# Patient Record
Sex: Female | Born: 1940 | Race: White | Hispanic: No | Smoking: Former smoker
Health system: Southern US, Community
[De-identification: ages and names within clinical notes are randomized; demographics above are authoritative.]

## PROBLEM LIST (undated history)

## (undated) DIAGNOSIS — J189 Pneumonia, unspecified organism: Secondary | ICD-10-CM

## (undated) DIAGNOSIS — Z9889 Other specified postprocedural states: Secondary | ICD-10-CM

## (undated) DIAGNOSIS — E669 Obesity, unspecified: Secondary | ICD-10-CM

## (undated) DIAGNOSIS — T4145XA Adverse effect of unspecified anesthetic, initial encounter: Secondary | ICD-10-CM

## (undated) DIAGNOSIS — I5030 Unspecified diastolic (congestive) heart failure: Secondary | ICD-10-CM

## (undated) DIAGNOSIS — R300 Dysuria: Principal | ICD-10-CM

## (undated) DIAGNOSIS — R112 Nausea with vomiting, unspecified: Secondary | ICD-10-CM

## (undated) DIAGNOSIS — R0602 Shortness of breath: Secondary | ICD-10-CM

## (undated) DIAGNOSIS — M7500 Adhesive capsulitis of unspecified shoulder: Secondary | ICD-10-CM

## (undated) DIAGNOSIS — E875 Hyperkalemia: Principal | ICD-10-CM

## (undated) DIAGNOSIS — D631 Anemia in chronic kidney disease: Secondary | ICD-10-CM

## (undated) DIAGNOSIS — R519 Headache, unspecified: Secondary | ICD-10-CM

## (undated) DIAGNOSIS — M353 Polymyalgia rheumatica: Secondary | ICD-10-CM

## (undated) DIAGNOSIS — N189 Chronic kidney disease, unspecified: Secondary | ICD-10-CM

## (undated) DIAGNOSIS — I639 Cerebral infarction, unspecified: Secondary | ICD-10-CM

## (undated) DIAGNOSIS — I2510 Atherosclerotic heart disease of native coronary artery without angina pectoris: Secondary | ICD-10-CM

## (undated) DIAGNOSIS — N184 Chronic kidney disease, stage 4 (severe): Secondary | ICD-10-CM

## (undated) DIAGNOSIS — E785 Hyperlipidemia, unspecified: Secondary | ICD-10-CM

## (undated) DIAGNOSIS — N179 Acute kidney failure, unspecified: Secondary | ICD-10-CM

## (undated) DIAGNOSIS — N289 Disorder of kidney and ureter, unspecified: Secondary | ICD-10-CM

## (undated) DIAGNOSIS — A419 Sepsis, unspecified organism: Secondary | ICD-10-CM

## (undated) DIAGNOSIS — R0603 Acute respiratory distress: Secondary | ICD-10-CM

## (undated) DIAGNOSIS — K219 Gastro-esophageal reflux disease without esophagitis: Secondary | ICD-10-CM

## (undated) DIAGNOSIS — F259 Schizoaffective disorder, unspecified: Secondary | ICD-10-CM

## (undated) DIAGNOSIS — R51 Headache: Secondary | ICD-10-CM

## (undated) DIAGNOSIS — I700 Atherosclerosis of aorta: Secondary | ICD-10-CM

## (undated) DIAGNOSIS — J449 Chronic obstructive pulmonary disease, unspecified: Secondary | ICD-10-CM

## (undated) DIAGNOSIS — E039 Hypothyroidism, unspecified: Secondary | ICD-10-CM

## (undated) DIAGNOSIS — K8689 Other specified diseases of pancreas: Secondary | ICD-10-CM

## (undated) DIAGNOSIS — D472 Monoclonal gammopathy: Secondary | ICD-10-CM

## (undated) DIAGNOSIS — T8859XA Other complications of anesthesia, initial encounter: Secondary | ICD-10-CM

## (undated) DIAGNOSIS — Y95 Nosocomial condition: Secondary | ICD-10-CM

## (undated) HISTORY — PX: CHOLECYSTECTOMY: SHX55

## (undated) HISTORY — DX: Gastro-esophageal reflux disease without esophagitis: K21.9

## (undated) HISTORY — DX: Nosocomial condition: Y95

## (undated) HISTORY — DX: Dysuria: R30.0

## (undated) HISTORY — DX: Chronic kidney disease, unspecified: N18.9

## (undated) HISTORY — DX: Acute respiratory distress: R06.03

## (undated) HISTORY — PX: CATARACT EXTRACTION: SUR2

## (undated) HISTORY — DX: Schizoaffective disorder, unspecified: F25.9

## (undated) HISTORY — DX: Atherosclerotic heart disease of native coronary artery without angina pectoris: I25.10

## (undated) HISTORY — DX: Anemia in chronic kidney disease: D63.1

## (undated) HISTORY — DX: Monoclonal gammopathy: D47.2

## (undated) HISTORY — DX: Pneumonia, unspecified organism: J18.9

## (undated) HISTORY — DX: Unspecified diastolic (congestive) heart failure: I50.30

## (undated) HISTORY — PX: ABDOMINAL HYSTERECTOMY: SHX81

## (undated) HISTORY — DX: Sepsis, unspecified organism: A41.9

## (undated) HISTORY — DX: Hyperlipidemia, unspecified: E78.5

## (undated) HISTORY — DX: Hyperkalemia: E87.5

## (undated) HISTORY — DX: Hypothyroidism, unspecified: E03.9

## (undated) HISTORY — DX: Acute kidney failure, unspecified: N17.9

## (undated) HISTORY — DX: Adhesive capsulitis of unspecified shoulder: M75.00

---

## 1993-06-14 HISTORY — PX: PARTIAL HYSTERECTOMY: SHX80

## 1998-02-28 ENCOUNTER — Encounter: Admission: RE | Admit: 1998-02-28 | Discharge: 1998-02-28 | Payer: Self-pay | Admitting: Family Medicine

## 1998-03-26 ENCOUNTER — Encounter: Admission: RE | Admit: 1998-03-26 | Discharge: 1998-03-26 | Payer: Self-pay | Admitting: Family Medicine

## 1998-06-03 ENCOUNTER — Encounter: Admission: RE | Admit: 1998-06-03 | Discharge: 1998-06-03 | Payer: Self-pay | Admitting: Sports Medicine

## 1998-07-03 ENCOUNTER — Encounter: Admission: RE | Admit: 1998-07-03 | Discharge: 1998-07-03 | Payer: Self-pay | Admitting: Family Medicine

## 1998-09-09 ENCOUNTER — Encounter: Admission: RE | Admit: 1998-09-09 | Discharge: 1998-09-09 | Payer: Self-pay | Admitting: Family Medicine

## 1998-12-26 ENCOUNTER — Encounter: Admission: RE | Admit: 1998-12-26 | Discharge: 1998-12-26 | Payer: Self-pay | Admitting: Family Medicine

## 1999-01-06 ENCOUNTER — Encounter: Admission: RE | Admit: 1999-01-06 | Discharge: 1999-01-06 | Payer: Self-pay | Admitting: Sports Medicine

## 1999-02-18 ENCOUNTER — Encounter: Admission: RE | Admit: 1999-02-18 | Discharge: 1999-02-18 | Payer: Self-pay | Admitting: Family Medicine

## 1999-04-22 ENCOUNTER — Encounter: Admission: RE | Admit: 1999-04-22 | Discharge: 1999-04-22 | Payer: Self-pay | Admitting: Family Medicine

## 1999-05-27 ENCOUNTER — Encounter: Admission: RE | Admit: 1999-05-27 | Discharge: 1999-05-27 | Payer: Self-pay | Admitting: Family Medicine

## 1999-09-22 ENCOUNTER — Encounter: Admission: RE | Admit: 1999-09-22 | Discharge: 1999-09-22 | Payer: Self-pay | Admitting: *Deleted

## 1999-09-22 ENCOUNTER — Encounter: Payer: Self-pay | Admitting: *Deleted

## 2000-03-14 ENCOUNTER — Encounter: Admission: RE | Admit: 2000-03-14 | Discharge: 2000-03-14 | Payer: Self-pay | Admitting: Family Medicine

## 2000-03-23 ENCOUNTER — Encounter: Admission: RE | Admit: 2000-03-23 | Discharge: 2000-03-23 | Payer: Self-pay | Admitting: Pediatrics

## 2000-07-20 ENCOUNTER — Encounter: Admission: RE | Admit: 2000-07-20 | Discharge: 2000-07-20 | Payer: Self-pay | Admitting: Family Medicine

## 2000-09-20 ENCOUNTER — Encounter: Admission: RE | Admit: 2000-09-20 | Discharge: 2000-09-20 | Payer: Self-pay | Admitting: Family Medicine

## 2000-10-19 ENCOUNTER — Encounter: Admission: RE | Admit: 2000-10-19 | Discharge: 2000-10-19 | Payer: Self-pay | Admitting: *Deleted

## 2000-10-19 ENCOUNTER — Encounter: Payer: Self-pay | Admitting: *Deleted

## 2000-11-25 ENCOUNTER — Encounter: Admission: RE | Admit: 2000-11-25 | Discharge: 2000-11-25 | Payer: Self-pay | Admitting: Family Medicine

## 2000-12-29 ENCOUNTER — Encounter: Admission: RE | Admit: 2000-12-29 | Discharge: 2000-12-29 | Payer: Self-pay | Admitting: Family Medicine

## 2001-01-17 ENCOUNTER — Encounter: Admission: RE | Admit: 2001-01-17 | Discharge: 2001-01-17 | Payer: Self-pay | Admitting: Family Medicine

## 2001-02-08 ENCOUNTER — Encounter: Admission: RE | Admit: 2001-02-08 | Discharge: 2001-02-08 | Payer: Self-pay | Admitting: Family Medicine

## 2001-03-02 ENCOUNTER — Encounter: Admission: RE | Admit: 2001-03-02 | Discharge: 2001-03-02 | Payer: Self-pay | Admitting: Family Medicine

## 2001-05-30 ENCOUNTER — Encounter: Admission: RE | Admit: 2001-05-30 | Discharge: 2001-05-30 | Payer: Self-pay | Admitting: Sports Medicine

## 2001-06-27 ENCOUNTER — Encounter: Admission: RE | Admit: 2001-06-27 | Discharge: 2001-06-27 | Payer: Self-pay | Admitting: Family Medicine

## 2001-08-23 ENCOUNTER — Inpatient Hospital Stay (HOSPITAL_COMMUNITY): Admission: EM | Admit: 2001-08-23 | Discharge: 2001-08-28 | Payer: Self-pay | Admitting: *Deleted

## 2001-08-29 ENCOUNTER — Encounter: Admission: RE | Admit: 2001-08-29 | Discharge: 2001-08-29 | Payer: Self-pay | Admitting: Sports Medicine

## 2001-09-27 ENCOUNTER — Encounter: Admission: RE | Admit: 2001-09-27 | Discharge: 2001-09-27 | Payer: Self-pay | Admitting: Family Medicine

## 2001-11-02 ENCOUNTER — Encounter: Admission: RE | Admit: 2001-11-02 | Discharge: 2001-11-02 | Payer: Self-pay | Admitting: Family Medicine

## 2002-02-05 ENCOUNTER — Encounter: Admission: RE | Admit: 2002-02-05 | Discharge: 2002-02-05 | Payer: Self-pay | Admitting: Family Medicine

## 2002-02-19 ENCOUNTER — Encounter: Payer: Self-pay | Admitting: Sports Medicine

## 2002-02-19 ENCOUNTER — Encounter: Admission: RE | Admit: 2002-02-19 | Discharge: 2002-02-19 | Payer: Self-pay | Admitting: Sports Medicine

## 2002-02-20 ENCOUNTER — Encounter: Admission: RE | Admit: 2002-02-20 | Discharge: 2002-02-20 | Payer: Self-pay | Admitting: Family Medicine

## 2002-02-26 ENCOUNTER — Encounter: Admission: RE | Admit: 2002-02-26 | Discharge: 2002-02-26 | Payer: Self-pay | Admitting: Family Medicine

## 2002-03-01 ENCOUNTER — Encounter: Admission: RE | Admit: 2002-03-01 | Discharge: 2002-03-01 | Payer: Self-pay | Admitting: Family Medicine

## 2002-03-02 ENCOUNTER — Ambulatory Visit (HOSPITAL_COMMUNITY): Admission: RE | Admit: 2002-03-02 | Discharge: 2002-03-02 | Payer: Self-pay | Admitting: *Deleted

## 2002-03-02 ENCOUNTER — Encounter: Payer: Self-pay | Admitting: *Deleted

## 2002-03-23 ENCOUNTER — Encounter: Admission: RE | Admit: 2002-03-23 | Discharge: 2002-03-23 | Payer: Self-pay | Admitting: Family Medicine

## 2002-04-11 ENCOUNTER — Encounter: Admission: RE | Admit: 2002-04-11 | Discharge: 2002-04-11 | Payer: Self-pay | Admitting: Sports Medicine

## 2002-04-19 ENCOUNTER — Encounter: Admission: RE | Admit: 2002-04-19 | Discharge: 2002-04-19 | Payer: Self-pay | Admitting: Sports Medicine

## 2002-04-24 ENCOUNTER — Encounter: Admission: RE | Admit: 2002-04-24 | Discharge: 2002-04-24 | Payer: Self-pay | Admitting: Family Medicine

## 2002-05-02 ENCOUNTER — Encounter: Admission: RE | Admit: 2002-05-02 | Discharge: 2002-05-02 | Payer: Self-pay | Admitting: Family Medicine

## 2002-05-15 ENCOUNTER — Encounter: Admission: RE | Admit: 2002-05-15 | Discharge: 2002-05-15 | Payer: Self-pay | Admitting: Family Medicine

## 2002-05-18 ENCOUNTER — Encounter: Admission: RE | Admit: 2002-05-18 | Discharge: 2002-05-18 | Payer: Self-pay | Admitting: Family Medicine

## 2002-05-21 ENCOUNTER — Encounter: Admission: RE | Admit: 2002-05-21 | Discharge: 2002-05-21 | Payer: Self-pay | Admitting: Sports Medicine

## 2002-05-21 ENCOUNTER — Encounter: Payer: Self-pay | Admitting: Sports Medicine

## 2002-05-21 ENCOUNTER — Inpatient Hospital Stay (HOSPITAL_COMMUNITY): Admission: AD | Admit: 2002-05-21 | Discharge: 2002-05-25 | Payer: Self-pay | Admitting: Surgery

## 2002-05-21 ENCOUNTER — Encounter: Payer: Self-pay | Admitting: Surgery

## 2002-05-22 ENCOUNTER — Encounter (INDEPENDENT_AMBULATORY_CARE_PROVIDER_SITE_OTHER): Payer: Self-pay | Admitting: Specialist

## 2002-05-30 ENCOUNTER — Encounter: Admission: RE | Admit: 2002-05-30 | Discharge: 2002-05-30 | Payer: Self-pay | Admitting: Family Medicine

## 2002-06-16 ENCOUNTER — Encounter: Payer: Self-pay | Admitting: General Surgery

## 2002-06-16 ENCOUNTER — Inpatient Hospital Stay (HOSPITAL_COMMUNITY): Admission: EM | Admit: 2002-06-16 | Discharge: 2002-06-22 | Payer: Self-pay | Admitting: *Deleted

## 2002-06-16 ENCOUNTER — Encounter: Payer: Self-pay | Admitting: *Deleted

## 2002-06-19 ENCOUNTER — Encounter: Payer: Self-pay | Admitting: Surgery

## 2002-06-27 ENCOUNTER — Encounter: Admission: RE | Admit: 2002-06-27 | Discharge: 2002-06-27 | Payer: Self-pay | Admitting: Family Medicine

## 2002-06-28 ENCOUNTER — Encounter: Admission: RE | Admit: 2002-06-28 | Discharge: 2002-06-28 | Payer: Self-pay | Admitting: Family Medicine

## 2002-07-02 ENCOUNTER — Encounter: Admission: RE | Admit: 2002-07-02 | Discharge: 2002-07-02 | Payer: Self-pay | Admitting: Family Medicine

## 2002-08-15 ENCOUNTER — Encounter: Admission: RE | Admit: 2002-08-15 | Discharge: 2002-08-15 | Payer: Self-pay | Admitting: Family Medicine

## 2002-09-12 ENCOUNTER — Encounter: Admission: RE | Admit: 2002-09-12 | Discharge: 2002-09-12 | Payer: Self-pay | Admitting: Family Medicine

## 2002-10-03 ENCOUNTER — Encounter: Admission: RE | Admit: 2002-10-03 | Discharge: 2002-10-03 | Payer: Self-pay | Admitting: Family Medicine

## 2003-01-17 ENCOUNTER — Encounter: Admission: RE | Admit: 2003-01-17 | Discharge: 2003-01-17 | Payer: Self-pay | Admitting: Family Medicine

## 2003-02-26 ENCOUNTER — Encounter: Admission: RE | Admit: 2003-02-26 | Discharge: 2003-02-26 | Payer: Self-pay | Admitting: Sports Medicine

## 2003-04-03 ENCOUNTER — Encounter: Admission: RE | Admit: 2003-04-03 | Discharge: 2003-04-03 | Payer: Self-pay | Admitting: Sports Medicine

## 2003-04-03 ENCOUNTER — Encounter: Payer: Self-pay | Admitting: Sports Medicine

## 2003-04-04 ENCOUNTER — Encounter: Admission: RE | Admit: 2003-04-04 | Discharge: 2003-04-04 | Payer: Self-pay | Admitting: Family Medicine

## 2003-04-24 ENCOUNTER — Encounter: Admission: RE | Admit: 2003-04-24 | Discharge: 2003-04-24 | Payer: Self-pay | Admitting: Family Medicine

## 2003-04-26 ENCOUNTER — Encounter: Admission: RE | Admit: 2003-04-26 | Discharge: 2003-04-26 | Payer: Self-pay | Admitting: Family Medicine

## 2003-05-03 ENCOUNTER — Encounter: Admission: RE | Admit: 2003-05-03 | Discharge: 2003-05-03 | Payer: Self-pay | Admitting: Sports Medicine

## 2003-05-07 ENCOUNTER — Encounter: Admission: RE | Admit: 2003-05-07 | Discharge: 2003-05-07 | Payer: Self-pay | Admitting: Family Medicine

## 2003-06-12 ENCOUNTER — Encounter: Admission: RE | Admit: 2003-06-12 | Discharge: 2003-06-12 | Payer: Self-pay | Admitting: Family Medicine

## 2003-07-12 ENCOUNTER — Encounter: Admission: RE | Admit: 2003-07-12 | Discharge: 2003-07-12 | Payer: Self-pay | Admitting: Sports Medicine

## 2003-07-12 ENCOUNTER — Ambulatory Visit (HOSPITAL_COMMUNITY): Admission: RE | Admit: 2003-07-12 | Discharge: 2003-07-12 | Payer: Self-pay | Admitting: Sports Medicine

## 2003-07-23 ENCOUNTER — Encounter: Admission: RE | Admit: 2003-07-23 | Discharge: 2003-07-23 | Payer: Self-pay | Admitting: Family Medicine

## 2003-09-20 ENCOUNTER — Encounter: Admission: RE | Admit: 2003-09-20 | Discharge: 2003-09-20 | Payer: Self-pay | Admitting: Family Medicine

## 2003-10-05 ENCOUNTER — Emergency Department (HOSPITAL_COMMUNITY): Admission: EM | Admit: 2003-10-05 | Discharge: 2003-10-05 | Payer: Self-pay | Admitting: Emergency Medicine

## 2003-10-08 ENCOUNTER — Encounter: Admission: RE | Admit: 2003-10-08 | Discharge: 2003-10-08 | Payer: Self-pay | Admitting: Sports Medicine

## 2003-10-09 ENCOUNTER — Encounter: Admission: RE | Admit: 2003-10-09 | Discharge: 2003-10-09 | Payer: Self-pay | Admitting: Family Medicine

## 2003-10-11 ENCOUNTER — Encounter: Admission: RE | Admit: 2003-10-11 | Discharge: 2003-10-11 | Payer: Self-pay | Admitting: Family Medicine

## 2003-10-11 ENCOUNTER — Encounter: Admission: RE | Admit: 2003-10-11 | Discharge: 2003-10-11 | Payer: Self-pay | Admitting: Sports Medicine

## 2003-11-07 ENCOUNTER — Encounter: Admission: RE | Admit: 2003-11-07 | Discharge: 2003-11-07 | Payer: Self-pay | Admitting: Family Medicine

## 2004-02-27 ENCOUNTER — Ambulatory Visit: Payer: Self-pay | Admitting: Family Medicine

## 2004-04-06 ENCOUNTER — Encounter: Admission: RE | Admit: 2004-04-06 | Discharge: 2004-04-06 | Payer: Self-pay | Admitting: Sports Medicine

## 2004-04-28 ENCOUNTER — Ambulatory Visit: Payer: Self-pay | Admitting: Family Medicine

## 2004-05-11 ENCOUNTER — Encounter: Admission: RE | Admit: 2004-05-11 | Discharge: 2004-05-19 | Payer: Self-pay | Admitting: Family Medicine

## 2004-05-16 IMAGING — CT CT ABDOMEN W/O CM
1 series · 15 of 32 positions shown, 19 images · non-contrast
Comparison: none

FINDINGS
CLINICAL DATA: VOMITING, ABDOMINAL PAIN.  STATUS POST EMERGENT CHOLECYSTECTOMY.
CT SCAN OF THE ABDOMEN, WITHOUT CONTRAST
SPIRAL SCANNING IS PERFORMED AFTER DILUTE GASTROINTESTINAL CONTRAST WAS ADMINISTERED  PER THE NG
TUBE.  NO IV CONTRAST WAS UTILIZED.
LUNG BASES SHOW MINIMAL DEPENDENT ATELECTASIS. NO PLEURAL OR PERICARDIAL  FLUID. LIVER PARENCHYMA
APPEARS NORMAL. THE PATIENT HAS HAD CHOLECYSTECTOMY IN THE RECENT PAST.  THERE IS SOME MATERIAL IN
THE GALLBLADDER BED CONSISTENT WITH RESOLVING POST-OPERATIVE CHANGE.  NO FREE FLUID OR AIR IN THE
PERONEAL SPACE.  THE SPLEEN, PANCREAS, AND ADRENAL GLANDS ARE NORMAL. THE KIDNEYS SHOW NO FOCAL
LESION IN THE UNCONTRASTED STATE.  THE SMALL BOWEL IS DILATED AND FLUID FILLED. THERE IS ALSO A
GOOD BIT OF FLUID AND AIR WITHIN THE COLON.  THE DISTAL SMALL BOWEL DOES IN FACT HAVE A MUCH
SMALLER CALIBER.  THIS FINDING CAN BE SEEN IN ILEUS BUT DOES RAISE CONCERN ABOUT THE POSSIBILITY OF
A PARTIAL SMALL BOWEL OBSTRUCTION.
IMPRESSION
1.  DILATED PROXIMAL SMALL BOWEL WITH A NORMAL CALIBER DISTAL SMALL BOWEL.  WHEREAS THIS FINDING
CAN OCCASIONALLY BE SEEN IN ILEUS, IT RAISES CONCERN ABOUT PARTIAL SMALL BOWEL OBSTRUCTION.
CT PELVIS,  WITHOUT CONTRAST
5 MM SCANS ARE MADE AFTER GASTROINTESTINAL CONTRAST BUT WITHOUT INTRAVENOUS CONTRAST.
THERE IS NO FREE FLUID.  THERE IS A FOLEY CATHETER IN THE BLADDER.  ONE CAN APPRECIATE DECOMPRESSED
DISTAL COLON AND SMALL CALIBER DISTAL SMALL BOWEL.
1.  NEGATIVE CT SCAN OF THE PELVIS.

[Series 2: abd pelvis · axial · 0.70mm/px · z∈[-475,-70]mm · 15 of 90 slices shown, 19 images]
[im 6/90  soft-tissue]
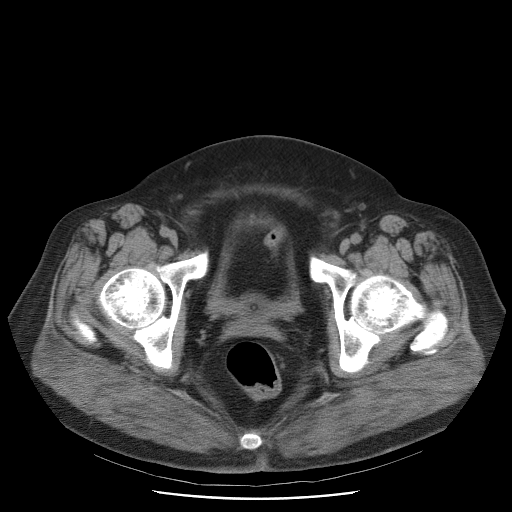
[im 6/90  bone]
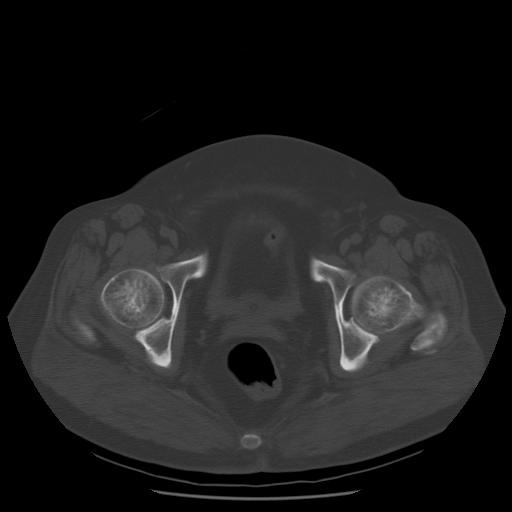
[im 12/90  soft-tissue]
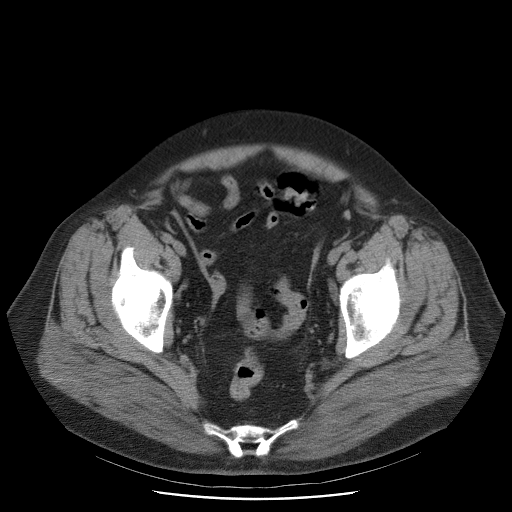
[im 18/90  soft-tissue]
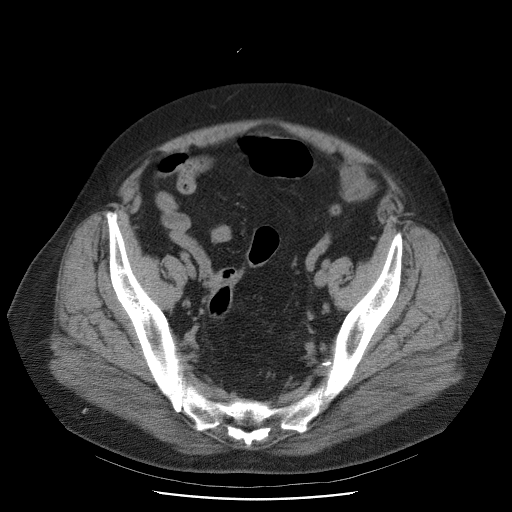
[im 26/90  soft-tissue]
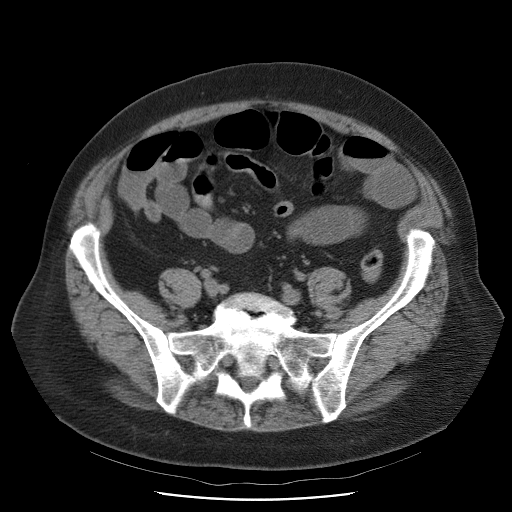
[im 32/90  soft-tissue]
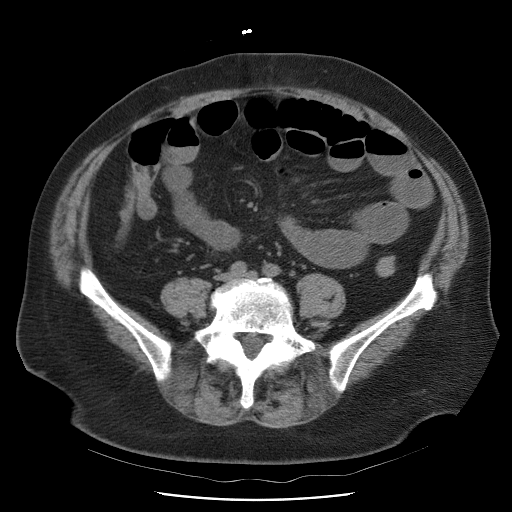
[im 38/90  soft-tissue]
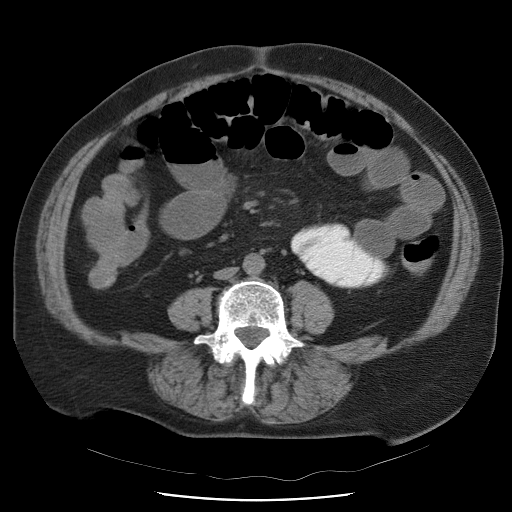
[im 46/90  soft-tissue]
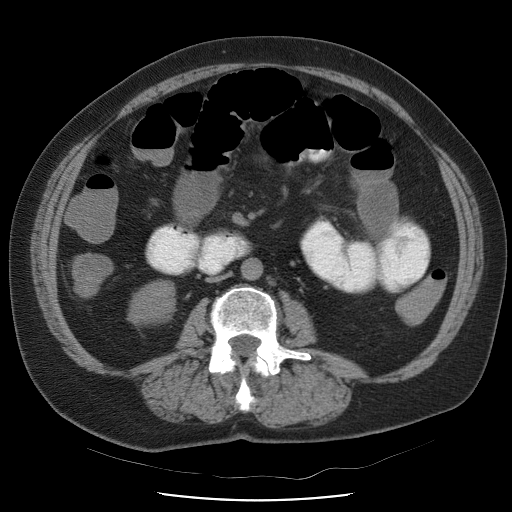
[im 52/90  soft-tissue]
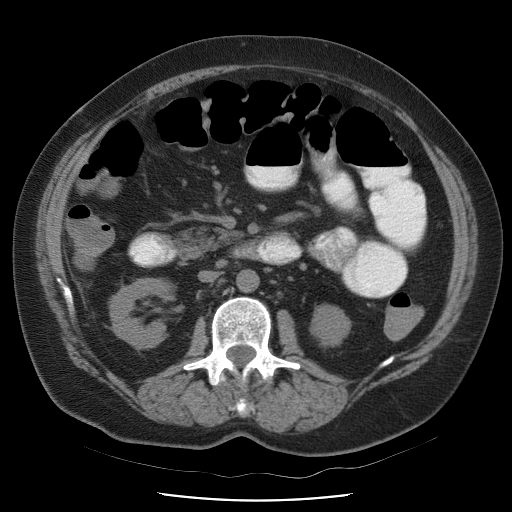
[im 58/90  soft-tissue]
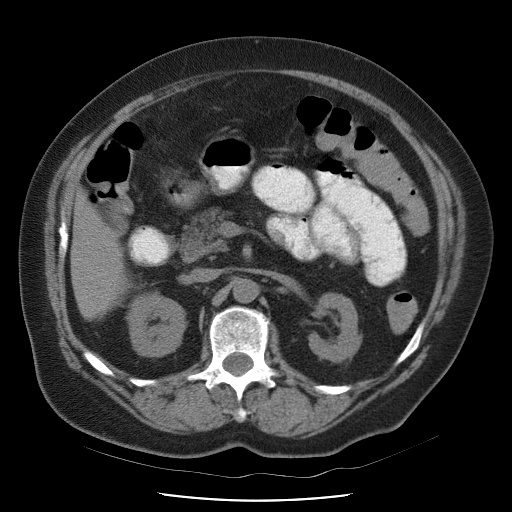
[im 58/90  bone]
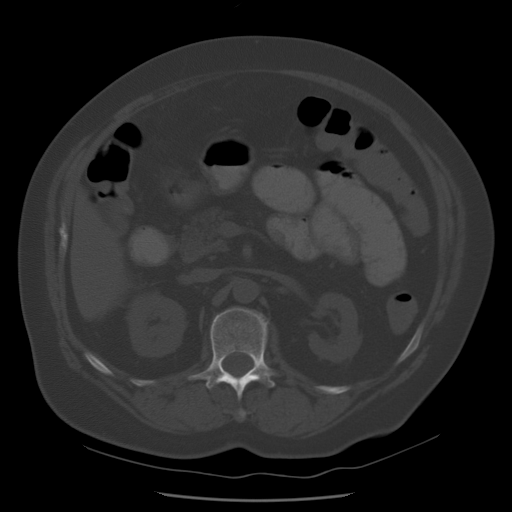
[im 64/90  soft-tissue]
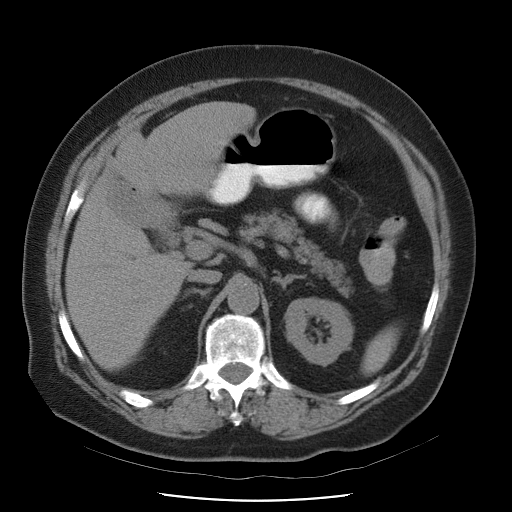
[im 72/90  soft-tissue]
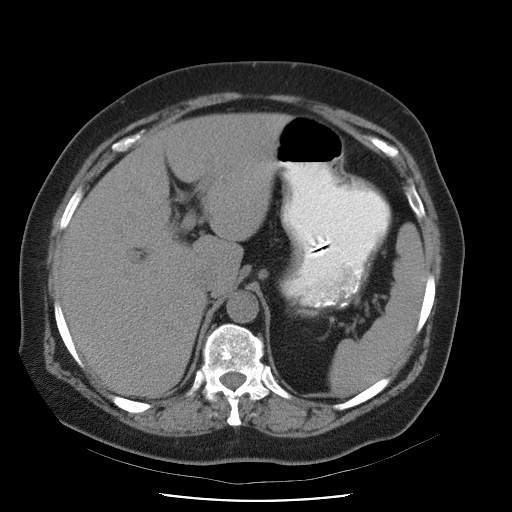
[im 78/90  soft-tissue]
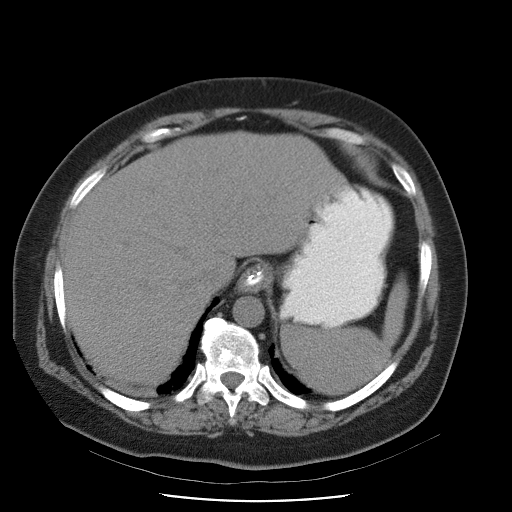
[im 78/90  lung]
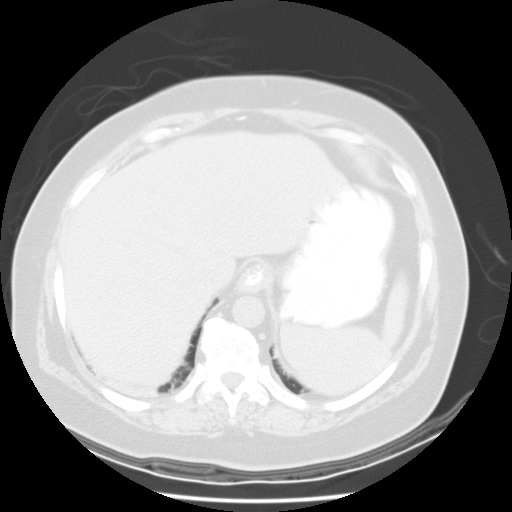
[im 81/90  lung]
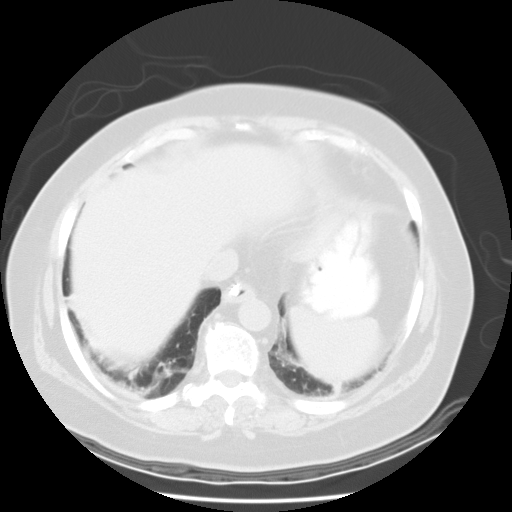
[im 84/90  soft-tissue]
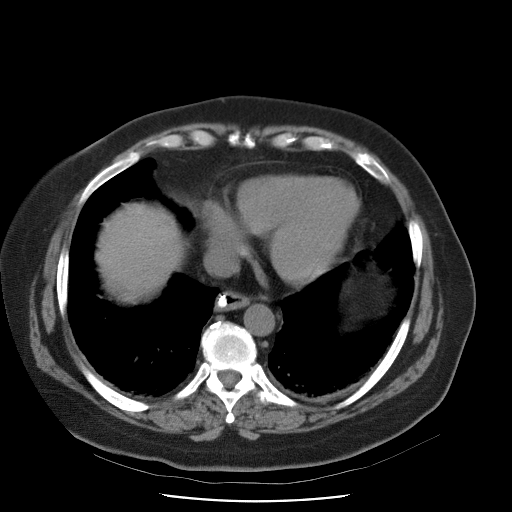
[im 84/90  lung]
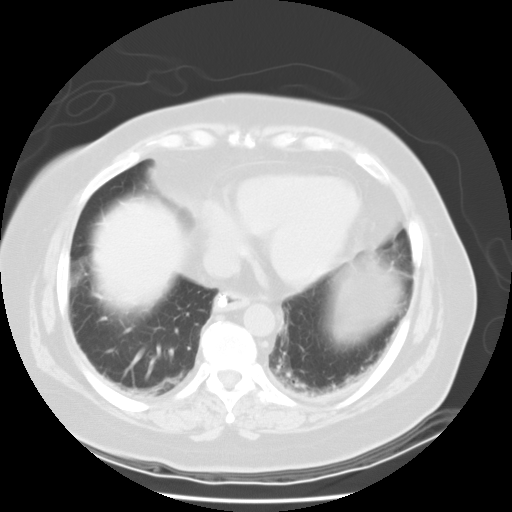
[im 87/90  lung]
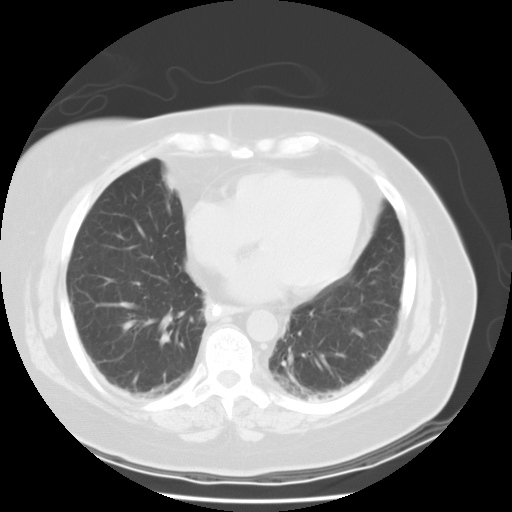

[15 of 32 positions shown; findings below may reference images not displayed]

## 2004-06-01 ENCOUNTER — Ambulatory Visit: Payer: Self-pay | Admitting: Sports Medicine

## 2004-06-14 DIAGNOSIS — I2510 Atherosclerotic heart disease of native coronary artery without angina pectoris: Secondary | ICD-10-CM

## 2004-06-14 HISTORY — DX: Atherosclerotic heart disease of native coronary artery without angina pectoris: I25.10

## 2004-06-14 HISTORY — PX: CORONARY ANGIOPLASTY WITH STENT PLACEMENT: SHX49

## 2004-08-18 ENCOUNTER — Ambulatory Visit: Payer: Self-pay | Admitting: Family Medicine

## 2004-09-11 ENCOUNTER — Ambulatory Visit: Payer: Self-pay | Admitting: Family Medicine

## 2004-10-22 ENCOUNTER — Ambulatory Visit (HOSPITAL_COMMUNITY): Admission: RE | Admit: 2004-10-22 | Discharge: 2004-10-22 | Payer: Self-pay | Admitting: Family Medicine

## 2004-10-22 ENCOUNTER — Encounter (INDEPENDENT_AMBULATORY_CARE_PROVIDER_SITE_OTHER): Payer: Self-pay | Admitting: Specialist

## 2004-12-18 ENCOUNTER — Ambulatory Visit: Payer: Self-pay | Admitting: Family Medicine

## 2004-12-28 ENCOUNTER — Ambulatory Visit: Payer: Self-pay | Admitting: Sports Medicine

## 2004-12-28 ENCOUNTER — Ambulatory Visit (HOSPITAL_COMMUNITY): Admission: RE | Admit: 2004-12-28 | Discharge: 2004-12-28 | Payer: Self-pay | Admitting: Sports Medicine

## 2004-12-30 ENCOUNTER — Ambulatory Visit: Payer: Self-pay | Admitting: Family Medicine

## 2005-01-06 ENCOUNTER — Ambulatory Visit: Payer: Self-pay | Admitting: Sports Medicine

## 2005-02-08 ENCOUNTER — Inpatient Hospital Stay (HOSPITAL_COMMUNITY): Admission: AD | Admit: 2005-02-08 | Discharge: 2005-02-10 | Payer: Self-pay | Admitting: *Deleted

## 2005-02-13 ENCOUNTER — Emergency Department (HOSPITAL_COMMUNITY): Admission: EM | Admit: 2005-02-13 | Discharge: 2005-02-13 | Payer: Self-pay | Admitting: Emergency Medicine

## 2005-03-16 ENCOUNTER — Ambulatory Visit: Payer: Self-pay | Admitting: Family Medicine

## 2005-03-24 ENCOUNTER — Ambulatory Visit: Payer: Self-pay | Admitting: Family Medicine

## 2005-03-25 ENCOUNTER — Encounter: Admission: RE | Admit: 2005-03-25 | Discharge: 2005-03-25 | Payer: Self-pay | Admitting: Family Medicine

## 2005-03-25 ENCOUNTER — Ambulatory Visit: Payer: Self-pay | Admitting: Sports Medicine

## 2005-04-28 ENCOUNTER — Ambulatory Visit: Payer: Self-pay | Admitting: Family Medicine

## 2005-08-05 ENCOUNTER — Encounter: Admission: RE | Admit: 2005-08-05 | Discharge: 2005-08-05 | Payer: Self-pay | Admitting: Family Medicine

## 2005-09-10 IMAGING — US US ABDOMEN COMPLETE
1 series · 14 of 25 positions shown · non-contrast
Comparison: none

CLINICAL DATA: Right upper quadrant pain.  Urinary retention. 
 COMPLETE ABDOMINAL ULTRASOUND: 
 The gallbladder has been removed.  Common bile duct measures 6.3mm in maximum diameter, within normal limits for a post cholecystectomy patient.  There is slight increased echogenicity of the liver parenchyma diffusely suggesting mild fatty infiltration.  The inferior vena cava, pancreas, spleen, kidneys, and abdominal aorta all appear normal.  Right kidney is 10.9cm in length and the left kidney is 10.9cm in length.  
 The bladder measured 7.9 x 4.1 x 8.7cm consistent with 600cc.  On the post voiding study the bladder was completely empty.

[Series 1: unknown · 0.27mm/px · 14 of 66 slices shown]
[im 1/66]
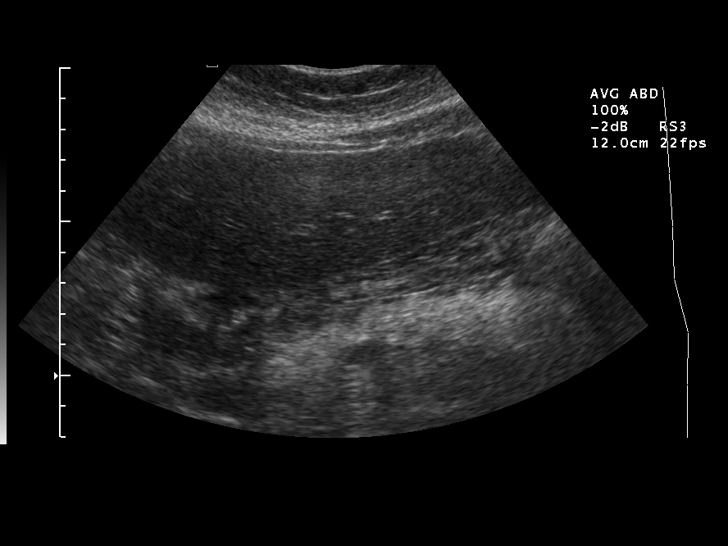
[im 6/66]
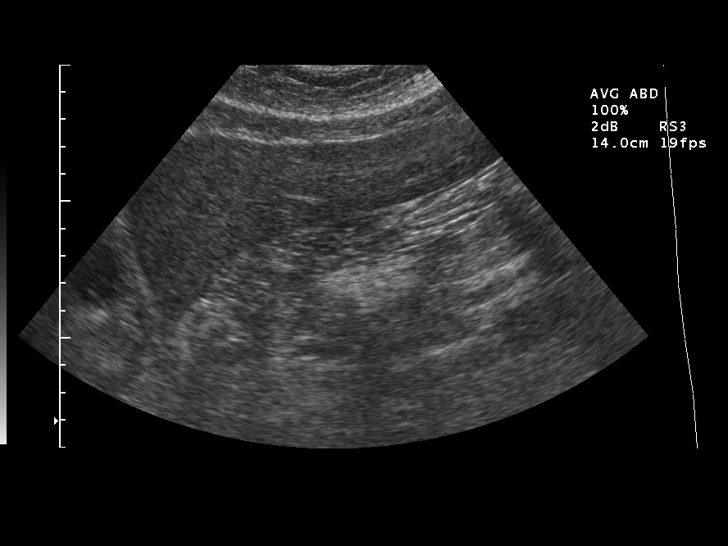
[im 11/66]
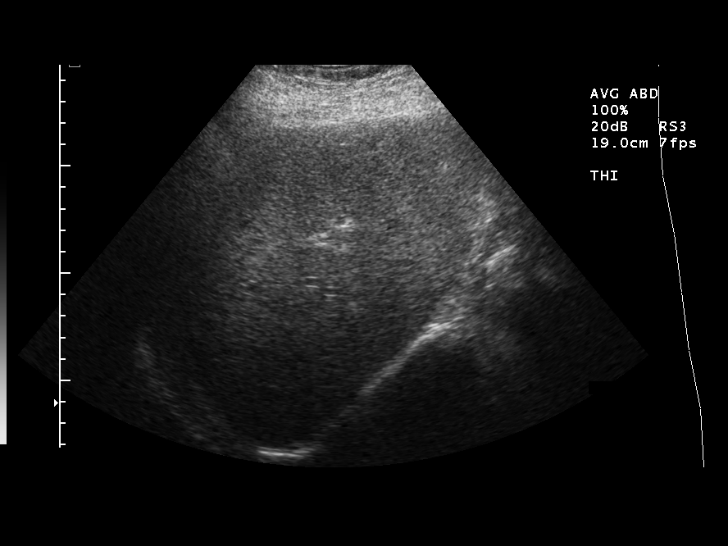
[im 17/66]
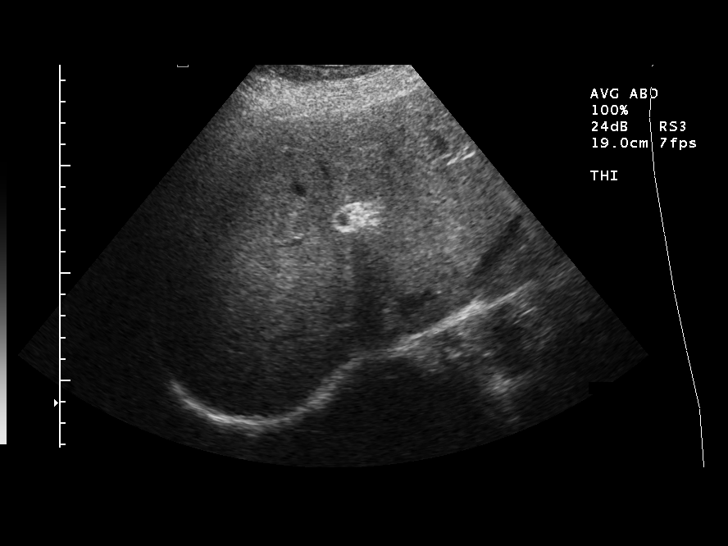
[im 22/66]
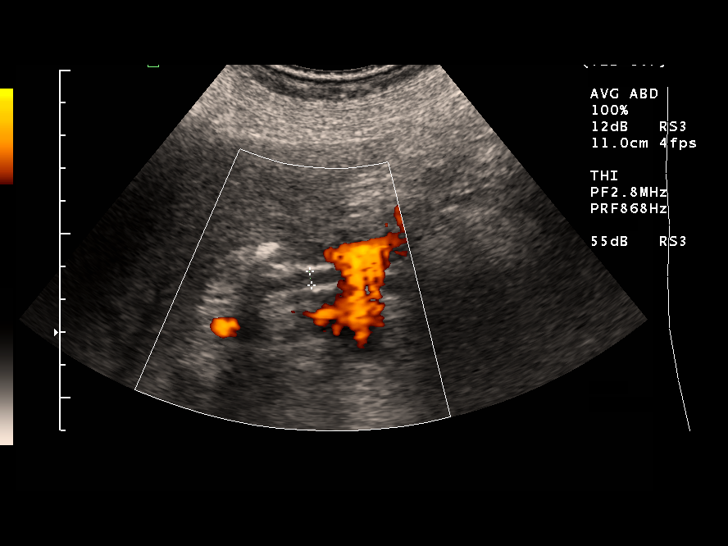
[im 25/66]
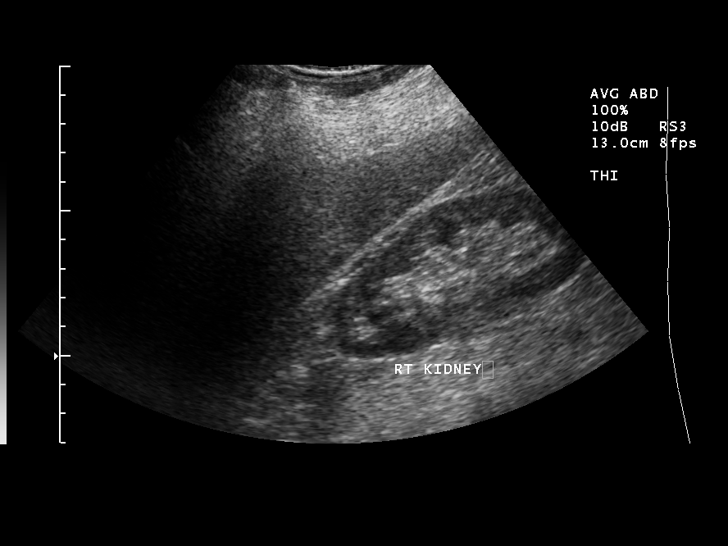
[im 30/66]
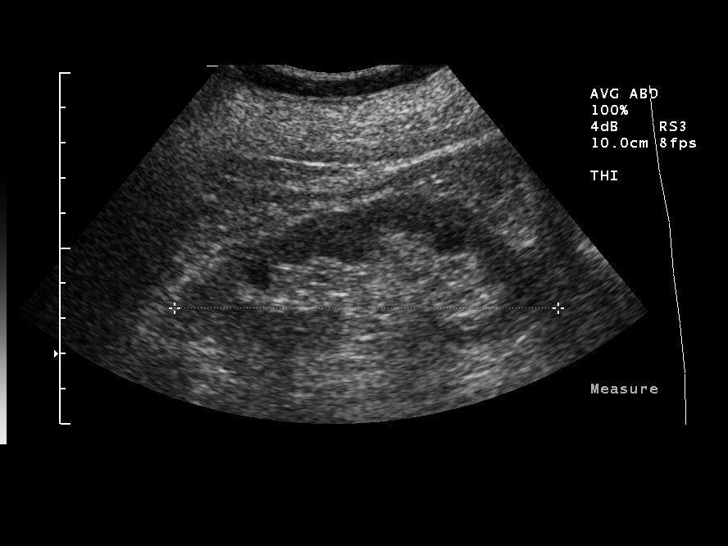
[im 36/66]
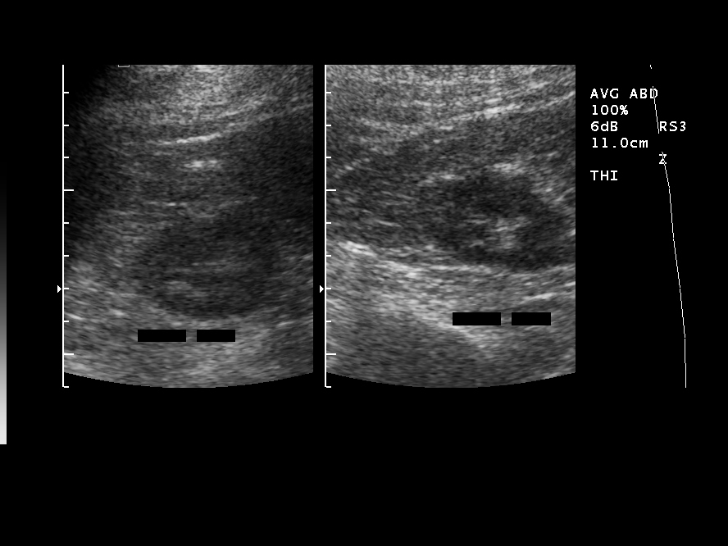
[im 41/66]
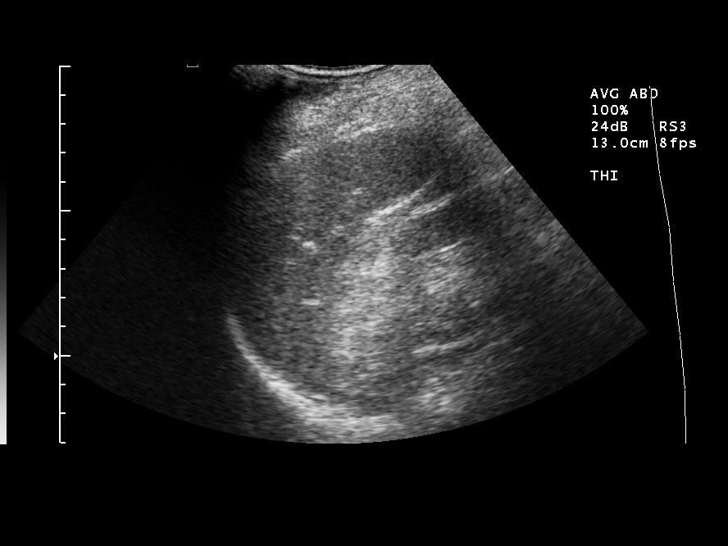
[im 44/66]
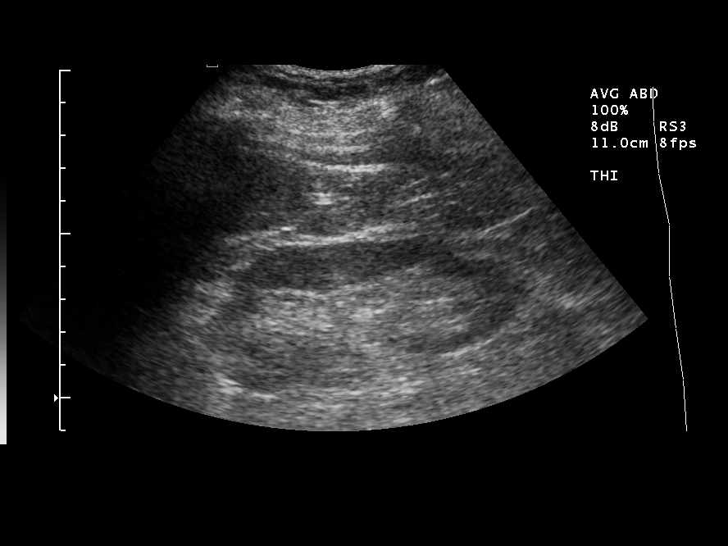
[im 49/66]
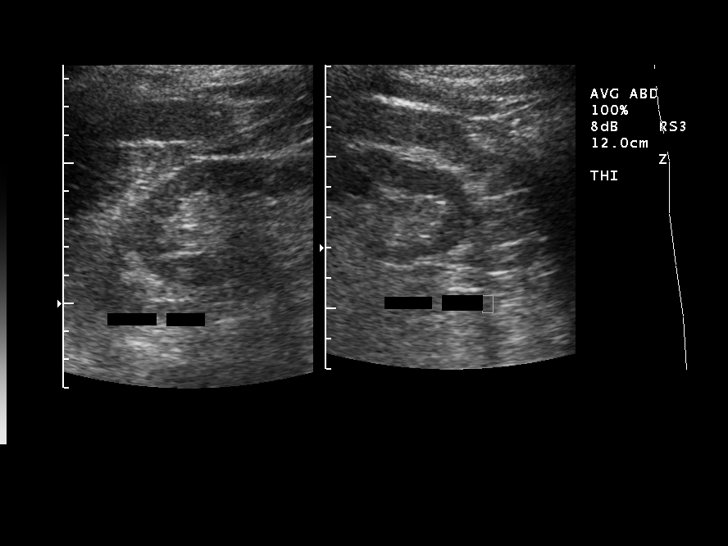
[im 55/66]
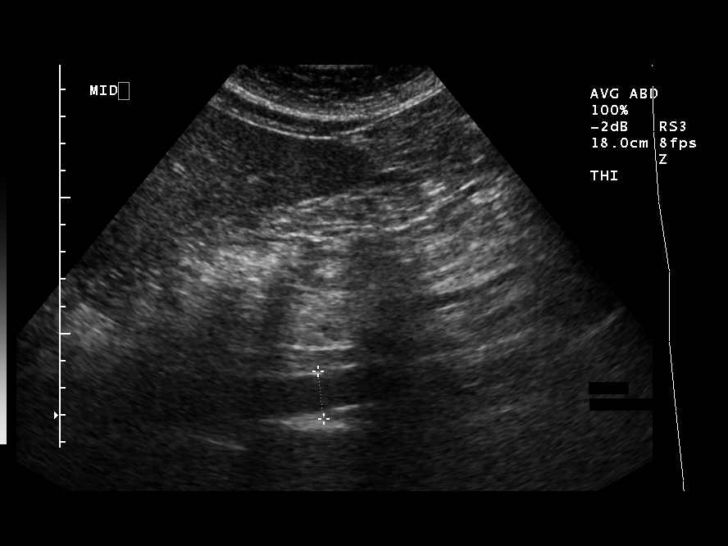
[im 60/66]
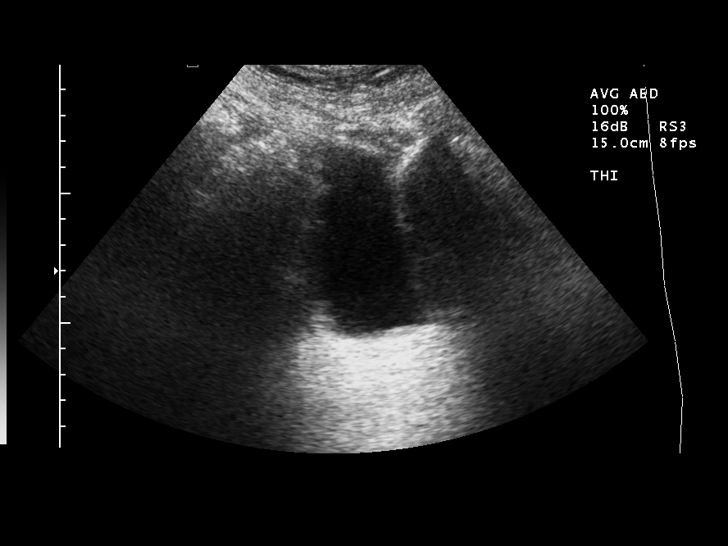
[im 66/66]
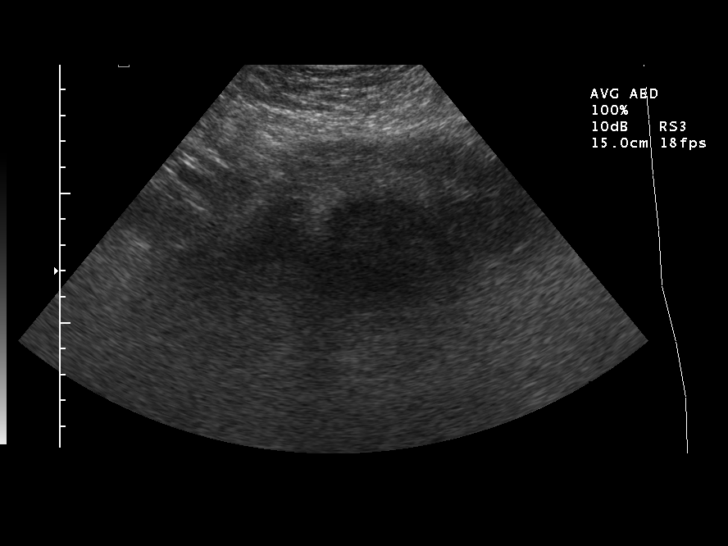

[14 of 25 positions shown; findings below may reference images not displayed]

IMPRESSION: 1.  Slightly fatty liver.  Previous cholecystectomy. 
 2.  No appreciable residual urine in the bladder after voiding.

## 2005-09-17 ENCOUNTER — Encounter: Admission: RE | Admit: 2005-09-17 | Discharge: 2005-09-17 | Payer: Self-pay | Admitting: Sports Medicine

## 2005-09-24 ENCOUNTER — Ambulatory Visit: Payer: Self-pay | Admitting: Family Medicine

## 2005-10-22 ENCOUNTER — Ambulatory Visit: Payer: Self-pay | Admitting: Family Medicine

## 2006-01-05 ENCOUNTER — Ambulatory Visit: Payer: Self-pay | Admitting: Sports Medicine

## 2006-01-05 ENCOUNTER — Ambulatory Visit (HOSPITAL_COMMUNITY): Admission: RE | Admit: 2006-01-05 | Discharge: 2006-01-05 | Payer: Self-pay | Admitting: Family Medicine

## 2006-01-26 ENCOUNTER — Ambulatory Visit: Payer: Self-pay

## 2006-03-10 ENCOUNTER — Ambulatory Visit: Payer: Self-pay | Admitting: Sports Medicine

## 2006-03-15 ENCOUNTER — Ambulatory Visit: Payer: Self-pay | Admitting: Family Medicine

## 2006-05-24 ENCOUNTER — Ambulatory Visit: Payer: Self-pay | Admitting: Sports Medicine

## 2006-07-20 ENCOUNTER — Ambulatory Visit: Payer: Self-pay | Admitting: Internal Medicine

## 2006-07-20 ENCOUNTER — Inpatient Hospital Stay (HOSPITAL_COMMUNITY): Admission: EM | Admit: 2006-07-20 | Discharge: 2006-08-03 | Payer: Self-pay | Admitting: Emergency Medicine

## 2006-07-29 ENCOUNTER — Ambulatory Visit: Payer: Self-pay | Admitting: Physical Medicine & Rehabilitation

## 2006-08-09 ENCOUNTER — Ambulatory Visit: Payer: Self-pay | Admitting: Family Medicine

## 2006-08-09 ENCOUNTER — Encounter (INDEPENDENT_AMBULATORY_CARE_PROVIDER_SITE_OTHER): Payer: Self-pay | Admitting: Family Medicine

## 2006-08-09 LAB — CONVERTED CEMR LAB
AST: 33 units/L (ref 0–37)
Alkaline Phosphatase: 90 units/L (ref 39–117)
CO2: 19 meq/L (ref 19–32)
Calcium: 9.5 mg/dL (ref 8.4–10.5)
Creatinine, Ser: 2.2 mg/dL — ABNORMAL HIGH (ref 0.40–1.20)
Glucose, Bld: 108 mg/dL — ABNORMAL HIGH (ref 70–99)
Potassium: 5.2 meq/L (ref 3.5–5.3)
Total CK: 293 units/L — ABNORMAL HIGH (ref 7–177)

## 2006-08-11 DIAGNOSIS — N189 Chronic kidney disease, unspecified: Secondary | ICD-10-CM

## 2006-08-11 DIAGNOSIS — E039 Hypothyroidism, unspecified: Secondary | ICD-10-CM | POA: Insufficient documentation

## 2006-08-11 DIAGNOSIS — E1165 Type 2 diabetes mellitus with hyperglycemia: Secondary | ICD-10-CM

## 2006-08-11 DIAGNOSIS — E118 Type 2 diabetes mellitus with unspecified complications: Secondary | ICD-10-CM

## 2006-08-11 DIAGNOSIS — K219 Gastro-esophageal reflux disease without esophagitis: Secondary | ICD-10-CM

## 2006-08-11 DIAGNOSIS — D631 Anemia in chronic kidney disease: Secondary | ICD-10-CM | POA: Insufficient documentation

## 2006-08-16 ENCOUNTER — Telehealth (INDEPENDENT_AMBULATORY_CARE_PROVIDER_SITE_OTHER): Payer: Self-pay | Admitting: *Deleted

## 2006-08-17 ENCOUNTER — Encounter (HOSPITAL_COMMUNITY): Admission: RE | Admit: 2006-08-17 | Discharge: 2006-11-15 | Payer: Self-pay | Admitting: Family Medicine

## 2006-08-17 ENCOUNTER — Telehealth: Payer: Self-pay | Admitting: *Deleted

## 2006-08-18 ENCOUNTER — Ambulatory Visit: Payer: Self-pay | Admitting: Sports Medicine

## 2006-08-18 ENCOUNTER — Encounter: Payer: Self-pay | Admitting: Family Medicine

## 2006-08-18 LAB — CONVERTED CEMR LAB
Albumin: 4.2 g/dL (ref 3.5–5.2)
Blood in Urine, dipstick: NEGATIVE
CO2: 16 meq/L — ABNORMAL LOW (ref 19–32)
Calcium: 9.5 mg/dL (ref 8.4–10.5)
Potassium: 4.9 meq/L (ref 3.5–5.3)
Total CK: 238 units/L — ABNORMAL HIGH (ref 7–177)
Urobilinogen, UA: 0.2
pH: 5.5

## 2006-09-23 ENCOUNTER — Encounter (INDEPENDENT_AMBULATORY_CARE_PROVIDER_SITE_OTHER): Payer: Self-pay | Admitting: Family Medicine

## 2006-09-23 ENCOUNTER — Ambulatory Visit: Payer: Self-pay | Admitting: Family Medicine

## 2006-09-23 LAB — CONVERTED CEMR LAB: TSH: 2.272 microintl units/mL (ref 0.350–5.50)

## 2006-09-29 ENCOUNTER — Encounter: Admission: RE | Admit: 2006-09-29 | Discharge: 2006-09-29 | Payer: Self-pay | Admitting: Sports Medicine

## 2006-09-29 ENCOUNTER — Encounter (INDEPENDENT_AMBULATORY_CARE_PROVIDER_SITE_OTHER): Payer: Self-pay | Admitting: Family Medicine

## 2006-10-04 ENCOUNTER — Ambulatory Visit: Payer: Self-pay | Admitting: Family Medicine

## 2006-10-04 ENCOUNTER — Telehealth: Payer: Self-pay | Admitting: *Deleted

## 2006-10-06 ENCOUNTER — Telehealth (INDEPENDENT_AMBULATORY_CARE_PROVIDER_SITE_OTHER): Payer: Self-pay | Admitting: *Deleted

## 2006-10-10 ENCOUNTER — Ambulatory Visit: Payer: Self-pay | Admitting: Family Medicine

## 2006-10-10 ENCOUNTER — Telehealth: Payer: Self-pay | Admitting: *Deleted

## 2006-10-10 ENCOUNTER — Encounter (INDEPENDENT_AMBULATORY_CARE_PROVIDER_SITE_OTHER): Payer: Self-pay | Admitting: Family Medicine

## 2006-10-10 LAB — CONVERTED CEMR LAB
ALT: 19 units/L (ref 0–35)
Albumin: 4 g/dL (ref 3.5–5.2)
Alkaline Phosphatase: 84 units/L (ref 39–117)
BUN: 29 mg/dL — ABNORMAL HIGH (ref 6–23)
CO2: 21 meq/L (ref 19–32)
Creatinine, Ser: 1.36 mg/dL — ABNORMAL HIGH (ref 0.40–1.20)
Glucose, Bld: 98 mg/dL (ref 70–99)
Magnesium: 1.5 mg/dL (ref 1.5–2.5)
Phosphorus: 3.7 mg/dL (ref 2.3–4.6)
Total Protein: 6.8 g/dL (ref 6.0–8.3)

## 2006-10-11 ENCOUNTER — Telehealth (INDEPENDENT_AMBULATORY_CARE_PROVIDER_SITE_OTHER): Payer: Self-pay | Admitting: Family Medicine

## 2006-10-12 ENCOUNTER — Encounter (INDEPENDENT_AMBULATORY_CARE_PROVIDER_SITE_OTHER): Payer: Self-pay | Admitting: Family Medicine

## 2006-12-01 ENCOUNTER — Ambulatory Visit: Payer: Self-pay | Admitting: Family Medicine

## 2007-01-10 ENCOUNTER — Telehealth (INDEPENDENT_AMBULATORY_CARE_PROVIDER_SITE_OTHER): Payer: Self-pay | Admitting: *Deleted

## 2007-01-25 ENCOUNTER — Ambulatory Visit: Payer: Self-pay | Admitting: Family Medicine

## 2007-01-25 DIAGNOSIS — E785 Hyperlipidemia, unspecified: Secondary | ICD-10-CM | POA: Insufficient documentation

## 2007-01-25 LAB — CONVERTED CEMR LAB: Hgb A1c MFr Bld: 5.7 %

## 2007-02-23 IMAGING — CR DG CHEST 2V
2 series · 2 of 2 positions shown · non-contrast
Comparison: none

CLINICAL DATA: Cough.  Short of breath.
 CHEST ? TWO VIEWS:
 Two views of the chest show no pneumonia or effusion.  The heart is within the upper limits of normal in size.  There are degenerative changes in both shoulders.

[w chest pa]
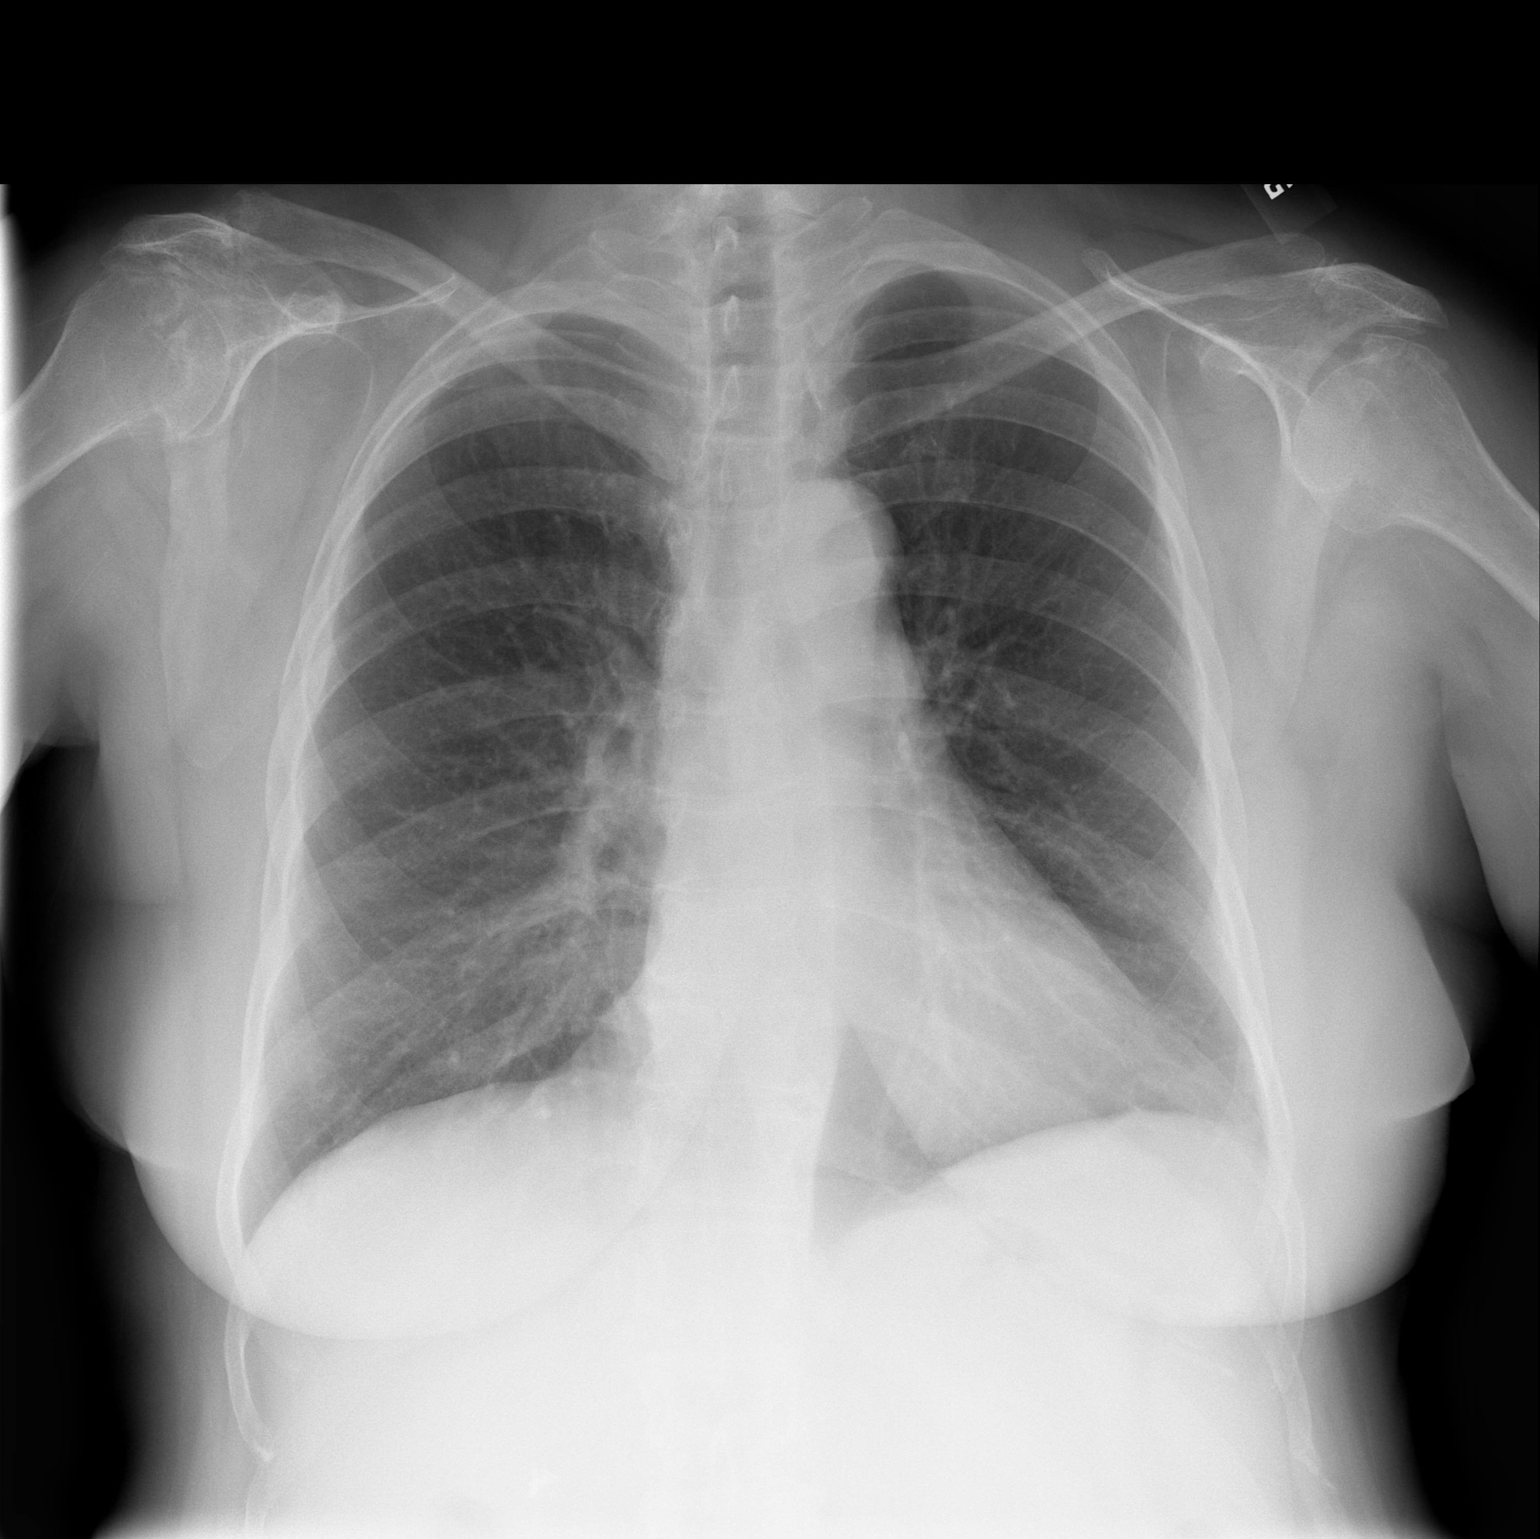

[w chest lat]
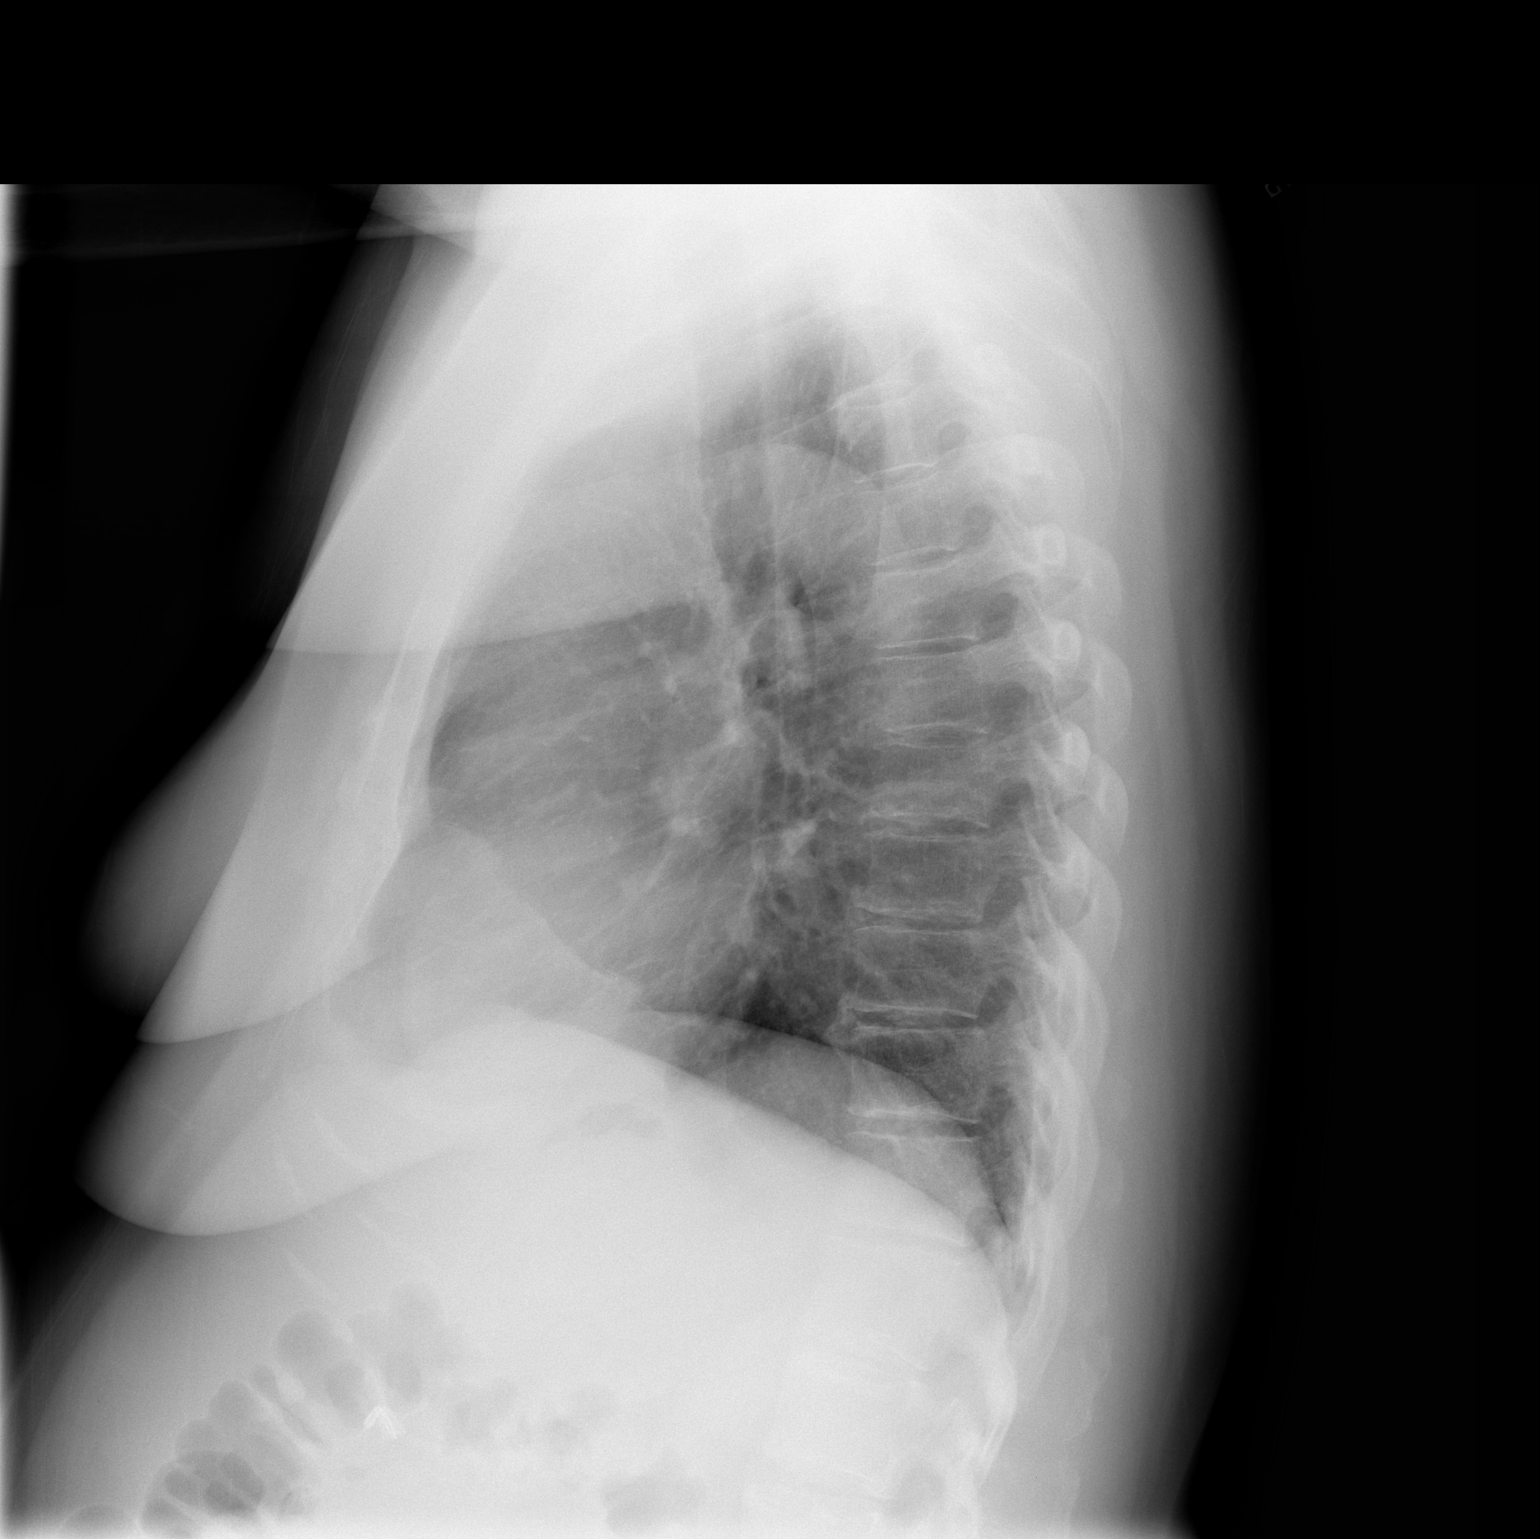

[2 of 2 positions shown; findings below may reference images not displayed]

IMPRESSION: No active lung disease.

## 2007-03-06 ENCOUNTER — Encounter (INDEPENDENT_AMBULATORY_CARE_PROVIDER_SITE_OTHER): Payer: Self-pay | Admitting: Family Medicine

## 2007-04-17 ENCOUNTER — Encounter (INDEPENDENT_AMBULATORY_CARE_PROVIDER_SITE_OTHER): Payer: Self-pay | Admitting: *Deleted

## 2007-04-17 ENCOUNTER — Ambulatory Visit: Payer: Self-pay | Admitting: Family Medicine

## 2007-07-05 ENCOUNTER — Ambulatory Visit: Payer: Self-pay | Admitting: Family Medicine

## 2007-07-05 ENCOUNTER — Ambulatory Visit (HOSPITAL_COMMUNITY): Admission: RE | Admit: 2007-07-05 | Discharge: 2007-07-05 | Payer: Self-pay | Admitting: Family Medicine

## 2007-07-05 LAB — CONVERTED CEMR LAB: LDL Goal: 70 mg/dL

## 2007-07-06 IMAGING — CR DG LUMBAR SPINE COMPLETE 4+V
5 series · 5 of 5 positions shown · non-contrast
Comparison: none

CLINICAL DATA: Low back and bilateral hip pain.  
 LUMBAR SPINE:
 Four views of the lumbar spine show degenerative disk disease at L4-5 and L5-S1.  No compression deformity is seen, and alignment is normal.  Degenerative changes also noted involving the face joints, particularly at L5-S1.  The SI joints appear normal.

[view not recorded (1 of 5)]
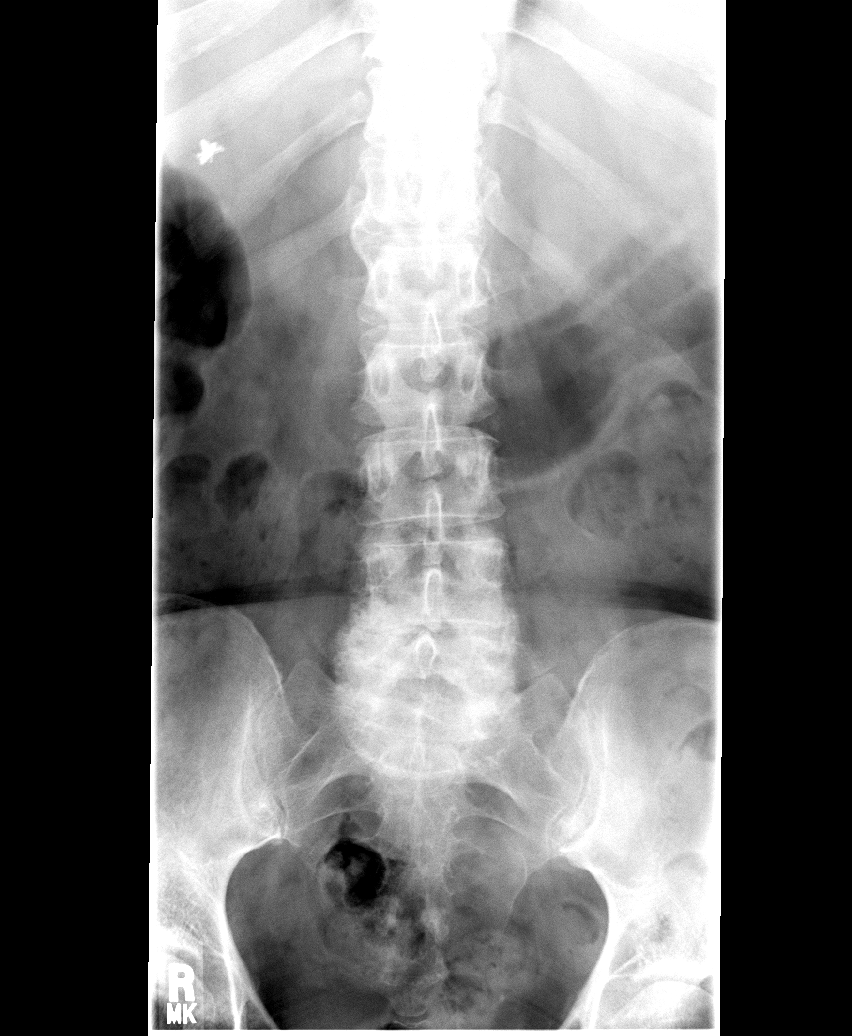

[view not recorded (2 of 5)]
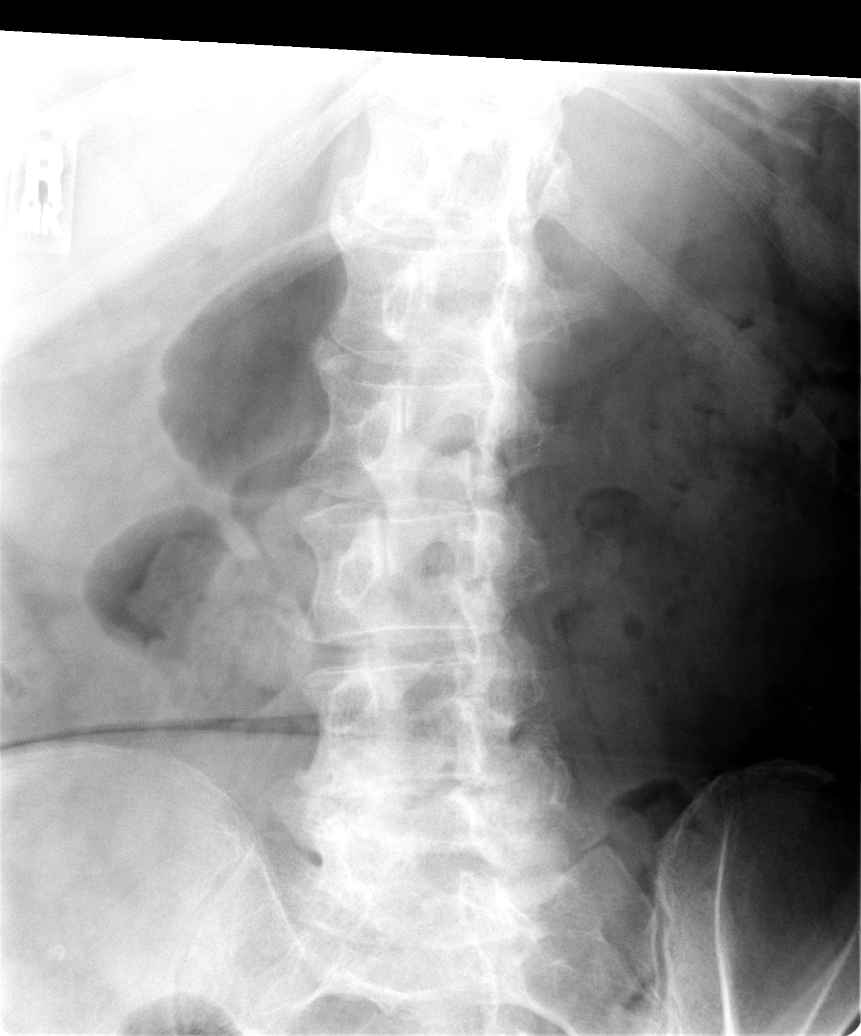

[view not recorded (3 of 5)]
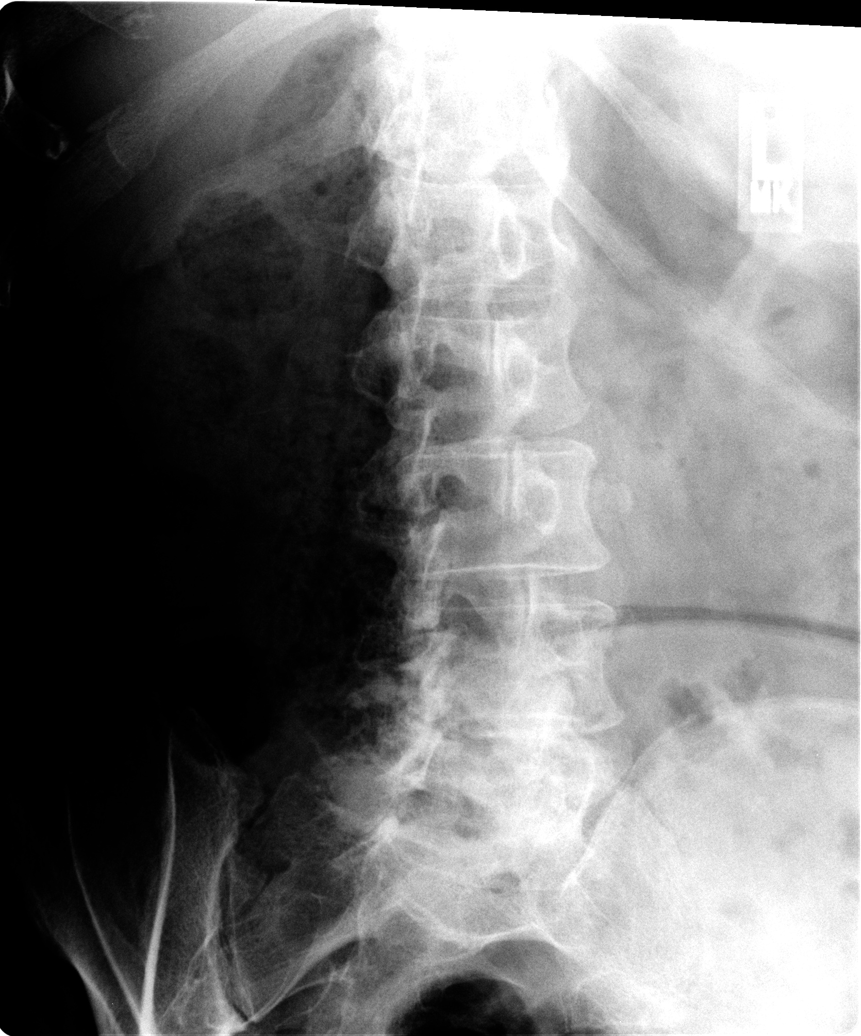

[view not recorded (4 of 5)]
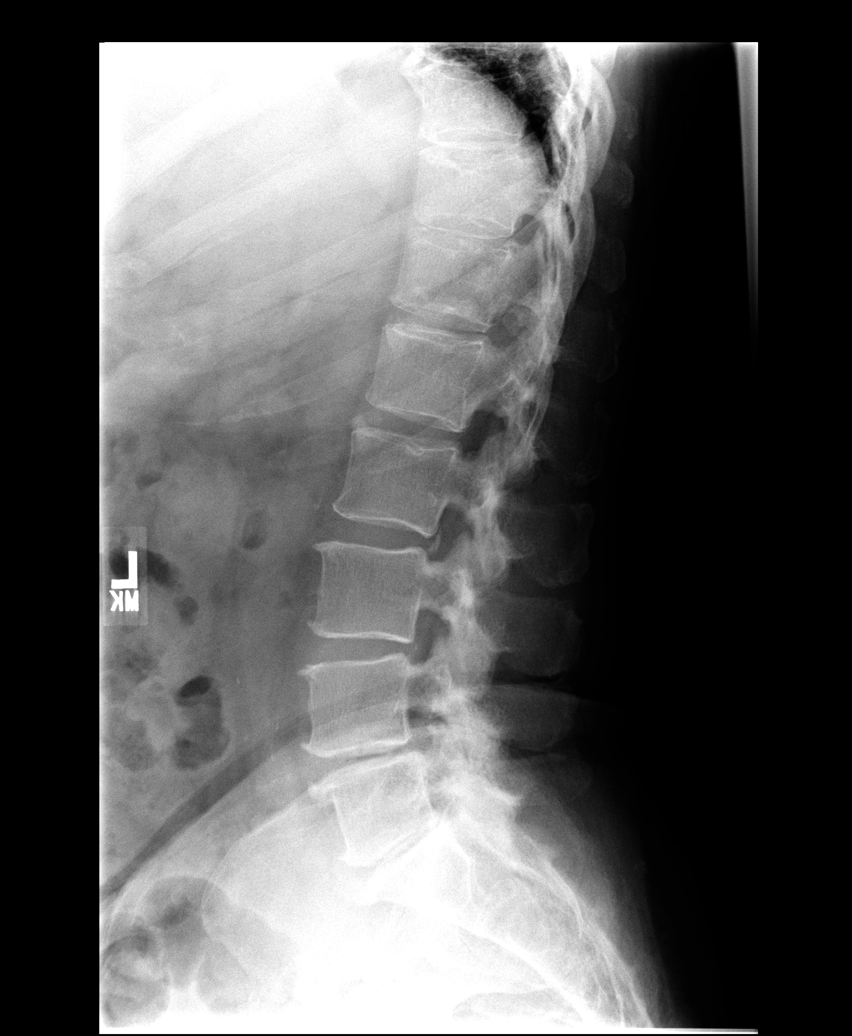

[view not recorded (5 of 5)]
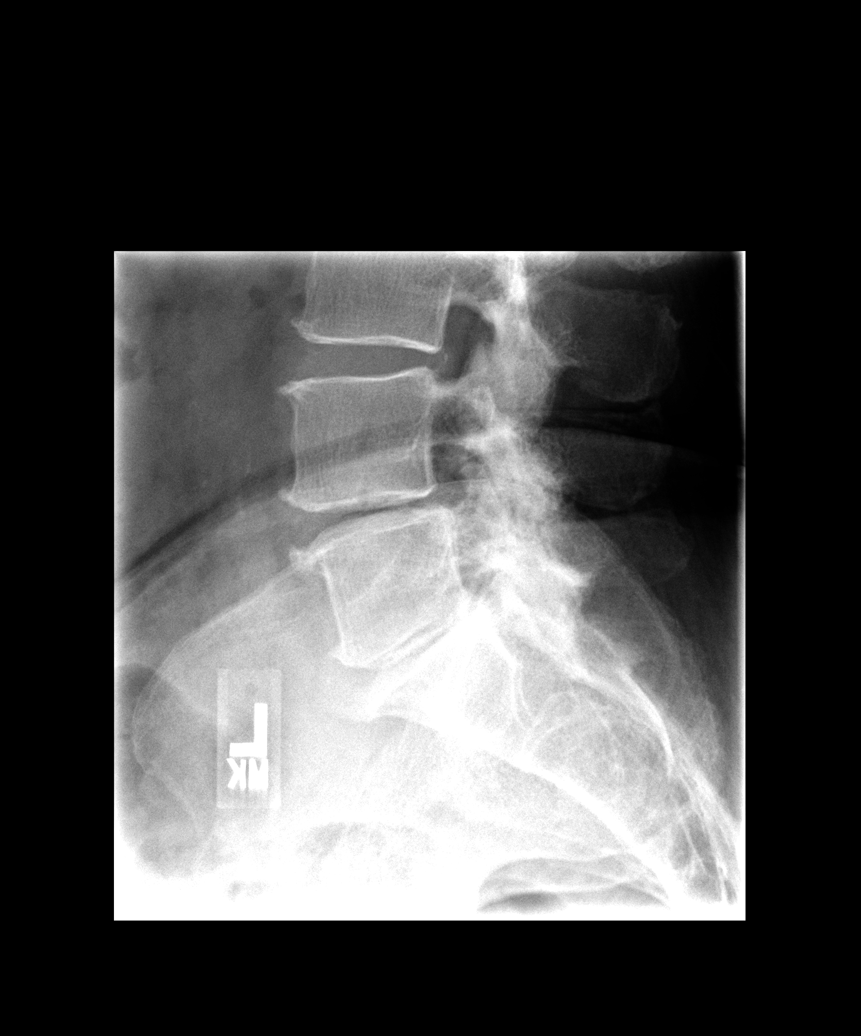

[5 of 5 positions shown; findings below may reference images not displayed]

IMPRESSION: Degenerative disk disease at L4-5 and L5-S1.  Normal alignment.

## 2007-07-06 IMAGING — CR DG HIP COMPLETE 2+V*R*
2 series · 2 of 2 positions shown · non-contrast
Comparison: none

DUPLICATE COPY for exam association in [facility] - no change from original report, 08/06/05.
CLINICAL DATA: Low back and right [REDACTED] pain.
 RIGHT HIP:
 Two views of the right hip show no acute abnormality. The right hip joint space appears normal.  The ramus is intact.

[view not recorded (1 of 2)]
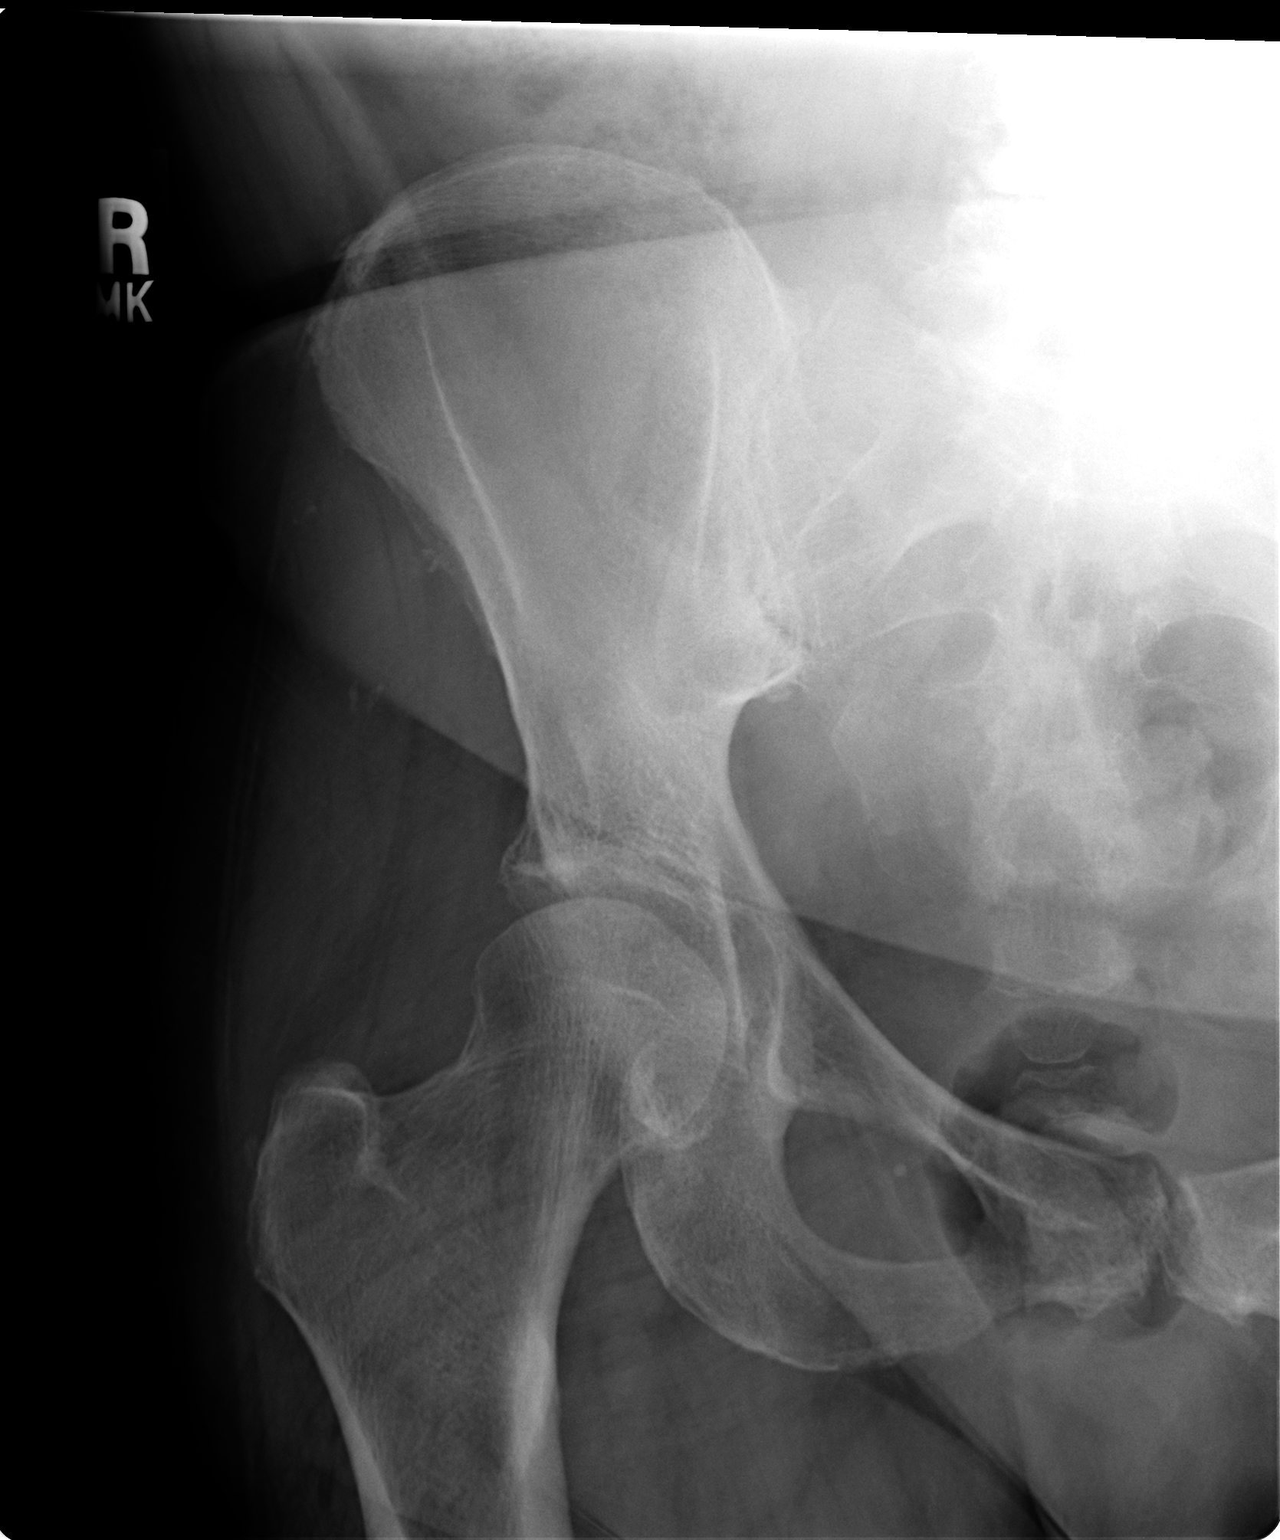

[view not recorded (2 of 2)]
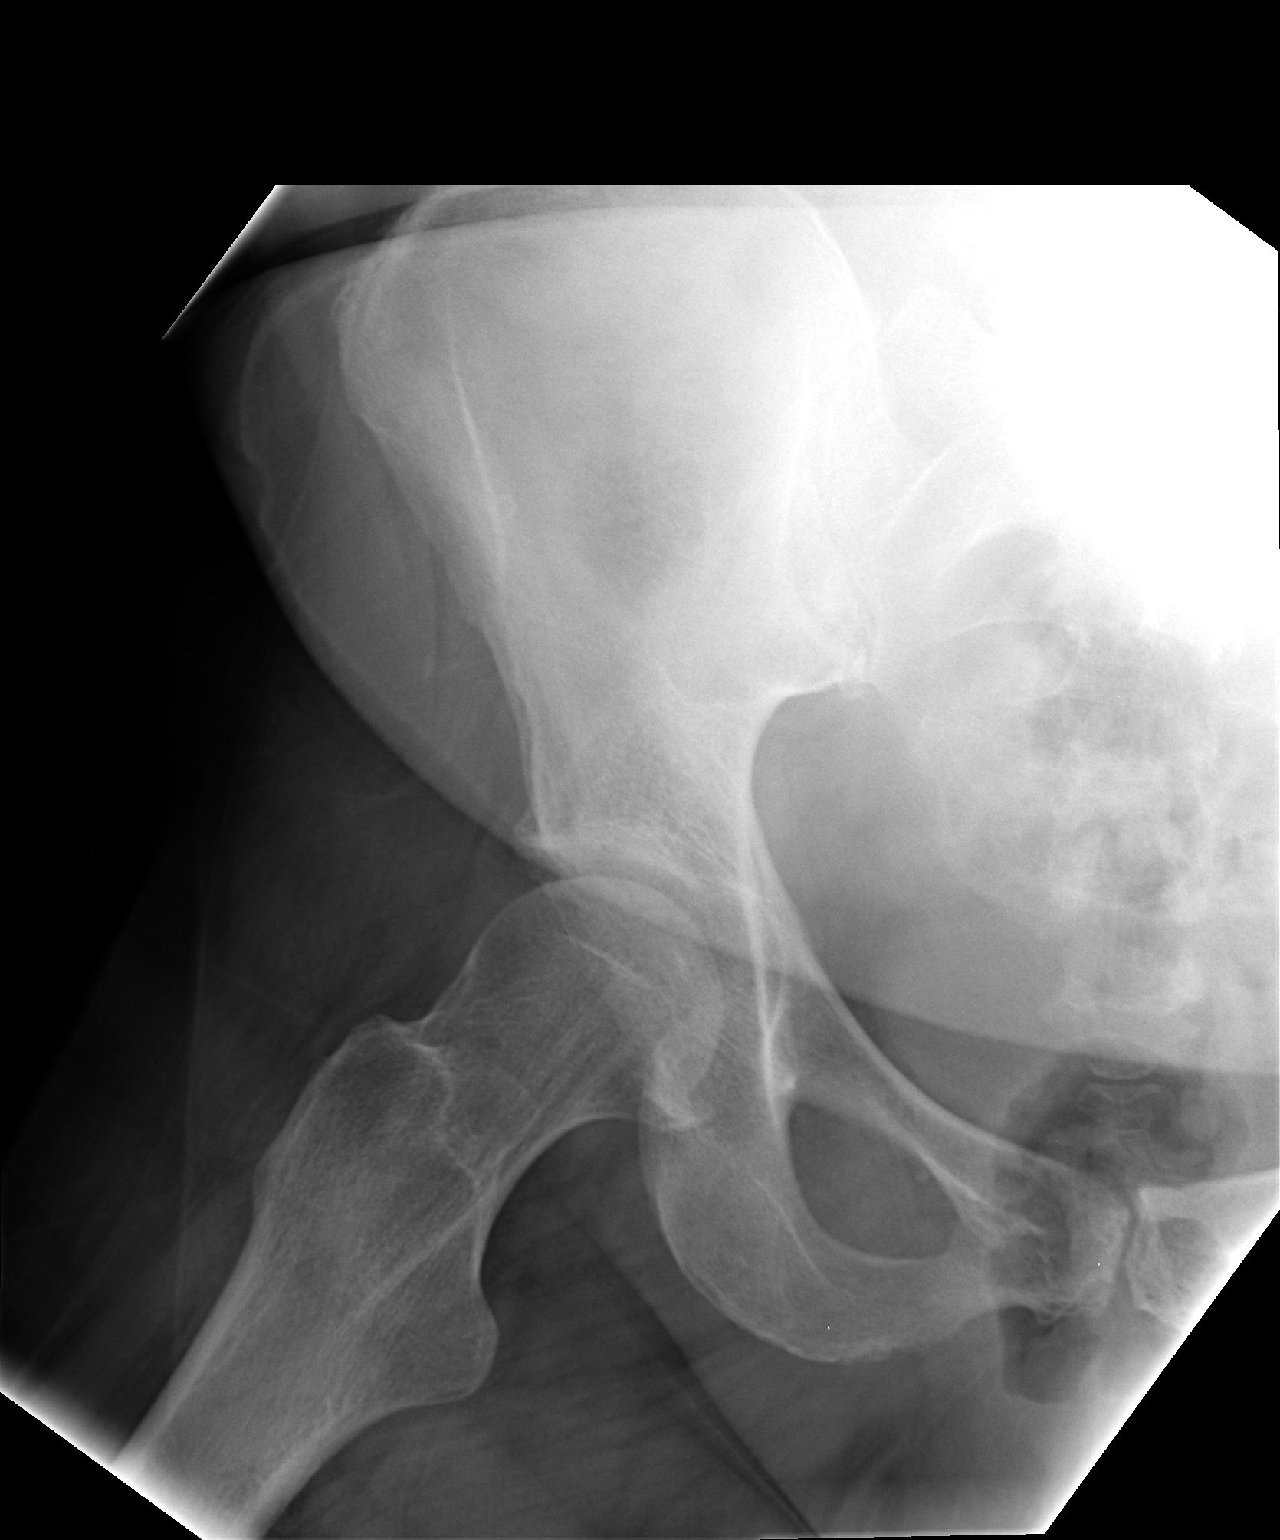

[2 of 2 positions shown; findings below may reference images not displayed]

IMPRESSION: Negative right hip.
 LEFT HIP:
 Two views of the left hip show no acute abnormality.  Only minimal degenerative change is noted.  The ramus is intact, and the SI joints appear normal.
IMPRESSION: Negative left hip.  Minimal degenerative change.

## 2007-07-06 IMAGING — CR DG HIP (WITH OR WITHOUT PELVIS) 2-3V*L*
2 series · 2 of 2 positions shown · non-contrast
Comparison: none

DUPLICATE COPY for exam association in [facility] - no change from original report, 08/06/05.
CLINICAL DATA: Low back and right [REDACTED] pain.
 RIGHT HIP:
 Two views of the right hip show no acute abnormality. The right hip joint space appears normal.  The ramus is intact.

[view not recorded (1 of 2)]
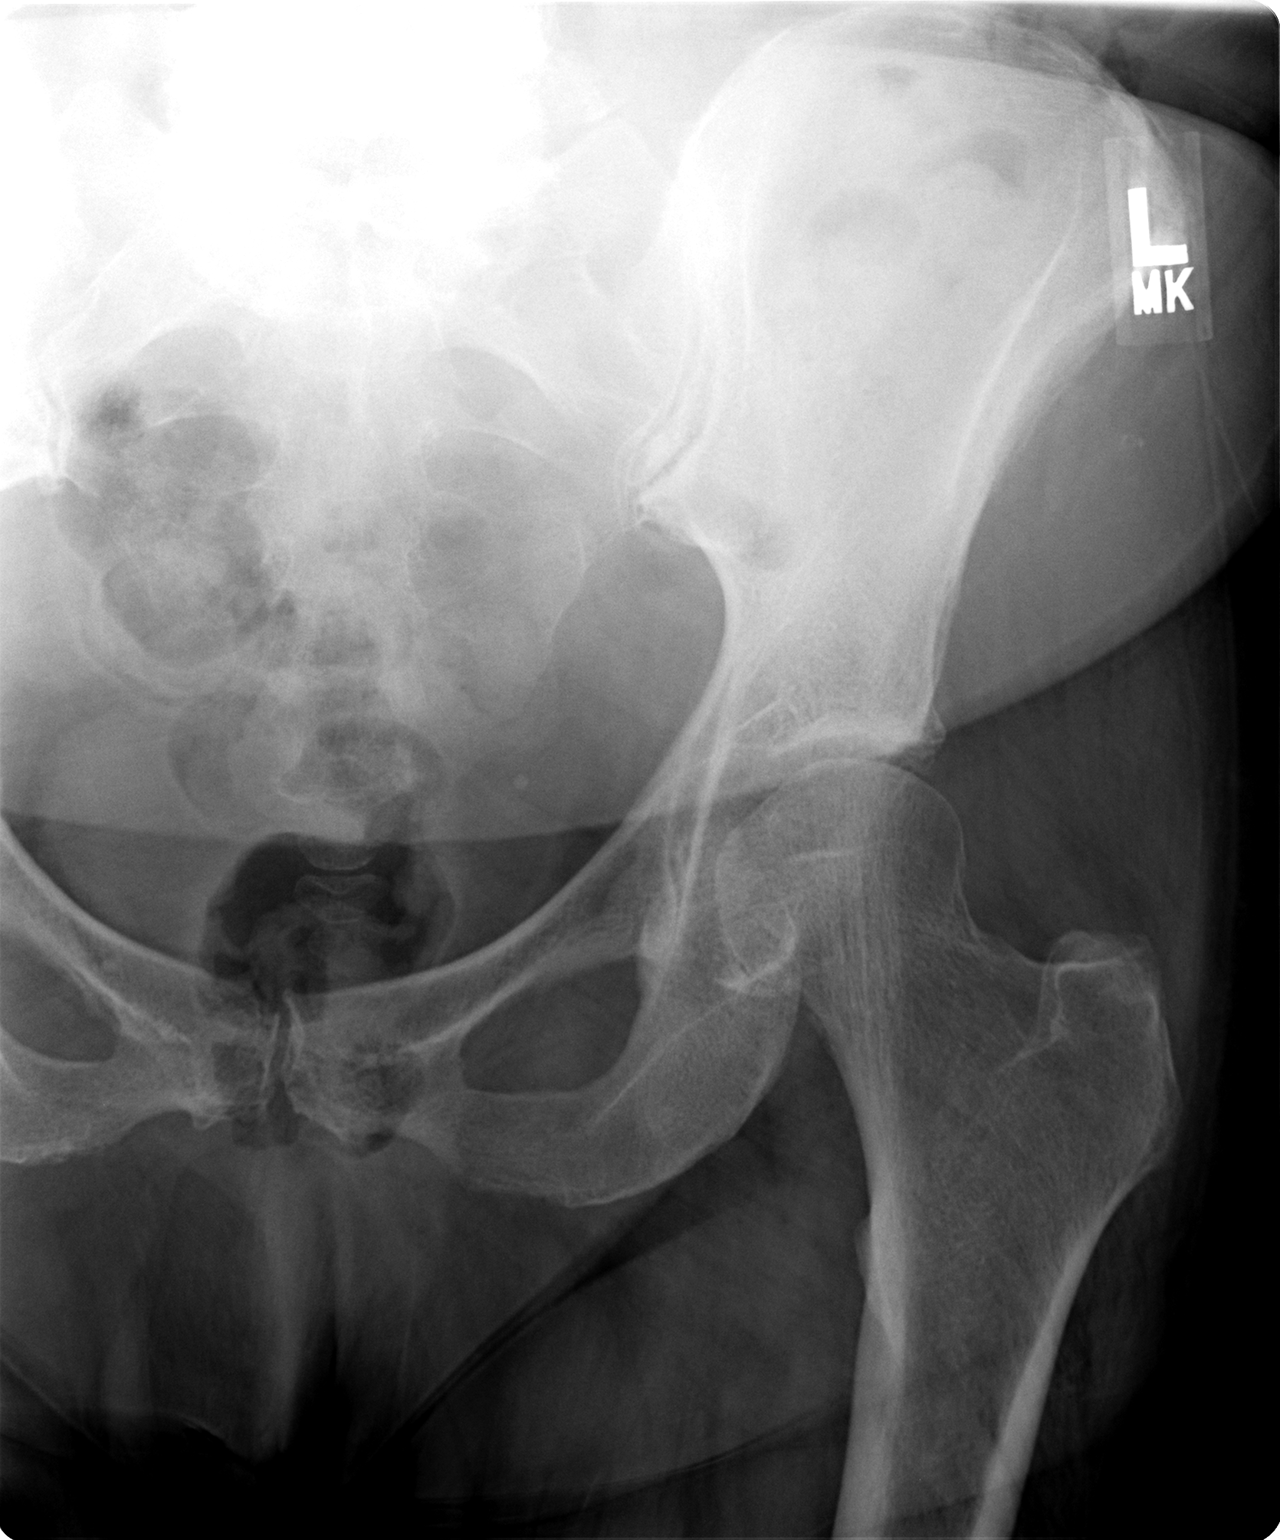

[view not recorded (2 of 2)]
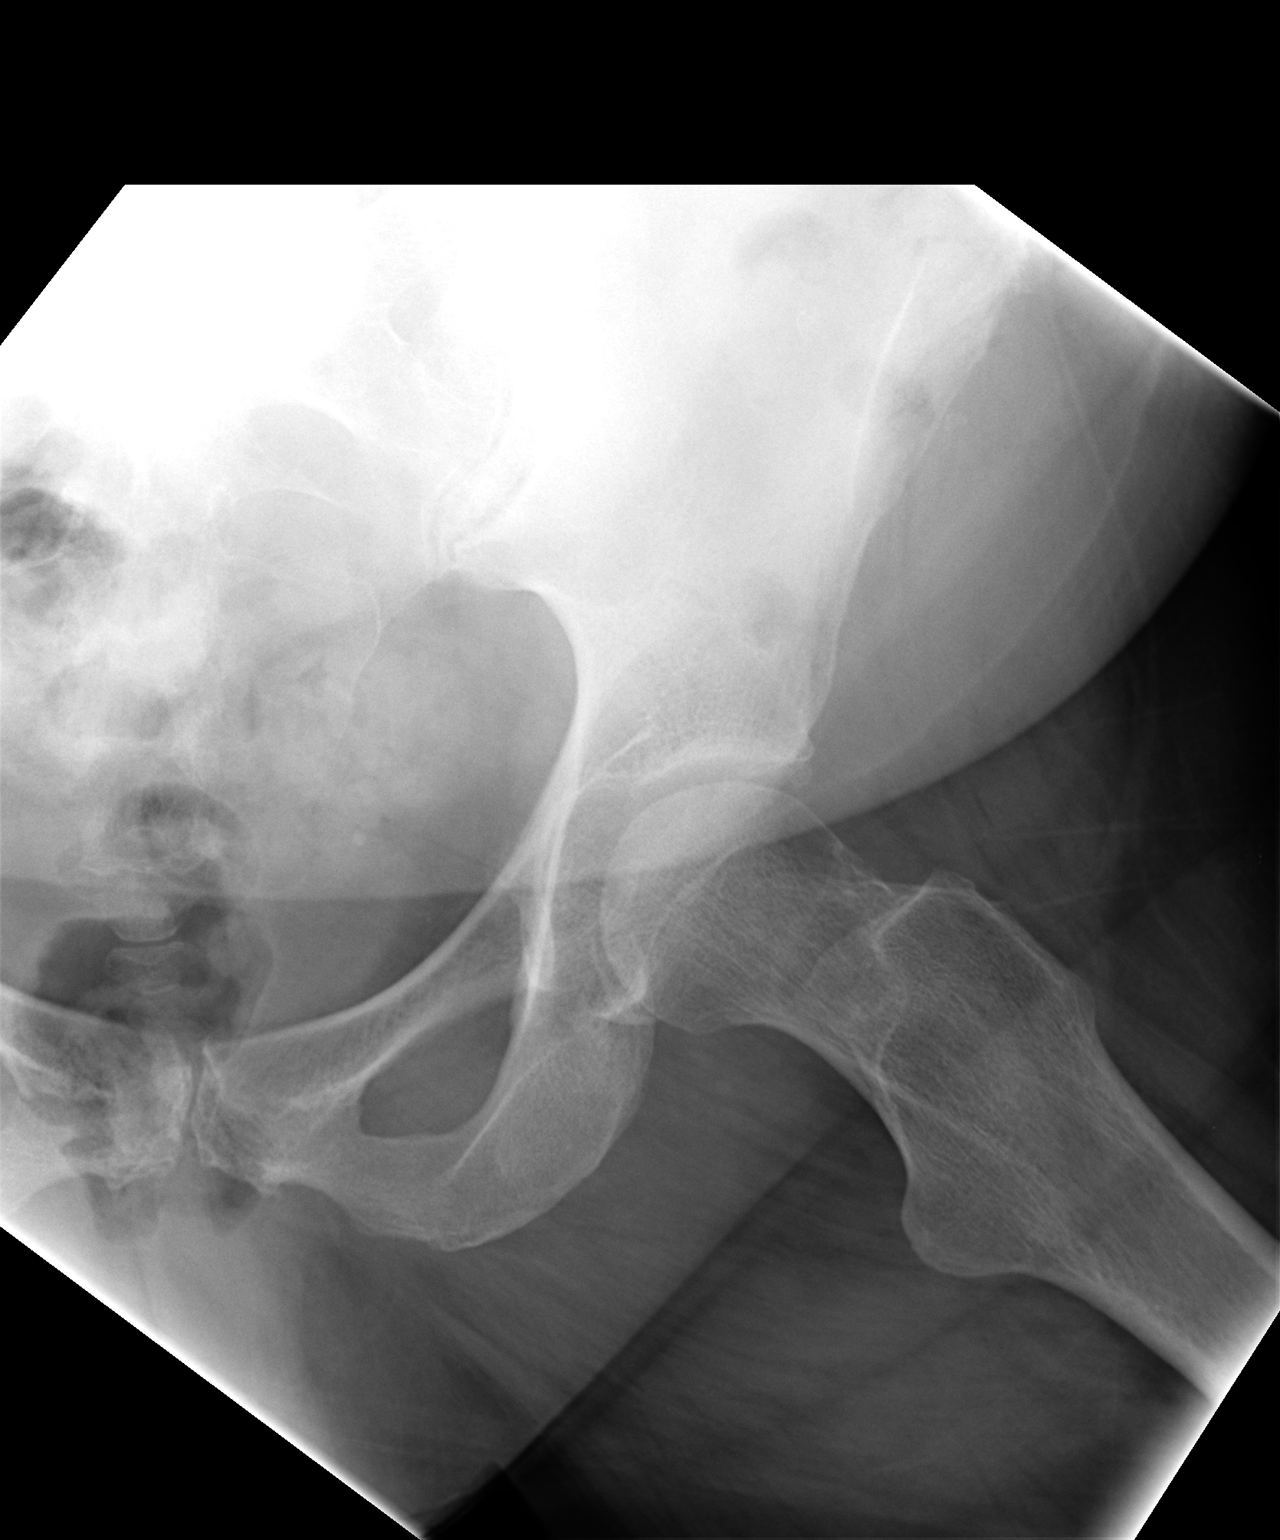

[2 of 2 positions shown; findings below may reference images not displayed]

IMPRESSION: Negative right hip.
 LEFT HIP:
 Two views of the left hip show no acute abnormality.  Only minimal degenerative change is noted.  The ramus is intact, and the SI joints appear normal.
IMPRESSION: Negative left hip.  Minimal degenerative change.

## 2007-07-12 ENCOUNTER — Encounter (INDEPENDENT_AMBULATORY_CARE_PROVIDER_SITE_OTHER): Payer: Self-pay | Admitting: Family Medicine

## 2007-07-12 ENCOUNTER — Ambulatory Visit: Payer: Self-pay | Admitting: Family Medicine

## 2007-07-12 LAB — CONVERTED CEMR LAB
ALT: 15 units/L (ref 0–35)
Albumin: 4.7 g/dL (ref 3.5–5.2)
BUN: 24 mg/dL — ABNORMAL HIGH (ref 6–23)
CO2: 21 meq/L (ref 19–32)
Calcium: 9.8 mg/dL (ref 8.4–10.5)
Cholesterol: 132 mg/dL (ref 0–200)
Glucose, Bld: 149 mg/dL — ABNORMAL HIGH (ref 70–99)
Microalbumin U total vol: >=2 mg/L
Potassium: 4.7 meq/L (ref 3.5–5.3)
Protein, U semiquant: 30
Sodium: 142 meq/L (ref 135–145)
TSH: 3.117 microintl units/mL (ref 0.350–5.50)
Total Bilirubin: 0.3 mg/dL (ref 0.3–1.2)

## 2007-08-08 ENCOUNTER — Ambulatory Visit: Payer: Self-pay | Admitting: Family Medicine

## 2007-08-15 ENCOUNTER — Encounter: Payer: Self-pay | Admitting: Family Medicine

## 2007-08-15 ENCOUNTER — Telehealth: Payer: Self-pay | Admitting: Family Medicine

## 2007-08-15 ENCOUNTER — Ambulatory Visit (HOSPITAL_COMMUNITY): Admission: RE | Admit: 2007-08-15 | Discharge: 2007-08-15 | Payer: Self-pay | Admitting: Family Medicine

## 2007-08-15 ENCOUNTER — Ambulatory Visit: Payer: Self-pay | Admitting: Vascular Surgery

## 2007-08-15 ENCOUNTER — Ambulatory Visit: Payer: Self-pay | Admitting: Family Medicine

## 2007-08-22 ENCOUNTER — Encounter (INDEPENDENT_AMBULATORY_CARE_PROVIDER_SITE_OTHER): Payer: Self-pay | Admitting: Family Medicine

## 2007-09-05 ENCOUNTER — Encounter (INDEPENDENT_AMBULATORY_CARE_PROVIDER_SITE_OTHER): Payer: Self-pay | Admitting: Family Medicine

## 2007-09-05 ENCOUNTER — Ambulatory Visit: Payer: Self-pay | Admitting: Family Medicine

## 2007-09-05 LAB — CONVERTED CEMR LAB
AST: 14 units/L (ref 0–37)
Alkaline Phosphatase: 65 units/L (ref 39–117)
Anti Nuclear Antibody(ANA): NEGATIVE
BUN: 32 mg/dL — ABNORMAL HIGH (ref 6–23)
Calcium: 9.6 mg/dL (ref 8.4–10.5)
Chloride: 98 meq/L (ref 96–112)
Creatinine, Ser: 1.53 mg/dL — ABNORMAL HIGH (ref 0.40–1.20)
MCHC: 33.7 g/dL (ref 30.0–36.0)
Monocytes Relative: 9 % (ref 3–12)
Neutro Abs: 6.1 10*3/uL (ref 1.7–7.7)
Platelets: 234 10*3/uL (ref 150–400)
RBC: 3.56 M/uL — ABNORMAL LOW (ref 3.87–5.11)
Total Bilirubin: 0.3 mg/dL (ref 0.3–1.2)

## 2007-09-06 ENCOUNTER — Telehealth: Payer: Self-pay | Admitting: *Deleted

## 2007-09-08 ENCOUNTER — Telehealth (INDEPENDENT_AMBULATORY_CARE_PROVIDER_SITE_OTHER): Payer: Self-pay | Admitting: Family Medicine

## 2007-09-11 ENCOUNTER — Ambulatory Visit: Payer: Self-pay | Admitting: Family Medicine

## 2007-09-11 ENCOUNTER — Telehealth: Payer: Self-pay | Admitting: *Deleted

## 2007-09-11 ENCOUNTER — Encounter: Admission: RE | Admit: 2007-09-11 | Discharge: 2007-09-11 | Payer: Self-pay | Admitting: Family Medicine

## 2007-09-11 ENCOUNTER — Encounter (INDEPENDENT_AMBULATORY_CARE_PROVIDER_SITE_OTHER): Payer: Self-pay | Admitting: Family Medicine

## 2007-09-14 ENCOUNTER — Encounter: Admission: RE | Admit: 2007-09-14 | Discharge: 2007-10-12 | Payer: Self-pay | Admitting: Family Medicine

## 2007-09-21 ENCOUNTER — Telehealth: Payer: Self-pay | Admitting: *Deleted

## 2007-10-12 ENCOUNTER — Encounter (INDEPENDENT_AMBULATORY_CARE_PROVIDER_SITE_OTHER): Payer: Self-pay | Admitting: Family Medicine

## 2007-11-02 ENCOUNTER — Encounter (INDEPENDENT_AMBULATORY_CARE_PROVIDER_SITE_OTHER): Payer: Self-pay | Admitting: *Deleted

## 2007-12-05 ENCOUNTER — Telehealth (INDEPENDENT_AMBULATORY_CARE_PROVIDER_SITE_OTHER): Payer: Self-pay | Admitting: Family Medicine

## 2007-12-11 ENCOUNTER — Ambulatory Visit: Payer: Self-pay | Admitting: Family Medicine

## 2008-01-05 ENCOUNTER — Telehealth (INDEPENDENT_AMBULATORY_CARE_PROVIDER_SITE_OTHER): Payer: Self-pay | Admitting: Family Medicine

## 2008-01-10 ENCOUNTER — Encounter (INDEPENDENT_AMBULATORY_CARE_PROVIDER_SITE_OTHER): Payer: Self-pay | Admitting: Family Medicine

## 2008-01-10 ENCOUNTER — Ambulatory Visit: Payer: Self-pay | Admitting: Family Medicine

## 2008-01-10 DIAGNOSIS — F259 Schizoaffective disorder, unspecified: Secondary | ICD-10-CM

## 2008-01-10 LAB — CONVERTED CEMR LAB
Albumin: 4.6 g/dL (ref 3.5–5.2)
Calcium: 9.7 mg/dL (ref 8.4–10.5)
Glucose, Bld: 118 mg/dL — ABNORMAL HIGH (ref 70–99)
Hgb A1c MFr Bld: 6.4 %
Potassium: 4.8 meq/L (ref 3.5–5.3)
Sodium: 136 meq/L (ref 135–145)
TSH: 2.445 microintl units/mL (ref 0.350–4.50)

## 2008-02-07 ENCOUNTER — Encounter: Admission: RE | Admit: 2008-02-07 | Discharge: 2008-02-07 | Payer: Self-pay | Admitting: Family Medicine

## 2008-02-09 ENCOUNTER — Encounter (INDEPENDENT_AMBULATORY_CARE_PROVIDER_SITE_OTHER): Payer: Self-pay | Admitting: Family Medicine

## 2008-02-28 ENCOUNTER — Encounter (INDEPENDENT_AMBULATORY_CARE_PROVIDER_SITE_OTHER): Payer: Self-pay | Admitting: Family Medicine

## 2008-03-06 ENCOUNTER — Telehealth: Payer: Self-pay | Admitting: *Deleted

## 2008-03-06 ENCOUNTER — Ambulatory Visit: Payer: Self-pay | Admitting: Family Medicine

## 2008-03-06 ENCOUNTER — Ambulatory Visit (HOSPITAL_COMMUNITY): Admission: RE | Admit: 2008-03-06 | Discharge: 2008-03-06 | Payer: Self-pay | Admitting: Family Medicine

## 2008-03-07 ENCOUNTER — Encounter (INDEPENDENT_AMBULATORY_CARE_PROVIDER_SITE_OTHER): Payer: Self-pay | Admitting: Family Medicine

## 2008-03-08 ENCOUNTER — Encounter (INDEPENDENT_AMBULATORY_CARE_PROVIDER_SITE_OTHER): Payer: Self-pay | Admitting: Family Medicine

## 2008-03-13 ENCOUNTER — Ambulatory Visit: Payer: Self-pay | Admitting: Family Medicine

## 2008-03-13 ENCOUNTER — Encounter (INDEPENDENT_AMBULATORY_CARE_PROVIDER_SITE_OTHER): Payer: Self-pay | Admitting: Family Medicine

## 2008-03-13 LAB — CONVERTED CEMR LAB
Bilirubin Urine: NEGATIVE
Blood in Urine, dipstick: NEGATIVE
Glucose, Urine, Semiquant: NEGATIVE
Nitrite: NEGATIVE
Urobilinogen, UA: 0.2
WBC Urine, dipstick: NEGATIVE
pH: 6

## 2008-03-18 ENCOUNTER — Encounter (INDEPENDENT_AMBULATORY_CARE_PROVIDER_SITE_OTHER): Payer: Self-pay | Admitting: Family Medicine

## 2008-03-18 LAB — CONVERTED CEMR LAB
ALT: 20 units/L (ref 0–35)
AST: 16 units/L (ref 0–37)
Alkaline Phosphatase: 58 units/L (ref 39–117)
CO2: 21 meq/L (ref 19–32)
Calcium: 9.6 mg/dL (ref 8.4–10.5)
Chloride: 98 meq/L (ref 96–112)
Creatinine, Ser: 1.98 mg/dL — ABNORMAL HIGH (ref 0.40–1.20)
Potassium: 4.7 meq/L (ref 3.5–5.3)
Sodium: 135 meq/L (ref 135–145)

## 2008-03-21 ENCOUNTER — Ambulatory Visit: Payer: Self-pay | Admitting: Family Medicine

## 2008-03-21 ENCOUNTER — Encounter (INDEPENDENT_AMBULATORY_CARE_PROVIDER_SITE_OTHER): Payer: Self-pay | Admitting: Family Medicine

## 2008-03-21 DIAGNOSIS — M898X9 Other specified disorders of bone, unspecified site: Secondary | ICD-10-CM | POA: Insufficient documentation

## 2008-03-21 DIAGNOSIS — R269 Unspecified abnormalities of gait and mobility: Secondary | ICD-10-CM | POA: Insufficient documentation

## 2008-03-21 LAB — CONVERTED CEMR LAB
BUN: 40 mg/dL — ABNORMAL HIGH (ref 6–23)
Calcium: 10 mg/dL (ref 8.4–10.5)
PTH: 51.2 pg/mL (ref 14.0–72.0)
Phosphorus: 3.8 mg/dL (ref 2.3–4.6)
Potassium: 4.9 meq/L (ref 3.5–5.3)
Sodium: 137 meq/L (ref 135–145)

## 2008-03-26 ENCOUNTER — Encounter (INDEPENDENT_AMBULATORY_CARE_PROVIDER_SITE_OTHER): Payer: Self-pay | Admitting: Family Medicine

## 2008-03-26 LAB — CONVERTED CEMR LAB: MCV: 85 fL

## 2008-04-09 ENCOUNTER — Telehealth: Payer: Self-pay | Admitting: *Deleted

## 2008-04-23 ENCOUNTER — Encounter (INDEPENDENT_AMBULATORY_CARE_PROVIDER_SITE_OTHER): Payer: Self-pay | Admitting: Family Medicine

## 2008-04-23 LAB — CONVERTED CEMR LAB
HCT: 29.3 %
MCV: 85 fL
platelet count: 202 10*3/uL

## 2008-05-02 ENCOUNTER — Encounter (INDEPENDENT_AMBULATORY_CARE_PROVIDER_SITE_OTHER): Payer: Self-pay | Admitting: Family Medicine

## 2008-06-19 IMAGING — CT CT HEAD W/O CM
1 series · 16 of 30 positions shown, 20 images · IV contrast (agent unspecified)
Comparison: None.

CLINICAL DATA: Fall. 
 HEAD CT WITHOUT CONTRAST:
TECHNIQUE: Contiguous axial images were obtained from the base of the skull through the vertex according to standard protocol without contrast.

[Series 2: trauma head · axial · 0.47mm/px · z∈[+150,+301]mm · 16 of 32 slices shown, 20 images]
[im 2/32  brain]
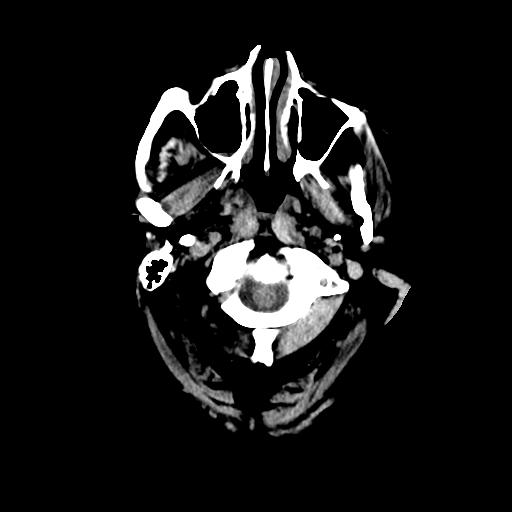
[im 2/32  bone]
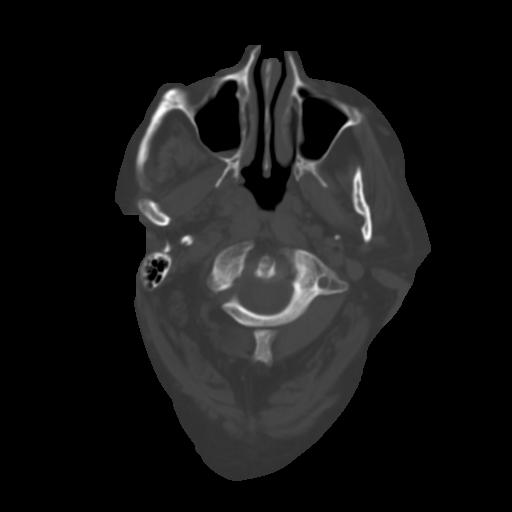
[im 4/32  brain]
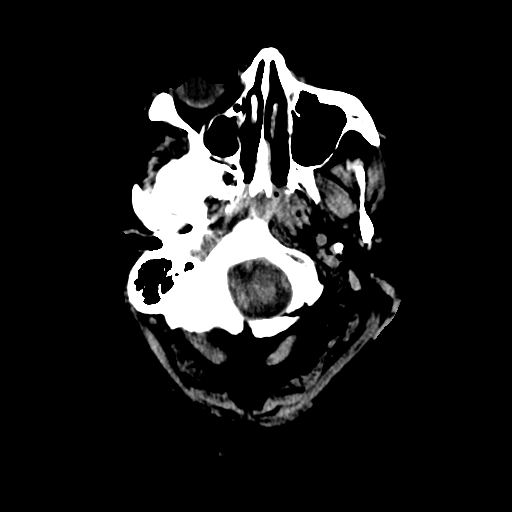
[im 6/32  brain]
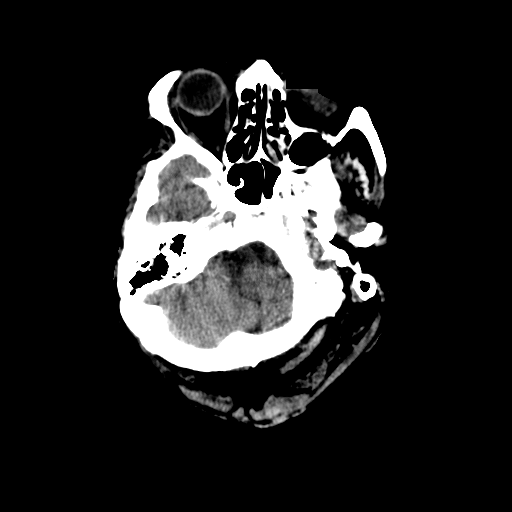
[im 8/32  brain]
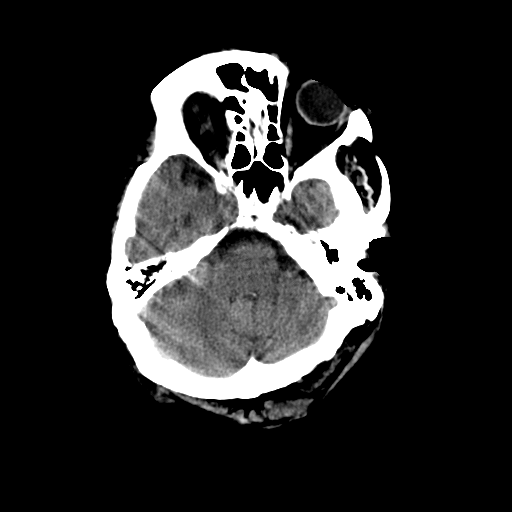
[im 9/32  brain]
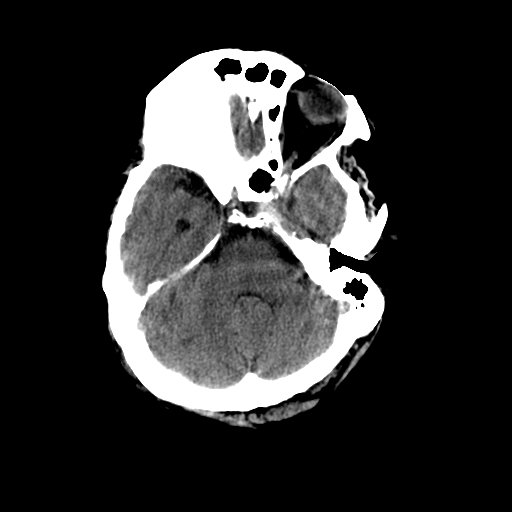
[im 9/32  bone]
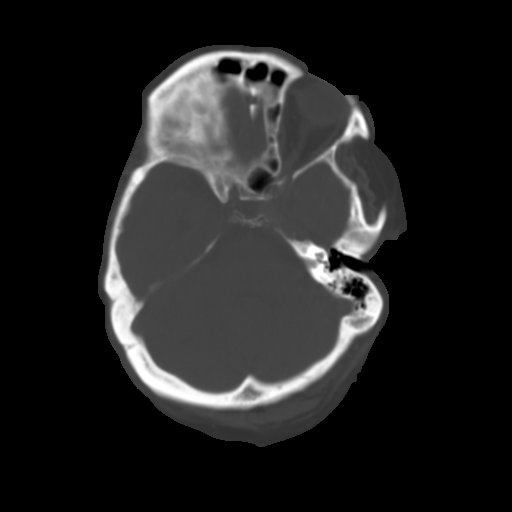
[im 11/32  brain]
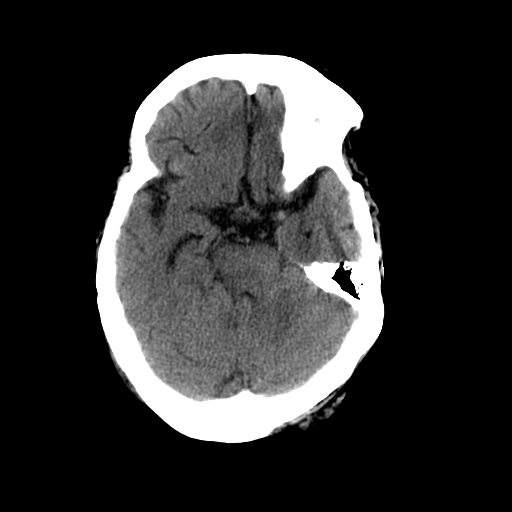
[im 13/32  brain]
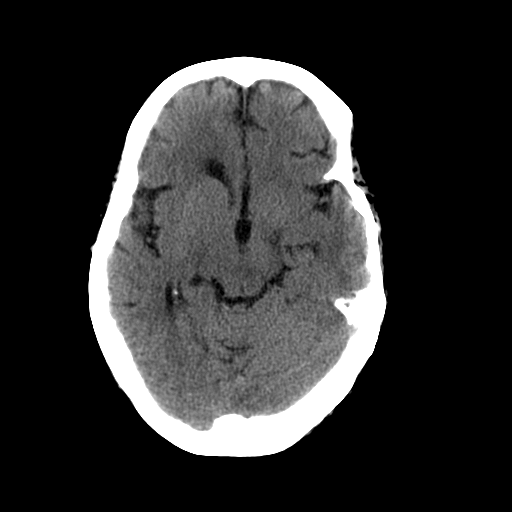
[im 15/32  brain]
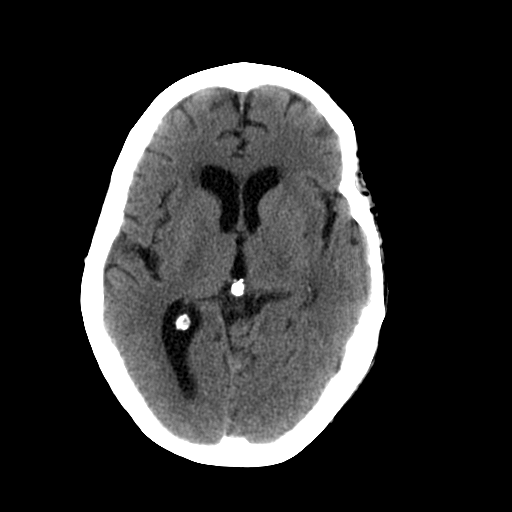
[im 17/32  brain]
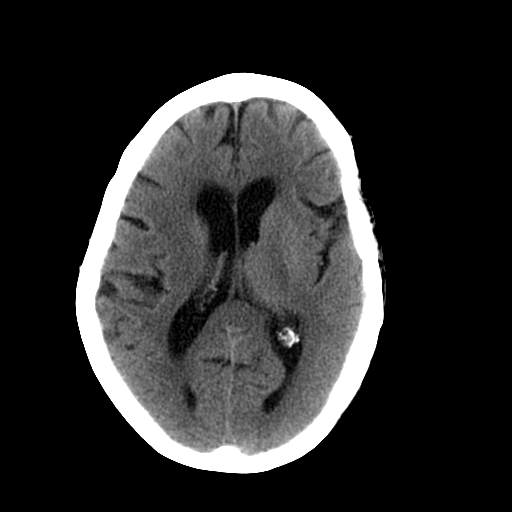
[im 17/32  bone]
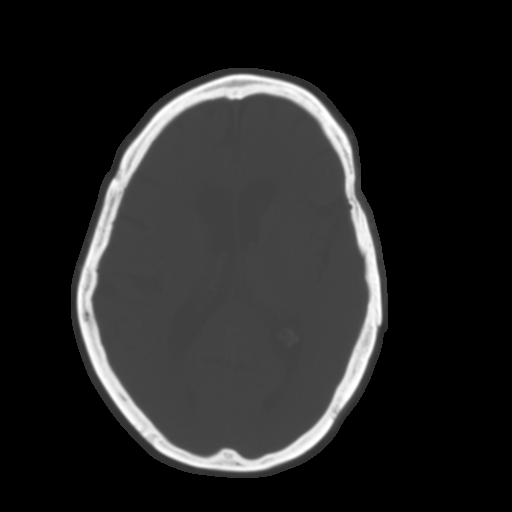
[im 19/32  brain]
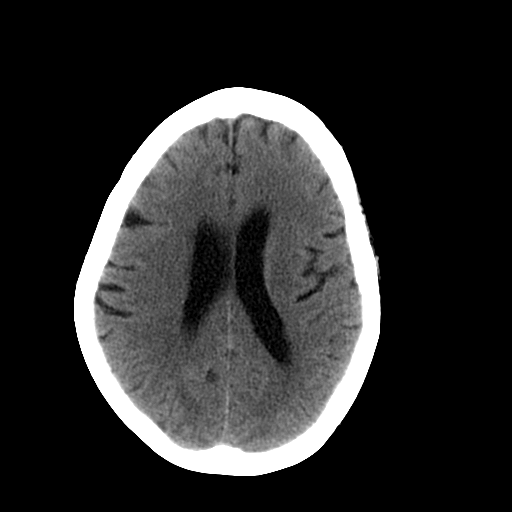
[im 21/32  brain]
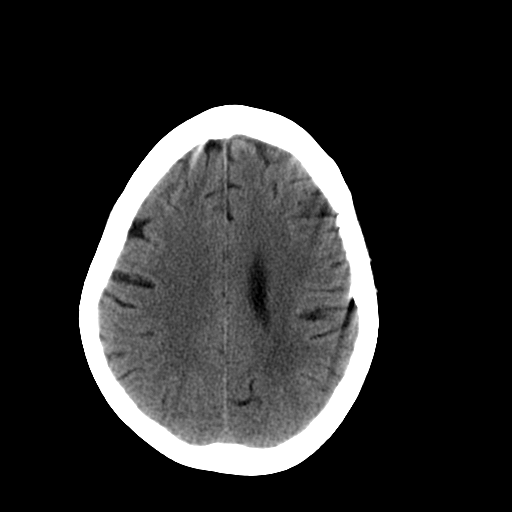
[im 23/32  brain]
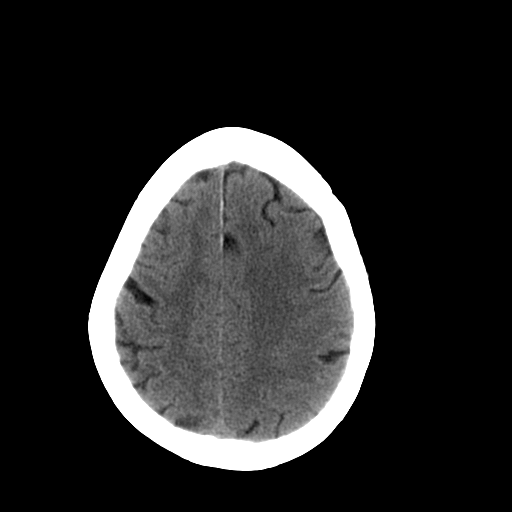
[im 24/32  brain]
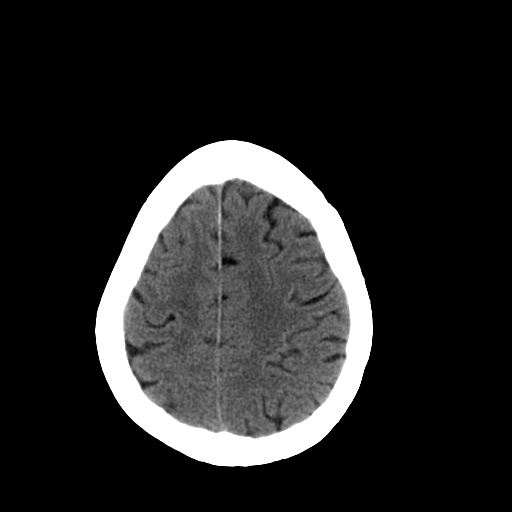
[im 24/32  bone]
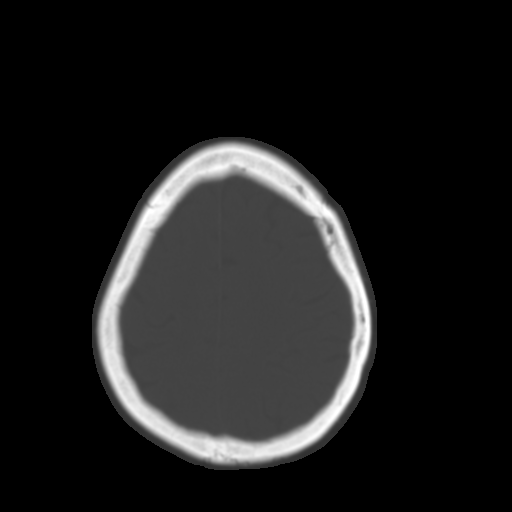
[im 26/32  brain]
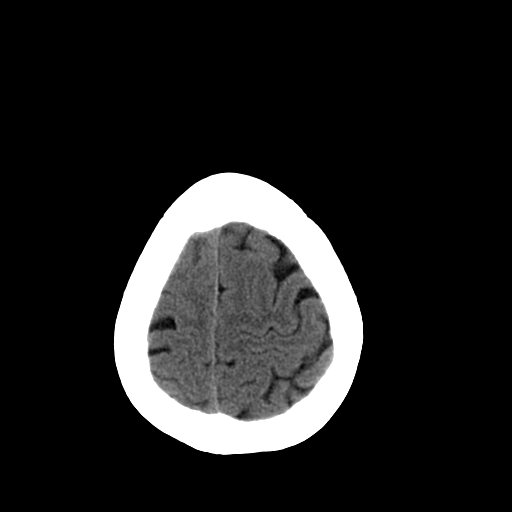
[im 28/32  brain]
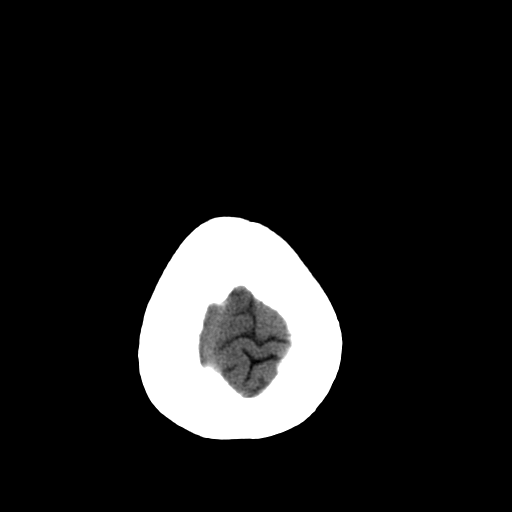
[im 30/32  brain]
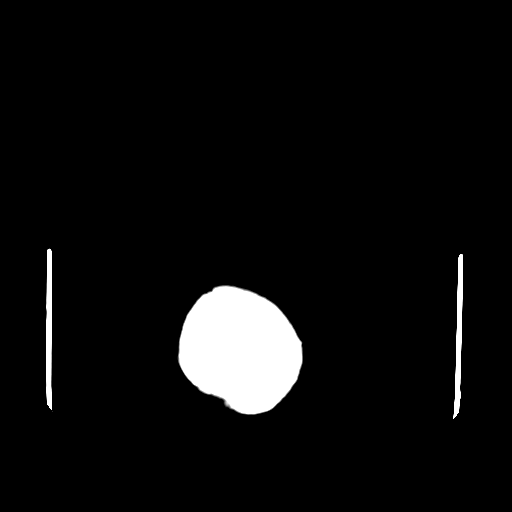

[16 of 30 positions shown; findings below may reference images not displayed]

FINDINGS: There is generalized atrophy.   Small chronic lacunar infarctions are noted in the basal ganglia bilaterally. There is no hemorrhage, mass or acute infarct.  Mild chronic ischemic change in the frontal white matter bilaterally.
IMPRESSION: Chronic and small vessel ischemic change.  No acute abnormality.

## 2008-06-19 IMAGING — CR DG CHEST 2V
1 series · 1 of 1 positions shown · non-contrast
Comparison: None.

CLINICAL DATA: Fall.  Weakness.  Hypertension. Former smoker.    
 CHEST - 2 VIEW:

[view not recorded]
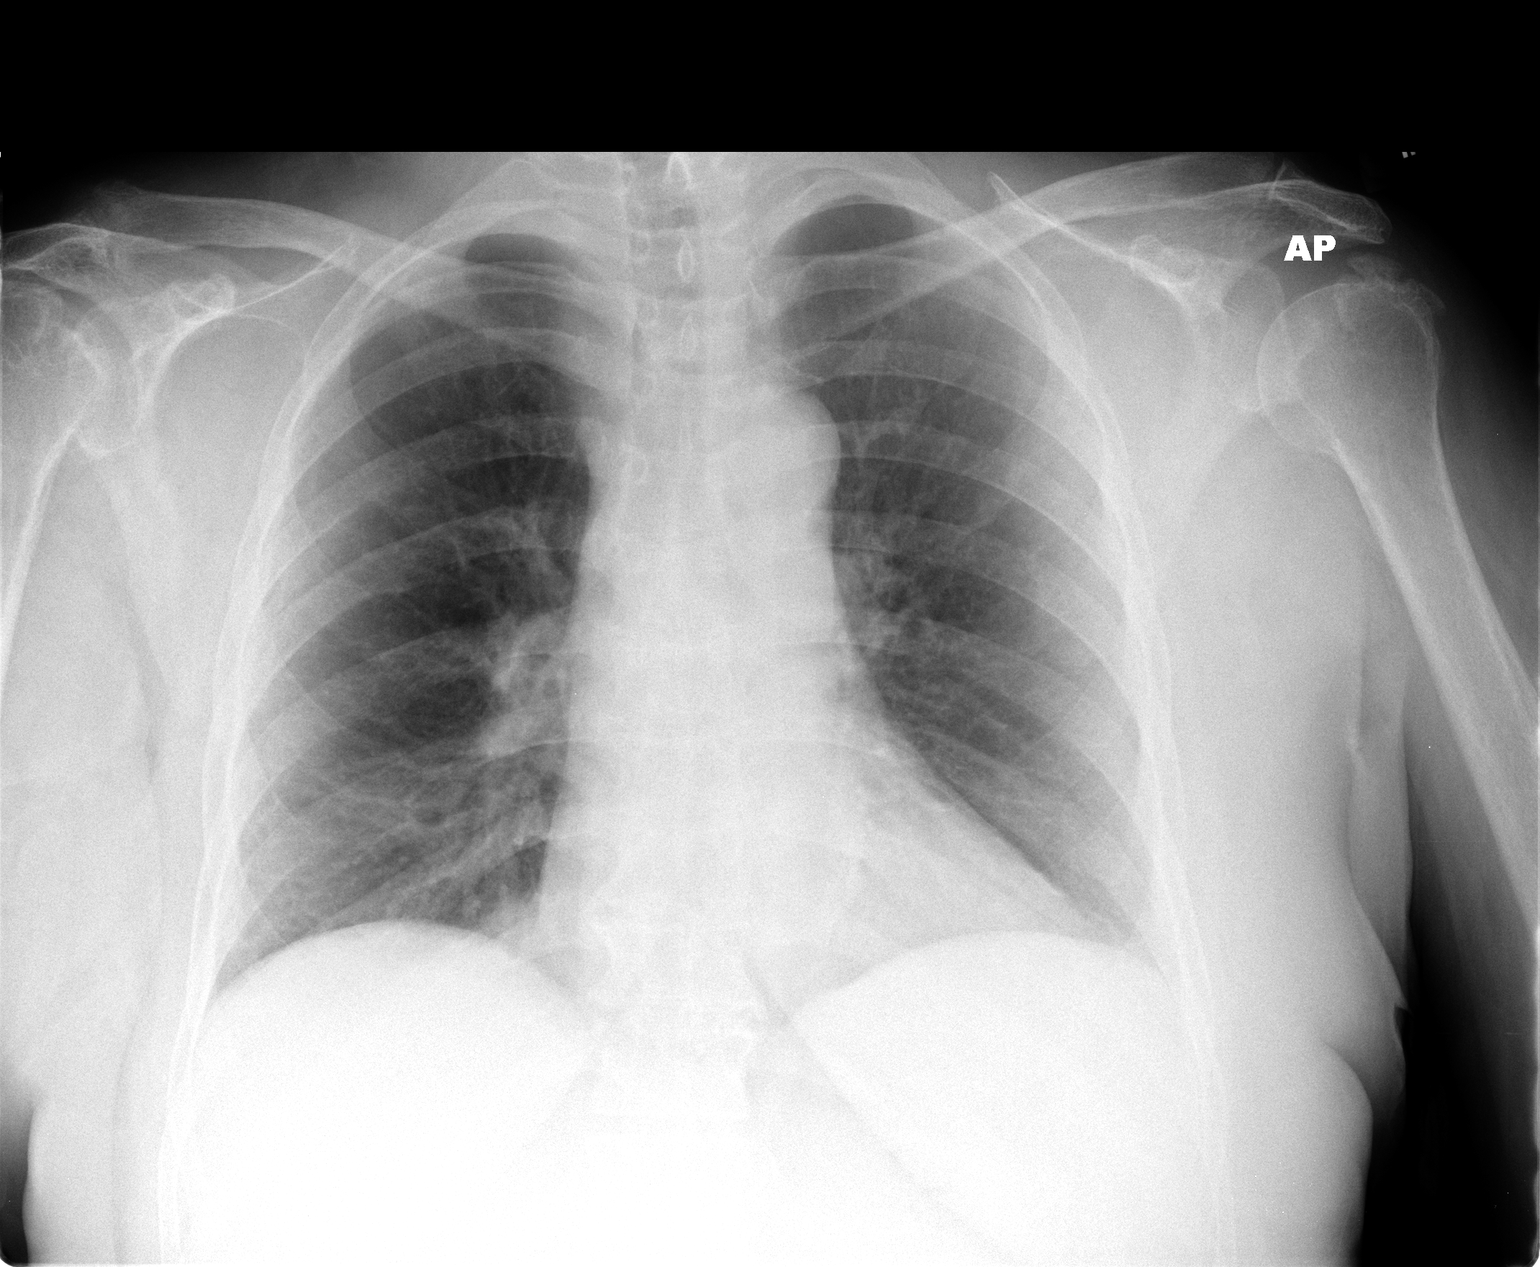

[1 of 1 positions shown; findings below may reference images not displayed]

FINDINGS: Heart size is normal.  There is no effusion or edema.  No airspace opacities are identified.  There are degenerative-type changes affecting both shoulder joints.
IMPRESSION: No active cardiopulmonary disease.

## 2008-06-19 IMAGING — CR DG HUMERUS 2V *L*
2 series · 2 of 2 positions shown · non-contrast
Comparison: none

CLINICAL DATA: 65-year-old female, rhabdomyolysis. 
 LEFT HUMERUS ? 2 VIEW:

[view not recorded (1 of 2)]
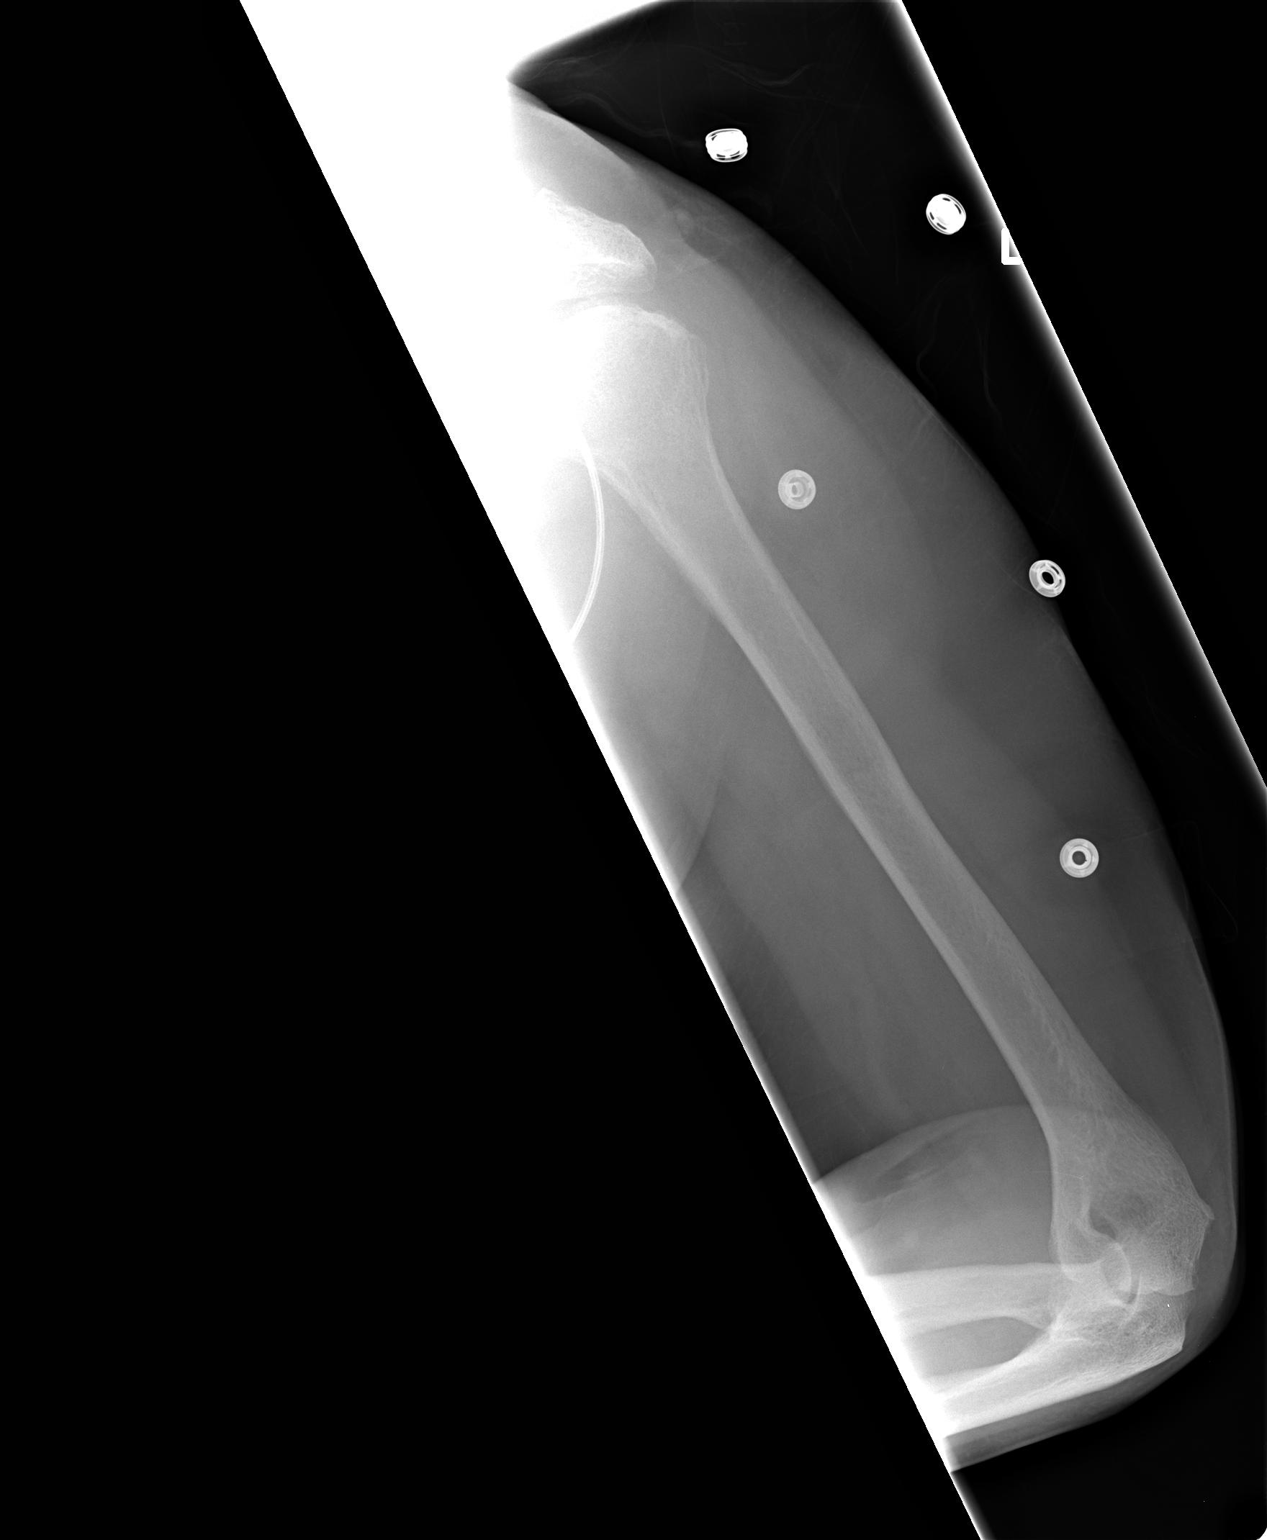

[view not recorded (2 of 2)]
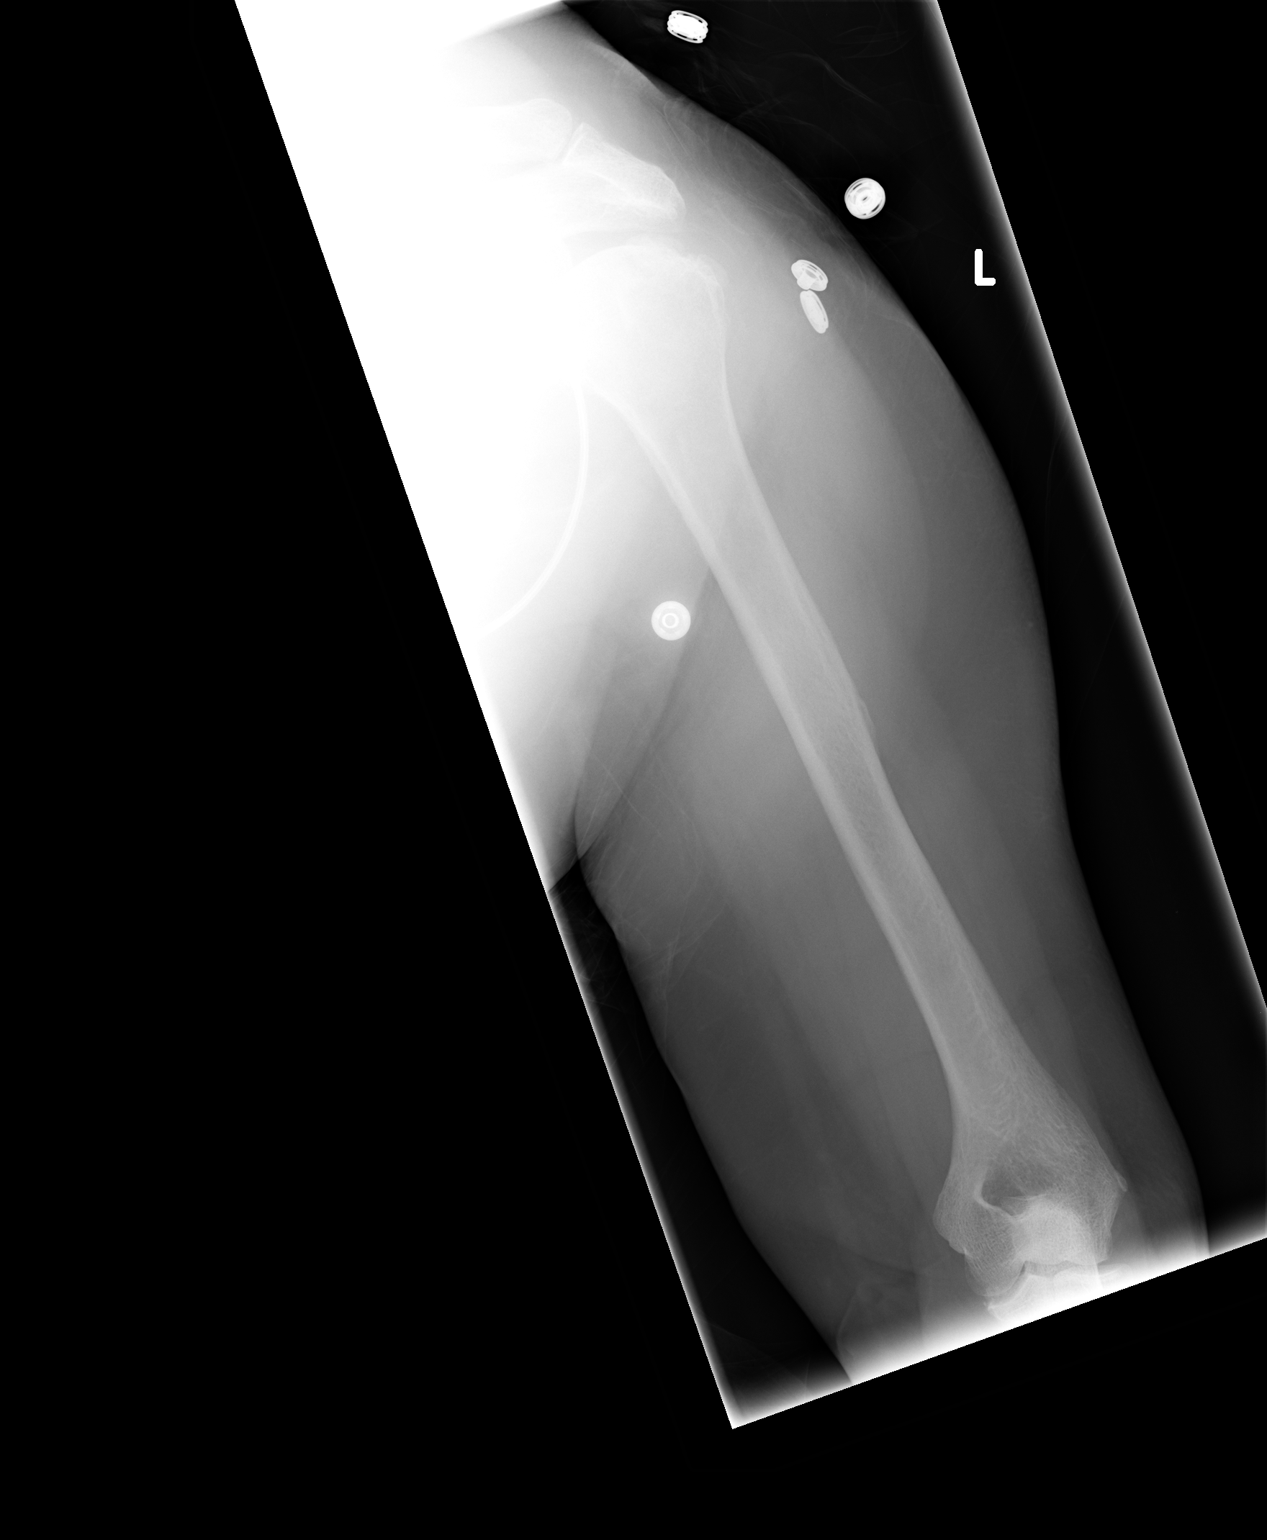

[2 of 2 positions shown; findings below may reference images not displayed]

FINDINGS: Calcific tendinopathy is seen at the rotator cuff.  The humerus is otherwise unremarkable.  There is some lucency within the proximal soft tissues of the forearm.  This may be due to overlap.
IMPRESSION: 1.  Degenerative changes rotator cuff. 
 2.  Lucency within the soft tissues of the forearm of undetermined significance.

## 2008-06-20 IMAGING — CR DG CHEST 1V PORT
1 series · 1 of 1 positions shown · non-contrast
Comparison: 07/21/06.

CLINICAL DATA: Rhabdomyolysis. 
 PORTABLE CHEST - 1 VIEW ? 07/21/06 AT 7720 HOURS:

[view not recorded]
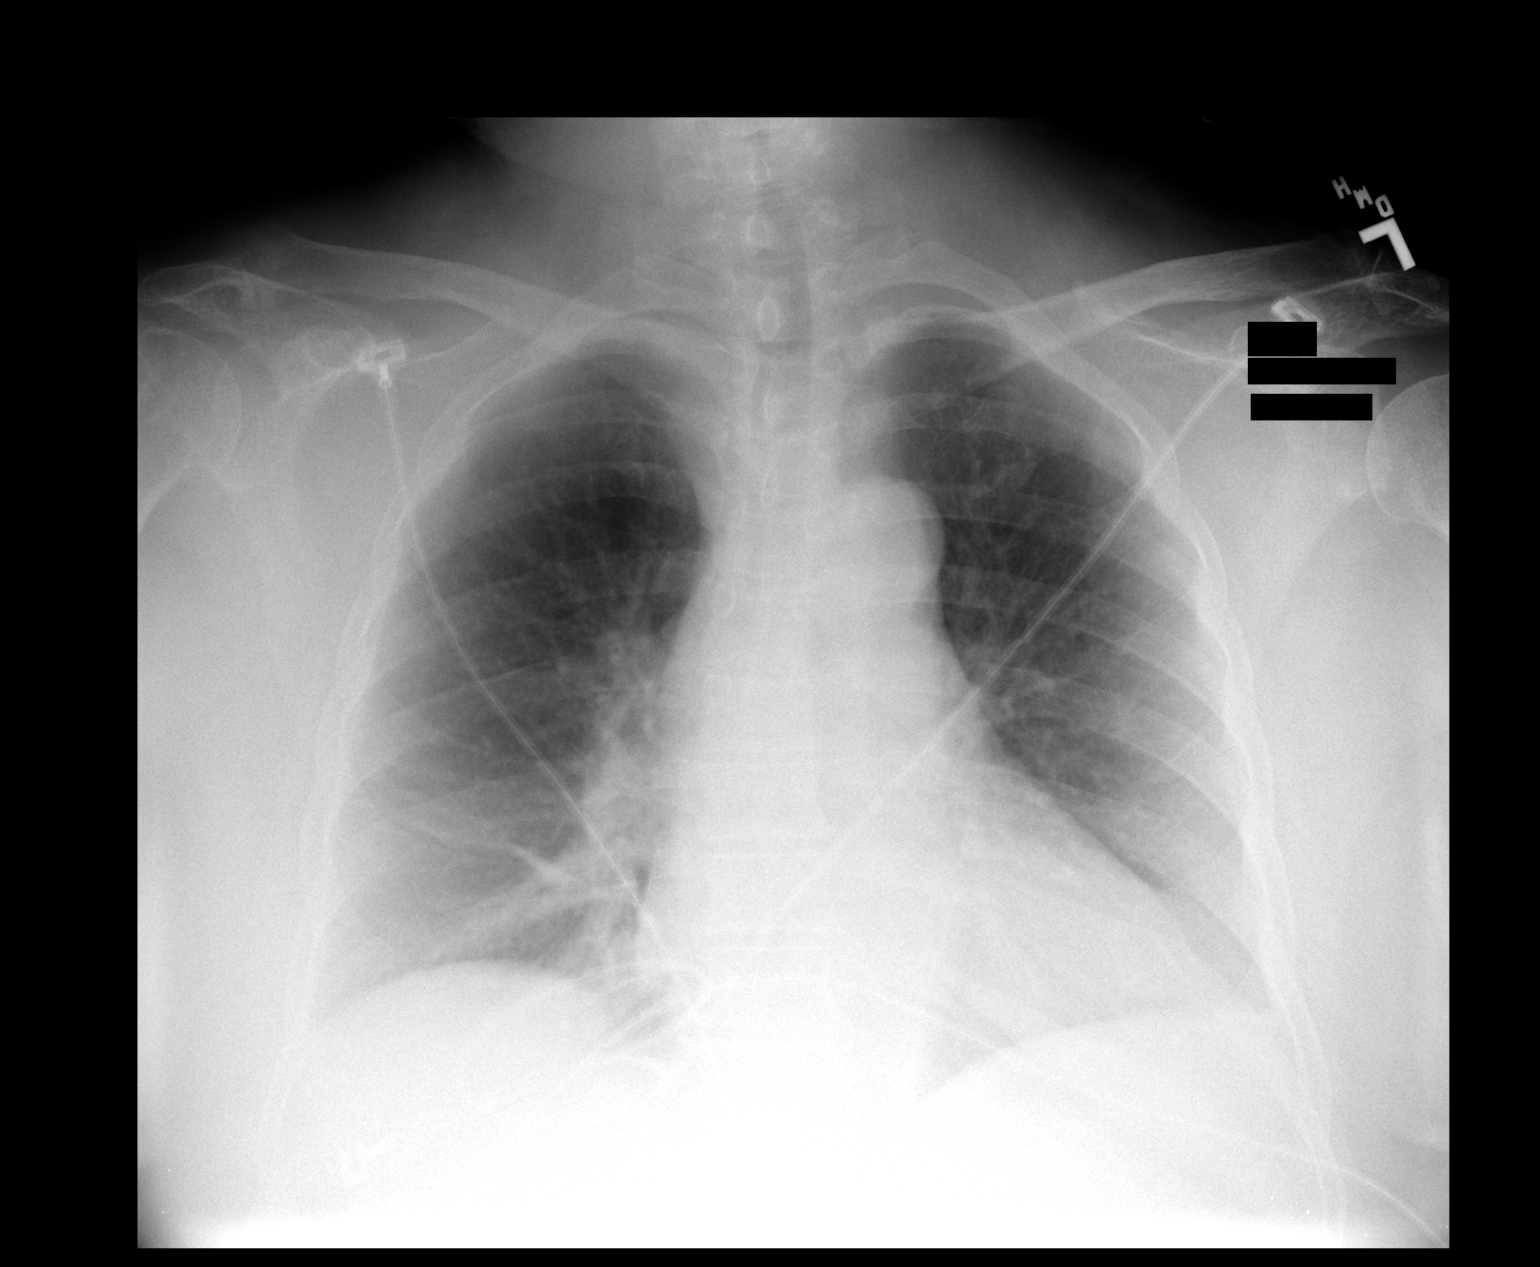

[1 of 1 positions shown; findings below may reference images not displayed]

FINDINGS: There are lower lung volumes with mild increase in bibasilar atelectasis.  No consolidation, edema, or pleural effusion is seen.  The heart size remains at the upper limits of normal for portable AP technique.
IMPRESSION: Bibasilar atelectasis. No consolidation.

## 2008-06-21 IMAGING — CR DG CHEST 2V
1 series · 1 of 1 positions shown · non-contrast
Comparison: none

HISTORY: Rhabdomyolysis, cough, congestion, nausea, fever, hypertension,
diabetes

[view not recorded]
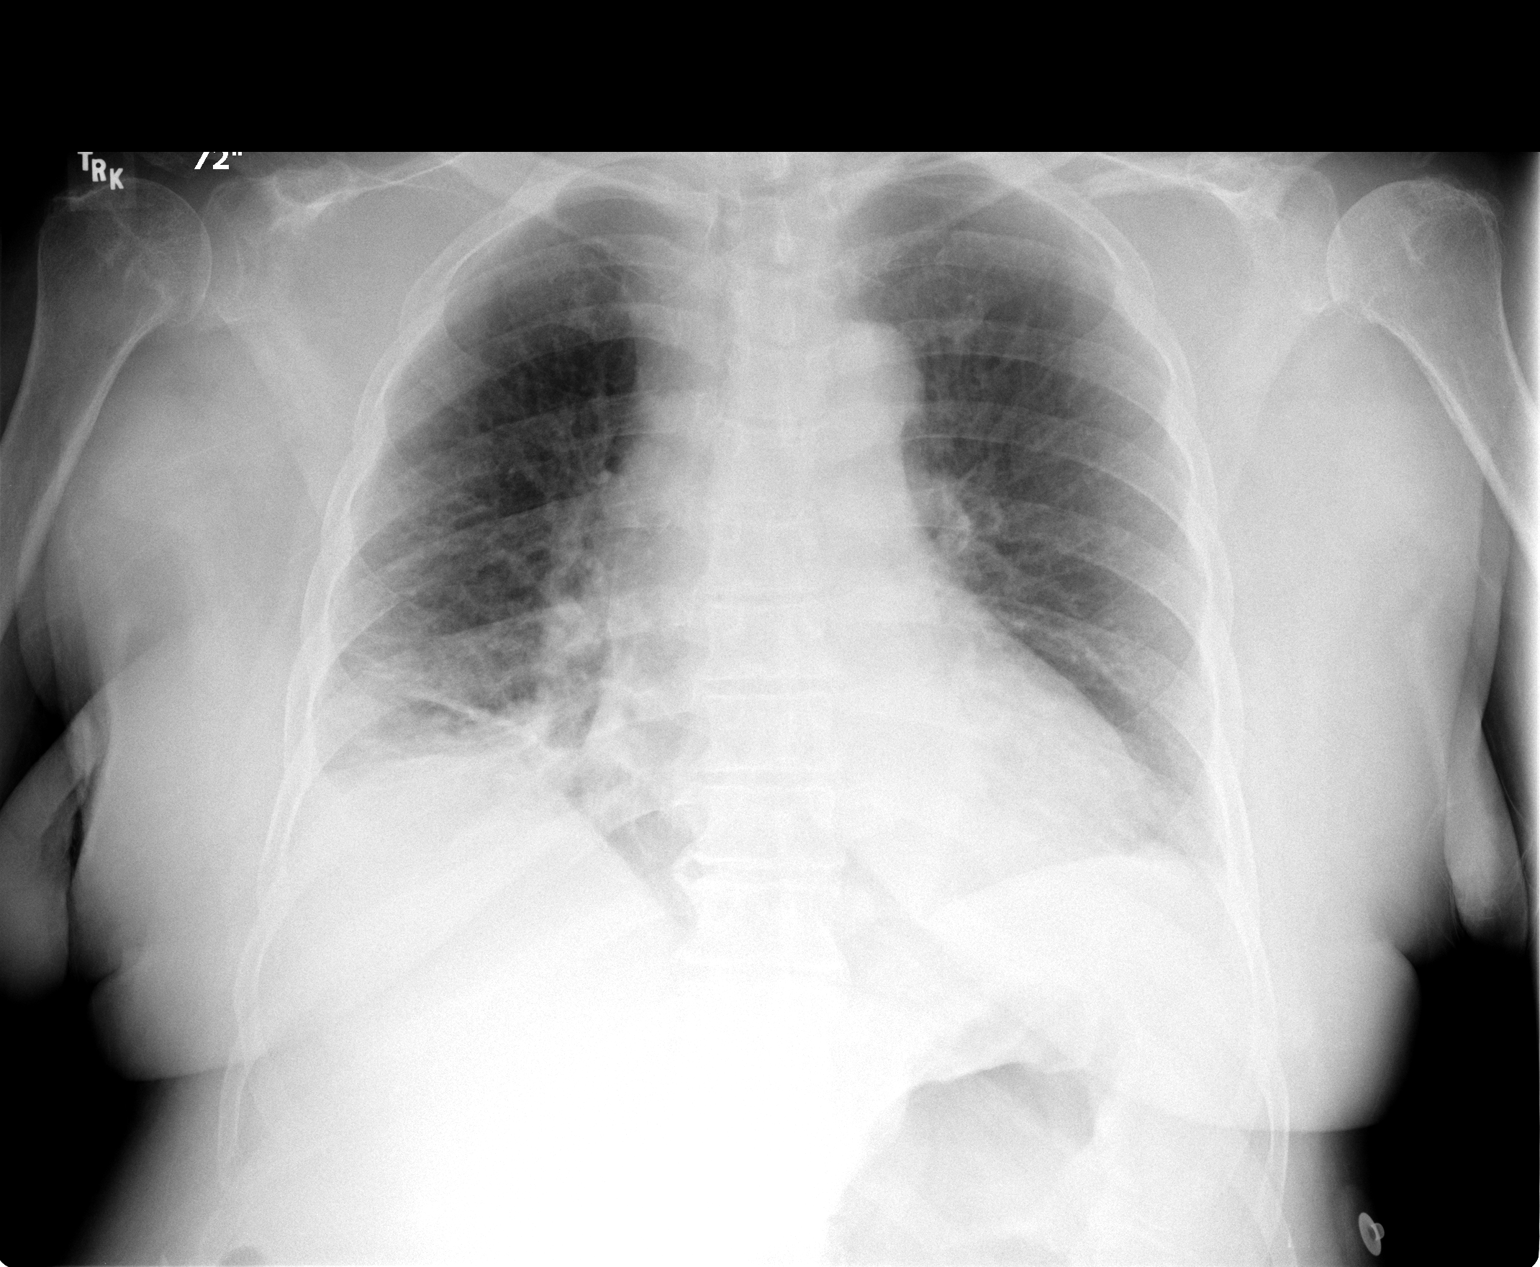

[1 of 1 positions shown; findings below may reference images not displayed]

CHEST 2 VIEWS:

Comparison to [DATE]

Cardiac enlargement.
Stable mediastinum with mild elongation of aorta.
Vascular markings normal.
Mildly decreased lung volumes with crowding of perihilar markings and bibasilar
atelectasis, increased on right since previous study. 
Small bibasilar pleural effusions. 
Respiratory motion artifacts degrade lateral views despite repeating.
IMPRESSION: Cardiomegaly with bronchitic changes and increased bibasilar atelectasis on
right.
Small bibasilar effusions.

## 2008-06-22 IMAGING — CR DG CHEST 1V PORT
1 series · 1 of 1 positions shown · non-contrast
Comparison: none

CLINICAL DATA: Rhabdomyolysis.  Reason for Exam:   Rule out pulmonary edema.
PORTABLE CHEST - 1 VIEW - 07/23/06 AT 8222 HOURS:
Comparison is made to yesterday?s exam.

[view not recorded]
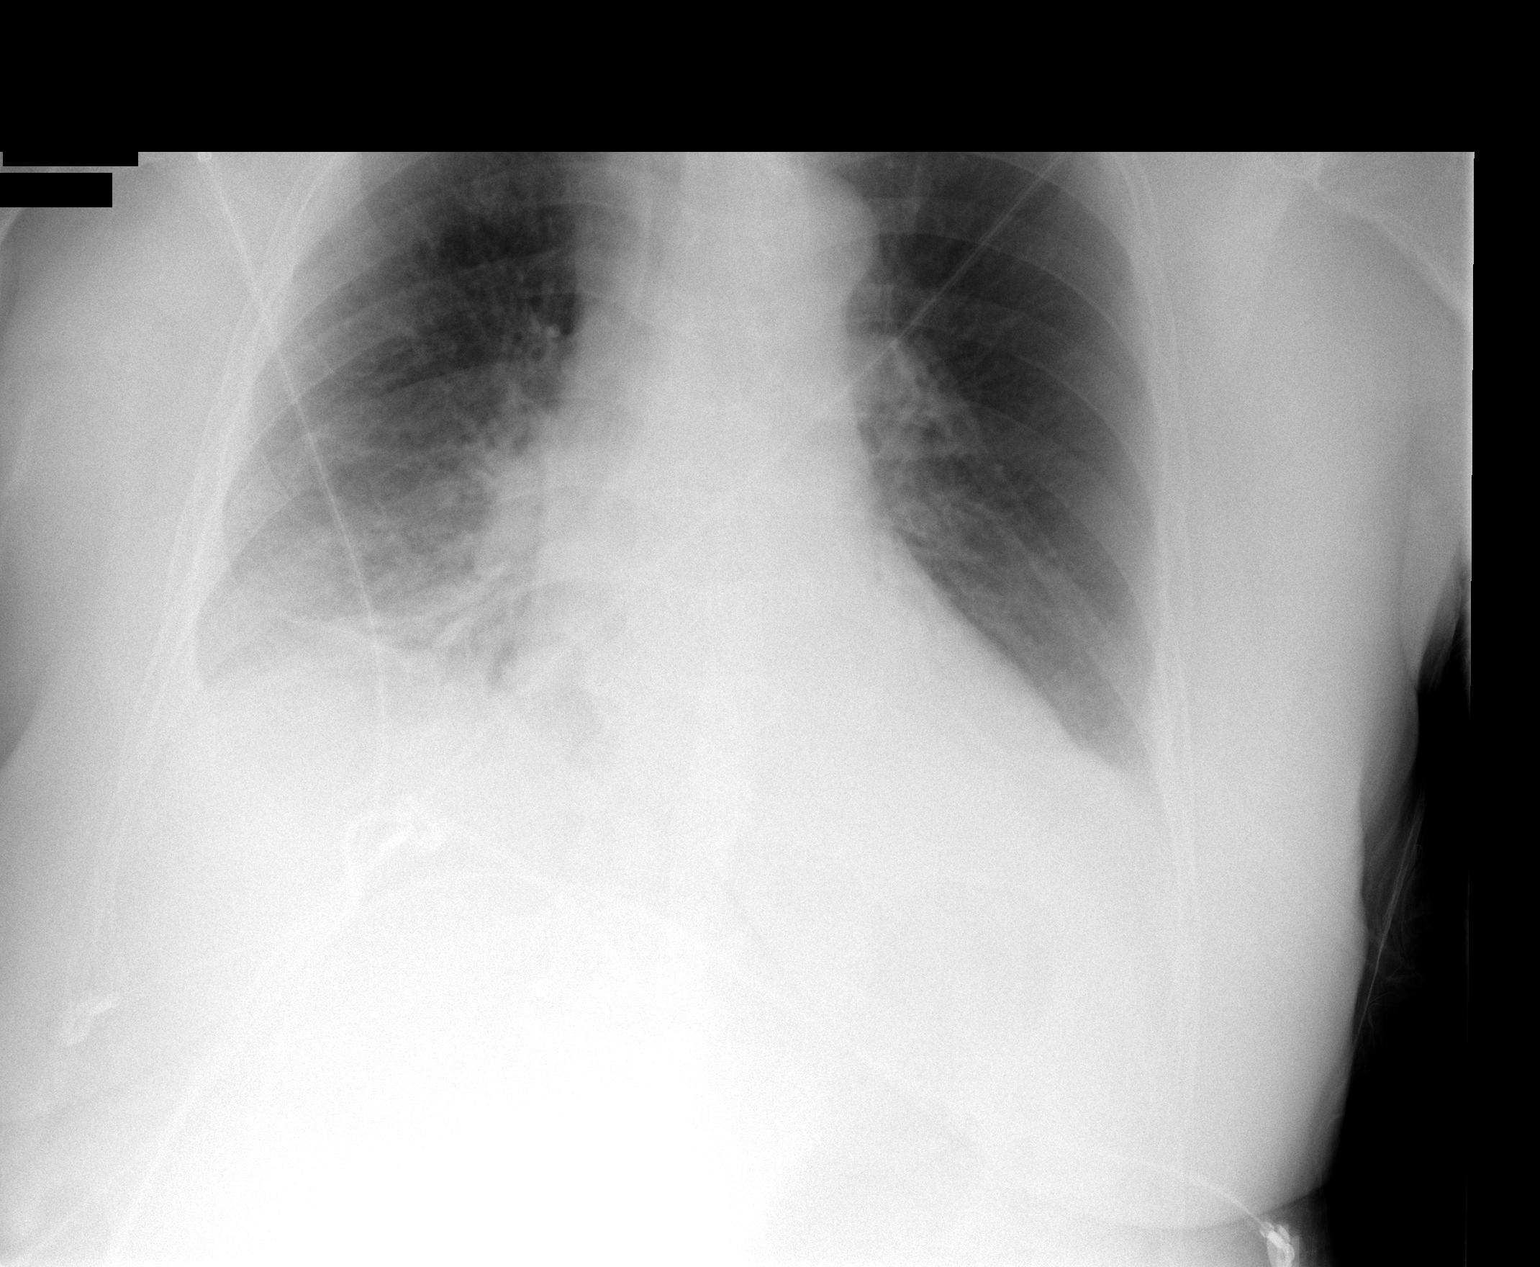

[1 of 1 positions shown; findings below may reference images not displayed]

FINDINGS: There appears to be increased retrocardiac density on the left compatible with at least partial left lower lobar atelectasis/consolidation.  ight base density is again noted in part representing subsegmental atelectasis.  It is possible that there is pneumonia at the right base as well.  There is a small right pleural effusion.  Cardiomegaly.  There is no pulmonary edema.
IMPRESSION: Specifically, negative for pulmonary edema.
No significant change in right base atelectasis/infiltrate and small right pleural effusion.
Suspicion for left retrocardiac density, possibly left lower lobar atelectasis/consolidation.

## 2008-06-24 IMAGING — US US RENAL
1 series · 14 of 24 positions shown · non-contrast
Comparison: None.

CLINICAL DATA: Renal insufficiency. Diabetes. Hypertension.

RENAL/URINARY TRACT ULTRASOUND:
TECHNIQUE: Complete ultrasound examination of the urinary tract was performed
including evaluation of the kidneys, renal collecting systems, and urinary
bladder.

[Series 1: unknown · 0.30mm/px · 14 of 24 slices shown]
[im 1/24]
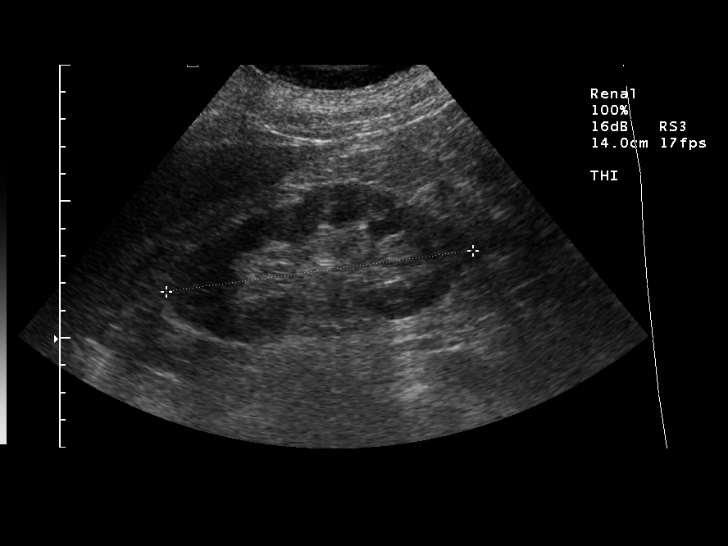
[im 3/24]
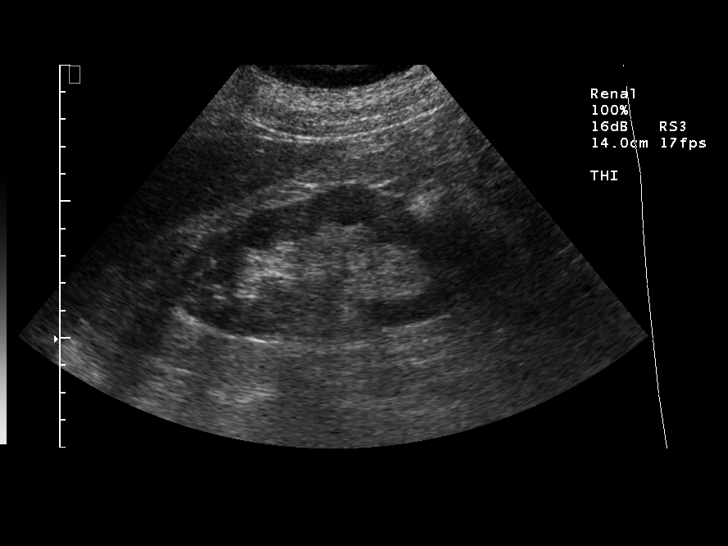
[im 5/24]
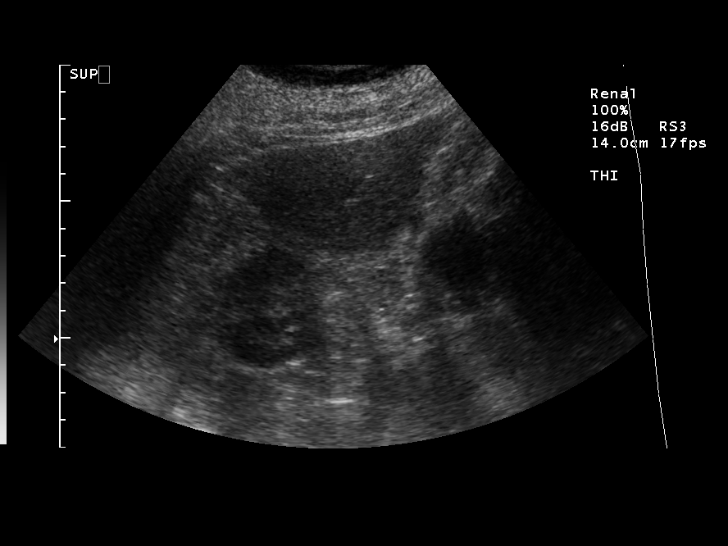
[im 7/24]
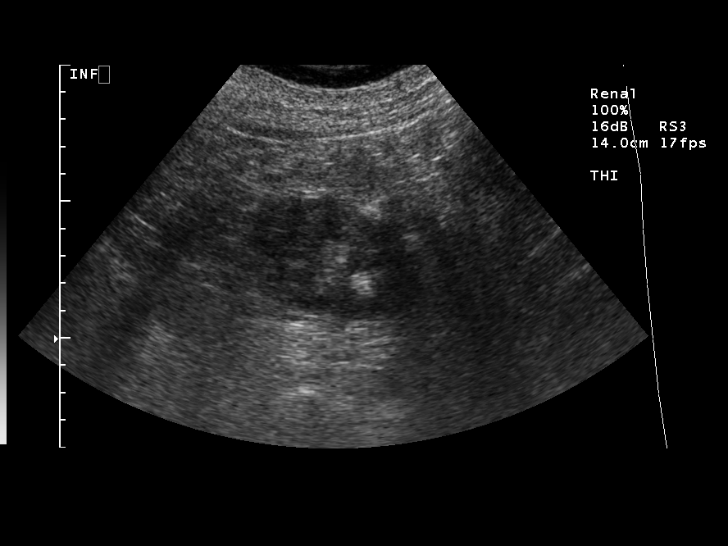
[im 8/24]
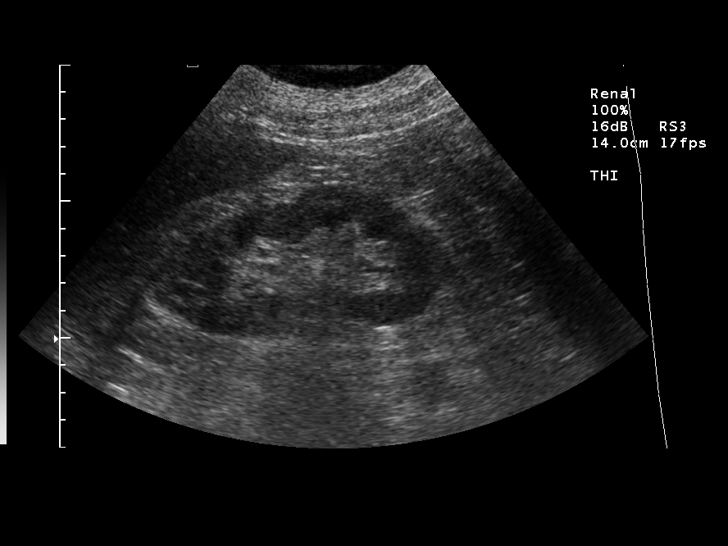
[im 10/24]
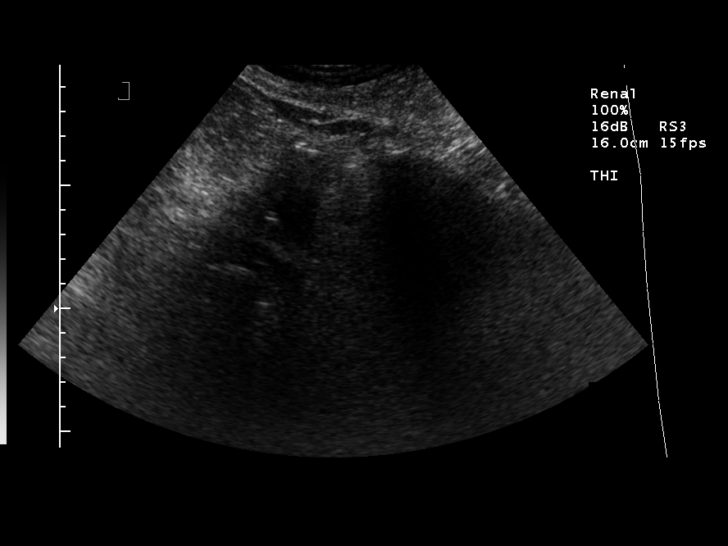
[im 12/24]
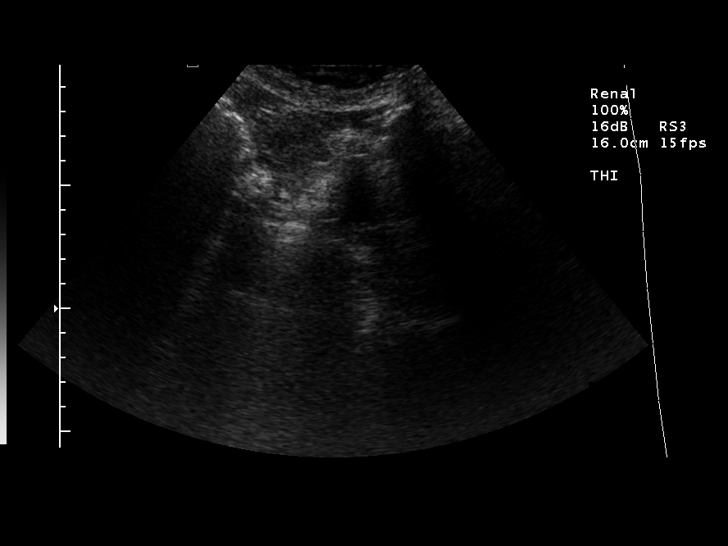
[im 13/24]
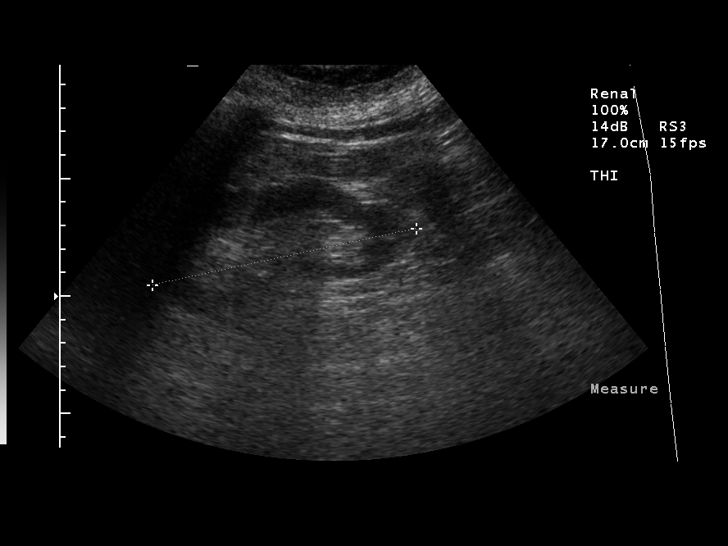
[im 15/24]
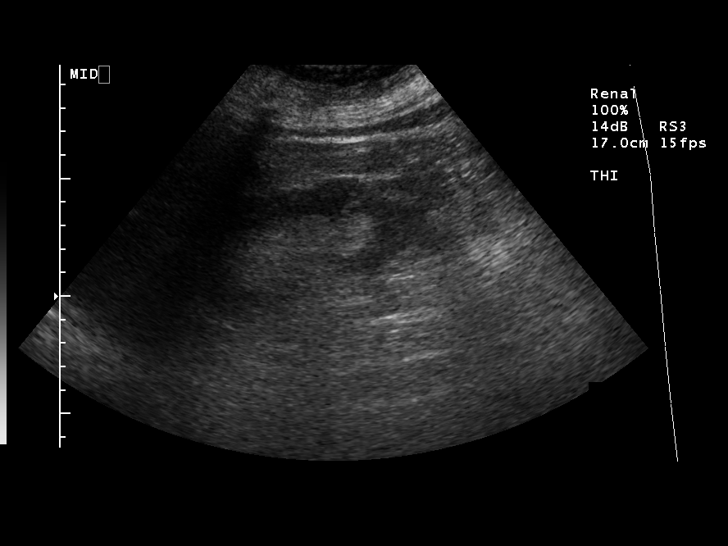
[im 17/24]
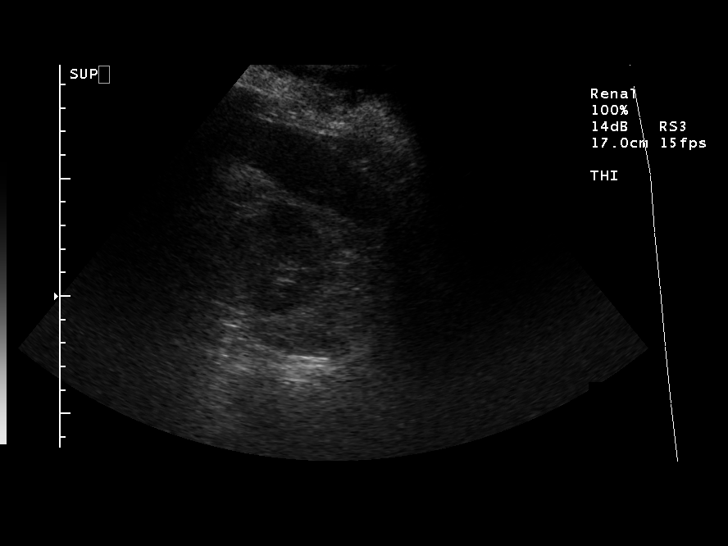
[im 19/24]
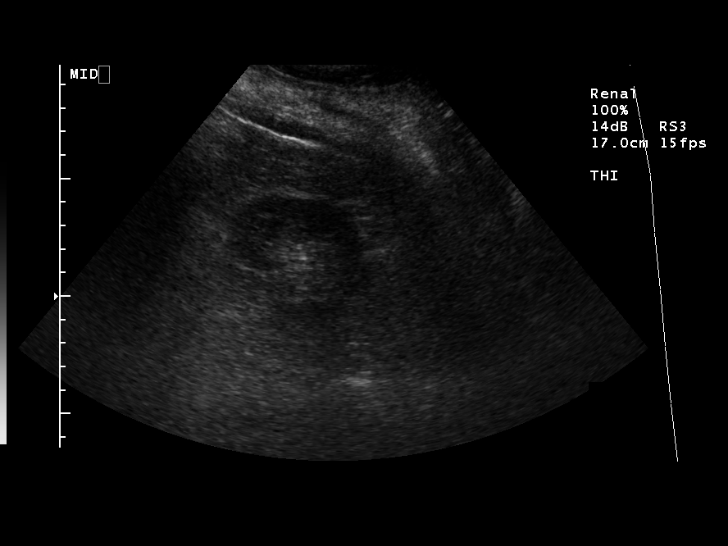
[im 20/24]
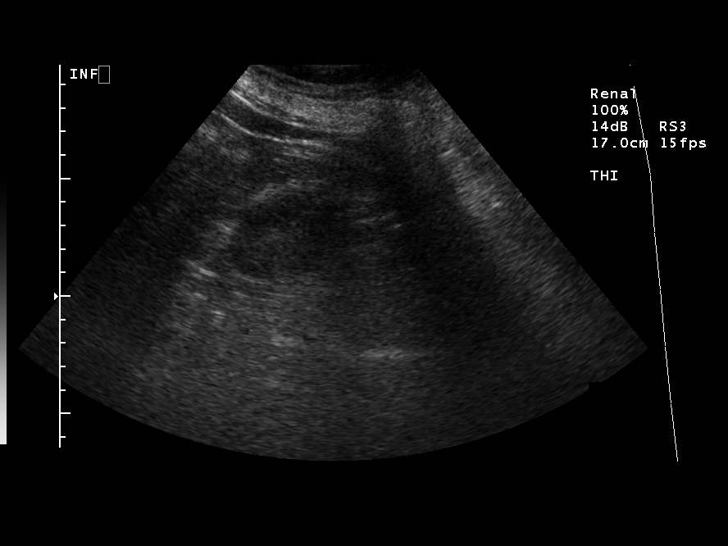
[im 22/24]
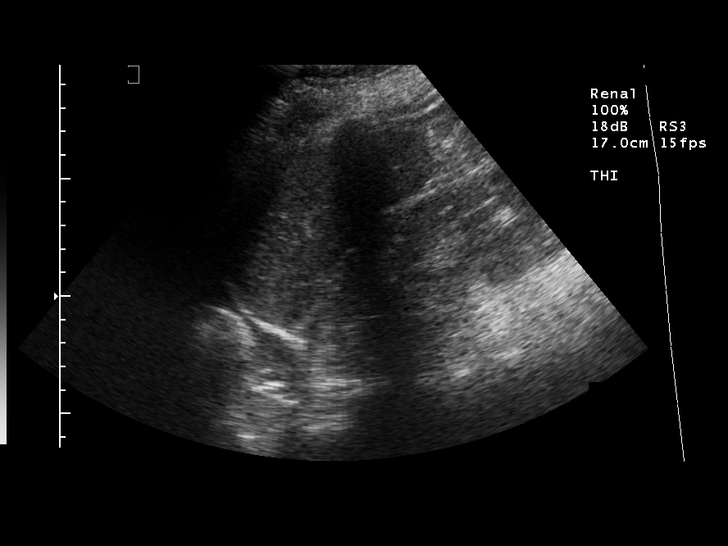
[im 24/24]
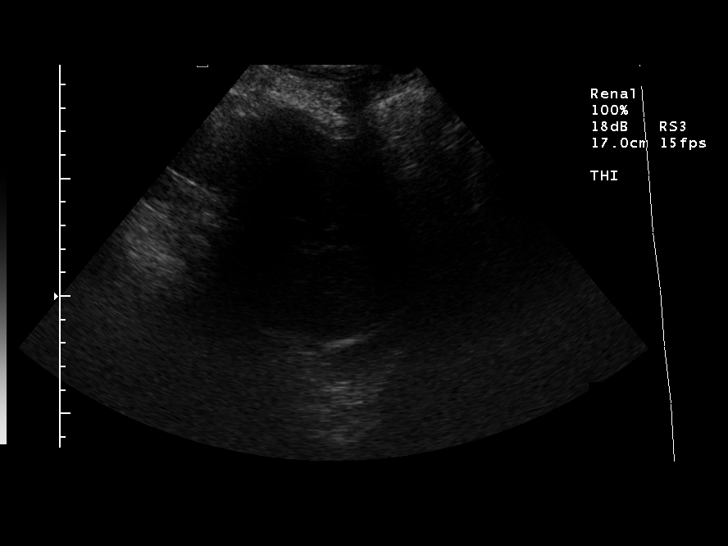

[14 of 24 positions shown; findings below may reference images not displayed]

FINDINGS: The kidneys are normal in size, shape and echotexture. The right
kidney measures 11.3 cm in length and the left kidney measures 11.5 cm in
length. Non-visualized urinary bladder, with a Foley catheter in place. No
masses, calculi or hydronephrosis. Small left pleural effusion.
IMPRESSION: 1. Non-visualized urinary bladder, due to a Foley catheter in place.
2. Normal appearing kidneys without hydronephrosis.
3. Small left pleural effusion.

## 2008-06-25 IMAGING — CR DG CHEST 2V
1 series · 1 of 1 positions shown · non-contrast
Comparison: 07/23/06.

CLINICAL DATA: Rhabdomyolysis.   Evaluate infiltrate. 
 CHEST - 2 VIEW:

[view not recorded]
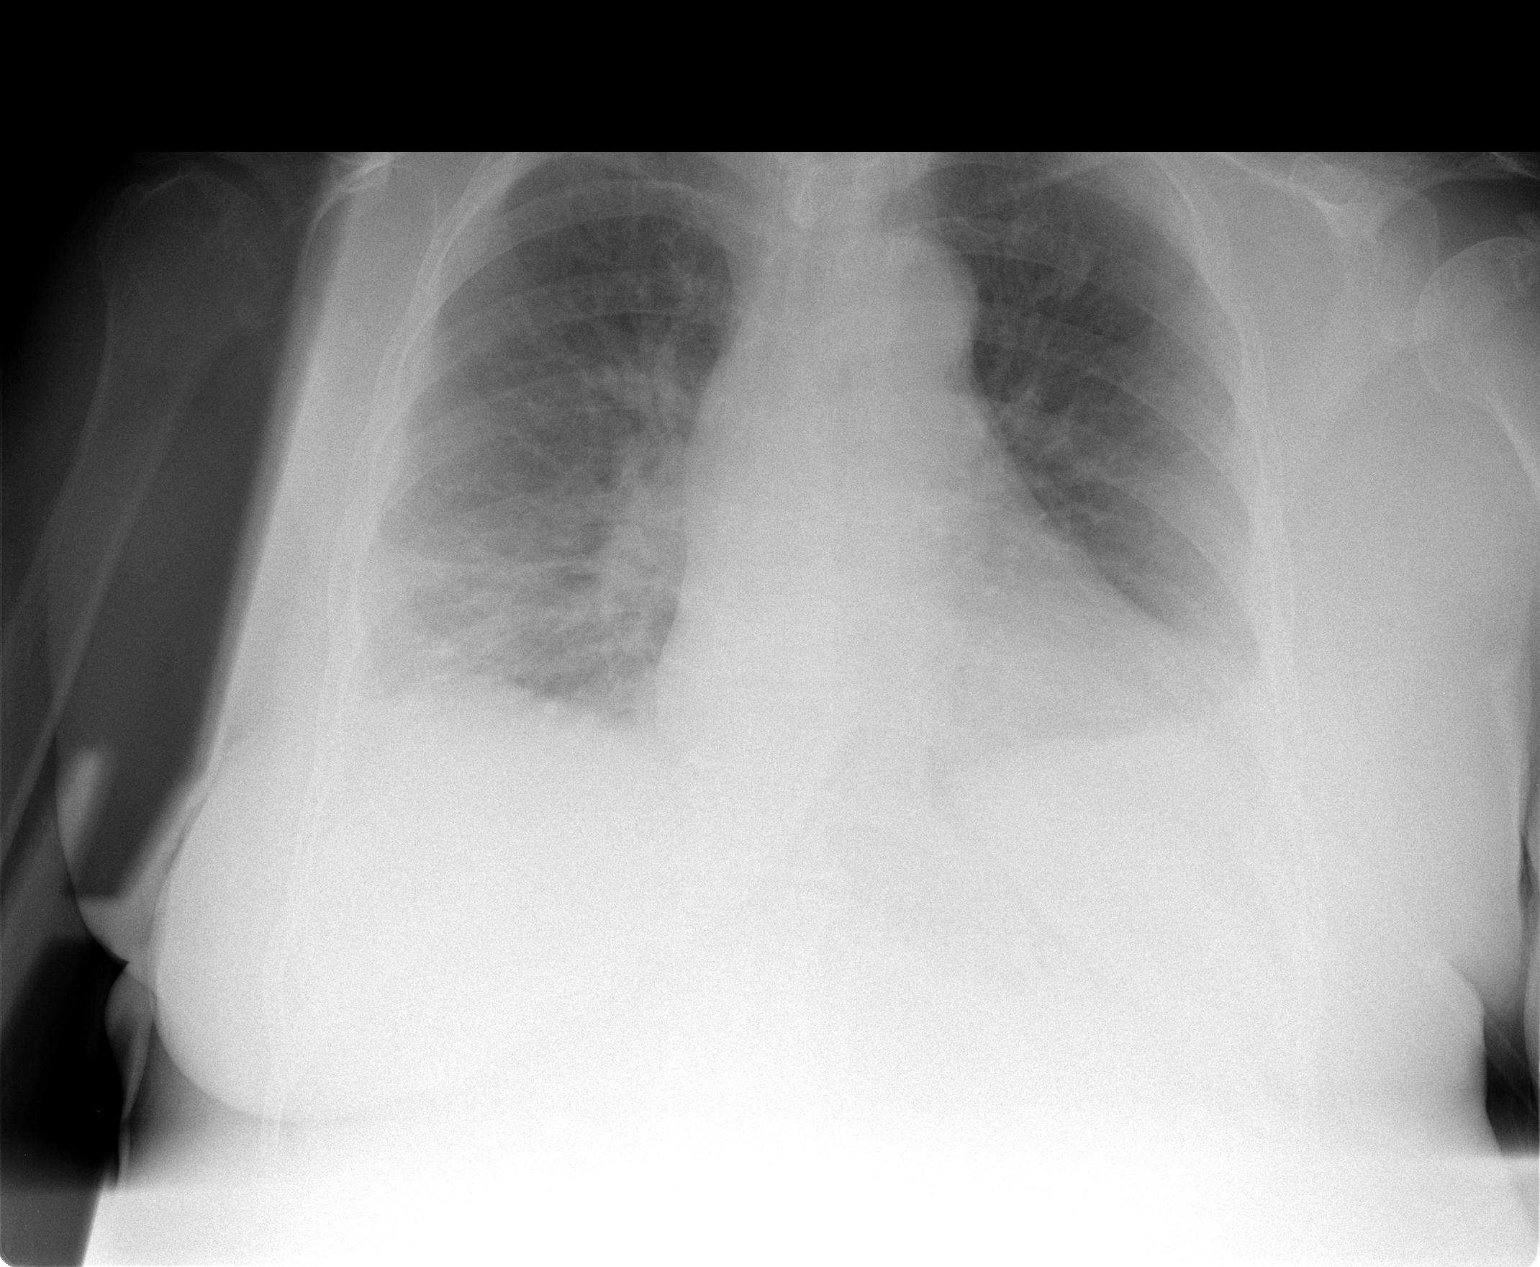

[1 of 1 positions shown; findings below may reference images not displayed]

FINDINGS: The heart size is mildly enlarged.  There are bilateral pleural effusions and pulmonary edema.  
 Atelectasis is seen at both lung bases.
IMPRESSION: 1.  Increased effusions and mild edema.
 2.  Bibasilar atelectasis.

## 2008-06-25 IMAGING — CR DG NECK SOFT TISSUE
1 series · 1 of 1 positions shown · non-contrast
Comparison: none

CLINICAL DATA: Rhabdomyolysis.
 SINGLE LATERAL VIEW SOFT TISSUE NECK:

[w soft tissue neck]
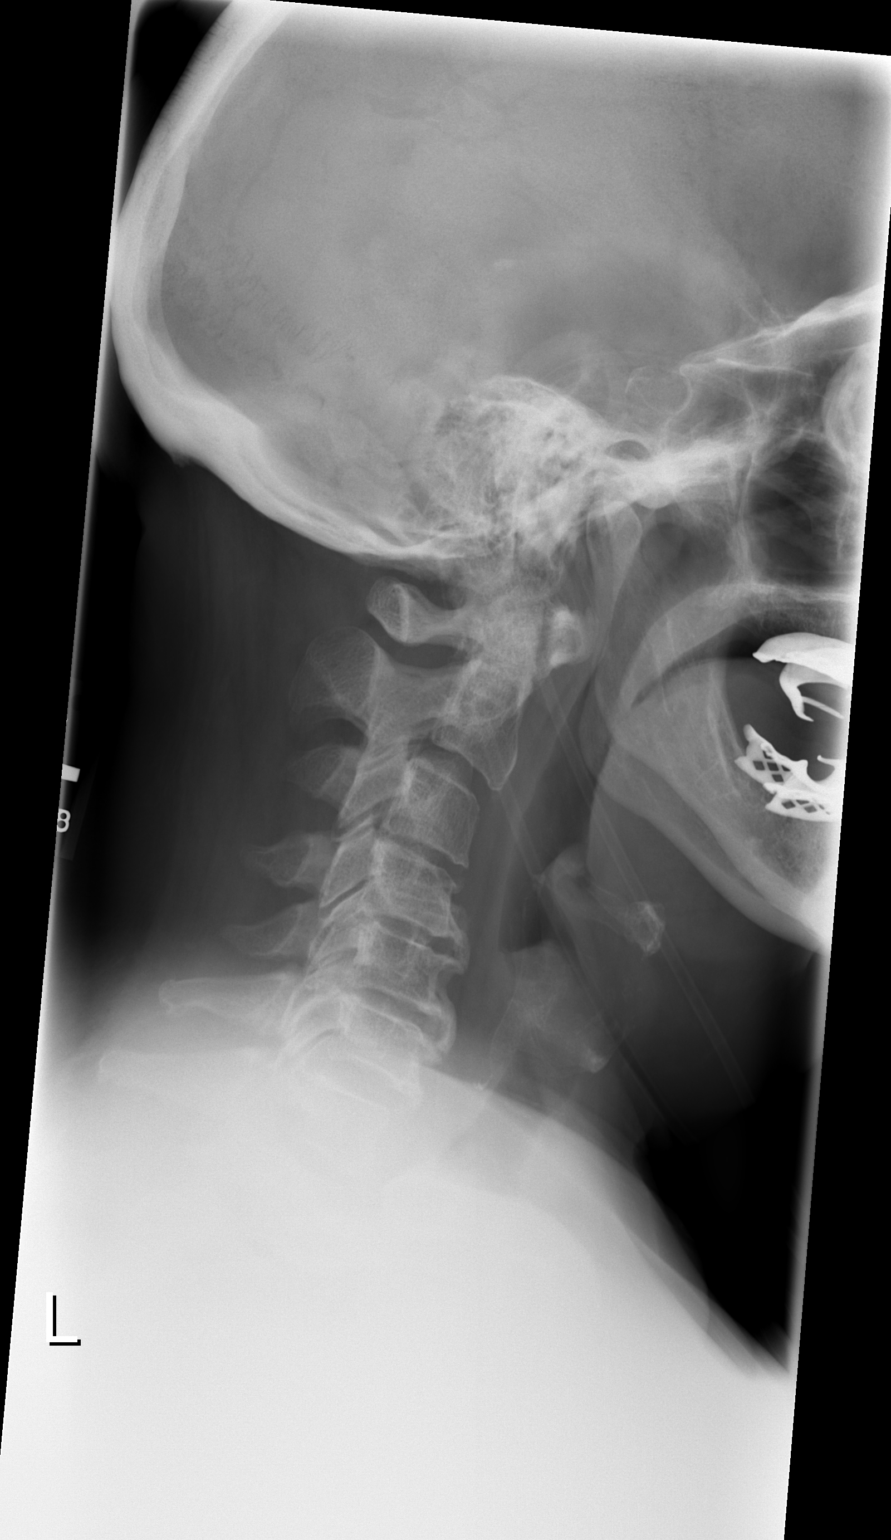

[1 of 1 positions shown; findings below may reference images not displayed]

FINDINGS: There is straightening of the normal cervical lordosis.  Cervical spondylosis from C4 through C6 with anterior osteophytosis and loss of vertebral body height.  The disk spaces are maintained.  The prevertebral soft tissue stripe is within normal limits.  
 If there is concern regarding a fracture or dislocation, a full cervical spine series is recommended.
IMPRESSION: Cervical spondylosis from C4 through C6.

## 2008-06-26 ENCOUNTER — Telehealth: Payer: Self-pay | Admitting: *Deleted

## 2008-06-27 ENCOUNTER — Ambulatory Visit: Payer: Self-pay | Admitting: Family Medicine

## 2008-06-27 ENCOUNTER — Encounter (INDEPENDENT_AMBULATORY_CARE_PROVIDER_SITE_OTHER): Payer: Self-pay | Admitting: Family Medicine

## 2008-06-27 LAB — CONVERTED CEMR LAB
Bilirubin Urine: NEGATIVE
Blood in Urine, dipstick: NEGATIVE
Microalbumin U total vol: NEGATIVE mg/L
Protein, U semiquant: NEGATIVE
Specific Gravity, Urine: 1.01
pH: 5.5

## 2008-06-27 IMAGING — CR DG CHEST 2V
2 series · 2 of 2 positions shown · non-contrast
Comparison: 07/26/06.

CLINICAL DATA: Rhabdomyolysis.  Shortness of breath.  
 CHEST - 2 VIEW - 07/28/06:

[view not recorded (1 of 2)]
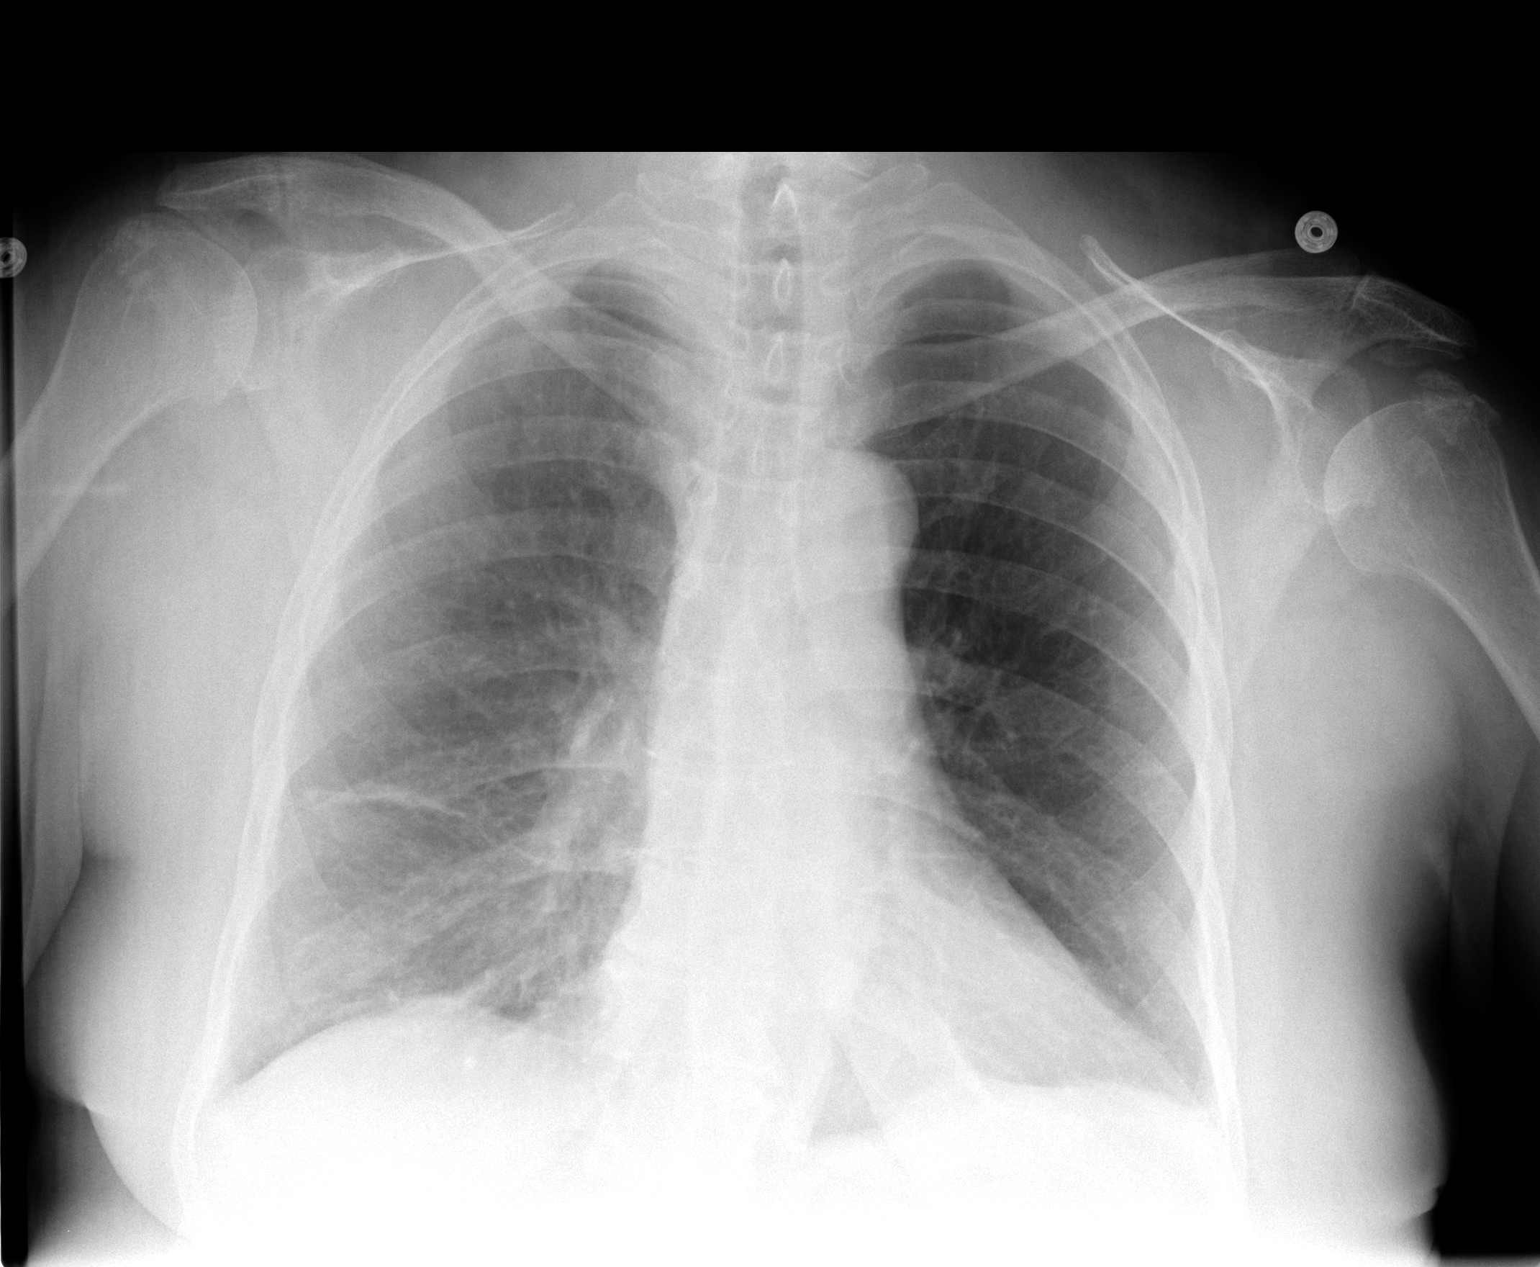

[view not recorded (2 of 2)]
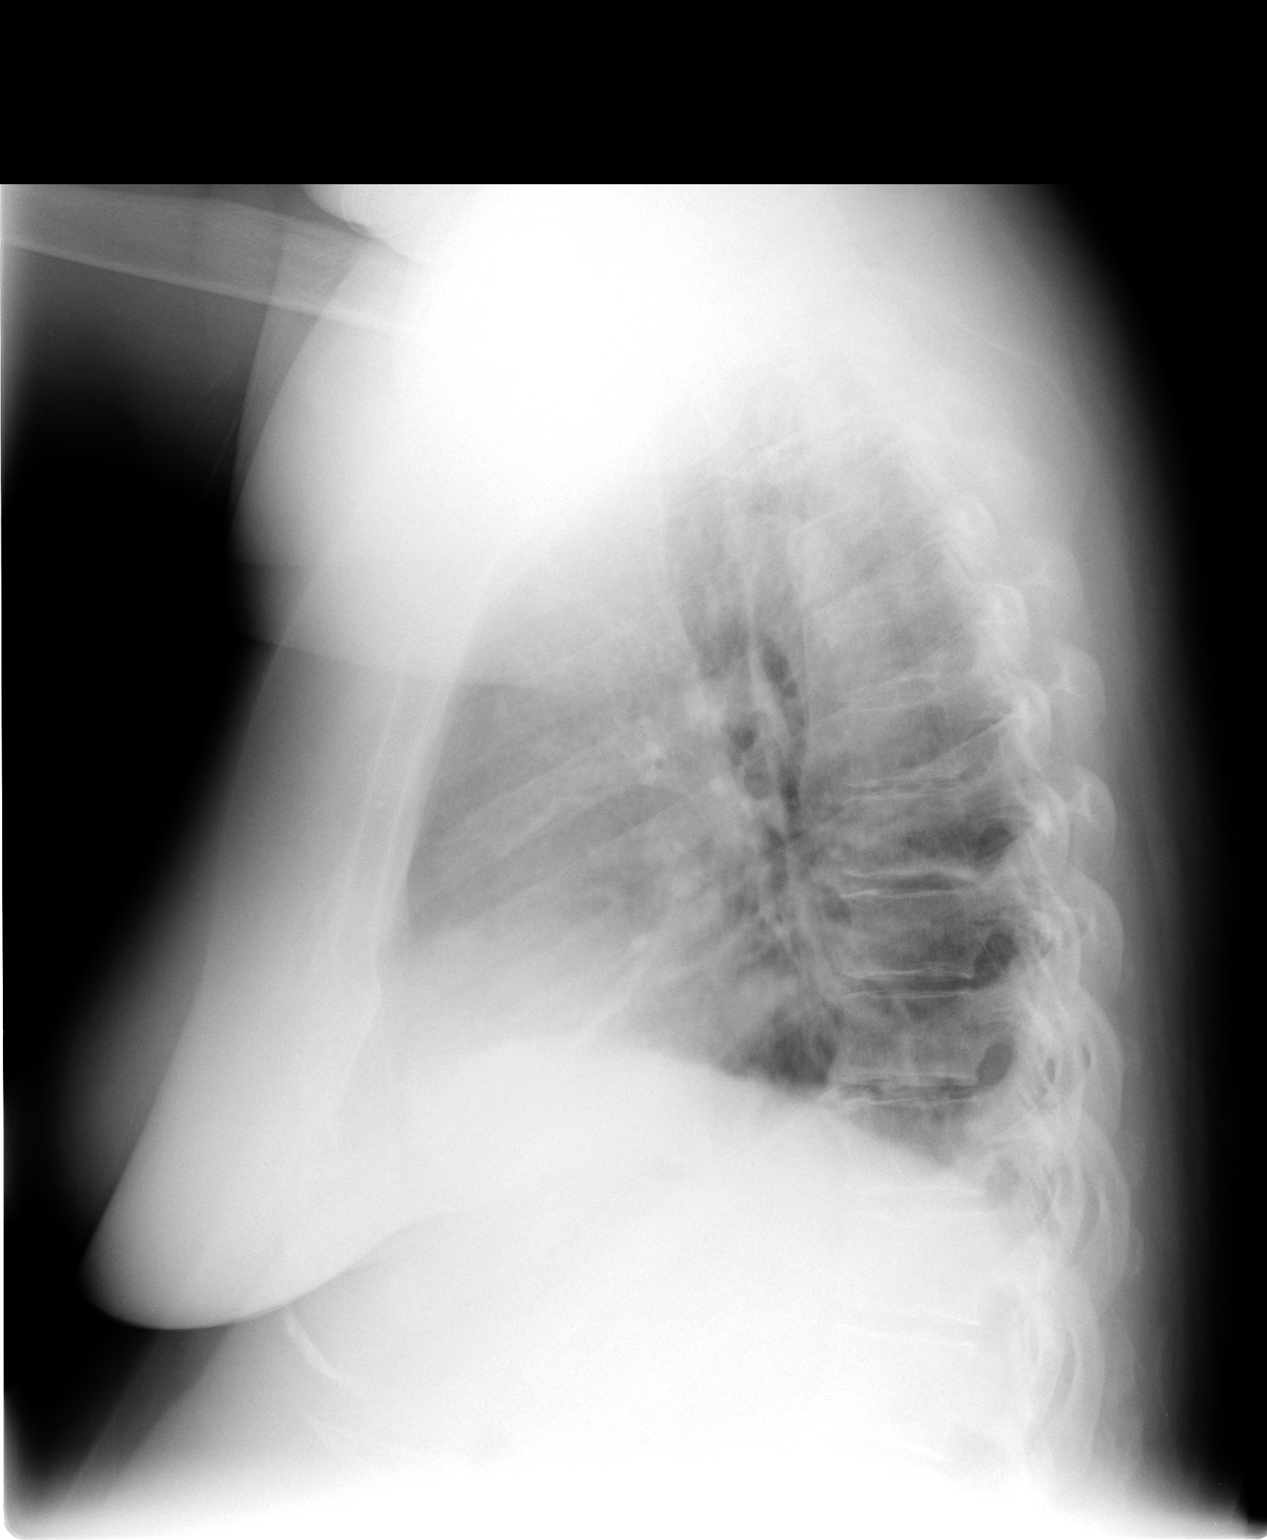

[2 of 2 positions shown; findings below may reference images not displayed]

FINDINGS: The heart size is normal.   There has been interval improvement in pulmonary edema and effusions.  Atelectasis is identified within the right midlung. 
 There is platelike atelectasis at the left base.
IMPRESSION: 1.   Interval decrease in effusions and edema pattern.
 2.  Left base and right midlung atelectasis.

## 2008-06-28 LAB — CONVERTED CEMR LAB
RDW: 13.8 % (ref 11.5–15.5)
Retic Ct Pct: 1.9 % (ref 0.4–3.1)
Saturation Ratios: 21 % (ref 20–55)
WBC: 8.5 10*3/uL (ref 4.0–10.5)

## 2008-06-30 IMAGING — CR DG CHEST 2V
2 series · 2 of 2 positions shown · non-contrast
Comparison: 07/28/2006

CLINICAL DATA: Rhabdomyolysis.  Cough.
 CHEST - 2 VIEW:

[w chest ap]
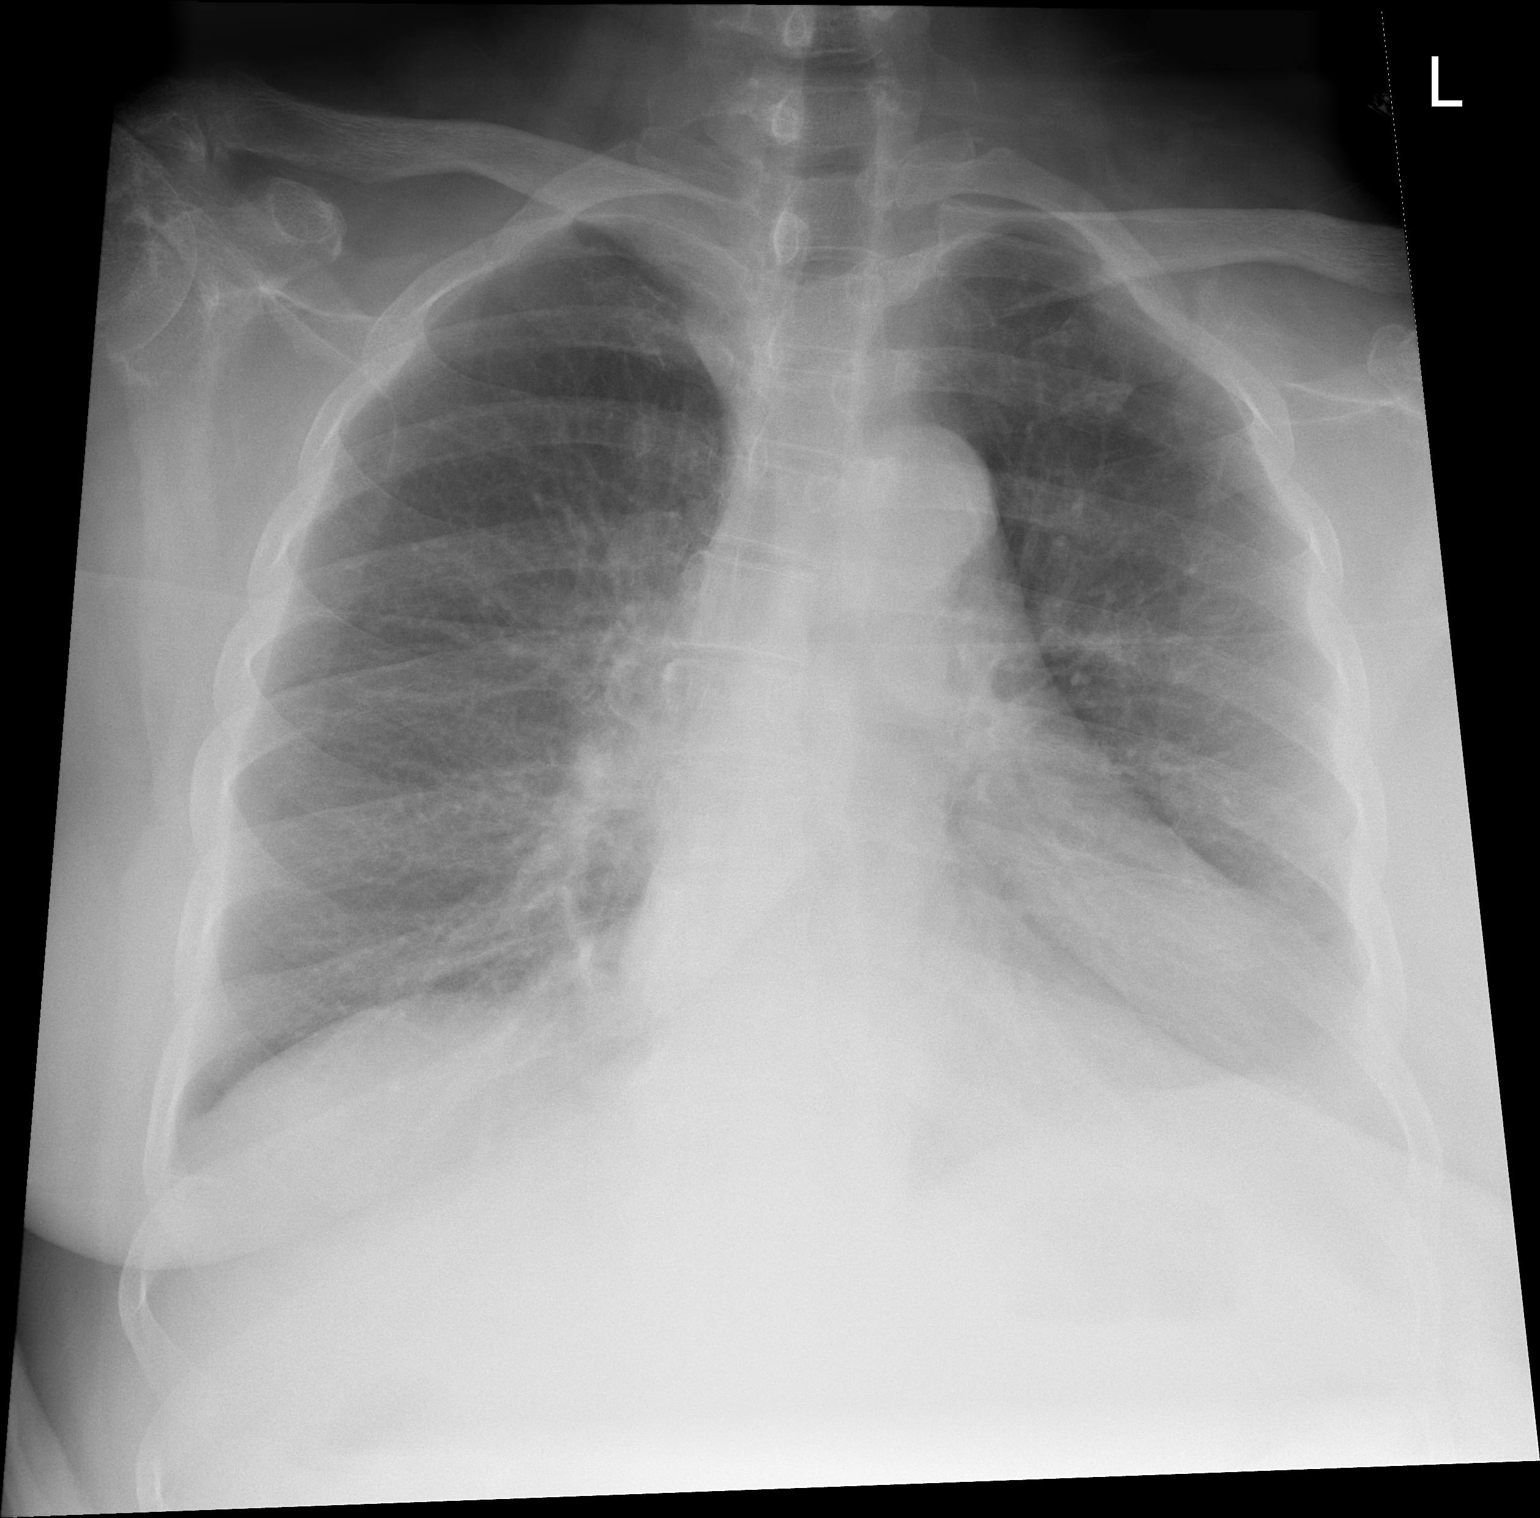

[w chest lat]
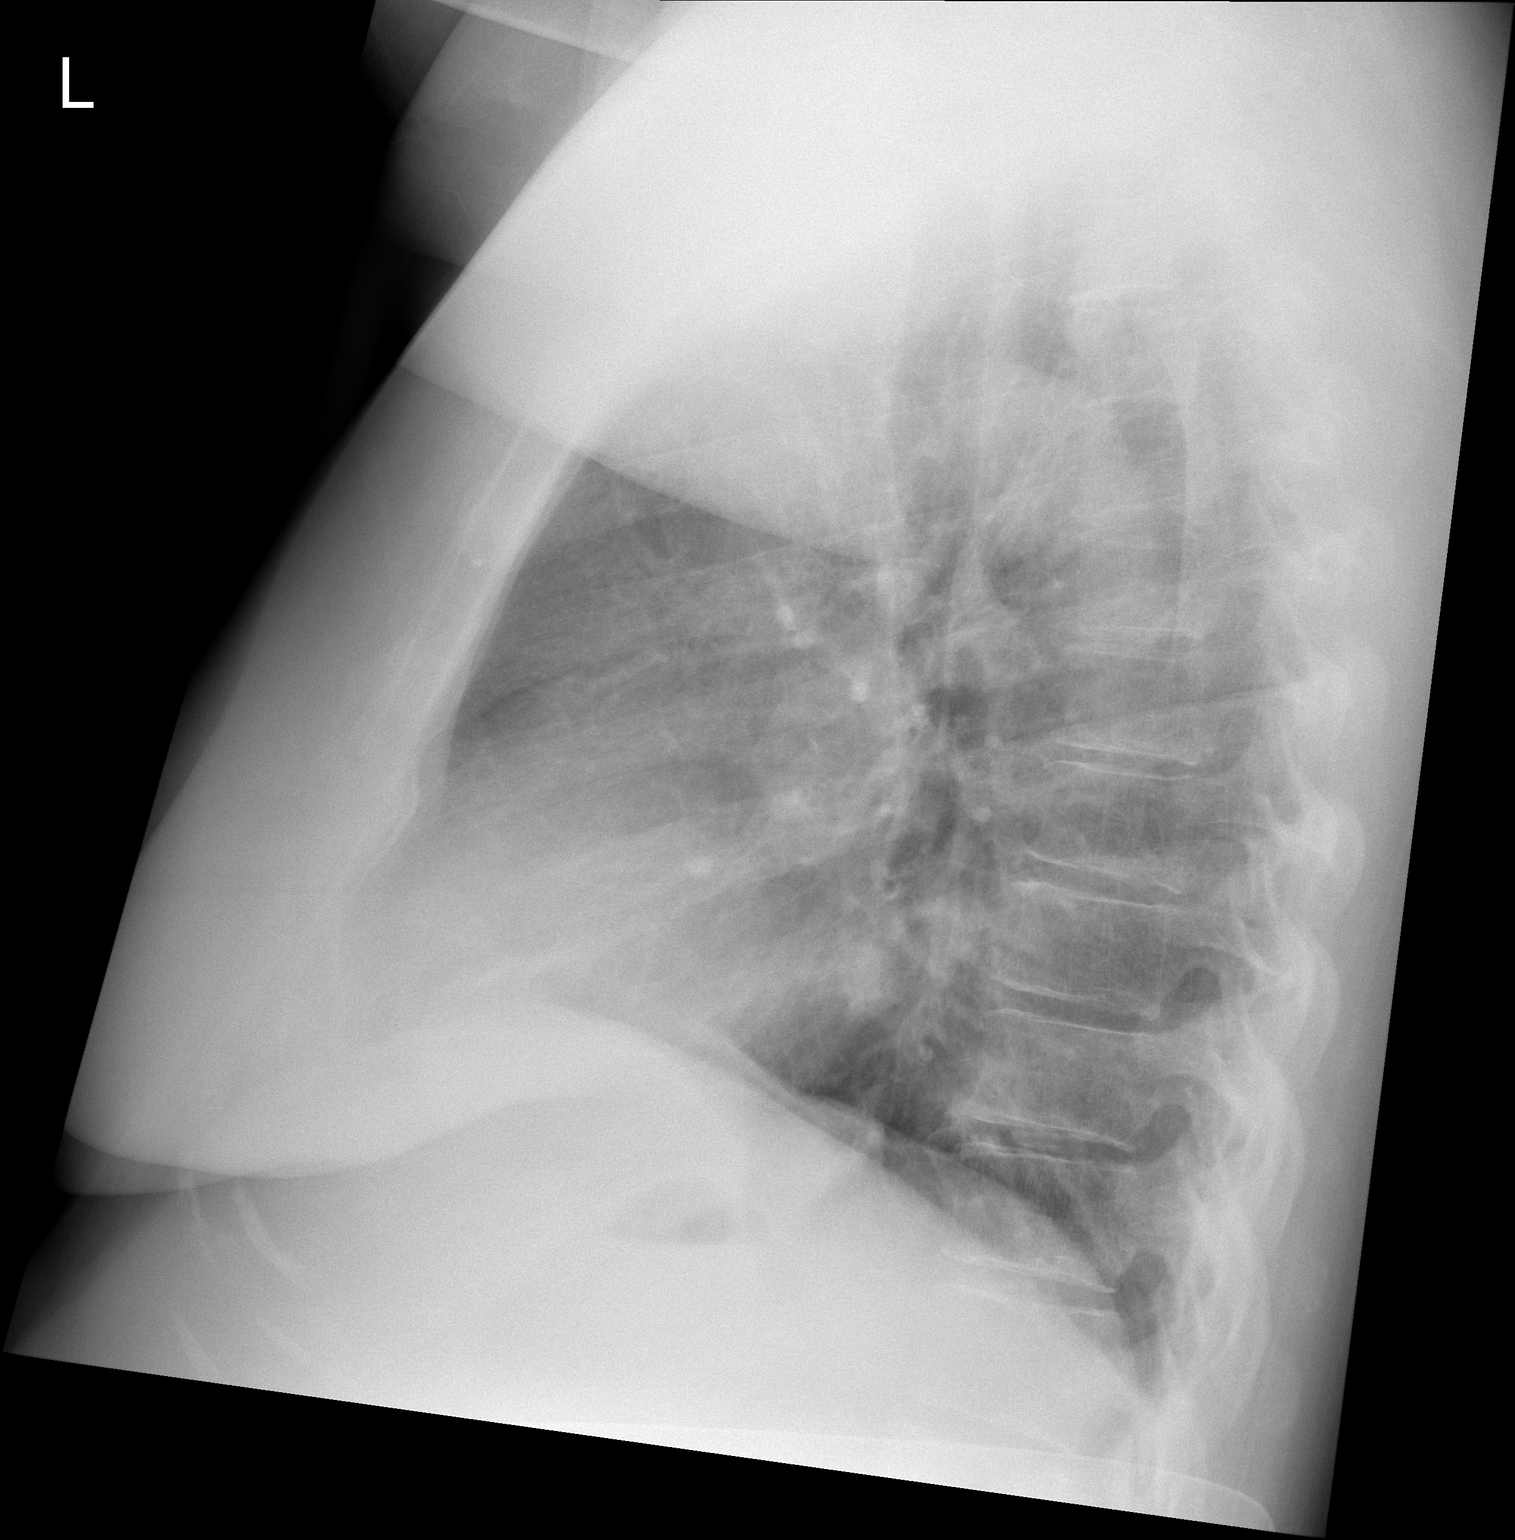

[2 of 2 positions shown; findings below may reference images not displayed]

FINDINGS: Heart is mildly enlarged and there is vascular congestion which is more prominent than on the prior study.  There is some mild interstitial edema.  There is no effusion.
IMPRESSION: Progression of mild interstitial edema since the prior study.

## 2008-07-09 ENCOUNTER — Ambulatory Visit (HOSPITAL_COMMUNITY): Admission: RE | Admit: 2008-07-09 | Discharge: 2008-07-09 | Payer: Self-pay | Admitting: Family Medicine

## 2008-07-09 ENCOUNTER — Ambulatory Visit: Payer: Self-pay | Admitting: Family Medicine

## 2008-07-12 ENCOUNTER — Encounter (INDEPENDENT_AMBULATORY_CARE_PROVIDER_SITE_OTHER): Payer: Self-pay | Admitting: Family Medicine

## 2008-08-07 ENCOUNTER — Telehealth (INDEPENDENT_AMBULATORY_CARE_PROVIDER_SITE_OTHER): Payer: Self-pay | Admitting: Family Medicine

## 2008-08-09 ENCOUNTER — Telehealth (INDEPENDENT_AMBULATORY_CARE_PROVIDER_SITE_OTHER): Payer: Self-pay | Admitting: Family Medicine

## 2008-08-09 ENCOUNTER — Ambulatory Visit: Payer: Self-pay | Admitting: Family Medicine

## 2008-08-12 ENCOUNTER — Encounter: Admission: RE | Admit: 2008-08-12 | Discharge: 2008-08-12 | Payer: Self-pay | Admitting: Family Medicine

## 2008-08-13 ENCOUNTER — Encounter (INDEPENDENT_AMBULATORY_CARE_PROVIDER_SITE_OTHER): Payer: Self-pay | Admitting: Family Medicine

## 2008-08-15 ENCOUNTER — Encounter: Payer: Self-pay | Admitting: *Deleted

## 2008-10-10 ENCOUNTER — Telehealth: Payer: Self-pay | Admitting: *Deleted

## 2008-11-08 ENCOUNTER — Encounter (INDEPENDENT_AMBULATORY_CARE_PROVIDER_SITE_OTHER): Payer: Self-pay | Admitting: Family Medicine

## 2008-12-02 ENCOUNTER — Telehealth: Payer: Self-pay | Admitting: Family Medicine

## 2008-12-03 ENCOUNTER — Ambulatory Visit: Payer: Self-pay | Admitting: Family Medicine

## 2008-12-03 DIAGNOSIS — M542 Cervicalgia: Secondary | ICD-10-CM | POA: Insufficient documentation

## 2008-12-03 LAB — CONVERTED CEMR LAB
Glucose, Urine, Semiquant: NEGATIVE
Nitrite: NEGATIVE
Specific Gravity, Urine: <1.005
WBC Urine, dipstick: NEGATIVE
pH: 6

## 2008-12-04 ENCOUNTER — Encounter: Payer: Self-pay | Admitting: Family Medicine

## 2008-12-10 ENCOUNTER — Encounter (INDEPENDENT_AMBULATORY_CARE_PROVIDER_SITE_OTHER): Payer: Self-pay | Admitting: Family Medicine

## 2008-12-12 ENCOUNTER — Ambulatory Visit: Payer: Self-pay | Admitting: Family Medicine

## 2008-12-19 ENCOUNTER — Encounter: Payer: Self-pay | Admitting: Family Medicine

## 2008-12-23 ENCOUNTER — Telehealth: Payer: Self-pay | Admitting: Family Medicine

## 2009-01-16 ENCOUNTER — Ambulatory Visit: Payer: Self-pay | Admitting: Family Medicine

## 2009-01-16 LAB — CONVERTED CEMR LAB
Hgb A1c MFr Bld: 6.8 %
Rapid Strep: NEGATIVE

## 2009-01-27 ENCOUNTER — Ambulatory Visit: Payer: Self-pay | Admitting: Family Medicine

## 2009-02-03 ENCOUNTER — Ambulatory Visit: Payer: Self-pay | Admitting: Family Medicine

## 2009-02-03 ENCOUNTER — Encounter: Payer: Self-pay | Admitting: Family Medicine

## 2009-02-04 ENCOUNTER — Ambulatory Visit: Payer: Self-pay | Admitting: Family Medicine

## 2009-02-04 ENCOUNTER — Encounter: Payer: Self-pay | Admitting: Family Medicine

## 2009-02-04 LAB — CONVERTED CEMR LAB
Alkaline Phosphatase: 66 units/L (ref 39–117)
Bilirubin, Direct: 0.1 mg/dL (ref 0.0–0.3)
CO2: 21 meq/L (ref 19–32)
Cholesterol: 134 mg/dL (ref 0–200)
Eosinophils Relative: 0 % (ref 0–5)
HDL: 46 mg/dL (ref >39)
Indirect Bilirubin: 0.2 mg/dL (ref 0.0–0.9)
Lymphocytes Relative: 16 % (ref 12–46)
Lymphs Abs: 1.6 10*3/uL (ref 0.7–4.0)
Neutro Abs: 7.8 10*3/uL — ABNORMAL HIGH (ref 1.7–7.7)
Neutrophils Relative %: 76 % (ref 43–77)
Phosphorus: 3.4 mg/dL (ref 2.3–4.6)
Potassium: 4.6 meq/L (ref 3.5–5.3)
RBC: 3.57 M/uL — ABNORMAL LOW (ref 3.87–5.11)
Sodium: 141 meq/L (ref 135–145)
Total Protein: 6.8 g/dL (ref 6.0–8.3)
WBC: 10.2 10*3/uL (ref 4.0–10.5)

## 2009-02-06 ENCOUNTER — Encounter: Payer: Self-pay | Admitting: Family Medicine

## 2009-02-06 LAB — CONVERTED CEMR LAB
Basophils Absolute: 0 10*3/uL
Basophils Relative: 0.1 %
HCT: 29.7 %
Hemoglobin: 10.2 g/dL
Lymphs Abs: 1.8 10*3/uL
Monocytes Absolute: 0.7 10*3/uL
Platelets: 230 10*3/uL
WBC: 9.1 10*3/uL

## 2009-02-14 ENCOUNTER — Telehealth: Payer: Self-pay | Admitting: Family Medicine

## 2009-02-14 ENCOUNTER — Ambulatory Visit: Payer: Self-pay | Admitting: Family Medicine

## 2009-02-18 ENCOUNTER — Encounter: Payer: Self-pay | Admitting: Family Medicine

## 2009-02-20 ENCOUNTER — Encounter: Payer: Self-pay | Admitting: Family Medicine

## 2009-03-05 ENCOUNTER — Encounter: Admission: RE | Admit: 2009-03-05 | Discharge: 2009-03-05 | Payer: Self-pay | Admitting: Family Medicine

## 2009-03-10 ENCOUNTER — Encounter: Payer: Self-pay | Admitting: Family Medicine

## 2009-03-11 ENCOUNTER — Encounter: Payer: Self-pay | Admitting: Family Medicine

## 2009-05-28 ENCOUNTER — Encounter: Payer: Self-pay | Admitting: Family Medicine

## 2009-05-28 LAB — CONVERTED CEMR LAB
Eosinophils Relative: 0.9 %
HCT: 30.2 %
Lymphocytes Relative: 15.4 %
Monocytes Relative: 5.4 %
Neutrophils Relative %: 78.1 %
Platelets: 231 10*3/uL
WBC: 14.5 10*3/uL

## 2009-05-29 ENCOUNTER — Encounter: Admission: RE | Admit: 2009-05-29 | Discharge: 2009-05-29 | Payer: Self-pay | Admitting: Family Medicine

## 2009-05-29 ENCOUNTER — Ambulatory Visit: Payer: Self-pay | Admitting: Family Medicine

## 2009-05-29 ENCOUNTER — Encounter: Payer: Self-pay | Admitting: Family Medicine

## 2009-06-02 ENCOUNTER — Encounter: Payer: Self-pay | Admitting: Family Medicine

## 2009-07-22 ENCOUNTER — Encounter: Payer: Self-pay | Admitting: Family Medicine

## 2009-08-11 IMAGING — CR DG HAND COMPLETE 3+V*R*
3 series · 3 of 3 positions shown · non-contrast
Comparison: none

CLINICAL DATA: Pain and swelling.
 RIGHT HAND ? 3 VIEW:

[view not recorded (1 of 3)]
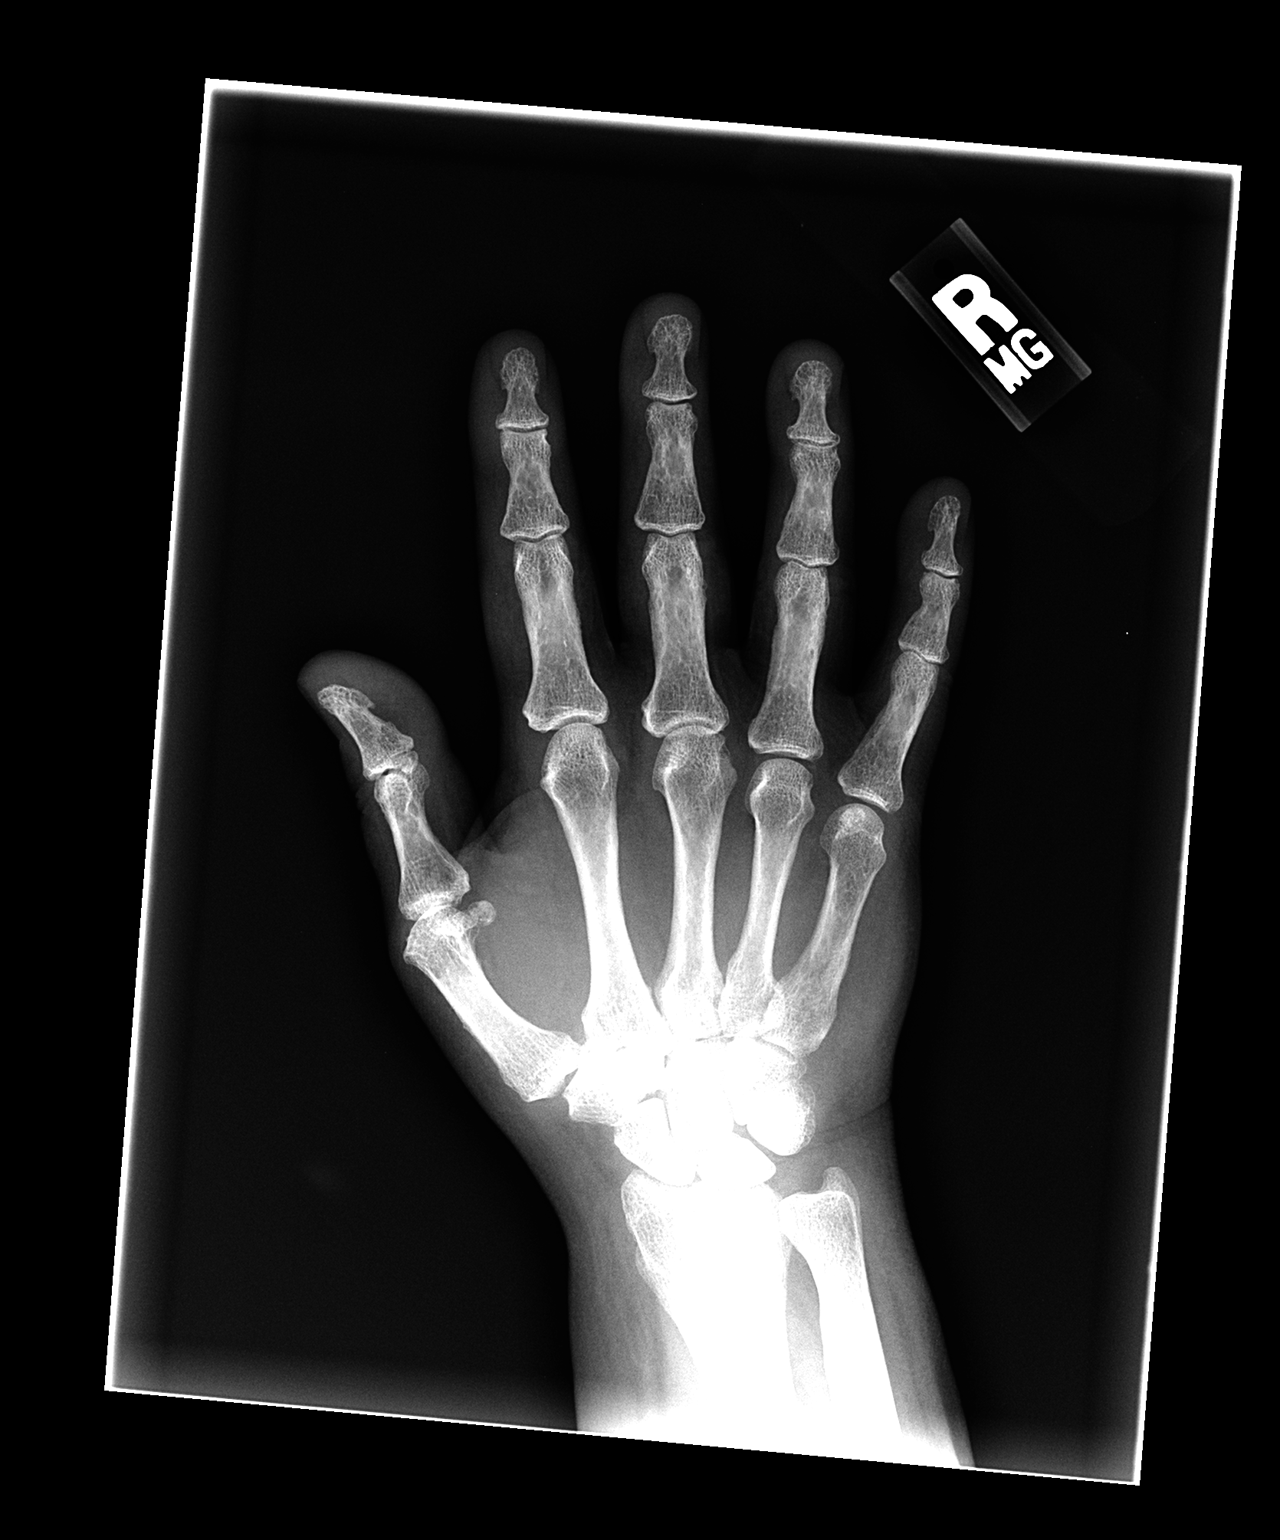

[view not recorded (2 of 3)]
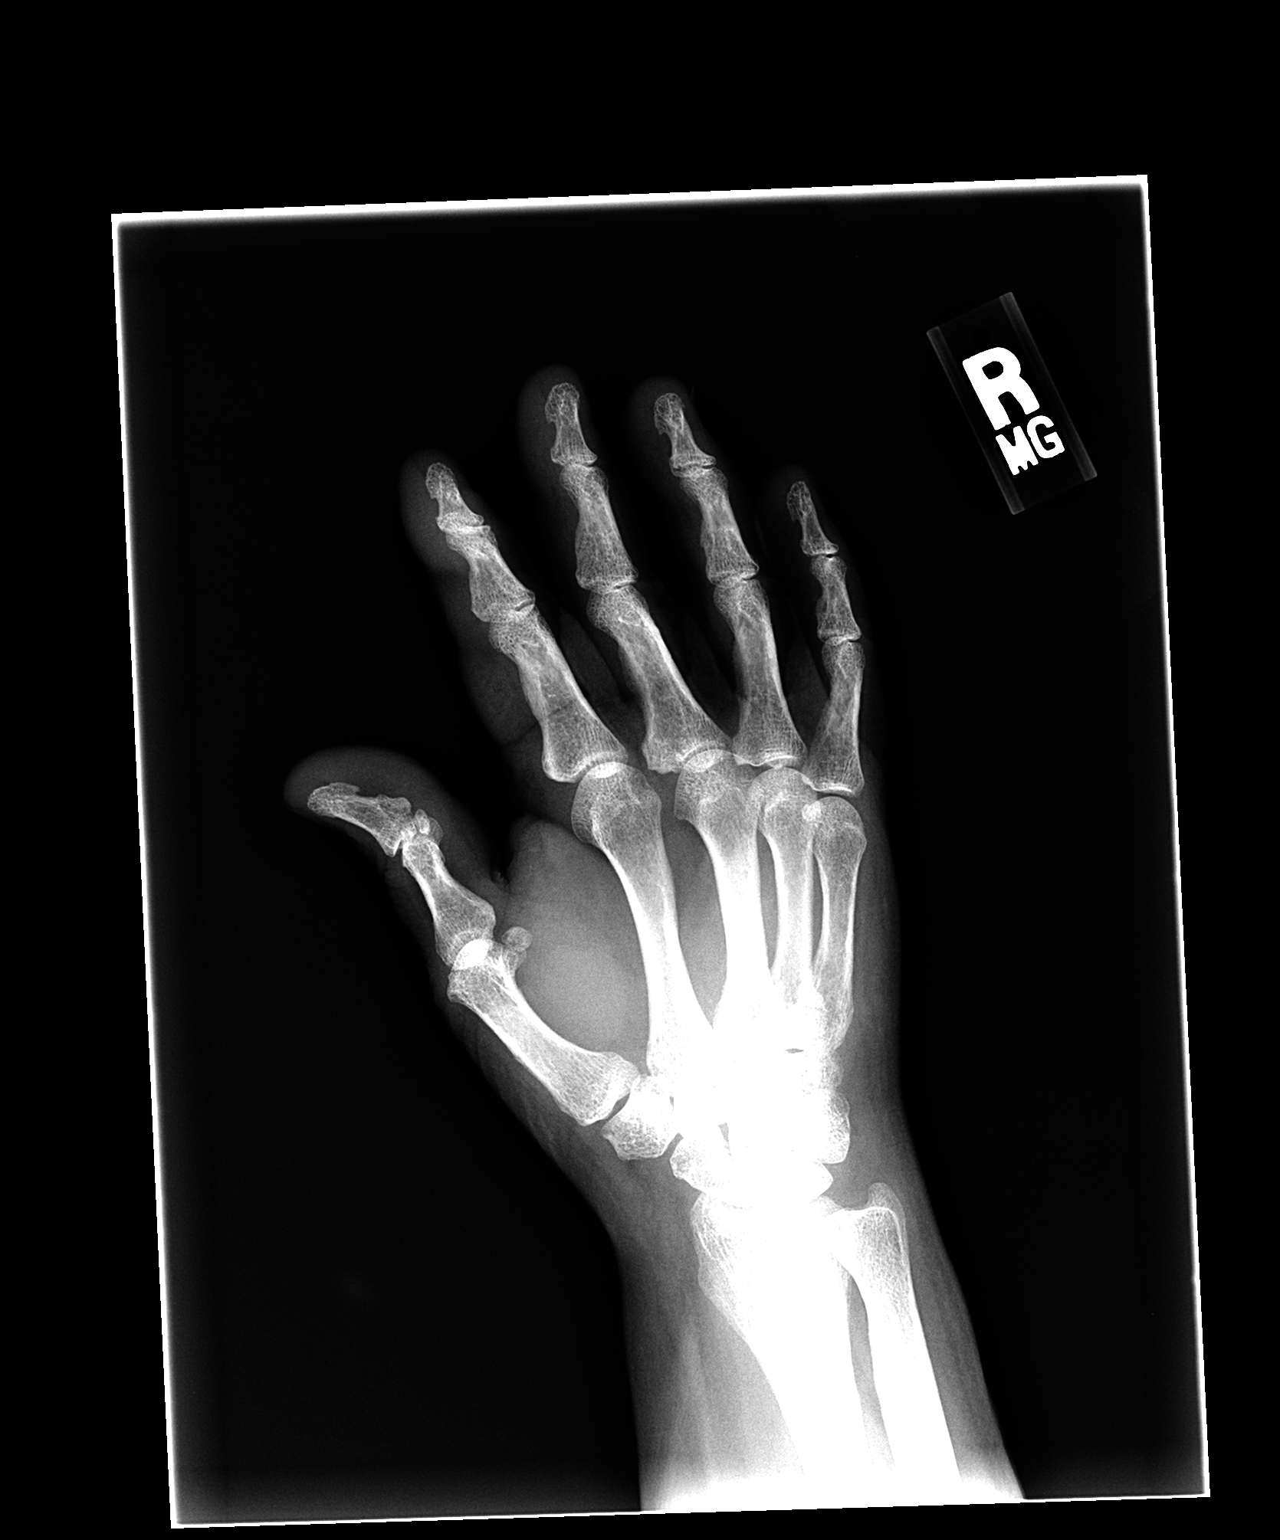

[view not recorded (3 of 3)]
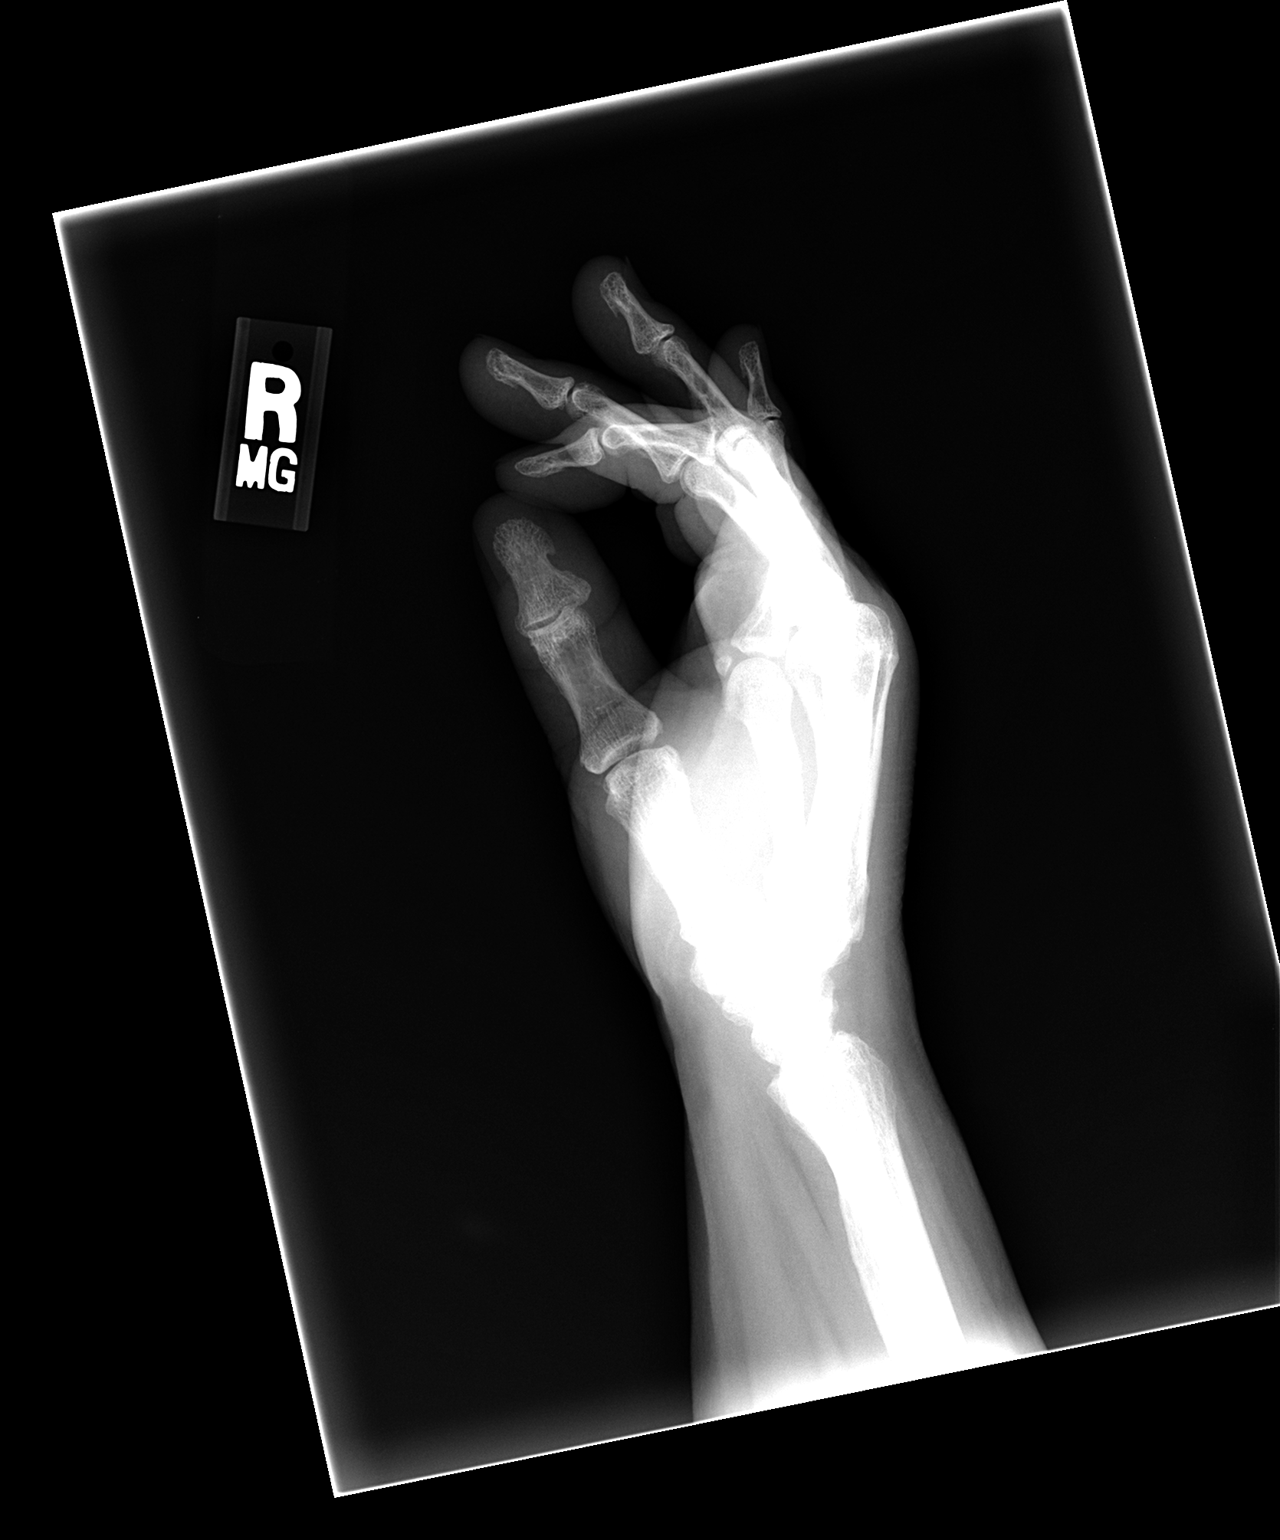

[3 of 3 positions shown; findings below may reference images not displayed]

FINDINGS: The bones are mildly osteopenic.  There is no fracture or dislocation identified.  There is diffuse edema involving the soft tissues surrounding the metacarpal region.  No suspicious osseous erosions or abnormal soft tissue calcifications identified.
IMPRESSION: 1.  Soft tissue swelling.
 2.  No acute fractures or dislocations.

## 2009-08-31 ENCOUNTER — Emergency Department (HOSPITAL_COMMUNITY): Admission: EM | Admit: 2009-08-31 | Discharge: 2009-08-31 | Payer: Self-pay | Admitting: Family Medicine

## 2009-09-01 ENCOUNTER — Ambulatory Visit: Payer: Self-pay | Admitting: Family Medicine

## 2009-09-01 ENCOUNTER — Inpatient Hospital Stay (HOSPITAL_COMMUNITY): Admission: EM | Admit: 2009-09-01 | Discharge: 2009-09-08 | Payer: Self-pay | Admitting: Emergency Medicine

## 2009-09-01 ENCOUNTER — Encounter: Payer: Self-pay | Admitting: Family Medicine

## 2009-09-07 ENCOUNTER — Ambulatory Visit: Payer: Self-pay | Admitting: Psychiatry

## 2009-09-08 LAB — CONVERTED CEMR LAB
CO2: 24 meq/L
Calcium: 9.5 mg/dL
HCT: 27.1 %
Hemoglobin: 9.4 g/dL
Hgb A1c MFr Bld: 6.9 %
Sodium: 138 meq/L
WBC: 8.7 10*3/uL

## 2009-09-15 ENCOUNTER — Ambulatory Visit: Payer: Self-pay | Admitting: Family Medicine

## 2009-09-16 ENCOUNTER — Telehealth: Payer: Self-pay | Admitting: Family Medicine

## 2009-09-18 ENCOUNTER — Telehealth: Payer: Self-pay | Admitting: *Deleted

## 2009-09-30 ENCOUNTER — Encounter: Payer: Self-pay | Admitting: Family Medicine

## 2009-10-02 ENCOUNTER — Ambulatory Visit: Payer: Self-pay | Admitting: Family Medicine

## 2009-10-24 ENCOUNTER — Encounter: Payer: Self-pay | Admitting: Family Medicine

## 2009-10-29 ENCOUNTER — Encounter: Payer: Self-pay | Admitting: Family Medicine

## 2009-11-04 ENCOUNTER — Ambulatory Visit: Payer: Self-pay | Admitting: Family Medicine

## 2009-11-04 LAB — CONVERTED CEMR LAB: Hemoglobin: 11.1 g/dL

## 2009-11-06 ENCOUNTER — Encounter: Payer: Self-pay | Admitting: *Deleted

## 2009-11-21 ENCOUNTER — Encounter: Payer: Self-pay | Admitting: Family Medicine

## 2009-11-21 LAB — CONVERTED CEMR LAB
Ferritin: 198 ng/mL
HCT: 28.5 %
Iron: 50 ug/dL
Saturation Ratios: 14.1 %
TIBC: 354 ug/dL
UIBC: 304 ug/dL
Vitamin B-12: 658 pg/mL

## 2009-12-16 ENCOUNTER — Encounter: Payer: Self-pay | Admitting: Family Medicine

## 2010-01-27 ENCOUNTER — Ambulatory Visit: Payer: Self-pay | Admitting: Family Medicine

## 2010-01-27 DIAGNOSIS — T07XXXA Unspecified multiple injuries, initial encounter: Secondary | ICD-10-CM

## 2010-02-04 ENCOUNTER — Encounter: Payer: Self-pay | Admitting: Family Medicine

## 2010-02-05 ENCOUNTER — Ambulatory Visit: Payer: Self-pay | Admitting: Family Medicine

## 2010-02-12 ENCOUNTER — Encounter: Payer: Self-pay | Admitting: Family Medicine

## 2010-02-12 ENCOUNTER — Inpatient Hospital Stay (HOSPITAL_COMMUNITY): Admission: EM | Admit: 2010-02-12 | Discharge: 2010-02-16 | Payer: Self-pay | Admitting: Emergency Medicine

## 2010-02-12 ENCOUNTER — Ambulatory Visit: Payer: Self-pay | Admitting: Family Medicine

## 2010-02-20 ENCOUNTER — Ambulatory Visit: Payer: Self-pay | Admitting: Family Medicine

## 2010-03-31 ENCOUNTER — Encounter: Payer: Self-pay | Admitting: Family Medicine

## 2010-03-31 ENCOUNTER — Ambulatory Visit: Payer: Self-pay | Admitting: Family Medicine

## 2010-04-01 ENCOUNTER — Telehealth: Payer: Self-pay | Admitting: *Deleted

## 2010-04-01 ENCOUNTER — Encounter: Payer: Self-pay | Admitting: Family Medicine

## 2010-04-01 LAB — CONVERTED CEMR LAB
AST: 14 units/L (ref 0–37)
Albumin: 4.5 g/dL (ref 3.5–5.2)
Alkaline Phosphatase: 58 units/L (ref 39–117)
BUN: 43 mg/dL — ABNORMAL HIGH (ref 6–23)
Creatinine, Ser: 2.08 mg/dL — ABNORMAL HIGH (ref 0.40–1.20)
Glucose, Bld: 163 mg/dL — ABNORMAL HIGH (ref 70–99)
HCT: 30.6 % — ABNORMAL LOW (ref 36.0–46.0)
HDL: 38 mg/dL — ABNORMAL LOW (ref >39)
Hemoglobin: 10.1 g/dL — ABNORMAL LOW (ref 12.0–15.0)
INR: 0.97 (ref <1.50)
LDL Cholesterol: 58 mg/dL (ref 0–99)
MCHC: 33 g/dL (ref 30.0–36.0)
Potassium: 5.4 meq/L — ABNORMAL HIGH (ref 3.5–5.3)
RBC: 3.52 M/uL — ABNORMAL LOW (ref 3.87–5.11)
Total CHOL/HDL Ratio: 3.4
Triglycerides: 177 mg/dL — ABNORMAL HIGH (ref <150)
aPTT: 44 s — ABNORMAL HIGH (ref 24–37)

## 2010-04-09 ENCOUNTER — Ambulatory Visit: Payer: Self-pay | Admitting: Family Medicine

## 2010-04-09 ENCOUNTER — Encounter: Payer: Self-pay | Admitting: Family Medicine

## 2010-04-09 DIAGNOSIS — R252 Cramp and spasm: Secondary | ICD-10-CM | POA: Insufficient documentation

## 2010-04-09 LAB — CONVERTED CEMR LAB
BUN: 31 mg/dL — ABNORMAL HIGH (ref 6–23)
Chloride: 104 meq/L (ref 96–112)
Creatinine, Ser: 1.9 mg/dL — ABNORMAL HIGH (ref 0.40–1.20)
Glucose, Bld: 126 mg/dL — ABNORMAL HIGH (ref 70–99)
Potassium: 5 meq/L (ref 3.5–5.3)

## 2010-04-12 ENCOUNTER — Encounter: Payer: Self-pay | Admitting: Family Medicine

## 2010-04-25 ENCOUNTER — Encounter: Payer: Self-pay | Admitting: Family Medicine

## 2010-04-28 ENCOUNTER — Encounter: Payer: Self-pay | Admitting: Family Medicine

## 2010-05-05 ENCOUNTER — Encounter: Admission: RE | Admit: 2010-05-05 | Discharge: 2010-05-05 | Payer: Self-pay | Admitting: Family Medicine

## 2010-05-18 ENCOUNTER — Encounter: Payer: Self-pay | Admitting: Family Medicine

## 2010-05-25 ENCOUNTER — Encounter: Payer: Self-pay | Admitting: Family Medicine

## 2010-05-25 LAB — CONVERTED CEMR LAB
Albumin: 4.2 g/dL
CO2: 26 meq/L
Calcium: 9.9 mg/dL
Chloride: 105 meq/L
Hemoglobin: 10.2 g/dL
MCV: 85.9 fL
Sodium: 140 meq/L
WBC: 6.5 10*3/uL

## 2010-06-16 ENCOUNTER — Encounter: Payer: Self-pay | Admitting: Family Medicine

## 2010-06-24 ENCOUNTER — Ambulatory Visit: Admit: 2010-06-24 | Payer: Self-pay

## 2010-06-30 ENCOUNTER — Emergency Department (HOSPITAL_COMMUNITY)
Admission: EM | Admit: 2010-06-30 | Discharge: 2010-06-30 | Payer: Self-pay | Source: Home / Self Care | Admitting: Family Medicine

## 2010-07-01 ENCOUNTER — Ambulatory Visit: Admit: 2010-07-01 | Payer: Self-pay

## 2010-07-05 ENCOUNTER — Encounter: Payer: Self-pay | Admitting: Family Medicine

## 2010-07-09 ENCOUNTER — Ambulatory Visit: Admission: RE | Admit: 2010-07-09 | Discharge: 2010-07-09 | Payer: Self-pay | Source: Home / Self Care

## 2010-07-09 ENCOUNTER — Encounter: Payer: Self-pay | Admitting: Family Medicine

## 2010-07-09 LAB — GLUCOSE, CAPILLARY: Glucose-Capillary: 155 mg/dL — ABNORMAL HIGH (ref 70–99)

## 2010-07-13 IMAGING — US US RENAL
1 series · 14 of 25 positions shown · non-contrast
Comparison: 07/25/2006

CLINICAL DATA: Chronic renal insufficiency.

RENAL/URINARY TRACT ULTRASOUND
TECHNIQUE: Complete ultrasound examination of the urinary tract
was performed including evaluation of the kidneys, renal collecting
systems, and urinary bladder.

[Series 1: us renal · 0.28mm/px · 14 of 42 slices shown]
[im 1/42]
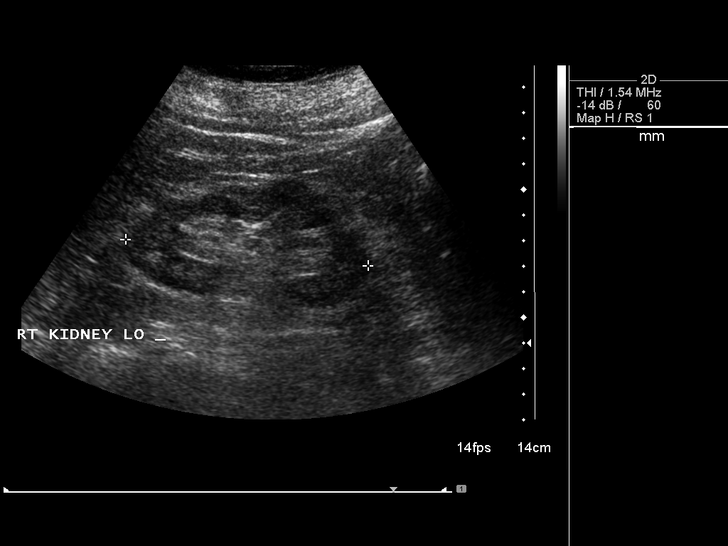
[im 4/42]
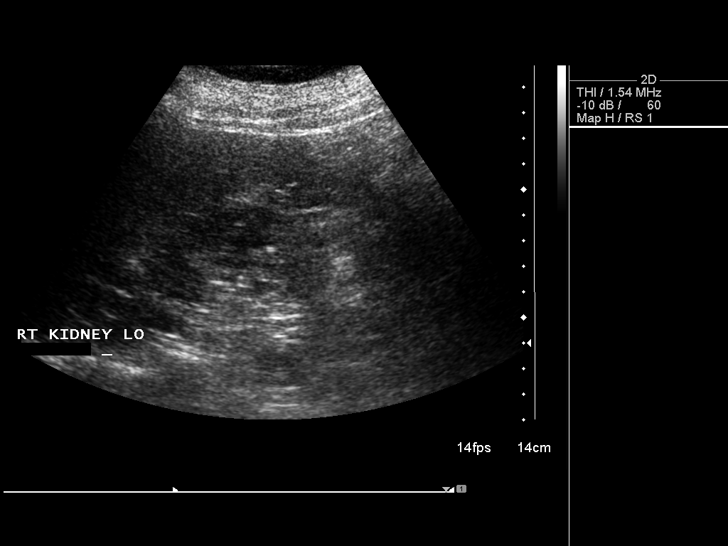
[im 7/42]
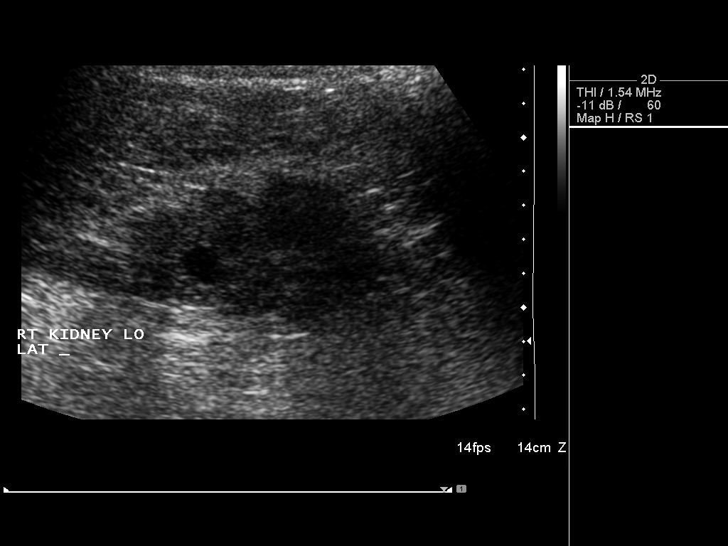
[im 11/42]
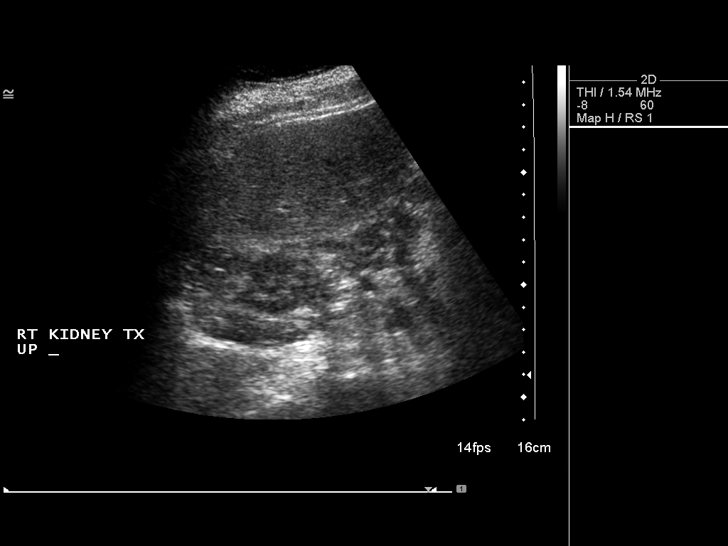
[im 14/42]
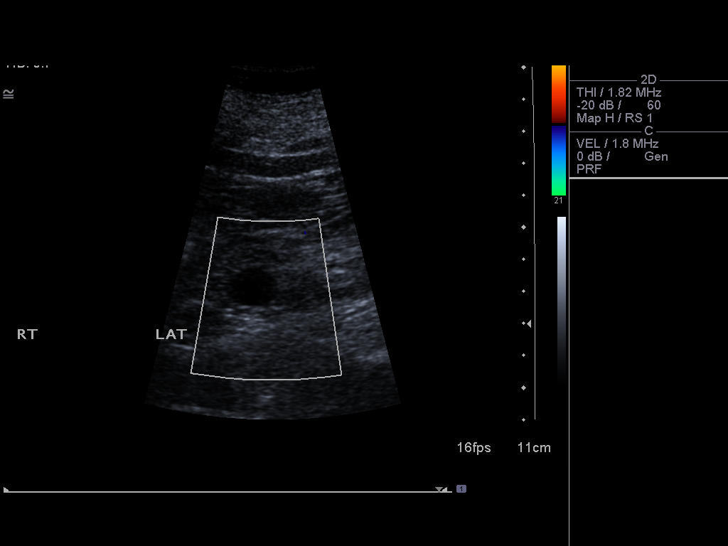
[im 16/42]
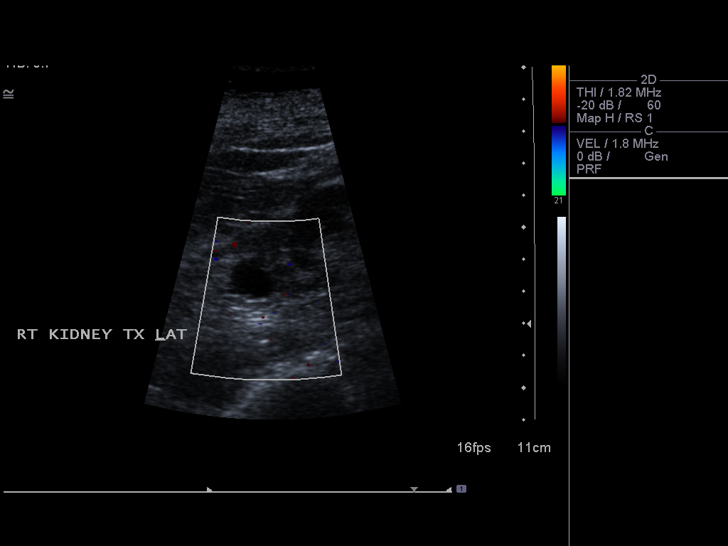
[im 19/42]
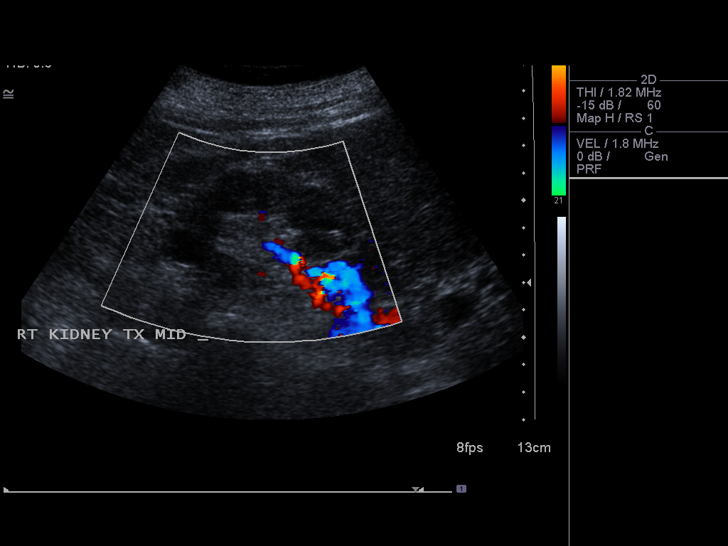
[im 23/42]
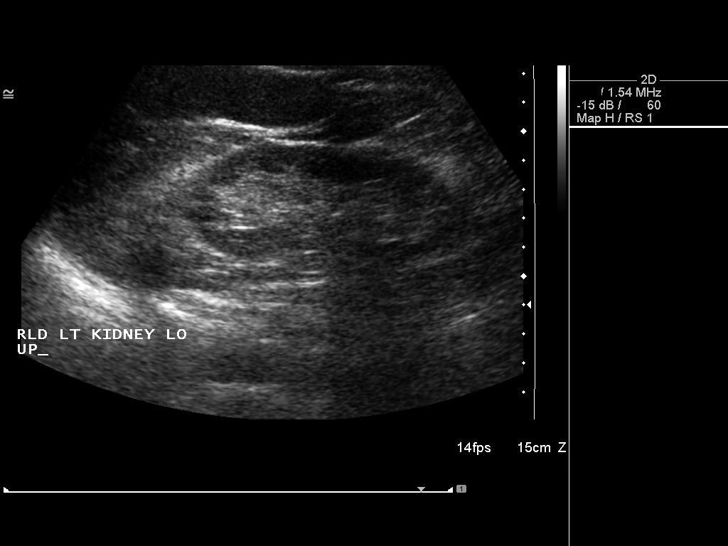
[im 26/42]
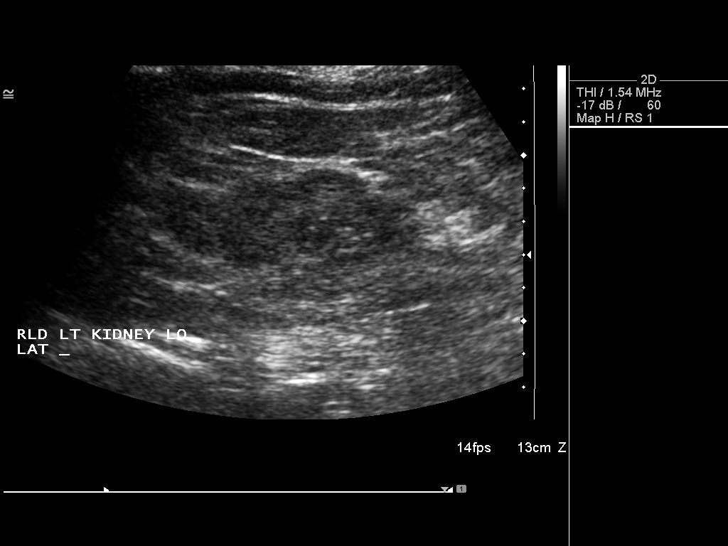
[im 28/42]
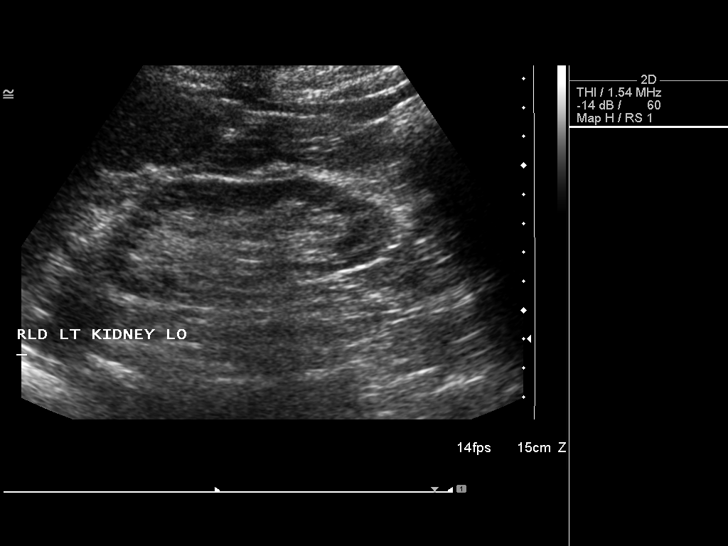
[im 31/42]
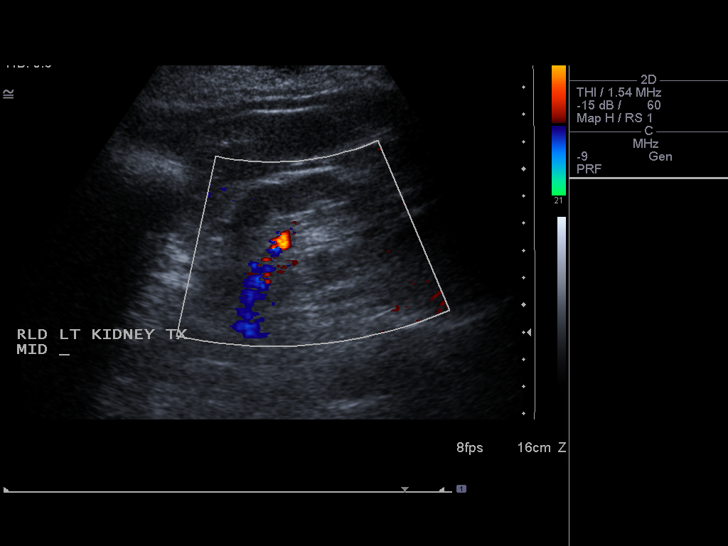
[im 35/42]
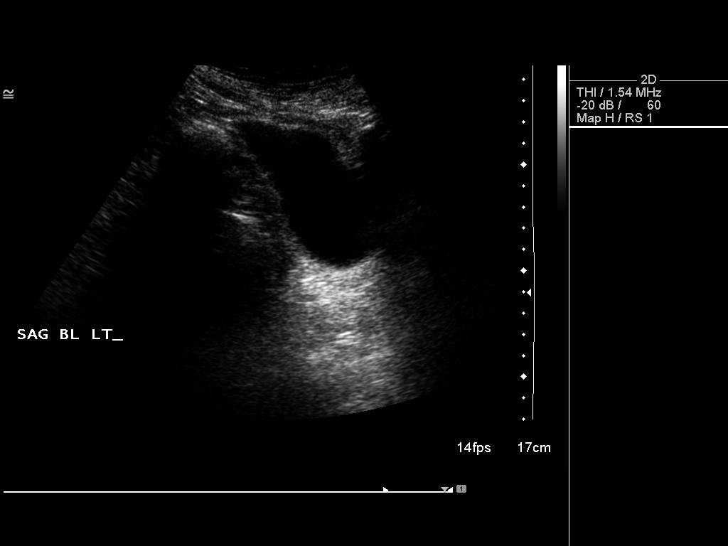
[im 38/42]
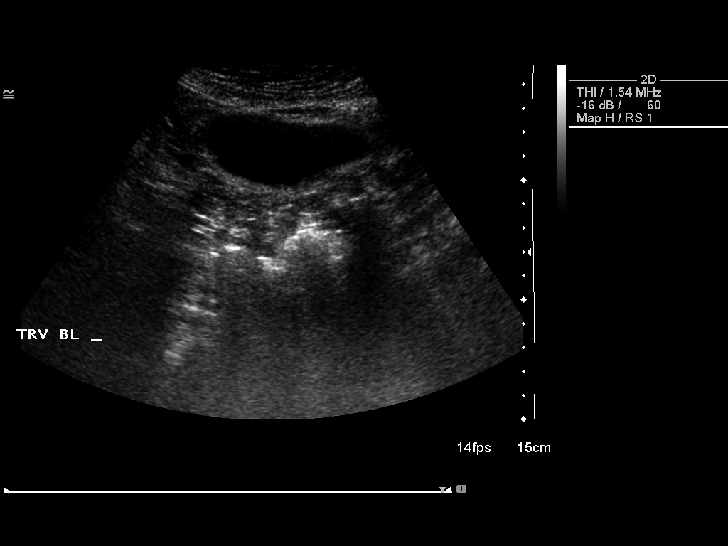
[im 42/42]
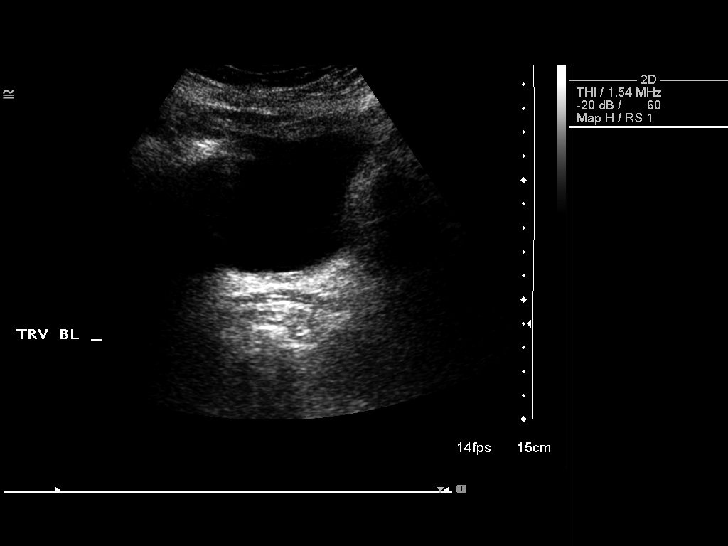

[14 of 25 positions shown; findings below may reference images not displayed]

FINDINGS: Right kidney measures 9.6 cm and left kidney, 10.2 cm.  A
1.7 x 1.0 x 1.4 cm anechoic lesion with increased through
transmission is seen in the right kidney.  Renal parenchymal echo
texture is otherwise uniform bilaterally, although there is slight
cortical thinning.  Bladder is unremarkable.
IMPRESSION: Bilateral renal cortical thinning and a right renal cyst.

## 2010-07-14 ENCOUNTER — Encounter: Payer: Self-pay | Admitting: Family Medicine

## 2010-07-14 LAB — CONVERTED CEMR LAB
HCT: 31.4 %
Hemoglobin: 10.2 g/dL
MCV: 86.3 fL
RDW: 13.3 %
WBC: 20 10*3/uL

## 2010-07-14 NOTE — Assessment & Plan Note (Signed)
Summary: nut,df   Vital Signs:  Patient profile:   70 year old female Height:      64 inches Weight:      185.0 pounds BMI:     31.87  Vitals Entered By: Wyona Almas PHD (February 05, 2010 11:34 AM)  Primary Care Provider:  Delbert Harness MD   History of Present Illness: Assessment:  Spent 30 mintes with pt.  No estimated kcal intake from 24-hr recall b/c patient could not remember what she hadl: B (AM)- none (usually 1/2 c oatmeal w/ Splenda & 2 oz sk milk, coffee w/ 2-3 Splenda & 2 oz milk); L (PM)- 1/2  c noodles, 1 c lima beans w/ kethcup, bun, tea; Snk- ?fruit?; D (PM)- parmesan chicken, sweet potato w/ 1 tsp margarine.  Rebecca [name]'s daughter, Rebecca [name], said she thinks her mom's weight gain is mostly related to excessive portion sizes and to late-night snacking.  Rebecca [name] said she did not remember the dietary recommendations provided at her last appt, although her daughter said she actually had made some good choices initially, and had lost weight that she's now regained.  She also had started walking some, but has quit with the hot weather.  She said she can walk the halls at the retirement home on hot days, but just has not done so.  FBG was 158 today.  She did not bring her monitor with her today, and does not remember usual numbers.      Nutrition Diagnosis:  Difficult to evaluate progress on excessive mineral intake (sodium) (NI-55.2) related to delivered mid-day meal at retirement community due to poor memory of usual or 24-hr intake.  No progress on physical inactivity (NB-2.1) related to lack of motivation as evidenced by no reported activity.    Intervention: See Patient Instructions.    Monitoring/Eval:  Dietary intake, body weight, and exercise at 2-wk F/U.     Allergies: No Known Drug Allergies   Complete Medication List: 1)  Aspirin Adult Low Strength 81 Mg Tbec (Aspirin) 2)  Plavix 75 Mg Tabs (Clopidogrel bisulfate) 3)  Nitroglycerin 0.4 Mg Subl (Nitroglycerin)  .... As needed 4)  Isosorbide Mononitrate Cr 60 Mg Tb24 (Isosorbide mononitrate) .... One tablet daily 5)  Toprol Xl 100 Mg Tb24 (Metoprolol succinate) .... Take one tablet daily 6)  Cardizem Cd 240 Mg Cp24 (Diltiazem hcl coated beads) 7)  Furosemide 20 Mg Tabs (Furosemide) .... Take 1 tablet by mouth once a day 8)  Pravastatin Sodium 40 Mg Tabs (Pravastatin sodium) .Marland Kitchen.. 1 tablet by mouth at bedtime for cholesterol - to replace simvastatin 9)  Clozapine 100 Mg Tabs (Clozapine) .... 3 tablets by mouth daily 10)  Synthroid 50 Mcg Tabs (Levothyroxine sodium) .... Take 1 tablet by mouth once a day 11)  Fish Oil 1000 Mg Caps (Omega-3 fatty acids) .... 2 capsules daily (otc) 12)  Omeprazole 20 Mg Cpdr (Omeprazole) .... Take one tablet daily for reflux 13)  Ventolin Hfa 108 (90 Base) Mcg/act Aers (Albuterol sulfate) .Marland Kitchen.. 1-2 puffs inh q4 hrs as needed for shortness of breath 14)  Tylenol 8 Hour 650 Mg Cr-tabs (Acetaminophen) .Marland Kitchen.. 1 tab by mouth q 8 hours as needed pain; use least effective amount 15)  Calcium 600/vitamin D 600-400 Mg-unit Tabs (Calcium carbonate-vitamin d) 16)  Hydroxyzine Hcl 25 Mg Tabs (Hydroxyzine hcl) .Marland Kitchen.. 1 tablet by mouth two times a day as needed for itching 17)  Diabetic Shoes  .... Dx:  diabetes wtih exostosis s/p great toe fracture 18)  Triamcinolone Acetonide 0.5 % Oint (Triamcinolone acetonide) .... Apply to affected areas two times a day for no more than 2 weeks - disp 60 grams 19)  Lidoderm 5 % Ptch (Lidocaine) .... Apply up to 3 patches at a time.  use for 12 hours; then remove for 12 hours. 20)  Miralax Powd (Polyethylene glycol 3350) .Marland KitchenMarland KitchenMarland Kitchen 17 g dissolved in 8 ozs fluid daily as needed constipation.  Other Orders: Reassessment Each 15 min unit- [facility] ([id number])  Patient Instructions: 1)  Increase vegetables!  Choose some vegetables at both lunch and dinner.  Emphasize the non-starchy vegetables such as green beans, broccoli, leafy greens, cauliflower, tomatoes & salad  foods.  2)  Limit your intake of starchy foods to no more than 2 servings per meal.  Examples include bread, potatoes, corn, cereal, noodles, and rice.  One serving = 1/2 cup or 1 slice of bread.    3)  Limit evening snacking.  Try to choose fruit for snacks.   4)  Walk more!!  Goal:  Walk at least 6 days a week, at least 10 minutes.  5)  Limit sodium intake to 1500 mg/day.  Remember that most of the sodium you eat is already in the food!  Read labels, and choose carefully.   6)  Please schedule a nutrition appointment for 2 weeks.

## 2010-07-14 NOTE — Miscellaneous (Signed)
  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

## 2010-07-14 NOTE — Consult Note (Signed)
Summary: [facility]   Imported By: Clydell Hakim 10/09/2009 15:34:43  _____________________________________________________________________  External Attachment:    Type:   Image     Comment:   External Document

## 2010-07-14 NOTE — Assessment & Plan Note (Signed)
Summary: f/u,df   Vital Signs:  Patient profile:   70 year old female Weight:      187.2 pounds Temp:     98.8 degrees F oral Pulse rate:   82 / minute Pulse rhythm:   regular BP sitting:   121 / 65  (right arm) Cuff size:   large  Vitals Entered By: Loralee Pacas CMA (January 27, 2010 1:33 PM)  Primary Care Provider:  Delbert Harness MD   History of Present Illness: 70 yo here for follow-up  here with her daughter  DIABETES Meds: diet controlled Visual problems: no Monitoring feet: no Numbness/Tingling no: Last A1c: <6  HYPERTENSION Meds: Taking and tolerating? cardizem and toprol, yes Home BP's: no Chest Pain: no Dyspnea: no  Obesity:  feels like her eating was under better control after visit with Dr. Gerilyn Pilgrim.  Has instituted bran ceral +/- flax.    Constipation:  continues to be constipated despite adding bran and taking colace daily.     Current Medications (verified): 1)  Aspirin Adult Low Strength 81 Mg Tbec (Aspirin) 2)  Plavix 75 Mg Tabs (Clopidogrel Bisulfate) 3)  Nitroglycerin 0.4 Mg Subl (Nitroglycerin) .... As Needed 4)  Isosorbide Mononitrate Cr 60 Mg  Tb24 (Isosorbide Mononitrate) .... One Tablet Daily 5)  Toprol Xl 100 Mg Tb24 (Metoprolol Succinate) .... Take One Tablet Daily 6)  Cardizem Cd 240 Mg Cp24 (Diltiazem Hcl Coated Beads) 7)  Furosemide 20 Mg Tabs (Furosemide) .... Take 1 Tablet By Mouth Once A Day 8)  Pravastatin Sodium 40 Mg Tabs (Pravastatin Sodium) .Marland Kitchen.. 1 Tablet By Mouth At Bedtime For Cholesterol - To Replace Simvastatin 9)  Clozapine 100 Mg Tabs (Clozapine) .... 3 Tablets By Mouth Daily 10)  Synthroid 50 Mcg Tabs (Levothyroxine Sodium) .... Take 1 Tablet By Mouth Once A Day 11)  Fish Oil 1000 Mg  Caps (Omega-3 Fatty Acids) .... 2 Capsules Daily (Otc) 12)  Omeprazole 20 Mg  Cpdr (Omeprazole) .... Take One Tablet Daily For Reflux 13)  Ventolin Hfa 108 (90 Base) Mcg/act  Aers (Albuterol Sulfate) .Marland Kitchen.. 1-2 Puffs Inh Q4 Hrs As Needed For  Shortness of Breath 14)  Tylenol 8 Hour 650 Mg Cr-Tabs (Acetaminophen) .Marland Kitchen.. 1 Tab By Mouth Q 8 Hours As Needed Pain; Use Least Effective Amount 15)  Calcium 600/vitamin D 600-400 Mg-Unit  Tabs (Calcium Carbonate-Vitamin D) 16)  Hydroxyzine Hcl 25 Mg Tabs (Hydroxyzine Hcl) .Marland Kitchen.. 1 Tablet By Mouth Two Times A Day As Needed For Itching 17)  Diabetic Shoes .... Dx:  Diabetes Wtih Exostosis S/p Great Toe Fracture 18)  H. Zoster Vaccine .... Please Administer X 1 19)  Triamcinolone Acetonide 0.5 % Oint (Triamcinolone Acetonide) .... Apply To Affected Areas Two Times A Day For No More Than 2 Weeks - Disp 60 Grams 20)  Triderm 0.1 % Crea (Triamcinolone Acetonide) .... 60 Grams Compounded in 240 Ml of Cetaphil Lotion - Apply Once or Twice Daily and After Bathing 21)  Lidoderm 5 % Ptch (Lidocaine) .... Apply Up To 3 Patches At A Time.  Use For 12 Hours; Then Remove For 12 Hours. 22)  Miralax  Powd (Polyethylene Glycol 3350) .Marland KitchenMarland KitchenMarland Kitchen 17 G Dissolved in 8 Ozs Fluid Daily As Needed Constipation. 23)  Zostavax 16109 Unt/0.64ml Solr (Zoster Vaccine Live) .... Administer As Single Dose  Allergies (verified): No Known Drug Allergies PMH-FH-SH reviewed-no changes except otherwise noted  Review of Systems      See HPI  Physical Exam  General:  AxO.  present today with daughter.  In good spirits. Lungs:  normal respiratory effort, normal breath sounds, no crackles, and no wheezes.   Heart:  Normal rate and regular rhythm. S1 and S2 normal without gallop, murmur, click, rub or other extra sounds. Abdomen:  soft, non-tender, and normal bowel sounds.   Msk:  R arm TTP over proximal, anterior humerus.  No swelling, redness, or warmth.  No bruising.  Decreased ROM in her right shoulder but that is unchanged from baseline. Skin:  green/yellow hue to bilateral cheeks and a one 1 cm  old bruise on each of forearms.   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  At goal.  Her updated medication list for this problem includes:    Aspirin Adult Low Strength 81 Mg Tbec (Aspirin)  Orders: A1C-[facility] (30160) [facility]- Est  Level 4 (99214)Future Orders: Comp Met-[facility] ([id number]) ... 01/15/2011  Labs Reviewed: Creat: 1.82 (09/08/2009)   Microalbumin: 1+ (02/03/2009)  Last Eye Exam: normal (02/09/2008) Reviewed HgBA1c results: 6.5 (01/27/2010)  6.9 (09/08/2009)  Problem # 2:  HYPERTENSION, BENIGN SYSTEMIC (ICD-401.1)  At goal   Her updated medication list for this problem includes:    Toprol Xl 100 Mg Tb24 (Metoprolol succinate) .Marland Kitchen... Take one tablet daily    Cardizem Cd 240 Mg Cp24 (Diltiazem hcl coated beads)    Furosemide 20 Mg Tabs (Furosemide) .Marland Kitchen... Take 1 tablet by mouth once a day  BP today: 121/65 Prior BP: 116/69 (11/04/2009)  Prior 10 Yr Risk Heart Disease: N/A (07/05/2007)  Labs Reviewed: K+: 4.8 (09/08/2009) Creat: : 1.82 (09/08/2009)   Chol: 134 (02/03/2009)   [name]: 46 (02/03/2009)   LDL: 59 (02/03/2009)   TG: 145 (02/03/2009)  Orders: [facility]- Est  Level 4 (99214)  Problem # 3:  HYPERCHOLESTEROLEMIA (ICD-272.0) will recheck- due for yearly lipids.  Her updated medication list for this problem includes:    Pravastatin Sodium 40 Mg Tabs (Pravastatin sodium) .Marland Kitchen... 1 tablet by mouth at bedtime for cholesterol - to replace simvastatin  Future Orders: Lipid-[facility] ([id number]) ... 01/22/2011  Labs Reviewed: SGOT: 14 (02/03/2009)   SGPT: 17 (02/03/2009)  Lipid Goals: Chol Goal: 200 (07/05/2007)   [name] Goal: 40 (07/05/2007)   LDL Goal: 70 (07/05/2007)   TG Goal: 150 (07/05/2007)  Prior 10 Yr Risk Heart Disease: N/A (07/05/2007)   [name]:46 (02/03/2009), 41 (03/18/2008)  LDL:59 (02/03/2009), 58 (03/18/2008)  Chol:134 (02/03/2009), 132 (07/12/2007)  [name]:145 (02/03/2009), 194 (03/18/2008)  Problem # 4:  CONTUSION OF MULTIPLE SITES NEC (ICD-924.8) inadvertently seen on exam.  No history of trauma.  No bleeding some nose, elsewhere.   Not enough to warrant workup.  Will check CBC- hx of chronic anemia and PT/PTT.  Patient on both plavix and aspirin.  may be able to discontinue ASA- has hx of drug-eluting stent.  Orders: [facility]- Est  Level 4 (99214)Future Orders: PTT-[facility] ([id number]) ... 01/16/2011 INR/PT-[facility] ([id number]) ... 01/14/2011  Problem # 5:  RENAL INSUFFICIENCY, CHRONIC (ICD-586)  Will check labs and forward to nephrology- patient has appointment next week.  Future Orders: Phosphorus-[facility] [id number]) ... 01/15/2011  Labs Reviewed: BUN: 32 (09/08/2009)   Cr: 1.82 (09/08/2009)    Hgb: 9.9 (11/21/2009)   Hct: 28.5 (11/21/2009)   Ca++: 9.5 (09/08/2009)   Phos: 3.4 (02/03/2009) TP: 6.8 (02/03/2009)   Alb: 4.5 (02/03/2009)  Problem # 6:  CONSTIPATION, CHRONIC (ICD-564.09)  advised adding fiber, miralax.  Chornic problem.  If continues to be problematic, will consider further evaluation. Her updated medication list for this problem  includes:    Miralax Powd (Polyethylene glycol 3350) .Marland KitchenMarland KitchenMarland KitchenMarland Kitchen 17 g dissolved in 8 ozs fluid daily as needed constipation.  Orders: [facility]- Est  Level 4 (46962)  Complete Medication List: 1)  Aspirin Adult Low Strength 81 Mg Tbec (Aspirin) 2)  Plavix 75 Mg Tabs (Clopidogrel bisulfate) 3)  Nitroglycerin 0.4 Mg Subl (Nitroglycerin) .... As needed 4)  Isosorbide Mononitrate Cr 60 Mg Tb24 (Isosorbide mononitrate) .... One tablet daily 5)  Toprol Xl 100 Mg Tb24 (Metoprolol succinate) .... Take one tablet daily 6)  Cardizem Cd 240 Mg Cp24 (Diltiazem hcl coated beads) 7)  Furosemide 20 Mg Tabs (Furosemide) .... Take 1 tablet by mouth once a day 8)  Pravastatin Sodium 40 Mg Tabs (Pravastatin sodium) .Marland Kitchen.. 1 tablet by mouth at bedtime for cholesterol - to replace simvastatin 9)  Clozapine 100 Mg Tabs (Clozapine) .... 3 tablets by mouth daily 10)  Synthroid 50 Mcg Tabs (Levothyroxine sodium) .... Take 1 tablet by mouth once a day 11)  Fish Oil 1000 Mg Caps (Omega-3 fatty acids) .... 2 capsules daily  (otc) 12)  Omeprazole 20 Mg Cpdr (Omeprazole) .... Take one tablet daily for reflux 13)  Ventolin Hfa 108 (90 Base) Mcg/act Aers (Albuterol sulfate) .Marland Kitchen.. 1-2 puffs inh q4 hrs as needed for shortness of breath 14)  Tylenol 8 Hour 650 Mg Cr-tabs (Acetaminophen) .Marland Kitchen.. 1 tab by mouth q 8 hours as needed pain; use least effective amount 15)  Calcium 600/vitamin D 600-400 Mg-unit Tabs (Calcium carbonate-vitamin d) 16)  Hydroxyzine Hcl 25 Mg Tabs (Hydroxyzine hcl) .Marland Kitchen.. 1 tablet by mouth two times a day as needed for itching 17)  Diabetic Shoes  .... Dx:  diabetes wtih exostosis s/p great toe fracture 18)  Triamcinolone Acetonide 0.5 % Oint (Triamcinolone acetonide) .... Apply to affected areas two times a day for no more than 2 weeks - disp 60 grams 19)  Lidoderm 5 % Ptch (Lidocaine) .... Apply up to 3 patches at a time.  use for 12 hours; then remove for 12 hours. 20)  Miralax Powd (Polyethylene glycol 3350) .Marland KitchenMarland KitchenMarland Kitchen 17 g dissolved in 8 ozs fluid daily as needed constipation.  Other Orders: Future Orders: CBC-[facility] ([id number]) ... 01/21/2011 TSH-[facility] ([id number]) ... 01/22/2011  Patient Instructions: 1)  Make appt for fastign bloodwork- will fwd to Dr. Kathrene Bongo 2)  Follow-up in 3 months or sooner 3)  Use miralax, fiber supplement (metamucil or benefiber) for constipation.  Prevention & Chronic Care Immunizations   Influenza vaccine: Fluvax MCR  (03/06/2008)   Influenza vaccine due: 03/06/2009    Tetanus booster: 12/13/1998: Done.   Tetanus booster due: 12/12/2008    Pneumococcal vaccine: Done.  (03/14/2000)   Pneumococcal vaccine due: None    H. zoster vaccine: 03/21/2008: Prescription given  Colorectal Screening   Hemoccult: Done.  (12/17/2004)   Hemoccult due: Not Indicated    Colonoscopy: Done.  (11/17/2004)   Colonoscopy due: 11/18/2014  Other Screening   Pap smear: Not documented   Pap smear due: Not Indicated    Mammogram: normal  (03/05/2009)   Mammogram action/deferral:  Screening mammogram in 1 year.     (09/29/2006)   Mammogram due: 03/05/2010    DXA bone density scan: Done.  (05/14/2006)   DXA scan due: None    Smoking status: never  (11/04/2009)  Diabetes Mellitus   HgbA1C: 6.5  (01/27/2010)   Hemoglobin A1C due: 09/25/2008    Eye exam: normal  (02/09/2008)   Eye exam due: 02/08/2009    Foot exam: yes  (01/27/2009)  High risk foot: Not documented   Foot care education: completed  (03/21/2008)   Foot exam due: 03/21/2009    Urine microalbumin/creatinine ratio: Not documented   Urine microalbumin/cr due: 06/27/2009    Diabetes flowsheet reviewed?: Yes   Progress toward A1C goal: At goal  Lipids   Total Cholesterol: 134  (02/03/2009)   LDL: 59  (02/03/2009)   LDL Direct: 88  (09/05/2007)   [name]: 46  (02/03/2009)   Triglycerides: 145  (02/03/2009)    SGOT (AST): 14  (02/03/2009)   SGPT (ALT): 17  (02/03/2009) CMP ordered    Alkaline phosphatase: 66  (02/03/2009)   Total bilirubin: 0.3  (02/03/2009)  Hypertension   Last Blood Pressure: 121 / 65  (01/27/2010)   Serum creatinine: 1.82  (09/08/2009)   Serum potassium 4.8  (09/08/2009) CMP ordered     Hypertension flowsheet reviewed?: Yes   Progress toward BP goal: At goal  Self-Management Support :   Personal Goals (by the next clinic visit) :     Personal A1C goal: 7  (01/27/2010)     Personal blood pressure goal: 130/80  (01/27/2010)     Personal LDL goal: 70  (01/27/2010)    Diabetes self-management support: Not documented    Hypertension self-management support: Not documented    Hypertension self-management support not done because: Good outcomes  (01/27/2010)    Lipid self-management support: Not documented   Laboratory Results   Blood Tests   Date/Time Received: January 27, 2010 1:37 PM  Date/Time Reported: January 27, 2010 1:46 PM   HGBA1C: 6.5%   (Normal Range: Non-Diabetic - 3-6%   Control Diabetic - 6-8%)  Comments: ...............test performed  by......Marland KitchenBonnie A. [place], MLS (ASCP)cm

## 2010-07-14 NOTE — Initial Assessments (Signed)
Summary: Oliguria  [facility] ADMISSION HISTORY AND PHYSICAL ** PLACE IN PROGRESS NOTES **  PCP:  Delbert Harness, MD, at [facility]  CC:  Oliguria  HPI:  70 yo female with PMH significant for CRI who complains of inability to urinate since the approximately noon on 03/19.  She originally presented to the [facility] where she was able to produce a 50 mL urine specimen with some difficulty.  A Foley has been placed, and she has passed  ~100 mL urine into the bag.  She also complains of abdominal distension with LLQ and RLQ abdominal discomfort.  Her bowel movements have been irregular the past few days, with some diarrhea on Friday, and no stooling on Saturday or Sunday.  She did have a sensation of weakness in her arms and legs yesterday, though she has been able to move them normally.  She has had some lower back pain the past week as well.  She denies dysuria, hematuria, chest pain, dyspnea, fevers.   ROS:  Per HPI.  ALL:  NKDA  MED: ASA 81 mg po daily Plavix 75 mg po daily NTG 0.4 mg subl prn Isosorbide Mononitrate Cr 60 mg po daily Toprol XL 100 mg po daily Cardizem CD 240 mg po daily Furosemide 20 mg po daily Pravastatin 40 mg po qhs Clozapine 200 mg po daily Synthroid 50 mcg po daily Fish Oil 2000 mg po daily Pantoprazole 40 mg po daily Tylenol CR 650 mg po q8h prn pain Ca 600 / Vit D 400 supplement Hydroxyzine 25 mg po bid prn itching Miralax 17 grams dissolved in 8 oz fluid po daily prn constipation Docusate 100 mg po daily prn constipation  PMH: Renal Insufficiency, Chronic (baseline Cr 1.4-1.8) -- followed by Dr. Goldsborough DM, Type 2 -- diet controlled CAD, s/p RCA stent HTN Hypercholesterolemia Hypothyroidism Anemia, Chronic GERD Gout Arthritis Schizoaffective Disorder Constipation, Chronic Obesity  PSH: Hysterectomy Cholecystectomy  FH:  Daughter with stomach problems, father died  age 90 heart disease, mother died 70s heart failure and diabetes.  SH:  Lives alone in apartment in [place].  Daughter is very involved and accompanies her today.  No tobacco, EtOH, illicit drugs.  PE:  97.0   79   20   99/61   96% (RA) GEN: Alert & oriented, no acute distress NECK: Midline trachea, no masses/thyromegaly, no cervical lymphadenopathy CARDIO: Regular rate and rhythm, no murmurs/rubs/gallops, 2+ bilateral radial pulses RESP: Clear to auscultation, normal work of breathing, no retractions/accessory muscle use ABD: Hyperactive bowel sounds, distended, nontender, no masses/hepatosplenomegaly EXT: Nontender, no edema Neuro:  5/5 strength x5 extremities  LABS/RADIOLOGY:  Sodium (NA)                       131        l      135-145          mEq/L  Potassium (K)                     3.8               3.5-5.1          mEq/L  Chloride                          96   96-112           mEq/L  CO2                               22                19-32            mEq/L  Glucose                           191        h      70-99            mg/dL  BUN                               71         h      6-23             mg/dL  Creatinine                        5.01       h      0.4-1.2          mg/dL  Calcium                           8.4               8.4-10.5         mg/dL   WBC                               13.3       h      4.0-10.5         K/uL  Hemoglobin (HGB)                  10.6       l      12.0-15.0        g/dL  Hematocrit (HCT)                  31.3       l      36.0-46.0        %  Platelet Count (PLT)              217               150-400          K/uL   Occult Blood, Fecal               NEGATIVE   Color, Urine                      AMBER      a      YELLOW    BIOCHEMICALS MAY BE AFFECTED BY COLOR  Appearance                        CLOUDY     a      CLEAR  Specific Gravity                  1.020  1.005-1.030  pH                                5.0                5.0-8.0  Urine Glucose                     NEGATIVE          NEG              mg/dL  Bilirubin                         SMALL      a      NEG  Ketones                           15         a      NEG              mg/dL  Blood                             NEGATIVE          NEG  Protein                           NEGATIVE          NEG              mg/dL  Urobilinogen                      0.2               0.0-1.0          mg/dL  Nitrite                           NEGATIVE          NEG  Leukocytes                        SMALL      a      NEG  Squamous Epithelial / LPF         MANY       a      RARE  Casts / HPF                       SEE NOTE.  a      NEG    HYALINE CASTS  WBC / HPF                         3-6               <3               WBC/hpf  RBC / HPF                         0-2               <3  RBC/hpf  Bacteria / HPF                    FEW        a      RARE  Urine-Other                       SEE NOTE.    MUCOUS PRESENT  Acute Abdomen:  Dilatation of small bowel in excess of colon with scattered air fluid levels.  Findings are nonspecific in setting of diarrhea; early or partial small-bowel obstruction is not excluded.  No pneumoperitoneum.  No acute cardiopulmonary process.  A/P:  70 yo female with RENAL.  Oliguria and AonCRF.  Possibly secondary to DM, though her she is well controlled on diet alone with A1c 6.8.  Urine output is <200 mL in the past 24 hours, and current Cr is 5.0 with last known 1.7 (01/2009).  Will check FENa and renal ultrasound.  Would avoid NSAIDs, ACE-I, contrast dye at this time.  Hold home Furosemide.  Consult nephrology in the morning. FEN/GI.  Abdominal distension with possible early or partial SBO on acute abdomen.  CT of abdomen with oral contrast ordered in the ED and is pending at this time.  Heplock IVF as does not appear dehydrated.  NPO until CT completed. PSYCH.  Stable.  Continue home medications. CAD.  Stable.  Continue home  medications. PROPHYLAXIS.  Heparin. DISPOSITION.  Pending renal w/u.

## 2010-07-14 NOTE — Letter (Signed)
Summary: Results Follow-up Letter  [facility]  [address]   [place], [place] [address]   Phone: [phone]  Fax: [phone]    04/12/2010  [address] [place], [place]  [address]  Dear Ms. Rebecca [name],   The following are the results of your recent test(s):  your labwork showed your kidney function was at baseline for you and your potassium was at a good level.  I have also forwarded these records to your nephrologist.  Sincerely,  Delbert Harness MD [facility]           Appended Document: Results Follow-up Letter mailed

## 2010-07-14 NOTE — Assessment & Plan Note (Signed)
Summary: hfu SBO/ARF,df   Vital Signs:  Patient profile:   70 year old female Weight:      180.5 pounds Temp:     98.4 degrees F oral Pulse rate:   109 / minute Pulse rhythm:   regular BP sitting:   140 / 80  (right arm) Cuff size:   large  Vitals Entered By: Loralee Pacas CMA (February 20, 2010 11:25 AM) CC: hospital follow up Is Patient Diabetic? Yes   Primary Care Provider:  Delbert Harness MD  CC:  hospital follow up.  History of Present Illness: 23 here for hospital f/u  was in the hospital for partial SBO with ARF due to dehydration.  Was treated with NPO x 24 hours and resolved.  Her creatinine improved to baseline or better prior to discharge.  Changed were made to her BP regimen- amlodpine was lowered and she was discontinued on metoprolol and lasix until seen for followup  Patient states she feels almost back to baseline.  No abd pain, n/v/diarrhea.  Continued chronic consiptation.  Habits & Providers  Alcohol-Tobacco-Diet     Tobacco Status: never     Year Quit: 2006  Current Medications (verified): 1)  Aspirin Adult Low Strength 81 Mg Tbec (Aspirin) 2)  Plavix 75 Mg Tabs (Clopidogrel Bisulfate) 3)  Nitroglycerin 0.4 Mg Subl (Nitroglycerin) .... As Needed 4)  Isosorbide Mononitrate Cr 60 Mg  Tb24 (Isosorbide Mononitrate) .... One Tablet Daily 5)  Toprol Xl 50 Mg Xr24h-Tab (Metoprolol Succinate) .... Take One Tablet Daily 6)  Diltiazem Hcl Cr 180 Mg Xr24h-Cap (Diltiazem Hcl) .... Take One Tablet Daily 7)  Furosemide 20 Mg Tabs (Furosemide) .... Take 1 Tablet By Mouth Once A Day 8)  Pravastatin Sodium 40 Mg Tabs (Pravastatin Sodium) .Marland Kitchen.. 1 Tablet By Mouth At Bedtime For Cholesterol - To Replace Simvastatin 9)  Clozapine 100 Mg Tabs (Clozapine) .... 3 Tablets By Mouth Daily 10)  Synthroid 50 Mcg Tabs (Levothyroxine Sodium) .... Take 1 Tablet By Mouth Once A Day 11)  Fish Oil 1000 Mg  Caps (Omega-3 Fatty Acids) .... 2 Capsules Daily (Otc) 12)  Pantoprazole Sodium 40  Mg Tbec (Pantoprazole Sodium) .... Take One Tablet Daily 13)  Ventolin Hfa 108 (90 Base) Mcg/act  Aers (Albuterol Sulfate) .Marland Kitchen.. 1-2 Puffs Inh Q4 Hrs As Needed For Shortness of Breath 14)  Tylenol 8 Hour 650 Mg Cr-Tabs (Acetaminophen) .Marland Kitchen.. 1 Tab By Mouth Q 8 Hours As Needed Pain; Use Least Effective Amount 15)  Calcium 600/vitamin D 600-400 Mg-Unit  Tabs (Calcium Carbonate-Vitamin D) 16)  Hydroxyzine Hcl 25 Mg Tabs (Hydroxyzine Hcl) .Marland Kitchen.. 1 Tablet By Mouth Two Times A Day As Needed For Itching 17)  Diabetic Shoes .... Dx:  Diabetes Wtih Exostosis S/p Great Toe Fracture 18)  Triamcinolone Acetonide 0.5 % Oint (Triamcinolone Acetonide) .... Apply To Affected Areas Two Times A Day For No More Than 2 Weeks - Disp 60 Grams 19)  Lidoderm 5 % Ptch (Lidocaine) .... Apply Up To 3 Patches At A Time.  Use For 12 Hours; Then Remove For 12 Hours. 20)  Miralax  Powd (Polyethylene Glycol 3350) .Marland KitchenMarland KitchenMarland Kitchen 17 G Dissolved in 8 Ozs Fluid Daily As Needed Constipation.  Allergies: No Known Drug Allergies PMH-FH-SH reviewed-no changes except otherwise noted  Review of Systems      See HPI  Physical Exam  General:  well appearing, NAD. Lungs:  Normal respiratory effort, chest expands symmetrically. Lungs are clear to auscultation, no crackles or wheezes. Heart:  Normal rate and  regular rhythm. S1 and S2 normal without gallop, murmur, click, rub or other extra sounds. Abdomen:  Bowel sounds positive,abdomen soft and non-tender without masses, organomegaly or hernias noted. Extremities:  no LE edema   Impression & Recommendations:  Problem # 1:  RENAL INSUFFICIENCY, CHRONIC (ICD-586)  Acute on chronic, now resolved.  Follow up as needed.  Just saw nephrology in past few weeks.  Orders: [facility]- Est  Level 4 (16109)  Problem # 2:  CONSTIPATION, CHRONIC (ICD-564.09)  Not likely etiology of small bowel obsturction.  patient has a long history of both constipation and intermittant hospitalizations for SBO.  Continue  current management.  No colonoscopy in > 10 years.  Patient had a bad experience with the previous one.  If constipation worsens, may benefit from colonoscopy to r/o maligancy.  Her updated medication list for this problem includes:    Miralax Powd (Polyethylene glycol 3350) .Marland KitchenMarland KitchenMarland KitchenMarland Kitchen 17 g dissolved in 8 ozs fluid daily as needed constipation.  Orders: [facility]- Est  Level 4 (60454)  Problem # 3:  HYPERTENSION, BENIGN SYSTEMIC (ICD-401.1)  Wll discontinue furosemide.  No LE edema but discussed if returns, to elevate legs, compression hose.  Will restart toprol at half dose as preious given she is above goal today and tachycardic.  Her updated medication list for this problem includes:    Toprol Xl 50 Mg Xr24h-tab (Metoprolol succinate) .Marland Kitchen... Take one tablet daily    Diltiazem Hcl Cr 180 Mg Xr24h-cap (Diltiazem hcl) .Marland Kitchen... Take one tablet daily    Furosemide 20 Mg Tabs (Furosemide) .Marland Kitchen... Take 1 tablet by mouth once a day  Orders: [facility]- Est  Level 4 (09811)  Complete Medication List: 1)  Aspirin Adult Low Strength 81 Mg Tbec (Aspirin) 2)  Plavix 75 Mg Tabs (Clopidogrel bisulfate) 3)  Nitroglycerin 0.4 Mg Subl (Nitroglycerin) .... As needed 4)  Isosorbide Mononitrate Cr 60 Mg Tb24 (Isosorbide mononitrate) .... One tablet daily 5)  Toprol Xl 50 Mg Xr24h-tab (Metoprolol succinate) .... Take one tablet daily 6)  Diltiazem Hcl Cr 180 Mg Xr24h-cap (Diltiazem hcl) .... Take one tablet daily 7)  Furosemide 20 Mg Tabs (Furosemide) .... Take 1 tablet by mouth once a day 8)  Pravastatin Sodium 40 Mg Tabs (Pravastatin sodium) .Marland Kitchen.. 1 tablet by mouth at bedtime for cholesterol - to replace simvastatin 9)  Clozapine 100 Mg Tabs (Clozapine) .... 3 tablets by mouth daily 10)  Synthroid 50 Mcg Tabs (Levothyroxine sodium) .... Take 1 tablet by mouth once a day 11)  Fish Oil 1000 Mg Caps (Omega-3 fatty acids) .... 2 capsules daily (otc) 12)  Pantoprazole Sodium 40 Mg Tbec (Pantoprazole sodium) .... Take one tablet  daily 13)  Ventolin Hfa 108 (90 Base) Mcg/act Aers (Albuterol sulfate) .Marland Kitchen.. 1-2 puffs inh q4 hrs as needed for shortness of breath 14)  Tylenol 8 Hour 650 Mg Cr-tabs (Acetaminophen) .Marland Kitchen.. 1 tab by mouth q 8 hours as needed pain; use least effective amount 15)  Calcium 600/vitamin D 600-400 Mg-unit Tabs (Calcium carbonate-vitamin d) 16)  Hydroxyzine Hcl 25 Mg Tabs (Hydroxyzine hcl) .Marland Kitchen.. 1 tablet by mouth two times a day as needed for itching 17)  Diabetic Shoes  .... Dx:  diabetes wtih exostosis s/p great toe fracture 18)  Triamcinolone Acetonide 0.5 % Oint (Triamcinolone acetonide) .... Apply to affected areas two times a day for no more than 2 weeks - disp 60 grams 19)  Lidoderm 5 % Ptch (Lidocaine) .... Apply up to 3 patches at a time.  use for 12 hours;  then remove for 12 hours. 20)  Miralax Powd (Polyethylene glycol 3350) .Marland KitchenMarland KitchenMarland Kitchen 17 g dissolved in 8 ozs fluid daily as needed constipation.  Patient Instructions: 1)  Stop taking fureosemide.  2)  If you notice increased leg swelling, try raising your legs up above your heart level, or light compression stockings 3)  New lower dose of metoprolol. 4)  Follow-up in 3-4 months or sooner if needed.  Will recheck your cholesterol at your next convenience- no hurry. We can do it at your next appointment if you would like. Prescriptions: TOPROL XL 50 MG XR24H-TAB (METOPROLOL SUCCINATE) take one tablet daily  #30 x 2   Entered and Authorized by:   Delbert Harness MD   Signed by:   Delbert Harness MD on 02/20/2010   Method used:   Historical   RxID:   [id number]    Prevention & Chronic Care Immunizations   Influenza vaccine: Fluvax MCR  (03/06/2008)   Influenza vaccine due: 03/06/2009    Tetanus booster: 12/13/1998: Done.   Tetanus booster due: 12/12/2008    Pneumococcal vaccine: Done.  (03/14/2000)   Pneumococcal vaccine due: None    H. zoster vaccine: 03/21/2008: Prescription given  Colorectal Screening   Hemoccult: Done.  (12/17/2004)    Hemoccult due: Not Indicated    Colonoscopy: Done.  (11/17/2004)   Colonoscopy due: 11/18/2014  Other Screening   Pap smear: Not documented   Pap smear due: Not Indicated    Mammogram: normal  (03/05/2009)   Mammogram action/deferral: Screening mammogram in 1 year.     (09/29/2006)   Mammogram due: 03/05/2010    DXA bone density scan: Done.  (05/14/2006)   DXA scan due: None    Smoking status: never  (02/20/2010)  Diabetes Mellitus   HgbA1C: 6.5  (01/27/2010)   Hemoglobin A1C due: 09/25/2008    Eye exam: normal  (02/09/2008)   Eye exam due: 02/08/2009    Foot exam: yes  (01/27/2009)   High risk foot: Not documented   Foot care education: completed  (03/21/2008)   Foot exam due: 03/21/2009    Urine microalbumin/creatinine ratio: Not documented   Urine microalbumin/cr due: 06/27/2009  Lipids   Total Cholesterol: 134  (02/03/2009)   LDL: 59  (02/03/2009)   LDL Direct: 88  (09/05/2007)   [name]: 46  (02/03/2009)   Triglycerides: 145  (02/03/2009)    SGOT (AST): 14  (02/03/2009)   SGPT (ALT): 17  (02/03/2009)   Alkaline phosphatase: 66  (02/03/2009)   Total bilirubin: 0.3  (02/03/2009)  Hypertension   Last Blood Pressure: 140 / 80  (02/20/2010)   Serum creatinine: 1.82  (09/08/2009)   Serum potassium 4.8  (09/08/2009)    Hypertension flowsheet reviewed?: Yes   Progress toward BP goal: Deteriorated  Self-Management Support :   Personal Goals (by the next clinic visit) :     Personal A1C goal: 7  (01/27/2010)     Personal blood pressure goal: 130/80  (01/27/2010)     Personal LDL goal: 70  (01/27/2010)    Patient will work on the following items until the next clinic visit to reach self-care goals:     Medications and monitoring: take my medicines every day  (02/20/2010)    Diabetes self-management support: Not documented    Hypertension self-management support: Not documented    Hypertension self-management support not done because: Not indicated   (02/20/2010)    Lipid self-management support: Not documented

## 2010-07-14 NOTE — Assessment & Plan Note (Signed)
Summary: F/U  KH   Vital Signs:  Patient profile:   70 year old female Height:      64 inches Weight:      181.9 pounds BMI:     31.34 Temp:     98.1 degrees F oral Pulse rate:   80 / minute BP sitting:   119 / 69  (left arm) Cuff size:   regular  Vitals Entered By: Garen Grams LPN (April 09, 2010 9:30 AM) CC: f/u  Is Patient Diabetic? Yes Did you bring your meter with you today? No Pain Assessment Patient in pain? yes     Location: right leg   Primary Care Provider:  Delbert Harness MD  CC:  f/u .  History of Present Illness: 70 yo with HTN, DM, and CKD here for follow-up  HYPERTENSION Meds: Taking and tolerating? yes- taking metoprolol 100mg  instead of 50mg  prescribed.  Also takes imdur Home BP's: has a weekly nursing student come by her living facility- will write down blood pressures and bring them to next appt Chest Pain: no, has not needed nitro Dyspnea:  no  DIABETES Meds: diet controlled Blood sugars: 135 14 day 143 month, this am 121 Hypoglycemic symptoms: no Visual problems: no Numbness/Tingling: no  HYPERLIPIDEMIA Meds: pravastatin- cardiology d/c it due to concerns for muscle cramps Diet pattern: seeing dr. Gerilyn Pilgrim Exercise: none regulalry  CKD:  last labwork showed cr above baseline, K elevated at 5.4.  In past had arf due to dehydration from abd secondary to bowerl obstruction.  No symptoms today.  etaing well, no constipation.  Habits & Providers  Alcohol-Tobacco-Diet     Tobacco Status: never     Year Quit: 2006  Current Medications (verified): 1)  Aspirin Adult Low Strength 81 Mg Tbec (Aspirin) 2)  Plavix 75 Mg Tabs (Clopidogrel Bisulfate) 3)  Nitroglycerin 0.4 Mg Subl (Nitroglycerin) .... As Needed 4)  Isosorbide Mononitrate Cr 60 Mg  Tb24 (Isosorbide Mononitrate) .... One Tablet Daily 5)  Toprol Xl 50 Mg Xr24h-Tab (Metoprolol Succinate) .... Take One Tablet Daily 6)  Diltiazem Hcl Cr 180 Mg Xr24h-Cap (Diltiazem Hcl) .... Take One Tablet  Daily 7)  Pravastatin Sodium 40 Mg Tabs (Pravastatin Sodium) .Marland Kitchen.. 1 Tablet By Mouth At Bedtime For Cholesterol - To Replace Simvastatin 8)  Clozapine 100 Mg Tabs (Clozapine) .... 3 Tablets By Mouth Daily 9)  Synthroid 50 Mcg Tabs (Levothyroxine Sodium) .... Take 1 Tablet By Mouth Once A Day 10)  Fish Oil 1000 Mg  Caps (Omega-3 Fatty Acids) .... 2 Capsules Daily (Otc) 11)  Pantoprazole Sodium 40 Mg Tbec (Pantoprazole Sodium) .... Take One Tablet Daily 12)  Ventolin Hfa 108 (90 Base) Mcg/act  Aers (Albuterol Sulfate) .Marland Kitchen.. 1-2 Puffs Inh Q4 Hrs As Needed For Shortness of Breath 13)  Tylenol 8 Hour 650 Mg Cr-Tabs (Acetaminophen) .Marland Kitchen.. 1 Tab By Mouth Q 8 Hours As Needed Pain; Use Least Effective Amount 14)  Calcium 600/vitamin D 600-400 Mg-Unit  Tabs (Calcium Carbonate-Vitamin D) 15)  Hydroxyzine Hcl 25 Mg Tabs (Hydroxyzine Hcl) .Marland Kitchen.. 1 Tablet By Mouth Two Times A Day As Needed For Itching 16)  Diabetic Shoes .... Dx:  Diabetes Wtih Exostosis S/p Great Toe Fracture 17)  Triamcinolone Acetonide 0.5 % Oint (Triamcinolone Acetonide) .... Apply To Affected Areas Two Times A Day For No More Than 2 Weeks - Disp 60 Grams 18)  Lidoderm 5 % Ptch (Lidocaine) .... Apply Up To 3 Patches At A Time.  Use For 12 Hours; Then Remove For 12 Hours.  19)  Miralax  Powd (Polyethylene Glycol 3350) .Marland KitchenMarland KitchenMarland Kitchen 17 G Dissolved in 8 Ozs Fluid Daily As Needed Constipation.  Allergies: No Known Drug Allergies PMH-FH-SH reviewed for relevance  Review of Systems      See HPI  Physical Exam  General:  well appearing, NAD. Lungs:  Normal respiratory effort, chest expands symmetrically. Lungs are clear to auscultation, no crackles or wheezes. Heart:  Normal rate and regular rhythm. S1 and S2 normal without gallop, murmur, click, rub or other extra sounds. Abdomen:  Bowel sounds positive,abdomen soft and non-tender without masses, organomegaly or hernias noted. Pulses:  R posterior tibial normal, R dorsalis pedis normal, L posterior  tibial normal, and L dorsalis pedis normal.   Extremities:  no LE edema, no tenderness to palpation of calves, no varicosities  Diabetes Management Exam:    Foot Exam (with socks and/or shoes not present):       Sensory-Pinprick/Light touch:          Left medial foot (L-4): normal          Left dorsal foot (L-5): normal          Left lateral foot (S-1): normal          Right medial foot (L-4): normal          Right dorsal foot (L-5): normal          Right lateral foot (S-1): normal       Sensory-Monofilament:          Left foot: normal          Right foot: normal       Inspection:          Left foot: normal          Right foot: normal       Nails:          Left foot: normal          Right foot: normal   Impression & Recommendations:  Problem # 1:  HYPERTENSION, BENIGN SYSTEMIC (ICD-401.1)  BP at goal.  She had been of antihypertensives after last hospitalization due to low BP's in the hospital.  We restarted her metoprolol at 50 mg daily but instead she took 100 mg daily.  BP at goal but concerned she runs low at times when reveiwing trend.  Advised resuming 50 mg daily and will bring BP lg to next visi.  Advised if consistanetly runs > 130/80 will increase back to 100mg .  The following medications were removed from the medication list:    Furosemide 20 Mg Tabs (Furosemide) .Marland Kitchen... Take 1 tablet by mouth once a day Her updated medication list for this problem includes:    Toprol Xl 50 Mg Xr24h-tab (Metoprolol succinate) .Marland Kitchen... Take one tablet daily    Diltiazem Hcl Cr 180 Mg Xr24h-cap (Diltiazem hcl) .Marland Kitchen... 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  Log reviewed today, doing well.  No changes today  Her updated medication list for this problem includes:    Aspirin Adult Low Strength 81 Mg Tbec (Aspirin)  Labs Reviewed: Creat: 2.08 (03/31/2010)   Microalbumin:  1+ (02/03/2009)  Last Eye Exam: normal (02/09/2008) Reviewed HgBA1c results: 6.5 (01/27/2010)  6.9 (09/08/2009)  Orders: [facility]- Est  Level 4 (47829)  Problem # 3:  HYPERCHOLESTEROLEMIA (ICD-272.0)  Off statin per cards due to leg cramps.  I fwd last results to cardiology.  LDL at goal, [name] low, advised increasing exercise.  Will recheck at next visit when she's been off statin.  may co nsider trial of lower dosing or every other day as I'm not sure her cramps are statin related. Her updated medication list for this problem includes:    Pravastatin Sodium 40 Mg Tabs (Pravastatin sodium) .Marland Kitchen... 1 tablet by mouth at bedtime for cholesterol - to replace simvastatin  Labs Reviewed: SGOT: 14 (03/31/2010)   SGPT: 18 (03/31/2010)  Lipid Goals: Chol Goal: 200 (07/05/2007)   [name] Goal: 40 (07/05/2007)   LDL Goal: 70 (07/05/2007)   TG Goal: 150 (07/05/2007)  Prior 10 Yr Risk Heart Disease: N/A (07/05/2007)   [name]:38 (03/31/2010), 46 (02/03/2009)  LDL:58 (03/31/2010), 59 (02/03/2009)  Chol:131 (03/31/2010), 134 (02/03/2009)  [name]:177 (03/31/2010), 145 (02/03/2009)  Orders: [facility]- Est  Level 4 (56213)  Problem # 4:  LEG CRAMPS (ICD-729.82)  off statin per cardiology.  She tells me they have ordered an ABI.  Symptoms do nto sound classicly like claudication, good pulses today.  WIll follow along.  Orders: [facility]- Est  Level 4 (08657)  Problem # 5:  Preventive Health Care (ICD-V70.0) already received flu shot, will give tdap today.  she has beenr eminded she is due for mamogram  Complete Medication List: 1)  Aspirin Adult Low Strength 81 Mg Tbec (Aspirin) 2)  Plavix 75 Mg Tabs (Clopidogrel bisulfate) 3)  Nitroglycerin 0.4 Mg Subl (Nitroglycerin) ....  As needed 4)  Isosorbide Mononitrate Cr 60 Mg Tb24 (Isosorbide mononitrate) .... One tablet daily 5)  Toprol Xl 50 Mg Xr24h-tab (Metoprolol succinate) .... Take one tablet daily 6)  Diltiazem Hcl Cr 180 Mg Xr24h-cap (Diltiazem hcl) .... Take one tablet daily 7)  Pravastatin Sodium 40 Mg Tabs (Pravastatin sodium) .Marland Kitchen.. 1 tablet by mouth at bedtime for cholesterol - to replace simvastatin 8)  Clozapine 100 Mg Tabs (Clozapine) .... 3 tablets by mouth daily 9)  Synthroid 50 Mcg Tabs (Levothyroxine sodium) .... Take 1 tablet by mouth once a day 10)  Fish Oil 1000 Mg Caps (Omega-3 fatty acids) .... 2 capsules daily (otc) 11)  Pantoprazole Sodium 40 Mg Tbec (Pantoprazole sodium) .... Take one tablet daily 12)  Ventolin Hfa 108 (90 Base) Mcg/act Aers (Albuterol sulfate) .Marland Kitchen.. 1-2 puffs inh q4 hrs as needed for shortness of breath 13)  Tylenol 8 Hour 650 Mg Cr-tabs (Acetaminophen) .Marland Kitchen.. 1 tab by mouth q 8 hours as needed pain; use least effective amount 14)  Calcium 600/vitamin D 600-400 Mg-unit Tabs (Calcium carbonate-vitamin d) 15)  Hydroxyzine Hcl 25 Mg Tabs (Hydroxyzine hcl) .Marland Kitchen.. 1 tablet by mouth two times a day as needed for itching 16)  Diabetic Shoes  .... Dx:  diabetes wtih exostosis s/p great toe fracture 17)  Triamcinolone Acetonide 0.5 % Oint (Triamcinolone acetonide) .... Apply to affected areas two times a day for no more than 2 weeks - disp 60 grams 18)  Lidoderm 5 % Ptch (Lidocaine) .... Apply up to 3 patches at a time.  use for 12 hours; then remove for 12 hours. 19)  Miralax Powd (Polyethylene glycol 3350) .Marland KitchenMarland KitchenMarland Kitchen 17 g dissolved in 8 ozs fluid daily as needed constipation.  Other Orders: Basic [facility] [phone]) Tdap => 56yrs IM (  13086) Admin 1st Vaccine (57846)  Patient Instructions: 1)  Will check your labs today and monitor your kidney function and potassium.  If they are the same, we do not need to make any changes- I will send you al etter 2)  I would recommend taking metoprolol at  50 mg a day andm onitoring your blood pressure- bring yoru log to next visit.  Your goal is less than 130/80.  If you notice it consistaently runs higher than this, we can go back to 100 mg a day. 3)  follow-up in 4 months or sooner if needed.   Orders Added: 1)  [facility] [[id number]] 2)  Tdap => 62yrs IM [[id number]] 3)  Admin 1st Vaccine [[id number]] 4)  [facility]- Est  Level 4 [24401]   Immunizations Administered:  Tetanus Vaccine:    Vaccine Type: Tdap    Site: left deltoid    Mfr: GlaxoSmithKline    Dose: 0.5 ml    Route: IM    Given by: Garen Grams LPN    Exp. Date: 04/02/2012    Lot #: [id number]    VIS given: 05/01/08 version given April 09, 2010.   Immunizations Administered:  Tetanus Vaccine:    Vaccine Type: Tdap    Site: left deltoid    Mfr: GlaxoSmithKline    Dose: 0.5 ml    Route: IM    Given by: Garen Grams LPN    Exp. Date: 04/02/2012    Lot #: [id number]    VIS given: 05/01/08 version given April 09, 2010.   Prevention & Chronic Care Immunizations   Influenza vaccine: Fluvax MCR  (03/06/2008)   Influenza vaccine due: 02/12/2010    Tetanus booster: 04/09/2010: Tdap   Tetanus booster due: 12/12/2008    Pneumococcal vaccine: Done.  (03/14/2000)   Pneumococcal vaccine due: None    H. zoster vaccine: 03/21/2008: Prescription given  Colorectal Screening   Hemoccult: Done.  (12/17/2004)   Hemoccult due: Not Indicated    Colonoscopy: Done.  (11/17/2004)   Colonoscopy due: 11/18/2014  Other Screening   Pap smear: Not documented   Pap smear due: Not Indicated    Mammogram: normal  (03/05/2009)   Mammogram action/deferral: Screening mammogram in 1 year.     (09/29/2006)   Mammogram due: 03/05/2010    DXA bone density scan: Done.  (05/14/2006)   DXA scan due: None    Smoking status: never  (04/09/2010)  Diabetes Mellitus   HgbA1C: 6.5  (01/27/2010)   Hemoglobin A1C due: 09/25/2008    Eye exam: normal  (02/09/2008)   Eye exam due:  02/08/2009    Foot exam: yes  (04/09/2010)   High risk foot: Not documented   Foot care education: completed  (03/21/2008)   Foot exam due: 03/21/2009    Urine microalbumin/creatinine ratio: Not documented   Urine microalbumin/cr due: 06/27/2009    Diabetes flowsheet reviewed?: Yes   Progress toward A1C goal: At goal  Lipids   Total Cholesterol: 131  (03/31/2010)   LDL: 58  (03/31/2010)   LDL Direct: 88  (09/05/2007)   [name]: 38  (03/31/2010)   Triglycerides: 177  (03/31/2010)    SGOT (AST): 14  (03/31/2010)   SGPT (ALT): 18  (03/31/2010)   Alkaline phosphatase: 58  (03/31/2010)   Total bilirubin: 0.4  (03/31/2010)    Lipid flowsheet reviewed?: Yes   Progress toward LDL goal: At goal  Hypertension   Last Blood Pressure: 119 / 69  (04/09/2010)   Serum creatinine: 2.08  (03/31/2010)  Serum potassium 5.4  (03/31/2010)    Hypertension flowsheet reviewed?: Yes   Progress toward BP goal: At goal  Self-Management Support :   Personal Goals (by the next clinic visit) :     Personal A1C goal: 7  (01/27/2010)     Personal blood pressure goal: 130/80  (01/27/2010)     Personal LDL goal: 70  (01/27/2010)    Diabetes self-management support: Not documented    Hypertension self-management support: Not documented    Hypertension self-management support not done because: Not indicated  (02/20/2010)    Lipid self-management support: Not documented

## 2010-07-14 NOTE — Assessment & Plan Note (Signed)
Summary: hospital f/u eo   Vital Signs:  Patient profile:   70 year old female Weight:      184.4 pounds Temp:     98.2 degrees F oral Pulse rate:   87 / minute Pulse rhythm:   regular BP sitting:   94 / 60  (left arm) Cuff size:   large  Vitals Entered By: Loralee Pacas CMA (September 15, 2009 2:08 PM)  Primary Care Provider:  Delbert Harness MD   History of Present Illness: 70 yo here today for hospital follow-up.  Was admitted 3/20-3/28 for acute on chronic renal failure with oliguria due to diarrhea of questionable viral vs bacterial origin.  By hospital discharge patient's Cr was back at baseline, diarrhea hd resolved.  Psych consult was obtained during hospitalization due to flare of schizoaffective disorder.  Was seen by Dr. Jeanie Sewer who did not make any medication changes.  Patient was discharged on all home medications.  Patient continues to have some intermittant loose stools but no blood, fever, abd pain.  Also intermittantly takes miralax and stool softener for constipation.  DIABETES Meds:diet controlled Blood sugars: 14 day avg 172, 30 day average 193.  Hga1c in hospital 6.9 Hypoglycemic symptoms:no patient had an isolated reading in the 300's which was not associated with high carb meal and was concerned if her meter is working correctly.        Current Medications (verified): 1)  Aspirin Adult Low Strength 81 Mg Tbec (Aspirin) 2)  Plavix 75 Mg Tabs (Clopidogrel Bisulfate) 3)  Nitroglycerin 0.4 Mg Subl (Nitroglycerin) .... As Needed 4)  Isosorbide Mononitrate Cr 60 Mg  Tb24 (Isosorbide Mononitrate) .... One Tablet Daily 5)  Toprol Xl 100 Mg Tb24 (Metoprolol Succinate) .... Take One Tablet Daily 6)  Cardizem Cd 240 Mg Cp24 (Diltiazem Hcl Coated Beads) 7)  Furosemide 20 Mg Tabs (Furosemide) .... Take 1 Tablet By Mouth Once A Day 8)  Pravastatin Sodium 40 Mg Tabs (Pravastatin Sodium) .Marland Kitchen.. 1 Tablet By Mouth At Bedtime For Cholesterol - To Replace Simvastatin 9)   Clozapine 100 Mg Tabs (Clozapine) .... 2 Tablets By Mouth Daily 10)  Synthroid 50 Mcg Tabs (Levothyroxine Sodium) .... Take 1 Tablet By Mouth Once A Day 11)  Fish Oil 1000 Mg  Caps (Omega-3 Fatty Acids) .... 2 Capsules Daily (Otc) 12)  Omeprazole 20 Mg  Cpdr (Omeprazole) .... Take One Tablet Daily For Reflux 13)  Ventolin Hfa 108 (90 Base) Mcg/act  Aers (Albuterol Sulfate) .Marland Kitchen.. 1-2 Puffs Inh Q4 Hrs As Needed For Shortness of Breath 14)  Tylenol 8 Hour 650 Mg Cr-Tabs (Acetaminophen) .Marland Kitchen.. 1 Tab By Mouth Q 8 Hours As Needed Pain; Use Least Effective Amount 15)  Calcium 600/vitamin D 600-400 Mg-Unit  Tabs (Calcium Carbonate-Vitamin D) 16)  Hydroxyzine Hcl 25 Mg Tabs (Hydroxyzine Hcl) .Marland Kitchen.. 1 Tablet By Mouth Two Times A Day As Needed For Itching 17)  Diabetic Shoes .... Dx:  Diabetes Wtih Exostosis S/p Great Toe Fracture 18)  H. Zoster Vaccine .... Please Administer X 1 19)  Triamcinolone Acetonide 0.5 % Oint (Triamcinolone Acetonide) .... Apply To Affected Areas Two Times A Day For No More Than 2 Weeks - Disp 60 Grams 20)  Triderm 0.1 % Crea (Triamcinolone Acetonide) .... 60 Grams Compounded in 240 Ml of Cetaphil Lotion - Apply Once or Twice Daily and After Bathing 21)  Lidoderm 5 % Ptch (Lidocaine) .... Apply Up To 3 Patches At A Time.  Use For 12 Hours; Then Remove For 12 Hours. 22)  Miralax  Powd (Polyethylene Glycol 3350) .Marland KitchenMarland KitchenMarland Kitchen 17 G Dissolved in 8 Ozs Fluid Daily As Needed Constipation. 23)  Zostavax 16109 Unt/0.73ml Solr (Zoster Vaccine Live) .... Administer As Single Dose  Allergies (verified): No Known Drug Allergies  Review of Systems      See HPI General:  Denies fatigue and fever. GI:  Complains of change in bowel habits, constipation, diarrhea, and gas; denies abdominal pain, bloody stools, nausea, and vomiting.  Physical Exam  General:  AxO.  present today with daughter.  In good spirits. Lungs:  Normal respiratory effort, chest expands symmetrically. Lungs are clear to auscultation,  no crackles or wheezes. Heart:  Normal rate and regular rhythm. S1 and S2 normal without gallop, murmur, click, rub or other extra sounds. Abdomen:  + BS, soft, mildly distended, no tenderness to palpation.   Impression & Recommendations:  Problem # 1:  GASTROENTERITIS WITHOUT DEHYDRATION (ICD-558.9)  Resolved.  Some residual intermittant diarrhea, possibly due to stool softener use.  No red flags.  Asked patient to notice patterns of foods eaten when she has diarreha as well as watch her stool softener adn miralax use carefully.  If continues to have problems, or has fever, blood, come back sooner.   May also have component of dietary indescretion or autonomic dysfunction due to diabetes.  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  6.9 in hospital.  Daughter notes increased dietary indescretion as contributing factor.  Patient has been decreasingly able to manage intake due to ? compulsive eating but patient also aware and would like to further focus on diabetes.  Wil refer to Dr. Gerilyn Pilgrim for nutrition counseling for diabetes- patient given diabetes food log and diet history.  Plans to make appt and have her daughter accompany her.  f/u in 3 months or sooner if needed,.  Her updated medication list for this problem includes:    Aspirin Adult Low Strength 81 Mg Tbec (Aspirin)  Orders: Glucose-[facility] ([id number]) [facility]- Est  Level 4 (29562)  Labs Reviewed: Creat: 1.69 (02/04/2009)   Microalbumin: 1+ (02/03/2009)  Last Eye Exam: normal (02/09/2008) Reviewed HgBA1c results: 6.8 (01/16/2009)  6.5 (06/27/2008)  Problem # 3:  RENAL INSUFFICIENCY, CHRONIC (ICD-586)  back to baseline.  On admission Cr up to 5.  Sees renal q 6 months.  Orders: [facility]- Est  Level 4 (13086)  Problem # 4:  SCHIZOAFFECTIVE DISORDER - STABLE (ICD-295.70)  back at baseline.  Plans to see psychiatrist Dr.  Donell Beers this week.  Orders: [facility]- Est  Level 4 (57846)  Complete Medication List: 1)  Aspirin Adult Low Strength 81 Mg Tbec (Aspirin) 2)  Plavix 75 Mg Tabs (Clopidogrel bisulfate) 3)  Nitroglycerin 0.4 Mg Subl (Nitroglycerin) .... As needed 4)  Isosorbide Mononitrate Cr 60 Mg Tb24 (Isosorbide mononitrate) .... One tablet daily 5)  Toprol Xl 100 Mg Tb24 (Metoprolol succinate) .... Take one tablet daily 6)  Cardizem Cd 240 Mg Cp24 (Diltiazem hcl coated beads) 7)  Furosemide 20 Mg Tabs (Furosemide) .... Take 1 tablet by mouth once a day 8)  Pravastatin Sodium 40 Mg Tabs (Pravastatin sodium) .Marland Kitchen.. 1 tablet by mouth at bedtime for cholesterol - to replace simvastatin 9)  Clozapine 100 Mg Tabs (Clozapine) .... 2 tablets by mouth daily 10)  Synthroid 50 Mcg Tabs (Levothyroxine sodium) .... Take 1 tablet by mouth once a day 11)  Fish Oil 1000 Mg Caps (Omega-3 fatty acids) .... 2  capsules daily (otc) 12)  Omeprazole 20 Mg Cpdr (Omeprazole) .... Take one tablet daily for reflux 13)  Ventolin Hfa 108 (90 Base) Mcg/act Aers (Albuterol sulfate) .Marland Kitchen.. 1-2 puffs inh q4 hrs as needed for shortness of breath 14)  Tylenol 8 Hour 650 Mg Cr-tabs (Acetaminophen) .Marland Kitchen.. 1 tab by mouth q 8 hours as needed pain; use least effective amount 15)  Calcium 600/vitamin D 600-400 Mg-unit Tabs (Calcium carbonate-vitamin d) 16)  Hydroxyzine Hcl 25 Mg Tabs (Hydroxyzine hcl) .Marland Kitchen.. 1 tablet by mouth two times a day as needed for itching 17)  Diabetic Shoes  .... Dx:  diabetes wtih exostosis s/p great toe fracture 18)  H. Zoster Vaccine  .... Please administer x 1 19)  Triamcinolone Acetonide 0.5 % Oint (Triamcinolone acetonide) .... Apply to affected areas two times a day for no more than 2 weeks - disp 60 grams 20)  Triderm 0.1 % Crea (Triamcinolone acetonide) .... 60 grams compounded in 240 ml of cetaphil lotion - apply once or twice daily and after bathing 21)  Lidoderm 5 % Ptch (Lidocaine) .... Apply up to 3 patches at a time.   use for 12 hours; then remove for 12 hours. 22)  Miralax Powd (Polyethylene glycol 3350) .Marland KitchenMarland KitchenMarland Kitchen 17 g dissolved in 8 ozs fluid daily as needed constipation. 23)  Zostavax 98119 Unt/0.27ml Solr (Zoster vaccine live) .... Administer as single dose  Patient Instructions: 1)  Make appointment on your way out with Dr. Gerilyn Pilgrim to discuss diabetes and nutrition. 2)  Follow-up with me in 3 months or sooner if needed. 3)  If your stools are too loose, do not take miralax or stool softener.

## 2010-07-14 NOTE — Consult Note (Signed)
Summary: [facility]:Nuclear stress- normal  [facility]   Imported By: Clydell Hakim 07/28/2009 16:05:45  _____________________________________________________________________  External Attachment:    Type:   Image     Comment:   External Document

## 2010-07-14 NOTE — Miscellaneous (Signed)
Summary: CBC, Ferritin, B12, Iron  Clinical Lists Changes  Observations: Added new observation of B12: 658 pg/mL (11/21/2009 23:11) Added new observation of FERRITIN: 198 ng/mL (11/21/2009 23:11) Added new observation of IRON SATUR %: 14.1 % (11/21/2009 23:11) Added new observation of TIBC: 354 mcg/dL (16/03/9603 54:09) Added new observation of UIBC: 304 mcg/dL (81/19/1478 29:56) Added new observation of IRON: 50 mcg/dL (21/30/8657 84:69) Added new observation of PLATELETK/UL: 191 K/uL (11/21/2009 23:11) Added new observation of MCV: 86 fL (11/21/2009 23:11) Added new observation of HCT: 28.5 % (11/21/2009 23:11) Added new observation of HGB: 9.9 g/dL (62/95/2841 32:44) Added new observation of WBC COUNT: 9 10*3/microliter (11/21/2009 23:11)

## 2010-07-14 NOTE — Progress Notes (Signed)
Summary: home health physical therapy  Phone Note From Other Clinic Call back at [phone]   Caller: [facility] Summary of Call: wanted MD to know he has been seeing pt for physical therapy and will be seeing her 1 more time Initial call taken by: Knox Royalty,  September 16, 2009 4:07 PM  Follow-up for Phone Call        To PCP Follow-up by: Gladstone Pih,  September 16, 2009 4:20 PM  Additional Follow-up for Phone Call Additional follow up Details #1::        Thanks for update Additional Follow-up by: Delbert Harness MD,  September 17, 2009 11:22 AM

## 2010-07-14 NOTE — Assessment & Plan Note (Signed)
Summary: review downtrend of HGB/Winslow/briscoe   Vital Signs:  Patient profile:   70 year old female Height:      64 inches Weight:      179.6 pounds BMI:     30.94 Temp:     97.7 degrees F oral Pulse rate:   80 / minute BP sitting:   116 / 69  (left arm) Cuff size:   regular  Vitals Entered By: Garen Grams LPN (Nov 04, 2009 2:55 PM) CC: f/u hemoglobin Is Patient Diabetic? Yes Did you bring your meter with you today? No Pain Assessment Patient in pain? yes     Location: right arm   Primary Care Provider:  Delbert Harness MD  CC:  f/u hemoglobin.  History of Present Illness: 1. Anemia:  Pt here for an office visit to discuss her anemia.  She had labs drawn by Dr. Katha Hamming last week because they are monitoring her Clozapine levels.  They showed a hgb of 9.2 which had been trending down according to Dr. Katha Hamming.  She has a hx of chronic anemia and was taking iron pills before.  She hasn't taken them for months because it made her constipated.      PMHx: CKD, Schizoaffective disorder      ROS: denies rectal bleeding, vaginal bleeding, weakness, fatigue, chest pain, shortness of breath  2. Right arm pain:  has had right arm pain for a couple of months.  She did not remember injurying her arm.  It is located in the proximal left arm near the shoulder.  She always has decreased ROM in the right shoulder.        ROSl She denies any swelling, redness, warmth, or rashes  Habits & Providers  Alcohol-Tobacco-Diet     Tobacco Status: never  Current Medications (verified): 1)  Aspirin Adult Low Strength 81 Mg Tbec (Aspirin) 2)  Plavix 75 Mg Tabs (Clopidogrel Bisulfate) 3)  Nitroglycerin 0.4 Mg Subl (Nitroglycerin) .... As Needed 4)  Isosorbide Mononitrate Cr 60 Mg  Tb24 (Isosorbide Mononitrate) .... One Tablet Daily 5)  Toprol Xl 100 Mg Tb24 (Metoprolol Succinate) .... Take One Tablet Daily 6)  Cardizem Cd 240 Mg Cp24 (Diltiazem Hcl Coated Beads) 7)  Furosemide 20 Mg Tabs (Furosemide) ....  Take 1 Tablet By Mouth Once A Day 8)  Pravastatin Sodium 40 Mg Tabs (Pravastatin Sodium) .Marland Kitchen.. 1 Tablet By Mouth At Bedtime For Cholesterol - To Replace Simvastatin 9)  Clozapine 100 Mg Tabs (Clozapine) .... 2 Tablets By Mouth Daily 10)  Synthroid 50 Mcg Tabs (Levothyroxine Sodium) .... Take 1 Tablet By Mouth Once A Day 11)  Fish Oil 1000 Mg  Caps (Omega-3 Fatty Acids) .... 2 Capsules Daily (Otc) 12)  Omeprazole 20 Mg  Cpdr (Omeprazole) .... Take One Tablet Daily For Reflux 13)  Ventolin Hfa 108 (90 Base) Mcg/act  Aers (Albuterol Sulfate) .Marland Kitchen.. 1-2 Puffs Inh Q4 Hrs As Needed For Shortness of Breath 14)  Tylenol 8 Hour 650 Mg Cr-Tabs (Acetaminophen) .Marland Kitchen.. 1 Tab By Mouth Q 8 Hours As Needed Pain; Use Least Effective Amount 15)  Calcium 600/vitamin D 600-400 Mg-Unit  Tabs (Calcium Carbonate-Vitamin D) 16)  Hydroxyzine Hcl 25 Mg Tabs (Hydroxyzine Hcl) .Marland Kitchen.. 1 Tablet By Mouth Two Times A Day As Needed For Itching 17)  Diabetic Shoes .... Dx:  Diabetes Wtih Exostosis S/p Great Toe Fracture 18)  H. Zoster Vaccine .... Please Administer X 1 19)  Triamcinolone Acetonide 0.5 % Oint (Triamcinolone Acetonide) .... Apply To Affected Areas Two Times A  Day For No More Than 2 Weeks - Disp 60 Grams 20)  Triderm 0.1 % Crea (Triamcinolone Acetonide) .... 60 Grams Compounded in 240 Ml of Cetaphil Lotion - Apply Once or Twice Daily and After Bathing 21)  Lidoderm 5 % Ptch (Lidocaine) .... Apply Up To 3 Patches At A Time.  Use For 12 Hours; Then Remove For 12 Hours. 22)  Miralax  Powd (Polyethylene Glycol 3350) .Marland KitchenMarland KitchenMarland Kitchen 17 G Dissolved in 8 Ozs Fluid Daily As Needed Constipation. 23)  Zostavax 41324 Unt/0.61ml Solr (Zoster Vaccine Live) .... Administer As Single Dose  Allergies: No Known Drug Allergies  Past History:  Past Medical History: Reviewed history from 06/27/2008 and no changes required. CAD s/p  RCA stent 01/2005 (Taxus 3.5 mm x 20 mm) - on Plavix (Cardiologist - Dr. Jenne Campus --> Lynnea Ferrier)       - Nuclear stress  test 9/09 - Dr. Lynnea Ferrier - normal, EF 67% GFR 40 Stage 3 CKD likely 2/2 DM - baseline 1.4-1.6 Hx of Multiple psych hospitalizations:  schizoaffective do [facility]) - more recently has been stable Lt shoulder X ray 2/08 - Rotator cuff calcifications at insertion site. no acute findings. RUQ U/S-acute acalculas cholecystitis - 05/21/2002 Sleep study no OSA, no RLS - 10/22/2005 Hosp 2/08 Rhabdo, ARF hosp 1/04 part SBO vs ileus and prerenal ARF Chronic diarrhea BMD - calcaneal T = 0.04 - 01/17/2001  Social History: Reviewed history from 08/09/2008 and no changes required. One PPD X 35 years quit 11/05, also 3 glasses per day of caffeine; No ETOH or other drugs; Lives at [facility].  Daughter comes to most appointments - very involved in care:  [name] [phone], [phone].    Physical Exam  General:  AxO.  present today with daughter.  In good spirits. Head:  normocephalic and atraumatic.   Eyes:  normal conjuctiva Mouth:  moist mucous membranes Neck:  supple.   Lungs:  normal respiratory effort, normal breath sounds, no crackles, and no wheezes.   Heart:  Normal rate and regular rhythm. S1 and S2 normal without gallop, murmur, click, rub or other extra sounds. Abdomen:  soft, non-tender, and normal bowel sounds.   Msk:  R arm TTP over proximal, anterior humerus.  No swelling, redness, or warmth.  No bruising.  Decreased ROM in her right shoulder but that is unchanged from baseline. Skin:  turgor normal.  pale appearing Psych:  poor concentration.     Impression & Recommendations:  Problem # 1:  ANEMIA (ICD-285.9) Assessment Unchanged Stable at this office visit.  No signs of active bleeding.  Will order some anemia labs to get drawn with her next lab visit.  Asked them to send results over to this clinic.  Also advised her to start taking her Iron pills again. Orders: B12-[facility] ([id number]) CBC-[facility] [phone]) Ferritin-[facility] [id number]) Iron -[facility] [phone]) Iron Binding Cap  (TIBC)-[facility] ([id number]) [facility]- Est Level  3 (22025)  Problem # 2:  ARM PAIN, RIGHT (ICD-729.5) Assessment: New  Not concerning for significant injury.  Tylenol as needed   Orders: [facility]- Est Level  3 ([id number])  Complete Medication List: 1)  Aspirin Adult Low Strength 81 Mg Tbec (Aspirin) 2)  Plavix 75 Mg Tabs (Clopidogrel bisulfate) 3)  Nitroglycerin 0.4 Mg Subl (Nitroglycerin) .... As needed 4)  Isosorbide Mononitrate Cr 60 Mg Tb24 (Isosorbide mononitrate) .... One tablet daily 5)  Toprol Xl 100 Mg Tb24 (Metoprolol succinate) .... Take one tablet daily 6)  Cardizem Cd 240 Mg Cp24 (Diltiazem hcl coated beads) 7)  Furosemide 20 Mg Tabs (Furosemide) .... Take 1 tablet by mouth once a day 8)  Pravastatin Sodium 40 Mg Tabs (Pravastatin sodium) .Marland Kitchen.. 1 tablet by mouth at bedtime for cholesterol - to replace simvastatin 9)  Clozapine 100 Mg Tabs (Clozapine) .... 2 tablets by mouth daily 10)  Synthroid 50 Mcg Tabs (Levothyroxine sodium) .... Take 1 tablet by mouth once a day 11)  Fish Oil 1000 Mg Caps (Omega-3 fatty acids) .... 2 capsules daily (otc) 12)  Omeprazole 20 Mg Cpdr (Omeprazole) .... Take one tablet daily for reflux 13)  Ventolin Hfa 108 (90 Base) Mcg/act Aers (Albuterol sulfate) .Marland Kitchen.. 1-2 puffs inh q4 hrs as needed for shortness of breath 14)  Tylenol 8 Hour 650 Mg Cr-tabs (Acetaminophen) .Marland Kitchen.. 1 tab by mouth q 8 hours as needed pain; use least effective amount 15)  Calcium 600/vitamin D 600-400 Mg-unit Tabs (Calcium carbonate-vitamin d) 16)  Hydroxyzine Hcl 25 Mg Tabs (Hydroxyzine hcl) .Marland Kitchen.. 1 tablet by mouth two times a day as needed for itching 17)  Diabetic Shoes  .... Dx:  diabetes wtih exostosis s/p great toe fracture 18)  H. Zoster Vaccine  .... Please administer x 1 19)  Triamcinolone Acetonide 0.5 % Oint (Triamcinolone acetonide) .... Apply to affected areas two times a day for no more than 2 weeks - disp 60 grams 20)  Triderm 0.1 % Crea (Triamcinolone acetonide) .... 60 grams  compounded in 240 ml of cetaphil lotion - apply once or twice daily and after bathing 21)  Lidoderm 5 % Ptch (Lidocaine) .... Apply up to 3 patches at a time.  use for 12 hours; then remove for 12 hours. 22)  Miralax Powd (Polyethylene glycol 3350) .Marland KitchenMarland KitchenMarland Kitchen 17 g dissolved in 8 ozs fluid daily as needed constipation. 23)  Zostavax 81191 Unt/0.30ml Solr (Zoster vaccine live) .... Administer as single dose  Other Orders: Hemoglobin-[facility] ([id number])  Laboratory Results   Blood Tests   Date/Time Received: Nov 04, 2009 2:55 PM  Date/Time Reported: Nov 04, 2009 3:02 PM     CBC   HGB:  11.1 g/dL   (Normal Range: 56.2-13.0 in Males, 12.0-15.0 in Females) Comments: capillary sample ...............test performed by......Marland KitchenBonnie A. [place], MLS (ASCP)cm

## 2010-07-14 NOTE — Miscellaneous (Signed)
Summary: requests for DME  Clinical Lists Changes rec'd faxes from [employer] for seat lift & back belt. faxed back to the company that we do not accept faxed requests. must come from pt.Golden Circle RN  Nov 06, 2009 9:29 AM

## 2010-07-14 NOTE — Miscellaneous (Signed)
Summary: walk in  Clinical Lists Changes c/o knee pain bilaterally for "a long time" she is here for labs & wants to see someone for this. we have no appts at this time. offered appt tomorrow or go to UC today. states she will go to UC.Golden Circle RN  March 31, 2010 10:15 AM

## 2010-07-14 NOTE — Assessment & Plan Note (Signed)
Summary: np/nutr. appt per briscoe/eo   Vital Signs:  Patient profile:   70 year old female Height:      64 inches Weight:      179.8 pounds BMI:     30.97 BP sitting:   115 / 64  Vitals Entered By: Wyona Almas PHD (October 02, 2009 11:41 AM)  Primary Care Provider:  Delbert Harness MD   History of Present Illness: Assessment:  Spent 60 min w/ pt.  [name] was brought to today's appt by her daughter Rheana Casebolt.  [name] lives in a retirement complex, and her usual eating pattern includes oatmeal for bkfst, delivered [employer] for lunch, and home-prepared meal for dinner.  FBG today was 163, which is fairly typical, according to [name].  She is checking BG at other times during the day, with no consistency, i.e., not sure how long after lunch.  Last week's average was 192. [name] kept a food record for the past several days, but it was quite confusing and of questionable completeness.  Currently she does no exercise of any kind, but she was receptive to the idea of walking around her complex, and her daughter said perhaps she could walk with her on some days.    Nutrition Diagnosis:  Excessive mineral intake (sodium) (NI-55.2) related to delivered mid-day meal at retirement community as evidenced by [employer] for lunch daily.  Physical inactivity (NB-2.1) related to lack of motivation as evidenced by no reported activity.    Intervention: See Patient Instructions.    Monitoring/Eval:  Dietary intake, body weight, and exercise at 3-4-wk F/U.  NOTE:  [name] ASKED FOR A MENU PLAN; NEED FOR MUCH DM EDUCATION AND REINFORCEMENT AT F/U VISITS.     Allergies: No Known Drug Allergies   Complete Medication List: 1)  Aspirin Adult Low Strength 81 Mg Tbec (Aspirin) 2)  Plavix 75 Mg Tabs (Clopidogrel bisulfate) 3)  Nitroglycerin 0.4 Mg Subl (Nitroglycerin) .... As needed 4)  Isosorbide Mononitrate Cr 60 Mg Tb24 (Isosorbide mononitrate) .... One tablet daily 5)  Toprol Xl 100 Mg  Tb24 (Metoprolol succinate) .... Take one tablet daily 6)  Cardizem Cd 240 Mg Cp24 (Diltiazem hcl coated beads) 7)  Furosemide 20 Mg Tabs (Furosemide) .... Take 1 tablet by mouth once a day 8)  Pravastatin Sodium 40 Mg Tabs (Pravastatin sodium) .Marland Kitchen.. 1 tablet by mouth at bedtime for cholesterol - to replace simvastatin 9)  Clozapine 100 Mg Tabs (Clozapine) .... 2 tablets by mouth daily 10)  Synthroid 50 Mcg Tabs (Levothyroxine sodium) .... Take 1 tablet by mouth once a day 11)  Fish Oil 1000 Mg Caps (Omega-3 fatty acids) .... 2 capsules daily (otc) 12)  Omeprazole 20 Mg Cpdr (Omeprazole) .... Take one tablet daily for reflux 13)  Ventolin Hfa 108 (90 Base) Mcg/act Aers (Albuterol sulfate) .Marland Kitchen.. 1-2 puffs inh q4 hrs as needed for shortness of breath 14)  Tylenol 8 Hour 650 Mg Cr-tabs (Acetaminophen) .Marland Kitchen.. 1 tab by mouth q 8 hours as needed pain; use least effective amount 15)  Calcium 600/vitamin D 600-400 Mg-unit Tabs (Calcium carbonate-vitamin d) 16)  Hydroxyzine Hcl 25 Mg Tabs (Hydroxyzine hcl) .Marland Kitchen.. 1 tablet by mouth two times a day as needed for itching 17)  Diabetic Shoes  .... Dx:  diabetes wtih exostosis s/p great toe fracture 18)  H. Zoster Vaccine  .... Please administer x 1 19)  Triamcinolone Acetonide 0.5 % Oint (Triamcinolone acetonide) .... Apply to affected areas two times  a day for no more than 2 weeks - disp 60 grams 20)  Triderm 0.1 % Crea (Triamcinolone acetonide) .... 60 grams compounded in 240 ml of cetaphil lotion - apply once or twice daily and after bathing 21)  Lidoderm 5 % Ptch (Lidocaine) .... Apply up to 3 patches at a time.  use for 12 hours; then remove for 12 hours. 22)  Miralax Powd (Polyethylene glycol 3350) .Marland KitchenMarland KitchenMarland Kitchen 17 g dissolved in 8 ozs fluid daily as needed constipation. 23)  Zostavax 52841 Unt/0.66ml Solr (Zoster vaccine live) .... Administer as single dose  Other Orders: Inital Assessment Each - [facility] [phone])  Patient Instructions: 1)  The best time to check  your blood sugar is first thing in the morning before you eat or drink anything.   2)  Walk at least 10 minutes each day, to start.  After the first week, increase by 5 minutes, and increase by 5 minutes each additional week or two, until you get to 30-40 minutes.   3)  At each meal, limit your carbohydrate foods to 2 portions per meal. (See handout to know portion sizes.)  4)  Oatmeal:  Start with 1/3 c dry, and cook with extra water, and after it's cooked, add 2-4 tbsp All Bran cereal and 1/3 c powdered milk.   5)  Vegetables at both lunch and dinner: You can't overdo vegetables! 6)  Schedule a follow-up nutrition appt in 3-4 weeks.

## 2010-07-14 NOTE — Miscellaneous (Signed)
Summary: n&v, diarrhea  Clinical Lists Changes spoke with dtr. states her mom is lethargic. has had n&v, diarrhea, stomach is tight & distended. last void at 8am today.says the last time this happened she ended up in the ED. told her to take her there now due to these symptoms, other diagnosis & age. she agreed.Golden Circle RN  February 12, 2010 2:42 PM  Agreed.  Thanks.  Delbert Harness MD  February 12, 2010 2:57 PM

## 2010-07-14 NOTE — Letter (Signed)
Summary: Results Follow-up Letter  [facility]  [address]   [place], [place] [address]   Phone: [phone]  Fax: [phone]    04/01/2010  [address] [place], [place]  [address]  Dear Ms. Rebecca [name],  I have reviewed your labs.  Your kidney function has decreased and your potassium is elevated.   We will continue to monitor this when I see you for your next visit in 1-2 weeks.  I have forwarded a copy to your cardiologist and nephrologist.  We can discuss you labs in more detail at that time.  There is no need to change anything at this time.    Sincerely,  Delbert Harness MD [facility]

## 2010-07-14 NOTE — Progress Notes (Signed)
Summary: glucose meter  Phone Note Call from Patient Call back at Home Phone [phone]   Reason for Call: Talk to Nurse Summary of Call: pt cannot find her glucose meter, she thinks she may have left it here in our lab? Initial call taken by: Knox Royalty,  September 18, 2009 4:00 PM    called and told pt that we did not have a lost meter.Loralee Pacas CMA  September 19, 2009 10:36 AM

## 2010-07-14 NOTE — Progress Notes (Signed)
  Phone Note Other Incoming   Summary of Call: [facility] need recent labs on patient.  Results not signed off yet by provider.  Please fax to [phone] to the attention of:  JC.   Initial call taken by: Abundio Miu,  April 01, 2010 10:05 AM    forwarded to pcp.Loralee Pacas CMA  April 01, 2010 10:14 AM  Appended Document:  results faxed to [facility] [phone]

## 2010-07-14 NOTE — Miscellaneous (Signed)
Summary: call from Dr. Katha Hamming  Clinical Lists Changes he is her psychiatrist. states they have monitoring her labs due to the meds she is on. her hgb is trending down. the one today is 9.2 & is being faxed. told him I will make Dr. Leonie Green covering md aware & will call pt for an appt.Marland KitchenMarland KitchenGolden Circle RN  Oct 29, 2009 3:35 PM  pt will be here next Tues at 3 pm to see Dr. Lelon Perla.Golden Circle RN  Oct 29, 2009 3:41 PM

## 2010-07-14 NOTE — Consult Note (Signed)
Summary: [facility]  [facility]   Imported By: De Nurse 02/13/2010 11:54:12  _____________________________________________________________________  External Attachment:    Type:   Image     Comment:   External Document

## 2010-07-14 NOTE — Consult Note (Signed)
Summary: [facility]   Imported By: Knox Royalty 04/30/2010 16:25:50  _____________________________________________________________________  External Attachment:    Type:   Image     Comment:   External Document

## 2010-07-16 NOTE — Consult Note (Signed)
[facility]: [facility]   Imported By: De Nurse 06/03/2010 10:46:55  _____________________________________________________________________  External Attachment:    Type:   Image     Comment:   External Document  Appended Document: [facility]    Clinical Lists Changes  Medications: Changed medication from PRAVASTATIN SODIUM 40 MG TABS (PRAVASTATIN SODIUM) 1 tablet by mouth at bedtime for cholesterol - to replace Simvastatin to PRAVASTATIN SODIUM 20 MG TABS (PRAVASTATIN SODIUM) once daily- per cardiology

## 2010-07-16 NOTE — Miscellaneous (Signed)
Summary: A1C  Clinical Lists Changes  Orders: Added new Test order of A1C-[facility] [phone]) - Signed

## 2010-07-16 NOTE — Miscellaneous (Signed)
Summary: CBC, CMP, FLP from [facility]  Clinical Lists Changes  Observations: Added new observation of LDL: 74 mg/dL (16/03/9603 54:09) Added new observation of [name]: 42 mg/dL (81/19/1478 29:56) Added new observation of TRIGLYC TOT: 176 mg/dL (21/30/8657 84:69) Added new observation of CHOLESTEROL: 151 mg/dL (62/95/2841 32:44) Added new observation of PLATELETK/UL: 187 K/uL (05/25/2010 12:34) Added new observation of MCV: 85.9 fL (05/25/2010 12:34) Added new observation of HGB: 10.2 g/dL (06/16/7251 66:44) Added new observation of WBC COUNT: 6.5 10*3/microliter (05/25/2010 12:34) Added new observation of CALCIUM: 9.9 mg/dL (03/47/4259 56:38) Added new observation of ALBUMIN: 4.2 g/dL (75/64/3329 51:88) Added new observation of SGPT (ALT): 15 units/L (05/25/2010 12:34) Added new observation of SGOT (AST): 15 units/L (05/25/2010 12:34) Added new observation of ALK PHOS: 58 units/L (05/25/2010 12:34) Added new observation of CREATININE: 1.79 mg/dL (41/66/0630 16:01) Added new observation of BUN: 27 mg/dL (09/32/3557 32:20) Added new observation of BG RANDOM: 150 mg/dL (25/42/7062 37:62) Added new observation of CO2 PLSM/SER: 26 meq/L (05/25/2010 12:34) Added new observation of CL SERUM: 105 meq/L (05/25/2010 12:34) Added new observation of K SERUM: 4.6 meq/L (05/25/2010 12:34) Added new observation of NA: 140 meq/L (05/25/2010 12:34)

## 2010-07-16 NOTE — Assessment & Plan Note (Signed)
Summary: f/u,df   Vital Signs:  Patient profile:   70 year old female Height:      64 inches Weight:      178 pounds BMI:     30.66 Temp:     98.1 degrees F oral Pulse rate:   82 / minute BP sitting:   119 / 58  (right arm) Cuff size:   regular  Vitals Entered By: Tessie Fass CMA (July 09, 2010 10:13 AM) CC: F/U COPD, DM, kne epain Is Patient Diabetic? Yes Pain Assessment Patient in pain? yes     Location: bilateral leg Intensity: 2   Primary Care Provider:  Delbert Harness MD  CC:  F/U COPD, DM, and kne epain.  History of Present Illness:   Here for [facility]   Congestion- cough with sputum production/ yellow and green, improved, some wheezing still, uses albuterol rarely now (1 time a dy), seen by urgernt care, given Azithromycin x 4 days, Prednisone 60mg  x 3 days, Albuterol MDI no fever, occ chills, no SOB  Right knee pain- flared this weekend, +swelling, walking improved, uses a cane when her knee swells or she has any joint pain or back pain, uses Tylenool and Lidoderm patches as needed. 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) 3)  Anemia, Normocytic, Chronic  (ICD-285.9) 4)  Contusion of Multiple Sites Nec  (ICD-924.8) 5)  Constipation, Chronic  (ICD-564.09) 6)  Conductive Hearing Loss Unilateral  (ICD-389.05) 7)  Neck Pain, Left  (ICD-723.1) 8)  Pruritus  (ICD-698.9) 9)  Cad  (ICD-414.00) 10)  Renal Insufficiency, Chronic  (ICD-586) 11)  Hypertension, Benign Systemic  (ICD-401.1) 12)  Hypercholesterolemia  (ICD-272.0) 13)  Schizoaffective Disorder  (ICD-295.70) 14)  Hypothyroidism, Unspecified  (ICD-244.9) 15)   Obesity, Nos  (ICD-278.00) 16)  Gastroesophageal Reflux, No Esophagitis  (ICD-530.81) 17)  Gout  (ICD-274.9) 18)  Arthritis  (ICD-716.90) 19)  Low Back Pain, Chronic  (ICD-724.2) 20)  Hemorrhoids, Nos  (ICD-455.6) 21)  Abnormality of Gait  (ICD-781.2) 22)  Exostosis of Unspecified Site  (ICD-726.91) 23)  Postmenopausal Status  (ICD-V49.81)  Current Medications (verified): 1)  Aspirin Adult Low Strength 81 Mg Tbec (Aspirin) 2)  Plavix 75 Mg Tabs (Clopidogrel Bisulfate) 3)  Nitroglycerin 0.4 Mg Subl (Nitroglycerin) .... As Needed 4)  Isosorbide Mononitrate Cr 60 Mg  Tb24 (Isosorbide Mononitrate) .... One Tablet Daily 5)  Toprol Xl 100 Mg Xr24h-Tab (Metoprolol Succinate) .Marland Kitchen.. 1 By Mouth Daily For Blood Pressure 6)  Diltiazem Hcl Cr 180 Mg Xr24h-Cap (Diltiazem Hcl) .... Take One Tablet Daily 7)  Pravastatin Sodium 20 Mg Tabs (Pravastatin Sodium) .... Once Daily- Per Cardiology 8)  Clozapine 100 Mg Tabs (Clozapine) .... 3 Tablets By Mouth Daily 9)  Synthroid 50 Mcg Tabs (Levothyroxine Sodium) .... Take 1 Tablet By Mouth Once A Day 10)  Fish Oil 1000 Mg  Caps (Omega-3 Fatty Acids) .... 2 Capsules Daily (Otc) 11)  Pantoprazole Sodium 40 Mg Tbec (Pantoprazole Sodium) .... Take One Tablet Daily 12)  Ventolin Hfa 108 (90 Base) Mcg/act  Aers (Albuterol Sulfate) .Marland Kitchen.. 1-2 Puffs Inh Q4 Hrs As Needed For Shortness of Breath  13)  Tylenol 8 Hour 650 Mg Cr-Tabs (Acetaminophen) .Marland Kitchen.. 1 Tab By Mouth Q 8 Hours As Needed Pain; Use Least Effective Amount 14)  Calcium 600/vitamin D 600-400 Mg-Unit  Tabs (Calcium Carbonate-Vitamin D) 15)  Hydroxyzine Hcl 25 Mg Tabs (Hydroxyzine Hcl) .Marland Kitchen.. 1 Tablet By Mouth Two Times A Day As Needed For Itching 16)  Diabetic Shoes .... Dx:  Diabetes Wtih Exostosis S/p Great Toe Fracture 17)  Triamcinolone Acetonide 0.5 % Oint (Triamcinolone Acetonide) .... Apply To Affected Areas Two Times A Day For No More Than 2 Weeks - Disp 60 Grams 18)  Lidoderm 5 % Ptch (Lidocaine) .... Apply  Up To 3 Patches At A Time.  Use For 12 Hours; Then Remove For 12 Hours. 19)  Miralax  Powd (Polyethylene Glycol 3350) .Marland KitchenMarland KitchenMarland Kitchen 17 G Dissolved in 8 Ozs Fluid Daily As Needed Constipation. 20)  Prednisone 20 Mg Tabs (Prednisone) .... Take 2 Tablets By Mouth Daily X 5 Days  Allergies (verified): No Known Drug Allergies  Past History:  Past Medical History: CAD s/p  RCA stent 01/2005 (Taxus 3.5 mm x 20 mm) - on Plavix (Cardiologist - Dr. Jenne Campus --> Lynnea Ferrier)       - Nuclear stress test 9/09 - Dr. Lynnea Ferrier - normal, EF 67% GFR 40 Stage 3 CKD likely 2/2 DM - baseline 1.4-1.6 Hx of Multiple psych hospitalizations:  schizoaffective do [facility]) - more recently has been stable Lt shoulder X ray 2/08 - Rotator cuff calcifications at insertion site. no acute findings. RUQ U/S-acute acalculas cholecystitis - 05/21/2002 Sleep study no OSA, no RLS - 10/22/2005 Hosp 2/08 Rhabdo, ARF hosp 1/04 part SBO vs ileus and prerenal ARF Chronic diarrhea BMD - calcaneal T = 0.04 - 01/17/2001 COPD  Social History: Smoking Status:  quit  Review of Systems       per HPI   Physical Exam  General:  well appearing, NAD. Vital signs noted  Lungs:  course BS, with expiratory wheeze, normal WOB, no retractions, harsh cough oxygen sat 97%RA  Heart:  RRR, no murmur  Abdomen:  Bowel sounds positive,abdomen soft mild ttp in LLQ, no rebound, no gaurding, bloated appearing  Msk:  moving all 4 ext Right knee- no erythema, no warmth, no effusion ntoed, TTP along inferior joint line, no pain with varus/valgus stress  Neurologic:  external rotation of left leg  with waking, shuffling of feet, no wide based gait noted, left foot lags behind ambulates with cane    Impression & Recommendations:  Problem # 1:  COPD, MILD (ICD-496) Assessment Deteriorated  Per pt and family history of COPD but has not had any difficulty in years, given antibiotics in ER, with exam today, will give a slightly longer burst as she  responded well and also given refill on albuterol. As this was not an active issue for pt, would obtain PFT in 6 weeks, to document function for diagnsosis Her updated medication list for this problem includes:    Ventolin Hfa 108 (90 Base) Mcg/act Aers (Albuterol sulfate) .Marland Kitchen... 1-2 puffs inh q4 hrs as needed for shortness of breath  Orders: [facility]- Est  Level 4 ([address])  Problem # 2:  DIABETES MELLITUS, II, COMPLICATIONS (ICD-250.92) Assessment: Improved Given new script for meter, as her meter was 40points off from ours here in teh clinic, pt states it was 72 years old. Note elevated BS at home were prior to steroid use and did not correspond with A1C. Her updated medication list for this problem includes:  Aspirin Adult Low Strength 81 Mg Tbec (Aspirin)  Orders: Glucose-[facility] ([address]) [facility]- Est  Level 4 ([address])  Labs Reviewed: Creat: 1.79 (05/25/2010)   Microalbumin: 1+ (02/03/2009)  Last Eye Exam: normal (02/09/2008) Reviewed HgBA1c results: 6.2 (07/09/2010)  6.5 (01/27/2010)  Problem # 3:  HYPERTENSION, BENIGN SYSTEMIC (ICD-401.1) Assessment: Unchanged  Her updated medication list for this problem includes:    Toprol Xl 100 Mg Xr24h-tab (Metoprolol succinate) .Marland Kitchen... 1 by mouth daily for blood pressure    Diltiazem Hcl Cr 180 Mg Xr24h-cap (Diltiazem hcl) .Marland Kitchen... Take one tablet daily  Orders: [facility]- Est  Level 4 ([address])  Problem # 4:  ARTHRITIS (ICD-716.90) Assessment: Deteriorated multi site arthritis including knee, pain, currently improving, pt has proper meds at home for pain, injection not needed today  Problem # 5:  ABNORMALITY OF GAIT (ICD-781.2) Assessment: Unchanged  Stable with walker Impaired mobility- gait is slow, though initial up from seated position is adequate and stable Recommended- stretches for strengthening of back, pt is very catious and afraid she will have pain with any new activity  Orders: [facility]- Est  Level 4 ([address])  Complete Medication List: 1)   Aspirin Adult Low Strength 81 Mg Tbec (Aspirin) 2)  Plavix 75 Mg Tabs (Clopidogrel bisulfate) 3)  Nitroglycerin 0.4 Mg Subl (Nitroglycerin) .... As needed 4)  Isosorbide Mononitrate Cr 60 Mg Tb24 (Isosorbide mononitrate) .... One tablet daily 5)  Toprol Xl 100 Mg Xr24h-tab (Metoprolol succinate) .Marland Kitchen.. 1 by mouth daily for blood pressure 6)  Diltiazem Hcl Cr 180 Mg Xr24h-cap (Diltiazem hcl) .... Take one tablet daily 7)  Pravastatin Sodium 20 Mg Tabs (Pravastatin sodium) .... Once daily- per cardiology 8)  Clozapine 100 Mg Tabs (Clozapine) .... 3 tablets by mouth daily 9)  Synthroid 50 Mcg Tabs (Levothyroxine sodium) .... Take 1 tablet by mouth once a day 10)  Fish Oil 1000 Mg Caps (Omega-3 fatty acids) .... 2 capsules daily (otc) 11)  Pantoprazole Sodium 40 Mg Tbec (Pantoprazole sodium) .... Take one tablet daily 12)  Ventolin Hfa 108 (90 Base) Mcg/act Aers (Albuterol sulfate) .Marland Kitchen.. 1-2 puffs inh q4 hrs as needed for shortness of breath 13)  Tylenol 8 Hour 650 Mg Cr-tabs (Acetaminophen) .Marland Kitchen.. 1 tab by mouth q 8 hours as needed pain; use least effective amount 14)  Calcium 600/vitamin D 600-400 Mg-unit Tabs (Calcium carbonate-vitamin d) 15)  Hydroxyzine Hcl 25 Mg Tabs (Hydroxyzine hcl) .Marland Kitchen.. 1 tablet by mouth two times a day as needed for itching 16)  Diabetic Shoes  .... Dx:  diabetes wtih exostosis s/p great toe fracture 17)  Triamcinolone Acetonide 0.5 % Oint (Triamcinolone acetonide) .... Apply to affected areas two times a day for no more than 2 weeks - disp 60 grams 18)  Lidoderm 5 % Ptch (Lidocaine) .... Apply up to 3 patches at a time.  use for 12 hours; then remove for 12 hours. 19)  Miralax Powd (Polyethylene glycol 3350) .Marland KitchenMarland KitchenMarland Kitchen 17 g dissolved in 8 ozs fluid daily as needed constipation. 20)  Prednisone 20 Mg Tabs (Prednisone) .... Take 2 tablets by mouth daily x 5 days  Patient Instructions: 1)  Do your back stretches 2)  Do your squats- 10 a day  3)  Do your marches in your chair 4)   Take the prednisone 40mg  for 5 days 5)  Use the albuterol inhaler  6)  Follow up this Friday if you are not better  7)  Next visit with Dr. Earnest Bailey 1 month  Prescriptions: VENTOLIN HFA 108 (90 BASE)  MCG/ACT  AERS (ALBUTEROL SULFATE) 1-2 puffs inh q4 hrs as needed for shortness of breath  #1 x 6   Entered and Authorized by:   Milinda Antis MD   Signed by:   Milinda Antis MD on 07/09/2010   Method used:   Electronically to        [facility] (retail)       [address]       [place], [place]  [address]       Ph: [phone]       Fax: [phone]   RxID:   [id number] DIABETIC SHOES Dx:  Diabetes wtih Exostosis s/p Great toe fracture  #1 x 0   Entered and Authorized by:   Milinda Antis MD   Signed by:   Milinda Antis MD on 07/09/2010   Method used:   Print then Give to Patient   RxID:   [id number] PREDNISONE 20 MG TABS (PREDNISONE) Take 2 tablets by mouth daily x 5 days  #10 x 0   Entered and Authorized by:   Milinda Antis MD   Signed by:   Milinda Antis MD on 07/09/2010   Method used:   Electronically to        [facility] (retail)       [address]       [place], [place]  [address]       Ph: [phone]       Fax: [phone]   RxID:   [id number]    Orders Added: 1)  Glucose-[facility] [[address]] 2)  [facility]- Est  Level 4 [[address]]    Laboratory Results   Blood Tests   Date/Time Received: July 09, 2010 10:10 AM  Date/Time Reported: July 09, 2010 11:36 AM   HGBA1C: 6.2%   (Normal Range: Non-Diabetic - 3-6%   Control Diabetic - 6-8%)  Comments: ...............test performed by......Marland KitchenBonnie A. [place], MLS (ASCP)cm

## 2010-07-30 NOTE — Miscellaneous (Signed)
Summary: labs from [facility]  Clinical Lists Changes  Observations: Added new observation of LYMPHS %: 14.9 % (07/14/2010 11:40) Added new observation of PMN %: 75 % (07/14/2010 11:40) Added new observation of PLATELETK/UL: 272 K/uL (07/14/2010 11:40) Added new observation of RDW: 13.3 % (07/14/2010 11:40) Added new observation of MCV: 86.3 fL (07/14/2010 11:40) Added new observation of HCT: 31.4 % (07/14/2010 11:40) Added new observation of HGB: 10.2 g/dL (04/54/0981 19:14) Added new observation of WBC COUNT: 20 10*3/microliter (07/14/2010 11:40)  Anemia is chornic.  Elevated WBC.  Was seen in office several days prior with bronchitis. Delbert Harness MD  July 20, 2010 11:42 AM

## 2010-08-06 ENCOUNTER — Ambulatory Visit (INDEPENDENT_AMBULATORY_CARE_PROVIDER_SITE_OTHER): Payer: Medicare Other | Admitting: Family Medicine

## 2010-08-06 ENCOUNTER — Encounter: Payer: Self-pay | Admitting: Family Medicine

## 2010-08-06 VITALS — BP 154/82 | HR 82 | Temp 98.4°F | Wt 183.2 lb

## 2010-08-06 DIAGNOSIS — N19 Unspecified kidney failure: Secondary | ICD-10-CM

## 2010-08-06 DIAGNOSIS — I2510 Atherosclerotic heart disease of native coronary artery without angina pectoris: Secondary | ICD-10-CM

## 2010-08-06 DIAGNOSIS — E7800 Pure hypercholesterolemia, unspecified: Secondary | ICD-10-CM

## 2010-08-06 DIAGNOSIS — E1165 Type 2 diabetes mellitus with hyperglycemia: Secondary | ICD-10-CM

## 2010-08-06 DIAGNOSIS — F259 Schizoaffective disorder, unspecified: Secondary | ICD-10-CM

## 2010-08-06 DIAGNOSIS — I10 Essential (primary) hypertension: Secondary | ICD-10-CM

## 2010-08-06 DIAGNOSIS — J449 Chronic obstructive pulmonary disease, unspecified: Secondary | ICD-10-CM

## 2010-08-06 NOTE — Patient Instructions (Signed)
Follow-up in 3-4 months Feel free to make follow-up with Dr. Gerilyn Pilgrim at the front desk Check blood pressures regularly.  If you notice your blood pressure is consistantly above 130/80, then please make follow-up appt to discuss more intensive treatment. I think that lump in your throat is a lymph node. If it continues to be there or changes, let me know.

## 2010-08-06 NOTE — Assessment & Plan Note (Signed)
Dyspnea resolved after tx with azithro + prednisone.  Has not had any problems in many years, possible only URI.  If recurs, will obtain PFT testing.

## 2010-08-06 NOTE — Assessment & Plan Note (Signed)
Doing very well. At goal. Continue tx with diet, encourages increasing physical activity slowly.  Has been in consultation with nutritionist.

## 2010-08-06 NOTE — Progress Notes (Signed)
  Subjective:    Patient ID: Rebecca [name], female    DOB: 1940-07-21, 70 y.o.   MRN: [id number]  HPI HYPERTENSION  BP Readings from Last 3 Encounters:  08/06/10 154/82  07/09/10 119/58  04/09/10 119/69    Hypertension ROS: taking medications as instructed, no medication side effects noted, medication side effects include: no side effects noted, patient does not perform home BP monitoring, no chest pain on exertion, no dyspnea on exertion, no swelling of ankles and no intermittent claudication symptoms.   DIABETES  Taking and tolerating: yes Fasting blood sugars:7 da 152, 14d 153, 30d 150  Hypoglycemic symptoms: no Visual problems: no Monitoring feet: yes Numbness/Tingling: no Last eye exam: Nov 2011 Diabetic Labs:  Lab Results  Component Value Date   HGBA1C 6.2 07/09/2010   HGBA1C 6.5 01/27/2010   HGBA1C 6.9 09/08/2009   Lab Results  Component Value Date   CREATININE 1.79 05/25/2010   Last microalbumin: No results found for this basename: MICROALBUR, [id number]   Dyspnea:  Resolved after treatment with abx. Has not been suing albuterol.  No dyspnea.  No history of frequent dyspnea.  Right neck sore:  4 days of right sided neck bump.  No fevers, chills, night sweats.  Review of Systems See HPI    Objective:   Physical Exam  Constitutional: She appears well-developed and well-nourished.  HENT:  Mouth/Throat: Oropharynx is clear and moist. No oropharyngeal exudate.  Neck:       Tender right anterior cervical lymph node approx 1 cm diameter, soft, freely mobile.  No other cervical LAD noted.  Cardiovascular: Normal rate and regular rhythm.   No murmur heard. Pulmonary/Chest: Effort normal and breath sounds normal. She has no wheezes.  Abdominal: Soft.  Musculoskeletal: She exhibits no edema.  Lymphadenopathy:    She has cervical adenopathy.          Assessment & Plan:

## 2010-08-06 NOTE — Assessment & Plan Note (Signed)
Elevated today.  Past BP's reviewed and has history of very good control.  Advised to watch diet and salt intake.  She has being seeing nutritionist.  Advised to check home BP's and if continues to be elevated to return to office sooner.  I notice she is not on an ACE-I which would be beneficial to her.  I do not see in records why nephrology, cardiology, or primary care have avoided this.  Will review with patient at next visit.

## 2010-08-06 NOTE — Assessment & Plan Note (Signed)
Has follow-up with Dr. Lynnea Ferrier to assess improvement on restarting pravastatin.

## 2010-08-06 NOTE — Assessment & Plan Note (Signed)
Saw renal recently, no changes.  Cr. Stable.  Baseline 1.7-2.0.

## 2010-08-27 LAB — CBC
HCT: 27.2 % — ABNORMAL LOW (ref 36.0–46.0)
HCT: 27.5 % — ABNORMAL LOW (ref 36.0–46.0)
HCT: 34.3 % — ABNORMAL LOW (ref 36.0–46.0)
MCH: 28.4 pg (ref 26.0–34.0)
MCHC: 33.8 g/dL (ref 30.0–36.0)
MCHC: 34.4 g/dL (ref 30.0–36.0)
MCV: 84 fL (ref 78.0–100.0)
MCV: 84.8 fL (ref 78.0–100.0)
MCV: 84.9 fL (ref 78.0–100.0)
MCV: 85.1 fL (ref 78.0–100.0)
Platelets: 167 10*3/uL (ref 150–400)
Platelets: 184 10*3/uL (ref 150–400)
RBC: 3.24 MIL/uL — ABNORMAL LOW (ref 3.87–5.11)
RBC: 3.48 MIL/uL — ABNORMAL LOW (ref 3.87–5.11)
RDW: 13.1 % (ref 11.5–15.5)
RDW: 13.3 % (ref 11.5–15.5)
RDW: 13.5 % (ref 11.5–15.5)
WBC: 14.8 10*3/uL — ABNORMAL HIGH (ref 4.0–10.5)
WBC: 7.2 10*3/uL (ref 4.0–10.5)
WBC: 7.9 10*3/uL (ref 4.0–10.5)

## 2010-08-27 LAB — BASIC METABOLIC PANEL
Calcium: 8.8 mg/dL (ref 8.4–10.5)
Chloride: 112 mEq/L (ref 96–112)
Chloride: 115 mEq/L — ABNORMAL HIGH (ref 96–112)
Creatinine, Ser: 1.5 mg/dL — ABNORMAL HIGH (ref 0.4–1.2)
Creatinine, Ser: 2 mg/dL — ABNORMAL HIGH (ref 0.4–1.2)
GFR calc Af Amer: 30 mL/min — ABNORMAL LOW (ref >60)
GFR calc Af Amer: 41 mL/min — ABNORMAL LOW (ref >60)
GFR calc non Af Amer: 25 mL/min — ABNORMAL LOW (ref >60)
GFR calc non Af Amer: 34 mL/min — ABNORMAL LOW (ref >60)
Potassium: 4.2 mEq/L (ref 3.5–5.1)

## 2010-08-27 LAB — POCT CARDIAC MARKERS: Troponin i, poc: <0.05 ng/mL (ref 0.00–0.09)

## 2010-08-27 LAB — URINALYSIS, ROUTINE W REFLEX MICROSCOPIC
Ketones, ur: NEGATIVE mg/dL
Leukocytes, UA: NEGATIVE
Nitrite: NEGATIVE
Protein, ur: 30 mg/dL — AB
pH: 5 (ref 5.0–8.0)

## 2010-08-27 LAB — RENAL FUNCTION PANEL
CO2: 24 mEq/L (ref 19–32)
Calcium: 8.3 mg/dL — ABNORMAL LOW (ref 8.4–10.5)
Creatinine, Ser: 4.02 mg/dL — ABNORMAL HIGH (ref 0.4–1.2)
Glucose, Bld: 144 mg/dL — ABNORMAL HIGH (ref 70–99)

## 2010-08-27 LAB — DIFFERENTIAL
Basophils Absolute: 0 10*3/uL (ref 0.0–0.1)
Lymphocytes Relative: 16 % (ref 12–46)
Lymphs Abs: 2.4 10*3/uL (ref 0.7–4.0)
Monocytes Absolute: 1.2 10*3/uL — ABNORMAL HIGH (ref 0.1–1.0)
Monocytes Relative: 8 % (ref 3–12)
Neutro Abs: 11.2 10*3/uL — ABNORMAL HIGH (ref 1.7–7.7)

## 2010-08-27 LAB — GLUCOSE, CAPILLARY
Glucose-Capillary: 112 mg/dL — ABNORMAL HIGH (ref 70–99)
Glucose-Capillary: 130 mg/dL — ABNORMAL HIGH (ref 70–99)
Glucose-Capillary: 150 mg/dL — ABNORMAL HIGH (ref 70–99)
Glucose-Capillary: 169 mg/dL — ABNORMAL HIGH (ref 70–99)
Glucose-Capillary: 173 mg/dL — ABNORMAL HIGH (ref 70–99)
Glucose-Capillary: 92 mg/dL (ref 70–99)

## 2010-08-27 LAB — COMPREHENSIVE METABOLIC PANEL
Alkaline Phosphatase: 57 U/L (ref 39–117)
BUN: 64 mg/dL — ABNORMAL HIGH (ref 6–23)
Glucose, Bld: 157 mg/dL — ABNORMAL HIGH (ref 70–99)
Potassium: 4.7 mEq/L (ref 3.5–5.1)
Total Protein: 7.3 g/dL (ref 6.0–8.3)

## 2010-08-27 LAB — POCT I-STAT, CHEM 8
BUN: 74 mg/dL — ABNORMAL HIGH (ref 6–23)
Calcium, Ion: 0.99 mmol/L — ABNORMAL LOW (ref 1.12–1.32)
Chloride: 100 mEq/L (ref 96–112)
Creatinine, Ser: 5 mg/dL — ABNORMAL HIGH (ref 0.4–1.2)

## 2010-08-27 LAB — URINE CULTURE
Colony Count: NO GROWTH
Culture  Setup Time: 201109012149

## 2010-08-27 LAB — CREATININE, URINE, RANDOM: Creatinine, Urine: 205.6 mg/dL

## 2010-08-27 LAB — URINE MICROSCOPIC-ADD ON

## 2010-09-02 LAB — GLUCOSE, CAPILLARY: Glucose-Capillary: 168 mg/dL — ABNORMAL HIGH (ref 70–99)

## 2010-09-06 LAB — GLUCOSE, CAPILLARY
Glucose-Capillary: 105 mg/dL — ABNORMAL HIGH (ref 70–99)
Glucose-Capillary: 108 mg/dL — ABNORMAL HIGH (ref 70–99)
Glucose-Capillary: 114 mg/dL — ABNORMAL HIGH (ref 70–99)
Glucose-Capillary: 116 mg/dL — ABNORMAL HIGH (ref 70–99)
Glucose-Capillary: 117 mg/dL — ABNORMAL HIGH (ref 70–99)
Glucose-Capillary: 120 mg/dL — ABNORMAL HIGH (ref 70–99)
Glucose-Capillary: 122 mg/dL — ABNORMAL HIGH (ref 70–99)
Glucose-Capillary: 123 mg/dL — ABNORMAL HIGH (ref 70–99)
Glucose-Capillary: 124 mg/dL — ABNORMAL HIGH (ref 70–99)
Glucose-Capillary: 125 mg/dL — ABNORMAL HIGH (ref 70–99)
Glucose-Capillary: 126 mg/dL — ABNORMAL HIGH (ref 70–99)
Glucose-Capillary: 130 mg/dL — ABNORMAL HIGH (ref 70–99)
Glucose-Capillary: 131 mg/dL — ABNORMAL HIGH (ref 70–99)
Glucose-Capillary: 131 mg/dL — ABNORMAL HIGH (ref 70–99)
Glucose-Capillary: 132 mg/dL — ABNORMAL HIGH (ref 70–99)
Glucose-Capillary: 138 mg/dL — ABNORMAL HIGH (ref 70–99)
Glucose-Capillary: 143 mg/dL — ABNORMAL HIGH (ref 70–99)
Glucose-Capillary: 143 mg/dL — ABNORMAL HIGH (ref 70–99)
Glucose-Capillary: 145 mg/dL — ABNORMAL HIGH (ref 70–99)
Glucose-Capillary: 147 mg/dL — ABNORMAL HIGH (ref 70–99)
Glucose-Capillary: 165 mg/dL — ABNORMAL HIGH (ref 70–99)
Glucose-Capillary: 172 mg/dL — ABNORMAL HIGH (ref 70–99)
Glucose-Capillary: 178 mg/dL — ABNORMAL HIGH (ref 70–99)
Glucose-Capillary: 182 mg/dL — ABNORMAL HIGH (ref 70–99)
Glucose-Capillary: 186 mg/dL — ABNORMAL HIGH (ref 70–99)

## 2010-09-06 LAB — STOOL CULTURE

## 2010-09-06 LAB — BASIC METABOLIC PANEL WITH GFR
BUN: 71 mg/dL — ABNORMAL HIGH (ref 6–23)
CO2: 22 meq/L (ref 19–32)
Chloride: 96 meq/L (ref 96–112)
Creatinine, Ser: 5.01 mg/dL — ABNORMAL HIGH (ref 0.4–1.2)
Glucose, Bld: 191 mg/dL — ABNORMAL HIGH (ref 70–99)
Potassium: 3.8 meq/L (ref 3.5–5.1)

## 2010-09-06 LAB — URINE MICROSCOPIC-ADD ON

## 2010-09-06 LAB — COMPREHENSIVE METABOLIC PANEL
ALT: 16 U/L (ref 0–35)
Albumin: 3.2 g/dL — ABNORMAL LOW (ref 3.5–5.2)
Alkaline Phosphatase: 54 U/L (ref 39–117)
Calcium: 7.5 mg/dL — ABNORMAL LOW (ref 8.4–10.5)
GFR calc Af Amer: 18 mL/min — ABNORMAL LOW (ref >60)
Potassium: 3.3 mEq/L — ABNORMAL LOW (ref 3.5–5.1)
Sodium: 132 mEq/L — ABNORMAL LOW (ref 135–145)
Total Protein: 5.5 g/dL — ABNORMAL LOW (ref 6.0–8.3)

## 2010-09-06 LAB — CBC
HCT: 26 % — ABNORMAL LOW (ref 36.0–46.0)
HCT: 27.1 % — ABNORMAL LOW (ref 36.0–46.0)
HCT: 31.3 % — ABNORMAL LOW (ref 36.0–46.0)
Hemoglobin: 10.6 g/dL — ABNORMAL LOW (ref 12.0–15.0)
Hemoglobin: 9.1 g/dL — ABNORMAL LOW (ref 12.0–15.0)
Hemoglobin: 9.4 g/dL — ABNORMAL LOW (ref 12.0–15.0)
MCHC: 33.8 g/dL (ref 30.0–36.0)
MCV: 86.9 fL (ref 78.0–100.0)
MCV: 87.1 fL (ref 78.0–100.0)
MCV: 87.6 fL (ref 78.0–100.0)
Platelets: 168 10*3/uL (ref 150–400)
Platelets: 179 10*3/uL (ref 150–400)
Platelets: 217 K/uL (ref 150–400)
RBC: 2.97 MIL/uL — ABNORMAL LOW (ref 3.87–5.11)
RBC: 3.06 MIL/uL — ABNORMAL LOW (ref 3.87–5.11)
RBC: 3.09 MIL/uL — ABNORMAL LOW (ref 3.87–5.11)
RBC: 3.59 MIL/uL — ABNORMAL LOW (ref 3.87–5.11)
RDW: 13.8 % (ref 11.5–15.5)
RDW: 13.8 % (ref 11.5–15.5)
WBC: 13.3 K/uL — ABNORMAL HIGH (ref 4.0–10.5)
WBC: 6.1 10*3/uL (ref 4.0–10.5)
WBC: 6.3 10*3/uL (ref 4.0–10.5)
WBC: 8.7 10*3/uL (ref 4.0–10.5)
WBC: 8.8 10*3/uL (ref 4.0–10.5)

## 2010-09-06 LAB — CLOSTRIDIUM DIFFICILE EIA
C difficile Toxins A+B, EIA: NEGATIVE
C difficile Toxins A+B, EIA: NEGATIVE

## 2010-09-06 LAB — BASIC METABOLIC PANEL
BUN: 30 mg/dL — ABNORMAL HIGH (ref 6–23)
BUN: 35 mg/dL — ABNORMAL HIGH (ref 6–23)
Calcium: 8.4 mg/dL (ref 8.4–10.5)
Calcium: 8.7 mg/dL (ref 8.4–10.5)
Calcium: 9.5 mg/dL (ref 8.4–10.5)
Chloride: 111 mEq/L (ref 96–112)
Chloride: 116 mEq/L — ABNORMAL HIGH (ref 96–112)
Creatinine, Ser: 1.82 mg/dL — ABNORMAL HIGH (ref 0.4–1.2)
Creatinine, Ser: 1.9 mg/dL — ABNORMAL HIGH (ref 0.4–1.2)
GFR calc Af Amer: 10 mL/min — ABNORMAL LOW (ref >60)
GFR calc Af Amer: 32 mL/min — ABNORMAL LOW (ref >60)
GFR calc Af Amer: 33 mL/min — ABNORMAL LOW (ref >60)
GFR calc Af Amer: 36 mL/min — ABNORMAL LOW (ref >60)
GFR calc Af Amer: 39 mL/min — ABNORMAL LOW (ref >60)
GFR calc non Af Amer: 26 mL/min — ABNORMAL LOW (ref >60)
GFR calc non Af Amer: 27 mL/min — ABNORMAL LOW (ref >60)
GFR calc non Af Amer: 30 mL/min — ABNORMAL LOW (ref >60)
GFR calc non Af Amer: 33 mL/min — ABNORMAL LOW (ref >60)
GFR calc non Af Amer: 9 mL/min — ABNORMAL LOW (ref >60)
Glucose, Bld: 126 mg/dL — ABNORMAL HIGH (ref 70–99)
Potassium: 4.4 mEq/L (ref 3.5–5.1)
Potassium: 4.5 mEq/L (ref 3.5–5.1)
Potassium: 4.9 mEq/L (ref 3.5–5.1)
Sodium: 131 mEq/L — ABNORMAL LOW (ref 135–145)
Sodium: 138 mEq/L (ref 135–145)
Sodium: 141 mEq/L (ref 135–145)
Sodium: 143 mEq/L (ref 135–145)

## 2010-09-06 LAB — URINALYSIS, ROUTINE W REFLEX MICROSCOPIC
Glucose, UA: NEGATIVE mg/dL
Hgb urine dipstick: NEGATIVE
Ketones, ur: 15 mg/dL — AB
Nitrite: NEGATIVE
Protein, ur: NEGATIVE mg/dL
Specific Gravity, Urine: 1.02 (ref 1.005–1.030)
Urobilinogen, UA: 0.2 mg/dL (ref 0.0–1.0)
pH: 5 (ref 5.0–8.0)

## 2010-09-06 LAB — DIFFERENTIAL
Basophils Absolute: 0 10*3/uL (ref 0.0–0.1)
Basophils Relative: 0 % (ref 0–1)
Eosinophils Absolute: 0 K/uL (ref 0.0–0.7)
Eosinophils Relative: 0 % (ref 0–5)
Lymphocytes Relative: 18 % (ref 12–46)
Lymphs Abs: 2.4 K/uL (ref 0.7–4.0)
Monocytes Absolute: 0.9 10*3/uL (ref 0.1–1.0)
Monocytes Relative: 7 % (ref 3–12)
Neutro Abs: 9.9 10*3/uL — ABNORMAL HIGH (ref 1.7–7.7)
Neutrophils Relative %: 75 % (ref 43–77)

## 2010-09-06 LAB — RENAL FUNCTION PANEL
Albumin: 3.5 g/dL (ref 3.5–5.2)
CO2: 21 mEq/L (ref 19–32)
Calcium: 8 mg/dL — ABNORMAL LOW (ref 8.4–10.5)
Chloride: 94 mEq/L — ABNORMAL LOW (ref 96–112)
GFR calc Af Amer: 11 mL/min — ABNORMAL LOW (ref >60)
GFR calc non Af Amer: 9 mL/min — ABNORMAL LOW (ref >60)
Sodium: 130 mEq/L — ABNORMAL LOW (ref 135–145)

## 2010-09-06 LAB — SODIUM, URINE, RANDOM: Sodium, Ur: <10 mEq/L

## 2010-09-06 LAB — POCT URINALYSIS DIP (DEVICE)
Hgb urine dipstick: NEGATIVE
Ketones, ur: 15 mg/dL — AB
Protein, ur: 100 mg/dL — AB
Specific Gravity, Urine: >=1.03 (ref 1.005–1.030)

## 2010-09-06 LAB — URINE CULTURE
Colony Count: NO GROWTH
Culture: NO GROWTH

## 2010-09-06 LAB — FECAL LACTOFERRIN, QUANT

## 2010-09-06 LAB — HEMOCCULT GUIAC POC 1CARD (OFFICE): Fecal Occult Bld: NEGATIVE

## 2010-09-06 LAB — CK: Total CK: 391 U/L — ABNORMAL HIGH (ref 7–177)

## 2010-09-08 ENCOUNTER — Other Ambulatory Visit: Payer: Self-pay | Admitting: Family Medicine

## 2010-09-08 NOTE — Telephone Encounter (Signed)
Refill request

## 2010-09-09 ENCOUNTER — Other Ambulatory Visit: Payer: Self-pay | Admitting: Family Medicine

## 2010-09-09 MED ORDER — DILTIAZEM HCL 180 MG PO CP24: 180.0000 mg | ORAL_CAPSULE | Freq: Every day | ORAL | Status: DC

## 2010-09-10 ENCOUNTER — Inpatient Hospital Stay (HOSPITAL_COMMUNITY)
Admission: EM | Admit: 2010-09-10 | Discharge: 2010-09-17 | DRG: 389 | Disposition: A | Discharge status: Skilled Nursing Facility | Payer: Medicare Other | Attending: Family Medicine | Admitting: Family Medicine

## 2010-09-10 ENCOUNTER — Emergency Department (HOSPITAL_COMMUNITY): Payer: Medicare Other

## 2010-09-10 ENCOUNTER — Encounter (HOSPITAL_COMMUNITY): Payer: Self-pay | Admitting: Radiology

## 2010-09-10 DIAGNOSIS — I129 Hypertensive chronic kidney disease with stage 1 through stage 4 chronic kidney disease, or unspecified chronic kidney disease: Secondary | ICD-10-CM | POA: Diagnosis present

## 2010-09-10 DIAGNOSIS — M542 Cervicalgia: Secondary | ICD-10-CM | POA: Diagnosis present

## 2010-09-10 DIAGNOSIS — M109 Gout, unspecified: Secondary | ICD-10-CM | POA: Diagnosis present

## 2010-09-10 DIAGNOSIS — F259 Schizoaffective disorder, unspecified: Secondary | ICD-10-CM | POA: Diagnosis present

## 2010-09-10 DIAGNOSIS — K565 Intestinal adhesions [bands], unspecified as to partial versus complete obstruction: Principal | ICD-10-CM | POA: Diagnosis present

## 2010-09-10 DIAGNOSIS — N189 Chronic kidney disease, unspecified: Secondary | ICD-10-CM | POA: Diagnosis present

## 2010-09-10 DIAGNOSIS — E669 Obesity, unspecified: Secondary | ICD-10-CM | POA: Diagnosis present

## 2010-09-10 DIAGNOSIS — I509 Heart failure, unspecified: Secondary | ICD-10-CM | POA: Diagnosis present

## 2010-09-10 DIAGNOSIS — G8929 Other chronic pain: Secondary | ICD-10-CM | POA: Diagnosis present

## 2010-09-10 DIAGNOSIS — Z87891 Personal history of nicotine dependence: Secondary | ICD-10-CM

## 2010-09-10 DIAGNOSIS — E785 Hyperlipidemia, unspecified: Secondary | ICD-10-CM | POA: Diagnosis present

## 2010-09-10 DIAGNOSIS — K5900 Constipation, unspecified: Secondary | ICD-10-CM | POA: Diagnosis present

## 2010-09-10 DIAGNOSIS — E119 Type 2 diabetes mellitus without complications: Secondary | ICD-10-CM | POA: Diagnosis present

## 2010-09-10 DIAGNOSIS — D638 Anemia in other chronic diseases classified elsewhere: Secondary | ICD-10-CM | POA: Diagnosis present

## 2010-09-10 DIAGNOSIS — Z7982 Long term (current) use of aspirin: Secondary | ICD-10-CM

## 2010-09-10 DIAGNOSIS — E039 Hypothyroidism, unspecified: Secondary | ICD-10-CM | POA: Diagnosis present

## 2010-09-10 DIAGNOSIS — K219 Gastro-esophageal reflux disease without esophagitis: Secondary | ICD-10-CM | POA: Diagnosis present

## 2010-09-10 DIAGNOSIS — N179 Acute kidney failure, unspecified: Secondary | ICD-10-CM | POA: Diagnosis present

## 2010-09-10 DIAGNOSIS — I2510 Atherosclerotic heart disease of native coronary artery without angina pectoris: Secondary | ICD-10-CM | POA: Diagnosis present

## 2010-09-10 HISTORY — DX: Disorder of kidney and ureter, unspecified: N28.9

## 2010-09-10 LAB — DIFFERENTIAL
Basophils Absolute: 0 10*3/uL (ref 0.0–0.1)
Lymphocytes Relative: 16 % (ref 12–46)
Lymphs Abs: 1.5 10*3/uL (ref 0.7–4.0)
Monocytes Absolute: 0.3 10*3/uL (ref 0.1–1.0)
Neutro Abs: 7.7 10*3/uL (ref 1.7–7.7)

## 2010-09-10 LAB — BASIC METABOLIC PANEL
BUN: 43 mg/dL — ABNORMAL HIGH (ref 6–23)
CO2: 26 mEq/L (ref 19–32)
Glucose, Bld: 159 mg/dL — ABNORMAL HIGH (ref 70–99)
Potassium: 5 mEq/L (ref 3.5–5.1)
Sodium: 133 mEq/L — ABNORMAL LOW (ref 135–145)

## 2010-09-10 LAB — HEPATIC FUNCTION PANEL
Albumin: 4.3 g/dL (ref 3.5–5.2)
Total Bilirubin: 0.4 mg/dL (ref 0.3–1.2)
Total Protein: 7.3 g/dL (ref 6.0–8.3)

## 2010-09-10 LAB — URINALYSIS, ROUTINE W REFLEX MICROSCOPIC
Bilirubin Urine: NEGATIVE
Glucose, UA: NEGATIVE mg/dL
Hgb urine dipstick: NEGATIVE
Nitrite: NEGATIVE
Specific Gravity, Urine: 1.014 (ref 1.005–1.030)
pH: 5.5 (ref 5.0–8.0)

## 2010-09-10 LAB — URINE MICROSCOPIC-ADD ON

## 2010-09-10 LAB — CBC
HCT: 32.7 % — ABNORMAL LOW (ref 36.0–46.0)
Hemoglobin: 11 g/dL — ABNORMAL LOW (ref 12.0–15.0)
MCV: 83.6 fL (ref 78.0–100.0)
WBC: 9.6 10*3/uL (ref 4.0–10.5)

## 2010-09-11 ENCOUNTER — Inpatient Hospital Stay (HOSPITAL_COMMUNITY): Payer: Medicare Other

## 2010-09-11 ENCOUNTER — Encounter: Payer: Self-pay | Admitting: Family Medicine

## 2010-09-11 DIAGNOSIS — E118 Type 2 diabetes mellitus with unspecified complications: Secondary | ICD-10-CM

## 2010-09-11 DIAGNOSIS — N179 Acute kidney failure, unspecified: Secondary | ICD-10-CM

## 2010-09-11 DIAGNOSIS — F258 Other schizoaffective disorders: Secondary | ICD-10-CM

## 2010-09-11 DIAGNOSIS — K56609 Unspecified intestinal obstruction, unspecified as to partial versus complete obstruction: Secondary | ICD-10-CM

## 2010-09-11 LAB — COMPREHENSIVE METABOLIC PANEL
ALT: 35 U/L (ref 0–35)
Albumin: 3.9 g/dL (ref 3.5–5.2)
Alkaline Phosphatase: 53 U/L (ref 39–117)
Glucose, Bld: 155 mg/dL — ABNORMAL HIGH (ref 70–99)
Potassium: 5 mEq/L (ref 3.5–5.1)
Sodium: 135 mEq/L (ref 135–145)
Total Protein: 6.9 g/dL (ref 6.0–8.3)

## 2010-09-11 LAB — AMMONIA: Ammonia: 25 umol/L (ref 11–35)

## 2010-09-11 LAB — CBC
HCT: 31.5 % — ABNORMAL LOW (ref 36.0–46.0)
RBC: 3.8 MIL/uL — ABNORMAL LOW (ref 3.87–5.11)
RDW: 13.5 % (ref 11.5–15.5)
WBC: 12.2 10*3/uL — ABNORMAL HIGH (ref 4.0–10.5)

## 2010-09-11 LAB — BLOOD GAS, ARTERIAL
Drawn by: 22563
pCO2 arterial: 43 mmHg (ref 35.0–45.0)
pH, Arterial: 7.374 (ref 7.350–7.400)

## 2010-09-11 LAB — URINE CULTURE

## 2010-09-11 LAB — GLUCOSE, CAPILLARY: Glucose-Capillary: 159 mg/dL — ABNORMAL HIGH (ref 70–99)

## 2010-09-11 LAB — LIPASE, BLOOD: Lipase: 21 U/L (ref 11–59)

## 2010-09-11 LAB — LACTIC ACID, PLASMA: Lactic Acid, Venous: 2.2 mmol/L (ref 0.5–2.2)

## 2010-09-11 NOTE — H&P (Signed)
Family Medicine [facility] Admission History and Physical  Patient name: Rebecca [name] Medical record number: [id number] Date of birth: 01-Jul-1940 Age: 70 y.o. Gender: female  Primary Care Provider: Delbert Harness, MD  Chief Complaint: abdominal pain History of Present Illness: Rebecca [name] is a 70 y.o. year old female presenting with history SBO presenting with one day history of abdominal pain nausea and vomiting after a meal.  Pt states up to this meal she was feeling fine.  Pt states abdominal pain is generalized non focal constant.  Pt denies any diarrhea or constipation of late out of the norm, no fever, chills, shortness of breath, dyspnea on exertion dysuria, but does state she is not making quite as much urine as before.   Patient Active Problem List  Diagnoses  . HYPOTHYROIDISM, UNSPECIFIED  . DIABETES MELLITUS, II, COMPLICATIONS  . HYPERCHOLESTEROLEMIA  . GOUT  . OBESITY, NOS  . ANEMIA, NORMOCYTIC, CHRONIC  . SCHIZOAFFECTIVE DISORDER  . CONDUCTIVE HEARING LOSS UNILATERAL  . HYPERTENSION, BENIGN SYSTEMIC  . CAD  . HEMORRHOIDS, NOS  . GASTROESOPHAGEAL REFLUX, NO ESOPHAGITIS  . CONSTIPATION, CHRONIC  . RENAL INSUFFICIENCY, CHRONIC  . PRURITUS  . ARTHRITIS  . NECK PAIN, LEFT  . LOW BACK PAIN, CHRONIC  . EXOSTOSIS OF UNSPECIFIED SITE  . LEG CRAMPS  . ABNORMALITY OF GAIT  . CONTUSION OF MULTIPLE SITES NEC  . POSTMENOPAUSAL STATUS  . COPD, MILD   Past Medical History: Past Medical History  Diagnosis Date  . CHF (congestive heart failure)   . Diabetes mellitus   . Renal insufficiency   . S/P laparoscopic cholecystectomy     Past Surgical History: Past Surgical History  Procedure Date  . Partial hysterectomy 1995    Social History: History   Social History  . Marital Status: Divorced    Spouse Name: N/A    Number of Children: N/A  . Years of Education: N/A   Social History Main Topics  . Smoking status: Former Smoker -- 1.0 packs/day for 35  years    Quit date: 04/14/2004  . Smokeless tobacco: Not on file  . Alcohol Use: Not on file  . Drug Use: No  . Sexually Active: Not on file   Other Topics Concern  . Not on file   Social History Narrative   Daughter Rebecca [name] and son are very involved.  Rebecca [name] comes to most office visits.    Family History:   Daughter with stomach problems.  Father died at age 103   with heart disease.  Mom died in 35s of heart failure and diabetes.   Allergies: No Known Allergies  Current Outpatient Prescriptions  Medication Sig Dispense Refill  . acetaminophen (TYLENOL 8 HOUR) 650 MG CR tablet Take 650 mg by mouth every 8 (eight) hours as needed.        Marland Kitchen albuterol (VENTOLIN HFA) 108 (90 BASE) MCG/ACT inhaler Inhale 1-2 puffs into the lungs every 4 (four) hours as needed.        Marland Kitchen aspirin 81 MG EC tablet Take 81 mg by mouth daily.        . Calcium Carbonate-Vitamin D (CALCIUM 600+D) 600-400 MG-UNIT per tablet Take 1 tablet by mouth daily.        . clopidogrel (PLAVIX) 75 MG tablet Take 75 mg by mouth daily.        . cloZAPine (CLOZARIL) 100 MG tablet Take 3 tablets by mouth daily       . diltiazem (DILACOR XR) 180 MG  24 hr capsule Take 1 capsule (180 mg total) by mouth daily.  30 capsule  5  . hydrOXYzine (ATARAX) 25 MG tablet Take 25 mg by mouth 2 (two) times daily. As needed for itching       . isosorbide mononitrate (IMDUR) 60 MG 24 hr tablet TAKE ONE TABLET (60mg ) EVERY DAY  30 tablet  11  . levothyroxine (SYNTHROID, LEVOTHROID) 50 MCG tablet Take 50 mcg by mouth daily.        Marland Kitchen lidocaine (LIDODERM) 5 % Place 3 patches onto the skin. Remove & Discard patch within 12 hours or as directed by MD       . metoprolol (TOPROL-XL) 50 MG 24 hr tablet Take 100 mg by mouth daily.       . nitroGLYCERIN (NITROSTAT) 0.4 MG SL tablet Place 0.4 mg under the tongue as needed.        . Omega-3 Fatty Acids (FISH OIL) 1000 MG CAPS Take 2 capsules by mouth daily.        . pantoprazole (PROTONIX) 40 MG tablet Take  40 mg by mouth daily.        . polyethylene glycol (MIRALAX) powder Take 17 g by mouth daily. as needed for constipation       . pravastatin (PRAVACHOL) 20 MG tablet Take 20 mg by mouth daily. Per Cardiology       . triamcinolone (KENALOG) 0.5 % ointment Apply to affected areas two times a day for no more than 2 weeks       . TRUETEST TEST test strip TEST BLOOD GLUCOSE ONCE A DAY  35 each  11   Review Of Systems: Per HPI with the following additions: Otherwise 12 point review of systems was performed and was unremarkable.  Physical Exam: Pulse: 74  Blood Pressure: 109/55 RR: 20   O2: 95 on ra Temp: 98.2  General: alert, cooperative and appears stated age HEENT: PERRLA and extra ocular movement intact Heart: S1, S2 normal, no murmur, rub or gallop, regular rate and rhythm Lungs: clear to auscultation, no wheezes or rales and unlabored breathing Abdomen: abdominal guarding is present, no CVA tenderness, no hernias noted Extremities: extremities normal, atraumatic, no cyanosis or edema Skin:no rashes Neurology: normal without focal findings and PERLA  Labs and Imaging: Lab Results  Component Value Date/Time   NA 133* 09/10/2010  7:47 PM   K 5.0 09/10/2010  7:47 PM   CL 97 09/10/2010  7:47 PM   CO2 26 09/10/2010  7:47 PM   BUN 43* 09/10/2010  7:47 PM   CREATININE 2.28* 09/10/2010  7:47 PM   GLUCOSE 159* 09/10/2010  7:47 PM   Lab Results  Component Value Date   WBC 9.6 09/10/2010   HGB 11.0* 09/10/2010   HCT 32.7* 09/10/2010   MCV 83.6 09/10/2010   PLT 201 09/10/2010     Abd series: Dilated small bowel loops out of proportion to the amount of  colonic gas, suspicious for small bowel obstruction.   Mild enlargement of cardiac silhouette.  Assessment and Plan: Rebecca [name] is a 70 y.o. year old female presenting with abdominal pain and found SBO 1. SBO-  Pt has NG tube placed now will rest bowels, will get surgical evaluation in the AM. Will monitor with serial abdominal exam, KUB in AM,  will give D5 in fluids until able to eat. Will consider surgical consult in AM.  2.  Acute on CKD-  Likely due to dehydration and near baseline, will continue  IVF and see if improvement in Am.  3.  DM-  Will start ISS while here.  4.  HTN-  Metoprolol 5mg  IV Q 6 while NPO until taking PO 5.  Hypothyroid-  Start synthroid 25 IV then transition to PO when taking PO 6. HLD-  Hold statin until taking PO 7.  Pain control-  Will give morphine prn 8.  FEN/GI as above IVF at 150 with D5 9.  PPX-  Heparin and ppi 10.  Dispo-  Pending clinical improvement.

## 2010-09-12 ENCOUNTER — Inpatient Hospital Stay (HOSPITAL_COMMUNITY): Payer: Medicare Other

## 2010-09-12 LAB — CBC
HCT: 28.7 % — ABNORMAL LOW (ref 36.0–46.0)
MCHC: 33.4 g/dL (ref 30.0–36.0)
MCV: 85.7 fL (ref 78.0–100.0)
Platelets: 170 10*3/uL (ref 150–400)
RDW: 13.8 % (ref 11.5–15.5)

## 2010-09-12 LAB — GLUCOSE, CAPILLARY
Glucose-Capillary: 175 mg/dL — ABNORMAL HIGH (ref 70–99)
Glucose-Capillary: 145 mg/dL — ABNORMAL HIGH (ref 70–99)
Glucose-Capillary: 154 mg/dL — ABNORMAL HIGH (ref 70–99)
Glucose-Capillary: 143 mg/dL — ABNORMAL HIGH (ref 70–99)
Glucose-Capillary: 161 mg/dL — ABNORMAL HIGH (ref 70–99)

## 2010-09-12 LAB — COMPREHENSIVE METABOLIC PANEL
BUN: 42 mg/dL — ABNORMAL HIGH (ref 6–23)
Calcium: 8.2 mg/dL — ABNORMAL LOW (ref 8.4–10.5)
Creatinine, Ser: 2.03 mg/dL — ABNORMAL HIGH (ref 0.4–1.2)
Glucose, Bld: 169 mg/dL — ABNORMAL HIGH (ref 70–99)
Total Protein: 5.4 g/dL — ABNORMAL LOW (ref 6.0–8.3)

## 2010-09-13 LAB — CBC
MCH: 27.9 pg (ref 26.0–34.0)
MCHC: 32.7 g/dL (ref 30.0–36.0)
MCV: 85.3 fL (ref 78.0–100.0)
Platelets: 181 10*3/uL (ref 150–400)
RBC: 3.19 MIL/uL — ABNORMAL LOW (ref 3.87–5.11)
RDW: 13.4 % (ref 11.5–15.5)

## 2010-09-13 LAB — BASIC METABOLIC PANEL
BUN: 17 mg/dL (ref 6–23)
Calcium: 8.2 mg/dL — ABNORMAL LOW (ref 8.4–10.5)
Chloride: 110 mEq/L (ref 96–112)
Creatinine, Ser: 1.34 mg/dL — ABNORMAL HIGH (ref 0.4–1.2)

## 2010-09-13 LAB — GLUCOSE, CAPILLARY
Glucose-Capillary: 151 mg/dL — ABNORMAL HIGH (ref 70–99)
Glucose-Capillary: 161 mg/dL — ABNORMAL HIGH (ref 70–99)

## 2010-09-14 LAB — COMPREHENSIVE METABOLIC PANEL
ALT: 21 U/L (ref 0–35)
AST: 19 U/L (ref 0–37)
Calcium: 7.7 mg/dL — ABNORMAL LOW (ref 8.4–10.5)
GFR calc Af Amer: 52 mL/min — ABNORMAL LOW (ref >60)
Sodium: 137 mEq/L (ref 135–145)
Total Protein: 4.9 g/dL — ABNORMAL LOW (ref 6.0–8.3)

## 2010-09-14 LAB — CBC
Hemoglobin: 8.2 g/dL — ABNORMAL LOW (ref 12.0–15.0)
MCHC: 32.8 g/dL (ref 30.0–36.0)
RDW: 13.5 % (ref 11.5–15.5)

## 2010-09-14 LAB — GLUCOSE, CAPILLARY
Glucose-Capillary: 147 mg/dL — ABNORMAL HIGH (ref 70–99)
Glucose-Capillary: 151 mg/dL — ABNORMAL HIGH (ref 70–99)
Glucose-Capillary: 184 mg/dL — ABNORMAL HIGH (ref 70–99)

## 2010-09-15 ENCOUNTER — Encounter: Payer: Self-pay | Admitting: Home Health Services

## 2010-09-15 LAB — GLUCOSE, CAPILLARY
Glucose-Capillary: 149 mg/dL — ABNORMAL HIGH (ref 70–99)
Glucose-Capillary: 177 mg/dL — ABNORMAL HIGH (ref 70–99)
Glucose-Capillary: 145 mg/dL — ABNORMAL HIGH (ref 70–99)

## 2010-09-15 LAB — CBC
Hemoglobin: 7.9 g/dL — ABNORMAL LOW (ref 12.0–15.0)
RBC: 2.84 MIL/uL — ABNORMAL LOW (ref 3.87–5.11)
WBC: 8.8 10*3/uL (ref 4.0–10.5)

## 2010-09-15 LAB — IRON AND TIBC
Iron: 15 ug/dL — ABNORMAL LOW (ref 42–135)
UIBC: 176 ug/dL

## 2010-09-15 LAB — RETICULOCYTES
Retic Count, Absolute: 36.7 10*3/uL (ref 19.0–186.0)
Retic Ct Pct: 1.3 % (ref 0.4–3.1)

## 2010-09-15 LAB — FOLATE: Folate: 16.9 ng/mL

## 2010-09-15 LAB — BASIC METABOLIC PANEL
CO2: 24 mEq/L (ref 19–32)
Calcium: 8 mg/dL — ABNORMAL LOW (ref 8.4–10.5)
Chloride: 107 mEq/L (ref 96–112)
GFR calc Af Amer: 49 mL/min — ABNORMAL LOW (ref >60)
Potassium: 4.1 mEq/L (ref 3.5–5.1)
Sodium: 137 mEq/L (ref 135–145)

## 2010-09-16 LAB — CBC
Hemoglobin: 8.1 g/dL — ABNORMAL LOW (ref 12.0–15.0)
MCHC: 33.9 g/dL (ref 30.0–36.0)
RDW: 13.3 % (ref 11.5–15.5)

## 2010-09-16 LAB — BASIC METABOLIC PANEL
CO2: 26 mEq/L (ref 19–32)
Calcium: 8.6 mg/dL (ref 8.4–10.5)
GFR calc Af Amer: 41 mL/min — ABNORMAL LOW (ref >60)
GFR calc non Af Amer: 34 mL/min — ABNORMAL LOW (ref >60)
Sodium: 141 mEq/L (ref 135–145)

## 2010-09-16 LAB — GLUCOSE, CAPILLARY
Glucose-Capillary: 147 mg/dL — ABNORMAL HIGH (ref 70–99)
Glucose-Capillary: 161 mg/dL — ABNORMAL HIGH (ref 70–99)
Glucose-Capillary: 119 mg/dL — ABNORMAL HIGH (ref 70–99)

## 2010-09-28 NOTE — Discharge Summary (Signed)
NAME:  Rebecca [name], Rebecca [name]                  ACCOUNT NO.:  [id number]  MEDICAL RECORD NO.:  [id number]           PATIENT TYPE:  I  LOCATION:  3730                         FACILITY:  [facility]  PHYSICIAN:  Leighton Roach McDiarmid, M.D.DATE OF BIRTH:  [birth year]  DATE OF ADMISSION:  09/10/2010 DATE OF DISCHARGE:  09/17/2010                              DISCHARGE SUMMARY   PRIMARY CARE PROVIDER:  Delbert Harness, MD at [facility]  DISCHARGE DIAGNOSES: 1. Partial small bowel obstruction, resolved. 2. Diet-controlled diabetes. 3. Schizoaffective disorder. 4. Coronary artery disease. 5. Hyperlipidemia. 6. Hypothyroidism. 7. Hypertension. 8. Chronic pain. 9. Anemia of chronic disease. 10.Gout. 11.Gastroesophageal reflux disease. 12.Chronic constipation. 13.Chronic renal insufficiency.  DISCHARGE MEDICATIONS:  No new medications were added during this hospitalization. 1. Tylenol 650 mg p.o. four times a day. 2. Dulcolax 10 mg p.o. daily. 3. Lidoderm 5% patch three patches topically q.24 h. as needed. 4. Nitroglycerin 0.4 sublingual tabs q.5 minutes as needed up to three     doses. 5. MiraLax 1 tablet p.o. daily. 6. Aspirin 81 mg p.o. daily. 7. Calcium carbonate and vitamin D 1 tablet p.o. daily. 8. Clozapine 300 mg p.o. at bedtime. 9. Diltiazem CD/XT 180 mg p.o. daily. 10.Fish oil b.i.d. 11.Hydroxyzine 25 mg p.o. b.i.d. p.r.n. itching. 12.Imdur 60 mg p.o. daily. 13.Toprol 100 mg p.o. daily. 14.Multivitamin daily. 15.Protonix 40 mg p.o. daily. 16.Plavix 75 mg p.o. daily. 17.Pravachol 40 mg p.o. daily. 18.Synthroid 50 mcg p.o. daily before breakfast.  CONSULTS:  General Surgery.  PROCEDURES:  Abdominal x-ray done on September 10, 2010 showing dilated small-bowel loops out of proportions to the amount of colonic gas suspicious for small-bowel obstruction.  CT of abdomen and pelvis done on September 11, 2010 showing dilated fluid-filled small bowel loops measuring up to 4.5 cm in  diameter, although numerous small bowel adhesions can be identified a discrete transition zone is not visualized; however, distal ileal loop and terminal ileum are completely decompressed.  Imaging features are compatible with small bowel obstruction likely secondary to adhesions.  No evidence for associated free fluid or free air.  No small bowel thickening is identified. Repeat abdominal x-rays showed little change on September 12, 2010.  An NG tube was placed and verified by abdominal x-ray on the September 12, 2010.  LABORATORY DATA:  On admission, the patient's CBC was unremarkable with white count of 9.6 and hemoglobin of 11.0.  CMET was as follows 133/5.0/97/26/43/2.28/159.  T-bili of 0.4, alk phos is 58, AST/ALT 24/38, total protein 7.3, albumin 4.3, and calcium of 10.0.  Also on admission, urinalysis was negative with the exception of moderate leukocytes.  On microscopy, there were rare bacteria, 11-20 white cells, and 0-2 red blood cells.  Urine culture was negative.  Lipase was 21, lactic acid was 2.2, and ammonia level was 25.  The patient's electrolytes normalized on the day of discharge and her creatinine had decreased to 1.50, which appears to be at her baseline.  In addition, her CBC remained normal, the day prior to discharge 7.5/8.1/23.9/177.  BRIEF HOSPITAL COURSE:  This is a 70 year old female with extensive past medical  history presenting with recurrent small bowel obstruction which resolved with conservative management. 1. Small bowel obstruction.  The patient initially was made n.p.o. and     an NG tube was placed by Surgery.  The patient was able to start     tolerating clears and NG tube was put on April 1.  The patient had     a bowel movement on April 2 after a Fleet Enema.  The patient     continued having flatus and had second bowel movement prior to     discharge after having Fleet Enema.  Surgery quickly signed off and     felt that there was no surgical need for  this patient as she was     improving greatly with conservative management.  The patient was     hydrated with IV fluids.  She was n.p.o. and not tolerating p.o.     intake. 2. Diabetes mellitus.  The patient was placed on sliding-scale     insulin.  However, she is diet controlled at home.  Minimal NovoLog     was needed during this hospitalization.  The patient will continue     diet control at home. 3. Schizoaffective disorder.  The patient was continued on her home     Clozaril and hydroxyzine p.r.n. 4. Hypertension.  Initially the patient's home blood pressure     medications were held due to not taking p.o. and lower blood     pressures; however, these were quickly added back.  The patient was     taking p.o., she tolerated this well, one could consider increasing     the patient's Toprol from 100 mg to 150 mg daily at her blood     pressures were averaging in the 140s to 160s on the day of     discharge. 5. Hyperlipidemia.  The patient was continued on her home statins. 6. Hypothyroidism.  The patient was continued on her home Synthroid. 7. Chronic pain.  This is specifically in her neck.  The patient was given Lidoderm patches q.24 h. in addition.  Narcotics were back.     Initially the patient was requiring morphine; however, this was     changed to scheduled Tylenol as well as oxycodone p.r.n. as to not     complicate the patient's already chronic constipation.  FOLLOWUP APPOINTMENTS:  The patient is being discharged to SNF, followup will be done there accordingly.  DISCHARGE CONDITION:  The patient was discharged to SNF, [facility] in stable medical condition, able to tolerate p.o. and have bowel movements.    ______________________________ Demetria Pore, MD   ______________________________ Leighton Roach McDiarmid, M.D.    JM/MEDQ  D:  09/17/2010  T:  09/17/2010  Job:  [id number]  cc:   Delbert Harness, MD  Electronically Signed by Demetria Pore MD on 09/21/2010  10:25:28 PM Electronically Signed by Acquanetta Belling M.D. on 09/28/2010 09:34:44 AM

## 2010-09-28 NOTE — H&P (Signed)
NAME:  Rebecca [name], Rebecca [name]                  ACCOUNT NO.:  [id number]  MEDICAL RECORD NO.:  [id number]           PATIENT TYPE:  LOCATION:                                 FACILITY:  PHYSICIAN:  Leighton Roach McDiarmid, M.D.DATE OF BIRTH:  22-Nov-1940  DATE OF ADMISSION:  09/11/2010 DATE OF DISCHARGE:                             HISTORY & PHYSICAL   PRIMARY CARE PHYSICIAN:  Dr. Delbert Harness at [facility].  CHIEF COMPLAINT:  Abdominal pain.  HISTORY OF PRESENT ILLNESS:  Rebecca [name] is a 70 year old female, who is presenting with a history of small bowel obstruction approximately 1 year ago, who today has a 1-day history of abdominal pain, nausea, and vomiting after a meal.  The patient states that she was feeling apparently up to this meal, started having generalized pain, nonfocal, constant.  Denies any type of diarrhea, constipation, fevers, chills, shortness of breath, dyspnea on exertion, or dysuria.  The patient does state that she might have not been urinating as much as she usually does.  Otherwise, has been feeling remarkably normal up to this abdominal pain.  The patient at this point is still in somewhat of the pain, but states that it has been better since the patient has a nasogastric tube placed.  PAST MEDICAL HISTORY: 1. Hypothyroidism. 2. Diabetes type 2, diet controlled. 3. Hypercholesterolemia. 4. Gout. 5. Obesity. 6. Anemia. 7. Schizoaffective disorder. 8. Hypertension. 9. Coronary artery disease status post stent. 10.Hemorrhoids. 11.GERD. 12.Constipation, chronic. 13.Renal insufficiency, chronic.  PAST SURGICAL HISTORY:  The patient has had a partial hysterectomy back in 1985.  The patient also has laparoscopic cholecystectomy done.  SOCIAL HISTORY:  The patient is divorced.  Lives in a pseudo-assisted living facility.  She is retired.  Former smoker, 35-pack-year smoking history, quit in 2005.  No alcohol and no drug use.  The patient's daughter, Rebecca [name],  is very involved and is here at bedside as well.  FAMILY HISTORY:  Father died at the age of 58 with heart disease. Mother died in the 35s with heart failure and diabetes.  ALLERGIES:  No known drug allergies.  CURRENT MEDICATIONS: 1. Tylenol 650 mg 8 hours as needed. 2. Albuterol 1-2 puffs inhaled every 4 hours as needed. 3. Aspirin 81 mg daily. 4. Calcium carbonate and vitamin D 1 tablet daily. 5. Plavix 75 mg daily. 6. Clozaril taking 300 mg daily. 7. Diltiazem 180 mg daily. 8. Hydroxyzine 25 mg two times daily as needed for itching. 9. Imdur 60 mg taking 1 tablet daily. 10.Synthroid 50 mcg p.o. daily. 11.Lidoderm patch. 12.Metoprolol 15 mg taking 100 mg daily. 13.Nitroglycerin 0.4 mg sublingual every 5 minutes x3 for chest pain. 14.Fish oil taking 2 g p.o. daily. 15.Protonix 40 mg daily. 16.MiraLax 1 capsule as needed for constipation. 17.Pravastatin 20 mg daily. 18.Kenalog ointment.  PHYSICAL EXAM:  VITAL SIGNS:  The patient's pulse 74, blood pressure 109/55, respirations 20, O2 95% on room air, temperature 98.2. GENERAL:  The patient is alert, cooperative, and appears stated age. Pupils are equal, round, reactive to light, and accommodation. Extraocular movements intact. HEART:  S1-S2 normal.  No murmur,  rub, or gallop are appreciated. LUNGS:  Clear to auscultation.  No wheezes.  No crackles. ABDOMEN:  There is some mild guarding present.  No CVA tenderness.  The patient is significantly distended.  Bowel sounds are hypoactive. EXTREMITIES:  Normal.  Atraumatic.  No cyanosis.  No edema to the feet. Good pulses. SKIN:  No rashes. NEUROLOGIC:  No focal findings.  LABS:  The patient did have a BMET done, had a sodium of 133, potassium of 5.0, chloride of 97, bicarb of 26, BUN of 43, creatinine of 2.28, glucose of 159.  The patient had CBC done, had a white blood cell count of 9.6, hemoglobin of 11.0, hematocrit of 32.7, and MCV of 83.6, and platelets of 201.  The  patient had an abdominal series done which showed dilated small bowel loops, high proportion to the amount of colonic gas, suspicious for small bowel obstruction and mild enlargement of the cardiac silhouette.  ASSESSMENT AND PLAN:  Rebecca [name] is a 70 year old female presenting with abdominal pain and found small bowel obstruction. 1. Small bowel obstruction.  We do have an NG tube placed and the     patient has already had 1 liter out and is continuing to have a     good drainage.  We will consider getting a surgical consult in the     morning if the patient is still not doing better.  We will continue     to monitor with serial abdominal exams, a KUB in the morning.  We     will do keep the patient n.p.o. and do D5 and had fluids until the     patient is able to eat.  Morphine for any type of pain and we will     see how the patient does. 2. Acute on chronic kidney disease is likely due to dehydration, 20 to     1 BUN/creatinine ratio.  The patient's peak creatinine is very     close to her baseline which is somewhere between 1.7 and 2.  We     will continue IV fluids and see if any improvement in the morning. 3. Diabetes.  We will start insulin sliding scale while here.  The     patient is usually diet controlled.  The patient will have some D5     in her fluids. 4. Hypertension.  We will do metoprolol 5 mg IV q.6 h. as well as     n.p.o. until taking p.o. 5. Hypothyroidism.  We will start Synthroid 25 mg IV, then transition     to 50 mcg p.o. 6. Hyperlipidemia.  We will hold the statin until taking p.o. 7. FEN-GI, IV fluids at 150 with D5 n.p.o. for now. 8. Prophylaxis.  Heparin and Protonix. 9. Disposition.  This will be pending clinical improvement.     Antoine Primas, DO   ______________________________ Etta Grandchild, M.D.    ZS/MEDQ  D:  09/11/2010  T:  09/11/2010  Job:  [id number]  Electronically Signed by Antoine Primas  on 09/23/2010 07:49:18 AM Electronically  Signed by Acquanetta Belling M.D. on 09/28/2010 09:34:46 AM

## 2010-10-19 NOTE — Consult Note (Signed)
NAMESTEFANEE, [name]                  ACCOUNT NO.:  [id number]  MEDICAL RECORD NO.:  [id number]           PATIENT TYPE:  I  LOCATION:  3730                         FACILITY:  [facility]  PHYSICIAN:  Cherylynn Ridges, M.D.    DATE OF BIRTH:  Sep 10, 1940  DATE OF CONSULTATION:  09/11/2010 DATE OF DISCHARGE:                                CONSULTATION   REQUESTING PHYSICIAN:  Leighton Roach McDiarmid, MD  CONSULTING SURGEON:  Marta Lamas. Lindie Spruce, MD  PRIMARY CARE PHYSICIAN:  Delbert Harness, MD  REASON FOR CONSULTATION:  Small bowel obstruction.  HISTORY OF PRESENT ILLNESS:  Rebecca [name] is a 70 year old white female who has multiple medical problems who presented to the emergency department last night was accompanied by her daughter at that time after with a 1- day history of abdominal pain with associated nausea and vomiting.  This history is mostly obtained from e-chart, as the patient is currently quite somnolent for reasons unknown as she has not had any pain medicine or had any of her psychotic medicines.  Therefore, the patient is unable to provide significant history.  Apparently upon presentation to the emergency department, the patient complained of generalized abdominal pain.  She denied any diarrhea or constipation that was out of the ordinary.  I am unsure at this time when her last bowel movement was or whether she is able to pass flatus or not.  She denies any fevers or chills.  Upon arrival to the emergency department, the patient had a CT scan, which revealed dilated fluid-filled small bowel loops measuring up to 4.5 cm.  There was no discrete transition zone; however, the distal ileal loops and terminal ileum are decompressed consistent with small bowel obstruction likely secondary to adhesions.  Because of this reason, the patient was admitted.  NG tube was placed and apparently over a liter of bilious output has been returned so far.  Because of these findings, we have been asked to evaluate  the patient for further recommendations.  REVIEW OF SYSTEMS:  Please see HPI.  Otherwise, all other systems are currently unable to be reviewed at this time given the patient's mental status.  FAMILY HISTORY:  Noncontributory.  PAST MEDICAL HISTORY PER E-CHART: 1. Hypothyroidism. 2. Diabetes mellitus. 3. Hypercholesterolemia. 4. Obesity. 5. Gout. 6. Anemia. 7. Schizo-affective disorder. 8. Hypertension. 9. Coronary artery disease. 10.GERD. 11.Chronic constipation. 12.Chronic renal insufficiency. 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 14.History of congestive heart failure.  PAST SURGICAL HISTORY: 1. Laparoscopic cholecystectomy. 2. Partial hysterectomy.  Other surgeries are unknown at this time.  SOCIAL HISTORY:  The patient is divorced.  She is a former smoker.  She quit in 2005.  She denies any alcohol or illicit drug abuse.  The patient does apparently have a daughter and a son who are very involved.  ALLERGIES:  NKDA.  MEDICATIONS: 1. Tylenol. 2. Albuterol. 3. Aspirin 81 mg. 4. Calcium carbonate with vitamin D. 5. Plavix 75 mg a day. 6. Clozapine 7. Diltiazem. 8. Atarax. 9. Imdur. 10.Synthroid. 11.Lidoderm patch. 12.Toprol-XL. 13.Nitroglycerin 14.Fish oil. 15.Protonix. 16.MiraLax. 17.Pravachol. 18.Kenalog.  PHYSICAL EXAMINATION:  GENERAL:  [name] is a 70 year old somnolent,  obese white female who is currently lying in bed in no acute distress. VITAL SIGNS:  Temperature 98.2, pulse 86, respirations 18, blood pressure 118/68. HEENT:  Head is normocephalic, atraumatic.  Sclerae noninjected.  Pupils are equal, round, and reactive to light.  Ears and nose without any obvious masses or lesions.  No rhinorrhea.  However, she does have an NG tube in her right naris.  This does have some bilious output present. Otherwise, mouth is pink and dry. HEART:  Regular rate and rhythm.  Normal S1, S2.  No murmurs, gallops, rubs are noted. LUNGS:  Clear to auscultation bilaterally with no  wheezes, rhonchi, or rales noted.  Respiratory effort is nonlabored. ABDOMEN:  Soft but distended.  She is diffusely mildly tender.  She does have active bowel sounds.  She does not have any peritoneal signs.  No masses or hernias are noted. MUSCULOSKELETAL:  All four extremities are essentially symmetrical with no cyanosis, clubbing, or edema. SKIN:  Warm and dry with no masses, lesions, or rashes. PSYCH:  The patient is quite somnolent at this time.  She does answer some question, but tends to mutter these, and is not very clear in her statement.  LABORATORY DATA AND DIAGNOSTICS:  Lactic acid is 2.2, lipase is 21. Sodium 135, potassium 5.0, glucose 155, BUN 52, creatinine 3.03, total bilirubin 0.5, all other LFTs are normal.  Albumin is 3.9.  White blood cell count is up to 12,200 from 9000 yesterday.  Hemoglobin is 10.6, hematocrit 31.5, platelet count is 211,000.  Urinalysis is negative. Diagnostic CT scan of that of the pelvis reveals dilated fluid-filled small bowel loops, which measures up to 4.5 cm.  There is no discrete transition zone; however, the distal ileal loops and terminal ileum were decompressed consistent with a small bowel obstruction.  Repeat abdominal films today reveal little change in the degree of small bowel obstruction with significant gaseous distention of the small bowel.  IMPRESSION: 1. Small bowel obstruction. 2. Diabetes mellitus. 3. Acute-on-chronic renal failure. 4. Obesity. 5. Schizo-affective disorder. 6. Hypertension. 7. Coronary artery disease.  PLAN:  At this time, we agree with NG tube management.  We would continue this for now.  We would recommend a Fleets enema from below as the patient has a significant amount of stool in the colon as well as the history of chronic constipation.  There is no definite transition point pain on the CT scan, and we want to make sure that her chronic constipation is not contributing to at least partially  her obstruction. Most likely the source of her obstruction is due to adhesive disease from prior surgical intervention as she does have a prior history of small bowel obstructions.  In the meantime, we would continue the patient on conservative management.  However, if the patient does not begin to improve, she may require surgical intervention.  Currently, the patient's Plavix is being held and we agree with this as if she does need surgical intervention.  She will need to ideally be off of this for approximately 5 days.     Letha Cape, PA   ______________________________ Cherylynn Ridges, M.D.    KEO/MEDQ  D:  09/11/2010  T:  09/12/2010  Job:  [id number]  cc:   Delbert Harness, MD  Electronically Signed by Barnetta Chapel PA on 09/25/2010 01:06:03 PM Electronically Signed by Jimmye Norman M.D. on 10/19/2010 04:04:52 PM

## 2010-10-30 ENCOUNTER — Telehealth: Payer: Self-pay | Admitting: Family Medicine

## 2010-10-30 NOTE — Discharge Summary (Signed)
NAME:  Rebecca [name], Rebecca [name]                            ACCOUNT NO.:  [id number]   MEDICAL RECORD NO.:  [id number]                   PATIENT TYPE:  INP   LOCATION:  5707                                 FACILITY:  [facility]   PHYSICIAN:  Abigail Miyamoto, M.D.              DATE OF BIRTH:  1941/03/15   DATE OF ADMISSION:  06/16/2002  DATE OF DISCHARGE:  06/22/2002                                 DISCHARGE SUMMARY   HISTORY OF PRESENT ILLNESS:  The patient is a 70 year old female who had  recently undergone laparoscopic cholecystectomy for acute cholecystitis.  She presented back to the emergency department on June 16, 2002, with  abdominal pain and dehydration.  She was found on x-rays to have possible  small bowel obstruction.  She was found to have a creatinine elevated at 4.8  and a white count elevated at 13.6.  The plan at this point was to admit the  patient for IV rehydration, NG suctioning, and a CT scan to rule out  incisional hernia.   HOSPITAL COURSE:  The patient was admitted.  A family practice consult was  also obtained for the patient's other chronic medical problems.  By the next  morning on June 17, 2002, her white blood cell count decreased to 10.6,  she was passing flatus, and the nasogastric tube was draining minimal fluid.  Her creatinine was also improving and she continued to do well.  She did  have some mental status changes which were felt to be secondary to a change  in her antipsychotic medications.  By June 18, 2002, she was continuing to  improve and her creatinine had increased to 2.3.  It was felt that her  abdominal complaints could have been from an ileus as a CT scan was  unremarkable for any evidence of hernia or obstruction.  By June 19, 2002,  she was continuing to do well.  The nasogastric tube was removed and she was  started on a liquid diet, which she began to tolerate well.  Her mental  status greatly improved.  She was having small bowel movements  by June 20, 2002.  Family practice continued to follow the patient for her chronic  medical problems.  By June 21, 2002, she was awake, alert, and oriented,  again having normal bowel movements.  Her abdomen remained benign.  By  June 22, 2002, she was tolerating a regular diet, was ambulating well, and  her incisions were healing well.  Her creatinine had returned to normal  being 1.2.  The decision was made to discharge the patient home.   DISCHARGE DIAGNOSES:  1. Ileus and dehydration.  2. History of schizophrenia.   DISCHARGE DIET:  Regular.  Her diet has no restrictions.   DISCHARGE ACTIVITY:  As tolerated.  She is to do no heavy lifting.   DISCHARGE MEDICATIONS:  She will resume her home medications, which  include  atenolol, Altace, __________, Zyprexa, Synthroid, and Zyprexa for pain.   WOUND CARE:  She may shower.   FOLLOW-UP:  She will follow up with Dr. Chilton Si with the family practice  service in one week.  She will follow up at my office also in one week.                                               Abigail Miyamoto, M.D.   DB/MEDQ  D:  07/20/2002  T:  07/21/2002  Job:  [id number]

## 2010-10-30 NOTE — Discharge Summary (Signed)
Rebecca [name], Rebecca [name]                  ACCOUNT NO.:  [id number]   MEDICAL RECORD NO.:  [id number]          PATIENT TYPE:  INP   LOCATION:  6524                         FACILITY:  [facility]   PHYSICIAN:  Darlin Priestly, MD  DATE OF BIRTH:  [birth year]   DATE OF ADMISSION:  02/08/2005  DATE OF DISCHARGE:  02/10/2005                                 DISCHARGE SUMMARY   DISCHARGE DIAGNOSES:  1.  Coronary artery disease status post catheterization during this      admission with intervention.  2.  Hypertension.  3.  Hyperlipidemia.  4.  History of blood loss anemia.  5.  History of acute cholecystitis status post laparoscopic cholecystectomy.  6.  History of ileus and dehydration.  7.  History of schizophrenia.  8.  History of renal failure.  9.  Non-insulin-dependent diabetes mellitus.  10. Hypothyroidism.   This is a 70 year old female patient of Dr. Jenne Campus who underwent Cardiolite  stress test and this revealed evidence of inferolateral wall ischemia with  normal EF.  Dr. Jenne Campus saw her in the office on February 05, 2005.  Discussed  catheterization option with possible intervention.  Patient agreed and she  was brought to [facility] through short-stay unit for the  procedure.   HOSPITAL PROCEDURE:  Cardiac catheterization performed on February 09, 2005 by  Dr. Jenne Campus.  Revealed 50% stenosis of the mid circumflex, 95% stenosis of  the mid portion of the right coronary artery, and 30% stenosis mid to distal  portion.  Dr. Jenne Campus performed angioplasty with stenting implanting TAXUS  stent of the right coronary artery lesion reducing the lesion from 95 to  less than 10%.  Patient tolerated procedure well and next morning was  assessed by Dr. Allyson Sabal.  Was stable from cardiovascular standpoint and  discharged home.   DISCHARGE DIET:  Low salt, low cholesterol, low fat diet.   DISCHARGE INSTRUCTIONS:  No driving.  No lifting greater than 5 pounds for  three days.   HOSPITAL  LABORATORIES:  White blood cell count was 10.1, hemoglobin 10.1,  hematocrit 28.7, platelet count 232.  Sodium 136, potassium 4.9, chloride  101, CO2 26, glucose 133, BUN 21, creatinine 1.5.  Her creatinine prior to  catheterization was 1.4.   DISCHARGE MEDICATIONS:  1.  Aspirin 81 mg daily.  2.  Plavix 75 mg daily.  3.  Synthroid 50 mcg daily.  4.  Omeprazole 20 mg daily.  5.  Zyprexa 50 mg daily.  6.  Clozaril 200 mg daily.  7.  Caltrate 60 mg daily.  8.  Sular 40 mg daily.  9.  Toprol XL 100 mg daily.  10. Zocor 20 mg daily.  11. Veg caps and Citrucel as before.   DISCHARGE FOLLOW-UP:  Dr. Jenne Campus will see patient on September 15 at 8:45  in our office.  Phone number was provided if patient has any questions or  concerns.      Raymon Mutton, P.A.      Darlin Priestly, MD  Electronically Signed    MK/MEDQ  D:  02/10/2005  T:  02/10/2005  Job:  [id number]

## 2010-10-30 NOTE — Telephone Encounter (Signed)
Rebecca [name] with Rebecca [name] calling to relay that Rebecca [name] wants to delay having in home therapy until next week.  Nurse was able to see her but therapist wasn't If t here is any questions regarding this please call number left.

## 2010-10-30 NOTE — Discharge Summary (Signed)
   NAME:  Rebecca [name], Rebecca [name]                            ACCOUNT NO.:  [id number]   MEDICAL RECORD NO.:  [id number]                   PATIENT TYPE:  INP   LOCATION:  5029                                 FACILITY:  [facility]   PHYSICIAN:  Abigail Miyamoto, M.D.              DATE OF BIRTH:  Aug 26, 1940   DATE OF ADMISSION:  05/21/2002  DATE OF DISCHARGE:  05/25/2002                                 DISCHARGE SUMMARY   DISCHARGE DIAGNOSIS:  Acute cholecystitis status post laparoscopic  cholecystectomy.   SUMMARY OF HISTORY:  Ms. Rebecca [name] is a 70 year old female who presented to  our office with abdominal pain, increased white count, and an ultrasound  showing her to have cholelithiasis, possible cholecystitis.  She was  admitted straight to the hospital from the office secondary to the diagnosis  of acute cholecystitis.   HOSPITAL COURSE:  The patient was admitted the following morning and was  placed on IV antibiotics, and the following morning was taken to the  operating room where she underwent a laparoscopic cholecystectomy.  She  tolerated the procedure well and was taken in stable condition to the  _________ surgical floor for IV antibiotics.  On postoperative day #1, her  WBC was 17,000 with a hemoglobin of 9.1.  She was continued on IV  antibiotics.  By postoperative day #2, her hemoglobin had decreased  slightly, and this was felt secondary to blood loss anemia.  She  subsequently was transfused two units of packed red blood cells.  By  postoperative day #3, hemoglobin increased to 10, and she was tolerating a  regular diet.  At this point, since she had remained afebrile and was  ambulating well, decision was made to discharge the patient home.   DISCHARGE DIAGNOSES:  1. Acute cholecystitis status post laparoscopic cholecystectomy.  2. Blood loss anemia.   DISCHARGE DIET:  Regular.   DISCHARGE ACTIVITY:  She is to do no heavy lifting greater than 20 pounds  for the next 2-3  weeks.   DISCHARGE MEDICATIONS:  She will resume her home medications.  She will take  Percocet and Advil for the pain.  She was a prescription for Augmentin 875  mg twice daily for five days.   DISCHARGE INSTRUCTIONS:  She may shower.  Home health has been arranged for  personal care services.   FOLLOW UP:  She will follow up in my office in 2-3 weeks post-discharge.                                               Abigail Miyamoto, M.D.    DB/MEDQ  D:  07/20/2002  T:  07/21/2002  Job:  [id number]

## 2010-10-30 NOTE — Consult Note (Signed)
NAME:  Rebecca [name], Rebecca [name]                            ACCOUNT NO.:  [id number]   MEDICAL RECORD NO.:  [id number]                   PATIENT TYPE:  INP   LOCATION:  5707                                 FACILITY:  [facility]   PHYSICIAN:  Pearlean Brownie, M.D.            DATE OF BIRTH:  19-Oct-1940   DATE OF CONSULTATION:  06/17/2002  DATE OF DISCHARGE:                                   CONSULTATION   CHIEF COMPLAINT:  Abdominal pain.   HISTORY OF PRESENT ILLNESS:  This is a 70 year old white female, with a  history of hypertension and hypothyroidism, status post emergency  cholecystectomy on 05/22/2002, complaining of increased abdominal pain for  the last two days, nausea and vomiting x 2 days, and decreased urination x 2-  3 days.  She has been taking Tylenol and lying on the right side helping to  ease the pain.  She is complaining of abdominal distention after surgery.  She has had diarrhea since discharge from the hospital.  The patient states  she has some increased thirst and is able to drink fluids, but continues to  have vomiting despite Phenergan 25 mg. suppository x 1.  The pain is  localized to the right and left lower quadrants, with no radiation to the  groin.  She describes the pain as constant.   REVIEW OF SYSTEMS:  CONSTITUTIONAL:  Subjective fevers or chills that  started on 06/13/2002.  Did not take temperature at home.  CARDIOVASCULAR:  Dizzy, lightheaded.  GASTROINTESTINAL:  Nausea and vomiting x 2 days,  multiple emesis.  Abdominal pain and diarrhea.  RESPIRATORY:  No shortness  of breath, no cough.  SKIN:  Clammy and diaphoretic.  PSYCHIATRIC:  Mood  stable on medications.  GENITOURINARY:  Bladder distention, trouble  urinating, little urine output with each urination.  No complaints of  dysuria.   PAST MEDICAL HISTORY:  1. Hypothyroidism.  2. Obesity.  3. Psychosis.  4. Tobacco abuse.  5. Hypertension.  6. Reflux esophagitis.  7. Osteoarthritis.   Denies any  history of renal failure or kidney problems.   PAST SURGICAL HISTORY:  1. Emergency cholecystectomy on 05/22/2002, by laparoscopic cholecystectomy     by Dr. Johna Sheriff.  2. Hysterectomy in 1995.  3. Carpal tunnel surgery in 1989.   MEDICATIONS:  1. Altace 2.5 mg. q.d.  2. Atenolol 50 mg. q.d.  3. Clozapine 175 mg. q.d.  4. Prevacid 30 mg. q.d.  5. Synthroid  50 mcg. q.d.  6. Tylenol 500 mg. 2 p.o. b.i.d. to t.i.d.  7. Zyprexa 2.5 mg. q.a.m. and at 3:00 p.m. and 10 mg. q.h.s.  8. Celebrex  100 mg. b.i.d.   ALLERGIES:  No known drug allergies.   SOCIAL HISTORY:  Smokes 1 pack per day x 35 years.  No history of alcohol or  drug use.  Lives in [place].   FAMILY HISTORY:  Mother deceased in 14s with  heart failure and diabetes.  Father died at age 11 with heart disease.  Daughter has questionable stomach  problems.   PHYSICAL EXAMINATION:  VITAL SIGNS:  Temperature afebrile, pulse 95,  respirations 20, blood pressure 81/42 on repeat 104/44.  GENERAL:  Alert and oriented x 3, appearing in mild distress.  Answers  questions appropriately.  HEENT:  Head normocephalic/atraumatic.  Pupils equal, round and reactive to  light and accomodation.  Extraocular muscles intact bilaterally.  Nares  patent.  Oropharynx pink and moist.  Buccal membranes, tongue and lips  appear dry.  The oropharynx was slightly injected and there is a white  exudate present on the tongue.  NECK:  No masses or thyromegaly palpated.  LUNGS:  Clear to auscultation bilaterally without wheezing, rales or  rhonchi.  No increased work of breathing.  HEART:  Regular rate and rhythm without murmurs.  No rubs.  MUSCULOSKELETAL:  No edema or deformity.  Capillary refill 3 seconds.  Extremities warm and dry.  ABDOMEN:  Distended, skin taut, dull to percussion, tender in the left lower  quadrant and periumbilical.  No fluid wave present.  No hepatosplenomegaly  palpated.  SKIN:  Good skin turgor, no pallor or cyanosis.   NEUROLOGIC:  Sensation intact.   LABORATORY DATA:  CBC obtained white count 13,600, hemoglobin 12.6,  hematocrit 36.9, platelet count 341,000.  BMET reveals a sodium of 136,  potassium 4.7, chloride 106, bicarb. 21, BUN  43, creatinine 4.8.  Chest x-  ray revealed no apparent disease or infiltrate.  CT of abdomen pending.  Acute abdominal series consistent with small bowel obstruction.   ASSESSMENT AND PLAN:  This is a 70 year old white female, with acute  abdominal pain, nausea, vomiting and diarrhea, postop cholecystectomy on  05/22/2002, also found to be severely dehydrated with acute renal failure.  Ask for consult by Surgery for working up acute renal failure and  dehydration.   1. Acute abdominal pain.  Probable small bowel obstruction followed by Dr.     Johna Sheriff.  The plan is to keep the patient n.p.o.  Place nasogastric     tube.  Obtain CT of the abdomen and acute abdominal series.  2. Acute renal failure.  Found old records to show a creatinine at 1.4-1.9     from previous hospitalization on 07/24/2001.  This bumped BUN and     creatinine likely secondary to dehydration as it is acute.  Rehydrate     with normal saline bolus and needs replacement for 10-15% deficit plus     maintenance.  Closely follow I's and O's by Foley catheter.  Look for     urinary retention or obstruction.  Recheck BMET in the morning.  Obtain     urine sodium and creatinine which are pending.  3. Hypertension.  Atenolol is to be given if systolic blood pressure greater     than 100.  4. Psychosis.  Continue home medications including Clozapine and Zyprexa.  5. Hypothyroidism.  Continue with Synthroid per home dose.       Lorne Skeens, D.O.                         Pearlean Brownie, M.D.    Erick Alley  D:  06/17/2002  T:  06/17/2002  Job:  [id number]

## 2010-10-30 NOTE — Cardiovascular Report (Signed)
[name], [name]                  ACCOUNT NO.:  [id number]   MEDICAL RECORD NO.:  [id number]          PATIENT TYPE:  INP   LOCATION:  3703                         FACILITY:  [facility]   PHYSICIAN:  Darlin Priestly, MD  DATE OF BIRTH:  1941/03/05   DATE OF PROCEDURE:  02/09/2005  DATE OF DISCHARGE:                              CARDIAC CATHETERIZATION   PROCEDURES PERFORMED:  1.  Left heart catheterization.  2.  Coronary angiography.  3.  Left ventriculogram.  4.  Right coronary artery, mid.      1.  Percutaneous transluminal coronary balloon angioplasty.      2.  Placement of intracoronary stent.   ATTENDING:  Darlin Priestly, M.D.   COMPLICATIONS:  None.   INDICATIONS:  Ms. Rebecca [name] is a 70 year old female patient of Dr. Willia Craze  [facility], history of non-insulin-dependent diabetes mellitus,  hypertension, mild renal insufficiency, hypothyroidism.  Recently underwent  Cardiolite scan on January 19, 2005 revealing no evidence of inferolateral  ischemia with normal EF.  She is now brought for a cardiac catheterization  to rule out CAD.   DESCRIPTION OF OPERATION:  After giving informed written consent, patient  brought to the cardiac catheterization laboratory.  Right and left groin  shaved, prepped and draped in usual sterile fashion.  ECG monitor  established.  Using a modified Seldinger technique, a #6-French arterial  sheath inserted in right femoral artery.  A 6-French diagnostic catheter was  then used to perform diagnostic angiography.   Left main is a large vessel with no significant disease.   LAD is a large vessel coursed to the apex, gives rise to one diagonal  branch.  The LAD has no significant disease.   The first diagonal is a medium sized vessel, bifurcates distally with no  significant disease.   Left circumflex is a medium sized vessel coursing the AV groove.  Gave rise  to four obtuse marginal branches.  The AV groove is noted to have 50%  lesion  after the takeoff of the third obtuse marginal.   The first and second OMs are small vessels with no significant disease.   The third OM is a medium sized vessel which bifurcates distally with no  significant disease.   The fourth OM is a medium sized vessel which bifurcates with no significant  disease.   The right coronary artery is a large vessel which is dominant, gives rise to  both PDA/posterolateral branch.  There is 95% mid RCA stenosis with mild 30%  distal RCA stenosis.  PDA and posterolateral branch have no significant  disease.   Left ventriculogram reveals estimated EF approximately 50%.  This is a hand  injection given the history of renal insufficiency.   HEMODYNAMICS:  Systemic arterial pressure 160/80, LV systemic pressure  159/11, LVEDP of 15.   INTERVENTIONAL PROCEDURE:  RCA-mid:  Following diagnostic angiography a #6-  French JR4 guiding catheter with side holes was __________ engaged in the  right coronary ostium.  Next, a 0.014 Forte marker wire was advanced guiding  catheter and positioned in the distal  PDA without difficulty.  Following  this a Maverick 3 x 15 mm balloon was then used to cross the mid RCA  stenotic lesion.  One inflation to 6 atmospheres was performed for a total  of 24 seconds.  Follow-up angiogram revealed good luminal gain.  This  balloon was then removed and a TAXUS 3.5 x 20 mm stent was then positioned  across the mid RCA stenotic lesions.  Three inflations to a maximum of 14  atmospheres was performed for a total of approximately 1 minute and 10  seconds.  Follow-up angiogram revealed no evidence of dissection or thrombus  with excellent luminal gain.  The mid portion of the stent appeared to have  persistent mild wasting.  This stent balloon was then removed and a 4 x 12  mm Quantum Ranger was then positioned within the mid RCA stent.  Three  inflations to a maximum of 20 atmospheres was performed for a total of   approximately 1 minute.  Follow-up angiogram revealed no evidence of  dissection or thrombus with TIMI 3 flow to the distal vessel.  IV Angiomax  was used throughout the procedure.   Final orthogonal angiograms revealed less than 10% residual stenosis in the  mid RCA stenotic lesion.  At this point we elected to conclude the  procedure.  All balloons, wires, lines, and catheters were removed.  Hemostatic sheath was sewn in place and the patient returned back to the  ward in stable condition.   CONCLUSION:  1.  Successful percutaneous transluminal coronary balloon angioplasty and      placement of a TAXUS 3.5 x 20 mm stent in the mid right coronary artery      stenotic lesion ultimately post dilated to 4 mm.  2.  Adjunct use of Angiomax infusion.      Darlin Priestly, MD  Electronically Signed     RHM/MEDQ  D:  02/09/2005  T:  02/09/2005  Job:  [id number]   cc:   Kerby Nora, MD  Fax: [phone]

## 2010-10-30 NOTE — Op Note (Signed)
NAME:  Rebecca [name], Rebecca [name]                            ACCOUNT NO.:  [id number]   MEDICAL RECORD NO.:  [id number]                   PATIENT TYPE:  INP   LOCATION:  5029                                 FACILITY:  [facility]   PHYSICIAN:  Abigail Miyamoto, M.D.              DATE OF BIRTH:  July 26, 1940   DATE OF PROCEDURE:  05/22/2002  DATE OF DISCHARGE:                                 OPERATIVE REPORT   PREOPERATIVE DIAGNOSIS:  Acute cholecystitis.   POSTOPERATIVE DIAGNOSIS:  Acute cholecystitis.   PROCEDURE:  Laparoscopic cholecystectomy.   SURGEON:  Abigail Miyamoto, M.D.   ASSISTANT:  Velora Heckler, M.D.   ANESTHESIA:  General endotracheal anesthesia and 0.25% Marcaine with  epinephrine.   ESTIMATED BLOOD LOSS:  Minimal.   DESCRIPTION OF PROCEDURE:  The patient was brought to the operating room and  identified as Rebecca [name].  She was placed supine on the operating table and  general endotracheal anesthesia was induced.  Her abdomen was then prepped  and draped in the usual sterile fashion.  Using a #15 blade, a small  transverse incision was made below the umbilicus.  The incision was carried  down to the fascia which was then opened with the scalpel.  Hemostat was  then used to pass through the peritoneal cavity.  Next, a 0 Vicryl  pursestring suture was placed around the fascial opening.  The Hasson port  was placed through this opening and insufflation of the abdomen was begun.  A 12 mm port was then placed in the patient's epigastrium and two 5 mm ports  were placed in the patient's right flank under direct vision.  The  gallbladder was found to be acutely inflamed and distended and had omentum  stuck to it.  The omentum was able to be dissected off the gallbladder  bluntly.  The gallbladder was then needle aspirated and white bile was  suctioned out of the gallbladder consistent with cystic duct obstruction.  The gallbladder was then grasped and retracted above the liver bed.  Extensive dissection was then carried out in order to identify the cystic  duct. The cystic duct was foreshortened and therefore cholangiogram could  not be performed.  The cystic duct once identified was clipped three times  proximally, once distally, and transected with scissors.  The cystic artery  and branch were identified and clipped proximally and distally and  transected as well.  The gallbladder was then slowly dissected free from the  liver bed with the electrocautery.  Once the gallbladder was freed from the  liver bed, it was placed in an endosac.  The liver bed was then again  examined and hemostasis was found to be achieved.  Secondary to the necrotic  gallbladder bed, the decision was made to place a piece of Surgicel in this  area.  The distal end of the gallbladder was then removed through the  incision  in the umbilicus.  The 0 Vicryl in the umbilicus was then tied in  place closing the fascial defect.  A separate 0 Vicryl suture was also  placed at the incision in a figure-of-eight pattern.  The abdomen was then  copiously irrigated with normal saline.  Again hemostasis appeared to be  achieved.  All ports were then removed under direct vision and the abdomen  was deflated.  All incisions were anesthetized with  0.25% Marcaine and closed with 4-0 Monocryl subcuticular sutures.  Steri-  Strips, gauze, and tape were then applied.  The patient tolerated the  procedure well.  All needle, sponge, and instrument count correct at the end  of the procedure.  The patient was then extubated in the operating room and  taken in stable condition to the recovery room.                                               Abigail Miyamoto, M.D.    DB/MEDQ  D:  05/22/2002  T:  05/22/2002  Job:  [id number]

## 2010-10-30 NOTE — Discharge Summary (Signed)
NAME:  Rebecca [name], Rebecca [name]                  ACCOUNT NO.:  [id number]   MEDICAL RECORD NO.:  [id number]          PATIENT TYPE:  INP   LOCATION:  5712                         FACILITY:  [facility]   PHYSICIAN:  Santiago Bumpers. Hensel, M.D.DATE OF BIRTH:  01-Aug-1940   DATE OF ADMISSION:  07/20/2006  DATE OF DISCHARGE:  08/03/2006                               DISCHARGE SUMMARY   ADDENDUM TO DISCHARGE SUMMARY   Inpatient refused to accept the patient and she was subsequently  discharged to her daughter's home with home health PT and OT on August 03, 2006.   Changes to her medications were as follows:  1. Calcitriol was discontinued as her PTH was less than 2.5 on repeat.  2. The Tums were discontinued.  3. Renagel 800 mg p.o. t.i.d. for phosphate binder was started as her      calcium was elevated with an ionized of 1.34.   The patient continued to complain of left shoulder pain and an injection  of Kenalog and Marcaine was injected into the soft tissue point of  maximal tenderness of the left shoulder on August 02, 2006.  The  patient may need to have a followup MRI, at some point, if her left  shoulder pain continues not to improve.     ______________________________  Lupita Raider, M.D.    ______________________________  Santiago Bumpers. Leveda Anna, M.D.    KS/MEDQ  D:  08/03/2006  T:  08/04/2006  Job:  [id number]

## 2010-10-30 NOTE — Consult Note (Signed)
NAMEWANDRA, Rebecca [name]                  ACCOUNT NO.:  [id number]   MEDICAL RECORD NO.:  [id number]          PATIENT TYPE:  INP   LOCATION:  4702                         FACILITY:  [facility]   PHYSICIAN:  Benn Moulder, M.D.      DATE OF BIRTH:  14-May-1941   DATE OF CONSULTATION:  07/22/2006  DATE OF DISCHARGE:                                 CONSULTATION   CONSULTING PHYSICIAN:  Dr. Kathrene Bongo with [facility].   PHYSICIAN REQUESTING CONSULTATION:  Dr. Doralee Albino with [facility].   REASON FOR CONSULTATION:  Acute on chronic renal failure in the setting  of rhabdomyolysis.   HISTORY OF PRESENT ILLNESS:  The patient is a 70 year old female with  history of hypertension, diabetes, coronary artery disease, and chronic  kidney disease with baseline creatinine of 1.5 who is admitted on  July 20, 2006, after he had been down for a period of 17 hours.  She  had a 2-day history of congestion, sore throat, muscle aches, and fevers  prior to admission.  She denies losing consciousness or seizing, but was  simply too weak to get up.  On admission her serum CK was greater than  50,000.  This has trended down, but her renal function has worsened,  going from a creatinine of 1.9 to 3.5 despite aggressive IV fluids.  Urine output has been just under 100 mL an hour over the past couple of  days.  Currently she denies any shortness of breath, chest pain, nausea,  or vomiting, or mental status changes.  Her son-in-law is present and  confirms this.  Of note her nasal swab was influenza A-positive.  Currently she has IV fluids with normal saline at 200 mL an hour and  Lasix 20 mg IV was started this afternoon by the primary team.   PAST MEDICAL HISTORY:  1. Chronic kidney disease, stage 3.  2. Hyperthyroidism.  3. Schizoaffective disorder.  4. Hypertension.  5. GERD.  6. Arthritis.  7. Diabetes.  8. Anemia.  9. Coronary artery disease.   HOME  MEDICATIONS:  1. Aspirin 325 mg daily.  2. Clozapine 100 mg daily.  3. Imdur 600 mg daily.  4. Plavix 75 mg daily.  5. Prilosec 20 mg daily.  6. Simvastatin 20 mg daily.  7. Synthroid 50 mcg daily.  8. Toprol-XL 100 mg daily.  9. Zyprexa 15 mg p.o. q.h.s.  10.Diltiazem 240 mg 1 tab daily.  11.Lasix 20 mg daily.   ALLERGIES:  NO KNOWN DRUG ALLERGIES.   FAMILY HISTORY:  Mom died at 12 of CHF and diabetes.  Father died in 30s  of heart disease.  No family members with end-stage kidney disease.   SOCIAL HISTORY:  Quit smoking in November of 2005, no alcohol or other  drugs.  Lives by herself at the [facility].   PHYSICAL EXAMINATION:  VITAL SIGNS:  Temperature 98.4, pulse 84, blood  pressure 140/70, respiratory rate 24, 95% on 2 liters.  Intake 70-80.  Output 2000 over the last 24 hours with 3320 in today and 900 out today.  GENERAL:  Sleeping, no acute distress.  HEENT:  No scleral icterus.  Mucous membranes are moist.  NECK:  No JVD but still puffy.  CARDIOVASCULAR EXAM:  Regular rate and rhythm.  No rubs.  LUNGS:  Coarse breath sounds bilaterally with positive upper airway  noises.  ABDOMEN:  Positive bowel sounds.  Obese.  Nontender, nondistended.  EXTREMITIES:  SCDs in place.  No lower extremity edema.  Slight edema of  hands bilaterally, with multiple bruises in her bilateral upper  extremities.  NEURO:  Alert and oriented x3.  Cranial nerves II-XII grossly intact.   LABORATORY DATA:  Imaging studies shows x-ray on July 21, 2006,  showed bibasilar atelectasis.  CT head on June 19, 2006 showed chronic  small vessel ischemic changes but no acute abnormalities.  CK 50,000  down to 37,724, down to 31,766.  Influenza A-positive.  Sodium 131,  potassium 4.9, chloride 106, bicarb 17, BUN 41, creatinine 3.47, glucose  121, total bilirubin 0.4, alk-phos 47, AST 474, ALT 162, albumin 2.5,  calcium 7.1, phos 3.1, calcium 8.2, magnesium 1.7.  White blood cell  count 8,  hemoglobin 8.9, hematocrit 25.8, platelets 146,000.  TSH 1.832,  T3 55.4, T4 4.9, PT 15.1, INR 1.2, hemoglobin A1c 6.3.  Urinalysis:  Specific gravity 1.013, negative nitrite, trace leukocyte esterase, 300  protein, positive hemoglobin, 0-2 white cells, 0-2 red cells.   ASSESSMENT AND PLAN:  A 70 year old female with diabetes, hypertension,  coronary artery disease, now with influenza A, acute on chronic renal  failure, and rhabdomyolysis.  1. Rhabdomyolysis, likely secondary to fall.  Continue aggressive      rehydration with intravenous fluids.  __________  by adding      bicarbonate to intravenous fluids.  Continue fluids at 200 mL per      hour for now.  2. Acute-on-chronic renal failure secondary to rhabdomyolysis.  We      will aim for euvolemia, so we will increase Lasix to 80 mg      intravenous b.i.d. and follow ins and outs closely and adjust as      necessary.  3. The patient has stage 3 chronic kidney disease.  We will recommend      to continuing to manage risk factors aggressively as an outpatient.  4. Hypertension.  Blood pressure slightly above goal.  Continue      diltiazem per primary team.  5. Coronary artery disease.  Continue Plavix, aspirin, per primary      team.  6. Psychiatric, continue Clozaril and Zyprexa.  Vital signs stable.      Neuroleptic malignant syndrome is possible etiology for      rhabdomyolysis, although less likely than from fall __________      down.  7. Hypothyroidism, per primary team, continue Synthroid.  8. Influenza A.  Contact precautions, supportive care.  9. Diabetes, A1c 6.3.  Continue sliding scale insulin.  10.Hypernatremia, likely chronic.  Possibly related to the patient's      psychosis.  11.Anemia, chronic, likely worsened by hemodilution.  Recommend      checking iron studies, if not done already as an outpatient.      Benn Moulder, M.D.    MR/MEDQ  D:  07/22/2006  T:  07/24/2006  Job:  [id number]

## 2010-10-30 NOTE — H&P (Signed)
NAMEMELLA, Rebecca [name]                  ACCOUNT NO.:  [id number]   MEDICAL RECORD NO.:  [id number]          PATIENT TYPE:  INP   LOCATION:  4703                         FACILITY:  [facility]   PHYSICIAN:  Ruthe Mannan, M.D.       DATE OF BIRTH:  09-Jul-1940   DATE OF ADMISSION:  07/20/2006  DATE OF DISCHARGE:                              HISTORY & PHYSICAL   PRIMARY CARE Matisyn Cabeza:  Devra Dopp, MD   ATTENDING PHYSICIAN:  Dr. Tivis Ringer.   CHIEF COMPLAINT:  Congestion, sore throat, muscle aches and subjective  fevers.   HISTORY OF PRESENT ILLNESS:  Ms. Routson is a 70 year old, white female with  a complicated past medical history, including multiple medical problems,  who presented to the ED with a 2-day history of congestion, sore throat,  muscle aches, and subjective fevers.  Patient reports that she took her  meds last night at 8 p.m., sat on the couch and got trapped in her  blanket and fell on the floor where she remained until her daughter  found her at 1 p.m. today.  She did not think she lost consciousness or  seized, but she had no witnesses.  She did not lose bowel or bladder  functions and states she tried to get up, but was too weak.  No recent  changes in her meds.   REVIEW OF SYSTEMS:  GENERAL:  Positive fevers, positive malaise.  PULMONARY:  Positive cough, positive congestion and sputum.  GASTROINTESTINAL:  No nausea, vomiting or diarrhea.  GENITOURINARY:  No  dysuria.   PAST MEDICAL HISTORY:  1. Hyperthyroidism.  2. Psychosis.  3. Hypertension.  4. Gastroesophageal reflux disease.  5. Arthritis.  6. Diabetes.  7. Chronic renal failure.  8. Anemia.  9. Coronary artery disease, status post stent.   MEDICATIONS:  1. Aspirin EC 325 mg daily.  2. Clozapine 100 mg daily.  3. Imdur 600 mg daily.  4. Plavix 75 mg daily.  5. Prilosec 20 mg daily.  6. Simvastatin 20 mg daily.  7. Synthroid 50 mcg daily.  8. Toprol XL 100 mg daily.  9. Zyprexa 15 mg p.o. q.h.s. p.r.n.  10.Diltiazem 240 mg 1 tab daily.  11.Lasix 20 mg daily.   ALLERGIES:  NO KNOWN DRUG ALLERGIES.   FAMILY HISTORY:  Mom died in her 51s of heart failure and diabetes.  Her  father died in his 43s of heart disease.  Daughter has stomach  problems.   SOCIAL HISTORY:  Patient smokes 1 pack per day x35 years and then quit  in November of 2005.  She also drinks about 3 glasses per day of  caffeine.  No alcohol or other drugs.  She lives at the [facility].   PHYSICAL EXAM:  VITAL SIGNS:  Temperature is 101.3, pulse is 112,  respiratory rate is 22, blood pressure is 165/73.  GENERAL APPEARANCE:  She is well-nourished, well-hydrated, well-  developed, in no acute distress.  MENTAL STATUS:  Alert and oriented x3 with a flat affect.  MOUTH AND THROAT:  No bleeding.  GUMS:  No tonsillar exudates.  LUNGS:  Poor respiratory effort, no rhonchi.  Expiratory wheezes with no  work of breathing.  HEART:  Tachycardiac, regular rhythm.  ABDOMEN:  Obese, nontender, nondistended.  Positive bowel sounds.  EXTREMITIES:  No edema.  Pulses 2+.  GENITOURINARY:  Visibly pigmented urine in Foley bag.  NEUROLOGICAL:  Cranial nerves II-XII intact.  No clonus.  No lead-pipe  rigidity.  Reflexes 2+.  SKIN:  Diffuse ecchymoses and scrapes on her knees and elbows, and warm  to touch.  No palpable nodes.   LABS:  White count of 11.2, hemoglobin 11.4, hematocrit 33.8, platelets  of 2.8 with 81% neutrophils.  Sodium 137, potassium 4.5, chloride of  104, CO2 23, BUN of 31.  Creatinine of 1.9, carries a baseline of 1.4.  Glucose of 146.  AST is 706.  Total creatinine greater than 50,000.  CK  MB 129.7.  EKG shows normal sinus.  Chest x-ray showed no acute  findings.  Head CT showed no acute findings.   ASSESSMENT AND PLAN:  This 70 year old white female, with multiple  medical problems, admitted with 1-day history of fevers, congestion,  myalgia and history of fall.  1. Rhabdomyolysis.  Differential is wide.  May  be secondary to falling      on floor and staying on the ground for almost 16 hours due to ERI      viral/bacterial illness.  White blood cell count is elevated with a      left shift and patient is febrile.  Will also check flu swab, urine      culture, blood culture.  Chest x-ray showed no acute findings.      Will hydrate aggressively as CK is greater than 50,000 with a CK MB      of 129.7.  Cannot exclude neuroleptic malignant syndrome as patient      takes Zyprexa and clozapine regularly.  After speaking with      pharmacist, will hold Zyprexa and titrate clozapine to discontinue      until we can rule out neuroleptic malignancy syndrome (NMS).  Fall      was not witnessed, so cannot rule out seizure disorder, although      patient recalls no seizure activity, loss of consciousness, loss of      bladder or bowel function, or history of seizure disorder.  Cannot      exclude other drugs or toxins, so will check a UDS.  Will also hold      her statin and monitor her electrolytes closely.  2. Pigmented urine consistent with #1.  Likely secondary to      myoglobinuria.  Strict input and output to monitor her for      oliguria.  3. Leukocytosis.  Likely secondary to infection.  Patient also has a      left shift.  Chest x-ray within normal limits. Will check urine      blood cultures.  4. Tachycardia and hypertension.  May be secondary to infection and      not taking meds this morning.  Cannot exclude neuroleptic syndrome      __________ instability of his symptoms.  5. Acute and chronic renal failure secondary to rhabdo.  There may be      tubular obstruction due to heme pigmenting cast.  Aggressive      hydration.  Will also check a fractional excretion of sodium (FENa)      and urinalysis to look for pigmented casts.  6. Hypertension, not well controlled.  Patient unable to take meds     this morning as she was in the floor until 1 p.m.  Will restart      home meds of Imdur, Toprol  and Lasix.  7. Diabetes, diet controlled.  Will check A1c.  8. Gastroesophageal reflux disease, Protonix, currently stable.  9. Hyperlipidemia.  Will check a fasting lipid.  Will also hold statin      due to increased CK.  10.Hypothyroidism.  Will check a TSH and restart her Synthroid.  11.History of coronary artery disease, status post stents.  No      complaints of chest pain, nausea or vomiting.  EKG unchanged.  Will      continue her aspirin and Plavix as she did have a stent.  12.FEN/GE.  Eat heart healthy diet.  13.Prophylaxis Protonix and SCDs.  14.Psych:  History of schizoaffective disorder.  Suggest holding psych      meds due to concern for neuroleptic syndrome, but will discuss with      attending. If holding psych meds, need to wean off clozapine.           ______________________________  Ruthe Mannan, M.D.     TA/MEDQ  D:  07/20/2006  T:  07/21/2006  Job:  [id number]

## 2010-10-30 NOTE — Discharge Summary (Signed)
NAME:  Rebecca [name], Rebecca [name]                  ACCOUNT NO.:  [id number]   MEDICAL RECORD NO.:  [id number]          PATIENT TYPE:  INP   LOCATION:  5120                         FACILITY:  [facility]   PHYSICIAN:  Leighton Roach McDiarmid, M.D.DATE OF BIRTH:  September 12, 1940   DATE OF ADMISSION:  07/20/2006  DATE OF DISCHARGE:                               DISCHARGE SUMMARY   PRIMARY CARE PHYSICIAN:  Devra Dopp, M.D.   ANTICIPATED DISCHARGE DATE:  July 29, 2006.   CONSULTATIONS:  1. Renal, Cecille Aver, M.D. of [facility].  2. Physical therapy and occupational therapy.   PROCEDURES AND IMAGING:  1. Plain films of the neck that showed cervical spondylosis from C4-      C6.  2. Left shoulder and arm films that were negative for any fractures.  3. Serial chest x-rays showed resolving edema and effusions and no      evidence of infiltrates.  4. Renal ultrasound showed normal-appearing kidneys without      hydronephrosis.  5. Head CT showed chronic and small vessel ischemic change but no      acute abnormality.   DISCHARGE DIAGNOSES:  1. Rhabdomyolysis secondary to the flu causing weakness and a fall      with a 17-hour stint on the floor.  2. Acute on chronic renal failure secondary to number one (stage III      kidney disease).  3. Elevated LFTs likely secondary to number one, dehydration, and      Statin use.  4. Anemia of chronic disease.  5. Influenza A.  6. Type 2 diabetes, diet-controlled with an A1c of 6.3 on this      admission.  7. Schizoaffective disorder.  8. History of coronary artery disease with an ejection fraction of 75%      in August 2007, and status post stent placement.  9. Hyperlipidemia, though LDL this admission was much below goal.  10.Hypothyroidism.  11.Hypertension.  12.GERD.  13.Osteoarthritis.   DISCHARGE MEDICATIONS:  1. Aranesp 200 mcg IV every 3 weeks.  Note, the patient already has an      appointment set up with [facility] for her  next dose.  2. Tums, one with meals.  3. Calcitriol 0.5 mcg p.o. daily.  4. Aspirin 81 mg p.o. daily.  5. Plavix 75 mg p.o. daily.  6. Imdur 60 mg p.o. daily.  7. Prilosec OTC 20 mg p.o. daily.  8. Synthroid 50 mcg p.o. daily.  9. Toprol XL 100 mg daily.  10.Diltiazem 240 mg p.o. daily.  11.Lasix 20 mg p.o. daily.  12.Clozaril 200 mg p.o. q.h.s.  13.Zyprexa 2.5 mg p.o. daily p.r.n. agitation.  14.Tylenol 650 mg q.6 h. p.r.n. pain.  15.Albuterol inhaler q.4 h. p.r.n. wheezing.  16.The patient is instructed not to restart her Simvastatin or any      NSAIDs.   SIGNIFICANT LABORATORY:  On the anticipated discharge date, the white  blood cell count was 10.8, hemoglobin 8.9, platelet count 440.  The  patient's hemoglobin remained stable around 8-9 the entire admission.  Creatinine initially was 1.9 on admission that  peaked at 3.7 and had  normalized down to 3.11 by discharge which is likely around her new  baseline.  This gave her a calculated GFR of approximately 15.  Calcium  was within normal limits but the phosphorous was mildly elevated at 4.8  and PTH was elevated at 95.  CK on admission was 50,000.  It had  decreased to 612 by anticipated day of discharge.  Total cholesterol was  86, triglycerides 95, [name] 37, LDL 30.  Iron 10.  Ferritin 315.  Hemoccult negative.  Influenza A antigen positive.  Blood cultures were  negative x2.  Urine culture was positive for enterococcus.  On admission  T-Bili 0.6, alk phos 73, AST 706, ALT 175.  These had decreased to AST  of 160 and ALT of 99 throughout the discharge.  TSH 1.832, T4 4.9, T3  55.4.  A1c 6.3.  UDS was negative.  Alcohol was negative.  Acetaminophen  level was negative.   FOLLOWUP:  1. The patient has a followup appointment with [facility] on August 17, 2006, at 10 a.m. for her Aranesp injection.  2. The patient is to follow up with Dr. Providence Lanius on August 09, 2006 at      10 a.m. for just a general hospital followup.    HOSPITAL COURSE:  This is a 70 year old white female with multiple  medical problems admitted after being found on the floor for 17 hours  secondary to weakness and dehydration from influenza A with  rhabdomyolysis (a CK of approximately 50,000), acute on chronic renal  failure with a creatinine that peaked around 3.5 and elevated LFTs.  By  problem:  1. Rhabdomyolysis.  The etiology is likely multifactorial (flu,      struggling on the floor for so long, dehydration, Statin).  Initial      CK was approximately 50,000 and the patient started on aggressive      hydration to protect her kidneys.  A renal consult was obtained as      her creatinine was continuing to climb despite hydration and they      opted to add bicarb to her fluids running at 200 cc/hr and Lasix to      maintain the patient at a euvolemic status.  Her CK quickly      returned to more normal limits at 612 on anticipated day of      discharge.  The patient tolerated the fluid challenge well and      began to auto diurese quickly.  Lasix was decreased to a normal      dose of 20 daily and her IV fluids were stopped once her CK was      approximately 5,000.  2. Acute on chronic renal failure.  The patient's already diseased      kidneys with a baseline of 1.5, were damaged by the elevated CK.      Her creatinine reached a plateau at approximately 3.7 and settled      around 3.1 for 2-3 days prior to discharge.  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  They also recommended a renal panel to be      drawn in two weeks after her discharge at which point the patient      may be able to switch the  Tums to between meals if the phosphate      has normalize, so it would become a calcium supplement instead of      the phosphate binder.  They also recommended a UPEP, SPEP, 24-hour     urine protein  and creatinine, PTH, and a renal panel, again in 4-6      weeks after discharge at which point it may be possible to decrease      the calcitriol to 0.5 mcg every other day if the PTH has      normalized.  In the meantime, the patient is to continue to take      the calcitriol daily along with Tums with each meal as we await her      kidney function to settle at her new baseline.  Tight control of      her diabetes and hypertension will be key to preserve her remaining      kidney function.  Also would avoid all NSAIDs in this patient and      she has been informed of such.  3. Elevated LFTs.  The patient's LFTs were elevated on admission      likely secondary to number one, dehydration, and Statin use.  Her      Statin was discontinued and a fasting lipid panel was at goal, so      no other agent replaced the Statin.  Her LFTs quickly returned to      normal as number one and number two resolved.  The PCP will need to      check a repeat CMP along with a renal panel to ensure that the LFTs      have continued to trend down to normal.  4. Anemia of chronic disease.  The patient was given 1 gram IV InFeD      and then started on every 3-week Aranesp to be continued until her      hemoglobin is approximately 13 or greater.  Her fist appointment at      [facility] has been set up and the remaining appointments will be      deferred to her primary care physician for continuation.  5. Schizoaffective disorder.  The patient was maintained on her      Clozaril and Zyprexa per home dosing, except for a dosing decrease      while her hepatic and renal function were compromised.  The Zyprexa      was weaned off per the patient and her daughter's request and the      patient remained symptom free.  She did not require any p.r.n.      Zyprexa during this admission.  She will need to have a CBC      monitored as previously while on Clozaril.  6. The rest of her medical issues were stable this  admission.  The      only changes to her medication list was discontinuation of the      Statin, the daily Zyprexa, and all NSAIDs, along with the addition      of calcitriol, Tums, and Aranesp.     ______________________________  Lupita Raider, M.D.    ______________________________  Leighton Roach McDiarmid, M.D.    KS/MEDQ  D:  07/29/2006  T:  07/29/2006  Job:  [id number]   cc:   Devra Dopp, MD

## 2010-10-30 NOTE — Discharge Summary (Signed)
[facility]  Patient:    Rebecca [name], Rebecca [name] Visit Number: [id number] MRN: [id number]          Service Type: PSY Location: 400 [id number] [id number] Attending Physician:  Jeanice Lim Dictated by:   Jeanice Lim, M.D. Admit Date:  08/23/2001 Discharge Date: 08/28/2001                             Discharge Summary  IDENTIFYING DATA:  This is a 70 year old _________ female voluntarily admitted with a history of schizoaffective disorder, referred by [facility] via daughter for increased paranoid behavior.  MEDICATIONS:  Atenolol, Zyprexa, Zantac, Synthroid, Premarin, Clozaril.  ALLERGIES:  No known drug allergies.  PHYSICAL EXAMINATION:  Essentially within normal limits.  Neurologically nonfocal.  ROUTINE ADMISSION LABORATORY:  Showed a slightly elevated white count at 12, hemoglobin and hematocrit at 11.4 and 32.3 respectively.  MENTAL STATUS EXAMINATION:  Healthy-appearing Caucasian female with labile mood, anxious, agitated, able to be redirected.  Speech was pressured, disorganized, tangential.  Mood anxious and affect labile.  Thought process disorganized and tangential.  Thought content positive for paranoid ideations, feeling like someone is messing with her.  Cognitively the patient was oriented to person and place.  Judgment and insight poor.  ADMITTING DIAGNOSES: Axis I:    Schizoaffective disorder, bipolar type, acute exacerbation. Axis II:   None. Axis III:  1. Hypothyroidism.            2. Menopausal state. Axis IV:   Moderate, problems with primary support system. Axis V:    26/68.  HOSPITAL COURSE:  The patient was admitted and ordered routine p.r.n. medications, resumed on other medications.  Zyprexa and Clozaril were titrated, targeting psychotic symptoms and the patient reported positive tolerance and response to medication changes.  Her condition at discharge was markedly improved.  Mood is more euthymic, affect  brighter, thought processes more goal-directed.  Thought content negative for overt psychotic symptoms or dangerous ideation.  DISCHARGE MEDICATIONS:  The patient was discharged on medications: 1. Zyprexa 2.5 mg q.a.m. and q.3 p.m. 2. Zyprexa 15 mg q.h.s. 3. Clozaril 25 mg q.a.m. and three q.h.s. 4. Clozaril 100 mg q.h.s. 5. Premarin 1.25 mg q.a.m. 6. Tenormin 50 mg q.a.m. 7. Pepcid 20 mg b.i.d. 8. Levothyroxine 50 mcg q.a.m.  FOLLOW-UP:  The patient will follow up with [facility] on Tuesday, March 18, at 11:30.  DISCHARGE DIAGNOSES: Axis I:    Schizoaffective disorder, bipolar type, acute exacerbation. Axis II:   None. Axis III:  1. Hypothyroidism.            2. Menopausal state. Axis IV:   Moderate, problems with primary support system. Axis V:    Global assessment of functioning on discharge 50. Dictated by:   Jeanice Lim, M.D. Attending Physician:  Jeanice Lim DD:  09/26/01 TD:  09/26/01 Job: [id number]

## 2010-11-03 ENCOUNTER — Telehealth: Payer: Self-pay | Admitting: Family Medicine

## 2010-11-03 NOTE — Telephone Encounter (Signed)
Asking to speak with RN about doing PT for pt at home.

## 2010-11-03 NOTE — Telephone Encounter (Signed)
Spoke with [place] with [facility] she stated that pt is in need of having PT and OT to come in and assist the pt.  She will fax over all of the order information so that Dr. Earnest Bailey can complete.Loralee Pacas [place]

## 2010-11-03 NOTE — Telephone Encounter (Signed)
Noted. Thanks.

## 2010-11-04 ENCOUNTER — Other Ambulatory Visit: Payer: Self-pay | Admitting: Diagnostic Radiology

## 2010-11-04 ENCOUNTER — Ambulatory Visit (INDEPENDENT_AMBULATORY_CARE_PROVIDER_SITE_OTHER): Payer: Medicare Other | Admitting: Family Medicine

## 2010-11-04 ENCOUNTER — Ambulatory Visit
Admission: RE | Admit: 2010-11-04 | Discharge: 2010-11-04 | Disposition: A | Discharge status: Home / Self Care | Payer: Medicare Other | Source: Ambulatory Visit | Attending: Family Medicine | Admitting: Family Medicine

## 2010-11-04 ENCOUNTER — Encounter: Payer: Self-pay | Admitting: Family Medicine

## 2010-11-04 VITALS — BP 128/70 | HR 80 | Temp 98.5°F | Ht 64.0 in | Wt 181.0 lb

## 2010-11-04 DIAGNOSIS — Z8719 Personal history of other diseases of the digestive system: Secondary | ICD-10-CM

## 2010-11-04 DIAGNOSIS — R269 Unspecified abnormalities of gait and mobility: Secondary | ICD-10-CM

## 2010-11-04 DIAGNOSIS — M79673 Pain in unspecified foot: Secondary | ICD-10-CM

## 2010-11-04 DIAGNOSIS — M79609 Pain in unspecified limb: Secondary | ICD-10-CM

## 2010-11-04 MED ORDER — CLOPIDOGREL BISULFATE 75 MG PO TABS: 75.0000 mg | ORAL_TABLET | Freq: Every day | ORAL | Status: DC

## 2010-11-04 MED ORDER — PRAVASTATIN SODIUM 20 MG PO TABS: 20.0000 mg | ORAL_TABLET | Freq: Every day | ORAL | Status: DC

## 2010-11-04 MED ORDER — METOPROLOL SUCCINATE ER 100 MG PO TB24: 100.0000 mg | ORAL_TABLET | Freq: Every day | ORAL | Status: DC

## 2010-11-04 NOTE — Progress Notes (Signed)
  Subjective:    Patient ID: Rebecca [name], female    DOB: 06-04-1941, 70 y.o.   MRN: [id number]  HPI Here for follow-up since last hospitalization for small bowel obstruction  Patient has had multiple admissions for Small bowel obstruction, usually has Acute on chronic renal failure.  SBO and RF resolves with NPO, supportive care.  At the end of this hospitalization, was deconditioned, was discharged to [facility]  Stayed at SNF for 30 days, now with home health nursing and physical and OT.    Has been doing well, no falls, regular bowel regimen.  Reviewed her [facility] nursing med list and hospital discharge med list.  Notes right foot pain for several weeks since at nursing home, no trauma, always able to bear weight.  Sometimes sore in the morning, then improved.  Currently states no pain unless palpated.   No numbness, tingling, weakness. Review of Systems See HPI    Objective:   Physical Exam  Constitutional: She appears well-developed and well-nourished.  Cardiovascular: Normal rate and regular rhythm.   Pulmonary/Chest: Effort normal and breath sounds normal. No respiratory distress. She has no wheezes.  Abdominal: Soft. Bowel sounds are normal. She exhibits no distension. There is no tenderness. There is no rebound and no guarding.  Musculoskeletal:       Right foot with tenderness over base 4th metatarsal, ? mild swelling, no bruising.  Strong plantar flexion and extension with some pain.          Assessment & Plan:

## 2010-11-04 NOTE — Patient Instructions (Signed)
I refilled your medications- metoprolol, pravastatin, plavix Follow-up in 3 months

## 2010-11-04 NOTE — Assessment & Plan Note (Addendum)
Resolved, continue bowel regimen.

## 2010-11-04 NOTE — Assessment & Plan Note (Signed)
Xray does not suggest stress fracture.  Advised conservative tx as it seems to be improving, if does not, will refer  To sports medicine.  May benefit from additional foot analysis, metatarsal pad.

## 2010-11-23 ENCOUNTER — Inpatient Hospital Stay (HOSPITAL_COMMUNITY)
Admission: RE | Admit: 2010-11-23 | Discharge: 2010-11-23 | Disposition: A | Discharge status: Home / Self Care | Payer: Medicare Other | Source: Ambulatory Visit | Attending: Emergency Medicine | Admitting: Emergency Medicine

## 2010-11-23 ENCOUNTER — Telehealth: Payer: Self-pay | Admitting: Family Medicine

## 2010-11-23 ENCOUNTER — Encounter: Payer: Self-pay | Admitting: Family Medicine

## 2010-11-23 DIAGNOSIS — R071 Chest pain on breathing: Secondary | ICD-10-CM

## 2010-11-23 DIAGNOSIS — I10 Essential (primary) hypertension: Secondary | ICD-10-CM

## 2010-11-23 NOTE — Telephone Encounter (Signed)
Her BP is elevated to 160/100 and hands swelling slightly.  Has been taking Furosemide on a PRN basis and wants to know if she can take it now?

## 2010-11-23 NOTE — Telephone Encounter (Signed)
Spoke with Physical Therapist and she reports patient does have some crackles in lungs also. Paged Dr. Earnest Bailey and she states unless patient is having shortness of breath or chest pain she would advise her to come in to be seen tomorrow AM. Spoke with patient and she denies any shortness of breath or chest pain. Over the phone it sounds that patient is wheezing some  And she states she has a little wheezing. Advised her to go to Urgent Care today to be evaluated . She is not sure she will be able to but will try. Went ahead and scheduled office visit in the AM just in case she does not go to ED . She will call to cancel.

## 2010-11-24 ENCOUNTER — Ambulatory Visit (INDEPENDENT_AMBULATORY_CARE_PROVIDER_SITE_OTHER): Payer: Medicare Other | Admitting: Family Medicine

## 2010-11-30 ENCOUNTER — Encounter: Payer: Self-pay | Admitting: Family Medicine

## 2010-11-30 ENCOUNTER — Ambulatory Visit (INDEPENDENT_AMBULATORY_CARE_PROVIDER_SITE_OTHER): Payer: Medicare Other | Admitting: Family Medicine

## 2010-11-30 VITALS — BP 122/60 | HR 78 | Temp 97.1°F | Ht 64.0 in | Wt 180.0 lb

## 2010-11-30 DIAGNOSIS — I10 Essential (primary) hypertension: Secondary | ICD-10-CM

## 2010-11-30 DIAGNOSIS — M545 Low back pain: Secondary | ICD-10-CM

## 2010-11-30 DIAGNOSIS — R413 Other amnesia: Secondary | ICD-10-CM

## 2010-11-30 NOTE — Patient Instructions (Signed)
No changes to your blood pressure regimen- your BP is great today I will put in order to see if home health nursing is covered Your memory is doing well today.  Discuss as a family what options you would consider when it becomes harder to live on your own. Follow-up in 2-3 months for regular checkup, or earlier if you have concerns

## 2010-12-01 NOTE — Assessment & Plan Note (Signed)
Elevated systolic at home, today normal.  Would not want to lower BP further than this.  Advised continued watching sodium, no change in regimen today.

## 2010-12-01 NOTE — Assessment & Plan Note (Signed)
Some mild cognitive impairment noticed, but no dx of dementia.  Baseline MMSE done today.  Will see if patient qualifies for home health for assitance with pill boxes.

## 2010-12-01 NOTE — Progress Notes (Signed)
  Subjective:    Patient ID: Rebecca [name], female    DOB: [birth year], 70 y.o.   MRN: [id number]  HPI HYPERTENSION  BP Readings from Last 3 Encounters:  11/30/10 122/60  11/04/10 128/70  08/06/10 154/82    Hypertension ROS: taking medications as instructed, no medication side effects noted, no TIA's, no chest pain on exertion, no dyspnea on exertion, no swelling of ankles and no intermittent claudication symptoms.   Has has BP checked by her home PT, 160's/100's.  Was seen at urgent care, BP noted to be 162/73, and 158/77.  Her daughter noted recent distress over her son leaving on vacation, and recent increase in sodium diet.  Memory;  Feels memory is gradually declining over the years. No recent change.  Notes having difficulty with taking medicines regularly and has multiple medications.  Patient did not remember that she had recently seen me in the office with her son.  Currently living independently.  No dangerous behaviors noted.  Back pain;  Low back pain, no radiation or weakness.  No significant change.  Takes some tylenol and it relieves it.  Worse when more physically active during the day.     Review of Systems see HPI     Objective:   Physical Exam    GEN: Alert & Oriented, No acute distress CV:  Regular Rate & Rhythm, no murmur Respiratory:  Normal work of breathing, CTAB Abd:  + BS, soft, no tenderness to palpation Ext: no pre-tibial edema  mini-mental status exam 25/30, 9th grade education -1 for date, -2 for delayed recall, -1 for copy design, missed bottom line of both pentagons.    Assessment & Plan:

## 2010-12-02 ENCOUNTER — Telehealth: Payer: Self-pay | Admitting: Family Medicine

## 2010-12-02 NOTE — Telephone Encounter (Signed)
Daughter told [facility] nurse yesterday that Dr Earnest Bailey wanted her to continue with [facility] care.  Nurse is saying that this is covered under her plan and needs orders to continue and also for her PT.  Needs to know the parameters to continue PT as far as her BP goes (what BP readings would limit her PT)  Also needs a copy of meds list. Fax to [phone]

## 2010-12-02 NOTE — Telephone Encounter (Signed)
Joyce Gross with [facility] called to that she believe there was miscommunication regarding who the referral was suppose to go to.  Patient is requesting that [facility] be the agency to provide home health care.  Do not want [facility] due to past experience.  Please reissue referral to [place] and fax to [phone] and put to attn: Caroleen Hamman.  If you have any questions you can reach her at [phone]

## 2010-12-02 NOTE — Telephone Encounter (Signed)
Rebecca [name], Can you please please put a referral in for PT on this patient. HH referral was already faxed in by me to [facility] on 6/19 --[place] Dec

## 2010-12-03 NOTE — Telephone Encounter (Signed)
Faxed referral out to requested home health

## 2010-12-15 ENCOUNTER — Telehealth: Payer: Self-pay | Admitting: Family Medicine

## 2010-12-15 NOTE — Telephone Encounter (Signed)
States that they were to go out for BP/DM checks and found out that [place] has been going out also.  Cannot go b/c both cannot bill medicare. Will have to discontinue services.

## 2010-12-21 NOTE — Telephone Encounter (Signed)
Spoke with pt's daughter and she stated that they would like to continue receiving services thru [facility] (PT and regular nurse for BP and DM monitoring).Laureen Ochs, Viann Shove

## 2010-12-21 NOTE — Telephone Encounter (Signed)
Please call her daughter and let her know this information.  Thanks.

## 2011-01-28 ENCOUNTER — Inpatient Hospital Stay (INDEPENDENT_AMBULATORY_CARE_PROVIDER_SITE_OTHER)
Admission: RE | Admit: 2011-01-28 | Discharge: 2011-01-28 | Disposition: A | Discharge status: Home / Self Care | Payer: Medicaid Other | Source: Ambulatory Visit | Attending: Emergency Medicine | Admitting: Emergency Medicine

## 2011-01-28 DIAGNOSIS — N390 Urinary tract infection, site not specified: Secondary | ICD-10-CM

## 2011-01-28 LAB — POCT URINALYSIS DIP (DEVICE)
Glucose, UA: NEGATIVE mg/dL
Nitrite: NEGATIVE
Specific Gravity, Urine: 1.01 (ref 1.005–1.030)
Urobilinogen, UA: 0.2 mg/dL (ref 0.0–1.0)

## 2011-01-29 ENCOUNTER — Ambulatory Visit: Payer: Medicare Other

## 2011-01-29 ENCOUNTER — Telehealth: Payer: Self-pay | Admitting: Family Medicine

## 2011-01-29 NOTE — Telephone Encounter (Signed)
Please call Rebecca [name]'s daughter back regarding med given for bladder spasms in ED.  It's called Vesacare and she is concerned about any contradictions from it with other meds she is also taking.

## 2011-01-29 NOTE — Telephone Encounter (Signed)
Rebecca [name], Daughter is concerned about this medicine and the reaction it may have with other medicines her mother is on.  -----[place] Dec

## 2011-02-12 NOTE — Telephone Encounter (Signed)
Decided to stop vesicare due to constipation.  No more incontinence.  Daughetr feels it has resolved with treatment of uti.

## 2011-02-18 ENCOUNTER — Telehealth: Payer: Self-pay | Admitting: Family Medicine

## 2011-02-18 NOTE — Telephone Encounter (Signed)
States that [place] [facility] ended their orders 2 1/2 weeks ago and they just found out that the nurse that was supposed to be giving meds refills was no longer working at [facility].  Needs orders to go back there to teach meds refilling to family.

## 2011-02-19 NOTE — Telephone Encounter (Signed)
LVM on Kay's voicemail giving verbal orders, also informed that she can fax over and they will be signed if needed.

## 2011-02-19 NOTE — Telephone Encounter (Signed)
White team, please call and give verbal order, or have them fax order for me to sign.  Thanks.

## 2011-03-16 ENCOUNTER — Other Ambulatory Visit: Payer: Self-pay | Admitting: Family Medicine

## 2011-03-16 MED ORDER — LIDOCAINE 5 % EX PTCH: 3.0000 | MEDICATED_PATCH | CUTANEOUS | Status: DC

## 2011-03-23 ENCOUNTER — Ambulatory Visit (INDEPENDENT_AMBULATORY_CARE_PROVIDER_SITE_OTHER): Payer: Medicare Other | Admitting: *Deleted

## 2011-03-23 DIAGNOSIS — T148XXA Other injury of unspecified body region, initial encounter: Secondary | ICD-10-CM

## 2011-03-23 NOTE — Progress Notes (Signed)
Patient had blood drawn today in left anticubical space. She called and was concerned because she has two bruised areas on left forearm that also have a knot. She was scheduled appointment to come in.  There is only a slight raised  area at venipuncture site.  Dr. Jennette Kettle came in to view areas of concern . Patient thinks it is possible she could have hit arm and caused bruising. She has very fragile skin. Dr. Jennette Kettle advised no cause for worry.

## 2011-03-24 ENCOUNTER — Encounter: Payer: Self-pay | Admitting: Family Medicine

## 2011-03-24 ENCOUNTER — Ambulatory Visit (INDEPENDENT_AMBULATORY_CARE_PROVIDER_SITE_OTHER): Payer: Medicare Other | Admitting: Family Medicine

## 2011-03-24 VITALS — BP 99/61 | HR 76 | Temp 97.9°F | Wt 182.0 lb

## 2011-03-24 DIAGNOSIS — E118 Type 2 diabetes mellitus with unspecified complications: Secondary | ICD-10-CM

## 2011-03-24 DIAGNOSIS — J449 Chronic obstructive pulmonary disease, unspecified: Secondary | ICD-10-CM

## 2011-03-24 DIAGNOSIS — Z23 Encounter for immunization: Secondary | ICD-10-CM

## 2011-03-24 DIAGNOSIS — D649 Anemia, unspecified: Secondary | ICD-10-CM

## 2011-03-24 DIAGNOSIS — E1165 Type 2 diabetes mellitus with hyperglycemia: Secondary | ICD-10-CM

## 2011-03-24 DIAGNOSIS — M545 Low back pain: Secondary | ICD-10-CM

## 2011-03-24 NOTE — Patient Instructions (Signed)
You got your flu shot today.  Your a1c is 6.0: very good.  Your blood pressure is a little on the low side today- 99/61.  This is ok.  If it continues to be low, let me know so we can cut back on your medicines.  I will refill your albuterol.  If you feel like you need it more frequently even when you don't have a cold or are around something with a strong odor, then please let me know.  Try a 24 hour allergy medicine like Zyrtec (cetrizine) to help with congestion in place of your daytime hydroxyzine. Ok to use hydroxyzine at night.  Make a follow-up in 3 months (January).  We will get fasting bloodwork then.

## 2011-03-24 NOTE — Assessment & Plan Note (Signed)
in

## 2011-03-24 NOTE — Progress Notes (Signed)
  Subjective:    Patient ID: Rebecca [name], female    DOB: 10/17/1940, 70 y.o.   MRN: [id number]  HPI DIABETES  Taking and tolerating: diet controlled Fasting blood sugars:140  Hypoglycemic symptoms: nono Visual problems: no Monitoring feet: yes Numbness/Tingling: no  Diabetic Labs:  Lab Results  Component Value Date   HGBA1C 6.0 03/24/2011   HGBA1C 6.2 07/09/2010   HGBA1C 6.5 01/27/2010   Lab Results  Component Value Date   [name] 74 05/25/2010   CREATININE 1.50* 09/16/2010   Last microalbumin: No results found for this basename: MICROALBUR, [id number]   HYPERTENSION  BP Readings from Last 3 Encounters:  03/24/11 99/61  11/30/10 122/60  11/04/10 128/70    Hypertension ROS: taking medications as instructed, patient does not perform home BP monitoring, no TIA's, no chest pain on exertion, no dyspnea on exertion, no swelling of ankles and no intermittent claudication symptoms.    "congestion": Pat she denies any chest pain or chest tightness. She requests a refill for albuterol today. She states that she has used intermittently more in the past month. She denies any dyspnea at rest or during exertion. She states she notices what she describes as congestion when she is around strong odors or when she has a cold. She denies any chest pain or chest tightness.     Review of Systems See above    Objective:   Physical Exam GEN: Alert & Oriented, No acute distress CV:  Regular Rate & Rhythm, no murmur Respiratory:  Normal work of breathing, CTAB Abd:  + BS, soft, no tenderness to palpation Ext: no pre-tibial edema See DM foot exam       Assessment & Plan:

## 2011-03-24 NOTE — Assessment & Plan Note (Signed)
Will refill albuterol for intermittent use.  Advised to follow-up if needing to use it more regualry.

## 2011-03-24 NOTE — Assessment & Plan Note (Signed)
Doing well.  a1c today at goal.

## 2011-03-26 MED ORDER — ALBUTEROL SULFATE HFA 108 (90 BASE) MCG/ACT IN AERS: 1.0000 | INHALATION_SPRAY | RESPIRATORY_TRACT | Status: DC | PRN

## 2011-03-26 NOTE — Progress Notes (Signed)
Addended by: Macy Mis on: 03/26/2011 03:40 PM   Modules accepted: Orders

## 2011-03-30 ENCOUNTER — Telehealth: Payer: Self-pay | Admitting: *Deleted

## 2011-03-30 NOTE — Telephone Encounter (Signed)
Pt inquiring about inhaler that was to be sent to [facility] stated it was not there .Rebecca [name]

## 2011-03-31 NOTE — Telephone Encounter (Signed)
Pt stated that [facility] has Rx for inhaler and is awaiting for them to deliver it to her. She stated that she is doing all right.Loralee Pacas [place]

## 2011-04-09 ENCOUNTER — Other Ambulatory Visit: Payer: Self-pay | Admitting: Family Medicine

## 2011-04-09 MED ORDER — POLYETHYLENE GLYCOL 3350 17 GM/SCOOP PO POWD: 17.0000 g | Freq: Every day | ORAL | Status: DC

## 2011-04-23 ENCOUNTER — Other Ambulatory Visit: Payer: Self-pay | Admitting: Family Medicine

## 2011-04-23 MED ORDER — METOPROLOL SUCCINATE ER 100 MG PO TB24: 100.0000 mg | ORAL_TABLET | Freq: Every day | ORAL | Status: DC

## 2011-04-23 MED ORDER — PANTOPRAZOLE SODIUM 40 MG PO TBEC: 40.0000 mg | DELAYED_RELEASE_TABLET | Freq: Every day | ORAL | Status: DC

## 2011-04-23 MED ORDER — DILTIAZEM HCL 180 MG PO CP24: 180.0000 mg | ORAL_CAPSULE | Freq: Every day | ORAL | Status: DC

## 2011-04-23 MED ORDER — CLOPIDOGREL BISULFATE 75 MG PO TABS: 75.0000 mg | ORAL_TABLET | Freq: Every day | ORAL | Status: DC

## 2011-04-23 NOTE — Telephone Encounter (Signed)
Please let patient know that we did not receive anything from [facility].  Can you please call [facility] and make sure they have our correct info as we never received anything and clarify with them.  I have refilled the medications that I prescribe for her that seem to be due including the plavix.   Thanks.

## 2011-04-23 NOTE — Telephone Encounter (Signed)
Dr. Earnest Bailey, I spoke with this patient's daughter and she informed me that patient is completely out of her Plavix and that pharmacy has been trying to reach [place] for a week. I also left message with her daughter for her to call back so I can personally find out which others meds she is exactly talking about refilling. I told her daughter that I need to find out which ones to determine if patient is needing to come back. She was anting [place] just to authorize them so they can be refilled and I explained to her that we cannot just do that. ------Huntley Dec

## 2011-04-23 NOTE — Telephone Encounter (Signed)
Spoke with patient's daughter and informed her of rx's filled

## 2011-04-23 NOTE — Telephone Encounter (Signed)
Rebecca [name] is calling because she needs a refill on her Plavix.  She said her Pharmacy has sent refill requests days ago, but has not heard anything back.  She said that she could use refills on all of her medications.

## 2011-04-29 IMAGING — CR DG ANKLE COMPLETE 3+V*L*
3 series · 3 of 3 positions shown · non-contrast
Comparison: None

CLINICAL DATA: Pain, no trauma

LEFT ANKLE COMPLETE - 3+ VIEW

[t ankle joint ap left]
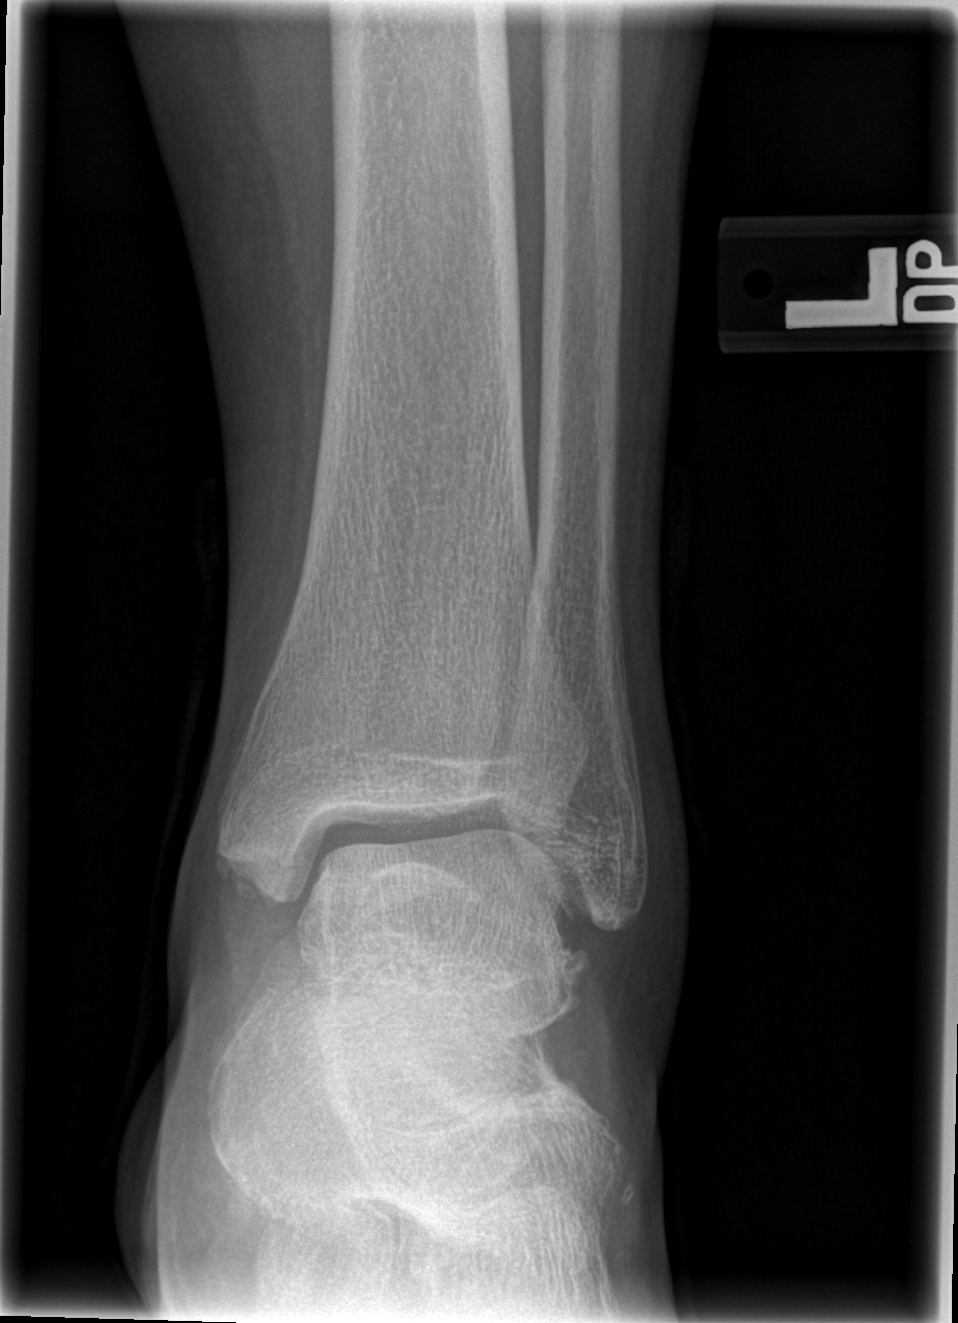

[t ankle joint oblique left]
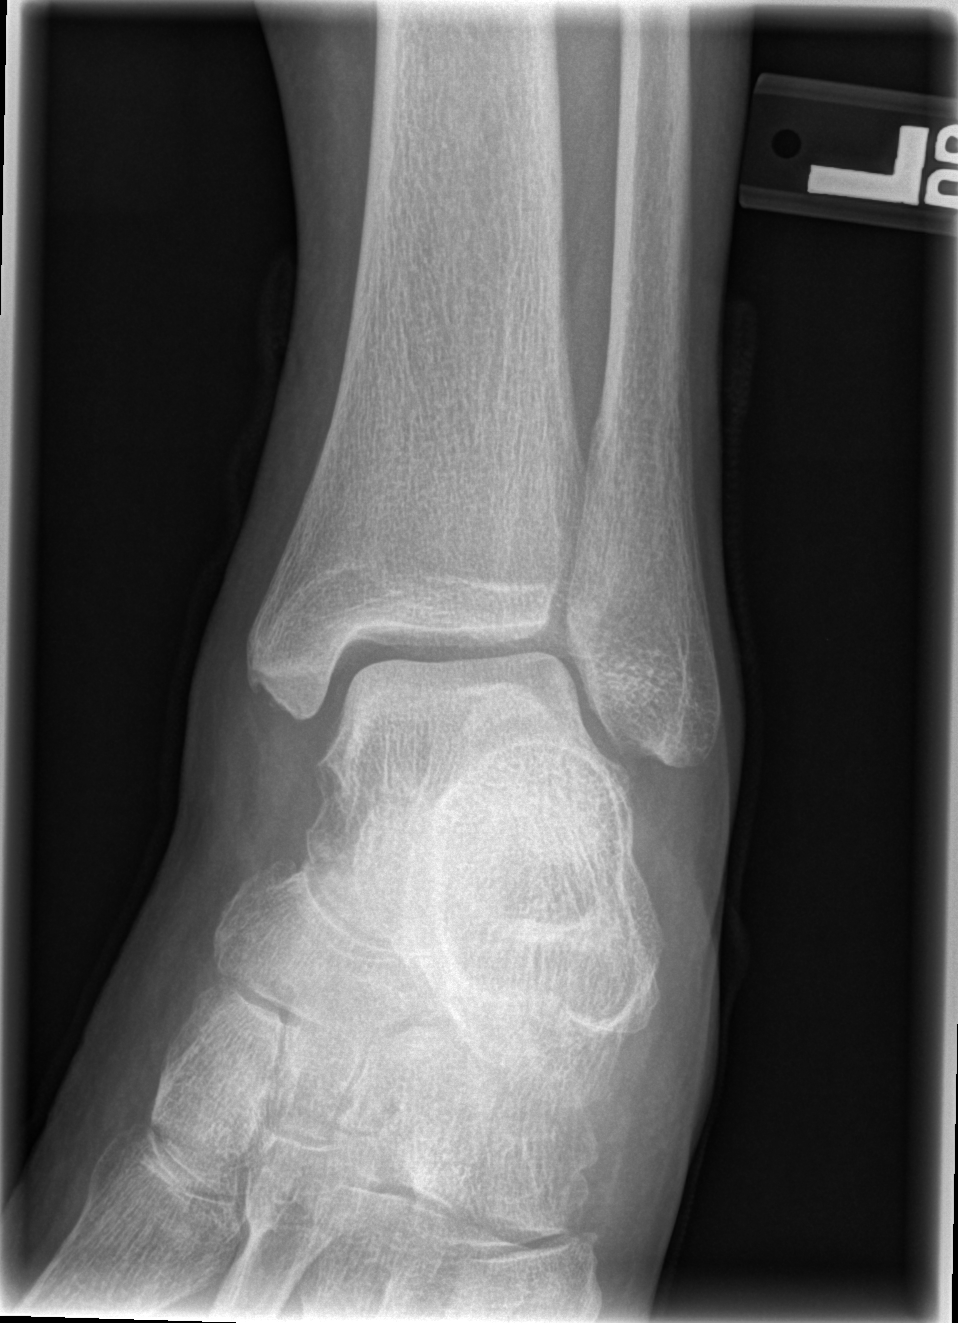

[t ankle joint lat left]
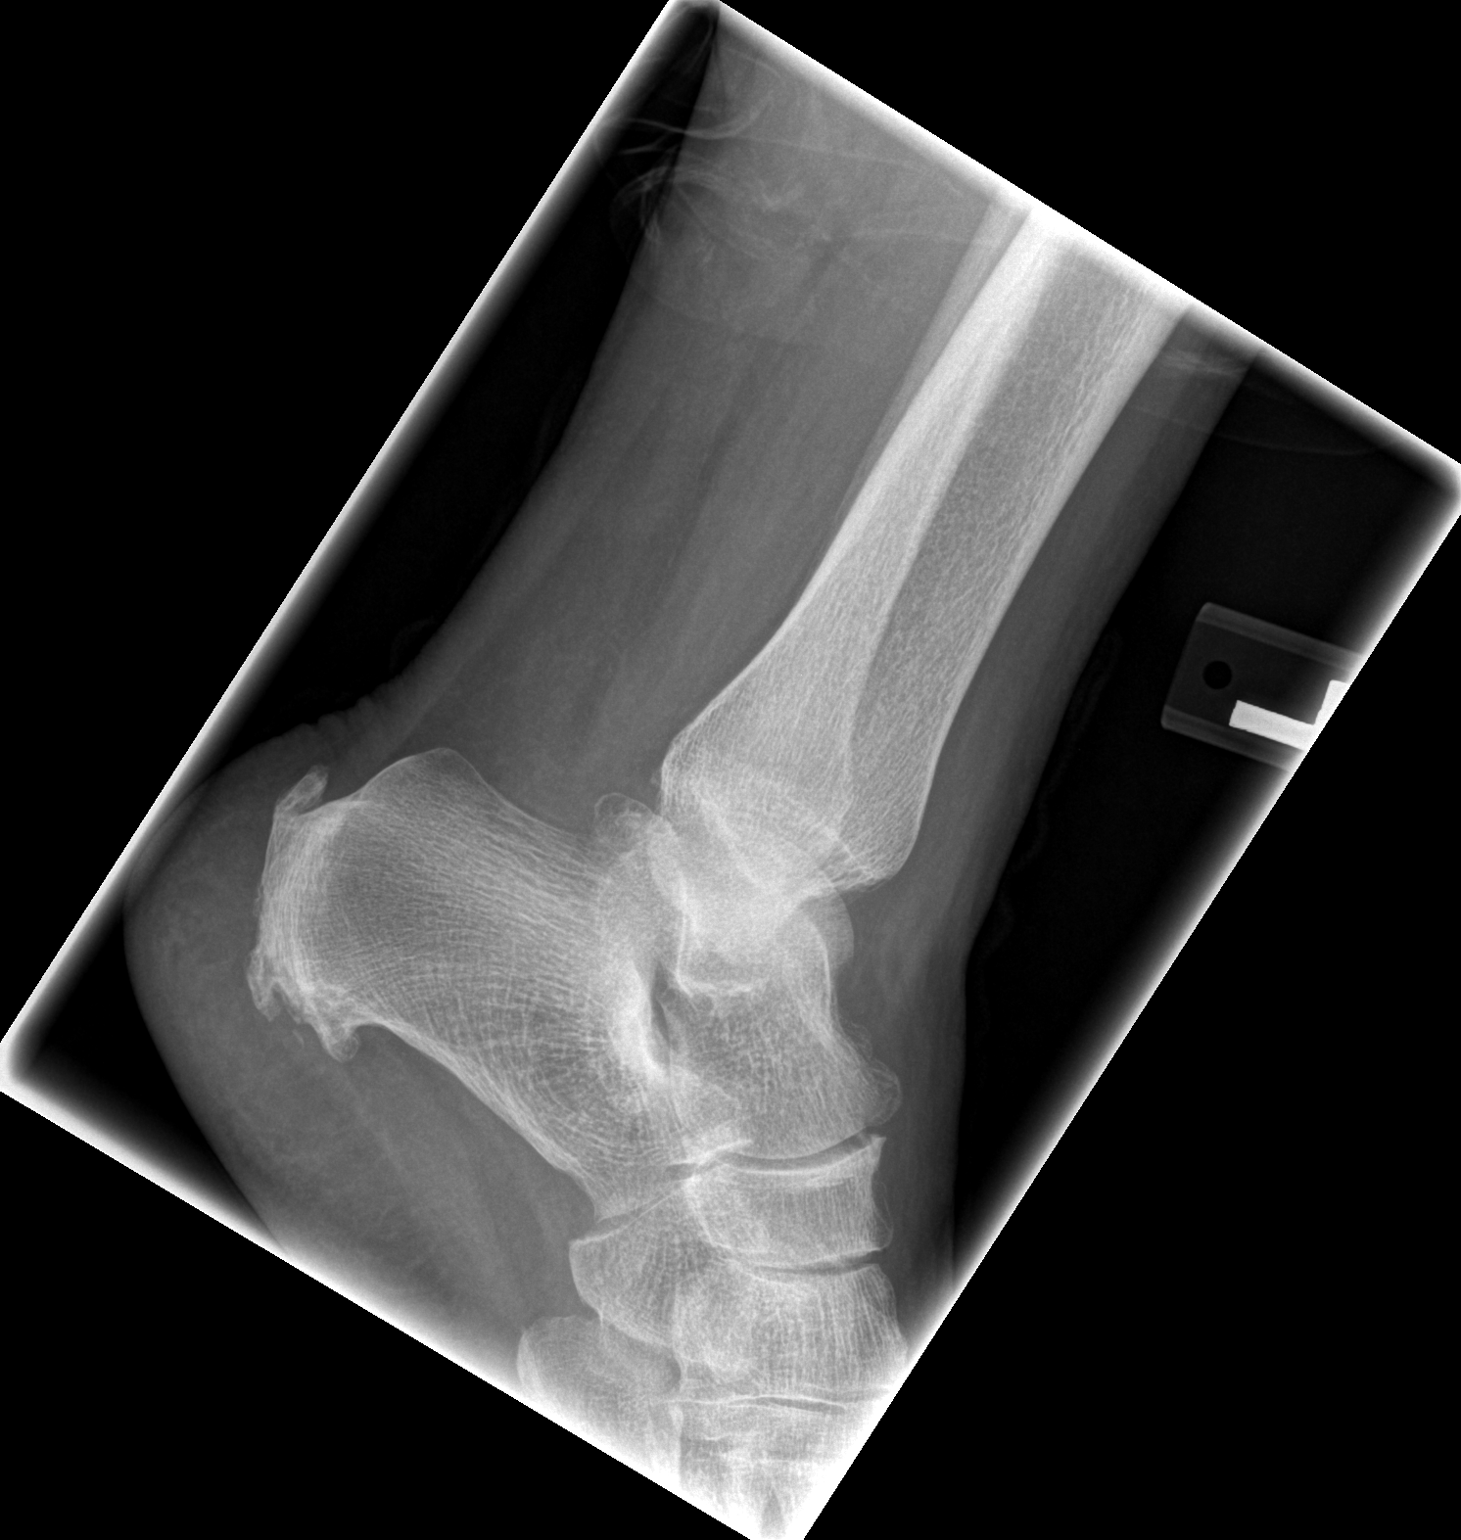

[3 of 3 positions shown; findings below may reference images not displayed]

FINDINGS: No acute bony abnormality is seen.  The ankle joint is
normal.  There are degenerative changes with calcaneal spurs
present with mild degenerative change in the midfoot as well.
IMPRESSION: Degenerative change.  No acute abnormality.

## 2011-04-29 IMAGING — CR DG FOOT COMPLETE 3+V*L*
3 series · 3 of 3 positions shown · non-contrast
Comparison: None

CLINICAL DATA: Left foot ankle pain, metatarsal pain, history
rheumatoid arthritis

LEFT FOOT - COMPLETE 3+ VIEW

[t foot ap left]
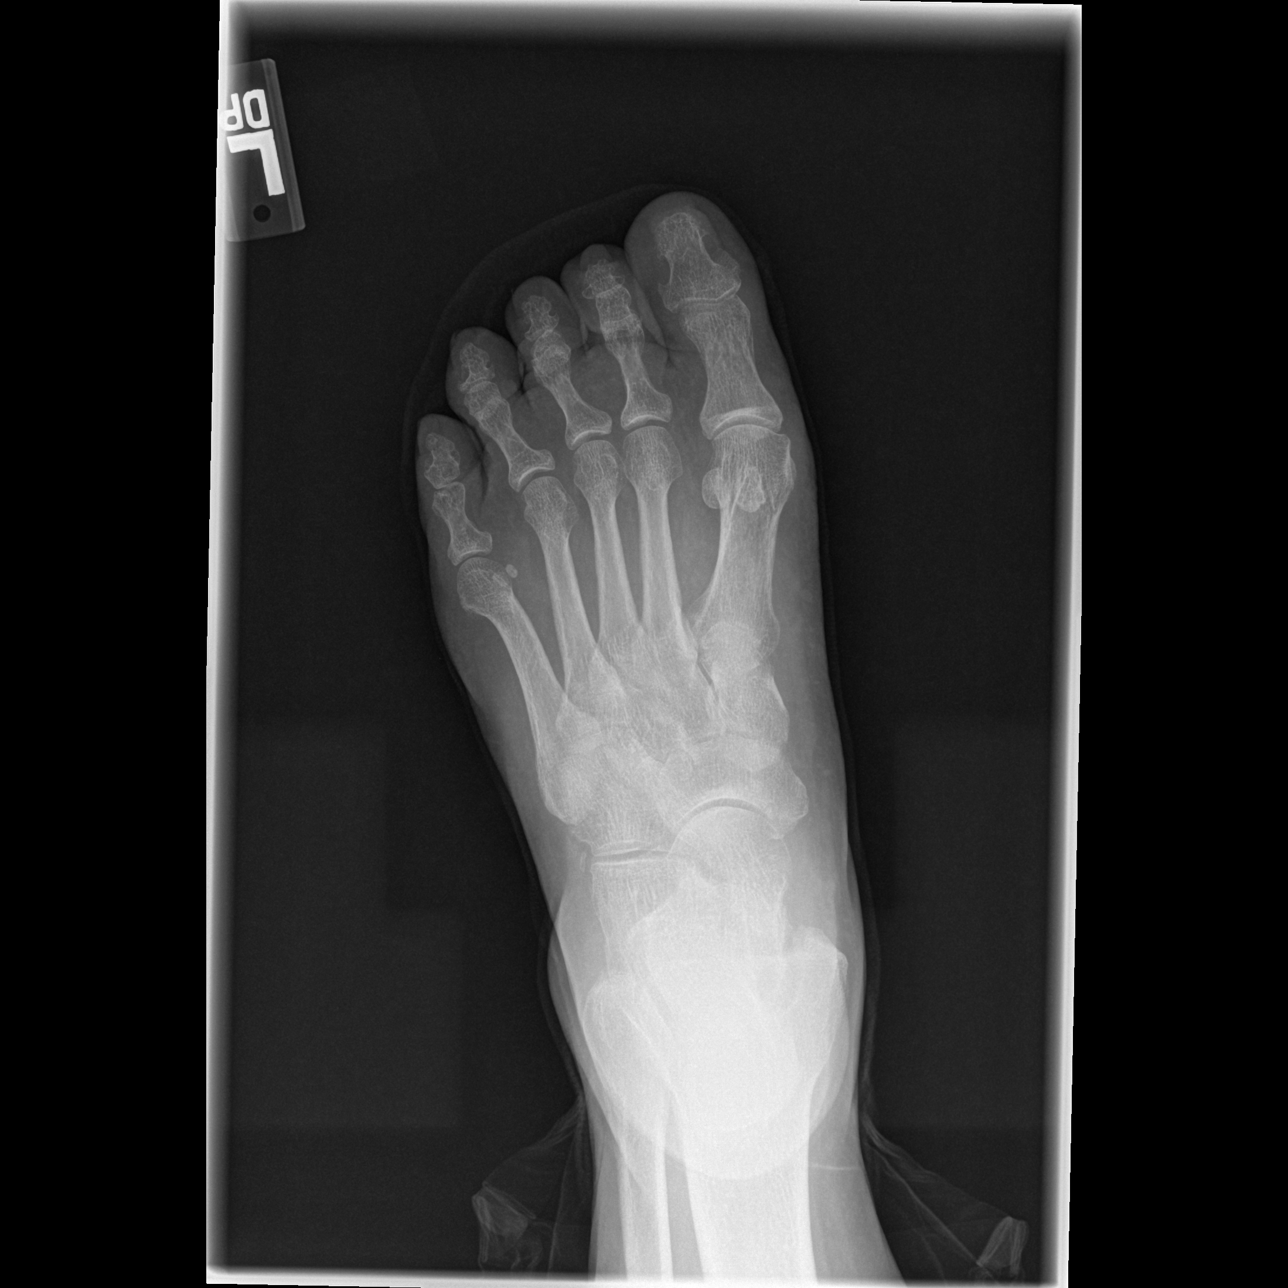

[t foot oblique left]
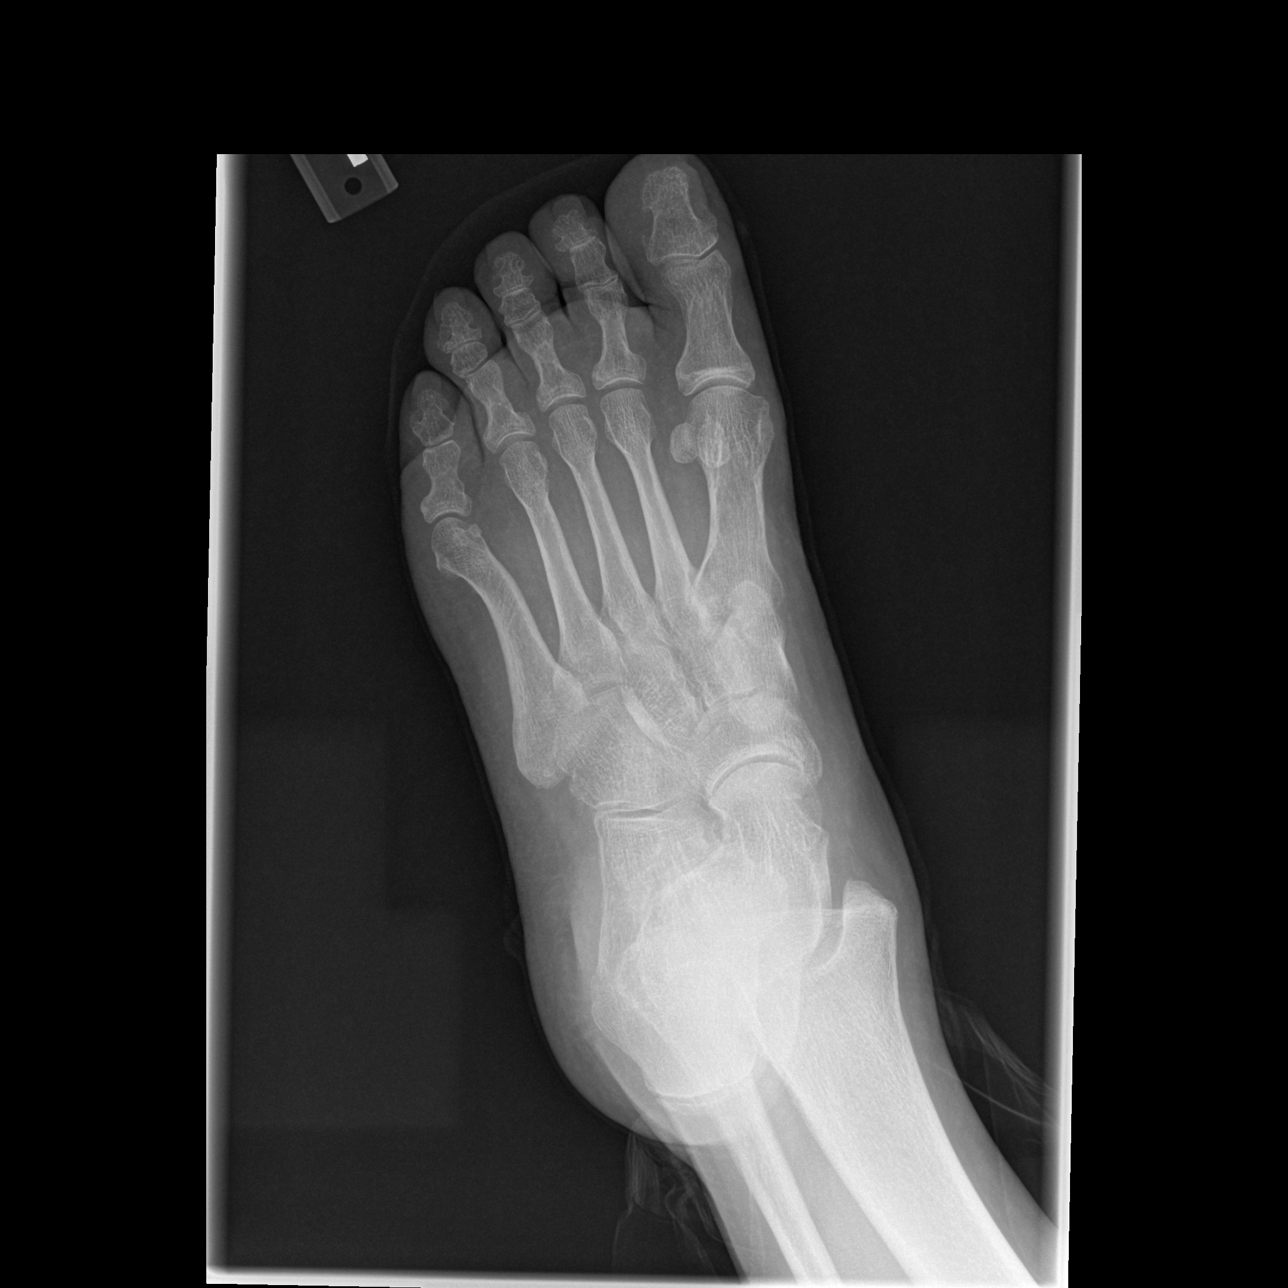

[t foot lat left]
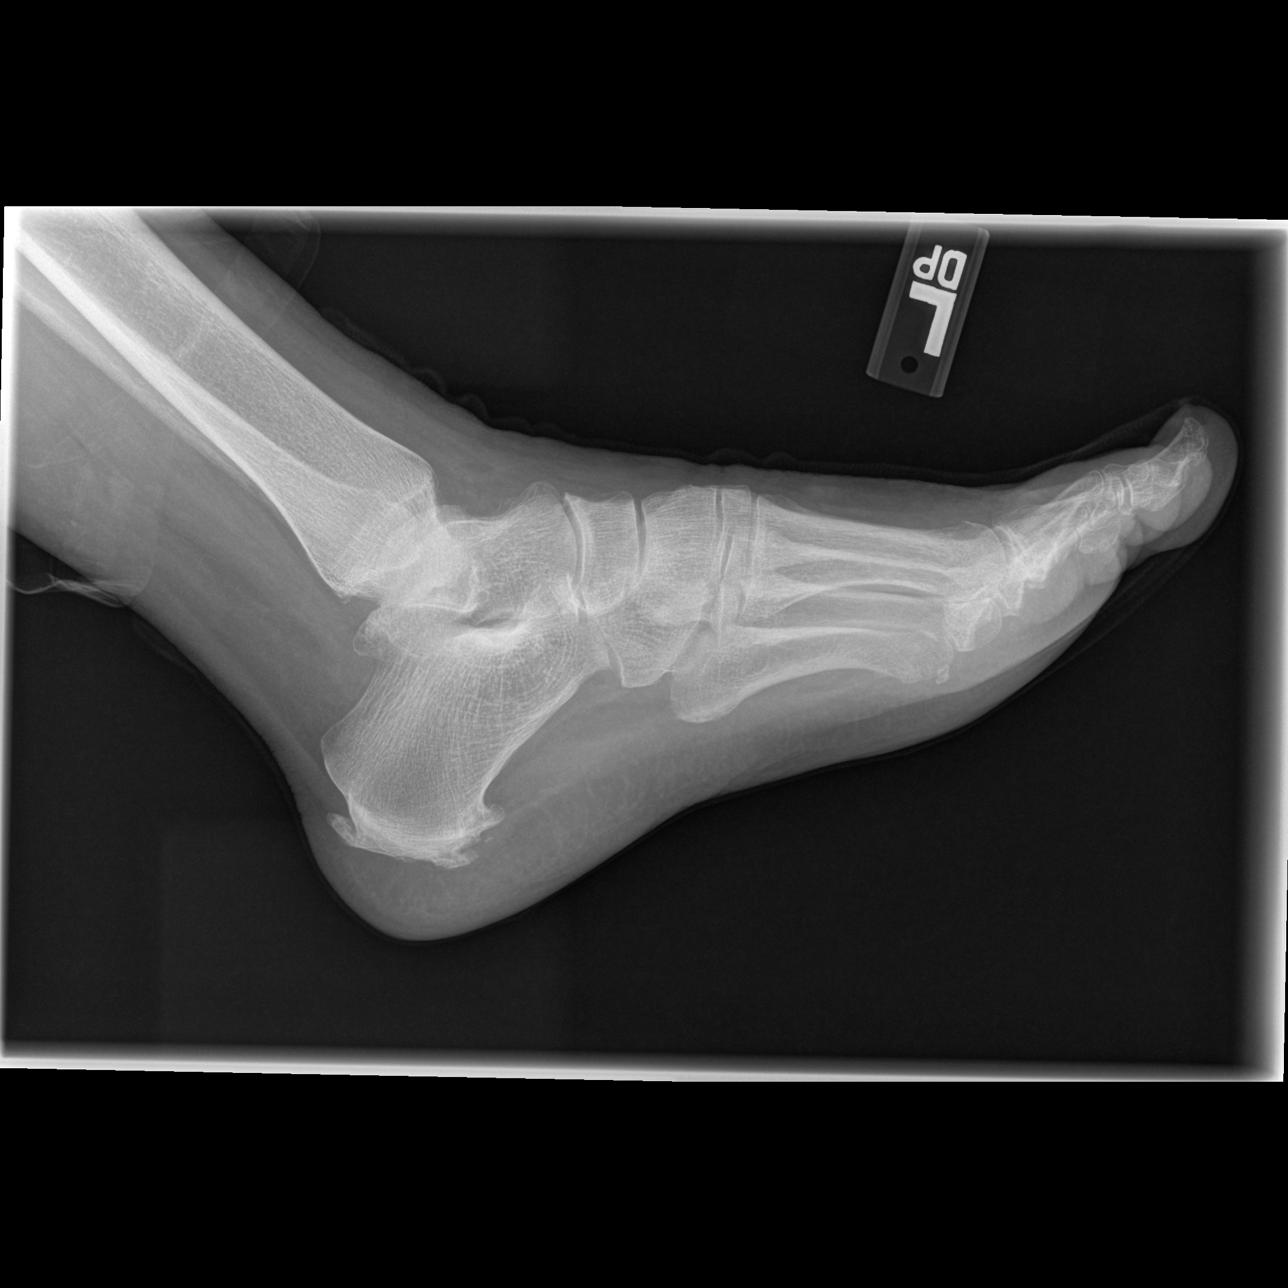

[3 of 3 positions shown; findings below may reference images not displayed]

FINDINGS: Bones appear diffusely demineralized.
Joint spaces preserved.
Plantar and Achilles insertion calcaneal spur formation.
No acute fracture, dislocation or bone destruction.
Specifically, no focal metatarsal abnormality identified.
IMPRESSION: No acute abnormalities.

## 2011-05-21 ENCOUNTER — Other Ambulatory Visit: Payer: Self-pay | Admitting: Family Medicine

## 2011-05-21 NOTE — Telephone Encounter (Signed)
Refill request

## 2011-06-04 ENCOUNTER — Ambulatory Visit (INDEPENDENT_AMBULATORY_CARE_PROVIDER_SITE_OTHER): Payer: Medicare Other | Admitting: Family Medicine

## 2011-06-04 ENCOUNTER — Encounter: Payer: Self-pay | Admitting: Family Medicine

## 2011-06-04 VITALS — BP 128/78 | HR 93 | Temp 98.1°F | Ht 64.0 in | Wt 183.5 lb

## 2011-06-04 DIAGNOSIS — R300 Dysuria: Secondary | ICD-10-CM

## 2011-06-04 DIAGNOSIS — N390 Urinary tract infection, site not specified: Secondary | ICD-10-CM | POA: Insufficient documentation

## 2011-06-04 LAB — POCT URINALYSIS DIPSTICK
Glucose, UA: NEGATIVE
Nitrite, UA: NEGATIVE
Urobilinogen, UA: 0.2
pH, UA: 6

## 2011-06-04 MED ORDER — CEPHALEXIN 500 MG PO CAPS: 500.0000 mg | ORAL_CAPSULE | Freq: Two times a day (BID) | ORAL | Status: AC

## 2011-06-04 NOTE — Progress Notes (Signed)
  Subjective:    Patient ID: Rebecca [name], female    DOB: 02-07-1941, 70 y.o.   MRN: [id number]  HPI Here for 1 week of dysuria  DYSURIA  Onset: 1 week   Worsening: got better with taking her daughters abx (unknown for 4-5 days) now worsening.    Symptoms Urgency: no  Frequency: no  Hesitancy: yes  Hematuria: no  Flank Pain: no  Fever: no   Highest Temp:n/a Nausea/Vomiting: no  Pregnant: no STD exposure/history: no Discharge: no Irritants: no Rash: no  Red Flags  : (Risk Factors for Complicated UTI) Recent Antibiotic Usage (last 30 days): yes, her daughter's unknown 5 day course  Symptoms lasting more than seven (7) days: yes  More than 3 UTI's last 12 months: no  PMH of  1. DM: yes 2. Renal Disease/Calculi: no 3. Urinary Tract Abnormality: no  4. Instrumentation/Trauma: no 5. Immunosuppression: no     Review of Systems     Objective:   Physical Exam        Assessment & Plan:

## 2011-06-04 NOTE — Patient Instructions (Signed)

## 2011-06-04 NOTE — Assessment & Plan Note (Signed)
Will culture- may have been partially treated with unknown abx.  Will treat with keflex and follow-up on sensitivities.

## 2011-06-07 LAB — URINE CULTURE: Colony Count: >=100000

## 2011-06-09 ENCOUNTER — Telehealth: Payer: Self-pay | Admitting: Family Medicine

## 2011-06-09 DIAGNOSIS — N390 Urinary tract infection, site not specified: Secondary | ICD-10-CM

## 2011-06-09 MED ORDER — SULFAMETHOXAZOLE-TRIMETHOPRIM 800-160 MG PO TABS: 1.0000 | ORAL_TABLET | Freq: Two times a day (BID) | ORAL | Status: AC

## 2011-06-09 NOTE — Assessment & Plan Note (Signed)
Update: resistant to 1st gen cephalosprorin- patient still symptomatic- will treat with bactrim.

## 2011-06-09 NOTE — Telephone Encounter (Signed)
Changed keflex to bactrim- discussed with patient

## 2011-06-16 DIAGNOSIS — F259 Schizoaffective disorder, unspecified: Secondary | ICD-10-CM | POA: Diagnosis not present

## 2011-06-21 DIAGNOSIS — Z79899 Other long term (current) drug therapy: Secondary | ICD-10-CM | POA: Diagnosis not present

## 2011-06-22 ENCOUNTER — Encounter: Payer: Self-pay | Admitting: Family Medicine

## 2011-07-16 DIAGNOSIS — Z79899 Other long term (current) drug therapy: Secondary | ICD-10-CM | POA: Diagnosis not present

## 2011-07-20 ENCOUNTER — Telehealth: Payer: Self-pay | Admitting: Family Medicine

## 2011-07-20 NOTE — Telephone Encounter (Signed)
Returned call to patient's daughter.  Daughter called Dr. Kathrene Bongo @ [facility] called in refill of Septra for UTI since patient was unable to come in for an office visit due to "extenuating circumstances."  Patient will call back if not better after taking abx.  Gaylene Brooks, RN

## 2011-07-20 NOTE — Telephone Encounter (Signed)
Pt had UTI back in December and then came down with Noro virus a couple of weeks ago.  She is now feeling like the UTI is back, wants to know if something can be called in.  I told her that she would need to be seen but her problem is that she is flat on her back and she can't bring her and she is afraid to put her in a cab to come here and also didn't want to expose her to any other viruses out there.  Would like to talk to nurse about what can be done.

## 2011-07-23 ENCOUNTER — Telehealth: Payer: Self-pay | Admitting: Family Medicine

## 2011-07-23 DIAGNOSIS — N390 Urinary tract infection, site not specified: Secondary | ICD-10-CM | POA: Diagnosis not present

## 2011-07-23 DIAGNOSIS — N184 Chronic kidney disease, stage 4 (severe): Secondary | ICD-10-CM | POA: Diagnosis not present

## 2011-07-23 NOTE — Telephone Encounter (Signed)
Returned call to patient's daughter.  Patient unable to urinate and abdomen is firm.  Was started on Septra 3 days ago for UTI by nephrologist.  Informed that patient needs office visit to be evaluated.  Can take patient to urgent care or ED to be evaluated.  Daughter called nephrologist for an office visit and is awaiting call back from their office.

## 2011-07-23 NOTE — Telephone Encounter (Signed)
Called patient's daughter and she spoke with nurse at nephrologist's office.  Dr. Kathrene Bongo ordered UA and labs and patient has follow-up appt on Monday depending on outcome of labwork.  Gaylene Brooks, RN

## 2011-07-23 NOTE — Telephone Encounter (Signed)
Daughter is calling because the patient is not urinating and her tummy is really hard and they need advice.  The patient is really feeling unwell.

## 2011-07-27 DIAGNOSIS — R300 Dysuria: Secondary | ICD-10-CM | POA: Diagnosis not present

## 2011-07-27 DIAGNOSIS — R339 Retention of urine, unspecified: Secondary | ICD-10-CM | POA: Diagnosis not present

## 2011-07-30 ENCOUNTER — Other Ambulatory Visit: Payer: Self-pay | Admitting: Family Medicine

## 2011-07-30 DIAGNOSIS — Z1231 Encounter for screening mammogram for malignant neoplasm of breast: Secondary | ICD-10-CM

## 2011-08-01 IMAGING — CR DG ABDOMEN ACUTE W/ 1V CHEST
3 series · 3 of 3 positions shown · non-contrast
Comparison: Chest radiographs 07/31/2006. Abdominal CT 06/16/2002.

CLINICAL DATA: Urinary retention.  Diarrhea.

ACUTE ABDOMEN SERIES (ABDOMEN 2 VIEW & CHEST 1 VIEW)

[w chest pa]
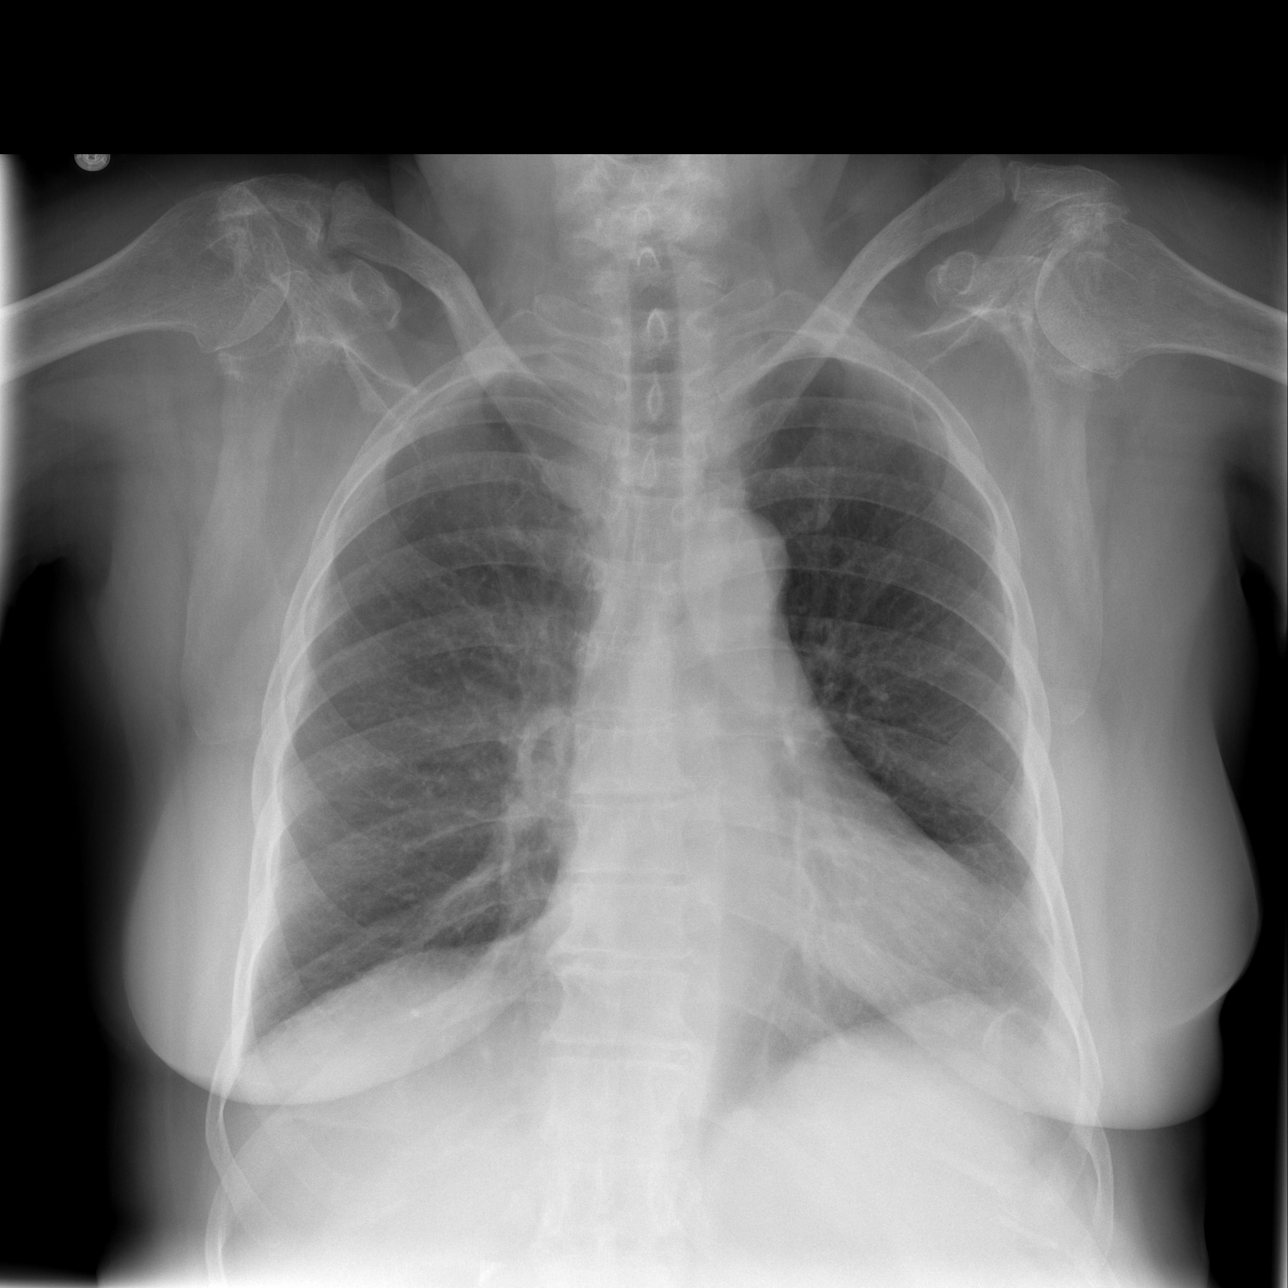

[w abdomen upright]
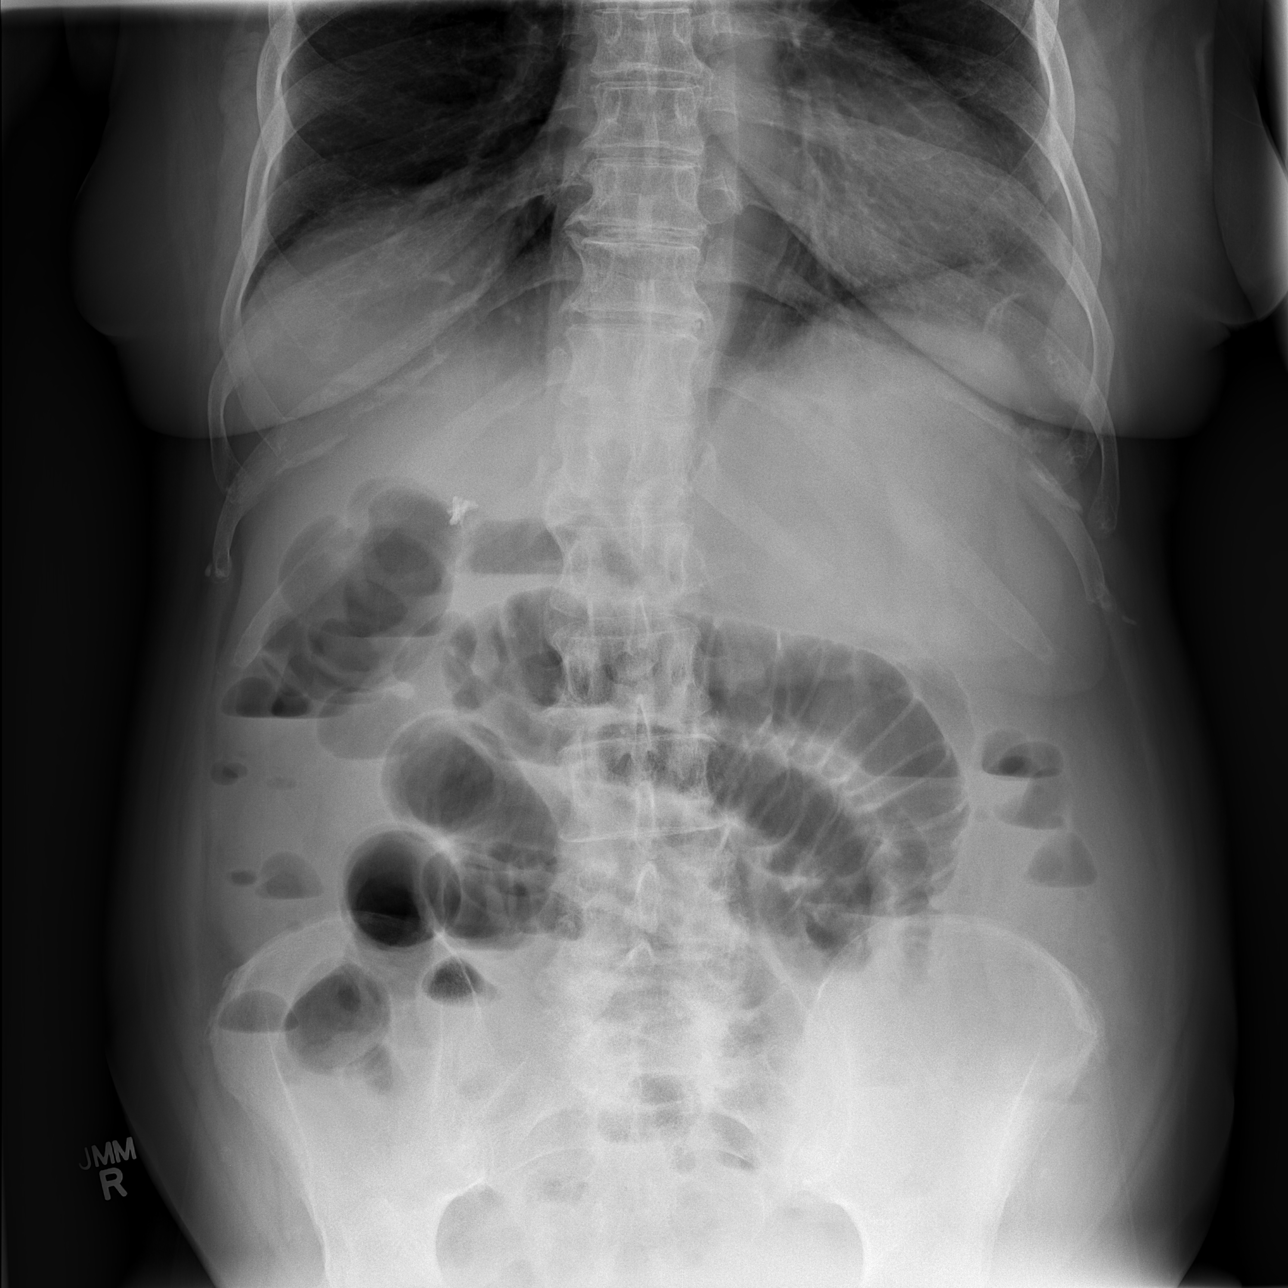

[t abdomen supine]
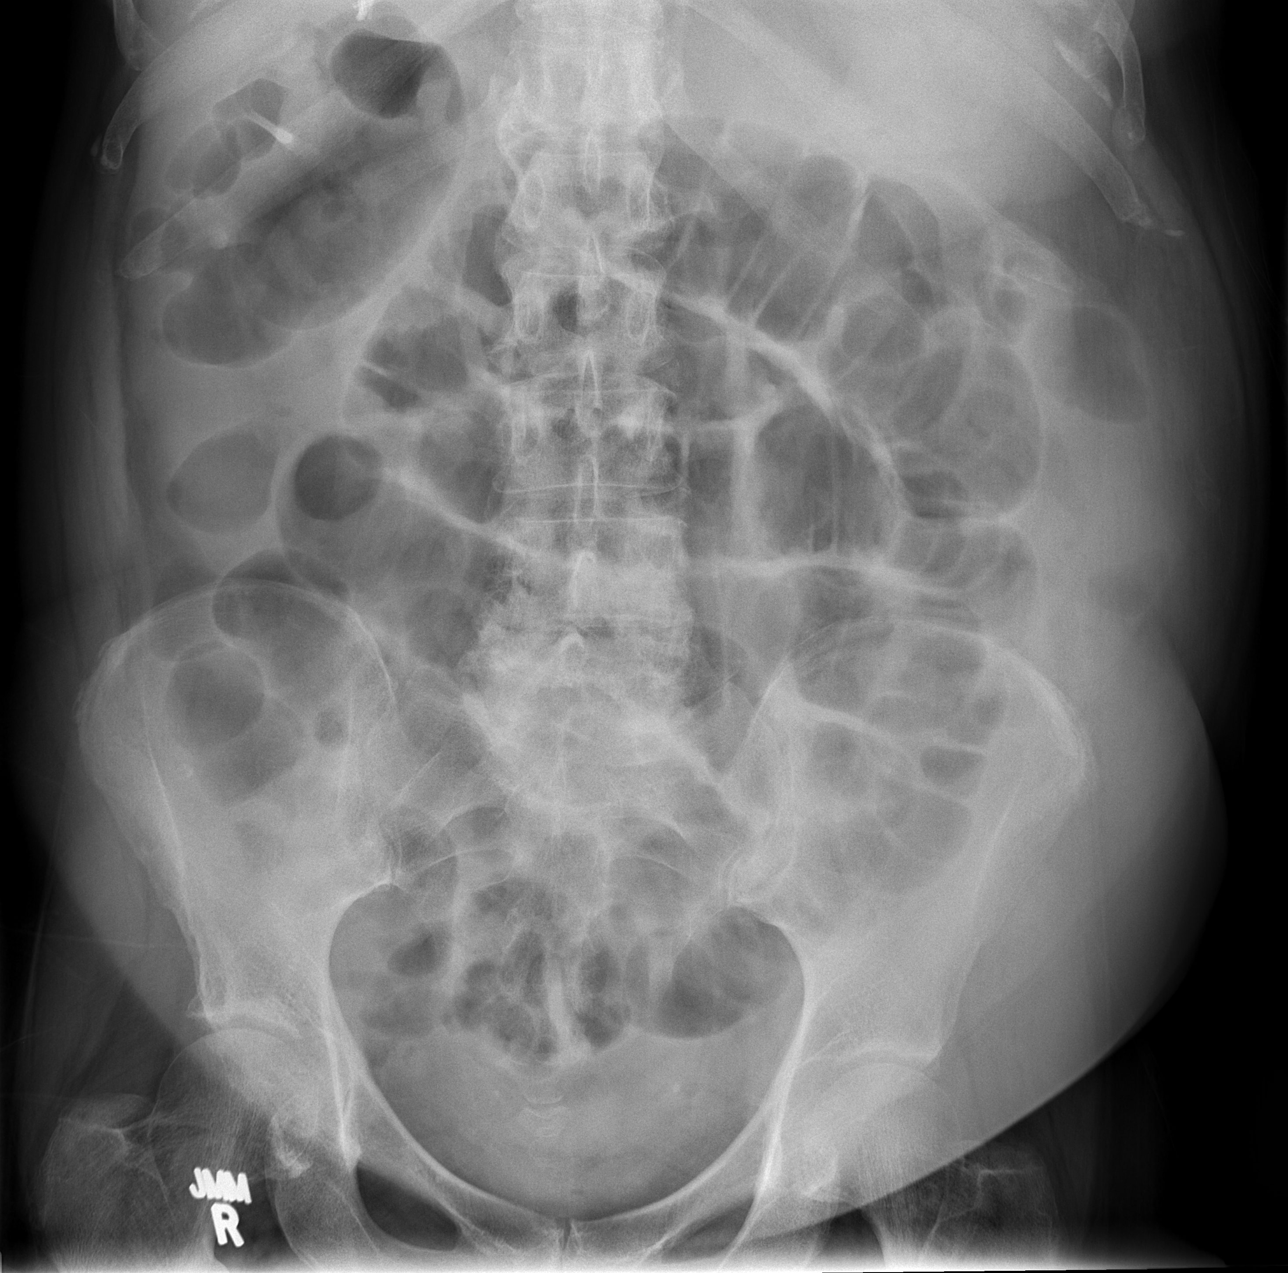

[3 of 3 positions shown; findings below may reference images not displayed]

FINDINGS: The heart size and mediastinal contours are stable.  The
lungs are clear.  There is no pleural effusion.  The subacromial
space of both shoulders is narrowed, suggesting bilateral rotator
cuff tears.

 There is moderate diffuse small bowel distension.  Some air is
present within a relatively decompressed colon.  There are
scattered small and large bowel air fluid levels.  There is no free
intraperitoneal air.  Cholecystectomy clips are noted.  There is no
suspicious abdominal calcification.
IMPRESSION: 1.  Dilatation of the small bowel in excess of the colon with
scattered air fluid levels.  Findings are nonspecific in the
setting of diarrhea; early or partial small-bowel obstruction is
not excluded.
2.  No pneumoperitoneum.
3.  No acute cardiopulmonary process.

## 2011-08-02 IMAGING — CR DG CHEST 2V
2 series · 2 of 2 positions shown · non-contrast
Comparison: 07/31/2006

CLINICAL DATA: Cough.

CHEST - 2 VIEW

[w chest pa]
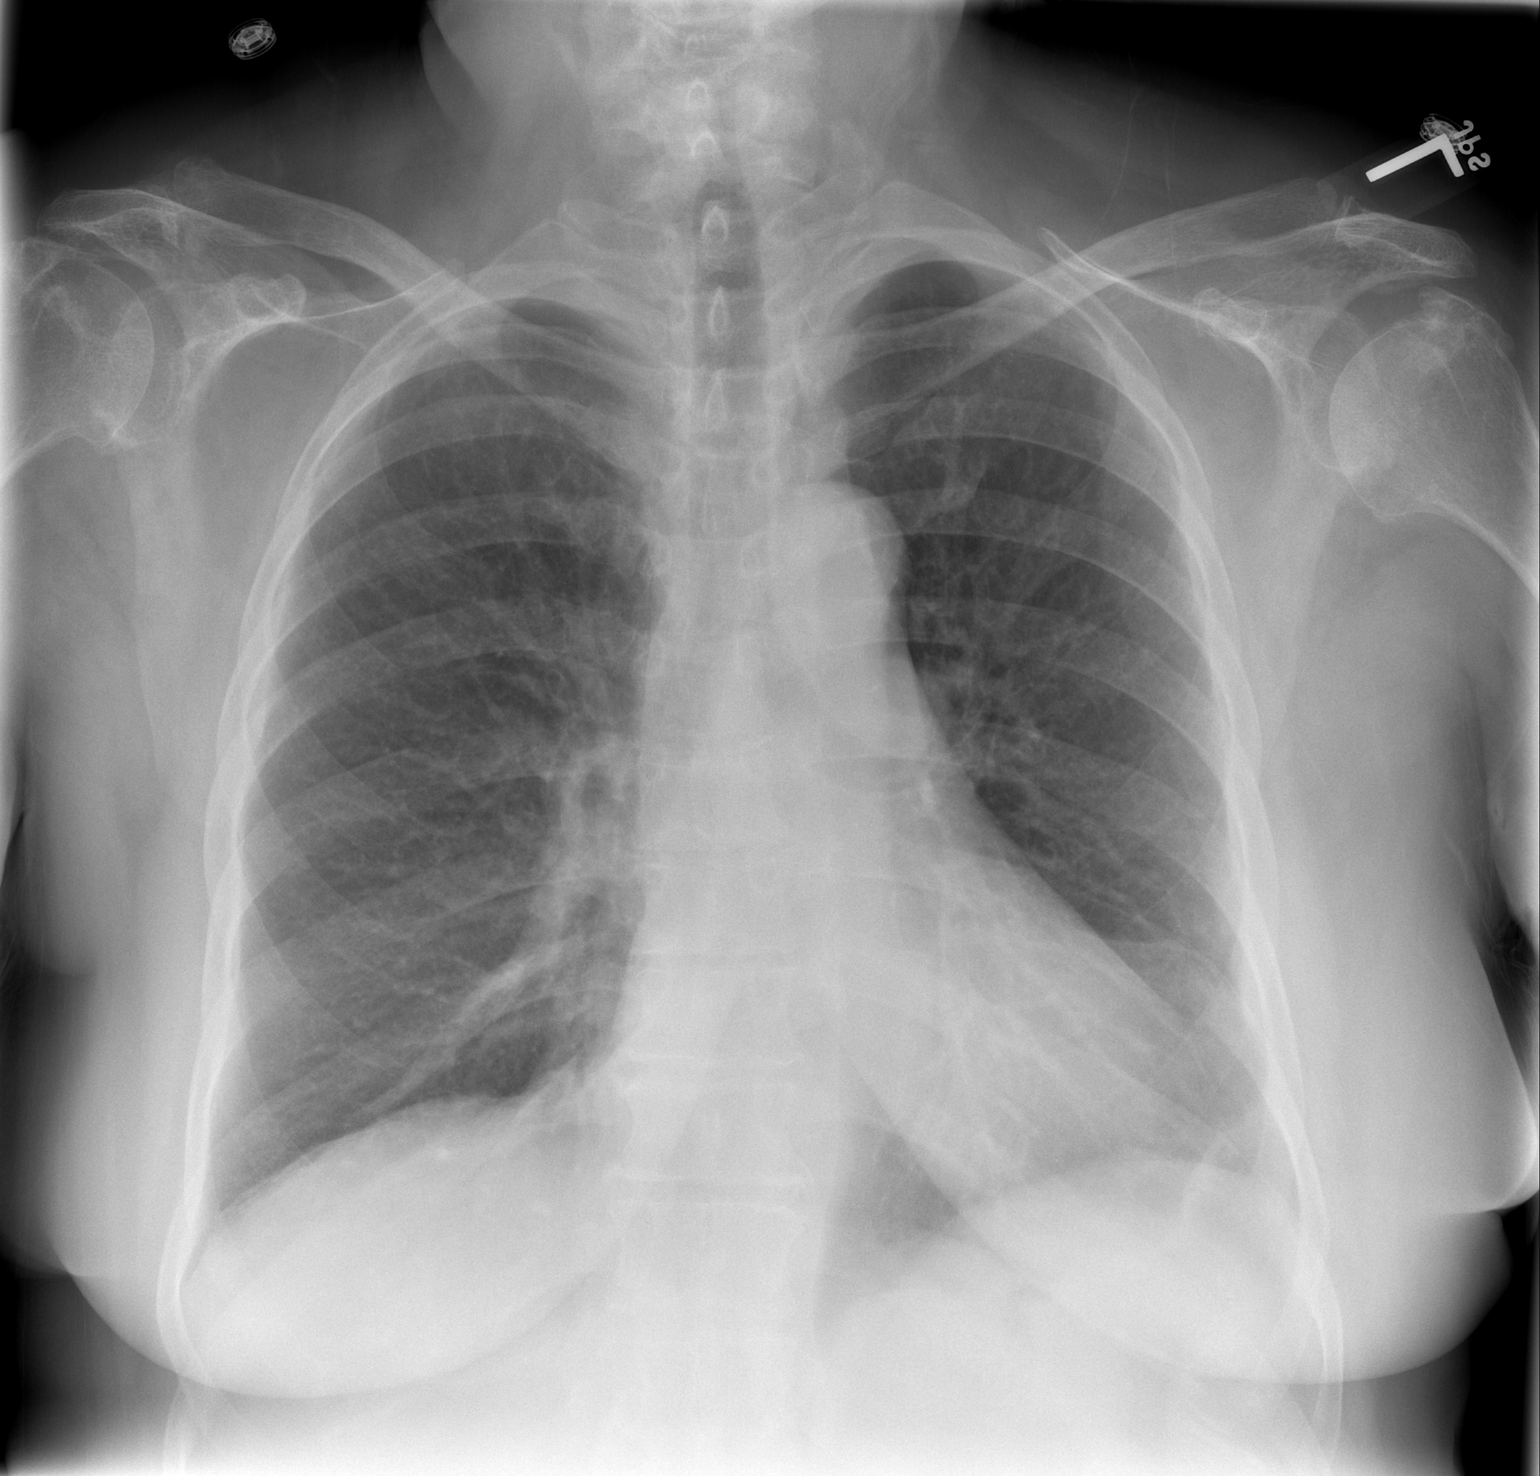

[w chest lat]
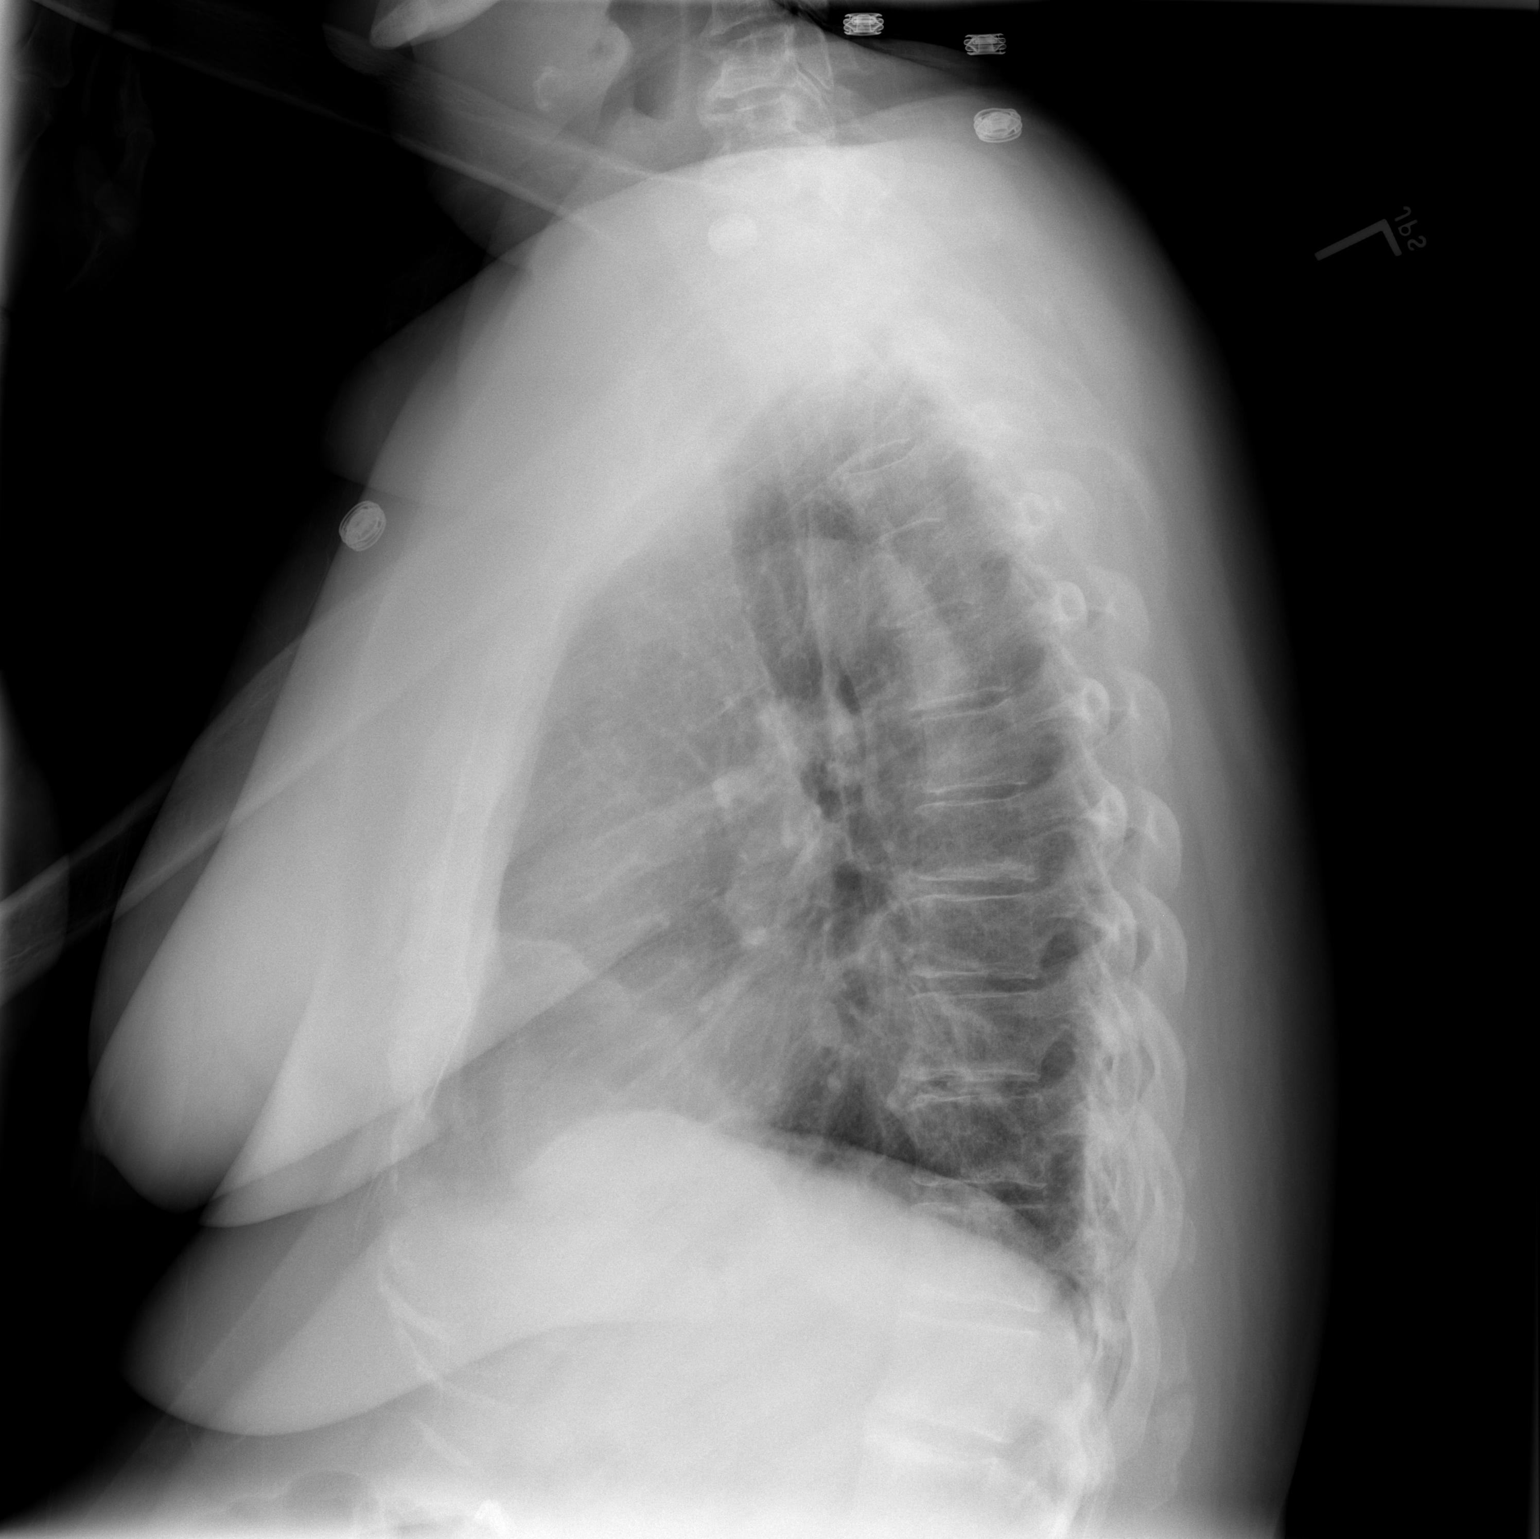

[2 of 2 positions shown; findings below may reference images not displayed]

FINDINGS: Trachea is midline.  Heart size within normal limits.
Mild pleural parenchymal scarring at the base of the left
hemithorax.  Lungs are otherwise clear.  No pleural fluid.
IMPRESSION: No acute findings.

## 2011-08-02 IMAGING — US US RENAL
1 series · 14 of 25 positions shown · non-contrast
Comparison: Renal ultrasound 08/12/2008.

CLINICAL DATA: Acute renal failure.  Urinary retention.

RENAL/URINARY TRACT ULTRASOUND COMPLETE

[Series 1: us renal · 0.25mm/px · 14 of 34 slices shown]
[im 1/34]
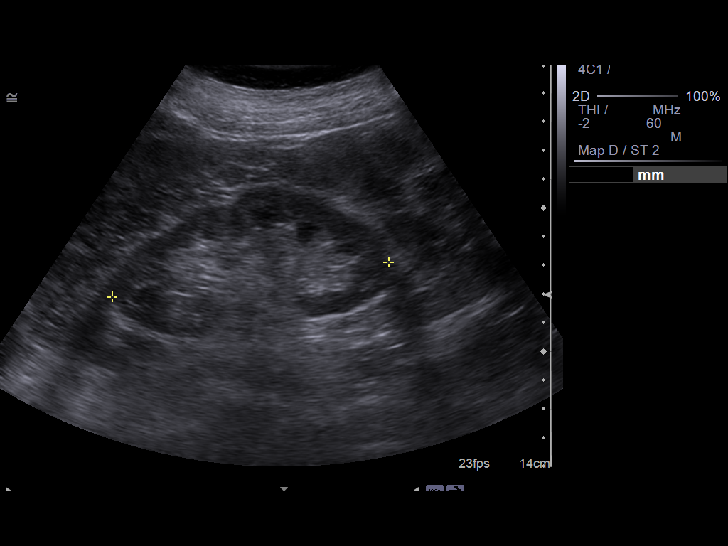
[im 3/34]
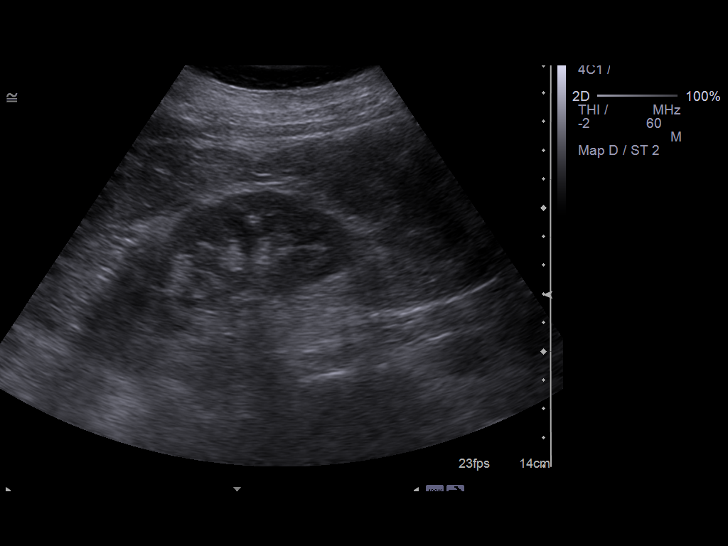
[im 6/34]
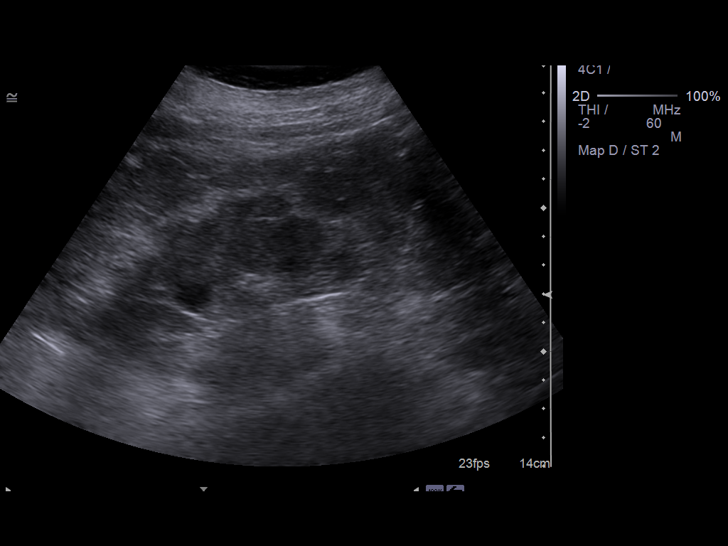
[im 9/34]
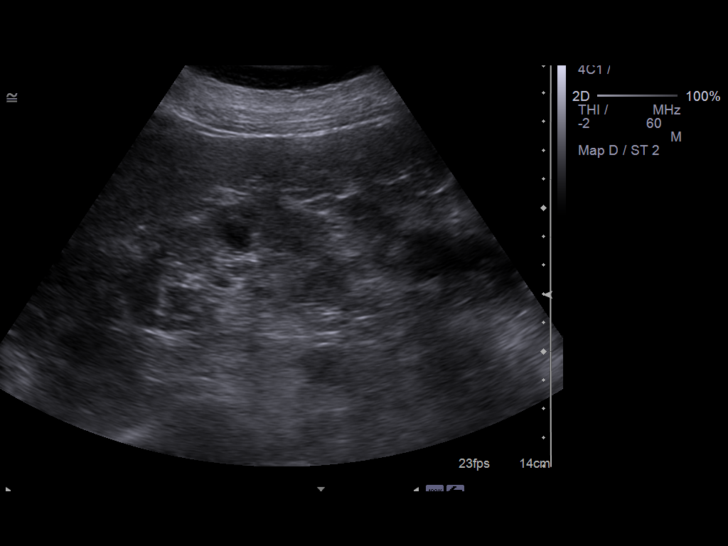
[im 12/34]
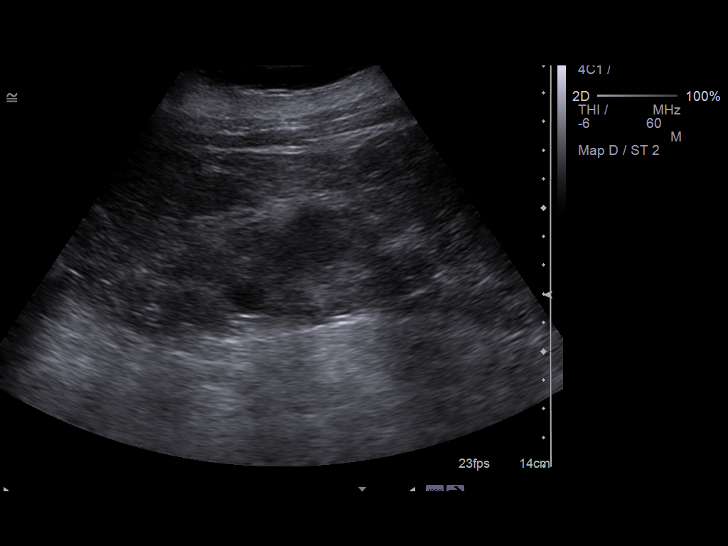
[im 13/34]
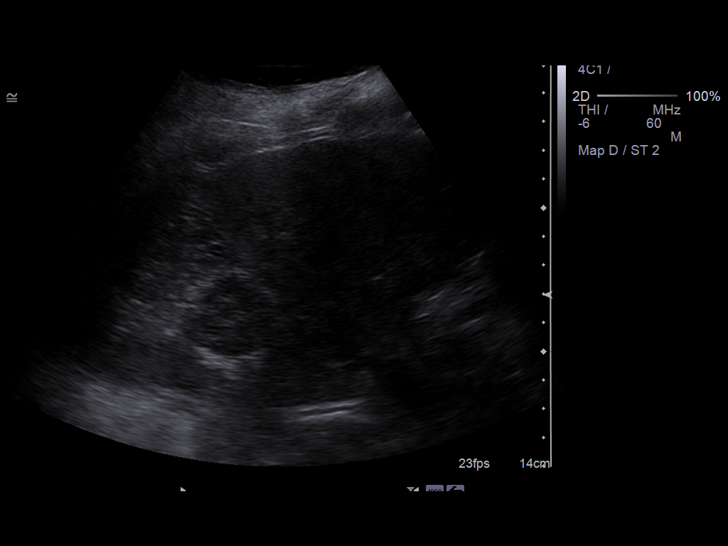
[im 16/34]
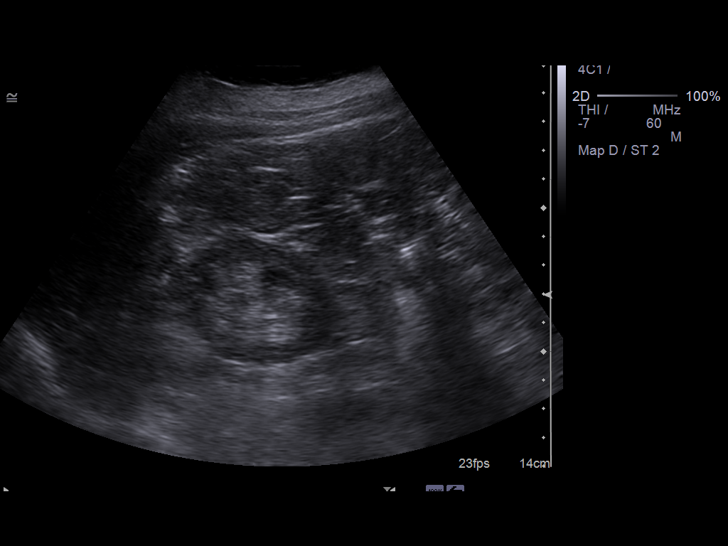
[im 18/34]
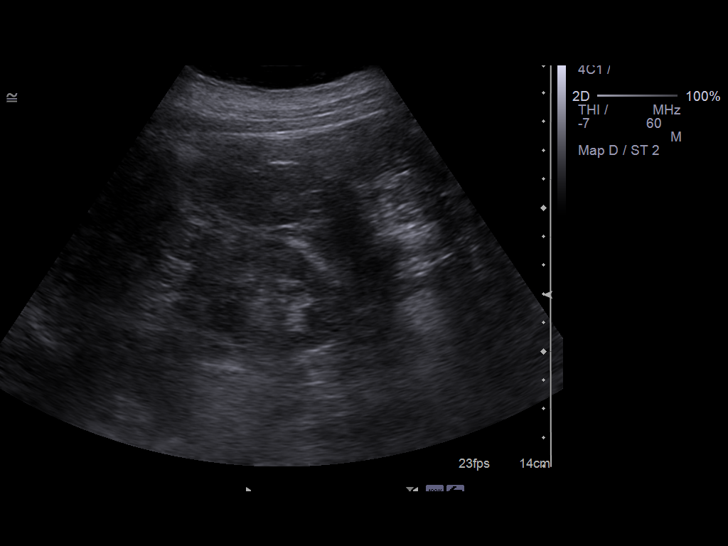
[im 21/34]
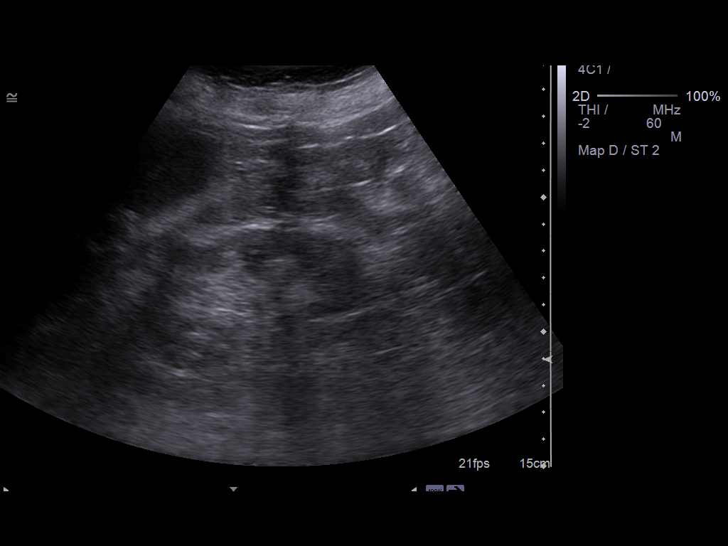
[im 23/34]
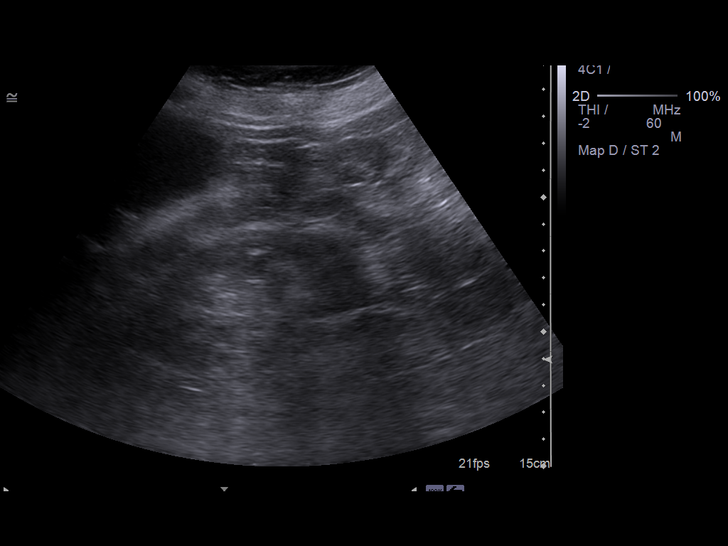
[im 25/34]
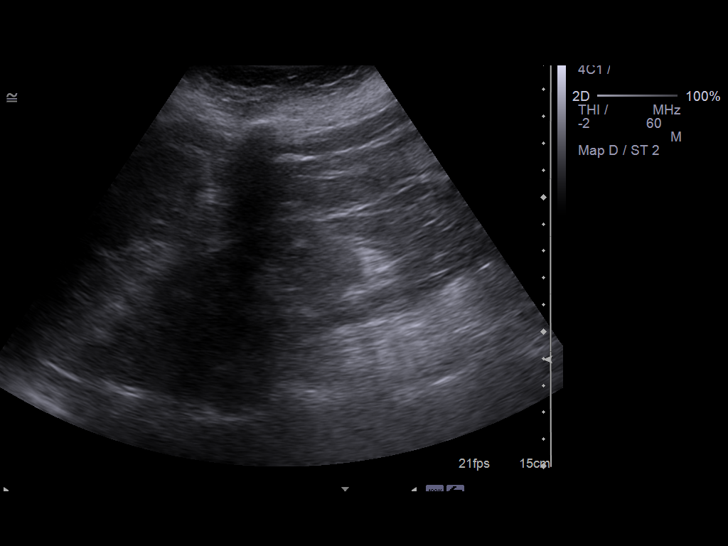
[im 28/34]
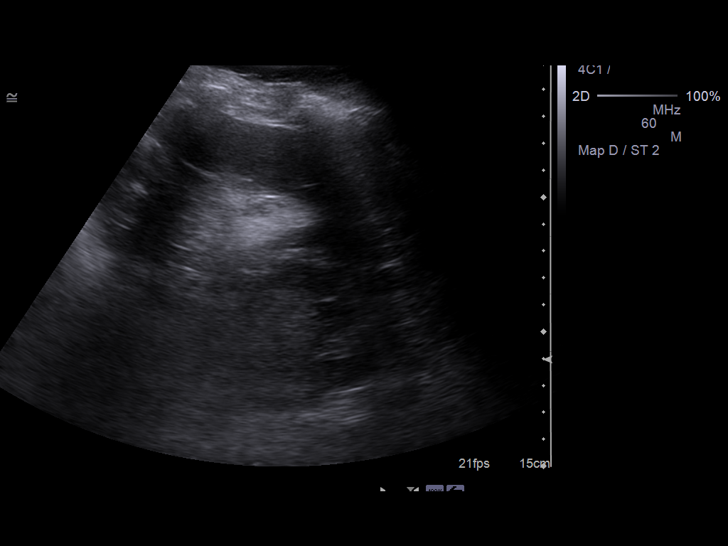
[im 31/34]
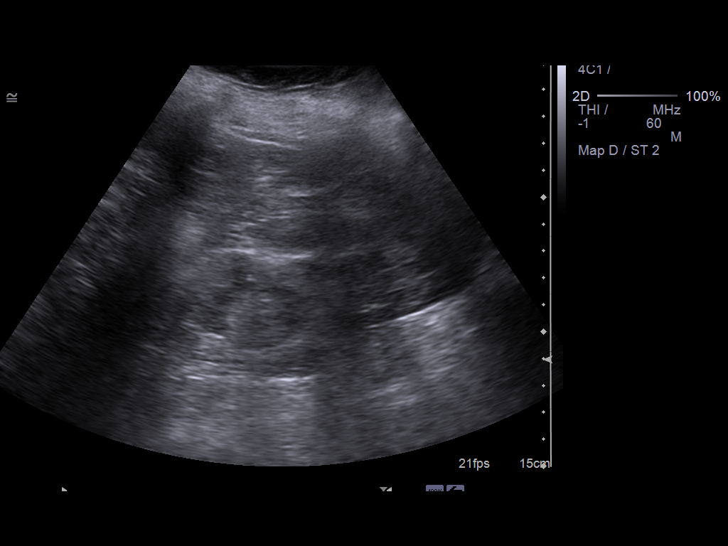
[im 34/34]
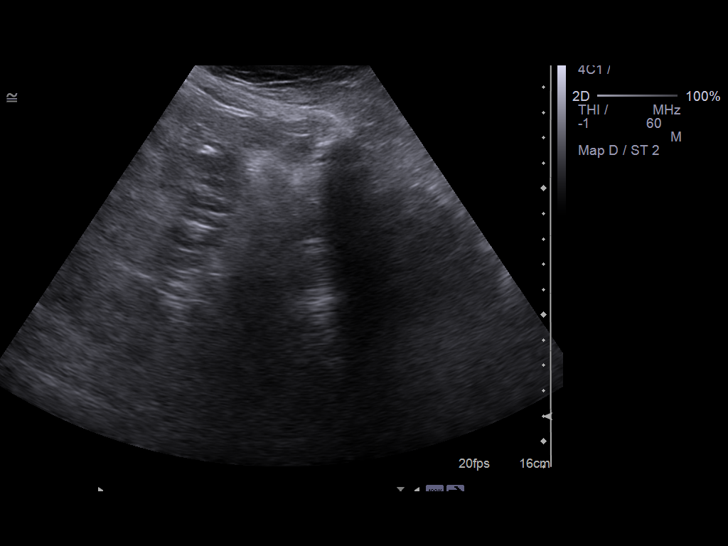

[14 of 25 positions shown; findings below may reference images not displayed]

FINDINGS: Right Kidney:  The right kidney measures 9.7 cm in length.  There
is a 13 mm cyst in the upper pole.  There is no hydronephrosis or
suspicious focal finding.

Left Kidney:  The left kidney measures 10.1 cm in length.  There is
no hydronephrosis or focal cortical abnormality.  Renal cortical
thickness and echogenicity appear preserved.

Bladder:  Decompressed by Foley catheter.
IMPRESSION: Stable examination with small right renal cyst.  No hydronephrosis.

## 2011-08-02 IMAGING — CT CT ABD-PELV W/O CM
3 of 4 series · 14 of 32 positions shown, 19 images · non-contrast
Comparison: Acute abdominal series 08/31/2009.  Abdominal pelvic CT
06/16/2002.  Renal ultrasound earlier today.

CLINICAL DATA: Back and abdominal pain.  Nausea vomiting.  Urinary
retention.

CT ABDOMEN AND PELVIS WITHOUT CONTRAST
TECHNIQUE: Multidetector CT imaging of the abdomen and pelvis was
performed following the standard protocol without intravenous
contrast.

[Series 2: routine abdomen · axial · 0.88mm/px · z∈[-387,-97]mm · 4 of 96 slices shown, 9 images]
[im 20/96  soft-tissue]
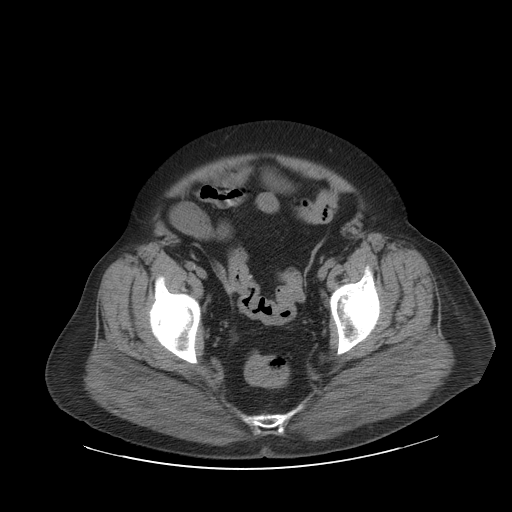
[im 20/96  lung]
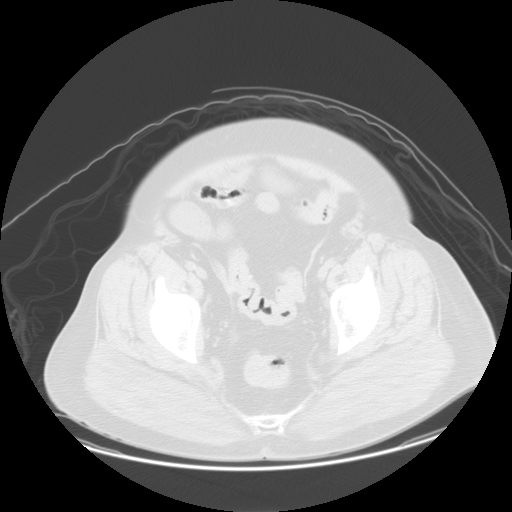
[im 20/96  bone]
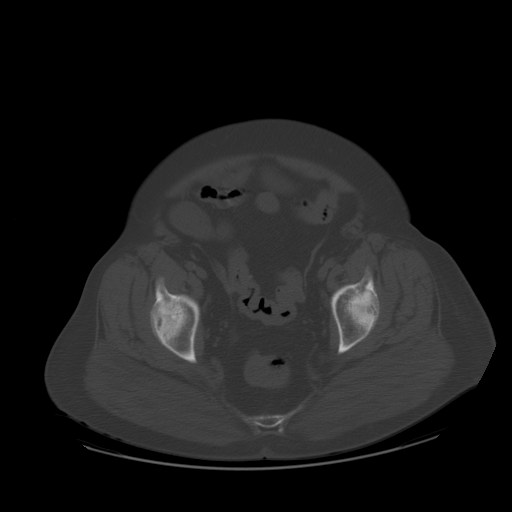
[im 39/96  soft-tissue]
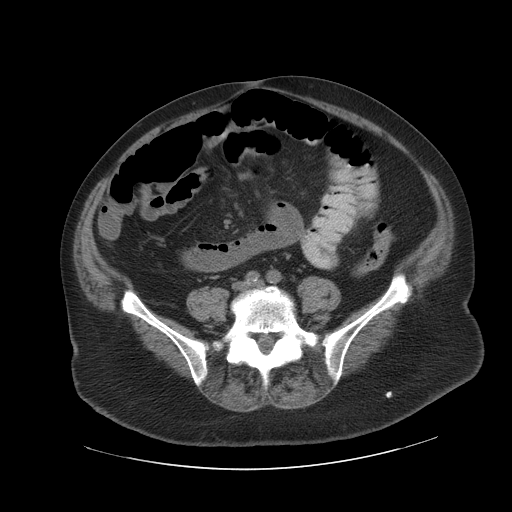
[im 39/96  lung]
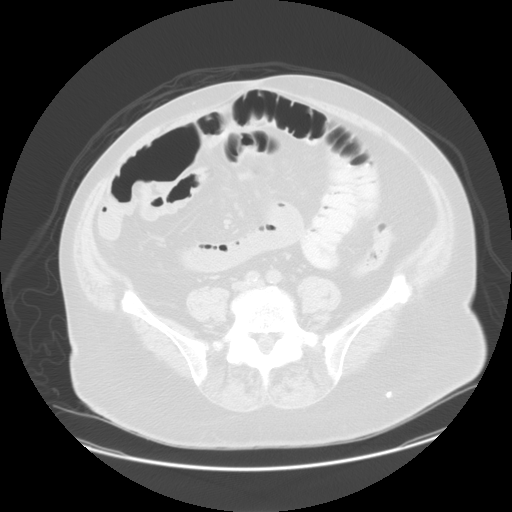
[im 58/96  soft-tissue]
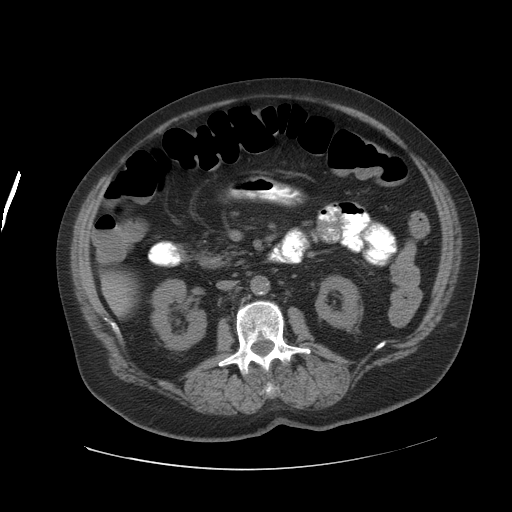
[im 58/96  lung]
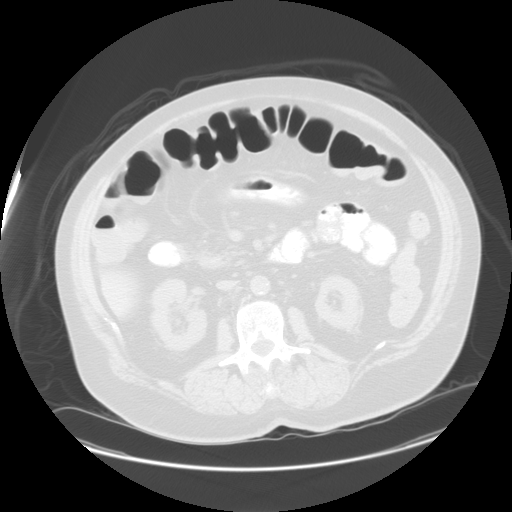
[im 77/96  soft-tissue]
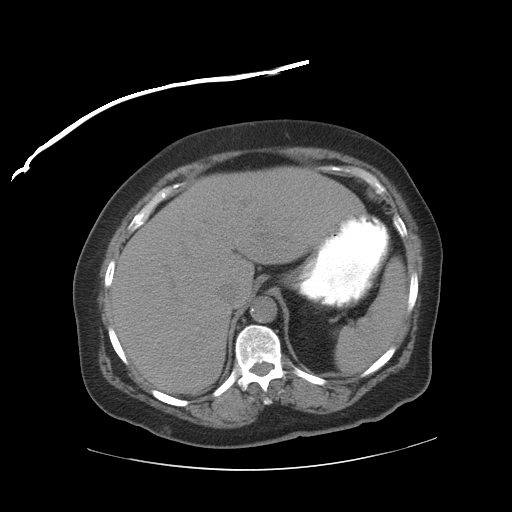
[im 77/96  lung]
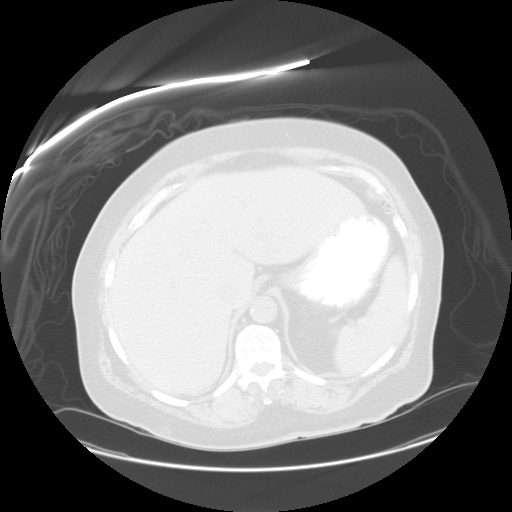

[Series 400: reformatted · sagittal · 0.96mm/px · 8 of 203 slices shown (1 of 2)]
[im 17/203  soft-tissue]
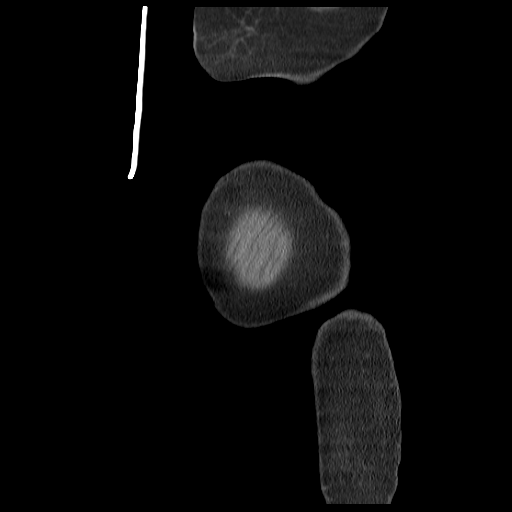
[im 51/203  soft-tissue]
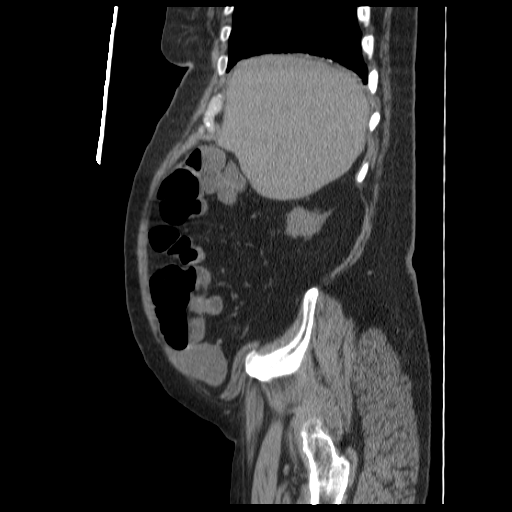
[im 68/203  soft-tissue]
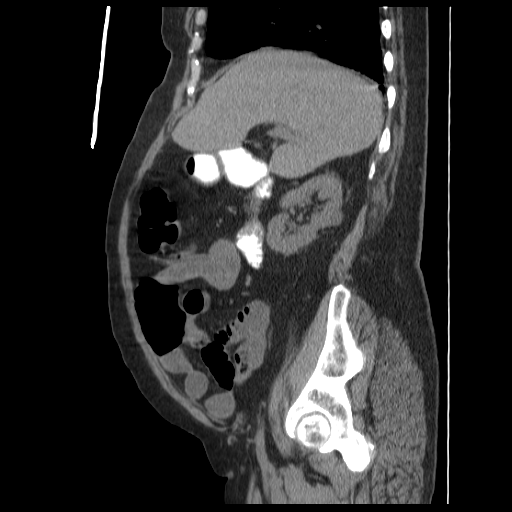
[im 85/203  soft-tissue]
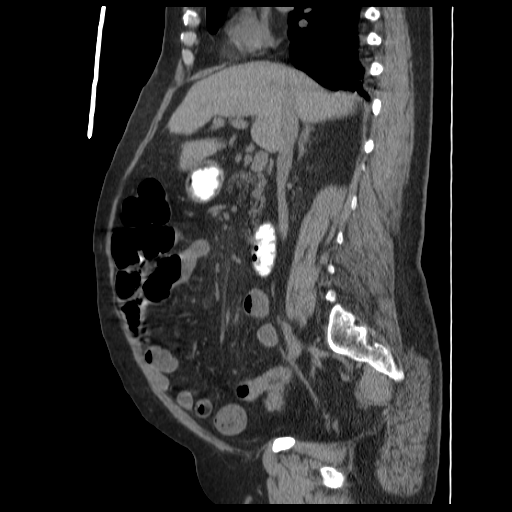
[im 118/203  soft-tissue]
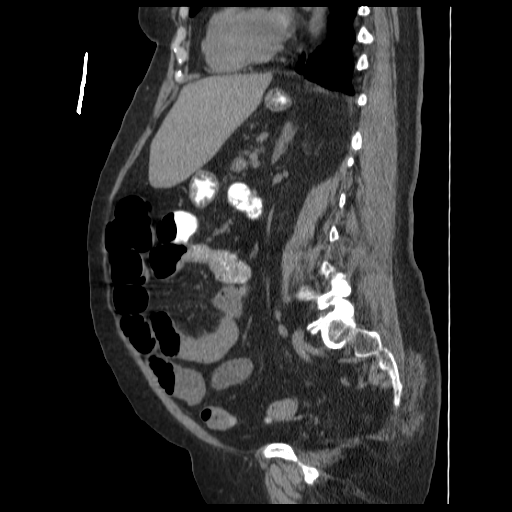
[im 135/203  soft-tissue]
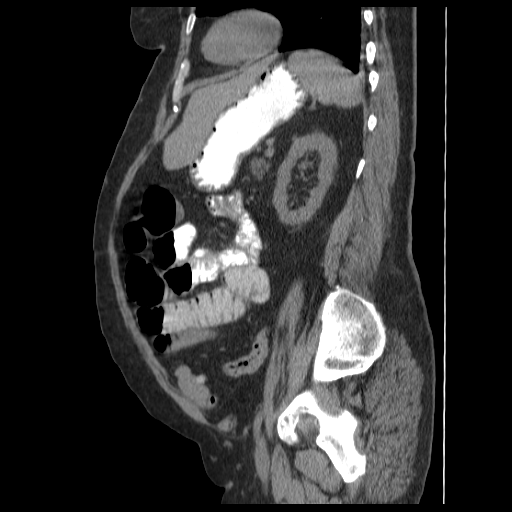
[im 152/203  soft-tissue]
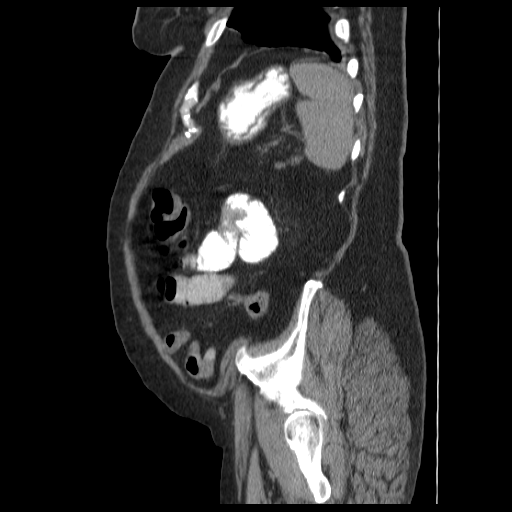
[im 186/203  soft-tissue]
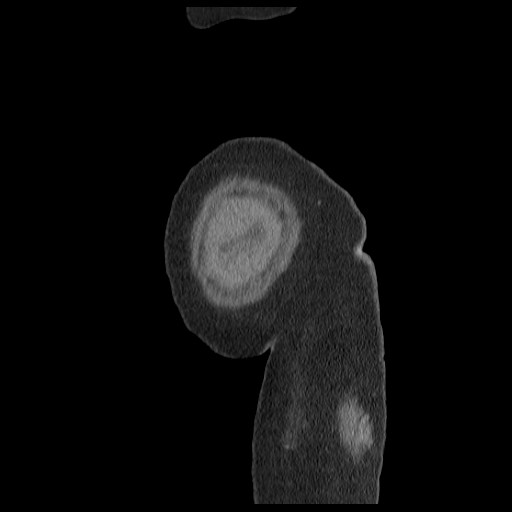

[Series 401: reformatted · coronal · 0.96mm/px · 2 of 167 slices shown (2 of 2)]
[im 17/167  soft-tissue]
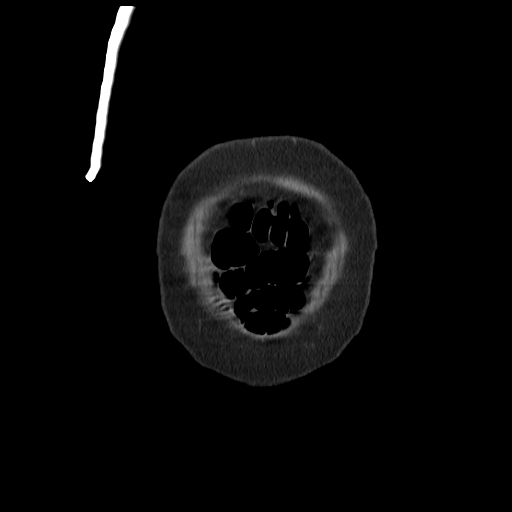
[im 34/167  soft-tissue]
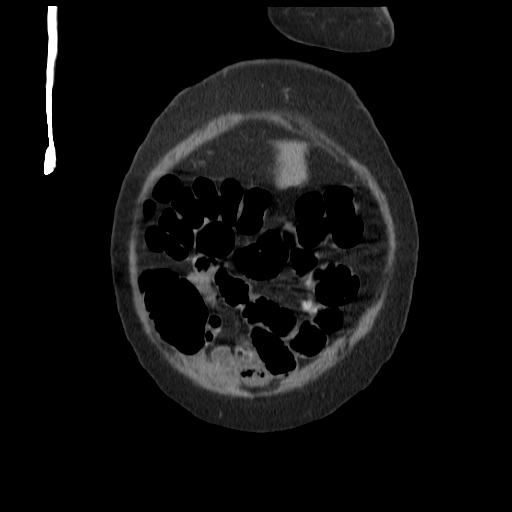

[14 of 32 positions shown; findings below may reference images not displayed]

FINDINGS: There is mild atelectasis at the lung bases.  The
gallbladder is surgically absent.  The liver, spleen, adrenal
glands and left kidney appear normal.  There is an enlarging low
density lesion projecting posteriorly from the mid right kidney.
This measures near water attenuation and is probably a cyst,
although is not optimally evaluated without contrast, and does not
clearly correspond with a cyst seen on the preceding ultrasound.
There is no hydronephrosis. The pancreas demonstrates fatty
replacement.

As demonstrated on earlier radiographs, there is mild small bowel
distension with scattered air fluid levels.  There is air and fluid
within the colon which is not significantly distended.  There is no
focal extraluminal fluid collection or focal transition point.
There are a few mildly prominent lymph nodes within the base of the
mesentery.

There are sigmoid colon diverticular changes.  The urinary bladder
is decompressed by a Foley catheter.  There has been previous
hysterectomy.
IMPRESSION: 1.  Nonspecific mild dilatation of the small bowel in excess of the
large bowel.  There is no focal transition point or high-grade
bowel obstruction.
2.  No evidence of intra-abdominal abscess.
3.  Mildly prominent lymph nodes within the base of the mesentery,
likely reactive.
4.  Enlarging low density right renal lesion most consistent with a
cyst.

## 2011-08-03 IMAGING — CR DG CHEST 2V
2 series · 2 of 2 positions shown · non-contrast
Comparison: 09/01/2009

CLINICAL DATA: Acute renal failure.

CHEST - 2 VIEW

[w chest pa]
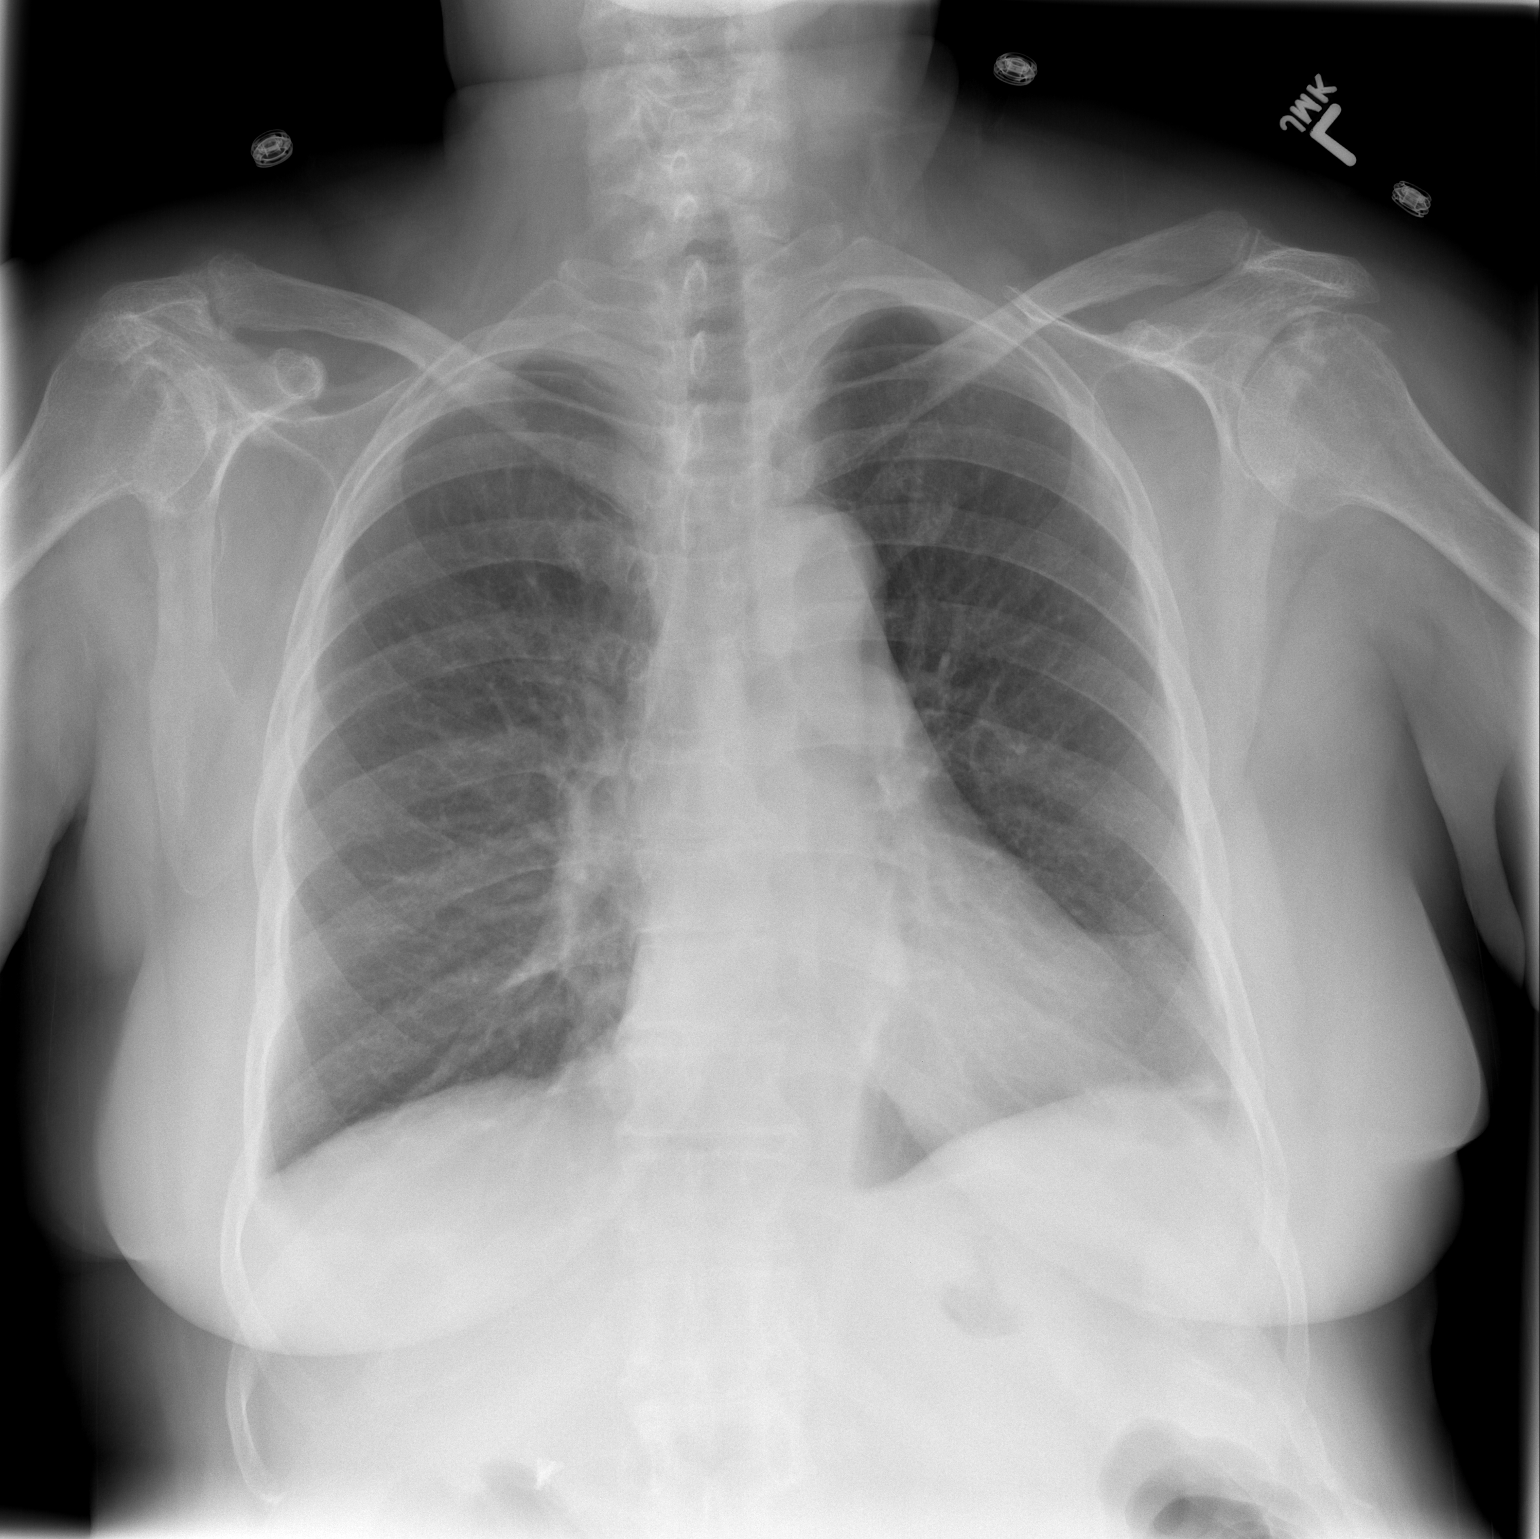

[w chest lat]
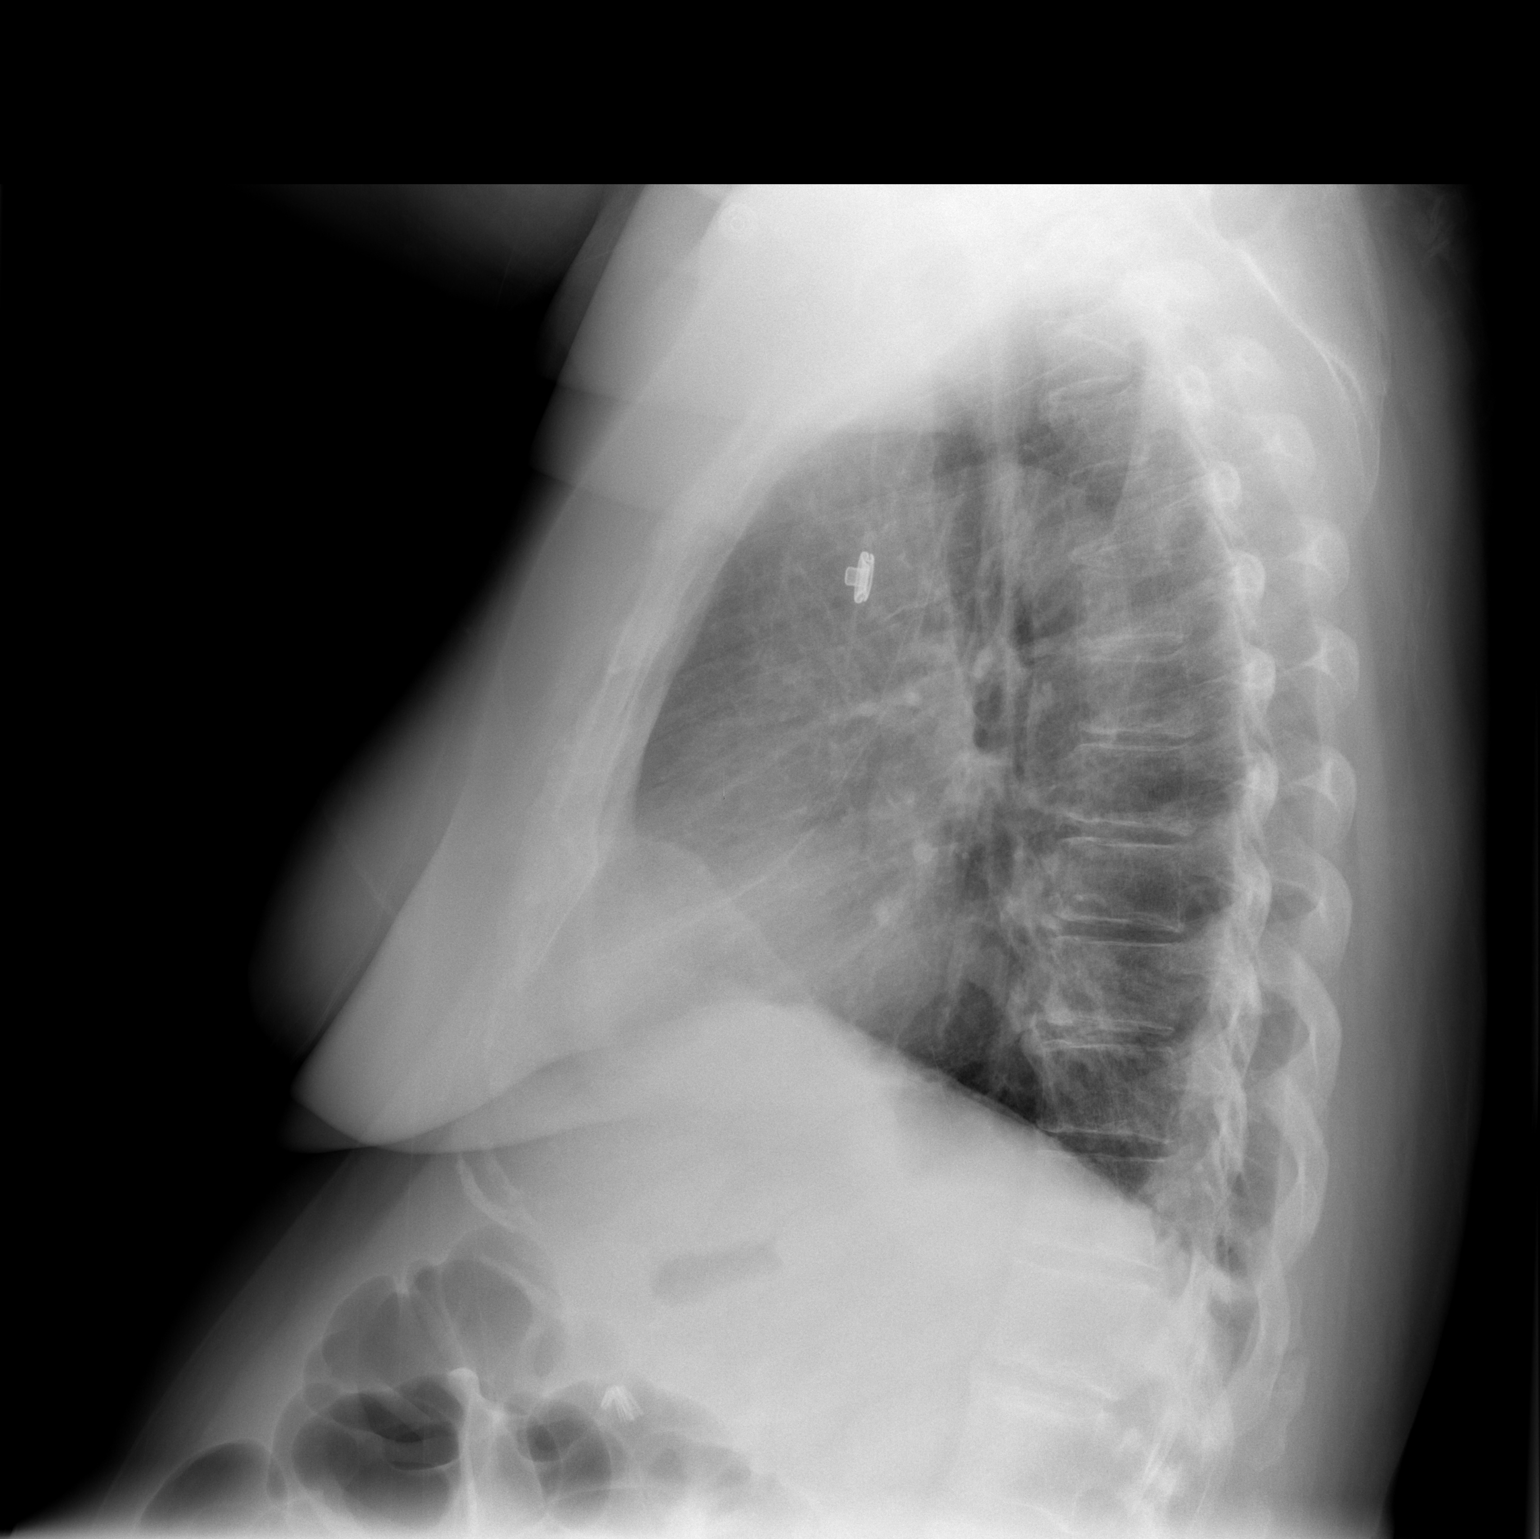

[2 of 2 positions shown; findings below may reference images not displayed]

FINDINGS: Heart size is within normal limits.  Mild scarring again
noted at left lung base.  No evidence of pulmonary infiltrate or
edema.  No evidence of pleural effusion.  No mass or adenopathy
identified.
IMPRESSION: Stable exam.  No active disease.

## 2011-08-03 IMAGING — CR DG ABDOMEN 2V
2 series · 2 of 2 positions shown · non-contrast
Comparison: 08/31/2009

CLINICAL DATA: Diarrhea.  Follow-up dilated bowel loops.  Acute
renal failure.

ABDOMEN - 2 VIEW

[w abdomen upright]
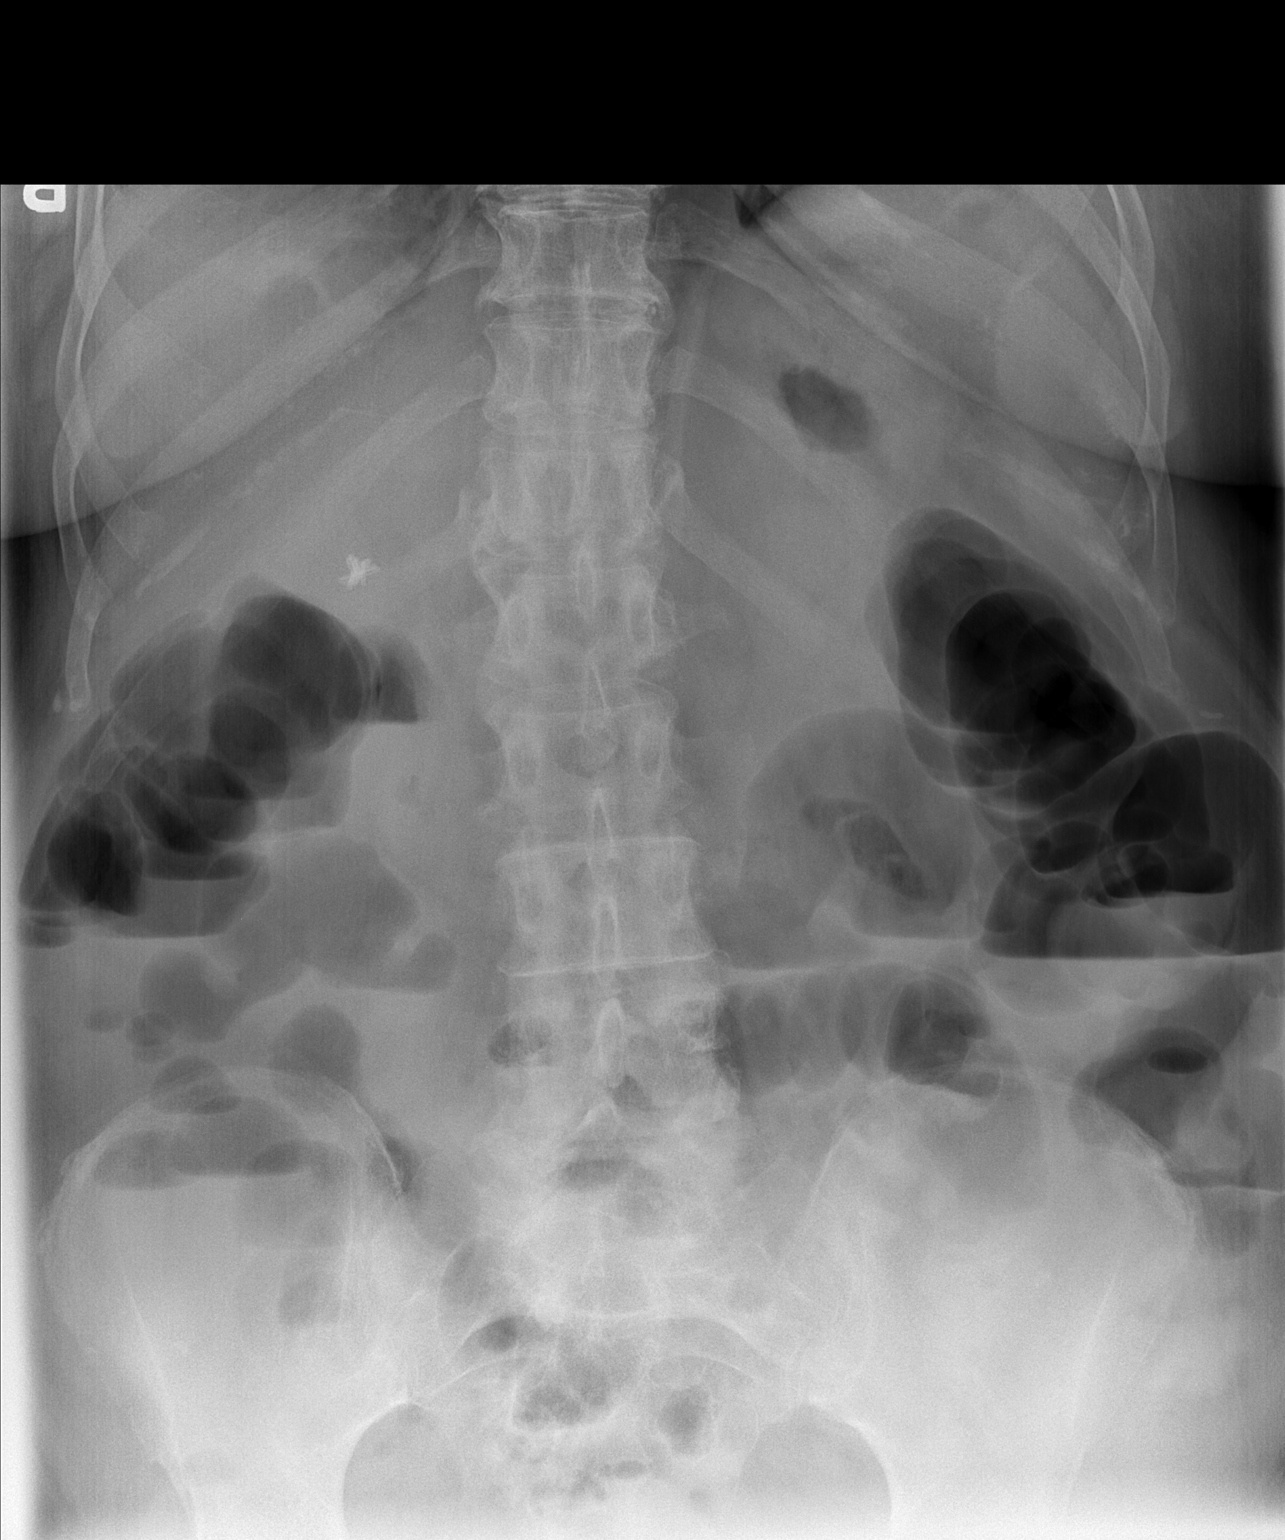

[t abdomen supine]
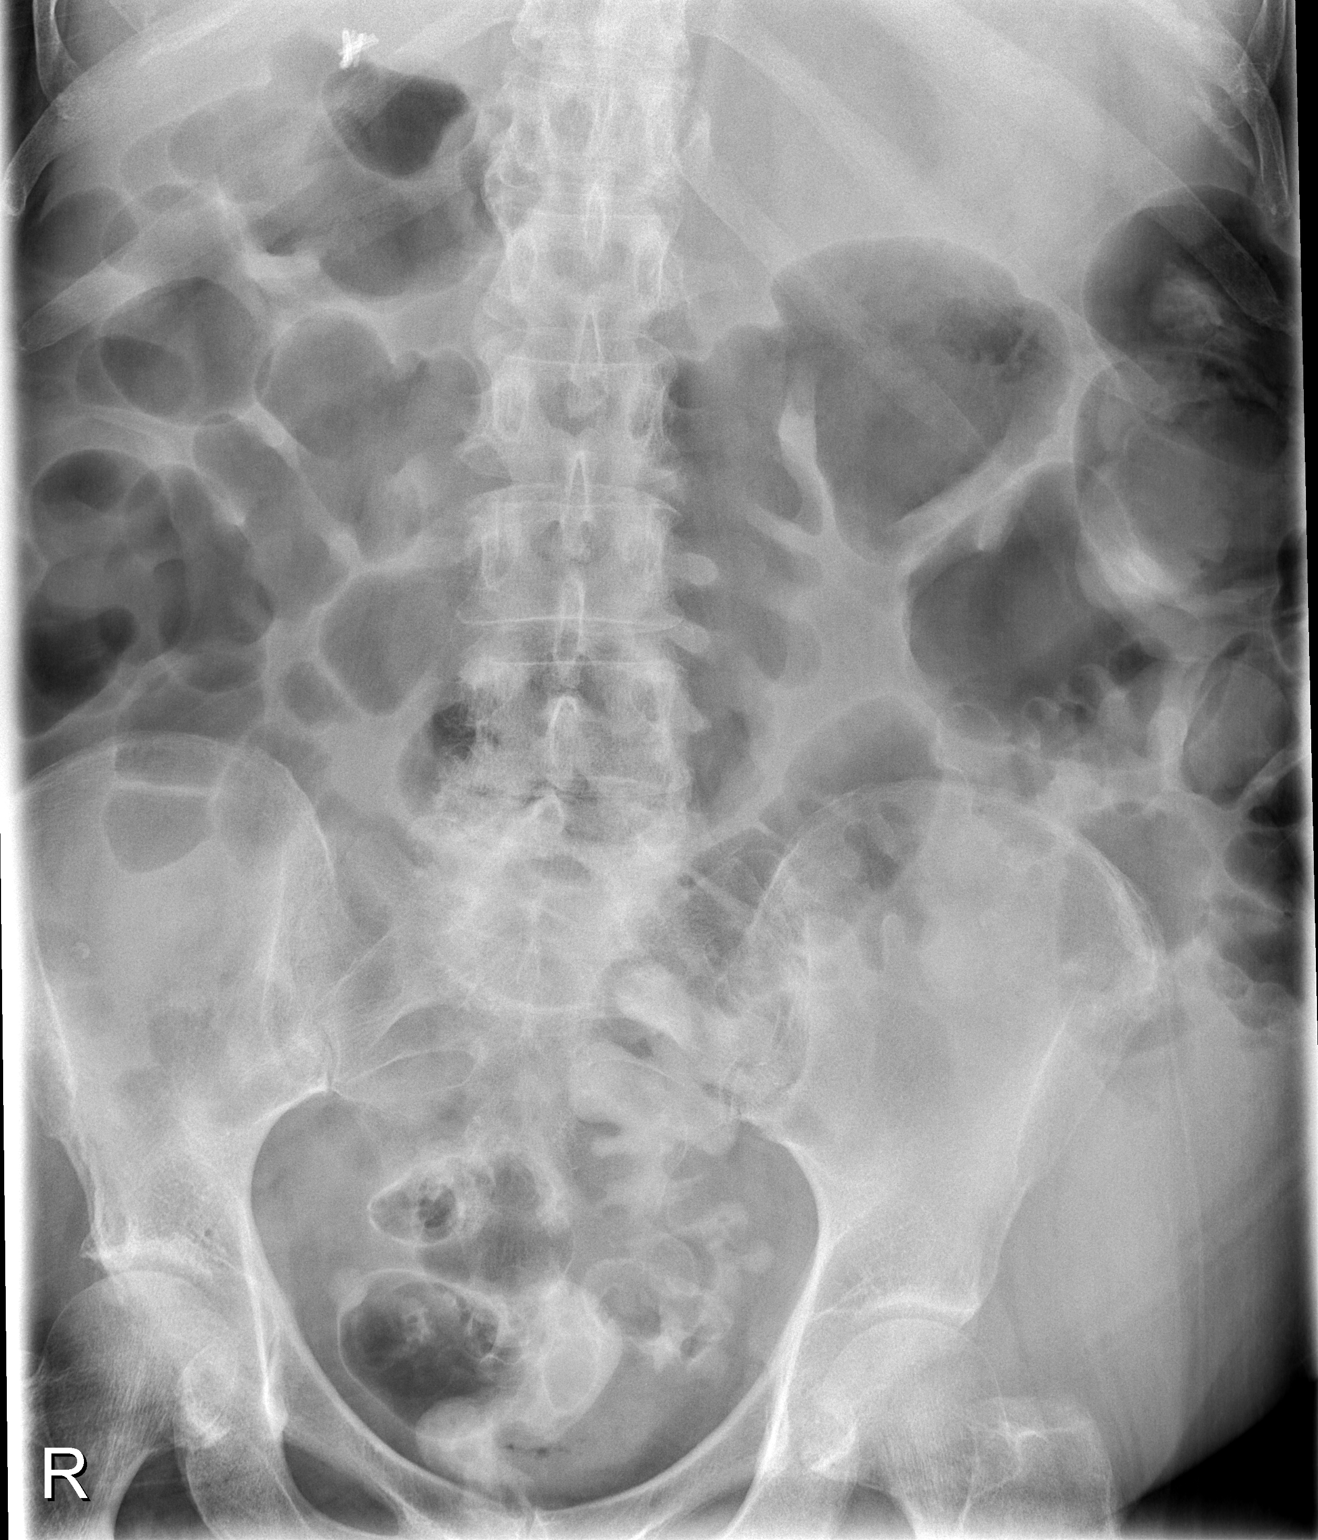

[2 of 2 positions shown; findings below may reference images not displayed]

FINDINGS: Decreased dilatation of small bowel is seen since
previous study.  Increased gas is seen within the colon which is
mildly distended.  Scattered air fluid levels are seen throughout
majority of the colon.  These findings are consistent with a mild
ileus.  A small amount of contrast from recent CT is now seen in
the rectum.  Mild sigmoid diverticulosis is noted.  No evidence of
free air.
IMPRESSION: Mild ileus pattern.  No evidence of bowel obstruction.
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
## 2011-08-04 IMAGING — CR DG CHEST 2V
1 series · 1 of 1 positions shown · non-contrast
Comparison: 09/02/2009

CLINICAL DATA: Cough

CHEST - 2 VIEW

[w chest lat]
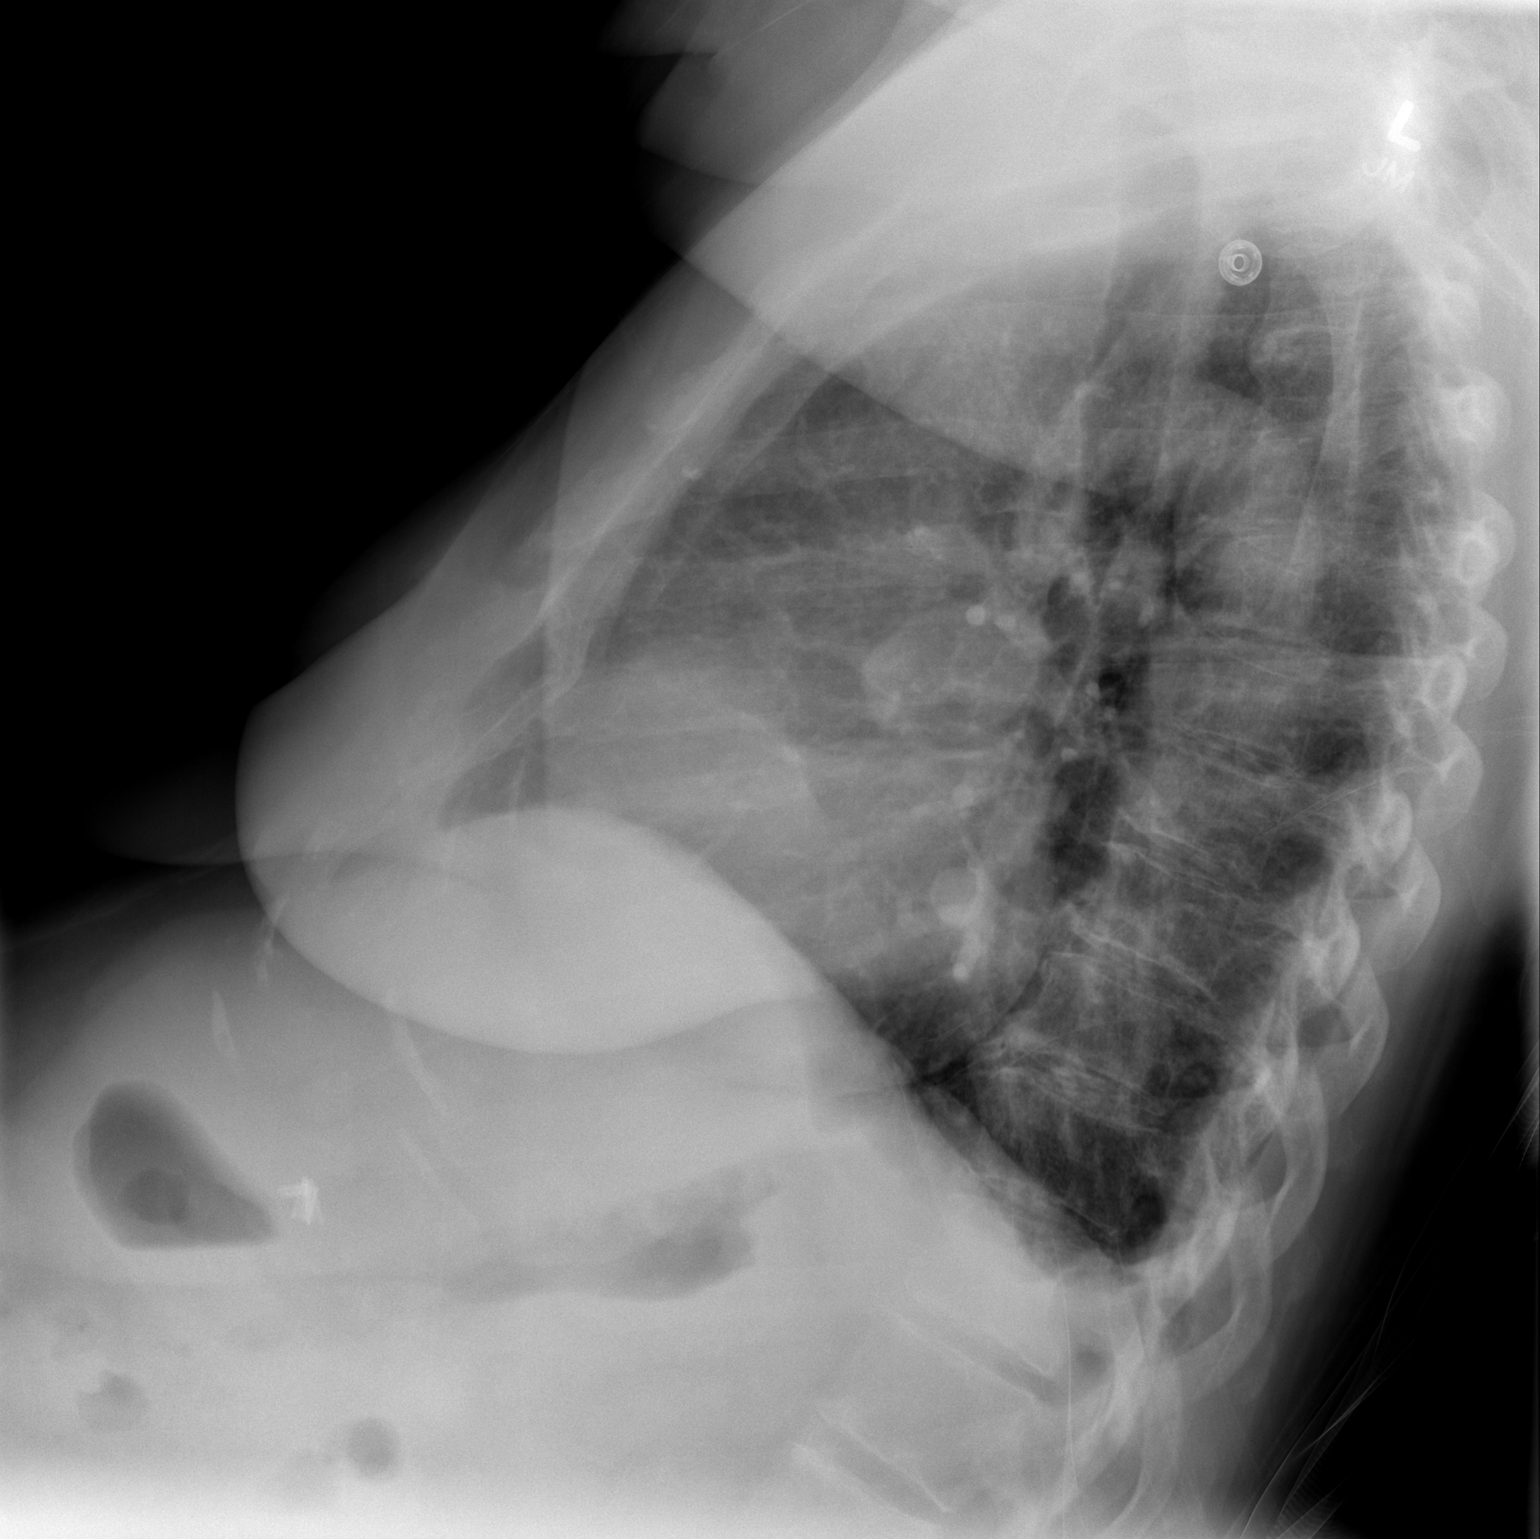

[1 of 1 positions shown; findings below may reference images not displayed]

FINDINGS: AP semi-erect and lateral views were obtained.  Heart
size upper normal considering AP projection.  No congestive heart
failure or active disease in one-view.  No pleural fluid or osseous
lesions.
IMPRESSION: No active disease.

## 2011-08-09 IMAGING — CR DG ABDOMEN 2V
2 series · 2 of 2 positions shown · non-contrast
Comparison: 09/02/2009

CLINICAL DATA: Abdominal discomfort and distention.

ABDOMEN - 2 VIEW

[w abdomen upright]
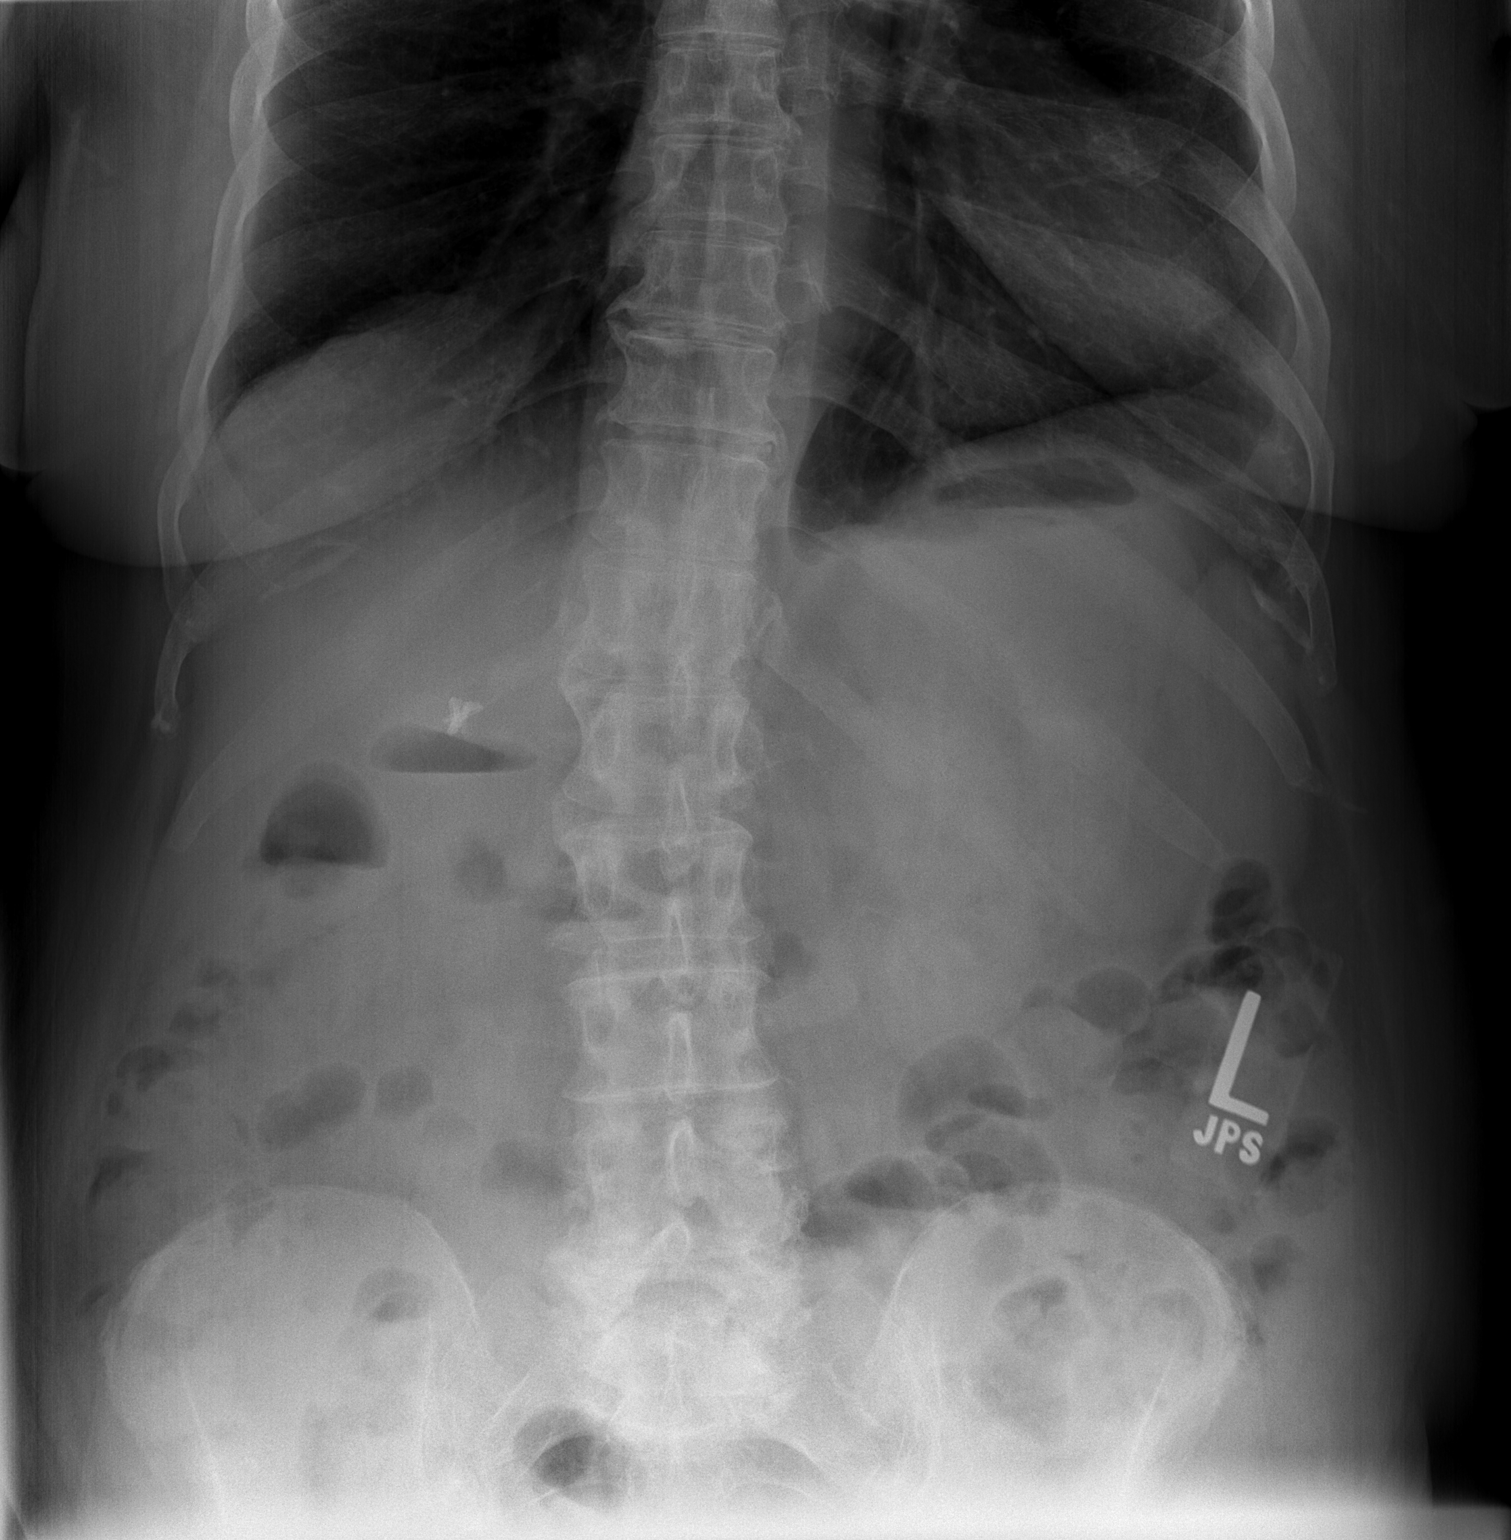

[t abdomen supine]
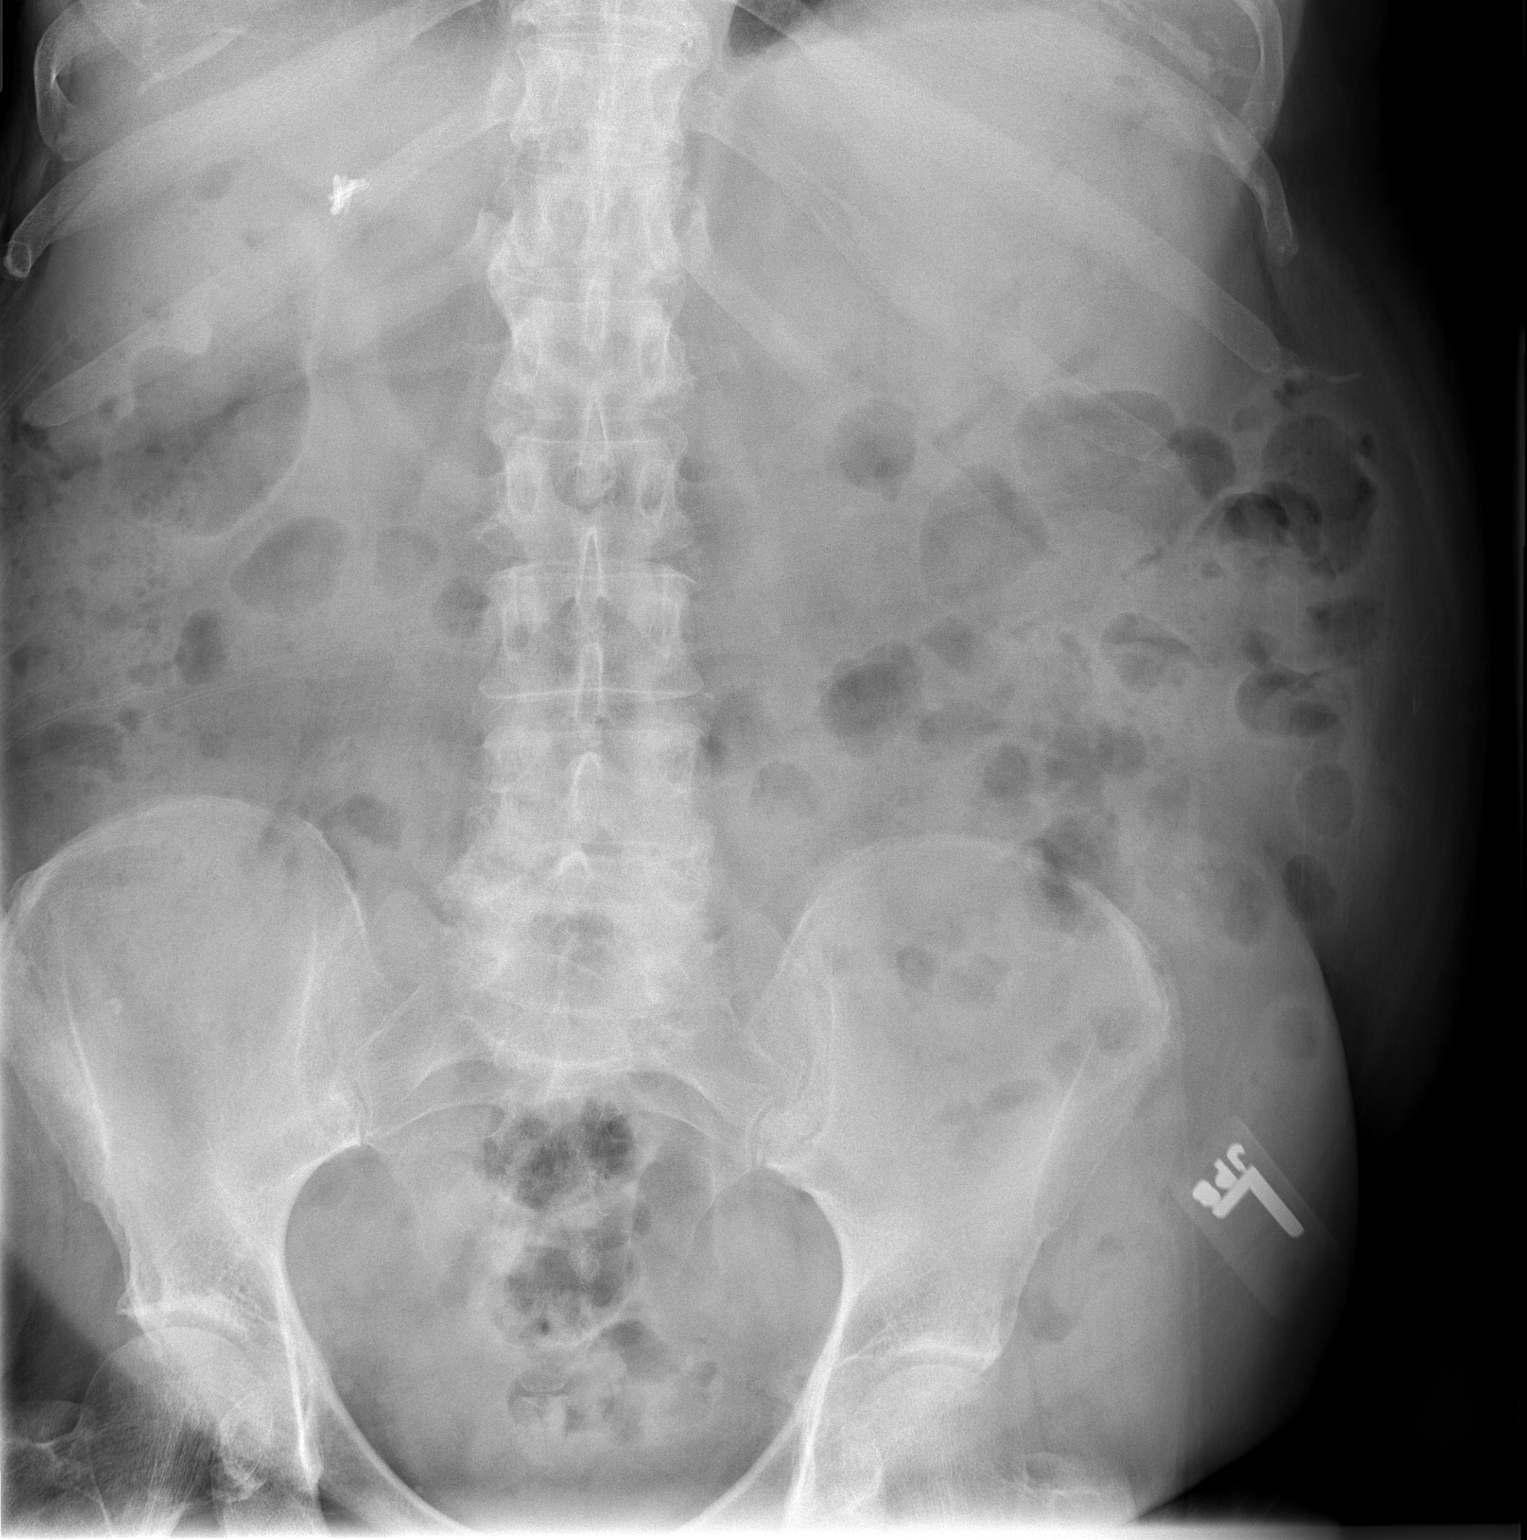

[2 of 2 positions shown; findings below may reference images not displayed]

FINDINGS: Stool and gas are scattered in nondilated colon.  Gas is
seen in nondilated small bowel.  There are scattered air fluid
levels.  No unexpected radiopaque calculi.
IMPRESSION: Improving ileus pattern.

## 2011-08-12 ENCOUNTER — Ambulatory Visit: Payer: Self-pay

## 2011-08-17 ENCOUNTER — Ambulatory Visit
Admission: RE | Admit: 2011-08-17 | Discharge: 2011-08-17 | Disposition: A | Discharge status: Home / Self Care | Payer: Medicare Other | Source: Ambulatory Visit | Attending: Family Medicine | Admitting: Family Medicine

## 2011-08-17 DIAGNOSIS — E119 Type 2 diabetes mellitus without complications: Secondary | ICD-10-CM | POA: Diagnosis not present

## 2011-08-17 DIAGNOSIS — J45902 Unspecified asthma with status asthmaticus: Secondary | ICD-10-CM | POA: Diagnosis not present

## 2011-08-17 DIAGNOSIS — Z1231 Encounter for screening mammogram for malignant neoplasm of breast: Secondary | ICD-10-CM | POA: Diagnosis not present

## 2011-08-23 ENCOUNTER — Other Ambulatory Visit: Payer: Self-pay | Admitting: Family Medicine

## 2011-08-23 MED ORDER — LEVOTHYROXINE SODIUM 50 MCG PO TABS: 50.0000 ug | ORAL_TABLET | Freq: Every day | ORAL | Status: DC

## 2011-08-27 ENCOUNTER — Ambulatory Visit (INDEPENDENT_AMBULATORY_CARE_PROVIDER_SITE_OTHER): Payer: Medicare Other | Admitting: Family Medicine

## 2011-08-27 ENCOUNTER — Encounter: Payer: Self-pay | Admitting: Family Medicine

## 2011-08-27 VITALS — BP 92/61 | HR 94 | Temp 97.9°F | Ht 64.0 in | Wt 176.0 lb

## 2011-08-27 DIAGNOSIS — K5289 Other specified noninfective gastroenteritis and colitis: Secondary | ICD-10-CM

## 2011-08-27 DIAGNOSIS — N390 Urinary tract infection, site not specified: Secondary | ICD-10-CM

## 2011-08-27 DIAGNOSIS — K529 Noninfective gastroenteritis and colitis, unspecified: Secondary | ICD-10-CM | POA: Insufficient documentation

## 2011-08-27 LAB — POCT URINALYSIS DIPSTICK
Blood, UA: NEGATIVE
Ketones, UA: NEGATIVE
Protein, UA: 100
Spec Grav, UA: >=1.03
Urobilinogen, UA: 0.2
pH, UA: 5.5

## 2011-08-27 MED ORDER — ONDANSETRON HCL 4 MG PO TABS: 4.0000 mg | ORAL_TABLET | Freq: Four times a day (QID) | ORAL | Status: AC | PRN

## 2011-08-27 NOTE — Patient Instructions (Signed)
Dear Rebecca [name],   It was great to see you today. Thank you for coming to clinic. Please read below regarding the issues that we discussed.   1. It appears you have a stomach virus likely norovirus. This illness can last up to 2-3 days. You will still be infectious up to 48 hours after your last symptoms. The most important thing is for you to stay well hydrated. I would like for you to get Gatorade G2 (low sugar). At least for next 24 hours, please use only a liquid diet then advance slowly.   2. For your nausea, we sent in a prescription to help you which you can take up to every 6 hours as needed.  3. You should return to medical care if you cannot keep down fluids, have bloody diarrhea or vomiting, or start having fevers.  4. I would like for you to follow up with Dr. Earnest Bailey within the next week.  5. If there is anything concerning on your labs before then, I will call you.   Please call earlier if you have any questions or concerns.   Sincerely,  Dr. Tana Conch

## 2011-08-28 LAB — BASIC METABOLIC PANEL
BUN: 47 mg/dL — ABNORMAL HIGH (ref 6–23)
CO2: 19 mEq/L (ref 19–32)
Chloride: 103 mEq/L (ref 96–112)
Creat: 2.12 mg/dL — ABNORMAL HIGH (ref 0.50–1.10)
Glucose, Bld: 162 mg/dL — ABNORMAL HIGH (ref 70–99)
Potassium: 5.7 mEq/L — ABNORMAL HIGH (ref 3.5–5.3)
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
## 2011-08-30 ENCOUNTER — Ambulatory Visit (INDEPENDENT_AMBULATORY_CARE_PROVIDER_SITE_OTHER): Payer: Medicare Other | Admitting: Family Medicine

## 2011-08-30 ENCOUNTER — Encounter: Payer: Self-pay | Admitting: Family Medicine

## 2011-08-30 VITALS — BP 135/68 | HR 82 | Temp 98.0°F | Ht 64.0 in | Wt 172.0 lb

## 2011-08-30 DIAGNOSIS — R300 Dysuria: Secondary | ICD-10-CM | POA: Diagnosis not present

## 2011-08-30 DIAGNOSIS — K5289 Other specified noninfective gastroenteritis and colitis: Secondary | ICD-10-CM | POA: Diagnosis not present

## 2011-08-30 DIAGNOSIS — K529 Noninfective gastroenteritis and colitis, unspecified: Secondary | ICD-10-CM

## 2011-08-30 LAB — POCT URINALYSIS DIPSTICK
Bilirubin, UA: NEGATIVE
Glucose, UA: NEGATIVE
Nitrite, UA: NEGATIVE
Urobilinogen, UA: 0.2

## 2011-08-30 LAB — POCT UA - MICROSCOPIC ONLY

## 2011-08-30 MED ORDER — CEPHALEXIN 500 MG PO CAPS: 500.0000 mg | ORAL_CAPSULE | Freq: Two times a day (BID) | ORAL | Status: AC

## 2011-08-30 NOTE — Progress Notes (Signed)
  Subjective:    Patient ID: Rebecca [name], female    DOB: [birth year], 71 y.o.   MRN: [id number]  HPI Seen 3 days ago for gastroenteritis.  Cr and K at that time was mildly elevated.  Patient denies any emesis since that day.  Continues to have some loose stools.  Abd mildly tender, improving.  Tolerating Soft PO diet (pureed solids due to dentures being in repair).  No fever, chills.  Notes this morning had some burning with urination, urinating more frequently than usual (hourly).   Review of Systemssee hpi     Objective:   Physical Exam  GEN: Alert & Oriented, No acute distress CV:  Regular Rate & Rhythm, no murmur Respiratory:  Normal work of breathing, CTAB Abd:  + BS, soft, mild tenderness to palpation throughout, mild distension        Assessment & Plan:

## 2011-08-30 NOTE — Assessment & Plan Note (Signed)
Dysuria with urinary freuqency.

## 2011-08-30 NOTE — Patient Instructions (Signed)
I am encouraged by her improving stomach bug  Will check urine for infection and kidney function today  Let me know if you have worsening pain, fever, or other concerning symptoms.  Follow-up in April for checkup

## 2011-08-30 NOTE — Assessment & Plan Note (Addendum)
Resolving.  Good PO intake.    Will check Cr and K today.  No evidence of bowel infection or obstruction.  Encouraged continued fluid intake and will seek care for worsening symptoms

## 2011-08-31 LAB — BASIC METABOLIC PANEL
BUN: 33 mg/dL — ABNORMAL HIGH (ref 6–23)
CO2: 24 mEq/L (ref 19–32)
Calcium: 9.8 mg/dL (ref 8.4–10.5)
Creat: 1.53 mg/dL — ABNORMAL HIGH (ref 0.50–1.10)
Glucose, Bld: 109 mg/dL — ABNORMAL HIGH (ref 70–99)
Sodium: 134 mEq/L — ABNORMAL LOW (ref 135–145)

## 2011-09-01 LAB — URINE CULTURE

## 2011-09-02 ENCOUNTER — Ambulatory Visit: Payer: Self-pay | Admitting: Family Medicine

## 2011-09-16 DIAGNOSIS — J45902 Unspecified asthma with status asthmaticus: Secondary | ICD-10-CM | POA: Diagnosis not present

## 2011-09-16 DIAGNOSIS — E119 Type 2 diabetes mellitus without complications: Secondary | ICD-10-CM | POA: Diagnosis not present

## 2011-09-20 ENCOUNTER — Encounter: Payer: Self-pay | Admitting: Family Medicine

## 2011-09-20 ENCOUNTER — Ambulatory Visit (INDEPENDENT_AMBULATORY_CARE_PROVIDER_SITE_OTHER): Payer: Medicare Other | Admitting: Family Medicine

## 2011-09-20 VITALS — BP 142/70 | HR 68 | Temp 98.3°F | Ht 64.0 in | Wt 174.0 lb

## 2011-09-20 DIAGNOSIS — M129 Arthropathy, unspecified: Secondary | ICD-10-CM | POA: Diagnosis not present

## 2011-09-20 DIAGNOSIS — N390 Urinary tract infection, site not specified: Secondary | ICD-10-CM | POA: Diagnosis not present

## 2011-09-20 DIAGNOSIS — I2510 Atherosclerotic heart disease of native coronary artery without angina pectoris: Secondary | ICD-10-CM | POA: Diagnosis not present

## 2011-09-20 DIAGNOSIS — E7800 Pure hypercholesterolemia, unspecified: Secondary | ICD-10-CM

## 2011-09-20 DIAGNOSIS — Z23 Encounter for immunization: Secondary | ICD-10-CM

## 2011-09-20 DIAGNOSIS — R300 Dysuria: Secondary | ICD-10-CM

## 2011-09-20 LAB — POCT UA - MICROSCOPIC ONLY

## 2011-09-20 LAB — POCT URINALYSIS DIPSTICK
Bilirubin, UA: NEGATIVE
Blood, UA: NEGATIVE
Glucose, UA: NEGATIVE
Ketones, UA: NEGATIVE
Nitrite, UA: NEGATIVE

## 2011-09-20 MED ORDER — HYDROXYZINE HCL 25 MG PO TABS: 25.0000 mg | ORAL_TABLET | Freq: Two times a day (BID) | ORAL | Status: DC

## 2011-09-20 MED ORDER — ZOSTER VACCINE LIVE 19400 UNT/0.65ML ~~LOC~~ SOLR: 0.6500 mL | Freq: Once | SUBCUTANEOUS | Status: AC

## 2011-09-20 MED ORDER — METOPROLOL SUCCINATE ER 100 MG PO TB24: 100.0000 mg | ORAL_TABLET | Freq: Every day | ORAL | Status: DC

## 2011-09-20 MED ORDER — PRAVASTATIN SODIUM 20 MG PO TABS: 20.0000 mg | ORAL_TABLET | Freq: Every day | ORAL | Status: DC

## 2011-09-20 MED ORDER — PANTOPRAZOLE SODIUM 40 MG PO TBEC: 40.0000 mg | DELAYED_RELEASE_TABLET | Freq: Every day | ORAL | Status: DC

## 2011-09-20 MED ORDER — CEPHALEXIN 500 MG PO CAPS: 500.0000 mg | ORAL_CAPSULE | Freq: Three times a day (TID) | ORAL | Status: AC

## 2011-09-20 MED ORDER — PNEUMOCOCCAL VAC POLYVALENT 25 MCG/0.5ML IJ INJ: 0.5000 mL | INJECTION | Freq: Once | INTRAMUSCULAR | Status: DC

## 2011-09-20 NOTE — Assessment & Plan Note (Signed)
More fragile bruising seen on forearms.  Asked patient and her daughter to discuss with their cardiologist risks vs benefits and possibly stopping plavix

## 2011-09-20 NOTE — Progress Notes (Signed)
  Subjective:    Patient ID: Rebecca [name], female    DOB: 01/01/1941, 71 y.o.   MRN: [id number]  HPI  Here for physical exam and back pain  Back pain:  Chronic, has tried lidoderm which helps.  Her daughter asks about the hydrocodone she got from a dental procedure.  Does not limit activities.  No radiation, weakness.  Dysuria: was treated for UTI, improved, but now again with dysuria x 1 week, urinary hesitancy whic is somewhat chronic.  Review of Systems    see HPI Objective:   Physical Exam  GEN: Alert & Oriented, No acute distress, here with her mother. CV:  Regular Rate & Rhythm, no murmur Respiratory:  Normal work of breathing, CTAB Psych:  tangential       Assessment & Plan:  Prevention:  Gave zostavax rx, gave pneumovax.  Will refer for colonoscopy given no screening in 15 years, history of repeated SBO, and brother with colon cancer.

## 2011-09-20 NOTE — Progress Notes (Signed)
Addended by: Deno Etienne on: 09/20/2011 05:53 PM   Modules accepted: Orders

## 2011-09-20 NOTE — Patient Instructions (Signed)
Will check urine today- I will call [name] with results  Take Tylenol CR 650 mg three times a day to help with pain- if it continues- we will work on finding another stronger medication  Will refer you to gastroenterologist to discuss colonoscopy needs  Make follow-up for follow-up and fasting labwork at the same time

## 2011-09-20 NOTE — Assessment & Plan Note (Signed)
Will increase tylenol to tid, has been on lidoderm patch in past, may use prn.

## 2011-09-20 NOTE — Assessment & Plan Note (Signed)
Will return for fasting blood work at follow-up appointment in 1-2 months

## 2011-09-20 NOTE — Assessment & Plan Note (Addendum)
Appears to have another urinary tract infection- recurrence, not treatment failure,  Will treat with keflex and send for culture.  If continues- may consider role or incomplete bladder emptying may have on this.  Encouraged her to titrate back on hydroxyzine which may be exacerbating this.  Update:  Culture resistant to 1st gen ceph.  will switch to bactrim.  Left message on Amanda's voicemail.

## 2011-09-23 DIAGNOSIS — N184 Chronic kidney disease, stage 4 (severe): Secondary | ICD-10-CM | POA: Diagnosis not present

## 2011-09-23 DIAGNOSIS — I129 Hypertensive chronic kidney disease with stage 1 through stage 4 chronic kidney disease, or unspecified chronic kidney disease: Secondary | ICD-10-CM | POA: Diagnosis not present

## 2011-09-23 LAB — URINE CULTURE: Colony Count: >=100000

## 2011-09-23 MED ORDER — SULFAMETHOXAZOLE-TMP DS 800-160 MG PO TABS: 1.0000 | ORAL_TABLET | Freq: Two times a day (BID) | ORAL | Status: AC

## 2011-09-23 NOTE — Progress Notes (Signed)
Addended by: Macy Mis on: 09/23/2011 11:26 AM   Modules accepted: Orders

## 2011-10-15 ENCOUNTER — Ambulatory Visit (INDEPENDENT_AMBULATORY_CARE_PROVIDER_SITE_OTHER): Payer: Medicare Other | Admitting: Family Medicine

## 2011-10-15 ENCOUNTER — Encounter: Payer: Self-pay | Admitting: Family Medicine

## 2011-10-15 VITALS — BP 137/68 | HR 82 | Temp 98.0°F | Ht 64.0 in | Wt 176.4 lb

## 2011-10-15 DIAGNOSIS — J039 Acute tonsillitis, unspecified: Secondary | ICD-10-CM

## 2011-10-15 DIAGNOSIS — J029 Acute pharyngitis, unspecified: Secondary | ICD-10-CM | POA: Diagnosis not present

## 2011-10-15 LAB — POCT RAPID STREP A (OFFICE): Rapid Strep A Screen: NEGATIVE

## 2011-10-15 NOTE — Patient Instructions (Signed)
I recommend using saline rinses QID. No antiinflammatories with her kidney disease. No need for antibiotics today. F/u if red flags.

## 2011-10-15 NOTE — Progress Notes (Signed)
  Subjective:   Patient ID: Rebecca [name], female DOB: 1941-02-08 71 y.o. MRN: [id number] HPI:  1. Sore throat, swollen gland Onset: has been acute  Time period of: 1 day(s).  Severity is described as mild-moderate.  Course of her symptoms over time is acute. Aggravating: chewing, dentures, swallowing Alleviating: taking dentures out, cold water Associated sx/sn: pain on palpation of right neck glands, dry cough, no fever, no emesis, no sputum. No contact with children or other sick individuals.  Has been taking old keflex for 1 day.   Tobacco use: Patient is a non-smoker.   Review of Systems: Pertinent items are noted in HPI.  Labs Reviewed: Strep Negative.     Objective:   Filed Vitals:   10/15/11 1428  BP: 137/68  Pulse: 82  Temp: 98 F (36.7 C)  TempSrc: Oral  Height: 5\' 4"  (1.626 m)  Weight: 176 lb 6.4 oz (80.015 kg)   Physical Exam: General: c/f, nad, demented Lungs:  Normal respiratory effort, chest expands symmetrically. Lungs are clear to auscultation, no crackles or wheezes. Throat: normal mucosa, no exudate, uvula midline, no redness Mouth - no lesions, mucous membranes are moist, dentures Neck:  No deformities. Tenderness noted in the right submandibular area with minimal swelling.   Supple with full range of motion without pain.  Assessment & Plan:

## 2011-10-15 NOTE — Assessment & Plan Note (Signed)
I recommend using saline rinses QID. No antiinflammatories with her kidney disease. No need for antibiotics today. F/u if red flags.  

## 2011-10-22 ENCOUNTER — Telehealth: Payer: Self-pay | Admitting: Family Medicine

## 2011-10-22 ENCOUNTER — Other Ambulatory Visit: Payer: Self-pay | Admitting: Family Medicine

## 2011-10-22 ENCOUNTER — Encounter: Payer: Self-pay | Admitting: Family Medicine

## 2011-10-22 DIAGNOSIS — E119 Type 2 diabetes mellitus without complications: Secondary | ICD-10-CM | POA: Diagnosis not present

## 2011-10-22 DIAGNOSIS — J45902 Unspecified asthma with status asthmaticus: Secondary | ICD-10-CM | POA: Diagnosis not present

## 2011-10-22 LAB — CBC AND DIFFERENTIAL
Platelets: 207 10*3/uL (ref 150–399)
WBC: 9.3 10^3/mL

## 2011-10-22 LAB — HEMOGLOBIN A1C: Hgb A1c MFr Bld: 5.4 % (ref 4.0–6.0)

## 2011-10-22 NOTE — Telephone Encounter (Signed)
Can continue to take tylenol for pain, can try chloraseptic spray, cepacollozenger.  No antibiotics without evaluation- likely viral.  If having trouble swallowing can go to ER over the weekend.  Otherwise ok to be seen on Tuesday.

## 2011-10-22 NOTE — Telephone Encounter (Signed)
Hurts to talk and wants to know what to do.

## 2011-10-22 NOTE — Telephone Encounter (Signed)
Returned call to patient.  Was seen last week for "tonsillitis."  C/o throat pain and it "hurts to talk."  Patient has been using salt H2O gargles twice a day x 1 week.   Tylenol helps "a little" with pain.   No afternoon appts available and patient refusing to go to urgent care.  Requesting abx until she can be seen at her appt on Tuesday.  Will route note to Dr. Earnest Bailey for advice and call patient back.  Gaylene Brooks, RN

## 2011-10-22 NOTE — Telephone Encounter (Addendum)
10/22/11  4:46pm Returned call to patient.  No answer and will call patient back in few minutes.  Gaylene Brooks, RN 10/22/11  4:52pm Returned call to patient.  No answer and will call patient back in few minutes.  Gaylene Brooks, RN

## 2011-10-22 NOTE — Telephone Encounter (Signed)
Pt was here last week for tonsillitis and she is still in pain and not sure what to do.  Has an appt on Tues, 5/14, but wants to know what to do in the mean time,

## 2011-10-26 ENCOUNTER — Other Ambulatory Visit: Payer: Medicare Other

## 2011-10-26 ENCOUNTER — Ambulatory Visit (INDEPENDENT_AMBULATORY_CARE_PROVIDER_SITE_OTHER): Payer: Medicare Other | Admitting: Family Medicine

## 2011-10-26 ENCOUNTER — Encounter: Payer: Self-pay | Admitting: Family Medicine

## 2011-10-26 VITALS — BP 122/78 | HR 80 | Temp 97.8°F | Ht 64.0 in | Wt 174.0 lb

## 2011-10-26 DIAGNOSIS — J449 Chronic obstructive pulmonary disease, unspecified: Secondary | ICD-10-CM | POA: Diagnosis not present

## 2011-10-26 DIAGNOSIS — M129 Arthropathy, unspecified: Secondary | ICD-10-CM | POA: Diagnosis not present

## 2011-10-26 DIAGNOSIS — L299 Pruritus, unspecified: Secondary | ICD-10-CM | POA: Diagnosis not present

## 2011-10-26 DIAGNOSIS — K5909 Other constipation: Secondary | ICD-10-CM

## 2011-10-26 DIAGNOSIS — E7800 Pure hypercholesterolemia, unspecified: Secondary | ICD-10-CM | POA: Diagnosis not present

## 2011-10-26 DIAGNOSIS — E039 Hypothyroidism, unspecified: Secondary | ICD-10-CM

## 2011-10-26 DIAGNOSIS — I2510 Atherosclerotic heart disease of native coronary artery without angina pectoris: Secondary | ICD-10-CM | POA: Diagnosis not present

## 2011-10-26 DIAGNOSIS — J029 Acute pharyngitis, unspecified: Secondary | ICD-10-CM | POA: Diagnosis not present

## 2011-10-26 LAB — LIPID PANEL
HDL: 45 mg/dL (ref >39)
LDL Cholesterol: 68 mg/dL (ref 0–99)
Total CHOL/HDL Ratio: 3.5 Ratio
Triglycerides: 227 mg/dL — ABNORMAL HIGH (ref <150)

## 2011-10-26 LAB — COMPREHENSIVE METABOLIC PANEL
AST: 22 U/L (ref 0–37)
Albumin: 4.4 g/dL (ref 3.5–5.2)
Alkaline Phosphatase: 59 U/L (ref 39–117)
BUN: 36 mg/dL — ABNORMAL HIGH (ref 6–23)
Creat: 1.68 mg/dL — ABNORMAL HIGH (ref 0.50–1.10)
Glucose, Bld: 128 mg/dL — ABNORMAL HIGH (ref 70–99)
Potassium: 5 mEq/L (ref 3.5–5.3)
Total Bilirubin: 0.3 mg/dL (ref 0.3–1.2)

## 2011-10-26 MED ORDER — ZOSTER VACCINE LIVE 19400 UNT/0.65ML ~~LOC~~ SOLR: 0.6500 mL | Freq: Once | SUBCUTANEOUS | Status: AC

## 2011-10-26 MED ORDER — POLYETHYLENE GLYCOL 3350 17 GM/SCOOP PO POWD: 17.0000 g | Freq: Every day | ORAL | Status: DC

## 2011-10-26 NOTE — Assessment & Plan Note (Addendum)
Will check fasting lipids today- Will fwd to cardiologist

## 2011-10-26 NOTE — Assessment & Plan Note (Signed)
Likley psychogenic.  Hydroxyzine not help[ing.  Advised to stop hydroxyzine, concerned about possible urinary retention leading to frequent UTI, currently asymptomatic.  OK to use zyrtec prn.

## 2011-10-26 NOTE — Assessment & Plan Note (Signed)
Right sided sore throat with no oropharyngeal edema, erythema.  No cervical lymph nodes.  ? Toni Arthurs on right submandibular area.  Possible viral vs mild salivary swelling.  Advised continued supportive care- I suspect will be self- limited.

## 2011-10-26 NOTE — Assessment & Plan Note (Signed)
using only 1-2 patched per month.  Well controlled.

## 2011-10-26 NOTE — Assessment & Plan Note (Signed)
Rarely uses albuterol. (several times per year)

## 2011-10-26 NOTE — Progress Notes (Signed)
  Subjective:    Patient ID: Rebecca [name], female    DOB: 1941/03/01, 71 y.o.   MRN: [id number]  HPI Here for follow-up dysuria and chronic medical conditions.  Here with her daughter, [name]  sore throat:  2 weeks,  No trouble swallowing. No fever or uri.   Right throat. Somewhat improving.  NO worse with foods.  DIABETES  Taking and tolerating: diet conrtrolled, has been making improvements Hypoglycemic symptoms: no Visual problems: no Monitoring feet: yes Numbness/Tingling: no Diabetic Labs:  Lab Results  Component Value Date   HGBA1C 5.4 10/22/2011   HGBA1C 6.0 03/24/2011   HGBA1C 6.2 07/09/2010   Lab Results  Component Value Date   [name] 74 05/25/2010   CREATININE 1.53* 08/30/2011   Last microalbumin: No results found for this basename: MICROALBUR, [id number]   HYPERLIPIDEMIA  Diet:  following low cholesterol diet- amanda helps Exercise: No regular exercise Wt Readings from Last 3 Encounters:  10/26/11 174 lb (78.926 kg)  10/15/11 176 lb 6.4 oz (80.015 kg)  09/20/11 174 lb (78.926 kg)   ROS:  Denies RUQ pain, myalgias, or symptoms or coronary ischemia Lab Results  Component Value Date   [name] 74 05/25/2010   Lab Results  Component Value Date   CHOL 151 05/25/2010   CHOL 131 03/31/2010   CHOL 134 02/03/2009   Lab Results  Component Value Date   [name] 42 05/25/2010   [name] 38* 03/31/2010   [name] 46 02/03/2009   Lab Results  Component Value Date   [name] 176 05/25/2010   [name] 177* 03/31/2010   [name] 145 02/03/2009   Lab Results  Component Value Date   ALT 21 09/14/2010   AST 19 09/14/2010   ALKPHOS 53 09/14/2010   BILITOT 0.2* 09/14/2010        Review of Systems See HPI    Objective:   Physical Exam  GEN: Alert & Oriented, No acute distress CV:  Regular Rate & Rhythm, no murmur Respiratory:  Normal work of breathing, CTAB Abd:  + BS, soft, no tenderness to palpation Ext: no pre-tibial edema      Assessment & Plan:

## 2011-10-26 NOTE — Assessment & Plan Note (Signed)
Will check TSH today 

## 2011-10-26 NOTE — Assessment & Plan Note (Signed)
Will refer to GI for opinion on need for colonoscopy screening- brother with colon cancer

## 2011-10-26 NOTE — Patient Instructions (Signed)
Discuss with your cardiologist risk  vs benefits of continued plavix  Will get an appointment with gastroenterology to discuss need for colonoscopy  See prescription for Shingles vaccine (zostavax)  Stop hydroxyzine  Follow-up in 3-4 months or sooner if needed.

## 2011-10-26 NOTE — Assessment & Plan Note (Signed)
Patient will discuss risks vs benefits of continued plavix at next visit with cardiologist

## 2011-10-28 ENCOUNTER — Encounter: Payer: Self-pay | Admitting: Family Medicine

## 2011-11-15 DIAGNOSIS — D500 Iron deficiency anemia secondary to blood loss (chronic): Secondary | ICD-10-CM | POA: Diagnosis not present

## 2011-11-15 DIAGNOSIS — I2510 Atherosclerotic heart disease of native coronary artery without angina pectoris: Secondary | ICD-10-CM | POA: Diagnosis not present

## 2011-11-15 DIAGNOSIS — E119 Type 2 diabetes mellitus without complications: Secondary | ICD-10-CM | POA: Diagnosis not present

## 2011-11-16 ENCOUNTER — Encounter: Payer: Self-pay | Admitting: Family Medicine

## 2011-11-16 ENCOUNTER — Ambulatory Visit (INDEPENDENT_AMBULATORY_CARE_PROVIDER_SITE_OTHER): Payer: Medicare Other | Admitting: Family Medicine

## 2011-11-16 ENCOUNTER — Other Ambulatory Visit: Payer: Self-pay | Admitting: Family Medicine

## 2011-11-16 VITALS — BP 121/73 | HR 74 | Temp 97.0°F | Ht 64.0 in | Wt 180.9 lb

## 2011-11-16 DIAGNOSIS — Z800 Family history of malignant neoplasm of digestive organs: Secondary | ICD-10-CM | POA: Diagnosis not present

## 2011-11-16 DIAGNOSIS — K5901 Slow transit constipation: Secondary | ICD-10-CM | POA: Diagnosis not present

## 2011-11-16 DIAGNOSIS — K219 Gastro-esophageal reflux disease without esophagitis: Secondary | ICD-10-CM | POA: Diagnosis not present

## 2011-11-16 DIAGNOSIS — D509 Iron deficiency anemia, unspecified: Secondary | ICD-10-CM | POA: Diagnosis not present

## 2011-11-16 DIAGNOSIS — Z79899 Other long term (current) drug therapy: Secondary | ICD-10-CM | POA: Diagnosis not present

## 2011-11-16 DIAGNOSIS — J449 Chronic obstructive pulmonary disease, unspecified: Secondary | ICD-10-CM | POA: Diagnosis not present

## 2011-11-16 MED ORDER — ALBUTEROL SULFATE HFA 108 (90 BASE) MCG/ACT IN AERS: 1.0000 | INHALATION_SPRAY | RESPIRATORY_TRACT | Status: DC | PRN

## 2011-11-16 NOTE — Patient Instructions (Signed)
Thank you for coming in today. Please take the albuterol 2 puffs every 6 hours for today and tomorrow.  Use it as much as you need in addition.  If you are still feeling bad tomorrow or the next day please come back.  Call or go to the emergency room if you get worse, have trouble breathing, have chest pains, or palpitations.

## 2011-11-17 ENCOUNTER — Other Ambulatory Visit: Payer: Self-pay | Admitting: Family Medicine

## 2011-11-17 ENCOUNTER — Encounter: Payer: Self-pay | Admitting: Family Medicine

## 2011-11-17 LAB — BASIC METABOLIC PANEL: Potassium: 5.6 mmol/L — AB (ref 3.4–5.3)

## 2011-11-17 LAB — CBC AND DIFFERENTIAL
Hemoglobin: 10.3 g/dL — AB (ref 12.0–16.0)
Platelets: 186 10*3/uL (ref 150–399)
WBC: 47.6 10^3/mL

## 2011-11-17 NOTE — Assessment & Plan Note (Signed)
Patient has mild wheezing and has run out of albuterol. She appears to be clinically well today in the exam room. Her lungs are clear, and her vital signs are normal.  I discussed the risks and benefits of prednisone. The patient and her family declined prednisone at this time as her schizoaffective disorder has worsened on steroids in the past.    Plan to refill albuterol and follow closely. If not improving return to clinic for steroids and albuterol nebulizer treatment

## 2011-11-17 NOTE — Progress Notes (Signed)
Rebecca [name] is a 71 y.o. female who presents to [facility] today for one day of wheezing and congestion. Patient denies any fevers or chills or dyspnea on exertion. She is using her albuterol inhaler several times which has helped well. She denies any nausea vomiting diarrhea chest pain palpitations edema.  It has been years since her last COPD exacerbation.  She uses albuterol to control her COPD.  She has run out of albuterol and would like a refill.   PMH: Reviewed significant for COPD and schizoaffective disorder History  Substance Use Topics  . Smoking status: Former Smoker -- 1.0 packs/day for 35 years    Quit date: 04/14/2004  . Smokeless tobacco: Not on file  . Alcohol Use: Not on file   ROS as above  Medications reviewed. Current Outpatient Prescriptions  Medication Sig Dispense Refill  . acetaminophen (TYLENOL 8 HOUR) 650 MG CR tablet Take 650 mg by mouth every 8 (eight) hours as needed.        Marland Kitchen albuterol (VENTOLIN HFA) 108 (90 BASE) MCG/ACT inhaler Inhale 1-2 puffs into the lungs every 4 (four) hours as needed.  1 Inhaler  5  . aspirin 81 MG EC tablet Take 81 mg by mouth daily.        . Calcium Carbonate-Vitamin D (CALCIUM 600+D) 600-400 MG-UNIT per tablet Take 1 tablet by mouth daily.        . cetirizine (ZYRTEC) 10 MG tablet Take 1 tablet (10 mg total) by mouth daily.  30 tablet  11  . clopidogrel (PLAVIX) 75 MG tablet TAKE ONE (1) TABLET EACH DAY  30 tablet  1  . cloZAPine (CLOZARIL) 100 MG tablet Take 200 mg by mouth at bedtime. Dr. Donell Beers      . diltiazem (DILACOR XR) 180 MG 24 hr capsule Take 1 capsule (180 mg total) by mouth daily.  30 capsule  5  . docusate sodium (COLACE) 250 MG capsule Take 1 capsule (250 mg total) by mouth daily.  10 capsule  0  . ferrous sulfate 325 (65 FE) MG EC tablet Take 325 mg by mouth 2 (two) times daily.        . isosorbide mononitrate (IMDUR) 60 MG 24 hr tablet TAKE ONE TABLET (60mg ) EVERY DAY  30 tablet  11  . levothyroxine (SYNTHROID,  LEVOTHROID) 50 MCG tablet Take 1 tablet (50 mcg total) by mouth daily.  30 tablet  11  . lidocaine (LIDODERM) 5 % Place 3 patches onto the skin daily. Remove & Discard patch within 12 hours or as directed by MD  90 patch  0  . metoprolol succinate (TOPROL-XL) 100 MG 24 hr tablet Take 1 tablet (100 mg total) by mouth daily.  30 tablet  5  . nitroGLYCERIN (NITROSTAT) 0.4 MG SL tablet Place 0.4 mg under the tongue as needed.        . Omega-3 Fatty Acids (FISH OIL) 1000 MG CAPS Take 2 capsules by mouth daily.        . pantoprazole (PROTONIX) 40 MG tablet Take 1 tablet (40 mg total) by mouth daily.  30 tablet  5  . polyethylene glycol powder (MIRALAX) powder Take 17 g by mouth daily. as needed for constipation  850 g  11  . pravastatin (PRAVACHOL) 20 MG tablet Take 1 tablet (20 mg total) by mouth daily.  30 tablet  5  . triamcinolone (KENALOG) 0.5 % ointment Apply to affected areas two times a day for no more than 2 weeks       .  TRUETEST TEST test strip TEST BLOOD GLUCOSE ONCE A DAY  35 each  11   Current Facility-Administered Medications  Medication Dose Route Frequency Provider Last Rate Last Dose  . pneumococcal 23 valent vaccine (PNU-IMMUNE) injection 0.5 mL  0.5 mL Intramuscular Once Sarah T [place], MD        Exam:  BP 121/73  Pulse 74  Temp(Src) 97 F (36.1 C) (Oral)  Ht 5\' 4"  (1.626 m)  Wt 180 lb 14.4 oz (82.056 kg)  BMI 31.05 kg/m2  SpO2 96% Gen: Well NAD HEENT: EOMI,  MMM Lungs: CTABL Nl WOB Heart: RRR no MRG Abd: NABS, NT, ND Exts: Non edematous BL  LE, warm and well perfused.   No results found for this or any previous visit (from the past 72 hour(s)).

## 2011-11-22 ENCOUNTER — Encounter: Payer: Self-pay | Admitting: Family Medicine

## 2011-11-22 ENCOUNTER — Other Ambulatory Visit: Payer: Self-pay

## 2011-11-22 ENCOUNTER — Telehealth: Payer: Self-pay | Admitting: Family Medicine

## 2011-11-22 ENCOUNTER — Ambulatory Visit (HOSPITAL_COMMUNITY)
Admission: RE | Admit: 2011-11-22 | Discharge: 2011-11-22 | Disposition: A | Discharge status: Home / Self Care | Payer: Medicare Other | Source: Ambulatory Visit | Attending: Family Medicine | Admitting: Family Medicine

## 2011-11-22 ENCOUNTER — Other Ambulatory Visit: Payer: Medicare Other

## 2011-11-22 ENCOUNTER — Ambulatory Visit (INDEPENDENT_AMBULATORY_CARE_PROVIDER_SITE_OTHER): Payer: Medicare Other | Admitting: Family Medicine

## 2011-11-22 VITALS — BP 150/84 | HR 80 | Temp 97.8°F

## 2011-11-22 DIAGNOSIS — N19 Unspecified kidney failure: Secondary | ICD-10-CM

## 2011-11-22 DIAGNOSIS — R0989 Other specified symptoms and signs involving the circulatory and respiratory systems: Secondary | ICD-10-CM | POA: Insufficient documentation

## 2011-11-22 DIAGNOSIS — E875 Hyperkalemia: Secondary | ICD-10-CM | POA: Diagnosis not present

## 2011-11-22 DIAGNOSIS — R0609 Other forms of dyspnea: Secondary | ICD-10-CM | POA: Insufficient documentation

## 2011-11-22 DIAGNOSIS — R300 Dysuria: Secondary | ICD-10-CM | POA: Diagnosis not present

## 2011-11-22 LAB — BASIC METABOLIC PANEL
BUN: 36 mg/dL — ABNORMAL HIGH (ref 6–23)
Chloride: 99 mEq/L (ref 96–112)
Creat: 1.58 mg/dL — ABNORMAL HIGH (ref 0.50–1.10)
Potassium: 4.8 mEq/L (ref 3.5–5.3)

## 2011-11-22 LAB — POCT UA - MICROSCOPIC ONLY

## 2011-11-22 LAB — POCT URINALYSIS DIPSTICK
Glucose, UA: NEGATIVE
Nitrite, UA: NEGATIVE
Spec Grav, UA: 1.01
Urobilinogen, UA: 0.2

## 2011-11-22 NOTE — Progress Notes (Signed)
  Subjective:    Patient ID: Rebecca [name], female    DOB: [birth year], 71 y.o.   MRN: [id number]  HPI Called patient to come and get bloodwork drawn due to receiving notice of acute increase in Cr and K from [place] [facility].  When patient arrived for labwork, she stated she had chest pain and dysuria, was scheduled for an appointment to be seen.  Patient is a poor historian.  Daughter is here with her and can help give more history.  Lab drawn 4 days ago at [employer] office.  I do not have office notes from this visit yet.  She has been at her baseline state of health which includes intermittent muscles aches, constipation (no BM in 2 days), urinary hesitancy.  No new dyspnea, chest pain, pain with exertion.  Denies, fever, chils, abdominal pain, dyspnea Review of Systems     Objective:   Physical Exam GEN: Alert & Oriented, No acute distress, well appearing. CV:  Regular Rate & Rhythm, no murmur Respiratory:  Normal work of breathing, CTAB Abd:  + BS, soft, no tenderness to palpation Ext: no pre-tibial edema  EKG in office: NSR, no st elevation or depression, nonspec t wave abn      Assessment & Plan:

## 2011-11-22 NOTE — Telephone Encounter (Signed)
Left message on daughters voicemail, was able to speak with patient.  She cannot recall why she had blood drawn on 6/6 or at what doctor's office.  Lab results showed elevated potassium and Cr.  Asked her to come in today for lab draw to recheck.

## 2011-11-22 NOTE — Progress Notes (Signed)
BMP DONE TODAY Illona Bulman 

## 2011-11-22 NOTE — Assessment & Plan Note (Signed)
Cr above baseline, K elevated, feeling well per phone call to patient.  Will have her in the office today to recheck bloodwork.

## 2011-11-22 NOTE — Patient Instructions (Signed)
Drink more water  Will check potassium and make sure it is not too high.  If it is too high, may need to get you to come to the hospital to lower it.  Otherwise, will need to follow-up with kidney Dr. In the next month

## 2011-11-23 DIAGNOSIS — E875 Hyperkalemia: Secondary | ICD-10-CM | POA: Insufficient documentation

## 2011-11-23 NOTE — Assessment & Plan Note (Signed)
Here today for lab draw, recheck is back to baseline.  No inciting factor such as dehydration, no new meds noted.

## 2011-11-23 NOTE — Assessment & Plan Note (Addendum)
Recheck to day back at baseline.  No inciting event.  Likely just variation in setting of gradually progressing CKD.  Will continue to monitor periodically, advised cutting back on high potassium foods.

## 2011-11-24 ENCOUNTER — Encounter: Payer: Self-pay | Admitting: Family Medicine

## 2011-11-24 NOTE — Telephone Encounter (Signed)
error 

## 2011-11-25 ENCOUNTER — Ambulatory Visit (INDEPENDENT_AMBULATORY_CARE_PROVIDER_SITE_OTHER): Payer: Medicare Other | Admitting: Family Medicine

## 2011-11-25 ENCOUNTER — Encounter: Payer: Self-pay | Admitting: Family Medicine

## 2011-11-25 ENCOUNTER — Ambulatory Visit: Payer: Self-pay | Admitting: Family Medicine

## 2011-11-25 VITALS — BP 137/81 | HR 96 | Temp 98.4°F | Ht 63.0 in | Wt 176.0 lb

## 2011-11-25 DIAGNOSIS — L0231 Cutaneous abscess of buttock: Secondary | ICD-10-CM

## 2011-11-25 NOTE — Assessment & Plan Note (Signed)
Incised today, only small amount of fluid drained, area was not large enough for packing.  Dressings in place, after care instructions given.

## 2011-11-25 NOTE — Telephone Encounter (Signed)
This encounter was created in error - please disregard.

## 2011-11-25 NOTE — Patient Instructions (Signed)
I have drained boil, there may be some bleeding and drainage for 24 hours, so keep the dressing on for 24 hours.  If your pain or the size of the boil increases, please call the office.  If you have fever or chills, call the office.   You can keep a dressing on the boil for few days if you need to.  It is OK to take tylenol as needed for pain.

## 2011-11-25 NOTE — Progress Notes (Signed)
  Subjective:    Patient ID: Rebecca [name], female    DOB: 1941/05/12, 71 y.o.   MRN: [id number]  HPI  Ms. Eaker comes in complaining of a cyst on her right buttock.  She says it started as a pimple sized bump a few days ago, and has gotten larger, and hurts worse.  She has had a simililar problem like this before and it had to be drained. She denies fevers/chills/drainage.  Denies any injury to the skin.   Review of Systems Pertinent items in HPI.     Objective:   Physical Exam  Vitals reviewed. Constitutional: She appears well-developed and well-nourished. No distress.  Skin:      Procedure Note:  Informed consent obtained.  Area of abscess was cleaned with Betadine swabs.  5 cc's of 1% lidocaine used for local anesthesia.  Area was again cleaned with Betadine and draped in usual sterile fashion 15 blade scalpel used to incise area of abscess, small amount of purulent drainage came out.  10 cc's blood loss, pressure was held over area until hemostasis achieved.  Area dressed with sterile gauze and Telfa.       Assessment & Plan:

## 2011-11-26 DIAGNOSIS — E119 Type 2 diabetes mellitus without complications: Secondary | ICD-10-CM | POA: Diagnosis not present

## 2011-11-26 DIAGNOSIS — J45902 Unspecified asthma with status asthmaticus: Secondary | ICD-10-CM | POA: Diagnosis not present

## 2011-12-08 DIAGNOSIS — I10 Essential (primary) hypertension: Secondary | ICD-10-CM | POA: Diagnosis not present

## 2011-12-08 DIAGNOSIS — N189 Chronic kidney disease, unspecified: Secondary | ICD-10-CM | POA: Diagnosis not present

## 2011-12-23 ENCOUNTER — Other Ambulatory Visit: Payer: Self-pay | Admitting: *Deleted

## 2011-12-23 MED ORDER — DILTIAZEM HCL ER 180 MG PO CP24: 180.0000 mg | ORAL_CAPSULE | Freq: Every day | ORAL | Status: DC

## 2011-12-23 MED ORDER — CLOPIDOGREL BISULFATE 75 MG PO TABS: 75.0000 mg | ORAL_TABLET | Freq: Every day | ORAL | Status: DC

## 2011-12-29 DIAGNOSIS — Z79899 Other long term (current) drug therapy: Secondary | ICD-10-CM | POA: Diagnosis not present

## 2012-01-05 ENCOUNTER — Other Ambulatory Visit: Payer: Self-pay | Admitting: Family Medicine

## 2012-01-05 MED ORDER — LANCETS MISC: Status: DC

## 2012-01-05 NOTE — Telephone Encounter (Signed)
Patient needs a refill on her Lancets, has only 10 left, sent to [facility] on [address].

## 2012-01-10 DIAGNOSIS — F259 Schizoaffective disorder, unspecified: Secondary | ICD-10-CM | POA: Diagnosis not present

## 2012-01-15 IMAGING — CR DG ABDOMEN 2V
2 series · 2 of 2 positions shown · non-contrast
Comparison: 02/12/2010

CLINICAL DATA: Abdominal pain.  Acute renal failure.  Follow-up
ileus versus partial bowel obstruction.

ABDOMEN - 2 VIEW

[w abdomen upright]
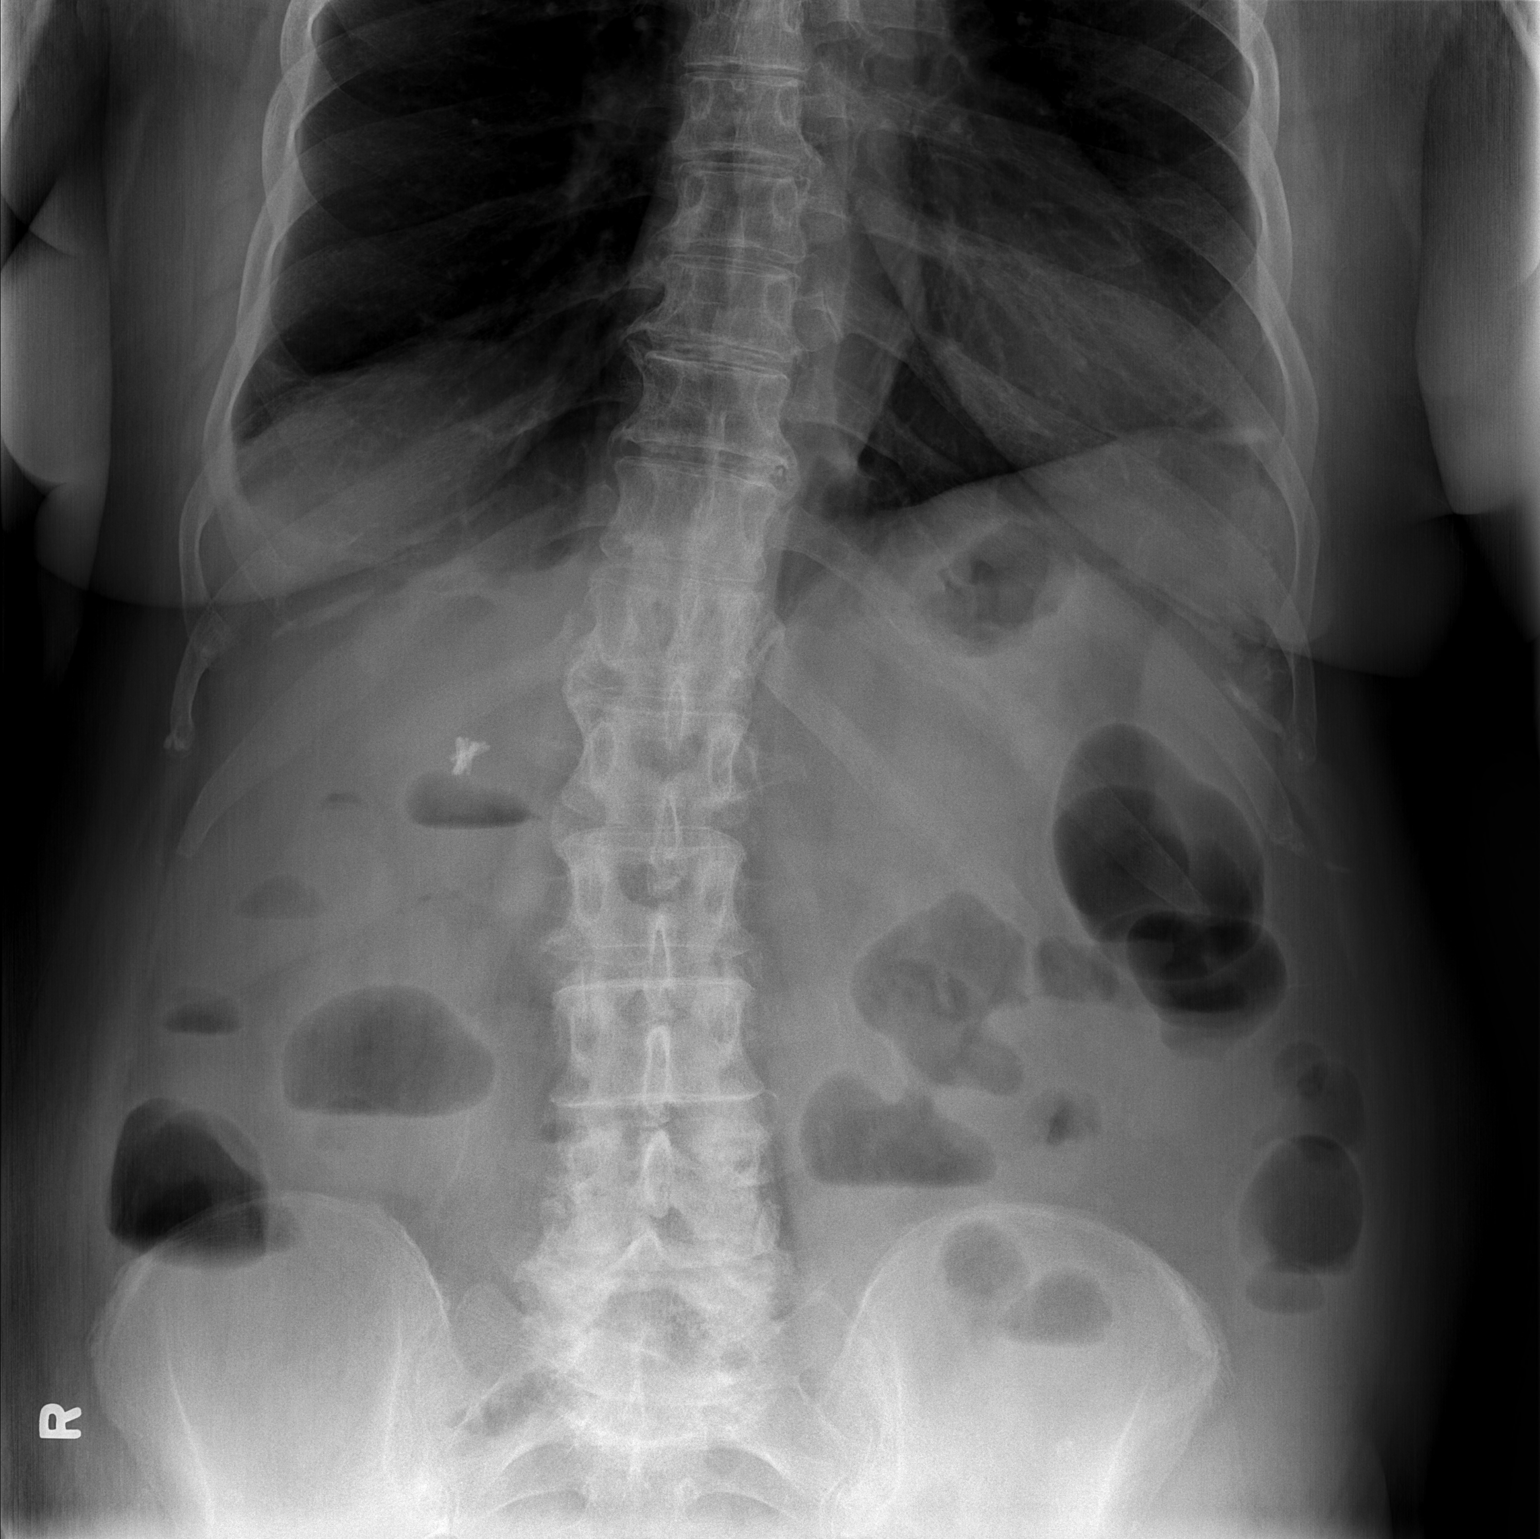

[t abdomen supine]
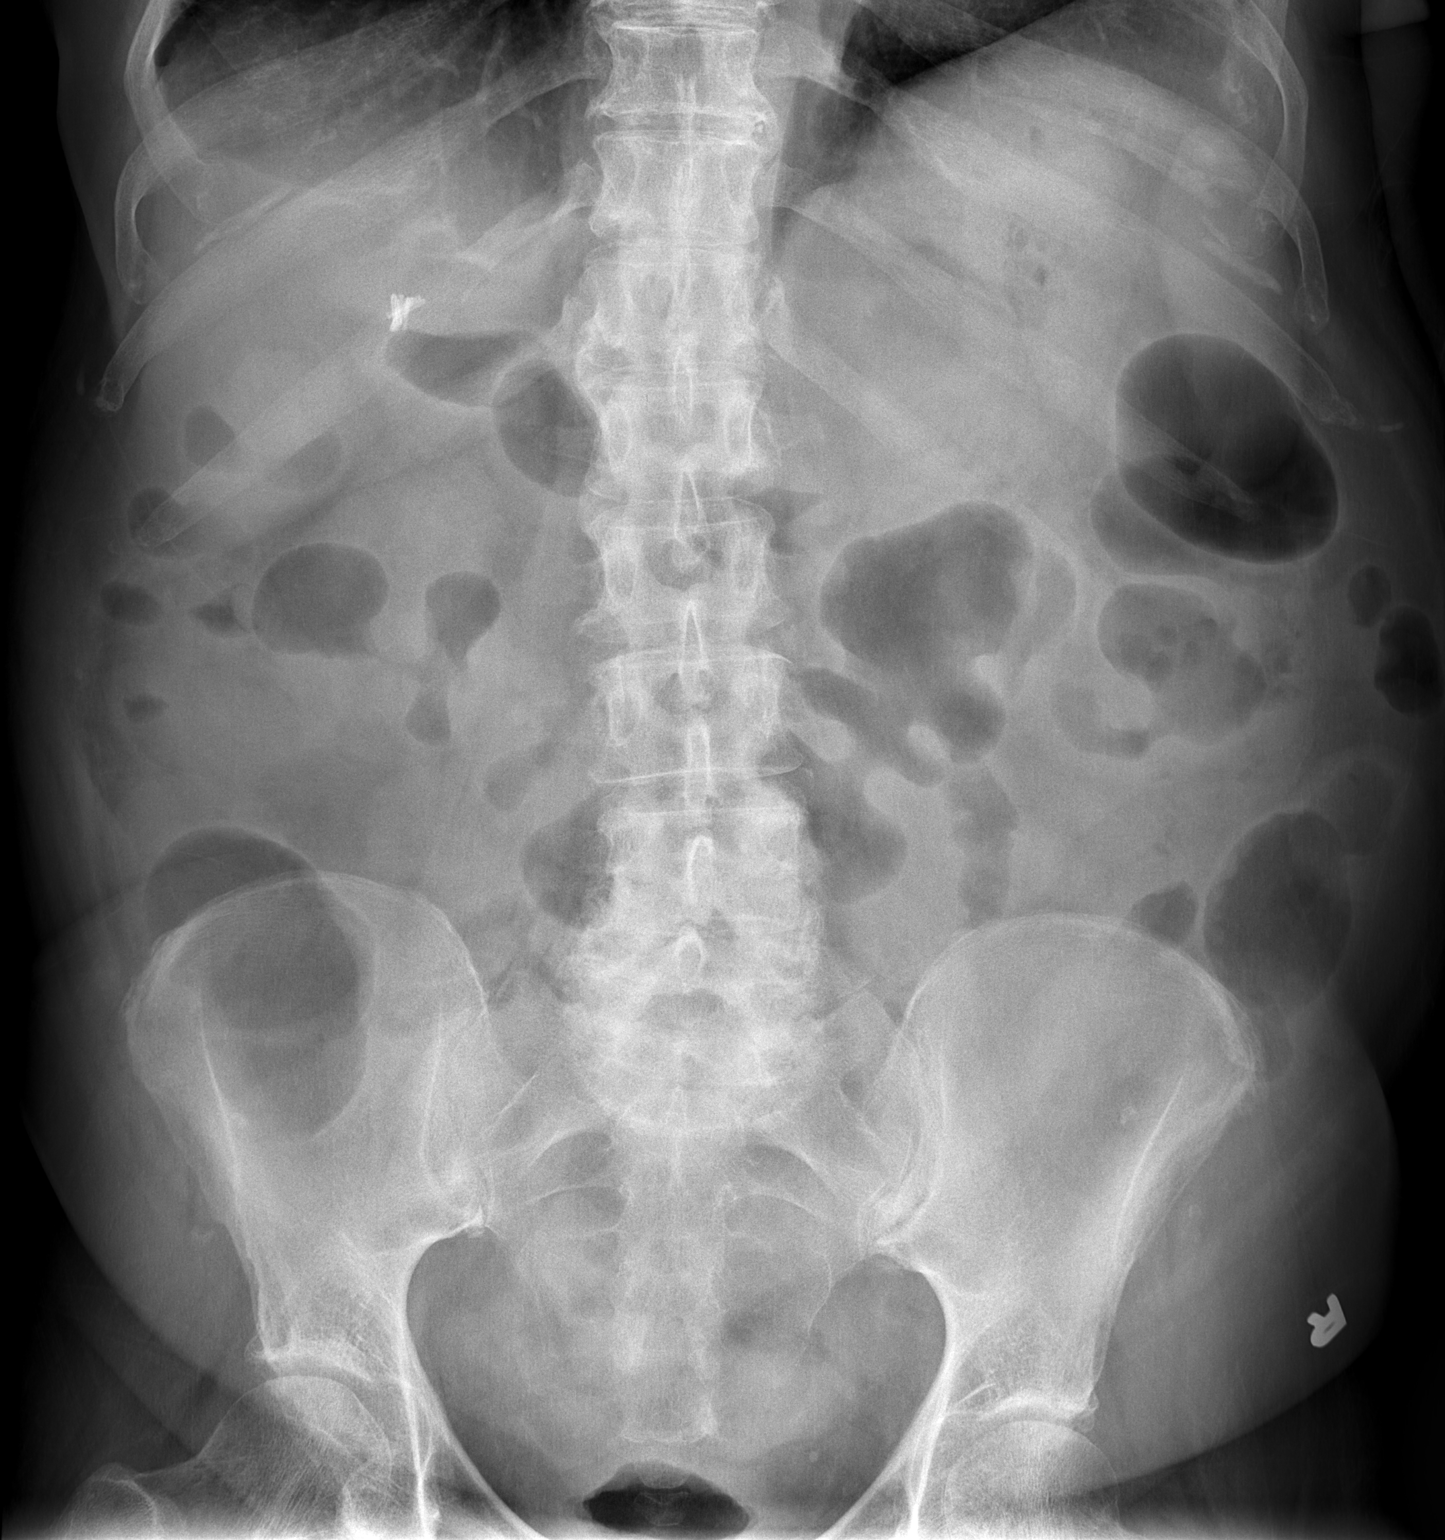

[2 of 2 positions shown; findings below may reference images not displayed]

FINDINGS: Scattered air fluid levels are seen through the colon
which is nondilated.  No evidence of dilated small bowel.
Decreased dilatation of small bowel and colon seen since prior
study.  No evidence of free air.  Small right pleural effusion
noted.
IMPRESSION: Improving ileus pattern.

## 2012-01-24 DIAGNOSIS — Z79899 Other long term (current) drug therapy: Secondary | ICD-10-CM | POA: Diagnosis not present

## 2012-01-26 ENCOUNTER — Other Ambulatory Visit: Payer: Self-pay | Admitting: Family Medicine

## 2012-01-26 ENCOUNTER — Encounter: Payer: Self-pay | Admitting: Family Medicine

## 2012-01-26 ENCOUNTER — Ambulatory Visit (INDEPENDENT_AMBULATORY_CARE_PROVIDER_SITE_OTHER): Payer: Medicare Other | Admitting: Family Medicine

## 2012-01-26 VITALS — BP 157/79 | HR 97 | Ht 63.0 in | Wt 182.0 lb

## 2012-01-26 DIAGNOSIS — F259 Schizoaffective disorder, unspecified: Secondary | ICD-10-CM | POA: Diagnosis not present

## 2012-01-26 DIAGNOSIS — I10 Essential (primary) hypertension: Secondary | ICD-10-CM

## 2012-01-26 DIAGNOSIS — R3989 Other symptoms and signs involving the genitourinary system: Secondary | ICD-10-CM | POA: Diagnosis not present

## 2012-01-26 DIAGNOSIS — M545 Low back pain, unspecified: Secondary | ICD-10-CM

## 2012-01-26 DIAGNOSIS — K5909 Other constipation: Secondary | ICD-10-CM

## 2012-01-26 DIAGNOSIS — R399 Unspecified symptoms and signs involving the genitourinary system: Secondary | ICD-10-CM

## 2012-01-26 DIAGNOSIS — J449 Chronic obstructive pulmonary disease, unspecified: Secondary | ICD-10-CM | POA: Diagnosis not present

## 2012-01-26 DIAGNOSIS — K5900 Constipation, unspecified: Secondary | ICD-10-CM

## 2012-01-26 DIAGNOSIS — E119 Type 2 diabetes mellitus without complications: Secondary | ICD-10-CM | POA: Diagnosis not present

## 2012-01-26 DIAGNOSIS — J4489 Other specified chronic obstructive pulmonary disease: Secondary | ICD-10-CM

## 2012-01-26 LAB — POCT GLYCOSYLATED HEMOGLOBIN (HGB A1C): Hemoglobin A1C: 5.9

## 2012-01-26 MED ORDER — TRAMADOL HCL 50 MG PO TABS: 50.0000 mg | ORAL_TABLET | Freq: Three times a day (TID) | ORAL | Status: AC | PRN

## 2012-01-26 NOTE — Patient Instructions (Addendum)
1. Start back taking Imdur  2.  Take tylenol 650 mg up to four times a day for back pain 3.  Go to [facility] imaging for back xray 4.  Use Tramadol for severe pain

## 2012-01-26 NOTE — Assessment & Plan Note (Addendum)
Difficult to obtain history as to how much it impacts her life.  Based on her daughter, unable to go to the store, limits her physical activity.  No signs of neurovascular compromise, no radiculopathy.  Will get lumbar and thoracic xray.    Encouraged to take tylenol more regularly, especially before going out.  Lidoderm not helpful.  WIll rx tramadol.  Cautioned on staying ahead of constipation.  Has history of SB obstructions.  Follow-up in 1 month

## 2012-01-26 NOTE — Progress Notes (Signed)
  Subjective:    Patient ID: Rebecca [name], female    DOB: 1941-03-24, 71 y.o.   MRN: [id number]  HPIHere for follow-up  Back pain: Chronic, not taking tylenol or lidoderm patches regularly.  Denies any pain now, does not give history of pain impacting life- poor historian.  Daughter states has to sit down while at the store due to pain, feels it is limiting her physical activities.  Lidoderm patches not effective.  Not regularly taking tylenol.  Headache:  Two weeks of headache, daily.  Sharp, leaves an ache.  "Happens and then goes away" No numbness, tingling, changes in vision.    Hypertension:  No chest pain, no dyspnea, has not been taking Imdur due to misunderstanding.     Review of Systems     Objective:   Physical Exam        Assessment & Plan:

## 2012-01-26 NOTE — Assessment & Plan Note (Signed)
May consider

## 2012-01-26 NOTE — Assessment & Plan Note (Signed)
Denies symptoms today but audible chest congestion, rhonchi on exam.  Past smoker.  Encouraged to use albuterol (has not used) and will get PFT's to objectively characterize.

## 2012-01-27 NOTE — Assessment & Plan Note (Signed)
Discussed diligence in miralax, especially now starting tramadol.  May consider amitiza in the future.

## 2012-01-27 NOTE — Assessment & Plan Note (Signed)
Elevated today.  Had previously been well controlled. Had not been taking Imdur.  Patient will restart.

## 2012-01-27 NOTE — Assessment & Plan Note (Signed)
Sensation of incomplete voiding for months. Last u/a normal.  Would like to quantify with post void residual.  Is now off hydroxyzine, but still taking double dose of zyrtec.   Advised to reduce to once daily.   Also on typical antipsychotic- no change.  Will follow-up in 1 month.  Discussed possibly getting cath PVR at that time to quantify sensation of incomplete voiding .  Advised to let me know if voiding pattern changes before follow-up

## 2012-01-28 ENCOUNTER — Ambulatory Visit
Admission: RE | Admit: 2012-01-28 | Discharge: 2012-01-28 | Disposition: A | Discharge status: Home / Self Care | Payer: Medicare Other | Source: Ambulatory Visit | Attending: Family Medicine | Admitting: Family Medicine

## 2012-01-28 ENCOUNTER — Other Ambulatory Visit: Payer: Self-pay | Admitting: Family Medicine

## 2012-01-28 DIAGNOSIS — M5137 Other intervertebral disc degeneration, lumbosacral region: Secondary | ICD-10-CM | POA: Diagnosis not present

## 2012-01-28 DIAGNOSIS — M545 Low back pain: Secondary | ICD-10-CM

## 2012-01-28 DIAGNOSIS — IMO0002 Reserved for concepts with insufficient information to code with codable children: Secondary | ICD-10-CM | POA: Diagnosis not present

## 2012-01-28 DIAGNOSIS — M47814 Spondylosis without myelopathy or radiculopathy, thoracic region: Secondary | ICD-10-CM | POA: Diagnosis not present

## 2012-01-28 DIAGNOSIS — M47817 Spondylosis without myelopathy or radiculopathy, lumbosacral region: Secondary | ICD-10-CM | POA: Diagnosis not present

## 2012-02-01 ENCOUNTER — Telehealth: Payer: Self-pay | Admitting: *Deleted

## 2012-02-01 NOTE — Telephone Encounter (Signed)
Message copied by Deno Etienne on Tue Feb 01, 2012 12:21 PM ------      Message from: Macy Mis      Created: Fri Jan 28, 2012  5:05 PM       Please let patient know back xray showed no serious abnormalities- only showed some "wear and tear".            ----- Message -----         From: Rad Results In Interface         Sent: 01/28/2012   2:59 PM           To: Macy Mis, MD

## 2012-02-01 NOTE — Telephone Encounter (Signed)
Called and gave pt's daughter the results of xray of her back. She voiced understanding.Loralee Pacas [place]

## 2012-02-08 ENCOUNTER — Encounter: Payer: Self-pay | Admitting: Pharmacist

## 2012-02-08 ENCOUNTER — Ambulatory Visit (INDEPENDENT_AMBULATORY_CARE_PROVIDER_SITE_OTHER): Payer: Medicare Other | Admitting: Pharmacist

## 2012-02-08 VITALS — BP 145/63 | HR 88 | Ht 64.0 in | Wt 183.0 lb

## 2012-02-08 DIAGNOSIS — J4489 Other specified chronic obstructive pulmonary disease: Secondary | ICD-10-CM

## 2012-02-08 DIAGNOSIS — J449 Chronic obstructive pulmonary disease, unspecified: Secondary | ICD-10-CM

## 2012-02-08 NOTE — Progress Notes (Signed)
  Subjective:    Patient ID: Rebecca [name], female    DOB: 10-23-1940, 71 y.o.   MRN: [id number]  HPI Pt presents for PFT, accompanied by daughter [name].  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  Complains of episodes of shortness of breath, but states she doesn't always use her inhaler for SOB.   Daughter states she is able to get outside and walk around her apt building most days of the week, and this is usually when SOB occurs. Pt appears well with no other physical complaints.   Review of Systems     Objective:   Physical Exam  See Documentation Flowsheet (discrete results - PFTs) for complete Spirometry results. Patient provided good effort while attempting spirometry.  Albuterol Neb  Lot# [id number]     Exp. 3/15        Assessment & Plan:  Spirometry evaluation reveals Severe restrictive lung disease.  Patient has been experiencing shortness of breath for about a year and taking PRN albuterol, albeit with poor adherence. continue PRN albuterol, with spacer (provided), taking 2 puffs before any exertion/walking.  Reviewed results of pulmonary function tests.  Pt verbalized understanding of results.  Written pt instructions provided; demonstrated and confirmed patient understanding of spacer technique.  F/U Clinic visit with Dr Earnest Bailey on 9/17.   Total time in face to face counseling 45 minutes.  Patient seen with Bernadene Person, PharmD Candidate and Drue Stager, PharmD, Pharmacy Resident.

## 2012-02-08 NOTE — Patient Instructions (Addendum)
It was good to see you today.  Your lung function tests show some improvement with albuterol; start using your albuterol inhaler with the new spacer before you go on walks, and if you start to feel short of breath.  Always use 2 puffs.  Dr. Earnest Bailey will follow up with you in 3 weeks.

## 2012-02-08 NOTE — Assessment & Plan Note (Signed)
Spirometry evaluation reveals Severe restrictive lung disease.  Patient has been experiencing shortness of breath for about a year and taking PRN albuterol, albeit with poor adherence. continue PRN albuterol, with spacer (provided), taking 2 puffs before any exertion/walking.  Reviewed results of pulmonary function tests.  Pt verbalized understanding of results.  Written pt instructions provided; demonstrated and confirmed patient understanding of spacer technique.  F/U Clinic visit with Dr Earnest Bailey on 9/17.   Total time in face to face counseling 45 minutes.  Patient seen with Bernadene Person, PharmD Candidate and Drue Stager, PharmD, Pharmacy Resident.

## 2012-02-09 NOTE — Progress Notes (Signed)
Patient ID: Rebecca [name], female   DOB: Aug 01, 1940, 71 y.o.   MRN: [id number] Reviewed and agree with Dr. Macky Lower documentation and management.

## 2012-02-21 DIAGNOSIS — Z79899 Other long term (current) drug therapy: Secondary | ICD-10-CM | POA: Diagnosis not present

## 2012-02-23 ENCOUNTER — Encounter: Payer: Self-pay | Admitting: Family Medicine

## 2012-02-23 ENCOUNTER — Ambulatory Visit (INDEPENDENT_AMBULATORY_CARE_PROVIDER_SITE_OTHER): Payer: Medicare Other | Admitting: Family Medicine

## 2012-02-23 ENCOUNTER — Telehealth: Payer: Self-pay | Admitting: Family Medicine

## 2012-02-23 VITALS — BP 120/77 | HR 84 | Temp 97.7°F | Ht 64.0 in | Wt 180.0 lb

## 2012-02-23 DIAGNOSIS — R300 Dysuria: Secondary | ICD-10-CM

## 2012-02-23 DIAGNOSIS — N390 Urinary tract infection, site not specified: Secondary | ICD-10-CM | POA: Diagnosis not present

## 2012-02-23 LAB — POCT URINALYSIS DIPSTICK
Bilirubin, UA: NEGATIVE
Blood, UA: NEGATIVE
Glucose, UA: NEGATIVE
Ketones, UA: NEGATIVE
Leukocytes, UA: NEGATIVE
Nitrite, UA: NEGATIVE
Protein, UA: 100
Spec Grav, UA: 1.01
Urobilinogen, UA: 0.2
pH, UA: 6

## 2012-02-23 LAB — POCT UA - MICROSCOPIC ONLY

## 2012-02-23 MED ORDER — SULFAMETHOXAZOLE-TRIMETHOPRIM 800-160 MG PO TABS: 1.0000 | ORAL_TABLET | Freq: Two times a day (BID) | ORAL | Status: DC

## 2012-02-23 NOTE — Assessment & Plan Note (Signed)
UTI with 3+ bacteria. Will treat with Bactrim at this time and send urine for culture. History of resistance to cefazolin so will avoid keflex at this time. No signs of pyelonephritis. Initially concerned for SBO but fortunately patient without nausea, vomiting, and having normal bowel movements with minimal abdominal distension. Other considerations for future are working up urinary retention as previously noted by Dr. Earnest Bailey.

## 2012-02-23 NOTE — Telephone Encounter (Signed)
Daughter called and updated of antibiotic in note dated today.

## 2012-02-23 NOTE — Patient Instructions (Signed)
Dear Rebecca [name],   It was great to see you today. Thank you for coming to clinic. I am concerned you may be having a urinary tract infection. We will call with results from your urine test. I know you are prone to small bowel obstructions so I want you to watch closely for nausea, vomiting, decreased bowel movements. Also report to clinic again if fever/chills/bloody stool develop.   Please follow up in clinic as scheduled with Dr. Earnest Bailey otherwise. Please call earlier if you have any questions or concerns.   Sincerely,  Dr. Tana Conch   Health Maintenance Due  Topic Date Due  . Colonoscopy  01/31/1989  Keep your scheduled appointment

## 2012-02-23 NOTE — Telephone Encounter (Signed)
Daughter is calling for the results of her labs and if she is going to need an antibiotic.

## 2012-02-23 NOTE — Progress Notes (Signed)
Subjective:   1. Abdominal Pain-patient with history of SBO recurrent so wanted to follow up early with symptoms. History obtained with daughter Rebecca [name] and patient. Patient has been having suprapubic tenderness/pain for 3 days which she states 2/10 aching associated with dysuria and polyuria. Continues to complain of feelings of incomplete voiding as previously noted by Dr. Earnest Bailey.  No vaginal discharge or itching. Patient thinks her stomach is slightly distended. She has been able to keep down fluids without difficulty but avoided a meal last night just in case she was developing a SBO. Denies nausea/vomiting/fever/chills/fatigue/overall sick feelings. Patient has been having regular bowel movements on her colace and miralax regimen.    ROS--See HPI  Past Medical History-smoking status noted: DM, GERD, CAD, hypothyroidism, chronic constipation.  Reviewed problem list.  Medications- reviewed and updated Chief complaint-noted  Objective: BP 120/77  Pulse 84  Temp 97.7 F (36.5 C) (Oral)  Ht 5\' 4"  (1.626 m)  Wt 180 lb (81.647 kg)  BMI 30.90 kg/m2 Gen: NAD, resting comfortably in chair and easily able to get on to table.  CV: RRR no mrg Lungs: CTAB Abd: soft. Very mild distension. Suprapubic tenderness noted with pain in area when pressing on other areas of abdomen as well. No rebounding or guarding.  MSK: No CVA tenderness.   Assessment/Plan: See problem oriented charted

## 2012-02-25 ENCOUNTER — Telehealth: Payer: Self-pay | Admitting: *Deleted

## 2012-02-25 ENCOUNTER — Ambulatory Visit: Payer: Medicare Other | Admitting: Family Medicine

## 2012-02-25 ENCOUNTER — Emergency Department (HOSPITAL_COMMUNITY): Payer: Medicare Other

## 2012-02-25 ENCOUNTER — Emergency Department (HOSPITAL_COMMUNITY)
Admission: EM | Admit: 2012-02-25 | Discharge: 2012-02-25 | Disposition: A | Discharge status: Home / Self Care | Payer: Medicare Other | Attending: Emergency Medicine | Admitting: Emergency Medicine

## 2012-02-25 ENCOUNTER — Encounter (HOSPITAL_COMMUNITY): Payer: Self-pay

## 2012-02-25 DIAGNOSIS — E119 Type 2 diabetes mellitus without complications: Secondary | ICD-10-CM | POA: Insufficient documentation

## 2012-02-25 DIAGNOSIS — M79609 Pain in unspecified limb: Secondary | ICD-10-CM | POA: Diagnosis not present

## 2012-02-25 DIAGNOSIS — Z9089 Acquired absence of other organs: Secondary | ICD-10-CM | POA: Diagnosis not present

## 2012-02-25 DIAGNOSIS — Z79899 Other long term (current) drug therapy: Secondary | ICD-10-CM | POA: Insufficient documentation

## 2012-02-25 DIAGNOSIS — M11239 Other chondrocalcinosis, unspecified wrist: Secondary | ICD-10-CM | POA: Insufficient documentation

## 2012-02-25 DIAGNOSIS — Z87891 Personal history of nicotine dependence: Secondary | ICD-10-CM | POA: Insufficient documentation

## 2012-02-25 DIAGNOSIS — R51 Headache: Secondary | ICD-10-CM | POA: Insufficient documentation

## 2012-02-25 DIAGNOSIS — R296 Repeated falls: Secondary | ICD-10-CM | POA: Diagnosis not present

## 2012-02-25 DIAGNOSIS — W19XXXA Unspecified fall, initial encounter: Secondary | ICD-10-CM

## 2012-02-25 DIAGNOSIS — I509 Heart failure, unspecified: Secondary | ICD-10-CM | POA: Insufficient documentation

## 2012-02-25 DIAGNOSIS — M25569 Pain in unspecified knee: Secondary | ICD-10-CM | POA: Diagnosis not present

## 2012-02-25 LAB — PROTIME-INR: INR: 0.96 (ref 0.00–1.49)

## 2012-02-25 LAB — APTT: aPTT: 44 seconds — ABNORMAL HIGH (ref 24–37)

## 2012-02-25 NOTE — ED Notes (Signed)
Pt pt is on blood thinners, pt fell when she got to walking to fast and fell forward, pt is on blood thinners.

## 2012-02-25 NOTE — Telephone Encounter (Signed)
Patient came in to office today at 4:25pm with daughter.  Patient states she "fell and hit head on concrete today."  Patient has bruising on left hand and left knee.  No bruising noted on head.  Patient denies losing consciousness.  Patient does take Plavix.  Discussed with Dr. Earnest Bailey and advised daughter to take patient to ED to be evaluated since she hit her head and takes Plavix.  Patient may need additional testing to rule out bleeding.  Daughter verbalized understanding and will take patient to ED now.   Vitals at 4:30pm---BP: 115/70, P: 81.   Gaylene Brooks, RN

## 2012-02-25 NOTE — Progress Notes (Signed)
Orthopedic Tech Progress Note Patient Details:  Rebecca [name] 12/06/1940 [id number]  Ortho Devices Type of Ortho Device: Velcro wrist splint Ortho Device/Splint Location: (R) UE Ortho Device/Splint Interventions: Application   Jennye Moccasin 02/25/2012, 7:06 PM

## 2012-02-25 NOTE — ED Notes (Signed)
Paged Ortho for wrist splint.

## 2012-02-25 NOTE — Telephone Encounter (Signed)
Informed daughter of negative culture. Daughter currently in ED with mother after a fall-had been told to go to ED by Dr. Earnest Bailey earlier today. Daughter reports belly pain improved with antibiotics and a better bowel movement. Told daughter likely not UTI so will stop antibiotics after 3 days instead of initial prolonged course.

## 2012-02-25 NOTE — ED Provider Notes (Signed)
History     CSN: [id number]  Arrival date & time 02/25/12  1638   First MD Initiated Contact with Patient 02/25/12 1818      Chief Complaint  Patient presents with  . Fall    (Consider location/radiation/quality/duration/timing/severity/associated sxs/prior treatment) Patient is a 71 y.o. female presenting with fall. The history is provided by the patient and a relative. No language interpreter was used.  Fall The accident occurred 3 to 5 hours ago. The fall occurred while walking. She landed on concrete. There was no blood loss. The point of impact was the head and right knee (R hand). The pain is at a severity of 6/10. The pain is moderate. She was ambulatory at the scene. There was no entrapment after the fall. There was no drug use involved in the accident. There was no alcohol use involved in the accident. Pertinent negatives include no visual change, no fever, no numbness, no bowel incontinence, no nausea, no vomiting, no headaches and no loss of consciousness. The symptoms are aggravated by pressure on the injury.   71 year old patient complaining of a fall prior to arrival. States that she was exercising and walking when she tripped and fell hitting her head. Patient's complaining of right head pain with bruising, right hand pain with bruising, right knee pain without bruising. Patient went to her PCP Dr. Earnest Bailey told her to calm to the ER to be evaluated because she was on Plavix and hit her head. Past medical history CHF, diabetes, renal insufficiency, cholecystectomy, and frozen shoulder.  Past Medical History  Diagnosis Date  . CHF (congestive heart failure)   . Diabetes mellitus   . Renal insufficiency   . S/P laparoscopic cholecystectomy   . Frozen shoulder     right    Past Surgical History  Procedure Date  . Partial hysterectomy 1995    No family history on file.  History  Substance Use Topics  . Smoking status: Former Smoker -- 1.0 packs/day for 35 years   Quit date: 04/14/2004  . Smokeless tobacco: Not on file  . Alcohol Use: Not on file    OB History    Grav Para Term Preterm Abortions TAB SAB Ect Mult Living                  Review of Systems  Constitutional: Negative.  Negative for fever.  HENT: Negative.   Eyes: Negative.   Respiratory: Negative.  Negative for shortness of breath.   Cardiovascular: Negative.  Negative for chest pain and leg swelling.  Gastrointestinal: Negative.  Negative for nausea, vomiting, abdominal distention and bowel incontinence.  Genitourinary: Negative for flank pain.  Musculoskeletal: Negative for back pain and gait problem.  Neurological: Negative.  Negative for dizziness, loss of consciousness, speech difficulty, weakness, light-headedness, numbness and headaches.  Psychiatric/Behavioral: Negative.   All other systems reviewed and are negative.    Allergies  Review of patient's allergies indicates no known allergies.  Home Medications   Current Outpatient Rx  Name Route Sig Dispense Refill  . ACETAMINOPHEN ER 650 MG PO TBCR Oral Take 650 mg by mouth every 8 (eight) hours as needed.      . ALBUTEROL SULFATE HFA 108 (90 BASE) MCG/ACT IN AERS Inhalation Inhale 1-2 puffs into the lungs every 4 (four) hours as needed. 1 Inhaler 5  . ASPIRIN 81 MG PO TBEC Oral Take 81 mg by mouth daily.      Marland Kitchen CALCIUM CARBONATE-VITAMIN D 600-400 MG-UNIT PO TABS Oral Take 1  tablet by mouth daily.      Marland Kitchen CETIRIZINE HCL 10 MG PO TABS Oral Take 1 tablet (10 mg total) by mouth daily. 30 tablet 11  . CLOPIDOGREL BISULFATE 75 MG PO TABS Oral Take 1 tablet (75 mg total) by mouth daily. 30 tablet 5  . CLOZAPINE 200 MG PO TABS Oral Take 300 mg by mouth at bedtime. Dr. Donell Beers    . DILTIAZEM HCL ER 180 MG PO CP24 Oral Take 1 capsule (180 mg total) by mouth daily. 30 capsule 5  . DOCUSATE SODIUM 250 MG PO CAPS Oral Take 1 capsule (250 mg total) by mouth daily. 10 capsule 0  . FERROUS SULFATE 325 (65 FE) MG PO TBEC Oral Take  325 mg by mouth 2 (two) times daily.      . ISOSORBIDE MONONITRATE ER 60 MG PO TB24  TAKE ONE TABLET (60MG )   EVERY DAY 30 tablet 11  . LANCETS MISC  For checking cbg once daily 100 each 11  . LEVOTHYROXINE SODIUM 50 MCG PO TABS Oral Take 1 tablet (50 mcg total) by mouth daily. 30 tablet 11  . METOPROLOL SUCCINATE ER 100 MG PO TB24 Oral Take 1 tablet (100 mg total) by mouth daily. 30 tablet 5  . NITROGLYCERIN 0.4 MG SL SUBL Sublingual Place 0.4 mg under the tongue as needed.      Marland Kitchen FISH OIL 1000 MG PO CAPS Oral Take 2 capsules by mouth daily.      Marland Kitchen PANTOPRAZOLE SODIUM 40 MG PO TBEC Oral Take 1 tablet (40 mg total) by mouth daily. 30 tablet 5  . POLYETHYLENE GLYCOL 3350 PO POWD Oral Take 17 g by mouth daily. as needed for constipation 850 g 11  . PRAVASTATIN SODIUM 20 MG PO TABS Oral Take 1 tablet (20 mg total) by mouth daily. 30 tablet 5  . SULFAMETHOXAZOLE-TRIMETHOPRIM 800-160 MG PO TABS Oral Take 1 tablet by mouth 2 (two) times daily. 20 tablet 0  . TRAMADOL HCL 50 MG PO TABS      . TRIAMCINOLONE ACETONIDE 0.5 % EX OINT  Apply to affected areas two times a day for no more than 2 weeks     . TRUETEST TEST VI STRP  TEST BLOOD GLUCOSE ONCE A DAY 35 each 11    BP 146/69  Pulse 78  Temp 98.7 F (37.1 C) (Oral)  Resp 18  SpO2 99%  Physical Exam  Nursing note and vitals reviewed. Constitutional: She is oriented to person, place, and time. She appears well-developed and well-nourished.  HENT:  Head: Normocephalic and atraumatic.  Eyes: Conjunctivae normal and EOM are normal. Pupils are equal, round, and reactive to light.  Neck: Normal range of motion. Neck supple.  Cardiovascular: Normal rate.   Pulmonary/Chest: Effort normal.  Abdominal: Soft.  Musculoskeletal: Normal range of motion. She exhibits tenderness. She exhibits no edema.       Right head, right knee, right hand tenderness. Good CMS to the areas. The bruising to the right knee and good range of motion 2+ pedal pulse.    Neurological: She is alert and oriented to person, place, and time. She has normal reflexes. No cranial nerve deficit. Coordination normal.  Skin: Skin is warm and dry.  Psychiatric: She has a normal mood and affect.    ED Course  Procedures (including critical care time)  Labs Reviewed  APTT - Abnormal; Notable for the following:    aPTT 44 (*)     All other components within normal limits  PROTIME-INR   Ct Head Wo Contrast  02/25/2012  *RADIOLOGY REPORT*  Clinical Data: 71 year old female with fall and right-sided headache.  CT HEAD WITHOUT CONTRAST  Technique:  Contiguous axial images were obtained from the base of the skull through the vertex without contrast.  Comparison: 07/20/2006  Findings: A remote right cerebellar infarct is noted.  No acute intracranial abnormalities are identified, including mass lesion or mass effect, hydrocephalus, extra-axial fluid collection, midline shift, hemorrhage, or acute infarction.  The visualized bony calvarium is unremarkable.  IMPRESSION: No evidence of acute intracranial abnormality.  Remote right cerebellar infarct.   Original Report Authenticated By: Rosendo Gros, M.D.    Dg Hand Complete Right  02/25/2012  *RADIOLOGY REPORT*  Clinical Data: Fall with right hand pain.  RIGHT HAND - COMPLETE 3+ VIEW  Comparison: 09/11/2007  Findings: There is no evidence of acute fracture, subluxation or dislocation. Chondrocalcinosis within the wrist identified. There is no evidence of radiopaque foreign body. No focal bony lesions are present.  IMPRESSION: No evidence of acute abnormality.  Wrist chondrocalcinosis.   Original Report Authenticated By: Rosendo Gros, M.D.      No diagnosis found.    MDM  71 year old female coming in post fall on Plavix. CT head negative right hand x-ray negative. Right hand splint provided. She will take her home pain medication tramadol at home with supervision of her daughter. She'll use her walker for the rest of the  weekend. She will followup with her PCP on Monday. She understands to return to the ER for any severe headache, pain, dizziness or any other concerns.         Remi Haggard, NP 02/25/12 1935

## 2012-02-25 NOTE — ED Notes (Signed)
Thurston Hole, NP at bedside to assess pt.

## 2012-02-26 NOTE — ED Provider Notes (Signed)
Medical screening examination/treatment/procedure(s) were performed by non-physician practitioner and as supervising physician I was immediately available for consultation/collaboration.  Zayneb Baucum, MD 02/26/12 0041 

## 2012-02-28 ENCOUNTER — Ambulatory Visit (INDEPENDENT_AMBULATORY_CARE_PROVIDER_SITE_OTHER): Payer: Medicare Other | Admitting: Family Medicine

## 2012-02-28 ENCOUNTER — Telehealth: Payer: Self-pay | Admitting: Family Medicine

## 2012-02-28 ENCOUNTER — Encounter: Payer: Self-pay | Admitting: Family Medicine

## 2012-02-28 VITALS — BP 130/68 | HR 73 | Ht 64.0 in | Wt 182.2 lb

## 2012-02-28 DIAGNOSIS — R269 Unspecified abnormalities of gait and mobility: Secondary | ICD-10-CM | POA: Diagnosis not present

## 2012-02-28 DIAGNOSIS — Z23 Encounter for immunization: Secondary | ICD-10-CM

## 2012-02-28 DIAGNOSIS — R2681 Unsteadiness on feet: Secondary | ICD-10-CM

## 2012-02-28 NOTE — Telephone Encounter (Signed)
States that she is having a difficult time getting her room paid at [facility] because she didn't have a 3 day stay at the hospital - wants to know if the form can be filled out for [place] [facility] or [facility] - Sheliah Hatch is her 1st choice

## 2012-02-28 NOTE — Telephone Encounter (Signed)
Discuss the referral requested.  The [facility] cannot take her mom.  Was given the suggestion to check with the [facility].  Want to discuss this with you as well as your suggestion for [facility].  Was informed that the coding would be different.

## 2012-02-28 NOTE — Patient Instructions (Addendum)
Ok to take tylenol with Tramadol for pain  Please have nursing facility of your choice fax me papers to fill out.  Our goal will be to have some short term rehab, with transition back to home.  If she is unable to get qualified, I will order physical therapy to come to your home.

## 2012-02-28 NOTE — Assessment & Plan Note (Signed)
Fall due to deconditioning, currently requiring 24 hour care.  Patient would benefit from SNF for physical rehabilitation due to high risk of falls and needing 24 hour care.  Goal to transition back to independent living.  Fwd to social worker to consult how to arrange this as an outpatient.

## 2012-02-28 NOTE — Progress Notes (Signed)
  Subjective:    Patient ID: Rebecca [name], female    DOB: Apr 26, 1941, 71 y.o.   MRN: [id number]  HPI 71 yo yo here for ER follow-up for fall and head trauma (on Plavix)  Fell on head and left arm and knee.  CT head and hand show no acute injuries.   Described incident as lost balance, knee gave out while walking down steep hill.  She did not remember to bring her rolling walker or walk the less steep path to the bus stop.  Overall gradual decline in strength and coordination.  Right hand continues to be sore and swollen but improving.  No headache, LOC, or change in cognition since accident.  Since then has been staying with her daughter for 24 hour care.  Patient is requiring help with ADL's including food prep, toileting, and medication administration    Review of Systems See HPI    Objective:   Physical Exam GEN: Alert & Oriented, No acute distress CV:  Regular Rate & Rhythm, no murmur Respiratory:  Normal work of breathing, CTAB Abd:  + BS, soft, no tenderness to palpation Ext: no pre-tibial edema Psych:  At baseline cognition Gait: unsteady, slow MSK:  Right hand swollen, bruised.  Right knee with small bruise.  Right forehead with small bruise.      Assessment & Plan:

## 2012-02-29 ENCOUNTER — Telehealth: Payer: Self-pay | Admitting: Clinical

## 2012-02-29 NOTE — Telephone Encounter (Signed)
Clinical Child psychotherapist (CSW) received a referral to assist pt and daughter in finding ST rehab for pt.   CSW spoke with pt daughter and informed her with the challenges she may face in finding SNF placement due to pt not having a 3 day qualifying stay in the hospital within the past 30days. CSW did inform pt daughter that both [facility] and the [facility] facilities stated they are willing to accept pt Medicaid and would bill pt Medicare part B for PT services. Daughter states she is considering placement at [facility]. CSW did inform daughter of the lower level of care an ALF provides however did inform her that an ALF can have PT/OT services provided for pt. Pt Daughter stated she and pt will go to [facility] today to meet with the admissions team to determine whether the ALF care will be appropriate for pt. CSW has faxed an FL2 to all 3 facilities.  CSW will await a call from pt daughter with her facility preference.  Theresia Bough, MSW, Theresia Majors [phone]

## 2012-02-29 NOTE — Telephone Encounter (Signed)
Dr. Earnest Bailey,  This is the patient we we talking about yesterday. Were we going to do a FL 2 on this?

## 2012-02-29 NOTE — Telephone Encounter (Signed)
Rebecca [name] is working on paperwork and will contact patient once she finds out some information about nursing home placement.

## 2012-03-01 ENCOUNTER — Telehealth: Payer: Self-pay | Admitting: Family Medicine

## 2012-03-01 NOTE — Telephone Encounter (Signed)
Called daughter to discuss medication reconciliation for admission to Assisted Living Facility.  Meds updated

## 2012-03-02 ENCOUNTER — Telehealth: Payer: Self-pay | Admitting: Family Medicine

## 2012-03-02 ENCOUNTER — Ambulatory Visit
Admission: RE | Admit: 2012-03-02 | Discharge: 2012-03-02 | Disposition: A | Discharge status: Home / Self Care | Payer: Medicare Other | Source: Ambulatory Visit | Attending: Family Medicine | Admitting: Family Medicine

## 2012-03-02 ENCOUNTER — Telehealth: Payer: Self-pay | Admitting: Clinical

## 2012-03-02 DIAGNOSIS — Z111 Encounter for screening for respiratory tuberculosis: Secondary | ICD-10-CM

## 2012-03-02 DIAGNOSIS — Z043 Encounter for examination and observation following other accident: Secondary | ICD-10-CM | POA: Diagnosis not present

## 2012-03-02 NOTE — Telephone Encounter (Signed)
Order for cxr placed. 

## 2012-03-02 NOTE — Telephone Encounter (Signed)
Ms. Zimmerle's daughter called to ask for an order to have a screening chest xray to rule out tb.  Need this in order for her to have admittance to Assisted living facility.  Please contact her asap to inform this has been scheduled.  Will take her today if order is placed.

## 2012-03-02 NOTE — Telephone Encounter (Signed)
Clinical Social Worker (CSW) contacted pt daughter [name] to inform her that Dr. Earnest Bailey has ordered a chest xray to rule out tb. Daughter appreciative and will take pt to [facility] now.  CSW still awaiting a pasarr so that pt can be admitted into [facility].  Theresia Bough, MSW, Theresia Majors [phone]

## 2012-03-03 ENCOUNTER — Ambulatory Visit: Payer: Self-pay

## 2012-03-06 ENCOUNTER — Telehealth: Payer: Self-pay | Admitting: *Deleted

## 2012-03-06 NOTE — Telephone Encounter (Signed)
Called and Spoke with Rebecca [name].  They received order for hospital bed and mattress.  The order for the mattress was not correct.  They need it do Tourist information centre manager.  Requesting new order be written and faxed to [facility] at [phone].  Will route to Dr. Earnest Bailey to write order.  Ileana Ladd

## 2012-03-06 NOTE — Telephone Encounter (Signed)
Sent back fax received with addendum made.

## 2012-03-06 NOTE — Telephone Encounter (Signed)
Message copied by Damita Lack on Mon Mar 06, 2012 12:30 PM ------      Message from: Theresia Bough      Created: Mon Mar 06, 2012 12:22 PM       Please forward to Dr. Earnest Bailey. I'm in the hospital & chances are he may need a new order.            Thanks, Nelva Bush [phone]      ----- Message -----         From: Ileana Ladd, CMA         Sent: 03/06/2012  11:56 AM           To: Theresia Bough, LCSW            Jim with [facility] is asking to speak with you about an order they received on Ylianna Osinski.  He may be reached at [phone].            Thanks,      Lupita Leash

## 2012-03-07 ENCOUNTER — Other Ambulatory Visit: Payer: Self-pay | Admitting: Family Medicine

## 2012-03-07 ENCOUNTER — Telehealth: Payer: Self-pay | Admitting: Clinical

## 2012-03-07 NOTE — Telephone Encounter (Signed)
Clinical Child psychotherapist (CSW) informed by pt daughter that she received a call from the Scientist, research (physical sciences) stating that pt would evaluated today at 10am in pt daughter home. Pt daughter very pleased and relieved. CSW left a message for [facility] to inform them of the possibility that pt pasrr could be received today.  Theresia Bough, MSW, Theresia Majors [phone]

## 2012-03-08 ENCOUNTER — Telehealth: Payer: Self-pay | Admitting: Family Medicine

## 2012-03-08 ENCOUNTER — Telehealth: Payer: Self-pay | Admitting: Clinical

## 2012-03-08 NOTE — Telephone Encounter (Signed)
Is asking for Diagnosis of her ulcer Doristine Mango [facility] [phone]  Is coming at 1:30 to pick up script - needs DX code on script

## 2012-03-08 NOTE — Telephone Encounter (Signed)
Dr. Earnest Bailey,  can I go and give verbal orders for this

## 2012-03-08 NOTE — Telephone Encounter (Signed)
Needs orders for the home health nurse to do wound care/assessement on her leg

## 2012-03-08 NOTE — Telephone Encounter (Signed)
Clinical Social Worker (CSW) contacted pt daughter and facility to inform them that CSW received pt pasrr number this morning, pasrr #[phone]. Daughter appreciative and will proceed with having pt admitted into [facility] today.  Theresia Bough, MSW, Theresia Majors [phone]

## 2012-03-08 NOTE — Telephone Encounter (Signed)
Called and spoke with [name].  Advised patient does not have a dx of ulcer.  He states patient must have an ulcer dx in order to receive an Oceanographer.  [name] to send a mattress that would be covered for dx code Dr. Earnest Bailey wrote on order which is Gait instability. Rebecca [name]

## 2012-03-09 ENCOUNTER — Telehealth: Payer: Self-pay | Admitting: Family Medicine

## 2012-03-09 DIAGNOSIS — M6281 Muscle weakness (generalized): Secondary | ICD-10-CM | POA: Diagnosis not present

## 2012-03-09 DIAGNOSIS — I129 Hypertensive chronic kidney disease with stage 1 through stage 4 chronic kidney disease, or unspecified chronic kidney disease: Secondary | ICD-10-CM | POA: Diagnosis not present

## 2012-03-09 DIAGNOSIS — M545 Low back pain: Secondary | ICD-10-CM | POA: Diagnosis not present

## 2012-03-09 DIAGNOSIS — R1311 Dysphagia, oral phase: Secondary | ICD-10-CM | POA: Diagnosis not present

## 2012-03-09 DIAGNOSIS — F259 Schizoaffective disorder, unspecified: Secondary | ICD-10-CM | POA: Diagnosis not present

## 2012-03-09 DIAGNOSIS — J449 Chronic obstructive pulmonary disease, unspecified: Secondary | ICD-10-CM | POA: Diagnosis not present

## 2012-03-09 DIAGNOSIS — F0391 Unspecified dementia with behavioral disturbance: Secondary | ICD-10-CM | POA: Diagnosis not present

## 2012-03-09 DIAGNOSIS — R269 Unspecified abnormalities of gait and mobility: Secondary | ICD-10-CM | POA: Diagnosis not present

## 2012-03-09 DIAGNOSIS — N189 Chronic kidney disease, unspecified: Secondary | ICD-10-CM | POA: Diagnosis not present

## 2012-03-09 DIAGNOSIS — K219 Gastro-esophageal reflux disease without esophagitis: Secondary | ICD-10-CM | POA: Diagnosis not present

## 2012-03-09 DIAGNOSIS — E119 Type 2 diabetes mellitus without complications: Secondary | ICD-10-CM | POA: Diagnosis not present

## 2012-03-09 NOTE — Telephone Encounter (Signed)
Spoke with Claris Gower and informed her of the verbal orders

## 2012-03-09 NOTE — Telephone Encounter (Signed)
Physician order/prescription for Overlay mattress to be completed by [facility]. [employer].  Placed in [facility]'s box for completion.

## 2012-03-09 NOTE — Telephone Encounter (Signed)
yes

## 2012-03-10 DIAGNOSIS — J449 Chronic obstructive pulmonary disease, unspecified: Secondary | ICD-10-CM | POA: Diagnosis not present

## 2012-03-10 DIAGNOSIS — M545 Low back pain: Secondary | ICD-10-CM | POA: Diagnosis not present

## 2012-03-10 DIAGNOSIS — R269 Unspecified abnormalities of gait and mobility: Secondary | ICD-10-CM | POA: Diagnosis not present

## 2012-03-10 DIAGNOSIS — F0391 Unspecified dementia with behavioral disturbance: Secondary | ICD-10-CM | POA: Diagnosis not present

## 2012-03-10 DIAGNOSIS — R1311 Dysphagia, oral phase: Secondary | ICD-10-CM | POA: Diagnosis not present

## 2012-03-10 DIAGNOSIS — E119 Type 2 diabetes mellitus without complications: Secondary | ICD-10-CM | POA: Diagnosis not present

## 2012-03-10 NOTE — Telephone Encounter (Signed)
completed

## 2012-03-13 DIAGNOSIS — M545 Low back pain: Secondary | ICD-10-CM | POA: Diagnosis not present

## 2012-03-13 DIAGNOSIS — J449 Chronic obstructive pulmonary disease, unspecified: Secondary | ICD-10-CM | POA: Diagnosis not present

## 2012-03-13 DIAGNOSIS — E119 Type 2 diabetes mellitus without complications: Secondary | ICD-10-CM | POA: Diagnosis not present

## 2012-03-13 DIAGNOSIS — R269 Unspecified abnormalities of gait and mobility: Secondary | ICD-10-CM | POA: Diagnosis not present

## 2012-03-13 DIAGNOSIS — R1311 Dysphagia, oral phase: Secondary | ICD-10-CM | POA: Diagnosis not present

## 2012-03-13 DIAGNOSIS — F0391 Unspecified dementia with behavioral disturbance: Secondary | ICD-10-CM | POA: Diagnosis not present

## 2012-03-14 ENCOUNTER — Ambulatory Visit: Payer: Self-pay

## 2012-03-14 ENCOUNTER — Telehealth: Payer: Self-pay | Admitting: Family Medicine

## 2012-03-14 DIAGNOSIS — E119 Type 2 diabetes mellitus without complications: Secondary | ICD-10-CM | POA: Diagnosis not present

## 2012-03-14 DIAGNOSIS — M545 Low back pain: Secondary | ICD-10-CM | POA: Diagnosis not present

## 2012-03-14 DIAGNOSIS — R269 Unspecified abnormalities of gait and mobility: Secondary | ICD-10-CM | POA: Diagnosis not present

## 2012-03-14 DIAGNOSIS — R1311 Dysphagia, oral phase: Secondary | ICD-10-CM | POA: Diagnosis not present

## 2012-03-14 DIAGNOSIS — J449 Chronic obstructive pulmonary disease, unspecified: Secondary | ICD-10-CM | POA: Diagnosis not present

## 2012-03-14 DIAGNOSIS — F0391 Unspecified dementia with behavioral disturbance: Secondary | ICD-10-CM | POA: Diagnosis not present

## 2012-03-14 NOTE — Telephone Encounter (Signed)
Pt is having urinary problems and spasms - needs to be seen today

## 2012-03-14 NOTE — Telephone Encounter (Signed)
Spoke with Claris Gower, she states that daughter wasn't coming in for appointment today because she didn't really didn't want to put her mother through all the trouble of going out when her nurse could get a clean cath sample at the house. Will forward back to PCP.

## 2012-03-14 NOTE — Telephone Encounter (Signed)
Would prefer patient to give sample here in our office as she is coming in this afternoon to be evaluated.  No need to check daily blood glucoses as blood sugar has been very well controlled off medicine.  Can discuss more with patient if feels strongly about checking blood sugars.

## 2012-03-14 NOTE — Telephone Encounter (Signed)
Will fax back signed order.

## 2012-03-14 NOTE — Telephone Encounter (Signed)
Returned call to patient's daughter.  Patient has been c/o poss. UTI and spasms.  Would like appt to be evaluated today.  Appt scheduled with [facility] for today at 1:30pm.  Gaylene Brooks, RN

## 2012-03-14 NOTE — Telephone Encounter (Signed)
Nurse would like an order for clean catch urine for UA C&S due to patient complaints of pain with urination.  It is ok to leave a message.  Also, the patients daughter is requesting an order to check BS every morning before breakfast.  This order would have to be faxed.  The fax # is [phone].

## 2012-03-15 ENCOUNTER — Telehealth: Payer: Self-pay | Admitting: *Deleted

## 2012-03-15 DIAGNOSIS — F0391 Unspecified dementia with behavioral disturbance: Secondary | ICD-10-CM | POA: Diagnosis not present

## 2012-03-15 DIAGNOSIS — E119 Type 2 diabetes mellitus without complications: Secondary | ICD-10-CM | POA: Diagnosis not present

## 2012-03-15 DIAGNOSIS — M545 Low back pain: Secondary | ICD-10-CM | POA: Diagnosis not present

## 2012-03-15 DIAGNOSIS — J449 Chronic obstructive pulmonary disease, unspecified: Secondary | ICD-10-CM | POA: Diagnosis not present

## 2012-03-15 DIAGNOSIS — R1311 Dysphagia, oral phase: Secondary | ICD-10-CM | POA: Diagnosis not present

## 2012-03-15 DIAGNOSIS — N390 Urinary tract infection, site not specified: Secondary | ICD-10-CM | POA: Diagnosis not present

## 2012-03-15 DIAGNOSIS — R269 Unspecified abnormalities of gait and mobility: Secondary | ICD-10-CM | POA: Diagnosis not present

## 2012-03-15 NOTE — Telephone Encounter (Signed)
Aram Beecham, OT with [facility] calling to inform Dr. Earnest Bailey that they received order for ST/ OT evaluation and they have completed the evaluation and will treat Rebecca [name] 2 x week for 3 weeks.  They will work with her for safety with her ADL.  Speech therapy will also treat Rebecca [name] 2 x week for 3 weeks.    Ileana Ladd

## 2012-03-16 ENCOUNTER — Telehealth: Payer: Self-pay | Admitting: Family Medicine

## 2012-03-16 DIAGNOSIS — R339 Retention of urine, unspecified: Secondary | ICD-10-CM

## 2012-03-16 MED ORDER — CEPHALEXIN 500 MG PO CAPS: 500.0000 mg | ORAL_CAPSULE | Freq: Three times a day (TID) | ORAL | Status: DC

## 2012-03-16 NOTE — Telephone Encounter (Signed)
Discussed u/a results with daughter-  Positive nitrite and trace LE.  Will send in Rx for keflex.  No signs of systemic illness.   Discussed recurrent UTi's with what sound s to be continued incomplete voiding despite removal of anticholinergic agents.  Will refer to urology.

## 2012-03-17 DIAGNOSIS — E119 Type 2 diabetes mellitus without complications: Secondary | ICD-10-CM | POA: Diagnosis not present

## 2012-03-17 DIAGNOSIS — M545 Low back pain: Secondary | ICD-10-CM | POA: Diagnosis not present

## 2012-03-17 DIAGNOSIS — R1311 Dysphagia, oral phase: Secondary | ICD-10-CM | POA: Diagnosis not present

## 2012-03-17 DIAGNOSIS — R269 Unspecified abnormalities of gait and mobility: Secondary | ICD-10-CM | POA: Diagnosis not present

## 2012-03-17 DIAGNOSIS — J449 Chronic obstructive pulmonary disease, unspecified: Secondary | ICD-10-CM | POA: Diagnosis not present

## 2012-03-17 DIAGNOSIS — F0391 Unspecified dementia with behavioral disturbance: Secondary | ICD-10-CM | POA: Diagnosis not present

## 2012-03-17 NOTE — Telephone Encounter (Signed)
Called [facility] to follow-up on results of urine culture.    Preliminary:  Re incubated for better growth.

## 2012-03-20 DIAGNOSIS — M545 Low back pain: Secondary | ICD-10-CM | POA: Diagnosis not present

## 2012-03-20 DIAGNOSIS — R269 Unspecified abnormalities of gait and mobility: Secondary | ICD-10-CM | POA: Diagnosis not present

## 2012-03-20 DIAGNOSIS — E119 Type 2 diabetes mellitus without complications: Secondary | ICD-10-CM | POA: Diagnosis not present

## 2012-03-20 DIAGNOSIS — J449 Chronic obstructive pulmonary disease, unspecified: Secondary | ICD-10-CM | POA: Diagnosis not present

## 2012-03-20 DIAGNOSIS — R1311 Dysphagia, oral phase: Secondary | ICD-10-CM | POA: Diagnosis not present

## 2012-03-20 DIAGNOSIS — F0391 Unspecified dementia with behavioral disturbance: Secondary | ICD-10-CM | POA: Diagnosis not present

## 2012-03-21 ENCOUNTER — Telehealth: Payer: Self-pay | Admitting: Family Medicine

## 2012-03-21 DIAGNOSIS — F0391 Unspecified dementia with behavioral disturbance: Secondary | ICD-10-CM | POA: Diagnosis not present

## 2012-03-21 DIAGNOSIS — M545 Low back pain: Secondary | ICD-10-CM | POA: Diagnosis not present

## 2012-03-21 DIAGNOSIS — J449 Chronic obstructive pulmonary disease, unspecified: Secondary | ICD-10-CM | POA: Diagnosis not present

## 2012-03-21 DIAGNOSIS — R269 Unspecified abnormalities of gait and mobility: Secondary | ICD-10-CM | POA: Diagnosis not present

## 2012-03-21 DIAGNOSIS — E119 Type 2 diabetes mellitus without complications: Secondary | ICD-10-CM | POA: Diagnosis not present

## 2012-03-21 DIAGNOSIS — R1311 Dysphagia, oral phase: Secondary | ICD-10-CM | POA: Diagnosis not present

## 2012-03-21 NOTE — Telephone Encounter (Signed)
LMOVM for callback.  Does she need a refill on the clozaril as well as the miralax.  And is she requesting the xray. Fleeger, [name]

## 2012-03-21 NOTE — Telephone Encounter (Signed)
Needs to speak to nurse or doctor about clozaril - fax # [phone]  Also wants orders for miralax daily so patient doesn't get constipated again  Yesterday pt c/o right pain - slightly swollen/puffy - wanted to know if she needs to get mobile xray

## 2012-03-22 NOTE — Telephone Encounter (Signed)
Nurse is calling back because she needs bloodwork done before the refill on Clozaril.  She would like a verbal order that needs to be drawn in order to get the refill on Clozaril.  At this point, she will run out this Friday so she needs to have this labwork as soon as possible.

## 2012-03-22 NOTE — Telephone Encounter (Signed)
Dr. Eustace Pen until 03-27-12.  Will forward to Dr. Deirdre Priest. Fleeger, [name]

## 2012-03-23 DIAGNOSIS — E119 Type 2 diabetes mellitus without complications: Secondary | ICD-10-CM | POA: Diagnosis not present

## 2012-03-23 DIAGNOSIS — R1311 Dysphagia, oral phase: Secondary | ICD-10-CM | POA: Diagnosis not present

## 2012-03-23 DIAGNOSIS — R269 Unspecified abnormalities of gait and mobility: Secondary | ICD-10-CM | POA: Diagnosis not present

## 2012-03-23 DIAGNOSIS — Z79899 Other long term (current) drug therapy: Secondary | ICD-10-CM | POA: Diagnosis not present

## 2012-03-23 DIAGNOSIS — F0391 Unspecified dementia with behavioral disturbance: Secondary | ICD-10-CM | POA: Diagnosis not present

## 2012-03-23 DIAGNOSIS — M545 Low back pain: Secondary | ICD-10-CM | POA: Diagnosis not present

## 2012-03-23 DIAGNOSIS — J449 Chronic obstructive pulmonary disease, unspecified: Secondary | ICD-10-CM | POA: Diagnosis not present

## 2012-03-23 NOTE — Telephone Encounter (Signed)
Completed and put in fax box.

## 2012-03-24 DIAGNOSIS — E119 Type 2 diabetes mellitus without complications: Secondary | ICD-10-CM | POA: Diagnosis not present

## 2012-03-24 DIAGNOSIS — F0391 Unspecified dementia with behavioral disturbance: Secondary | ICD-10-CM | POA: Diagnosis not present

## 2012-03-24 DIAGNOSIS — R269 Unspecified abnormalities of gait and mobility: Secondary | ICD-10-CM | POA: Diagnosis not present

## 2012-03-24 DIAGNOSIS — J449 Chronic obstructive pulmonary disease, unspecified: Secondary | ICD-10-CM | POA: Diagnosis not present

## 2012-03-24 DIAGNOSIS — M545 Low back pain: Secondary | ICD-10-CM | POA: Diagnosis not present

## 2012-03-24 DIAGNOSIS — R1311 Dysphagia, oral phase: Secondary | ICD-10-CM | POA: Diagnosis not present

## 2012-03-27 DIAGNOSIS — R269 Unspecified abnormalities of gait and mobility: Secondary | ICD-10-CM | POA: Diagnosis not present

## 2012-03-27 DIAGNOSIS — M545 Low back pain: Secondary | ICD-10-CM | POA: Diagnosis not present

## 2012-03-27 DIAGNOSIS — J449 Chronic obstructive pulmonary disease, unspecified: Secondary | ICD-10-CM | POA: Diagnosis not present

## 2012-03-27 DIAGNOSIS — R1311 Dysphagia, oral phase: Secondary | ICD-10-CM | POA: Diagnosis not present

## 2012-03-27 DIAGNOSIS — F0391 Unspecified dementia with behavioral disturbance: Secondary | ICD-10-CM | POA: Diagnosis not present

## 2012-03-27 DIAGNOSIS — E119 Type 2 diabetes mellitus without complications: Secondary | ICD-10-CM | POA: Diagnosis not present

## 2012-03-28 DIAGNOSIS — N390 Urinary tract infection, site not specified: Secondary | ICD-10-CM | POA: Diagnosis not present

## 2012-03-28 DIAGNOSIS — I129 Hypertensive chronic kidney disease with stage 1 through stage 4 chronic kidney disease, or unspecified chronic kidney disease: Secondary | ICD-10-CM | POA: Diagnosis not present

## 2012-03-28 DIAGNOSIS — J449 Chronic obstructive pulmonary disease, unspecified: Secondary | ICD-10-CM | POA: Diagnosis not present

## 2012-03-28 DIAGNOSIS — R1311 Dysphagia, oral phase: Secondary | ICD-10-CM | POA: Diagnosis not present

## 2012-03-28 DIAGNOSIS — R269 Unspecified abnormalities of gait and mobility: Secondary | ICD-10-CM | POA: Diagnosis not present

## 2012-03-28 DIAGNOSIS — E119 Type 2 diabetes mellitus without complications: Secondary | ICD-10-CM | POA: Diagnosis not present

## 2012-03-28 DIAGNOSIS — M545 Low back pain: Secondary | ICD-10-CM | POA: Diagnosis not present

## 2012-03-28 DIAGNOSIS — F0391 Unspecified dementia with behavioral disturbance: Secondary | ICD-10-CM | POA: Diagnosis not present

## 2012-03-29 ENCOUNTER — Other Ambulatory Visit: Payer: Self-pay | Admitting: Family Medicine

## 2012-03-29 DIAGNOSIS — M545 Low back pain: Secondary | ICD-10-CM | POA: Diagnosis not present

## 2012-03-29 DIAGNOSIS — F0391 Unspecified dementia with behavioral disturbance: Secondary | ICD-10-CM | POA: Diagnosis not present

## 2012-03-29 DIAGNOSIS — R1311 Dysphagia, oral phase: Secondary | ICD-10-CM | POA: Diagnosis not present

## 2012-03-29 DIAGNOSIS — E119 Type 2 diabetes mellitus without complications: Secondary | ICD-10-CM | POA: Diagnosis not present

## 2012-03-29 DIAGNOSIS — R269 Unspecified abnormalities of gait and mobility: Secondary | ICD-10-CM | POA: Diagnosis not present

## 2012-03-29 DIAGNOSIS — J449 Chronic obstructive pulmonary disease, unspecified: Secondary | ICD-10-CM | POA: Diagnosis not present

## 2012-03-29 MED ORDER — TRAMADOL HCL 50 MG PO TABS: 50.0000 mg | ORAL_TABLET | Freq: Two times a day (BID) | ORAL | Status: DC | PRN

## 2012-03-30 DIAGNOSIS — F0391 Unspecified dementia with behavioral disturbance: Secondary | ICD-10-CM | POA: Diagnosis not present

## 2012-03-30 DIAGNOSIS — R1311 Dysphagia, oral phase: Secondary | ICD-10-CM | POA: Diagnosis not present

## 2012-03-30 DIAGNOSIS — R269 Unspecified abnormalities of gait and mobility: Secondary | ICD-10-CM | POA: Diagnosis not present

## 2012-03-30 DIAGNOSIS — E119 Type 2 diabetes mellitus without complications: Secondary | ICD-10-CM | POA: Diagnosis not present

## 2012-03-30 DIAGNOSIS — J449 Chronic obstructive pulmonary disease, unspecified: Secondary | ICD-10-CM | POA: Diagnosis not present

## 2012-03-30 DIAGNOSIS — M545 Low back pain: Secondary | ICD-10-CM | POA: Diagnosis not present

## 2012-03-31 DIAGNOSIS — F0391 Unspecified dementia with behavioral disturbance: Secondary | ICD-10-CM | POA: Diagnosis not present

## 2012-03-31 DIAGNOSIS — E119 Type 2 diabetes mellitus without complications: Secondary | ICD-10-CM | POA: Diagnosis not present

## 2012-03-31 DIAGNOSIS — R269 Unspecified abnormalities of gait and mobility: Secondary | ICD-10-CM | POA: Diagnosis not present

## 2012-03-31 DIAGNOSIS — R1311 Dysphagia, oral phase: Secondary | ICD-10-CM | POA: Diagnosis not present

## 2012-03-31 DIAGNOSIS — J449 Chronic obstructive pulmonary disease, unspecified: Secondary | ICD-10-CM | POA: Diagnosis not present

## 2012-03-31 DIAGNOSIS — M545 Low back pain: Secondary | ICD-10-CM | POA: Diagnosis not present

## 2012-04-03 ENCOUNTER — Encounter: Payer: Self-pay | Admitting: Family Medicine

## 2012-04-03 ENCOUNTER — Telehealth: Payer: Self-pay | Admitting: Family Medicine

## 2012-04-03 NOTE — Telephone Encounter (Signed)
Fax request to Dr Earnest Bailey received saying daughter requests skilled nursing visits 2x weekly for 3 weeks until after her colonoscopy and order for 3 more weeks of OT/PT orders "to do that" "Also can we get prn HHN orders per daughter's request".   I reviewed Dr Leonie Green notes and don't see a justification for skilled nursing visits. I asked [facility] to schedule Rebecca [name] for a [facility] visit if there is a new medical issue that would justify skilled nursing visits. Requests for further therapy sessions should come from the therapists.

## 2012-04-04 ENCOUNTER — Telehealth: Payer: Self-pay | Admitting: Family Medicine

## 2012-04-04 DIAGNOSIS — J449 Chronic obstructive pulmonary disease, unspecified: Secondary | ICD-10-CM | POA: Diagnosis not present

## 2012-04-04 DIAGNOSIS — E119 Type 2 diabetes mellitus without complications: Secondary | ICD-10-CM | POA: Diagnosis not present

## 2012-04-04 DIAGNOSIS — M545 Low back pain: Secondary | ICD-10-CM | POA: Diagnosis not present

## 2012-04-04 DIAGNOSIS — R1311 Dysphagia, oral phase: Secondary | ICD-10-CM | POA: Diagnosis not present

## 2012-04-04 DIAGNOSIS — R269 Unspecified abnormalities of gait and mobility: Secondary | ICD-10-CM | POA: Diagnosis not present

## 2012-04-04 DIAGNOSIS — F0391 Unspecified dementia with behavioral disturbance: Secondary | ICD-10-CM | POA: Diagnosis not present

## 2012-04-04 NOTE — Telephone Encounter (Signed)
Left message on voicemail for Rebecca [name] to return call.

## 2012-04-04 NOTE — Telephone Encounter (Signed)
Nurse would like to speak to the nurse about some concerns that the patients daughter has.

## 2012-04-05 DIAGNOSIS — F0391 Unspecified dementia with behavioral disturbance: Secondary | ICD-10-CM | POA: Diagnosis not present

## 2012-04-05 DIAGNOSIS — M545 Low back pain: Secondary | ICD-10-CM | POA: Diagnosis not present

## 2012-04-05 DIAGNOSIS — E119 Type 2 diabetes mellitus without complications: Secondary | ICD-10-CM | POA: Diagnosis not present

## 2012-04-05 DIAGNOSIS — R269 Unspecified abnormalities of gait and mobility: Secondary | ICD-10-CM | POA: Diagnosis not present

## 2012-04-05 DIAGNOSIS — J449 Chronic obstructive pulmonary disease, unspecified: Secondary | ICD-10-CM | POA: Diagnosis not present

## 2012-04-05 DIAGNOSIS — R1311 Dysphagia, oral phase: Secondary | ICD-10-CM | POA: Diagnosis not present

## 2012-04-05 NOTE — Telephone Encounter (Signed)
Left message on Rebecca [name]'s voicemail informing of message from MD.

## 2012-04-05 NOTE — Telephone Encounter (Signed)
May give verbal order to contnue home health.   Please confirm with nurse- but patient already has albuterol for prn use- you can explain to daughter that this is the same medicine as a "breathing treatment"  If she is having worsening SOB and needs a change in treatment, please have her follow-up in the office.

## 2012-04-05 NOTE — Telephone Encounter (Signed)
Spoke with Claris Gower [facility] nurse). She states the patients daughter would like breathing treatments to be ordered as needed instead of having to bring patient to the office every time because with her weakened immune system she catches everything whenever she brings her to the office (this needs to be a signed order faxed to [phone]). She also would like a verbal order to continue home health until after patient has her colonoscopy on 11/8. Will forward to PCP

## 2012-04-06 DIAGNOSIS — F259 Schizoaffective disorder, unspecified: Secondary | ICD-10-CM | POA: Diagnosis not present

## 2012-04-10 ENCOUNTER — Ambulatory Visit (INDEPENDENT_AMBULATORY_CARE_PROVIDER_SITE_OTHER): Payer: Medicare Other | Admitting: Family Medicine

## 2012-04-10 ENCOUNTER — Encounter: Payer: Self-pay | Admitting: Family Medicine

## 2012-04-10 VITALS — BP 147/66 | HR 78 | Temp 99.3°F | Ht 64.0 in | Wt 184.5 lb

## 2012-04-10 DIAGNOSIS — R7309 Other abnormal glucose: Secondary | ICD-10-CM

## 2012-04-10 DIAGNOSIS — L299 Pruritus, unspecified: Secondary | ICD-10-CM | POA: Diagnosis not present

## 2012-04-10 DIAGNOSIS — R739 Hyperglycemia, unspecified: Secondary | ICD-10-CM

## 2012-04-10 DIAGNOSIS — M76899 Other specified enthesopathies of unspecified lower limb, excluding foot: Secondary | ICD-10-CM

## 2012-04-10 DIAGNOSIS — M79609 Pain in unspecified limb: Secondary | ICD-10-CM

## 2012-04-10 DIAGNOSIS — M79643 Pain in unspecified hand: Secondary | ICD-10-CM

## 2012-04-10 DIAGNOSIS — R300 Dysuria: Secondary | ICD-10-CM

## 2012-04-10 DIAGNOSIS — M7061 Trochanteric bursitis, right hip: Secondary | ICD-10-CM

## 2012-04-10 LAB — POCT URINALYSIS DIPSTICK
Glucose, UA: NEGATIVE
Ketones, UA: NEGATIVE
Spec Grav, UA: 1.01

## 2012-04-10 LAB — POCT UA - MICROSCOPIC ONLY

## 2012-04-10 LAB — POCT GLYCOSYLATED HEMOGLOBIN (HGB A1C): Hemoglobin A1C: 5.9

## 2012-04-10 NOTE — Progress Notes (Signed)
  Subjective:    Patient ID: Rebecca [name], female    DOB: 20-Aug-1940, 71 y.o.   MRN: [id number]  HPI 1.  Right hip pain:  Patient with chronic low back pain.  Now with pain on lateral aspect of Right hip.  Suffered fall several months ago and landed on buttocks.  Able to get up easily after fall.  Some residual soreness, but now pain has migrated to Right trochanter sight.  She was previously doing physical therapy multiple times a week but this has stopped 2-3 weeks ago b/c no longer has doctor's order.  Patient believes PT was helping to relieve much of there pain.  Tramadol provides relief.  Daughter desires further physical therapy for her.    2.  Right hand pain:  Present since she suffered a fall in September (NOT the same fall as mentioned above, fall on buttocks occurred several months prior).  Patient walking to bus stop and fell forward, landed on lateral aspect of Right hand and hit her head.  Had negative CT scan at ED.  Has had some residual soreness lateral aspect of Right 5th metacarpal but otherwise doing well.  Relieved with Tylenol.   3.  Itching:  Patient has scaly areas on arms and legs that cause itching.  Has been treated in past with Triamcinolone and desires more of this for relief.  No recent URI symptoms.  Has been out of Trimacinolone and these have recurred.   Patient's daughter requested UA from nursing staff before I saw patient due to single recent bout of diarrhea several days ago that has since resolved.  No dysuria, no polyuria, no polydipsia.  No fevers or chills.  Not treating as no symptoms, she is likely colonized from living in residential facility.     Review of Systems See HPI above for review of systems.       Objective:   Physical Exam Gen:  Alert, cooperative patient who appears stated age in no acute distress.  Vital signs reviewed. HEENT:  [place]/AT.  EOMI, PERRL.  MMM, tonsils non-erythematous, non-edematous.  Cardiac:  Regular rate and rhythm without  murmur auscultated.  Good S1/S2. Pulm:  Clear to auscultation bilaterally with good air movement.  No wheezes or rales noted.    Back - Normal skin, Spine with normal alignment and no deformity.  No tenderness to vertebral process palpation.  Paraspinous muscles are not tender and without spasm.   Range of motion is full at neck and lumbar sacral regions.  Hips:  Tenderness localized over Right greater trochanter area, no bruising noted here.  Good internal and external rotation without pain Hand:  Nontender over 5th metacarpal.  No tenderness throughout rest of hand or scaphoid bone.   Skin:  Scattered bruises noted over BL forearms.  Some scaly eczematous patches across shins and forearms.          Assessment & Plan:

## 2012-04-11 DIAGNOSIS — R269 Unspecified abnormalities of gait and mobility: Secondary | ICD-10-CM | POA: Diagnosis not present

## 2012-04-11 DIAGNOSIS — M545 Low back pain: Secondary | ICD-10-CM | POA: Diagnosis not present

## 2012-04-11 DIAGNOSIS — R1311 Dysphagia, oral phase: Secondary | ICD-10-CM | POA: Diagnosis not present

## 2012-04-11 DIAGNOSIS — M79643 Pain in unspecified hand: Secondary | ICD-10-CM | POA: Insufficient documentation

## 2012-04-11 DIAGNOSIS — M7061 Trochanteric bursitis, right hip: Secondary | ICD-10-CM | POA: Insufficient documentation

## 2012-04-11 DIAGNOSIS — F0391 Unspecified dementia with behavioral disturbance: Secondary | ICD-10-CM | POA: Diagnosis not present

## 2012-04-11 DIAGNOSIS — E119 Type 2 diabetes mellitus without complications: Secondary | ICD-10-CM | POA: Diagnosis not present

## 2012-04-11 DIAGNOSIS — J449 Chronic obstructive pulmonary disease, unspecified: Secondary | ICD-10-CM | POA: Diagnosis not present

## 2012-04-11 NOTE — Assessment & Plan Note (Signed)
Mild pain, she would likely benefit from strengthening to prevent future falls.  Order for physical therapy placed today (written on prescription pad as this had to be presented to Assisted Living where patient resides).   Also order for heating pad written on prescription pad.   Tylenol for pain relief prn - no evidence of hip fracture or trouble today on exam.

## 2012-04-11 NOTE — Assessment & Plan Note (Signed)
Has some scaly patches on arms and pre-tibial areas that look like eczema. Has used triamcinolone in past with resolution. Refilled today with warnings about thinning of skin with chronic usage.

## 2012-04-11 NOTE — Assessment & Plan Note (Signed)
No tenderness or hand pain today. Provided reassurance.

## 2012-04-12 DIAGNOSIS — F0391 Unspecified dementia with behavioral disturbance: Secondary | ICD-10-CM | POA: Diagnosis not present

## 2012-04-12 DIAGNOSIS — J449 Chronic obstructive pulmonary disease, unspecified: Secondary | ICD-10-CM | POA: Diagnosis not present

## 2012-04-12 DIAGNOSIS — R1311 Dysphagia, oral phase: Secondary | ICD-10-CM | POA: Diagnosis not present

## 2012-04-12 DIAGNOSIS — E119 Type 2 diabetes mellitus without complications: Secondary | ICD-10-CM | POA: Diagnosis not present

## 2012-04-12 DIAGNOSIS — R269 Unspecified abnormalities of gait and mobility: Secondary | ICD-10-CM | POA: Diagnosis not present

## 2012-04-12 DIAGNOSIS — M545 Low back pain: Secondary | ICD-10-CM | POA: Diagnosis not present

## 2012-04-14 DIAGNOSIS — R269 Unspecified abnormalities of gait and mobility: Secondary | ICD-10-CM | POA: Diagnosis not present

## 2012-04-14 DIAGNOSIS — E119 Type 2 diabetes mellitus without complications: Secondary | ICD-10-CM | POA: Diagnosis not present

## 2012-04-14 DIAGNOSIS — J449 Chronic obstructive pulmonary disease, unspecified: Secondary | ICD-10-CM | POA: Diagnosis not present

## 2012-04-14 DIAGNOSIS — M545 Low back pain: Secondary | ICD-10-CM | POA: Diagnosis not present

## 2012-04-14 DIAGNOSIS — F0391 Unspecified dementia with behavioral disturbance: Secondary | ICD-10-CM | POA: Diagnosis not present

## 2012-04-14 DIAGNOSIS — R1311 Dysphagia, oral phase: Secondary | ICD-10-CM | POA: Diagnosis not present

## 2012-04-16 ENCOUNTER — Encounter (HOSPITAL_COMMUNITY): Payer: Self-pay | Admitting: Physical Medicine and Rehabilitation

## 2012-04-16 ENCOUNTER — Emergency Department (HOSPITAL_COMMUNITY)
Admission: EM | Admit: 2012-04-16 | Discharge: 2012-04-16 | Disposition: A | Discharge status: Home / Self Care | Payer: Medicare Other | Attending: Emergency Medicine | Admitting: Emergency Medicine

## 2012-04-16 ENCOUNTER — Emergency Department (HOSPITAL_COMMUNITY): Payer: Medicare Other

## 2012-04-16 DIAGNOSIS — J45901 Unspecified asthma with (acute) exacerbation: Secondary | ICD-10-CM | POA: Insufficient documentation

## 2012-04-16 DIAGNOSIS — N289 Disorder of kidney and ureter, unspecified: Secondary | ICD-10-CM | POA: Diagnosis not present

## 2012-04-16 DIAGNOSIS — Z87891 Personal history of nicotine dependence: Secondary | ICD-10-CM | POA: Diagnosis not present

## 2012-04-16 DIAGNOSIS — E119 Type 2 diabetes mellitus without complications: Secondary | ICD-10-CM | POA: Insufficient documentation

## 2012-04-16 DIAGNOSIS — Z79899 Other long term (current) drug therapy: Secondary | ICD-10-CM | POA: Diagnosis not present

## 2012-04-16 DIAGNOSIS — R1031 Right lower quadrant pain: Secondary | ICD-10-CM | POA: Insufficient documentation

## 2012-04-16 DIAGNOSIS — J441 Chronic obstructive pulmonary disease with (acute) exacerbation: Secondary | ICD-10-CM | POA: Insufficient documentation

## 2012-04-16 DIAGNOSIS — R109 Unspecified abdominal pain: Secondary | ICD-10-CM

## 2012-04-16 DIAGNOSIS — I509 Heart failure, unspecified: Secondary | ICD-10-CM | POA: Diagnosis not present

## 2012-04-16 DIAGNOSIS — R339 Retention of urine, unspecified: Secondary | ICD-10-CM | POA: Diagnosis not present

## 2012-04-16 LAB — BASIC METABOLIC PANEL
CO2: 24 mEq/L (ref 19–32)
Calcium: 9.6 mg/dL (ref 8.4–10.5)
Chloride: 99 mEq/L (ref 96–112)
Sodium: 132 mEq/L — ABNORMAL LOW (ref 135–145)

## 2012-04-16 LAB — CBC WITH DIFFERENTIAL/PLATELET
Basophils Absolute: 0 10*3/uL (ref 0.0–0.1)
Eosinophils Relative: 0 % (ref 0–5)
Lymphocytes Relative: 27 % (ref 12–46)
Neutro Abs: 6.6 10*3/uL (ref 1.7–7.7)
Platelets: 173 10*3/uL (ref 150–400)
RDW: 13.1 % (ref 11.5–15.5)
WBC: 10.1 10*3/uL (ref 4.0–10.5)

## 2012-04-16 LAB — URINALYSIS, ROUTINE W REFLEX MICROSCOPIC
Glucose, UA: NEGATIVE mg/dL
Leukocytes, UA: NEGATIVE
Nitrite: NEGATIVE
Protein, ur: 100 mg/dL — AB

## 2012-04-16 LAB — URINE MICROSCOPIC-ADD ON

## 2012-04-16 MED ORDER — IOHEXOL 300 MG/ML  SOLN: 20.0000 mL | INTRAMUSCULAR | Status: AC

## 2012-04-16 MED ORDER — PREDNISONE 50 MG PO TABS: 50.0000 mg | ORAL_TABLET | Freq: Every day | ORAL | Status: DC

## 2012-04-16 MED ORDER — METHYLPREDNISOLONE SODIUM SUCC 125 MG IJ SOLR
125.0000 mg | Freq: Once | INTRAMUSCULAR | Status: AC
  Administered 2012-04-16: 125 mg via INTRAVENOUS
  Filled 2012-04-16: qty 2

## 2012-04-16 MED ORDER — ALBUTEROL SULFATE HFA 108 (90 BASE) MCG/ACT IN AERS: 6.0000 | INHALATION_SPRAY | RESPIRATORY_TRACT | Status: DC | PRN

## 2012-04-16 NOTE — ED Notes (Signed)
Pt states she lives in an assisted living for about 1 month, not able to urinate since yesterday. Pt A&Ox4, ambulatory. Pt has hx of bowel obstructions and urinary retention.

## 2012-04-16 NOTE — ED Notes (Signed)
Pt from [facility]

## 2012-04-16 NOTE — ED Notes (Signed)
Pt had one episode of liquid semisolid diarrhea stool moderate amount pt clean and EDP notified no new orders obtained

## 2012-04-16 NOTE — ED Provider Notes (Signed)
History     CSN: [id number]  Arrival date & time 04/16/12  1703   First MD Initiated Contact with Patient 04/16/12 1719      Chief Complaint  Patient presents with  . Urinary Retention  . Abdominal Pain    (Consider location/radiation/quality/duration/timing/severity/associated sxs/prior treatment) HPI Comments: This history obtained from daughter of patient, history Limited due to dementia patient. Chief complaint is urinary retention, the patient last urinated approximately 24 hours ago. This has happened before and is similar to prior episodes. She initially did not complain of any abdominal pain but exam she had suprapubic and right lower quadrant tenderness, she then endorsed that this is been going on since yesterday. She does have a history of a small bowel obstrcution about 2 years ago. Her surgical history is pertinent for a laparoscopic cholecystectomy, hysterectomy.  Patient is a 71 y.o. female presenting with abdominal pain. The history is provided by the patient and a relative. The history is limited by the condition of the patient (dementia). No language interpreter was used.  Abdominal Pain The primary symptoms of the illness include abdominal pain and shortness of breath. The primary symptoms of the illness do not include fever, fatigue, nausea, vomiting, diarrhea, hematemesis, hematochezia or dysuria. The current episode started yesterday. The onset of the illness was gradual. The problem has been gradually worsening.  The abdominal pain began yesterday. The pain came on gradually. The abdominal pain has been gradually worsening since its onset. The abdominal pain is located in the RLQ and suprapubic region. The abdominal pain does not radiate. The severity of the abdominal pain is 6/10. The abdominal pain is relieved by nothing.  The shortness of breath began more than 2 days ago. The shortness of breath developed gradually. The shortness of breath is moderate. The patient's  medical history is significant for COPD.  The patient has not had a change in bowel habit. Risk factors for an acute abdominal problem include a history of abdominal surgery. Additional symptoms associated with the illness include back pain. Symptoms associated with the illness do not include chills, anorexia or diaphoresis. Frequency: lumbar, chronic. Significant associated medical issues include diabetes and gallstones (hx cholecystectomy). Significant associated medical issues do not include PUD, GERD or inflammatory bowel disease.    Past Medical History  Diagnosis Date  . CHF (congestive heart failure)   . Diabetes mellitus   . Renal insufficiency   . S/P laparoscopic cholecystectomy   . Frozen shoulder     right    Past Surgical History  Procedure Date  . Partial hysterectomy 1995    No family history on file.  History  Substance Use Topics  . Smoking status: Former Smoker -- 1.0 packs/day for 35 years    Quit date: 04/14/2004  . Smokeless tobacco: Not on file  . Alcohol Use: No    OB History    Grav Para Term Preterm Abortions TAB SAB Ect Mult Living                  Review of Systems  Unable to perform ROS: Dementia  Constitutional: Negative for fever, chills, diaphoresis and fatigue.  Respiratory: Positive for shortness of breath.   Gastrointestinal: Positive for abdominal pain. Negative for nausea, vomiting, diarrhea, hematochezia, anorexia and hematemesis.  Genitourinary: Negative for dysuria. Frequency: lumbar, chronic.  Musculoskeletal: Positive for back pain.    Allergies  Review of patient's allergies indicates no known allergies.  Home Medications   Current Outpatient Rx  Name  Route  Sig  Dispense  Refill  . ACETAMINOPHEN ER 650 MG PO TBCR   Oral   Take 1 tablet (650 mg total) by mouth every 8 (eight) hours.         . ALBUTEROL SULFATE HFA 108 (90 BASE) MCG/ACT IN AERS   Inhalation   Inhale 1-2 puffs into the lungs every 4 (four) hours as  needed. For shortness of breath         . ASPIRIN 81 MG PO TBEC   Oral   Take 81 mg by mouth daily.          Marland Kitchen CALCIUM CARBONATE-VITAMIN D 600-400 MG-UNIT PO TABS   Oral   Take 1 tablet by mouth daily.          . CEPHALEXIN 500 MG PO CAPS   Oral   Take 1 capsule (500 mg total) by mouth 3 (three) times daily. For 7 days.  Fax to [facility] [phone]   21 capsule   0   . CETIRIZINE HCL 10 MG PO TABS   Oral   Take 1 tablet (10 mg total) by mouth every morning.   30 tablet   11   . CLOPIDOGREL BISULFATE 75 MG PO TABS   Oral   Take 1 tablet (75 mg total) by mouth daily.   30 tablet   5   . CLOZAPINE 100 MG PO TABS   Oral   Take 1.5 tablets (150 mg total) by mouth at bedtime. Prescribed by Mental Health Provider         . CRANBERRY EXTRACT 250 MG PO TABS   Oral   Take 2 tablets by mouth daily. May use 300 mg tabs if available      0   . DILTIAZEM HCL ER 180 MG PO CP24   Oral   Take 1 capsule (180 mg total) by mouth daily.   30 capsule   5   . DOCUSATE SODIUM 100 MG PO CAPS   Oral   Take 1 capsule (100 mg total) by mouth 2 (two) times daily.   10 capsule   0   . FERROUS SULFATE 325 (65 FE) MG PO TBEC   Oral   Take 325 mg by mouth 2 (two) times daily.           . ISOSORBIDE MONONITRATE ER 60 MG PO TB24   Oral   Take 60 mg by mouth daily.         Marland Kitchen LANCETS MISC      For checking cbg once daily   100 each   11   . LEVOTHYROXINE SODIUM 50 MCG PO TABS   Oral   Take 50 mcg by mouth daily.         Marland Kitchen METOPROLOL SUCCINATE ER 100 MG PO TB24      TAKE ONE (1) TABLET EACH DAY   30 tablet   3   . SENIOR MULTIVITAMIN PLUS PO TABS   Oral   Take 1 tablet by mouth daily.         Marland Kitchen NITROGLYCERIN 0.4 MG SL SUBL   Sublingual   Place 0.4 mg under the tongue as needed. For chest pain         . FISH OIL 1000 MG PO CAPS   Oral   Take 1 capsule (1,000 mg total) by mouth daily.         Marland Kitchen PANTOPRAZOLE SODIUM 40 MG PO TBEC   Oral  Take 1 tablet (40 mg total) by mouth daily.   30 tablet   5   . POLYETHYLENE GLYCOL 3350 PO POWD   Oral   Take 17 g by mouth at bedtime and may repeat dose one time if needed. for constipation   255 g      . PRAVASTATIN SODIUM 20 MG PO TABS   Oral   Take 1 tablet (20 mg total) by mouth daily.   30 tablet   5   . TRAMADOL HCL 50 MG PO TABS   Oral   Take 1 tablet (50 mg total) by mouth 2 (two) times daily as needed. For severe pain   60 tablet   11   . TRUETEST TEST VI STRP      TEST BLOOD GLUCOSE ONCE A DAY   35 each   11     BP 147/73  Pulse 70  Temp 98.2 F (36.8 C) (Oral)  Resp 20  SpO2 98%  Physical Exam  Nursing note and vitals reviewed. Constitutional: She is oriented to person, place, and time. She appears well-developed and well-nourished. No distress.  HENT:  Head: Normocephalic and atraumatic.  Mouth/Throat: No oropharyngeal exudate.  Eyes: EOM are normal. Pupils are equal, round, and reactive to light. Right eye exhibits no discharge. Left eye exhibits no discharge.  Neck: Normal range of motion. Neck supple. No JVD present.  Cardiovascular: Normal rate, regular rhythm and normal heart sounds.   Pulmonary/Chest: Effort normal. No stridor. No respiratory distress. She has wheezes (end exp, bilat). She has no rales. She exhibits no tenderness.  Abdominal: Soft. Bowel sounds are normal. She exhibits distension. There is tenderness (SP and RLQ). There is guarding (RLQ).  Musculoskeletal: Normal range of motion. She exhibits no edema and no tenderness.  Neurological: She is alert and oriented to person, place, and time. No cranial nerve deficit. She exhibits normal muscle tone.  Skin: Skin is warm and dry. No rash noted. She is not diaphoretic.  Psychiatric: She has a normal mood and affect. Her behavior is normal. Judgment and thought content normal.    ED Course  Procedures (including critical care time)  Labs Reviewed  CBC WITH DIFFERENTIAL -  Abnormal; Notable for the following:    RBC 3.33 (*)     Hemoglobin 9.7 (*)     HCT 28.9 (*)     All other components within normal limits  BASIC METABOLIC PANEL - Abnormal; Notable for the following:    Sodium 132 (*)     Glucose, Bld 118 (*)     BUN 40 (*)     Creatinine, Ser 1.62 (*)     GFR calc non Af Amer 30 (*)     GFR calc Af Amer 35 (*)     All other components within normal limits  URINALYSIS, ROUTINE W REFLEX MICROSCOPIC - Abnormal; Notable for the following:    Protein, ur 100 (*)     All other components within normal limits  URINE MICROSCOPIC-ADD ON  URINE CULTURE   Ct Abdomen Pelvis Wo Contrast  04/16/2012  *RADIOLOGY REPORT*  Clinical Data: Abdominal pain.  Urinary retention.  Renal insufficiency with creatinine of 1.6 and estimated GFR of 30. Surgical history includes cholecystectomy.  CT ABDOMEN AND PELVIS WITHOUT CONTRAST  Technique:  Multidetector CT imaging of the abdomen and pelvis was performed following the standard protocol without intravenous contrast.  Comparison: Unenhanced CT abdomen and pelvis 09/10/2010, 09/01/2009.  Findings: Normal unenhanced appearance of the liver,  spleen, adrenal glands, and left kidney.  Mild to moderate pancreatic atrophy without focal pancreatic parenchymal abnormality. Approximate 2.2 cm simple cyst arising from the lower pole of the right kidney, unchanged; no significant abnormalities involving the right kidney.  Gallbladder surgically absent.  No biliary ductal dilation.  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  No significant lymphadenopathy.  Normal appearing stomach and small bowel.  Moderate stool burden in the colon.  Scattered distal descending and sigmoid colon diverticula.  Lipoma involving the ileocecal valve.  Appendix not clearly visualized, but no pericecal inflammation.  No ascites.  Urinary bladder mildly distended and normal in appearance.  Uterus surgically absent.  No adnexal masses or free pelvic  fluid. Phleboliths low in the pelvis.  Bone window images demonstrate degenerative changes involving the lower thoracic and lumbar spine, the sacroiliac joints, and the hips.  Scarring in the left lower lobe and lingula; right lung base clear.  Heart mildly enlarged.  IMPRESSION:  1.  No acute abnormalities involving the abdomen or pelvis. 2.  Pancreatic atrophy. 3.  Moderate colonic stool burden.  Scattered distal descending sigmoid colon diverticula without evidence of acute diverticulitis.   Original Report Authenticated By: Hulan Saas, M.D.    Dg Chest 2 View  04/16/2012  *RADIOLOGY REPORT*  Clinical Data: CHF, dyspnea and diabetes.  Former smoker.  CHEST - 2 VIEW  Comparison: 03/02/2012  Findings:  There is mild cardiac enlargement.  No pleural effusion or edema. Scarring is noted in the left base.  No airspace consolidation. 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  There is mild degenerative disc disease within the lower thoracic spine.  IMPRESSION:  1.  No acute cardiopulmonary abnormalities.   Original Report Authenticated By: Signa Kell, M.D.      1. COPD exacerbation   2. Urinary retention   3. Abdominal  pain, other specified site       MDM  Bedside ultrasound of bladder revealed approximately 200 cc in the bladder. This would not completely explain the abdominal pain the patient is having, since she is also having right lower quadrant tenderness will obtain CT scan to evaluate for appendicitis versus abdominal mass that may be contributing to urinary retention. Stable at this time.  9:25 PM patient feels well, she urinated spontaneously on her own. She is now UTI, her labs are at baseline. Her CT scan of her abdomen was negative for any acute findings. She has followup with urology in several weeks but it I instructed her daughter to call tomorrow to try and bump up her appointment to further evaluate her urinary retention. I also gave them strict return  precautions if she develops worsening abdominal pain or is unable to urinate. I will give her 4 more days of prednisone for a COPD exacerbation. Her breathing status is improved and she is back to her baseline after a treatment of albuterol. Pt deemed stable for discharge. Return precautions were provided and pt expressed understanding to return to ED if any acute symptoms return. Follow up was instructed which pt also expressed understanding. All questions were answered and pt was in agreement w/ plan.         Warrick Parisian, MD 04/16/12 2127

## 2012-04-16 NOTE — ED Notes (Signed)
Pt presents to department from [facility] for evaluation of urinary retention. States unable to urinate since Saturday morning. Abdomen distended and hard to palpation. Last BM today was normal. 6/10 pain at the time. She is conscious alert and oriented x4. History of renal issues per daughter.

## 2012-04-17 DIAGNOSIS — M545 Low back pain: Secondary | ICD-10-CM | POA: Diagnosis not present

## 2012-04-17 DIAGNOSIS — F0391 Unspecified dementia with behavioral disturbance: Secondary | ICD-10-CM | POA: Diagnosis not present

## 2012-04-17 DIAGNOSIS — R1311 Dysphagia, oral phase: Secondary | ICD-10-CM | POA: Diagnosis not present

## 2012-04-17 DIAGNOSIS — R269 Unspecified abnormalities of gait and mobility: Secondary | ICD-10-CM | POA: Diagnosis not present

## 2012-04-17 DIAGNOSIS — E119 Type 2 diabetes mellitus without complications: Secondary | ICD-10-CM | POA: Diagnosis not present

## 2012-04-17 DIAGNOSIS — J449 Chronic obstructive pulmonary disease, unspecified: Secondary | ICD-10-CM | POA: Diagnosis not present

## 2012-04-18 ENCOUNTER — Ambulatory Visit: Payer: Self-pay | Admitting: Family Medicine

## 2012-04-18 DIAGNOSIS — R269 Unspecified abnormalities of gait and mobility: Secondary | ICD-10-CM | POA: Diagnosis not present

## 2012-04-18 DIAGNOSIS — E119 Type 2 diabetes mellitus without complications: Secondary | ICD-10-CM | POA: Diagnosis not present

## 2012-04-18 DIAGNOSIS — F0391 Unspecified dementia with behavioral disturbance: Secondary | ICD-10-CM | POA: Diagnosis not present

## 2012-04-18 DIAGNOSIS — R1311 Dysphagia, oral phase: Secondary | ICD-10-CM | POA: Diagnosis not present

## 2012-04-18 DIAGNOSIS — J449 Chronic obstructive pulmonary disease, unspecified: Secondary | ICD-10-CM | POA: Diagnosis not present

## 2012-04-18 DIAGNOSIS — M545 Low back pain: Secondary | ICD-10-CM | POA: Diagnosis not present

## 2012-04-18 LAB — URINE CULTURE: Colony Count: NO GROWTH

## 2012-04-19 DIAGNOSIS — M545 Low back pain: Secondary | ICD-10-CM | POA: Diagnosis not present

## 2012-04-19 DIAGNOSIS — J449 Chronic obstructive pulmonary disease, unspecified: Secondary | ICD-10-CM | POA: Diagnosis not present

## 2012-04-19 DIAGNOSIS — E119 Type 2 diabetes mellitus without complications: Secondary | ICD-10-CM | POA: Diagnosis not present

## 2012-04-19 DIAGNOSIS — R269 Unspecified abnormalities of gait and mobility: Secondary | ICD-10-CM | POA: Diagnosis not present

## 2012-04-19 DIAGNOSIS — F0391 Unspecified dementia with behavioral disturbance: Secondary | ICD-10-CM | POA: Diagnosis not present

## 2012-04-19 DIAGNOSIS — R1311 Dysphagia, oral phase: Secondary | ICD-10-CM | POA: Diagnosis not present

## 2012-04-19 DIAGNOSIS — N184 Chronic kidney disease, stage 4 (severe): Secondary | ICD-10-CM | POA: Diagnosis not present

## 2012-04-20 ENCOUNTER — Encounter: Payer: Self-pay | Admitting: Family Medicine

## 2012-04-20 DIAGNOSIS — J449 Chronic obstructive pulmonary disease, unspecified: Secondary | ICD-10-CM | POA: Diagnosis not present

## 2012-04-20 DIAGNOSIS — R1311 Dysphagia, oral phase: Secondary | ICD-10-CM | POA: Diagnosis not present

## 2012-04-20 DIAGNOSIS — R269 Unspecified abnormalities of gait and mobility: Secondary | ICD-10-CM | POA: Diagnosis not present

## 2012-04-20 DIAGNOSIS — M545 Low back pain: Secondary | ICD-10-CM | POA: Diagnosis not present

## 2012-04-20 DIAGNOSIS — F0391 Unspecified dementia with behavioral disturbance: Secondary | ICD-10-CM | POA: Diagnosis not present

## 2012-04-20 DIAGNOSIS — E119 Type 2 diabetes mellitus without complications: Secondary | ICD-10-CM | POA: Diagnosis not present

## 2012-04-20 LAB — CBC AND DIFFERENTIAL: Hemoglobin: 9.5 g/dL — AB (ref 12.0–16.0)

## 2012-04-20 LAB — BASIC METABOLIC PANEL: Creatinine: 1.6 mg/dL — AB (ref 0.5–1.1)

## 2012-04-21 ENCOUNTER — Other Ambulatory Visit: Payer: Self-pay | Admitting: Gastroenterology

## 2012-04-21 ENCOUNTER — Encounter: Payer: Self-pay | Admitting: Family Medicine

## 2012-04-21 ENCOUNTER — Ambulatory Visit (INDEPENDENT_AMBULATORY_CARE_PROVIDER_SITE_OTHER): Payer: Medicare Other | Admitting: Family Medicine

## 2012-04-21 VITALS — BP 157/64 | HR 81 | Temp 97.5°F | Wt 180.0 lb

## 2012-04-21 DIAGNOSIS — D649 Anemia, unspecified: Secondary | ICD-10-CM | POA: Diagnosis not present

## 2012-04-21 DIAGNOSIS — D126 Benign neoplasm of colon, unspecified: Secondary | ICD-10-CM | POA: Diagnosis not present

## 2012-04-21 DIAGNOSIS — R05 Cough: Secondary | ICD-10-CM

## 2012-04-21 DIAGNOSIS — K5730 Diverticulosis of large intestine without perforation or abscess without bleeding: Secondary | ICD-10-CM | POA: Diagnosis not present

## 2012-04-21 DIAGNOSIS — D1330 Benign neoplasm of unspecified part of small intestine: Secondary | ICD-10-CM | POA: Diagnosis not present

## 2012-04-21 MED ORDER — DOXYCYCLINE HYCLATE 100 MG PO TABS: 100.0000 mg | ORAL_TABLET | Freq: Two times a day (BID) | ORAL | Status: DC

## 2012-04-21 NOTE — Patient Instructions (Addendum)
Orders/Instructions for [facility] staff: - Administer Albuterol inhaler every 4 hours scheduled while awake for the next 2 days - Complete course of Prednisone today - Start taking Doxycycline 100 mg 1 tablet twice per day x 7 days - If you develop worsening shortness of breath, wheezing, worsening cough, or fever, please call doctor or return to clinic

## 2012-04-22 ENCOUNTER — Encounter: Payer: Self-pay | Admitting: Family Medicine

## 2012-04-22 DIAGNOSIS — R058 Other specified cough: Secondary | ICD-10-CM | POA: Insufficient documentation

## 2012-04-22 DIAGNOSIS — R05 Cough: Secondary | ICD-10-CM | POA: Insufficient documentation

## 2012-04-22 NOTE — Progress Notes (Signed)
  Subjective:    Patient ID: Rebecca [name], female    DOB: [birth year], 71 y.o.   MRN: [id number]  HPI  Patient presents to same day clinic for productive cough.  History provided by family member.  Patient currently lives in ALF.  Patient was recently seen in ED on 11/3 and dx with COPD exacerbation.  Per patient and family member, she was given Rx for Prednisone and is on her last day of medication.  Today, patient was at [facility] for an EGD and complained of productive cough with yellow-green sputum.  GI nursing staff was concerned about cough and recommended follow up with PCP after procedure.  Patient denies any SOB, CP at this time.  She feels much better today compared to when she was seen in ED.  Family concerned that patient is not getting better and wants to know if there is any medication we can give her in clinic.  I offered breathing tx in clinic and both patient and FM agreed, they say she does well after breathing tx given in clinic.  Denis any fevers at home, runny nose, nausea/vomiting, or decreased appetite.   Review of Systems Per HPI    Objective:   Physical Exam  Constitutional: No distress.       Very pleasant  HENT:  Mouth/Throat: Oropharynx is clear and moist.  Cardiovascular: Normal rate and normal heart sounds.   Pulmonary/Chest: Effort normal.       Poor air entry, scattered wheezes diffusely, but no rales, rhonchi, or diminished BS       Assessment & Plan:

## 2012-04-22 NOTE — Assessment & Plan Note (Addendum)
Likely secondary to recent dx COPD exacerbation on 11/3.  Now with productive cough, but no fever at this time.  Last day of Prednisone today. - Will treat with Doxycycline 100 BID - Duonebs administered in clinic today - Orders for nursing staff at ALF to give breathing treatments every 4 hours scheduled x 2 days while awake - Red flags reviewed with patient and family member and per AVS

## 2012-04-25 ENCOUNTER — Telehealth: Payer: Self-pay | Admitting: Family Medicine

## 2012-04-25 DIAGNOSIS — M545 Low back pain: Secondary | ICD-10-CM | POA: Diagnosis not present

## 2012-04-25 DIAGNOSIS — R1311 Dysphagia, oral phase: Secondary | ICD-10-CM | POA: Diagnosis not present

## 2012-04-25 DIAGNOSIS — J449 Chronic obstructive pulmonary disease, unspecified: Secondary | ICD-10-CM | POA: Diagnosis not present

## 2012-04-25 DIAGNOSIS — E119 Type 2 diabetes mellitus without complications: Secondary | ICD-10-CM | POA: Diagnosis not present

## 2012-04-25 DIAGNOSIS — R269 Unspecified abnormalities of gait and mobility: Secondary | ICD-10-CM | POA: Diagnosis not present

## 2012-04-25 DIAGNOSIS — F0391 Unspecified dementia with behavioral disturbance: Secondary | ICD-10-CM | POA: Diagnosis not present

## 2012-04-25 NOTE — Telephone Encounter (Signed)
Will forward to Dr. Briscoe. 

## 2012-04-25 NOTE — Telephone Encounter (Signed)
Charlotte at [facility] is given verbal OK to DC  [facility] and admit to OP PT per Dr Earnest Bailey.

## 2012-04-25 NOTE — Telephone Encounter (Signed)
Needs verbal orders to d/c her from home health and to admit to Out Patient PT Also needs verbal orders to pick her up under medicaid for her monthly lab draws

## 2012-04-25 NOTE — Telephone Encounter (Signed)
Ok to authorize.

## 2012-04-26 DIAGNOSIS — M545 Low back pain: Secondary | ICD-10-CM | POA: Diagnosis not present

## 2012-04-26 DIAGNOSIS — E119 Type 2 diabetes mellitus without complications: Secondary | ICD-10-CM | POA: Diagnosis not present

## 2012-04-26 DIAGNOSIS — R269 Unspecified abnormalities of gait and mobility: Secondary | ICD-10-CM | POA: Diagnosis not present

## 2012-04-26 DIAGNOSIS — F0391 Unspecified dementia with behavioral disturbance: Secondary | ICD-10-CM | POA: Diagnosis not present

## 2012-04-26 DIAGNOSIS — J449 Chronic obstructive pulmonary disease, unspecified: Secondary | ICD-10-CM | POA: Diagnosis not present

## 2012-04-26 DIAGNOSIS — R1311 Dysphagia, oral phase: Secondary | ICD-10-CM | POA: Diagnosis not present

## 2012-04-26 NOTE — ED Provider Notes (Signed)
I saw and evaluated the patient, reviewed the resident's note and I agree with the findings and plan.  Pt c/o urinary retention but able to spontaneously void. On my exam patient does have some mild suprapubic tenderness and also the right lower quadrant. No guarding or rebound. CT abdomen pelvis such an acute abnormality. Feel she is safe for discharge. Outpatient followup w/ urology.  Raeford Razor, MD 04/26/12 [phone]

## 2012-04-27 DIAGNOSIS — E119 Type 2 diabetes mellitus without complications: Secondary | ICD-10-CM | POA: Diagnosis not present

## 2012-04-28 ENCOUNTER — Encounter: Payer: Self-pay | Admitting: Family Medicine

## 2012-04-28 ENCOUNTER — Ambulatory Visit (INDEPENDENT_AMBULATORY_CARE_PROVIDER_SITE_OTHER): Payer: Medicare Other | Admitting: Family Medicine

## 2012-04-28 VITALS — BP 112/70 | HR 87 | Temp 98.6°F | Ht 64.0 in | Wt 182.6 lb

## 2012-04-28 DIAGNOSIS — K5900 Constipation, unspecified: Secondary | ICD-10-CM

## 2012-04-28 DIAGNOSIS — E1165 Type 2 diabetes mellitus with hyperglycemia: Secondary | ICD-10-CM

## 2012-04-28 DIAGNOSIS — R3989 Other symptoms and signs involving the genitourinary system: Secondary | ICD-10-CM | POA: Diagnosis not present

## 2012-04-28 DIAGNOSIS — K5909 Other constipation: Secondary | ICD-10-CM

## 2012-04-28 DIAGNOSIS — E118 Type 2 diabetes mellitus with unspecified complications: Secondary | ICD-10-CM | POA: Diagnosis not present

## 2012-04-28 DIAGNOSIS — R2681 Unsteadiness on feet: Secondary | ICD-10-CM

## 2012-04-28 DIAGNOSIS — R399 Unspecified symptoms and signs involving the genitourinary system: Secondary | ICD-10-CM

## 2012-04-28 DIAGNOSIS — R269 Unspecified abnormalities of gait and mobility: Secondary | ICD-10-CM | POA: Diagnosis not present

## 2012-04-28 NOTE — Assessment & Plan Note (Signed)
Recent colonoscopy ok per patient report.

## 2012-04-28 NOTE — Progress Notes (Signed)
  Subjective:    Patient ID: Rebecca [name], female    DOB: 08/22/1940, 71 y.o.   MRN: [id number]  HPI 71 yo here for routine follow-up  Has been doing well in assisted living facility.  No further falls since physical therapy  Daughter notes did have an inicident where she went to ER for 24 hours of urinary retention.  Able large bowel mvoement and catheter (was also treated with steroid for COPD exacerbation), resovled.  Has upcoming appt with urology for further evaluation.  Feels has been doing well with bowel regimen, has missed miralax prior to this incident.  Has had recent appt with nephrology.  No changes. Review of Systems See HPI    Objective:   Physical Exam GEN: Alert & Oriented, No acute distress CV:  Regular Rate & Rhythm, no murmur Respiratory:  Normal work of breathing, CTAB Abd:  + BS, soft, no tenderness to palpation Ext: no pre-tibial edema Knee: no effusion Neuro: patellar reflex 1+ bilaterally.  No clonus       Assessment & Plan:

## 2012-04-28 NOTE — Patient Instructions (Addendum)
I am glad you're doing well  If you feel like your knee flares or or gets more painful and want to try a knee injection, feel free to schedule appointment and we can see if a knee injection will help you stay more active

## 2012-04-28 NOTE — Assessment & Plan Note (Signed)
Some intermittent retention.  Have minimized offending drugs.  Constipation may be playing a role, has upcoming appt with urology for further evaluation.

## 2012-04-28 NOTE — Assessment & Plan Note (Signed)
Filled out form for diabetic supplies.  Discussed with daughter ok to test only once weekly.  Will do this for the next 3 months, if a1c is above goal, may increase checking as daughter feels it helps her mother stay honest with her diet.  a1c 6.0 currently- diet controlled.

## 2012-04-28 NOTE — Assessment & Plan Note (Signed)
Improved with physical therapy and now in assisted living.

## 2012-05-01 ENCOUNTER — Telehealth: Payer: Self-pay | Admitting: Family Medicine

## 2012-05-01 MED ORDER — CETIRIZINE HCL 10 MG PO TABS: 10.0000 mg | ORAL_TABLET | Freq: Every day | ORAL | Status: DC

## 2012-05-01 MED ORDER — DEXTROMETHORPHAN POLISTIREX 30 MG/5ML PO LQCR: 30.0000 mg | Freq: Four times a day (QID) | ORAL | Status: DC | PRN

## 2012-05-01 NOTE — Telephone Encounter (Signed)
Will forward to Dr. Briscoe. 

## 2012-05-01 NOTE — Telephone Encounter (Signed)
Will print out orders to fax.

## 2012-05-01 NOTE — Telephone Encounter (Signed)
Pt has been given Xyzal (from NH) for her allergies and it is drying her out too much - keeping her up at night with dry mouth and coughing - wants Dr Earnest Bailey to let NH know to take her off of this med and put her back on the allergy med that she was on previously.  Also wants to give her Delsym at night for her cough since it is so mild.  pls advise

## 2012-05-01 NOTE — Telephone Encounter (Signed)
Fax to [facility] [phone]

## 2012-05-02 ENCOUNTER — Encounter: Payer: Self-pay | Admitting: Family Medicine

## 2012-05-02 ENCOUNTER — Ambulatory Visit (INDEPENDENT_AMBULATORY_CARE_PROVIDER_SITE_OTHER): Payer: Medicare Other | Admitting: Family Medicine

## 2012-05-02 VITALS — BP 162/78 | HR 80 | Temp 97.8°F | Wt 183.9 lb

## 2012-05-02 DIAGNOSIS — R300 Dysuria: Secondary | ICD-10-CM | POA: Diagnosis not present

## 2012-05-02 DIAGNOSIS — N390 Urinary tract infection, site not specified: Secondary | ICD-10-CM | POA: Diagnosis not present

## 2012-05-02 DIAGNOSIS — R059 Cough, unspecified: Secondary | ICD-10-CM | POA: Diagnosis not present

## 2012-05-02 DIAGNOSIS — R05 Cough: Secondary | ICD-10-CM | POA: Diagnosis not present

## 2012-05-02 LAB — POCT URINALYSIS DIPSTICK
Ketones, UA: NEGATIVE
Protein, UA: 100
Spec Grav, UA: 1.02

## 2012-05-02 LAB — POCT UA - MICROSCOPIC ONLY

## 2012-05-02 MED ORDER — CIPROFLOXACIN HCL 500 MG PO TABS: 500.0000 mg | ORAL_TABLET | Freq: Two times a day (BID) | ORAL | Status: DC

## 2012-05-02 MED ORDER — IPRATROPIUM BROMIDE 0.02 % IN SOLN
0.5000 mg | Freq: Once | RESPIRATORY_TRACT | Status: AC
  Administered 2012-05-02: 0.5 mg via RESPIRATORY_TRACT

## 2012-05-02 MED ORDER — ALBUTEROL SULFATE (2.5 MG/3ML) 0.083% IN NEBU
2.5000 mg | INHALATION_SOLUTION | Freq: Once | RESPIRATORY_TRACT | Status: AC
Start: 2012-05-02 — End: 2012-05-02
  Administered 2012-05-02: 2.5 mg via RESPIRATORY_TRACT

## 2012-05-02 NOTE — Patient Instructions (Signed)
Albuterol taper as described on prescription paper for next several days.   It does appear that she has a UTI.  I would recommend following up with urology as you planned. Take the Cipro for the next several days.   I will call if the urine culture results show that we need to change the antibiotic.

## 2012-05-02 NOTE — Assessment & Plan Note (Addendum)
Nebulizer treatment here in clinic. Pulm exam much improved after this.   Will write order for scheduled Albuterol usage/taper for next several days - provided on separate prescription paper that she needs for her assisted living.   No need for any further steroids or antibiotics at this time -- return if no improvement.

## 2012-05-02 NOTE — Progress Notes (Signed)
  Subjective:    Patient ID: Rebecca [name], female    DOB: 05/21/1941, 71 y.o.   MRN: [id number]  HPI  1.  Concern for UTI: Endorses history of hesitancy, urgency, incomplete voiding, dysuria for 3 days.  She has had incomplete voiding symptoms as well as recent urinary retention resolved with in and out catheter in ED recently. Also with suprapubic pressure.  No abdominal pain, nausea, vomiting, back pain, fevers, or chills.  No vaginal discharge, burning, or itching.  2.  Cough:  Persistent now for several weeks.  Worse at night.  Patient denies receiving her inhaler to use before going to bed at night.  States she has to ask for it during day as well and that she forgets to do this.  Has been treated as COPD exacerbation several times in past month with both steroids, nebulizer treatment in clinic, and antibiotics.  No fevers or chills.  Cough that is inconsistently productive of brown/green sputum.     Review of Systems See HPI above for review of systems.       Objective:   Physical Exam Gen:  Alert, cooperative patient who appears stated age in no acute distress.  Vital signs reviewed. HEENT:  [place]/AT. MMM Cardiac:  Regular rate and rhythm without murmur auscultated.  Good S1/S2. Pulm:  Diffuse wheezing in all lung fields, worse at bases.  No increased work of breathing, speaking in complete sentences. Abdomen:  Soft/ND/NT.  No CVA tenderness BL Ext:  No LE edema noted        Review of Systems     Objective:   Physical Exam        Assessment & Plan:

## 2012-05-02 NOTE — Assessment & Plan Note (Addendum)
Patient has had recurrent negative UA's based on record review.   However today she is with some blood, leuk esterase, and loaded WBCs. Concern is for treatment of colonization in patient who seems to have recurrent lower urinary tract issues. Plan to treat with Cipro and send for urine culture.  She has had recent instrumentation (urinary cath) and therefore there is concern of course for Enterococcus.  Will make appropriate changes based on culture results.   She already has urology appointment scheduled for next Tuesday, encouraged to keep this.

## 2012-05-04 LAB — URINE CULTURE

## 2012-05-09 DIAGNOSIS — R339 Retention of urine, unspecified: Secondary | ICD-10-CM | POA: Diagnosis not present

## 2012-05-09 DIAGNOSIS — N390 Urinary tract infection, site not specified: Secondary | ICD-10-CM | POA: Diagnosis not present

## 2012-05-16 ENCOUNTER — Ambulatory Visit: Payer: Self-pay

## 2012-05-18 DIAGNOSIS — Z79899 Other long term (current) drug therapy: Secondary | ICD-10-CM | POA: Diagnosis not present

## 2012-05-29 ENCOUNTER — Other Ambulatory Visit: Payer: Self-pay | Admitting: *Deleted

## 2012-05-29 DIAGNOSIS — F200 Paranoid schizophrenia: Secondary | ICD-10-CM | POA: Diagnosis not present

## 2012-05-29 MED ORDER — POLYETHYLENE GLYCOL 3350 17 GM/SCOOP PO POWD: 17.0000 g | Freq: Every evening | ORAL | Status: DC | PRN

## 2012-05-30 ENCOUNTER — Encounter: Payer: Self-pay | Admitting: Family Medicine

## 2012-05-30 ENCOUNTER — Ambulatory Visit (INDEPENDENT_AMBULATORY_CARE_PROVIDER_SITE_OTHER): Payer: Medicare Other | Admitting: Family Medicine

## 2012-05-30 VITALS — BP 156/65 | HR 70 | Temp 98.3°F | Ht 63.0 in | Wt 193.0 lb

## 2012-05-30 DIAGNOSIS — R3989 Other symptoms and signs involving the genitourinary system: Secondary | ICD-10-CM | POA: Diagnosis not present

## 2012-05-30 DIAGNOSIS — R058 Other specified cough: Secondary | ICD-10-CM

## 2012-05-30 DIAGNOSIS — F259 Schizoaffective disorder, unspecified: Secondary | ICD-10-CM

## 2012-05-30 DIAGNOSIS — I10 Essential (primary) hypertension: Secondary | ICD-10-CM

## 2012-05-30 DIAGNOSIS — R399 Unspecified symptoms and signs involving the genitourinary system: Secondary | ICD-10-CM

## 2012-05-30 DIAGNOSIS — K5909 Other constipation: Secondary | ICD-10-CM

## 2012-05-30 DIAGNOSIS — R059 Cough, unspecified: Secondary | ICD-10-CM | POA: Diagnosis not present

## 2012-05-30 DIAGNOSIS — N390 Urinary tract infection, site not specified: Secondary | ICD-10-CM | POA: Diagnosis not present

## 2012-05-30 DIAGNOSIS — IMO0002 Reserved for concepts with insufficient information to code with codable children: Secondary | ICD-10-CM

## 2012-05-30 DIAGNOSIS — R300 Dysuria: Secondary | ICD-10-CM

## 2012-05-30 DIAGNOSIS — L299 Pruritus, unspecified: Secondary | ICD-10-CM

## 2012-05-30 DIAGNOSIS — R05 Cough: Secondary | ICD-10-CM

## 2012-05-30 DIAGNOSIS — E1165 Type 2 diabetes mellitus with hyperglycemia: Secondary | ICD-10-CM

## 2012-05-30 DIAGNOSIS — E118 Type 2 diabetes mellitus with unspecified complications: Secondary | ICD-10-CM

## 2012-05-30 LAB — POCT URINALYSIS DIPSTICK
Ketones, UA: NEGATIVE
Protein, UA: 100
Spec Grav, UA: 1.015
pH, UA: 6

## 2012-05-30 LAB — POCT UA - MICROSCOPIC ONLY

## 2012-05-30 MED ORDER — SULFAMETHOXAZOLE-TRIMETHOPRIM 800-160 MG PO TABS: 1.0000 | ORAL_TABLET | Freq: Two times a day (BID) | ORAL | Status: DC

## 2012-05-30 MED ORDER — SENNOSIDES 8.6 MG PO TABS: 2.0000 | ORAL_TABLET | Freq: Every day | ORAL | Status: DC

## 2012-05-30 MED ORDER — PANTOPRAZOLE SODIUM 20 MG PO TBEC: 40.0000 mg | DELAYED_RELEASE_TABLET | Freq: Every day | ORAL | Status: DC

## 2012-05-30 NOTE — Patient Instructions (Addendum)
Start bactrim for urinary tract infection- twice a day for 5 days  Will add senokot as needed for constipation  Protonix now 20 mg instead of 40 mg  Try to only take xyzal as needed, not every day   Will add TSH to next blood draw  Check blood pressures once daily for 1 week and fax to office  Change diet to low sodium, diabetic diet  Follow-up in 3 months

## 2012-05-30 NOTE — Progress Notes (Signed)
  Subjective:    Patient ID: Rebecca [name], female    DOB: 1940/12/04, 71 y.o.   MRN: [id number]  HPI  Here for follow-up.  Still living at [facility].  Gait instability:  Reports improvement in gait with PT.  NO recent falls.  Patient has a continued need for physical therapy to prevent future falls.  Was enrolled in Home Health d=ue to inability to drive, gait instability and dyspnea was making it difficult for her  To travel regularly.  Itching:  Will make zyxal prn as clinically, not sure it is making a difference.  Dropping hydroxyzine did not make itching worse.  Mental health:  Changing back to [place], family requests medical info be sent to his office  COPD:  Was seen a few weeks ago for COPD exacerbation.  Improved but some mild dyspnea and clear sputum ( no longer thick and colored).  Not using albuterol any longer.  Dysuria:  Reports new onset dysuria, no fever or abdominal pain.  Feels abdomen is somewhat distended.  No BM in one day.    I have reviewed patient's  PMH, FH, and Social history and Medications as related to this visit.  Review of Systems    see HPI Objective:   Physical Exam GEN: Alert & Oriented, No acute distress CV:  Regular Rate & Rhythm, no murmur Respiratory:  Normal work of breathing, CTAB Abd:  + BS, soft, no tenderness to palpation Ext: no pre-tibial edema        Assessment & Plan:

## 2012-05-31 NOTE — Assessment & Plan Note (Signed)
On miralax bid and colace bid.  Will ad senna prn.  If still cotninues to have trouble with constipation, would consider lubiprostone

## 2012-05-31 NOTE — Assessment & Plan Note (Signed)
Elevated today and occasional previous visit.  In past has had more trouble with hypotension.  Will ask nursing facility to check blood pressures and send to office.

## 2012-05-31 NOTE — Assessment & Plan Note (Signed)
Wrote order for diabetic, low sodium diet for facilty

## 2012-05-31 NOTE — Assessment & Plan Note (Signed)
intermittent urinary retention thought to be due to constipation per urology.  Cystoscopy pending.  This is a reasonable diagnosis for this patient

## 2012-05-31 NOTE — Assessment & Plan Note (Addendum)
Will treat for UTI today with bactrim, culture pending

## 2012-05-31 NOTE — Assessment & Plan Note (Signed)
Cough improving, will continue to monitor for resolution of symptoms at this time

## 2012-05-31 NOTE — Assessment & Plan Note (Signed)
In attempt to decrease polypharmacy, will trial off of xyzal

## 2012-06-01 DIAGNOSIS — M545 Low back pain: Secondary | ICD-10-CM | POA: Diagnosis not present

## 2012-06-01 DIAGNOSIS — R279 Unspecified lack of coordination: Secondary | ICD-10-CM | POA: Diagnosis not present

## 2012-06-01 DIAGNOSIS — J449 Chronic obstructive pulmonary disease, unspecified: Secondary | ICD-10-CM | POA: Diagnosis not present

## 2012-06-01 DIAGNOSIS — R262 Difficulty in walking, not elsewhere classified: Secondary | ICD-10-CM | POA: Diagnosis not present

## 2012-06-01 DIAGNOSIS — M2550 Pain in unspecified joint: Secondary | ICD-10-CM | POA: Diagnosis not present

## 2012-06-03 LAB — URINE CULTURE: Colony Count: >=100000

## 2012-06-05 ENCOUNTER — Other Ambulatory Visit: Payer: Self-pay | Admitting: Family Medicine

## 2012-06-05 DIAGNOSIS — M545 Low back pain: Secondary | ICD-10-CM | POA: Diagnosis not present

## 2012-06-05 DIAGNOSIS — M2550 Pain in unspecified joint: Secondary | ICD-10-CM | POA: Diagnosis not present

## 2012-06-05 DIAGNOSIS — R262 Difficulty in walking, not elsewhere classified: Secondary | ICD-10-CM | POA: Diagnosis not present

## 2012-06-05 DIAGNOSIS — J449 Chronic obstructive pulmonary disease, unspecified: Secondary | ICD-10-CM | POA: Diagnosis not present

## 2012-06-05 DIAGNOSIS — R279 Unspecified lack of coordination: Secondary | ICD-10-CM | POA: Diagnosis not present

## 2012-06-05 MED ORDER — ASPIRIN 81 MG PO TBEC: 81.0000 mg | DELAYED_RELEASE_TABLET | Freq: Every day | ORAL | Status: DC

## 2012-06-05 MED ORDER — CALCIUM CARBONATE-VITAMIN D 600-400 MG-UNIT PO TABS: 1.0000 | ORAL_TABLET | Freq: Every day | ORAL | Status: DC

## 2012-06-08 DIAGNOSIS — M545 Low back pain: Secondary | ICD-10-CM | POA: Diagnosis not present

## 2012-06-08 DIAGNOSIS — J449 Chronic obstructive pulmonary disease, unspecified: Secondary | ICD-10-CM | POA: Diagnosis not present

## 2012-06-08 DIAGNOSIS — R262 Difficulty in walking, not elsewhere classified: Secondary | ICD-10-CM | POA: Diagnosis not present

## 2012-06-08 DIAGNOSIS — M2550 Pain in unspecified joint: Secondary | ICD-10-CM | POA: Diagnosis not present

## 2012-06-08 DIAGNOSIS — R279 Unspecified lack of coordination: Secondary | ICD-10-CM | POA: Diagnosis not present

## 2012-06-09 DIAGNOSIS — J449 Chronic obstructive pulmonary disease, unspecified: Secondary | ICD-10-CM | POA: Diagnosis not present

## 2012-06-09 DIAGNOSIS — R262 Difficulty in walking, not elsewhere classified: Secondary | ICD-10-CM | POA: Diagnosis not present

## 2012-06-09 DIAGNOSIS — M2550 Pain in unspecified joint: Secondary | ICD-10-CM | POA: Diagnosis not present

## 2012-06-09 DIAGNOSIS — M545 Low back pain: Secondary | ICD-10-CM | POA: Diagnosis not present

## 2012-06-09 DIAGNOSIS — R279 Unspecified lack of coordination: Secondary | ICD-10-CM | POA: Diagnosis not present

## 2012-06-12 DIAGNOSIS — M545 Low back pain: Secondary | ICD-10-CM | POA: Diagnosis not present

## 2012-06-12 DIAGNOSIS — R262 Difficulty in walking, not elsewhere classified: Secondary | ICD-10-CM | POA: Diagnosis not present

## 2012-06-12 DIAGNOSIS — M2550 Pain in unspecified joint: Secondary | ICD-10-CM | POA: Diagnosis not present

## 2012-06-12 DIAGNOSIS — R279 Unspecified lack of coordination: Secondary | ICD-10-CM | POA: Diagnosis not present

## 2012-06-12 DIAGNOSIS — J449 Chronic obstructive pulmonary disease, unspecified: Secondary | ICD-10-CM | POA: Diagnosis not present

## 2012-06-13 DIAGNOSIS — J449 Chronic obstructive pulmonary disease, unspecified: Secondary | ICD-10-CM | POA: Diagnosis not present

## 2012-06-13 DIAGNOSIS — R279 Unspecified lack of coordination: Secondary | ICD-10-CM | POA: Diagnosis not present

## 2012-06-13 DIAGNOSIS — R262 Difficulty in walking, not elsewhere classified: Secondary | ICD-10-CM | POA: Diagnosis not present

## 2012-06-13 DIAGNOSIS — M545 Low back pain: Secondary | ICD-10-CM | POA: Diagnosis not present

## 2012-06-13 DIAGNOSIS — M2550 Pain in unspecified joint: Secondary | ICD-10-CM | POA: Diagnosis not present

## 2012-06-15 ENCOUNTER — Telehealth: Payer: Self-pay | Admitting: Family Medicine

## 2012-06-15 NOTE — Telephone Encounter (Signed)
Patient was told by someone at her snf that she needed a fluid pill because she is puffy.  Her daughter is calling asking if Dr. Earnest Bailey will prescribe something.  The nurse is Theda Belfast.

## 2012-06-16 ENCOUNTER — Encounter: Payer: Self-pay | Admitting: Family Medicine

## 2012-06-16 DIAGNOSIS — R1311 Dysphagia, oral phase: Secondary | ICD-10-CM | POA: Diagnosis not present

## 2012-06-16 DIAGNOSIS — M545 Low back pain: Secondary | ICD-10-CM | POA: Diagnosis not present

## 2012-06-16 DIAGNOSIS — R262 Difficulty in walking, not elsewhere classified: Secondary | ICD-10-CM | POA: Diagnosis not present

## 2012-06-16 DIAGNOSIS — J449 Chronic obstructive pulmonary disease, unspecified: Secondary | ICD-10-CM | POA: Diagnosis not present

## 2012-06-16 DIAGNOSIS — Z9181 History of falling: Secondary | ICD-10-CM | POA: Diagnosis not present

## 2012-06-16 DIAGNOSIS — M6281 Muscle weakness (generalized): Secondary | ICD-10-CM | POA: Diagnosis not present

## 2012-06-16 DIAGNOSIS — M009 Pyogenic arthritis, unspecified: Secondary | ICD-10-CM | POA: Diagnosis not present

## 2012-06-16 DIAGNOSIS — R488 Other symbolic dysfunctions: Secondary | ICD-10-CM | POA: Diagnosis not present

## 2012-06-16 DIAGNOSIS — M2550 Pain in unspecified joint: Secondary | ICD-10-CM | POA: Diagnosis not present

## 2012-06-16 DIAGNOSIS — D649 Anemia, unspecified: Secondary | ICD-10-CM | POA: Diagnosis not present

## 2012-06-16 DIAGNOSIS — K219 Gastro-esophageal reflux disease without esophagitis: Secondary | ICD-10-CM | POA: Diagnosis not present

## 2012-06-16 DIAGNOSIS — R279 Unspecified lack of coordination: Secondary | ICD-10-CM | POA: Diagnosis not present

## 2012-06-16 LAB — HEPATIC FUNCTION PANEL
ALT: 25 U/L (ref 7–35)
AST: 16 U/L (ref 13–35)
Alkaline Phosphatase: 30 U/L (ref 25–125)

## 2012-06-16 LAB — BASIC METABOLIC PANEL
Potassium: 5.9 mmol/L — AB (ref 3.4–5.3)
Sodium: 135 mmol/L — AB (ref 137–147)

## 2012-06-16 NOTE — Telephone Encounter (Signed)
Nurse is calling to find out if there is going to be an addition to the labs that are being drawn.  She said she can take a verbal order.

## 2012-06-19 DIAGNOSIS — J449 Chronic obstructive pulmonary disease, unspecified: Secondary | ICD-10-CM | POA: Diagnosis not present

## 2012-06-19 DIAGNOSIS — R300 Dysuria: Secondary | ICD-10-CM | POA: Diagnosis not present

## 2012-06-19 DIAGNOSIS — R262 Difficulty in walking, not elsewhere classified: Secondary | ICD-10-CM | POA: Diagnosis not present

## 2012-06-19 DIAGNOSIS — K219 Gastro-esophageal reflux disease without esophagitis: Secondary | ICD-10-CM | POA: Diagnosis not present

## 2012-06-19 DIAGNOSIS — M545 Low back pain: Secondary | ICD-10-CM | POA: Diagnosis not present

## 2012-06-19 DIAGNOSIS — M009 Pyogenic arthritis, unspecified: Secondary | ICD-10-CM | POA: Diagnosis not present

## 2012-06-19 NOTE — Telephone Encounter (Signed)
Claris Gower, nurse seeing pt. States she will advise dgt  Of Dr Doristine Counter rec- no fluid pills and to elevate legs and use compression stockings.

## 2012-06-19 NOTE — Telephone Encounter (Signed)
Fluid pill is not safe for patient.  I advise compression stockings, feet elevation.  If worsens, or has other symptoms, needs to be seen in office.

## 2012-06-20 DIAGNOSIS — M009 Pyogenic arthritis, unspecified: Secondary | ICD-10-CM | POA: Diagnosis not present

## 2012-06-20 DIAGNOSIS — R262 Difficulty in walking, not elsewhere classified: Secondary | ICD-10-CM | POA: Diagnosis not present

## 2012-06-20 DIAGNOSIS — E782 Mixed hyperlipidemia: Secondary | ICD-10-CM | POA: Diagnosis not present

## 2012-06-20 DIAGNOSIS — D500 Iron deficiency anemia secondary to blood loss (chronic): Secondary | ICD-10-CM | POA: Diagnosis not present

## 2012-06-20 DIAGNOSIS — J449 Chronic obstructive pulmonary disease, unspecified: Secondary | ICD-10-CM | POA: Diagnosis not present

## 2012-06-20 DIAGNOSIS — I2510 Atherosclerotic heart disease of native coronary artery without angina pectoris: Secondary | ICD-10-CM | POA: Diagnosis not present

## 2012-06-20 DIAGNOSIS — M545 Low back pain: Secondary | ICD-10-CM | POA: Diagnosis not present

## 2012-06-20 DIAGNOSIS — K219 Gastro-esophageal reflux disease without esophagitis: Secondary | ICD-10-CM | POA: Diagnosis not present

## 2012-06-21 ENCOUNTER — Other Ambulatory Visit (HOSPITAL_COMMUNITY): Payer: Self-pay | Admitting: Cardiovascular Disease

## 2012-06-21 DIAGNOSIS — J449 Chronic obstructive pulmonary disease, unspecified: Secondary | ICD-10-CM | POA: Diagnosis not present

## 2012-06-21 DIAGNOSIS — R5381 Other malaise: Secondary | ICD-10-CM | POA: Diagnosis not present

## 2012-06-21 DIAGNOSIS — M545 Low back pain: Secondary | ICD-10-CM | POA: Diagnosis not present

## 2012-06-21 DIAGNOSIS — M009 Pyogenic arthritis, unspecified: Secondary | ICD-10-CM | POA: Diagnosis not present

## 2012-06-21 DIAGNOSIS — E782 Mixed hyperlipidemia: Secondary | ICD-10-CM | POA: Diagnosis not present

## 2012-06-21 DIAGNOSIS — R0989 Other specified symptoms and signs involving the circulatory and respiratory systems: Secondary | ICD-10-CM

## 2012-06-21 DIAGNOSIS — R6889 Other general symptoms and signs: Secondary | ICD-10-CM | POA: Diagnosis not present

## 2012-06-21 DIAGNOSIS — R262 Difficulty in walking, not elsewhere classified: Secondary | ICD-10-CM | POA: Diagnosis not present

## 2012-06-21 DIAGNOSIS — K219 Gastro-esophageal reflux disease without esophagitis: Secondary | ICD-10-CM | POA: Diagnosis not present

## 2012-06-21 LAB — HEPATIC FUNCTION PANEL
AST: 17 U/L (ref 13–35)
Bilirubin, Total: 0.5 mg/dL

## 2012-06-21 LAB — TSH: TSH: 1.26 u[IU]/mL (ref 0.41–5.90)

## 2012-06-21 LAB — BASIC METABOLIC PANEL
BUN: 33 mg/dL — AB (ref 4–21)
Creatinine: 1.6 mg/dL — AB (ref 0.5–1.1)
Sodium: 137 mmol/L (ref 137–147)

## 2012-06-21 LAB — LIPID PANEL: Triglycerides: 87 mg/dL (ref 40–160)

## 2012-06-22 ENCOUNTER — Encounter: Payer: Self-pay | Admitting: Family Medicine

## 2012-06-22 ENCOUNTER — Other Ambulatory Visit: Payer: Self-pay | Admitting: Family Medicine

## 2012-06-22 DIAGNOSIS — J449 Chronic obstructive pulmonary disease, unspecified: Secondary | ICD-10-CM | POA: Diagnosis not present

## 2012-06-22 DIAGNOSIS — R262 Difficulty in walking, not elsewhere classified: Secondary | ICD-10-CM | POA: Diagnosis not present

## 2012-06-22 DIAGNOSIS — M009 Pyogenic arthritis, unspecified: Secondary | ICD-10-CM | POA: Diagnosis not present

## 2012-06-22 DIAGNOSIS — K219 Gastro-esophageal reflux disease without esophagitis: Secondary | ICD-10-CM | POA: Diagnosis not present

## 2012-06-22 DIAGNOSIS — M545 Low back pain: Secondary | ICD-10-CM | POA: Diagnosis not present

## 2012-06-23 DIAGNOSIS — J449 Chronic obstructive pulmonary disease, unspecified: Secondary | ICD-10-CM | POA: Diagnosis not present

## 2012-06-23 DIAGNOSIS — R262 Difficulty in walking, not elsewhere classified: Secondary | ICD-10-CM | POA: Diagnosis not present

## 2012-06-23 DIAGNOSIS — M545 Low back pain: Secondary | ICD-10-CM | POA: Diagnosis not present

## 2012-06-23 DIAGNOSIS — K219 Gastro-esophageal reflux disease without esophagitis: Secondary | ICD-10-CM | POA: Diagnosis not present

## 2012-06-23 DIAGNOSIS — M009 Pyogenic arthritis, unspecified: Secondary | ICD-10-CM | POA: Diagnosis not present

## 2012-06-26 ENCOUNTER — Ambulatory Visit: Payer: Self-pay | Admitting: Family Medicine

## 2012-06-26 DIAGNOSIS — M009 Pyogenic arthritis, unspecified: Secondary | ICD-10-CM | POA: Diagnosis not present

## 2012-06-26 DIAGNOSIS — K219 Gastro-esophageal reflux disease without esophagitis: Secondary | ICD-10-CM | POA: Diagnosis not present

## 2012-06-26 DIAGNOSIS — J449 Chronic obstructive pulmonary disease, unspecified: Secondary | ICD-10-CM | POA: Diagnosis not present

## 2012-06-26 DIAGNOSIS — R262 Difficulty in walking, not elsewhere classified: Secondary | ICD-10-CM | POA: Diagnosis not present

## 2012-06-26 DIAGNOSIS — M545 Low back pain: Secondary | ICD-10-CM | POA: Diagnosis not present

## 2012-06-27 ENCOUNTER — Encounter: Payer: Self-pay | Admitting: Family Medicine

## 2012-06-27 DIAGNOSIS — E782 Mixed hyperlipidemia: Secondary | ICD-10-CM | POA: Diagnosis not present

## 2012-06-27 DIAGNOSIS — M545 Low back pain: Secondary | ICD-10-CM | POA: Diagnosis not present

## 2012-06-27 DIAGNOSIS — J449 Chronic obstructive pulmonary disease, unspecified: Secondary | ICD-10-CM | POA: Diagnosis not present

## 2012-06-27 DIAGNOSIS — M009 Pyogenic arthritis, unspecified: Secondary | ICD-10-CM | POA: Diagnosis not present

## 2012-06-27 DIAGNOSIS — K219 Gastro-esophageal reflux disease without esophagitis: Secondary | ICD-10-CM | POA: Diagnosis not present

## 2012-06-27 DIAGNOSIS — E039 Hypothyroidism, unspecified: Secondary | ICD-10-CM | POA: Diagnosis not present

## 2012-06-27 DIAGNOSIS — I2510 Atherosclerotic heart disease of native coronary artery without angina pectoris: Secondary | ICD-10-CM | POA: Diagnosis not present

## 2012-06-27 DIAGNOSIS — R262 Difficulty in walking, not elsewhere classified: Secondary | ICD-10-CM | POA: Diagnosis not present

## 2012-06-28 DIAGNOSIS — J449 Chronic obstructive pulmonary disease, unspecified: Secondary | ICD-10-CM | POA: Diagnosis not present

## 2012-06-28 DIAGNOSIS — M545 Low back pain: Secondary | ICD-10-CM | POA: Diagnosis not present

## 2012-06-28 DIAGNOSIS — R262 Difficulty in walking, not elsewhere classified: Secondary | ICD-10-CM | POA: Diagnosis not present

## 2012-06-28 DIAGNOSIS — K219 Gastro-esophageal reflux disease without esophagitis: Secondary | ICD-10-CM | POA: Diagnosis not present

## 2012-06-28 DIAGNOSIS — M009 Pyogenic arthritis, unspecified: Secondary | ICD-10-CM | POA: Diagnosis not present

## 2012-06-29 DIAGNOSIS — R262 Difficulty in walking, not elsewhere classified: Secondary | ICD-10-CM | POA: Diagnosis not present

## 2012-06-29 DIAGNOSIS — K219 Gastro-esophageal reflux disease without esophagitis: Secondary | ICD-10-CM | POA: Diagnosis not present

## 2012-06-29 DIAGNOSIS — J449 Chronic obstructive pulmonary disease, unspecified: Secondary | ICD-10-CM | POA: Diagnosis not present

## 2012-06-29 DIAGNOSIS — M545 Low back pain: Secondary | ICD-10-CM | POA: Diagnosis not present

## 2012-06-29 DIAGNOSIS — M009 Pyogenic arthritis, unspecified: Secondary | ICD-10-CM | POA: Diagnosis not present

## 2012-06-30 DIAGNOSIS — M009 Pyogenic arthritis, unspecified: Secondary | ICD-10-CM | POA: Diagnosis not present

## 2012-06-30 DIAGNOSIS — F200 Paranoid schizophrenia: Secondary | ICD-10-CM | POA: Diagnosis not present

## 2012-06-30 DIAGNOSIS — K219 Gastro-esophageal reflux disease without esophagitis: Secondary | ICD-10-CM | POA: Diagnosis not present

## 2012-06-30 DIAGNOSIS — R262 Difficulty in walking, not elsewhere classified: Secondary | ICD-10-CM | POA: Diagnosis not present

## 2012-06-30 DIAGNOSIS — J449 Chronic obstructive pulmonary disease, unspecified: Secondary | ICD-10-CM | POA: Diagnosis not present

## 2012-06-30 DIAGNOSIS — M545 Low back pain: Secondary | ICD-10-CM | POA: Diagnosis not present

## 2012-07-03 ENCOUNTER — Ambulatory Visit (HOSPITAL_COMMUNITY)
Admission: RE | Admit: 2012-07-03 | Discharge: 2012-07-03 | Disposition: A | Discharge status: Home / Self Care | Payer: Medicare Other | Source: Ambulatory Visit | Attending: Cardiovascular Disease | Admitting: Cardiovascular Disease

## 2012-07-03 DIAGNOSIS — J449 Chronic obstructive pulmonary disease, unspecified: Secondary | ICD-10-CM | POA: Diagnosis not present

## 2012-07-03 DIAGNOSIS — R262 Difficulty in walking, not elsewhere classified: Secondary | ICD-10-CM | POA: Diagnosis not present

## 2012-07-03 DIAGNOSIS — M545 Low back pain: Secondary | ICD-10-CM | POA: Diagnosis not present

## 2012-07-03 DIAGNOSIS — R0989 Other specified symptoms and signs involving the circulatory and respiratory systems: Secondary | ICD-10-CM | POA: Diagnosis not present

## 2012-07-03 DIAGNOSIS — M009 Pyogenic arthritis, unspecified: Secondary | ICD-10-CM | POA: Diagnosis not present

## 2012-07-03 DIAGNOSIS — K219 Gastro-esophageal reflux disease without esophagitis: Secondary | ICD-10-CM | POA: Diagnosis not present

## 2012-07-03 NOTE — Progress Notes (Signed)
Carotid duplex completed.   07/03/2012  Gertie Fey, RDMS, RDCS

## 2012-07-04 DIAGNOSIS — K219 Gastro-esophageal reflux disease without esophagitis: Secondary | ICD-10-CM | POA: Diagnosis not present

## 2012-07-04 DIAGNOSIS — J449 Chronic obstructive pulmonary disease, unspecified: Secondary | ICD-10-CM | POA: Diagnosis not present

## 2012-07-04 DIAGNOSIS — M009 Pyogenic arthritis, unspecified: Secondary | ICD-10-CM | POA: Diagnosis not present

## 2012-07-04 DIAGNOSIS — R262 Difficulty in walking, not elsewhere classified: Secondary | ICD-10-CM | POA: Diagnosis not present

## 2012-07-04 DIAGNOSIS — M545 Low back pain: Secondary | ICD-10-CM | POA: Diagnosis not present

## 2012-07-05 ENCOUNTER — Telehealth: Payer: Self-pay | Admitting: Family Medicine

## 2012-07-05 DIAGNOSIS — K219 Gastro-esophageal reflux disease without esophagitis: Secondary | ICD-10-CM | POA: Diagnosis not present

## 2012-07-05 DIAGNOSIS — M009 Pyogenic arthritis, unspecified: Secondary | ICD-10-CM | POA: Diagnosis not present

## 2012-07-05 DIAGNOSIS — R262 Difficulty in walking, not elsewhere classified: Secondary | ICD-10-CM | POA: Diagnosis not present

## 2012-07-05 DIAGNOSIS — J449 Chronic obstructive pulmonary disease, unspecified: Secondary | ICD-10-CM | POA: Diagnosis not present

## 2012-07-05 DIAGNOSIS — M545 Low back pain: Secondary | ICD-10-CM | POA: Diagnosis not present

## 2012-07-05 NOTE — Telephone Encounter (Signed)
Is asking to speak with Dr Earnest Bailey about her mom's pharmacy - she is worried that they are putting Dr Briscoe's name on meds that she does not need to be taking.  Would like to discuss this with Dr Earnest Bailey

## 2012-07-05 NOTE — Telephone Encounter (Signed)
Spoke with patients dgt.  She states that the pharmacy @ [facility] have been giving her mom 50mg  prednisone everyday.  She was gaining wt and was having palpitations.  Since she was having palpitations she took her to her cardiologist who is weaning her off the prednisone by decreasing the prednisone 10mg  every 3 days.  She states this is the 3rd time that she has had issues with this pharmacy and she is extreamly concerned.  She is more concerned by the fact that they were giving her the prednisone and per [name] the prescribing MD was Dr. Earnest Bailey and she knows Earnest Bailey was NOT prescribing this.  She is going to have an meeting with the Care Team and wants to speak with Dr. Earnest Bailey before she does this.  She is considering taking her out of [place]. Will forward to MD. Milas Gain, [name]

## 2012-07-06 DIAGNOSIS — K219 Gastro-esophageal reflux disease without esophagitis: Secondary | ICD-10-CM | POA: Diagnosis not present

## 2012-07-06 DIAGNOSIS — M009 Pyogenic arthritis, unspecified: Secondary | ICD-10-CM | POA: Diagnosis not present

## 2012-07-06 DIAGNOSIS — J449 Chronic obstructive pulmonary disease, unspecified: Secondary | ICD-10-CM | POA: Diagnosis not present

## 2012-07-06 DIAGNOSIS — M545 Low back pain: Secondary | ICD-10-CM | POA: Diagnosis not present

## 2012-07-06 DIAGNOSIS — R262 Difficulty in walking, not elsewhere classified: Secondary | ICD-10-CM | POA: Diagnosis not present

## 2012-07-06 NOTE — Telephone Encounter (Signed)
Spoke with daughter regarding her concern over poor communication.  One incident was regarding them extending a prescription for prednisone which she got from the ER. She is now on a taper prescribed by her cardiologist.  Rebecca [name] for letting me know of concerns.

## 2012-07-07 DIAGNOSIS — M545 Low back pain: Secondary | ICD-10-CM | POA: Diagnosis not present

## 2012-07-07 DIAGNOSIS — R262 Difficulty in walking, not elsewhere classified: Secondary | ICD-10-CM | POA: Diagnosis not present

## 2012-07-07 DIAGNOSIS — K219 Gastro-esophageal reflux disease without esophagitis: Secondary | ICD-10-CM | POA: Diagnosis not present

## 2012-07-07 DIAGNOSIS — J449 Chronic obstructive pulmonary disease, unspecified: Secondary | ICD-10-CM | POA: Diagnosis not present

## 2012-07-07 DIAGNOSIS — M009 Pyogenic arthritis, unspecified: Secondary | ICD-10-CM | POA: Diagnosis not present

## 2012-07-10 DIAGNOSIS — M545 Low back pain: Secondary | ICD-10-CM | POA: Diagnosis not present

## 2012-07-10 DIAGNOSIS — J449 Chronic obstructive pulmonary disease, unspecified: Secondary | ICD-10-CM | POA: Diagnosis not present

## 2012-07-10 DIAGNOSIS — M009 Pyogenic arthritis, unspecified: Secondary | ICD-10-CM | POA: Diagnosis not present

## 2012-07-10 DIAGNOSIS — R262 Difficulty in walking, not elsewhere classified: Secondary | ICD-10-CM | POA: Diagnosis not present

## 2012-07-10 DIAGNOSIS — K219 Gastro-esophageal reflux disease without esophagitis: Secondary | ICD-10-CM | POA: Diagnosis not present

## 2012-07-11 DIAGNOSIS — K219 Gastro-esophageal reflux disease without esophagitis: Secondary | ICD-10-CM | POA: Diagnosis not present

## 2012-07-11 DIAGNOSIS — M009 Pyogenic arthritis, unspecified: Secondary | ICD-10-CM | POA: Diagnosis not present

## 2012-07-11 DIAGNOSIS — J449 Chronic obstructive pulmonary disease, unspecified: Secondary | ICD-10-CM | POA: Diagnosis not present

## 2012-07-11 DIAGNOSIS — R262 Difficulty in walking, not elsewhere classified: Secondary | ICD-10-CM | POA: Diagnosis not present

## 2012-07-11 DIAGNOSIS — M545 Low back pain: Secondary | ICD-10-CM | POA: Diagnosis not present

## 2012-07-12 DIAGNOSIS — M545 Low back pain: Secondary | ICD-10-CM | POA: Diagnosis not present

## 2012-07-12 DIAGNOSIS — N390 Urinary tract infection, site not specified: Secondary | ICD-10-CM | POA: Diagnosis not present

## 2012-07-12 DIAGNOSIS — A499 Bacterial infection, unspecified: Secondary | ICD-10-CM | POA: Diagnosis not present

## 2012-07-12 DIAGNOSIS — J449 Chronic obstructive pulmonary disease, unspecified: Secondary | ICD-10-CM | POA: Diagnosis not present

## 2012-07-12 DIAGNOSIS — R262 Difficulty in walking, not elsewhere classified: Secondary | ICD-10-CM | POA: Diagnosis not present

## 2012-07-12 DIAGNOSIS — M009 Pyogenic arthritis, unspecified: Secondary | ICD-10-CM | POA: Diagnosis not present

## 2012-07-12 DIAGNOSIS — K219 Gastro-esophageal reflux disease without esophagitis: Secondary | ICD-10-CM | POA: Diagnosis not present

## 2012-07-13 ENCOUNTER — Inpatient Hospital Stay (HOSPITAL_COMMUNITY)
Admission: EM | Admit: 2012-07-13 | Discharge: 2012-07-15 | DRG: 191 | Disposition: A | Discharge status: Home Health | Payer: Medicare Other | Attending: Family Medicine | Admitting: Family Medicine

## 2012-07-13 ENCOUNTER — Encounter (HOSPITAL_COMMUNITY): Payer: Self-pay | Admitting: *Deleted

## 2012-07-13 ENCOUNTER — Emergency Department (HOSPITAL_COMMUNITY): Payer: Medicare Other

## 2012-07-13 ENCOUNTER — Other Ambulatory Visit: Payer: Self-pay

## 2012-07-13 DIAGNOSIS — J449 Chronic obstructive pulmonary disease, unspecified: Secondary | ICD-10-CM | POA: Diagnosis not present

## 2012-07-13 DIAGNOSIS — R5381 Other malaise: Secondary | ICD-10-CM | POA: Diagnosis not present

## 2012-07-13 DIAGNOSIS — R531 Weakness: Secondary | ICD-10-CM

## 2012-07-13 DIAGNOSIS — J209 Acute bronchitis, unspecified: Secondary | ICD-10-CM | POA: Diagnosis not present

## 2012-07-13 DIAGNOSIS — E669 Obesity, unspecified: Secondary | ICD-10-CM | POA: Diagnosis present

## 2012-07-13 DIAGNOSIS — E785 Hyperlipidemia, unspecified: Secondary | ICD-10-CM | POA: Diagnosis present

## 2012-07-13 DIAGNOSIS — Z87891 Personal history of nicotine dependence: Secondary | ICD-10-CM | POA: Diagnosis not present

## 2012-07-13 DIAGNOSIS — E118 Type 2 diabetes mellitus with unspecified complications: Secondary | ICD-10-CM | POA: Diagnosis present

## 2012-07-13 DIAGNOSIS — N184 Chronic kidney disease, stage 4 (severe): Secondary | ICD-10-CM | POA: Diagnosis present

## 2012-07-13 DIAGNOSIS — N179 Acute kidney failure, unspecified: Secondary | ICD-10-CM | POA: Diagnosis not present

## 2012-07-13 DIAGNOSIS — I129 Hypertensive chronic kidney disease with stage 1 through stage 4 chronic kidney disease, or unspecified chronic kidney disease: Secondary | ICD-10-CM | POA: Diagnosis present

## 2012-07-13 DIAGNOSIS — I5032 Chronic diastolic (congestive) heart failure: Secondary | ICD-10-CM | POA: Diagnosis present

## 2012-07-13 DIAGNOSIS — F259 Schizoaffective disorder, unspecified: Secondary | ICD-10-CM | POA: Diagnosis present

## 2012-07-13 DIAGNOSIS — M129 Arthropathy, unspecified: Secondary | ICD-10-CM | POA: Diagnosis present

## 2012-07-13 DIAGNOSIS — E039 Hypothyroidism, unspecified: Secondary | ICD-10-CM | POA: Diagnosis present

## 2012-07-13 DIAGNOSIS — M109 Gout, unspecified: Secondary | ICD-10-CM | POA: Diagnosis present

## 2012-07-13 DIAGNOSIS — K219 Gastro-esophageal reflux disease without esophagitis: Secondary | ICD-10-CM | POA: Diagnosis present

## 2012-07-13 DIAGNOSIS — D649 Anemia, unspecified: Secondary | ICD-10-CM | POA: Diagnosis present

## 2012-07-13 DIAGNOSIS — K5900 Constipation, unspecified: Secondary | ICD-10-CM | POA: Diagnosis present

## 2012-07-13 DIAGNOSIS — J441 Chronic obstructive pulmonary disease with (acute) exacerbation: Principal | ICD-10-CM | POA: Diagnosis present

## 2012-07-13 DIAGNOSIS — Z79899 Other long term (current) drug therapy: Secondary | ICD-10-CM | POA: Diagnosis not present

## 2012-07-13 DIAGNOSIS — D631 Anemia in chronic kidney disease: Secondary | ICD-10-CM | POA: Diagnosis present

## 2012-07-13 DIAGNOSIS — I509 Heart failure, unspecified: Secondary | ICD-10-CM | POA: Diagnosis present

## 2012-07-13 DIAGNOSIS — J40 Bronchitis, not specified as acute or chronic: Secondary | ICD-10-CM

## 2012-07-13 DIAGNOSIS — M47817 Spondylosis without myelopathy or radiculopathy, lumbosacral region: Secondary | ICD-10-CM | POA: Diagnosis present

## 2012-07-13 DIAGNOSIS — J438 Other emphysema: Secondary | ICD-10-CM | POA: Diagnosis not present

## 2012-07-13 DIAGNOSIS — M545 Low back pain, unspecified: Secondary | ICD-10-CM | POA: Diagnosis not present

## 2012-07-13 DIAGNOSIS — Z6834 Body mass index (BMI) 34.0-34.9, adult: Secondary | ICD-10-CM

## 2012-07-13 DIAGNOSIS — R262 Difficulty in walking, not elsewhere classified: Secondary | ICD-10-CM | POA: Diagnosis not present

## 2012-07-13 DIAGNOSIS — M009 Pyogenic arthritis, unspecified: Secondary | ICD-10-CM | POA: Diagnosis not present

## 2012-07-13 HISTORY — DX: Chronic obstructive pulmonary disease, unspecified: J44.9

## 2012-07-13 HISTORY — DX: Shortness of breath: R06.02

## 2012-07-13 HISTORY — DX: Obesity, unspecified: E66.9

## 2012-07-13 HISTORY — DX: Cerebral infarction, unspecified: I63.9

## 2012-07-13 LAB — CBC WITH DIFFERENTIAL/PLATELET
Basophils Absolute: 0 K/uL (ref 0.0–0.1)
Basophils Relative: 0 % (ref 0–1)
Eosinophils Absolute: 0 K/uL (ref 0.0–0.7)
Eosinophils Relative: 0 % (ref 0–5)
HCT: 28.8 % — ABNORMAL LOW (ref 36.0–46.0)
Hemoglobin: 9.9 g/dL — ABNORMAL LOW (ref 12.0–15.0)
Lymphocytes Relative: 16 % (ref 12–46)
Lymphs Abs: 1.6 K/uL (ref 0.7–4.0)
MCH: 30.6 pg (ref 26.0–34.0)
MCHC: 34.4 g/dL (ref 30.0–36.0)
MCV: 88.9 fL (ref 78.0–100.0)
Monocytes Absolute: 0.8 K/uL (ref 0.1–1.0)
Monocytes Relative: 8 % (ref 3–12)
Neutro Abs: 7.2 10*3/uL (ref 1.7–7.7)
Neutrophils Relative %: 75 % (ref 43–77)
Platelets: 147 10*3/uL — ABNORMAL LOW (ref 150–400)
RBC: 3.24 MIL/uL — ABNORMAL LOW (ref 3.87–5.11)
RDW: 14.9 % (ref 11.5–15.5)
WBC: 9.6 K/uL (ref 4.0–10.5)

## 2012-07-13 LAB — PRO B NATRIURETIC PEPTIDE: Pro B Natriuretic peptide (BNP): 493.1 pg/mL — ABNORMAL HIGH (ref 0–125)

## 2012-07-13 LAB — URINALYSIS, ROUTINE W REFLEX MICROSCOPIC
Bilirubin Urine: NEGATIVE
Glucose, UA: NEGATIVE mg/dL
Ketones, ur: NEGATIVE mg/dL
Nitrite: NEGATIVE
Protein, ur: 100 mg/dL — AB
Specific Gravity, Urine: 1.03 (ref 1.005–1.030)
Urobilinogen, UA: 0.2 mg/dL (ref 0.0–1.0)
pH: 6 (ref 5.0–8.0)

## 2012-07-13 LAB — COMPREHENSIVE METABOLIC PANEL
ALT: 25 U/L (ref 0–35)
AST: 18 U/L (ref 0–37)
Albumin: 3.3 g/dL — ABNORMAL LOW (ref 3.5–5.2)
Alkaline Phosphatase: 38 U/L — ABNORMAL LOW (ref 39–117)
BUN: 31 mg/dL — ABNORMAL HIGH (ref 6–23)
Chloride: 96 mEq/L (ref 96–112)
Potassium: 5.2 mEq/L — ABNORMAL HIGH (ref 3.5–5.1)
Sodium: 134 mEq/L — ABNORMAL LOW (ref 135–145)
Total Bilirubin: 0.3 mg/dL (ref 0.3–1.2)

## 2012-07-13 LAB — COMPREHENSIVE METABOLIC PANEL WITH GFR
CO2: 26 meq/L (ref 19–32)
Calcium: 9.1 mg/dL (ref 8.4–10.5)
Creatinine, Ser: 1.88 mg/dL — ABNORMAL HIGH (ref 0.50–1.10)
GFR calc Af Amer: 29 mL/min — ABNORMAL LOW (ref >90)
GFR calc non Af Amer: 25 mL/min — ABNORMAL LOW (ref >90)
Glucose, Bld: 189 mg/dL — ABNORMAL HIGH (ref 70–99)
Total Protein: 6.4 g/dL (ref 6.0–8.3)

## 2012-07-13 LAB — URINE MICROSCOPIC-ADD ON

## 2012-07-13 LAB — POCT I-STAT TROPONIN I: Troponin i, poc: 0.01 ng/mL (ref 0.00–0.08)

## 2012-07-13 MED ORDER — ALBUTEROL SULFATE (5 MG/ML) 0.5% IN NEBU
5.0000 mg | INHALATION_SOLUTION | Freq: Once | RESPIRATORY_TRACT | Status: AC
  Administered 2012-07-13: 5 mg via RESPIRATORY_TRACT
  Filled 2012-07-13: qty 0.5

## 2012-07-13 MED ORDER — DEXTROSE 5 % IV SOLN
1.0000 g | Freq: Once | INTRAVENOUS | Status: AC
  Administered 2012-07-13: 1 g via INTRAVENOUS
  Filled 2012-07-13: qty 10

## 2012-07-13 MED ORDER — AZITHROMYCIN 250 MG PO TABS
500.0000 mg | ORAL_TABLET | Freq: Once | ORAL | Status: AC
  Administered 2012-07-13: 500 mg via ORAL
  Filled 2012-07-13: qty 2

## 2012-07-13 MED ORDER — ALBUTEROL SULFATE (5 MG/ML) 0.5% IN NEBU
INHALATION_SOLUTION | RESPIRATORY_TRACT | Status: AC
  Filled 2012-07-13: qty 0.5

## 2012-07-13 MED ORDER — ACETAMINOPHEN 325 MG PO TABS
650.0000 mg | ORAL_TABLET | Freq: Four times a day (QID) | ORAL | Status: DC | PRN
  Administered 2012-07-13: 650 mg via ORAL
  Filled 2012-07-13: qty 2

## 2012-07-13 MED ORDER — IPRATROPIUM BROMIDE 0.02 % IN SOLN
0.5000 mg | Freq: Once | RESPIRATORY_TRACT | Status: AC
  Administered 2012-07-13: 0.5 mg via RESPIRATORY_TRACT
  Filled 2012-07-13: qty 2.5

## 2012-07-13 MED ORDER — SODIUM CHLORIDE 0.9 % IV BOLUS (SEPSIS)
500.0000 mL | Freq: Once | INTRAVENOUS | Status: AC
  Administered 2012-07-13: 500 mL via INTRAVENOUS

## 2012-07-13 NOTE — ED Notes (Signed)
MD in room with pt

## 2012-07-13 NOTE — ED Provider Notes (Signed)
History     CSN: [id number]  Arrival date & time 07/13/12  1640   First MD Initiated Contact with Patient 07/13/12 2004      No chief complaint on file.   (Consider location/radiation/quality/duration/timing/severity/associated sxs/prior treatment) HPI Comments: Patient arrives with daughter from [facility]. Patient has had difficulty sleeping, arthralgias and myalgias primarily in the low back and around hips and pelvis as well as shoulders. Patient is unsure about fever or chills. She has had a dry cough and wheezing although she denies feeling short of breath. She has a history of chronic COPD, having quit smoking 9 years ago. Patient was admitted for a respiratory infection and COPD exacerbation in November. Daughter reports that this taken a, high-dose prednisone was continued from that admission up through about a week and a half ago when it was discovered that she was still on 50 mg prednisone at her cardiologist's office. They ordered a quick taper of her prednisone which was completed 2 days ago. Daughter reports at that time she had significant weight gain and had significant facial swelling likely due to her prednisone use. Patient also reports that she's not been made to sleep in the last 2-3 weeks despite taking Clozaril and now Vistaril. Patient reports also that she's had episodes of significant palpitations, "heart pain" but no current chest pain or back pain. No congestion or rhinorrhea.  Patient had also previously been taken off of her Plavix do to likely side effects from the prednisone use that was not known to her cardiologist at the time. Patient denies currently a headache or stiff neck, rash. She denies any vomiting or diarrhea has continued to have a good appetite. She has a significant history of congestive heart failure, COPD but is not on home oxygen, hyperlipidemia. She also has a prior history of both pneumonia and urinary tract infection. Patient  reports decreased urine output but no dysuria.  The history is provided by the patient, medical records and a relative.    Past Medical History  Diagnosis Date  . CHF (congestive heart failure)   . Diabetes mellitus   . S/P laparoscopic cholecystectomy   . Frozen shoulder     right  . Obesity   . Renal insufficiency   . COPD (chronic obstructive pulmonary disease)     Past Surgical History  Procedure Date  . Partial hysterectomy 1995    History reviewed. No pertinent family history.  History  Substance Use Topics  . Smoking status: Former Smoker -- 1.0 packs/day for 35 years    Quit date: 04/14/2004  . Smokeless tobacco: Not on file  . Alcohol Use: No    OB History    Grav Para Term Preterm Abortions TAB SAB Ect Mult Living                  Review of Systems  Constitutional: Positive for fatigue. Negative for appetite change.  HENT: Negative for congestion, rhinorrhea, neck pain and neck stiffness.   Respiratory: Positive for cough, chest tightness and shortness of breath.   Cardiovascular: Negative for chest pain.  Gastrointestinal: Negative for nausea, vomiting, abdominal pain and diarrhea.  Musculoskeletal: Positive for arthralgias.  Skin: Negative for rash.  Neurological: Positive for weakness. Negative for numbness and headaches.  All other systems reviewed and are negative.    Allergies  Review of patient's allergies indicates no known allergies.  Home Medications   Current Outpatient Rx  Name  Route  Sig  Dispense  Refill  . ACETAMINOPHEN 325 MG PO TABS   Oral   Take 650 mg by mouth every 8 (eight) hours as needed. For pain         . ALBUTEROL SULFATE HFA 108 (90 BASE) MCG/ACT IN AERS   Inhalation   Inhale 1-2 puffs into the lungs every 4 (four) hours as needed. For shortness of breath         . ASPIRIN EC 81 MG PO TBEC   Oral   Take 162 mg by mouth daily.         Marland Kitchen CALCIUM CARBONATE-VITAMIN D 600-400 MG-UNIT PO TABS   Oral   Take 1  tablet by mouth daily.   30 tablet   11   . CLOZAPINE 100 MG PO TABS   Oral   Take 150 mg by mouth at bedtime.          Marland Kitchen CRANBERRY 500 MG PO CAPS   Oral   Take 500 mg by mouth daily.         Marland Kitchen DILTIAZEM HCL ER COATED BEADS 180 MG PO CP24   Oral   Take 180 mg by mouth daily.         Marland Kitchen DOCUSATE SODIUM 100 MG PO CAPS   Oral   Take 1 capsule (100 mg total) by mouth 2 (two) times daily.   10 capsule   0   . FERROUS SULFATE 325 (65 FE) MG PO TBEC   Oral   Take 325 mg by mouth 2 (two) times daily.           . ISOSORBIDE MONONITRATE ER 60 MG PO TB24   Oral   Take 60 mg by mouth daily.         Marland Kitchen LEVOTHYROXINE SODIUM 50 MCG PO TABS   Oral   Take 50 mcg by mouth daily.         Marland Kitchen METOPROLOL SUCCINATE ER 100 MG PO TB24   Oral   Take 100 mg by mouth daily. Take with or immediately following a meal.         . ADULT MULTIVITAMIN W/MINERALS CH   Oral   Take 1 tablet by mouth daily. Oceanographer         . NITROGLYCERIN 0.4 MG SL SUBL   Sublingual   Place 0.4 mg under the tongue every 5 (five) minutes as needed. For chest pain         . FISH OIL 1000 MG PO CAPS   Oral   Take 1 capsule (1,000 mg total) by mouth daily.         Marland Kitchen PANTOPRAZOLE SODIUM 20 MG PO TBEC   Oral   Take 20 mg by mouth daily.         Marland Kitchen POLYETHYLENE GLYCOL 3350 PO PACK   Oral   Take 17 g by mouth daily as needed. For constipation:  Mix with 4-8 oz of fluid and drink         . PRAVASTATIN SODIUM 20 MG PO TABS   Oral   Take 20 mg by mouth at bedtime.         Marland Kitchen TEMAZEPAM 15 MG PO CAPS   Oral   Take 15 mg by mouth at bedtime.         . TRAMADOL HCL 50 MG PO TABS   Oral   Take 1 tablet (50 mg total) by mouth 2 (two) times daily as needed. For severe pain  60 tablet   11   . LANCETS MISC      For checking cbg once daily   100 each   11   . TRUETEST TEST VI STRP      TEST BLOOD GLUCOSE ONCE A DAY   35 each   11     BP 144/58  Pulse 90  Temp 100.9 F  (38.3 C) (Rectal)  Resp 22  SpO2 97%  Physical Exam  Nursing note and vitals reviewed. Constitutional: She appears well-developed and well-nourished.  HENT:  Head: Normocephalic and atraumatic.  Eyes: Conjunctivae normal and EOM are normal. No scleral icterus.  Neck: Normal range of motion. Neck supple.  Cardiovascular: Normal rate, regular rhythm, S1 normal, S2 normal and normal pulses.   No murmur heard. Pulmonary/Chest: Tachypnea noted. No respiratory distress. She has no decreased breath sounds. She has wheezes. She has no rhonchi. She has no rales.  Abdominal: Soft. She exhibits no distension. There is no tenderness.  Musculoskeletal: She exhibits edema. She exhibits no tenderness.  Neurological: She is alert.  Skin: Skin is warm.  Psychiatric: She has a normal mood and affect.    ED Course  Procedures (including critical care time)  Labs Reviewed  CBC WITH DIFFERENTIAL - Abnormal; Notable for the following:    RBC 3.24 (*)     Hemoglobin 9.9 (*)     HCT 28.8 (*)     Platelets 147 (*)     All other components within normal limits  COMPREHENSIVE METABOLIC PANEL - Abnormal; Notable for the following:    Sodium 134 (*)     Potassium 5.2 (*)     Glucose, Bld 189 (*)     BUN 31 (*)     Creatinine, Ser 1.88 (*)     Albumin 3.3 (*)     Alkaline Phosphatase 38 (*)     GFR calc non Af Amer 25 (*)     GFR calc Af Amer 29 (*)     All other components within normal limits  URINALYSIS, ROUTINE W REFLEX MICROSCOPIC - Abnormal; Notable for the following:    Hgb urine dipstick TRACE (*)     Protein, ur 100 (*)     Leukocytes, UA TRACE (*)     All other components within normal limits  PRO B NATRIURETIC PEPTIDE - Abnormal; Notable for the following:    Pro B Natriuretic peptide (BNP) 493.1 (*)     All other components within normal limits  URINE MICROSCOPIC-ADD ON - Abnormal; Notable for the following:    Squamous Epithelial / LPF FEW (*)     All other components within  normal limits  POCT I-STAT TROPONIN I  URINE CULTURE  CULTURE, BLOOD (ROUTINE X 2)  CULTURE, BLOOD (ROUTINE X 2)   Dg Chest 2 View  07/13/2012  *RADIOLOGY REPORT*  Clinical Data: Shortness of breath, cough and congestion.  CHEST - 2 VIEW  Comparison: 04/16/2012 and prior chest radiographs  Findings: The cardiomediastinal silhouette is unremarkable. COPD/emphysema is noted. Left basilar scarring is again identified. There is no evidence of focal airspace disease, pulmonary edema, suspicious pulmonary nodule/mass, pleural effusion, or pneumothorax. No acute bony abnormalities are identified. Degenerative changes within the shoulders again identified.  IMPRESSION: COPD/emphysema without evidence of acute cardiopulmonary disease.   Original Report Authenticated By: Harmon Pier, M.D.      1. COPD (chronic obstructive pulmonary disease)   2. Bronchitis   3. Weakness      Room air saturation is  96% I interpret this to be normal.  10:10 PM Although I do not suspect pneumonia, the patient certainly could have acute bronchitis. Plan is to give both IV Rocephin and by mouth Zithromax. IV Rocephin would also cover for possible urinary tract infection which the patient does complain of some mild dysuria although her urinalysis initially does not suggest UTI. She does have a rectal temperature of 100.9. Blood cultures have been ordered and have contacted family practice to assess the patient for brief admission.  MDM   Patient with minimal pyuria. Only trace leukocyte esterase. I do not feel that this is significant enough to be diagnosed with a urinary tract infection. She does have some chronic renal insufficiency with a BUN of 31 and creatinine of 1.88 which seems close to her baseline. Glucose is mildly elevated. Potassium is minimally elevated at 5.2. This does not require any emergent treatment. She has stable anemia as well at 9.9. Clinically she has significant inspiratory and expiratory wheezing  suggestive of COPD exacerbation. However she has not attended and I do not feel that she has significant hypercarbia. A plan is to give breathing treatments here, IV fluids and will reassess for clinical improvement. Otherwise I will consult family practice for further evaluation, possible admission versus close outpatient followup.  No current CP to suggest ACS.          Gavin Pound. Oletta Lamas, MD 07/13/12 2211

## 2012-07-13 NOTE — ED Notes (Signed)
Hospitalist in room with pt. 

## 2012-07-13 NOTE — ED Notes (Signed)
Pt is here from [facility].  Her daughter is bringing her in for evaluation of generally not feeling well.  Pt has been having some difficulty urinating for the past several days, she has been having "dribbling only and the urine has been foul smelling", pt was found to have UTI but per daughter has not been placed on any antibiotic.  Pt has also been having difficulty sleeping.  Her daughter feels that pt was taken off of prednisone too rapidly (per daughter this 50mg  prednisone which pt was on was prescribed "on accident by SNF")  She has been off of steroids for 2 days.  Daughter is also concerned about her mothers breathing.  Daugher feels that the breathing is "labored".  Pt denies any SOB.  Daughter is also concerned about abdominal distention and flank pain.  Pt is alert and oriented.

## 2012-07-13 NOTE — H&P (Signed)
Family Medicine Teaching [facility] Admission History and Physical Service Pager: [phone]  Patient name: Rebecca [name] Medical record number: [id number] Date of birth: 02-16-1941 Age: 72 y.o. Gender: female  Primary Care Provider: Delbert Harness, MD  Chief Complaint: increased wheezing and dysuria  Assessment and Plan: Rebecca [name] is a 72 y.o. year old female with PMH significant for COPD, DM type II (diet-controlled), chronic renal insufficiency with related anemia, CAD s/p stent, CHF with preserved EF (last echo 05/2011), HTN, HLD, hypothyroidism, chronic constipation and schizoaffective disorder (on Clozaril) presenting with several days of worsening wheezing/coughing most likely representing mild COPD exacerbation. Also with several days of dysuria/UTI-type symptoms but no great frank evidence of infection on labs or UA. EKG unchanged and CXR not suspicious for pneumonia. No elevated WBC but pt with fever to 100.9 in the ED. Recent unintentional long course of prednisone 50 mg daily (10 weeks) and recent rapid taper (see HPI), likely contributing to some rebound symptoms. Admitting to FPTS 1/30, telemetry bed, Dr. Deirdre Priest admitting provider.  #Dyspnea with hx of COPD - acute exacerbation +/- rebound symptoms from recent rapid steroid taper (see HPI) -DDx could also include PNA, influenza, component of CHF (see below); PE or MI very unlikely -Empiric abx started in the ED, Rocephin IV and azithromycin PO; will continue for now and de-escalate as appropriate -normally would treat with systemic steroids for acute exacerbation but will hold for tonight/reassess tomorrow  -given rapid prednisone taper and duration of previous therapy, pt likely will need longer taper  -likely some component of adrenal insufficiency and rebound symptoms contributing to current status  -could consider full burst (50 mg x5 days) or 20 mg daily with planned taper off over 4 or more weeks - Will more than likely  do low dose with prolonged taper  -will discuss with day FPTS team/attending -Duonebs q6 while awake, with q2 nebulizers PRN -continue O2 via [place] as needed  -pt does not use O2 at home; may have undiagnosed/subclinical OSA-type sx  -will check ambulating O2 on RA and O2 and order home health O2 if appropriate -telemetry monitoring and pulse-ox with vital checks -incentive spirometry while awake  #Dysuria/Chronic renal impairment with related anemia - dysuria symptoms appear acute-on-chronic -K mildly elevated on admit, 5.2; admission Cr 1.8 (?baseline ~1.5; many varying readings in old records) -UA and not frankly concerning for UTI, but urine and blood cultures culture pending -continue Rocephin/azithromycin as above; will adjust/transition to PO as appropriate -Pt seen as an outpt by Dr. Kathrene Bongo, has been seen by urology in the past; no current need for consult -Hb stable, basline ~9-10; continue home iron -careful IVF, then likely saline lock once ensured PO intake -will monitor renal function daily  #Cardiac: HTN, CHF with preserved EF (echo 05/2011), hx of CAD s/p stent, HLD - BP's reasonable on home meds -no current chest pain, EKG shows NSR without new findings  -POC troponin negative x1  -will check troponin-I x1 and repeat EKG if necessary -continue daily ASA, statin (Zocor substituted for home Pravachol) -continue home meds: Toprol-XL 100 mg daily, Imdur 24h tablet 60 mg daily, Cardizem CD 180 mg daily -BNP mildly elevated (vs age-related normal, and also with renal insufficieny ); no previous level found in EPIC  -pt does not appear clinically greatly volume-overloaded and does not have daily diuretic meds at home  -may consider repeat echo during this admission and will evaluate for any need for diuresis, daily  #DM - type II, diet-controlled at  home. Last A1c ~6 in November -will check CBG's qAC+HS and cover with SSI as needed -do not anticipate need to start new  medication  #Hypothyroidism - on Synthroid at home -continue Synthroid -will check TSH  #Schizoaffective disorder/insomnia - Currently on Clozaril. Recently seen by outpt psychiatrist Dr. Donell Beers -continue Clozaril daily -Restoril for sleep, PRN  #Chronic constipation/GERD - No current complaints, other than some possible mild heartburn component of abd tenderness -continue Miralax, Colace, Protonix (home regimen)  FEN/GI: carb-modified diet, NS @ 50 mL/h, likely to saline lock with proof of good PO intake Prophylaxis: SubQ heparin, therapeutic PPI Disposition: Management as above. Anticipate discharge within 1-3 days with improvement of clinical symptoms.  -PT/OT consult requested for evaluation for possible rehab vs home health  -will involve CSW/CM as needed for any placement or home health/equipment needs Code Status: Full  History of Present Illness: Rebecca [name] is a 72 y.o. year old female presenting with several days of increased wheezing/chest congestion and intermittent dysuria symptoms. History provided by pt and daughter Rebecca [name], [phone], HCPOA). Background: Pt was admitted in November for COPD and discharged to [facility], with intended course of prednisone 50 mg daily for 4 days, which was continued from November 6 to January 15, daily. Pt saw a PA at her cardiologist's office that day for some heart palpitations and the error in med administration was seen, and pt was tapered off prednisone rapidly (down 10 mg per day every 3 days), with last dose of 10 mg on 1/27. Since then, pt has had some increased wheezing and "strangling" sensation with breathing, but denies true SOB. Pt endorses some "phlegmy cough," but no hemoptysis or large production of sputum. Since pt has been on prednisone (and rapidly tapering off), daughter has also been concerned for weight gain, "puffiness" in pt's face, and the respiratory symptoms described.  Pt also has been complaining of  dysuria for the past few days, intermittently; pt had one ~24h period of no urination and yesterday describes burning on urination, increased urge/frequency, and "dribbling." Pt has had intermittent urinary retention and UTI's in the past, sometimes associated with her chronic constipation, which is controlled with daily Miralax.  Otherwise, pt denies fever/chills, N/V. Does endorse some diffuse, vaguely-defined abdominal pain and has chronic constipation, but states her bowels have been moving normally, recently. No current chest pain or heart palpitations, no headache or body aches. Pt does endorse chronic back and arthritic-type joint pain, not new/worse than usual. Hx of schizoaffective disorder, well-controlled with Clozaril; no current SI/HI/AH/VH or mania-type symptoms.  Daughter is unhappy with care at [facility] for the medication error above as well as other complaints related to diet and general supervision and is removing pt from residence there as of Jan 30/31. She also states she wants no [facility] providers to discuss pt or pt's care with representatives from [facility].  Surg Hx: s/p stenting "years ago" Medications: per pharmacy med rec Allergies: NKDA  Patient Active Problem List  Diagnosis  . HYPOTHYROIDISM, UNSPECIFIED  . DIABETES MELLITUS, II, COMPLICATIONS  . HYPERCHOLESTEROLEMIA  . GOUT  . OBESITY, NOS  . ANEMIA, NORMOCYTIC, CHRONIC  . SCHIZOAFFECTIVE DISORDER  . CONDUCTIVE HEARING LOSS UNILATERAL  . HYPERTENSION, BENIGN SYSTEMIC  . CAD  . HEMORRHOIDS, NOS  . GASTROESOPHAGEAL REFLUX, NO ESOPHAGITIS  . CONSTIPATION, CHRONIC  . RENAL INSUFFICIENCY, CHRONIC  . PRURITUS  . ARTHRITIS  . LOW BACK PAIN, CHRONIC  . LEG CRAMPS  . COPD, MILD  .  Foot pain  . History of small bowel obstruction  . Memory impairment  . Chronic constipation  . Lower urinary tract symptoms  . Gait instability  . Hand pain  . Trochanteric bursitis of right hip  . Productive cough   . UTI (lower urinary tract infection)   Past Medical History: Past Medical History  Diagnosis Date  . CHF (congestive heart failure)   . Diabetes mellitus   . S/P laparoscopic cholecystectomy   . Frozen shoulder     right  . Obesity   . Renal insufficiency   . COPD (chronic obstructive pulmonary disease)    Past Surgical History: Past Surgical History  Procedure Date  . Partial hysterectomy 1995   Social History: History  Substance Use Topics  . Smoking status: Former Smoker -- 1.0 packs/day for 35 years    Quit date: 04/14/2004  . Smokeless tobacco: Not on file  . Alcohol Use: No   For any additional social history documentation, please refer to relevant sections of EMR.  Family History: History reviewed. No pertinent family history. Allergies: No Known Allergies No current facility-administered medications on file prior to encounter.   Current Outpatient Prescriptions on File Prior to Encounter  Medication Sig Dispense Refill  . albuterol (PROVENTIL HFA;VENTOLIN HFA) 108 (90 BASE) MCG/ACT inhaler Inhale 1-2 puffs into the lungs every 4 (four) hours as needed. For shortness of breath      . Calcium Carbonate-Vitamin D (CALCIUM 600+D) 600-400 MG-UNIT per tablet Take 1 tablet by mouth daily.  30 tablet  11  . clozapine (CLOZARIL) 100 MG tablet Take 150 mg by mouth at bedtime.       . docusate sodium (COLACE) 100 MG capsule Take 1 capsule (100 mg total) by mouth 2 (two) times daily.  10 capsule  0  . ferrous sulfate 325 (65 FE) MG EC tablet Take 325 mg by mouth 2 (two) times daily.        . isosorbide mononitrate (IMDUR) 60 MG 24 hr tablet Take 60 mg by mouth daily.      Marland Kitchen levothyroxine (SYNTHROID, LEVOTHROID) 50 MCG tablet Take 50 mcg by mouth daily.      . metoprolol succinate (TOPROL-XL) 100 MG 24 hr tablet Take 100 mg by mouth daily. Take with or immediately following a meal.      . nitroGLYCERIN (NITROSTAT) 0.4 MG SL tablet Place 0.4 mg under the tongue every 5  (five) minutes as needed. For chest pain      . Omega-3 Fatty Acids (FISH OIL) 1000 MG CAPS Take 1 capsule (1,000 mg total) by mouth daily.      . temazepam (RESTORIL) 15 MG capsule Take 15 mg by mouth at bedtime.      . traMADol (ULTRAM) 50 MG tablet Take 1 tablet (50 mg total) by mouth 2 (two) times daily as needed. For severe pain  60 tablet  11  . Lancets MISC For checking cbg once daily  100 each  11  . TRUETEST TEST test strip TEST BLOOD GLUCOSE ONCE A DAY  35 each  11   Review Of Systems: Per HPI.  Physical Exam: BP 150/62  Pulse 88  Temp 100.9 F (38.3 C) (Rectal)  Resp 23  SpO2 92% Exam: General: obese elderly female lying in bed in NAD, pleasant and cooperative; general body habitus Cushingoid. Talking in full sentences HEENT: /AT, TM's clear bilaterally with large amount of wax in bilateral external canals, EOMI, PERRLA, MM slightly dry Cardiovascular: RRR, no murmur  appreciated, no chest wall tenderness Respiratory: good air movement bilaterally but loud frank wheezes throughout and very protracted expiratory phase  Mild-moderately increased work of breathing but sats >94% on room air  Some coughing with deep breathing for exam but otherwise speaking in full sentences Abdomen: obese, soft, diffusely very mildly tender to palpation, BS+ Extremities: distal pulses 2+/symmetric, trace symmetric LE edema Skin: healing ecchymoses to bilateral forearms, no frank bleeding; otherwise warm, dry Neuro: alert/oriented x4, no gross focal deficit; no facial droop, moves all extremities equally/spontaneously  Labs and Imaging: CBC BMET   Lab 07/13/12 1738  WBC 9.6  HGB 9.9*  HCT 28.8*  PLT 147*    Lab 07/13/12 1738  NA 134*  K 5.2*  CL 96  CO2 26  BUN 31*  CREATININE 1.88*  GLUCOSE 189*  CALCIUM 9.1     Pro-BNP, 1/30 @1755 : 493.1 Troponin (POC), 1/30 @ 2108: negative  Results for Reza, Otisha A (MRN [id number]) as of 07/13/2012 23:17  07/13/2012 18:13  Color, Urine  YELLOW  APPearance CLEAR  Specific Gravity, Urine 1.030  pH 6.0  Glucose NEGATIVE  Bilirubin Urine NEGATIVE  Ketones, ur NEGATIVE  Protein 100 (A)  Urobilinogen, UA 0.2  Nitrite NEGATIVE  Leukocytes, UA TRACE (A)  Hgb urine dipstick TRACE (A)  Urine-Other LESS THAN 10 mL OF URINE SUBMITTED  WBC, UA 3-6  RBC / HPF 0-2  Squamous Epithelial / LPF FEW (A)  Bacteria, UA RARE   Microbiology:  Blood cultures, 1/30 @2216  and @2230 : pending  Urine culture, 1/30 @1813 : pending  CXR, 1/30 @1811  Findings: The cardiomediastinal silhouette is unremarkable.  COPD/emphysema is noted.  Left basilar scarring is again identified.  There is no evidence of focal airspace disease, pulmonary edema,  suspicious pulmonary nodule/mass, pleural effusion, or  pneumothorax.  No acute bony abnormalities are identified.  Degenerative changes within the shoulders again identified.  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  Street, [place], MD 07/13/2012, 11:43 PM  PGY-2 Addendum: I have seen and examined patient. I agree with Dr. Timothy Lasso note above. Will admit for closer observation. Main decision will be steroid regimen as well as placement at discharge.  Faiz Weber M. Earland Reish, M.D. 07/14/2012 8:06 AM

## 2012-07-14 ENCOUNTER — Encounter (HOSPITAL_COMMUNITY): Payer: Self-pay | Admitting: *Deleted

## 2012-07-14 DIAGNOSIS — N179 Acute kidney failure, unspecified: Secondary | ICD-10-CM

## 2012-07-14 DIAGNOSIS — N039 Chronic nephritic syndrome with unspecified morphologic changes: Secondary | ICD-10-CM

## 2012-07-14 DIAGNOSIS — J441 Chronic obstructive pulmonary disease with (acute) exacerbation: Secondary | ICD-10-CM

## 2012-07-14 DIAGNOSIS — D631 Anemia in chronic kidney disease: Secondary | ICD-10-CM

## 2012-07-14 LAB — CBC WITH DIFFERENTIAL/PLATELET
Basophils Absolute: 0 10*3/uL (ref 0.0–0.1)
Basophils Relative: 0 % (ref 0–1)
Eosinophils Absolute: 0 K/uL (ref 0.0–0.7)
Eosinophils Relative: 0 % (ref 0–5)
HCT: 27.2 % — ABNORMAL LOW (ref 36.0–46.0)
Hemoglobin: 9.3 g/dL — ABNORMAL LOW (ref 12.0–15.0)
Lymphocytes Relative: 24 % (ref 12–46)
Lymphs Abs: 2.1 10*3/uL (ref 0.7–4.0)
MCH: 30.5 pg (ref 26.0–34.0)
MCHC: 34.2 g/dL (ref 30.0–36.0)
MCV: 89.2 fL (ref 78.0–100.0)
Monocytes Absolute: 0.8 K/uL (ref 0.1–1.0)
Monocytes Relative: 9 % (ref 3–12)
Neutro Abs: 5.9 10*3/uL (ref 1.7–7.7)
Neutrophils Relative %: 67 % (ref 43–77)
Platelets: 131 10*3/uL — ABNORMAL LOW (ref 150–400)
RBC: 3.05 MIL/uL — ABNORMAL LOW (ref 3.87–5.11)
RDW: 15.3 % (ref 11.5–15.5)
WBC: 8.8 10*3/uL (ref 4.0–10.5)

## 2012-07-14 LAB — COMPREHENSIVE METABOLIC PANEL WITH GFR
ALT: 23 U/L (ref 0–35)
AST: 15 U/L (ref 0–37)
CO2: 25 meq/L (ref 19–32)
Calcium: 8.4 mg/dL (ref 8.4–10.5)
Chloride: 97 meq/L (ref 96–112)
GFR calc non Af Amer: 27 mL/min — ABNORMAL LOW (ref >90)
Sodium: 133 meq/L — ABNORMAL LOW (ref 135–145)

## 2012-07-14 LAB — COMPREHENSIVE METABOLIC PANEL
Albumin: 2.9 g/dL — ABNORMAL LOW (ref 3.5–5.2)
Alkaline Phosphatase: 32 U/L — ABNORMAL LOW (ref 39–117)
BUN: 29 mg/dL — ABNORMAL HIGH (ref 6–23)
Creatinine, Ser: 1.78 mg/dL — ABNORMAL HIGH (ref 0.50–1.10)
GFR calc Af Amer: 31 mL/min — ABNORMAL LOW (ref >90)
Glucose, Bld: 191 mg/dL — ABNORMAL HIGH (ref 70–99)
Potassium: 4.1 mEq/L (ref 3.5–5.1)
Total Bilirubin: 0.2 mg/dL — ABNORMAL LOW (ref 0.3–1.2)
Total Protein: 6.1 g/dL (ref 6.0–8.3)

## 2012-07-14 LAB — INFLUENZA PANEL BY PCR (TYPE A & B): H1N1 flu by pcr: NOT DETECTED

## 2012-07-14 LAB — GLUCOSE, CAPILLARY
Glucose-Capillary: 155 mg/dL — ABNORMAL HIGH (ref 70–99)
Glucose-Capillary: 199 mg/dL — ABNORMAL HIGH (ref 70–99)
Glucose-Capillary: 150 mg/dL — ABNORMAL HIGH (ref 70–99)

## 2012-07-14 LAB — MRSA PCR SCREENING: MRSA by PCR: NEGATIVE

## 2012-07-14 LAB — TROPONIN I: Troponin I: <0.3 ng/mL (ref <0.30)

## 2012-07-14 MED ORDER — GUAIFENESIN ER 600 MG PO TB12
600.0000 mg | ORAL_TABLET | Freq: Two times a day (BID) | ORAL | Status: DC
  Administered 2012-07-14 – 2012-07-15 (×3): 600 mg via ORAL
  Filled 2012-07-14 (×4): qty 1

## 2012-07-14 MED ORDER — TEMAZEPAM 15 MG PO CAPS
15.0000 mg | ORAL_CAPSULE | Freq: Every day | ORAL | Status: DC
  Administered 2012-07-14: 15 mg via ORAL
  Filled 2012-07-14: qty 1

## 2012-07-14 MED ORDER — SIMVASTATIN 10 MG PO TABS
10.0000 mg | ORAL_TABLET | Freq: Every day | ORAL | Status: DC
  Administered 2012-07-14: 10 mg via ORAL
  Filled 2012-07-14 (×2): qty 1

## 2012-07-14 MED ORDER — GUAIFENESIN ER 600 MG PO TB12: 600.0000 mg | ORAL_TABLET | Freq: Two times a day (BID) | ORAL | Status: DC

## 2012-07-14 MED ORDER — PREDNISONE 10 MG PO TABS: ORAL_TABLET | ORAL | Status: DC

## 2012-07-14 MED ORDER — INSULIN ASPART 100 UNIT/ML ~~LOC~~ SOLN
0.0000 [IU] | Freq: Three times a day (TID) | SUBCUTANEOUS | Status: DC
  Administered 2012-07-14 – 2012-07-15 (×4): 3 [IU] via SUBCUTANEOUS

## 2012-07-14 MED ORDER — ONDANSETRON HCL 4 MG/2ML IJ SOLN: 4.0000 mg | Freq: Four times a day (QID) | INTRAMUSCULAR | Status: DC | PRN

## 2012-07-14 MED ORDER — ADULT MULTIVITAMIN W/MINERALS CH
1.0000 | ORAL_TABLET | Freq: Every day | ORAL | Status: DC
  Administered 2012-07-14 – 2012-07-15 (×2): 1 via ORAL
  Filled 2012-07-14 (×2): qty 1

## 2012-07-14 MED ORDER — AZITHROMYCIN 500 MG PO TABS
500.0000 mg | ORAL_TABLET | Freq: Every day | ORAL | Status: DC
  Administered 2012-07-14: 500 mg via ORAL
  Filled 2012-07-14: qty 1

## 2012-07-14 MED ORDER — DEXTROSE 5 % IV SOLN: 1.0000 g | INTRAVENOUS | Status: DC

## 2012-07-14 MED ORDER — LANCETS MISC: Status: DC

## 2012-07-14 MED ORDER — FLUTICASONE-SALMETEROL 250-50 MCG/DOSE IN AEPB: 1.0000 | INHALATION_SPRAY | Freq: Every day | RESPIRATORY_TRACT | Status: DC

## 2012-07-14 MED ORDER — ISOSORBIDE MONONITRATE ER 60 MG PO TB24
60.0000 mg | ORAL_TABLET | Freq: Every day | ORAL | Status: DC
  Administered 2012-07-14 – 2012-07-15 (×2): 60 mg via ORAL
  Filled 2012-07-14 (×2): qty 1

## 2012-07-14 MED ORDER — DOCUSATE SODIUM 100 MG PO CAPS
100.0000 mg | ORAL_CAPSULE | Freq: Two times a day (BID) | ORAL | Status: DC
  Administered 2012-07-14 – 2012-07-15 (×4): 100 mg via ORAL
  Filled 2012-07-14 (×5): qty 1

## 2012-07-14 MED ORDER — ASPIRIN EC 81 MG PO TBEC
162.0000 mg | DELAYED_RELEASE_TABLET | Freq: Every day | ORAL | Status: DC
  Administered 2012-07-14 – 2012-07-15 (×2): 162 mg via ORAL
  Filled 2012-07-14 (×2): qty 2

## 2012-07-14 MED ORDER — INSULIN ASPART 100 UNIT/ML ~~LOC~~ SOLN: 0.0000 [IU] | Freq: Every day | SUBCUTANEOUS | Status: DC

## 2012-07-14 MED ORDER — ONDANSETRON HCL 4 MG PO TABS: 4.0000 mg | ORAL_TABLET | Freq: Four times a day (QID) | ORAL | Status: DC | PRN

## 2012-07-14 MED ORDER — LEVOFLOXACIN 750 MG PO TABS: 750.0000 mg | ORAL_TABLET | ORAL | Status: DC

## 2012-07-14 MED ORDER — DILTIAZEM HCL ER COATED BEADS 180 MG PO CP24
180.0000 mg | ORAL_CAPSULE | Freq: Every day | ORAL | Status: DC
  Administered 2012-07-14 – 2012-07-15 (×2): 180 mg via ORAL
  Filled 2012-07-14 (×2): qty 1

## 2012-07-14 MED ORDER — IPRATROPIUM BROMIDE 0.02 % IN SOLN
0.5000 mg | Freq: Four times a day (QID) | RESPIRATORY_TRACT | Status: DC
  Administered 2012-07-14 – 2012-07-15 (×5): 0.5 mg via RESPIRATORY_TRACT
  Filled 2012-07-14 (×6): qty 2.5

## 2012-07-14 MED ORDER — HEPARIN SODIUM (PORCINE) 5000 UNIT/ML IJ SOLN
5000.0000 [IU] | Freq: Three times a day (TID) | INTRAMUSCULAR | Status: DC
  Administered 2012-07-14 – 2012-07-15 (×4): 5000 [IU] via SUBCUTANEOUS
  Filled 2012-07-14 (×8): qty 1

## 2012-07-14 MED ORDER — LEVOTHYROXINE SODIUM 50 MCG PO TABS
50.0000 ug | ORAL_TABLET | Freq: Every day | ORAL | Status: DC
  Administered 2012-07-14 – 2012-07-15 (×2): 50 ug via ORAL
  Filled 2012-07-14 (×2): qty 1

## 2012-07-14 MED ORDER — METOPROLOL SUCCINATE ER 100 MG PO TB24
100.0000 mg | ORAL_TABLET | Freq: Every day | ORAL | Status: DC
  Administered 2012-07-14 – 2012-07-15 (×2): 100 mg via ORAL
  Filled 2012-07-14 (×2): qty 1

## 2012-07-14 MED ORDER — SODIUM CHLORIDE 0.9 % IJ SOLN
3.0000 mL | Freq: Two times a day (BID) | INTRAMUSCULAR | Status: DC
  Administered 2012-07-14: 3 mL via INTRAVENOUS

## 2012-07-14 MED ORDER — LEVOFLOXACIN 750 MG PO TABS
750.0000 mg | ORAL_TABLET | ORAL | Status: DC
  Administered 2012-07-14: 750 mg via ORAL
  Filled 2012-07-14: qty 1

## 2012-07-14 MED ORDER — SODIUM CHLORIDE 0.45 % IV SOLN
INTRAVENOUS | Status: DC
  Administered 2012-07-14: 01:00:00 via INTRAVENOUS

## 2012-07-14 MED ORDER — FERROUS SULFATE 325 (65 FE) MG PO TABS
325.0000 mg | ORAL_TABLET | Freq: Two times a day (BID) | ORAL | Status: DC
  Administered 2012-07-14 – 2012-07-15 (×4): 325 mg via ORAL
  Filled 2012-07-14 (×5): qty 1

## 2012-07-14 MED ORDER — ALBUTEROL SULFATE (5 MG/ML) 0.5% IN NEBU
5.0000 mg | INHALATION_SOLUTION | Freq: Four times a day (QID) | RESPIRATORY_TRACT | Status: DC
  Administered 2012-07-14 – 2012-07-15 (×5): 5 mg via RESPIRATORY_TRACT
  Filled 2012-07-14 (×10): qty 0.5

## 2012-07-14 MED ORDER — LEVOFLOXACIN 750 MG PO TABS: 750.0000 mg | ORAL_TABLET | Freq: Every day | ORAL | Status: DC

## 2012-07-14 MED ORDER — CLOZAPINE 25 MG PO TABS
150.0000 mg | ORAL_TABLET | Freq: Every day | ORAL | Status: DC
  Administered 2012-07-14 (×2): 150 mg via ORAL
  Filled 2012-07-14 (×3): qty 2

## 2012-07-14 MED ORDER — OSELTAMIVIR PHOSPHATE 75 MG PO CAPS
75.0000 mg | ORAL_CAPSULE | Freq: Two times a day (BID) | ORAL | Status: DC
  Administered 2012-07-14 (×2): 75 mg via ORAL
  Filled 2012-07-14 (×3): qty 1

## 2012-07-14 MED ORDER — PANTOPRAZOLE SODIUM 20 MG PO TBEC
20.0000 mg | DELAYED_RELEASE_TABLET | Freq: Every day | ORAL | Status: DC
  Administered 2012-07-14 – 2012-07-15 (×2): 20 mg via ORAL
  Filled 2012-07-14 (×2): qty 1

## 2012-07-14 MED ORDER — POLYETHYLENE GLYCOL 3350 17 G PO PACK
17.0000 g | PACK | Freq: Two times a day (BID) | ORAL | Status: DC
  Administered 2012-07-14 – 2012-07-15 (×4): 17 g via ORAL
  Filled 2012-07-14 (×5): qty 1

## 2012-07-14 MED ORDER — ALBUTEROL SULFATE (5 MG/ML) 0.5% IN NEBU
5.0000 mg | INHALATION_SOLUTION | RESPIRATORY_TRACT | Status: DC | PRN
  Administered 2012-07-15: 5 mg via RESPIRATORY_TRACT

## 2012-07-14 NOTE — Progress Notes (Signed)
Family Medicine Teaching Service Daily Progress Note Intern Pager: [phone]  Patient name: Luise Yamamoto Fetty Medical record number: [id number] Date of birth: [birth year] Age: 72 y.o. Gender: female  Primary Care Provider: Delbert Harness, MD  Subjective: Pt seen at bedside this morning. States she feels "okay," no new pain or complaints. Feels like breathing is somewhat improved (see exam). Denies chest pain, N/V, fevers, abdominal pain. Still not "urinating well." No new questions/concerns.  Objective: Temp:  [98 F (36.7 C)-100.9 F (38.3 C)] 98 F (36.7 C) (01/31 0029) Pulse Rate:  [84-91] 90  (01/31 0838) Resp:  [20-25] 22  (01/31 0029) BP: (109-150)/(49-62) 140/52 mmHg (01/31 0029) SpO2:  [92 %-99 %] 99 % (01/31 0838) FiO2 (%):  [28 %] 28 % (01/31 0031) Weight:  [190 lb 11.2 oz (86.5 kg)] 190 lb 11.2 oz (86.5 kg) (01/31 0032) Exam: General: sitting up at bedside, eating breakfast, speaking in full sentences, comfortable on 2L [place] Cardiovascular: RRR, no murmur appreciated Respiratory: diffuse loud wheezes throughout with mildly increased work of breathing but not distressed Abdomen: soft, nontender, nondistended; obese, BS+ Extremities: moves all extremities equally/spontaneously, distal pulses intact/symmetric  Laboratory:  Lab 07/14/12 0120 07/13/12 1738  WBC 8.8 9.6  HGB 9.3* 9.9*  HCT 27.2* 28.8*  PLT 131* 147*    Lab 07/14/12 0120 07/13/12 1738  NA 133* 134*  K 4.1 5.2*  CL 97 96  CO2 25 26  BUN 29* 31*  CREATININE 1.78* 1.88*  CALCIUM 8.4 9.1  PROT 6.1 6.4  BILITOT 0.2* 0.3  ALKPHOS 32* 38*  ALT 23 25  AST 15 18  GLUCOSE 191* 189*   Pro-BNP, 1/30 @1755 : 493.1  Troponin (POC), 1/30 @ 2108: negative    07/13/2012 18:13   Color, Urine  YELLOW   APPearance  CLEAR   Specific Gravity, Urine  1.030   pH  6.0   Glucose  NEGATIVE   Bilirubin Urine  NEGATIVE   Ketones, ur  NEGATIVE   Protein  100 (A)   Urobilinogen, UA  0.2   Nitrite  NEGATIVE   Leukocytes, UA   TRACE (A)   Hgb urine dipstick  TRACE (A)   Urine-Other  LESS THAN 10 mL OF URINE SUBMITTED   WBC, UA  3-6   RBC / HPF  0-2   Squamous Epithelial / LPF  FEW (A)   Bacteria, UA  RARE    Microbiology:   Blood cultures, 1/30 @2216  and @2230 : pending   Urine culture, 1/30 @1813 : pending  Imaging/Diagnostic Tests: CXR, 1/30 @1811   Findings: The cardiomediastinal silhouette is unremarkable.  COPD/emphysema is noted.  Left basilar scarring is again identified.  There is no evidence of focal airspace disease, pulmonary edema,  suspicious pulmonary nodule/mass, pleural effusion, or  pneumothorax.  No acute bony abnormalities are identified.  Degenerative changes within the shoulders again identified.  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  Assessment and Plan: Tameko Halder Butkus is a 72 y.o. year old female with PMH significant for COPD, DM type II (diet-controlled), chronic renal insufficiency with related anemia, CAD s/p stent, CHF with preserved EF (last echo 05/2011), HTN, HLD, hypothyroidism, chronic constipation and schizoaffective disorder (on Clozaril) presenting with several days of worsening wheezing/coughing most likely representing mild COPD exacerbation. Also with several days of dysuria/UTI-type symptoms but no great frank evidence of infection on labs or UA. EKG unchanged and CXR not suspicious for pneumonia. No elevated WBC but pt with fever to 100.9 in the ED. Recent  unintentional long course of prednisone 50 mg daily (10 weeks) and recent rapid taper (see HPI), likely contributing to some rebound symptoms. Admitting to FPTS 1/30, telemetry bed, Dr. Deirdre Priest admitting provider.   #Dyspnea with hx of COPD - acute exacerbation +/- rebound symptoms from recent rapid steroid taper (see H&P/HPI)  -DDx could also include PNA, influenza, component of CHF (see below); PE or MI very unlikely  -Empiric abx started in the ED  -Rocephin IV and  azithromycin PO  -transition to Levaquin PO, likely total of 10 days -flu panel by PCR negative, empiric Tamiflu started --> D/C -normally would treat with systemic steroids for acute exacerbation but will hold for tonight/reassess tomorrow   -given rapid prednisone taper and duration of previous therapy, pt likely will need longer taper   -likely some component of adrenal insufficiency and rebound symptoms contributing to current status   -will start prednisone ~20 mg daily with planned long, slow taper off over 4 or more weeks -otherwise continue Duonebs q6 while awake, with q2 nebulizers PRN;  O2 via [place] as needed, IS while awake -guaifenisen for congestion -pt does not use O2 at home; may have undiagnosed/subclinical OSA-type sx  -will check ambulating O2 on RA and O2 and order home health O2 if appropriate  -continue telemetry monitoring and pulse-ox with vital checks   #Dysuria/Chronic renal impairment with related anemia - dysuria symptoms appear acute-on-chronic   -K mildly elevated on admit, 5.2; admission Cr 1.8 (?baseline ~1.5; many varying readings in old records)   -UA and not frankly concerning for UTI, but urine and blood cultures culture pending  -K 4.1 and Cr 1.78 on 1/31 -continue Rocephin/azithromycin as above; will f/u cultures and adjust/transition abx to PO as appropriate  -Pt seen as an outpt by Dr. Kathrene Bongo, has been seen by urology in the past; no current need for consult  -Hb stable, basline ~9-10; continue home iron  -initially on gentle IVF --> saline lock IV with good PO intake -will monitor renal function daily   #Cardiac: HTN, CHF with preserved EF (echo 05/2011), hx of CAD s/p stent, HLD  -BP's reasonable on home meds, ~140's/60's -no current chest pain, EKG shows NSR without new findings  -POC troponin negative x1, regular troponin-I also negative -continue daily ASA, statin (Zocor substituted for home Pravachol)  -continue home meds: Toprol-XL 100 mg  daily, Imdur 24h tablet 60 mg daily, Cardizem CD 180 mg daily  -BNP mildly elevated (vs age-related normal, and also with renal insufficieny ); no previous level found in EPIC   -pt does not appear clinically greatly volume-overloaded and does not have daily diuretic meds at home   -may consider repeat echo during this admission and will evaluate for any need for diuresis, daily   #DM - type II, diet-controlled at home. Last A1c ~6 in November. CBG's ~100-200. -new A1c not checked; likely to be elevated given prolonged steroid course -will check CBG's qAC+HS and cover with SSI as needed  -do not anticipate need to start new medication   #Hypothyroidism - on Synthroid at home  -continue Synthroid  -TSH wnl  #Schizoaffective disorder/insomnia - Currently on Clozaril. Recently seen by outpt psychiatrist Dr. Donell Beers  -continue Clozaril daily  -Restoril for sleep, PRN   #Chronic constipation/GERD - No current complaints, other than some possible mild heartburn component of abd tenderness  -continue Miralax, Colace, Protonix (home regimen)  FEN/GI: carb-modified diet saline lock IV PPx: subQ heparin, therapeutic PPI Dispo: Management as above.  -home health  PT order placed, will f/u with case management  -possible D/C home today Code Status: Full Code  Yehonatan Grandison, [place], MD 07/14/2012, 9:04 AM

## 2012-07-14 NOTE — Evaluation (Signed)
Occupational Therapy Evaluation and Discharge Patient Details Name: Rebecca [name] MRN: [id number] DOB: 05-Sep-1940 Today's Date: 07/14/2012 Time: 0833-0900 OT Time Calculation (min): 27 min  OT Assessment / Plan / Recommendation Clinical Impression  This 72 yo female admitted with increased wheezing and dysuria presents to acute OT at a Min guard---S level. Per pt she will have her daughter with her all the time at home. No further OT neerds, will sign off.    OT Assessment  Patient does not need any further OT services    Follow Up Recommendations  No OT follow up;Supervision/Assistance - 24 hour ((if does not have 24 hour S/A, then SNF))       Equipment Recommendations  None recommended by OT          Precautions / Restrictions Precautions Precautions: Fall Restrictions Weight Bearing Restrictions: No   Pertinent Vitals/Pain 99% on RA with to the door and back     ADL  Eating/Feeding: Performed;Independent Where Assessed - Eating/Feeding: Edge of bed Grooming: Performed;Wash/dry face;Set up Where Assessed - Grooming: Unsupported sitting Upper Body Bathing: Simulated;Set up Where Assessed - Upper Body Bathing: Unsupported sitting Lower Body Bathing: Performed;Minimal assistance Where Assessed - Lower Body Bathing: Unsupported sit to stand Upper Body Dressing: Simulated;Set up Where Assessed - Upper Body Dressing: Unsupported sitting Lower Body Dressing: Minimal assistance;Performed Where Assessed - Lower Body Dressing: Unsupported sit to stand Toilet Transfer: Performed;Min guard Toilet Transfer Method: Surveyor, minerals: Bedside commode Toileting - Clothing Manipulation and Hygiene: Performed;Min guard Where Assessed - Engineer, mining and Hygiene: Standing Equipment Used: Gait belt Transfers/Ambulation Related to ADLs: Min guard A for all        Visit Information  Last OT Received On: 07/14/12 Assistance Needed: +1 PT/OT  Co-Evaluation/Treatment: Yes    Subjective Data  Subjective: I am going to go to my daughters when I leave here. Patient Stated Goal: Go home today   Prior Functioning     Home Living Lives With: Alone Available Help at Discharge: Family;Available 24 hours/day Type of Home: House Home Access: Stairs to enter [employer] of Steps: 4 Entrance Stairs-Rails: Right Home Layout: Two level;Able to live on main level with bedroom/bathroom Bathroom Shower/Tub: Health visitor: Standard Bathroom Accessibility: Yes How Accessible: Accessible via walker Home Adaptive Equipment: Walker - rolling;Shower chair with back Prior Function Level of Independence: Independent with assistive device(s) (RW) Able to Take Stairs?: Yes Driving: No Vocation: Retired Comments: was living at assisted living but per notes plans are not to return there, per patient report she will be returning to her daughters home; reports she was getting therapy at the ALF prior to admission Communication Communication: No difficulties Dominant Hand: Right            Cognition  Overall Cognitive Status: Impaired Area of Impairment: Memory;Problem solving Arousal/Alertness: Awake/alert Orientation Level: Appears intact for tasks assessed Behavior During Session: [facility] for tasks performed Memory Deficits: "Let's get you around to the recliner to finish your breakfast, do you need to go the bathroom?"  ("I think I do need to try and go"--so she transferred to the [facility]) "I want to finish my breakfast" (pt sitting on 3n1), "Do you want to eat your breakfast on the potty chair or over in the recliner, remember you are sitting here trying to go to the bathroom?" ("I guess the recliner is OK"))              Pt siitting on BSC, "we  are going to give you a washcloth to wash up after you go to the bathoom"   ("ok"---gave it to her and she started washing her face v. her peri area) Problem Solving: With  getting tangled up in the IV cord with moving around.    Extremity/Trunk Assessment Right Upper Extremity Assessment RUE ROM/Strength/Tone: Within functional levels Left Upper Extremity Assessment LUE ROM/Strength/Tone: Within functional levels Right Lower Extremity Assessment RLE ROM/Strength/Tone: Within functional levels Left Lower Extremity Assessment LLE ROM/Strength/Tone: Within functional levels     Mobility Bed Mobility Details for Bed Mobility Assistance: Pt already sitting EOB upon arrival. Transfers Transfers: Sit to Stand;Stand to Sit Sit to Stand: 4: Min guard;With upper extremity assist;From bed Stand to Sit: 4: Min guard;With upper extremity assist;With armrests;To chair/3-in-1           Balance Balance Balance Assessed: Yes Dynamic Sitting Balance Dynamic Sitting - Balance Support: No upper extremity supported Dynamic Sitting - Level of Assistance: 7: Independent Dynamic Sitting - Comments: pt sitting EOB eating breakfast on presentation; flexed trunk with forward head and rounded shoulders   End of Session OT - End of Session Equipment Utilized During Treatment: Gait belt Activity Tolerance: Patient tolerated treatment well Patient left: in chair;with call bell/phone within reach Nurse Communication:  (Left O2 off due to sats in high 90's on RA)       Evette Georges [phone] 07/14/2012, 9:18 AM

## 2012-07-14 NOTE — Clinical Social Work Psychosocial (Signed)
     Clinical Social Work Department BRIEF PSYCHOSOCIAL ASSESSMENT 07/14/2012  Patient:  Rebecca [name], Rebecca [name]     Account Number:  [id number]     Admit date:  07/13/2012  Clinical Social Worker:  Tiburcio Pea  Date/Time:  07/14/2012 12:00 M  Referred by:  CSW  Date Referred:  07/14/2012 Referred for  Other - See comment   Other Referral:   FROM ALF- no return there- Home at d/c   Interview type:  Other - See comment Other interview type:   Patient and daughter    PSYCHOSOCIAL DATA Living Status:  FACILITY Admitted from facility:  [facility] [address] Level of care:  Assisted Living Primary support name:  Rebecca [name] Primary support relationship to patient:  CHILD, ADULT Degree of support available:   Strong support    CURRENT CONCERNS  Other Concerns:   Home with HH/DME    SOCIAL WORK ASSESSMENT / PLAN 72 year old female - resident of [facility]. Patient's daughter took her to her PCP yesterday and was then sent to the hospital.  Daughter does not want patient to return to [facility] and plans to take her home. Discussed need for HH/DME.  Daughter's preference is [place] and she is arranging for pt's hospital bed to be moved from the ALF into her home. Bed is provided by [facility].  Above discussed with RNCM- Rebecca [name]- HH/DME to be arranged.   Patient wants to go home with her daughter. Notified pt's nurse- Rebecca [name] of above.   Assessment/plan status:  No Further Intervention Required Other assessment/ plan:   Information/referral to community resources:   Discussed home health agencies and DME issues with daughter- then related info to [facility].  Discussed referral to other ALF's- daughter adamantly refused.  Pt had overnight stay- d/c home with daughter 07/14/12    PATIENTS/FAMILYS RESPONSE TO PLAN OF CARE: Patient is alert but very sleepy during CSW visit. States that she did not sleep well last night.  States she will return home with her daughter and will  not return to [facility]. This is confirmed by her daughter Rebecca [name]. Daughter does not wish [facility] providers to communicate with [facility]; she will go by facility and pick up patient's belongings this afternoon, then will come and pick up her mother. No further CSW needs identified.  CSW signing off.

## 2012-07-14 NOTE — Evaluation (Signed)
Physical Therapy Evaluation Patient Details Name: Rebecca [name] MRN: [id number] DOB: 09/21/1940 Today's Date: 07/14/2012 Time: 7829-5621 PT Time Calculation (min): 31 min  PT Assessment / Plan / Recommendation Clinical Impression  This 72 yo female admitted with increased wheezing and dysuria presents to acute PT with below impairments and at a Min guard---S level. Will benefit physical therapy in the acute setting to maximize independence for safety and decreased burden of care at home.     PT Assessment  Patient needs continued PT services    Follow Up Recommendations  Home health PT;Supervision/Assistance - 24 hour    Does the patient have the potential to tolerate intense rehabilitation      Barriers to Discharge   daughter's availability needs to be clarified    Equipment Recommendations  None recommended by PT    Recommendations for Other Services     Frequency Min 3X/week    Precautions / Restrictions Precautions Precautions: Fall Restrictions Weight Bearing Restrictions: No   Pertinent Vitals/Pain Denies pain, Pt remained at or above 94% on RA throughout our entire session including ambulation      Mobility  Bed Mobility Details for Bed Mobility Assistance: Pt already sitting EOB upon arrival. Transfers Sit to Stand: 4: Min guard;With upper extremity assist;From bed Stand to Sit: 4: Min guard;With upper extremity assist;With armrests;To chair/3-in-1 Ambulation/Gait Ambulation/Gait Assistance: 4: Min guard Ambulation Distance (Feet): 35 Feet Assistive device: None Ambulation/Gait Assistance Details: gaurding for stability and cues to negotiate tight spaces and IV cords (tends to get tangled with decreased awareness of cords) Gait Pattern: Wide base of support;Decreased stride length;Trunk flexed General Gait Details:  increase in lateral sway              PT Diagnosis: Abnormality of gait;Generalized weakness  PT Problem List: Decreased activity  tolerance;Decreased balance;Decreased mobility;Decreased safety awareness;Obesity;Decreased cognition PT Treatment Interventions: DME instruction;Gait training;Stair training;Functional mobility training;Therapeutic activities;Therapeutic exercise;Balance training;Patient/family education;Cognitive remediation;Neuromuscular re-education   PT Goals Acute Rehab PT Goals PT Goal Formulation: With patient Time For Goal Achievement: 07/21/12 Potential to Achieve Goals: Good Pt will go Supine/Side to Sit: with modified independence PT Goal: Supine/Side to Sit - Progress: Goal set today Pt will go Sit to Supine/Side: with modified independence PT Goal: Sit to Supine/Side - Progress: Goal set today Pt will go Sit to Stand: with modified independence PT Goal: Sit to Stand - Progress: Goal set today Pt will go Stand to Sit: with modified independence PT Goal: Stand to Sit - Progress: Goal set today Pt will Transfer Bed to Chair/Chair to Bed: with modified independence PT Transfer Goal: Bed to Chair/Chair to Bed - Progress: Goal set today Pt will Ambulate: 51 - 150 feet;with modified independence;with least restrictive assistive device PT Goal: Ambulate - Progress: Goal set today Pt will Go Up / Down Stairs: 3-5 stairs;with rail(s);with min assist PT Goal: Up/Down Stairs - Progress: Goal set today Pt will Perform Home Exercise Program: with supervision, verbal cues required/provided PT Goal: Perform Home Exercise Program - Progress: Goal set today  Visit Information  Last PT Received On: 07/14/12 Assistance Needed: +1    Subjective Data  Subjective: I just want to eat my breakfast Patient Stated Goal: home with daughter   Prior Functioning  Home Living Lives With: Alone Available Help at Discharge: Family;Available 24 hours/day Type of Home: House Home Access: Stairs to enter [employer] of Steps: 4 Entrance Stairs-Rails: Right Home Layout: Two level;Able to live on main  level with bedroom/bathroom Bathroom Shower/Tub:  Walk-in shower Bathroom Toilet: Standard Bathroom Accessibility: Yes How Accessible: Accessible via walker Home Adaptive Equipment: Walker - rolling;Shower chair with back Prior Function Level of Independence: Independent with assistive device(s) (RW) Able to Take Stairs?: Yes Driving: No Vocation: Retired Comments: was living at assisted living but per notes plans are not to return there, per patient report she will be returning to her daughters home; reports she was getting therapy at the ALF prior to admission Communication Communication: No difficulties Dominant Hand: Right    Cognition  Overall Cognitive Status: Impaired Area of Impairment: Memory;Problem solving Arousal/Alertness: Awake/alert Orientation Level: Appears intact for tasks assessed Behavior During Session: [facility] for tasks performed Memory Deficits: "Let's get you around to the recliner to finish your breakfast, do you need to go the bathroom?"  ("I think I do need to try and go"--so she transferred to the [facility]) "I want to finish my breakfast" (pt sitting on 3n1), "Do you want to eat your breakfast on the potty chair or over in the recliner, remember you are sitting here trying to go to the bathroom?" ("I guess the recliner is OK"))              Pt siitting on BSC, "we are going to give you a washcloth to wash up after you go to the bathoom"   ("ok"---gave it to her and she started washing her face v. her peri area) Problem Solving: With getting tangled up in the IV cord with moving around.    Extremity/Trunk Assessment Right Upper Extremity Assessment RUE ROM/Strength/Tone: Within functional levels Left Upper Extremity Assessment LUE ROM/Strength/Tone: Within functional levels Right Lower Extremity Assessment RLE ROM/Strength/Tone: Within functional levels Left Lower Extremity Assessment LLE ROM/Strength/Tone: Within functional levels   Balance Balance Balance  Assessed: Yes Dynamic Sitting Balance Dynamic Sitting - Balance Support: No upper extremity supported Dynamic Sitting - Level of Assistance: 7: Independent Dynamic Sitting - Comments: pt sitting EOB eating breakfast on presentation; flexed trunk with forward head and rounded shoulders  End of Session PT - End of Session Equipment Utilized During Treatment: Gait belt Activity Tolerance: Patient tolerated treatment well Patient left: in chair;with call bell/phone within reach Nurse Communication: Mobility status;Other (comment) (left pt off O2)  GP     WHITLOW,Kealy Lewter HELEN 07/14/2012, 9:54 AM

## 2012-07-14 NOTE — Care Management Note (Signed)
    Page 1 of 1   07/14/2012     2:41:49 PM   CARE MANAGEMENT NOTE 07/14/2012  Patient:  Rebecca [name], Rebecca [name]   Account Number:  [id number]  Date Initiated:  07/14/2012  Documentation initiated by:  GRAVES-BIGELOW,Jossilyn Benda  Subjective/Objective Assessment:   Pt admitted with increased wheezing and dysuria. Plan for d/c home today with family.     Action/Plan:   Family agreeable to [facility].  Referral made with [place].SOC to begin within 24-48 hours post d/c.   Anticipated DC Date:  07/14/2012   Anticipated DC Plan:  HOME W HOME HEALTH SERVICES      DC Planning Services  CM consult      [facility] Choice  HOME HEALTH   Choice offered to / List presented to:  C-4 Adult Children        HH arranged  HH-1 RN  HH-10 DISEASE MANAGEMENT  HH-2 PT      HH agency  [facility]   Status of service:  Completed, signed off Medicare Important Message given?   (If response is "NO", the following Medicare IM given date fields will be blank) Date Medicare IM given:   Date Additional Medicare IM given:    Discharge Disposition:  HOME W HOME HEALTH SERVICES  Per UR Regulation:  Reviewed for med. necessity/level of care/duration of stay  If discussed at Long Length of Stay Meetings, dates discussed:    Comments:

## 2012-07-14 NOTE — H&P (Signed)
Family Medicine Teaching Service Attending Note  I interviewed and examined patient Rebecca [name] and reviewed their tests and x-rays.  I discussed with Dr. Mikel Cella and reviewed their note for today.  I agree with their assessment and plan.     Additionally  Feels well no shortness of breath or chest pain but is stuffy Alert or x 3 Able to walk to door and return to chair without assistance and pox in upper 90s on RA No wheeze but upper airway congestion  Would start back on low dose steroids 15-20 mg and very slow taper Doubt UTI given UA.  Would stop antibiotics and follow up on culture Placement at per patient and family wishes

## 2012-07-14 NOTE — Progress Notes (Signed)
Utilization review completed.  P.J. Jolaine Fryberger,RN,BSN Case Manager [phone]  

## 2012-07-14 NOTE — Clinical Documentation Improvement (Signed)
CHF DOCUMENTATION CLARIFICATION QUERY  THIS DOCUMENT IS NOT A PERMANENT PART OF THE MEDICAL RECORD   Please update your documentation within the medical record to reflect your response to this query.                                                                                     07/14/12  Dr. Casper Harrison and/or Associates,   (there are 2 queries on this form)  Query # 1 In a better effort to capture your patient's severity of illness, reflect appropriate length of stay and utilization of resources, a review of the patient medical record has revealed the following indicators:  "CHF with preserved EF (last Echo 2012)" History of HTN Jamonte Curfman, [place], MD  07/13/2012, 11:43 PM H&P  06/01/2011  Echo Left Ventricular Cavity is small Mild Asymmetric Left Ventricular Hypertrophy Left Ventricular Systolic Function Normal Transmital Spectral Doppler Flow Pattern suggestive of Impaired Left Ventricular Relaxation Borderline Left Atrial Enlargement Trace Mitral Regurgitation Trace Tricuspid Regurgitation Trace Pulmonic Regurgitation  Dr. Julieanne Manson   Based on your clinical judgment, please document in the progress notes and discharge summary if a condition below provides greater specificity regarding the patient's CHF with preserved EF:   - XXX Chronic Diastolic Heart Failure  - Other Acuity and Type  - Other Condition  - Unable to Clinically Determine   Query # 2 In a better effort to capture your patient's severity of illness, reflect appropriate length of stay and utilization of resources, a review of the patient medical record has revealed the following indicators:  "chronic renal insufficiency with related anemia" "Pt seen as an outpt by Dr. Kathrene Bongo" -careful IVF, then likely saline lock once ensured PO intake  -will monitor renal function daily Ahmari Duerson, [place], MD  07/13/2012, 11:43 PM H&P  Review of CHL - GFR (WF) ranges in the 20's to 40's since  2012.  History of HTN and Diabetes Mellitus Type 2 Diet Controlled  Based on your clinical judgment, please document in the progress notes and discharge summary if a condition below provides greater specificity regarding the patient's chronic renal insufficiency:    CKD Stage I -  GFR ?90   CKD Stage II - GFR 60-89   CKD Stage III - GFR 30-59   XXX CKD Stage IV - GFR 15-29   CKD Stage V - GFR < 15    Other Condition    Unable to Clinically Determine    In responding to this query please exercise your independent judgment.    The fact that a query is asked, does not imply that any particular answer is desired or expected.   Reviewed: additional documentation in the medical record will be added to D/C summary for clarification (CKD stage IV, chronic diastolic heart failure)

## 2012-07-14 NOTE — Progress Notes (Signed)
FMTS Attending Daily Note:  Renold Don MD  [phone] pager  Family Practice pager:  [phone] I have discussed this patient with the resident Dr. Casper Harrison and attending physician Dr. Deirdre Priest.  I agree with their findings, assessment, and care plan

## 2012-07-15 ENCOUNTER — Other Ambulatory Visit: Payer: Self-pay | Admitting: Sports Medicine

## 2012-07-15 DIAGNOSIS — I5032 Chronic diastolic (congestive) heart failure: Secondary | ICD-10-CM | POA: Diagnosis present

## 2012-07-15 LAB — GLUCOSE, CAPILLARY: Glucose-Capillary: 158 mg/dL — ABNORMAL HIGH (ref 70–99)

## 2012-07-15 MED ORDER — LEVOFLOXACIN 750 MG PO TABS: 750.0000 mg | ORAL_TABLET | Freq: Every day | ORAL | Status: DC

## 2012-07-15 MED ORDER — FLUTICASONE-SALMETEROL 250-50 MCG/DOSE IN AEPB: 1.0000 | INHALATION_SPRAY | Freq: Two times a day (BID) | RESPIRATORY_TRACT | Status: DC

## 2012-07-15 MED ORDER — PREDNISONE 10 MG PO TABS: ORAL_TABLET | ORAL | Status: DC

## 2012-07-15 MED ORDER — PREDNISONE 20 MG PO TABS
20.0000 mg | ORAL_TABLET | Freq: Once | ORAL | Status: AC
  Administered 2012-07-15: 20 mg via ORAL
  Filled 2012-07-15: qty 1

## 2012-07-15 MED ORDER — LEVOFLOXACIN 750 MG PO TABS: 750.0000 mg | ORAL_TABLET | ORAL | Status: DC

## 2012-07-15 MED ORDER — ACCU-CHEK AVIVA PLUS W/DEVICE KIT: 1.0000 | PACK | Freq: Three times a day (TID) | Status: DC

## 2012-07-15 MED ORDER — LEVOFLOXACIN 750 MG PO TABS
750.0000 mg | ORAL_TABLET | ORAL | Status: DC
  Filled 2012-07-15: qty 1

## 2012-07-15 MED ORDER — MOMETASONE FURO-FORMOTEROL FUM 100-5 MCG/ACT IN AERO
2.0000 | INHALATION_SPRAY | Freq: Two times a day (BID) | RESPIRATORY_TRACT | Status: DC
  Administered 2012-07-15: 2 via RESPIRATORY_TRACT
  Filled 2012-07-15 (×2): qty 8.8

## 2012-07-15 NOTE — Progress Notes (Signed)
Daughter verbalized understanding of discharge instructions.  Daughter spoke with Dr. Casper Harrison in detail about her concerns about discharge which she stated," were addressed."  Informed she could call me for assistance until 1900.

## 2012-07-15 NOTE — Telephone Encounter (Signed)
Received call that Nursing facility had misplaced accu chek meter. (Accu-Chek Aviva Plus) New Rx sent to [facility] on [address]

## 2012-07-15 NOTE — Discharge Summary (Signed)
Family Medicine Teaching [facility] Discharge Summary  Patient name: Rebecca [name] Rebecca [name] Medical record number: [id number] Date of birth: [birth year] Age: 72 y.o. Gender: female Date of Admission: 07/13/2012  Date of Discharge: 07/15/2012 Admitting Physician: Carney Living, MD  Primary Care Provider: Delbert Harness, MD  Indication for Hospitalization: increased wheezing/dyspnea, dysuria Discharge Diagnoses:  COPD with acute exacerbation DM type II, diet-controlled Chronic diastolic congestive heart failure, with preserved EF HTN HLD CAD Chronic renal insufficiency, CKD stage IV Chronic normocytic anemia Schizoaffective disorder GERD Hypothyroidism Low back pain, chronic; arthritis Chronic constipation  Consultations: none  Significant Labs and Imaging:  Lab  07/14/12 0120  07/13/12 1738   WBC  8.8  9.6   HGB  9.3*  9.9*   HCT  27.2*  28.8*   PLT  131*  147*     Lab  07/14/12 0120  07/13/12 1738   NA  133*  134*   K  4.1  5.2*   CL  97  96   CO2  25  26   BUN  29*  31*   CREATININE  1.78*  1.88*   CALCIUM  8.4  9.1   PROT  6.1  6.4   BILITOT  0.2*  0.3   ALKPHOS  32*  38*   ALT  23  25   AST  15  18   GLUCOSE  191*  189*    Pro-BNP, 1/30 @1755 : 493.1  Troponin (POC), 1/30 @ 2108: negative   07/13/2012 18:13   Color, Urine  YELLOW   APPearance  CLEAR   Specific Gravity, Urine  1.030   pH  6.0   Glucose  NEGATIVE   Bilirubin Urine  NEGATIVE   Ketones, ur  NEGATIVE   Protein  100 (A)   Urobilinogen, UA  0.2   Nitrite  NEGATIVE   Leukocytes, UA  TRACE (A)   Hgb urine dipstick  TRACE (A)   Urine-Other  LESS THAN 10 mL OF URINE SUBMITTED   WBC, UA  3-6   RBC / HPF  0-2   Squamous Epithelial / LPF  FEW (A)   Bacteria, UA  RARE   Microbiology:   Blood cultures, 1/30 @2216  and @2230 : pending   Urine culture, 1/30 @1813 : pending   Imaging/Diagnostic Tests:  CXR, 1/30 @1811   Findings: The cardiomediastinal silhouette is unremarkable.  COPD/emphysema is  noted.  Left basilar scarring is again identified.  There is no evidence of focal airspace disease, pulmonary edema,  suspicious pulmonary nodule/mass, pleural effusion, or  pneumothorax.  No acute bony abnormalities are identified.  Degenerative changes within the shoulders again identified.  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  EKG, 1/30 @1706 : NSR without evidence of ischemia  Procedures: none  Brief Hospital Course: Rebecca [name] is a 72 y.o. year old female with PMH significant for COPD, DM type II (diet-controlled), chronic renal insufficiency with related anemia, CAD s/p stent, CHF with preserved EF (last echo 05/2011), HTN, HLD, hypothyroidism, chronic constipation and schizoaffective disorder (on Clozaril) presenting with several days of worsening wheezing/coughing most likely representing mild COPD exacerbation. Also with several days of dysuria/UTI-type symptoms but no great frank evidence of infection on labs or UA. EKG unchanged and CXR not suspicious for pneumonia. No elevated WBC but pt with fever to 100.9 in the ED. Recent unintentional long course of prednisone 50 mg daily (10 weeks) and recent rapid taper (see HPI), likely contributing to some rebound symptoms. Admited to  FPTS 1/30. At time of discharge, pt has remained afebrile and does not have increased O2 requirement. Controller medication for COPD to be started (Advair), as well as long/slow steroid taper and brief course of abx. See below by problem list for details.  #Dyspnea with hx of COPD - acute exacerbation +/- rebound symptoms from recent rapid steroid taper (see H&P/HPI)  -DDx could also include PNA, influenza, component of CHF (see below); PE or MI very unlikely  -Empiric abx started in the ED   -Rocephin IV and azithromycin PO   -transitioned to Levaquin PO on 1/31 -flu panel by PCR negative, empiric Tamiflu started --> D/C  -normally would treat with systemic steroids  for acute exacerbation  -given rapid prednisone taper and duration of previous therapy, pt likely will need longer taper   -likely some component of adrenal insufficiency and rebound symptoms contributing to current status   -2/1: started prednisone 20 mg daily with planned long, slow taper off over 4 or more weeks  -otherwise continue Duonebs q6 while awake, with q2 nebulizers PRN; O2 via [place] as needed, IS while awake  -guaifenisen for congestion  -pt does not use O2 at home; may have undiagnosed/subclinical OSA-type sx  -pt ambulated on RA without desaturation  #Dysuria/Chronic renal impairment with related anemia - dysuria symptoms appear acute-on-chronic  -K mildly elevated on admit, 5.2; admission Cr 1.8  -?baseline ~1.5; many varying readings in old records; CKD likely stage IV by GFR ~20-30  -K 4.1 and Cr 1.78 on 1/31  -UA and not frankly concerning for UTI, but urine and blood cultures culture pending   -started on Rocephin/azithromycin as above  -transitioned to Levaquin PO on 1/31, to continue after discharge   -Pt seen as an outpt by Dr. Kathrene Bongo, has been seen by urology in the past; no current need for consult  -Hb stable, basline ~9-10; continued home iron  -initially on gentle IVF --> pt put to saline lock IV with good PO intake on 1/31  #Cardiac: HTN, chronic diastolic CHF with preserved EF (echo 05/2011), hx of CAD s/p stent, HLD  -BP's reasonable on home meds, ~140's/60's  -no current chest pain, EKG shows NSR without new findings  -POC troponin negative x1, regular troponin-I also negative  -continued daily ASA, statin (Zocor substituted for home Pravachol)  -continued home meds: Toprol-XL 100 mg daily, Imdur 24h tablet 60 mg daily, Cardizem CD 180 mg daily  -BNP mildly elevated (vs age-related normal, and pt also with renal insufficieny ); no previous level found in EPIC   -pt did not appear clinically greatly volume-overloaded and does not have daily diuretic meds  at home   -considered repeat echo during this admission, but pt did not require any diuresis   #DM - type II, diet-controlled at home. Last A1c ~6 in November. CBG's ~100-200.  -new A1c not checked; likely to be elevated given prolonged steroid course  -pt managed with CBG's qAC+HS and covered with SSI as needed (total of 9 units Novolog throughout stay) -do not anticipate need to start new medication   #Hypothyroidism - on Synthroid at home  -continued Synthroid  -TSH wnl   #Schizoaffective disorder/insomnia - Currently on Clozaril. Recently seen by outpt psychiatrist Dr. Donell Beers  -continued Clozaril daily  -continued Restoril for sleep, PRN   #Chronic constipation/GERD - No current complaints, other than some possible mild heartburn component of abd tenderness  -continued Miralax, Colace, Protonix (home regimen)  Discharge Exam: BP 133/50  Pulse 86  Temp  99.1 F (37.3 C) (Oral)  Resp 20  Ht 5\' 3"  (1.6 m)  Wt 192 lb 10.9 oz (87.4 kg)  BMI 34.13 kg/m2  SpO2 94% General: sitting up at bedside, speaking in full sentences, comfortable on RA Cardiovascular: RRR, no murmur appreciated  Respiratory: diffuse loud wheezes throughout with good bilateral air movement, pt not distressed  Abdomen: soft, nontender, nondistended; obese, BS+  Extremities: moves all extremities equally/spontaneously, distal pulses intact/symmetric  Discharge Medications:    Medication List     As of 07/15/2012 10:08 AM    TAKE these medications         acetaminophen 325 MG tablet   Commonly known as: TYLENOL   Take 650 mg by mouth every 8 (eight) hours as needed. For pain      albuterol 108 (90 BASE) MCG/ACT inhaler   Commonly known as: PROVENTIL HFA;VENTOLIN HFA   Inhale 1-2 puffs into the lungs every 4 (four) hours as needed. For shortness of breath      aspirin EC 81 MG tablet   Take 162 mg by mouth daily.      Calcium Carbonate-Vitamin D 600-400 MG-UNIT per tablet   Take 1 tablet by mouth  daily.      cloZAPine 100 MG tablet   Commonly known as: CLOZARIL   Take 150 mg by mouth at bedtime.      COLACE 100 MG capsule   Generic drug: docusate sodium   Take 1 capsule (100 mg total) by mouth 2 (two) times daily.      Cranberry 500 MG Caps   Take 500 mg by mouth daily.      diltiazem 180 MG 24 hr capsule   Commonly known as: CARDIZEM CD   Take 180 mg by mouth daily.      ferrous sulfate 325 (65 FE) MG EC tablet   Take 325 mg by mouth 2 (two) times daily.      Fish Oil 1000 MG Caps   Take 1 capsule (1,000 mg total) by mouth daily.      Fluticasone-Salmeterol 250-50 MCG/DOSE Aepb   Commonly known as: ADVAIR   Inhale 1 puff into the lungs 2 (two) times daily.      guaiFENesin 600 MG 12 hr tablet   Commonly known as: MUCINEX   Take 1 tablet (600 mg total) by mouth 2 (two) times daily.      isosorbide mononitrate 60 MG 24 hr tablet   Commonly known as: IMDUR   Take 60 mg by mouth daily.      Lancets Misc   For checking cbg once daily      Lancets Misc   Check glucose once daily      levofloxacin 750 MG tablet   Commonly known as: LEVAQUIN   Take 1 tablet (750 mg total) by mouth every other day.      levothyroxine 50 MCG tablet   Commonly known as: SYNTHROID, LEVOTHROID   Take 50 mcg by mouth daily.      metoprolol succinate 100 MG 24 hr tablet   Commonly known as: TOPROL-XL   Take 100 mg by mouth daily. Take with or immediately following a meal.      multivitamin with minerals Tabs   Take 1 tablet by mouth daily. [employer]      nitroGLYCERIN 0.4 MG SL tablet   Commonly known as: NITROSTAT   Place 0.4 mg under the tongue every 5 (five) minutes as needed. For chest pain  pantoprazole 20 MG tablet   Commonly known as: PROTONIX   Take 20 mg by mouth daily.      polyethylene glycol packet   Commonly known as: MIRALAX / GLYCOLAX   Take 17 g by mouth daily as needed. For constipation:  Mix with 4-8 oz of fluid and drink      pravastatin 20  MG tablet   Commonly known as: PRAVACHOL   Take 20 mg by mouth at bedtime.      predniSONE 10 MG tablet   Commonly known as: DELTASONE   Take 20 mg (2 tabs) daily for 4 days (first on 2/2). Then 15 mg (1.5 tabs) for 5 days, 10 mg (1 tab) for 5, and 5 mg (0.5 tab) for 5.      temazepam 15 MG capsule   Commonly known as: RESTORIL   Take 15 mg by mouth at bedtime.      traMADol 50 MG tablet   Commonly known as: ULTRAM   Take 1 tablet (50 mg total) by mouth 2 (two) times daily as needed. For severe pain      TRUETEST TEST test strip   Generic drug: glucose blood   TEST BLOOD GLUCOSE ONCE A DAY       Disposition: discharge home to care of daughter; home health PT and RN  Issues for Follow Up:  1. COPD/steroid taper - Some improvement of symptoms throughout hospital stay, though still with diffuse wheeze at discharge, sats >95% on room air. Pt recently weaned rapidly from steroids, as above; decision made to do a longer taper; pt was started on prednisone 20 mg daily on 2/1, with instructions to take 20 mg for 5 days, then 15, 10, and 5 mg each for 5 days, then stop. Pt was also instructed to start Advair (also provided with Dulera inhaler at discharge due to formulary at hospital); instructed to use ONE OR THE OTHER, NOT BOTH. Pt discharged with short course of Levaquin for COPD exacerbation and to cover possible UTI, as well (see below). Pt likely has undiagnosed/subclinical OSA; could consider referral to pulmonology for sleep study. She does not use O2 or CPAP at home.  2. Cardiac history - Pt was not started on new medications and had a normal EKG this admission, without any complaints of chest pain. Would continue current medications.  3. DM - Pt has previously been diet-controlled. With recent very long course of steroids, would expect A1c to be elevated (not checked during this admission). Pt was covered with minimal Novolog SSI during hospitalization.  4. Culture results - final  urine culture results are pending. Pt discharged to complete course of Levaquin; if speciation of urine culture is not-susceptible, would change to appropriate agent.  Outstanding Results: final urine and culture results  Discharge Instructions: Please refer to Patient Instructions section of EMR for full details.  Patient was counseled important signs and symptoms that should prompt return to medical care, changes in medications, dietary instructions, activity restrictions, and follow up appointments.       Follow-up Information    Follow up with Delbert Harness, MD. On 07/18/2012. (Appt time at 1:45 PM.)    Contact information:   [address] [place] [address] [phone]         Discharge Condition: stable  [address], [place], MD 07/15/2012, 10:08 AM

## 2012-07-15 NOTE — Discharge Summary (Signed)
Family Medicine Teaching Service  Discharge Note : Attending Jeff Suan Pyeatt MD Pager [phone] Inpatient Team Pager:  [phone]  I have seen and examined this patient, reviewed their chart and discussed discharge planning with the resident at the time of discharge. I agree with the discharge plan as above.  

## 2012-07-16 LAB — URINE CULTURE: Colony Count: 75000

## 2012-07-18 ENCOUNTER — Encounter: Payer: Self-pay | Admitting: Family Medicine

## 2012-07-18 ENCOUNTER — Telehealth: Payer: Self-pay | Admitting: Family Medicine

## 2012-07-18 ENCOUNTER — Ambulatory Visit (INDEPENDENT_AMBULATORY_CARE_PROVIDER_SITE_OTHER): Payer: Medicare Other | Admitting: Family Medicine

## 2012-07-18 VITALS — BP 126/75 | HR 87 | Temp 97.9°F | Ht 63.0 in | Wt 184.0 lb

## 2012-07-18 DIAGNOSIS — R3989 Other symptoms and signs involving the genitourinary system: Secondary | ICD-10-CM

## 2012-07-18 DIAGNOSIS — J449 Chronic obstructive pulmonary disease, unspecified: Secondary | ICD-10-CM

## 2012-07-18 DIAGNOSIS — R399 Unspecified symptoms and signs involving the genitourinary system: Secondary | ICD-10-CM

## 2012-07-18 DIAGNOSIS — J4489 Other specified chronic obstructive pulmonary disease: Secondary | ICD-10-CM

## 2012-07-18 MED ORDER — PRAVASTATIN SODIUM 20 MG PO TABS: 20.0000 mg | ORAL_TABLET | Freq: Every day | ORAL | Status: DC

## 2012-07-18 MED ORDER — METOPROLOL SUCCINATE ER 100 MG PO TB24: 100.0000 mg | ORAL_TABLET | Freq: Every day | ORAL | Status: DC

## 2012-07-18 MED ORDER — PANTOPRAZOLE SODIUM 20 MG PO TBEC: 20.0000 mg | DELAYED_RELEASE_TABLET | Freq: Every day | ORAL | Status: DC

## 2012-07-18 MED ORDER — TRAMADOL HCL 50 MG PO TABS: 50.0000 mg | ORAL_TABLET | Freq: Two times a day (BID) | ORAL | Status: DC | PRN

## 2012-07-18 MED ORDER — ALBUTEROL SULFATE HFA 108 (90 BASE) MCG/ACT IN AERS: 1.0000 | INHALATION_SPRAY | RESPIRATORY_TRACT | Status: DC | PRN

## 2012-07-18 MED ORDER — ISOSORBIDE MONONITRATE ER 60 MG PO TB24: 60.0000 mg | ORAL_TABLET | Freq: Every day | ORAL | Status: DC

## 2012-07-18 MED ORDER — LEVOTHYROXINE SODIUM 50 MCG PO TABS: 50.0000 ug | ORAL_TABLET | Freq: Every day | ORAL | Status: DC

## 2012-07-18 MED ORDER — DILTIAZEM HCL ER COATED BEADS 180 MG PO CP24: 180.0000 mg | ORAL_CAPSULE | Freq: Every day | ORAL | Status: DC

## 2012-07-18 MED ORDER — POLYETHYLENE GLYCOL 3350 17 GM/SCOOP PO POWD: 17.0000 g | Freq: Two times a day (BID) | ORAL | Status: DC | PRN

## 2012-07-18 MED ORDER — NITROGLYCERIN 0.4 MG SL SUBL: 0.4000 mg | SUBLINGUAL_TABLET | SUBLINGUAL | Status: DC | PRN

## 2012-07-18 NOTE — Telephone Encounter (Signed)
Home Health services were ordered but patient requested that the [facility] be done either Thursday or Friday this week.

## 2012-07-18 NOTE — Patient Instructions (Signed)
I am glad you are doing better.  Follow-up in 3-4 months or sooner if needed.

## 2012-07-18 NOTE — Assessment & Plan Note (Signed)
Finishing treatment on levaquin for e coli urine culture from hospitalization

## 2012-07-18 NOTE — Assessment & Plan Note (Signed)
Symptoms have been mild, now starting on advair.  If has further trouble with breathing would consider pulm referral given history of restrictive disease seen on PFT which had previously been minimally symptomatic.  Chest XR consistent with COPD

## 2012-07-18 NOTE — Progress Notes (Signed)
  Subjective:    Patient ID: Rebecca [name], female    DOB: 20-Jul-1940, 72 y.o.   MRN: [id number]  HPI 72 yo here for hospital follow-up  Was admitted for COPD exacerbation, no evidence of pneumonia.  Was in setting of tapering off chronic steroids which were unintended at the nursing facility.  Was also started on advair  Was discharged on steroid taper and levaquin, which is also covering for E. Coli in urine culture.  Blood culture negative from hospitalization.  Overall breathing improved.  No more dysuria.  Is now living with her daughter and taht transition has gone well.  Will have home health services as ordered by hospital discharge.   Review of Systems See HPI    Objective:   Physical Exam GEN: Alert & Oriented, No acute distress CV:  Regular Rate & Rhythm, no murmur Respiratory:  Normal work of breathing, coarse breath sounds, occaisional wheeze Abd:  + BS, soft, no tenderness to palpation Ext: no pre-tibial edema        Assessment & Plan:

## 2012-07-20 ENCOUNTER — Telehealth: Payer: Self-pay | Admitting: Family Medicine

## 2012-07-20 LAB — CULTURE, BLOOD (ROUTINE X 2)
Culture: NO GROWTH
Culture: NO GROWTH

## 2012-07-20 NOTE — Telephone Encounter (Signed)
Spoke with daughter and she states today while sitting at table after she had finished with her lunch patient complained with discomfort  in upper back between shoulder blade and pain up back of neck.  Also complained of dizziness. Stated she just needed to get to her chair to lay down. Walked to chair and when she sat down daughter noted that her face was clammy.  She gave her 2 baby aspirin and a tylenol . Now patient is feeling better. Denies any chest pain now or previously.  Consulted with Dr. Earnest Bailey and she advises to just watch for now and if symptoms recur or worsen needs to be seen or go to ED.

## 2012-07-20 NOTE — Telephone Encounter (Signed)
Patient daughter states Ms Varady is feeling dizzy and a slight headache. Would like to know if patient should be seen.

## 2012-07-21 ENCOUNTER — Telehealth: Payer: Self-pay | Admitting: Family Medicine

## 2012-07-21 DIAGNOSIS — Z79899 Other long term (current) drug therapy: Secondary | ICD-10-CM | POA: Diagnosis not present

## 2012-07-21 DIAGNOSIS — F259 Schizoaffective disorder, unspecified: Secondary | ICD-10-CM | POA: Diagnosis not present

## 2012-07-21 DIAGNOSIS — E119 Type 2 diabetes mellitus without complications: Secondary | ICD-10-CM | POA: Diagnosis not present

## 2012-07-21 DIAGNOSIS — J441 Chronic obstructive pulmonary disease with (acute) exacerbation: Secondary | ICD-10-CM | POA: Diagnosis not present

## 2012-07-21 DIAGNOSIS — N189 Chronic kidney disease, unspecified: Secondary | ICD-10-CM | POA: Diagnosis not present

## 2012-07-21 DIAGNOSIS — I129 Hypertensive chronic kidney disease with stage 1 through stage 4 chronic kidney disease, or unspecified chronic kidney disease: Secondary | ICD-10-CM | POA: Diagnosis not present

## 2012-07-21 NOTE — Telephone Encounter (Signed)
Nurse from [facility] calling to let Dr. Earnest Bailey know that patient was admitted to home care for diabetic, respiratory and med management.

## 2012-07-21 NOTE — Telephone Encounter (Signed)
Thanks

## 2012-07-24 DIAGNOSIS — F259 Schizoaffective disorder, unspecified: Secondary | ICD-10-CM | POA: Diagnosis not present

## 2012-07-24 DIAGNOSIS — I129 Hypertensive chronic kidney disease with stage 1 through stage 4 chronic kidney disease, or unspecified chronic kidney disease: Secondary | ICD-10-CM | POA: Diagnosis not present

## 2012-07-24 DIAGNOSIS — E119 Type 2 diabetes mellitus without complications: Secondary | ICD-10-CM | POA: Diagnosis not present

## 2012-07-24 DIAGNOSIS — J441 Chronic obstructive pulmonary disease with (acute) exacerbation: Secondary | ICD-10-CM | POA: Diagnosis not present

## 2012-07-24 DIAGNOSIS — N189 Chronic kidney disease, unspecified: Secondary | ICD-10-CM | POA: Diagnosis not present

## 2012-08-02 DIAGNOSIS — N390 Urinary tract infection, site not specified: Secondary | ICD-10-CM | POA: Diagnosis not present

## 2012-08-02 DIAGNOSIS — N318 Other neuromuscular dysfunction of bladder: Secondary | ICD-10-CM | POA: Diagnosis not present

## 2012-08-02 DIAGNOSIS — I129 Hypertensive chronic kidney disease with stage 1 through stage 4 chronic kidney disease, or unspecified chronic kidney disease: Secondary | ICD-10-CM | POA: Diagnosis not present

## 2012-08-02 DIAGNOSIS — N183 Chronic kidney disease, stage 3 unspecified: Secondary | ICD-10-CM | POA: Diagnosis not present

## 2012-08-03 ENCOUNTER — Other Ambulatory Visit (HOSPITAL_COMMUNITY): Payer: Self-pay | Admitting: Cardiovascular Disease

## 2012-08-03 DIAGNOSIS — E119 Type 2 diabetes mellitus without complications: Secondary | ICD-10-CM | POA: Diagnosis not present

## 2012-08-03 DIAGNOSIS — J449 Chronic obstructive pulmonary disease, unspecified: Secondary | ICD-10-CM | POA: Diagnosis not present

## 2012-08-03 DIAGNOSIS — E782 Mixed hyperlipidemia: Secondary | ICD-10-CM | POA: Diagnosis not present

## 2012-08-03 DIAGNOSIS — D649 Anemia, unspecified: Secondary | ICD-10-CM | POA: Diagnosis not present

## 2012-08-03 DIAGNOSIS — R6889 Other general symptoms and signs: Secondary | ICD-10-CM | POA: Diagnosis not present

## 2012-08-03 DIAGNOSIS — I2510 Atherosclerotic heart disease of native coronary artery without angina pectoris: Secondary | ICD-10-CM | POA: Diagnosis not present

## 2012-08-03 DIAGNOSIS — R0602 Shortness of breath: Secondary | ICD-10-CM | POA: Diagnosis not present

## 2012-08-04 DIAGNOSIS — F200 Paranoid schizophrenia: Secondary | ICD-10-CM | POA: Diagnosis not present

## 2012-08-07 ENCOUNTER — Telehealth: Payer: Self-pay | Admitting: Family Medicine

## 2012-08-07 NOTE — Telephone Encounter (Signed)
To MD. Fleeger, Jessica Dawn  

## 2012-08-07 NOTE — Telephone Encounter (Signed)
Wanted to let her know that they have not started home therapy - daughter wanted to do some more tests (tomorrow) before they go out there - might have to wait until next week

## 2012-08-08 ENCOUNTER — Ambulatory Visit (HOSPITAL_COMMUNITY)
Admission: RE | Admit: 2012-08-08 | Discharge: 2012-08-08 | Disposition: A | Discharge status: Home / Self Care | Payer: Medicare Other | Source: Ambulatory Visit | Attending: Cardiovascular Disease | Admitting: Cardiovascular Disease

## 2012-08-08 DIAGNOSIS — E669 Obesity, unspecified: Secondary | ICD-10-CM | POA: Diagnosis not present

## 2012-08-08 DIAGNOSIS — I079 Rheumatic tricuspid valve disease, unspecified: Secondary | ICD-10-CM | POA: Diagnosis not present

## 2012-08-08 DIAGNOSIS — R9431 Abnormal electrocardiogram [ECG] [EKG]: Secondary | ICD-10-CM | POA: Diagnosis not present

## 2012-08-08 DIAGNOSIS — I428 Other cardiomyopathies: Secondary | ICD-10-CM

## 2012-08-08 DIAGNOSIS — I509 Heart failure, unspecified: Secondary | ICD-10-CM

## 2012-08-08 DIAGNOSIS — E785 Hyperlipidemia, unspecified: Secondary | ICD-10-CM | POA: Insufficient documentation

## 2012-08-08 DIAGNOSIS — I059 Rheumatic mitral valve disease, unspecified: Secondary | ICD-10-CM | POA: Insufficient documentation

## 2012-08-08 DIAGNOSIS — I5030 Unspecified diastolic (congestive) heart failure: Secondary | ICD-10-CM | POA: Diagnosis not present

## 2012-08-08 DIAGNOSIS — N19 Unspecified kidney failure: Secondary | ICD-10-CM | POA: Insufficient documentation

## 2012-08-08 DIAGNOSIS — I2510 Atherosclerotic heart disease of native coronary artery without angina pectoris: Secondary | ICD-10-CM | POA: Diagnosis not present

## 2012-08-08 DIAGNOSIS — R011 Cardiac murmur, unspecified: Secondary | ICD-10-CM | POA: Diagnosis not present

## 2012-08-08 DIAGNOSIS — I519 Heart disease, unspecified: Secondary | ICD-10-CM

## 2012-08-08 NOTE — Progress Notes (Signed)
[facility]   2D echo completed 08/08/2012.   Cindy Yonatan Guitron, RDCS  

## 2012-08-10 IMAGING — CR DG ABDOMEN ACUTE W/ 1V CHEST
1 series · 1 of 1 positions shown · non-contrast
Comparison: Chest radiograph 09/03/2009, abdominal radiographs
02/14/2010

CLINICAL DATA: Nausea, vomiting, abdominal pain,

ACUTE ABDOMEN SERIES (ABDOMEN 2 VIEW & CHEST 1 VIEW)

[view not recorded]
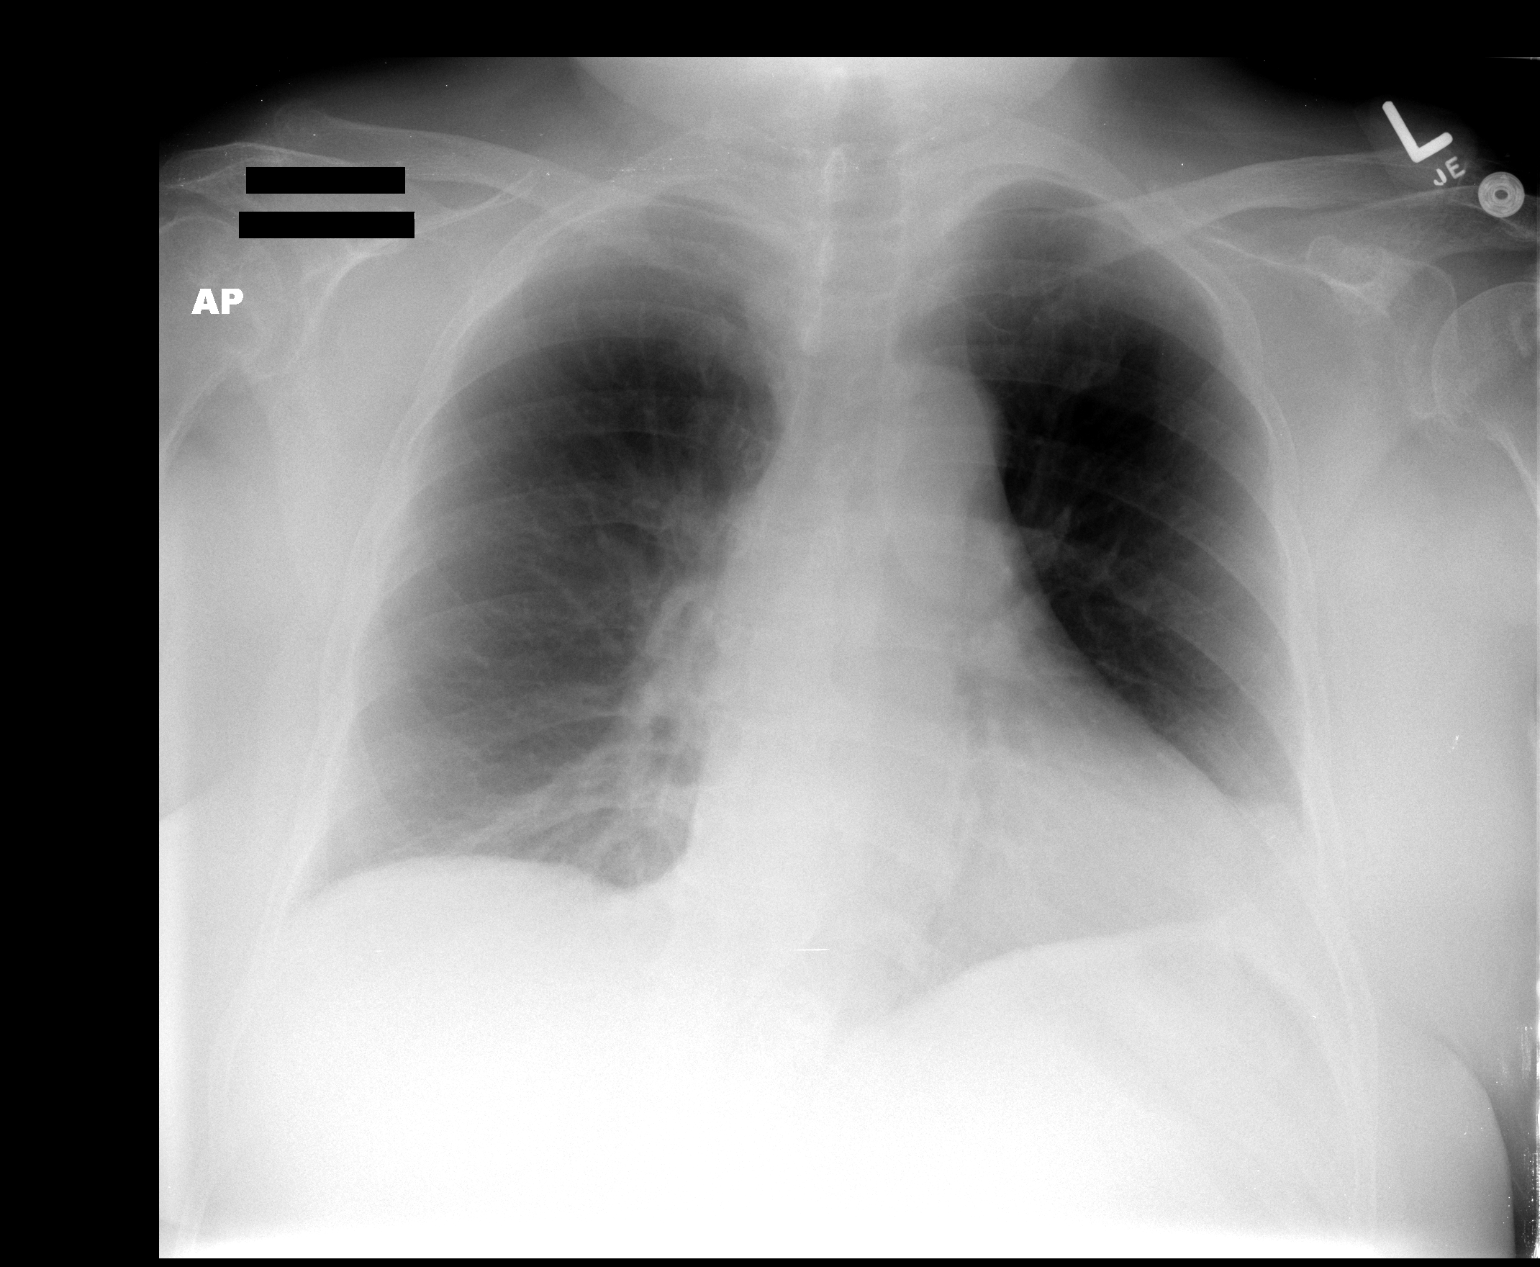

[1 of 1 positions shown; findings below may reference images not displayed]

FINDINGS: Mild enlargement of cardiac silhouette.
Mediastinal contours and pulmonary vascularity normal.
Atelectasis at right base.
Lungs otherwise clear.
No gross effusion or pneumothorax.
Numerous dilated loops of small bowel in the abdomen with relative
paucity of colonic gas most consistent with small bowel
obstruction.
Multiple small bowel air-fluid levels on upright view.
Small amount gas present rectum.
No definite bowel wall thickening or free intraperitoneal air.
Surgical clips right upper quadrant question cholecystectomy.
No urinary tract calcification.
IMPRESSION: Dilated small bowel loops out of proportion to the amount of
colonic gas, suspicious for small bowel obstruction.
Mild enlargement of cardiac silhouette.

## 2012-08-10 IMAGING — CT CT ABD-PELV W/O CM
2 of 4 series · 17 of 46 positions shown, 19 images · non-contrast
Comparison: 09/01/2009

CLINICAL DATA: Abdominal distention.  Nausea and vomiting.  Urinary
retention.

CT ABDOMEN AND PELVIS WITHOUT CONTRAST
TECHNIQUE: Multidetector CT imaging of the abdomen and pelvis was
performed following the standard protocol without intravenous
contrast.

[Series 2: routine · axial · 0.87mm/px · z∈[-483,-38]mm · 14 of 97 slices shown, 16 images]
[im 4/97  soft-tissue]
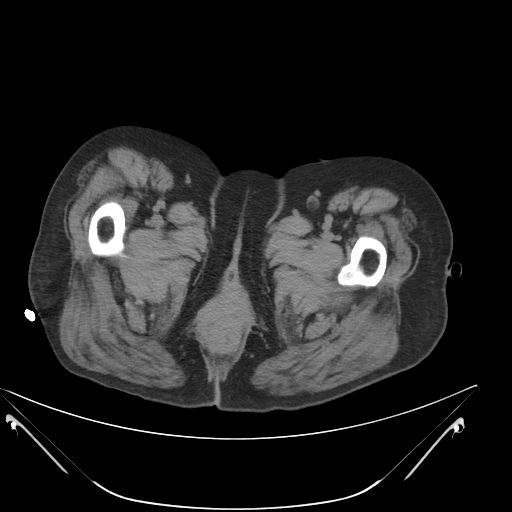
[im 4/97  bone]
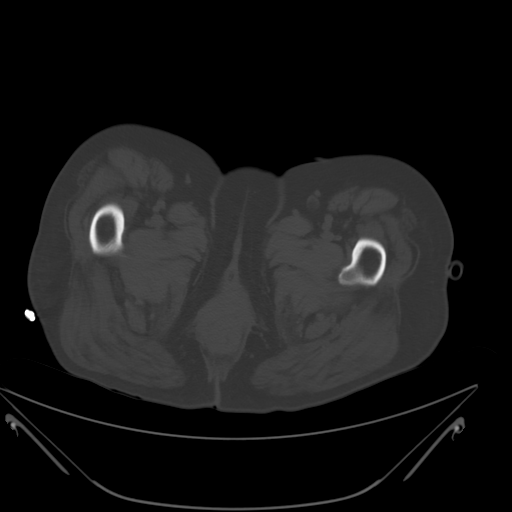
[im 12/97  soft-tissue]
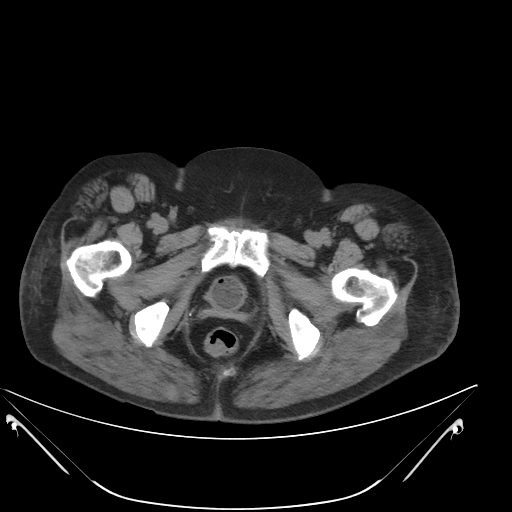
[im 20/97  soft-tissue]
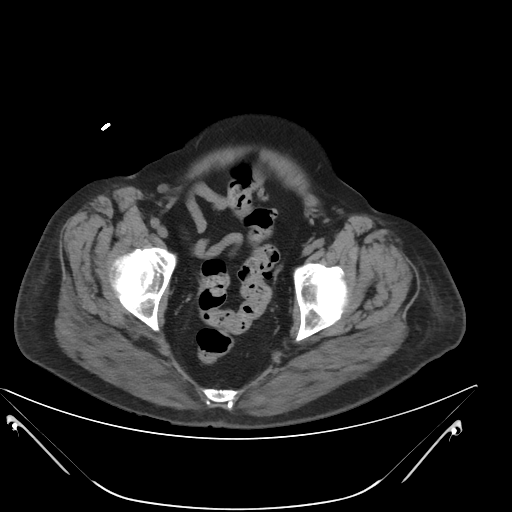
[im 27/97  soft-tissue]
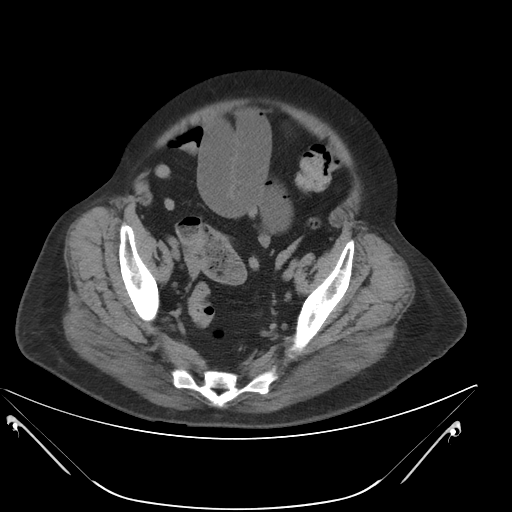
[im 31/97  soft-tissue]
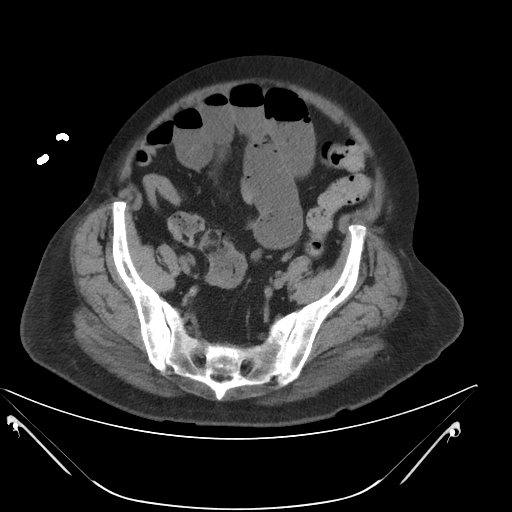
[im 39/97  soft-tissue]
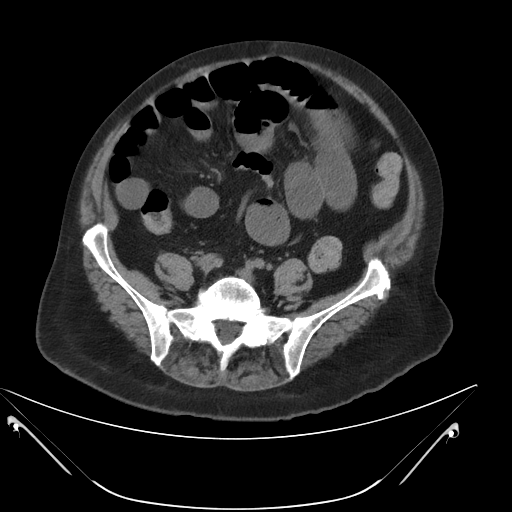
[im 47/97  soft-tissue]
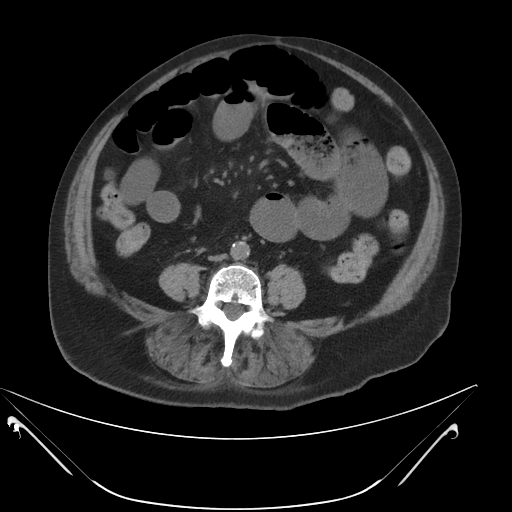
[im 50/97  soft-tissue]
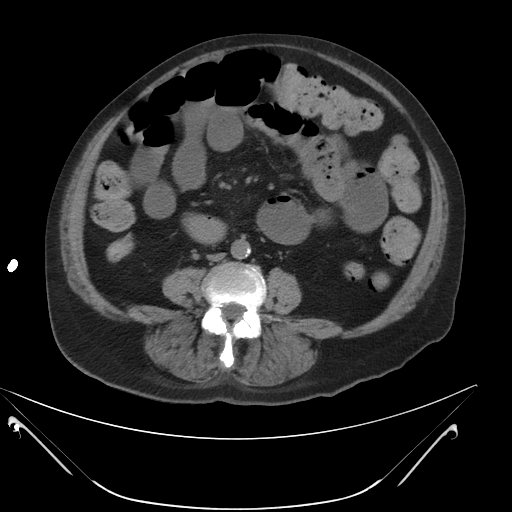
[im 58/97  soft-tissue]
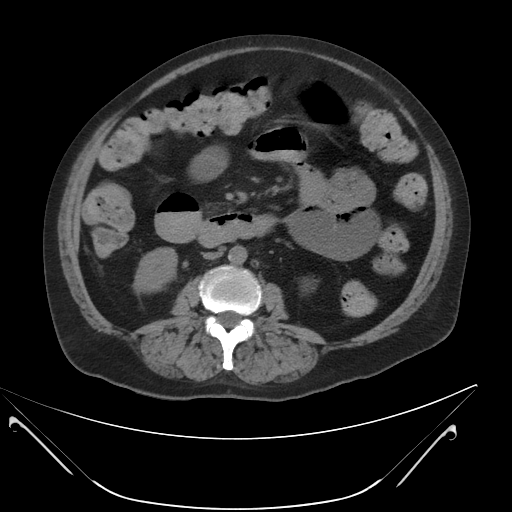
[im 58/97  bone]
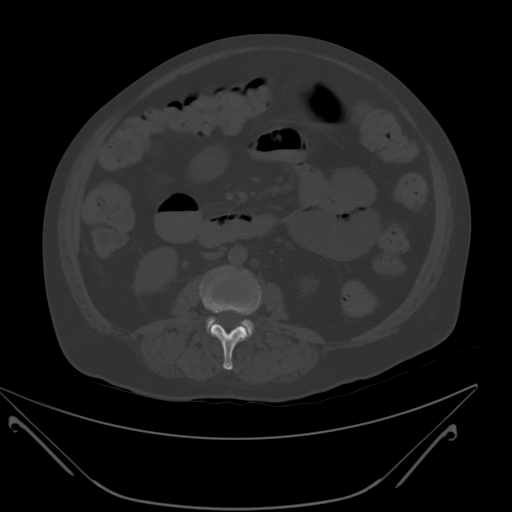
[im 66/97  soft-tissue]
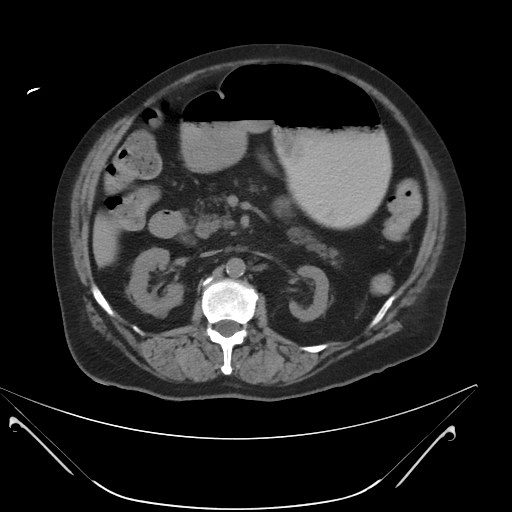
[im 73/97  soft-tissue]
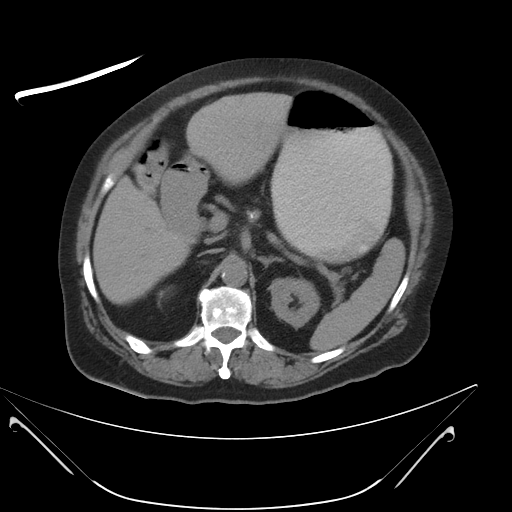
[im 77/97  soft-tissue]
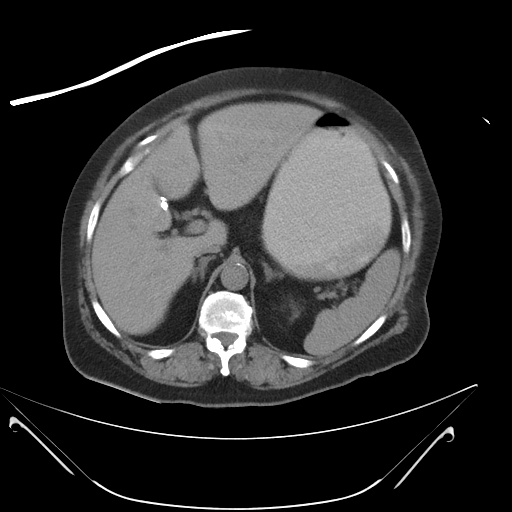
[im 85/97  soft-tissue]
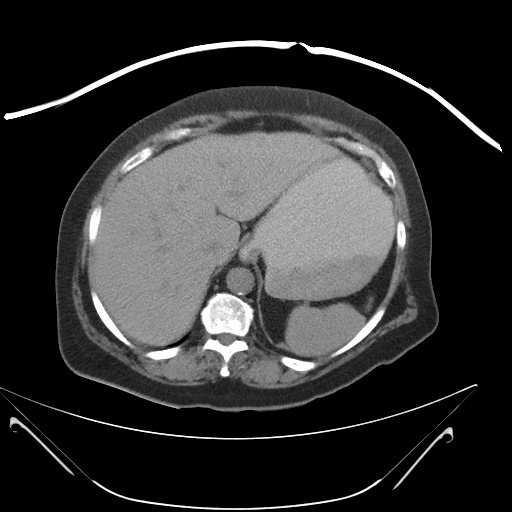
[im 93/97  soft-tissue]
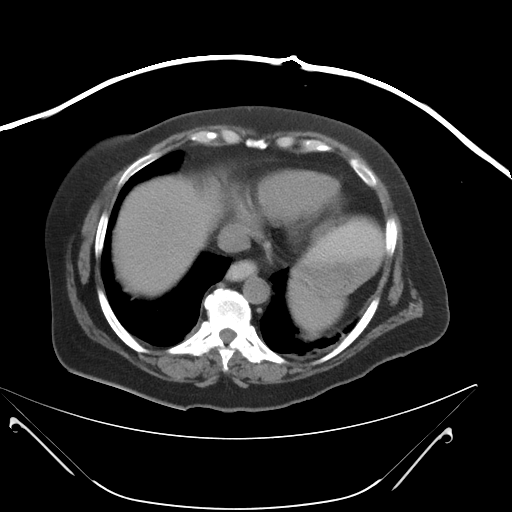

[mpr, coronals, coronal · coronal · 0.94mm/px · 3 of 120 slices shown]
[im 40/120  soft-tissue]
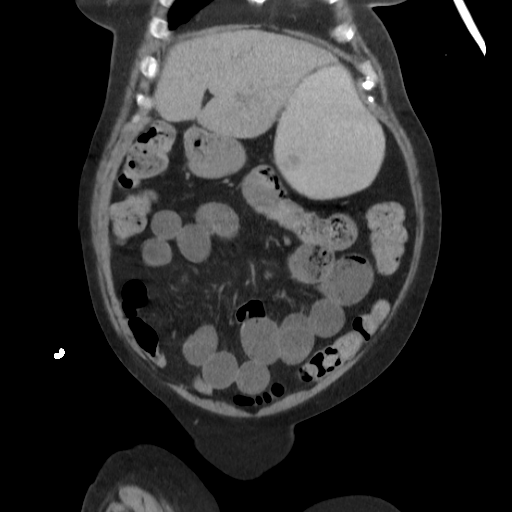
[im 53/120  soft-tissue]
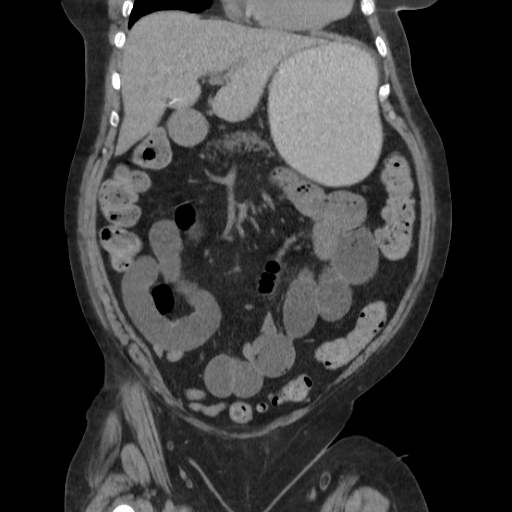
[im 67/120  soft-tissue]
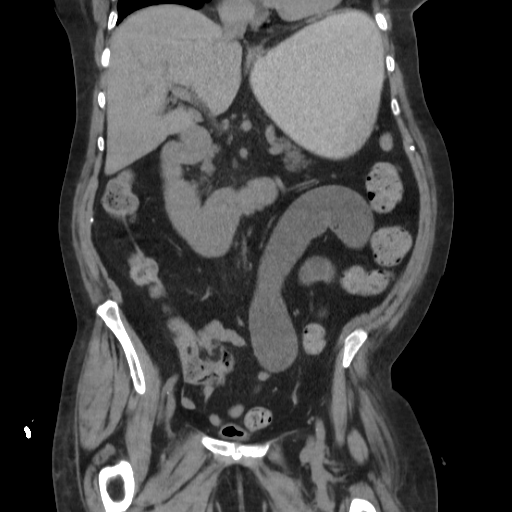

[17 of 46 positions shown; findings below may reference images not displayed]

FINDINGS: No focal abnormalities seen in the liver or spleen on
this study performed without intravenous contrast material.  The
stomach is distended.  The duodenum is mildly distended.

Gallbladder is surgically absent.  Pancreas is atrophic.  The
adrenal glands are normal.  Posterior right renal cyst is
unchanged.  No abdominal aortic aneurysm.

The patient has a dilated small bowel loops in the mid abdomen and
upper pelvis.  These measure up to 4.5 cm in diameter.  Some of the
loops have debris within the fluid suggesting stasis.  Although
numerous adhesions are visible in the abdomen, eight discrete
transition zone cannot be identified.  However, distal small
bowel/ileum is completely collapsed.  Air and stool is seen
scattered along the length of the colon.

There is no free fluid in the peroneal cavity.  No interloop
mesenteric fluid is evident.  No small bowel wall thickening can be
discerned.  There is no intraperitoneal free air.

Bone windows reveal no worrisome lytic or sclerotic osseous
lesions.
IMPRESSION: Dilated fluid-filled small bowel loops measure up to 4.5 cm in
diameter.  Although numerous small bowel adhesions can be
identified in the abdomen, a discrete transition zone is not
visualized.  However the distal ileal loops and terminal ileum are
completely decompressed.  Imaging features are compatible with
small bowel obstruction, likely secondary to adhesion.  No evidence
for associated free fluid or free air.  No small bowel wall
thickening is identified.

I personally called these findings to Dr. Eniith at 3313 hours on
09/11/2010.

## 2012-08-11 IMAGING — CR DG ABDOMEN 2V
2 series · 2 of 2 positions shown · non-contrast
Comparison: Abdomen films of 09/10/2010

CLINICAL DATA: Abdominal distention, follow up of small bowel
obstruction

ABDOMEN - 2 VIEW

[w abdomen decub *]
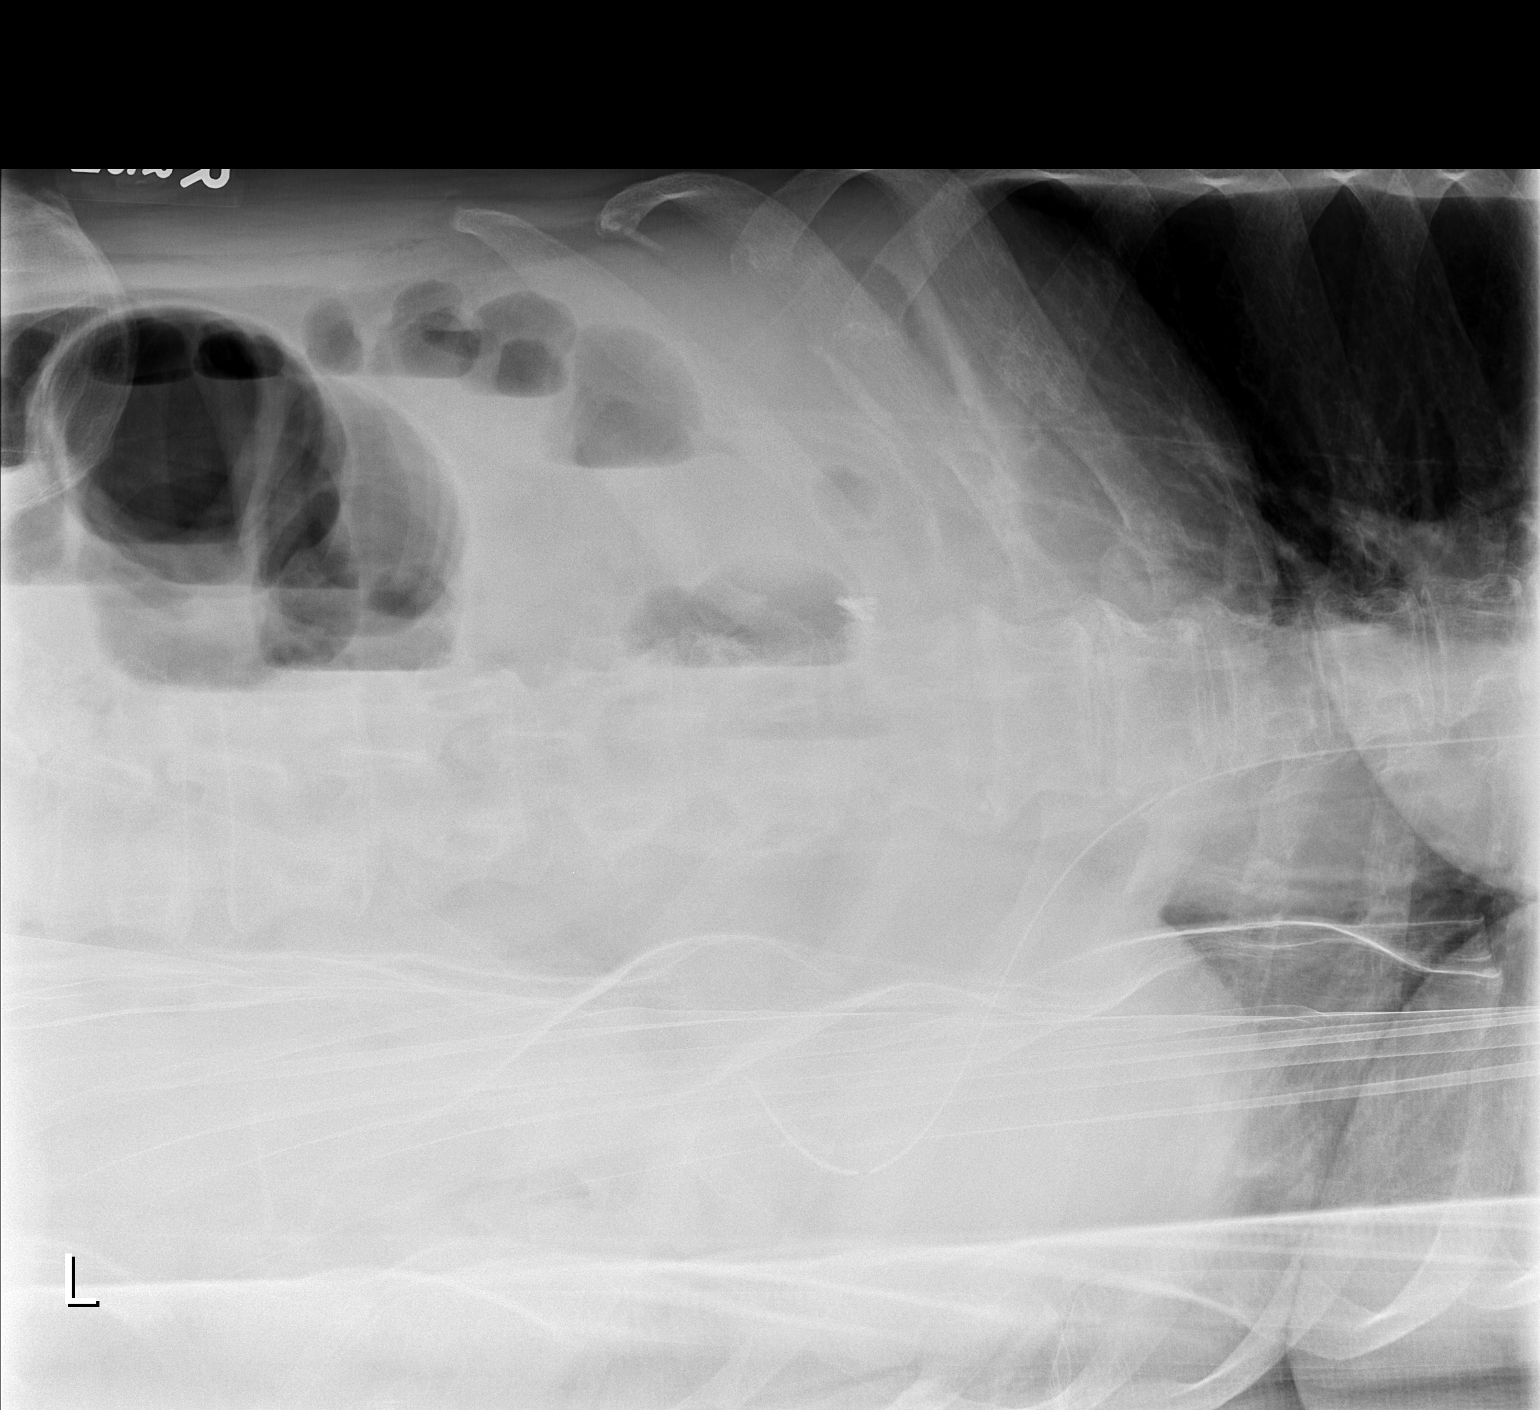

[t abdomen supine *]
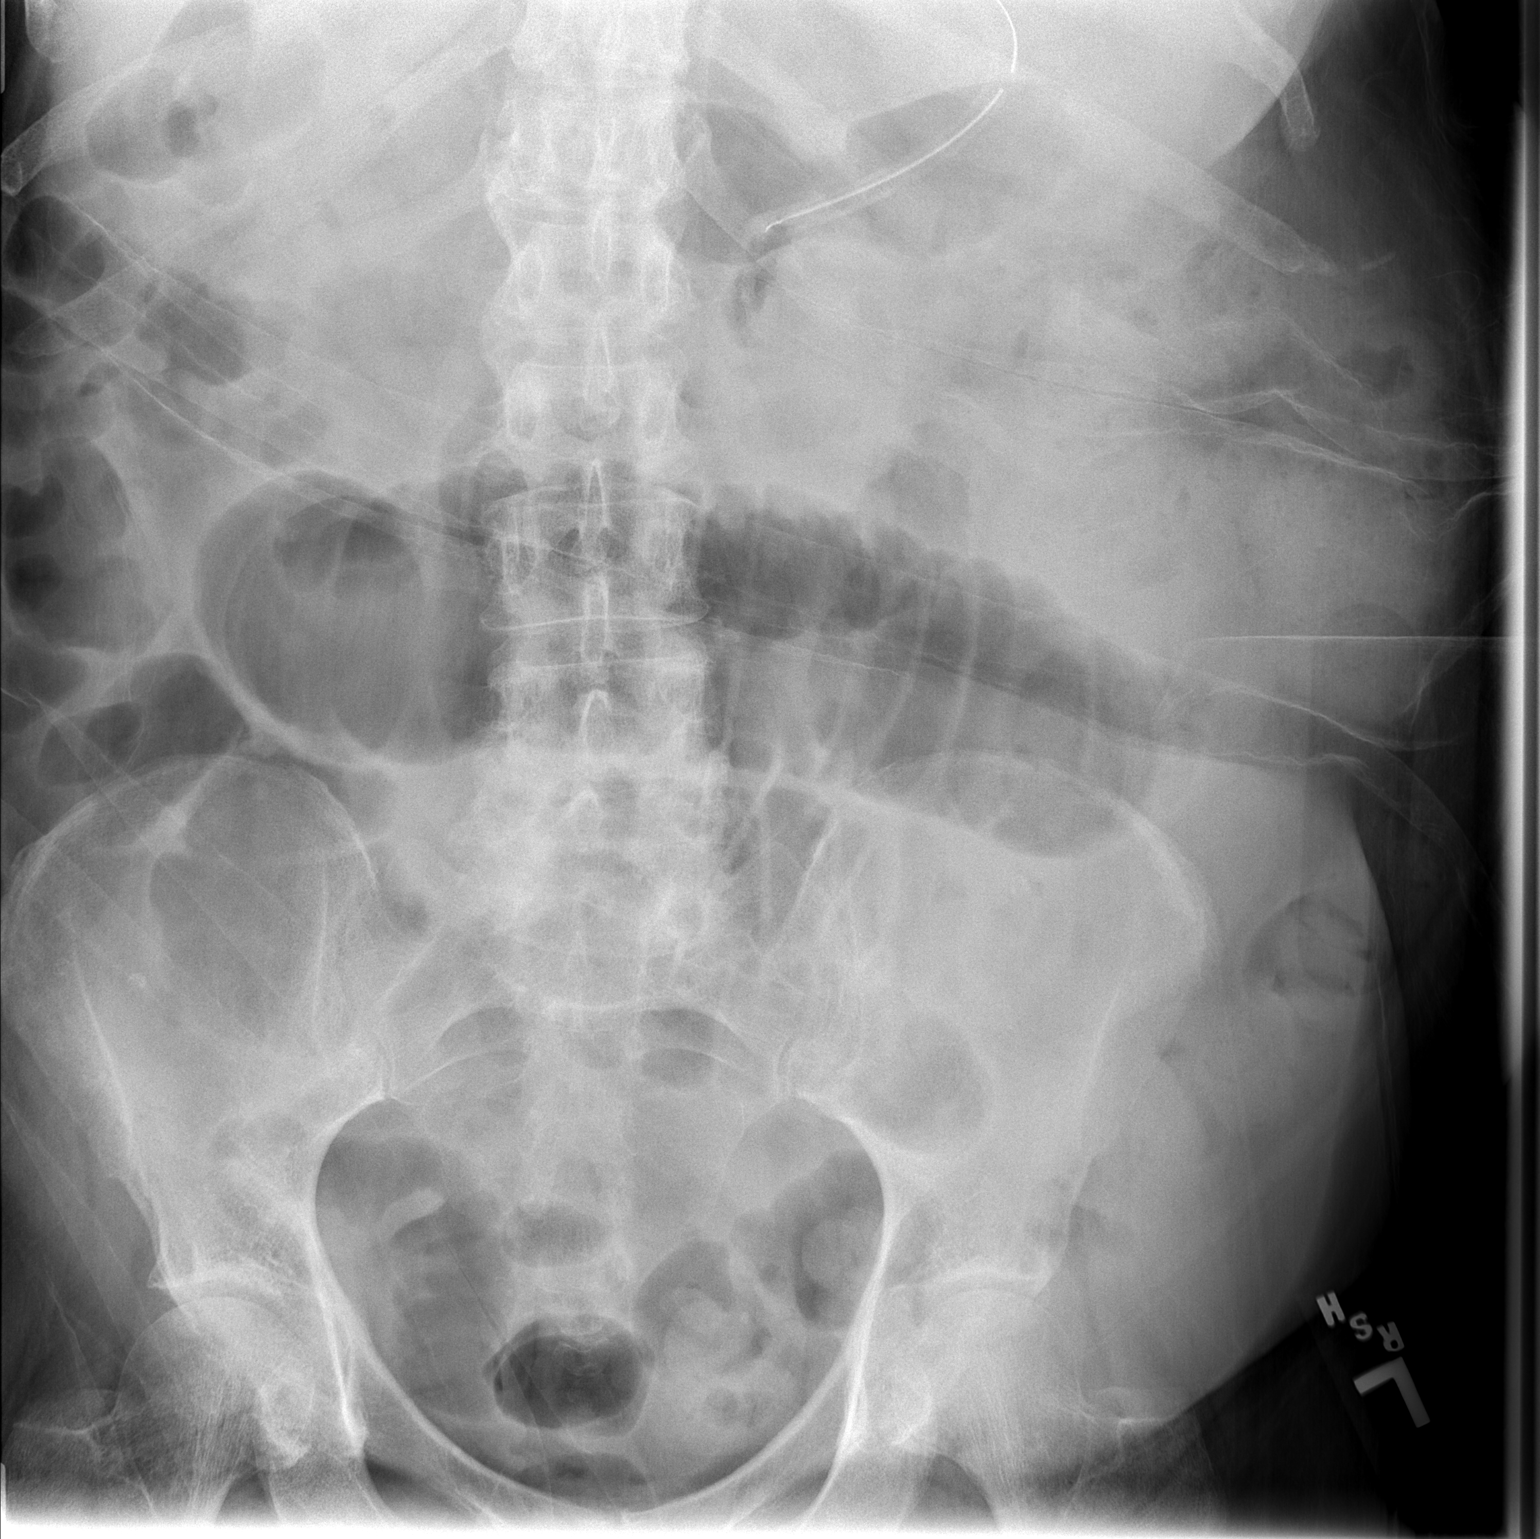

[2 of 2 positions shown; findings below may reference images not displayed]

FINDINGS: There is little change in considerable gaseous distention
of small bowel with a dilated loop in the midabdomen currently
measuring 79 mm.  NG tube is present with the tip in the distal
body of the stomach.  On the left side decubitus, no free air is
seen. Scattered air-fluid levels are noted throughout the distended
small bowel.
IMPRESSION: Little change in degree of small bowel obstruction with significant
gaseous distention of small bowel.

## 2012-08-12 IMAGING — CR DG ABDOMEN 2V
1 series · 1 of 1 positions shown · non-contrast
Comparison: 09/11/2010

CLINICAL DATA: Small bowel obstruction

ABDOMEN - 2 VIEW

[w abdomen decub]
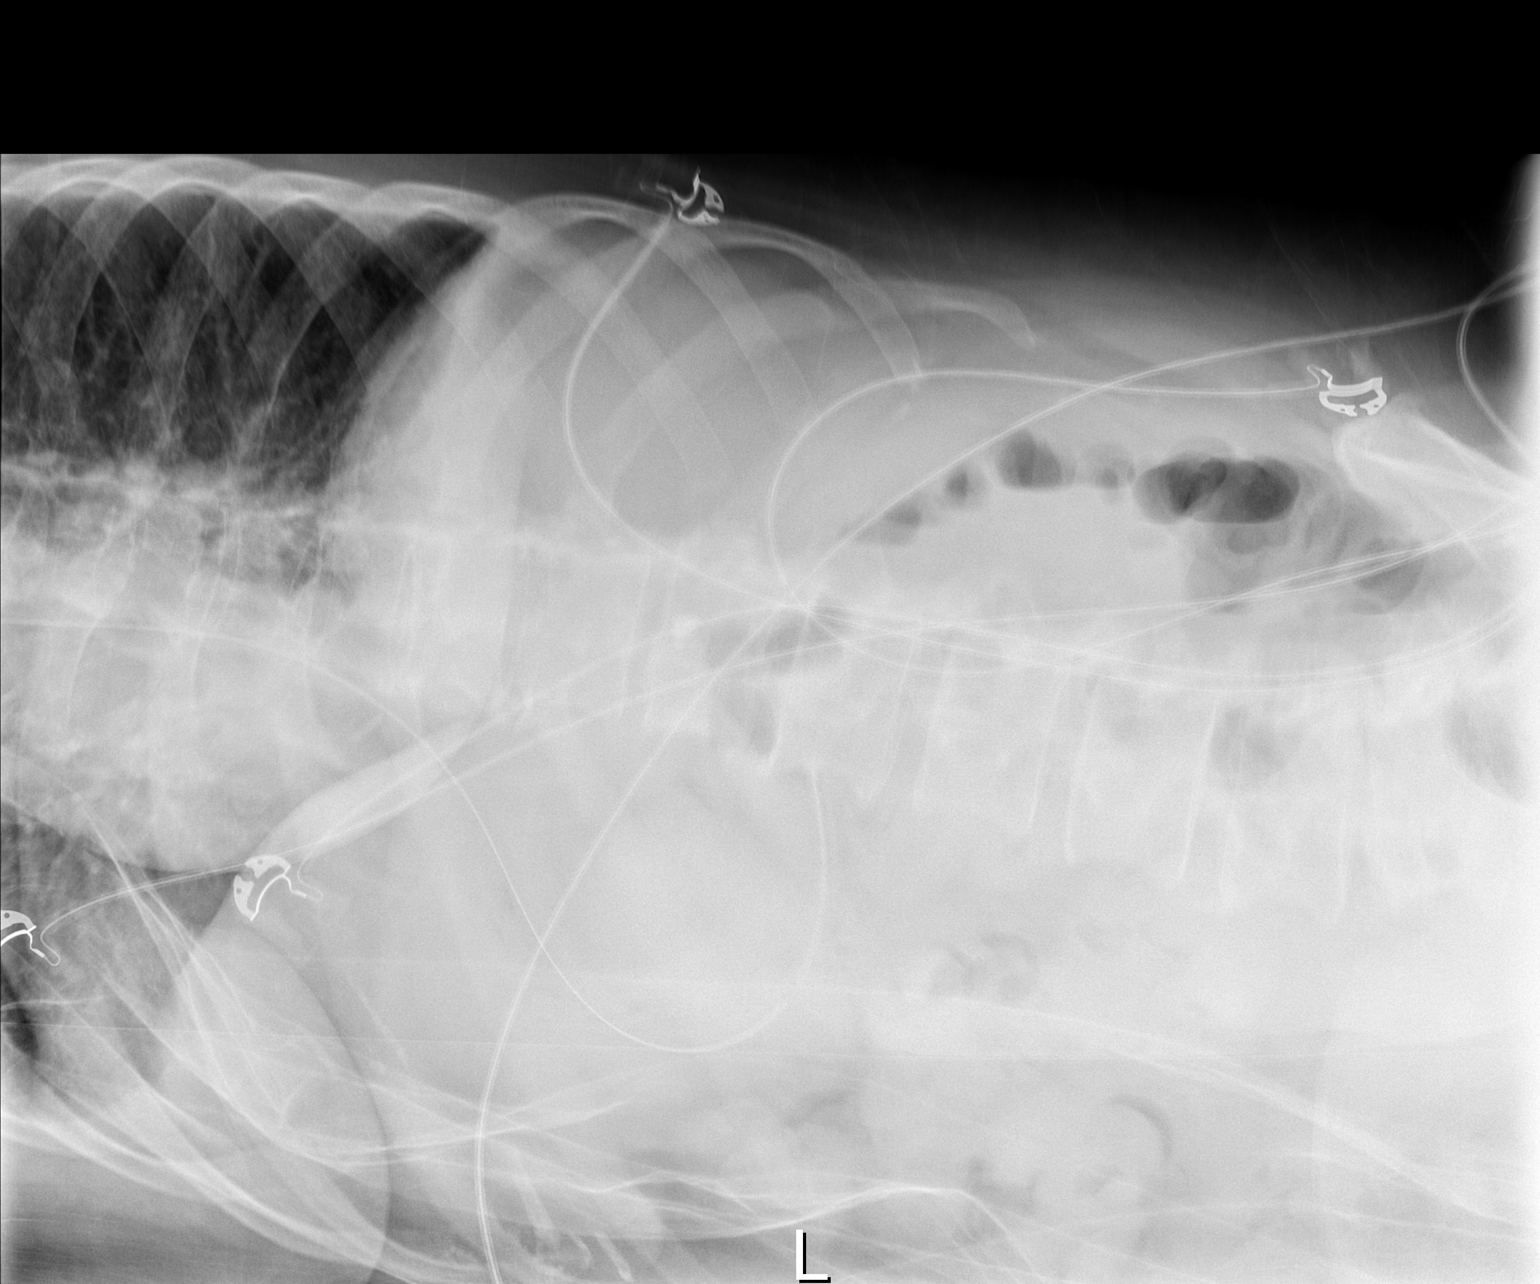

[1 of 1 positions shown; findings below may reference images not displayed]

FINDINGS: There is a nonspecific nonobstructive bowel gas pattern.
NG tube in place is noted.  Moderate stool noted distal colon.  No
free abdominal air.
IMPRESSION: NG tube in place.  Nonspecific nonobstructive bowel gas pattern.
Moderate colonic stool in distal colon.

## 2012-08-15 DIAGNOSIS — I129 Hypertensive chronic kidney disease with stage 1 through stage 4 chronic kidney disease, or unspecified chronic kidney disease: Secondary | ICD-10-CM | POA: Diagnosis not present

## 2012-08-17 DIAGNOSIS — Z79899 Other long term (current) drug therapy: Secondary | ICD-10-CM | POA: Diagnosis not present

## 2012-08-22 DIAGNOSIS — N189 Chronic kidney disease, unspecified: Secondary | ICD-10-CM | POA: Diagnosis not present

## 2012-08-22 DIAGNOSIS — F259 Schizoaffective disorder, unspecified: Secondary | ICD-10-CM | POA: Diagnosis not present

## 2012-08-22 DIAGNOSIS — I129 Hypertensive chronic kidney disease with stage 1 through stage 4 chronic kidney disease, or unspecified chronic kidney disease: Secondary | ICD-10-CM | POA: Diagnosis not present

## 2012-08-24 DIAGNOSIS — E119 Type 2 diabetes mellitus without complications: Secondary | ICD-10-CM | POA: Diagnosis not present

## 2012-08-24 DIAGNOSIS — D649 Anemia, unspecified: Secondary | ICD-10-CM | POA: Diagnosis not present

## 2012-08-24 DIAGNOSIS — R6889 Other general symptoms and signs: Secondary | ICD-10-CM | POA: Diagnosis not present

## 2012-08-24 DIAGNOSIS — I2510 Atherosclerotic heart disease of native coronary artery without angina pectoris: Secondary | ICD-10-CM | POA: Diagnosis not present

## 2012-08-30 ENCOUNTER — Other Ambulatory Visit: Payer: Self-pay

## 2012-08-31 ENCOUNTER — Other Ambulatory Visit (HOSPITAL_COMMUNITY): Payer: Self-pay | Admitting: Cardiovascular Disease

## 2012-08-31 DIAGNOSIS — N189 Chronic kidney disease, unspecified: Secondary | ICD-10-CM | POA: Diagnosis not present

## 2012-09-05 DIAGNOSIS — F259 Schizoaffective disorder, unspecified: Secondary | ICD-10-CM | POA: Diagnosis not present

## 2012-09-05 DIAGNOSIS — E119 Type 2 diabetes mellitus without complications: Secondary | ICD-10-CM | POA: Diagnosis not present

## 2012-09-05 DIAGNOSIS — I129 Hypertensive chronic kidney disease with stage 1 through stage 4 chronic kidney disease, or unspecified chronic kidney disease: Secondary | ICD-10-CM | POA: Diagnosis not present

## 2012-09-05 DIAGNOSIS — N189 Chronic kidney disease, unspecified: Secondary | ICD-10-CM | POA: Diagnosis not present

## 2012-09-05 DIAGNOSIS — J441 Chronic obstructive pulmonary disease with (acute) exacerbation: Secondary | ICD-10-CM | POA: Diagnosis not present

## 2012-09-06 ENCOUNTER — Other Ambulatory Visit (HOSPITAL_COMMUNITY): Payer: Self-pay | Admitting: *Deleted

## 2012-09-07 ENCOUNTER — Encounter (HOSPITAL_COMMUNITY): Payer: Self-pay

## 2012-09-08 DIAGNOSIS — N189 Chronic kidney disease, unspecified: Secondary | ICD-10-CM | POA: Diagnosis not present

## 2012-09-08 DIAGNOSIS — J441 Chronic obstructive pulmonary disease with (acute) exacerbation: Secondary | ICD-10-CM | POA: Diagnosis not present

## 2012-09-08 DIAGNOSIS — E119 Type 2 diabetes mellitus without complications: Secondary | ICD-10-CM | POA: Diagnosis not present

## 2012-09-08 DIAGNOSIS — F259 Schizoaffective disorder, unspecified: Secondary | ICD-10-CM | POA: Diagnosis not present

## 2012-09-08 DIAGNOSIS — I129 Hypertensive chronic kidney disease with stage 1 through stage 4 chronic kidney disease, or unspecified chronic kidney disease: Secondary | ICD-10-CM | POA: Diagnosis not present

## 2012-09-11 ENCOUNTER — Encounter (HOSPITAL_COMMUNITY)
Admission: RE | Admit: 2012-09-11 | Discharge: 2012-09-11 | Disposition: A | Discharge status: Home / Self Care | Payer: Medicare Other | Source: Ambulatory Visit | Attending: Nephrology | Admitting: Nephrology

## 2012-09-11 DIAGNOSIS — D649 Anemia, unspecified: Secondary | ICD-10-CM | POA: Diagnosis not present

## 2012-09-11 MED ORDER — FERUMOXYTOL INJECTION 510 MG/17 ML: 510.0000 mg | INTRAVENOUS | Status: DC

## 2012-09-11 MED ORDER — FERUMOXYTOL INJECTION 510 MG/17 ML
INTRAVENOUS | Status: AC
  Administered 2012-09-11: 510 mg via INTRAVENOUS
  Filled 2012-09-11: qty 17

## 2012-09-11 MED ORDER — SODIUM CHLORIDE 0.9 % IV SOLN
INTRAVENOUS | Status: DC
  Administered 2012-09-11: 12:00:00 via INTRAVENOUS

## 2012-09-11 NOTE — Progress Notes (Signed)
Pt stated that she was feeling bad this morning, dizzy, and heart racing.  VSS in short stay, with HR slightly elevated at 105 but regular.  Pt stated after she was given her iron she felt better.  I instructed the pt and daughter that if she started to feel bad again today after she left to be sure to call her Dr's office.  They verbalized understanding.  I also instructed her on the use of the call bell and told her to let us know if she began to feel bad again while she was here with [place].

## 2012-09-12 ENCOUNTER — Ambulatory Visit (HOSPITAL_COMMUNITY)
Admission: RE | Admit: 2012-09-12 | Discharge: 2012-09-12 | Disposition: A | Discharge status: Home / Self Care | Payer: Medicare Other | Source: Ambulatory Visit | Attending: Cardiovascular Disease | Admitting: Cardiovascular Disease

## 2012-09-12 DIAGNOSIS — R42 Dizziness and giddiness: Secondary | ICD-10-CM | POA: Diagnosis not present

## 2012-09-12 DIAGNOSIS — R5381 Other malaise: Secondary | ICD-10-CM | POA: Diagnosis not present

## 2012-09-12 DIAGNOSIS — R5383 Other fatigue: Secondary | ICD-10-CM | POA: Insufficient documentation

## 2012-09-12 DIAGNOSIS — R0602 Shortness of breath: Secondary | ICD-10-CM | POA: Insufficient documentation

## 2012-09-12 DIAGNOSIS — R079 Chest pain, unspecified: Secondary | ICD-10-CM | POA: Diagnosis not present

## 2012-09-12 MED ORDER — AMINOPHYLLINE 25 MG/ML IV SOLN
75.0000 mg | Freq: Once | INTRAVENOUS | Status: AC
  Administered 2012-09-12: 75 mg via INTRAVENOUS

## 2012-09-12 MED ORDER — REGADENOSON 0.4 MG/5ML IV SOLN
0.4000 mg | Freq: Once | INTRAVENOUS | Status: AC
  Administered 2012-09-12: 0.4 mg via INTRAVENOUS

## 2012-09-12 MED ORDER — TECHNETIUM TC 99M SESTAMIBI GENERIC - CARDIOLITE
9.5000 | Freq: Once | INTRAVENOUS | Status: AC | PRN
  Administered 2012-09-12: 10 via INTRAVENOUS

## 2012-09-12 MED ORDER — TECHNETIUM TC 99M SESTAMIBI GENERIC - CARDIOLITE
31.6000 | Freq: Once | INTRAVENOUS | Status: AC | PRN
  Administered 2012-09-12: 32 via INTRAVENOUS

## 2012-09-12 NOTE — Procedures (Addendum)
[facility] [address] [place] [address] [phone]  Cardiology Nuclear Med Study  Rebecca [name] is a 72 y.o. 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 Weight:  179 lb (81.194 kg)   Tech Comments:  N/A    Nuclear Med Study 1 or 2 day study: 1 day  Stress Test Type:  Lexiscan  Order Authorizing Provider:  Susa Griffins, MD   Resting Radionuclide: Technetium 5m Sestamibi  Resting Radionuclide Dose: 9.5 mCi   Stress Radionuclide:  Technetium 62m Sestamibi  Stress Radionuclide Dose: 31.6 mCi           Stress Protocol Rest HR: 72 Stress HR: 81  Rest BP: 115/65 Stress BP: 123/56  Exercise Time (min): n/a METS: n/a          Dose of Adenosine (mg):  n/a Dose of Lexiscan: 0.4 mg  Dose of Atropine (mg): n/a Dose of Dobutamine: n/a mcg/kg/min (at max HR)  Stress Test Technologist: Ernestene Mention, CCT Nuclear Technologist: Gonzella Lex, CNMT   Rest Procedure:  Myocardial perfusion imaging was performed at rest 45 minutes following the intravenous administration of Technetium 13m Sestamibi. Stress Procedure:  The patient received IV Lexiscan 0.4 mg over 15-seconds.  Technetium 41m Sestamibi injected at 30-seconds.  Due to patient's shortness of breath and stomach pains, she was given IV Aminophylline 75 mg. Symptoms were resolved  during recovery.There were no significant changes with Lexiscan.  Quantitative spect images were obtained after a 45 minute delay.  Transient Ischemic Dilatation (Normal <1.22):  0.99 Lung/Heart Ratio (Normal <0.45):  0.33 QGS EDV:  46 ml QGS ESV:  15 ml LV Ejection Fraction: 68%  Rest ECG: NSR - Normal EKG  Stress ECG: No significant change from baseline ECG  QPS Raw Data Images:  Significant liver uptake of radiotracer, but this does not shadow the heart Stress Images:  There is decreased uptake in the anterior wall. Rest Images:  There is decreased uptake in the anterior wall. Subtraction (SDS):  No evidence of ischemia. Small fixed anteroapical defect.  Impression Exercise Capacity:  Lexiscan with no exercise. BP Response:  Normal blood pressure response. Clinical Symptoms:  No significant symptoms noted. ECG Impression:  No significant ECG changes with Lexiscan. Comparison with Prior Nuclear Study: No previous nuclear study performed  Overall Impression:  Low risk stress nuclear study. Small area of anteroapical breast attenuation artifact, no ischemia.  LV Wall Motion:  NL LV Function; NL Wall Motion; EF 68%.  Chrystie Nose, MD, [facility] Board Certified in Nuclear Cardiology Attending Cardiologist [facility]  Chrystie Nose, MD  09/13/2012 5:53 PM

## 2012-09-14 DIAGNOSIS — N189 Chronic kidney disease, unspecified: Secondary | ICD-10-CM | POA: Diagnosis not present

## 2012-09-14 DIAGNOSIS — J441 Chronic obstructive pulmonary disease with (acute) exacerbation: Secondary | ICD-10-CM | POA: Diagnosis not present

## 2012-09-14 DIAGNOSIS — I129 Hypertensive chronic kidney disease with stage 1 through stage 4 chronic kidney disease, or unspecified chronic kidney disease: Secondary | ICD-10-CM | POA: Diagnosis not present

## 2012-09-14 DIAGNOSIS — E119 Type 2 diabetes mellitus without complications: Secondary | ICD-10-CM | POA: Diagnosis not present

## 2012-09-14 DIAGNOSIS — F259 Schizoaffective disorder, unspecified: Secondary | ICD-10-CM | POA: Diagnosis not present

## 2012-09-15 DIAGNOSIS — E119 Type 2 diabetes mellitus without complications: Secondary | ICD-10-CM | POA: Diagnosis not present

## 2012-09-15 DIAGNOSIS — J441 Chronic obstructive pulmonary disease with (acute) exacerbation: Secondary | ICD-10-CM | POA: Diagnosis not present

## 2012-09-15 DIAGNOSIS — N189 Chronic kidney disease, unspecified: Secondary | ICD-10-CM | POA: Diagnosis not present

## 2012-09-15 DIAGNOSIS — F259 Schizoaffective disorder, unspecified: Secondary | ICD-10-CM | POA: Diagnosis not present

## 2012-09-15 DIAGNOSIS — I129 Hypertensive chronic kidney disease with stage 1 through stage 4 chronic kidney disease, or unspecified chronic kidney disease: Secondary | ICD-10-CM | POA: Diagnosis not present

## 2012-09-18 ENCOUNTER — Encounter (HOSPITAL_COMMUNITY): Payer: Self-pay

## 2012-09-18 ENCOUNTER — Other Ambulatory Visit (HOSPITAL_COMMUNITY): Payer: Self-pay | Admitting: *Deleted

## 2012-09-18 DIAGNOSIS — R079 Chest pain, unspecified: Secondary | ICD-10-CM | POA: Diagnosis not present

## 2012-09-18 DIAGNOSIS — R5381 Other malaise: Secondary | ICD-10-CM | POA: Diagnosis not present

## 2012-09-18 DIAGNOSIS — E039 Hypothyroidism, unspecified: Secondary | ICD-10-CM | POA: Diagnosis not present

## 2012-09-18 DIAGNOSIS — Z79899 Other long term (current) drug therapy: Secondary | ICD-10-CM | POA: Diagnosis not present

## 2012-09-18 DIAGNOSIS — R6889 Other general symptoms and signs: Secondary | ICD-10-CM | POA: Diagnosis not present

## 2012-09-18 DIAGNOSIS — I2510 Atherosclerotic heart disease of native coronary artery without angina pectoris: Secondary | ICD-10-CM | POA: Diagnosis not present

## 2012-09-19 ENCOUNTER — Ambulatory Visit (INDEPENDENT_AMBULATORY_CARE_PROVIDER_SITE_OTHER): Payer: Medicare Other | Admitting: Family Medicine

## 2012-09-19 ENCOUNTER — Encounter (HOSPITAL_COMMUNITY)
Admission: RE | Admit: 2012-09-19 | Discharge: 2012-09-19 | Disposition: A | Discharge status: Home / Self Care | Payer: Medicare Other | Source: Ambulatory Visit | Attending: Nephrology | Admitting: Nephrology

## 2012-09-19 VITALS — BP 101/66 | HR 94 | Ht 63.0 in | Wt 177.0 lb

## 2012-09-19 DIAGNOSIS — E875 Hyperkalemia: Secondary | ICD-10-CM | POA: Diagnosis not present

## 2012-09-19 DIAGNOSIS — D649 Anemia, unspecified: Secondary | ICD-10-CM | POA: Insufficient documentation

## 2012-09-19 DIAGNOSIS — N19 Unspecified kidney failure: Secondary | ICD-10-CM | POA: Diagnosis not present

## 2012-09-19 DIAGNOSIS — N390 Urinary tract infection, site not specified: Secondary | ICD-10-CM | POA: Insufficient documentation

## 2012-09-19 DIAGNOSIS — R300 Dysuria: Secondary | ICD-10-CM | POA: Diagnosis not present

## 2012-09-19 LAB — POCT URINALYSIS DIPSTICK
Bilirubin, UA: NEGATIVE
Glucose, UA: NEGATIVE
Nitrite, UA: POSITIVE
Urobilinogen, UA: 0.2
pH, UA: 5.5

## 2012-09-19 LAB — POCT UA - MICROSCOPIC ONLY

## 2012-09-19 MED ORDER — SODIUM CHLORIDE 0.9 % IV SOLN
INTRAVENOUS | Status: AC
  Administered 2012-09-19: 14:00:00 via INTRAVENOUS

## 2012-09-19 MED ORDER — CEPHALEXIN 500 MG PO CAPS: 500.0000 mg | ORAL_CAPSULE | Freq: Three times a day (TID) | ORAL | Status: DC

## 2012-09-19 MED ORDER — FERUMOXYTOL INJECTION 510 MG/17 ML
510.0000 mg | INTRAVENOUS | Status: AC
  Administered 2012-09-19: 510 mg via INTRAVENOUS

## 2012-09-19 NOTE — Assessment & Plan Note (Signed)
Appears to be uncomplicated UTI  UA with mod LE and positive nitrites, sent for culture Keflex 500 TID  Cautioned to return if she develops nausea, vomiting, fevers, chills, new back pain,  or if dysuria doesn't resolve.

## 2012-09-19 NOTE — Progress Notes (Signed)
  Subjective:    Patient ID: Rebecca [name], female    DOB: 1941-04-27, 72 y.o.   MRN: [id number]  HPI Pt here for acute vsist  Has had Dysuria for 2 days now, worsening today. She also notes intermittent foul smelling urine.   She denies fevers, chills, sweats, new back pain, and nausea/vomiting She has had diarrhea for 2 days, daughter notes 3-4 loose stools yesterday and 2 today, she is tolerating PO well She did have muscle pains "all over her body" this am and had to be helped out of bed but is now feeling much better.   She is slightly nasueous currently but states that she just got an IV iron infusion adn she felt the same way after the last one  Her daughter is also complaining about the mattress they have for their hospital bed. She states they need a prescription for a gel mattress because it it is causing her mother lots of pain and she is concerned about bed sores.   Review of Systems Per HPI    Objective:   Physical Exam  Gen: NAD, alert, cooperative with exam HEENT: NCAT, MMM CV: RRR, good S1/S2, no murmur Resp: slight Scattered Exp wheezes, non-labored Abd: No CVA tenderness         Assessment & Plan:

## 2012-09-19 NOTE — Addendum Note (Signed)
Addended by: [place], Maher Shon on: 09/19/2012 05:03 PM   Modules accepted: Orders

## 2012-09-19 NOTE — Patient Instructions (Addendum)
Urinary Tract Infection Urinary tract infections (UTIs) can develop anywhere along your urinary tract. Your urinary tract is your body's drainage system for removing wastes and extra water. Your urinary tract includes two kidneys, two ureters, a bladder, and a urethra. Your kidneys are a pair of bean-shaped organs. Each kidney is about the size of your fist. They are located below your ribs, one on each side of your spine. CAUSES Infections are caused by microbes, which are microscopic organisms, including fungi, viruses, and bacteria. These organisms are so small that they can only be seen through a microscope. Bacteria are the microbes that most commonly cause UTIs. SYMPTOMS  Symptoms of UTIs may vary by age and gender of the patient and by the location of the infection. Symptoms in young women typically include a frequent and intense urge to urinate and a painful, burning feeling in the bladder or urethra during urination. Older women and men are more likely to be tired, shaky, and weak and have muscle aches and abdominal pain. A fever may mean the infection is in your kidneys. Other symptoms of a kidney infection include pain in your back or sides below the ribs, nausea, and vomiting. DIAGNOSIS To diagnose a UTI, your caregiver will ask you about your symptoms. Your caregiver also will ask to provide a urine sample. The urine sample will be tested for bacteria and white blood cells. White blood cells are made by your body to help fight infection. TREATMENT  Typically, UTIs can be treated with medication. Because most UTIs are caused by a bacterial infection, they usually can be treated with the use of antibiotics. The choice of antibiotic and length of treatment depend on your symptoms and the type of bacteria causing your infection. HOME CARE INSTRUCTIONS  If you were prescribed antibiotics, take them exactly as your caregiver instructs you. Finish the medication even if you feel better after you  have only taken some of the medication.  Drink enough water and fluids to keep your urine clear or pale yellow.  Avoid caffeine, tea, and carbonated beverages. They tend to irritate your bladder.  Empty your bladder often. Avoid holding urine for long periods of time.  Empty your bladder before and after sexual intercourse.  After a bowel movement, women should cleanse from front to back. Use each tissue only once. SEEK MEDICAL CARE IF:   You have back pain.  You develop a fever.  Your symptoms do not begin to resolve within 3 days. SEEK IMMEDIATE MEDICAL CARE IF:   You have severe back pain or lower abdominal pain.  You develop chills.  You have nausea or vomiting.  You have continued burning or discomfort with urination. MAKE SURE YOU:   Understand these instructions.  Will watch your condition.  Will get help right away if you are not doing well or get worse. Document Released: 03/10/2005 Document Revised: 11/30/2011 Document Reviewed: 07/09/2011 Exit[employer] Patient Information 2013 [employer].  

## 2012-09-20 ENCOUNTER — Telehealth: Payer: Self-pay | Admitting: Family Medicine

## 2012-09-20 NOTE — Telephone Encounter (Signed)
Was seen yesterday by Dr Ermalinda Memos and was told to call to speak with Dr Earnest Bailey about some other concerns she has.

## 2012-09-20 NOTE — Telephone Encounter (Signed)
Discussed with daughter [name] her request for gel mattress.  She is concerned about her mother's fatigue and that due to this she is staying in bed for more than 50% of the day.  Is currently being treated with iron infusions from nephrology and in undergoing treatment for urinary tract infection.  Discussed that patient is full ambulatory and has no pressure sores and thus would not qualify.  Daughter is concerned about increasing spine pain.  Advised to come in for appointment for further evaluation as this is new and different from her chronic intermittent low back pain.

## 2012-09-20 NOTE — Telephone Encounter (Signed)
Called and spoke with daughter.  She states that her "mothers mattress has no support and sinks.  Even the physical therapist told her that was the worse mattress she has ever seen".  Daughter called [facility] and they told her that if the MD could send an order for a Gel Support Mattress, but also indicate that she spends more than half the day in bed and that if she does not get a new mattress then she will start to develop bed sores, then she should qualify for a new mattress.  Pts daughter is ok with coming in if she needs to but wanted to ask MD first.  Will forward to Dr. Earnest Bailey.  [name], [name]

## 2012-09-21 ENCOUNTER — Telehealth: Payer: Self-pay | Admitting: Family Medicine

## 2012-09-21 LAB — URINE CULTURE

## 2012-09-21 NOTE — Telephone Encounter (Signed)
Daughter is calling because her mom fell out of bed last night as she was getting up to use the bathroom and is pretty bruised and cut her hand.  She hit her head.  She is concerned that her mattress is not a good one that it affects her spine and makes it so that she does not have good use of her legs and arms.  She and her brother were able to get her up and to the recliner and they cleaned her up but they did not call for an ambulance.  I let them know that I would have the triage nurse call them back since she hit her head so that they can know what kind of things to watch for.

## 2012-09-21 NOTE — Telephone Encounter (Signed)
Returned call to patient's daughter.  Patient fell out of bed this morning at 5:00 am trying to get up to go to the bathroom.  Patient "bumped her head and cut her left hand."  No loss of consciousness or bleeding.  Cleaned up and bandaged hand.  No knot on head; "just a little puffy and tender to touch."  Patient did not have her pain med last night.    Knees are sore from fall--bruised but not bleeding.  Patient is on baby ASA and Plavix was d/c'd.  Daughter's main concern is getting a new mattress because of patient's spine---"mattress is cheap and shoddy and offers no support to her back."  Patient has an appt with Dr. Earnest Bailey next week to evaluate spine.  Daughter will continue to monitor patient and call back with any questions or concerns.  Gaylene Brooks, RN

## 2012-09-26 ENCOUNTER — Ambulatory Visit (INDEPENDENT_AMBULATORY_CARE_PROVIDER_SITE_OTHER): Payer: Medicare Other | Admitting: Family Medicine

## 2012-09-26 ENCOUNTER — Ambulatory Visit
Admission: RE | Admit: 2012-09-26 | Discharge: 2012-09-26 | Disposition: A | Discharge status: Home / Self Care | Payer: Medicare Other | Source: Ambulatory Visit | Attending: Family Medicine | Admitting: Family Medicine

## 2012-09-26 ENCOUNTER — Encounter: Payer: Self-pay | Admitting: Family Medicine

## 2012-09-26 VITALS — BP 120/72 | HR 86 | Temp 97.5°F | Ht 63.0 in | Wt 177.0 lb

## 2012-09-26 DIAGNOSIS — N390 Urinary tract infection, site not specified: Secondary | ICD-10-CM

## 2012-09-26 DIAGNOSIS — D649 Anemia, unspecified: Secondary | ICD-10-CM

## 2012-09-26 DIAGNOSIS — M129 Arthropathy, unspecified: Secondary | ICD-10-CM

## 2012-09-26 DIAGNOSIS — E118 Type 2 diabetes mellitus with unspecified complications: Secondary | ICD-10-CM | POA: Diagnosis not present

## 2012-09-26 DIAGNOSIS — R5383 Other fatigue: Secondary | ICD-10-CM

## 2012-09-26 DIAGNOSIS — M545 Low back pain, unspecified: Secondary | ICD-10-CM | POA: Diagnosis not present

## 2012-09-26 DIAGNOSIS — R531 Weakness: Secondary | ICD-10-CM

## 2012-09-26 DIAGNOSIS — E1165 Type 2 diabetes mellitus with hyperglycemia: Secondary | ICD-10-CM

## 2012-09-26 DIAGNOSIS — IMO0002 Reserved for concepts with insufficient information to code with codable children: Secondary | ICD-10-CM | POA: Diagnosis not present

## 2012-09-26 DIAGNOSIS — E039 Hypothyroidism, unspecified: Secondary | ICD-10-CM | POA: Diagnosis not present

## 2012-09-26 DIAGNOSIS — M533 Sacrococcygeal disorders, not elsewhere classified: Secondary | ICD-10-CM | POA: Diagnosis not present

## 2012-09-26 DIAGNOSIS — N19 Unspecified kidney failure: Secondary | ICD-10-CM

## 2012-09-26 DIAGNOSIS — R5381 Other malaise: Secondary | ICD-10-CM | POA: Diagnosis not present

## 2012-09-26 NOTE — Progress Notes (Signed)
  Subjective:    Patient ID: Rebecca [name], female    DOB: 04-17-1941, 73 y.o.   MRN: [id number]  HPI Here to discuss increasing weakness and recent fall daughter feels is due to back pain  Chronic back pain:  Has had off and on pain for years- managed by tylenol with occasional tramadol.  Daughter feels like when she tapered off prednisone, pain has gotten worse (past 4-6 weeks was finally tapered off).   Tries to limit tramadol to once per day.  Patient reports pain is in low back in "bottom"  Worse with walking.  Unclear if forward bending improves or worsens pain.  No radicular pain.  No focal weakness, numbness, tingling.  Generalized weakness.  Has incontinence at baseline, has been having more accidents since being more weak.  No change i bowel habits.  Fell on 4/10:  Was trying to get off bed to go to the bathroom, fell forward onto her knees and forehead. No syncope her son was there with her.   Has recently completed a course of physical therapy, has been working on posture. Daughter notes fatigue after minimal exertion.  Was discharged from PT/OT for good progress in ambulation and ADL's which daughter disagrees with.  Per daughter, Cardiology and Nephrology decided to try iron infusions to help with palpitations and fatigue.  Had treatment for Klebsiella UTI last week as an outpatient.  States dysuria is improved.  But notes continued intermittent urinary retention with overflow.    Review of Systems Also reports loose stools, headache, worsening short term memory loss, fatigue, cold feet, pain in arms    Objective:   Physical Exam GEN: Alert & Oriented, No acute distress, in good spirits.  Here with daughter CV:  Regular Rate & Rhythm, no murmur Respiratory:  Normal work of breathing, CTAB Abd:  + BS, soft, no tenderness to palpation Ext: no pre-tibial edema MSK:  Mild generalized weakness, unable to appreciate profound proximal muscle weakness, nonfocal.  No temporal tenderness.   Able to get up from chair without assistance and without use of hands, walk to other side of exam room, turn and sit again with fair to good speed and stability. Neuro: reflexes equal bilaterally.   Back; ttp paraspinal lumbar spine.        Assessment & Plan:

## 2012-09-26 NOTE — Patient Instructions (Addendum)
Please schedule 8:30 AM lab draw  Will also order xray of back  Depending on results on lab and xray- may need further more detailed tests  Follow-up with me in 1 week

## 2012-09-27 ENCOUNTER — Other Ambulatory Visit: Payer: Medicare Other

## 2012-09-27 DIAGNOSIS — E039 Hypothyroidism, unspecified: Secondary | ICD-10-CM | POA: Diagnosis not present

## 2012-09-27 DIAGNOSIS — N19 Unspecified kidney failure: Secondary | ICD-10-CM | POA: Diagnosis not present

## 2012-09-27 DIAGNOSIS — R5383 Other fatigue: Secondary | ICD-10-CM | POA: Diagnosis not present

## 2012-09-27 DIAGNOSIS — R531 Weakness: Secondary | ICD-10-CM

## 2012-09-27 DIAGNOSIS — D649 Anemia, unspecified: Secondary | ICD-10-CM

## 2012-09-27 LAB — CBC WITH DIFFERENTIAL/PLATELET
Basophils Absolute: 0 10*3/uL (ref 0.0–0.1)
Eosinophils Absolute: 0 10*3/uL (ref 0.0–0.7)
Eosinophils Relative: 0 % (ref 0–5)
HCT: 29.9 % — ABNORMAL LOW (ref 36.0–46.0)
Lymphocytes Relative: 18 % (ref 12–46)
MCH: 30.4 pg (ref 26.0–34.0)
MCHC: 33.8 g/dL (ref 30.0–36.0)
MCV: 90.1 fL (ref 78.0–100.0)
Monocytes Absolute: 0.8 10*3/uL (ref 0.1–1.0)
Platelets: 195 10*3/uL (ref 150–400)
RDW: 13.8 % (ref 11.5–15.5)

## 2012-09-27 NOTE — Assessment & Plan Note (Addendum)
Weakness with fall with daughter history of significantly worsening functional status and ability to perform ADL's.  Is being treated with iron infusions by nephrology, anemia appears to be at baseline.   Will obtain CBC to further evaluate.  About 15 pound weight loss since December may be more reflective of prednisone therapy as today's weight seems closer to her baseline prior to therapy.  Also in differential is relative adrenal insufficiency after taper of prednisone, PMR.  Will check morning cortisol, TSH, CBC/diff, ESR, CRP  Update:  Reviewed labs, discussed with daughter- most consistent with PMR.  Will trial Prednisone 10 mg daily, had follow-up in 5 days for re-evaluation

## 2012-09-27 NOTE — Progress Notes (Signed)
CMP,CBC WITH DIFF,TSH,CRP,CORTISOL AND ESR DONE TODAY Rebecca [name]

## 2012-09-27 NOTE — Assessment & Plan Note (Signed)
a1c now improved off steroids.  Diet controlled.  Advised can stop checking cbg but feel sit helpe with diet compliance to have some intermittent feedback which is reasonable.

## 2012-09-27 NOTE — Assessment & Plan Note (Addendum)
History of worsening low back pain per patient's daughter suggests significant worsening in the past few weeks of her chronic low back pain.  Unable to elicit significantly worsened pain or focal findings today.  No symptoms suggestive of infection. I do have some concern given recent prolonged course of prednisone.  Will obtain lumbar/sacral xray.  If negative, would consider MRI to further evaluate for etiology of lumbar spinal stenosis or vertebral fracture.  Continue acetaminophen + tramadol which does not seem to be causing significant sedation/imbalance.

## 2012-09-27 NOTE — Assessment & Plan Note (Signed)
Symptoms resolved 

## 2012-09-28 LAB — COMPREHENSIVE METABOLIC PANEL
ALT: 14 U/L (ref 0–35)
AST: 16 U/L (ref 0–37)
Alkaline Phosphatase: 46 U/L (ref 39–117)
CO2: 24 mEq/L (ref 19–32)
Creat: 1.41 mg/dL — ABNORMAL HIGH (ref 0.50–1.10)
Sodium: 133 mEq/L — ABNORMAL LOW (ref 135–145)
Total Bilirubin: 0.3 mg/dL (ref 0.3–1.2)
Total Protein: 6.1 g/dL (ref 6.0–8.3)

## 2012-09-28 LAB — CORTISOL: Cortisol, Plasma: 20.6 ug/dL

## 2012-09-28 LAB — TSH: TSH: 3.358 u[IU]/mL (ref 0.350–4.500)

## 2012-09-28 MED ORDER — PREDNISONE 10 MG PO TABS: 10.0000 mg | ORAL_TABLET | Freq: Every day | ORAL | Status: DC

## 2012-09-28 NOTE — Addendum Note (Signed)
Addended by: Macy Mis on: 09/28/2012 09:34 AM   Modules accepted: Orders, Medications

## 2012-10-03 ENCOUNTER — Ambulatory Visit (INDEPENDENT_AMBULATORY_CARE_PROVIDER_SITE_OTHER): Payer: Medicare Other | Admitting: Family Medicine

## 2012-10-03 ENCOUNTER — Encounter: Payer: Self-pay | Admitting: Family Medicine

## 2012-10-03 ENCOUNTER — Telehealth: Payer: Self-pay | Admitting: *Deleted

## 2012-10-03 VITALS — BP 106/68 | HR 97 | Temp 97.8°F | Ht 63.0 in | Wt 172.3 lb

## 2012-10-03 DIAGNOSIS — R531 Weakness: Secondary | ICD-10-CM

## 2012-10-03 DIAGNOSIS — R5383 Other fatigue: Secondary | ICD-10-CM | POA: Diagnosis not present

## 2012-10-03 DIAGNOSIS — R5381 Other malaise: Secondary | ICD-10-CM | POA: Diagnosis not present

## 2012-10-03 DIAGNOSIS — R300 Dysuria: Secondary | ICD-10-CM | POA: Diagnosis not present

## 2012-10-03 DIAGNOSIS — M353 Polymyalgia rheumatica: Secondary | ICD-10-CM | POA: Insufficient documentation

## 2012-10-03 DIAGNOSIS — R269 Unspecified abnormalities of gait and mobility: Secondary | ICD-10-CM

## 2012-10-03 DIAGNOSIS — R2681 Unsteadiness on feet: Secondary | ICD-10-CM

## 2012-10-03 LAB — POCT URINALYSIS DIPSTICK
Bilirubin, UA: NEGATIVE
Ketones, UA: NEGATIVE
Leukocytes, UA: NEGATIVE
Protein, UA: >=300
Spec Grav, UA: 1.015
pH, UA: 6

## 2012-10-03 LAB — POCT UA - MICROSCOPIC ONLY

## 2012-10-03 NOTE — Assessment & Plan Note (Signed)
Will refer to geri clinic for [facility], history of falls

## 2012-10-03 NOTE — Assessment & Plan Note (Signed)
Improved back to baseline with prednisone

## 2012-10-03 NOTE — Telephone Encounter (Signed)
Per Dr. Earnest Bailey called [name] [facility] daughter) and told her everything was fine with her urinalysis and there was no sign of infection.   Ancil Boozer, SMA

## 2012-10-03 NOTE — Addendum Note (Signed)
Addended by: [place], Brecken Dewoody on: 10/03/2012 03:43 PM   Modules accepted: Orders

## 2012-10-03 NOTE — Progress Notes (Signed)
  Subjective:    Patient ID: Rebecca [name], female    DOB: 1940/11/30, 72 y.o.   MRN: [id number]  HPI  Here for follow-up of weakness, pain  Started on prednisone 10 mg 5 days ago for new diagnosis of PMR.  Reports feels remarkable better.  Spending mch less time in bed, mood improved. Aches and pains much improved.  No vision changes, temporal tenderness.  Back pain back at baseline with no radiculopathy or focal weakness.  No further falls since last appointment.  Reports some tingling with urination last night- consistent with previous episodes of UTI.  Was last treated for UTI several weeks ago- Klebsiella.    Review of Systems See HPI    Objective:   Physical Exam GEN: Alert & Oriented, No acute distress CV:  Regular Rate & Rhythm, no murmur Respiratory:  Normal work of breathing, CTAB Abd:  + BS, soft, no tenderness to palpation Ext: no pre-tibial edema Neuro: strength 5./5 upper and lower extremities.  NO Temporal tenderness       Assessment & Plan:

## 2012-10-03 NOTE — Patient Instructions (Addendum)
Feel free to schedule geriatric assessment with Geriatric [facility]/Dr. Sheffield Slider for history of falls  Will call you about urine test results

## 2012-10-03 NOTE — Assessment & Plan Note (Signed)
No evidence of UTI today on U/A.  Advised may use pyridium OTC prn.

## 2012-10-03 NOTE — Assessment & Plan Note (Signed)
On low dose prednisone, seems to be right dose to balance clinical improvement with side effects.  Will plan on continuing this for several months with slow taper over the next year.   Discussed signs/symptoms of temporal arteritis to let me know.

## 2012-10-03 NOTE — Addendum Note (Signed)
Addended by: Macy Mis on: 10/03/2012 04:29 PM   Modules accepted: Level of Service

## 2012-10-04 IMAGING — CR DG FOOT COMPLETE 3+V*R*
3 series · 3 of 3 positions shown · non-contrast
Comparison: None.

CLINICAL DATA: Fourth metatarsal pain.  No known injury.

RIGHT FOOT COMPLETE - 3+ VIEW

[t foot ap right]
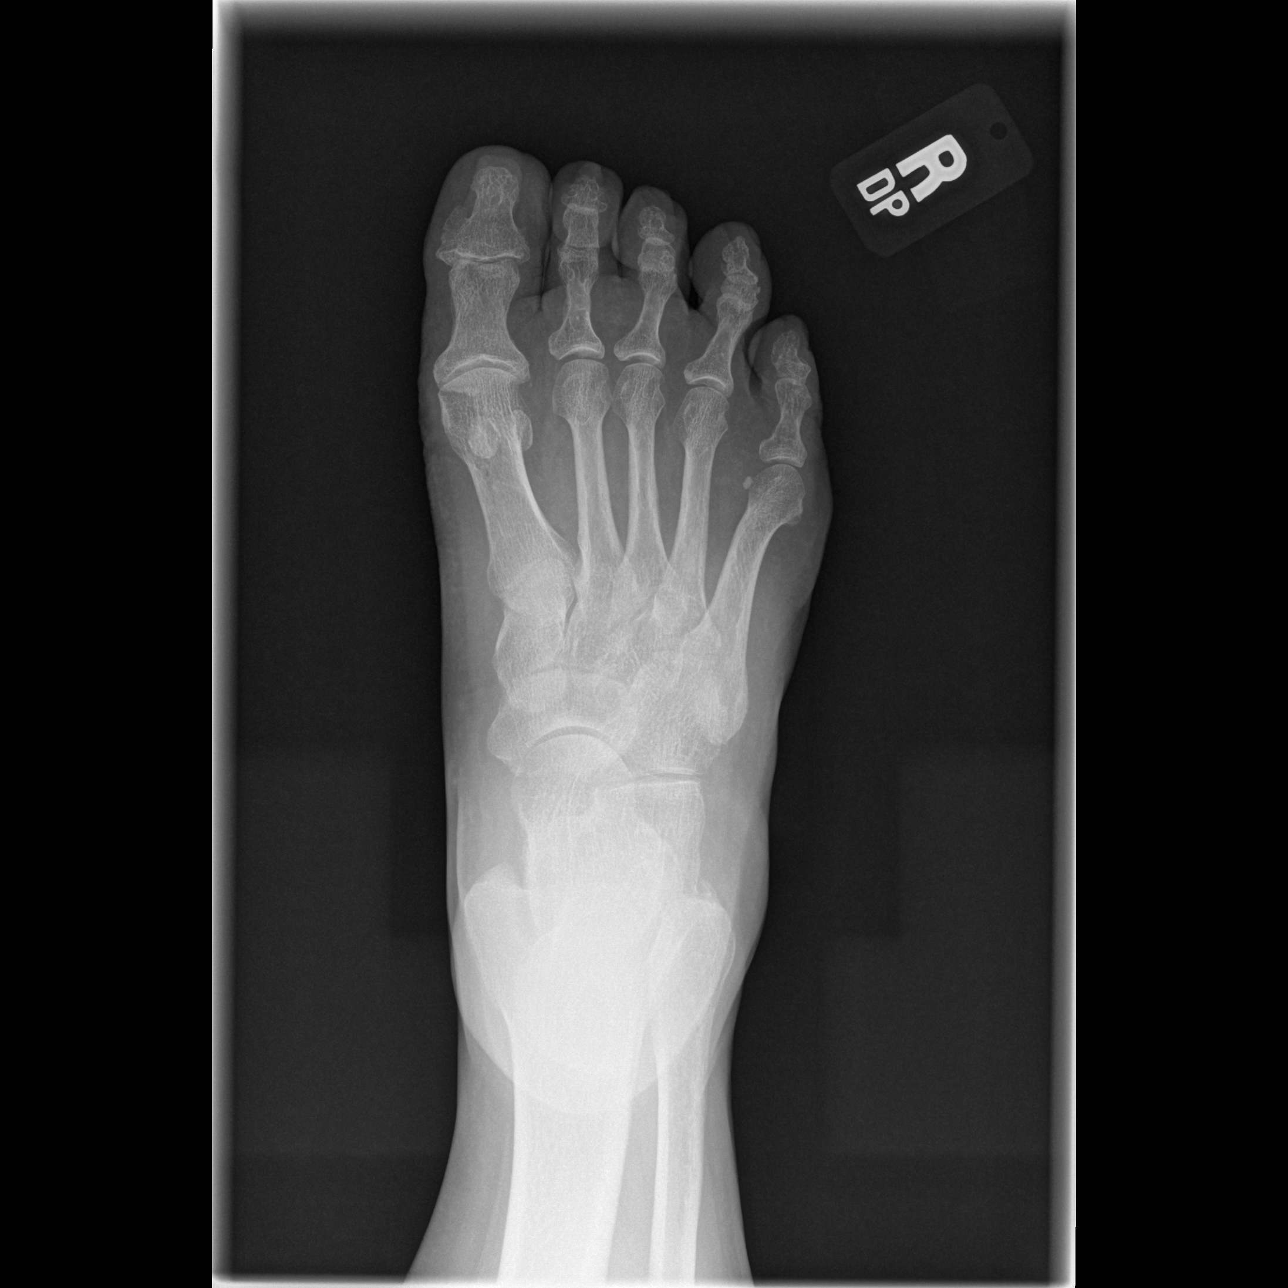

[t foot oblique right]
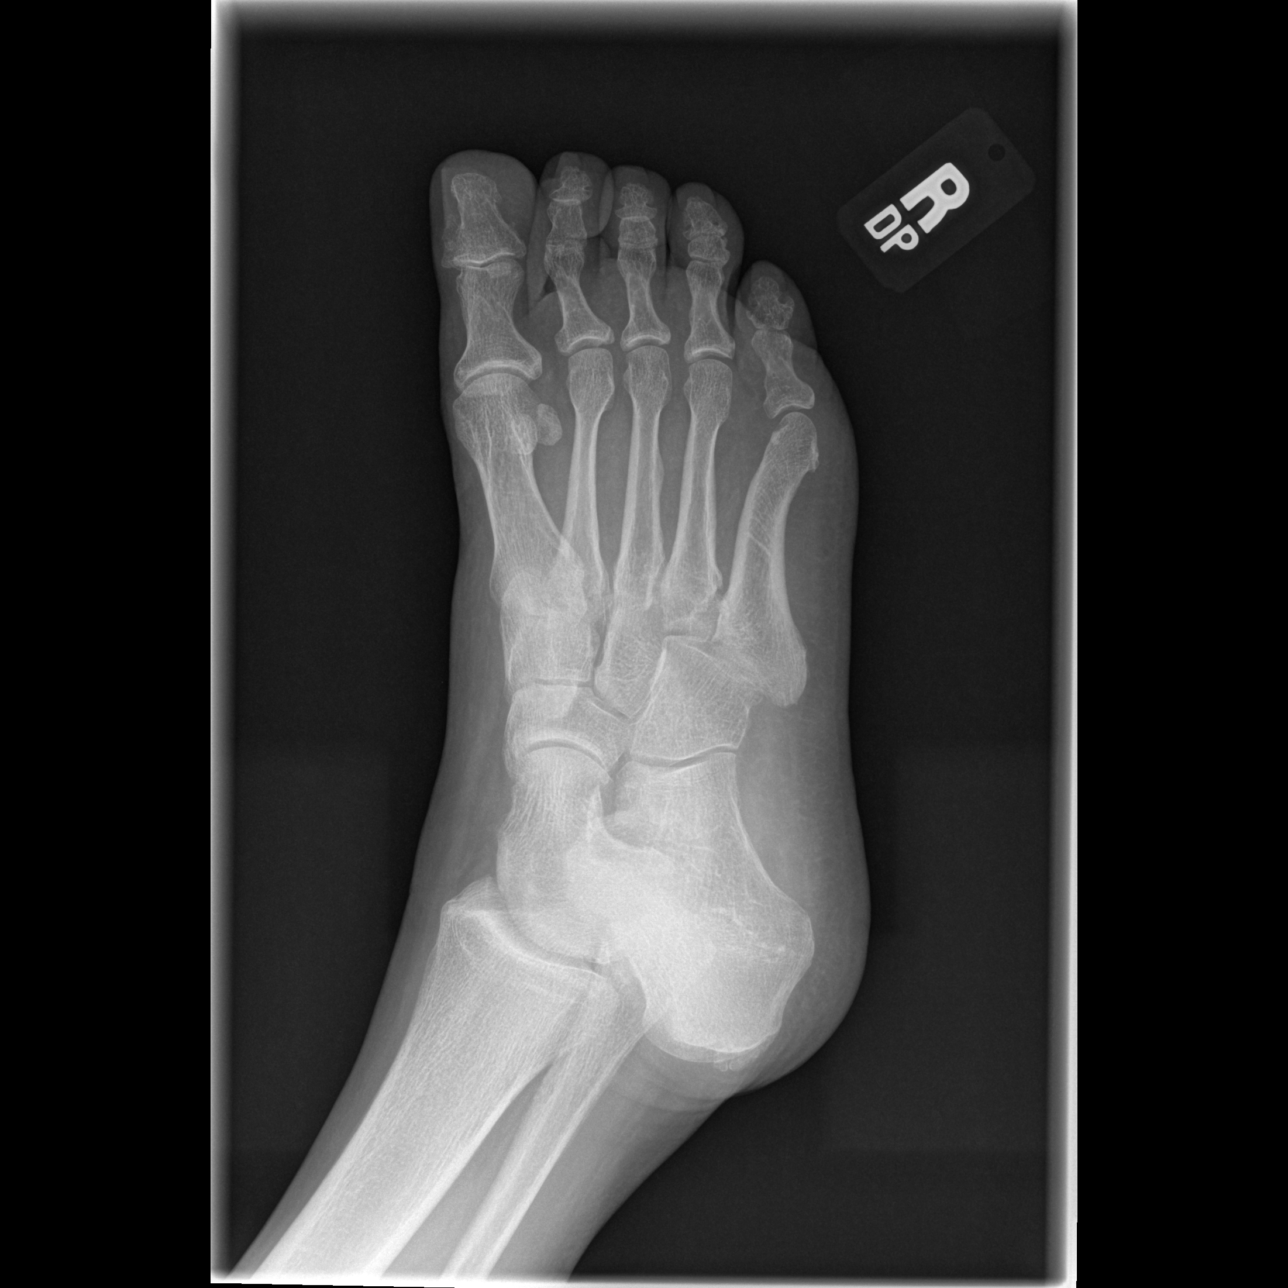

[t foot lat right]
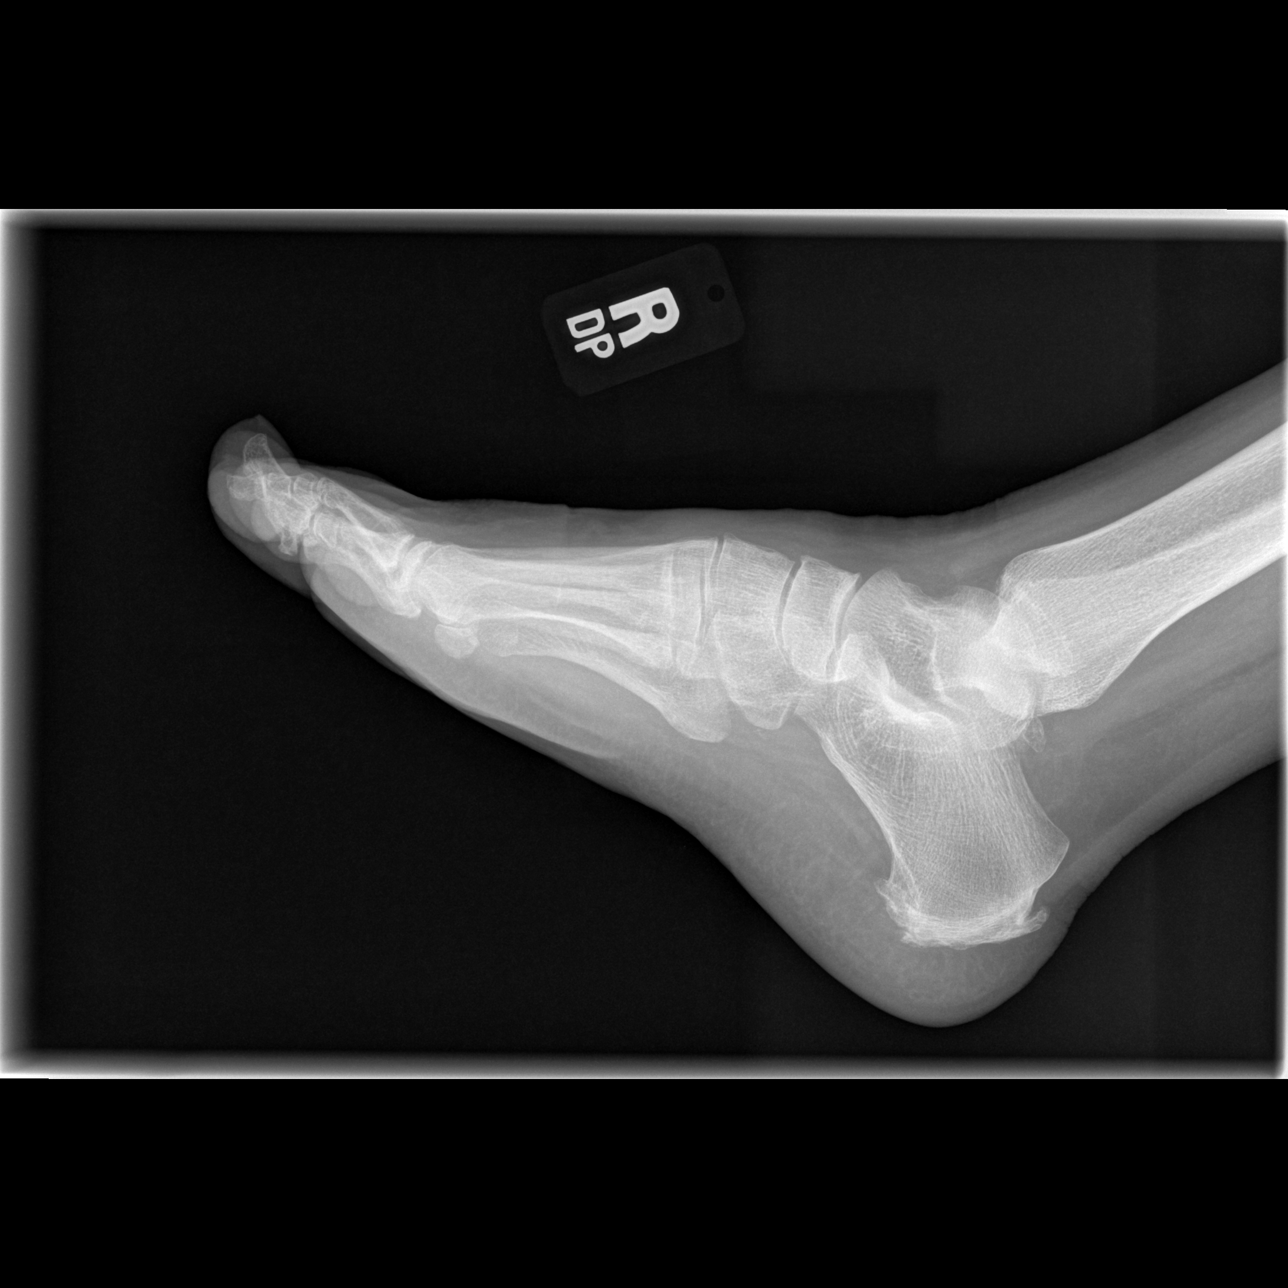

[3 of 3 positions shown; findings below may reference images not displayed]

FINDINGS: The mineralization and alignment are normal.  There is no
evidence of acute fracture or dislocation.  There is no subluxation
at the Lisfranc joint.  There is no evidence of metatarsal stress
fracture.  Mild degenerative changes are present at the first
metatarsal phalangeal joint.  There are plantar and dorsal
calcaneal spurs.
IMPRESSION: No acute osseous findings.  Calcaneal spurring.

## 2012-10-05 ENCOUNTER — Other Ambulatory Visit: Payer: Self-pay | Admitting: Family Medicine

## 2012-10-09 ENCOUNTER — Encounter: Payer: Self-pay | Admitting: Home Health Services

## 2012-10-12 ENCOUNTER — Ambulatory Visit: Payer: Self-pay

## 2012-10-19 ENCOUNTER — Ambulatory Visit (INDEPENDENT_AMBULATORY_CARE_PROVIDER_SITE_OTHER): Payer: Medicare Other | Admitting: Family Medicine

## 2012-10-19 ENCOUNTER — Encounter: Payer: Self-pay | Admitting: Family Medicine

## 2012-10-19 VITALS — BP 145/70 | HR 86 | Temp 98.3°F | Ht 63.0 in | Wt 169.2 lb

## 2012-10-19 DIAGNOSIS — R3989 Other symptoms and signs involving the genitourinary system: Secondary | ICD-10-CM | POA: Diagnosis not present

## 2012-10-19 DIAGNOSIS — R300 Dysuria: Secondary | ICD-10-CM

## 2012-10-19 DIAGNOSIS — M353 Polymyalgia rheumatica: Secondary | ICD-10-CM

## 2012-10-19 DIAGNOSIS — R399 Unspecified symptoms and signs involving the genitourinary system: Secondary | ICD-10-CM

## 2012-10-19 DIAGNOSIS — Z9181 History of falling: Secondary | ICD-10-CM

## 2012-10-19 LAB — POCT URINALYSIS DIPSTICK
Blood, UA: NEGATIVE
Glucose, UA: NEGATIVE
Nitrite, UA: POSITIVE
Protein, UA: 100
Spec Grav, UA: 1.01
Urobilinogen, UA: 0.2

## 2012-10-19 NOTE — Assessment & Plan Note (Signed)
Gave hand out on preventing falls at home, also suggested finding a senior exercise program.  Advised to schedule tylenol for pain.

## 2012-10-19 NOTE — Assessment & Plan Note (Signed)
Urine dipstick showing only Leuks, will send for culture and hold off on any treatment pending culture given history.

## 2012-10-19 NOTE — Progress Notes (Signed)
  Subjective:    Patient ID: Rebecca [name], female    DOB: [birth year], 72 y.o.   MRN: [id number]  HPI  Rebecca [name] for [facility].  She has been diagnosed with Polymyalgia rheumatica, and is on prednisone 10 mg and is improving.  She still complains of joint pain, but has kidney trouble so she is not taking NSAIDS.  Her only recent fall was a fall out of bed.  She has a hospital bed and the mattress is very thin, her daughter had put an overlay on it and it slipped when she was trying to get out of bed and fell.  Otherwise she walks well, there are no rugs in the house and she uses the side door that has a hand rail on the stairs. She did some PT when she had falls earlier this year, but has not done any recently.   Patient also complains of some pain with urination that has been going ion for 2 days, no fever, chills or nausea.  She has this complaint a lot and it is often not a UTI.   Past Medical History  Diagnosis Date  . CHF (congestive heart failure)   . Diabetes mellitus   . S/P laparoscopic cholecystectomy   . Frozen shoulder     right  . Obesity   . Renal insufficiency   . COPD (chronic obstructive pulmonary disease)   . Shortness of breath   . Stroke    History  Substance Use Topics  . Smoking status: Former Smoker -- 1.00 packs/day for 35 years    Quit date: 04/14/2004  . Smokeless tobacco: Not on file  . Alcohol Use: No      Review of Systems See HPI    Objective:   Physical Exam BP 145/70  Pulse 86  Temp(Src) 98.3 F (36.8 C) (Axillary)  Ht 5\' 3"  (1.6 m)  Wt 169 lb 3.2 oz (76.749 kg)  BMI 29.98 kg/m2 General appearance: alert, cooperative and no distress Lungs: clear to auscultation bilaterally Heart: regular rate and rhythm, S1, S2 normal, no murmur, click, rub or gallop Extremities: extremities normal, atraumatic, no cyanosis or edema    Balance Abnormal Patient value  Sitting balance no   Arise good   Attempts to arise 1    Immediate standing balance good   Standing balance good   Nudge good   Eyes closed good   360 degree turn good   Sitting down good    Gait Abnormal Patient value  Initiation of gait normal   Step length-left normal   Step length-right normal   Step height-left normal   Step height-right normal   Step symmetry normal   Step continuity normal   Path straight   Trunk striaght   Walking stance normal         Assessment & Plan:

## 2012-10-19 NOTE — Patient Instructions (Signed)
It was nice to meet you.  Please continue your current medications, we recommend taking acetaminophen (tylenol) XR 650 mg by mouth three times a day to help with your arthritis pain.  We want you to be as active as possible, and we encourage you to look into Silver Sneakers at the [facility] or the [employer].

## 2012-10-19 NOTE — Assessment & Plan Note (Signed)
She had improved discomfort in her joints, but hasn't yet begun an exercise program to improve strength. Vitamin D supplementation was recommended. We also recommended that she investigate exercise programs at the [facility]

## 2012-10-23 ENCOUNTER — Telehealth: Payer: Self-pay | Admitting: Family Medicine

## 2012-10-23 DIAGNOSIS — Z79899 Other long term (current) drug therapy: Secondary | ICD-10-CM | POA: Diagnosis not present

## 2012-10-23 MED ORDER — SULFAMETHOXAZOLE-TRIMETHOPRIM 800-160 MG PO TABS: 1.0000 | ORAL_TABLET | Freq: Two times a day (BID) | ORAL | Status: DC

## 2012-10-23 NOTE — Telephone Encounter (Signed)
Called, left voicemail that urine culture showing UTI.  I am sending Rx for Bactrim to pharmacy.   Advised to be sure she takes the whole course.  Advised to call with questions.

## 2012-11-02 DIAGNOSIS — F200 Paranoid schizophrenia: Secondary | ICD-10-CM | POA: Diagnosis not present

## 2012-11-16 ENCOUNTER — Other Ambulatory Visit: Payer: Self-pay | Admitting: Family Medicine

## 2012-11-19 NOTE — Telephone Encounter (Signed)
Patient already has refill of Diltiazem,need to follow up with me for Prednisone refill.

## 2012-11-20 ENCOUNTER — Other Ambulatory Visit: Payer: Self-pay | Admitting: *Deleted

## 2012-11-20 DIAGNOSIS — R531 Weakness: Secondary | ICD-10-CM

## 2012-11-20 MED ORDER — PREDNISONE 10 MG PO TABS: 10.0000 mg | ORAL_TABLET | Freq: Every day | ORAL | Status: DC

## 2012-11-20 MED ORDER — DILTIAZEM HCL ER COATED BEADS 180 MG PO CP24: 180.0000 mg | ORAL_CAPSULE | Freq: Every day | ORAL | Status: DC

## 2012-11-20 NOTE — Telephone Encounter (Signed)
Refill request routed to me as the covering preceptor physician this afternoon.  In reviewing the chart, it appears the patient is prescribed prednisone 10mg  daily for PMR, initiated in April of this year. I am electronically prescribing one-month's worth of the prednisone and diltiazem, patient needs to see new primary physician to establish long-term plan for medication management.  JB

## 2012-11-20 NOTE — Telephone Encounter (Signed)
Patient's daughter calling to request refills of Prednisone and diltiazem.  States pharmacy has been requesting med since last Thursday, but has not heard back from our office yet.  Is now out of meds.  Will route request to preceptor (Dr. Mauricio Po) since Dr. Lum Babe is not here this afternoon.  Daughter needs med refilled before 4:00 pm in order for pharmacy to deliver med.  Will call daughter back.  Gaylene Brooks, RN

## 2012-11-21 ENCOUNTER — Encounter: Payer: Self-pay | Admitting: Family Medicine

## 2012-11-21 ENCOUNTER — Ambulatory Visit (INDEPENDENT_AMBULATORY_CARE_PROVIDER_SITE_OTHER): Payer: Medicare Other | Admitting: Family Medicine

## 2012-11-21 VITALS — BP 153/73 | HR 86 | Temp 97.7°F | Ht 63.0 in | Wt 175.0 lb

## 2012-11-21 DIAGNOSIS — R300 Dysuria: Secondary | ICD-10-CM | POA: Diagnosis not present

## 2012-11-21 DIAGNOSIS — N390 Urinary tract infection, site not specified: Secondary | ICD-10-CM | POA: Diagnosis not present

## 2012-11-21 LAB — POCT URINALYSIS DIPSTICK
Blood, UA: NEGATIVE
Glucose, UA: NEGATIVE
Ketones, UA: NEGATIVE
Spec Grav, UA: 1.015

## 2012-11-21 MED ORDER — CIPROFLOXACIN HCL 500 MG PO TABS: 500.0000 mg | ORAL_TABLET | Freq: Two times a day (BID) | ORAL | Status: DC

## 2012-11-21 NOTE — Progress Notes (Signed)
  Subjective:    Patient ID: Rebecca [name], female    DOB: 02-25-1941, 72 y.o.   MRN: [id number]  HPI  Rebecca [name] comes in with pain with urination x 1 week, but really felt uncomfortable staring yesterday.  She complains of lower abdominal pain and burning with urination.  No nausea, vomiting, back pain or fevers.   Patient has a history of recurrent UTI's.  She saw Urology last winter and had a cystoscopy that was normal.    Review of Systems    See HPI Objective:   Physical Exam BP 153/73  Pulse 86  Temp(Src) 97.7 F (36.5 C) (Oral)  Ht 5\' 3"  (1.6 m)  Wt 175 lb (79.379 kg)  BMI 31.01 kg/m2 General appearance: alert, cooperative and no distress Abdomen: +BS, soft, mild suprapubic TTP.  Pulses: 2+ and symmetric      Assessment & Plan:

## 2012-11-21 NOTE — Assessment & Plan Note (Signed)
Recurrent symptoms- patient has had normal UA's with + cultures in past.  Culture sent.  Will start on Cipro and call patient with culture results. I advised her daughter to call Urology and schedule a follow up appointment.

## 2012-11-21 NOTE — Patient Instructions (Signed)
Your urinalysis today was normal.  However, because of your history I am starting you on an antibiotic until the culture comes back.    Please call your Urologist to make an appointment about your frequent bladder infections.

## 2012-11-22 NOTE — Telephone Encounter (Signed)
Patient has appt to establish care with Dr. Lum Babe on 12/07/12.  Gaylene Brooks, RN

## 2012-11-23 DIAGNOSIS — Z79899 Other long term (current) drug therapy: Secondary | ICD-10-CM | POA: Diagnosis not present

## 2012-11-23 LAB — URINE CULTURE: Colony Count: 7000

## 2012-11-27 ENCOUNTER — Telehealth: Payer: Self-pay | Admitting: Family Medicine

## 2012-11-27 NOTE — Telephone Encounter (Signed)
Left message for pt to call back. Jazmin Hartsell, CMA

## 2012-11-27 NOTE — Telephone Encounter (Signed)
Please cal patient (or her daughter) and let her know her urine culture was negative.  She should STOP taking antibiotics.

## 2012-12-07 ENCOUNTER — Ambulatory Visit (INDEPENDENT_AMBULATORY_CARE_PROVIDER_SITE_OTHER): Payer: Medicare Other | Admitting: Family Medicine

## 2012-12-07 ENCOUNTER — Encounter: Payer: Self-pay | Admitting: Family Medicine

## 2012-12-07 VITALS — BP 104/65 | HR 80 | Temp 98.1°F | Ht 63.0 in | Wt 170.0 lb

## 2012-12-07 DIAGNOSIS — I10 Essential (primary) hypertension: Secondary | ICD-10-CM

## 2012-12-07 DIAGNOSIS — R531 Weakness: Secondary | ICD-10-CM

## 2012-12-07 DIAGNOSIS — E1165 Type 2 diabetes mellitus with hyperglycemia: Secondary | ICD-10-CM | POA: Diagnosis not present

## 2012-12-07 DIAGNOSIS — M353 Polymyalgia rheumatica: Secondary | ICD-10-CM | POA: Diagnosis not present

## 2012-12-07 DIAGNOSIS — E118 Type 2 diabetes mellitus with unspecified complications: Secondary | ICD-10-CM | POA: Diagnosis not present

## 2012-12-07 DIAGNOSIS — R5383 Other fatigue: Secondary | ICD-10-CM | POA: Diagnosis not present

## 2012-12-07 DIAGNOSIS — R5381 Other malaise: Secondary | ICD-10-CM | POA: Diagnosis not present

## 2012-12-07 DIAGNOSIS — J449 Chronic obstructive pulmonary disease, unspecified: Secondary | ICD-10-CM

## 2012-12-07 MED ORDER — PREDNISONE 10 MG PO TABS: 15.0000 mg | ORAL_TABLET | Freq: Every day | ORAL | Status: DC

## 2012-12-07 NOTE — Patient Instructions (Addendum)
Polymyalgia Rheumatica Polymyalgia rheumatica (also called PMR or polymyalgia) is a rheumatologic (arthritic) condition that causes pain and morning stiffness in your neck, shoulders, and hips. It is an inflammatory condition. In some people, inflammation of certain structures in the shoulder, hips, or other joints can be seen on special testing. It does not cause joint destruction, as occurs in other arthritic conditions. It usually occurs after 74 years of age, and is more common as you age. It can be confused with several other diseases, but it is usually easily treated. People with PMR often have, or can develop, a more severe rheumatologic condition called giant cell arteritis (also called CGA or temporal arteritis).  CAUSES  The exact cause of PMR is not known.   There are genetic factors involved.  Viruses have been suspected in the cause of PMR. This has not been proven. SYMPTOMS   Aching, pain, and morning stiffness your neck, both shoulders, or both hips.  Symptoms usually start slowly and build gradually.  Morning stiffness usually lasts at least 30 minutes.  Swelling and tenderness in other joints of the arms, hands, legs, and feet may occur.  Swelling and inflammation in the wrists can cause nerve inflammation at the wrist (carpal tunnel syndrome).  You may also have low grade fever, fatigue, weakness, decreased appetite and weight loss. DIAGNOSIS   Your caregiver may suspect that you have PMR based on your description of your symptoms and on your exam.  Your caregiver will examine you to be sure you do not have diseases that can be confused with PMR. These diseases include rheumatoid arthritis, fibromyalgia, or thyroid disease.  Your caregiver should check for signs of giant cell arteritis. This can cause serious complications such as blindness.  Lab tests can help confirm that you have PMR and not other diseases, but are sometimes inconclusive.  X-rays cannot show PMR.  However, it can identify other diseases like rheumatoid arthritis. Your caregiver may have you see a specialist in arthritis and inflammatory diseases (rheumatologist). TREATMENT  The goal of treatment is relief of symptoms. Treatment does not shorten the course of the illness or prevent complications. With proper treatment, you usually feel better almost right away.   The initial treatment of PMR is usually a cortisone (steroid) medication. Your caregiver will help determine a starting dose. The dose is gradually reduced every few weeks to months. Treatment usually lasts one to three years.  Other stronger medications are rarely needed. They will only be prescribed if your symptoms do not get better on cortisone medication alone, or if they recur as the dose is reduced.  Cortisone medication can have different side effects. With the doses of cortisone needed for PMR, the side effects can affect bones and joints, blood sugar control in diabetes, and mood changes. Discuss this with your caregiver.  Your caregiver will evaluate you regularly during your treatment. They will do this in order to assess progress and to check for complications of the illness or treatment.  Physical therapy is sometimes useful. This is especially true if your joints are still stiff after other symptoms have improved. HOME CARE INSTRUCTIONS   Follow your caregiver's instructions. Do not change your dose of cortisone medication on your own.  Keep your appointments for follow-up lab tests and caregiver visits. Your lab tests need to be monitored. You must get checked periodically for giant cell arteritis.  Follow your caregiver's guidance regarding physical activity (usually no restrictions are needed) or physical therapy.  Your caregiver   may have instructions to prevent or check for side effects from cortisone medication (including bone density testing or treatment). Follow their instructions carefully. SEEK MEDICAL  CARE IF:   You develop any side effects from treatment. Side effects can include:  Elevated blood pressure.  High blood sugar (or worsening of diabetes, if you are diabetic).  Difficulty fighting off infections.  Weight gain.  Weakness of the bones (osteoporosis).  Your aches, pains, morning stiffness, or other symptoms get worse with time. This is especially true after your dose of cortisone is reduced.  You develop new joint symptoms (pain, swelling, etc.) SEEK IMMEDIATE MEDICAL CARE IF:   You develop a severe headache.  You start vomiting.  You have problems with your vision.  You have an oral temperature above 102 F (38.9 C), not controlled by medicine. Document Released: 07/08/2004 Document Revised: 05/17/2012 Document Reviewed: 10/21/2008 Exit[employer] Patient Information 2014 [employer].  

## 2012-12-07 NOTE — Progress Notes (Signed)
Subjective:     Patient ID: Rebecca [name], female   DOB: Mar 29, 1941, 72 y.o.   MRN: [id number]  HPI PMR: She did well on Prednisone 10 mg daily for few weeks and then she started having morning stiffness and pain in both her shoulders,she does not feel the steroid is helping as much as before,she uses Tramadol prn pain which helps a little as well. Cough/COPD: Patient has been coughing a little for the last few days,she sound congested,no SOB,some wheezing from the throat.No fever,no sick contact. DM/HTN:She is compliant with her BP medication,currently on diet control for her DM,her home glucose level are high at times,but she tries to cut back on high carb diet.  Current Outpatient Prescriptions on File Prior to Visit  Medication Sig Dispense Refill  . ACCU-CHEK AVIVA PLUS test strip TEST BLOOD GLUCOSE ONCE ADAY  50 each  4  . acetaminophen (TYLENOL) 325 MG tablet Take 650 mg by mouth every 8 (eight) hours as needed. For pain      . albuterol (PROVENTIL HFA;VENTOLIN HFA) 108 (90 BASE) MCG/ACT inhaler Inhale 1-2 puffs into the lungs every 4 (four) hours as needed. For shortness of breath  1 Inhaler  2  . aspirin EC 81 MG tablet Take 162 mg by mouth daily.      . Blood Glucose Monitoring Suppl (ACCU-CHEK AVIVA PLUS) W/DEVICE KIT 1 each by Does not apply route 4 (four) times daily -  before meals and at bedtime.  1 kit  0  . Calcium Carbonate-Vitamin D (CALCIUM 600+D) 600-400 MG-UNIT per tablet Take 1 tablet by mouth daily.  30 tablet  11  . clozapine (CLOZARIL) 100 MG tablet Take 300 mg by mouth at bedtime.       . Cranberry 500 MG CAPS Take 500 mg by mouth daily.      Marland Kitchen diltiazem (CARDIZEM CD) 180 MG 24 hr capsule Take 1 capsule (180 mg total) by mouth daily.  30 capsule  0  . docusate sodium (COLACE) 100 MG capsule Take 1 capsule (100 mg total) by mouth 2 (two) times daily.  10 capsule  0  . ferrous sulfate 325 (65 FE) MG EC tablet Take 325 mg by mouth 2 (two) times daily.        .  Fluticasone-Salmeterol (ADVAIR DISKUS) 250-50 MCG/DOSE AEPB Inhale 1 puff into the lungs 2 (two) times daily.  60 each  2  . isosorbide mononitrate (IMDUR) 60 MG 24 hr tablet Take 1 tablet (60 mg total) by mouth daily.  60 tablet  11  . Lancets MISC For checking cbg once daily  100 each  11  . levothyroxine (SYNTHROID, LEVOTHROID) 50 MCG tablet Take 1 tablet (50 mcg total) by mouth daily.  30 tablet  11  . metoprolol succinate (TOPROL-XL) 100 MG 24 hr tablet Take 1 tablet (100 mg total) by mouth daily. Take with or immediately following a meal.  30 tablet  11  . Multiple Vitamin (MULTIVITAMIN WITH MINERALS) TABS Take 1 tablet by mouth daily. Oceanographer      . nitroGLYCERIN (NITROSTAT) 0.4 MG SL tablet Place 1 tablet (0.4 mg total) under the tongue every 5 (five) minutes as needed. For chest pain  10 tablet  0  . Omega-3 Fatty Acids (FISH OIL) 1000 MG CAPS Take 1 capsule (1,000 mg total) by mouth daily.      . polyethylene glycol powder (GLYCOLAX/MIRALAX) powder Take 17 g by mouth 2 (two) times daily as needed.  3350 g  11  . pravastatin (PRAVACHOL) 20 MG tablet Take 1 tablet (20 mg total) by mouth at bedtime.  30 tablet  11  . traMADol (ULTRAM) 50 MG tablet Take 1 tablet (50 mg total) by mouth 2 (two) times daily as needed. For severe pain  60 tablet  11   No current facility-administered medications on file prior to visit.   Past Medical History  Diagnosis Date  . CHF (congestive heart failure)   . Diabetes mellitus   . S/P laparoscopic cholecystectomy   . Frozen shoulder     right  . Obesity   . Renal insufficiency   . COPD (chronic obstructive pulmonary disease)   . Shortness of breath   . Stroke       Review of Systems  Respiratory: Positive for cough. Negative for shortness of breath.   Cardiovascular: Negative.   Gastrointestinal: Negative.   Genitourinary: Negative.   Musculoskeletal: Positive for arthralgias. Negative for joint swelling and gait problem.  All other  systems reviewed and are negative.   Filed Vitals:   12/07/12 1337  BP: 104/65  Pulse: 80  Temp: 98.1 F (36.7 C)  TempSrc: Oral  Height: 5\' 3"  (1.6 m)  Weight: 170 lb (77.111 kg)       Objective:   Physical Exam  Nursing note and vitals reviewed. Constitutional: She appears well-developed. No distress.  Cardiovascular: Normal rate, regular rhythm, normal heart sounds and intact distal pulses.   No murmur heard. Pulmonary/Chest: Effort normal and breath sounds normal. No respiratory distress. She has no wheezes. She exhibits no tenderness.  Abdominal: Soft. Bowel sounds are normal. She exhibits no mass. There is no tenderness. There is no guarding.  Musculoskeletal: Normal range of motion. She exhibits no edema.       Right shoulder: Normal.       Left shoulder: Normal.  Sensory exam of the foot is normal, tested with the monofilament. Good pulses, no lesions or ulcers, good peripheral pulses. Small cut on left 5th toe ( was cutting her toe nail)       Assessment/Plan:

## 2012-12-07 NOTE — Assessment & Plan Note (Signed)
Not doing well on 10 mg Prednisone. Patient will like to go up on dose. I suggested may go up to 15 mg daily,if no response to a dose increment,there is no need for further increment,we will gradually taper her off Prednisone. I discussed s/e of prednisone with patient,she and her daughter are aware of this. Continue Tramadol prn pain. F/U in 4 wks for reassessment.

## 2012-12-07 NOTE — Assessment & Plan Note (Signed)
BP optimal. Continue current regimen. 

## 2012-12-07 NOTE — Assessment & Plan Note (Signed)
COPD with mild cough Respiratory exam completely benign. Continue Advair and albuterol prn, May use OTC cough syrup prn. F/U soon if symptom persist.

## 2012-12-07 NOTE — Assessment & Plan Note (Signed)
Diet controlled DM. As discussed with patient steroid use may worsen her DM,she is aware of this. If prednisone is no more beneficial,we agreed to gradually taper her off steroid. Continue diet control for now.

## 2012-12-12 ENCOUNTER — Telehealth: Payer: Self-pay | Admitting: Family Medicine

## 2012-12-12 NOTE — Telephone Encounter (Signed)
According to immunizations documented in Epic last Td was in 2011.  Does pt need to get another shot or will this cover her?  Please advise.  Ashliegh Parekh,  Kimberly Nieland

## 2012-12-12 NOTE — Telephone Encounter (Signed)
Mother is concerned about her mother who banged the back of her hand and cut it on the spicket in the bathroom. It does not hurt and it does not look infected. She has kept clean and bandage. She just wanted to know about her tetanus shot if it is up to date or does she need to come in for one? If she doesn't answer main number you can reach at [phone] which is her cell.

## 2012-12-12 NOTE — Telephone Encounter (Signed)
TD is good for 24yrs as long as her wound is clean.

## 2012-12-13 NOTE — Telephone Encounter (Signed)
Message left for daughter advising her to keep wound clean and if there are any changes to call and make an appt for someone to look at it.  [name],  Rebecca

## 2012-12-19 DIAGNOSIS — Z79899 Other long term (current) drug therapy: Secondary | ICD-10-CM | POA: Diagnosis not present

## 2012-12-21 ENCOUNTER — Other Ambulatory Visit: Payer: Self-pay

## 2013-01-02 ENCOUNTER — Encounter: Payer: Self-pay | Admitting: Family Medicine

## 2013-01-02 ENCOUNTER — Ambulatory Visit (INDEPENDENT_AMBULATORY_CARE_PROVIDER_SITE_OTHER): Payer: Medicare Other | Admitting: Family Medicine

## 2013-01-02 VITALS — BP 123/52 | HR 82 | Temp 98.5°F | Wt 162.0 lb

## 2013-01-02 DIAGNOSIS — R5383 Other fatigue: Secondary | ICD-10-CM

## 2013-01-02 DIAGNOSIS — Z1239 Encounter for other screening for malignant neoplasm of breast: Secondary | ICD-10-CM

## 2013-01-02 DIAGNOSIS — M545 Low back pain, unspecified: Secondary | ICD-10-CM | POA: Diagnosis not present

## 2013-01-02 DIAGNOSIS — Z13820 Encounter for screening for osteoporosis: Secondary | ICD-10-CM | POA: Diagnosis not present

## 2013-01-02 DIAGNOSIS — I10 Essential (primary) hypertension: Secondary | ICD-10-CM

## 2013-01-02 DIAGNOSIS — R531 Weakness: Secondary | ICD-10-CM

## 2013-01-02 DIAGNOSIS — Z780 Asymptomatic menopausal state: Secondary | ICD-10-CM

## 2013-01-02 DIAGNOSIS — E118 Type 2 diabetes mellitus with unspecified complications: Secondary | ICD-10-CM | POA: Diagnosis not present

## 2013-01-02 DIAGNOSIS — E1165 Type 2 diabetes mellitus with hyperglycemia: Secondary | ICD-10-CM

## 2013-01-02 DIAGNOSIS — M353 Polymyalgia rheumatica: Secondary | ICD-10-CM | POA: Diagnosis not present

## 2013-01-02 DIAGNOSIS — R5381 Other malaise: Secondary | ICD-10-CM | POA: Diagnosis not present

## 2013-01-02 DIAGNOSIS — T148XXA Other injury of unspecified body region, initial encounter: Secondary | ICD-10-CM

## 2013-01-02 LAB — POCT GLYCOSYLATED HEMOGLOBIN (HGB A1C): Hemoglobin A1C: 5.5

## 2013-01-02 MED ORDER — PREDNISONE 2.5 MG PO TABS: ORAL_TABLET | ORAL | Status: DC

## 2013-01-02 NOTE — Assessment & Plan Note (Signed)
Likely related to steroid. She is instructed to taper off prednisone. If bruising persist after d/c steroid will work up for bleeding disorder.

## 2013-01-02 NOTE — Assessment & Plan Note (Signed)
A1C checked today 5.5 She continues to do well and at this point she might not have a diagnosis of DM. Recheck A1C in 6 month,if normal may check yearly. Continue diet and exercise control.

## 2013-01-02 NOTE — Addendum Note (Signed)
Addended by: Damita Lack on: 01/02/2013 04:41 PM   Modules accepted: Orders

## 2013-01-02 NOTE — Assessment & Plan Note (Signed)
BP optimal. Continue Metoprolol.

## 2013-01-02 NOTE — Patient Instructions (Signed)
Hip Exercises  RANGE OF MOTION (ROM) AND STRETCHING EXERCISES   These exercises may help you when beginning to rehabilitate your injury. Doing them too aggressively can worsen your condition. Complete them slowly and gently. Your symptoms may resolve with or without further involvement from your physician, physical therapist or athletic trainer. While completing these exercises, remember:   · Restoring tissue flexibility helps normal motion to return to the joints. This allows healthier, less painful movement and activity.  · An effective stretch should be held for at least 30 seconds.  · A stretch should never be painful. You should only feel a gentle lengthening or release in the stretched tissue. If these stretches worsen your symptoms even when done gently, consult your physician, physical therapist or athletic trainer.  STRETCH Hamstrings, Supine   · Lie on your back. Loop a belt or towel over the ball of your right / left foot.  · Straighten your right / left knee and slowly pull on the belt to raise your leg. Do not allow the right / left knee to bend. Keep your opposite leg flat on the floor.  · Raise the leg until you feel a gentle stretch behind your right / left knee or thigh. Hold this position for __________ seconds.  Repeat __________ times. Complete this stretch __________ times per day.   STRETCH - Hip Rotators   · Lie on your back on a firm surface. Grasp your right / left knee with your right / left hand and your ankle with your opposite hand.  · Keeping your hips and shoulders firmly planted, gently pull your right / left knee and rotate your lower leg toward your opposite shoulder until you feel a stretch in your buttocks.  · Hold this stretch for __________ seconds.  Repeat this stretch __________ times. Complete this stretch __________ times per day.  STRETCH - Hamstrings/Adductors, V-Sit   · Sit on the floor with your legs extended in a large "V," keeping your knees straight.  · With your head  and chest upright, bend at your waist reaching for your right foot to stretch your left adductors.  · You should feel a stretch in your left inner thigh. Hold for __________ seconds.  · Return to the upright position to relax your leg muscles.  · Continuing to keep your chest upright, bend straight forward at your waist to stretch your hamstrings.  · You should feel a stretch behind both of your thighs and/or knees. Hold for __________ seconds.  · Return to the upright position to relax your leg muscles.  · Repeat steps 2 through 4 for opposite leg.  Repeat __________ times. Complete this exercise __________ times per day.   STRETCHING - Hip Flexors, Lunge  · Half kneel with your right / left knee on the floor and your opposite knee bent and directly over your ankle.  · Keep good posture with your head over your shoulders. Tighten your buttocks to point your tailbone downward; this will prevent your back from arching too much.  · You should feel a gentle stretch in the front of your thigh and/or hip. If you do not feel any resistance, slightly slide your opposite foot forward and then slowly lunge forward so your knee once again lines up over your ankle. Be sure your tailbone remains pointed downward.  · Hold this stretch for __________ seconds.  Repeat __________ times. Complete this stretch __________ times per day.  STRENGTHENING EXERCISES  These exercises may help you   when beginning to rehabilitate your injury. They may resolve your symptoms with or without further involvement from your physician, physical therapist or athletic trainer. While completing these exercises, remember:   · Muscles can gain both the endurance and the strength needed for everyday activities through controlled exercises.  · Complete these exercises as instructed by your physician, physical therapist or athletic trainer. Progress the resistance and repetitions only as guided.  · You may experience muscle soreness or fatigue, but the pain  or discomfort you are trying to eliminate should never worsen during these exercises. If this pain does worsen, stop and make certain you are following the directions exactly. If the pain is still present after adjustments, discontinue the exercise until you can discuss the trouble with your clinician.  STRENGTH - Hip Extensors, Bridge   · Lie on your back on a firm surface. Bend your knees and place your feet flat on the floor.  · Tighten your buttocks muscles and lift your bottom off the floor until your trunk is level with your thighs. You should feel the muscles in your buttocks and back of your thighs working. If you do not feel these muscles, slide your feet 1-2 inches further away from your buttocks.  · Hold this position for __________ seconds.  · Slowly lower your hips to the starting position and allow your buttock muscles relax completely before beginning the next repetition.  · If this exercise is too easy, you may cross your arms over your chest.  Repeat __________ times. Complete this exercise __________ times per day.   STRENGTH - Hip Abductors, Straight Leg Raises   Be aware of your form throughout the entire exercise so that you exercise the correct muscles. Sloppy form means that you are not strengthening the correct muscles.  · Lie on your side so that your head, shoulders, knee and hip line up. You may bend your lower knee to help maintain your balance. Your right / left leg should be on top.  · Roll your hips slightly forward, so that your hips are stacked directly over each other and your right / left knee is facing forward.  · Lift your top leg up 4-6 inches, leading with your heel. Be sure that your foot does not drift forward or that your knee does not roll toward the ceiling.  · Hold this position for __________ seconds. You should feel the muscles in your outer hip lifting (you may not notice this until your leg begins to tire).  · Slowly lower your leg to the starting position. Allow the  muscles to fully relax before beginning the next repetition.  Repeat __________ times. Complete this exercise __________ times per day.   STRENGTH - Hip Adductors, Straight Leg Raises   · Lie on your side so that your head, shoulders, knee and hip line up. You may place your upper foot in front to help maintain your balance. Your right / left leg should be on the bottom.  · Roll your hips slightly forward, so that your hips are stacked directly over each other and your right / left knee is facing forward.  · Tense the muscles in your inner thigh and lift your bottom leg 4-6 inches. Hold this position for __________ seconds.  · Slowly lower your leg to the starting position. Allow the muscles to fully relax before beginning the next repetition.  Repeat __________ times. Complete this exercise __________ times per day.   STRENGTH - Quadriceps, Straight Leg Raises     Quality counts! Watch for signs that the quadriceps muscle is working to insure you are strengthening the correct muscles and not "cheating" by substituting with healthier muscles.  · Lay on your back with your right / left leg extended and your opposite knee bent.  · Tense the muscles in the front of your right / left thigh. You should see either your knee cap slide up or increased dimpling just above the knee. Your thigh may even quiver.  · Tighten these muscles even more and raise your leg 4 to 6 inches off the floor. Hold for right / left seconds.  · Keeping these muscles tense, lower your leg.  · Relax the muscles slowly and completely in between each repetition.  Repeat __________ times. Complete this exercise __________ times per day.   STRENGTH - Hip Abductors, Standing  · Tie one end of a rubber exercise band/tubing to a secure surface (table, pole) and tie a loop at the other end.  · Place the loop around your right / left ankle. Keeping your ankle with the band directly opposite of the secured end, step away until there is tension in the  tube/band.  · Hold onto a chair as needed for balance.  · Keeping your back upright, your shoulders over your hips, and your toes pointing forward, lift your right / left leg out to your side. Be sure to lift your leg with your hip muscles. Do not "throw" your leg or tip your body to lift your leg.  · Slowly and with control, return to the starting position.  Repeat exercise __________ times. Complete this exercise __________ times per day.   STRENGTH  Quadriceps, Squats  · Stand in a door frame so that your feet and knees are in line with the frame.  · Use your hands for balance, not support, on the frame.  · Slowly lower your weight, bending at the hips and knees. Keep your lower legs upright so that they are parallel with the door frame. Squat only within the range that does not increase your knee pain. Never let your hips drop below your knees.  · Slowly return upright, pushing with your legs, not pulling with your hands.  Document Released: 06/18/2005 Document Revised: 08/23/2011 Document Reviewed: 09/12/2008  [employer]® Patient Information ©2014 [employer].

## 2013-01-02 NOTE — Assessment & Plan Note (Signed)
Mammogram ordered

## 2013-01-02 NOTE — Assessment & Plan Note (Signed)
Dexa scan ordered. 

## 2013-01-02 NOTE — Assessment & Plan Note (Signed)
Steroid does not seem to be beneficial and it is causing more s/e than good. Patient and daughter instructed to taper off steroid. Instruction to taper given. Continue tramadol prn pain. PT and pool therapy recommended. Referral to PT done.

## 2013-01-02 NOTE — Assessment & Plan Note (Signed)
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 Tramadol prn pain. PT referral done.

## 2013-01-02 NOTE — Progress Notes (Signed)
Subjective:     Patient ID: Rebecca [name], female   DOB: 1940/10/08, 72 y.o.   MRN: [id number]  HPI PMR/Back pain:Patient is here for follow up after last visit when her prednisone dose was increased to 15 mg qd,her daughter stated she developed s/e such as concentration problem after 1 wk of dose increase,hence she went back to 10 mg,she did not notice any improvement with higher dose of steroid either,she continues to have b/l hip and shoulder pain as well as low back pain which she controls with Tramadol prn.She denies any recent fall or trauma,she had xray of her back in the past. Bruising: Since she started steroid she has been having bruising on her skin, DM/HTN:Patient is not on medication for DM,she stated she had never been on medication,her daughter has been helping her with her diet.she is compliant with her Metoprolol for her BP which has always been good. Health maintenance:She is yet to get mammogram done,she had colonoscopy done.Need Dexa.  Current Outpatient Prescriptions on File Prior to Visit  Medication Sig Dispense Refill  . ACCU-CHEK AVIVA PLUS test strip TEST BLOOD GLUCOSE ONCE ADAY  50 each  4  . acetaminophen (TYLENOL) 325 MG tablet Take 650 mg by mouth every 8 (eight) hours as needed. For pain      . albuterol (PROVENTIL HFA;VENTOLIN HFA) 108 (90 BASE) MCG/ACT inhaler Inhale 1-2 puffs into the lungs every 4 (four) hours as needed. For shortness of breath  1 Inhaler  2  . aspirin EC 81 MG tablet Take 162 mg by mouth daily.      . Blood Glucose Monitoring Suppl (ACCU-CHEK AVIVA PLUS) W/DEVICE KIT 1 each by Does not apply route 4 (four) times daily -  before meals and at bedtime.  1 kit  0  . Calcium Carbonate-Vitamin D (CALCIUM 600+D) 600-400 MG-UNIT per tablet Take 1 tablet by mouth daily.  30 tablet  11  . clozapine (CLOZARIL) 100 MG tablet Take 300 mg by mouth at bedtime.       . Cranberry 500 MG CAPS Take 500 mg by mouth daily.      Marland Kitchen diltiazem (CARDIZEM CD) 180 MG 24 hr  capsule Take 1 capsule (180 mg total) by mouth daily.  30 capsule  0  . ferrous sulfate 325 (65 FE) MG EC tablet Take 325 mg by mouth 2 (two) times daily.        . Fluticasone-Salmeterol (ADVAIR DISKUS) 250-50 MCG/DOSE AEPB Inhale 1 puff into the lungs 2 (two) times daily.  60 each  2  . isosorbide mononitrate (IMDUR) 60 MG 24 hr tablet Take 1 tablet (60 mg total) by mouth daily.  60 tablet  11  . Lancets MISC For checking cbg once daily  100 each  11  . levothyroxine (SYNTHROID, LEVOTHROID) 50 MCG tablet Take 1 tablet (50 mcg total) by mouth daily.  30 tablet  11  . metoprolol succinate (TOPROL-XL) 100 MG 24 hr tablet Take 1 tablet (100 mg total) by mouth daily. Take with or immediately following a meal.  30 tablet  11  . Multiple Vitamin (MULTIVITAMIN WITH MINERALS) TABS Take 1 tablet by mouth daily. Oceanographer      . Omega-3 Fatty Acids (FISH OIL) 1000 MG CAPS Take 1 capsule (1,000 mg total) by mouth daily.      . pantoprazole (PROTONIX) 20 MG tablet Take 40 mg by mouth daily.      . polyethylene glycol powder (GLYCOLAX/MIRALAX) powder Take 17 g by  mouth 2 (two) times daily as needed.  3350 g  11  . pravastatin (PRAVACHOL) 20 MG tablet Take 1 tablet (20 mg total) by mouth at bedtime.  30 tablet  11  . predniSONE (DELTASONE) 10 MG tablet Take 1.5 tablets (15 mg total) by mouth daily.  45 tablet  0  . traMADol (ULTRAM) 50 MG tablet Take 1 tablet (50 mg total) by mouth 2 (two) times daily as needed. For severe pain  60 tablet  11  . docusate sodium (COLACE) 100 MG capsule Take 1 capsule (100 mg total) by mouth 2 (two) times daily.  10 capsule  0  . nitroGLYCERIN (NITROSTAT) 0.4 MG SL tablet Place 1 tablet (0.4 mg total) under the tongue every 5 (five) minutes as needed. For chest pain  10 tablet  0   No current facility-administered medications on file prior to visit.   Past Medical History  Diagnosis Date  . CHF (congestive heart failure)   . Diabetes mellitus   . S/P laparoscopic  cholecystectomy   . Frozen shoulder     right  . Obesity   . Renal insufficiency   . COPD (chronic obstructive pulmonary disease)   . Shortness of breath   . Stroke     Review of Systems  Constitutional: Positive for fatigue. Negative for appetite change and unexpected weight change.  Respiratory: Negative.   Cardiovascular: Negative.   Gastrointestinal: Negative.   Endocrine: Negative for polyuria.  Musculoskeletal: Positive for back pain and arthralgias. Negative for joint swelling.  Skin:       Bruising.  Neurological: Negative for dizziness.  Hematological: Bruises/bleeds easily.  All other systems reviewed and are negative.       Objective:   Physical Exam  Nursing note and vitals reviewed. Constitutional: She is oriented to person, place, and time. She appears well-developed. No distress.  Eyes: Pupils are equal, round, and reactive to light.  Neck: Neck supple.  Cardiovascular: Normal rate, regular rhythm, normal heart sounds and intact distal pulses.   No murmur heard. Pulmonary/Chest: Effort normal and breath sounds normal. No respiratory distress. She has no wheezes.  Abdominal: Soft. Bowel sounds are normal. She exhibits no distension and no mass. There is no tenderness.  Musculoskeletal: Normal range of motion. She exhibits no edema.       Right shoulder: Normal.       Left shoulder: Normal.       Right hip: Normal.       Left hip: Normal.       Lumbar back: Normal.  Neurological: She is alert and oriented to person, place, and time.  Skin: Bruising noted.     Psychiatric: She has a normal mood and affect.       Assessment/Plan:     Please check problem list for A/P.     More than 45 min spent on face to face encounter and coordination of care for this visit.

## 2013-01-05 ENCOUNTER — Other Ambulatory Visit: Payer: Self-pay | Admitting: Family Medicine

## 2013-01-10 ENCOUNTER — Ambulatory Visit: Payer: Medicare Other

## 2013-01-16 ENCOUNTER — Telehealth: Payer: Self-pay | Admitting: Family Medicine

## 2013-01-16 DIAGNOSIS — R531 Weakness: Secondary | ICD-10-CM

## 2013-01-16 MED ORDER — PREDNISONE 10 MG PO TABS: 10.0000 mg | ORAL_TABLET | Freq: Every day | ORAL | Status: DC

## 2013-01-16 NOTE — Telephone Encounter (Signed)
The patients daughter is calling because the pharmacy has not gotten a response to the refill request for Prednisone from 7/25 and she is concerned that the patient will run out of her medication.  She is not out but she is running low.  Also, Dr. Lum Babe wanted her to taper her off of the Prednisone, but her mom has not been able to tolerate it, so she is requesting the 10mg  at least until her next appt at the end of the month.

## 2013-01-16 NOTE — Telephone Encounter (Signed)
I spoke with her daughter,she tried to paper her off the prednisone as instructed but did not tolerate it well,she did well when she got to 10 mg dose,will like for her to continue taking steroid for her pain. I refilled steroid 10 mg for now till her next appointment.

## 2013-01-16 NOTE — Telephone Encounter (Signed)
Will fwd to Md.  Levada Bowersox L, CMA  

## 2013-01-26 DIAGNOSIS — I2510 Atherosclerotic heart disease of native coronary artery without angina pectoris: Secondary | ICD-10-CM | POA: Diagnosis not present

## 2013-01-26 DIAGNOSIS — Z79899 Other long term (current) drug therapy: Secondary | ICD-10-CM | POA: Diagnosis not present

## 2013-01-26 DIAGNOSIS — E782 Mixed hyperlipidemia: Secondary | ICD-10-CM | POA: Diagnosis not present

## 2013-01-29 ENCOUNTER — Other Ambulatory Visit: Payer: Self-pay | Admitting: Family Medicine

## 2013-01-30 ENCOUNTER — Telehealth: Payer: Self-pay | Admitting: Family Medicine

## 2013-01-30 MED ORDER — FLUTICASONE-SALMETEROL 250-50 MCG/DOSE IN AEPB: 1.0000 | INHALATION_SPRAY | Freq: Two times a day (BID) | RESPIRATORY_TRACT | Status: DC

## 2013-01-30 NOTE — Telephone Encounter (Signed)
Left detailed message for daughter.  Bera Pinela,CMA

## 2013-01-30 NOTE — Telephone Encounter (Signed)
I refilled Advair with correct instruction. i will discuss her prednisone dose at next visit.

## 2013-01-30 NOTE — Telephone Encounter (Signed)
Daughter Rebecca [name] called wanting some advice and medication management for her mother Rebecca [name]. He mother uses [facility]  And she is out, she did go to the pharmacy and they will send [place] a request for refill but her issue is that she was told by Dr. Earnest Bailey to give her mother 1 puff in  The a.m. and then one puff in the p.m.  Her main issue is that the prescription on the bottle says to do only one puff a day. She would like it to say one in the morning and one in the evening.The second issue is the Prednisone 10 mg is suppose to given on one day then none the next day. She found that this is not a good option for her mother and her mother does need it everyday. So she gives her mother 10 mg one day then the next day she gives her about 7.5 mg and this really does help. She would like a prescription of 1 mg prednisone to be sent to her mother's pharmacy because it would be easier to cut the 10 mg in half and add the 1 mg to that to help control the exact dosage.  SHe said if you get no answer you could call her on her cell at [phone] . JW

## 2013-01-30 NOTE — Telephone Encounter (Signed)
Will forward to MD. Jazmin Hartsell,CMA  

## 2013-02-01 ENCOUNTER — Telehealth: Payer: Self-pay | Admitting: Cardiovascular Disease

## 2013-02-01 ENCOUNTER — Other Ambulatory Visit: Payer: Self-pay | Admitting: *Deleted

## 2013-02-01 NOTE — Telephone Encounter (Signed)
Returned call to [place], pt's daughter.  Stated Dr. Alanda Amass told pt to d/c Imdur and she did.  Stated last night pt c/o feeling her heart skip a beat and take her breath away.  Stated that is what pt had when she was started on Imdur and she wants to know if pt needs to be tapered down before stopping.  Asked if pt needs to take 30mg  now.  Informed Dr. Alanda Amass will be notified.  Verbalized understanding.  Message forwarded to [facility]. Berlinda Last, LPN to discuss w/ Dr. Alanda Amass.  This note and paper chart# [id number] placed on Dr. Kandis Cocking cart.

## 2013-02-01 NOTE — Telephone Encounter (Signed)
Please call-question about her Imdur.having some problems.If you call her after 4-please call her on this number-[phone].

## 2013-02-02 ENCOUNTER — Other Ambulatory Visit: Payer: Self-pay | Admitting: Cardiovascular Disease

## 2013-02-02 DIAGNOSIS — R6889 Other general symptoms and signs: Secondary | ICD-10-CM | POA: Diagnosis not present

## 2013-02-02 DIAGNOSIS — R5381 Other malaise: Secondary | ICD-10-CM | POA: Diagnosis not present

## 2013-02-02 DIAGNOSIS — E782 Mixed hyperlipidemia: Secondary | ICD-10-CM | POA: Diagnosis not present

## 2013-02-02 LAB — COMPREHENSIVE METABOLIC PANEL
Albumin: 3.9 g/dL (ref 3.5–5.2)
BUN: 32 mg/dL — ABNORMAL HIGH (ref 6–23)
Calcium: 9.8 mg/dL (ref 8.4–10.5)
Chloride: 103 mEq/L (ref 96–112)
Glucose, Bld: 109 mg/dL — ABNORMAL HIGH (ref 70–99)
Potassium: 4.5 mEq/L (ref 3.5–5.3)

## 2013-02-02 LAB — LIPID PANEL
Cholesterol: 136 mg/dL (ref 0–200)
HDL: 55 mg/dL (ref >39)
Triglycerides: 110 mg/dL (ref <150)

## 2013-02-02 LAB — TSH: TSH: 3.144 u[IU]/mL (ref 0.350–4.500)

## 2013-02-02 MED ORDER — ACCU-CHEK SOFTCLIX LANCETS MISC: Status: DC

## 2013-02-04 NOTE — Telephone Encounter (Signed)
Pt. Called and message left that it was ok for her to restart her imdur 30mg .

## 2013-02-05 MED ORDER — ISOSORBIDE MONONITRATE ER 30 MG PO TB24: 30.0000 mg | ORAL_TABLET | Freq: Every day | ORAL | Status: DC

## 2013-02-05 NOTE — Telephone Encounter (Signed)
Her Isosorbide was changed to 30 mg and she was taking 60 mg-Needs a new prescription for this-please call to [facility] and [address].

## 2013-02-05 NOTE — Telephone Encounter (Signed)
Refill(s) sent to pharmacy via [employer].  

## 2013-02-06 ENCOUNTER — Ambulatory Visit (INDEPENDENT_AMBULATORY_CARE_PROVIDER_SITE_OTHER): Payer: Medicare Other | Admitting: Family Medicine

## 2013-02-06 ENCOUNTER — Encounter: Payer: Self-pay | Admitting: Family Medicine

## 2013-02-06 VITALS — BP 116/70 | HR 80 | Temp 98.0°F | Wt 164.0 lb

## 2013-02-06 DIAGNOSIS — E1165 Type 2 diabetes mellitus with hyperglycemia: Secondary | ICD-10-CM

## 2013-02-06 DIAGNOSIS — E2839 Other primary ovarian failure: Secondary | ICD-10-CM

## 2013-02-06 DIAGNOSIS — R5381 Other malaise: Secondary | ICD-10-CM | POA: Diagnosis not present

## 2013-02-06 DIAGNOSIS — M353 Polymyalgia rheumatica: Secondary | ICD-10-CM

## 2013-02-06 DIAGNOSIS — R531 Weakness: Secondary | ICD-10-CM

## 2013-02-06 DIAGNOSIS — I10 Essential (primary) hypertension: Secondary | ICD-10-CM

## 2013-02-06 MED ORDER — PREDNISONE 10 MG PO TABS: 10.0000 mg | ORAL_TABLET | Freq: Every day | ORAL | Status: DC

## 2013-02-06 NOTE — Assessment & Plan Note (Signed)
Patient not doing well with steroid tapering. I will have her get back on Prednisone 10 mg daily. I refilled her medication. If not doing well on this dose,I will refer to Rheumatologist for second opinion prior to increasing dose of steroid further.

## 2013-02-06 NOTE — Assessment & Plan Note (Signed)
Dexa scan ordered. 

## 2013-02-06 NOTE — Assessment & Plan Note (Addendum)
Diet controlled. I refilled her Accu check Aviva plus test strips and lancet. Diet counseling given. Referred to podiatry for foot care.

## 2013-02-06 NOTE — Assessment & Plan Note (Signed)
Blood pressure is well controlled 

## 2013-02-06 NOTE — Progress Notes (Signed)
Subjective:     Patient ID: Rebecca [name], female   DOB: [birth year], 72 y.o.   MRN: [id number]  HPI Polymyalgia rheumatica:Patient is not doing well with prednisone tapering,she was doing well on 10 mg daily,here for follow up today,continues to have shoulder and hip pain. WU:JWJX control, as per note from her daughter ( Here with son today),she has not been doing well with her diet. She need refill of her glucometer lancet and strips. BJY:[id number] with her medication,here for follow up. Estrogen deficiency:Has not gotten Dexa Scan done.She is postmenopausal on steroid with joint and bone pain.  Current Outpatient Prescriptions on File Prior to Visit  Medication Sig Dispense Refill  . ACCU-CHEK AVIVA PLUS test strip TEST BLOOD GLUCOSE ONCE ADAY  50 each  4  . ACCU-CHEK SOFTCLIX LANCETS lancets Use to test blood sugar 4 times daily.  Dx: 250.92  100 each  5  . acetaminophen (TYLENOL) 325 MG tablet Take 650 mg by mouth every 8 (eight) hours as needed. For pain      . aspirin EC 81 MG tablet Take 162 mg by mouth daily.      . Blood Glucose Monitoring Suppl (ACCU-CHEK AVIVA PLUS) W/DEVICE KIT 1 each by Does not apply route 4 (four) times daily -  before meals and at bedtime.  1 kit  0  . Calcium Carbonate-Vitamin D (CALCIUM 600+D) 600-400 MG-UNIT per tablet Take 1 tablet by mouth daily.  30 tablet  11  . cetirizine (ZYRTEC) 10 MG tablet Take 10 mg by mouth daily.      . clozapine (CLOZARIL) 100 MG tablet Take 300 mg by mouth at bedtime.       . Cranberry 500 MG CAPS Take 500 mg by mouth daily.      Marland Kitchen diltiazem (CARDIZEM CD) 180 MG 24 hr capsule Take 1 capsule (180 mg total) by mouth daily.  30 capsule  0  . docusate sodium (COLACE) 100 MG capsule Take 1 capsule (100 mg total) by mouth 2 (two) times daily.  10 capsule  0  . ferrous sulfate 325 (65 FE) MG EC tablet Take 325 mg by mouth 2 (two) times daily.        . Fluticasone-Salmeterol (ADVAIR DISKUS) 250-50 MCG/DOSE AEPB Inhale 1 puff into the  lungs 2 (two) times daily.  60 each  4  . isosorbide mononitrate (IMDUR) 30 MG 24 hr tablet Take 1 tablet (30 mg total) by mouth daily.  30 tablet  5  . levothyroxine (SYNTHROID, LEVOTHROID) 50 MCG tablet Take 1 tablet (50 mcg total) by mouth daily.  30 tablet  11  . metoprolol succinate (TOPROL-XL) 100 MG 24 hr tablet Take 1 tablet (100 mg total) by mouth daily. Take with or immediately following a meal.  30 tablet  11  . Multiple Vitamin (MULTIVITAMIN WITH MINERALS) TABS Take 1 tablet by mouth daily. Oceanographer      . nitroGLYCERIN (NITROSTAT) 0.4 MG SL tablet Place 1 tablet (0.4 mg total) under the tongue every 5 (five) minutes as needed. For chest pain  10 tablet  0  . Omega-3 Fatty Acids (FISH OIL) 1000 MG CAPS Take 1 capsule (1,000 mg total) by mouth daily.      . pantoprazole (PROTONIX) 20 MG tablet Take 40 mg by mouth daily.      . polyethylene glycol powder (GLYCOLAX/MIRALAX) powder Take 17 g by mouth 2 (two) times daily as needed.  3350 g  11  . pravastatin (PRAVACHOL) 20 MG  tablet Take 1 tablet (20 mg total) by mouth at bedtime.  30 tablet  11  . predniSONE (DELTASONE) 10 MG tablet Take 1 tablet (10 mg total) by mouth daily. .  30 tablet  1  . PROAIR HFA 108 (90 BASE) MCG/ACT inhaler inhale 1 to 2 puffs by mouth every 4 hours if needed for shortness of breath  8.5 g  5  . traMADol (ULTRAM) 50 MG tablet Take 1 tablet (50 mg total) by mouth 2 (two) times daily as needed. For severe pain  60 tablet  11   No current facility-administered medications on file prior to visit.   Past Medical History  Diagnosis Date  . CHF (congestive heart failure)   . Diabetes mellitus   . S/P laparoscopic cholecystectomy   . Frozen shoulder     right  . Obesity   . Renal insufficiency   . COPD (chronic obstructive pulmonary disease)   . Shortness of breath   . Stroke      Review of Systems  Constitutional: Positive for fatigue. Negative for fever.  Respiratory: Negative.    Cardiovascular: Negative.   Gastrointestinal: Negative.   Musculoskeletal: Positive for back pain and arthralgias.  Psychiatric/Behavioral: Negative.   All other systems reviewed and are negative.       Objective:   Physical Exam  Nursing note and vitals reviewed. Constitutional: She is oriented to person, place, and time. She appears well-developed. No distress.  Cardiovascular: Normal rate, normal heart sounds and intact distal pulses.   No murmur heard. Pulmonary/Chest: Effort normal and breath sounds normal. No respiratory distress. She has no wheezes. She exhibits no tenderness.  Abdominal: Soft. Bowel sounds are normal. She exhibits no distension and no mass. There is no tenderness. There is no rebound.  Musculoskeletal: Normal range of motion. She exhibits no edema.  Neurological: She is alert and oriented to person, place, and time.       Assessment/Plan:     Polymyalgia rheumatica: DM: HTN: Estrogen deficiency:

## 2013-02-06 NOTE — Patient Instructions (Addendum)

## 2013-02-07 NOTE — Addendum Note (Signed)
Addended by: Janit Pagan T on: 02/07/2013 10:14 AM   Modules accepted: Orders

## 2013-02-08 ENCOUNTER — Telehealth: Payer: Self-pay | Admitting: *Deleted

## 2013-02-08 ENCOUNTER — Telehealth: Payer: Self-pay | Admitting: Cardiovascular Disease

## 2013-02-08 NOTE — Telephone Encounter (Signed)
Message forwarded to [facility]. Berlinda Last, LPN to discuss w/ Dr. Alanda Amass.  This note and paper chart# [id number] placed on Dr. Kandis Cocking cart.

## 2013-02-08 NOTE — Telephone Encounter (Signed)
[name] wants to get a Patent attorney for her mother, [name].

## 2013-02-08 NOTE — Telephone Encounter (Signed)
LMOVM for pt to return call.  Please inform of the following appt:   [facility] Dr. Ralene Cork [address] Phone: [phone] (to change or cancel appt)  Also mailed a reminder. [place], [name]

## 2013-02-09 DIAGNOSIS — Z23 Encounter for immunization: Secondary | ICD-10-CM | POA: Diagnosis not present

## 2013-02-13 ENCOUNTER — Telehealth: Payer: Self-pay | Admitting: *Deleted

## 2013-02-13 NOTE — Telephone Encounter (Signed)
JC please address. Patient's daughter is calling again.

## 2013-02-13 NOTE — Telephone Encounter (Signed)
Documentation of flu shot sent and entered. Wyatt Haste, RN-BSN

## 2013-02-13 NOTE — Telephone Encounter (Signed)
Taking her mother to [place] today,Her sister Cleatis Polka sent a fax or e-mail last week to Dr Alanda Amass so she could get a temporary handicap sticker.Her fax number is [phone]

## 2013-02-14 NOTE — Telephone Encounter (Signed)
Form has been signed and i will fax today

## 2013-02-19 ENCOUNTER — Other Ambulatory Visit: Payer: Self-pay

## 2013-03-02 DIAGNOSIS — Z79899 Other long term (current) drug therapy: Secondary | ICD-10-CM | POA: Diagnosis not present

## 2013-03-06 ENCOUNTER — Ambulatory Visit
Admission: RE | Admit: 2013-03-06 | Discharge: 2013-03-06 | Disposition: A | Discharge status: Home / Self Care | Payer: Medicare Other | Source: Ambulatory Visit | Attending: Family Medicine | Admitting: Family Medicine

## 2013-03-06 DIAGNOSIS — E2839 Other primary ovarian failure: Secondary | ICD-10-CM

## 2013-03-06 DIAGNOSIS — M899 Disorder of bone, unspecified: Secondary | ICD-10-CM | POA: Diagnosis not present

## 2013-03-09 ENCOUNTER — Encounter: Payer: Self-pay | Admitting: Cardiovascular Disease

## 2013-03-13 ENCOUNTER — Telehealth: Payer: Self-pay | Admitting: Cardiovascular Disease

## 2013-03-13 NOTE — Telephone Encounter (Signed)
Pt. Daughter told to use the ntg. As instructed and if pain doesn't improve go to the er. pts daughter stated understanding of instructions

## 2013-03-13 NOTE — Telephone Encounter (Signed)
Message forwarded to J.C. Wildman, LPN.  

## 2013-03-14 ENCOUNTER — Encounter: Payer: Self-pay | Admitting: Cardiology

## 2013-03-14 ENCOUNTER — Ambulatory Visit (INDEPENDENT_AMBULATORY_CARE_PROVIDER_SITE_OTHER): Payer: Medicare Other | Admitting: Cardiology

## 2013-03-14 VITALS — BP 126/72 | HR 72 | Ht 63.0 in | Wt 165.5 lb

## 2013-03-14 DIAGNOSIS — I10 Essential (primary) hypertension: Secondary | ICD-10-CM | POA: Diagnosis not present

## 2013-03-14 DIAGNOSIS — R079 Chest pain, unspecified: Secondary | ICD-10-CM | POA: Diagnosis not present

## 2013-03-14 DIAGNOSIS — E1165 Type 2 diabetes mellitus with hyperglycemia: Secondary | ICD-10-CM

## 2013-03-14 DIAGNOSIS — I2510 Atherosclerotic heart disease of native coronary artery without angina pectoris: Secondary | ICD-10-CM

## 2013-03-14 DIAGNOSIS — M353 Polymyalgia rheumatica: Secondary | ICD-10-CM

## 2013-03-14 MED ORDER — ISOSORBIDE MONONITRATE ER 60 MG PO TB24: 60.0000 mg | ORAL_TABLET | Freq: Every day | ORAL | Status: AC

## 2013-03-14 NOTE — Assessment & Plan Note (Signed)
stable °

## 2013-03-14 NOTE — Patient Instructions (Addendum)
Increase Imdur (isosorbide mononitrate) to 60 mg daily.  Your EKG looked good today, no changes.  Follow up with Dr. Rennis Golden in 2-3 weeks.  If increased pain in the mean time, please call our office.

## 2013-03-14 NOTE — Progress Notes (Signed)
03/14/2013   PCP: Janit Pagan, MD   Chief Complaint  Patient presents with  . Follow-up    episode of chest pain yesterday lasting 8-10 minutes started in the right breast and arm then moved to center chest then left chest under breast then between her shoulder blades.  No SOB or diaphoresis associated w/pain. Felt fatigued after episode.    Primary Cardiologist:Previously Dr. Alanda Amass now Dr. Rennis Golden  HPI:  55 YOWF presents today with her daughter after developing chest pain, Rt sided yesterday.  Pt had walked- her usual exercise in the morning, later while watching TV she developed Rt side chest pain, the pain then moved to mid sternal area then the rt arm.  Her throat felt strange also.  She had mild nausea.  Her daughter called our office and we instructed her to take NTG sl.  The patient was too afraid.  She took a lozenger that helped her throat.  Pain resolved within 15 min.    She has hx. Of CAD with cath in 2006 and rec'd Taxus DES of the mid RCA.  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  Last lexiscan myoview 09/2012 was low risk scan.  Echo 07/2012 EF 55-60%, mild hypokinesis of inf wall,  Grade 1 diastolic dysfunction.  Other hx as below.  With her negative nuc her Imdur had been d/c'd.  She developed chest pain and it was restarted at 30 mg.  Previously she had been on 60 mg.   No Known Allergies  Current Outpatient Prescriptions  Medication Sig Dispense Refill  . ACCU-CHEK AVIVA PLUS test strip TEST BLOOD GLUCOSE ONCE ADAY  50 each  4  . ACCU-CHEK SOFTCLIX LANCETS lancets Use to test blood sugar 4 times daily.  Dx: 250.92  100 each  5  . acetaminophen (TYLENOL) 325 MG tablet Take 650 mg by mouth every 8 (eight) hours as needed. For pain      . aspirin EC 81 MG tablet Take 162 mg by mouth daily.      . Blood Glucose Monitoring Suppl (ACCU-CHEK AVIVA PLUS) W/DEVICE KIT 1 each by Does not apply route 4 (four) times daily -  before meals and at bedtime.  1 kit  0  .  Calcium Carbonate-Vitamin D (CALCIUM 600+D) 600-400 MG-UNIT per tablet Take 1 tablet by mouth daily.  30 tablet  11  . cetirizine (ZYRTEC) 10 MG tablet Take 10 mg by mouth daily.      . clozapine (CLOZARIL) 100 MG tablet Take 300 mg by mouth at bedtime.       . Cranberry 500 MG CAPS Take 500 mg by mouth daily.      Marland Kitchen diltiazem (CARDIZEM CD) 180 MG 24 hr capsule Take 1 capsule (180 mg total) by mouth daily.  30 capsule  0  . docusate sodium (COLACE) 100 MG capsule Take 1 capsule (100 mg total) by mouth 2 (two) times daily.  10 capsule  0  . ferrous sulfate 325 (65 FE) MG EC tablet Take 325 mg by mouth 2 (two) times daily.        . Fluticasone-Salmeterol (ADVAIR DISKUS) 250-50 MCG/DOSE AEPB Inhale 1 puff into the lungs 2 (two) times daily.  60 each  4  . levothyroxine (SYNTHROID, LEVOTHROID) 50 MCG tablet Take 1 tablet (50 mcg total) by mouth daily.  30 tablet  11  . metoprolol succinate (TOPROL-XL) 100 MG 24 hr tablet Take 1 tablet (100 mg total) by mouth  daily. Take with or immediately following a meal.  30 tablet  11  . Multiple Vitamin (MULTIVITAMIN WITH MINERALS) TABS Take 1 tablet by mouth daily. Oceanographer      . nitroGLYCERIN (NITROSTAT) 0.4 MG SL tablet Place 1 tablet (0.4 mg total) under the tongue every 5 (five) minutes as needed. For chest pain  10 tablet  0  . Omega-3 Fatty Acids (FISH OIL) 1000 MG CAPS Take 1 capsule (1,000 mg total) by mouth daily.      . pantoprazole (PROTONIX) 20 MG tablet Take 40 mg by mouth daily.      . polyethylene glycol powder (GLYCOLAX/MIRALAX) powder Take 17 g by mouth 2 (two) times daily as needed.  3350 g  11  . pravastatin (PRAVACHOL) 20 MG tablet Take 1 tablet (20 mg total) by mouth at bedtime.  30 tablet  11  . predniSONE (DELTASONE) 10 MG tablet Take 1 tablet (10 mg total) by mouth daily. .  30 tablet  3  . PROAIR HFA 108 (90 BASE) MCG/ACT inhaler inhale 1 to 2 puffs by mouth every 4 hours if needed for shortness of breath  8.5 g  5  . traMADol  (ULTRAM) 50 MG tablet Take 1 tablet (50 mg total) by mouth 2 (two) times daily as needed. For severe pain  60 tablet  11  . isosorbide mononitrate (IMDUR) 60 MG 24 hr tablet Take 1 tablet (60 mg total) by mouth daily.  30 tablet  6   No current facility-administered medications for this visit.    Past Medical History  Diagnosis Date  . Diabetes mellitus   . Frozen shoulder     right  . Obesity   . Renal insufficiency   . COPD (chronic obstructive pulmonary disease)   . Shortness of breath   . Stroke   . CAD (coronary artery disease) 2006    Taxus stent of mid RCA. negative myoview 2014 and normal LV function  . Diastolic heart failure, NYHA class 1     Past Surgical History  Procedure Laterality Date  . Partial hysterectomy  1995  . Abdominal hysterectomy    . Cholecystectomy    . Coronary angioplasty with stent placement  2006    Taxus DES to RCA, 50 % residual    ZOX:[id number]:UJ colds or fevers, no weight changes Skin:no rashes or ulcers HEENT:no blurred vision, no congestion CV:see HPI PUL:see HPI GI:no diarrhea constipation or melena, no indigestion GU:no hematuria, no dysuria MS:+ joint pain, no claudication Neuro:no syncope, no lightheadedness Endo:+ diabetes, + thyroid disease  PHYSICAL EXAM BP 126/72  Pulse 72  Ht 5\' 3"  (1.6 m)  Wt 165 lb 8 oz (75.07 kg)  BMI 29.32 kg/m2 General:Pleasant but flat affect, NAD Skin:Warm and dry, brisk capillary refill HEENT:normocephalic, sclera clear, mucus membranes moist Neck:supple, no JVD, no bruits  Heart:S1S2 RRR with 1/6 systolic murmur, no gallup, rub or click Lungs:clear without rales, rhonchi, or wheezes WJX:BJYN, non tender, + BS, do not palpate liver spleen or masses Ext:no lower ext edema, 2+ pedal pulses, 2+ radial pulses Neuro:alert and oriented, MAE, follows commands, + facial symmetry  EKG:SR no changes, normal EKG  ASSESSMENT AND PLAN Chest pain at rest Pt developed rt sided chest pain at rest that  then moved to mid sternal and down her Rt arm and her throat felt strange.  A lozenger helped her throat.  Pain lasted 15 min. And resolved.  She did not take a NTG as the pain resolved  before she felt comfortable to take. Her nuc study in April of this year was normal.  With one somewhat atypical episode of pain and no EKG changes, I am increasing her Imdur back to the 60 mg.  She will follow up with Dr. Rennis Golden in 2-3 weeks.  Previously she had been a pt of Dr. Alanda Amass.   CAD s/p Taxus stent 01/2005.  Cardiologist: Dr. Rennis Golden  Nuclear Stress: 07/22/09 normal.  Nuclear stress test 4.2014 negative for ischemia. ECHO 2014  EF 55-60%, mild hypokinesis of inf myocardium, moderate concentric hypertrophy.  Mild MR.  Very mild pul. HTN.      Polymyalgia rheumatica On steroids, followed by Dr. Lum Babe.  HYPERTENSION, BENIGN SYSTEMIC stable  DIABETES MELLITUS, II, COMPLICATIONS stable

## 2013-03-14 NOTE — Assessment & Plan Note (Signed)
On steroids, followed by Dr. Lum Babe.

## 2013-03-14 NOTE — Assessment & Plan Note (Addendum)
Pt developed rt sided chest pain at rest that then moved to mid sternal and down her Rt arm and her throat felt strange.  A lozenger helped her throat.  Pain lasted 15 min. And resolved.  She did not take a NTG as the pain resolved before she felt comfortable to take. Her nuc study in April of this year was normal.  With one somewhat atypical episode of pain and no EKG changes, I am increasing her Imdur back to the 60 mg.  She will follow up with Dr. Rennis Golden in 2-3 weeks.  Previously she had been a pt of Dr. Alanda Amass.

## 2013-03-14 NOTE — Assessment & Plan Note (Signed)
s/p Taxus stent 01/2005.  Cardiologist: Dr. Rennis Golden  Nuclear Stress: 07/22/09 normal.  Nuclear stress test 4.2014 negative for ischemia. ECHO 2014  EF 55-60%, mild hypokinesis of inf myocardium, moderate concentric hypertrophy.  Mild MR.  Very mild pul. HTN.

## 2013-03-15 DIAGNOSIS — F200 Paranoid schizophrenia: Secondary | ICD-10-CM | POA: Diagnosis not present

## 2013-03-21 ENCOUNTER — Encounter: Payer: Self-pay | Admitting: Cardiology

## 2013-03-27 ENCOUNTER — Ambulatory Visit: Payer: Self-pay

## 2013-03-29 ENCOUNTER — Encounter: Payer: Self-pay | Admitting: Internal Medicine

## 2013-03-29 ENCOUNTER — Ambulatory Visit (INDEPENDENT_AMBULATORY_CARE_PROVIDER_SITE_OTHER): Payer: Medicare Other | Admitting: Internal Medicine

## 2013-03-29 VITALS — BP 142/60 | HR 80 | Ht 63.0 in | Wt 165.4 lb

## 2013-03-29 DIAGNOSIS — R079 Chest pain, unspecified: Secondary | ICD-10-CM | POA: Diagnosis not present

## 2013-03-29 DIAGNOSIS — I10 Essential (primary) hypertension: Secondary | ICD-10-CM | POA: Diagnosis not present

## 2013-03-29 DIAGNOSIS — I2510 Atherosclerotic heart disease of native coronary artery without angina pectoris: Secondary | ICD-10-CM

## 2013-03-29 DIAGNOSIS — E7800 Pure hypercholesterolemia, unspecified: Secondary | ICD-10-CM | POA: Diagnosis not present

## 2013-03-29 DIAGNOSIS — Z79899 Other long term (current) drug therapy: Secondary | ICD-10-CM | POA: Diagnosis not present

## 2013-03-29 DIAGNOSIS — L299 Pruritus, unspecified: Secondary | ICD-10-CM

## 2013-03-29 NOTE — Patient Instructions (Signed)
Your physician wants you to follow-up in:  6 months. You will receive a reminder letter in the mail two months in advance. If you don't receive a letter, please call our office to schedule the follow-up appointment.   

## 2013-03-29 NOTE — Progress Notes (Signed)
03/29/2013   PCP: Janit Pagan, MD   Chief Complaint  Patient presents with  . Follow-up    10/1 with Vernona Rieger; had been taken off Imdur and had started having palps, palps continued; now on 60mg  Imdur again; denies CP; DOE; fatigued; will have blood drawn today if needs labwork    Primary Cardiologist:Previously Dr. Alanda Amass now Dr. Rennis Golden  HPI:  53 YOWF presents today with her daughter after developing chest pain, Rt sided yesterday.  Pt had walked- her usual exercise in the morning, later while watching TV she developed Rt side chest pain, the pain then moved to mid sternal area then the rt arm.  Her throat felt strange also.  She had mild nausea.  Her daughter called our office and we instructed her to take NTG sl.  The patient was too afraid.  She took a lozenger that helped her throat.  Pain resolved within 15 min.    She has hx. Of CAD with cath in 2006 and rec'd Taxus DES of the mid RCA.  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  Last lexiscan myoview 09/2012 was low risk scan.  Echo 07/2012 EF 55-60%, mild hypokinesis of inf wall,  Grade 1 diastolic dysfunction.  Other hx as below.  With her negative nuc her Imdur had been d/c'd.  She developed chest pain and it was restarted at 30 mg.  Previously she had been on 60 mg.  At her last office visit, Nada Boozer started her back on Imdur 60 mg daily.  She now reports her chest pain has resolved.   No Known Allergies  Current Outpatient Prescriptions  Medication Sig Dispense Refill  . ACCU-CHEK AVIVA PLUS test strip TEST BLOOD GLUCOSE ONCE ADAY  50 each  4  . ACCU-CHEK SOFTCLIX LANCETS lancets Use to test blood sugar 4 times daily.  Dx: 250.92  100 each  5  . acetaminophen (TYLENOL) 500 MG tablet Take 500 mg by mouth every 6 (six) hours as needed for pain (2 tab in AM, as neded q6hours).      Marland Kitchen aspirin EC 81 MG tablet Take 162 mg by mouth daily.      . Blood Glucose Monitoring Suppl (ACCU-CHEK AVIVA PLUS) W/DEVICE KIT 1  each by Does not apply route 4 (four) times daily -  before meals and at bedtime.  1 kit  0  . Calcium Carbonate-Vitamin D 600-400 MG-UNIT per tablet Take 1 tablet by mouth 2 (two) times daily.      . cetirizine (ZYRTEC) 10 MG tablet Take 10 mg by mouth daily.      . clozapine (CLOZARIL) 100 MG tablet Take 300 mg by mouth at bedtime.       . Cranberry (SM CRANBERRY) 300 MG tablet Take 300 mg by mouth daily.      Marland Kitchen diltiazem (CARDIZEM CD) 180 MG 24 hr capsule Take 1 capsule (180 mg total) by mouth daily.  30 capsule  0  . docusate sodium (COLACE) 100 MG capsule Take 1 capsule (100 mg total) by mouth 2 (two) times daily.  10 capsule  0  . ferrous sulfate 325 (65 FE) MG EC tablet Take 325 mg by mouth 2 (two) times daily.        . Fluticasone-Salmeterol (ADVAIR DISKUS) 250-50 MCG/DOSE AEPB Inhale 1 puff into the lungs 2 (two) times daily.  60 each  4  . isosorbide mononitrate (IMDUR) 60 MG 24 hr tablet Take 1 tablet (60 mg  total) by mouth daily.  30 tablet  6  . levothyroxine (SYNTHROID, LEVOTHROID) 50 MCG tablet Take 1 tablet (50 mcg total) by mouth daily.  30 tablet  11  . metoprolol succinate (TOPROL-XL) 100 MG 24 hr tablet Take 1 tablet (100 mg total) by mouth daily. Take with or immediately following a meal.  30 tablet  11  . Multiple Vitamin (MULTIVITAMIN WITH MINERALS) TABS Take 1 tablet by mouth daily. Oceanographer      . nitroGLYCERIN (NITROSTAT) 0.4 MG SL tablet Place 1 tablet (0.4 mg total) under the tongue every 5 (five) minutes as needed. For chest pain  10 tablet  0  . Omega-3 Fatty Acids (FISH OIL) 1000 MG CAPS Take 2,000 mg by mouth daily.       . pantoprazole (PROTONIX) 20 MG tablet Take 40 mg by mouth daily.      . polyethylene glycol powder (GLYCOLAX/MIRALAX) powder Take 17 g by mouth 2 (two) times daily as needed.  3350 g  11  . pravastatin (PRAVACHOL) 20 MG tablet Take 1 tablet (20 mg total) by mouth at bedtime.  30 tablet  11  . predniSONE (DELTASONE) 10 MG tablet Take 1  tablet (10 mg total) by mouth daily. .  30 tablet  3  . PROAIR HFA 108 (90 BASE) MCG/ACT inhaler inhale 1 to 2 puffs by mouth every 4 hours if needed for shortness of breath  8.5 g  5  . traMADol (ULTRAM) 50 MG tablet Take 1 tablet (50 mg total) by mouth 2 (two) times daily as needed. For severe pain  60 tablet  11   No current facility-administered medications for this visit.    Past Medical History  Diagnosis Date  . Diabetes mellitus   . Frozen shoulder     right  . Obesity   . Renal insufficiency   . COPD (chronic obstructive pulmonary disease)   . Shortness of breath   . Stroke   . CAD (coronary artery disease) 2006    Taxus stent of mid RCA. negative myoview 2014 and normal LV function  . Diastolic heart failure, NYHA class 1     Past Surgical History  Procedure Laterality Date  . Partial hysterectomy  1995  . Abdominal hysterectomy    . Cholecystectomy    . Coronary angioplasty with stent placement  2006    Taxus DES to RCA, 50 % residual    WUJ:WJXBJYN:WG colds or fevers, no weight changes Skin:no rashes or ulcers HEENT:no blurred vision, no congestion CV:see HPI PUL:see HPI GI:no diarrhea constipation or melena, no indigestion GU:no hematuria, no dysuria MS:+ joint pain, no claudication Neuro:no syncope, no lightheadedness Endo:+ diabetes, + thyroid disease  PHYSICAL EXAM BP 142/60  Pulse 80  Ht 5\' 3"  (1.6 m)  Wt 165 lb 6.4 oz (75.025 kg)  BMI 29.31 kg/m2 deferred  EKG: deferred  ASSESSMENT AND PLAN Patient Active Problem List   Diagnosis Date Noted  . Chest pain at rest 03/14/2013  . Estrogen deficiency 02/06/2013  . Bruising 01/02/2013  . Osteoporosis screening 01/02/2013  . Breast cancer screening 01/02/2013  . Personal history of fall 10/19/2012  . Polymyalgia rheumatica 10/03/2012  . Weakness 09/27/2012  . UTI (urinary tract infection) 09/19/2012  . Trochanteric bursitis of right hip 04/11/2012  . At high risk for falls 02/28/2012  .  Lower urinary tract symptoms 01/27/2012  . Chronic constipation 01/26/2012  . Memory impairment 12/01/2010  . History of small bowel obstruction 11/04/2010  .  COPD, MILD 07/09/2010  . 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 CONSTIPATION, CHRONIC 12/12/2008  . PRURITUS 03/06/2008  . SCHIZOAFFECTIVE DISORDER 01/10/2008  . GOUT 09/11/2007  . LOW BACK PAIN, CHRONIC 07/05/2007  . HYPERCHOLESTEROLEMIA 01/25/2007  . HYPOTHYROIDISM, UNSPECIFIED 08/11/2006  . DIABETES MELLITUS, II, COMPLICATIONS 08/11/2006  . OBESITY, NOS 08/11/2006  . ANEMIA, NORMOCYTIC, CHRONIC 08/11/2006  . HYPERTENSION, BENIGN SYSTEMIC 08/11/2006  . CAD 08/11/2006  . HEMORRHOIDS, NOS 08/11/2006  . GASTROESOPHAGEAL REFLUX, NO ESOPHAGITIS 08/11/2006  . RENAL INSUFFICIENCY, CHRONIC 08/11/2006  . ARTHRITIS 08/11/2006   PLAN: 1.  Ms. Pomplun's chest pain is resolved with the addition of Imdur worked 60 mg daily. She continues to have some palpitations from time to time but is not particularly bothersome. Her blood pressure is controlled today. She continues on pravastatin for her dyslipidemia and needs refills for that. Otherwise we'll plan to see her back annually.  Chrystie Nose, MD, [facility] Attending Cardiologist [facility]

## 2013-04-03 ENCOUNTER — Other Ambulatory Visit: Payer: Self-pay | Admitting: *Deleted

## 2013-04-03 MED ORDER — PANTOPRAZOLE SODIUM 40 MG PO TBEC: 40.0000 mg | DELAYED_RELEASE_TABLET | Freq: Every day | ORAL | Status: AC

## 2013-04-03 NOTE — Telephone Encounter (Signed)
Rx was sent to pharmacy electronically. 

## 2013-04-04 ENCOUNTER — Telehealth: Payer: Self-pay | Admitting: Family Medicine

## 2013-04-04 NOTE — Telephone Encounter (Signed)
I called to inform patient about her CBC result from Dr Plovsky's office which she checks monthly since she is on Clozaril. H/H remains low,but this is chronic, record indicates she had colon pathology done which was benign, this was confirmed with daughter [name] who stated she had colonoscopy done in 2013 and is due to repeat test in about 5 yrs.

## 2013-04-23 DIAGNOSIS — Z79899 Other long term (current) drug therapy: Secondary | ICD-10-CM | POA: Diagnosis not present

## 2013-05-02 ENCOUNTER — Other Ambulatory Visit: Payer: Self-pay | Admitting: Family Medicine

## 2013-05-07 ENCOUNTER — Telehealth: Payer: Self-pay | Admitting: Family Medicine

## 2013-05-07 NOTE — Telephone Encounter (Signed)
Daughter called because her mother is on Prednisone. Now her daughter who is coming home from college has the flu. She is going to keep them separated but she wanted to know if she could give her mother  OTC theraflu to keep her from getting worse. jw

## 2013-05-07 NOTE — Telephone Encounter (Signed)
Pt mom informed. Malyssa Maris Dawn  

## 2013-05-07 NOTE — Telephone Encounter (Signed)
If she got the flu shot ,avoiding contact with her infected daughter should be okay.

## 2013-05-17 ENCOUNTER — Telehealth: Payer: Self-pay | Admitting: Family Medicine

## 2013-05-17 NOTE — Telephone Encounter (Signed)
Clinic portion completed and placed in providers box. Jazmin Hartsell,CMA  

## 2013-05-17 NOTE — Telephone Encounter (Signed)
Daughter brought in forms to be completed by Dr Lum Babe to attend [facility]

## 2013-05-18 NOTE — Telephone Encounter (Signed)
Spoke with [name] and she is aware that the papers will be faxed today.  Hazely Sealey,CMA

## 2013-05-18 NOTE — Telephone Encounter (Signed)
Form completed,placed in the front office for faxing.

## 2013-05-29 DIAGNOSIS — Z79899 Other long term (current) drug therapy: Secondary | ICD-10-CM | POA: Diagnosis not present

## 2013-05-30 ENCOUNTER — Ambulatory Visit: Payer: Self-pay | Admitting: Family Medicine

## 2013-06-28 ENCOUNTER — Other Ambulatory Visit: Payer: Self-pay | Admitting: Family Medicine

## 2013-06-28 ENCOUNTER — Ambulatory Visit (INDEPENDENT_AMBULATORY_CARE_PROVIDER_SITE_OTHER): Payer: Medicare Other | Admitting: Family Medicine

## 2013-06-28 ENCOUNTER — Ambulatory Visit (HOSPITAL_COMMUNITY)
Admission: RE | Admit: 2013-06-28 | Discharge: 2013-06-28 | Disposition: A | Discharge status: Home / Self Care | Payer: Medicare Other | Source: Ambulatory Visit | Attending: Family Medicine | Admitting: Family Medicine

## 2013-06-28 ENCOUNTER — Encounter: Payer: Self-pay | Admitting: Family Medicine

## 2013-06-28 VITALS — BP 113/67 | HR 79 | Temp 97.9°F | Wt 165.0 lb

## 2013-06-28 DIAGNOSIS — R0683 Snoring: Secondary | ICD-10-CM

## 2013-06-28 DIAGNOSIS — E118 Type 2 diabetes mellitus with unspecified complications: Principal | ICD-10-CM

## 2013-06-28 DIAGNOSIS — R0609 Other forms of dyspnea: Secondary | ICD-10-CM | POA: Diagnosis not present

## 2013-06-28 DIAGNOSIS — M810 Age-related osteoporosis without current pathological fracture: Secondary | ICD-10-CM | POA: Diagnosis not present

## 2013-06-28 DIAGNOSIS — R0989 Other specified symptoms and signs involving the circulatory and respiratory systems: Secondary | ICD-10-CM

## 2013-06-28 DIAGNOSIS — M545 Low back pain, unspecified: Secondary | ICD-10-CM | POA: Diagnosis not present

## 2013-06-28 DIAGNOSIS — E119 Type 2 diabetes mellitus without complications: Secondary | ICD-10-CM | POA: Diagnosis not present

## 2013-06-28 DIAGNOSIS — IMO0002 Reserved for concepts with insufficient information to code with codable children: Secondary | ICD-10-CM

## 2013-06-28 DIAGNOSIS — E1165 Type 2 diabetes mellitus with hyperglycemia: Secondary | ICD-10-CM

## 2013-06-28 DIAGNOSIS — K5909 Other constipation: Secondary | ICD-10-CM

## 2013-06-28 DIAGNOSIS — Z79899 Other long term (current) drug therapy: Secondary | ICD-10-CM | POA: Diagnosis not present

## 2013-06-28 DIAGNOSIS — T148XXA Other injury of unspecified body region, initial encounter: Secondary | ICD-10-CM

## 2013-06-28 DIAGNOSIS — M47817 Spondylosis without myelopathy or radiculopathy, lumbosacral region: Secondary | ICD-10-CM | POA: Diagnosis not present

## 2013-06-28 DIAGNOSIS — M5137 Other intervertebral disc degeneration, lumbosacral region: Secondary | ICD-10-CM | POA: Diagnosis not present

## 2013-06-28 LAB — POCT GLYCOSYLATED HEMOGLOBIN (HGB A1C): HEMOGLOBIN A1C: 5.8

## 2013-06-28 NOTE — Progress Notes (Signed)
Subjective:       Patient ID: Rebecca [name], female   DOB: 1940-08-05, 73 y.o.   MRN: [id number]  HPI Low back pain:C/O worsening back pain over the last 4 weeks,pain is mostly on her lower back,worse whenever she sits for long,she denies any recent fall, she uses tramadol prn pain with some improvement. Snoring:C/O snoring a lot at night with occasional choking, her daughter stated she noticed at times she stops breathing for a while and then would wake from her sleep. She has had neck fullness since she started steroid for her polymyalgia rheumatica. DM 2: Here for follow up, currently diet control. Thin skin: C/O thin skin which breaks and bruises easily in the last few weeks. Constipation: Still constipated, uses Colace BID, she drinks adequate water,denies stomach pain,no N/V,no blood in her stool.  Current Outpatient Prescriptions on File Prior to Visit  Medication Sig Dispense Refill  . ACCU-CHEK AVIVA PLUS test strip TEST BLOOD GLUCOSE ONCE ADAY  50 each  4  . ACCU-CHEK SOFTCLIX LANCETS lancets Use to test blood sugar 4 times daily.  Dx: 250.92  100 each  5  . acetaminophen (TYLENOL) 500 MG tablet Take 500 mg by mouth every 6 (six) hours as needed for pain (2 tab in AM, as neded q6hours).      Marland Kitchen aspirin EC 81 MG tablet Take 162 mg by mouth daily.      . Blood Glucose Monitoring Suppl (ACCU-CHEK AVIVA PLUS) W/DEVICE KIT 1 each by Does not apply route 4 (four) times daily -  before meals and at bedtime.  1 kit  0  . Calcium Carbonate-Vitamin D 600-400 MG-UNIT per tablet Take 1 tablet by mouth 2 (two) times daily.      . clozapine (CLOZARIL) 100 MG tablet Take 300 mg by mouth at bedtime.       . Cranberry (SM CRANBERRY) 300 MG tablet Take 300 mg by mouth daily.      Marland Kitchen diltiazem (CARDIZEM CD) 180 MG 24 hr capsule Take 1 capsule (180 mg total) by mouth daily.  30 capsule  0  . docusate sodium (COLACE) 100 MG capsule Take 1 capsule (100 mg total) by mouth 2 (two) times daily.  10 capsule  0    . ferrous sulfate 325 (65 FE) MG EC tablet Take 325 mg by mouth 2 (two) times daily.        . Fluticasone-Salmeterol (ADVAIR DISKUS) 250-50 MCG/DOSE AEPB Inhale 1 puff into the lungs 2 (two) times daily.  60 each  4  . isosorbide mononitrate (IMDUR) 60 MG 24 hr tablet Take 1 tablet (60 mg total) by mouth daily.  30 tablet  6  . levothyroxine (SYNTHROID, LEVOTHROID) 50 MCG tablet Take 1 tablet (50 mcg total) by mouth daily.  30 tablet  11  . metoprolol succinate (TOPROL-XL) 100 MG 24 hr tablet Take 1 tablet (100 mg total) by mouth daily. Take with or immediately following a meal.  30 tablet  11  . Multiple Vitamin (MULTIVITAMIN WITH MINERALS) TABS Take 1 tablet by mouth daily. Psychiatrist      . nitroGLYCERIN (NITROSTAT) 0.4 MG SL tablet Place 1 tablet (0.4 mg total) under the tongue every 5 (five) minutes as needed. For chest pain  10 tablet  0  . Omega-3 Fatty Acids (FISH OIL) 1000 MG CAPS Take 2,000 mg by mouth daily.       . pantoprazole (PROTONIX) 40 MG tablet Take 1 tablet (40 mg total) by mouth  daily.  30 tablet  11  . polyethylene glycol powder (GLYCOLAX/MIRALAX) powder Take 17 g by mouth 2 (two) times daily as needed.  3350 g  11  . pravastatin (PRAVACHOL) 20 MG tablet Take 1 tablet (20 mg total) by mouth at bedtime.  30 tablet  11  . predniSONE (DELTASONE) 10 MG tablet Take 1 tablet (10 mg total) by mouth daily. .  30 tablet  3  . PROAIR HFA 108 (90 BASE) MCG/ACT inhaler inhale 1 to 2 puffs by mouth every 4 hours if needed for shortness of breath  8.5 g  5  . traMADol (ULTRAM) 50 MG tablet take 1 tablet by mouth twice a day if needed SEVERE PAIN  60 tablet  5  . cetirizine (ZYRTEC) 10 MG tablet Take 10 mg by mouth daily.       No current facility-administered medications on file prior to visit.   Past Medical History  Diagnosis Date  . Diabetes mellitus   . Frozen shoulder     right  . Obesity   . Renal insufficiency   . COPD (chronic obstructive pulmonary disease)   .  Shortness of breath   . Stroke   . CAD (coronary artery disease) 2006    Taxus stent of mid RCA. negative myoview 2014 and normal LV function  . Diastolic heart failure, NYHA class 1       Review of Systems  Respiratory: Positive for apnea and choking. Negative for shortness of breath and wheezing.   Cardiovascular: Negative.   Gastrointestinal: Positive for constipation. Negative for nausea, vomiting, abdominal pain and diarrhea.  Musculoskeletal: Positive for back pain. Negative for gait problem.  Neurological: Negative.   All other systems reviewed and are negative.   Filed Vitals:   06/28/13 1420  BP: 113/67  Pulse: 79  Temp: 97.9 F (36.6 C)  TempSrc: Oral  Weight: 165 lb (74.844 kg)       Objective:   Physical Exam  Nursing note and vitals reviewed. Constitutional: She is oriented to person, place, and time. She appears well-developed. No distress.  Cardiovascular: Normal rate, regular rhythm, normal heart sounds and intact distal pulses.   No murmur heard. Pulmonary/Chest: Effort normal and breath sounds normal. No respiratory distress. She has no wheezes.  Abdominal: Soft. Bowel sounds are normal. She exhibits no mass. There is no tenderness.  Musculoskeletal: Normal range of motion. She exhibits no edema.       Lumbar back: She exhibits bony tenderness. She exhibits normal range of motion, no deformity and no spasm.  Neurological: She is alert and oriented to person, place, and time.  Skin: Skin is intact.  Thin skin of her hands and arm, few clearing bruises on the dorsum of her hands.       Assessment:     Back pain DM2 Snoring: Sleep apnea Thin skin/Brusing. Constipation     Plan:     Check problem list

## 2013-06-28 NOTE — Patient Instructions (Signed)
Back Pain, Adult  Back pain is very common. The pain often gets better over time. The cause of back pain is usually not dangerous. Most people can learn to manage their back pain on their own.   HOME CARE   · Stay active. Start with short walks on flat ground if you can. Try to walk farther each day.  · Do not sit, drive, or stand in one place for more than 30 minutes. Do not stay in bed.  · Do not avoid exercise or work. Activity can help your back heal faster.  · Be careful when you bend or lift an object. Bend at your knees, keep the object close to you, and do not twist.  · Sleep on a firm mattress. Lie on your side, and bend your knees. If you lie on your back, put a pillow under your knees.  · Only take medicines as told by your doctor.  · Put ice on the injured area.  · Put ice in a plastic bag.  · Place a towel between your skin and the bag.  · Leave the ice on for 15-20 minutes, 03-04 times a day for the first 2 to 3 days. After that, you can switch between ice and heat packs.  · Ask your doctor about back exercises or massage.  · Avoid feeling anxious or stressed. Find good ways to deal with stress, such as exercise.  GET HELP RIGHT AWAY IF:   · Your pain does not go away with rest or medicine.  · Your pain does not go away in 1 week.  · You have new problems.  · You do not feel well.  · The pain spreads into your legs.  · You cannot control when you poop (bowel movement) or pee (urinate).  · Your arms or legs feel weak or lose feeling (numbness).  · You feel sick to your stomach (nauseous) or throw up (vomit).  · You have belly (abdominal) pain.  · You feel like you may pass out (faint).  MAKE SURE YOU:   · Understand these instructions.  · Will watch your condition.  · Will get help right away if you are not doing well or get worse.  Document Released: 11/17/2007 Document Revised: 08/23/2011 Document Reviewed: 10/19/2010  [employer]® Patient Information ©2014 [employer].

## 2013-06-29 ENCOUNTER — Telehealth: Payer: Self-pay | Admitting: Family Medicine

## 2013-06-29 ENCOUNTER — Other Ambulatory Visit: Payer: Self-pay | Admitting: Family Medicine

## 2013-06-29 DIAGNOSIS — N184 Chronic kidney disease, stage 4 (severe): Secondary | ICD-10-CM

## 2013-06-29 DIAGNOSIS — N189 Chronic kidney disease, unspecified: Secondary | ICD-10-CM

## 2013-06-29 DIAGNOSIS — M549 Dorsalgia, unspecified: Secondary | ICD-10-CM

## 2013-06-29 LAB — VITAMIN B12: VITAMIN B 12: 815 pg/mL (ref 211–911)

## 2013-06-29 MED ORDER — PREDNISONE 1 MG PO TABS: ORAL_TABLET | ORAL | Status: DC

## 2013-06-29 MED ORDER — ALENDRONATE SODIUM 70 MG PO TABS: 70.0000 mg | ORAL_TABLET | ORAL | Status: DC

## 2013-06-29 NOTE — Telephone Encounter (Signed)
I called to discuss xray report with her daughter: With shortening of her spinal height at T2 suggestive of compression fracture.She does have osteoporosis with use of steroid as well. Plan to taper her steroid with 10 mg alternating with 7 mg initially and then switch to 7 mg till she is completely off. Back brace recommended,continue Oscal D. Vit D level checked,result pending. I will also start her on Fosamax. MRI of the spine ordered to assess for severity.

## 2013-06-29 NOTE — Assessment & Plan Note (Signed)
Pain on going for months but worsened in the last 4 wks. Xray in April 2014 showed DJD. Xray repeated for this visit showed: Age-indeterminate mild anterior vertebral body height loss at T12 Likely compression fracture, no osteolytic or osteoblastic lesion suggestive of cancer. Plan to obtain MRI for further assessment. Will consider sport medicine referral for intraarticular steroid injection.

## 2013-06-29 NOTE — Assessment & Plan Note (Signed)
R/O sleep apnea. Sleep study ordered. Might need CPAP.

## 2013-06-29 NOTE — Assessment & Plan Note (Signed)
Stable off medication. A1C checked during this visit is 5.8 despite being on steroid. She does not meet diagnostic criteria for diabetes any longer. Will consider taking this off her list if recheck A1C remains normal.

## 2013-06-29 NOTE — Assessment & Plan Note (Signed)
Continue Colace. Add Miralax. Fiber diet encouraged. Up to date with colonoscopy. Will reassess at next visit.

## 2013-06-29 NOTE — Assessment & Plan Note (Signed)
Here skin is still thin but bruises improved compared to last visit. Again I discussed tapering off her oral steroid which she is on for her PMR. At this time she and her daughter does not feel comfortable with that. Advised bumping hand against object which can trigger bruising. Will reassess at next visit.

## 2013-07-04 ENCOUNTER — Telehealth: Payer: Self-pay | Admitting: Family Medicine

## 2013-07-04 LAB — VITAMIN D 1,25 DIHYDROXY
Vitamin D2 1, 25 (OH)2: <8 pg/mL
Vitamin D3 1, 25 (OH)2: <8 pg/mL

## 2013-07-04 MED ORDER — VITAMIN D (ERGOCALCIFEROL) 1.25 MG (50000 UNIT) PO CAPS: 50000.0000 [IU] | ORAL_CAPSULE | ORAL | Status: DC

## 2013-07-04 NOTE — Telephone Encounter (Signed)
Vitamin D < 8, I discussed result with her daughter,plan to start on Vit D 50000 unit qweek for 8 wks, then switch to vit D 2000 unit daily.

## 2013-07-06 ENCOUNTER — Telehealth: Payer: Self-pay | Admitting: Family Medicine

## 2013-07-06 ENCOUNTER — Other Ambulatory Visit (INDEPENDENT_AMBULATORY_CARE_PROVIDER_SITE_OTHER): Payer: Medicare Other

## 2013-07-06 DIAGNOSIS — N189 Chronic kidney disease, unspecified: Secondary | ICD-10-CM | POA: Diagnosis not present

## 2013-07-06 LAB — POCT URINALYSIS DIPSTICK
Bilirubin, UA: NEGATIVE
Blood, UA: NEGATIVE
GLUCOSE UA: NEGATIVE
Ketones, UA: NEGATIVE
NITRITE UA: NEGATIVE
PROTEIN UA: 30
Spec Grav, UA: 1.01
UROBILINOGEN UA: 0.2
pH, UA: 6.5

## 2013-07-06 LAB — POCT UA - MICROSCOPIC ONLY

## 2013-07-06 MED ORDER — LISINOPRIL 5 MG PO TABS: 5.0000 mg | ORAL_TABLET | Freq: Every day | ORAL | Status: DC

## 2013-07-06 MED ORDER — METOPROLOL SUCCINATE ER 50 MG PO TB24: 50.0000 mg | ORAL_TABLET | Freq: Every day | ORAL | Status: DC

## 2013-07-06 NOTE — Telephone Encounter (Signed)
I spoke with patient's daughter about her proteinuria with urine protein of 30 which is an improvement from 7 months ago with protein of 100. Patient as per her daughter had not been on any nephro-protective medication,denies any allergy to ACEi that she is aware of. I suggested she started Lisinopril 5 mg, while we cut down her metoprolol to 50 mg daily since her BP is already in the 120/60 range. I refilled her medication. Her daughter verbalized understanding of my instructions. She has Nephrology f/u Feb 5th and I suggested she follow up with me in 2 wks for BP assessment. I also recommended home BP monitoring to assess for hypotension.

## 2013-07-06 NOTE — Progress Notes (Signed)
BMP AND UA DONE TODAY Rebecca [name] 

## 2013-07-07 LAB — BASIC METABOLIC PANEL
BUN: 36 mg/dL — AB (ref 6–23)
CO2: 27 meq/L (ref 19–32)
CREATININE: 1.44 mg/dL — AB (ref 0.50–1.10)
Calcium: 9 mg/dL (ref 8.4–10.5)
Chloride: 95 mEq/L — ABNORMAL LOW (ref 96–112)
Glucose, Bld: 133 mg/dL — ABNORMAL HIGH (ref 70–99)
Potassium: 5.3 mEq/L (ref 3.5–5.3)
Sodium: 129 mEq/L — ABNORMAL LOW (ref 135–145)

## 2013-07-09 DIAGNOSIS — F200 Paranoid schizophrenia: Secondary | ICD-10-CM | POA: Diagnosis not present

## 2013-07-10 ENCOUNTER — Telehealth: Payer: Self-pay | Admitting: Family Medicine

## 2013-07-10 NOTE — Telephone Encounter (Signed)
I called to discuss lab result with patient's daughter about her low sodium of 127, daughter denies any new symptoms that she is aware of. I reviewed her medication as discussed with her daughter,non seem to post the risk of hyponatremia. I recommended adding a little more salt to diet as her daughter stated she cut back on her salt intake. Also advise to ensure she is not drinking excessive water. Plan to recheck Bmet in 2wks, patient's daughter agreed with plan and verbalized understanding.

## 2013-07-12 ENCOUNTER — Ambulatory Visit (HOSPITAL_COMMUNITY): Admission: RE | Admit: 2013-07-12 | Payer: Medicare Other | Source: Ambulatory Visit

## 2013-07-12 ENCOUNTER — Encounter: Payer: Self-pay | Admitting: Family Medicine

## 2013-07-16 ENCOUNTER — Encounter: Payer: Self-pay | Admitting: Family Medicine

## 2013-07-16 ENCOUNTER — Ambulatory Visit (INDEPENDENT_AMBULATORY_CARE_PROVIDER_SITE_OTHER): Payer: Medicare Other | Admitting: Family Medicine

## 2013-07-16 ENCOUNTER — Ambulatory Visit
Admission: RE | Admit: 2013-07-16 | Discharge: 2013-07-16 | Disposition: A | Discharge status: Home / Self Care | Payer: Medicare Other | Source: Ambulatory Visit | Attending: Family Medicine | Admitting: Family Medicine

## 2013-07-16 VITALS — BP 113/68 | HR 102 | Temp 98.0°F | Ht 63.0 in | Wt 165.0 lb

## 2013-07-16 DIAGNOSIS — J449 Chronic obstructive pulmonary disease, unspecified: Secondary | ICD-10-CM

## 2013-07-16 DIAGNOSIS — J441 Chronic obstructive pulmonary disease with (acute) exacerbation: Secondary | ICD-10-CM | POA: Diagnosis not present

## 2013-07-16 DIAGNOSIS — R05 Cough: Secondary | ICD-10-CM | POA: Diagnosis not present

## 2013-07-16 DIAGNOSIS — R5381 Other malaise: Secondary | ICD-10-CM | POA: Diagnosis not present

## 2013-07-16 DIAGNOSIS — R059 Cough, unspecified: Secondary | ICD-10-CM | POA: Diagnosis not present

## 2013-07-16 DIAGNOSIS — R0989 Other specified symptoms and signs involving the circulatory and respiratory systems: Secondary | ICD-10-CM | POA: Diagnosis not present

## 2013-07-16 DIAGNOSIS — R531 Weakness: Secondary | ICD-10-CM

## 2013-07-16 DIAGNOSIS — D649 Anemia, unspecified: Secondary | ICD-10-CM | POA: Diagnosis not present

## 2013-07-16 DIAGNOSIS — R5383 Other fatigue: Secondary | ICD-10-CM | POA: Diagnosis not present

## 2013-07-16 LAB — BASIC METABOLIC PANEL
BUN: 39 mg/dL — ABNORMAL HIGH (ref 6–23)
CO2: 27 mEq/L (ref 19–32)
Calcium: 9.1 mg/dL (ref 8.4–10.5)
Chloride: 98 mEq/L (ref 96–112)
Creat: 1.85 mg/dL — ABNORMAL HIGH (ref 0.50–1.10)
GLUCOSE: 170 mg/dL — AB (ref 70–99)
Potassium: 5 mEq/L (ref 3.5–5.3)
SODIUM: 133 meq/L — AB (ref 135–145)

## 2013-07-16 LAB — POCT HEMOGLOBIN: Hemoglobin: 10 g/dL — AB (ref 12.2–16.2)

## 2013-07-16 MED ORDER — PREDNISONE 1 MG PO TABS: 1.0000 mg | ORAL_TABLET | Freq: Every day | ORAL | Status: DC

## 2013-07-16 MED ORDER — LEVOFLOXACIN 750 MG PO TABS: 750.0000 mg | ORAL_TABLET | Freq: Every day | ORAL | Status: DC

## 2013-07-16 MED ORDER — IPRATROPIUM BROMIDE 0.02 % IN SOLN
0.5000 mg | Freq: Once | RESPIRATORY_TRACT | Status: AC
Start: 2013-07-16 — End: 2013-07-16
  Administered 2013-07-16: 0.5 mg via RESPIRATORY_TRACT

## 2013-07-16 MED ORDER — ALBUTEROL SULFATE (2.5 MG/3ML) 0.083% IN NEBU
2.5000 mg | INHALATION_SOLUTION | Freq: Once | RESPIRATORY_TRACT | Status: AC
  Administered 2013-07-16: 2.5 mg via RESPIRATORY_TRACT

## 2013-07-16 MED ORDER — LEVOFLOXACIN 750 MG PO TABS: 750.0000 mg | ORAL_TABLET | ORAL | Status: DC

## 2013-07-16 MED ORDER — PREDNISONE 10 MG PO TABS: 10.0000 mg | ORAL_TABLET | Freq: Every day | ORAL | Status: DC

## 2013-07-16 NOTE — Progress Notes (Signed)
   Subjective:    Patient ID: Rebecca [name], female    DOB: [birth year], 73 y.o.   MRN: [id number]  HPI 73 yo F with COPD on chronic prednisone (8-9 mg per day) presents with daughter for SD visit:  1. Productive cough: x one day. Associated with chest pain with cough and fatigue. No CP with exertion, SOB or fever. No known sick contacts. Patient taking advair. No albuterol today. No recent antibiotics.   Soc Hx: lives with her daughter. Non smoker.  Review of Systems As per HPI     Objective:   Physical Exam BP 113/68  Pulse 102  Temp(Src) 98 F (36.7 C) (Oral)  Ht 5\' 3"  (1.6 m)  Wt 165 lb (74.844 kg)  BMI 29.24 kg/m2  SpO2 97% General appearance: alert, cooperative and no distress, elderly appearing adult female.  Eyes: conjunctivae/corneas clear. PERRL, EOM's intact.  Ears: normal TM and external ear canal left ear and abnormal external canal right ear - cerumen removed with manual debridement normal TM visualized  Throat: lips, mucosa, and tongue normal; teeth and gums normal Neck: no adenopathy, no carotid bruit, no JVD, supple, symmetrical, trachea midline and thyroid not enlarged, symmetric, no tenderness/mass/nodules Lungs: normal WOB. Coarse BS LUL. no wheezing.  Heart: regular rate and rhythm, S1, S2 normal, no murmur, click, rub or gallop  Duoneb x one given. LUL crackles cleared post duoneb.  Reviewed CXR: negative for focal infiltrate     Assessment & Plan:

## 2013-07-16 NOTE — Assessment & Plan Note (Signed)
A: 73 yo F with moderate COPD having a mild to moderate COPD exacerbation. Reviewed CXR no focal infiltrate. No hypoxia. Improvement in lung exam s/p duoneb. P: Levaquin one tab ever 48 hrs (for calculated CrCl of 40.5).  Increase prednisone to 50 mg daily for the next 7 days.  Use albuterol ever 6 hrs for the next 3 days, then back to as needed.  If Mrs. Carr has difficulty bringing up phlegm start mucinex.  F/u with PCP in 7 days for re-evaluation and prednisone taper

## 2013-07-16 NOTE — Patient Instructions (Addendum)
Thank you for coming in today.  I am concerned that Rebecca [name] is having a COPD exacerbation. For this we will do the following:  Levaquin one tab ever 48 hrs  Increase prednisone to 50 mg daily for the next 7 days.  Use albuterol ever 6 hrs for the next 3 days, then back to as needed.  If Rebecca [name] has difficulty bringing up phlegm start mucinex.   CXR and blood work.   I will be in touch with results.  Plan to f/u with Dr. Gwendlyn Deutscher next week (7 days) to start prednisone taper.  Call if she develops fever, worsening fatigue.   Dr. Adrian Blackwater

## 2013-07-17 ENCOUNTER — Other Ambulatory Visit: Payer: Self-pay | Admitting: Family Medicine

## 2013-07-17 LAB — CBC WITH DIFFERENTIAL/PLATELET
BASOS ABS: 0 10*3/uL (ref 0.0–0.1)
Basophils Relative: 0 % (ref 0–1)
Eosinophils Absolute: 0.1 10*3/uL (ref 0.0–0.7)
Eosinophils Relative: 0 % (ref 0–5)
HEMATOCRIT: 32.9 % — AB (ref 36.0–46.0)
Hemoglobin: 11.4 g/dL — ABNORMAL LOW (ref 12.0–15.0)
Lymphocytes Relative: 8 % — ABNORMAL LOW (ref 12–46)
Lymphs Abs: 1.5 10*3/uL (ref 0.7–4.0)
MCH: 31.4 pg (ref 26.0–34.0)
MCHC: 34.7 g/dL (ref 30.0–36.0)
MCV: 90.6 fL (ref 78.0–100.0)
MONO ABS: 1.1 10*3/uL — AB (ref 0.1–1.0)
Monocytes Relative: 7 % (ref 3–12)
NEUTROS ABS: 14.8 10*3/uL — AB (ref 1.7–7.7)
Neutrophils Relative %: 85 % — ABNORMAL HIGH (ref 43–77)
PLATELETS: 198 10*3/uL (ref 150–400)
RBC: 3.63 MIL/uL — ABNORMAL LOW (ref 3.87–5.11)
RDW: 13.2 % (ref 11.5–15.5)
WBC: 17.4 10*3/uL — ABNORMAL HIGH (ref 4.0–10.5)

## 2013-07-17 NOTE — Telephone Encounter (Signed)
Called patient. Daughter states patient is doing much better started feeling better yesterday evening.  Eating well today, w/o resp distress, still coughing.  Daughter gave an extra 10 mg daily prednisone last night. Gave 20 mg of prednisone today.  She is very reluctant to give prednisone 50 mg due to concern for elevated CBGs, and her pharmacy will not refill prednisone 10 mg until 2/7 and prednisone 1 mg until 2/9.  Patient is taking levaquin every other day. She is taking mucinex.   Assessment: improving.  Plan for 20 mg today and tomorrow. ]back to prednisone 10 mg on 07/19/13. Finish levaquin.  F/u next week with Dr. Gwendlyn Deutscher.   We reviewed labs and studies:  Elevated Cr (renally dose levaquin) Improved hyponatremia, Elevated WBC with left shift. Patient on levaquin and increased steroids for COPD exacerbation.  CXR w/o discrete infiltrate.

## 2013-07-19 DIAGNOSIS — N183 Chronic kidney disease, stage 3 unspecified: Secondary | ICD-10-CM | POA: Diagnosis not present

## 2013-07-19 DIAGNOSIS — I129 Hypertensive chronic kidney disease with stage 1 through stage 4 chronic kidney disease, or unspecified chronic kidney disease: Secondary | ICD-10-CM | POA: Diagnosis not present

## 2013-07-19 DIAGNOSIS — N390 Urinary tract infection, site not specified: Secondary | ICD-10-CM | POA: Diagnosis not present

## 2013-07-19 DIAGNOSIS — M810 Age-related osteoporosis without current pathological fracture: Secondary | ICD-10-CM | POA: Diagnosis not present

## 2013-07-23 ENCOUNTER — Other Ambulatory Visit: Payer: Self-pay

## 2013-07-24 ENCOUNTER — Ambulatory Visit: Payer: Self-pay | Admitting: Family Medicine

## 2013-07-27 ENCOUNTER — Ambulatory Visit (HOSPITAL_COMMUNITY): Admission: RE | Admit: 2013-07-27 | Payer: Medicare Other | Source: Ambulatory Visit

## 2013-07-27 ENCOUNTER — Ambulatory Visit (HOSPITAL_COMMUNITY)
Admission: RE | Admit: 2013-07-27 | Discharge: 2013-07-27 | Disposition: A | Discharge status: Home / Self Care | Payer: Medicare Other | Source: Ambulatory Visit | Attending: Family Medicine | Admitting: Family Medicine

## 2013-07-27 DIAGNOSIS — M5137 Other intervertebral disc degeneration, lumbosacral region: Secondary | ICD-10-CM | POA: Diagnosis not present

## 2013-07-27 DIAGNOSIS — M51379 Other intervertebral disc degeneration, lumbosacral region without mention of lumbar back pain or lower extremity pain: Secondary | ICD-10-CM | POA: Insufficient documentation

## 2013-07-27 DIAGNOSIS — M4804 Spinal stenosis, thoracic region: Secondary | ICD-10-CM | POA: Diagnosis not present

## 2013-07-27 DIAGNOSIS — M5126 Other intervertebral disc displacement, lumbar region: Secondary | ICD-10-CM | POA: Insufficient documentation

## 2013-07-27 DIAGNOSIS — IMO0002 Reserved for concepts with insufficient information to code with codable children: Secondary | ICD-10-CM | POA: Diagnosis not present

## 2013-07-27 DIAGNOSIS — M5124 Other intervertebral disc displacement, thoracic region: Secondary | ICD-10-CM | POA: Diagnosis not present

## 2013-07-27 DIAGNOSIS — M549 Dorsalgia, unspecified: Secondary | ICD-10-CM

## 2013-07-27 DIAGNOSIS — M47814 Spondylosis without myelopathy or radiculopathy, thoracic region: Secondary | ICD-10-CM | POA: Diagnosis not present

## 2013-07-30 ENCOUNTER — Telehealth: Payer: Self-pay | Admitting: Family Medicine

## 2013-07-30 ENCOUNTER — Telehealth: Payer: Self-pay | Admitting: *Deleted

## 2013-07-30 NOTE — Telephone Encounter (Signed)
Prior Authorization received from [employer] for Advair 250-50 Diskus. Formulary and PA form placed in provider box for completion. Derl Barrow, RN

## 2013-07-30 NOTE — Telephone Encounter (Signed)
Daughter called and would like Dr. Gwendlyn Deutscher to call with her mother's MRI results. jw

## 2013-07-31 ENCOUNTER — Ambulatory Visit (HOSPITAL_BASED_OUTPATIENT_CLINIC_OR_DEPARTMENT_OTHER): Payer: Medicare Other

## 2013-07-31 ENCOUNTER — Ambulatory Visit: Payer: Self-pay | Admitting: Family Medicine

## 2013-08-01 NOTE — Telephone Encounter (Signed)
Form completed and placed in front office for faxing. 

## 2013-08-02 ENCOUNTER — Telehealth: Payer: Self-pay | Admitting: Family Medicine

## 2013-08-02 NOTE — Telephone Encounter (Signed)
MRI report discussed with her daughter, all question answered. Tramadol prn pain and if no improvement will refer for PT.

## 2013-08-08 ENCOUNTER — Telehealth: Payer: Self-pay | Admitting: *Deleted

## 2013-08-08 DIAGNOSIS — Z79899 Other long term (current) drug therapy: Secondary | ICD-10-CM | POA: Diagnosis not present

## 2013-08-08 DIAGNOSIS — H5210 Myopia, unspecified eye: Secondary | ICD-10-CM | POA: Diagnosis not present

## 2013-08-08 DIAGNOSIS — E119 Type 2 diabetes mellitus without complications: Secondary | ICD-10-CM | POA: Diagnosis not present

## 2013-08-08 NOTE — Telephone Encounter (Signed)
completed

## 2013-08-08 NOTE — Telephone Encounter (Signed)
PA for Advair Diskus faxed to [facility] for review.  Derl Barrow, RN

## 2013-08-08 NOTE — Telephone Encounter (Signed)
Prior Authorization received from [employer] for Advair 250-50 Diskus. PA form placed in provider box for completion. Derl Barrow, RN

## 2013-08-10 ENCOUNTER — Other Ambulatory Visit: Payer: Self-pay | Admitting: Family Medicine

## 2013-08-10 MED ORDER — BUDESONIDE-FORMOTEROL FUMARATE 160-4.5 MCG/ACT IN AERO: 2.0000 | INHALATION_SPRAY | Freq: Two times a day (BID) | RESPIRATORY_TRACT | Status: DC

## 2013-08-10 NOTE — Telephone Encounter (Signed)
Advair not approved by her insurance hence switched to Symbicort. Her daughter informed.

## 2013-08-14 ENCOUNTER — Ambulatory Visit: Payer: Self-pay | Admitting: Family Medicine

## 2013-08-14 NOTE — Telephone Encounter (Signed)
Call pt insurance to check status of PA.  PA was denied, pt needs to try Symbicort for at least 30 days.  Denial is being faxed to provider office.  Derl Barrow, RN

## 2013-08-14 NOTE — Telephone Encounter (Signed)
I spoke with patient's daughter yesterday about denies Advair and  Symbicort was prescribed yesterday as well, she is aware to pick up her prescription from the Pharmacy.

## 2013-08-15 ENCOUNTER — Encounter: Payer: Self-pay | Admitting: Family Medicine

## 2013-08-16 ENCOUNTER — Other Ambulatory Visit: Payer: Self-pay | Admitting: Family Medicine

## 2013-08-27 ENCOUNTER — Other Ambulatory Visit: Payer: Self-pay | Admitting: *Deleted

## 2013-08-27 MED ORDER — VITAMIN D (ERGOCALCIFEROL) 1.25 MG (50000 UNIT) PO CAPS: 50000.0000 [IU] | ORAL_CAPSULE | ORAL | Status: AC

## 2013-08-29 DIAGNOSIS — I10 Essential (primary) hypertension: Secondary | ICD-10-CM | POA: Diagnosis not present

## 2013-08-29 DIAGNOSIS — E039 Hypothyroidism, unspecified: Secondary | ICD-10-CM | POA: Diagnosis not present

## 2013-08-29 DIAGNOSIS — J449 Chronic obstructive pulmonary disease, unspecified: Secondary | ICD-10-CM | POA: Diagnosis not present

## 2013-08-29 DIAGNOSIS — M533 Sacrococcygeal disorders, not elsewhere classified: Secondary | ICD-10-CM | POA: Diagnosis not present

## 2013-08-29 DIAGNOSIS — M353 Polymyalgia rheumatica: Secondary | ICD-10-CM | POA: Diagnosis not present

## 2013-08-29 DIAGNOSIS — N189 Chronic kidney disease, unspecified: Secondary | ICD-10-CM | POA: Diagnosis not present

## 2013-08-29 DIAGNOSIS — E785 Hyperlipidemia, unspecified: Secondary | ICD-10-CM | POA: Diagnosis not present

## 2013-08-29 DIAGNOSIS — D509 Iron deficiency anemia, unspecified: Secondary | ICD-10-CM | POA: Diagnosis not present

## 2013-08-29 DIAGNOSIS — K219 Gastro-esophageal reflux disease without esophagitis: Secondary | ICD-10-CM | POA: Diagnosis not present

## 2013-08-29 DIAGNOSIS — Z1331 Encounter for screening for depression: Secondary | ICD-10-CM | POA: Diagnosis not present

## 2013-08-30 ENCOUNTER — Ambulatory Visit (HOSPITAL_BASED_OUTPATIENT_CLINIC_OR_DEPARTMENT_OTHER): Payer: Medicare Other | Attending: Family Medicine | Admitting: Radiology

## 2013-08-30 VITALS — Ht 64.0 in | Wt 165.0 lb

## 2013-08-30 DIAGNOSIS — G4730 Sleep apnea, unspecified: Secondary | ICD-10-CM | POA: Diagnosis not present

## 2013-08-30 DIAGNOSIS — G4710 Hypersomnia, unspecified: Secondary | ICD-10-CM | POA: Insufficient documentation

## 2013-08-30 DIAGNOSIS — R0683 Snoring: Secondary | ICD-10-CM

## 2013-09-04 ENCOUNTER — Ambulatory Visit: Payer: Self-pay | Admitting: Family Medicine

## 2013-09-04 DIAGNOSIS — Z79899 Other long term (current) drug therapy: Secondary | ICD-10-CM | POA: Diagnosis not present

## 2013-09-05 ENCOUNTER — Other Ambulatory Visit: Payer: Self-pay | Admitting: Family Medicine

## 2013-09-08 DIAGNOSIS — R0609 Other forms of dyspnea: Secondary | ICD-10-CM | POA: Diagnosis not present

## 2013-09-08 DIAGNOSIS — R0989 Other specified symptoms and signs involving the circulatory and respiratory systems: Secondary | ICD-10-CM

## 2013-09-08 NOTE — Sleep Study (Signed)
   NAME: Rebecca [name] DATE OF BIRTH:  Jan 29, 1941 MEDICAL RECORD NUMBER [id number]  [facility]  PHYSICIAN: Oluwadarasimi Redmon D  DATE OF STUDY: 08/30/2013  SLEEP STUDY TYPE: Nocturnal Polysomnogram               REFERRING PHYSICIAN: Andrena Mews, MD  INDICATION FOR STUDY: Hypersomnia with sleep apnea  EPWORTH SLEEPINESS SCORE:   15/24 HEIGHT: 5\' 4"  (162.6 cm)  WEIGHT: 165 lb (74.844 kg)    Body mass index is 28.31 kg/(m^2).  NECK SIZE: 15.5 in.  MEDICATIONS: Charted for review  SLEEP ARCHITECTURE: Total sleep time 258 minutes with sleep efficiency 63%. Stage I was 3.7%, stage II 93%, stage III absent, REM 3.3% of total sleep time. Sleep latency 137.5 minutes, REM latency 138.5 minutes, awake after sleep onset 15.5 minutes, arousal index 2.8. Bedtime medication: Aspirin, Advair, Zyrtec, Clozaril, vitamins, Pravachol, temazepam  RESPIRATORY DATA: Apnea hypopneas index (AHI) 0.7 per hour. 3 total events scored, all as hypopneas in nonsupine position. REM AHI of 14.1 per hour  OXYGEN DATA: Moderate to loud snoring with oxygen desaturation to a nadir of 91% and mean oxygen saturation through the study of 95% on room air  CARDIAC DATA: Sinus rhythm with PACs  MOVEMENT/PARASOMNIA: No significant movement disturbance, no bathroom trips  IMPRESSION/ RECOMMENDATION:   1) Difficulty initiating sleep with sleep onset not sustained until 1 AM. Bedtime medications including temazepam as noted above. 2) Occasional respiratory events with sleep disturbance, within normal limits, AHI 0.7 per hour. The normal range for adults is an AHI from 0-5 events per hour). Moderate to loud snoring with oxygen desaturation to a nadir of 91% and mean oxygen saturation through the study of 95% on room air.   Signed Baird Lyons M.D. Deneise Lever Diplomate, American Board of Sleep Medicine  ELECTRONICALLY SIGNED ON:  09/08/2013, 11:59 AM [place] PH:  [phone]   FX: [phone] [place]

## 2013-09-19 DIAGNOSIS — E119 Type 2 diabetes mellitus without complications: Secondary | ICD-10-CM | POA: Diagnosis not present

## 2013-09-19 DIAGNOSIS — R809 Proteinuria, unspecified: Secondary | ICD-10-CM | POA: Diagnosis not present

## 2013-09-19 DIAGNOSIS — E785 Hyperlipidemia, unspecified: Secondary | ICD-10-CM | POA: Diagnosis not present

## 2013-09-19 DIAGNOSIS — I10 Essential (primary) hypertension: Secondary | ICD-10-CM | POA: Diagnosis not present

## 2013-09-19 DIAGNOSIS — E039 Hypothyroidism, unspecified: Secondary | ICD-10-CM | POA: Diagnosis not present

## 2013-09-25 ENCOUNTER — Other Ambulatory Visit: Payer: Self-pay

## 2013-09-25 DIAGNOSIS — Z0000 Encounter for general adult medical examination without abnormal findings: Secondary | ICD-10-CM | POA: Diagnosis not present

## 2013-09-25 DIAGNOSIS — E039 Hypothyroidism, unspecified: Secondary | ICD-10-CM | POA: Diagnosis not present

## 2013-09-25 DIAGNOSIS — J449 Chronic obstructive pulmonary disease, unspecified: Secondary | ICD-10-CM | POA: Diagnosis not present

## 2013-09-25 DIAGNOSIS — E785 Hyperlipidemia, unspecified: Secondary | ICD-10-CM | POA: Diagnosis not present

## 2013-09-25 DIAGNOSIS — E119 Type 2 diabetes mellitus without complications: Secondary | ICD-10-CM | POA: Diagnosis not present

## 2013-09-25 DIAGNOSIS — Z1231 Encounter for screening mammogram for malignant neoplasm of breast: Secondary | ICD-10-CM

## 2013-09-25 DIAGNOSIS — N390 Urinary tract infection, site not specified: Secondary | ICD-10-CM | POA: Diagnosis not present

## 2013-09-25 DIAGNOSIS — I10 Essential (primary) hypertension: Secondary | ICD-10-CM | POA: Diagnosis not present

## 2013-09-25 DIAGNOSIS — D509 Iron deficiency anemia, unspecified: Secondary | ICD-10-CM | POA: Diagnosis not present

## 2013-09-28 ENCOUNTER — Ambulatory Visit (INDEPENDENT_AMBULATORY_CARE_PROVIDER_SITE_OTHER): Payer: Medicare Other | Admitting: Internal Medicine

## 2013-09-28 ENCOUNTER — Encounter: Payer: Self-pay | Admitting: Internal Medicine

## 2013-09-28 VITALS — BP 140/64 | HR 75 | Ht 63.0 in | Wt 166.2 lb

## 2013-09-28 DIAGNOSIS — I10 Essential (primary) hypertension: Secondary | ICD-10-CM

## 2013-09-28 DIAGNOSIS — I2510 Atherosclerotic heart disease of native coronary artery without angina pectoris: Secondary | ICD-10-CM

## 2013-09-28 DIAGNOSIS — I509 Heart failure, unspecified: Secondary | ICD-10-CM

## 2013-09-28 DIAGNOSIS — I5032 Chronic diastolic (congestive) heart failure: Secondary | ICD-10-CM

## 2013-09-28 DIAGNOSIS — E7800 Pure hypercholesterolemia, unspecified: Secondary | ICD-10-CM

## 2013-09-28 MED ORDER — NITROGLYCERIN 0.4 MG SL SUBL: 0.4000 mg | SUBLINGUAL_TABLET | SUBLINGUAL | Status: DC | PRN

## 2013-09-28 NOTE — Progress Notes (Signed)
09/28/2013   PCP: [name], MD   Chief Complaint  Patient presents with  . 6 month ROV    dyspnea on exertion; a little lightheaded when taking inhalers    Primary Cardiologist:Previously Dr. Rollene Fare now Dr. Debara Pickett  HPI:  73 YOWF presents today with her daughter after developing chest pain, Rt sided yesterday.  Pt had walked- her usual exercise in the morning, later while watching TV she developed Rt side chest pain, the pain then moved to mid sternal area then the rt arm.  Her throat felt strange also.  She had mild nausea.  Her daughter called our office and we instructed her to take NTG sl.  The patient was too afraid.  She took a lozenger that helped her throat.  Pain resolved within 15 min.    She has hx. Of CAD with cath in 2006 and rec'd Taxus DES of the mid RCA.  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  Last lexiscan myoview 09/2012 was low risk scan.  Echo 07/2012 EF 55-60%, mild hypokinesis of inf wall,  Grade 1 diastolic dysfunction.  Other hx as below.  With her negative nuc her Imdur had been d/c'd.  She developed chest pain and it was restarted at 30 mg.  Previously she had been on 60 mg.  At her last office visit, [name] started her back on Imdur 60 mg daily.  She now reports her chest pain has resolved.  Her main complaint at this time include ankle pain and some low back pain. She denies any recurrent chest pain. She had recent laboratory work performed at her primary care doctor's office this past week which shows moderate chronic kidney disease with a GFR of 31. No liver enzyme abnormalities. Well controlled cholesterol at total cholesterol 141, triglycerides 114, [name] 51 LDL 67. Hemoglobin A1c 5.5, TSH 1.7. ApoB is 58.   No Known Allergies  Current Outpatient Prescriptions  Medication Sig Dispense Refill  . ACCU-CHEK AVIVA PLUS test strip TEST BLOOD GLUCOSE ONCE ADAY  50 each  4  . ACCU-CHEK SOFTCLIX LANCETS lancets Use to test blood sugar 4 times  daily.  Dx: 250.92  100 each  5  . acetaminophen (TYLENOL) 500 MG tablet Take 500 mg by mouth every 6 (six) hours as needed for pain (2 tab in AM, as neded q6hours).      Marland Kitchen alendronate (FOSAMAX) 70 MG tablet Take 1 tablet (70 mg total) by mouth every 7 (seven) days. Take with a full glass of water on an empty stomach.  4 tablet  5  . aspirin EC 81 MG tablet Take 162 mg by mouth daily.      . Blood Glucose Monitoring Suppl (ACCU-CHEK AVIVA PLUS) W/DEVICE KIT 1 each by Does not apply route 4 (four) times daily -  before meals and at bedtime.  1 kit  0  . cetirizine (ZYRTEC) 10 MG tablet Take 10 mg by mouth daily.      . ciprofloxacin (CIPRO) 250 MG tablet Take 250 mg by mouth 2 (two) times daily.      . clozapine (CLOZARIL) 100 MG tablet Take 300 mg by mouth at bedtime.       . Cranberry (SM CRANBERRY) 300 MG tablet Take 300 mg by mouth daily.      Marland Kitchen diltiazem (CARDIZEM CD) 180 MG 24 hr capsule take 1 capsule by mouth once daily  30 capsule  11  . docusate sodium (COLACE) 100 MG capsule  Take 1 capsule (100 mg total) by mouth 2 (two) times daily.  10 capsule  0  . ferrous sulfate 325 (65 FE) MG EC tablet Take 325 mg by mouth 2 (two) times daily.        . Fluticasone-Salmeterol (ADVAIR) 500-50 MCG/DOSE AEPB Inhale 1 puff into the lungs at bedtime.      . isosorbide mononitrate (IMDUR) 60 MG 24 hr tablet Take 1 tablet (60 mg total) by mouth daily.  30 tablet  6  . metoprolol succinate (TOPROL-XL) 100 MG 24 hr tablet Take 100 mg by mouth daily. Take with or immediately following a meal.      . Multiple Vitamin (MULTIVITAMIN WITH MINERALS) TABS Take 1 tablet by mouth daily. Psychiatrist      . nitroGLYCERIN (NITROSTAT) 0.4 MG SL tablet Place 1 tablet (0.4 mg total) under the tongue every 5 (five) minutes as needed. For chest pain  25 tablet  3  . Omega-3 Fatty Acids (FISH OIL) 1200 MG CAPS Take 2 capsules by mouth daily.      . pantoprazole (PROTONIX) 40 MG tablet Take 1 tablet (40 mg total) by mouth  daily.  30 tablet  11  . polyethylene glycol powder (GLYCOLAX/MIRALAX) powder Take 17 g by mouth 2 (two) times daily as needed.  3350 g  11  . pravastatin (PRAVACHOL) 20 MG tablet take 1 tablet by mouth at bedtime  30 tablet  11  . predniSONE (DELTASONE) 1 MG tablet Take 7 mg by mouth daily with breakfast.      . PROAIR HFA 108 (90 BASE) MCG/ACT inhaler inhale 1 to 2 puffs by mouth every 4 hours if needed for shortness of breath  8.5 g  5  . Pseudoephedrine-Guaifenesin (GUAIFEN-PSE PO) Take 600 mg by mouth as needed.      Marland Kitchen SYNTHROID 50 MCG tablet take 1 tablet by mouth once daily  30 tablet  11  . tiotropium (SPIRIVA) 18 MCG inhalation capsule Place 18 mcg into inhaler and inhale daily.      . traMADol (ULTRAM) 50 MG tablet take 1 tablet by mouth twice a day if needed SEVERE PAIN  60 tablet  5  . Vitamin D, Ergocalciferol, (DRISDOL) 50000 UNITS CAPS capsule Take 1 capsule (50,000 Units total) by mouth every 7 (seven) days.  4 capsule  0   No current facility-administered medications for this visit.    Past Medical History  Diagnosis Date  . Diabetes mellitus   . Frozen shoulder     right  . Obesity   . Renal insufficiency   . COPD (chronic obstructive pulmonary disease)   . Shortness of breath   . Stroke   . CAD (coronary artery disease) 2006    Taxus stent of mid RCA. negative myoview 2014 and normal LV function  . Diastolic heart failure, NYHA class 1     Past Surgical History  Procedure Laterality Date  . Partial hysterectomy  1995  . Abdominal hysterectomy    . Cholecystectomy    . Coronary angioplasty with stent placement  2006    Taxus DES to RCA, 50 % residual    PQZ:RAQTMAU:QJ colds or fevers, no weight changes Skin:no rashes or ulcers HEENT:no blurred vision, no congestion CV:see HPI PUL:see HPI GI:no diarrhea constipation or melena, no indigestion GU:no hematuria, no dysuria MS:+ joint pain, no claudication Neuro:no syncope, no lightheadedness Endo:+  diabetes, + thyroid disease  PHYSICAL EXAM BP 140/64  Pulse 75  Ht _0  (1.6  m)  Wt 166 lb 3.2 oz (75.388 kg)  BMI 29.45 kg/m2 GEN: Awake, NAD HEENT: PERRLA LUNGS: CLEAR ABD: S/NT EXT: No edema NEURO: Grossly intact PSYCH: Normal mood, affect  EKG: NSR at 75  ASSESSMENT AND PLAN Patient Active Problem List   Diagnosis Date Noted  . COPD exacerbation 07/16/2013  . Snoring 06/29/2013  . Diabetes mellitus, type 2 06/29/2013  . Chest pain at rest 03/14/2013  . Estrogen deficiency 02/06/2013  . Bruising 01/02/2013  . Osteoporosis screening 01/02/2013  . Breast cancer screening 01/02/2013  . Personal history of fall 10/19/2012  . Polymyalgia rheumatica 10/03/2012  . Weakness 09/27/2012  . UTI (urinary tract infection) 09/19/2012  . Trochanteric bursitis of right hip 04/11/2012  . At high risk for falls 02/28/2012  . Lower urinary tract symptoms 01/27/2012  . Chronic constipation 01/26/2012  . Memory impairment 12/01/2010  . History of small bowel obstruction 11/04/2010  . COPD, MILD 07/09/2010  . 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 CONSTIPATION, CHRONIC 12/12/2008  . PRURITUS 03/06/2008  . SCHIZOAFFECTIVE DISORDER 01/10/2008  . GOUT 09/11/2007  . LOW BACK PAIN, CHRONIC 07/05/2007  . HYPERCHOLESTEROLEMIA 01/25/2007  . HYPOTHYROIDISM, UNSPECIFIED 08/11/2006  . OBESITY, NOS 08/11/2006  . ANEMIA, NORMOCYTIC, CHRONIC 08/11/2006  . HYPERTENSION, BENIGN SYSTEMIC 08/11/2006  . CAD 08/11/2006  . HEMORRHOIDS, NOS 08/11/2006  . GASTROESOPHAGEAL REFLUX, NO ESOPHAGITIS 08/11/2006  . RENAL INSUFFICIENCY, CHRONIC 08/11/2006  . ARTHRITIS 08/11/2006   PLAN: 1.  Ms. Misenheimer's is not describing any further chest pain. She has numerous other active complaints, mostly musculoskeletal chest pain. I would recommend she continue her current cardiac medications. I have refilled her PRN nitro as her RX was over 17 year old. Fortunately, she rarely uses it.  Follow-up in 6  months.  Pixie Casino, MD, [facility] Attending Cardiologist [place]

## 2013-09-28 NOTE — Patient Instructions (Signed)
Your physician wants you to follow-up in:  6 months. You will receive a reminder letter in the mail two months in advance. If you don't receive a letter, please call our office to schedule the follow-up appointment.   

## 2013-10-04 ENCOUNTER — Ambulatory Visit: Payer: Self-pay

## 2013-10-04 DIAGNOSIS — Z79899 Other long term (current) drug therapy: Secondary | ICD-10-CM | POA: Diagnosis not present

## 2013-10-08 ENCOUNTER — Ambulatory Visit
Admission: RE | Admit: 2013-10-08 | Discharge: 2013-10-08 | Disposition: A | Discharge status: Home / Self Care | Payer: Medicare Other | Source: Ambulatory Visit

## 2013-10-08 DIAGNOSIS — Z1231 Encounter for screening mammogram for malignant neoplasm of breast: Secondary | ICD-10-CM | POA: Diagnosis not present

## 2013-10-08 DIAGNOSIS — F200 Paranoid schizophrenia: Secondary | ICD-10-CM | POA: Diagnosis not present

## 2013-10-15 DIAGNOSIS — N390 Urinary tract infection, site not specified: Secondary | ICD-10-CM | POA: Diagnosis not present

## 2013-10-16 ENCOUNTER — Telehealth: Payer: Self-pay | Admitting: Internal Medicine

## 2013-10-16 ENCOUNTER — Encounter (HOSPITAL_COMMUNITY): Payer: Self-pay | Admitting: Emergency Medicine

## 2013-10-16 ENCOUNTER — Emergency Department (HOSPITAL_COMMUNITY): Payer: Medicare Other

## 2013-10-16 ENCOUNTER — Observation Stay (HOSPITAL_COMMUNITY)
Admission: EM | Admit: 2013-10-16 | Discharge: 2013-10-18 | Disposition: A | Discharge status: Home Health | Payer: Medicare Other | Attending: Internal Medicine | Admitting: Internal Medicine

## 2013-10-16 ENCOUNTER — Inpatient Hospital Stay (HOSPITAL_COMMUNITY): Payer: Medicare Other

## 2013-10-16 DIAGNOSIS — I2510 Atherosclerotic heart disease of native coronary artery without angina pectoris: Secondary | ICD-10-CM | POA: Diagnosis not present

## 2013-10-16 DIAGNOSIS — J4489 Other specified chronic obstructive pulmonary disease: Secondary | ICD-10-CM

## 2013-10-16 DIAGNOSIS — M7500 Adhesive capsulitis of unspecified shoulder: Secondary | ICD-10-CM | POA: Insufficient documentation

## 2013-10-16 DIAGNOSIS — J441 Chronic obstructive pulmonary disease with (acute) exacerbation: Secondary | ICD-10-CM

## 2013-10-16 DIAGNOSIS — Z6830 Body mass index (BMI) 30.0-30.9, adult: Secondary | ICD-10-CM | POA: Diagnosis not present

## 2013-10-16 DIAGNOSIS — M545 Low back pain, unspecified: Secondary | ICD-10-CM

## 2013-10-16 DIAGNOSIS — E669 Obesity, unspecified: Secondary | ICD-10-CM

## 2013-10-16 DIAGNOSIS — Z87891 Personal history of nicotine dependence: Secondary | ICD-10-CM | POA: Diagnosis not present

## 2013-10-16 DIAGNOSIS — J984 Other disorders of lung: Secondary | ICD-10-CM | POA: Diagnosis not present

## 2013-10-16 DIAGNOSIS — I7389 Other specified peripheral vascular diseases: Secondary | ICD-10-CM | POA: Insufficient documentation

## 2013-10-16 DIAGNOSIS — R399 Unspecified symptoms and signs involving the genitourinary system: Secondary | ICD-10-CM

## 2013-10-16 DIAGNOSIS — Z8673 Personal history of transient ischemic attack (TIA), and cerebral infarction without residual deficits: Secondary | ICD-10-CM | POA: Diagnosis not present

## 2013-10-16 DIAGNOSIS — R413 Other amnesia: Secondary | ICD-10-CM

## 2013-10-16 DIAGNOSIS — E7800 Pure hypercholesterolemia, unspecified: Secondary | ICD-10-CM

## 2013-10-16 DIAGNOSIS — N184 Chronic kidney disease, stage 4 (severe): Secondary | ICD-10-CM | POA: Diagnosis not present

## 2013-10-16 DIAGNOSIS — E119 Type 2 diabetes mellitus without complications: Secondary | ICD-10-CM | POA: Diagnosis not present

## 2013-10-16 DIAGNOSIS — I129 Hypertensive chronic kidney disease with stage 1 through stage 4 chronic kidney disease, or unspecified chronic kidney disease: Secondary | ICD-10-CM | POA: Insufficient documentation

## 2013-10-16 DIAGNOSIS — I10 Essential (primary) hypertension: Secondary | ICD-10-CM

## 2013-10-16 DIAGNOSIS — M353 Polymyalgia rheumatica: Secondary | ICD-10-CM | POA: Diagnosis not present

## 2013-10-16 DIAGNOSIS — R0609 Other forms of dyspnea: Secondary | ICD-10-CM | POA: Diagnosis not present

## 2013-10-16 DIAGNOSIS — Z7982 Long term (current) use of aspirin: Secondary | ICD-10-CM | POA: Insufficient documentation

## 2013-10-16 DIAGNOSIS — M109 Gout, unspecified: Secondary | ICD-10-CM

## 2013-10-16 DIAGNOSIS — L299 Pruritus, unspecified: Secondary | ICD-10-CM

## 2013-10-16 DIAGNOSIS — Z792 Long term (current) use of antibiotics: Secondary | ICD-10-CM | POA: Insufficient documentation

## 2013-10-16 DIAGNOSIS — K219 Gastro-esophageal reflux disease without esophagitis: Secondary | ICD-10-CM

## 2013-10-16 DIAGNOSIS — K649 Unspecified hemorrhoids: Secondary | ICD-10-CM

## 2013-10-16 DIAGNOSIS — Z9181 History of falling: Secondary | ICD-10-CM

## 2013-10-16 DIAGNOSIS — R079 Chest pain, unspecified: Secondary | ICD-10-CM

## 2013-10-16 DIAGNOSIS — G459 Transient cerebral ischemic attack, unspecified: Principal | ICD-10-CM

## 2013-10-16 DIAGNOSIS — R0683 Snoring: Secondary | ICD-10-CM

## 2013-10-16 DIAGNOSIS — E785 Hyperlipidemia, unspecified: Secondary | ICD-10-CM | POA: Diagnosis not present

## 2013-10-16 DIAGNOSIS — I5032 Chronic diastolic (congestive) heart failure: Secondary | ICD-10-CM | POA: Insufficient documentation

## 2013-10-16 DIAGNOSIS — R531 Weakness: Secondary | ICD-10-CM

## 2013-10-16 DIAGNOSIS — D649 Anemia, unspecified: Secondary | ICD-10-CM | POA: Diagnosis not present

## 2013-10-16 DIAGNOSIS — I6350 Cerebral infarction due to unspecified occlusion or stenosis of unspecified cerebral artery: Secondary | ICD-10-CM | POA: Diagnosis not present

## 2013-10-16 DIAGNOSIS — F259 Schizoaffective disorder, unspecified: Secondary | ICD-10-CM

## 2013-10-16 DIAGNOSIS — Z9861 Coronary angioplasty status: Secondary | ICD-10-CM | POA: Insufficient documentation

## 2013-10-16 DIAGNOSIS — H902 Conductive hearing loss, unspecified: Secondary | ICD-10-CM

## 2013-10-16 DIAGNOSIS — N390 Urinary tract infection, site not specified: Secondary | ICD-10-CM

## 2013-10-16 DIAGNOSIS — M129 Arthropathy, unspecified: Secondary | ICD-10-CM

## 2013-10-16 DIAGNOSIS — Z8719 Personal history of other diseases of the digestive system: Secondary | ICD-10-CM

## 2013-10-16 DIAGNOSIS — Z13820 Encounter for screening for osteoporosis: Secondary | ICD-10-CM

## 2013-10-16 DIAGNOSIS — M7061 Trochanteric bursitis, right hip: Secondary | ICD-10-CM

## 2013-10-16 DIAGNOSIS — R29810 Facial weakness: Secondary | ICD-10-CM | POA: Diagnosis not present

## 2013-10-16 DIAGNOSIS — Z1239 Encounter for other screening for malignant neoplasm of breast: Secondary | ICD-10-CM

## 2013-10-16 DIAGNOSIS — I509 Heart failure, unspecified: Secondary | ICD-10-CM | POA: Insufficient documentation

## 2013-10-16 DIAGNOSIS — D72829 Elevated white blood cell count, unspecified: Secondary | ICD-10-CM | POA: Diagnosis not present

## 2013-10-16 DIAGNOSIS — E2839 Other primary ovarian failure: Secondary | ICD-10-CM

## 2013-10-16 DIAGNOSIS — M6281 Muscle weakness (generalized): Secondary | ICD-10-CM | POA: Insufficient documentation

## 2013-10-16 DIAGNOSIS — N19 Unspecified kidney failure: Secondary | ICD-10-CM

## 2013-10-16 DIAGNOSIS — T148XXA Other injury of unspecified body region, initial encounter: Secondary | ICD-10-CM

## 2013-10-16 DIAGNOSIS — E039 Hypothyroidism, unspecified: Secondary | ICD-10-CM

## 2013-10-16 DIAGNOSIS — J449 Chronic obstructive pulmonary disease, unspecified: Secondary | ICD-10-CM

## 2013-10-16 DIAGNOSIS — K5909 Other constipation: Secondary | ICD-10-CM

## 2013-10-16 DIAGNOSIS — R51 Headache: Secondary | ICD-10-CM | POA: Insufficient documentation

## 2013-10-16 HISTORY — DX: Polymyalgia rheumatica: M35.3

## 2013-10-16 LAB — COMPREHENSIVE METABOLIC PANEL
ALBUMIN: 3.2 g/dL — AB (ref 3.5–5.2)
ALT: 19 U/L (ref 0–35)
AST: 15 U/L (ref 0–37)
Alkaline Phosphatase: 39 U/L (ref 39–117)
BUN: 37 mg/dL — ABNORMAL HIGH (ref 6–23)
CO2: 24 meq/L (ref 19–32)
CREATININE: 1.8 mg/dL — AB (ref 0.50–1.10)
Calcium: 10 mg/dL (ref 8.4–10.5)
Chloride: 93 mEq/L — ABNORMAL LOW (ref 96–112)
GFR calc Af Amer: 31 mL/min — ABNORMAL LOW (ref >90)
GFR calc non Af Amer: 27 mL/min — ABNORMAL LOW (ref >90)
Glucose, Bld: 131 mg/dL — ABNORMAL HIGH (ref 70–99)
Potassium: 4.5 mEq/L (ref 3.7–5.3)
SODIUM: 131 meq/L — AB (ref 137–147)
TOTAL PROTEIN: 6.5 g/dL (ref 6.0–8.3)
Total Bilirubin: 0.3 mg/dL (ref 0.3–1.2)

## 2013-10-16 LAB — I-STAT TROPONIN, ED: Troponin i, poc: 0.02 ng/mL (ref 0.00–0.08)

## 2013-10-16 LAB — CBC
HCT: 30.6 % — ABNORMAL LOW (ref 36.0–46.0)
HCT: 29 % — ABNORMAL LOW (ref 36.0–46.0)
Hemoglobin: 10.3 g/dL — ABNORMAL LOW (ref 12.0–15.0)
Hemoglobin: 9.9 g/dL — ABNORMAL LOW (ref 12.0–15.0)
MCH: 31.1 pg (ref 26.0–34.0)
MCH: 31.3 pg (ref 26.0–34.0)
MCHC: 33.7 g/dL (ref 30.0–36.0)
MCHC: 34.1 g/dL (ref 30.0–36.0)
MCV: 92.4 fL (ref 78.0–100.0)
MCV: 91.8 fL (ref 78.0–100.0)
PLATELETS: 221 10*3/uL (ref 150–400)
PLATELETS: 185 10*3/uL (ref 150–400)
RBC: 3.31 MIL/uL — ABNORMAL LOW (ref 3.87–5.11)
RBC: 3.16 MIL/uL — ABNORMAL LOW (ref 3.87–5.11)
RDW: 13.4 % (ref 11.5–15.5)
RDW: 13.4 % (ref 11.5–15.5)
WBC: 16 10*3/uL — ABNORMAL HIGH (ref 4.0–10.5)
WBC: 14.7 10*3/uL — AB (ref 4.0–10.5)

## 2013-10-16 LAB — DIFFERENTIAL
BASOS ABS: 0 10*3/uL (ref 0.0–0.1)
BASOS PCT: 0 % (ref 0–1)
EOS ABS: 0.3 10*3/uL (ref 0.0–0.7)
EOS PCT: 2 % (ref 0–5)
LYMPHS PCT: 15 % (ref 12–46)
Lymphs Abs: 2.4 10*3/uL (ref 0.7–4.0)
MONO ABS: 1 10*3/uL (ref 0.1–1.0)
Monocytes Relative: 6 % (ref 3–12)
Neutro Abs: 12.3 10*3/uL — ABNORMAL HIGH (ref 1.7–7.7)
Neutrophils Relative %: 77 % (ref 43–77)

## 2013-10-16 LAB — GLUCOSE, CAPILLARY: Glucose-Capillary: 161 mg/dL — ABNORMAL HIGH (ref 70–99)

## 2013-10-16 LAB — PROTIME-INR
INR: 0.98 (ref 0.00–1.49)
Prothrombin Time: 12.8 seconds (ref 11.6–15.2)

## 2013-10-16 LAB — CBG MONITORING, ED
GLUCOSE-CAPILLARY: 120 mg/dL — AB (ref 70–99)
GLUCOSE-CAPILLARY: 132 mg/dL — AB (ref 70–99)

## 2013-10-16 LAB — CREATININE, SERUM
Creatinine, Ser: 1.68 mg/dL — ABNORMAL HIGH (ref 0.50–1.10)
GFR, EST AFRICAN AMERICAN: 33 mL/min — AB (ref >90)
GFR, EST NON AFRICAN AMERICAN: 29 mL/min — AB (ref >90)

## 2013-10-16 LAB — APTT: aPTT: 30 seconds (ref 24–37)

## 2013-10-16 MED ORDER — POLYETHYLENE GLYCOL 3350 17 G PO PACK
17.0000 g | PACK | Freq: Every day | ORAL | Status: DC | PRN
  Filled 2013-10-16: qty 1

## 2013-10-16 MED ORDER — CLOZAPINE 100 MG PO TABS
300.0000 mg | ORAL_TABLET | Freq: Every day | ORAL | Status: DC
  Administered 2013-10-16 – 2013-10-17 (×2): 300 mg via ORAL
  Filled 2013-10-16 (×3): qty 3

## 2013-10-16 MED ORDER — FERROUS SULFATE 325 (65 FE) MG PO TBEC: 325.0000 mg | DELAYED_RELEASE_TABLET | Freq: Two times a day (BID) | ORAL | Status: DC

## 2013-10-16 MED ORDER — DILTIAZEM HCL ER 180 MG PO CP24
180.0000 mg | ORAL_CAPSULE | Freq: Every day | ORAL | Status: DC
  Administered 2013-10-17 – 2013-10-18 (×2): 180 mg via ORAL
  Filled 2013-10-16 (×2): qty 1

## 2013-10-16 MED ORDER — LORATADINE 10 MG PO TABS
10.0000 mg | ORAL_TABLET | Freq: Every day | ORAL | Status: DC
  Administered 2013-10-17 – 2013-10-18 (×2): 10 mg via ORAL
  Filled 2013-10-16 (×2): qty 1

## 2013-10-16 MED ORDER — SENNOSIDES-DOCUSATE SODIUM 8.6-50 MG PO TABS
1.0000 | ORAL_TABLET | Freq: Every evening | ORAL | Status: DC | PRN
  Filled 2013-10-16: qty 1

## 2013-10-16 MED ORDER — PREDNISONE 5 MG PO TABS
6.0000 mg | ORAL_TABLET | Freq: Every day | ORAL | Status: DC
  Administered 2013-10-17 – 2013-10-18 (×2): 6 mg via ORAL
  Filled 2013-10-16 (×3): qty 1

## 2013-10-16 MED ORDER — ISOSORBIDE MONONITRATE ER 60 MG PO TB24
60.0000 mg | ORAL_TABLET | Freq: Every day | ORAL | Status: DC
  Administered 2013-10-17 – 2013-10-18 (×2): 60 mg via ORAL
  Filled 2013-10-16 (×2): qty 1

## 2013-10-16 MED ORDER — ASPIRIN 300 MG RE SUPP
300.0000 mg | Freq: Every day | RECTAL | Status: DC
  Filled 2013-10-16: qty 1

## 2013-10-16 MED ORDER — ADULT MULTIVITAMIN W/MINERALS CH
1.0000 | ORAL_TABLET | Freq: Every day | ORAL | Status: DC
  Administered 2013-10-16 – 2013-10-17 (×2): 1 via ORAL
  Filled 2013-10-16 (×3): qty 1

## 2013-10-16 MED ORDER — OMEGA-3-ACID ETHYL ESTERS 1 G PO CAPS
1.0000 g | ORAL_CAPSULE | Freq: Every day | ORAL | Status: DC
  Administered 2013-10-17 – 2013-10-18 (×2): 1 g via ORAL
  Filled 2013-10-16 (×2): qty 1

## 2013-10-16 MED ORDER — TRAMADOL HCL 50 MG PO TABS
50.0000 mg | ORAL_TABLET | Freq: Two times a day (BID) | ORAL | Status: DC | PRN
  Administered 2013-10-17: 50 mg via ORAL
  Filled 2013-10-16: qty 1

## 2013-10-16 MED ORDER — MOMETASONE FURO-FORMOTEROL FUM 100-5 MCG/ACT IN AERO
2.0000 | INHALATION_SPRAY | Freq: Two times a day (BID) | RESPIRATORY_TRACT | Status: DC
  Administered 2013-10-17: 2 via RESPIRATORY_TRACT
  Filled 2013-10-16 (×2): qty 8.8

## 2013-10-16 MED ORDER — ENOXAPARIN SODIUM 30 MG/0.3ML ~~LOC~~ SOLN
30.0000 mg | SUBCUTANEOUS | Status: DC
  Administered 2013-10-16 – 2013-10-17 (×2): 30 mg via SUBCUTANEOUS
  Filled 2013-10-16 (×3): qty 0.3

## 2013-10-16 MED ORDER — ASPIRIN 325 MG PO TABS
325.0000 mg | ORAL_TABLET | Freq: Every day | ORAL | Status: DC
  Administered 2013-10-16 – 2013-10-17 (×2): 325 mg via ORAL
  Filled 2013-10-16 (×2): qty 1

## 2013-10-16 MED ORDER — LEVOTHYROXINE SODIUM 50 MCG PO TABS
50.0000 ug | ORAL_TABLET | Freq: Every day | ORAL | Status: DC
  Administered 2013-10-17 – 2013-10-18 (×2): 50 ug via ORAL
  Filled 2013-10-16 (×3): qty 1

## 2013-10-16 MED ORDER — PANTOPRAZOLE SODIUM 40 MG PO TBEC
40.0000 mg | DELAYED_RELEASE_TABLET | Freq: Every day | ORAL | Status: DC
  Administered 2013-10-16 – 2013-10-18 (×3): 40 mg via ORAL
  Filled 2013-10-16 (×2): qty 1

## 2013-10-16 MED ORDER — INSULIN ASPART 100 UNIT/ML ~~LOC~~ SOLN: 0.0000 [IU] | Freq: Three times a day (TID) | SUBCUTANEOUS | Status: DC

## 2013-10-16 MED ORDER — POLYETHYL GLYCOL-PROPYL GLYCOL 0.4-0.3 % OP SOLN: 1.0000 [drp] | Freq: Two times a day (BID) | OPHTHALMIC | Status: DC | PRN

## 2013-10-16 MED ORDER — FERROUS SULFATE 325 (65 FE) MG PO TABS
325.0000 mg | ORAL_TABLET | Freq: Two times a day (BID) | ORAL | Status: DC
  Administered 2013-10-17 – 2013-10-18 (×3): 325 mg via ORAL
  Filled 2013-10-16 (×5): qty 1

## 2013-10-16 MED ORDER — VITAMIN D (ERGOCALCIFEROL) 1.25 MG (50000 UNIT) PO CAPS: 50000.0000 [IU] | ORAL_CAPSULE | ORAL | Status: DC

## 2013-10-16 MED ORDER — POLYVINYL ALCOHOL 1.4 % OP SOLN
1.0000 [drp] | Freq: Two times a day (BID) | OPHTHALMIC | Status: DC | PRN
  Filled 2013-10-16: qty 15

## 2013-10-16 MED ORDER — ALBUTEROL SULFATE HFA 108 (90 BASE) MCG/ACT IN AERS: 2.0000 | INHALATION_SPRAY | Freq: Four times a day (QID) | RESPIRATORY_TRACT | Status: DC | PRN

## 2013-10-16 MED ORDER — METOPROLOL SUCCINATE ER 100 MG PO TB24
100.0000 mg | ORAL_TABLET | Freq: Every day | ORAL | Status: DC
  Administered 2013-10-17 – 2013-10-18 (×2): 100 mg via ORAL
  Filled 2013-10-16 (×2): qty 1

## 2013-10-16 MED ORDER — TIOTROPIUM BROMIDE MONOHYDRATE 18 MCG IN CAPS
18.0000 ug | ORAL_CAPSULE | Freq: Every day | RESPIRATORY_TRACT | Status: DC
  Filled 2013-10-16 (×2): qty 5

## 2013-10-16 MED ORDER — ALENDRONATE SODIUM 70 MG PO TABS: 70.0000 mg | ORAL_TABLET | ORAL | Status: DC

## 2013-10-16 MED ORDER — SIMVASTATIN 10 MG PO TABS
10.0000 mg | ORAL_TABLET | Freq: Every day | ORAL | Status: DC
  Administered 2013-10-17: 10 mg via ORAL
  Filled 2013-10-16 (×2): qty 1

## 2013-10-16 MED ORDER — ALBUTEROL SULFATE (2.5 MG/3ML) 0.083% IN NEBU
2.5000 mg | INHALATION_SOLUTION | Freq: Four times a day (QID) | RESPIRATORY_TRACT | Status: DC | PRN
Start: 2013-10-16 — End: 2013-10-18
  Administered 2013-10-17: 2.5 mg via RESPIRATORY_TRACT
  Filled 2013-10-16: qty 3

## 2013-10-16 MED ORDER — NITROGLYCERIN 0.4 MG SL SUBL: 0.4000 mg | SUBLINGUAL_TABLET | SUBLINGUAL | Status: DC | PRN

## 2013-10-16 MED ORDER — TEMAZEPAM 7.5 MG PO CAPS
15.0000 mg | ORAL_CAPSULE | Freq: Every day | ORAL | Status: DC
  Administered 2013-10-16 – 2013-10-17 (×2): 15 mg via ORAL
  Filled 2013-10-16 (×2): qty 2

## 2013-10-16 MED ORDER — ACETAMINOPHEN 500 MG PO TABS
1000.0000 mg | ORAL_TABLET | Freq: Four times a day (QID) | ORAL | Status: DC | PRN
  Administered 2013-10-17: 1000 mg via ORAL
  Filled 2013-10-16: qty 2

## 2013-10-16 NOTE — ED Notes (Signed)
Dr. Nicole Kindred at the bedside with daughter of pt.

## 2013-10-16 NOTE — ED Notes (Signed)
Pt in MRI, MRI called to inform of new room for pt.

## 2013-10-16 NOTE — ED Notes (Signed)
Patient transported to X-ray 

## 2013-10-16 NOTE — ED Notes (Signed)
Pt. Returned from MRI 

## 2013-10-16 NOTE — Code Documentation (Addendum)
73 yo female who was not feeling well this am, "moving a little slow" per her dtr, and with c/p headache yesterday. However, she went to adult day care, did minimal participation. Her dtr took her home and put her in bed at 1415 while her dtr went for radiation therapy.  When she returned at  1500 she found the pt "gray" and drooling from the L side of her mouth. R facial droop noted. The pt said she feld "drugged" and was having diff talking. Pt is on prednisone taper for PMR (currently at 6mg ). She is takes 2 81mg  ASA Q HS. Her dtr brought her to the ED arriving at 1530. Code stroke was activated at 1533. CT was unremarkable.  NIHSS is 1 (she missed the month saying it was April). Greatly improved per dtr.BP 120/48; NS bolus started. Plan to adm for TIA w/u. Handoff done with EDRN.

## 2013-10-16 NOTE — Telephone Encounter (Signed)
Spoke w/ [name], pt's daughter.  Stated pt w/: Drooling.  Smile not full.  Feels sluggish. Speech slurred.  Walk unsteady.  Asked if pt could come in to office to be seen.  Advised daughter to call 911 or take to ER immediately for evaluation as these are signs of a stroke.  Daughter again asked if pt could come into the office.  RN explained that w/ signs of a stroke, pt should be seen in ER immediately for evaluation as we cannot care for her the way she needs in office.  Verbalized understanding and agreed w/ plan.  Message forwarded to Dr. Debara Pickett [facility]).

## 2013-10-16 NOTE — Telephone Encounter (Signed)
Said something going on.  Mother not feeling well since yesterday.  Has headache and drooling, slight smile droop.  Trouble completing sentences and says feels drugged.  Wants to know if she can bring her in to be seen.

## 2013-10-16 NOTE — ED Notes (Signed)
Sx are resolving since arrival.  Right nasal fold has returned to normal presentation.  She continues to have slower speech than normal.

## 2013-10-16 NOTE — Telephone Encounter (Signed)
Pt's daughter is calling wanting to establish dr. Regis Bill as her mothers primary pcp. Daughter is poa for pt. Pt has medicare/medicaid/and tricare. Wants to know if dr. Regis Bill will except her as a new pt.

## 2013-10-16 NOTE — H&P (Addendum)
Hospitalist Admission History and Physical  Patient name: Rebecca [name] Medical record number: [id number] Date of birth: January 02, 1941 Age: 73 y.o. Gender: female  Primary Care Provider: [name], MD  Chief Complaint: TIA History of Present Illness:This is a 73 y.o. year old female with prior hx/o TIA, COPD, type 2 DM, PMR on prednisone taper, HTN, CAD s/p stent , diastolic dysfunction presenting with TIA. Pt with noted new onset L sided facial droop with weakness and drooling earlier in the day. Was subsequently brought to the ER because of worsening sxs. Sxs had resolved by time of presentation.  Had head CT that was WNL. Neuro consulted. Sxs felt to be TIA. Was on ASA 180 mg prior to presentation.  Hemodynamically stable on presentation. WBC @ 16. Hgb 10. Cr 1.8  Patient Active Problem List   Diagnosis Date Noted  . TIA (transient ischemic attack) 10/16/2013  . COPD exacerbation 07/16/2013  . Snoring 06/29/2013  . Diabetes mellitus, type 2 06/29/2013  . Chest pain at rest 03/14/2013  . Estrogen deficiency 02/06/2013  . Bruising 01/02/2013  . Osteoporosis screening 01/02/2013  . Breast cancer screening 01/02/2013  . Personal history of fall 10/19/2012  . Polymyalgia rheumatica 10/03/2012  . Weakness 09/27/2012  . UTI (urinary tract infection) 09/19/2012  . Trochanteric bursitis of right hip 04/11/2012  . At high risk for falls 02/28/2012  . Lower urinary tract symptoms 01/27/2012  . Chronic constipation 01/26/2012  . Memory impairment 12/01/2010  . History of small bowel obstruction 11/04/2010  . COPD, MILD 07/09/2010  . 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 CONSTIPATION, CHRONIC 12/12/2008  . PRURITUS 03/06/2008  . SCHIZOAFFECTIVE DISORDER 01/10/2008  . GOUT 09/11/2007  . LOW BACK PAIN, CHRONIC 07/05/2007  . HYPERCHOLESTEROLEMIA 01/25/2007  . HYPOTHYROIDISM, UNSPECIFIED 08/11/2006  . OBESITY, NOS 08/11/2006  . ANEMIA, NORMOCYTIC, CHRONIC 08/11/2006  .  HYPERTENSION, BENIGN SYSTEMIC 08/11/2006  . CAD 08/11/2006  . HEMORRHOIDS, NOS 08/11/2006  . GASTROESOPHAGEAL REFLUX, NO ESOPHAGITIS 08/11/2006  . RENAL INSUFFICIENCY, CHRONIC 08/11/2006  . ARTHRITIS 08/11/2006   Past Medical History: Past Medical History  Diagnosis Date  . Diabetes mellitus   . Frozen shoulder     right  . Obesity   . Renal insufficiency   . COPD (chronic obstructive pulmonary disease)   . Shortness of breath   . Stroke   . CAD (coronary artery disease) 2006    Taxus stent of mid RCA. negative myoview 2014 and normal LV function  . Diastolic heart failure, NYHA class 1   . Polymyalgia rheumatica     Past Surgical History: Past Surgical History  Procedure Laterality Date  . Partial hysterectomy  1995  . Abdominal hysterectomy    . Cholecystectomy    . Coronary angioplasty with stent placement  2006    Taxus DES to RCA, 50 % residual    Social History: History   Social History  . Marital Status: Divorced    Spouse Name: N/A    Number of Children: N/A  . Years of Education: N/A   Social History Main Topics  . Smoking status: Former Smoker -- 1.00 packs/day for 35 years    Quit date: 04/14/2004  . Smokeless tobacco: None  . Alcohol Use: No  . Drug Use: No  . Sexual Activity: None   Other Topics Concern  . None   Social History Narrative   Daughter [name] and son are very involved.  [name] comes to most office visits.   Recent resident at [facility]; daughter unhappy with care and taking her to live at home, after Jan 30th. Daughter is HCPOA, not averse to SNF/rehab in short-term, but does not wish pt to return to [facility] and requests that pt/pt's care not be discussed by [facility] providers to [facility], moving forward.    Family History: Family History  Problem Relation Age of Onset  . Diabetes Mother   . Heart disease Mother   . Heart disease Father   . Arthritis Father   . Healthy Sister   . Heart disease Brother    . Osteoporosis Sister   . Heart disease Brother   . Diabetes Brother   . Heart disease Brother   . Diabetes Brother   . Diabetes Brother   . Healthy Brother   . Healthy Brother   . Healthy Brother     Allergies: No Known Allergies  No current facility-administered medications for this encounter.   Current Outpatient Prescriptions  Medication Sig Dispense Refill  . acetaminophen (TYLENOL) 500 MG tablet Take 1,000 mg by mouth every 6 (six) hours as needed (pain). 2 tablets every morning and then 6 hours as needed      . albuterol (PROVENTIL HFA;VENTOLIN HFA) 108 (90 BASE) MCG/ACT inhaler Inhale 2 puffs into the lungs every 6 (six) hours as needed for wheezing or shortness of breath.      Marland Kitchen alendronate (FOSAMAX) 70 MG tablet Take 1 tablet (70 mg total) by mouth every 7 (seven) days. Take with a full glass of water on an empty stomach.  4 tablet  5  . aspirin EC 81 MG tablet Take 162 mg by mouth daily.      . cetirizine (ZYRTEC) 10 MG tablet Take 10 mg by mouth at bedtime.       . clozapine (CLOZARIL) 200 MG tablet Take 300 mg by mouth at bedtime. Take 1.5 tablets (300 mg) at bedtime      . Cranberry (SM CRANBERRY) 300 MG tablet Take 300 mg by mouth at bedtime.       Marland Kitchen diltiazem (DILACOR XR) 180 MG 24 hr capsule Take 180 mg by mouth daily.      Marland Kitchen docusate sodium (COLACE) 100 MG capsule Take 200 mg by mouth at bedtime.   10 capsule  0  . ferrous sulfate 325 (65 FE) MG EC tablet Take 325 mg by mouth 2 (two) times daily.        . Fluticasone-Salmeterol (ADVAIR) 250-50 MCG/DOSE AEPB Inhale 1 puff into the lungs at bedtime.      . isosorbide mononitrate (IMDUR) 60 MG 24 hr tablet Take 1 tablet (60 mg total) by mouth daily.  30 tablet  6  . levothyroxine (SYNTHROID, LEVOTHROID) 50 MCG tablet Take 50 mcg by mouth daily before breakfast.      . metoprolol succinate (TOPROL-XL) 100 MG 24 hr tablet Take 100 mg by mouth daily. Take with or immediately following a meal.      . Multiple Vitamin  (MULTIVITAMIN WITH MINERALS) TABS Take 1 tablet by mouth at bedtime. Psychiatrist      . Omega-3 Fatty Acids (FISH OIL) 1200 MG CAPS Take 1-2 capsules by mouth at bedtime.       . pantoprazole (PROTONIX) 40 MG tablet Take 1 tablet (40 mg total) by mouth daily.  30 tablet  11  . Polyethyl Glycol-Propyl Glycol (SYSTANE OP) Apply 1 drop to eye 2 (two) times daily as needed (dry eyes).      . polyethylene  glycol (MIRALAX / GLYCOLAX) packet Take 17 g by mouth daily as needed (constipation).      . pravastatin (PRAVACHOL) 20 MG tablet Take 20 mg by mouth at bedtime.      . predniSONE (DELTASONE) 1 MG tablet Take 6 mg by mouth daily with breakfast.       . Pseudoephedrine-Guaifenesin (GUAIFEN-PSE PO) Take 600 mg by mouth daily as needed (congestion).       . temazepam (RESTORIL) 15 MG capsule Take 15 mg by mouth at bedtime.      Marland Kitchen tiotropium (SPIRIVA) 18 MCG inhalation capsule Place 18 mcg into inhaler and inhale daily.      . traMADol (ULTRAM) 50 MG tablet Take 50-100 mg by mouth 2 (two) times daily as needed for severe pain.      . Vitamin D, Ergocalciferol, (DRISDOL) 50000 UNITS CAPS capsule Take 1 capsule (50,000 Units total) by mouth every 7 (seven) days.  4 capsule  0  . nitroGLYCERIN (NITROSTAT) 0.4 MG SL tablet Place 1 tablet (0.4 mg total) under the tongue every 5 (five) minutes as needed. For chest pain  25 tablet  3   Review Of Systems: 12 point ROS negative except as noted above in HPI.  Physical Exam: Filed Vitals:   10/16/13 1701  BP: 135/61  Pulse:   Temp:   Resp: 16    General: alert and cooperative HEENT: PERRLA and extra ocular movement intact Heart: S1, S2 normal, no murmur, rub or gallop, regular rate and rhythm Lungs: clear to auscultation, no wheezes or rales and unlabored breathing Abdomen: abdomen is soft without significant tenderness, masses, organomegaly or guarding Extremities: extremities normal, atraumatic, no cyanosis or edema Skin:no rashes, no  ecchymoses Neurology: normal without focal findings  Labs and Imaging: Lab Results  Component Value Date/Time   NA 131* 10/16/2013  3:53 PM   NA 137 06/21/2012   K 4.5 10/16/2013  3:53 PM   CL 93* 10/16/2013  3:53 PM   CO2 24 10/16/2013  3:53 PM   BUN 37* 10/16/2013  3:53 PM   BUN 33* 06/21/2012   CREATININE 1.80* 10/16/2013  3:53 PM   CREATININE 1.85* 07/16/2013  2:50 PM   CREATININE 1.6* 06/21/2012   GLUCOSE 131* 10/16/2013  3:53 PM   Lab Results  Component Value Date   WBC 16.0* 10/16/2013   HGB 10.3* 10/16/2013   HCT 30.6* 10/16/2013   MCV 92.4 10/16/2013   PLT 221 10/16/2013    Ct Head (brain) Wo Contrast  10/16/2013   CLINICAL DATA:  Code stroke.  Facial droop.  Left hand weakness.  EXAM: CT HEAD WITHOUT CONTRAST  TECHNIQUE: Contiguous axial images were obtained from the base of the skull through the vertex without contrast.  COMPARISON:  02/25/2012.  FINDINGS: No evidence for acute infarction, hemorrhage, mass lesion, hydrocephalus, or extra-axial fluid. Mild atrophy. Mild chronic microvascular ischemic change. Remote right cerebellar infarct. No large vessel infarct of the cerebral hemispheres. Negative orbits, sinuses, and mastoids. Similar appearance to priors.  IMPRESSION: No acute intracranial abnormality.  Chronic changes as described.  Critical Value/emergent results were called by telephone at the time of interpretation on 10/16/2013 at 4:15 PM to Dr. Wallie Char, who verbally acknowledged these results.   Electronically Signed   By: Rolla Flatten M.D.   On: 10/16/2013 16:17     Assessment and Plan: Rebecca [name] is a 73 y.o. year old female presenting with TIA   TIA: proceed down stroke pathway with MRI/MRA brain, 2d  ECHO carotid dopplers, risk stratification labs. Neuro consulted. Appreciate input. Full dose ASA. Continue to follow.   Leukocytosis: likely secondary to recent prednisone burst. No signs of infection currently. Will continue to follow.   CKD:  Baseline Cr 1.5-1.8. Seemingly at  baseline. Continue home regimen. Continue to follow.   HTN/CAD/diastolic dysfunction: clinically at baseline. Continue home regimen.   COPD: No resp distress/increased wob. At baseline. Continue home regimen.   PMR: complete prednisone taper.   DM: SSI. A1C.   FEN/GI: heart healthy/carb modified diet pending bedside swallow eval.  Prophylaxis: lovenox  Disposition: pending further evaluation  Code Status:Full Code.        Shanda Howells MD  Pager: [phone]

## 2013-10-16 NOTE — ED Notes (Signed)
Pt has not arrived to room as of yet - remains in MRI.

## 2013-10-16 NOTE — ED Notes (Signed)
Is back from xray.

## 2013-10-16 NOTE — ED Notes (Signed)
Patient with reported onset of facial weakness and drooling from the left side but family states the right side of her mouth did not move.  She also had headache on the right posterior side.  Patient with decreased sx at time of arrival to ED.  Patient does have a hx of tia.  Patient with increasing weakness all over with slow/unsteady gait this afternoon.  Patient states she has never felt this way before.  Patient with difficulty urinating on yesterday.  Patient had ua done yesterday that was negative.  Patient has ongoing weakness in the right side of her face.  She states she has some numbness in her left arm that has resolved.  She denies chest pain.  Daughter states the patient does have a hx of heart disease.  cbg  132.  Will activate code stroke based on daughters findings.

## 2013-10-16 NOTE — ED Provider Notes (Signed)
CSN: [id number]     Arrival date & time 10/16/13  34 History   First MD Initiated Contact with Patient 10/16/13 1603     Chief Complaint  Patient presents with  . Weakness     (Consider location/radiation/quality/duration/timing/severity/associated sxs/prior Treatment) HPI  This is a 73 year old female with a history of diabetes, renal insufficiency, COPD, stroke, and coronary artery disease he presents with right facial droop and drooling. Per the patient's daughter, she was last seen normal at 2:15 PM. When the daughter went her up at 3 PM, she needed to length and right facial droop. Patient's symptoms seem to have improved upon arrival. Triage nurse noted mild right facial droop and code stroke was initiated.  Patient complains of generalized weakness. She reports difficulty urinating yesterday. She states "I just don't feel right." She denies any chest pain or shortness of breath, abdominal pain. Daughter does state that she noted an unsteady gait just prior to patient and her mother and the bag. She did not have any one-sided weakness or speech difficulties.  Past Medical History  Diagnosis Date  . Diabetes mellitus   . Frozen shoulder     right  . Obesity   . Renal insufficiency   . COPD (chronic obstructive pulmonary disease)   . Shortness of breath   . Stroke   . CAD (coronary artery disease) 2006    Taxus stent of mid RCA. negative myoview 2014 and normal LV function  . Diastolic heart failure, NYHA class 1   . Polymyalgia rheumatica    Past Surgical History  Procedure Laterality Date  . Partial hysterectomy  1995  . Abdominal hysterectomy    . Cholecystectomy    . Coronary angioplasty with stent placement  2006    Taxus DES to RCA, 50 % residual   Family History  Problem Relation Age of Onset  . Diabetes Mother   . Heart disease Mother   . Heart disease Father   . Arthritis Father   . Healthy Sister   . Heart disease Brother   . Osteoporosis Sister   . Heart  disease Brother   . Diabetes Brother   . Heart disease Brother   . Diabetes Brother   . Diabetes Brother   . Healthy Brother   . Healthy Brother   . Healthy Brother    History  Substance Use Topics  . Smoking status: Former Smoker -- 1.00 packs/day for 35 years    Quit date: 04/14/2004  . Smokeless tobacco: Not on file  . Alcohol Use: No   OB History   Grav Para Term Preterm Abortions TAB SAB Ect Mult Living                 Review of Systems  Constitutional: Negative for fever.  Respiratory: Negative for cough, chest tightness and shortness of breath.   Cardiovascular: Negative for chest pain.  Gastrointestinal: Negative for nausea, vomiting and abdominal pain.  Genitourinary: Positive for dysuria and decreased urine volume.  Musculoskeletal: Negative for back pain.  Skin: Negative for wound.  Neurological: Positive for facial asymmetry and headaches. Negative for dizziness, weakness and numbness.  Psychiatric/Behavioral: Negative for confusion.  All other systems reviewed and are negative.     Allergies  Review of patient's allergies indicates no known allergies.  Home Medications   Prior to Admission medications   Medication Sig Start Date End Date Taking? Authorizing Provider  ACCU-CHEK AVIVA PLUS test strip TEST BLOOD GLUCOSE ONCE ADAY 10/05/12  Katherina Mires, MD  ACCU-CHEK [facility] LANCETS lancets Use to test blood sugar 4 times daily.  Dx: 250.92 02/01/13   Andrena Mews, MD  acetaminophen (TYLENOL) 500 MG tablet Take 500 mg by mouth every 6 (six) hours as needed for pain (2 tab in AM, as neded q6hours).    Historical Provider, MD  alendronate (FOSAMAX) 70 MG tablet Take 1 tablet (70 mg total) by mouth every 7 (seven) days. Take with a full glass of water on an empty stomach. 06/29/13   Andrena Mews, MD  aspirin EC 81 MG tablet Take 162 mg by mouth daily.    Historical Provider, MD  Blood Glucose Monitoring Suppl (ACCU-CHEK AVIVA PLUS) W/DEVICE KIT 1 each by  Does not apply route 4 (four) times daily -  before meals and at bedtime. 07/15/12   Gerda Diss, DO  cetirizine (ZYRTEC) 10 MG tablet Take 10 mg by mouth daily.    Historical Provider, MD  ciprofloxacin (CIPRO) 250 MG tablet Take 250 mg by mouth 2 (two) times daily.    Historical Provider, MD  clozapine (CLOZARIL) 100 MG tablet Take 300 mg by mouth at bedtime.  03/01/12   Katherina Mires, MD  Cranberry (SM CRANBERRY) 300 MG tablet Take 300 mg by mouth daily.    Historical Provider, MD  diltiazem (CARDIZEM CD) 180 MG 24 hr capsule take 1 capsule by mouth once daily    Andrena Mews, MD  docusate sodium (COLACE) 100 MG capsule Take 1 capsule (100 mg total) by mouth 2 (two) times daily. 03/01/12   Katherina Mires, MD  ferrous sulfate 325 (65 FE) MG EC tablet Take 325 mg by mouth 2 (two) times daily.      Historical Provider, MD  Fluticasone-Salmeterol (ADVAIR) 500-50 MCG/DOSE AEPB Inhale 1 puff into the lungs at bedtime.    Historical Provider, MD  isosorbide mononitrate (IMDUR) 60 MG 24 hr tablet Take 1 tablet (60 mg total) by mouth daily. 03/14/13   [name], NP  metoprolol succinate (TOPROL-XL) 100 MG 24 hr tablet Take 100 mg by mouth daily. Take with or immediately following a meal.    Historical Provider, MD  Multiple Vitamin (MULTIVITAMIN WITH MINERALS) TABS Take 1 tablet by mouth daily. Teacher, English as a foreign language, MD  nitroGLYCERIN (NITROSTAT) 0.4 MG SL tablet Place 1 tablet (0.4 mg total) under the tongue every 5 (five) minutes as needed. For chest pain 09/28/13   Pixie Casino, MD  Omega-3 Fatty Acids (FISH OIL) 1200 MG CAPS Take 2 capsules by mouth daily.    Historical Provider, MD  pantoprazole (PROTONIX) 40 MG tablet Take 1 tablet (40 mg total) by mouth daily. 04/03/13   Pixie Casino, MD  polyethylene glycol powder (GLYCOLAX/MIRALAX) powder Take 17 g by mouth 2 (two) times daily as needed. 07/18/12   Katherina Mires, MD  pravastatin (PRAVACHOL) 20 MG tablet take 1 tablet by  mouth at bedtime 07/17/13   Josalyn C Funches, MD  predniSONE (DELTASONE) 1 MG tablet Take 7 mg by mouth daily with breakfast. 07/16/13   Minerva Ends, MD  PROAIR HFA 108 (90 BASE) MCG/ACT inhaler inhale 1 to 2 puffs by mouth every 4 hours if needed for shortness of breath 01/29/13   Andrena Mews, MD  Pseudoephedrine-Guaifenesin (GUAIFEN-PSE PO) Take 600 mg by mouth as needed.    Historical Provider, MD  SYNTHROID 50 MCG tablet take 1 tablet by mouth once daily 07/17/13   Josalyn C Funches,  MD  tiotropium (SPIRIVA) 18 MCG inhalation capsule Place 18 mcg into inhaler and inhale daily.    Historical Provider, MD  traMADol (ULTRAM) 50 MG tablet take 1 tablet by mouth twice a day if needed SEVERE PAIN 05/02/13   Andrena Mews, MD  Vitamin D, Ergocalciferol, (DRISDOL) 50000 UNITS CAPS capsule Take 1 capsule (50,000 Units total) by mouth every 7 (seven) days. 08/27/13   Andrena Mews, MD   BP 135/61  Pulse 72  Temp(Src) 98.6 F (37 C)  Resp 16  Ht 5' 3"  (1.6 m)  Wt 170 lb (77.111 kg)  BMI 30.12 kg/m2  SpO2 95% Physical Exam  Nursing note and vitals reviewed. Constitutional: She is oriented to person, place, and time. She appears well-developed and well-nourished. No distress.  Elderly  HENT:  Head: Normocephalic and atraumatic.  Mouth/Throat: Oropharynx is clear and moist.  Eyes: Pupils are equal, round, and reactive to light.  Neck: Neck supple.  Cardiovascular: Normal rate, regular rhythm and normal heart sounds.   No murmur heard. Pulmonary/Chest: Effort normal and breath sounds normal. No respiratory distress. She has no wheezes.  Abdominal: Soft. Bowel sounds are normal. There is no tenderness.  Musculoskeletal:  Trace bilateral lower extremity edema  Neurological: She is alert and oriented to person, place, and time.  Cranial nerves II through XII intact, no facial droop noted, 5 out of 5 strength in bilateral upper and lower extremities  Skin: Skin is warm and dry.  Psychiatric:  She has a normal mood and affect.    ED Course  Procedures (including critical care time) Labs Review Labs Reviewed  CBC - Abnormal; Notable for the following:    WBC 16.0 (*)    RBC 3.31 (*)    Hemoglobin 10.3 (*)    HCT 30.6 (*)    All other components within normal limits  DIFFERENTIAL - Abnormal; Notable for the following:    Neutro Abs 12.3 (*)    All other components within normal limits  COMPREHENSIVE METABOLIC PANEL - Abnormal; Notable for the following:    Sodium 131 (*)    Chloride 93 (*)    Glucose, Bld 131 (*)    BUN 37 (*)    Creatinine, Ser 1.80 (*)    Albumin 3.2 (*)    GFR calc non Af Amer 27 (*)    GFR calc Af Amer 31 (*)    All other components within normal limits  CBG MONITORING, ED - Abnormal; Notable for the following:    Glucose-Capillary 132 (*)    All other components within normal limits  CBG MONITORING, ED - Abnormal; Notable for the following:    Glucose-Capillary 120 (*)    All other components within normal limits  PROTIME-INR  APTT  I-STAT TROPOININ, ED    Imaging Review Ct Head (brain) Wo Contrast  10/16/2013   CLINICAL DATA:  Code stroke.  Facial droop.  Left hand weakness.  EXAM: CT HEAD WITHOUT CONTRAST  TECHNIQUE: Contiguous axial images were obtained from the base of the skull through the vertex without contrast.  COMPARISON:  02/25/2012.  FINDINGS: No evidence for acute infarction, hemorrhage, mass lesion, hydrocephalus, or extra-axial fluid. Mild atrophy. Mild chronic microvascular ischemic change. Remote right cerebellar infarct. No large vessel infarct of the cerebral hemispheres. Negative orbits, sinuses, and mastoids. Similar appearance to priors.  IMPRESSION: No acute intracranial abnormality.  Chronic changes as described.  Critical Value/emergent results were called by telephone at the time of interpretation on 10/16/2013 at 4:15 PM  to Dr. Wallie Char, who verbally acknowledged these results.   Electronically Signed   By: Rolla Flatten M.D.   On: 10/16/2013 16:17     EKG Interpretation   Date/Time:  Tuesday Oct 16 2013 16:10:05 EDT Ventricular Rate:  71 PR Interval:  159 QRS Duration: 94 QT Interval:  370 QTC Calculation: 402 R Axis:   70 Text Interpretation:  Sinus rhythm Confirmed by Eduarda Scrivens  MD, Loma Sousa  ([id number]) on 10/16/2013 4:13:08 PM      MDM   Final diagnoses:  TIA (transient ischemic attack)  Diabetes mellitus, type 2    Patient presents as a code stroke. Symptoms have largely resolved at this time. History of stroke previously. Patient reports generalized weakness.  No obvious focal deficits on my evaluation. Patient evaluated by Dr. Nicole Kindred who feels patient should be admitted for TIA workup.  Patient is a primary patient of the [employer]. Will call for admission.    Merryl Hacker, MD 10/16/13 1728

## 2013-10-16 NOTE — Telephone Encounter (Signed)
Pt arrived at [facility] ER at 3:30pm.

## 2013-10-16 NOTE — ED Notes (Signed)
Patient transported to MRI 

## 2013-10-16 NOTE — Consult Note (Signed)
Referring Physician: Dr. Dina Rich    Chief Complaint: Right facial droop and speech difficulty  HPI: Rebecca [name] is an 73 y.o. female history of diabetes mellitus, renal insufficiency, previous stroke, coronary artery disease and polymyalgia rheumatica, presenting with new onset weakness involving right side of her face with drooling from the left side of her mouth as well as speech difficulty. She was last known well at 2:15 today. Symptoms resolved fairly rapidly after arriving in the emergency room. She's been taking aspirin 160 mg per day. CT scan of her head showed no acute intracranial abnormality. NIH stroke score was 1.  LSN: 2:15 PM on 10/16/2013 tPA Given: No: Rapidly resolving deficits MRankin: 1  Past Medical History  Diagnosis Date  . Diabetes mellitus   . Frozen shoulder     right  . Obesity   . Renal insufficiency   . COPD (chronic obstructive pulmonary disease)   . Shortness of breath   . Stroke   . CAD (coronary artery disease) 2006    Taxus stent of mid RCA. negative myoview 2014 and normal LV function  . Diastolic heart failure, NYHA class 1   . Polymyalgia rheumatica     Family History  Problem Relation Age of Onset  . Diabetes Mother   . Heart disease Mother   . Heart disease Father   . Arthritis Father   . Healthy Sister   . Heart disease Brother   . Osteoporosis Sister   . Heart disease Brother   . Diabetes Brother   . Heart disease Brother   . Diabetes Brother   . Diabetes Brother   . Healthy Brother   . Healthy Brother   . Healthy Brother      Medications: I have reviewed the patient's current medications.  ROS: History obtained from child and the patient  General ROS: negative for - chills, fatigue, fever, night sweats, weight gain or weight loss Psychological ROS: negative for - behavioral disorder, hallucinations, memory difficulties, mood swings or suicidal ideation Ophthalmic ROS: negative for - blurry vision, double vision, eye pain  or loss of vision ENT ROS: negative for - epistaxis, nasal discharge, oral lesions, sore throat, tinnitus or vertigo Allergy and Immunology ROS: negative for - hives or itchy/watery eyes Hematological and Lymphatic ROS: negative for - bleeding problems, bruising or swollen lymph nodes Endocrine ROS: negative for - galactorrhea, hair pattern changes, polydipsia/polyuria or temperature intolerance Respiratory ROS: negative for - cough, hemoptysis, shortness of breath or wheezing Cardiovascular ROS: negative for - chest pain, dyspnea on exertion, edema or irregular heartbeat Gastrointestinal ROS: negative for - abdominal pain, diarrhea, hematemesis, nausea/vomiting or stool incontinence Genito-Urinary ROS: negative for - dysuria, hematuria, incontinence or urinary frequency/urgency Musculoskeletal ROS: negative for - joint swelling or muscular weakness Neurological ROS: as noted in HPI Dermatological ROS: negative for rash and skin lesion changes  Physical Examination: Blood pressure 98/48, pulse 72, temperature 98.6 F (37 C), resp. rate 20, height 5\' 3"  (1.6 m), weight 77.111 kg (170 lb), SpO2 95.00%.  Neurologic Examination: Mental Status: Alert,  Slightly disoriented to be correct month.  Speech fluent without evidence of aphasia. Able to follow commands without difficulty. Cranial Nerves: II-Visual fields were normal. III/IV/VI-Pupils were equal and reacted. Extraocular movements were full and conjugate.    V/VII-no facial numbness and no facial weakness. VIII-normal. X-normal speech and symmetrical palatal movement. Motor: 5/5 bilaterally with normal tone and bulk Sensory: Normal throughout. Deep Tendon Reflexes: Trace to 1+ and symmetric. Plantars: Mute bilaterally  Cerebellar: Normal finger-to-nose testing.  Ct Head (brain) Wo Contrast  10/16/2013   CLINICAL DATA:  Code stroke.  Facial droop.  Left hand weakness.  EXAM: CT HEAD WITHOUT CONTRAST  TECHNIQUE: Contiguous axial images  were obtained from the base of the skull through the vertex without contrast.  COMPARISON:  02/25/2012.  FINDINGS: No evidence for acute infarction, hemorrhage, mass lesion, hydrocephalus, or extra-axial fluid. Mild atrophy. Mild chronic microvascular ischemic change. Remote right cerebellar infarct. No large vessel infarct of the cerebral hemispheres. Negative orbits, sinuses, and mastoids. Similar appearance to priors.  IMPRESSION: No acute intracranial abnormality.  Chronic changes as described.  Critical Value/emergent results were called by telephone at the time of interpretation on 10/16/2013 at 4:15 PM to Dr. Wallie Char, who verbally acknowledged these results.   Electronically Signed   By: Rolla Flatten M.D.   On: 10/16/2013 16:17    Assessment: 73 y.o. female  presenting with probable left subcortical MCA territory TIA. Small vessel infarction cannot be ruled out at this point, however.   Stroke Risk Factors - diabetes mellitus  Plan: 1. HgbA1c, fasting lipid panel 2. MRI, MRA  of the brain without contrast 3. PT consult, OT consult, Speech consult 4. Echocardiogram 5. Carotid dopplers 6. Prophylactic therapy-Antiplatelet med: Aspirin and a 7. Risk factor modification 8. Telemetry monitoring   C.R. Nicole Kindred, MD [facility] [phone]  10/16/2013, 4:27 PM

## 2013-10-16 NOTE — Progress Notes (Signed)

## 2013-10-17 ENCOUNTER — Encounter (HOSPITAL_COMMUNITY): Payer: Self-pay | Admitting: *Deleted

## 2013-10-17 DIAGNOSIS — G459 Transient cerebral ischemic attack, unspecified: Secondary | ICD-10-CM | POA: Diagnosis not present

## 2013-10-17 DIAGNOSIS — I517 Cardiomegaly: Secondary | ICD-10-CM

## 2013-10-17 DIAGNOSIS — I6350 Cerebral infarction due to unspecified occlusion or stenosis of unspecified cerebral artery: Secondary | ICD-10-CM

## 2013-10-17 LAB — LIPID PANEL
Cholesterol: 122 mg/dL (ref 0–200)
HDL: 65 mg/dL (ref >39)
LDL CALC: 41 mg/dL (ref 0–99)
Total CHOL/HDL Ratio: 1.9 RATIO
Triglycerides: 80 mg/dL (ref <150)
VLDL: 16 mg/dL (ref 0–40)

## 2013-10-17 LAB — GLUCOSE, CAPILLARY
GLUCOSE-CAPILLARY: 160 mg/dL — AB (ref 70–99)
Glucose-Capillary: 108 mg/dL — ABNORMAL HIGH (ref 70–99)
Glucose-Capillary: 132 mg/dL — ABNORMAL HIGH (ref 70–99)
Glucose-Capillary: 97 mg/dL (ref 70–99)

## 2013-10-17 LAB — HEMOGLOBIN A1C
Hgb A1c MFr Bld: 5.7 % — ABNORMAL HIGH (ref <5.7)
Mean Plasma Glucose: 117 mg/dL — ABNORMAL HIGH (ref <117)

## 2013-10-17 MED ORDER — CLOPIDOGREL BISULFATE 75 MG PO TABS
75.0000 mg | ORAL_TABLET | Freq: Every day | ORAL | Status: DC
  Administered 2013-10-17 – 2013-10-18 (×2): 75 mg via ORAL
  Filled 2013-10-17 (×2): qty 1

## 2013-10-17 NOTE — Evaluation (Signed)
Physical Therapy Evaluation Patient Details Name: Rebecca [name] MRN: [id number] DOB: 1940/12/01 Today's Date: 10/17/2013   History of Present Illness  73 y.o. female admitted to [facility] on 10/16/13 with left sided facial droop and weakness.  MRI revealed remote cerebellar infarct.    Clinical Impression  Pt moving well, mildly unsteady on her feet.  She required less assistance using the RW, but is impulsive with her use of it, picking it up around obstacles and moving quickly around the room and in the hallway.   PT to follow acutely for deficits listed below.      Follow Up Recommendations Home health PT;Supervision/Assistance - 24 hour    Equipment Recommendations  None recommended by PT    Recommendations for Other Services   NA    Precautions / Restrictions Precautions Precautions: Fall Precaution Comments: pt is impulsive and unsteady      Mobility  Bed Mobility Overal bed mobility: Modified Independent             General bed mobility comments: pt using bed rail for leverage  Transfers Overall transfer level: Needs assistance Equipment used: None Transfers: Sit to/from Stand Sit to Stand: Min guard         General transfer comment: min guard assist for balance and safety, depite cues to wait and slow down, pt on her feet before therapist was ready.   Ambulation/Gait Ambulation/Gait assistance: Min assist;Min guard Ambulation Distance (Feet): 85 Feet Assistive device: Rolling walker (2 wheeled);None Gait Pattern/deviations: Step-through pattern;Staggering right;Staggering left     General Gait Details: pt wtih staggering gait pattern, better with RW, but still impulsive.       Balance Overall balance assessment: Needs assistance Sitting-balance support: No upper extremity supported;Feet supported Sitting balance-Leahy Scale: Good     Standing balance support: No upper extremity supported Standing balance-Leahy Scale: Fair                               Pertinent Vitals/Pain Pt reports HA, called RN via call bell for pain meds.      Home Living Family/patient expects to be discharged to:: Private residence Living Arrangements: Children Available Help at Discharge: Family;Available PRN/intermittently (per SLP note pt is rarely left alone) Type of Home: House Home Access: Stairs to enter Entrance Stairs-Rails: Right Entrance Stairs-Number of Steps: 3-4 Home Layout: One level Home Equipment: Walker - 2 wheels;Shower seat;Grab bars - tub/shower      Prior Function Level of Independence: Independent with assistive device(s)         Comments: used walker outdoors     Hand Dominance   Dominant Hand: Right    Extremity/Trunk Assessment   Upper Extremity Assessment: Defer to OT evaluation           Lower Extremity Assessment: Overall WFL for tasks assessed (no obvious asymmetries noted)      Cervical / Trunk Assessment: Normal  Communication   Communication: No difficulties  Cognition Arousal/Alertness: Awake/alert Behavior During Therapy: WFL for tasks assessed/performed Overall Cognitive Status: No family/caregiver present to determine baseline cognitive functioning (h/o cognitive impairments)                               Assessment/Plan    PT Assessment Patient needs continued PT services  PT Diagnosis Difficulty walking;Abnormality of gait;Acute pain;Altered mental status   PT Problem List Decreased activity tolerance;Decreased balance;Decreased mobility;Decreased  cognition;Decreased knowledge of use of DME;Decreased safety awareness;Decreased knowledge of precautions;Pain  PT Treatment Interventions DME instruction;Gait training;Stair training;Functional mobility training;Therapeutic activities;Therapeutic exercise;Balance training;Neuromuscular re-education;Cognitive remediation;Patient/family education;Modalities   PT Goals (Current goals can be found in the Care Plan section) Acute  Rehab PT Goals Patient Stated Goal: none stated PT Goal Formulation: Patient unable to participate in goal setting Time For Goal Achievement: 10/31/13 Potential to Achieve Goals: Good    Frequency Min 4X/week        End of Session   Activity Tolerance: Patient limited by pain Patient left: in bed;with call bell/phone within reach;with bed alarm set      Functional Assessment Tool Used: assist level Functional Limitation: Mobility: Walking and moving around Mobility: Walking and Moving Around Current Status (Z6109): At least 1 percent but less than 20 percent impaired, limited or restricted Mobility: Walking and Moving Around Goal Status [id number]): 0 percent impaired, limited or restricted    Time: 1527-1539 PT Time Calculation (min): 12 min   Charges:   PT Evaluation $Initial PT Evaluation Tier I: 1 Procedure PT Treatments $Gait Training: 8-22 mins   PT G Codes:   Functional Assessment Tool Used: assist level Functional Limitation: Mobility: Walking and moving around    Hoffman B. [place]ow, [place], DPT [phone]   10/17/2013, 4:34 PM

## 2013-10-17 NOTE — Evaluation (Signed)
Speech Language Pathology Evaluation Patient Details Name: Rebecca [name] MRN: [id number] DOB: 02/21/1941 Today's Date: 10/17/2013 Time: 1430-1500 SLP Time Calculation (min): 30 min  Problem List:  Patient Active Problem List   Diagnosis Date Noted  . TIA (transient ischemic attack) 10/16/2013  . COPD exacerbation 07/16/2013  . Snoring 06/29/2013  . Diabetes mellitus, type 2 06/29/2013  . Chest pain at rest 03/14/2013  . Estrogen deficiency 02/06/2013  . Bruising 01/02/2013  . Osteoporosis screening 01/02/2013  . Breast cancer screening 01/02/2013  . Personal history of fall 10/19/2012  . Polymyalgia rheumatica 10/03/2012  . Weakness 09/27/2012  . UTI (urinary tract infection) 09/19/2012  . Trochanteric bursitis of right hip 04/11/2012  . At high risk for falls 02/28/2012  . Lower urinary tract symptoms 01/27/2012  . Chronic constipation 01/26/2012  . Memory impairment 12/01/2010  . History of small bowel obstruction 11/04/2010  . COPD, MILD 07/09/2010  . 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 CONSTIPATION, CHRONIC 12/12/2008  . PRURITUS 03/06/2008  . SCHIZOAFFECTIVE DISORDER 01/10/2008  . GOUT 09/11/2007  . LOW BACK PAIN, CHRONIC 07/05/2007  . HYPERCHOLESTEROLEMIA 01/25/2007  . HYPOTHYROIDISM, UNSPECIFIED 08/11/2006  . OBESITY, NOS 08/11/2006  . ANEMIA, NORMOCYTIC, CHRONIC 08/11/2006  . HYPERTENSION, BENIGN SYSTEMIC 08/11/2006  . CAD 08/11/2006  . HEMORRHOIDS, NOS 08/11/2006  . GASTROESOPHAGEAL REFLUX, NO ESOPHAGITIS 08/11/2006  . RENAL INSUFFICIENCY, CHRONIC 08/11/2006  . ARTHRITIS 08/11/2006   Past Medical History:  Past Medical History  Diagnosis Date  . Diabetes mellitus   . Frozen shoulder     right  . Obesity   . Renal insufficiency   . COPD (chronic obstructive pulmonary disease)   . Shortness of breath   . Stroke   . CAD (coronary artery disease) 2006    Taxus stent of mid RCA. negative myoview 2014 and normal LV function  . Diastolic heart  failure, NYHA class 1   . Polymyalgia rheumatica    Past Surgical History:  Past Surgical History  Procedure Laterality Date  . Partial hysterectomy  1995  . Abdominal hysterectomy    . Cholecystectomy    . Coronary angioplasty with stent placement  2006    Taxus DES to RCA, 50 % residual   HPI:  73 year old female admitted 10/16/13 with left facial weakness. PMH significant for TIA, COPD, DM. CT and MRI negative. SLE ordered to evaluate comm/cog status.  Pt passed RN stroke swallow screen   Assessment / Plan / Recommendation Clinical Impression  Mini-mental state examination was administered. Pt scored 26/30.  Deficits noted in delayed recall, attention, and complex direction following. She reports a history of decline in recall, but indicated current level of function is at baseline.  Pt encouraged to notify MD if deficits progress.  Recommend consideration of outpatient or home health services if cognitive status continues to decline. Pt currently lives at home with 1 of her 2 daughters, and is rarely left unattended more than an hour or so (per pt report)    SLP Assessment  Patient does not need any further Speech Lanaguage Pathology Services    Follow Up Recommendations       Frequency and Duration        Pertinent Vitals/Pain VSS, 10/10 abdominal pain. RN notified   SLP Goals   n/a  SLP Evaluation Prior Functioning  Cognitive/Linguistic Baseline: Baseline deficits Baseline deficit details: pt reports history of memory deficits. Type of Home: House  Lives With: Daughter Available Help at Discharge: Family;Available 24 hours/day Education:  9th grade Vocation: Unemployed   Cognition  Overall Cognitive Status:  (memory decline. No new deficits reported) Arousal/Alertness: Awake/alert Orientation Level: Oriented X4 Attention: Focused;Sustained Focused Attention: Appears intact Sustained Attention: Impaired Sustained Attention Impairment: Verbal basic (difficulty  spelling WORLD backwards (DLROD)) Memory: Impaired (delayed recall of 1/3 unrelated words) Awareness: Appears intact (aware of decline in memory) Problem Solving:  (not assesed) Executive Function:  (not assessed) Safety/Judgment:  (not assessed) Comments: Mini-Mental State Examination administered. Pt scored 26/30.    Comprehension  Auditory Comprehension Overall Auditory Comprehension: Appears within functional limits for tasks assessed Visual Recognition/Discrimination Discrimination: Not tested Reading Comprehension Reading Status: Not tested    Expression Expression Primary Mode of Expression: Verbal Verbal Expression Overall Verbal Expression: Appears within functional limits for tasks assessed Written Expression Dominant Hand: Right Written Expression: Not tested   Oral / Motor Oral Motor/Sensory Function Overall Oral Motor/Sensory Function: Appears within functional limits for tasks assessed Motor Speech Overall Motor Speech: Appears within functional limits for tasks assessed   GO Functional Assessment Tool Used: Mini-mental State Examination, ASHA NOMS, clinical judgment Functional Limitations: Memory Memory Current Status (S9373): At least 1 percent but less than 20 percent impaired, limited or restricted Memory Goal Status (S2876): At least 1 percent but less than 20 percent impaired, limited or restricted Memory Discharge Status [phone]): At least 1 percent but less than 20 percent impaired, limited or restricted   Tristine Langi B. Quentin Ore [facility], CCC-SLP [phone] [place] 10/17/2013, 3:04 PM

## 2013-10-17 NOTE — Evaluation (Addendum)
Occupational Therapy Evaluation Patient Details Name: Rebecca [name] MRN: [id number] DOB: 10-31-1940 Today's Date: 10/17/2013    History of Present Illness 73 y.o. admitted with left sided facial droop and weakness. Pt with remote cerebellar infarct.   Clinical Impression   Pt presents with below problem list. Pt independent with ADLs, PTA. Feel pt will benefit from acute OT to increase independence prior to d/c.     Follow Up Recommendations  Home health OT;Supervision/Assistance - 24 hour    Equipment Recommendations  3 in 1 bedside comode    Recommendations for Other Services       Precautions / Restrictions Restrictions Weight Bearing Restrictions: No      Mobility Bed Mobility                  Transfers Overall transfer level: Needs assistance   Transfers: Sit to/from Stand Sit to Stand: Supervision;Min guard         General transfer comment: cues for technique for toilet transfer.    Balance                                            ADL Overall ADL's : Needs assistance/impaired     Grooming: Oral care;Set up;Supervision/safety;Standing               Lower Body Dressing: Set up;Supervision/safety;Sit to/from stand   Toilet Transfer: Ambulation;Min guard;RW;Supervision/safety (Min guard without walker-hand held assist)   Toileting- Water quality scientist and Hygiene: Supervision/safety (standing)       Functional mobility during ADLs: Min guard;Rolling walker;Supervision/safety (Min guard without walker with hand held assist) General ADL Comments: Educated on signs/symptoms of stroke and to get help right away. Wrote down BE FAST information on sheet for pt. Discussed avoiding canned foods. Educated on safe shoe wear and to recommended daughter pick up rugs in house. Recommended sitting for bathing and dressing. Ambulated with and without walker. Running into things on right side.     Vision                      Perception     Praxis      Pertinent Vitals/Pain Pain in hands and buttocks. Nurse notified.      Hand Dominance     Extremity/Trunk Assessment Upper Extremity Assessment Upper Extremity Assessment: RUE deficits/detail RUE Deficits / Details: limited ROM in right shoulder; per chart. frozen shoulder   Lower Extremity Assessment Lower Extremity Assessment: Defer to PT evaluation       Communication Communication Communication: No difficulties (did have some drooling)   Cognition Arousal/Alertness: Awake/alert;Lethargic (lethargic at times) Behavior During Therapy: WFL for tasks assessed/performed Overall Cognitive Status: Within Functional Limits for tasks assessed                     General Comments       Exercises       Shoulder Instructions      Home Living Family/patient expects to be discharged to:: Private residence Living Arrangements: Children Available Help at Discharge: Family;Available PRN/intermittently Type of Home: House Home Access: Stairs to enter [employer] of Steps: 3-4 Entrance Stairs-Rails: Right Home Layout: One level     Bathroom Shower/Tub: Occupational psychologist: Standard     Home Equipment: Other (comment);Shower seat;Grab bars - tub/shower (has walker at home)  Prior Functioning/Environment Level of Independence: Independent with assistive device(s)        Comments: used walker outdoors    OT Diagnosis: Generalized weakness;Acute pain   OT Problem List: Impaired balance (sitting and/or standing);Pain;Impaired sensation;Decreased strength;Decreased activity tolerance;Impaired vision/perception;Decreased knowledge of use of DME or AE;Decreased knowledge of precautions   OT Treatment/Interventions: Self-care/ADL training;DME and/or AE instruction;Therapeutic activities;Patient/family education;Balance training;Visual/perceptual remediation/compensation    OT Goals(Current goals can be  found in the care plan section) Acute Rehab OT Goals Patient Stated Goal: not stated OT Goal Formulation: With patient Time For Goal Achievement: 10/24/13 Potential to Achieve Goals: Good ADL Goals Pt Will Perform Lower Body Dressing: with modified independence;sit to/from stand (including gathering supplies) Pt Will Transfer to Toilet: with modified independence;ambulating (3 in 1 over commode) Pt Will Perform Toileting - Clothing Manipulation and hygiene: with modified independence;sit to/from stand  OT Frequency: Min 2X/week   Barriers to D/C: Decreased caregiver support          Co-evaluation              End of Session Equipment Utilized During Treatment: Gait belt;Rolling walker Nurse Communication: Mobility status;Other (comment) (pain in hands/buttocks)  Activity Tolerance: Patient tolerated treatment well Patient left: Other (comment) (on toilet with nurse tech)   Time: [phone] OT Time Calculation (min): 24 min Charges:  OT General Charges $OT Visit: 1 Procedure OT Evaluation $Initial OT Evaluation Tier I: 1 Procedure OT Treatments $Self Care/Home Management : 8-22 mins G-Codes:    Benito Mccreedy OTR/L [phone] 10/17/2013, 10:16 AM

## 2013-10-17 NOTE — Progress Notes (Signed)
Utilization Review Completed.Rebecca [name] T Dowell5/11/2013  

## 2013-10-17 NOTE — Progress Notes (Signed)
  Echocardiogram 2D Echocardiogram has been performed.  Valinda Hoar 10/17/2013, 10:39 AM

## 2013-10-17 NOTE — Progress Notes (Signed)
Stroke Team Progress Note  HISTORY Rebecca [name] is an 73 y.o. female history of diabetes mellitus, renal insufficiency, previous stroke, coronary artery disease and polymyalgia rheumatica, presenting with new onset weakness involving right side of her face with drooling from the left side of her mouth as well as speech difficulty. She was last known well at 2:15 today 10/16/2013. Symptoms resolved fairly rapidly after arriving in the emergency room. She's been taking aspirin 160 mg per day. CT scan of her head showed no acute intracranial abnormality. NIH stroke score was 1.  Patient was not administered TPA secondary to Rapidly resolving deficits. She was admitted for further evaluation and treatment.  SUBJECTIVE Patient is currently in the vascular lab.  Overall she feels her condition is stable.   OBJECTIVE Most recent Vital Signs: Filed Vitals:   10/17/13 0357 10/17/13 0359 10/17/13 0616 10/17/13 0738  BP:  152/61 134/42 137/57  Pulse:  77 78 80  Temp:  98.2 F (36.8 C) 98.4 F (36.9 C) 98.6 F (37 C)  TempSrc:  Axillary Oral Oral  Resp:  16 16 20   Height:      Weight:      SpO2: 98% 100% 96% 96%   CBG (last 3)   Recent Labs  10/16/13 1611 10/16/13 2157 10/17/13 0650  GLUCAP 120* 161* 108*    IV Fluid Intake:     MEDICATIONS  . aspirin  300 mg Rectal Daily   Or  . aspirin  325 mg Oral Daily  . clozapine  300 mg Oral QHS  . diltiazem  180 mg Oral Daily  . enoxaparin (LOVENOX) injection  30 mg Subcutaneous Q24H  . ferrous sulfate  325 mg Oral BID WC  . insulin aspart  0-9 Units Subcutaneous TID WC  . isosorbide mononitrate  60 mg Oral Daily  . levothyroxine  50 mcg Oral QAC breakfast  . loratadine  10 mg Oral Daily  . metoprolol succinate  100 mg Oral Daily  . mometasone-formoterol  2 puff Inhalation BID  . multivitamin with minerals  1 tablet Oral QHS  . omega-3 acid ethyl esters  1 g Oral Daily  . pantoprazole  40 mg Oral Daily  . predniSONE  6 mg Oral Q  breakfast  . simvastatin  10 mg Oral q1800  . temazepam  15 mg Oral QHS  . tiotropium  18 mcg Inhalation Daily  . [START ON 10/20/2013] Vitamin D (Ergocalciferol)  50,000 Units Oral Q7 days   PRN:  acetaminophen, albuterol, nitroGLYCERIN, polyethylene glycol, polyvinyl alcohol, senna-docusate, traMADol  Diet:  Carb Control thin liquids Activity:  DVT Prophylaxis:  Lovenox 30 mg sq daily   CLINICALLY SIGNIFICANT STUDIES Basic Metabolic Panel:   Recent Labs Lab 10/16/13 1553 10/16/13 2027  NA 131*  --   K 4.5  --   CL 93*  --   CO2 24  --   GLUCOSE 131*  --   BUN 37*  --   CREATININE 1.80* 1.68*  CALCIUM 10.0  --    Liver Function Tests:   Recent Labs Lab 10/16/13 1553  AST 15  ALT 19  ALKPHOS 39  BILITOT 0.3  PROT 6.5  ALBUMIN 3.2*   CBC:   Recent Labs Lab 10/16/13 1553 10/16/13 2027  WBC 16.0* 14.7*  NEUTROABS 12.3*  --   HGB 10.3* 9.9*  HCT 30.6* 29.0*  MCV 92.4 91.8  PLT 221 185   Coagulation:   Recent Labs Lab 10/16/13 1553  LABPROT 12.8  INR  0.98   Cardiac Enzymes: No results found for this basename: CKTOTAL, [name], [name], TROPONINI,  in the last 168 hours Urinalysis: No results found for this basename: COLORURINE, APPERANCEUR, LABSPEC, PHURINE, GLUCOSEU, HGBUR, BILIRUBINUR, KETONESUR, PROTEINUR, UROBILINOGEN, NITRITE, LEUKOCYTESUR,  in the last 168 hours Lipid Panel    Component Value Date/Time   CHOL 122 10/17/2013 0513   [name] 80 10/17/2013 0513   [name] 65 10/17/2013 0513   [name] 1.9 10/17/2013 0513   VLDL 16 10/17/2013 0513   [name] 41 10/17/2013 0513   HgbA1C  Lab Results  Component Value Date   HGBA1C 5.8 06/28/2013    Urine Drug Screen:   No results found for this basename: labopia,  cocainscrnur,  labbenz,  amphetmu,  thcu,  labbarb    Alcohol Level: No results found for this basename: ETH,  in the last 168 hours    CT of the brain  10/16/2013    No acute intracranial abnormality.  Chronic changes as described.    MRI of the brain   10/16/2013    1. No evidence of acute intracranial abnormality. 2. Mild to moderate chronic small vessel ischemic disease and cerebral atrophy.   MRA of the brain  10/16/2013   . No evidence of major intracranial arterial occlusion or significant stenosis.     2D Echocardiogram  EF 60-65% with no source of embolus.   Carotid Doppler    CXR  10/16/2013   No acute infiltrate or pulmonary edema. Left basilar atelectasis or scarring.     EKG  normal sinus rhythm. For complete results please see formal report.   Therapy Recommendations HH OT  Physical Exam   Neurologic Examination:  Mental Status:  Alert, Slightly disoriented to be correct month. Speech fluent without evidence of aphasia. Able to follow commands without difficulty.  Cranial Nerves:  II-Visual fields were normal.  III/IV/VI-Pupils were equal and reacted. Extraocular movements were full and conjugate.  V/VII-no facial numbness and no facial weakness.  VIII-normal.  X-normal speech and symmetrical palatal movement.  Motor: 5/5 bilaterally with normal tone and bulk  Sensory: Normal throughout.  Deep Tendon Reflexes: Trace to 1+ and symmetric.  Plantars: Mute bilaterally  Cerebellar: Normal finger-to-nose testing.   ASSESSMENT Ms. Rebecca [name] is a 73 y.o. female presenting with Right facial droop and speech difficulty.Imaging confirms no acute infarct. Dx:  Left brain TIA. 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  On aspirin 81 mg orally every day prior to admission. Now on aspirin 325 mg orally every day for secondary stroke prevention. Stroke work up underway.  Diabetes, HgbA1c 5.8 in Jan, goal < 7.0  Hyperlipidemia, LDL 41, on pravachol 20 PTA, now on zocor 10, at goal LDL < 70 for diabetics  Obesity, Body mass index is 30.12 kg/(m^2).   RI, Cr  1.86  Previous stroke  CAD   [facility] day # 1  TREATMENT/PLAN  Change aspirin to clopidogrel 75 mg orally every day for secondary stroke  prevention.  Follow up carotid dopplers  HH OT. F/u PT recommendations  Ok for discharge once stroke workup completed from our standpoint  Follow up Stroke clinic in 2 months  Burnetta Sabin, MSN, RN, ANVP-BC, AGPCNP-BC [facility] Pager: [phone] 10/17/2013 11:25 AM  I have personally obtained a history, examined the patient, evaluated imaging results, and formulated the assessment and plan of care. I agree with the above.  Jim Like, DO Neurology-Stroke [facility] Pager: [phone]  To contact Stroke Continuity provider, please refer to [email or web address] After hours, contact General Neurology

## 2013-10-17 NOTE — Progress Notes (Signed)
SLP Cancellation Note  Patient Details Name: Rebecca [name] MRN: [id number] DOB: December 07, 1940   Cancelled treatment:        Unable to complete SLE at this time, as pt is off unit for testing/procedures.  Will continue efforts.  Celia B. Quentin Ore [facility], CCC-SLP [phone]  Lorre Nick 10/17/2013, 10:30 AM

## 2013-10-17 NOTE — Progress Notes (Signed)
PT Cancellation Note  Patient Details Name: Rebecca [name] MRN: [id number] DOB: 1940/08/26   Cancelled Treatment:    Reason Eval/Treat Not Completed: Patient at procedure or test/unavailable.  PT to check back later as time allows.   Thanks,    Barbarann Ehlers. [place], [place], DPT [phone]   10/17/2013, 10:57 AM

## 2013-10-17 NOTE — Progress Notes (Signed)
*  PRELIMINARY RESULTS* Vascular Ultrasound Carotid Duplex (Doppler) has been completed.   Findings suggest 1-39% internal carotid artery stenosis bilaterally. The right vertebral artery is patent with antegrade flow. The left vertebral artery is patent with antegrade flow, however the waveform is atypical. This along with the 20mmHg gradient between brachial arteries is suggestive of possible developing proximal obstruction.  10/17/2013 12:05 PM Maudry Mayhew, RVT, RDCS, RDMS

## 2013-10-18 ENCOUNTER — Telehealth: Payer: Self-pay | Admitting: *Deleted

## 2013-10-18 DIAGNOSIS — E119 Type 2 diabetes mellitus without complications: Secondary | ICD-10-CM

## 2013-10-18 DIAGNOSIS — G459 Transient cerebral ischemic attack, unspecified: Principal | ICD-10-CM

## 2013-10-18 LAB — GLUCOSE, CAPILLARY
GLUCOSE-CAPILLARY: 112 mg/dL — AB (ref 70–99)
GLUCOSE-CAPILLARY: 129 mg/dL — AB (ref 70–99)
GLUCOSE-CAPILLARY: 142 mg/dL — AB (ref 70–99)

## 2013-10-18 MED ORDER — CLOPIDOGREL BISULFATE 75 MG PO TABS: 75.0000 mg | ORAL_TABLET | Freq: Every day | ORAL | Status: AC

## 2013-10-18 NOTE — Telephone Encounter (Signed)
Pt's daughter was calling in regards to her mother being in the hospital. She wants to speak to Dr. Lysbeth Penner nurse or Dr. Debara Pickett.

## 2013-10-18 NOTE — Progress Notes (Signed)
Physical Therapy Treatment Patient Details Name: Rebecca [name] MRN: [id number] DOB: 1940-11-05 Today's Date: 10/18/2013    History of Present Illness 73 y.o. female admitted to [facility] on 10/16/13 with left sided facial droop and weakness.  MRI revealed remote cerebellar infarct.      PT Comments     Pt is progressing well with therapy, but continues to be more steady if she uses the RW.  She needs quite a bit of safety cues to use the RW correctly.  She is a moderate fall risk and I continue to recommend home therapy f/u at d/c.    Follow Up Recommendations  Home health PT;Supervision/Assistance - 24 hour     Equipment Recommendations  None recommended by PT    Recommendations for Other Services   NA     Precautions / Restrictions Precautions Precautions: Fall Precaution Comments: pt is impulsive and unsteady    Mobility  Bed Mobility Overal bed mobility: Modified Independent             General bed mobility comments: pt quickly and easily sat up EOB using railing for leverage  Transfers Overall transfer level: Modified independent Equipment used: None Transfers: Sit to/from Stand Sit to Stand: Modified independent (Device/Increase time)         General transfer comment: pt using bed rail and bars in the bathroom to steady herself during transitions.  She is quick to move, but generally safe if hands are used.    Ambulation/Gait Ambulation/Gait assistance: Supervision Ambulation Distance (Feet): 200 Feet Assistive device: Rolling walker (2 wheeled);None Gait Pattern/deviations: Step-through pattern;Staggering left;Staggering right   Gait velocity interpretation: at or above normal speed for age/gender General Gait Details: pt staggering and continues to pick the entire RW up to turn instead of maintaining contact with the ground.  Pt was quick to move to go the short distance without the RW to the bathroom, but she is certainly more unsteady without it.  She parks the  walker and walks to her sitting surface requiring verbal cues for safety and reinforced frequently throughout the session.    Stairs Stairs: Yes Stairs assistance: Supervision Stair Management: Two rails;Step to pattern;Forwards Number of Stairs: 5 General stair comments: Pt was able to do stairs, took her time with step to pattern, and definitely relies on upper extremity support.  Educated daughter and daughter reinforced that they do this already to always do one step at a time and always have a railing.        Modified Rankin (Stroke Patients Only) Modified Rankin (Stroke Patients Only) Pre-Morbid Rankin Score: No significant disability Modified Rankin: Moderate disability     Balance Overall balance assessment: Needs assistance Sitting-balance support: Feet supported;No upper extremity supported Sitting balance-Leahy Scale: Normal     Standing balance support: No upper extremity supported Standing balance-Leahy Scale: Fair                      Cognition Arousal/Alertness: Awake/alert Behavior During Therapy: WFL for tasks assessed/performed Overall Cognitive Status: Impaired/Different from baseline Area of Impairment: Safety/judgement;Memory     Memory: Decreased short-term memory   Safety/Judgement: Decreased awareness of safety;Decreased awareness of deficits     General Comments: Pt is quick to move and usteady on her feet, no carryover of education preformed yesterday    Exercises General Exercises - Upper Extremity Shoulder Flexion: AAROM;Both;10 reps;Seated Elbow Flexion: AAROM;Both;10 reps;Seated General Exercises - Lower Extremity Ankle Circles/Pumps: AROM;Both;10 reps;Seated Long Arc Quad: AROM;Both;10 reps;Seated Hip  ABduction/ADduction: AROM;Both;10 reps;Seated (adduct only against pillow for resistance) Hip Flexion/Marching: AROM;Both;10 reps;Seated        Pertinent Vitals/Pain See vitals flow sheet.            PT Goals (current  goals can now be found in the care plan section) Acute Rehab PT Goals Patient Stated Goal: none stated Progress towards PT goals: Progressing toward goals    Frequency  Min 4X/week    PT Plan Current plan remains appropriate       End of Session Equipment Utilized During Treatment: Gait belt Activity Tolerance: Patient tolerated treatment well Patient left: in chair;with call bell/phone within reach;with chair alarm set;with family/visitor present     Time: 6761-9509 PT Time Calculation (min): 17 min  Charges:  $Gait Training: 8-22 mins                      Fernie Grimm B. [place], [place], DPT [phone]   10/18/2013, 5:41 PM

## 2013-10-18 NOTE — Telephone Encounter (Signed)
Thanks .Marland Kitchen I'm sure they will consult [place].  Dr. Lemmie Evens

## 2013-10-18 NOTE — Progress Notes (Signed)
Stroke Team Progress Note  HISTORY Rebecca [name] is an 73 y.o. female history of diabetes mellitus, renal insufficiency, previous stroke, coronary artery disease and polymyalgia rheumatica, presenting with new onset weakness involving right side of her face with drooling from the left side of her mouth as well as speech difficulty. She was last known well at 2:15 today 10/16/2013. Symptoms resolved fairly rapidly after arriving in the emergency room. She's been taking aspirin 160 mg per day. CT scan of her head showed no acute intracranial abnormality. NIH stroke score was 1.  Patient was not administered TPA secondary to Rapidly resolving deficits. She was admitted for further evaluation and treatment.  SUBJECTIVE Patient up dressed at bedside. Patient reports a headache last night. Better this am, but still present.  OBJECTIVE Most recent Vital Signs: Filed Vitals:   10/17/13 1745 10/18/13 0150 10/18/13 0504 10/18/13 0942  BP: 142/68  149/64 136/58  Pulse: 73 77 81 92  Temp: 97.9 F (36.6 C) 98.4 F (36.9 C) 97.6 F (36.4 C) 98.6 F (37 C)  TempSrc: Oral Oral Oral Oral  Resp: 18 18 18 20   Height:      Weight:      SpO2: 96% 91% 94% 96%   CBG (last 3)   Recent Labs  10/17/13 2227 10/18/13 0619 10/18/13 1138  GLUCAP 97 112* 129*    IV Fluid Intake:     MEDICATIONS  . clopidogrel  75 mg Oral Q breakfast  . clozapine  300 mg Oral QHS  . diltiazem  180 mg Oral Daily  . enoxaparin (LOVENOX) injection  30 mg Subcutaneous Q24H  . ferrous sulfate  325 mg Oral BID WC  . insulin aspart  0-9 Units Subcutaneous TID WC  . isosorbide mononitrate  60 mg Oral Daily  . levothyroxine  50 mcg Oral QAC breakfast  . loratadine  10 mg Oral Daily  . metoprolol succinate  100 mg Oral Daily  . mometasone-formoterol  2 puff Inhalation BID  . multivitamin with minerals  1 tablet Oral QHS  . omega-3 acid ethyl esters  1 g Oral Daily  . pantoprazole  40 mg Oral Daily  . predniSONE  6 mg Oral Q  breakfast  . simvastatin  10 mg Oral q1800  . temazepam  15 mg Oral QHS  . tiotropium  18 mcg Inhalation Daily  . [START ON 10/20/2013] Vitamin D (Ergocalciferol)  50,000 Units Oral Q7 days   PRN:  acetaminophen, albuterol, nitroGLYCERIN, polyethylene glycol, polyvinyl alcohol, senna-docusate, traMADol  Diet:  Carb Control thin liquids Activity: OOB DVT Prophylaxis:  Lovenox 30 mg sq daily   CLINICALLY SIGNIFICANT STUDIES Basic Metabolic Panel:   Recent Labs Lab 10/16/13 1553 10/16/13 2027  NA 131*  --   K 4.5  --   CL 93*  --   CO2 24  --   GLUCOSE 131*  --   BUN 37*  --   CREATININE 1.80* 1.68*  CALCIUM 10.0  --    Liver Function Tests:   Recent Labs Lab 10/16/13 1553  AST 15  ALT 19  ALKPHOS 39  BILITOT 0.3  PROT 6.5  ALBUMIN 3.2*   CBC:   Recent Labs Lab 10/16/13 1553 10/16/13 2027  WBC 16.0* 14.7*  NEUTROABS 12.3*  --   HGB 10.3* 9.9*  HCT 30.6* 29.0*  MCV 92.4 91.8  PLT 221 185   Coagulation:   Recent Labs Lab 10/16/13 1553  LABPROT 12.8  INR 0.98   Cardiac Enzymes: No  results found for this basename: CKTOTAL, [name], [name], TROPONINI,  in the last 168 hours Urinalysis: No results found for this basename: COLORURINE, APPERANCEUR, LABSPEC, PHURINE, GLUCOSEU, HGBUR, BILIRUBINUR, KETONESUR, PROTEINUR, UROBILINOGEN, NITRITE, LEUKOCYTESUR,  in the last 168 hours Lipid Panel    Component Value Date/Time   CHOL 122 10/17/2013 0513   [name] 80 10/17/2013 0513   [name] 65 10/17/2013 0513   [name] 1.9 10/17/2013 0513   VLDL 16 10/17/2013 0513   [name] 41 10/17/2013 0513   HgbA1C  Lab Results  Component Value Date   HGBA1C 5.7* 10/17/2013    Urine Drug Screen:   No results found for this basename: labopia,  cocainscrnur,  labbenz,  amphetmu,  thcu,  labbarb    Alcohol Level: No results found for this basename: ETH,  in the last 168 hours    CT of the brain  10/16/2013    No acute intracranial abnormality.  Chronic changes as described.    MRI of the  brain  10/16/2013    1. No evidence of acute intracranial abnormality. 2. Mild to moderate chronic small vessel ischemic disease and cerebral atrophy.   MRA of the brain  10/16/2013   . No evidence of major intracranial arterial occlusion or significant stenosis.     2D Echocardiogram  EF 60-65% with no source of embolus.   Carotid Doppler  1-39% internal carotid artery stenosis bilaterally. The right vertebral artery is patent with antegrade flow. The left vertebral artery is patent with antegrade flow, however the waveform is atypical. This along with the 60mmHg gradient between brachial arteries is suggestive of possible developing proximal obstruction.  CXR  10/16/2013   No acute infiltrate or pulmonary edema. Left basilar atelectasis or scarring.     EKG  normal sinus rhythm. For complete results please see formal report.   Therapy Recommendations HH OT  Physical Exam   Filed Vitals:   10/18/13 0504 10/18/13 0942 10/18/13 1100 10/18/13 1456  BP: 149/64 136/58 130/57 151/68  Pulse: 81 92  75  Temp: 97.6 F (36.4 C) 98.6 F (37 C)  99.3 F (37.4 C)  TempSrc: Oral Oral  Oral  Resp: 18 20  20   Height:      Weight:      SpO2: 94% 96%  96%    GENERAL EXAM: Patient is in no distress; well developed, nourished and groomed; neck is supple  CARDIOVASCULAR: Regular rate and rhythm, no murmurs, no carotid bruits; SYMM RADIAL PULSES. NO SUBCLAVIAN BRUITS.  NEUROLOGIC: MENTAL STATUS: awake, alert, language fluent, comprehension intact, naming intact, fund of knowledge appropriate; DECR SHORT TERM MEMORY CRANIAL NERVE: pupils equal and reactive to light, visual fields full to confrontation, extraocular muscles intact, no nystagmus, facial sensation and strength symmetric, hearing intact, palate elevates symmetrically, uvula midline, shoulder shrug symmetric, tongue midline. MOTOR: normal bulk and tone, DIFFUSE 4/5 IN BUE AND BLE, LIMITED BY PAIN SENSORY: normal and symmetric to light touch,  pinprick COORDINATION: finger-nose-finger normal REFLEXES: deep tendon reflexes TRACE and symmetric GAIT/STATION: SITTING IN CHAIR    ASSESSMENT Ms. Rebecca [name] is a 73 y.o. female presenting with Right facial droop and speech difficulty.Imaging confirms no acute infarct. Dx:  Left brain TIA. 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  On aspirin 81 mg orally every day prior to admission. Now on clopidogrel 75 mg orally every day for secondary stroke prevention. Stroke work up completed.  Diabetes, HgbA1c 5.8 in Jan, goal < 7.0  Hyperlipidemia, LDL 41,  on pravachol 20 PTA, now on zocor 10, at goal LDL < 70 for diabetics  Obesity, Body mass index is 30.12 kg/(m^2).   RI, Cr  1.86  Previous stroke  CAD   PMR  Headache. Received ultram last night.  ? Left vertebral distal stenosis/occlusion - ? Subclavian steel. BP L -130/57 R - 150/68   Hospital day # 2  TREATMENT/PLAN  Continue clopidogrel 75 mg orally every day for secondary stroke prevention.  HH OT & PT   Dr. Leta Baptist discussed newly identified L VA stenosis/occlusion with Dr. Candiss Norse who will call and discuss with [facility] - ? CTA neck   Follow up with Dr. Leonie Man, [facility], in 2 months.   Burnetta Sabin, MSN, RN, ANVP-BC, AGPCNP-BC [facility] Pager: [phone] 10/18/2013 1:46 PM   I evaluated and examined patient, reviewed records, labs and imaging, and agree with note and plan. Left vertebral artery waveform abnormality raises possibility of proximal stenosis/occlusion. Right arm BP 150/68; left arm BP 130/57. Raises possibility of subclavian stenosis or steal phenomenon. Agree with vascular consultation and further imaging (CTA neck), although this could be arranged as outpatient within next 1 week vs completed as inpatient.   Penni Bombard, MD 07/20/2033, 5:97 PM Certified in Neurology, Neurophysiology and Neuroimaging [facility] - Stroke Team  Please refer to  [email or web address] for on-call Stroke MD   To contact Stroke Continuity provider, please refer to [email or web address] After hours, contact General Neurology

## 2013-10-18 NOTE — Discharge Instructions (Signed)
Follow with Primary MD [name], MD in 7 days   Get CBC, CMP, checked 7 days by Primary MD and again as instructed by your Primary MD.    Activity: As tolerated with Full fall precautions use walker/cane & assistance as needed   Disposition Home     Diet: Heart Healthy  low carbohydrate  For Heart failure patients - Check your Weight same time everyday, if you gain over 2 pounds, or you develop in leg swelling, experience more shortness of breath or chest pain, call your Primary MD immediately. Follow Cardiac Low Salt Diet and 1.8 lit/day fluid restriction.   On your next visit with her primary care physician please Get Medicines reviewed and adjusted.  Please request your Prim.MD to go over all Hospital Tests and Procedure/Radiological results at the follow up, please get all Hospital records sent to your Prim MD by signing hospital release before you go home.   If you experience worsening of your admission symptoms, develop shortness of breath, life threatening emergency, suicidal or homicidal thoughts you must seek medical attention immediately by calling 911 or calling your MD immediately  if symptoms less severe.  You Must read complete instructions/literature along with all the possible adverse reactions/side effects for all the Medicines you take and that have been prescribed to you. Take any new Medicines after you have completely understood and accpet all the possible adverse reactions/side effects.   Do not drive and provide baby sitting services if your were admitted for syncope or siezures until you have seen by Primary MD or a Neurologist and advised to do so again.  Do not drive when taking Pain medications.    Do not take more than prescribed Pain, Sleep and Anxiety Medications  Special Instructions: If you have smoked or chewed Tobacco  in the last 2 yrs please stop smoking, stop any regular Alcohol  and or any Recreational drug use.  Wear Seat belts while  driving.   Please note  You were cared for by a hospitalist during your hospital stay. If you have any questions about your discharge medications or the care you received while you were in the hospital after you are discharged, you can call the unit and asked to speak with the hospitalist on call if the hospitalist that took care of you is not available. Once you are discharged, your primary care physician will handle any further medical issues. Please note that NO REFILLS for any discharge medications will be authorized once you are discharged, as it is imperative that you return to your primary care physician (or establish a relationship with a primary care physician if you do not have one) for your aftercare needs so that they can reassess your need for medications and monitor your lab values.

## 2013-10-18 NOTE — Discharge Summary (Addendum)
JUNIA PYUN, is a 73 y.o. female  DOB 1940-08-03  MRN [id number].  Admission date:  10/16/2013  Admitting Physician  Shanda Howells, MD  Discharge Date:  10/18/2013   Primary MD  [name], MD  Recommendations for primary care physician for things to follow:   Monitor clinically, monitor glycemic control.   Admission Diagnosis  TIA (transient ischemic attack) [435.9] Diabetes mellitus, type 2 [250.00]   Discharge Diagnosis  TIA (transient ischemic attack) [435.9] Diabetes mellitus, type 2 [250.00]    Active Problems:   TIA (transient ischemic attack)      Past Medical History  Diagnosis Date  . Diabetes mellitus   . Frozen shoulder     right  . Obesity   . Renal insufficiency   . COPD (chronic obstructive pulmonary disease)   . Shortness of breath   . Stroke   . CAD (coronary artery disease) 2006    Taxus stent of mid RCA. negative myoview 2014 and normal LV function  . Diastolic heart failure, NYHA class 1   . Polymyalgia rheumatica     Past Surgical History  Procedure Laterality Date  . Partial hysterectomy  1995  . Abdominal hysterectomy    . Cholecystectomy    . Coronary angioplasty with stent placement  2006    Taxus DES to RCA, 50 % residual     Discharge Condition: stable   Follow UP      Follow-up Information   Follow up with [name], MD. Schedule an appointment as soon as possible for a visit in 1 week.   Specialty:  Internal Medicine   Contact information:   [address] [place] [address] [phone]       Follow up with DICKSON,CHRISTOPHER S, MD. Schedule an appointment as soon as possible for a visit in 1 week.   Specialty:  Vascular Surgery   Contact information:   [address] [place] [address] [phone]       Follow up with [facility]. Schedule an appointment as soon as possible for a visit in 1 month.   Contact information:   [address] [place] [address] [phone]      Follow up with Hennie Duos, MD. Schedule an appointment as soon as possible for a visit in 1 week.   Specialty:  Rheumatology   Contact information:   [place]  [place] [address] [phone]         Discharge Instructions  and  Discharge Medications          Discharge Orders   Future Orders Complete By Expires   Diet - low sodium heart healthy  As directed    Scheduling Instructions:   Low carbohydrate   Discharge instructions  As directed    Scheduling Instructions:   Follow with Primary MD [name], MD in 7 days   Get CBC, CMP, checked 7 days by Primary MD and again as instructed by your Primary MD.  Activity: As tolerated with Full fall precautions use walker/cane & assistance as needed   Disposition Home     Diet: Heart Healthy  low carbohydrate  For Heart failure patients - Check your Weight same time everyday, if you gain over 2 pounds, or you develop in leg swelling, experience more shortness of breath or chest pain, call your Primary MD immediately. Follow Cardiac Low Salt Diet and 1.8 lit/day fluid restriction.   On your next visit with her primary care physician please Get Medicines reviewed and adjusted.  Please request your Prim.MD to go over all Hospital Tests and Procedure/Radiological results at the follow up, please get all Hospital records sent to your Prim MD by signing hospital release before you go home.   If you experience worsening of your admission symptoms, develop shortness of breath, life threatening emergency, suicidal or homicidal thoughts you must seek medical attention immediately by calling 911 or calling your MD immediately  if symptoms less severe.  You Must read complete instructions/literature along with all the possible  adverse reactions/side effects for all the Medicines you take and that have been prescribed to you. Take any new Medicines after you have completely understood and accpet all the possible adverse reactions/side effects.   Do not drive and provide baby sitting services if your were admitted for syncope or siezures until you have seen by Primary MD or a Neurologist and advised to do so again.  Do not drive when taking Pain medications.    Do not take more than prescribed Pain, Sleep and Anxiety Medications  Special Instructions: If you have smoked or chewed Tobacco  in the last 2 yrs please stop smoking, stop any regular Alcohol  and or any Recreational drug use.  Wear Seat belts while driving.   Please note  You were cared for by a hospitalist during your hospital stay. If you have any questions about your discharge medications or the care you received while you were in the hospital after you are discharged, you can call the unit and asked to speak with the hospitalist on call if the hospitalist that took care of you is not available. Once you are discharged, your primary care physician will handle any further medical issues. Please note that NO REFILLS for any discharge medications will be authorized once you are discharged, as it is imperative that you return to your primary care physician (or establish a relationship with a primary care physician if you do not have one) for your aftercare needs so that they can reassess your need for medications and monitor your lab values.   Increase activity slowly  As directed        Medication List    STOP taking these medications       aspirin EC 81 MG tablet      TAKE these medications       acetaminophen 500 MG tablet  Commonly known as:  TYLENOL  Take 1,000 mg by mouth every 6 (six) hours as needed (pain). 2 tablets every morning and then 6 hours as needed     albuterol 108 (90 BASE) MCG/ACT inhaler  Commonly known as:  PROVENTIL  HFA;VENTOLIN HFA  Inhale 2 puffs into the lungs every 6 (six) hours as needed for wheezing or shortness of breath.     alendronate 70 MG tablet  Commonly known as:  FOSAMAX  Take 1 tablet (70 mg total) by mouth every 7 (seven) days. Take with a full glass of water on an  empty stomach.     cetirizine 10 MG tablet  Commonly known as:  ZYRTEC  Take 10 mg by mouth at bedtime.     clopidogrel 75 MG tablet  Commonly known as:  PLAVIX  Take 1 tablet (75 mg total) by mouth daily with breakfast.     clozapine 200 MG tablet  Commonly known as:  CLOZARIL  Take 300 mg by mouth at bedtime. Take 1.5 tablets (300 mg) at bedtime     COLACE 100 MG capsule  Generic drug:  docusate sodium  Take 200 mg by mouth at bedtime.     diltiazem 180 MG 24 hr capsule  Commonly known as:  DILACOR XR  Take 180 mg by mouth daily.     ferrous sulfate 325 (65 FE) MG EC tablet  Take 325 mg by mouth 2 (two) times daily.     Fish Oil 1200 MG Caps  Take 1-2 capsules by mouth at bedtime.     Fluticasone-Salmeterol 250-50 MCG/DOSE Aepb  Commonly known as:  ADVAIR  Inhale 1 puff into the lungs at bedtime.     GUAIFEN-PSE PO  Take 600 mg by mouth daily as needed (congestion).     isosorbide mononitrate 60 MG 24 hr tablet  Commonly known as:  IMDUR  Take 1 tablet (60 mg total) by mouth daily.     levothyroxine 50 MCG tablet  Commonly known as:  SYNTHROID, LEVOTHROID  Take 50 mcg by mouth daily before breakfast.     metoprolol succinate 100 MG 24 hr tablet  Commonly known as:  TOPROL-XL  Take 100 mg by mouth daily. Take with or immediately following a meal.     multivitamin with minerals Tabs tablet  Take 1 tablet by mouth at bedtime. [employer]     nitroGLYCERIN 0.4 MG SL tablet  Commonly known as:  NITROSTAT  Place 1 tablet (0.4 mg total) under the tongue every 5 (five) minutes as needed. For chest pain     pantoprazole 40 MG tablet  Commonly known as:  PROTONIX  Take 1 tablet (40 mg total)  by mouth daily.     polyethylene glycol packet  Commonly known as:  MIRALAX / GLYCOLAX  Take 17 g by mouth daily as needed (constipation).     pravastatin 20 MG tablet  Commonly known as:  PRAVACHOL  Take 20 mg by mouth at bedtime.     predniSONE 1 MG tablet  Commonly known as:  DELTASONE  Take 6 mg by mouth daily with breakfast.     SM CRANBERRY 300 MG tablet  Generic drug:  Cranberry  Take 300 mg by mouth at bedtime.     SYSTANE OP  Apply 1 drop to eye 2 (two) times daily as needed (dry eyes).     temazepam 15 MG capsule  Commonly known as:  RESTORIL  Take 15 mg by mouth at bedtime.     tiotropium 18 MCG inhalation capsule  Commonly known as:  SPIRIVA  Place 18 mcg into inhaler and inhale daily.     traMADol 50 MG tablet  Commonly known as:  ULTRAM  Take 50-100 mg by mouth 2 (two) times daily as needed for severe pain.     Vitamin D (Ergocalciferol) 50000 UNITS Caps capsule  Commonly known as:  DRISDOL  Take 1 capsule (50,000 Units total) by mouth every 7 (seven) days.          Diet and Activity recommendation: See Discharge Instructions above  Consults obtained - Neuro   Major procedures and Radiology Reports - PLEASE review detailed and final reports for all details, in brief -   Carotid   Carotid Duplex (Doppler) has been completed.  Findings suggest 1-39% internal carotid artery stenosis bilaterally. The right vertebral artery is patent with antegrade flow. The left vertebral artery is patent with antegrade flow, however the waveform is atypical. This along with the 59mmHg gradient between brachial arteries is suggestive of possible developing proximal obstruction.     Echo  - Left ventricle: The cavity size was normal. There was moderate concentric hypertrophy. Systolic function was normal. The estimated ejection fraction was in the range of 60% to 65%. Wall motion was normal; there were no regional wall motion abnormalities. There was an  increased relative contribution of atrial contraction to ventricular filling. Doppler parameters are consistent with abnormal left ventricular relaxation (grade 1 diastolic dysfunction). - Aortic valve: Trivial regurgitation.     Dg Chest 2 View  10/16/2013   CLINICAL DATA:  Stroke, confusion  EXAM: CHEST  2 VIEW  COMPARISON:  07/16/2013  FINDINGS: Cardiomediastinal silhouette is stable. Mild degenerative changes thoracic spine. No acute infiltrate or pulmonary edema. Left base atelectasis or scarring.  IMPRESSION: No acute infiltrate or pulmonary edema. Left basilar atelectasis or scarring.   Electronically Signed   By: Lahoma Crocker M.D.   On: 10/16/2013 21:31   Ct Head (brain) Wo Contrast  10/16/2013   CLINICAL DATA:  Code stroke.  Facial droop.  Left hand weakness.  EXAM: CT HEAD WITHOUT CONTRAST  TECHNIQUE: Contiguous axial images were obtained from the base of the skull through the vertex without contrast.  COMPARISON:  02/25/2012.  FINDINGS: No evidence for acute infarction, hemorrhage, mass lesion, hydrocephalus, or extra-axial fluid. Mild atrophy. Mild chronic microvascular ischemic change. Remote right cerebellar infarct. No large vessel infarct of the cerebral hemispheres. Negative orbits, sinuses, and mastoids. Similar appearance to priors.  IMPRESSION: No acute intracranial abnormality.  Chronic changes as described.  Critical Value/emergent results were called by telephone at the time of interpretation on 10/16/2013 at 4:15 PM to Dr. Wallie Char, who verbally acknowledged these results.   Electronically Signed   By: Rolla Flatten M.D.   On: 10/16/2013 16:17   Mr Angiogram Head Wo Contrast  10/16/2013   CLINICAL DATA:  Left-sided facial droop and headache.  Weakness.  EXAM: MRI HEAD WITHOUT CONTRAST  MRA HEAD WITHOUT CONTRAST  TECHNIQUE: Multiplanar, multiecho pulse sequences of the brain and surrounding structures were obtained without intravenous contrast. Angiographic images of the head  were obtained using MRA technique without contrast.  COMPARISON:  Head CT 10/16/2013  FINDINGS: MRI HEAD FINDINGS  There is no acute infarct. Small, remote right cerebellar infarct is noted. Patchy T2 hyperintensities in the subcortical and deep cerebral white matter are nonspecific but compatible with mild to moderate chronic small vessel ischemic disease. Mild to moderate cerebral atrophy is present. There is no evidence of intracranial hemorrhage, mass, midline shift, or extra-axial fluid collection.  Prior bilateral cataract surgery is noted. Paranasal sinuses are clear. Small amount of left mastoid fluid is present. Major intracranial vascular flow voids are preserved.  MRA HEAD FINDINGS  The visualized distal vertebral arteries are patent with the right being dominant. Right PICA origin is patent. Left PICA is not identified. AICA origins are patent, with the left being dominant. Basilar artery is patent without stenosis. SCA origins are patent. There are essentially fetal origins of the PCAs with hypoplastic P1 segments  present bilaterally. PCAs are otherwise unremarkable.  Internal carotid arteries are patent from skullbase to carotid termini. ACAs and MCAs are unremarkable. No intracranial aneurysm is identified.  IMPRESSION: 1. No evidence of acute intracranial abnormality. 2. Mild to moderate chronic small vessel ischemic disease and cerebral atrophy. 3. No evidence of major intracranial arterial occlusion or significant stenosis.   Electronically Signed   By: Logan Bores   On: 10/16/2013 20:09   Mr Brain Wo Contrast  10/16/2013   CLINICAL DATA:  Left-sided facial droop and headache.  Weakness.  EXAM: MRI HEAD WITHOUT CONTRAST  MRA HEAD WITHOUT CONTRAST  TECHNIQUE: Multiplanar, multiecho pulse sequences of the brain and surrounding structures were obtained without intravenous contrast. Angiographic images of the head were obtained using MRA technique without contrast.  COMPARISON:  Head CT 10/16/2013   FINDINGS: MRI HEAD FINDINGS  There is no acute infarct. Small, remote right cerebellar infarct is noted. Patchy T2 hyperintensities in the subcortical and deep cerebral white matter are nonspecific but compatible with mild to moderate chronic small vessel ischemic disease. Mild to moderate cerebral atrophy is present. There is no evidence of intracranial hemorrhage, mass, midline shift, or extra-axial fluid collection.  Prior bilateral cataract surgery is noted. Paranasal sinuses are clear. Small amount of left mastoid fluid is present. Major intracranial vascular flow voids are preserved.  MRA HEAD FINDINGS  The visualized distal vertebral arteries are patent with the right being dominant. Right PICA origin is patent. Left PICA is not identified. AICA origins are patent, with the left being dominant. Basilar artery is patent without stenosis. SCA origins are patent. There are essentially fetal origins of the PCAs with hypoplastic P1 segments present bilaterally. PCAs are otherwise unremarkable.  Internal carotid arteries are patent from skullbase to carotid termini. ACAs and MCAs are unremarkable. No intracranial aneurysm is identified.  IMPRESSION: 1. No evidence of acute intracranial abnormality. 2. Mild to moderate chronic small vessel ischemic disease and cerebral atrophy. 3. No evidence of major intracranial arterial occlusion or significant stenosis.   Electronically Signed   By: Logan Bores   On: 10/16/2013 20:09      Micro Results      No results found for this or any previous visit (from the past 240 hour(s)).   History of present illness and  Hospital Course:     Kindly see H&P for history of present illness and admission details, please review complete Labs, Consult reports and Test reports for all details in brief Caroleann A Dutson, is a 73 y.o. female, patient with history polymyalgia rheumatica on steroids, type 2 diabetes mellitus on diet control, hypertension, hypothyroidism, COPD,  dyslipidemia stroke in the past, CKD stage IV, CAD status post stent placement, chronic diastolic dysfunction presented to the hospital with chief complaints of left-sided facial droop with some weakness and speech difficulty due to TIA.   She had a full TIA workup which included CT scan of the brain, MRI MRA of the brain both of which were nonacute, echogram was stable, carotid Doppler had some nonspecific findings for which I will request her to follow with vascular surgery one time. Her symptoms have completely resolved. She was seen by neuro was switched her from aspirin to Plavix. She will be discharged home with home physical therapy for acute on chronic generalized weakness due to polymyalgia rheumatica. She will also follow with neurology one time post discharge along with her PCP.      Her other medical problems which include type 2 diabetes  mellitus in that control, hypertension, chronic kidney disease stage IV, CAD, COPD, hypothyroidism, dyslipidemia, chronic diastolic CHF are all stable and she will commence her home medications unchanged.    For her polymyalgia rheumatica she is currently on a steroid taper which she will continue and finally follow with her PCP. Had mild nonspecific leukocytosis arising from steroid taper. No signs of infection.    Non specific Carotid US findings D/W Vas Surgeon Dr Scot Dock, no further intervention, will follow in the office.   Patient has permission from me to return to her adult care center for enrichment as before. She has no contagious disease.      Kindly note patient's daughter was updated bedside in the morning personally, got phone calls from patient's second daughter 3 times during the day, last phone call was just prior to the discharge, she was clearly explained that her mother is nonspecific carotid artery ultrasound findings did not require immediate surgical intervention and that this was discussed by me personally with Dr.  Scot Dock vascular surgeon wanted to see the patient the office, she also had Dr. Vanetta Mulders renal physician called and discussed the case with me earlier and I had updated Dr. Moshe Cipro of the same. She also requested me to recommend her mother to a rheumatologist for her chronic polymyalgia rheumatica, which I did recommended her to Dr. Amil Amen for the same, she was very unsatisfied with the prednisone changes that were made by patient's PCP who according to the daughter was a new PCP.   Daughter wanted assurance that patient will not be readmitted for health issues, she was told clearly that unfortunately in medicine there are no readmission guarantees, and that with her mother being 83 years old with multiple chronic health issues this can happen. However at this time I do not anticipate any further inpatient workup and in my opinion she could be discharged with outpatient followup.   In short total conversations with family members on the day of discharge were 4, with Dr. Vanetta Mulders upon daughter's request once. Discussed with Dr. Scot Dock vascular surgeon, neurologist on the day of discharge.      Today   Subjective:   Cheyenne Smarr today has no headache,no chest abdominal pain,no new weakness tingling or numbness, feels much better wants to go home today.   Objective:   Blood pressure 151/68, pulse 75, temperature 99.3 F (37.4 C), temperature source Oral, resp. rate 20, height 5\' 3"  (1.6 m), weight 77.111 kg (170 lb), SpO2 96.00%.   Intake/Output Summary (Last 24 hours) at 10/18/13 1518 Last data filed at 10/18/13 1312  Gross per 24 hour  Intake   1320 ml  Output      0 ml  Net   1320 ml    Exam Awake Alert, Oriented *3, No new F.N deficits, Normal affect Watson.AT,PERRAL Supple Neck,No JVD, No cervical lymphadenopathy appriciated.  Symmetrical Chest wall movement, Good air movement bilaterally, CTAB RRR,No Gallops,Rubs or new Murmurs, No Parasternal Heave +ve  B.Sounds, Abd Soft, Non tender, No organomegaly appriciated, No rebound -guarding or rigidity. No Cyanosis, Clubbing or edema, No new Rash or bruise  Data Review   Lab Results  Component Value Date   HGBA1C 5.7* 10/17/2013    Lab Results  Component Value Date   CHOL 122 10/17/2013   [name] 65 10/17/2013   [name] 41 10/17/2013   LDLDIRECT 88 09/05/2007   [name] 80 10/17/2013   [name] 1.9 10/17/2013     CBC w Diff: Lab Results  Component Value Date   WBC 14.7* 10/16/2013   WBC 10.5 06/21/2012   HGB 9.9* 10/16/2013   HGB 10.0* 07/16/2013   HCT 29.0* 10/16/2013   PLT 185 10/16/2013   LYMPHOPCT 15 10/16/2013   MONOPCT 6 10/16/2013   EOSPCT 2 10/16/2013   BASOPCT 0 10/16/2013    CMP: Lab Results  Component Value Date   NA 131* 10/16/2013   NA 137 06/21/2012   K 4.5 10/16/2013   CL 93* 10/16/2013   CO2 24 10/16/2013   BUN 37* 10/16/2013   BUN 33* 06/21/2012   CREATININE 1.68* 10/16/2013   CREATININE 1.85* 07/16/2013   CREATININE 1.6* 06/21/2012   GLU 77 06/21/2012   PROT 6.5 10/16/2013   ALBUMIN 3.2* 10/16/2013   BILITOT 0.3 10/16/2013   ALKPHOS 39 10/16/2013   AST 15 10/16/2013   ALT 19 10/16/2013  .   Total Time in preparing paper work, data evaluation and todays exam - 35 minutes  Thurnell Lose M.D on 10/18/2013 at 3:18 PM  [facility] Office  [phone]

## 2013-10-18 NOTE — Care Management Note (Signed)
    Page 1 of 2   10/18/2013     3:10:28 PM CARE MANAGEMENT NOTE 10/18/2013  Patient:  Rebecca [name], Rebecca [name]   Account Number:  [id number]  Date Initiated:  10/18/2013  Documentation initiated by:  ROBARGE,COURTNEY  Subjective/Objective Assessment:   Patient was admitted with stroke symptoms.  Lives at home with daughter Rebecca [name].     Action/Plan:   Will follow for discharge needs pending PT/OT evals and physician orders.   Anticipated DC Date:  10/18/2013   Anticipated DC Plan:  [place]  CM consult      Choice offered to / List presented to:     DME arranged  [place]      DME agency  [place] arranged  [place].   Status of service:  Completed, signed off Medicare Important Message given?   (If response is "NO", the following Medicare IM given date fields will be blank) Date Medicare IM given:   Date Additional Medicare IM given:    Discharge Disposition:  [place]  Per UR Regulation:  Reviewed for med. necessity/level of care/duration of stay  If discussed at [place] of Stay Meetings, dates discussed:    Comments:  10/18/13 1200 Met with patient/spoke with patient's daughter Rebecca [name] via phone regarding home health needs.  Per daughter, patient already has the 3N1 at home, but is in need of [name] new rolling walker with seat.  Message sent to Dr Candiss Norse requesting the walker.  Patient's daughter has chosen [facility] for HHPT/OT, as they have used them in the past.  Patient's daughter also reports concerns with additional help in the home.  Daughter is currently undergoing treatment for cancer, and is not able to provide the level of care she is accostomed to.  Patient's other daughter is visiting from out of town to help initially. CM provided [name] private duty list for any additional needs.

## 2013-10-18 NOTE — Telephone Encounter (Signed)
Spoke with Jacqulyne Suire's daughter [name]. Patient is admitted to [facility] on 4th floor (Neuro, Room 3) with questionable stroke/TIA. She states she requested cardiology be consulted when patient presented to ED, but has not yet happened. She states they are wanted to do some test that involves dye and looking at her carotid arteries and her nephrologists has OK'ed this but the daughter wants Dr. Debara Pickett to see patient. Informed daughter that Dr. Debara Pickett would be notified but that he cannot treat patient in hospital without consult from the team managing her care, and reiterated this more than once. Daughter voiced understanding and provided RN with her cell [phone].   Will forward to Dr. Debara Pickett as Juluis Rainier

## 2013-10-18 NOTE — Progress Notes (Signed)
Daughter Kimaya Whitlatch verbalized concerns taking mother home and would like Dr Debara Pickett, patient"s cardiology be called in room "right now" Dr. Candiss Norse paged, made aware. Spoke to daughter over the phone. Daughter then wrote long note on the D/C signature page about her conversation with discharging Physician, Dr Candiss Norse, to have RN discharging to sign for record. Told was not part of discussion and could not do to. Printed new signature page , marked X and signed.Catalina Gravel would like her "written page" be a part of mother medical record. Stroke/TIA and heart Failurel educational hands out given; questions answered, and patient D/C home with family in stable condition.

## 2013-10-19 DIAGNOSIS — D72829 Elevated white blood cell count, unspecified: Secondary | ICD-10-CM | POA: Diagnosis not present

## 2013-10-19 DIAGNOSIS — M353 Polymyalgia rheumatica: Secondary | ICD-10-CM | POA: Diagnosis not present

## 2013-10-20 NOTE — Progress Notes (Signed)
OT evaluation addendum   10/17/13 1004  OT Time Calculation  OT Start Time 0842  OT Stop Time 0906  OT Time Calculation (min) 24 min  OT G-codes **NOT FOR INPATIENT CLASS**  Functional Assessment Tool Used clinical judgment  Functional Limitation Self care  Self Care Current Status [phone]) CI  Self Care Goal Status (W6203) [place]  OT General Charges  $OT Visit 1 Procedure  OT Evaluation  $Initial OT Evaluation Tier I 1 Procedure  OT Treatments  $Self Care/Home Management  8-22 mins   Roseanne Reno, OTR/L [phone]

## 2013-10-22 ENCOUNTER — Encounter: Payer: Self-pay | Admitting: Vascular Surgery

## 2013-10-23 ENCOUNTER — Encounter: Payer: Self-pay | Admitting: Vascular Surgery

## 2013-10-23 ENCOUNTER — Ambulatory Visit (INDEPENDENT_AMBULATORY_CARE_PROVIDER_SITE_OTHER): Payer: Medicare Other | Admitting: Vascular Surgery

## 2013-10-23 VITALS — BP 126/78 | HR 84 | Resp 18 | Ht 63.0 in | Wt 167.3 lb

## 2013-10-23 DIAGNOSIS — G459 Transient cerebral ischemic attack, unspecified: Secondary | ICD-10-CM | POA: Diagnosis not present

## 2013-10-23 NOTE — Progress Notes (Signed)
Patient name: Rebecca [name] MRN: [id number] DOB: [birth year] Sex: female   Referred by: Trial hospitalist  Reason for referral:  Chief Complaint  Patient presents with  . Follow-up    follow up from [facility] admission/TIA symptoms   . 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 She is here today with 2 daughters. She has multiple medical problems and multiple prior diagnoses. 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 She had an episode of facial drooping and drooling and was admitted to the hospital. She had an extensive workup including CT a MRI and duplex. The diagnosis was felt to be related to small vessel disease intracranially. Her carotid duplex showed no evidence of any carotid stenosis. There was mildly atypical left vertebral flow however it was antegrade. And the interpretation there was a 15 mm differential in her radial pulse inset of the interpretation was possible vertebral disease. On further review of this patient actually had higher brachial pressure on the left than on the right which would not suggest any vertebral steal. She has returned to near baseline. She does have main complaint of headache currently.  Past Medical History  Diagnosis Date  . Diabetes mellitus   . Frozen shoulder     right  . Obesity   . Renal insufficiency   . COPD (chronic obstructive pulmonary disease)   . Shortness of breath   . Stroke   . CAD (coronary artery disease) 2006    Taxus stent of mid RCA. negative myoview 2014 and normal LV function  . Diastolic heart failure, NYHA class 1   . Polymyalgia rheumatica     Past Surgical History  Procedure Laterality Date  . Partial hysterectomy  1995  . Abdominal hysterectomy    . Cholecystectomy    . Coronary angioplasty with stent placement  2006    Taxus DES to RCA, 50 %  residual    History   Social History  . Marital Status: Divorced    Spouse Name: N/A    Number of Children: N/A  . Years of Education: N/A   Occupational History  . Not on file.   Social History Main Topics  . Smoking status: Former Smoker -- 1.00 packs/day for 35 years    Quit date: 04/14/2004  . Smokeless tobacco: Not on file  . Alcohol Use: No  . Drug Use: No  . Sexual Activity: Not on file   Other Topics Concern  . Not on file   Social History Narrative   Daughter [name] and son are very involved.  [name] comes to most office visits.   Recent resident at [facility]; daughter unhappy with care and taking her to live at home, after Jan 30th. Daughter is HCPOA, not averse to SNF/rehab in short-term, but does not wish pt to return to [facility] and requests that pt/pt's care not be discussed by [facility] providers to [facility], moving forward.    Family History  Problem Relation Age of Onset  . Diabetes Mother   . Heart disease Mother   . Heart disease Father   . Arthritis Father   . Healthy Sister   . Heart disease Brother   . Osteoporosis Sister   . Heart disease Brother   . Diabetes Brother   . Heart disease Brother   . Diabetes Brother   .  Diabetes Brother   . Healthy Brother   . Healthy Brother   . Healthy Brother     Allergies as of 10/23/2013  . (No Known Allergies)    Current Outpatient Prescriptions on File Prior to Visit  Medication Sig Dispense Refill  . acetaminophen (TYLENOL) 500 MG tablet Take 1,000 mg by mouth every 6 (six) hours as needed (pain). 2 tablets every morning and then 6 hours as needed      . albuterol (PROVENTIL HFA;VENTOLIN HFA) 108 (90 BASE) MCG/ACT inhaler Inhale 2 puffs into the lungs every 6 (six) hours as needed for wheezing or shortness of breath.      Marland Kitchen alendronate (FOSAMAX) 70 MG tablet Take 1 tablet (70 mg total) by mouth every 7 (seven) days. Take with a full glass of water on an empty stomach.  4 tablet  5    . cetirizine (ZYRTEC) 10 MG tablet Take 10 mg by mouth at bedtime.       . clopidogrel (PLAVIX) 75 MG tablet Take 1 tablet (75 mg total) by mouth daily with breakfast.  30 tablet  0  . clozapine (CLOZARIL) 200 MG tablet Take 300 mg by mouth at bedtime. Take 1.5 tablets (300 mg) at bedtime      . Cranberry (SM CRANBERRY) 300 MG tablet Take 300 mg by mouth at bedtime.       Marland Kitchen diltiazem (DILACOR XR) 180 MG 24 hr capsule Take 180 mg by mouth daily.      Marland Kitchen docusate sodium (COLACE) 100 MG capsule Take 200 mg by mouth at bedtime.   10 capsule  0  . ferrous sulfate 325 (65 FE) MG EC tablet Take 325 mg by mouth 2 (two) times daily.        . Fluticasone-Salmeterol (ADVAIR) 250-50 MCG/DOSE AEPB Inhale 1 puff into the lungs at bedtime.      . isosorbide mononitrate (IMDUR) 60 MG 24 hr tablet Take 1 tablet (60 mg total) by mouth daily.  30 tablet  6  . levothyroxine (SYNTHROID, LEVOTHROID) 50 MCG tablet Take 50 mcg by mouth daily before breakfast.      . metoprolol succinate (TOPROL-XL) 100 MG 24 hr tablet Take 100 mg by mouth daily. Take with or immediately following a meal.      . Multiple Vitamin (MULTIVITAMIN WITH MINERALS) TABS Take 1 tablet by mouth at bedtime. Psychiatrist      . nitroGLYCERIN (NITROSTAT) 0.4 MG SL tablet Place 1 tablet (0.4 mg total) under the tongue every 5 (five) minutes as needed. For chest pain  25 tablet  3  . Omega-3 Fatty Acids (FISH OIL) 1200 MG CAPS Take 1-2 capsules by mouth at bedtime.       . pantoprazole (PROTONIX) 40 MG tablet Take 1 tablet (40 mg total) by mouth daily.  30 tablet  11  . Polyethyl Glycol-Propyl Glycol (SYSTANE OP) Apply 1 drop to eye 2 (two) times daily as needed (dry eyes).      . polyethylene glycol (MIRALAX / GLYCOLAX) packet Take 17 g by mouth daily as needed (constipation).      . pravastatin (PRAVACHOL) 20 MG tablet Take 20 mg by mouth at bedtime.      . predniSONE (DELTASONE) 1 MG tablet Take 6 mg by mouth daily with breakfast.       .  Pseudoephedrine-Guaifenesin (GUAIFEN-PSE PO) Take 600 mg by mouth daily as needed (congestion).       . temazepam (RESTORIL) 15 MG capsule Take 15  mg by mouth at bedtime as needed.       . tiotropium (SPIRIVA) 18 MCG inhalation capsule Place 18 mcg into inhaler and inhale daily.      . traMADol (ULTRAM) 50 MG tablet Take 50-100 mg by mouth 2 (two) times daily as needed for severe pain.      . Vitamin D, Ergocalciferol, (DRISDOL) 50000 UNITS CAPS capsule Take 1 capsule (50,000 Units total) by mouth every 7 (seven) days.  4 capsule  0   No current facility-administered medications on file prior to visit.     REVIEW OF SYSTEMS:  Positives indicated with an "X"  CARDIOVASCULAR:  [ ]  chest pain   [ ]  chest pressure   [ ]  palpitations   [ ]  orthopnea   [ ]  dyspnea on exertion   [ ]  claudication   [ ]  rest pain   [ ]  DVT   [ ]  phlebitis PULMONARY:   [x ] productive cough   [ ]  asthma   [x ] wheezing NEUROLOGIC:   [x ] weakness  [ x] paresthesias  [ ]  aphasia  [ ]  amaurosis  [x ] dizziness HEMATOLOGIC:   [ ]  bleeding problems   [ ]  clotting disorders MUSCULOSKELETAL:  [ ]  joint pain   [ ]  joint swelling GASTROINTESTINAL: [ ]   blood in stool  [ ]   hematemesis GENITOURINARY:  [x ]  dysuria  [ ]   hematuria PSYCHIATRIC:  [x ] history of major depression INTEGUMENTARY:  [ ]  rashes  [ ]  ulcers CONSTITUTIONAL:  [ ]  fever   [ ]  chills  PHYSICAL EXAMINATION:  General: The patient is a well-nourished female, in no acute distress. Vital signs are BP 126/78  Pulse 84  Resp 18  Ht 5\' 3"  (1.6 m)  Wt 167 lb 4.8 oz (75.887 kg)  BMI 29.64 kg/m2 Pulmonary: There is a good air exchange bilaterally without wheezing or rales. Abdomen: Soft and non-tender with normal pitch bowel sounds. Musculoskeletal: There are no major deformities.  There is no significant extremity pain. Neurologic: No focal weakness or paresthesias are detected, Skin: There are no ulcer or rashes noted. Psychiatric: The patient has  normal affect. Cardiovascular: There is a regular rate and rhythm without significant murmur appreciated. 2+ radial pulses bilaterally. She has 2+ dorsalis pedis pulses bilaterally Carotid arteries without bruits    Impression and Plan:  And a very long discussion with the patient and her daughters present. I did review her MRI and CT and also duplex from recent hospitalization. I explained that she has no evidence of any carotid disease and very soft indication of any proximal vertebral disease. I did explain that there was some confusion regarding left versus right regarding her brachial pressures. I do not see any indication that there was any suspicion for vertebrobasilar insufficiency in her entire workup. I did explain that the CT angiogram which would suggested has been canceled the 2 renal insufficiency and with certain not recommend any potential risk to her kidneys with very slight indication that she may have vertebral disease and likelihood that there would not recommend any treatment even if this was found. They understand this discussion will see [place] again on a when necessary basis. He is to see Dr.Sethi in one to 2 weeks for continued followup. They have multiple questions regarding Plavix and other treatment for her headache etc. and will defer this to neurology.    Arvilla Meres Lella Mullany [facility] Office: [phone]

## 2013-11-07 DIAGNOSIS — R339 Retention of urine, unspecified: Secondary | ICD-10-CM | POA: Diagnosis not present

## 2013-11-08 DIAGNOSIS — Z79899 Other long term (current) drug therapy: Secondary | ICD-10-CM | POA: Diagnosis not present

## 2013-11-13 ENCOUNTER — Ambulatory Visit: Payer: Self-pay | Admitting: Internal Medicine

## 2013-11-14 DIAGNOSIS — M5137 Other intervertebral disc degeneration, lumbosacral region: Secondary | ICD-10-CM | POA: Diagnosis not present

## 2013-11-14 DIAGNOSIS — IMO0002 Reserved for concepts with insufficient information to code with codable children: Secondary | ICD-10-CM | POA: Diagnosis not present

## 2013-11-14 DIAGNOSIS — M48061 Spinal stenosis, lumbar region without neurogenic claudication: Secondary | ICD-10-CM | POA: Diagnosis not present

## 2013-11-14 DIAGNOSIS — M545 Low back pain, unspecified: Secondary | ICD-10-CM | POA: Diagnosis not present

## 2013-11-16 ENCOUNTER — Other Ambulatory Visit: Payer: Self-pay | Admitting: Family Medicine

## 2013-11-16 ENCOUNTER — Other Ambulatory Visit: Payer: Self-pay | Admitting: Internal Medicine

## 2013-11-19 DIAGNOSIS — M353 Polymyalgia rheumatica: Secondary | ICD-10-CM | POA: Diagnosis not present

## 2013-11-19 DIAGNOSIS — D72829 Elevated white blood cell count, unspecified: Secondary | ICD-10-CM | POA: Diagnosis not present

## 2013-11-20 ENCOUNTER — Telehealth: Payer: Self-pay | Admitting: Nurse Practitioner

## 2013-11-20 ENCOUNTER — Telehealth: Payer: Self-pay | Admitting: Internal Medicine

## 2013-11-20 DIAGNOSIS — R809 Proteinuria, unspecified: Secondary | ICD-10-CM | POA: Diagnosis not present

## 2013-11-20 DIAGNOSIS — N390 Urinary tract infection, site not specified: Secondary | ICD-10-CM | POA: Diagnosis not present

## 2013-11-20 DIAGNOSIS — R82998 Other abnormal findings in urine: Secondary | ICD-10-CM | POA: Diagnosis not present

## 2013-11-20 NOTE — Telephone Encounter (Signed)
Spoke with patient's daughter and she would like sooner appt.for mother, having some problems, does not want to wait until August. She is seeing Dr Nelva Bush and is having surgery but needs to be off all blood thinners for 5 days prior(11/29/13), needs clearance, also having headaches/neck pain

## 2013-11-20 NOTE — Telephone Encounter (Signed)
Daughter requesting an earlier appointment than 8/10 with Jeani Hawking.  Patient complaining of headaches and pain behind eye.  She prefer mother see's MD instead of NP.  Please call and advise.

## 2013-11-20 NOTE — Telephone Encounter (Signed)
Forward to Dr Josepha Pigg RN  Can you clear patient- holding PLAVIX for 5 days or change appointment ,or change injection time

## 2013-11-20 NOTE — Telephone Encounter (Signed)
Yes ,, she can safely stop plavix for 5 days. Restart after.  Dr. Debara Pickett

## 2013-11-20 NOTE — Telephone Encounter (Signed)
Daughter is requesting that patient be seen by MD, not NP so we should honor that request.  She is requesting to be seen for headaches and neck pain, which are new complaints and should be a new consult.  She was seen in the hospital by Dr. Janann Colonel and Dr. Leta Baptist, maybe they have a sooner consult slot?

## 2013-11-20 NOTE — Telephone Encounter (Signed)
Has appointment on the 30th with Dr Debara Pickett  Has spinal injection scheduled for 24th June--Needs off plavix for 5 full days  Wants to know if she can get earlier appt to see Dr Debara Pickett to get approval to be off plavix  Please call

## 2013-11-20 NOTE — Telephone Encounter (Signed)
RN spoke to daughter [name]) informed her that DR Debara Pickett order- okay to stop plavix for 5 days Daughter states Dr Nelva Bush' office has sent clearance form. RN discussed with Armed forces technical officer. Form has not been received.  Daughter states she will have Dr Jeralyn Ruths office -fax form

## 2013-11-21 ENCOUNTER — Encounter: Payer: Self-pay | Admitting: *Deleted

## 2013-11-21 NOTE — Telephone Encounter (Signed)
Spoke with daughter and she would like to know if maybe the Polymyalgia Rheumatica or something from the TIA that she had  could be causing the eye/neck pain? Is a Sooner appt needed? Would like a clearance for surgery from Dr Ramos(5 days prior to 11/29/13 to be off blood thinners)

## 2013-11-21 NOTE — Telephone Encounter (Signed)
Faxed letter stating OK to hold Plavix 5 days prior to procedure and restart after [place] [phone] for lumbar ESI on 12/05/13

## 2013-11-22 NOTE — Telephone Encounter (Addendum)
Spoke with patient's daughter(Amanda) informed that a sooner appt. Is to be scheduled,ok'd by Dr Leta Baptist and that Dr Hilty(Cardiologist)has given surgery clearance to Dr Nelva Bush, she verbalized understanding and scheduled and confirmed

## 2013-11-22 NOTE — Telephone Encounter (Signed)
Yes sooner appt. 1-2 weeks with Rebecca [name] or me. -VRP

## 2013-11-26 ENCOUNTER — Ambulatory Visit (INDEPENDENT_AMBULATORY_CARE_PROVIDER_SITE_OTHER): Payer: Medicare Other | Admitting: Diagnostic Neuroimaging

## 2013-11-26 ENCOUNTER — Encounter: Payer: Self-pay | Admitting: Diagnostic Neuroimaging

## 2013-11-26 VITALS — BP 109/68 | HR 65 | Temp 97.8°F | Ht 63.0 in | Wt 166.0 lb

## 2013-11-26 DIAGNOSIS — G894 Chronic pain syndrome: Secondary | ICD-10-CM | POA: Diagnosis not present

## 2013-11-26 DIAGNOSIS — G459 Transient cerebral ischemic attack, unspecified: Secondary | ICD-10-CM

## 2013-11-26 DIAGNOSIS — M353 Polymyalgia rheumatica: Secondary | ICD-10-CM

## 2013-11-26 DIAGNOSIS — R51 Headache: Secondary | ICD-10-CM

## 2013-11-26 DIAGNOSIS — M549 Dorsalgia, unspecified: Secondary | ICD-10-CM | POA: Diagnosis not present

## 2013-11-26 DIAGNOSIS — I2510 Atherosclerotic heart disease of native coronary artery without angina pectoris: Secondary | ICD-10-CM | POA: Diagnosis not present

## 2013-11-26 DIAGNOSIS — R413 Other amnesia: Secondary | ICD-10-CM

## 2013-11-26 DIAGNOSIS — M25569 Pain in unspecified knee: Secondary | ICD-10-CM | POA: Diagnosis not present

## 2013-11-26 DIAGNOSIS — R070 Pain in throat: Secondary | ICD-10-CM | POA: Diagnosis not present

## 2013-11-26 NOTE — Progress Notes (Signed)
[facility]  PATIENT: Rebecca [name] DOB: 01/28/1941  REFERRING CLINICIAN:  HISTORY FROM: patient and daughter  REASON FOR VISIT: new consult   HISTORICAL  CHIEF COMPLAINT:  Chief Complaint  Patient presents with  . Transient Ischemic Attack  . Memory Loss  . Headache    HISTORY OF PRESENT ILLNESS:   73 year old female with heart disease, kidney disease, type 2 diabetes, schizoaffective disorder, history of TIAs, bladder problems, here for evaluation of multiple concerns by daughter: safety of coming off Plavix for epidural steroid injection, progressive cognitive decline, increasing headaches, throat pain, left-sided numbness. I previously saw patient one time while rounding on the stroke service. Now patient presenting for evaluation of these new complaints.  In summary patient was admitted from 10/16/13 until 10/18/13 due to sudden onset right-sided facial weakness and speech difficulty. Patient was admitted for TIA workup. MRI showed no acute stroke. Carotid ultrasound showed atypical waveform and left vertebral artery. On 10/18/13, I noted that there was asymmetry of blood pressure in the right versus left arm (Right arm BP 150/68; left arm BP 130/57). We considered CTA of the neck versus catheter angiogram, to look for subclavian stenosis, however this was deferred to outpatient basis as patient had renal insufficiency. Patient was discharged on Plavix with followup in stroke clinic.  Since that time patient followed up with vascular surgery who did not feel that carotid ultrasound was significantly abnormal. They recommended medical management.  Over past 3 weeks patient short-term memory problems have worsened. Patient has been having some intermittent memory problems for the past 2 years. Long-term memory is good. Patient also having more mood changes lately. She has not followed up with psychiatry lately.  Also patient struggling with chronic pain. She was  diagnosed with polymyalgia rheumatica 2 years ago and has been treated with steroids. Steroids have been tapered over last few months. She is scheduled to see rheumatology later this month.  Patient also was recommended to consider epidural steroid injection. However, this would require coming off of Plavix for 5 days before the injection. Patient's daughter was concerned about increased stroke risk during this time. Currently patient is not in significant pain. However she takes oral pain medications which help.  Patient having more frontal headaches lately.   REVIEW OF SYSTEMS: Full 14 system review of systems performed and notable only for memory loss confusion headache numbness insomnia snoring decreased energy joint pain aching muscles easy bruising easy bleeding freezing snoring diarrhea constipation urination problems itching hearing loss ringing in ears chest pain palpitations.  ALLERGIES: No Known Allergies  HOME MEDICATIONS: Outpatient Prescriptions Prior to Visit  Medication Sig Dispense Refill  . acetaminophen (TYLENOL) 500 MG tablet Take 1,000 mg by mouth every 6 (six) hours as needed (pain). 2 tablets every morning and then 6 hours as needed      . albuterol (PROVENTIL HFA;VENTOLIN HFA) 108 (90 BASE) MCG/ACT inhaler Inhale 2 puffs into the lungs every 6 (six) hours as needed for wheezing or shortness of breath.      Marland Kitchen alendronate (FOSAMAX) 70 MG tablet Take 1 tablet (70 mg total) by mouth every 7 (seven) days. Take with a full glass of water on an empty stomach.  4 tablet  5  . cetirizine (ZYRTEC) 10 MG tablet Take 10 mg by mouth at bedtime.       . clopidogrel (PLAVIX) 75 MG tablet Take 1 tablet (75 mg total) by mouth daily with breakfast.  30 tablet  0  .  clozapine (CLOZARIL) 200 MG tablet Take 300 mg by mouth at bedtime. Take 1.5 tablets (300 mg) at bedtime      . Cranberry (SM CRANBERRY) 300 MG tablet Take 300 mg by mouth at bedtime.       Marland Kitchen diltiazem (DILACOR XR) 180 MG 24 hr  capsule Take 180 mg by mouth daily.      Marland Kitchen docusate sodium (COLACE) 100 MG capsule Take 200 mg by mouth at bedtime.   10 capsule  0  . ferrous sulfate 325 (65 FE) MG EC tablet Take 325 mg by mouth 2 (two) times daily.        . Fluticasone-Salmeterol (ADVAIR) 250-50 MCG/DOSE AEPB Inhale 1 puff into the lungs at bedtime.      . isosorbide mononitrate (IMDUR) 60 MG 24 hr tablet Take 1 tablet (60 mg total) by mouth daily.  30 tablet  6  . levothyroxine (SYNTHROID, LEVOTHROID) 50 MCG tablet Take 50 mcg by mouth daily before breakfast.      . metoprolol succinate (TOPROL-XL) 100 MG 24 hr tablet Take 100 mg by mouth daily. Take with or immediately following a meal.      . Multiple Vitamin (MULTIVITAMIN WITH MINERALS) TABS Take 1 tablet by mouth at bedtime. Psychiatrist      . nitroGLYCERIN (NITROSTAT) 0.4 MG SL tablet Place 1 tablet (0.4 mg total) under the tongue every 5 (five) minutes as needed. For chest pain  25 tablet  3  . Omega-3 Fatty Acids (FISH OIL) 1200 MG CAPS Take 1-2 capsules by mouth at bedtime.       . pantoprazole (PROTONIX) 40 MG tablet Take 1 tablet (40 mg total) by mouth daily.  30 tablet  11  . Polyethyl Glycol-Propyl Glycol (SYSTANE OP) Apply 1 drop to eye 2 (two) times daily as needed (dry eyes).      . polyethylene glycol (MIRALAX / GLYCOLAX) packet Take 17 g by mouth daily as needed (constipation).      . pravastatin (PRAVACHOL) 20 MG tablet Take 20 mg by mouth at bedtime.      . predniSONE (DELTASONE) 1 MG tablet Take 7 mg by mouth daily with breakfast.       . Pseudoephedrine-Guaifenesin (GUAIFEN-PSE PO) Take 600 mg by mouth daily as needed (congestion).       . temazepam (RESTORIL) 15 MG capsule Take 15 mg by mouth at bedtime as needed.       . tiotropium (SPIRIVA) 18 MCG inhalation capsule Place 18 mcg into inhaler and inhale daily.      . traMADol (ULTRAM) 50 MG tablet Take 50-100 mg by mouth 2 (two) times daily as needed for severe pain.      . Vitamin D,  Ergocalciferol, (DRISDOL) 50000 UNITS CAPS capsule Take 1 capsule (50,000 Units total) by mouth every 7 (seven) days.  4 capsule  0   No facility-administered medications prior to visit.    PAST MEDICAL HISTORY: Past Medical History  Diagnosis Date  . Diabetes mellitus   . Frozen shoulder     right  . Obesity   . Renal insufficiency   . COPD (chronic obstructive pulmonary disease)   . Shortness of breath   . Stroke   . CAD (coronary artery disease) 2006    Taxus stent of mid RCA. negative myoview 2014 and normal LV function  . Diastolic heart failure, NYHA class 1   . Polymyalgia rheumatica     PAST SURGICAL HISTORY: Past Surgical History  Procedure  Laterality Date  . Partial hysterectomy  1995  . Abdominal hysterectomy    . Cholecystectomy    . Coronary angioplasty with stent placement  2006    Taxus DES to RCA, 50 % residual    FAMILY HISTORY: Family History  Problem Relation Age of Onset  . Diabetes Mother   . Heart disease Mother   . Heart disease Father   . Arthritis Father   . Healthy Sister   . Heart disease Brother   . Osteoporosis Sister   . Heart disease Brother   . Diabetes Brother   . Heart disease Brother   . Diabetes Brother   . Diabetes Brother   . Healthy Brother   . Healthy Brother   . Healthy Brother     SOCIAL HISTORY:  History   Social History  . Marital Status: Divorced    Spouse Name: N/A    Number of Children: 3  . Years of Education: 9th   Occupational History  . homemaker    Social History Main Topics  . Smoking status: Former Smoker -- 1.00 packs/day for 35 years    Quit date: 04/14/2004  . Smokeless tobacco: Not on file  . Alcohol Use: No  . Drug Use: No  . Sexual Activity: Not on file   Other Topics Concern  . Not on file   Social History Narrative   Daughter [name] and son are very involved.  [name] comes to most office visits.   Recent resident at [facility]; daughter unhappy with care and taking her to  live at home, after Jan 30th. Daughter is HCPOA, not averse to SNF/rehab in short-term, but does not wish pt to return to [facility] and requests that pt/pt's care not be discussed by [facility] providers to [facility], moving forward.   Caffeine Use: 1-3 cups daily     PHYSICAL EXAM  Filed Vitals:   11/26/13 1150  BP: 110/63  Pulse: 68  Temp: 97.8 F (36.6 C)  TempSrc: Oral  Height: 5' 3"  (1.6 m)  Weight: 166 lb (75.297 kg)    Not recorded    Body mass index is 29.41 kg/(m^2).  GENERAL EXAM: Patient is in no distress; well developed, nourished and groomed; neck is supple  CARDIOVASCULAR: Regular rate and rhythm, no murmurs, no carotid bruits; SUBTLE LEFT SUBCLAVIAN BRUIT; BILATERAL RADIAL PULSES SYMM  NEUROLOGIC: MENTAL STATUS: PLEASANT, CALM; SOMEWHAT TANGENTIAL; awake, alert, oriented to person, place and time, recent and remote memory intact, normal attention and concentration, language fluent, comprehension intact, naming intact, fund of knowledge appropriate CRANIAL NERVE: no papilledema on fundoscopic exam, pupils equal and reactive to light, visual fields full to confrontation, extraocular muscles intact, no nystagmus, facial sensation and strength symmetric, HEARING DECREASED, palate elevates symmetrically, uvula midline, shoulder shrug symmetric, tongue midline. MOTOR: normal bulk and tone, full strength in the BUE, BLE SENSORY: normal and symmetric to light touch, pinprick, temperature; VIB < 5 SEC AT TOES COORDINATION: finger-nose-finger, fine finger movements normal REFLEXES: BUE 1, KNEES TRACE, ANKLES ABSENT GAIT/STATION: narrow based gait; SLOW AND CAUTIOUS    DIAGNOSTIC DATA (LABS, IMAGING, TESTING) - I reviewed patient records, labs, notes, testing and imaging myself where available.  Lab Results  Component Value Date   WBC 14.7* 10/16/2013   HGB 9.9* 10/16/2013   HCT 29.0* 10/16/2013   MCV 91.8 10/16/2013   PLT 185 10/16/2013      Component Value  Date/Time   NA 131* 10/16/2013 1553   NA 137 06/21/2012  K 4.5 10/16/2013 1553   CL 93* 10/16/2013 1553   CO2 24 10/16/2013 1553   GLUCOSE 131* 10/16/2013 1553   BUN 37* 10/16/2013 1553   BUN 33* 06/21/2012   CREATININE 1.68* 10/16/2013 2027   CREATININE 1.85* 07/16/2013 1450   CREATININE 1.6* 06/21/2012   CALCIUM 10.0 10/16/2013 1553   CALCIUM 10.0 03/21/2008 2229   PROT 6.5 10/16/2013 1553   ALBUMIN 3.2* 10/16/2013 1553   AST 15 10/16/2013 1553   ALT 19 10/16/2013 1553   ALKPHOS 39 10/16/2013 1553   BILITOT 0.3 10/16/2013 1553   GFRNONAA 29* 10/16/2013 2027   GFRAA 33* 10/16/2013 2027   Lab Results  Component Value Date   CHOL 122 10/17/2013   [name] 65 10/17/2013   [name] 41 10/17/2013   LDLDIRECT 88 09/05/2007   [name] 80 10/17/2013   [name] 1.9 10/17/2013   Lab Results  Component Value Date   HGBA1C 5.7* 10/17/2013   Lab Results  Component Value Date   KWIOXBDZ32 992 06/28/2013   Lab Results  Component Value Date   TSH 3.144 02/02/2013    10/16/13 MRI brain / MRA head: 1. No evidence of acute intracranial abnormality.  2. Mild to moderate chronic small vessel ischemic disease and cerebral atrophy.  3. No evidence of major intracranial arterial occlusion or significant stenosis.  10/17/13 Carotid u/s: Brachial pressures:                RightLeft +---------+-----+----+ Systolic 426 834  +---------+-----+----+ Diastolic66   78    +---------+-----+----+ - 1-39% internal carotid artery stenosis bilaterally. The right vertebral artery is patent with antegrade flow. The left vertebral artery is patent with antegrade flow, however the waveform is mildly atypical. This along with the 31mHg gradient between brachial arteries is suggestive of a possible developing proximal obstruction.   11/26/13 TTE:  - Left ventricle: The cavity size was normal. There was moderate concentric hypertrophy. Systolic function was normal. The estimated ejection fraction was in the range of 60% to 65%. Wall motion was normal; there  were no regional wall motion abnormalities. There was an increased relative contribution of atrial contraction to ventricular filling. Doppler parameters are consistent with abnormal left ventricular relaxation (grade 1 diastolic dysfunction). - Aortic valve: Trivial regurgitation.   ASSESSMENT AND PLAN  73y.o. year old female here with type 2 diabetes, heart disease, kidney disease, schizoaffective disorder, TIAs, here for evaluation of numerous new issues:  1. Regarding consideration of epidural steroid injection and coming off of Plavix, I explained that there may be a slight increase risk for TIA or stroke during this period off of antiplatelet. However this has to be weighed against the risks and benefits of the epidural steroid injection in the context of pain control. If patient's pain can be reasonably controlled with oral pain medications and physical therapy this may be a better way to approach the situation. They will think about this and make a decision. If they do go ahead with epidural steroid injection it would be reasonable to limit time off of Plavix to a minimum (such as 5 days or less). After the procedure should be restarted as his possible.  2. Regarding history of polymyalgia rheumatica, there may be an increased risk for temporal arteritis. This could be related to patient's worsening headaches. Will check ESR and CRP for evaluation. Agree with rheumatology evaluation later this month. Patient may need to increase prednisone depending on this evaluation.   3. Regarding patient's memory problems, this is  likely related to her underlying chronic pain, chronic psychiatric issues, sleep disorder. MMSE 28/30. No frontal release signs. MRI brain does not show any significant abnormality to relate to the memory issues. Will check B12 and TSH.  4. Regarding left vertebral artery abnormality on prior carotid ultrasound, this may be artifactual. I will repeat carotid ultrasound for  evaluation. Today right and left brachial blood pressures are symmetric.  PLAN: Orders Placed This Encounter  Procedures  . US Carotid Bilateral  . Sedimentation Rate  . C-reactive Protein  . Vitamin B12  . TSH   Return in about 3 months (around 02/26/2014).    Penni Bombard, MD 0/12/1217, 7:58 PM Certified in Neurology, Neurophysiology and Neuroimaging  [facility] [address] [place], [place] [address] [phone]

## 2013-11-26 NOTE — Patient Instructions (Signed)
Follow up with primary care, psychiatry, rheumatology.  I will check carotid ultrasound follow up study.

## 2013-11-27 LAB — TSH: TSH: 1.49 u[IU]/mL (ref 0.450–4.500)

## 2013-11-27 LAB — C-REACTIVE PROTEIN: CRP: 6.4 mg/L — AB (ref 0.0–4.9)

## 2013-11-27 LAB — SEDIMENTATION RATE: Sed Rate: 13 mm/hr (ref 0–40)

## 2013-11-27 LAB — VITAMIN B12: VITAMIN B 12: 1109 pg/mL — AB (ref 211–946)

## 2013-11-27 NOTE — Progress Notes (Signed)
Quick Note:  I called and spoke to [facility], daughter that labs ok, CRP slightly high, but ESR normal. She verbalized understanding. She asked to let Dr. Leta Baptist know that she has call into psych for earlier appt than 03/2014. ______

## 2013-11-29 DIAGNOSIS — Z1212 Encounter for screening for malignant neoplasm of rectum: Secondary | ICD-10-CM | POA: Diagnosis not present

## 2013-12-05 ENCOUNTER — Other Ambulatory Visit: Payer: Medicare Other

## 2013-12-05 DIAGNOSIS — M545 Low back pain, unspecified: Secondary | ICD-10-CM | POA: Diagnosis not present

## 2013-12-07 DIAGNOSIS — Z79899 Other long term (current) drug therapy: Secondary | ICD-10-CM | POA: Diagnosis not present

## 2013-12-11 ENCOUNTER — Ambulatory Visit (INDEPENDENT_AMBULATORY_CARE_PROVIDER_SITE_OTHER): Payer: Medicare Other | Admitting: Internal Medicine

## 2013-12-11 ENCOUNTER — Encounter: Payer: Self-pay | Admitting: Internal Medicine

## 2013-12-11 VITALS — BP 150/78 | HR 74 | Ht 63.0 in | Wt 166.1 lb

## 2013-12-11 DIAGNOSIS — I2510 Atherosclerotic heart disease of native coronary artery without angina pectoris: Secondary | ICD-10-CM | POA: Diagnosis not present

## 2013-12-11 DIAGNOSIS — E7800 Pure hypercholesterolemia, unspecified: Secondary | ICD-10-CM

## 2013-12-11 DIAGNOSIS — I5032 Chronic diastolic (congestive) heart failure: Secondary | ICD-10-CM

## 2013-12-11 DIAGNOSIS — G458 Other transient cerebral ischemic attacks and related syndromes: Secondary | ICD-10-CM | POA: Diagnosis not present

## 2013-12-11 DIAGNOSIS — I10 Essential (primary) hypertension: Secondary | ICD-10-CM | POA: Diagnosis not present

## 2013-12-11 DIAGNOSIS — I509 Heart failure, unspecified: Secondary | ICD-10-CM

## 2013-12-11 NOTE — Progress Notes (Signed)
12/11/2013   PCP: [name], MD   Chief Complaint  Patient presents with  . Follow-up    2 month visit - achiness in chest, episode over wkend (broke out in sweat with pain in shoulder blades and bilateral jaw pain); every once in a while feels like breath gets taken away; reports lightheadedness; some facial/eye edema last night; hospital in May (TIA), saw Dr. Donnetta Hutching; saw neurologist (daughter noticed some neuro changes - saw Dr. Leta Baptist ordered labs and carotid doppler); had spinal injection about 1 week ago    Primary Cardiologist:Previously Dr. Rollene Fare now Dr. Debara Pickett  HPI:  [name] presents today with her daughter after developing chest pain, Rt sided yesterday.  Pt had walked- her usual exercise in the morning, later while watching TV she developed Rt side chest pain, the pain then moved to mid sternal area then the rt arm.  Her throat felt strange also.  She had mild nausea.  Her daughter called our office and we instructed her to take NTG sl.  The patient was too afraid.  She took a lozenger that helped her throat.  Pain resolved within 15 min.    She has hx. Of CAD with cath in 2006 and rec'd Taxus DES of the mid RCA.  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  Last lexiscan myoview 09/2012 was low risk scan.  Echo 07/2012 EF 55-60%, mild hypokinesis of inf wall,  Grade 1 diastolic dysfunction.  Other hx as below.  With her negative nuc her Imdur had been d/c'd.  She developed chest pain and it was restarted at 30 mg.  Previously she had been on 60 mg.  At her last office visit, [name] started her back on Imdur 60 mg daily.  She now reports her chest pain has resolved.  Her main complaint at this time include ankle pain and some low back pain. She denies any recurrent chest pain. She had recent laboratory work performed at her primary care doctor's office this past week which shows moderate chronic kidney disease with a GFR of 31. No liver enzyme abnormalities. Well  controlled cholesterol at total cholesterol 141, triglycerides 114, [name] 51 LDL 67. Hemoglobin A1c 5.5, TSH 1.7. ApoB is 58.  Rebecca [name] follows up today from recent hospitalization where she possibly had a mild TIA. It is not clear however her aspirin was switched over to Plavix. She's not having any current cardiac issues.   No Known Allergies  Current Outpatient Prescriptions  Medication Sig Dispense Refill  . acetaminophen (TYLENOL) 500 MG tablet Take 1,000 mg by mouth every 6 (six) hours as needed (pain). 2 tablets every morning and then 6 hours as needed      . albuterol (PROVENTIL HFA;VENTOLIN HFA) 108 (90 BASE) MCG/ACT inhaler Inhale 2 puffs into the lungs every 6 (six) hours as needed for wheezing or shortness of breath.      Marland Kitchen alendronate (FOSAMAX) 70 MG tablet Take 1 tablet (70 mg total) by mouth every 7 (seven) days. Take with a full glass of water on an empty stomach.  4 tablet  5  . cetirizine (ZYRTEC) 10 MG tablet Take 10 mg by mouth at bedtime.       . clopidogrel (PLAVIX) 75 MG tablet Take 1 tablet (75 mg total) by mouth daily with breakfast.  30 tablet  0  . clozapine (CLOZARIL) 200 MG tablet Take 300 mg by mouth at bedtime. Take 1.5 tablets (300 mg) at bedtime      .  Cranberry (SM CRANBERRY) 300 MG tablet Take 300 mg by mouth at bedtime.       Marland Kitchen diltiazem (DILACOR XR) 180 MG 24 hr capsule Take 180 mg by mouth daily.      Marland Kitchen docusate sodium (COLACE) 100 MG capsule Take 200 mg by mouth at bedtime.   10 capsule  0  . ferrous sulfate 325 (65 FE) MG EC tablet Take 325 mg by mouth 2 (two) times daily.        . Fluticasone-Salmeterol (ADVAIR) 250-50 MCG/DOSE AEPB Inhale 1 puff into the lungs at bedtime.      . isosorbide mononitrate (IMDUR) 60 MG 24 hr tablet Take 1 tablet (60 mg total) by mouth daily.  30 tablet  6  . levothyroxine (SYNTHROID, LEVOTHROID) 50 MCG tablet Take 50 mcg by mouth daily before breakfast.      . metoprolol succinate (TOPROL-XL) 100 MG 24 hr tablet Take 100 mg by  mouth daily. Take with or immediately following a meal.      . Multiple Vitamin (MULTIVITAMIN WITH MINERALS) TABS Take 1 tablet by mouth at bedtime. Psychiatrist      . nitroGLYCERIN (NITROSTAT) 0.4 MG SL tablet Place 1 tablet (0.4 mg total) under the tongue every 5 (five) minutes as needed. For chest pain  25 tablet  3  . Omega-3 Fatty Acids (FISH OIL) 1200 MG CAPS Take 1-2 capsules by mouth at bedtime.       . pantoprazole (PROTONIX) 40 MG tablet Take 1 tablet (40 mg total) by mouth daily.  30 tablet  11  . Polyethyl Glycol-Propyl Glycol (SYSTANE OP) Apply 1 drop to eye 2 (two) times daily as needed (dry eyes).      . polyethylene glycol (MIRALAX / GLYCOLAX) packet Take 17 g by mouth daily as needed (constipation).      . pravastatin (PRAVACHOL) 20 MG tablet Take 20 mg by mouth at bedtime.      . predniSONE (DELTASONE) 1 MG tablet Take 7 mg by mouth daily with breakfast.       . Pseudoephedrine-Guaifenesin (GUAIFEN-PSE PO) Take 600 mg by mouth daily as needed (congestion).       . tamsulosin (FLOMAX) 0.4 MG CAPS capsule Take 0.4 mg by mouth.      . temazepam (RESTORIL) 15 MG capsule Take 15 mg by mouth at bedtime as needed.       . tiotropium (SPIRIVA) 18 MCG inhalation capsule Place 18 mcg into inhaler and inhale daily.      . traMADol (ULTRAM) 50 MG tablet Take 50-100 mg by mouth 2 (two) times daily as needed for severe pain.      . Vitamin D, Ergocalciferol, (DRISDOL) 50000 UNITS CAPS capsule Take 1 capsule (50,000 Units total) by mouth every 7 (seven) days.  4 capsule  0   No current facility-administered medications for this visit.    Past Medical History  Diagnosis Date  . Diabetes mellitus   . Frozen shoulder     right  . Obesity   . Renal insufficiency   . COPD (chronic obstructive pulmonary disease)   . Shortness of breath   . Stroke   . CAD (coronary artery disease) 2006    Taxus stent of mid RCA. negative myoview 2014 and normal LV function  . Diastolic heart failure,  NYHA class 1   . Polymyalgia rheumatica     Past Surgical History  Procedure Laterality Date  . Partial hysterectomy  1995  . Abdominal hysterectomy    .  Cholecystectomy    . Coronary angioplasty with stent placement  2006    Taxus DES to RCA, 50 % residual    SFK:CLEXNTZ:GY colds or fevers, no weight changes Skin:no rashes or ulcers HEENT:no blurred vision, no congestion CV:see HPI PUL:see HPI GI:no diarrhea constipation or melena, no indigestion GU:no hematuria, no dysuria MS:+ joint pain, no claudication Neuro:no syncope, no lightheadedness Endo:+ diabetes, + thyroid disease  PHYSICAL EXAM BP 150/78  Pulse 74  Ht 5\' 3"  (1.6 m)  Wt 166 lb 1.6 oz (75.342 kg)  BMI 29.43 kg/m2 GEN: Awake, NAD HEENT: PERRLA, EOMI LUNGS: CLEAR ABD: S/NT, +BS EXT: No edema NEURO: Grossly intact PSYCH: Normal mood, affect  EKG: NSR at 74  ASSESSMENT AND PLAN Patient Active Problem List   Diagnosis Date Noted  . Memory loss 11/26/2013  . TIA (transient ischemic attack) 10/16/2013  . COPD exacerbation 07/16/2013  . Snoring 06/29/2013  . Diabetes mellitus, type 2 06/29/2013  . Chest pain at rest 03/14/2013  . Estrogen deficiency 02/06/2013  . Bruising 01/02/2013  . Osteoporosis screening 01/02/2013  . Breast cancer screening 01/02/2013  . Personal history of fall 10/19/2012  . Polymyalgia rheumatica 10/03/2012  . Weakness 09/27/2012  . UTI (urinary tract infection) 09/19/2012  . Trochanteric bursitis of right hip 04/11/2012  . At high risk for falls 02/28/2012  . Lower urinary tract symptoms 01/27/2012  . Chronic constipation 01/26/2012  . Memory impairment 12/01/2010  . History of small bowel obstruction 11/04/2010  . COPD, MILD 07/09/2010  . 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 CONSTIPATION, CHRONIC 12/12/2008  . PRURITUS 03/06/2008  . SCHIZOAFFECTIVE DISORDER 01/10/2008  . GOUT 09/11/2007  . LOW BACK PAIN, CHRONIC 07/05/2007  . HYPERCHOLESTEROLEMIA  01/25/2007  . HYPOTHYROIDISM, UNSPECIFIED 08/11/2006  . OBESITY, NOS 08/11/2006  . ANEMIA, NORMOCYTIC, CHRONIC 08/11/2006  . HYPERTENSION, BENIGN SYSTEMIC 08/11/2006  . CAD 08/11/2006  . HEMORRHOIDS, NOS 08/11/2006  . GASTROESOPHAGEAL REFLUX, NO ESOPHAGITIS 08/11/2006  . RENAL INSUFFICIENCY, CHRONIC 08/11/2006  . ARTHRITIS 08/11/2006   PLAN: 1.  Ms. Linarez is doing well. She had a brief episode of some possible left facial droop and may have had a TIA. She is currently on Plavix which is causing her more bruising due to ongoing steroid use for her polymyalgia rheumatica. There are no active cardiac complaints. Plan to see her back in 6 months.  Pixie Casino, MD, [facility] Attending Cardiologist [place]

## 2013-12-11 NOTE — Patient Instructions (Signed)
Your physician wants you to follow-up in:  6 months. You will receive a reminder letter in the mail two months in advance. If you don't receive a letter, please call our office to schedule the follow-up appointment.   

## 2013-12-11 NOTE — Telephone Encounter (Signed)
Not taking new patient medicare  Practice too full  Also we dont take [place] access

## 2013-12-17 ENCOUNTER — Telehealth: Payer: Self-pay | Admitting: Internal Medicine

## 2013-12-17 NOTE — Telephone Encounter (Signed)
Patient episode over weekend and again this morning of rapid heart rate and pounding in her ears.

## 2013-12-17 NOTE — Telephone Encounter (Signed)
Daughter states that she has not heard from anyone yet regarding her call this morning.  States that her mother is not feeling well----does she need to come in or go to the ER?

## 2013-12-17 NOTE — Telephone Encounter (Signed)
Has had a couple episodes of heart racing with pounding in her ears lasting maybe a minute, several times over the weekend.  She does drink coffee - recommended she switch to decaf - daughter voiced understanding.  Instructed to check heart rate if she has another episode and let us know the rate and also if it is irregular.  Voiced understanding.

## 2013-12-18 NOTE — Telephone Encounter (Signed)
Unable to leave a msg mailbox full

## 2013-12-19 ENCOUNTER — Ambulatory Visit (INDEPENDENT_AMBULATORY_CARE_PROVIDER_SITE_OTHER): Payer: Medicare Other

## 2013-12-19 DIAGNOSIS — R51 Headache: Secondary | ICD-10-CM

## 2013-12-19 DIAGNOSIS — M25569 Pain in unspecified knee: Secondary | ICD-10-CM

## 2013-12-19 DIAGNOSIS — R413 Other amnesia: Secondary | ICD-10-CM

## 2013-12-19 DIAGNOSIS — M353 Polymyalgia rheumatica: Secondary | ICD-10-CM

## 2013-12-19 DIAGNOSIS — G459 Transient cerebral ischemic attack, unspecified: Secondary | ICD-10-CM | POA: Diagnosis not present

## 2013-12-19 DIAGNOSIS — G894 Chronic pain syndrome: Secondary | ICD-10-CM

## 2013-12-19 DIAGNOSIS — R070 Pain in throat: Secondary | ICD-10-CM

## 2013-12-19 DIAGNOSIS — M549 Dorsalgia, unspecified: Secondary | ICD-10-CM

## 2013-12-20 DIAGNOSIS — M5137 Other intervertebral disc degeneration, lumbosacral region: Secondary | ICD-10-CM | POA: Diagnosis not present

## 2013-12-24 DIAGNOSIS — F200 Paranoid schizophrenia: Secondary | ICD-10-CM | POA: Diagnosis not present

## 2013-12-28 IMAGING — CR DG THORACIC SPINE 2V
2 series · 2 of 2 positions shown · non-contrast
Comparison: Chest x-ray 09/03/2009

CLINICAL DATA: Back pain

THORACIC SPINE - 2 VIEW

[t t-spine a.p.]
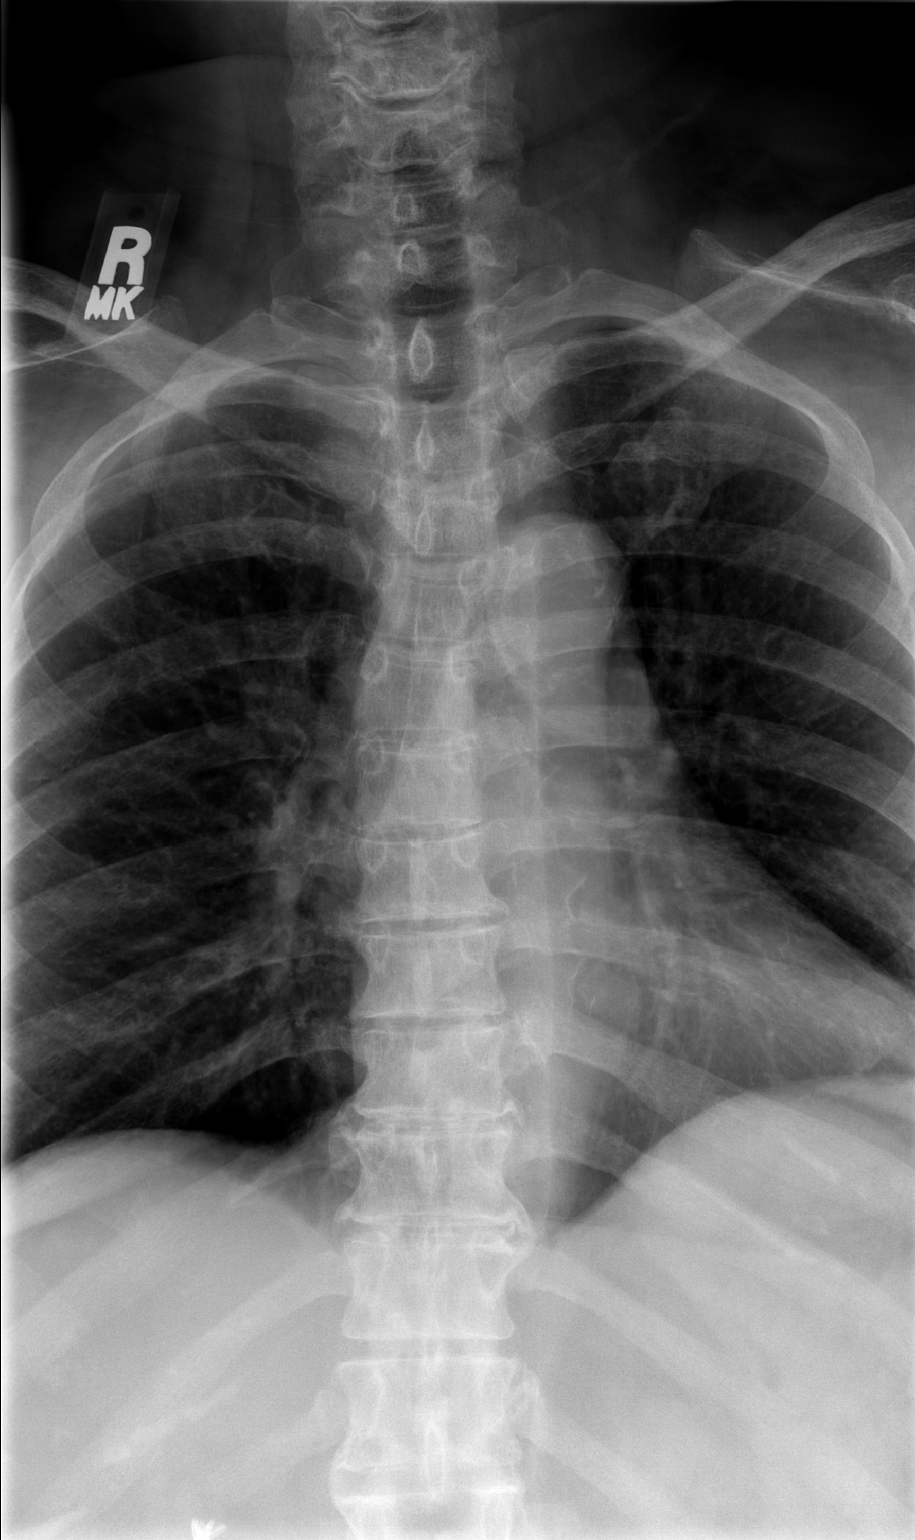

[t t-spine lat]
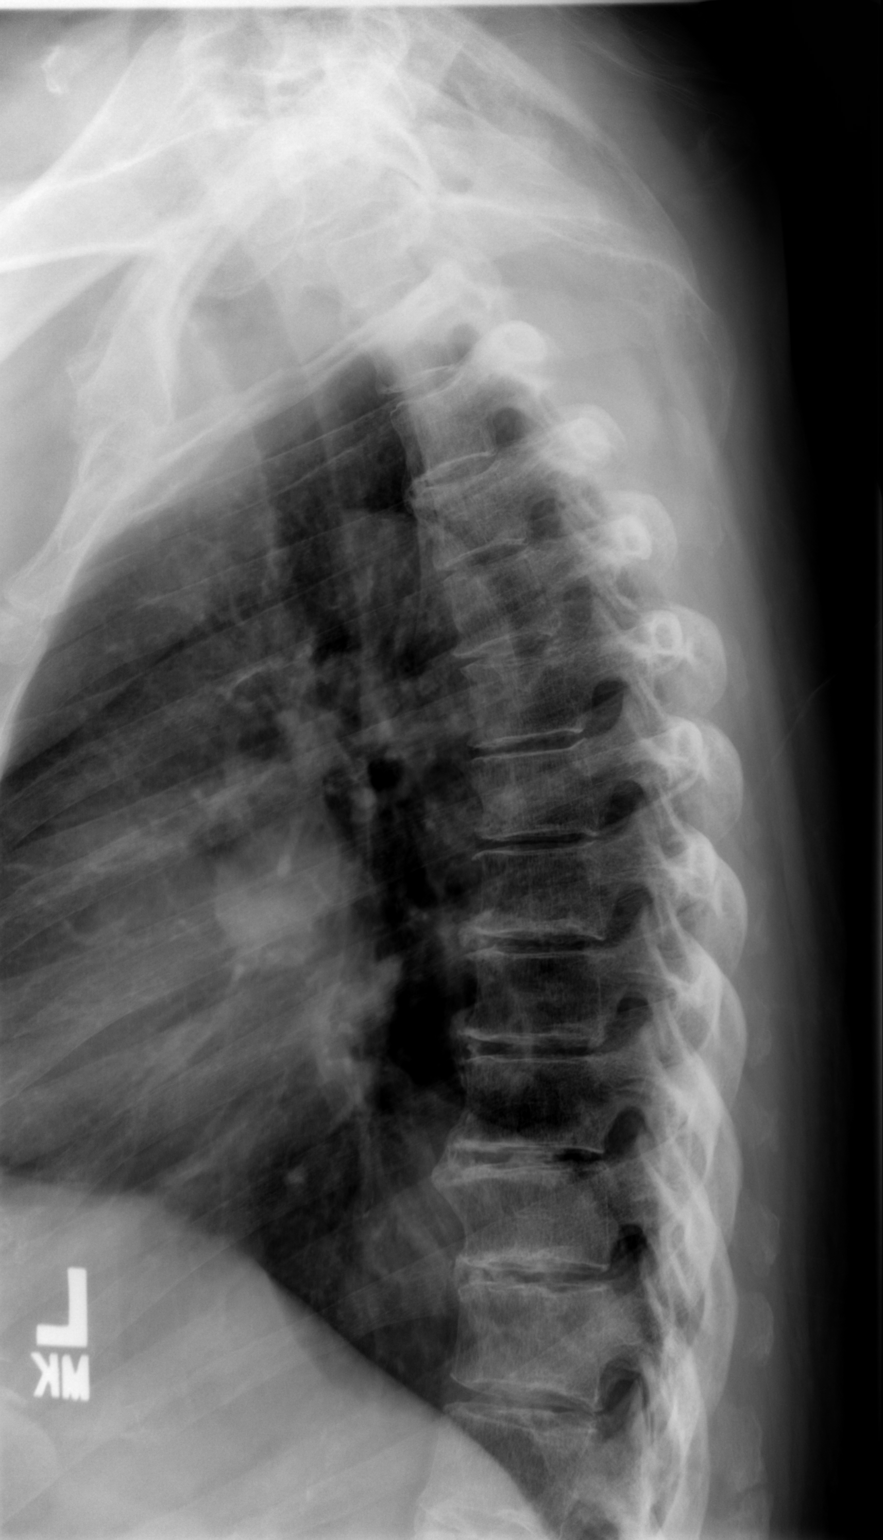

[2 of 2 positions shown; findings below may reference images not displayed]

FINDINGS: Negative for fracture or mass.  Disc degeneration and
spurring in the mid to lower thoracic spine.  No focal bony
abnormality.  Minimal scoliosis.
IMPRESSION: Mild thoracic degenerative change.  No acute bony abnormality.

## 2013-12-28 IMAGING — CR DG LUMBAR SPINE COMPLETE 4+V
5 series · 5 of 5 positions shown · non-contrast
Comparison: None.

CLINICAL DATA: Chronic back pain

LUMBAR SPINE - COMPLETE 4+ VIEW

[t l-spine a.p.]
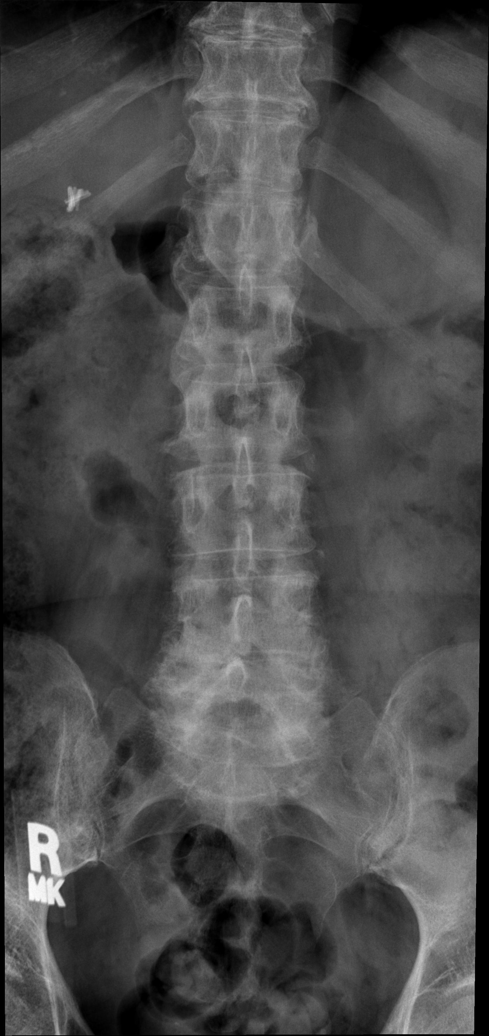

[t l-spine oblique exposure (1 of 2)]
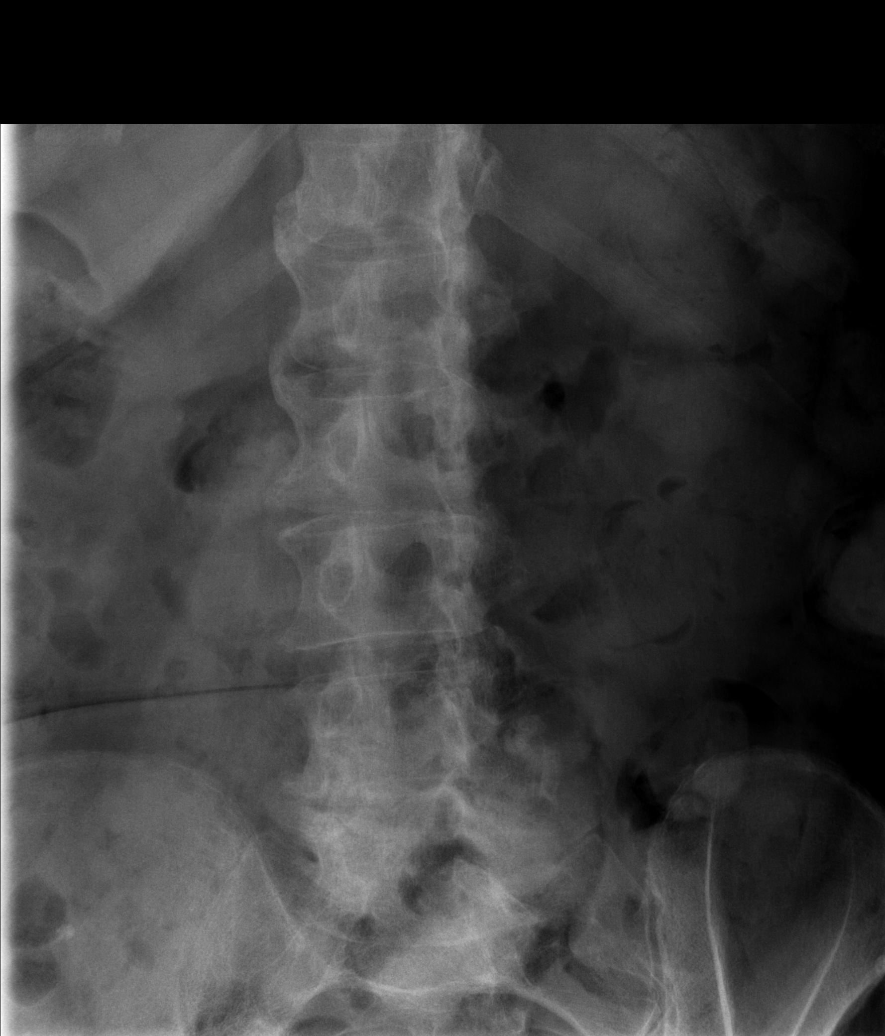

[t l-spine oblique exposure (2 of 2)]
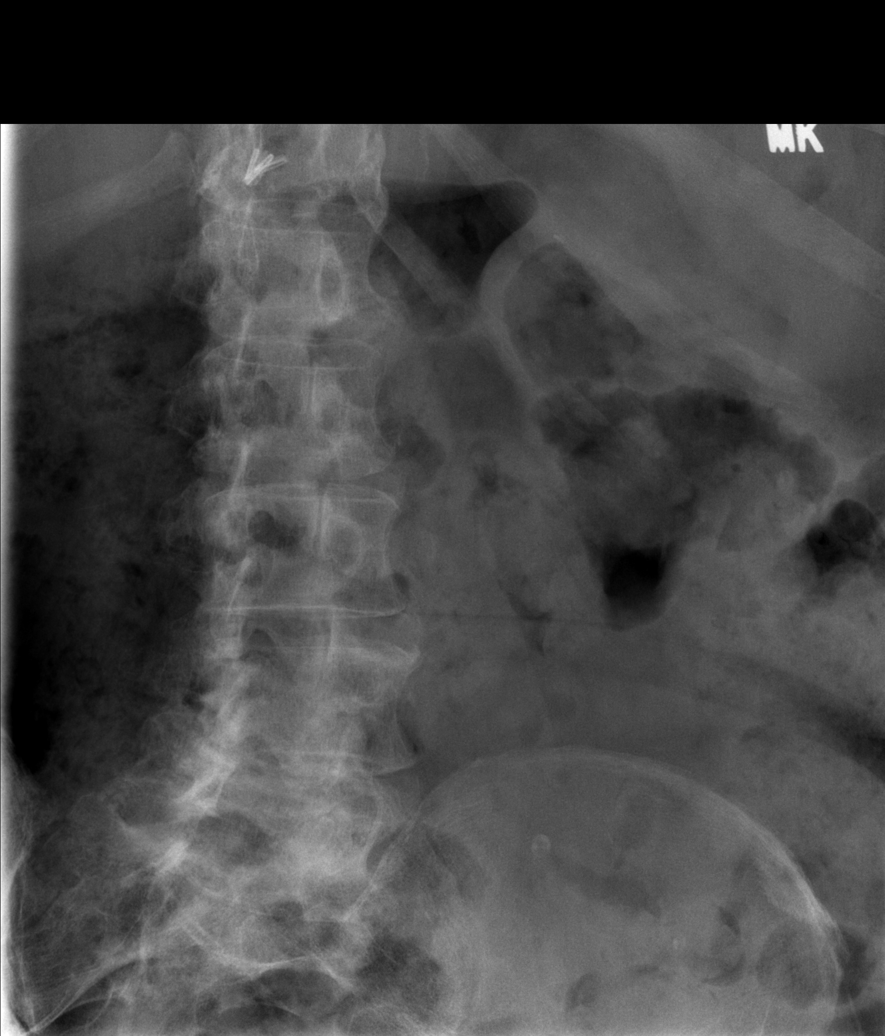

[t l-spine lat]
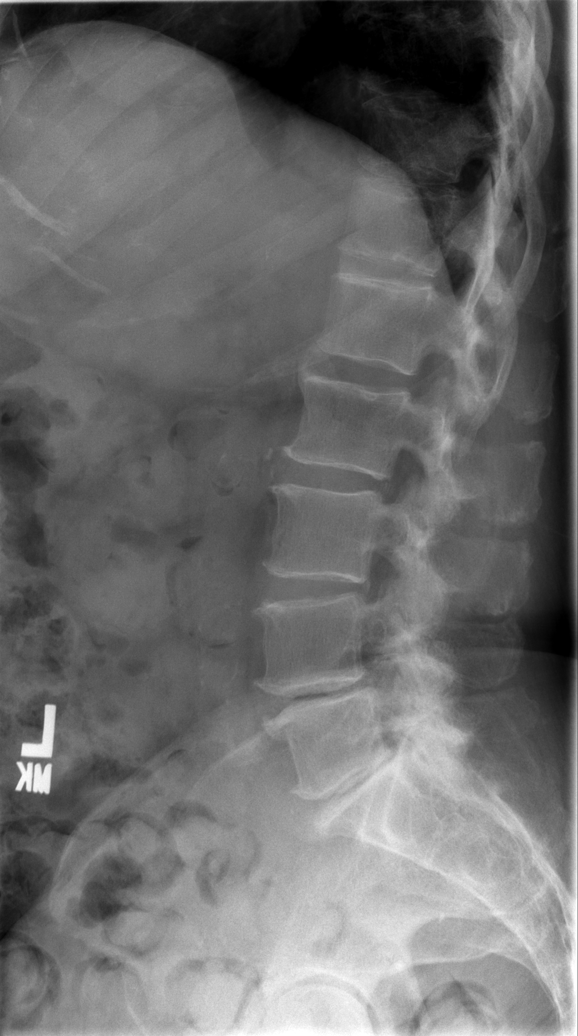

[t l-spine l5-s1 spot]
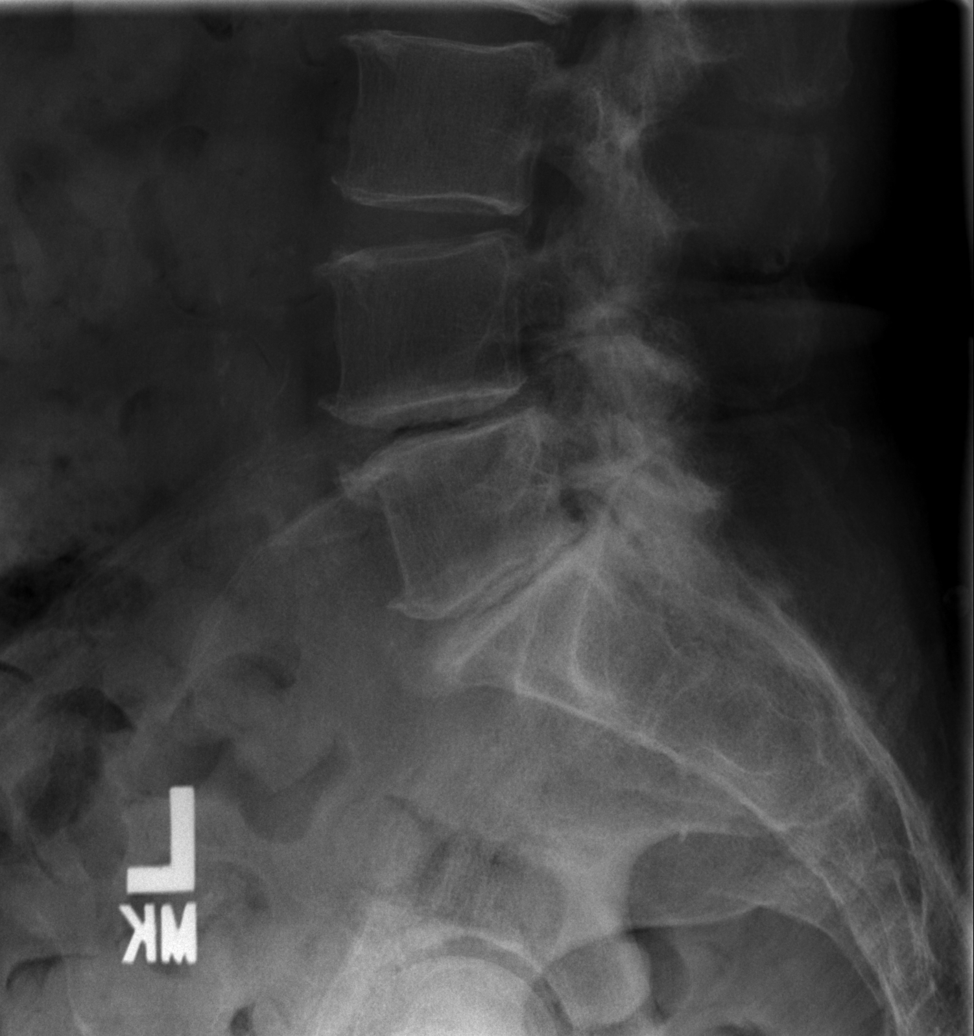

[5 of 5 positions shown; findings below may reference images not displayed]

FINDINGS: Mild anterior slip of L4-5.  Moderate disc degeneration
and spurring L4-5 and L5-S1.  Mild disc degeneration L2-3 and L3-4.

Negative for fracture.  Facet degeneration at L5-S1.  Negative for
pars defect.  Surgical clips in the gallbladder fossa.
IMPRESSION: Lumbar degenerative changes, most prominent at L4-5 and L5-S1.  No
acute bony abnormality.

## 2013-12-31 DIAGNOSIS — N390 Urinary tract infection, site not specified: Secondary | ICD-10-CM | POA: Diagnosis not present

## 2014-01-07 DIAGNOSIS — M810 Age-related osteoporosis without current pathological fracture: Secondary | ICD-10-CM | POA: Diagnosis not present

## 2014-01-07 DIAGNOSIS — M353 Polymyalgia rheumatica: Secondary | ICD-10-CM | POA: Diagnosis not present

## 2014-01-07 DIAGNOSIS — Z79899 Other long term (current) drug therapy: Secondary | ICD-10-CM | POA: Diagnosis not present

## 2014-01-07 DIAGNOSIS — M159 Polyosteoarthritis, unspecified: Secondary | ICD-10-CM | POA: Diagnosis not present

## 2014-01-08 ENCOUNTER — Encounter: Payer: Self-pay | Admitting: Internal Medicine

## 2014-01-09 DIAGNOSIS — Z6828 Body mass index (BMI) 28.0-28.9, adult: Secondary | ICD-10-CM | POA: Diagnosis not present

## 2014-01-09 DIAGNOSIS — J029 Acute pharyngitis, unspecified: Secondary | ICD-10-CM | POA: Diagnosis not present

## 2014-01-09 DIAGNOSIS — J449 Chronic obstructive pulmonary disease, unspecified: Secondary | ICD-10-CM | POA: Diagnosis not present

## 2014-01-17 ENCOUNTER — Telehealth: Payer: Self-pay | Admitting: Hematology and Oncology

## 2014-01-17 NOTE — Telephone Encounter (Signed)
S/W PATIENT DTR AMANDA AND GAVE NP APPT FOR 08/17 @ 10:45 W/DR. [facility].  REFERRING DR. Nicki Reaper [facility]OL[facility] DX-MULTI MYELOMA

## 2014-01-21 ENCOUNTER — Ambulatory Visit (INDEPENDENT_AMBULATORY_CARE_PROVIDER_SITE_OTHER): Payer: Medicare Other | Admitting: Nurse Practitioner

## 2014-01-21 ENCOUNTER — Encounter: Payer: Self-pay | Admitting: Nurse Practitioner

## 2014-01-21 VITALS — BP 118/63 | HR 80 | Ht 63.0 in | Wt 162.4 lb

## 2014-01-21 DIAGNOSIS — G459 Transient cerebral ischemic attack, unspecified: Secondary | ICD-10-CM

## 2014-01-21 DIAGNOSIS — I2510 Atherosclerotic heart disease of native coronary artery without angina pectoris: Secondary | ICD-10-CM | POA: Diagnosis not present

## 2014-01-21 NOTE — Patient Instructions (Signed)
Continue clopidogrel 75 mg orally every day  for secondary stroke prevention and maintain strict control of hypertension with blood pressure goal below 140/90, diabetes with hemoglobin A1c goal below 7% and lipids with LDL cholesterol goal below 100 mg/dL. Followup in the future with Dr. Leta [name] as scheduled, on 03/05/14.  We will plan repeat carotid doppler study in 6 months, either here or at Dr. Luther Parody office.  Transient Ischemic Attack A transient ischemic attack (TIA) is a "warning stroke" that causes stroke-like symptoms. Unlike a stroke, a TIA does not cause permanent damage to the brain. The symptoms of a TIA can happen very fast and do not last long. It is important to know the symptoms of a TIA and what to do. This can help prevent a major stroke or death. CAUSES   A TIA is caused by a temporary blockage in an artery in the brain or neck (carotid artery). The blockage does not allow the brain to get the blood supply it needs and can cause different symptoms. The blockage can be caused by either:  A blood clot.  Fatty buildup (plaque) in a neck or brain artery. RISK FACTORS  High blood pressure (hypertension).  High cholesterol.  Diabetes mellitus.  Heart disease.  The build up of plaque in the blood vessels (peripheral artery disease or atherosclerosis).  The build up of plaque in the blood vessels providing blood and oxygen to the brain (carotid artery stenosis).  An abnormal heart rhythm (atrial fibrillation).  Obesity.  Smoking.  Taking oral contraceptives (especially in combination with smoking).  Physical inactivity.  A diet high in fats, salt (sodium), and calories.  Alcohol use.  Use of illegal drugs (especially cocaine and methamphetamine).  Being female.  Being African American.  Being over the age of 30.  Family history of stroke.  Previous history of blood clots, stroke, TIA, or heart attack.  Sickle cell disease. SYMPTOMS  TIA symptoms are  the same as a stroke but are temporary. These symptoms usually develop suddenly, or may be newly present upon awakening from sleep:  Sudden weakness or numbness of the face, arm, or leg, especially on one side of the body.  Sudden trouble walking or difficulty moving arms or legs.  Sudden confusion.  Sudden personality changes.  Trouble speaking (aphasia) or understanding.  Difficulty swallowing.  Sudden trouble seeing in one or both eyes.  Double vision.  Dizziness.  Loss of balance or coordination.  Sudden severe headache with no known cause.  Trouble reading or writing.  Loss of bowel or bladder control.  Loss of consciousness. DIAGNOSIS  Your caregiver may be able to determine the presence or absence of a TIA based on your symptoms, history, and physical exam. Computed tomography (CT scan) of the brain is usually performed to help identify a TIA. Other tests may be done to diagnose a TIA. These tests may include:  Electrocardiography.  Continuous heart monitoring.  Echocardiography.  Carotid ultrasonography.  Magnetic resonance imaging (MRI).  A scan of the brain circulation.  Blood tests. PREVENTION  The risk of a TIA can be decreased by appropriately treating high blood pressure, high cholesterol, diabetes, heart disease, and obesity and by quitting smoking, limiting alcohol, and staying physically active. TREATMENT  Time is of the essence. Since the symptoms of TIA are the same as a stroke, it is important to seek treatment as soon as possible because you may need a medicine to dissolve the clot (thrombolytic) that cannot be given if too much  time has passed. Treatment options vary. Treatment options may include rest, oxygen, intravenous (IV) fluids, and medicines to thin the blood (anticoagulants). Medicines and diet may be used to address diabetes, high blood pressure, and other risk factors. Measures will be taken to prevent short-term and long-term  complications, including infection from breathing foreign material into the lungs (aspiration pneumonia), blood clots in the legs, and falls. Treatment options include procedures to either remove plaque in the carotid arteries or dilate carotid arteries that have narrowed due to plaque. Those procedures are:  Carotid endarterectomy.  Carotid angioplasty and stenting. HOME CARE INSTRUCTIONS   Take all medicines prescribed by your caregiver. Follow the directions carefully. Medicines may be used to control risk factors for a stroke. Be sure you understand all your medicine instructions.  You may be told to take aspirin or the anticoagulant warfarin. Warfarin needs to be taken exactly as instructed.  Taking too much or too little warfarin is dangerous. Too much warfarin increases the risk of bleeding. Too little warfarin continues to allow the risk for blood clots. While taking warfarin, you will need to have regular blood tests to measure your blood clotting time. A PT blood test measures how long it takes for blood to clot. Your PT is used to calculate another value called an INR. Your PT and INR help your caregiver to adjust your dose of warfarin. The dose can change for many reasons. It is critically important that you take warfarin exactly as prescribed.  Many foods, especially foods high in vitamin K can interfere with warfarin and affect the PT and INR. Foods high in vitamin K include spinach, kale, broccoli, cabbage, collard and turnip greens, brussels sprouts, peas, cauliflower, seaweed, and parsley as well as beef and pork liver, green tea, and soybean oil. You should eat a consistent amount of foods high in vitamin K. Avoid major changes in your diet, or notify your caregiver before changing your diet. Arrange a visit with a dietitian to answer your questions.  Many medicines can interfere with warfarin and affect the PT and INR. You must tell your caregiver about any and all medicines you  take, this includes all vitamins and supplements. Be especially cautious with aspirin and anti-inflammatory medicines. Do not take or discontinue any prescribed or over-the-counter medicine except on the advice of your caregiver or pharmacist.  Warfarin can have side effects, such as excessive bruising or bleeding. You will need to hold pressure over cuts for longer than usual. Your caregiver or pharmacist will discuss other potential side effects.  Avoid sports or activities that may cause injury or bleeding.  Be mindful when shaving, flossing your teeth, or handling sharp objects.  Alcohol can change the body's ability to handle warfarin. It is best to avoid alcoholic drinks or consume only very small amounts while taking warfarin. Notify your caregiver if you change your alcohol intake.  Notify your dentist or other caregivers before procedures.  Eat a diet that includes 5 or more servings of fruits and vegetables each day. This may reduce the risk of stroke. Certain diets may be prescribed to address high blood pressure, high cholesterol, diabetes, or obesity.  A low-sodium, low-saturated fat, low-trans fat, low-cholesterol diet is recommended to manage high blood pressure.  A low-saturated fat, low-trans fat, low-cholesterol, and high-fiber diet may control cholesterol levels.  A controlled-carbohydrate, controlled-sugar diet is recommended to manage diabetes.  A reduced-calorie, low-sodium, low-saturated fat, low-trans fat, low-cholesterol diet is recommended to manage obesity.  Maintain a  healthy weight.  Stay physically active. It is recommended that you get at least 30 minutes of activity on most or all days.  Do not smoke.  Limit alcohol use even if you are not taking warfarin. Moderate alcohol use is considered to be:  No more than 2 drinks each day for men.  No more than 1 drink each day for nonpregnant women.  Stop drug abuse.  Home safety. A safe home environment is  important to reduce the risk of falls. Your caregiver may arrange for specialists to evaluate your home. Having grab bars in the bedroom and bathroom is often important. Your caregiver may arrange for equipment to be used at home, such as raised toilets and a seat for the shower.  Follow all instructions for follow-up with your caregiver. This is very important. This includes any referrals and lab tests. Proper follow up can prevent a stroke or another TIA from occurring. SEEK MEDICAL CARE IF:  You have personality changes.  You have difficulty swallowing.  You are seeing double.  You have dizziness.  You have a fever.  You have skin breakdown. SEEK IMMEDIATE MEDICAL CARE IF:  Any of these symptoms may represent a serious problem that is an emergency. Do not wait to see if the symptoms will go away. Get medical help right away. Call your local emergency services (911 in [place] Do not drive yourself to the hospital.  You have sudden weakness or numbness of the face, arm, or leg, especially on one side of the body.  You have sudden trouble walking or difficulty moving arms or legs.  You have sudden confusion.  You have trouble speaking (aphasia) or understanding.  You have sudden trouble seeing in one or both eyes.  You have a loss of balance or coordination.  You have a sudden, severe headache with no known cause.  You have new chest pain or an irregular heartbeat.  You have a partial or total loss of consciousness. MAKE SURE YOU:   Understand these instructions.  Will watch your condition.  Will get help right away if you are not doing well or get worse. Document Released: 03/10/2005 Document Revised: 06/05/2013 Document Reviewed: 09/05/2013 [facility] Patient Information 2015 [place], [place]. This information is not intended to replace advice given to you by your health care provider. Make sure you discuss any questions you have with your health care provider.

## 2014-01-21 NOTE — Progress Notes (Signed)
PATIENT: Rebecca [name] DOB: 08/01/1940  REASON FOR VISIT: routine follow up for TIA HISTORY FROM: patient, daughter  HISTORY OF PRESENT ILLNESS: 73 year old female with heart disease, kidney disease, type 2 diabetes, schizoaffective disorder, and history of TIAs, bladder problems who presented to [facility] with new onset weakness involving right side of her face with drooling from the left side of her mouth as well as speech difficulty on 10/16/2013. MRI showed no evidence of acute intracranial abnormality, with mild to moderate chronic small vessel ischemic disease and cerebral atrophy. MRA showed no evidence of major intracranial arterial occlusion or significant stenosis. 2D echo showed an EF 60-65% with no source of embolus.  She was felt to have a TIA attributed to small vessel disease.  Dr. Donnetta Hutching was consulted after hospital discharge and found no evidence of any carotid disease and very soft indication of any proximal vertebral disease. There was some confusion regarding left versus right regarding her brachial pressures. Dr. Donnetta Hutching did not see any indication that there was any suspicion for vertebrobasilar insufficiency in her workup. Repeat carotid dopplers in our office showed elevated RICA blood flow velocities but this finding was not supported by the presence of plaques. She has been maintained on Plavix daily without recurrent stroke or TIA symptoms. She has seen Rheumatologist in followup which had slightly increased her prednisone, she is on a slow taper for PMR. Recent Sed Rate was 13, which was much improved over 30 on 02/02/13.  CRP was 6.4, previous last result in 09/27/12 was 3.2.  She has been referred to Hematology for abnormal protein results found by Rheumatologist. B12 and TSH were normal.  Since last visit with Dr. Colen Darling, memory and mood concerns seemed to have stabilized. She has seen Dr. Casimiro Needle in followup.  11/26/13 PRIOR HPI (VRP): Here for evaluation of multiple concerns by  daughter: safety of coming off Plavix for epidural steroid injection, progressive cognitive decline, increasing headaches, throat pain, left-sided numbness. I previously saw patient one time while rounding on the stroke service. Now patient presenting for evaluation of these new complaints.   In summary patient was admitted from 10/16/13 until 10/18/13 due to sudden onset right-sided facial weakness and speech difficulty. Patient was admitted for TIA workup. MRI showed no acute stroke. Carotid ultrasound showed atypical waveform and left vertebral artery. On 10/18/13, I noted that there was asymmetry of blood pressure in the right versus left arm (Right arm BP 150/68; left arm BP 130/57). We considered CTA of the neck versus catheter angiogram, to look for subclavian stenosis, however this was deferred to outpatient basis as patient had renal insufficiency. Patient was discharged on Plavix with followup in stroke clinic.  Since that time patient followed up with vascular surgery who did not feel that carotid ultrasound was significantly abnormal. They recommended medical management.   Over past 3 weeks patient short-term memory problems have worsened. Patient has been having some intermittent memory problems for the past 2 years. Long-term memory is good. Patient also having more mood changes lately. She has not followed up with psychiatry lately. Also patient struggling with chronic pain. She was diagnosed with polymyalgia rheumatica 2 years ago and has been treated with steroids. Steroids have been tapered over last few months. She is scheduled to see rheumatology later this month.   Patient also was recommended to consider epidural steroid injection. However, this would require coming off of Plavix for 5 days before the injection. Patient's daughter was concerned about increased  stroke risk during this time. Currently patient is not in significant pain. However she takes oral pain medications which help.    Patient having more frontal headaches lately.   REVIEW OF SYSTEMS: Full 14 system review of systems performed and notable only for fatigue, memory loss confusion headache, snoring, joint pain, aching muscles, easy bruising, easy bleeding, swollen abdomen, dysuria, urination problems,  Facial swelling, excessive eating, cold intolerance, anemia, daytime sleepiness,frequent waking.  ALLERGIES: No Known Allergies  HOME MEDICATIONS: Outpatient Prescriptions Prior to Visit  Medication Sig Dispense Refill  . acetaminophen (TYLENOL) 500 MG tablet Take 1,000 mg by mouth every 6 (six) hours as needed (pain). 2 tablets every morning and then 6 hours as needed      . albuterol (PROVENTIL HFA;VENTOLIN HFA) 108 (90 BASE) MCG/ACT inhaler Inhale 2 puffs into the lungs every 6 (six) hours as needed for wheezing or shortness of breath.      Marland Kitchen alendronate (FOSAMAX) 70 MG tablet Take 1 tablet (70 mg total) by mouth every 7 (seven) days. Take with a full glass of water on an empty stomach.  4 tablet  5  . cetirizine (ZYRTEC) 10 MG tablet Take 10 mg by mouth at bedtime.       . clopidogrel (PLAVIX) 75 MG tablet Take 1 tablet (75 mg total) by mouth daily with breakfast.  30 tablet  0  . clozapine (CLOZARIL) 200 MG tablet Take 300 mg by mouth at bedtime. Take 1.5 tablets (300 mg) at bedtime      . Cranberry (SM CRANBERRY) 300 MG tablet Take 300 mg by mouth at bedtime.       Marland Kitchen diltiazem (DILACOR XR) 180 MG 24 hr capsule Take 180 mg by mouth daily.      Marland Kitchen docusate sodium (COLACE) 100 MG capsule Take 200 mg by mouth at bedtime.   10 capsule  0  . ferrous sulfate 325 (65 FE) MG EC tablet Take 325 mg by mouth 2 (two) times daily.        . Fluticasone-Salmeterol (ADVAIR) 250-50 MCG/DOSE AEPB Inhale 1 puff into the lungs at bedtime.      . isosorbide mononitrate (IMDUR) 60 MG 24 hr tablet Take 1 tablet (60 mg total) by mouth daily.  30 tablet  6  . levothyroxine (SYNTHROID, LEVOTHROID) 50 MCG tablet Take 50 mcg by mouth  daily before breakfast.      . metoprolol succinate (TOPROL-XL) 100 MG 24 hr tablet Take 100 mg by mouth daily. Take with or immediately following a meal.      . Multiple Vitamin (MULTIVITAMIN WITH MINERALS) TABS Take 1 tablet by mouth at bedtime. Psychiatrist      . nitroGLYCERIN (NITROSTAT) 0.4 MG SL tablet Place 1 tablet (0.4 mg total) under the tongue every 5 (five) minutes as needed. For chest pain  25 tablet  3  . Omega-3 Fatty Acids (FISH OIL) 1200 MG CAPS Take 1-2 capsules by mouth at bedtime.       . pantoprazole (PROTONIX) 40 MG tablet Take 1 tablet (40 mg total) by mouth daily.  30 tablet  11  . Polyethyl Glycol-Propyl Glycol (SYSTANE OP) Apply 1 drop to eye 2 (two) times daily as needed (dry eyes).      . polyethylene glycol (MIRALAX / GLYCOLAX) packet Take 17 g by mouth daily as needed (constipation).      . pravastatin (PRAVACHOL) 20 MG tablet Take 20 mg by mouth at bedtime.      . predniSONE (  DELTASONE) 1 MG tablet Take 7 mg by mouth daily with breakfast.       . Pseudoephedrine-Guaifenesin (GUAIFEN-PSE PO) Take 600 mg by mouth daily as needed (congestion).       . tamsulosin (FLOMAX) 0.4 MG CAPS capsule Take 0.4 mg by mouth.      . temazepam (RESTORIL) 15 MG capsule Take 15 mg by mouth at bedtime as needed.       . tiotropium (SPIRIVA) 18 MCG inhalation capsule Place 18 mcg into inhaler and inhale daily.      . traMADol (ULTRAM) 50 MG tablet Take 50-100 mg by mouth 2 (two) times daily as needed for severe pain.      . Vitamin D, Ergocalciferol, (DRISDOL) 50000 UNITS CAPS capsule Take 1 capsule (50,000 Units total) by mouth every 7 (seven) days.  4 capsule  0   No facility-administered medications prior to visit.    PHYSICAL EXAM Filed Vitals:   01/21/14 1510  BP: 118/63  Pulse: 80  Height: 5\' 3"  (1.6 m)  Weight: 162 lb 6.4 oz (73.664 kg)   Body mass index is 28.78 kg/(m^2).  Generalized: Well developed, in no acute distress  Head: normocephalic and atraumatic.  Oropharynx benign  Neck: Supple, no carotid bruits  Cardiac: Regular rate rhythm, no murmur Musculoskeletal: No deformity   NEUROLOGIC: MENTAL STATUS: PLEASANT, CALM; SOMEWHAT TANGENTIAL; awake, alert, oriented to person, place and time, recent and remote memory intact, normal attention and concentration, language fluent, comprehension intact, naming intact, fund of knowledge appropriate  CRANIAL NERVE: Fundoscopic exam not done, pupils equal and reactive to light, visual fields full to confrontation, extraocular muscles intact, no nystagmus, facial sensation and strength symmetric, HEARING DECREASED, palate elevates symmetrically, uvula midline, shoulder shrug symmetric, tongue midline.  MOTOR: normal bulk and tone, full strength in the BUE, BLE  SENSORY: normal and symmetric to light touch, pinprick, temperature; VIB < 5 SEC AT TOES  COORDINATION: finger-nose-finger, fine finger movements normal  REFLEXES: BUE 1, KNEES TRACE, ANKLES ABSENT  GAIT/STATION: narrow based gait; SLOW AND CAUTIOUS, Romberg negative.  10/16/13 MRI brain / MRA head:  1. No evidence of acute intracranial abnormality.  2. Mild to moderate chronic small vessel ischemic disease and cerebral atrophy.  3. No evidence of major intracranial arterial occlusion or significant stenosis.   10/17/13 Carotid u/s:  - 1-39% internal carotid artery stenosis bilaterally. The right vertebral artery is patent with antegrade flow. The left vertebral artery is patent with antegrade flow, however the waveform is mildly atypical. This along with the 28mmHg gradient between brachial arteries is suggestive of a possible developing proximal obstruction.   11/26/13 TTE:  - Left ventricle: The cavity size was normal. There was moderate concentric hypertrophy. Systolic function was normal. The estimated ejection fraction was in the range of 60% to 65%. Wall motion was normal; there were no regional wall motion abnormalities. There was an increased  relative contribution of atrial contraction to ventricular filling. Doppler parameters are consistent with abnormal left ventricular relaxation (grade 1 diastolic dysfunction). - Aortic valve: Trivial regurgitation.   DIAGNOSTIC DATA (LABS, IMAGING, TESTING) - I reviewed patient records, labs, notes, testing and imaging myself where available.  Lab Results  Component Value Date   WBC 14.7* 10/16/2013   HGB 9.9* 10/16/2013   HCT 29.0* 10/16/2013   MCV 91.8 10/16/2013   PLT 185 10/16/2013      Component Value Date/Time   NA 131* 10/16/2013 1553   NA 137 06/21/2012   K 4.5 10/16/2013  1553   CL 93* 10/16/2013 1553   CO2 24 10/16/2013 1553   GLUCOSE 131* 10/16/2013 1553   BUN 37* 10/16/2013 1553   BUN 33* 06/21/2012   CREATININE 1.68* 10/16/2013 2027   CREATININE 1.85* 07/16/2013 1450   CREATININE 1.6* 06/21/2012   CALCIUM 10.0 10/16/2013 1553   CALCIUM 10.0 03/21/2008 2229   PROT 6.5 10/16/2013 1553   ALBUMIN 3.2* 10/16/2013 1553   AST 15 10/16/2013 1553   ALT 19 10/16/2013 1553   ALKPHOS 39 10/16/2013 1553   BILITOT 0.3 10/16/2013 1553   GFRNONAA 29* 10/16/2013 2027   GFRAA 33* 10/16/2013 2027   Lab Results  Component Value Date   CHOL 122 10/17/2013   [name] 65 10/17/2013   [name] 41 10/17/2013   LDLDIRECT 88 09/05/2007   [name] 80 10/17/2013   [name] 1.9 10/17/2013   Lab Results  Component Value Date   HGBA1C 5.7* 10/17/2013   Lab Results  Component Value Date   [id number] 4580* 11/26/2013   Lab Results  Component Value Date   TSH 1.490 11/26/2013   Lab Results  Component Value Date   ESRSEDRATE 13 11/26/2013    ASSESSMENT: 73 y.o. Caucasian female was hospitalized with concern for stroke on 10/16/13. MRI showed no acute infarct. Diagnosis was TIA from small vessel disease. Multiple vascular risk factors of CAD, HTN, DM Type 2, Chronic Kidney Disease, Former smoker, PMR, Hyperlipidemia, and Obesity. Patient has no residual deficits.  PLAN: I had a long discussion with the patient and daughter regarding her recent [place],  discussed results of evaluation in the hospital and our office and answered questions. Continue clopidogrel 75 mg orally every day  for secondary stroke prevention and maintain strict control of hypertension with blood pressure goal below 140/90, diabetes with hemoglobin A1c goal below 7% and lipids with LDL cholesterol goal below 100 mg/dL. We will plan repeat carotid doppler study in 6 months.  Keep scheduled followup with Dr. Leta Baptist as scheduled, on 03/05/14.   Rudi Rummage Jahmar Mckelvy, MSN, FNP-BC, A/GNP-C 01/21/2014, 3:34 PM [facility] [address] [place], [place] [address] [phone]  Note: This document was prepared with digital dictation and possible smart phrase technology. Any transcriptional errors that result from this process are unintentional.

## 2014-01-23 NOTE — Progress Notes (Signed)
I agree with the above plan 

## 2014-01-25 DIAGNOSIS — J449 Chronic obstructive pulmonary disease, unspecified: Secondary | ICD-10-CM | POA: Diagnosis not present

## 2014-01-25 DIAGNOSIS — N390 Urinary tract infection, site not specified: Secondary | ICD-10-CM | POA: Diagnosis not present

## 2014-01-25 DIAGNOSIS — R82998 Other abnormal findings in urine: Secondary | ICD-10-CM | POA: Diagnosis not present

## 2014-01-25 DIAGNOSIS — M353 Polymyalgia rheumatica: Secondary | ICD-10-CM | POA: Diagnosis not present

## 2014-01-25 DIAGNOSIS — R809 Proteinuria, unspecified: Secondary | ICD-10-CM | POA: Diagnosis not present

## 2014-01-25 DIAGNOSIS — I6529 Occlusion and stenosis of unspecified carotid artery: Secondary | ICD-10-CM | POA: Diagnosis not present

## 2014-01-25 DIAGNOSIS — E119 Type 2 diabetes mellitus without complications: Secondary | ICD-10-CM | POA: Diagnosis not present

## 2014-01-25 DIAGNOSIS — I10 Essential (primary) hypertension: Secondary | ICD-10-CM | POA: Diagnosis not present

## 2014-01-25 DIAGNOSIS — E785 Hyperlipidemia, unspecified: Secondary | ICD-10-CM | POA: Diagnosis not present

## 2014-01-25 IMAGING — CR DG HAND COMPLETE 3+V*R*
3 series · 3 of 3 positions shown · non-contrast
Comparison: 09/11/2007

CLINICAL DATA: Fall with right hand pain.

RIGHT HAND - COMPLETE 3+ VIEW

[x hand pa right]
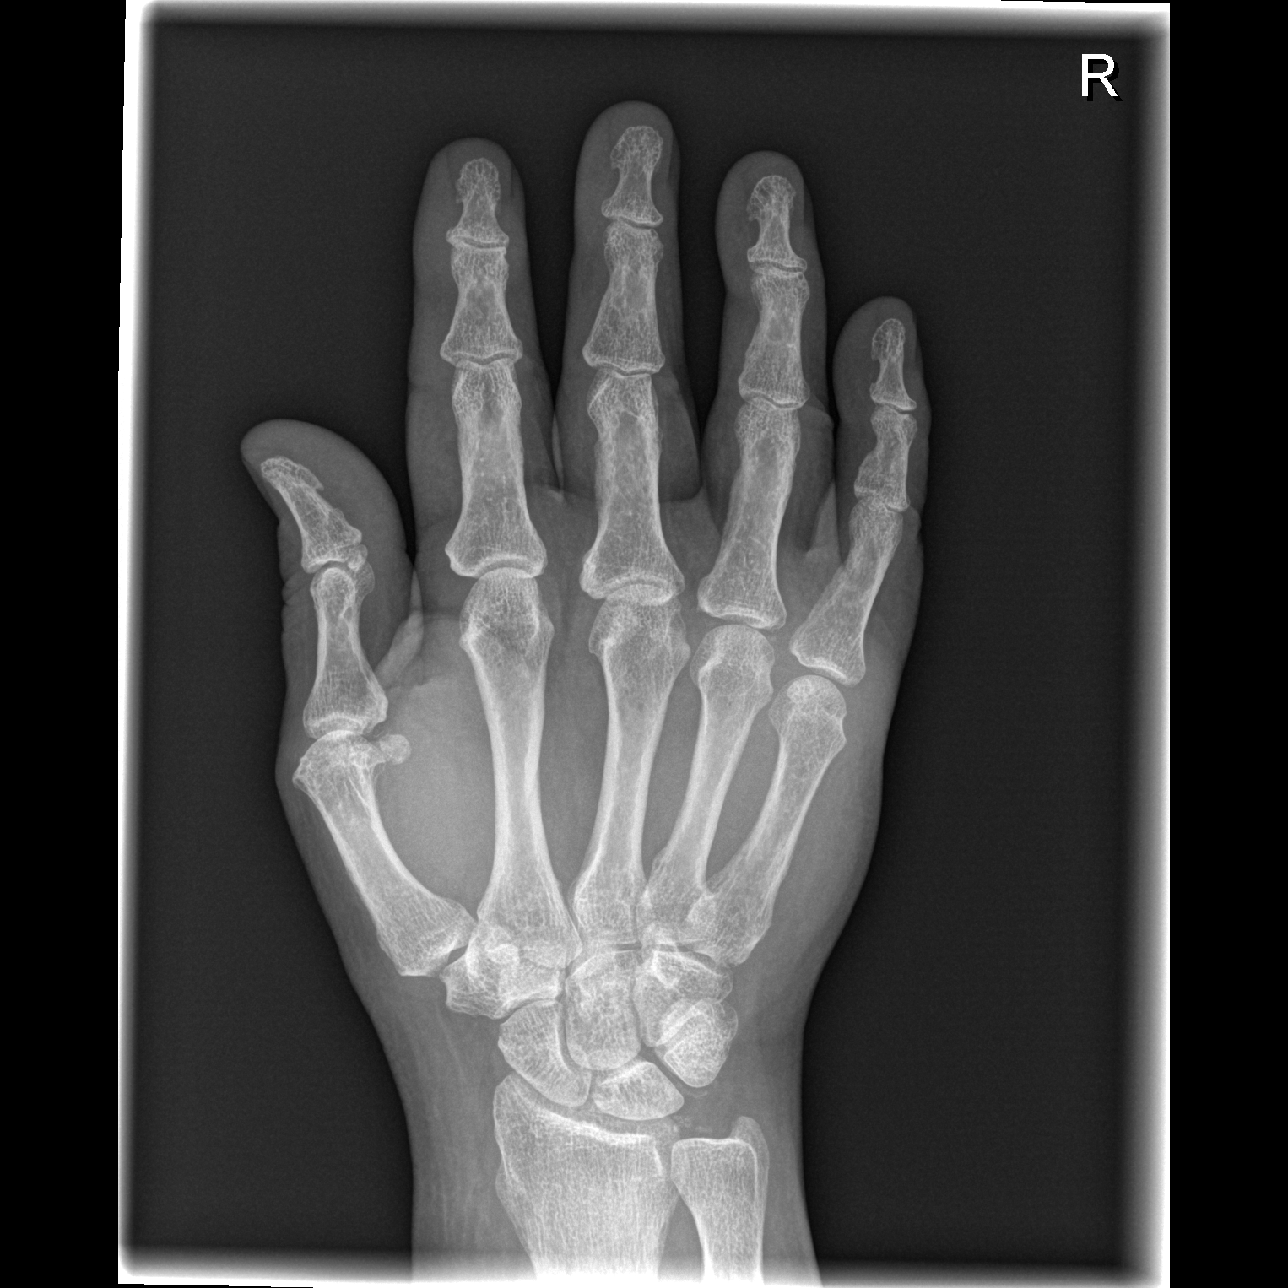

[x hand oblique right]
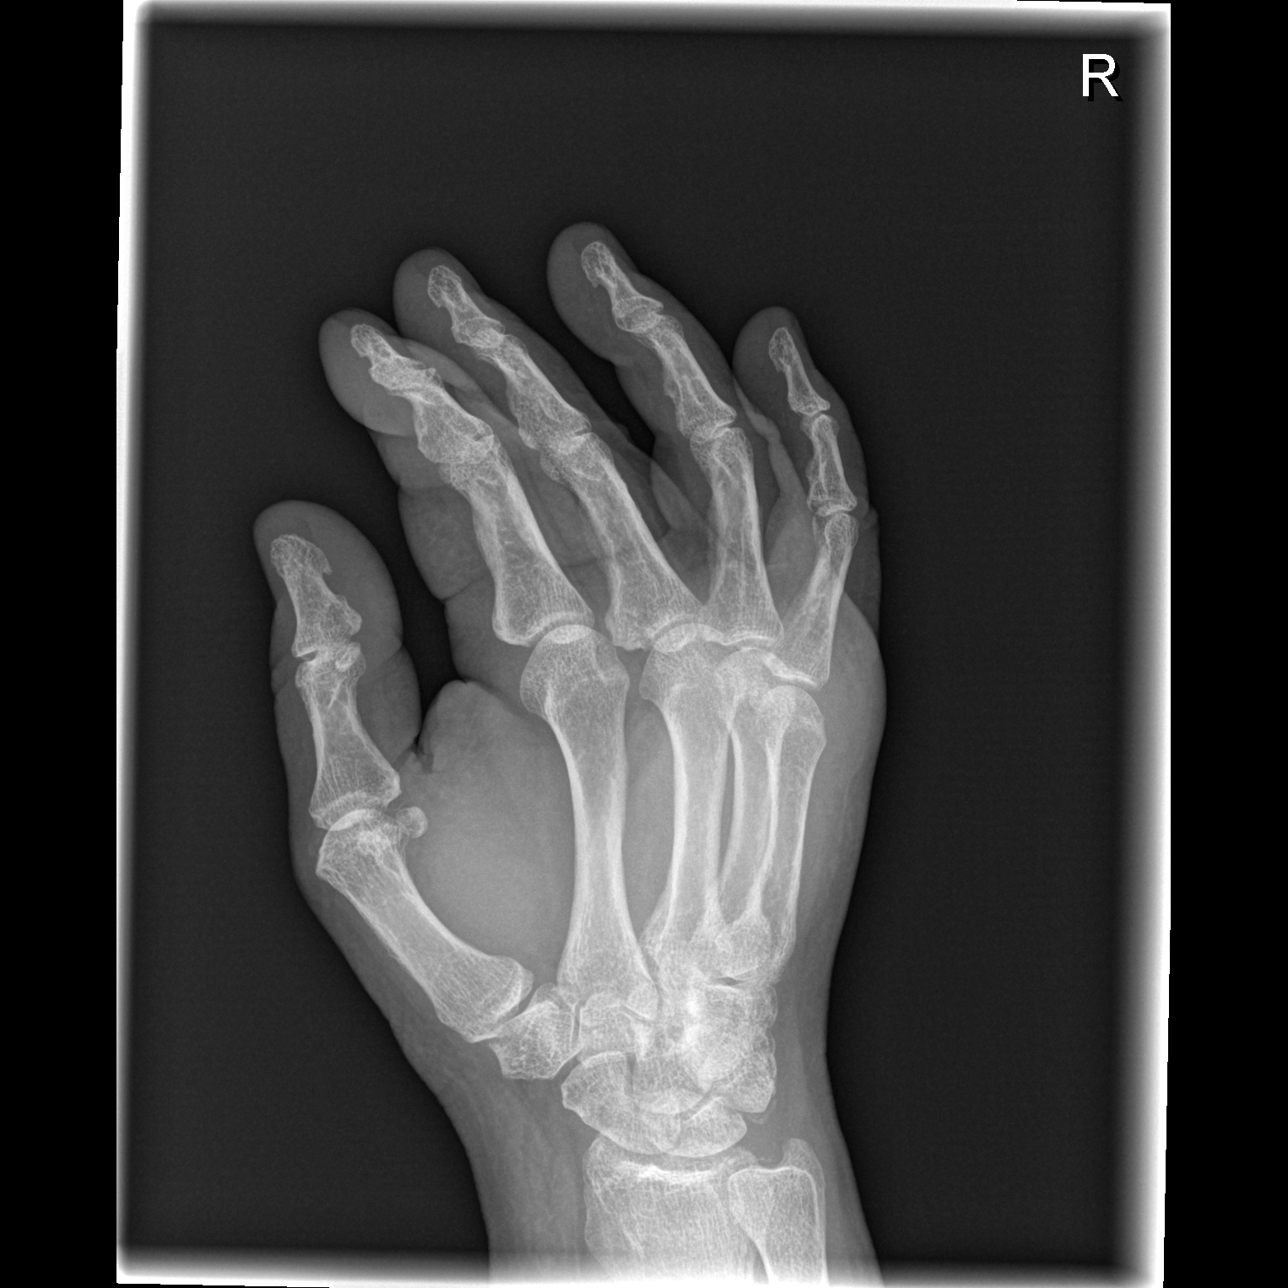

[x hand lat right]
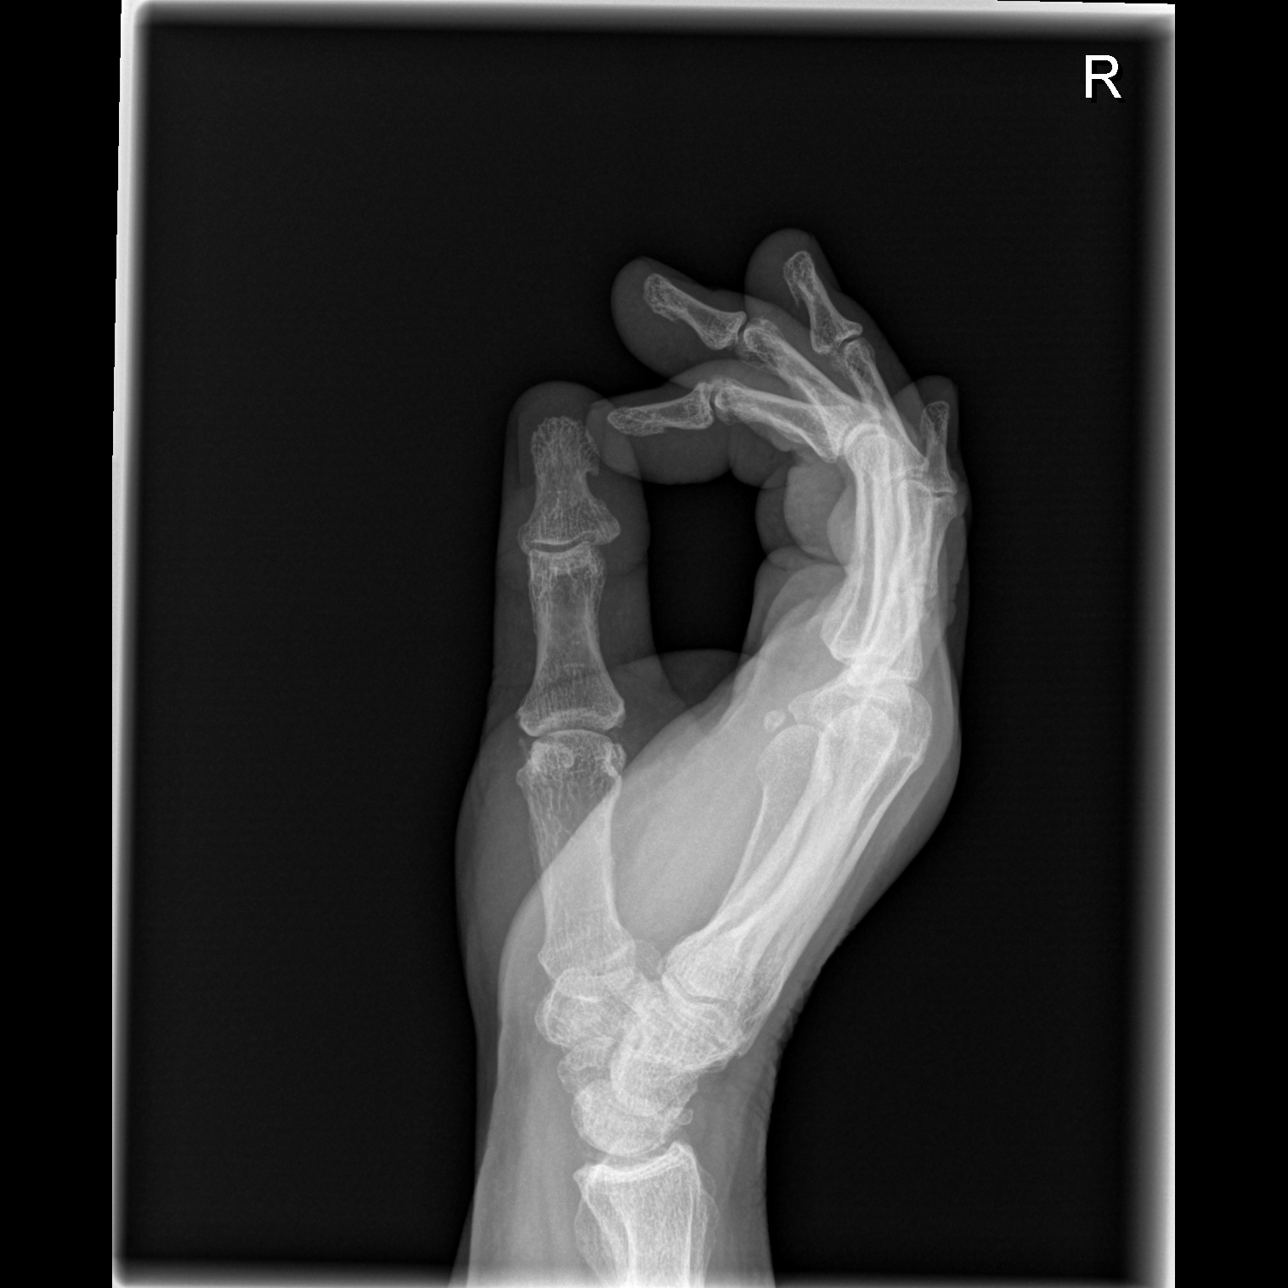

[3 of 3 positions shown; findings below may reference images not displayed]

FINDINGS: There is no evidence of acute fracture, subluxation or
dislocation.
Chondrocalcinosis within the wrist identified.
There is no evidence of radiopaque foreign body.
No focal bony lesions are present.
IMPRESSION: No evidence of acute abnormality.

Wrist chondrocalcinosis.

## 2014-01-25 IMAGING — CT CT HEAD W/O CM
1 series · 16 of 30 positions shown, 20 images · non-contrast
Comparison: 07/20/2006

CLINICAL DATA: 71 year old female with fall and right-sided
headache.

CT HEAD WITHOUT CONTRAST
TECHNIQUE: Contiguous axial images were obtained from the base of
the skull through the vertex without contrast.

[Series 2: head trauma 4.8 h37s · axial · 0.43mm/px · z∈[-104,+31]mm · 16 of 30 slices shown, 20 images]
[im 2/30  brain]
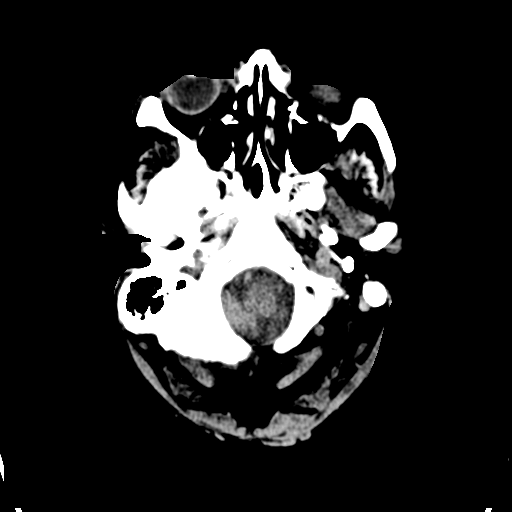
[im 2/30  bone]
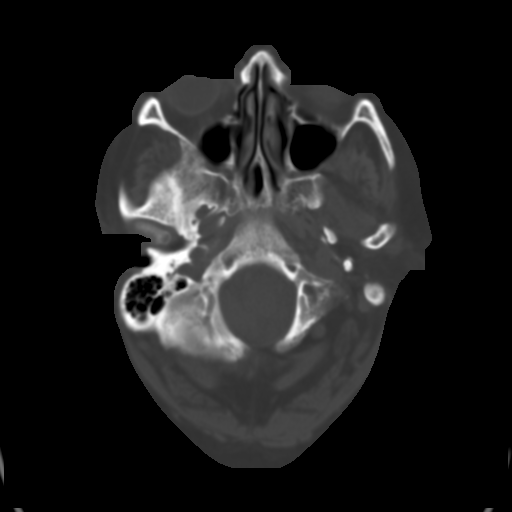
[im 4/30  brain]
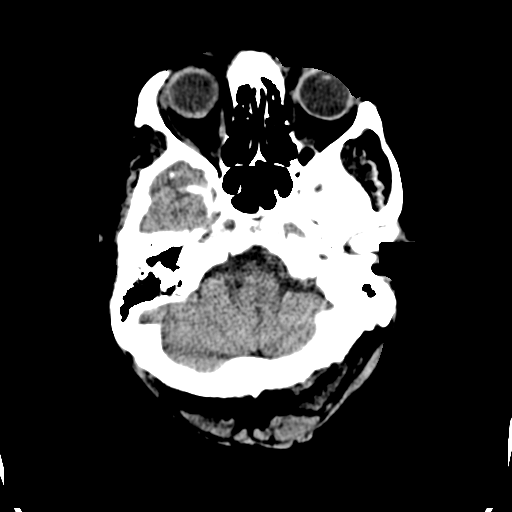
[im 6/30  brain]
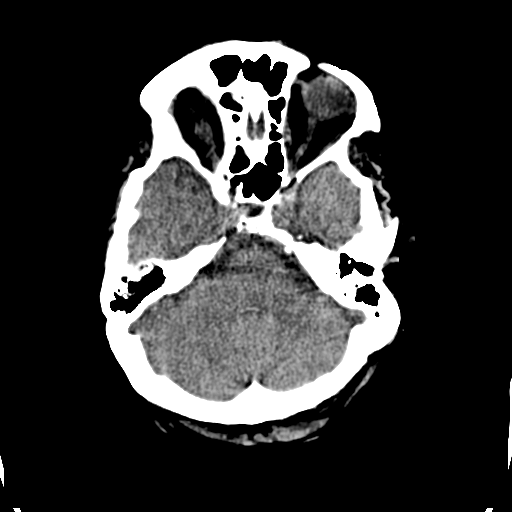
[im 8/30  brain]
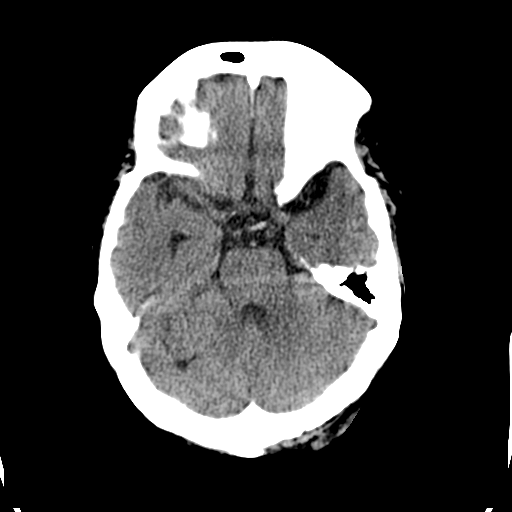
[im 9/30  brain]
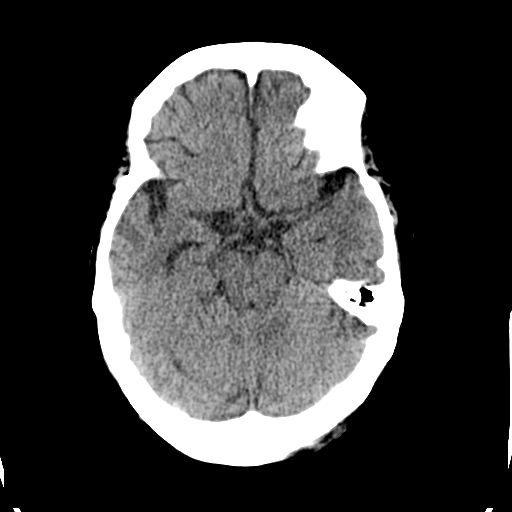
[im 9/30  bone]
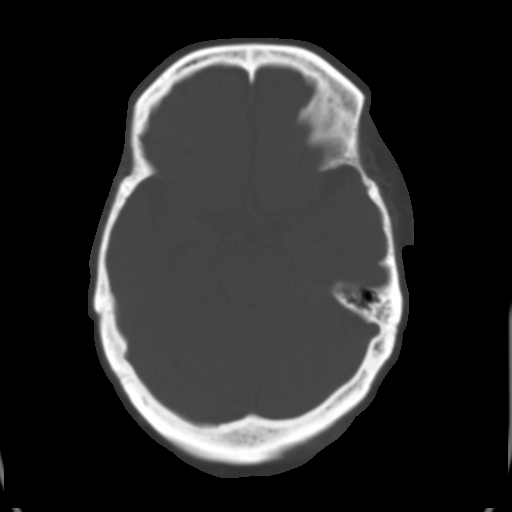
[im 11/30  brain]
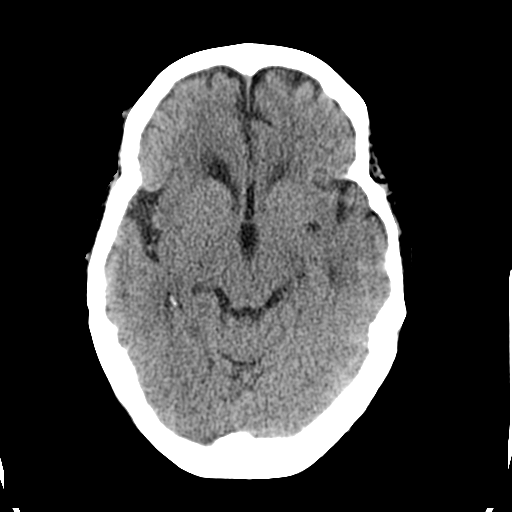
[im 13/30  brain]
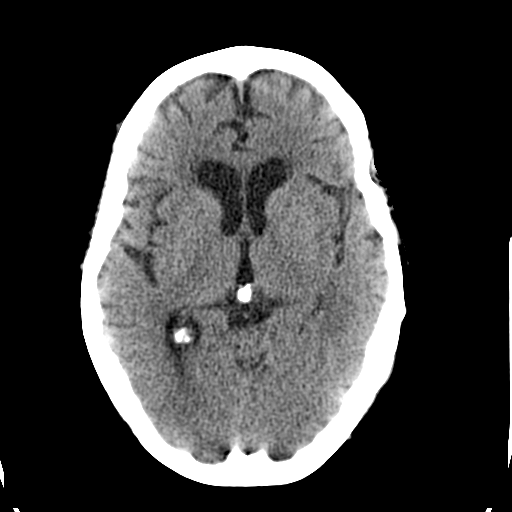
[im 15/30  brain]
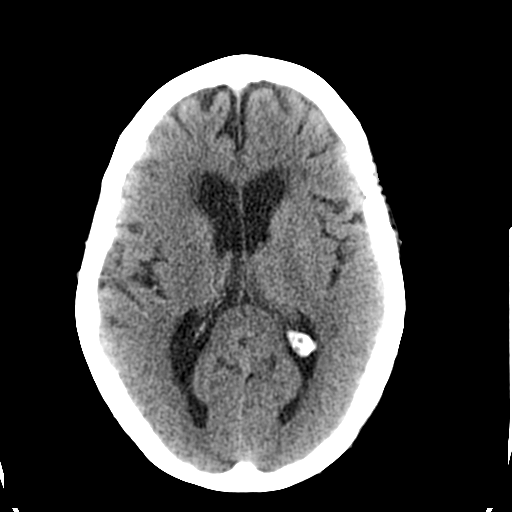
[im 16/30  brain]
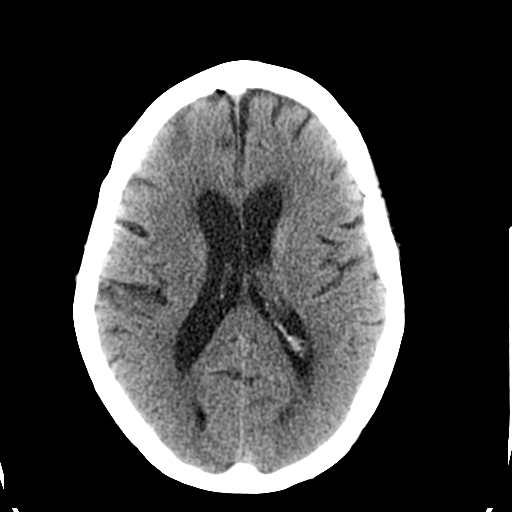
[im 16/30  bone]
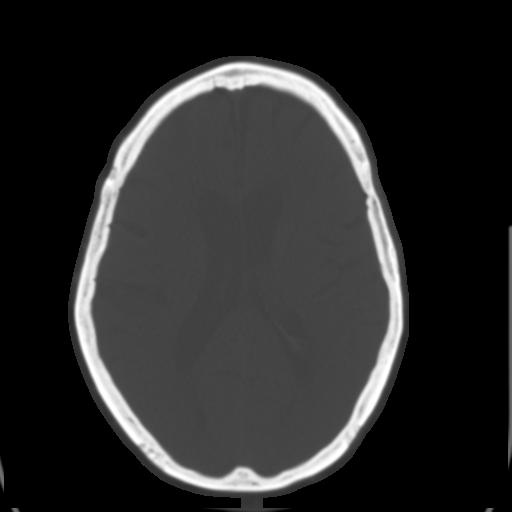
[im 18/30  brain]
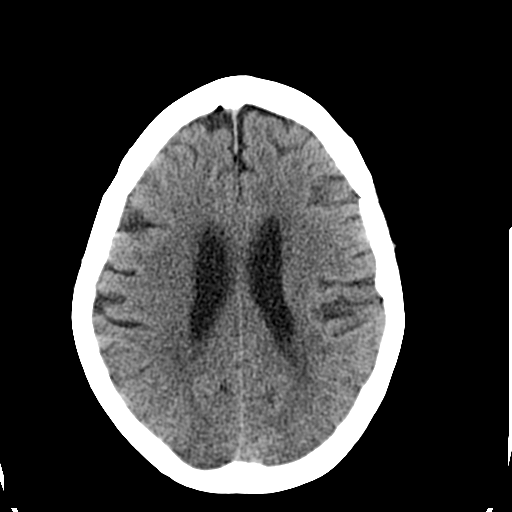
[im 20/30  brain]
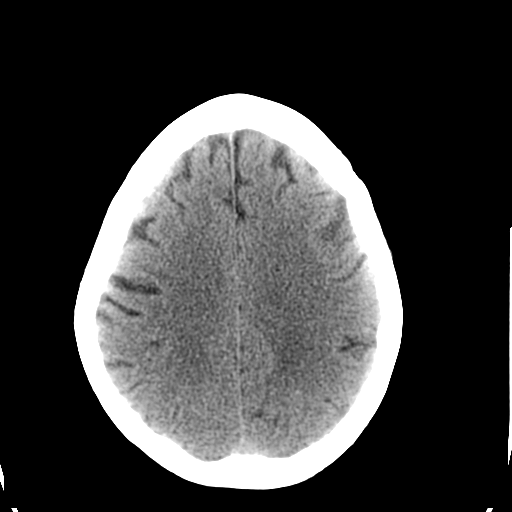
[im 22/30  brain]
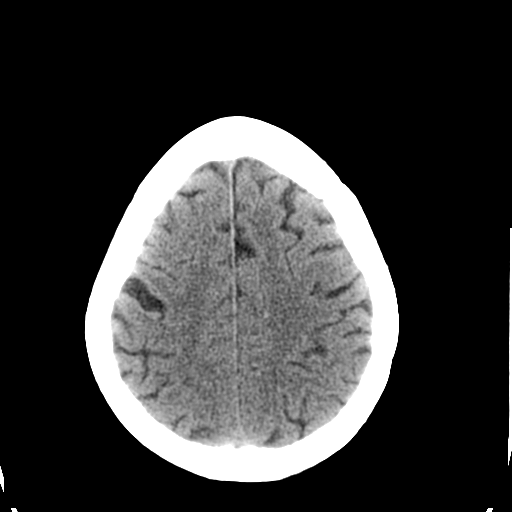
[im 23/30  brain]
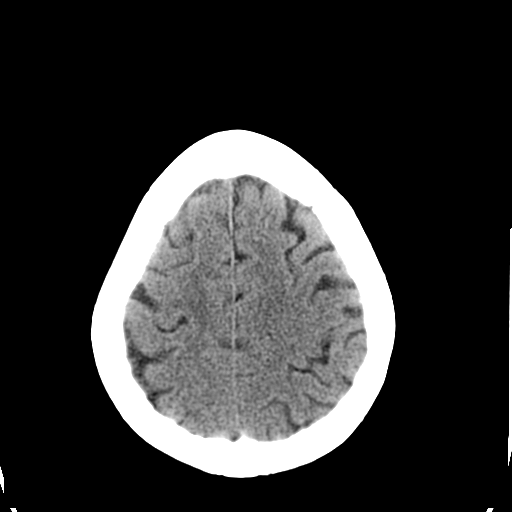
[im 23/30  bone]
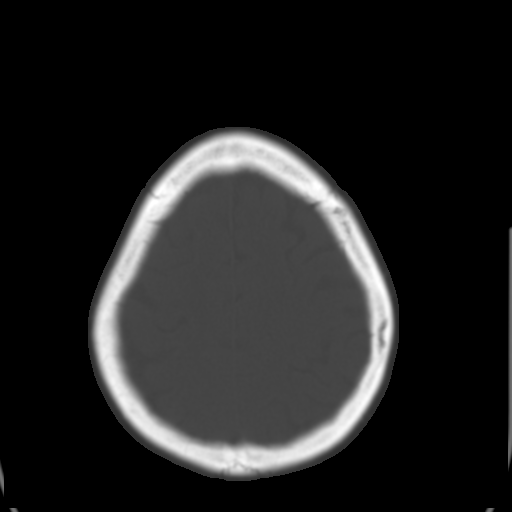
[im 25/30  brain]
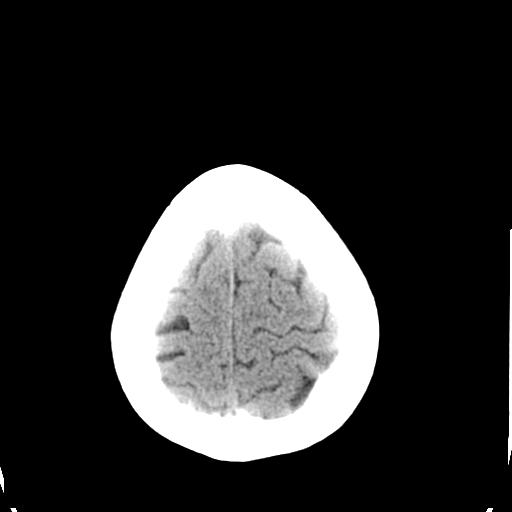
[im 27/30  brain]
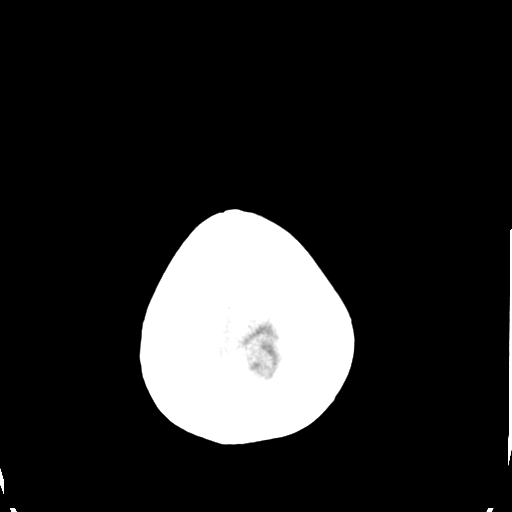
[im 29/30  brain]
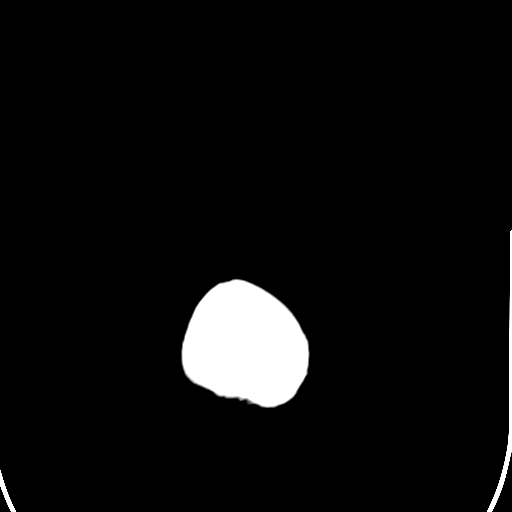

[16 of 30 positions shown; findings below may reference images not displayed]

FINDINGS: A remote right cerebellar infarct is noted.

No acute intracranial abnormalities are identified, including mass
lesion or mass effect, hydrocephalus, extra-axial fluid collection,
midline shift, hemorrhage, or acute infarction.

The visualized bony calvarium is unremarkable.
IMPRESSION: No evidence of acute intracranial abnormality.

Remote right cerebellar infarct.

## 2014-01-28 ENCOUNTER — Encounter: Payer: Self-pay | Admitting: Hematology and Oncology

## 2014-01-28 ENCOUNTER — Ambulatory Visit (HOSPITAL_BASED_OUTPATIENT_CLINIC_OR_DEPARTMENT_OTHER): Payer: Medicare Other | Admitting: Hematology and Oncology

## 2014-01-28 ENCOUNTER — Telehealth: Payer: Self-pay | Admitting: Hematology and Oncology

## 2014-01-28 ENCOUNTER — Ambulatory Visit: Payer: Medicare Other

## 2014-01-28 VITALS — BP 154/60 | HR 87 | Temp 98.1°F | Resp 18 | Ht 63.0 in | Wt 161.8 lb

## 2014-01-28 DIAGNOSIS — D649 Anemia, unspecified: Secondary | ICD-10-CM

## 2014-01-28 DIAGNOSIS — M353 Polymyalgia rheumatica: Secondary | ICD-10-CM | POA: Diagnosis not present

## 2014-01-28 DIAGNOSIS — D72829 Elevated white blood cell count, unspecified: Secondary | ICD-10-CM | POA: Diagnosis not present

## 2014-01-28 DIAGNOSIS — N189 Chronic kidney disease, unspecified: Secondary | ICD-10-CM | POA: Diagnosis not present

## 2014-01-28 DIAGNOSIS — N182 Chronic kidney disease, stage 2 (mild): Secondary | ICD-10-CM

## 2014-01-28 DIAGNOSIS — D472 Monoclonal gammopathy: Secondary | ICD-10-CM

## 2014-01-28 HISTORY — DX: Monoclonal gammopathy: D47.2

## 2014-01-28 NOTE — Progress Notes (Signed)
Checked in new patient for financial issues prior to seeing the dr. She has appt card and [place] been [place] of the [place].

## 2014-01-28 NOTE — Assessment & Plan Note (Signed)
This is likely related to prednisone therapy. Recommend close observation.

## 2014-01-28 NOTE — Progress Notes (Signed)
Addendum over the section of history of present illness. The patient does not have epistaxis and she does not use CPAP.

## 2014-01-28 NOTE — Progress Notes (Signed)
Rebecca [name]  Patient Care Team: [name], MD as PCP - General (Internal Medicine) Louis Meckel, MD as Consulting Physician (Nephrology) Pixie Casino, MD as Consulting Physician (Cardiology)  CHIEF COMPLAINTS/PURPOSE OF CONSULTATION:  IgG lambda MGUS  HISTORY OF PRESENTING ILLNESS:  Rebecca [name] 73 y.o. female is here because of recently discovered IgG lambda MGUS. Patient herself appears to have some memory deficit. A lot of history was obtained through a review of chart and collaboration of history with her daughter She denies history of abnormal bone pain or bone fracture. The patient had history of polymyalgia rheumatica and had been on high-dose prednisone therapy in the past, currently on a slow prednisone taper. Patient denies history of recurrent infection or atypical infections such as shingles of meningitis. Denies chills, night sweats, anorexia or abnormal weight loss. She had chronic bruising from antiplatelet agent and prednisone therapy. She has occasional epistaxis but she thought to be related to the use of CPAP.  MEDICAL HISTORY:  Past Medical History  Diagnosis Date  . Diabetes mellitus   . Frozen shoulder     right  . Obesity   . Renal insufficiency   . COPD (chronic obstructive pulmonary disease)   . Shortness of breath   . Stroke   . CAD (coronary artery disease) 2006    Taxus stent of mid RCA. negative myoview 2014 and normal LV function  . Diastolic heart failure, NYHA class 1   . Polymyalgia rheumatica   . MGUS (monoclonal gammopathy of unknown significance) 01/28/2014    SURGICAL HISTORY: Past Surgical History  Procedure Laterality Date  . Partial hysterectomy  1995  . Abdominal hysterectomy    . Cholecystectomy    . Coronary angioplasty with stent placement  2006    Taxus DES to RCA, 50 % residual    SOCIAL HISTORY: History   Social History  . Marital Status: Divorced    Spouse Name: N/A     Number of Children: 3  . Years of Education: 9th   Occupational History  . homemaker    Social History Main Topics  . Smoking status: Former Smoker -- 1.00 packs/day for 35 years    Quit date: 04/14/2004  . Smokeless tobacco: Never Used  . Alcohol Use: No  . Drug Use: No  . Sexual Activity: Not on file   Other Topics Concern  . Not on file   Social History Narrative   Daughter [name] and son are very involved.  [name] comes to most office visits.   Recent resident at [facility]; daughter unhappy with care and taking her to live at home, after Jan 30th. Daughter is HCPOA, not averse to SNF/rehab in short-term, but does not wish pt to return to [facility] and requests that pt/pt's care not be discussed by [facility] providers to [facility], moving forward.   Caffeine Use: 1-3 cups daily    FAMILY HISTORY: Family History  Problem Relation Age of Onset  . Diabetes Mother   . Heart disease Mother   . Heart disease Father   . Arthritis Father   . Healthy Sister   . Heart disease Brother   . Cancer Brother     colon ca  . Osteoporosis Sister   . Heart disease Brother   . Diabetes Brother   . Heart disease Brother   . Diabetes Brother   . Diabetes Brother   . Healthy Brother   . Healthy Brother   .  Healthy Brother   . Cancer Maternal Uncle     blood condition  . Cancer Daughter     breast ca  . Cancer Cousin     NHL    ALLERGIES:  has No Known Allergies.  MEDICATIONS:  Current Outpatient Prescriptions  Medication Sig Dispense Refill  . acetaminophen (TYLENOL) 500 MG tablet Take 1,000 mg by mouth every 6 (six) hours as needed (pain). 2 tablets every morning and then 6 hours as needed      . albuterol (PROVENTIL HFA;VENTOLIN HFA) 108 (90 BASE) MCG/ACT inhaler Inhale 2 puffs into the lungs every 6 (six) hours as needed for wheezing or shortness of breath.      Marland Kitchen alendronate (FOSAMAX) 70 MG tablet Take 1 tablet (70 mg total) by mouth every 7 (seven) days.  Take with a full glass of water on an empty stomach.  4 tablet  5  . cetirizine (ZYRTEC) 10 MG tablet Take 10 mg by mouth at bedtime.       . ciprofloxacin (CIPRO) 250 MG tablet Take 250 mg by mouth 2 (two) times daily.      . clopidogrel (PLAVIX) 75 MG tablet Take 1 tablet (75 mg total) by mouth daily with breakfast.  30 tablet  0  . clozapine (CLOZARIL) 200 MG tablet Take 300 mg by mouth at bedtime. Take 1.5 tablets (300 mg) at bedtime      . Cranberry (SM CRANBERRY) 300 MG tablet Take 300 mg by mouth at bedtime.       Marland Kitchen diltiazem (DILACOR XR) 180 MG 24 hr capsule Take 180 mg by mouth daily.      Marland Kitchen docusate sodium (COLACE) 100 MG capsule Take 200 mg by mouth at bedtime.   10 capsule  0  . ferrous sulfate 325 (65 FE) MG EC tablet Take 325 mg by mouth 2 (two) times daily.        . Fluticasone-Salmeterol (ADVAIR) 250-50 MCG/DOSE AEPB Inhale 1 puff into the lungs at bedtime.      . isosorbide mononitrate (IMDUR) 60 MG 24 hr tablet Take 1 tablet (60 mg total) by mouth daily.  30 tablet  6  . levothyroxine (SYNTHROID, LEVOTHROID) 50 MCG tablet Take 50 mcg by mouth daily before breakfast.      . metoprolol succinate (TOPROL-XL) 100 MG 24 hr tablet Take 100 mg by mouth daily. Take with or immediately following a meal.      . Multiple Vitamin (MULTIVITAMIN WITH MINERALS) TABS Take 1 tablet by mouth at bedtime. Psychiatrist      . nitroGLYCERIN (NITROSTAT) 0.4 MG SL tablet Place 1 tablet (0.4 mg total) under the tongue every 5 (five) minutes as needed. For chest pain  25 tablet  3  . Omega-3 Fatty Acids (FISH OIL) 1200 MG CAPS Take 1-2 capsules by mouth at bedtime.       . pantoprazole (PROTONIX) 40 MG tablet Take 1 tablet (40 mg total) by mouth daily.  30 tablet  11  . Polyethyl Glycol-Propyl Glycol (SYSTANE OP) Apply 1 drop to eye 2 (two) times daily as needed (dry eyes).      . polyethylene glycol (MIRALAX / GLYCOLAX) packet Take 17 g by mouth daily as needed (constipation).      . pravastatin  (PRAVACHOL) 20 MG tablet Take 20 mg by mouth at bedtime.      . predniSONE (DELTASONE) 1 MG tablet Take 7 mg by mouth daily with breakfast.       .  Pseudoephedrine-Guaifenesin (GUAIFEN-PSE PO) Take 600 mg by mouth daily as needed (congestion).       . tamsulosin (FLOMAX) 0.4 MG CAPS capsule Take 0.4 mg by mouth.      . temazepam (RESTORIL) 15 MG capsule Take 15 mg by mouth at bedtime as needed.       . tiotropium (SPIRIVA) 18 MCG inhalation capsule Place 18 mcg into inhaler and inhale daily.      . traMADol (ULTRAM) 50 MG tablet Take 50-100 mg by mouth 2 (two) times daily as needed for severe pain.      . Vitamin D, Ergocalciferol, (DRISDOL) 50000 UNITS CAPS capsule Take 1 capsule (50,000 Units total) by mouth every 7 (seven) days.  4 capsule  0   No current facility-administered medications for this visit.    REVIEW OF SYSTEMS:   Eyes: Denies blurriness of vision, double vision or watery eyes Ears, nose, mouth, throat, and face: Denies mucositis or sore throat Respiratory: Denies cough, dyspnea or wheezes Cardiovascular: Denies palpitation, chest discomfort or lower extremity swelling Gastrointestinal:  Denies nausea, heartburn or change in bowel habits Skin: Denies abnormal skin rashes Lymphatics: Denies new lymphadenopathy  Neurological:Denies numbness, tingling or new weaknesses Behavioral/Psych: Mood is stable, no new changes  All other systems were reviewed with the patient and are negative.  PHYSICAL EXAMINATION: ECOG PERFORMANCE STATUS: 1 - Symptomatic but completely ambulatory  Filed Vitals:   01/28/14 1105  BP: 154/60  Pulse: 87  Temp: 98.1 F (36.7 C)  Resp: 18   Filed Weights   01/28/14 1105  Weight: 161 lb 12.8 oz (73.392 kg)    GENERAL:alert, no distress and comfortable today; she looks obese and cushingoid SKIN: skin color, texture, turgor are normal, no rashes or significant lesions. She had extensive bruises EYES: normal, conjunctiva are pink and  non-injected, sclera clear OROPHARYNX:no exudate, no erythema and lips, buccal mucosa, and tongue normal  NECK: supple, thyroid normal size, non-tender, without nodularity LYMPH:  no palpable lymphadenopathy in the cervical, axillary or inguinal LUNGS: clear to auscultation and percussion with normal breathing effort HEART: regular rate & rhythm and no murmurs and no lower extremity edema ABDOMEN:abdomen soft, non-tender and normal bowel sounds Musculoskeletal:no cyanosis of digits and no clubbing  PSYCH: alert & oriented x 3 with fluent speech NEURO: no focal motor/sensory deficits. She appears to have some memory deficits  LABORATORY DATA:  I have reviewed the data as listed Lab Results  Component Value Date   WBC 14.7* 10/16/2013   HGB 9.9* 10/16/2013   HCT 29.0* 10/16/2013   MCV 91.8 10/16/2013   PLT 185 10/16/2013   ASSESSMENT & PLAN:  MGUS (monoclonal gammopathy of unknown significance) Clinically, she does not appear to have signs and symptoms to suggest multiple myeloma. 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 I would recommend rechecking her blood work, 24-hour urine collection and skeletal survey in 3 months. I spent a lot of time talking to her daughter the pathophysiology of monoclonal gammopathy and the natural history of disease progression. I addressed all her questions and concerns  Polymyalgia rheumatica She had been receiving chronic prednisone therapy for this. I would defer to a rheumatologist for steroid taper.  Chronic kidney disease This is unlikely due to MGUS. She will continue followup with a nephrologist  ANEMIA, NORMOCYTIC, CHRONIC This is likely anemia of chronic disease. The patient denies recent history of bleeding such as epistaxis, hematuria or hematochezia. She is asymptomatic from the anemia. We will observe for  now.   Leukocytosis This is likely related to prednisone therapy. Recommend close observation.    Orders Placed This  Encounter  Procedures  . DG Bone Survey Met    Standing Status: Future     Number of Occurrences:      Standing Expiration Date: 03/30/2015    Order Specific Question:  Reason for Exam (SYMPTOM  OR DIAGNOSIS REQUIRED)    Answer:  staging myeloma    Order Specific Question:  Preferred imaging location?    Answer:  [facility]  . CBC with Differential    Standing Status: Future     Number of Occurrences:      Standing Expiration Date: 03/30/2015  . Comprehensive metabolic panel    Standing Status: Future     Number of Occurrences:      Standing Expiration Date: 03/30/2015  . Lactate dehydrogenase    Standing Status: Future     Number of Occurrences:      Standing Expiration Date: 03/30/2015  . SPEP & IFE with QIG    Standing Status: Future     Number of Occurrences:      Standing Expiration Date: 03/30/2015  . Kappa/lambda light chains    Standing Status: Future     Number of Occurrences:      Standing Expiration Date: 03/30/2015  . Beta 2 microglobulin, serum    Standing Status: Future     Number of Occurrences:      Standing Expiration Date: 03/30/2015  . Protein Electro, 24-Hour Urine    Standing Status: Future     Number of Occurrences:      Standing Expiration Date: 03/30/2015  . IFE, Urine (with Tot Prot)    Standing Status: Future     Number of Occurrences:      Standing Expiration Date: 03/30/2015    All questions were answered. The patient knows to call the clinic with any problems, questions or concerns. I spent 40 minutes counseling the patient face to face. The total time spent in the appointment was 55 minutes and more than 50% was on counseling.     [facility], [place], MD 01/28/2014 9:02 PM

## 2014-01-28 NOTE — Assessment & Plan Note (Signed)
She had been receiving chronic prednisone therapy for this. I would defer to a rheumatologist for steroid taper.

## 2014-01-28 NOTE — Assessment & Plan Note (Signed)
This is likely anemia of chronic disease. The patient denies recent history of bleeding such as epistaxis, hematuria or hematochezia. She is asymptomatic from the anemia. We will observe for now.  

## 2014-01-28 NOTE — Assessment & Plan Note (Signed)
Clinically, she does not appear to have signs and symptoms to suggest multiple myeloma. 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 I would recommend rechecking her blood work, 24-hour urine collection and skeletal survey in 3 months. I spent a lot of time talking to her daughter the pathophysiology of monoclonal gammopathy and the natural history of disease progression. I addressed all her questions and concerns

## 2014-01-28 NOTE — Telephone Encounter (Signed)
Pt confirmed labs/ov per 08/17 POF, gave pt AVS.....KJ °

## 2014-01-28 NOTE — Assessment & Plan Note (Signed)
This is unlikely due to MGUS. She will continue followup with a nephrologist   

## 2014-01-31 IMAGING — CR DG CHEST 2V
2 series · 2 of 2 positions shown · non-contrast
Comparison: Chest x-ray of 09/03/2009

CLINICAL DATA: For [HOSPITAL] placement, evaluate for
tuberculosis, recent fall

CHEST - 2 VIEW

[view not recorded (1 of 2)]
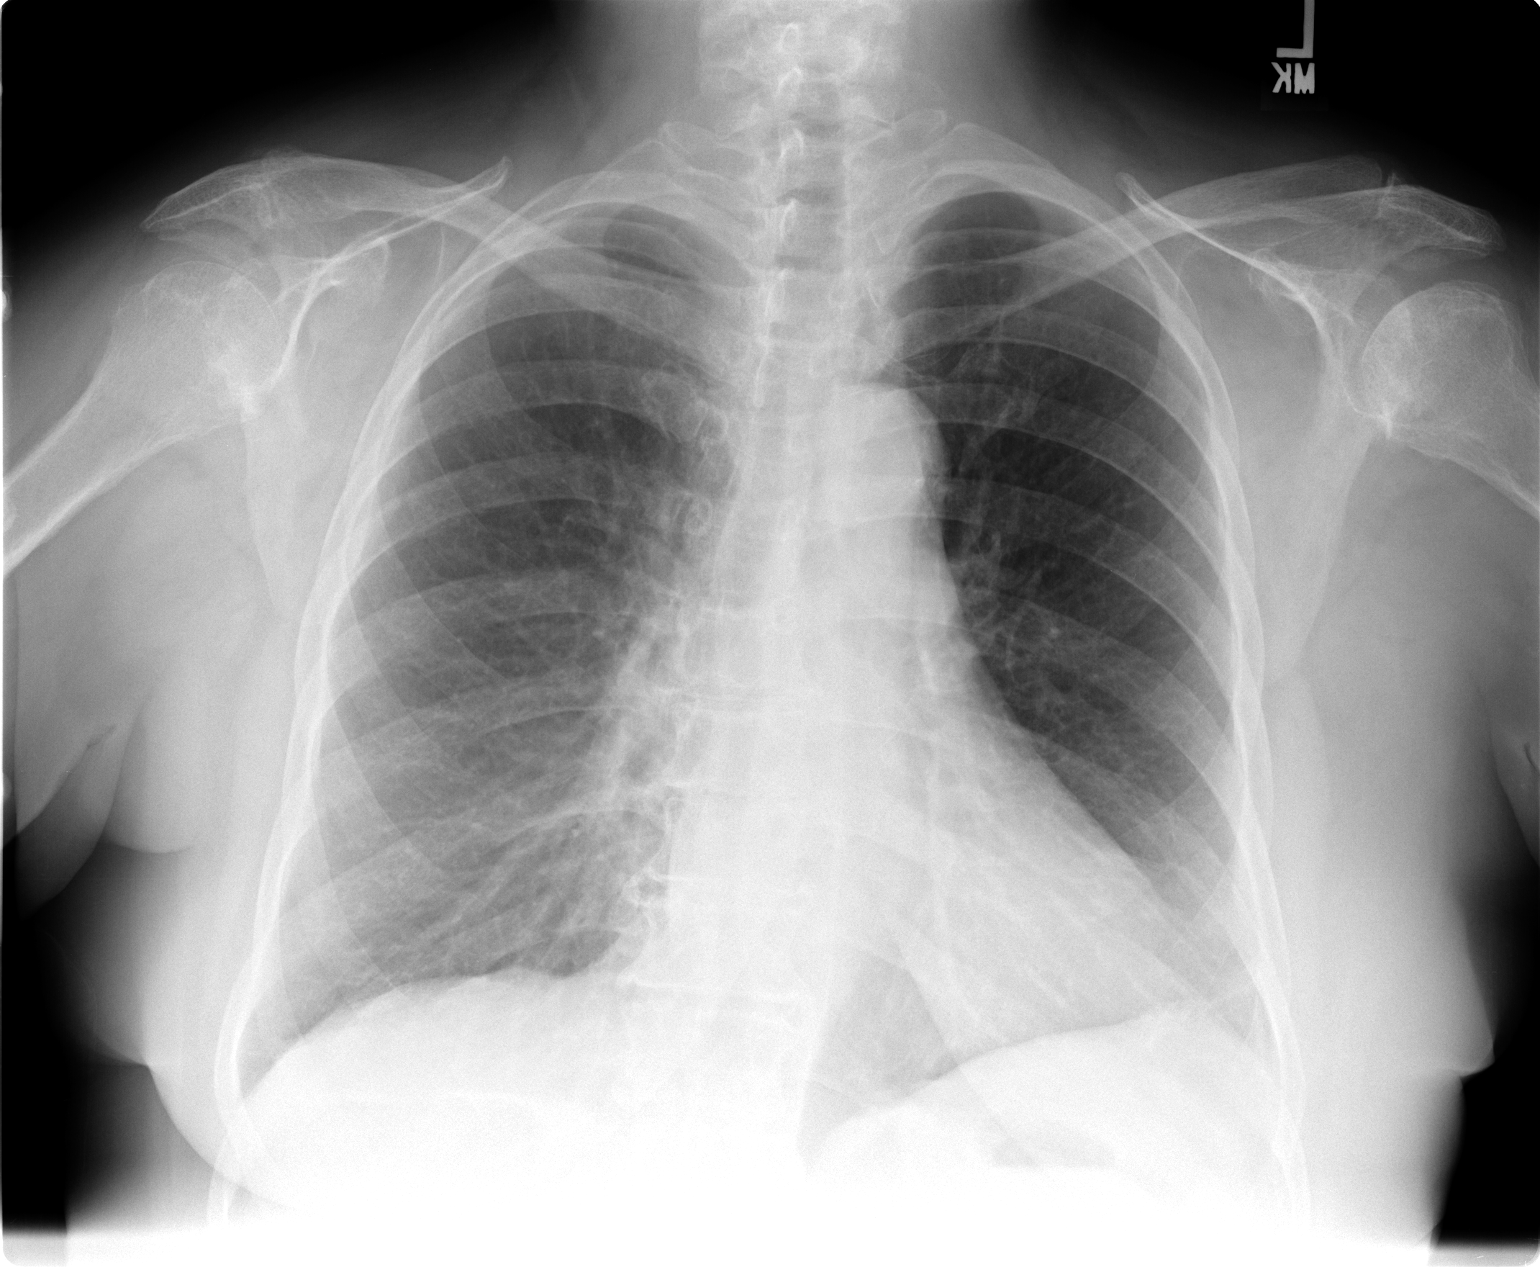

[view not recorded (2 of 2)]
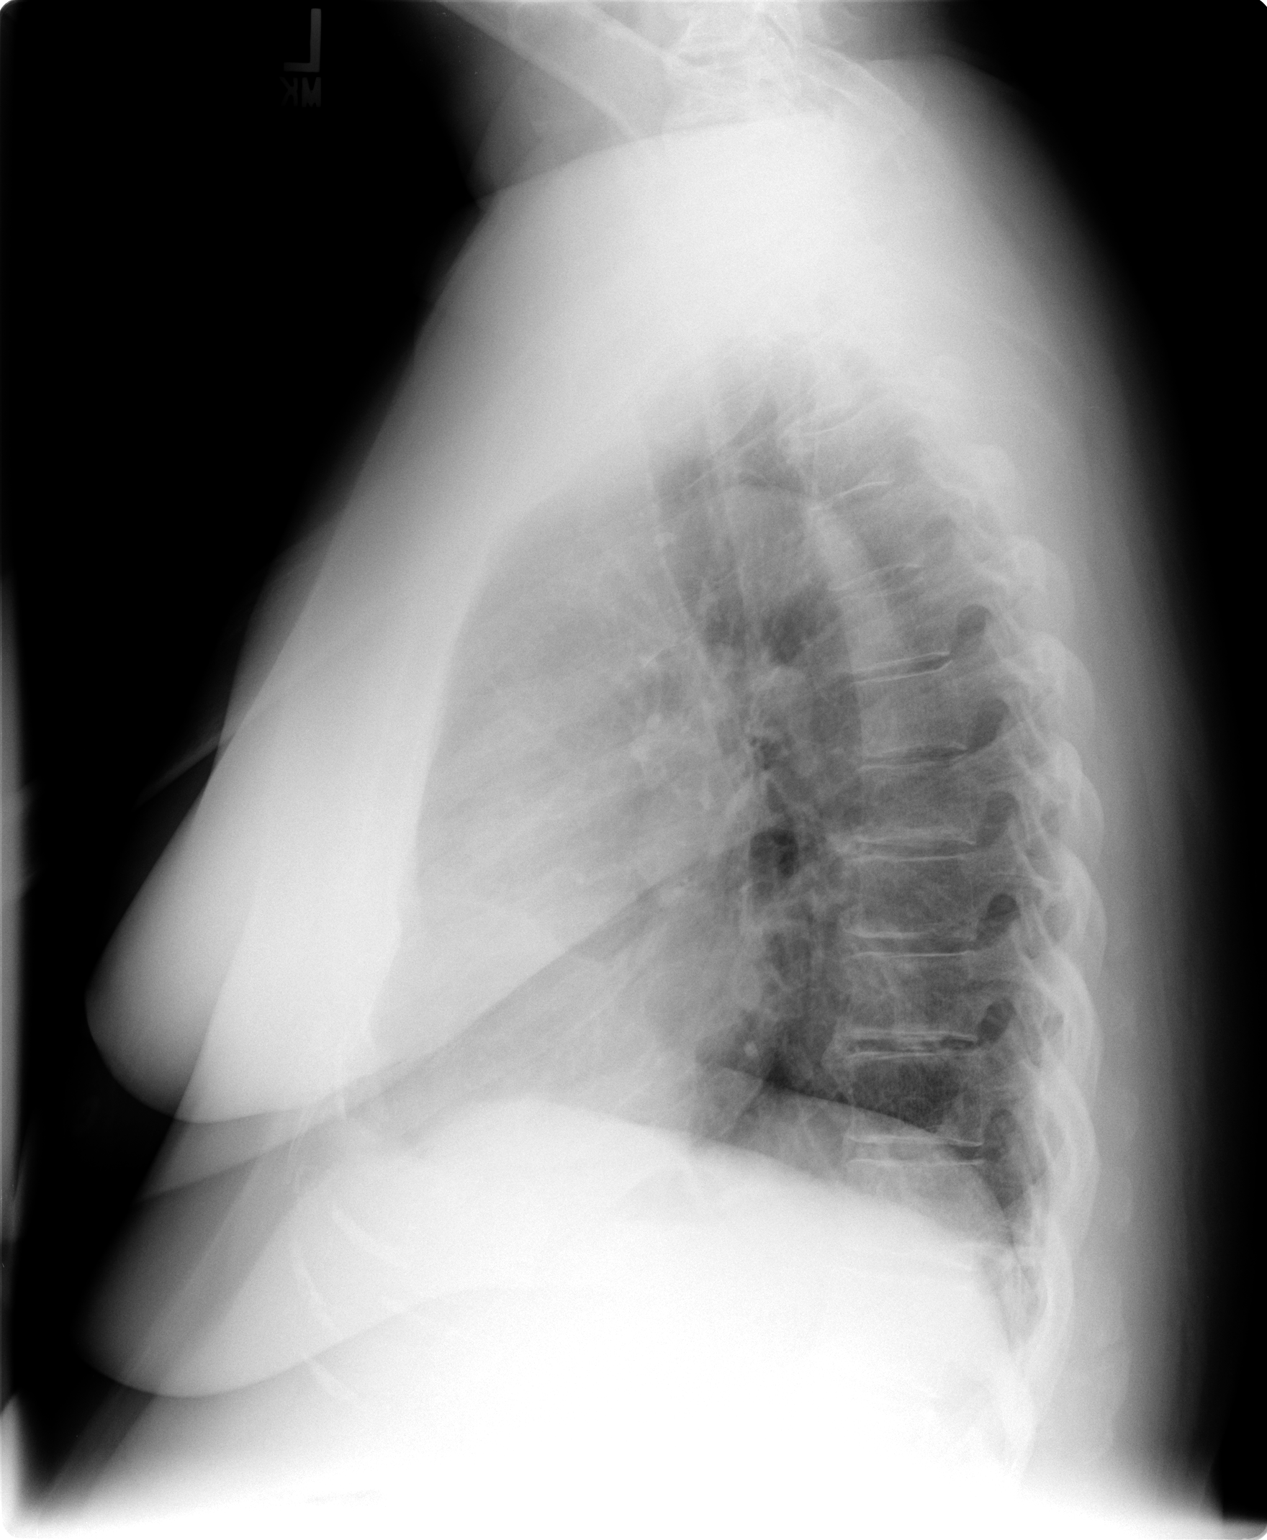

[2 of 2 positions shown; findings below may reference images not displayed]

FINDINGS: The lungs are clear.  The heart is mildly enlarged and
stable.  There are mild degenerative changes in the lower thoracic
spine.
IMPRESSION: No active lung disease. Mild cardiomegaly.

## 2014-02-05 DIAGNOSIS — Z79899 Other long term (current) drug therapy: Secondary | ICD-10-CM | POA: Diagnosis not present

## 2014-02-11 DIAGNOSIS — R339 Retention of urine, unspecified: Secondary | ICD-10-CM | POA: Diagnosis not present

## 2014-02-20 DIAGNOSIS — M159 Polyosteoarthritis, unspecified: Secondary | ICD-10-CM | POA: Diagnosis not present

## 2014-02-20 DIAGNOSIS — D472 Monoclonal gammopathy: Secondary | ICD-10-CM | POA: Diagnosis not present

## 2014-02-20 DIAGNOSIS — M353 Polymyalgia rheumatica: Secondary | ICD-10-CM | POA: Diagnosis not present

## 2014-02-20 DIAGNOSIS — M810 Age-related osteoporosis without current pathological fracture: Secondary | ICD-10-CM | POA: Diagnosis not present

## 2014-03-04 DIAGNOSIS — N183 Chronic kidney disease, stage 3 unspecified: Secondary | ICD-10-CM | POA: Diagnosis not present

## 2014-03-04 DIAGNOSIS — I129 Hypertensive chronic kidney disease with stage 1 through stage 4 chronic kidney disease, or unspecified chronic kidney disease: Secondary | ICD-10-CM | POA: Diagnosis not present

## 2014-03-04 DIAGNOSIS — N390 Urinary tract infection, site not specified: Secondary | ICD-10-CM | POA: Diagnosis not present

## 2014-03-04 DIAGNOSIS — F200 Paranoid schizophrenia: Secondary | ICD-10-CM | POA: Diagnosis not present

## 2014-03-04 DIAGNOSIS — M810 Age-related osteoporosis without current pathological fracture: Secondary | ICD-10-CM | POA: Diagnosis not present

## 2014-03-05 ENCOUNTER — Telehealth: Payer: Self-pay | Admitting: Hematology and Oncology

## 2014-03-05 ENCOUNTER — Ambulatory Visit: Payer: Medicare Other | Admitting: Diagnostic Neuroimaging

## 2014-03-05 NOTE — Telephone Encounter (Signed)
Faxed pt medical records to [facility] [place]

## 2014-03-17 IMAGING — CT CT ABD-PELV W/O CM
2 of 4 series · 17 of 46 positions shown, 19 images · non-contrast
Comparison: Unenhanced CT abdomen and pelvis 09/10/2010,
09/01/2009.

CLINICAL DATA: Abdominal pain.  Urinary retention.  Renal
insufficiency with creatinine of 1.6 and estimated GFR of 30.
Surgical history includes cholecystectomy.

CT ABDOMEN AND PELVIS WITHOUT CONTRAST
TECHNIQUE: Multidetector CT imaging of the abdomen and pelvis was
performed following the standard protocol without intravenous
contrast.

[Series 2: abd/pelv w/o 5.0 b31f st · axial · non-contrast · 0.83mm/px · z∈[-466,-60]mm · 14 of 89 slices shown, 16 images]
[im 4/89  soft-tissue]
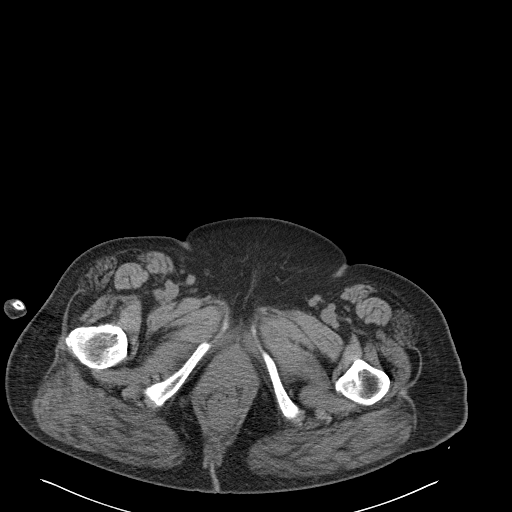
[im 4/89  bone]
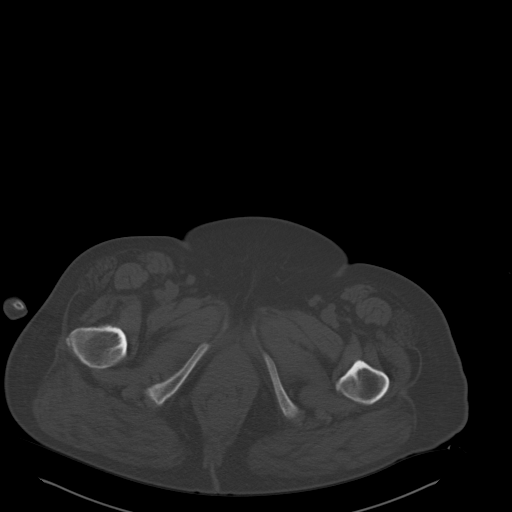
[im 11/89  soft-tissue]
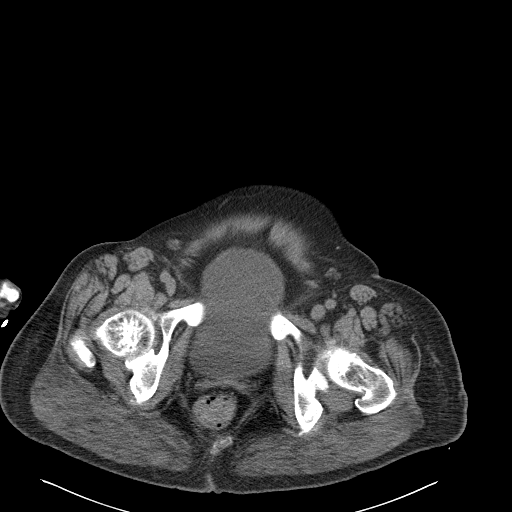
[im 18/89  soft-tissue]
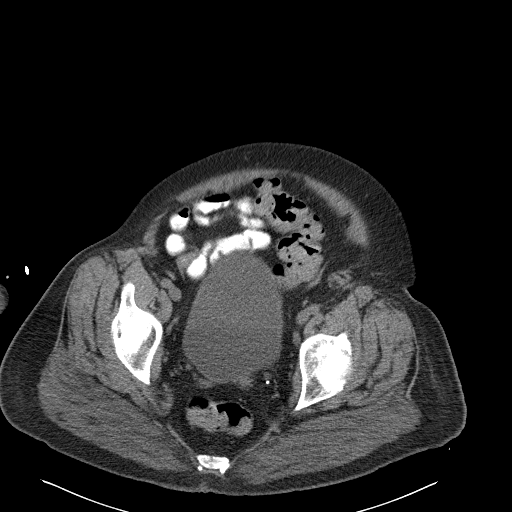
[im 25/89  soft-tissue]
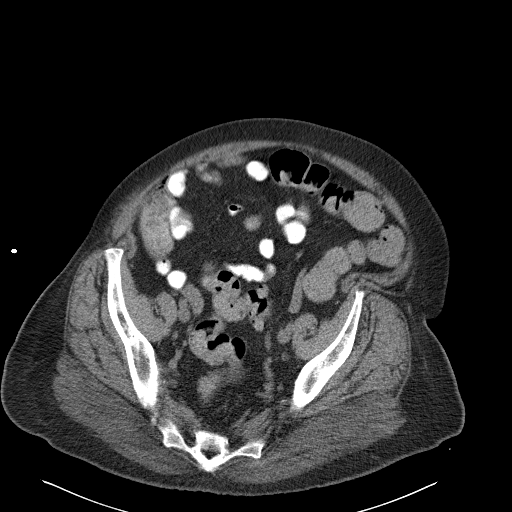
[im 29/89  soft-tissue]
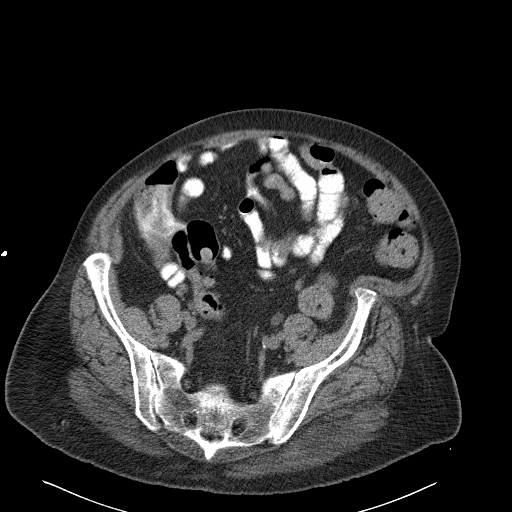
[im 36/89  soft-tissue]
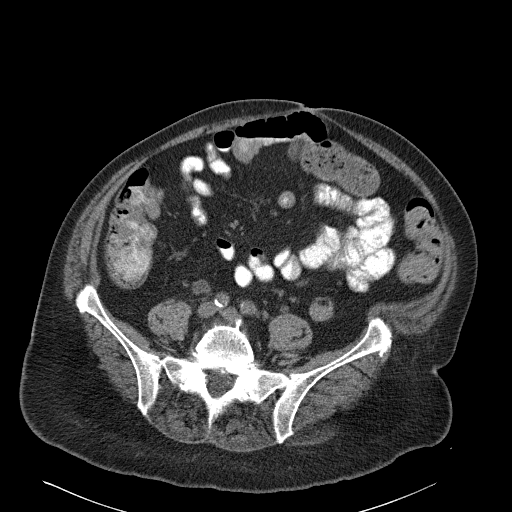
[im 43/89  soft-tissue]
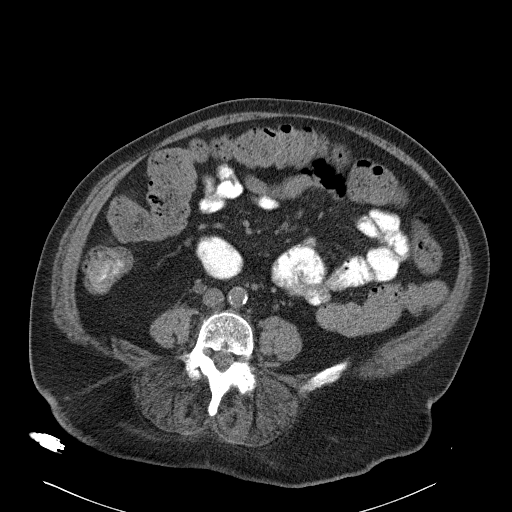
[im 46/89  soft-tissue]
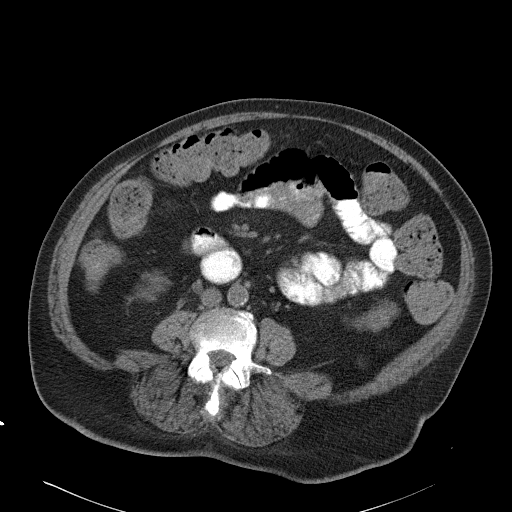
[im 53/89  soft-tissue]
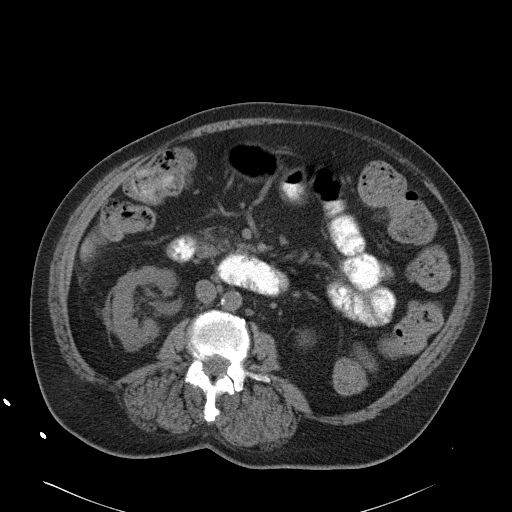
[im 53/89  bone]
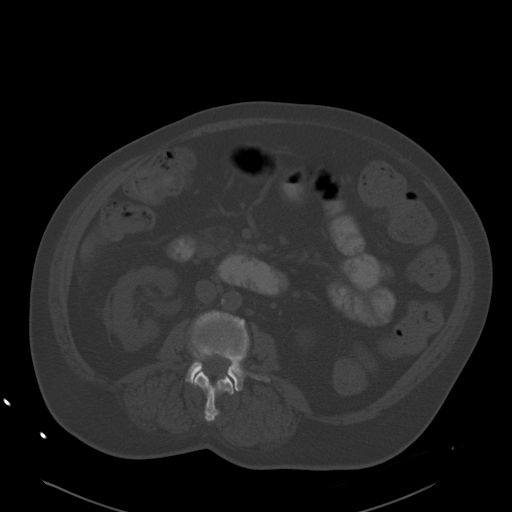
[im 60/89  soft-tissue]
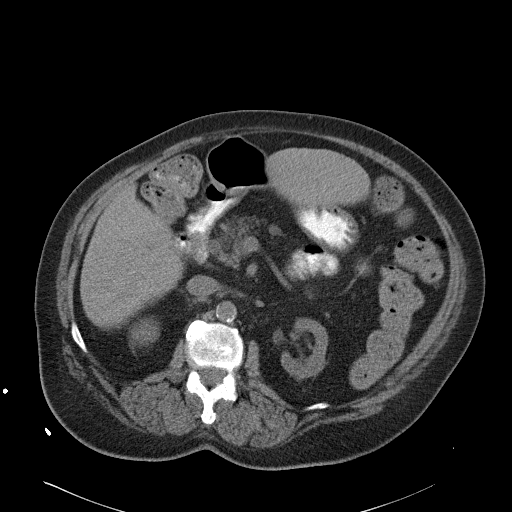
[im 67/89  soft-tissue]
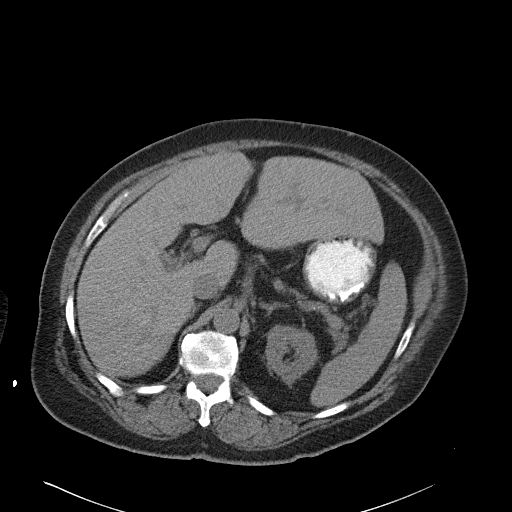
[im 71/89  soft-tissue]
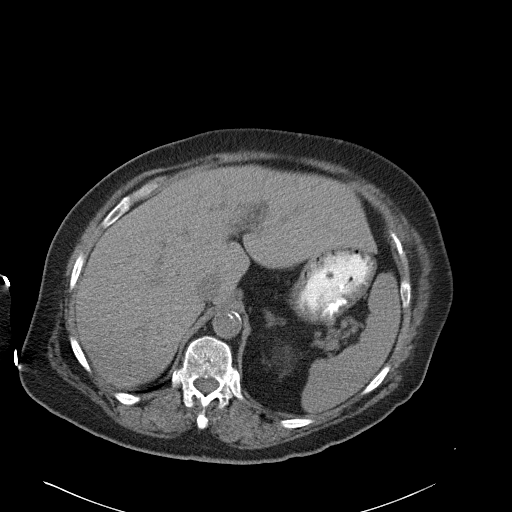
[im 78/89  soft-tissue]
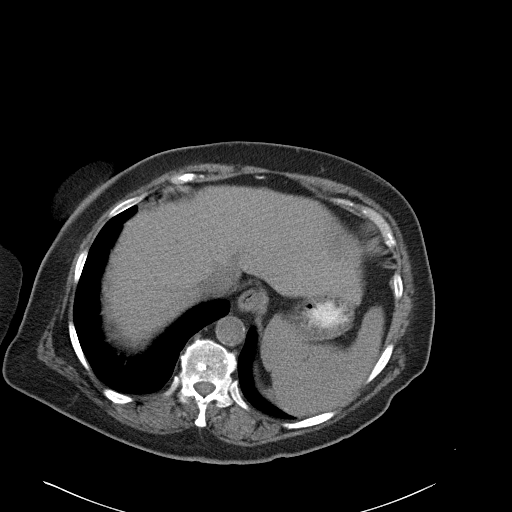
[im 85/89  soft-tissue]
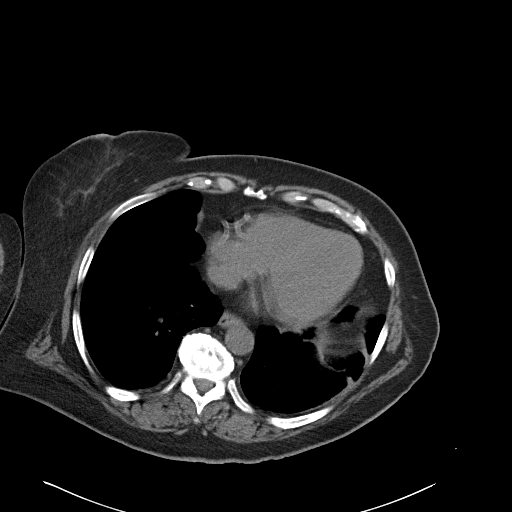

[Series 602: cor · coronal · 0.86mm/px · 3 of 89 slices shown]
[im 30/89  soft-tissue]
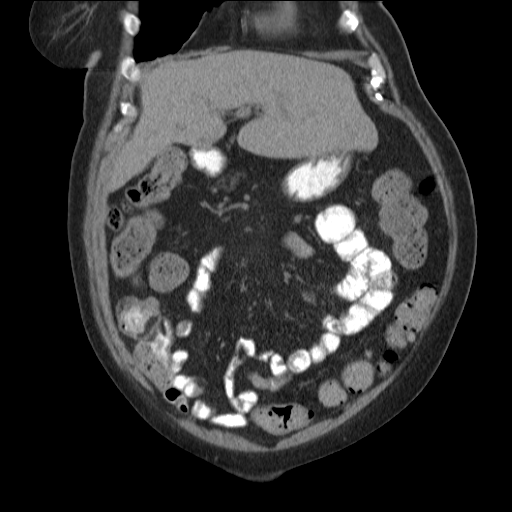
[im 40/89  soft-tissue]
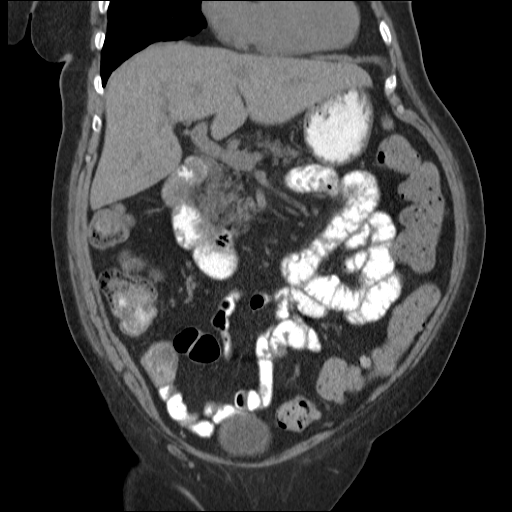
[im 49/89  soft-tissue]
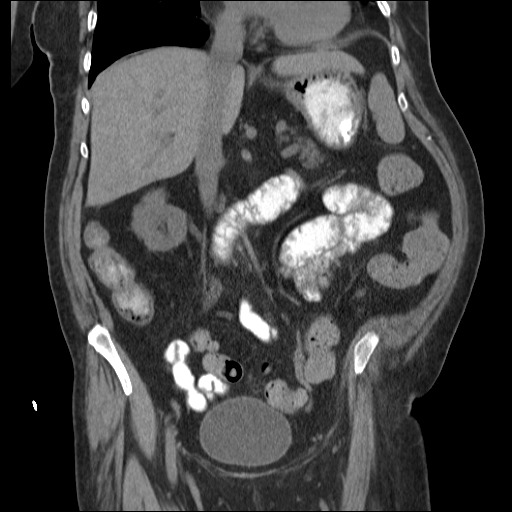

[17 of 46 positions shown; findings below may reference images not displayed]

FINDINGS: Normal unenhanced appearance of the liver, spleen,
adrenal glands, and left kidney.  Mild to moderate pancreatic
atrophy without focal pancreatic parenchymal abnormality.
Approximate 2.2 cm simple cyst arising from the lower pole of the
right kidney, unchanged; no significant abnormalities involving the
right kidney.  Gallbladder surgically absent.  No biliary ductal
dilation.  Moderate aorto-iliofemoral atherosclerosis without
aneurysm.  No significant lymphadenopathy.

Normal appearing stomach and small bowel.  Moderate stool burden in
the colon.  Scattered distal descending and sigmoid colon
diverticula.  Lipoma involving the ileocecal valve.  Appendix not
clearly visualized, but no pericecal inflammation.  No ascites.

Urinary bladder mildly distended and normal in appearance.  Uterus
surgically absent.  No adnexal masses or free pelvic fluid.
Phleboliths low in the pelvis.  Bone window images demonstrate
degenerative changes involving the lower thoracic and lumbar spine,
the sacroiliac joints, and the hips.  Scarring in the left lower
lobe and lingula; right lung base clear.  Heart mildly enlarged.
IMPRESSION: 1.  No acute abnormalities involving the abdomen or pelvis.
2.  Pancreatic atrophy.
3.  Moderate colonic stool burden.  Scattered distal descending
sigmoid colon diverticula without evidence of acute diverticulitis.

## 2014-03-17 IMAGING — CR DG CHEST 2V
2 series · 2 of 2 positions shown · non-contrast
Comparison: 03/02/2012

CLINICAL DATA: CHF, dyspnea and diabetes.  Former smoker.

CHEST - 2 VIEW

[w chest pa]
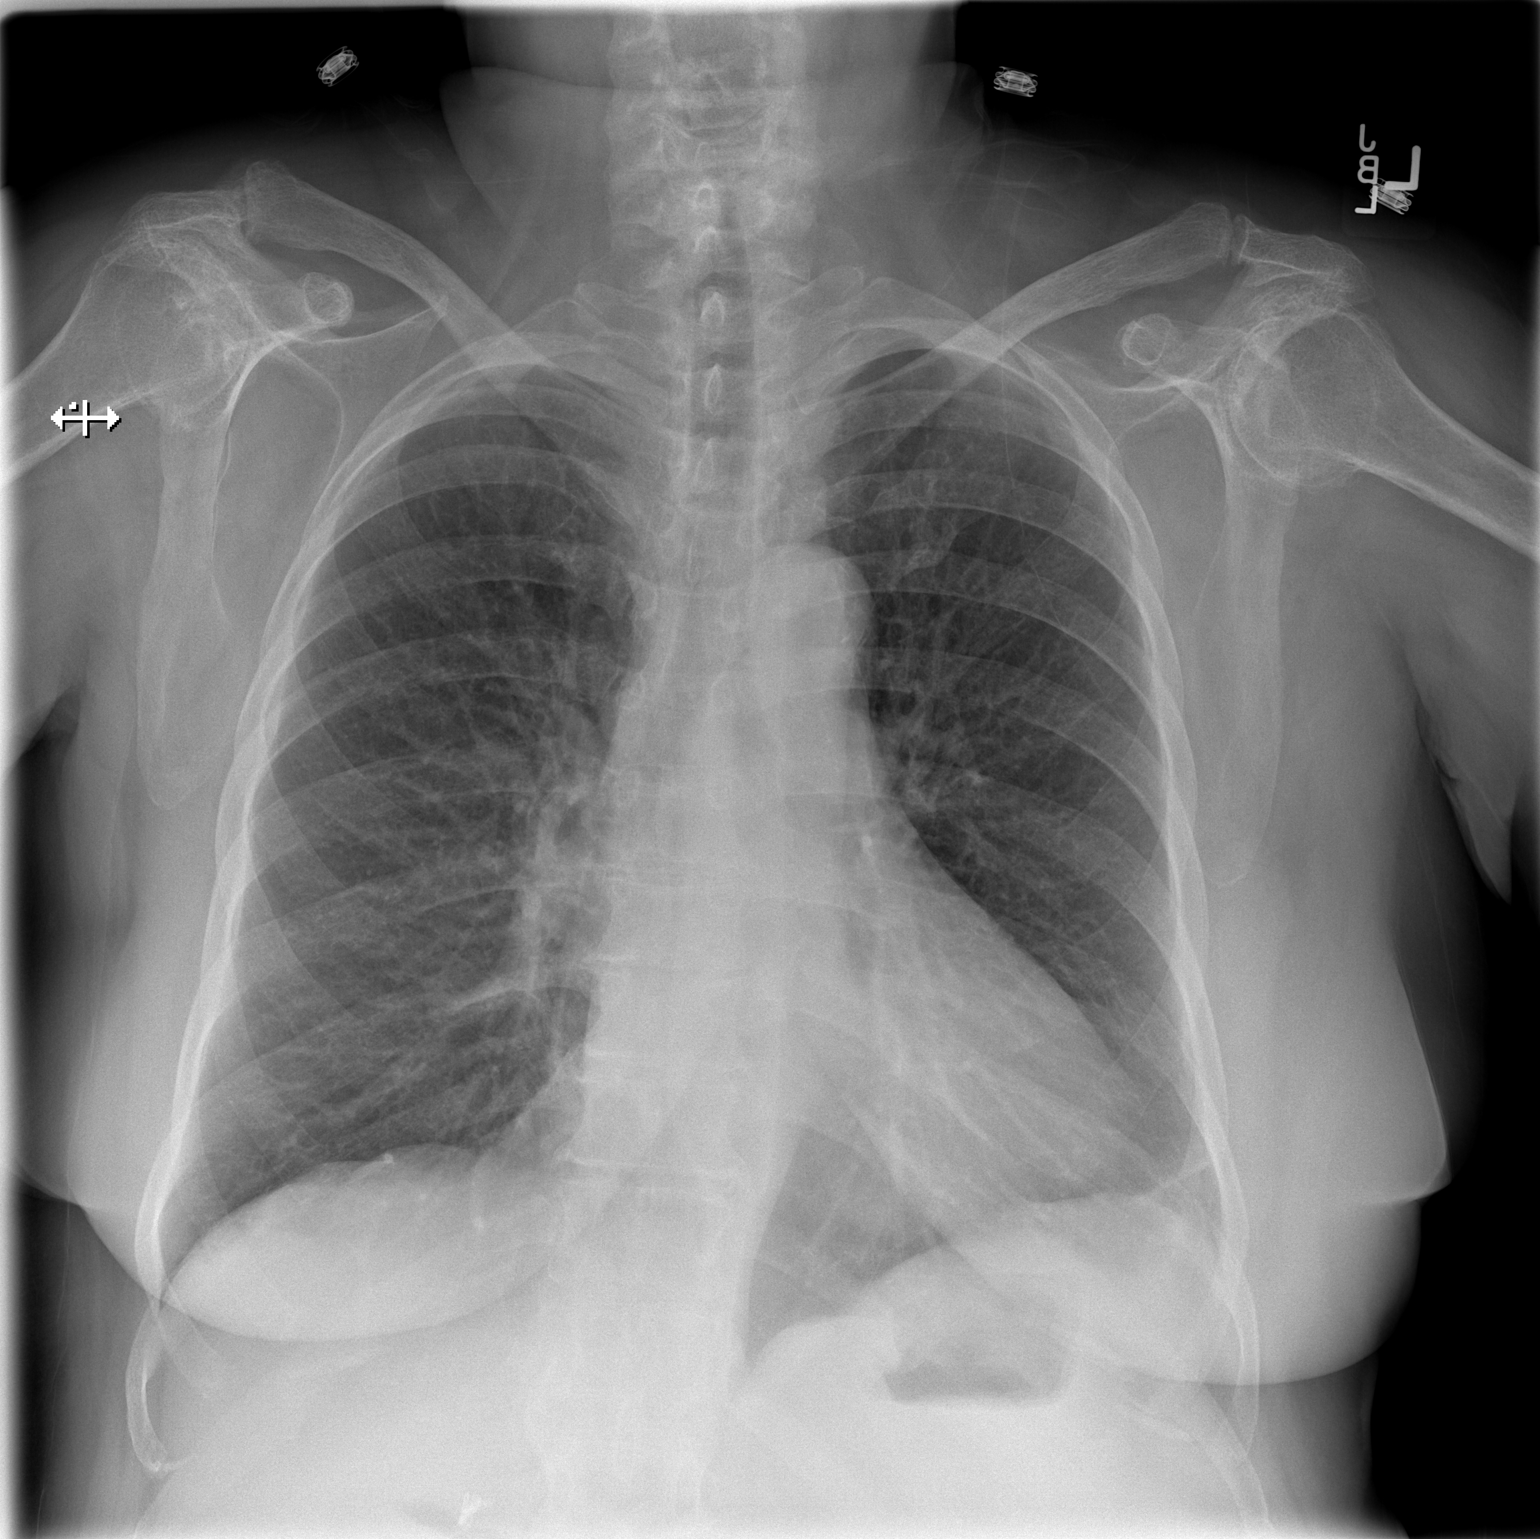

[w chest lat]
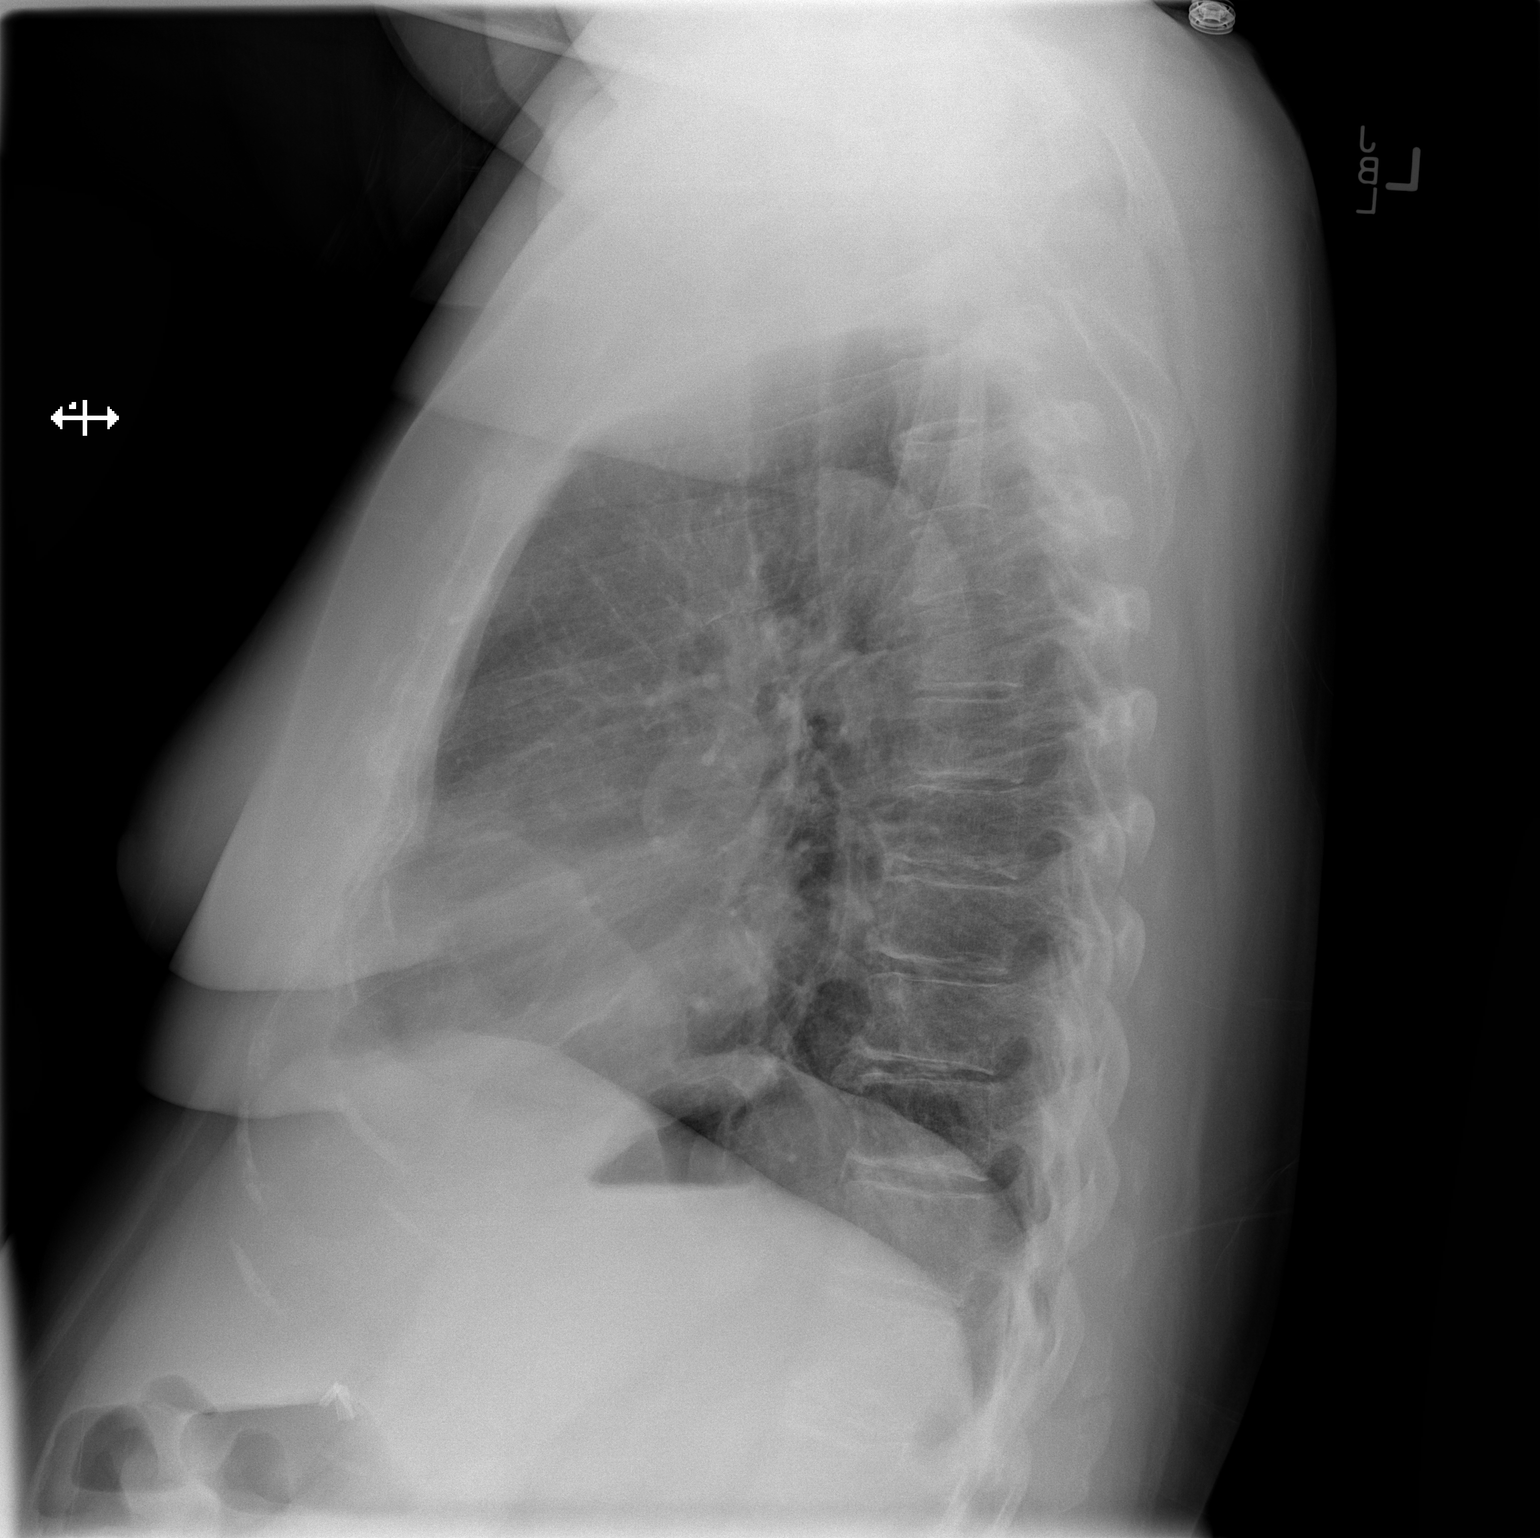

[2 of 2 positions shown; findings below may reference images not displayed]

FINDINGS: There is mild cardiac enlargement.  No pleural effusion or edema.
Scarring is noted in the left base.  No airspace consolidation.
Osteoarthritis is noted within both glenohumeral joints.  There is
mild degenerative disc disease within the lower thoracic spine.
IMPRESSION: 1.  No acute cardiopulmonary abnormalities.

## 2014-03-18 DIAGNOSIS — Z79899 Other long term (current) drug therapy: Secondary | ICD-10-CM | POA: Diagnosis not present

## 2014-03-27 ENCOUNTER — Encounter: Payer: Self-pay | Admitting: Diagnostic Neuroimaging

## 2014-03-27 ENCOUNTER — Ambulatory Visit (INDEPENDENT_AMBULATORY_CARE_PROVIDER_SITE_OTHER): Payer: Medicare Other | Admitting: Diagnostic Neuroimaging

## 2014-03-27 ENCOUNTER — Telehealth: Payer: Self-pay | Admitting: Hematology and Oncology

## 2014-03-27 VITALS — BP 127/64 | HR 75 | Ht 63.0 in | Wt 164.2 lb

## 2014-03-27 DIAGNOSIS — G459 Transient cerebral ischemic attack, unspecified: Secondary | ICD-10-CM

## 2014-03-27 DIAGNOSIS — I2510 Atherosclerotic heart disease of native coronary artery without angina pectoris: Secondary | ICD-10-CM

## 2014-03-27 DIAGNOSIS — I6502 Occlusion and stenosis of left vertebral artery: Secondary | ICD-10-CM | POA: Diagnosis not present

## 2014-03-27 NOTE — Telephone Encounter (Signed)
Faxed pt medical records to [facility] °

## 2014-03-27 NOTE — Progress Notes (Signed)
[facility]  PATIENT: Rebecca [name] DOB: [birth year]  REFERRING CLINICIAN:  HISTORY FROM: patient and daughter  REASON FOR VISIT: follow up   HISTORICAL  CHIEF COMPLAINT:  Chief Complaint  Patient presents with  . Follow-up    HISTORY OF PRESENT ILLNESS:   UPDATE 03/27/14: Since last visit, no new events. Memory loss is stable. Going to [facility] from 10a-2p 5 days a week, helping with respite care and mental/social stimulation.   UPDATE 01/21/14 (LL): Since last visit, Dr. Donnetta Hutching was consulted after hospital discharge and found no evidence of any carotid disease and very soft indication of any proximal vertebral disease. There was some confusion regarding left versus right regarding her brachial pressures. Dr. Donnetta Hutching did not see any indication that there was any suspicion for vertebrobasilar insufficiency in her workup. Repeat carotid dopplers in our office showed elevated RICA blood flow velocities but this finding was not supported by the presence of plaques. She has been maintained on Plavix daily without recurrent stroke or TIA symptoms. She has seen Rheumatologist in followup which had slightly increased her prednisone, she is on a slow taper for PMR. Recent Sed Rate was 13, which was much improved over 30 on 02/02/13. CRP was 6.4, previous last result in 09/27/12 was 3.2. She has been referred to Hematology for abnormal protein results found by Rheumatologist. B12 and TSH were normal. Since last visit with Dr. Leta Baptist, memory and mood concerns seemed to have stabilized. She has seen Dr. Casimiro Needle in followup.  PRIOR HPI (11/26/13): 73 year old female with heart disease, kidney disease, type 2 diabetes, schizoaffective disorder, history of TIAs, bladder problems, here for evaluation of multiple concerns by daughter: safety of coming off Plavix for epidural steroid injection, progressive cognitive decline, increasing headaches, throat pain, left-sided numbness. I previously saw  patient one time while rounding on the stroke service. Now patient presenting for evaluation of these new complaints. In summary patient was admitted from 10/16/13 until 10/18/13 due to sudden onset right-sided facial weakness and speech difficulty. Patient was admitted for TIA workup. MRI showed no acute stroke. Carotid ultrasound showed atypical waveform and left vertebral artery. On 10/18/13, I noted that there was asymmetry of blood pressure in the right versus left arm (Right arm BP 150/68; left arm BP 130/57). We considered CTA of the neck versus catheter angiogram, to look for subclavian stenosis, however this was deferred to outpatient basis as patient had renal insufficiency. Patient was discharged on Plavix with followup in stroke clinic. Since that time patient followed up with vascular surgery who did not feel that carotid ultrasound was significantly abnormal. They recommended medical management. Over past 3 weeks patient short-term memory problems have worsened. Patient has been having some intermittent memory problems for the past 2 years. Long-term memory is good. Patient also having more mood changes lately. She has not followed up with psychiatry lately. Also patient struggling with chronic pain. She was diagnosed with polymyalgia rheumatica 2 years ago and has been treated with steroids. Steroids have been tapered over last few months. She is scheduled to see rheumatology later this month. Patient also was recommended to consider epidural steroid injection. However, this would require coming off of Plavix for 5 days before the injection. Patient's daughter was concerned about increased stroke risk during this time. Currently patient is not in significant pain. However she takes oral pain medications which help. Patient having more frontal headaches lately.  REVIEW OF SYSTEMS: Full 14 system review of systems performed and  notable only for memory loss headache insomnia snoring decreased energy joint  pain aching muscles easy bruising easy bleeding.  ALLERGIES: No Known Allergies  HOME MEDICATIONS: Outpatient Prescriptions Prior to Visit  Medication Sig Dispense Refill  . acetaminophen (TYLENOL) 500 MG tablet Take 1,000 mg by mouth every 6 (six) hours as needed (pain). 2 tablets every morning and then 6 hours as needed      . albuterol (PROVENTIL HFA;VENTOLIN HFA) 108 (90 BASE) MCG/ACT inhaler Inhale 2 puffs into the lungs every 6 (six) hours as needed for wheezing or shortness of breath.      Marland Kitchen alendronate (FOSAMAX) 70 MG tablet Take 1 tablet (70 mg total) by mouth every 7 (seven) days. Take with a full glass of water on an empty stomach.  4 tablet  5  . cetirizine (ZYRTEC) 10 MG tablet Take 10 mg by mouth at bedtime.       . clopidogrel (PLAVIX) 75 MG tablet Take 1 tablet (75 mg total) by mouth daily with breakfast.  30 tablet  0  . clozapine (CLOZARIL) 200 MG tablet Take 300 mg by mouth at bedtime. Take 1.5 tablets (300 mg) at bedtime      . Cranberry (SM CRANBERRY) 300 MG tablet Take 300 mg by mouth at bedtime.       Marland Kitchen diltiazem (DILACOR XR) 180 MG 24 hr capsule Take 180 mg by mouth daily.      Marland Kitchen docusate sodium (COLACE) 100 MG capsule Take 200 mg by mouth at bedtime.   10 capsule  0  . ferrous sulfate 325 (65 FE) MG EC tablet Take 325 mg by mouth 2 (two) times daily.        . Fluticasone-Salmeterol (ADVAIR) 250-50 MCG/DOSE AEPB Inhale 1 puff into the lungs at bedtime.      . isosorbide mononitrate (IMDUR) 60 MG 24 hr tablet Take 1 tablet (60 mg total) by mouth daily.  30 tablet  6  . levothyroxine (SYNTHROID, LEVOTHROID) 50 MCG tablet Take 50 mcg by mouth daily before breakfast.      . metoprolol succinate (TOPROL-XL) 100 MG 24 hr tablet Take 100 mg by mouth daily. Take with or immediately following a meal.      . Multiple Vitamin (MULTIVITAMIN WITH MINERALS) TABS Take 1 tablet by mouth at bedtime. Psychiatrist      . nitroGLYCERIN (NITROSTAT) 0.4 MG SL tablet Place 1 tablet (0.4  mg total) under the tongue every 5 (five) minutes as needed. For chest pain  25 tablet  3  . Omega-3 Fatty Acids (FISH OIL) 1200 MG CAPS Take 1-2 capsules by mouth at bedtime.       . pantoprazole (PROTONIX) 40 MG tablet Take 1 tablet (40 mg total) by mouth daily.  30 tablet  11  . Polyethyl Glycol-Propyl Glycol (SYSTANE OP) Apply 1 drop to eye 2 (two) times daily as needed (dry eyes).      . polyethylene glycol (MIRALAX / GLYCOLAX) packet Take 17 g by mouth daily as needed (constipation).      . pravastatin (PRAVACHOL) 20 MG tablet Take 20 mg by mouth at bedtime.      . predniSONE (DELTASONE) 1 MG tablet Take 7 mg by mouth daily with breakfast.       . Pseudoephedrine-Guaifenesin (GUAIFEN-PSE PO) Take 600 mg by mouth daily as needed (congestion).       . temazepam (RESTORIL) 15 MG capsule Take 15 mg by mouth at bedtime as needed.       Marland Kitchen  tiotropium (SPIRIVA) 18 MCG inhalation capsule Place 18 mcg into inhaler and inhale daily.      . traMADol (ULTRAM) 50 MG tablet Take 50-100 mg by mouth 2 (two) times daily as needed for severe pain.      . Vitamin D, Ergocalciferol, (DRISDOL) 50000 UNITS CAPS capsule Take 1 capsule (50,000 Units total) by mouth every 7 (seven) days.  4 capsule  0  . ciprofloxacin (CIPRO) 250 MG tablet Take 250 mg by mouth 2 (two) times daily.      . tamsulosin (FLOMAX) 0.4 MG CAPS capsule Take 0.4 mg by mouth.       No facility-administered medications prior to visit.    PAST MEDICAL HISTORY: Past Medical History  Diagnosis Date  . Diabetes mellitus   . Frozen shoulder     right  . Obesity   . Renal insufficiency   . COPD (chronic obstructive pulmonary disease)   . Shortness of breath   . Stroke   . CAD (coronary artery disease) 2006    Taxus stent of mid RCA. negative myoview 2014 and normal LV function  . Diastolic heart failure, NYHA class 1   . Polymyalgia rheumatica   . 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 Partial hysterectomy  1995  . Abdominal hysterectomy    . Cholecystectomy    . Coronary angioplasty with stent placement  2006    Taxus DES to RCA, 50 % residual    FAMILY HISTORY: Family History  Problem Relation Age of Onset  . Diabetes Mother   . Heart disease Mother   . Heart disease Father   . Arthritis Father   . Healthy Sister   . Heart disease Brother   . Cancer Brother     colon ca  . Osteoporosis Sister   . Heart disease Brother   . Diabetes Brother   . Heart disease Brother   . Diabetes Brother   . Diabetes Brother   . Healthy Brother   . Healthy Brother   . Healthy Brother   . Cancer Maternal Uncle     blood condition  . Cancer Daughter     breast ca  . Cancer Cousin     [employer]    SOCIAL HISTORY:  History   Social History  . Marital Status: Divorced    Spouse Name: N/A    Number of Children: 3  . Years of Education: 9th   Occupational History  . homemaker    Social History Main Topics  . Smoking status: Former Smoker -- 1.00 packs/day for 35 years    Quit date: 04/14/2004  . Smokeless tobacco: Never Used  . Alcohol Use: No  . Drug Use: No  . Sexual Activity: Not on file   Other Topics Concern  . Not on file   Social History Narrative   Daughter [name] and son are very involved.  [name] comes to most office visits.   Recent resident at [facility]; daughter unhappy with care and taking her to live at home, after Jan 30th. Daughter is HCPOA, not averse to SNF/rehab in short-term, but does not wish pt to return to [facility] and requests that pt/pt's care not be discussed by [facility] providers to [facility], moving forward.   Caffeine Use: 1-3 cups daily     PHYSICAL EXAM  Filed Vitals:   03/27/14 1354  BP: 127/64  Pulse: 75  Height: 5\' 3"  (1.6 m)  Weight: 164 lb 3.2 oz (74.481 kg)    Not recorded    Body mass index is 29.09 kg/(m^2).  GENERAL  EXAM: Patient is in no distress; well developed, nourished and groomed; neck is supple  CARDIOVASCULAR: Regular rate and rhythm, no murmurs, no carotid bruits; BILATERAL RADIAL PULSES SYMM  NEUROLOGIC: MENTAL STATUS: PLEASANT, CALM; awake, alert, oriented to person, place and time, recent and remote memory intact, normal attention and concentration, language fluent, comprehension intact, naming intact, fund of knowledge appropriate CRANIAL NERVE: pupils equal and reactive to light, visual fields full to confrontation, extraocular muscles intact, no nystagmus, facial sensation and strength symmetric, HEARING DECREASED, palate elevates symmetrically, uvula midline, shoulder shrug symmetric, tongue midline. MOTOR: normal bulk and tone, full strength in the BUE, BLE SENSORY: normal and symmetric to light touch, pinprick, temperature; VIB < 5 SEC AT TOES COORDINATION: finger-nose-finger, fine finger movements normal REFLEXES: BUE 1, KNEES TRACE, ANKLES ABSENT GAIT/STATION: narrow based gait; SLOW AND CAUTIOUS, DECR ARM SWING   DIAGNOSTIC DATA (LABS, IMAGING, TESTING) - I reviewed patient records, labs, notes, testing and imaging myself where available.  Lab Results  Component Value Date   WBC 14.7* 10/16/2013   HGB 9.9* 10/16/2013   HCT 29.0* 10/16/2013   MCV 91.8 10/16/2013   PLT 185 10/16/2013      Component Value Date/Time   NA 131* 10/16/2013 1553   NA 137 06/21/2012   K 4.5 10/16/2013 1553   CL 93* 10/16/2013 1553   CO2 24 10/16/2013 1553   GLUCOSE 131* 10/16/2013 1553   BUN 37* 10/16/2013 1553   BUN 33* 06/21/2012   CREATININE 1.68* 10/16/2013 2027   CREATININE 1.85* 07/16/2013 1450   CREATININE 1.6* 06/21/2012   CALCIUM 10.0 10/16/2013 1553   CALCIUM 10.0 03/21/2008 2229   PROT 6.5 10/16/2013 1553   ALBUMIN 3.2* 10/16/2013 1553   AST 15 10/16/2013 1553   ALT 19 10/16/2013 1553   ALKPHOS 39 10/16/2013 1553   BILITOT 0.3 10/16/2013 1553   GFRNONAA 29* 10/16/2013 2027   GFRAA 33* 10/16/2013 2027   Lab Results   Component Value Date   CHOL 122 10/17/2013   [name] 65 10/17/2013   [name] 41 10/17/2013   LDLDIRECT 88 09/05/2007   [name] 80 10/17/2013   [name] 1.9 10/17/2013   Lab Results  Component Value Date   HGBA1C 5.7* 10/17/2013   Lab Results  Component Value Date   [id number] 5809* 11/26/2013   Lab Results  Component Value Date   TSH 1.490 11/26/2013    10/16/13 MRI brain / MRA head: 1. No evidence of acute intracranial abnormality.  2. Mild to moderate chronic small vessel ischemic disease and cerebral atrophy.  3. No evidence of major intracranial arterial occlusion or significant stenosis.  10/17/13 Carotid u/s: Brachial pressures:                RightLeft +---------+-----+----+ Systolic 983 382  +---------+-----+----+ Diastolic66   78    +---------+-----+----+ - 1-39% internal carotid artery stenosis bilaterally. The right vertebral artery is patent with antegrade flow. The left vertebral artery is patent with antegrade flow, however the waveform is mildly atypical. This along with the 28mmHg gradient between brachial arteries is suggestive of a possible developing proximal obstruction.   11/26/13 TTE:  - Left ventricle: The cavity size was normal. There was moderate concentric hypertrophy. Systolic function was normal. The estimated ejection fraction was in the range of 60% to 65%. Wall motion was normal;  there were no regional wall motion abnormalities. There was an increased relative contribution of atrial contraction to ventricular filling. Doppler parameters are consistent with abnormal left ventricular relaxation (grade 1 diastolic dysfunction). - Aortic valve: Trivial regurgitation.   ASSESSMENT AND PLAN  73 y.o. year old female here with type 2 diabetes, heart disease, kidney disease, schizoaffective disorder, TIAs, here for follow up of several issues:  1. Regarding history of polymyalgia rheumatica, agree with slow taper per rheumatology.  2. Regarding patient's memory  problems, this is likely related to her underlying chronic pain, chronic psychiatric issues, sleep disorder. PRIOR MMSE 28/30 on 11/26/13. No frontal release signs. MRI brain does not show any significant abnormality to relate to the memory issues. Will monitor.  3. Regarding left vertebral artery abnormality on prior carotid ultrasound, this may be artifactual. I will repeat carotid ultrasound for evaluation in Feb 2016.  4. Regarding TIAs: Continue clopidogrel 75 mg daily; BP, diabetes and lipid control per PCP  PLAN:  Orders Placed This Encounter  Procedures  . US Carotid Bilateral   Return in about 4 months (around 07/28/2014).   Penni Bombard, MD 09/38/1829, 9:37 PM Certified in Neurology, Neurophysiology and Neuroimaging  [facility] [address] [place], [place] [address] [phone]

## 2014-04-15 ENCOUNTER — Ambulatory Visit (HOSPITAL_BASED_OUTPATIENT_CLINIC_OR_DEPARTMENT_OTHER): Payer: Medicare Other

## 2014-04-15 ENCOUNTER — Ambulatory Visit (HOSPITAL_COMMUNITY)
Admission: RE | Admit: 2014-04-15 | Discharge: 2014-04-15 | Disposition: A | Discharge status: Home / Self Care | Payer: Medicare Other | Source: Ambulatory Visit | Attending: Diagnostic Radiology | Admitting: Diagnostic Radiology

## 2014-04-15 DIAGNOSIS — M25551 Pain in right hip: Secondary | ICD-10-CM | POA: Diagnosis not present

## 2014-04-15 DIAGNOSIS — N182 Chronic kidney disease, stage 2 (mild): Secondary | ICD-10-CM

## 2014-04-15 DIAGNOSIS — D472 Monoclonal gammopathy: Secondary | ICD-10-CM | POA: Insufficient documentation

## 2014-04-15 DIAGNOSIS — G8929 Other chronic pain: Secondary | ICD-10-CM | POA: Insufficient documentation

## 2014-04-15 DIAGNOSIS — M47819 Spondylosis without myelopathy or radiculopathy, site unspecified: Secondary | ICD-10-CM | POA: Insufficient documentation

## 2014-04-15 DIAGNOSIS — M7501 Adhesive capsulitis of right shoulder: Secondary | ICD-10-CM | POA: Insufficient documentation

## 2014-04-15 DIAGNOSIS — M25552 Pain in left hip: Secondary | ICD-10-CM | POA: Diagnosis not present

## 2014-04-15 DIAGNOSIS — M810 Age-related osteoporosis without current pathological fracture: Secondary | ICD-10-CM | POA: Insufficient documentation

## 2014-04-15 DIAGNOSIS — M542 Cervicalgia: Secondary | ICD-10-CM | POA: Diagnosis not present

## 2014-04-15 DIAGNOSIS — J449 Chronic obstructive pulmonary disease, unspecified: Secondary | ICD-10-CM | POA: Diagnosis not present

## 2014-04-15 DIAGNOSIS — M898X5 Other specified disorders of bone, thigh: Secondary | ICD-10-CM | POA: Insufficient documentation

## 2014-04-15 LAB — CBC WITH DIFFERENTIAL/PLATELET
BASO%: 0.2 % (ref 0.0–2.0)
Basophils Absolute: 0 10*3/uL (ref 0.0–0.1)
EOS ABS: 0.3 10*3/uL (ref 0.0–0.5)
EOS%: 2.2 % (ref 0.0–7.0)
HEMATOCRIT: 33 % — AB (ref 34.8–46.6)
HGB: 10.7 g/dL — ABNORMAL LOW (ref 11.6–15.9)
LYMPH#: 2 10*3/uL (ref 0.9–3.3)
LYMPH%: 15 % (ref 14.0–49.7)
MCH: 30.2 pg (ref 25.1–34.0)
MCHC: 32.4 g/dL (ref 31.5–36.0)
MCV: 93.2 fL (ref 79.5–101.0)
MONO#: 0.8 10*3/uL (ref 0.1–0.9)
MONO%: 6.1 % (ref 0.0–14.0)
NEUT%: 76.5 % (ref 38.4–76.8)
NEUTROS ABS: 10.2 10*3/uL — AB (ref 1.5–6.5)
PLATELETS: 206 10*3/uL (ref 145–400)
RBC: 3.54 10*6/uL — AB (ref 3.70–5.45)
RDW: 12.9 % (ref 11.2–14.5)
WBC: 13.4 10*3/uL — AB (ref 3.9–10.3)

## 2014-04-15 LAB — COMPREHENSIVE METABOLIC PANEL (CC13)
ALBUMIN: 3.6 g/dL (ref 3.5–5.0)
ALT: 29 U/L (ref 0–55)
ANION GAP: 9 meq/L (ref 3–11)
AST: 17 U/L (ref 5–34)
Alkaline Phosphatase: 37 U/L — ABNORMAL LOW (ref 40–150)
BILIRUBIN TOTAL: 0.4 mg/dL (ref 0.20–1.20)
BUN: 37.6 mg/dL — ABNORMAL HIGH (ref 7.0–26.0)
CO2: 25 meq/L (ref 22–29)
Calcium: 10.2 mg/dL (ref 8.4–10.4)
Chloride: 100 mEq/L (ref 98–109)
Creatinine: 1.9 mg/dL — ABNORMAL HIGH (ref 0.6–1.1)
GLUCOSE: 122 mg/dL (ref 70–140)
Potassium: 5.7 mEq/L — ABNORMAL HIGH (ref 3.5–5.1)
Sodium: 135 mEq/L — ABNORMAL LOW (ref 136–145)
Total Protein: 6.7 g/dL (ref 6.4–8.3)

## 2014-04-15 LAB — LACTATE DEHYDROGENASE (CC13): LDH: 180 U/L (ref 125–245)

## 2014-04-17 LAB — BETA 2 MICROGLOBULIN, SERUM: BETA 2 MICROGLOBULIN: 6.37 mg/L — AB (ref <=2.51)

## 2014-04-17 LAB — SPEP & IFE WITH QIG
ALBUMIN ELP: 58.9 % (ref 55.8–66.1)
ALPHA-1-GLOBULIN: 5.5 % — AB (ref 2.9–4.9)
ALPHA-2-GLOBULIN: 13.4 % — AB (ref 7.1–11.8)
BETA GLOBULIN: 6 % (ref 4.7–7.2)
Beta 2: 5.1 % (ref 3.2–6.5)
GAMMA GLOBULIN: 11.1 % (ref 11.1–18.8)
IgA: 129 mg/dL (ref 69–380)
IgG (Immunoglobin G), Serum: 670 mg/dL — ABNORMAL LOW (ref 690–1700)
IgM, Serum: 56 mg/dL (ref 52–322)
M-Spike, %: 0.18 g/dL
Total Protein, Serum Electrophoresis: 6.4 g/dL (ref 6.0–8.3)

## 2014-04-17 LAB — KAPPA/LAMBDA LIGHT CHAINS
KAPPA LAMBDA RATIO: 1.87 — AB (ref 0.26–1.65)
Kappa free light chain: 3.93 mg/dL — ABNORMAL HIGH (ref 0.33–1.94)
Lambda Free Lght Chn: 2.1 mg/dL (ref 0.57–2.63)

## 2014-04-19 ENCOUNTER — Telehealth: Payer: Self-pay | Admitting: *Deleted

## 2014-04-19 ENCOUNTER — Telehealth: Payer: Self-pay | Admitting: Hematology and Oncology

## 2014-04-19 DIAGNOSIS — Z79899 Other long term (current) drug therapy: Secondary | ICD-10-CM | POA: Diagnosis not present

## 2014-04-19 NOTE — Telephone Encounter (Signed)
Daughter states pt had her blood work done earlier this week but had been unable to get the 24 hr urine collection yet.  She plans on doing it this weekend and bringing the jug in on Monday.  Pt has appt w/ [name] Monday 11/09.  Daughter asks if pt needs to r/s this appt or keep as scheduled?

## 2014-04-19 NOTE — Telephone Encounter (Signed)
Informed daughter appt will be r/s to Monday 11/23.  She requests afternoon after 2 pm as pt goes to adult daycare until 2 pm.  Informed her of appt at 3 pm and request sent to scheduler.  She verbalized understanding.

## 2014-04-19 NOTE — Telephone Encounter (Signed)
PER 11/6 POF MOVED F/U APPT FROM 11/9 TO 11/23 @ 3PM - PER POF DTR AWARE

## 2014-04-19 NOTE — Telephone Encounter (Signed)
Please reschedule to 11/23. 15 minutes appt

## 2014-04-22 ENCOUNTER — Encounter: Payer: Self-pay | Admitting: Internal Medicine

## 2014-04-22 ENCOUNTER — Ambulatory Visit: Payer: Self-pay | Admitting: Hematology and Oncology

## 2014-04-29 DIAGNOSIS — E785 Hyperlipidemia, unspecified: Secondary | ICD-10-CM | POA: Diagnosis not present

## 2014-04-29 DIAGNOSIS — E039 Hypothyroidism, unspecified: Secondary | ICD-10-CM | POA: Diagnosis not present

## 2014-04-29 DIAGNOSIS — L989 Disorder of the skin and subcutaneous tissue, unspecified: Secondary | ICD-10-CM | POA: Diagnosis not present

## 2014-04-29 DIAGNOSIS — I10 Essential (primary) hypertension: Secondary | ICD-10-CM | POA: Diagnosis not present

## 2014-04-29 DIAGNOSIS — N182 Chronic kidney disease, stage 2 (mild): Secondary | ICD-10-CM | POA: Diagnosis not present

## 2014-04-29 DIAGNOSIS — E119 Type 2 diabetes mellitus without complications: Secondary | ICD-10-CM | POA: Diagnosis not present

## 2014-04-29 DIAGNOSIS — N183 Chronic kidney disease, stage 3 (moderate): Secondary | ICD-10-CM | POA: Diagnosis not present

## 2014-04-29 DIAGNOSIS — D472 Monoclonal gammopathy: Secondary | ICD-10-CM | POA: Diagnosis not present

## 2014-04-29 DIAGNOSIS — M353 Polymyalgia rheumatica: Secondary | ICD-10-CM | POA: Diagnosis not present

## 2014-05-01 LAB — KAPPA/LAMBDA LIGHT CHAINS, FREE, WITH RATIO, 24HR. URINE
KAPPA LIGHT CHAIN, FREE U: 6.11 mg/L (ref 1.35–24.19)
Kappa/Lambda, Free Ratio: 12.47 — ABNORMAL HIGH (ref 2.04–10.37)
LAMBDA LIGHT CHAIN, FREE U: 0.49 mg/L (ref 0.24–6.66)

## 2014-05-06 ENCOUNTER — Encounter: Payer: Self-pay | Admitting: Hematology and Oncology

## 2014-05-06 ENCOUNTER — Ambulatory Visit (HOSPITAL_BASED_OUTPATIENT_CLINIC_OR_DEPARTMENT_OTHER): Payer: Medicare Other | Admitting: Hematology and Oncology

## 2014-05-06 ENCOUNTER — Telehealth: Payer: Self-pay | Admitting: Hematology and Oncology

## 2014-05-06 VITALS — BP 132/56 | HR 69 | Temp 97.3°F | Resp 21 | Ht 63.0 in | Wt 169.3 lb

## 2014-05-06 DIAGNOSIS — D472 Monoclonal gammopathy: Secondary | ICD-10-CM | POA: Diagnosis not present

## 2014-05-06 DIAGNOSIS — N183 Chronic kidney disease, stage 3 unspecified: Secondary | ICD-10-CM

## 2014-05-06 DIAGNOSIS — N189 Chronic kidney disease, unspecified: Secondary | ICD-10-CM | POA: Diagnosis not present

## 2014-05-06 DIAGNOSIS — D649 Anemia, unspecified: Secondary | ICD-10-CM

## 2014-05-06 DIAGNOSIS — D6489 Other specified anemias: Secondary | ICD-10-CM

## 2014-05-06 NOTE — Telephone Encounter (Signed)
gv adn printed appt sched and avs fo rpt for July 2016 °

## 2014-05-06 NOTE — Assessment & Plan Note (Signed)
This is likely anemia of chronic disease. The patient denies recent history of bleeding such as epistaxis, hematuria or hematochezia. She is asymptomatic from the anemia. We will observe for now.  

## 2014-05-06 NOTE — Assessment & Plan Note (Signed)
Clinically, she does not appear to have signs and symptoms to suggest multiple myeloma. 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 I would recommend rechecking her blood work and skeletal survey in 8 months. I spent a lot of time talking to her daughter the pathophysiology of monoclonal gammopathy and the natural history of disease progression. I addressed all her questions and concerns

## 2014-05-06 NOTE — Progress Notes (Signed)
[place] OFFICE PROGRESS NOTE  Patient Care Team: [name], MD as PCP - General (Internal Medicine) Louis Meckel, MD as Consulting Physician (Nephrology) Pixie Casino, MD as Consulting Physician (Cardiology)  SUMMARY OF ONCOLOGIC HISTORY: Rebecca [name] is here because of recently discovered IgG lambda MGUS. Patient herself appears to have some memory deficit. A lot of history was obtained through a review of chart and collaboration of history with her daughter She denies history of abnormal bone pain or bone fracture. The patient had history of polymyalgia rheumatica and had been on high-dose prednisone therapy in the past, currently on a slow prednisone taper. Patient denies history of recurrent infection or atypical infections such as shingles of meningitis. Denies chills, night sweats, anorexia or abnormal weight loss. She had chronic bruising from antiplatelet agent and prednisone therapy. She has occasional epistaxis but she thought to be related to the use of CPAP.  INTERVAL HISTORY: Please see below for problem oriented charting. She complained of easy bruising and mild leg pain. Denies recent bone fracture. Denies recent infection. She is currently on 6 mg of prednisone.  REVIEW OF SYSTEMS:   Constitutional: Denies fevers, chills or abnormal weight loss Eyes: Denies blurriness of vision Ears, nose, mouth, throat, and face: Denies mucositis or sore throat Respiratory: Denies cough, dyspnea or wheezes Cardiovascular: Denies palpitation, chest discomfort or lower extremity swelling Gastrointestinal:  Denies nausea, heartburn or change in bowel habits Skin: Denies abnormal skin rashes Lymphatics: Denies new lymphadenopathy  Neurological:Denies numbness, tingling or new weaknesses Behavioral/Psych: Mood is stable, no new changes  All other systems were reviewed with the patient and are negative.  I have reviewed the past medical history, past  surgical history, social history and family history with the patient and they are unchanged from previous note.  ALLERGIES:  has No Known Allergies.  MEDICATIONS:  Current Outpatient Prescriptions  Medication Sig Dispense Refill  . acetaminophen (TYLENOL) 500 MG tablet Take 1,000 mg by mouth every 6 (six) hours as needed (pain). 2 tablets every morning and then 6 hours as needed    . albuterol (PROVENTIL HFA;VENTOLIN HFA) 108 (90 BASE) MCG/ACT inhaler Inhale 2 puffs into the lungs every 6 (six) hours as needed for wheezing or shortness of breath.    Marland Kitchen alendronate (FOSAMAX) 70 MG tablet Take 1 tablet (70 mg total) by mouth every 7 (seven) days. Take with a full glass of water on an empty stomach. 4 tablet 5  . cetirizine (ZYRTEC) 10 MG tablet Take 10 mg by mouth at bedtime.     . clopidogrel (PLAVIX) 75 MG tablet Take 1 tablet (75 mg total) by mouth daily with breakfast. 30 tablet 0  . clozapine (CLOZARIL) 200 MG tablet Take 300 mg by mouth at bedtime. Take 1.5 tablets (300 mg) at bedtime    . Cranberry (SM CRANBERRY) 300 MG tablet Take 300 mg by mouth at bedtime.     Marland Kitchen diltiazem (DILACOR XR) 180 MG 24 hr capsule Take 180 mg by mouth daily.    Marland Kitchen docusate sodium (COLACE) 100 MG capsule Take 200 mg by mouth at bedtime.  10 capsule 0  . ferrous sulfate 325 (65 FE) MG EC tablet Take 325 mg by mouth 2 (two) times daily.      . Fluticasone-Salmeterol (ADVAIR) 250-50 MCG/DOSE AEPB Inhale 1 puff into the lungs at bedtime.    . isosorbide mononitrate (IMDUR) 60 MG 24 hr tablet Take 1 tablet (60 mg total) by mouth daily. [place]  tablet 6  . levothyroxine (SYNTHROID, LEVOTHROID) 75 MCG tablet Take 75 mcg by mouth daily before breakfast.    . metoprolol succinate (TOPROL-XL) 100 MG 24 hr tablet Take 100 mg by mouth daily. Take with or immediately following a meal.    . Multiple Vitamin (MULTIVITAMIN WITH MINERALS) TABS Take 1 tablet by mouth at bedtime. Psychiatrist    . nitroGLYCERIN (NITROSTAT) 0.4 MG SL  tablet Place 1 tablet (0.4 mg total) under the tongue every 5 (five) minutes as needed. For chest pain 25 tablet 3  . Omega-3 Fatty Acids (FISH OIL) 1200 MG CAPS Take 1-2 capsules by mouth at bedtime.     . pantoprazole (PROTONIX) 40 MG tablet Take 1 tablet (40 mg total) by mouth daily. 30 tablet 11  . Polyethyl Glycol-Propyl Glycol (SYSTANE OP) Apply 1 drop to eye 2 (two) times daily as needed (dry eyes).    . polyethylene glycol (MIRALAX / GLYCOLAX) packet Take 17 g by mouth daily as needed (constipation).    . pravastatin (PRAVACHOL) 20 MG tablet Take 20 mg by mouth at bedtime.    . predniSONE (DELTASONE) 1 MG tablet Take 6 mg by mouth daily with breakfast.     . Pseudoephedrine-Guaifenesin (GUAIFEN-PSE PO) Take 600 mg by mouth daily as needed (congestion).     . temazepam (RESTORIL) 15 MG capsule Take 15 mg by mouth at bedtime as needed.     . tiotropium (SPIRIVA) 18 MCG inhalation capsule Place 18 mcg into inhaler and inhale daily.    . traMADol (ULTRAM) 50 MG tablet Take 50-100 mg by mouth 2 (two) times daily as needed for severe pain.    . Vitamin D, Ergocalciferol, (DRISDOL) 50000 UNITS CAPS capsule Take 1 capsule (50,000 Units total) by mouth every 7 (seven) days. 4 capsule 0   No current facility-administered medications for this visit.    PHYSICAL EXAMINATION: ECOG PERFORMANCE STATUS: 1 - Symptomatic but completely ambulatory  Filed Vitals:   05/06/14 1506  BP: 132/56  Pulse: 69  Temp: 97.3 F (36.3 C)  Resp: 21   Filed Weights   05/06/14 1506  Weight: 169 lb 4.8 oz (76.794 kg)    GENERAL:alert, no distress and comfortable. She appears cushingoid SKIN: skin color, texture, turgor are normal, no rashes or significant lesions EYES: normal, Conjunctiva are pink and non-injected, sclera clear Musculoskeletal:no cyanosis of digits and no clubbing  NEURO: alert & oriented x 3 with fluent speech, no focal motor/sensory deficits  LABORATORY DATA:  I have reviewed the data as  listed    Component Value Date/Time   NA 135* 04/15/2014 1137   NA 131* 10/16/2013 1553   NA 137 06/21/2012   K 5.7* 04/15/2014 1137   K 4.5 10/16/2013 1553   CL 93* 10/16/2013 1553   CO2 25 04/15/2014 1137   CO2 24 10/16/2013 1553   GLUCOSE 122 04/15/2014 1137   GLUCOSE 131* 10/16/2013 1553   BUN 37.6* 04/15/2014 1137   BUN 37* 10/16/2013 1553   BUN 33* 06/21/2012   CREATININE 1.9* 04/15/2014 1137   CREATININE 1.68* 10/16/2013 2027   CREATININE 1.85* 07/16/2013 1450   CREATININE 1.6* 06/21/2012   CALCIUM 10.2 04/15/2014 1137   CALCIUM 10.0 10/16/2013 1553   CALCIUM 10.0 03/21/2008 2229   PROT 6.7 04/15/2014 1137   PROT 6.5 10/16/2013 1553   ALBUMIN 3.6 04/15/2014 1137   ALBUMIN 3.2* 10/16/2013 1553   AST 17 04/15/2014 1137   AST 15 10/16/2013 1553   ALT 29  04/15/2014 1137   ALT 19 10/16/2013 1553   ALKPHOS 37* 04/15/2014 1137   ALKPHOS 39 10/16/2013 1553   BILITOT 0.40 04/15/2014 1137   BILITOT 0.3 10/16/2013 1553   GFRNONAA 29* 10/16/2013 2027   GFRAA 33* 10/16/2013 2027    No results found for: SPEP, UPEP  Lab Results  Component Value Date   WBC 13.4* 04/15/2014   NEUTROABS 10.2* 04/15/2014   HGB 10.7* 04/15/2014   HCT 33.0* 04/15/2014   MCV 93.2 04/15/2014   PLT 206 04/15/2014      Chemistry      Component Value Date/Time   NA 135* 04/15/2014 1137   NA 131* 10/16/2013 1553   NA 137 06/21/2012   K 5.7* 04/15/2014 1137   K 4.5 10/16/2013 1553   CL 93* 10/16/2013 1553   CO2 25 04/15/2014 1137   CO2 24 10/16/2013 1553   BUN 37.6* 04/15/2014 1137   BUN 37* 10/16/2013 1553   BUN 33* 06/21/2012   CREATININE 1.9* 04/15/2014 1137   CREATININE 1.68* 10/16/2013 2027   CREATININE 1.85* 07/16/2013 1450   CREATININE 1.6* 06/21/2012   GLU 77 06/21/2012      Component Value Date/Time   CALCIUM 10.2 04/15/2014 1137   CALCIUM 10.0 10/16/2013 1553   CALCIUM 10.0 03/21/2008 2229   ALKPHOS 37* 04/15/2014 1137   ALKPHOS 39 10/16/2013 1553   AST 17 04/15/2014  1137   AST 15 10/16/2013 1553   ALT 29 04/15/2014 1137   ALT 19 10/16/2013 1553   BILITOT 0.40 04/15/2014 1137   BILITOT 0.3 10/16/2013 1553       RADIOGRAPHIC STUDIES: I reviewed the skeletal survey with the patient and daughter. I have personally reviewed the radiological images as listed and agreed with the findings in the report.  ASSESSMENT & PLAN:  MGUS (monoclonal gammopathy of unknown significance) Clinically, she does not appear to have signs and symptoms to suggest multiple myeloma. 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 I would recommend rechecking her blood work and skeletal survey in 8 months. I spent a lot of time talking to her daughter the pathophysiology of monoclonal gammopathy and the natural history of disease progression. I addressed all her questions and concerns    Anemia This is likely anemia of chronic disease. The patient denies recent history of bleeding such as epistaxis, hematuria or hematochezia. She is asymptomatic from the anemia. We will observe for now.   Chronic kidney disease This is unlikely due to MGUS. She will continue followup with a nephrologist   Orders Placed This Encounter  Procedures  . DG Bone Survey Met    Standing Status: Future     Number of Occurrences:      Standing Expiration Date: 07/06/2015    Order Specific Question:  Reason for Exam (SYMPTOM  OR DIAGNOSIS REQUIRED)    Answer:  staging myeloma    Order Specific Question:  Preferred imaging location?    Answer:  [facility]  . Comprehensive metabolic panel    Standing Status: Future     Number of Occurrences:      Standing Expiration Date: 07/06/2015  . CBC with Differential    Standing Status: Future     Number of Occurrences:      Standing Expiration Date: 07/06/2015  . SPEP & IFE with QIG    Standing Status: Future     Number of Occurrences:      Standing Expiration Date: 07/06/2015  . Kappa/lambda light chains  Standing Status:  Future     Number of Occurrences:      Standing Expiration Date: 07/06/2015  . Beta 2 microglobulin, serum    Standing Status: Future     Number of Occurrences:      Standing Expiration Date: 07/06/2015  . IFE, Urine (with Tot Prot)    Standing Status: Future     Number of Occurrences:      Standing Expiration Date: 07/06/2015   All questions were answered. The patient knows to call the clinic with any problems, questions or concerns. No barriers to learning was detected. I spent 25 minutes counseling the patient face to face. The total time spent in the appointment was 30 minutes and more than 50% was on counseling and review of test results     [facility], [place], MD 05/06/2014 4:06 PM

## 2014-05-06 NOTE — Assessment & Plan Note (Signed)
This is unlikely due to MGUS. She will continue followup with a nephrologist   

## 2014-05-07 ENCOUNTER — Encounter: Payer: Self-pay | Admitting: Hematology and Oncology

## 2014-05-16 DIAGNOSIS — Z79899 Other long term (current) drug therapy: Secondary | ICD-10-CM | POA: Diagnosis not present

## 2014-05-16 LAB — UIFE/LIGHT CHAINS/TP QN, 24-HR UR
ALPHA 1 UR: DETECTED — AB
ALPHA 2 UR: DETECTED — AB
Albumin, U: DETECTED
Beta, Urine: DETECTED — AB
GAMMA UR: DETECTED — AB
Time: 24 hours
Total Protein, Urine-Ur/day: 945 mg/d — ABNORMAL HIGH (ref <150)
Total Protein, Urine: 27 mg/dL — ABNORMAL HIGH (ref 5–24)
Volume, Urine: 3500 mL

## 2014-05-16 LAB — UPEP/TP, 24-HR URINE
ALPHA-2-GLOBULIN, U: 6.3 %
Albumin: 68.4 %
Alpha-1-Globulin, U: 17.1 %
Beta Globulin, U: 3.2 %
Collection Interval: 24 hours
Gamma Globulin, U: 5 %
Total Protein, Urine/Day: 945 mg/d — ABNORMAL HIGH (ref 50–100)
Total Protein, Urine: 27 mg/dL
Total Volume, Urine: 3500 mL

## 2014-05-21 ENCOUNTER — Encounter: Payer: Self-pay | Admitting: Internal Medicine

## 2014-05-24 ENCOUNTER — Other Ambulatory Visit: Payer: Self-pay | Admitting: Dermatology

## 2014-05-24 DIAGNOSIS — B079 Viral wart, unspecified: Secondary | ICD-10-CM | POA: Diagnosis not present

## 2014-05-24 DIAGNOSIS — L821 Other seborrheic keratosis: Secondary | ICD-10-CM | POA: Diagnosis not present

## 2014-05-24 DIAGNOSIS — D485 Neoplasm of uncertain behavior of skin: Secondary | ICD-10-CM | POA: Diagnosis not present

## 2014-05-24 DIAGNOSIS — D692 Other nonthrombocytopenic purpura: Secondary | ICD-10-CM | POA: Diagnosis not present

## 2014-05-29 ENCOUNTER — Ambulatory Visit: Payer: Self-pay | Admitting: Internal Medicine

## 2014-06-13 IMAGING — CR DG CHEST 2V
2 series · 2 of 2 positions shown · non-contrast
Comparison: 04/16/2012 and prior chest radiographs

CLINICAL DATA: Shortness of breath, cough and congestion.

CHEST - 2 VIEW

[w chest pa]
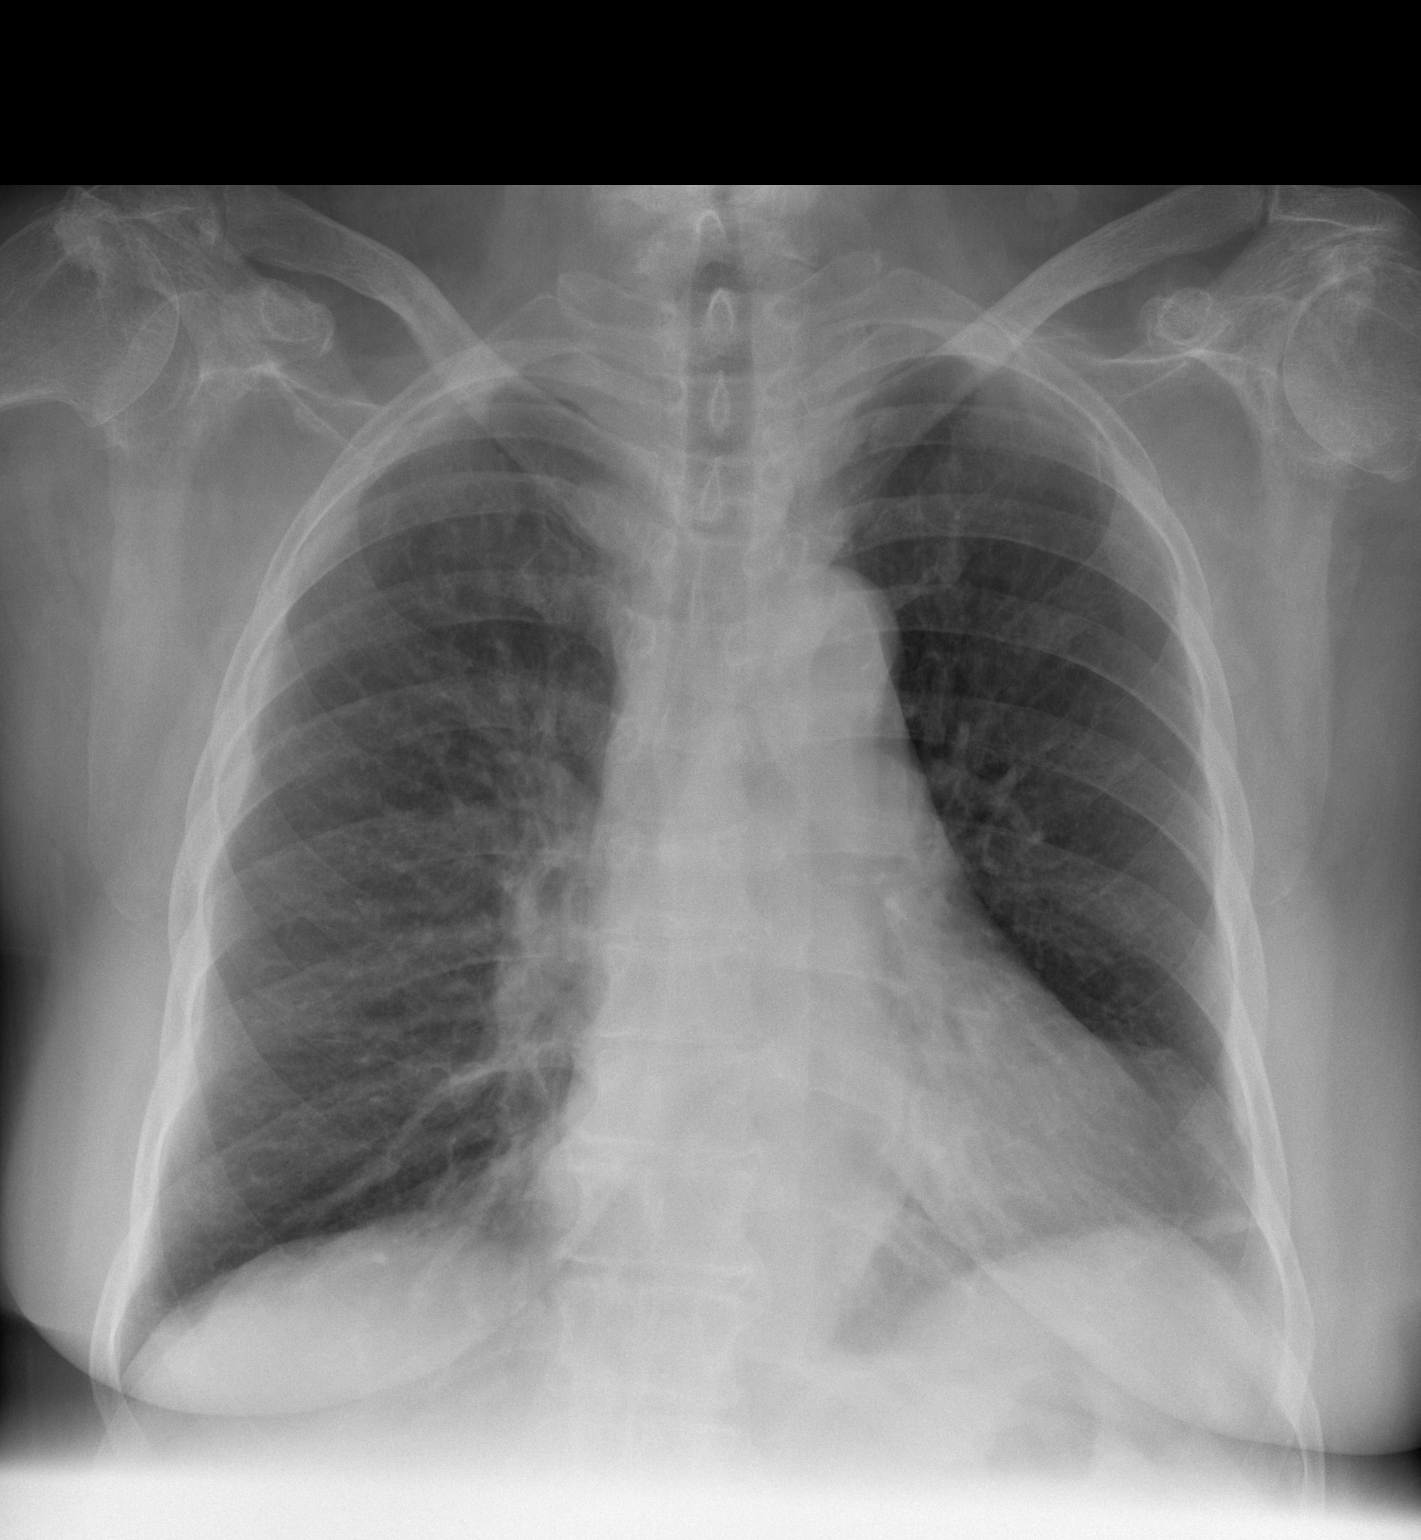

[w chest lat]
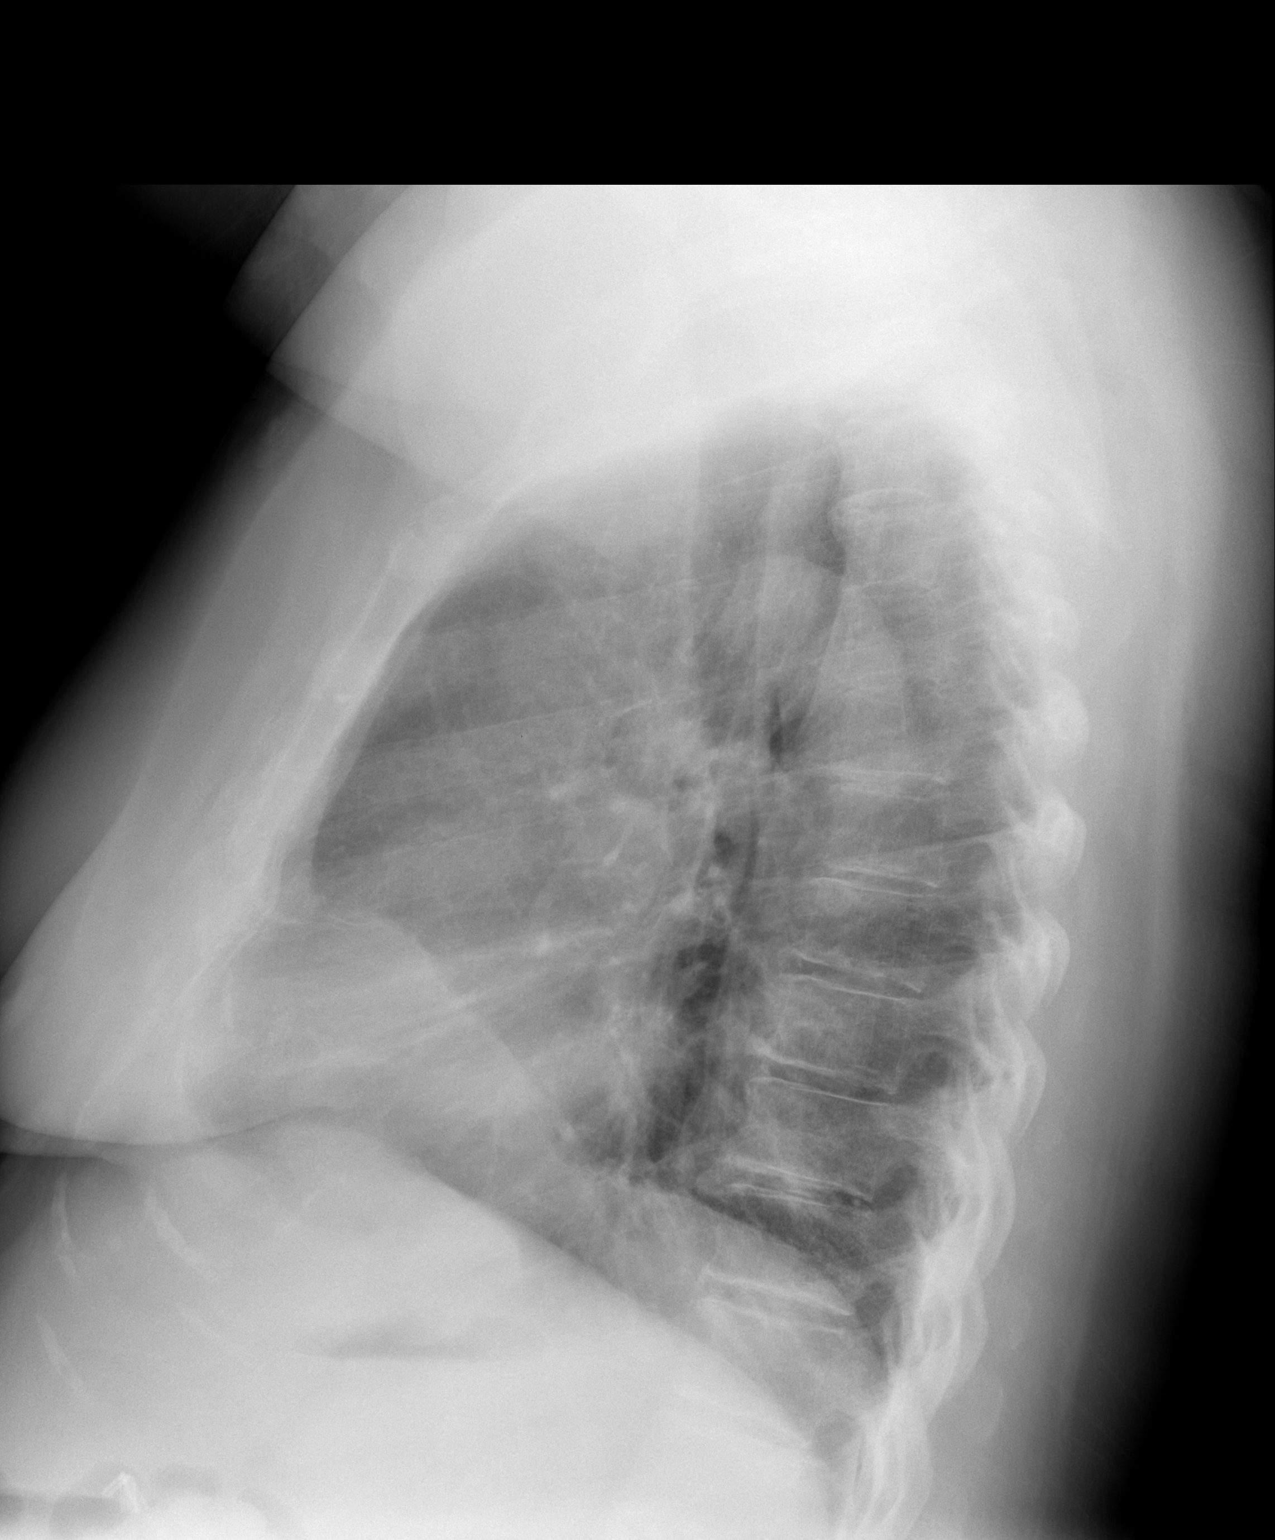

[2 of 2 positions shown; findings below may reference images not displayed]

FINDINGS: The cardiomediastinal silhouette is unremarkable.
COPD/emphysema is noted.
Left basilar scarring is again identified.
There is no evidence of focal airspace disease, pulmonary edema,
suspicious pulmonary nodule/mass, pleural effusion, or
pneumothorax.
No acute bony abnormalities are identified.
Degenerative changes within the shoulders again identified.
IMPRESSION: COPD/emphysema without evidence of acute cardiopulmonary disease.

## 2014-06-18 DIAGNOSIS — Z79899 Other long term (current) drug therapy: Secondary | ICD-10-CM | POA: Diagnosis not present

## 2014-06-20 ENCOUNTER — Encounter: Payer: Self-pay | Admitting: Internal Medicine

## 2014-06-20 ENCOUNTER — Ambulatory Visit (INDEPENDENT_AMBULATORY_CARE_PROVIDER_SITE_OTHER): Payer: Medicare Other | Admitting: Internal Medicine

## 2014-06-20 VITALS — BP 130/72 | HR 78 | Ht 63.0 in | Wt 166.5 lb

## 2014-06-20 DIAGNOSIS — I10 Essential (primary) hypertension: Secondary | ICD-10-CM | POA: Diagnosis not present

## 2014-06-20 DIAGNOSIS — E7800 Pure hypercholesterolemia, unspecified: Secondary | ICD-10-CM

## 2014-06-20 DIAGNOSIS — I2510 Atherosclerotic heart disease of native coronary artery without angina pectoris: Secondary | ICD-10-CM

## 2014-06-20 NOTE — Progress Notes (Signed)
06/20/2014   PCP: [name], MD   Chief Complaint  Patient presents with  . 6 month visit    reports palpitations and pain yesterday (abdomen & into neck on right side); daughter reports SOB with walking; c/o lightheadedness/headaches; c/o feeling like legs are tight/edema    Primary Cardiologist:Previously Dr. Rollene Fare now Dr. Debara Pickett  HPI:  23 YOWF presents today with her daughter after developing chest pain, Rt sided yesterday.  Pt had walked- her usual exercise in the morning, later while watching TV she developed Rt side chest pain, the pain then moved to mid sternal area then the rt arm.  Her throat felt strange also.  She had mild nausea.  Her daughter called our office and we instructed her to take NTG sl.  The patient was too afraid.  She took a lozenger that helped her throat.  Pain resolved within 15 min.    She has hx. Of CAD with cath in 2006 and rec'd Taxus DES of the mid RCA.  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  Last lexiscan myoview 09/2012 was low risk scan.  Echo 07/2012 EF 55-60%, mild hypokinesis of inf wall,  Grade 1 diastolic dysfunction.  Other hx as below.  With her negative nuc her Imdur had been d/c'd.  She developed chest pain and it was restarted at 30 mg.  Previously she had been on 60 mg.  At her last office visit, [name] started her back on Imdur 60 mg daily.  She now reports her chest pain has resolved.  Her main complaint at this time include ankle pain and some low back pain. She denies any recurrent chest pain. She had recent laboratory work performed at her primary care doctor's office this past week which shows moderate chronic kidney disease with a GFR of 31. No liver enzyme abnormalities. Well controlled cholesterol at total cholesterol 141, triglycerides 114, [name] 51 LDL 67. Hemoglobin A1c 5.5, TSH 1.7. ApoB is 58.  Mrs. Dunkley follows up today from recent hospitalization where she possibly had a mild TIA. It is not clear however her  aspirin was switched over to Plavix. She recently had an episode this past weekend of right-sided chest discomfort that went up to a neck. She was then complaining of some throat pain. This caused great concern her daughter who state up with her all night but did not note any further symptoms. She did take aspirin with some relief. She's afraid to take nitroglycerin but is on long-acting isosorbide. EKG is normal the office today.    No Known Allergies  Current Outpatient Prescriptions  Medication Sig Dispense Refill  . acetaminophen (TYLENOL) 500 MG tablet Take 1,000 mg by mouth every 6 (six) hours as needed (pain). 2 tablets every morning and then 6 hours as needed    . albuterol (PROVENTIL HFA;VENTOLIN HFA) 108 (90 BASE) MCG/ACT inhaler Inhale 2 puffs into the lungs every 6 (six) hours as needed for wheezing or shortness of breath.    Marland Kitchen alendronate (FOSAMAX) 70 MG tablet Take 1 tablet (70 mg total) by mouth every 7 (seven) days. Take with a full glass of water on an empty stomach. 4 tablet 5  . cetirizine (ZYRTEC) 10 MG tablet Take 10 mg by mouth at bedtime.     . clopidogrel (PLAVIX) 75 MG tablet Take 1 tablet (75 mg total) by mouth daily with breakfast. 30 tablet 0  . clozapine (CLOZARIL) 200 MG tablet Take 300 mg by mouth at  bedtime. Take 1.5 tablets (300 mg) at bedtime    . Cranberry (SM CRANBERRY) 300 MG tablet Take 300 mg by mouth at bedtime.     Marland Kitchen diltiazem (DILACOR XR) 180 MG 24 hr capsule Take 180 mg by mouth daily.    Marland Kitchen docusate sodium (COLACE) 100 MG capsule Take 200 mg by mouth at bedtime.  10 capsule 0  . ferrous sulfate 325 (65 FE) MG EC tablet Take 325 mg by mouth 2 (two) times daily.      . Fluticasone-Salmeterol (ADVAIR) 250-50 MCG/DOSE AEPB Inhale 1 puff into the lungs at bedtime.    . isosorbide mononitrate (IMDUR) 60 MG 24 hr tablet Take 1 tablet (60 mg total) by mouth daily. 30 tablet 6  . levothyroxine (SYNTHROID, LEVOTHROID) 75 MCG tablet Take 75 mcg by mouth daily before  breakfast.    . metoprolol succinate (TOPROL-XL) 100 MG 24 hr tablet Take 100 mg by mouth daily. Take with or immediately following a meal.    . Multiple Vitamin (MULTIVITAMIN WITH MINERALS) TABS Take 1 tablet by mouth at bedtime. Psychiatrist    . nitroGLYCERIN (NITROSTAT) 0.4 MG SL tablet Place 1 tablet (0.4 mg total) under the tongue every 5 (five) minutes as needed. For chest pain 25 tablet 3  . Omega-3 Fatty Acids (FISH OIL PO) Take by mouth daily. 1400mg /980mg     . pantoprazole (PROTONIX) 40 MG tablet Take 1 tablet (40 mg total) by mouth daily. 30 tablet 11  . Polyethyl Glycol-Propyl Glycol (SYSTANE OP) Apply 1 drop to eye 2 (two) times daily as needed (dry eyes).    . polyethylene glycol (MIRALAX / GLYCOLAX) packet Take 17 g by mouth daily as needed (constipation).    . pravastatin (PRAVACHOL) 20 MG tablet Take 20 mg by mouth at bedtime.    . predniSONE (DELTASONE) 1 MG tablet Take 6 mg by mouth daily with breakfast.     . Pseudoephedrine-Guaifenesin (GUAIFEN-PSE PO) Take 600 mg by mouth daily as needed (congestion).     . temazepam (RESTORIL) 15 MG capsule Take 15 mg by mouth at bedtime as needed.     . tiotropium (SPIRIVA) 18 MCG inhalation capsule Place 18 mcg into inhaler and inhale daily.    . traMADol (ULTRAM) 50 MG tablet Take 50-100 mg by mouth 2 (two) times daily as needed for severe pain.    . Vitamin D, Ergocalciferol, (DRISDOL) 50000 UNITS CAPS capsule Take 1 capsule (50,000 Units total) by mouth every 7 (seven) days. 4 capsule 0   No current facility-administered medications for this visit.    Past Medical History  Diagnosis Date  . Diabetes mellitus   . Frozen shoulder     right  . Obesity   . Renal insufficiency   . COPD (chronic obstructive pulmonary disease)   . Shortness of breath   . Stroke   . CAD (coronary artery disease) 2006    Taxus stent of mid RCA. negative myoview 2014 and normal LV function  . Diastolic heart failure, NYHA class 1   .  Polymyalgia rheumatica   . MGUS (monoclonal gammopathy of unknown significance) 01/28/2014    Past Surgical History  Procedure Laterality Date  . Partial hysterectomy  1995  . Abdominal hysterectomy    . Cholecystectomy    . Coronary angioplasty with stent placement  2006    Taxus DES to RCA, 50 % residual    TDD:UKGURKY:HC colds or fevers, no weight changes Skin:no rashes or ulcers HEENT:no blurred vision, no  congestion CV:see HPI PUL:see HPI GI:no diarrhea constipation or melena, no indigestion GU:no hematuria, no dysuria MS:+ joint pain, no claudication Neuro:no syncope, no lightheadedness Endo:+ diabetes, + thyroid disease  PHYSICAL EXAM BP 130/72 mmHg  Pulse 78  Ht 5\' 3"  (1.6 m)  Wt 166 lb 8 oz (75.524 kg)  BMI 29.50 kg/m2 GEN: Awake, NAD HEENT: PERRLA, EOMI LUNGS: CLEAR ABD: S/NT, +BS EXT: No edema NEURO: Grossly intact PSYCH: Normal mood, affect  EKG: NSR at 78  ASSESSMENT AND PLAN Patient Active Problem List   Diagnosis Date Noted  . Chronic kidney disease 01/28/2014  . 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 Leukocytosis 01/28/2014  . Memory loss 11/26/2013  . TIA (transient ischemic attack) 10/16/2013  . COPD exacerbation 07/16/2013  . Snoring 06/29/2013  . Diabetes mellitus, type 2 06/29/2013  . Chest pain at rest 03/14/2013  . Estrogen deficiency 02/06/2013  . Bruising 01/02/2013  . Osteoporosis screening 01/02/2013  . Breast cancer screening 01/02/2013  . Personal history of fall 10/19/2012  . Polymyalgia rheumatica 10/03/2012  . Weakness 09/27/2012  . Trochanteric bursitis of right hip 04/11/2012  . At high risk for falls 02/28/2012  . Lower urinary tract symptoms 01/27/2012  . Chronic constipation 01/26/2012  . Memory impairment 12/01/2010  . History of small bowel obstruction 11/04/2010  . COPD, MILD 07/09/2010  . 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 CONSTIPATION, CHRONIC 12/12/2008  . PRURITUS  03/06/2008  . SCHIZOAFFECTIVE DISORDER 01/10/2008  . GOUT 09/11/2007  . LOW BACK PAIN, CHRONIC 07/05/2007  . HYPERCHOLESTEROLEMIA 01/25/2007  . HYPOTHYROIDISM, UNSPECIFIED 08/11/2006  . OBESITY, NOS 08/11/2006  . Anemia 08/11/2006  . HYPERTENSION, BENIGN SYSTEMIC 08/11/2006  . Coronary atherosclerosis 08/11/2006  . HEMORRHOIDS, NOS 08/11/2006  . GASTROESOPHAGEAL REFLUX, NO ESOPHAGITIS 08/11/2006  . RENAL INSUFFICIENCY, CHRONIC 08/11/2006  . ARTHRITIS 08/11/2006   PLAN: 1.  Ms. Altland does have a number of medical problems. She recently had right-sided chest pain which does not sound like angina. There was eventually some relief after aspirin administration. She is complaining of some throat pain. She may be getting a small viral infection. She has some chest congestion and nasal pressure. She could benefit from a nasal saline spray and decongestant such as Mucinex. She is currently on Plavix which is causing her more bruising due to ongoing steroid use for her polymyalgia rheumatica. There are no active cardiac complaints. Plan to see her back in 6 months.  Pixie Casino, MD, [facility] Attending Cardiologist [place]

## 2014-06-20 NOTE — Patient Instructions (Signed)
Your physician wants you to follow-up in: 6 months with Dr. Hilty. You will receive a reminder letter in the mail two months in advance. If you don't receive a letter, please call our office to schedule the follow-up appointment.    

## 2014-06-25 DIAGNOSIS — N390 Urinary tract infection, site not specified: Secondary | ICD-10-CM | POA: Diagnosis not present

## 2014-06-25 DIAGNOSIS — R8299 Other abnormal findings in urine: Secondary | ICD-10-CM | POA: Diagnosis not present

## 2014-07-08 DIAGNOSIS — R0789 Other chest pain: Secondary | ICD-10-CM | POA: Diagnosis not present

## 2014-07-08 DIAGNOSIS — Z79899 Other long term (current) drug therapy: Secondary | ICD-10-CM | POA: Diagnosis not present

## 2014-07-08 DIAGNOSIS — Z6828 Body mass index (BMI) 28.0-28.9, adult: Secondary | ICD-10-CM | POA: Diagnosis not present

## 2014-07-08 DIAGNOSIS — D472 Monoclonal gammopathy: Secondary | ICD-10-CM | POA: Diagnosis not present

## 2014-07-08 DIAGNOSIS — M353 Polymyalgia rheumatica: Secondary | ICD-10-CM | POA: Diagnosis not present

## 2014-07-08 DIAGNOSIS — I6529 Occlusion and stenosis of unspecified carotid artery: Secondary | ICD-10-CM | POA: Diagnosis not present

## 2014-07-08 DIAGNOSIS — I10 Essential (primary) hypertension: Secondary | ICD-10-CM | POA: Diagnosis not present

## 2014-07-08 DIAGNOSIS — G459 Transient cerebral ischemic attack, unspecified: Secondary | ICD-10-CM | POA: Diagnosis not present

## 2014-07-08 DIAGNOSIS — R61 Generalized hyperhidrosis: Secondary | ICD-10-CM | POA: Diagnosis not present

## 2014-07-08 DIAGNOSIS — E119 Type 2 diabetes mellitus without complications: Secondary | ICD-10-CM | POA: Diagnosis not present

## 2014-07-09 ENCOUNTER — Ambulatory Visit: Payer: Self-pay | Admitting: Internal Medicine

## 2014-07-15 DIAGNOSIS — M353 Polymyalgia rheumatica: Secondary | ICD-10-CM | POA: Diagnosis not present

## 2014-07-15 DIAGNOSIS — M150 Primary generalized (osteo)arthritis: Secondary | ICD-10-CM | POA: Diagnosis not present

## 2014-07-15 DIAGNOSIS — D472 Monoclonal gammopathy: Secondary | ICD-10-CM | POA: Diagnosis not present

## 2014-07-15 DIAGNOSIS — M810 Age-related osteoporosis without current pathological fracture: Secondary | ICD-10-CM | POA: Diagnosis not present

## 2014-07-15 DIAGNOSIS — Z79899 Other long term (current) drug therapy: Secondary | ICD-10-CM | POA: Diagnosis not present

## 2014-07-17 ENCOUNTER — Ambulatory Visit (INDEPENDENT_AMBULATORY_CARE_PROVIDER_SITE_OTHER): Payer: Medicare Other

## 2014-07-17 DIAGNOSIS — I6502 Occlusion and stenosis of left vertebral artery: Secondary | ICD-10-CM

## 2014-07-17 DIAGNOSIS — G459 Transient cerebral ischemic attack, unspecified: Secondary | ICD-10-CM | POA: Diagnosis not present

## 2014-08-16 DIAGNOSIS — Z79899 Other long term (current) drug therapy: Secondary | ICD-10-CM | POA: Diagnosis not present

## 2014-08-27 IMAGING — CR DG LUMBAR SPINE COMPLETE 4+V
5 series · 5 of 5 positions shown · non-contrast
Comparison: CT of the abdomen and pelvis 04/16/2012.

CLINICAL DATA: Low back pain.  Fall 1 week ago.

LUMBAR SPINE - COMPLETE 4+ VIEW

[t l-spine a.p.]
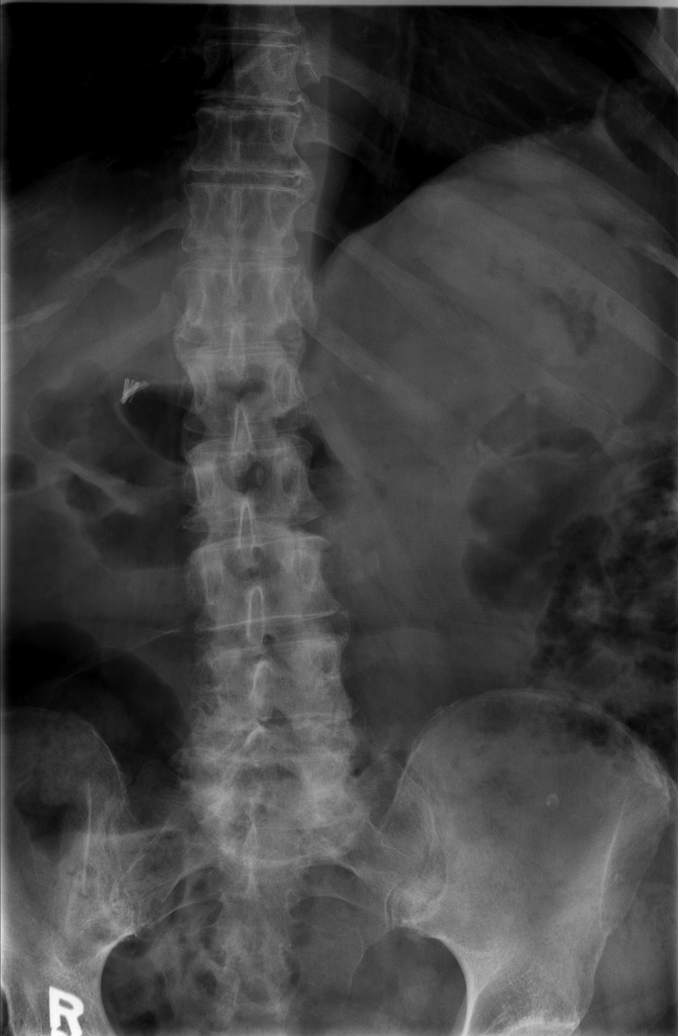

[t l-spine oblique exposure (1 of 2)]
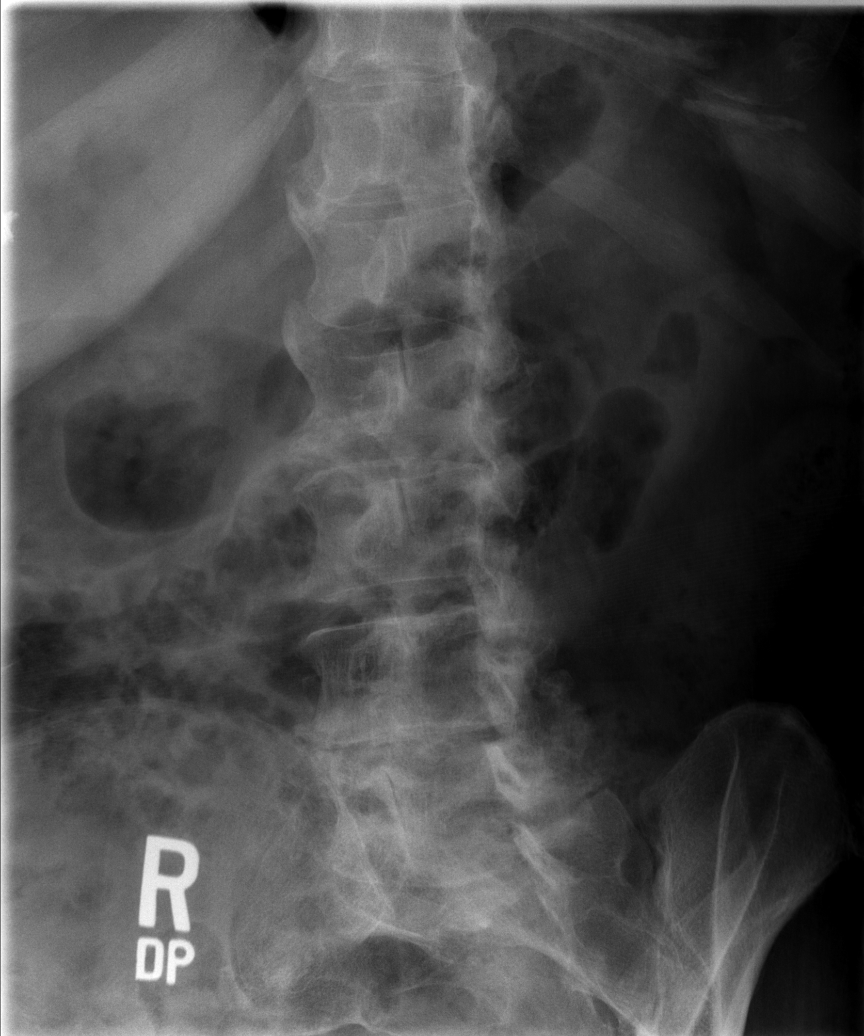

[t l-spine oblique exposure (2 of 2)]
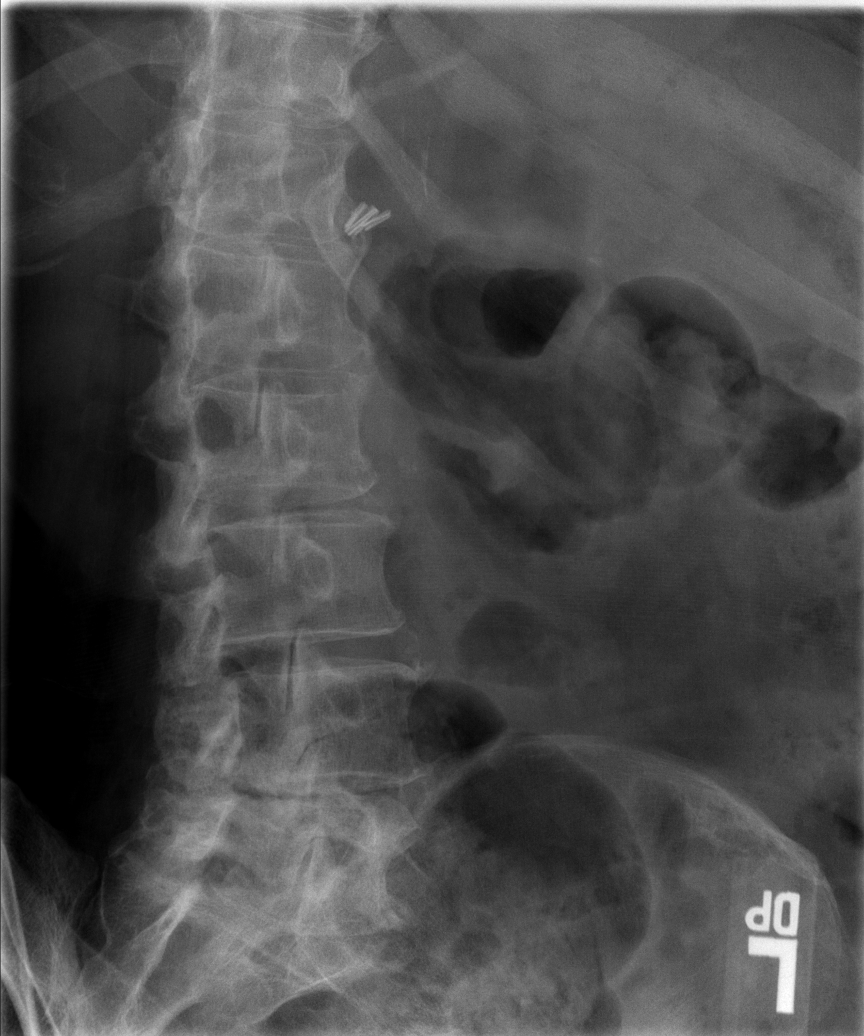

[t l-spine lat]
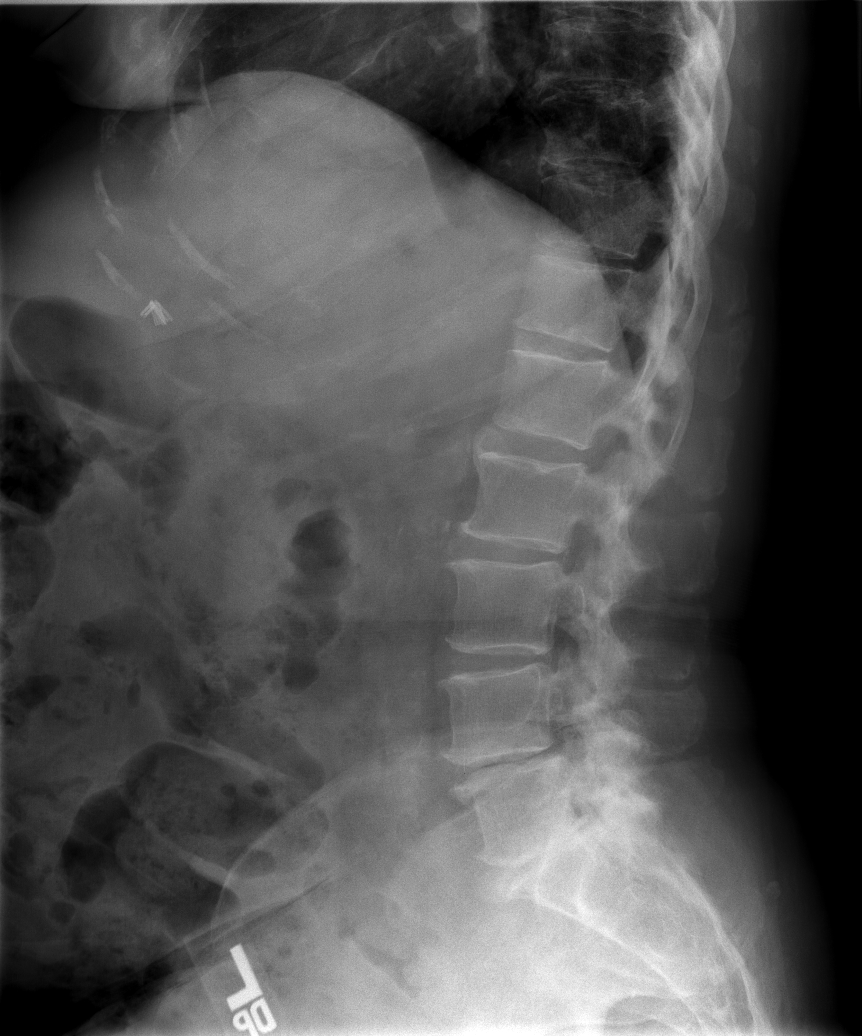

[t l-spine l5-s1 spot]
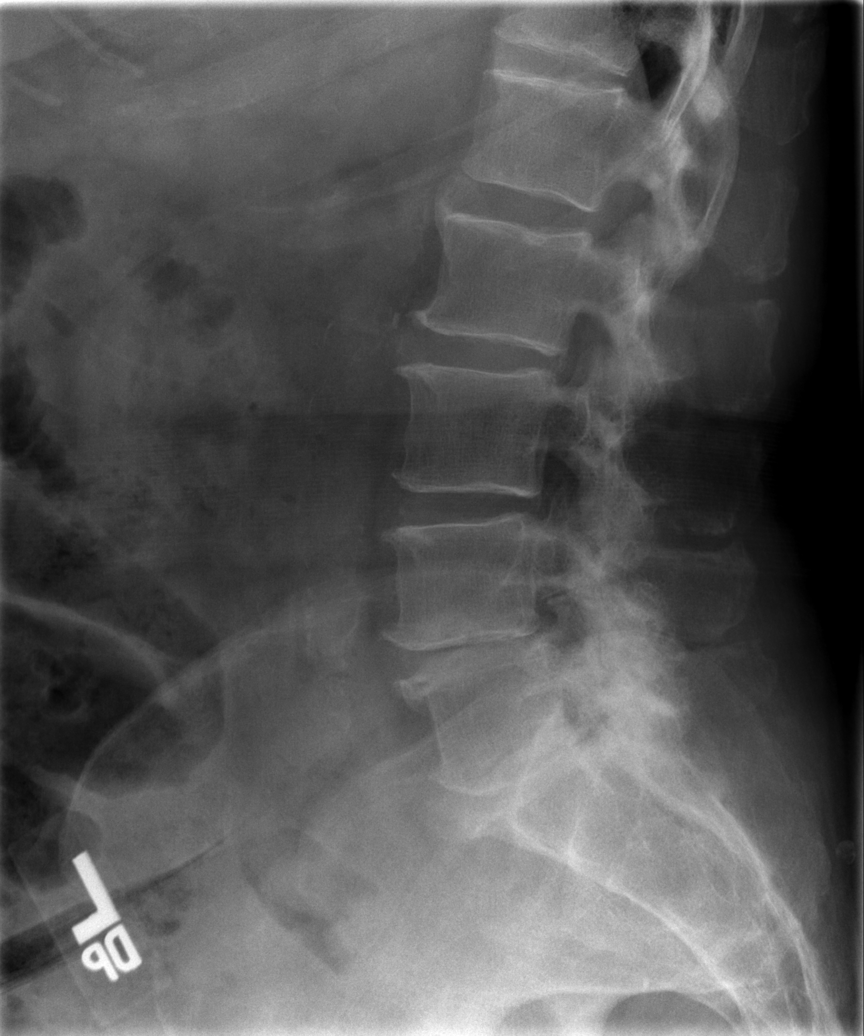

[5 of 5 positions shown; findings below may reference images not displayed]

FINDINGS: Degenerative disc disease at L4-5 and L5-S1 with disc
space narrowing, spurring and vacuum disc.  Degenerative facet
disease from L2-3 through L5-S1, most pronounced at L4-5 and L5-S1.
Slight anterolisthesis of L4 on L5.  No fracture.  SI joints are
symmetric and unremarkable.  Prior cholecystectomy.
IMPRESSION: Degenerative disc and facet disease.  No acute findings.

## 2014-08-27 IMAGING — CR DG SACRUM/COCCYX 2+V
3 series · 3 of 3 positions shown · non-contrast
Comparison: 04/16/2012 CT.

CLINICAL DATA: Worsening low back pain, sacral pain.

SACRUM AND COCCYX - 2+ VIEW

[t sacrum a.p.]
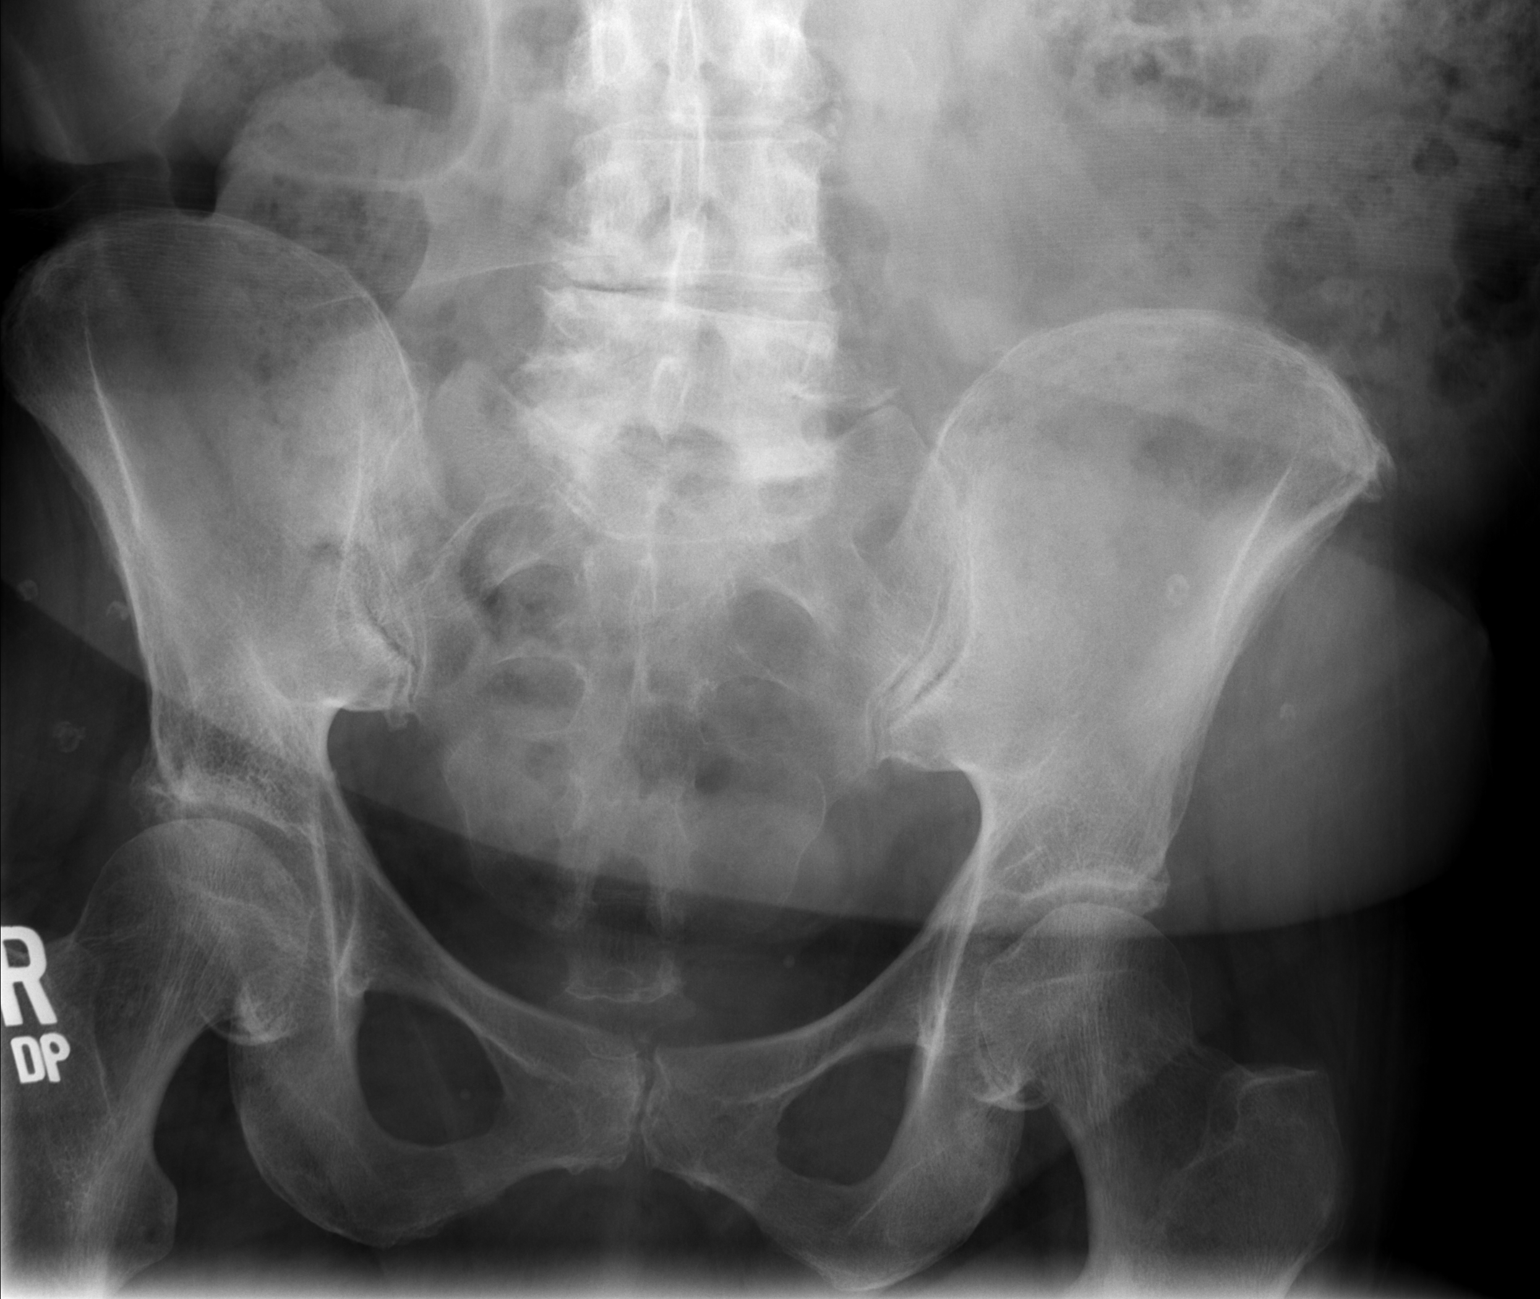

[t sacrum lat (1 of 2)]
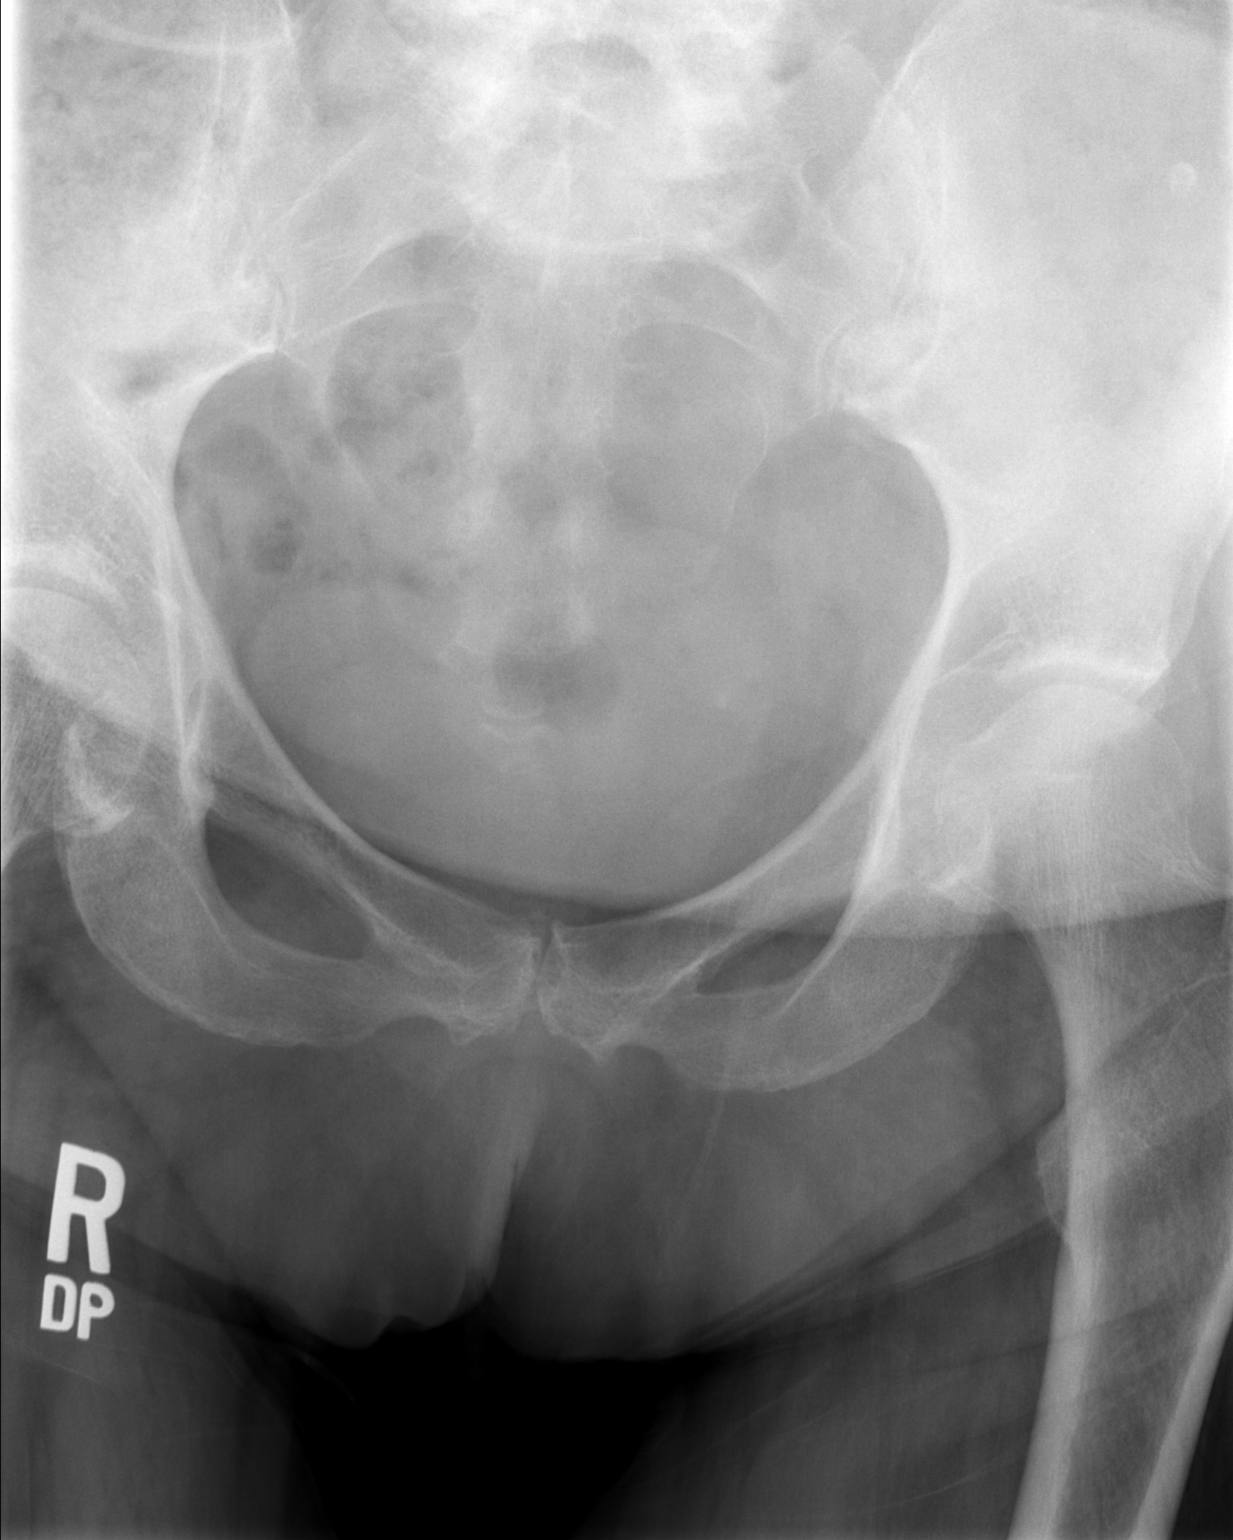

[t sacrum lat (2 of 2)]
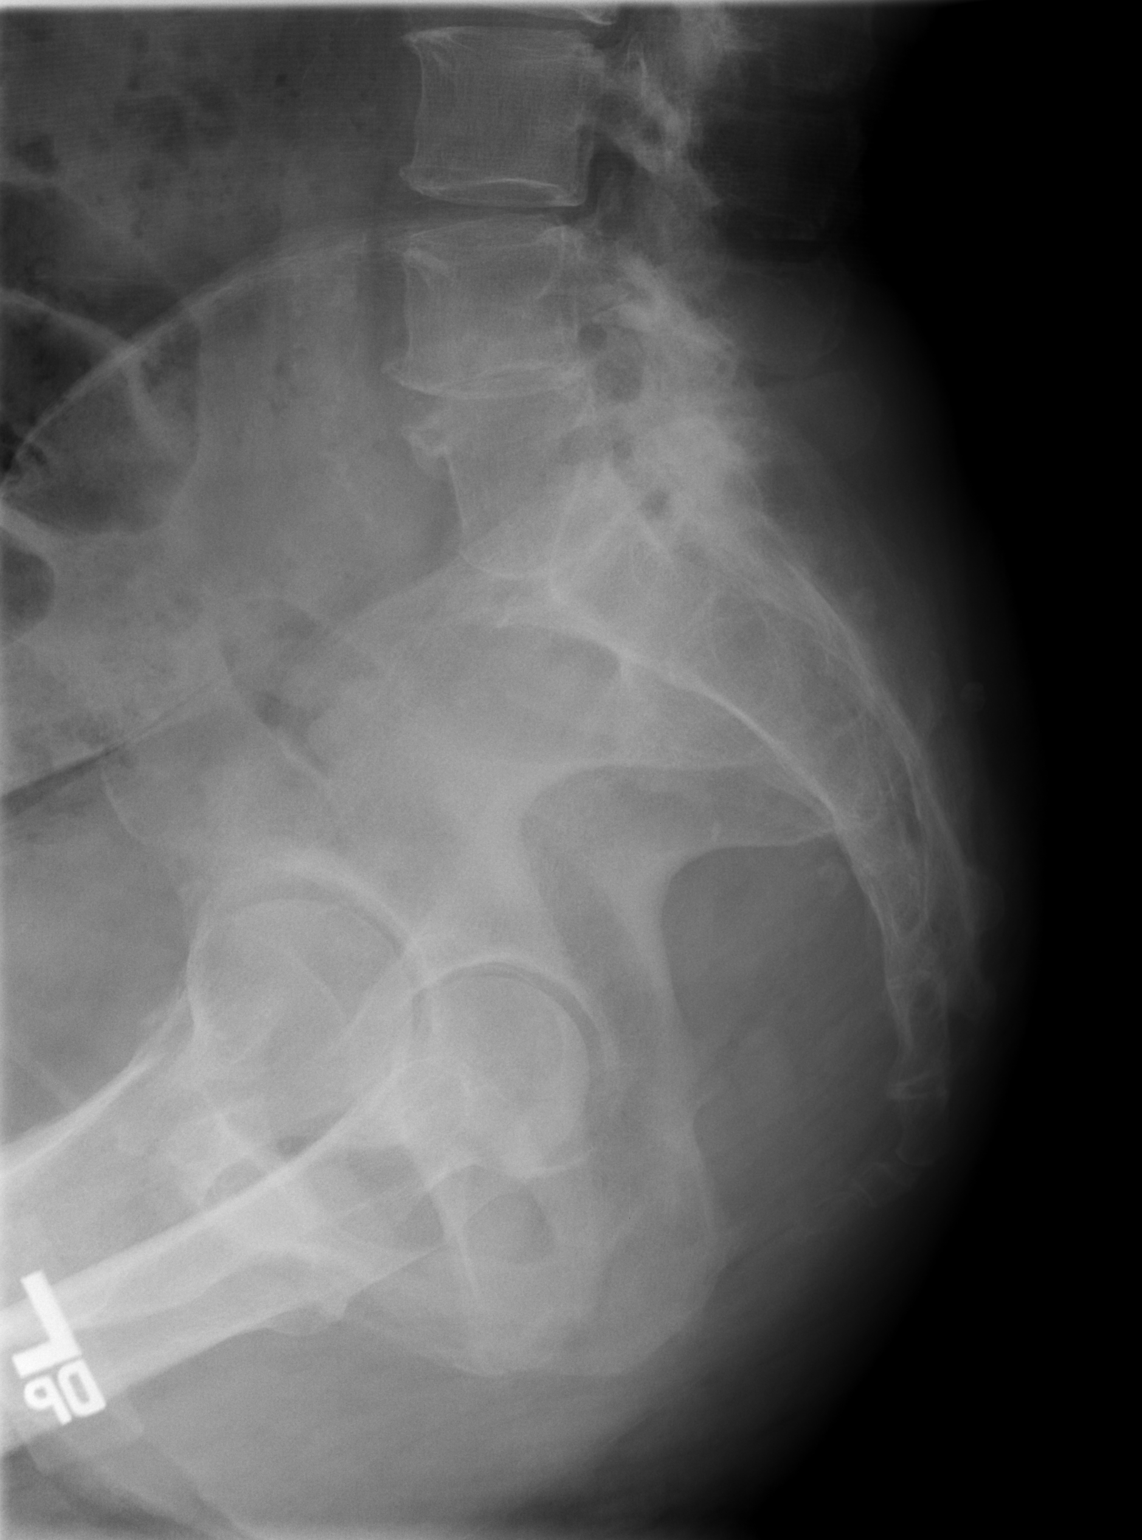

[3 of 3 positions shown; findings below may reference images not displayed]

FINDINGS: No acute bony abnormality.  No fracture, subluxation or
dislocation.  Degenerative changes in the lower lumbar spine and
hips.
IMPRESSION: No acute bony abnormality.

## 2014-08-29 DIAGNOSIS — J209 Acute bronchitis, unspecified: Secondary | ICD-10-CM | POA: Diagnosis not present

## 2014-08-29 DIAGNOSIS — R05 Cough: Secondary | ICD-10-CM | POA: Diagnosis not present

## 2014-08-29 DIAGNOSIS — J309 Allergic rhinitis, unspecified: Secondary | ICD-10-CM | POA: Diagnosis not present

## 2014-08-29 DIAGNOSIS — Z6828 Body mass index (BMI) 28.0-28.9, adult: Secondary | ICD-10-CM | POA: Diagnosis not present

## 2014-08-29 DIAGNOSIS — R269 Unspecified abnormalities of gait and mobility: Secondary | ICD-10-CM | POA: Diagnosis not present

## 2014-08-29 DIAGNOSIS — M545 Low back pain: Secondary | ICD-10-CM | POA: Diagnosis not present

## 2014-09-17 DIAGNOSIS — Z79899 Other long term (current) drug therapy: Secondary | ICD-10-CM | POA: Diagnosis not present

## 2014-09-27 DIAGNOSIS — E119 Type 2 diabetes mellitus without complications: Secondary | ICD-10-CM | POA: Diagnosis not present

## 2014-09-27 DIAGNOSIS — I2510 Atherosclerotic heart disease of native coronary artery without angina pectoris: Secondary | ICD-10-CM | POA: Diagnosis not present

## 2014-09-27 DIAGNOSIS — R8290 Unspecified abnormal findings in urine: Secondary | ICD-10-CM | POA: Diagnosis not present

## 2014-09-27 DIAGNOSIS — N390 Urinary tract infection, site not specified: Secondary | ICD-10-CM | POA: Diagnosis not present

## 2014-09-27 DIAGNOSIS — M859 Disorder of bone density and structure, unspecified: Secondary | ICD-10-CM | POA: Diagnosis not present

## 2014-09-27 DIAGNOSIS — E039 Hypothyroidism, unspecified: Secondary | ICD-10-CM | POA: Diagnosis not present

## 2014-09-27 DIAGNOSIS — E785 Hyperlipidemia, unspecified: Secondary | ICD-10-CM | POA: Diagnosis not present

## 2014-09-27 DIAGNOSIS — I10 Essential (primary) hypertension: Secondary | ICD-10-CM | POA: Diagnosis not present

## 2014-10-07 DIAGNOSIS — F250 Schizoaffective disorder, bipolar type: Secondary | ICD-10-CM | POA: Diagnosis not present

## 2014-10-08 DIAGNOSIS — Z0000 Encounter for general adult medical examination without abnormal findings: Secondary | ICD-10-CM | POA: Diagnosis not present

## 2014-10-08 DIAGNOSIS — Z6829 Body mass index (BMI) 29.0-29.9, adult: Secondary | ICD-10-CM | POA: Diagnosis not present

## 2014-10-08 DIAGNOSIS — Z23 Encounter for immunization: Secondary | ICD-10-CM | POA: Diagnosis not present

## 2014-10-08 DIAGNOSIS — I129 Hypertensive chronic kidney disease with stage 1 through stage 4 chronic kidney disease, or unspecified chronic kidney disease: Secondary | ICD-10-CM | POA: Diagnosis not present

## 2014-10-08 DIAGNOSIS — D692 Other nonthrombocytopenic purpura: Secondary | ICD-10-CM | POA: Diagnosis not present

## 2014-10-08 DIAGNOSIS — E119 Type 2 diabetes mellitus without complications: Secondary | ICD-10-CM | POA: Diagnosis not present

## 2014-10-08 DIAGNOSIS — E785 Hyperlipidemia, unspecified: Secondary | ICD-10-CM | POA: Diagnosis not present

## 2014-10-08 DIAGNOSIS — M353 Polymyalgia rheumatica: Secondary | ICD-10-CM | POA: Diagnosis not present

## 2014-10-08 DIAGNOSIS — M859 Disorder of bone density and structure, unspecified: Secondary | ICD-10-CM | POA: Diagnosis not present

## 2014-10-08 DIAGNOSIS — I25810 Atherosclerosis of coronary artery bypass graft(s) without angina pectoris: Secondary | ICD-10-CM | POA: Diagnosis not present

## 2014-10-08 DIAGNOSIS — D472 Monoclonal gammopathy: Secondary | ICD-10-CM | POA: Diagnosis not present

## 2014-10-08 DIAGNOSIS — Z1389 Encounter for screening for other disorder: Secondary | ICD-10-CM | POA: Diagnosis not present

## 2014-10-11 DIAGNOSIS — Z1212 Encounter for screening for malignant neoplasm of rectum: Secondary | ICD-10-CM | POA: Diagnosis not present

## 2014-10-14 ENCOUNTER — Telehealth: Payer: Self-pay | Admitting: *Deleted

## 2014-10-14 NOTE — Telephone Encounter (Signed)
Spoke to the pts daughter on the phone after speaking with Dr. Leta Baptist, informed her that the carotid doppler results showed no stenosis or narrowing. She stated an understanding. She also asked about a follow up appt, I was able to make her an appt for her mother on 10/28/14. She stated a thanks

## 2014-10-17 DIAGNOSIS — Z79899 Other long term (current) drug therapy: Secondary | ICD-10-CM | POA: Diagnosis not present

## 2014-10-22 ENCOUNTER — Encounter: Payer: Self-pay | Admitting: Podiatry

## 2014-10-22 ENCOUNTER — Ambulatory Visit (INDEPENDENT_AMBULATORY_CARE_PROVIDER_SITE_OTHER): Payer: Medicare Other

## 2014-10-22 ENCOUNTER — Ambulatory Visit (INDEPENDENT_AMBULATORY_CARE_PROVIDER_SITE_OTHER): Payer: Medicare Other | Admitting: Podiatry

## 2014-10-22 VITALS — BP 126/60 | HR 60 | Resp 12

## 2014-10-22 DIAGNOSIS — E1342 Other specified diabetes mellitus with diabetic polyneuropathy: Secondary | ICD-10-CM | POA: Diagnosis not present

## 2014-10-22 DIAGNOSIS — G629 Polyneuropathy, unspecified: Secondary | ICD-10-CM

## 2014-10-22 DIAGNOSIS — Q6650 Congenital pes planus, unspecified foot: Secondary | ICD-10-CM

## 2014-10-22 DIAGNOSIS — I6502 Occlusion and stenosis of left vertebral artery: Secondary | ICD-10-CM | POA: Diagnosis not present

## 2014-10-22 DIAGNOSIS — M1990 Unspecified osteoarthritis, unspecified site: Secondary | ICD-10-CM | POA: Diagnosis not present

## 2014-10-22 DIAGNOSIS — E1142 Type 2 diabetes mellitus with diabetic polyneuropathy: Secondary | ICD-10-CM

## 2014-10-22 DIAGNOSIS — M2040 Other hammer toe(s) (acquired), unspecified foot: Secondary | ICD-10-CM

## 2014-10-22 DIAGNOSIS — M79673 Pain in unspecified foot: Secondary | ICD-10-CM | POA: Diagnosis not present

## 2014-10-22 NOTE — Progress Notes (Signed)
   Subjective:    Patient ID: Rebecca [name], female    DOB: 1941/06/07, 73 y.o.   MRN: [id number]  HPI PT STATED TOP AND ARCH ON BOTH FEET BEEN HURTING FOR OVER 1 YEAR. FEET ARE GETTING WORSE, ESPECIALLY THEY THROBBING  AT NIGHT. TRIED TAKING TRAMADOL AND IT HELP SOME.   Review of Systems  HENT: Positive for sinus pressure.   Eyes: Positive for pain.  Respiratory: Positive for shortness of breath and wheezing.   Musculoskeletal: Positive for myalgias, back pain, joint swelling and gait problem.  Neurological: Positive for weakness and headaches.       Objective:   Physical Exam: I have reviewed her past medical history medications allergies surgeries social history and review of systems. Pulses are strongly palpable bilateral. Neurologic sensorium is decreased per Semmes-Weinstein monofilament. Deep tendon reflexes are sluggish bilaterally. Muscle strength is weak +4 out of 5 dorsiflexion plantar flexors and inverters and everters. Orthopedic evaluation demonstrates all joints distal to the angle full range of motion with exception of the midfoot bilateral assisted with osteoarthritic changes left greater than right. She also has hammertoe deformities 2 through 5 bilateral with some osteoarthritic changes in her radiograph. Cutaneous evaluation demonstrates slightly thickened dystrophic possibly mycotic nails bilateral. She does appear to be a fall risk upon gait analysis. She also has a shuffling gait with an obvious antalgic gait.        Assessment & Plan:  Assessment: Diabetes mellitus with diabetic peripheral neuropathy. Hammertoe deformities and pain in limb secondary to onychomycosis bilateral.  Plan: She was scanned for set of diabetic shoes today and I will follow-up with her once those have arrived. We also sent her to physical therapy for gait training and balance training.

## 2014-10-28 ENCOUNTER — Ambulatory Visit (INDEPENDENT_AMBULATORY_CARE_PROVIDER_SITE_OTHER): Payer: Medicare Other | Admitting: Diagnostic Neuroimaging

## 2014-10-28 ENCOUNTER — Encounter: Payer: Self-pay | Admitting: Diagnostic Neuroimaging

## 2014-10-28 VITALS — BP 100/61 | HR 78 | Ht 63.0 in | Wt 161.8 lb

## 2014-10-28 DIAGNOSIS — R51 Headache: Secondary | ICD-10-CM

## 2014-10-28 DIAGNOSIS — R519 Headache, unspecified: Secondary | ICD-10-CM

## 2014-10-28 DIAGNOSIS — N390 Urinary tract infection, site not specified: Secondary | ICD-10-CM | POA: Diagnosis not present

## 2014-10-28 DIAGNOSIS — I6502 Occlusion and stenosis of left vertebral artery: Secondary | ICD-10-CM

## 2014-10-28 DIAGNOSIS — R413 Other amnesia: Secondary | ICD-10-CM | POA: Diagnosis not present

## 2014-10-28 NOTE — Patient Instructions (Signed)
I will check MRI brain.  Follow up with Dr. Amil Amen re: CRP, ESR, polymyalgia.

## 2014-10-28 NOTE — Progress Notes (Signed)
[facility]  PATIENT: Rebecca [name] DOB: Oct 20, 1940  REFERRING CLINICIAN:  HISTORY FROM: patient and daughter  REASON FOR VISIT: follow up   HISTORICAL  CHIEF COMPLAINT:  Chief Complaint  Patient presents with  . Follow-up    TIA    HISTORY OF PRESENT ILLNESS:   UPDATE 10/28/14: Since last visit, doing well, until 2 weeks ago -> new onset left temporal headaches (less than 5 min), severe attacks, then resolve. Have been increasing. Some retro-orbital eye pain bilaterally. No nausea, vomiting, photo/phonophobia.   UPDATE 03/27/14: Since last visit, no new events. Memory loss is stable. Going to [facility] from 10a-2p 5 days a week, helping with respite care and mental/social stimulation.   UPDATE 01/21/14 (LL): Since last visit, Dr. Donnetta Hutching was consulted after hospital discharge and found no evidence of any carotid disease and very soft indication of any proximal vertebral disease. There was some confusion regarding left versus right regarding her brachial pressures. Dr. Donnetta Hutching did not see any indication that there was any suspicion for vertebrobasilar insufficiency in her workup. Repeat carotid dopplers in our office showed elevated RICA blood flow velocities but this finding was not supported by the presence of plaques. She has been maintained on Plavix daily without recurrent stroke or TIA symptoms. She has seen Rheumatologist in followup which had slightly increased her prednisone, she is on a slow taper for PMR. Recent Sed Rate was 13, which was much improved over 30 on 02/02/13. CRP was 6.4, previous last result in 09/27/12 was 3.2. She has been referred to Hematology for abnormal protein results found by Rheumatologist. B12 and TSH were normal. Since last visit with Dr. Leta Baptist, memory and mood concerns seemed to have stabilized. She has seen Dr. Casimiro Needle in followup.  PRIOR HPI (11/26/13): 74 year old female with heart disease, kidney disease, type 2 diabetes, schizoaffective  disorder, history of TIAs, bladder problems, here for evaluation of multiple concerns by daughter: safety of coming off Plavix for epidural steroid injection, progressive cognitive decline, increasing headaches, throat pain, left-sided numbness. I previously saw patient one time while rounding on the stroke service. Now patient presenting for evaluation of these new complaints. In summary patient was admitted from 10/16/13 until 10/18/13 due to sudden onset right-sided facial weakness and speech difficulty. Patient was admitted for TIA workup. MRI showed no acute stroke. Carotid ultrasound showed atypical waveform and left vertebral artery. On 10/18/13, I noted that there was asymmetry of blood pressure in the right versus left arm (Right arm BP 150/68; left arm BP 130/57). We considered CTA of the neck versus catheter angiogram, to look for subclavian stenosis, however this was deferred to outpatient basis as patient had renal insufficiency. Patient was discharged on Plavix with followup in stroke clinic. Since that time patient followed up with vascular surgery who did not feel that carotid ultrasound was significantly abnormal. They recommended medical management. Over past 3 weeks patient short-term memory problems have worsened. Patient has been having some intermittent memory problems for the past 2 years. Long-term memory is good. Patient also having more mood changes lately. She has not followed up with psychiatry lately. Also patient struggling with chronic pain. She was diagnosed with polymyalgia rheumatica 2 years ago and has been treated with steroids. Steroids have been tapered over last few months. She is scheduled to see rheumatology later this month. Patient also was recommended to consider epidural steroid injection. However, this would require coming off of Plavix for 5 days before the injection. Patient's  daughter was concerned about increased stroke risk during this time. Currently patient is not in  significant pain. However she takes oral pain medications which help. Patient having more frontal headaches lately.    REVIEW OF SYSTEMS: Full 14 system review of systems performed and notable only for memory loss headache insomnia snoring decreased energy joint pain aching muscles easy bruising easy bleeding.  ALLERGIES: No Known Allergies  HOME MEDICATIONS: Outpatient Prescriptions Prior to Visit  Medication Sig Dispense Refill  . acetaminophen (TYLENOL) 500 MG tablet Take 1,000 mg by mouth every 6 (six) hours as needed (pain). 2 tablets every morning and then 6 hours as needed    . albuterol (PROVENTIL HFA;VENTOLIN HFA) 108 (90 BASE) MCG/ACT inhaler Inhale 2 puffs into the lungs every 6 (six) hours as needed for wheezing or shortness of breath.    Marland Kitchen alendronate (FOSAMAX) 70 MG tablet Take 1 tablet (70 mg total) by mouth every 7 (seven) days. Take with a full glass of water on an empty stomach. 4 tablet 5  . cetirizine (ZYRTEC) 10 MG tablet Take 10 mg by mouth at bedtime.     . clopidogrel (PLAVIX) 75 MG tablet Take 1 tablet (75 mg total) by mouth daily with breakfast. 30 tablet 0  . clozapine (CLOZARIL) 200 MG tablet Take 300 mg by mouth at bedtime. Take 1.5 tablets (300 mg) at bedtime    . Cranberry (SM CRANBERRY) 300 MG tablet Take 300 mg by mouth at bedtime.     Marland Kitchen diltiazem (DILACOR XR) 180 MG 24 hr capsule Take 180 mg by mouth daily.    Marland Kitchen docusate sodium (COLACE) 100 MG capsule Take 200 mg by mouth at bedtime.  10 capsule 0  . ferrous sulfate 325 (65 FE) MG EC tablet Take 325 mg by mouth 2 (two) times daily.      . Fluticasone-Salmeterol (ADVAIR) 250-50 MCG/DOSE AEPB Inhale 1 puff into the lungs at bedtime.    . isosorbide mononitrate (IMDUR) 60 MG 24 hr tablet Take 1 tablet (60 mg total) by mouth daily. 30 tablet 6  . levothyroxine (SYNTHROID, LEVOTHROID) 75 MCG tablet Take 75 mcg by mouth daily before breakfast.    . metoprolol succinate (TOPROL-XL) 100 MG 24 hr tablet Take 100 mg  by mouth daily. Take with or immediately following a meal.    . Multiple Vitamin (MULTIVITAMIN WITH MINERALS) TABS Take 1 tablet by mouth at bedtime. Psychiatrist    . nitroGLYCERIN (NITROSTAT) 0.4 MG SL tablet Place 1 tablet (0.4 mg total) under the tongue every 5 (five) minutes as needed. For chest pain 25 tablet 3  . pantoprazole (PROTONIX) 40 MG tablet Take 1 tablet (40 mg total) by mouth daily. 30 tablet 11  . Polyethyl Glycol-Propyl Glycol (SYSTANE OP) Apply 1 drop to eye 2 (two) times daily as needed (dry eyes).    . polyethylene glycol (MIRALAX / GLYCOLAX) packet Take 17 g by mouth daily as needed (constipation).    . pravastatin (PRAVACHOL) 20 MG tablet Take 20 mg by mouth at bedtime.    . predniSONE (DELTASONE) 1 MG tablet Take 6 mg by mouth daily with breakfast.     . Pseudoephedrine-Guaifenesin (GUAIFEN-PSE PO) Take 600 mg by mouth daily as needed (congestion).     . temazepam (RESTORIL) 15 MG capsule Take 15 mg by mouth at bedtime as needed.     . tiotropium (SPIRIVA) 18 MCG inhalation capsule Place 18 mcg into inhaler and inhale daily.    . Vitamin D,  Ergocalciferol, (DRISDOL) 50000 UNITS CAPS capsule Take 1 capsule (50,000 Units total) by mouth every 7 (seven) days. 4 capsule 0  . Omega-3 Fatty Acids (FISH OIL PO) Take by mouth daily. 1445m/980mg    . traMADol (ULTRAM) 50 MG tablet Take 50-100 mg by mouth 2 (two) times daily as needed for severe pain.     No facility-administered medications prior to visit.    PAST MEDICAL HISTORY: Past Medical History  Diagnosis Date  . Diabetes mellitus   . Frozen shoulder     right  . Obesity   . Renal insufficiency   . COPD (chronic obstructive pulmonary disease)   . Shortness of breath   . Stroke   . CAD (coronary artery disease) 2006    Taxus stent of mid RCA. negative myoview 2014 and normal LV function  . Diastolic heart failure, NYHA class 1   . Polymyalgia rheumatica   . MGUS (monoclonal gammopathy of unknown  significance) 01/28/2014    PAST SURGICAL HISTORY: Past Surgical History  Procedure Laterality Date  . Partial hysterectomy  1995  . Abdominal hysterectomy    . Cholecystectomy    . Coronary angioplasty with stent placement  2006    Taxus DES to RCA, 50 % residual    FAMILY HISTORY: Family History  Problem Relation Age of Onset  . Diabetes Mother   . Heart disease Mother   . Heart disease Father   . Arthritis Father   . Healthy Sister   . Heart disease Brother   . Cancer Brother     colon ca  . Osteoporosis Sister   . Heart disease Brother   . Diabetes Brother   . Heart disease Brother   . Diabetes Brother   . Diabetes Brother   . Healthy Brother   . Healthy Brother   . Healthy Brother   . Cancer Maternal Uncle     blood condition  . Cancer Daughter     breast ca  . Cancer Cousin     [employer]    SOCIAL HISTORY:  History   Social History  . Marital Status: Divorced    Spouse Name: N/A  . Number of Children: 3  . Years of Education: 9th   Occupational History  . homemaker    Social History Main Topics  . Smoking status: Former Smoker -- 1.00 packs/day for 35 years    Quit date: 04/14/2004  . Smokeless tobacco: Never Used  . Alcohol Use: No  . Drug Use: No  . Sexual Activity: Not on file   Other Topics Concern  . Not on file   Social History Narrative   Daughter AEstill Bambergand son are very involved.     AEstill Bambergcomes to most office visits.   She now lives at home with daughter AEstill Bamberg  Daughter is HCPOA, not averse to SNF/rehab in short-term, but [facility].   Caffeine Use: 1-3 cups daily     PHYSICAL EXAM  Filed Vitals:   10/28/14 1525  BP: 100/61  Pulse: 78  Height: 5' 3"  (1.6 m)  Weight: 161 lb 12.8 oz (73.392 kg)    Not recorded      Body mass index is 28.67 kg/(m^2).  GENERAL EXAM: Patient is in no  distress; well developed, nourished and groomed; neck is supple  CARDIOVASCULAR: Regular rate and rhythm, no murmurs, no  carotid bruits; BILATERAL RADIAL PULSES SYMM; BILATERAL TEMPORAL ARTERY PULSES SYMM / NON-TENDER  NEUROLOGIC: MENTAL STATUS: PLEASANT, CALM; awake, alert, oriented to person, place and time, recent and remote memory intact, normal attention and concentration, language fluent, comprehension intact, naming intact, fund of knowledge appropriate CRANIAL NERVE: pupils equal and reactive to light, visual fields full to confrontation, extraocular muscles intact, no nystagmus, facial sensation and strength symmetric, HEARING DECREASED, palate elevates symmetrically, uvula midline, shoulder shrug symmetric, tongue midline. MOTOR: normal bulk and tone, full strength in the BUE, BLE SENSORY: normal and symmetric to light touch, pinprick, temperature; VIB < 5 SEC AT TOES COORDINATION: finger-nose-finger, fine finger movements normal REFLEXES: BUE 1, KNEES TRACE, ANKLES ABSENT GAIT/STATION: narrow based gait; SLOW AND CAUTIOUS, DECR ARM SWING   DIAGNOSTIC DATA (LABS, IMAGING, TESTING) - I reviewed patient records, labs, notes, testing and imaging myself where available.  Lab Results  Component Value Date   WBC 13.4* 04/15/2014   HGB 10.7* 04/15/2014   HCT 33.0* 04/15/2014   MCV 93.2 04/15/2014   PLT 206 04/15/2014      Component Value Date/Time   NA 135* 04/15/2014 1137   NA 131* 10/16/2013 1553   NA 137 06/21/2012   K 5.7* 04/15/2014 1137   K 4.5 10/16/2013 1553   CL 93* 10/16/2013 1553   CO2 25 04/15/2014 1137   CO2 24 10/16/2013 1553   GLUCOSE 122 04/15/2014 1137   GLUCOSE 131* 10/16/2013 1553   BUN 37.6* 04/15/2014 1137   BUN 37* 10/16/2013 1553   BUN 33* 06/21/2012   CREATININE 1.9* 04/15/2014 1137   CREATININE 1.68* 10/16/2013 2027   CREATININE 1.85* 07/16/2013 1450   CREATININE 1.6* 06/21/2012   CALCIUM 10.2 04/15/2014 1137   CALCIUM 10.0 10/16/2013 1553    CALCIUM 10.0 03/21/2008 2229   PROT 6.7 04/15/2014 1137   PROT 6.5 10/16/2013 1553   ALBUMIN 3.6 04/15/2014 1137   ALBUMIN 3.2* 10/16/2013 1553   AST 17 04/15/2014 1137   AST 15 10/16/2013 1553   ALT 29 04/15/2014 1137   ALT 19 10/16/2013 1553   ALKPHOS 37* 04/15/2014 1137   ALKPHOS 39 10/16/2013 1553   BILITOT 0.40 04/15/2014 1137   BILITOT 0.3 10/16/2013 1553   GFRNONAA 29* 10/16/2013 2027   GFRAA 33* 10/16/2013 2027   Lab Results  Component Value Date   CHOL 122 10/17/2013   [name] 65 10/17/2013   [name] 41 10/17/2013   LDLDIRECT 88 09/05/2007   [name] 80 10/17/2013   [name] 1.9 10/17/2013   Lab Results  Component Value Date   HGBA1C 5.7* 10/17/2013   Lab Results  Component Value Date   VITAMINB12 1109* 11/26/2013   Lab Results  Component Value Date   TSH 1.490 11/26/2013    10/16/13 MRI brain / MRA head: 1. No evidence of acute intracranial abnormality.  2. Mild to moderate chronic small vessel ischemic disease and cerebral atrophy.  3. No evidence of major intracranial arterial occlusion or significant stenosis.  10/17/13 Carotid u/s: Brachial pressures:                RightLeft +---------+-----+----+ Systolic 185 631  +---------+-----+----+ Diastolic66   78    +---------+-----+----+ - 1-39% internal carotid artery stenosis bilaterally. The right vertebral artery is patent with antegrade flow. The left vertebral artery is patent with antegrade flow, however the waveform is mildly atypical. This along with the 60mHg gradient between brachial arteries is suggestive of a possible developing proximal obstruction.   11/26/13 TTE:  -  Left ventricle: The cavity size was normal. There was moderate concentric hypertrophy. Systolic function was normal. The estimated ejection fraction was in the range of 60% to 65%. Wall motion was normal; there were no regional wall motion abnormalities. There was an increased relative contribution of atrial contraction to  ventricular filling. Doppler parameters are consistent with abnormal left ventricular relaxation (grade 1 diastolic dysfunction). - Aortic valve: Trivial regurgitation.  12/19/13 Carotid u/s - right ICA 50-69% stenosis by velocity, but not support by presence of plaques; bilateral VA anterograde  07/17/14 carotid u/s - no ICA stenosis; anterograde VA flow bilaterally    ASSESSMENT AND PLAN  74 y.o. year old female here with type 2 diabetes, heart disease, kidney disease, schizoaffective disorder, TIAs, here for follow up of several issues:  1. Regarding history of polymyalgia rheumatica, agree with slow taper per rheumatology.  2. Regarding patient's memory problems, this is likely related to her underlying chronic pain, chronic psychiatric issues, sleep disorder. PRIOR MMSE 28/30 on 11/26/13. No frontal release signs. MRI brain does not show any significant abnormality to relate to the memory issues. Will monitor.  3. Regarding left vertebral artery abnormality on prior carotid ultrasound, this may be artifactual. I will repeat carotid ultrasound for evaluation in Feb 2016.  4. Regarding TIAs: Continue clopidogrel 75 mg daily; BP, diabetes and lipid control per PCP  5. New onset left temporal headaches (May 2016); need to consider temporal arteritis or other secondary headache   PLAN: - MRI brain to eval new onset left temporal headaches - follow up with Dr. Amil Amen (rheumatology) to re-check ESR, CRP and adjust prednisone, in case this may be related to new onset of temporal arteritis or flare of PMR  Orders Placed This Encounter  Procedures  . MR Brain Wo Contrast   Return in about 6 weeks (around 12/09/2014).   Penni Bombard, MD 9/32/3557, 3:22 PM Certified in Neurology, Neurophysiology and Neuroimaging  [facility] [address], [place], [place] [address] [phone]

## 2014-10-31 DIAGNOSIS — N390 Urinary tract infection, site not specified: Secondary | ICD-10-CM | POA: Diagnosis not present

## 2014-10-31 DIAGNOSIS — M810 Age-related osteoporosis without current pathological fracture: Secondary | ICD-10-CM | POA: Diagnosis not present

## 2014-10-31 DIAGNOSIS — N183 Chronic kidney disease, stage 3 (moderate): Secondary | ICD-10-CM | POA: Diagnosis not present

## 2014-10-31 DIAGNOSIS — I129 Hypertensive chronic kidney disease with stage 1 through stage 4 chronic kidney disease, or unspecified chronic kidney disease: Secondary | ICD-10-CM | POA: Diagnosis not present

## 2014-11-12 ENCOUNTER — Ambulatory Visit
Admission: RE | Admit: 2014-11-12 | Discharge: 2014-11-12 | Disposition: A | Discharge status: Home / Self Care | Payer: Medicare Other | Source: Ambulatory Visit | Attending: Diagnostic Neuroimaging | Admitting: Diagnostic Neuroimaging

## 2014-11-12 DIAGNOSIS — R519 Headache, unspecified: Secondary | ICD-10-CM

## 2014-11-12 DIAGNOSIS — R51 Headache: Secondary | ICD-10-CM

## 2014-11-12 DIAGNOSIS — R413 Other amnesia: Secondary | ICD-10-CM | POA: Diagnosis not present

## 2014-11-15 DIAGNOSIS — Z79899 Other long term (current) drug therapy: Secondary | ICD-10-CM | POA: Diagnosis not present

## 2014-11-15 DIAGNOSIS — N183 Chronic kidney disease, stage 3 (moderate): Secondary | ICD-10-CM | POA: Diagnosis not present

## 2014-11-19 ENCOUNTER — Encounter: Payer: Self-pay | Admitting: Internal Medicine

## 2014-11-21 ENCOUNTER — Telehealth: Payer: Self-pay | Admitting: Diagnostic Neuroimaging

## 2014-11-21 NOTE — Telephone Encounter (Signed)
Spoke to the pts daughter on the phone, informed her that the MRI did not show anything acute and no changes since 2015. She thanked me and asked the next step. I read Dr. Annell Greening office note to her and informed her that the pt should follow up with Dr. Amil Amen. She told me that she would call him and get an appt. She also asked about another appt here; I told her there was no one scheduled, she told me she would follow up if things changed or got significantly worse. She thanked me for calling

## 2014-11-21 NOTE — Telephone Encounter (Signed)
Patients daughter called and requested to speak with the nurse regarding the results to her mothers MRI. Please call and advise [phone]).

## 2014-11-25 DIAGNOSIS — H26493 Other secondary cataract, bilateral: Secondary | ICD-10-CM | POA: Diagnosis not present

## 2014-11-25 DIAGNOSIS — E119 Type 2 diabetes mellitus without complications: Secondary | ICD-10-CM | POA: Diagnosis not present

## 2014-11-25 DIAGNOSIS — H524 Presbyopia: Secondary | ICD-10-CM | POA: Diagnosis not present

## 2014-12-09 ENCOUNTER — Other Ambulatory Visit: Payer: Self-pay

## 2014-12-13 DIAGNOSIS — Z79899 Other long term (current) drug therapy: Secondary | ICD-10-CM | POA: Diagnosis not present

## 2014-12-18 ENCOUNTER — Ambulatory Visit: Payer: Self-pay | Admitting: Internal Medicine

## 2014-12-30 ENCOUNTER — Ambulatory Visit (HOSPITAL_BASED_OUTPATIENT_CLINIC_OR_DEPARTMENT_OTHER): Payer: Medicare Other

## 2014-12-30 DIAGNOSIS — N189 Chronic kidney disease, unspecified: Secondary | ICD-10-CM

## 2014-12-30 DIAGNOSIS — D649 Anemia, unspecified: Secondary | ICD-10-CM

## 2014-12-30 DIAGNOSIS — D472 Monoclonal gammopathy: Secondary | ICD-10-CM | POA: Diagnosis not present

## 2014-12-30 LAB — CBC WITH DIFFERENTIAL/PLATELET
BASO%: 0.4 % (ref 0.0–2.0)
Basophils Absolute: 0.1 10*3/uL (ref 0.0–0.1)
EOS%: 2.3 % (ref 0.0–7.0)
Eosinophils Absolute: 0.4 10*3/uL (ref 0.0–0.5)
HEMATOCRIT: 31.2 % — AB (ref 34.8–46.6)
HGB: 10.3 g/dL — ABNORMAL LOW (ref 11.6–15.9)
LYMPH#: 2.2 10*3/uL (ref 0.9–3.3)
LYMPH%: 12.2 % — AB (ref 14.0–49.7)
MCH: 31.2 pg (ref 25.1–34.0)
MCHC: 33.2 g/dL (ref 31.5–36.0)
MCV: 94.2 fL (ref 79.5–101.0)
MONO#: 0.9 10*3/uL (ref 0.1–0.9)
MONO%: 4.8 % (ref 0.0–14.0)
NEUT%: 80.3 % — ABNORMAL HIGH (ref 38.4–76.8)
NEUTROS ABS: 14.8 10*3/uL — AB (ref 1.5–6.5)
Platelets: 176 10*3/uL (ref 145–400)
RBC: 3.31 10*6/uL — ABNORMAL LOW (ref 3.70–5.45)
RDW: 13.2 % (ref 11.2–14.5)
WBC: 18.4 10*3/uL — ABNORMAL HIGH (ref 3.9–10.3)

## 2014-12-30 LAB — COMPREHENSIVE METABOLIC PANEL (CC13)
ALBUMIN: 3.4 g/dL — AB (ref 3.5–5.0)
ALT: 27 U/L (ref 0–55)
AST: 15 U/L (ref 5–34)
Alkaline Phosphatase: 34 U/L — ABNORMAL LOW (ref 40–150)
Anion Gap: 8 mEq/L (ref 3–11)
BUN: 44.3 mg/dL — ABNORMAL HIGH (ref 7.0–26.0)
CALCIUM: 9.7 mg/dL (ref 8.4–10.4)
CO2: 25 meq/L (ref 22–29)
Chloride: 104 mEq/L (ref 98–109)
Creatinine: 2 mg/dL — ABNORMAL HIGH (ref 0.6–1.1)
EGFR: 23 mL/min/{1.73_m2} — ABNORMAL LOW (ref >90)
GLUCOSE: 152 mg/dL — AB (ref 70–140)
Potassium: 4.9 mEq/L (ref 3.5–5.1)
Sodium: 137 mEq/L (ref 136–145)
Total Bilirubin: 0.46 mg/dL (ref 0.20–1.20)
Total Protein: 6.2 g/dL — ABNORMAL LOW (ref 6.4–8.3)

## 2015-01-01 DIAGNOSIS — D472 Monoclonal gammopathy: Secondary | ICD-10-CM | POA: Diagnosis not present

## 2015-01-01 DIAGNOSIS — Z6828 Body mass index (BMI) 28.0-28.9, adult: Secondary | ICD-10-CM | POA: Diagnosis not present

## 2015-01-01 DIAGNOSIS — R51 Headache: Secondary | ICD-10-CM | POA: Diagnosis not present

## 2015-01-01 DIAGNOSIS — R05 Cough: Secondary | ICD-10-CM | POA: Diagnosis not present

## 2015-01-01 DIAGNOSIS — R5381 Other malaise: Secondary | ICD-10-CM | POA: Diagnosis not present

## 2015-01-01 LAB — SPEP & IFE WITH QIG
ALBUMIN ELP: 3.5 g/dL — AB (ref 3.8–4.8)
ALPHA-2-GLOBULIN: 0.9 g/dL (ref 0.5–0.9)
Abnormal Protein Band1: 0.1 g/dL
Alpha-1-Globulin: 0.4 g/dL — ABNORMAL HIGH (ref 0.2–0.3)
Beta 2: 0.3 g/dL (ref 0.2–0.5)
Beta Globulin: 0.3 g/dL — ABNORMAL LOW (ref 0.4–0.6)
GAMMA GLOBULIN: 0.6 g/dL — AB (ref 0.8–1.7)
IGM, SERUM: 43 mg/dL — AB (ref 52–322)
IgA: 76 mg/dL (ref 69–380)
IgG (Immunoglobin G), Serum: 612 mg/dL — ABNORMAL LOW (ref 690–1700)
Total Protein, Serum Electrophoresis: 6 g/dL — ABNORMAL LOW (ref 6.1–8.1)

## 2015-01-01 LAB — KAPPA/LAMBDA LIGHT CHAINS
KAPPA LAMBDA RATIO: 1.36 (ref 0.26–1.65)
Kappa free light chain: 3.67 mg/dL — ABNORMAL HIGH (ref 0.33–1.94)
LAMBDA FREE LGHT CHN: 2.7 mg/dL — AB (ref 0.57–2.63)

## 2015-01-04 LAB — 24 HR URINE,KAPPA/LAMBDA LIGHT CHAINS: 24H Urine Volume: 1700 mL/24 h

## 2015-01-04 LAB — UIFE/LIGHT CHAINS/TP QN, 24-HR UR
ALBUMIN, U: DETECTED
Alpha 1, Urine: DETECTED — AB
Alpha 2, Urine: DETECTED — AB
BETA UR: DETECTED — AB
Gamma Globulin, Urine: DETECTED — AB
TIME-UPE24: 24 h
TOTAL PROTEIN, URINE-UR/DAY: 340 mg/d — AB (ref <150)
Total Protein, Urine: 20 mg/dL (ref 5–24)
VOLUME, URINE-UPE24: 1700 mL

## 2015-01-06 ENCOUNTER — Telehealth: Payer: Self-pay | Admitting: Hematology and Oncology

## 2015-01-06 ENCOUNTER — Ambulatory Visit (HOSPITAL_BASED_OUTPATIENT_CLINIC_OR_DEPARTMENT_OTHER): Payer: Medicare Other

## 2015-01-06 ENCOUNTER — Encounter: Payer: Self-pay | Admitting: Hematology and Oncology

## 2015-01-06 ENCOUNTER — Ambulatory Visit (HOSPITAL_BASED_OUTPATIENT_CLINIC_OR_DEPARTMENT_OTHER): Payer: Medicare Other | Admitting: Hematology and Oncology

## 2015-01-06 VITALS — BP 119/53 | HR 66 | Temp 97.8°F | Resp 18 | Ht 63.0 in | Wt 158.0 lb

## 2015-01-06 DIAGNOSIS — N184 Chronic kidney disease, stage 4 (severe): Secondary | ICD-10-CM

## 2015-01-06 DIAGNOSIS — D631 Anemia in chronic kidney disease: Secondary | ICD-10-CM

## 2015-01-06 DIAGNOSIS — M7661 Achilles tendinitis, right leg: Secondary | ICD-10-CM

## 2015-01-06 DIAGNOSIS — N189 Chronic kidney disease, unspecified: Secondary | ICD-10-CM

## 2015-01-06 DIAGNOSIS — D472 Monoclonal gammopathy: Secondary | ICD-10-CM | POA: Diagnosis not present

## 2015-01-06 DIAGNOSIS — R309 Painful micturition, unspecified: Secondary | ICD-10-CM | POA: Diagnosis not present

## 2015-01-06 DIAGNOSIS — M7662 Achilles tendinitis, left leg: Secondary | ICD-10-CM | POA: Diagnosis not present

## 2015-01-06 DIAGNOSIS — R300 Dysuria: Secondary | ICD-10-CM

## 2015-01-06 HISTORY — DX: Dysuria: R30.0

## 2015-01-06 LAB — URINALYSIS, MICROSCOPIC - CHCC
BILIRUBIN (URINE): NEGATIVE
Blood: NEGATIVE
GLUCOSE UR CHCC: NEGATIVE mg/dL
KETONES: NEGATIVE mg/dL
LEUKOCYTE ESTERASE: NEGATIVE
NITRITE: NEGATIVE
PROTEIN: 30 mg/dL
RBC / HPF: NEGATIVE (ref 0–2)
SPECIFIC GRAVITY, URINE: 1.015 (ref 1.003–1.035)
Urobilinogen, UR: 0.2 mg/dL (ref 0.2–1)
pH: 5 (ref 4.6–8.0)

## 2015-01-06 NOTE — Telephone Encounter (Signed)
Gave and printed appt sched and avs for pt for July 2017 °

## 2015-01-06 NOTE — Assessment & Plan Note (Signed)
She just completed a course of antibiotics. The patient requested a urinalysis. Clinical examination did not revealed vagina yeast infection

## 2015-01-06 NOTE — Progress Notes (Signed)
[place] OFFICE PROGRESS NOTE  Patient Care Team: [name], MD as PCP - General (Internal Medicine) Corliss Parish, MD as Consulting Physician (Nephrology) Pixie Casino, MD as Consulting Physician (Cardiology)  SUMMARY OF ONCOLOGIC HISTORY:  Rebecca [name] is here because of recently discovered IgG lambda MGUS. Patient herself appears to have some memory deficit. A lot of history was obtained through a review of chart and collaboration of history with her daughter She denies history of abnormal bone pain or bone fracture. The patient had history of polymyalgia rheumatica and had been on high-dose prednisone therapy in the past, currently on a slow prednisone taper. Patient denies history of recurrent infection or atypical infections such as shingles of meningitis. Denies chills, night sweats, anorexia or abnormal weight loss. She had chronic bruising from antiplatelet agent and prednisone therapy. She has occasional epistaxis but she thought to be related to the use of CPAP.  INTERVAL HISTORY: Please see below for problem oriented charting. She had recent pneumonia. She is prescribed and recently completed a course of higher dose prednisone & anti-biotics. Since then, she complained of pain in both heels. She also have trouble/burning sensation when she goes to the bathroom to urinate. Denies fevers or chills. She has easy bruising.  REVIEW OF SYSTEMS:   Constitutional: Denies fevers, chills or abnormal weight loss Eyes: Denies blurriness of vision Ears, nose, mouth, throat, and face: Denies mucositis or sore throat Respiratory: Denies cough, dyspnea or wheezes Cardiovascular: Denies palpitation, chest discomfort or lower extremity swelling Gastrointestinal:  Denies nausea, heartburn or change in bowel habits Lymphatics: Denies new lymphadenopathy  Neurological:Denies numbness, tingling or new weaknesses Behavioral/Psych: Mood is stable, no new changes   All other systems were reviewed with the patient and are negative.  I have reviewed the past medical history, past surgical history, social history and family history with the patient and they are unchanged from previous note.  ALLERGIES:  has No Known Allergies.  MEDICATIONS:  Current Outpatient Prescriptions  Medication Sig Dispense Refill  . acetaminophen (TYLENOL) 500 MG tablet Take 1,000 mg by mouth every 6 (six) hours as needed (pain). 2 tablets every morning and then 6 hours as needed    . albuterol (PROVENTIL HFA;VENTOLIN HFA) 108 (90 BASE) MCG/ACT inhaler Inhale 2 puffs into the lungs every 6 (six) hours as needed for wheezing or shortness of breath.    Marland Kitchen alendronate (FOSAMAX) 70 MG tablet Take 1 tablet (70 mg total) by mouth every 7 (seven) days. Take with a full glass of water on an empty stomach. 4 tablet 5  . cetirizine (ZYRTEC) 10 MG tablet Take 10 mg by mouth at bedtime.     . clopidogrel (PLAVIX) 75 MG tablet Take 1 tablet (75 mg total) by mouth daily with breakfast. 30 tablet 0  . clozapine (CLOZARIL) 200 MG tablet Take 300 mg by mouth at bedtime. Take 1.5 tablets (300 mg) at bedtime    . Cranberry (SM CRANBERRY) 300 MG tablet Take 300 mg by mouth at bedtime.     Marland Kitchen diltiazem (DILACOR XR) 180 MG 24 hr capsule Take 180 mg by mouth daily.    Marland Kitchen docusate sodium (COLACE) 100 MG capsule Take 200 mg by mouth at bedtime.  10 capsule 0  . ferrous sulfate 325 (65 FE) MG EC tablet Take 325 mg by mouth 2 (two) times daily.      . Fluticasone-Salmeterol (ADVAIR) 250-50 MCG/DOSE AEPB Inhale 1 puff into the lungs at bedtime.    Marland Kitchen  isosorbide mononitrate (IMDUR) 60 MG 24 hr tablet Take 1 tablet (60 mg total) by mouth daily. 30 tablet 6  . levothyroxine (SYNTHROID, LEVOTHROID) 75 MCG tablet Take 75 mcg by mouth daily before breakfast.    . metoprolol succinate (TOPROL-XL) 100 MG 24 hr tablet Take 100 mg by mouth daily. Take with or immediately following a meal.    . Multiple Vitamin  (MULTIVITAMIN WITH MINERALS) TABS Take 1 tablet by mouth at bedtime. Psychiatrist    . nitroGLYCERIN (NITROSTAT) 0.4 MG SL tablet Place 1 tablet (0.4 mg total) under the tongue every 5 (five) minutes as needed. For chest pain 25 tablet 3  . pantoprazole (PROTONIX) 40 MG tablet Take 1 tablet (40 mg total) by mouth daily. 30 tablet 11  . Polyethyl Glycol-Propyl Glycol (SYSTANE OP) Apply 1 drop to eye 2 (two) times daily as needed (dry eyes).    . polyethylene glycol (MIRALAX / GLYCOLAX) packet Take 17 g by mouth daily as needed (constipation).    . polyethylene glycol powder (GLYCOLAX/MIRALAX) powder take 17GM (DISSOLVED IN WATER) by mouth twice a day  1  . pravastatin (PRAVACHOL) 20 MG tablet Take 20 mg by mouth at bedtime.    . predniSONE (DELTASONE) 1 MG tablet Take 6 mg by mouth daily with breakfast.     . predniSONE (DELTASONE) 20 MG tablet TAKE 2 TABLETS BY MOUTH ONCE DAILY FOR 5 DAYS, THEN 1 TAB DAILY F...  (REFER TO PRESCRIPTION NOTES).  0  . Pseudoephedrine-Guaifenesin (GUAIFEN-PSE PO) Take 600 mg by mouth daily as needed (congestion).     . SPIRIVA RESPIMAT 2.5 MCG/ACT AERS   0  . temazepam (RESTORIL) 15 MG capsule Take 15 mg by mouth at bedtime as needed.     . tiotropium (SPIRIVA) 18 MCG inhalation capsule Place 18 mcg into inhaler and inhale daily.    . traMADol (ULTRAM) 50 MG tablet Take 50 mg by mouth 2 (two) times daily as needed.    . traMADol (ULTRAM-ER) 300 MG 24 hr tablet Take 300 mg by mouth daily.    . Vitamin D, Ergocalciferol, (DRISDOL) 50000 UNITS CAPS capsule Take 1 capsule (50,000 Units total) by mouth every 7 (seven) days. 4 capsule 0   No current facility-administered medications for this visit.    PHYSICAL EXAMINATION: ECOG PERFORMANCE STATUS: 1 - Symptomatic but completely ambulatory  Filed Vitals:   01/06/15 1456  BP: 119/53  Pulse: 66  Temp: 97.8 F (36.6 C)  Resp: 18   Filed Weights   01/06/15 1456  Weight: 158 lb (71.668 kg)    GENERAL:alert,  no distress and comfortable. She appears cushingoid SKIN: she have significant bruises. No petechiae rash EYES: normal, Conjunctiva are pink and non-injected, sclera clear OROPHARYNX:no exudate, no erythema and lips, buccal mucosa, and tongue normal  NECK: supple, thyroid normal size, non-tender, without nodularity LYMPH:  no palpable lymphadenopathy in the cervical, axillary or inguinal LUNGS: normal breathing effort, diffuse wheezes HEART: regular rate & rhythm and no murmurs and no lower extremity edema ABDOMEN:abdomen soft, non-tender and normal bowel sounds Musculoskeletal:no cyanosis of digits and no clubbing  NEURO: alert & oriented x 3 with fluent speech, no focal motor/sensory deficits Vaginal exam: Did not appreciate any yeast infection LABORATORY DATA:  I have reviewed the data as listed    Component Value Date/Time   NA 137 12/30/2014 1416   NA 131* 10/16/2013 1553   NA 137 06/21/2012   K 4.9 12/30/2014 1416   K 4.5 10/16/2013 1553  CL 93* 10/16/2013 1553   CO2 25 12/30/2014 1416   CO2 24 10/16/2013 1553   GLUCOSE 152* 12/30/2014 1416   GLUCOSE 131* 10/16/2013 1553   BUN 44.3* 12/30/2014 1416   BUN 37* 10/16/2013 1553   BUN 33* 06/21/2012   CREATININE 2.0* 12/30/2014 1416   CREATININE 1.68* 10/16/2013 2027   CREATININE 1.85* 07/16/2013 1450   CREATININE 1.6* 06/21/2012   CALCIUM 9.7 12/30/2014 1416   CALCIUM 10.0 10/16/2013 1553   CALCIUM 10.0 03/21/2008 2229   PROT 6.2* 12/30/2014 1416   PROT 6.5 10/16/2013 1553   ALBUMIN 3.4* 12/30/2014 1416   ALBUMIN 3.2* 10/16/2013 1553   AST 15 12/30/2014 1416   AST 15 10/16/2013 1553   ALT 27 12/30/2014 1416   ALT 19 10/16/2013 1553   ALKPHOS 34* 12/30/2014 1416   ALKPHOS 39 10/16/2013 1553   BILITOT 0.46 12/30/2014 1416   BILITOT 0.3 10/16/2013 1553   GFRNONAA 29* 10/16/2013 2027   GFRAA 33* 10/16/2013 2027    No results found for: SPEP, UPEP  Lab Results  Component Value Date   WBC 18.4* 12/30/2014    NEUTROABS 14.8* 12/30/2014   HGB 10.3* 12/30/2014   HCT 31.2* 12/30/2014   MCV 94.2 12/30/2014   PLT 176 12/30/2014      Chemistry      Component Value Date/Time   NA 137 12/30/2014 1416   NA 131* 10/16/2013 1553   NA 137 06/21/2012   K 4.9 12/30/2014 1416   K 4.5 10/16/2013 1553   CL 93* 10/16/2013 1553   CO2 25 12/30/2014 1416   CO2 24 10/16/2013 1553   BUN 44.3* 12/30/2014 1416   BUN 37* 10/16/2013 1553   BUN 33* 06/21/2012   CREATININE 2.0* 12/30/2014 1416   CREATININE 1.68* 10/16/2013 2027   CREATININE 1.85* 07/16/2013 1450   CREATININE 1.6* 06/21/2012   GLU 77 06/21/2012      Component Value Date/Time   CALCIUM 9.7 12/30/2014 1416   CALCIUM 10.0 10/16/2013 1553   CALCIUM 10.0 03/21/2008 2229   ALKPHOS 34* 12/30/2014 1416   ALKPHOS 39 10/16/2013 1553   AST 15 12/30/2014 1416   AST 15 10/16/2013 1553   ALT 27 12/30/2014 1416   ALT 19 10/16/2013 1553   BILITOT 0.46 12/30/2014 1416   BILITOT 0.3 10/16/2013 1553      ASSESSMENT & PLAN:  MGUS (monoclonal gammopathy of unknown significance) Clinically, she does not appear to have signs and symptoms to suggest multiple myeloma. 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 I would recommend rechecking her blood work and exam annually I spent a lot of time talking to her daughter the pathophysiology of monoclonal gammopathy and the natural history of disease progression. I addressed all her questions and concerns    Anemia in chronic renal disease This is likely anemia of chronic disease. The patient denies recent history of bleeding such as epistaxis, hematuria or hematochezia. She is asymptomatic from the anemia. We will observe for now.     Chronic kidney disease, stage IV (severe) This is unlikely due to MGUS. She will continue followup with a nephrologist    Tendonitis, Achilles THis is an expected side-effects from recent levaquin. Clinically no evidence of tendon rupture I recommend  observation only  Dysuria She just completed a course of antibiotics. The patient requested a urinalysis. Clinical examination did not revealed vagina yeast infection   Orders Placed This Encounter  Procedures  . Urine culture    Standing Status:  Future     Number of Occurrences: 1     Standing Expiration Date: 02/10/2016  . Urinalysis, Microscopic - [facility]    Standing Status: Future     Number of Occurrences: 1     Standing Expiration Date: 02/10/2016  . Comprehensive metabolic panel    Standing Status: Future     Number of Occurrences:      Standing Expiration Date: 02/10/2016  . CBC with Differential/Platelet    Standing Status: Future     Number of Occurrences:      Standing Expiration Date: 02/10/2016  . Lactate dehydrogenase    Standing Status: Future     Number of Occurrences:      Standing Expiration Date: 02/10/2016  . SPEP & IFE with QIG    Standing Status: Future     Number of Occurrences:      Standing Expiration Date: 02/10/2016  . Kappa/lambda light chains    Standing Status: Future     Number of Occurrences:      Standing Expiration Date: 02/10/2016   All questions were answered. The patient knows to call the clinic with any problems, questions or concerns. No barriers to learning was detected. I spent 25 minutes counseling the patient face to face. The total time spent in the appointment was 30 minutes and more than 50% was on counseling and review of test results     [facility], [place], MD 01/06/2015 4:25 PM

## 2015-01-06 NOTE — Assessment & Plan Note (Signed)
This is likely anemia of chronic disease. The patient denies recent history of bleeding such as epistaxis, hematuria or hematochezia. She is asymptomatic from the anemia. We will observe for now.  

## 2015-01-06 NOTE — Assessment & Plan Note (Signed)
Clinically, she does not appear to have signs and symptoms to suggest multiple myeloma. 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 I would recommend rechecking her blood work and exam annually I spent a lot of time talking to her daughter the pathophysiology of monoclonal gammopathy and the natural history of disease progression. I addressed all her questions and concerns

## 2015-01-06 NOTE — Assessment & Plan Note (Signed)
THis is an expected side-effects from recent levaquin. Clinically no evidence of tendon rupture I recommend observation only

## 2015-01-06 NOTE — Assessment & Plan Note (Signed)
This is unlikely due to MGUS. She will continue followup with a nephrologist

## 2015-01-07 ENCOUNTER — Encounter: Payer: Self-pay | Admitting: Internal Medicine

## 2015-01-07 ENCOUNTER — Ambulatory Visit (INDEPENDENT_AMBULATORY_CARE_PROVIDER_SITE_OTHER): Payer: Medicare Other | Admitting: Internal Medicine

## 2015-01-07 VITALS — BP 128/72 | HR 72 | Ht 63.0 in | Wt 158.8 lb

## 2015-01-07 DIAGNOSIS — R079 Chest pain, unspecified: Secondary | ICD-10-CM | POA: Diagnosis not present

## 2015-01-07 DIAGNOSIS — E7800 Pure hypercholesterolemia, unspecified: Secondary | ICD-10-CM

## 2015-01-07 DIAGNOSIS — I2510 Atherosclerotic heart disease of native coronary artery without angina pectoris: Secondary | ICD-10-CM | POA: Diagnosis not present

## 2015-01-07 DIAGNOSIS — I6502 Occlusion and stenosis of left vertebral artery: Secondary | ICD-10-CM | POA: Diagnosis not present

## 2015-01-07 LAB — URINE CULTURE

## 2015-01-07 MED ORDER — NITROGLYCERIN 0.4 MG SL SUBL: 0.4000 mg | SUBLINGUAL_TABLET | SUBLINGUAL | Status: DC | PRN

## 2015-01-07 NOTE — Progress Notes (Signed)
01/07/2015   PCP: [name], MD   Chief Complaint  Patient presents with  . Follow-up    feels like heart beats a skip. pain in her legs, no swelling.no other concerns    Primary Cardiologist: Dr. Debara Pickett  CC: tired, cough, "just getting over pneumonia"  HPI:  35 YOWF presents today with her daughter after developing chest pain, Rt sided yesterday.  Pt had walked- her usual exercise in the morning, later while watching TV she developed Rt side chest pain, the pain then moved to mid sternal area then the rt arm.  Her throat felt strange also.  She had mild nausea.  Her daughter called our office and we instructed her to take NTG sl.  The patient was too afraid.  She took a lozenger that helped her throat.  Pain resolved within 15 min.    She has hx. Of CAD with cath in 2006 and rec'd Taxus DES of the mid RCA.  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  Last lexiscan myoview 09/2012 was low risk scan.  Echo 07/2012 EF 55-60%, mild hypokinesis of inf wall,  Grade 1 diastolic dysfunction.  Other hx as below.  With her negative nuc her Imdur had been d/c'd.  She developed chest pain and it was restarted at 30 mg.  Previously she had been on 60 mg.  At her last office visit, [name] started her back on Imdur 60 mg daily.  She now reports her chest pain has resolved.  Her main complaint at this time include ankle pain and some low back pain. She denies any recurrent chest pain. She had recent laboratory work performed at her primary care doctor's office this past week which shows moderate chronic kidney disease with a GFR of 31. No liver enzyme abnormalities. Well controlled cholesterol at total cholesterol 141, triglycerides 114, [name] 51 LDL 67. Hemoglobin A1c 5.5, TSH 1.7. ApoB is 58.  Mrs. Schwartzkopf follows up today from recent hospitalization where she possibly had a mild TIA. It is not clear however her aspirin was switched over to Plavix. She recently had an episode this past weekend of  right-sided chest discomfort that went up to a neck. She was then complaining of some throat pain. This caused great concern her daughter who state up with her all night but did not note any further symptoms. She did take aspirin with some relief. She's afraid to take nitroglycerin but is on long-acting isosorbide. EKG is normal the office today.  I saw Ms. Pulver back in the office today. Recently she had a pneumonia and was treated with Levaquin. Apparently she had some tendinitis from this. She seems to be recovering though very quickly and is also on stair eyes. She's not had any significant chest pain and has not needed to take short-acting nitrates. Her EKG shows normal sinus rhythm today.    No Known Allergies  Current Outpatient Prescriptions  Medication Sig Dispense Refill  . acetaminophen (TYLENOL) 500 MG tablet Take 1,000 mg by mouth every 6 (six) hours as needed (pain). 2 tablets every morning and then 6 hours as needed    . albuterol (PROVENTIL HFA;VENTOLIN HFA) 108 (90 BASE) MCG/ACT inhaler Inhale 2 puffs into the lungs every 6 (six) hours as needed for wheezing or shortness of breath.    Marland Kitchen alendronate (FOSAMAX) 70 MG tablet Take 1 tablet (70 mg total) by mouth every 7 (seven) days. Take with a full glass of water on an empty  stomach. 4 tablet 5  . cetirizine (ZYRTEC) 10 MG tablet Take 10 mg by mouth at bedtime.     . clopidogrel (PLAVIX) 75 MG tablet Take 1 tablet (75 mg total) by mouth daily with breakfast. 30 tablet 0  . clozapine (CLOZARIL) 200 MG tablet Take 300 mg by mouth at bedtime. Take 1.5 tablets (300 mg) at bedtime    . Cranberry (SM CRANBERRY) 300 MG tablet Take 300 mg by mouth at bedtime.     Marland Kitchen diltiazem (DILACOR XR) 180 MG 24 hr capsule Take 180 mg by mouth daily.    Marland Kitchen docusate sodium (COLACE) 100 MG capsule Take 200 mg by mouth at bedtime.  10 capsule 0  . ferrous sulfate 325 (65 FE) MG EC tablet Take 325 mg by mouth 2 (two) times daily.      . Fluticasone-Salmeterol  (ADVAIR) 250-50 MCG/DOSE AEPB Inhale 1 puff into the lungs at bedtime.    . isosorbide mononitrate (IMDUR) 60 MG 24 hr tablet Take 1 tablet (60 mg total) by mouth daily. 30 tablet 6  . levothyroxine (SYNTHROID, LEVOTHROID) 75 MCG tablet Take 75 mcg by mouth daily before breakfast.    . metoprolol succinate (TOPROL-XL) 100 MG 24 hr tablet Take 100 mg by mouth daily. Take with or immediately following a meal.    . Multiple Vitamin (MULTIVITAMIN WITH MINERALS) TABS Take 1 tablet by mouth at bedtime. Psychiatrist    . nitroGLYCERIN (NITROSTAT) 0.4 MG SL tablet Place 1 tablet (0.4 mg total) under the tongue every 5 (five) minutes as needed. For chest pain 25 tablet 3  . pantoprazole (PROTONIX) 40 MG tablet Take 1 tablet (40 mg total) by mouth daily. 30 tablet 11  . Polyethyl Glycol-Propyl Glycol (SYSTANE OP) Apply 1 drop to eye 2 (two) times daily as needed (dry eyes).    . polyethylene glycol (MIRALAX / GLYCOLAX) packet Take 17 g by mouth daily as needed (constipation).    . polyethylene glycol powder (GLYCOLAX/MIRALAX) powder take 17GM (DISSOLVED IN WATER) by mouth twice a day  1  . pravastatin (PRAVACHOL) 20 MG tablet Take 20 mg by mouth at bedtime.    . predniSONE (DELTASONE) 1 MG tablet Take 6 mg by mouth daily with breakfast.     . predniSONE (DELTASONE) 20 MG tablet TAKE 2 TABLETS BY MOUTH ONCE DAILY FOR 5 DAYS, THEN 1 TAB DAILY F...  (REFER TO PRESCRIPTION NOTES).  0  . Pseudoephedrine-Guaifenesin (GUAIFEN-PSE PO) Take 600 mg by mouth daily as needed (congestion).     . SPIRIVA RESPIMAT 2.5 MCG/ACT AERS   0  . temazepam (RESTORIL) 15 MG capsule Take 15 mg by mouth at bedtime as needed.     . tiotropium (SPIRIVA) 18 MCG inhalation capsule Place 18 mcg into inhaler and inhale daily.    . traMADol (ULTRAM) 50 MG tablet Take 50 mg by mouth 2 (two) times daily as needed.    . traMADol (ULTRAM-ER) 300 MG 24 hr tablet Take 300 mg by mouth daily.    . Vitamin D, Ergocalciferol, (DRISDOL) 50000  UNITS CAPS capsule Take 1 capsule (50,000 Units total) by mouth every 7 (seven) days. 4 capsule 0   No current facility-administered medications for this visit.    Past Medical History  Diagnosis Date  . Diabetes mellitus   . Frozen shoulder     right  . Obesity   . Renal insufficiency   . COPD (chronic obstructive pulmonary disease)   . Shortness of breath   .  Stroke   . CAD (coronary artery disease) 2006    Taxus stent of mid RCA. negative myoview 2014 and normal LV function  . Diastolic heart failure, NYHA class 1   . Polymyalgia rheumatica   . 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 Dysuria 01/06/2015    Past Surgical History  Procedure Laterality Date  . Partial hysterectomy  1995  . Abdominal hysterectomy    . Cholecystectomy    . Coronary angioplasty with stent placement  2006    Taxus DES to RCA, 50 % residual    EOF:[id number]:IT colds or fevers, no weight changes Skin:no rashes or ulcers HEENT:no blurred vision, no congestion CV:see HPI PUL:see HPI GI:no diarrhea constipation or melena, no indigestion GU:no hematuria, no dysuria MS:+ joint pain, no claudication Neuro:no syncope, no lightheadedness Endo:+ diabetes, + thyroid disease  PHYSICAL EXAM BP 128/72 mmHg  Pulse 72  Ht 5\' 3"  (1.6 m)  Wt 158 lb 12.8 oz (72.031 kg)  BMI 28.14 kg/m2 GEN: Awake, NAD HEENT: PERRLA, EOMI LUNGS: CLEAR ABD: S/NT, +BS EXT: No edema NEURO: Grossly intact PSYCH: Normal mood, affect  EKG: NSR at 72  ASSESSMENT AND PLAN Patient Active Problem List   Diagnosis Date Noted  . Dysuria 01/06/2015  . Tendonitis, Achilles 01/06/2015  . Chronic kidney disease, stage IV (severe) 01/28/2014  . 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 Leukocytosis 01/28/2014  . Memory loss 11/26/2013  . TIA (transient ischemic attack) 10/16/2013  . COPD exacerbation 07/16/2013  . Snoring 06/29/2013  . Diabetes mellitus, type 2 06/29/2013    . Chest pain at rest 03/14/2013  . Estrogen deficiency 02/06/2013  . Bruising 01/02/2013  . Osteoporosis screening 01/02/2013  . Breast cancer screening 01/02/2013  . Personal history of fall 10/19/2012  . Polymyalgia rheumatica 10/03/2012  . Weakness 09/27/2012  . Trochanteric bursitis of right hip 04/11/2012  . At high risk for falls 02/28/2012  . Lower urinary tract symptoms 01/27/2012  . Chronic constipation 01/26/2012  . Memory impairment 12/01/2010  . History of small bowel obstruction 11/04/2010  . COPD, MILD 07/09/2010  . 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 CONSTIPATION, CHRONIC 12/12/2008  . PRURITUS 03/06/2008  . SCHIZOAFFECTIVE DISORDER 01/10/2008  . GOUT 09/11/2007  . LOW BACK PAIN, CHRONIC 07/05/2007  . HYPERCHOLESTEROLEMIA 01/25/2007  . HYPOTHYROIDISM, UNSPECIFIED 08/11/2006  . OBESITY, NOS 08/11/2006  . Anemia in chronic renal disease 08/11/2006  . HYPERTENSION, BENIGN SYSTEMIC 08/11/2006  . Coronary atherosclerosis 08/11/2006  . HEMORRHOIDS, NOS 08/11/2006  . GASTROESOPHAGEAL REFLUX, NO ESOPHAGITIS 08/11/2006  . RENAL INSUFFICIENCY, CHRONIC 08/11/2006  . ARTHRITIS 08/11/2006   PLAN: 1.  Ms. Kasparek appears to be stable from a coronary standpoint. She had a recent pneumonia but is recovering from that after a course of steroid-induced and Levaquin. She's not required any additional short acting nitroglycerin. Overall she's doing fairly well and will plan to see her back annually or sooner as necessary.  Pixie Casino, MD, [facility] Attending Cardiologist [place]

## 2015-01-07 NOTE — Patient Instructions (Signed)
Your physician wants you to follow-up in: 1 year with Dr. Hilty. You will receive a reminder letter in the mail two months in advance. If you don't receive a letter, please call our office to schedule the follow-up appointment.  

## 2015-01-08 ENCOUNTER — Encounter: Payer: Self-pay | Admitting: Internal Medicine

## 2015-01-08 ENCOUNTER — Telehealth: Payer: Self-pay | Admitting: *Deleted

## 2015-01-08 NOTE — Telephone Encounter (Signed)
-----   Message from Heath Lark, MD sent at 01/08/2015  8:06 AM EDT ----- Regarding: urine test Pls call her daughter and let her know urine test is negative for infection

## 2015-01-08 NOTE — Telephone Encounter (Signed)
Left message for daughter with results below

## 2015-01-14 ENCOUNTER — Ambulatory Visit (INDEPENDENT_AMBULATORY_CARE_PROVIDER_SITE_OTHER): Payer: Medicare Other | Admitting: Podiatry

## 2015-01-14 DIAGNOSIS — E1142 Type 2 diabetes mellitus with diabetic polyneuropathy: Secondary | ICD-10-CM

## 2015-01-14 DIAGNOSIS — M2041 Other hammer toe(s) (acquired), right foot: Secondary | ICD-10-CM

## 2015-01-14 DIAGNOSIS — J449 Chronic obstructive pulmonary disease, unspecified: Secondary | ICD-10-CM | POA: Diagnosis not present

## 2015-01-14 DIAGNOSIS — M2042 Other hammer toe(s) (acquired), left foot: Secondary | ICD-10-CM

## 2015-01-14 DIAGNOSIS — J209 Acute bronchitis, unspecified: Secondary | ICD-10-CM | POA: Diagnosis not present

## 2015-01-14 DIAGNOSIS — R05 Cough: Secondary | ICD-10-CM | POA: Diagnosis not present

## 2015-01-14 DIAGNOSIS — E1149 Type 2 diabetes mellitus with other diabetic neurological complication: Secondary | ICD-10-CM

## 2015-01-14 DIAGNOSIS — G629 Polyneuropathy, unspecified: Secondary | ICD-10-CM

## 2015-01-14 DIAGNOSIS — Z6827 Body mass index (BMI) 27.0-27.9, adult: Secondary | ICD-10-CM | POA: Diagnosis not present

## 2015-01-14 DIAGNOSIS — Q6650 Congenital pes planus, unspecified foot: Secondary | ICD-10-CM

## 2015-01-14 DIAGNOSIS — E1342 Other specified diabetes mellitus with diabetic polyneuropathy: Secondary | ICD-10-CM

## 2015-01-14 DIAGNOSIS — E1140 Type 2 diabetes mellitus with diabetic neuropathy, unspecified: Secondary | ICD-10-CM | POA: Diagnosis not present

## 2015-01-14 DIAGNOSIS — Z79899 Other long term (current) drug therapy: Secondary | ICD-10-CM | POA: Diagnosis not present

## 2015-01-14 NOTE — Progress Notes (Signed)
Patient ID: Rebecca [name], female   DOB: 1941-01-21, 74 y.o.   MRN: [id number] Patient presents for diabetic shoe pick up, shoes are tried on for good fit.  Patient received 1 Pair Apex Emmy [id number] black in women's 7 wide and 3 pairs custom molded diabetic inserts.  Verbal and written break in and wear instructions given.  Patient will follow up for scheduled routine care.

## 2015-01-14 NOTE — Patient Instructions (Signed)

## 2015-01-21 ENCOUNTER — Ambulatory Visit: Payer: Medicare Other | Admitting: Internal Medicine

## 2015-01-27 DIAGNOSIS — M150 Primary generalized (osteo)arthritis: Secondary | ICD-10-CM | POA: Diagnosis not present

## 2015-01-27 DIAGNOSIS — M810 Age-related osteoporosis without current pathological fracture: Secondary | ICD-10-CM | POA: Diagnosis not present

## 2015-01-27 DIAGNOSIS — M353 Polymyalgia rheumatica: Secondary | ICD-10-CM | POA: Diagnosis not present

## 2015-01-27 DIAGNOSIS — D472 Monoclonal gammopathy: Secondary | ICD-10-CM | POA: Diagnosis not present

## 2015-01-28 ENCOUNTER — Encounter: Payer: Self-pay | Admitting: Internal Medicine

## 2015-01-28 ENCOUNTER — Ambulatory Visit (INDEPENDENT_AMBULATORY_CARE_PROVIDER_SITE_OTHER): Payer: Medicare Other | Admitting: Podiatry

## 2015-01-28 ENCOUNTER — Encounter: Payer: Self-pay | Admitting: Podiatry

## 2015-01-28 VITALS — BP 122/59 | HR 68 | Resp 16

## 2015-01-28 DIAGNOSIS — G629 Polyneuropathy, unspecified: Secondary | ICD-10-CM

## 2015-01-28 DIAGNOSIS — B351 Tinea unguium: Secondary | ICD-10-CM | POA: Diagnosis not present

## 2015-01-28 DIAGNOSIS — E1342 Other specified diabetes mellitus with diabetic polyneuropathy: Secondary | ICD-10-CM

## 2015-01-28 DIAGNOSIS — E1142 Type 2 diabetes mellitus with diabetic polyneuropathy: Secondary | ICD-10-CM

## 2015-01-28 DIAGNOSIS — M79673 Pain in unspecified foot: Secondary | ICD-10-CM

## 2015-01-28 NOTE — Progress Notes (Signed)
She presents today with chief complaint of painful elongated toenails. She states they have sharp incurvated margins or which are painful.  Objective: 74 year old white female alert and oriented. Pulses are strongly palpable bilateral. Vital signs are stable. Her nails are thick yellow dystrophic with mycotic and painful palpation.  Assessment: Pain and limb secondary to onychomycosis 1 through 5 bilateral.  Plan: Debridement of nails 1 through 5 and 6 through 10 bilateral foot.  Dr. Roselind Messier

## 2015-02-14 DIAGNOSIS — G459 Transient cerebral ischemic attack, unspecified: Secondary | ICD-10-CM | POA: Diagnosis not present

## 2015-02-14 DIAGNOSIS — R51 Headache: Secondary | ICD-10-CM | POA: Diagnosis not present

## 2015-02-14 DIAGNOSIS — Z79899 Other long term (current) drug therapy: Secondary | ICD-10-CM | POA: Diagnosis not present

## 2015-02-14 DIAGNOSIS — M66369 Spontaneous rupture of flexor tendons, unspecified lower leg: Secondary | ICD-10-CM | POA: Diagnosis not present

## 2015-02-14 DIAGNOSIS — I6529 Occlusion and stenosis of unspecified carotid artery: Secondary | ICD-10-CM | POA: Diagnosis not present

## 2015-02-14 DIAGNOSIS — Z6827 Body mass index (BMI) 27.0-27.9, adult: Secondary | ICD-10-CM | POA: Diagnosis not present

## 2015-02-18 DIAGNOSIS — S86812A Strain of other muscle(s) and tendon(s) at lower leg level, left leg, initial encounter: Secondary | ICD-10-CM | POA: Diagnosis not present

## 2015-02-25 DIAGNOSIS — Z7952 Long term (current) use of systemic steroids: Secondary | ICD-10-CM | POA: Diagnosis not present

## 2015-02-25 DIAGNOSIS — M150 Primary generalized (osteo)arthritis: Secondary | ICD-10-CM | POA: Diagnosis not present

## 2015-02-25 DIAGNOSIS — M353 Polymyalgia rheumatica: Secondary | ICD-10-CM | POA: Diagnosis not present

## 2015-02-25 DIAGNOSIS — D472 Monoclonal gammopathy: Secondary | ICD-10-CM | POA: Diagnosis not present

## 2015-02-25 DIAGNOSIS — M810 Age-related osteoporosis without current pathological fracture: Secondary | ICD-10-CM | POA: Diagnosis not present

## 2015-03-04 DIAGNOSIS — M1990 Unspecified osteoarthritis, unspecified site: Secondary | ICD-10-CM | POA: Diagnosis not present

## 2015-03-04 DIAGNOSIS — E119 Type 2 diabetes mellitus without complications: Secondary | ICD-10-CM | POA: Diagnosis not present

## 2015-03-04 DIAGNOSIS — J309 Allergic rhinitis, unspecified: Secondary | ICD-10-CM | POA: Diagnosis not present

## 2015-03-04 DIAGNOSIS — Z6828 Body mass index (BMI) 28.0-28.9, adult: Secondary | ICD-10-CM | POA: Diagnosis not present

## 2015-03-04 DIAGNOSIS — I10 Essential (primary) hypertension: Secondary | ICD-10-CM | POA: Diagnosis not present

## 2015-03-04 DIAGNOSIS — Z23 Encounter for immunization: Secondary | ICD-10-CM | POA: Diagnosis not present

## 2015-03-13 DIAGNOSIS — F250 Schizoaffective disorder, bipolar type: Secondary | ICD-10-CM | POA: Diagnosis not present

## 2015-03-20 DIAGNOSIS — Z79899 Other long term (current) drug therapy: Secondary | ICD-10-CM | POA: Diagnosis not present

## 2015-03-26 ENCOUNTER — Encounter: Payer: Self-pay | Admitting: Internal Medicine

## 2015-04-02 ENCOUNTER — Other Ambulatory Visit: Payer: Self-pay

## 2015-04-02 ENCOUNTER — Other Ambulatory Visit: Payer: Self-pay | Admitting: Internal Medicine

## 2015-04-02 DIAGNOSIS — Z1231 Encounter for screening mammogram for malignant neoplasm of breast: Secondary | ICD-10-CM

## 2015-04-17 ENCOUNTER — Ambulatory Visit: Payer: Medicare Other

## 2015-04-17 DIAGNOSIS — Z79899 Other long term (current) drug therapy: Secondary | ICD-10-CM | POA: Diagnosis not present

## 2015-04-19 ENCOUNTER — Other Ambulatory Visit: Payer: Self-pay | Admitting: Internal Medicine

## 2015-04-19 DIAGNOSIS — M8580 Other specified disorders of bone density and structure, unspecified site: Secondary | ICD-10-CM

## 2015-04-24 ENCOUNTER — Ambulatory Visit: Payer: Medicare Other | Admitting: Podiatry

## 2015-04-29 ENCOUNTER — Ambulatory Visit: Payer: Medicare Other | Admitting: Podiatry

## 2015-05-05 DIAGNOSIS — M810 Age-related osteoporosis without current pathological fracture: Secondary | ICD-10-CM | POA: Diagnosis not present

## 2015-05-05 DIAGNOSIS — N183 Chronic kidney disease, stage 3 (moderate): Secondary | ICD-10-CM | POA: Diagnosis not present

## 2015-05-05 DIAGNOSIS — M353 Polymyalgia rheumatica: Secondary | ICD-10-CM | POA: Diagnosis not present

## 2015-05-05 DIAGNOSIS — N390 Urinary tract infection, site not specified: Secondary | ICD-10-CM | POA: Diagnosis not present

## 2015-05-05 DIAGNOSIS — I129 Hypertensive chronic kidney disease with stage 1 through stage 4 chronic kidney disease, or unspecified chronic kidney disease: Secondary | ICD-10-CM | POA: Diagnosis not present

## 2015-05-07 DIAGNOSIS — M25552 Pain in left hip: Secondary | ICD-10-CM | POA: Diagnosis not present

## 2015-05-07 DIAGNOSIS — M25551 Pain in right hip: Secondary | ICD-10-CM | POA: Diagnosis not present

## 2015-05-07 DIAGNOSIS — M150 Primary generalized (osteo)arthritis: Secondary | ICD-10-CM | POA: Diagnosis not present

## 2015-05-07 DIAGNOSIS — D472 Monoclonal gammopathy: Secondary | ICD-10-CM | POA: Diagnosis not present

## 2015-05-07 DIAGNOSIS — M810 Age-related osteoporosis without current pathological fracture: Secondary | ICD-10-CM | POA: Diagnosis not present

## 2015-05-07 DIAGNOSIS — N183 Chronic kidney disease, stage 3 (moderate): Secondary | ICD-10-CM | POA: Diagnosis not present

## 2015-05-07 DIAGNOSIS — M353 Polymyalgia rheumatica: Secondary | ICD-10-CM | POA: Diagnosis not present

## 2015-05-07 DIAGNOSIS — Z7952 Long term (current) use of systemic steroids: Secondary | ICD-10-CM | POA: Diagnosis not present

## 2015-05-12 DIAGNOSIS — N183 Chronic kidney disease, stage 3 (moderate): Secondary | ICD-10-CM | POA: Diagnosis not present

## 2015-05-16 DIAGNOSIS — Z79899 Other long term (current) drug therapy: Secondary | ICD-10-CM | POA: Diagnosis not present

## 2015-05-20 DIAGNOSIS — M150 Primary generalized (osteo)arthritis: Secondary | ICD-10-CM | POA: Diagnosis not present

## 2015-05-20 DIAGNOSIS — D472 Monoclonal gammopathy: Secondary | ICD-10-CM | POA: Diagnosis not present

## 2015-05-20 DIAGNOSIS — Z7952 Long term (current) use of systemic steroids: Secondary | ICD-10-CM | POA: Diagnosis not present

## 2015-05-20 DIAGNOSIS — N183 Chronic kidney disease, stage 3 (moderate): Secondary | ICD-10-CM | POA: Diagnosis not present

## 2015-05-20 DIAGNOSIS — M810 Age-related osteoporosis without current pathological fracture: Secondary | ICD-10-CM | POA: Diagnosis not present

## 2015-05-20 DIAGNOSIS — M353 Polymyalgia rheumatica: Secondary | ICD-10-CM | POA: Diagnosis not present

## 2015-05-29 ENCOUNTER — Ambulatory Visit
Admission: RE | Admit: 2015-05-29 | Discharge: 2015-05-29 | Disposition: A | Discharge status: Home / Self Care | Payer: Medicare Other | Source: Ambulatory Visit | Attending: Internal Medicine | Admitting: Internal Medicine

## 2015-05-29 ENCOUNTER — Ambulatory Visit
Admission: RE | Admit: 2015-05-29 | Discharge: 2015-05-29 | Disposition: A | Discharge status: Home / Self Care | Payer: Medicare Other | Source: Ambulatory Visit

## 2015-05-29 DIAGNOSIS — M85851 Other specified disorders of bone density and structure, right thigh: Secondary | ICD-10-CM | POA: Diagnosis not present

## 2015-05-29 DIAGNOSIS — Z1231 Encounter for screening mammogram for malignant neoplasm of breast: Secondary | ICD-10-CM

## 2015-05-29 DIAGNOSIS — M8580 Other specified disorders of bone density and structure, unspecified site: Secondary | ICD-10-CM

## 2015-05-29 IMAGING — CR DG LUMBAR SPINE COMPLETE 4+V
5 series · 5 of 5 positions shown · non-contrast
Comparison: DG LUMBAR SPINE COMPLETE dated 09/26/2012; CT ABD/PELV
WO CM dated 04/16/2012

CLINICAL DATA: Low back pain

EXAM:
LUMBAR SPINE - COMPLETE 4+ VIEW

[w l-spine a.p. (1 of 2)]
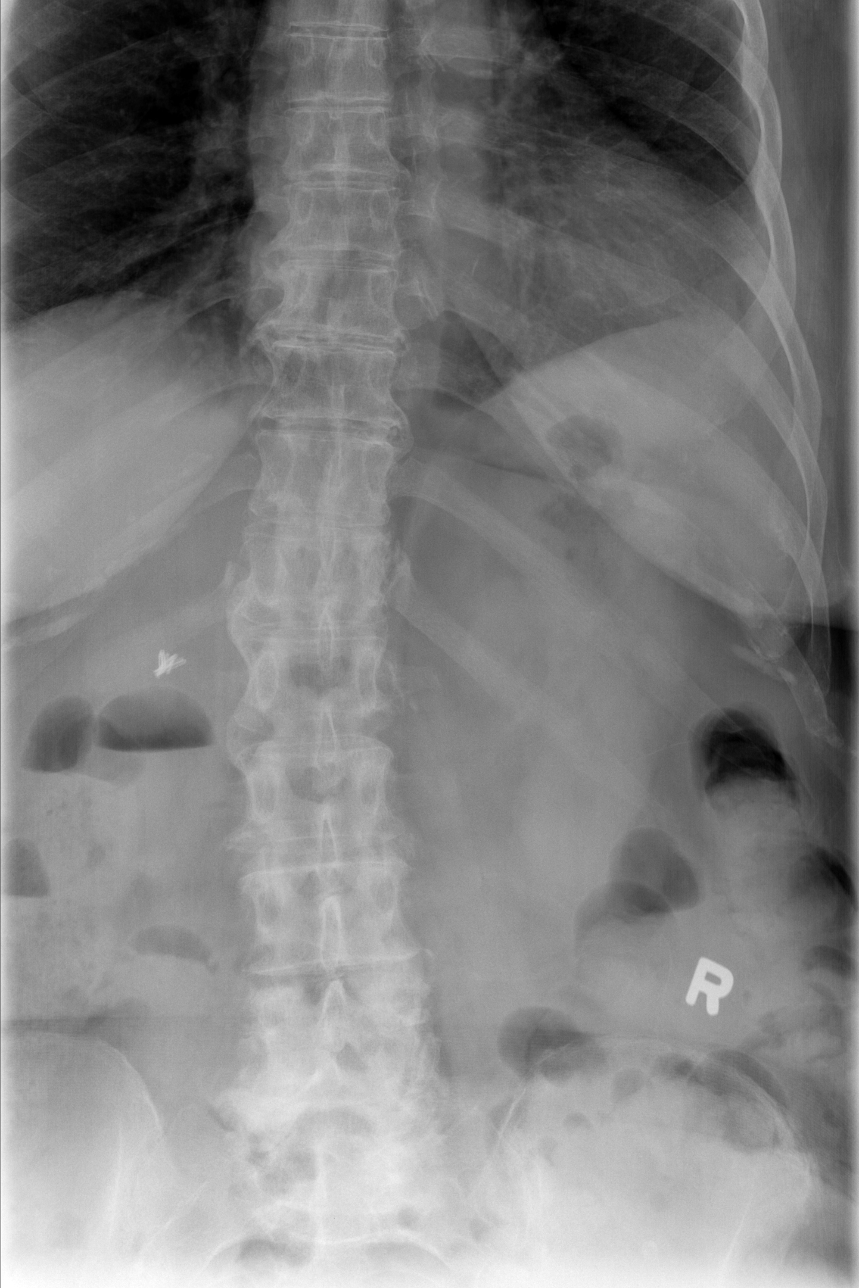

[w l-spine lat (1 of 3)]
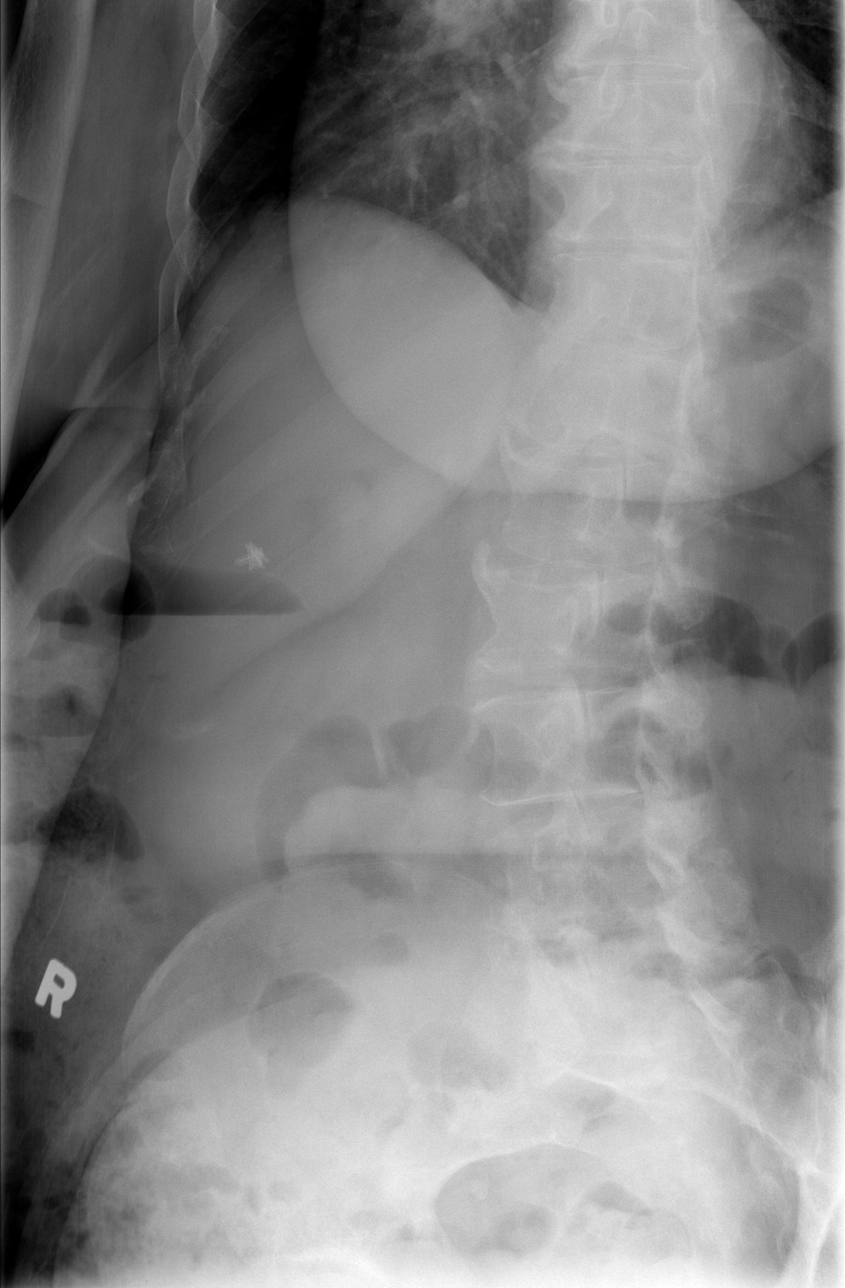

[w l-spine a.p. (2 of 2)]
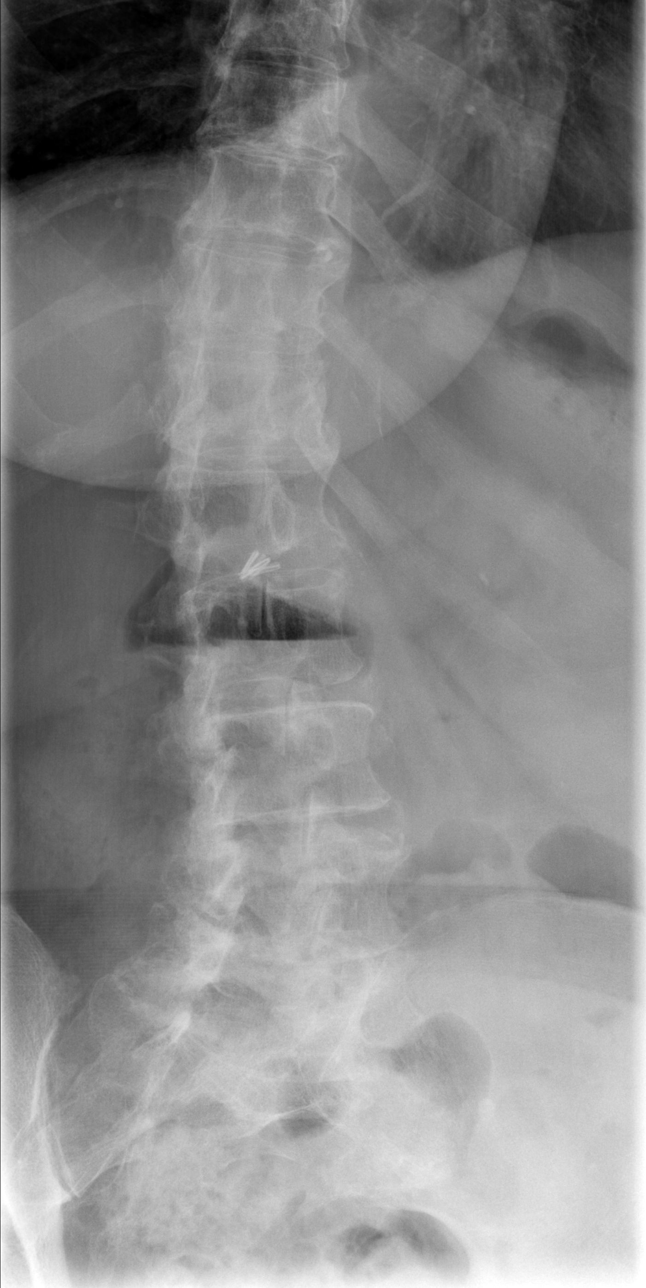

[w l-spine lat (2 of 3)]
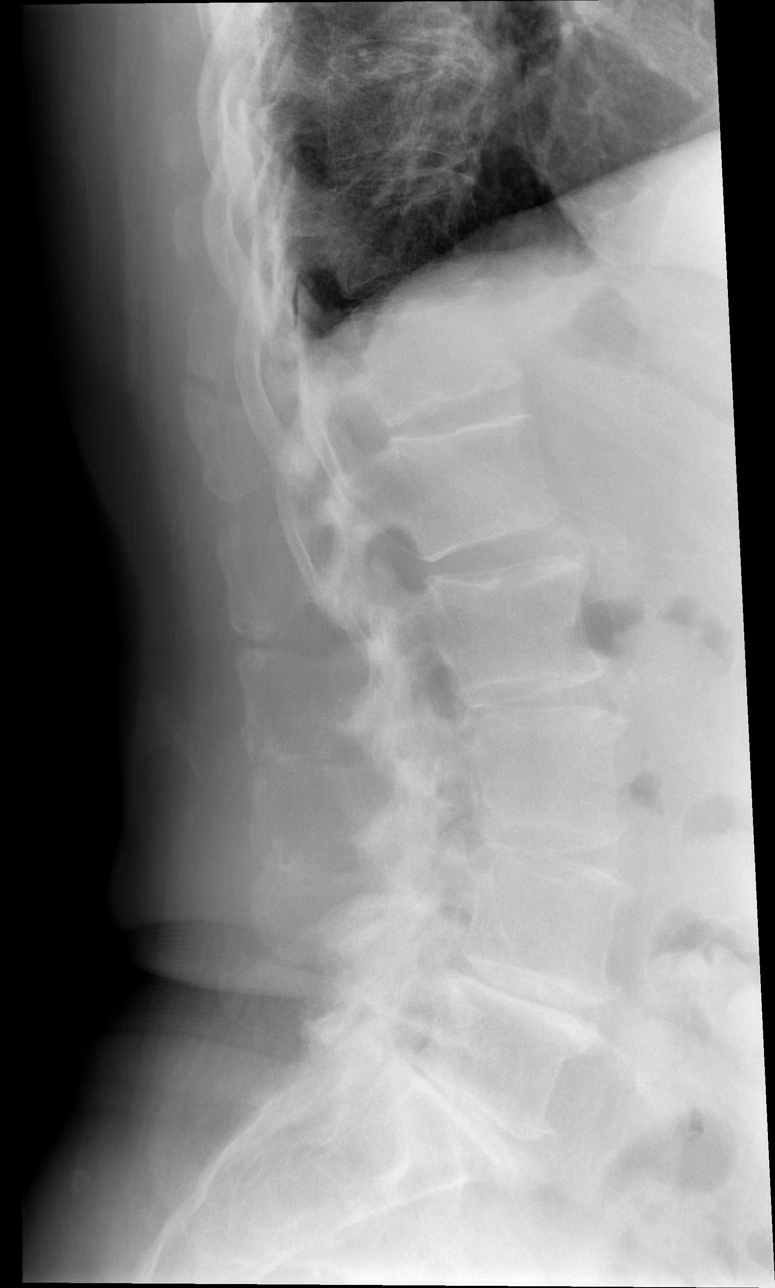

[w l-spine lat (3 of 3)]
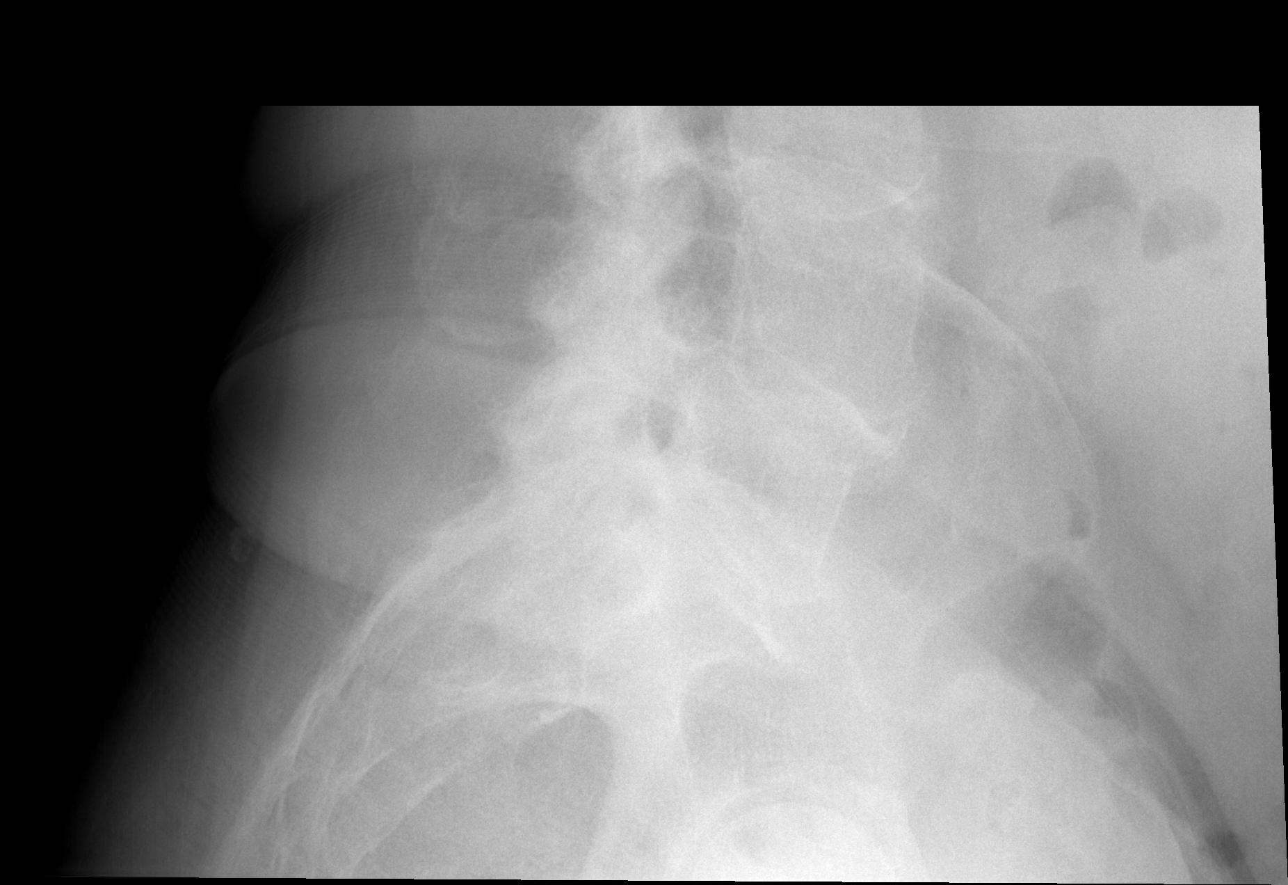

[5 of 5 positions shown; findings below may reference images not displayed]

FINDINGS: There are 5 nonrib bearing lumbar-type vertebral bodies. There is
mild anterior vertebral body height loss at T12 which is new
compared with 09/26/2012, but technically of indeterminate age. The
remainder of the vertebral body heights are maintained. The
alignment is anatomic. There is no spondylolysis. There is no acute
fracture or static listhesis. There is degenerative disc disease
most severe at L4-5 and L5-S1. There is bilateral facet arthropathy
at L3-4, L4-5 and L5-S1.

The SI joints are unremarkable.
IMPRESSION: 1. Age-indeterminate mild anterior vertebral body height loss at
T12. If there is clinical concern regarding the acuity of this
injury, further evaluation with MRI or bone scan is recommended.

## 2015-06-10 ENCOUNTER — Encounter (INDEPENDENT_AMBULATORY_CARE_PROVIDER_SITE_OTHER): Payer: Medicare Other | Admitting: Podiatry

## 2015-06-10 DIAGNOSIS — B351 Tinea unguium: Secondary | ICD-10-CM | POA: Diagnosis not present

## 2015-06-10 DIAGNOSIS — G5761 Lesion of plantar nerve, right lower limb: Secondary | ICD-10-CM

## 2015-06-10 DIAGNOSIS — M79676 Pain in unspecified toe(s): Secondary | ICD-10-CM

## 2015-06-16 IMAGING — CR DG CHEST 2V
2 series · 2 of 2 positions shown · non-contrast
Comparison: DG CHEST 2 VIEW dated 07/13/2012

CLINICAL DATA: Dyspnea, cough, left upper lobe crackles

EXAM:
CHEST  2 VIEW

[w chest pa]
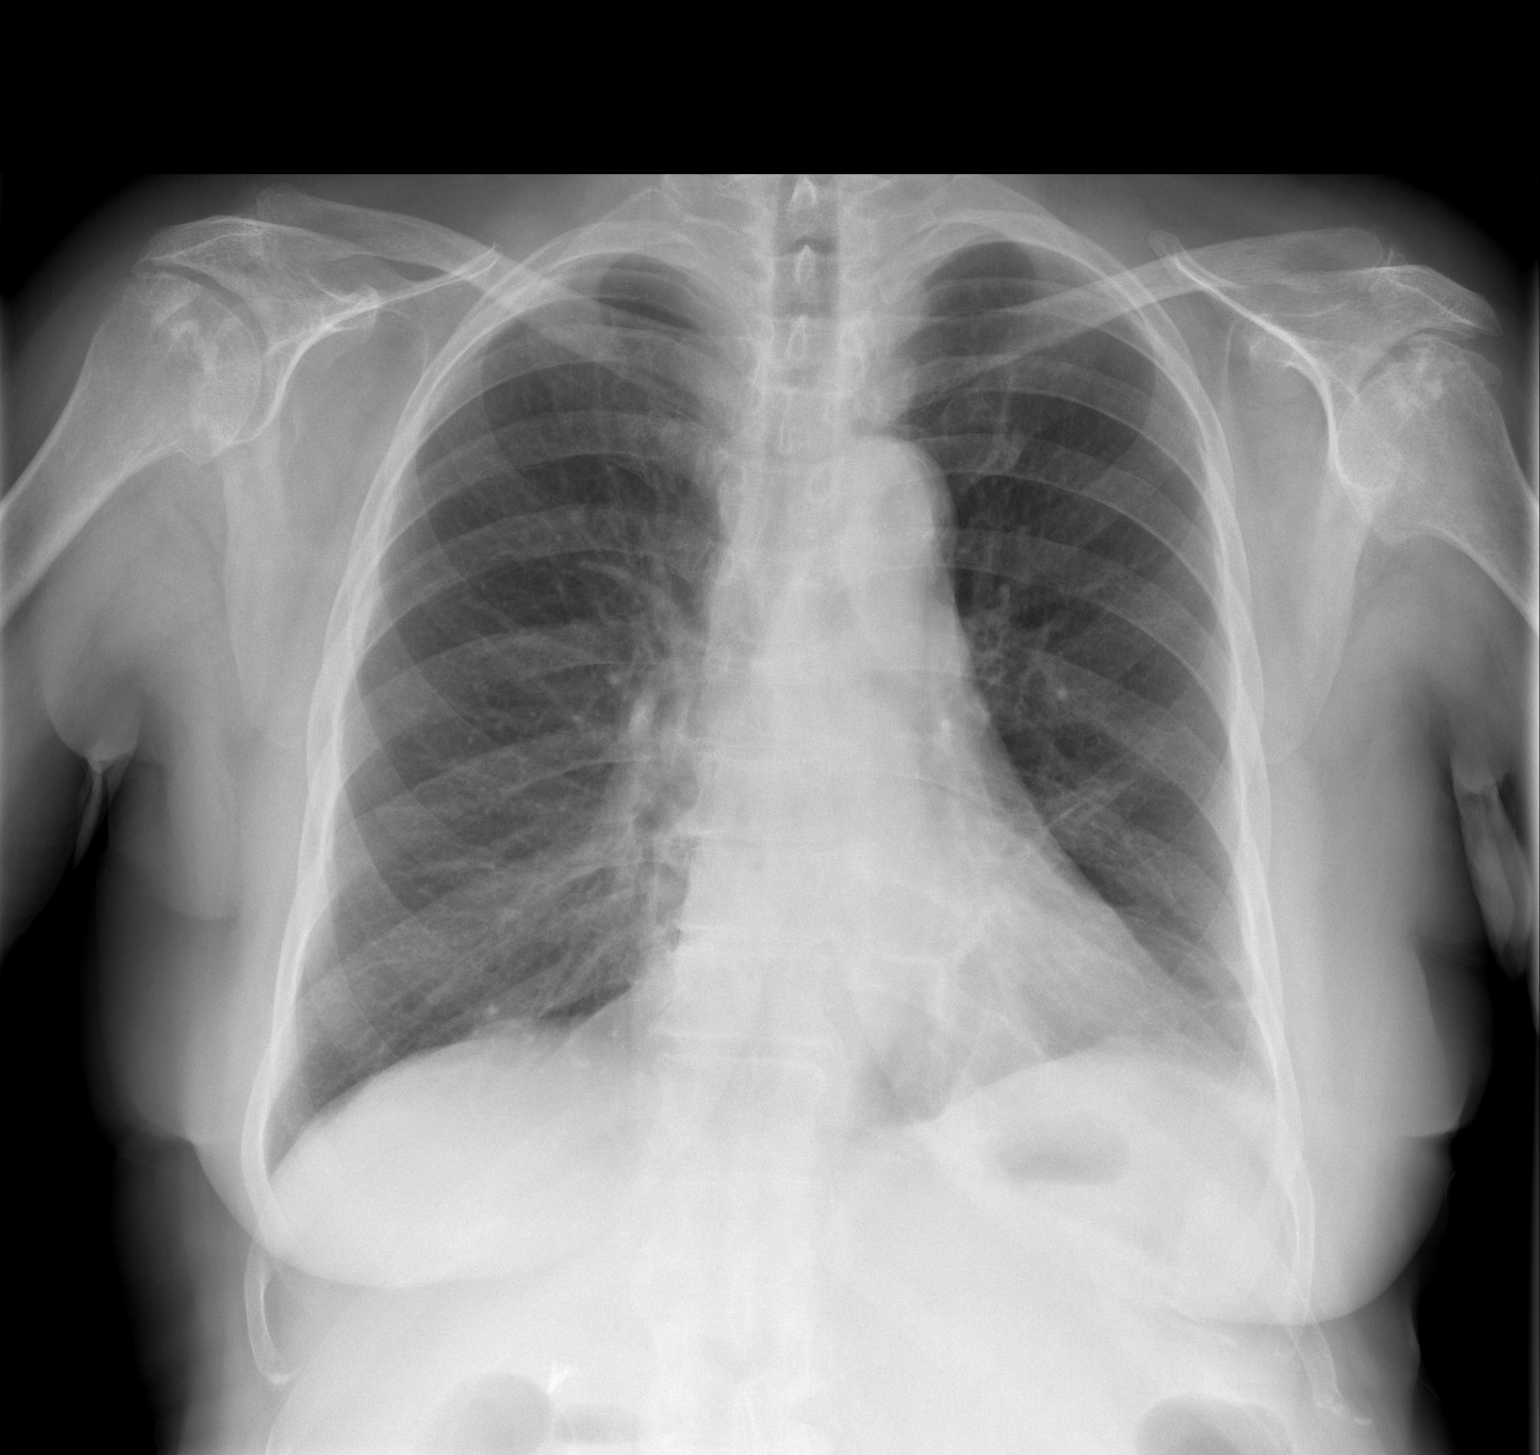

[w chest lat]
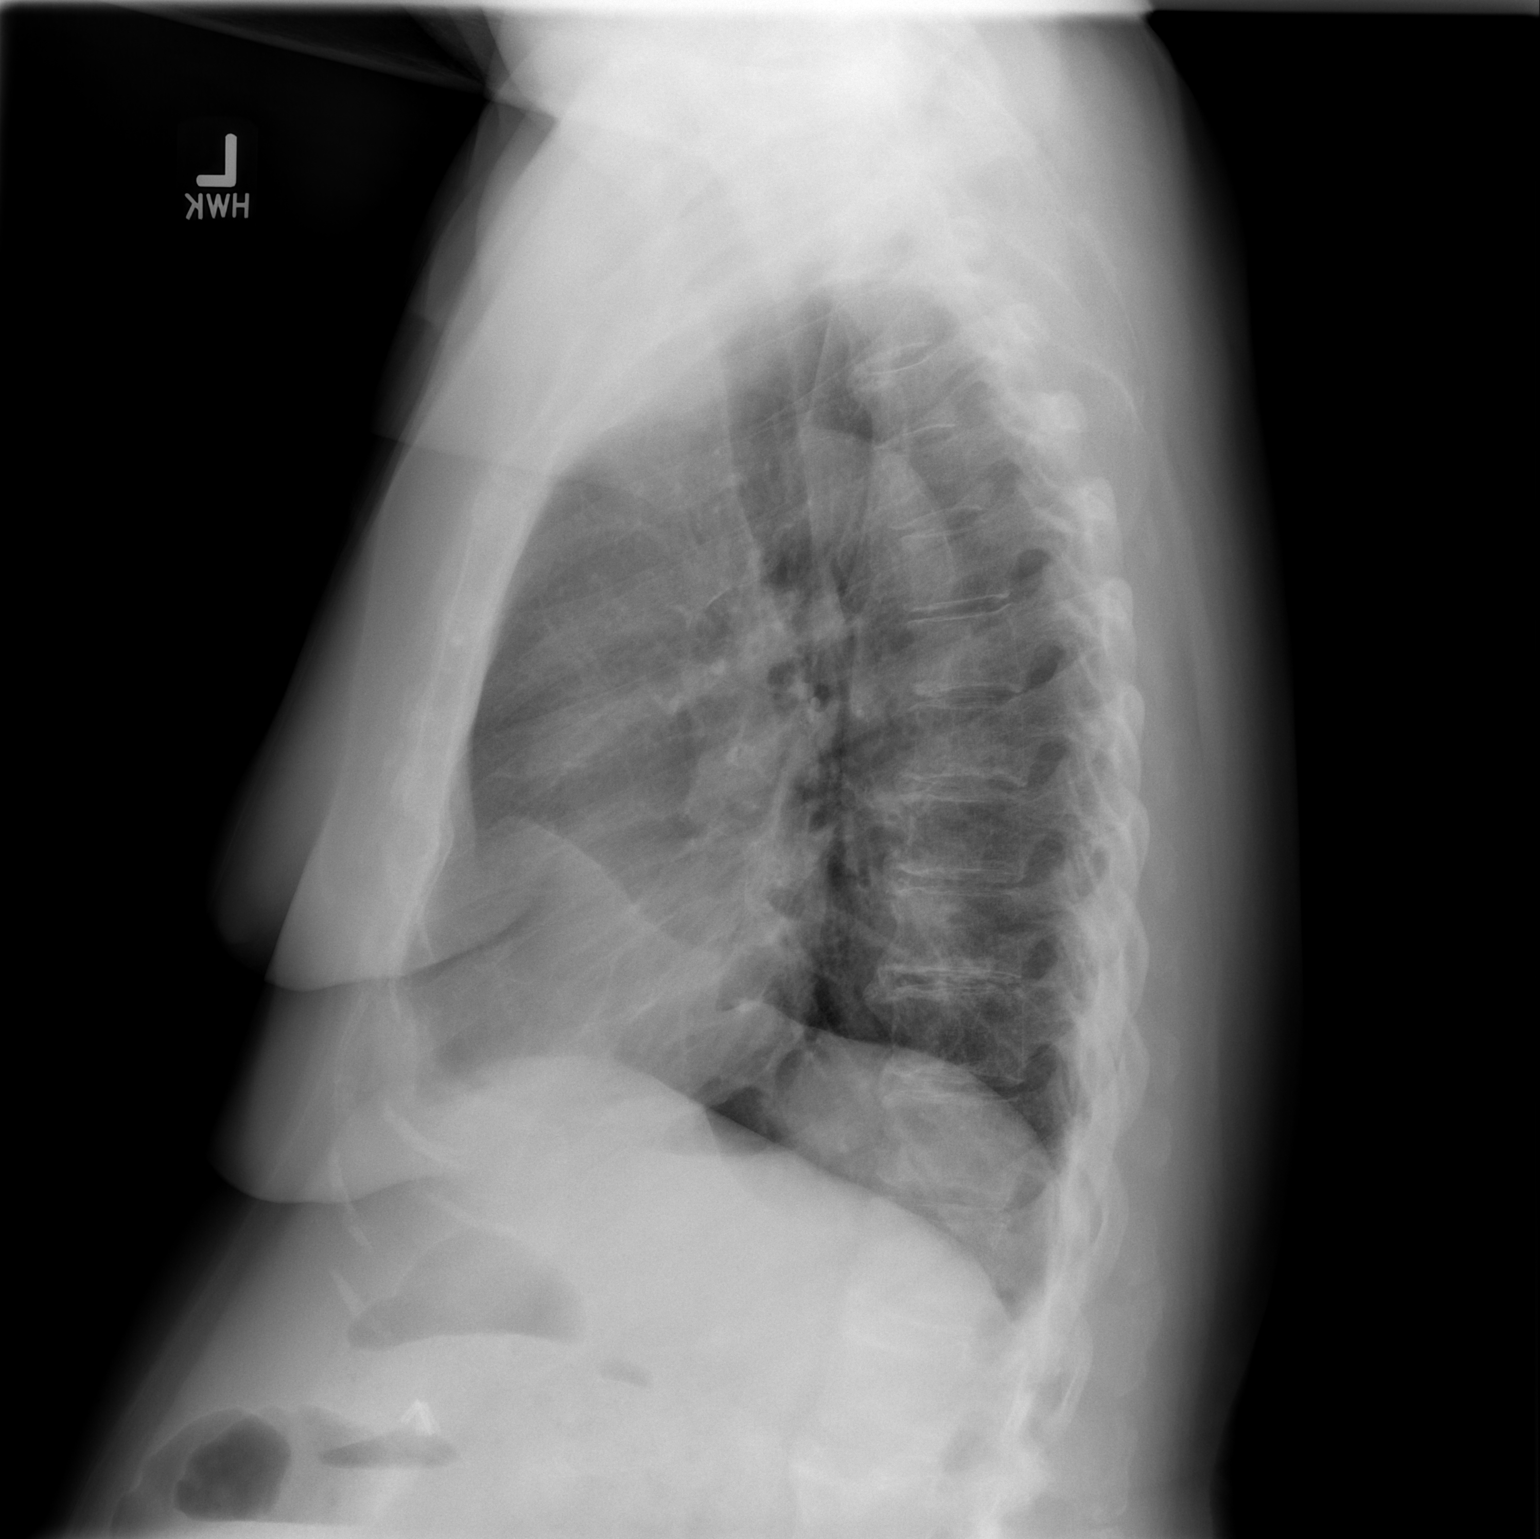

[2 of 2 positions shown; findings below may reference images not displayed]

FINDINGS: The heart size and mediastinal contours are within normal limits.
Both lungs are clear. The visualized skeletal structures are
unremarkable. Areas of discoid atelectasis projecting region of the
lingula.
IMPRESSION: No active cardiopulmonary disease.

## 2015-06-17 DIAGNOSIS — Z79899 Other long term (current) drug therapy: Secondary | ICD-10-CM | POA: Diagnosis not present

## 2015-06-27 IMAGING — MR MR LUMBAR SPINE W/O CM
4 of 11 series · 19 of 48 positions shown · non-contrast
Comparison: None.

CLINICAL DATA: Severe low back and buttock pain.

EXAM:
MRI LUMBAR SPINE WITHOUT CONTRAST
TECHNIQUE: Multiplanar, multisequence MR imaging was performed. No intravenous
contrast was administered.

[Series 4: T2 · sagittal · 3.0mm · 0.62mm/px · 2 of 13 slices shown (1 of 4)]
[im 1/13]
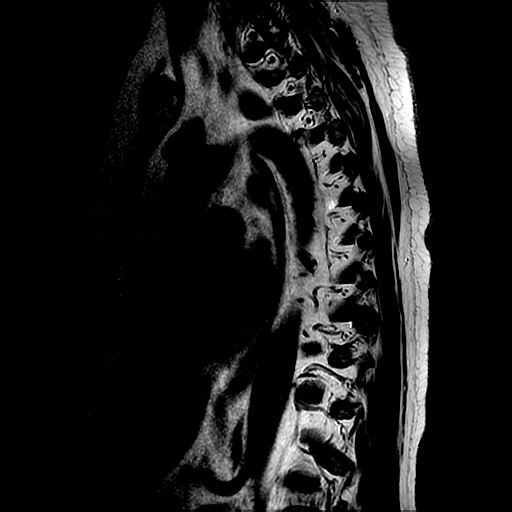
[im 13/13]
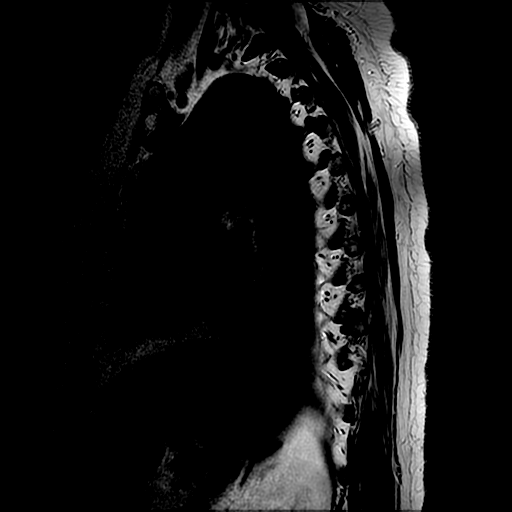

[Series 7: T2 · axial · 4.0mm · 0.43mm/px · z∈[-231,-23]mm · 8 of 36 slices shown (2 of 4)]
[im 1/36]
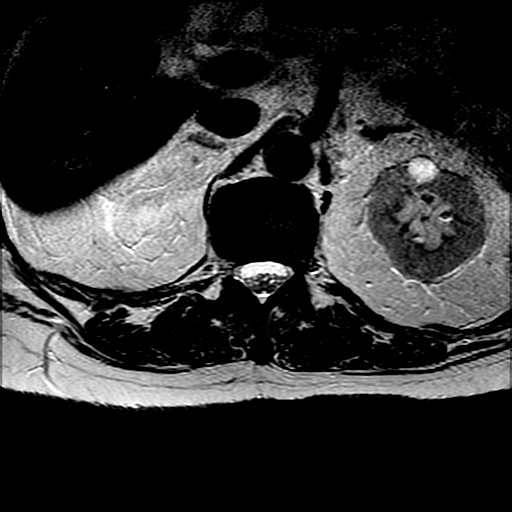
[im 6/36]
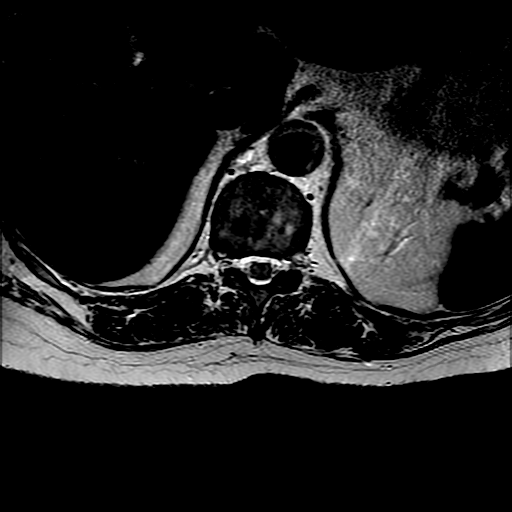
[im 11/36]
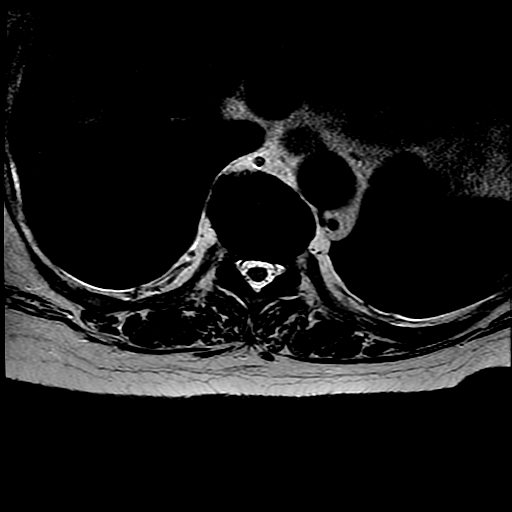
[im 16/36]
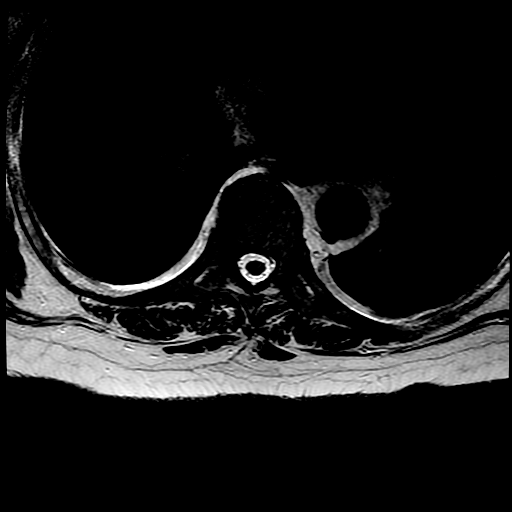
[im 21/36]
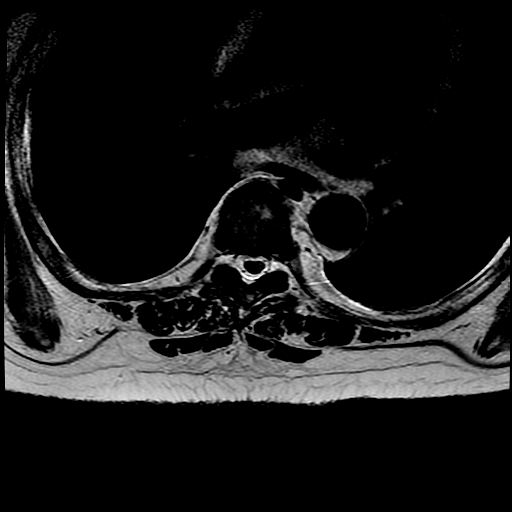
[im 26/36]
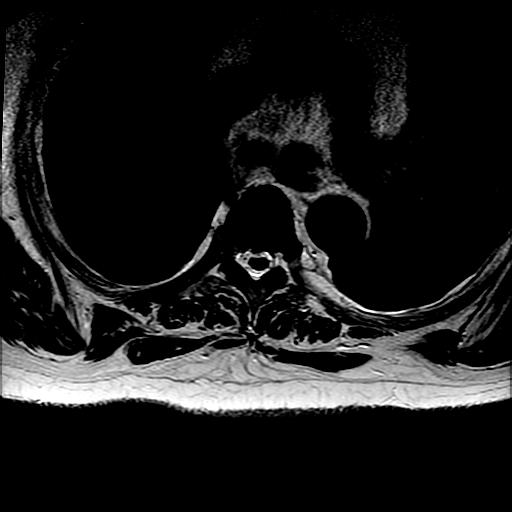
[im 31/36]
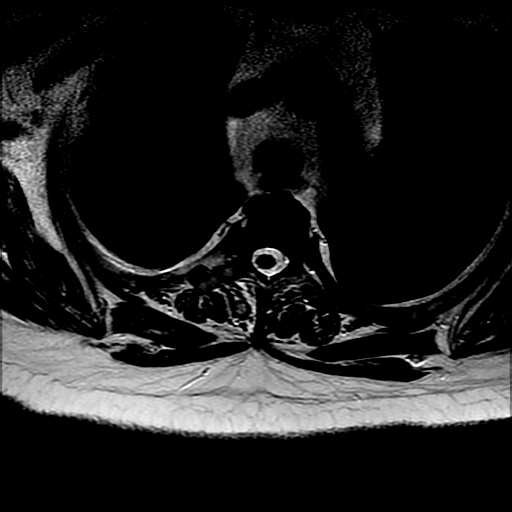
[im 36/36]
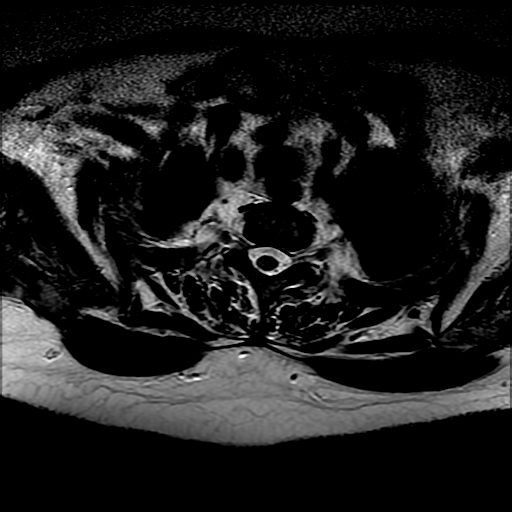

[Series 11: T2 · sagittal · 4.0mm · 0.55mm/px · 3 of 13 slices shown (3 of 4)]
[im 1/13]
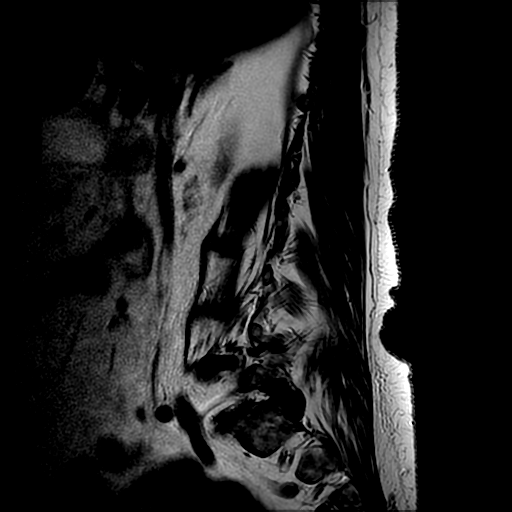
[im 7/13]
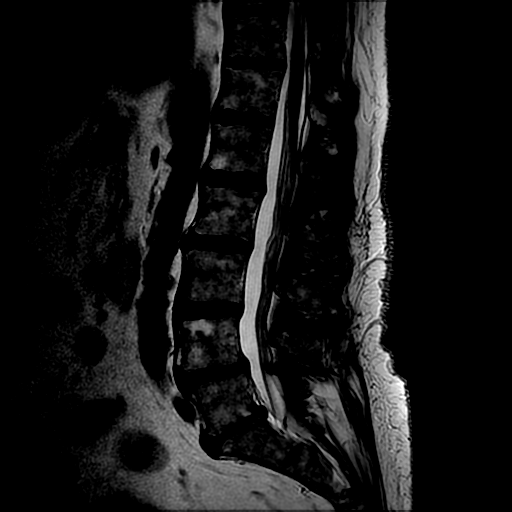
[im 13/13]
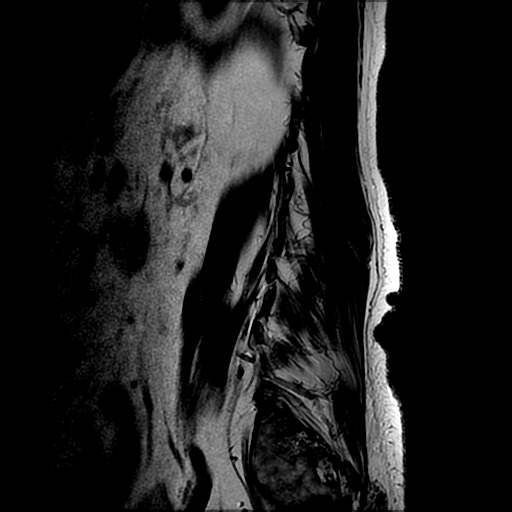

[Series 13: T2 · axial · 4.0mm · 0.39mm/px · z∈[-452,-295]mm · 6 of 32 slices shown (4 of 4)]
[im 1/32]
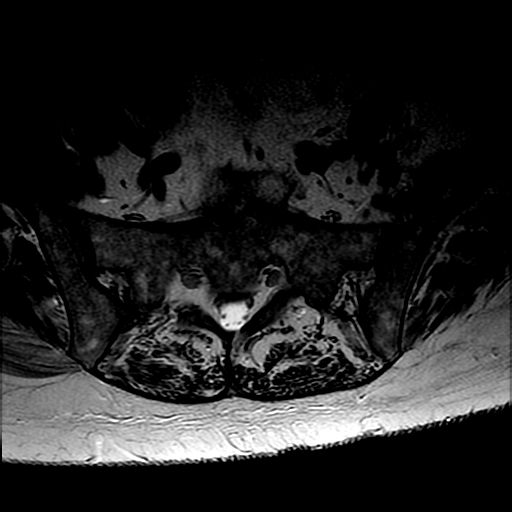
[im 6/32]
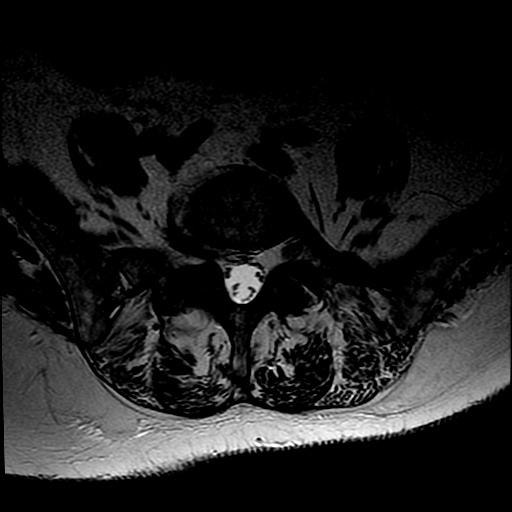
[im 11/32]
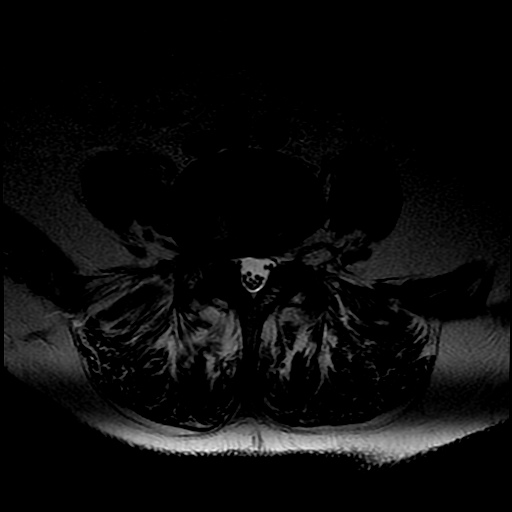
[im 16/32]
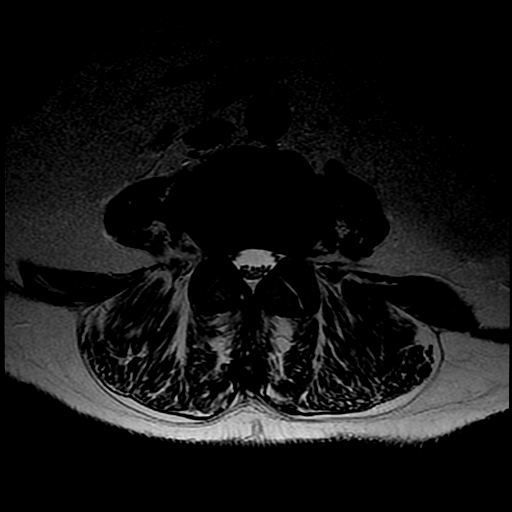
[im 21/32]
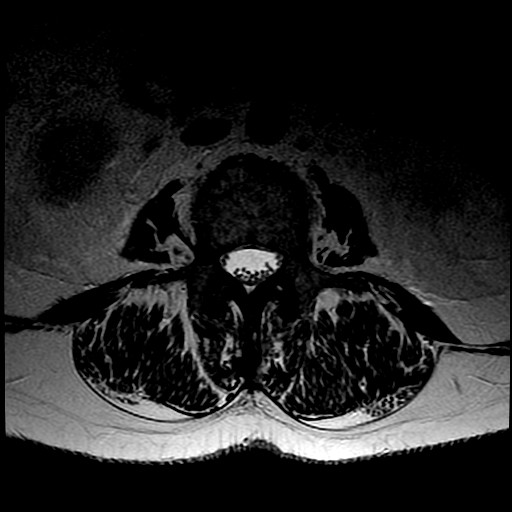
[im 26/32]
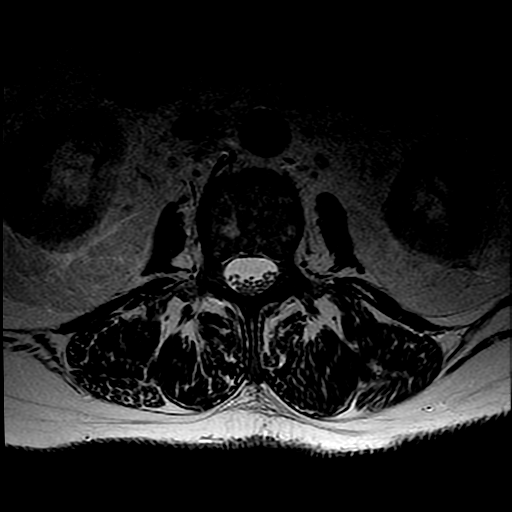

[19 of 48 positions shown; findings below may reference images not displayed]

FINDINGS: Marrow signal is heterogeneous but no worrisome marrow lesion is
identified. Vertebral body height is maintained. Schmorl's nodes in
the lower thoracic spine are incidentally noted. There is convex
left thoracic scoliosis. The thoracic cord demonstrates normal
signal throughout. Imaged paraspinous structures show trace
bilateral pleural effusions. T2 hyperintense lesion in the left
kidney is incompletely visualized but likely represents a small
cyst.

T1-2: Negative.

T2-3:  Negative.

T3-4: Negative.

T4-5: Tiny central protrusion without central canal or foraminal
narrowing.

T5-6: Very shallow left paracentral protrusion without central canal
or foraminal narrowing.

T6-7: Shallow central protrusion without central canal or foraminal
narrowing.

T7-8: Minimal disc bulge to the left without central canal or
foraminal narrowing.

T8-9:  Negative.

T9-10:  Negative.

T10-11:  Negative.

T11-12: Minimal disc bulge without central canal or foraminal
narrowing.

T12-L1:  Negative.
IMPRESSION: No acute finding. Convex left scoliosis and mild multilevel
degenerative disc disease without central canal or foraminal
stenosis.

## 2015-07-04 DIAGNOSIS — I10 Essential (primary) hypertension: Secondary | ICD-10-CM | POA: Diagnosis not present

## 2015-07-04 DIAGNOSIS — E119 Type 2 diabetes mellitus without complications: Secondary | ICD-10-CM | POA: Diagnosis not present

## 2015-07-04 DIAGNOSIS — M353 Polymyalgia rheumatica: Secondary | ICD-10-CM | POA: Diagnosis not present

## 2015-07-04 DIAGNOSIS — Z6828 Body mass index (BMI) 28.0-28.9, adult: Secondary | ICD-10-CM | POA: Diagnosis not present

## 2015-07-04 DIAGNOSIS — D692 Other nonthrombocytopenic purpura: Secondary | ICD-10-CM | POA: Diagnosis not present

## 2015-07-04 DIAGNOSIS — I493 Ventricular premature depolarization: Secondary | ICD-10-CM | POA: Diagnosis not present

## 2015-07-08 ENCOUNTER — Telehealth: Payer: Self-pay | Admitting: Internal Medicine

## 2015-07-08 NOTE — Telephone Encounter (Signed)
Ms. Prechtel is calling because her mom has been having some chest discomfort and have been taking the nitro . Please call   Thanks

## 2015-07-08 NOTE — Telephone Encounter (Signed)
Spoke with pt, she had an episode this morning of pain in her chest, throat and tonsils that woke her from her sleep. She did feel some better after sitting up, the pain was eased with one Ntg. She has been taking more NTG the usual, 2 to 3 times a week. She also c/o her hear fluttering at times. Follow up scheduled with dr Debara Pickett. Patient voiced understanding to go to the ER for any unrelieved pain.

## 2015-07-08 NOTE — Telephone Encounter (Signed)
Spoke with pt dtr, she wanted dr Debara Pickett to know the pt has been using NTG more. Per the dtr, the pt woke at 4 am this morning with pain and took 1 NTG and it eased. Pt is currently at [facility] until 2 pm. Ask for the pt to call back once she gets home.

## 2015-07-11 ENCOUNTER — Ambulatory Visit: Payer: Medicare Other | Admitting: Internal Medicine

## 2015-07-15 DIAGNOSIS — M353 Polymyalgia rheumatica: Secondary | ICD-10-CM | POA: Diagnosis not present

## 2015-07-15 DIAGNOSIS — Z7952 Long term (current) use of systemic steroids: Secondary | ICD-10-CM | POA: Diagnosis not present

## 2015-07-15 DIAGNOSIS — N183 Chronic kidney disease, stage 3 (moderate): Secondary | ICD-10-CM | POA: Diagnosis not present

## 2015-07-15 DIAGNOSIS — D472 Monoclonal gammopathy: Secondary | ICD-10-CM | POA: Diagnosis not present

## 2015-07-15 DIAGNOSIS — M150 Primary generalized (osteo)arthritis: Secondary | ICD-10-CM | POA: Diagnosis not present

## 2015-07-15 DIAGNOSIS — M810 Age-related osteoporosis without current pathological fracture: Secondary | ICD-10-CM | POA: Diagnosis not present

## 2015-07-16 ENCOUNTER — Telehealth: Payer: Self-pay | Admitting: Internal Medicine

## 2015-07-16 NOTE — Telephone Encounter (Signed)
Patient calling the office for samples of medication:   1.  What medication and dosage are you requesting samples for?Cardizem or the generic  2.  Are you currently out of this medication?yes-they are trying to get this medicine worked out with the pharmacist and primary care doctor

## 2015-07-16 NOTE — Telephone Encounter (Signed)
Returned call to patient's daughter [name].She stated prior authorization in process for mother's diltiazem.She wanted to know if we have any samples.Advised diltiazem is generic and we don't receive generic samples.

## 2015-07-17 DIAGNOSIS — Z79899 Other long term (current) drug therapy: Secondary | ICD-10-CM | POA: Diagnosis not present

## 2015-07-17 DIAGNOSIS — M353 Polymyalgia rheumatica: Secondary | ICD-10-CM | POA: Diagnosis not present

## 2015-07-23 ENCOUNTER — Ambulatory Visit: Payer: Medicare Other | Admitting: Physician Assistant

## 2015-07-25 NOTE — Progress Notes (Signed)
This encounter was created in error - please disregard.

## 2015-08-06 ENCOUNTER — Other Ambulatory Visit (HOSPITAL_COMMUNITY): Payer: Self-pay | Admitting: Internal Medicine

## 2015-08-06 ENCOUNTER — Ambulatory Visit (HOSPITAL_COMMUNITY)
Admission: RE | Admit: 2015-08-06 | Discharge: 2015-08-06 | Disposition: A | Discharge status: Home / Self Care | Payer: Medicare Other | Source: Ambulatory Visit | Attending: Internal Medicine | Admitting: Internal Medicine

## 2015-08-06 DIAGNOSIS — M810 Age-related osteoporosis without current pathological fracture: Secondary | ICD-10-CM | POA: Diagnosis not present

## 2015-08-06 DIAGNOSIS — M7989 Other specified soft tissue disorders: Secondary | ICD-10-CM | POA: Insufficient documentation

## 2015-08-06 DIAGNOSIS — N183 Chronic kidney disease, stage 3 (moderate): Secondary | ICD-10-CM | POA: Diagnosis not present

## 2015-08-06 DIAGNOSIS — Z7952 Long term (current) use of systemic steroids: Secondary | ICD-10-CM | POA: Diagnosis not present

## 2015-08-06 DIAGNOSIS — M79605 Pain in left leg: Secondary | ICD-10-CM

## 2015-08-06 DIAGNOSIS — M353 Polymyalgia rheumatica: Secondary | ICD-10-CM | POA: Diagnosis not present

## 2015-08-06 DIAGNOSIS — D472 Monoclonal gammopathy: Secondary | ICD-10-CM | POA: Diagnosis not present

## 2015-08-06 DIAGNOSIS — M150 Primary generalized (osteo)arthritis: Secondary | ICD-10-CM | POA: Diagnosis not present

## 2015-08-06 NOTE — Progress Notes (Signed)
*  PRELIMINARY RESULTS* Vascular Ultrasound Left lower extremity venous duplex has been completed.  Preliminary findings: No evidence of DVT or baker's cyst.   Attempted call report to office number. Office is closed and could not leave message. No other number was given for urgent results. Since patient is negative, will let patient leave.    Landry Mellow, RDMS, RVT  08/06/2015, 5:21 PM

## 2015-08-10 ENCOUNTER — Encounter (HOSPITAL_COMMUNITY): Payer: Self-pay | Admitting: Emergency Medicine

## 2015-08-10 ENCOUNTER — Emergency Department (HOSPITAL_COMMUNITY): Payer: Medicare Other

## 2015-08-10 ENCOUNTER — Observation Stay (HOSPITAL_COMMUNITY)
Admission: EM | Admit: 2015-08-10 | Discharge: 2015-08-14 | Disposition: A | Discharge status: Home / Self Care | Payer: Medicare Other | Attending: Internal Medicine | Admitting: Internal Medicine

## 2015-08-10 DIAGNOSIS — E039 Hypothyroidism, unspecified: Secondary | ICD-10-CM | POA: Diagnosis not present

## 2015-08-10 DIAGNOSIS — Z8673 Personal history of transient ischemic attack (TIA), and cerebral infarction without residual deficits: Secondary | ICD-10-CM | POA: Insufficient documentation

## 2015-08-10 DIAGNOSIS — R413 Other amnesia: Secondary | ICD-10-CM | POA: Diagnosis not present

## 2015-08-10 DIAGNOSIS — R079 Chest pain, unspecified: Secondary | ICD-10-CM

## 2015-08-10 DIAGNOSIS — Z79899 Other long term (current) drug therapy: Secondary | ICD-10-CM | POA: Diagnosis not present

## 2015-08-10 DIAGNOSIS — N184 Chronic kidney disease, stage 4 (severe): Secondary | ICD-10-CM | POA: Diagnosis not present

## 2015-08-10 DIAGNOSIS — E1122 Type 2 diabetes mellitus with diabetic chronic kidney disease: Secondary | ICD-10-CM | POA: Diagnosis not present

## 2015-08-10 DIAGNOSIS — Z7952 Long term (current) use of systemic steroids: Secondary | ICD-10-CM | POA: Insufficient documentation

## 2015-08-10 DIAGNOSIS — F259 Schizoaffective disorder, unspecified: Secondary | ICD-10-CM | POA: Insufficient documentation

## 2015-08-10 DIAGNOSIS — Z7902 Long term (current) use of antithrombotics/antiplatelets: Secondary | ICD-10-CM | POA: Insufficient documentation

## 2015-08-10 DIAGNOSIS — I25110 Atherosclerotic heart disease of native coronary artery with unstable angina pectoris: Secondary | ICD-10-CM | POA: Diagnosis not present

## 2015-08-10 DIAGNOSIS — D472 Monoclonal gammopathy: Secondary | ICD-10-CM | POA: Insufficient documentation

## 2015-08-10 DIAGNOSIS — M25551 Pain in right hip: Secondary | ICD-10-CM | POA: Diagnosis not present

## 2015-08-10 DIAGNOSIS — M353 Polymyalgia rheumatica: Secondary | ICD-10-CM | POA: Diagnosis present

## 2015-08-10 DIAGNOSIS — R52 Pain, unspecified: Secondary | ICD-10-CM | POA: Diagnosis not present

## 2015-08-10 DIAGNOSIS — I5032 Chronic diastolic (congestive) heart failure: Secondary | ICD-10-CM | POA: Diagnosis not present

## 2015-08-10 DIAGNOSIS — Z87891 Personal history of nicotine dependence: Secondary | ICD-10-CM | POA: Diagnosis not present

## 2015-08-10 DIAGNOSIS — D631 Anemia in chronic kidney disease: Secondary | ICD-10-CM | POA: Diagnosis present

## 2015-08-10 DIAGNOSIS — N189 Chronic kidney disease, unspecified: Secondary | ICD-10-CM

## 2015-08-10 DIAGNOSIS — Z955 Presence of coronary angioplasty implant and graft: Secondary | ICD-10-CM | POA: Diagnosis not present

## 2015-08-10 DIAGNOSIS — J449 Chronic obstructive pulmonary disease, unspecified: Secondary | ICD-10-CM | POA: Insufficient documentation

## 2015-08-10 LAB — DIFFERENTIAL
Basophils Absolute: 0 10*3/uL (ref 0.0–0.1)
Basophils Relative: 0 %
EOS ABS: 0.1 10*3/uL (ref 0.0–0.7)
EOS PCT: 1 %
LYMPHS ABS: 1.8 10*3/uL (ref 0.7–4.0)
LYMPHS PCT: 7 %
MONO ABS: 1.3 10*3/uL — AB (ref 0.1–1.0)
Monocytes Relative: 5 %
NEUTROS PCT: 87 %
Neutro Abs: 21.3 10*3/uL — ABNORMAL HIGH (ref 1.7–7.7)

## 2015-08-10 LAB — CBC
HCT: 31.8 % — ABNORMAL LOW (ref 36.0–46.0)
HEMOGLOBIN: 10.4 g/dL — AB (ref 12.0–15.0)
MCH: 31.3 pg (ref 26.0–34.0)
MCHC: 32.7 g/dL (ref 30.0–36.0)
MCV: 95.8 fL (ref 78.0–100.0)
Platelets: 223 10*3/uL (ref 150–400)
RBC: 3.32 MIL/uL — AB (ref 3.87–5.11)
RDW: 13.5 % (ref 11.5–15.5)
WBC: 22.8 10*3/uL — AB (ref 4.0–10.5)

## 2015-08-10 LAB — BRAIN NATRIURETIC PEPTIDE: B Natriuretic Peptide: 91 pg/mL (ref 0.0–100.0)

## 2015-08-10 LAB — BASIC METABOLIC PANEL
ANION GAP: 9 (ref 5–15)
BUN: 52 mg/dL — ABNORMAL HIGH (ref 6–20)
CALCIUM: 9.5 mg/dL (ref 8.9–10.3)
CO2: 23 mmol/L (ref 22–32)
Chloride: 106 mmol/L (ref 101–111)
Creatinine, Ser: 1.94 mg/dL — ABNORMAL HIGH (ref 0.44–1.00)
GFR calc non Af Amer: 24 mL/min — ABNORMAL LOW (ref >60)
GFR, EST AFRICAN AMERICAN: 28 mL/min — AB (ref >60)
Glucose, Bld: 133 mg/dL — ABNORMAL HIGH (ref 65–99)
Potassium: 4.8 mmol/L (ref 3.5–5.1)
SODIUM: 138 mmol/L (ref 135–145)

## 2015-08-10 LAB — I-STAT TROPONIN, ED: TROPONIN I, POC: 0 ng/mL (ref 0.00–0.08)

## 2015-08-10 MED ORDER — CLOPIDOGREL BISULFATE 75 MG PO TABS
75.0000 mg | ORAL_TABLET | Freq: Every day | ORAL | Status: DC
  Administered 2015-08-11 – 2015-08-14 (×4): 75 mg via ORAL
  Filled 2015-08-10 (×4): qty 1

## 2015-08-10 MED ORDER — DILTIAZEM HCL ER 180 MG PO CP24
180.0000 mg | ORAL_CAPSULE | Freq: Every day | ORAL | Status: DC
  Administered 2015-08-11 – 2015-08-14 (×4): 180 mg via ORAL
  Filled 2015-08-10 (×9): qty 1

## 2015-08-10 MED ORDER — LORATADINE 10 MG PO TABS
10.0000 mg | ORAL_TABLET | Freq: Every day | ORAL | Status: DC
  Administered 2015-08-11 – 2015-08-14 (×3): 10 mg via ORAL
  Filled 2015-08-10 (×4): qty 1

## 2015-08-10 MED ORDER — ALBUTEROL SULFATE HFA 108 (90 BASE) MCG/ACT IN AERS: 2.0000 | INHALATION_SPRAY | Freq: Four times a day (QID) | RESPIRATORY_TRACT | Status: DC | PRN

## 2015-08-10 MED ORDER — CLOZAPINE 100 MG PO TABS
300.0000 mg | ORAL_TABLET | Freq: Every day | ORAL | Status: DC
  Administered 2015-08-11 – 2015-08-13 (×4): 300 mg via ORAL
  Filled 2015-08-10 (×5): qty 3

## 2015-08-10 MED ORDER — DOCUSATE SODIUM 100 MG PO CAPS
200.0000 mg | ORAL_CAPSULE | Freq: Every day | ORAL | Status: DC
  Administered 2015-08-11 – 2015-08-13 (×4): 200 mg via ORAL
  Filled 2015-08-10 (×4): qty 2

## 2015-08-10 MED ORDER — TEMAZEPAM 15 MG PO CAPS: 15.0000 mg | ORAL_CAPSULE | Freq: Every evening | ORAL | Status: DC | PRN

## 2015-08-10 MED ORDER — PRAVASTATIN SODIUM 20 MG PO TABS
20.0000 mg | ORAL_TABLET | Freq: Every day | ORAL | Status: DC
  Administered 2015-08-11 – 2015-08-13 (×4): 20 mg via ORAL
  Filled 2015-08-10 (×4): qty 1

## 2015-08-10 MED ORDER — PANTOPRAZOLE SODIUM 40 MG PO TBEC
40.0000 mg | DELAYED_RELEASE_TABLET | Freq: Every day | ORAL | Status: DC
  Administered 2015-08-11 – 2015-08-14 (×3): 40 mg via ORAL
  Filled 2015-08-10 (×4): qty 1

## 2015-08-10 MED ORDER — FERROUS SULFATE 325 (65 FE) MG PO TABS
325.0000 mg | ORAL_TABLET | Freq: Two times a day (BID) | ORAL | Status: DC
  Administered 2015-08-11 – 2015-08-14 (×6): 325 mg via ORAL
  Filled 2015-08-10 (×7): qty 1

## 2015-08-10 NOTE — ED Notes (Signed)
Patient transported to X-ray 

## 2015-08-10 NOTE — ED Provider Notes (Signed)
Medical screening examination/treatment/procedure(s) were conducted as a shared visit with non-physician practitioner(s) and myself.  I personally evaluated the patient during the encounter.   ED ECG REPORT   Date: 08/10/2015  Rate: 68   Rhythm: normal sinus rhythm  QRS Axis: normal  Intervals: normal  ST/T Wave abnormalities: normal  Conduction Disutrbances:none  Narrative Interpretation:   Old EKG Reviewed: none available  I have personally reviewed the EKG tracing and agree with the computerized printout as noted.  Patient here from home after she expressed chest pain which was relieved after taking nitroglycerin 3. Currently chest pain-free. Troponin negative. EKG is normal. Will be admitted for further evaluation of her chest pain symptoms  Lacretia Leigh, MD 08/10/15 2311

## 2015-08-10 NOTE — ED Notes (Signed)
Brought in from home via EMS for generalized chest pain.  Per EMS pt reported pain initially started bil ribcage but moved to upper chest/neck then into left shoulder.  Now reporting no pain.  Given ASA 324 and Ntg SL X 1 via EMS.  Reported no initial relief but now reports no pain.

## 2015-08-10 NOTE — ED Provider Notes (Signed)
CSN: [id number]     Arrival date & time 08/10/15  1953 History   First MD Initiated Contact with Patient 08/10/15 2001     Chief Complaint  Patient presents with  . Chest Pain     (Consider location/radiation/quality/duration/timing/severity/associated sxs/prior Treatment) HPI Comments: Patient is a 75 year old female with a history of diabetes mellitus, obesity, COPD, CVA, CAD, CHF (LVEF 60-65%), and polymyalgia rheumatica. She presents to the emergency department for complaints of chest pain which she states began at 1700. She has no complaints of chest pain at this time. She is, instead, complaining of aching in her joints and her buttock. Patient does report that she received 3 sublingual nitroglycerin tablets prior to arrival. She was also given 324 mg of aspirin by EMS. She states the pain was located in her bilateral rib cage and moved to her left chest and breast. EMS reports that patient complained of radiation of the pain to her neck and left shoulder. Patient states that she was making beaded necklaces and bracelets at the time of symptom onset. She was slightly diaphoretic at this time. She denies any lightheadedness or syncope. No fevers.  PCP - Dr. Ardeth Perfect Primary Cardiologist - Dr. Debara Pickett Nephrologist - Dr. Moshe Cipro  Patient is a 75 y.o. female presenting with chest pain. The history is provided by the patient. No language interpreter was used.  Chest Pain Associated symptoms: diaphoresis   Associated symptoms: no fever, no shortness of breath, not vomiting and no weakness     Past Medical History  Diagnosis Date  . Diabetes mellitus   . Frozen shoulder     right  . Obesity   . Renal insufficiency   . COPD (chronic obstructive pulmonary disease) ([place])   . Shortness of breath   . Stroke ([place])   . CAD (coronary artery disease) 2006    Taxus stent of mid RCA. negative myoview 2014 and normal LV function  . Diastolic heart failure, NYHA class 1 ([place])   . Polymyalgia  rheumatica ([place])   . 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 Dysuria 01/06/2015   Past Surgical History  Procedure Laterality Date  . Partial hysterectomy  1995  . Abdominal hysterectomy    . Cholecystectomy    . Coronary angioplasty with stent placement  2006    Taxus DES to RCA, 50 % residual   Family History  Problem Relation Age of Onset  . Diabetes Mother   . Heart disease Mother   . Heart disease Father   . Arthritis Father   . Healthy Sister   . Heart disease Brother   . Cancer Brother     colon ca  . Osteoporosis Sister   . Heart disease Brother   . Diabetes Brother   . Heart disease Brother   . Diabetes Brother   . Diabetes Brother   . Healthy Brother   . Healthy Brother   . Healthy Brother   . Cancer Maternal Uncle     blood condition  . Cancer Daughter     breast ca  . Cancer Cousin     [facility]   Social History  Substance Use Topics  . Smoking status: Former Smoker -- 1.00 packs/day for 35 years    Quit date: 04/14/2004  . Smokeless tobacco: Never Used  . Alcohol Use: No   OB History    No data available      Review of Systems  Constitutional: Positive for diaphoresis. Negative for fever.  Respiratory:  Negative for shortness of breath.   Cardiovascular: Positive for chest pain.  Gastrointestinal: Negative for vomiting.  Neurological: Negative for weakness and light-headedness.  All other systems reviewed and are negative.   Allergies  Levaquin  Home Medications   Prior to Admission medications   Medication Sig Start Date End Date Taking? Authorizing Provider  acetaminophen (TYLENOL) 500 MG tablet Take 1,000 mg by mouth 2 (two) times daily. Take 2 tablets (1000 mg) by mouth daily with breakfast, take 2 tablets (1000 mg) at 2pm   Yes Historical Provider, MD  albuterol (PROAIR HFA) 108 (90 Base) MCG/ACT inhaler Inhale 2 puffs into the lungs every 6 (six) hours as needed for wheezing or shortness of breath.   Yes  Historical Provider, MD  alendronate (FOSAMAX) 70 MG tablet Take 1 tablet (70 mg total) by mouth every 7 (seven) days. Take with a full glass of water on an empty stomach. Patient taking differently: Take 70 mg by mouth every 7 (seven) days. Take with a full glass of water on an empty stomach. On Saturdays 06/29/13  Yes Kinnie Feil, MD  cetirizine (ZYRTEC) 10 MG tablet Take 10 mg by mouth at bedtime.    Yes Historical Provider, MD  clopidogrel (PLAVIX) 75 MG tablet Take 1 tablet (75 mg total) by mouth daily with breakfast. Patient taking differently: Take 75 mg by mouth at bedtime.  10/18/13  Yes Thurnell Lose, MD  clozapine (CLOZARIL) 200 MG tablet Take 300 mg by mouth at bedtime. ]   Yes Historical Provider, MD  Cranberry (SM CRANBERRY) 300 MG tablet Take 300 mg by mouth at bedtime.    Yes Historical Provider, MD  diltiazem (CARDIZEM LA) 180 MG 24 hr tablet Take 180 mg by mouth daily with breakfast. 07/16/15  Yes Historical Provider, MD  docusate sodium (COLACE) 100 MG capsule Take 200 mg by mouth at bedtime.  03/01/12  Yes Katherina Mires, MD  ferrous sulfate 325 (65 FE) MG EC tablet Take 325 mg by mouth 2 (two) times daily.     Yes Historical Provider, MD  Fluticasone-Salmeterol (ADVAIR) 250-50 MCG/DOSE AEPB Inhale 1 puff into the lungs at bedtime.   Yes Historical Provider, MD  isosorbide mononitrate (IMDUR) 60 MG 24 hr tablet Take 1 tablet (60 mg total) by mouth daily. Patient taking differently: Take 60 mg by mouth daily with breakfast.  03/14/13  Yes Isaiah Serge, NP  levothyroxine (SYNTHROID, LEVOTHROID) 75 MCG tablet Take 75 mcg by mouth daily before breakfast.   Yes Historical Provider, MD  metoprolol succinate (TOPROL-XL) 100 MG 24 hr tablet Take 100 mg by mouth daily with breakfast. Take with or immediately following a meal.   Yes Historical Provider, MD  Multiple Vitamin (MULTIVITAMIN WITH MINERALS) TABS Take 1 tablet by mouth at bedtime. [employer] [facility]   Yes Historical Provider, MD   nitroGLYCERIN (NITROSTAT) 0.4 MG SL tablet Place 1 tablet (0.4 mg total) under the tongue every 5 (five) minutes as needed. For chest pain Patient taking differently: Place 0.4 mg under the tongue every 5 (five) minutes as needed for chest pain.  01/07/15  Yes Pixie Casino, MD  Omega-3 Fatty Acids (FISH OIL PO) Take 1 capsule by mouth at bedtime.   Yes Historical Provider, MD  pantoprazole (PROTONIX) 40 MG tablet Take 1 tablet (40 mg total) by mouth daily. Patient taking differently: Take 40 mg by mouth daily before breakfast.  04/03/13  Yes Pixie Casino, MD  Polyethyl Glycol-Propyl Glycol (SYSTANE OP)  Place 1 drop into both eyes 2 (two) times daily as needed (dry eyes).    Yes Historical Provider, MD  polyethylene glycol (MIRALAX / GLYCOLAX) packet Take 17 g by mouth daily at 2 PM. Mix in 8 oz liquid and drink   Yes Historical Provider, MD  pravastatin (PRAVACHOL) 20 MG tablet Take 20 mg by mouth at bedtime.   Yes Historical Provider, MD  predniSONE (DELTASONE) 1 MG tablet Take 4 mg by mouth daily with breakfast. Take with #2 5 mg tablets for a 14 mg dose 07/16/15  Yes Historical Provider, MD  predniSONE (DELTASONE) 5 MG tablet Take 10 mg by mouth daily with breakfast. Take with #4 1 mg tablets for a 14 mg dose 07/23/15  Yes Historical Provider, MD  temazepam (RESTORIL) 15 MG capsule Take 15 mg by mouth at bedtime as needed for sleep.    Yes Historical Provider, MD  Tiotropium Bromide Monohydrate (SPIRIVA RESPIMAT) 2.5 MCG/ACT AERS Inhale 2 puffs into the lungs daily.   Yes Historical Provider, MD  traMADol (ULTRAM) 50 MG tablet Take 50-100 mg by mouth See admin instructions. Take 2 tablets (100 mg) by mouth daily at 2pm, may also take 1-2 tablets as needed for pain   Yes Historical Provider, MD  traMADol (ULTRAM-ER) 300 MG 24 hr tablet Take 300 mg by mouth daily with breakfast.    Yes Historical Provider, MD  Vitamin D, Ergocalciferol, (DRISDOL) 50000 UNITS CAPS capsule Take 1 capsule (50,000  Units total) by mouth every 7 (seven) days. Patient taking differently: Take 50,000 Units by mouth every 7 (seven) days. Fridays 08/27/13  Yes Kinnie Feil, MD   BP 148/64 mmHg  Pulse 66  Temp(Src) 98 F (36.7 C) (Oral)  Resp 18  SpO2 99%   Physical Exam  Constitutional: She is oriented to person, place, and time. She appears well-developed and well-nourished. No distress.  Nontoxic appearing, pleasant  HENT:  Head: Normocephalic and atraumatic.  Eyes: Conjunctivae and EOM are normal. No scleral icterus.  Neck: Normal range of motion.  Cardiovascular: Normal rate, regular rhythm and intact distal pulses.   Pulmonary/Chest: Effort normal and breath sounds normal. No respiratory distress. She has no wheezes. She has no rales.  Lungs grossly clear. Prolonged expiratory phase.  Musculoskeletal: Normal range of motion.  Neurological: She is alert and oriented to person, place, and time. She exhibits normal muscle tone. Coordination normal.  Patient moving all extremities. GCS 15.  Skin: Skin is warm and dry. No rash noted. She is not diaphoretic. No erythema. No pallor.  Psychiatric: She has a normal mood and affect. Her behavior is normal.  Nursing note and vitals reviewed.   ED Course  Procedures (including critical care time) Labs Review Labs Reviewed  BASIC METABOLIC PANEL - Abnormal; Notable for the following:    Glucose, Bld 133 (*)    BUN 52 (*)    Creatinine, Ser 1.94 (*)    GFR calc non Af Amer 24 (*)    GFR calc Af Amer 28 (*)    All other components within normal limits  CBC - Abnormal; Notable for the following:    WBC 22.8 (*)    RBC 3.32 (*)    Hemoglobin 10.4 (*)    HCT 31.8 (*)    All other components within normal limits  BRAIN NATRIURETIC PEPTIDE  I-STAT TROPOININ, ED    Imaging Review Dg Chest 2 View  08/10/2015  CLINICAL DATA:  Chest pain for 1 day EXAM: CHEST  2 VIEW COMPARISON:  10/16/2013 FINDINGS: Cardiac shadow is mildly enlarged but stable.  The lungs are well aerated bilaterally. Chronic blunting of the left costophrenic angle is seen. No acute infiltrate is noted. No acute bony abnormality is seen. IMPRESSION: No active cardiopulmonary disease. Electronically Signed   By: Inez Catalina M.D.   On: 08/10/2015 21:05   I have personally reviewed and evaluated these images and lab results as part of my medical decision-making.   EKG Interpretation None      2255 - Patient's daughter, also POA, at bedside. Daughter reports onset of chest pain at 1600 with c/o radiation to the back and shoulders as well as the throat. Pain relieved each time with NTG, but returned following 3rd tablet. Daughter called EMS given persistent symptoms and inability to continue giving more NTG. Daughter states patient had associated diaphoresis and lightheadedness. Patient is on Plavix.  2305 - Heart Score 5 c/w moderate risk of MACE  MDM   Final diagnoses:  Chest pain, unspecified chest pain type    Patient admitted for chest pain r/o. Chest pain free in the ED. VSS.   Filed Vitals:   08/10/15 2001 08/10/15 2019  BP:  148/64  Pulse:  66  Temp:  98 F (36.7 C)  TempSrc:  Oral  Resp:  18  SpO2: 98% 99%     Antonietta Breach, PA-C 08/10/15 2311  Lacretia Leigh, MD 08/13/15 [phone]

## 2015-08-11 ENCOUNTER — Observation Stay (HOSPITAL_COMMUNITY): Payer: Medicare Other

## 2015-08-11 ENCOUNTER — Telehealth: Payer: Self-pay | Admitting: Internal Medicine

## 2015-08-11 ENCOUNTER — Encounter (HOSPITAL_COMMUNITY): Payer: Self-pay | Admitting: Internal Medicine

## 2015-08-11 ENCOUNTER — Observation Stay (HOSPITAL_BASED_OUTPATIENT_CLINIC_OR_DEPARTMENT_OTHER): Payer: Medicare Other

## 2015-08-11 DIAGNOSIS — I25110 Atherosclerotic heart disease of native coronary artery with unstable angina pectoris: Secondary | ICD-10-CM | POA: Diagnosis not present

## 2015-08-11 DIAGNOSIS — E1159 Type 2 diabetes mellitus with other circulatory complications: Secondary | ICD-10-CM | POA: Diagnosis not present

## 2015-08-11 DIAGNOSIS — R079 Chest pain, unspecified: Secondary | ICD-10-CM

## 2015-08-11 DIAGNOSIS — M1612 Unilateral primary osteoarthritis, left hip: Secondary | ICD-10-CM | POA: Diagnosis not present

## 2015-08-11 DIAGNOSIS — N184 Chronic kidney disease, stage 4 (severe): Secondary | ICD-10-CM | POA: Diagnosis present

## 2015-08-11 DIAGNOSIS — I10 Essential (primary) hypertension: Secondary | ICD-10-CM

## 2015-08-11 DIAGNOSIS — E038 Other specified hypothyroidism: Secondary | ICD-10-CM

## 2015-08-11 DIAGNOSIS — E039 Hypothyroidism, unspecified: Secondary | ICD-10-CM

## 2015-08-11 LAB — CREATININE, SERUM
Creatinine, Ser: 1.97 mg/dL — ABNORMAL HIGH (ref 0.44–1.00)
GFR calc Af Amer: 27 mL/min — ABNORMAL LOW (ref >60)
GFR calc non Af Amer: 23 mL/min — ABNORMAL LOW (ref >60)

## 2015-08-11 LAB — GLUCOSE, CAPILLARY
GLUCOSE-CAPILLARY: 105 mg/dL — AB (ref 65–99)
GLUCOSE-CAPILLARY: 205 mg/dL — AB (ref 65–99)
GLUCOSE-CAPILLARY: 115 mg/dL — AB (ref 65–99)
Glucose-Capillary: 145 mg/dL — ABNORMAL HIGH (ref 65–99)
Glucose-Capillary: 164 mg/dL — ABNORMAL HIGH (ref 65–99)

## 2015-08-11 LAB — CBC
HCT: 29.9 % — ABNORMAL LOW (ref 36.0–46.0)
Hemoglobin: 9.6 g/dL — ABNORMAL LOW (ref 12.0–15.0)
MCH: 30.8 pg (ref 26.0–34.0)
MCHC: 32.1 g/dL (ref 30.0–36.0)
MCV: 95.8 fL (ref 78.0–100.0)
PLATELETS: 208 10*3/uL (ref 150–400)
RBC: 3.12 MIL/uL — AB (ref 3.87–5.11)
RDW: 13.7 % (ref 11.5–15.5)
WBC: 20.3 10*3/uL — ABNORMAL HIGH (ref 4.0–10.5)

## 2015-08-11 LAB — TROPONIN I
Troponin I: <0.03 ng/mL (ref <0.031)
Troponin I: <0.03 ng/mL (ref <0.031)

## 2015-08-11 MED ORDER — ALBUTEROL SULFATE HFA 108 (90 BASE) MCG/ACT IN AERS: 2.0000 | INHALATION_SPRAY | Freq: Four times a day (QID) | RESPIRATORY_TRACT | Status: DC | PRN

## 2015-08-11 MED ORDER — ACETAMINOPHEN 325 MG PO TABS
650.0000 mg | ORAL_TABLET | ORAL | Status: DC | PRN
  Administered 2015-08-12: 650 mg via ORAL
  Filled 2015-08-11: qty 2

## 2015-08-11 MED ORDER — ASPIRIN 81 MG PO CHEW
81.0000 mg | CHEWABLE_TABLET | Freq: Every day | ORAL | Status: DC
  Administered 2015-08-12 – 2015-08-14 (×2): 81 mg via ORAL
  Filled 2015-08-11 (×2): qty 1

## 2015-08-11 MED ORDER — PREDNISONE 10 MG PO TABS: 10.0000 mg | ORAL_TABLET | Freq: Every day | ORAL | Status: DC

## 2015-08-11 MED ORDER — ENOXAPARIN SODIUM 30 MG/0.3ML ~~LOC~~ SOLN
30.0000 mg | SUBCUTANEOUS | Status: DC
  Administered 2015-08-11 – 2015-08-13 (×3): 30 mg via SUBCUTANEOUS
  Filled 2015-08-11 (×4): qty 0.3

## 2015-08-11 MED ORDER — VITAMIN D (ERGOCALCIFEROL) 1.25 MG (50000 UNIT) PO CAPS: 50000.0000 [IU] | ORAL_CAPSULE | ORAL | Status: DC

## 2015-08-11 MED ORDER — TRAMADOL HCL 50 MG PO TABS: 50.0000 mg | ORAL_TABLET | Freq: Every day | ORAL | Status: DC | PRN

## 2015-08-11 MED ORDER — OMEGA-3-ACID ETHYL ESTERS 1 G PO CAPS
1.0000 g | ORAL_CAPSULE | Freq: Every day | ORAL | Status: DC
  Administered 2015-08-11 – 2015-08-14 (×3): 1 g via ORAL
  Filled 2015-08-11 (×4): qty 1

## 2015-08-11 MED ORDER — TIOTROPIUM BROMIDE MONOHYDRATE 18 MCG IN CAPS
18.0000 ug | ORAL_CAPSULE | Freq: Every day | RESPIRATORY_TRACT | Status: DC
  Administered 2015-08-13 – 2015-08-14 (×2): 18 ug via RESPIRATORY_TRACT
  Filled 2015-08-11: qty 5

## 2015-08-11 MED ORDER — METOPROLOL SUCCINATE ER 100 MG PO TB24
100.0000 mg | ORAL_TABLET | Freq: Every day | ORAL | Status: DC
  Administered 2015-08-11 – 2015-08-14 (×4): 100 mg via ORAL
  Filled 2015-08-11 (×4): qty 1

## 2015-08-11 MED ORDER — ADULT MULTIVITAMIN W/MINERALS CH
1.0000 | ORAL_TABLET | Freq: Every day | ORAL | Status: DC
Start: 2015-08-11 — End: 2015-08-14
  Administered 2015-08-11 – 2015-08-13 (×3): 1 via ORAL
  Filled 2015-08-11 (×3): qty 1

## 2015-08-11 MED ORDER — TRAMADOL HCL 50 MG PO TABS: 100.0000 mg | ORAL_TABLET | ORAL | Status: DC

## 2015-08-11 MED ORDER — POLYETHYLENE GLYCOL 3350 17 G PO PACK
17.0000 g | PACK | Freq: Every day | ORAL | Status: DC
  Administered 2015-08-12 – 2015-08-14 (×3): 17 g via ORAL
  Filled 2015-08-11 (×4): qty 1

## 2015-08-11 MED ORDER — NITROGLYCERIN 0.4 MG SL SUBL
0.4000 mg | SUBLINGUAL_TABLET | SUBLINGUAL | Status: DC | PRN
Start: 2015-08-11 — End: 2015-08-14

## 2015-08-11 MED ORDER — ISOSORBIDE MONONITRATE ER 60 MG PO TB24
60.0000 mg | ORAL_TABLET | Freq: Every day | ORAL | Status: DC
  Administered 2015-08-11 – 2015-08-14 (×4): 60 mg via ORAL
  Filled 2015-08-11 (×4): qty 1

## 2015-08-11 MED ORDER — INSULIN ASPART 100 UNIT/ML ~~LOC~~ SOLN
0.0000 [IU] | Freq: Three times a day (TID) | SUBCUTANEOUS | Status: DC
Start: 2015-08-11 — End: 2015-08-14
  Administered 2015-08-11: 3 [IU] via SUBCUTANEOUS
  Administered 2015-08-11 – 2015-08-14 (×4): 2 [IU] via SUBCUTANEOUS

## 2015-08-11 MED ORDER — MOMETASONE FURO-FORMOTEROL FUM 200-5 MCG/ACT IN AERO
2.0000 | INHALATION_SPRAY | Freq: Two times a day (BID) | RESPIRATORY_TRACT | Status: DC
Start: 2015-08-11 — End: 2015-08-14
  Administered 2015-08-11 – 2015-08-14 (×5): 2 via RESPIRATORY_TRACT
  Filled 2015-08-11: qty 8.8

## 2015-08-11 MED ORDER — TRAMADOL HCL 50 MG PO TABS
100.0000 mg | ORAL_TABLET | Freq: Four times a day (QID) | ORAL | Status: DC
  Administered 2015-08-11 – 2015-08-12 (×3): 100 mg via ORAL
  Administered 2015-08-12: 50 mg via ORAL
  Administered 2015-08-12 – 2015-08-14 (×8): 100 mg via ORAL
  Filled 2015-08-11 (×12): qty 2

## 2015-08-11 MED ORDER — ONDANSETRON HCL 4 MG/2ML IJ SOLN: 4.0000 mg | Freq: Four times a day (QID) | INTRAMUSCULAR | Status: DC | PRN

## 2015-08-11 MED ORDER — PREDNISONE 1 MG PO TABS: 4.0000 mg | ORAL_TABLET | Freq: Every day | ORAL | Status: DC

## 2015-08-11 MED ORDER — LEVOTHYROXINE SODIUM 75 MCG PO TABS
75.0000 ug | ORAL_TABLET | Freq: Every day | ORAL | Status: DC
  Administered 2015-08-11 – 2015-08-14 (×4): 75 ug via ORAL
  Filled 2015-08-11 (×5): qty 1

## 2015-08-11 MED ORDER — ASPIRIN EC 325 MG PO TBEC
325.0000 mg | DELAYED_RELEASE_TABLET | Freq: Every day | ORAL | Status: DC
  Administered 2015-08-11: 325 mg via ORAL
  Filled 2015-08-11: qty 1

## 2015-08-11 MED ORDER — TRAMADOL HCL ER 300 MG PO TB24: 300.0000 mg | ORAL_TABLET | Freq: Every day | ORAL | Status: DC

## 2015-08-11 MED ORDER — PREDNISONE 10 MG PO TABS
14.0000 mg | ORAL_TABLET | Freq: Every day | ORAL | Status: DC
  Administered 2015-08-11 – 2015-08-14 (×3): 14 mg via ORAL
  Filled 2015-08-11 (×5): qty 4

## 2015-08-11 MED ORDER — ALBUTEROL SULFATE (2.5 MG/3ML) 0.083% IN NEBU: 2.5000 mg | INHALATION_SOLUTION | Freq: Four times a day (QID) | RESPIRATORY_TRACT | Status: DC | PRN

## 2015-08-11 NOTE — Telephone Encounter (Signed)
Pt is is in the hospital at [facility]. She says it is urgent that she talked to Dr Debara Pickett. She said some complications have come up. I told her she need to go to the nurse station at the hospital,I told her I would send this note back. Rebecca [name]

## 2015-08-11 NOTE — Telephone Encounter (Signed)
Thanks .Marland Kitchen Looks like Dr. Tamala Julian saw her today.  Dr. Lemmie Evens

## 2015-08-11 NOTE — Care Management Obs Status (Signed)
[place] NOTIFICATION   Patient Details  Name: Rebecca [name] MRN: [id number] Date of Birth: April 12, 1941   Medicare Observation Status Notification Given:  Yes    Dawayne Patricia, RN 08/11/2015, 4:38 PM

## 2015-08-11 NOTE — Progress Notes (Signed)
  Echocardiogram 2D Echocardiogram has been performed.  Jennette Dubin 08/11/2015, 10:51 AM

## 2015-08-11 NOTE — Progress Notes (Addendum)
PROGRESS NOTE  Rebecca [name] [id number] DOB: 02-Jul-1940 DOA: 08/10/2015 PCP: [name], MD  HPI/Recap of past 24 hours:  Sitting on the edge of her bed, denies pain  Assessment/Plan: Principal Problem:   Chest pain with moderate risk of acute coronary syndrome Active Problems:   Hypothyroidism   Anemia in chronic renal disease   Schizoaffective disorder (HCC)   Essential hypertension   Polymyalgia rheumatica (HCC)   Diabetes mellitus, type 2 ([place])   History of TIA - May 2015- Plavix   MGUS (monoclonal gammopathy of unknown significance)   CKD stage 4, GFR 15-29 ml/min   1. Chest pain - given history of CAD status post stenting concerning for angina. Chest pain-free at this time. Continue with Plavix/statin/betablockes. cardiac markers negative x3. ekg no acute changes. echopending, Cardiology consulted, plan to stress test tomorrow due to high risk patient.         2. Polymyalgia rheumatica on prednisone with chronic leukocytosis, patient is currently on prednisone 14mg  po qd, she has been working with her rheumatology to taper prednisone to 7-8mg  qd (the dose she has been on for many years) 3. MGUS being followed by oncologist. Cbc and renal function at baseline 4. COPD - not wheezing. 5. noninsulin dependent Diabetes mellitus on diet - likely chronic steroid induced. on sliding scale in the hospital.     6. Increased bruising and left lower extremity swelling - as per the patient's daughter patient's left lower extremity swelling has worsened recently. Patient has had a Doppler last week which was negative but patient's daughter states the swelling as needed further worsened.  recheck Doppler pending.  7. Right hip pain - x-ray of the pelvis: No lytic or blastic bony lesion is observed. There is mild degenerative change of the right hip.  Code Status: full  Family Communication: patient  In room And her daughter in the hallway for prolonged discussion  Disposition  Plan: home in 1-2 days if cleared by cardiology   Consultants:  cardiology  Procedures:  Stress test 2/28  Antibiotics:  none   Objective: BP 116/60 mmHg  Pulse 77  Temp(Src) 98 F (36.7 C) (Oral)  Resp 18  Ht 5\' 4"  (1.626 m)  Wt 71.895 kg (158 lb 8 oz)  BMI 27.19 kg/m2  SpO2 100%  Intake/Output Summary (Last 24 hours) at 08/11/15 1705 Last data filed at 08/11/15 1400  Gross per 24 hour  Intake    360 ml  Output    800 ml  Net   -440 ml   Filed Weights   08/11/15 0023  Weight: 71.895 kg (158 lb 8 oz)    Exam:   General:  NAD  Cardiovascular: RRR  Respiratory: CTABL  Abdomen: Soft/ND/NT, positive BS  Musculoskeletal: left lower extremity Edema  Neuro: aaox3, mild baseline memory impairment due to prior CVA per daughter.  Data Reviewed: Basic Metabolic Panel:  Recent Labs Lab 08/10/15 2045 08/11/15 0236  NA 138  --   K 4.8  --   CL 106  --   CO2 23  --   GLUCOSE 133*  --   BUN 52*  --   CREATININE 1.94* 1.97*  CALCIUM 9.5  --    Liver Function Tests: No results for input(s): AST, ALT, ALKPHOS, BILITOT, PROT, ALBUMIN in the last 168 hours. No results for input(s): LIPASE, AMYLASE in the last 168 hours. No results for input(s): AMMONIA in the last 168 hours. CBC:  Recent Labs Lab 08/10/15 2045 08/11/15 0236  WBC 22.8* 20.3*  NEUTROABS 21.3*  --   HGB 10.4* 9.6*  HCT 31.8* 29.9*  MCV 95.8 95.8  PLT 223 208   Cardiac Enzymes:    Recent Labs Lab 08/11/15 0236 08/11/15 0832 08/11/15 1455  TROPONINI <0.03 <0.03 <0.03   BNP (last 3 results)  Recent Labs  08/10/15 2045  BNP 91.0    ProBNP (last 3 results) No results for input(s): PROBNP in the last 8760 hours.  CBG:  Recent Labs Lab 08/11/15 0032 08/11/15 0633 08/11/15 1140  GLUCAP 145* 105* 164*    No results found for this or any previous visit (from the past 240 hour(s)).   Studies: Dg Chest 2 View  08/10/2015  CLINICAL DATA:  Chest pain for 1 day EXAM:  CHEST  2 VIEW COMPARISON:  10/16/2013 FINDINGS: Cardiac shadow is mildly enlarged but stable. The lungs are well aerated bilaterally. Chronic blunting of the left costophrenic angle is seen. No acute infiltrate is noted. No acute bony abnormality is seen. IMPRESSION: No active cardiopulmonary disease. Electronically Signed   By: Inez Catalina M.D.   On: 08/10/2015 21:05   Dg Pelvis Portable  08/11/2015  CLINICAL DATA:  Right hip pain, monoclonal gammopathy, polymyalgia rheumatica. EXAM: PORTABLE PELVIS 1-2 VIEWS COMPARISON:  AP pelvis of September 26, 2012. FINDINGS: Bowel gas limits evaluation of portions of the sacrum and iliac bones. The bony pelvis is adequately mineralized. There is no lytic or blastic lesion. There is no acute fracture nor dislocation. The sacrum and SI joints are normal. The right hip exhibits mild degenerative joint space loss. IMPRESSION: No lytic or blastic bony lesion is observed. There is mild degenerative change of the right hip. Electronically Signed   By: David  Martinique M.D.   On: 08/11/2015 07:45    Scheduled Meds: . [START ON 08/12/2015] aspirin  81 mg Oral Daily  . clopidogrel  75 mg Oral Q breakfast  . clozapine  300 mg Oral QHS  . diltiazem  180 mg Oral Q breakfast  . docusate sodium  200 mg Oral QHS  . enoxaparin (LOVENOX) injection  30 mg Subcutaneous Q24H  . ferrous sulfate  325 mg Oral BID WC  . insulin aspart  0-9 Units Subcutaneous TID WC  . isosorbide mononitrate  60 mg Oral Q breakfast  . levothyroxine  75 mcg Oral QAC breakfast  . loratadine  10 mg Oral Daily  . metoprolol succinate  100 mg Oral Q breakfast  . mometasone-formoterol  2 puff Inhalation BID  . multivitamin with minerals  1 tablet Oral QHS  . omega-3 acid ethyl esters  1 g Oral Daily  . pantoprazole  40 mg Oral QAC breakfast  . polyethylene glycol  17 g Oral Q1400  . pravastatin  20 mg Oral QHS  . predniSONE  14 mg Oral Q breakfast  . tiotropium  18 mcg Inhalation Daily  . traMADol  100  mg Oral 4 times per day  . [START ON 08/15/2015] Vitamin D (Ergocalciferol)  50,000 Units Oral Q7 days    Continuous Infusions:    Time spent: 23mins from 3pm to 3:25pm  Faduma Cho MD, PhD  [facility] Pager [phone]. If 7PM-7AM, please contact night-coverage at [email or web address], password [facility] 08/11/2015, 5:05 PM

## 2015-08-11 NOTE — H&P (Signed)
[facility] History and Physical  Rebecca [name] [id number] DOB: [birth year] DOA: 08/10/2015  Referring physician: ER physician. PCP: [name], MD  Specialists: [facility] cardiology.  Chief Complaint: Chest pain.  HPI: Rebecca [name] is a 75 y.o. female with history of CAD status post stenting in 2006 and stress test in 2014 which was unremarkable, diabetes mellitus type 2 on diet, polymyalgia rheumatica on prednisone COPD, diastolic CHF presents to the ER because of chest pain. Patient started developing chest pain last evening at home and patient's daughter gave 3 and sublingual nitroglycerin in a span of 30 minutes. Patient chest pain initially improved but started recurring at that point EMS was called and patient received aspirin following which patient chest pain resolved. Patient is chest pain-free at this time. Denies any associated shortness of breath or productive cough fever chills. Patient given history of CAD will be admitted to further rule out ACS. In addition patient has been complaining of increasing right hip pain denies any fall. Patient also has noticed increased bruising of the extremities without any fall. Patient notices increasing swelling in the left lower extremity.  Review of Systems: As presented in the history of presenting illness, rest negative.  Past Medical History  Diagnosis Date  . Diabetes mellitus   . Frozen shoulder     right  . Obesity   . Renal insufficiency   . COPD (chronic obstructive pulmonary disease) ([place])   . Shortness of breath   . Stroke ([place])   . CAD (coronary artery disease) 2006    Taxus stent of mid RCA. negative myoview 2014 and normal LV function  . Diastolic heart failure, NYHA class 1 ([place])   . Polymyalgia rheumatica ([place])   . 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 Dysuria 01/06/2015   Past Surgical History  Procedure Laterality Date  . Partial hysterectomy  1995  . Abdominal hysterectomy     . Cholecystectomy    . Coronary angioplasty with stent placement  2006    Taxus DES to RCA, 50 % residual   Social History:  reports that she quit smoking about 11 years ago. She has never used smokeless tobacco. She reports that she does not drink alcohol or use illicit drugs. Where does patient live Home. Can patient participate in ADLs? Yes.  Allergies  Allergen Reactions  . Levaquin [Levofloxacin] Other (See Comments)    Ruptured tendon    Family History:  Family History  Problem Relation Age of Onset  . Diabetes Mother   . Heart disease Mother   . Heart disease Father   . Arthritis Father   . Healthy Sister   . Heart disease Brother   . Cancer Brother     colon ca  . Osteoporosis Sister   . Heart disease Brother   . Diabetes Brother   . Heart disease Brother   . Diabetes Brother   . Diabetes Brother   . Healthy Brother   . Healthy Brother   . Healthy Brother   . Cancer Maternal Uncle     blood condition  . Cancer Daughter     breast ca  . Cancer Cousin     N[employer]      Prior to Admission medications   Medication Sig Start Date End Date Taking? Authorizing Provider  acetaminophen (TYLENOL) 500 MG tablet Take 1,000 mg by mouth 2 (two) times daily. Take 2 tablets (1000 mg) by mouth daily with breakfast, take 2 tablets (1000 mg) at 2pm  Yes Historical Provider, MD  albuterol (PROAIR HFA) 108 (90 Base) MCG/ACT inhaler Inhale 2 puffs into the lungs every 6 (six) hours as needed for wheezing or shortness of breath.   Yes Historical Provider, MD  alendronate (FOSAMAX) 70 MG tablet Take 1 tablet (70 mg total) by mouth every 7 (seven) days. Take with a full glass of water on an empty stomach. Patient taking differently: Take 70 mg by mouth every 7 (seven) days. Take with a full glass of water on an empty stomach. On Saturdays 06/29/13  Yes Kinnie Feil, MD  cetirizine (ZYRTEC) 10 MG tablet Take 10 mg by mouth at bedtime.    Yes Historical Provider, MD  clopidogrel  (PLAVIX) 75 MG tablet Take 1 tablet (75 mg total) by mouth daily with breakfast. Patient taking differently: Take 75 mg by mouth at bedtime.  10/18/13  Yes Thurnell Lose, MD  clozapine (CLOZARIL) 200 MG tablet Take 300 mg by mouth at bedtime. ]   Yes Historical Provider, MD  Cranberry (SM CRANBERRY) 300 MG tablet Take 300 mg by mouth at bedtime.    Yes Historical Provider, MD  diltiazem (CARDIZEM LA) 180 MG 24 hr tablet Take 180 mg by mouth daily with breakfast. 07/16/15  Yes Historical Provider, MD  docusate sodium (COLACE) 100 MG capsule Take 200 mg by mouth at bedtime.  03/01/12  Yes Katherina Mires, MD  ferrous sulfate 325 (65 FE) MG EC tablet Take 325 mg by mouth 2 (two) times daily.     Yes Historical Provider, MD  Fluticasone-Salmeterol (ADVAIR) 250-50 MCG/DOSE AEPB Inhale 1 puff into the lungs at bedtime.   Yes Historical Provider, MD  isosorbide mononitrate (IMDUR) 60 MG 24 hr tablet Take 1 tablet (60 mg total) by mouth daily. Patient taking differently: Take 60 mg by mouth daily with breakfast.  03/14/13  Yes Isaiah Serge, NP  levothyroxine (SYNTHROID, LEVOTHROID) 75 MCG tablet Take 75 mcg by mouth daily before breakfast.   Yes Historical Provider, MD  metoprolol succinate (TOPROL-XL) 100 MG 24 hr tablet Take 100 mg by mouth daily with breakfast. Take with or immediately following a meal.   Yes Historical Provider, MD  Multiple Vitamin (MULTIVITAMIN WITH MINERALS) TABS Take 1 tablet by mouth at bedtime. [employer]   Yes Historical Provider, MD  nitroGLYCERIN (NITROSTAT) 0.4 MG SL tablet Place 1 tablet (0.4 mg total) under the tongue every 5 (five) minutes as needed. For chest pain Patient taking differently: Place 0.4 mg under the tongue every 5 (five) minutes as needed for chest pain.  01/07/15  Yes Pixie Casino, MD  Omega-3 Fatty Acids (FISH OIL PO) Take 1 capsule by mouth at bedtime.   Yes Historical Provider, MD  pantoprazole (PROTONIX) 40 MG tablet Take 1 tablet (40 mg total) by  mouth daily. Patient taking differently: Take 40 mg by mouth daily before breakfast.  04/03/13  Yes Pixie Casino, MD  Polyethyl Glycol-Propyl Glycol (SYSTANE OP) Place 1 drop into both eyes 2 (two) times daily as needed (dry eyes).    Yes Historical Provider, MD  polyethylene glycol (MIRALAX / GLYCOLAX) packet Take 17 g by mouth daily at 2 PM. Mix in 8 oz liquid and drink   Yes Historical Provider, MD  pravastatin (PRAVACHOL) 20 MG tablet Take 20 mg by mouth at bedtime.   Yes Historical Provider, MD  predniSONE (DELTASONE) 1 MG tablet Take 4 mg by mouth daily with breakfast. Take with #2 5 mg tablets for a  14 mg dose 07/16/15  Yes Historical Provider, MD  predniSONE (DELTASONE) 5 MG tablet Take 10 mg by mouth daily with breakfast. Take with #4 1 mg tablets for a 14 mg dose 07/23/15  Yes Historical Provider, MD  temazepam (RESTORIL) 15 MG capsule Take 15 mg by mouth at bedtime as needed for sleep.    Yes Historical Provider, MD  Tiotropium Bromide Monohydrate (SPIRIVA RESPIMAT) 2.5 MCG/ACT AERS Inhale 2 puffs into the lungs daily.   Yes Historical Provider, MD  traMADol (ULTRAM) 50 MG tablet Take 50-100 mg by mouth See admin instructions. Take 2 tablets (100 mg) by mouth daily at 2pm, may also take 1-2 tablets as needed for pain   Yes Historical Provider, MD  traMADol (ULTRAM-ER) 300 MG 24 hr tablet Take 300 mg by mouth daily with breakfast.    Yes Historical Provider, MD  Vitamin D, Ergocalciferol, (DRISDOL) 50000 UNITS CAPS capsule Take 1 capsule (50,000 Units total) by mouth every 7 (seven) days. Patient taking differently: Take 50,000 Units by mouth every 7 (seven) days. Fridays 08/27/13  Yes Kinnie Feil, MD    Physical Exam: Filed Vitals:   08/10/15 2245 08/10/15 2315 08/10/15 2330 08/11/15 0023  BP: 117/48 127/46 119/54 130/44  Pulse: 63 65 66 367  Temp:    97.8 F (36.6 C)  TempSrc:    Oral  Resp: 22 21 17 18   Height:    5\' 4"  (1.626 m)  Weight:    158 lb 8 oz (71.895 kg)  SpO2:  100% 99% 99% 100%     General:  Moderately built and nourished.  Eyes: Anicteric no pallor.  ENT: No discharge from the ears eyes nose and mouth.  Neck: No mass felt.  Cardiovascular: S1-S2 heard.  Respiratory: No rhonchi or crepitations.  Abdomen: Soft nontender bowel sounds present.  Skin: Multiple bruises all over the extremities.  Musculoskeletal: Left lower extremity edema and right hip pain.  Psychiatric: Appears normal.  Neurologic: Alert awake oriented to time place and person. Moves all extremities.  Labs on Admission:  Basic Metabolic Panel:  Recent Labs Lab 08/10/15 2045  NA 138  K 4.8  CL 106  CO2 23  GLUCOSE 133*  BUN 52*  CREATININE 1.94*  CALCIUM 9.5   Liver Function Tests: No results for input(s): AST, ALT, ALKPHOS, BILITOT, PROT, ALBUMIN in the last 168 hours. No results for input(s): LIPASE, AMYLASE in the last 168 hours. No results for input(s): AMMONIA in the last 168 hours. CBC:  Recent Labs Lab 08/10/15 2045  WBC 22.8*  NEUTROABS 21.3*  HGB 10.4*  HCT 31.8*  MCV 95.8  PLT 223   Cardiac Enzymes: No results for input(s): CKTOTAL, [name], [name], TROPONINI in the last 168 hours.  BNP (last 3 results)  Recent Labs  08/10/15 2045  BNP 91.0    ProBNP (last 3 results) No results for input(s): PROBNP in the last 8760 hours.  CBG:  Recent Labs Lab 08/11/15 0032  GLUCAP 145*    Radiological Exams on Admission: Dg Chest 2 View  08/10/2015  CLINICAL DATA:  Chest pain for 1 day EXAM: CHEST  2 VIEW COMPARISON:  10/16/2013 FINDINGS: Cardiac shadow is mildly enlarged but stable. The lungs are well aerated bilaterally. Chronic blunting of the left costophrenic angle is seen. No acute infiltrate is noted. No acute bony abnormality is seen. IMPRESSION: No active cardiopulmonary disease. Electronically Signed   By: Inez Catalina M.D.   On: 08/10/2015 21:05    EKG: Independently  reviewed. Normal sinus  rhythm.  Assessment/Plan Principal Problem:   Chest pain Active Problems:   Hypothyroidism   Polymyalgia rheumatica (HCC)   Diabetes mellitus, type 2 (HCC)   MGUS (monoclonal gammopathy of unknown significance)   CKD (chronic kidney disease) stage 4, GFR 15-29 ml/min (HCC)   Pain in the chest   1. Chest pain - given history of CAD status post stenting concerning for angina. Chest pain-free at this time. Continue with Plavix. Cycle cardiac markers statins. Nitroglycerin and beta blockers. I have requested cardiology consult. 2. Right hip pain - check x-ray of the pelvis. 3. Increased bruising and left lower extremity swelling - as per the patient's daughter patient's left lower extremity swelling has worsened recently. Patient has had a Doppler last week which was negative but patient's daughter states the swelling as needed further worsened. Will recheck Doppler. 4. Chronic leukocytosis probably from steroids - patient afebrile at this time closely follow CBC. 5. Polymyalgia rheumatica on prednisone. 6. MGUS being followed by oncologist. 7. COPD - not wheezing. 8. Diabetes mellitus on diet - patient has been placed on sliding scale coverage.   DVT Prophylaxis Lovenox.  Code Status: Full code.  Family Communication: Discussed with patient's daughter.  Disposition Plan: Admit for observation.    Adama Ivins N. [facility] Pager [phone].  If 7PM-7AM, please contact night-coverage [email or web address] Password [email or web address] 08/11/2015, 2:26 AM

## 2015-08-11 NOTE — Telephone Encounter (Signed)
Returned call to daughter.  Pt admitted yesterday to hospital. Caller wanted to make sure cardiology service would round on her while in hospital. Daughter states increased concern over the fact that she has had to give more frequent doses of NTG to patient to attempt relief of anginal symptoms. Notes she is quick to meet 3 pill max in a given span and still needing to give pt more. This was the reason she brought pt to hospital.  She also states pt has continued to have an indiscernable pain in her left leg. She's upset because she asked the hospitalist if this would be addressed and states she was not given a clear answer about this. Notes she is concerned for blood clot or other possible complication. She could not give me a lot of details about the pain specifically, but notes it precedes the hospitalization by several days and has been limiting pt mobility and well-being.  Daughter aware I will send to Dr. Debara Pickett so he is aware of current admission.

## 2015-08-11 NOTE — Consult Note (Signed)
Reason for Consult:   Chest pain  Requesting Physician: Dr Erlinda Hong Primary Cardiologist Dr Debara Pickett  HPI:   75 y/o female previously followed by Dr Rollene Fare, now Dr Debara Pickett. LOV July 2016. She has a history of CAD- s/p RCA Taxus stent placed 01/20/05.  She had 50 AV groove but no other disease.  Myoview 2014 was low risk, echo in May 2015 showed normal LVF.  She has multiple other medical problems including DM, HTN, PMR, CRI-4, TIA May 2015, and schizoaffective disorder.  She has been having chest pain at home recently and her daughter called the office 07/08/15.  The pt was to see Dr Debara Pickett.  She presented to the ED 08/11/15 with complaints of chest pain.  Her daughter had given her NTG x 3.  The pt's history this am is difficult- she is a rambling historian. She denies any associated nause, diaphoresis, or SOB, She does say chest pain went into her throat. Troponin are negative so far.   PMHx:  Past Medical History  Diagnosis Date  . Diabetes mellitus   . Frozen shoulder     right  . Obesity   . Renal insufficiency   . COPD (chronic obstructive pulmonary disease) ([place])   . Shortness of breath   . Stroke ([place])   . CAD (coronary artery disease) 2006    Taxus stent of mid RCA. negative myoview 2014 and normal LV function  . Diastolic heart failure, NYHA class 1 ([place])   . Polymyalgia rheumatica ([place])   . 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 Dysuria 01/06/2015    Past Surgical History  Procedure Laterality Date  . Partial hysterectomy  1995  . Abdominal hysterectomy    . Cholecystectomy    . Coronary angioplasty with stent placement  2006    Taxus DES to RCA, 50 % residual    SOCHx:  reports that she quit smoking about 11 years ago. She has never used smokeless tobacco. She reports that she does not drink alcohol or use illicit drugs.  FAMHx: Family History  Problem Relation Age of Onset  . Diabetes Mother   . Heart disease Mother   . Heart  disease Father   . Arthritis Father   . Healthy Sister   . Heart disease Brother   . Cancer Brother     colon ca  . Osteoporosis Sister   . Heart disease Brother   . Diabetes Brother   . Heart disease Brother   . Diabetes Brother   . Diabetes Brother   . Healthy Brother   . Healthy Brother   . Healthy Brother   . Cancer Maternal Uncle     blood condition  . Cancer Daughter     breast ca  . Cancer Cousin     NHL    ALLERGIES: Allergies  Allergen Reactions  . Levaquin [Levofloxacin] Other (See Comments)    Ruptured tendon    ROS: Review of Systems: General: negative for chills, fever, night sweats or weight changes.  Cardiovascular: negative for dyspnea on exertion, edema, orthopnea, palpitations, paroxysmal nocturnal dyspnea or shortness of breath HEENT: negative for any visual disturbances, blindness, glaucoma Dermatological: negative for rash Respiratory: negative for cough, hemoptysis, or wheezing Urologic: negative for hematuria or dysuria Abdominal: negative for nausea, vomiting, diarrhea, bright red blood per rectum, melena, or hematemesis Neurologic: negative for visual changes, syncope, or dizziness Musculoskeletal: negative for back pain, joint pain, or swelling Psych: cooperative  and appropriate All other systems reviewed and are otherwise negative except as noted above.   HOME MEDICATIONS: Prior to Admission medications   Medication Sig Start Date End Date Taking? Authorizing Provider  acetaminophen (TYLENOL) 500 MG tablet Take 1,000 mg by mouth 2 (two) times daily. Take 2 tablets (1000 mg) by mouth daily with breakfast, take 2 tablets (1000 mg) at 2pm   Yes Historical Provider, MD  albuterol (PROAIR HFA) 108 (90 Base) MCG/ACT inhaler Inhale 2 puffs into the lungs every 6 (six) hours as needed for wheezing or shortness of breath.   Yes Historical Provider, MD  alendronate (FOSAMAX) 70 MG tablet Take 1 tablet (70 mg total) by mouth every 7 (seven) days. Take  with a full glass of water on an empty stomach. Patient taking differently: Take 70 mg by mouth every 7 (seven) days. Take with a full glass of water on an empty stomach. On Saturdays 06/29/13  Yes Kinnie Feil, MD  cetirizine (ZYRTEC) 10 MG tablet Take 10 mg by mouth at bedtime.    Yes Historical Provider, MD  clopidogrel (PLAVIX) 75 MG tablet Take 1 tablet (75 mg total) by mouth daily with breakfast. Patient taking differently: Take 75 mg by mouth at bedtime.  10/18/13  Yes Thurnell Lose, MD  clozapine (CLOZARIL) 200 MG tablet Take 300 mg by mouth at bedtime. ]   Yes Historical Provider, MD  Cranberry (SM CRANBERRY) 300 MG tablet Take 300 mg by mouth at bedtime.    Yes Historical Provider, MD  diltiazem (CARDIZEM LA) 180 MG 24 hr tablet Take 180 mg by mouth daily with breakfast. 07/16/15  Yes Historical Provider, MD  docusate sodium (COLACE) 100 MG capsule Take 200 mg by mouth at bedtime.  03/01/12  Yes Katherina Mires, MD  ferrous sulfate 325 (65 FE) MG EC tablet Take 325 mg by mouth 2 (two) times daily.     Yes Historical Provider, MD  Fluticasone-Salmeterol (ADVAIR) 250-50 MCG/DOSE AEPB Inhale 1 puff into the lungs at bedtime.   Yes Historical Provider, MD  isosorbide mononitrate (IMDUR) 60 MG 24 hr tablet Take 1 tablet (60 mg total) by mouth daily. Patient taking differently: Take 60 mg by mouth daily with breakfast.  03/14/13  Yes Isaiah Serge, NP  levothyroxine (SYNTHROID, LEVOTHROID) 75 MCG tablet Take 75 mcg by mouth daily before breakfast.   Yes Historical Provider, MD  metoprolol succinate (TOPROL-XL) 100 MG 24 hr tablet Take 100 mg by mouth daily with breakfast. Take with or immediately following a meal.   Yes Historical Provider, MD  Multiple Vitamin (MULTIVITAMIN WITH MINERALS) TABS Take 1 tablet by mouth at bedtime. [employer] [facility]   Yes Historical Provider, MD  nitroGLYCERIN (NITROSTAT) 0.4 MG SL tablet Place 1 tablet (0.4 mg total) under the tongue every 5 (five) minutes as  needed. For chest pain Patient taking differently: Place 0.4 mg under the tongue every 5 (five) minutes as needed for chest pain.  01/07/15  Yes Pixie Casino, MD  Omega-3 Fatty Acids (FISH OIL PO) Take 1 capsule by mouth at bedtime.   Yes Historical Provider, MD  pantoprazole (PROTONIX) 40 MG tablet Take 1 tablet (40 mg total) by mouth daily. Patient taking differently: Take 40 mg by mouth daily before breakfast.  04/03/13  Yes Pixie Casino, MD  Polyethyl Glycol-Propyl Glycol (SYSTANE OP) Place 1 drop into both eyes 2 (two) times daily as needed (dry eyes).    Yes Historical Provider, MD  polyethylene glycol (  MIRALAX / GLYCOLAX) packet Take 17 g by mouth daily at 2 PM. Mix in 8 oz liquid and drink   Yes Historical Provider, MD  pravastatin (PRAVACHOL) 20 MG tablet Take 20 mg by mouth at bedtime.   Yes Historical Provider, MD  predniSONE (DELTASONE) 1 MG tablet Take 4 mg by mouth daily with breakfast. Take with #2 5 mg tablets for a 14 mg dose 07/16/15  Yes Historical Provider, MD  predniSONE (DELTASONE) 5 MG tablet Take 10 mg by mouth daily with breakfast. Take with #4 1 mg tablets for a 14 mg dose 07/23/15  Yes Historical Provider, MD  temazepam (RESTORIL) 15 MG capsule Take 15 mg by mouth at bedtime as needed for sleep.    Yes Historical Provider, MD  Tiotropium Bromide Monohydrate (SPIRIVA RESPIMAT) 2.5 MCG/ACT AERS Inhale 2 puffs into the lungs daily.   Yes Historical Provider, MD  traMADol (ULTRAM) 50 MG tablet Take 50-100 mg by mouth See admin instructions. Take 2 tablets (100 mg) by mouth daily at 2pm, may also take 1-2 tablets as needed for pain   Yes Historical Provider, MD  traMADol (ULTRAM-ER) 300 MG 24 hr tablet Take 300 mg by mouth daily with breakfast.    Yes Historical Provider, MD  Vitamin D, Ergocalciferol, (DRISDOL) 50000 UNITS CAPS capsule Take 1 capsule (50,000 Units total) by mouth every 7 (seven) days. Patient taking differently: Take 50,000 Units by mouth every 7 (seven) days.  Fridays 08/27/13  Yes Kinnie Feil, MD    HOSPITAL MEDICATIONS: I have reviewed the patient's current medications.  VITALS: Blood pressure 130/45, pulse 70, temperature 98.2 F (36.8 C), temperature source Oral, resp. rate 18, height 5\' 4"  (1.626 m), weight 158 lb 8 oz (71.895 kg), SpO2 100 %.  PHYSICAL EXAM: General appearance: alert, cooperative, no distress, mildly obese and pale Neck: no carotid bruit and no JVD Lungs: clear to auscultation bilaterally Heart: regular rate and rhythm Abdomen: soft, non-tender; bowel sounds normal; no masses,  no organomegaly Extremities: extremities normal, atraumatic, no cyanosis or edema Pulses: 2+ and symmetric Skin: pale, cool, dry Neurologic: Grossly normal  LABS: Results for orders placed or performed during the hospital encounter of 08/10/15 (from the past 24 hour(s))  Basic metabolic panel     Status: Abnormal   Collection Time: 08/10/15  8:45 PM  Result Value Ref Range   Sodium 138 135 - 145 mmol/L   Potassium 4.8 3.5 - 5.1 mmol/L   Chloride 106 101 - 111 mmol/L   CO2 23 22 - 32 mmol/L   Glucose, Bld 133 (H) 65 - 99 mg/dL   BUN 52 (H) 6 - 20 mg/dL   Creatinine, Ser 1.94 (H) 0.44 - 1.00 mg/dL   Calcium 9.5 8.9 - 10.3 mg/dL   GFR calc non Af Amer 24 (L) >60 mL/min   GFR calc Af Amer 28 (L) >60 mL/min   Anion gap 9 5 - 15  CBC     Status: Abnormal   Collection Time: 08/10/15  8:45 PM  Result Value Ref Range   WBC 22.8 (H) 4.0 - 10.5 K/uL   RBC 3.32 (L) 3.87 - 5.11 MIL/uL   Hemoglobin 10.4 (L) 12.0 - 15.0 g/dL   HCT 31.8 (L) 36.0 - 46.0 %   MCV 95.8 78.0 - 100.0 fL   MCH 31.3 26.0 - 34.0 pg   MCHC 32.7 30.0 - 36.0 g/dL   RDW 13.5 11.5 - 15.5 %   Platelets 223 150 - 400 K/uL  Brain natriuretic peptide     Status: None   Collection Time: 08/10/15  8:45 PM  Result Value Ref Range   B Natriuretic Peptide 91.0 0.0 - 100.0 pg/mL  Differential     Status: Abnormal   Collection Time: 08/10/15  8:45 PM  Result Value Ref Range     Neutrophils Relative % 87 %   Neutro Abs 21.3 (H) 1.7 - 7.7 K/uL   Lymphocytes Relative 7 %   Lymphs Abs 1.8 0.7 - 4.0 K/uL   Monocytes Relative 5 %   Monocytes Absolute 1.3 (H) 0.1 - 1.0 K/uL   Eosinophils Relative 1 %   Eosinophils Absolute 0.1 0.0 - 0.7 K/uL   Basophils Relative 0 %   Basophils Absolute 0.0 0.0 - 0.1 K/uL  I-stat troponin, ED (not at [facility])     Status: None   Collection Time: 08/10/15  9:08 PM  Result Value Ref Range   Troponin i, poc 0.00 0.00 - 0.08 ng/mL   Comment 3          Glucose, capillary     Status: Abnormal   Collection Time: 08/11/15 12:32 AM  Result Value Ref Range   Glucose-Capillary 145 (H) 65 - 99 mg/dL  Troponin I (q 6hr x 3)     Status: None   Collection Time: 08/11/15  2:36 AM  Result Value Ref Range   Troponin I <0.03 <0.031 ng/mL  CBC     Status: Abnormal   Collection Time: 08/11/15  2:36 AM  Result Value Ref Range   WBC 20.3 (H) 4.0 - 10.5 K/uL   RBC 3.12 (L) 3.87 - 5.11 MIL/uL   Hemoglobin 9.6 (L) 12.0 - 15.0 g/dL   HCT 29.9 (L) 36.0 - 46.0 %   MCV 95.8 78.0 - 100.0 fL   MCH 30.8 26.0 - 34.0 pg   MCHC 32.1 30.0 - 36.0 g/dL   RDW 13.7 11.5 - 15.5 %   Platelets 208 150 - 400 K/uL  Creatinine, serum     Status: Abnormal   Collection Time: 08/11/15  2:36 AM  Result Value Ref Range   Creatinine, Ser 1.97 (H) 0.44 - 1.00 mg/dL   GFR calc non Af Amer 23 (L) >60 mL/min   GFR calc Af Amer 27 (L) >60 mL/min  Glucose, capillary     Status: Abnormal   Collection Time: 08/11/15  6:33 AM  Result Value Ref Range   Glucose-Capillary 105 (H) 65 - 99 mg/dL  Troponin I (q 6hr x 3)     Status: None   Collection Time: 08/11/15  8:32 AM  Result Value Ref Range   Troponin I <0.03 <0.031 ng/mL  Glucose, capillary     Status: Abnormal   Collection Time: 08/11/15 11:40 AM  Result Value Ref Range   Glucose-Capillary 164 (H) 65 - 99 mg/dL    EKG: Normal Sinus- no acute changes  IMAGING: Dg Chest 2 View  08/10/2015  CLINICAL DATA:  Chest  pain for 1 day EXAM: CHEST  2 VIEW COMPARISON:  10/16/2013 FINDINGS: Cardiac shadow is mildly enlarged but stable. The lungs are well aerated bilaterally. Chronic blunting of the left costophrenic angle is seen. No acute infiltrate is noted. No acute bony abnormality is seen. IMPRESSION: No active cardiopulmonary disease. Electronically Signed   By: Inez Catalina M.D.   On: 08/10/2015 21:05   Dg Pelvis Portable  08/11/2015  CLINICAL DATA:  Right hip pain, monoclonal gammopathy, polymyalgia rheumatica. EXAM: PORTABLE PELVIS 1-2 VIEWS  COMPARISON:  AP pelvis of September 26, 2012. FINDINGS: Bowel gas limits evaluation of portions of the sacrum and iliac bones. The bony pelvis is adequately mineralized. There is no lytic or blastic lesion. There is no acute fracture nor dislocation. The sacrum and SI joints are normal. The right hip exhibits mild degenerative joint space loss. IMPRESSION: No lytic or blastic bony lesion is observed. There is mild degenerative change of the right hip. Electronically Signed   By: David  Martinique M.D.   On: 08/11/2015 07:45    IMPRESSION: Principal Problem:   Chest pain with moderate risk of acute coronary syndrome Active Problems:   Essential hypertension   Diabetes mellitus, type 2 (HCC)   CKD stage 4, GFR 15-29 ml/min    Hypothyroidism   Anemia in chronic renal disease   Schizoaffective disorder (HCC)   Polymyalgia rheumatica (HCC)   History of TIA - May 2015- Plavix   MGUS (monoclonal gammopathy of unknown significance)   RECOMMENDATION: MD to see. Poor cath candidate with CRI.  Her EKG is normal and Troponin negative. Echo ordered, consider Lexiscan to r/o high risk ischemia, otherwise would probably treat medically. Decrease ASA to 81 mg (on Plavix).   Time Spent Directly with Patient: 45 minutes  Kerin Ransom, [place] beeper 08/11/2015, 11:48 AM

## 2015-08-11 NOTE — Progress Notes (Signed)
Patient admitted from ED with chest pain. Denies pain at this time. Family by the bed side. Patient oriented to room and surroundings. MD on call notified.

## 2015-08-12 ENCOUNTER — Observation Stay (HOSPITAL_COMMUNITY): Payer: Medicare Other

## 2015-08-12 ENCOUNTER — Observation Stay (HOSPITAL_BASED_OUTPATIENT_CLINIC_OR_DEPARTMENT_OTHER): Payer: Medicare Other

## 2015-08-12 DIAGNOSIS — M79609 Pain in unspecified limb: Secondary | ICD-10-CM | POA: Diagnosis not present

## 2015-08-12 DIAGNOSIS — Z8673 Personal history of transient ischemic attack (TIA), and cerebral infarction without residual deficits: Secondary | ICD-10-CM | POA: Diagnosis not present

## 2015-08-12 DIAGNOSIS — R079 Chest pain, unspecified: Secondary | ICD-10-CM | POA: Diagnosis not present

## 2015-08-12 DIAGNOSIS — N184 Chronic kidney disease, stage 4 (severe): Secondary | ICD-10-CM | POA: Diagnosis not present

## 2015-08-12 DIAGNOSIS — E038 Other specified hypothyroidism: Secondary | ICD-10-CM | POA: Diagnosis not present

## 2015-08-12 DIAGNOSIS — F259 Schizoaffective disorder, unspecified: Secondary | ICD-10-CM

## 2015-08-12 DIAGNOSIS — I25110 Atherosclerotic heart disease of native coronary artery with unstable angina pectoris: Secondary | ICD-10-CM | POA: Diagnosis not present

## 2015-08-12 DIAGNOSIS — I10 Essential (primary) hypertension: Secondary | ICD-10-CM | POA: Diagnosis not present

## 2015-08-12 LAB — CBC
HEMATOCRIT: 31 % — AB (ref 36.0–46.0)
Hemoglobin: 10.4 g/dL — ABNORMAL LOW (ref 12.0–15.0)
MCH: 32.3 pg (ref 26.0–34.0)
MCHC: 33.5 g/dL (ref 30.0–36.0)
MCV: 96.3 fL (ref 78.0–100.0)
PLATELETS: 206 10*3/uL (ref 150–400)
RBC: 3.22 MIL/uL — ABNORMAL LOW (ref 3.87–5.11)
RDW: 13.9 % (ref 11.5–15.5)
WBC: 14.7 10*3/uL — AB (ref 4.0–10.5)

## 2015-08-12 LAB — NM MYOCAR MULTI W/SPECT W/WALL MOTION / EF
CSEPED: 5 min
CSEPEW: 1 METS
CSEPPHR: 80 {beats}/min
Exercise duration (sec): 15 s
Rest HR: 67 {beats}/min

## 2015-08-12 LAB — BASIC METABOLIC PANEL
Anion gap: 12 (ref 5–15)
BUN: 54 mg/dL — ABNORMAL HIGH (ref 6–20)
CALCIUM: 9.4 mg/dL (ref 8.9–10.3)
CO2: 24 mmol/L (ref 22–32)
CREATININE: 1.86 mg/dL — AB (ref 0.44–1.00)
Chloride: 106 mmol/L (ref 101–111)
GFR, EST AFRICAN AMERICAN: 29 mL/min — AB (ref >60)
GFR, EST NON AFRICAN AMERICAN: 25 mL/min — AB (ref >60)
Glucose, Bld: 105 mg/dL — ABNORMAL HIGH (ref 65–99)
Potassium: 5.1 mmol/L (ref 3.5–5.1)
SODIUM: 142 mmol/L (ref 135–145)

## 2015-08-12 LAB — GLUCOSE, CAPILLARY
GLUCOSE-CAPILLARY: 95 mg/dL (ref 65–99)
Glucose-Capillary: 189 mg/dL — ABNORMAL HIGH (ref 65–99)
Glucose-Capillary: 154 mg/dL — ABNORMAL HIGH (ref 65–99)
Glucose-Capillary: 100 mg/dL — ABNORMAL HIGH (ref 65–99)

## 2015-08-12 LAB — TSH: TSH: 0.816 u[IU]/mL (ref 0.350–4.500)

## 2015-08-12 MED ORDER — REGADENOSON 0.4 MG/5ML IV SOLN
INTRAVENOUS | Status: AC
  Filled 2015-08-12: qty 5

## 2015-08-12 MED ORDER — VITAMIN C 500 MG PO TABS
250.0000 mg | ORAL_TABLET | Freq: Every day | ORAL | Status: DC
  Administered 2015-08-14: 250 mg via ORAL
  Filled 2015-08-12 (×2): qty 1

## 2015-08-12 MED ORDER — TECHNETIUM TC 99M SESTAMIBI GENERIC - CARDIOLITE
10.0000 | Freq: Once | INTRAVENOUS | Status: AC | PRN
  Administered 2015-08-12: 10 via INTRAVENOUS

## 2015-08-12 MED ORDER — SODIUM CHLORIDE 0.9 % WEIGHT BASED INFUSION
1.0000 mL/kg/h | INTRAVENOUS | Status: DC
  Administered 2015-08-12: 1 mL/kg/h via INTRAVENOUS

## 2015-08-12 MED ORDER — SODIUM CHLORIDE 0.9% FLUSH: 3.0000 mL | INTRAVENOUS | Status: DC | PRN

## 2015-08-12 MED ORDER — SODIUM CHLORIDE 0.9% FLUSH
3.0000 mL | Freq: Two times a day (BID) | INTRAVENOUS | Status: DC
  Administered 2015-08-12: 3 mL via INTRAVENOUS

## 2015-08-12 MED ORDER — ASPIRIN 81 MG PO CHEW
81.0000 mg | CHEWABLE_TABLET | ORAL | Status: AC
  Administered 2015-08-13: 81 mg via ORAL
  Filled 2015-08-12: qty 1

## 2015-08-12 MED ORDER — REGADENOSON 0.4 MG/5ML IV SOLN
0.4000 mg | Freq: Once | INTRAVENOUS | Status: AC
  Administered 2015-08-12: 0.4 mg via INTRAVENOUS
  Filled 2015-08-12: qty 5

## 2015-08-12 MED ORDER — IPRATROPIUM-ALBUTEROL 0.5-2.5 (3) MG/3ML IN SOLN
3.0000 mL | Freq: Three times a day (TID) | RESPIRATORY_TRACT | Status: AC
  Administered 2015-08-12 – 2015-08-13 (×2): 3 mL via RESPIRATORY_TRACT
  Filled 2015-08-12 (×3): qty 3

## 2015-08-12 MED ORDER — TECHNETIUM TC 99M SESTAMIBI GENERIC - CARDIOLITE
30.0000 | Freq: Once | INTRAVENOUS | Status: AC | PRN
  Administered 2015-08-12: 30 via INTRAVENOUS

## 2015-08-12 MED ORDER — SODIUM CHLORIDE 0.9 % IV SOLN: 250.0000 mL | INTRAVENOUS | Status: DC | PRN

## 2015-08-12 NOTE — Progress Notes (Addendum)
*  PRELIMINARY RESULTS* Vascular Ultrasound Lower extremity venous duplex has been completed.  Preliminary findings: Bilateral No evidence of DVT in visualized veins or baker's cyst. LLE venous negative from 08/06/15 also.   Landry Mellow, RDMS, RVT  08/12/2015, 10:54 AM

## 2015-08-12 NOTE — Progress Notes (Addendum)
   Nuclear study is high risk.  Plan coronary angiography tomorrow, tentative. The patient was counseled to undergo left heart catheterization, coronary angiography, and possible percutaneous coronary intervention with stent implantation. The procedural risks and benefits were discussed in detail. The risks discussed included death, stroke, myocardial infarction, life-threatening bleeding, limb ischemia, kidney injury, allergy, and possible emergency cardiac surgery. The risk of these significant complications were estimated to occur less than 1% of the time. After discussion, the patient has agreed to proceed. Discussed at length with daughter during phone conversation.

## 2015-08-12 NOTE — Progress Notes (Signed)
PROGRESS NOTE  Rebecca [name] [id number] DOB: 07-02-1940 DOA: 08/10/2015 PCP: [name], MD  HPI/Recap of past 24 hours:  Denies pain, very pleasant, daughter in room reported ms Mello's left lower leg edema has much improved, but daughter concerned that patient seems to cough more today.  Assessment/Plan: Principal Problem:   Chest pain with moderate risk of acute coronary syndrome Active Problems:   Hypothyroidism   Anemia in chronic renal disease   Schizoaffective disorder (HCC)   Essential hypertension   Polymyalgia rheumatica (HCC)   Diabetes mellitus, type 2 (HCC)   History of TIA - May 2015- Plavix   MGUS (monoclonal gammopathy of unknown significance)   CKD stage 4, GFR 15-29 ml/min   1. Chest pain - given history of CAD status post stenting concerning for angina. Chest pain-free at this time. Continue with Plavix/statin/betablockes. cardiac markers negative x3. ekg no acute changes. Echo /stress test result noted, cath on 3/1, appreciate Cardiology input.         2. Polymyalgia rheumatica on prednisone with chronic leukocytosis, patient is currently on prednisone 14mg  po qd, she has been working with her rheumatology to taper prednisone to 7-8mg  qd (the dose she has been on for many years)  3. MGUS being followed by oncologist. Cbc and renal function at baseline  4. COPD - not wheezing. But reported more cough on 2/28, initial cxr on admission unremarkable, trial of nebs. Consider repeat cxr if symptom get worse.  5. noninsulin dependent Diabetes mellitus on diet - likely chronic steroid induced. on sliding scale in the hospital.     6. Increased bruising and left lower extremity swelling - as per the patient's daughter patient's left lower extremity swelling has worsened recently. Patient has had a Doppler last week which was negative but patient's daughter states the swelling as needed further worsened.  recheck Doppler again no DVT, edema has much improved.  7.  Right hip pain - x-ray of the pelvis: No lytic or blastic bony lesion is observed. There is mild degenerative change of the right hip.  Code Status: full  Family Communication: patient  and her daughter in room  Disposition Plan: cath on 3/1   Consultants:  cardiology  Procedures:  Stress test 2/28  Cardiac cath 3/1  Antibiotics:  none   Objective: BP 139/70 mmHg  Pulse 74  Temp(Src) 97.5 F (36.4 C) (Oral)  Resp 18  Ht 5\' 4"  (1.626 m)  Wt 71.895 kg (158 lb 8 oz)  BMI 27.19 kg/m2  SpO2 100%  Intake/Output Summary (Last 24 hours) at 08/12/15 2111 Last data filed at 08/12/15 1700  Gross per 24 hour  Intake    760 ml  Output      0 ml  Net    760 ml   Filed Weights   08/11/15 0023  Weight: 71.895 kg (158 lb 8 oz)    Exam:   General:  NAD, pleasant  Cardiovascular: RRR  Respiratory: mild rhonchi vs upper airway sounds  Abdomen: Soft/ND/NT, positive BS  Musculoskeletal: left lower extremity Edema has almost resolved  Skin: scattered ecchymosis extensor surface on bilateral forearm (chronic)  Neuro: aaox3, mild baseline memory impairment due to prior CVA per daughter.  Data Reviewed: Basic Metabolic Panel:  Recent Labs Lab 08/10/15 2045 08/11/15 0236 08/12/15 0638  NA 138  --  142  K 4.8  --  5.1  CL 106  --  106  CO2 23  --  24  GLUCOSE 133*  --  105*  BUN 52*  --  54*  CREATININE 1.94* 1.97* 1.86*  CALCIUM 9.5  --  9.4   Liver Function Tests: No results for input(s): AST, ALT, ALKPHOS, BILITOT, PROT, ALBUMIN in the last 168 hours. No results for input(s): LIPASE, AMYLASE in the last 168 hours. No results for input(s): AMMONIA in the last 168 hours. CBC:  Recent Labs Lab 08/10/15 2045 08/11/15 0236 08/12/15 0638  WBC 22.8* 20.3* 14.7*  NEUTROABS 21.3*  --   --   HGB 10.4* 9.6* 10.4*  HCT 31.8* 29.9* 31.0*  MCV 95.8 95.8 96.3  PLT 223 208 206   Cardiac Enzymes:    Recent Labs Lab 08/11/15 0236 08/11/15 0832  08/11/15 1455  TROPONINI <0.03 <0.03 <0.03   BNP (last 3 results)  Recent Labs  08/10/15 2045  BNP 91.0    ProBNP (last 3 results) No results for input(s): PROBNP in the last 8760 hours.  CBG:  Recent Labs Lab 08/11/15 1657 08/11/15 2137 08/12/15 0644 08/12/15 1113 08/12/15 1704  GLUCAP 205* 115* 95 189* 154*    No results found for this or any previous visit (from the past 240 hour(s)).   Studies: Nm Myocar Multi W/spect W/wall Motion / Ef  08/12/2015  CLINICAL DATA:  Chest pain, history of angioplasty and stent placement. Diabetes hypertension, shortness of breath and coronary artery disease. EXAM: MYOCARDIAL IMAGING WITH SPECT (PHARMACOLOGIC-STRESS) GATED LEFT VENTRICULAR WALL MOTION STUDY LEFT VENTRICULAR EJECTION FRACTION TECHNIQUE: Intravenous infusion of Lexiscan was performed under the supervision of the Cardiology staff. At peak effect of the drug, 10 mCi Tc-55m sestamibi was injected intravenously and standard myocardial SPECT imaging was performed. Quantitative gated imaging was also performed to evaluate left ventricular wall motion, and estimate left ventricular ejection fraction. COMPARISON:  None. FINDINGS: Perfusion: Examination is abnormal. On the stress images there is decreased uptake within the mid and apical segments of the anterior wall as well as the mid and apical segments of the lateral wall. There is improved uptake within these areas on the rest images. Wall Motion: Normal left ventricular wall motion. No left ventricular dilation. Left Ventricular Ejection Fraction: 68 % End diastolic volume 59 ml End systolic volume 19 ml IMPRESSION: 1. Examination is abnormal. Reversibility is identified in both the anterior and lateral walls. 2. Normal left ventricular wall motion. 3. Left ventricular ejection fraction 68% 4. High-risk stress test findings*. *2012 Appropriate Use Criteria for Coronary Revascularization Focused Update: J Am Coll Cardiol.  [id number]. http://content.airportbarriers.com.aspx?articleid=1201161 These results were called by telephone at the time of interpretation on 08/12/2015 at 12:56 pm to Dr. Daneen Schick , who verbally acknowledged these results. Electronically Signed   By: Kerby Moors M.D.   On: 08/12/2015 12:56    Scheduled Meds: . aspirin  81 mg Oral Daily  . [START ON 08/13/2015] aspirin  81 mg Oral Pre-Cath  . clopidogrel  75 mg Oral Q breakfast  . clozapine  300 mg Oral QHS  . diltiazem  180 mg Oral Q breakfast  . docusate sodium  200 mg Oral QHS  . enoxaparin (LOVENOX) injection  30 mg Subcutaneous Q24H  . ferrous sulfate  325 mg Oral BID WC  . insulin aspart  0-9 Units Subcutaneous TID WC  . ipratropium-albuterol  3 mL Nebulization 3 times per day  . isosorbide mononitrate  60 mg Oral Q breakfast  . levothyroxine  75 mcg Oral QAC breakfast  . loratadine  10 mg Oral Daily  . metoprolol succinate  100 mg Oral  Q breakfast  . mometasone-formoterol  2 puff Inhalation BID  . multivitamin with minerals  1 tablet Oral QHS  . omega-3 acid ethyl esters  1 g Oral Daily  . pantoprazole  40 mg Oral QAC breakfast  . polyethylene glycol  17 g Oral Q1400  . pravastatin  20 mg Oral QHS  . predniSONE  14 mg Oral Q breakfast  . sodium chloride flush  3 mL Intravenous Q12H  . tiotropium  18 mcg Inhalation Daily  . traMADol  100 mg Oral 4 times per day  . [START ON 08/15/2015] Vitamin D (Ergocalciferol)  50,000 Units Oral Q7 days    Continuous Infusions: . sodium chloride       Time spent: 9mins  Jamye Balicki MD, PhD  [facility] Pager [phone]. If 7PM-7AM, please contact night-coverage at [email or web address], password [facility] 08/12/2015, 9:11 PM

## 2015-08-12 NOTE — Progress Notes (Signed)
Patient Name: Rebecca [name] Date of Encounter: 08/12/2015   SUBJECTIVE  Feeling well. No chest pain, sob or palpitations. Seen in nuc med.   CURRENT MEDS . aspirin  81 mg Oral Daily  . clopidogrel  75 mg Oral Q breakfast  . clozapine  300 mg Oral QHS  . diltiazem  180 mg Oral Q breakfast  . docusate sodium  200 mg Oral QHS  . enoxaparin (LOVENOX) injection  30 mg Subcutaneous Q24H  . ferrous sulfate  325 mg Oral BID WC  . insulin aspart  0-9 Units Subcutaneous TID WC  . isosorbide mononitrate  60 mg Oral Q breakfast  . levothyroxine  75 mcg Oral QAC breakfast  . loratadine  10 mg Oral Daily  . metoprolol succinate  100 mg Oral Q breakfast  . mometasone-formoterol  2 puff Inhalation BID  . multivitamin with minerals  1 tablet Oral QHS  . omega-3 acid ethyl esters  1 g Oral Daily  . pantoprazole  40 mg Oral QAC breakfast  . polyethylene glycol  17 g Oral Q1400  . pravastatin  20 mg Oral QHS  . predniSONE  14 mg Oral Q breakfast  . regadenoson      . tiotropium  18 mcg Inhalation Daily  . traMADol  100 mg Oral 4 times per day  . [START ON 08/15/2015] Vitamin D (Ergocalciferol)  50,000 Units Oral Q7 days    OBJECTIVE  Filed Vitals:   08/12/15 0850 08/12/15 0901 08/12/15 0903 08/12/15 0905  BP: 132/70 140/71 115/72 115/64  Pulse: 67 75 84 79  Temp:      TempSrc:      Resp:      Height:      Weight:      SpO2:        Intake/Output Summary (Last 24 hours) at 08/12/15 0906 Last data filed at 08/11/15 2138  Gross per 24 hour  Intake    520 ml  Output      0 ml  Net    520 ml   Filed Weights   08/11/15 0023  Weight: 158 lb 8 oz (71.895 kg)    PHYSICAL EXAM  General: Pleasant, NAD. Neuro: Alert and oriented X 3. Moves all extremities spontaneously. Psych: Normal affect. HEENT:  Normal  Neck: Supple without bruits or JVD. Lungs:  Resp regular and unlabored, CTA. Heart: RRR no s3, s4, or murmurs. Abdomen: Soft, non-tender, non-distended, BS + x 4.    Extremities: No clubbing, cyanosis or edema. DP/PT/Radials 2+ and equal bilaterally.  Accessory Clinical Findings  CBC  Recent Labs  08/10/15 2045 08/11/15 0236 08/12/15 0638  WBC 22.8* 20.3* 14.7*  NEUTROABS 21.3*  --   --   HGB 10.4* 9.6* 10.4*  HCT 31.8* 29.9* 31.0*  MCV 95.8 95.8 96.3  PLT 223 208 [id number]   Basic Metabolic Panel  Recent Labs  08/10/15 2045 08/11/15 0236 08/12/15 0638  NA 138  --  142  K 4.8  --  5.1  CL 106  --  106  CO2 23  --  24  GLUCOSE 133*  --  105*  BUN 52*  --  54*  CREATININE 1.94* 1.97* 1.86*  CALCIUM 9.5  --  9.4   Liver Function Tests No results for input(s): AST, ALT, ALKPHOS, BILITOT, PROT, ALBUMIN in the last 72 hours. No results for input(s): LIPASE, AMYLASE in the last 72 hours. Cardiac Enzymes  Recent Labs  08/11/15 0236 08/11/15 [id number] 08/11/15 1455  TROPONINI <0.03 <0.03 <0.03    Thyroid Function Tests  Recent Labs  08/12/15 0638  TSH 0.816    TELE  Seen in nuc med.   Radiology/Studies  Dg Chest 2 View  08/10/2015  CLINICAL DATA:  Chest pain for 1 day EXAM: CHEST  2 VIEW COMPARISON:  10/16/2013 FINDINGS: Cardiac shadow is mildly enlarged but stable. The lungs are well aerated bilaterally. Chronic blunting of the left costophrenic angle is seen. No acute infiltrate is noted. No acute bony abnormality is seen. IMPRESSION: No active cardiopulmonary disease. Electronically Signed   By: Inez Catalina M.D.   On: 08/10/2015 21:05   Dg Pelvis Portable  08/11/2015  CLINICAL DATA:  Right hip pain, monoclonal gammopathy, polymyalgia rheumatica. EXAM: PORTABLE PELVIS 1-2 VIEWS COMPARISON:  AP pelvis of September 26, 2012. FINDINGS: Bowel gas limits evaluation of portions of the sacrum and iliac bones. The bony pelvis is adequately mineralized. There is no lytic or blastic lesion. There is no acute fracture nor dislocation. The sacrum and SI joints are normal. The right hip exhibits mild degenerative joint space loss. IMPRESSION: No  lytic or blastic bony lesion is observed. There is mild degenerative change of the right hip. Electronically Signed   By: David  Martinique M.D.   On: 08/11/2015 07:45    Echo LV EF: 60% -  65%  ------------------------------------------------------------------- Indications:   Chest pain 786.51.  ------------------------------------------------------------------- History:  PMH:  Coronary artery disease. Chronic obstructive pulmonary disease. PMH:  Stroke. Risk factors: Diabetes mellitus.  ------------------------------------------------------------------- Study Conclusions  - Left ventricle: The cavity size was normal. There was mild focal basal hypertrophy of the septum. Systolic function was normal. The estimated ejection fraction was in the range of 60% to 65%. Wall motion was normal; there were no regional wall motion abnormalities. - Left atrium: The atrium was mildly dilated. - Atrial septum: No defect or patent foramen ovale was identified  ASSESSMENT AND PLAN Principal Problem:   Chest pain with moderate risk of acute coronary syndrome Active Problems:   Hypothyroidism   Anemia in chronic renal disease   Schizoaffective disorder (HCC)   Essential hypertension   Polymyalgia rheumatica (HCC)   Diabetes mellitus, type 2 (HCC)   History of TIA - May 2015- Plavix   MGUS (monoclonal gammopathy of unknown significance)   CKD stage 4, GFR 15-29 ml/min     Plan: Troponin negative. EKG non ischemic. Echo showed LV ef of 60-65%, mild focal basal hypertrophy of septum, no WM abnormality, mild dilated LA. Pending nuc result. Likely medical management.   Signed, Leanor Kail PA-C Pager [phone]  The patient has been seen in conjunction with B. Bhagat, PAC. All aspects of care have been considered and discussed. The patient has been personally interviewed, examined, and all clinical data has been reviewed.   MI ruled out and symptoms are very  atypical.  No further cardiac w/u will be necessary assuming no high risk findings on nuclear study.

## 2015-08-13 ENCOUNTER — Encounter (HOSPITAL_COMMUNITY): Payer: Self-pay | Admitting: Interventional Cardiology

## 2015-08-13 ENCOUNTER — Encounter (HOSPITAL_COMMUNITY)
Admission: EM | Disposition: A | Discharge status: Home / Self Care | Payer: Self-pay | Source: Home / Self Care | Attending: Emergency Medicine

## 2015-08-13 DIAGNOSIS — D472 Monoclonal gammopathy: Secondary | ICD-10-CM

## 2015-08-13 DIAGNOSIS — I10 Essential (primary) hypertension: Secondary | ICD-10-CM | POA: Diagnosis not present

## 2015-08-13 DIAGNOSIS — E039 Hypothyroidism, unspecified: Secondary | ICD-10-CM | POA: Diagnosis not present

## 2015-08-13 DIAGNOSIS — M353 Polymyalgia rheumatica: Secondary | ICD-10-CM

## 2015-08-13 DIAGNOSIS — Z8673 Personal history of transient ischemic attack (TIA), and cerebral infarction without residual deficits: Secondary | ICD-10-CM

## 2015-08-13 DIAGNOSIS — M25551 Pain in right hip: Secondary | ICD-10-CM

## 2015-08-13 DIAGNOSIS — R079 Chest pain, unspecified: Secondary | ICD-10-CM | POA: Diagnosis not present

## 2015-08-13 DIAGNOSIS — F250 Schizoaffective disorder, bipolar type: Secondary | ICD-10-CM

## 2015-08-13 DIAGNOSIS — I25110 Atherosclerotic heart disease of native coronary artery with unstable angina pectoris: Secondary | ICD-10-CM | POA: Diagnosis not present

## 2015-08-13 HISTORY — PX: CARDIAC CATHETERIZATION: SHX172

## 2015-08-13 LAB — CBC
HCT: 28.4 % — ABNORMAL LOW (ref 36.0–46.0)
HEMOGLOBIN: 9.2 g/dL — AB (ref 12.0–15.0)
MCH: 31 pg (ref 26.0–34.0)
MCHC: 32.4 g/dL (ref 30.0–36.0)
MCV: 95.6 fL (ref 78.0–100.0)
PLATELETS: 199 10*3/uL (ref 150–400)
RBC: 2.97 MIL/uL — AB (ref 3.87–5.11)
RDW: 13.7 % (ref 11.5–15.5)
WBC: 14.3 10*3/uL — AB (ref 4.0–10.5)

## 2015-08-13 LAB — BASIC METABOLIC PANEL
ANION GAP: 8 (ref 5–15)
BUN: 46 mg/dL — ABNORMAL HIGH (ref 6–20)
CHLORIDE: 104 mmol/L (ref 101–111)
CO2: 25 mmol/L (ref 22–32)
Calcium: 8.7 mg/dL — ABNORMAL LOW (ref 8.9–10.3)
Creatinine, Ser: 1.57 mg/dL — ABNORMAL HIGH (ref 0.44–1.00)
GFR calc Af Amer: 36 mL/min — ABNORMAL LOW (ref >60)
GFR, EST NON AFRICAN AMERICAN: 31 mL/min — AB (ref >60)
GLUCOSE: 92 mg/dL (ref 65–99)
Potassium: 4.5 mmol/L (ref 3.5–5.1)
SODIUM: 137 mmol/L (ref 135–145)

## 2015-08-13 LAB — GLUCOSE, CAPILLARY
GLUCOSE-CAPILLARY: 96 mg/dL (ref 65–99)
GLUCOSE-CAPILLARY: 109 mg/dL — AB (ref 65–99)
Glucose-Capillary: 114 mg/dL — ABNORMAL HIGH (ref 65–99)
Glucose-Capillary: 165 mg/dL — ABNORMAL HIGH (ref 65–99)

## 2015-08-13 LAB — URINALYSIS, ROUTINE W REFLEX MICROSCOPIC
BILIRUBIN URINE: NEGATIVE
Glucose, UA: NEGATIVE mg/dL
HGB URINE DIPSTICK: NEGATIVE
Ketones, ur: NEGATIVE mg/dL
NITRITE: POSITIVE — AB
PROTEIN: NEGATIVE mg/dL
Specific Gravity, Urine: 1.01 (ref 1.005–1.030)
pH: 5.5 (ref 5.0–8.0)

## 2015-08-13 LAB — URINE MICROSCOPIC-ADD ON

## 2015-08-13 LAB — PROTIME-INR
INR: 0.96 (ref 0.00–1.49)
PROTHROMBIN TIME: 13 s (ref 11.6–15.2)

## 2015-08-13 SURGERY — LEFT HEART CATH AND CORONARY ANGIOGRAPHY
Anesthesia: LOCAL

## 2015-08-13 MED ORDER — LIDOCAINE HCL (PF) 1 % IJ SOLN
INTRAMUSCULAR | Status: AC
  Filled 2015-08-13: qty 30

## 2015-08-13 MED ORDER — SODIUM CHLORIDE 0.9 % IV SOLN: 250.0000 mL | INTRAVENOUS | Status: DC | PRN

## 2015-08-13 MED ORDER — IOHEXOL 350 MG/ML SOLN
INTRAVENOUS | Status: DC | PRN
  Administered 2015-08-13: 25 mL via INTRA_ARTERIAL

## 2015-08-13 MED ORDER — FENTANYL CITRATE (PF) 100 MCG/2ML IJ SOLN
INTRAMUSCULAR | Status: AC
  Filled 2015-08-13: qty 2

## 2015-08-13 MED ORDER — HEPARIN SODIUM (PORCINE) 1000 UNIT/ML IJ SOLN
INTRAMUSCULAR | Status: DC | PRN
  Administered 2015-08-13: 3500 [IU] via INTRAVENOUS

## 2015-08-13 MED ORDER — HEPARIN (PORCINE) IN NACL 2-0.9 UNIT/ML-% IJ SOLN
INTRAMUSCULAR | Status: AC
  Filled 2015-08-13: qty 500

## 2015-08-13 MED ORDER — ACETAMINOPHEN 325 MG PO TABS: 650.0000 mg | ORAL_TABLET | ORAL | Status: DC | PRN

## 2015-08-13 MED ORDER — HEPARIN SODIUM (PORCINE) 1000 UNIT/ML IJ SOLN
INTRAMUSCULAR | Status: AC
  Filled 2015-08-13: qty 1

## 2015-08-13 MED ORDER — ENOXAPARIN SODIUM 30 MG/0.3ML ~~LOC~~ SOLN
30.0000 mg | SUBCUTANEOUS | Status: DC
  Administered 2015-08-14: 30 mg via SUBCUTANEOUS
  Filled 2015-08-13: qty 0.3

## 2015-08-13 MED ORDER — SODIUM CHLORIDE 0.9% FLUSH
3.0000 mL | Freq: Two times a day (BID) | INTRAVENOUS | Status: DC
Start: 2015-08-13 — End: 2015-08-14
  Administered 2015-08-13 – 2015-08-14 (×2): 3 mL via INTRAVENOUS

## 2015-08-13 MED ORDER — SODIUM CHLORIDE 0.9 % IV SOLN
INTRAVENOUS | Status: AC
Start: 2015-08-13 — End: 2015-08-13
  Administered 2015-08-13: 18:00:00 via INTRAVENOUS

## 2015-08-13 MED ORDER — LIDOCAINE HCL (PF) 1 % IJ SOLN
INTRAMUSCULAR | Status: DC | PRN
  Administered 2015-08-13: 15:00:00

## 2015-08-13 MED ORDER — MIDAZOLAM HCL 2 MG/2ML IJ SOLN
INTRAMUSCULAR | Status: AC
Start: 2015-08-13 — End: 2015-08-13
  Filled 2015-08-13: qty 2

## 2015-08-13 MED ORDER — DEXTROSE 5 % IV SOLN
1.0000 g | INTRAVENOUS | Status: DC
  Administered 2015-08-13: 1 g via INTRAVENOUS
  Filled 2015-08-13 (×2): qty 10

## 2015-08-13 MED ORDER — VERAPAMIL HCL 2.5 MG/ML IV SOLN
INTRAVENOUS | Status: AC
  Filled 2015-08-13: qty 2

## 2015-08-13 MED ORDER — SODIUM CHLORIDE 0.9% FLUSH: 3.0000 mL | INTRAVENOUS | Status: DC | PRN

## 2015-08-13 MED ORDER — FENTANYL CITRATE (PF) 100 MCG/2ML IJ SOLN
INTRAMUSCULAR | Status: DC | PRN
  Administered 2015-08-13: 25 ug via INTRAVENOUS

## 2015-08-13 MED ORDER — VERAPAMIL HCL 2.5 MG/ML IV SOLN
INTRAVENOUS | Status: DC | PRN
  Administered 2015-08-13: 15:00:00 via INTRA_ARTERIAL

## 2015-08-13 MED ORDER — ONDANSETRON HCL 4 MG/2ML IJ SOLN: 4.0000 mg | Freq: Four times a day (QID) | INTRAMUSCULAR | Status: DC | PRN

## 2015-08-13 MED ORDER — SODIUM CHLORIDE 0.9 % IV SOLN
INTRAVENOUS | Status: DC
  Administered 2015-08-13: 09:00:00 via INTRAVENOUS

## 2015-08-13 MED ORDER — MIDAZOLAM HCL 2 MG/2ML IJ SOLN
INTRAMUSCULAR | Status: DC | PRN
  Administered 2015-08-13: 1 mg via INTRAVENOUS

## 2015-08-13 SURGICAL SUPPLY — 9 items
CATH INFINITI 5 FR JL3.5 (CATHETERS) ×2 IMPLANT
CATH INFINITI JR4 5F (CATHETERS) ×2 IMPLANT
DEVICE RAD COMP TR BAND LRG (VASCULAR PRODUCTS) ×2 IMPLANT
GLIDESHEATH SLEND SS 6F .021 (SHEATH) ×2 IMPLANT
KIT HEART LEFT (KITS) ×2 IMPLANT
PACK CARDIAC CATHETERIZATION (CUSTOM PROCEDURE TRAY) ×2 IMPLANT
TRANSDUCER W/STOPCOCK (MISCELLANEOUS) ×2 IMPLANT
TUBING CIL FLEX 10 FLL-RA (TUBING) ×2 IMPLANT
WIRE SAFE-T 1.5MM-J .035X260CM (WIRE) ×2 IMPLANT

## 2015-08-13 NOTE — Progress Notes (Signed)
       Patient Name: Rebecca [name] Date of Encounter: 08/13/2015    SUBJECTIVE: Very vague history from the patient. Had a long conversation with the daughter yesterday who was convinced that the patient was having chest pain responsive to nitroglycerin. Much more nitroglycerin has been used recently for recurring episodes.  TELEMETRY:  Normal sinus rhythm Filed Vitals:   08/12/15 1306 08/12/15 2033 08/12/15 2208 08/13/15 0350  BP: 139/70  156/58 148/61  Pulse: 74  69 66  Temp: 97.5 F (36.4 C)  98.2 F (36.8 C) 97.8 F (36.6 C)  TempSrc: Oral  Oral Oral  Resp: 18  18 18   Height:      Weight:      SpO2: 100% 100% 98% 98%    Intake/Output Summary (Last 24 hours) at 08/13/15 0835 Last data filed at 08/12/15 2100  Gross per 24 hour  Intake    720 ml  Output      0 ml  Net    720 ml   LABS: Basic Metabolic Panel:  Recent Labs  08/12/15 0638 08/13/15 0311  NA 142 137  K 5.1 4.5  CL 106 104  CO2 24 25  GLUCOSE 105* 92  BUN 54* 46*  CREATININE 1.86* 1.57*  CALCIUM 9.4 8.7*   CBC:  Recent Labs  08/10/15 2045  08/12/15 0638 08/13/15 0311  WBC 22.8*  < > 14.7* 14.3*  NEUTROABS 21.3*  --   --   --   HGB 10.4*  < > 10.4* 9.2*  HCT 31.8*  < > 31.0* 28.4*  MCV 95.8  < > 96.3 95.6  PLT 223  < > 206 199  < > = values in this interval not displayed. Cardiac Enzymes:  Recent Labs  08/11/15 0236 08/11/15 0832 08/11/15 1455  TROPONINI <0.03 <0.03 <0.03   BNP    Component Value Date/Time   BNP 91.0 08/10/2015 2045    ProBNP    Component Value Date/Time   PROBNP 493.1* 07/13/2012 1755    Radiology/Studies:  No acute abnormality is noted on chest x-ray.  Myocardial Perfusion: High risk study with evidence of anterior wall and inferolateral wall ischemia.  ECHOCARDIOGRAM: 08/11/15 reveals normal LV function with mild left atrial enlargement.  Physical Exam: Blood pressure 148/61, pulse 66, temperature 97.8 F (36.6 C), temperature source Oral, resp.  rate 18, height 5\' 4"  (1.626 m), weight 158 lb 8 oz (71.895 kg), SpO2 98 %. Weight change:   Wt Readings from Last 3 Encounters:  08/11/15 158 lb 8 oz (71.895 kg)  01/07/15 158 lb 12.8 oz (72.031 kg)  01/06/15 158 lb (71.668 kg)    No significant JVD is noted Chest is clear Cardiac exam no murmur or gallop Extremities reveal no edema  ASSESSMENT:  1. High-risk myocardial perfusion study reason the possibility of left main equivalent anatomy versus false positive study 2. CK, stage III-IV 3. Schizoaffective disorder and memory loss.  Plan:  1. Aggressive hydration precath today 2. Again, long conversation with daughter concerning the risks and potential benefits. With high risk study and potential left main equivalent, anatomy needs to be revealed. 3. LV function is known to be normal. Left trigger log refused not needed. 4. If PCI as needed, consider staged procedure to decrease exposure to contrast but will leave to the discretion of the operator.  Demetrios Isaacs 08/13/2015, 8:35 AM

## 2015-08-13 NOTE — Interval H&P Note (Signed)
Cath Lab Visit (complete for each Cath Lab visit)  Clinical Evaluation Leading to the Procedure:   ACS: Yes.    Non-ACS:    Anginal Classification: CCS IV  Anti-ischemic medical therapy: Minimal Therapy (1 class of medications)  Non-Invasive Test Results: No non-invasive testing performed  Prior CABG: No previous CABG  Prior Taxus stent to the RCA.    History and Physical Interval Note:  08/13/2015 2:37 PM  Rebecca [name]  has presented today for surgery, with the diagnosis of c/p  The various methods of treatment have been discussed with the patient and family. After consideration of risks, benefits and other options for treatment, the patient has consented to  Procedure(s): Left Heart Cath and Coronary Angiography (N/A) as a surgical intervention .  The patient's history has been reviewed, patient examined, no change in status, stable for surgery.  I have reviewed the patient's chart and labs.  Questions were answered to the patient's satisfaction.     VARANASI,JAYADEEP S.

## 2015-08-13 NOTE — Progress Notes (Signed)
   No evidence of significant obstruction or stent narrowing. Chest pain was probably non-ischemic  Would hydrate over night and discharge in AM.

## 2015-08-13 NOTE — Progress Notes (Signed)
[facility] PROGRESS NOTE  Rebecca [name] [id number] DOB: Nov 10, 1940 DOA: 08/10/2015 PCP: [name], MD  Assessment/Plan: #1 chest pain Patient with history of coronary artery disease status post stent. Concern for acute coronary syndrome. Patient currently chest pain-free. Cardiac enzymes negative 3. EKG with no ischemic changes noted. 2-D echo with EF of 60-65% with no wall motion abnormalities. Myocardial perfusion stress test with a high risk study with evidence of anterior wall and inferior wall ischemia. Patient subsequently underwent cardiac catheterization today which showed no significant obstructive disease. Continue Plavix, diltiazem,imdur, Toprol-XL,lovaza, Pravachol, PPI. Per cardiology.  #2 UTI Urine cultures pending. IV Rocephin.  #3 COPD Stable. Chest x-ray negative. Continue Claritin, PPI, Spiriva.  #4 polymyalgia rheumatica on prednisone with chronic leukocytosis Continue prednisone 40 mg daily. Patient has been coordinating with a rheumatologist to taper her prednisone to 7-8 mg daily. Outpatient follow-up.  #5 MGUS Follow-up with oncology and outpatient setting.  #6 non-insulin-dependent diabetes mellitus Likely secondary to chronic steroid therapy. Sliding scale insulin.  #7 left lower extremity swelling and increased bruising Patient had lower extremity Dopplers done one week prior to admission which was negative. None had stated that patient's swelling had worsened. Repeat Doppler negative for DVT. Swelling improved.  #8 right hip pain X-rays of the pelvis negative. X-ray showing mild degenerative change. Outpatient follow-up.  Code Status: Full Family Communication: Updated patient and family at bedside. Disposition Plan: Hopefully home tomorrow after gentle hydration. Per cardiology.   Consultants:  Cardiology: Dr. Daneen Schick 08/12/2015  Procedures:  Stress test  08/12/2015  Cardiac catheterization 08/13/2015  Antibiotics:  IV  Rocephin 08/13/2015  HPI/Subjective: Patient sitting up in bed eating. No chest pain. No shortness of breath.  Objective: Filed Vitals:   08/13/15 1916 08/13/15 1943  BP: 126/62 136/65  Pulse: 67 67  Temp:  98.3 F (36.8 C)  Resp:  20    Intake/Output Summary (Last 24 hours) at 08/13/15 2115 Last data filed at 08/13/15 1934  Gross per 24 hour  Intake   1080 ml  Output   1150 ml  Net    -70 ml   Filed Weights   08/11/15 0023  Weight: 71.895 kg (158 lb 8 oz)    Exam:   General:  NAD  Cardiovascular: RRR  Respiratory: CTAB  Abdomen: Soft, nontender, nondistended, positive bowel sounds.  Musculoskeletal: No clubbing cyanosis or edema.  Data Reviewed: Basic Metabolic Panel:  Recent Labs Lab 08/10/15 2045 08/11/15 0236 08/12/15 [id number] 08/13/15 0311  NA 138  --  142 137  K 4.8  --  5.1 4.5  CL 106  --  106 104  CO2 23  --  24 25  GLUCOSE 133*  --  105* 92  BUN 52*  --  54* 46*  CREATININE 1.94* 1.97* 1.86* 1.57*  CALCIUM 9.5  --  9.4 8.7*   Liver Function Tests: No results for input(s): AST, ALT, ALKPHOS, BILITOT, PROT, ALBUMIN in the last 168 hours. No results for input(s): LIPASE, AMYLASE in the last 168 hours. No results for input(s): AMMONIA in the last 168 hours. CBC:  Recent Labs Lab 08/10/15 2045 08/11/15 0236 08/12/15 0638 08/13/15 0311  WBC 22.8* 20.3* 14.7* 14.3*  NEUTROABS 21.3*  --   --   --   HGB 10.4* 9.6* 10.4* 9.2*  HCT 31.8* 29.9* 31.0* 28.4*  MCV 95.8 95.8 96.3 95.6  PLT 223 208 206 199   Cardiac Enzymes:  Recent Labs Lab 08/11/15 0236 08/11/15 0832 08/11/15 1455  TROPONINI <  0.03 <0.03 <0.03   BNP (last 3 results)  Recent Labs  08/10/15 2045  BNP 91.0    ProBNP (last 3 results) No results for input(s): PROBNP in the last 8760 hours.  CBG:  Recent Labs Lab 08/12/15 1704 08/12/15 2135 08/13/15 0556 08/13/15 1118 08/13/15 1649  GLUCAP 154* 100* 96 109* 114*    No results found for this or any previous  visit (from the past 240 hour(s)).   Studies: Nm Myocar Multi W/spect W/wall Motion / Ef  08/12/2015  CLINICAL DATA:  Chest pain, history of angioplasty and stent placement. Diabetes hypertension, shortness of breath and coronary artery disease. EXAM: MYOCARDIAL IMAGING WITH SPECT (PHARMACOLOGIC-STRESS) GATED LEFT VENTRICULAR WALL MOTION STUDY LEFT VENTRICULAR EJECTION FRACTION TECHNIQUE: Intravenous infusion of Lexiscan was performed under the supervision of the Cardiology staff. At peak effect of the drug, 10 mCi Tc-65m sestamibi was injected intravenously and standard myocardial SPECT imaging was performed. Quantitative gated imaging was also performed to evaluate left ventricular wall motion, and estimate left ventricular ejection fraction. COMPARISON:  None. FINDINGS: Perfusion: Examination is abnormal. On the stress images there is decreased uptake within the mid and apical segments of the anterior wall as well as the mid and apical segments of the lateral wall. There is improved uptake within these areas on the rest images. Wall Motion: Normal left ventricular wall motion. No left ventricular dilation. Left Ventricular Ejection Fraction: 68 % End diastolic volume 59 ml End systolic volume 19 ml IMPRESSION: 1. Examination is abnormal. Reversibility is identified in both the anterior and lateral walls. 2. Normal left ventricular wall motion. 3. Left ventricular ejection fraction 68% 4. High-risk stress test findings*. *2012 Appropriate Use Criteria for Coronary Revascularization Focused Update: J Am Coll Cardiol. N6492421. http://content.airportbarriers.com.aspx?articleid=1201161 These results were called by telephone at the time of interpretation on 08/12/2015 at 12:56 pm to Dr. Daneen Schick , who verbally acknowledged these results. Electronically Signed   By: Kerby Moors M.D.   On: 08/12/2015 12:56    Scheduled Meds: . aspirin  81 mg Oral Daily  . cefTRIAXone (ROCEPHIN)  IV  1 g  Intravenous Q24H  . clopidogrel  75 mg Oral Q breakfast  . clozapine  300 mg Oral QHS  . diltiazem  180 mg Oral Q breakfast  . docusate sodium  200 mg Oral QHS  . [START ON 08/14/2015] enoxaparin (LOVENOX) injection  30 mg Subcutaneous Q24H  . ferrous sulfate  325 mg Oral BID WC  . insulin aspart  0-9 Units Subcutaneous TID WC  . isosorbide mononitrate  60 mg Oral Q breakfast  . levothyroxine  75 mcg Oral QAC breakfast  . loratadine  10 mg Oral Daily  . metoprolol succinate  100 mg Oral Q breakfast  . mometasone-formoterol  2 puff Inhalation BID  . multivitamin with minerals  1 tablet Oral QHS  . omega-3 acid ethyl esters  1 g Oral Daily  . pantoprazole  40 mg Oral QAC breakfast  . polyethylene glycol  17 g Oral Q1400  . pravastatin  20 mg Oral QHS  . predniSONE  14 mg Oral Q breakfast  . sodium chloride flush  3 mL Intravenous Q12H  . tiotropium  18 mcg Inhalation Daily  . traMADol  100 mg Oral 4 times per day  . vitamin C  250 mg Oral Daily  . [START ON 08/15/2015] Vitamin D (Ergocalciferol)  50,000 Units Oral Q7 days   Continuous Infusions:   Principal Problem:   Chest pain with moderate  risk of acute coronary syndrome Active Problems:   Hypothyroidism   Anemia in chronic renal disease   Schizoaffective disorder (HCC)   Essential hypertension   Polymyalgia rheumatica (HCC)   Diabetes mellitus, type 2 (HCC)   History of TIA - May 2015- Plavix   MGUS (monoclonal gammopathy of unknown significance)   CKD stage 4, GFR 15-29 ml/min     Time spent: 85 minutes    Muhannad Bignell M.D. [facility] Pager [phone]. If 7PM-7AM, please contact night-coverage at [email or web address], password [facility] 08/13/2015, 9:15 PM

## 2015-08-13 NOTE — H&P (View-Only) (Signed)
       Patient Name: Rebecca [name] Date of Encounter: 08/13/2015    SUBJECTIVE: Very vague history from the patient. Had a long conversation with the daughter yesterday who was convinced that the patient was having chest pain responsive to nitroglycerin. Much more nitroglycerin has been used recently for recurring episodes.  TELEMETRY:  Normal sinus rhythm Filed Vitals:   08/12/15 1306 08/12/15 2033 08/12/15 2208 08/13/15 0350  BP: 139/70  156/58 148/61  Pulse: 74  69 66  Temp: 97.5 F (36.4 C)  98.2 F (36.8 C) 97.8 F (36.6 C)  TempSrc: Oral  Oral Oral  Resp: 18  18 18   Height:      Weight:      SpO2: 100% 100% 98% 98%    Intake/Output Summary (Last 24 hours) at 08/13/15 0835 Last data filed at 08/12/15 2100  Gross per 24 hour  Intake    720 ml  Output      0 ml  Net    720 ml   LABS: Basic Metabolic Panel:  Recent Labs  08/12/15 0638 08/13/15 0311  NA 142 137  K 5.1 4.5  CL 106 104  CO2 24 25  GLUCOSE 105* 92  BUN 54* 46*  CREATININE 1.86* 1.57*  CALCIUM 9.4 8.7*   CBC:  Recent Labs  08/10/15 2045  08/12/15 0638 08/13/15 0311  WBC 22.8*  < > 14.7* 14.3*  NEUTROABS 21.3*  --   --   --   HGB 10.4*  < > 10.4* 9.2*  HCT 31.8*  < > 31.0* 28.4*  MCV 95.8  < > 96.3 95.6  PLT 223  < > 206 199  < > = values in this interval not displayed. Cardiac Enzymes:  Recent Labs  08/11/15 0236 08/11/15 0832 08/11/15 1455  TROPONINI <0.03 <0.03 <0.03   BNP    Component Value Date/Time   BNP 91.0 08/10/2015 2045    ProBNP    Component Value Date/Time   PROBNP 493.1* 07/13/2012 1755    Radiology/Studies:  No acute abnormality is noted on chest x-ray.  Myocardial Perfusion: High risk study with evidence of anterior wall and inferolateral wall ischemia.  ECHOCARDIOGRAM: 08/11/15 reveals normal LV function with mild left atrial enlargement.  Physical Exam: Blood pressure 148/61, pulse 66, temperature 97.8 F (36.6 C), temperature source Oral, resp.  rate 18, height 5\' 4"  (1.626 m), weight 158 lb 8 oz (71.895 kg), SpO2 98 %. Weight change:   Wt Readings from Last 3 Encounters:  08/11/15 158 lb 8 oz (71.895 kg)  01/07/15 158 lb 12.8 oz (72.031 kg)  01/06/15 158 lb (71.668 kg)    No significant JVD is noted Chest is clear Cardiac exam no murmur or gallop Extremities reveal no edema  ASSESSMENT:  1. High-risk myocardial perfusion study reason the possibility of left main equivalent anatomy versus false positive study 2. CK, stage III-IV 3. Schizoaffective disorder and memory loss.  Plan:  1. Aggressive hydration precath today 2. Again, long conversation with daughter concerning the risks and potential benefits. With high risk study and potential left main equivalent, anatomy needs to be revealed. 3. LV function is known to be normal. Left trigger log refused not needed. 4. If PCI as needed, consider staged procedure to decrease exposure to contrast but will leave to the discretion of the operator.  Demetrios Isaacs 08/13/2015, 8:35 AM

## 2015-08-14 ENCOUNTER — Telehealth: Payer: Self-pay | Admitting: Internal Medicine

## 2015-08-14 DIAGNOSIS — R079 Chest pain, unspecified: Secondary | ICD-10-CM | POA: Diagnosis not present

## 2015-08-14 DIAGNOSIS — I25110 Atherosclerotic heart disease of native coronary artery with unstable angina pectoris: Secondary | ICD-10-CM | POA: Diagnosis not present

## 2015-08-14 LAB — BASIC METABOLIC PANEL
Anion gap: 5 (ref 5–15)
BUN: 36 mg/dL — AB (ref 6–20)
CALCIUM: 8.7 mg/dL — AB (ref 8.9–10.3)
CO2: 24 mmol/L (ref 22–32)
CREATININE: 1.46 mg/dL — AB (ref 0.44–1.00)
Chloride: 111 mmol/L (ref 101–111)
GFR, EST AFRICAN AMERICAN: 39 mL/min — AB (ref >60)
GFR, EST NON AFRICAN AMERICAN: 34 mL/min — AB (ref >60)
Glucose, Bld: 109 mg/dL — ABNORMAL HIGH (ref 65–99)
Potassium: 5 mmol/L (ref 3.5–5.1)
SODIUM: 140 mmol/L (ref 135–145)

## 2015-08-14 LAB — CBC
HCT: 29.3 % — ABNORMAL LOW (ref 36.0–46.0)
Hemoglobin: 9.3 g/dL — ABNORMAL LOW (ref 12.0–15.0)
MCH: 31 pg (ref 26.0–34.0)
MCHC: 31.7 g/dL (ref 30.0–36.0)
MCV: 97.7 fL (ref 78.0–100.0)
PLATELETS: 180 10*3/uL (ref 150–400)
RBC: 3 MIL/uL — AB (ref 3.87–5.11)
RDW: 14 % (ref 11.5–15.5)
WBC: 11.6 10*3/uL — ABNORMAL HIGH (ref 4.0–10.5)

## 2015-08-14 LAB — GLUCOSE, CAPILLARY
GLUCOSE-CAPILLARY: 81 mg/dL (ref 65–99)
GLUCOSE-CAPILLARY: 186 mg/dL — AB (ref 65–99)

## 2015-08-14 MED ORDER — BISACODYL 10 MG RE SUPP
10.0000 mg | Freq: Once | RECTAL | Status: AC
  Administered 2015-08-14: 10 mg via RECTAL
  Filled 2015-08-14: qty 1

## 2015-08-14 MED ORDER — CEFUROXIME AXETIL 500 MG PO TABS: 500.0000 mg | ORAL_TABLET | Freq: Two times a day (BID) | ORAL | Status: DC

## 2015-08-14 NOTE — Telephone Encounter (Signed)
Called daughter and addressed questions. Please schedule f/u in 1 week with MLP.  Dr. Lemmie Evens

## 2015-08-14 NOTE — Progress Notes (Signed)
Pt has been discharged home with family. Discharge information was discussed with pt and pt's son. All questions were answered. IV and telemetry box were removed. Pt had a small skin tear on arm and a foam dressing was applied. Pt left the floor with all of her belongings and was in no distress at time of discharge. Pt left the floor via wheelchair and was wheeled down by me, to meet her son at the main entrance.   Grant Fontana RN, BSN

## 2015-08-14 NOTE — Progress Notes (Signed)
Spoke with daughter [name] over the phone and updated her on the pt's plan of care. All questions were answered.  Grant Fontana RN, BSN

## 2015-08-14 NOTE — Telephone Encounter (Signed)
Dr. Debara Pickett - can you call patient's daughter back this evening? Patient has been in hospital -- has been cleared by cardiology to go home,  expecting hospitalist to discharge tonight, but daughter has some questions. She is very concerned that some things got missed, and she is unclear about what they are to do to follow up.  Phone is [phone] - daughter is [name].

## 2015-08-14 NOTE — Telephone Encounter (Signed)
Msg sent to scheduling for post-hospital visit scheduling.

## 2015-08-14 NOTE — Discharge Summary (Signed)
Physician Discharge Summary  Rebecca [name] [id number] DOB: 08/16/1940 DOA: 08/10/2015  PCP: [name], MD  Admit date: 08/10/2015 Discharge date: 08/14/2015  Time spent: greater than 30 minutes  Recommendations for Outpatient Follow-up:  1.    Discharge Diagnoses:  Principal Problem:   Chest pain with moderate risk of acute coronary syndrome Active Problems:   Hypothyroidism   Anemia in chronic renal disease   Schizoaffective disorder (HCC)   Essential hypertension   Polymyalgia rheumatica (HCC)   Diabetes mellitus, type 2 (HCC)   History of TIA - May 2015- Plavix   MGUS (monoclonal gammopathy of unknown significance)   CKD stage 4, GFR 15-29 ml/min    Right hip pain   Discharge Condition: stable  Diet recommendation: carb modified heart healthy  Filed Weights   08/11/15 0023 08/14/15 0519  Weight: 71.895 kg (158 lb 8 oz) 74.3 kg (163 lb 12.8 oz)    History of present illness:  75 y.o. female with history of CAD status post stenting in 2006 and stress test in 2014 which was unremarkable, diabetes mellitus type 2 on diet, polymyalgia rheumatica on prednisone COPD, diastolic CHF presents to the ER because of chest pain. Patient started developing chest pain last evening at home and patient's daughter gave 3 and sublingual nitroglycerin in a span of 30 minutes. Patient chest pain initially improved but started recurring at that point EMS was called and patient received aspirin following which patient chest pain resolved. Patient is chest pain-free at this time. Denies any associated shortness of breath or productive cough fever chills. Patient given history of CAD will be admitted to further rule out ACS. In addition patient has been complaining of increasing right hip pain denies any fall. Patient also has noticed increased bruising of the extremities without any fall. Patient notices increasing swelling in the left lower extremity.   Hospital Course:  #1 chest  pain Patient with history of coronary artery disease status post stent. Concern for acute coronary syndrome. Patient currently chest pain-free. Cardiac enzymes negative 3. EKG with no ischemic changes noted. 2-D echo with EF of 60-65% with no wall motion abnormalities. Myocardial perfusion stress test with a high risk study with evidence of anterior wall and inferior wall ischemia. Patient subsequently underwent cardiac catheterization which showed no significant obstructive disease. Continue Plavix, diltiazem,imdur, Toprol-XL,lovaza, Pravachol, PPI. Suspect musculoskeletal  #2 UTI Started on rocephin, changed to ceftin. Culture grew e coli sensitive to Rocephin  #3 COPD Stable. Chest x-ray negative. Continue Claritin, PPI, Spiriva.  #4 polymyalgia rheumatica on prednisone with chronic leukocytosis Continue prednisone 14 mg daily. Patient has been coordinating with a rheumatologist to taper her prednisone to 7-8 mg daily. Outpatient follow-up.  #7 left lower extremity swelling and increased bruising Patient had lower extremity Dopplers done one week prior to admission which was negative. Repeat Doppler negative for DVT. Swelling improved.  #8 right hip pain X-rays of the pelvis negative. X-ray showing mild degenerative change. Outpatient follow-up.   Consultants:  Cardiology: Dr. Daneen Schick 08/12/2015  Procedures:  Stress test 08/12/2015  Cardiac catheterization 08/13/2015  Discharge Exam: Filed Vitals:   08/13/15 2118 08/14/15 0519  BP: 137/58 133/40  Pulse: 70 70  Temp: 97.9 F (36.6 C) 98.3 F (36.8 C)  Resp: 18 20    General: a and o Cardiovascular: RRR Respiratory: CTA  Discharge Instructions    Current Discharge Medication List    START taking these medications   Details  cefUROXime (CEFTIN) 500 MG tablet Take 1  tablet (500 mg total) by mouth 2 (two) times daily with a meal. Qty: 8 tablet, Refills: 0      CONTINUE these medications which have NOT  CHANGED   Details  acetaminophen (TYLENOL) 500 MG tablet Take 1,000 mg by mouth 2 (two) times daily. Take 2 tablets (1000 mg) by mouth daily with breakfast, take 2 tablets (1000 mg) at 2pm    albuterol (PROAIR HFA) 108 (90 Base) MCG/ACT inhaler Inhale 2 puffs into the lungs every 6 (six) hours as needed for wheezing or shortness of breath.    alendronate (FOSAMAX) 70 MG tablet Take 1 tablet (70 mg total) by mouth every 7 (seven) days. Take with a full glass of water on an empty stomach. Qty: 4 tablet, Refills: 5    cetirizine (ZYRTEC) 10 MG tablet Take 10 mg by mouth at bedtime.     clopidogrel (PLAVIX) 75 MG tablet Take 1 tablet (75 mg total) by mouth daily with breakfast. Qty: 30 tablet, Refills: 0    clozapine (CLOZARIL) 200 MG tablet Take 300 mg by mouth at bedtime. ]    Cranberry (SM CRANBERRY) 300 MG tablet Take 300 mg by mouth at bedtime.     diltiazem (CARDIZEM LA) 180 MG 24 hr tablet Take 180 mg by mouth daily with breakfast. Refills: 0    docusate sodium (COLACE) 100 MG capsule Take 200 mg by mouth at bedtime.  Qty: 10 capsule, Refills: 0    ferrous sulfate 325 (65 FE) MG EC tablet Take 325 mg by mouth 2 (two) times daily.      Fluticasone-Salmeterol (ADVAIR) 250-50 MCG/DOSE AEPB Inhale 1 puff into the lungs at bedtime.    isosorbide mononitrate (IMDUR) 60 MG 24 hr tablet Take 1 tablet (60 mg total) by mouth daily. Qty: 30 tablet, Refills: 6    levothyroxine (SYNTHROID, LEVOTHROID) 75 MCG tablet Take 75 mcg by mouth daily before breakfast.   Associated Diagnoses: MGUS (monoclonal gammopathy of unknown significance)    metoprolol succinate (TOPROL-XL) 100 MG 24 hr tablet Take 100 mg by mouth daily with breakfast. Take with or immediately following a meal.    Multiple Vitamin (MULTIVITAMIN WITH MINERALS) TABS Take 1 tablet by mouth at bedtime. [employer]    nitroGLYCERIN (NITROSTAT) 0.4 MG SL tablet Place 1 tablet (0.4 mg total) under the tongue every 5 (five)  minutes as needed. For chest pain Qty: 25 tablet, Refills: 3    Omega-3 Fatty Acids (FISH OIL PO) Take 1 capsule by mouth at bedtime.    pantoprazole (PROTONIX) 40 MG tablet Take 1 tablet (40 mg total) by mouth daily. Qty: 30 tablet, Refills: 11    Polyethyl Glycol-Propyl Glycol (SYSTANE OP) Place 1 drop into both eyes 2 (two) times daily as needed (dry eyes).     polyethylene glycol (MIRALAX / GLYCOLAX) packet Take 17 g by mouth daily at 2 PM. Mix in 8 oz liquid and drink    pravastatin (PRAVACHOL) 20 MG tablet Take 20 mg by mouth at bedtime.    !! predniSONE (DELTASONE) 1 MG tablet Take 4 mg by mouth daily with breakfast. Take with #2 5 mg tablets for a 14 mg dose Refills: 0    !! predniSONE (DELTASONE) 5 MG tablet Take 10 mg by mouth daily with breakfast. Take with #4 1 mg tablets for a 14 mg dose Refills: 0    temazepam (RESTORIL) 15 MG capsule Take 15 mg by mouth at bedtime as needed for sleep.     Tiotropium  Bromide Monohydrate (SPIRIVA RESPIMAT) 2.5 MCG/ACT AERS Inhale 2 puffs into the lungs daily.    traMADol (ULTRAM) 50 MG tablet Take 50-100 mg by mouth See admin instructions. Take 2 tablets (100 mg) by mouth daily at 2pm, may also take 1-2 tablets as needed for pain    traMADol (ULTRAM-ER) 300 MG 24 hr tablet Take 300 mg by mouth daily with breakfast.     Vitamin D, Ergocalciferol, (DRISDOL) 50000 UNITS CAPS capsule Take 1 capsule (50,000 Units total) by mouth every 7 (seven) days. Qty: 4 capsule, Refills: 0     !! - Potential duplicate medications found. Please discuss with provider.     Allergies  Allergen Reactions  . Levaquin [Levofloxacin] Other (See Comments)    Ruptured tendon   Follow-up Information    Follow up with [name], MD.   Specialty:  Internal Medicine   Why:  As needed   Contact information:   [address] [place] [address] [phone]        The results of significant diagnostics from this hospitalization (including  imaging, microbiology, ancillary and laboratory) are listed below for reference.    Significant Diagnostic Studies: Dg Chest 2 View  08/10/2015  CLINICAL DATA:  Chest pain for 1 day EXAM: CHEST  2 VIEW COMPARISON:  10/16/2013 FINDINGS: Cardiac shadow is mildly enlarged but stable. The lungs are well aerated bilaterally. Chronic blunting of the left costophrenic angle is seen. No acute infiltrate is noted. No acute bony abnormality is seen. IMPRESSION: No active cardiopulmonary disease. Electronically Signed   By: Inez Catalina M.D.   On: 08/10/2015 21:05   Dg Pelvis Portable  08/11/2015  CLINICAL DATA:  Right hip pain, monoclonal gammopathy, polymyalgia rheumatica. EXAM: PORTABLE PELVIS 1-2 VIEWS COMPARISON:  AP pelvis of September 26, 2012. FINDINGS: Bowel gas limits evaluation of portions of the sacrum and iliac bones. The bony pelvis is adequately mineralized. There is no lytic or blastic lesion. There is no acute fracture nor dislocation. The sacrum and SI joints are normal. The right hip exhibits mild degenerative joint space loss. IMPRESSION: No lytic or blastic bony lesion is observed. There is mild degenerative change of the right hip. Electronically Signed   By: David  Martinique M.D.   On: 08/11/2015 07:45   Nm Myocar Multi W/spect W/wall Motion / Ef  08/12/2015  CLINICAL DATA:  Chest pain, history of angioplasty and stent placement. Diabetes hypertension, shortness of breath and coronary artery disease. EXAM: MYOCARDIAL IMAGING WITH SPECT (PHARMACOLOGIC-STRESS) GATED LEFT VENTRICULAR WALL MOTION STUDY LEFT VENTRICULAR EJECTION FRACTION TECHNIQUE: Intravenous infusion of Lexiscan was performed under the supervision of the Cardiology staff. At peak effect of the drug, 10 mCi Tc-8m sestamibi was injected intravenously and standard myocardial SPECT imaging was performed. Quantitative gated imaging was also performed to evaluate left ventricular wall motion, and estimate left ventricular ejection fraction.  COMPARISON:  None. FINDINGS: Perfusion: Examination is abnormal. On the stress images there is decreased uptake within the mid and apical segments of the anterior wall as well as the mid and apical segments of the lateral wall. There is improved uptake within these areas on the rest images. Wall Motion: Normal left ventricular wall motion. No left ventricular dilation. Left Ventricular Ejection Fraction: 68 % End diastolic volume 59 ml End systolic volume 19 ml IMPRESSION: 1. Examination is abnormal. Reversibility is identified in both the anterior and lateral walls. 2. Normal left ventricular wall motion. 3. Left ventricular ejection fraction 68% 4. High-risk stress test findings*. *2012  Appropriate Use Criteria for Coronary Revascularization Focused Update: J Am Coll Cardiol. [id number]. http://content.[email or web address].aspx?articleid=[email or web address] These results were called by telephone at the time of interpretation on 08/12/2015 at 12:56 pm to Dr. Daneen Schick , who verbally acknowledged these results. Electronically Signed   By: Kerby Moors M.D.   On: 08/12/2015 12:56    Microbiology: No results found for this or any previous visit (from the past 240 hour(s)).   Labs: Basic Metabolic Panel:  Recent Labs Lab 08/10/15 2045 08/11/15 0236 08/12/15 [id number] 08/13/15 0311 08/14/15 0345  NA 138  --  142 137 140  K 4.8  --  5.1 4.5 5.0  CL 106  --  106 104 111  CO2 23  --  24 25 24   GLUCOSE 133*  --  105* 92 109*  BUN 52*  --  54* 46* 36*  CREATININE 1.94* 1.97* 1.86* 1.57* 1.46*  CALCIUM 9.5  --  9.4 8.7* 8.7*   Liver Function Tests: No results for input(s): AST, ALT, ALKPHOS, BILITOT, PROT, ALBUMIN in the last 168 hours. No results for input(s): LIPASE, AMYLASE in the last 168 hours. No results for input(s): AMMONIA in the last 168 hours. CBC:  Recent Labs Lab 08/10/15 2045 08/11/15 0236 08/12/15 [id number] 08/13/15 0311 08/14/15 0345  WBC 22.8* 20.3* 14.7* 14.3* 11.6*  NEUTROABS  21.3*  --   --   --   --   HGB 10.4* 9.6* 10.4* 9.2* 9.3*  HCT 31.8* 29.9* 31.0* 28.4* 29.3*  MCV 95.8 95.8 96.3 95.6 97.7  PLT 223 208 206 199 180   Cardiac Enzymes:  Recent Labs Lab 08/11/15 0236 08/11/15 0832 08/11/15 1455  TROPONINI <0.03 <0.03 <0.03   BNP: BNP (last 3 results)  Recent Labs  08/10/15 2045  BNP 91.0    ProBNP (last 3 results) No results for input(s): PROBNP in the last 8760 hours.  CBG:  Recent Labs Lab 08/13/15 0556 08/13/15 1118 08/13/15 1649 08/13/15 2115 08/14/15 0621  GLUCAP 96 109* 114* 165* 81       Signed:  Delfina Redwood MD [facility] 08/14/2015, 9:23 AM

## 2015-08-14 NOTE — Telephone Encounter (Signed)
New message      Pt is going to be released from the hosp this evening. Daughter want to talk to Dr Debara Pickett

## 2015-08-15 ENCOUNTER — Telehealth: Payer: Self-pay | Admitting: Internal Medicine

## 2015-08-15 LAB — URINE CULTURE

## 2015-08-15 NOTE — Telephone Encounter (Signed)
TCM phone call . Appt is on 08/19/15 at 9am w/ Richardson Dopp at the [facility] .  Thanks

## 2015-08-18 ENCOUNTER — Telehealth: Payer: Self-pay | Admitting: Interventional Cardiology

## 2015-08-18 DIAGNOSIS — F250 Schizoaffective disorder, bipolar type: Secondary | ICD-10-CM | POA: Diagnosis not present

## 2015-08-18 NOTE — Telephone Encounter (Signed)
New Message   Pt dtr calling for a permission letter from Dr Debara Pickett to be faxed to [place] for [place] in order for pt to resume normal activity. Fax# [phone] attn: Kennedy Bucker. Please advise

## 2015-08-18 NOTE — Progress Notes (Signed)
Cardiology Office Note:    Date:  08/19/2015   ID:  Rebecca [name], DOB Oct 28, 1940, MRN [id number]  PCP:  [name], MD  Cardiologist:  Dr. K. Mali Hilty   Electrophysiologist:  N/a Nephrologist - Dr. Moshe Cipro   Chief Complaint  Patient presents with  . Hospitalization Follow-up    Chest pain s/p Cath    History of Present Illness:     Rebecca [name] is a 75 y.o. female with a hx of CAD s/p Taxus DES to RCA in 2006, CKD, COPD, prior CVA, DM2, HL, PMR, MGUS, schizoaffective disorder.  Last seen by Dr. K. Mali Hilty 7/16.  Admitted 2/27-3/2 with chest pain. Cardiac enzymes were negative. She was felt to be high risk for cardiac catheterization. Echocardiogram demonstrated EF 60-65%. Inpatient Myoview was arranged. Nuclear images were felt to be high risk. Cardiac catheterization was arranged. This demonstrated patent RCA stent and no significant obstructive CAD elsewhere. Medical therapy was recommended. Because of lower extremity swelling, bilateral venous duplex scan was obtained. This was negative for DVT. Patient was also treated for UTI. Unfortunately, no discharge summary is available yet.  She returns for Cardiology FU.  Here today with her daughter. Since discharge, she has been doing well. She did have improvement in her breathing in the hospital with nebulizer treatments. She does get fatigued with activity. She's been sleeping sitting up for the past couple weeks. She denies true PND. She had significant bruising in her left leg. It is slowly resolving. She notes bilateral ankle edema. She denies syncope. She's had some atypical chest pain since discharge. No anginal symptoms.   Past Medical History  Diagnosis Date  . Diabetes mellitus   . Frozen shoulder     right  . Obesity   . Renal insufficiency   . COPD (chronic obstructive pulmonary disease) ([place])   . Shortness of breath   . Stroke ([place])   . CAD (coronary artery disease) 2006    Taxus stent of mid RCA.  negative myoview 2014 and normal LV function  . Diastolic heart failure, NYHA class 1 ([place])   . Polymyalgia rheumatica ([place])   . 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 Dysuria 01/06/2015    Past Surgical History  Procedure Laterality Date  . Partial hysterectomy  1995  . Abdominal hysterectomy    . Cholecystectomy    . Coronary angioplasty with stent placement  2006    Taxus DES to RCA, 50 % residual  . Cardiac catheterization N/A 08/13/2015    Procedure: Left Heart Cath and Coronary Angiography;  Surgeon: Jettie Booze, MD;  Location: [facility];  Service: Cardiovascular;  Laterality: N/A;    Current Medications: Outpatient Prescriptions Prior to Visit  Medication Sig Dispense Refill  . acetaminophen (TYLENOL) 500 MG tablet Take 1,000 mg by mouth 2 (two) times daily. Take 2 tablets (1000 mg) by mouth daily with breakfast, take 2 tablets (1000 mg) at 2pm    . albuterol (PROAIR HFA) 108 (90 Base) MCG/ACT inhaler Inhale 2 puffs into the lungs every 6 (six) hours as needed for wheezing or shortness of breath.    Marland Kitchen alendronate (FOSAMAX) 70 MG tablet Take 1 tablet (70 mg total) by mouth every 7 (seven) days. Take with a full glass of water on an empty stomach. (Patient taking differently: Take 70 mg by mouth every 7 (seven) days. Take with a full glass of water on an empty stomach. On Saturdays) 4 tablet 5  .  cetirizine (ZYRTEC) 10 MG tablet Take 10 mg by mouth at bedtime.     . clopidogrel (PLAVIX) 75 MG tablet Take 1 tablet (75 mg total) by mouth daily with breakfast. (Patient taking differently: Take 75 mg by mouth at bedtime. ) 30 tablet 0  . clozapine (CLOZARIL) 200 MG tablet Take 300 mg by mouth at bedtime. ]    . Cranberry (SM CRANBERRY) 300 MG tablet Take 300 mg by mouth at bedtime.     Marland Kitchen diltiazem (CARDIZEM LA) 180 MG 24 hr tablet Take 180 mg by mouth daily with breakfast.  0  . docusate sodium (COLACE) 100 MG capsule Take 200 mg by mouth at  bedtime.  10 capsule 0  . ferrous sulfate 325 (65 FE) MG EC tablet Take 325 mg by mouth 2 (two) times daily.      . Fluticasone-Salmeterol (ADVAIR) 250-50 MCG/DOSE AEPB Inhale 1 puff into the lungs at bedtime.    . isosorbide mononitrate (IMDUR) 60 MG 24 hr tablet Take 1 tablet (60 mg total) by mouth daily. (Patient taking differently: Take 60 mg by mouth daily with breakfast. ) 30 tablet 6  . levothyroxine (SYNTHROID, LEVOTHROID) 75 MCG tablet Take 75 mcg by mouth daily before breakfast.    . metoprolol succinate (TOPROL-XL) 100 MG 24 hr tablet Take 100 mg by mouth daily with breakfast. Take with or immediately following a meal.    . Multiple Vitamin (MULTIVITAMIN WITH MINERALS) TABS Take 1 tablet by mouth at bedtime. Psychiatrist    . nitroGLYCERIN (NITROSTAT) 0.4 MG SL tablet Place 1 tablet (0.4 mg total) under the tongue every 5 (five) minutes as needed. For chest pain (Patient taking differently: Place 0.4 mg under the tongue every 5 (five) minutes as needed for chest pain. ) 25 tablet 3  . Omega-3 Fatty Acids (FISH OIL PO) Take 1 capsule by mouth at bedtime.    . pantoprazole (PROTONIX) 40 MG tablet Take 1 tablet (40 mg total) by mouth daily. (Patient taking differently: Take 40 mg by mouth daily before breakfast. ) 30 tablet 11  . Polyethyl Glycol-Propyl Glycol (SYSTANE OP) Place 1 drop into both eyes 2 (two) times daily as needed (dry eyes).     . polyethylene glycol (MIRALAX / GLYCOLAX) packet Take 17 g by mouth daily at 2 PM. Mix in 8 oz liquid and drink    . pravastatin (PRAVACHOL) 20 MG tablet Take 20 mg by mouth at bedtime.    . predniSONE (DELTASONE) 1 MG tablet Take 4 mg by mouth daily with breakfast. Take with #2 5 mg tablets for a 14 mg dose  0  . predniSONE (DELTASONE) 5 MG tablet Take 10 mg by mouth daily with breakfast. Take with #4 1 mg tablets for a 14 mg dose  0  . temazepam (RESTORIL) 15 MG capsule Take 15 mg by mouth at bedtime as needed for sleep.     . Tiotropium  Bromide Monohydrate (SPIRIVA RESPIMAT) 2.5 MCG/ACT AERS Inhale 2 puffs into the lungs daily.    . traMADol (ULTRAM) 50 MG tablet Take 2 tablets (100 mg) by mouth daily at 2pm, may also take 1-2 tablets as needed for pain    . traMADol (ULTRAM-ER) 300 MG 24 hr tablet Take 300 mg by mouth daily with breakfast.     . Vitamin D, Ergocalciferol, (DRISDOL) 50000 UNITS CAPS capsule Take 1 capsule (50,000 Units total) by mouth every 7 (seven) days. (Patient taking differently: Take 50,000 Units by mouth every 7 (  seven) days. Fridays) 4 capsule 0  . cefUROXime (CEFTIN) 500 MG tablet Take 1 tablet (500 mg total) by mouth 2 (two) times daily with a meal. (Patient not taking: Reported on 08/19/2015) 8 tablet 0   No facility-administered medications prior to visit.     Allergies:   Levaquin   Social History   Social History  . Marital Status: Divorced    Spouse Name: N/A  . Number of Children: 3  . Years of Education: 9th   Occupational History  . homemaker    Social History Main Topics  . Smoking status: Former Smoker -- 1.00 packs/day for 35 years    Quit date: 04/14/2004  . Smokeless tobacco: Never Used  . Alcohol Use: No  . Drug Use: No  . Sexual Activity: Not Asked   Other Topics Concern  . None   Social History Narrative   Daughter [name] and son are very involved.     [name] comes to most office visits.   She now lives at home with daughter [name]   Daughter is HCPOA, not averse to SNF/rehab in short-term, but does not wish pt to return to [facility] and requests that pt/pt's care not be discussed by [facility] providers to [facility], moving forward.   Caffeine Use: 1-3 cups daily     Family History:  The patient's family history includes Arthritis in her father; Cancer in her brother, cousin, daughter, and maternal uncle; Diabetes in her brother, brother, brother, and mother; Healthy in her brother, brother, brother, and sister; Heart disease in her brother, brother, brother,  father, and mother; Osteoporosis in her sister.   ROS:   Please see the history of present illness.    Review of Systems  Constitution: Positive for diaphoresis and malaise/fatigue.  HENT: Positive for headaches.   Eyes: Positive for visual disturbance.  Cardiovascular: Positive for leg swelling.  Respiratory: Positive for cough, shortness of breath, snoring and wheezing.   Hematologic/Lymphatic: Bruises/bleeds easily.  Musculoskeletal: Positive for back pain and myalgias.  Gastrointestinal: Positive for constipation.  Genitourinary: Positive for incomplete emptying.  Neurological: Positive for dizziness and loss of balance.  All other systems reviewed and are negative.   Physical Exam:    VS:  BP 132/70 mmHg  Pulse 70  Ht 5\' 3"  (1.6 m)  Wt 162 lb 12.8 oz (73.846 kg)  BMI 28.85 kg/m2   GEN: Well nourished, well developed, in no acute distress HEENT: normal Neck: JVP 6 cm, no masses Cardiac: Normal S1/S2,  RRR; no murmurs, trace bilat ankle edema  Respiratory:  clear to auscultation bilaterally; no wheezing, rhonchi or rales GI: soft, nontender, distended,  MS: no deformity or atrophy Skin: warm and dry  Neuro:   no focal deficits  Psych: Alert and oriented x 3, normal affect  Wt Readings from Last 3 Encounters:  08/19/15 162 lb 12.8 oz (73.846 kg)  08/14/15 163 lb 12.8 oz (74.3 kg)  01/07/15 158 lb 12.8 oz (72.031 kg)      Studies/Labs Reviewed:     EKG:  EKG is  ordered today.  The ekg ordered today demonstrates NSR, HR 71, normal axis, QTC 412 ms, no change from prior tracing  Recent Labs: 12/30/2014: ALT 27 08/10/2015: B Natriuretic Peptide 91.0 08/12/2015: TSH 0.816 08/14/2015: BUN 36*; Creatinine, Ser 1.46*; Hemoglobin 9.3*; Platelets 180; Potassium 5.0; Sodium 140   Recent Lipid Panel    Component Value Date/Time   CHOL 122 10/17/2013 0513   [name] 80 10/17/2013 0513  [name] 65 10/17/2013 0513   [name] 1.9 10/17/2013 0513   VLDL 16 10/17/2013 0513   [name]  41 10/17/2013 0513   LDLDIRECT 88 09/05/2007 2043    Additional studies/ records that were reviewed today include:   LHC 08/13/15 LAD mid 30% LCx mid 25% RCA mid 25%, mid stent patent  Venous Duplex 08/12/15 No evidence of deep vein thrombosis involving the visualized veins of the right lower extremity and left lower extremity.  Myoview 08/12/15 IMPRESSION: 1. Examination is abnormal. Reversibility is identified in both the anterior and lateral walls. 2. Normal left ventricular wall motion. 3. Left ventricular ejection fraction 68% 4. High-risk stress test findings*.  Echo 08/11/15 Mild focal basal septal hypertrophy, EF 60-65%, no RWMA, mild LAE  Carotid US 2/16 No ICA stenosis   ASSESSMENT:     1. Coronary artery disease involving native coronary artery of native heart without angina pectoris   2. Hyperlipidemia   3. CKD (chronic kidney disease), unspecified stage   4. Essential hypertension   5. Anemia, unspecified anemia type   6. Shortness of breath     PLAN:     In order of problems listed above:  1. CAD - s/p prior DES to the RCA in 2006 and recent LHC with patent stent and no significant objective disease elsewhere. Nuclear stress test prior catheterization was a false positive.  She is stable. No anginal symptoms. Continue Plavix, calcium channel blocker, nitrates, beta blocker, statin.  2. HL - Continue statin.  3. CKD - Patient received hydration prior to cardiac catheterization. Creatinine remained stable during admission to the hospital. Obtain repeat BMET today. Follow-up with nephrology as planned.  4. HTN - Controlled.  5. Anemia - Likely related to chronic disease with CKD.  6. Shortness of breath - Patient and her daughter are worried about congestive heart failure. EF was normal on echocardiogram. LVEDP was slightly elevated at cardiac catheterization. She has been having difficulty laying flat. Lungs are clear. I'm not convinced that she has  significant amount of excess volume. However, I will give her a trial of Lasix 20 mg every other day for 1 week. She should not continue this any longer than 1 week. I have asked her to call us and apprise [place] of her symptoms. If she has significant improvement on diuretic, consider continuing. Obtain BMET, BNP today. Repeat BMET in 1 week.  She did have significant improvement in her breathing with nebulizer treatments in the hospital. She is a prior smoker. Follow-up with primary care tomorrow as planned. Consider further pulmonary evaluation per PCP.    Medication Adjustments/Labs and Tests Ordered: Current medicines are reviewed at length with the patient today.  Concerns regarding medicines are outlined above.  Medication changes, Labs and Tests ordered today are outlined in the Patient Instructions noted below. Patient Instructions  START LASIX 20 MG EVERY OTHER DAY FOR 1 WEEK; THEN STOP; CALL THE OFFICE TO LET Auri Jahnke, PAC KNOW HOW YOU ARE FEELING AFTER STOPPING THE LASIX   Labwork: 1. TODAY BMET  2. BMET TO BE DONE 08/27/15  Testing/Procedures: NONE  Follow-Up: 10/24/15 @ 10 AM WITH DR. HILTY AT [facility]  Any Other Special Instructions Will Be Listed Below (If Applicable).  If you need a refill on your cardiac medications before your next appointment, please call your pharmacy.   Signed, Richardson Dopp, PA-C  08/19/2015 10:16 AM    [address], [place]  [address] Phone: [phone]; Fax: [phone]

## 2015-08-18 NOTE — Telephone Encounter (Signed)
Previously spoke with Rebecca [name], daughter & HCPOA, and informed her that Dr. 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   Returned call to patient's daughter Rebecca [name] and re-explained the situation as denoted above. Patient's daughter expressed disdain for the lack of communication - Radiographer, therapeutic. No further action necessary.

## 2015-08-18 NOTE — Telephone Encounter (Signed)
Pt needs permission to return to [facility]pite [facility] at [facility] for [facility]ment today. Please fax [phone] Att: Unk Lightning do this asap,it starts this morning at 10:00. She said she just found this out. Please call her,so she can arrange for her to go.

## 2015-08-18 NOTE — Telephone Encounter (Signed)
Patient contacted regarding discharge from [facility] on March 3rd 2017.  Patient understands to follow up with provider Kathleen Argue, PA on 08/19/2015 at 0900 at [facility]. Patient understands discharge instructions? yes Patient understands medications and regiment? yes Patient understands to bring all medications to this visit? yes  Spoke with Tana Conch - completed TCM documentation.  Patient/daughter were unaware of the appt for 3/7 and daughter states she might not be able to make it to the appt time given tomorrow  Also informed daughter that Dr. Debara Pickett is not here to provide her a letter as requested but she could likely get one tomorrow at her appt with S. Kathlen Mody  Letter needs to state OK to return to respite care activities and that she was not admitted for a contagious illness.

## 2015-08-18 NOTE — Telephone Encounter (Signed)
This is not Dr.Smith's pt. will fwd to Dr.Hilty's nurse Rod Can

## 2015-08-19 ENCOUNTER — Encounter: Payer: Self-pay | Admitting: Physician Assistant

## 2015-08-19 ENCOUNTER — Telehealth: Payer: Self-pay | Admitting: *Deleted

## 2015-08-19 ENCOUNTER — Ambulatory Visit (INDEPENDENT_AMBULATORY_CARE_PROVIDER_SITE_OTHER): Payer: Medicare Other | Admitting: Physician Assistant

## 2015-08-19 VITALS — BP 132/70 | HR 70 | Ht 63.0 in | Wt 162.8 lb

## 2015-08-19 DIAGNOSIS — E785 Hyperlipidemia, unspecified: Secondary | ICD-10-CM

## 2015-08-19 DIAGNOSIS — R0602 Shortness of breath: Secondary | ICD-10-CM

## 2015-08-19 DIAGNOSIS — N189 Chronic kidney disease, unspecified: Secondary | ICD-10-CM

## 2015-08-19 DIAGNOSIS — I10 Essential (primary) hypertension: Secondary | ICD-10-CM

## 2015-08-19 DIAGNOSIS — D649 Anemia, unspecified: Secondary | ICD-10-CM

## 2015-08-19 DIAGNOSIS — I2510 Atherosclerotic heart disease of native coronary artery without angina pectoris: Secondary | ICD-10-CM

## 2015-08-19 LAB — BRAIN NATRIURETIC PEPTIDE: Brain Natriuretic Peptide: 56.1 pg/mL (ref <100)

## 2015-08-19 LAB — BASIC METABOLIC PANEL
BUN: 35 mg/dL — AB (ref 7–25)
CHLORIDE: 96 mmol/L — AB (ref 98–110)
CO2: 27 mmol/L (ref 20–31)
CREATININE: 1.75 mg/dL — AB (ref 0.60–0.93)
Calcium: 9.4 mg/dL (ref 8.6–10.4)
GLUCOSE: 152 mg/dL — AB (ref 65–99)
POTASSIUM: 5 mmol/L (ref 3.5–5.3)
Sodium: 130 mmol/L — ABNORMAL LOW (ref 135–146)

## 2015-08-19 MED ORDER — FUROSEMIDE 20 MG PO TABS: 20.0000 mg | ORAL_TABLET | ORAL | Status: DC

## 2015-08-19 NOTE — Patient Instructions (Addendum)
START LASIX 20 MG EVERY OTHER DAY FOR 1 WEEK; THEN STOP; CALL THE OFFICE TO LET SCOTT WEAVER, PAC KNOW HOW YOU ARE FEELING AFTER STOPPING THE LASIX   Labwork: 1. TODAY BMET, BNP  2. BMET TO BE DONE 08/27/15  Testing/Procedures: NONE  Follow-Up: 10/24/15 @ 10 AM WITH DR. HILTY AT [facility]  Any Other Special Instructions Will Be Listed Below (If Applicable).  If you need a refill on your cardiac medications before your next appointment, please call your pharmacy.

## 2015-08-19 NOTE — Telephone Encounter (Signed)
DPR for daughter Rebecca [name] who has been notified of pt's lab results and recommendation to limit fluid intake to no more than 50 oz daily and to hold off on taking lasix. BMET 3/15. Daughter agreeable to plan of care for pt.

## 2015-08-21 ENCOUNTER — Other Ambulatory Visit: Payer: Medicare Other

## 2015-08-27 ENCOUNTER — Other Ambulatory Visit (INDEPENDENT_AMBULATORY_CARE_PROVIDER_SITE_OTHER): Payer: Medicare Other | Admitting: *Deleted

## 2015-08-27 DIAGNOSIS — N189 Chronic kidney disease, unspecified: Secondary | ICD-10-CM

## 2015-08-27 DIAGNOSIS — N390 Urinary tract infection, site not specified: Secondary | ICD-10-CM | POA: Diagnosis not present

## 2015-08-27 LAB — BASIC METABOLIC PANEL
BUN: 43 mg/dL — ABNORMAL HIGH (ref 7–25)
CALCIUM: 10.2 mg/dL (ref 8.6–10.4)
CO2: 26 mmol/L (ref 20–31)
Chloride: 99 mmol/L (ref 98–110)
Creat: 1.81 mg/dL — ABNORMAL HIGH (ref 0.60–0.93)
Glucose, Bld: 169 mg/dL — ABNORMAL HIGH (ref 65–99)
Potassium: 5.2 mmol/L (ref 3.5–5.3)
SODIUM: 135 mmol/L (ref 135–146)

## 2015-08-28 ENCOUNTER — Telehealth: Payer: Self-pay | Admitting: *Deleted

## 2015-08-28 NOTE — Telephone Encounter (Signed)
DPR for daughter [name] who has been notified of lab results for pt. Advised to limit dietary rich foods due to K+ 5.2. Daughter asked could pt drink as much now as she wants, I advised to limit fluid still to about 50 oz daily, daughter said thank you.

## 2015-09-02 DIAGNOSIS — M353 Polymyalgia rheumatica: Secondary | ICD-10-CM | POA: Diagnosis not present

## 2015-09-12 DIAGNOSIS — R600 Localized edema: Secondary | ICD-10-CM | POA: Diagnosis not present

## 2015-09-12 DIAGNOSIS — F259 Schizoaffective disorder, unspecified: Secondary | ICD-10-CM | POA: Diagnosis not present

## 2015-09-12 DIAGNOSIS — I130 Hypertensive heart and chronic kidney disease with heart failure and stage 1 through stage 4 chronic kidney disease, or unspecified chronic kidney disease: Secondary | ICD-10-CM | POA: Diagnosis not present

## 2015-09-12 DIAGNOSIS — I509 Heart failure, unspecified: Secondary | ICD-10-CM | POA: Diagnosis not present

## 2015-09-12 DIAGNOSIS — N184 Chronic kidney disease, stage 4 (severe): Secondary | ICD-10-CM | POA: Diagnosis not present

## 2015-09-12 DIAGNOSIS — E871 Hypo-osmolality and hyponatremia: Secondary | ICD-10-CM | POA: Diagnosis not present

## 2015-09-12 DIAGNOSIS — I25118 Atherosclerotic heart disease of native coronary artery with other forms of angina pectoris: Secondary | ICD-10-CM | POA: Diagnosis not present

## 2015-09-12 DIAGNOSIS — R0602 Shortness of breath: Secondary | ICD-10-CM | POA: Diagnosis not present

## 2015-09-12 DIAGNOSIS — I209 Angina pectoris, unspecified: Secondary | ICD-10-CM | POA: Diagnosis not present

## 2015-09-12 DIAGNOSIS — F319 Bipolar disorder, unspecified: Secondary | ICD-10-CM | POA: Diagnosis not present

## 2015-09-12 DIAGNOSIS — Z6828 Body mass index (BMI) 28.0-28.9, adult: Secondary | ICD-10-CM | POA: Diagnosis not present

## 2015-09-12 DIAGNOSIS — J449 Chronic obstructive pulmonary disease, unspecified: Secondary | ICD-10-CM | POA: Diagnosis not present

## 2015-09-15 DIAGNOSIS — Z79899 Other long term (current) drug therapy: Secondary | ICD-10-CM | POA: Diagnosis not present

## 2015-09-16 IMAGING — MR MR MRA HEAD W/O CM
9 of 11 series · 30 of 48 positions shown · non-contrast
Comparison: Head CT 10/16/2013

CLINICAL DATA: Left-sided facial droop and headache.  Weakness.

EXAM:
MRI HEAD WITHOUT CONTRAST
MRA HEAD WITHOUT CONTRAST
TECHNIQUE: Multiplanar, multiecho pulse sequences of the brain and surrounding
structures were obtained without intravenous contrast. Angiographic
images of the head were obtained using MRA technique without
contrast.

[Series 3: DWI · axial · 5.0mm · 1.09mm/px · z∈[-72,+72]mm · 5 of 60 slices shown (1 of 4)]
[im 1/60]
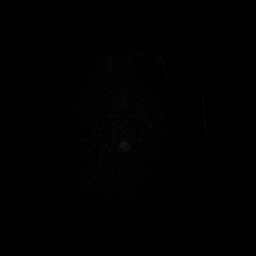
[im 15/60]
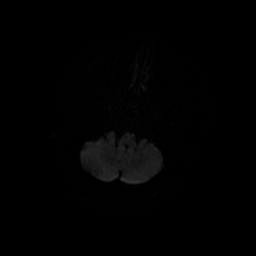
[im 30/60]
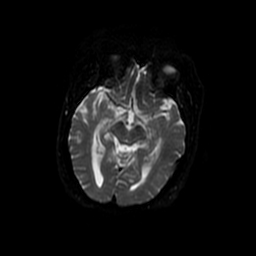
[im 45/60]
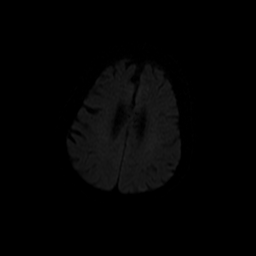
[im 60/60]
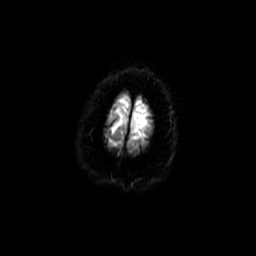

[Series 4: DWI · coronal · 5.0mm · 1.09mm/px · 6 of 66 slices shown (2 of 4)]
[im 1/66]
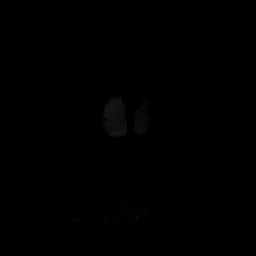
[im 14/66]
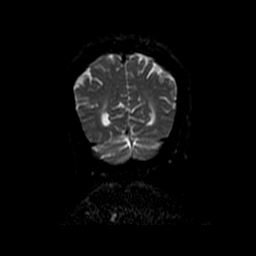
[im 27/66]
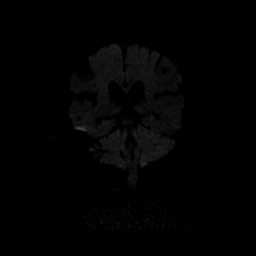
[im 40/66]
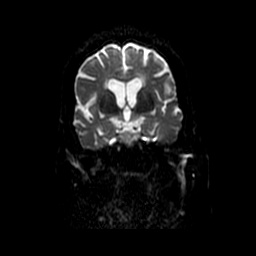
[im 53/66]
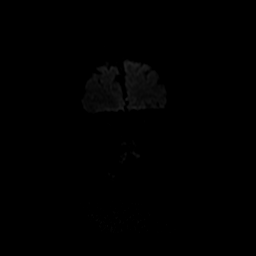
[im 66/66]
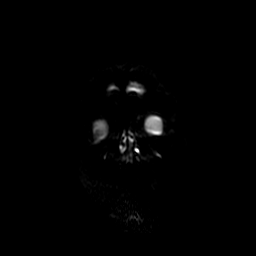

[Series 5: (id) mt fs · axial · 1.4mm · 0.43mm/px · z∈[-72,-21]mm · 5 of 136 slices shown]
[im 1/136]
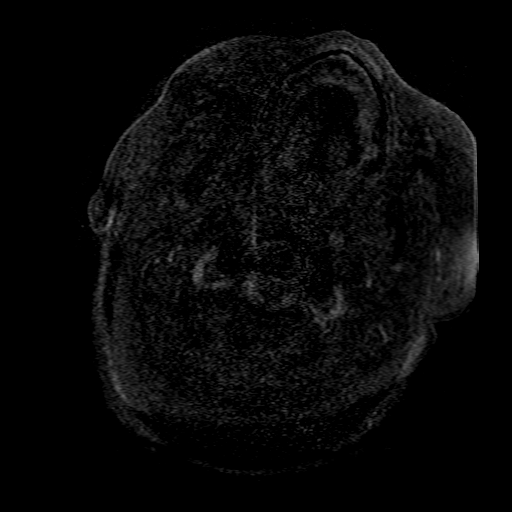
[im 25/136]
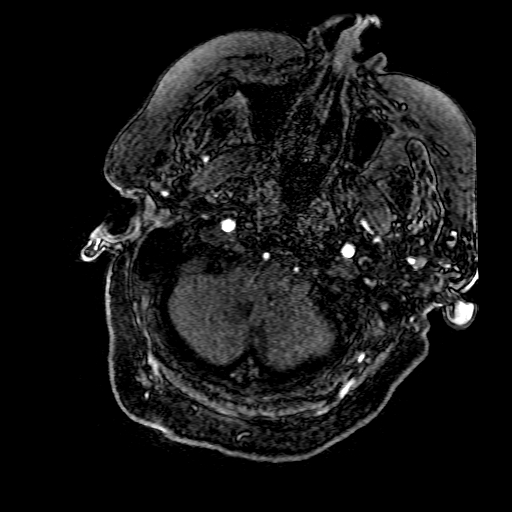
[im 37/136]
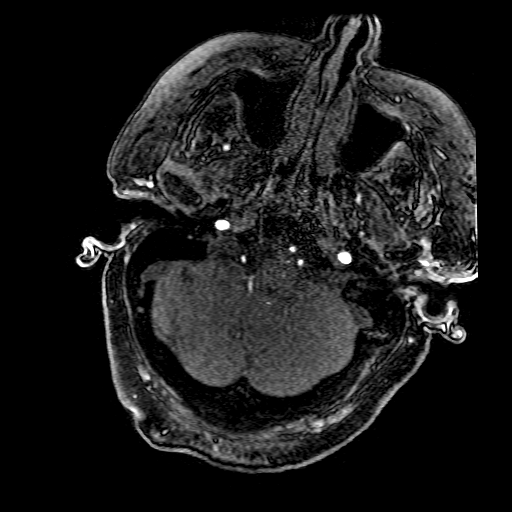
[im 62/136]
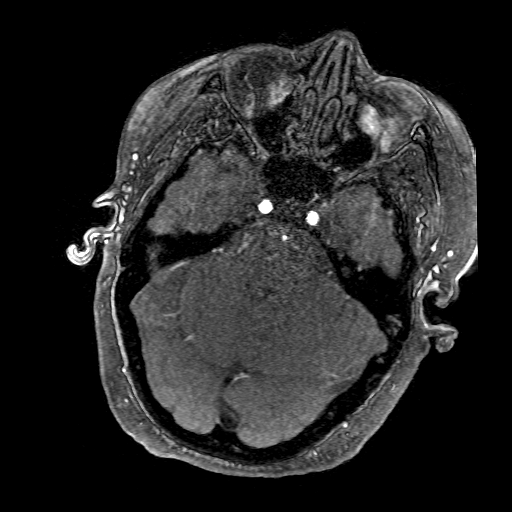
[im 74/136]
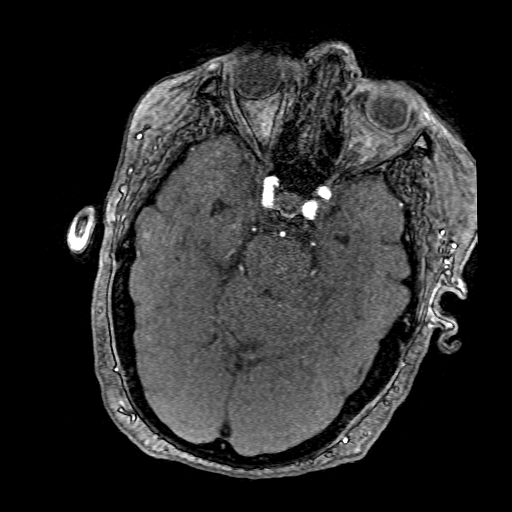

[Series 6: T1 · sagittal · 5.0mm · 0.47mm/px · 2 of 23 slices shown]
[im 1/23]
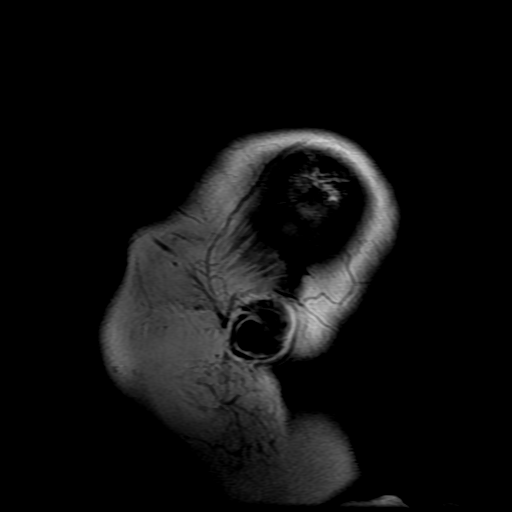
[im 23/23]
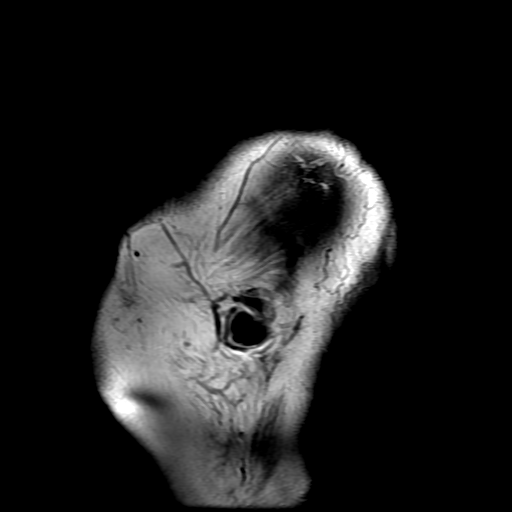

[Series 7: T2 · axial · 5.0mm · 0.43mm/px · z∈[-71,+70]mm · 2 of 23 slices shown (1 of 2)]
[im 1/23]
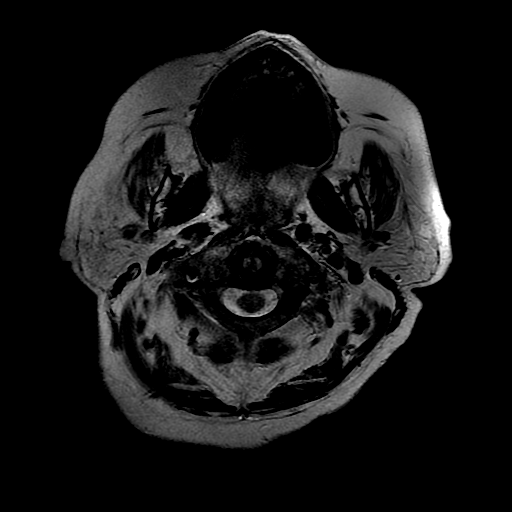
[im 23/23]
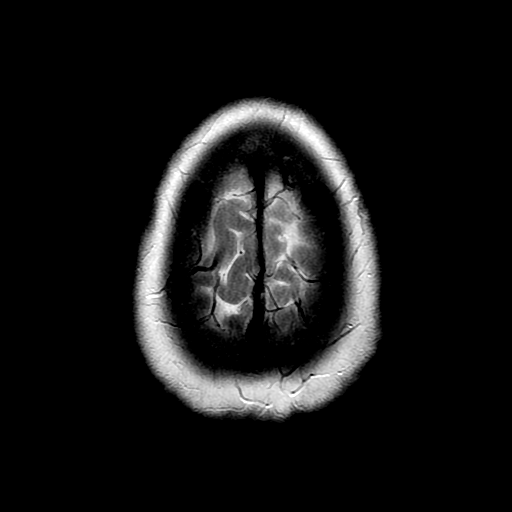

[Series 8: FLAIR · axial · 5.0mm · 0.43mm/px · z∈[-71,+70]mm · 2 of 23 slices shown]
[im 1/23]
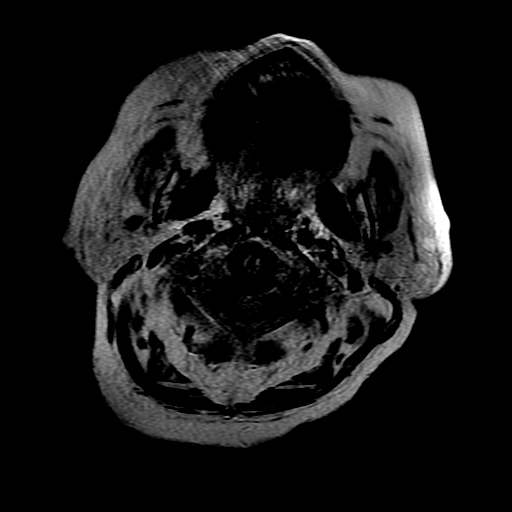
[im 23/23]
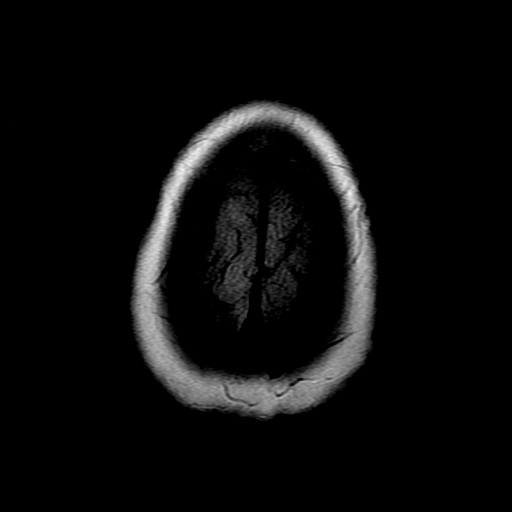

[Series 11: T2 · coronal · 5.0mm · 0.39mm/px · 2 of 25 slices shown (2 of 2)]
[im 1/25]
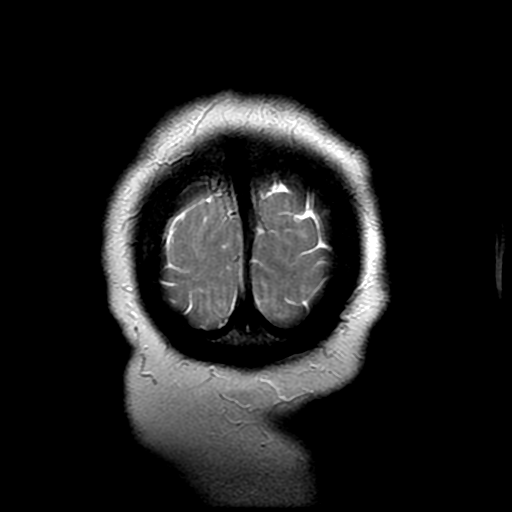
[im 25/25]
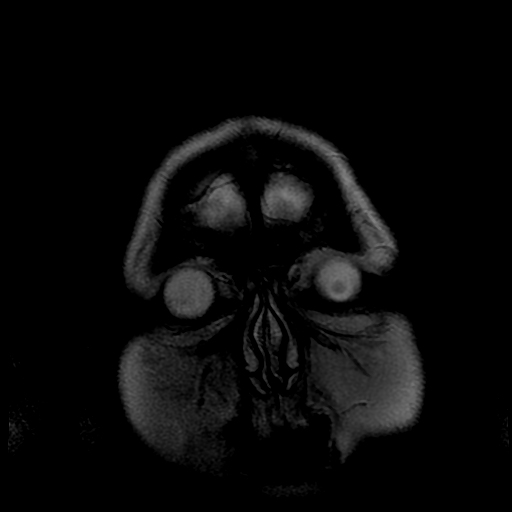

[Series 300: DWI · axial · 5.0mm · 1.09mm/px · z∈[-72,+72]mm · 3 of 30 slices shown (3 of 4)]
[im 1/30]
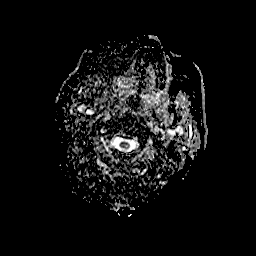
[im 15/30]
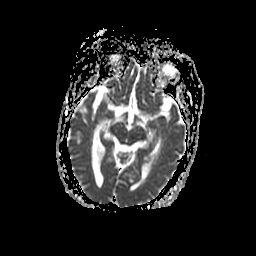
[im 30/30]
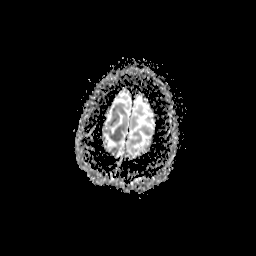

[Series 400: DWI · coronal · 5.0mm · 1.09mm/px · 3 of 33 slices shown (4 of 4)]
[im 1/33]
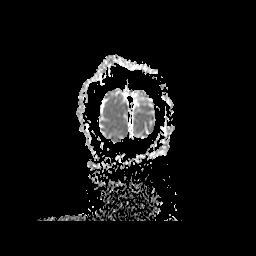
[im 17/33]
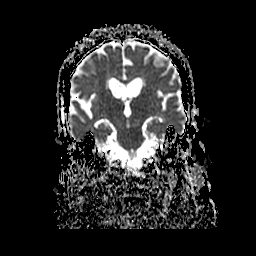
[im 33/33]
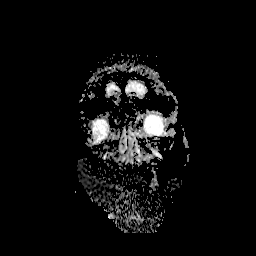

[30 of 48 positions shown; findings below may reference images not displayed]

FINDINGS: MRI HEAD FINDINGS

There is no acute infarct. Small, remote right cerebellar infarct is
noted. Patchy T2 hyperintensities in the subcortical and deep
cerebral white matter are nonspecific but compatible with mild to
moderate chronic small vessel ischemic disease. Mild to moderate
cerebral atrophy is present. There is no evidence of intracranial
hemorrhage, mass, midline shift, or extra-axial fluid collection.

Prior bilateral cataract surgery is noted. Paranasal sinuses are
clear. Small amount of left mastoid fluid is present. Major
intracranial vascular flow voids are preserved.

MRA HEAD FINDINGS

The visualized distal vertebral arteries are patent with the right
being dominant. Right PICA origin is patent. Left PICA is not
identified. AICA origins are patent, with the left being dominant.
Basilar artery is patent without stenosis. SCA origins are patent.
There are essentially fetal origins of the PCAs with hypoplastic P1
segments present bilaterally. PCAs are otherwise unremarkable.

Internal carotid arteries are patent from skullbase to carotid
termini. ACAs and MCAs are unremarkable. No intracranial aneurysm is
identified.
IMPRESSION: 1. No evidence of acute intracranial abnormality.
2. Mild to moderate chronic small vessel ischemic disease and
cerebral atrophy.
3. No evidence of major intracranial arterial occlusion or
significant stenosis.

## 2015-09-16 IMAGING — CR DG CHEST 2V
2 series · 2 of 2 positions shown · non-contrast
Comparison: 07/16/2013

CLINICAL DATA: Stroke, confusion

EXAM:
CHEST  2 VIEW

[w chest lat]
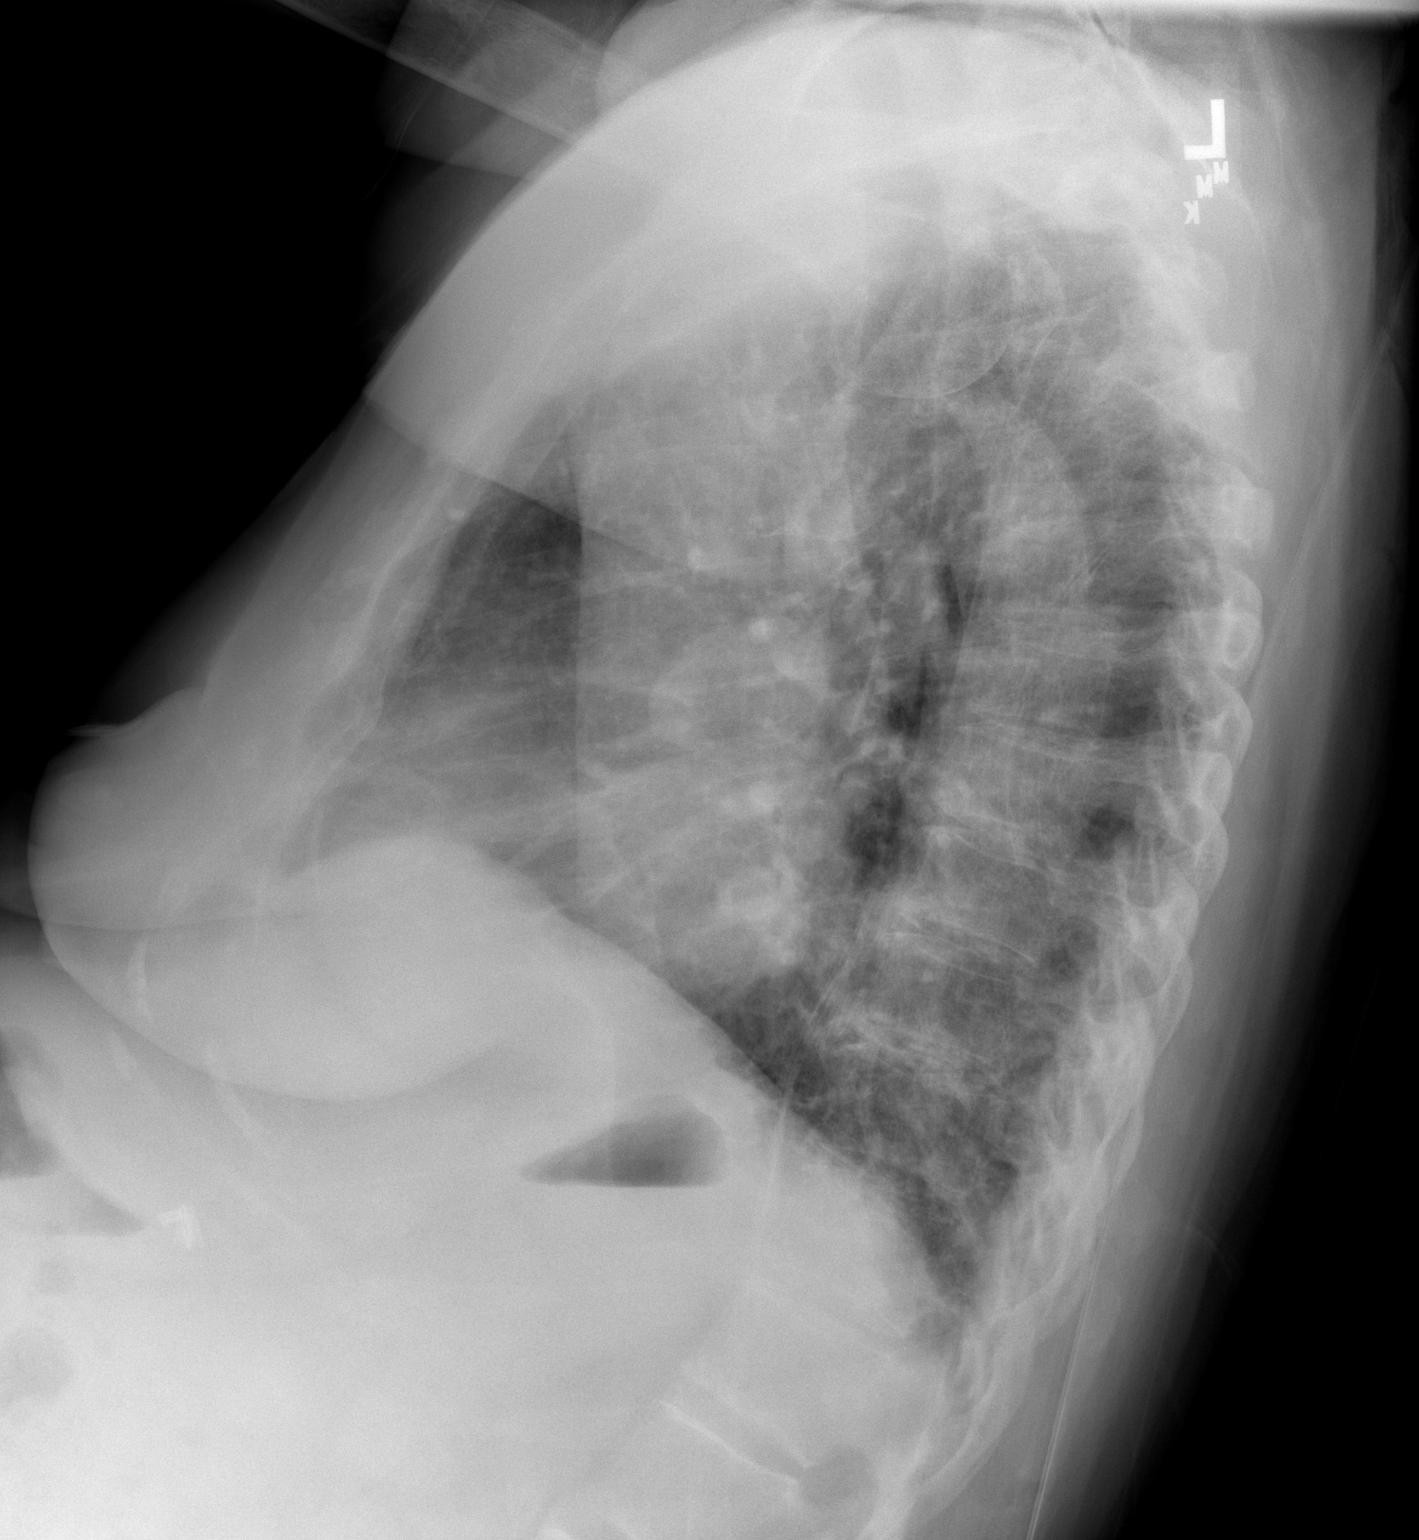

[w chest pa]
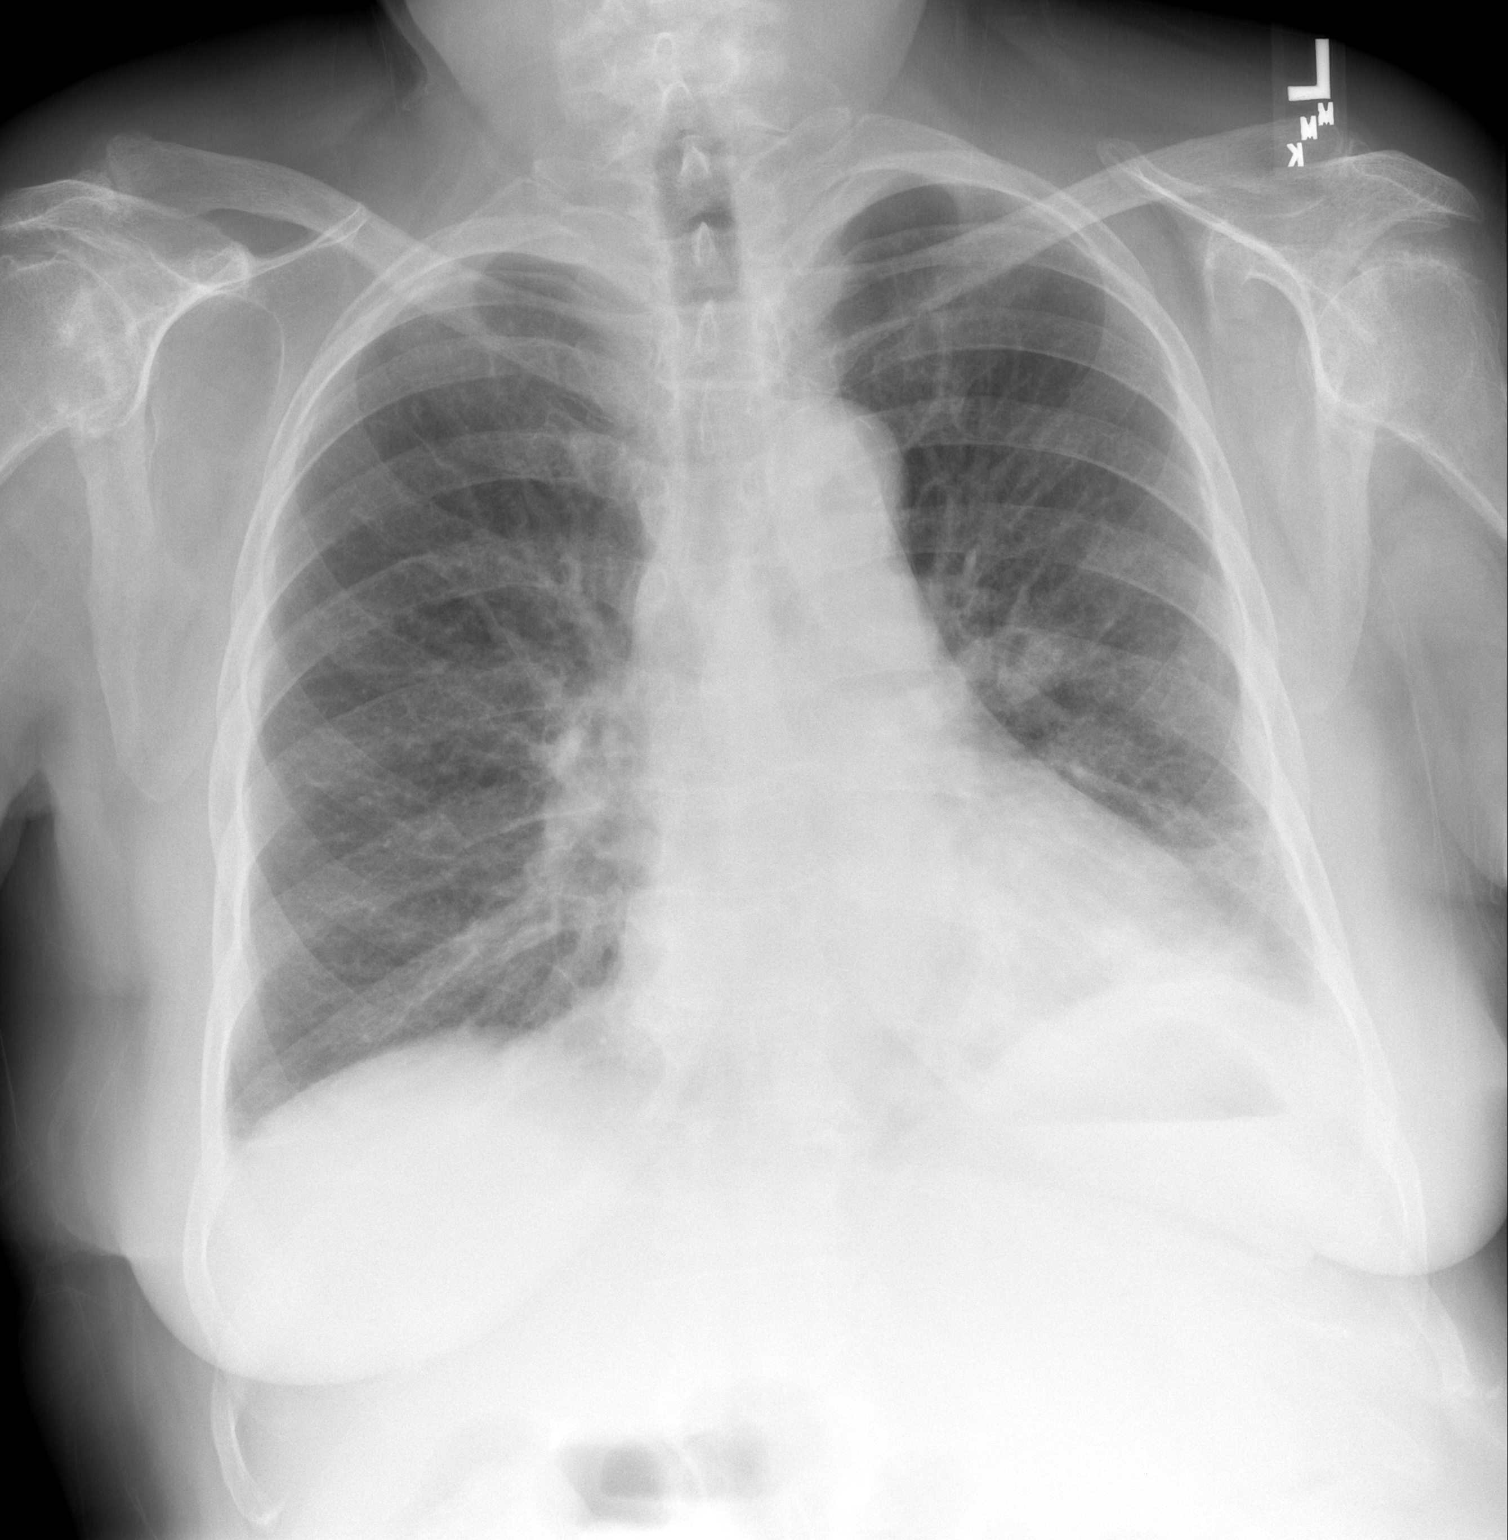

[2 of 2 positions shown; findings below may reference images not displayed]

FINDINGS: Cardiomediastinal silhouette is stable. Mild degenerative changes
thoracic spine. No acute infiltrate or pulmonary edema. Left base
atelectasis or scarring.
IMPRESSION: No acute infiltrate or pulmonary edema. Left basilar atelectasis or
scarring.

## 2015-09-18 DIAGNOSIS — Z6827 Body mass index (BMI) 27.0-27.9, adult: Secondary | ICD-10-CM | POA: Diagnosis not present

## 2015-09-18 DIAGNOSIS — D692 Other nonthrombocytopenic purpura: Secondary | ICD-10-CM | POA: Diagnosis not present

## 2015-09-18 DIAGNOSIS — B372 Candidiasis of skin and nail: Secondary | ICD-10-CM | POA: Diagnosis not present

## 2015-09-24 DIAGNOSIS — N184 Chronic kidney disease, stage 4 (severe): Secondary | ICD-10-CM | POA: Diagnosis not present

## 2015-09-24 DIAGNOSIS — N390 Urinary tract infection, site not specified: Secondary | ICD-10-CM | POA: Diagnosis not present

## 2015-09-24 DIAGNOSIS — I509 Heart failure, unspecified: Secondary | ICD-10-CM | POA: Diagnosis not present

## 2015-09-24 DIAGNOSIS — R1030 Lower abdominal pain, unspecified: Secondary | ICD-10-CM | POA: Diagnosis not present

## 2015-09-24 DIAGNOSIS — M1990 Unspecified osteoarthritis, unspecified site: Secondary | ICD-10-CM | POA: Diagnosis not present

## 2015-09-25 ENCOUNTER — Ambulatory Visit
Admission: RE | Admit: 2015-09-25 | Discharge: 2015-09-25 | Disposition: A | Discharge status: Home / Self Care | Payer: Medicare Other | Source: Ambulatory Visit | Attending: Registered Nurse | Admitting: Registered Nurse

## 2015-09-25 ENCOUNTER — Other Ambulatory Visit: Payer: Self-pay | Admitting: Registered Nurse

## 2015-09-25 DIAGNOSIS — R1032 Left lower quadrant pain: Secondary | ICD-10-CM

## 2015-09-25 DIAGNOSIS — R1084 Generalized abdominal pain: Secondary | ICD-10-CM

## 2015-09-25 DIAGNOSIS — R1030 Lower abdominal pain, unspecified: Secondary | ICD-10-CM | POA: Diagnosis not present

## 2015-10-08 DIAGNOSIS — E784 Other hyperlipidemia: Secondary | ICD-10-CM | POA: Diagnosis not present

## 2015-10-08 DIAGNOSIS — R8299 Other abnormal findings in urine: Secondary | ICD-10-CM | POA: Diagnosis not present

## 2015-10-08 DIAGNOSIS — N390 Urinary tract infection, site not specified: Secondary | ICD-10-CM | POA: Diagnosis not present

## 2015-10-08 DIAGNOSIS — M859 Disorder of bone density and structure, unspecified: Secondary | ICD-10-CM | POA: Diagnosis not present

## 2015-10-08 DIAGNOSIS — E119 Type 2 diabetes mellitus without complications: Secondary | ICD-10-CM | POA: Diagnosis not present

## 2015-10-13 DIAGNOSIS — Z79899 Other long term (current) drug therapy: Secondary | ICD-10-CM | POA: Diagnosis not present

## 2015-10-15 DIAGNOSIS — N390 Urinary tract infection, site not specified: Secondary | ICD-10-CM | POA: Diagnosis not present

## 2015-10-15 DIAGNOSIS — Z0000 Encounter for general adult medical examination without abnormal findings: Secondary | ICD-10-CM | POA: Diagnosis not present

## 2015-10-15 DIAGNOSIS — I130 Hypertensive heart and chronic kidney disease with heart failure and stage 1 through stage 4 chronic kidney disease, or unspecified chronic kidney disease: Secondary | ICD-10-CM | POA: Diagnosis not present

## 2015-10-15 DIAGNOSIS — D692 Other nonthrombocytopenic purpura: Secondary | ICD-10-CM | POA: Diagnosis not present

## 2015-10-15 DIAGNOSIS — I25118 Atherosclerotic heart disease of native coronary artery with other forms of angina pectoris: Secondary | ICD-10-CM | POA: Diagnosis not present

## 2015-10-15 DIAGNOSIS — N183 Chronic kidney disease, stage 3 (moderate): Secondary | ICD-10-CM | POA: Diagnosis not present

## 2015-10-15 DIAGNOSIS — R918 Other nonspecific abnormal finding of lung field: Secondary | ICD-10-CM | POA: Diagnosis not present

## 2015-10-15 DIAGNOSIS — F258 Other schizoaffective disorders: Secondary | ICD-10-CM | POA: Diagnosis not present

## 2015-10-15 DIAGNOSIS — R8299 Other abnormal findings in urine: Secondary | ICD-10-CM | POA: Diagnosis not present

## 2015-10-15 DIAGNOSIS — D472 Monoclonal gammopathy: Secondary | ICD-10-CM | POA: Diagnosis not present

## 2015-10-15 DIAGNOSIS — Z6826 Body mass index (BMI) 26.0-26.9, adult: Secondary | ICD-10-CM | POA: Diagnosis not present

## 2015-10-15 DIAGNOSIS — Z1389 Encounter for screening for other disorder: Secondary | ICD-10-CM | POA: Diagnosis not present

## 2015-10-15 DIAGNOSIS — M353 Polymyalgia rheumatica: Secondary | ICD-10-CM | POA: Diagnosis not present

## 2015-10-15 DIAGNOSIS — I509 Heart failure, unspecified: Secondary | ICD-10-CM | POA: Diagnosis not present

## 2015-10-22 DIAGNOSIS — Z1212 Encounter for screening for malignant neoplasm of rectum: Secondary | ICD-10-CM | POA: Diagnosis not present

## 2015-10-24 ENCOUNTER — Encounter: Payer: Self-pay | Admitting: Internal Medicine

## 2015-10-24 ENCOUNTER — Ambulatory Visit (INDEPENDENT_AMBULATORY_CARE_PROVIDER_SITE_OTHER): Payer: Medicare Other | Admitting: Internal Medicine

## 2015-10-24 VITALS — BP 130/66 | HR 74 | Ht 63.0 in | Wt 153.0 lb

## 2015-10-24 DIAGNOSIS — I2510 Atherosclerotic heart disease of native coronary artery without angina pectoris: Secondary | ICD-10-CM | POA: Diagnosis not present

## 2015-10-24 DIAGNOSIS — Z8673 Personal history of transient ischemic attack (TIA), and cerebral infarction without residual deficits: Secondary | ICD-10-CM | POA: Diagnosis not present

## 2015-10-24 DIAGNOSIS — I10 Essential (primary) hypertension: Secondary | ICD-10-CM

## 2015-10-24 DIAGNOSIS — M129 Arthropathy, unspecified: Secondary | ICD-10-CM

## 2015-10-24 DIAGNOSIS — M353 Polymyalgia rheumatica: Secondary | ICD-10-CM

## 2015-10-24 NOTE — Patient Instructions (Signed)
Your physician wants you to follow-up in: 6 months with Dr. Hilty. You will receive a reminder letter in the mail two months in advance. If you don't receive a letter, please call our office to schedule the follow-up appointment.    

## 2015-10-24 NOTE — Progress Notes (Signed)
10/24/2015   PCP: [name], MD   Chief Complaint  Patient presents with  . 8 WEEK F/U  . Dizziness    Primary Cardiologist: Dr. Debara Pickett  CC: Follow-up from Richardson Dopp, PA-C  HPI:  Rebecca [name] presents today with her daughter after developing chest pain, Rt sided yesterday.  Pt had walked- her usual exercise in the morning, later while watching TV she developed Rt side chest pain, the pain then moved to mid sternal area then the rt arm.  Her throat felt strange also.  She had mild nausea.  Her daughter called our office and we instructed her to take NTG sl.  The patient was too afraid.  She took a lozenger that helped her throat.  Pain resolved within 15 min.    She has hx. Of CAD with cath in 2006 and rec'd Taxus DES of the mid RCA.  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  Last lexiscan myoview 09/2012 was low risk scan.  Echo 07/2012 EF 55-60%, mild hypokinesis of inf wall,  Grade 1 diastolic dysfunction.  Other hx as below.  With her negative nuc her Imdur had been d/c'd.  She developed chest pain and it was restarted at 30 mg.  Previously she had been on 60 mg.  At her last office visit, [name] started her back on Imdur 60 mg daily.  She now reports her chest pain has resolved.  Her main complaint at this time include ankle pain and some low back pain. She denies any recurrent chest pain. She had recent laboratory work performed at her primary care doctor's office this past week which shows moderate chronic kidney disease with a GFR of 31. No liver enzyme abnormalities. Well controlled cholesterol at total cholesterol 141, triglycerides 114, [name] 51 LDL 67. Hemoglobin A1c 5.5, TSH 1.7. ApoB is 58.  Rebecca [name] follows up today from recent hospitalization where she possibly had a mild TIA. It is not clear however her aspirin was switched over to Plavix. She recently had an episode this past weekend of right-sided chest discomfort that went up to a neck. She was then complaining of  some throat pain. This caused great concern her daughter who state up with her all night but did not note any further symptoms. She did take aspirin with some relief. She's afraid to take nitroglycerin but is on long-acting isosorbide. EKG is normal the office today.  I saw Ms. [name] back in the office today. Recently she had a pneumonia and was treated with Levaquin. Apparently she had some tendinitis from this. She seems to be recovering though very quickly and is also on stair eyes. She's not had any significant chest pain and has not needed to take short-acting nitrates. Her EKG shows normal sinus rhythm today.  10/24/2015  Rebecca [name] returns today for follow-up. She was recently seen in the office by Richardson Dopp, PA-C, which time she felt more swollen. He did not suspect that this was congestive heart failure but ordered some labs and Lasix. She never ended up taking the Lasix as her BNP was low and she was noted to be hyponatremic with rising creatinine. He recommended so dietary changes and fluid restriction. A repeat metabolic profile was then improved. She was noted to have some hyperkalemia they adjusted her diet further which her daughter says that has made a big difference. Rebecca [name] denies any further chest pain. She does note some swelling, particular puffiness of her face which  I suspect is related to long-term steroid use.   Allergies  Allergen Reactions  . Levaquin [Levofloxacin] Other (See Comments)    Ruptured tendon    Current Outpatient Prescriptions  Medication Sig Dispense Refill  . acetaminophen (TYLENOL) 500 MG tablet Take 1,000 mg by mouth 2 (two) times daily. Take 2 tablets (1000 mg) by mouth daily with breakfast, take 2 tablets (1000 mg) at 2pm    . albuterol (PROAIR HFA) 108 (90 Base) MCG/ACT inhaler Inhale 2 puffs into the lungs every 6 (six) hours as needed for wheezing or shortness of breath.    Marland Kitchen alendronate (FOSAMAX) 70 MG tablet Take 1 tablet (70 mg total) by mouth  every 7 (seven) days. Take with a full glass of water on an empty stomach. (Patient taking differently: Take 70 mg by mouth every 7 (seven) days. Take with a full glass of water on an empty stomach. On Saturdays) 4 tablet 5  . cetirizine (ZYRTEC) 10 MG tablet Take 10 mg by mouth at bedtime.     . clopidogrel (PLAVIX) 75 MG tablet Take 1 tablet (75 mg total) by mouth daily with breakfast. (Patient taking differently: Take 75 mg by mouth at bedtime. ) 30 tablet 0  . clozapine (CLOZARIL) 200 MG tablet Take 300 mg by mouth at bedtime. ]    . Cranberry (SM CRANBERRY) 300 MG tablet Take 300 mg by mouth at bedtime.     Marland Kitchen diltiazem (CARDIZEM LA) 180 MG 24 hr tablet Take 180 mg by mouth daily with breakfast.  0  . docusate sodium (COLACE) 100 MG capsule Take 200 mg by mouth at bedtime.  10 capsule 0  . ferrous sulfate 325 (65 FE) MG EC tablet Take 325 mg by mouth 2 (two) times daily.      . Fluticasone-Salmeterol (ADVAIR) 250-50 MCG/DOSE AEPB Inhale 1 puff into the lungs at bedtime.    . isosorbide mononitrate (IMDUR) 60 MG 24 hr tablet Take 1 tablet (60 mg total) by mouth daily. (Patient taking differently: Take 60 mg by mouth daily with breakfast. ) 30 tablet 6  . levothyroxine (SYNTHROID, LEVOTHROID) 75 MCG tablet Take 75 mcg by mouth daily before breakfast.    . metoprolol succinate (TOPROL-XL) 100 MG 24 hr tablet Take 100 mg by mouth daily with breakfast. Take with or immediately following a meal.    . Multiple Vitamin (MULTIVITAMIN WITH MINERALS) TABS Take 1 tablet by mouth at bedtime. Psychiatrist    . nitroGLYCERIN (NITROSTAT) 0.4 MG SL tablet Place 1 tablet (0.4 mg total) under the tongue every 5 (five) minutes as needed. For chest pain (Patient taking differently: Place 0.4 mg under the tongue every 5 (five) minutes as needed for chest pain. ) Rebecca tablet 3  . Omega-3 Fatty Acids (FISH OIL PO) Take 1 capsule by mouth at bedtime.    . pantoprazole (PROTONIX) 40 MG tablet Take 1 tablet (40 mg total)  by mouth daily. (Patient taking differently: Take 40 mg by mouth daily before breakfast. ) 30 tablet 11  . Polyethyl Glycol-Propyl Glycol (SYSTANE OP) Place 1 drop into both eyes 2 (two) times daily as needed (dry eyes).     . polyethylene glycol (MIRALAX / GLYCOLAX) packet Take 17 g by mouth daily at 2 PM. Mix in 8 oz liquid and drink    . pravastatin (PRAVACHOL) 20 MG tablet Take 20 mg by mouth at bedtime.    . predniSONE (DELTASONE) 1 MG tablet Take 4 mg by mouth daily with  breakfast. Take with #2 5 mg tablets for a 14 mg dose  0  . predniSONE (DELTASONE) 5 MG tablet Take 10 mg by mouth daily with breakfast. Take with #4 1 mg tablets for a 14 mg dose  0  . temazepam (RESTORIL) 15 MG capsule Take 15 mg by mouth at bedtime as needed for sleep.     . Tiotropium Bromide Monohydrate (SPIRIVA RESPIMAT) 2.5 MCG/ACT AERS Inhale 2 puffs into the lungs daily.    . traMADol (ULTRAM) 50 MG tablet Take 2 tablets (100 mg) by mouth daily at 2pm, may also take 1-2 tablets as needed for pain    . traMADol (ULTRAM-ER) 300 MG 24 hr tablet Take 300 mg by mouth daily with breakfast.     . Vitamin D, Ergocalciferol, (DRISDOL) 50000 UNITS CAPS capsule Take 1 capsule (50,000 Units total) by mouth every 7 (seven) days. (Patient taking differently: Take 50,000 Units by mouth every 7 (seven) days. Fridays) 4 capsule 0   No current facility-administered medications for this visit.    Past Medical History  Diagnosis Date  . Diabetes mellitus   . Frozen shoulder     right  . Obesity   . Renal insufficiency   . COPD (chronic obstructive pulmonary disease) ([place])   . Shortness of breath   . Stroke ([place])   . CAD (coronary artery disease) 2006    Taxus stent of mid RCA. negative myoview 2014 and normal LV function  . Diastolic heart failure, NYHA class 1 ([place])   . Polymyalgia rheumatica ([place])   . 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 Dysuria 7/Rebecca/2016    Past Surgical History  Procedure  Laterality Date  . Partial hysterectomy  1995  . Abdominal hysterectomy    . Cholecystectomy    . Coronary angioplasty with stent placement  2006    Taxus DES to RCA, 50 % residual  . Cardiac catheterization N/A 08/13/2015    Procedure: Left Heart Cath and Coronary Angiography;  Surgeon: Jettie Booze, MD;  Location: [facility];  Service: Cardiovascular;  Laterality: N/A;    [id number] colds or fevers, no weight changes Skin:no rashes or ulcers HEENT:no blurred vision, no congestion CV:see HPI PUL:see HPI GI:no diarrhea constipation or melena, no indigestion GU:no hematuria, no dysuria MS:+ joint pain, no claudication Neuro:no syncope, no lightheadedness Endo:+ diabetes, + thyroid disease  PHYSICAL EXAM BP 130/66 mmHg  Pulse 74  Ht 5\' 3"  (1.6 m)  Wt 153 lb (69.4 kg)  BMI 27.11 kg/m2 GEN: Awake, NAD, moon facies HEENT: PERRLA, EOMI LUNGS: CLEAR ABD: S/NT, +BS EXT: No edema NEURO: Grossly intact PSYCH: Normal mood, affect  EKG: Deferred  ASSESSMENT AND PLAN Patient Active Problem List   Diagnosis Date Noted  . Right hip pain   . CKD stage 4, GFR 15-29 ml/min  08/11/2015  . Chest pain with moderate risk of acute coronary syndrome 08/10/2015  . Dysuria 07/Rebecca/2016  . Tendonitis, Achilles 07/Rebecca/2016  . 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 Leukocytosis 01/28/2014  . Memory loss 11/26/2013  . History of TIA - May 2015- Plavix 10/16/2013  . COPD exacerbation ([place]) 07/16/2013  . Snoring 06/29/2013  . Diabetes mellitus, type 2 ([place]) 06/29/2013  . Estrogen deficiency 02/06/2013  . Bruising 01/02/2013  . Osteoporosis screening 01/02/2013  . Breast cancer screening 01/02/2013  . Personal history of fall 10/19/2012  . Polymyalgia rheumatica ([place]) 10/03/2012  . Weakness 09/27/2012  . Trochanteric bursitis of right hip  04/11/2012  . At high risk for falls 02/28/2012  . Lower urinary tract symptoms 01/27/2012  . Chronic  constipation 01/26/2012  . Memory impairment 12/01/2010  . History of small bowel obstruction 11/04/2010  . COPD, MILD 07/09/2010  . 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 CONSTIPATION, CHRONIC 12/12/2008  . PRURITUS 03/06/2008  . Schizoaffective disorder ([place]) 01/10/2008  . GOUT 09/11/2007  . LOW BACK PAIN, CHRONIC 07/05/2007  . Dyslipidemia 01/25/2007  . Hypothyroidism 08/11/2006  . OBESITY, NOS 08/11/2006  . Anemia in chronic renal disease 08/11/2006  . Essential hypertension 08/11/2006  . Coronary atherosclerosis 08/11/2006  . HEMORRHOIDS, NOS 08/11/2006  . GASTROESOPHAGEAL REFLUX, NO ESOPHAGITIS 08/11/2006  . RENAL INSUFFICIENCY, CHRONIC 08/11/2006  . Arthropathy 08/11/2006   PLAN: 1.  Rebecca [name] appears to be stable from a coronary standpoint. She had recent heart catheterization which forcefully showed no new obstructive coronary disease. Her chest pain is resolved. Her rheumatologist is working on steroid tapers however with her PMR and arthritis this is been difficult. She does have significant secondary effects of the steroids including facial swelling. Labs appear to be stable and I do not feel her to roll for furosemide. She has a follow-up with her nephrologist in a few weeks ago and further reassess that.   Pixie Casino, MD, [facility] Attending Cardiologist [place]

## 2015-10-27 DIAGNOSIS — N183 Chronic kidney disease, stage 3 (moderate): Secondary | ICD-10-CM | POA: Diagnosis not present

## 2015-10-27 DIAGNOSIS — M7989 Other specified soft tissue disorders: Secondary | ICD-10-CM | POA: Diagnosis not present

## 2015-10-27 DIAGNOSIS — D472 Monoclonal gammopathy: Secondary | ICD-10-CM | POA: Diagnosis not present

## 2015-10-27 DIAGNOSIS — Z7952 Long term (current) use of systemic steroids: Secondary | ICD-10-CM | POA: Diagnosis not present

## 2015-10-27 DIAGNOSIS — M150 Primary generalized (osteo)arthritis: Secondary | ICD-10-CM | POA: Diagnosis not present

## 2015-10-27 DIAGNOSIS — M353 Polymyalgia rheumatica: Secondary | ICD-10-CM | POA: Diagnosis not present

## 2015-10-27 DIAGNOSIS — M810 Age-related osteoporosis without current pathological fracture: Secondary | ICD-10-CM | POA: Diagnosis not present

## 2015-11-07 DIAGNOSIS — Z79899 Other long term (current) drug therapy: Secondary | ICD-10-CM | POA: Diagnosis not present

## 2015-12-10 DIAGNOSIS — N183 Chronic kidney disease, stage 3 (moderate): Secondary | ICD-10-CM | POA: Diagnosis not present

## 2015-12-10 DIAGNOSIS — N390 Urinary tract infection, site not specified: Secondary | ICD-10-CM | POA: Diagnosis not present

## 2015-12-10 DIAGNOSIS — M353 Polymyalgia rheumatica: Secondary | ICD-10-CM | POA: Diagnosis not present

## 2015-12-10 DIAGNOSIS — E663 Overweight: Secondary | ICD-10-CM | POA: Diagnosis not present

## 2015-12-10 DIAGNOSIS — I129 Hypertensive chronic kidney disease with stage 1 through stage 4 chronic kidney disease, or unspecified chronic kidney disease: Secondary | ICD-10-CM | POA: Diagnosis not present

## 2015-12-10 DIAGNOSIS — M810 Age-related osteoporosis without current pathological fracture: Secondary | ICD-10-CM | POA: Diagnosis not present

## 2015-12-12 DIAGNOSIS — Z79899 Other long term (current) drug therapy: Secondary | ICD-10-CM | POA: Diagnosis not present

## 2015-12-12 DIAGNOSIS — N183 Chronic kidney disease, stage 3 (moderate): Secondary | ICD-10-CM | POA: Diagnosis not present

## 2015-12-18 DIAGNOSIS — F250 Schizoaffective disorder, bipolar type: Secondary | ICD-10-CM | POA: Diagnosis not present

## 2015-12-30 ENCOUNTER — Other Ambulatory Visit: Payer: Self-pay | Admitting: *Deleted

## 2015-12-30 ENCOUNTER — Other Ambulatory Visit (HOSPITAL_BASED_OUTPATIENT_CLINIC_OR_DEPARTMENT_OTHER): Payer: Medicare Other

## 2015-12-30 DIAGNOSIS — D472 Monoclonal gammopathy: Secondary | ICD-10-CM | POA: Diagnosis not present

## 2015-12-30 LAB — CBC WITH DIFFERENTIAL/PLATELET
BASO%: 0.1 % (ref 0.0–2.0)
Basophils Absolute: 0 10*3/uL (ref 0.0–0.1)
EOS ABS: 0.1 10*3/uL (ref 0.0–0.5)
EOS%: 0.5 % (ref 0.0–7.0)
HEMATOCRIT: 32.4 % — AB (ref 34.8–46.6)
HEMOGLOBIN: 10.8 g/dL — AB (ref 11.6–15.9)
LYMPH%: 6.3 % — AB (ref 14.0–49.7)
MCH: 31.3 pg (ref 25.1–34.0)
MCHC: 33.3 g/dL (ref 31.5–36.0)
MCV: 93.9 fL (ref 79.5–101.0)
MONO#: 0.6 10*3/uL (ref 0.1–0.9)
MONO%: 3.6 % (ref 0.0–14.0)
NEUT%: 89.5 % — ABNORMAL HIGH (ref 38.4–76.8)
NEUTROS ABS: 15.4 10*3/uL — AB (ref 1.5–6.5)
PLATELETS: 181 10*3/uL (ref 145–400)
RBC: 3.45 10*6/uL — AB (ref 3.70–5.45)
RDW: 13.2 % (ref 11.2–14.5)
WBC: 17.2 10*3/uL — AB (ref 3.9–10.3)
lymph#: 1.1 10*3/uL (ref 0.9–3.3)

## 2015-12-30 LAB — COMPREHENSIVE METABOLIC PANEL
ALBUMIN: 3.4 g/dL — AB (ref 3.5–5.0)
ALK PHOS: 36 U/L — AB (ref 40–150)
ALT: 22 U/L (ref 0–55)
ANION GAP: 10 meq/L (ref 3–11)
AST: 16 U/L (ref 5–34)
BILIRUBIN TOTAL: 0.31 mg/dL (ref 0.20–1.20)
BUN: 47.4 mg/dL — ABNORMAL HIGH (ref 7.0–26.0)
CALCIUM: 9.7 mg/dL (ref 8.4–10.4)
CO2: 24 mEq/L (ref 22–29)
CREATININE: 2.1 mg/dL — AB (ref 0.6–1.1)
Chloride: 102 mEq/L (ref 98–109)
EGFR: 22 mL/min/{1.73_m2} — ABNORMAL LOW (ref >90)
Glucose: 202 mg/dl — ABNORMAL HIGH (ref 70–140)
Potassium: 5.3 mEq/L — ABNORMAL HIGH (ref 3.5–5.1)
Sodium: 136 mEq/L (ref 136–145)
TOTAL PROTEIN: 6.4 g/dL (ref 6.4–8.3)

## 2015-12-30 LAB — LACTATE DEHYDROGENASE: LDH: 189 U/L (ref 125–245)

## 2015-12-31 LAB — KAPPA/LAMBDA LIGHT CHAINS
IG KAPPA FREE LIGHT CHAIN: 23.5 mg/L — AB (ref 3.3–19.4)
IG LAMBDA FREE LIGHT CHAIN: 21 mg/L (ref 5.7–26.3)
Kappa/Lambda FluidC Ratio: 1.12 (ref 0.26–1.65)

## 2016-01-01 ENCOUNTER — Encounter: Payer: Self-pay | Admitting: Hematology and Oncology

## 2016-01-01 ENCOUNTER — Other Ambulatory Visit: Payer: Self-pay | Admitting: Hematology and Oncology

## 2016-01-01 DIAGNOSIS — E875 Hyperkalemia: Secondary | ICD-10-CM

## 2016-01-01 HISTORY — DX: Hyperkalemia: E87.5

## 2016-01-01 LAB — MULTIPLE MYELOMA PANEL, SERUM
Albumin SerPl Elph-Mcnc: 3.3 g/dL (ref 2.9–4.4)
Albumin/Glob SerPl: 1.4 (ref 0.7–1.7)
Alpha 1: 0.2 g/dL (ref 0.0–0.4)
Alpha2 Glob SerPl Elph-Mcnc: 0.8 g/dL (ref 0.4–1.0)
B-Globulin SerPl Elph-Mcnc: 0.9 g/dL (ref 0.7–1.3)
Gamma Glob SerPl Elph-Mcnc: 0.5 g/dL (ref 0.4–1.8)
Globulin, Total: 2.4 g/dL (ref 2.2–3.9)
IgA, Qn, Serum: 75 mg/dL (ref 64–422)
IgG, Qn, Serum: 517 mg/dL — ABNORMAL LOW (ref 700–1600)
IgM, Qn, Serum: 36 mg/dL (ref 26–217)
Total Protein: 5.7 g/dL — ABNORMAL LOW (ref 6.0–8.5)

## 2016-01-02 ENCOUNTER — Telehealth: Payer: Self-pay | Admitting: Hematology and Oncology

## 2016-01-02 ENCOUNTER — Other Ambulatory Visit: Payer: Self-pay | Admitting: *Deleted

## 2016-01-02 ENCOUNTER — Telehealth: Payer: Self-pay | Admitting: *Deleted

## 2016-01-02 NOTE — Telephone Encounter (Signed)
-----   Message from Heath Lark, MD sent at 01/01/2016  2:00 PM EDT ----- Regarding: high potassium Please let her know potassium a bit high Recommend avoid potassium rich diet and repeat next Tues PLease place lab appt prior to seeing me. I will order lab

## 2016-01-02 NOTE — Telephone Encounter (Signed)
Daughter notified of message below 

## 2016-01-02 NOTE — Telephone Encounter (Signed)
Added lab per pof, pt aware

## 2016-01-05 DIAGNOSIS — D472 Monoclonal gammopathy: Secondary | ICD-10-CM | POA: Diagnosis not present

## 2016-01-06 ENCOUNTER — Ambulatory Visit (HOSPITAL_BASED_OUTPATIENT_CLINIC_OR_DEPARTMENT_OTHER): Payer: Medicare Other | Admitting: Hematology and Oncology

## 2016-01-06 ENCOUNTER — Telehealth: Payer: Self-pay | Admitting: Hematology and Oncology

## 2016-01-06 ENCOUNTER — Other Ambulatory Visit (HOSPITAL_BASED_OUTPATIENT_CLINIC_OR_DEPARTMENT_OTHER): Payer: Medicare Other

## 2016-01-06 VITALS — BP 136/66 | HR 70 | Temp 98.1°F | Resp 18 | Ht 63.0 in | Wt 155.6 lb

## 2016-01-06 DIAGNOSIS — D472 Monoclonal gammopathy: Secondary | ICD-10-CM | POA: Diagnosis not present

## 2016-01-06 DIAGNOSIS — E099 Drug or chemical induced diabetes mellitus without complications: Secondary | ICD-10-CM | POA: Diagnosis not present

## 2016-01-06 DIAGNOSIS — T380X5A Adverse effect of glucocorticoids and synthetic analogues, initial encounter: Secondary | ICD-10-CM

## 2016-01-06 DIAGNOSIS — M353 Polymyalgia rheumatica: Secondary | ICD-10-CM | POA: Diagnosis not present

## 2016-01-06 DIAGNOSIS — N189 Chronic kidney disease, unspecified: Secondary | ICD-10-CM

## 2016-01-06 DIAGNOSIS — D631 Anemia in chronic kidney disease: Secondary | ICD-10-CM

## 2016-01-06 DIAGNOSIS — N184 Chronic kidney disease, stage 4 (severe): Secondary | ICD-10-CM

## 2016-01-06 DIAGNOSIS — E875 Hyperkalemia: Secondary | ICD-10-CM

## 2016-01-06 LAB — KAPPA/LAMBDA LIGHT CHAINS, FREE, WITH RATIO, 24HR. URINE
FR KAPPA LT CH,24HR: 28 mg/24 hr
FR LAMBDA LT CH,24HR: 2 mg/(24.h)
FREE LAMBDA LT CHAINS, UR: 0.85 mg/L (ref 0.24–6.66)
Free Kappa Lt Chains,Ur: 11.5 mg/L (ref 1.35–24.19)
KAPPA/LAMBDA RATIO, U: 13.53 — AB (ref 2.04–10.37)

## 2016-01-06 LAB — POTASSIUM (CC13): POTASSIUM: 5 meq/L (ref 3.5–5.1)

## 2016-01-06 NOTE — Telephone Encounter (Signed)
Pt will get apt on mychart °

## 2016-01-06 NOTE — Assessment & Plan Note (Signed)
This is likely anemia of chronic disease. The patient denies recent history of bleeding such as epistaxis, hematuria or hematochezia. She is asymptomatic from the anemia. We will observe for now.  She will unlikely benefit from oral iron supplement If her hemoglobin drops under 10g, consider ESA

## 2016-01-06 NOTE — Assessment & Plan Note (Signed)
She is Cushingoid She has recent hyperglycemia, diet controlled only In view of borderline kidney function, I recommend aggressive management of risk factors She needs close follow-up with her primary doctor and consideration for medications for diabetes, especially if she is to remain on prednisone long term

## 2016-01-06 NOTE — Assessment & Plan Note (Signed)
she will continue current medical management. I recommend close follow-up with primary care doctor and nephrologist for medication adjustment.  

## 2016-01-06 NOTE — Progress Notes (Signed)
[facility] OFFICE PROGRESS NOTE  Patient Care Team: [name], MD as PCP - General (Internal Medicine) Corliss Parish, MD as Consulting Physician (Nephrology) Pixie Casino, MD as Consulting Physician (Cardiology)  SUMMARY OF ONCOLOGIC HISTORY:  Rebecca [name] is here because of recently discovered IgG lambda MGUS. Patient herself appears to have some memory deficit. A lot of history was obtained through a review of chart and collaboration of history with her daughter She denies history of abnormal bone pain or bone fracture. The patient had history of polymyalgia rheumatica and had been on intermittent high-dose prednisone therapy in the past, currently on a slow prednisone taper. Patient denies history of recurrent infection or atypical infections such as shingles of meningitis. Denies chills, night sweats, anorexia or abnormal weight loss. She had chronic bruising from antiplatelet agent and prednisone therapy. She has occasional epistaxis but she thought to be related to the use of CPAP.   INTERVAL HISTORY: Please see below for problem oriented charting. She had recent cardiac evaluation for chest pain, currently on medical management She had recent flare of PMR She has easy bruising Has chronic hip pain  REVIEW OF SYSTEMS:   Constitutional: Denies fevers, chills or abnormal weight loss Eyes: Denies blurriness of vision Ears, nose, mouth, throat, and face: Denies mucositis or sore throat Respiratory: Denies cough, dyspnea or wheezes Cardiovascular: Denies palpitation, chest discomfort or lower extremity swelling Gastrointestinal:  Denies nausea, heartburn or change in bowel habits Skin: Denies abnormal skin rashes Lymphatics: Denies new lymphadenopathy  Neurological:Denies numbness, tingling or new weaknesses Behavioral/Psych: Mood is stable, no new changes  All other systems were reviewed with the patient and are negative.  I have reviewed the past  medical history, past surgical history, social history and family history with the patient and they are unchanged from previous note.  ALLERGIES:  is allergic to levaquin [levofloxacin].  MEDICATIONS:  Current Outpatient Prescriptions  Medication Sig Dispense Refill  . acetaminophen (TYLENOL) 500 MG tablet Take 1,000 mg by mouth 2 (two) times daily. Take 2 tablets (1000 mg) by mouth daily with breakfast, take 2 tablets (1000 mg) at 2pm    . albuterol (PROAIR HFA) 108 (90 Base) MCG/ACT inhaler Inhale 2 puffs into the lungs every 6 (six) hours as needed for wheezing or shortness of breath.    Marland Kitchen alendronate (FOSAMAX) 70 MG tablet Take 1 tablet (70 mg total) by mouth every 7 (seven) days. Take with a full glass of water on an empty stomach. (Patient taking differently: Take 70 mg by mouth every 7 (seven) days. Take with a full glass of water on an empty stomach. On Saturdays) 4 tablet 5  . cetirizine (ZYRTEC) 10 MG tablet Take 10 mg by mouth at bedtime.     . clopidogrel (PLAVIX) 75 MG tablet Take 1 tablet (75 mg total) by mouth daily with breakfast. (Patient taking differently: Take 75 mg by mouth at bedtime. ) 30 tablet 0  . clozapine (CLOZARIL) 200 MG tablet Take 300 mg by mouth at bedtime. ]    . Cranberry (SM CRANBERRY) 300 MG tablet Take 300 mg by mouth at bedtime.     Marland Kitchen diltiazem (CARDIZEM LA) 180 MG 24 hr tablet Take 180 mg by mouth daily with breakfast.  0  . docusate sodium (COLACE) 100 MG capsule Take 200 mg by mouth at bedtime.  10 capsule 0  . ferrous sulfate 325 (65 FE) MG EC tablet Take 325 mg by mouth 2 (two) times daily.      Marland Kitchen  Fluticasone-Salmeterol (ADVAIR) 250-50 MCG/DOSE AEPB Inhale 1 puff into the lungs at bedtime.    . isosorbide mononitrate (IMDUR) 60 MG 24 hr tablet Take 1 tablet (60 mg total) by mouth daily. (Patient taking differently: Take 60 mg by mouth daily with breakfast. ) 30 tablet 6  . levothyroxine (SYNTHROID, LEVOTHROID) 75 MCG tablet Take 75 mcg by mouth daily  before breakfast.    . metoprolol succinate (TOPROL-XL) 100 MG 24 hr tablet Take 100 mg by mouth daily with breakfast. Take with or immediately following a meal.    . Multiple Vitamin (MULTIVITAMIN WITH MINERALS) TABS Take 1 tablet by mouth at bedtime. Psychiatrist    . nitroGLYCERIN (NITROSTAT) 0.4 MG SL tablet Place 1 tablet (0.4 mg total) under the tongue every 5 (five) minutes as needed. For chest pain (Patient taking differently: Place 0.4 mg under the tongue every 5 (five) minutes as needed for chest pain. ) 25 tablet 3  . Omega-3 Fatty Acids (FISH OIL PO) Take 1 capsule by mouth at bedtime.    . pantoprazole (PROTONIX) 40 MG tablet Take 1 tablet (40 mg total) by mouth daily. (Patient taking differently: Take 40 mg by mouth daily before breakfast. ) 30 tablet 11  . Polyethyl Glycol-Propyl Glycol (SYSTANE OP) Place 1 drop into both eyes 2 (two) times daily as needed (dry eyes).     . polyethylene glycol (MIRALAX / GLYCOLAX) packet Take 17 g by mouth daily at 2 PM. Mix in 8 oz liquid and drink    . pravastatin (PRAVACHOL) 20 MG tablet Take 20 mg by mouth at bedtime.    . predniSONE (DELTASONE) 5 MG tablet Take 10 mg by mouth daily with breakfast. Take with #4 1 mg tablets for a 14 mg dose  0  . temazepam (RESTORIL) 15 MG capsule Take 15 mg by mouth at bedtime as needed for sleep.     . Tiotropium Bromide Monohydrate (SPIRIVA RESPIMAT) 2.5 MCG/ACT AERS Inhale 2 puffs into the lungs daily.    . traMADol (ULTRAM) 50 MG tablet Take 2 tablets (100 mg) by mouth daily at 2pm, may also take 1-2 tablets as needed for pain    . traMADol (ULTRAM-ER) 300 MG 24 hr tablet Take 300 mg by mouth daily with breakfast.     . Vitamin D, Ergocalciferol, (DRISDOL) 50000 UNITS CAPS capsule Take 1 capsule (50,000 Units total) by mouth every 7 (seven) days. (Patient taking differently: Take 50,000 Units by mouth every 7 (seven) days. Fridays) 4 capsule 0   No current facility-administered medications for this visit.      PHYSICAL EXAMINATION: ECOG PERFORMANCE STATUS: 2 - Symptomatic, <50% confined to bed  Vitals:   01/06/16 1525  BP: 136/66  Pulse: 70  Resp: 18  Temp: 98.1 F (36.7 C)   Filed Weights   01/06/16 1525  Weight: 155 lb 9.6 oz (70.6 kg)    GENERAL:alert, no distress and comfortable. She is obese and Cushingoid SKIN: Noted thin skin and extensive skin bruising EYES: normal, Conjunctiva are pink and non-injected, sclera clear OROPHARYNX:no exudate, no erythema and lips, buccal mucosa, and tongue normal  NECK: supple, thyroid normal size, non-tender, without nodularity LYMPH:  no palpable lymphadenopathy in the cervical, axillary or inguinal LUNGS: clear to auscultation and percussion with normal breathing effort HEART: regular rate & rhythm and no murmurs and no lower extremity edema ABDOMEN:abdomen soft, non-tender and normal bowel sounds Musculoskeletal:no cyanosis of digits and no clubbing  NEURO: alert & oriented x 3 with  fluent speech, no focal motor/sensory deficits  LABORATORY DATA:  I have reviewed the data as listed    Component Value Date/Time   NA 136 12/30/2015 1514   K 5.0 01/06/2016 1512   CL 99 08/27/2015 1349   CO2 24 12/30/2015 1514   GLUCOSE 202 (H) 12/30/2015 1514   BUN 47.4 (H) 12/30/2015 1514   CREATININE 2.1 (H) 12/30/2015 1514   CALCIUM 9.7 12/30/2015 1514   PROT 5.7 (L) 12/30/2015 1514   PROT 6.4 12/30/2015 1514   ALBUMIN 3.4 (L) 12/30/2015 1514   AST 16 12/30/2015 1514   ALT 22 12/30/2015 1514   ALKPHOS 36 (L) 12/30/2015 1514   BILITOT 0.31 12/30/2015 1514   GFRNONAA 34 (L) 08/14/2015 0345   GFRAA 39 (L) 08/14/2015 0345    No results found for: SPEP, UPEP  Lab Results  Component Value Date   WBC 17.2 (H) 12/30/2015   NEUTROABS 15.4 (H) 12/30/2015   HGB 10.8 (L) 12/30/2015   HCT 32.4 (L) 12/30/2015   MCV 93.9 12/30/2015   PLT 181 12/30/2015      Chemistry      Component Value Date/Time   NA 136 12/30/2015 1514   K 5.0  01/06/2016 1512   CL 99 08/27/2015 1349   CO2 24 12/30/2015 1514   BUN 47.4 (H) 12/30/2015 1514   CREATININE 2.1 (H) 12/30/2015 1514   GLU 77 06/21/2012      Component Value Date/Time   CALCIUM 9.7 12/30/2015 1514   ALKPHOS 36 (L) 12/30/2015 1514   AST 16 12/30/2015 1514   ALT 22 12/30/2015 1514   BILITOT 0.31 12/30/2015 1514       ASSESSMENT & PLAN:  MGUS (monoclonal gammopathy of unknown significance) Clinically, she does not appear to have signs and symptoms to suggest multiple myeloma. 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 I would recommend rechecking her blood work and exam annually I spent a lot of time talking to her daughter the pathophysiology of monoclonal gammopathy and the natural history of disease progression. I addressed all her questions and concerns    Anemia in chronic renal disease This is likely anemia of chronic disease. The patient denies recent history of bleeding such as epistaxis, hematuria or hematochezia. She is asymptomatic from the anemia. We will observe for now.  She will unlikely benefit from oral iron supplement If her hemoglobin drops under 10g, consider ESA   Polymyalgia rheumatica (HCC) She had recent flare on this and had recent prednisone dose increased to 15 mg She is currently on a taper course I will defer to primary doctor for medication adjustment  CKD stage 4, GFR 15-29 ml/min  she will continue current medical management. I recommend close follow-up with primary care doctor and nephrologist for medication adjustment.   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  CBC with Differential/Platelet    Standing Status:   Future    Standing Expiration Date:   02/09/2017  . Comprehensive metabolic panel    Standing Status:   Future    Standing Expiration Date:   02/09/2017  . Kappa/lambda light chains    Standing Status:   Future    Standing  Expiration Date:   02/09/2017  . Multiple Myeloma Panel (SPEP&IFE w/QIG)    Standing Status:   Future    Standing Expiration Date:   02/09/2017   All questions were answered. The patient knows to call the clinic with any problems, questions or concerns. No barriers to learning was detected. I spent 25 minutes counseling the patient face to face. The total time spent in the appointment was 30 minutes and more than 50% was on counseling and review of test results     [facility], Niesha Bame, MD 01/06/2016 9:04 PM

## 2016-01-06 NOTE — Assessment & Plan Note (Signed)
Clinically, she does not appear to have signs and symptoms to suggest multiple myeloma. 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 I would recommend rechecking her blood work and exam annually I spent a lot of time talking to her daughter the pathophysiology of monoclonal gammopathy and the natural history of disease progression. I addressed all her questions and concerns

## 2016-01-06 NOTE — Assessment & Plan Note (Signed)
She had recent flare on this and had recent prednisone dose increased to 15 mg She is currently on a taper course I will defer to primary doctor for medication adjustment

## 2016-01-09 ENCOUNTER — Telehealth: Payer: Self-pay | Admitting: *Deleted

## 2016-01-09 NOTE — Telephone Encounter (Signed)
Daughter states pt has pain w/ urination and she asks if the 24 hr urine that was done here last week showed any signs of infection?  Informed her this test does not test for infection.   It would not indicate either way.   Suggest she call PCP to get U/A today.   She verbalized understanding.

## 2016-01-12 DIAGNOSIS — Z79899 Other long term (current) drug therapy: Secondary | ICD-10-CM | POA: Diagnosis not present

## 2016-01-27 DIAGNOSIS — M150 Primary generalized (osteo)arthritis: Secondary | ICD-10-CM | POA: Diagnosis not present

## 2016-01-27 DIAGNOSIS — R350 Frequency of micturition: Secondary | ICD-10-CM | POA: Diagnosis not present

## 2016-01-27 DIAGNOSIS — N183 Chronic kidney disease, stage 3 (moderate): Secondary | ICD-10-CM | POA: Diagnosis not present

## 2016-01-27 DIAGNOSIS — M810 Age-related osteoporosis without current pathological fracture: Secondary | ICD-10-CM | POA: Diagnosis not present

## 2016-01-27 DIAGNOSIS — Z7952 Long term (current) use of systemic steroids: Secondary | ICD-10-CM | POA: Diagnosis not present

## 2016-01-27 DIAGNOSIS — D472 Monoclonal gammopathy: Secondary | ICD-10-CM | POA: Diagnosis not present

## 2016-01-27 DIAGNOSIS — M353 Polymyalgia rheumatica: Secondary | ICD-10-CM | POA: Diagnosis not present

## 2016-01-27 DIAGNOSIS — M7989 Other specified soft tissue disorders: Secondary | ICD-10-CM | POA: Diagnosis not present

## 2016-02-04 DIAGNOSIS — M810 Age-related osteoporosis without current pathological fracture: Secondary | ICD-10-CM | POA: Diagnosis not present

## 2016-02-11 DIAGNOSIS — H524 Presbyopia: Secondary | ICD-10-CM | POA: Diagnosis not present

## 2016-02-11 DIAGNOSIS — H52223 Regular astigmatism, bilateral: Secondary | ICD-10-CM | POA: Diagnosis not present

## 2016-02-11 DIAGNOSIS — H26493 Other secondary cataract, bilateral: Secondary | ICD-10-CM | POA: Diagnosis not present

## 2016-02-11 DIAGNOSIS — Z79899 Other long term (current) drug therapy: Secondary | ICD-10-CM | POA: Diagnosis not present

## 2016-02-11 DIAGNOSIS — H04123 Dry eye syndrome of bilateral lacrimal glands: Secondary | ICD-10-CM | POA: Diagnosis not present

## 2016-02-11 DIAGNOSIS — E119 Type 2 diabetes mellitus without complications: Secondary | ICD-10-CM | POA: Diagnosis not present

## 2016-02-18 DIAGNOSIS — E038 Other specified hypothyroidism: Secondary | ICD-10-CM | POA: Diagnosis not present

## 2016-02-18 DIAGNOSIS — Z6827 Body mass index (BMI) 27.0-27.9, adult: Secondary | ICD-10-CM | POA: Diagnosis not present

## 2016-02-18 DIAGNOSIS — Z23 Encounter for immunization: Secondary | ICD-10-CM | POA: Diagnosis not present

## 2016-02-18 DIAGNOSIS — D638 Anemia in other chronic diseases classified elsewhere: Secondary | ICD-10-CM | POA: Diagnosis not present

## 2016-02-18 DIAGNOSIS — N390 Urinary tract infection, site not specified: Secondary | ICD-10-CM | POA: Diagnosis not present

## 2016-02-18 DIAGNOSIS — D509 Iron deficiency anemia, unspecified: Secondary | ICD-10-CM | POA: Diagnosis not present

## 2016-02-18 DIAGNOSIS — M353 Polymyalgia rheumatica: Secondary | ICD-10-CM | POA: Diagnosis not present

## 2016-02-18 DIAGNOSIS — N184 Chronic kidney disease, stage 4 (severe): Secondary | ICD-10-CM | POA: Diagnosis not present

## 2016-02-18 DIAGNOSIS — R300 Dysuria: Secondary | ICD-10-CM | POA: Diagnosis not present

## 2016-02-18 DIAGNOSIS — I10 Essential (primary) hypertension: Secondary | ICD-10-CM | POA: Diagnosis not present

## 2016-02-25 DIAGNOSIS — R05 Cough: Secondary | ICD-10-CM | POA: Diagnosis not present

## 2016-02-25 DIAGNOSIS — Z6827 Body mass index (BMI) 27.0-27.9, adult: Secondary | ICD-10-CM | POA: Diagnosis not present

## 2016-02-25 DIAGNOSIS — J449 Chronic obstructive pulmonary disease, unspecified: Secondary | ICD-10-CM | POA: Diagnosis not present

## 2016-02-25 DIAGNOSIS — J189 Pneumonia, unspecified organism: Secondary | ICD-10-CM | POA: Diagnosis not present

## 2016-02-25 DIAGNOSIS — N390 Urinary tract infection, site not specified: Secondary | ICD-10-CM | POA: Diagnosis not present

## 2016-02-25 DIAGNOSIS — R509 Fever, unspecified: Secondary | ICD-10-CM | POA: Diagnosis not present

## 2016-03-01 DIAGNOSIS — D692 Other nonthrombocytopenic purpura: Secondary | ICD-10-CM | POA: Diagnosis not present

## 2016-03-01 DIAGNOSIS — L304 Erythema intertrigo: Secondary | ICD-10-CM | POA: Diagnosis not present

## 2016-03-01 DIAGNOSIS — L853 Xerosis cutis: Secondary | ICD-10-CM | POA: Diagnosis not present

## 2016-03-15 DIAGNOSIS — Z79899 Other long term (current) drug therapy: Secondary | ICD-10-CM | POA: Diagnosis not present

## 2016-03-15 IMAGING — CR DG BONE SURVEY MET
10 series · 10 of 10 positions shown · non-contrast
Comparison: None.

CLINICAL DATA: Monoclonal gammopathy of unknown significance.
Patient has left neck soreness since last night. Patient also has
chronic bilateral hip pain, greater on the right, and a frozen right
shoulder. History of chronic kidney disease and COPD.

EXAM:
METASTATIC BONE SURVEY

[w chest pa]
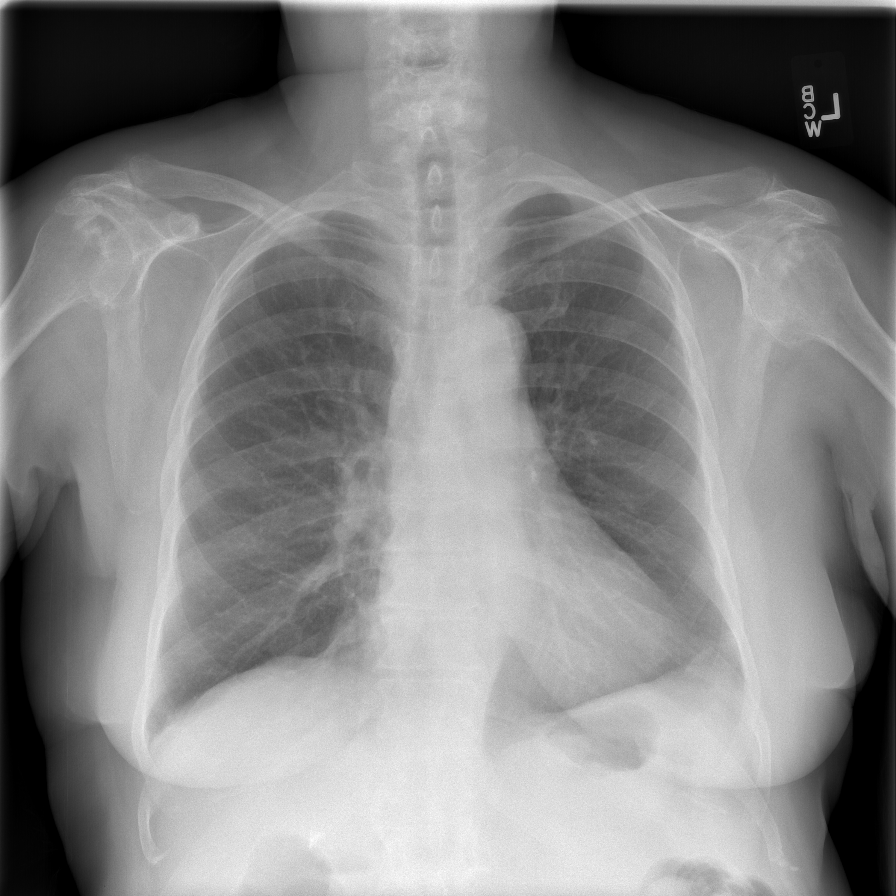

[w c-spine lat]
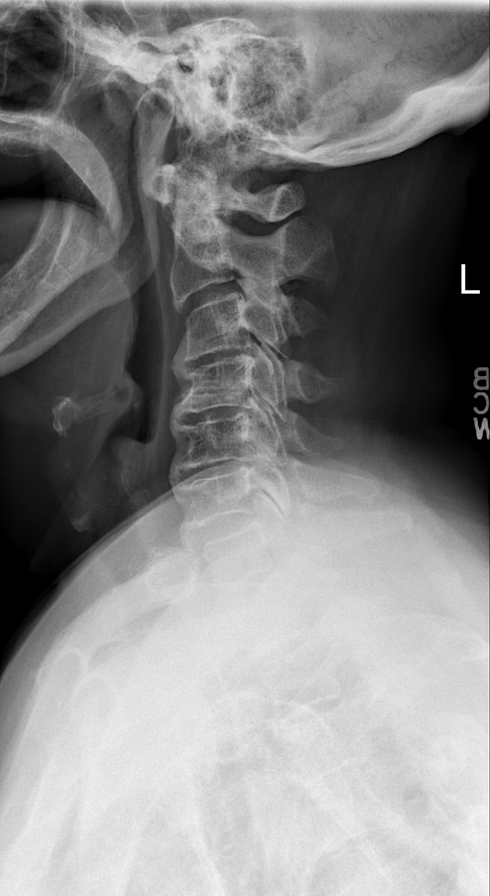

[w skull lat *]
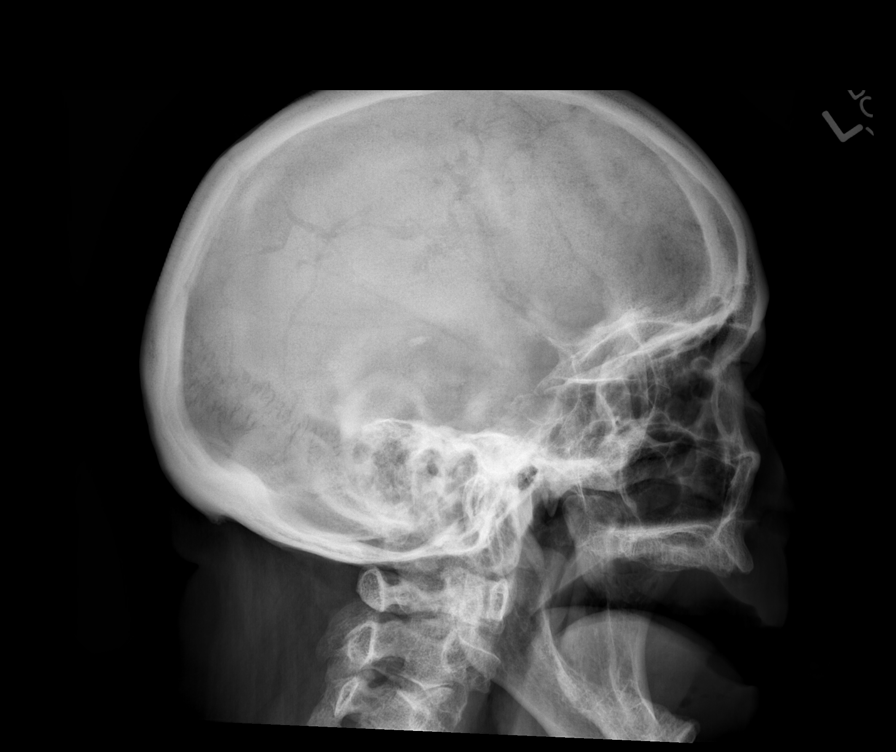

[w c-spine a.p.]
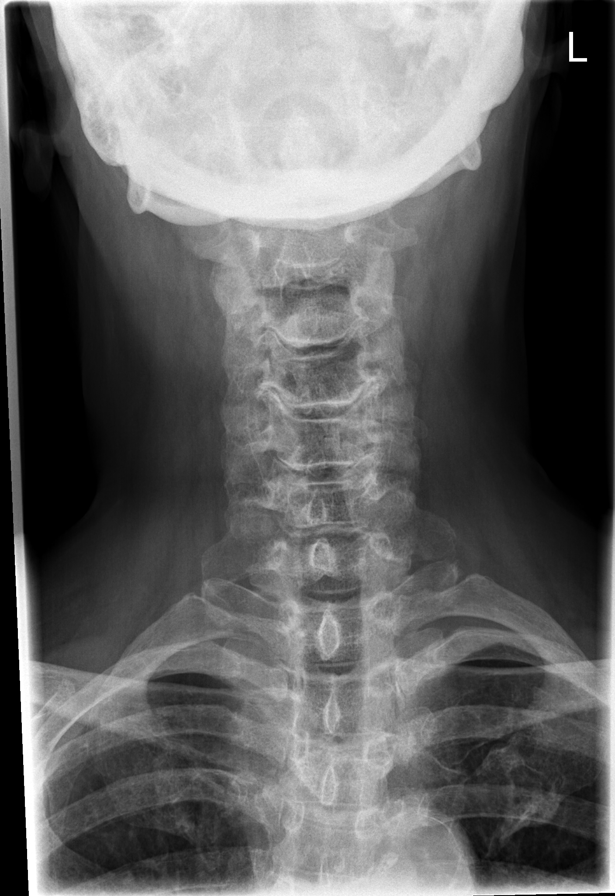

[w swimmers view]
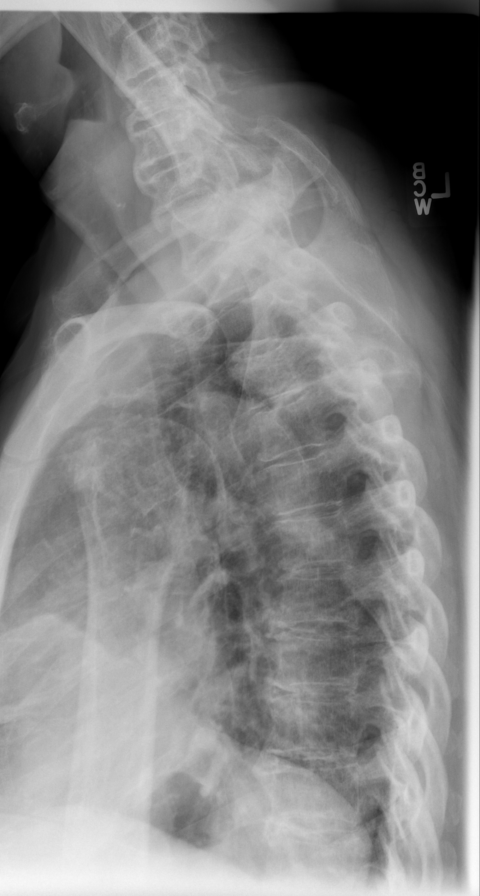

[w humerus ap left *]
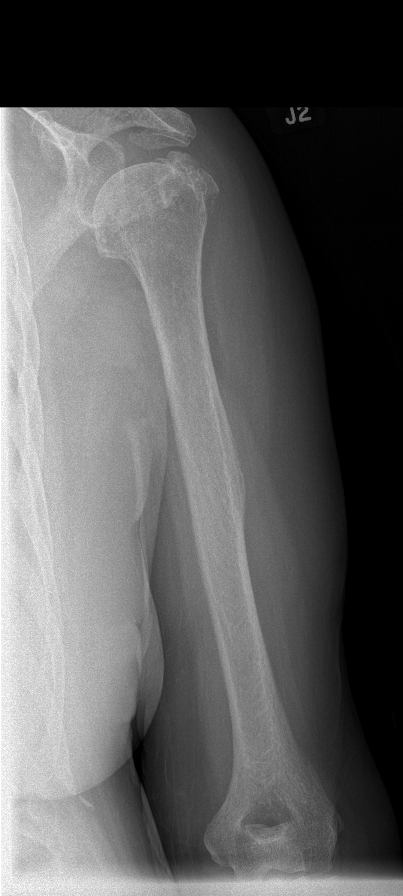

[w forearm ap left *]
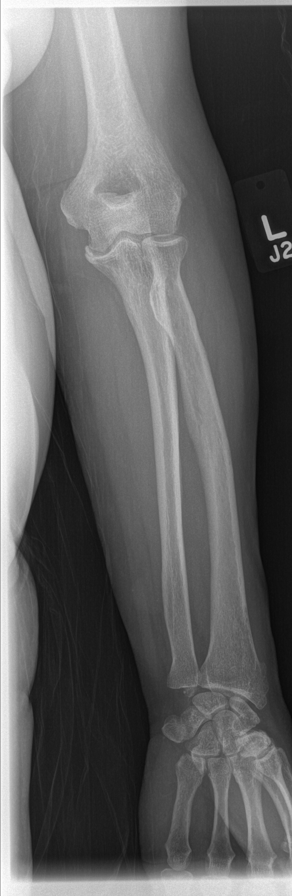

[w humerus ap right *]
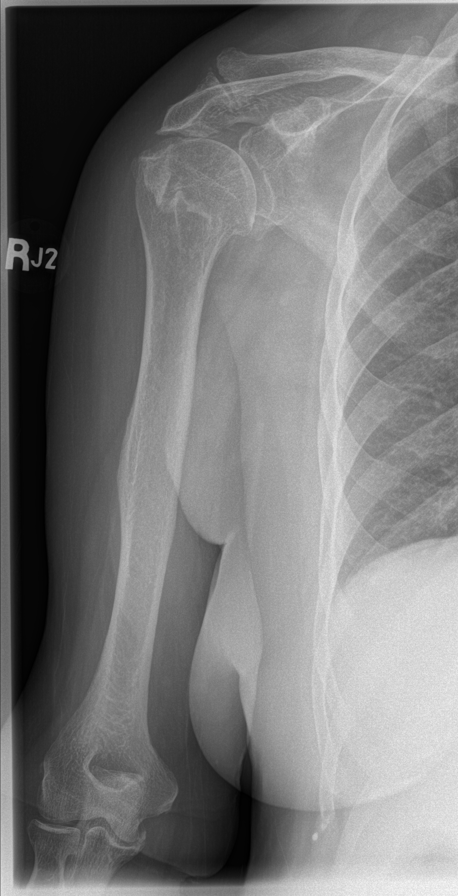

[w forearm ap right *]
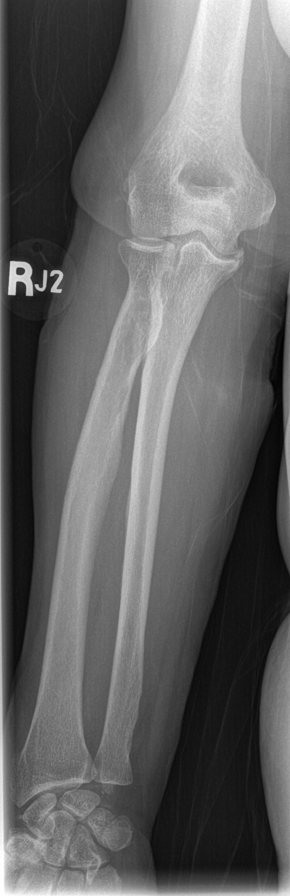

[t t-spine a.p.]
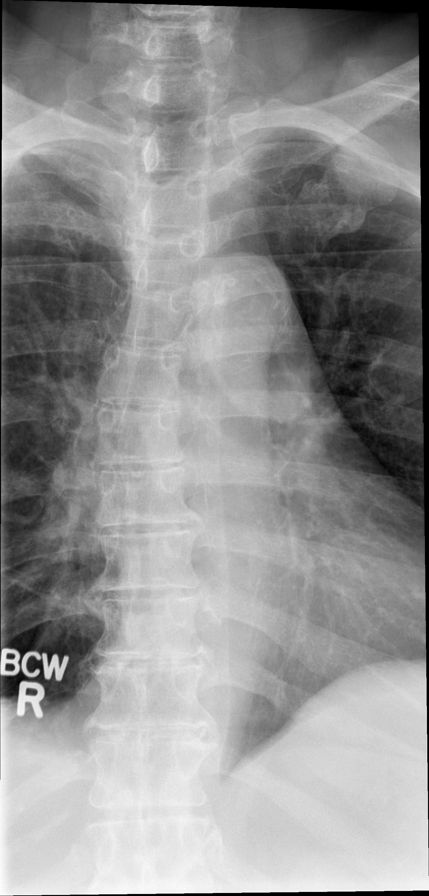

[10 of 10 positions shown; findings below may reference images not displayed]

FINDINGS: There are no discrete lytic bone lesions to suggest multiple
myeloma.

There is a vague area of mild lucency along the lateral cortex of
the proximal right femoral shaft, nonspecific.

No osteoblastic lesions.

Degenerative changes are noted throughout the spine. There are
prominent arthropathic changes of both shoulders, greater on the
left.

Bones are diffusely demineralized.
IMPRESSION: 1. No discrete lytic lesions are seen to suggest multiple myeloma.
No osteoblastic lesions.
2. Vague area of lucency noted within the cortex of the lateral
proximal right femoral shaft. The etiology of this is unclear.
Recommend followup radiographs in 2-3 months to reassess. If earlier
imaging evaluation is desired clinically, however, bone scan or MRI
would be recommended.
3. Degenerative changes noted throughout the spine and arthropathic
changes are noted of both shoulders. Diffuse bony demineralization.

## 2016-04-12 DIAGNOSIS — Z79899 Other long term (current) drug therapy: Secondary | ICD-10-CM | POA: Diagnosis not present

## 2016-04-13 DIAGNOSIS — R8299 Other abnormal findings in urine: Secondary | ICD-10-CM | POA: Diagnosis not present

## 2016-04-13 DIAGNOSIS — J45998 Other asthma: Secondary | ICD-10-CM | POA: Diagnosis not present

## 2016-04-13 DIAGNOSIS — M1990 Unspecified osteoarthritis, unspecified site: Secondary | ICD-10-CM | POA: Diagnosis not present

## 2016-04-13 DIAGNOSIS — M353 Polymyalgia rheumatica: Secondary | ICD-10-CM | POA: Diagnosis not present

## 2016-04-13 DIAGNOSIS — E875 Hyperkalemia: Secondary | ICD-10-CM | POA: Diagnosis not present

## 2016-04-13 DIAGNOSIS — I10 Essential (primary) hypertension: Secondary | ICD-10-CM | POA: Diagnosis not present

## 2016-04-13 DIAGNOSIS — Z6826 Body mass index (BMI) 26.0-26.9, adult: Secondary | ICD-10-CM | POA: Diagnosis not present

## 2016-04-13 DIAGNOSIS — M810 Age-related osteoporosis without current pathological fracture: Secondary | ICD-10-CM | POA: Diagnosis not present

## 2016-04-14 DIAGNOSIS — R8299 Other abnormal findings in urine: Secondary | ICD-10-CM | POA: Diagnosis not present

## 2016-04-14 DIAGNOSIS — M1611 Unilateral primary osteoarthritis, right hip: Secondary | ICD-10-CM | POA: Diagnosis not present

## 2016-04-14 DIAGNOSIS — M47819 Spondylosis without myelopathy or radiculopathy, site unspecified: Secondary | ICD-10-CM | POA: Diagnosis not present

## 2016-04-14 DIAGNOSIS — R410 Disorientation, unspecified: Secondary | ICD-10-CM | POA: Diagnosis not present

## 2016-04-14 DIAGNOSIS — N390 Urinary tract infection, site not specified: Secondary | ICD-10-CM | POA: Diagnosis not present

## 2016-04-14 DIAGNOSIS — E875 Hyperkalemia: Secondary | ICD-10-CM | POA: Diagnosis not present

## 2016-04-16 ENCOUNTER — Encounter (HOSPITAL_COMMUNITY): Payer: Self-pay | Admitting: Emergency Medicine

## 2016-04-16 ENCOUNTER — Emergency Department (HOSPITAL_COMMUNITY): Payer: Medicare Other

## 2016-04-16 ENCOUNTER — Emergency Department (HOSPITAL_COMMUNITY)
Admission: EM | Admit: 2016-04-16 | Discharge: 2016-04-16 | Disposition: A | Discharge status: Home / Self Care | Payer: Medicare Other | Attending: Emergency Medicine | Admitting: Emergency Medicine

## 2016-04-16 DIAGNOSIS — Z8673 Personal history of transient ischemic attack (TIA), and cerebral infarction without residual deficits: Secondary | ICD-10-CM | POA: Insufficient documentation

## 2016-04-16 DIAGNOSIS — S0990XA Unspecified injury of head, initial encounter: Secondary | ICD-10-CM | POA: Diagnosis present

## 2016-04-16 DIAGNOSIS — Z955 Presence of coronary angioplasty implant and graft: Secondary | ICD-10-CM | POA: Insufficient documentation

## 2016-04-16 DIAGNOSIS — S0083XA Contusion of other part of head, initial encounter: Secondary | ICD-10-CM | POA: Insufficient documentation

## 2016-04-16 DIAGNOSIS — I2510 Atherosclerotic heart disease of native coronary artery without angina pectoris: Secondary | ICD-10-CM | POA: Diagnosis not present

## 2016-04-16 DIAGNOSIS — Z87891 Personal history of nicotine dependence: Secondary | ICD-10-CM | POA: Diagnosis not present

## 2016-04-16 DIAGNOSIS — Y929 Unspecified place or not applicable: Secondary | ICD-10-CM | POA: Diagnosis not present

## 2016-04-16 DIAGNOSIS — E039 Hypothyroidism, unspecified: Secondary | ICD-10-CM | POA: Insufficient documentation

## 2016-04-16 DIAGNOSIS — N184 Chronic kidney disease, stage 4 (severe): Secondary | ICD-10-CM | POA: Insufficient documentation

## 2016-04-16 DIAGNOSIS — N3000 Acute cystitis without hematuria: Secondary | ICD-10-CM | POA: Diagnosis not present

## 2016-04-16 DIAGNOSIS — Y999 Unspecified external cause status: Secondary | ICD-10-CM | POA: Insufficient documentation

## 2016-04-16 DIAGNOSIS — I130 Hypertensive heart and chronic kidney disease with heart failure and stage 1 through stage 4 chronic kidney disease, or unspecified chronic kidney disease: Secondary | ICD-10-CM | POA: Diagnosis not present

## 2016-04-16 DIAGNOSIS — R259 Unspecified abnormal involuntary movements: Secondary | ICD-10-CM | POA: Diagnosis not present

## 2016-04-16 DIAGNOSIS — I5032 Chronic diastolic (congestive) heart failure: Secondary | ICD-10-CM | POA: Insufficient documentation

## 2016-04-16 DIAGNOSIS — E119 Type 2 diabetes mellitus without complications: Secondary | ICD-10-CM | POA: Diagnosis not present

## 2016-04-16 DIAGNOSIS — J449 Chronic obstructive pulmonary disease, unspecified: Secondary | ICD-10-CM | POA: Diagnosis not present

## 2016-04-16 DIAGNOSIS — R51 Headache: Secondary | ICD-10-CM | POA: Diagnosis not present

## 2016-04-16 DIAGNOSIS — W010XXA Fall on same level from slipping, tripping and stumbling without subsequent striking against object, initial encounter: Secondary | ICD-10-CM | POA: Insufficient documentation

## 2016-04-16 DIAGNOSIS — S0003XA Contusion of scalp, initial encounter: Secondary | ICD-10-CM | POA: Diagnosis not present

## 2016-04-16 DIAGNOSIS — Y939 Activity, unspecified: Secondary | ICD-10-CM | POA: Insufficient documentation

## 2016-04-16 LAB — URINALYSIS, ROUTINE W REFLEX MICROSCOPIC
Bilirubin Urine: NEGATIVE
Glucose, UA: NEGATIVE mg/dL
Hgb urine dipstick: NEGATIVE
KETONES UR: NEGATIVE mg/dL
LEUKOCYTES UA: NEGATIVE
Nitrite: NEGATIVE
PROTEIN: NEGATIVE mg/dL
Specific Gravity, Urine: 1.014 (ref 1.005–1.030)
pH: 7 (ref 5.0–8.0)

## 2016-04-16 MED ORDER — TRAMADOL HCL 50 MG PO TABS
50.0000 mg | ORAL_TABLET | Freq: Once | ORAL | Status: AC
  Administered 2016-04-16: 50 mg via ORAL
  Filled 2016-04-16: qty 1

## 2016-04-16 MED ORDER — AMOXICILLIN-POT CLAVULANATE 875-125 MG PO TABS: 1.0000 | ORAL_TABLET | Freq: Two times a day (BID) | ORAL | 0 refills | Status: DC

## 2016-04-16 MED ORDER — AMOXICILLIN-POT CLAVULANATE 875-125 MG PO TABS
1.0000 | ORAL_TABLET | Freq: Once | ORAL | Status: AC
  Administered 2016-04-16: 1 via ORAL
  Filled 2016-04-16: qty 1

## 2016-04-16 NOTE — ED Triage Notes (Signed)
Pt arrived via [facility]. States she got up to use the restroom once her family arrived to take her home. Pt tripped and fell backwards from a standing position. Hematoma noted on posterior left side of head. Pt denies dizziness or LOC. EMS reports no neuro deficits and states she is confused at baseline and is being treated with antibiotics for a UTI. Pt NAD at this time.

## 2016-04-16 NOTE — ED Provider Notes (Signed)
[facility] DEPT Provider Note   CSN: [id number] Arrival date & time: 04/16/16  1503     History   Chief Complaint Chief Complaint  Patient presents with  . Fall  . Head Injury    HPI Rebecca [name] is a 75 y.o. female.  He presents for evaluation after a fall. She states that she was trying to grab onto a chair to walk around. She stumbled with her foot against the edge of the chair and fell. She struck the back left side of her head on the ground. She did not lose consciousness.  She has a goose egg on the back left of her scalp in the occiput. No vision changes. No confusion. No loss of conscious. No neck or back pain. No extremity numbness or weakness.  HPI  Past Medical History:  Diagnosis Date  . CAD (coronary artery disease) 2006   Taxus stent of mid RCA. negative myoview 2014 and normal LV function  . COPD (chronic obstructive pulmonary disease) ([place])   . Diabetes mellitus   . Diastolic heart failure, NYHA class 1 ([place])   . Dysuria 01/06/2015  . Frozen shoulder    right  . Hyperkalemia 01/01/2016  . 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 Obesity   . Polymyalgia rheumatica ([place])   . Renal insufficiency   . Shortness of breath   . Stroke [facility])     Patient Active Problem List   Diagnosis Date Noted  . 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 Hyperkalemia 01/01/2016  . Right hip pain   . CKD stage 4, GFR 15-29 ml/min  08/11/2015  . Chest pain with moderate risk of acute coronary syndrome 08/10/2015  . Dysuria 01/06/2015  . Tendonitis, Achilles 01/06/2015  . 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 Leukocytosis 01/28/2014  . Memory loss 11/26/2013  . History of TIA - May 2015- Plavix 10/16/2013  . COPD exacerbation ([place]) 07/16/2013  . Snoring 06/29/2013  . Diabetes mellitus, type 2 ([place]) 06/29/2013  . Estrogen deficiency 02/06/2013  . Bruising 01/02/2013  . Osteoporosis screening 01/02/2013  .  Breast cancer screening 01/02/2013  . Personal history of fall 10/19/2012  . Polymyalgia rheumatica ([place]) 10/03/2012  . Weakness 09/27/2012  . Trochanteric bursitis of right hip 04/11/2012  . At high risk for falls 02/28/2012  . Lower urinary tract symptoms 01/27/2012  . Chronic constipation 01/26/2012  . Memory impairment 12/01/2010  . History of small bowel obstruction 11/04/2010  . COPD, MILD 07/09/2010  . 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 CONSTIPATION, CHRONIC 12/12/2008  . PRURITUS 03/06/2008  . Schizoaffective disorder ([place]) 01/10/2008  . GOUT 09/11/2007  . LOW BACK PAIN, CHRONIC 07/05/2007  . Dyslipidemia 01/25/2007  . Hypothyroidism 08/11/2006  . OBESITY, NOS 08/11/2006  . Anemia in chronic renal disease 08/11/2006  . Essential hypertension 08/11/2006  . Coronary atherosclerosis 08/11/2006  . HEMORRHOIDS, NOS 08/11/2006  . GASTROESOPHAGEAL REFLUX, NO ESOPHAGITIS 08/11/2006  . RENAL INSUFFICIENCY, CHRONIC 08/11/2006  . Arthropathy 08/11/2006    Past Surgical History:  Procedure Laterality Date  . ABDOMINAL HYSTERECTOMY    . CARDIAC CATHETERIZATION N/A 08/13/2015   Procedure: Left Heart Cath and Coronary Angiography;  Surgeon: Jettie Booze, MD;  Location: [facility];  Service: Cardiovascular;  Laterality: N/A;  . CHOLECYSTECTOMY    . CORONARY ANGIOPLASTY WITH STENT PLACEMENT  2006   Taxus DES to RCA, 50 % residual  . PARTIAL HYSTERECTOMY  1995    OB History  No data available       Home Medications    Prior to Admission medications   Medication Sig Start Date End Date Taking? Authorizing Provider  acetaminophen (TYLENOL) 500 MG tablet Take 1,000 mg by mouth daily. Take 2 tablets (1000 mg) by mouth daily with breakfast, CAN TAKE ADDT'L TABS AS NEEDED   Yes Historical Provider, MD  albuterol (PROAIR HFA) 108 (90 Base) MCG/ACT inhaler Inhale 2 puffs into the lungs every 6 (six) hours as needed for wheezing or shortness of breath.    Yes Historical Provider, MD  cetirizine (ZYRTEC) 10 MG tablet Take 10 mg by mouth at bedtime.    Yes Historical Provider, MD  ciprofloxacin (CIPRO) 500 MG tablet Take 500 mg by mouth 2 (two) times daily.   Yes Historical Provider, MD  clopidogrel (PLAVIX) 75 MG tablet Take 1 tablet (75 mg total) by mouth daily with breakfast. Patient taking differently: Take 75 mg by mouth at bedtime.  10/18/13  Yes Thurnell Lose, MD  clozapine (CLOZARIL) 200 MG tablet Take 200 mg by mouth at bedtime. ]   Yes Historical Provider, MD  Cranberry (SM CRANBERRY) 300 MG tablet Take 300 mg by mouth at bedtime.    Yes Historical Provider, MD  denosumab (PROLIA) 60 MG/ML SOLN injection Inject 60 mg into the skin every 6 (six) months. Administer in upper arm, thigh, or abdomen   Yes Historical Provider, MD  diltiazem (CARDIZEM LA) 180 MG 24 hr tablet Take 180 mg by mouth daily with breakfast. 07/16/15  Yes Historical Provider, MD  docusate sodium (COLACE) 100 MG capsule Take 200 mg by mouth at bedtime.  03/01/12  Yes Katherina Mires, MD  Fluticasone-Salmeterol (ADVAIR) 250-50 MCG/DOSE AEPB Inhale 1 puff into the lungs at bedtime.   Yes Historical Provider, MD  isosorbide mononitrate (IMDUR) 60 MG 24 hr tablet Take 1 tablet (60 mg total) by mouth daily. 03/14/13  Yes Isaiah Serge, NP  levothyroxine (SYNTHROID, LEVOTHROID) 75 MCG tablet Take 75 mcg by mouth daily before breakfast.   Yes Historical Provider, MD  metoprolol succinate (TOPROL-XL) 100 MG 24 hr tablet Take 100 mg by mouth daily with breakfast. Take with or immediately following a meal.   Yes Historical Provider, MD  nitroGLYCERIN (NITROSTAT) 0.4 MG SL tablet Place 1 tablet (0.4 mg total) under the tongue every 5 (five) minutes as needed. For chest pain 01/07/15  Yes Pixie Casino, MD  Omega-3 Fatty Acids (FISH OIL PO) Take 1 capsule by mouth at bedtime.   Yes Historical Provider, MD  pantoprazole (PROTONIX) 40 MG tablet Take 1 tablet (40 mg total) by mouth  daily. Patient taking differently: Take 40 mg by mouth daily before breakfast.  04/03/13  Yes Pixie Casino, MD  pravastatin (PRAVACHOL) 20 MG tablet Take 20 mg by mouth at bedtime.   Yes Historical Provider, MD  Tiotropium Bromide Monohydrate (SPIRIVA RESPIMAT) 2.5 MCG/ACT AERS Inhale 2 puffs into the lungs daily.   Yes Historical Provider, MD  traMADol (ULTRAM) 50 MG tablet Take 100 mg by mouth daily. Take 2 tablets (100 mg) by mouth daily at 2pm, may also take 1-2 tablets as needed for pain   Yes Historical Provider, MD  Vitamin D, Ergocalciferol, (DRISDOL) 50000 UNITS CAPS capsule Take 1 capsule (50,000 Units total) by mouth every 7 (seven) days. Patient taking differently: Take 50,000 Units by mouth every 7 (seven) days. Fridays 08/27/13  Yes Kinnie Feil, MD  alendronate (FOSAMAX) 70 MG tablet Take 1 tablet (  70 mg total) by mouth every 7 (seven) days. Take with a full glass of water on an empty stomach. Patient not taking: Reported on 04/16/2016 06/29/13   Kinnie Feil, MD    Family History Family History  Problem Relation Age of Onset  . Diabetes Mother   . Heart disease Mother   . Heart disease Father   . Arthritis Father   . Healthy Sister   . Heart disease Brother   . Cancer Brother     colon ca  . Osteoporosis Sister   . Heart disease Brother   . Diabetes Brother   . Heart disease Brother   . Diabetes Brother   . Diabetes Brother   . Healthy Brother   . Healthy Brother   . Healthy Brother   . Cancer Maternal Uncle     blood condition  . Cancer Daughter     breast ca  . Cancer Cousin     [employer]    Social History Social History  Substance Use Topics  . Smoking status: Former Smoker    Packs/day: 1.00    Years: 35.00    Quit date: 04/14/2004  . Smokeless tobacco: Never Used  . Alcohol use No     Allergies   Levaquin [levofloxacin]   Review of Systems Review of Systems  Constitutional: Negative for appetite change, chills, diaphoresis, fatigue and  fever.  HENT: Negative for mouth sores, sore throat and trouble swallowing.   Eyes: Negative for visual disturbance.  Respiratory: Negative for cough, chest tightness, shortness of breath and wheezing.   Cardiovascular: Negative for chest pain.  Gastrointestinal: Negative for abdominal distention, abdominal pain, diarrhea, nausea and vomiting.  Endocrine: Negative for polydipsia, polyphagia and polyuria.  Genitourinary: Negative for dysuria, frequency and hematuria.  Musculoskeletal: Negative for gait problem.  Skin: Negative for color change, pallor and rash.  Neurological: Positive for headaches. Negative for dizziness, syncope and light-headedness.  Hematological: Does not bruise/bleed easily.  Psychiatric/Behavioral: Negative for behavioral problems and confusion.     Physical Exam Updated Vital Signs BP 167/79   Pulse 71   Temp 98.1 F (36.7 C) (Oral)   Resp 18   Ht 5\' 3"  (1.6 m)   Wt 177 lb (80.3 kg)   SpO2 95%   BMI 31.35 kg/m   Physical Exam  Constitutional: She is oriented to person, place, and time. She appears well-developed and well-nourished. No distress.  HENT:  Head: Normocephalic.    Eyes: Conjunctivae are normal. Pupils are equal, round, and reactive to light. No scleral icterus.  Neck: Normal range of motion. Neck supple. No thyromegaly present.  Cardiovascular: Normal rate and regular rhythm.  Exam reveals no gallop and no friction rub.   No murmur heard. Pulmonary/Chest: Effort normal and breath sounds normal. No respiratory distress. She has no wheezes. She has no rales.  Abdominal: Soft. Bowel sounds are normal. She exhibits no distension. There is no tenderness. There is no rebound.  Musculoskeletal: Normal range of motion.  Neurological: She is alert and oriented to person, place, and time.  Skin: Skin is warm and dry. No rash noted.  Psychiatric: She has a normal mood and affect. Her behavior is normal.     ED Treatments / Results  Labs (all  labs ordered are listed, but only abnormal results are displayed) Labs Reviewed  URINALYSIS, ROUTINE W REFLEX MICROSCOPIC (NOT AT [facility])    EKG  EKG Interpretation None       Radiology Ct Head Wo Contrast  Result Date: 04/16/2016 CLINICAL DATA:  Fall with scalp hematoma. Anti coagulation. Posterior headache. EXAM: CT HEAD WITHOUT CONTRAST TECHNIQUE: Contiguous axial images were obtained from the base of the skull through the vertex without intravenous contrast. COMPARISON:  Multiple exams, including 11/12/2014 and 10/16/2013 FINDINGS: Brain: Remote small linear lacunar infarct in the right cerebellum, image 23/2, stable. Periventricular white matter and corona radiata hypodensities favor chronic ischemic microvascular white matter disease. Otherwise, the brainstem, cerebellum, cerebral peduncles, thalami, basal ganglia, basilar cisterns, and ventricular system appear within normal limits. No intracranial hemorrhage, mass lesion, or acute CVA. Vascular: Unremarkable Skull: Unremarkable Sinuses/Orbits: Mild chronic left sphenoid sinusitis. Small left mastoid effusion. No orbital abnormality seen. Other: Left parietal scalp hematoma. IMPRESSION: 1. No acute intracranial findings. 2. Left parietal scalp hematoma. 3. Small left mastoid effusion. Mild chronic left sphenoid sinusitis. 4. Periventricular white matter and corona radiata hypodensities favor chronic ischemic microvascular white matter disease. 5. Small remote lacunar infarct in the right cerebellum, unchanged. Electronically Signed   By: Van Clines M.D.   On: 04/16/2016 15:54    Procedures Procedures (including critical care time)  Medications Ordered in ED Medications  amoxicillin-clavulanate (AUGMENTIN) 875-125 MG per tablet 1 tablet (1 tablet Oral Given 04/16/16 1808)  traMADol (ULTRAM) tablet 50 mg (50 mg Oral Given 04/16/16 1816)     Initial Impression / Assessment and Plan / ED Course  I have reviewed the triage vital  signs and the nursing notes.  Pertinent labs & imaging results that were available during my care of the patient were reviewed by me and considered in my medical decision making (see chart for details).  Clinical Course    Exam of patient's scalp shows soft tissue hematoma and patient on Plavix. Otherwise normal neurological exam. No neck tenderness. Plan CT head. No other areas of pain or tenderness on exam.  Final Clinical Impressions(s) / ED Diagnoses   Final diagnoses:  Injury of head, initial encounter  Acute cystitis without hematuria   Daughter arrives. She states that her primary care physician diagnosed UTI 2 days ago. She has been on an unknown antibiotic. However, the daughter received a phone call that it was "not sensitive to that antibiotic and that she needs Augmentin. Given a dose here. Prescription for 3 day course. Primary care follow-up.   New Prescriptions New Prescriptions   No medications on file     Tanna Furry, MD 04/16/16 1820

## 2016-04-16 NOTE — ED Notes (Signed)
Patient transported to CT 

## 2016-04-28 DIAGNOSIS — N183 Chronic kidney disease, stage 3 (moderate): Secondary | ICD-10-CM | POA: Diagnosis not present

## 2016-04-28 DIAGNOSIS — M353 Polymyalgia rheumatica: Secondary | ICD-10-CM | POA: Diagnosis not present

## 2016-04-28 DIAGNOSIS — Z7952 Long term (current) use of systemic steroids: Secondary | ICD-10-CM | POA: Diagnosis not present

## 2016-04-28 DIAGNOSIS — D472 Monoclonal gammopathy: Secondary | ICD-10-CM | POA: Diagnosis not present

## 2016-04-28 DIAGNOSIS — M150 Primary generalized (osteo)arthritis: Secondary | ICD-10-CM | POA: Diagnosis not present

## 2016-04-28 DIAGNOSIS — M810 Age-related osteoporosis without current pathological fracture: Secondary | ICD-10-CM | POA: Diagnosis not present

## 2016-04-28 DIAGNOSIS — M7989 Other specified soft tissue disorders: Secondary | ICD-10-CM | POA: Diagnosis not present

## 2016-05-10 DIAGNOSIS — Z79899 Other long term (current) drug therapy: Secondary | ICD-10-CM | POA: Diagnosis not present

## 2016-05-10 DIAGNOSIS — M353 Polymyalgia rheumatica: Secondary | ICD-10-CM | POA: Diagnosis not present

## 2016-05-13 ENCOUNTER — Encounter: Payer: Self-pay | Admitting: Internal Medicine

## 2016-05-13 ENCOUNTER — Ambulatory Visit (INDEPENDENT_AMBULATORY_CARE_PROVIDER_SITE_OTHER): Payer: Medicare Other | Admitting: Internal Medicine

## 2016-05-13 VITALS — BP 124/63 | HR 70 | Ht 63.0 in | Wt 156.4 lb

## 2016-05-13 DIAGNOSIS — I10 Essential (primary) hypertension: Secondary | ICD-10-CM | POA: Diagnosis not present

## 2016-05-13 DIAGNOSIS — R0602 Shortness of breath: Secondary | ICD-10-CM

## 2016-05-13 DIAGNOSIS — I2510 Atherosclerotic heart disease of native coronary artery without angina pectoris: Secondary | ICD-10-CM | POA: Diagnosis not present

## 2016-05-13 DIAGNOSIS — E782 Mixed hyperlipidemia: Secondary | ICD-10-CM

## 2016-05-13 NOTE — Patient Instructions (Signed)
Your physician wants you to follow-up in: 6 MONTHS WITH DR HILTY You will receive a reminder letter in the mail two months in advance. If you don't receive a letter, please call our office to schedule the follow-up appointment.   If you need a refill on your cardiac medications before your next appointment, please call your pharmacy.  

## 2016-05-14 DIAGNOSIS — I2510 Atherosclerotic heart disease of native coronary artery without angina pectoris: Secondary | ICD-10-CM | POA: Insufficient documentation

## 2016-05-14 NOTE — Progress Notes (Signed)
05/14/2016   PCP: [name], MD   Chief Complaint  Patient presents with  . Follow-up  . Chest Pain    pt states a few aches     Primary Cardiologist: Dr. Debara Pickett  CC: Follow-up from Richardson Dopp, PA-C  HPI:  17 YOWF presents today with her daughter after developing chest pain, Rt sided yesterday.  Pt had walked- her usual exercise in the morning, later while watching TV she developed Rt side chest pain, the pain then moved to mid sternal area then the rt arm.  Her throat felt strange also.  She had mild nausea.  Her daughter called our office and we instructed her to take NTG sl.  The patient was too afraid.  She took a lozenger that helped her throat.  Pain resolved within 15 min.    She has hx. Of CAD with cath in 2006 and rec'd Taxus DES of the mid RCA.  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  Last lexiscan myoview 09/2012 was low risk scan.  Echo 07/2012 EF 55-60%, mild hypokinesis of inf wall,  Grade 1 diastolic dysfunction.  Other hx as below.  With her negative nuc her Imdur had been d/c'd.  She developed chest pain and it was restarted at 30 mg.  Previously she had been on 60 mg.  At her last office visit, [name] started her back on Imdur 60 mg daily.  She now reports her chest pain has resolved.  Her main complaint at this time include ankle pain and some low back pain. She denies any recurrent chest pain. She had recent laboratory work performed at her primary care doctor's office this past week which shows moderate chronic kidney disease with a GFR of 31. No liver enzyme abnormalities. Well controlled cholesterol at total cholesterol 141, triglycerides 114, [name] 51 LDL 67. Hemoglobin A1c 5.5, TSH 1.7. ApoB is 58.  Mrs. Kall follows up today from recent hospitalization where she possibly had a mild TIA. It is not clear however her aspirin was switched over to Plavix. She recently had an episode this past weekend of right-sided chest discomfort that went up to a neck.  She was then complaining of some throat pain. This caused great concern her daughter who state up with her all night but did not note any further symptoms. She did take aspirin with some relief. She's afraid to take nitroglycerin but is on long-acting isosorbide. EKG is normal the office today.  I saw Ms. Brogan back in the office today. Recently she had a pneumonia and was treated with Levaquin. Apparently she had some tendinitis from this. She seems to be recovering though very quickly and is also on stair eyes. She's not had any significant chest pain and has not needed to take short-acting nitrates. Her EKG shows normal sinus rhythm today.  10/24/2015  Mrs. Ducat returns today for follow-up. She was recently seen in the office by Richardson Dopp, PA-C, which time she felt more swollen. He did not suspect that this was congestive heart failure but ordered some labs and Lasix. She never ended up taking the Lasix as her BNP was low and she was noted to be hyponatremic with rising creatinine. He recommended so dietary changes and fluid restriction. A repeat metabolic profile was then improved. She was noted to have some hyperkalemia they adjusted her diet further which her daughter says that has made a big difference. Mrs. Horsman denies any further chest pain. She does note  some swelling, particular puffiness of her face which I suspect is related to long-term steroid use.  05/13/2016  Mrs. Larrabee returns today for follow-up. She reports feeling fairly stable. She's not had any significant worsening of her edema. She does feel fatigued from time to time. Weight is fairly stable. Overall she seems to be doing fair.  Allergies  Allergen Reactions  . Levaquin [Levofloxacin] Other (See Comments)    Ruptured tendon    Current Outpatient Prescriptions  Medication Sig Dispense Refill  . acetaminophen (TYLENOL) 500 MG tablet Take 1,000 mg by mouth daily. Take 2 tablets (1000 mg) by mouth daily with breakfast, CAN TAKE  ADDT'L TABS AS NEEDED    . albuterol (PROAIR HFA) 108 (90 Base) MCG/ACT inhaler Inhale 2 puffs into the lungs every 6 (six) hours as needed for wheezing or shortness of breath.    . cetirizine (ZYRTEC) 10 MG tablet Take 10 mg by mouth at bedtime.     . clopidogrel (PLAVIX) 75 MG tablet Take 1 tablet (75 mg total) by mouth daily with breakfast. (Patient taking differently: Take 75 mg by mouth at bedtime. ) 30 tablet 0  . clozapine (CLOZARIL) 200 MG tablet Take 200 mg by mouth at bedtime. ]    . Cranberry (SM CRANBERRY) 300 MG tablet Take 300 mg by mouth at bedtime.     Marland Kitchen denosumab (PROLIA) 60 MG/ML SOLN injection Inject 60 mg into the skin every 6 (six) months. Administer in upper arm, thigh, or abdomen    . diltiazem (CARDIZEM LA) 180 MG 24 hr tablet Take 180 mg by mouth daily with breakfast.  0  . docusate sodium (COLACE) 100 MG capsule Take 200 mg by mouth at bedtime.  10 capsule 0  . Fluticasone-Salmeterol (ADVAIR) 250-50 MCG/DOSE AEPB Inhale 1 puff into the lungs at bedtime.    . isosorbide mononitrate (IMDUR) 60 MG 24 hr tablet Take 1 tablet (60 mg total) by mouth daily. 30 tablet 6  . levothyroxine (SYNTHROID, LEVOTHROID) 75 MCG tablet Take 75 mcg by mouth daily before breakfast.    . metoprolol succinate (TOPROL-XL) 100 MG 24 hr tablet Take 100 mg by mouth daily with breakfast. Take with or immediately following a meal.    . nitroGLYCERIN (NITROSTAT) 0.4 MG SL tablet Place 1 tablet (0.4 mg total) under the tongue every 5 (five) minutes as needed. For chest pain 25 tablet 3  . Omega-3 Fatty Acids (FISH OIL PO) Take 1 capsule by mouth at bedtime.    . pantoprazole (PROTONIX) 40 MG tablet Take 1 tablet (40 mg total) by mouth daily. (Patient taking differently: Take 40 mg by mouth daily before breakfast. ) 30 tablet 11  . pravastatin (PRAVACHOL) 20 MG tablet Take 20 mg by mouth at bedtime.    . Tiotropium Bromide Monohydrate (SPIRIVA RESPIMAT) 2.5 MCG/ACT AERS Inhale 2 puffs into the lungs daily.     . traMADol (ULTRAM) 50 MG tablet Take 100 mg by mouth daily. Take 2 tablets (100 mg) by mouth daily at 2pm, may also take 1-2 tablets as needed for pain    . Vitamin D, Ergocalciferol, (DRISDOL) 50000 UNITS CAPS capsule Take 1 capsule (50,000 Units total) by mouth every 7 (seven) days. (Patient taking differently: Take 50,000 Units by mouth every 7 (seven) days. Fridays) 4 capsule 0   No current facility-administered medications for this visit.     Past Medical History:  Diagnosis Date  . CAD (coronary artery disease) 2006   Taxus stent of mid RCA.  negative myoview 2014 and normal LV function  . COPD (chronic obstructive pulmonary disease) ([place])   . Diabetes mellitus   . Diastolic heart failure, NYHA class 1 ([place])   . Dysuria 01/06/2015  . Frozen shoulder    right  . Hyperkalemia 01/01/2016  . 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 Obesity   . Polymyalgia rheumatica ([place])   . Renal insufficiency   . Shortness of breath   . Stroke [facility])     Past Surgical History:  Procedure Laterality Date  . ABDOMINAL HYSTERECTOMY    . CARDIAC CATHETERIZATION N/A 08/13/2015   Procedure: Left Heart Cath and Coronary Angiography;  Surgeon: Jettie Booze, MD;  Location: [facility];  Service: Cardiovascular;  Laterality: N/A;  . CHOLECYSTECTOMY    . CORONARY ANGIOPLASTY WITH STENT PLACEMENT  2006   Taxus DES to RCA, 50 % residual  . PARTIAL HYSTERECTOMY  1995    [id number] colds or fevers, no weight changes Skin:no rashes or ulcers HEENT:no blurred vision, no congestion CV:see HPI PUL:see HPI GI:no diarrhea constipation or melena, no indigestion GU:no hematuria, no dysuria MS:+ joint pain, no claudication Neuro:no syncope, no lightheadedness Endo:+ diabetes, + thyroid disease  PHYSICAL EXAM BP 124/63   Pulse 70   Ht 5\' 3"  (1.6 m)   Wt 156 lb 6.4 oz (70.9 kg)   BMI 27.71 kg/m  GEN: Awake, NAD, moon facies HEENT: PERRLA, EOMI LUNGS:  CLEAR ABD: S/NT, +BS EXT: No edema NEURO: Grossly intact PSYCH: Normal mood, affect  EKG: Normal sinus rhythm at 70  ASSESSMENT AND PLAN Patient Active Problem List   Diagnosis Date Noted  . 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 Hyperkalemia 01/01/2016  . Right hip pain   . CKD stage 4, GFR 15-29 ml/min  08/11/2015  . Chest pain with moderate risk of acute coronary syndrome 08/10/2015  . Dysuria 01/06/2015  . Tendonitis, Achilles 01/06/2015  . 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 Leukocytosis 01/28/2014  . Memory loss 11/26/2013  . History of TIA - May 2015- Plavix 10/16/2013  . COPD exacerbation ([place]) 07/16/2013  . Snoring 06/29/2013  . Diabetes mellitus, type 2 ([place]) 06/29/2013  . Estrogen deficiency 02/06/2013  . Bruising 01/02/2013  . Osteoporosis screening 01/02/2013  . Breast cancer screening 01/02/2013  . Personal history of fall 10/19/2012  . Polymyalgia rheumatica ([place]) 10/03/2012  . Weakness 09/27/2012  . Trochanteric bursitis of right hip 04/11/2012  . At high risk for falls 02/28/2012  . Lower urinary tract symptoms 01/27/2012  . Chronic constipation 01/26/2012  . Memory impairment 12/01/2010  . History of small bowel obstruction 11/04/2010  . COPD, MILD 07/09/2010  . 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 CONSTIPATION, CHRONIC 12/12/2008  . PRURITUS 03/06/2008  . Schizoaffective disorder ([place]) 01/10/2008  . GOUT 09/11/2007  . LOW BACK PAIN, CHRONIC 07/05/2007  . Dyslipidemia 01/25/2007  . Hypothyroidism 08/11/2006  . OBESITY, NOS 08/11/2006  . Anemia in chronic renal disease 08/11/2006  . Essential hypertension 08/11/2006  . Coronary atherosclerosis 08/11/2006  . HEMORRHOIDS, NOS 08/11/2006  . GASTROESOPHAGEAL REFLUX, NO ESOPHAGITIS 08/11/2006  . RENAL INSUFFICIENCY, CHRONIC 08/11/2006  . Arthropathy 08/11/2006   PLAN: 1.  Ms. Blatnick appears to be stable from a coronary standpoint. She had recent heart  catheterization which forcefully showed no new obstructive coronary disease. Her chest pain is resolved. Her rheumatologist is working on steroid tapers however with her PMR and arthritis this is been difficult. She does have significant  secondary effects of the steroids including facial swelling. Labs appear to be stable. Plan follow-up in 6 months.  Pixie Casino, MD, [facility] Attending Cardiologist [place]

## 2016-05-17 DIAGNOSIS — F250 Schizoaffective disorder, bipolar type: Secondary | ICD-10-CM | POA: Diagnosis not present

## 2016-06-01 DIAGNOSIS — I129 Hypertensive chronic kidney disease with stage 1 through stage 4 chronic kidney disease, or unspecified chronic kidney disease: Secondary | ICD-10-CM | POA: Diagnosis not present

## 2016-06-01 DIAGNOSIS — M353 Polymyalgia rheumatica: Secondary | ICD-10-CM | POA: Diagnosis not present

## 2016-06-01 DIAGNOSIS — E663 Overweight: Secondary | ICD-10-CM | POA: Diagnosis not present

## 2016-06-01 DIAGNOSIS — N390 Urinary tract infection, site not specified: Secondary | ICD-10-CM | POA: Diagnosis not present

## 2016-06-01 DIAGNOSIS — M810 Age-related osteoporosis without current pathological fracture: Secondary | ICD-10-CM | POA: Diagnosis not present

## 2016-06-01 DIAGNOSIS — N183 Chronic kidney disease, stage 3 (moderate): Secondary | ICD-10-CM | POA: Diagnosis not present

## 2016-06-09 DIAGNOSIS — D649 Anemia, unspecified: Secondary | ICD-10-CM | POA: Diagnosis not present

## 2016-06-09 DIAGNOSIS — N184 Chronic kidney disease, stage 4 (severe): Secondary | ICD-10-CM | POA: Diagnosis not present

## 2016-06-09 DIAGNOSIS — Z79899 Other long term (current) drug therapy: Secondary | ICD-10-CM | POA: Diagnosis not present

## 2016-06-14 DIAGNOSIS — J189 Pneumonia, unspecified organism: Secondary | ICD-10-CM

## 2016-06-14 HISTORY — DX: Pneumonia, unspecified organism: J18.9

## 2016-07-14 ENCOUNTER — Inpatient Hospital Stay (HOSPITAL_COMMUNITY): Payer: Medicare Other

## 2016-07-14 ENCOUNTER — Encounter (HOSPITAL_COMMUNITY): Payer: Self-pay | Admitting: Emergency Medicine

## 2016-07-14 ENCOUNTER — Inpatient Hospital Stay (HOSPITAL_COMMUNITY)
Admission: EM | Admit: 2016-07-14 | Discharge: 2016-07-19 | DRG: 871 | Disposition: A | Discharge status: Home Health | Payer: Medicare Other | Attending: Internal Medicine | Admitting: Internal Medicine

## 2016-07-14 ENCOUNTER — Emergency Department (HOSPITAL_COMMUNITY): Payer: Medicare Other

## 2016-07-14 DIAGNOSIS — E785 Hyperlipidemia, unspecified: Secondary | ICD-10-CM | POA: Diagnosis present

## 2016-07-14 DIAGNOSIS — I6789 Other cerebrovascular disease: Secondary | ICD-10-CM | POA: Diagnosis not present

## 2016-07-14 DIAGNOSIS — Z6826 Body mass index (BMI) 26.0-26.9, adult: Secondary | ICD-10-CM

## 2016-07-14 DIAGNOSIS — R262 Difficulty in walking, not elsewhere classified: Secondary | ICD-10-CM | POA: Diagnosis not present

## 2016-07-14 DIAGNOSIS — D72829 Elevated white blood cell count, unspecified: Secondary | ICD-10-CM

## 2016-07-14 DIAGNOSIS — E1122 Type 2 diabetes mellitus with diabetic chronic kidney disease: Secondary | ICD-10-CM | POA: Diagnosis present

## 2016-07-14 DIAGNOSIS — E039 Hypothyroidism, unspecified: Secondary | ICD-10-CM | POA: Diagnosis not present

## 2016-07-14 DIAGNOSIS — J189 Pneumonia, unspecified organism: Secondary | ICD-10-CM

## 2016-07-14 DIAGNOSIS — J440 Chronic obstructive pulmonary disease with acute lower respiratory infection: Secondary | ICD-10-CM | POA: Diagnosis present

## 2016-07-14 DIAGNOSIS — Z79899 Other long term (current) drug therapy: Secondary | ICD-10-CM

## 2016-07-14 DIAGNOSIS — F259 Schizoaffective disorder, unspecified: Secondary | ICD-10-CM | POA: Diagnosis not present

## 2016-07-14 DIAGNOSIS — I5032 Chronic diastolic (congestive) heart failure: Secondary | ICD-10-CM | POA: Diagnosis present

## 2016-07-14 DIAGNOSIS — Z955 Presence of coronary angioplasty implant and graft: Secondary | ICD-10-CM

## 2016-07-14 DIAGNOSIS — I2510 Atherosclerotic heart disease of native coronary artery without angina pectoris: Secondary | ICD-10-CM | POA: Diagnosis present

## 2016-07-14 DIAGNOSIS — A419 Sepsis, unspecified organism: Principal | ICD-10-CM

## 2016-07-14 DIAGNOSIS — N184 Chronic kidney disease, stage 4 (severe): Secondary | ICD-10-CM | POA: Diagnosis present

## 2016-07-14 DIAGNOSIS — N189 Chronic kidney disease, unspecified: Secondary | ICD-10-CM

## 2016-07-14 DIAGNOSIS — J1108 Influenza due to unidentified influenza virus with specified pneumonia: Secondary | ICD-10-CM | POA: Diagnosis present

## 2016-07-14 DIAGNOSIS — Z888 Allergy status to other drugs, medicaments and biological substances status: Secondary | ICD-10-CM

## 2016-07-14 DIAGNOSIS — Z8673 Personal history of transient ischemic attack (TIA), and cerebral infarction without residual deficits: Secondary | ICD-10-CM | POA: Diagnosis not present

## 2016-07-14 DIAGNOSIS — R413 Other amnesia: Secondary | ICD-10-CM | POA: Diagnosis present

## 2016-07-14 DIAGNOSIS — F4489 Other dissociative and conversion disorders: Secondary | ICD-10-CM | POA: Diagnosis not present

## 2016-07-14 DIAGNOSIS — M353 Polymyalgia rheumatica: Secondary | ICD-10-CM | POA: Diagnosis present

## 2016-07-14 DIAGNOSIS — J441 Chronic obstructive pulmonary disease with (acute) exacerbation: Secondary | ICD-10-CM | POA: Diagnosis not present

## 2016-07-14 DIAGNOSIS — K219 Gastro-esophageal reflux disease without esophagitis: Secondary | ICD-10-CM | POA: Diagnosis not present

## 2016-07-14 DIAGNOSIS — D631 Anemia in chronic kidney disease: Secondary | ICD-10-CM | POA: Diagnosis not present

## 2016-07-14 DIAGNOSIS — G9341 Metabolic encephalopathy: Secondary | ICD-10-CM | POA: Diagnosis present

## 2016-07-14 DIAGNOSIS — G9340 Encephalopathy, unspecified: Secondary | ICD-10-CM

## 2016-07-14 DIAGNOSIS — Z7902 Long term (current) use of antithrombotics/antiplatelets: Secondary | ICD-10-CM

## 2016-07-14 DIAGNOSIS — L89319 Pressure ulcer of right buttock, unspecified stage: Secondary | ICD-10-CM | POA: Diagnosis present

## 2016-07-14 DIAGNOSIS — L8990 Pressure ulcer of unspecified site, unspecified stage: Secondary | ICD-10-CM | POA: Insufficient documentation

## 2016-07-14 DIAGNOSIS — E669 Obesity, unspecified: Secondary | ICD-10-CM | POA: Diagnosis present

## 2016-07-14 DIAGNOSIS — Z881 Allergy status to other antibiotic agents status: Secondary | ICD-10-CM | POA: Diagnosis not present

## 2016-07-14 DIAGNOSIS — I130 Hypertensive heart and chronic kidney disease with heart failure and stage 1 through stage 4 chronic kidney disease, or unspecified chronic kidney disease: Secondary | ICD-10-CM | POA: Diagnosis present

## 2016-07-14 DIAGNOSIS — I10 Essential (primary) hypertension: Secondary | ICD-10-CM | POA: Diagnosis present

## 2016-07-14 DIAGNOSIS — Z8249 Family history of ischemic heart disease and other diseases of the circulatory system: Secondary | ICD-10-CM

## 2016-07-14 DIAGNOSIS — Z87891 Personal history of nicotine dependence: Secondary | ICD-10-CM | POA: Diagnosis not present

## 2016-07-14 DIAGNOSIS — Z7952 Long term (current) use of systemic steroids: Secondary | ICD-10-CM | POA: Diagnosis not present

## 2016-07-14 DIAGNOSIS — R4182 Altered mental status, unspecified: Secondary | ICD-10-CM | POA: Diagnosis not present

## 2016-07-14 DIAGNOSIS — R509 Fever, unspecified: Secondary | ICD-10-CM | POA: Diagnosis not present

## 2016-07-14 HISTORY — DX: Headache, unspecified: R51.9

## 2016-07-14 HISTORY — DX: Chronic kidney disease, stage 4 (severe): N18.4

## 2016-07-14 HISTORY — DX: Other specified postprocedural states: Z98.890

## 2016-07-14 HISTORY — DX: Adverse effect of unspecified anesthetic, initial encounter: T41.45XA

## 2016-07-14 HISTORY — DX: Nausea with vomiting, unspecified: R11.2

## 2016-07-14 HISTORY — DX: Pneumonia, unspecified organism: J18.9

## 2016-07-14 HISTORY — DX: Other complications of anesthesia, initial encounter: T88.59XA

## 2016-07-14 HISTORY — DX: Headache: R51

## 2016-07-14 LAB — COMPREHENSIVE METABOLIC PANEL
ALBUMIN: 3 g/dL — AB (ref 3.5–5.0)
ALBUMIN: 2.9 g/dL — AB (ref 3.5–5.0)
ALK PHOS: 34 U/L — AB (ref 38–126)
ALT: 18 U/L (ref 14–54)
ALT: 19 U/L (ref 14–54)
AST: 22 U/L (ref 15–41)
AST: 21 U/L (ref 15–41)
Alkaline Phosphatase: 37 U/L — ABNORMAL LOW (ref 38–126)
Anion gap: 13 (ref 5–15)
Anion gap: 9 (ref 5–15)
BUN: 56 mg/dL — AB (ref 6–20)
BUN: 56 mg/dL — AB (ref 6–20)
CALCIUM: 9.5 mg/dL (ref 8.9–10.3)
CHLORIDE: 104 mmol/L (ref 101–111)
CO2: 23 mmol/L (ref 22–32)
CO2: 23 mmol/L (ref 22–32)
CREATININE: 2.68 mg/dL — AB (ref 0.44–1.00)
CREATININE: 2.46 mg/dL — AB (ref 0.44–1.00)
Calcium: 9.1 mg/dL (ref 8.9–10.3)
Chloride: 102 mmol/L (ref 101–111)
GFR calc Af Amer: 19 mL/min — ABNORMAL LOW (ref >60)
GFR calc Af Amer: 21 mL/min — ABNORMAL LOW (ref >60)
GFR calc non Af Amer: 16 mL/min — ABNORMAL LOW (ref >60)
GFR, EST NON AFRICAN AMERICAN: 18 mL/min — AB (ref >60)
GLUCOSE: 107 mg/dL — AB (ref 65–99)
GLUCOSE: 144 mg/dL — AB (ref 65–99)
POTASSIUM: 4.7 mmol/L (ref 3.5–5.1)
Potassium: 4.7 mmol/L (ref 3.5–5.1)
SODIUM: 138 mmol/L (ref 135–145)
SODIUM: 136 mmol/L (ref 135–145)
Total Bilirubin: 0.6 mg/dL (ref 0.3–1.2)
Total Bilirubin: 0.5 mg/dL (ref 0.3–1.2)
Total Protein: 5.7 g/dL — ABNORMAL LOW (ref 6.5–8.1)
Total Protein: 5.6 g/dL — ABNORMAL LOW (ref 6.5–8.1)

## 2016-07-14 LAB — I-STAT CG4 LACTIC ACID, ED: Lactic Acid, Venous: 0.9 mmol/L (ref 0.5–1.9)

## 2016-07-14 LAB — URINALYSIS, ROUTINE W REFLEX MICROSCOPIC
Bilirubin Urine: NEGATIVE
Glucose, UA: NEGATIVE mg/dL
Hgb urine dipstick: NEGATIVE
Ketones, ur: NEGATIVE mg/dL
NITRITE: NEGATIVE
PROTEIN: 30 mg/dL — AB
Specific Gravity, Urine: 1.019 (ref 1.005–1.030)
Squamous Epithelial / LPF: NONE SEEN
pH: 5 (ref 5.0–8.0)

## 2016-07-14 LAB — CBC WITH DIFFERENTIAL/PLATELET
BASOS PCT: 0 %
BASOS PCT: 0 %
Basophils Absolute: 0 10*3/uL (ref 0.0–0.1)
Basophils Absolute: 0 10*3/uL (ref 0.0–0.1)
EOS PCT: 1 %
EOS PCT: 0 %
Eosinophils Absolute: 0.3 10*3/uL (ref 0.0–0.7)
Eosinophils Absolute: 0.1 10*3/uL (ref 0.0–0.7)
HCT: 32.3 % — ABNORMAL LOW (ref 36.0–46.0)
HCT: 31.2 % — ABNORMAL LOW (ref 36.0–46.0)
Hemoglobin: 10.4 g/dL — ABNORMAL LOW (ref 12.0–15.0)
Hemoglobin: 10.2 g/dL — ABNORMAL LOW (ref 12.0–15.0)
LYMPHS ABS: 3.2 10*3/uL (ref 0.7–4.0)
LYMPHS PCT: 4 %
Lymphocytes Relative: 12 %
Lymphs Abs: 1 10*3/uL (ref 0.7–4.0)
MCH: 30.2 pg (ref 26.0–34.0)
MCH: 30.7 pg (ref 26.0–34.0)
MCHC: 32.2 g/dL (ref 30.0–36.0)
MCHC: 32.7 g/dL (ref 30.0–36.0)
MCV: 93.9 fL (ref 78.0–100.0)
MCV: 94 fL (ref 78.0–100.0)
MONO ABS: 1.3 10*3/uL — AB (ref 0.1–1.0)
Monocytes Absolute: 0.5 10*3/uL (ref 0.1–1.0)
Monocytes Relative: 5 %
Monocytes Relative: 2 %
NEUTROS ABS: 21.5 10*3/uL — AB (ref 1.7–7.7)
Neutro Abs: 23 10*3/uL — ABNORMAL HIGH (ref 1.7–7.7)
Neutrophils Relative %: 82 %
Neutrophils Relative %: 94 %
PLATELETS: 186 10*3/uL (ref 150–400)
PLATELETS: 169 10*3/uL (ref 150–400)
RBC: 3.44 MIL/uL — ABNORMAL LOW (ref 3.87–5.11)
RBC: 3.32 MIL/uL — AB (ref 3.87–5.11)
RDW: 13.4 % (ref 11.5–15.5)
RDW: 13.7 % (ref 11.5–15.5)
WBC: 26.3 10*3/uL — ABNORMAL HIGH (ref 4.0–10.5)
WBC: 24.6 10*3/uL — AB (ref 4.0–10.5)

## 2016-07-14 LAB — I-STAT VENOUS BLOOD GAS, ED
ACID-BASE DEFICIT: 1 mmol/L (ref 0.0–2.0)
Bicarbonate: 24 mmol/L (ref 20.0–28.0)
O2 SAT: 81 %
TCO2: 25 mmol/L (ref 0–100)
pCO2, Ven: 40.1 mmHg — ABNORMAL LOW (ref 44.0–60.0)
pH, Ven: 7.385 (ref 7.250–7.430)
pO2, Ven: 45 mmHg (ref 32.0–45.0)

## 2016-07-14 LAB — BRAIN NATRIURETIC PEPTIDE: B NATRIURETIC PEPTIDE 5: 159.5 pg/mL — AB (ref 0.0–100.0)

## 2016-07-14 LAB — LACTIC ACID, PLASMA: Lactic Acid, Venous: 1 mmol/L (ref 0.5–1.9)

## 2016-07-14 LAB — TSH: TSH: 0.554 u[IU]/mL (ref 0.350–4.500)

## 2016-07-14 LAB — PROTIME-INR
INR: 1.06
INR: 1.11
Prothrombin Time: 13.9 seconds (ref 11.4–15.2)
Prothrombin Time: 14.4 seconds (ref 11.4–15.2)

## 2016-07-14 LAB — APTT: APTT: 33 s (ref 24–36)

## 2016-07-14 LAB — I-STAT TROPONIN, ED: TROPONIN I, POC: 0.17 ng/mL — AB (ref 0.00–0.08)

## 2016-07-14 LAB — PROCALCITONIN: Procalcitonin: 9.82 ng/mL

## 2016-07-14 MED ORDER — DEXTROSE 5 % IV SOLN: 1.0000 g | Freq: Once | INTRAVENOUS | Status: DC

## 2016-07-14 MED ORDER — DEXTROSE 5 % IV SOLN
500.0000 mg | INTRAVENOUS | Status: DC
  Administered 2016-07-15 – 2016-07-17 (×3): 500 mg via INTRAVENOUS
  Filled 2016-07-14 (×4): qty 500

## 2016-07-14 MED ORDER — ADULT MULTIVITAMIN W/MINERALS CH
1.0000 | ORAL_TABLET | Freq: Every day | ORAL | Status: DC
  Administered 2016-07-15 – 2016-07-19 (×4): 1 via ORAL
  Filled 2016-07-14 (×6): qty 1

## 2016-07-14 MED ORDER — DEXTROSE 5 % IV SOLN: 500.0000 mg | Freq: Once | INTRAVENOUS | Status: DC

## 2016-07-14 MED ORDER — CEFTRIAXONE SODIUM 1 G IJ SOLR
1.0000 g | Freq: Once | INTRAMUSCULAR | Status: AC
  Administered 2016-07-14: 1 g via INTRAVENOUS
  Filled 2016-07-14: qty 10

## 2016-07-14 MED ORDER — TRAMADOL HCL 50 MG PO TABS
50.0000 mg | ORAL_TABLET | Freq: Once | ORAL | Status: AC
  Administered 2016-07-14: 50 mg via ORAL
  Filled 2016-07-14: qty 1

## 2016-07-14 MED ORDER — DEXTROSE 5 % IV SOLN
500.0000 mg | Freq: Once | INTRAVENOUS | Status: AC
  Administered 2016-07-14: 500 mg via INTRAVENOUS
  Filled 2016-07-14: qty 500

## 2016-07-14 MED ORDER — PANTOPRAZOLE SODIUM 40 MG PO TBEC
40.0000 mg | DELAYED_RELEASE_TABLET | Freq: Every day | ORAL | Status: DC
  Administered 2016-07-15 – 2016-07-19 (×5): 40 mg via ORAL
  Filled 2016-07-14 (×5): qty 1

## 2016-07-14 MED ORDER — ACETAMINOPHEN 500 MG PO TABS
1000.0000 mg | ORAL_TABLET | Freq: Four times a day (QID) | ORAL | Status: DC | PRN
  Administered 2016-07-15 – 2016-07-19 (×11): 1000 mg via ORAL
  Filled 2016-07-14 (×11): qty 2

## 2016-07-14 MED ORDER — METHYLPREDNISOLONE SODIUM SUCC 40 MG IJ SOLR: 20.0000 mg | Freq: Every day | INTRAMUSCULAR | Status: DC

## 2016-07-14 MED ORDER — SODIUM CHLORIDE 0.9 % IV SOLN: INTRAVENOUS | Status: DC

## 2016-07-14 MED ORDER — PRAVASTATIN SODIUM 20 MG PO TABS
20.0000 mg | ORAL_TABLET | Freq: Every day | ORAL | Status: DC
  Administered 2016-07-15 – 2016-07-18 (×5): 20 mg via ORAL
  Filled 2016-07-14 (×5): qty 1

## 2016-07-14 MED ORDER — IPRATROPIUM-ALBUTEROL 0.5-2.5 (3) MG/3ML IN SOLN: 3.0000 mL | Freq: Four times a day (QID) | RESPIRATORY_TRACT | Status: DC

## 2016-07-14 MED ORDER — IPRATROPIUM-ALBUTEROL 0.5-2.5 (3) MG/3ML IN SOLN
3.0000 mL | RESPIRATORY_TRACT | Status: DC | PRN
Start: 2016-07-14 — End: 2016-07-19

## 2016-07-14 MED ORDER — ONDANSETRON HCL 4 MG PO TABS: 4.0000 mg | ORAL_TABLET | Freq: Four times a day (QID) | ORAL | Status: DC | PRN

## 2016-07-14 MED ORDER — DEXTROSE 5 % IV SOLN
1.0000 g | INTRAVENOUS | Status: DC
  Administered 2016-07-15 – 2016-07-17 (×3): 1 g via INTRAVENOUS
  Filled 2016-07-14 (×4): qty 10

## 2016-07-14 MED ORDER — IPRATROPIUM-ALBUTEROL 0.5-2.5 (3) MG/3ML IN SOLN
3.0000 mL | Freq: Once | RESPIRATORY_TRACT | Status: AC
  Administered 2016-07-14: 3 mL via RESPIRATORY_TRACT
  Filled 2016-07-14: qty 3

## 2016-07-14 MED ORDER — SODIUM CHLORIDE 0.9 % IV SOLN
INTRAVENOUS | Status: AC
  Administered 2016-07-15: via INTRAVENOUS

## 2016-07-14 MED ORDER — METHYLPREDNISOLONE SODIUM SUCC 40 MG IJ SOLR
20.0000 mg | Freq: Every day | INTRAMUSCULAR | Status: DC
  Administered 2016-07-15 – 2016-07-16 (×2): 20 mg via INTRAVENOUS
  Filled 2016-07-14 (×2): qty 1

## 2016-07-14 MED ORDER — ONDANSETRON HCL 4 MG/2ML IJ SOLN
4.0000 mg | Freq: Four times a day (QID) | INTRAMUSCULAR | Status: DC | PRN
Start: 2016-07-14 — End: 2016-07-19

## 2016-07-14 MED ORDER — ALBUTEROL SULFATE (2.5 MG/3ML) 0.083% IN NEBU: 2.5000 mg | INHALATION_SOLUTION | RESPIRATORY_TRACT | Status: DC | PRN

## 2016-07-14 MED ORDER — LEVOTHYROXINE SODIUM 75 MCG PO TABS
75.0000 ug | ORAL_TABLET | Freq: Every day | ORAL | Status: DC
  Administered 2016-07-15 – 2016-07-19 (×6): 75 ug via ORAL
  Filled 2016-07-14 (×7): qty 1

## 2016-07-14 MED ORDER — CLOZAPINE 100 MG PO TABS
300.0000 mg | ORAL_TABLET | Freq: Every day | ORAL | Status: DC
  Administered 2016-07-15 – 2016-07-18 (×5): 300 mg via ORAL
  Filled 2016-07-14 (×6): qty 3

## 2016-07-14 MED ORDER — CLOPIDOGREL BISULFATE 75 MG PO TABS
75.0000 mg | ORAL_TABLET | Freq: Every day | ORAL | Status: DC
  Administered 2016-07-15 – 2016-07-18 (×5): 75 mg via ORAL
  Filled 2016-07-14 (×5): qty 1

## 2016-07-14 MED ORDER — HYDROCORTISONE NA SUCCINATE PF 100 MG IJ SOLR
100.0000 mg | Freq: Once | INTRAMUSCULAR | Status: AC
  Administered 2016-07-14: 100 mg via INTRAVENOUS
  Filled 2016-07-14: qty 2

## 2016-07-14 MED ORDER — LORATADINE 10 MG PO TABS
10.0000 mg | ORAL_TABLET | Freq: Every day | ORAL | Status: DC
  Administered 2016-07-15 – 2016-07-19 (×6): 10 mg via ORAL
  Filled 2016-07-14 (×6): qty 1

## 2016-07-14 NOTE — Progress Notes (Signed)
Pharmacy Antibiotic Note  Rebecca [name] is a 76 y.o. female admitted on 07/14/2016 with CAP.  Pharmacy has been consulted for ceftriaxone and azithromycin dosing.  Plan: 1) Ceftriaxone 1g IV q24 2) Azithromycin 500mg  IV q24 3) Pharmacy will sign off as no dose adjustments needed  Height: 5\' 3"  (160 cm) Weight: 156 lb (70.8 kg) IBW/kg (Calculated) : 52.4  Temp (24hrs), Avg:100.1 F (37.8 C), Min:100.1 F (37.8 C), Max:100.1 F (37.8 C)   Recent Labs Lab 07/14/16 1408 07/14/16 1423  WBC 26.3*  --   CREATININE 2.68*  --   LATICACIDVEN  --  0.90    Estimated Creatinine Clearance: 16.6 mL/min (by C-G formula based on SCr of 2.68 mg/dL (H)).    Allergies  Allergen Reactions  . Levaquin [Levofloxacin] Other (See Comments)    Ruptured tendon  . Tape Other (See Comments)    PATIENT'S SKIN IS VERY, VERY THIN AND WILL TEAR AND BRUISE EASILY; PLEASE USE AN ALTERNATIVE (SOMETHING OTHER THAN TAPE)    Antimicrobials this admission: 1/31 Ceftriaxone >> 1/31 Azithromycin >>  Dose adjustments this admission: n/a  Microbiology results: 1/31 blood x2>> 1/31 urine >>  Thank you for allowing pharmacy to be a part of this patient's care.  Deboraha Sprang 07/14/2016 8:56 PM

## 2016-07-14 NOTE — ED Triage Notes (Signed)
Pt in from home via [facility] with c/o AMS and fever. Per EMS, fever was 100.5 and AMS began at 1215 today. Pt's sats were 83% on RA when FD arrived, pt was placed on NRB. Per daughter, pt began having chills last night. Pt has productive cough with cream/gray sputum. Rectal temp on arrival 100.1. Hx of Afib, CVA, a&ox4 and walks at baseline per daughter. Lethargic, a&ox3

## 2016-07-14 NOTE — H&P (Addendum)
History and Physical    Rebecca [name] YHC:[id number] DOB: 1940-07-03 DOA: 07/14/2016  Referring MD/NP/PA: Dr. Marin Roberts   PCP: [name], MD   Outpatient Specialists: Oncology, Dr. Heath Lark; Cardiology, Dr. Janene Harvey   Patient coming from: home   Chief Complaint: altered mental status, shortness of breath  HPI: Rebecca [name] is a 76 y.o. female with medical history significant for recently discovered IgG lambda MGUS, memory loss, PMR (on prednisone), HTN. Pt presented to ED with her daughter who was concerned about her mother, this am she had difficult time waking her up. She reported last night pt took showed and usually she turns the heat on but since she was out and her daughter stayed with the grandmother she forgot to do that. By the time pt daughter came, pt was shaking vigorously, she did not have a fever and her oxygen was good. The following morning she could not get her aroused and she checked O2 sat which was in 80's. She also noticed she had more labored breathing. No cough. Pt daughter also reported fevers and chills this am. No chest pain, no abdominal pain, no nausea or vomiting. Pt daughter also notes that about a day ago she had more hip pain and she was trying to sch appt with pt rheumatologist for injection but could not schedule it. Pt subsequently had more difficult time ambulating yesterday and used cane more frequently to navigate around the house.  ED Course: In ED, BP was 110/44, HR was 95, RR 25, T max was 100.52F, oxygen saturation was 97% on room air. Blood work showed WBC count 26.3, hemoglobin 10.4, Cr 2.68 (Cr 6 months ago was 2.1). CXR showed cardiomegaly, bilateral interstitial prominence and bilateral pleural effusions suggesting congestive heart failure. Bilateral pneumonitis cannot be excluded. CT head showed chronic ischemic changes, no acute intracranial findings. She was started on empiric antibiotics s=for sepsis thought to be due to  pneumonia.  Review of Systems:  Constitutional: positive for fever, chills, activity change, appetite change and fatigue.  HENT: Negative for ear pain, nosebleeds, congestion, facial swelling, rhinorrhea, neck pain, neck stiffness and ear discharge.   Eyes: Negative for pain, discharge, redness, itching and visual disturbance.  Respiratory: positive for shortness of breath, no stridor, no cough   Cardiovascular: Negative for chest pain, palpitations and leg swelling.  Gastrointestinal: Negative for abdominal distention.  Genitourinary: Negative for dysuria, urgency, frequency, hematuria, flank pain, decreased urine volume, difficulty urinating and dyspareunia.  Musculoskeletal: Negative for back pain, joint swelling, positive for hip pain and difficulty ambulating without cane  Neurological: Negative for dizziness, tremors, seizures, syncope, facial asymmetry, speech difficulty, weakness, light-headedness, numbness and headaches.  Hematological: Negative for adenopathy. Does not bruise/bleed easily.  Psychiatric/Behavioral: Negative for hallucinations, behavioral problems, confusion, dysphoric mood, decreased concentration and agitation.   Past Medical History:  Diagnosis Date  . CAD (coronary artery disease) 2006   Taxus stent of mid RCA. negative myoview 2014 and normal LV function  . COPD (chronic obstructive pulmonary disease) ([place])   . Diabetes mellitus   . Diastolic heart failure, NYHA class 1 ([place])   . Dysuria 01/06/2015  . Frozen shoulder    right  . Hyperkalemia 01/01/2016  . 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 Obesity   . Polymyalgia rheumatica ([place])   . Renal insufficiency   . Shortness of breath   . Stroke [facility])     Past Surgical History:  Procedure Laterality Date  . ABDOMINAL  HYSTERECTOMY    . CARDIAC CATHETERIZATION N/A 08/13/2015   Procedure: Left Heart Cath and Coronary Angiography;  Surgeon: Jettie Booze, MD;  Location: [facility];  Service: Cardiovascular;  Laterality: N/A;  . CHOLECYSTECTOMY    . CORONARY ANGIOPLASTY WITH STENT PLACEMENT  2006   Taxus DES to RCA, 50 % residual  . PARTIAL HYSTERECTOMY  1995    Social history:  reports that she quit smoking about 12 years ago. She has a 35.00 pack-year smoking history. She has never used smokeless tobacco. She reports that she does not drink alcohol or use drugs.  Ambulation ambulates occasionally with cane due to hip pain.  Allergies  Allergen Reactions  . Levaquin [Levofloxacin] Other (See Comments)    Ruptured tendon  . Tape Other (See Comments)    PATIENT'S SKIN IS VERY, VERY THIN AND WILL TEAR AND BRUISE EASILY; PLEASE USE AN ALTERNATIVE (SOMETHING OTHER THAN TAPE)    Family History  Problem Relation Age of Onset  . Diabetes Mother   . Heart disease Mother   . Heart disease Father   . Arthritis Father   . Healthy Sister   . Heart disease Brother   . Cancer Brother     colon ca  . Osteoporosis Sister   . Heart disease Brother   . Diabetes Brother   . Heart disease Brother   . Diabetes Brother   . Diabetes Brother   . Healthy Brother   . Healthy Brother   . Healthy Brother   . Cancer Maternal Uncle     blood condition  . Cancer Daughter     breast ca  . Cancer Cousin     NHL    Prior to Admission medications   Medication Sig Start Date End Date Taking? Authorizing Provider  acetaminophen (TYLENOL) 500 MG tablet Take 1,000 mg by mouth every 6 (six) hours as needed (pain).    Yes Historical Provider, MD  albuterol (PROAIR HFA) 108 (90 Base) MCG/ACT inhaler Inhale 2 puffs into the lungs every 6 (six) hours as needed for wheezing or shortness of breath.   Yes Historical Provider, MD  cetirizine (ZYRTEC) 10 MG tablet Take 10 mg by mouth at bedtime.    Yes Historical Provider, MD  clopidogrel (PLAVIX) 75 MG tablet Take 1 tablet (75 mg total) by mouth daily with breakfast. Patient taking differently: Take 75 mg by mouth at  bedtime.  10/18/13  Yes Thurnell Lose, MD  clozapine (CLOZARIL) 200 MG tablet Take 300 mg by mouth at bedtime. ]   Yes Historical Provider, MD  Cranberry (SM CRANBERRY) 300 MG tablet Take 300 mg by mouth at bedtime.    Yes Historical Provider, MD  denosumab (PROLIA) 60 MG/ML SOLN injection Inject 60 mg into the skin every 6 (six) months. Administer in upper arm, thigh, or abdomen   Yes Historical Provider, MD  diltiazem (CARDIZEM LA) 180 MG 24 hr tablet Take 180 mg by mouth daily with breakfast. 07/16/15  Yes Historical Provider, MD  docusate sodium (COLACE) 100 MG capsule Take 200 mg by mouth at bedtime.  03/01/12  Yes Katherina Mires, MD  Fluticasone-Salmeterol (ADVAIR) 250-50 MCG/DOSE AEPB Inhale 1 puff into the lungs at bedtime.   Yes Historical Provider, MD  furosemide (LASIX) 20 MG tablet Take 20 mg by mouth daily as needed for fluid or edema.   Yes Historical Provider, MD  isosorbide mononitrate (IMDUR) 60 MG 24 hr tablet Take 1 tablet (60 mg total) by  mouth daily. 03/14/13  Yes Isaiah Serge, NP  levothyroxine (SYNTHROID, LEVOTHROID) 75 MCG tablet Take 75 mcg by mouth daily before breakfast.   Yes Historical Provider, MD  metoprolol succinate (TOPROL-XL) 100 MG 24 hr tablet Take 100 mg by mouth daily with breakfast. Take with or immediately following a meal.   Yes Historical Provider, MD  Multiple Vitamins-Minerals (ONE-A-DAY MENS 50+ ADVANTAGE) TABS Take 1 tablet by mouth at bedtime.   Yes Historical Provider, MD  nitroGLYCERIN (NITROSTAT) 0.4 MG SL tablet Place 1 tablet (0.4 mg total) under the tongue every 5 (five) minutes as needed. For chest pain 01/07/15  Yes Pixie Casino, MD  pantoprazole (PROTONIX) 40 MG tablet Take 1 tablet (40 mg total) by mouth daily. Patient taking differently: Take 40 mg by mouth daily before breakfast.  04/03/13  Yes Pixie Casino, MD  polyethylene glycol powder (GLYCOLAX/MIRALAX) powder Take 17 g by mouth daily. [place]   Yes Historical Provider, MD    pravastatin (PRAVACHOL) 20 MG tablet Take 20 mg by mouth at bedtime.   Yes Historical Provider, MD  predniSONE (DELTASONE) 10 MG tablet Take 10 mg by mouth daily with breakfast.   Yes Historical Provider, MD  temazepam (RESTORIL) 15 MG capsule Take 15 mg by mouth at bedtime as needed for sleep.   Yes Historical Provider, MD  Tiotropium Bromide Monohydrate (SPIRIVA RESPIMAT) 2.5 MCG/ACT AERS Inhale 2 puffs into the lungs every morning.    Yes Historical Provider, MD  traMADol (ULTRAM) 50 MG tablet Take 100 mg by mouth 2 (two) times daily as needed (for breakthrough pain).    Yes Historical Provider, MD  traMADol (ULTRAM-ER) 300 MG 24 hr tablet Take 300 mg by mouth every morning.   Yes Historical Provider, MD  Vitamin D, Ergocalciferol, (DRISDOL) 50000 UNITS CAPS capsule Take 1 capsule (50,000 Units total) by mouth every 7 (seven) days. 08/27/13  Yes Kinnie Feil, MD  Omega-3 Fatty Acids (FISH OIL PO) Take 1 capsule by mouth at bedtime.    Historical Provider, MD    Physical Exam: Vitals:   07/14/16 1900 07/14/16 1915 07/14/16 1930 07/14/16 2015  BP: (!) 136/54 (!) 141/51 116/74 (!) 130/46  Pulse: 86 88 89 83  Resp: 18 16 17 14   Temp:      TempSrc:      SpO2: 97% 98% 99% 99%  Weight:      Height:        Constitutional: NAD, calm, comfortable Vitals:   07/14/16 1900 07/14/16 1915 07/14/16 1930 07/14/16 2015  BP: (!) 136/54 (!) 141/51 116/74 (!) 130/46  Pulse: 86 88 89 83  Resp: 18 16 17 14   Temp:      TempSrc:      SpO2: 97% 98% 99% 99%  Weight:      Height:       Eyes: PERRL, lids and conjunctivae normal ENMT: Mucous membranes are dry. Posterior pharynx clear of any exudate or lesions.Normal dentition.  Neck: normal, supple, no masses, no thyromegaly Respiratory: diminished breath sounds, some wheezing appreciate in mid lung lobes  Cardiovascular: Regular rate and rhythm. No extremity edema. 2+ pedal pulses. No carotid bruits.  Abdomen: no tenderness, no masses palpated. No  hepatosplenomegaly. Bowel sounds positive.  Musculoskeletal: no clubbing / cyanosis. Good ROM, no contractures. Normal muscle tone.  Skin: no rashes, lesions, ulcers. Few ecchymoses on LE Neurologic: CN 2-12 grossly intact. Sensation intact, DTR normal. Strength 5/5 in all 4.  Psychiatric: Sleeping, wakes up when  called her name but quickly falls asleep    Labs on Admission: I have personally reviewed following labs and imaging studies  CBC:  Recent Labs Lab 07/14/16 1408  WBC 26.3*  NEUTROABS 21.5*  HGB 10.4*  HCT 32.3*  MCV 93.9  PLT 831   Basic Metabolic Panel:  Recent Labs Lab 07/14/16 1408  NA 138  K 4.7  CL 102  CO2 23  GLUCOSE 107*  BUN 56*  CREATININE 2.68*  CALCIUM 9.5   GFR: Estimated Creatinine Clearance: 16.6 mL/min (by C-G formula based on SCr of 2.68 mg/dL (H)). Liver Function Tests:  Recent Labs Lab 07/14/16 1408  AST 22  ALT 18  ALKPHOS 34*  BILITOT 0.6  PROT 5.7*  ALBUMIN 3.0*   No results for input(s): LIPASE, AMYLASE in the last 168 hours. No results for input(s): AMMONIA in the last 168 hours. Coagulation Profile:  Recent Labs Lab 07/14/16 1408  INR 1.06   Cardiac Enzymes: No results for input(s): CKTOTAL, [name], [name], TROPONINI in the last 168 hours. BNP (last 3 results) No results for input(s): PROBNP in the last 8760 hours. HbA1C: No results for input(s): HGBA1C in the last 72 hours. CBG: No results for input(s): GLUCAP in the last 168 hours. Lipid Profile: No results for input(s): CHOL, [name], [name], [name], [name], LDLDIRECT in the last 72 hours. Thyroid Function Tests: No results for input(s): TSH, T4TOTAL, FREET4, T3FREE, THYROIDAB in the last 72 hours. Anemia Panel: No results for input(s): VITAMINB12, FOLATE, FERRITIN, TIBC, IRON, RETICCTPCT in the last 72 hours. Urine analysis:    Component Value Date/Time   COLORURINE YELLOW 07/14/2016 1820   APPEARANCEUR CLEAR 07/14/2016 1820   LABSPEC 1.019 07/14/2016  1820   LABSPEC 1.015 01/06/2015 1542   PHURINE 5.0 07/14/2016 1820   GLUCOSEU NEGATIVE 07/14/2016 1820   GLUCOSEU Negative 01/06/2015 1542   HGBUR NEGATIVE 07/14/2016 1820   HGBUR negative 12/03/2008 1113   BILIRUBINUR NEGATIVE 07/14/2016 1820   BILIRUBINUR Negative 01/06/2015 1542   KETONESUR NEGATIVE 07/14/2016 1820   PROTEINUR 30 (A) 07/14/2016 1820   UROBILINOGEN 0.2 01/06/2015 1542   NITRITE NEGATIVE 07/14/2016 1820   LEUKOCYTESUR TRACE (A) 07/14/2016 1820   LEUKOCYTESUR Negative 01/06/2015 1542   Sepsis Labs: @LABRCNTIP (procalcitonin:4,lacticidven:4) )No results found for this or any previous visit (from the past 240 hour(s)).   Radiological Exams on Admission: Dg Chest 2 View Result Date: 07/14/2016 1. Cardiomegaly. Bilateral from interstitial prominence and bilateral pleural effusions suggesting congestive heart failure. Bilateral pneumonitis cannot be excluded . Electronically Signed   By: Marcello Moores  Register   On: 07/14/2016 14:52   Ct Head Wo Contrast Result Date:  07/14/2016 Chronic atrophic and ischemic changes without acute abnormality. Electronically Signed   By: Inez Catalina M.D.   On: 07/14/2016 15:40    EKG: Independently reviewed. Sinus rhythm   Assessment/Plan    Principal Problem:   Sepsis due to pneumonia ([place]) / Leukocytosis - Sepsis criteria met on admission with fever, tachycardia, tachypnea, leukocytosis and source of infection thought to be pneumonia  - Sepsis and pneumonia order set utilized - Follow up resp cx, blood cx, strep pneumonia, legionella and influenza results - Continue azithromycin and rocephin - CXR on admission with possible CHF; looking at previous ECHO in 07/2015 pt had normal EF, no evidence of volume overload on PE. She take lasix for as needed edema but lasix on hold due to worsening cr and hypotension   Active Problems:   Acute metabolic encephalopathy - Due to sepsis  and superimposed on already reported memory loss - Monitor  mental status - Obtain PT eval    COPD exacerbation ([place]) - Not currently in distress - Added duoneb every 6 hours scheduled and every 4 hours as needed for shortness of breath as needed     Hypothyroidism - Resume synthroid - Check TSH    Dyslipidemia - Resume pravastatin    Anemia in chronic renal disease - Hemoglobin stable    Benign essential HTN - BP on soft side on admission so will hold all antihypertensive meds    Schizoaffective disorder ([facility]) - Continue clozaril     GASTROESOPHAGEAL REFLUX, NO ESOPHAGITIS - Continue protonix    Polymyalgia rheumatica ([facility]) - Switch to IV steroids, solumedrol 20 mg daily IV    History of TIA - May 2015 - Continue plavix    CKD stage 4, GFR 15-29 ml/min  - Cr about 6 months ago was 2.1 - Cr elevated on this admission above baseline value likely from sepsis and lasix - Lasix on hold - Started only low rate IV fluids for about 6 hours for hydration  - Check BMP in am    Coronary artery disease involving native coronary artery of native heart without angina pectoris  - No chest pian - Continue plavix     DVT prophylaxis: SCD's bilaterally  Code Status: full code Family Communication: daughter at the bedside  Disposition Plan: telemetry bed  Consults called: PT Admission status: Inpatient; pt presented with altered mental status, fevers, chills, shortness of breath. She was found to have sepsis and pneumonia and requires antibiotics, azithromycin and Rocephin.  She needs blood and resp cultures and that may take up to 96 hours for results to come back for which reason her hospital stay will be at least 4 days.    Leisa Lenz MD [facility] Pager [phone]  If 7PM-7AM, please contact night-coverage [email or web address] Password [facility]  07/14/2016, 8:48 PM

## 2016-07-14 NOTE — ED Notes (Signed)
MD at bedside. 

## 2016-07-14 NOTE — ED Notes (Signed)
Attempted report x1. 

## 2016-07-14 NOTE — ED Notes (Signed)
Pt much more alert.  Able to sit up straight in bed independently.  Requesting pain medication.  Will provide.

## 2016-07-14 NOTE — ED Notes (Signed)
Pt transported to CT ?

## 2016-07-14 NOTE — ED Provider Notes (Signed)
[facility] DEPT Provider Note   CSN: [id number] Arrival date & time: 07/14/16  1335     History   Chief Complaint Chief Complaint  Patient presents with  . Altered Mental Status  . Fever    HPI Rebecca [name] is a 76 y.o. female. Level V caveat due to AMS.   The history is provided by the patient and a relative. No language interpreter was used.   Rebecca [name] is a 76 y.o. female who presents to the Emergency Department complaining of AMS.  Hx is provided by the patient's daughter.  She has been difficult to arouse starting today.  Last night she had shaking chills but no recorded fever and her skin felt cold to the touch.  Her daughter checked her oxygen levels today and noted that her oxygen saturation was 81% on room air. She has a history of COPD but is not on oxygen at home. She is on chronic steroids at home, 10mg  daily and was unable to take her steroids today.  Decreased oral intake yesterday.  She had nausea yesterday. The patient complains of right-sided chest pain.  Yesterday the patient complained of headache to her daughter. No vomiting or diarrhea. Past Medical History:  Diagnosis Date  . CAD (coronary artery disease) 2006   Taxus stent of mid RCA. negative myoview 2014 and normal LV function  . COPD (chronic obstructive pulmonary disease) ([place])   . Diabetes mellitus   . Diastolic heart failure, NYHA class 1 ([place])   . Dysuria 01/06/2015  . Frozen shoulder    right  . Hyperkalemia 01/01/2016  . 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 Obesity   . Polymyalgia rheumatica ([place])   . Renal insufficiency   . Shortness of breath   . Stroke [facility])     Patient Active Problem List   Diagnosis Date Noted  . Benign essential HTN 07/14/2016  . Sepsis due to pneumonia ([place]) 07/14/2016  . Acute encephalopathy 07/14/2016  . Coronary artery disease involving native coronary artery of native heart without angina pectoris 05/14/2016  . CKD stage 4,  GFR 15-29 ml/min  08/11/2015  . 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 Leukocytosis 01/28/2014  . History of TIA - May 2015- Plavix 10/16/2013  . COPD exacerbation ([place]) 07/16/2013  . Polymyalgia rheumatica ([place]) 10/03/2012  . Trochanteric bursitis of right hip 04/11/2012  . History of small bowel obstruction 11/04/2010  . Schizoaffective disorder ([place]) 01/10/2008  . Dyslipidemia 01/25/2007  . Hypothyroidism 08/11/2006  . Anemia in chronic renal disease 08/11/2006  . GASTROESOPHAGEAL REFLUX, NO ESOPHAGITIS 08/11/2006    Past Surgical History:  Procedure Laterality Date  . ABDOMINAL HYSTERECTOMY    . CARDIAC CATHETERIZATION N/A 08/13/2015   Procedure: Left Heart Cath and Coronary Angiography;  Surgeon: Jettie Booze, MD;  Location: [facility];  Service: Cardiovascular;  Laterality: N/A;  . CHOLECYSTECTOMY    . CORONARY ANGIOPLASTY WITH STENT PLACEMENT  2006   Taxus DES to RCA, 50 % residual  . PARTIAL HYSTERECTOMY  1995    OB History    No data available       Home Medications    Prior to Admission medications   Medication Sig Start Date End Date Taking? Authorizing Provider  acetaminophen (TYLENOL) 500 MG tablet Take 1,000 mg by mouth every 6 (six) hours as needed (pain).    Yes Historical Provider, MD  albuterol (PROAIR HFA) 108 (90 Base) MCG/ACT inhaler Inhale 2 puffs  into the lungs every 6 (six) hours as needed for wheezing or shortness of breath.   Yes Historical Provider, MD  cetirizine (ZYRTEC) 10 MG tablet Take 10 mg by mouth at bedtime.    Yes Historical Provider, MD  clopidogrel (PLAVIX) 75 MG tablet Take 1 tablet (75 mg total) by mouth daily with breakfast. Patient taking differently: Take 75 mg by mouth at bedtime.  10/18/13  Yes Thurnell Lose, MD  clozapine (CLOZARIL) 200 MG tablet Take 300 mg by mouth at bedtime. ]   Yes Historical Provider, MD  Cranberry (SM CRANBERRY) 300 MG tablet Take 300 mg by mouth at bedtime.     Yes Historical Provider, MD  denosumab (PROLIA) 60 MG/ML SOLN injection Inject 60 mg into the skin every 6 (six) months. Administer in upper arm, thigh, or abdomen   Yes Historical Provider, MD  diltiazem (CARDIZEM LA) 180 MG 24 hr tablet Take 180 mg by mouth daily with breakfast. 07/16/15  Yes Historical Provider, MD  docusate sodium (COLACE) 100 MG capsule Take 200 mg by mouth at bedtime.  03/01/12  Yes Katherina Mires, MD  Fluticasone-Salmeterol (ADVAIR) 250-50 MCG/DOSE AEPB Inhale 1 puff into the lungs at bedtime.   Yes Historical Provider, MD  furosemide (LASIX) 20 MG tablet Take 20 mg by mouth daily as needed for fluid or edema.   Yes Historical Provider, MD  isosorbide mononitrate (IMDUR) 60 MG 24 hr tablet Take 1 tablet (60 mg total) by mouth daily. 03/14/13  Yes Isaiah Serge, NP  levothyroxine (SYNTHROID, LEVOTHROID) 75 MCG tablet Take 75 mcg by mouth daily before breakfast.   Yes Historical Provider, MD  metoprolol succinate (TOPROL-XL) 100 MG 24 hr tablet Take 100 mg by mouth daily with breakfast. Take with or immediately following a meal.   Yes Historical Provider, MD  Multiple Vitamins-Minerals (ONE-A-DAY MENS 50+ ADVANTAGE) TABS Take 1 tablet by mouth at bedtime.   Yes Historical Provider, MD  nitroGLYCERIN (NITROSTAT) 0.4 MG SL tablet Place 1 tablet (0.4 mg total) under the tongue every 5 (five) minutes as needed. For chest pain 01/07/15  Yes Pixie Casino, MD  pantoprazole (PROTONIX) 40 MG tablet Take 1 tablet (40 mg total) by mouth daily. Patient taking differently: Take 40 mg by mouth daily before breakfast.  04/03/13  Yes Pixie Casino, MD  polyethylene glycol powder (GLYCOLAX/MIRALAX) powder Take 17 g by mouth daily. [place]   Yes Historical Provider, MD  pravastatin (PRAVACHOL) 20 MG tablet Take 20 mg by mouth at bedtime.   Yes Historical Provider, MD  predniSONE (DELTASONE) 10 MG tablet Take 10 mg by mouth daily with breakfast.   Yes Historical Provider, MD  temazepam  (RESTORIL) 15 MG capsule Take 15 mg by mouth at bedtime as needed for sleep.   Yes Historical Provider, MD  Tiotropium Bromide Monohydrate (SPIRIVA RESPIMAT) 2.5 MCG/ACT AERS Inhale 2 puffs into the lungs every morning.    Yes Historical Provider, MD  traMADol (ULTRAM) 50 MG tablet Take 100 mg by mouth 2 (two) times daily as needed (for breakthrough pain).    Yes Historical Provider, MD  traMADol (ULTRAM-ER) 300 MG 24 hr tablet Take 300 mg by mouth every morning.   Yes Historical Provider, MD  Vitamin D, Ergocalciferol, (DRISDOL) 50000 UNITS CAPS capsule Take 1 capsule (50,000 Units total) by mouth every 7 (seven) days. 08/27/13  Yes Kinnie Feil, MD  Omega-3 Fatty Acids (FISH OIL PO) Take 1 capsule by mouth at bedtime.  Historical Provider, MD    Family History Family History  Problem Relation Age of Onset  . Diabetes Mother   . Heart disease Mother   . Heart disease Father   . Arthritis Father   . Healthy Sister   . Heart disease Brother   . Cancer Brother     colon ca  . Osteoporosis Sister   . Heart disease Brother   . Diabetes Brother   . Heart disease Brother   . Diabetes Brother   . Diabetes Brother   . Healthy Brother   . Healthy Brother   . Healthy Brother   . Cancer Maternal Uncle     blood condition  . Cancer Daughter     breast ca  . Cancer Cousin     NHL    Social History Social History  Substance Use Topics  . Smoking status: Former Smoker    Packs/day: 1.00    Years: 35.00    Quit date: 04/14/2004  . Smokeless tobacco: Never Used  . Alcohol use No     Allergies   Levaquin [levofloxacin] and Tape   Review of Systems Review of Systems  Unable to perform ROS: Mental status change     Physical Exam Updated Vital Signs BP (!) 145/64 (BP Location: Left Arm)   Pulse 79   Temp 98.2 F (36.8 C) (Oral)   Resp 19   Ht 5\' 3"  (1.6 m)   Wt 147 lb 6.4 oz (66.9 kg)   SpO2 99%   BMI 26.11 kg/m   Physical Exam  Constitutional: She appears  well-developed and well-nourished.  Ill appearing  HENT:  Head: Normocephalic and atraumatic.  Dry mucous membranes  Cardiovascular: Normal rate and regular rhythm.   No murmur heard. Pulmonary/Chest: Effort normal. No respiratory distress.  Rhonchi in the right lung base, decreased air movement in the left lung base.  Abdominal: Soft. There is no rebound and no guarding.  Mild diffuse abdominal tenderness  Musculoskeletal: She exhibits no edema or tenderness.  Neurological:  Drowsy but arousable to verbal stimuli. Oriented to place.  Skin: Skin is warm and dry. There is pallor.  Scattered ecchymoses diffusely in various stages of healing.   Psychiatric: She has a normal mood and affect. Her behavior is normal.  Nursing note and vitals reviewed.    ED Treatments / Results  Labs (all labs ordered are listed, but only abnormal results are displayed) Labs Reviewed  COMPREHENSIVE METABOLIC PANEL - Abnormal; Notable for the following:       Result Value   Glucose, Bld 107 (*)    BUN 56 (*)    Creatinine, Ser 2.68 (*)    Total Protein 5.7 (*)    Albumin 3.0 (*)    Alkaline Phosphatase 34 (*)    GFR calc non Af Amer 16 (*)    GFR calc Af Amer 19 (*)    All other components within normal limits  CBC WITH DIFFERENTIAL/PLATELET - Abnormal; Notable for the following:    WBC 26.3 (*)    RBC 3.44 (*)    Hemoglobin 10.4 (*)    HCT 32.3 (*)    Neutro Abs 21.5 (*)    Monocytes Absolute 1.3 (*)    All other components within normal limits  URINALYSIS, ROUTINE W REFLEX MICROSCOPIC - Abnormal; Notable for the following:    Protein, ur 30 (*)    Leukocytes, UA TRACE (*)    Bacteria, UA RARE (*)    All other components  within normal limits  BRAIN NATRIURETIC PEPTIDE - Abnormal; Notable for the following:    B Natriuretic Peptide 159.5 (*)    All other components within normal limits  CBC WITH DIFFERENTIAL/PLATELET - Abnormal; Notable for the following:    WBC 24.6 (*)    RBC 3.32 (*)     Hemoglobin 10.2 (*)    HCT 31.2 (*)    Neutro Abs 23.0 (*)    All other components within normal limits  COMPREHENSIVE METABOLIC PANEL - Abnormal; Notable for the following:    Glucose, Bld 144 (*)    BUN 56 (*)    Creatinine, Ser 2.46 (*)    Total Protein 5.6 (*)    Albumin 2.9 (*)    Alkaline Phosphatase 37 (*)    GFR calc non Af Amer 18 (*)    GFR calc Af Amer 21 (*)    All other components within normal limits  I-STAT TROPOININ, ED - Abnormal; Notable for the following:    Troponin i, poc 0.17 (*)    All other components within normal limits  I-STAT VENOUS BLOOD GAS, ED - Abnormal; Notable for the following:    pCO2, Ven 40.1 (*)    All other components within normal limits  CULTURE, BLOOD (ROUTINE X 2)  CULTURE, BLOOD (ROUTINE X 2)  URINE CULTURE  CULTURE, EXPECTORATED SPUTUM-ASSESSMENT  PROTIME-INR  LACTIC ACID, PLASMA  PROCALCITONIN  PROTIME-INR  APTT  TSH  INFLUENZA PANEL BY PCR (TYPE A & B)  STREP PNEUMONIAE URINARY ANTIGEN  LEGIONELLA PNEUMOPHILA SEROGP 1 UR AG  LACTIC ACID, PLASMA  I-STAT CG4 LACTIC ACID, ED    EKG  EKG Interpretation  Date/Time:  Wednesday July 14 2016 15:49:26 EST Ventricular Rate:  89 PR Interval:    QRS Duration: 95 QT Interval:  351 QTC Calculation: 427 R Axis:   45 Text Interpretation:  Sinus rhythm Probable left atrial enlargement Confirmed by Hazle Coca [phone]) on 07/14/2016 3:53:44 PM       Radiology Dg Chest 2 View  Result Date: 07/14/2016 CLINICAL DATA:  All noted status.  Fever. EXAM: CHEST  2 VIEW COMPARISON:  10/16/2013 . FINDINGS: Mediastinum hilar structures normal. Cardiomegaly. Diffuse bilateral from interstitial prominence with bilateral pleural effusions. Findings suggest congestive heart failure. Bilateral pneumonitis cannot be excluded. No pneumothorax . IMPRESSION: 1. Cardiomegaly. Bilateral from interstitial prominence and bilateral pleural effusions suggesting congestive heart failure. Bilateral  pneumonitis cannot be excluded . Electronically Signed   By: Marcello [place]  Register   On: 07/14/2016 14:52   Ct Head Wo Contrast  Result Date: 07/14/2016 CLINICAL DATA:  Altered mental status EXAM: CT HEAD WITHOUT CONTRAST TECHNIQUE: Contiguous axial images were obtained from the base of the skull through the vertex without intravenous contrast. COMPARISON:  04/16/2016 FINDINGS: Brain: Mild atrophic changes and scattered chronic white matter ischemic change is again identified. No findings to suggest acute hemorrhage, acute infarction or space-occupying mass lesion are noted. Vascular: No hyperdense vessel or unexpected calcification. Skull: Normal. Negative for fracture or focal lesion. Sinuses/Orbits: No acute finding. Other: None. IMPRESSION: Chronic atrophic and ischemic changes without acute abnormality. Electronically Signed   By: Inez Catalina M.D.   On: 07/14/2016 15:40    Procedures Procedures (including critical care time)  Medications Ordered in ED Medications  multivitamin with minerals tablet 1 tablet (1 tablet Oral Given 07/15/16 0017)  levothyroxine (SYNTHROID, LEVOTHROID) tablet 75 mcg (not administered)  clopidogrel (PLAVIX) tablet 75 mg (75 mg Oral Given 07/15/16 0017)  cloZAPine (CLOZARIL) tablet 300  mg (300 mg Oral Given 07/15/16 0017)  pantoprazole (PROTONIX) EC tablet 40 mg (not administered)  acetaminophen (TYLENOL) tablet 1,000 mg (not administered)  loratadine (CLARITIN) tablet 10 mg (10 mg Oral Given 07/15/16 0017)  pravastatin (PRAVACHOL) tablet 20 mg (20 mg Oral Given 07/15/16 0017)  ondansetron (ZOFRAN) tablet 4 mg (not administered)    Or  ondansetron (ZOFRAN) injection 4 mg (not administered)  albuterol (PROVENTIL) (2.5 MG/3ML) 0.083% nebulizer solution 2.5 mg (not administered)  ipratropium-albuterol (DUONEB) 0.5-2.5 (3) MG/3ML nebulizer solution 3 mL (not administered)  methylPREDNISolone sodium succinate (SOLU-MEDROL) 40 mg/mL injection 20 mg (not administered)    azithromycin (ZITHROMAX) 500 mg in dextrose 5 % 250 mL IVPB (not administered)  cefTRIAXone (ROCEPHIN) 1 g in dextrose 5 % 50 mL IVPB (not administered)  0.9 %  sodium chloride infusion ( Intravenous New Bag/Given 07/15/16 0022)  cefTRIAXone (ROCEPHIN) 1 g in dextrose 5 % 50 mL IVPB (0 g Intravenous Stopped 07/14/16 1659)  azithromycin (ZITHROMAX) 500 mg in dextrose 5 % 250 mL IVPB (0 mg Intravenous Stopped 07/14/16 1811)  hydrocortisone sodium succinate (SOLU-CORTEF) 100 MG injection 100 mg (100 mg Intravenous Given 07/14/16 1559)  traMADol (ULTRAM) tablet 50 mg (50 mg Oral Given 07/14/16 1811)  ipratropium-albuterol (DUONEB) 0.5-2.5 (3) MG/3ML nebulizer solution 3 mL (3 mLs Nebulization Given 07/14/16 1748)     Initial Impression / Assessment and Plan / ED Course  I have reviewed the triage vital signs and the nursing notes.  Pertinent labs & imaging results that were available during my care of the patient were reviewed by me and considered in my medical decision making (see chart for details).     Patient with history of COPD on chronic steroids here with change in mental status with chest pain. Concern for community acquired pneumonia based on her symptoms with leukocytosis and she was started on IV antibiotics. She is euvolemic on examination with no hypotension and no lactic acidosis, additional IV fluid hydration held in the emergency department. Troponin is elevated and this is of unclear significance given her renal disease. Patient and daughter data findings as studies or examination for admission for further treatment and they are in agreement with plan. She was given stress dose steroids given her chronic steroid use.  Final Clinical Impressions(s) / ED Diagnoses   Final diagnoses:  Sepsis [facility])    New Prescriptions Current Discharge Medication List       Quintella Reichert, MD 07/15/16 0111

## 2016-07-14 NOTE — ED Notes (Signed)
EKG given to Dr. Rees 

## 2016-07-14 NOTE — ED Notes (Signed)
Spoke to admitting, will place tele order for patient.

## 2016-07-14 NOTE — ED Notes (Signed)
Spoke to [employer] on [place], reconsidering patient placement.  Will speak to admitting and follow up.

## 2016-07-15 ENCOUNTER — Encounter (HOSPITAL_COMMUNITY): Payer: Self-pay | Admitting: General Practice

## 2016-07-15 DIAGNOSIS — L8990 Pressure ulcer of unspecified site, unspecified stage: Secondary | ICD-10-CM | POA: Insufficient documentation

## 2016-07-15 LAB — LACTIC ACID, PLASMA: LACTIC ACID, VENOUS: 1 mmol/L (ref 0.5–1.9)

## 2016-07-15 LAB — GLUCOSE, CAPILLARY: GLUCOSE-CAPILLARY: 104 mg/dL — AB (ref 65–99)

## 2016-07-15 LAB — INFLUENZA PANEL BY PCR (TYPE A & B)
INFLAPCR: NEGATIVE
Influenza B By PCR: NEGATIVE

## 2016-07-15 LAB — STREP PNEUMONIAE URINARY ANTIGEN: Strep Pneumo Urinary Antigen: NEGATIVE

## 2016-07-15 MED ORDER — TRAMADOL HCL 50 MG PO TABS
50.0000 mg | ORAL_TABLET | Freq: Two times a day (BID) | ORAL | Status: DC | PRN
  Administered 2016-07-15 – 2016-07-19 (×8): 50 mg via ORAL
  Filled 2016-07-15 (×9): qty 1

## 2016-07-15 MED ORDER — ORAL CARE MOUTH RINSE
15.0000 mL | Freq: Two times a day (BID) | OROMUCOSAL | Status: DC
  Administered 2016-07-15 – 2016-07-18 (×5): 15 mL via OROMUCOSAL

## 2016-07-15 MED ORDER — SODIUM CHLORIDE 0.9 % IV SOLN: INTRAVENOUS | Status: AC

## 2016-07-15 NOTE — Consult Note (Addendum)
[place] Nurse wound consult note Reason for Consult: Consult requested for buttocks, pt is unable to state how the wound occurred. Wound type: Right inner buttock with irregular shaped patchy areas of deep tissue injury; dark reddish purple and total affected area is 1X5cm Pressure Injury POA: Yes Dressing procedure/placement/frequency: Foam dressing to protect and promote healing. If patient is frequently incontinent, then leave foam dressing off and apply barrier cream PRN to repel moisture and protect skin. Discussed with patient that deep tissue injuries are high risk to evolve into full thickness tissue loss despite optimal plan of care. No family members present. Please re-consult if further assistance is needed.  Thank-you,  Julien Girt MSN, [place], [place], [place], [place]

## 2016-07-15 NOTE — Evaluation (Signed)
Physical Therapy Evaluation Patient Details Name: Rebecca [name] MRN: [id number] DOB: July 04, 1940 Today's Date: 07/15/2016   History of Present Illness  Rebecca [name] is a 76 y.o. female admitted on 07/14/2016 with sepsis due to CAP. Medical history significant for recently discovered IgG lambda MGUS, memory loss, PMR (on prednisone), HTN  Clinical Impression  Pt admitted with above diagnosis. Pt currently with functional limitations due to the deficits listed below (see PT Problem List). Pt will benefit from skilled PT to increase their independence and safety with mobility to allow discharge to the venue listed below.  Depending on pt progress, she may benefit from [place].  Pt has supportive family and all DME.   Follow Up Recommendations Home health PT;Supervision for mobility/OOB    Equipment Recommendations  None recommended by PT    Recommendations for Other Services       Precautions / Restrictions Precautions Precautions: Fall Restrictions Weight Bearing Restrictions: No      Mobility  Bed Mobility Overal bed mobility: Needs Assistance Bed Mobility: Supine to Sit     Supine to sit: Supervision;HOB elevated     General bed mobility comments: HOB elevated with rail  Transfers Overall transfer level: Needs assistance Equipment used: Rolling walker (2 wheeled) Transfers: Sit to/from Stand Sit to Stand: Min guard         General transfer comment: stood from bed and toilet  Ambulation/Gait Ambulation/Gait assistance: Min assist Ambulation Distance (Feet): 15 Feet (plus 25) Assistive device: Rolling walker (2 wheeled) Gait Pattern/deviations: Step-through pattern;Drifts right/left     General Gait Details: Pt needing A with maneuvering of RW.  Attempted to try gait without RW (doesn't use one at home) and pt did not feel she could ambulate without it and refused to ambulate farther because lunch arrived. o2 91% on rooom air after gait. Nursing informed.  Stairs             Wheelchair Mobility    Modified Rankin (Stroke Patients Only)       Balance Overall balance assessment: Needs assistance         Standing balance support: During functional activity Standing balance-Leahy Scale: Poor Standing balance comment: while washing hands leaning forward against sink                             Pertinent Vitals/Pain Pain Assessment: Faces Faces Pain Scale: Hurts a little bit Pain Location: R heel and R hip, headache Pain Descriptors / Indicators: Sore;Grimacing Pain Intervention(s): Limited activity within patient's tolerance;Monitored during session    Home Living Family/patient expects to be discharged to:: Private residence Living Arrangements: Children Available Help at Discharge: Family;Available 24 hours/day Type of Home: House Home Access: Stairs to enter Entrance Stairs-Rails: Right Entrance Stairs-Number of Steps: 4 Home Layout: Two level;Able to live on main level with bedroom/bathroom Home Equipment: Shower seat;Walker - 2 wheels;Walker - 4 wheels;Hospital bed;Bedside commode      Prior Function Level of Independence: Independent with assistive device(s)         Comments: used rollatot when out of the house.  no AD in house, but occasionaly used cane due to R hip pain     Hand Dominance   Dominant Hand: Right    Extremity/Trunk Assessment   Upper Extremity Assessment Upper Extremity Assessment: Overall WFL for tasks assessed    Lower Extremity Assessment Lower Extremity Assessment: Overall WFL for tasks assessed       Communication  Communication: No difficulties  Cognition Arousal/Alertness: Awake/alert Behavior During Therapy: WFL for tasks assessed/performed Overall Cognitive Status: History of cognitive impairments - at baseline                 General Comments: Pt with short term memory deficits and cues to stay on task at times.    General Comments General comments (skin  integrity, edema, etc.): Adjusted BSC height per daughter request.    Exercises     Assessment/Plan    PT Assessment Patient needs continued PT services  PT Problem List Decreased activity tolerance;Decreased balance;Decreased mobility;Decreased knowledge of use of DME;Decreased safety awareness;Cardiopulmonary status limiting activity          PT Treatment Interventions DME instruction;Gait training;Functional mobility training;Stair training;Therapeutic activities;Therapeutic exercise;Balance training;Patient/family education    PT Goals (Current goals can be found in the Care Plan section)  Acute Rehab PT Goals Patient Stated Goal: none stated PT Goal Formulation: With patient Time For Goal Achievement: 07/29/16 Potential to Achieve Goals: Good    Frequency Min 3X/week   Barriers to discharge        Co-evaluation               End of Session Equipment Utilized During Treatment: Gait belt Activity Tolerance: Patient tolerated treatment well Patient left: in chair;with call bell/phone within reach;with chair alarm set;with nursing/sitter in room Nurse Communication: Mobility status         Time: 1207 (no charge for while pt on toilet)-1249 PT Time Calculation (min) (ACUTE ONLY): 42 min   Charges:   PT Evaluation $PT Eval Moderate Complexity: 1 Procedure PT Treatments $Gait Training: 8-22 mins   PT G Codes:        Rebecca [name] 07/15/2016, 1:46 PM

## 2016-07-15 NOTE — Progress Notes (Signed)
Patient ID: Rebecca [name], female   DOB: [birth year], 76 y.o.   MRN: [id number]  PROGRESS NOTE    Rebecca [name]  [name] DOB: 1940/08/23 DOA: 07/14/2016  PCP: [name], MD   Brief Narrative:  76 y.o. female with medical history significant for recently discovered IgG lambda MGUS, memory loss, PMR (on prednisone), HTN. Pt presented to ED with her daughter who was concerned about her mother, this am she had difficult time waking her up. She reported last night pt took showed and usually she turns the heat on but since she was out and her daughter stayed with the grandmother she forgot to do that. By the time pt daughter came, pt was shaking vigorously, she did not have a fever and her oxygen was good. The following morning she could not get her aroused and she checked O2 sat which was in 80's. She also noticed she had more labored breathing.   n ED, BP was 110/44, HR was 95, RR 25, T max was 100.69F, oxygen saturation was 97% on room air. Blood work showed WBC count 26.3, hemoglobin 10.4, Cr 2.68 (Cr 6 months ago was 2.1). CXR showed cardiomegaly, bilateral interstitial prominence and bilateral pleural effusions suggesting congestive heart failure. Bilateral pneumonitis cannot be excluded. CT head showed chronic ischemic changes, no acute intracranial findings. She was started on empiric antibiotics s=for sepsis thought to be due to pneumonia.   Assessment & Plan:   Principal Problem:   Sepsis due to pneumonia ([name], tachycardia, tachypnea, [name], blood cx, [name]; looking at previous ECHO in 07/2015 pt had normal EF, [name], [name], GFR 15-29 ml/min  - Cr about 6 months ago was 2.1 - Cr elevated on this admission above baseline value likely from sepsis and lasix - Lasix remains on hold - Cr 2.46 this am    Coronary artery disease involving native coronary artery of native heart without angina pectoris - Continue plavix     DVT prophylaxis: SCD's bilaterally  Code Status: full code  Family Communication: daughter at the bedside  Disposition Plan: home likely by 2/5 once blood cx results are in  Consultants:   WOC  Procedures:   None   Antimicrobials:   Azithromycin and rocephin 07/14/2016 -->   Subjective: More alert this am.  Objective: Vitals:   07/14/16 2100 07/14/16 2140 07/15/16 0104 07/15/16 0519  BP: (!) 122/44 (!) 141/58 (!) 145/64 (!) 134/52  Pulse: 83 86 79 78  Resp: _0 Temp:  99 F (37.2 C) 98.2 F (36.8 C) 98.8 F (37.1 C)  TempSrc:  Oral Oral Oral  SpO2: 99% 95% 99% 96%  Weight:  66.9 kg (147 lb 6.4 oz)  67.9 kg (149 lb 9.6 oz)  Height:  _1  (1.6 m)      Intake/Output Summary (Last 24 hours) at 07/15/16  0952 Last data filed at 07/15/16 0388  Gross per 24 hour  Intake              600 ml  Output              800 ml  Net             -200 ml   Filed Weights   07/14/16 1351 07/14/16 2140 07/15/16 0519  Weight: 70.8 kg (156 lb) 66.9 kg (147 lb 6.4 oz) 67.9 kg (149 lb 9.6 oz)    Examination:  General exam: Appears calm and comfortable  Respiratory system: Diminished but no wheezing   Cardiovascular system: S1 & S2 heard, [name], [name], some UE Psychiatry: Judgement and insight appear normal. Mood & affect appropriate.   Data Reviewed: I have personally reviewed following labs and imaging studies  CBC:  Recent Labs Lab 07/14/16 1408 07/14/16 2204  WBC 26.3* 24.6*  NEUTROABS 21.5* 23.0*  HGB 10.4* 10.2*  HCT 32.3* 31.2*  MCV 93.9 94.0  PLT 186 828   Basic Metabolic Panel:  Recent Labs Lab 07/14/16 1408 07/14/16 2204  NA 138 136  K 4.7 4.7  CL 102 104  CO2 23 23  GLUCOSE 107* 144*  BUN 56* 56*  CREATININE 2.68* 2.46*  CALCIUM 9.5 9.1   GFR: Estimated Creatinine Clearance: 17.7 mL/min (by C-G formula based on SCr of 2.46 mg/dL (H)). Liver Function Tests:  Recent Labs Lab 07/14/16 1408 07/14/16 2204  AST 22 21  ALT 18 19  ALKPHOS 34* 37*  BILITOT 0.6 0.5  PROT 5.7* 5.6*  ALBUMIN 3.0* 2.9*   No results for input(s): LIPASE, AMYLASE in the last 168 hours. No results for input(s): AMMONIA in the last 168 hours. Coagulation Profile:  Recent Labs Lab 07/14/16 1408 07/14/16 2204  INR 1.06 1.11   Cardiac Enzymes: No results for input(s): CKTOTAL, [name], [name], TROPONINI in the last 168 hours. BNP (last 3 results) No results for input(s): PROBNP in the last 8760 hours. HbA1C: No results for  input(s): HGBA1C in the last 72 hours. CBG:  Recent Labs Lab 07/15/16 0754  GLUCAP 104*   Lipid Profile: No results for input(s): CHOL, [name], [name], [name], [name], LDLDIRECT in the last 72 hours. Thyroid Function Tests:  Recent Labs  07/14/16 2204  TSH 0.554   Anemia Panel: No results for input(s): VITAMINB12, FOLATE, FERRITIN, TIBC, IRON, RETICCTPCT in the last 72 hours. Urine analysis:    Component Value Date/Time   COLORURINE YELLOW 07/14/2016 1820   APPEARANCEUR CLEAR 07/14/2016 1820   LABSPEC  1.019 07/14/2016 1820   LABSPEC 1.015 01/06/2015 1542   PHURINE 5.0 07/14/2016 1820   GLUCOSEU NEGATIVE 07/14/2016 1820   GLUCOSEU Negative 01/06/2015 1542   HGBUR NEGATIVE 07/14/2016 1820   HGBUR negative 12/03/2008 1113   BILIRUBINUR NEGATIVE 07/14/2016 1820   BILIRUBINUR Negative 01/06/2015 1542   KETONESUR NEGATIVE 07/14/2016 1820   PROTEINUR 30 (A) 07/14/2016 1820   UROBILINOGEN 0.2 01/06/2015 1542   NITRITE NEGATIVE 07/14/2016 1820   LEUKOCYTESUR TRACE (A) 07/14/2016 1820   LEUKOCYTESUR Negative 01/06/2015 1542   Sepsis Labs: _0 (procalcitonin:4,lacticidven:4)   )No results found for this or any previous visit (from the past 240 hour(s)).    Radiology Studies: Dg Chest 2 View Result Date: 07/14/2016 1. Cardiomegaly. Bilateral from interstitial prominence and bilateral pleural effusions suggesting congestive heart failure. Bilateral pneumonitis cannot be excluded .   Ct Head Wo Contrast Result Date: 07/14/2016 Chronic atrophic and ischemic changes without acute abnormality.    Scheduled Meds: . azithromycin  500 mg Intravenous Q24H  . cefTRIAXone (ROCEPHIN)  IV  1 g Intravenous Q24H  . clopidogrel  75 mg Oral QHS  . clozapine  300 mg Oral QHS  . levothyroxine  75 mcg Oral QAC breakfast  . loratadine  10 mg Oral Daily  . methylPREDNISolone (SOLU-MEDROL) injection  20 mg Intravenous Daily  . multivitamin with minerals  1 tablet Oral QHS  .  pantoprazole  40 mg Oral QAC breakfast  . pravastatin  20 mg Oral QHS   Continuous Infusions:   LOS: 1 day    Time spent: 25 minutes  Greater than 50% of the time spent on counseling and coordinating the care.   Leisa Lenz, MD [facility] Pager [phone]  If 7PM-7AM, please contact night-coverage [email or web address] Password [email or web address] 07/15/2016, 9:52 AM

## 2016-07-15 NOTE — Progress Notes (Signed)
Pt. Arrived to the floor from the ED in stable condition. Pt. Asleep upon arrival. Daughter at bedside. Pt. Placed on telemetry, CCMD notified of pts. Arrival. cal light placed within reach. VSS. RN will continue to monitor pt. For changes in condition. Lian Tanori, Katherine Roan

## 2016-07-16 LAB — CBC
HEMATOCRIT: 31.1 % — AB (ref 36.0–46.0)
Hemoglobin: 10.4 g/dL — ABNORMAL LOW (ref 12.0–15.0)
MCH: 31 pg (ref 26.0–34.0)
MCHC: 33.4 g/dL (ref 30.0–36.0)
MCV: 92.6 fL (ref 78.0–100.0)
Platelets: 193 10*3/uL (ref 150–400)
RBC: 3.36 MIL/uL — AB (ref 3.87–5.11)
RDW: 13.4 % (ref 11.5–15.5)
WBC: 16.9 10*3/uL — AB (ref 4.0–10.5)

## 2016-07-16 LAB — BASIC METABOLIC PANEL
ANION GAP: 8 (ref 5–15)
BUN: 33 mg/dL — ABNORMAL HIGH (ref 6–20)
CO2: 25 mmol/L (ref 22–32)
Calcium: 9.6 mg/dL (ref 8.9–10.3)
Chloride: 108 mmol/L (ref 101–111)
Creatinine, Ser: 1.69 mg/dL — ABNORMAL HIGH (ref 0.44–1.00)
GFR calc Af Amer: 33 mL/min — ABNORMAL LOW (ref >60)
GFR calc non Af Amer: 28 mL/min — ABNORMAL LOW (ref >60)
GLUCOSE: 112 mg/dL — AB (ref 65–99)
POTASSIUM: 4.3 mmol/L (ref 3.5–5.1)
Sodium: 141 mmol/L (ref 135–145)

## 2016-07-16 LAB — URINE CULTURE: Culture: <10000 — AB

## 2016-07-16 LAB — GLUCOSE, CAPILLARY: Glucose-Capillary: 105 mg/dL — ABNORMAL HIGH (ref 65–99)

## 2016-07-16 LAB — LEGIONELLA PNEUMOPHILA SEROGP 1 UR AG: L. pneumophila Serogp 1 Ur Ag: NEGATIVE

## 2016-07-16 MED ORDER — METOPROLOL SUCCINATE ER 100 MG PO TB24
100.0000 mg | ORAL_TABLET | Freq: Every day | ORAL | Status: DC
  Administered 2016-07-16 – 2016-07-19 (×4): 100 mg via ORAL
  Filled 2016-07-16 (×4): qty 1

## 2016-07-16 MED ORDER — ISOSORBIDE MONONITRATE ER 60 MG PO TB24
60.0000 mg | ORAL_TABLET | Freq: Every day | ORAL | Status: DC
  Administered 2016-07-16 – 2016-07-19 (×4): 60 mg via ORAL
  Filled 2016-07-16 (×4): qty 1

## 2016-07-16 MED ORDER — PREDNISONE 10 MG PO TABS
10.0000 mg | ORAL_TABLET | Freq: Every day | ORAL | Status: DC
  Administered 2016-07-17 – 2016-07-19 (×3): 10 mg via ORAL
  Filled 2016-07-16 (×3): qty 1

## 2016-07-16 NOTE — Progress Notes (Signed)
Pt. Is alert and oriented with periods of  Forgetfulness, has been up to the [facility],  Iv antibiotics are continued, with stable  Vitals, No distress, Family at the bedside

## 2016-07-16 NOTE — Care Management Note (Signed)
Case Management Note  Patient Details  Name: Rebecca [name] MRN: [id number] Date of Birth: 1940-11-14  Subjective/Objective:    Admitted with Sepsis                Action/Plan: Patient lives with her daughter; PCP: [name], MD; has private insurance with Medicare / Tricare with prescription drug coverage; pharmacy of choice is [facility]; daughter reports no problem getting medication; DME - ordered rollater ( daughter to take it home today); Patient could benefit from a Disease Management Program for COPD; [place] choice offered, pt / daughter chose [place]; Butch Penny with [facility] called for arrangements.  Expected Discharge Date:    possibly 07/18/2016              Expected Discharge Plan:  [place]  Discharge planning Services  CM Consult  Choice offered to:  Patient, Adult Children  HH Arranged:  RN, Disease Management, PT [place] Agency:  [place]  Status of Service:  In process, will continue to follow  Sherrilyn Rist [id number] 07/16/2016, 12:46 PM

## 2016-07-16 NOTE — Progress Notes (Signed)
Patient ID: Rebecca [name], female   DOB: July 11, 1940, 76 y.o.   MRN: [id number]  PROGRESS NOTE    Rebecca [name]  [id number] DOB: 1940/09/08 DOA: 07/14/2016  PCP: [name], MD   Brief Narrative:  76 y.o. female with medical history significant for recently discovered IgG lambda MGUS, memory loss, PMR (on prednisone), HTN. Pt presented to ED with her daughter who was concerned about her mother, this am she had difficult time waking her up. She reported last night pt took showed and usually she turns the heat on but since she was out and her daughter stayed with the grandmother she forgot to do that. By the time pt daughter came, pt was shaking vigorously, she did not have a fever and her oxygen was good. The following morning she could not get her aroused and she checked O2 sat which was in 80's. She also noticed she had more labored breathing.   n ED, BP was 110/44, HR was 95, RR 25, T max was 100.71F, oxygen saturation was 97% on room air. Blood work showed WBC count 26.3, hemoglobin 10.4, Cr 2.68 (Cr 6 months ago was 2.1). CXR showed cardiomegaly, bilateral interstitial prominence and bilateral pleural effusions suggesting congestive heart failure. Bilateral pneumonitis cannot be excluded. CT head showed chronic ischemic changes, no acute intracranial findings. She was started on empiric antibiotics s=for sepsis thought to be due to pneumonia.   Assessment & Plan:   Principal Problem:   Sepsis due to pneumonia ([place]) / Leukocytosis - Sepsis criteria met on admission with fever, tachycardia, tachypnea, leukocytosis and source of infection thought to be pneumonia  - Blood and urine cx so far show no growth - Resp cx not collected, unable to collect sputum - Strep and influenza are negative - Legionella is pending  - Continue azithromycin and rocephin - Of note, CXR on admission with possible CHF; looking at previous ECHO in 07/2015 pt had normal EF, no evidence of volume overload on PE. She  take lasix for as needed edema but lasix on hold due to renal insufficiency and hypotension   Active Problems:   Acute metabolic encephalopathy - Due to sepsis and superimposed on already reported memory loss - Much more alert - Per PT, HH recommended and orders placed 2/2   Acute COPD exacerbation ([place]) - Continue duoneb every 6 hours scheduled and every 4 hours as needed for shortness of breath as needed  - Resume home dose prednisone and stop solumedrol 20 mg IV daily today     Hypothyroidism - Continue synthroid - TSH is WNL    Dyslipidemia - Continue pravastatin    Anemia in chronic renal disease - Hemoglobin remains stable     Benign essential HTN - BP on soft side on admission so BP meds placed on hold - Resume home meds as SBP in 150's this am    Schizoaffective disorder (HCC) - Continue clozaril     GASTROESOPHAGEAL REFLUX, NO ESOPHAGITIS - Continue protonix    Polymyalgia rheumatica ([place]) - Stop IV solumedrol today and resume home dose prednisone in am    History of TIA - May 2015 - Continue plavix    CKD stage 4, GFR 15-29 ml/min  - Cr about 6 months ago was 2.1 - Cr elevated on this admission above baseline value likely from sepsis and lasix - Lasix remains on hold - Cr improving  - Follow up BMP in am    Coronary artery disease involving native coronary artery of  native heart without angina pectoris - Continue plavix   Right buttock pressure injury - Right inner buttock with irregular shaped patchy areas of deep tissue injury; dark reddish purple and total affected area is 1X5cm - Dressing procedure/placement/frequency: Foam dressing to protect and promote healing. If patient frequently incontinent, then per WOC leave foam dressing off and apply barrier cream PRN to repel moisture and protect skin.  - Appreciate WOC assessment    DVT prophylaxis: SCD's bilaterally  Code Status: full code  Family Communication: daughter at the bedside    Disposition Plan: home likely by 2/5 once final blood cx results are in    Consultants:   [place] orders all placed   Procedures:   None   Antimicrobials:   Azithromycin and rocephin 07/14/2016 -->   Subjective: More alert this am.  Objective: Vitals:   07/15/16 2028 07/16/16 0645 07/16/16 0911 07/16/16 1250  BP: (!) 145/63 (!) 149/53 132/70 (!) 150/79  Pulse: 79 92  95  Resp: 18 18    Temp: 98.4 F (36.9 C) 98.9 F (37.2 C) 99.6 F (37.6 C) 97.9 F (36.6 C)  TempSrc: Oral Oral Oral Oral  SpO2: 98% 95% 95% 96%  Weight:  65.3 kg (143 lb 14.4 oz)    Height:        Intake/Output Summary (Last 24 hours) at 07/16/16 1251 Last data filed at 07/16/16 1020  Gross per 24 hour  Intake             1293 ml  Output             2495 ml  Net            -1202 ml   Filed Weights   07/14/16 2140 07/15/16 0519 07/16/16 0645  Weight: 66.9 kg (147 lb 6.4 oz) 67.9 kg (149 lb 9.6 oz) 65.3 kg (143 lb 14.4 oz)    Examination:  General exam: No acute distress  Respiratory system: Diminished but no wheezing   Cardiovascular system: S1 & S2 heard, Rate controlled  Gastrointestinal system: (+) BS, non tender abdomen  Central nervous system: No focal neurological deficits. Extremities: Symmetric 5 x 5 power. Skin: ecchymoses on LE, some UE Psychiatry: Normal mood and behavior   Data Reviewed: I have personally reviewed following labs and imaging studies  CBC:  Recent Labs Lab 07/14/16 1408 07/14/16 2204 07/16/16 0922  WBC 26.3* 24.6* 16.9*  NEUTROABS 21.5* 23.0*  --   HGB 10.4* 10.2* 10.4*  HCT 32.3* 31.2* 31.1*  MCV 93.9 94.0 92.6  PLT 186 169 817   Basic Metabolic Panel:  Recent Labs Lab 07/14/16 1408 07/14/16 2204 07/16/16 0922  NA 138 136 141  K 4.7 4.7 4.3  CL 102 104 108  CO2 23 23 25   GLUCOSE 107* 144* 112*  BUN 56* 56* 33*  CREATININE 2.68* 2.46* 1.69*  CALCIUM 9.5 9.1 9.6   GFR: Estimated Creatinine Clearance: 25.3 mL/min (by C-G formula  based on SCr of 1.69 mg/dL (H)). Liver Function Tests:  Recent Labs Lab 07/14/16 1408 07/14/16 2204  AST 22 21  ALT 18 19  ALKPHOS 34* 37*  BILITOT 0.6 0.5  PROT 5.7* 5.6*  ALBUMIN 3.0* 2.9*   No results for input(s): LIPASE, AMYLASE in the last 168 hours. No results for input(s): AMMONIA in the last 168 hours. Coagulation Profile:  Recent Labs Lab 07/14/16 1408 07/14/16 2204  INR 1.06 1.11   Cardiac Enzymes: No results for input(s): CKTOTAL, [name],  [name], TROPONINI in the last 168 hours. BNP (last 3 results) No results for input(s): PROBNP in the last 8760 hours. HbA1C: No results for input(s): HGBA1C in the last 72 hours. CBG:  Recent Labs Lab 07/15/16 0754 07/16/16 0556  GLUCAP 104* 105*   Lipid Profile: No results for input(s): CHOL, [name], [name], [name], [name], LDLDIRECT in the last 72 hours. Thyroid Function Tests:  Recent Labs  07/14/16 2204  TSH 0.554   Anemia Panel: No results for input(s): VITAMINB12, FOLATE, FERRITIN, TIBC, IRON, RETICCTPCT in the last 72 hours. Urine analysis:    Component Value Date/Time   COLORURINE YELLOW 07/14/2016 1820   APPEARANCEUR CLEAR 07/14/2016 1820   LABSPEC 1.019 07/14/2016 1820   LABSPEC 1.015 01/06/2015 1542   PHURINE 5.0 07/14/2016 1820   GLUCOSEU NEGATIVE 07/14/2016 1820   GLUCOSEU Negative 01/06/2015 1542   HGBUR NEGATIVE 07/14/2016 1820   HGBUR negative 12/03/2008 1113   BILIRUBINUR NEGATIVE 07/14/2016 1820   BILIRUBINUR Negative 01/06/2015 [place] 07/14/2016 1820   PROTEINUR 30 (A) 07/14/2016 1820   UROBILINOGEN 0.2 01/06/2015 1542   NITRITE NEGATIVE 07/14/2016 1820   LEUKOCYTESUR TRACE (A) 07/14/2016 1820   LEUKOCYTESUR Negative 01/06/2015 1542   Sepsis Labs: @LABRCNTIP (procalcitonin:4,lacticidven:4)   ) Recent Results (from the past 240 hour(s))  Culture, blood (Routine x 2)     Status: None (Preliminary result)   Collection Time: 07/14/16  2:08 PM  Result Value Ref  Range Status   Specimen Description BLOOD RIGHT ANTECUBITAL  Final   Special Requests BOTTLES DRAWN AEROBIC AND ANAEROBIC 5CC  Final   Culture NO GROWTH 1 DAY  Final   Report Status PENDING  Incomplete  Culture, blood (Routine x 2)     Status: None (Preliminary result)   Collection Time: 07/14/16  2:34 PM  Result Value Ref Range Status   Specimen Description BLOOD RIGHT FOREARM  Final   Special Requests BOTTLES DRAWN AEROBIC ONLY 4CC  Final   Culture NO GROWTH 1 DAY  Final   Report Status PENDING  Incomplete  Urine culture     Status: Abnormal   Collection Time: 07/14/16  6:20 PM  Result Value Ref Range Status   Specimen Description URINE, RANDOM  Final   Special Requests NONE  Final   Culture <10,000 COLONIES/mL INSIGNIFICANT GROWTH (A)  Final   Report Status 07/16/2016 FINAL  Final      Radiology Studies: Dg Chest 2 View Result Date: 07/14/2016 1. Cardiomegaly. Bilateral from interstitial prominence and bilateral pleural effusions suggesting congestive heart failure. Bilateral pneumonitis cannot be excluded .   Ct Head Wo Contrast Result Date: 07/14/2016 Chronic atrophic and ischemic changes without acute abnormality.    Scheduled Meds: . azithromycin  500 mg Intravenous Q24H  . cefTRIAXone (ROCEPHIN)  IV  1 g Intravenous Q24H  . clopidogrel  75 mg Oral QHS  . clozapine  300 mg Oral QHS  . levothyroxine  75 mcg Oral QAC breakfast  . loratadine  10 mg Oral Daily  . mouth rinse  15 mL Mouth Rinse BID  . methylPREDNISolone (SOLU-MEDROL) injection  20 mg Intravenous Daily  . multivitamin with minerals  1 tablet Oral QHS  . pantoprazole  40 mg Oral QAC breakfast  . pravastatin  20 mg Oral QHS   Continuous Infusions:   LOS: 2 days    Time spent: 25 minutes  Greater than 50% of the time spent on counseling and coordinating the care.   Leisa Lenz, MD [facility] Pager  [phone]  If 7PM-7AM, please contact night-coverage [email or web address] Password  [facility] 07/16/2016, 12:51 PM

## 2016-07-17 LAB — BASIC METABOLIC PANEL
Anion gap: 7 (ref 5–15)
BUN: 32 mg/dL — AB (ref 6–20)
CHLORIDE: 106 mmol/L (ref 101–111)
CO2: 26 mmol/L (ref 22–32)
Calcium: 9.2 mg/dL (ref 8.9–10.3)
Creatinine, Ser: 1.54 mg/dL — ABNORMAL HIGH (ref 0.44–1.00)
GFR calc Af Amer: 36 mL/min — ABNORMAL LOW (ref >60)
GFR calc non Af Amer: 31 mL/min — ABNORMAL LOW (ref >60)
GLUCOSE: 167 mg/dL — AB (ref 65–99)
POTASSIUM: 5 mmol/L (ref 3.5–5.1)
Sodium: 139 mmol/L (ref 135–145)

## 2016-07-17 LAB — URINALYSIS, ROUTINE W REFLEX MICROSCOPIC
BILIRUBIN URINE: NEGATIVE
Glucose, UA: NEGATIVE mg/dL
HGB URINE DIPSTICK: NEGATIVE
Ketones, ur: NEGATIVE mg/dL
NITRITE: NEGATIVE
PROTEIN: NEGATIVE mg/dL
Specific Gravity, Urine: 1.005 (ref 1.005–1.030)
pH: 8 (ref 5.0–8.0)

## 2016-07-17 LAB — GLUCOSE, CAPILLARY: Glucose-Capillary: 136 mg/dL — ABNORMAL HIGH (ref 65–99)

## 2016-07-17 LAB — CBC
HEMATOCRIT: 29 % — AB (ref 36.0–46.0)
HEMOGLOBIN: 10 g/dL — AB (ref 12.0–15.0)
MCH: 31.3 pg (ref 26.0–34.0)
MCHC: 34.5 g/dL (ref 30.0–36.0)
MCV: 90.6 fL (ref 78.0–100.0)
Platelets: 207 10*3/uL (ref 150–400)
RBC: 3.2 MIL/uL — AB (ref 3.87–5.11)
RDW: 12.8 % (ref 11.5–15.5)
WBC: 17.9 10*3/uL — ABNORMAL HIGH (ref 4.0–10.5)

## 2016-07-17 NOTE — Progress Notes (Addendum)
Patient ID: Rebecca [name], female   DOB: Oct 01, 1940, 76 y.o.   MRN: [id number]  PROGRESS NOTE    MACY POLIO  AGT:[id number] DOB: 11/20/1940 DOA: 07/14/2016  PCP: [name], MD   Brief Narrative:  76 y.o. female with medical history significant for recently discovered IgG lambda MGUS, memory loss, PMR (on prednisone), HTN. Pt presented to ED with her daughter who was concerned about her mother, this am she had difficult time waking her up. She reported last night pt took showed and usually she turns the heat on but since she was out and her daughter stayed with the grandmother she forgot to do that. By the time pt daughter came, pt was shaking vigorously, she did not have a fever and her oxygen was good. The following morning she could not get her aroused and she checked O2 sat which was in 80's. She also noticed she had more labored breathing.   n ED, BP was 110/44, HR was 95, RR 25, T max was 100.44F, oxygen saturation was 97% on room air. Blood work showed WBC count 26.3, hemoglobin 10.4, Cr 2.68 (Cr 6 months ago was 2.1). CXR showed cardiomegaly, bilateral interstitial prominence and bilateral pleural effusions suggesting congestive heart failure. Bilateral pneumonitis cannot be excluded. CT head showed chronic ischemic changes, no acute intracranial findings. She was started on empiric antibiotics for sepsis thought to be due to pneumonia.   Assessment & Plan:   Principal Problem:   Sepsis due to pneumonia ([place]) / Leukocytosis - Sepsis criteria met on admission with fever, tachycardia, tachypnea, leukocytosis and source of infection likely pneumonia  - Blood and urine cx showed no growth  - Resp cx not collected, unable to collect sputum - Strep and influenza are negative - Legionella is negative   - Continue azithromycin and rocephin - Please note that CXR on admission with possible CHF; looking at previous ECHO in 07/2015 pt had normal EF, no evidence of volume overload on PE. She  take lasix for as needed edema but lasix on hold due to renal insufficiency and hypotension   Active Problems:   Acute metabolic encephalopathy - Due to sepsis and history of memory loss - Per PT, HH recommended and orders placed 2/2 - Improved since admission    Acute COPD exacerbation ([place]) - Continue duoneb every 4 hours as needed for shortness of breath as needed  - Stable resp status     Hypothyroidism - Continue synthroid - TSH is WNL    Dyslipidemia - Continue pravastatin    Anemia in chronic renal disease - Hemoglobin stable    Benign essential HTN - BP 157/89 - Continue Imdur and metoprolol     Schizoaffective disorder (HCC) - Continue clozaril     GASTROESOPHAGEAL REFLUX, NO ESOPHAGITIS - Continue protonix    Polymyalgia rheumatica (HCC) - Continue home dose prednisone     History of TIA - May 2015 - Continue plavix    CKD stage 4, GFR 15-29 ml/min  - Cr about 6 months ago was 2.1 - Cr elevated on this admission above baseline value likely from sepsis and lasix - Lasix remains on hold - Cr 1.5 this am    Coronary artery disease involving native coronary artery of native heart without angina pectoris - Continue plavix   Right buttock pressure injury - Right inner buttock with irregular shaped patchy areas of deep tissue injury; dark reddish purple and total affected area is 1X5cm - Dressing procedure/placement/frequency: Foam dressing to protect  and promote healing. If patient frequently incontinent, then per WOC leave foam dressing off and apply barrier cream PRN to repel moisture and protect skin.  - Appreciate WOC assessment    DVT prophylaxis: SCD's bilaterally  Code Status: full code  Family Communication: daughter at the bedside 2/2 Disposition Plan: home likely by 2/5 once we know that WBC count improving    Consultants:   [place] orders all placed   Procedures:   None   Antimicrobials:   Azithromycin and  rocephin 07/14/2016 -->   Subjective: No overnight events.  Objective: Vitals:   07/16/16 1250 07/16/16 1944 07/17/16 0549 07/17/16 1228  BP: (!) 150/79 134/69 (!) 169/65 (!) 157/89  Pulse: 95 80 72 80  Resp:  _0 Temp: 97.9 F (36.6 C) 98.4 F (36.9 C) 98.2 F (36.8 C) 98 F (36.7 C)  TempSrc: Oral Oral Oral Oral  SpO2: 96% 97% 96% 97%  Weight:   65.9 kg (145 lb 4.8 oz)   Height:        Intake/Output Summary (Last 24 hours) at 07/17/16 1322 Last data filed at 07/17/16 0920  Gross per 24 hour  Intake             1260 ml  Output             1102 ml  Net              158 ml   Filed Weights   07/15/16 0519 07/16/16 0645 07/17/16 0549  Weight: 67.9 kg (149 lb 9.6 oz) 65.3 kg (143 lb 14.4 oz) 65.9 kg (145 lb 4.8 oz)    Examination:  General exam: No acute distress, calm and comfortable  Respiratory system: No wheezing, no rhonchi  Cardiovascular system: S1 & S2 heard, Rate controlled  Gastrointestinal system: (+) BS, no distention, no tenderness  Central nervous system: Nonfocal  Extremities: No swelling, palpable pulses  Skin: ecchymoses on LE, some UE Psychiatry: Normal mood and behavior, no agitation   Data Reviewed: I have personally reviewed following labs and imaging studies  CBC:  Recent Labs Lab 07/14/16 1408 07/14/16 2204 07/16/16 0922 07/17/16 0312  WBC 26.3* 24.6* 16.9* 17.9*  NEUTROABS 21.5* 23.0*  --   --   HGB 10.4* 10.2* 10.4* 10.0*  HCT 32.3* 31.2* 31.1* 29.0*  MCV 93.9 94.0 92.6 90.6  PLT 186 169 193 563   Basic Metabolic Panel:  Recent Labs Lab 07/14/16 1408 07/14/16 2204 07/16/16 0922 07/17/16 0312  NA 138 136 141 139  K 4.7 4.7 4.3 5.0  CL 102 104 108 106  CO2 _1 GLUCOSE 107* 144* 112* 167*  BUN 56* 56* 33* 32*  CREATININE 2.68* 2.46* 1.69* 1.54*  CALCIUM 9.5 9.1 9.6 9.2   GFR: Estimated Creatinine Clearance: 27.9 mL/min (by C-G formula based on SCr of 1.54 mg/dL (H)). Liver Function Tests:  Recent  Labs Lab 07/14/16 1408 07/14/16 2204  AST 22 21  ALT 18 19  ALKPHOS 34* 37*  BILITOT 0.6 0.5  PROT 5.7* 5.6*  ALBUMIN 3.0* 2.9*   No results for input(s): LIPASE, AMYLASE in the last 168 hours. No results for input(s): AMMONIA in the last 168 hours. Coagulation Profile:  Recent Labs Lab 07/14/16 1408 07/14/16 2204  INR 1.06 1.11   Cardiac Enzymes: No results for input(s): CKTOTAL, [name], [name], TROPONINI in the last 168 hours. BNP (last 3 results) No results for input(s): PROBNP in the last 8760  hours. HbA1C: No results for input(s): HGBA1C in the last 72 hours. CBG:  Recent Labs Lab 07/15/16 0754 07/16/16 0556 07/17/16 0648  GLUCAP 104* 105* 136*   Lipid Profile: No results for input(s): CHOL, [name], [name], [name], [name], LDLDIRECT in the last 72 hours. Thyroid Function Tests:  Recent Labs  07/14/16 2204  TSH 0.554   Anemia Panel: No results for input(s): VITAMINB12, FOLATE, FERRITIN, TIBC, IRON, RETICCTPCT in the last 72 hours. Urine analysis:    Component Value Date/Time   COLORURINE YELLOW 07/14/2016 1820   APPEARANCEUR CLEAR 07/14/2016 1820   LABSPEC 1.019 07/14/2016 1820   LABSPEC 1.015 01/06/2015 1542   PHURINE 5.0 07/14/2016 1820   GLUCOSEU NEGATIVE 07/14/2016 1820   GLUCOSEU Negative 01/06/2015 1542   HGBUR NEGATIVE 07/14/2016 1820   HGBUR negative 12/03/2008 1113   BILIRUBINUR NEGATIVE 07/14/2016 1820   BILIRUBINUR Negative 01/06/2015 [place] 07/14/2016 1820   PROTEINUR 30 (A) 07/14/2016 1820   UROBILINOGEN 0.2 01/06/2015 1542   NITRITE NEGATIVE 07/14/2016 1820   LEUKOCYTESUR TRACE (A) 07/14/2016 1820   LEUKOCYTESUR Negative 01/06/2015 1542   Sepsis Labs: _0 (procalcitonin:4,lacticidven:4)   ) Recent Results (from the past 240 hour(s))  Culture, blood (Routine x 2)     Status: None (Preliminary result)   Collection Time: 07/14/16  2:08 PM  Result Value Ref Range Status   Specimen Description BLOOD RIGHT  ANTECUBITAL  Final   Special Requests BOTTLES DRAWN AEROBIC AND ANAEROBIC 5CC  Final   Culture NO GROWTH 3 DAYS  Final   Report Status PENDING  Incomplete  Culture, blood (Routine x 2)     Status: None (Preliminary result)   Collection Time: 07/14/16  2:34 PM  Result Value Ref Range Status   Specimen Description BLOOD RIGHT FOREARM  Final   Special Requests BOTTLES DRAWN AEROBIC ONLY 4CC  Final   Culture NO GROWTH 3 DAYS  Final   Report Status PENDING  Incomplete  Urine culture     Status: Abnormal   Collection Time: 07/14/16  6:20 PM  Result Value Ref Range Status   Specimen Description URINE, RANDOM  Final   Special Requests NONE  Final   Culture <10,000 COLONIES/mL INSIGNIFICANT GROWTH (A)  Final   Report Status 07/16/2016 FINAL  Final      Radiology Studies: Dg Chest 2 View Result Date: 07/14/2016 1. Cardiomegaly. Bilateral from interstitial prominence and bilateral pleural effusions suggesting congestive heart failure. Bilateral pneumonitis cannot be excluded .   Ct Head Wo Contrast Result Date: 07/14/2016 Chronic atrophic and ischemic changes without acute abnormality.    Scheduled Meds: . azithromycin  500 mg Intravenous Q24H  . cefTRIAXone (ROCEPHIN)  IV  1 g Intravenous Q24H  . clopidogrel  75 mg Oral QHS  . clozapine  300 mg Oral QHS  . isosorbide mononitrate  60 mg Oral Daily  . levothyroxine  75 mcg Oral QAC breakfast  . loratadine  10 mg Oral Daily  . mouth rinse  15 mL Mouth Rinse BID  . metoprolol succinate  100 mg Oral Q breakfast  . multivitamin with minerals  1 tablet Oral QHS  . pantoprazole  40 mg Oral QAC breakfast  . pravastatin  20 mg Oral QHS  . predniSONE  10 mg Oral Q breakfast   Continuous Infusions:   LOS: 3 days    Time spent: 25 minutes  Greater than 50% of the time spent on counseling and coordinating the care.   Leisa Lenz, MD [facility]  Pager [phone]  If 7PM-7AM, please contact  night-coverage [email or web address] Password [email or web address] 07/17/2016, 1:22 PM

## 2016-07-18 DIAGNOSIS — E039 Hypothyroidism, unspecified: Secondary | ICD-10-CM

## 2016-07-18 LAB — BASIC METABOLIC PANEL
ANION GAP: 8 (ref 5–15)
BUN: 27 mg/dL — ABNORMAL HIGH (ref 6–20)
CHLORIDE: 103 mmol/L (ref 101–111)
CO2: 29 mmol/L (ref 22–32)
CREATININE: 1.34 mg/dL — AB (ref 0.44–1.00)
Calcium: 9.9 mg/dL (ref 8.9–10.3)
GFR calc non Af Amer: 37 mL/min — ABNORMAL LOW (ref >60)
GFR, EST AFRICAN AMERICAN: 43 mL/min — AB (ref >60)
Glucose, Bld: 113 mg/dL — ABNORMAL HIGH (ref 65–99)
POTASSIUM: 4.3 mmol/L (ref 3.5–5.1)
SODIUM: 140 mmol/L (ref 135–145)

## 2016-07-18 LAB — CBC
HEMATOCRIT: 32.3 % — AB (ref 36.0–46.0)
HEMOGLOBIN: 10.6 g/dL — AB (ref 12.0–15.0)
MCH: 30 pg (ref 26.0–34.0)
MCHC: 32.8 g/dL (ref 30.0–36.0)
MCV: 91.5 fL (ref 78.0–100.0)
Platelets: 223 10*3/uL (ref 150–400)
RBC: 3.53 MIL/uL — AB (ref 3.87–5.11)
RDW: 12.9 % (ref 11.5–15.5)
WBC: 18.1 10*3/uL — AB (ref 4.0–10.5)

## 2016-07-18 LAB — GLUCOSE, CAPILLARY: GLUCOSE-CAPILLARY: 97 mg/dL (ref 65–99)

## 2016-07-18 MED ORDER — DILTIAZEM HCL ER COATED BEADS 180 MG PO CP24
180.0000 mg | ORAL_CAPSULE | Freq: Every day | ORAL | Status: DC
  Administered 2016-07-18 – 2016-07-19 (×2): 180 mg via ORAL
  Filled 2016-07-18 (×2): qty 1

## 2016-07-18 MED ORDER — PROCHLORPERAZINE EDISYLATE 5 MG/ML IJ SOLN
10.0000 mg | Freq: Four times a day (QID) | INTRAMUSCULAR | Status: DC | PRN
  Filled 2016-07-18: qty 2

## 2016-07-18 MED ORDER — ASPIRIN EC 81 MG PO TBEC
81.0000 mg | DELAYED_RELEASE_TABLET | Freq: Every day | ORAL | Status: DC
  Administered 2016-07-18 – 2016-07-19 (×2): 81 mg via ORAL
  Filled 2016-07-18 (×2): qty 1

## 2016-07-18 MED ORDER — AMOXICILLIN-POT CLAVULANATE 875-125 MG PO TABS
1.0000 | ORAL_TABLET | Freq: Two times a day (BID) | ORAL | Status: DC
  Administered 2016-07-18 – 2016-07-19 (×3): 1 via ORAL
  Filled 2016-07-18 (×3): qty 1

## 2016-07-18 MED ORDER — TIOTROPIUM BROMIDE MONOHYDRATE 18 MCG IN CAPS
18.0000 ug | ORAL_CAPSULE | Freq: Every day | RESPIRATORY_TRACT | Status: DC
  Administered 2016-07-18 – 2016-07-19 (×2): 18 ug via RESPIRATORY_TRACT
  Filled 2016-07-18: qty 5

## 2016-07-18 MED ORDER — MOMETASONE FURO-FORMOTEROL FUM 200-5 MCG/ACT IN AERO
2.0000 | INHALATION_SPRAY | Freq: Two times a day (BID) | RESPIRATORY_TRACT | Status: DC
  Administered 2016-07-18 – 2016-07-19 (×3): 2 via RESPIRATORY_TRACT
  Filled 2016-07-18: qty 8.8

## 2016-07-18 NOTE — Progress Notes (Signed)
Patient complaining of right sided headache. Tylenol 1,000 mg  given .Patient stated it's a new onset. Paged MD,. Awaiting for response.

## 2016-07-18 NOTE — Progress Notes (Signed)
PROGRESS NOTE    Rebecca [name]  AOZ:[id number] DOB: 1941-02-18 DOA: 07/14/2016 PCP: [name], MD    Brief Narrative:  76 y.o.femalewith medical history significant for recently discovered IgG lambda MGUS, memory loss, PMR (on prednisone), HTN. Pt presented to ED with her daughter who was concerned about her mother, this am she had difficult time waking her up. She reported last night pt took showed and usually she turns the heat on but since she was out and her daughter stayed with the grandmother she forgot to do that. By the time pt daughter came, pt was shaking vigorously, she did not have a fever and her oxygen was good. The following morning she could not get her aroused and she checked O2 sat which was in 80's. She also noticed she had more labored breathing.   n ED, BP was 110/44, HR was 95, RR 25, T max was 100.74F, oxygen saturation was 97% on room air. Blood work showed WBC count 26.3, hemoglobin 10.4, Cr 2.68 (Cr 6 months ago was 2.1). CXR showed cardiomegaly, bilateral interstitial prominence and bilateral pleural effusions suggesting congestive heart failure. Bilateral pneumonitis cannot be excluded. CT head showed chronic ischemic changes, no acute intracranial findings. She was started on empiric antibiotics s=for sepsis thought to be due to pneumonia   Assessment & Plan:   Principal Problem:   Sepsis due to pneumonia [facility]) Active Problems:   Hypothyroidism   Dyslipidemia   Anemia in chronic renal disease   Schizoaffective disorder (HCC)   GASTROESOPHAGEAL REFLUX, NO ESOPHAGITIS   Polymyalgia rheumatica (HCC)   COPD exacerbation (HCC)   History of TIA - May 2015- Plavix   Leukocytosis   CKD stage 4, GFR 15-29 ml/min    Coronary artery disease involving native coronary artery of native heart without angina pectoris   Benign essential HTN   Acute encephalopathy   Pressure injury of skin  Sepsis due to pneumonia (HCC) / Leukocytosis - Sepsis criteria met on  admission with fever, tachycardia, tachypnea, leukocytosis and source of infection likely pneumonia  - Blood and urine cx showed no growth  - Resp cx not collected, unable to collect sputum - Strep and influenza are negative - Legionella is negative   - Will discontinue IV azithromycin and rocephin. - Placed on oral Augmentin to complete a 7-10 day course of antibiotic treatment. - Please note that CXR on admission with possible CHF; looking at previous ECHO in 07/2015 pt had normal EF, no evidence of volume overload on PE. She take lasix for as needed edema but lasix on hold due to renal insufficiency and hypotension   Active Problems: Acute metabolic encephalopathy - Due to sepsis and history of memory loss - Clinical improvement, and likely close to baseline. - Per PT, HH recommended and orders placed 2/2 - Narrow IV antibiotics to oral antibiotics to completely close of antibiotic treatment.  Acute COPD exacerbation ([place]) - Continue duoneb every 4 hours as needed for shortness of breath as needed  - Resume home regimen of Advair and Spiriva.  - Follow.  Hypothyroidism - Continue synthroid - TSH is WNL  Dyslipidemia - Continue pravastatin  Anemia in chronic renal disease - Hemoglobin stable  Benign essential HTN - BP 157/89--185/77 - Continue Imdur and metoprolol. Resume home dose Cardizem for better blood pressure control.  Schizoaffective disorder (HCC) - Continue clozaril   GASTROESOPHAGEAL REFLUX, NO ESOPHAGITIS - Continue protonix  Polymyalgia rheumatica (HCC) - Continue home dose prednisone   History of TIA -  May 2015 - Continue aspirin and plavix for secondary stroke prevention.  CKD stage 4, GFR 15-29 ml/min  - Cr about 6 months ago was 2.1 - Cr elevated on this admission above baseline value likely from sepsis and lasix - Lasix remains on hold - Cr 1.34 this am  Coronary artery disease involving native coronary artery  of native heart without angina pectoris - Currently stable. Patient denies any chest pain. Continue plavix, aspirin, Imdur, Toprol-XL, Pravachol. Will resume home regimen of Cardizem. Outpatient follow-up.  Right buttock pressure injury - Right inner buttock with irregular shaped patchy areas of deep tissue injury; dark reddish purple and total affected area is 1X5cm - Dressing procedure/placement/frequency: Foam dressing to protect and promote healing. If patient frequently incontinent, then per WOC leave foam dressing off and apply barrier cream PRN to repel moisture and protect skin. - Appreciate WOC assessment   Leukocytosis Likely acute on chronic due to worsening pneumonia. Patient on chronic steroids. Leukocytosis stable from yesterday. Leukocytosis has improved since admission. Patient currently afebrile. If no worsening of leukocytosis, that could possibly follow up in the outpatient setting.    DVT prophylaxis: SCDs Code Status: Full Family Communication: Updated patient. Updated daughter via telephone. Disposition Plan: Home tomorrow 07/19/2016 if continued improvement with leukocytosis with no worsening.   Consultants:   WOC  Procedures:   CT head 07/14/2016  Chest x-ray 07/14/2016    Antimicrobials:   IV azithromycin 07/14/2016>>>>> 07/18/2016  IV Rocephin 07/14/2016>>>> 07/18/2016  Oral Augmentin 07/18/2016   Subjective: Patient laying in bed. Patient states she's embedded on on admission. No chest pain. No shortness of breath. Patient with complaints of headache.  Objective: Vitals:   07/18/16 0400 07/18/16 0600 07/18/16 0946 07/18/16 1040  BP: (!) 184/80 (!) 174/72  (!) 166/90  Pulse: 72 70 69 77  Resp:   16 18  Temp:      TempSrc:      SpO2:   98% 95%  Weight:      Height:        Intake/Output Summary (Last 24 hours) at 07/18/16 1206 Last data filed at 07/18/16 0900  Gross per 24 hour  Intake              680 ml  Output             2584  ml  Net            -1904 ml   Filed Weights   07/16/16 0645 07/17/16 0549 07/18/16 0351  Weight: 65.3 kg (143 lb 14.4 oz) 65.9 kg (145 lb 4.8 oz) 64.9 kg (143 lb 1.6 oz)    Examination:  General exam: Appears calm and comfortable  Respiratory system: Clear to auscultation bilaterally. No wheezing. Cardiovascular system: Regular rate and rhythm.No JVD, murmurs, rubs, gallops or clicks. No pedal edema. Gastrointestinal system: Abdomen is nondistended, soft and nontender. No organomegaly or masses felt. Normal bowel sounds heard. Central nervous system: Alert and oriented. No focal neurological deficits. Extremities: Symmetric 5 x 5 power. Skin: No rashes, lesions or ulcers Psychiatry: Judgement and insight appear normal. Mood & affect appropriate.     Data Reviewed: I have personally reviewed following labs and imaging studies  CBC:  Recent Labs Lab 07/14/16 1408 07/14/16 2204 07/16/16 0922 07/17/16 0312 07/18/16 0447  WBC 26.3* 24.6* 16.9* 17.9* 18.1*  NEUTROABS 21.5* 23.0*  --   --   --   HGB 10.4* 10.2* 10.4* 10.0* 10.6*  HCT 32.3* 31.2* 31.1* 29.0* 32.3*  MCV 93.9 94.0 92.6 90.6 91.5  PLT 186 169 193 207 518   Basic Metabolic Panel:  Recent Labs Lab 07/14/16 1408 07/14/16 2204 07/16/16 0922 07/17/16 0312 07/18/16 0447  NA 138 136 141 139 140  K 4.7 4.7 4.3 5.0 4.3  CL 102 104 108 106 103  CO2 _0 GLUCOSE 107* 144* 112* 167* 113*  BUN 56* 56* 33* 32* 27*  CREATININE 2.68* 2.46* 1.69* 1.54* 1.34*  CALCIUM 9.5 9.1 9.6 9.2 9.9   GFR: Estimated Creatinine Clearance: 31.9 mL/min (by C-G formula based on SCr of 1.34 mg/dL (H)). Liver Function Tests:  Recent Labs Lab 07/14/16 1408 07/14/16 2204  AST 22 21  ALT 18 19  ALKPHOS 34* 37*  BILITOT 0.6 0.5  PROT 5.7* 5.6*  ALBUMIN 3.0* 2.9*   No results for input(s): LIPASE, AMYLASE in the last 168 hours. No results for input(s): AMMONIA in the last 168 hours. Coagulation Profile:  Recent  Labs Lab 07/14/16 1408 07/14/16 2204  INR 1.06 1.11   Cardiac Enzymes: No results for input(s): CKTOTAL, [name], [name], TROPONINI in the last 168 hours. BNP (last 3 results) No results for input(s): PROBNP in the last 8760 hours. HbA1C: No results for input(s): HGBA1C in the last 72 hours. CBG:  Recent Labs Lab 07/15/16 0754 07/16/16 0556 07/17/16 0648 07/18/16 0636  GLUCAP 104* 105* 136* 97   Lipid Profile: No results for input(s): CHOL, [name], [name], [name], [name], LDLDIRECT in the last 72 hours. Thyroid Function Tests: No results for input(s): TSH, T4TOTAL, FREET4, T3FREE, THYROIDAB in the last 72 hours. Anemia Panel: No results for input(s): VITAMINB12, FOLATE, FERRITIN, TIBC, IRON, RETICCTPCT in the last 72 hours. Sepsis Labs:  Recent Labs Lab 07/14/16 1423 07/14/16 2204 07/15/16 0110  PROCALCITON  --  9.82  --   LATICACIDVEN 0.90 1.0 1.0    Recent Results (from the past 240 hour(s))  Culture, blood (Routine x 2)     Status: None (Preliminary result)   Collection Time: 07/14/16  2:08 PM  Result Value Ref Range Status   Specimen Description BLOOD RIGHT ANTECUBITAL  Final   Special Requests BOTTLES DRAWN AEROBIC AND ANAEROBIC 5CC  Final   Culture NO GROWTH 3 DAYS  Final   Report Status PENDING  Incomplete  Culture, blood (Routine x 2)     Status: None (Preliminary result)   Collection Time: 07/14/16  2:34 PM  Result Value Ref Range Status   Specimen Description BLOOD RIGHT FOREARM  Final   Special Requests BOTTLES DRAWN AEROBIC ONLY 4CC  Final   Culture NO GROWTH 3 DAYS  Final   Report Status PENDING  Incomplete  Urine culture     Status: Abnormal   Collection Time: 07/14/16  6:20 PM  Result Value Ref Range Status   Specimen Description URINE, RANDOM  Final   Special Requests NONE  Final   Culture <10,000 COLONIES/mL INSIGNIFICANT GROWTH (A)  Final   Report Status 07/16/2016 FINAL  Final         Radiology Studies: No results  found.      Scheduled Meds: . amoxicillin-clavulanate  1 tablet Oral Q12H  . clopidogrel  75 mg Oral QHS  . clozapine  300 mg Oral QHS  . diltiazem  180 mg Oral Q breakfast  . isosorbide mononitrate  60 mg Oral Daily  . levothyroxine  75 mcg Oral QAC breakfast  . loratadine  10 mg Oral Daily  . mouth rinse  15 mL  Mouth Rinse BID  . metoprolol succinate  100 mg Oral Q breakfast  . mometasone-formoterol  2 puff Inhalation BID  . multivitamin with minerals  1 tablet Oral QHS  . pantoprazole  40 mg Oral QAC breakfast  . pravastatin  20 mg Oral QHS  . predniSONE  10 mg Oral Q breakfast  . tiotropium  18 mcg Inhalation Daily   Continuous Infusions:   LOS: 4 days    Time spent: 84 mins    Cynthia Stainback, MD [facility] Pager [phone]  If 7PM-7AM, please contact night-coverage [email or web address] Password [email or web address] 07/18/2016, 12:06 PM

## 2016-07-19 LAB — CULTURE, BLOOD (ROUTINE X 2)
CULTURE: NO GROWTH
Culture: NO GROWTH

## 2016-07-19 LAB — BASIC METABOLIC PANEL
Anion gap: 10 (ref 5–15)
BUN: 24 mg/dL — AB (ref 6–20)
CHLORIDE: 102 mmol/L (ref 101–111)
CO2: 26 mmol/L (ref 22–32)
Calcium: 9 mg/dL (ref 8.9–10.3)
Creatinine, Ser: 1.43 mg/dL — ABNORMAL HIGH (ref 0.44–1.00)
GFR calc Af Amer: 40 mL/min — ABNORMAL LOW (ref >60)
GFR calc non Af Amer: 34 mL/min — ABNORMAL LOW (ref >60)
Glucose, Bld: 141 mg/dL — ABNORMAL HIGH (ref 65–99)
POTASSIUM: 4.1 mmol/L (ref 3.5–5.1)
SODIUM: 138 mmol/L (ref 135–145)

## 2016-07-19 LAB — CBC
HCT: 30.6 % — ABNORMAL LOW (ref 36.0–46.0)
HEMOGLOBIN: 10.2 g/dL — AB (ref 12.0–15.0)
MCH: 30.4 pg (ref 26.0–34.0)
MCHC: 33.3 g/dL (ref 30.0–36.0)
MCV: 91.3 fL (ref 78.0–100.0)
Platelets: 202 10*3/uL (ref 150–400)
RBC: 3.35 MIL/uL — AB (ref 3.87–5.11)
RDW: 12.9 % (ref 11.5–15.5)
WBC: 16.6 10*3/uL — ABNORMAL HIGH (ref 4.0–10.5)

## 2016-07-19 LAB — GLUCOSE, CAPILLARY: Glucose-Capillary: 107 mg/dL — ABNORMAL HIGH (ref 65–99)

## 2016-07-19 LAB — URINE CULTURE: CULTURE: NO GROWTH

## 2016-07-19 MED ORDER — ASPIRIN 81 MG PO TBEC: 81.0000 mg | DELAYED_RELEASE_TABLET | Freq: Every day | ORAL | 0 refills | Status: DC

## 2016-07-19 MED ORDER — AMOXICILLIN-POT CLAVULANATE 875-125 MG PO TABS: 1.0000 | ORAL_TABLET | Freq: Two times a day (BID) | ORAL | 0 refills | Status: DC

## 2016-07-19 NOTE — Consult Note (Signed)
   [facility] CM Inpatient Consult   07/19/2016  Rebecca [name] April 12, 1941 [id number]   Patient is assessed for [facility] as for the [facility] ACO Registry.  Chart reviewed and reveals the patient is Rebecca [name] is a 76 y.o. female admitted on 07/14/2016 with sepsis due to CAP. Medical history significant for recently discovered IgG lambda MGUS, memory loss, PMR (on prednisone), HTN. Met with the patient at the bedside.  Patient has been listed for EMMI pneumonia follow up calls.  Explained to the patient regarding [facility]'s role for post hospital follow up.  Patient was provided a brochure with [place] contact information.  She endorses Dr. [name] as her primary care provider. Patient complained of a headache and spoke with her nurse who has given her medication.  For questions, please contact:  Natividad Brood, RN BSN [facility] Liaison  [phone] business mobile phone Toll free office [phone]

## 2016-07-19 NOTE — Discharge Summary (Signed)
Physician Discharge Summary  Rebecca [name] WUX:[id number] DOB: Oct 28, 1940 DOA: 07/14/2016  PCP: [name], MD  Admit date: 07/14/2016 Discharge date: 07/19/2016  Recommendations for Outpatient Follow-up:  Continue Augmentin for 5 days on discharge  Discharge Diagnoses:  Principal Problem:   Sepsis due to pneumonia [facility]) Active Problems:   COPD exacerbation ([place])   Leukocytosis   Acute encephalopathy   Hypothyroidism   Dyslipidemia   Anemia in chronic renal disease   Schizoaffective disorder (HCC)   GASTROESOPHAGEAL REFLUX, NO ESOPHAGITIS   Polymyalgia rheumatica (HCC)   History of TIA - May 2015- Plavix   CKD stage 4, GFR 15-29 ml/min    Coronary artery disease involving native coronary artery of native heart without angina pectoris   Benign essential HTN   Pressure injury of skin    Discharge Condition: stable   Diet recommendation: as tolerated   History of present illness:  76 y.o.femalewith medical history significant for recently discovered IgG lambda MGUS, memory loss, PMR (on prednisone), HTN. Pt presented to ED with her daughter who was concerned about her mother, this am she had difficult time waking her up. She reported last night pt took showed and usually she turns the heat on but since she was out and her daughter stayed with the grandmother she forgot to do that. By the time pt daughter came, pt was shaking vigorously, she did not have a fever and her oxygen was good. The following morning she could not get her aroused and she checked O2 sat which was in 80's. She also noticed she had more labored breathing.   n ED, BP was 110/44, HR was 95, RR 25, T max was 100.27F, oxygen saturation was 97% on room air. Blood work showed WBC count 26.3, hemoglobin 10.4, Cr 2.68 (Cr 6 months ago was 2.1). CXR showed cardiomegaly, bilateral interstitial prominence and bilateral pleural effusions suggesting congestive heart failure. Bilateral pneumonitis cannot be excluded. CT  head showed chronic ischemic changes, no acute intracranial findings. She was started on empiric antibiotics for sepsis thought to be due to pneumonia  Hospital Course:   Sepsis due to pneumonia ([place]) / Leukocytosis - Sepsis criteria met on admission with fever, tachycardia, tachypnea, leukocytosis and source of infection likely pneumonia  - Blood and urine cx showed no growth  - Resp cx not collected, unable to collect sputum - Strep and influenza are negative - Legionella is negative  - Will discontinue IV azithromycin and rocephin. - Started Augmentin on 2/4 and she will continue 5 more days of abx on discharge. No fevers and her WBC count is elevated from steroids most likely   Active Problems: Acute metabolic encephalopathy - Due to sepsis and history of memory loss - Clinical improvement, and likely close to baseline. - Per PT, HH recommended and orders placed 2/2 - Continue Augmentin for 5 days on discharge   Acute COPD exacerbation ([place]) - Continue home BD - Stable   Hypothyroidism - Continue synthroid - TSH is WNL  Dyslipidemia - Continue pravastatin  Anemia in chronic renal disease - Hemoglobin stable  Benign essential HTN - Continue Imdur and metoprolol, Cardizem   Schizoaffective disorder (HCC) - Continue clozaril   GASTROESOPHAGEAL REFLUX, NO ESOPHAGITIS - Continue protonix  Polymyalgia rheumatica (HCC) - Continue home dose prednisone   History of TIA - May 2015 - Continue aspirin and plavix for secondary stroke prevention.  CKD stage 4, GFR 15-29 ml/min  - Cr about 6 months ago was 2.1 - Cr elevated on  this admission above baseline value likely from sepsis and lasix - Lasix remains on hold (she was taking this as needed for edema and currently no edema)  Coronary artery disease involving native coronary artery of native heart without angina pectoris - Currently stable.  - ontinue plavix, aspirin, Imdur,  Toprol-XL, Pravachol, Cardizem  Right buttockpressure injury - Right inner buttock with irregular shaped patchy areas of deep tissue injury; dark reddish purple and total affected area is 1X5cm - Dressing procedure/placement/frequency: Foam dressing to protect and promote healing. If patient frequently incontinent, then per WOC leave foam dressing off and apply barrier cream PRN to repel moisture and protect skin. - Appreciate WOC assessment   Leukocytosis - Likely acute on chronic due to worsening pneumonia. Patient on chronic steroids. - Leukocytosis stable    DVT prophylaxis: SCDs Code Status: Full Family Communication: Updated patient. Updated daughter via telephone.    Consultants:   WOC  Procedures:   CT head 07/14/2016  Chest x-ray 07/14/2016    Antimicrobials:   IV azithromycin 07/14/2016>>>>> 07/18/2016  IV Rocephin 07/14/2016>>>> 07/18/2016  Oral Augmentin 07/18/2016 for 5 days on discharge    Signed:  Leisa Lenz, MD  [facility] 07/19/2016, 12:51 PM  Pager #: [phone]  Time spent in minutes: less than 30 minutes    Discharge Exam: Vitals:   07/19/16 0544 07/19/16 1208  BP: (!) 160/68 140/61  Pulse: 75 72  Resp: 18 18  Temp: 97.8 F (36.6 C) 98.1 F (36.7 C)   Vitals:   07/19/16 0544 07/19/16 0900 07/19/16 0957 07/19/16 1208  BP: (!) 160/68   140/61  Pulse: 75   72  Resp: 18   18  Temp: 97.8 F (36.6 C)   98.1 F (36.7 C)  TempSrc: Oral   Oral  SpO2: 99% 96% 96% 97%  Weight:      Height:        General: Pt is alert, follows commands appropriately, not in acute distress Cardiovascular: Regular rate and rhythm, S1/S2 + Respiratory: Clear to auscultation bilaterally, no wheezing, no crackles, no rhonchi Abdominal: Soft, non tender, non distended, bowel sounds +, no guarding Extremities: no edema, no cyanosis, pulses palpable bilaterally DP and PT Neuro: Grossly nonfocal  Discharge  Instructions  Discharge Instructions    Call MD for:  persistant nausea and vomiting    Complete by:  As directed    Call MD for:  redness, tenderness, or signs of infection (pain, swelling, redness, odor or green/yellow discharge around incision site)    Complete by:  As directed    Call MD for:  severe uncontrolled pain    Complete by:  As directed    Diet - low sodium heart healthy    Complete by:  As directed    Discharge instructions    Complete by:  As directed    Continue Augmentin for 5 days on discharge   Increase activity slowly    Complete by:  As directed      Allergies as of 07/19/2016      Reactions   Levaquin [levofloxacin] Other (See Comments)   Ruptured tendon   Tape Other (See Comments)   PATIENT'S SKIN IS VERY, VERY THIN AND WILL TEAR AND BRUISE EASILY; PLEASE USE AN ALTERNATIVE (SOMETHING OTHER THAN TAPE)      Medication List    STOP taking these medications   furosemide 20 MG tablet Commonly known as:  LASIX     TAKE these medications   acetaminophen 500 MG  tablet Commonly known as:  TYLENOL Take 1,000 mg by mouth every 6 (six) hours as needed (pain).   amoxicillin-clavulanate 875-125 MG tablet Commonly known as:  AUGMENTIN Take 1 tablet by mouth every 12 (twelve) hours.   aspirin 81 MG EC tablet Take 1 tablet (81 mg total) by mouth daily. Start taking on:  07/20/2016   cetirizine 10 MG tablet Commonly known as:  ZYRTEC Take 10 mg by mouth at bedtime.   clopidogrel 75 MG tablet Commonly known as:  PLAVIX Take 1 tablet (75 mg total) by mouth daily with breakfast. What changed:  when to take this   clozapine 200 MG tablet Commonly known as:  CLOZARIL Take 300 mg by mouth at bedtime. ]   COLACE 100 MG capsule Generic drug:  docusate sodium Take 200 mg by mouth at bedtime.   denosumab 60 MG/ML Soln injection Commonly known as:  PROLIA Inject 60 mg into the skin every 6 (six) months. Administer in upper arm, thigh, or abdomen   diltiazem  180 MG 24 hr tablet Commonly known as:  CARDIZEM LA Take 180 mg by mouth daily with breakfast.   FISH OIL PO Take 1 capsule by mouth at bedtime.   Fluticasone-Salmeterol 250-50 MCG/DOSE Aepb Commonly known as:  ADVAIR Inhale 1 puff into the lungs at bedtime.   isosorbide mononitrate 60 MG 24 hr tablet Commonly known as:  IMDUR Take 1 tablet (60 mg total) by mouth daily.   levothyroxine 75 MCG tablet Commonly known as:  SYNTHROID, LEVOTHROID Take 75 mcg by mouth daily before breakfast.   metoprolol succinate 100 MG 24 hr tablet Commonly known as:  TOPROL-XL Take 100 mg by mouth daily with breakfast. Take with or immediately following a meal.   nitroGLYCERIN 0.4 MG SL tablet Commonly known as:  NITROSTAT Place 1 tablet (0.4 mg total) under the tongue every 5 (five) minutes as needed. For chest pain   ONE-A-DAY MENS 50+ ADVANTAGE Tabs Take 1 tablet by mouth at bedtime.   pantoprazole 40 MG tablet Commonly known as:  PROTONIX Take 1 tablet (40 mg total) by mouth daily. What changed:  when to take this   polyethylene glycol powder powder Commonly known as:  GLYCOLAX/MIRALAX Take 17 g by mouth daily. MIX AND DRINK   pravastatin 20 MG tablet Commonly known as:  PRAVACHOL Take 20 mg by mouth at bedtime.   predniSONE 10 MG tablet Commonly known as:  DELTASONE Take 10 mg by mouth daily with breakfast.   PROAIR HFA 108 (90 Base) MCG/ACT inhaler Generic drug:  albuterol Inhale 2 puffs into the lungs every 6 (six) hours as needed for wheezing or shortness of breath.   SM CRANBERRY 300 MG tablet Generic drug:  Cranberry Take 300 mg by mouth at bedtime.   SPIRIVA RESPIMAT 2.5 MCG/ACT Aers Generic drug:  Tiotropium Bromide Monohydrate Inhale 2 puffs into the lungs every morning.   temazepam 15 MG capsule Commonly known as:  RESTORIL Take 15 mg by mouth at bedtime as needed for sleep.   traMADol 50 MG tablet Commonly known as:  ULTRAM Take 100 mg by mouth 2 (two) times  daily as needed (for breakthrough pain).   traMADol 300 MG 24 hr tablet Commonly known as:  ULTRAM-ER Take 300 mg by mouth every morning.   Vitamin D (Ergocalciferol) 50000 units Caps capsule Commonly known as:  DRISDOL Take 1 capsule (50,000 Units total) by mouth every 7 (seven) days.  Durable Medical Equipment        Start     Ordered   07/16/16 1244  For home use only DME Walker  Once    Comments:  Rollater/ rolling walker with seat  Question:  Patient needs a walker to treat with the following condition  Answer:  COPD (chronic obstructive pulmonary disease) ([place])   07/16/16 1244     Follow-up Information    [facility].   Why:  They will do your home health care at your home Contact information: [place] [address] [phone]        [name], MD. Schedule an appointment as soon as possible for a visit in 1 week(s).   Specialty:  Internal Medicine Contact information: [place] [address] [phone]            The results of significant diagnostics from this hospitalization (including imaging, microbiology, ancillary and laboratory) are listed below for reference.    Significant Diagnostic Studies: Dg Chest 2 View  Result Date: 07/14/2016 CLINICAL DATA:  All noted status.  Fever. EXAM: CHEST  2 VIEW COMPARISON:  10/16/2013 . FINDINGS: Mediastinum hilar structures normal. Cardiomegaly. Diffuse bilateral from interstitial prominence with bilateral pleural effusions. Findings suggest congestive heart failure. Bilateral pneumonitis cannot be excluded. No pneumothorax . IMPRESSION: 1. Cardiomegaly. Bilateral from interstitial prominence and bilateral pleural effusions suggesting congestive heart failure. Bilateral pneumonitis cannot be excluded . Electronically Signed   By: Marcello Moores  Register   On: 07/14/2016 14:52   Ct Head Wo Contrast  Result Date: 07/14/2016 CLINICAL  DATA:  Altered mental status EXAM: CT HEAD WITHOUT CONTRAST TECHNIQUE: Contiguous axial images were obtained from the base of the skull through the vertex without intravenous contrast. COMPARISON:  04/16/2016 FINDINGS: Brain: Mild atrophic changes and scattered chronic white matter ischemic change is again identified. No findings to suggest acute hemorrhage, acute infarction or space-occupying mass lesion are noted. Vascular: No hyperdense vessel or unexpected calcification. Skull: Normal. Negative for fracture or focal lesion. Sinuses/Orbits: No acute finding. Other: None. IMPRESSION: Chronic atrophic and ischemic changes without acute abnormality. Electronically Signed   By: Inez Catalina M.D.   On: 07/14/2016 15:40    Microbiology: Recent Results (from the past 240 hour(s))  Culture, blood (Routine x 2)     Status: None (Preliminary result)   Collection Time: 07/14/16  2:08 PM  Result Value Ref Range Status   Specimen Description BLOOD RIGHT ANTECUBITAL  Final   Special Requests BOTTLES DRAWN AEROBIC AND ANAEROBIC 5CC  Final   Culture NO GROWTH 4 DAYS  Final   Report Status PENDING  Incomplete  Culture, blood (Routine x 2)     Status: None (Preliminary result)   Collection Time: 07/14/16  2:34 PM  Result Value Ref Range Status   Specimen Description BLOOD RIGHT FOREARM  Final   Special Requests BOTTLES DRAWN AEROBIC ONLY 4CC  Final   Culture NO GROWTH 4 DAYS  Final   Report Status PENDING  Incomplete  Urine culture     Status: Abnormal   Collection Time: 07/14/16  6:20 PM  Result Value Ref Range Status   Specimen Description URINE, RANDOM  Final   Special Requests NONE  Final   Culture <10,000 COLONIES/mL INSIGNIFICANT GROWTH (A)  Final   Report Status 07/16/2016 FINAL  Final  Culture, Urine     Status: None   Collection Time: 07/17/16  6:00 PM  Result Value Ref Range Status  Specimen Description URINE, CLEAN CATCH  Final   Special Requests NONE  Final   Culture NO GROWTH  Final    Report Status 07/19/2016 FINAL  Final     Labs: Basic Metabolic Panel:  Recent Labs Lab 07/14/16 2204 07/16/16 0922 07/17/16 0312 07/18/16 0447 07/19/16 0341  NA 136 141 139 140 138  K 4.7 4.3 5.0 4.3 4.1  CL 104 108 106 103 102  CO2 23 25 26 29 26   GLUCOSE 144* 112* 167* 113* 141*  BUN 56* 33* 32* 27* 24*  CREATININE 2.46* 1.69* 1.54* 1.34* 1.43*  CALCIUM 9.1 9.6 9.2 9.9 9.0   Liver Function Tests:  Recent Labs Lab 07/14/16 1408 07/14/16 2204  AST 22 21  ALT 18 19  ALKPHOS 34* 37*  BILITOT 0.6 0.5  PROT 5.7* 5.6*  ALBUMIN 3.0* 2.9*   No results for input(s): LIPASE, AMYLASE in the last 168 hours. No results for input(s): AMMONIA in the last 168 hours. CBC:  Recent Labs Lab 07/14/16 1408 07/14/16 2204 07/16/16 0922 07/17/16 0312 07/18/16 0447 07/19/16 0341  WBC 26.3* 24.6* 16.9* 17.9* 18.1* 16.6*  NEUTROABS 21.5* 23.0*  --   --   --   --   HGB 10.4* 10.2* 10.4* 10.0* 10.6* 10.2*  HCT 32.3* 31.2* 31.1* 29.0* 32.3* 30.6*  MCV 93.9 94.0 92.6 90.6 91.5 91.3  PLT 186 169 193 207 223 202   Cardiac Enzymes: No results for input(s): CKTOTAL, [name], [name], TROPONINI in the last 168 hours. BNP: BNP (last 3 results)  Recent Labs  08/10/15 2045 08/19/15 1026 07/14/16 1525  BNP 91.0 56.1 159.5*    ProBNP (last 3 results) No results for input(s): PROBNP in the last 8760 hours.  CBG:  Recent Labs Lab 07/15/16 0754 07/16/16 0556 07/17/16 0648 07/18/16 0636 07/19/16 0650  GLUCAP 104* 105* 136* 97 107*

## 2016-07-19 NOTE — Progress Notes (Signed)
Physical Therapy Treatment Patient Details Name: Rebecca [name] MRN: [id number] DOB: 1941-01-20 Today's Date: 07/19/2016    History of Present Illness Rebecca [name] is a 76 y.o. female admitted on 07/14/2016 with sepsis due to CAP. Medical history significant for recently discovered IgG lambda MGUS, memory loss, PMR (on prednisone), HTN    PT Comments    Patient seen for mobility progression. Mobilizing fairly well with both RW and no device. Patient does report some R hip pain limiting activity. Current POC remains appropriate. VSS throughout session.  Follow Up Recommendations  Home health PT;Supervision for mobility/OOB     Equipment Recommendations  None recommended by PT    Recommendations for Other Services       Precautions / Restrictions Precautions Precautions: Fall Restrictions Weight Bearing Restrictions: No    Mobility  Bed Mobility Overal bed mobility: Needs Assistance Bed Mobility: Supine to Sit     Supine to sit: Supervision;HOB elevated     General bed mobility comments: Use of side rail to come to EOB  Transfers Overall transfer level: Needs assistance Equipment used: Rolling walker (2 wheeled) Transfers: Sit to/from Stand Sit to Stand: Min guard         General transfer comment: Min guard for safety, no physical assist required  Ambulation/Gait Ambulation/Gait assistance: Min guard Ambulation Distance (Feet): 120 Feet Assistive device: Rolling walker (2 wheeled) Gait Pattern/deviations: Step-through pattern;Drifts right/left Gait velocity: decreased Gait velocity interpretation: Below normal speed for age/gender General Gait Details: Min guard to supervision for safety, no physical assist required. Ambulated in hall with use of RW (supervsion) for 90 ft, then 30 ft min guard without device. Some instability noted, but no significant LOB at this time. Patient did report increased Right hip pain from arthritic changes.    Stairs             Wheelchair Mobility    Modified Rankin (Stroke Patients Only)       Balance Overall balance assessment: Needs assistance         Standing balance support: During functional activity Standing balance-Leahy Scale: Fair Standing balance comment: able to perform hygiene tasks at sink without difficulty today.                     Cognition Arousal/Alertness: Awake/alert Behavior During Therapy: WFL for tasks assessed/performed Overall Cognitive Status: History of cognitive impairments - at baseline                 General Comments: Pt with short term memory deficits noted per chart. No family present at this time to confirm.     Exercises      General Comments        Pertinent Vitals/Pain Faces Pain Scale: Hurts even more Pain Location: R heel and R hip, headache Pain Descriptors / Indicators: Sore;Grimacing;Aching (reports arthritic pain in R Hip)    Home Living Family/patient expects to be discharged to:: Private residence Living Arrangements: Children Available Help at Discharge: Family;Available 24 hours/day Type of Home: House Home Access: Stairs to enter Entrance Stairs-Rails: Right Home Layout: Two level;Able to live on main level with bedroom/bathroom Home Equipment: Shower seat;Walker - 2 wheels;Walker - 4 wheels;Hospital bed;Bedside commode      Prior Function Level of Independence: Independent with assistive device(s)      Comments: used rollatot when out of the house.  no AD in house, but occasionaly used cane due to R hip pain   PT Goals (current goals can  now be found in the care plan section) Acute Rehab PT Goals Patient Stated Goal: none stated PT Goal Formulation: With patient Time For Goal Achievement: 07/29/16 Potential to Achieve Goals: Good Progress towards PT goals: Progressing toward goals    Frequency    Min 3X/week      PT Plan Current plan remains appropriate    Co-evaluation             End of  Session Equipment Utilized During Treatment: Gait belt Activity Tolerance: Patient tolerated treatment well (modest limitation due to R hip pain) Patient left: in chair;with call bell/phone within reach;with chair alarm set     Time: [id number] PT Time Calculation (min) (ACUTE ONLY): 20 min  Charges:  $Gait Training: 8-22 mins                    G Codes:      Duncan Dull 2016-07-22, 9:28 AM  Alben Deeds, PT DPT  [phone]

## 2016-07-19 NOTE — Care Management Important Message (Signed)
Important Message  Patient Details  Name: NEELI WHITLING MRN: [id number] Date of Birth: Jan 02, 1941   Medicare Important Message Given:  Yes    Kamie Korber [address] 07/19/2016, 4:13 PM

## 2016-07-19 NOTE — Discharge Instructions (Signed)
Amoxicillin; Clavulanic Acid extended-release tablets °What is this medicine? °AMOXICILLIN; CLAVULANIC ACID (a mox i SILL in; KLAV yoo lan ic AS id) is a penicillin antibiotic. It is used to treat certain kinds of bacterial infections. It will not work for colds, flu, or other viral infections. °This medicine may be used for other purposes; ask your health care provider or pharmacist if you have questions. °COMMON BRAND NAME(S): Augmentin XR °What should I tell my health care provider before I take this medicine? °They need to know if you have any of these conditions: °-bowel disease, like colitis °-kidney disease °-liver disease °-mononucleosis °-an unusual or allergic reaction to amoxicillin, penicillin, cephalosporin, other antibiotics, clavulanic acid, other medicines, foods, dyes, or preservatives °-pregnant or trying to get pregnant °-breast-feeding °How should I use this medicine? °Take this medicine by mouth with a full glass of water. Follow the directions on the prescription label. Take at the start of a meal. Do not crush or chew. You may cut this medicine in half at the score line for easier swallowing. Take your medicine at regular intervals. Do not take your medicine more often than directed. Take all of your medicine as directed even if you think you are better. Do not skip doses or stop your medicine early. °Contact your pediatrician or health care professional regarding the use of this medicine in children. This medicine has been used in children as young as 16 years of age. °Overdosage: If you think you have taken too much of this medicine contact a poison control center or emergency room at once. °NOTE: This medicine is only for you. Do not share this medicine with others. °What if I miss a dose? °If you miss a dose, take it as soon as you can. If it is almost time for your next dose, take only that dose. Do not take double or extra doses. °What may interact with this  medicine? °-allopurinol °-anticoagulants °-birth control pills °-methotrexate °-probenecid °This list may not describe all possible interactions. Give your health care provider a list of all the medicines, herbs, non-prescription drugs, or dietary supplements you use. Also tell them if you smoke, drink alcohol, or use illegal drugs. Some items may interact with your medicine. °What should I watch for while using this medicine? °Tell your doctor or health care professional if your symptoms do not improve. °Do not treat diarrhea with over the counter products. Contact your doctor if you have diarrhea that lasts more than 2 days or if it is severe and watery. °If you have diabetes, you may get a false-positive result for sugar in your urine. Check with your doctor or health care professional. °Birth control pills may not work properly while you are taking this medicine. Talk to your doctor about using an extra method of birth control. °What side effects may I notice from receiving this medicine? °Side effects that you should report to your doctor or health care professional as soon as possible: °-allergic reactions like skin rash, itching or hives, swelling of the face, lips, or tongue °-breathing problems °-dark urine °-fever or chills, sore throat °-redness, blistering, peeling or loosening of the skin, including inside the mouth °-seizures °-trouble passing urine or change in the amount of urine °-unusual bleeding, bruising °-unusually weak or tired °-white patches or sores in the mouth or throat °Side effects that usually do not require medical attention (report to your doctor or health care professional if they continue or are bothersome): °-diarrhea °-dizziness °-headache °-nausea, vomiting °-stomach   upset °-vaginal or anal irritation °This list may not describe all possible side effects. Call your doctor for medical advice about side effects. You may report side effects to FDA at 1-800-FDA-1088. °Where should I  keep my medicine? °Keep out of the reach of children. °Store at room temperature below 25 degrees C (77 degrees F). Keep container tightly closed. Throw away any unused medicine after the expiration date. °NOTE: This sheet is a summary. It may not cover all possible information. If you have questions about this medicine, talk to your doctor, pharmacist, or health care provider. °© 2017 [employer] (2007-08-22 14:32:45) ° °

## 2016-07-19 NOTE — Progress Notes (Signed)
Patient alert and oriented. D/c instruction explain and given to the patient daughter, all questions answered. D/c patient home with daughter per order.

## 2016-07-21 DIAGNOSIS — D72829 Elevated white blood cell count, unspecified: Secondary | ICD-10-CM | POA: Diagnosis not present

## 2016-07-21 DIAGNOSIS — N179 Acute kidney failure, unspecified: Secondary | ICD-10-CM | POA: Diagnosis not present

## 2016-07-22 DIAGNOSIS — E875 Hyperkalemia: Secondary | ICD-10-CM | POA: Diagnosis not present

## 2016-07-23 DIAGNOSIS — N184 Chronic kidney disease, stage 4 (severe): Secondary | ICD-10-CM | POA: Diagnosis not present

## 2016-07-23 DIAGNOSIS — G9340 Encephalopathy, unspecified: Secondary | ICD-10-CM | POA: Diagnosis not present

## 2016-07-23 DIAGNOSIS — A403 Sepsis due to Streptococcus pneumoniae: Secondary | ICD-10-CM | POA: Diagnosis not present

## 2016-07-23 DIAGNOSIS — I5030 Unspecified diastolic (congestive) heart failure: Secondary | ICD-10-CM | POA: Diagnosis not present

## 2016-07-23 DIAGNOSIS — I130 Hypertensive heart and chronic kidney disease with heart failure and stage 1 through stage 4 chronic kidney disease, or unspecified chronic kidney disease: Secondary | ICD-10-CM | POA: Diagnosis not present

## 2016-07-23 DIAGNOSIS — E785 Hyperlipidemia, unspecified: Secondary | ICD-10-CM | POA: Diagnosis not present

## 2016-07-23 DIAGNOSIS — Z87891 Personal history of nicotine dependence: Secondary | ICD-10-CM | POA: Diagnosis not present

## 2016-07-23 DIAGNOSIS — E1122 Type 2 diabetes mellitus with diabetic chronic kidney disease: Secondary | ICD-10-CM | POA: Diagnosis not present

## 2016-07-23 DIAGNOSIS — Z7952 Long term (current) use of systemic steroids: Secondary | ICD-10-CM | POA: Diagnosis not present

## 2016-07-23 DIAGNOSIS — Z8673 Personal history of transient ischemic attack (TIA), and cerebral infarction without residual deficits: Secondary | ICD-10-CM | POA: Diagnosis not present

## 2016-07-23 DIAGNOSIS — E669 Obesity, unspecified: Secondary | ICD-10-CM | POA: Diagnosis not present

## 2016-07-23 DIAGNOSIS — D631 Anemia in chronic kidney disease: Secondary | ICD-10-CM | POA: Diagnosis not present

## 2016-07-23 DIAGNOSIS — D472 Monoclonal gammopathy: Secondary | ICD-10-CM | POA: Diagnosis not present

## 2016-07-23 DIAGNOSIS — L89150 Pressure ulcer of sacral region, unstageable: Secondary | ICD-10-CM | POA: Diagnosis not present

## 2016-07-23 DIAGNOSIS — M353 Polymyalgia rheumatica: Secondary | ICD-10-CM | POA: Diagnosis not present

## 2016-07-23 DIAGNOSIS — E039 Hypothyroidism, unspecified: Secondary | ICD-10-CM | POA: Diagnosis not present

## 2016-07-23 DIAGNOSIS — I2510 Atherosclerotic heart disease of native coronary artery without angina pectoris: Secondary | ICD-10-CM | POA: Diagnosis not present

## 2016-07-23 DIAGNOSIS — J411 Mucopurulent chronic bronchitis: Secondary | ICD-10-CM | POA: Diagnosis not present

## 2016-07-25 DIAGNOSIS — J411 Mucopurulent chronic bronchitis: Secondary | ICD-10-CM | POA: Diagnosis not present

## 2016-07-25 DIAGNOSIS — G9340 Encephalopathy, unspecified: Secondary | ICD-10-CM | POA: Diagnosis not present

## 2016-07-25 DIAGNOSIS — A403 Sepsis due to Streptococcus pneumoniae: Secondary | ICD-10-CM | POA: Diagnosis not present

## 2016-07-25 DIAGNOSIS — E1122 Type 2 diabetes mellitus with diabetic chronic kidney disease: Secondary | ICD-10-CM | POA: Diagnosis not present

## 2016-07-25 DIAGNOSIS — L89150 Pressure ulcer of sacral region, unstageable: Secondary | ICD-10-CM | POA: Diagnosis not present

## 2016-07-25 DIAGNOSIS — I130 Hypertensive heart and chronic kidney disease with heart failure and stage 1 through stage 4 chronic kidney disease, or unspecified chronic kidney disease: Secondary | ICD-10-CM | POA: Diagnosis not present

## 2016-07-26 ENCOUNTER — Other Ambulatory Visit: Payer: Self-pay | Admitting: *Deleted

## 2016-07-26 ENCOUNTER — Encounter: Payer: Self-pay | Admitting: *Deleted

## 2016-07-26 DIAGNOSIS — M810 Age-related osteoporosis without current pathological fracture: Secondary | ICD-10-CM | POA: Diagnosis not present

## 2016-07-26 DIAGNOSIS — Z7952 Long term (current) use of systemic steroids: Secondary | ICD-10-CM | POA: Diagnosis not present

## 2016-07-26 DIAGNOSIS — M150 Primary generalized (osteo)arthritis: Secondary | ICD-10-CM | POA: Diagnosis not present

## 2016-07-26 DIAGNOSIS — M25559 Pain in unspecified hip: Secondary | ICD-10-CM | POA: Diagnosis not present

## 2016-07-26 DIAGNOSIS — M353 Polymyalgia rheumatica: Secondary | ICD-10-CM | POA: Diagnosis not present

## 2016-07-26 DIAGNOSIS — N183 Chronic kidney disease, stage 3 (moderate): Secondary | ICD-10-CM | POA: Diagnosis not present

## 2016-07-26 DIAGNOSIS — E663 Overweight: Secondary | ICD-10-CM | POA: Diagnosis not present

## 2016-07-26 DIAGNOSIS — M7989 Other specified soft tissue disorders: Secondary | ICD-10-CM | POA: Diagnosis not present

## 2016-07-26 DIAGNOSIS — D472 Monoclonal gammopathy: Secondary | ICD-10-CM | POA: Diagnosis not present

## 2016-07-26 DIAGNOSIS — Z6825 Body mass index (BMI) 25.0-25.9, adult: Secondary | ICD-10-CM | POA: Diagnosis not present

## 2016-07-26 NOTE — Patient Outreach (Signed)
[facility]) Care Management  07/26/2016  Rebecca [name] 08-21-1940 [id number]  EMMI-COPD referral for red on feeling worse overall, more mucus-color change. Used rescue inhaler 2 times in past 24 hours:  Admission 1/31-2/10/2016  Telephone call to patient; daughter/caregiver answered call. States she is caregiver and answers all of her mother's (patient) calls.  Caregiver states she answers all of EMMI-electronic calls for patient. HIPPA verification received from caregiver/daughter.  Caregiver voices that patient continues to be tired but is moving around more & has more energy since discharge on 02/05. States uses cane indoors & walker outside. States she is able to cough up more sputum and color is more white now than when in hospital. States consistency of sputum is loose. . Voices that she is using albuterol (rescue inhaler) maybe 2 times daily since hospital stay.  States prior to hospital admission she was only using 2 mantainence inhalers for COPD. States she has completed antibiotic dosage that was prescribed at hospital discharge. States she is managing patient's medications 7 she is taking medications as prescribed by MD.   Symptom review done with caregiver/daughter as it relates to pneumonia & COPD. Advised of action plan & she voices understanding reporting to MD office & calling 911 as needed.  States she takes patient to all MD appointments.  Voices that she has all of educational materials that she needs for COPD .  Voices that patient was admitted with COPD fair up and  pneumonia in 2 lobes.   States patient has seen primary care provider after hospital discharge 07/21/2016. States she also has home health services with [facility] and physical therapist.   Caregiver has no concerns at this time. EMMI-call completed.  Plan: Send EMMI-Pneumonia educational materials to patient. Send case to care management assistant to close out.  Sherrin Daisy,  RN BSN [place] Management Coordinator [facility] Care Management  [phone]

## 2016-07-27 DIAGNOSIS — A403 Sepsis due to Streptococcus pneumoniae: Secondary | ICD-10-CM | POA: Diagnosis not present

## 2016-07-27 DIAGNOSIS — J411 Mucopurulent chronic bronchitis: Secondary | ICD-10-CM | POA: Diagnosis not present

## 2016-07-27 DIAGNOSIS — I130 Hypertensive heart and chronic kidney disease with heart failure and stage 1 through stage 4 chronic kidney disease, or unspecified chronic kidney disease: Secondary | ICD-10-CM | POA: Diagnosis not present

## 2016-07-27 DIAGNOSIS — G9340 Encephalopathy, unspecified: Secondary | ICD-10-CM | POA: Diagnosis not present

## 2016-07-27 DIAGNOSIS — E1122 Type 2 diabetes mellitus with diabetic chronic kidney disease: Secondary | ICD-10-CM | POA: Diagnosis not present

## 2016-07-27 DIAGNOSIS — L89150 Pressure ulcer of sacral region, unstageable: Secondary | ICD-10-CM | POA: Diagnosis not present

## 2016-07-28 DIAGNOSIS — I130 Hypertensive heart and chronic kidney disease with heart failure and stage 1 through stage 4 chronic kidney disease, or unspecified chronic kidney disease: Secondary | ICD-10-CM | POA: Diagnosis not present

## 2016-07-28 DIAGNOSIS — J411 Mucopurulent chronic bronchitis: Secondary | ICD-10-CM | POA: Diagnosis not present

## 2016-07-28 DIAGNOSIS — A403 Sepsis due to Streptococcus pneumoniae: Secondary | ICD-10-CM | POA: Diagnosis not present

## 2016-07-28 DIAGNOSIS — L89150 Pressure ulcer of sacral region, unstageable: Secondary | ICD-10-CM | POA: Diagnosis not present

## 2016-07-28 DIAGNOSIS — E1122 Type 2 diabetes mellitus with diabetic chronic kidney disease: Secondary | ICD-10-CM | POA: Diagnosis not present

## 2016-07-28 DIAGNOSIS — G9340 Encephalopathy, unspecified: Secondary | ICD-10-CM | POA: Diagnosis not present

## 2016-07-29 DIAGNOSIS — G9340 Encephalopathy, unspecified: Secondary | ICD-10-CM | POA: Diagnosis not present

## 2016-07-29 DIAGNOSIS — L89150 Pressure ulcer of sacral region, unstageable: Secondary | ICD-10-CM | POA: Diagnosis not present

## 2016-07-29 DIAGNOSIS — I130 Hypertensive heart and chronic kidney disease with heart failure and stage 1 through stage 4 chronic kidney disease, or unspecified chronic kidney disease: Secondary | ICD-10-CM | POA: Diagnosis not present

## 2016-07-29 DIAGNOSIS — J411 Mucopurulent chronic bronchitis: Secondary | ICD-10-CM | POA: Diagnosis not present

## 2016-07-29 DIAGNOSIS — E1122 Type 2 diabetes mellitus with diabetic chronic kidney disease: Secondary | ICD-10-CM | POA: Diagnosis not present

## 2016-07-29 DIAGNOSIS — Z79899 Other long term (current) drug therapy: Secondary | ICD-10-CM | POA: Diagnosis not present

## 2016-07-29 DIAGNOSIS — A403 Sepsis due to Streptococcus pneumoniae: Secondary | ICD-10-CM | POA: Diagnosis not present

## 2016-07-30 DIAGNOSIS — J411 Mucopurulent chronic bronchitis: Secondary | ICD-10-CM | POA: Diagnosis not present

## 2016-07-30 DIAGNOSIS — G9340 Encephalopathy, unspecified: Secondary | ICD-10-CM | POA: Diagnosis not present

## 2016-07-30 DIAGNOSIS — I130 Hypertensive heart and chronic kidney disease with heart failure and stage 1 through stage 4 chronic kidney disease, or unspecified chronic kidney disease: Secondary | ICD-10-CM | POA: Diagnosis not present

## 2016-07-30 DIAGNOSIS — A403 Sepsis due to Streptococcus pneumoniae: Secondary | ICD-10-CM | POA: Diagnosis not present

## 2016-07-30 DIAGNOSIS — L89150 Pressure ulcer of sacral region, unstageable: Secondary | ICD-10-CM | POA: Diagnosis not present

## 2016-07-30 DIAGNOSIS — E1122 Type 2 diabetes mellitus with diabetic chronic kidney disease: Secondary | ICD-10-CM | POA: Diagnosis not present

## 2016-08-02 DIAGNOSIS — R358 Other polyuria: Secondary | ICD-10-CM | POA: Diagnosis not present

## 2016-08-03 ENCOUNTER — Ambulatory Visit (INDEPENDENT_AMBULATORY_CARE_PROVIDER_SITE_OTHER): Payer: Medicare Other | Admitting: Podiatry

## 2016-08-03 ENCOUNTER — Encounter: Payer: Self-pay | Admitting: Podiatry

## 2016-08-03 DIAGNOSIS — L600 Ingrowing nail: Secondary | ICD-10-CM

## 2016-08-03 DIAGNOSIS — A419 Sepsis, unspecified organism: Secondary | ICD-10-CM | POA: Diagnosis not present

## 2016-08-03 DIAGNOSIS — G9340 Encephalopathy, unspecified: Secondary | ICD-10-CM | POA: Diagnosis not present

## 2016-08-03 DIAGNOSIS — I130 Hypertensive heart and chronic kidney disease with heart failure and stage 1 through stage 4 chronic kidney disease, or unspecified chronic kidney disease: Secondary | ICD-10-CM | POA: Diagnosis not present

## 2016-08-03 DIAGNOSIS — J411 Mucopurulent chronic bronchitis: Secondary | ICD-10-CM | POA: Diagnosis not present

## 2016-08-03 DIAGNOSIS — L89150 Pressure ulcer of sacral region, unstageable: Secondary | ICD-10-CM | POA: Diagnosis not present

## 2016-08-03 DIAGNOSIS — E1122 Type 2 diabetes mellitus with diabetic chronic kidney disease: Secondary | ICD-10-CM | POA: Diagnosis not present

## 2016-08-03 DIAGNOSIS — E1149 Type 2 diabetes mellitus with other diabetic neurological complication: Secondary | ICD-10-CM

## 2016-08-03 DIAGNOSIS — B351 Tinea unguium: Secondary | ICD-10-CM

## 2016-08-03 DIAGNOSIS — M79676 Pain in unspecified toe(s): Secondary | ICD-10-CM

## 2016-08-03 DIAGNOSIS — A403 Sepsis due to Streptococcus pneumoniae: Secondary | ICD-10-CM | POA: Diagnosis not present

## 2016-08-04 DIAGNOSIS — I130 Hypertensive heart and chronic kidney disease with heart failure and stage 1 through stage 4 chronic kidney disease, or unspecified chronic kidney disease: Secondary | ICD-10-CM | POA: Diagnosis not present

## 2016-08-04 DIAGNOSIS — E1122 Type 2 diabetes mellitus with diabetic chronic kidney disease: Secondary | ICD-10-CM | POA: Diagnosis not present

## 2016-08-04 DIAGNOSIS — G9340 Encephalopathy, unspecified: Secondary | ICD-10-CM | POA: Diagnosis not present

## 2016-08-04 DIAGNOSIS — J411 Mucopurulent chronic bronchitis: Secondary | ICD-10-CM | POA: Diagnosis not present

## 2016-08-04 DIAGNOSIS — A403 Sepsis due to Streptococcus pneumoniae: Secondary | ICD-10-CM | POA: Diagnosis not present

## 2016-08-04 DIAGNOSIS — L89150 Pressure ulcer of sacral region, unstageable: Secondary | ICD-10-CM | POA: Diagnosis not present

## 2016-08-04 NOTE — Progress Notes (Signed)
She presents today with chief complaint of painful elongated toenails.  Objective: Tenderness along thick yellow dystrophic onychomycotic no signs of infection.  Assessment: Painful and segment onychomycosis.  Plan: Debridement of toenails 1 through 5 bilateral.

## 2016-08-05 DIAGNOSIS — I130 Hypertensive heart and chronic kidney disease with heart failure and stage 1 through stage 4 chronic kidney disease, or unspecified chronic kidney disease: Secondary | ICD-10-CM | POA: Diagnosis not present

## 2016-08-05 DIAGNOSIS — E1122 Type 2 diabetes mellitus with diabetic chronic kidney disease: Secondary | ICD-10-CM | POA: Diagnosis not present

## 2016-08-05 DIAGNOSIS — L89150 Pressure ulcer of sacral region, unstageable: Secondary | ICD-10-CM | POA: Diagnosis not present

## 2016-08-05 DIAGNOSIS — G9340 Encephalopathy, unspecified: Secondary | ICD-10-CM | POA: Diagnosis not present

## 2016-08-05 DIAGNOSIS — A403 Sepsis due to Streptococcus pneumoniae: Secondary | ICD-10-CM | POA: Diagnosis not present

## 2016-08-05 DIAGNOSIS — J411 Mucopurulent chronic bronchitis: Secondary | ICD-10-CM | POA: Diagnosis not present

## 2016-08-09 DIAGNOSIS — L89150 Pressure ulcer of sacral region, unstageable: Secondary | ICD-10-CM | POA: Diagnosis not present

## 2016-08-09 DIAGNOSIS — I130 Hypertensive heart and chronic kidney disease with heart failure and stage 1 through stage 4 chronic kidney disease, or unspecified chronic kidney disease: Secondary | ICD-10-CM | POA: Diagnosis not present

## 2016-08-09 DIAGNOSIS — M810 Age-related osteoporosis without current pathological fracture: Secondary | ICD-10-CM | POA: Diagnosis not present

## 2016-08-09 DIAGNOSIS — E1122 Type 2 diabetes mellitus with diabetic chronic kidney disease: Secondary | ICD-10-CM | POA: Diagnosis not present

## 2016-08-09 DIAGNOSIS — A403 Sepsis due to Streptococcus pneumoniae: Secondary | ICD-10-CM | POA: Diagnosis not present

## 2016-08-09 DIAGNOSIS — G9340 Encephalopathy, unspecified: Secondary | ICD-10-CM | POA: Diagnosis not present

## 2016-08-09 DIAGNOSIS — J411 Mucopurulent chronic bronchitis: Secondary | ICD-10-CM | POA: Diagnosis not present

## 2016-08-11 DIAGNOSIS — Z79899 Other long term (current) drug therapy: Secondary | ICD-10-CM | POA: Diagnosis not present

## 2016-08-12 DIAGNOSIS — H04123 Dry eye syndrome of bilateral lacrimal glands: Secondary | ICD-10-CM | POA: Diagnosis not present

## 2016-08-12 DIAGNOSIS — H26492 Other secondary cataract, left eye: Secondary | ICD-10-CM | POA: Diagnosis not present

## 2016-08-12 DIAGNOSIS — E119 Type 2 diabetes mellitus without complications: Secondary | ICD-10-CM | POA: Diagnosis not present

## 2016-08-12 DIAGNOSIS — H524 Presbyopia: Secondary | ICD-10-CM | POA: Diagnosis not present

## 2016-08-12 DIAGNOSIS — Z961 Presence of intraocular lens: Secondary | ICD-10-CM | POA: Diagnosis not present

## 2016-08-17 DIAGNOSIS — L89159 Pressure ulcer of sacral region, unspecified stage: Secondary | ICD-10-CM | POA: Diagnosis not present

## 2016-08-17 DIAGNOSIS — N184 Chronic kidney disease, stage 4 (severe): Secondary | ICD-10-CM | POA: Diagnosis not present

## 2016-08-17 DIAGNOSIS — L89150 Pressure ulcer of sacral region, unstageable: Secondary | ICD-10-CM | POA: Diagnosis not present

## 2016-08-17 DIAGNOSIS — J411 Mucopurulent chronic bronchitis: Secondary | ICD-10-CM | POA: Diagnosis not present

## 2016-08-17 DIAGNOSIS — D692 Other nonthrombocytopenic purpura: Secondary | ICD-10-CM | POA: Diagnosis not present

## 2016-08-17 DIAGNOSIS — I130 Hypertensive heart and chronic kidney disease with heart failure and stage 1 through stage 4 chronic kidney disease, or unspecified chronic kidney disease: Secondary | ICD-10-CM | POA: Diagnosis not present

## 2016-08-17 DIAGNOSIS — Z6825 Body mass index (BMI) 25.0-25.9, adult: Secondary | ICD-10-CM | POA: Diagnosis not present

## 2016-08-17 DIAGNOSIS — I10 Essential (primary) hypertension: Secondary | ICD-10-CM | POA: Diagnosis not present

## 2016-08-17 DIAGNOSIS — D72829 Elevated white blood cell count, unspecified: Secondary | ICD-10-CM | POA: Diagnosis not present

## 2016-08-17 DIAGNOSIS — R51 Headache: Secondary | ICD-10-CM | POA: Diagnosis not present

## 2016-08-17 DIAGNOSIS — N179 Acute kidney failure, unspecified: Secondary | ICD-10-CM | POA: Diagnosis not present

## 2016-08-17 DIAGNOSIS — G9340 Encephalopathy, unspecified: Secondary | ICD-10-CM | POA: Diagnosis not present

## 2016-08-17 DIAGNOSIS — E1122 Type 2 diabetes mellitus with diabetic chronic kidney disease: Secondary | ICD-10-CM | POA: Diagnosis not present

## 2016-08-17 DIAGNOSIS — A403 Sepsis due to Streptococcus pneumoniae: Secondary | ICD-10-CM | POA: Diagnosis not present

## 2016-08-17 DIAGNOSIS — M25551 Pain in right hip: Secondary | ICD-10-CM | POA: Diagnosis not present

## 2016-08-17 DIAGNOSIS — A419 Sepsis, unspecified organism: Secondary | ICD-10-CM | POA: Diagnosis not present

## 2016-08-20 DIAGNOSIS — L89150 Pressure ulcer of sacral region, unstageable: Secondary | ICD-10-CM | POA: Diagnosis not present

## 2016-08-20 DIAGNOSIS — J411 Mucopurulent chronic bronchitis: Secondary | ICD-10-CM | POA: Diagnosis not present

## 2016-08-20 DIAGNOSIS — A403 Sepsis due to Streptococcus pneumoniae: Secondary | ICD-10-CM | POA: Diagnosis not present

## 2016-08-20 DIAGNOSIS — I130 Hypertensive heart and chronic kidney disease with heart failure and stage 1 through stage 4 chronic kidney disease, or unspecified chronic kidney disease: Secondary | ICD-10-CM | POA: Diagnosis not present

## 2016-08-20 DIAGNOSIS — G9340 Encephalopathy, unspecified: Secondary | ICD-10-CM | POA: Diagnosis not present

## 2016-08-20 DIAGNOSIS — E1122 Type 2 diabetes mellitus with diabetic chronic kidney disease: Secondary | ICD-10-CM | POA: Diagnosis not present

## 2016-08-24 DIAGNOSIS — G9340 Encephalopathy, unspecified: Secondary | ICD-10-CM | POA: Diagnosis not present

## 2016-08-24 DIAGNOSIS — I130 Hypertensive heart and chronic kidney disease with heart failure and stage 1 through stage 4 chronic kidney disease, or unspecified chronic kidney disease: Secondary | ICD-10-CM | POA: Diagnosis not present

## 2016-08-24 DIAGNOSIS — J411 Mucopurulent chronic bronchitis: Secondary | ICD-10-CM | POA: Diagnosis not present

## 2016-08-24 DIAGNOSIS — L89150 Pressure ulcer of sacral region, unstageable: Secondary | ICD-10-CM | POA: Diagnosis not present

## 2016-08-24 DIAGNOSIS — E1122 Type 2 diabetes mellitus with diabetic chronic kidney disease: Secondary | ICD-10-CM | POA: Diagnosis not present

## 2016-08-24 DIAGNOSIS — A403 Sepsis due to Streptococcus pneumoniae: Secondary | ICD-10-CM | POA: Diagnosis not present

## 2016-08-28 ENCOUNTER — Telehealth: Payer: Self-pay | Admitting: Hematology and Oncology

## 2016-08-28 NOTE — Telephone Encounter (Signed)
Left message re 4/16 appointments

## 2016-08-31 ENCOUNTER — Ambulatory Visit: Payer: Medicare Other | Admitting: Podiatry

## 2016-08-31 NOTE — Telephone Encounter (Signed)
Schedule mailed.  °

## 2016-09-02 DIAGNOSIS — H26491 Other secondary cataract, right eye: Secondary | ICD-10-CM | POA: Diagnosis not present

## 2016-09-12 ENCOUNTER — Encounter (HOSPITAL_COMMUNITY): Payer: Self-pay

## 2016-09-12 ENCOUNTER — Inpatient Hospital Stay (HOSPITAL_COMMUNITY)
Admission: EM | Admit: 2016-09-12 | Discharge: 2016-09-16 | DRG: 871 | Disposition: A | Discharge status: Skilled Nursing Facility | Payer: Medicare Other | Attending: Internal Medicine | Admitting: Internal Medicine

## 2016-09-12 ENCOUNTER — Emergency Department (HOSPITAL_COMMUNITY): Payer: Medicare Other

## 2016-09-12 DIAGNOSIS — Z90710 Acquired absence of both cervix and uterus: Secondary | ICD-10-CM

## 2016-09-12 DIAGNOSIS — Z881 Allergy status to other antibiotic agents status: Secondary | ICD-10-CM

## 2016-09-12 DIAGNOSIS — E785 Hyperlipidemia, unspecified: Secondary | ICD-10-CM | POA: Diagnosis not present

## 2016-09-12 DIAGNOSIS — J1000 Influenza due to other identified influenza virus with unspecified type of pneumonia: Secondary | ICD-10-CM | POA: Diagnosis present

## 2016-09-12 DIAGNOSIS — G4733 Obstructive sleep apnea (adult) (pediatric): Secondary | ICD-10-CM | POA: Diagnosis present

## 2016-09-12 DIAGNOSIS — N390 Urinary tract infection, site not specified: Secondary | ICD-10-CM | POA: Diagnosis present

## 2016-09-12 DIAGNOSIS — Z7902 Long term (current) use of antithrombotics/antiplatelets: Secondary | ICD-10-CM

## 2016-09-12 DIAGNOSIS — E039 Hypothyroidism, unspecified: Secondary | ICD-10-CM | POA: Diagnosis not present

## 2016-09-12 DIAGNOSIS — Z8673 Personal history of transient ischemic attack (TIA), and cerebral infarction without residual deficits: Secondary | ICD-10-CM

## 2016-09-12 DIAGNOSIS — Y95 Nosocomial condition: Secondary | ICD-10-CM | POA: Diagnosis present

## 2016-09-12 DIAGNOSIS — D631 Anemia in chronic kidney disease: Secondary | ICD-10-CM | POA: Diagnosis not present

## 2016-09-12 DIAGNOSIS — E1122 Type 2 diabetes mellitus with diabetic chronic kidney disease: Secondary | ICD-10-CM | POA: Diagnosis present

## 2016-09-12 DIAGNOSIS — Z9181 History of falling: Secondary | ICD-10-CM | POA: Diagnosis not present

## 2016-09-12 DIAGNOSIS — Z79891 Long term (current) use of opiate analgesic: Secondary | ICD-10-CM

## 2016-09-12 DIAGNOSIS — Z833 Family history of diabetes mellitus: Secondary | ICD-10-CM

## 2016-09-12 DIAGNOSIS — R41841 Cognitive communication deficit: Secondary | ICD-10-CM | POA: Diagnosis not present

## 2016-09-12 DIAGNOSIS — M542 Cervicalgia: Secondary | ICD-10-CM | POA: Diagnosis not present

## 2016-09-12 DIAGNOSIS — R102 Pelvic and perineal pain: Secondary | ICD-10-CM | POA: Diagnosis not present

## 2016-09-12 DIAGNOSIS — A419 Sepsis, unspecified organism: Secondary | ICD-10-CM | POA: Diagnosis not present

## 2016-09-12 DIAGNOSIS — M353 Polymyalgia rheumatica: Secondary | ICD-10-CM | POA: Diagnosis present

## 2016-09-12 DIAGNOSIS — J101 Influenza due to other identified influenza virus with other respiratory manifestations: Secondary | ICD-10-CM | POA: Diagnosis present

## 2016-09-12 DIAGNOSIS — Z87891 Personal history of nicotine dependence: Secondary | ICD-10-CM | POA: Diagnosis not present

## 2016-09-12 DIAGNOSIS — J441 Chronic obstructive pulmonary disease with (acute) exacerbation: Secondary | ICD-10-CM | POA: Diagnosis not present

## 2016-09-12 DIAGNOSIS — J111 Influenza due to unidentified influenza virus with other respiratory manifestations: Secondary | ICD-10-CM | POA: Diagnosis not present

## 2016-09-12 DIAGNOSIS — Z91048 Other nonmedicinal substance allergy status: Secondary | ICD-10-CM

## 2016-09-12 DIAGNOSIS — I10 Essential (primary) hypertension: Secondary | ICD-10-CM | POA: Diagnosis not present

## 2016-09-12 DIAGNOSIS — F259 Schizoaffective disorder, unspecified: Secondary | ICD-10-CM | POA: Diagnosis present

## 2016-09-12 DIAGNOSIS — N179 Acute kidney failure, unspecified: Secondary | ICD-10-CM

## 2016-09-12 DIAGNOSIS — E872 Acidosis: Secondary | ICD-10-CM | POA: Diagnosis present

## 2016-09-12 DIAGNOSIS — I5032 Chronic diastolic (congestive) heart failure: Secondary | ICD-10-CM | POA: Diagnosis present

## 2016-09-12 DIAGNOSIS — J189 Pneumonia, unspecified organism: Secondary | ICD-10-CM | POA: Diagnosis present

## 2016-09-12 DIAGNOSIS — Z8261 Family history of arthritis: Secondary | ICD-10-CM

## 2016-09-12 DIAGNOSIS — R4182 Altered mental status, unspecified: Secondary | ICD-10-CM | POA: Diagnosis present

## 2016-09-12 DIAGNOSIS — K219 Gastro-esophageal reflux disease without esophagitis: Secondary | ICD-10-CM | POA: Diagnosis present

## 2016-09-12 DIAGNOSIS — Z6825 Body mass index (BMI) 25.0-25.9, adult: Secondary | ICD-10-CM

## 2016-09-12 DIAGNOSIS — E669 Obesity, unspecified: Secondary | ICD-10-CM | POA: Diagnosis present

## 2016-09-12 DIAGNOSIS — R05 Cough: Secondary | ICD-10-CM | POA: Diagnosis not present

## 2016-09-12 DIAGNOSIS — R0603 Acute respiratory distress: Secondary | ICD-10-CM | POA: Diagnosis present

## 2016-09-12 DIAGNOSIS — J9602 Acute respiratory failure with hypercapnia: Secondary | ICD-10-CM | POA: Diagnosis not present

## 2016-09-12 DIAGNOSIS — G9341 Metabolic encephalopathy: Secondary | ICD-10-CM | POA: Diagnosis not present

## 2016-09-12 DIAGNOSIS — I130 Hypertensive heart and chronic kidney disease with heart failure and stage 1 through stage 4 chronic kidney disease, or unspecified chronic kidney disease: Secondary | ICD-10-CM | POA: Diagnosis present

## 2016-09-12 DIAGNOSIS — R488 Other symbolic dysfunctions: Secondary | ICD-10-CM | POA: Diagnosis not present

## 2016-09-12 DIAGNOSIS — R069 Unspecified abnormalities of breathing: Secondary | ICD-10-CM

## 2016-09-12 DIAGNOSIS — Z955 Presence of coronary angioplasty implant and graft: Secondary | ICD-10-CM

## 2016-09-12 DIAGNOSIS — S0990XA Unspecified injury of head, initial encounter: Secondary | ICD-10-CM | POA: Diagnosis not present

## 2016-09-12 DIAGNOSIS — R0989 Other specified symptoms and signs involving the circulatory and respiratory systems: Secondary | ICD-10-CM | POA: Diagnosis not present

## 2016-09-12 DIAGNOSIS — Z7982 Long term (current) use of aspirin: Secondary | ICD-10-CM

## 2016-09-12 DIAGNOSIS — I2510 Atherosclerotic heart disease of native coronary artery without angina pectoris: Secondary | ICD-10-CM | POA: Diagnosis present

## 2016-09-12 DIAGNOSIS — W1830XA Fall on same level, unspecified, initial encounter: Secondary | ICD-10-CM | POA: Diagnosis present

## 2016-09-12 DIAGNOSIS — Z5189 Encounter for other specified aftercare: Secondary | ICD-10-CM | POA: Diagnosis not present

## 2016-09-12 DIAGNOSIS — J9601 Acute respiratory failure with hypoxia: Secondary | ICD-10-CM | POA: Diagnosis not present

## 2016-09-12 DIAGNOSIS — J440 Chronic obstructive pulmonary disease with acute lower respiratory infection: Secondary | ICD-10-CM | POA: Diagnosis present

## 2016-09-12 DIAGNOSIS — D472 Monoclonal gammopathy: Secondary | ICD-10-CM | POA: Diagnosis present

## 2016-09-12 DIAGNOSIS — B9620 Unspecified Escherichia coli [E. coli] as the cause of diseases classified elsewhere: Secondary | ICD-10-CM | POA: Diagnosis present

## 2016-09-12 DIAGNOSIS — N189 Chronic kidney disease, unspecified: Secondary | ICD-10-CM

## 2016-09-12 DIAGNOSIS — Z79899 Other long term (current) drug therapy: Secondary | ICD-10-CM

## 2016-09-12 DIAGNOSIS — B999 Unspecified infectious disease: Secondary | ICD-10-CM | POA: Diagnosis not present

## 2016-09-12 DIAGNOSIS — J449 Chronic obstructive pulmonary disease, unspecified: Secondary | ICD-10-CM | POA: Diagnosis not present

## 2016-09-12 DIAGNOSIS — Z8249 Family history of ischemic heart disease and other diseases of the circulatory system: Secondary | ICD-10-CM

## 2016-09-12 DIAGNOSIS — M6281 Muscle weakness (generalized): Secondary | ICD-10-CM | POA: Diagnosis not present

## 2016-09-12 DIAGNOSIS — R918 Other nonspecific abnormal finding of lung field: Secondary | ICD-10-CM | POA: Diagnosis not present

## 2016-09-12 DIAGNOSIS — N184 Chronic kidney disease, stage 4 (severe): Secondary | ICD-10-CM | POA: Diagnosis not present

## 2016-09-12 DIAGNOSIS — R509 Fever, unspecified: Secondary | ICD-10-CM | POA: Diagnosis not present

## 2016-09-12 DIAGNOSIS — Z9049 Acquired absence of other specified parts of digestive tract: Secondary | ICD-10-CM

## 2016-09-12 LAB — CBC WITH DIFFERENTIAL/PLATELET
Basophils Absolute: 0 10*3/uL (ref 0.0–0.1)
Basophils Relative: 0 %
EOS PCT: 0 %
Eosinophils Absolute: 0 10*3/uL (ref 0.0–0.7)
HEMATOCRIT: 34.4 % — AB (ref 36.0–46.0)
Hemoglobin: 11.1 g/dL — ABNORMAL LOW (ref 12.0–15.0)
LYMPHS ABS: 0.7 10*3/uL (ref 0.7–4.0)
LYMPHS PCT: 5 %
MCH: 30.3 pg (ref 26.0–34.0)
MCHC: 32.3 g/dL (ref 30.0–36.0)
MCV: 94 fL (ref 78.0–100.0)
MONO ABS: 0.9 10*3/uL (ref 0.1–1.0)
Monocytes Relative: 6 %
NEUTROS ABS: 12 10*3/uL — AB (ref 1.7–7.7)
Neutrophils Relative %: 89 %
PLATELETS: 168 10*3/uL (ref 150–400)
RBC: 3.66 MIL/uL — AB (ref 3.87–5.11)
RDW: 13.8 % (ref 11.5–15.5)
WBC: 13.6 10*3/uL — AB (ref 4.0–10.5)

## 2016-09-12 LAB — COMPREHENSIVE METABOLIC PANEL
ALBUMIN: 3.6 g/dL (ref 3.5–5.0)
ALT: 14 U/L (ref 14–54)
ANION GAP: 8 (ref 5–15)
AST: 25 U/L (ref 15–41)
Alkaline Phosphatase: 38 U/L (ref 38–126)
BUN: 38 mg/dL — AB (ref 6–20)
CHLORIDE: 104 mmol/L (ref 101–111)
CO2: 26 mmol/L (ref 22–32)
Calcium: 9.8 mg/dL (ref 8.9–10.3)
Creatinine, Ser: 2.01 mg/dL — ABNORMAL HIGH (ref 0.44–1.00)
GFR calc Af Amer: 26 mL/min — ABNORMAL LOW (ref >60)
GFR calc non Af Amer: 23 mL/min — ABNORMAL LOW (ref >60)
GLUCOSE: 196 mg/dL — AB (ref 65–99)
POTASSIUM: 4.7 mmol/L (ref 3.5–5.1)
Sodium: 138 mmol/L (ref 135–145)
Total Bilirubin: 0.7 mg/dL (ref 0.3–1.2)
Total Protein: 6.8 g/dL (ref 6.5–8.1)

## 2016-09-12 LAB — URINALYSIS, ROUTINE W REFLEX MICROSCOPIC
BILIRUBIN URINE: NEGATIVE
Glucose, UA: NEGATIVE mg/dL
Hgb urine dipstick: NEGATIVE
KETONES UR: NEGATIVE mg/dL
LEUKOCYTES UA: NEGATIVE
Nitrite: NEGATIVE
PH: 5 (ref 5.0–8.0)
PROTEIN: 30 mg/dL — AB
SQUAMOUS EPITHELIAL / LPF: NONE SEEN
Specific Gravity, Urine: 1.014 (ref 1.005–1.030)

## 2016-09-12 LAB — BLOOD GAS, VENOUS
Acid-base deficit: 2 mmol/L (ref 0.0–2.0)
Bicarbonate: 24.2 mmol/L (ref 20.0–28.0)
Drawn by: 235321
FIO2: 21
O2 Saturation: 31.4 %
Patient temperature: 98.6
pCO2, Ven: 51.3 mmHg (ref 44.0–60.0)
pH, Ven: 7.295 (ref 7.250–7.430)

## 2016-09-12 LAB — CK: CK TOTAL: 151 U/L (ref 38–234)

## 2016-09-12 LAB — INFLUENZA PANEL BY PCR (TYPE A & B)
INFLAPCR: NEGATIVE
INFLBPCR: POSITIVE — AB

## 2016-09-12 LAB — GLUCOSE, CAPILLARY: GLUCOSE-CAPILLARY: 113 mg/dL — AB (ref 65–99)

## 2016-09-12 LAB — I-STAT CG4 LACTIC ACID, ED: LACTIC ACID, VENOUS: 0.98 mmol/L (ref 0.5–1.9)

## 2016-09-12 MED ORDER — HEPARIN SODIUM (PORCINE) 5000 UNIT/ML IJ SOLN
5000.0000 [IU] | Freq: Three times a day (TID) | INTRAMUSCULAR | Status: DC
  Administered 2016-09-12 – 2016-09-16 (×12): 5000 [IU] via SUBCUTANEOUS
  Filled 2016-09-12 (×12): qty 1

## 2016-09-12 MED ORDER — DEXTROSE 5 % IV SOLN: 1.0000 g | INTRAVENOUS | Status: DC

## 2016-09-12 MED ORDER — PRAVASTATIN SODIUM 20 MG PO TABS
20.0000 mg | ORAL_TABLET | Freq: Every day | ORAL | Status: DC
  Administered 2016-09-12 – 2016-09-15 (×4): 20 mg via ORAL
  Filled 2016-09-12 (×4): qty 1

## 2016-09-12 MED ORDER — PANTOPRAZOLE SODIUM 40 MG PO TBEC
40.0000 mg | DELAYED_RELEASE_TABLET | Freq: Every day | ORAL | Status: DC
  Administered 2016-09-13 – 2016-09-16 (×4): 40 mg via ORAL
  Filled 2016-09-12 (×4): qty 1

## 2016-09-12 MED ORDER — VANCOMYCIN HCL IN DEXTROSE 750-5 MG/150ML-% IV SOLN
750.0000 mg | INTRAVENOUS | Status: DC
  Administered 2016-09-13 – 2016-09-14 (×2): 750 mg via INTRAVENOUS
  Filled 2016-09-12 (×3): qty 150

## 2016-09-12 MED ORDER — DILTIAZEM HCL ER COATED BEADS 180 MG PO CP24
180.0000 mg | ORAL_CAPSULE | Freq: Every day | ORAL | Status: DC
  Administered 2016-09-13 – 2016-09-16 (×4): 180 mg via ORAL
  Filled 2016-09-12 (×8): qty 1

## 2016-09-12 MED ORDER — ISOSORBIDE MONONITRATE ER 60 MG PO TB24
60.0000 mg | ORAL_TABLET | Freq: Every day | ORAL | Status: DC
  Administered 2016-09-12 – 2016-09-16 (×5): 60 mg via ORAL
  Filled 2016-09-12: qty 2
  Filled 2016-09-12 (×4): qty 1

## 2016-09-12 MED ORDER — TRAMADOL HCL 50 MG PO TABS
100.0000 mg | ORAL_TABLET | Freq: Two times a day (BID) | ORAL | Status: DC | PRN
  Administered 2016-09-14: 100 mg via ORAL
  Filled 2016-09-12: qty 2

## 2016-09-12 MED ORDER — MOMETASONE FURO-FORMOTEROL FUM 200-5 MCG/ACT IN AERO
2.0000 | INHALATION_SPRAY | Freq: Two times a day (BID) | RESPIRATORY_TRACT | Status: DC
  Administered 2016-09-13 – 2016-09-16 (×6): 2 via RESPIRATORY_TRACT
  Filled 2016-09-12: qty 8.8

## 2016-09-12 MED ORDER — SODIUM CHLORIDE 0.9 % IV BOLUS (SEPSIS)
1000.0000 mL | Freq: Once | INTRAVENOUS | Status: AC
  Administered 2016-09-12: 1000 mL via INTRAVENOUS

## 2016-09-12 MED ORDER — POLYETHYLENE GLYCOL 3350 17 GM/SCOOP PO POWD: 17.0000 g | Freq: Every day | ORAL | Status: DC

## 2016-09-12 MED ORDER — POLYETHYLENE GLYCOL 3350 17 G PO PACK
17.0000 g | PACK | Freq: Every day | ORAL | Status: DC
  Administered 2016-09-12 – 2016-09-16 (×5): 17 g via ORAL
  Filled 2016-09-12 (×5): qty 1

## 2016-09-12 MED ORDER — DOCUSATE SODIUM 100 MG PO CAPS
200.0000 mg | ORAL_CAPSULE | Freq: Every day | ORAL | Status: DC
  Administered 2016-09-12 – 2016-09-15 (×4): 200 mg via ORAL
  Filled 2016-09-12 (×4): qty 2

## 2016-09-12 MED ORDER — CLOPIDOGREL BISULFATE 75 MG PO TABS
75.0000 mg | ORAL_TABLET | Freq: Every day | ORAL | Status: DC
  Administered 2016-09-12 – 2016-09-15 (×4): 75 mg via ORAL
  Filled 2016-09-12 (×4): qty 1

## 2016-09-12 MED ORDER — OSELTAMIVIR PHOSPHATE 30 MG PO CAPS
30.0000 mg | ORAL_CAPSULE | ORAL | Status: DC
  Administered 2016-09-12 – 2016-09-15 (×4): 30 mg via ORAL
  Filled 2016-09-12 (×5): qty 1

## 2016-09-12 MED ORDER — LEVOTHYROXINE SODIUM 75 MCG PO TABS
75.0000 ug | ORAL_TABLET | Freq: Every day | ORAL | Status: DC
  Administered 2016-09-13 – 2016-09-16 (×4): 75 ug via ORAL
  Filled 2016-09-12 (×4): qty 1

## 2016-09-12 MED ORDER — TRAMADOL HCL ER 300 MG PO TB24: 300.0000 mg | ORAL_TABLET | Freq: Every morning | ORAL | Status: DC

## 2016-09-12 MED ORDER — METOPROLOL SUCCINATE ER 100 MG PO TB24
100.0000 mg | ORAL_TABLET | Freq: Every day | ORAL | Status: DC
  Administered 2016-09-13 – 2016-09-16 (×4): 100 mg via ORAL
  Filled 2016-09-12 (×3): qty 4
  Filled 2016-09-12: qty 1

## 2016-09-12 MED ORDER — DEXTROSE 5 % IV SOLN: 1.0000 g | Freq: Three times a day (TID) | INTRAVENOUS | Status: DC

## 2016-09-12 MED ORDER — HEPARIN SODIUM (PORCINE) 5000 UNIT/ML IJ SOLN: 5000.0000 [IU] | Freq: Three times a day (TID) | INTRAMUSCULAR | Status: DC

## 2016-09-12 MED ORDER — LORATADINE 10 MG PO TABS
10.0000 mg | ORAL_TABLET | Freq: Every day | ORAL | Status: DC
  Administered 2016-09-12 – 2016-09-16 (×5): 10 mg via ORAL
  Filled 2016-09-12 (×5): qty 1

## 2016-09-12 MED ORDER — ASPIRIN EC 81 MG PO TBEC
81.0000 mg | DELAYED_RELEASE_TABLET | Freq: Every day | ORAL | Status: DC
  Administered 2016-09-12 – 2016-09-16 (×5): 81 mg via ORAL
  Filled 2016-09-12 (×5): qty 1

## 2016-09-12 MED ORDER — TIOTROPIUM BROMIDE MONOHYDRATE 18 MCG IN CAPS
18.0000 ug | ORAL_CAPSULE | Freq: Every day | RESPIRATORY_TRACT | Status: DC
  Administered 2016-09-14 – 2016-09-16 (×2): 18 ug via RESPIRATORY_TRACT
  Filled 2016-09-12 (×2): qty 5

## 2016-09-12 MED ORDER — TIOTROPIUM BROMIDE MONOHYDRATE 2.5 MCG/ACT IN AERS: 2.0000 | INHALATION_SPRAY | Freq: Every morning | RESPIRATORY_TRACT | Status: DC

## 2016-09-12 MED ORDER — OMEGA-3-ACID ETHYL ESTERS 1 G PO CAPS: 1.0000 g | ORAL_CAPSULE | Freq: Two times a day (BID) | ORAL | Status: DC

## 2016-09-12 MED ORDER — BISACODYL 10 MG RE SUPP: 10.0000 mg | Freq: Every day | RECTAL | Status: DC | PRN

## 2016-09-12 MED ORDER — MAGNESIUM CITRATE PO SOLN: 1.0000 | Freq: Once | ORAL | Status: DC | PRN

## 2016-09-12 MED ORDER — CLOZAPINE 100 MG PO TABS
300.0000 mg | ORAL_TABLET | Freq: Every day | ORAL | Status: DC
  Administered 2016-09-12 – 2016-09-15 (×4): 300 mg via ORAL
  Filled 2016-09-12 (×6): qty 3

## 2016-09-12 MED ORDER — ALBUTEROL SULFATE (2.5 MG/3ML) 0.083% IN NEBU
2.5000 mg | INHALATION_SOLUTION | Freq: Three times a day (TID) | RESPIRATORY_TRACT | Status: DC
  Administered 2016-09-13 – 2016-09-16 (×9): 2.5 mg via RESPIRATORY_TRACT
  Filled 2016-09-12 (×10): qty 3

## 2016-09-12 MED ORDER — ACETAMINOPHEN 325 MG PO TABS
650.0000 mg | ORAL_TABLET | Freq: Four times a day (QID) | ORAL | Status: DC | PRN
  Administered 2016-09-15: 650 mg via ORAL
  Filled 2016-09-12: qty 2

## 2016-09-12 MED ORDER — VITAMIN D (ERGOCALCIFEROL) 1.25 MG (50000 UNIT) PO CAPS
50000.0000 [IU] | ORAL_CAPSULE | ORAL | Status: DC
  Administered 2016-09-12: 50000 [IU] via ORAL
  Filled 2016-09-12: qty 1

## 2016-09-12 MED ORDER — TEMAZEPAM 15 MG PO CAPS
15.0000 mg | ORAL_CAPSULE | Freq: Every evening | ORAL | Status: DC | PRN
Start: 2016-09-12 — End: 2016-09-15

## 2016-09-12 MED ORDER — ACETAMINOPHEN 325 MG PO TABS
650.0000 mg | ORAL_TABLET | Freq: Once | ORAL | Status: DC
  Filled 2016-09-12: qty 2

## 2016-09-12 MED ORDER — ADULT MULTIVITAMIN W/MINERALS CH
1.0000 | ORAL_TABLET | Freq: Every day | ORAL | Status: DC
  Administered 2016-09-12 – 2016-09-15 (×4): 1 via ORAL
  Filled 2016-09-12 (×4): qty 1

## 2016-09-12 MED ORDER — ONE-A-DAY MENS 50+ ADVANTAGE PO TABS: 1.0000 | ORAL_TABLET | Freq: Every day | ORAL | Status: DC

## 2016-09-12 MED ORDER — ALBUTEROL SULFATE HFA 108 (90 BASE) MCG/ACT IN AERS: 2.0000 | INHALATION_SPRAY | Freq: Four times a day (QID) | RESPIRATORY_TRACT | Status: DC | PRN

## 2016-09-12 MED ORDER — DEXTROSE 5 % IV SOLN
2.0000 g | Freq: Once | INTRAVENOUS | Status: AC
  Administered 2016-09-12: 2 g via INTRAVENOUS
  Filled 2016-09-12: qty 2

## 2016-09-12 MED ORDER — VANCOMYCIN HCL IN DEXTROSE 1-5 GM/200ML-% IV SOLN
1000.0000 mg | Freq: Once | INTRAVENOUS | Status: AC
  Administered 2016-09-12: 1000 mg via INTRAVENOUS
  Filled 2016-09-12: qty 200

## 2016-09-12 MED ORDER — ALBUTEROL SULFATE (2.5 MG/3ML) 0.083% IN NEBU: 2.5000 mg | INHALATION_SOLUTION | Freq: Four times a day (QID) | RESPIRATORY_TRACT | Status: DC | PRN

## 2016-09-12 MED ORDER — SENNOSIDES-DOCUSATE SODIUM 8.6-50 MG PO TABS: 1.0000 | ORAL_TABLET | Freq: Every evening | ORAL | Status: DC | PRN

## 2016-09-12 MED ORDER — NITROGLYCERIN 0.4 MG SL SUBL: 0.4000 mg | SUBLINGUAL_TABLET | SUBLINGUAL | Status: DC | PRN

## 2016-09-12 MED ORDER — PREDNISONE 5 MG PO TABS
10.0000 mg | ORAL_TABLET | Freq: Every day | ORAL | Status: DC
  Administered 2016-09-13 – 2016-09-16 (×4): 10 mg via ORAL
  Filled 2016-09-12 (×3): qty 1
  Filled 2016-09-12: qty 2

## 2016-09-12 MED ORDER — TRAMADOL HCL 50 MG PO TABS
100.0000 mg | ORAL_TABLET | Freq: Three times a day (TID) | ORAL | Status: DC
  Administered 2016-09-13 – 2016-09-14 (×5): 100 mg via ORAL
  Filled 2016-09-12 (×5): qty 2

## 2016-09-12 MED ORDER — DEXTROSE 5 % IV SOLN
1.0000 g | INTRAVENOUS | Status: DC
  Administered 2016-09-13 – 2016-09-15 (×3): 1 g via INTRAVENOUS
  Filled 2016-09-12 (×4): qty 1

## 2016-09-12 MED ORDER — ACETAMINOPHEN 650 MG RE SUPP
650.0000 mg | Freq: Four times a day (QID) | RECTAL | Status: DC | PRN
Start: 2016-09-12 — End: 2016-09-16

## 2016-09-12 MED ORDER — SODIUM CHLORIDE 0.9 % IV SOLN: INTRAVENOUS | Status: DC

## 2016-09-12 MED ORDER — TRAZODONE HCL 50 MG PO TABS
25.0000 mg | ORAL_TABLET | Freq: Every evening | ORAL | Status: DC | PRN
Start: 2016-09-12 — End: 2016-09-16

## 2016-09-12 MED ORDER — SODIUM CHLORIDE 0.9 % IV BOLUS (SEPSIS)
1000.0000 mL | Freq: Once | INTRAVENOUS | Status: AC
Start: 2016-09-12 — End: 2016-09-12
  Administered 2016-09-12: 1000 mL via INTRAVENOUS

## 2016-09-12 NOTE — Progress Notes (Signed)
At 1725 Patient  was received from ED seeming very lethargic with agonal type breathing which was regular with respirations at 14, VS 143/63, 66, 97.5 AX, 99% 3L at 1725. At 1830 VS 117/63, 62, 16, 100% 3L 97.0. Rapid response RN Ruby called to assess patient, was noted that VS were stable, RN recommended obtaining  ABG. MD Ava Swayze  Paged. Will continue to monitor.

## 2016-09-12 NOTE — ED Notes (Signed)
Bed: ZC02 Expected date:  Expected time:  Means of arrival:  Comments: Hold 24, weakness, fall, sepsis ?

## 2016-09-12 NOTE — Progress Notes (Signed)
PHARMACY NOTE:  ANTIMICROBIAL RENAL DOSAGE ADJUSTMENT  Current antimicrobial regimen includes a mismatch between antimicrobial dosage and estimated renal function.  As per policy approved by the Pharmacy & Therapeutics and Medical Executive Committees, the antimicrobial dosage will be adjusted accordingly.  Current antimicrobial dosage:  Tamiflu 75mg  PO BID  Indication: influenza B (+)  Renal Function:  Estimated Creatinine Clearance: 21.2 mL/min (A) (by C-G formula based on SCr of 2.01 mg/dL (H)). []      On intermittent HD, scheduled: []      On CRRT    Antimicrobial dosage has been changed to:  30mg  PO q24h  Additional comments:   Thank you for allowing pharmacy to be a part of this patient's care.  Peggyann Juba, PharmD, BCPS 09/12/2016 8:24 PM

## 2016-09-12 NOTE — ED Triage Notes (Signed)
Per EMS, pt is coming from home after experiencing a fall at home last night. Pt lives with her daughter and daughter stated pt did not hit her head when fall occurred. Daughter reports that pt had a fever and a productive cough last night and had increased weakness after the fall occurred. Pt usually ambulates independently but now requires assistance to get up out of the bed. Pt AO x4.

## 2016-09-12 NOTE — Progress Notes (Signed)
Arterial attempts unsuccessful for blood gas analysis. Venous sample obtained and analyzed. Order changed to venous per RT protocol. Result given to Darrin Nipper, RN Rapid Response.

## 2016-09-12 NOTE — Progress Notes (Signed)
Patient had labor  breathing when transfer from ED per shift report.  Patient was very drowsy hard to aroused and was not in her normal state per daughter at the bedside.  ABG ordered and was concerning with retaining CO2 and acidotic. Paged NP multiple times and was instructed that Swayze will come assess patient. After multiple pages to  [facility] outside the responding time therefore Per RT, [facility] and RRRN as a team  transferred patient to [place] for possible BIPAP therapy. Report gave to [facility].

## 2016-09-12 NOTE — Progress Notes (Signed)
No response from page of [phone] to Karie Kirks, MD. Imogene Burn night coverage paged concerning rapid response RN recommendation of ABG. Will continue to monitor.

## 2016-09-12 NOTE — Progress Notes (Signed)
Pharmacy Antibiotic Note  Rebecca [name] is a 76 y.o. female with PMH CAD, CKD 4, COPD, DM, CHF, MGUS, CVA, polymyalgia rheumatica; presenting 09/12/2016 after fall last night; reports productive cough and fever with weakness. Admitting for sepsis d/t HCAP.  Pharmacy has been consulted for vancomycin and cefepime dosing.  Plan:  Vancomycin 1000 mg IV now, then 750 mg IV q24 hr; goal trough 15-20 mcg/mL  Measure vancomycin trough levels at steady state as indicated  Cefepime 1 gmg IV q24 hr Follow clinical course, renal function, culture results as available Follow for de-escalation of antibiotics and LOT   Height: 5\' 3"  (160 cm) Weight: 142 lb (64.4 kg) IBW/kg (Calculated) : 52.4  Temp (24hrs), Avg:100.6 F (38.1 C), Min:100.6 F (38.1 C), Max:100.6 F (38.1 C)   Recent Labs Lab 09/12/16 1307 09/12/16 1313  WBC 13.6*  --   LATICACIDVEN  --  0.98    CrCl cannot be calculated (Patient's most recent lab result is older than the maximum 21 days allowed.).    Allergies  Allergen Reactions  . Levaquin [Levofloxacin] Other (See Comments)    Ruptured tendon  . Tape Other (See Comments)    PATIENT'S SKIN IS VERY, VERY THIN AND WILL TEAR AND BRUISE EASILY; PLEASE USE AN ALTERNATIVE (SOMETHING OTHER THAN TAPE)    Antimicrobials this admission: vancomycin 4/1 >>  cefepime 4/1 >>   Dose adjustments this admission: ---  Microbiology results: 4/1 BCx: sent 4/1 UCx: sent    Thank you for allowing pharmacy to be a part of this patient's care.  Reuel Boom, PharmD, BCPS Pager: [phone] 09/12/2016, 2:04 PM

## 2016-09-12 NOTE — Progress Notes (Signed)
Called to assess patient for lethargy. Initially, patient was not responsive to voice, was arousable  to physical stimuli but easily fell back asleep. She was oriented x 3, pupils 3 + and brisk, lungs sounds rhonchus throughout. BP 146/68 , 98 % on 2L O2, HR 60, CBG 113. [facility] on call paged, order for ABG placed. PH 7.2 and CO2 51.3, admitting MD to see patient and place new orders.

## 2016-09-12 NOTE — ED Notes (Signed)
1st set of cultures drawn at 1307.

## 2016-09-12 NOTE — H&P (Addendum)
Rebecca [name] is an 76 y.o. female.   Chief Complaint: Cough, fever, weakness HPI: The patient is a 76 yr old woman who lives at home with her daughter. She was discharged from this facility on 07/19/2016 after a stay for pneumonia and sepsis. According to the patients daughter she had been doing well last night when she was sounding a little congested. This morning the patient fell to the floor a short distance from her low bed and was not able to get up. She was confused according to the patient's daughter. She was very weak and required the assistance of two people to get her onto her feet. She had a low grade temperature. EMS was called. They found her to be hypoxic upon their arrival. The patient's sensorium improved with the placemnt of oxygen. The patient has a past medical history of PMR for which she takes 10 mg of prednisone everyday. She also has COPD, HTN, CAD, GERD, Schizoaffective disorder, Stage 4 CKD, and dyslipidemia.   Past Medical History:  Diagnosis Date  . CAD (coronary artery disease) 2006   Taxus stent of mid RCA. negative myoview 2014 and normal LV function  . CKD (chronic kidney disease) stage 4, GFR 15-29 ml/min (HCC)   . Complication of anesthesia   . COPD (chronic obstructive pulmonary disease) ([place])   . Diabetes mellitus   . Diastolic heart failure, NYHA class 1 ([place])   . Dysuria 01/06/2015  . Frozen shoulder    right  . Headache   . Hyperkalemia 01/01/2016  . 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 Obesity   . Pneumonia 06/2016  . Polymyalgia rheumatica ([place])   . PONV (postoperative nausea and vomiting)   . Renal insufficiency   . Shortness of breath   . Stroke [facility])     Past Surgical History:  Procedure Laterality Date  . ABDOMINAL HYSTERECTOMY    . CARDIAC CATHETERIZATION N/A 08/13/2015   Procedure: Left Heart Cath and Coronary Angiography;  Surgeon: Jettie Booze, MD;  Location: [place] CV LAB;  Service: Cardiovascular;   Laterality: N/A;  . CATARACT EXTRACTION    . CHOLECYSTECTOMY    . CORONARY ANGIOPLASTY WITH STENT PLACEMENT  2006   Taxus DES to RCA, 50 % residual  . PARTIAL HYSTERECTOMY  1995    Family History  Problem Relation Age of Onset  . Diabetes Mother   . Heart disease Mother   . Heart disease Father   . Arthritis Father   . Healthy Sister   . Heart disease Brother   . Cancer Brother     colon ca  . Osteoporosis Sister   . Heart disease Brother   . Diabetes Brother   . Heart disease Brother   . Diabetes Brother   . Diabetes Brother   . Healthy Brother   . Healthy Brother   . Healthy Brother   . Cancer Maternal Uncle     blood condition  . Cancer Daughter     breast ca  . Cancer Cousin     [employer]   Social History:  reports that she quit smoking about 12 years ago. She has a 35.00 pack-year smoking history. She has never used smokeless tobacco. She reports that she does not drink alcohol or use drugs.  (Not in a hospital admission)  Allergies:  Allergies  Allergen Reactions  . Levaquin [Levofloxacin] Other (See Comments)    Ruptured tendon  . Tape Other (See Comments)    PATIENT'S SKIN  IS VERY, VERY THIN AND WILL TEAR AND BRUISE EASILY; PLEASE USE AN ALTERNATIVE (SOMETHING OTHER THAN TAPE)    Review of Systems - History obtained from Patient's daughter General ROS: positive for  - fatigue and malaise negative for - chills, fatigue, night sweats or sleep disturbance Psychological ROS: negative for - anxiety, behavioral disorder, depression, disorientation or hostility ENT ROS: positive for - nasal congestion Hematological and Lymphatic ROS: negative for - bleeding problems, blood clots, blood transfusions, bruising or jaundice Respiratory ROS: positive for - cough, shortness of breath and tachypnea negative for - orthopnea, stridor or wheezing Cardiovascular ROS: no chest pain or dyspnea on exertion Gastrointestinal ROS: no abdominal pain, change in bowel habits, or black  or bloody stools Musculoskeletal ROS: negative for - gait disturbance, joint pain, joint stiffness, joint swelling or muscle pain Neurological ROS: no TIA or stroke symptoms Dermatological ROS: negative for dry skin, lumps, mole changes and rash    General appearance: alert, cooperative and slowed mentation Head: Normocephalic, without obvious abnormality, atraumatic Eyes: conjunctivae/corneas clear. PERRL, EOM's intact. Fundi benign. Throat: lips, mucosa, and tongue normal; teeth and gums normal Neck: no adenopathy, no carotid bruit, no JVD, supple, symmetrical, trachea midline and thyroid not enlarged, symmetric, no tenderness/mass/nodules Resp: bronchophony bilaterally, no wheezes, positive for coarse rales, and rhonchi. Positive for increased work of breathing. Chest wall: no tenderness Cardio: regular rate and rhythm, S1, S2 normal, no murmur, click, rub or gallop GI: soft, non-tender; bowel sounds normal; no masses,  no organomegaly Extremities: extremities normal, atraumatic, no cyanosis or edema Pulses: 2+ and symmetric Skin: Skin color, texture, turgor normal. No rashes or lesions Lymph nodes: Cervical, supraclavicular, and axillary nodes normal. Neurologic: Alert and oriented X 3, normal strength and tone. Normal symmetric reflexes. Normal coordination and gait  Results for orders placed or performed during the hospital encounter of 09/12/16 (from the past 48 hour(s))  Urinalysis, Routine w reflex microscopic     Status: Abnormal   Collection Time: 09/12/16 12:47 PM  Result Value Ref Range   Color, Urine YELLOW YELLOW   APPearance HAZY (A) CLEAR   Specific Gravity, Urine 1.014 1.005 - 1.030   pH 5.0 5.0 - 8.0   Glucose, UA NEGATIVE NEGATIVE mg/dL   Hgb urine dipstick NEGATIVE NEGATIVE   Bilirubin Urine NEGATIVE NEGATIVE   Ketones, ur NEGATIVE NEGATIVE mg/dL   Protein, ur 30 (A) NEGATIVE mg/dL   Nitrite NEGATIVE NEGATIVE   Leukocytes, UA NEGATIVE NEGATIVE   RBC / HPF 0-5  0 - 5 RBC/hpf   WBC, UA 0-5 0 - 5 WBC/hpf   Bacteria, UA MANY (A) NONE SEEN   Squamous Epithelial / LPF NONE SEEN NONE SEEN   Mucous PRESENT   Influenza panel by PCR (type A & B)     Status: Abnormal   Collection Time: 09/12/16 12:51 PM  Result Value Ref Range   Influenza A By PCR NEGATIVE NEGATIVE   Influenza B By PCR POSITIVE (A) NEGATIVE    Comment: (NOTE) The Xpert Xpress Flu assay is intended as an aid in the diagnosis of  influenza and should not be used as a sole basis for treatment.  This  assay is FDA approved for nasopharyngeal swab specimens only. Nasal  washings and aspirates are unacceptable for Xpert Xpress Flu testing.   Comprehensive metabolic panel     Status: Abnormal   Collection Time: 09/12/16  1:07 PM  Result Value Ref Range   Sodium 138 135 - 145 mmol/L   Potassium  4.7 3.5 - 5.1 mmol/L   Chloride 104 101 - 111 mmol/L   CO2 26 22 - 32 mmol/L   Glucose, Bld 196 (H) 65 - 99 mg/dL   BUN 38 (H) 6 - 20 mg/dL   Creatinine, Ser 2.01 (H) 0.44 - 1.00 mg/dL   Calcium 9.8 8.9 - 10.3 mg/dL   Total Protein 6.8 6.5 - 8.1 g/dL   Albumin 3.6 3.5 - 5.0 g/dL   AST 25 15 - 41 U/L   ALT 14 14 - 54 U/L   Alkaline Phosphatase 38 38 - 126 U/L   Total Bilirubin 0.7 0.3 - 1.2 mg/dL   GFR calc non Af Amer 23 (L) >60 mL/min   GFR calc Af Amer 26 (L) >60 mL/min    Comment: (NOTE) The eGFR has been calculated using the CKD EPI equation. This calculation has not been validated in all clinical situations. eGFR's persistently <60 mL/min signify possible Chronic Kidney Disease.    Anion gap 8 5 - 15  CBC WITH DIFFERENTIAL     Status: Abnormal   Collection Time: 09/12/16  1:07 PM  Result Value Ref Range   WBC 13.6 (H) 4.0 - 10.5 K/uL   RBC 3.66 (L) 3.87 - 5.11 MIL/uL   Hemoglobin 11.1 (L) 12.0 - 15.0 g/dL   HCT 34.4 (L) 36.0 - 46.0 %   MCV 94.0 78.0 - 100.0 fL   MCH 30.3 26.0 - 34.0 pg   MCHC 32.3 30.0 - 36.0 g/dL   RDW 13.8 11.5 - 15.5 %   Platelets 168 150 - 400 K/uL    Neutrophils Relative % 89 %   Neutro Abs 12.0 (H) 1.7 - 7.7 K/uL   Lymphocytes Relative 5 %   Lymphs Abs 0.7 0.7 - 4.0 K/uL   Monocytes Relative 6 %   Monocytes Absolute 0.9 0.1 - 1.0 K/uL   Eosinophils Relative 0 %   Eosinophils Absolute 0.0 0.0 - 0.7 K/uL   Basophils Relative 0 %   Basophils Absolute 0.0 0.0 - 0.1 K/uL  CK     Status: None   Collection Time: 09/12/16  1:07 PM  Result Value Ref Range   Total CK 151 38 - 234 U/L  I-Stat CG4 Lactic Acid, ED  (not at  [facility])     Status: None   Collection Time: 09/12/16  1:13 PM  Result Value Ref Range   Lactic Acid, Venous 0.98 0.5 - 1.9 mmol/L   _0 @  Blood pressure (!) 115/54, pulse 82, temperature (!) 100.6 F (38.1 C), temperature source Oral, height _1  (1.6 m), weight 64.4 kg (142 lb), SpO2 93 %.    Assessment/Plan 1. Sepsis with hypotension, fever, leukocytosis, altered mental status, and hypoxia. The patient will receive aggressive IV fluids and antibiotics have been started. Blood cultures x 2 were taken in the ED. Lactic acid is negative, but will be followed.  2. Hospital acquired pneumonia with hypoxia, congestion, cough, linear opacities at the left base, and fever. The patient will receive IV vancomycin and cefepime to cover for suspected resistant or gram negative organisms.  3. COPD Exacerbation: nebulizer treatments and steroids  4. Generalized weakness due to #2  5. Polymyalgia rheumatiica: The patient takes 10 mg of prednisone daily.  6. CAD: continue home medications  7. GERD: Continue home medications.  8. MGUS: The patient is relatively immunosuppressed due to MGUS and her chronic steroid use.  I have seen and examined this patient myself. I have spent 68 minutes on  her admission and care.  Jackilyn Umphlett 09/12/2016, 3:16 PM

## 2016-09-12 NOTE — ED Provider Notes (Signed)
[facility] DEPT Provider Note   CSN: [id number] Arrival date & time: 09/12/16  1206     History   Chief Complaint No chief complaint on file.   HPI Rebecca [name] is a 76 y.o. female history of CAD, COPD, diabetes, recent admission for pneumonia here presenting with cough, fever, fall. Patient lives at home with her daughter and is ambulatory at baseline. Patient was by the side of the bed yesterday and had a mechanical fall and was laying on the floor. She was too weak to get up so laid on the floor last night. This morning, family was able to get her up but very weak and had trouble bearing weight due to weakness. Not sure if she hit her head or not but has neck pain. She was noted to have low grade temp 99 yesterday. She has been coughing this morning with productive cough. Of note, patient was admitted in January this year for pneumonia and had similar symptoms at that time and finished a course of abx.   The history is provided by the patient.    Past Medical History:  Diagnosis Date  . CAD (coronary artery disease) 2006   Taxus stent of mid RCA. negative myoview 2014 and normal LV function  . CKD (chronic kidney disease) stage 4, GFR 15-29 ml/min (HCC)   . Complication of anesthesia   . COPD (chronic obstructive pulmonary disease) ([place])   . Diabetes mellitus   . Diastolic heart failure, NYHA class 1 ([place])   . Dysuria 01/06/2015  . Frozen shoulder    right  . Headache   . Hyperkalemia 01/01/2016  . 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 Obesity   . Pneumonia 06/2016  . Polymyalgia rheumatica ([place])   . PONV (postoperative nausea and vomiting)   . Renal insufficiency   . Shortness of breath   . Stroke [facility])     Patient Active Problem List   Diagnosis Date Noted  . Pressure injury of skin 07/15/2016  . Benign essential HTN 07/14/2016  . Sepsis due to pneumonia ([employer]) 07/14/2016  . Acute encephalopathy 07/14/2016  . Coronary artery disease  involving native coronary artery of native heart without angina pectoris 05/14/2016  . CKD stage 4, GFR 15-29 ml/min  08/11/2015  . 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 Leukocytosis 01/28/2014  . History of TIA - May 2015- Plavix 10/16/2013  . COPD exacerbation ([place]) 07/16/2013  . Polymyalgia rheumatica ([place]) 10/03/2012  . Trochanteric bursitis of right hip 04/11/2012  . History of small bowel obstruction 11/04/2010  . Schizoaffective disorder ([place]) 01/10/2008  . Dyslipidemia 01/25/2007  . Hypothyroidism 08/11/2006  . Anemia in chronic renal disease 08/11/2006  . GASTROESOPHAGEAL REFLUX, NO ESOPHAGITIS 08/11/2006    Past Surgical History:  Procedure Laterality Date  . ABDOMINAL HYSTERECTOMY    . CARDIAC CATHETERIZATION N/A 08/13/2015   Procedure: Left Heart Cath and Coronary Angiography;  Surgeon: Jettie Booze, MD;  Location: [facility] CV LAB;  Service: Cardiovascular;  Laterality: N/A;  . CATARACT EXTRACTION    . CHOLECYSTECTOMY    . CORONARY ANGIOPLASTY WITH STENT PLACEMENT  2006   Taxus DES to RCA, 50 % residual  . PARTIAL HYSTERECTOMY  1995    OB History    No data available       Home Medications    Prior to Admission medications   Medication Sig Start Date End Date Taking? Authorizing Provider  acetaminophen (TYLENOL) 500 MG tablet Take 1,000  mg by mouth every 6 (six) hours as needed (pain).     Historical Provider, MD  albuterol (PROAIR HFA) 108 (90 Base) MCG/ACT inhaler Inhale 2 puffs into the lungs every 6 (six) hours as needed for wheezing or shortness of breath.    Historical Provider, MD  amoxicillin-clavulanate (AUGMENTIN) 875-125 MG tablet Take 1 tablet by mouth every 12 (twelve) hours. 07/19/16   Robbie Lis, MD  aspirin EC 81 MG EC tablet Take 1 tablet (81 mg total) by mouth daily. 07/20/16   Robbie Lis, MD  cetirizine (ZYRTEC) 10 MG tablet Take 10 mg by mouth at bedtime.     Historical Provider, MD  clopidogrel (PLAVIX)  75 MG tablet Take 1 tablet (75 mg total) by mouth daily with breakfast. Patient taking differently: Take 75 mg by mouth at bedtime.  10/18/13   Thurnell Lose, MD  clozapine (CLOZARIL) 200 MG tablet Take 300 mg by mouth at bedtime. ]    Historical Provider, MD  [place] (SM CRANBERRY) 300 MG tablet Take 300 mg by mouth at bedtime.     Historical Provider, MD  denosumab (PROLIA) 60 MG/ML SOLN injection Inject 60 mg into the skin every 6 (six) months. Administer in upper arm, thigh, or abdomen    Historical Provider, MD  diltiazem (CARDIZEM LA) 180 MG 24 hr tablet Take 180 mg by mouth daily with breakfast. 07/16/15   Historical Provider, MD  docusate sodium (COLACE) 100 MG capsule Take 200 mg by mouth at bedtime.  03/01/12   Katherina Mires, MD  Fluticasone-Salmeterol (ADVAIR) 250-50 MCG/DOSE AEPB Inhale 1 puff into the lungs at bedtime.    Historical Provider, MD  isosorbide mononitrate (IMDUR) 60 MG 24 hr tablet Take 1 tablet (60 mg total) by mouth daily. 03/14/13   Isaiah Serge, NP  levothyroxine (SYNTHROID, LEVOTHROID) 75 MCG tablet Take 75 mcg by mouth daily before breakfast.    Historical Provider, MD  metoprolol succinate (TOPROL-XL) 100 MG 24 hr tablet Take 100 mg by mouth daily with breakfast. Take with or immediately following a meal.    Historical Provider, MD  Multiple Vitamins-Minerals (ONE-A-DAY MENS 50+ ADVANTAGE) TABS Take 1 tablet by mouth at bedtime.    Historical Provider, MD  nitroGLYCERIN (NITROSTAT) 0.4 MG SL tablet Place 1 tablet (0.4 mg total) under the tongue every 5 (five) minutes as needed. For chest pain 01/07/15   Pixie Casino, MD  Omega-3 Fatty Acids (FISH OIL PO) Take 1 capsule by mouth at bedtime.    Historical Provider, MD  pantoprazole (PROTONIX) 40 MG tablet Take 1 tablet (40 mg total) by mouth daily. Patient taking differently: Take 40 mg by mouth daily before breakfast.  04/03/13   Pixie Casino, MD  polyethylene glycol powder (GLYCOLAX/MIRALAX) powder Take 17 g by  mouth daily. [place]    Historical Provider, MD  pravastatin (PRAVACHOL) 20 MG tablet Take 20 mg by mouth at bedtime.    Historical Provider, MD  predniSONE (DELTASONE) 10 MG tablet Take 10 mg by mouth daily with breakfast.    Historical Provider, MD  temazepam (RESTORIL) 15 MG capsule Take 15 mg by mouth at bedtime as needed for sleep.    Historical Provider, MD  Tiotropium Bromide Monohydrate (SPIRIVA RESPIMAT) 2.5 MCG/ACT AERS Inhale 2 puffs into the lungs every morning.     Historical Provider, MD  traMADol (ULTRAM) 50 MG tablet Take 100 mg by mouth 2 (two) times daily as needed (for breakthrough pain).  Historical Provider, MD  traMADol (ULTRAM-ER) 300 MG 24 hr tablet Take 300 mg by mouth every morning.    Historical Provider, MD  Vitamin D, Ergocalciferol, (DRISDOL) 50000 UNITS CAPS capsule Take 1 capsule (50,000 Units total) by mouth every 7 (seven) days. 08/27/13   Kinnie Feil, MD    Family History Family History  Problem Relation Age of Onset  . Diabetes Mother   . Heart disease Mother   . Heart disease Father   . Arthritis Father   . Healthy Sister   . Heart disease Brother   . Cancer Brother     colon ca  . Osteoporosis Sister   . Heart disease Brother   . Diabetes Brother   . Heart disease Brother   . Diabetes Brother   . Diabetes Brother   . Healthy Brother   . Healthy Brother   . Healthy Brother   . Cancer Maternal Uncle     blood condition  . Cancer Daughter     breast ca  . Cancer Cousin     NHL    Social History Social History  Substance Use Topics  . Smoking status: Former Smoker    Packs/day: 1.00    Years: 35.00    Quit date: 04/14/2004  . Smokeless tobacco: Never Used  . Alcohol use No     Allergies   Levaquin [levofloxacin] and Tape   Review of Systems Review of Systems  Constitutional: Positive for fever.  Respiratory: Positive for cough.   All other systems reviewed and are negative.    Physical Exam Updated Vital  Signs BP (!) 115/54 (BP Location: Left Arm)   Pulse 82   Temp (!) 100.6 F (38.1 C) (Oral) Comment: RN aware  Ht 5\' 3"  (1.6 m)   Wt 142 lb (64.4 kg)   SpO2 93% Comment: Simultaneous filing. User may not have seen previous data.  BMI 25.15 kg/m   Physical Exam  Constitutional:  Chronically ill, tired, uncomfortable   HENT:  Head: Normocephalic.  MM dry   Eyes: EOM are normal. Pupils are equal, round, and reactive to light.  Neck: Normal range of motion. Neck supple.  Cardiovascular: Normal rate, regular rhythm and normal heart sounds.   Pulmonary/Chest:  Crackles R base   Abdominal: Soft. Bowel sounds are normal. She exhibits no distension. There is no tenderness. There is no guarding.  Musculoskeletal:  Pelvis stable, able to range bilateral hips. Bruising all over (chronic), no obvious bony tenderness. Skin tear R lower leg, no active bleeding   Neurological:  Tired, moving all extremities. Able to follow commands   Skin: Skin is warm.  Psychiatric:  Unable   Nursing note and vitals reviewed.    ED Treatments / Results  Labs (all labs ordered are listed, but only abnormal results are displayed) Labs Reviewed  COMPREHENSIVE METABOLIC PANEL - Abnormal; Notable for the following:       Result Value   Glucose, Bld 196 (*)    BUN 38 (*)    Creatinine, Ser 2.01 (*)    GFR calc non Af Amer 23 (*)    GFR calc Af Amer 26 (*)    All other components within normal limits  CBC WITH DIFFERENTIAL/PLATELET - Abnormal; Notable for the following:    WBC 13.6 (*)    RBC 3.66 (*)    Hemoglobin 11.1 (*)    HCT 34.4 (*)    Neutro Abs 12.0 (*)    All other components within normal  limits  URINALYSIS, ROUTINE W REFLEX MICROSCOPIC - Abnormal; Notable for the following:    APPearance HAZY (*)    Protein, ur 30 (*)    Bacteria, UA MANY (*)    All other components within normal limits  CULTURE, BLOOD (ROUTINE X 2)  CULTURE, BLOOD (ROUTINE X 2)  URINE CULTURE  CK  INFLUENZA PANEL  BY PCR (TYPE A & B)  I-STAT CG4 LACTIC ACID, ED    EKG  EKG Interpretation  Date/Time:  Sunday September 12 2016 13:36:30 EDT Ventricular Rate:  77 PR Interval:    QRS Duration: 94 QT Interval:  380 QTC Calculation: 430 R Axis:   41 Text Interpretation:  Sinus rhythm Left atrial enlargement Probable left ventricular hypertrophy No significant change since last tracing Confirmed by Shanika Levings  MD, Omolara Carol ([id number]) on 09/12/2016 1:41:19 PM       Radiology Dg Chest 1 View  Result Date: 09/12/2016 CLINICAL DATA:  Productive cough.  Fell last night. EXAM: CHEST 1 VIEW COMPARISON:  07/14/2016. FINDINGS: Borderline enlarged cardiac silhouette. Aortic calcification. Stable linear densities at the left lung base and small amount of left pleural thickening or fluid. Lower thoracic spine and bilateral shoulder degenerative changes. IMPRESSION: 1. Stable left basilar linear atelectasis or scarring and small amount of left pleural thickening or fluid. 2. Aortic atherosclerosis. Electronically Signed   By: Claudie Revering M.D.   On: 09/12/2016 14:16   Dg Pelvis 1-2 Views  Result Date: 09/12/2016 CLINICAL DATA:  Pelvic pain following a fall last night. EXAM: PELVIS - 1-2 VIEW COMPARISON:  Pelvis CT dated 09/25/2015 FINDINGS: No pelvic fracture or dislocation seen. Lower lumbar spine degenerative changes. IMPRESSION: No fracture. Electronically Signed   By: Claudie Revering M.D.   On: 09/12/2016 14:18   Ct Head Wo Contrast  Result Date: 09/12/2016 CLINICAL DATA:  Per EMS, pt is coming from home after experiencing a fall at home last night. Pt lives with her daughter and daughter stated pt did not hit her head when fall occurred. Daughter reports that pt had a fever and a productive cough last night and had increased weakness after the fall occurred. Pt usually ambulates independently but now requires assistance to get up out of the bed. Pt AO x4. EXAM: CT HEAD WITHOUT CONTRAST CT CERVICAL SPINE WITHOUT CONTRAST TECHNIQUE:  Multidetector CT imaging of the head and cervical spine was performed following the standard protocol without intravenous contrast. Multiplanar CT image reconstructions of the cervical spine were also generated. COMPARISON:  07/14/2016 FINDINGS: CT HEAD FINDINGS Brain: No evidence of acute infarction, hemorrhage, hydrocephalus, extra-axial collection or mass lesion/mass effect. Age related volume loss. Mild chronic microvascular ischemic change. 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 Stable appearance from the prior exam. Vascular: No hyperdense vessel or unexpected calcification. Skull: Normal. Negative for fracture or focal lesion. Sinuses/Orbits: Visualized globes and orbits are unremarkable. Visualized sinuses and mastoid air cells are clear. Other: None. CT CERVICAL SPINE FINDINGS Alignment: Mild reversal of the normal cervical lordosis, apex at C4. No spondylolisthesis. Skull base and vertebrae: No acute fracture. No primary bone lesion or focal pathologic process. Soft tissues and spinal canal: No prevertebral fluid or swelling. No visible canal hematoma. Disc levels: Mild loss of disc height from C3-C4 through C5-C6. Endplate spurring noted from C4 through C7. Facet degenerative change noted most prominent on the right at C2-C3. No disc herniations. Upper chest: No masses.  No acute findings. Other: None. IMPRESSION: HEAD CT:  No acute intracranial abnormalities.  No skull  fracture. CERVICAL CT:  No fracture or acute finding. Electronically Signed   By: Lajean Manes M.D.   On: 09/12/2016 14:29   Ct Cervical Spine Wo Contrast  Result Date: 09/12/2016 CLINICAL DATA:  Per EMS, pt is coming from home after experiencing a fall at home last night. Pt lives with her daughter and daughter stated pt did not hit her head when fall occurred. Daughter reports that pt had a fever and a productive cough last night and had increased weakness after the fall occurred. Pt usually ambulates independently but now  requires assistance to get up out of the bed. Pt AO x4. EXAM: CT HEAD WITHOUT CONTRAST CT CERVICAL SPINE WITHOUT CONTRAST TECHNIQUE: Multidetector CT imaging of the head and cervical spine was performed following the standard protocol without intravenous contrast. Multiplanar CT image reconstructions of the cervical spine were also generated. COMPARISON:  07/14/2016 FINDINGS: CT HEAD FINDINGS Brain: No evidence of acute infarction, hemorrhage, hydrocephalus, extra-axial collection or mass lesion/mass effect. Age related volume loss. Mild chronic microvascular ischemic change. 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 Stable appearance from the prior exam. Vascular: No hyperdense vessel or unexpected calcification. Skull: Normal. Negative for fracture or focal lesion. Sinuses/Orbits: Visualized globes and orbits are unremarkable. Visualized sinuses and mastoid air cells are clear. Other: None. CT CERVICAL SPINE FINDINGS Alignment: Mild reversal of the normal cervical lordosis, apex at C4. No spondylolisthesis. Skull base and vertebrae: No acute fracture. No primary bone lesion or focal pathologic process. Soft tissues and spinal canal: No prevertebral fluid or swelling. No visible canal hematoma. Disc levels: Mild loss of disc height from C3-C4 through C5-C6. Endplate spurring noted from C4 through C7. Facet degenerative change noted most prominent on the right at C2-C3. No disc herniations. Upper chest: No masses.  No acute findings. Other: None. IMPRESSION: HEAD CT:  No acute intracranial abnormalities.  No skull fracture. CERVICAL CT:  No fracture or acute finding. Electronically Signed   By: Lajean Manes M.D.   On: 09/12/2016 14:29    Procedures Procedures (including critical care time)  Medications Ordered in ED Medications  sodium chloride 0.9 % bolus 1,000 mL (1,000 mLs Intravenous New Bag/Given 09/12/16 1336)    And  sodium chloride 0.9 % bolus 1,000 mL (1,000 mLs Intravenous New Bag/Given 09/12/16  1439)  vancomycin (VANCOCIN) IVPB 1000 mg/200 mL premix (1,000 mg Intravenous New Bag/Given 09/12/16 1439)  vancomycin (VANCOCIN) IVPB 750 mg/150 ml premix (not administered)  ceFEPIme (MAXIPIME) 1 g in dextrose 5 % 50 mL IVPB (not administered)  ceFEPIme (MAXIPIME) 2 g in dextrose 5 % 50 mL IVPB (0 g Intravenous Stopped 09/12/16 1439)     Initial Impression / Assessment and Plan / ED Course  I have reviewed the triage vital signs and the nursing notes.  Pertinent labs & imaging results that were available during my care of the patient were reviewed by me and considered in my medical decision making (see chart for details).     Mosella A Mcfadyen is a 76 y.o. female her with fall, AMS. Febrile 100.6 F in the ED, altered and tired. Will activate code sepsis and get labs, UA, cultures, CXR. Given fall, will get CT head, neck, pelvis xray. Likely has pneumonia and will treat for HCAP empirically.    2:52 PM WBC 14. CXR showed L base atelectasis vs pneumonia. Lactate nl. UA nl. Still confused. CT head/neck unremarkable. Will admit for metabolic encephalopathy from HCAP pneumonia.   Final Clinical Impressions(s) / ED Diagnoses  Final diagnoses:  None    New Prescriptions New Prescriptions   No medications on file     Drenda Freeze, MD 09/12/16 1453

## 2016-09-13 ENCOUNTER — Inpatient Hospital Stay (HOSPITAL_COMMUNITY): Payer: Medicare Other

## 2016-09-13 DIAGNOSIS — J111 Influenza due to unidentified influenza virus with other respiratory manifestations: Secondary | ICD-10-CM

## 2016-09-13 LAB — CBC WITH DIFFERENTIAL/PLATELET
Basophils Absolute: 0 10*3/uL (ref 0.0–0.1)
Basophils Relative: 0 %
Eosinophils Absolute: 0 10*3/uL (ref 0.0–0.7)
Eosinophils Relative: 0 %
HEMATOCRIT: 30.8 % — AB (ref 36.0–46.0)
Hemoglobin: 10.2 g/dL — ABNORMAL LOW (ref 12.0–15.0)
LYMPHS PCT: 16 %
Lymphs Abs: 1.5 10*3/uL (ref 0.7–4.0)
MCH: 30.9 pg (ref 26.0–34.0)
MCHC: 33.1 g/dL (ref 30.0–36.0)
MCV: 93.3 fL (ref 78.0–100.0)
MONO ABS: 1.1 10*3/uL — AB (ref 0.1–1.0)
MONOS PCT: 12 %
NEUTROS ABS: 6.9 10*3/uL (ref 1.7–7.7)
Neutrophils Relative %: 72 %
Platelets: 135 10*3/uL — ABNORMAL LOW (ref 150–400)
RBC: 3.3 MIL/uL — ABNORMAL LOW (ref 3.87–5.11)
RDW: 13.9 % (ref 11.5–15.5)
WBC: 9.6 10*3/uL (ref 4.0–10.5)

## 2016-09-13 LAB — BLOOD GAS, ARTERIAL
Acid-base deficit: 1.4 mmol/L (ref 0.0–2.0)
Bicarbonate: 23.6 mmol/L (ref 20.0–28.0)
DELIVERY SYSTEMS: POSITIVE
DRAWN BY: 422461
Expiratory PAP: 5
FIO2: 30
Inspiratory PAP: 14
LHR: 12 {breaths}/min
MODE: POSITIVE
O2 SAT: 94.9 %
PH ART: 7.367 (ref 7.350–7.450)
Patient temperature: 97
pCO2 arterial: 41.5 mmHg (ref 32.0–48.0)
pO2, Arterial: 77.1 mmHg — ABNORMAL LOW (ref 83.0–108.0)

## 2016-09-13 LAB — BASIC METABOLIC PANEL
Anion gap: 7 (ref 5–15)
BUN: 38 mg/dL — ABNORMAL HIGH (ref 6–20)
CALCIUM: 8.3 mg/dL — AB (ref 8.9–10.3)
CO2: 23 mmol/L (ref 22–32)
CREATININE: 1.66 mg/dL — AB (ref 0.44–1.00)
Chloride: 110 mmol/L (ref 101–111)
GFR calc Af Amer: 33 mL/min — ABNORMAL LOW (ref >60)
GFR calc non Af Amer: 29 mL/min — ABNORMAL LOW (ref >60)
Glucose, Bld: 81 mg/dL (ref 65–99)
Potassium: 4.3 mmol/L (ref 3.5–5.1)
Sodium: 140 mmol/L (ref 135–145)

## 2016-09-13 LAB — MRSA PCR SCREENING: MRSA BY PCR: NEGATIVE

## 2016-09-13 LAB — GLUCOSE, CAPILLARY: GLUCOSE-CAPILLARY: 174 mg/dL — AB (ref 65–99)

## 2016-09-13 LAB — STREP PNEUMONIAE URINARY ANTIGEN: Strep Pneumo Urinary Antigen: NEGATIVE

## 2016-09-13 MED ORDER — ORAL CARE MOUTH RINSE
15.0000 mL | Freq: Two times a day (BID) | OROMUCOSAL | Status: DC
  Administered 2016-09-13 – 2016-09-16 (×6): 15 mL via OROMUCOSAL

## 2016-09-13 NOTE — Progress Notes (Signed)
I was called by the nurses for the change in patient's status and updating. Patient was admitted earlier with sepsis likely secondary to hospital-acquired pneumonia complicated by hypoxia. Was started on broad-spectrum antibiotics but the hospital course was complicated by worsening of her mental status and shortness of breath. Apparently rapid response was called and she was noted to be hypoxic therefore placed on BiPAP, ABG done showed  acute respiratory acidosis with hypercapnia. Patient was transferred to stepdown unit. Over the course of an hour her mental status slightly improved to a point where she was easily arousable but still drowsy. Repeat ABG showed much improvement. Chest x-ray ordered is still pending at this time. I am unable to obtain full neuro exam as she is drowsy but easily arousable. Slight diffuse expiratory coarse breath sounds. At this time continue broad-spectrum antibiotics, gentle hydration and obtain chest x-ray. Continue nebulizer treatments and steroids. If her breathing status does not improve consider pulmonary embolism as cause of her respiratory distress/low-grade fever. Caution with getting CT  angiogram study  due to elevated renal function. Continue to provide supportive care. Case discussed with nursing staff. Call with further questions if necessary. Level of care changed to stepdown unit.

## 2016-09-13 NOTE — Care Management Note (Signed)
Case Management Note  Patient Details  Name: ORIYAH LAMPHEAR MRN: [id number] Date of Birth: [birth year]  Subjective/Objective:     Dyspnea requiring Bi-pap               Action/Plan:Date:  September 13, 2016 Chart reviewed for concurrent status and case management needs. Will continue to follow patient progress. Discharge Planning: following for needs/ home with adult children near by. Expected discharge date: [id number] Velva Harman, BSN, [place], [place]   Expected Discharge Date:                  Expected Discharge Plan:  Home/Self Care  In-House Referral:     Discharge planning Services  CM Consult  Post Acute Care Choice:    Choice offered to:     DME Arranged:    DME Agency:     HH Arranged:    HH Agency:     Status of Service:  In process, will continue to follow  If discussed at Long Length of Stay Meetings, dates discussed:    Additional Comments:  Leeroy Cha, RN 09/13/2016, 9:39 AM

## 2016-09-13 NOTE — Progress Notes (Signed)
Patient seen by Reesa Chew, MD.

## 2016-09-13 NOTE — Progress Notes (Signed)
Patient Demographics:    Rebecca [name], is a 76 y.o. female, DOB - Oct 17, 1940, JJO:[id number]  Admit date - 09/12/2016   Admitting Physician Karie Kirks, DO  Outpatient Primary MD for the patient is [name], MD  LOS - 1   No chief complaint on file.       Subjective:    Rebecca [name] today has , no emesis,  No chest pain,  Cough or shortness of breath persist   Assessment  & Plan :    Active Problems:   Hypothyroidism   Dyslipidemia   Anemia in chronic renal disease   Schizoaffective disorder (HCC)   GASTROESOPHAGEAL REFLUX, NO ESOPHAGITIS   Polymyalgia rheumatica (HCC)   COPD exacerbation (HCC)   MGUS (monoclonal gammopathy of unknown significance)   CKD stage 4, GFR 15-29 ml/min    Coronary artery disease involving native coronary artery of native heart without angina pectoris   Benign essential HTN   Sepsis due to pneumonia (HCC)   Healthcare-associated pneumonia   AKI (acute kidney injury) ([place])   HAP (hospital-acquired pneumonia)   Respiratory distress   Brief summary:- Patient was discharged from this facility on 07/19/2016 after a stay for pneumonia and sepsis. Present signal is respiratory symptoms, influenza B+, chest x-ray suggest left-sided pneumonia, empirically started on IV cefepime and vancomycin for possible HCAP  Plan:- 1)Sepsis with hypotension-secondary to influenza B and left-sided pneumonia, Had Hypoxic and Hypercarpneic resp failure overnight, required BIPAP , Leukoctosis and  Fever has resolved,, mental status is improving, and hypoxia is improving. Lactic acid is negative. Patient's daughter declines further IV fluids at this time. Continue IV cefepime and vancomycin for possible HCAP pending cultures. Continue Tamiflu for influenza B. Continue bronchodilators  2)HTN/CAD- no ACS type symptoms at this time, continue Toprol-XL, pravastatin, isosorbide , aspirin and Plavix    3) Generalized weakness /history of polymyalgia rheumatica/MGUS-continue prednisone 10 mg daily , get physical therapy evaluation once respiratory status improves further due to #2  4)CKD- creatinine appears to be at or below her recent baseline, creatinine today is down to 1.6 from 2.0 on admission after IV fluids,  avoid nephrotoxic agent, watch renal function closely given her recent hypotension and vancomycin use   Code Status : Full   Disposition Plan  : TBD, await physical therapy evaluation    DVT Prophylaxis  :  Heparin    Lab Results  Component Value Date   PLT 135 (L) 09/13/2016    Inpatient Medications  Scheduled Meds: . albuterol  2.5 mg Nebulization TID  . aspirin EC  81 mg Oral Daily  . ceFEPime (MAXIPIME) IV  1 g Intravenous Q24H  . clopidogrel  75 mg Oral QHS  . clozapine  300 mg Oral QHS  . diltiazem  180 mg Oral Q breakfast  . docusate sodium  200 mg Oral QHS  . heparin  5,000 Units Subcutaneous Q8H  . isosorbide mononitrate  60 mg Oral Daily  . levothyroxine  75 mcg Oral QAC breakfast  . loratadine  10 mg Oral Daily  . metoprolol succinate  100 mg Oral Q breakfast  . mometasone-formoterol  2 puff Inhalation BID  . multivitamin with minerals  1 tablet Oral QHS  . oseltamivir  30 mg Oral Q24H  .  pantoprazole  40 mg Oral Daily  . polyethylene glycol  17 g Oral Daily  . pravastatin  20 mg Oral QHS  . predniSONE  10 mg Oral Q breakfast  . tiotropium  18 mcg Inhalation Daily  . traMADol  100 mg Oral TID  . vancomycin  750 mg Intravenous Q24H  . Vitamin D (Ergocalciferol)  50,000 Units Oral Q7 days   Continuous Infusions: . sodium chloride Stopped (09/12/16 2210)   PRN Meds:.acetaminophen **OR** acetaminophen, albuterol, bisacodyl, magnesium citrate, nitroGLYCERIN, senna-docusate, temazepam, traMADol, traZODone    Anti-infectives    Start     Dose/Rate Route Frequency Ordered Stop   09/13/16 1430  vancomycin (VANCOCIN) IVPB 750 mg/150 ml premix      750 mg 150 mL/hr over 60 Minutes Intravenous Every 24 hours 09/12/16 1416     09/13/16 1400  ceFEPIme (MAXIPIME) 1 g in dextrose 5 % 50 mL IVPB  Status:  Discontinued     1 g 100 mL/hr over 30 Minutes Intravenous Every 24 hours 09/12/16 1416 09/12/16 1515   09/13/16 1400  ceFEPIme (MAXIPIME) 1 g in dextrose 5 % 50 mL IVPB     1 g 100 mL/hr over 30 Minutes Intravenous Every 24 hours 09/12/16 1515     09/12/16 2200  oseltamivir (TAMIFLU) capsule 30 mg     30 mg Oral Every 24 hours 09/12/16 2022 09/17/16 2159   09/12/16 1515  ceFEPIme (MAXIPIME) 1 g in dextrose 5 % 50 mL IVPB  Status:  Discontinued     1 g 100 mL/hr over 30 Minutes Intravenous Every 8 hours 09/12/16 1510 09/12/16 1515   09/12/16 1300  ceFEPIme (MAXIPIME) 2 g in dextrose 5 % 50 mL IVPB     2 g 100 mL/hr over 30 Minutes Intravenous  Once 09/12/16 1247 09/12/16 1439   09/12/16 1300  vancomycin (VANCOCIN) IVPB 1000 mg/200 mL premix     1,000 mg 200 mL/hr over 60 Minutes Intravenous  Once 09/12/16 1247 09/12/16 1618        Objective:   Vitals:   09/13/16 0400 09/13/16 0500 09/13/16 0600 09/13/16 0700  BP: 129/68 (!) 145/65 (!) 143/62 133/60  Pulse: 68 66 71 72  Resp: 14 13 14 15   Temp:      TempSrc:      SpO2: 93% 92% 93% 91%  Weight:      Height:        Wt Readings from Last 3 Encounters:  09/12/16 64.4 kg (142 lb)  07/19/16 65 kg (143 lb 6.4 oz)  05/13/16 70.9 kg (156 lb 6.4 oz)    No intake or output data in the 24 hours ending 09/13/16 0744   Physical Exam  Gen:- Awake ,  But somewhat Sleepy HEENT:- [place].AT, No sclera icterus, [place] at 3 L/min Neck-Supple Neck,No JVD,.  Lungs-  diminished in bases with scattered rhonchi left more than right  CV- S1, S2 normal Abd-  +ve B.Sounds, Abd Soft, No tenderness,    Extremity/Skin:- Negative [place]    Data Review:   Micro Results Recent Results (from the past 240 hour(s))  MRSA PCR Screening     Status: None   Collection Time: 09/12/16 10:59 PM  Result  Value Ref Range Status   MRSA by PCR NEGATIVE NEGATIVE Final    Comment:        The GeneXpert MRSA Assay (FDA approved for NASAL specimens only), is one component of a comprehensive MRSA colonization surveillance program. It is not intended to diagnose  MRSA infection nor to guide or monitor treatment for MRSA infections.     Radiology Reports Dg Chest 1 View  Result Date: 09/12/2016 CLINICAL DATA:  Productive cough.  Fell last night. EXAM: CHEST 1 VIEW COMPARISON:  07/14/2016. FINDINGS: Borderline enlarged cardiac silhouette. Aortic calcification. Stable linear densities at the left lung base and small amount of left pleural thickening or fluid. Lower thoracic spine and bilateral shoulder degenerative changes. IMPRESSION: 1. Stable left basilar linear atelectasis or scarring and small amount of left pleural thickening or fluid. 2. Aortic atherosclerosis. Electronically Signed   By: Claudie Revering M.D.   On: 09/12/2016 14:16   Dg Pelvis 1-2 Views  Result Date: 09/12/2016 CLINICAL DATA:  Pelvic pain following a fall last night. EXAM: PELVIS - 1-2 VIEW COMPARISON:  Pelvis CT dated 09/25/2015 FINDINGS: No pelvic fracture or dislocation seen. Lower lumbar spine degenerative changes. IMPRESSION: No fracture. Electronically Signed   By: Claudie Revering M.D.   On: 09/12/2016 14:18   Ct Head Wo Contrast  Result Date: 09/12/2016 CLINICAL DATA:  Per EMS, pt is coming from home after experiencing a fall at home last night. Pt lives with her daughter and daughter stated pt did not hit her head when fall occurred. Daughter reports that pt had a fever and a productive cough last night and had increased weakness after the fall occurred. Pt usually ambulates independently but now requires assistance to get up out of the bed. Pt AO x4. EXAM: CT HEAD WITHOUT CONTRAST CT CERVICAL SPINE WITHOUT CONTRAST TECHNIQUE: Multidetector CT imaging of the head and cervical spine was performed following the standard protocol  without intravenous contrast. Multiplanar CT image reconstructions of the cervical spine were also generated. COMPARISON:  07/14/2016 FINDINGS: CT HEAD FINDINGS Brain: No evidence of acute infarction, hemorrhage, hydrocephalus, extra-axial collection or mass lesion/mass effect. Age related volume loss. Mild chronic microvascular ischemic change. 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 Stable appearance from the prior exam. Vascular: No hyperdense vessel or unexpected calcification. Skull: Normal. Negative for fracture or focal lesion. Sinuses/Orbits: Visualized globes and orbits are unremarkable. Visualized sinuses and mastoid air cells are clear. Other: None. CT CERVICAL SPINE FINDINGS Alignment: Mild reversal of the normal cervical lordosis, apex at C4. No spondylolisthesis. Skull base and vertebrae: No acute fracture. No primary bone lesion or focal pathologic process. Soft tissues and spinal canal: No prevertebral fluid or swelling. No visible canal hematoma. Disc levels: Mild loss of disc height from C3-C4 through C5-C6. Endplate spurring noted from C4 through C7. Facet degenerative change noted most prominent on the right at C2-C3. No disc herniations. Upper chest: No masses.  No acute findings. Other: None. IMPRESSION: HEAD CT:  No acute intracranial abnormalities.  No skull fracture. CERVICAL CT:  No fracture or acute finding. Electronically Signed   By: Lajean Manes M.D.   On: 09/12/2016 14:29   Ct Cervical Spine Wo Contrast  Result Date: 09/12/2016 CLINICAL DATA:  Per EMS, pt is coming from home after experiencing a fall at home last night. Pt lives with her daughter and daughter stated pt did not hit her head when fall occurred. Daughter reports that pt had a fever and a productive cough last night and had increased weakness after the fall occurred. Pt usually ambulates independently but now requires assistance to get up out of the bed. Pt AO x4. EXAM: CT HEAD WITHOUT CONTRAST CT CERVICAL SPINE  WITHOUT CONTRAST TECHNIQUE: Multidetector CT imaging of the head and cervical spine was performed following the standard  protocol without intravenous contrast. Multiplanar CT image reconstructions of the cervical spine were also generated. COMPARISON:  07/14/2016 FINDINGS: CT HEAD FINDINGS Brain: No evidence of acute infarction, hemorrhage, hydrocephalus, extra-axial collection or mass lesion/mass effect. Age related volume loss. Mild chronic microvascular ischemic change. 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 Stable appearance from the prior exam. Vascular: No hyperdense vessel or unexpected calcification. Skull: Normal. Negative for fracture or focal lesion. Sinuses/Orbits: Visualized globes and orbits are unremarkable. Visualized sinuses and mastoid air cells are clear. Other: None. CT CERVICAL SPINE FINDINGS Alignment: Mild reversal of the normal cervical lordosis, apex at C4. No spondylolisthesis. Skull base and vertebrae: No acute fracture. No primary bone lesion or focal pathologic process. Soft tissues and spinal canal: No prevertebral fluid or swelling. No visible canal hematoma. Disc levels: Mild loss of disc height from C3-C4 through C5-C6. Endplate spurring noted from C4 through C7. Facet degenerative change noted most prominent on the right at C2-C3. No disc herniations. Upper chest: No masses.  No acute findings. Other: None. IMPRESSION: HEAD CT:  No acute intracranial abnormalities.  No skull fracture. CERVICAL CT:  No fracture or acute finding. Electronically Signed   By: Lajean Manes M.D.   On: 09/12/2016 14:29   Dg Chest Port 1 View  Result Date: 09/13/2016 CLINICAL DATA:  Abnormal breathing sounds. History of COPD, CHF, diabetes. EXAM: PORTABLE CHEST 1 VIEW COMPARISON:  Chest radiograph September 12, 2016 FINDINGS: The cardiac silhouette is mildly enlarged unchanged. Calcified aortic knob. Similar patchy LEFT lung base airspace opacity, blunted costophrenic angle. RIGHT lung is clear. No  pneumothorax. Soft tissue planes and included osseous structures are nonsuspicious. IMPRESSION: Stable LEFT basilar airspace opacity with small LEFT pleural effusion versus pleural thickening. Electronically Signed   By: Elon Alas M.D.   On: 09/13/2016 01:58     CBC  Recent Labs Lab 09/12/16 1307 09/13/16 0348  WBC 13.6* 9.6  HGB 11.1* 10.2*  HCT 34.4* 30.8*  PLT 168 135*  MCV 94.0 93.3  MCH 30.3 30.9  MCHC 32.3 33.1  RDW 13.8 13.9  LYMPHSABS 0.7 1.5  MONOABS 0.9 1.1*  EOSABS 0.0 0.0  BASOSABS 0.0 0.0    Chemistries   Recent Labs Lab 09/12/16 1307 09/13/16 0348  NA 138 140  K 4.7 4.3  CL 104 110  CO2 26 23  GLUCOSE 196* 81  BUN 38* 38*  CREATININE 2.01* 1.66*  CALCIUM 9.8 8.3*  AST 25  --   ALT 14  --   ALKPHOS 38  --   BILITOT 0.7  --    ------------------------------------------------------------------------------------------------------------------ No results for input(s): CHOL, [name], [name], [name], [name], LDLDIRECT in the last 72 hours.  Lab Results  Component Value Date   HGBA1C 5.7 (H) 10/17/2013   ------------------------------------------------------------------------------------------------------------------ No results for input(s): TSH, T4TOTAL, T3FREE, THYROIDAB in the last 72 hours.  Invalid input(s): FREET3 ------------------------------------------------------------------------------------------------------------------ No results for input(s): VITAMINB12, FOLATE, FERRITIN, TIBC, IRON, RETICCTPCT in the last 72 hours.  Coagulation profile No results for input(s): INR, PROTIME in the last 168 hours.  No results for input(s): DDIMER in the last 72 hours.  Cardiac Enzymes No results for input(s): [name], TROPONINI, MYOGLOBIN in the last 168 hours.  Invalid input(s): CK ------------------------------------------------------------------------------------------------------------------    Component Value Date/Time   BNP 159.5 (H)  07/14/2016 1525   BNP 56.1 08/19/2015 1026     Noa Galvao M.D on 09/13/2016 at 7:44 AM  Between 7am to 7pm - Pager - [phone]  After 7pm go to [email or web address] -  password [email or web address]  [facility] -  Office  [phone]  Dragon dictation system was used to create this note, attempts have been made to correct errors, however presence of uncorrected errors is not a reflection quality of care provided

## 2016-09-13 NOTE — Progress Notes (Signed)
Before transition to ICU, Hui, Therapist, sports (Surveyor, mining) contacted on call [facility] provider. Hui talked to NP about patient being transferred to ICU on a Rapid Response call. [facility] NP instructed Hui to contact Benny Lennert, MD. Patient arrived at ICU approximately 2230 and patient has not been seen by a provider. It is now 0112. Primary RN contacted CCM. CCM now onboard giving as needed orders. Charge RN attempted to contact Benny Lennert, MD in ED, but was able to get a hold of Reesa Chew, MD. Reesa Chew, MD also added a few orders. Primary RN also contacted on call [place] NP again to make her aware of current situation. [facility] NP contacted Reesa Chew, MD and stated he would be at bedside shortly.

## 2016-09-14 MED ORDER — HYDRALAZINE HCL 20 MG/ML IJ SOLN
5.0000 mg | INTRAMUSCULAR | Status: DC | PRN
  Administered 2016-09-15 (×2): 5 mg via INTRAVENOUS
  Filled 2016-09-14 (×2): qty 1

## 2016-09-14 MED ORDER — HYDRALAZINE HCL 10 MG PO TABS
10.0000 mg | ORAL_TABLET | Freq: Three times a day (TID) | ORAL | Status: DC
  Administered 2016-09-14 – 2016-09-16 (×8): 10 mg via ORAL
  Filled 2016-09-14 (×8): qty 1

## 2016-09-14 MED ORDER — ENSURE ENLIVE PO LIQD
237.0000 mL | Freq: Two times a day (BID) | ORAL | Status: DC
  Administered 2016-09-15 – 2016-09-16 (×3): 237 mL via ORAL

## 2016-09-14 NOTE — Consult Note (Signed)
   [facility] CM Inpatient Consult   09/14/2016  [name] 02/19/1941 [id number]     Patient screened for [facility] services. Chart reviewed. Noted patient is currently in stepdown unit. Will continue to follow and will engage at a more appropriate time.    Marthenia Rolling, MSN-Ed, RN,BSN [facility] Liaison [phone]

## 2016-09-14 NOTE — Evaluation (Signed)
Physical Therapy Evaluation Patient Details Name: Rebecca [name] MRN: [id number] DOB: [birth year] Today's Date: 09/14/2016   History of Present Illness  Rebecca [name] is a 76 y.o. female admitted on 09/12/16 with respiratory distres. Recently in hopital for pneumonia. positive for influenza B. Medical history significant for recently discovered IgG lambda MGUS, memory loss, PMR (on prednisone), HTN  Clinical Impression  The patient Is very pleasant.Oriented to hospital ,month and day. Daughter present and is interested in SNF for rehab for patient. Pt admitted with above diagnosis. Pt currently with functional limitations due to the deficits listed below (see PT Problem List).  Pt will benefit from skilled PT to increase their independence and safety with mobility to allow discharge to the venue listed below.       Follow Up Recommendations SNF;Supervision/Assistance - 24 hour    Equipment Recommendations  None recommended by PT    Recommendations for Other Services       Precautions / Restrictions Precautions Precautions: Fall Precaution Comments: monitor VS      Mobility  Bed Mobility Overal bed mobility: Needs Assistance Bed Mobility: Supine to Sit     Supine to sit: Min assist        Transfers Overall transfer level: Needs assistance Equipment used: 1 person hand held assist Transfers: Sit to/from [facility] Sit to Stand: Min assist Stand pivot transfers: Min assist       General transfer comment: asisted to BSc then to recliner  Ambulation/Gait                Stairs            Wheelchair Mobility    Modified Rankin (Stroke Patients Only)       Balance Overall balance assessment: Needs assistance Sitting-balance support: Feet supported;No upper extremity supported Sitting balance-Leahy Scale: Good     Standing balance support: Single extremity supported;During functional activity Standing balance-Leahy Scale: Fair                                Pertinent Vitals/Pain Pain Assessment: No/denies pain    Home Living Family/patient expects to be discharged to:: Private residence Living Arrangements: Children Available Help at Discharge: Available PRN/intermittently Type of Home: House Home Access: Stairs to enter Entrance Stairs-Rails: Right Entrance Stairs-Number of Steps: 4 Home Layout: Two level;Able to live on main level with bedroom/bathroom Home Equipment: Shower seat;Walker - 2 wheels;Walker - 4 wheels;Hospital bed;Bedside commode      Prior Function Level of Independence: Independent with assistive device(s)         Comments: used rollator when out of the house.       Hand Dominance        Extremity/Trunk Assessment   Upper Extremity Assessment Upper Extremity Assessment: Generalized weakness    Lower Extremity Assessment Lower Extremity Assessment: Generalized weakness    Cervical / Trunk Assessment Cervical / Trunk Assessment: Normal  Communication      Cognition Arousal/Alertness: Awake/alert Behavior During Therapy: WFL for tasks assessed/performed Overall Cognitive Status: History of cognitive impairments - at baseline                                        General Comments      Exercises     Assessment/Plan    PT Assessment Patient needs continued PT services  PT Problem List Decreased strength;Decreased activity tolerance;Decreased mobility;Cardiopulmonary status limiting activity;Decreased knowledge of precautions;Decreased safety awareness;Decreased knowledge of use of DME       PT Treatment Interventions DME instruction;Gait training;Functional mobility training;Therapeutic activities;Therapeutic exercise;Patient/family education    PT Goals (Current goals can be found in the Care Plan section)  Acute Rehab PT Goals Patient Stated Goal: agreed to getting up. PT Goal Formulation: With patient/family Time For Goal Achievement:  09/28/16 Potential to Achieve Goals: Good    Frequency Min 3X/week   Barriers to discharge Decreased caregiver support      Co-evaluation               End of Session   Activity Tolerance: Patient tolerated treatment well Patient left: in chair;with call bell/phone within reach;with chair alarm set;with family/visitor present Nurse Communication: Mobility status PT Visit Diagnosis: Difficulty in walking, not elsewhere classified (R26.2)    Time: 1410-1433 PT Time Calculation (min) (ACUTE ONLY): 23 min   Charges:   PT Evaluation $PT Eval Low Complexity: 1 Procedure PT Treatments $Therapeutic Activity: 8-22 mins   PT G CodesTresa Endo PT [phone]   Claretha Cooper 09/14/2016, 5:10 PM

## 2016-09-14 NOTE — Progress Notes (Signed)
Initial Nutrition Assessment  DOCUMENTATION CODES:   Not applicable  INTERVENTION:  - Will order Ensure Enlive po BID, each supplement provides 350 kcal and 20 grams of protein - Continue to encourage PO intakes of meals and supplements. - RD will continue to monitor for additional needs.  NUTRITION DIAGNOSIS:   Increased nutrient needs related to wound healing as evidenced by estimated needs.  GOAL:   Patient will meet greater than or equal to 90% of their needs  MONITOR:   PO intake, Supplement acceptance, Weight trends, Labs  REASON FOR ASSESSMENT:   Malnutrition Screening Tool  ASSESSMENT:   76 yr old woman who lives at home with her daughter. She was discharged from this facility on 07/19/2016 after a stay for pneumonia and sepsis. According to the patient's daughter she had been doing well last night when she was sounding a little congested. This morning the patient fell to the floor a short distance from her low bed and was not able to get up. She was confused according to the patient's daughter. She was very weak and required the assistance of two people to get her onto her feet. She had a low grade temperature. EMS was called. They found her to be hypoxic upon their arrival. She also has hx of COPD, HTN, CAD, GERD, Schizoaffective disorder, Stage 4 CKD, and dyslipidemia.  Pt seen for MST. BMI indicates overweight status. Per review, pt has been eating 50-100% of meals since admission. Per rounds, pt was on BiPAP overnight until ~0500 today and since that time has been on [place]. Pt now ordered for PRN BiPAP. On admission, pt was found to be positive for flu B and CXR showed possible L-sided PNA/HCAP.   Physical assessment unable to be done at this time but will be done at follow-up. Per chart review, pt has lost 14 lbs (9% body weight) in the past 4-5 months which is significant for time frame. Unable to state malnutrition based on available information at this time but will  update as warranted at follow-up.   Medications reviewed; 200 mg Colace/day, 75 mcg oral Synthroid/day, daily multivitamin with minerals, 40 mg oral Protonix/day, 17 g Miralax/day, 10 mg Deltasone/day, 50000 units Drisdol every 7 days (first dose: 4/1). Labs reviewed; BUN: 38 mg/dL, creatinine: 1.66 mg/dL, Ca: 8.3 mg/dL, GFR: 29 mL/min.    Diet Order:  Diet Heart Room service appropriate? Yes; Fluid consistency: Thin  Skin:  Wound (see comment) (Stage 2 coccyx pressure injury)  Last BM:  4/2  Height:   Ht Readings from Last 1 Encounters:  09/12/16 5\' 3"  (1.6 m)    Weight:   Wt Readings from Last 1 Encounters:  09/12/16 142 lb (64.4 kg)    Ideal Body Weight:  52.27 kg  BMI:  Body mass index is 25.15 kg/m.  Estimated Nutritional Needs:   Kcal:  1610-1805 (25-28 kcal/kg)  Protein:  65-77 grams (1-1.2 grams/kg)  Fluid:  >/= 1.6 L/day  EDUCATION NEEDS:   No education needs identified at this time    Jarome Matin, MS, RD, LDN, CNSC Inpatient Clinical Dietitian Pager # [phone] After hours/weekend pager # [phone]

## 2016-09-14 NOTE — Progress Notes (Signed)
PROGRESS NOTE  Rebecca [name] CBU:[id number] DOB: 06-Sep-1940 DOA: 09/12/2016 PCP: [name], MD   LOS: 2 days   Brief Narrative: Patient was discharged from this facility on 07/19/2016 after a stay for pneumonia and sepsis. Present signal is respiratory symptoms, influenza B+, chest x-ray suggest left-sided pneumonia, empirically started on IV cefepime and vancomycin for possible HCAP  Assessment & Plan: Active Problems:   Hypothyroidism   Dyslipidemia   Anemia in chronic renal disease   Schizoaffective disorder (HCC)   GASTROESOPHAGEAL REFLUX, NO ESOPHAGITIS   Polymyalgia rheumatica (HCC)   COPD exacerbation (HCC)   MGUS (monoclonal gammopathy of unknown significance)   CKD stage 4, GFR 15-29 ml/min    Coronary artery disease involving native coronary artery of native heart without angina pectoris   Benign essential HTN   Sepsis due to pneumonia (HCC)   Healthcare-associated pneumonia   AKI (acute kidney injury) ([place])   HAP (hospital-acquired pneumonia)   Respiratory distress   Sepsis with hypotension, secondary to influenza B and left-sided pneumonia  -Admitted to the floor, had hypoxic and hypercarbic respiratory failure requiring BiPAP and need to transfer to stepdown immediately after admission -She is slowly improving, keeping stepdown for 1 more day, if she does need any more BiPAP will be able to transfer to the floor tomorrow -Continue IV cefepime and vancomycin for possible HCAP pending cultures. Continue Tamiflu for influenza B. Continue bronchodilators  HTN/CAD - no ACS type symptoms at this time, continue Toprol-XL, pravastatin, isosorbide, aspirin and Plavix   Generalized weakness /history of polymyalgia rheumatica/MGUS -continue prednisone 10 mg daily , get physical therapy evaluation once respiratory status improves further due to #2  CKD stage III - IV - creatinine appears to be at or below her recent baseline, now ~ 1.6  Hypothyroidism -Continue  Synthroid   DVT prophylaxis: heparin Code Status: Full code Family Communication: Discussed with daughter over the phone Disposition Plan: Remain in stepdown, home when ready  Consultants:   None   Procedures:   none  Antimicrobials:  Vancomycin 4/1 >>  Cefepime 4/1 >>  Subjective: -Feels a little better, breathing better.  Denies any chest pain, no abdominal pain, nausea or vomiting  Objective: Vitals:   09/14/16 0700 09/14/16 0800 09/14/16 0806 09/14/16 0842  BP: (!) 176/73 (!) 183/78    Pulse: 63 66  64  Resp: 16 13  16   Temp:   99 F (37.2 C)   TempSrc:   Oral   SpO2: 97% 97%  97%  Weight:      Height:        Intake/Output Summary (Last 24 hours) at 09/14/16 1127 Last data filed at 09/14/16 0521  Gross per 24 hour  Intake             1010 ml  Output             1400 ml  Net             -390 ml   Filed Weights   09/12/16 1224  Weight: 64.4 kg (142 lb)    Examination: Constitutional: NAD Vitals:   09/14/16 0700 09/14/16 0800 09/14/16 0806 09/14/16 0842  BP: (!) 176/73 (!) 183/78    Pulse: 63 66  64  Resp: 16 13  16   Temp:   99 F (37.2 C)   TempSrc:   Oral   SpO2: 97% 97%  97%  Weight:      Height:       Eyes: PERRL, lids  and conjunctivae normal ENMT: Mucous membranes are moist. No oropharyngeal exudates Neck: normal, supple, no masses Respiratory: clear to auscultation bilaterally, no wheezing, no crackles. Normal respiratory effort. No accessory muscle use.  Cardiovascular: Regular rate and rhythm, no murmurs / rubs / gallops. No LE edema.  Abdomen: no tenderness. Bowel sounds positive.   Skin: no rashes, lesions, ulcers. No induration Neurologic: non focal   Psychiatric: Normal judgment and insight. Alert and oriented x 3. Normal mood.    Data Reviewed: I have personally reviewed following labs and imaging studies  CBC:  Recent Labs Lab 09/12/16 1307 09/13/16 0348  WBC 13.6* 9.6  NEUTROABS 12.0* 6.9  HGB 11.1* 10.2*  HCT  34.4* 30.8*  MCV 94.0 93.3  PLT 168 937*   Basic Metabolic Panel:  Recent Labs Lab 09/12/16 1307 09/13/16 0348  NA 138 140  K 4.7 4.3  CL 104 110  CO2 26 23  GLUCOSE 196* 81  BUN 38* 38*  CREATININE 2.01* 1.66*  CALCIUM 9.8 8.3*   GFR: Estimated Creatinine Clearance: 25.6 mL/min (A) (by C-G formula based on SCr of 1.66 mg/dL (H)). Liver Function Tests:  Recent Labs Lab 09/12/16 1307  AST 25  ALT 14  ALKPHOS 38  BILITOT 0.7  PROT 6.8  ALBUMIN 3.6   No results for input(s): LIPASE, AMYLASE in the last 168 hours. No results for input(s): AMMONIA in the last 168 hours. Coagulation Profile: No results for input(s): INR, PROTIME in the last 168 hours. Cardiac Enzymes:  Recent Labs Lab 09/12/16 1307  CKTOTAL 151   BNP (last 3 results) No results for input(s): PROBNP in the last 8760 hours. HbA1C: No results for input(s): HGBA1C in the last 72 hours. CBG:  Recent Labs Lab 09/12/16 2033 09/13/16 1719  GLUCAP 113* 174*   Lipid Profile: No results for input(s): CHOL, [name], [name], [name], [name], LDLDIRECT in the last 72 hours. Thyroid Function Tests: No results for input(s): TSH, T4TOTAL, FREET4, T3FREE, THYROIDAB in the last 72 hours. Anemia Panel: No results for input(s): VITAMINB12, FOLATE, FERRITIN, TIBC, IRON, RETICCTPCT in the last 72 hours. Urine analysis:    Component Value Date/Time   COLORURINE YELLOW 09/12/2016 1247   APPEARANCEUR HAZY (A) 09/12/2016 1247   LABSPEC 1.014 09/12/2016 1247   LABSPEC 1.015 01/06/2015 1542   PHURINE 5.0 09/12/2016 1247   GLUCOSEU NEGATIVE 09/12/2016 1247   GLUCOSEU Negative 01/06/2015 1542   HGBUR NEGATIVE 09/12/2016 1247   HGBUR negative 12/03/2008 1113   BILIRUBINUR NEGATIVE 09/12/2016 1247   BILIRUBINUR Negative 01/06/2015 1542   KETONESUR NEGATIVE 09/12/2016 1247   PROTEINUR 30 (A) 09/12/2016 1247   UROBILINOGEN 0.2 01/06/2015 1542   NITRITE NEGATIVE 09/12/2016 1247   LEUKOCYTESUR NEGATIVE 09/12/2016 1247    LEUKOCYTESUR Negative 01/06/2015 1542   Sepsis Labs: Invalid input(s): PROCALCITONIN, LACTICIDVEN  Recent Results (from the past 240 hour(s))  Urine culture     Status: Abnormal (Preliminary result)   Collection Time: 09/12/16 12:47 PM  Result Value Ref Range Status   Specimen Description URINE, RANDOM  Final   Special Requests NONE  Final   Culture >=100,000 COLONIES/mL ESCHERICHIA COLI (A)  Final   Report Status PENDING  Incomplete  Blood Culture (routine x 2)     Status: None (Preliminary result)   Collection Time: 09/12/16  1:07 PM  Result Value Ref Range Status   Specimen Description BLOOD RIGHT ANTECUBITAL  Final   Special Requests   Final    BOTTLES DRAWN AEROBIC AND ANAEROBIC Blood Culture adequate  volume   Culture   Final    NO GROWTH < 12 HOURS Performed at [facility], [place] [address] [place], [place] [address]    Report Status PENDING  Incomplete  Blood Culture (routine x 2)     Status: None (Preliminary result)   Collection Time: 09/12/16  2:30 PM  Result Value Ref Range Status   Specimen Description BLOOD LEFT WRIST  Final   Special Requests IN PEDIATRIC BOTTLE Blood Culture adequate volume  Final   Culture   Final    NO GROWTH < 12 HOURS Performed at [facility], [place] [address] [place], [place] [address]    Report Status PENDING  Incomplete  MRSA PCR Screening     Status: None   Collection Time: 09/12/16 10:59 PM  Result Value Ref Range Status   MRSA by PCR NEGATIVE NEGATIVE Final    Comment:        The GeneXpert MRSA Assay (FDA approved for NASAL specimens only), is one component of a comprehensive MRSA colonization surveillance program. It is not intended to diagnose MRSA infection nor to guide or monitor treatment for MRSA infections.       Radiology Studies: Dg Chest 1 View  Result Date: 09/12/2016 CLINICAL DATA:  Productive cough.  Fell last night. EXAM: CHEST 1 VIEW COMPARISON:  07/14/2016. FINDINGS: Borderline enlarged  cardiac silhouette. Aortic calcification. Stable linear densities at the left lung base and small amount of left pleural thickening or fluid. Lower thoracic spine and bilateral shoulder degenerative changes. IMPRESSION: 1. Stable left basilar linear atelectasis or scarring and small amount of left pleural thickening or fluid. 2. Aortic atherosclerosis. Electronically Signed   By: Claudie Revering M.D.   On: 09/12/2016 14:16   Dg Pelvis 1-2 Views  Result Date: 09/12/2016 CLINICAL DATA:  Pelvic pain following a fall last night. EXAM: PELVIS - 1-2 VIEW COMPARISON:  Pelvis CT dated 09/25/2015 FINDINGS: No pelvic fracture or dislocation seen. Lower lumbar spine degenerative changes. IMPRESSION: No fracture. Electronically Signed   By: Claudie Revering M.D.   On: 09/12/2016 14:18   Ct Head Wo Contrast  Result Date: 09/12/2016 CLINICAL DATA:  Per EMS, pt is coming from home after experiencing a fall at home last night. Pt lives with her daughter and daughter stated pt did not hit her head when fall occurred. Daughter reports that pt had a fever and a productive cough last night and had increased weakness after the fall occurred. Pt usually ambulates independently but now requires assistance to get up out of the bed. Pt AO x4. EXAM: CT HEAD WITHOUT CONTRAST CT CERVICAL SPINE WITHOUT CONTRAST TECHNIQUE: Multidetector CT imaging of the head and cervical spine was performed following the standard protocol without intravenous contrast. Multiplanar CT image reconstructions of the cervical spine were also generated. COMPARISON:  07/14/2016 FINDINGS: CT HEAD FINDINGS Brain: No evidence of acute infarction, hemorrhage, hydrocephalus, extra-axial collection or mass lesion/mass effect. Age related volume loss. Mild chronic microvascular ischemic change. 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 Stable appearance from the prior exam. Vascular: No hyperdense vessel or unexpected calcification. Skull: Normal. Negative for fracture or  focal lesion. Sinuses/Orbits: Visualized globes and orbits are unremarkable. Visualized sinuses and mastoid air cells are clear. Other: None. CT CERVICAL SPINE FINDINGS Alignment: Mild reversal of the normal cervical lordosis, apex at C4. No spondylolisthesis. Skull base and vertebrae: No acute fracture. No primary bone lesion or focal pathologic process. Soft tissues and spinal canal: No prevertebral fluid or  swelling. No visible canal hematoma. Disc levels: Mild loss of disc height from C3-C4 through C5-C6. Endplate spurring noted from C4 through C7. Facet degenerative change noted most prominent on the right at C2-C3. No disc herniations. Upper chest: No masses.  No acute findings. Other: None. IMPRESSION: HEAD CT:  No acute intracranial abnormalities.  No skull fracture. CERVICAL CT:  No fracture or acute finding. Electronically Signed   By: Lajean Manes M.D.   On: 09/12/2016 14:29   Ct Cervical Spine Wo Contrast  Result Date: 09/12/2016 CLINICAL DATA:  Per EMS, pt is coming from home after experiencing a fall at home last night. Pt lives with her daughter and daughter stated pt did not hit her head when fall occurred. Daughter reports that pt had a fever and a productive cough last night and had increased weakness after the fall occurred. Pt usually ambulates independently but now requires assistance to get up out of the bed. Pt AO x4. EXAM: CT HEAD WITHOUT CONTRAST CT CERVICAL SPINE WITHOUT CONTRAST TECHNIQUE: Multidetector CT imaging of the head and cervical spine was performed following the standard protocol without intravenous contrast. Multiplanar CT image reconstructions of the cervical spine were also generated. COMPARISON:  07/14/2016 FINDINGS: CT HEAD FINDINGS Brain: No evidence of acute infarction, hemorrhage, hydrocephalus, extra-axial collection or mass lesion/mass effect. Age related volume loss. Mild chronic microvascular ischemic change. 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 Stable  appearance from the prior exam. Vascular: No hyperdense vessel or unexpected calcification. Skull: Normal. Negative for fracture or focal lesion. Sinuses/Orbits: Visualized globes and orbits are unremarkable. Visualized sinuses and mastoid air cells are clear. Other: None. CT CERVICAL SPINE FINDINGS Alignment: Mild reversal of the normal cervical lordosis, apex at C4. No spondylolisthesis. Skull base and vertebrae: No acute fracture. No primary bone lesion or focal pathologic process. Soft tissues and spinal canal: No prevertebral fluid or swelling. No visible canal hematoma. Disc levels: Mild loss of disc height from C3-C4 through C5-C6. Endplate spurring noted from C4 through C7. Facet degenerative change noted most prominent on the right at C2-C3. No disc herniations. Upper chest: No masses.  No acute findings. Other: None. IMPRESSION: HEAD CT:  No acute intracranial abnormalities.  No skull fracture. CERVICAL CT:  No fracture or acute finding. Electronically Signed   By: Lajean Manes M.D.   On: 09/12/2016 14:29   Dg Chest Port 1 View  Result Date: 09/13/2016 CLINICAL DATA:  Abnormal breathing sounds. History of COPD, CHF, diabetes. EXAM: PORTABLE CHEST 1 VIEW COMPARISON:  Chest radiograph September 12, 2016 FINDINGS: The cardiac silhouette is mildly enlarged unchanged. Calcified aortic knob. Similar patchy LEFT lung base airspace opacity, blunted costophrenic angle. RIGHT lung is clear. No pneumothorax. Soft tissue planes and included osseous structures are nonsuspicious. IMPRESSION: Stable LEFT basilar airspace opacity with small LEFT pleural effusion versus pleural thickening. Electronically Signed   By: Elon Alas M.D.   On: 09/13/2016 01:58     Scheduled Meds: . albuterol  2.5 mg Nebulization TID  . aspirin EC  81 mg Oral Daily  . ceFEPime (MAXIPIME) IV  1 g Intravenous Q24H  . clopidogrel  75 mg Oral QHS  . clozapine  300 mg Oral QHS  . diltiazem  180 mg Oral Q breakfast  . docusate sodium   200 mg Oral QHS  . heparin  5,000 Units Subcutaneous Q8H  . hydrALAZINE  10 mg Oral Q8H  . isosorbide mononitrate  60 mg Oral Daily  . levothyroxine  75 mcg Oral  QAC breakfast  . loratadine  10 mg Oral Daily  . mouth rinse  15 mL Mouth Rinse BID  . metoprolol succinate  100 mg Oral Q breakfast  . mometasone-formoterol  2 puff Inhalation BID  . multivitamin with minerals  1 tablet Oral QHS  . oseltamivir  30 mg Oral Q24H  . pantoprazole  40 mg Oral Daily  . polyethylene glycol  17 g Oral Daily  . pravastatin  20 mg Oral QHS  . predniSONE  10 mg Oral Q breakfast  . tiotropium  18 mcg Inhalation Daily  . traMADol  100 mg Oral TID  . vancomycin  750 mg Intravenous Q24H  . Vitamin D (Ergocalciferol)  50,000 Units Oral Q7 days   Continuous Infusions:  Marzetta Board, MD, PhD [facility] Pager [phone]  If 7PM-7AM, please contact night-coverage [email or web address] Password [facility] 09/14/2016, 11:27 AM

## 2016-09-15 ENCOUNTER — Inpatient Hospital Stay (HOSPITAL_COMMUNITY): Payer: Medicare Other

## 2016-09-15 DIAGNOSIS — J9601 Acute respiratory failure with hypoxia: Secondary | ICD-10-CM

## 2016-09-15 DIAGNOSIS — J9602 Acute respiratory failure with hypercapnia: Secondary | ICD-10-CM

## 2016-09-15 LAB — COMPREHENSIVE METABOLIC PANEL
ALBUMIN: 3 g/dL — AB (ref 3.5–5.0)
ALT: 16 U/L (ref 14–54)
AST: 23 U/L (ref 15–41)
Alkaline Phosphatase: 29 U/L — ABNORMAL LOW (ref 38–126)
Anion gap: 6 (ref 5–15)
BILIRUBIN TOTAL: 0.6 mg/dL (ref 0.3–1.2)
BUN: 31 mg/dL — AB (ref 6–20)
CHLORIDE: 107 mmol/L (ref 101–111)
CO2: 25 mmol/L (ref 22–32)
CREATININE: 1.5 mg/dL — AB (ref 0.44–1.00)
Calcium: 8.7 mg/dL — ABNORMAL LOW (ref 8.9–10.3)
GFR calc Af Amer: 38 mL/min — ABNORMAL LOW (ref >60)
GFR, EST NON AFRICAN AMERICAN: 32 mL/min — AB (ref >60)
GLUCOSE: 93 mg/dL (ref 65–99)
POTASSIUM: 4.7 mmol/L (ref 3.5–5.1)
Sodium: 138 mmol/L (ref 135–145)
TOTAL PROTEIN: 5.9 g/dL — AB (ref 6.5–8.1)

## 2016-09-15 LAB — CBC
HEMATOCRIT: 28 % — AB (ref 36.0–46.0)
Hemoglobin: 9.4 g/dL — ABNORMAL LOW (ref 12.0–15.0)
MCH: 29.8 pg (ref 26.0–34.0)
MCHC: 33.6 g/dL (ref 30.0–36.0)
MCV: 88.9 fL (ref 78.0–100.0)
PLATELETS: 129 10*3/uL — AB (ref 150–400)
RBC: 3.15 MIL/uL — ABNORMAL LOW (ref 3.87–5.11)
RDW: 13.6 % (ref 11.5–15.5)
WBC: 5.5 10*3/uL (ref 4.0–10.5)

## 2016-09-15 LAB — BLOOD GAS, ARTERIAL
ACID-BASE DEFICIT: 0.9 mmol/L (ref 0.0–2.0)
Bicarbonate: 22.9 mmol/L (ref 20.0–28.0)
Drawn by: 441261
FIO2: 21
O2 SAT: 88.7 %
PCO2 ART: 36.7 mmHg (ref 32.0–48.0)
PO2 ART: 56.9 mmHg — AB (ref 83.0–108.0)
Patient temperature: 98.6
pH, Arterial: 7.412 (ref 7.350–7.450)

## 2016-09-15 LAB — URINE CULTURE

## 2016-09-15 MED ORDER — TRAMADOL HCL 50 MG PO TABS: 50.0000 mg | ORAL_TABLET | Freq: Two times a day (BID) | ORAL | Status: DC | PRN

## 2016-09-15 MED ORDER — TRAMADOL HCL 50 MG PO TABS
50.0000 mg | ORAL_TABLET | Freq: Two times a day (BID) | ORAL | Status: DC | PRN
  Filled 2016-09-15: qty 1

## 2016-09-15 MED ORDER — TRAMADOL HCL 50 MG PO TABS
50.0000 mg | ORAL_TABLET | Freq: Three times a day (TID) | ORAL | Status: DC
  Administered 2016-09-15 – 2016-09-16 (×4): 50 mg via ORAL
  Filled 2016-09-15 (×3): qty 1

## 2016-09-15 MED ORDER — TEMAZEPAM 7.5 MG PO CAPS: 7.5000 mg | ORAL_CAPSULE | Freq: Every evening | ORAL | Status: DC | PRN

## 2016-09-15 NOTE — Progress Notes (Signed)
Pharmacy Antibiotic Note  Rebecca [name] is a 76 y.o. female with PMH CAD, CKD 4, COPD, DM, CHF, MGUS, CVA, polymyalgia rheumatica; presenting 09/12/2016 after fall last night; reports productive cough and fever with weakness. Admitting for sepsis d/t HCAP.  Pharmacy consulted for vancomycin and cefepime dosing on 4/1. Now with Ecoli UTI  Plan: 1) MRSA PCR Negative - vanc d/c'd 2) Continue Cefepime 1g q24 3) Narrow abx's and/or change to PO when clinically appropriate 4) Day 4/5 Tamiflu for positive flu   Height: 5\' 3"  (160 cm) Weight: 142 lb (64.4 kg) IBW/kg (Calculated) : 52.4  Temp (24hrs), Avg:98.3 F (36.8 C), Min:97.6 F (36.4 C), Max:99.2 F (37.3 C)   Recent Labs Lab 09/12/16 1307 09/12/16 1313 09/13/16 0348 09/15/16 0340  WBC 13.6*  --  9.6 5.5  CREATININE 2.01*  --  1.66* 1.50*  LATICACIDVEN  --  0.98  --   --     Estimated Creatinine Clearance: 28.4 mL/min (A) (by C-G formula based on SCr of 1.5 mg/dL (H)).    Allergies  Allergen Reactions  . Levaquin [Levofloxacin] Other (See Comments)    Ruptured tendon  . Tape Other (See Comments)    PATIENT'S SKIN IS VERY, VERY THIN AND WILL TEAR AND BRUISE EASILY; PLEASE USE AN ALTERNATIVE (SOMETHING OTHER THAN TAPE)    Antimicrobials this admission: vancomycin 4/1 >> 4/4 cefepime 4/1 >>   Dose adjustments this admission: ---  Microbiology results: 4/1 BCx: sent 4/1 UCx: sent    Thank you for allowing pharmacy to be a part of this patient's care.  Reuel Boom, PharmD, BCPS Pager: [phone] 09/15/2016, 9:53 AM

## 2016-09-15 NOTE — NC FL2 (Signed)
[place] MEDICAID FL2 LEVEL OF CARE SCREENING TOOL     IDENTIFICATION  Patient Name: Rebecca [name] Birthdate: December 20, 1940 Sex: female Admission Date (Current Location): 09/12/2016  [facility] and [place] Number:  Herbalist and Address:  [facility],  [place] [address] [place]      Provider Number: [id number]  Attending Physician Name and Address:  Caren Griffins, MD  Relative Name and Phone Number:       Current Level of Care: Hospital Recommended Level of Care: [place] Prior Approval Number:    Date Approved/Denied:   PASRR Number:    Discharge Plan: SNF    Current Diagnoses: Patient Active Problem List   Diagnosis Date Noted  . Healthcare-associated pneumonia 09/12/2016  . AKI (acute kidney injury) ([place]) 09/12/2016  . HAP (hospital-acquired pneumonia) 09/12/2016  . Respiratory distress 09/12/2016  . Pressure injury of skin 07/15/2016  . Benign essential HTN 07/14/2016  . Sepsis due to pneumonia ([place]) 07/14/2016  . Acute encephalopathy 07/14/2016  . Coronary artery disease involving native coronary artery of native heart without angina pectoris 05/14/2016  . CKD stage 4, GFR 15-29 ml/min  08/11/2015  . 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 Leukocytosis 01/28/2014  . History of TIA - May 2015- Plavix 10/16/2013  . COPD exacerbation ([place]) 07/16/2013  . Polymyalgia rheumatica ([place]) 10/03/2012  . Trochanteric bursitis of right hip 04/11/2012  . History of small bowel obstruction 11/04/2010  . Schizoaffective disorder ([place]) 01/10/2008  . Dyslipidemia 01/25/2007  . Hypothyroidism 08/11/2006  . Anemia in chronic renal disease 08/11/2006  . GASTROESOPHAGEAL REFLUX, NO ESOPHAGITIS 08/11/2006    Orientation RESPIRATION BLADDER Height & Weight     Self, Time, Situation, Place  O2 Continent Weight: 142 lb (64.4 kg) Height:  5\' 3"  (160 cm)  BEHAVIORAL SYMPTOMS/MOOD NEUROLOGICAL BOWEL  NUTRITION STATUS   (no behaviors)   Continent Diet (heart healthy)  AMBULATORY STATUS COMMUNICATION OF NEEDS Skin   Limited Assist Verbally Surgical wounds (pressure injury stage 2)                       Personal Care Assistance Level of Assistance  Bathing, Dressing Bathing Assistance: Limited assistance   Dressing Assistance: Limited assistance     Functional Limitations Info   (none)          SPECIAL CARE FACTORS FREQUENCY  PT (By licensed PT), OT (By licensed OT)     PT Frequency: 5xweek OT Frequency: 5xweek            Contractures Contractures Info: Not present    Additional Factors Info  Code Status, Allergies, Psychotropic Code Status Info: full code Allergies Info: levaquin, tape Psychotropic Info:  (clozaril 300mg  QHS)         Current Medications (09/15/2016):  This is the current hospital active medication list Current Facility-Administered Medications  Medication Dose Route Frequency Provider Last Rate Last Dose  . acetaminophen (TYLENOL) tablet 650 mg  650 mg Oral Q6H PRN Ava Swayze, DO   650 mg at 09/15/16 1146   Or  . acetaminophen (TYLENOL) suppository 650 mg  650 mg Rectal Q6H PRN Ava Swayze, DO      . albuterol (PROVENTIL) (2.5 MG/3ML) 0.083% nebulizer solution 2.5 mg  2.5 mg Nebulization Q6H PRN Ava Swayze, DO      . albuterol (PROVENTIL) (2.5 MG/3ML) 0.083% nebulizer solution 2.5 mg  2.5 mg Nebulization TID Ava Swayze, DO   2.5  mg at 09/14/16 1955  . aspirin EC tablet 81 mg  81 mg Oral Daily Ava Swayze, DO   81 mg at 09/15/16 1100  . bisacodyl (DULCOLAX) suppository 10 mg  10 mg Rectal Daily PRN Ava Swayze, DO      . ceFEPIme (MAXIPIME) 1 g in dextrose 5 % 50 mL IVPB  1 g Intravenous Q24H Ava Swayze, DO   1 g at 09/14/16 1331  . clopidogrel (PLAVIX) tablet 75 mg  75 mg Oral QHS Ava Swayze, DO   75 mg at 09/14/16 2113  . cloZAPine (CLOZARIL) tablet 300 mg  300 mg Oral QHS Ava Swayze, DO   300 mg at 09/14/16 2113  . diltiazem (CARDIZEM LA) 24  hr tablet 180 mg  180 mg Oral Q breakfast Ava Swayze, DO   180 mg at 09/15/16 0826  . docusate sodium (COLACE) capsule 200 mg  200 mg Oral QHS Ava Swayze, DO   200 mg at 09/14/16 2110  . feeding supplement (ENSURE ENLIVE) (ENSURE ENLIVE) liquid 237 mL  237 mL Oral BID BM Costin Karlyne Greenspan, MD   237 mL at 09/15/16 1000  . heparin injection 5,000 Units  5,000 Units Subcutaneous Q8H Ava Swayze, DO   5,000 Units at 09/15/16 0509  . hydrALAZINE (APRESOLINE) injection 5 mg  5 mg Intravenous Q4H PRN Caren Griffins, MD   5 mg at 09/15/16 0506  . hydrALAZINE (APRESOLINE) tablet 10 mg  10 mg Oral Q8H Costin Karlyne Greenspan, MD   10 mg at 09/15/16 0518  . isosorbide mononitrate (IMDUR) 24 hr tablet 60 mg  60 mg Oral Daily Ava Swayze, DO   60 mg at 09/15/16 1100  . levothyroxine (SYNTHROID, LEVOTHROID) tablet 75 mcg  75 mcg Oral QAC breakfast Ava Swayze, DO   75 mcg at 09/15/16 0826  . loratadine (CLARITIN) tablet 10 mg  10 mg Oral Daily Ava Swayze, DO   10 mg at 09/15/16 1100  . magnesium citrate solution 1 Bottle  1 Bottle Oral Once PRN Ava Swayze, DO      . MEDLINE mouth rinse  15 mL Mouth Rinse BID Roxan Hockey, MD   15 mL at 09/15/16 1000  . metoprolol succinate (TOPROL-XL) 24 hr tablet 100 mg  100 mg Oral Q breakfast Ava Swayze, DO   100 mg at 09/15/16 0825  . mometasone-formoterol (DULERA) 200-5 MCG/ACT inhaler 2 puff  2 puff Inhalation BID Ava Swayze, DO   2 puff at 09/14/16 1955  . multivitamin with minerals tablet 1 tablet  1 tablet Oral QHS Ava Swayze, DO   1 tablet at 09/14/16 2111  . nitroGLYCERIN (NITROSTAT) SL tablet 0.4 mg  0.4 mg Sublingual Q5 min PRN Ava Swayze, DO      . oseltamivir (TAMIFLU) capsule 30 mg  30 mg Oral Q24H Rhetta Mura Schorr, NP   30 mg at 09/14/16 2110  . pantoprazole (PROTONIX) EC tablet 40 mg  40 mg Oral Daily Ava Swayze, DO   40 mg at 09/15/16 1100  . polyethylene glycol (MIRALAX / GLYCOLAX) packet 17 g  17 g Oral Daily Ava Swayze, DO   17 g at 09/15/16 1100  . pravastatin  (PRAVACHOL) tablet 20 mg  20 mg Oral QHS Ava Swayze, DO   20 mg at 09/14/16 2110  . predniSONE (DELTASONE) tablet 10 mg  10 mg Oral Q breakfast Ava Swayze, DO   10 mg at 09/15/16 0825  . senna-docusate (Senokot-S) tablet 1 tablet  1 tablet Oral QHS  PRN Ava Swayze, DO      . temazepam (RESTORIL) capsule 7.5 mg  7.5 mg Oral QHS PRN Caren Griffins, MD      . tiotropium [facility]) inhalation capsule 18 mcg  18 mcg Inhalation Daily Ava Swayze, DO   18 mcg at 09/14/16 0844  . traMADol (ULTRAM) tablet 50 mg  50 mg Oral Q12H PRN Costin Karlyne Greenspan, MD      . traZODone (DESYREL) tablet 25 mg  25 mg Oral QHS PRN Costin Karlyne Greenspan, MD      . Vitamin D (Ergocalciferol) (DRISDOL) capsule 50,000 Units  50,000 Units Oral Q7 days Ava Swayze, DO   50,000 Units at 09/12/16 2108     Discharge Medications: Please see discharge summary for a list of discharge medications.  Relevant Imaging Results:  Relevant Lab Results:   Additional Information  [facility] [id number], oncology appt 10:30am 09/27/16 at [facility])  [place], [place]

## 2016-09-15 NOTE — Clinical Social Work Note (Signed)
Clinical Social Work Assessment  Patient Details  Name: Rebecca [name] MRN: [id number] Date of Birth: 1940/10/26  Date of referral:  09/15/16               Reason for consult:  Facility Placement, Discharge Planning                Permission sought to share information with:  Facility Sport and exercise psychologist, Family Supports Permission granted to share information::  Yes, Verbal Permission Granted  Name::        Agency::   (SNF representatives for referral purposes)  Relationship::  Daughter Zauria Dombek [phone], cell [phone]  Contact Information:     Housing/Transportation Living arrangements for the past 2 months:  Single Family Home Source of Information:  Patient, Adult Children Patient Interpreter Needed:  None Criminal Activity/Legal Involvement Pertinent to Current Situation/Hospitalization:  No - Comment as needed Significant Relationships:  Adult Children, Other Family Members, Warehouse manager (grandchildren, adult enrichment center staff and participants) Lives with:  Adult Children (daughter above) Do you feel safe going back to the place where you live?  Yes Need for family participation in patient care:  Yes (Comment)  Care giving concerns:  Pt from home, resides with daughter, at baseline ambulates/ performs ADLs independently but uses cane/walker in public due to fatigue. Daughter and family assist with care decisions. Pt anticipates returning to daughter's home once appropriate   Social Worker assessment / plan: pt is a 76 yr old female admitted to hospital 09/12/16 due to pneumonia and recent fall. PT eval recommends SNF at d/c.  CSW met with pt at bedside, who advised daughter (contact # above) assists her in decision making. CSW spoke with daughter via phone. Explained role and reason for consult. Pt/daughter agreeable to SNF referrals. Daughter reports pt was in SNF several years ago and had positive experience. Pt resides with daughter and hopes to return once  able. CSW completed FL2 and PASSR- awaiting PASSR authorization. Submitted referrals to area SNFs. Will continue following, awaiting response to referrals.   Employment status:  Retired Forensic scientist:  [employer] PT Recommendations:  [place] / Referral to community resources:  [place]  Patient/Family's Response to care:  Patient and daughter respond positively to plan for SNF at d/c  Patient/Family's Understanding of and Emotional Response to Diagnosis, Current Treatment, and Prognosis:  Patient expressed some apprehension about not being able to attend her program at [facility] for the days she is in SNF. Daughter assisted in reassuring pt that SNF recommended for ST and that daughter will speak to program to ensure pt can resume program once home. Pt understanding. Both pt and daughter optimistic about plan. Daughter states, "We do not want anything long term, we want Mama to live with [place] till the end of her life."  Emotional Assessment Appearance:  Appears stated age Attitude/Demeanor/Rapport:   (pleasant) Affect (typically observed):  Calm, Accepting, Hopeful Orientation:  Oriented to Self, Oriented to Place, Oriented to  Time, Oriented to Situation Alcohol / Substance use:  Not Applicable Psych involvement (Current and /or in the community):  Outpatient Provider (Dr. Casimiro Needle)  Discharge Needs  Concerns to be addressed:  Discharge Planning Concerns (pt in need of skilled nursing for PT) Readmission within the last 30 days:  No Current discharge risk:  Dependent with Mobility Barriers to Discharge:  No Barriers Identified   Nila Nephew, [place] 09/15/2016, 3:04 PM

## 2016-09-15 NOTE — Progress Notes (Signed)
PROGRESS NOTE  Rebecca [name] XTG:[id number] DOB: [birth year] DOA: 09/12/2016 PCP: [name], MD   LOS: 3 days   Brief Narrative: Patient was discharged from this facility on 07/19/2016 after a stay for pneumonia and sepsis. Present signal is respiratory symptoms, influenza B+, chest x-ray suggest left-sided pneumonia, empirically started on IV cefepime and vancomycin for possible HCAP  Assessment & Plan: Active Problems:   Hypothyroidism   Dyslipidemia   Anemia in chronic renal disease   Schizoaffective disorder (HCC)   GASTROESOPHAGEAL REFLUX, NO ESOPHAGITIS   Polymyalgia rheumatica (HCC)   COPD exacerbation (HCC)   MGUS (monoclonal gammopathy of unknown significance)   CKD stage 4, GFR 15-29 ml/min    Coronary artery disease involving native coronary artery of native heart without angina pectoris   Benign essential HTN   Sepsis due to pneumonia (HCC)   Healthcare-associated pneumonia   AKI (acute kidney injury) ([place])   HAP (hospital-acquired pneumonia)   Respiratory distress   Sepsis with hypotension, secondary to influenza B and left-sided pneumonia  -Admitted to the floor, had hypoxic and hypercarbic respiratory failure requiring BiPAP and need to transfer to stepdown immediately after admission -She is slowly improving, has not needed to use BiPAP overnight -Continue IV cefepime, discontinue vancomycin since MRSA was negative.  Continue Tamiflu for influenza, today's date 4/5 -She was a bit drowsy this morning, repeat a chest x-ray as well as obtain an ABG to ensure will need to use the BiPAP again  Acute hypoxic and hypercarbic respiratory failure -Repeat chest x-ray today, she did not take BiPAP overnight, and she is comfortable on room air this morning, however quite drowsy, will repeat an ABG   Drowsiness -Likely in the setting of #1, I am somewhat concerned about her home regimen which includes Restoril, high-dose tramadol, and trazodone, will decrease the dosage in  half today  HTN/CAD - no ACS type symptoms at this time, continue Toprol-XL, pravastatin, isosorbide, aspirin and Plavix   Generalized weakness /history of polymyalgia rheumatica/MGUS -continue prednisone 10 mg daily, get physical therapy evaluation once respiratory status improves further due to #2  CKD stage III - IV - creatinine appears to be at or below her recent baseline, now ~ 1.6  Hypothyroidism -Continue Synthroid   DVT prophylaxis: heparin Code Status: Full code Family Communication: Discussed with daughter over the phone Disposition Plan: Remain in stepdown, potential transfer to the floor later today based on the chest x-ray and ABG results  Consultants:   None   Procedures:   none  Antimicrobials:  Vancomycin 4/1 >>/4  Cefepime 4/1 >>  Subjective: -Feels a little better, breathing better, however appears drowsy on exam  Objective: Vitals:   09/15/16 0518 09/15/16 0600 09/15/16 0700 09/15/16 0825  BP: (!) 180/64 (!) 172/60 (!) 175/65 (!) 176/59  Pulse:  66 66 65  Resp:  17 16   Temp:   99.2 F (37.3 C)   TempSrc:   Oral   SpO2:  95% 94%   Weight:      Height:        Intake/Output Summary (Last 24 hours) at 09/15/16 1051 Last data filed at 09/15/16 0500  Gross per 24 hour  Intake              440 ml  Output             1550 ml  Net            -1110 ml   Filed Weights   09/12/16  1224  Weight: 64.4 kg (142 lb)    Examination: Constitutional: NAD Vitals:   09/15/16 0518 09/15/16 0600 09/15/16 0700 09/15/16 0825  BP: (!) 180/64 (!) 172/60 (!) 175/65 (!) 176/59  Pulse:  66 66 65  Resp:  17 16   Temp:   99.2 F (37.3 C)   TempSrc:   Oral   SpO2:  95% 94%   Weight:      Height:       Eyes: PERRL, lids and conjunctivae normal ENMT: Mucous membranes are moist. No oropharyngeal exudates Neck: normal, supple, no masses Respiratory: clear to auscultation bilaterally, no wheezing, no crackles. Normal respiratory effort. No accessory  muscle use.  Cardiovascular: Regular rate and rhythm, no murmurs / rubs / gallops. No LE edema.  Abdomen: no tenderness. Bowel sounds positive.   Skin: no rashes, lesions, ulcers. No induration Neurologic: non focal   Psychiatric: Normal judgment and insight. Alert and oriented x 3. Normal mood.    Data Reviewed: I have personally reviewed following labs and imaging studies  CBC:  Recent Labs Lab 09/12/16 1307 09/13/16 0348 09/15/16 0340  WBC 13.6* 9.6 5.5  NEUTROABS 12.0* 6.9  --   HGB 11.1* 10.2* 9.4*  HCT 34.4* 30.8* 28.0*  MCV 94.0 93.3 88.9  PLT 168 135* 235*   Basic Metabolic Panel:  Recent Labs Lab 09/12/16 1307 09/13/16 0348 09/15/16 0340  NA 138 140 138  K 4.7 4.3 4.7  CL 104 110 107  CO2 26 23 25   GLUCOSE 196* 81 93  BUN 38* 38* 31*  CREATININE 2.01* 1.66* 1.50*  CALCIUM 9.8 8.3* 8.7*   GFR: Estimated Creatinine Clearance: 28.4 mL/min (A) (by C-G formula based on SCr of 1.5 mg/dL (H)). Liver Function Tests:  Recent Labs Lab 09/12/16 1307 09/15/16 0340  AST 25 23  ALT 14 16  ALKPHOS 38 29*  BILITOT 0.7 0.6  PROT 6.8 5.9*  ALBUMIN 3.6 3.0*   No results for input(s): LIPASE, AMYLASE in the last 168 hours. No results for input(s): AMMONIA in the last 168 hours. Coagulation Profile: No results for input(s): INR, PROTIME in the last 168 hours. Cardiac Enzymes:  Recent Labs Lab 09/12/16 1307  CKTOTAL 151   BNP (last 3 results) No results for input(s): PROBNP in the last 8760 hours. HbA1C: No results for input(s): HGBA1C in the last 72 hours. CBG:  Recent Labs Lab 09/12/16 2033 09/13/16 1719  GLUCAP 113* 174*   Lipid Profile: No results for input(s): CHOL, [name], [name], [name], [name], LDLDIRECT in the last 72 hours. Thyroid Function Tests: No results for input(s): TSH, T4TOTAL, FREET4, T3FREE, THYROIDAB in the last 72 hours. Anemia Panel: No results for input(s): VITAMINB12, FOLATE, FERRITIN, TIBC, IRON, RETICCTPCT in the last 72  hours. Urine analysis:    Component Value Date/Time   COLORURINE YELLOW 09/12/2016 1247   APPEARANCEUR HAZY (A) 09/12/2016 1247   LABSPEC 1.014 09/12/2016 1247   LABSPEC 1.015 01/06/2015 1542   PHURINE 5.0 09/12/2016 1247   GLUCOSEU NEGATIVE 09/12/2016 1247   GLUCOSEU Negative 01/06/2015 1542   HGBUR NEGATIVE 09/12/2016 1247   HGBUR negative 12/03/2008 1113   BILIRUBINUR NEGATIVE 09/12/2016 1247   BILIRUBINUR Negative 01/06/2015 1542   KETONESUR NEGATIVE 09/12/2016 1247   PROTEINUR 30 (A) 09/12/2016 1247   UROBILINOGEN 0.2 01/06/2015 1542   NITRITE NEGATIVE 09/12/2016 1247   LEUKOCYTESUR NEGATIVE 09/12/2016 1247   LEUKOCYTESUR Negative 01/06/2015 1542   Sepsis Labs: Invalid input(s): PROCALCITONIN, LACTICIDVEN  Recent Results (from the past 240  hour(s))  Urine culture     Status: Abnormal   Collection Time: 09/12/16 12:47 PM  Result Value Ref Range Status   Specimen Description URINE, RANDOM  Final   Special Requests NONE  Final   Culture >=100,000 COLONIES/mL ESCHERICHIA COLI (A)  Final   Report Status 09/15/2016 FINAL  Final   Organism ID, Bacteria ESCHERICHIA COLI (A)  Final      Susceptibility   Escherichia coli - MIC*    AMPICILLIN >=32 RESISTANT Resistant     CEFAZOLIN <=4 SENSITIVE Sensitive     CEFTRIAXONE <=1 SENSITIVE Sensitive     CIPROFLOXACIN >=4 RESISTANT Resistant     GENTAMICIN >=16 RESISTANT Resistant     IMIPENEM <=0.25 SENSITIVE Sensitive     NITROFURANTOIN <=16 SENSITIVE Sensitive     TRIMETH/SULFA >=320 RESISTANT Resistant     AMPICILLIN/SULBACTAM 4 SENSITIVE Sensitive     PIP/TAZO <=4 SENSITIVE Sensitive     Extended ESBL NEGATIVE Sensitive     * >=100,000 COLONIES/mL ESCHERICHIA COLI  Blood Culture (routine x 2)     Status: None (Preliminary result)   Collection Time: 09/12/16  1:07 PM  Result Value Ref Range Status   Specimen Description BLOOD RIGHT ANTECUBITAL  Final   Special Requests   Final    BOTTLES DRAWN AEROBIC AND ANAEROBIC Blood  Culture adequate volume   Culture   Final    NO GROWTH 2 DAYS Performed at [facility], [address] [place], [place] [address]    Report Status PENDING  Incomplete  Blood Culture (routine x 2)     Status: None (Preliminary result)   Collection Time: 09/12/16  2:30 PM  Result Value Ref Range Status   Specimen Description BLOOD LEFT WRIST  Final   Special Requests IN PEDIATRIC BOTTLE Blood Culture adequate volume  Final   Culture   Final    NO GROWTH 2 DAYS Performed at [facility], [place] [address] [place], [place] [address]    Report Status PENDING  Incomplete  MRSA PCR Screening     Status: None   Collection Time: 09/12/16 10:59 PM  Result Value Ref Range Status   MRSA by PCR NEGATIVE NEGATIVE Final    Comment:        The GeneXpert MRSA Assay (FDA approved for NASAL specimens only), is one component of a comprehensive MRSA colonization surveillance program. It is not intended to diagnose MRSA infection nor to guide or monitor treatment for MRSA infections.       Radiology Studies: No results found.   Scheduled Meds: . albuterol  2.5 mg Nebulization TID  . aspirin EC  81 mg Oral Daily  . ceFEPime (MAXIPIME) IV  1 g Intravenous Q24H  . clopidogrel  75 mg Oral QHS  . clozapine  300 mg Oral QHS  . diltiazem  180 mg Oral Q breakfast  . docusate sodium  200 mg Oral QHS  . feeding supplement (ENSURE ENLIVE)  237 mL Oral BID BM  . heparin  5,000 Units Subcutaneous Q8H  . hydrALAZINE  10 mg Oral Q8H  . isosorbide mononitrate  60 mg Oral Daily  . levothyroxine  75 mcg Oral QAC breakfast  . loratadine  10 mg Oral Daily  . mouth rinse  15 mL Mouth Rinse BID  . metoprolol succinate  100 mg Oral Q breakfast  . mometasone-formoterol  2 puff Inhalation BID  . multivitamin with minerals  1 tablet Oral QHS  . oseltamivir  30  mg Oral Q24H  . pantoprazole  40 mg Oral Daily  . polyethylene glycol  17 g Oral Daily  . pravastatin  20 mg Oral QHS  . predniSONE   10 mg Oral Q breakfast  . tiotropium  18 mcg Inhalation Daily  . Vitamin D (Ergocalciferol)  50,000 Units Oral Q7 days   Continuous Infusions:  Marzetta Board, MD, PhD [facility] Pager [phone]  If 7PM-7AM, please contact night-coverage [email or web address] Password [email or web address] 09/15/2016, 10:51 AM

## 2016-09-16 DIAGNOSIS — J111 Influenza due to unidentified influenza virus with other respiratory manifestations: Secondary | ICD-10-CM | POA: Diagnosis not present

## 2016-09-16 DIAGNOSIS — M818 Other osteoporosis without current pathological fracture: Secondary | ICD-10-CM | POA: Diagnosis not present

## 2016-09-16 DIAGNOSIS — J1100 Influenza due to unidentified influenza virus with unspecified type of pneumonia: Secondary | ICD-10-CM | POA: Diagnosis not present

## 2016-09-16 DIAGNOSIS — R41841 Cognitive communication deficit: Secondary | ICD-10-CM | POA: Diagnosis not present

## 2016-09-16 DIAGNOSIS — E1149 Type 2 diabetes mellitus with other diabetic neurological complication: Secondary | ICD-10-CM | POA: Diagnosis not present

## 2016-09-16 DIAGNOSIS — Z5189 Encounter for other specified aftercare: Secondary | ICD-10-CM | POA: Diagnosis not present

## 2016-09-16 DIAGNOSIS — M353 Polymyalgia rheumatica: Secondary | ICD-10-CM | POA: Diagnosis not present

## 2016-09-16 DIAGNOSIS — D472 Monoclonal gammopathy: Secondary | ICD-10-CM | POA: Diagnosis not present

## 2016-09-16 DIAGNOSIS — E039 Hypothyroidism, unspecified: Secondary | ICD-10-CM | POA: Diagnosis not present

## 2016-09-16 DIAGNOSIS — M6281 Muscle weakness (generalized): Secondary | ICD-10-CM | POA: Diagnosis not present

## 2016-09-16 DIAGNOSIS — J449 Chronic obstructive pulmonary disease, unspecified: Secondary | ICD-10-CM | POA: Diagnosis not present

## 2016-09-16 DIAGNOSIS — R531 Weakness: Secondary | ICD-10-CM | POA: Diagnosis not present

## 2016-09-16 DIAGNOSIS — I10 Essential (primary) hypertension: Secondary | ICD-10-CM | POA: Diagnosis not present

## 2016-09-16 DIAGNOSIS — Z09 Encounter for follow-up examination after completed treatment for conditions other than malignant neoplasm: Secondary | ICD-10-CM | POA: Diagnosis not present

## 2016-09-16 DIAGNOSIS — J189 Pneumonia, unspecified organism: Secondary | ICD-10-CM | POA: Diagnosis not present

## 2016-09-16 DIAGNOSIS — D638 Anemia in other chronic diseases classified elsewhere: Secondary | ICD-10-CM | POA: Diagnosis not present

## 2016-09-16 DIAGNOSIS — R05 Cough: Secondary | ICD-10-CM | POA: Diagnosis not present

## 2016-09-16 DIAGNOSIS — B999 Unspecified infectious disease: Secondary | ICD-10-CM | POA: Diagnosis not present

## 2016-09-16 DIAGNOSIS — Q828 Other specified congenital malformations of skin: Secondary | ICD-10-CM | POA: Diagnosis not present

## 2016-09-16 DIAGNOSIS — D72829 Elevated white blood cell count, unspecified: Secondary | ICD-10-CM | POA: Diagnosis not present

## 2016-09-16 DIAGNOSIS — B351 Tinea unguium: Secondary | ICD-10-CM | POA: Diagnosis not present

## 2016-09-16 DIAGNOSIS — Z9181 History of falling: Secondary | ICD-10-CM | POA: Diagnosis not present

## 2016-09-16 DIAGNOSIS — R488 Other symbolic dysfunctions: Secondary | ICD-10-CM | POA: Diagnosis not present

## 2016-09-16 DIAGNOSIS — N179 Acute kidney failure, unspecified: Secondary | ICD-10-CM | POA: Diagnosis not present

## 2016-09-16 DIAGNOSIS — I2510 Atherosclerotic heart disease of native coronary artery without angina pectoris: Secondary | ICD-10-CM | POA: Diagnosis not present

## 2016-09-16 DIAGNOSIS — M79675 Pain in left toe(s): Secondary | ICD-10-CM | POA: Diagnosis not present

## 2016-09-16 DIAGNOSIS — M79676 Pain in unspecified toe(s): Secondary | ICD-10-CM | POA: Diagnosis not present

## 2016-09-16 DIAGNOSIS — D696 Thrombocytopenia, unspecified: Secondary | ICD-10-CM | POA: Diagnosis not present

## 2016-09-16 DIAGNOSIS — D631 Anemia in chronic kidney disease: Secondary | ICD-10-CM | POA: Diagnosis not present

## 2016-09-16 DIAGNOSIS — R1030 Lower abdominal pain, unspecified: Secondary | ICD-10-CM | POA: Diagnosis not present

## 2016-09-16 DIAGNOSIS — N390 Urinary tract infection, site not specified: Secondary | ICD-10-CM | POA: Diagnosis not present

## 2016-09-16 DIAGNOSIS — E785 Hyperlipidemia, unspecified: Secondary | ICD-10-CM | POA: Diagnosis not present

## 2016-09-16 DIAGNOSIS — N184 Chronic kidney disease, stage 4 (severe): Secondary | ICD-10-CM | POA: Diagnosis not present

## 2016-09-16 DIAGNOSIS — F259 Schizoaffective disorder, unspecified: Secondary | ICD-10-CM | POA: Diagnosis not present

## 2016-09-16 DIAGNOSIS — F5101 Primary insomnia: Secondary | ICD-10-CM | POA: Diagnosis not present

## 2016-09-16 DIAGNOSIS — K219 Gastro-esophageal reflux disease without esophagitis: Secondary | ICD-10-CM | POA: Diagnosis not present

## 2016-09-16 MED ORDER — DOXYCYCLINE HYCLATE 100 MG PO CAPS: 100.0000 mg | ORAL_CAPSULE | Freq: Two times a day (BID) | ORAL | 0 refills | Status: DC

## 2016-09-16 MED ORDER — AMOXICILLIN-POT CLAVULANATE 875-125 MG PO TABS: 1.0000 | ORAL_TABLET | Freq: Two times a day (BID) | ORAL | 0 refills | Status: DC

## 2016-09-16 MED ORDER — GUAIFENESIN-DM 100-10 MG/5ML PO SYRP: 5.0000 mL | ORAL_SOLUTION | ORAL | 0 refills | Status: DC | PRN

## 2016-09-16 MED ORDER — OSELTAMIVIR PHOSPHATE 30 MG PO CAPS: 30.0000 mg | ORAL_CAPSULE | ORAL | 0 refills | Status: DC

## 2016-09-16 MED ORDER — TEMAZEPAM 7.5 MG PO CAPS: 7.5000 mg | ORAL_CAPSULE | Freq: Every evening | ORAL | 0 refills | Status: DC | PRN

## 2016-09-16 MED ORDER — TRAMADOL HCL 50 MG PO TABS: 50.0000 mg | ORAL_TABLET | Freq: Two times a day (BID) | ORAL | 0 refills | Status: DC | PRN

## 2016-09-16 NOTE — Clinical Social Work Placement (Signed)
   CLINICAL SOCIAL WORK PLACEMENT  NOTE  Date:  09/16/2016  Patient Details  Name: Rebecca [name] MRN: [id number] Date of Birth: 12-Feb-1941  Clinical Social Work is seeking post-discharge placement for this patient at the [place] level of care (*CSW will initial, date and re-position this form in  chart as items are completed):  Yes   Patient/family provided with [facility] Work Department's list of facilities offering this level of care within the geographic area requested by the patient (or if unable, by the patient's family).  Yes   Patient/family informed of their freedom to choose among providers that offer the needed level of care, that participate in Medicare, Medicaid or managed care program needed by the patient, have an available bed and are willing to accept the patient.  Yes   Patient/family informed of [facility]'s ownership interest in [facility] and [facility], as well as of the fact that they are under no obligation to receive care at these facilities.  PASRR submitted to EDS on       PASRR number received on       Existing PASRR number confirmed on       FL2 transmitted to all facilities in geographic area requested by pt/family on 09/14/16     FL2 transmitted to all facilities within larger geographic area on       Patient informed that his/her managed care company has contracts with or will negotiate with certain facilities, including the following:  [employer] informed of bed offers received.  Patient chooses bed at [facility]     Physician recommends and patient chooses bed at      Patient to be transferred to [facility] on 09/16/16.  Patient to be transferred to facility by [facility]      Patient family notified on 09/16/16 of transfer.  Name of family member notified:  Daughter      PHYSICIAN       Additional Comment:    _______________________________________________ Lia Hopping, LCSW 09/16/2016, 1:38 PM

## 2016-09-16 NOTE — Progress Notes (Signed)
[facility] called for transport. 2 hours before pick up. Daughter informed.  Nurse given report number.   Kathrin Greathouse, Latanya Presser, MSW Clinical Social Worker 5E and Psychiatric Service Line [phone] 09/16/2016  2:36 PM

## 2016-09-16 NOTE — Progress Notes (Signed)
CSW spoke with patient daughter by phone and provided bed offers. Patient daughter is choosing between [facility] and [facility]. She has a tour at [facility] and plans inform CSW with choice.  CSW will continue to assist with placement.   Kathrin Greathouse, Latanya Presser, MSW Clinical Social Worker 5E and Psychiatric Service Line [phone] 09/16/2016  11:26 AM

## 2016-09-16 NOTE — Discharge Summary (Signed)
Physician Discharge Summary  Rebecca [name] PXT:[id number] DOB: Apr 19, 1941 DOA: 09/12/2016  PCP: [name], MD  Admit date: 09/12/2016 Discharge date: 09/16/2016  Admitted From: home Disposition:  SNF  Recommendations for Outpatient Follow-up:  1. Follow up with PCP in 1-2 weeks 2. Continue Tamiflu for 1 more dose 3. Continue Augmentin for 4 more days 4. Continue respiratory care with Robitussin, incentive spirometry  Home Health: none  Equipment/Devices: none   Discharge Condition: stable CODE STATUS: Full Diet recommendation: regular  HPI: Per Dr. Benny Lennert, The patient is a 76 yr old woman who lives at home with her daughter. She was discharged from this facility on 07/19/2016 after a stay for pneumonia and sepsis. According to the patients daughter she had been doing well last night when she was sounding a little congested. This morning the patient fell to the floor a short distance from her low bed and was not able to get up. She was confused according to the patient's daughter. She was very weak and required the assistance of two people to get her onto her feet. She had a low grade temperature. EMS was called. They found her to be hypoxic upon their arrival. The patient's sensorium improved with the placemnt of oxygen. The patient has a past medical history of PMR for which she takes 10 mg of prednisone everyday. She also has COPD, HTN, CAD, GERD, Schizoaffective disorder, Stage 4 CKD, and dyslipidemia.   Hospital Course: Discharge Diagnoses:  Active Problems:   Hypothyroidism   Dyslipidemia   Anemia in chronic renal disease   Schizoaffective disorder (HCC)   GASTROESOPHAGEAL REFLUX, NO ESOPHAGITIS   Polymyalgia rheumatica (HCC)   COPD exacerbation (HCC)   MGUS (monoclonal gammopathy of unknown significance)   CKD stage 4, GFR 15-29 ml/min    Coronary artery disease involving native coronary artery of native heart without angina pectoris   Benign essential HTN   Sepsis due  to pneumonia (HCC)   Healthcare-associated pneumonia   AKI (acute kidney injury) ([place])   HAP (hospital-acquired pneumonia)   Respiratory distress  Sepsis with hypotension, secondary to influenza B and left-sided pneumonia-Admitted to the floor, had hypoxic and hypercarbic respiratory failure requiring BiPAP and need to transfer to stepdown immediately after admission.  With supportive treatment with antivirals and antibiotics she improved, she was able to be weaned off of BiPAP, and eventually on room air.  Her antibiotics were narrowed to Augmentin, and she will require 4 additional days to complete an 8 day total.  She has 1 day of Tamiflu left for 5 day total treatment Acute hypoxic and hypercarbic respiratory failure -resolved, on room air, did not require BiPAP use anymore.  Suspect she has underlying obstructive sleep apnea, would recommend outpatient follow-up with PCP for referral. UTI -not clear if patient had symptoms on presentation, not the best historian, however urinalysis showed E. coli which was sensitive to Augmentin, will continue treatment for 4 additional days as per problem #1 Drowsiness -intermittent, without associated confusion, especially in the mornings.  Daughter states that this is usual for her.  Decreased her Restoril on discharge, should be as needed HTN/CAD - no ACS type symptoms at this time, continue Toprol-XL, pravastatin, isosorbide, aspirin and Plavix  Generalized weakness /history of polymyalgia rheumatica/MGUS -continue prednisone 10 mg daily CKD stage III - IV - creatinine appears to be at or below her recent baseline, now ~ 1.5 on discharge Hypothyroidism -Continue Synthroid   Discharge Instructions  Allergies as of 09/16/2016  Reactions   Levaquin [levofloxacin] Other (See Comments)   Ruptured tendon   Tape Other (See Comments)   PATIENT'S SKIN IS VERY, VERY THIN AND WILL TEAR AND BRUISE EASILY; PLEASE USE AN ALTERNATIVE (SOMETHING OTHER THAN TAPE)       Medication List    TAKE these medications   acetaminophen 500 MG tablet Commonly known as:  TYLENOL Take 1,000 mg by mouth every 6 (six) hours as needed (pain).   amoxicillin-clavulanate 875-125 MG tablet Commonly known as:  AUGMENTIN Take 1 tablet by mouth 2 (two) times daily. What changed:  when to take this   aspirin 81 MG EC tablet Take 1 tablet (81 mg total) by mouth daily.   cetirizine 10 MG tablet Commonly known as:  ZYRTEC Take 10 mg by mouth at bedtime.   clopidogrel 75 MG tablet Commonly known as:  PLAVIX Take 1 tablet (75 mg total) by mouth daily with breakfast. What changed:  when to take this   clozapine 200 MG tablet Commonly known as:  CLOZARIL Take 300 mg by mouth at bedtime. ]   COLACE 100 MG capsule Generic drug:  docusate sodium Take 200 mg by mouth at bedtime.   denosumab 60 MG/ML Soln injection Commonly known as:  PROLIA Inject 60 mg into the skin every 6 (six) months. Administer in upper arm, thigh, or abdomen   diltiazem 180 MG 24 hr tablet Commonly known as:  CARDIZEM LA Take 180 mg by mouth daily with breakfast.   Fluticasone-Salmeterol 250-50 MCG/DOSE Aepb Commonly known as:  ADVAIR Inhale 1 puff into the lungs at bedtime.   guaiFENesin-dextromethorphan 100-10 MG/5ML syrup Commonly known as:  ROBITUSSIN DM Take 5 mLs by mouth every 4 (four) hours as needed for cough.   isosorbide mononitrate 60 MG 24 hr tablet Commonly known as:  IMDUR Take 1 tablet (60 mg total) by mouth daily.   levothyroxine 75 MCG tablet Commonly known as:  SYNTHROID, LEVOTHROID Take 75 mcg by mouth daily before breakfast.   metoprolol succinate 100 MG 24 hr tablet Commonly known as:  TOPROL-XL Take 100 mg by mouth daily with breakfast. Take with or immediately following a meal.   nitroGLYCERIN 0.4 MG SL tablet Commonly known as:  NITROSTAT Place 1 tablet (0.4 mg total) under the tongue every 5 (five) minutes as needed. For chest pain   ONE-A-DAY  MENS 50+ ADVANTAGE Tabs Take 1 tablet by mouth at bedtime.   oseltamivir 30 MG capsule Commonly known as:  TAMIFLU Take 1 capsule (30 mg total) by mouth daily. One more dose on 4/5   pantoprazole 40 MG tablet Commonly known as:  PROTONIX Take 1 tablet (40 mg total) by mouth daily. What changed:  when to take this   polyethylene glycol powder powder Commonly known as:  GLYCOLAX/MIRALAX Take 17 g by mouth daily. MIX AND DRINK   pravastatin 20 MG tablet Commonly known as:  PRAVACHOL Take 20 mg by mouth at bedtime.   predniSONE 10 MG tablet Commonly known as:  DELTASONE Take 10 mg by mouth daily with breakfast.   PROAIR HFA 108 (90 Base) MCG/ACT inhaler Generic drug:  albuterol Inhale 2 puffs into the lungs every 6 (six) hours as needed for wheezing or shortness of breath.   SM CRANBERRY 300 MG tablet Generic drug:  Cranberry Take 300 mg by mouth at bedtime.   SPIRIVA RESPIMAT 2.5 MCG/ACT Aers Generic drug:  Tiotropium Bromide Monohydrate Inhale 2 puffs into the lungs every morning.   temazepam 7.5 MG  capsule Commonly known as:  RESTORIL Take 1 capsule (7.5 mg total) by mouth at bedtime as needed for sleep. What changed:  medication strength  how much to take   traMADol 300 MG 24 hr tablet Commonly known as:  ULTRAM-ER Take 300 mg by mouth every morning. What changed:  Another medication with the same name was changed. Make sure you understand how and when to take each.   traMADol 50 MG tablet Commonly known as:  ULTRAM Take 1 tablet (50 mg total) by mouth 2 (two) times daily as needed (for breakthrough pain). What changed:  how much to take   Vitamin D (Ergocalciferol) 50000 units Caps capsule Commonly known as:  DRISDOL Take 1 capsule (50,000 Units total) by mouth every 7 (seven) days.       Allergies  Allergen Reactions  . Levaquin [Levofloxacin] Other (See Comments)    Ruptured tendon  . Tape Other (See Comments)    PATIENT'S SKIN IS VERY, VERY THIN  AND WILL TEAR AND BRUISE EASILY; PLEASE USE AN ALTERNATIVE (SOMETHING OTHER THAN TAPE)    Consultations:  None   Procedures/Studies:  Dg Chest 1 View  Result Date: 09/12/2016 CLINICAL DATA:  Productive cough.  Fell last night. EXAM: CHEST 1 VIEW COMPARISON:  07/14/2016. FINDINGS: Borderline enlarged cardiac silhouette. Aortic calcification. Stable linear densities at the left lung base and small amount of left pleural thickening or fluid. Lower thoracic spine and bilateral shoulder degenerative changes. IMPRESSION: 1. Stable left basilar linear atelectasis or scarring and small amount of left pleural thickening or fluid. 2. Aortic atherosclerosis. Electronically Signed   By: Claudie Revering M.D.   On: 09/12/2016 14:16   Dg Pelvis 1-2 Views  Result Date: 09/12/2016 CLINICAL DATA:  Pelvic pain following a fall last night. EXAM: PELVIS - 1-2 VIEW COMPARISON:  Pelvis CT dated 09/25/2015 FINDINGS: No pelvic fracture or dislocation seen. Lower lumbar spine degenerative changes. IMPRESSION: No fracture. Electronically Signed   By: Claudie Revering M.D.   On: 09/12/2016 14:18   Ct Head Wo Contrast  Result Date: 09/12/2016 CLINICAL DATA:  Per EMS, pt is coming from home after experiencing a fall at home last night. Pt lives with her daughter and daughter stated pt did not hit her head when fall occurred. Daughter reports that pt had a fever and a productive cough last night and had increased weakness after the fall occurred. Pt usually ambulates independently but now requires assistance to get up out of the bed. Pt AO x4. EXAM: CT HEAD WITHOUT CONTRAST CT CERVICAL SPINE WITHOUT CONTRAST TECHNIQUE: Multidetector CT imaging of the head and cervical spine was performed following the standard protocol without intravenous contrast. Multiplanar CT image reconstructions of the cervical spine were also generated. COMPARISON:  07/14/2016 FINDINGS: CT HEAD FINDINGS Brain: No evidence of acute infarction, hemorrhage,  hydrocephalus, extra-axial collection or mass lesion/mass effect. Age related volume loss. Mild chronic microvascular ischemic change. 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 Stable appearance from the prior exam. Vascular: No hyperdense vessel or unexpected calcification. Skull: Normal. Negative for fracture or focal lesion. Sinuses/Orbits: Visualized globes and orbits are unremarkable. Visualized sinuses and mastoid air cells are clear. Other: None. CT CERVICAL SPINE FINDINGS Alignment: Mild reversal of the normal cervical lordosis, apex at C4. No spondylolisthesis. Skull base and vertebrae: No acute fracture. No primary bone lesion or focal pathologic process. Soft tissues and spinal canal: No prevertebral fluid or swelling. No visible canal hematoma. Disc levels: Mild loss of disc height from C3-C4 through  C5-C6. Endplate spurring noted from C4 through C7. Facet degenerative change noted most prominent on the right at C2-C3. No disc herniations. Upper chest: No masses.  No acute findings. Other: None. IMPRESSION: HEAD CT:  No acute intracranial abnormalities.  No skull fracture. CERVICAL CT:  No fracture or acute finding. Electronically Signed   By: Lajean Manes M.D.   On: 09/12/2016 14:29   Ct Cervical Spine Wo Contrast  Result Date: 09/12/2016 CLINICAL DATA:  Per EMS, pt is coming from home after experiencing a fall at home last night. Pt lives with her daughter and daughter stated pt did not hit her head when fall occurred. Daughter reports that pt had a fever and a productive cough last night and had increased weakness after the fall occurred. Pt usually ambulates independently but now requires assistance to get up out of the bed. Pt AO x4. EXAM: CT HEAD WITHOUT CONTRAST CT CERVICAL SPINE WITHOUT CONTRAST TECHNIQUE: Multidetector CT imaging of the head and cervical spine was performed following the standard protocol without intravenous contrast. Multiplanar CT image reconstructions of the cervical  spine were also generated. COMPARISON:  07/14/2016 FINDINGS: CT HEAD FINDINGS Brain: No evidence of acute infarction, hemorrhage, hydrocephalus, extra-axial collection or mass lesion/mass effect. Age related volume loss. Mild chronic microvascular ischemic change. 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 Stable appearance from the prior exam. Vascular: No hyperdense vessel or unexpected calcification. Skull: Normal. Negative for fracture or focal lesion. Sinuses/Orbits: Visualized globes and orbits are unremarkable. Visualized sinuses and mastoid air cells are clear. Other: None. CT CERVICAL SPINE FINDINGS Alignment: Mild reversal of the normal cervical lordosis, apex at C4. No spondylolisthesis. Skull base and vertebrae: No acute fracture. No primary bone lesion or focal pathologic process. Soft tissues and spinal canal: No prevertebral fluid or swelling. No visible canal hematoma. Disc levels: Mild loss of disc height from C3-C4 through C5-C6. Endplate spurring noted from C4 through C7. Facet degenerative change noted most prominent on the right at C2-C3. No disc herniations. Upper chest: No masses.  No acute findings. Other: None. IMPRESSION: HEAD CT:  No acute intracranial abnormalities.  No skull fracture. CERVICAL CT:  No fracture or acute finding. Electronically Signed   By: Lajean Manes M.D.   On: 09/12/2016 14:29   Dg Chest Port 1 View  Result Date: 09/15/2016 CLINICAL DATA:  Rhonchi. EXAM: PORTABLE CHEST 1 VIEW COMPARISON:  09/13/2016 . FINDINGS: Mediastinum hilar structures normal. Cardiomegaly with normal pulmonary vascularity. No focal infiltrate. Stable pleural thickening on the left consistent with scarring. No pneumothorax . IMPRESSION: 1.  Stable cardiomegaly No pulmonary venous congestion. 2. Stable left base pleuroparenchymal thickening consistent with scarring. No focal infiltrate identified. Electronically Signed   By: Marcello Moores  Register   On: 09/15/2016 11:04   Dg Chest Port 1  View  Result Date: 09/13/2016 CLINICAL DATA:  Abnormal breathing sounds. History of COPD, CHF, diabetes. EXAM: PORTABLE CHEST 1 VIEW COMPARISON:  Chest radiograph September 12, 2016 FINDINGS: The cardiac silhouette is mildly enlarged unchanged. Calcified aortic knob. Similar patchy LEFT lung base airspace opacity, blunted costophrenic angle. RIGHT lung is clear. No pneumothorax. Soft tissue planes and included osseous structures are nonsuspicious. IMPRESSION: Stable LEFT basilar airspace opacity with small LEFT pleural effusion versus pleural thickening. Electronically Signed   By: Elon Alas M.D.   On: 09/13/2016 01:58    Subjective: - no chest pain, shortness of breath, no abdominal pain, nausea or vomiting.   Discharge Exam: Vitals:   09/16/16 0526 09/16/16 1335  BP: 132/62 (!) 89/73  Pulse: 68 75  Resp: 20 19  Temp: 97.7 F (36.5 C) 98.1 F (36.7 C)   Vitals:   09/15/16 1953 09/16/16 0526 09/16/16 0931 09/16/16 1335  BP:  132/62  (!) 89/73  Pulse: 69 68  75  Resp: 20 20  19   Temp:  97.7 F (36.5 C)  98.1 F (36.7 C)  TempSrc:  Oral  Oral  SpO2: 95% 95% 97% 95%  Weight:      Height:        General: Pt is alert, awake, not in acute distress Cardiovascular: RRR, S1/S2 +, no rubs, no gallops Respiratory: no wheezing, no rhonchi, slight rhonchi bilaterally Abdominal: Soft, NT, ND, bowel sounds + Extremities: no edema, no cyanosis    The results of significant diagnostics from this hospitalization (including imaging, microbiology, ancillary and laboratory) are listed below for reference.     Microbiology: Recent Results (from the past 240 hour(s))  Urine culture     Status: Abnormal   Collection Time: 09/12/16 12:47 PM  Result Value Ref Range Status   Specimen Description URINE, RANDOM  Final   Special Requests NONE  Final   Culture >=100,000 COLONIES/mL ESCHERICHIA COLI (A)  Final   Report Status 09/15/2016 FINAL  Final   Organism ID, Bacteria ESCHERICHIA COLI (A)   Final      Susceptibility   Escherichia coli - MIC*    AMPICILLIN >=32 RESISTANT Resistant     CEFAZOLIN <=4 SENSITIVE Sensitive     CEFTRIAXONE <=1 SENSITIVE Sensitive     CIPROFLOXACIN >=4 RESISTANT Resistant     GENTAMICIN >=16 RESISTANT Resistant     IMIPENEM <=0.25 SENSITIVE Sensitive     NITROFURANTOIN <=16 SENSITIVE Sensitive     TRIMETH/SULFA >=320 RESISTANT Resistant     AMPICILLIN/SULBACTAM 4 SENSITIVE Sensitive     PIP/TAZO <=4 SENSITIVE Sensitive     Extended ESBL NEGATIVE Sensitive     * >=100,000 COLONIES/mL ESCHERICHIA COLI  Blood Culture (routine x 2)     Status: None (Preliminary result)   Collection Time: 09/12/16  1:07 PM  Result Value Ref Range Status   Specimen Description BLOOD RIGHT ANTECUBITAL  Final   Special Requests   Final    BOTTLES DRAWN AEROBIC AND ANAEROBIC Blood Culture adequate volume   Culture   Final    NO GROWTH 3 DAYS Performed at [facility], [address], [place], [place] [address]    Report Status PENDING  Incomplete  Blood Culture (routine x 2)     Status: None (Preliminary result)   Collection Time: 09/12/16  2:30 PM  Result Value Ref Range Status   Specimen Description BLOOD LEFT WRIST  Final   Special Requests IN PEDIATRIC BOTTLE Blood Culture adequate volume  Final   Culture   Final    NO GROWTH 3 DAYS Performed at [facility], [place] [address] [place], [place] [address]    Report Status PENDING  Incomplete  MRSA PCR Screening     Status: None   Collection Time: 09/12/16 10:59 PM  Result Value Ref Range Status   MRSA by PCR NEGATIVE NEGATIVE Final    Comment:        The GeneXpert MRSA Assay (FDA approved for NASAL specimens only), is one component of a comprehensive MRSA colonization surveillance program. It is not intended to diagnose MRSA infection nor to guide or monitor treatment for MRSA infections.      Labs: BNP (last 3  results)  Recent Labs  07/14/16 1525  BNP 831.5*   Basic Metabolic  Panel:  Recent Labs Lab 09/12/16 1307 09/13/16 0348 09/15/16 0340  NA 138 140 138  K 4.7 4.3 4.7  CL 104 110 107  CO2 26 23 25   GLUCOSE 196* 81 93  BUN 38* 38* 31*  CREATININE 2.01* 1.66* 1.50*  CALCIUM 9.8 8.3* 8.7*   Liver Function Tests:  Recent Labs Lab 09/12/16 1307 09/15/16 0340  AST 25 23  ALT 14 16  ALKPHOS 38 29*  BILITOT 0.7 0.6  PROT 6.8 5.9*  ALBUMIN 3.6 3.0*   No results for input(s): LIPASE, AMYLASE in the last 168 hours. No results for input(s): AMMONIA in the last 168 hours. CBC:  Recent Labs Lab 09/12/16 1307 09/13/16 0348 09/15/16 0340  WBC 13.6* 9.6 5.5  NEUTROABS 12.0* 6.9  --   HGB 11.1* 10.2* 9.4*  HCT 34.4* 30.8* 28.0*  MCV 94.0 93.3 88.9  PLT 168 135* 129*   Cardiac Enzymes:  Recent Labs Lab 09/12/16 1307  CKTOTAL 151   BNP: Invalid input(s): POCBNP CBG:  Recent Labs Lab 09/12/16 2033 09/13/16 1719  GLUCAP 113* 174*   D-Dimer No results for input(s): DDIMER in the last 72 hours. Hgb A1c No results for input(s): HGBA1C in the last 72 hours. Lipid Profile No results for input(s): CHOL, [name], [name], [name], [name], LDLDIRECT in the last 72 hours. Thyroid function studies No results for input(s): TSH, T4TOTAL, T3FREE, THYROIDAB in the last 72 hours.  Invalid input(s): FREET3 Anemia work up No results for input(s): VITAMINB12, FOLATE, FERRITIN, TIBC, IRON, RETICCTPCT in the last 72 hours. Urinalysis    Component Value Date/Time   COLORURINE YELLOW 09/12/2016 1247   APPEARANCEUR HAZY (A) 09/12/2016 1247   LABSPEC 1.014 09/12/2016 1247   LABSPEC 1.015 01/06/2015 1542   PHURINE 5.0 09/12/2016 1247   GLUCOSEU NEGATIVE 09/12/2016 1247   GLUCOSEU Negative 01/06/2015 1542   HGBUR NEGATIVE 09/12/2016 1247   HGBUR negative 12/03/2008 1113   BILIRUBINUR NEGATIVE 09/12/2016 1247   BILIRUBINUR Negative 01/06/2015 1542   KETONESUR NEGATIVE 09/12/2016 1247   PROTEINUR 30 (A) 09/12/2016 1247   UROBILINOGEN 0.2 01/06/2015  1542   NITRITE NEGATIVE 09/12/2016 1247   LEUKOCYTESUR NEGATIVE 09/12/2016 1247   LEUKOCYTESUR Negative 01/06/2015 1542   Sepsis Labs Invalid input(s): PROCALCITONIN,  WBC,  LACTICIDVEN Microbiology Recent Results (from the past 240 hour(s))  Urine culture     Status: Abnormal   Collection Time: 09/12/16 12:47 PM  Result Value Ref Range Status   Specimen Description URINE, RANDOM  Final   Special Requests NONE  Final   Culture >=100,000 COLONIES/mL ESCHERICHIA COLI (A)  Final   Report Status 09/15/2016 FINAL  Final   Organism ID, Bacteria ESCHERICHIA COLI (A)  Final      Susceptibility   Escherichia coli - MIC*    AMPICILLIN >=32 RESISTANT Resistant     CEFAZOLIN <=4 SENSITIVE Sensitive     CEFTRIAXONE <=1 SENSITIVE Sensitive     CIPROFLOXACIN >=4 RESISTANT Resistant     GENTAMICIN >=16 RESISTANT Resistant     IMIPENEM <=0.25 SENSITIVE Sensitive     NITROFURANTOIN <=16 SENSITIVE Sensitive     TRIMETH/SULFA >=320 RESISTANT Resistant     AMPICILLIN/SULBACTAM 4 SENSITIVE Sensitive     PIP/TAZO <=4 SENSITIVE Sensitive     Extended ESBL NEGATIVE Sensitive     * >=100,000 COLONIES/mL ESCHERICHIA COLI  Blood Culture (routine x 2)     Status: None (Preliminary result)  Collection Time: 09/12/16  1:07 PM  Result Value Ref Range Status   Specimen Description BLOOD RIGHT ANTECUBITAL  Final   Special Requests   Final    BOTTLES DRAWN AEROBIC AND ANAEROBIC Blood Culture adequate volume   Culture   Final    NO GROWTH 3 DAYS Performed at [facility], [address] [place], [place] [address]    Report Status PENDING  Incomplete  Blood Culture (routine x 2)     Status: None (Preliminary result)   Collection Time: 09/12/16  2:30 PM  Result Value Ref Range Status   Specimen Description BLOOD LEFT WRIST  Final   Special Requests IN PEDIATRIC BOTTLE Blood Culture adequate volume  Final   Culture   Final    NO GROWTH 3 DAYS Performed at [facility], [place] [address],  [place], [place] [address]    Report Status PENDING  Incomplete  MRSA PCR Screening     Status: None   Collection Time: 09/12/16 10:59 PM  Result Value Ref Range Status   MRSA by PCR NEGATIVE NEGATIVE Final    Comment:        The GeneXpert MRSA Assay (FDA approved for NASAL specimens only), is one component of a comprehensive MRSA colonization surveillance program. It is not intended to diagnose MRSA infection nor to guide or monitor treatment for MRSA infections.      Time coordinating discharge: 40 minutes  SIGNED:  Marzetta Board, MD  [facility] 09/16/2016, 1:39 PM Pager [phone]  If 7PM-7AM, please contact night-coverage [email or web address] Password [email or web address]

## 2016-09-16 NOTE — Care Management Important Message (Addendum)
Important Message  Patient Details IM Letter given to Kathy/Case Manager to present to Patient Name: TYNASIA MCCAUL MRN: [id number] Date of Birth: [birth year]   Medicare Important Message Given:  Yes    Kerin Salen 09/16/2016, 10:38 AMImportant Message  Patient Details  Name: CINDY FULLMAN MRN: [id number] Date of Birth: 01/24/1941   Medicare Important Message Given:  Yes    Kerin Salen 09/16/2016, 10:38 AM

## 2016-09-16 NOTE — Care Management Note (Signed)
Case Management Note  Patient Details  Name: Rebecca [name] MRN: [id number] Date of Birth: 06/14/1941  Subjective/Objective:                    Action/Plan:d/c SNF.   Expected Discharge Date:  09/16/16               Expected Discharge Plan:  Skilled Nursing Facility  In-House Referral:  Clinical Social Work  Discharge planning Services  CM Consult  Post Acute Care Choice:    Choice offered to:     DME Arranged:    DME Agency:     HH Arranged:    [place] Agency:     Status of Service:  Completed, signed off  If discussed at [employer], dates discussed:    Additional Comments:  Dessa Phi, RN 09/16/2016, 3:28 PM

## 2016-09-16 NOTE — Progress Notes (Signed)
qPhysical Therapy Treatment Patient Details Name: RAYVIN ABID MRN: [id number] DOB: 1941/06/13 Today's Date: 09/16/2016    History of Present Illness Cally A Esker is a 76 y.o. female admitted on 09/12/16 with respiratory distres. Recently in [facility]ital for pneumonia. positive for influenza B. Medical history significant for recently discovered IgG lambda MGUS, memory loss, PMR (on prednisone), HTN    PT Comments    Assisted out of recliner to amb to bathroom then a limited distance in hallway.  Actvity limited by fatigue and R flank/hip pain from "where I fell".    Follow Up Recommendations  Home health PT (per chart review, pt plans to return home with daughter.  LPT rec SNF. )     Equipment Recommendations  None recommended by PT    Recommendations for Other Services       Precautions / Restrictions Precautions Precautions: Fall Restrictions Weight Bearing Restrictions: No    Mobility  Bed Mobility Overal bed mobility: Needs Assistance Bed Mobility: Sit to Supine       Sit to supine: Supervision;Min guard   General bed mobility comments: able to self perform with increased time and use of rail  Transfers Overall transfer level: Needs assistance Equipment used: 1 person hand held assist Transfers: Sit to/from Stand;Stand Pivot Transfers Sit to Stand: Supervision;Min guard Stand pivot transfers: Supervision;Min guard       General transfer comment: <25% VC's on proper hand placement and increased time to rise due to pain  Ambulation/Gait Ambulation/Gait assistance: Min guard Ambulation Distance (Feet): 45 Feet Assistive device: Rolling walker (2 wheeled) Gait Pattern/deviations: Step-to pattern;Step-through pattern Gait velocity: decreased   General Gait Details: amb a limited distance due to fatigue and pain level.  < 25% VC's on safety with turns using RW.     Stairs            Wheelchair Mobility    Modified Rankin (Stroke Patients Only)        Balance                                            Cognition Arousal/Alertness: Awake/alert Behavior During Therapy: WFL for tasks assessed/performed Overall Cognitive Status: Within Functional Limits for tasks assessed                                        Exercises      General Comments        Pertinent Vitals/Pain Pain Assessment: Faces Faces Pain Scale: Hurts even more Pain Location: R side "where I fell" Pain Descriptors / Indicators: Grimacing;Sore Pain Intervention(s): Limited activity within patient's tolerance;Monitored during session;Repositioned    Home Living                      Prior Function            PT Goals (current goals can now be found in the care plan section) Progress towards PT goals: Progressing toward goals    Frequency    Min 3X/week      PT Plan Current plan remains appropriate    Co-evaluation             End of Session Equipment Utilized During Treatment: Gait belt Activity Tolerance: Patient limited by fatigue;Patient limited by pain Patient  left: in bed;with bed alarm set;with call bell/phone within reach Nurse Communication: Mobility status PT Visit Diagnosis: Difficulty in walking, not elsewhere classified (R26.2)     Time: 1350-1415 PT Time Calculation (min) (ACUTE ONLY): 25 min  Charges:  $Gait Training: 8-22 mins $Therapeutic Activity: 8-22 mins                    G Codes:       Rica Koyanagi  PTA [facility]  Acute  Rehab Pager      [phone]

## 2016-09-17 ENCOUNTER — Non-Acute Institutional Stay (SKILLED_NURSING_FACILITY): Payer: Medicare Other | Admitting: Internal Medicine

## 2016-09-17 ENCOUNTER — Encounter: Payer: Self-pay | Admitting: Internal Medicine

## 2016-09-17 DIAGNOSIS — K219 Gastro-esophageal reflux disease without esophagitis: Secondary | ICD-10-CM

## 2016-09-17 DIAGNOSIS — D696 Thrombocytopenia, unspecified: Secondary | ICD-10-CM | POA: Diagnosis not present

## 2016-09-17 DIAGNOSIS — R531 Weakness: Secondary | ICD-10-CM | POA: Diagnosis not present

## 2016-09-17 DIAGNOSIS — I10 Essential (primary) hypertension: Secondary | ICD-10-CM | POA: Diagnosis not present

## 2016-09-17 DIAGNOSIS — M353 Polymyalgia rheumatica: Secondary | ICD-10-CM | POA: Diagnosis not present

## 2016-09-17 DIAGNOSIS — J1100 Influenza due to unidentified influenza virus with unspecified type of pneumonia: Secondary | ICD-10-CM

## 2016-09-17 DIAGNOSIS — I2510 Atherosclerotic heart disease of native coronary artery without angina pectoris: Secondary | ICD-10-CM | POA: Diagnosis not present

## 2016-09-17 DIAGNOSIS — J111 Influenza due to unidentified influenza virus with other respiratory manifestations: Secondary | ICD-10-CM

## 2016-09-17 DIAGNOSIS — E039 Hypothyroidism, unspecified: Secondary | ICD-10-CM

## 2016-09-17 DIAGNOSIS — N390 Urinary tract infection, site not specified: Secondary | ICD-10-CM

## 2016-09-17 DIAGNOSIS — N184 Chronic kidney disease, stage 4 (severe): Secondary | ICD-10-CM | POA: Diagnosis not present

## 2016-09-17 DIAGNOSIS — F5101 Primary insomnia: Secondary | ICD-10-CM

## 2016-09-17 DIAGNOSIS — B9620 Unspecified Escherichia coli [E. coli] as the cause of diseases classified elsewhere: Secondary | ICD-10-CM

## 2016-09-17 DIAGNOSIS — J449 Chronic obstructive pulmonary disease, unspecified: Secondary | ICD-10-CM | POA: Diagnosis not present

## 2016-09-17 DIAGNOSIS — M818 Other osteoporosis without current pathological fracture: Secondary | ICD-10-CM

## 2016-09-17 DIAGNOSIS — D638 Anemia in other chronic diseases classified elsewhere: Secondary | ICD-10-CM | POA: Diagnosis not present

## 2016-09-17 DIAGNOSIS — L89152 Pressure ulcer of sacral region, stage 2: Secondary | ICD-10-CM

## 2016-09-17 LAB — CULTURE, BLOOD (ROUTINE X 2)
CULTURE: NO GROWTH
Culture: NO GROWTH
SPECIAL REQUESTS: ADEQUATE
SPECIAL REQUESTS: ADEQUATE

## 2016-09-17 NOTE — Progress Notes (Signed)
LOCATION: [facility]  PCP: [name], MD   Code Status: Full Code  Goals of care: Advanced Directive information Advanced Directives 09/12/2016  Does Patient Have a Medical Advance Directive? No  Type of Advance Directive -  Does patient want to make changes to medical advance directive? -  Copy of [place] in Chart? -  Would patient like information on creating a medical advance directive? No - Patient declined  Pre-existing out of facility DNR order (yellow form or pink MOST form) -       Extended Emergency Contact Information Primary Emergency Contact: Linehan,Amanda Address: [address]          [place], [place] [address] [place] Phone: [phone] Mobile Phone: [phone] Relation: Daughter Secondary Emergency Contact: Lucas,Richard Address: [address]           [place], [place] [address] [place] Phone: [phone] Mobile Phone: [phone] Relation: Other   Allergies  Allergen Reactions  . Levaquin [Levofloxacin] Other (See Comments)    Ruptured tendon  . Tape Other (See Comments)    PATIENT'S SKIN IS VERY, VERY THIN AND WILL TEAR AND BRUISE EASILY; PLEASE USE AN ALTERNATIVE (SOMETHING OTHER THAN TAPE)    Chief Complaint  Patient presents with  . New Admit To SNF    New Admission Visit      HPI:  Patient is a 76 y.o. female seen today for short term rehabilitation post hospital admission from 09/12/2016-09/16/2016 with sepsis and acute respiratory failure. She was diagnosed with left sided pneumonia and influenza and e.coli UTI. She required IV fluids, IV antibiotic, Tamiflu and BiPAP. She has medical history of COPD, hypertension, coronary artery disease, chronic kidney disease stage IV, GERD, PMR on chronic prednisone among others. She is seen in her room today.  Review of Systems:  Constitutional: Negative for fever, chills, diaphoresis.  HENT: Negative for headache,  congestion, difficulty swallowing. Positive for runny nose and dry mouth. Eyes: Negative for eye pain, blurred vision, double vision and discharge.  Respiratory: Negative for shortness of breath and wheezing. Positive for cough with yellow phlegm.  Cardiovascular: Negative for chest pain, palpitations.  Gastrointestinal: Negative for heartburn, nausea, vomiting, abdominal pain, loss of appetite. Last bowel movement was yesterday. Genitourinary: Negative for dysuria. Musculoskeletal: Negative for back pain, fall in the facility. Positive for pain to her buttock and hip.  Skin: Negative for itching, rash.  Neurological: Negative for dizziness. Psychiatric/Behavioral: Negative for depression.   Past Medical History:  Diagnosis Date  . CAD (coronary artery disease) 2006   Taxus stent of mid RCA. negative myoview 2014 and normal LV function  . CKD (chronic kidney disease) stage 4, GFR 15-29 ml/min (HCC)   . Complication of anesthesia   . COPD (chronic obstructive pulmonary disease) ([place])   . Diabetes mellitus   . Diastolic heart failure, NYHA class 1 ([place])   . Dysuria 01/06/2015  . Frozen shoulder    right  . Headache   . Hyperkalemia 01/01/2016  . 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 Obesity   . Pneumonia 06/2016  . Polymyalgia rheumatica ([place])   . PONV (postoperative nausea and vomiting)   . Renal insufficiency   . Shortness of breath   . Stroke [facility])    Past Surgical History:  Procedure Laterality Date  . ABDOMINAL HYSTERECTOMY    . CARDIAC CATHETERIZATION N/A 08/13/2015   Procedure: Left Heart Cath and Coronary Angiography;  Surgeon: Conception Oms  Hassell Done, MD;  Location: [place] [facility];  Service: Cardiovascular;  Laterality: N/A;  . CATARACT EXTRACTION    . CHOLECYSTECTOMY    . CORONARY ANGIOPLASTY WITH STENT PLACEMENT  2006   Taxus DES to RCA, 50 % residual  . PARTIAL HYSTERECTOMY  1995   Social History:   reports that she quit smoking about 12  years ago. She has a 35.00 pack-year smoking history. She has never used smokeless tobacco. She reports that she does not drink alcohol or use drugs.  Family History  Problem Relation Age of Onset  . Diabetes Mother   . Heart disease Mother   . Heart disease Father   . Arthritis Father   . Healthy Sister   . Heart disease Brother   . Cancer Brother     colon ca  . Osteoporosis Sister   . Heart disease Brother   . Diabetes Brother   . Heart disease Brother   . Diabetes Brother   . Diabetes Brother   . Healthy Brother   . Healthy Brother   . Healthy Brother   . Cancer Maternal Uncle     blood condition  . Cancer Daughter     breast ca  . Cancer Cousin     NHL    Medications: Allergies as of 09/17/2016      Reactions   Levaquin [levofloxacin] Other (See Comments)   Ruptured tendon   Tape Other (See Comments)   PATIENT'S SKIN IS VERY, VERY THIN AND WILL TEAR AND BRUISE EASILY; PLEASE USE AN ALTERNATIVE (SOMETHING OTHER THAN TAPE)      Medication List       Accurate as of 09/17/16 10:28 AM. Always use your most recent med list.          acetaminophen 500 MG tablet Commonly known as:  TYLENOL Take 1,000 mg by mouth every 6 (six) hours as needed (pain).   amoxicillin-clavulanate 875-125 MG tablet Commonly known as:  AUGMENTIN Take 1 tablet by mouth 2 (two) times daily.   aspirin 81 MG EC tablet Take 1 tablet (81 mg total) by mouth daily.   cetirizine 10 MG tablet Commonly known as:  ZYRTEC Take 10 mg by mouth at bedtime.   clopidogrel 75 MG tablet Commonly known as:  PLAVIX Take 1 tablet (75 mg total) by mouth daily with breakfast.   clozapine 200 MG tablet Commonly known as:  CLOZARIL Take 300 mg by mouth at bedtime. ]   COLACE 100 MG capsule Generic drug:  docusate sodium Take 200 mg by mouth at bedtime.   Cranberry 250 MG Tabs Take 1 tablet by mouth at bedtime.   denosumab 60 MG/ML Soln injection Commonly known as:  PROLIA Inject 60 mg into the  skin every 6 (six) months. Administer in upper arm, thigh, or abdomen   diltiazem 180 MG 24 hr tablet Commonly known as:  CARDIZEM LA Take 180 mg by mouth daily with breakfast.   Fluticasone-Salmeterol 250-50 MCG/DOSE Aepb Commonly known as:  ADVAIR Inhale 1 puff into the lungs at bedtime.   guaiFENesin-dextromethorphan 100-10 MG/5ML syrup Commonly known as:  ROBITUSSIN DM Take 5 mLs by mouth every 4 (four) hours as needed for cough.   isosorbide mononitrate 60 MG 24 hr tablet Commonly known as:  IMDUR Take 1 tablet (60 mg total) by mouth daily.   levothyroxine 75 MCG tablet Commonly known as:  SYNTHROID, LEVOTHROID Take 75 mcg by mouth daily before breakfast.   metoprolol succinate 100 MG 24  hr tablet Commonly known as:  TOPROL-XL Take 100 mg by mouth daily with breakfast. Take with or immediately following a meal.   multivitamin with minerals tablet Take 1 tablet by mouth daily.   nitroGLYCERIN 0.4 MG SL tablet Commonly known as:  NITROSTAT Place 1 tablet (0.4 mg total) under the tongue every 5 (five) minutes as needed. For chest pain   pantoprazole 40 MG tablet Commonly known as:  PROTONIX Take 1 tablet (40 mg total) by mouth daily.   polyethylene glycol powder powder Commonly known as:  GLYCOLAX/MIRALAX Take 17 g by mouth daily. MIX AND DRINK   pravastatin 20 MG tablet Commonly known as:  PRAVACHOL Take 20 mg by mouth at bedtime.   predniSONE 10 MG tablet Commonly known as:  DELTASONE Take 10 mg by mouth daily with breakfast.   PROAIR HFA 108 (90 Base) MCG/ACT inhaler Generic drug:  albuterol Inhale 2 puffs into the lungs every 6 (six) hours as needed for wheezing or shortness of breath.   SPIRIVA RESPIMAT 2.5 MCG/ACT Aers Generic drug:  Tiotropium Bromide Monohydrate Inhale 2 puffs into the lungs every morning.   temazepam 7.5 MG capsule Commonly known as:  RESTORIL Take 1 capsule (7.5 mg total) by mouth at bedtime as needed for sleep.   traMADol 300  MG 24 hr tablet Commonly known as:  ULTRAM-ER Take 300 mg by mouth every morning.   traMADol 50 MG tablet Commonly known as:  ULTRAM Take 1 tablet (50 mg total) by mouth 2 (two) times daily as needed (for breakthrough pain).   Vitamin D (Ergocalciferol) 50000 units Caps capsule Commonly known as:  DRISDOL Take 1 capsule (50,000 Units total) by mouth every 7 (seven) days.       Immunizations: Immunization History  Administered Date(s) Administered  . H1N1 06/27/2008  . Influenza Split 03/24/2011, 02/28/2012  . Influenza Whole 03/06/2008, 02/09/2013  . Influenza-Unspecified 03/12/2016  . Pneumococcal Polysaccharide-23 03/14/2000, 09/20/2011  . Td 12/13/1998, 04/09/2010     Physical Exam:  Vitals:   09/17/16 1018  BP: 120/60  Pulse: 72  Resp: 18  Temp: (!) 96.8 F (36 C)  TempSrc: Oral  SpO2: 95%  Weight: 148 lb 1.6 oz (67.2 kg)  Height: 5\' 3"  (1.6 m)   Body mass index is 26.23 kg/m.  General- elderly female, well built, in no acute distress Head- normocephalic, atraumatic Nose- no maxillary or frontal sinus tenderness, no nasal discharge Throat- moist mucus membrane, normal oropharynx, has dentures  Eyes- PERRLA, EOMI, no pallor, no icterus, no discharge, normal conjunctiva, normal sclera Neck- no cervical lymphadenopathy Cardiovascular- normal s1,s2, no murmur Respiratory- bilateral Poor air movement, scattered rhonchi present, no wheeze, no crackles, no use of accessory muscles Abdomen- bowel sounds present, soft, non tender, no guarding or rigidity, distendend Musculoskeletal- able to move all 4 extremities, generalized weakness, on wheelchair, no leg edema Neurological- alert and oriented to person, place and time Skin- warm and dry, ecchymosis present, easy bruising, stage 2 PU sacrum Psychiatry- normal mood and affect    Labs reviewed: Basic Metabolic Panel:  Recent Labs  09/12/16 1307 09/13/16 0348 09/15/16 0340  NA 138 140 138  K 4.7 4.3 4.7    CL 104 110 107  CO2 26 23 25   GLUCOSE 196* 81 93  BUN 38* 38* 31*  CREATININE 2.01* 1.66* 1.50*  CALCIUM 9.8 8.3* 8.7*   Liver Function Tests:  Recent Labs  07/14/16 2204 09/12/16 1307 09/15/16 0340  AST 21 25 23   ALT 19 14  16  ALKPHOS 37* 38 29*  BILITOT 0.5 0.7 0.6  PROT 5.6* 6.8 5.9*  ALBUMIN 2.9* 3.6 3.0*   No results for input(s): LIPASE, AMYLASE in the last 8760 hours. No results for input(s): AMMONIA in the last 8760 hours. CBC:  Recent Labs  07/14/16 2204  09/12/16 1307 09/13/16 0348 09/15/16 0340  WBC 24.6*  < > 13.6* 9.6 5.5  NEUTROABS 23.0*  --  12.0* 6.9  --   HGB 10.2*  < > 11.1* 10.2* 9.4*  HCT 31.2*  < > 34.4* 30.8* 28.0*  MCV 94.0  < > 94.0 93.3 88.9  PLT 169  < > 168 135* 129*  < > = values in this interval not displayed. Cardiac Enzymes:  Recent Labs  09/12/16 1307  CKTOTAL 151   BNP: Invalid input(s): POCBNP CBG:  Recent Labs  07/19/16 0650 09/12/16 2033 09/13/16 1719  GLUCAP 107* 113* 174*    Radiological Exams: Dg Chest 1 View  Result Date: 09/12/2016 CLINICAL DATA:  Productive cough.  Fell last night. EXAM: CHEST 1 VIEW COMPARISON:  07/14/2016. FINDINGS: Borderline enlarged cardiac silhouette. Aortic calcification. Stable linear densities at the left lung base and small amount of left pleural thickening or fluid. Lower thoracic spine and bilateral shoulder degenerative changes. IMPRESSION: 1. Stable left basilar linear atelectasis or scarring and small amount of left pleural thickening or fluid. 2. Aortic atherosclerosis. Electronically Signed   By: Claudie Revering M.D.   On: 09/12/2016 14:16   Dg Pelvis 1-2 Views  Result Date: 09/12/2016 CLINICAL DATA:  Pelvic pain following a fall last night. EXAM: PELVIS - 1-2 VIEW COMPARISON:  Pelvis CT dated 09/25/2015 FINDINGS: No pelvic fracture or dislocation seen. Lower lumbar spine degenerative changes. IMPRESSION: No fracture. Electronically Signed   By: Claudie Revering M.D.   On: 09/12/2016  14:18   Ct Head Wo Contrast  Result Date: 09/12/2016 CLINICAL DATA:  Per EMS, pt is coming from home after experiencing a fall at home last night. Pt lives with her daughter and daughter stated pt did not hit her head when fall occurred. Daughter reports that pt had a fever and a productive cough last night and had increased weakness after the fall occurred. Pt usually ambulates independently but now requires assistance to get up out of the bed. Pt AO x4. EXAM: CT HEAD WITHOUT CONTRAST CT CERVICAL SPINE WITHOUT CONTRAST TECHNIQUE: Multidetector CT imaging of the head and cervical spine was performed following the standard protocol without intravenous contrast. Multiplanar CT image reconstructions of the cervical spine were also generated. COMPARISON:  07/14/2016 FINDINGS: CT HEAD FINDINGS Brain: No evidence of acute infarction, hemorrhage, hydrocephalus, extra-axial collection or mass lesion/mass effect. Age related volume loss. Mild chronic microvascular ischemic change. 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 Stable appearance from the prior exam. Vascular: No hyperdense vessel or unexpected calcification. Skull: Normal. Negative for fracture or focal lesion. Sinuses/Orbits: Visualized globes and orbits are unremarkable. Visualized sinuses and mastoid air cells are clear. Other: None. CT CERVICAL SPINE FINDINGS Alignment: Mild reversal of the normal cervical lordosis, apex at C4. No spondylolisthesis. Skull base and vertebrae: No acute fracture. No primary bone lesion or focal pathologic process. Soft tissues and spinal canal: No prevertebral fluid or swelling. No visible canal hematoma. Disc levels: Mild loss of disc height from C3-C4 through C5-C6. Endplate spurring noted from C4 through C7. Facet degenerative change noted most prominent on the right at C2-C3. No disc herniations. Upper chest: No masses.  No acute findings. Other: None.  IMPRESSION: HEAD CT:  No acute intracranial abnormalities.  No skull  fracture. CERVICAL CT:  No fracture or acute finding. Electronically Signed   By: Lajean Manes M.D.   On: 09/12/2016 14:29   Ct Cervical Spine Wo Contrast  Result Date: 09/12/2016 CLINICAL DATA:  Per EMS, pt is coming from home after experiencing a fall at home last night. Pt lives with her daughter and daughter stated pt did not hit her head when fall occurred. Daughter reports that pt had a fever and a productive cough last night and had increased weakness after the fall occurred. Pt usually ambulates independently but now requires assistance to get up out of the bed. Pt AO x4. EXAM: CT HEAD WITHOUT CONTRAST CT CERVICAL SPINE WITHOUT CONTRAST TECHNIQUE: Multidetector CT imaging of the head and cervical spine was performed following the standard protocol without intravenous contrast. Multiplanar CT image reconstructions of the cervical spine were also generated. COMPARISON:  07/14/2016 FINDINGS: CT HEAD FINDINGS Brain: No evidence of acute infarction, hemorrhage, hydrocephalus, extra-axial collection or mass lesion/mass effect. Age related volume loss. Mild chronic microvascular ischemic change. 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 Stable appearance from the prior exam. Vascular: No hyperdense vessel or unexpected calcification. Skull: Normal. Negative for fracture or focal lesion. Sinuses/Orbits: Visualized globes and orbits are unremarkable. Visualized sinuses and mastoid air cells are clear. Other: None. CT CERVICAL SPINE FINDINGS Alignment: Mild reversal of the normal cervical lordosis, apex at C4. No spondylolisthesis. Skull base and vertebrae: No acute fracture. No primary bone lesion or focal pathologic process. Soft tissues and spinal canal: No prevertebral fluid or swelling. No visible canal hematoma. Disc levels: Mild loss of disc height from C3-C4 through C5-C6. Endplate spurring noted from C4 through C7. Facet degenerative change noted most prominent on the right at C2-C3. No disc  herniations. Upper chest: No masses.  No acute findings. Other: None. IMPRESSION: HEAD CT:  No acute intracranial abnormalities.  No skull fracture. CERVICAL CT:  No fracture or acute finding. Electronically Signed   By: Lajean Manes M.D.   On: 09/12/2016 14:29   Dg Chest Port 1 View  Result Date: 09/15/2016 CLINICAL DATA:  Rhonchi. EXAM: PORTABLE CHEST 1 VIEW COMPARISON:  09/13/2016 . FINDINGS: Mediastinum hilar structures normal. Cardiomegaly with normal pulmonary vascularity. No focal infiltrate. Stable pleural thickening on the left consistent with scarring. No pneumothorax . IMPRESSION: 1.  Stable cardiomegaly No pulmonary venous congestion. 2. Stable left base pleuroparenchymal thickening consistent with scarring. No focal infiltrate identified. Electronically Signed   By: Marcello Moores  Register   On: 09/15/2016 11:04   Dg Chest Port 1 View  Result Date: 09/13/2016 CLINICAL DATA:  Abnormal breathing sounds. History of COPD, CHF, diabetes. EXAM: PORTABLE CHEST 1 VIEW COMPARISON:  Chest radiograph September 12, 2016 FINDINGS: The cardiac silhouette is mildly enlarged unchanged. Calcified aortic knob. Similar patchy LEFT lung base airspace opacity, blunted costophrenic angle. RIGHT lung is clear. No pneumothorax. Soft tissue planes and included osseous structures are nonsuspicious. IMPRESSION: Stable LEFT basilar airspace opacity with small LEFT pleural effusion versus pleural thickening. Electronically Signed   By: Elon Alas M.D.   On: 09/13/2016 01:58    Assessment/Plan  Generalized weakness From physical deconditioning. Will have her work with physical therapy and occupational therapy team to help with gait training and muscle strengthening exercises.fall precautions. Skin care. Encourage to be out of bed.   Left lobar pneumonia Continue and complete course of Augmentin 875 mg bid until 09/20/16. Provide incentive spirometer. Currently on Robitussin  5 ML every 4 hours as needed for cough. Change  this to 10 mL every 6 hours as needed and monitor  Influenza Completed course of tamiflu. Monitor clinically  E.coli UTI Maintain hydration and perineal hygiene, continue and complete course of augmentin 09/20/16  Stage 2 pressure ulcer to sacrum Wound care for now and pressure ulcer prophylaxis  ckd stage 4 Monitor bmp and check daily weight x 1 week, then 3 days a week  Anemia of chronic disease Monitor cbc  PMR Continue tramadol 300 mg in the morning and 50 mg bid as needed. Continue prednisone 10 mg daily.   Insomnia On restoril at bedtime as needed, monitor  CAD Chest pain free. Continue isosorbide mononitrate 60 mg daily, metoprolol succinate 100 mg daily, aspirin 81 mg daily with Plavix 75 mg daily and nitroglycerin on a needed basis. Continue pravastatin 20 mg daily  COPD Monitor her breathing. Continue spiriva and Advair with Pro Air on a needed basis. Monitor for early signs of COPD exacerbation. Monitor oxygen saturation.  Hypertension Monitor BP, continue cardizem 180 mg daily with metoprolol succinate and isosorbide, check bmp  Thrombocytopenia No bleeding reported.   Osteoporosis Continue Prolia injection every 6 months with vitamin D supplement 50,000 units once a week and monitor. Fall precautions with her on chronic prednisone and hx of osteoporosis.  GERD Stable, continue pantoprazole 40 mg daily  Hypothyroidism Continue levothyroxine 75 g daily and monitor  Constipation Continue MiraLAX on a daily basis with Colace 200 mg at bedtime and monitor    Goals of care: short term rehabilitation   Labs/tests ordered: cbc, bmp 09/20/16  Family/ staff Communication: reviewed care plan with patient and nursing supervisor  I have spent greater than 50 minutes for this encounter which includes reviewing hospital records, addressing above mentioned concerns, reviewing care plan with patient, answering patient's concerns and counseling her.     Blanchie Serve, MD Internal Medicine [facility] [address] [place], [place] [address] Cell Phone (Monday-Friday 8 am - 5 pm): [phone] On Call: [phone] and follow prompts after 5 pm and on weekends Office Phone: [phone] Office Fax: [phone]

## 2016-09-20 LAB — CBC AND DIFFERENTIAL
HEMATOCRIT: 29 % — AB (ref 36–46)
HEMOGLOBIN: 9.4 g/dL — AB (ref 12.0–16.0)
Neutrophils Absolute: 6 /uL
Platelets: 173 10*3/uL (ref 150–399)
WBC: 11.4 10^3/mL

## 2016-09-20 LAB — BASIC METABOLIC PANEL
BUN: 31 mg/dL — AB (ref 4–21)
Creatinine: 1.4 mg/dL — AB (ref 0.5–1.1)
Glucose: 79 mg/dL
Potassium: 4.7 mmol/L (ref 3.4–5.3)
SODIUM: 139 mmol/L (ref 137–147)

## 2016-09-21 ENCOUNTER — Encounter: Payer: Self-pay | Admitting: Podiatry

## 2016-09-21 ENCOUNTER — Ambulatory Visit (INDEPENDENT_AMBULATORY_CARE_PROVIDER_SITE_OTHER): Payer: Medicare Other | Admitting: Podiatry

## 2016-09-21 DIAGNOSIS — E1149 Type 2 diabetes mellitus with other diabetic neurological complication: Secondary | ICD-10-CM

## 2016-09-21 DIAGNOSIS — M79676 Pain in unspecified toe(s): Secondary | ICD-10-CM | POA: Diagnosis not present

## 2016-09-21 DIAGNOSIS — B351 Tinea unguium: Secondary | ICD-10-CM | POA: Diagnosis not present

## 2016-09-21 DIAGNOSIS — M79675 Pain in left toe(s): Secondary | ICD-10-CM | POA: Diagnosis not present

## 2016-09-21 DIAGNOSIS — M2041 Other hammer toe(s) (acquired), right foot: Secondary | ICD-10-CM

## 2016-09-21 DIAGNOSIS — Q828 Other specified congenital malformations of skin: Secondary | ICD-10-CM | POA: Diagnosis not present

## 2016-09-21 DIAGNOSIS — M2042 Other hammer toe(s) (acquired), left foot: Secondary | ICD-10-CM

## 2016-09-21 NOTE — Progress Notes (Signed)
She presents today as a type II diabetic requesting diabetic shoes and complaining of painful elongated toenails and porokeratosis.  Objective: Vital signs are stable she is alert and oriented 3 pulses are palpable. No open lesions or wounds are noted. She does have a decrease in sensorium per Semmes-Weinstein monofilament. Deep tendon reflexes are not elicitable and muscle strength is relatively normal and symmetrical bilateral. Toenails are long thick yellow dystrophic and clinically mycotic multiple areas of porokeratosis to the plantar aspect of the bilateral foot.  Assessment: Diabetes mellitus with diabetic peripheral neuropathy hammertoe deformities and porokeratosis.  Plan: Debridement of all reactive hyperkeratotic tissue today debridement of toenails 1 through 5 bilateral. Was initiated paperwork for diabetic shoes and she was measured for diabetic shoes today.

## 2016-09-27 ENCOUNTER — Telehealth: Payer: Self-pay | Admitting: Hematology and Oncology

## 2016-09-27 ENCOUNTER — Encounter: Payer: Self-pay | Admitting: Hematology and Oncology

## 2016-09-27 ENCOUNTER — Other Ambulatory Visit (HOSPITAL_BASED_OUTPATIENT_CLINIC_OR_DEPARTMENT_OTHER): Payer: Medicare Other

## 2016-09-27 ENCOUNTER — Other Ambulatory Visit: Payer: Self-pay | Admitting: Hematology and Oncology

## 2016-09-27 ENCOUNTER — Ambulatory Visit (HOSPITAL_BASED_OUTPATIENT_CLINIC_OR_DEPARTMENT_OTHER): Payer: Medicare Other | Admitting: Hematology and Oncology

## 2016-09-27 VITALS — BP 151/84 | HR 93 | Temp 98.5°F | Resp 18 | Ht 63.0 in | Wt 153.7 lb

## 2016-09-27 DIAGNOSIS — D631 Anemia in chronic kidney disease: Secondary | ICD-10-CM | POA: Diagnosis not present

## 2016-09-27 DIAGNOSIS — D72829 Elevated white blood cell count, unspecified: Secondary | ICD-10-CM

## 2016-09-27 DIAGNOSIS — D472 Monoclonal gammopathy: Secondary | ICD-10-CM | POA: Diagnosis not present

## 2016-09-27 DIAGNOSIS — N184 Chronic kidney disease, stage 4 (severe): Secondary | ICD-10-CM | POA: Diagnosis not present

## 2016-09-27 DIAGNOSIS — F259 Schizoaffective disorder, unspecified: Secondary | ICD-10-CM

## 2016-09-27 DIAGNOSIS — M353 Polymyalgia rheumatica: Secondary | ICD-10-CM | POA: Diagnosis not present

## 2016-09-27 DIAGNOSIS — R05 Cough: Secondary | ICD-10-CM

## 2016-09-27 DIAGNOSIS — R059 Cough, unspecified: Secondary | ICD-10-CM

## 2016-09-27 LAB — COMPREHENSIVE METABOLIC PANEL
ALBUMIN: 3.1 g/dL — AB (ref 3.5–5.0)
ALK PHOS: 42 U/L (ref 40–150)
ALT: 16 U/L (ref 0–55)
AST: 14 U/L (ref 5–34)
Anion Gap: 11 mEq/L (ref 3–11)
BILIRUBIN TOTAL: 0.33 mg/dL (ref 0.20–1.20)
BUN: 25.6 mg/dL (ref 7.0–26.0)
CALCIUM: 9.4 mg/dL (ref 8.4–10.4)
CO2: 24 mEq/L (ref 22–29)
CREATININE: 1.4 mg/dL — AB (ref 0.6–1.1)
Chloride: 107 mEq/L (ref 98–109)
EGFR: 35 mL/min/{1.73_m2} — ABNORMAL LOW (ref >90)
Glucose: 148 mg/dl — ABNORMAL HIGH (ref 70–140)
Potassium: 4.8 mEq/L (ref 3.5–5.1)
Sodium: 141 mEq/L (ref 136–145)
TOTAL PROTEIN: 6.4 g/dL (ref 6.4–8.3)

## 2016-09-27 LAB — CBC WITH DIFFERENTIAL/PLATELET
BASO%: 0.3 % (ref 0.0–2.0)
BASOS ABS: 0.1 10*3/uL (ref 0.0–0.1)
EOS ABS: 0.2 10*3/uL (ref 0.0–0.5)
EOS%: 0.8 % (ref 0.0–7.0)
HEMATOCRIT: 32.4 % — AB (ref 34.8–46.6)
HEMOGLOBIN: 10.9 g/dL — AB (ref 11.6–15.9)
LYMPH%: 13.7 % — AB (ref 14.0–49.7)
MCH: 30.6 pg (ref 25.1–34.0)
MCHC: 33.5 g/dL (ref 31.5–36.0)
MCV: 91.4 fL (ref 79.5–101.0)
MONO#: 1.6 10*3/uL — AB (ref 0.1–0.9)
MONO%: 6.9 % (ref 0.0–14.0)
NEUT#: 17.7 10*3/uL — ABNORMAL HIGH (ref 1.5–6.5)
NEUT%: 78.3 % — ABNORMAL HIGH (ref 38.4–76.8)
Platelets: 262 10*3/uL (ref 145–400)
RBC: 3.55 10*6/uL — ABNORMAL LOW (ref 3.70–5.45)
RDW: 14.4 % (ref 11.2–14.5)
WBC: 22.6 10*3/uL — ABNORMAL HIGH (ref 3.9–10.3)
lymph#: 3.1 10*3/uL (ref 0.9–3.3)

## 2016-09-27 MED ORDER — PREDNISONE 20 MG PO TABS: 20.0000 mg | ORAL_TABLET | Freq: Every day | ORAL | 0 refills | Status: DC

## 2016-09-27 MED ORDER — HYDROCODONE-HOMATROPINE 5-1.5 MG/5ML PO SYRP: 5.0000 mL | ORAL_SOLUTION | Freq: Three times a day (TID) | ORAL | 0 refills | Status: DC

## 2016-09-27 NOTE — Telephone Encounter (Signed)
Gave patient AVS and calender per 4/16 los.  

## 2016-09-27 NOTE — Assessment & Plan Note (Signed)
Clinically, she does not appear to have signs and symptoms to suggest multiple myeloma. 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 I would recommend rechecking her blood work and exam annually Repeat myeloma panel is pending I will review results with them in 2 weeks

## 2016-09-27 NOTE — Assessment & Plan Note (Signed)
she will continue current medical management. I recommend close follow-up with primary care doctor and nephrologist for medication adjustment.

## 2016-09-27 NOTE — Assessment & Plan Note (Signed)
She has chronic leukocytosis due to chronic prednisone therapy. She was recently hospitalized for infection/pneumonia Her white count again is high and she is coughing quite a bit On exam, she have signs and symptoms of COPD exacerbation with expiratory wheeze I recommend increasing the dose of prednisone to 20 mg daily along with conservative management with cough syrup medication I plan to see her back in 2 weeks with repeat chest x-ray and for further evaluation. Since she just completed multiple courses of antibiotic therapy, and she has no signs and symptoms of fever, I favor treating her conservatively the right now

## 2016-09-27 NOTE — Assessment & Plan Note (Signed)
She has diffuse pain, could be due to coughing/exacerbation of polymyalgia She is on chronic prednisone therapy She take tramadol long-term I recommend she continues the same treatment with tramadol but plan to give her some cough syrup which may help

## 2016-09-27 NOTE — Assessment & Plan Note (Signed)
This is likely anemia of chronic disease. The patient denies recent history of bleeding such as epistaxis, hematuria or hematochezia. She is asymptomatic from the anemia. We will observe for now.  She will unlikely benefit from oral iron supplement This has improved recently

## 2016-09-27 NOTE — Progress Notes (Signed)
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 Patient herself appears to have some memory deficit. A lot of history was obtained through a review of chart and collaboration of history with her daughter She denies history of abnormal bone pain or bone fracture. The patient had history of polymyalgia rheumatica and had been on intermittent high-dose prednisone therapy in the past, currently on a slow prednisone taper. Patient denies history of recurrent infection or atypical infections such as shingles of meningitis. Denies chills, night sweats, anorexia or abnormal weight loss. She had chronic bruising from antiplatelet agent and prednisone therapy.   INTERVAL HISTORY: Please see below for problem oriented charting. She is seen today with her daughter History from the patient is not reliable due to her schizoaffective disorder I review her charts extensively The patient was referred back here by her primary care doctor after repeat blood work in February/March show persistent leukocytosis She follows up with me on a yearly basis around July for MGUS Her chronic leukocytosis is likely due to chronic prednisone therapy, on background history of polymyalgia rheumatica The patient was recently hospitalized on multiple occasions in February and April for influenza and pneumonia. She is currently in skilled nursing facility According to the daughter, the staff there were not giving her medications as directed The patient has complained of significant pain. She was not getting her usual doses of tramadol because of recent altered mental status, thought to be related to overdose of medications Her  daughter also noted she has been coughing extensively and the coughing has caused some rib pain and generalized discomfort The patient is requested to be discharged from the skilled facility so there were no reported fever or chills. She has completed multiple courses of antiviral and antibiotic therapy She also have mild sacral decubitus ulcer, slowly improving on conservative management  REVIEW OF SYSTEMS:   Constitutional: Denies fevers, chills or abnormal weight loss Eyes: Denies blurriness of vision Ears, nose, mouth, throat, and face: Denies mucositis or sore throat Cardiovascular: Denies palpitation, chest discomfort or lower extremity swelling Gastrointestinal:  Denies nausea, heartburn or change in bowel habits Skin: Denies abnormal skin rashes Lymphatics: Denies new lymphadenopathy or easy bruising Neurological:Denies numbness, tingling or new weaknesses Behavioral/Psych: Mood is stable, no new changes  All other systems were reviewed with the patient and are negative.  I have reviewed the past medical history, past surgical history, social history and family history with the patient and they are unchanged from previous note.  ALLERGIES:  is allergic to levaquin [levofloxacin] and tape.  MEDICATIONS:  Current Outpatient Prescriptions  Medication Sig Dispense Refill  . acetaminophen (TYLENOL) 500 MG tablet Take 1,000 mg by mouth every 6 (six) hours as needed (pain).     Marland Kitchen albuterol (PROAIR HFA) 108 (90 Base) MCG/ACT inhaler Inhale 2 puffs into the lungs every 6 (six) hours as needed for wheezing or shortness of breath.    Marland Kitchen aspirin EC 81 MG EC tablet Take 1 tablet (81 mg total) by mouth daily. 30 tablet 0  . cetirizine (ZYRTEC) 10 MG tablet Take 10 mg by mouth at bedtime.     . clopidogrel (PLAVIX) 75 MG tablet Take 1 tablet (75 mg total) by mouth daily with breakfast. 30  tablet 0  . clozapine (CLOZARIL) 200 MG tablet Take 300 mg by mouth at bedtime. ]    . Cranberry 250 MG  TABS Take 1 tablet by mouth at bedtime.    Marland Kitchen denosumab (PROLIA) 60 MG/ML SOLN injection Inject 60 mg into the skin every 6 (six) months. Administer in upper arm, thigh, or abdomen    . diltiazem (CARDIZEM LA) 180 MG 24 hr tablet Take 180 mg by mouth daily with breakfast.  0  . docusate sodium (COLACE) 100 MG capsule Take 200 mg by mouth at bedtime.  10 capsule 0  . Fluticasone-Salmeterol (ADVAIR) 250-50 MCG/DOSE AEPB Inhale 1 puff into the lungs at bedtime.    . isosorbide mononitrate (IMDUR) 60 MG 24 hr tablet Take 1 tablet (60 mg total) by mouth daily. 30 tablet 6  . levothyroxine (SYNTHROID, LEVOTHROID) 75 MCG tablet Take 75 mcg by mouth daily before breakfast.    . metoprolol succinate (TOPROL-XL) 100 MG 24 hr tablet Take 100 mg by mouth daily with breakfast. Take with or immediately following a meal.    . Multiple Vitamins-Minerals (MULTIVITAMIN WITH MINERALS) tablet Take 1 tablet by mouth daily.    . pantoprazole (PROTONIX) 40 MG tablet Take 1 tablet (40 mg total) by mouth daily. 30 tablet 11  . polyethylene glycol powder (GLYCOLAX/MIRALAX) powder Take 17 g by mouth daily. [place]    . pravastatin (PRAVACHOL) 20 MG tablet Take 20 mg by mouth at bedtime.    . predniSONE (DELTASONE) 10 MG tablet Take 10 mg by mouth daily with breakfast.    . temazepam (RESTORIL) 7.5 MG capsule Take 1 capsule (7.5 mg total) by mouth at bedtime as needed for sleep. 5 capsule 0  . Tiotropium Bromide Monohydrate (SPIRIVA RESPIMAT) 2.5 MCG/ACT AERS Inhale 2 puffs into the lungs every morning.     . traMADol (ULTRAM) 50 MG tablet Take 1 tablet (50 mg total) by mouth 2 (two) times daily as needed (for breakthrough pain). 10 tablet 0  . traMADol (ULTRAM-ER) 300 MG 24 hr tablet Take 300 mg by mouth every morning.    . Vitamin D, Ergocalciferol, (DRISDOL) 50000 UNITS CAPS capsule Take 1 capsule (50,000 Units total) by mouth every 7 (seven) days. 4 capsule 0  . amoxicillin-clavulanate (AUGMENTIN) 875-125 MG tablet  Take 1 tablet by mouth 2 (two) times daily. (Patient not taking: Reported on 09/27/2016) 8 tablet 0  . guaiFENesin-dextromethorphan (ROBITUSSIN DM) 100-10 MG/5ML syrup Take 5 mLs by mouth every 4 (four) hours as needed for cough. (Patient not taking: Reported on 09/27/2016) 118 mL 0  . HYDROcodone-homatropine (HYCODAN) 5-1.5 MG/5ML syrup Take 5 mLs by mouth 3 (three) times daily. 120 mL 0  . nitroGLYCERIN (NITROSTAT) 0.4 MG SL tablet Place 1 tablet (0.4 mg total) under the tongue every 5 (five) minutes as needed. For chest pain (Patient not taking: Reported on 09/27/2016) 25 tablet 3  . predniSONE (DELTASONE) 20 MG tablet Take 1 tablet (20 mg total) by mouth daily with breakfast. 14 tablet 0   No current facility-administered medications for this visit.     PHYSICAL EXAMINATION: ECOG PERFORMANCE STATUS: 1 - Symptomatic but completely ambulatory  Vitals:   09/27/16 1102  BP: (!) 151/84  Pulse: 93  Resp: 18  Temp: 98.5 F (36.9 C)   Filed Weights   09/27/16 1102  Weight: 153 lb 11.2 oz (69.7 kg)    GENERAL:alert, no distress and comfortable.  She is mildly cushingoid SKIN: skin color, texture, turgor are normal, no  rashes or significant lesions EYES: normal, Conjunctiva are pink and non-injected, sclera clear OROPHARYNX:no exudate, no erythema and lips, buccal mucosa, and tongue normal  NECK: supple, thyroid normal size, non-tender, without nodularity LYMPH:  no palpable lymphadenopathy in the cervical, axillary or inguinal LUNGS: Mildly increased work of breathing with frequent coughing.  Expiratory wheezes bilateral lungs are noted HEART: regular rate & rhythm and no murmurs and no lower extremity edema ABDOMEN:abdomen soft, non-tender and normal bowel sounds Musculoskeletal:no cyanosis of digits and no clubbing  NEURO: alert & oriented x 3 with fluent speech, no focal motor/sensory deficits  LABORATORY DATA:  I have reviewed the data as listed    Component Value Date/Time   NA  141 09/27/2016 1047   K 4.8 09/27/2016 1047   CL 107 09/15/2016 0340   CO2 24 09/27/2016 1047   GLUCOSE 148 (H) 09/27/2016 1047   BUN 25.6 09/27/2016 1047   CREATININE 1.4 (H) 09/27/2016 1047   CALCIUM 9.4 09/27/2016 1047   PROT 6.4 09/27/2016 1047   ALBUMIN 3.1 (L) 09/27/2016 1047   AST 14 09/27/2016 1047   ALT 16 09/27/2016 1047   ALKPHOS 42 09/27/2016 1047   BILITOT 0.33 09/27/2016 1047   GFRNONAA 32 (L) 09/15/2016 0340   GFRAA 38 (L) 09/15/2016 0340    No results found for: SPEP, UPEP  Lab Results  Component Value Date   WBC 22.6 (H) 09/27/2016   NEUTROABS 17.7 (H) 09/27/2016   HGB 10.9 (L) 09/27/2016   HCT 32.4 (L) 09/27/2016   MCV 91.4 09/27/2016   PLT 262 09/27/2016      Chemistry      Component Value Date/Time   NA 141 09/27/2016 1047   K 4.8 09/27/2016 1047   CL 107 09/15/2016 0340   CO2 24 09/27/2016 1047   BUN 25.6 09/27/2016 1047   CREATININE 1.4 (H) 09/27/2016 1047   GLU 77 06/21/2012      Component Value Date/Time   CALCIUM 9.4 09/27/2016 1047   ALKPHOS 42 09/27/2016 1047   AST 14 09/27/2016 1047   ALT 16 09/27/2016 1047   BILITOT 0.33 09/27/2016 1047       RADIOGRAPHIC STUDIES: I have personally reviewed the radiological images as listed and agreed with the findings in the report. Dg Chest 1 View  Result Date: 09/12/2016 CLINICAL DATA:  Productive cough.  Fell last night. EXAM: CHEST 1 VIEW COMPARISON:  07/14/2016. FINDINGS: Borderline enlarged cardiac silhouette. Aortic calcification. Stable linear densities at the left lung base and small amount of left pleural thickening or fluid. Lower thoracic spine and bilateral shoulder degenerative changes. IMPRESSION: 1. Stable left basilar linear atelectasis or scarring and small amount of left pleural thickening or fluid. 2. Aortic atherosclerosis. Electronically Signed   By: Claudie Revering M.D.   On: 09/12/2016 14:16   Dg Pelvis 1-2 Views  Result Date: 09/12/2016 CLINICAL DATA:  Pelvic pain following a  fall last night. EXAM: PELVIS - 1-2 VIEW COMPARISON:  Pelvis CT dated 09/25/2015 FINDINGS: No pelvic fracture or dislocation seen. Lower lumbar spine degenerative changes. IMPRESSION: No fracture. Electronically Signed   By: Claudie Revering M.D.   On: 09/12/2016 14:18   Ct Head Wo Contrast  Result Date: 09/12/2016 CLINICAL DATA:  Per EMS, pt is coming from home after experiencing a fall at home last night. Pt lives with her daughter and daughter stated pt did not hit her head when fall occurred. Daughter reports that pt had a fever and a productive cough last night and  had increased weakness after the fall occurred. Pt usually ambulates independently but now requires assistance to get up out of the bed. Pt AO x4. EXAM: CT HEAD WITHOUT CONTRAST CT CERVICAL SPINE WITHOUT CONTRAST TECHNIQUE: Multidetector CT imaging of the head and cervical spine was performed following the standard protocol without intravenous contrast. Multiplanar CT image reconstructions of the cervical spine were also generated. COMPARISON:  07/14/2016 FINDINGS: CT HEAD FINDINGS Brain: No evidence of acute infarction, hemorrhage, hydrocephalus, extra-axial collection or mass lesion/mass effect. Age related volume loss. Mild chronic microvascular ischemic change. 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 Stable appearance from the prior exam. Vascular: No hyperdense vessel or unexpected calcification. Skull: Normal. Negative for fracture or focal lesion. Sinuses/Orbits: Visualized globes and orbits are unremarkable. Visualized sinuses and mastoid air cells are clear. Other: None. CT CERVICAL SPINE FINDINGS Alignment: Mild reversal of the normal cervical lordosis, apex at C4. No spondylolisthesis. Skull base and vertebrae: No acute fracture. No primary bone lesion or focal pathologic process. Soft tissues and spinal canal: No prevertebral fluid or swelling. No visible canal hematoma. Disc levels: Mild loss of disc height from C3-C4 through C5-C6.  Endplate spurring noted from C4 through C7. Facet degenerative change noted most prominent on the right at C2-C3. No disc herniations. Upper chest: No masses.  No acute findings. Other: None. IMPRESSION: HEAD CT:  No acute intracranial abnormalities.  No skull fracture. CERVICAL CT:  No fracture or acute finding. Electronically Signed   By: Lajean Manes M.D.   On: 09/12/2016 14:29   Ct Cervical Spine Wo Contrast  Result Date: 09/12/2016 CLINICAL DATA:  Per EMS, pt is coming from home after experiencing a fall at home last night. Pt lives with her daughter and daughter stated pt did not hit her head when fall occurred. Daughter reports that pt had a fever and a productive cough last night and had increased weakness after the fall occurred. Pt usually ambulates independently but now requires assistance to get up out of the bed. Pt AO x4. EXAM: CT HEAD WITHOUT CONTRAST CT CERVICAL SPINE WITHOUT CONTRAST TECHNIQUE: Multidetector CT imaging of the head and cervical spine was performed following the standard protocol without intravenous contrast. Multiplanar CT image reconstructions of the cervical spine were also generated. COMPARISON:  07/14/2016 FINDINGS: CT HEAD FINDINGS Brain: No evidence of acute infarction, hemorrhage, hydrocephalus, extra-axial collection or mass lesion/mass effect. Age related volume loss. Mild chronic microvascular ischemic change. 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 Stable appearance from the prior exam. Vascular: No hyperdense vessel or unexpected calcification. Skull: Normal. Negative for fracture or focal lesion. Sinuses/Orbits: Visualized globes and orbits are unremarkable. Visualized sinuses and mastoid air cells are clear. Other: None. CT CERVICAL SPINE FINDINGS Alignment: Mild reversal of the normal cervical lordosis, apex at C4. No spondylolisthesis. Skull base and vertebrae: No acute fracture. No primary bone lesion or focal pathologic process. Soft tissues and spinal  canal: No prevertebral fluid or swelling. No visible canal hematoma. Disc levels: Mild loss of disc height from C3-C4 through C5-C6. Endplate spurring noted from C4 through C7. Facet degenerative change noted most prominent on the right at C2-C3. No disc herniations. Upper chest: No masses.  No acute findings. Other: None. IMPRESSION: HEAD CT:  No acute intracranial abnormalities.  No skull fracture. CERVICAL CT:  No fracture or acute finding. Electronically Signed   By: Lajean Manes M.D.   On: 09/12/2016 14:29   Dg Chest Port 1 View  Result Date: 09/15/2016 CLINICAL DATA:  [place]. EXAM:  PORTABLE CHEST 1 VIEW COMPARISON:  09/13/2016 . FINDINGS: Mediastinum hilar structures normal. Cardiomegaly with normal pulmonary vascularity. No focal infiltrate. Stable pleural thickening on the left consistent with scarring. No pneumothorax . IMPRESSION: 1.  Stable cardiomegaly No pulmonary venous congestion. 2. Stable left base pleuroparenchymal thickening consistent with scarring. No focal infiltrate identified. Electronically Signed   By: Marcello Moores  Register   On: 09/15/2016 11:04   Dg Chest Port 1 View  Result Date: 09/13/2016 CLINICAL DATA:  Abnormal breathing sounds. History of COPD, CHF, diabetes. EXAM: PORTABLE CHEST 1 VIEW COMPARISON:  Chest radiograph September 12, 2016 FINDINGS: The cardiac silhouette is mildly enlarged unchanged. Calcified aortic knob. Similar patchy LEFT lung base airspace opacity, blunted costophrenic angle. RIGHT lung is clear. No pneumothorax. Soft tissue planes and included osseous structures are nonsuspicious. IMPRESSION: Stable LEFT basilar airspace opacity with small LEFT pleural effusion versus pleural thickening. Electronically Signed   By: Elon Alas M.D.   On: 09/13/2016 01:58    ASSESSMENT & PLAN:  MGUS (monoclonal gammopathy of unknown significance) Clinically, she does not appear to have signs and symptoms to suggest multiple myeloma. The anemia and chronic kidney disease  are unrelated to MGUS. I would recommend rechecking her blood work and exam annually Repeat myeloma panel is pending I will review results with them in 2 weeks  Leukocytosis She has chronic leukocytosis due to chronic prednisone therapy. She was recently hospitalized for infection/pneumonia Her white count again is high and she is coughing quite a bit On exam, she have signs and symptoms of COPD exacerbation with expiratory wheeze I recommend increasing the dose of prednisone to 20 mg daily along with conservative management with cough syrup medication I plan to see her back in 2 weeks with repeat chest x-ray and for further evaluation. Since she just completed multiple courses of antibiotic therapy, and she has no signs and symptoms of fever, I favor treating her conservatively the right now  Polymyalgia rheumatica ([place]) She has diffuse pain, could be due to coughing/exacerbation of polymyalgia She is on chronic prednisone therapy She take tramadol long-term I recommend she continues the same treatment with tramadol but plan to give her some cough syrup which may help  CKD stage 4, GFR 15-29 ml/min  she will continue current medical management. I recommend close follow-up with primary care doctor and nephrologist for medication adjustment.   Anemia in chronic renal disease This is likely anemia of chronic disease. The patient denies recent history of bleeding such as epistaxis, hematuria or hematochezia. She is asymptomatic from the anemia. We will observe for now.  She will unlikely benefit from oral iron supplement This has improved recently   Orders Placed This Encounter  Procedures  . DG Chest 2 View    Standing Status:   Future    Standing Expiration Date:   11/01/2017    Order Specific Question:   Reason for exam:    Answer:   cough, COPD    Order Specific Question:   Preferred imaging location?    Answer:   [facility]  . CBC with Differential/Platelet     Standing Status:   Future    Standing Expiration Date:   11/01/2017  . Sedimentation rate    Standing Status:   Future    Standing Expiration Date:   11/01/2017   All questions were answered. The patient knows to call the clinic with any problems, questions or concerns. No barriers to learning was detected. I spent 25 minutes counseling  the patient face to face. The total time spent in the appointment was 40 minutes and more than 50% was on counseling and review of test results     Heath Lark, MD 09/27/2016 1:24 PM

## 2016-09-28 LAB — KAPPA/LAMBDA LIGHT CHAINS
IG LAMBDA FREE LIGHT CHAIN: 24.6 mg/L (ref 5.7–26.3)
Ig Kappa Free Light Chain: 26.7 mg/L — ABNORMAL HIGH (ref 3.3–19.4)
KAPPA/LAMBDA FLC RATIO: 1.09 (ref 0.26–1.65)

## 2016-09-29 ENCOUNTER — Non-Acute Institutional Stay (SKILLED_NURSING_FACILITY): Payer: Medicare Other | Admitting: Adult Health

## 2016-09-29 ENCOUNTER — Encounter: Payer: Self-pay | Admitting: Adult Health

## 2016-09-29 DIAGNOSIS — E039 Hypothyroidism, unspecified: Secondary | ICD-10-CM

## 2016-09-29 DIAGNOSIS — N184 Chronic kidney disease, stage 4 (severe): Secondary | ICD-10-CM

## 2016-09-29 DIAGNOSIS — R531 Weakness: Secondary | ICD-10-CM | POA: Diagnosis not present

## 2016-09-29 DIAGNOSIS — D638 Anemia in other chronic diseases classified elsewhere: Secondary | ICD-10-CM

## 2016-09-29 DIAGNOSIS — J1100 Influenza due to unidentified influenza virus with unspecified type of pneumonia: Secondary | ICD-10-CM | POA: Diagnosis not present

## 2016-09-29 DIAGNOSIS — K219 Gastro-esophageal reflux disease without esophagitis: Secondary | ICD-10-CM | POA: Diagnosis not present

## 2016-09-29 DIAGNOSIS — D472 Monoclonal gammopathy: Secondary | ICD-10-CM

## 2016-09-29 DIAGNOSIS — J111 Influenza due to unidentified influenza virus with other respiratory manifestations: Secondary | ICD-10-CM | POA: Diagnosis not present

## 2016-09-29 DIAGNOSIS — I2510 Atherosclerotic heart disease of native coronary artery without angina pectoris: Secondary | ICD-10-CM | POA: Diagnosis not present

## 2016-09-29 DIAGNOSIS — J449 Chronic obstructive pulmonary disease, unspecified: Secondary | ICD-10-CM

## 2016-09-29 DIAGNOSIS — E785 Hyperlipidemia, unspecified: Secondary | ICD-10-CM

## 2016-09-29 DIAGNOSIS — M818 Other osteoporosis without current pathological fracture: Secondary | ICD-10-CM

## 2016-09-29 DIAGNOSIS — I10 Essential (primary) hypertension: Secondary | ICD-10-CM

## 2016-09-29 DIAGNOSIS — D72829 Elevated white blood cell count, unspecified: Secondary | ICD-10-CM

## 2016-09-29 DIAGNOSIS — K5901 Slow transit constipation: Secondary | ICD-10-CM

## 2016-09-29 DIAGNOSIS — M353 Polymyalgia rheumatica: Secondary | ICD-10-CM | POA: Diagnosis not present

## 2016-09-29 DIAGNOSIS — N390 Urinary tract infection, site not specified: Secondary | ICD-10-CM

## 2016-09-29 DIAGNOSIS — F259 Schizoaffective disorder, unspecified: Secondary | ICD-10-CM

## 2016-09-29 DIAGNOSIS — B9620 Unspecified Escherichia coli [E. coli] as the cause of diseases classified elsewhere: Secondary | ICD-10-CM

## 2016-09-29 NOTE — Progress Notes (Signed)
DATE:  09/29/2016   MRN:  [id number]  BIRTHDAY: [birth year]  Facility:  Nursing Home Location:  [place] Room Number: 102-P  LEVEL OF CARE:  SNF (31)  Contact Information    Name Gillham Daughter [phone]  [phone]   Lucas,Richard Other [phone]  [phone]       Code Status History    Date Active Date Inactive Code Status Order ID Comments User Context   09/12/2016  3:10 PM 09/16/2016  8:16 PM Full Code [id number]  [place], DO ED   09/12/2016  3:10 PM 09/12/2016  3:10 PM Full Code [id number]  [place], DO ED   07/14/2016  8:48 PM 07/19/2016  7:40 PM Full Code [id number]  Robbie Lis, MD ED   08/11/2015  2:26 AM 08/14/2015  7:56 PM Full Code [id number]  Rise Patience, MD Inpatient   08/11/2015  1:26 AM 08/11/2015  2:26 AM Full Code [id number]  Rise Patience, MD Inpatient   08/11/2015  1:04 AM 08/11/2015  1:26 AM DNR [id number]  Rise Patience, MD Inpatient   10/16/2013  8:14 PM 10/18/2013  8:21 PM Full Code [id number]  Shanda Howells, MD ED   07/14/2012  9:04 AM 07/15/2012  3:17 PM Full Code [id number]  Sharon [address], MD Inpatient       Chief Complaint  Patient presents with  . Discharge Note    HISTORY OF PRESENT ILLNESS:  This is a 20-YO female who is for  discharge home with home health PT and PT through [facility] on 10/01/2016.   She has been admitted to [place] on 09/16/16 from [facility] with hospitalization dates 09/12/16 to 09/16/16 with sepsis and acute respiratory failure. She was treated for left-sided pneumonia, influenza and e. Coli UTI. She was given IV fluids, IV antibiotic, Tamiflu and BiPAP. She has PMH of COPD, hypertension, CAD, CKD stage IV, GERD and PMR on chronic Prednisone.  Patient was admitted to this facility for short-term rehabilitation after the patient's recent hospitalization.  Patient has completed SNF rehabilitation and therapy  has cleared the patient for discharge.    PAST MEDICAL HISTORY:  Past Medical History:  Diagnosis Date  . AKI (acute kidney injury) ([place])   . Anemia in chronic renal disease   . CAD (coronary artery disease) 2006   Taxus stent of mid RCA. negative myoview 2014 and normal LV function  . CKD (chronic kidney disease) stage 4, GFR 15-29 ml/min (HCC)   . Complication of anesthesia   . COPD (chronic obstructive pulmonary disease) ([place])   . Diabetes mellitus   . Diastolic heart failure, NYHA class 1 ([place])   . Dyslipidemia   . Dysuria 01/06/2015  . Frozen shoulder    right  . Gastroesophageal reflux   . HAP (hospital-acquired pneumonia)   . Headache   . Hyperkalemia 01/01/2016  . Hypothyroidism   . 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 Obesity   . Pneumonia 06/2016  . Polymyalgia rheumatica ([place])   . PONV (postoperative nausea and vomiting)   . Renal insufficiency   . Respiratory distress   . Schizoaffective disorder ([place])   . Sepsis due to pneumonia ([place])   . Shortness of breath   . Stroke [facility])      CURRENT MEDICATIONS: Reviewed  Patient's Medications  New Prescriptions   No medications on file  Previous Medications   ACETAMINOPHEN (  TYLENOL) 500 MG TABLET    Take 1,000 mg by mouth every 6 (six) hours as needed (pain).    ALBUTEROL (PROAIR HFA) 108 (90 BASE) MCG/ACT INHALER    Inhale 2 puffs into the lungs every 6 (six) hours as needed for wheezing or shortness of breath.   ASPIRIN EC 81 MG EC TABLET    Take 1 tablet (81 mg total) by mouth daily.   CETIRIZINE (ZYRTEC) 10 MG TABLET    Take 10 mg by mouth at bedtime.    CLOPIDOGREL (PLAVIX) 75 MG TABLET    Take 1 tablet (75 mg total) by mouth daily with breakfast.   CLOZAPINE (CLOZARIL) 200 MG TABLET    Take 300 mg by mouth at bedtime. ]   CRANBERRY 250 MG TABS    Take 1 tablet by mouth at bedtime.   DENOSUMAB (PROLIA) 60 MG/ML SOLN INJECTION    Inject 60 mg into the skin every 6 (six) months.  Administer in upper arm, thigh, or abdomen   DEXTROMETHORPHAN-GUAIFENESIN (ROBITUSSIN-DM) 10-100 MG/5ML LIQUID    Take 10 mLs by mouth every 6 (six) hours as needed for cough.   DILTIAZEM (CARDIZEM LA) 180 MG 24 HR TABLET    Take 180 mg by mouth daily with breakfast.   DOCUSATE SODIUM (COLACE) 100 MG CAPSULE    Take 200 mg by mouth at bedtime.    FLUTICASONE-SALMETEROL (ADVAIR) 250-50 MCG/DOSE AEPB    Inhale 1 puff into the lungs at bedtime.   HYDROCODONE-HOMATROPINE (HYCODAN) 5-1.5 MG/5ML SYRUP    Take 5 mLs by mouth 3 (three) times daily.   HYDROCORTISONE (GERHARDT'S BUTT CREAM) CREA    Apply 1 application topically 3 (three) times daily. Apply to sacral region every shift for stage 2 pressure injury.   ISOSORBIDE MONONITRATE (IMDUR) 60 MG 24 HR TABLET    Take 1 tablet (60 mg total) by mouth daily.   LEVOTHYROXINE (SYNTHROID, LEVOTHROID) 75 MCG TABLET    Take 75 mcg by mouth daily before breakfast.   METOPROLOL SUCCINATE (TOPROL-XL) 100 MG 24 HR TABLET    Take 100 mg by mouth daily with breakfast. Take with or immediately following a meal.   MULTIPLE VITAMINS-MINERALS (MULTIVITAMIN WITH MINERALS) TABLET    Take 1 tablet by mouth daily.   NITROGLYCERIN (NITROSTAT) 0.4 MG SL TABLET    Place 1 tablet (0.4 mg total) under the tongue every 5 (five) minutes as needed. For chest pain   PANTOPRAZOLE (PROTONIX) 40 MG TABLET    Take 1 tablet (40 mg total) by mouth daily.   POLYETHYLENE GLYCOL POWDER (GLYCOLAX/MIRALAX) POWDER    Take 17 g by mouth daily. MIX AND DRINK   PRAVASTATIN (PRAVACHOL) 20 MG TABLET    Take 20 mg by mouth at bedtime.   PREDNISONE (DELTASONE) 20 MG TABLET    Take 1 tablet (20 mg total) by mouth daily with breakfast.   TIOTROPIUM BROMIDE MONOHYDRATE (SPIRIVA RESPIMAT) 2.5 MCG/ACT AERS    Inhale 2 puffs into the lungs every morning.    TRAMADOL (ULTRAM) 50 MG TABLET    Take 1 tablet (50 mg total) by mouth 2 (two) times daily as needed (for breakthrough pain).   TRAMADOL (ULTRAM) 50 MG  TABLET    Take 100 mg by mouth 2 (two) times daily as needed.   VITAMIN D, ERGOCALCIFEROL, (DRISDOL) 50000 UNITS CAPS CAPSULE    Take 1 capsule (50,000 Units total) by mouth every 7 (seven) days.  Modified Medications   No medications on file  Discontinued  Medications   AMOXICILLIN-CLAVULANATE (AUGMENTIN) 875-125 MG TABLET    Take 1 tablet by mouth 2 (two) times daily.   GUAIFENESIN-DEXTROMETHORPHAN (ROBITUSSIN DM) 100-10 MG/5ML SYRUP    Take 5 mLs by mouth every 4 (four) hours as needed for cough.   PREDNISONE (DELTASONE) 10 MG TABLET    Take 10 mg by mouth daily with breakfast.   TEMAZEPAM (RESTORIL) 7.5 MG CAPSULE    Take 1 capsule (7.5 mg total) by mouth at bedtime as needed for sleep.   TRAMADOL (ULTRAM-ER) 300 MG 24 HR TABLET    Take 300 mg by mouth every morning.     Allergies  Allergen Reactions  . Levaquin [Levofloxacin] Other (See Comments)    Ruptured tendon  . Tape Other (See Comments)    PATIENT'S SKIN IS VERY, VERY THIN AND WILL TEAR AND BRUISE EASILY; PLEASE USE AN ALTERNATIVE (SOMETHING OTHER THAN TAPE)     REVIEW OF SYSTEMS:  GENERAL: no change in appetite, no fatigue, no weight changes, no fever, chills or weakness EYES: Denies change in vision, dry eyes, eye pain, itching or discharge EARS: Denies change in hearing, ringing in ears, or earache NOSE: Denies nasal congestion or epistaxis MOUTH and THROAT: Denies oral discomfort, gingival pain or bleeding, pain from teeth or hoarseness   RESPIRATORY: no SOB, DOE, wheezing, hemoptysis, +cough CARDIAC: no chest pain, edema or palpitations GI: no abdominal pain, diarrhea, constipation, heart burn, nausea or vomiting GU: Denies dysuria, frequency, hematuria, incontinence, or discharge PSYCHIATRIC: Denies feeling of depression or anxiety. No report of hallucinations, insomnia, paranoia, or agitation    PHYSICAL EXAMINATION  GENERAL APPEARANCE: Well nourished. In no acute distress. Normal body habitus SKIN:  Skin is  warm and dry.  HEAD: Normal in size and contour. No evidence of trauma EYES: Lids open and close normally. No blepharitis, entropion or ectropion. PERRL. Conjunctivae are clear and sclerae are white. Lenses are without opacity EARS: Pinnae are normal. Patient hears normal voice tunes of the examiner MOUTH and THROAT: Lips are without lesions. Oral mucosa is moist and without lesions. Tongue is normal in shape, size, and color and without lesions NECK: supple, trachea midline, no neck masses, no thyroid tenderness, no thyromegaly LYMPHATICS: no LAN in the neck, no supraclavicular LAN RESPIRATORY: breathing is even & unlabored, BS CTAB CARDIAC: RRR, no murmur,no extra heart sounds, no edema GI: abdomen soft, normal BS, no masses, no tenderness, no hepatomegaly, no splenomegaly EXTREMITIES:  Able to move X 4 extremities PSYCHIATRIC: Alert and oriented X 3. Affect and behavior are appropriate   LABS/RADIOLOGY: Labs reviewed: Basic Metabolic Panel:  Recent Labs  09/12/16 1307 09/13/16 0348 09/15/16 0340 09/27/16 1047  NA 138 140 138 141  K 4.7 4.3 4.7 4.8  CL 104 110 107  --   CO2 26 23 25 24   GLUCOSE 196* 81 93 148*  BUN 38* 38* 31* 25.6  CREATININE 2.01* 1.66* 1.50* 1.4*  CALCIUM 9.8 8.3* 8.7* 9.4   Liver Function Tests:  Recent Labs  09/12/16 1307 09/15/16 0340 09/27/16 1047  AST 25 23 14   ALT 14 16 16   ALKPHOS 38 29* 42  BILITOT 0.7 0.6 0.33  PROT 6.8 5.9* 6.4  ALBUMIN 3.6 3.0* 3.1*   CBC:  Recent Labs  09/12/16 1307 09/13/16 0348 09/15/16 0340 09/27/16 1047  WBC 13.6* 9.6 5.5 22.6*  NEUTROABS 12.0* 6.9  --  17.7*  HGB 11.1* 10.2* 9.4* 10.9*  HCT 34.4* 30.8* 28.0* 32.4*  MCV 94.0 93.3 88.9 91.4  PLT  168 135* 129* 262   Cardiac Enzymes:  Recent Labs  09/12/16 1307  CKTOTAL 151   CBG:  Recent Labs  07/19/16 0650 09/12/16 2033 09/13/16 1719  GLUCAP 107* 113* 174*      Dg Chest 1 View  Result Date: 09/12/2016 CLINICAL DATA:  Productive  cough.  Fell last night. EXAM: CHEST 1 VIEW COMPARISON:  07/14/2016. FINDINGS: Borderline enlarged cardiac silhouette. Aortic calcification. Stable linear densities at the left lung base and small amount of left pleural thickening or fluid. Lower thoracic spine and bilateral shoulder degenerative changes. IMPRESSION: 1. Stable left basilar linear atelectasis or scarring and small amount of left pleural thickening or fluid. 2. Aortic atherosclerosis. Electronically Signed   By: Claudie Revering M.D.   On: 09/12/2016 14:16   Dg Pelvis 1-2 Views  Result Date: 09/12/2016 CLINICAL DATA:  Pelvic pain following a fall last night. EXAM: PELVIS - 1-2 VIEW COMPARISON:  Pelvis CT dated 09/25/2015 FINDINGS: No pelvic fracture or dislocation seen. Lower lumbar spine degenerative changes. IMPRESSION: No fracture. Electronically Signed   By: Claudie Revering M.D.   On: 09/12/2016 14:18   Ct Head Wo Contrast  Result Date: 09/12/2016 CLINICAL DATA:  Per EMS, pt is coming from home after experiencing a fall at home last night. Pt lives with her daughter and daughter stated pt did not hit her head when fall occurred. Daughter reports that pt had a fever and a productive cough last night and had increased weakness after the fall occurred. Pt usually ambulates independently but now requires assistance to get up out of the bed. Pt AO x4. EXAM: CT HEAD WITHOUT CONTRAST CT CERVICAL SPINE WITHOUT CONTRAST TECHNIQUE: Multidetector CT imaging of the head and cervical spine was performed following the standard protocol without intravenous contrast. Multiplanar CT image reconstructions of the cervical spine were also generated. COMPARISON:  07/14/2016 FINDINGS: CT HEAD FINDINGS Brain: No evidence of acute infarction, hemorrhage, hydrocephalus, extra-axial collection or mass lesion/mass effect. Age related volume loss. Mild chronic microvascular ischemic change. 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 Stable appearance from the prior exam.  Vascular: No hyperdense vessel or unexpected calcification. Skull: Normal. Negative for fracture or focal lesion. Sinuses/Orbits: Visualized globes and orbits are unremarkable. Visualized sinuses and mastoid air cells are clear. Other: None. CT CERVICAL SPINE FINDINGS Alignment: Mild reversal of the normal cervical lordosis, apex at C4. No spondylolisthesis. Skull base and vertebrae: No acute fracture. No primary bone lesion or focal pathologic process. Soft tissues and spinal canal: No prevertebral fluid or swelling. No visible canal hematoma. Disc levels: Mild loss of disc height from C3-C4 through C5-C6. Endplate spurring noted from C4 through C7. Facet degenerative change noted most prominent on the right at C2-C3. No disc herniations. Upper chest: No masses.  No acute findings. Other: None. IMPRESSION: HEAD CT:  No acute intracranial abnormalities.  No skull fracture. CERVICAL CT:  No fracture or acute finding. Electronically Signed   By: Lajean Manes M.D.   On: 09/12/2016 14:29   Ct Cervical Spine Wo Contrast  Result Date: 09/12/2016 CLINICAL DATA:  Per EMS, pt is coming from home after experiencing a fall at home last night. Pt lives with her daughter and daughter stated pt did not hit her head when fall occurred. Daughter reports that pt had a fever and a productive cough last night and had increased weakness after the fall occurred. Pt usually ambulates independently but now requires assistance to get up out of the bed. Pt AO x4. EXAM: CT  HEAD WITHOUT CONTRAST CT CERVICAL SPINE WITHOUT CONTRAST TECHNIQUE: Multidetector CT imaging of the head and cervical spine was performed following the standard protocol without intravenous contrast. Multiplanar CT image reconstructions of the cervical spine were also generated. COMPARISON:  07/14/2016 FINDINGS: CT HEAD FINDINGS Brain: No evidence of acute infarction, hemorrhage, hydrocephalus, extra-axial collection or mass lesion/mass effect. Age related volume loss.  Mild chronic microvascular ischemic change. 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 Stable appearance from the prior exam. Vascular: No hyperdense vessel or unexpected calcification. Skull: Normal. Negative for fracture or focal lesion. Sinuses/Orbits: Visualized globes and orbits are unremarkable. Visualized sinuses and mastoid air cells are clear. Other: None. CT CERVICAL SPINE FINDINGS Alignment: Mild reversal of the normal cervical lordosis, apex at C4. No spondylolisthesis. Skull base and vertebrae: No acute fracture. No primary bone lesion or focal pathologic process. Soft tissues and spinal canal: No prevertebral fluid or swelling. No visible canal hematoma. Disc levels: Mild loss of disc height from C3-C4 through C5-C6. Endplate spurring noted from C4 through C7. Facet degenerative change noted most prominent on the right at C2-C3. No disc herniations. Upper chest: No masses.  No acute findings. Other: None. IMPRESSION: HEAD CT:  No acute intracranial abnormalities.  No skull fracture. CERVICAL CT:  No fracture or acute finding. Electronically Signed   By: Lajean Manes M.D.   On: 09/12/2016 14:29   Dg Chest Port 1 View  Result Date: 09/15/2016 CLINICAL DATA:  [place]. EXAM: PORTABLE CHEST 1 VIEW COMPARISON:  09/13/2016 . FINDINGS: Mediastinum hilar structures normal. Cardiomegaly with normal pulmonary vascularity. No focal infiltrate. Stable pleural thickening on the left consistent with scarring. No pneumothorax . IMPRESSION: 1.  Stable cardiomegaly No pulmonary venous congestion. 2. Stable left base pleuroparenchymal thickening consistent with scarring. No focal infiltrate identified. Electronically Signed   By: Marcello Moores  Register   On: 09/15/2016 11:04   Dg Chest Port 1 View  Result Date: 09/13/2016 CLINICAL DATA:  Abnormal breathing sounds. History of COPD, CHF, diabetes. EXAM: PORTABLE CHEST 1 VIEW COMPARISON:  Chest radiograph September 12, 2016 FINDINGS: The cardiac silhouette is mildly enlarged  unchanged. Calcified aortic knob. Similar patchy LEFT lung base airspace opacity, blunted costophrenic angle. RIGHT lung is clear. No pneumothorax. Soft tissue planes and included osseous structures are nonsuspicious. IMPRESSION: Stable LEFT basilar airspace opacity with small LEFT pleural effusion versus pleural thickening. Electronically Signed   By: Elon Alas M.D.   On: 09/13/2016 01:58    ASSESSMENT/PLAN:  Generalized weakness - for home health PT and OT, for therapeutic strengthening exercises; fall precautions  Left lobar pneumonia - has completed Augmentin course, no fever, chest x-ray to follow-up pneumonia  Influenza - resolved  Escherichia coli UTI - completed course of Augmentin, resolved  Chronic kidney disease, stage IV - stable Lab Results  Component Value Date   CREATININE 1.4 (H) 09/27/2016   Anemia of chronic disease - stable Lab Results  Component Value Date   HGB 10.9 (L) 09/27/2016   PMR - continue tramadol 50 mg 1-2 tabs by mouth twice a day when necessary, Tylenol Extra Strength 500 mg 2 tabs = 1000 mg by mouth every 6 hours when necessary and prednisone 20 mg 1 tab by mouth daily; apply icy hot patch to skin on bilateral hip every morning and remove at at bedtime  CAD - no complaints of chest pains; continue NTG when necessary, isosorbide MN ER 60 mg 1 tab by mouth daily, Plavix 75 mg 1 tab by mouth daily, ASA 81 mg  1 tab PO Q D and pravastatin 20 mg 1 tab by mouth Q HS  Hypertension - well-controlled; continue Cardizem LA 180 mg 1 tab by mouth daily metoprolol succinate ER 100 mg 1 tab by mouth daily  GERD - continue pantoprazole 40 mg 1 tab by mouth daily  COPD - no SOB; continue Advair 250-50 diskus inhaler 1 puff into lungs daily at bedtime, pro-air HFA 90 g inhaler inhale 2 puffs into the lungs every 6 hours when necessary, prednisone 20 mg 1 tab by mouth daily, Spirival Respimat 2.5 g inh inhale 2 puffs into lungs every morning, Hydrocodone  -Homatropine 5-1.5 mg/ml give 5 ml TID PRN and Robitussin DM 100-10 mg/5 ml give 10 ml PO Q 6 hours PRN  Hyperlipidemia - continue pravastatin 20 mg 1 tab by mouth daily at bedtime  Hypothyroidism - continue levothyroxine 75 g 1 tab by mouth daily Lab Results  Component Value Date   TSH 0.554 07/14/2016   Osteoporosis - continue Prolia 60 g/ML injected into skin every 6 months  Constipation - continue MiraLAX 17 g by mouth daily and Colace 100 mg 2 capsules by mouth daily at bedtime  Schizoaffective disorder - continue Clozapine 200 mg tab + 100 mg tab = 300 mg  by mouth daily at bedtime  Leukocytosis - on chronic Prednisone, no fever, check WBC and repeat CXR Lab Results  Component Value Date   WBC 22.6 (H) 09/27/2016   Anemia of chronic disease -stable  Lab Results  Component Value Date   HGB 10.9 (L) 09/27/2016    MGUS  (monoclonal gammopathy of unknown significance) - follows-up with oncology, [name]     I have filled out patient's discharge paperwork and written prescriptions.  Patient will receive home health PT and OT.  DME provided:  None  Total discharge time: Greater than 30 minutes Greater than 50% was spent in counseling and coordination of care with the patient.   Discharge time involved coordination of the discharge process with social worker, nursing staff and therapy department. Medical justification for home health services verified.    Ireoluwa Grant C. [place] - NP    [employer] [phone]

## 2016-09-30 LAB — MULTIPLE MYELOMA PANEL, SERUM
ALBUMIN SERPL ELPH-MCNC: 3.3 g/dL (ref 2.9–4.4)
ALPHA 1: 0.3 g/dL (ref 0.0–0.4)
ALPHA2 GLOB SERPL ELPH-MCNC: 0.8 g/dL (ref 0.4–1.0)
Albumin/Glob SerPl: 1.3 (ref 0.7–1.7)
B-Globulin SerPl Elph-Mcnc: 1 g/dL (ref 0.7–1.3)
GLOBULIN, TOTAL: 2.6 g/dL (ref 2.2–3.9)
Gamma Glob SerPl Elph-Mcnc: 0.6 g/dL (ref 0.4–1.8)
IGA/IMMUNOGLOBULIN A, SERUM: 105 mg/dL (ref 64–422)
IGG (IMMUNOGLOBIN G), SERUM: 591 mg/dL — AB (ref 700–1600)
IGM (IMMUNOGLOBIN M), SRM: 42 mg/dL (ref 26–217)
M Protein SerPl Elph-Mcnc: 0.2 g/dL — ABNORMAL HIGH
Total Protein: 5.9 g/dL — ABNORMAL LOW (ref 6.0–8.5)

## 2016-10-07 ENCOUNTER — Inpatient Hospital Stay (HOSPITAL_COMMUNITY): Payer: Medicare Other

## 2016-10-07 ENCOUNTER — Emergency Department (HOSPITAL_COMMUNITY): Payer: Medicare Other

## 2016-10-07 ENCOUNTER — Encounter (HOSPITAL_COMMUNITY): Payer: Self-pay

## 2016-10-07 ENCOUNTER — Inpatient Hospital Stay (HOSPITAL_COMMUNITY)
Admission: EM | Admit: 2016-10-07 | Discharge: 2016-10-10 | DRG: 177 | Disposition: A | Discharge status: Home Health | Payer: Medicare Other | Attending: Internal Medicine | Admitting: Internal Medicine

## 2016-10-07 DIAGNOSIS — Z833 Family history of diabetes mellitus: Secondary | ICD-10-CM

## 2016-10-07 DIAGNOSIS — E785 Hyperlipidemia, unspecified: Secondary | ICD-10-CM | POA: Diagnosis present

## 2016-10-07 DIAGNOSIS — K567 Ileus, unspecified: Secondary | ICD-10-CM | POA: Diagnosis present

## 2016-10-07 DIAGNOSIS — Z881 Allergy status to other antibiotic agents status: Secondary | ICD-10-CM

## 2016-10-07 DIAGNOSIS — D72829 Elevated white blood cell count, unspecified: Secondary | ICD-10-CM | POA: Diagnosis present

## 2016-10-07 DIAGNOSIS — G4733 Obstructive sleep apnea (adult) (pediatric): Secondary | ICD-10-CM | POA: Diagnosis present

## 2016-10-07 DIAGNOSIS — Z8701 Personal history of pneumonia (recurrent): Secondary | ICD-10-CM

## 2016-10-07 DIAGNOSIS — Z9049 Acquired absence of other specified parts of digestive tract: Secondary | ICD-10-CM | POA: Diagnosis not present

## 2016-10-07 DIAGNOSIS — J690 Pneumonitis due to inhalation of food and vomit: Secondary | ICD-10-CM | POA: Diagnosis not present

## 2016-10-07 DIAGNOSIS — Z8673 Personal history of transient ischemic attack (TIA), and cerebral infarction without residual deficits: Secondary | ICD-10-CM | POA: Diagnosis not present

## 2016-10-07 DIAGNOSIS — Z7902 Long term (current) use of antithrombotics/antiplatelets: Secondary | ICD-10-CM

## 2016-10-07 DIAGNOSIS — R112 Nausea with vomiting, unspecified: Secondary | ICD-10-CM | POA: Diagnosis not present

## 2016-10-07 DIAGNOSIS — Z955 Presence of coronary angioplasty implant and graft: Secondary | ICD-10-CM

## 2016-10-07 DIAGNOSIS — D472 Monoclonal gammopathy: Secondary | ICD-10-CM | POA: Diagnosis present

## 2016-10-07 DIAGNOSIS — B9620 Unspecified Escherichia coli [E. coli] as the cause of diseases classified elsewhere: Secondary | ICD-10-CM | POA: Diagnosis not present

## 2016-10-07 DIAGNOSIS — I130 Hypertensive heart and chronic kidney disease with heart failure and stage 1 through stage 4 chronic kidney disease, or unspecified chronic kidney disease: Secondary | ICD-10-CM | POA: Diagnosis present

## 2016-10-07 DIAGNOSIS — J181 Lobar pneumonia, unspecified organism: Secondary | ICD-10-CM | POA: Insufficient documentation

## 2016-10-07 DIAGNOSIS — G9340 Encephalopathy, unspecified: Secondary | ICD-10-CM | POA: Diagnosis not present

## 2016-10-07 DIAGNOSIS — M353 Polymyalgia rheumatica: Secondary | ICD-10-CM | POA: Diagnosis present

## 2016-10-07 DIAGNOSIS — I5032 Chronic diastolic (congestive) heart failure: Secondary | ICD-10-CM | POA: Diagnosis present

## 2016-10-07 DIAGNOSIS — Z90710 Acquired absence of both cervix and uterus: Secondary | ICD-10-CM | POA: Diagnosis not present

## 2016-10-07 DIAGNOSIS — K219 Gastro-esophageal reflux disease without esophagitis: Secondary | ICD-10-CM | POA: Diagnosis present

## 2016-10-07 DIAGNOSIS — R918 Other nonspecific abnormal finding of lung field: Secondary | ICD-10-CM | POA: Diagnosis not present

## 2016-10-07 DIAGNOSIS — E1122 Type 2 diabetes mellitus with diabetic chronic kidney disease: Secondary | ICD-10-CM | POA: Diagnosis present

## 2016-10-07 DIAGNOSIS — E669 Obesity, unspecified: Secondary | ICD-10-CM | POA: Diagnosis present

## 2016-10-07 DIAGNOSIS — K8689 Other specified diseases of pancreas: Secondary | ICD-10-CM | POA: Diagnosis not present

## 2016-10-07 DIAGNOSIS — Z7982 Long term (current) use of aspirin: Secondary | ICD-10-CM

## 2016-10-07 DIAGNOSIS — R4182 Altered mental status, unspecified: Secondary | ICD-10-CM | POA: Diagnosis not present

## 2016-10-07 DIAGNOSIS — I10 Essential (primary) hypertension: Secondary | ICD-10-CM | POA: Diagnosis not present

## 2016-10-07 DIAGNOSIS — F259 Schizoaffective disorder, unspecified: Secondary | ICD-10-CM | POA: Diagnosis present

## 2016-10-07 DIAGNOSIS — J440 Chronic obstructive pulmonary disease with acute lower respiratory infection: Secondary | ICD-10-CM | POA: Diagnosis present

## 2016-10-07 DIAGNOSIS — E039 Hypothyroidism, unspecified: Secondary | ICD-10-CM | POA: Diagnosis present

## 2016-10-07 DIAGNOSIS — R0902 Hypoxemia: Secondary | ICD-10-CM | POA: Diagnosis not present

## 2016-10-07 DIAGNOSIS — Z87891 Personal history of nicotine dependence: Secondary | ICD-10-CM

## 2016-10-07 DIAGNOSIS — Z91048 Other nonmedicinal substance allergy status: Secondary | ICD-10-CM

## 2016-10-07 DIAGNOSIS — Z79899 Other long term (current) drug therapy: Secondary | ICD-10-CM

## 2016-10-07 DIAGNOSIS — R05 Cough: Secondary | ICD-10-CM | POA: Diagnosis not present

## 2016-10-07 DIAGNOSIS — D631 Anemia in chronic kidney disease: Secondary | ICD-10-CM | POA: Diagnosis not present

## 2016-10-07 DIAGNOSIS — N189 Chronic kidney disease, unspecified: Secondary | ICD-10-CM

## 2016-10-07 DIAGNOSIS — J189 Pneumonia, unspecified organism: Secondary | ICD-10-CM

## 2016-10-07 DIAGNOSIS — N183 Chronic kidney disease, stage 3 (moderate): Secondary | ICD-10-CM | POA: Diagnosis not present

## 2016-10-07 DIAGNOSIS — G9341 Metabolic encephalopathy: Secondary | ICD-10-CM | POA: Diagnosis present

## 2016-10-07 DIAGNOSIS — K5909 Other constipation: Secondary | ICD-10-CM

## 2016-10-07 DIAGNOSIS — Z8249 Family history of ischemic heart disease and other diseases of the circulatory system: Secondary | ICD-10-CM

## 2016-10-07 DIAGNOSIS — Z7952 Long term (current) use of systemic steroids: Secondary | ICD-10-CM

## 2016-10-07 DIAGNOSIS — N390 Urinary tract infection, site not specified: Secondary | ICD-10-CM | POA: Diagnosis present

## 2016-10-07 DIAGNOSIS — N184 Chronic kidney disease, stage 4 (severe): Secondary | ICD-10-CM | POA: Diagnosis not present

## 2016-10-07 DIAGNOSIS — I2510 Atherosclerotic heart disease of native coronary artery without angina pectoris: Secondary | ICD-10-CM | POA: Diagnosis present

## 2016-10-07 DIAGNOSIS — R1111 Vomiting without nausea: Secondary | ICD-10-CM | POA: Diagnosis not present

## 2016-10-07 LAB — URINALYSIS, ROUTINE W REFLEX MICROSCOPIC
Bilirubin Urine: NEGATIVE
Glucose, UA: NEGATIVE mg/dL
Hgb urine dipstick: NEGATIVE
KETONES UR: 5 mg/dL — AB
Nitrite: NEGATIVE
PH: 5 (ref 5.0–8.0)
Protein, ur: 30 mg/dL — AB
SQUAMOUS EPITHELIAL / LPF: NONE SEEN
Specific Gravity, Urine: 1.02 (ref 1.005–1.030)

## 2016-10-07 LAB — CBC
HCT: 35.1 % — ABNORMAL LOW (ref 36.0–46.0)
HEMOGLOBIN: 11.6 g/dL — AB (ref 12.0–15.0)
MCH: 30.3 pg (ref 26.0–34.0)
MCHC: 33 g/dL (ref 30.0–36.0)
MCV: 91.6 fL (ref 78.0–100.0)
PLATELETS: 255 10*3/uL (ref 150–400)
RBC: 3.83 MIL/uL — ABNORMAL LOW (ref 3.87–5.11)
RDW: 14 % (ref 11.5–15.5)
WBC: 25 10*3/uL — ABNORMAL HIGH (ref 4.0–10.5)

## 2016-10-07 LAB — COMPREHENSIVE METABOLIC PANEL
ALT: 16 U/L (ref 14–54)
ANION GAP: 13 (ref 5–15)
AST: 22 U/L (ref 15–41)
Albumin: 3.6 g/dL (ref 3.5–5.0)
Alkaline Phosphatase: 47 U/L (ref 38–126)
BILIRUBIN TOTAL: 0.4 mg/dL (ref 0.3–1.2)
BUN: 46 mg/dL — ABNORMAL HIGH (ref 6–20)
CALCIUM: 9.7 mg/dL (ref 8.9–10.3)
CHLORIDE: 100 mmol/L — AB (ref 101–111)
CO2: 28 mmol/L (ref 22–32)
CREATININE: 2.14 mg/dL — AB (ref 0.44–1.00)
GFR, EST AFRICAN AMERICAN: 24 mL/min — AB (ref >60)
GFR, EST NON AFRICAN AMERICAN: 21 mL/min — AB (ref >60)
GLUCOSE: 122 mg/dL — AB (ref 65–99)
POTASSIUM: 3.8 mmol/L (ref 3.5–5.1)
Sodium: 141 mmol/L (ref 135–145)
Total Protein: 7.1 g/dL (ref 6.5–8.1)

## 2016-10-07 LAB — LIPASE, BLOOD: LIPASE: 21 U/L (ref 11–51)

## 2016-10-07 LAB — DIFFERENTIAL
BASOS ABS: 0 10*3/uL (ref 0.0–0.1)
Basophils Relative: 0 %
Eosinophils Absolute: 0.3 10*3/uL (ref 0.0–0.7)
Eosinophils Relative: 1 %
LYMPHS PCT: 12 %
Lymphs Abs: 3 10*3/uL (ref 0.7–4.0)
MONOS PCT: 7 %
Monocytes Absolute: 1.8 10*3/uL — ABNORMAL HIGH (ref 0.1–1.0)
NEUTROS PCT: 80 %
Neutro Abs: 19.9 10*3/uL — ABNORMAL HIGH (ref 1.7–7.7)

## 2016-10-07 LAB — BLOOD GAS, ARTERIAL
Acid-base deficit: 3 mmol/L — ABNORMAL HIGH (ref 0.0–2.0)
Bicarbonate: 22.5 mmol/L (ref 20.0–28.0)
Drawn by: 441261
FIO2: 21
O2 SAT: 84.3 %
PO2 ART: 56.7 mmHg — AB (ref 83.0–108.0)
Patient temperature: 98.6
pCO2 arterial: 45.6 mmHg (ref 32.0–48.0)
pH, Arterial: 7.315 — ABNORMAL LOW (ref 7.350–7.450)

## 2016-10-07 LAB — INFLUENZA PANEL BY PCR (TYPE A & B)
INFLAPCR: NEGATIVE
INFLBPCR: NEGATIVE

## 2016-10-07 LAB — CBG MONITORING, ED: GLUCOSE-CAPILLARY: 171 mg/dL — AB (ref 65–99)

## 2016-10-07 LAB — GLUCOSE, CAPILLARY
GLUCOSE-CAPILLARY: 89 mg/dL (ref 65–99)
Glucose-Capillary: 106 mg/dL — ABNORMAL HIGH (ref 65–99)

## 2016-10-07 LAB — STREP PNEUMONIAE URINARY ANTIGEN: STREP PNEUMO URINARY ANTIGEN: NEGATIVE

## 2016-10-07 MED ORDER — METOPROLOL SUCCINATE ER 100 MG PO TB24
100.0000 mg | ORAL_TABLET | Freq: Every day | ORAL | Status: DC
Start: 2016-10-07 — End: 2016-10-10
  Administered 2016-10-07 – 2016-10-10 (×4): 100 mg via ORAL
  Filled 2016-10-07 (×4): qty 1

## 2016-10-07 MED ORDER — POLYETHYLENE GLYCOL 3350 17 G PO PACK
17.0000 g | PACK | Freq: Every day | ORAL | Status: DC
  Administered 2016-10-08 – 2016-10-10 (×3): 17 g via ORAL
  Filled 2016-10-07 (×3): qty 1

## 2016-10-07 MED ORDER — DILTIAZEM HCL ER COATED BEADS 180 MG PO TB24
180.0000 mg | ORAL_TABLET | Freq: Every day | ORAL | Status: DC
  Administered 2016-10-08 – 2016-10-10 (×3): 180 mg via ORAL
  Filled 2016-10-07 (×6): qty 1

## 2016-10-07 MED ORDER — IPRATROPIUM-ALBUTEROL 0.5-2.5 (3) MG/3ML IN SOLN
3.0000 mL | Freq: Four times a day (QID) | RESPIRATORY_TRACT | Status: DC
  Administered 2016-10-07: 3 mL via RESPIRATORY_TRACT
  Filled 2016-10-07 (×2): qty 3

## 2016-10-07 MED ORDER — CLOZAPINE 100 MG PO TABS
300.0000 mg | ORAL_TABLET | Freq: Every day | ORAL | Status: DC
Start: 2016-10-07 — End: 2016-10-10
  Administered 2016-10-07 – 2016-10-09 (×3): 300 mg via ORAL
  Filled 2016-10-07 (×3): qty 3

## 2016-10-07 MED ORDER — SODIUM CHLORIDE 0.9 % IV BOLUS (SEPSIS)
500.0000 mL | Freq: Once | INTRAVENOUS | Status: AC
Start: 2016-10-07 — End: 2016-10-07
  Administered 2016-10-07: 500 mL via INTRAVENOUS

## 2016-10-07 MED ORDER — TRAMADOL HCL 50 MG PO TABS
100.0000 mg | ORAL_TABLET | Freq: Two times a day (BID) | ORAL | Status: DC | PRN
  Administered 2016-10-09 – 2016-10-10 (×2): 100 mg via ORAL
  Filled 2016-10-07 (×2): qty 2

## 2016-10-07 MED ORDER — ADULT MULTIVITAMIN W/MINERALS CH
1.0000 | ORAL_TABLET | Freq: Every day | ORAL | Status: DC
  Filled 2016-10-07: qty 1

## 2016-10-07 MED ORDER — ISOSORBIDE MONONITRATE ER 60 MG PO TB24
60.0000 mg | ORAL_TABLET | Freq: Every day | ORAL | Status: DC
  Administered 2016-10-08 – 2016-10-10 (×3): 60 mg via ORAL
  Filled 2016-10-07 (×3): qty 1

## 2016-10-07 MED ORDER — SODIUM CHLORIDE 0.9 % IV BOLUS (SEPSIS)
500.0000 mL | Freq: Once | INTRAVENOUS | Status: AC
  Administered 2016-10-07: 500 mL via INTRAVENOUS

## 2016-10-07 MED ORDER — VITAMIN D (ERGOCALCIFEROL) 1.25 MG (50000 UNIT) PO CAPS: 50000.0000 [IU] | ORAL_CAPSULE | ORAL | Status: DC

## 2016-10-07 MED ORDER — CRANBERRY 250 MG PO TABS: 1.0000 | ORAL_TABLET | Freq: Every day | ORAL | Status: DC

## 2016-10-07 MED ORDER — IPRATROPIUM-ALBUTEROL 0.5-2.5 (3) MG/3ML IN SOLN
3.0000 mL | Freq: Three times a day (TID) | RESPIRATORY_TRACT | Status: DC
  Administered 2016-10-08 – 2016-10-10 (×7): 3 mL via RESPIRATORY_TRACT
  Filled 2016-10-07 (×7): qty 3

## 2016-10-07 MED ORDER — SODIUM CHLORIDE 0.9 % IV SOLN: INTRAVENOUS | Status: AC

## 2016-10-07 MED ORDER — ACETAMINOPHEN 500 MG PO TABS
1000.0000 mg | ORAL_TABLET | Freq: Four times a day (QID) | ORAL | Status: DC | PRN
  Administered 2016-10-09: 1000 mg via ORAL
  Filled 2016-10-07: qty 2

## 2016-10-07 MED ORDER — AZITHROMYCIN 500 MG IV SOLR
500.0000 mg | INTRAVENOUS | Status: DC
  Administered 2016-10-08 – 2016-10-09 (×2): 500 mg via INTRAVENOUS
  Filled 2016-10-07 (×3): qty 500

## 2016-10-07 MED ORDER — FAMOTIDINE IN NACL 20-0.9 MG/50ML-% IV SOLN
20.0000 mg | Freq: Once | INTRAVENOUS | Status: AC
  Administered 2016-10-07: 20 mg via INTRAVENOUS
  Filled 2016-10-07: qty 50

## 2016-10-07 MED ORDER — ASPIRIN 81 MG PO TBEC: 81.0000 mg | DELAYED_RELEASE_TABLET | Freq: Every day | ORAL | Status: DC

## 2016-10-07 MED ORDER — PREDNISONE 20 MG PO TABS
20.0000 mg | ORAL_TABLET | Freq: Every day | ORAL | Status: DC
  Administered 2016-10-08 – 2016-10-09 (×2): 20 mg via ORAL
  Filled 2016-10-07 (×2): qty 1

## 2016-10-07 MED ORDER — DOCUSATE SODIUM 100 MG PO CAPS
200.0000 mg | ORAL_CAPSULE | Freq: Every day | ORAL | Status: DC
  Administered 2016-10-07 – 2016-10-09 (×2): 200 mg via ORAL
  Filled 2016-10-07 (×2): qty 2

## 2016-10-07 MED ORDER — IOPAMIDOL (ISOVUE-300) INJECTION 61%
30.0000 mL | Freq: Once | INTRAVENOUS | Status: AC | PRN
  Administered 2016-10-07: 30 mL via ORAL

## 2016-10-07 MED ORDER — LORATADINE 10 MG PO TABS
10.0000 mg | ORAL_TABLET | Freq: Every day | ORAL | Status: DC
  Administered 2016-10-08 – 2016-10-10 (×3): 10 mg via ORAL
  Filled 2016-10-07 (×3): qty 1

## 2016-10-07 MED ORDER — DEXTROSE 5 % IV SOLN
1.0000 g | Freq: Once | INTRAVENOUS | Status: AC
  Administered 2016-10-07: 1 g via INTRAVENOUS
  Filled 2016-10-07: qty 10

## 2016-10-07 MED ORDER — CLOPIDOGREL BISULFATE 75 MG PO TABS
75.0000 mg | ORAL_TABLET | Freq: Every day | ORAL | Status: DC
  Administered 2016-10-08 – 2016-10-10 (×3): 75 mg via ORAL
  Filled 2016-10-07 (×3): qty 1

## 2016-10-07 MED ORDER — IOPAMIDOL (ISOVUE-300) INJECTION 61%
INTRAVENOUS | Status: AC
  Administered 2016-10-07: 30 mL via ORAL
  Filled 2016-10-07: qty 30

## 2016-10-07 MED ORDER — IPRATROPIUM-ALBUTEROL 0.5-2.5 (3) MG/3ML IN SOLN
3.0000 mL | RESPIRATORY_TRACT | Status: DC | PRN
  Administered 2016-10-07 – 2016-10-10 (×2): 3 mL via RESPIRATORY_TRACT
  Filled 2016-10-07: qty 3

## 2016-10-07 MED ORDER — ASPIRIN EC 81 MG PO TBEC
81.0000 mg | DELAYED_RELEASE_TABLET | Freq: Every day | ORAL | Status: DC
  Administered 2016-10-08 – 2016-10-10 (×3): 81 mg via ORAL
  Filled 2016-10-07 (×3): qty 1

## 2016-10-07 MED ORDER — DEXTROSE 5 % IV SOLN
500.0000 mg | Freq: Once | INTRAVENOUS | Status: AC
  Administered 2016-10-07: 500 mg via INTRAVENOUS
  Filled 2016-10-07: qty 500

## 2016-10-07 MED ORDER — INSULIN ASPART 100 UNIT/ML ~~LOC~~ SOLN
0.0000 [IU] | Freq: Three times a day (TID) | SUBCUTANEOUS | Status: DC
  Administered 2016-10-08: 5 [IU] via SUBCUTANEOUS
  Administered 2016-10-08 – 2016-10-09 (×2): 3 [IU] via SUBCUTANEOUS
  Administered 2016-10-09 – 2016-10-10 (×2): 2 [IU] via SUBCUTANEOUS

## 2016-10-07 MED ORDER — DEXTROSE 5 % IV SOLN
1.0000 g | INTRAVENOUS | Status: DC
  Administered 2016-10-08: 1 g via INTRAVENOUS
  Filled 2016-10-07 (×2): qty 10

## 2016-10-07 MED ORDER — PRAVASTATIN SODIUM 20 MG PO TABS
20.0000 mg | ORAL_TABLET | Freq: Every day | ORAL | Status: DC
  Administered 2016-10-07 – 2016-10-09 (×3): 20 mg via ORAL
  Filled 2016-10-07 (×3): qty 1

## 2016-10-07 MED ORDER — GUAIFENESIN-DM 100-10 MG/5ML PO SYRP: 10.0000 mL | ORAL_SOLUTION | Freq: Four times a day (QID) | ORAL | Status: DC | PRN

## 2016-10-07 MED ORDER — ONDANSETRON HCL 4 MG/2ML IJ SOLN
4.0000 mg | Freq: Once | INTRAMUSCULAR | Status: AC | PRN
  Administered 2016-10-07: 4 mg via INTRAVENOUS
  Filled 2016-10-07: qty 2

## 2016-10-07 MED ORDER — POLYETHYLENE GLYCOL 3350 17 GM/SCOOP PO POWD
17.0000 g | Freq: Every day | ORAL | Status: DC
  Filled 2016-10-07: qty 255

## 2016-10-07 MED ORDER — HYDROCODONE-HOMATROPINE 5-1.5 MG/5ML PO SYRP
5.0000 mL | ORAL_SOLUTION | Freq: Three times a day (TID) | ORAL | Status: DC
  Administered 2016-10-07 – 2016-10-10 (×9): 5 mL via ORAL
  Filled 2016-10-07 (×9): qty 5

## 2016-10-07 MED ORDER — LEVOTHYROXINE SODIUM 75 MCG PO TABS
75.0000 ug | ORAL_TABLET | Freq: Every day | ORAL | Status: DC
  Administered 2016-10-08 – 2016-10-10 (×3): 75 ug via ORAL
  Filled 2016-10-07 (×3): qty 1

## 2016-10-07 MED ORDER — PANTOPRAZOLE SODIUM 40 MG PO TBEC
40.0000 mg | DELAYED_RELEASE_TABLET | Freq: Every day | ORAL | Status: DC
  Administered 2016-10-08 – 2016-10-09 (×2): 40 mg via ORAL
  Filled 2016-10-07 (×2): qty 1

## 2016-10-07 NOTE — ED Notes (Addendum)
Spoke to admitting MD about new onset of decreased loc. Pt is arousal to sternal rub yet unable to stay alert. Pt has become incontinent of stool.  Crackles lung sound upon assessment.  Will, PS made aware . Chest cray in route.  Head CT will be ordered

## 2016-10-07 NOTE — Progress Notes (Signed)
PHARMACIST - PHYSICIAN ORDER COMMUNICATION  CONCERNING: P&T Medication Policy on Herbal Medications  DESCRIPTION:  This patient's order for:  Cranberry  has been noted.  This product(s) is classified as an "herbal" or natural product. Due to a lack of definitive safety studies or FDA approval, nonstandard manufacturing practices, plus the potential risk of unknown drug-drug interactions while on inpatient medications, the Pharmacy and Therapeutics Committee does not permit the use of "herbal" or natural products of this type within [facility].   ACTION TAKEN: The pharmacy department is unable to verify this order at this time and your patient has been informed of this safety policy. Please reevaluate patient's clinical condition at discharge and address if the herbal or natural product(s) should be resumed at that time.  Thank you,   Minda Ditto PharmD Pager [phone] 10/07/2016, 3:28 PM

## 2016-10-07 NOTE — Clinical Social Work Note (Signed)
Clinical Social Work Assessment  Patient Details  Name: Rebecca [name] MRN: [id number] Date of Birth: [birth year]  Date of referral:  10/07/16               Reason for consult:  Facility Placement, Discharge Planning, Care Management Concerns                Permission sought to share information with:    Permission granted to share information::  Yes, Verbal Permission Granted  Name::        Agency::     Relationship::     Contact Information:     Housing/Transportation Living arrangements for the past 2 months:  Single Family Home Source of Information:  Adult Children Patient Interpreter Needed:  None Criminal Activity/Legal Involvement Pertinent to Current Situation/Hospitalization:    Significant Relationships:  Adult Children Lives with:  Self Do you feel safe going back to the place where you live?  Yes Need for family participation in patient care:  Yes (Comment)  Care giving concerns:  Pt's daughter Rebecca [name] at ph: [phone] states she must be present or consulted for all D/C decision-making   Social Worker assessment / plan:  CSW attempted to meet with pt who was not alert and oriented and called pt's daughter and confirmed pt's plan to be discharged home only to live at discharge with [facility].  CSW provided active listening and validated pt's concerns that pt not return to any SNF.   Pt's daughter DID NOT give CSW Dept permission to complete FL-2 and send referrals out to SNF facilities via the hub per pt's request.  Pt has been living independently prior to being admitted to [facility].   Employment status:  Retired Nurse, adult, [place] PT Recommendations:  Not assessed at this time Information / Referral to community resources:     Patient/Family's Response to care:  Patient not alert and oriented.  Patient's daughter agreeable to plan.  Pt's daughter supportive and strongly involved in pt.'s care.  Pt.'s daughter pleasant and  appreciated CSW intervention.    Patient/Family's Understanding of and Emotional Response to Diagnosis, Current Treatment, and Prognosis:  Still assessing  Emotional Assessment Appearance:  Appears stated age Attitude/Demeanor/Rapport:    Affect (typically observed):  Unable to Assess Orientation:   (Pt was asleep and per RN not oriented) Alcohol / Substance use:    Psych involvement (Current and /or in the community):     Discharge Needs  Concerns to be addressed:  Care Coordination, Discharge Planning Concerns Readmission within the last 30 days:  Yes Current discharge risk:  None Barriers to Discharge:  No Barriers Identified   Claudine Mouton, LCSWA 10/07/2016, 6:17 PM

## 2016-10-07 NOTE — Progress Notes (Signed)
CSW met with pt's RN and attempted to meet with pt, pt not alert and oriented and is being transported to inpatient room 1408.    Per RN and per chart pt's daughter Astryd Pearcy at ph: [phone] is primary contact.  CSW will continue to follow for D/C needs.  Alphonse Guild. Gean Laursen, Latanya Presser, LCAS Clinical Social Worker Ph: [phone]

## 2016-10-07 NOTE — H&P (Addendum)
History and Physical    Rebecca [name] MVH:[id number] DOB: 1941-02-21 DOA: 10/07/2016  Referring MD/NP/PA:PA Dansie  PCP: [name], MD   Outpatient Specialists: Oncology, [name]  Patient coming from: SNF  Chief Complaint: Nausea, vomiting, abdominal pain  HPI: Rebecca [name] is a 76 y.o. female with medical history significant for MGUS, memory deficit, hypertension, hypothyroidism, dyslipidemia, polymyalgia rheumatica on low-dose prednisone. Patient presented from? Skilled nursing facility. There is no family at the bedside and patient is not really a good historian because she is slightly lethargic. Her daughter was earlier in the room and reported ongoing nausea, vomiting and abdominal pain for 24 hours prior to this admission and there was a concern that patient may have small bowel obstruction. Abdominal pain was apparently in the lower abdomen but no further history obtained due to patient's altered mental status. There was no diarrhea. No fevers or chills. No reports of chest pain or cough or shortness of breath. No urinary complaints.  ED Course: In ED, patient was hemodynamically stable, oxygen saturation with nasal cannula oxygen support 97%. White blood cell count was 25, hemoglobin 11.6 and creatinine 2.14 (baseline creatinine 2.1 about 3 weeks ago). Chest x-ray showed patchy infiltrates in left lung and she was started on azithromycin and Rocephin for possible pneumonia. CT abdomen showed possible small bowel ileus however patient did have a bowel movement in ED.  Review of Systems:  Constitutional: Negative for fever, chills, diaphoresis, activity change, appetite change and fatigue.  HENT: Negative for ear pain, nosebleeds, congestion, facial swelling, rhinorrhea, neck pain, neck stiffness and ear discharge.   Eyes: Negative for pain, discharge, redness, itching and visual disturbance.  Respiratory: Negative for cough, choking, chest tightness, shortness of breath,  wheezing and stridor.   Cardiovascular: Negative for chest pain, palpitations and leg swelling.  Gastrointestinal: Negative for abdominal distention. Positive for nausea, vomiting and abdominal pain. Genitourinary: Negative for dysuria, urgency, frequency, hematuria, flank pain, decreased urine volume, difficulty urinating and dyspareunia.  Musculoskeletal: Negative for back pain, joint swelling, arthralgias and gait problem.  Neurological: Negative for dizziness, tremors, seizures, syncope, facial asymmetry, speech difficulty, weakness, light-headedness, numbness and headaches.  Hematological: Negative for adenopathy. Does not bruise/bleed easily.  Psychiatric/Behavioral: Negative for hallucinations, behavioral problems, confusion, dysphoric mood, decreased concentration and agitation.   Past Medical History:  Diagnosis Date  . AKI (acute kidney injury) ([place])   . Anemia in chronic renal disease   . CAD (coronary artery disease) 2006   Taxus stent of mid RCA. negative myoview 2014 and normal LV function  . CKD (chronic kidney disease) stage 4, GFR 15-29 ml/min ([facility])   . Complication of anesthesia   . COPD (chronic obstructive pulmonary disease) ([place])   . Diabetes mellitus   . Diastolic heart failure, NYHA class 1 ([place])   . Dyslipidemia   . Dysuria 01/06/2015  . Frozen shoulder    right  . Gastroesophageal reflux   . HAP (hospital-acquired pneumonia)   . Headache   . Hyperkalemia 01/01/2016  . Hypothyroidism   . 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 Obesity   . Pneumonia 06/2016  . Polymyalgia rheumatica ([place])   . PONV (postoperative nausea and vomiting)   . Renal insufficiency   . Respiratory distress   . Schizoaffective disorder ([place])   . Sepsis due to pneumonia ([place])   . Shortness of breath   . Stroke [facility])     Past Surgical History:  Procedure Laterality Date  . ABDOMINAL HYSTERECTOMY    .  CARDIAC CATHETERIZATION N/A 08/13/2015   Procedure:  Left Heart Cath and Coronary Angiography;  Surgeon: Jettie Booze, MD;  Location: [facility];  Service: Cardiovascular;  Laterality: N/A;  . CATARACT EXTRACTION    . CHOLECYSTECTOMY    . CORONARY ANGIOPLASTY WITH STENT PLACEMENT  2006   Taxus DES to RCA, 50 % residual  . PARTIAL HYSTERECTOMY  1995    Social history:  reports that she quit smoking about 12 years ago. She has a 35.00 pack-year smoking history. She has never used smokeless tobacco. She reports that she does not drink alcohol or use drugs.  Ambulation: Not sure what patient's baseline ambulation status is because she is slightly lethargic and unable to provide that history  Allergies  Allergen Reactions  . Levaquin [Levofloxacin] Other (See Comments)    Ruptured tendon  . Tape Other (See Comments)    PATIENT'S SKIN IS VERY, VERY THIN AND WILL TEAR AND BRUISE EASILY; PLEASE USE AN ALTERNATIVE (SOMETHING OTHER THAN TAPE)    Family History  Problem Relation Age of Onset  . Diabetes Mother   . Heart disease Mother   . Heart disease Father   . Arthritis Father   . Healthy Sister   . Heart disease Brother   . Cancer Brother     colon ca  . Osteoporosis Sister   . Heart disease Brother   . Diabetes Brother   . Heart disease Brother   . Diabetes Brother   . Diabetes Brother   . Healthy Brother   . Healthy Brother   . Healthy Brother   . Cancer Maternal Uncle     blood condition  . Cancer Daughter     breast ca  . Cancer Cousin     NHL    Prior to Admission medications   Medication Sig Start Date End Date Taking? Authorizing Provider  acetaminophen (TYLENOL) 500 MG tablet Take 1,000 mg by mouth every 6 (six) hours as needed (pain).    Yes Historical Provider, MD  albuterol (PROAIR HFA) 108 (90 Base) MCG/ACT inhaler Inhale 2 puffs into the lungs every 6 (six) hours as needed for wheezing or shortness of breath.   Yes Historical Provider, MD  aspirin EC 81 MG EC tablet Take 1 tablet (81 mg total) by  mouth daily. 07/20/16  Yes Robbie Lis, MD  cetirizine (ZYRTEC) 10 MG tablet Take 10 mg by mouth at bedtime.    Yes Historical Provider, MD  clopidogrel (PLAVIX) 75 MG tablet Take 1 tablet (75 mg total) by mouth daily with breakfast. 10/18/13  Yes Thurnell Lose, MD  clozapine (CLOZARIL) 200 MG tablet Take 300 mg by mouth at bedtime. ]   Yes Historical Provider, MD  Cranberry 250 MG TABS Take 1 tablet by mouth at bedtime.   Yes Historical Provider, MD  denosumab (PROLIA) 60 MG/ML SOLN injection Inject 60 mg into the skin every 6 (six) months. Administer in upper arm, thigh, or abdomen   Yes Historical Provider, MD  dextromethorphan-guaiFENesin (ROBITUSSIN-DM) 10-100 MG/5ML liquid Take 10 mLs by mouth every 6 (six) hours as needed for cough.   Yes Historical Provider, MD  diltiazem (CARDIZEM LA) 180 MG 24 hr tablet Take 180 mg by mouth daily with breakfast. 07/16/15  Yes Historical Provider, MD  docusate sodium (COLACE) 100 MG capsule Take 200 mg by mouth at bedtime.  03/01/12  Yes Katherina Mires, MD  Fluticasone-Salmeterol (ADVAIR) 250-50 MCG/DOSE AEPB Inhale 1 puff into the lungs at bedtime.  Yes Historical Provider, MD  HYDROcodone-homatropine (HYCODAN) 5-1.5 MG/5ML syrup Take 5 mLs by mouth 3 (three) times daily. 09/27/16  Yes Heath Lark, MD  Hydrocortisone (GERHARDT'S BUTT CREAM) CREA Apply 1 application topically 3 (three) times daily. Apply to sacral region every shift for stage 2 pressure injury.   Yes Historical Provider, MD  isosorbide mononitrate (IMDUR) 60 MG 24 hr tablet Take 1 tablet (60 mg total) by mouth daily. 03/14/13  Yes Isaiah Serge, NP  levothyroxine (SYNTHROID, LEVOTHROID) 75 MCG tablet Take 75 mcg by mouth daily before breakfast.   Yes Historical Provider, MD  metoprolol succinate (TOPROL-XL) 100 MG 24 hr tablet Take 100 mg by mouth daily with breakfast. Take with or immediately following a meal.   Yes Historical Provider, MD  Multiple Vitamins-Minerals (MULTIVITAMIN WITH MINERALS)  tablet Take 1 tablet by mouth daily.   Yes Historical Provider, MD  nitroGLYCERIN (NITROSTAT) 0.4 MG SL tablet Place 1 tablet (0.4 mg total) under the tongue every 5 (five) minutes as needed. For chest pain 01/07/15  Yes Pixie Casino, MD  pantoprazole (PROTONIX) 40 MG tablet Take 1 tablet (40 mg total) by mouth daily. 04/03/13  Yes Pixie Casino, MD  polyethylene glycol powder (GLYCOLAX/MIRALAX) powder Take 17 g by mouth daily. [place]   Yes Historical Provider, MD  pravastatin (PRAVACHOL) 20 MG tablet Take 20 mg by mouth at bedtime.   Yes Historical Provider, MD  predniSONE (DELTASONE) 20 MG tablet Take 1 tablet (20 mg total) by mouth daily with breakfast. 09/27/16  Yes Heath Lark, MD  Tiotropium Bromide Monohydrate (SPIRIVA RESPIMAT) 2.5 MCG/ACT AERS Inhale 2 puffs into the lungs every morning.    Yes Historical Provider, MD  traMADol (ULTRAM) 50 MG tablet Take 100 mg by mouth 2 (two) times daily as needed for moderate pain.    Yes Historical Provider, MD  Vitamin D, Ergocalciferol, (DRISDOL) 50000 UNITS CAPS capsule Take 1 capsule (50,000 Units total) by mouth every 7 (seven) days. 08/27/13  Yes Kinnie Feil, MD  traMADol (ULTRAM) 50 MG tablet Take 1 tablet (50 mg total) by mouth 2 (two) times daily as needed (for breakthrough pain). Patient not taking: Reported on 10/07/2016 09/16/16   Caren Griffins, MD    Physical Exam: Vitals:   10/07/16 0343 10/07/16 0634 10/07/16 0947  BP: 130/66 113/60 (!) 105/55  Pulse: 89 88 88  Resp: 18 20 20   Temp: 98.9 F (37.2 C)    TempSrc: Oral    SpO2: 93% 97% 96%    Constitutional: NAD, calm, comfortable, slightly lethargic Vitals:   10/07/16 0343 10/07/16 0634 10/07/16 0947  BP: 130/66 113/60 (!) 105/55  Pulse: 89 88 88  Resp: 18 20 20   Temp: 98.9 F (37.2 C)    TempSrc: Oral    SpO2: 93% 97% 96%   Eyes: PERRL, lids and conjunctivae normal ENMT: Mucous membranes are moist. Posterior pharynx clear of any exudate or lesions.Normal  dentition.  Neck: normal, supple, no masses, no thyromegaly Respiratory: Diminished breath sounds, no wheezing  Cardiovascular: Regular rate and rhythm, appreciate S1-S2.  Abdomen: no tenderness, no masses palpated. No hepatosplenomegaly. Bowel sounds positive.  Musculoskeletal: no clubbing / cyanosis. No joint deformity upper and lower extremities. Good ROM, no contractures. Normal muscle tone.  Skin: no rashes, lesions, ulcers. No induration Neurologic: CN 2-12 grossly intact. Sensation intact,  Psychiatric: Unable to adequately assess because patient is slightly lethargic   Labs on Admission: I have personally reviewed following labs and imaging  studies  CBC:  Recent Labs Lab 10/07/16 0558  WBC 25.0*  HGB 11.6*  HCT 35.1*  MCV 91.6  PLT 160   Basic Metabolic Panel:  Recent Labs Lab 10/07/16 0558  NA 141  K 3.8  CL 100*  CO2 28  GLUCOSE 122*  BUN 46*  CREATININE 2.14*  CALCIUM 9.7   GFR: Estimated Creatinine Clearance: 19.9 mL/min (A) (by C-G formula based on SCr of 2.14 mg/dL (H)). Liver Function Tests:  Recent Labs Lab 10/07/16 0558  AST 22  ALT 16  ALKPHOS 47  BILITOT 0.4  PROT 7.1  ALBUMIN 3.6    Recent Labs Lab 10/07/16 0558  LIPASE 21   No results for input(s): AMMONIA in the last 168 hours. Coagulation Profile: No results for input(s): INR, PROTIME in the last 168 hours. Cardiac Enzymes: No results for input(s): CKTOTAL, [name], [name], TROPONINI in the last 168 hours. BNP (last 3 results) No results for input(s): PROBNP in the last 8760 hours. HbA1C: No results for input(s): HGBA1C in the last 72 hours. CBG: No results for input(s): GLUCAP in the last 168 hours. Lipid Profile: No results for input(s): CHOL, [name], [name], [name], [name], LDLDIRECT in the last 72 hours. Thyroid Function Tests: No results for input(s): TSH, T4TOTAL, FREET4, T3FREE, THYROIDAB in the last 72 hours. Anemia Panel: No results for input(s): VITAMINB12,  FOLATE, FERRITIN, TIBC, IRON, RETICCTPCT in the last 72 hours. Urine analysis:    Component Value Date/Time   COLORURINE AMBER (A) 10/07/2016 1000   APPEARANCEUR HAZY (A) 10/07/2016 1000   LABSPEC 1.020 10/07/2016 1000   LABSPEC 1.015 01/06/2015 1542   PHURINE 5.0 10/07/2016 1000   GLUCOSEU NEGATIVE 10/07/2016 1000   GLUCOSEU Negative 01/06/2015 1542   HGBUR NEGATIVE 10/07/2016 1000   HGBUR negative 12/03/2008 1113   BILIRUBINUR NEGATIVE 10/07/2016 1000   BILIRUBINUR Negative 01/06/2015 1542   KETONESUR 5 (A) 10/07/2016 1000   PROTEINUR 30 (A) 10/07/2016 1000   UROBILINOGEN 0.2 01/06/2015 1542   NITRITE NEGATIVE 10/07/2016 1000   LEUKOCYTESUR SMALL (A) 10/07/2016 1000   LEUKOCYTESUR Negative 01/06/2015 1542   Sepsis Labs: @LABRCNTIP (procalcitonin:4,lacticidven:4) )No results found for this or any previous visit (from the past 240 hour(s)).   Radiological Exams on Admission: Ct Abdomen Pelvis Wo Contrast Result Date: 10/07/2016 Dilated loops of jejunum without definitive transition zone. This may represent a small-bowel ileus. Clinical correlation is recommended. Fluid attenuation lesion within the midportion of the pancreas relatively stable from the previous exam consistent with a small pseudocyst. Fecal material throughout the colon consistent with a degree of constipation. Electronically Signed   By: Inez Catalina M.D.   On: 10/07/2016 08:40   Dg Chest 2 View Result Date: 10/07/2016 CLINICAL DATA:  Cough and congestion EXAM: CHEST  2 VIEW COMPARISON:  09/15/2016 FINDINGS: Cardiac shadow is stable. The right lung is well aerated without focal infiltrate. Patchy changes are noted in the left mid lung and lung base increased from the prior exam. No bony abnormality is noted. IMPRESSION: Patchy infiltrates in the left lung as described.    EKG: Independently reviewed. Sinus rhythm.  Assessment/Plan  Principal Problem:   Lobar pneumonia, unspecified organism ([place]) /  leukocytosis - Chest x-ray on the admission shows patchy infiltrates in the left lung concerning for pneumonia - Started empiric azithromycin and Rocephin - Follow-up blood culture, respiratory culture results - Follow up strep pneumonia, Legionella and influenza - Continue oxygen support via nasal cannula to keep oxygen saturation above 90%  Active Problems:  Acute metabolic encephalopathy - Patient is slightly lethargic but also has history of memory deficit - We will continue to monitor her mental status - Obtain PT evaluation - Obtain CT head     Small bowel ileus - CT scan on the admission with possible small bowel ileus but patient did have bowel movement in ED - We'll continue to monitor    Anemia in chronic renal disease - Hemoglobin 11.6    CKD stage 4, GFR 15-29 ml/min  - Creatinine is within baseline range    Coronary artery disease involving native coronary artery of native heart without angina pectoris - Continue aspirin and Plavix    Benign essential HTN - Continue Cardizem, metoprolol, isosorbide    Polymyalgia rheumatica - Resume prednisone     Dyslipidemia - Resume Pravachol    Hypothyroidism - Resume Synthroid   DVT prophylaxis: SCD's bialterally Code Status: full code Family Communication: no family at the bedside  Disposition Plan: Admission to telemetry Consults called: Physical therapy Admission status: Inpatient, patient admitted with altered mental status and pneumonia. She is placed on azithromycin and Rocephin while awaiting blood culture, respiratory culture results as well as strep pneumonia, Legionella and influenza results.   Leisa Lenz MD [facility] Pager [phone]  If 7PM-7AM, please contact night-coverage [email or web address] Password [facility]  10/07/2016, 12:16 PM

## 2016-10-07 NOTE — Progress Notes (Signed)
Discussed w/ Dr Charlies Silvers pt's lethargy will answer questions but falls asleep during conversation. No new orders at this time CT head was completed in ED. Will monitor closely

## 2016-10-07 NOTE — ED Triage Notes (Signed)
Patient arrives by EMS from home with complaints of abdominal pain, vomiting and constipation x 5 days. O2 sat 90-91% and placed on 2l/Ingen[place] by EMS with sat increased to 96. Productive cough with possible aspiration. BP 170/68, HR 88 CBG 134.

## 2016-10-07 NOTE — ED Provider Notes (Signed)
[facility] DEPT Provider Note   CSN: [id number] Arrival date & time: 10/07/16  0932     History   Chief Complaint Chief Complaint  Patient presents with  . Abdominal Pain  . Emesis  . Constipation    HPI Rebecca [name] is a 76 y.o. female.  Rebecca [name] is a 76 y.o. Female with a history of MGUS, COPD, CAD, CKD, diastolic heart failure, and polymyalgia rheumatica who presents to the ED with her daughter with nausea, vomiting and abdominal pain since yesterday. Patient reports bilateral lower abdominal pain as well as multiple episodes of vomiting. She tells me she believes she has a bowel obstruction. She has a history of previous bowel obstruction. Previous abdominal surgical history includes a hysterectomy. She reports she is passing gas. No diarrhea. Daughter reports patient developed chest pain after vomiting and was provided with nitroglycerin without any relief of her symptoms. Suspect reflux.  No current chest pain or shortness of breath. Patient ate hot dogs and beans yesterday and they believe this caused her symptoms. No fevers, urinary symptoms, rashes, hematemesis.    The history is provided by the patient, medical records and a relative. No language interpreter was used.  Abdominal Pain   Associated symptoms include nausea, vomiting and constipation. Pertinent negatives include fever, diarrhea, dysuria, frequency and headaches.  Emesis   Associated symptoms include abdominal pain. Pertinent negatives include no chills, no cough, no diarrhea, no fever and no headaches.  Constipation   Associated symptoms include abdominal pain. Pertinent negatives include no dysuria.    Past Medical History:  Diagnosis Date  . AKI (acute kidney injury) ([place])   . Anemia in chronic renal disease   . CAD (coronary artery disease) 2006   Taxus stent of mid RCA. negative myoview 2014 and normal LV function  . CKD (chronic kidney disease) stage 4, GFR 15-29 ml/min (H[facility])   . Complication  of anesthesia   . COPD (chronic obstructive pulmonary disease) ([place])   . Diabetes mellitus   . Diastolic heart failure, NYHA class 1 ([place])   . Dyslipidemia   . Dysuria 01/06/2015  . Frozen shoulder    right  . Gastroesophageal reflux   . HAP (hospital-acquired pneumonia)   . Headache   . Hyperkalemia 01/01/2016  . Hypothyroidism   . 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 Obesity   . Pneumonia 06/2016  . Polymyalgia rheumatica ([place])   . PONV (postoperative nausea and vomiting)   . Renal insufficiency   . Respiratory distress   . Schizoaffective disorder ([place])   . Sepsis due to pneumonia ([place])   . Shortness of breath   . Stroke [facility])     Patient Active Problem List   Diagnosis Date Noted  . Healthcare-associated pneumonia 09/12/2016  . AKI (acute kidney injury) ([place]) 09/12/2016  . HAP (hospital-acquired pneumonia) 09/12/2016  . Respiratory distress 09/12/2016  . Pressure injury of skin 07/15/2016  . Benign essential HTN 07/14/2016  . Sepsis due to pneumonia ([place]) 07/14/2016  . Acute encephalopathy 07/14/2016  . Coronary artery disease involving native coronary artery of native heart without angina pectoris 05/14/2016  . CKD stage 4, GFR 15-29 ml/min  08/11/2015  . 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 Leukocytosis 01/28/2014  . History of TIA - May 2015- Plavix 10/16/2013  . COPD exacerbation ([place]) 07/16/2013  . Polymyalgia rheumatica ([place]) 10/03/2012  . Trochanteric bursitis of right hip 04/11/2012  . History of small bowel obstruction 11/04/2010  .  Schizoaffective disorder ([place]) 01/10/2008  . Dyslipidemia 01/25/2007  . Hypothyroidism 08/11/2006  . Anemia in chronic renal disease 08/11/2006  . GASTROESOPHAGEAL REFLUX, NO ESOPHAGITIS 08/11/2006    Past Surgical History:  Procedure Laterality Date  . ABDOMINAL HYSTERECTOMY    . CARDIAC CATHETERIZATION N/A 08/13/2015   Procedure: Left Heart Cath and Coronary  Angiography;  Surgeon: Jettie Booze, MD;  Location: [facility];  Service: Cardiovascular;  Laterality: N/A;  . CATARACT EXTRACTION    . CHOLECYSTECTOMY    . CORONARY ANGIOPLASTY WITH STENT PLACEMENT  2006   Taxus DES to RCA, 50 % residual  . PARTIAL HYSTERECTOMY  1995    OB History    No data available       Home Medications    Prior to Admission medications   Medication Sig Start Date End Date Taking? Authorizing Provider  acetaminophen (TYLENOL) 500 MG tablet Take 1,000 mg by mouth every 6 (six) hours as needed (pain).     Historical Provider, MD  albuterol (PROAIR HFA) 108 (90 Base) MCG/ACT inhaler Inhale 2 puffs into the lungs every 6 (six) hours as needed for wheezing or shortness of breath.    Historical Provider, MD  aspirin EC 81 MG EC tablet Take 1 tablet (81 mg total) by mouth daily. 07/20/16   Robbie Lis, MD  cetirizine (ZYRTEC) 10 MG tablet Take 10 mg by mouth at bedtime.     Historical Provider, MD  clopidogrel (PLAVIX) 75 MG tablet Take 1 tablet (75 mg total) by mouth daily with breakfast. 10/18/13   Thurnell Lose, MD  clozapine (CLOZARIL) 200 MG tablet Take 300 mg by mouth at bedtime. ]    Historical Provider, MD  Cranberry 250 MG TABS Take 1 tablet by mouth at bedtime.    Historical Provider, MD  denosumab (PROLIA) 60 MG/ML SOLN injection Inject 60 mg into the skin every 6 (six) months. Administer in upper arm, thigh, or abdomen    Historical Provider, MD  dextromethorphan-guaiFENesin (ROBITUSSIN-DM) 10-100 MG/5ML liquid Take 10 mLs by mouth every 6 (six) hours as needed for cough.    Historical Provider, MD  diltiazem (CARDIZEM LA) 180 MG 24 hr tablet Take 180 mg by mouth daily with breakfast. 07/16/15   Historical Provider, MD  docusate sodium (COLACE) 100 MG capsule Take 200 mg by mouth at bedtime.  03/01/12   Katherina Mires, MD  Fluticasone-Salmeterol (ADVAIR) 250-50 MCG/DOSE AEPB Inhale 1 puff into the lungs at bedtime.    Historical Provider, MD    HYDROcodone-homatropine (HYCODAN) 5-1.5 MG/5ML syrup Take 5 mLs by mouth 3 (three) times daily. 09/27/16   Heath Lark, MD  Hydrocortisone (GERHARDT'S BUTT CREAM) CREA Apply 1 application topically 3 (three) times daily. Apply to sacral region every shift for stage 2 pressure injury.    Historical Provider, MD  isosorbide mononitrate (IMDUR) 60 MG 24 hr tablet Take 1 tablet (60 mg total) by mouth daily. 03/14/13   Isaiah Serge, NP  levothyroxine (SYNTHROID, LEVOTHROID) 75 MCG tablet Take 75 mcg by mouth daily before breakfast.    Historical Provider, MD  metoprolol succinate (TOPROL-XL) 100 MG 24 hr tablet Take 100 mg by mouth daily with breakfast. Take with or immediately following a meal.    Historical Provider, MD  Multiple Vitamins-Minerals (MULTIVITAMIN WITH MINERALS) tablet Take 1 tablet by mouth daily.    Historical Provider, MD  nitroGLYCERIN (NITROSTAT) 0.4 MG SL tablet Place 1 tablet (0.4 mg total) under the tongue every 5 (five)  minutes as needed. For chest pain 01/07/15   Pixie Casino, MD  pantoprazole (PROTONIX) 40 MG tablet Take 1 tablet (40 mg total) by mouth daily. 04/03/13   Pixie Casino, MD  polyethylene glycol powder (GLYCOLAX/MIRALAX) powder Take 17 g by mouth daily. [place]    Historical Provider, MD  pravastatin (PRAVACHOL) 20 MG tablet Take 20 mg by mouth at bedtime.    Historical Provider, MD  predniSONE (DELTASONE) 20 MG tablet Take 1 tablet (20 mg total) by mouth daily with breakfast. 09/27/16   Heath Lark, MD  Tiotropium Bromide Monohydrate (SPIRIVA RESPIMAT) 2.5 MCG/ACT AERS Inhale 2 puffs into the lungs every morning.     Historical Provider, MD  traMADol (ULTRAM) 50 MG tablet Take 1 tablet (50 mg total) by mouth 2 (two) times daily as needed (for breakthrough pain). 09/16/16   Costin Karlyne Greenspan, MD  traMADol (ULTRAM) 50 MG tablet Take 100 mg by mouth 2 (two) times daily as needed.    Historical Provider, MD  Vitamin D, Ergocalciferol, (DRISDOL) 50000 UNITS CAPS  capsule Take 1 capsule (50,000 Units total) by mouth every 7 (seven) days. 08/27/13   Kinnie Feil, MD    Family History Family History  Problem Relation Age of Onset  . Diabetes Mother   . Heart disease Mother   . Heart disease Father   . Arthritis Father   . Healthy Sister   . Heart disease Brother   . Cancer Brother     colon ca  . Osteoporosis Sister   . Heart disease Brother   . Diabetes Brother   . Heart disease Brother   . Diabetes Brother   . Diabetes Brother   . Healthy Brother   . Healthy Brother   . Healthy Brother   . Cancer Maternal Uncle     blood condition  . Cancer Daughter     breast ca  . Cancer Cousin     NHL    Social History Social History  Substance Use Topics  . Smoking status: Former Smoker    Packs/day: 1.00    Years: 35.00    Quit date: 04/14/2004  . Smokeless tobacco: Never Used  . Alcohol use No     Allergies   Levaquin [levofloxacin] and Tape   Review of Systems Review of Systems  Constitutional: Negative for chills and fever.  HENT: Negative for congestion and sore throat.   Eyes: Negative for visual disturbance.  Respiratory: Negative for cough and shortness of breath.   Cardiovascular: Negative for chest pain.  Gastrointestinal: Positive for abdominal pain, constipation, nausea and vomiting. Negative for blood in stool and diarrhea.  Genitourinary: Negative for difficulty urinating, dysuria, frequency and urgency.  Musculoskeletal: Negative for back pain and neck pain.  Skin: Negative for rash.  Neurological: Negative for light-headedness and headaches.     Physical Exam Updated Vital Signs BP 130/66 (BP Location: Left Arm)   Pulse 89   Temp 98.9 F (37.2 C) (Oral)   Resp 18   SpO2 93%   Physical Exam  Constitutional: She appears well-developed and well-nourished. No distress.  Non-toxic appearing.   HENT:  Head: Normocephalic and atraumatic.  Mouth/Throat: Oropharynx is clear and moist.  Eyes: Conjunctivae  are normal. Pupils are equal, round, and reactive to light. Right eye exhibits no discharge. Left eye exhibits no discharge.  Neck: Normal range of motion. Neck supple. No JVD present.  Cardiovascular: Normal rate, regular rhythm, normal heart sounds and intact distal pulses.  Exam reveals no gallop and no friction rub.   No murmur heard. Pulmonary/Chest: Effort normal and breath sounds normal. No stridor. No respiratory distress. She has no wheezes. She has no rales.  Abdominal: Soft. Bowel sounds are normal. She exhibits no mass. There is tenderness. There is no rebound and no guarding.  Abdomen is soft. Bowel sounds are present. Patient is left lower quadrant abdominal tenderness to palpation.  Musculoskeletal: She exhibits no edema.  Lymphadenopathy:    She has no cervical adenopathy.  Neurological: She is alert. Coordination normal.  Alert and pleasant.   Skin: Skin is warm and dry. Capillary refill takes less than 2 seconds. No rash noted. She is not diaphoretic. No erythema. No pallor.  Psychiatric: She has a normal mood and affect. Her behavior is normal.  Nursing note and vitals reviewed.    ED Treatments / Results  Labs (all labs ordered are listed, but only abnormal results are displayed) Labs Reviewed  CBC - Abnormal; Notable for the following:       Result Value   WBC 25.0 (*)    RBC 3.83 (*)    Hemoglobin 11.6 (*)    HCT 35.1 (*)    All other components within normal limits  LIPASE, BLOOD  COMPREHENSIVE METABOLIC PANEL  URINALYSIS, ROUTINE W REFLEX MICROSCOPIC    EKG  EKG Interpretation  Date/Time:  Thursday October 07 2016 06:33:52 EDT Ventricular Rate:  88 PR Interval:    QRS Duration: 93 QT Interval:  352 QTC Calculation: 426 R Axis:   43 Text Interpretation:  Sinus rhythm Confirmed by [facility]  MD, APRIL ([id number]) on 10/07/2016 6:52:02 AM       Radiology Ct Abdomen Pelvis Wo Contrast  Result Date: 10/07/2016 CLINICAL DATA:  Abdominal pain and  vomiting EXAM: CT ABDOMEN AND PELVIS WITHOUT CONTRAST TECHNIQUE: Multidetector CT imaging of the abdomen and pelvis was performed following the standard protocol without IV contrast. COMPARISON:  09/25/2015 FINDINGS: Lower chest: Bibasilar scarring is again identified. Coronary calcifications are seen. Mild reflux is noted in the distal esophagus. Hepatobiliary: No focal liver abnormality is seen. Status post cholecystectomy. No biliary dilatation. Pancreas: Pancreas is well visualized. A fluid attenuation area is noted adjacent to the body of the pancreas which measures approximately 2.7 cm. In retrospect this is stable from the prior exam and likely represents a small pseudocyst. The pancreas is otherwise within normal limits. Spleen: Normal in size without focal abnormality. Adrenals/Urinary Tract: The adrenal glands are within normal limits. The kidneys are well visualized without renal calculi or obstructive changes. A exophytic cyst is noted arising from the posterior aspect of the mid to lower pole stable from the prior exam. Smaller left renal cyst is also noted stable from the previous study. The bladder is partially distended. Some air is noted within the bladder which may be related to recent instrumentation. Stomach/Bowel: Considerable fecal material is noted throughout the colon. No true colonic obstructive changes are seen. In the mid jejunum however there are multiple dilated loops of small bowel identified. The ileum appears within normal limits. No true transition zone is identified. This may represent a small-bowel ileus although the possibility of adhesions would deserve consideration. Vascular/Lymphatic: Aortic atherosclerosis. No enlarged abdominal or pelvic lymph nodes. Reproductive: Status post hysterectomy. No adnexal masses. Other: No abdominal wall hernia or abnormality. No abdominopelvic ascites. Musculoskeletal: Degenerative changes of the lumbar spine are seen. No acute bony abnormality  is noted. IMPRESSION: Dilated loops of jejunum without definitive transition zone. This  may represent a small-bowel ileus. Clinical correlation is recommended. Fluid attenuation lesion within the midportion of the pancreas relatively stable from the previous exam consistent with a small pseudocyst. Fecal material throughout the colon consistent with a degree of constipation. Electronically Signed   By: Inez Catalina M.D.   On: 10/07/2016 08:40   Dg Chest 2 View  Result Date: 10/07/2016 CLINICAL DATA:  Cough and congestion EXAM: CHEST  2 VIEW COMPARISON:  09/15/2016 FINDINGS: Cardiac shadow is stable. The right lung is well aerated without focal infiltrate. Patchy changes are noted in the left mid lung and lung base increased from the prior exam. No bony abnormality is noted. IMPRESSION: Patchy infiltrates in the left lung as described. Electronically Signed   By: Inez Catalina M.D.   On: 10/07/2016 10:41   Ct Head Wo Contrast  Result Date: 10/07/2016 CLINICAL DATA:  76 year old female with acute encephalopathy and altered mental status. EXAM: CT HEAD WITHOUT CONTRAST TECHNIQUE: Contiguous axial images were obtained from the base of the skull through the vertex without intravenous contrast. COMPARISON:  09/12/2016 and prior CTs FINDINGS: Brain: No evidence of acute infarction, hemorrhage, hydrocephalus, extra-axial collection or mass lesion/mass effect. Chronic small-vessel white matter ischemic changes and a remote right cerebellar infarct are again noted. Vascular: Mild intracranial atherosclerotic calcifications noted. Skull: Normal. Negative for fracture or focal lesion. Sinuses/Orbits: No acute finding. Other: None. IMPRESSION: No evidence of acute intracranial abnormality. Chronic small-vessel white matter ischemic changes and remote right cerebellar infarct. Electronically Signed   By: Margarette Canada M.D.   On: 10/07/2016 14:45   Dg Chest Port 1 View  Result Date: 10/07/2016 CLINICAL DATA:  Altered  mental status. EXAM: PORTABLE CHEST 1 VIEW COMPARISON:  10/07/2016 and prior radiographs FINDINGS: The cardiomediastinal silhouette is unremarkable. Left basilar opacity/ atelectasis again noted. Patchy left upper lung airspace disease is unchanged. The right lung is clear. There is no evidence of pneumothorax. IMPRESSION: Little significant change with left basilar opacity/atelectasis and patchy left upper lung airspace disease again noted. Electronically Signed   By: Margarette Canada M.D.   On: 10/07/2016 14:31    Procedures Procedures (including critical care time)  Medications Ordered in ED Medications  ondansetron (ZOFRAN) injection 4 mg (4 mg Intravenous Given 10/07/16 0558)     Initial Impression / Assessment and Plan / ED Course  I have reviewed the triage vital signs and the nursing notes.  Pertinent labs & imaging results that were available during my care of the patient were reviewed by me and considered in my medical decision making (see chart for details).    This  is a 76 y.o. Female with a history of MGUS, COPD, CAD, CKD, diastolic heart failure, and polymyalgia rheumatica who presents to the ED with her daughter with nausea, vomiting and abdominal pain since yesterday. Patient reports bilateral lower abdominal pain as well as multiple episodes of vomiting. She tells me she believes she has a bowel obstruction. She has a history of previous bowel obstruction. Previous abdominal surgical history includes a hysterectomy. She reports she is passing gas. No diarrhea. Daughter reports patient developed chest pain after vomiting and was provided with nitroglycerin without any relief of her symptoms. Suspect reflux.  No current chest pain or shortness of breath.  On exam patient is afebrile nontoxic appearing. She has left lower quadrant abdominal tenderness to palpation. Bowel sounds are present. Abdomen is soft. Later, the patient's daughter tells me she has been coughing more. Will obtain  chest xray.  CBC is remarkable for white count of 25,000. Patient does have a history of leukocytosis and is on chronic steroids which were recently increased by 5 mg about 1 week ago. This might contribute to her elevated white count or infection is also likely. CMP reveals an elevated creatinine of 2.14. This is an increase from her baseline. Urinalysis shows small leukocytes with many bacteria. Urine sent for culture. Lipase is within normal limits.  CT abdomen and pelvis without contrast shows dilated loops of jejunum without definitive transition zone. This may represent a small bowel ileus. There is also fecal material throughout the colon consistent with degree of constipation. Chest x-ray indicates patchy infiltrates.  After discussion with Dr. Ellender Hose, [facility] cover with antibiotics for community-acquired pneumonia and plan for admission for ileus pneumonia as well as elevated creatinine. Daughter and patient agree with plan for admission.   She ambulated to the bathroom and had a bowel movement according to nursing staff at revaluation.   I consulted with hospitalist Dr. Charlies Silvers who accepted the patient for admission.  Later, after admission, Dr. Charlies Silvers reports patient seems more sleepy. I went by to see the patient where she is asleep in bed. She is arousable. She has good blood pressure and oxygen saturation is 98% on room air. She does seem sleepier than at initial evaluation. I discussed this with Dr. Charlies Silvers. Plan for repeat CXR, blood sugar and head CT. Still plan for admission.   This patient was discussed with and evaluated by Dr. Randal Buba who agrees with assessment and plan.    Final Clinical Impressions(s) / ED Diagnoses   Final diagnoses:  Ileus ([place])  Other constipation  Non-intractable vomiting with nausea, unspecified vomiting type  Community acquired pneumonia of left lower lobe of lung [facility])    New Prescriptions New Prescriptions   No medications on file     Waynetta Pean, PA-C 10/07/16 1620    April Palumbo, MD 10/07/16 2305

## 2016-10-07 NOTE — ED Notes (Signed)
Unable to collect labs at this time PT currently not in room 

## 2016-10-08 ENCOUNTER — Inpatient Hospital Stay (HOSPITAL_COMMUNITY): Payer: Medicare Other

## 2016-10-08 DIAGNOSIS — N183 Chronic kidney disease, stage 3 (moderate): Secondary | ICD-10-CM

## 2016-10-08 DIAGNOSIS — D72829 Elevated white blood cell count, unspecified: Secondary | ICD-10-CM

## 2016-10-08 DIAGNOSIS — N390 Urinary tract infection, site not specified: Secondary | ICD-10-CM

## 2016-10-08 DIAGNOSIS — B9620 Unspecified Escherichia coli [E. coli] as the cause of diseases classified elsewhere: Secondary | ICD-10-CM

## 2016-10-08 LAB — RESPIRATORY PANEL BY PCR
ADENOVIRUS-RVPPCR: NOT DETECTED
BORDETELLA PERTUSSIS-RVPCR: NOT DETECTED
CORONAVIRUS 229E-RVPPCR: NOT DETECTED
CORONAVIRUS OC43-RVPPCR: NOT DETECTED
Chlamydophila pneumoniae: NOT DETECTED
Coronavirus HKU1: NOT DETECTED
Coronavirus NL63: NOT DETECTED
INFLUENZA A-RVPPCR: NOT DETECTED
INFLUENZA B-RVPPCR: NOT DETECTED
METAPNEUMOVIRUS-RVPPCR: NOT DETECTED
MYCOPLASMA PNEUMONIAE-RVPPCR: NOT DETECTED
PARAINFLUENZA VIRUS 1-RVPPCR: NOT DETECTED
PARAINFLUENZA VIRUS 2-RVPPCR: NOT DETECTED
PARAINFLUENZA VIRUS 4-RVPPCR: NOT DETECTED
Parainfluenza Virus 3: NOT DETECTED
RESPIRATORY SYNCYTIAL VIRUS-RVPPCR: NOT DETECTED
Rhinovirus / Enterovirus: NOT DETECTED

## 2016-10-08 LAB — BASIC METABOLIC PANEL
Anion gap: 11 (ref 5–15)
BUN: 40 mg/dL — AB (ref 6–20)
CALCIUM: 8.7 mg/dL — AB (ref 8.9–10.3)
CHLORIDE: 107 mmol/L (ref 101–111)
CO2: 25 mmol/L (ref 22–32)
CREATININE: 1.96 mg/dL — AB (ref 0.44–1.00)
GFR, EST AFRICAN AMERICAN: 27 mL/min — AB (ref >60)
GFR, EST NON AFRICAN AMERICAN: 23 mL/min — AB (ref >60)
Glucose, Bld: 87 mg/dL (ref 65–99)
Potassium: 4.1 mmol/L (ref 3.5–5.1)
SODIUM: 143 mmol/L (ref 135–145)

## 2016-10-08 LAB — GLUCOSE, CAPILLARY
GLUCOSE-CAPILLARY: 80 mg/dL (ref 65–99)
GLUCOSE-CAPILLARY: 210 mg/dL — AB (ref 65–99)
Glucose-Capillary: 261 mg/dL — ABNORMAL HIGH (ref 65–99)
Glucose-Capillary: 148 mg/dL — ABNORMAL HIGH (ref 65–99)

## 2016-10-08 LAB — CBC
HCT: 32.4 % — ABNORMAL LOW (ref 36.0–46.0)
Hemoglobin: 10.4 g/dL — ABNORMAL LOW (ref 12.0–15.0)
MCH: 30.3 pg (ref 26.0–34.0)
MCHC: 32.1 g/dL (ref 30.0–36.0)
MCV: 94.5 fL (ref 78.0–100.0)
PLATELETS: 202 10*3/uL (ref 150–400)
RBC: 3.43 MIL/uL — AB (ref 3.87–5.11)
RDW: 14.3 % (ref 11.5–15.5)
WBC: 18.3 10*3/uL — AB (ref 4.0–10.5)

## 2016-10-08 LAB — HIV ANTIBODY (ROUTINE TESTING W REFLEX): HIV Screen 4th Generation wRfx: NONREACTIVE

## 2016-10-08 MED ORDER — ADULT MULTIVITAMIN W/MINERALS CH
1.0000 | ORAL_TABLET | Freq: Every day | ORAL | Status: DC
  Administered 2016-10-08 – 2016-10-10 (×3): 1 via ORAL
  Filled 2016-10-08 (×3): qty 1

## 2016-10-08 NOTE — Progress Notes (Signed)
Pt daughter requesting pt to have something for her anxiety. MD notified. Will continue to monitor.

## 2016-10-08 NOTE — Evaluation (Signed)
Clinical/Bedside Swallow Evaluation Patient Details  Name: Rebecca [name] MRN: [id number] Date of Birth: 10-14-1940  Today's Date: 10/08/2016 Time: SLP Start Time (ACUTE ONLY): 1500 SLP Stop Time (ACUTE ONLY): 1515 SLP Time Calculation (min) (ACUTE ONLY): 15 min  Past Medical History:  Past Medical History:  Diagnosis Date  . AKI (acute kidney injury) ([place])   . Anemia in chronic renal disease   . CAD (coronary artery disease) 2006   Taxus stent of mid RCA. negative myoview 2014 and normal LV function  . CKD (chronic kidney disease) stage 4, GFR 15-29 ml/min ([facility])   . Complication of anesthesia   . COPD (chronic obstructive pulmonary disease) ([place])   . Diabetes mellitus   . Diastolic heart failure, NYHA class 1 ([place])   . Dyslipidemia   . Dysuria 01/06/2015  . Frozen shoulder    right  . Gastroesophageal reflux   . HAP (hospital-acquired pneumonia)   . Headache   . Hyperkalemia 01/01/2016  . Hypothyroidism   . 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 Obesity   . Pneumonia 06/2016  . Polymyalgia rheumatica ([place])   . PONV (postoperative nausea and vomiting)   . Renal insufficiency   . Respiratory distress   . Schizoaffective disorder ([place])   . Sepsis due to pneumonia ([place])   . Shortness of breath   . Stroke [facility])    Past Surgical History:  Past Surgical History:  Procedure Laterality Date  . ABDOMINAL HYSTERECTOMY    . CARDIAC CATHETERIZATION N/A 08/13/2015   Procedure: Left Heart Cath and Coronary Angiography;  Surgeon: Jettie Booze, MD;  Location: [facility];  Service: Cardiovascular;  Laterality: N/A;  . CATARACT EXTRACTION    . CHOLECYSTECTOMY    . CORONARY ANGIOPLASTY WITH STENT PLACEMENT  2006   Taxus DES to RCA, 50 % residual  . PARTIAL HYSTERECTOMY  [id number]   HPI:  76 year old female with history of MGUS, mild dementia, hypertension, hypothyroidism, dyslipidemia, PMR on low-dose prednisone , recent hospitalization with sepsis due  to influenza B and left sided PNA, presented with ongoing nausea, vomiting and lower abdominal pain for 1 day prior to admission.  Dx PNA (third hospitalization for pna in three months), UTI, anemia, acute encephalopathy.  SLP asked to evaluate to r/o aspiration.    Assessment / Plan / Recommendation Clinical Impression  Pt presents with concerns for potential aspiration with thin liquids - failed three oz water test, which elicited immediate and prolonged coughing.  Further assessment did not reveal ongoing difficulty - there was adequate mastication, brisk swallow response, and no further incidents of coughing, nor other s/s of aspiration.  However, given performance and hx of recurring pna, recommend proceeding with MBS next date to further evaluate swallow function.   SLP Visit Diagnosis: Dysphagia, unspecified (R13.10)    Aspiration Risk  Mild aspiration risk    Diet Recommendation  continue regular, thins pending MBS   Medication Administration: Whole meds with puree    Other  Recommendations Oral Care Recommendations: Oral care BID   Follow up Recommendations  (tba)      Frequency and Duration            Prognosis        Swallow Study   General Date of Onset: 10/07/16 HPI: 76 year old female with history of MGUS, mild dementia, hypertension, hypothyroidism, dyslipidemia, PMR on low-dose prednisone , recent hospitalization with sepsis due to influenza B and left sided PNA, presented with ongoing nausea, vomiting and  lower abdominal pain for 1 day prior to admission.  Dx PNA (third hospitalization for pna in three months), UTI, anemia, acute encephalopathy.  SLP asked to evaluate to r/o aspiration.  Type of Study: Bedside Swallow Evaluation Previous Swallow Assessment: no Diet Prior to this Study: Regular;Thin liquids Temperature Spikes Noted: No Respiratory Status: Room air History of Recent Intubation: No Behavior/Cognition: Alert;Confused Oral Cavity Assessment: Within  Functional Limits Oral Care Completed by SLP: No Oral Cavity - Dentition: Dentures, bottom Vision: Functional for self-feeding Self-Feeding Abilities: Able to feed self Patient Positioning: Upright in chair Baseline Vocal Quality: Normal Volitional Cough: Strong Volitional Swallow: Able to elicit    Oral/Motor/Sensory Function Overall Oral Motor/Sensory Function: Within functional limits   Ice Chips Ice chips: Within functional limits   Thin Liquid Thin Liquid: Impaired Presentation: Cup Pharyngeal  Phase Impairments: Cough - Immediate    Nectar Thick Nectar Thick Liquid: Not tested   Honey Thick Honey Thick Liquid: Not tested   Puree Puree: Within functional limits   Solid   GO   Solid: Within functional limits        Juan Quam Laurice 10/08/2016,3:36 PM  [name] L. Tivis Ringer, [place] CCC/SLP Pager [phone]

## 2016-10-08 NOTE — Evaluation (Signed)
Physical Therapy Evaluation Patient Details Name: Rebecca [name] MRN: [id number] DOB: 1940-08-21 Today's Date: 10/08/2016   History of Present Illness  76 yo female admitted with Pna, metabolic encephalopathy, SB ileus. Hx of + flu, MGUS, DM, peripheral neuropathy, polymyalgia rheumatica, COPD, CAD,CKD, HF  Clinical Impression  On eval, pt required Min assist for mobility. At start of session, pt requesting assistance back into bed. Pt c/o L side/hip pain with standing. She did agree to take a short walk in room before getting back into bed. No family present during session. Per SW note, daughter is refusing placement and prefers for pt to return home. Recommend HHPT and 24 hour supervision/assist. Will follow and progress activity as tolerated. Recommend daily ambulation with nursing as well.     Follow Up Recommendations Home health PT;Supervision/Assistance - 24 hour (daughter refusing placement per SW note)    Equipment Recommendations  None recommended by PT    Recommendations for Other Services       Precautions / Restrictions Precautions Precautions: Fall Restrictions Weight Bearing Restrictions: No      Mobility  Bed Mobility Overal bed mobility: Modified Independent                Transfers Overall transfer level: Needs assistance   Transfers: Sit to/from Stand;Stand Pivot Transfers Sit to Stand: Min assist Stand pivot transfers: Min assist       General transfer comment: Assist to rise, stabilize, control descent. Stand pivot, recliner to bsc-pt held onto armrests  Ambulation/Gait Ambulation/Gait assistance: Min assist Ambulation Distance (Feet): 15 Feet Assistive device: 1 person hand held assist Gait Pattern/deviations: Decreased step length - right;Decreased stride length     General Gait Details: Assist to stabilize. Pt c/o L side/hip pain. Pt was agreeable to walk a short distance in room.   Stairs            Wheelchair Mobility     Modified Rankin (Stroke Patients Only)       Balance Overall balance assessment: Needs assistance;History of Falls           Standing balance-Leahy Scale: Poor                               Pertinent Vitals/Pain Pain Assessment: Faces Faces Pain Scale: Hurts even more Pain Location: L side/posterior hip Pain Descriptors / Indicators: Grimacing;Sore Pain Intervention(s): Limited activity within patient's tolerance    Home Living Family/patient expects to be discharged to:: Private residence Living Arrangements: Children Available Help at Discharge: Family Type of Home: House Home Access: Stairs to enter Entrance Stairs-Rails: Right Entrance Stairs-Number of Steps: 4 Home Layout: Two level;Able to live on main level with bedroom/bathroom Home Equipment: Shower seat;Walker - 2 wheels;Walker - 4 wheels;Hospital bed;Bedside commode      Prior Function Level of Independence: Independent with assistive device(s)         Comments: used rollator when out of the house.       Hand Dominance        Extremity/Trunk Assessment   Upper Extremity Assessment Upper Extremity Assessment: Generalized weakness    Lower Extremity Assessment Lower Extremity Assessment: Generalized weakness    Cervical / Trunk Assessment Cervical / Trunk Assessment: Kyphotic  Communication   Communication: No difficulties  Cognition Arousal/Alertness: Awake/alert Behavior During Therapy: WFL for tasks assessed/performed Overall Cognitive Status: Impaired/Different from baseline Area of Impairment: Memory  General Comments      Exercises     Assessment/Plan    PT Assessment Patient needs continued PT services  PT Problem List Decreased strength;Decreased mobility;Decreased activity tolerance;Decreased balance;Decreased knowledge of use of DME;Pain;Decreased cognition       PT Treatment Interventions DME  instruction;Gait training;Therapeutic activities;Therapeutic exercise;Patient/family education;Balance training;Functional mobility training    PT Goals (Current goals can be found in the Care Plan section)  Acute Rehab PT Goals Patient Stated Goal: agreed to getting up. PT Goal Formulation: With patient Time For Goal Achievement: 10/22/16 Potential to Achieve Goals: Good    Frequency Min 3X/week   Barriers to discharge        Co-evaluation               End of Session   Activity Tolerance: Patient limited by fatigue;Patient limited by pain Patient left: in bed;with call bell/phone within reach;with bed alarm set   PT Visit Diagnosis: Muscle weakness (generalized) (M62.81);Difficulty in walking, not elsewhere classified (R26.2)    Time: 1050-1109 PT Time Calculation (min) (ACUTE ONLY): 19 min   Charges:   PT Evaluation $PT Eval Low Complexity: 1 Procedure     PT G Codes:          Rebecca [name], MPT Pager: [phone]

## 2016-10-08 NOTE — Progress Notes (Addendum)
PROGRESS NOTE                                                                                                                                                                                                             Patient Demographics:    Rebecca [name], is a 76 y.o. female, DOB - July 23, 1940, ZJI:[id number]  Admit date - 10/07/2016   Admitting Physician Robbie Lis, MD  Outpatient Primary MD for the patient is [name], MD  LOS - 1  Outpatient Specialists:[name]  Chief Complaint  Patient presents with  . Abdominal Pain  . Emesis  . Constipation       Brief Narrative   76 year old female with history of MGUS, mild dementia, hypertension, hypothyroidism, dyslipidemia, PMR on low-dose prednisone , recent hospitalization with sepsis due to influenza B and left sided PNA, presented with ongoing nausea, vomiting and lower abdominal pain for 1 day prior to admission. Patient was patient was found to have acute encephalopathy as well. In the ED vitals were stable. She has significant leukocytosis of 25 daily, baseline hemoglobin and creatinine. Chest excision showed patchy infiltrate in the left lung and she was started on empiric Rocephin and azithromycin for possible pneumonia. CT of the abdomen showed possible small bowel ileus but clinically she had no signs of bowel obstruction and had a bowel movement in the ED.    Subjective:   Patient complains of cough with some whitish phlegm. No further nausea or vomiting.   Assessment  & Plan :    Principal Problem:   Lobar pneumonia, unspecified organism ([place]) Empiric Rocephin and azithromycin. Blood cultures on admission negative. Respiratory viral panel and flu PCR negative as well. Strep pneumonia antigen negative. Supportive care with Tylenol and antitussives. Leukocytosis improving.  Recurrent hospitalization for PNA. ( 3rd episode in 3 months). Check on  contrast chest CT to r/o any mass or postobstructive pneumonia. Also obtain swallow evaluation to rule out aspiration.   Active Problems:  UTI Urine culture growing Escherichia coli. Follow sensitivity. She was treated for Escherichia coli UTI during her last hospitalization 3 weeks back as well.  Acute metabolic encephalopathy Lethargic on presentation. Possibly due to dehydration and infection (pneumonia/UTI). Now resolved.    Anemia in chronic renal disease Stable at baseline.     CKD stage 4, GFR 15-29 ml/min  Renal function at baseline. Monitor.    Coronary artery disease involving native coronary artery of native heart without angina pectoris Continue aspirin, Plavix and statin.Marland Kitchen    COPD  Stable. Continue home inhalers.    Benign essential HTN Stable. Continue home medications.  Polymyalgia rheumatica Continue low-dose prednisone.  Hypothyroidism Continue Synthroid   ? OSA Daughter reports being told that patient may have sleep apnea and needs sleep study done as outpatient. (Since patient has been in and out of the hospital and in that he have this has not been done). Can be arranged by her PCP.    Code Status : Full code  Family Communication  : Discussed with detail with the daughter on the phone.  Disposition Plan  : Home possibly in the next 48 hours if improving  Barriers For Discharge : Active symptoms  Consults  : None  Procedures  : CT abdomen and pelvis  DVT Prophylaxis  : SCDs  Lab Results  Component Value Date   PLT 202 10/08/2016    Antibiotics  :  Anti-infectives    Start     Dose/Rate Route Frequency Ordered Stop   10/08/16 1200  cefTRIAXone (ROCEPHIN) 1 g in dextrose 5 % 50 mL IVPB     1 g 100 mL/hr over 30 Minutes Intravenous Every 24 hours 10/07/16 1235 10/14/16 1159   10/08/16 1100  azithromycin (ZITHROMAX) 500 mg in dextrose 5 % 250 mL IVPB     500 mg 250 mL/hr over 60 Minutes Intravenous Every 24 hours 10/07/16 1235  10/14/16 1059   10/07/16 1115  cefTRIAXone (ROCEPHIN) 1 g in dextrose 5 % 50 mL IVPB     1 g 100 mL/hr over 30 Minutes Intravenous  Once 10/07/16 1108 10/07/16 1205   10/07/16 1115  azithromycin (ZITHROMAX) 500 mg in dextrose 5 % 250 mL IVPB     500 mg 250 mL/hr over 60 Minutes Intravenous  Once 10/07/16 1108 10/07/16 1239        Objective:   Vitals:   10/07/16 2111 10/08/16 0533 10/08/16 0816 10/08/16 1355  BP:  140/80  (!) 141/59  Pulse:  (!) 101  84  Resp:  20  18  Temp:  99 F (37.2 C)  98.2 F (36.8 C)  TempSrc:  Oral  Oral  SpO2: (!) 89% 93% 96% 99%  Weight:      Height:        Wt Readings from Last 3 Encounters:  10/07/16 65.5 kg (144 lb 6.4 oz)  09/29/16 65 kg (143 lb 3.2 oz)  09/27/16 69.7 kg (153 lb 11.2 oz)     Intake/Output Summary (Last 24 hours) at 10/08/16 1357 Last data filed at 10/08/16 0914  Gross per 24 hour  Intake               60 ml  Output              400 ml  Net             -340 ml     Physical Exam  Gen: not in distressAppears fatigued HEENT: moist mucosa, supple neck Chest: Fine bibasilar crackles CVS: N S1&S2, no murmurs, GI: soft, NT, ND, BS+ Musculoskeletal: warm, no edema     Data Review:    CBC  Recent Labs Lab 10/07/16 0558 10/08/16 0616  WBC 25.0* 18.3*  HGB 11.6* 10.4*  HCT 35.1* 32.4*  PLT 255 202  MCV 91.6 94.5  MCH 30.3 30.3  MCHC  33.0 32.1  RDW 14.0 14.3  LYMPHSABS 3.0  --   MONOABS 1.8*  --   EOSABS 0.3  --   BASOSABS 0.0  --     Chemistries   Recent Labs Lab 10/07/16 0558 10/08/16 0616  NA 141 143  K 3.8 4.1  CL 100* 107  CO2 28 25  GLUCOSE 122* 87  BUN 46* 40*  CREATININE 2.14* 1.96*  CALCIUM 9.7 8.7*  AST 22  --   ALT 16  --   ALKPHOS 47  --   BILITOT 0.4  --    ------------------------------------------------------------------------------------------------------------------ No results for input(s): CHOL, [name], [name], [name], [name], LDLDIRECT in the last 72 hours.  Lab Results   Component Value Date   HGBA1C 5.7 (H) 10/17/2013   ------------------------------------------------------------------------------------------------------------------ No results for input(s): TSH, T4TOTAL, T3FREE, THYROIDAB in the last 72 hours.  Invalid input(s): FREET3 ------------------------------------------------------------------------------------------------------------------ No results for input(s): VITAMINB12, FOLATE, FERRITIN, TIBC, IRON, RETICCTPCT in the last 72 hours.  Coagulation profile No results for input(s): INR, PROTIME in the last 168 hours.  No results for input(s): DDIMER in the last 72 hours.  Cardiac Enzymes No results for input(s): [name], TROPONINI, MYOGLOBIN in the last 168 hours.  Invalid input(s): CK ------------------------------------------------------------------------------------------------------------------    Component Value Date/Time   BNP 159.5 (H) 07/14/2016 1525   BNP 56.1 08/19/2015 1026    Inpatient Medications  Scheduled Meds: . aspirin EC  81 mg Oral Daily  . clopidogrel  75 mg Oral Q breakfast  . clozapine  300 mg Oral QHS  . diltiazem  180 mg Oral Q breakfast  . docusate sodium  200 mg Oral QHS  . HYDROcodone-homatropine  5 mL Oral TID  . insulin aspart  0-9 Units Subcutaneous TID WC  . ipratropium-albuterol  3 mL Nebulization TID  . isosorbide mononitrate  60 mg Oral Daily  . levothyroxine  75 mcg Oral QAC breakfast  . loratadine  10 mg Oral Daily  . metoprolol succinate  100 mg Oral Q breakfast  . multivitamin with minerals  1 tablet Oral Daily  . pantoprazole  40 mg Oral Daily  . polyethylene glycol  17 g Oral Daily  . pravastatin  20 mg Oral QHS  . predniSONE  20 mg Oral Q breakfast  . [START ON 10/11/2016] Vitamin D (Ergocalciferol)  50,000 Units Oral Q7 days   Continuous Infusions: . azithromycin Stopped (10/08/16 1215)  . cefTRIAXone (ROCEPHIN)  IV Stopped (10/08/16 1308)   PRN Meds:.acetaminophen,  guaiFENesin-dextromethorphan, ipratropium-albuterol, traMADol  Micro Results Recent Results (from the past 240 hour(s))  Urine culture     Status: Abnormal (Preliminary result)   Collection Time: 10/07/16 10:00 AM  Result Value Ref Range Status   Specimen Description URINE, CLEAN CATCH  Final   Special Requests NONE  Final   Culture (A)  Final    >=100,000 COLONIES/mL ESCHERICHIA COLI SUSCEPTIBILITIES TO FOLLOW Performed at [facility], [place] [address], [place], [place] [address]    Report Status PENDING  Incomplete  Respiratory Panel by PCR     Status: None   Collection Time: 10/07/16  4:43 PM  Result Value Ref Range Status   Adenovirus NOT DETECTED NOT DETECTED Final   Coronavirus 229E NOT DETECTED NOT DETECTED Final   Coronavirus HKU1 NOT DETECTED NOT DETECTED Final   Coronavirus NL63 NOT DETECTED NOT DETECTED Final   Coronavirus OC43 NOT DETECTED NOT DETECTED Final   Metapneumovirus NOT DETECTED NOT DETECTED Final   Rhinovirus / Enterovirus NOT DETECTED NOT  DETECTED Final   Influenza A NOT DETECTED NOT DETECTED Final   Influenza B NOT DETECTED NOT DETECTED Final   Parainfluenza Virus 1 NOT DETECTED NOT DETECTED Final   Parainfluenza Virus 2 NOT DETECTED NOT DETECTED Final   Parainfluenza Virus 3 NOT DETECTED NOT DETECTED Final   Parainfluenza Virus 4 NOT DETECTED NOT DETECTED Final   Respiratory Syncytial Virus NOT DETECTED NOT DETECTED Final   Bordetella pertussis NOT DETECTED NOT DETECTED Final   Chlamydophila pneumoniae NOT DETECTED NOT DETECTED Final   Mycoplasma pneumoniae NOT DETECTED NOT DETECTED Final    Comment: Performed at [facility], [place] [address] [place], [place] [address]  Culture, blood (routine x 2) Call MD if unable to obtain prior to antibiotics being given     Status: None (Preliminary result)   Collection Time: 10/07/16  5:18 PM  Result Value Ref Range Status   Specimen Description BLOOD LEFT ARM  Final   Special Requests IN PEDIATRIC  BOTTLE Blood Culture adequate volume  Final   Culture   Final    NO GROWTH < 24 HOURS Performed at [facility], [place] [address], [place], [place] [address]    Report Status PENDING  Incomplete  Culture, blood (routine x 2) Call MD if unable to obtain prior to antibiotics being given     Status: None (Preliminary result)   Collection Time: 10/07/16  5:18 PM  Result Value Ref Range Status   Specimen Description LEFT ANTECUBITAL  Final   Special Requests   Final    BOTTLES DRAWN AEROBIC ONLY Blood Culture adequate volume   Culture   Final    NO GROWTH < 24 HOURS Performed at [facility], [address], [place], [place] [address]    Report Status PENDING  Incomplete    Radiology Reports Ct Abdomen Pelvis Wo Contrast  Result Date: 10/07/2016 CLINICAL DATA:  Abdominal pain and vomiting EXAM: CT ABDOMEN AND PELVIS WITHOUT CONTRAST TECHNIQUE: Multidetector CT imaging of the abdomen and pelvis was performed following the standard protocol without IV contrast. COMPARISON:  09/25/2015 FINDINGS: Lower chest: Bibasilar scarring is again identified. Coronary calcifications are seen. Mild reflux is noted in the distal esophagus. Hepatobiliary: No focal liver abnormality is seen. Status post cholecystectomy. No biliary dilatation. Pancreas: Pancreas is well visualized. A fluid attenuation area is noted adjacent to the body of the pancreas which measures approximately 2.7 cm. In retrospect this is stable from the prior exam and likely represents a small pseudocyst. The pancreas is otherwise within normal limits. Spleen: Normal in size without focal abnormality. Adrenals/Urinary Tract: The adrenal glands are within normal limits. The kidneys are well visualized without renal calculi or obstructive changes. A exophytic cyst is noted arising from the posterior aspect of the mid to lower pole stable from the prior exam. Smaller left renal cyst is also noted stable from the previous study. The bladder  is partially distended. Some air is noted within the bladder which may be related to recent instrumentation. Stomach/Bowel: Considerable fecal material is noted throughout the colon. No true colonic obstructive changes are seen. In the mid jejunum however there are multiple dilated loops of small bowel identified. The ileum appears within normal limits. No true transition zone is identified. This may represent a small-bowel ileus although the possibility of adhesions would deserve consideration. Vascular/Lymphatic: Aortic atherosclerosis. No enlarged abdominal or pelvic lymph nodes. Reproductive: Status post hysterectomy. No adnexal masses. Other: No abdominal wall hernia or abnormality. No abdominopelvic  ascites. Musculoskeletal: Degenerative changes of the lumbar spine are seen. No acute bony abnormality is noted. IMPRESSION: Dilated loops of jejunum without definitive transition zone. This may represent a small-bowel ileus. Clinical correlation is recommended. Fluid attenuation lesion within the midportion of the pancreas relatively stable from the previous exam consistent with a small pseudocyst. Fecal material throughout the colon consistent with a degree of constipation. Electronically Signed   By: Inez Catalina M.D.   On: 10/07/2016 08:40   Dg Chest 1 View  Result Date: 09/12/2016 CLINICAL DATA:  Productive cough.  Fell last night. EXAM: CHEST 1 VIEW COMPARISON:  07/14/2016. FINDINGS: Borderline enlarged cardiac silhouette. Aortic calcification. Stable linear densities at the left lung base and small amount of left pleural thickening or fluid. Lower thoracic spine and bilateral shoulder degenerative changes. IMPRESSION: 1. Stable left basilar linear atelectasis or scarring and small amount of left pleural thickening or fluid. 2. Aortic atherosclerosis. Electronically Signed   By: Claudie Revering M.D.   On: 09/12/2016 14:16   Dg Chest 2 View  Result Date: 10/07/2016 CLINICAL DATA:  Cough and congestion  EXAM: CHEST  2 VIEW COMPARISON:  09/15/2016 FINDINGS: Cardiac shadow is stable. The right lung is well aerated without focal infiltrate. Patchy changes are noted in the left mid lung and lung base increased from the prior exam. No bony abnormality is noted. IMPRESSION: Patchy infiltrates in the left lung as described. Electronically Signed   By: Inez Catalina M.D.   On: 10/07/2016 10:41   Dg Pelvis 1-2 Views  Result Date: 09/12/2016 CLINICAL DATA:  Pelvic pain following a fall last night. EXAM: PELVIS - 1-2 VIEW COMPARISON:  Pelvis CT dated 09/25/2015 FINDINGS: No pelvic fracture or dislocation seen. Lower lumbar spine degenerative changes. IMPRESSION: No fracture. Electronically Signed   By: Claudie Revering M.D.   On: 09/12/2016 14:18   Ct Head Wo Contrast  Result Date: 10/07/2016 CLINICAL DATA:  76 year old female with acute encephalopathy and altered mental status. EXAM: CT HEAD WITHOUT CONTRAST TECHNIQUE: Contiguous axial images were obtained from the base of the skull through the vertex without intravenous contrast. COMPARISON:  09/12/2016 and prior CTs FINDINGS: Brain: No evidence of acute infarction, hemorrhage, hydrocephalus, extra-axial collection or mass lesion/mass effect. Chronic small-vessel white matter ischemic changes and a remote right cerebellar infarct are again noted. Vascular: Mild intracranial atherosclerotic calcifications noted. Skull: Normal. Negative for fracture or focal lesion. Sinuses/Orbits: No acute finding. Other: None. IMPRESSION: No evidence of acute intracranial abnormality. Chronic small-vessel white matter ischemic changes and remote right cerebellar infarct. Electronically Signed   By: Margarette Canada M.D.   On: 10/07/2016 14:45   Ct Head Wo Contrast  Result Date: 09/12/2016 CLINICAL DATA:  Per EMS, pt is coming from home after experiencing a fall at home last night. Pt lives with her daughter and daughter stated pt did not hit her head when fall occurred. Daughter reports  that pt had a fever and a productive cough last night and had increased weakness after the fall occurred. Pt usually ambulates independently but now requires assistance to get up out of the bed. Pt AO x4. EXAM: CT HEAD WITHOUT CONTRAST CT CERVICAL SPINE WITHOUT CONTRAST TECHNIQUE: Multidetector CT imaging of the head and cervical spine was performed following the standard protocol without intravenous contrast. Multiplanar CT image reconstructions of the cervical spine were also generated. COMPARISON:  07/14/2016 FINDINGS: CT HEAD FINDINGS Brain: No evidence of acute infarction, hemorrhage, hydrocephalus, extra-axial collection or mass lesion/mass effect. Age related volume loss.  Mild chronic microvascular ischemic change. 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 Stable appearance from the prior exam. Vascular: No hyperdense vessel or unexpected calcification. Skull: Normal. Negative for fracture or focal lesion. Sinuses/Orbits: Visualized globes and orbits are unremarkable. Visualized sinuses and mastoid air cells are clear. Other: None. CT CERVICAL SPINE FINDINGS Alignment: Mild reversal of the normal cervical lordosis, apex at C4. No spondylolisthesis. Skull base and vertebrae: No acute fracture. No primary bone lesion or focal pathologic process. Soft tissues and spinal canal: No prevertebral fluid or swelling. No visible canal hematoma. Disc levels: Mild loss of disc height from C3-C4 through C5-C6. Endplate spurring noted from C4 through C7. Facet degenerative change noted most prominent on the right at C2-C3. No disc herniations. Upper chest: No masses.  No acute findings. Other: None. IMPRESSION: HEAD CT:  No acute intracranial abnormalities.  No skull fracture. CERVICAL CT:  No fracture or acute finding. Electronically Signed   By: Lajean Manes M.D.   On: 09/12/2016 14:29   Ct Cervical Spine Wo Contrast  Result Date: 09/12/2016 CLINICAL DATA:  Per EMS, pt is coming from home after experiencing a fall  at home last night. Pt lives with her daughter and daughter stated pt did not hit her head when fall occurred. Daughter reports that pt had a fever and a productive cough last night and had increased weakness after the fall occurred. Pt usually ambulates independently but now requires assistance to get up out of the bed. Pt AO x4. EXAM: CT HEAD WITHOUT CONTRAST CT CERVICAL SPINE WITHOUT CONTRAST TECHNIQUE: Multidetector CT imaging of the head and cervical spine was performed following the standard protocol without intravenous contrast. Multiplanar CT image reconstructions of the cervical spine were also generated. COMPARISON:  07/14/2016 FINDINGS: CT HEAD FINDINGS Brain: No evidence of acute infarction, hemorrhage, hydrocephalus, extra-axial collection or mass lesion/mass effect. Age related volume loss. Mild chronic microvascular ischemic change. 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 Stable appearance from the prior exam. Vascular: No hyperdense vessel or unexpected calcification. Skull: Normal. Negative for fracture or focal lesion. Sinuses/Orbits: Visualized globes and orbits are unremarkable. Visualized sinuses and mastoid air cells are clear. Other: None. CT CERVICAL SPINE FINDINGS Alignment: Mild reversal of the normal cervical lordosis, apex at C4. No spondylolisthesis. Skull base and vertebrae: No acute fracture. No primary bone lesion or focal pathologic process. Soft tissues and spinal canal: No prevertebral fluid or swelling. No visible canal hematoma. Disc levels: Mild loss of disc height from C3-C4 through C5-C6. Endplate spurring noted from C4 through C7. Facet degenerative change noted most prominent on the right at C2-C3. No disc herniations. Upper chest: No masses.  No acute findings. Other: None. IMPRESSION: HEAD CT:  No acute intracranial abnormalities.  No skull fracture. CERVICAL CT:  No fracture or acute finding. Electronically Signed   By: Lajean Manes M.D.   On: 09/12/2016 14:29    Dg Chest Port 1 View  Result Date: 10/07/2016 CLINICAL DATA:  Altered mental status. EXAM: PORTABLE CHEST 1 VIEW COMPARISON:  10/07/2016 and prior radiographs FINDINGS: The cardiomediastinal silhouette is unremarkable. Left basilar opacity/ atelectasis again noted. Patchy left upper lung airspace disease is unchanged. The right lung is clear. There is no evidence of pneumothorax. IMPRESSION: Little significant change with left basilar opacity/atelectasis and patchy left upper lung airspace disease again noted. Electronically Signed   By: Margarette Canada M.D.   On: 10/07/2016 14:31   Dg Chest Port 1 View  Result Date: 09/15/2016 CLINICAL DATA:  Rhonchi. EXAM:  PORTABLE CHEST 1 VIEW COMPARISON:  09/13/2016 . FINDINGS: Mediastinum hilar structures normal. Cardiomegaly with normal pulmonary vascularity. No focal infiltrate. Stable pleural thickening on the left consistent with scarring. No pneumothorax . IMPRESSION: 1.  Stable cardiomegaly No pulmonary venous congestion. 2. Stable left base pleuroparenchymal thickening consistent with scarring. No focal infiltrate identified. Electronically Signed   By: Marcello Moores  Register   On: 09/15/2016 11:04   Dg Chest Port 1 View  Result Date: 09/13/2016 CLINICAL DATA:  Abnormal breathing sounds. History of COPD, CHF, diabetes. EXAM: PORTABLE CHEST 1 VIEW COMPARISON:  Chest radiograph September 12, 2016 FINDINGS: The cardiac silhouette is mildly enlarged unchanged. Calcified aortic knob. Similar patchy LEFT lung base airspace opacity, blunted costophrenic angle. RIGHT lung is clear. No pneumothorax. Soft tissue planes and included osseous structures are nonsuspicious. IMPRESSION: Stable LEFT basilar airspace opacity with small LEFT pleural effusion versus pleural thickening. Electronically Signed   By: Elon Alas M.D.   On: 09/13/2016 01:58    Time Spent in minutes  35   Louellen Molder M.D on 10/08/2016 at 1:57 PM  Between 7am to 7pm - Pager - [phone]  After  7pm go to [email or web address] - password [facility]  [facility] -  Office  [phone]

## 2016-10-08 NOTE — Care Management Note (Signed)
Case Management Note  Patient Details  Name: CHARLA CRISCIONE MRN: [id number] Date of Birth: Nov 05, 1940  Subjective/Objective:   76 y/o f admitted w/PNA. From home w/dtr. PT-recc HHPT. Provided patient w/HHC agency list-dtr chose [facility] aware of HHRN/PT/OT, following. Noted on 02,nebs-will monitor if needed @ home.                Action/Plan:d/c home w/HHC.   Expected Discharge Date:   (unknown)               Expected Discharge Plan:  [place]  In-House Referral:     Discharge planning Services  CM Consult  Post Acute Care Choice:    Choice offered to:     DME Arranged:    DME Agency:     HH Arranged:    HH Agency:     Status of Service:  In process, will continue to follow  If discussed at Long Length of Stay Meetings, dates discussed:    Additional Comments:  Dessa Phi, RN 10/08/2016, 1:03 PM

## 2016-10-09 ENCOUNTER — Inpatient Hospital Stay (HOSPITAL_COMMUNITY): Payer: Medicare Other

## 2016-10-09 DIAGNOSIS — G9340 Encephalopathy, unspecified: Secondary | ICD-10-CM

## 2016-10-09 LAB — CBC
HEMATOCRIT: 27.2 % — AB (ref 36.0–46.0)
HEMOGLOBIN: 9.3 g/dL — AB (ref 12.0–15.0)
MCH: 31 pg (ref 26.0–34.0)
MCHC: 34.2 g/dL (ref 30.0–36.0)
MCV: 90.7 fL (ref 78.0–100.0)
Platelets: 197 10*3/uL (ref 150–400)
RBC: 3 MIL/uL — ABNORMAL LOW (ref 3.87–5.11)
RDW: 13.8 % (ref 11.5–15.5)
WBC: 17.8 10*3/uL — AB (ref 4.0–10.5)

## 2016-10-09 LAB — BASIC METABOLIC PANEL
ANION GAP: 10 (ref 5–15)
BUN: 28 mg/dL — ABNORMAL HIGH (ref 6–20)
CALCIUM: 8.2 mg/dL — AB (ref 8.9–10.3)
CO2: 23 mmol/L (ref 22–32)
Chloride: 103 mmol/L (ref 101–111)
Creatinine, Ser: 1.48 mg/dL — ABNORMAL HIGH (ref 0.44–1.00)
GFR calc non Af Amer: 33 mL/min — ABNORMAL LOW (ref >60)
GFR, EST AFRICAN AMERICAN: 38 mL/min — AB (ref >60)
Glucose, Bld: 109 mg/dL — ABNORMAL HIGH (ref 65–99)
Potassium: 3.9 mmol/L (ref 3.5–5.1)
Sodium: 136 mmol/L (ref 135–145)

## 2016-10-09 LAB — GLUCOSE, CAPILLARY
GLUCOSE-CAPILLARY: 99 mg/dL (ref 65–99)
GLUCOSE-CAPILLARY: 221 mg/dL — AB (ref 65–99)
GLUCOSE-CAPILLARY: 185 mg/dL — AB (ref 65–99)
GLUCOSE-CAPILLARY: 178 mg/dL — AB (ref 65–99)

## 2016-10-09 LAB — URINE CULTURE: Culture: >=100000 — AB

## 2016-10-09 LAB — LEGIONELLA PNEUMOPHILA SEROGP 1 UR AG: L. PNEUMOPHILA SEROGP 1 UR AG: NEGATIVE

## 2016-10-09 MED ORDER — AMPICILLIN-SULBACTAM SODIUM 1.5 (1-0.5) G IJ SOLR
1.5000 g | Freq: Four times a day (QID) | INTRAMUSCULAR | Status: DC
  Administered 2016-10-09 – 2016-10-10 (×5): 1.5 g via INTRAVENOUS
  Filled 2016-10-09 (×6): qty 1.5

## 2016-10-09 MED ORDER — PANTOPRAZOLE SODIUM 40 MG PO TBEC
40.0000 mg | DELAYED_RELEASE_TABLET | Freq: Two times a day (BID) | ORAL | Status: DC
  Administered 2016-10-09 – 2016-10-10 (×2): 40 mg via ORAL
  Filled 2016-10-09 (×2): qty 1

## 2016-10-09 NOTE — Progress Notes (Signed)
Physical Therapy Treatment Patient Details Name: Rebecca [name] MRN: [id number] DOB: [birth year] Today's Date: 10/09/2016    History of Present Illness 76 yo female admitted with Pna, metabolic encephalopathy, SB ileus. Hx of + flu, MGUS, DM, peripheral neuropathy, polymyalgia rheumatica, COPD, CAD,CKD, HF    PT Comments    Progressing well with mobility. Pt tolerated session well. Updated daughter Rebecca [name]) over the phone.    Follow Up Recommendations  Supervision/Assistance - 24 hour (resume adult senior program)     Equipment Recommendations  None recommended by PT    Recommendations for Other Services       Precautions / Restrictions Precautions Precautions: Fall Restrictions Weight Bearing Restrictions: No    Mobility  Bed Mobility Overal bed mobility: Modified Independent                Transfers Overall transfer level: Needs assistance   Transfers: Sit to/from Stand;Stand Pivot Transfers Sit to Stand: Supervision Stand pivot transfers: Supervision       General transfer comment: VCs safety, safe operation of rollator.   Ambulation/Gait Ambulation/Gait assistance: Supervision Ambulation Distance (Feet): 250 Feet Assistive device: 4-wheeled walker Gait Pattern/deviations: Step-through pattern;Decreased stride length     General Gait Details: supervision for safety. cues for safety, pacing. good gait speed.    Stairs            Wheelchair Mobility    Modified Rankin (Stroke Patients Only)       Balance                                            Cognition Arousal/Alertness: Awake/alert Behavior During Therapy: WFL for tasks assessed/performed Overall Cognitive Status: Within Functional Limits for tasks assessed Area of Impairment: Memory                                      Exercises      General Comments        Pertinent Vitals/Pain Pain Assessment: 0-10 Pain Score: 4  Pain Location: bil  anterior pelvic areas Pain Descriptors / Indicators: Sore Pain Intervention(s): Monitored during session    Home Living                      Prior Function            PT Goals (current goals can now be found in the care plan section) Progress towards PT goals: Progressing toward goals    Frequency    Min 3X/week      PT Plan Current plan remains appropriate    Co-evaluation             End of Session   Activity Tolerance: Patient tolerated treatment well Patient left: in bed;with call bell/phone within reach;with bed alarm set   PT Visit Diagnosis: Muscle weakness (generalized) (M62.81);Difficulty in walking, not elsewhere classified (R26.2)     Time: 0076-2263 PT Time Calculation (min) (ACUTE ONLY): 13 min  Charges:  $Gait Training: 8-22 mins                    G Codes:          Rebecca [name], MPT Pager: [phone]

## 2016-10-09 NOTE — Progress Notes (Signed)
[facility] PROGRESS NOTE    Progress Note  Rebecca [name]  EUM:[id number] DOB: 06-Feb-1941 DOA: 10/07/2016 PCP: [name], MD     Brief Narrative:   Rebecca [name] is an 76 y.o. female past medical history of MGUS, mild dementia essential hypertension PMR on low-dose prednisone recently hospitalized to influenza B and left-sided pneumonia presents with ongoing nausea and vomiting comes in for lower abdominal pain 1 day prior to admission, patient was found to be encephalopathic in the ED visit with a white count 25,000 and patchy left lower lobe infiltrates.  Assessment/Plan:   Lobar pneumonia, unspecified organism [facility]): Started empirically on IV Rocephin and azithromycin, respiratory panel pending. Influenza is negative. CT of the chest shows mild degree in the main bronchus concerning for aspiration. Continue Tylenol for fever leukocytosis is improving slowly, has remained afebrile. Swallowing evaluation showed moderate risk, MB BS pending.  UTI: An culture growing Escherichia coli sensitive to Augmentin. Last hospitalization her urine was positive for Escherichia coli question colonization as she is currently asymptomatic.   Acute metabolic encephalopathy: Now resolved, likely due to infectious etiologies plus minus dehydration.  Acute kidney injury on   CKD stage 4, GFR 15-29 ml/min : Likely prerenal in etiology improving with IV fluid hydration   Anemia in chronic renal disease Stable since we have baseline.  Leukocytosis Likely due to infectious etiology improving slowly.  Coronary artery disease involving native coronary artery of native heart without angina pectoris Continue aspirin and Plavix and statins. She currently asymptomatic.  Benign essential HTN    DVT prophylaxis: lovenox Family Communication:none Disposition Plan/Barrier to D/C: home in 1 day Code Status:     Code Status Orders        Start     Ordered   10/07/16 1236  Full code   Continuous     10/07/16 1235    Code Status History    Date Active Date Inactive Code Status Order ID Comments User Context   09/12/2016  3:10 PM 09/16/2016  8:16 PM Full Code [id number]  Ava Swayze, DO ED   09/12/2016  3:10 PM 09/12/2016  3:10 PM Full Code [id number]  Ava Swayze, DO ED   07/14/2016  8:48 PM 07/19/2016  7:40 PM Full Code [id number]  Robbie Lis, MD ED   08/11/2015  2:26 AM 08/14/2015  7:56 PM Full Code [id number]  Rise Patience, MD Inpatient   08/11/2015  1:26 AM 08/11/2015  2:26 AM Full Code [id number]  Rise Patience, MD Inpatient   08/11/2015  1:04 AM 08/11/2015  1:26 AM DNR [id number]  Rise Patience, MD Inpatient   10/16/2013  8:14 PM 10/18/2013  8:21 PM Full Code [id number]  Shanda Howells, MD ED   07/14/2012  9:04 AM 07/15/2012  3:17 PM Full Code [id number]  Sharon Mt Street, MD Inpatient        IV Access:    Peripheral IV   Procedures and diagnostic studies:   Ct Abdomen Pelvis Wo Contrast  Result Date: 10/07/2016 CLINICAL DATA:  Abdominal pain and vomiting EXAM: CT ABDOMEN AND PELVIS WITHOUT CONTRAST TECHNIQUE: Multidetector CT imaging of the abdomen and pelvis was performed following the standard protocol without IV contrast. COMPARISON:  09/25/2015 FINDINGS: Lower chest: Bibasilar scarring is again identified. Coronary calcifications are seen. Mild reflux is noted in the distal esophagus. Hepatobiliary: No focal liver abnormality is seen. Status post cholecystectomy. No biliary dilatation. Pancreas: Pancreas is well visualized. A fluid attenuation area is noted adjacent to the  body of the pancreas which measures approximately 2.7 cm. In retrospect this is stable from the prior exam and likely represents a small pseudocyst. The pancreas is otherwise within normal limits. Spleen: Normal in size without focal abnormality. Adrenals/Urinary Tract: The adrenal glands are within normal limits. The kidneys are well visualized without renal calculi or obstructive changes. A  exophytic cyst is noted arising from the posterior aspect of the mid to lower pole stable from the prior exam. Smaller left renal cyst is also noted stable from the previous study. The bladder is partially distended. Some air is noted within the bladder which may be related to recent instrumentation. Stomach/Bowel: Considerable fecal material is noted throughout the colon. No true colonic obstructive changes are seen. In the mid jejunum however there are multiple dilated loops of small bowel identified. The ileum appears within normal limits. No true transition zone is identified. This may represent a small-bowel ileus although the possibility of adhesions would deserve consideration. Vascular/Lymphatic: Aortic atherosclerosis. No enlarged abdominal or pelvic lymph nodes. Reproductive: Status post hysterectomy. No adnexal masses. Other: No abdominal wall hernia or abnormality. No abdominopelvic ascites. Musculoskeletal: Degenerative changes of the lumbar spine are seen. No acute bony abnormality is noted. IMPRESSION: Dilated loops of jejunum without definitive transition zone. This may represent a small-bowel ileus. Clinical correlation is recommended. Fluid attenuation lesion within the midportion of the pancreas relatively stable from the previous exam consistent with a small pseudocyst. Fecal material throughout the colon consistent with a degree of constipation. Electronically Signed   By: Inez Catalina M.D.   On: 10/07/2016 08:40   Dg Chest 2 View  Result Date: 10/07/2016 CLINICAL DATA:  Cough and congestion EXAM: CHEST  2 VIEW COMPARISON:  09/15/2016 FINDINGS: Cardiac shadow is stable. The right lung is well aerated without focal infiltrate. Patchy changes are noted in the left mid lung and lung base increased from the prior exam. No bony abnormality is noted. IMPRESSION: Patchy infiltrates in the left lung as described. Electronically Signed   By: Inez Catalina M.D.   On: 10/07/2016 10:41   Ct Head Wo  Contrast  Result Date: 10/07/2016 CLINICAL DATA:  76 year old female with acute encephalopathy and altered mental status. EXAM: CT HEAD WITHOUT CONTRAST TECHNIQUE: Contiguous axial images were obtained from the base of the skull through the vertex without intravenous contrast. COMPARISON:  09/12/2016 and prior CTs FINDINGS: Brain: No evidence of acute infarction, hemorrhage, hydrocephalus, extra-axial collection or mass lesion/mass effect. Chronic small-vessel white matter ischemic changes and a remote right cerebellar infarct are again noted. Vascular: Mild intracranial atherosclerotic calcifications noted. Skull: Normal. Negative for fracture or focal lesion. Sinuses/Orbits: No acute finding. Other: None. IMPRESSION: No evidence of acute intracranial abnormality. Chronic small-vessel white matter ischemic changes and remote right cerebellar infarct. Electronically Signed   By: Margarette Canada M.D.   On: 10/07/2016 14:45   Ct Chest Wo Contrast  Result Date: 10/08/2016 CLINICAL DATA:  Recurrent lobar pneumonia.  Evaluate for mass. EXAM: CT CHEST WITHOUT CONTRAST TECHNIQUE: Multidetector CT imaging of the chest was performed following the standard protocol without IV contrast. COMPARISON:  None. FINDINGS: Cardiovascular: Normal heart size. No pericardial effusion. Diffuse atherosclerosis of the aorta and coronaries. Mediastinum/Nodes: Negative for adenopathy or mass. Lungs/Pleura: There is patchy ground-glass nodularity in the right lower lobe, clustered. Reticular densities in the left lower lobe with volume loss and mucoid impaction. Secretions or debris is seen in the bronchus intermedius. In left upper lobe is a lobulated nodule measuring 25 by  12 mm on axial slices, with few smaller adjacent nodules. This cluster of nodularity favors an infectious process, but neoplasm with satellite nodules is not excluded. Upper Abdomen: Pancreatic cystic density mass as described on recent abdominal CT. Short segment of  intimal calcium displacement involving the aorta at the hiatus, chronic since at least 09/25/2015, complex plaque versus focal dissection. No associated aneurysm. Musculoskeletal: No acute or aggressive finding. IMPRESSION: 1. No obstructing lesion to explain recurrent pneumonia. There is endobronchial debris and airspace opacities in the lower lobes, would correlate for aspiration. 2. 24 x 14 mm nodule in the left upper lobe. This is 1 of multiple clustered nodules, favoring an infectious process, but CT in 6-8 weeks is recommended to ensure resolution -neoplasm with satellite nodules could also have this appearance. 3. Aortic Atherosclerosis (ICD10-I70.0) that is extensive. 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 Electronically Signed   By: Monte Fantasia M.D.   On: 10/08/2016 16:29   Dg Chest Port 1 View  Result Date: 10/07/2016 CLINICAL DATA:  Altered mental status. EXAM: PORTABLE CHEST 1 VIEW COMPARISON:  10/07/2016 and prior radiographs FINDINGS: The cardiomediastinal silhouette is unremarkable. Left basilar opacity/ atelectasis again noted. Patchy left upper lung airspace disease is unchanged. The right lung is clear. There is no evidence of pneumothorax. IMPRESSION: Little significant change with left basilar opacity/atelectasis and patchy left upper lung airspace disease again noted. Electronically Signed   By: Margarette Canada M.D.   On: 10/07/2016 14:31     Medical Consultants:    None.  Anti-Infectives:   None  Subjective:    Rebecca [name] she has no new complaints. Her daughter had not coughing after eating. As per patient..  Objective:    Vitals:   10/08/16 1958 10/08/16 2115 10/09/16 0514 10/09/16 0800  BP:  140/64 (!) 142/58   Pulse:  81 74   Resp:  18 18   Temp:  98.7 F (37.1 C) 98.3 F (36.8 C)   TempSrc:  Oral Oral   SpO2: 97% 97% 96% 94%  Weight:      Height:        Intake/Output Summary (Last 24 hours) at 10/09/16 0807 Last data filed at  10/08/16 1440  Gross per 24 hour  Intake              300 ml  Output              155 ml  Net              145 ml   Filed Weights   10/07/16 1600  Weight: 65.5 kg (144 lb 6.4 oz)    Exam: General exam: In no acute distress. Respiratory system: Good air movement clear to auscultation. Cardiovascular system: Regular rate and rhythm with positive S1-S2 Gastrointestinal system: Positive bowel sounds soft nondistended nontender.  Central nervous system: Nonfocal Extremities: No edema Skin: No rashes or ulcerations Psychiatry: Insight appears normal.   Data Reviewed:    Labs: Basic Metabolic Panel:  Recent Labs Lab 10/07/16 0558 10/08/16 0616 10/09/16 0602  NA 141 143 136  K 3.8 4.1 3.9  CL 100* 107 103  CO2 28 25 23   GLUCOSE 122* 87 109*  BUN 46* 40* 28*  CREATININE 2.14* 1.96* 1.48*  CALCIUM 9.7 8.7* 8.2*   GFR Estimated Creatinine Clearance: 28.3 mL/min (A) (by C-G formula based on SCr of 1.48 mg/dL (H)). Liver Function Tests:  Recent Labs Lab 10/07/16 0558  AST 22  ALT  16  ALKPHOS 47  BILITOT 0.4  PROT 7.1  ALBUMIN 3.6    Recent Labs Lab 10/07/16 0558  LIPASE 21   No results for input(s): AMMONIA in the last 168 hours. Coagulation profile No results for input(s): INR, PROTIME in the last 168 hours.  CBC:  Recent Labs Lab 10/07/16 0558 10/08/16 0616 10/09/16 0602  WBC 25.0* 18.3* 17.8*  NEUTROABS 19.9*  --   --   HGB 11.6* 10.4* 9.3*  HCT 35.1* 32.4* 27.2*  MCV 91.6 94.5 90.7  PLT 255 202 197   Cardiac Enzymes: No results for input(s): CKTOTAL, [name], [name], TROPONINI in the last 168 hours. BNP (last 3 results) No results for input(s): PROBNP in the last 8760 hours. CBG:  Recent Labs Lab 10/08/16 0804 10/08/16 1139 10/08/16 1712 10/08/16 2042 10/09/16 0702  GLUCAP 80 261* 210* 148* 99   D-Dimer: No results for input(s): DDIMER in the last 72 hours. Hgb A1c: No results for input(s): HGBA1C in the last 72 hours. Lipid  Profile: No results for input(s): CHOL, [name], [name], [name], [name], LDLDIRECT in the last 72 hours. Thyroid function studies: No results for input(s): TSH, T4TOTAL, T3FREE, THYROIDAB in the last 72 hours.  Invalid input(s): FREET3 Anemia work up: No results for input(s): VITAMINB12, FOLATE, FERRITIN, TIBC, IRON, RETICCTPCT in the last 72 hours. Sepsis Labs:  Recent Labs Lab 10/07/16 0558 10/08/16 0616 10/09/16 0602  WBC 25.0* 18.3* 17.8*   Microbiology Recent Results (from the past 240 hour(s))  Urine culture     Status: Abnormal   Collection Time: 10/07/16 10:00 AM  Result Value Ref Range Status   Specimen Description URINE, CLEAN CATCH  Final   Special Requests NONE  Final   Culture >=100,000 COLONIES/mL ESCHERICHIA COLI (A)  Final   Report Status 10/09/2016 FINAL  Final   Organism ID, Bacteria ESCHERICHIA COLI (A)  Final      Susceptibility   Escherichia coli - MIC*    AMPICILLIN >=32 RESISTANT Resistant     CEFAZOLIN <=4 SENSITIVE Sensitive     CEFTRIAXONE <=1 SENSITIVE Sensitive     CIPROFLOXACIN >=4 RESISTANT Resistant     GENTAMICIN >=16 RESISTANT Resistant     IMIPENEM <=0.25 SENSITIVE Sensitive     NITROFURANTOIN <=16 SENSITIVE Sensitive     TRIMETH/SULFA >=320 RESISTANT Resistant     AMPICILLIN/SULBACTAM 4 SENSITIVE Sensitive     PIP/TAZO <=4 SENSITIVE Sensitive     Extended ESBL NEGATIVE Sensitive     * >=100,000 COLONIES/mL ESCHERICHIA COLI  Respiratory Panel by PCR     Status: None   Collection Time: 10/07/16  4:43 PM  Result Value Ref Range Status   Adenovirus NOT DETECTED NOT DETECTED Final   Coronavirus 229E NOT DETECTED NOT DETECTED Final   Coronavirus HKU1 NOT DETECTED NOT DETECTED Final   Coronavirus NL63 NOT DETECTED NOT DETECTED Final   Coronavirus OC43 NOT DETECTED NOT DETECTED Final   Metapneumovirus NOT DETECTED NOT DETECTED Final   Rhinovirus / Enterovirus NOT DETECTED NOT DETECTED Final   Influenza A NOT DETECTED NOT DETECTED Final    Influenza B NOT DETECTED NOT DETECTED Final   Parainfluenza Virus 1 NOT DETECTED NOT DETECTED Final   Parainfluenza Virus 2 NOT DETECTED NOT DETECTED Final   Parainfluenza Virus 3 NOT DETECTED NOT DETECTED Final   Parainfluenza Virus 4 NOT DETECTED NOT DETECTED Final   Respiratory Syncytial Virus NOT DETECTED NOT DETECTED Final   Bordetella pertussis NOT DETECTED NOT DETECTED Final   Chlamydophila pneumoniae NOT  DETECTED NOT DETECTED Final   Mycoplasma pneumoniae NOT DETECTED NOT DETECTED Final    Comment: Performed at [facility], [place] [address], [place], [place] [address]  Culture, blood (routine x 2) Call MD if unable to obtain prior to antibiotics being given     Status: None (Preliminary result)   Collection Time: 10/07/16  5:18 PM  Result Value Ref Range Status   Specimen Description BLOOD LEFT ARM  Final   Special Requests IN PEDIATRIC BOTTLE Blood Culture adequate volume  Final   Culture   Final    NO GROWTH < 24 HOURS Performed at [facility], [place] [address], [place], [place] [address]    Report Status PENDING  Incomplete  Culture, blood (routine x 2) Call MD if unable to obtain prior to antibiotics being given     Status: None (Preliminary result)   Collection Time: 10/07/16  5:18 PM  Result Value Ref Range Status   Specimen Description LEFT ANTECUBITAL  Final   Special Requests   Final    BOTTLES DRAWN AEROBIC ONLY Blood Culture adequate volume   Culture   Final    NO GROWTH < 24 HOURS Performed at [facility], [address], [place], [place] [address]    Report Status PENDING  Incomplete     Medications:   . aspirin EC  81 mg Oral Daily  . clopidogrel  75 mg Oral Q breakfast  . clozapine  300 mg Oral QHS  . diltiazem  180 mg Oral Q breakfast  . docusate sodium  200 mg Oral QHS  . HYDROcodone-homatropine  5 mL Oral TID  . insulin aspart  0-9 Units Subcutaneous TID WC  . ipratropium-albuterol  3 mL Nebulization TID  . isosorbide mononitrate   60 mg Oral Daily  . levothyroxine  75 mcg Oral QAC breakfast  . loratadine  10 mg Oral Daily  . metoprolol succinate  100 mg Oral Q breakfast  . multivitamin with minerals  1 tablet Oral Daily  . pantoprazole  40 mg Oral Daily  . polyethylene glycol  17 g Oral Daily  . pravastatin  20 mg Oral QHS  . predniSONE  20 mg Oral Q breakfast  . [START ON 10/11/2016] Vitamin D (Ergocalciferol)  50,000 Units Oral Q7 days   Continuous Infusions: . azithromycin Stopped (10/08/16 1215)  . cefTRIAXone (ROCEPHIN)  IV Stopped (10/08/16 1308)    Time spent: 25 min   LOS: 2 days   Charlynne Cousins  [facility] Pager [phone]  *Please refer to [email or web address], password [email or web address] to get updated schedule on who will round on this patient, as hospitalists switch teams weekly. If 7PM-7AM, please contact night-coverage at [email or web address], password [email or web address] for any overnight needs.  10/09/2016, 8:07 AM

## 2016-10-09 NOTE — Progress Notes (Signed)
Modified Barium Swallow Progress Note  Patient Details  Name: Rebecca [name] MRN: [id number] Date of Birth: 03-08-1941  Today's Date: 10/09/2016  Modified Barium Swallow completed.  Full report located under Chart Review in the Imaging Section.  Brief recommendations include the following:  Clinical Impression  Pt with mild oropharyngeal dysphagia and suspected primary esophageal deficits.   Oral manipulation was timely but decreased propulsion noted resulting in mild vallecular/epiglottic residuals without sensation.   Due to ill timing of laryngeal closure - pt with trace laryngeal penetration of thin liquids.  Trace laryngeal penetration of thin observed to vocal folds SILENTLY with pt taking barium tablet.  Cleared with cued throat clear.    Appearance of delayed clearance from proximal to mid esophagus (could not view lower due to pt's small stature); radiologist not present to confirm findings and MBS is not designed to examine esophagus.  Recommend consider dedicated esophageal evaluation as suspect this is pt's primary aspiration risk.  Using teach back, educated pt to findings/recommendation- including to consume softer foods, eat small frequent meals, follow solids with liquids. Rebecca [name] Evaluation Recommendations   Recommended Consults: Consider esophageal assessment   SLP Diet Recommendations: Dysphagia 3 (Mech soft) solids;Thin liquid   Liquid Administration via: Cup;Straw   Medication Administration: Whole meds with liquid   Supervision: Patient able to self feed   Compensations: Small sips/bites;Slow rate;Follow solids with liquid   Postural Changes: Remain semi-upright after after feeds/meals (Comment);Seated upright at 90 degrees   Oral Care Recommendations: Oral care BID        Rebecca [name], [place] [facility] SLP [phone]

## 2016-10-10 DIAGNOSIS — J690 Pneumonitis due to inhalation of food and vomit: Principal | ICD-10-CM

## 2016-10-10 LAB — GLUCOSE, CAPILLARY
GLUCOSE-CAPILLARY: 99 mg/dL (ref 65–99)
Glucose-Capillary: 163 mg/dL — ABNORMAL HIGH (ref 65–99)

## 2016-10-10 MED ORDER — PREDNISONE 10 MG PO TABS: 10.0000 mg | ORAL_TABLET | Freq: Every day | ORAL | 0 refills | Status: AC

## 2016-10-10 MED ORDER — AMOXICILLIN-POT CLAVULANATE 875-125 MG PO TABS
1.0000 | ORAL_TABLET | Freq: Two times a day (BID) | ORAL | Status: DC
  Administered 2016-10-10: 1 via ORAL
  Filled 2016-10-10: qty 1

## 2016-10-10 MED ORDER — AMOXICILLIN-POT CLAVULANATE 875-125 MG PO TABS: 1.0000 | ORAL_TABLET | Freq: Two times a day (BID) | ORAL | 0 refills | Status: DC

## 2016-10-10 MED ORDER — PREDNISONE 5 MG PO TABS: 10.0000 mg | ORAL_TABLET | Freq: Every day | ORAL | Status: DC

## 2016-10-10 MED ORDER — IPRATROPIUM-ALBUTEROL 0.5-2.5 (3) MG/3ML IN SOLN: 3.0000 mL | Freq: Two times a day (BID) | RESPIRATORY_TRACT | Status: DC

## 2016-10-10 NOTE — Discharge Summary (Addendum)
Physician Discharge Summary  Rebecca [name]:[id number] DOB: August 30, 1940 DOA: 10/07/2016  PCP: [name], MD  Admit date: 10/07/2016 Discharge date: 10/10/2016  Admitted From: Home Disposition:  Home  Recommendations for Outpatient Follow-up:  1. Follow up with PCP in 1-2 weeks 2. Titrate prednisone as an outpatient as tolerated.  Home Health:NO Equipment/Devices:none  Discharge Condition:stable CODE STATUS:full Diet recommendation: Dysphagia 3  Brief/Interim Summary: 76 y.o. female past medical history of MGUS, mild dementia essential hypertension PMR on low-dose prednisone recently hospitalized to influenza B and left-sided pneumonia presents with ongoing nausea and vomiting comes in for lower abdominal pain 1 day prior to admission, patient was found to be encephalopathic in the ED visit with a white count 25,000 and patchy left lower lobe infiltrates.  Discharge Diagnoses:  Active Problems:   Anemia in chronic renal disease   Leukocytosis   CKD stage 4, GFR 15-29 ml/min    Coronary artery disease involving native coronary artery of native heart without angina pectoris   Benign essential HTN   Pneumonia   Ileus (HCC)   Aspiration pneumonia of both lower lobes due to gastric secretions (HCC)  Right lower lobe aspiration pneumonia, unspecified organism [facility]): Started empirically on IV Rocephin and azithromycin. Influenza is negative. CT of the chest shows mild degree in the main bronchus concerning for aspiration. She defervesced and her leukocytosis resolved Swallowing evaluation showing moderate risk for aspiration and she was placed on a dysphagia 3 diet. She will continue Augmentin as an outpatient for 5 additional days.  UTI: An culture growing Escherichia coli sensitive to Augmentin. Augmentin should cover, she will continue as an outpatient.   Acute metabolic encephalopathy: Likely due to infectious etiology resolved.  Acute kidney injury on   CKD stage 4,  GFR 15-29 ml/min : Pre-renal etiology resolved with IV fluid hydration.   Anemia in chronic renal disease Stable follow-up with PCP as an outpatient.  Leukocytosis Acute infectious etiology  Coronary artery disease involving native coronary artery of native heart without angina pectoris Continue aspirin and Plavix and statins. She currently asymptomatic.  Discharge Instructions  Discharge Instructions    Diet - low sodium heart healthy    Complete by:  As directed    Increase activity slowly    Complete by:  As directed      Allergies as of 10/10/2016      Reactions   Levaquin [levofloxacin] Other (See Comments)   Ruptured tendon   Tape Other (See Comments)   PATIENT'S SKIN IS VERY, VERY THIN AND WILL TEAR AND BRUISE EASILY; PLEASE USE AN ALTERNATIVE (SOMETHING OTHER THAN TAPE)      Medication List    TAKE these medications   acetaminophen 500 MG tablet Commonly known as:  TYLENOL Take 1,000 mg by mouth every 6 (six) hours as needed (pain).   amoxicillin-clavulanate 875-125 MG tablet Commonly known as:  AUGMENTIN Take 1 tablet by mouth every 12 (twelve) hours.   aspirin 81 MG EC tablet Take 1 tablet (81 mg total) by mouth daily.   cetirizine 10 MG tablet Commonly known as:  ZYRTEC Take 10 mg by mouth at bedtime.   clopidogrel 75 MG tablet Commonly known as:  PLAVIX Take 1 tablet (75 mg total) by mouth daily with breakfast.   clozapine 200 MG tablet Commonly known as:  CLOZARIL Take 300 mg by mouth at bedtime. ]   COLACE 100 MG capsule Generic drug:  docusate sodium Take 200 mg by mouth at bedtime.   Cranberry 250  MG Tabs Take 1 tablet by mouth at bedtime.   denosumab 60 MG/ML Soln injection Commonly known as:  PROLIA Inject 60 mg into the skin every 6 (six) months. Administer in upper arm, thigh, or abdomen   dextromethorphan-guaiFENesin 10-100 MG/5ML liquid Commonly known as:  ROBITUSSIN-DM Take 10 mLs by mouth every 6 (six) hours as needed for  cough.   diltiazem 180 MG 24 hr tablet Commonly known as:  CARDIZEM LA Take 180 mg by mouth daily with breakfast.   Fluticasone-Salmeterol 250-50 MCG/DOSE Aepb Commonly known as:  ADVAIR Inhale 1 puff into the lungs at bedtime.   Gerhardt's butt cream Crea Apply 1 application topically 3 (three) times daily. Apply to sacral region every shift for stage 2 pressure injury.   HYDROcodone-homatropine 5-1.5 MG/5ML syrup Commonly known as:  HYCODAN Take 5 mLs by mouth 3 (three) times daily.   isosorbide mononitrate 60 MG 24 hr tablet Commonly known as:  IMDUR Take 1 tablet (60 mg total) by mouth daily.   levothyroxine 75 MCG tablet Commonly known as:  SYNTHROID, LEVOTHROID Take 75 mcg by mouth daily before breakfast.   metoprolol succinate 100 MG 24 hr tablet Commonly known as:  TOPROL-XL Take 100 mg by mouth daily with breakfast. Take with or immediately following a meal.   multivitamin with minerals tablet Take 1 tablet by mouth daily.   nitroGLYCERIN 0.4 MG SL tablet Commonly known as:  NITROSTAT Place 1 tablet (0.4 mg total) under the tongue every 5 (five) minutes as needed. For chest pain   pantoprazole 40 MG tablet Commonly known as:  PROTONIX Take 1 tablet (40 mg total) by mouth daily.   polyethylene glycol powder powder Commonly known as:  GLYCOLAX/MIRALAX Take 17 g by mouth daily. MIX AND DRINK   pravastatin 20 MG tablet Commonly known as:  PRAVACHOL Take 20 mg by mouth at bedtime.   predniSONE 10 MG tablet Commonly known as:  DELTASONE Take 1 tablet (10 mg total) by mouth daily with breakfast. Start taking on:  10/11/2016 What changed:  medication strength  how much to take   PROAIR HFA 108 (90 Base) MCG/ACT inhaler Generic drug:  albuterol Inhale 2 puffs into the lungs every 6 (six) hours as needed for wheezing or shortness of breath.   SPIRIVA RESPIMAT 2.5 MCG/ACT Aers Generic drug:  Tiotropium Bromide Monohydrate Inhale 2 puffs into the lungs  every morning.   traMADol 50 MG tablet Commonly known as:  ULTRAM Take 1 tablet (50 mg total) by mouth 2 (two) times daily as needed (for breakthrough pain). What changed:  Another medication with the same name was removed. Continue taking this medication, and follow the directions you see here.   Vitamin D (Ergocalciferol) 50000 units Caps capsule Commonly known as:  DRISDOL Take 1 capsule (50,000 Units total) by mouth every 7 (seven) days.      Follow-up Information    [facility] Follow up.   Why:  HH nursing,physical/occupational therapy Contact information: [place] [address] [phone]          Allergies  Allergen Reactions  . Levaquin [Levofloxacin] Other (See Comments)    Ruptured tendon  . Tape Other (See Comments)    PATIENT'S SKIN IS VERY, VERY THIN AND WILL TEAR AND BRUISE EASILY; PLEASE USE AN ALTERNATIVE (SOMETHING OTHER THAN TAPE)    Consultations:  None   Procedures/Studies: Ct Abdomen Pelvis Wo Contrast  Result Date: 10/07/2016 CLINICAL DATA:  Abdominal pain and vomiting EXAM:  CT ABDOMEN AND PELVIS WITHOUT CONTRAST TECHNIQUE: Multidetector CT imaging of the abdomen and pelvis was performed following the standard protocol without IV contrast. COMPARISON:  09/25/2015 FINDINGS: Lower chest: Bibasilar scarring is again identified. Coronary calcifications are seen. Mild reflux is noted in the distal esophagus. Hepatobiliary: No focal liver abnormality is seen. Status post cholecystectomy. No biliary dilatation. Pancreas: Pancreas is well visualized. A fluid attenuation area is noted adjacent to the body of the pancreas which measures approximately 2.7 cm. In retrospect this is stable from the prior exam and likely represents a small pseudocyst. The pancreas is otherwise within normal limits. Spleen: Normal in size without focal abnormality. Adrenals/Urinary Tract: The adrenal glands are within normal limits. The kidneys  are well visualized without renal calculi or obstructive changes. A exophytic cyst is noted arising from the posterior aspect of the mid to lower pole stable from the prior exam. Smaller left renal cyst is also noted stable from the previous study. The bladder is partially distended. Some air is noted within the bladder which may be related to recent instrumentation. Stomach/Bowel: Considerable fecal material is noted throughout the colon. No true colonic obstructive changes are seen. In the mid jejunum however there are multiple dilated loops of small bowel identified. The ileum appears within normal limits. No true transition zone is identified. This may represent a small-bowel ileus although the possibility of adhesions would deserve consideration. Vascular/Lymphatic: Aortic atherosclerosis. No enlarged abdominal or pelvic lymph nodes. Reproductive: Status post hysterectomy. No adnexal masses. Other: No abdominal wall hernia or abnormality. No abdominopelvic ascites. Musculoskeletal: Degenerative changes of the lumbar spine are seen. No acute bony abnormality is noted. IMPRESSION: Dilated loops of jejunum without definitive transition zone. This may represent a small-bowel ileus. Clinical correlation is recommended. Fluid attenuation lesion within the midportion of the pancreas relatively stable from the previous exam consistent with a small pseudocyst. Fecal material throughout the colon consistent with a degree of constipation. Electronically Signed   By: Inez Catalina M.D.   On: 10/07/2016 08:40   Dg Chest 1 View  Result Date: 09/12/2016 CLINICAL DATA:  Productive cough.  Fell last night. EXAM: CHEST 1 VIEW COMPARISON:  07/14/2016. FINDINGS: Borderline enlarged cardiac silhouette. Aortic calcification. Stable linear densities at the left lung base and small amount of left pleural thickening or fluid. Lower thoracic spine and bilateral shoulder degenerative changes. IMPRESSION: 1. Stable left basilar linear  atelectasis or scarring and small amount of left pleural thickening or fluid. 2. Aortic atherosclerosis. Electronically Signed   By: Claudie Revering M.D.   On: 09/12/2016 14:16   Dg Chest 2 View  Result Date: 10/07/2016 CLINICAL DATA:  Cough and congestion EXAM: CHEST  2 VIEW COMPARISON:  09/15/2016 FINDINGS: Cardiac shadow is stable. The right lung is well aerated without focal infiltrate. Patchy changes are noted in the left mid lung and lung base increased from the prior exam. No bony abnormality is noted. IMPRESSION: Patchy infiltrates in the left lung as described. Electronically Signed   By: Inez Catalina M.D.   On: 10/07/2016 10:41   Dg Pelvis 1-2 Views  Result Date: 09/12/2016 CLINICAL DATA:  Pelvic pain following a fall last night. EXAM: PELVIS - 1-2 VIEW COMPARISON:  Pelvis CT dated 09/25/2015 FINDINGS: No pelvic fracture or dislocation seen. Lower lumbar spine degenerative changes. IMPRESSION: No fracture. Electronically Signed   By: Claudie Revering M.D.   On: 09/12/2016 14:18   Ct Head Wo Contrast  Result Date: 10/07/2016 CLINICAL DATA:  76 year old female with  acute encephalopathy and altered mental status. EXAM: CT HEAD WITHOUT CONTRAST TECHNIQUE: Contiguous axial images were obtained from the base of the skull through the vertex without intravenous contrast. COMPARISON:  09/12/2016 and prior CTs FINDINGS: Brain: No evidence of acute infarction, hemorrhage, hydrocephalus, extra-axial collection or mass lesion/mass effect. Chronic small-vessel white matter ischemic changes and a remote right cerebellar infarct are again noted. Vascular: Mild intracranial atherosclerotic calcifications noted. Skull: Normal. Negative for fracture or focal lesion. Sinuses/Orbits: No acute finding. Other: None. IMPRESSION: No evidence of acute intracranial abnormality. Chronic small-vessel white matter ischemic changes and remote right cerebellar infarct. Electronically Signed   By: Margarette Canada M.D.   On: 10/07/2016  14:45   Ct Head Wo Contrast  Result Date: 09/12/2016 CLINICAL DATA:  Per EMS, pt is coming from home after experiencing a fall at home last night. Pt lives with her daughter and daughter stated pt did not hit her head when fall occurred. Daughter reports that pt had a fever and a productive cough last night and had increased weakness after the fall occurred. Pt usually ambulates independently but now requires assistance to get up out of the bed. Pt AO x4. EXAM: CT HEAD WITHOUT CONTRAST CT CERVICAL SPINE WITHOUT CONTRAST TECHNIQUE: Multidetector CT imaging of the head and cervical spine was performed following the standard protocol without intravenous contrast. Multiplanar CT image reconstructions of the cervical spine were also generated. COMPARISON:  07/14/2016 FINDINGS: CT HEAD FINDINGS Brain: No evidence of acute infarction, hemorrhage, hydrocephalus, extra-axial collection or mass lesion/mass effect. Age related volume loss. Mild chronic microvascular ischemic change. 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 Stable appearance from the prior exam. Vascular: No hyperdense vessel or unexpected calcification. Skull: Normal. Negative for fracture or focal lesion. Sinuses/Orbits: Visualized globes and orbits are unremarkable. Visualized sinuses and mastoid air cells are clear. Other: None. CT CERVICAL SPINE FINDINGS Alignment: Mild reversal of the normal cervical lordosis, apex at C4. No spondylolisthesis. Skull base and vertebrae: No acute fracture. No primary bone lesion or focal pathologic process. Soft tissues and spinal canal: No prevertebral fluid or swelling. No visible canal hematoma. Disc levels: Mild loss of disc height from C3-C4 through C5-C6. Endplate spurring noted from C4 through C7. Facet degenerative change noted most prominent on the right at C2-C3. No disc herniations. Upper chest: No masses.  No acute findings. Other: None. IMPRESSION: HEAD CT:  No acute intracranial abnormalities.  No skull  fracture. CERVICAL CT:  No fracture or acute finding. Electronically Signed   By: Lajean Manes M.D.   On: 09/12/2016 14:29   Ct Chest Wo Contrast  Result Date: 10/08/2016 CLINICAL DATA:  Recurrent lobar pneumonia.  Evaluate for mass. EXAM: CT CHEST WITHOUT CONTRAST TECHNIQUE: Multidetector CT imaging of the chest was performed following the standard protocol without IV contrast. COMPARISON:  None. FINDINGS: Cardiovascular: Normal heart size. No pericardial effusion. Diffuse atherosclerosis of the aorta and coronaries. Mediastinum/Nodes: Negative for adenopathy or mass. Lungs/Pleura: There is patchy ground-glass nodularity in the right lower lobe, clustered. Reticular densities in the left lower lobe with volume loss and mucoid impaction. Secretions or debris is seen in the bronchus intermedius. In left upper lobe is a lobulated nodule measuring 25 by 12 mm on axial slices, with few smaller adjacent nodules. This cluster of nodularity favors an infectious process, but neoplasm with satellite nodules is not excluded. Upper Abdomen: Pancreatic cystic density mass as described on recent abdominal CT. Short segment of intimal calcium displacement involving the aorta at the hiatus, chronic  since at least 09/25/2015, complex plaque versus focal dissection. No associated aneurysm. Musculoskeletal: No acute or aggressive finding. IMPRESSION: 1. No obstructing lesion to explain recurrent pneumonia. There is endobronchial debris and airspace opacities in the lower lobes, would correlate for aspiration. 2. 24 x 14 mm nodule in the left upper lobe. This is 1 of multiple clustered nodules, favoring an infectious process, but CT in 6-8 weeks is recommended to ensure resolution -neoplasm with satellite nodules could also have this appearance. 3. Aortic Atherosclerosis (ICD10-I70.0) that is extensive. 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 Electronically Signed   By: Monte Fantasia M.D.   On: 10/08/2016  16:29   Ct Cervical Spine Wo Contrast  Result Date: 09/12/2016 CLINICAL DATA:  Per EMS, pt is coming from home after experiencing a fall at home last night. Pt lives with her daughter and daughter stated pt did not hit her head when fall occurred. Daughter reports that pt had a fever and a productive cough last night and had increased weakness after the fall occurred. Pt usually ambulates independently but now requires assistance to get up out of the bed. Pt AO x4. EXAM: CT HEAD WITHOUT CONTRAST CT CERVICAL SPINE WITHOUT CONTRAST TECHNIQUE: Multidetector CT imaging of the head and cervical spine was performed following the standard protocol without intravenous contrast. Multiplanar CT image reconstructions of the cervical spine were also generated. COMPARISON:  07/14/2016 FINDINGS: CT HEAD FINDINGS Brain: No evidence of acute infarction, hemorrhage, hydrocephalus, extra-axial collection or mass lesion/mass effect. Age related volume loss. Mild chronic microvascular ischemic change. 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 Stable appearance from the prior exam. Vascular: No hyperdense vessel or unexpected calcification. Skull: Normal. Negative for fracture or focal lesion. Sinuses/Orbits: Visualized globes and orbits are unremarkable. Visualized sinuses and mastoid air cells are clear. Other: None. CT CERVICAL SPINE FINDINGS Alignment: Mild reversal of the normal cervical lordosis, apex at C4. No spondylolisthesis. Skull base and vertebrae: No acute fracture. No primary bone lesion or focal pathologic process. Soft tissues and spinal canal: No prevertebral fluid or swelling. No visible canal hematoma. Disc levels: Mild loss of disc height from C3-C4 through C5-C6. Endplate spurring noted from C4 through C7. Facet degenerative change noted most prominent on the right at C2-C3. No disc herniations. Upper chest: No masses.  No acute findings. Other: None. IMPRESSION: HEAD CT:  No acute intracranial abnormalities.   No skull fracture. CERVICAL CT:  No fracture or acute finding. Electronically Signed   By: Lajean Manes M.D.   On: 09/12/2016 14:29   Dg Chest Port 1 View  Result Date: 10/07/2016 CLINICAL DATA:  Altered mental status. EXAM: PORTABLE CHEST 1 VIEW COMPARISON:  10/07/2016 and prior radiographs FINDINGS: The cardiomediastinal silhouette is unremarkable. Left basilar opacity/ atelectasis again noted. Patchy left upper lung airspace disease is unchanged. The right lung is clear. There is no evidence of pneumothorax. IMPRESSION: Little significant change with left basilar opacity/atelectasis and patchy left upper lung airspace disease again noted. Electronically Signed   By: Margarette Canada M.D.   On: 10/07/2016 14:31   Dg Chest Port 1 View  Result Date: 09/15/2016 CLINICAL DATA:  Rhonchi. EXAM: PORTABLE CHEST 1 VIEW COMPARISON:  09/13/2016 . FINDINGS: Mediastinum hilar structures normal. Cardiomegaly with normal pulmonary vascularity. No focal infiltrate. Stable pleural thickening on the left consistent with scarring. No pneumothorax . IMPRESSION: 1.  Stable cardiomegaly No pulmonary venous congestion. 2. Stable left base pleuroparenchymal thickening consistent with scarring. No focal infiltrate identified. Electronically Signed  By: Hatch   On: 09/15/2016 11:04   Dg Chest Port 1 View  Result Date: 09/13/2016 CLINICAL DATA:  Abnormal breathing sounds. History of COPD, CHF, diabetes. EXAM: PORTABLE CHEST 1 VIEW COMPARISON:  Chest radiograph September 12, 2016 FINDINGS: The cardiac silhouette is mildly enlarged unchanged. Calcified aortic knob. Similar patchy LEFT lung base airspace opacity, blunted costophrenic angle. RIGHT lung is clear. No pneumothorax. Soft tissue planes and included osseous structures are nonsuspicious. IMPRESSION: Stable LEFT basilar airspace opacity with small LEFT pleural effusion versus pleural thickening. Electronically Signed   By: Elon Alas M.D.   On: 09/13/2016 01:58    Dg Swallowing Func-speech Pathology  Result Date: 10/09/2016 Objective Swallowing Evaluation: Type of Study: MBS-Modified Barium Swallow Study Patient Details Name: KENNIS BUELL MRN: [id number] Date of Birth: [birth year] Today's Date: 10/09/2016 Time: SLP Start Time (ACUTE ONLY): 0946-SLP Stop Time (ACUTE ONLY): 1000 SLP Time Calculation (min) (ACUTE ONLY): 14 min Past Medical History: Past Medical History: Diagnosis Date . AKI (acute kidney injury) ([place])  . Anemia in chronic renal disease  . CAD (coronary artery disease) 2006  Taxus stent of mid RCA. negative myoview 2014 and normal LV function . CKD (chronic kidney disease) stage 4, GFR 15-29 ml/min ([facility])  . Complication of anesthesia  . COPD (chronic obstructive pulmonary disease) ([place])  . Diabetes mellitus  . Diastolic heart failure, NYHA class 1 ([place])  . Dyslipidemia  . Dysuria 01/06/2015 . Frozen shoulder   right . Gastroesophageal reflux  . HAP (hospital-acquired pneumonia)  . Headache  . Hyperkalemia 01/01/2016 . Hypothyroidism  . MGUS (monoclonal gammopathy of unknown significance) 01/28/2014 . Obesity  . Pneumonia 06/2016 . Polymyalgia rheumatica ([place])  . PONV (postoperative nausea and vomiting)  . Renal insufficiency  . Respiratory distress  . Schizoaffective disorder ([place])  . Sepsis due to pneumonia ([place])  . Shortness of breath  . Stroke [facility])  Past Surgical History: Past Surgical History: Procedure Laterality Date . ABDOMINAL HYSTERECTOMY   . CARDIAC CATHETERIZATION N/A 08/13/2015  Procedure: Left Heart Cath and Coronary Angiography;  Surgeon: Jettie Booze, MD;  Location: [facility];  Service: Cardiovascular;  Laterality: N/A; . CATARACT EXTRACTION   . CHOLECYSTECTOMY   . CORONARY ANGIOPLASTY WITH STENT PLACEMENT  2006  Taxus DES to RCA, 50 % residual . PARTIAL HYSTERECTOMY  319 HPI: 76 year old female with history of MGUS, mild dementia, hypertension, hypothyroidism, dyslipidemia, PMR on low-dose prednisone , recent hospitalization with  sepsis due to influenza B and left sided PNA, presented with ongoing nausea, vomiting and lower abdominal pain for 1 day prior to admission.  Dx PNA (third hospitalization for pna in three months), UTI, anemia, acute encephalopathy.  SLP asked to evaluate to r/o aspiration.  Subjective: alert, mildly confused Assessment / Plan / Recommendation CHL IP CLINICAL IMPRESSIONS 10/09/2016 Clinical Impression Pt with mild oropharyngeal dysphagia and suspected primary esophageal deficits.   Oral manipulation was timely but decreased propulsion noted resulting in mild vallecular/epiglottic residuals without sensation.   Due to ill timing of laryngeal closure - pt with trace laryngeal penetration of thin liquids.  Trace laryngeal penetration of thin observed to vocal folds SILENTLY with pt taking barium tablet.  Cleared with cued throat clear.  Appearance of delayed clearance from proximal to mid esophagus (could not view lower due to pt's small stature); radiologist not present to confirm findings and MBS is not designed to examine esophagus.  Recommend consider dedicated esophageal evaluation as suspect this is pt's primary aspiration risk.  Using teach back, educated pt to findings/recommendation- including to consume softer foods, eat small frequent meals, follow solids with liquids. Marland Kitchen SLP Visit Diagnosis Dysphagia, unspecified (R13.10) Attention and concentration deficit following -- Frontal lobe and executive function deficit following -- Impact on safety and function Mild aspiration risk   CHL IP TREATMENT RECOMMENDATION 10/09/2016 Treatment Recommendations Defer until completion of intrumental exam   Prognosis 10/09/2016 Prognosis for Safe Diet Advancement Fair Barriers to Reach Goals -- Barriers/Prognosis Comment -- CHL IP DIET RECOMMENDATION 10/09/2016 SLP Diet Recommendations Dysphagia 3 (Mech soft) solids;Thin liquid Liquid Administration via Cup;Straw Medication Administration Whole meds with liquid Compensations  Small sips/bites;Slow rate;Follow solids with liquid Postural Changes Remain semi-upright after after feeds/meals (Comment);Seated upright at 90 degrees   CHL IP OTHER RECOMMENDATIONS 10/09/2016 Recommended Consults Consider esophageal assessment Oral Care Recommendations Oral care BID Other Recommendations --   CHL IP FOLLOW UP RECOMMENDATIONS 10/09/2016 Follow up Recommendations None   CHL IP FREQUENCY AND DURATION 10/09/2016 Speech Therapy Frequency (ACUTE ONLY) min 1 x/week Treatment Duration 1 week      CHL IP ORAL PHASE 10/09/2016 Oral Phase Impaired Oral - Pudding Teaspoon -- Oral - Pudding Cup -- Oral - Honey Teaspoon -- Oral - Honey Cup -- Oral - Nectar Teaspoon -- Oral - Nectar Cup WFL Oral - Nectar Straw -- Oral - Thin Teaspoon -- Oral - Thin Cup WFL Oral - Thin Straw WFL Oral - Puree Reduced posterior propulsion Oral - Mech Soft Reduced posterior propulsion Oral - Regular -- Oral - Multi-Consistency -- Oral - Pill WFL Oral Phase - Comment --  CHL IP PHARYNGEAL PHASE 10/09/2016 Pharyngeal Phase Impaired Pharyngeal- Pudding Teaspoon -- Pharyngeal -- Pharyngeal- Pudding Cup -- Pharyngeal -- Pharyngeal- Honey Teaspoon -- Pharyngeal -- Pharyngeal- Honey Cup -- Pharyngeal -- Pharyngeal- Nectar Teaspoon -- Pharyngeal -- Pharyngeal- Nectar Cup WFL Pharyngeal -- Pharyngeal- Nectar Straw -- Pharyngeal -- Pharyngeal- Thin Teaspoon -- Pharyngeal -- Pharyngeal- Thin Cup Penetration/Aspiration during swallow Pharyngeal Material enters airway, remains ABOVE vocal cords and not ejected out Pharyngeal- Thin Straw Penetration/Aspiration during swallow Pharyngeal Material enters airway, passes BELOW cords then ejected out Pharyngeal- Puree Pharyngeal residue - valleculae;Delayed swallow initiation-vallecula Pharyngeal -- Pharyngeal- Mechanical Soft -- Pharyngeal -- Pharyngeal- Regular Delayed swallow initiation-vallecula;Pharyngeal residue - valleculae Pharyngeal -- Pharyngeal- Multi-consistency -- Pharyngeal -- Pharyngeal-  Pill (No Data) Pharyngeal -- Pharyngeal Comment --  CHL IP CERVICAL ESOPHAGEAL PHASE 10/09/2016 Cervical Esophageal Phase Impaired Pudding Teaspoon -- Pudding Cup -- Honey Teaspoon -- Honey Cup -- Nectar Teaspoon -- Nectar Cup -- Nectar Straw -- Thin Teaspoon -- Thin Cup -- Thin Straw -- Puree -- Mechanical Soft -- Regular -- Multi-consistency -- Pill -- Cervical Esophageal Comment appearance of delayed clearance from proximal to mid esophagus (could not view lower due to pt's small stature); radiologist not present to confirm findings and MBS is not designed to examine esophagus CHL IP GO 10/17/2013 Functional Assessment Tool Used Mini-mental State Examination, ASHA NOMS, clinical judgment Functional Limitations Memory Swallow Current Status [phone]) (None) Swallow Goal Status (Y1856) (None) Swallow Discharge Status (D1497) (None) Motor Speech Current Status [phone] (None) Motor Speech Goal Status (C5885) (None) Motor Speech Goal Status (O2774) (None) Spoken Language Comprehension Current Status (J2878) (None) Spoken Language Comprehension Goal Status (M7672) (None) Spoken Language Comprehension Discharge Status [phone]) (None) Spoken Language Expression Current Status (J6283) (None) Spoken Language Expression Goal Status (M6294) (None) Spoken Language Expression Discharge Status [phone] (None) Attention Current Status (T0354) (None) Attention Goal Status (S5681) (None) Attention Discharge Status (E7517) (None) Memory  Current Status [phone]) CI Memory Goal Status (E9937) CI Memory Discharge Status (G9170) CI Voice Current Status (J6967) (None) Voice Goal Status (E9381) (None) Voice Discharge Status (O1751) (None) Other Speech-Language Pathology Functional Limitation Current Status (W2585) (None) Other Speech-Language Pathology Functional Limitation Goal Status (I7782) (None) Other Speech-Language Pathology Functional Limitation Discharge Status [phone]) (None) Claudie Fisherman, MS [facility] SLP [phone]               Subjective: She relates she feels close to baseline.  Discharge Exam: Vitals:   10/09/16 2200 10/10/16 0538  BP: (!) 161/77 (!) 157/68  Pulse: 73 70  Resp: 16 16  Temp: 98.4 F (36.9 C) 97.7 F (36.5 C)   Vitals:   10/09/16 1400 10/09/16 2200 10/10/16 0538 10/10/16 0745  BP:  (!) 161/77 (!) 157/68   Pulse:  73 70   Resp:  16 16   Temp:  98.4 F (36.9 C) 97.7 F (36.5 C)   TempSrc:  Oral Oral   SpO2: 98% 99% 94% 97%  Weight:      Height:        General: In no acute distress Cardiovascular: Regular rate and rhythm with positive S1-S2. Respiratory: Good air movement clear to auscultation. Abdominal: Positive bowel sounds soft nontender nondistended Extremities: No edema or cyanosis    The results of significant diagnostics from this hospitalization (including imaging, microbiology, ancillary and laboratory) are listed below for reference.     Microbiology: Recent Results (from the past 240 hour(s))  Urine culture     Status: Abnormal   Collection Time: 10/07/16 10:00 AM  Result Value Ref Range Status   Specimen Description URINE, CLEAN CATCH  Final   Special Requests NONE  Final   Culture >=100,000 COLONIES/mL ESCHERICHIA COLI (A)  Final   Report Status 10/09/2016 FINAL  Final   Organism ID, Bacteria ESCHERICHIA COLI (A)  Final      Susceptibility   Escherichia coli - MIC*    AMPICILLIN >=32 RESISTANT Resistant     CEFAZOLIN <=4 SENSITIVE Sensitive     CEFTRIAXONE <=1 SENSITIVE Sensitive     CIPROFLOXACIN >=4 RESISTANT Resistant     GENTAMICIN >=16 RESISTANT Resistant     IMIPENEM <=0.25 SENSITIVE Sensitive     NITROFURANTOIN <=16 SENSITIVE Sensitive     TRIMETH/SULFA >=320 RESISTANT Resistant     AMPICILLIN/SULBACTAM 4 SENSITIVE Sensitive     PIP/TAZO <=4 SENSITIVE Sensitive     Extended ESBL NEGATIVE Sensitive     * >=100,000 COLONIES/mL ESCHERICHIA COLI  Respiratory Panel by PCR     Status: None   Collection Time: 10/07/16  4:43 PM  Result Value  Ref Range Status   Adenovirus NOT DETECTED NOT DETECTED Final   Coronavirus 229E NOT DETECTED NOT DETECTED Final   Coronavirus HKU1 NOT DETECTED NOT DETECTED Final   Coronavirus NL63 NOT DETECTED NOT DETECTED Final   Coronavirus OC43 NOT DETECTED NOT DETECTED Final   Metapneumovirus NOT DETECTED NOT DETECTED Final   Rhinovirus / Enterovirus NOT DETECTED NOT DETECTED Final   Influenza A NOT DETECTED NOT DETECTED Final   Influenza B NOT DETECTED NOT DETECTED Final   Parainfluenza Virus 1 NOT DETECTED NOT DETECTED Final   Parainfluenza Virus 2 NOT DETECTED NOT DETECTED Final   Parainfluenza Virus 3 NOT DETECTED NOT DETECTED Final   Parainfluenza Virus 4 NOT DETECTED NOT DETECTED Final   Respiratory Syncytial Virus NOT DETECTED NOT DETECTED Final   Bordetella pertussis NOT DETECTED NOT DETECTED Final  Chlamydophila pneumoniae NOT DETECTED NOT DETECTED Final   Mycoplasma pneumoniae NOT DETECTED NOT DETECTED Final    Comment: Performed at [facility], [place] [address] [place], [place] [address]  Culture, blood (routine x 2) Call MD if unable to obtain prior to antibiotics being given     Status: None (Preliminary result)   Collection Time: 10/07/16  5:18 PM  Result Value Ref Range Status   Specimen Description BLOOD LEFT ARM  Final   Special Requests IN PEDIATRIC BOTTLE Blood Culture adequate volume  Final   Culture   Final    NO GROWTH 2 DAYS Performed at [facility] [address] [place], [place] [address]    Report Status PENDING  Incomplete  Culture, blood (routine x 2) Call MD if unable to obtain prior to antibiotics being given     Status: None (Preliminary result)   Collection Time: 10/07/16  5:18 PM  Result Value Ref Range Status   Specimen Description LEFT ANTECUBITAL  Final   Special Requests   Final    BOTTLES DRAWN AEROBIC ONLY Blood Culture adequate volume   Culture   Final    NO GROWTH 2 DAYS Performed at [facility], [address]  [place], [place] [address]    Report Status PENDING  Incomplete     Labs: BNP (last 3 results)  Recent Labs  07/14/16 1525  BNP 300.9*   Basic Metabolic Panel:  Recent Labs Lab 10/07/16 0558 10/08/16 0616 10/09/16 0602  NA 141 143 136  K 3.8 4.1 3.9  CL 100* 107 103  CO2 28 25 23   GLUCOSE 122* 87 109*  BUN 46* 40* 28*  CREATININE 2.14* 1.96* 1.48*  CALCIUM 9.7 8.7* 8.2*   Liver Function Tests:  Recent Labs Lab 10/07/16 0558  AST 22  ALT 16  ALKPHOS 47  BILITOT 0.4  PROT 7.1  ALBUMIN 3.6    Recent Labs Lab 10/07/16 0558  LIPASE 21   No results for input(s): AMMONIA in the last 168 hours. CBC:  Recent Labs Lab 10/07/16 0558 10/08/16 0616 10/09/16 0602  WBC 25.0* 18.3* 17.8*  NEUTROABS 19.9*  --   --   HGB 11.6* 10.4* 9.3*  HCT 35.1* 32.4* 27.2*  MCV 91.6 94.5 90.7  PLT 255 202 197   Cardiac Enzymes: No results for input(s): CKTOTAL, [name], [name], TROPONINI in the last 168 hours. BNP: Invalid input(s): POCBNP CBG:  Recent Labs Lab 10/09/16 0702 10/09/16 1148 10/09/16 1612 10/09/16 2204 10/10/16 0700  GLUCAP 99 221* 185* 178* 99   D-Dimer No results for input(s): DDIMER in the last 72 hours. Hgb A1c No results for input(s): HGBA1C in the last 72 hours. Lipid Profile No results for input(s): CHOL, [name], [name], [name], [name], LDLDIRECT in the last 72 hours. Thyroid function studies No results for input(s): TSH, T4TOTAL, T3FREE, THYROIDAB in the last 72 hours.  Invalid input(s): FREET3 Anemia work up No results for input(s): VITAMINB12, FOLATE, FERRITIN, TIBC, IRON, RETICCTPCT in the last 72 hours. Urinalysis    Component Value Date/Time   COLORURINE AMBER (A) 10/07/2016 1000   APPEARANCEUR HAZY (A) 10/07/2016 1000   LABSPEC 1.020 10/07/2016 1000   LABSPEC 1.015 01/06/2015 1542   PHURINE 5.0 10/07/2016 1000   GLUCOSEU NEGATIVE 10/07/2016 1000   GLUCOSEU Negative 01/06/2015 1542   HGBUR NEGATIVE 10/07/2016 1000   HGBUR negative  12/03/2008 1113   BILIRUBINUR NEGATIVE 10/07/2016 1000   BILIRUBINUR Negative 01/06/2015 1542   KETONESUR  5 (A) 10/07/2016 1000   PROTEINUR 30 (A) 10/07/2016 1000   UROBILINOGEN 0.2 01/06/2015 1542   NITRITE NEGATIVE 10/07/2016 1000   LEUKOCYTESUR SMALL (A) 10/07/2016 1000   LEUKOCYTESUR Negative 01/06/2015 1542   Sepsis Labs Invalid input(s): PROCALCITONIN,  WBC,  LACTICIDVEN Microbiology Recent Results (from the past 240 hour(s))  Urine culture     Status: Abnormal   Collection Time: 10/07/16 10:00 AM  Result Value Ref Range Status   Specimen Description URINE, CLEAN CATCH  Final   Special Requests NONE  Final   Culture >=100,000 COLONIES/mL ESCHERICHIA COLI (A)  Final   Report Status 10/09/2016 FINAL  Final   Organism ID, Bacteria ESCHERICHIA COLI (A)  Final      Susceptibility   Escherichia coli - MIC*    AMPICILLIN >=32 RESISTANT Resistant     CEFAZOLIN <=4 SENSITIVE Sensitive     CEFTRIAXONE <=1 SENSITIVE Sensitive     CIPROFLOXACIN >=4 RESISTANT Resistant     GENTAMICIN >=16 RESISTANT Resistant     IMIPENEM <=0.25 SENSITIVE Sensitive     NITROFURANTOIN <=16 SENSITIVE Sensitive     TRIMETH/SULFA >=320 RESISTANT Resistant     AMPICILLIN/SULBACTAM 4 SENSITIVE Sensitive     PIP/TAZO <=4 SENSITIVE Sensitive     Extended ESBL NEGATIVE Sensitive     * >=100,000 COLONIES/mL ESCHERICHIA COLI  Respiratory Panel by PCR     Status: None   Collection Time: 10/07/16  4:43 PM  Result Value Ref Range Status   Adenovirus NOT DETECTED NOT DETECTED Final   Coronavirus 229E NOT DETECTED NOT DETECTED Final   Coronavirus HKU1 NOT DETECTED NOT DETECTED Final   Coronavirus NL63 NOT DETECTED NOT DETECTED Final   Coronavirus OC43 NOT DETECTED NOT DETECTED Final   Metapneumovirus NOT DETECTED NOT DETECTED Final   Rhinovirus / Enterovirus NOT DETECTED NOT DETECTED Final   Influenza A NOT DETECTED NOT DETECTED Final   Influenza B NOT DETECTED NOT DETECTED Final   Parainfluenza Virus 1 NOT  DETECTED NOT DETECTED Final   Parainfluenza Virus 2 NOT DETECTED NOT DETECTED Final   Parainfluenza Virus 3 NOT DETECTED NOT DETECTED Final   Parainfluenza Virus 4 NOT DETECTED NOT DETECTED Final   Respiratory Syncytial Virus NOT DETECTED NOT DETECTED Final   Bordetella pertussis NOT DETECTED NOT DETECTED Final   Chlamydophila pneumoniae NOT DETECTED NOT DETECTED Final   Mycoplasma pneumoniae NOT DETECTED NOT DETECTED Final    Comment: Performed at [facility], [place]. [address], [place], [place] [address]  Culture, blood (routine x 2) Call MD if unable to obtain prior to antibiotics being given     Status: None (Preliminary result)   Collection Time: 10/07/16  5:18 PM  Result Value Ref Range Status   Specimen Description BLOOD LEFT ARM  Final   Special Requests IN PEDIATRIC BOTTLE Blood Culture adequate volume  Final   Culture   Final    NO GROWTH 2 DAYS Performed at [facility] [address], [place], [place] [address]    Report Status PENDING  Incomplete  Culture, blood (routine x 2) Call MD if unable to obtain prior to antibiotics being given     Status: None (Preliminary result)   Collection Time: 10/07/16  5:18 PM  Result Value Ref Range Status   Specimen Description LEFT ANTECUBITAL  Final   Special Requests   Final    BOTTLES DRAWN AEROBIC ONLY Blood Culture adequate volume   Culture   Final    NO GROWTH  2 DAYS Performed at [facility], [place] [address], [place], [place] [address]    Report Status PENDING  Incomplete     Time coordinating discharge: Over 30 minutes  SIGNED:   Charlynne Cousins, MD  [facility] 10/10/2016, 8:14 AM Pager   If 7PM-7AM, please contact night-coverage [email or web address] Password [email or web address]

## 2016-10-10 NOTE — Progress Notes (Signed)
CM received call from MD for outpt referral for hospice through the disciplines of home agency.  CM notified [facility] rep, Jermaine to please note outpt hospice referral for hospice at home and added disciplines as pt has already been set up by previous CM with [facility]. No other CM needs were communicated.

## 2016-10-11 ENCOUNTER — Encounter: Payer: Self-pay | Admitting: Hematology and Oncology

## 2016-10-12 ENCOUNTER — Telehealth: Payer: Self-pay | Admitting: Hematology and Oncology

## 2016-10-12 ENCOUNTER — Other Ambulatory Visit (HOSPITAL_BASED_OUTPATIENT_CLINIC_OR_DEPARTMENT_OTHER): Payer: Medicare Other

## 2016-10-12 ENCOUNTER — Ambulatory Visit (HOSPITAL_BASED_OUTPATIENT_CLINIC_OR_DEPARTMENT_OTHER): Payer: Medicare Other | Admitting: Hematology and Oncology

## 2016-10-12 DIAGNOSIS — D472 Monoclonal gammopathy: Secondary | ICD-10-CM

## 2016-10-12 DIAGNOSIS — D631 Anemia in chronic kidney disease: Secondary | ICD-10-CM | POA: Diagnosis not present

## 2016-10-12 DIAGNOSIS — D72829 Elevated white blood cell count, unspecified: Secondary | ICD-10-CM

## 2016-10-12 DIAGNOSIS — N184 Chronic kidney disease, stage 4 (severe): Principal | ICD-10-CM

## 2016-10-12 DIAGNOSIS — J690 Pneumonitis due to inhalation of food and vomit: Secondary | ICD-10-CM | POA: Diagnosis not present

## 2016-10-12 LAB — CBC WITH DIFFERENTIAL/PLATELET
BASO%: 0.1 % (ref 0.0–2.0)
BASOS ABS: 0 10*3/uL (ref 0.0–0.1)
EOS ABS: 0.1 10*3/uL (ref 0.0–0.5)
EOS%: 0.6 % (ref 0.0–7.0)
HEMATOCRIT: 32.7 % — AB (ref 34.8–46.6)
HEMOGLOBIN: 10.7 g/dL — AB (ref 11.6–15.9)
LYMPH#: 1.2 10*3/uL (ref 0.9–3.3)
LYMPH%: 5.7 % — ABNORMAL LOW (ref 14.0–49.7)
MCH: 30.6 pg (ref 25.1–34.0)
MCHC: 32.7 g/dL (ref 31.5–36.0)
MCV: 93.4 fL (ref 79.5–101.0)
MONO#: 0.8 10*3/uL (ref 0.1–0.9)
MONO%: 3.9 % (ref 0.0–14.0)
NEUT%: 89.7 % — ABNORMAL HIGH (ref 38.4–76.8)
NEUTROS ABS: 18.9 10*3/uL — AB (ref 1.5–6.5)
Platelets: 266 10*3/uL (ref 145–400)
RBC: 3.5 10*6/uL — ABNORMAL LOW (ref 3.70–5.45)
RDW: 13.9 % (ref 11.2–14.5)
WBC: 21 10*3/uL — AB (ref 3.9–10.3)

## 2016-10-12 LAB — CULTURE, BLOOD (ROUTINE X 2)
CULTURE: NO GROWTH
Culture: NO GROWTH
SPECIAL REQUESTS: ADEQUATE
Special Requests: ADEQUATE

## 2016-10-12 NOTE — Telephone Encounter (Signed)
Gave patient relative avs report and appointments for September.

## 2016-10-13 LAB — SEDIMENTATION RATE: SED RATE: 48 mm/h — AB (ref 0–40)

## 2016-10-14 ENCOUNTER — Encounter: Payer: Self-pay | Admitting: Hematology and Oncology

## 2016-10-14 DIAGNOSIS — F250 Schizoaffective disorder, bipolar type: Secondary | ICD-10-CM | POA: Diagnosis not present

## 2016-10-14 NOTE — Assessment & Plan Note (Signed)
She has chronic, intermittent leukocytosis due to prednisone therapy and recurrent infection I reviewed imaging study with the patient's and daughter in great detail The patient has evidence to suggest aspiration pneumonia from recent hospitalization For now, the cause of leukocytosis is reactive in nature.  We will monitor that closely

## 2016-10-14 NOTE — Assessment & Plan Note (Signed)
She had recent aspiration pneumonia, likely triggered by reflux disease We discussed management such as bed elevation, paying attention when she eats and other maneuvers to reduce the risk of reflux

## 2016-10-14 NOTE — Progress Notes (Signed)
[facility] NOTE  Rebecca Hatchet, MD SUMMARY OF HEMATOLOGIC HISTORY:  Rebecca [name] is here because of recently discovered IgG lambda MGUS. Patient herself appears to have some memory deficit. A lot of history was obtained through a review of chart and collaboration of history with her daughter She denies history of abnormal bone pain or bone fracture. The patient had history of polymyalgia rheumatica and had been on intermittent high-dose prednisone therapy in the past, currently on a slow prednisone taper. Patient denies history of recurrent infection or atypical infections such as shingles of meningitis. Denies chills, night sweats, anorexia or abnormal weight loss. She had chronic bruising from antiplatelet agent and prednisone therapy.  INTERVAL HISTORY: Rebecca [name] 76 y.o. female returns for further follow-up. Since last time I saw her, she had recurrent hospitalization due to bowel obstruction. She has been discharged back home Recent imaging study in the hospital also revealed evidence of aspiration pneumonia I reviewed her recent hospital records  I have reviewed the past medical history, past surgical history, social history and family history with the patient and they are unchanged from previous note.  ALLERGIES:  is allergic to levaquin [levofloxacin] and tape.  MEDICATIONS:  Current Outpatient Prescriptions  Medication Sig Dispense Refill  . acetaminophen (TYLENOL) 500 MG tablet Take 1,000 mg by mouth every 6 (six) hours as needed (pain).     Marland Kitchen albuterol (PROAIR HFA) 108 (90 Base) MCG/ACT inhaler Inhale 2 puffs into the lungs every 6 (six) hours as needed for wheezing or shortness of breath.    Marland Kitchen amoxicillin-clavulanate (AUGMENTIN) 875-125 MG tablet Take 1 tablet by mouth every 12 (twelve) hours. 12 tablet 0  . aspirin EC 81 MG EC tablet Take 1 tablet (81 mg total) by mouth daily. 30 tablet 0  . cetirizine (ZYRTEC) 10 MG tablet Take 10 mg by  mouth at bedtime.     . clopidogrel (PLAVIX) 75 MG tablet Take 1 tablet (75 mg total) by mouth daily with breakfast. 30 tablet 0  . clozapine (CLOZARIL) 200 MG tablet Take 300 mg by mouth at bedtime. ]    . Cranberry 250 MG TABS Take 1 tablet by mouth at bedtime.    Marland Kitchen denosumab (PROLIA) 60 MG/ML SOLN injection Inject 60 mg into the skin every 6 (six) months. Administer in upper arm, thigh, or abdomen    . dextromethorphan-guaiFENesin (ROBITUSSIN-DM) 10-100 MG/5ML liquid Take 10 mLs by mouth every 6 (six) hours as needed for cough.    . diltiazem (CARDIZEM LA) 180 MG 24 hr tablet Take 180 mg by mouth daily with breakfast.  0  . docusate sodium (COLACE) 100 MG capsule Take 200 mg by mouth at bedtime.  10 capsule 0  . Fluticasone-Salmeterol (ADVAIR) 250-50 MCG/DOSE AEPB Inhale 1 puff into the lungs at bedtime.    Marland Kitchen HYDROcodone-homatropine (HYCODAN) 5-1.5 MG/5ML syrup Take 5 mLs by mouth 3 (three) times daily. 120 mL 0  . isosorbide mononitrate (IMDUR) 60 MG 24 hr tablet Take 1 tablet (60 mg total) by mouth daily. 30 tablet 6  . levothyroxine (SYNTHROID, LEVOTHROID) 75 MCG tablet Take 75 mcg by mouth daily before breakfast.    . metoprolol succinate (TOPROL-XL) 100 MG 24 hr tablet Take 100 mg by mouth daily with breakfast. Take with or immediately following a meal.    . Multiple Vitamins-Minerals (MULTIVITAMIN WITH MINERALS) tablet Take 1 tablet by mouth daily.    . pantoprazole (PROTONIX) 40 MG tablet Take 1 tablet (40 mg  total) by mouth daily. 30 tablet 11  . polyethylene glycol powder (GLYCOLAX/MIRALAX) powder Take 17 g by mouth daily. [place]    . pravastatin (PRAVACHOL) 20 MG tablet Take 20 mg by mouth at bedtime.    . predniSONE (DELTASONE) 10 MG tablet Take 1 tablet (10 mg total) by mouth daily with breakfast. 30 tablet 0  . Tiotropium Bromide Monohydrate (SPIRIVA RESPIMAT) 2.5 MCG/ACT AERS Inhale 2 puffs into the lungs every morning.     . Vitamin D, Ergocalciferol, (DRISDOL) 50000 UNITS  CAPS capsule Take 1 capsule (50,000 Units total) by mouth every 7 (seven) days. 4 capsule 0  . nitroGLYCERIN (NITROSTAT) 0.4 MG SL tablet Place 1 tablet (0.4 mg total) under the tongue every 5 (five) minutes as needed. For chest pain (Patient not taking: Reported on 10/12/2016) 25 tablet 3  . traMADol (ULTRAM-ER) 300 MG 24 hr tablet   0   No current facility-administered medications for this visit.      REVIEW OF SYSTEMS:  All other systems were reviewed with the patient and are negative.  PHYSICAL EXAMINATION: ECOG PERFORMANCE STATUS: 0 - Asymptomatic  Vitals:   10/12/16 1415  BP: 135/70  Pulse: 86  Resp: 17  Temp: 98.2 F (36.8 C)   Filed Weights   10/12/16 1415  Weight: 140 lb (63.5 kg)    GENERAL:alert, no distress and comfortable.  She looks cushingoid SKIN: skin color, texture, turgor are normal, no rashes or significant lesions EYES: normal, Conjunctiva are pink and non-injected, sclera clear Musculoskeletal:no cyanosis of digits and no clubbing  NEURO: alert & oriented x 3 with fluent speech, no focal motor/sensory deficits  LABORATORY DATA:  I have reviewed the data as listed     Component Value Date/Time   NA 136 10/09/2016 0602   NA 141 09/27/2016 1047   K 3.9 10/09/2016 0602   K 4.8 09/27/2016 1047   CL 103 10/09/2016 0602   CO2 23 10/09/2016 0602   CO2 24 09/27/2016 1047   GLUCOSE 109 (H) 10/09/2016 0602   GLUCOSE 148 (H) 09/27/2016 1047   BUN 28 (H) 10/09/2016 0602   BUN 25.6 09/27/2016 1047   CREATININE 1.48 (H) 10/09/2016 0602   CREATININE 1.4 (H) 09/27/2016 1047   CALCIUM 8.2 (L) 10/09/2016 0602   CALCIUM 9.4 09/27/2016 1047   PROT 7.1 10/07/2016 0558   PROT 5.9 (L) 09/27/2016 1047   PROT 6.4 09/27/2016 1047   ALBUMIN 3.6 10/07/2016 0558   ALBUMIN 3.1 (L) 09/27/2016 1047   AST 22 10/07/2016 0558   AST 14 09/27/2016 1047   ALT 16 10/07/2016 0558   ALT 16 09/27/2016 1047   ALKPHOS 47 10/07/2016 0558   ALKPHOS 42 09/27/2016 1047   BILITOT 0.4  10/07/2016 0558   BILITOT 0.33 09/27/2016 1047   GFRNONAA 33 (L) 10/09/2016 0602   GFRAA 38 (L) 10/09/2016 0602    No results found for: SPEP, UPEP  Lab Results  Component Value Date   WBC 21.0 (H) 10/12/2016   NEUTROABS 18.9 (H) 10/12/2016   HGB 10.7 (L) 10/12/2016   HCT 32.7 (L) 10/12/2016   MCV 93.4 10/12/2016   PLT 266 10/12/2016      Chemistry      Component Value Date/Time   NA 136 10/09/2016 0602   NA 141 09/27/2016 1047   K 3.9 10/09/2016 0602   K 4.8 09/27/2016 1047   CL 103 10/09/2016 0602   CO2 23 10/09/2016 0602   CO2 24 09/27/2016 1047  BUN 28 (H) 10/09/2016 0602   BUN 25.6 09/27/2016 1047   CREATININE 1.48 (H) 10/09/2016 0602   CREATININE 1.4 (H) 09/27/2016 1047   GLU 79 09/20/2016      Component Value Date/Time   CALCIUM 8.2 (L) 10/09/2016 0602   CALCIUM 9.4 09/27/2016 1047   ALKPHOS 47 10/07/2016 0558   ALKPHOS 42 09/27/2016 1047   AST 22 10/07/2016 0558   AST 14 09/27/2016 1047   ALT 16 10/07/2016 0558   ALT 16 09/27/2016 1047   BILITOT 0.4 10/07/2016 0558   BILITOT 0.33 09/27/2016 1047       RADIOGRAPHIC STUDIES: I reviewed the imaging study with the patient and her daughter I have personally reviewed the radiological images as listed and agreed with the findings in the report.   ASSESSMENT & PLAN:  MGUS (monoclonal gammopathy of unknown significance) Clinically, she does not appear to have signs and symptoms to suggest multiple myeloma. 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 I would recommend rechecking her blood work and exam annually Repeat myeloma panel is stable  Leukocytosis She has chronic, intermittent leukocytosis due to prednisone therapy and recurrent infection I reviewed imaging study with the patient's and daughter in great detail The patient has evidence to suggest aspiration pneumonia from recent hospitalization For now, the cause of leukocytosis is reactive in nature.  We will monitor that  closely  Aspiration pneumonia of both lower lobes due to gastric secretions ([place]) She had recent aspiration pneumonia, likely triggered by reflux disease We discussed management such as bed elevation, paying attention when she eats and other maneuvers to reduce the risk of reflux   No orders of the defined types were placed in this encounter.   All questions were answered. The patient knows to call the clinic with any problems, questions or concerns. No barriers to learning was detected.  I spent 15 minutes counseling the patient face to face. The total time spent in the appointment was 20 minutes and more than 50% was on counseling.     Heath Lark, MD 5/3/20186:24 AM

## 2016-10-14 NOTE — Assessment & Plan Note (Signed)
Clinically, she does not appear to have signs and symptoms to suggest multiple myeloma. 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 I would recommend rechecking her blood work and exam annually Repeat myeloma panel is stable

## 2016-10-15 DIAGNOSIS — D72829 Elevated white blood cell count, unspecified: Secondary | ICD-10-CM | POA: Diagnosis not present

## 2016-10-15 DIAGNOSIS — N184 Chronic kidney disease, stage 4 (severe): Secondary | ICD-10-CM | POA: Diagnosis not present

## 2016-10-15 DIAGNOSIS — J690 Pneumonitis due to inhalation of food and vomit: Secondary | ICD-10-CM | POA: Diagnosis not present

## 2016-10-15 DIAGNOSIS — N390 Urinary tract infection, site not specified: Secondary | ICD-10-CM | POA: Diagnosis not present

## 2016-10-19 ENCOUNTER — Encounter (HOSPITAL_COMMUNITY): Payer: Self-pay

## 2016-10-19 ENCOUNTER — Emergency Department (HOSPITAL_COMMUNITY): Payer: Medicare Other

## 2016-10-19 ENCOUNTER — Emergency Department (HOSPITAL_COMMUNITY)
Admission: EM | Admit: 2016-10-19 | Discharge: 2016-10-19 | Disposition: A | Discharge status: Home / Self Care | Payer: Medicare Other | Attending: Emergency Medicine | Admitting: Emergency Medicine

## 2016-10-19 DIAGNOSIS — Z955 Presence of coronary angioplasty implant and graft: Secondary | ICD-10-CM | POA: Insufficient documentation

## 2016-10-19 DIAGNOSIS — T148XXA Other injury of unspecified body region, initial encounter: Secondary | ICD-10-CM

## 2016-10-19 DIAGNOSIS — Y999 Unspecified external cause status: Secondary | ICD-10-CM | POA: Diagnosis not present

## 2016-10-19 DIAGNOSIS — S199XXA Unspecified injury of neck, initial encounter: Secondary | ICD-10-CM | POA: Diagnosis not present

## 2016-10-19 DIAGNOSIS — Z8673 Personal history of transient ischemic attack (TIA), and cerebral infarction without residual deficits: Secondary | ICD-10-CM | POA: Insufficient documentation

## 2016-10-19 DIAGNOSIS — W1839XA Other fall on same level, initial encounter: Secondary | ICD-10-CM | POA: Diagnosis not present

## 2016-10-19 DIAGNOSIS — Z87891 Personal history of nicotine dependence: Secondary | ICD-10-CM | POA: Insufficient documentation

## 2016-10-19 DIAGNOSIS — N184 Chronic kidney disease, stage 4 (severe): Secondary | ICD-10-CM | POA: Insufficient documentation

## 2016-10-19 DIAGNOSIS — E1122 Type 2 diabetes mellitus with diabetic chronic kidney disease: Secondary | ICD-10-CM | POA: Diagnosis not present

## 2016-10-19 DIAGNOSIS — Y939 Activity, unspecified: Secondary | ICD-10-CM | POA: Diagnosis not present

## 2016-10-19 DIAGNOSIS — W19XXXA Unspecified fall, initial encounter: Secondary | ICD-10-CM

## 2016-10-19 DIAGNOSIS — S0003XA Contusion of scalp, initial encounter: Secondary | ICD-10-CM | POA: Insufficient documentation

## 2016-10-19 DIAGNOSIS — Y929 Unspecified place or not applicable: Secondary | ICD-10-CM | POA: Insufficient documentation

## 2016-10-19 DIAGNOSIS — S0990XA Unspecified injury of head, initial encounter: Secondary | ICD-10-CM | POA: Diagnosis present

## 2016-10-19 DIAGNOSIS — I2510 Atherosclerotic heart disease of native coronary artery without angina pectoris: Secondary | ICD-10-CM | POA: Diagnosis not present

## 2016-10-19 DIAGNOSIS — J449 Chronic obstructive pulmonary disease, unspecified: Secondary | ICD-10-CM | POA: Insufficient documentation

## 2016-10-19 DIAGNOSIS — E039 Hypothyroidism, unspecified: Secondary | ICD-10-CM | POA: Diagnosis not present

## 2016-10-19 DIAGNOSIS — Z7982 Long term (current) use of aspirin: Secondary | ICD-10-CM | POA: Diagnosis not present

## 2016-10-19 LAB — I-STAT CHEM 8, ED
BUN: 34 mg/dL — AB (ref 6–20)
Calcium, Ion: 1.18 mmol/L (ref 1.15–1.40)
Chloride: 95 mmol/L — ABNORMAL LOW (ref 101–111)
Creatinine, Ser: 1.5 mg/dL — ABNORMAL HIGH (ref 0.44–1.00)
Glucose, Bld: 86 mg/dL (ref 65–99)
HEMATOCRIT: 27 % — AB (ref 36.0–46.0)
HEMOGLOBIN: 9.2 g/dL — AB (ref 12.0–15.0)
Potassium: 4.4 mmol/L (ref 3.5–5.1)
SODIUM: 129 mmol/L — AB (ref 135–145)
TCO2: 23 mmol/L (ref 0–100)

## 2016-10-19 LAB — I-STAT TROPONIN, ED: TROPONIN I, POC: 0.01 ng/mL (ref 0.00–0.08)

## 2016-10-19 NOTE — ED Provider Notes (Signed)
[facility] DEPT Provider Note   CSN: [id number] Arrival date & time: 10/19/16  1635     History   Chief Complaint Chief Complaint  Patient presents with  . Fall  . Head Injury    HPI Rebecca [name] is a 76 y.o. female who presents with a head injury associated with a mechanical fall that occurred at approximately 3 PM this afternoon. Per patient's daughter, she was bending down to pick up something and started to fall forward but tried to overcompensate and ended up falling backwards landing on her buttocks and hitting her posterior head on the floor. Patient's daughter denies any LOC and patient is able to remember the incident. She was able to immediately ambulate after the fall. She denies any dizziness or chest pain that preceded the fall. On ED arrival, patient is reporting of a generalized mild headache and neck pain. She denies any vision changes or confusion. Daughter states she is acting appropriately. She has had no numbness/weakness of her extremities, no bowel or bladder incontinence, no saddle anesthesia. Patient is currently on Plavix. Patient does report she had a brief episode of chest pain last night at 8pm, but has not had any since that one episode last night and denies any currently. She denies any worsening pain in her extremities, numbness or weakness, or difficulty breathing, abdominal pain, nausea or vomiting.  The history is provided by the patient and a relative.    Past Medical History:  Diagnosis Date  . AKI (acute kidney injury) ([place])   . Anemia in chronic renal disease   . CAD (coronary artery disease) 2006   Taxus stent of mid RCA. negative myoview 2014 and normal LV function  . CKD (chronic kidney disease) stage 4, GFR 15-29 ml/min (HCC)   . Complication of anesthesia   . COPD (chronic obstructive pulmonary disease) ([place])   . Diabetes mellitus   . Diastolic heart failure, NYHA class 1 ([place])   . Dyslipidemia   . Dysuria 01/06/2015  . Frozen shoulder      right  . Gastroesophageal reflux   . HAP (hospital-acquired pneumonia)   . Headache   . Hyperkalemia 01/01/2016  . Hypothyroidism   . 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 Obesity   . Pneumonia 06/2016  . Polymyalgia rheumatica ([place])   . PONV (postoperative nausea and vomiting)   . Renal insufficiency   . Respiratory distress   . Schizoaffective disorder ([place])   . Sepsis due to pneumonia ([place])   . Shortness of breath   . Stroke [facility])     Patient Active Problem List   Diagnosis Date Noted  . Aspiration pneumonia of both lower lobes due to gastric secretions ([place]) 10/10/2016  . Pneumonia 10/07/2016  . Lobar pneumonia, unspecified organism ([place]) 10/07/2016  . Ileus ([place]) 10/07/2016  . Healthcare-associated pneumonia 09/12/2016  . AKI (acute kidney injury) ([place]) 09/12/2016  . HAP (hospital-acquired pneumonia) 09/12/2016  . Respiratory distress 09/12/2016  . Pressure injury of skin 07/15/2016  . Benign essential HTN 07/14/2016  . Acute encephalopathy 07/14/2016  . Coronary artery disease involving native coronary artery of native heart without angina pectoris 05/14/2016  . CKD stage 4, GFR 15-29 ml/min  08/11/2015  . 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 Leukocytosis 01/28/2014  . History of TIA - May 2015- Plavix 10/16/2013  . COPD exacerbation ([place]) 07/16/2013  . Polymyalgia rheumatica ([place]) 10/03/2012  . Trochanteric bursitis of right hip 04/11/2012  . History of  small bowel obstruction 11/04/2010  . Schizoaffective disorder ([place]) 01/10/2008  . Dyslipidemia 01/25/2007  . Hypothyroidism 08/11/2006  . Anemia in chronic renal disease 08/11/2006  . GASTROESOPHAGEAL REFLUX, NO ESOPHAGITIS 08/11/2006    Past Surgical History:  Procedure Laterality Date  . ABDOMINAL HYSTERECTOMY    . CARDIAC CATHETERIZATION N/A 08/13/2015   Procedure: Left Heart Cath and Coronary Angiography;  Surgeon: Jettie Booze, MD;   Location: [facility] CV LAB;  Service: Cardiovascular;  Laterality: N/A;  . CATARACT EXTRACTION    . CHOLECYSTECTOMY    . CORONARY ANGIOPLASTY WITH STENT PLACEMENT  2006   Taxus DES to RCA, 50 % residual  . PARTIAL HYSTERECTOMY  1995    OB History    No data available       Home Medications    Prior to Admission medications   Medication Sig Start Date End Date Taking? Authorizing Provider  acetaminophen (TYLENOL) 500 MG tablet Take 1,000 mg by mouth every 6 (six) hours as needed (pain).    Yes [provider]  albuterol (PROAIR HFA) 108 (90 Base) MCG/ACT inhaler Inhale 2 puffs into the lungs every 6 (six) hours as needed for wheezing or shortness of breath.   Yes [provider]  aspirin EC 81 MG EC tablet Take 1 tablet (81 mg total) by mouth daily. 07/20/16  Yes Robbie Lis, MD  cetirizine (ZYRTEC) 10 MG tablet Take 10 mg by mouth at bedtime.    Yes [provider]  clopidogrel (PLAVIX) 75 MG tablet Take 1 tablet (75 mg total) by mouth daily with breakfast. 10/18/13  Yes Thurnell Lose, MD  clozapine (CLOZARIL) 200 MG tablet Take 300 mg by mouth at bedtime. ]   Yes [provider]  Cranberry 250 MG TABS Take 1 tablet by mouth at bedtime.   Yes [provider]  denosumab (PROLIA) 60 MG/ML SOLN injection Inject 60 mg into the skin every 6 (six) months. Administer in upper arm, thigh, or abdomen   Yes [provider]  dextromethorphan-guaiFENesin (ROBITUSSIN-DM) 10-100 MG/5ML liquid Take 10 mLs by mouth every 6 (six) hours as needed for cough.   Yes [provider]  diltiazem (CARDIZEM LA) 180 MG 24 hr tablet Take 180 mg by mouth daily with breakfast. 07/16/15  Yes [provider]  docusate sodium (COLACE) 100 MG capsule Take 200 mg by mouth at bedtime.  03/01/12  Yes Katherina Mires, MD  Fluticasone-Salmeterol (ADVAIR) 250-50 MCG/DOSE AEPB Inhale 1 puff into the lungs at bedtime.   Yes [provider]    isosorbide mononitrate (IMDUR) 60 MG 24 hr tablet Take 1 tablet (60 mg total) by mouth daily. 03/14/13  Yes Isaiah Serge, NP  levothyroxine (SYNTHROID, LEVOTHROID) 75 MCG tablet Take 75 mcg by mouth daily before breakfast.   Yes [provider]  metoprolol succinate (TOPROL-XL) 100 MG 24 hr tablet Take 100 mg by mouth daily with breakfast. Take with or immediately following a meal.   Yes [provider]  Multiple Vitamins-Minerals (MULTIVITAMIN WITH MINERALS) tablet Take 1 tablet by mouth daily.   Yes [provider]  nitroGLYCERIN (NITROSTAT) 0.4 MG SL tablet Place 1 tablet (0.4 mg total) under the tongue every 5 (five) minutes as needed. For chest pain 01/07/15  Yes Hilty, Nadean Corwin, MD  pantoprazole (PROTONIX) 40 MG tablet Take 1 tablet (40 mg total) by mouth daily. 04/03/13  Yes Hilty, Nadean Corwin, MD  polyethylene glycol powder (GLYCOLAX/MIRALAX) powder Take 17 g by mouth  daily. [place]   Yes [provider]  pravastatin (PRAVACHOL) 20 MG tablet Take 20 mg by mouth at bedtime.   Yes [provider]  predniSONE (DELTASONE) 10 MG tablet Take 1 tablet (10 mg total) by mouth daily with breakfast. 10/11/16  Yes Charlynne Cousins, MD  Tiotropium Bromide Monohydrate (SPIRIVA RESPIMAT) 2.5 MCG/ACT AERS Inhale 2 puffs into the lungs every morning.    Yes [provider]  traMADol (ULTRAM-ER) 300 MG 24 hr tablet  10/04/16  Yes [provider]  Vitamin D, Ergocalciferol, (DRISDOL) 50000 UNITS CAPS capsule Take 1 capsule (50,000 Units total) by mouth every 7 (seven) days. 08/27/13  Yes Kinnie Feil, MD  amoxicillin-clavulanate (AUGMENTIN) 875-125 MG tablet Take 1 tablet by mouth every 12 (twelve) hours. 10/10/16   Charlynne Cousins, MD  HYDROcodone-homatropine [facility]) 5-1.5 MG/5ML syrup Take 5 mLs by mouth 3 (three) times daily. Patient not taking: Reported on 10/19/2016 09/27/16   Heath Lark, MD    Family History Family History   Problem Relation Age of Onset  . Diabetes Mother   . Heart disease Mother   . Heart disease Father   . Arthritis Father   . Healthy Sister   . Heart disease Brother   . Cancer Brother        colon ca  . Osteoporosis Sister   . Heart disease Brother   . Diabetes Brother   . Heart disease Brother   . Diabetes Brother   . Diabetes Brother   . Healthy Brother   . Healthy Brother   . Healthy Brother   . Cancer Maternal Uncle        blood condition  . Cancer Daughter        breast ca  . Cancer Cousin        NHL    Social History Social History  Substance Use Topics  . Smoking status: Former Smoker    Packs/day: 1.00    Years: 35.00    Quit date: 04/14/2004  . Smokeless tobacco: Never Used  . Alcohol use No     Allergies   Levaquin [levofloxacin] and Tape   Review of Systems Review of Systems  Constitutional: Negative for chills and fever.  Eyes: Negative for visual disturbance.  Respiratory: Negative for cough and shortness of breath.   Cardiovascular: Positive for chest pain.  Gastrointestinal: Negative for abdominal pain, diarrhea and nausea.  Genitourinary: Negative for dysuria and hematuria.  Musculoskeletal: Positive for neck pain. Negative for back pain.  Skin: Positive for wound (Hematoma). Negative for rash.  Neurological: Positive for headaches. Negative for dizziness.  All other systems reviewed and are negative.    Physical Exam Updated Vital Signs BP (!) 175/80 (BP Location: Right Arm)   Pulse 60   Temp 97.7 F (36.5 C) (Oral)   Resp 13   Ht 5\' 3"  (1.6 m)   Wt 63 kg   SpO2 93%   BMI 24.62 kg/m   Physical Exam  Constitutional: She is oriented to person, place, and time. She appears well-developed and well-nourished.  HENT:  Head: Normocephalic.    Mouth/Throat: Oropharynx is clear and moist and mucous membranes are normal.  Moderately-sized hematoma with dried blood to the lower parietal region on the left side. No open wound or  laceration. No periorbital tenderness, edema or ecchymosis. No deformity or crepitus noted.  Eyes: Conjunctivae, EOM and lids are normal. Pupils are equal, round, and reactive to light.  EOMs intact without  any difficulty. Small pupils bilaterally but they are equal and reactive.  Neck: Full passive range of motion without pain.  Full flexion/extension and lateral movement of neck without difficulty. Some diffuse muscular midline tenderness to the mid cervical region.  Cardiovascular: Normal rate, regular rhythm, normal heart sounds and normal pulses.   Pulmonary/Chest: Effort normal and breath sounds normal.  Abdominal: Soft. Normal appearance. There is no tenderness.  Musculoskeletal: Normal range of motion.  Full range of motion of bilateral upper and lower series. No tenderness to palpation at bilateral hips.  Neurological: She is alert and oriented to person, place, and time.  Cranial nerves III-XII intact Follows commands, Moves all extremities  5/5 strength to BUE and BLE  Sensation intact throughout  Normal finger to nose. No dysdiadochokinesia. No pronator drift. No slurred speech. No facial droop.   Skin: Skin is warm and dry. Capillary refill takes less than 2 seconds.  Diffuse ecchymosis to bilateral upper extremity.  Psychiatric: She has a normal mood and affect. Her speech is normal.  Nursing note and vitals reviewed.    ED Treatments / Results  Labs (all labs ordered are listed, but only abnormal results are displayed) Labs Reviewed  I-STAT CHEM 8, ED - Abnormal; Notable for the following:       Result Value   Sodium 129 (*)    Chloride 95 (*)    BUN 34 (*)    Creatinine, Ser 1.50 (*)    Hemoglobin 9.2 (*)    HCT 27.0 (*)    All other components within normal limits  I-STAT TROPOININ, ED    EKG  EKG Interpretation None       Radiology No results found.  Procedures Procedures (including critical care time)  Medications Ordered in ED Medications -  No data to display   Initial Impression / Assessment and Plan / ED Course  I have reviewed the triage vital signs and the nursing notes.  Pertinent labs & imaging results that were available during my care of the patient were reviewed by me and considered in my medical decision making (see chart for details).     76 year old female who is on blood thinners who presents with mechanical fall. No LOC, vomiting or abnormal behavior.  ED arrival she had headache. Physical exam shows moderately-sized hematoma on left posterior parietal region. Neurovascularly intact. No neuro deficits on exam. Low suspicion for skull fracture or ICH but given that patient is on blood thinners, imaging is indicated. Will plan to obtain CT head to evaluate for any skull fracture or acute ICH. Will also obtain CT C-spine to evaluate for any fracture. Given brief history of chest pain last night, will obtain EKG and troponin.   Labs and Imaging reviewed. CT head negative for any acute skull fracture or ICH. No subluxation or fracture noted on CT C-spine. Reassment of patient shows that pain has improved. Discussed results with patient and daughter at bedside. Daughter is concerned about a new area of bruising on her LUE. We discussed that due to patient being on blood thinners, she is prone to ecchymosis. Patient is not reporting any pain to the site. Will evaluate with U/S.   Reassuring U/S. Shows small hematoma to the LUE, likely secondary to patient's use of blood thinners. Wound care provided to patients posterior scalp hematoma. Wound explored in depth with no signs of open wound or laceration. Does not indicate repair at this time. Patient is stable for discharge at this time. Patient lives  with daughter who will monitor her closely throughout the night. Strict return precautions discussed. Patient and daughter expresses understanding and agreement to plan.     Final Clinical Impressions(s) / ED Diagnoses   Final  diagnoses:  Fall, initial encounter  Hematoma    New Prescriptions Discharge Medication List as of 10/19/2016  9:58 PM       Volanda Napoleon, PA-C 10/21/16 2258    Fatima Blank, MD 10/22/16 [phone]

## 2016-10-19 NOTE — Discharge Instructions (Signed)
Follow-up with your primary care doctor. Call their office tomorrow and arrange for an appointment in the next 24-48 hours.  Return to the emergency department for any worsening confusion, persistent vomiting, abnormal behavior, any severe headache or any other worsening or concerning symptoms.

## 2016-10-19 NOTE — ED Triage Notes (Signed)
Patient was coming out of a restaurant and ws going down hill and dropped her tea. Patient bent down to pick up tea and fell. Patient has a hematoma to the back of the head. Patient landed on her buttocks then her head hit the pavement. Patient has dried blood, but noa ctive bleeding at this time. MAE.

## 2016-10-19 NOTE — ED Notes (Signed)
Two unsuccessful blood draw attempts  

## 2016-10-19 NOTE — ED Notes (Signed)
Pt's head washed with saline water and gauze at site of wound to clean up for better examination.

## 2016-10-26 DIAGNOSIS — M7989 Other specified soft tissue disorders: Secondary | ICD-10-CM | POA: Diagnosis not present

## 2016-10-26 DIAGNOSIS — M353 Polymyalgia rheumatica: Secondary | ICD-10-CM | POA: Diagnosis not present

## 2016-10-26 DIAGNOSIS — Z6825 Body mass index (BMI) 25.0-25.9, adult: Secondary | ICD-10-CM | POA: Diagnosis not present

## 2016-10-26 DIAGNOSIS — D472 Monoclonal gammopathy: Secondary | ICD-10-CM | POA: Diagnosis not present

## 2016-10-26 DIAGNOSIS — M25559 Pain in unspecified hip: Secondary | ICD-10-CM | POA: Diagnosis not present

## 2016-10-26 DIAGNOSIS — Z7952 Long term (current) use of systemic steroids: Secondary | ICD-10-CM | POA: Diagnosis not present

## 2016-10-26 DIAGNOSIS — E663 Overweight: Secondary | ICD-10-CM | POA: Diagnosis not present

## 2016-10-26 DIAGNOSIS — M810 Age-related osteoporosis without current pathological fracture: Secondary | ICD-10-CM | POA: Diagnosis not present

## 2016-10-26 DIAGNOSIS — N183 Chronic kidney disease, stage 3 (moderate): Secondary | ICD-10-CM | POA: Diagnosis not present

## 2016-10-26 DIAGNOSIS — M150 Primary generalized (osteo)arthritis: Secondary | ICD-10-CM | POA: Diagnosis not present

## 2016-11-01 DIAGNOSIS — Z79899 Other long term (current) drug therapy: Secondary | ICD-10-CM | POA: Diagnosis not present

## 2016-11-01 DIAGNOSIS — M353 Polymyalgia rheumatica: Secondary | ICD-10-CM | POA: Diagnosis not present

## 2016-11-03 ENCOUNTER — Telehealth: Payer: Self-pay | Admitting: Hematology and Oncology

## 2016-11-03 NOTE — Telephone Encounter (Signed)
Faxed records to dr Ardeth Perfect [phone]

## 2016-11-04 DIAGNOSIS — J189 Pneumonia, unspecified organism: Secondary | ICD-10-CM | POA: Diagnosis not present

## 2016-11-04 DIAGNOSIS — R0609 Other forms of dyspnea: Secondary | ICD-10-CM | POA: Diagnosis not present

## 2016-11-04 DIAGNOSIS — Z6825 Body mass index (BMI) 25.0-25.9, adult: Secondary | ICD-10-CM | POA: Diagnosis not present

## 2016-11-04 DIAGNOSIS — J690 Pneumonitis due to inhalation of food and vomit: Secondary | ICD-10-CM | POA: Diagnosis not present

## 2016-11-04 DIAGNOSIS — J449 Chronic obstructive pulmonary disease, unspecified: Secondary | ICD-10-CM | POA: Diagnosis not present

## 2016-11-04 DIAGNOSIS — R0602 Shortness of breath: Secondary | ICD-10-CM | POA: Diagnosis not present

## 2016-11-04 DIAGNOSIS — J181 Lobar pneumonia, unspecified organism: Secondary | ICD-10-CM | POA: Diagnosis not present

## 2016-11-04 DIAGNOSIS — R05 Cough: Secondary | ICD-10-CM | POA: Diagnosis not present

## 2016-11-08 DIAGNOSIS — R918 Other nonspecific abnormal finding of lung field: Secondary | ICD-10-CM | POA: Diagnosis not present

## 2016-11-08 DIAGNOSIS — N390 Urinary tract infection, site not specified: Secondary | ICD-10-CM | POA: Diagnosis not present

## 2016-11-08 DIAGNOSIS — Z6824 Body mass index (BMI) 24.0-24.9, adult: Secondary | ICD-10-CM | POA: Diagnosis not present

## 2016-11-08 DIAGNOSIS — N184 Chronic kidney disease, stage 4 (severe): Secondary | ICD-10-CM | POA: Diagnosis not present

## 2016-11-08 DIAGNOSIS — J449 Chronic obstructive pulmonary disease, unspecified: Secondary | ICD-10-CM | POA: Diagnosis not present

## 2016-11-08 DIAGNOSIS — R1319 Other dysphagia: Secondary | ICD-10-CM | POA: Diagnosis not present

## 2016-11-08 DIAGNOSIS — M353 Polymyalgia rheumatica: Secondary | ICD-10-CM | POA: Diagnosis not present

## 2016-11-08 DIAGNOSIS — J690 Pneumonitis due to inhalation of food and vomit: Secondary | ICD-10-CM | POA: Diagnosis not present

## 2016-11-08 DIAGNOSIS — D72829 Elevated white blood cell count, unspecified: Secondary | ICD-10-CM | POA: Diagnosis not present

## 2016-11-10 DIAGNOSIS — R05 Cough: Secondary | ICD-10-CM | POA: Diagnosis not present

## 2016-11-10 DIAGNOSIS — J181 Lobar pneumonia, unspecified organism: Secondary | ICD-10-CM | POA: Diagnosis not present

## 2016-11-10 DIAGNOSIS — Z6824 Body mass index (BMI) 24.0-24.9, adult: Secondary | ICD-10-CM | POA: Diagnosis not present

## 2016-11-19 ENCOUNTER — Emergency Department (HOSPITAL_COMMUNITY): Payer: Medicare Other

## 2016-11-19 ENCOUNTER — Inpatient Hospital Stay (HOSPITAL_COMMUNITY)
Admission: EM | Admit: 2016-11-19 | Discharge: 2016-11-21 | DRG: 071 | Disposition: A | Discharge status: Home / Self Care | Payer: Medicare Other | Attending: Internal Medicine | Admitting: Internal Medicine

## 2016-11-19 ENCOUNTER — Encounter (HOSPITAL_COMMUNITY): Payer: Self-pay

## 2016-11-19 DIAGNOSIS — J449 Chronic obstructive pulmonary disease, unspecified: Secondary | ICD-10-CM | POA: Diagnosis not present

## 2016-11-19 DIAGNOSIS — E1122 Type 2 diabetes mellitus with diabetic chronic kidney disease: Secondary | ICD-10-CM | POA: Diagnosis present

## 2016-11-19 DIAGNOSIS — N184 Chronic kidney disease, stage 4 (severe): Secondary | ICD-10-CM | POA: Diagnosis present

## 2016-11-19 DIAGNOSIS — I2510 Atherosclerotic heart disease of native coronary artery without angina pectoris: Secondary | ICD-10-CM | POA: Diagnosis present

## 2016-11-19 DIAGNOSIS — N179 Acute kidney failure, unspecified: Secondary | ICD-10-CM | POA: Diagnosis not present

## 2016-11-19 DIAGNOSIS — D631 Anemia in chronic kidney disease: Secondary | ICD-10-CM | POA: Diagnosis present

## 2016-11-19 DIAGNOSIS — E785 Hyperlipidemia, unspecified: Secondary | ICD-10-CM | POA: Diagnosis present

## 2016-11-19 DIAGNOSIS — I5032 Chronic diastolic (congestive) heart failure: Secondary | ICD-10-CM | POA: Diagnosis present

## 2016-11-19 DIAGNOSIS — Z7952 Long term (current) use of systemic steroids: Secondary | ICD-10-CM

## 2016-11-19 DIAGNOSIS — G9341 Metabolic encephalopathy: Principal | ICD-10-CM | POA: Diagnosis present

## 2016-11-19 DIAGNOSIS — E119 Type 2 diabetes mellitus without complications: Secondary | ICD-10-CM | POA: Diagnosis not present

## 2016-11-19 DIAGNOSIS — D472 Monoclonal gammopathy: Secondary | ICD-10-CM | POA: Diagnosis present

## 2016-11-19 DIAGNOSIS — Z8249 Family history of ischemic heart disease and other diseases of the circulatory system: Secondary | ICD-10-CM

## 2016-11-19 DIAGNOSIS — R05 Cough: Secondary | ICD-10-CM | POA: Diagnosis not present

## 2016-11-19 DIAGNOSIS — Z91048 Other nonmedicinal substance allergy status: Secondary | ICD-10-CM

## 2016-11-19 DIAGNOSIS — Z6825 Body mass index (BMI) 25.0-25.9, adult: Secondary | ICD-10-CM | POA: Diagnosis not present

## 2016-11-19 DIAGNOSIS — R319 Hematuria, unspecified: Secondary | ICD-10-CM | POA: Diagnosis not present

## 2016-11-19 DIAGNOSIS — E871 Hypo-osmolality and hyponatremia: Secondary | ICD-10-CM | POA: Diagnosis present

## 2016-11-19 DIAGNOSIS — N189 Chronic kidney disease, unspecified: Secondary | ICD-10-CM

## 2016-11-19 DIAGNOSIS — R300 Dysuria: Secondary | ICD-10-CM | POA: Diagnosis not present

## 2016-11-19 DIAGNOSIS — E039 Hypothyroidism, unspecified: Secondary | ICD-10-CM | POA: Diagnosis present

## 2016-11-19 DIAGNOSIS — Z888 Allergy status to other drugs, medicaments and biological substances status: Secondary | ICD-10-CM

## 2016-11-19 DIAGNOSIS — M353 Polymyalgia rheumatica: Secondary | ICD-10-CM | POA: Diagnosis present

## 2016-11-19 DIAGNOSIS — R4182 Altered mental status, unspecified: Secondary | ICD-10-CM | POA: Diagnosis present

## 2016-11-19 DIAGNOSIS — I130 Hypertensive heart and chronic kidney disease with heart failure and stage 1 through stage 4 chronic kidney disease, or unspecified chronic kidney disease: Secondary | ICD-10-CM | POA: Diagnosis present

## 2016-11-19 DIAGNOSIS — Z7902 Long term (current) use of antithrombotics/antiplatelets: Secondary | ICD-10-CM

## 2016-11-19 DIAGNOSIS — N390 Urinary tract infection, site not specified: Secondary | ICD-10-CM | POA: Diagnosis not present

## 2016-11-19 DIAGNOSIS — D72829 Elevated white blood cell count, unspecified: Secondary | ICD-10-CM | POA: Diagnosis present

## 2016-11-19 DIAGNOSIS — F259 Schizoaffective disorder, unspecified: Secondary | ICD-10-CM | POA: Diagnosis present

## 2016-11-19 DIAGNOSIS — J181 Lobar pneumonia, unspecified organism: Secondary | ICD-10-CM | POA: Diagnosis not present

## 2016-11-19 DIAGNOSIS — R410 Disorientation, unspecified: Secondary | ICD-10-CM | POA: Diagnosis not present

## 2016-11-19 DIAGNOSIS — Z87891 Personal history of nicotine dependence: Secondary | ICD-10-CM

## 2016-11-19 DIAGNOSIS — E860 Dehydration: Secondary | ICD-10-CM | POA: Diagnosis present

## 2016-11-19 DIAGNOSIS — D509 Iron deficiency anemia, unspecified: Secondary | ICD-10-CM | POA: Diagnosis not present

## 2016-11-19 DIAGNOSIS — Z79899 Other long term (current) drug therapy: Secondary | ICD-10-CM

## 2016-11-19 DIAGNOSIS — G9340 Encephalopathy, unspecified: Secondary | ICD-10-CM | POA: Diagnosis present

## 2016-11-19 DIAGNOSIS — Z955 Presence of coronary angioplasty implant and graft: Secondary | ICD-10-CM

## 2016-11-19 LAB — COMPREHENSIVE METABOLIC PANEL
ALT: 14 U/L (ref 14–54)
AST: 18 U/L (ref 15–41)
Albumin: 3.8 g/dL (ref 3.5–5.0)
Alkaline Phosphatase: 59 U/L (ref 38–126)
Anion gap: 11 (ref 5–15)
BUN: 25 mg/dL — ABNORMAL HIGH (ref 6–20)
CHLORIDE: 91 mmol/L — AB (ref 101–111)
CO2: 24 mmol/L (ref 22–32)
Calcium: 9.7 mg/dL (ref 8.9–10.3)
Creatinine, Ser: 1.48 mg/dL — ABNORMAL HIGH (ref 0.44–1.00)
GFR calc Af Amer: 38 mL/min — ABNORMAL LOW (ref >60)
GFR, EST NON AFRICAN AMERICAN: 33 mL/min — AB (ref >60)
Glucose, Bld: 143 mg/dL — ABNORMAL HIGH (ref 65–99)
POTASSIUM: 4.8 mmol/L (ref 3.5–5.1)
Sodium: 126 mmol/L — ABNORMAL LOW (ref 135–145)
Total Bilirubin: 0.4 mg/dL (ref 0.3–1.2)
Total Protein: 7.1 g/dL (ref 6.5–8.1)

## 2016-11-19 LAB — CBC WITH DIFFERENTIAL/PLATELET
BASOS ABS: 0 10*3/uL (ref 0.0–0.1)
BASOS PCT: 0 %
EOS PCT: 0 %
Eosinophils Absolute: 0 10*3/uL (ref 0.0–0.7)
HCT: 30.8 % — ABNORMAL LOW (ref 36.0–46.0)
Hemoglobin: 10.5 g/dL — ABNORMAL LOW (ref 12.0–15.0)
Lymphocytes Relative: 4 %
Lymphs Abs: 0.9 10*3/uL (ref 0.7–4.0)
MCH: 30.6 pg (ref 26.0–34.0)
MCHC: 34.1 g/dL (ref 30.0–36.0)
MCV: 89.8 fL (ref 78.0–100.0)
Monocytes Absolute: 0.6 10*3/uL (ref 0.1–1.0)
Monocytes Relative: 3 %
Neutro Abs: 20.3 10*3/uL — ABNORMAL HIGH (ref 1.7–7.7)
Neutrophils Relative %: 93 %
PLATELETS: 236 10*3/uL (ref 150–400)
RBC: 3.43 MIL/uL — ABNORMAL LOW (ref 3.87–5.11)
RDW: 13.3 % (ref 11.5–15.5)
WBC: 21.8 10*3/uL — ABNORMAL HIGH (ref 4.0–10.5)

## 2016-11-19 LAB — URINALYSIS, ROUTINE W REFLEX MICROSCOPIC
BILIRUBIN URINE: NEGATIVE
Bacteria, UA: NONE SEEN
GLUCOSE, UA: NEGATIVE mg/dL
HGB URINE DIPSTICK: NEGATIVE
KETONES UR: NEGATIVE mg/dL
NITRITE: NEGATIVE
PROTEIN: NEGATIVE mg/dL
Specific Gravity, Urine: 1.004 — ABNORMAL LOW (ref 1.005–1.030)
pH: 6 (ref 5.0–8.0)

## 2016-11-19 LAB — I-STAT CG4 LACTIC ACID, ED: LACTIC ACID, VENOUS: 0.89 mmol/L (ref 0.5–1.9)

## 2016-11-19 MED ORDER — ONDANSETRON HCL 4 MG PO TABS: 4.0000 mg | ORAL_TABLET | Freq: Four times a day (QID) | ORAL | Status: DC | PRN

## 2016-11-19 MED ORDER — METOPROLOL SUCCINATE ER 50 MG PO TB24
100.0000 mg | ORAL_TABLET | Freq: Every day | ORAL | Status: DC
  Administered 2016-11-20 – 2016-11-21 (×2): 100 mg via ORAL
  Filled 2016-11-19 (×2): qty 2

## 2016-11-19 MED ORDER — SODIUM CHLORIDE 0.9 % IV SOLN
INTRAVENOUS | Status: AC
  Administered 2016-11-19: 23:00:00 via INTRAVENOUS

## 2016-11-19 MED ORDER — POLYETHYLENE GLYCOL 3350 17 G PO PACK
17.0000 g | PACK | Freq: Every day | ORAL | Status: DC
  Administered 2016-11-21: 17 g via ORAL
  Filled 2016-11-19: qty 1

## 2016-11-19 MED ORDER — MOMETASONE FURO-FORMOTEROL FUM 200-5 MCG/ACT IN AERO
2.0000 | INHALATION_SPRAY | Freq: Two times a day (BID) | RESPIRATORY_TRACT | Status: DC
  Administered 2016-11-20 – 2016-11-21 (×3): 2 via RESPIRATORY_TRACT
  Filled 2016-11-19: qty 8.8

## 2016-11-19 MED ORDER — DILTIAZEM HCL ER COATED BEADS 180 MG PO CP24
180.0000 mg | ORAL_CAPSULE | Freq: Every day | ORAL | Status: DC
  Administered 2016-11-20 – 2016-11-21 (×2): 180 mg via ORAL
  Filled 2016-11-19 (×2): qty 1

## 2016-11-19 MED ORDER — LORATADINE 10 MG PO TABS
10.0000 mg | ORAL_TABLET | Freq: Every day | ORAL | Status: DC
  Administered 2016-11-20 – 2016-11-21 (×2): 10 mg via ORAL
  Filled 2016-11-19 (×2): qty 1

## 2016-11-19 MED ORDER — TRAMADOL HCL 50 MG PO TABS
50.0000 mg | ORAL_TABLET | Freq: Two times a day (BID) | ORAL | Status: DC | PRN
  Administered 2016-11-20: 50 mg via ORAL
  Filled 2016-11-19: qty 1

## 2016-11-19 MED ORDER — DOCUSATE SODIUM 100 MG PO CAPS
200.0000 mg | ORAL_CAPSULE | Freq: Every day | ORAL | Status: DC
  Administered 2016-11-20: 200 mg via ORAL
  Filled 2016-11-19: qty 2

## 2016-11-19 MED ORDER — PANTOPRAZOLE SODIUM 40 MG PO TBEC
40.0000 mg | DELAYED_RELEASE_TABLET | Freq: Every day | ORAL | Status: DC
  Administered 2016-11-20 – 2016-11-21 (×2): 40 mg via ORAL
  Filled 2016-11-19 (×2): qty 1

## 2016-11-19 MED ORDER — HEPARIN SODIUM (PORCINE) 5000 UNIT/ML IJ SOLN
5000.0000 [IU] | Freq: Three times a day (TID) | INTRAMUSCULAR | Status: DC
  Administered 2016-11-20 – 2016-11-21 (×6): 5000 [IU] via SUBCUTANEOUS
  Filled 2016-11-19 (×6): qty 1

## 2016-11-19 MED ORDER — TIOTROPIUM BROMIDE MONOHYDRATE 18 MCG IN CAPS
1.0000 | ORAL_CAPSULE | Freq: Every morning | RESPIRATORY_TRACT | Status: DC
  Administered 2016-11-20 – 2016-11-21 (×2): 18 ug via RESPIRATORY_TRACT
  Filled 2016-11-19: qty 5

## 2016-11-19 MED ORDER — IPRATROPIUM-ALBUTEROL 0.5-2.5 (3) MG/3ML IN SOLN: 3.0000 mL | Freq: Three times a day (TID) | RESPIRATORY_TRACT | Status: DC | PRN

## 2016-11-19 MED ORDER — ISOSORBIDE MONONITRATE ER 60 MG PO TB24
60.0000 mg | ORAL_TABLET | Freq: Every day | ORAL | Status: DC
  Administered 2016-11-20 – 2016-11-21 (×2): 60 mg via ORAL
  Filled 2016-11-19 (×2): qty 1

## 2016-11-19 MED ORDER — LEVOTHYROXINE SODIUM 75 MCG PO TABS
75.0000 ug | ORAL_TABLET | Freq: Every day | ORAL | Status: DC
  Administered 2016-11-20 – 2016-11-21 (×2): 75 ug via ORAL
  Filled 2016-11-19 (×2): qty 1

## 2016-11-19 MED ORDER — DEXTROSE 5 % IV SOLN
1.0000 g | Freq: Once | INTRAVENOUS | Status: AC
  Administered 2016-11-19: 1 g via INTRAVENOUS
  Filled 2016-11-19: qty 10

## 2016-11-19 MED ORDER — CLOPIDOGREL BISULFATE 75 MG PO TABS
75.0000 mg | ORAL_TABLET | Freq: Every day | ORAL | Status: DC
  Administered 2016-11-20 – 2016-11-21 (×2): 75 mg via ORAL
  Filled 2016-11-19 (×2): qty 1

## 2016-11-19 MED ORDER — ADULT MULTIVITAMIN W/MINERALS CH
1.0000 | ORAL_TABLET | Freq: Every day | ORAL | Status: DC
  Administered 2016-11-20 – 2016-11-21 (×2): 1 via ORAL
  Filled 2016-11-19 (×2): qty 1

## 2016-11-19 MED ORDER — PRAVASTATIN SODIUM 20 MG PO TABS
20.0000 mg | ORAL_TABLET | Freq: Every day | ORAL | Status: DC
  Administered 2016-11-20 (×2): 20 mg via ORAL
  Filled 2016-11-19 (×2): qty 1

## 2016-11-19 MED ORDER — CLOZAPINE 100 MG PO TABS
300.0000 mg | ORAL_TABLET | Freq: Every day | ORAL | Status: DC
  Administered 2016-11-20 (×2): 300 mg via ORAL
  Filled 2016-11-19 (×2): qty 3

## 2016-11-19 MED ORDER — PREDNISONE 10 MG PO TABS
10.0000 mg | ORAL_TABLET | Freq: Every day | ORAL | Status: DC
  Administered 2016-11-20 – 2016-11-21 (×2): 10 mg via ORAL
  Filled 2016-11-19 (×2): qty 1

## 2016-11-19 MED ORDER — SODIUM CHLORIDE 0.9 % IV BOLUS (SEPSIS)
1000.0000 mL | Freq: Once | INTRAVENOUS | Status: AC
  Administered 2016-11-19: 1000 mL via INTRAVENOUS

## 2016-11-19 MED ORDER — VITAMIN D (ERGOCALCIFEROL) 1.25 MG (50000 UNIT) PO CAPS
50000.0000 [IU] | ORAL_CAPSULE | ORAL | Status: DC
  Filled 2016-11-19: qty 1

## 2016-11-19 MED ORDER — ONDANSETRON HCL 4 MG/2ML IJ SOLN: 4.0000 mg | Freq: Four times a day (QID) | INTRAMUSCULAR | Status: DC | PRN

## 2016-11-19 NOTE — ED Triage Notes (Addendum)
Patient reports that she went to see her PCP for a UTI. Patient states later this afternoon her PCP called and told her to go to the ED for elevated WBC.  Patient's daughter arrived after triage and stated the PCP reported a WBC-22.5 and a low Na count

## 2016-11-19 NOTE — H&P (Signed)
History and Physical    Rebecca [name] JAS:[id number] DOB: [birth year] DOA: 11/19/2016  PCP: [name], MD   Patient coming from: Home   Chief Complaint: Confusion, dysuria, abnormal labs  HPI: Rebecca [name] is a 76 y.o. female with medical history significant of MGUS, PMR, CKD-4, HTN, hypothyroidism, COPD, schizoaffective disorder, CAD. Patient presented to the ED today, with complaints of confusion last night, per Daughter who is present and gives some of the history. Patient's daughter says last night patient was complaining of dysuria. They saw pts PCP today, for possible UTI. There are called back today to come to the ED due to abnormal labs- low sodium, high white blood count, also urinalysis showing infection.  Today, patient's daughter notes confusion has improved, no nausea no vomiting no seizures, no frequency no abdominal pain, no dysuria today.   ED Course: Vitals stable, with elevated blood pressure. Lab work showed normal lactic acid- 0.89, white blood count elevated at 21.8, Na- low at 126 creatinine stable at 1.48, UA- not suggestive of infection. Chest x-ray shows by basilar airspace disease likely reflects atelectasis, mild cardiomegaly without failure. Patient was started on IV ceftriaxone for possible UTI pending UA, 1L bolus normal saline was given in the ED.  Review of Systems:  CONSTITUTIONAL- No Fever, weightloss, night sweat or change in appetite. SKIN- No Rash, colour changes or itching. HEAD- No Headache or dizziness. Mouth/throat- No Sorethroat, dentures, or bleeding gums. RESPIRATORY- No Cough or SOB. CARDIAC- No Palpitations or chest pain. NEUROLOGIC- No Numbness, syncope, seizures or burning. [facility]- Denies depression or anxiety.  Past Medical History:  Diagnosis Date  . AKI (acute kidney injury) ([place])   . Anemia in chronic renal disease   . CAD (coronary artery disease) 2006   Taxus stent of mid RCA. negative myoview 2014 and normal LV function  . CKD  (chronic kidney disease) stage 4, GFR 15-29 ml/min (HCC)   . Complication of anesthesia   . COPD (chronic obstructive pulmonary disease) ([place])   . Diabetes mellitus   . Diastolic heart failure, NYHA class 1 ([place])   . Dyslipidemia   . Dysuria 01/06/2015  . Frozen shoulder    right  . Gastroesophageal reflux   . HAP (hospital-acquired pneumonia)   . Headache   . Hyperkalemia 01/01/2016  . Hypothyroidism   . 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 Obesity   . Pneumonia 06/2016  . Polymyalgia rheumatica ([place])   . PONV (postoperative nausea and vomiting)   . Renal insufficiency   . Respiratory distress   . Schizoaffective disorder ([place])   . Sepsis due to pneumonia ([place])   . Shortness of breath   . Stroke [facility])     Past Surgical History:  Procedure Laterality Date  . ABDOMINAL HYSTERECTOMY    . CARDIAC CATHETERIZATION N/A 08/13/2015   Procedure: Left Heart Cath and Coronary Angiography;  Surgeon: Jettie Booze, MD;  Location: [place] [facility];  Service: Cardiovascular;  Laterality: N/A;  . CATARACT EXTRACTION    . CHOLECYSTECTOMY    . CORONARY ANGIOPLASTY WITH STENT PLACEMENT  2006   Taxus DES to RCA, 50 % residual  . PARTIAL HYSTERECTOMY  1995    reports that she quit smoking about 12 years ago. She has a 35.00 pack-year smoking history. She has never used smokeless tobacco. She reports that she does not drink alcohol or use drugs.  Allergies  Allergen Reactions  . Levaquin [Levofloxacin] Other (See Comments)    Ruptured  tendon  . Tape Other (See Comments)    PATIENT'S SKIN IS VERY, VERY THIN AND WILL TEAR AND BRUISE EASILY; PLEASE USE AN ALTERNATIVE (SOMETHING OTHER THAN TAPE)    Family History  Problem Relation Age of Onset  . Diabetes Mother   . Heart disease Mother   . Heart disease Father   . Arthritis Father   . Healthy Sister   . Heart disease Brother   . Cancer Brother        colon ca  . Osteoporosis Sister   . Heart disease  Brother   . Diabetes Brother   . Heart disease Brother   . Diabetes Brother   . Diabetes Brother   . Healthy Brother   . Healthy Brother   . Healthy Brother   . Cancer Maternal Uncle        blood condition  . Cancer Daughter        breast ca  . Cancer Cousin        NHL    Prior to Admission medications   Medication Sig Start Date End Date Taking? Authorizing Provider  acetaminophen (TYLENOL) 500 MG tablet Take 1,000 mg by mouth every 6 (six) hours as needed (pain).    Yes [provider]  cetirizine (ZYRTEC) 10 MG tablet Take 10 mg by mouth at bedtime.    Yes [provider]  clopidogrel (PLAVIX) 75 MG tablet Take 1 tablet (75 mg total) by mouth daily with breakfast. Patient taking differently: Take 75 mg by mouth at bedtime.  10/18/13  Yes Thurnell Lose, MD  cloZAPine (CLOZARIL) 100 MG tablet Take 300 mg by mouth at bedtime. 11/04/16  Yes [provider]  Cranberry 250 MG TABS Take 1 tablet by mouth at bedtime.   Yes [provider]  denosumab (PROLIA) 60 MG/ML SOLN injection Inject 60 mg into the skin every 6 (six) months. Administer in upper arm, thigh, or abdomen   Yes [provider]  diltiazem (CARDIZEM LA) 180 MG 24 hr tablet Take 180 mg by mouth daily with breakfast. 07/16/15  Yes [provider]  docusate sodium (COLACE) 100 MG capsule Take 200 mg by mouth at bedtime.  03/01/12  Yes Katherina Mires, MD  Fluticasone-Salmeterol (ADVAIR) 250-50 MCG/DOSE AEPB Inhale 1 puff into the lungs at bedtime.   Yes [provider]  guaiFENesin (ROBITUSSIN) 100 MG/5ML liquid Take 400 mg by mouth 3 (three) times daily as needed for cough.   Yes [provider]  ipratropium-albuterol (DUONEB) 0.5-2.5 (3) MG/3ML SOLN Inhale 3 mLs into the lungs 3 (three) times daily as needed for cough or wheezing. 11/05/16  Yes [provider]  isosorbide mononitrate (IMDUR) 60 MG 24 hr tablet Take 1 tablet (60 mg total) by mouth  daily. 03/14/13  Yes Isaiah Serge, NP  levothyroxine (SYNTHROID, LEVOTHROID) 75 MCG tablet Take 75 mcg by mouth daily before breakfast.   Yes [provider]  metoprolol succinate (TOPROL-XL) 100 MG 24 hr tablet Take 100 mg by mouth daily with breakfast. Take with or immediately following a meal.   Yes [provider]  Multiple Vitamins-Minerals (MULTIVITAMIN WITH MINERALS) tablet Take 1 tablet by mouth daily.   Yes [provider]  pantoprazole (PROTONIX) 40 MG tablet Take 1 tablet (40 mg total) by mouth daily. 04/03/13  Yes Hilty, Nadean Corwin, MD  polyethylene glycol powder (GLYCOLAX/MIRALAX) powder Take 17 g by mouth daily. [place]   Yes [provider]  pravastatin (PRAVACHOL)  20 MG tablet Take 20 mg by mouth at bedtime.   Yes [provider]  predniSONE (DELTASONE) 10 MG tablet Take 1 tablet (10 mg total) by mouth daily with breakfast. 10/11/16  Yes Charlynne Cousins, MD  Probiotic Product (PROBIOTIC PO) Take 1 capsule by mouth daily.   Yes [provider]  Tiotropium Bromide Monohydrate (SPIRIVA RESPIMAT) 2.5 MCG/ACT AERS Inhale 2 puffs into the lungs every morning.    Yes [provider]  traMADol (ULTRAM) 50 MG tablet Take 50-100 mg by mouth every 6 (six) hours as needed for pain.  10/25/16  Yes [provider]  traMADol (ULTRAM-ER) 300 MG 24 hr tablet Take 300 mg by mouth every morning. 11/01/16  Yes [provider]  Vitamin D, Ergocalciferol, (DRISDOL) 50000 UNITS CAPS capsule Take 1 capsule (50,000 Units total) by mouth every 7 (seven) days. 08/27/13  Yes Kinnie Feil, MD  amoxicillin-clavulanate (AUGMENTIN) 875-125 MG tablet Take 1 tablet by mouth every 12 (twelve) hours. Patient not taking: Reported on 11/19/2016 10/10/16   Charlynne Cousins, MD  aspirin EC 81 MG EC tablet Take 1 tablet (81 mg total) by mouth daily. Patient not taking: Reported on 11/19/2016 07/20/16   Robbie Lis, MD    HYDROcodone-homatropine [facility]) 5-1.5 MG/5ML syrup Take 5 mLs by mouth 3 (three) times daily. Patient not taking: Reported on 10/19/2016 09/27/16   Heath Lark, MD  nitroGLYCERIN (NITROSTAT) 0.4 MG SL tablet Place 1 tablet (0.4 mg total) under the tongue every 5 (five) minutes as needed. For chest pain 01/07/15   Pixie Casino, MD   Physical Exam: Vitals:   11/19/16 1749 11/19/16 1900 11/19/16 1930 11/19/16 2000  BP: (!) 167/89 (!) 160/82 (!) 154/87 (!) 172/80  Pulse: 74 71 70   Resp: 18 17 16 14   Temp: 97.7 F (36.5 C)     TempSrc: Oral     SpO2: 100% 93% 93%   Weight: 62.1 kg (137 lb)     Height: 5\' 3"  (1.6 m)       Constitutional: NAD, calm, comfortable Vitals:   11/19/16 1749 11/19/16 1900 11/19/16 1930 11/19/16 2000  BP: (!) 167/89 (!) 160/82 (!) 154/87 (!) 172/80  Pulse: 74 71 70   Resp: 18 17 16 14   Temp: 97.7 F (36.5 C)     TempSrc: Oral     SpO2: 100% 93% 93%   Weight: 62.1 kg (137 lb)     Height: 5\' 3"  (1.6 m)      Eyes: PERRL, lids and conjunctivae normal ENMT: Mucous membranes are moist. Posterior pharynx clear of any exudate or lesions.Normal dentition.  Neck: normal, supple, no masses, no thyromegaly Respiratory: clear to auscultation bilaterally, no wheezing, no crackles. Normal respiratory effort. No accessory muscle use.  Cardiovascular: Regular rate and rhythm, no murmurs / rubs / gallops. No extremity edema. 2+ pedal pulses. No carotid bruits.  Abdomen: no tenderness, no masses palpated. No hepatosplenomegaly. Bowel sounds positive.  Musculoskeletal: no clubbing / cyanosis. No joint deformity upper and lower extremities. Good ROM, no contractures. Normal muscle tone.  Skin: Purpuric rash bilat upper extremities. Neurologic: CN 2-12 grossly intact. Sensation intact, DTR normal. Strength 5/5 in all 4.  Psychiatric: Alert and oriented x to person, could tell month day of the month but not year. Also said she was at [place] long. Normal mood.   Labs on  Admission: I have personally reviewed following labs and imaging studies  CBC:  Recent Labs Lab 11/19/16 1817  WBC 21.8*  NEUTROABS 20.3*  HGB 10.5*  HCT 30.8*  MCV 89.8  PLT 962   Basic Metabolic Panel:  Recent Labs Lab 11/19/16 1817  NA 126*  K 4.8  CL 91*  CO2 24  GLUCOSE 143*  BUN 25*  CREATININE 1.48*  CALCIUM 9.7   Liver Function Tests:  Recent Labs Lab 11/19/16 1817  AST 18  ALT 14  ALKPHOS 59  BILITOT 0.4  PROT 7.1  ALBUMIN 3.8   Urine analysis:    Component Value Date/Time   COLORURINE STRAW (A) 11/19/2016 2034   APPEARANCEUR CLEAR 11/19/2016 2034   LABSPEC 1.004 (L) 11/19/2016 2034   LABSPEC 1.015 01/06/2015 1542   PHURINE 6.0 11/19/2016 2034   GLUCOSEU NEGATIVE 11/19/2016 2034   GLUCOSEU Negative 01/06/2015 1542   HGBUR NEGATIVE 11/19/2016 2034   HGBUR negative 12/03/2008 1113   BILIRUBINUR NEGATIVE 11/19/2016 2034   BILIRUBINUR Negative 01/06/2015 [place] 11/19/2016 2034   PROTEINUR NEGATIVE 11/19/2016 2034   UROBILINOGEN 0.2 01/06/2015 1542   NITRITE NEGATIVE 11/19/2016 2034   LEUKOCYTESUR TRACE (A) 11/19/2016 2034   LEUKOCYTESUR Negative 01/06/2015 1542    Radiological Exams on Admission: Dg Chest 2 View  Result Date: 11/19/2016 CLINICAL DATA:  Cough.  Elevated white count.  Previous abnormal CT. EXAM: CHEST  2 VIEW COMPARISON:  CT the chest 10/08/2016. FINDINGS: The heart is mildly enlarged without significant interval change. Mild bibasilar airspace disease is less prominent than on prior exams. There is no edema or effusion. There is no upper lobe consolidation. Advanced degenerative changes are again noted at the shoulders bilaterally. IMPRESSION: 1. Bibasilar airspace disease likely reflects atelectasis. No significant consolidation or aspiration is evident on today's study 2. Mild cardiomegaly without failure. Electronically Signed   By: San Morelle M.D.   On: 11/19/2016 20:07   EKG:  None.  Assessment/Plan Principal Problem:   Altered mental status Active Problems:   Hypothyroidism   Dyslipidemia   Anemia in chronic renal disease   Schizoaffective disorder (HCC)   Polymyalgia rheumatica (HCC)   MGUS (monoclonal gammopathy of unknown significance)   Leukocytosis   CKD stage 4, GFR 15-29 ml/min    Coronary artery disease involving native coronary artery of native heart without angina pectoris  Altered Mental Status- ?etiology, now improved- per daughter. Possibly related to hyponatremia. UA- negative, chest x-ray not suggestive of infection. No fever. Has a significant but stable leukocytosis- 21.1, considering MGUS and on chronic steroids for PMR. IV ceftriaxone given in the ED for possible UTI, UA was pending at the time. - Admits to tele, obs - Continue IV fluids n/s  - Follow blood cultures and urine cultures drawn in ED - Hold off on antibiotics for now - PT eval   Hyponatremia- ?etiology. Na- 126 on admission>> 130 over 6 hours. Appears euvolemic. No history of fluid loss.  - Continue hydration n/s at 50 mL an hour  - Urine sodium - Serum osmolality - Urine osmolality - TSH check - BMP a.m.  Leukocytosis, MGUS- follows with Dr. Heath Lark- Feels leukocytosis is reactive- from infection, and from steroids. At this time, no focus of infection identified. - CBC a.m.  Hypothyroidism-  - continue Synthroid 75 g daily - Check TSH  Anemia of chronic disease- Hgb- stable at 10.5.  PMR- on chronic steroids - Continue prednisone 10 mg daily for now - Doubt need for stress dose steroids at this time  CKD- 4- creatinine stable at 1.48. BMP am.  Schizoaffective  disorder-  - Continue clozapine  CAD- reports history of stent placement. Stable , No chest pain shortness of breath. - Continue home On Plavix  DVT prophylaxis:  Heparin  Code Status: Full  Family Communication: DAughter at bedside. Disposition Plan: Home Consults called: None  Admission  status: Tele, obs.   Bethena Roys MD [facility] Pager [phone]  If 2am-7AM, please contact night-coverage [email or web address] Password [email or web address]  11/19/2016, 11:14 PM

## 2016-11-19 NOTE — ED Provider Notes (Signed)
[facility] DEPT Provider Note   CSN: [id number] Arrival date & time: 11/19/16  1742     History   Chief Complaint Chief Complaint  Patient presents with  . Abnormal Lab  . Urinary Tract Infection    HPI Rebecca [name] is a 76 y.o. female.  HPI   76 year old female present from primary care physician's office with concern for urinary tract infection and leukocytosis. Family reports that she I confusion developing last night. Reports she's had similar symptoms with urinary tract infections in the past. Reports she's not acting herself, confused last night, more sleepy this morning. Today also reported some pain with urination. She's had cough which has been chronic, not changed. Daughter reports that she'll sound like she is rattling, particularly at night, and she'll encourage her to cough and breathing treatments. Denies any fevers or chills. Reports that she had some "jerking last night" no history of recent falls or trauma. No headache. No abdominal pain. No diarrhea.  Past Medical History:  Diagnosis Date  . AKI (acute kidney injury) ([place])   . Anemia in chronic renal disease   . CAD (coronary artery disease) 2006   Taxus stent of mid RCA. negative myoview 2014 and normal LV function  . CKD (chronic kidney disease) stage 4, GFR 15-29 ml/min (HCC)   . Complication of anesthesia   . COPD (chronic obstructive pulmonary disease) ([place])   . Diabetes mellitus   . Diastolic heart failure, NYHA class 1 ([place])   . Dyslipidemia   . Dysuria 01/06/2015  . Frozen shoulder    right  . Gastroesophageal reflux   . HAP (hospital-acquired pneumonia)   . Headache   . Hyperkalemia 01/01/2016  . Hypothyroidism   . 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 Obesity   . Pneumonia 06/2016  . Polymyalgia rheumatica ([place])   . PONV (postoperative nausea and vomiting)   . Renal insufficiency   . Respiratory distress   . Schizoaffective disorder ([place])   . Sepsis due to  pneumonia ([place])   . Shortness of breath   . Stroke [facility])     Patient Active Problem List   Diagnosis Date Noted  . Altered mental status 11/19/2016  . Aspiration pneumonia of both lower lobes due to gastric secretions ([place]) 10/10/2016  . Pneumonia 10/07/2016  . Lobar pneumonia, unspecified organism ([place]) 10/07/2016  . Ileus ([place]) 10/07/2016  . Healthcare-associated pneumonia 09/12/2016  . AKI (acute kidney injury) ([place]) 09/12/2016  . HAP (hospital-acquired pneumonia) 09/12/2016  . Respiratory distress 09/12/2016  . Pressure injury of skin 07/15/2016  . Benign essential HTN 07/14/2016  . Acute encephalopathy 07/14/2016  . Coronary artery disease involving native coronary artery of native heart without angina pectoris 05/14/2016  . CKD stage 4, GFR 15-29 ml/min  08/11/2015  . 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 Leukocytosis 01/28/2014  . History of TIA - May 2015- Plavix 10/16/2013  . COPD exacerbation ([place]) 07/16/2013  . Polymyalgia rheumatica ([place]) 10/03/2012  . Trochanteric bursitis of right hip 04/11/2012  . History of small bowel obstruction 11/04/2010  . Schizoaffective disorder ([place]) 01/10/2008  . Dyslipidemia 01/25/2007  . Hypothyroidism 08/11/2006  . Anemia in chronic renal disease 08/11/2006  . GASTROESOPHAGEAL REFLUX, NO ESOPHAGITIS 08/11/2006    Past Surgical History:  Procedure Laterality Date  . ABDOMINAL HYSTERECTOMY    . CARDIAC CATHETERIZATION N/A 08/13/2015   Procedure: Left Heart Cath and Coronary Angiography;  Surgeon: Jettie Booze, MD;  Location: [place] [facility] CV  LAB;  Service: Cardiovascular;  Laterality: N/A;  . CATARACT EXTRACTION    . CHOLECYSTECTOMY    . CORONARY ANGIOPLASTY WITH STENT PLACEMENT  2006   Taxus DES to RCA, 50 % residual  . PARTIAL HYSTERECTOMY  1995    OB History    No data available       Home Medications    Prior to Admission medications   Medication Sig Start Date End Date Taking?  Authorizing Provider  acetaminophen (TYLENOL) 500 MG tablet Take 1,000 mg by mouth every 6 (six) hours as needed (pain).    Yes [provider]  cetirizine (ZYRTEC) 10 MG tablet Take 10 mg by mouth at bedtime.    Yes [provider]  clopidogrel (PLAVIX) 75 MG tablet Take 1 tablet (75 mg total) by mouth daily with breakfast. Patient taking differently: Take 75 mg by mouth at bedtime.  10/18/13  Yes Thurnell Lose, MD  cloZAPine (CLOZARIL) 100 MG tablet Take 300 mg by mouth at bedtime. 11/04/16  Yes [provider]  Cranberry 250 MG TABS Take 1 tablet by mouth at bedtime.   Yes [provider]  denosumab (PROLIA) 60 MG/ML SOLN injection Inject 60 mg into the skin every 6 (six) months. Administer in upper arm, thigh, or abdomen   Yes [provider]  diltiazem (CARDIZEM LA) 180 MG 24 hr tablet Take 180 mg by mouth daily with breakfast. 07/16/15  Yes [provider]  docusate sodium (COLACE) 100 MG capsule Take 200 mg by mouth at bedtime.  03/01/12  Yes Katherina Mires, MD  Fluticasone-Salmeterol (ADVAIR) 250-50 MCG/DOSE AEPB Inhale 1 puff into the lungs at bedtime.   Yes [provider]  guaiFENesin (ROBITUSSIN) 100 MG/5ML liquid Take 400 mg by mouth 3 (three) times daily as needed for cough.   Yes [provider]  ipratropium-albuterol (DUONEB) 0.5-2.5 (3) MG/3ML SOLN Inhale 3 mLs into the lungs 3 (three) times daily as needed for cough or wheezing. 11/05/16  Yes [provider]  isosorbide mononitrate (IMDUR) 60 MG 24 hr tablet Take 1 tablet (60 mg total) by mouth daily. 03/14/13  Yes Isaiah Serge, NP  levothyroxine (SYNTHROID, LEVOTHROID) 75 MCG tablet Take 75 mcg by mouth daily before breakfast.   Yes [provider]  metoprolol succinate (TOPROL-XL) 100 MG 24 hr tablet Take 100 mg by mouth daily with breakfast. Take with or immediately following a meal.   Yes [provider]  Multiple Vitamins-Minerals  (MULTIVITAMIN WITH MINERALS) tablet Take 1 tablet by mouth daily.   Yes [provider]  pantoprazole (PROTONIX) 40 MG tablet Take 1 tablet (40 mg total) by mouth daily. 04/03/13  Yes Hilty, Nadean Corwin, MD  polyethylene glycol powder (GLYCOLAX/MIRALAX) powder Take 17 g by mouth daily. [place]   Yes [provider]  pravastatin (PRAVACHOL) 20 MG tablet Take 20 mg by mouth at bedtime.   Yes [provider]  predniSONE (DELTASONE) 10 MG tablet Take 1 tablet (10 mg total) by mouth daily with breakfast. 10/11/16  Yes Charlynne Cousins, MD  Probiotic Product (PROBIOTIC PO) Take 1 capsule by mouth daily.   Yes [provider]  Tiotropium Bromide Monohydrate (SPIRIVA RESPIMAT) 2.5 MCG/ACT AERS Inhale 2 puffs into the lungs every morning.    Yes [provider]  traMADol (ULTRAM) 50 MG tablet Take 50-100 mg by mouth every 6 (six) hours as needed for pain.  10/25/16  Yes [provider]  traMADol (ULTRAM-ER) 300 MG  24 hr tablet Take 300 mg by mouth every morning. 11/01/16  Yes [provider]  Vitamin D, Ergocalciferol, (DRISDOL) 50000 UNITS CAPS capsule Take 1 capsule (50,000 Units total) by mouth every 7 (seven) days. 08/27/13  Yes Kinnie Feil, MD  amoxicillin-clavulanate (AUGMENTIN) 875-125 MG tablet Take 1 tablet by mouth every 12 (twelve) hours. Patient not taking: Reported on 11/19/2016 10/10/16   Charlynne Cousins, MD  aspirin EC 81 MG EC tablet Take 1 tablet (81 mg total) by mouth daily. Patient not taking: Reported on 11/19/2016 07/20/16   Robbie Lis, MD  HYDROcodone-homatropine [facility]) 5-1.5 MG/5ML syrup Take 5 mLs by mouth 3 (three) times daily. Patient not taking: Reported on 10/19/2016 09/27/16   Heath Lark, MD  nitroGLYCERIN (NITROSTAT) 0.4 MG SL tablet Place 1 tablet (0.4 mg total) under the tongue every 5 (five) minutes as needed. For chest pain 01/07/15   Pixie Casino, MD    Family History Family History  Problem  Relation Age of Onset  . Diabetes Mother   . Heart disease Mother   . Heart disease Father   . Arthritis Father   . Healthy Sister   . Heart disease Brother   . Cancer Brother        colon ca  . Osteoporosis Sister   . Heart disease Brother   . Diabetes Brother   . Heart disease Brother   . Diabetes Brother   . Diabetes Brother   . Healthy Brother   . Healthy Brother   . Healthy Brother   . Cancer Maternal Uncle        blood condition  . Cancer Daughter        breast ca  . Cancer Cousin        NHL    Social History Social History  Substance Use Topics  . Smoking status: Former Smoker    Packs/day: 1.00    Years: 35.00    Quit date: 04/14/2004  . Smokeless tobacco: Never Used  . Alcohol use No     Allergies   Levaquin [levofloxacin] and Tape   Review of Systems Review of Systems  Constitutional: Negative for fever.  HENT: Negative for sore throat.   Eyes: Negative for visual disturbance.  Respiratory: Negative for cough (when daughter asks her to, sounds like she needs to cough) and shortness of breath.   Cardiovascular: Negative for chest pain.  Gastrointestinal: Negative for abdominal pain, diarrhea, nausea and vomiting.  Genitourinary: Positive for dysuria. Negative for difficulty urinating.  Musculoskeletal: Positive for back pain. Negative for neck pain.  Skin: Negative for rash.  Neurological: Negative for syncope and headaches.  Psychiatric/Behavioral: Positive for confusion.     Physical Exam Updated Vital Signs BP (!) 144/66 (BP Location: Right Arm) Comment: nurse notified   Pulse 68   Temp 97.6 F (36.4 C) (Oral)   Resp 16   Ht 5\' 3"  (1.6 m)   Wt 62.1 kg (137 lb)   SpO2 94%   BMI 24.27 kg/m   Physical Exam  Constitutional: She appears well-developed and well-nourished. No distress.  HENT:  Head: Normocephalic and atraumatic.  Eyes: Conjunctivae and EOM are normal.  Neck: Normal range of motion.  Cardiovascular: Normal rate, regular  rhythm, normal heart sounds and intact distal pulses.  Exam reveals no gallop and no friction rub.   No murmur heard. Pulmonary/Chest: Effort normal and breath sounds normal. No respiratory distress. She has no wheezes. She has no rales.  Abdominal: Soft.  She exhibits no distension. There is no tenderness. There is no guarding.  Left CVA tenderness  Musculoskeletal: She exhibits no edema or tenderness.  Neurological: She is alert.  Skin: Skin is warm and dry. No rash noted. She is not diaphoretic. No erythema.  Nursing note and vitals reviewed.    ED Treatments / Results  Labs (all labs ordered are listed, but only abnormal results are displayed) Labs Reviewed  CBC WITH DIFFERENTIAL/PLATELET - Abnormal; Notable for the following:       Result Value   WBC 21.8 (*)    RBC 3.43 (*)    Hemoglobin 10.5 (*)    HCT 30.8 (*)    Neutro Abs 20.3 (*)    All other components within normal limits  COMPREHENSIVE METABOLIC PANEL - Abnormal; Notable for the following:    Sodium 126 (*)    Chloride 91 (*)    Glucose, Bld 143 (*)    BUN 25 (*)    Creatinine, Ser 1.48 (*)    GFR calc non Af Amer 33 (*)    GFR calc Af Amer 38 (*)    All other components within normal limits  URINALYSIS, ROUTINE W REFLEX MICROSCOPIC - Abnormal; Notable for the following:    Color, Urine STRAW (*)    Specific Gravity, Urine 1.004 (*)    Leukocytes, UA TRACE (*)    Squamous Epithelial / LPF 0-5 (*)    All other components within normal limits  BASIC METABOLIC PANEL - Abnormal; Notable for the following:    Sodium 130 (*)    Chloride 95 (*)    Glucose, Bld 153 (*)    BUN 21 (*)    Creatinine, Ser 1.34 (*)    GFR calc non Af Amer 37 (*)    GFR calc Af Amer 43 (*)    All other components within normal limits  CULTURE, BLOOD (ROUTINE X 2)  CULTURE, BLOOD (ROUTINE X 2)  URINE CULTURE  OSMOLALITY, URINE  OSMOLALITY  SODIUM, URINE, RANDOM  BASIC METABOLIC PANEL  I-STAT CG4 LACTIC ACID, ED    EKG  EKG  Interpretation None       Radiology Dg Chest 2 View  Result Date: 11/19/2016 CLINICAL DATA:  Cough.  Elevated white count.  Previous abnormal CT. EXAM: CHEST  2 VIEW COMPARISON:  CT the chest 10/08/2016. FINDINGS: The heart is mildly enlarged without significant interval change. Mild bibasilar airspace disease is less prominent than on prior exams. There is no edema or effusion. There is no upper lobe consolidation. Advanced degenerative changes are again noted at the shoulders bilaterally. IMPRESSION: 1. Bibasilar airspace disease likely reflects atelectasis. No significant consolidation or aspiration is evident on today's study 2. Mild cardiomegaly without failure. Electronically Signed   By: San Morelle M.D.   On: 11/19/2016 20:07    Procedures Procedures (including critical care time)  Medications Ordered in ED Medications  cloZAPine (CLOZARIL) tablet 300 mg (300 mg Oral Given 11/20/16 0026)  ipratropium-albuterol (DUONEB) 0.5-2.5 (3) MG/3ML nebulizer solution 3 mL (not administered)  predniSONE (DELTASONE) tablet 10 mg (not administered)  multivitamin with minerals tablet 1 tablet (not administered)  polyethylene glycol (MIRALAX / GLYCOLAX) packet 17 g (not administered)  diltiazem (CARDIZEM CD) 24 hr capsule 180 mg (not administered)  tiotropium (SPIRIVA) inhalation capsule 18 mcg (not administered)  levothyroxine (SYNTHROID, LEVOTHROID) tablet 75 mcg (not administered)  clopidogrel (PLAVIX) tablet 75 mg (not administered)  mometasone-formoterol (DULERA) 200-5 MCG/ACT inhaler 2 puff (2 puffs Inhalation Not Given  11/20/16 0043)  pravastatin (PRAVACHOL) tablet 20 mg (20 mg Oral Given 11/20/16 0026)  metoprolol succinate (TOPROL-XL) 24 hr tablet 100 mg (not administered)  Vitamin D (Ergocalciferol) (DRISDOL) capsule 50,000 Units (not administered)  pantoprazole (PROTONIX) EC tablet 40 mg (not administered)  isosorbide mononitrate (IMDUR) 24 hr tablet 60 mg (not administered)    loratadine (CLARITIN) tablet 10 mg (not administered)  docusate sodium (COLACE) capsule 200 mg (200 mg Oral Not Given 11/19/16 2345)  traMADol (ULTRAM) tablet 50-100 mg (50 mg Oral Given 11/20/16 0026)  heparin injection 5,000 Units (5,000 Units Subcutaneous Given 11/20/16 0027)  ondansetron (ZOFRAN) tablet 4 mg (not administered)    Or  ondansetron (ZOFRAN) injection 4 mg (not administered)  0.9 %  sodium chloride infusion ( Intravenous New Bag/Given 11/19/16 2306)  sodium chloride 0.9 % bolus 1,000 mL (0 mLs Intravenous Stopped 11/19/16 2254)  cefTRIAXone (ROCEPHIN) 1 g in dextrose 5 % 50 mL IVPB (0 g Intravenous Stopped 11/19/16 2254)     Initial Impression / Assessment and Plan / ED Course  I have reviewed the triage vital signs and the nursing notes.  Pertinent labs & imaging results that were available during my care of the patient were reviewed by me and considered in my medical decision making (see chart for details).    76 year old female with a history of MGUS, coronary artery disease, COPD, diabetes, congestive heart failure, polymyalgia rheumatica, CVA, presents with concern for altered mental status, with discovery of urinary tract infection and leukocytosis by her PCP. Patient's vital signs are within normal limits. Lactic acid is within normal limits. Leukocytosis may be reactive from infection or may be secondary to her prednisone therapy. Urinalysis does look concerning for urinary tract infection in setting of pt having urinary symptoms. CXR not consistent with pneumonia.  Given patient's history of altered mental status,  sign of UTI, white blood cell count, will admit for further care. In addition, patient has worsening hyponatremia with a sodium of 126. Symptoms may be related to symptomatic hyponatremia.    Final Clinical Impressions(s) / ED Diagnoses   Final diagnoses:  Urinary tract infection without hematuria, site unspecified  Leukocytosis, unspecified type  Hyponatremia     New Prescriptions Current Discharge Medication List       Gareth Morgan, MD 11/20/16 0221

## 2016-11-19 NOTE — ED Notes (Signed)
Bed: WA09 Expected date:  Expected time:  Means of arrival:  Comments: Hold for [place]

## 2016-11-19 NOTE — ED Notes (Signed)
Pt gave urine sample but it had stool in it so she is going try later to give another

## 2016-11-20 DIAGNOSIS — N179 Acute kidney failure, unspecified: Secondary | ICD-10-CM | POA: Diagnosis not present

## 2016-11-20 DIAGNOSIS — E785 Hyperlipidemia, unspecified: Secondary | ICD-10-CM | POA: Diagnosis not present

## 2016-11-20 DIAGNOSIS — J449 Chronic obstructive pulmonary disease, unspecified: Secondary | ICD-10-CM | POA: Diagnosis present

## 2016-11-20 DIAGNOSIS — Z91048 Other nonmedicinal substance allergy status: Secondary | ICD-10-CM | POA: Diagnosis not present

## 2016-11-20 DIAGNOSIS — I2510 Atherosclerotic heart disease of native coronary artery without angina pectoris: Secondary | ICD-10-CM

## 2016-11-20 DIAGNOSIS — E039 Hypothyroidism, unspecified: Secondary | ICD-10-CM | POA: Diagnosis not present

## 2016-11-20 DIAGNOSIS — G9341 Metabolic encephalopathy: Secondary | ICD-10-CM | POA: Diagnosis not present

## 2016-11-20 DIAGNOSIS — R410 Disorientation, unspecified: Secondary | ICD-10-CM

## 2016-11-20 DIAGNOSIS — N390 Urinary tract infection, site not specified: Secondary | ICD-10-CM | POA: Diagnosis not present

## 2016-11-20 DIAGNOSIS — N184 Chronic kidney disease, stage 4 (severe): Secondary | ICD-10-CM

## 2016-11-20 DIAGNOSIS — M353 Polymyalgia rheumatica: Secondary | ICD-10-CM | POA: Diagnosis not present

## 2016-11-20 DIAGNOSIS — Z888 Allergy status to other drugs, medicaments and biological substances status: Secondary | ICD-10-CM | POA: Diagnosis not present

## 2016-11-20 DIAGNOSIS — D631 Anemia in chronic kidney disease: Secondary | ICD-10-CM | POA: Diagnosis present

## 2016-11-20 DIAGNOSIS — Z7902 Long term (current) use of antithrombotics/antiplatelets: Secondary | ICD-10-CM | POA: Diagnosis not present

## 2016-11-20 DIAGNOSIS — Z87891 Personal history of nicotine dependence: Secondary | ICD-10-CM | POA: Diagnosis not present

## 2016-11-20 DIAGNOSIS — Z8249 Family history of ischemic heart disease and other diseases of the circulatory system: Secondary | ICD-10-CM | POA: Diagnosis not present

## 2016-11-20 DIAGNOSIS — Z79899 Other long term (current) drug therapy: Secondary | ICD-10-CM | POA: Diagnosis not present

## 2016-11-20 DIAGNOSIS — F259 Schizoaffective disorder, unspecified: Secondary | ICD-10-CM

## 2016-11-20 DIAGNOSIS — E860 Dehydration: Secondary | ICD-10-CM | POA: Diagnosis not present

## 2016-11-20 DIAGNOSIS — G9340 Encephalopathy, unspecified: Secondary | ICD-10-CM | POA: Diagnosis not present

## 2016-11-20 DIAGNOSIS — E1122 Type 2 diabetes mellitus with diabetic chronic kidney disease: Secondary | ICD-10-CM | POA: Diagnosis present

## 2016-11-20 DIAGNOSIS — I130 Hypertensive heart and chronic kidney disease with heart failure and stage 1 through stage 4 chronic kidney disease, or unspecified chronic kidney disease: Secondary | ICD-10-CM | POA: Diagnosis present

## 2016-11-20 DIAGNOSIS — D472 Monoclonal gammopathy: Secondary | ICD-10-CM | POA: Diagnosis not present

## 2016-11-20 DIAGNOSIS — Z955 Presence of coronary angioplasty implant and graft: Secondary | ICD-10-CM | POA: Diagnosis not present

## 2016-11-20 DIAGNOSIS — E871 Hypo-osmolality and hyponatremia: Secondary | ICD-10-CM | POA: Diagnosis not present

## 2016-11-20 DIAGNOSIS — I5032 Chronic diastolic (congestive) heart failure: Secondary | ICD-10-CM | POA: Diagnosis present

## 2016-11-20 LAB — BLOOD CULTURE ID PANEL (REFLEXED)
Acinetobacter baumannii: NOT DETECTED
CANDIDA ALBICANS: NOT DETECTED
CANDIDA GLABRATA: NOT DETECTED
CANDIDA PARAPSILOSIS: NOT DETECTED
CANDIDA TROPICALIS: NOT DETECTED
Candida krusei: NOT DETECTED
ENTEROBACTER CLOACAE COMPLEX: NOT DETECTED
Enterobacteriaceae species: NOT DETECTED
Enterococcus species: NOT DETECTED
Escherichia coli: NOT DETECTED
HAEMOPHILUS INFLUENZAE: NOT DETECTED
KLEBSIELLA OXYTOCA: NOT DETECTED
KLEBSIELLA PNEUMONIAE: NOT DETECTED
Listeria monocytogenes: NOT DETECTED
Methicillin resistance: DETECTED — AB
NEISSERIA MENINGITIDIS: NOT DETECTED
Proteus species: NOT DETECTED
Pseudomonas aeruginosa: NOT DETECTED
STAPHYLOCOCCUS SPECIES: DETECTED — AB
STREPTOCOCCUS PYOGENES: NOT DETECTED
STREPTOCOCCUS SPECIES: NOT DETECTED
Serratia marcescens: NOT DETECTED
Staphylococcus aureus (BCID): NOT DETECTED
Streptococcus agalactiae: NOT DETECTED
Streptococcus pneumoniae: NOT DETECTED

## 2016-11-20 LAB — BASIC METABOLIC PANEL
ANION GAP: 10 (ref 5–15)
ANION GAP: 9 (ref 5–15)
BUN: 21 mg/dL — ABNORMAL HIGH (ref 6–20)
BUN: 22 mg/dL — ABNORMAL HIGH (ref 6–20)
CALCIUM: 8.9 mg/dL (ref 8.9–10.3)
CALCIUM: 9.2 mg/dL (ref 8.9–10.3)
CHLORIDE: 95 mmol/L — AB (ref 101–111)
CHLORIDE: 99 mmol/L — AB (ref 101–111)
CO2: 25 mmol/L (ref 22–32)
CO2: 27 mmol/L (ref 22–32)
CREATININE: 1.34 mg/dL — AB (ref 0.44–1.00)
Creatinine, Ser: 1.18 mg/dL — ABNORMAL HIGH (ref 0.44–1.00)
GFR calc Af Amer: 43 mL/min — ABNORMAL LOW (ref >60)
GFR calc non Af Amer: 37 mL/min — ABNORMAL LOW (ref >60)
GFR calc non Af Amer: 43 mL/min — ABNORMAL LOW (ref >60)
GFR, EST AFRICAN AMERICAN: 50 mL/min — AB (ref >60)
GLUCOSE: 153 mg/dL — AB (ref 65–99)
GLUCOSE: 79 mg/dL (ref 65–99)
Potassium: 3.7 mmol/L (ref 3.5–5.1)
Potassium: 4.3 mmol/L (ref 3.5–5.1)
Sodium: 130 mmol/L — ABNORMAL LOW (ref 135–145)
Sodium: 135 mmol/L (ref 135–145)

## 2016-11-20 LAB — CBC
HEMATOCRIT: 31 % — AB (ref 36.0–46.0)
Hemoglobin: 10.6 g/dL — ABNORMAL LOW (ref 12.0–15.0)
MCH: 30.7 pg (ref 26.0–34.0)
MCHC: 34.2 g/dL (ref 30.0–36.0)
MCV: 89.9 fL (ref 78.0–100.0)
PLATELETS: 226 10*3/uL (ref 150–400)
RBC: 3.45 MIL/uL — AB (ref 3.87–5.11)
RDW: 13.5 % (ref 11.5–15.5)
WBC: 17.2 10*3/uL — AB (ref 4.0–10.5)

## 2016-11-20 LAB — SODIUM, URINE, RANDOM: Sodium, Ur: 29 mmol/L

## 2016-11-20 LAB — OSMOLALITY: OSMOLALITY: 269 mosm/kg — AB (ref 275–295)

## 2016-11-20 LAB — TSH: TSH: 0.703 u[IU]/mL (ref 0.350–4.500)

## 2016-11-20 LAB — OSMOLALITY, URINE: OSMOLALITY UR: 168 mosm/kg — AB (ref 300–900)

## 2016-11-20 MED ORDER — MUPIROCIN 2 % EX OINT: 1.0000 "application " | TOPICAL_OINTMENT | Freq: Two times a day (BID) | CUTANEOUS | Status: DC

## 2016-11-20 MED ORDER — CHLORHEXIDINE GLUCONATE CLOTH 2 % EX PADS: 6.0000 | MEDICATED_PAD | Freq: Every day | CUTANEOUS | Status: DC

## 2016-11-20 MED ORDER — DEXTROSE 5 % IV SOLN
1.0000 g | INTRAVENOUS | Status: DC
  Administered 2016-11-20: 1 g via INTRAVENOUS
  Filled 2016-11-20 (×2): qty 10

## 2016-11-20 MED ORDER — SODIUM CHLORIDE 0.9 % IV SOLN
INTRAVENOUS | Status: DC
  Administered 2016-11-20: 15:00:00 via INTRAVENOUS

## 2016-11-20 NOTE — Progress Notes (Signed)
PHARMACY - PHYSICIAN COMMUNICATION CRITICAL VALUE ALERT - BLOOD CULTURE IDENTIFICATION (BCID)  Results for orders placed or performed during the hospital encounter of 11/19/16  Blood Culture ID Panel (Reflexed) (Collected: 11/19/2016  6:25 PM)  Result Value Ref Range   Enterococcus species NOT DETECTED NOT DETECTED   Listeria monocytogenes NOT DETECTED NOT DETECTED   Staphylococcus species DETECTED (A) NOT DETECTED   Staphylococcus aureus NOT DETECTED NOT DETECTED   Methicillin resistance DETECTED (A) NOT DETECTED   Streptococcus species NOT DETECTED NOT DETECTED   Streptococcus agalactiae NOT DETECTED NOT DETECTED   Streptococcus pneumoniae NOT DETECTED NOT DETECTED   Streptococcus pyogenes NOT DETECTED NOT DETECTED   Acinetobacter baumannii NOT DETECTED NOT DETECTED   Enterobacteriaceae species NOT DETECTED NOT DETECTED   Enterobacter cloacae complex NOT DETECTED NOT DETECTED   Escherichia coli NOT DETECTED NOT DETECTED   Klebsiella oxytoca NOT DETECTED NOT DETECTED   Klebsiella pneumoniae NOT DETECTED NOT DETECTED   Proteus species NOT DETECTED NOT DETECTED   Serratia marcescens NOT DETECTED NOT DETECTED   Haemophilus influenzae NOT DETECTED NOT DETECTED   Neisseria meningitidis NOT DETECTED NOT DETECTED   Pseudomonas aeruginosa NOT DETECTED NOT DETECTED   Candida albicans NOT DETECTED NOT DETECTED   Candida glabrata NOT DETECTED NOT DETECTED   Candida krusei NOT DETECTED NOT DETECTED   Candida parapsilosis NOT DETECTED NOT DETECTED   Candida tropicalis NOT DETECTED NOT DETECTED    Name of physician (or Provider) Contacted: Dr Cathlean Sauer  Changes to prescribed antibiotics required: Currently on no antibiotics.  Received order to continue Ceftriaxone 1gm IV q24h for UTI (initial dose given in ED on 11/19/16).  Everette Rank, PharmD 11/20/2016  5:24 PM

## 2016-11-20 NOTE — Progress Notes (Signed)
PROGRESS NOTE    Rebecca [name]  ZRA:[id number] DOB: Oct 14, 1940 DOA: 11/19/2016 PCP: [name], MD    Brief Narrative:  76 year old female presented with confusion, dysuria and abnormal blood work. Patient is known to have MUGS, chronic kidney disease stage IV, hypertension, hypothyroidism, COPD, and schizoaffective disorder. Patient was recently seen by his primary care provider, diagnosed with urinary infection and blood work was performed. Patient was called back to come to the ED due to hyponatremia and leukocytosis. On initial physical examination blood pressure 167/89, heart rate 74, respiratory 18, temperature 97.7, oxygen saturation 100%. Moist mucous membranes, lungs were clear auscultation bilaterally, heart S1-S2 present rhythmic, abdomen soft nontender, lower extremities with no edema. Patient was disoriented to time. Sodium 126, potassium 4.8, chloride 91, bicarbonate 24, glucose 143, BUN 25, creatinine 1.48, serum osmolality 269, white count 21.8, troponin 10.5, hematocrit 30.8, platelets 236. Urinalysis with 6-30 white cells. Urine sodium 29 with urine osmolality 168. Chest x-ray was hypoinflated, no infiltrates.  Patient admitted to hospital with a metabolic encephalopathy due to urinary tract infection, complicated by hyponatremia and dehydration.   Assessment & Plan:   Principal Problem:   Altered mental status Active Problems:   Hypothyroidism   Dyslipidemia   Anemia in chronic renal disease   Schizoaffective disorder (HCC)   Polymyalgia rheumatica (HCC)   MGUS (monoclonal gammopathy of unknown significance)   Leukocytosis   CKD stage 4, GFR 15-29 ml/min    Coronary artery disease involving native coronary artery of native heart without angina pectoris    1. Metabolic encephalopathy. Patient more awake and alert, will continue maintenance IV fluids, neuro checks per unit protocol and physical therapy evaluation.   2. Urine tract infection. Will continue  ceftriaxone IV, will follow on urine culture, cell count and temperature curve. WBC at 17,2 from 21. Old records personally reviewed urine culture from 4/18, E. Coli sensitive to cephalosporins but resistant to ciprofloxacin.   3. AKI on CKD stage IV with Hyponatremia. Low urine Na with diluted urine, suggestive pre-renal hyponatremia, will continue saline IV and will follow on renal panel in am, avoid hypotension and nephrotoxic medications. Renal function with cr down to 1,18 from 1.34. Serum K at 4,3 and serum bicarbonate at 27.   4. Hypothyroid. Will continue levothyroxin per home regimen.   6. Schizoaffective disorder. Will continue clozapine.   7. COPD. Stable, no signs of exacerbation, will continue tiotropium, dulera.  8. HTN with CAD. Will continue isosorbide, metoprolol and diltiazem.    9. Polymyalgia rheumatica. Continue prednisone.      DVT prophylaxis: heparin  Code Status: Full  Family Communication:  Disposition Plan:    Consultants:     Procedures:    Antimicrobials:   Ceftriaxone    Subjective: Patient with improved mentation, no confusion or agitation, no chest pain or dyspnea, no abdominal pain. Positive increase urinary frequency, no dysuria or hematuria.   Objective: Vitals:   11/19/16 2000 11/19/16 2345 11/20/16 0146 11/20/16 0617  BP: (!) 172/80 (!) 181/86 (!) 144/66 (!) 144/61  Pulse:  68  64  Resp: 14 16  16   Temp:  97.6 F (36.4 C)  98.1 F (36.7 C)  TempSrc:  Oral  Oral  SpO2:  94%  95%  Weight:      Height:        Intake/Output Summary (Last 24 hours) at 11/20/16 1313 Last data filed at 11/20/16 1126  Gross per 24 hour  Intake  2329.18 ml  Output              601 ml  Net          1728.18 ml   Filed Weights   11/19/16 1749  Weight: 62.1 kg (137 lb)    Examination:  General exam: deconditioned E ENT. Mild pallor, oral mucosa moist   Respiratory system: Positive scattered rhonchi bilaterally, no rales or wheezing.   Cardiovascular system: S1 & S2 heard, RRR. No JVD, murmurs, rubs, gallops or clicks. No pedal edema. Gastrointestinal system: Abdomen is nondistended, soft and nontender. No organomegaly or masses felt. Normal bowel sounds heard. Central nervous system: Alert and oriented. No focal neurological deficits. Extremities: Symmetric 5 x 5 power. Skin: No rashes, lesions or ulcers     Data Reviewed: I have personally reviewed following labs and imaging studies  CBC:  Recent Labs Lab 11/19/16 1817 11/20/16 0814  WBC 21.8* 17.2*  NEUTROABS 20.3*  --   HGB 10.5* 10.6*  HCT 30.8* 31.0*  MCV 89.8 89.9  PLT 236 099   Basic Metabolic Panel:  Recent Labs Lab 11/19/16 1817 11/20/16 0019 11/20/16 0814  NA 126* 130* 135  K 4.8 3.7 4.3  CL 91* 95* 99*  CO2 24 25 27   GLUCOSE 143* 153* 79  BUN 25* 21* 22*  CREATININE 1.48* 1.34* 1.18*  CALCIUM 9.7 8.9 9.2   GFR: Estimated Creatinine Clearance: 33 mL/min (A) (by C-G formula based on SCr of 1.18 mg/dL (H)). Liver Function Tests:  Recent Labs Lab 11/19/16 1817  AST 18  ALT 14  ALKPHOS 59  BILITOT 0.4  PROT 7.1  ALBUMIN 3.8   No results for input(s): LIPASE, AMYLASE in the last 168 hours. No results for input(s): AMMONIA in the last 168 hours. Coagulation Profile: No results for input(s): INR, PROTIME in the last 168 hours. Cardiac Enzymes: No results for input(s): CKTOTAL, [name], [name], TROPONINI in the last 168 hours. BNP (last 3 results) No results for input(s): PROBNP in the last 8760 hours. HbA1C: No results for input(s): HGBA1C in the last 72 hours. CBG: No results for input(s): GLUCAP in the last 168 hours. Lipid Profile: No results for input(s): CHOL, [name], [name], [name], [name], LDLDIRECT in the last 72 hours. Thyroid Function Tests:  Recent Labs  11/20/16 0814  TSH 0.703   Anemia Panel: No results for input(s): VITAMINB12, FOLATE, FERRITIN, TIBC, IRON, RETICCTPCT in the last 72 hours. Sepsis  Labs:  Recent Labs Lab 11/19/16 1827  LATICACIDVEN 0.89    No results found for this or any previous visit (from the past 240 hour(s)).       Radiology Studies: Dg Chest 2 View  Result Date: 11/19/2016 CLINICAL DATA:  Cough.  Elevated white count.  Previous abnormal CT. EXAM: CHEST  2 VIEW COMPARISON:  CT the chest 10/08/2016. FINDINGS: The heart is mildly enlarged without significant interval change. Mild bibasilar airspace disease is less prominent than on prior exams. There is no edema or effusion. There is no upper lobe consolidation. Advanced degenerative changes are again noted at the shoulders bilaterally. IMPRESSION: 1. Bibasilar airspace disease likely reflects atelectasis. No significant consolidation or aspiration is evident on today's study 2. Mild cardiomegaly without failure. Electronically Signed   By: San Morelle M.D.   On: 11/19/2016 20:07        Scheduled Meds: . clopidogrel  75 mg Oral Q breakfast  . cloZAPine  300 mg Oral QHS  . diltiazem  180 mg Oral Q breakfast  .  docusate sodium  200 mg Oral QHS  . heparin  5,000 Units Subcutaneous Q8H  . isosorbide mononitrate  60 mg Oral Daily  . levothyroxine  75 mcg Oral QAC breakfast  . loratadine  10 mg Oral Daily  . metoprolol succinate  100 mg Oral Q breakfast  . mometasone-formoterol  2 puff Inhalation BID  . multivitamin with minerals  1 tablet Oral Daily  . pantoprazole  40 mg Oral Daily  . polyethylene glycol  17 g Oral Daily  . pravastatin  20 mg Oral QHS  . predniSONE  10 mg Oral Q breakfast  . tiotropium  1 capsule Inhalation q morning - 10a  . Vitamin D (Ergocalciferol)  50,000 Units Oral Q7 days   Continuous Infusions:   LOS: 0 days        Malikiah Debarr Gerome Apley, MD [facility] Pager [phone]  If 7PM-7AM, please contact night-coverage [email or web address] Password [email or web address] 11/20/2016, 1:13 PM

## 2016-11-20 NOTE — Evaluation (Signed)
Physical Therapy Evaluation Patient Details Name: Rebecca [name] MRN: [id number] DOB: 12-16-1940 Today's Date: 11/20/2016   History of Present Illness   76 y.o. female with medical history significant of MGUS, PMR, CKD-4, HTN, hypothyroidism, COPD, schizoaffective disorder, CAD. Patient presented to the ED today, with complaints of confusion. Dx of UTI, hyponatremia.  Clinical Impression  Pt is independent with mobility using her 4 wheeled RW. She ambulated 250' with her rollator with no loss of balance. Pt demonstrated good safety awareness. No further PT indicated, no DME needs. Pt lives with daughter who is available 62*. PT signing off.     Follow Up Recommendations No PT follow up    Equipment Recommendations  None recommended by PT    Recommendations for Other Services       Precautions / Restrictions Precautions Precautions: Fall Precaution Comments: reports h/o 2 recent falls Restrictions Weight Bearing Restrictions: No      Mobility  Bed Mobility Overal bed mobility: Modified Independent             General bed mobility comments: with rail  Transfers Overall transfer level: Needs assistance Equipment used: None Transfers: Sit to/from Stand Sit to Stand: Supervision         General transfer comment: supervision for safety, no LOB  Ambulation/Gait Ambulation/Gait assistance: Supervision Ambulation Distance (Feet): 250 Feet Assistive device: 4-wheeled walker Gait Pattern/deviations: Decreased stride length;Step-through pattern   Gait velocity interpretation: at or above normal speed for age/gender General Gait Details: steady, no LOB, one seated rest break on rollator, pt demonstrated good use of brakes prior to sitting, HR 70s walking  Stairs            Wheelchair Mobility    Modified Rankin (Stroke Patients Only)       Balance Overall balance assessment: Modified Independent                                            Pertinent Vitals/Pain Pain Assessment: No/denies pain    Home Living Family/patient expects to be discharged to:: Private residence Living Arrangements: Children Available Help at Discharge: Family;Available 24 hours/day Type of Home: House Home Access: Stairs to enter Entrance Stairs-Rails: Right;Left;Can reach both Entrance Stairs-Number of Steps: 4 Home Layout: Two level;Able to live on main level with bedroom/bathroom Home Equipment: Shower seat;Walker - 2 wheels;Walker - 4 wheels;Hospital bed;Bedside commode      Prior Function Level of Independence: Independent with assistive device(s)         Comments: used rollator when out of the house.       Hand Dominance   Dominant Hand: Right    Extremity/Trunk Assessment   Upper Extremity Assessment Upper Extremity Assessment: RUE deficits/detail RUE Deficits / Details: elevation ~50* (pt reports this is baseline)    Lower Extremity Assessment Lower Extremity Assessment: Overall WFL for tasks assessed    Cervical / Trunk Assessment Cervical / Trunk Assessment: Normal  Communication   Communication: No difficulties  Cognition Arousal/Alertness: Awake/alert Behavior During Therapy: WFL for tasks assessed/performed Overall Cognitive Status: No family/caregiver present to determine baseline cognitive functioning                                 General Comments: can follow commands, mild confusion      General Comments  Exercises     Assessment/Plan    PT Assessment Patent does not need any further PT services  PT Problem List         PT Treatment Interventions      PT Goals (Current goals can be found in the Care Plan section)  Acute Rehab PT Goals PT Goal Formulation: All assessment and education complete, DC therapy    Frequency     Barriers to discharge        Co-evaluation               AM-PAC PT "6 Clicks" Daily Activity  Outcome Measure Difficulty turning  over in bed (including adjusting bedclothes, sheets and blankets)?: None Difficulty moving from lying on back to sitting on the side of the bed? : None Difficulty sitting down on and standing up from a chair with arms (e.g., wheelchair, bedside commode, etc,.)?: None Help needed moving to and from a bed to chair (including a wheelchair)?: None Help needed walking in hospital room?: None Help needed climbing 3-5 steps with a railing? : A Little 6 Click Score: 23    End of Session Equipment Utilized During Treatment: Gait belt Activity Tolerance: Patient tolerated treatment well Patient left: in chair;with call bell/phone within reach;with chair alarm set Nurse Communication: Mobility status;Other (comment) (reddness and soreness in sacral area)      Time: 8850-2774 PT Time Calculation (min) (ACUTE ONLY): 24 min   Charges:   PT Evaluation $PT Eval Low Complexity: 1 Procedure PT Treatments $Gait Training: 8-22 mins   PT G Codes:   PT G-Codes **NOT FOR INPATIENT CLASS** Functional Assessment Tool Used: AM-PAC 6 Clicks Basic Mobility Functional Limitation: Mobility: Walking and moving around Mobility: Walking and Moving Around Current Status ([id number]): At least 1 percent but less than 20 percent impaired, limited or restricted Mobility: Walking and Moving Around Goal Status [phone]): At least 1 percent but less than 20 percent impaired, limited or restricted      Philomena Doheny 11/20/2016, 8:45 AM  [phone]

## 2016-11-21 DIAGNOSIS — D472 Monoclonal gammopathy: Secondary | ICD-10-CM

## 2016-11-21 DIAGNOSIS — E871 Hypo-osmolality and hyponatremia: Secondary | ICD-10-CM

## 2016-11-21 DIAGNOSIS — G9340 Encephalopathy, unspecified: Secondary | ICD-10-CM

## 2016-11-21 DIAGNOSIS — N179 Acute kidney failure, unspecified: Secondary | ICD-10-CM

## 2016-11-21 DIAGNOSIS — E860 Dehydration: Secondary | ICD-10-CM

## 2016-11-21 LAB — CBC WITH DIFFERENTIAL/PLATELET
BASOS ABS: 0 10*3/uL (ref 0.0–0.1)
Basophils Relative: 0 %
EOS ABS: 0.3 10*3/uL (ref 0.0–0.7)
EOS PCT: 2 %
HCT: 27.9 % — ABNORMAL LOW (ref 36.0–46.0)
Hemoglobin: 9.4 g/dL — ABNORMAL LOW (ref 12.0–15.0)
LYMPHS PCT: 20 %
Lymphs Abs: 2.9 10*3/uL (ref 0.7–4.0)
MCH: 30.1 pg (ref 26.0–34.0)
MCHC: 33.7 g/dL (ref 30.0–36.0)
MCV: 89.4 fL (ref 78.0–100.0)
MONO ABS: 1.2 10*3/uL — AB (ref 0.1–1.0)
Monocytes Relative: 8 %
Neutro Abs: 9.8 10*3/uL — ABNORMAL HIGH (ref 1.7–7.7)
Neutrophils Relative %: 70 %
PLATELETS: 229 10*3/uL (ref 150–400)
RBC: 3.12 MIL/uL — AB (ref 3.87–5.11)
RDW: 13.5 % (ref 11.5–15.5)
WBC: 14.1 10*3/uL — AB (ref 4.0–10.5)

## 2016-11-21 LAB — BASIC METABOLIC PANEL
ANION GAP: 8 (ref 5–15)
BUN: 26 mg/dL — ABNORMAL HIGH (ref 6–20)
CALCIUM: 8.5 mg/dL — AB (ref 8.9–10.3)
CO2: 25 mmol/L (ref 22–32)
Chloride: 106 mmol/L (ref 101–111)
Creatinine, Ser: 1.18 mg/dL — ABNORMAL HIGH (ref 0.44–1.00)
GFR calc Af Amer: 50 mL/min — ABNORMAL LOW (ref >60)
GFR, EST NON AFRICAN AMERICAN: 43 mL/min — AB (ref >60)
Glucose, Bld: 99 mg/dL (ref 65–99)
POTASSIUM: 4.2 mmol/L (ref 3.5–5.1)
Sodium: 139 mmol/L (ref 135–145)

## 2016-11-21 LAB — CULTURE, BLOOD (ROUTINE X 2): Special Requests: ADEQUATE

## 2016-11-21 LAB — URINE CULTURE: Culture: <10000 — AB

## 2016-11-21 MED ORDER — CEPHALEXIN 500 MG PO CAPS: 500.0000 mg | ORAL_CAPSULE | Freq: Two times a day (BID) | ORAL | 0 refills | Status: AC

## 2016-11-21 MED ORDER — ACETAMINOPHEN 500 MG PO TABS
500.0000 mg | ORAL_TABLET | Freq: Four times a day (QID) | ORAL | 0 refills | Status: AC | PRN
Start: 2016-11-21 — End: ?

## 2016-11-21 NOTE — Discharge Summary (Addendum)
Physician Discharge Summary  Rebecca [name]:[id number] DOB: 1940-08-15 DOA: 11/19/2016  PCP: [name], MD  Admit date: 11/19/2016 Discharge date: 11/21/2016  Admitted From: Home  Disposition:  Home   Recommendations for Outpatient Follow-up:  1. Follow up with PCP in 1- weeks 2. Patient placed on Cephalexin for the next 7 days  Home Health: No  Equipment/Devices: No   Discharge Condition: Stable  CODE STATUS: Full  Diet recommendation: Heart Healthy   Brief/Interim Summary: 76 year old female presented with confusion, dysuria and abnormal blood work. Patient is known to have MUGS, chronic kidney disease stage IV, hypertension, hypothyroidism, COPD, and schizoaffective disorder. Patient was recently seen by his primary care provider, diagnosed with urinary infection and blood work was performed. Patient was called back to come to the ED due to hyponatremia and leukocytosis. On initial physical examination blood pressure 167/89, heart rate 74, respiratory 18, temperature 97.7, oxygen saturation 100%. Moist mucous membranes, lungs were clear auscultation bilaterally, heart S1-S2 present rhythmic, abdomen soft nontender, lower extremities with no edema. Patient was disoriented to time. Sodium 126, potassium 4.8, chloride 91, bicarbonate 24, glucose 143, BUN 25, creatinine 1.48, serum osmolality 269, white count 21.8, troponin 10.5, hematocrit 30.8, platelets 236. Urinalysis with 6-30 white cells. Urine sodium 29 with urine osmolality 168. Chest x-ray was hypoinflated, no infiltrates.  Patient admitted to hospital with a metabolic encephalopathy due to urinary tract infection, complicated by hyponatremia and dehydration.  1. Metabolic encephalopathy. Patient was admitted to the medical floor, she received IV fluids and IV antibiotics with improvement in her symptoms, no further confusion or agitation. He was continued on clozapine and tramadol. Patient was seen by physical therapy, no  further recommendations.  2. Urinary tract infection, present on admission. She was placed on ceftriaxone intravenously, patient responded well to antibiotic therapy, white cell count decreased down to 14.1, she has remained afebrile. Patient's urine culture had less than 10,000 colony-forming units. One of the blood cultures grew coagulase-negative staph suspected to be contaminant. Patient will finish antibiotic therapy with Keflex. Culture from April 2018, Escherichia coli, sensitive to cefazolin. Will need close follow-up as an outpatient.   3. Acute kidney injury on chronic kidney disease stage IV with hyponatremia due to dehydration. Serum sodium was low along with low urinary sodium and low urine osmolality. Patient received isotonic saline with improvement of electrolytes and kidney function. Discharge sodium 139, creatinine 1.18. Potassium 4.2.  4. Hypothyroid. Continue levothyroxine.  5. Schizoaffective disorder. Continue clozapine  6. COPD. Stable with no exacerbation, continue bronchodilator therapy at home. Patient with recurrent episodes of dyspnea and congestion, old records personally reviewed noted spirometry 2013 with FeV1/FVC not reported, will need new PFT. Patient may benefit from inhaled long acting b agonist or anticholinergic.   7. Hypertension and coronary artery disease. Will continue blood pressure control with metoprolol, diltiazem, continue isosorbide and antiplatelet therapy, clopidogrel.  8. Polymyalgia rheumatica. Continue home dose of prednisone.    Discharge Diagnoses:  Principal Problem:   Altered mental status Active Problems:   Hypothyroidism   Dyslipidemia   Anemia in chronic renal disease   Schizoaffective disorder (HCC)   Polymyalgia rheumatica (HCC)   MGUS (monoclonal gammopathy of unknown significance)   Leukocytosis   CKD stage 4, GFR 15-29 ml/min    Coronary artery disease involving native coronary artery of native heart without angina  pectoris   Encephalopathy    Discharge Instructions   Allergies as of 11/21/2016      Reactions   Levaquin [levofloxacin]  Other (See Comments)   Ruptured tendon   Tape Other (See Comments)   PATIENT'S SKIN IS VERY, VERY THIN AND WILL TEAR AND BRUISE EASILY; PLEASE USE AN ALTERNATIVE (SOMETHING OTHER THAN TAPE)      Medication List    STOP taking these medications   amoxicillin-clavulanate 875-125 MG tablet Commonly known as:  AUGMENTIN   aspirin 81 MG EC tablet   HYDROcodone-homatropine 5-1.5 MG/5ML syrup Commonly known as:  HYCODAN     TAKE these medications   acetaminophen 500 MG tablet Commonly known as:  TYLENOL Take 1 tablet (500 mg total) by mouth every 6 (six) hours as needed (pain). What changed:  how much to take   cephALEXin 500 MG capsule Commonly known as:  KEFLEX Take 1 capsule (500 mg total) by mouth 2 (two) times daily.   cetirizine 10 MG tablet Commonly known as:  ZYRTEC Take 10 mg by mouth at bedtime.   clopidogrel 75 MG tablet Commonly known as:  PLAVIX Take 1 tablet (75 mg total) by mouth daily with breakfast. What changed:  when to take this   cloZAPine 100 MG tablet Commonly known as:  CLOZARIL Take 300 mg by mouth at bedtime.   COLACE 100 MG capsule Generic drug:  docusate sodium Take 200 mg by mouth at bedtime.   Cranberry 250 MG Tabs Take 1 tablet by mouth at bedtime.   denosumab 60 MG/ML Soln injection Commonly known as:  PROLIA Inject 60 mg into the skin every 6 (six) months. Administer in upper arm, thigh, or abdomen   diltiazem 180 MG 24 hr tablet Commonly known as:  CARDIZEM LA Take 180 mg by mouth daily with breakfast.   Fluticasone-Salmeterol 250-50 MCG/DOSE Aepb Commonly known as:  ADVAIR Inhale 1 puff into the lungs at bedtime.   guaiFENesin 100 MG/5ML liquid Commonly known as:  ROBITUSSIN Take 400 mg by mouth 3 (three) times daily as needed for cough.   ipratropium-albuterol 0.5-2.5 (3) MG/3ML Soln Commonly  known as:  DUONEB Inhale 3 mLs into the lungs 3 (three) times daily as needed for cough or wheezing.   isosorbide mononitrate 60 MG 24 hr tablet Commonly known as:  IMDUR Take 1 tablet (60 mg total) by mouth daily.   levothyroxine 75 MCG tablet Commonly known as:  SYNTHROID, LEVOTHROID Take 75 mcg by mouth daily before breakfast.   metoprolol succinate 100 MG 24 hr tablet Commonly known as:  TOPROL-XL Take 100 mg by mouth daily with breakfast. Take with or immediately following a meal.   multivitamin with minerals tablet Take 1 tablet by mouth daily.   nitroGLYCERIN 0.4 MG SL tablet Commonly known as:  NITROSTAT Place 1 tablet (0.4 mg total) under the tongue every 5 (five) minutes as needed. For chest pain   pantoprazole 40 MG tablet Commonly known as:  PROTONIX Take 1 tablet (40 mg total) by mouth daily.   polyethylene glycol powder powder Commonly known as:  GLYCOLAX/MIRALAX Take 17 g by mouth daily. MIX AND DRINK   pravastatin 20 MG tablet Commonly known as:  PRAVACHOL Take 20 mg by mouth at bedtime.   predniSONE 10 MG tablet Commonly known as:  DELTASONE Take 1 tablet (10 mg total) by mouth daily with breakfast.   PROBIOTIC PO Take 1 capsule by mouth daily.   SPIRIVA RESPIMAT 2.5 MCG/ACT Aers Generic drug:  Tiotropium Bromide Monohydrate Inhale 2 puffs into the lungs every morning.   traMADol 50 MG tablet Commonly known as:  ULTRAM Take 50-100 mg by  mouth every 6 (six) hours as needed for pain.   traMADol 300 MG 24 hr tablet Commonly known as:  ULTRAM-ER Take 300 mg by mouth every morning.   Vitamin D (Ergocalciferol) 50000 units Caps capsule Commonly known as:  DRISDOL Take 1 capsule (50,000 Units total) by mouth every 7 (seven) days.       Allergies  Allergen Reactions  . Levaquin [Levofloxacin] Other (See Comments)    Ruptured tendon  . Tape Other (See Comments)    PATIENT'S SKIN IS VERY, VERY THIN AND WILL TEAR AND BRUISE EASILY; PLEASE USE AN  ALTERNATIVE (SOMETHING OTHER THAN TAPE)    Consultations:  Procedures/Studies: Dg Chest 2 View  Result Date: 11/19/2016 CLINICAL DATA:  Cough.  Elevated white count.  Previous abnormal CT. EXAM: CHEST  2 VIEW COMPARISON:  CT the chest 10/08/2016. FINDINGS: The heart is mildly enlarged without significant interval change. Mild bibasilar airspace disease is less prominent than on prior exams. There is no edema or effusion. There is no upper lobe consolidation. Advanced degenerative changes are again noted at the shoulders bilaterally. IMPRESSION: 1. Bibasilar airspace disease likely reflects atelectasis. No significant consolidation or aspiration is evident on today's study 2. Mild cardiomegaly without failure. Electronically Signed   By: San Morelle M.D.   On: 11/19/2016 20:07       Subjective: Patient awake and alert, no confusion or agitation, out of bed to chair, with no nausea or vomiting, no dyspnea or chest pain.   Discharge Exam: Vitals:   11/20/16 2133 11/21/16 0542  BP: (!) 147/64 (!) 174/70  Pulse: 68 73  Resp: 18 18  Temp: 98.6 F (37 C) 98.4 F (36.9 C)   Vitals:   11/20/16 2133 11/21/16 0542 11/21/16 0900 11/21/16 0903  BP: (!) 147/64 (!) 174/70    Pulse: 68 73    Resp: 18 18    Temp: 98.6 F (37 C) 98.4 F (36.9 C)    TempSrc: Oral Oral    SpO2: 97% 95% 96% 96%  Weight:      Height:        General: Pt is alert, awake, not in acute distress E ENT. No pallor or icterus, oral mucosa moist.  Cardiovascular: RRR, S1/S2 +, no rubs, no gallops Respiratory: CTA bilaterally,scttered expiratory wheezing, and rhonchi Abdominal: Soft, NT, ND, bowel sounds + Extremities: no edema, no cyanosis    The results of significant diagnostics from this hospitalization (including imaging, microbiology, ancillary and laboratory) are listed below for reference.     Microbiology: Recent Results (from the past 240 hour(s))  Blood culture (routine x 2)     Status: None  (Preliminary result)   Collection Time: 11/19/16  6:20 PM  Result Value Ref Range Status   Specimen Description BLOOD RIGHT ARM  Final   Special Requests   Final    BOTTLES DRAWN AEROBIC AND ANAEROBIC Blood Culture adequate volume   Culture   Final    NO GROWTH < 24 HOURS Performed at [facility], [address], [place], [place] [address]    Report Status PENDING  Incomplete  Blood culture (routine x 2)     Status: Abnormal   Collection Time: 11/19/16  6:25 PM  Result Value Ref Range Status   Specimen Description BLOOD LEFT ANTECUBITAL  Final   Special Requests IN PEDIATRIC BOTTLE Blood Culture adequate volume  Final   Culture  Setup Time   Final    GRAM POSITIVE COCCI IN CLUSTERS IN PEDIATRIC  BOTTLE CRITICAL RESULT CALLED TO, READ BACK BY AND VERIFIED WITH: PHARMD L POINDEXTER [id number] MLM    Culture (A)  Final    STAPHYLOCOCCUS SPECIES (COAGULASE NEGATIVE) THE SIGNIFICANCE OF ISOLATING THIS ORGANISM FROM A SINGLE SET OF BLOOD CULTURES WHEN MULTIPLE SETS ARE DRAWN IS UNCERTAIN. PLEASE NOTIFY THE MICROBIOLOGY DEPARTMENT WITHIN ONE WEEK IF SPECIATION AND SENSITIVITIES ARE REQUIRED. Performed at [facility], [place] [address] [place], [place] [address]    Report Status 11/21/2016 FINAL  Final  Blood Culture ID Panel (Reflexed)     Status: Abnormal   Collection Time: 11/19/16  6:25 PM  Result Value Ref Range Status   Enterococcus species NOT DETECTED NOT DETECTED Final   Listeria monocytogenes NOT DETECTED NOT DETECTED Final   Staphylococcus species DETECTED (A) NOT DETECTED Final    Comment: Methicillin (oxacillin) resistant coagulase negative staphylococcus. Possible blood culture contaminant (unless isolated from more than one blood culture draw or clinical case suggests pathogenicity). No antibiotic treatment is indicated for blood  culture contaminants. CRITICAL RESULT CALLED TO, READ BACK BY AND VERIFIED WITH: PHARMD L POINDEXTER [id number] MLM     Staphylococcus aureus NOT DETECTED NOT DETECTED Final   Methicillin resistance DETECTED (A) NOT DETECTED Final    Comment: CRITICAL RESULT CALLED TO, READ BACK BY AND VERIFIED WITH: PHARMD L POINDEXTER [id number] MLM    Streptococcus species NOT DETECTED NOT DETECTED Final   Streptococcus agalactiae NOT DETECTED NOT DETECTED Final   Streptococcus pneumoniae NOT DETECTED NOT DETECTED Final   Streptococcus pyogenes NOT DETECTED NOT DETECTED Final   Acinetobacter baumannii NOT DETECTED NOT DETECTED Final   Enterobacteriaceae species NOT DETECTED NOT DETECTED Final   Enterobacter cloacae complex NOT DETECTED NOT DETECTED Final   Escherichia coli NOT DETECTED NOT DETECTED Final   Klebsiella oxytoca NOT DETECTED NOT DETECTED Final   Klebsiella pneumoniae NOT DETECTED NOT DETECTED Final   Proteus species NOT DETECTED NOT DETECTED Final   Serratia marcescens NOT DETECTED NOT DETECTED Final   Haemophilus influenzae NOT DETECTED NOT DETECTED Final   Neisseria meningitidis NOT DETECTED NOT DETECTED Final   Pseudomonas aeruginosa NOT DETECTED NOT DETECTED Final   Candida albicans NOT DETECTED NOT DETECTED Final   Candida glabrata NOT DETECTED NOT DETECTED Final   Candida krusei NOT DETECTED NOT DETECTED Final   Candida parapsilosis NOT DETECTED NOT DETECTED Final   Candida tropicalis NOT DETECTED NOT DETECTED Final    Comment: Performed at [facility], [place]. [address] [place], [place] [address]  Urine culture     Status: Abnormal   Collection Time: 11/19/16  8:34 PM  Result Value Ref Range Status   Specimen Description URINE, RANDOM  Final   Special Requests NONE  Final   Culture (A)  Final    <10,000 COLONIES/mL INSIGNIFICANT GROWTH Performed at [facility], [name] [address] [place],  [address]    Report Status 11/21/2016 FINAL  Final     Labs: BNP (last 3 results)  Recent Labs  07/14/16 1525  BNP 196.2*   Basic Metabolic Panel:  Recent Labs Lab  11/19/16 1817 11/20/16 0019 11/20/16 0814 11/21/16 0612  NA 126* 130* 135 139  K 4.8 3.7 4.3 4.2  CL 91* 95* 99* 106  CO2 24 25 27 25   GLUCOSE 143* 153* 79 99  BUN 25* 21* 22* 26*  CREATININE 1.48* 1.34* 1.18* 1.18*  CALCIUM 9.7 8.9 9.2 8.5*   Liver Function Tests:  Recent Labs Lab 11/19/16  1817  AST 18  ALT 14  ALKPHOS 59  BILITOT 0.4  PROT 7.1  ALBUMIN 3.8   No results for input(s): LIPASE, AMYLASE in the last 168 hours. No results for input(s): AMMONIA in the last 168 hours. CBC:  Recent Labs Lab 11/19/16 1817 11/20/16 0814 11/21/16 0612  WBC 21.8* 17.2* 14.1*  NEUTROABS 20.3*  --  9.8*  HGB 10.5* 10.6* 9.4*  HCT 30.8* 31.0* 27.9*  MCV 89.8 89.9 89.4  PLT 236 226 229   Cardiac Enzymes: No results for input(s): CKTOTAL, [name], [name], TROPONINI in the last 168 hours. BNP: Invalid input(s): POCBNP CBG: No results for input(s): GLUCAP in the last 168 hours. D-Dimer No results for input(s): DDIMER in the last 72 hours. Hgb A1c No results for input(s): HGBA1C in the last 72 hours. Lipid Profile No results for input(s): CHOL, [name], [name], [name], [name], LDLDIRECT in the last 72 hours. Thyroid function studies  Recent Labs  11/20/16 0814  TSH 0.703   Anemia work up No results for input(s): VITAMINB12, FOLATE, FERRITIN, TIBC, IRON, RETICCTPCT in the last 72 hours. Urinalysis    Component Value Date/Time   COLORURINE STRAW (A) 11/19/2016 2034   APPEARANCEUR CLEAR 11/19/2016 2034   LABSPEC 1.004 (L) 11/19/2016 2034   LABSPEC 1.015 01/06/2015 1542   PHURINE 6.0 11/19/2016 2034   GLUCOSEU NEGATIVE 11/19/2016 2034   GLUCOSEU Negative 01/06/2015 1542   HGBUR NEGATIVE 11/19/2016 2034   HGBUR negative 12/03/2008 1113   BILIRUBINUR NEGATIVE 11/19/2016 2034   BILIRUBINUR Negative 01/06/2015 1542   KETONESUR NEGATIVE 11/19/2016 2034   PROTEINUR NEGATIVE 11/19/2016 2034   UROBILINOGEN 0.2 01/06/2015 1542   NITRITE NEGATIVE 11/19/2016 2034    LEUKOCYTESUR TRACE (A) 11/19/2016 2034   LEUKOCYTESUR Negative 01/06/2015 1542   Sepsis Labs Invalid input(s): PROCALCITONIN,  WBC,  LACTICIDVEN Microbiology Recent Results (from the past 240 hour(s))  Blood culture (routine x 2)     Status: None (Preliminary result)   Collection Time: 11/19/16  6:20 PM  Result Value Ref Range Status   Specimen Description BLOOD RIGHT ARM  Final   Special Requests   Final    BOTTLES DRAWN AEROBIC AND ANAEROBIC Blood Culture adequate volume   Culture   Final    NO GROWTH < 24 HOURS Performed at [facility], [place] [address], [place], [place] [address]    Report Status PENDING  Incomplete  Blood culture (routine x 2)     Status: Abnormal   Collection Time: 11/19/16  6:25 PM  Result Value Ref Range Status   Specimen Description BLOOD LEFT ANTECUBITAL  Final   Special Requests IN PEDIATRIC BOTTLE Blood Culture adequate volume  Final   Culture  Setup Time   Final    GRAM POSITIVE COCCI IN CLUSTERS IN PEDIATRIC BOTTLE CRITICAL RESULT CALLED TO, READ BACK BY AND VERIFIED WITH: PHARMD L POINDEXTER [id number] MLM    Culture (A)  Final    STAPHYLOCOCCUS SPECIES (COAGULASE NEGATIVE) THE SIGNIFICANCE OF ISOLATING THIS ORGANISM FROM A SINGLE SET OF BLOOD CULTURES WHEN MULTIPLE SETS ARE DRAWN IS UNCERTAIN. PLEASE NOTIFY THE MICROBIOLOGY DEPARTMENT WITHIN ONE WEEK IF SPECIATION AND SENSITIVITIES ARE REQUIRED. Performed at [facility], [place] [address], [place], [place] [address]    Report Status 11/21/2016 FINAL  Final  Blood Culture ID Panel (Reflexed)     Status: Abnormal   Collection Time: 11/19/16  6:25 PM  Result Value Ref Range Status   Enterococcus species NOT DETECTED NOT DETECTED  Final   Listeria monocytogenes NOT DETECTED NOT DETECTED Final   Staphylococcus species DETECTED (A) NOT DETECTED Final    Comment: Methicillin (oxacillin) resistant coagulase negative staphylococcus. Possible blood culture contaminant (unless isolated from more  than one blood culture draw or clinical case suggests pathogenicity). No antibiotic treatment is indicated for blood  culture contaminants. CRITICAL RESULT CALLED TO, READ BACK BY AND VERIFIED WITH: PHARMD L POINDEXTER [id number] MLM    Staphylococcus aureus NOT DETECTED NOT DETECTED Final   Methicillin resistance DETECTED (A) NOT DETECTED Final    Comment: CRITICAL RESULT CALLED TO, READ BACK BY AND VERIFIED WITH: PHARMD L POINDEXTER [id number] MLM    Streptococcus species NOT DETECTED NOT DETECTED Final   Streptococcus agalactiae NOT DETECTED NOT DETECTED Final   Streptococcus pneumoniae NOT DETECTED NOT DETECTED Final   Streptococcus pyogenes NOT DETECTED NOT DETECTED Final   Acinetobacter baumannii NOT DETECTED NOT DETECTED Final   Enterobacteriaceae species NOT DETECTED NOT DETECTED Final   Enterobacter cloacae complex NOT DETECTED NOT DETECTED Final   Escherichia coli NOT DETECTED NOT DETECTED Final   Klebsiella oxytoca NOT DETECTED NOT DETECTED Final   Klebsiella pneumoniae NOT DETECTED NOT DETECTED Final   Proteus species NOT DETECTED NOT DETECTED Final   Serratia marcescens NOT DETECTED NOT DETECTED Final   Haemophilus influenzae NOT DETECTED NOT DETECTED Final   Neisseria meningitidis NOT DETECTED NOT DETECTED Final   Pseudomonas aeruginosa NOT DETECTED NOT DETECTED Final   Candida albicans NOT DETECTED NOT DETECTED Final   Candida glabrata NOT DETECTED NOT DETECTED Final   Candida krusei NOT DETECTED NOT DETECTED Final   Candida parapsilosis NOT DETECTED NOT DETECTED Final   Candida tropicalis NOT DETECTED NOT DETECTED Final    Comment: Performed at [facility], [place]. [address], [place], [place] [address]  Urine culture     Status: Abnormal   Collection Time: 11/19/16  8:34 PM  Result Value Ref Range Status   Specimen Description URINE, RANDOM  Final   Special Requests NONE  Final   Culture (A)  Final    <10,000 COLONIES/mL INSIGNIFICANT GROWTH Performed at  [facility], [place] [address] [place], [place] [address]    Report Status 11/21/2016 FINAL  Final     Time coordinating discharge: 45 minutes  SIGNED:   Tawni Millers, MD  [facility] 11/21/2016, 10:54 AM Pager   If 7PM-7AM, please contact night-coverage [email or web address] Password [email or web address]

## 2016-11-21 NOTE — Progress Notes (Signed)
PT Cancellation Note  Patient Details Name: CANDEE HOON MRN: [id number] DOB: [birth year]   Cancelled Treatment:    Reason Eval/Treat Not Completed: PT screened, no needs identified, (the patient has had not changes sine Evaluation  6/9. PT will sign off. Patient   may ambulate with staff and family.)   Claretha Cooper 11/21/2016, 9:18 AM Tresa Endo PT [phone]

## 2016-11-21 NOTE — Progress Notes (Signed)
Pt discharged to home. DC instructions given to patient and daughter at bedside. Pt and daughter reminded to stop  by pharmacy and pick up med that was e-prescribed by MD. Daughter voiced understanding. Pt Left unit in wheelchair pushed by nurse tech. Left in good condition. No concerns voiced. Rebecca [name].

## 2016-11-24 DIAGNOSIS — N184 Chronic kidney disease, stage 4 (severe): Secondary | ICD-10-CM | POA: Diagnosis not present

## 2016-11-24 DIAGNOSIS — N390 Urinary tract infection, site not specified: Secondary | ICD-10-CM | POA: Diagnosis not present

## 2016-11-24 DIAGNOSIS — M810 Age-related osteoporosis without current pathological fracture: Secondary | ICD-10-CM | POA: Diagnosis not present

## 2016-11-24 DIAGNOSIS — M353 Polymyalgia rheumatica: Secondary | ICD-10-CM | POA: Diagnosis not present

## 2016-11-24 DIAGNOSIS — I129 Hypertensive chronic kidney disease with stage 1 through stage 4 chronic kidney disease, or unspecified chronic kidney disease: Secondary | ICD-10-CM | POA: Diagnosis not present

## 2016-11-24 DIAGNOSIS — E663 Overweight: Secondary | ICD-10-CM | POA: Diagnosis not present

## 2016-11-24 LAB — CULTURE, BLOOD (ROUTINE X 2)
CULTURE: NO GROWTH
Special Requests: ADEQUATE

## 2016-11-25 DIAGNOSIS — N179 Acute kidney failure, unspecified: Secondary | ICD-10-CM | POA: Diagnosis not present

## 2016-11-25 DIAGNOSIS — E1129 Type 2 diabetes mellitus with other diabetic kidney complication: Secondary | ICD-10-CM | POA: Diagnosis not present

## 2016-11-25 DIAGNOSIS — E039 Hypothyroidism, unspecified: Secondary | ICD-10-CM | POA: Diagnosis not present

## 2016-11-25 DIAGNOSIS — E784 Other hyperlipidemia: Secondary | ICD-10-CM | POA: Diagnosis not present

## 2016-11-25 DIAGNOSIS — E119 Type 2 diabetes mellitus without complications: Secondary | ICD-10-CM | POA: Diagnosis not present

## 2016-11-25 DIAGNOSIS — M810 Age-related osteoporosis without current pathological fracture: Secondary | ICD-10-CM | POA: Diagnosis not present

## 2016-11-30 ENCOUNTER — Ambulatory Visit (INDEPENDENT_AMBULATORY_CARE_PROVIDER_SITE_OTHER): Payer: Medicare Other | Admitting: Podiatry

## 2016-11-30 ENCOUNTER — Other Ambulatory Visit: Payer: Self-pay | Admitting: Adult Health

## 2016-11-30 ENCOUNTER — Encounter: Payer: Self-pay | Admitting: Podiatry

## 2016-11-30 DIAGNOSIS — N390 Urinary tract infection, site not specified: Secondary | ICD-10-CM | POA: Diagnosis not present

## 2016-11-30 DIAGNOSIS — L600 Ingrowing nail: Secondary | ICD-10-CM | POA: Diagnosis not present

## 2016-11-30 DIAGNOSIS — Z79899 Other long term (current) drug therapy: Secondary | ICD-10-CM | POA: Diagnosis not present

## 2016-11-30 DIAGNOSIS — I2510 Atherosclerotic heart disease of native coronary artery without angina pectoris: Secondary | ICD-10-CM

## 2016-11-30 MED ORDER — NEOMYCIN-POLYMYXIN-HC 1 % OT SOLN: OTIC | 1 refills | Status: DC

## 2016-11-30 NOTE — Patient Instructions (Signed)

## 2016-12-01 DIAGNOSIS — Z0000 Encounter for general adult medical examination without abnormal findings: Secondary | ICD-10-CM | POA: Diagnosis not present

## 2016-12-01 DIAGNOSIS — I10 Essential (primary) hypertension: Secondary | ICD-10-CM | POA: Diagnosis not present

## 2016-12-01 DIAGNOSIS — E038 Other specified hypothyroidism: Secondary | ICD-10-CM | POA: Diagnosis not present

## 2016-12-01 DIAGNOSIS — Z1389 Encounter for screening for other disorder: Secondary | ICD-10-CM | POA: Diagnosis not present

## 2016-12-01 DIAGNOSIS — N390 Urinary tract infection, site not specified: Secondary | ICD-10-CM | POA: Diagnosis not present

## 2016-12-01 DIAGNOSIS — R300 Dysuria: Secondary | ICD-10-CM | POA: Diagnosis not present

## 2016-12-01 DIAGNOSIS — Z6824 Body mass index (BMI) 24.0-24.9, adult: Secondary | ICD-10-CM | POA: Diagnosis not present

## 2016-12-01 DIAGNOSIS — M810 Age-related osteoporosis without current pathological fracture: Secondary | ICD-10-CM | POA: Diagnosis not present

## 2016-12-01 DIAGNOSIS — D472 Monoclonal gammopathy: Secondary | ICD-10-CM | POA: Diagnosis not present

## 2016-12-01 DIAGNOSIS — I509 Heart failure, unspecified: Secondary | ICD-10-CM | POA: Diagnosis not present

## 2016-12-01 DIAGNOSIS — R918 Other nonspecific abnormal finding of lung field: Secondary | ICD-10-CM | POA: Diagnosis not present

## 2016-12-01 DIAGNOSIS — J449 Chronic obstructive pulmonary disease, unspecified: Secondary | ICD-10-CM | POA: Diagnosis not present

## 2016-12-01 DIAGNOSIS — F258 Other schizoaffective disorders: Secondary | ICD-10-CM | POA: Diagnosis not present

## 2016-12-01 NOTE — Progress Notes (Signed)
She presents today for a chief complaint of a painful ingrown toenail to the hallux left. He states the other toes are long and painful to.  Objective: Pulses are palpable. Sharp incurvated nail margin along the tibial border of the hallux left. Other nails are long thick yellow dystrophic onychomycotic.  Assessment: Pain elicited onychomycosis and ingrown nail hallux left.  Plan: Chemical matrixectomy was performed today as she tolerated local anesthetic without complications. She was given both oral and home-going instructions for care and soaking of his toe as well as a prescription for Cortisporin Otic to be applied twice daily after soaking. I also debrided the remainder of her nails 1 through 5 right and 2 through 5 left. Follow-up with her in 1 week for reevaluation and nail check.

## 2016-12-02 ENCOUNTER — Encounter: Payer: Self-pay | Admitting: Internal Medicine

## 2016-12-02 ENCOUNTER — Ambulatory Visit (INDEPENDENT_AMBULATORY_CARE_PROVIDER_SITE_OTHER): Payer: Medicare Other | Admitting: Internal Medicine

## 2016-12-02 VITALS — BP 183/90 | HR 65 | Ht 63.0 in | Wt 142.8 lb

## 2016-12-02 DIAGNOSIS — I2510 Atherosclerotic heart disease of native coronary artery without angina pectoris: Secondary | ICD-10-CM

## 2016-12-02 DIAGNOSIS — I10 Essential (primary) hypertension: Secondary | ICD-10-CM

## 2016-12-02 DIAGNOSIS — E782 Mixed hyperlipidemia: Secondary | ICD-10-CM

## 2016-12-02 MED ORDER — FUROSEMIDE 20 MG PO TABS: 20.0000 mg | ORAL_TABLET | Freq: Every day | ORAL | 3 refills | Status: DC | PRN

## 2016-12-02 NOTE — Patient Instructions (Addendum)
Your physician has recommended you make the following change in your medication:  -- take lasix 20mg  once daily as needed for swelling/fluid  Your physician wants you to follow-up in: 6 moths with Dr. Debara Pickett. You will receive a reminder letter in the mail two months in advance. If you don't receive a letter, please call our office to schedule the follow-up appointment.

## 2016-12-02 NOTE — Progress Notes (Signed)
12/02/2016   PCP: [name], MD   Chief Complaint  Patient presents with  . Chest Pain    pt states a little   . Follow-up    pt daughter states her face looks a little puffy     Primary Cardiologist: Dr. Debara Pickett  CC: Follow-up from Rebecca Dopp, PA-C  HPI:  [name] presents today with her daughter after developing chest pain, Rt sided yesterday.  Pt had walked- her usual exercise in the morning, later while watching TV she developed Rt side chest pain, the pain then moved to mid sternal area then the rt arm.  Her throat felt strange also.  She had mild nausea.  Her daughter called our office and we instructed her to take NTG sl.  The patient was too afraid.  She took a lozenger that helped her throat.  Pain resolved within 15 min.    She has hx. Of CAD with cath in 2006 and rec'd Taxus DES of the mid RCA.  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  Last lexiscan myoview 09/2012 was low risk scan.  Echo 07/2012 EF 55-60%, mild hypokinesis of inf wall,  Grade 1 diastolic dysfunction.  Other hx as below.  With her negative nuc her Imdur had been d/c'd.  She developed chest pain and it was restarted at 30 mg.  Previously she had been on 60 mg.  At her last office visit, Rebecca [name] started her back on Imdur 60 mg daily.  She now reports her chest pain has resolved.  Her main complaint at this time include ankle pain and some low back pain. She denies any recurrent chest pain. She had recent laboratory work performed at her primary care doctor's office this past week which shows moderate chronic kidney disease with a GFR of 31. No liver enzyme abnormalities. Well controlled cholesterol at total cholesterol 141, triglycerides 114, [name] 51 LDL 67. Hemoglobin A1c 5.5, TSH 1.7. ApoB is 58.  Rebecca [name] follows up today from recent hospitalization where she possibly had a mild TIA. It is not clear however her aspirin was switched over to Plavix. She recently had an episode this past weekend of  right-sided chest discomfort that went up to a neck. She was then complaining of some throat pain. This caused great concern her daughter who state up with her all night but did not note any further symptoms. She did take aspirin with some relief. She's afraid to take nitroglycerin but is on long-acting isosorbide. EKG is normal the office today.  I saw Rebecca [name] back in the office today. Recently she had a pneumonia and was treated with Levaquin. Apparently she had some tendinitis from this. She seems to be recovering though very quickly and is also on stair eyes. She's not had any significant chest pain and has not needed to take short-acting nitrates. Her EKG shows normal sinus rhythm today.  10/24/2015  Rebecca [name] returns today for follow-up. She was recently seen in the office by Rebecca Dopp, PA-C, which time she felt more swollen. He did not suspect that this was congestive heart failure but ordered some labs and Lasix. She never ended up taking the Lasix as her BNP was low and she was noted to be hyponatremic with rising creatinine. He recommended so dietary changes and fluid restriction. A repeat metabolic profile was then improved. She was noted to have some hyperkalemia they adjusted her diet further which her daughter says that has made a  big difference. Rebecca [name] denies any further chest pain. She does note some swelling, particular puffiness of her face which I suspect is related to long-term steroid use.  05/13/2016  Mrs. Barcia returns today for follow-up. She reports feeling fairly stable. She's not had any significant worsening of her edema. She does feel fatigued from time to time. Weight is fairly stable. Overall she seems to be doing fair.  12/02/2016  I saw Rebecca [name] today in follow-up. Unfortunately she's been hospitalized several times for pneumonia following influenza B infection. She continues to be slow to bounce back from that. She's not currently on a diuretic however her daughter is  requesting one. They're contemplating possible trip back to [place] in the summer. It's unclear whether she'll be healthy enough to do that.  Allergies  Allergen Reactions  . Levaquin [Levofloxacin] Other (See Comments)    Ruptured tendon  . Tape Other (See Comments)    PATIENT'S SKIN IS VERY, VERY THIN AND WILL TEAR AND BRUISE EASILY; PLEASE USE AN ALTERNATIVE (SOMETHING OTHER THAN TAPE)    Current Outpatient Prescriptions  Medication Sig Dispense Refill  . acetaminophen (TYLENOL) 500 MG tablet Take 1 tablet (500 mg total) by mouth every 6 (six) hours as needed (pain). 30 tablet 0  . cephALEXin (KEFLEX) 500 MG capsule Take 1 capsule by mouth as directed.  0  . cetirizine (ZYRTEC) 10 MG tablet Take 10 mg by mouth at bedtime.     . clopidogrel (PLAVIX) 75 MG tablet Take 1 tablet (75 mg total) by mouth daily with breakfast. (Patient taking differently: Take 75 mg by mouth at bedtime. ) 30 tablet 0  . cloZAPine (CLOZARIL) 100 MG tablet Take 300 mg by mouth at bedtime.  5  . Cranberry 250 MG TABS Take 1 tablet by mouth at bedtime.    Rebecca [name] denosumab (PROLIA) 60 MG/ML SOLN injection Inject 60 mg into the skin every 6 (six) months. Administer in upper arm, thigh, or abdomen    . diltiazem (CARDIZEM LA) 180 MG 24 hr tablet Take 180 mg by mouth daily with breakfast.  0  . docusate sodium (COLACE) 100 MG capsule Take 200 mg by mouth at bedtime.  10 capsule 0  . Fluticasone-Salmeterol (ADVAIR) 250-50 MCG/DOSE AEPB Inhale 1 puff into the lungs at bedtime.    Rebecca [name] guaiFENesin (ROBITUSSIN) 100 MG/5ML liquid Take 400 mg by mouth 3 (three) times daily as needed for cough.    Rebecca [name] ipratropium-albuterol (DUONEB) 0.5-2.5 (3) MG/3ML SOLN Inhale 3 mLs into the lungs 3 (three) times daily as needed for cough or wheezing.  3  . isosorbide mononitrate (IMDUR) 60 MG 24 hr tablet Take 1 tablet (60 mg total) by mouth daily. 30 tablet 6  . levothyroxine (SYNTHROID, LEVOTHROID) 75 MCG tablet Take 75 mcg by mouth daily before  breakfast.    . metoprolol succinate (TOPROL-XL) 100 MG 24 hr tablet Take 100 mg by mouth daily with breakfast. Take with or immediately following a meal.    . Multiple Vitamins-Minerals (MULTIVITAMIN WITH MINERALS) tablet Take 1 tablet by mouth daily.    . NEOMYCIN-POLYMYXIN-HYDROCORTISONE (CORTISPORIN) 1 % SOLN OTIC solution Apply 1-2 drops to toe BID after soaking 10 mL 1  . nitroGLYCERIN (NITROSTAT) 0.4 MG SL tablet Place 1 tablet (0.4 mg total) under the tongue every 5 (five) minutes as needed. For chest pain 25 tablet 3  . pantoprazole (PROTONIX) 40 MG tablet Take 1 tablet (40 mg total) by mouth daily. 30 tablet 11  . polyethylene glycol  powder (GLYCOLAX/MIRALAX) powder Take 17 g by mouth daily. [place]    . pravastatin (PRAVACHOL) 20 MG tablet Take 20 mg by mouth at bedtime.    . predniSONE (DELTASONE) 10 MG tablet Take 1 tablet (10 mg total) by mouth daily with breakfast. 30 tablet 0  . Probiotic Product (PROBIOTIC PO) Take 1 capsule by mouth daily.    . Tiotropium Bromide Monohydrate (SPIRIVA RESPIMAT) 2.5 MCG/ACT AERS Inhale 2 puffs into the lungs every morning.     . traMADol (ULTRAM) 50 MG tablet Take 50-100 mg by mouth every 6 (six) hours as needed for pain.   0  . traMADol (ULTRAM-ER) 300 MG 24 hr tablet Take 300 mg by mouth every morning.  0  . Vitamin D, Ergocalciferol, (DRISDOL) 50000 UNITS CAPS capsule Take 1 capsule (50,000 Units total) by mouth every 7 (seven) days. 4 capsule 0  . furosemide (LASIX) 20 MG tablet Take 1 tablet (20 mg total) by mouth daily as needed. 30 tablet 3   No current facility-administered medications for this visit.     Past Medical History:  Diagnosis Date  . AKI (acute kidney injury) ([place])   . Anemia in chronic renal disease   . CAD (coronary artery disease) 2006   Taxus stent of mid RCA. negative myoview 2014 and normal LV function  . CKD (chronic kidney disease) stage 4, GFR 15-29 ml/min ([facility])   . Complication of anesthesia   . COPD  (chronic obstructive pulmonary disease) ([place])   . Diabetes mellitus   . Diastolic heart failure, NYHA class 1 ([place])   . Dyslipidemia   . Dysuria 01/06/2015  . Frozen shoulder    right  . Gastroesophageal reflux   . HAP (hospital-acquired pneumonia)   . Headache   . Hyperkalemia 01/01/2016  . Hypothyroidism   . 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 Obesity   . Pneumonia 06/2016  . Polymyalgia rheumatica ([place])   . PONV (postoperative nausea and vomiting)   . Renal insufficiency   . Respiratory distress   . Schizoaffective disorder ([place])   . Sepsis due to pneumonia ([place])   . Shortness of breath   . Stroke [facility])     Past Surgical History:  Procedure Laterality Date  . ABDOMINAL HYSTERECTOMY    . CARDIAC CATHETERIZATION N/A 08/13/2015   Procedure: Left Heart Cath and Coronary Angiography;  Surgeon: Jettie Booze, MD;  Location: [facility];  Service: Cardiovascular;  Laterality: N/A;  . CATARACT EXTRACTION    . CHOLECYSTECTOMY    . CORONARY ANGIOPLASTY WITH STENT PLACEMENT  2006   Taxus DES to RCA, 50 % residual  . PARTIAL HYSTERECTOMY  1995    ROS: Pertinent items noted in HPI and remainder of comprehensive ROS otherwise negative.  PHYSICAL EXAM BP (!) 183/90   Pulse 65   Ht 5\' 3"  (1.6 m)   Wt 142 lb 12.8 oz (64.8 kg)   SpO2 97%   BMI 25.30 kg/m  General appearance: alert and no distress Neck: no carotid bruit and no JVD Lungs: clear to auscultation bilaterally Heart: regular rate and rhythm, S1, S2 normal, no murmur, click, rub or gallop Abdomen: soft, non-tender; bowel sounds normal; no masses,  no organomegaly Extremities: extremities normal, atraumatic, no cyanosis or edema Pulses: 2+ and symmetric Skin: Skin color, texture, turgor normal. No rashes or lesions Neurologic: Grossly normal Psych: Pleasant   EKG: Deferred  ASSESSMENT AND PLAN Patient Active Problem List   Diagnosis Date  Noted  . Encephalopathy  11/20/2016  . Altered mental status 11/19/2016  . Aspiration pneumonia of both lower lobes due to gastric secretions ([place]) 10/10/2016  . Pneumonia 10/07/2016  . Lobar pneumonia, unspecified organism ([place]) 10/07/2016  . Ileus ([place]) 10/07/2016  . Healthcare-associated pneumonia 09/12/2016  . AKI (acute kidney injury) ([place]) 09/12/2016  . HAP (hospital-acquired pneumonia) 09/12/2016  . Respiratory distress 09/12/2016  . Pressure injury of skin 07/15/2016  . Benign essential HTN 07/14/2016  . Acute encephalopathy 07/14/2016  . Coronary artery disease involving native coronary artery of native heart without angina pectoris 05/14/2016  . CKD stage 4, GFR 15-29 ml/min  08/11/2015  . 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 Leukocytosis 01/28/2014  . History of TIA - May 2015- Plavix 10/16/2013  . COPD exacerbation ([place]) 07/16/2013  . Polymyalgia rheumatica ([place]) 10/03/2012  . Trochanteric bursitis of right hip 04/11/2012  . History of small bowel obstruction 11/04/2010  . Schizoaffective disorder ([place]) 01/10/2008  . Dyslipidemia 01/25/2007  . Hypothyroidism 08/11/2006  . Anemia in chronic renal disease 08/11/2006  . GASTROESOPHAGEAL REFLUX, NO ESOPHAGITIS 08/11/2006   PLAN: 1.  Ms. [name] Unfortunate has been hospitalized numerous times for recurrent pneumonia. Her daughter reports that which stops antibiotic she seems to have recurrence. I wonder she may have a resistant organism or could benefit from either pulmonary or infectious disease consultations. They're requesting a diuretic although she has not needed one recently and appears euvolemic on exam today I'll provide that to use as needed. Follow-up with me in 6 months.  Pixie Casino, MD, [facility] Attending Cardiologist [place]

## 2016-12-03 ENCOUNTER — Other Ambulatory Visit: Payer: Self-pay | Admitting: Internal Medicine

## 2016-12-03 DIAGNOSIS — R911 Solitary pulmonary nodule: Secondary | ICD-10-CM

## 2016-12-07 ENCOUNTER — Ambulatory Visit: Payer: Medicare Other | Admitting: Podiatry

## 2016-12-07 DIAGNOSIS — R3914 Feeling of incomplete bladder emptying: Secondary | ICD-10-CM | POA: Diagnosis not present

## 2016-12-07 DIAGNOSIS — N3946 Mixed incontinence: Secondary | ICD-10-CM | POA: Diagnosis not present

## 2016-12-07 DIAGNOSIS — N3020 Other chronic cystitis without hematuria: Secondary | ICD-10-CM | POA: Diagnosis not present

## 2016-12-07 DIAGNOSIS — R300 Dysuria: Secondary | ICD-10-CM | POA: Diagnosis not present

## 2016-12-08 ENCOUNTER — Ambulatory Visit
Admission: RE | Admit: 2016-12-08 | Discharge: 2016-12-08 | Disposition: A | Discharge status: Home / Self Care | Payer: Medicare Other | Source: Ambulatory Visit | Attending: Internal Medicine | Admitting: Internal Medicine

## 2016-12-08 DIAGNOSIS — R911 Solitary pulmonary nodule: Secondary | ICD-10-CM | POA: Diagnosis not present

## 2016-12-10 DIAGNOSIS — N390 Urinary tract infection, site not specified: Secondary | ICD-10-CM | POA: Diagnosis not present

## 2016-12-10 DIAGNOSIS — R5381 Other malaise: Secondary | ICD-10-CM | POA: Diagnosis not present

## 2016-12-10 DIAGNOSIS — R159 Full incontinence of feces: Secondary | ICD-10-CM | POA: Diagnosis not present

## 2016-12-10 DIAGNOSIS — R300 Dysuria: Secondary | ICD-10-CM | POA: Diagnosis not present

## 2016-12-10 DIAGNOSIS — Z6825 Body mass index (BMI) 25.0-25.9, adult: Secondary | ICD-10-CM | POA: Diagnosis not present

## 2016-12-10 DIAGNOSIS — N3946 Mixed incontinence: Secondary | ICD-10-CM | POA: Diagnosis not present

## 2016-12-22 DIAGNOSIS — N3020 Other chronic cystitis without hematuria: Secondary | ICD-10-CM | POA: Diagnosis not present

## 2016-12-30 ENCOUNTER — Ambulatory Visit (INDEPENDENT_AMBULATORY_CARE_PROVIDER_SITE_OTHER): Payer: Self-pay | Admitting: Podiatry

## 2016-12-30 ENCOUNTER — Ambulatory Visit (INDEPENDENT_AMBULATORY_CARE_PROVIDER_SITE_OTHER): Payer: Medicare Other | Admitting: Orthotics

## 2016-12-30 ENCOUNTER — Encounter: Payer: Self-pay | Admitting: Podiatry

## 2016-12-30 DIAGNOSIS — E1149 Type 2 diabetes mellitus with other diabetic neurological complication: Secondary | ICD-10-CM

## 2016-12-30 DIAGNOSIS — M2042 Other hammer toe(s) (acquired), left foot: Secondary | ICD-10-CM

## 2016-12-30 DIAGNOSIS — M2041 Other hammer toe(s) (acquired), right foot: Secondary | ICD-10-CM | POA: Diagnosis not present

## 2016-12-30 DIAGNOSIS — Q6650 Congenital pes planus, unspecified foot: Secondary | ICD-10-CM | POA: Diagnosis not present

## 2016-12-30 DIAGNOSIS — L600 Ingrowing nail: Secondary | ICD-10-CM

## 2016-12-30 NOTE — Progress Notes (Signed)

## 2016-12-30 NOTE — Progress Notes (Signed)
She presents today to pick up her diabetic shoes. She's also follow-up of onychomycosis. An matrixectomy.  Objective: Vital signs are stable she is alert and oriented 3 no erythema edema cellulitis drainage or odor. Pulses are palpable. Wound is gone he'll eventually hallux left tibial border.  Assessment: Diabetes mellitus with matrixectomy well-healed.  Plan: Follow up with me on an as-needed basis diabetic shoes were dispensed today and I will follow-up with her as needed.

## 2016-12-31 DIAGNOSIS — Z79899 Other long term (current) drug therapy: Secondary | ICD-10-CM | POA: Diagnosis not present

## 2017-01-04 ENCOUNTER — Ambulatory Visit: Payer: Medicare Other | Admitting: Hematology and Oncology

## 2017-01-04 ENCOUNTER — Other Ambulatory Visit: Payer: Medicare Other

## 2017-01-06 DIAGNOSIS — R8271 Bacteriuria: Secondary | ICD-10-CM | POA: Diagnosis not present

## 2017-01-06 DIAGNOSIS — N3020 Other chronic cystitis without hematuria: Secondary | ICD-10-CM | POA: Diagnosis not present

## 2017-01-09 ENCOUNTER — Encounter: Payer: Self-pay | Admitting: Student

## 2017-01-09 NOTE — Progress Notes (Signed)
Cardiology Office Note    Date:  01/11/2017   ID:  Rebecca [name], DOB 04-16-1941, MRN [id number]  PCP:  [name], MD  Cardiologist: Dr. Debara Pickett   Chief Complaint  Patient presents with  . Follow-up    Fatigue and Chest Pain    History of Present Illness:    Rebecca [name] is a 76 y.o. female with past medical history of CAD (s/p DES to RCA in 2006, low-risk NST in 2014, cath in 08/2015 showing patent RCA stent with nonobstructive disease), chronic diastolic CHF, HTN, HLD, Stage 3 CKD, MGUS, PMR and prior CVA who presents to the office today for evaluation of fatigue.   She was examined by Dr. Debara Pickett in 11/2016 and reported frequent hospitalizations for PNA and Influenza. At the time of her visit, she denied any recent symptoms. Her daughter requested that she have a PRN diuretic, therefore Lasix 20mg  PRN was prescribed.  In talking with the patient today, she is accompanied by her daughter who provides most of the history. Two weeks ago, she developed left-sided chest pain which she describes as a shooting sensation. She took 2 SL NTG which resolved her pain. Notes associated diaphoresis. She had a recurrent episode of pain two days ago which only lasted for seconds and occurred at rest. She denies any recent dyspnea on exertion, exertional chest pain, palpitations, orthopnea, PND, or lower extremity edema.   She is being followed by [facility] for recurrent UTI's. Her daughter is concerned these are making her feel weak and are not effective in treating her. She has experienced episodes of AMS which occurs with her UTI's.    She does have a chronic anemia which is followed by [name]. She brings with her recent lab results from 7/20 which showed a WBC count of 18.6 with Hgb of 9.7 (close to baseline in dating back to 2012).   Past Medical History:  Diagnosis Date  . AKI (acute kidney injury) ([place])   . Anemia in chronic renal disease   . CAD (coronary artery disease) 2006     a. s/p DES to RCA in 2006 b. low-risk NST in 2014 c. cath in 08/2015 showing patent RCA stent with nonobstructive disease  . CKD (chronic kidney disease) stage 4, GFR 15-29 ml/min (H[facility])   . Complication of anesthesia   . COPD (chronic obstructive pulmonary disease) ([place])   . Diabetes mellitus   . Diastolic heart failure, NYHA class 1 ([place])   . Dyslipidemia   . Dysuria 01/06/2015  . Frozen shoulder    right  . Gastroesophageal reflux   . HAP (hospital-acquired pneumonia)   . Headache   . Hyperkalemia 01/01/2016  . Hypothyroidism   . 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 Obesity   . Pneumonia 06/2016  . Polymyalgia rheumatica ([place])   . PONV (postoperative nausea and vomiting)   . Renal insufficiency   . Respiratory distress   . Schizoaffective disorder ([place])   . Sepsis due to pneumonia ([place])   . Shortness of breath   . Stroke [facility])     Past Surgical History:  Procedure Laterality Date  . ABDOMINAL HYSTERECTOMY    . CARDIAC CATHETERIZATION N/A 08/13/2015   Procedure: Left Heart Cath and Coronary Angiography;  Surgeon: Jettie Booze, MD;  Location: [place];  Service: Cardiovascular;  Laterality: N/A;  . CATARACT EXTRACTION    . CHOLECYSTECTOMY    . CORONARY ANGIOPLASTY WITH STENT PLACEMENT  2006  [place] DES to RCA, 50 % residual  . PARTIAL HYSTERECTOMY  1995    Current Medications: Outpatient Medications Prior to Visit  Medication Sig Dispense Refill  . acetaminophen (TYLENOL) 500 MG tablet Take 1 tablet (500 mg total) by mouth every 6 (six) hours as needed (pain). 30 tablet 0  . cetirizine (ZYRTEC) 10 MG tablet Take 10 mg by mouth at bedtime.     . clopidogrel (PLAVIX) 75 MG tablet Take 1 tablet (75 mg total) by mouth daily with breakfast. (Patient taking differently: Take 75 mg by mouth at bedtime. ) 30 tablet 0  . cloZAPine (CLOZARIL) 100 MG tablet Take 300 mg by mouth at bedtime.  5  . Cranberry 250 MG TABS Take 1 tablet by  mouth at bedtime.    Marland Kitchen denosumab (PROLIA) 60 MG/ML SOLN injection Inject 60 mg into the skin every 6 (six) months. Administer in upper arm, thigh, or abdomen    . diltiazem (CARDIZEM LA) 180 MG 24 hr tablet Take 180 mg by mouth daily with breakfast.  0  . docusate sodium (COLACE) 100 MG capsule Take 200 mg by mouth at bedtime.  10 capsule 0  . Fluticasone-Salmeterol (ADVAIR) 250-50 MCG/DOSE AEPB Inhale 1 puff into the lungs at bedtime.    . furosemide (LASIX) 20 MG tablet Take 1 tablet (20 mg total) by mouth daily as needed. 30 tablet 3  . guaiFENesin (ROBITUSSIN) 100 MG/5ML liquid Take 400 mg by mouth 3 (three) times daily as needed for cough.    Marland Kitchen ipratropium-albuterol (DUONEB) 0.5-2.5 (3) MG/3ML SOLN Inhale 3 mLs into the lungs 3 (three) times daily as needed for cough or wheezing.  3  . isosorbide mononitrate (IMDUR) 60 MG 24 hr tablet Take 1 tablet (60 mg total) by mouth daily. 30 tablet 6  . levothyroxine (SYNTHROID, LEVOTHROID) 75 MCG tablet Take 75 mcg by mouth daily before breakfast.    . metoprolol succinate (TOPROL-XL) 100 MG 24 hr tablet Take 100 mg by mouth daily with breakfast. Take with or immediately following a meal.    . Multiple Vitamins-Minerals (MULTIVITAMIN WITH MINERALS) tablet Take 1 tablet by mouth daily.    . NEOMYCIN-POLYMYXIN-HYDROCORTISONE (CORTISPORIN) 1 % SOLN OTIC solution Apply 1-2 drops to toe BID after soaking 10 mL 1  . pantoprazole (PROTONIX) 40 MG tablet Take 1 tablet (40 mg total) by mouth daily. 30 tablet 11  . polyethylene glycol powder (GLYCOLAX/MIRALAX) powder Take 17 g by mouth daily. [place]    . pravastatin (PRAVACHOL) 20 MG tablet Take 20 mg by mouth at bedtime.    . predniSONE (DELTASONE) 10 MG tablet Take 1 tablet (10 mg total) by mouth daily with breakfast. 30 tablet 0  . Probiotic Product (PROBIOTIC PO) Take 1 capsule by mouth daily.    . Tiotropium Bromide Monohydrate (SPIRIVA RESPIMAT) 2.5 MCG/ACT AERS Inhale 2 puffs into the lungs every  morning.     . traMADol (ULTRAM) 50 MG tablet Take 50-100 mg by mouth every 6 (six) hours as needed for pain.   0  . traMADol (ULTRAM-ER) 300 MG 24 hr tablet Take 300 mg by mouth every morning.  0  . Vitamin D, Ergocalciferol, (DRISDOL) 50000 UNITS CAPS capsule Take 1 capsule (50,000 Units total) by mouth every 7 (seven) days. 4 capsule 0  . nitroGLYCERIN (NITROSTAT) 0.4 MG SL tablet Place 1 tablet (0.4 mg total) under the tongue every 5 (five) minutes as needed. For chest pain 25 tablet 3   No facility-administered medications prior  to visit.      Allergies:   Levaquin [levofloxacin] and Tape   Social History   Social History  . Marital status: Divorced    Spouse name: N/A  . Number of children: 3  . Years of education: 9th   Occupational History  . homemaker    Social History Main Topics  . Smoking status: Former Smoker    Packs/day: 1.00    Years: 35.00    Quit date: 04/14/2004  . Smokeless tobacco: Never Used  . Alcohol use No  . Drug use: No  . Sexual activity: No   Other Topics Concern  . None   Social History Narrative   Daughter [name] and son are very involved.     [name] comes to most office visits.   She now lives at home with daughter [name]   Daughter is HCPOA, not averse to SNF/rehab in short-term, but does not wish pt to return to [facility] and requests that pt/pt's care not be discussed by [facility] providers to [facility], moving forward.   Caffeine Use: 1-3 cups daily     Family History:  The patient's family history includes Arthritis in her father; Cancer in her brother, cousin, daughter, and maternal uncle; Diabetes in her brother, brother, brother, and mother; Healthy in her brother, brother, brother, and sister; Heart disease in her brother, brother, brother, father, and mother; Osteoporosis in her sister.   Review of Systems:   Please see the history of present illness.     General:  No chills, fever, night sweats or weight changes.  Positive for fatigue.  Cardiovascular:  No dyspnea on exertion, edema, orthopnea, palpitations, paroxysmal nocturnal dyspnea. Positive for chest pain.  Dermatological: No rash, lesions/masses Respiratory: No cough, dyspnea Urologic: No hematuria, dysuria Abdominal:   No nausea, vomiting, diarrhea, bright red blood per rectum, melena, or hematemesis Neurologic:  No visual changes, wkns, changes in mental status. All other systems reviewed and are otherwise negative except as noted above.   Physical Exam:    VS:  BP 138/82   Pulse 67   Ht 5\' 3"  (1.6 m)   Wt 139 lb 9.6 oz (63.3 kg)   SpO2 98%   BMI 24.73 kg/m    General: Well developed, well nourished Caucasian female appearing in no acute distress. Head: Normocephalic, atraumatic, sclera non-icteric, no xanthomas, nares are without discharge.  Neck: No carotid bruits. JVD not elevated.  Lungs: Respirations regular and unlabored, without wheezes or rales.  Heart: Regular rate and rhythm. No S3 or S4.  No murmur, no rubs, or gallops appreciated. Abdomen: Soft, non-tender, non-distended with normoactive bowel sounds. No hepatomegaly. No rebound/guarding. No obvious abdominal masses. Msk:  Strength and tone appear normal for age. No joint deformities or effusions. Extremities: No clubbing or cyanosis. No lower extremity edema.  Distal pedal pulses are 2+ bilaterally. Neuro: Alert and oriented X 3. Moves all extremities spontaneously. No focal deficits noted. Psych:  Responds to questions appropriately with a normal affect. Skin: No rashes or lesions noted  Wt Readings from Last 3 Encounters:  01/11/17 139 lb 9.6 oz (63.3 kg)  12/02/16 142 lb 12.8 oz (64.8 kg)  11/19/16 137 lb (62.1 kg)     Studies/Labs Reviewed:   EKG:  EKG is ordered today.  The ekg ordered today demonstrates NSR, HR 67, with no acute ST or T-wave changes when compared to prior tracings.    Recent Labs: 07/14/2016: B Natriuretic Peptide 159.5 11/19/2016: ALT  14 11/20/2016: TSH  0.703 11/21/2016: BUN 26; Creatinine, Ser 1.18; Hemoglobin 9.4; Platelets 229; Potassium 4.2; Sodium 139   Lipid Panel    Component Value Date/Time   CHOL 122 10/17/2013 0513   [name] 80 10/17/2013 0513   [name] 65 10/17/2013 0513   [name] 1.9 10/17/2013 0513   VLDL 16 10/17/2013 0513   [name] 41 10/17/2013 0513   LDLDIRECT 88 09/05/2007 2043    Additional studies/ records that were reviewed today include:   Cardiac Catheterization: 08/13/2015  Patent RCA stent.  Mild, nonobstructive disease in the coronary arteries.  Mildly elevated LVEDP.   Continue hydration due to her renal insufficiency.  Continue aggressive secondary prevention.   Echocardiogram: 07/2015 Study Conclusions  - Left ventricle: The cavity size was normal. There was mild focal   basal hypertrophy of the septum. Systolic function was normal.   The estimated ejection fraction was in the range of 60% to 65%.   Wall motion was normal; there were no regional wall motion   abnormalities. - Left atrium: The atrium was mildly dilated. - Atrial septum: No defect or patent foramen ovale was identified.  Assessment:    1. Coronary artery disease involving native coronary artery of native heart with other form of angina pectoris ([place])   2. Chronic diastolic (congestive) heart failure ([place])   3. Essential hypertension   4. Medication management   5. Chronic fatigue   6. CKD (chronic kidney disease) stage 3, GFR 30-59 ml/min      Plan:   In order of problems listed above:  1. CAD - she had known CAD, s/p DES to RCA in 2006 with low-risk NST in 2014 and recent cath in 08/2015 showing patent RCA stent with nonobstructive disease along other vessels.  - she reports 2 episodes of chest pain within the past two weeks. The patient is a poor historian but her first episode occurred at rest and lasted for 10 minutes with a recurrent event 2 days ago which only lasted for a few seconds. - EKG today  shows NSR with no acute ST or T-wave changes. On exam, her left pectoral region is tender to palpation. - with her recent cath last year and atypical symptoms (only lasting for seconds at a time and with her chest being tender to palpation) would not pursue further ischemic evaluation at this time. She is currently on Imdur 60mg  daily. Discussed with the patient's daughter that we could further titrate her Imdur to 90mg  daily but she wished to pursue watchful waiting at this time to see if she has recurrent symptoms which certainly seems reasonable.  - continue Plavix, BB, and statin therapy.   2. Chronic Diastolic CHF - echo in 78/6767 showed a preserved EF of 60-65%. - she does not appear volume overloaded by physical examination. Weights have been stable. - continue Lasix 20mg  daily PRN for edema.   3. HTN - BP is well-controlled at 138/83 during today's visit. - continue Cardizem CD 180mg  daily, Imdur 60mg  daily, and Toprol-XL 100mg  daily.   4. Chronic Fatigue - likely multifactorial in the setting of her chronic anemia and recurrent UTI's.  - will recheck CBC today to make sure Hgb is stable. Will forward results to [name] per the patient's request.   5. Stage 3 CKD - creatinine stable at 1.18 in 11/2016. Will recheck creatinine today in the setting of recurrent UTI's.    Medication Adjustments/Labs and Tests Ordered: Current medicines are reviewed at length with the patient today.  Concerns regarding  medicines are outlined above.  Medication changes, Labs and Tests ordered today are listed in the Patient Instructions below. Patient Instructions  Medication Instructions: Your physician recommends that you continue on your current medications as directed. Please refer to the Current Medication list given to you today.  If you need a refill on your cardiac medications before your next appointment, please call your pharmacy.   Labwork: Your physician recommends that you have the  following today: BMET and CBC   Follow-Up: Your physician wants you to follow-up in: 6 months with Dr. Debara Pickett. You will receive a reminder letter in the mail two months in advance. If you don't receive a letter, please call our office to schedule this follow-up appointment.   Thank you for choosing Heartcare at [facility]!!     Signed, Erma Heritage, PA-C  01/11/2017 8:01 PM    [address], [place], [place]  [address] Phone: [phone]; Fax: [phone]  [address], [place], [place] [address] Phone: [phone]

## 2017-01-11 ENCOUNTER — Other Ambulatory Visit: Payer: Self-pay | Admitting: Student

## 2017-01-11 ENCOUNTER — Ambulatory Visit (INDEPENDENT_AMBULATORY_CARE_PROVIDER_SITE_OTHER): Payer: Medicare Other | Admitting: Student

## 2017-01-11 ENCOUNTER — Encounter: Payer: Self-pay | Admitting: Student

## 2017-01-11 VITALS — BP 138/82 | HR 67 | Ht 63.0 in | Wt 139.6 lb

## 2017-01-11 DIAGNOSIS — N183 Chronic kidney disease, stage 3 unspecified: Secondary | ICD-10-CM

## 2017-01-11 DIAGNOSIS — Z79899 Other long term (current) drug therapy: Secondary | ICD-10-CM | POA: Diagnosis not present

## 2017-01-11 DIAGNOSIS — I5032 Chronic diastolic (congestive) heart failure: Secondary | ICD-10-CM | POA: Diagnosis not present

## 2017-01-11 DIAGNOSIS — I10 Essential (primary) hypertension: Secondary | ICD-10-CM

## 2017-01-11 DIAGNOSIS — I2510 Atherosclerotic heart disease of native coronary artery without angina pectoris: Secondary | ICD-10-CM | POA: Diagnosis not present

## 2017-01-11 DIAGNOSIS — R5382 Chronic fatigue, unspecified: Secondary | ICD-10-CM | POA: Diagnosis not present

## 2017-01-11 DIAGNOSIS — I25118 Atherosclerotic heart disease of native coronary artery with other forms of angina pectoris: Secondary | ICD-10-CM | POA: Diagnosis not present

## 2017-01-11 DIAGNOSIS — I209 Angina pectoris, unspecified: Secondary | ICD-10-CM

## 2017-01-11 DIAGNOSIS — R5383 Other fatigue: Secondary | ICD-10-CM | POA: Insufficient documentation

## 2017-01-11 MED ORDER — NITROGLYCERIN 0.4 MG SL SUBL: 0.4000 mg | SUBLINGUAL_TABLET | SUBLINGUAL | 3 refills | Status: AC | PRN

## 2017-01-11 NOTE — Telephone Encounter (Signed)
This is Dr. Hilty's pt 

## 2017-01-11 NOTE — Patient Instructions (Signed)
Medication Instructions: Your physician recommends that you continue on your current medications as directed. Please refer to the Current Medication list given to you today.  If you need a refill on your cardiac medications before your next appointment, please call your pharmacy.   Labwork: Your physician recommends that you have the following today: BMET and CBC   Follow-Up: Your physician wants you to follow-up in: 6 months with Dr. Debara Pickett. You will receive a reminder letter in the mail two months in advance. If you don't receive a letter, please call our office to schedule this follow-up appointment.   Thank you for choosing Heartcare at [facility]!!

## 2017-01-12 ENCOUNTER — Telehealth: Payer: Self-pay | Admitting: *Deleted

## 2017-01-12 DIAGNOSIS — Z79899 Other long term (current) drug therapy: Secondary | ICD-10-CM

## 2017-01-12 LAB — BASIC METABOLIC PANEL WITH GFR
BUN/Creatinine Ratio: 23 (ref 12–28)
BUN: 37 mg/dL — ABNORMAL HIGH (ref 8–27)
CO2: 24 mmol/L (ref 20–29)
Calcium: 10.3 mg/dL (ref 8.7–10.3)
Chloride: 95 mmol/L — ABNORMAL LOW (ref 96–106)
Creatinine, Ser: 1.59 mg/dL — ABNORMAL HIGH (ref 0.57–1.00)
GFR calc Af Amer: 36 mL/min/{1.73_m2} — ABNORMAL LOW
GFR calc non Af Amer: 31 mL/min/{1.73_m2} — ABNORMAL LOW
Glucose: 145 mg/dL — ABNORMAL HIGH (ref 65–99)
Potassium: 5.9 mmol/L — ABNORMAL HIGH (ref 3.5–5.2)
Sodium: 135 mmol/L (ref 134–144)

## 2017-01-12 LAB — CBC
Hematocrit: 34.5 % (ref 34.0–46.6)
Hemoglobin: 11.1 g/dL (ref 11.1–15.9)
MCH: 30 pg (ref 26.6–33.0)
MCHC: 32.2 g/dL (ref 31.5–35.7)
MCV: 93 fL (ref 79–97)
Platelets: 254 10*3/uL (ref 150–379)
RBC: 3.7 x10E6/uL — ABNORMAL LOW (ref 3.77–5.28)
RDW: 13.8 % (ref 12.3–15.4)
WBC: 16.8 10*3/uL — ABNORMAL HIGH (ref 3.4–10.8)

## 2017-01-12 MED ORDER — SODIUM POLYSTYRENE SULFONATE 15 GM/60ML PO SUSP: 15.0000 g | Freq: Once | ORAL | 0 refills | Status: AC

## 2017-01-12 NOTE — Telephone Encounter (Signed)
-----   Message from Erma Heritage, [place] sent at 01/12/2017  7:55 AM EDT ----- Please let the patient/her daughter know that kidney function is mildly elevated as expected in the setting of her recurrent UTI's. Recommend adequate hydration (1.5-2L of fluid per day) and avoidance of NSAIDS. K+ is elevated (I do not see where she is taking any K+ supplementation, but please verify this with her). If not taking K+ supplementation, please provide with Rx for Kayexalate 15g for one dose. Will need a repeat BMET within 1 week to make sure this has trended down. WBC count has improved (18.6 on 7/20, now at 16.8). Hgb is higher than it has previously been at 11.1. Please forward results to [name]. Thank you.

## 2017-01-16 ENCOUNTER — Emergency Department (HOSPITAL_COMMUNITY)
Admission: EM | Admit: 2017-01-16 | Discharge: 2017-01-17 | Disposition: A | Discharge status: Home / Self Care | Payer: Medicare Other | Attending: Emergency Medicine | Admitting: Emergency Medicine

## 2017-01-16 ENCOUNTER — Emergency Department (HOSPITAL_COMMUNITY): Payer: Medicare Other

## 2017-01-16 ENCOUNTER — Encounter (HOSPITAL_COMMUNITY): Payer: Self-pay | Admitting: Nurse Practitioner

## 2017-01-16 DIAGNOSIS — Z79899 Other long term (current) drug therapy: Secondary | ICD-10-CM | POA: Diagnosis not present

## 2017-01-16 DIAGNOSIS — J449 Chronic obstructive pulmonary disease, unspecified: Secondary | ICD-10-CM | POA: Diagnosis not present

## 2017-01-16 DIAGNOSIS — R1084 Generalized abdominal pain: Secondary | ICD-10-CM | POA: Diagnosis not present

## 2017-01-16 DIAGNOSIS — R109 Unspecified abdominal pain: Secondary | ICD-10-CM | POA: Diagnosis not present

## 2017-01-16 DIAGNOSIS — I130 Hypertensive heart and chronic kidney disease with heart failure and stage 1 through stage 4 chronic kidney disease, or unspecified chronic kidney disease: Secondary | ICD-10-CM | POA: Insufficient documentation

## 2017-01-16 DIAGNOSIS — E039 Hypothyroidism, unspecified: Secondary | ICD-10-CM | POA: Diagnosis not present

## 2017-01-16 DIAGNOSIS — Z7902 Long term (current) use of antithrombotics/antiplatelets: Secondary | ICD-10-CM | POA: Insufficient documentation

## 2017-01-16 DIAGNOSIS — K5900 Constipation, unspecified: Secondary | ICD-10-CM | POA: Diagnosis not present

## 2017-01-16 DIAGNOSIS — Z87891 Personal history of nicotine dependence: Secondary | ICD-10-CM | POA: Diagnosis not present

## 2017-01-16 DIAGNOSIS — I2510 Atherosclerotic heart disease of native coronary artery without angina pectoris: Secondary | ICD-10-CM | POA: Diagnosis not present

## 2017-01-16 DIAGNOSIS — Z955 Presence of coronary angioplasty implant and graft: Secondary | ICD-10-CM | POA: Insufficient documentation

## 2017-01-16 DIAGNOSIS — I5032 Chronic diastolic (congestive) heart failure: Secondary | ICD-10-CM | POA: Diagnosis not present

## 2017-01-16 DIAGNOSIS — N184 Chronic kidney disease, stage 4 (severe): Secondary | ICD-10-CM | POA: Insufficient documentation

## 2017-01-16 LAB — URINALYSIS, ROUTINE W REFLEX MICROSCOPIC
Bilirubin Urine: NEGATIVE
GLUCOSE, UA: NEGATIVE mg/dL
HGB URINE DIPSTICK: NEGATIVE
Ketones, ur: NEGATIVE mg/dL
Nitrite: NEGATIVE
Protein, ur: NEGATIVE mg/dL
SPECIFIC GRAVITY, URINE: 1.009 (ref 1.005–1.030)
pH: 6 (ref 5.0–8.0)

## 2017-01-16 LAB — COMPREHENSIVE METABOLIC PANEL
ALT: 15 U/L (ref 14–54)
AST: 22 U/L (ref 15–41)
Albumin: 3.4 g/dL — ABNORMAL LOW (ref 3.5–5.0)
Alkaline Phosphatase: 36 U/L — ABNORMAL LOW (ref 38–126)
Anion gap: 10 (ref 5–15)
BUN: 32 mg/dL — AB (ref 6–20)
CHLORIDE: 92 mmol/L — AB (ref 101–111)
CO2: 27 mmol/L (ref 22–32)
Calcium: 8.5 mg/dL — ABNORMAL LOW (ref 8.9–10.3)
Creatinine, Ser: 1.7 mg/dL — ABNORMAL HIGH (ref 0.44–1.00)
GFR, EST AFRICAN AMERICAN: 32 mL/min — AB (ref >60)
GFR, EST NON AFRICAN AMERICAN: 28 mL/min — AB (ref >60)
Glucose, Bld: 129 mg/dL — ABNORMAL HIGH (ref 65–99)
Potassium: 4.1 mmol/L (ref 3.5–5.1)
Sodium: 129 mmol/L — ABNORMAL LOW (ref 135–145)
TOTAL PROTEIN: 6 g/dL — AB (ref 6.5–8.1)
Total Bilirubin: 0.6 mg/dL (ref 0.3–1.2)

## 2017-01-16 LAB — PROTIME-INR
INR: 0.97
PROTHROMBIN TIME: 12.9 s (ref 11.4–15.2)

## 2017-01-16 LAB — CBC WITH DIFFERENTIAL/PLATELET
BASOS ABS: 0 10*3/uL (ref 0.0–0.1)
BASOS PCT: 0 %
Eosinophils Absolute: 0.3 10*3/uL (ref 0.0–0.7)
Eosinophils Relative: 1 %
HCT: 32.2 % — ABNORMAL LOW (ref 36.0–46.0)
Hemoglobin: 10.9 g/dL — ABNORMAL LOW (ref 12.0–15.0)
LYMPHS PCT: 12 %
Lymphs Abs: 2 10*3/uL (ref 0.7–4.0)
MCH: 29.8 pg (ref 26.0–34.0)
MCHC: 33.9 g/dL (ref 30.0–36.0)
MCV: 88 fL (ref 78.0–100.0)
MONO ABS: 1.2 10*3/uL — AB (ref 0.1–1.0)
Monocytes Relative: 7 %
Neutro Abs: 14 10*3/uL — ABNORMAL HIGH (ref 1.7–7.7)
Neutrophils Relative %: 80 %
Platelets: 214 10*3/uL (ref 150–400)
RBC: 3.66 MIL/uL — ABNORMAL LOW (ref 3.87–5.11)
RDW: 13.1 % (ref 11.5–15.5)
WBC: 17.5 10*3/uL — ABNORMAL HIGH (ref 4.0–10.5)

## 2017-01-16 LAB — I-STAT CG4 LACTIC ACID, ED: LACTIC ACID, VENOUS: 1.19 mmol/L (ref 0.5–1.9)

## 2017-01-16 LAB — LIPASE, BLOOD: LIPASE: 27 U/L (ref 11–51)

## 2017-01-16 MED ORDER — ONDANSETRON HCL 4 MG PO TABS: 4.0000 mg | ORAL_TABLET | Freq: Three times a day (TID) | ORAL | 0 refills | Status: DC | PRN

## 2017-01-16 MED ORDER — SODIUM CHLORIDE 0.9 % IV BOLUS (SEPSIS)
1000.0000 mL | Freq: Once | INTRAVENOUS | Status: AC
Start: 2017-01-16 — End: 2017-01-16
  Administered 2017-01-16: 1000 mL via INTRAVENOUS

## 2017-01-16 MED ORDER — FLEET ENEMA 7-19 GM/118ML RE ENEM
1.0000 | ENEMA | Freq: Once | RECTAL | Status: AC
  Administered 2017-01-16: 1 via RECTAL
  Filled 2017-01-16: qty 1

## 2017-01-16 MED ORDER — ONDANSETRON HCL 4 MG/2ML IJ SOLN
4.0000 mg | Freq: Once | INTRAMUSCULAR | Status: AC
  Administered 2017-01-16: 4 mg via INTRAVENOUS
  Filled 2017-01-16: qty 2

## 2017-01-16 NOTE — ED Notes (Signed)
Pt denies having to urinate right now. RN notified.

## 2017-01-16 NOTE — Discharge Instructions (Signed)
Your workup today showed evidence of severe constipation. We feel this is the cause of your pain and symptoms. Please use the nausea medicine to allow you to stay hydrated and to take her home MiraLAX. Please follow-up with your primary care physician for further management and workup of your pancreas abnormality. If any symptoms change or worsen, please return to the nearest emergency department.

## 2017-01-16 NOTE — ED Notes (Signed)
Patient transported to CT 

## 2017-01-16 NOTE — ED Provider Notes (Signed)
[facility] DEPT Provider Note   CSN: [id number] Arrival date & time: 01/16/17  1739     History   Chief Complaint Chief Complaint  Patient presents with  . Abdominal Pain  . Emesis    HPI Rebecca [name] is a 76 y.o. female.  The history is provided by the patient, a relative and medical records.  Abdominal Pain   This is a recurrent problem. The current episode started 6 to 12 hours ago. The problem occurs constantly. The problem has been gradually improving. The pain is associated with an unknown factor. The pain is located in the generalized abdominal region. The quality of the pain is aching and dull. The pain is severe. Associated symptoms include diarrhea, nausea, vomiting and constipation. Pertinent negatives include fever, hematochezia, melena, dysuria, frequency, hematuria and headaches. The symptoms are aggravated by palpation. Nothing relieves the symptoms.  Emesis   Associated symptoms include abdominal pain and diarrhea. Pertinent negatives include no chills, no cough, no fever and no headaches.    Past Medical History:  Diagnosis Date  . AKI (acute kidney injury) ([place])   . Anemia in chronic renal disease   . CAD (coronary artery disease) 2006   a. s/p DES to RCA in 2006 b. low-risk NST in 2014 c. cath in 08/2015 showing patent RCA stent with nonobstructive disease  . CKD (chronic kidney disease) stage 4, GFR 15-29 ml/min (HCC)   . Complication of anesthesia   . COPD (chronic obstructive pulmonary disease) ([place])   . Diabetes mellitus   . Diastolic heart failure, NYHA class 1 ([place])   . Dyslipidemia   . Dysuria 01/06/2015  . Frozen shoulder    right  . Gastroesophageal reflux   . HAP (hospital-acquired pneumonia)   . Headache   . Hyperkalemia 01/01/2016  . Hypothyroidism   . 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 Obesity   . Pneumonia 06/2016  . Polymyalgia rheumatica ([place])   . PONV (postoperative nausea and vomiting)   . Renal  insufficiency   . Respiratory distress   . Schizoaffective disorder ([place])   . Sepsis due to pneumonia ([place])   . Shortness of breath   . Stroke [facility])     Patient Active Problem List   Diagnosis Date Noted  . Fatigue 01/11/2017  . Mixed hyperlipidemia 12/02/2016  . Encephalopathy 11/20/2016  . Altered mental status 11/19/2016  . Aspiration pneumonia of both lower lobes due to gastric secretions ([place]) 10/10/2016  . Pneumonia 10/07/2016  . Lobar pneumonia, unspecified organism ([place]) 10/07/2016  . Ileus ([place]) 10/07/2016  . Healthcare-associated pneumonia 09/12/2016  . AKI (acute kidney injury) ([place]) 09/12/2016  . HAP (hospital-acquired pneumonia) 09/12/2016  . Respiratory distress 09/12/2016  . Pressure injury of skin 07/15/2016  . Essential hypertension 07/14/2016  . Acute encephalopathy 07/14/2016  . Coronary artery disease involving native coronary artery of native heart without angina pectoris 05/14/2016  . CKD stage 4, GFR 15-29 ml/min  08/11/2015  . 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 Leukocytosis 01/28/2014  . History of TIA - May 2015- Plavix 10/16/2013  . COPD exacerbation ([place]) 07/16/2013  . Polymyalgia rheumatica ([place]) 10/03/2012  . Chronic diastolic (congestive) heart failure ([place]) 07/15/2012  . Trochanteric bursitis of right hip 04/11/2012  . History of small bowel obstruction 11/04/2010  . Schizoaffective disorder ([place]) 01/10/2008  . Dyslipidemia 01/25/2007  . Hypothyroidism 08/11/2006  . Anemia in chronic renal disease 08/11/2006  . GASTROESOPHAGEAL REFLUX, NO ESOPHAGITIS 08/11/2006  Past Surgical History:  Procedure Laterality Date  . ABDOMINAL HYSTERECTOMY    . CARDIAC CATHETERIZATION N/A 08/13/2015   Procedure: Left Heart Cath and Coronary Angiography;  Surgeon: Jettie Booze, MD;  Location: [facility];  Service: Cardiovascular;  Laterality: N/A;  . CATARACT EXTRACTION    . CHOLECYSTECTOMY    . CORONARY  ANGIOPLASTY WITH STENT PLACEMENT  2006   Taxus DES to RCA, 50 % residual  . PARTIAL HYSTERECTOMY  1995    OB History    No data available       Home Medications    Prior to Admission medications   Medication Sig Start Date End Date Taking? Authorizing Provider  acetaminophen (TYLENOL) 500 MG tablet Take 1 tablet (500 mg total) by mouth every 6 (six) hours as needed (pain). 11/21/16   Arrien, Jimmy Picket, MD  cetirizine (ZYRTEC) 10 MG tablet Take 10 mg by mouth at bedtime.     [provider]  clopidogrel (PLAVIX) 75 MG tablet Take 1 tablet (75 mg total) by mouth daily with breakfast. Patient taking differently: Take 75 mg by mouth at bedtime.  10/18/13   Thurnell Lose, MD  cloZAPine (CLOZARIL) 100 MG tablet Take 300 mg by mouth at bedtime. 11/04/16   [provider]  Cranberry 250 MG TABS Take 1 tablet by mouth at bedtime.    [provider]  denosumab (PROLIA) 60 MG/ML SOLN injection Inject 60 mg into the skin every 6 (six) months. Administer in upper arm, thigh, or abdomen    [provider]  diltiazem (CARDIZEM LA) 180 MG 24 hr tablet Take 180 mg by mouth daily with breakfast. 07/16/15   [provider]  docusate sodium (COLACE) 100 MG capsule Take 200 mg by mouth at bedtime.  03/01/12   Katherina Mires, MD  Fluticasone-Salmeterol (ADVAIR) 250-50 MCG/DOSE AEPB Inhale 1 puff into the lungs at bedtime.    [provider]  furosemide (LASIX) 20 MG tablet Take 1 tablet (20 mg total) by mouth daily as needed. 12/02/16 03/02/17  Hilty, Nadean Corwin, MD  guaiFENesin (ROBITUSSIN) 100 MG/5ML liquid Take 400 mg by mouth 3 (three) times daily as needed for cough.    [provider]  ipratropium-albuterol (DUONEB) 0.5-2.5 (3) MG/3ML SOLN Inhale 3 mLs into the lungs 3 (three) times daily as needed for cough or wheezing. 11/05/16   [provider]  isosorbide mononitrate (IMDUR) 60 MG 24 hr tablet Take 1 tablet (60 mg total) by mouth  daily. 03/14/13   Isaiah Serge, NP  levothyroxine (SYNTHROID, LEVOTHROID) 75 MCG tablet Take 75 mcg by mouth daily before breakfast.    [provider]  metoprolol succinate (TOPROL-XL) 100 MG 24 hr tablet Take 100 mg by mouth daily with breakfast. Take with or immediately following a meal.    [provider]  Multiple Vitamins-Minerals (MULTIVITAMIN WITH MINERALS) tablet Take 1 tablet by mouth daily.    [provider]  NEOMYCIN-POLYMYXIN-HYDROCORTISONE (CORTISPORIN) 1 % SOLN OTIC solution Apply 1-2 drops to toe BID after soaking 11/30/16   Hyatt, Max T, DPM  nitrofurantoin, macrocrystal-monohydrate, (MACROBID) 100 MG capsule Take 1 capsule by mouth daily. 01/06/17   [provider]  nitroGLYCERIN (NITROSTAT) 0.4 MG SL tablet Place 1 tablet (0.4 mg total) under the tongue every 5 (five) minutes as needed. For chest pain 01/11/17   Ahmed Prima, [place] M, PA-C  pantoprazole (PROTONIX) 40 MG tablet Take 1 tablet (40 mg total) by mouth daily. 04/03/13  Pixie Casino, MD  polyethylene glycol powder (GLYCOLAX/MIRALAX) powder Take 17 g by mouth daily. [place]    [provider]  pravastatin (PRAVACHOL) 20 MG tablet Take 20 mg by mouth at bedtime.    [provider]  predniSONE (DELTASONE) 10 MG tablet Take 1 tablet (10 mg total) by mouth daily with breakfast. 10/11/16   Charlynne Cousins, MD  Probiotic Product (PROBIOTIC PO) Take 1 capsule by mouth daily.    [provider]  Tiotropium Bromide Monohydrate (SPIRIVA RESPIMAT) 2.5 MCG/ACT AERS Inhale 2 puffs into the lungs every morning.     [provider]  traMADol (ULTRAM) 50 MG tablet Take 50-100 mg by mouth every 6 (six) hours as needed for pain.  10/25/16   [provider]  traMADol (ULTRAM-ER) 300 MG 24 hr tablet Take 300 mg by mouth every morning. 11/01/16   [provider]  Vitamin D, Ergocalciferol, (DRISDOL) 50000 UNITS CAPS capsule Take 1 capsule  (50,000 Units total) by mouth every 7 (seven) days. 08/27/13   Kinnie Feil, MD    Family History Family History  Problem Relation Age of Onset  . Diabetes Mother   . Heart disease Mother   . Heart disease Father   . Arthritis Father   . Healthy Sister   . Heart disease Brother   . Cancer Brother        colon ca  . Osteoporosis Sister   . Heart disease Brother   . Diabetes Brother   . Heart disease Brother   . Diabetes Brother   . Diabetes Brother   . Healthy Brother   . Healthy Brother   . Healthy Brother   . Cancer Maternal Uncle        blood condition  . Cancer Daughter        breast ca  . Cancer Cousin        NHL    Social History Social History  Substance Use Topics  . Smoking status: Former Smoker    Packs/day: 1.00    Years: 35.00    Quit date: 04/14/2004  . Smokeless tobacco: Never Used  . Alcohol use No     Allergies   Levaquin [levofloxacin] and Tape   Review of Systems Review of Systems  Constitutional: Positive for fatigue. Negative for chills, diaphoresis and fever.  HENT: Negative for congestion and rhinorrhea.   Respiratory: Negative for cough, chest tightness, shortness of breath, wheezing and stridor.   Cardiovascular: Negative for chest pain, palpitations and leg swelling.  Gastrointestinal: Positive for abdominal distention, abdominal pain, constipation, diarrhea, nausea and vomiting. Negative for hematochezia and melena.  Genitourinary: Positive for decreased urine volume. Negative for dysuria, flank pain, frequency and hematuria.  Musculoskeletal: Negative for back pain, neck pain and neck stiffness.  Skin: Negative for rash and wound.  Neurological: Negative for speech difficulty, weakness, light-headedness, numbness and headaches.  Psychiatric/Behavioral: Negative for agitation.  All other systems reviewed and are negative.    Physical Exam Updated Vital Signs BP (!) 129/51 (BP Location: Left Arm)   Pulse 85   Temp 99 F  (37.2 C) (Oral)   Resp 18   SpO2 95%   Physical Exam  Constitutional: She is oriented to person, place, and time. She appears well-developed and well-nourished. No distress.  HENT:  Head: Normocephalic.  Mouth/Throat: Oropharynx is clear and moist. No oropharyngeal exudate.  Eyes: Pupils are equal, round, and reactive to light. Conjunctivae and EOM are normal.  Neck:  Normal range of motion.  Cardiovascular: Normal rate and intact distal pulses.   No murmur heard. Pulmonary/Chest: Effort normal. No stridor. No respiratory distress. She has no wheezes. She exhibits no tenderness.  Abdominal: Soft. She exhibits distension. Bowel sounds are decreased. There is generalized tenderness. There is no rigidity, no rebound, no guarding and no CVA tenderness.    Musculoskeletal: She exhibits no edema or tenderness.  Neurological: She is alert and oriented to person, place, and time. No cranial nerve deficit or sensory deficit. She exhibits normal muscle tone.  Skin: Capillary refill takes less than 2 seconds. No rash noted. She is not diaphoretic. No erythema.  Psychiatric: She has a normal mood and affect.  Nursing note and vitals reviewed.    ED Treatments / Results  Labs (all labs ordered are listed, but only abnormal results are displayed) Labs Reviewed  CBC WITH DIFFERENTIAL/PLATELET - Abnormal; Notable for the following:       Result Value   WBC 17.5 (*)    RBC 3.66 (*)    Hemoglobin 10.9 (*)    HCT 32.2 (*)    Neutro Abs 14.0 (*)    Monocytes Absolute 1.2 (*)    All other components within normal limits  COMPREHENSIVE METABOLIC PANEL - Abnormal; Notable for the following:    Sodium 129 (*)    Chloride 92 (*)    Glucose, Bld 129 (*)    BUN 32 (*)    Creatinine, Ser 1.70 (*)    Calcium 8.5 (*)    Total Protein 6.0 (*)    Albumin 3.4 (*)    Alkaline Phosphatase 36 (*)    GFR calc non Af Amer 28 (*)    GFR calc Af Amer 32 (*)    All other components within normal limits    URINALYSIS, ROUTINE W REFLEX MICROSCOPIC - Abnormal; Notable for the following:    APPearance HAZY (*)    Leukocytes, UA TRACE (*)    Bacteria, UA RARE (*)    Squamous Epithelial / LPF 6-30 (*)    All other components within normal limits  URINE CULTURE  LIPASE, BLOOD  PROTIME-INR  I-STAT CG4 LACTIC ACID, ED    EKG  EKG Interpretation None       Radiology Ct Abdomen Pelvis Wo Contrast  Result Date: 01/16/2017 CLINICAL DATA:  Pt is c/o diffuse abdominal pain associated with distension and a hx of constipation and bowel obstruction. Adds that she has multiple episodes of emesis and family administered an enema this morning after which she had a significant bowel movement EXAM: CT ABDOMEN AND PELVIS WITHOUT CONTRAST TECHNIQUE: Multidetector CT imaging of the abdomen and pelvis was performed following the standard protocol without IV contrast. COMPARISON:  10/07/2016 FINDINGS: Lower chest: There is patchy airspace filling opacity in the left lower lobe, bibasilar scarring is again identified, left greater than right. No new or suspicious findings in the lung bases. Coronary artery calcifications and stent are present. Hepatobiliary: Status post cholecystectomy. The liver is homogeneous. Pancreas: No pancreatic ductal dilatation or surrounding inflammatory changes. There is a fluid attenuation mass within the pancreatic body measuring 2.8 x 2.2 cm. Mass appears stable compared to the most recent study but demonstrates growth since study in 2013. Spleen: Normal in size without focal abnormality. Adrenals/Urinary Tract: The adrenal glands are normal. There are bilateral renal cysts. No hydronephrosis or ureteral obstruction. Urinary bladder is normal in appearance. Stomach/Bowel: The stomach is normal in appearance. Small bowel loops are normal in appearance.  The appendix is well seen and has a normal appearance. There is marked distension of the entire colon by solid stool. The colon is tortuous and  distended to level of the cecum. Vascular/Lymphatic: There is atherosclerotic calcification of the abdominal aorta not associated with aneurysm. There is significant calcified mural thrombus, stable in appearance and best seen on image 21 of series 2. No retroperitoneal or mesenteric adenopathy. Reproductive: Status post hysterectomy.  No adnexal mass. Other: None Musculoskeletal: Significant degenerative changes in the lower thoracic in lumbar spine. There is 5 mm retrolisthesis associated with significant disc height loss at L2-3. Vacuum disc identified at L4-5 and L5-S1. No suspicious lytic or blastic lesions are identified. IMPRESSION: 1. Distended tortuous colon containing a large amount of stool, likely related to the patient's symptoms of abdominal pain. 2. 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 By consensus criteria, referral for possible endoscopic ultrasound /FNA and surgical consultation is recommended. 3. Scarring at both lung bases. 4. Coronary artery disease. 5. Status post cholecystectomy. 6.  Aortic atherosclerosis.  (ICD10-I70.0) Electronically Signed   By: Nolon Nations M.D.   On: 01/16/2017 20:43   Dg Chest 2 View  Result Date: 01/16/2017 CLINICAL DATA:  Diffuse abdominal pain associated with distention. EXAM: CHEST  2 VIEW COMPARISON:  11/19/2016 CXR, 12/08/2016 CT FINDINGS: The heart size and mediastinal contours are within normal limits. Aortic atherosclerosis at the arch without aneurysmal dilatation. Bibasilar atelectasis is noted. No overt pulmonary edema. 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 No acute osseous abnormality. IMPRESSION: 1. Left basilar atelectasis. 2. Aortic atherosclerosis. 3. Bilateral AC and glenohumeral joint osteoarthritis with small ossific loose body in the left axillary pouch. Electronically Signed   By: Ashley Royalty M.D.   On:  01/16/2017 19:17    Procedures Procedures (including critical care time)  Medications Ordered in ED Medications  sodium phosphate (FLEET) 7-19 GM/118ML enema 1 enema (not administered)  ondansetron (ZOFRAN) injection 4 mg (not administered)  sodium chloride 0.9 % bolus 1,000 mL (0 mLs Intravenous Stopped 01/16/17 2031)     Initial Impression / Assessment and Plan / ED Course  I have reviewed the triage vital signs and the nursing notes.  Pertinent labs & imaging results that were available during my care of the patient were reviewed by me and considered in my medical decision making (see chart for details).     Maliyah A Hynes is a 76 y.o. 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 Patient is accompanied by her daughter who reports that patient was at a family reunion yesterday and had a very large meal. She then woke up at approximately 3 AM this morning with "violent" nausea and vomiting. It was nonbloody and nonbilious. It was large amount. Patient also had a very distended and "rockhard" abdomen according to the daughter. Patient had diffuse abdominal pain that was severe at the time. Daughter tried to give the patient MiraLAX which she subsequently vomited. Daughter then used a fleets enema and was able to have a moderate to large amount of diarrhea returned. Daughter says that for the last week or so the patient has had very small caliber stools.  Patient struggles with chronic constipation. Daughter says that she also has chronic aspiration and difficulty swallowing and she only eats pured foods.  Daughter says that the patient denies fevers but had chills. Patient had no  chest pain or shortness of breath but has had a dry cough. Patient reports her pain has improved and distention has improved however she is still feeling the pain. Patient reports a similar to her prior obstruction. Patient reports that her previous dysuria had improved with antibiotics.  On exam, patient's abdomen is diffusely tender. Patient had minimal bowel sounds present. Patient's lungs were coarse bilaterally and chest is nontender. No significant lower extremity edema. Normal pulses in lower extremities. No focal neurologic deficits.  Based on patient's history and complaint, there is, concern for constipation versus bowel traction. Patient mother reports that she has been having some difficulty with her electrolytes and kidneys recently. Suspect dehydration in the setting of the large amount of nausea and vomiting and intolerance of oral intake today. Patient will have laboratory testing and imaging to assess cause of symptoms. Given the history of aspiration with the emesis and the cough, x-ray will also be obtained to look for pneumonia. Patient will have urinalysis given the frequent UTIs and family report that the patient's urine was decreased and dark today.  Patient will be rehydrated. Anticipate reassessment following imaging. We'll hold on ordering a CT scan to determine if she is able to take contrast.   10:08 PM CT scan needed to be noncontrasted her kidney function worsening. Laboratory testing returned showing no nitrites and large epithelial cells concerning for contamination. Do not feel patient has UTI. Lactic acid nonelevated. CBC showed a similar hemoglobin to prior and similar leukocytosis to prior. CMP revealed increase in creatinine however her potassium is normalized. This was a concern for the patient's daughter. Sodium also slightly decreased from prior. Lipase not elevated INR normal.  CT scan showed no evidence of acute obstruction. The most pertinent finding was a large  stool burden. Suspect the mass amount of stool is causing the patient's discomfort and nausea with vomiting. Also found incidentally was a growing pancreatic lesion. Patient will need to follow-up with her PCP, had gastric tetralogy, and possibly surgery to further evaluate. I feel this is the cause of her large constipation and symptoms today.  Conversational daughter about game plan. Patient will be given Zofran to help with her nausea and attempt a by mouth challenge. Due to the large stool burden, a enema will be offered. If patient is able to have some bowel movement and is able to tolerate staying hydrated and oral MiraLAX at home, patient will be safe for discharge home.   Anticipate reassessment after enema.  Patient had a large bowel movement and was feeling better after enema. Patient was able to tolerate food and fluids after nausea medicine. Patient will be given prescription for nausea medicine and urged to follow-up with her primary doctor for further workup of her pancreatic abnormality. Patient and family understood return precautions for any new or worsened symptoms. Patient had no other questions or concerns and was discharged in good condition.   Final Clinical Impressions(s) / ED Diagnoses   Final diagnoses:  Constipation, unspecified constipation type  Abdominal pain, unspecified abdominal location    New Prescriptions New Prescriptions   ONDANSETRON (ZOFRAN) 4 MG TABLET    Take 1 tablet (4 mg total) by mouth every 8 (eight) hours as needed for nausea or vomiting.    Clinical Impression: 1.  Constipation, unspecified constipation type   2. Abdominal pain, unspecified abdominal location     Disposition: Discharge  Condition: Good  I have discussed the results, Dx and Tx plan with the pt(& family if present). He/she/they expressed understanding and agree(s) with the plan. Discharge instructions discussed at great length. Strict return precautions discussed and pt &/or  family have verbalized understanding of the instructions. No further questions at time of discharge.    New Prescriptions   ONDANSETRON (ZOFRAN) 4 MG TABLET    Take 1 tablet (4 mg total) by mouth every 8 (eight) hours as needed for nausea or vomiting.    Follow Up: [name], [place] [address] [phone]  Schedule an appointment as soon as possible for a visit    [place] DEPT [place] [phone] [place] [phone]  If symptoms worsen     Tegeler, Gwenyth Allegra, MD 01/17/17 [phone]

## 2017-01-16 NOTE — ED Triage Notes (Signed)
Pt is c/o diffuse abdominal pain associated with distension and a hx of constipation and bowel obstruction. Adds that she has multiple episodes of emesis and family administered an enema this morning after which she had a significant bowel movement.

## 2017-01-17 ENCOUNTER — Encounter: Payer: Self-pay | Admitting: Gastroenterology

## 2017-01-17 ENCOUNTER — Encounter: Payer: Self-pay | Admitting: Physician Assistant

## 2017-01-18 ENCOUNTER — Telehealth: Payer: Self-pay | Admitting: Student

## 2017-01-18 ENCOUNTER — Telehealth: Payer: Self-pay | Admitting: Physician Assistant

## 2017-01-18 ENCOUNTER — Telehealth: Payer: Self-pay | Admitting: *Deleted

## 2017-01-18 LAB — URINE CULTURE

## 2017-01-18 NOTE — Telephone Encounter (Signed)
Ok, she needs upper EUS, radial +/- linear, next available appt with MAC sedation. SHe is on plavix for CAD, will need OK from her cardiologist about holding the plavix for 5 days prior to the EUS.  Thanks

## 2017-01-18 NOTE — Telephone Encounter (Signed)
Daughter called to let Dr Alvy Bimler know that her mother was recently admitted. Growth on pancreas has increased in size- has appt with [facility] to evaluate. MD in hospital suggested they call to see if Dr Alvy Bimler sooner than September. Has been having night sweats- has to change sheets and clothes.

## 2017-01-18 NOTE — Telephone Encounter (Signed)
Daughter notified of message below. Will call [facility] to see if they can get her in sooner

## 2017-01-18 NOTE — Telephone Encounter (Signed)
Spoke daughterEstill Bamberg). She called to inform office that patient had labs done 01/16/17 in ER. POTASSIUM 4.1 DAUGHTER WANTED B. Strader PA TO KNOW she will not bring her for labs today .  RN informed daughter will notify B. Strader PA. If there is any instruction will call back. ( seel abs)

## 2017-01-18 NOTE — Telephone Encounter (Signed)
I suggest her daughter to call GI to see if they can work her in sooner (if cancellation). Without biopsy, I cannot help her now. I am asking GI oncology navigator to get her case for tumor board discussion next week and will get back to her afterwards

## 2017-01-18 NOTE — Telephone Encounter (Signed)
New message    Pt daughter is calling stating that over the weekend she had to take pt to ER. When she was in the hospital they did all of her lab work. So the results are in [place]. She said that pt potassium was down.

## 2017-01-18 NOTE — Telephone Encounter (Signed)
DONE AND CANCELLED

## 2017-01-18 NOTE — Telephone Encounter (Signed)
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  She was seen in the ED on 01/16/17 and was told to f.u with GI.  She was placed on the schedule to see Anderson Malta 8/17 as a new pt. The pt's daughter called me move the appt up sooner and I saw it was for a pancreatic lesion and called [facility] and confirmed the appt is for EUS.  I cancelled the appt with Anderson Malta and asked for the records to be faxed for your review.  I called the pt's daughter and updated her on the process and advised her the appt with Anderson Malta was not needed and I would call her to set up EUS after you have reviewed.

## 2017-01-18 NOTE — Telephone Encounter (Signed)
Can cancel the order. Thank you for letting me know!

## 2017-01-21 ENCOUNTER — Telehealth: Payer: Self-pay

## 2017-01-21 ENCOUNTER — Other Ambulatory Visit: Payer: Self-pay

## 2017-01-21 DIAGNOSIS — K869 Disease of pancreas, unspecified: Secondary | ICD-10-CM

## 2017-01-21 NOTE — Telephone Encounter (Signed)
Ok to hold plavix 5 days prior to procedure and restart after. Acceptable risk for EUS.  Dr. Debara Pickett

## 2017-01-21 NOTE — Telephone Encounter (Signed)
Irven [place] sent to Jeoffrey Massed, RN        Please call Kenney Houseman at [facility]. [phone] ext. [phone]. She is wanting to know when patient will be scheduled for EUS

## 2017-01-21 NOTE — Telephone Encounter (Signed)
-----   Message from Pixie Casino, MD sent at 01/21/2017 12:13 PM EDT -----   ----- Message ----- From: Jeoffrey Massed, RN Sent: 01/21/2017  11:27 AM To: Pixie Casino, MD

## 2017-01-21 NOTE — Telephone Encounter (Signed)
EUS scheduled, pt instructed and medications reviewed.  Patient instructions mailed to home.  Patient to call with any questions or concerns.  

## 2017-01-21 NOTE — Telephone Encounter (Signed)
Pixie Casino, MD  Cardiology    [] Hide copied text [] Hover for attribution information Ok to hold plavix 5 days prior to procedure and restart after. Acceptable risk for EUS.      The patient has been notified of this information and all questions answered.

## 2017-01-25 ENCOUNTER — Inpatient Hospital Stay (HOSPITAL_COMMUNITY): Payer: Medicare Other

## 2017-01-25 ENCOUNTER — Inpatient Hospital Stay (HOSPITAL_COMMUNITY)
Admission: EM | Admit: 2017-01-25 | Discharge: 2017-01-28 | DRG: 643 | Disposition: A | Discharge status: Home / Self Care | Payer: Medicare Other | Attending: Family Medicine | Admitting: Family Medicine

## 2017-01-25 ENCOUNTER — Emergency Department (HOSPITAL_COMMUNITY): Payer: Medicare Other

## 2017-01-25 ENCOUNTER — Encounter (HOSPITAL_COMMUNITY): Payer: Self-pay

## 2017-01-25 DIAGNOSIS — Z8249 Family history of ischemic heart disease and other diseases of the circulatory system: Secondary | ICD-10-CM | POA: Diagnosis not present

## 2017-01-25 DIAGNOSIS — E039 Hypothyroidism, unspecified: Secondary | ICD-10-CM | POA: Diagnosis present

## 2017-01-25 DIAGNOSIS — Z8262 Family history of osteoporosis: Secondary | ICD-10-CM

## 2017-01-25 DIAGNOSIS — Z888 Allergy status to other drugs, medicaments and biological substances status: Secondary | ICD-10-CM

## 2017-01-25 DIAGNOSIS — N183 Chronic kidney disease, stage 3 (moderate): Secondary | ICD-10-CM

## 2017-01-25 DIAGNOSIS — E785 Hyperlipidemia, unspecified: Secondary | ICD-10-CM | POA: Diagnosis not present

## 2017-01-25 DIAGNOSIS — E1151 Type 2 diabetes mellitus with diabetic peripheral angiopathy without gangrene: Secondary | ICD-10-CM | POA: Diagnosis present

## 2017-01-25 DIAGNOSIS — E871 Hypo-osmolality and hyponatremia: Secondary | ICD-10-CM

## 2017-01-25 DIAGNOSIS — Z9049 Acquired absence of other specified parts of digestive tract: Secondary | ICD-10-CM | POA: Diagnosis not present

## 2017-01-25 DIAGNOSIS — I700 Atherosclerosis of aorta: Secondary | ICD-10-CM | POA: Diagnosis not present

## 2017-01-25 DIAGNOSIS — I10 Essential (primary) hypertension: Secondary | ICD-10-CM | POA: Diagnosis present

## 2017-01-25 DIAGNOSIS — J690 Pneumonitis due to inhalation of food and vomit: Secondary | ICD-10-CM | POA: Diagnosis present

## 2017-01-25 DIAGNOSIS — Z955 Presence of coronary angioplasty implant and graft: Secondary | ICD-10-CM | POA: Diagnosis not present

## 2017-01-25 DIAGNOSIS — Z6824 Body mass index (BMI) 24.0-24.9, adult: Secondary | ICD-10-CM

## 2017-01-25 DIAGNOSIS — J189 Pneumonia, unspecified organism: Secondary | ICD-10-CM | POA: Diagnosis present

## 2017-01-25 DIAGNOSIS — J440 Chronic obstructive pulmonary disease with acute lower respiratory infection: Secondary | ICD-10-CM | POA: Diagnosis present

## 2017-01-25 DIAGNOSIS — I130 Hypertensive heart and chronic kidney disease with heart failure and stage 1 through stage 4 chronic kidney disease, or unspecified chronic kidney disease: Secondary | ICD-10-CM | POA: Diagnosis present

## 2017-01-25 DIAGNOSIS — N184 Chronic kidney disease, stage 4 (severe): Secondary | ICD-10-CM | POA: Diagnosis present

## 2017-01-25 DIAGNOSIS — Z833 Family history of diabetes mellitus: Secondary | ICD-10-CM

## 2017-01-25 DIAGNOSIS — I5032 Chronic diastolic (congestive) heart failure: Secondary | ICD-10-CM | POA: Diagnosis present

## 2017-01-25 DIAGNOSIS — F259 Schizoaffective disorder, unspecified: Secondary | ICD-10-CM | POA: Diagnosis not present

## 2017-01-25 DIAGNOSIS — K5909 Other constipation: Secondary | ICD-10-CM | POA: Diagnosis present

## 2017-01-25 DIAGNOSIS — Z87891 Personal history of nicotine dependence: Secondary | ICD-10-CM | POA: Diagnosis not present

## 2017-01-25 DIAGNOSIS — D72829 Elevated white blood cell count, unspecified: Secondary | ICD-10-CM | POA: Diagnosis not present

## 2017-01-25 DIAGNOSIS — Z90711 Acquired absence of uterus with remaining cervical stump: Secondary | ICD-10-CM

## 2017-01-25 DIAGNOSIS — G459 Transient cerebral ischemic attack, unspecified: Secondary | ICD-10-CM

## 2017-01-25 DIAGNOSIS — R932 Abnormal findings on diagnostic imaging of liver and biliary tract: Secondary | ICD-10-CM | POA: Diagnosis not present

## 2017-01-25 DIAGNOSIS — Z7189 Other specified counseling: Secondary | ICD-10-CM | POA: Diagnosis not present

## 2017-01-25 DIAGNOSIS — K219 Gastro-esophageal reflux disease without esophagitis: Secondary | ICD-10-CM | POA: Diagnosis present

## 2017-01-25 DIAGNOSIS — Z9849 Cataract extraction status, unspecified eye: Secondary | ICD-10-CM | POA: Diagnosis not present

## 2017-01-25 DIAGNOSIS — E222 Syndrome of inappropriate secretion of antidiuretic hormone: Principal | ICD-10-CM | POA: Diagnosis present

## 2017-01-25 DIAGNOSIS — R5383 Other fatigue: Secondary | ICD-10-CM | POA: Diagnosis not present

## 2017-01-25 DIAGNOSIS — Z803 Family history of malignant neoplasm of breast: Secondary | ICD-10-CM

## 2017-01-25 DIAGNOSIS — Z8261 Family history of arthritis: Secondary | ICD-10-CM

## 2017-01-25 DIAGNOSIS — R1310 Dysphagia, unspecified: Secondary | ICD-10-CM | POA: Diagnosis not present

## 2017-01-25 DIAGNOSIS — Z7952 Long term (current) use of systemic steroids: Secondary | ICD-10-CM

## 2017-01-25 DIAGNOSIS — M353 Polymyalgia rheumatica: Secondary | ICD-10-CM | POA: Diagnosis present

## 2017-01-25 DIAGNOSIS — N281 Cyst of kidney, acquired: Secondary | ICD-10-CM | POA: Diagnosis not present

## 2017-01-25 DIAGNOSIS — I679 Cerebrovascular disease, unspecified: Secondary | ICD-10-CM | POA: Diagnosis not present

## 2017-01-25 DIAGNOSIS — E669 Obesity, unspecified: Secondary | ICD-10-CM | POA: Diagnosis present

## 2017-01-25 DIAGNOSIS — R51 Headache: Secondary | ICD-10-CM | POA: Diagnosis not present

## 2017-01-25 DIAGNOSIS — R29810 Facial weakness: Secondary | ICD-10-CM | POA: Diagnosis present

## 2017-01-25 DIAGNOSIS — N189 Chronic kidney disease, unspecified: Secondary | ICD-10-CM

## 2017-01-25 DIAGNOSIS — D472 Monoclonal gammopathy: Secondary | ICD-10-CM | POA: Diagnosis present

## 2017-01-25 DIAGNOSIS — Q453 Other congenital malformations of pancreas and pancreatic duct: Secondary | ICD-10-CM

## 2017-01-25 DIAGNOSIS — J441 Chronic obstructive pulmonary disease with (acute) exacerbation: Secondary | ICD-10-CM | POA: Diagnosis not present

## 2017-01-25 DIAGNOSIS — K8689 Other specified diseases of pancreas: Secondary | ICD-10-CM

## 2017-01-25 DIAGNOSIS — Z8673 Personal history of transient ischemic attack (TIA), and cerebral infarction without residual deficits: Secondary | ICD-10-CM

## 2017-01-25 DIAGNOSIS — J449 Chronic obstructive pulmonary disease, unspecified: Secondary | ICD-10-CM | POA: Diagnosis not present

## 2017-01-25 DIAGNOSIS — Z800 Family history of malignant neoplasm of digestive organs: Secondary | ICD-10-CM

## 2017-01-25 DIAGNOSIS — Z7951 Long term (current) use of inhaled steroids: Secondary | ICD-10-CM

## 2017-01-25 DIAGNOSIS — K869 Disease of pancreas, unspecified: Secondary | ICD-10-CM | POA: Diagnosis not present

## 2017-01-25 DIAGNOSIS — E1122 Type 2 diabetes mellitus with diabetic chronic kidney disease: Secondary | ICD-10-CM | POA: Diagnosis present

## 2017-01-25 DIAGNOSIS — I25810 Atherosclerosis of coronary artery bypass graft(s) without angina pectoris: Secondary | ICD-10-CM | POA: Diagnosis not present

## 2017-01-25 DIAGNOSIS — I2510 Atherosclerotic heart disease of native coronary artery without angina pectoris: Secondary | ICD-10-CM | POA: Diagnosis not present

## 2017-01-25 DIAGNOSIS — R918 Other nonspecific abnormal finding of lung field: Secondary | ICD-10-CM | POA: Diagnosis not present

## 2017-01-25 DIAGNOSIS — Z515 Encounter for palliative care: Secondary | ICD-10-CM

## 2017-01-25 DIAGNOSIS — D631 Anemia in chronic kidney disease: Secondary | ICD-10-CM | POA: Diagnosis present

## 2017-01-25 DIAGNOSIS — R531 Weakness: Secondary | ICD-10-CM

## 2017-01-25 DIAGNOSIS — R0602 Shortness of breath: Secondary | ICD-10-CM

## 2017-01-25 DIAGNOSIS — Z79899 Other long term (current) drug therapy: Secondary | ICD-10-CM

## 2017-01-25 DIAGNOSIS — T380X5A Adverse effect of glucocorticoids and synthetic analogues, initial encounter: Secondary | ICD-10-CM | POA: Diagnosis present

## 2017-01-25 DIAGNOSIS — E1159 Type 2 diabetes mellitus with other circulatory complications: Secondary | ICD-10-CM | POA: Diagnosis not present

## 2017-01-25 HISTORY — DX: Atherosclerosis of aorta: I70.0

## 2017-01-25 HISTORY — DX: Other specified diseases of pancreas: K86.89

## 2017-01-25 LAB — COMPREHENSIVE METABOLIC PANEL
ALK PHOS: 38 U/L (ref 38–126)
ALT: 12 U/L — AB (ref 14–54)
ANION GAP: 7 (ref 5–15)
AST: 14 U/L — ABNORMAL LOW (ref 15–41)
Albumin: 3.3 g/dL — ABNORMAL LOW (ref 3.5–5.0)
BILIRUBIN TOTAL: 0.3 mg/dL (ref 0.3–1.2)
BUN: 33 mg/dL — ABNORMAL HIGH (ref 6–20)
CALCIUM: 9.4 mg/dL (ref 8.9–10.3)
CO2: 27 mmol/L (ref 22–32)
Chloride: 94 mmol/L — ABNORMAL LOW (ref 101–111)
Creatinine, Ser: 1.57 mg/dL — ABNORMAL HIGH (ref 0.44–1.00)
GFR, EST AFRICAN AMERICAN: 36 mL/min — AB (ref >60)
GFR, EST NON AFRICAN AMERICAN: 31 mL/min — AB (ref >60)
Glucose, Bld: 175 mg/dL — ABNORMAL HIGH (ref 65–99)
Potassium: 5 mmol/L (ref 3.5–5.1)
Sodium: 128 mmol/L — ABNORMAL LOW (ref 135–145)
TOTAL PROTEIN: 6.2 g/dL — AB (ref 6.5–8.1)

## 2017-01-25 LAB — I-STAT CHEM 8, ED
BUN: 30 mg/dL — AB (ref 6–20)
CHLORIDE: 91 mmol/L — AB (ref 101–111)
Calcium, Ion: 1.24 mmol/L (ref 1.15–1.40)
Creatinine, Ser: 1.5 mg/dL — ABNORMAL HIGH (ref 0.44–1.00)
GLUCOSE: 170 mg/dL — AB (ref 65–99)
HCT: 28 % — ABNORMAL LOW (ref 36.0–46.0)
Hemoglobin: 9.5 g/dL — ABNORMAL LOW (ref 12.0–15.0)
POTASSIUM: 4.9 mmol/L (ref 3.5–5.1)
Sodium: 125 mmol/L — ABNORMAL LOW (ref 135–145)
TCO2: 25 mmol/L (ref 0–100)

## 2017-01-25 LAB — URINALYSIS, ROUTINE W REFLEX MICROSCOPIC
Bilirubin Urine: NEGATIVE
Glucose, UA: NEGATIVE mg/dL
HGB URINE DIPSTICK: NEGATIVE
Ketones, ur: NEGATIVE mg/dL
Leukocytes, UA: NEGATIVE
NITRITE: NEGATIVE
Protein, ur: NEGATIVE mg/dL
SPECIFIC GRAVITY, URINE: 1.009 (ref 1.005–1.030)
pH: 6 (ref 5.0–8.0)

## 2017-01-25 LAB — DIFFERENTIAL
Basophils Absolute: 0 10*3/uL (ref 0.0–0.1)
Basophils Relative: 0 %
EOS PCT: 1 %
Eosinophils Absolute: 0.2 10*3/uL (ref 0.0–0.7)
LYMPHS ABS: 1.1 10*3/uL (ref 0.7–4.0)
LYMPHS PCT: 5 %
MONO ABS: 0.7 10*3/uL (ref 0.1–1.0)
MONOS PCT: 3 %
Neutro Abs: 19.5 10*3/uL — ABNORMAL HIGH (ref 1.7–7.7)
Neutrophils Relative %: 91 %

## 2017-01-25 LAB — I-STAT TROPONIN, ED: Troponin i, poc: 0 ng/mL (ref 0.00–0.08)

## 2017-01-25 LAB — INFLUENZA PANEL BY PCR (TYPE A & B)
INFLAPCR: NEGATIVE
INFLBPCR: NEGATIVE

## 2017-01-25 LAB — BRAIN NATRIURETIC PEPTIDE: B NATRIURETIC PEPTIDE 5: 109.5 pg/mL — AB (ref 0.0–100.0)

## 2017-01-25 LAB — CBC
HEMATOCRIT: 28.4 % — AB (ref 36.0–46.0)
Hemoglobin: 9.7 g/dL — ABNORMAL LOW (ref 12.0–15.0)
MCH: 29.9 pg (ref 26.0–34.0)
MCHC: 34.2 g/dL (ref 30.0–36.0)
MCV: 87.7 fL (ref 78.0–100.0)
PLATELETS: 228 10*3/uL (ref 150–400)
RBC: 3.24 MIL/uL — AB (ref 3.87–5.11)
RDW: 13 % (ref 11.5–15.5)
WBC: 21.4 10*3/uL — ABNORMAL HIGH (ref 4.0–10.5)

## 2017-01-25 LAB — TROPONIN I: Troponin I: <0.03 ng/mL (ref <0.03)

## 2017-01-25 LAB — PROTIME-INR
INR: 0.95
PROTHROMBIN TIME: 12.7 s (ref 11.4–15.2)

## 2017-01-25 LAB — MAGNESIUM: MAGNESIUM: 1.6 mg/dL — AB (ref 1.7–2.4)

## 2017-01-25 LAB — CBG MONITORING, ED: GLUCOSE-CAPILLARY: 184 mg/dL — AB (ref 65–99)

## 2017-01-25 LAB — APTT: aPTT: 30 seconds (ref 24–36)

## 2017-01-25 LAB — I-STAT CG4 LACTIC ACID, ED: LACTIC ACID, VENOUS: 1.25 mmol/L (ref 0.5–1.9)

## 2017-01-25 LAB — TSH: TSH: 1.173 u[IU]/mL (ref 0.350–4.500)

## 2017-01-25 LAB — STREP PNEUMONIAE URINARY ANTIGEN: Strep Pneumo Urinary Antigen: NEGATIVE

## 2017-01-25 LAB — CORTISOL: CORTISOL PLASMA: 3.9 ug/dL

## 2017-01-25 MED ORDER — PRAVASTATIN SODIUM 20 MG PO TABS
20.0000 mg | ORAL_TABLET | Freq: Every day | ORAL | Status: DC
  Administered 2017-01-25 – 2017-01-27 (×3): 20 mg via ORAL
  Filled 2017-01-25 (×3): qty 1

## 2017-01-25 MED ORDER — DEXTROSE 5 % IV SOLN
500.0000 mg | INTRAVENOUS | Status: DC
  Administered 2017-01-26: 500 mg via INTRAVENOUS
  Filled 2017-01-25 (×2): qty 500

## 2017-01-25 MED ORDER — ISOSORBIDE MONONITRATE ER 60 MG PO TB24
60.0000 mg | ORAL_TABLET | Freq: Every day | ORAL | Status: DC
  Administered 2017-01-26: 60 mg via ORAL
  Filled 2017-01-25: qty 1

## 2017-01-25 MED ORDER — POLYETHYLENE GLYCOL 3350 17 G PO PACK
17.0000 g | PACK | Freq: Every day | ORAL | Status: DC
  Administered 2017-01-26 – 2017-01-28 (×2): 17 g via ORAL
  Filled 2017-01-25 (×2): qty 1

## 2017-01-25 MED ORDER — ONDANSETRON HCL 4 MG/2ML IJ SOLN: 4.0000 mg | Freq: Four times a day (QID) | INTRAMUSCULAR | Status: DC | PRN

## 2017-01-25 MED ORDER — CLOPIDOGREL BISULFATE 75 MG PO TABS
75.0000 mg | ORAL_TABLET | Freq: Every day | ORAL | Status: DC
  Administered 2017-01-25 – 2017-01-27 (×3): 75 mg via ORAL
  Filled 2017-01-25 (×3): qty 1

## 2017-01-25 MED ORDER — PANTOPRAZOLE SODIUM 40 MG PO TBEC
40.0000 mg | DELAYED_RELEASE_TABLET | Freq: Every day | ORAL | Status: DC
  Administered 2017-01-26 – 2017-01-28 (×3): 40 mg via ORAL
  Filled 2017-01-25 (×3): qty 1

## 2017-01-25 MED ORDER — ACETAMINOPHEN 325 MG PO TABS
650.0000 mg | ORAL_TABLET | Freq: Four times a day (QID) | ORAL | Status: DC | PRN
  Administered 2017-01-26 – 2017-01-27 (×2): 650 mg via ORAL
  Filled 2017-01-25 (×2): qty 2

## 2017-01-25 MED ORDER — TIOTROPIUM BROMIDE MONOHYDRATE 18 MCG IN CAPS
1.0000 | ORAL_CAPSULE | Freq: Every morning | RESPIRATORY_TRACT | Status: DC
  Administered 2017-01-26 – 2017-01-28 (×2): 18 ug via RESPIRATORY_TRACT
  Filled 2017-01-25: qty 5

## 2017-01-25 MED ORDER — HEPARIN SODIUM (PORCINE) 5000 UNIT/ML IJ SOLN
5000.0000 [IU] | Freq: Three times a day (TID) | INTRAMUSCULAR | Status: AC
  Administered 2017-01-25 – 2017-01-27 (×6): 5000 [IU] via SUBCUTANEOUS
  Filled 2017-01-25 (×7): qty 1

## 2017-01-25 MED ORDER — PREDNISONE 10 MG PO TABS
10.0000 mg | ORAL_TABLET | Freq: Every day | ORAL | Status: DC
  Administered 2017-01-26: 10 mg via ORAL
  Filled 2017-01-25: qty 1

## 2017-01-25 MED ORDER — LEVOTHYROXINE SODIUM 25 MCG PO TABS
75.0000 ug | ORAL_TABLET | Freq: Every day | ORAL | Status: DC
  Administered 2017-01-26 – 2017-01-28 (×3): 75 ug via ORAL
  Filled 2017-01-25 (×3): qty 1

## 2017-01-25 MED ORDER — ONDANSETRON HCL 4 MG PO TABS: 4.0000 mg | ORAL_TABLET | Freq: Four times a day (QID) | ORAL | Status: DC | PRN

## 2017-01-25 MED ORDER — DEXTROSE 5 % IV SOLN
1.0000 g | INTRAVENOUS | Status: DC
  Administered 2017-01-25: 1 g via INTRAVENOUS
  Filled 2017-01-25 (×2): qty 10

## 2017-01-25 MED ORDER — ACETAMINOPHEN 650 MG RE SUPP: 650.0000 mg | Freq: Four times a day (QID) | RECTAL | Status: DC | PRN

## 2017-01-25 MED ORDER — SODIUM CHLORIDE 0.9 % IV SOLN
INTRAVENOUS | Status: DC
  Administered 2017-01-25 – 2017-01-26 (×2): via INTRAVENOUS

## 2017-01-25 MED ORDER — LEVALBUTEROL HCL 0.63 MG/3ML IN NEBU: 0.6300 mg | INHALATION_SOLUTION | Freq: Four times a day (QID) | RESPIRATORY_TRACT | Status: DC | PRN

## 2017-01-25 MED ORDER — DILTIAZEM HCL ER COATED BEADS 180 MG PO TB24
180.0000 mg | ORAL_TABLET | Freq: Every day | ORAL | Status: DC
  Filled 2017-01-25: qty 1

## 2017-01-25 MED ORDER — TRAMADOL HCL 50 MG PO TABS
50.0000 mg | ORAL_TABLET | Freq: Four times a day (QID) | ORAL | Status: DC | PRN
  Administered 2017-01-25 – 2017-01-28 (×6): 50 mg via ORAL
  Filled 2017-01-25 (×6): qty 1

## 2017-01-25 MED ORDER — DEXTROSE 5 % IV SOLN: 500.0000 mg | Freq: Once | INTRAVENOUS | Status: DC

## 2017-01-25 MED ORDER — DILTIAZEM HCL ER COATED BEADS 180 MG PO CP24
180.0000 mg | ORAL_CAPSULE | Freq: Every day | ORAL | Status: DC
  Administered 2017-01-26 – 2017-01-28 (×3): 180 mg via ORAL
  Filled 2017-01-25 (×3): qty 1

## 2017-01-25 MED ORDER — CLOZAPINE 100 MG PO TABS
300.0000 mg | ORAL_TABLET | Freq: Every day | ORAL | Status: DC
  Administered 2017-01-25 – 2017-01-27 (×3): 300 mg via ORAL
  Filled 2017-01-25 (×4): qty 3

## 2017-01-25 MED ORDER — POLYETHYLENE GLYCOL 3350 17 GM/SCOOP PO POWD: 17.0000 g | Freq: Every day | ORAL | Status: DC

## 2017-01-25 MED ORDER — MOMETASONE FURO-FORMOTEROL FUM 200-5 MCG/ACT IN AERO
2.0000 | INHALATION_SPRAY | Freq: Two times a day (BID) | RESPIRATORY_TRACT | Status: DC
  Administered 2017-01-26 – 2017-01-28 (×4): 2 via RESPIRATORY_TRACT
  Filled 2017-01-25: qty 8.8

## 2017-01-25 MED ORDER — SODIUM CHLORIDE 0.9 % IV BOLUS (SEPSIS)
500.0000 mL | Freq: Once | INTRAVENOUS | Status: AC
  Administered 2017-01-25: 500 mL via INTRAVENOUS

## 2017-01-25 MED ORDER — DEXTROSE 5 % IV SOLN
1.0000 g | Freq: Once | INTRAVENOUS | Status: DC
  Filled 2017-01-25: qty 10

## 2017-01-25 MED ORDER — METOPROLOL SUCCINATE ER 100 MG PO TB24
100.0000 mg | ORAL_TABLET | Freq: Every day | ORAL | Status: DC
  Administered 2017-01-26 – 2017-01-28 (×3): 100 mg via ORAL
  Filled 2017-01-25 (×3): qty 1

## 2017-01-25 NOTE — ED Notes (Signed)
BLOOD CULTURE X 1 5ML/EACH RT FOREARM OBTAINED

## 2017-01-25 NOTE — ED Notes (Signed)
ADMISSION RN AT BEDSIDE 

## 2017-01-25 NOTE — ED Notes (Signed)
Receiving bed 1440. Claiborne Billings receiving RN. Report scheduled for 1815. Phone for report [phone].

## 2017-01-25 NOTE — ED Triage Notes (Signed)
Pt c/o headache, lethargy and weakness today. Her son states that she has a hx of TIA and he noticed a L sided facial droop and drooling around 2pm. In triage, pt able to smile symmetrically and has equal grips. A&Ox4 at this time and ambulatory with assistance.

## 2017-01-25 NOTE — Progress Notes (Signed)
A consult was received from an ED physician for Rocephin/Zithromax per pharmacy dosing.  The patient's profile has been reviewed for ht/wt/allergies/indication/available labs.   A one time order has been placed for Rocephin 1gm & Zithromax 500mg .  Further antibiotics/pharmacy consults should be ordered by admitting physician if indicated.                       Thank you, Netta Cedars, PharmD, BCPS 01/25/2017@5 :34 PM

## 2017-01-25 NOTE — ED Notes (Signed)
ADMISSION Provider at bedside SPEAKING WITH FAMILY AND PT

## 2017-01-25 NOTE — ED Provider Notes (Signed)
[facility] DEPT Provider Note   CSN: [id number] Arrival date & time: 01/25/17  1522     History   Chief Complaint Chief Complaint  Patient presents with  . Weakness  . Headache    HPI Rebecca [name] is a 76 y.o. female.  76 y/o female here with transient left sided facial weakness that lasted for approx 2 hours--pt had h/o tia Awoke this am feeling diffusely weak Weakness has been  persistent and nothing makes it better or worse No emesis, fever, cough No recent medication changes Pt has had dysuria and frequency--w/o flank pain No tx used pta       Past Medical History:  Diagnosis Date  . AKI (acute kidney injury) ([place])   . Anemia in chronic renal disease   . CAD (coronary artery disease) 2006   a. s/p DES to RCA in 2006 b. low-risk NST in 2014 c. cath in 08/2015 showing patent RCA stent with nonobstructive disease  . CKD (chronic kidney disease) stage 4, GFR 15-29 ml/min (HCC)   . Complication of anesthesia   . COPD (chronic obstructive pulmonary disease) ([place])   . Diabetes mellitus   . Diastolic heart failure, NYHA class 1 ([place])   . Dyslipidemia   . Dysuria 01/06/2015  . Frozen shoulder    right  . Gastroesophageal reflux   . HAP (hospital-acquired pneumonia)   . Headache   . Hyperkalemia 01/01/2016  . Hypothyroidism   . 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 Obesity   . Pneumonia 06/2016  . Polymyalgia rheumatica ([place])   . PONV (postoperative nausea and vomiting)   . Renal insufficiency   . Respiratory distress   . Schizoaffective disorder ([place])   . Sepsis due to pneumonia ([place])   . Shortness of breath   . Stroke [facility])     Patient Active Problem List   Diagnosis Date Noted  . Fatigue 01/11/2017  . Mixed hyperlipidemia 12/02/2016  . Encephalopathy 11/20/2016  . Altered mental status 11/19/2016  . Aspiration pneumonia of both lower lobes due to gastric secretions ([place]) 10/10/2016  . Pneumonia 10/07/2016  . Lobar  pneumonia, unspecified organism ([place]) 10/07/2016  . Ileus ([place]) 10/07/2016  . Healthcare-associated pneumonia 09/12/2016  . AKI (acute kidney injury) ([place]) 09/12/2016  . HAP (hospital-acquired pneumonia) 09/12/2016  . Respiratory distress 09/12/2016  . Pressure injury of skin 07/15/2016  . Essential hypertension 07/14/2016  . Acute encephalopathy 07/14/2016  . Coronary artery disease involving native coronary artery of native heart without angina pectoris 05/14/2016  . CKD stage 4, GFR 15-29 ml/min  08/11/2015  . 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 Leukocytosis 01/28/2014  . History of TIA - May 2015- Plavix 10/16/2013  . COPD exacerbation ([place]) 07/16/2013  . Polymyalgia rheumatica ([place]) 10/03/2012  . Chronic diastolic (congestive) heart failure ([place]) 07/15/2012  . Trochanteric bursitis of right hip 04/11/2012  . History of small bowel obstruction 11/04/2010  . Schizoaffective disorder ([place]) 01/10/2008  . Dyslipidemia 01/25/2007  . Hypothyroidism 08/11/2006  . Anemia in chronic renal disease 08/11/2006  . GASTROESOPHAGEAL REFLUX, NO ESOPHAGITIS 08/11/2006    Past Surgical History:  Procedure Laterality Date  . ABDOMINAL HYSTERECTOMY    . CARDIAC CATHETERIZATION N/A 08/13/2015   Procedure: Left Heart Cath and Coronary Angiography;  Surgeon: Jettie Booze, MD;  Location: [facility];  Service: Cardiovascular;  Laterality: N/A;  . CATARACT EXTRACTION    . CHOLECYSTECTOMY    . CORONARY ANGIOPLASTY WITH STENT PLACEMENT  2006   [place]us DES to RCA, 50 % residual  . PARTIAL HYSTERECTOMY  1995    OB History    No data available       Home Medications    Prior to Admission medications   Medication Sig Start Date End Date Taking? Authorizing Provider  acetaminophen (TYLENOL) 500 MG tablet Take 1 tablet (500 mg total) by mouth every 6 (six) hours as needed (pain). 11/21/16   Arrien, Jimmy Picket, MD  cetirizine (ZYRTEC) 10 MG tablet Take  10 mg by mouth at bedtime.     [provider]  clopidogrel (PLAVIX) 75 MG tablet Take 1 tablet (75 mg total) by mouth daily with breakfast. Patient taking differently: Take 75 mg by mouth at bedtime.  10/18/13   Thurnell Lose, MD  cloZAPine (CLOZARIL) 100 MG tablet Take 300 mg by mouth at bedtime. 11/04/16   [provider]  Cranberry 250 MG TABS Take 1 tablet by mouth at bedtime.    [provider]  denosumab (PROLIA) 60 MG/ML SOLN injection Inject 60 mg into the skin every 6 (six) months. Administer in upper arm, thigh, or abdomen    [provider]  diltiazem (CARDIZEM LA) 180 MG 24 hr tablet Take 180 mg by mouth daily with breakfast. 07/16/15   [provider]  docusate sodium (COLACE) 100 MG capsule Take 200 mg by mouth at bedtime.  03/01/12   Katherina Mires, MD  Fluticasone-Salmeterol (ADVAIR) 250-50 MCG/DOSE AEPB Inhale 1 puff into the lungs at bedtime.    [provider]  furosemide (LASIX) 20 MG tablet Take 1 tablet (20 mg total) by mouth daily as needed. 12/02/16 03/02/17  Hilty, Nadean Corwin, MD  guaiFENesin (ROBITUSSIN) 100 MG/5ML liquid Take 400 mg by mouth 3 (three) times daily as needed for cough.    [provider]  ipratropium-albuterol (DUONEB) 0.5-2.5 (3) MG/3ML SOLN Inhale 3 mLs into the lungs 3 (three) times daily as needed for cough or wheezing. 11/05/16   [provider]  isosorbide mononitrate (IMDUR) 60 MG 24 hr tablet Take 1 tablet (60 mg total) by mouth daily. 03/14/13   Isaiah Serge, NP  levothyroxine (SYNTHROID, LEVOTHROID) 75 MCG tablet Take 75 mcg by mouth daily before breakfast.    [provider]  metoprolol succinate (TOPROL-XL) 100 MG 24 hr tablet Take 100 mg by mouth daily with breakfast. Take with or immediately following a meal.    [provider]  Multiple Vitamins-Minerals (MULTIVITAMIN WITH MINERALS) tablet Take 1 tablet by mouth daily.    [provider]    NEOMYCIN-POLYMYXIN-HYDROCORTISONE (CORTISPORIN) 1 % SOLN OTIC solution Apply 1-2 drops to toe BID after soaking Patient not taking: Reported on 01/16/2017 11/30/16   Hyatt, Max T, DPM  nitrofurantoin, macrocrystal-monohydrate, (MACROBID) 100 MG capsule Take 1 capsule by mouth daily. 01/06/17   [provider]  nitroGLYCERIN (NITROSTAT) 0.4 MG SL tablet Place 1 tablet (0.4 mg total) under the tongue every 5 (five) minutes as needed. For chest pain 01/11/17   Ahmed Prima, [place] M, PA-C  ondansetron (ZOFRAN) 4 MG tablet Take 1 tablet (4 mg total) by mouth every 8 (eight) hours as needed for nausea or vomiting. 01/16/17   Tegeler, Gwenyth Allegra, MD  pantoprazole (PROTONIX) 40 MG tablet Take 1 tablet (40 mg total) by mouth daily. 04/03/13   Hilty, Nadean Corwin, MD  polyethylene glycol powder (GLYCOLAX/MIRALAX) powder Take 17 g by mouth daily. [place]    [provider]  pravastatin (  PRAVACHOL) 20 MG tablet Take 20 mg by mouth at bedtime.    [provider]  predniSONE (DELTASONE) 10 MG tablet Take 1 tablet (10 mg total) by mouth daily with breakfast. 10/11/16   Charlynne Cousins, MD  Probiotic Product (PROBIOTIC PO) Take 1 capsule by mouth daily.    [provider]  SPS 15 GM/60ML suspension Take 15 g by mouth once. 01/12/17   [provider]  Tiotropium Bromide Monohydrate (SPIRIVA RESPIMAT) 2.5 MCG/ACT AERS Inhale 2 puffs into the lungs every morning.     [provider]  traMADol (ULTRAM) 50 MG tablet Take 100 mg by mouth every 6 (six) hours as needed for pain.  10/25/16   [provider]  traMADol (ULTRAM-ER) 300 MG 24 hr tablet Take 300 mg by mouth every morning. 11/01/16   [provider]  Vitamin D, Ergocalciferol, (DRISDOL) 50000 UNITS CAPS capsule Take 1 capsule (50,000 Units total) by mouth every 7 (seven) days. 08/27/13   Kinnie Feil, MD    Family History Family History  Problem Relation Age of Onset  . Diabetes Mother    . Heart disease Mother   . Heart disease Father   . Arthritis Father   . Healthy Sister   . Heart disease Brother   . Cancer Brother        colon ca  . Osteoporosis Sister   . Heart disease Brother   . Diabetes Brother   . Heart disease Brother   . Diabetes Brother   . Diabetes Brother   . Healthy Brother   . Healthy Brother   . Healthy Brother   . Cancer Maternal Uncle        blood condition  . Cancer Daughter        breast ca  . Cancer Cousin        NHL    Social History Social History  Substance Use Topics  . Smoking status: Former Smoker    Packs/day: 1.00    Years: 35.00    Quit date: 04/14/2004  . Smokeless tobacco: Never Used  . Alcohol use No     Allergies   Levaquin [levofloxacin] and Tape   Review of Systems Review of Systems  All other systems reviewed and are negative.    Physical Exam Updated Vital Signs BP 98/63 (BP Location: Left Arm)   Pulse 74   Temp 98 F (36.7 C) (Oral)   Resp 14   SpO2 99%   Physical Exam  Constitutional: She is oriented to person, place, and time. She appears well-developed and well-nourished.  Non-toxic appearance. No distress.  HENT:  Head: Normocephalic and atraumatic.  Eyes: Pupils are equal, round, and reactive to light. Conjunctivae, EOM and lids are normal.  Neck: Normal range of motion. Neck supple. No tracheal deviation present. No thyroid mass present.  Cardiovascular: Normal rate, regular rhythm and normal heart sounds.  Exam reveals no gallop.   No murmur heard. Pulmonary/Chest: Effort normal and breath sounds normal. No stridor. No respiratory distress. She has no decreased breath sounds. She has no wheezes. She has no rhonchi. She has no rales.  Abdominal: Soft. Normal appearance and bowel sounds are normal. She exhibits no distension. There is no tenderness. There is no rebound and no CVA tenderness.  Musculoskeletal: Normal range of motion. She exhibits no edema or tenderness.  Neurological: She  is alert and oriented to person, place, and time. She has normal strength. No cranial nerve deficit or sensory deficit.  GCS eye subscore is 4. GCS verbal subscore is 5. GCS motor subscore is 6.  Skin: Skin is warm and dry. No abrasion and no rash noted.  Psychiatric: She has a normal mood and affect. Her speech is normal and behavior is normal.  Nursing note and vitals reviewed.    ED Treatments / Results  Labs (all labs ordered are listed, but only abnormal results are displayed) Labs Reviewed  CBG MONITORING, ED - Abnormal; Notable for the following:       Result Value   Glucose-Capillary 184 (*)    All other components within normal limits  URINE CULTURE  PROTIME-INR  APTT  CBC  DIFFERENTIAL  COMPREHENSIVE METABOLIC PANEL  URINALYSIS, ROUTINE W REFLEX MICROSCOPIC  BRAIN NATRIURETIC PEPTIDE  I-STAT TROPONIN, ED  CBG MONITORING, ED  I-STAT CHEM 8, ED    EKG  EKG Interpretation  Date/Time:  Tuesday January 25 2017 15:33:40 EDT Ventricular Rate:  74 PR Interval:    QRS Duration: 90 QT Interval:  379 QTC Calculation: 421 R Axis:   19 Text Interpretation:  Sinus rhythm No significant change since last tracing Confirmed by Lacretia Leigh ([id number]) on 01/25/2017 5:16:28 PM       Radiology No results found.  Procedures Procedures (including critical care time)  Medications Ordered in ED Medications - No data to display   Initial Impression / Assessment and Plan / ED Course  I have reviewed the triage vital signs and the nursing notes.  Pertinent labs & imaging results that were available during my care of the patient were reviewed by me and considered in my medical decision making (see chart for details).     Patient has no focal neurological deficits on exam here.she possibly had another TIA. She has been complaining of weakness and has a history of pneumonia and a chest x-ray today is consistent with infiltrate. Will start antibiotics and admitted for further  management  Final Clinical Impressions(s) / ED Diagnoses   Final diagnoses:  SOB (shortness of breath)    New Prescriptions New Prescriptions   No medications on file     Lacretia Leigh, MD 01/25/17 1725

## 2017-01-25 NOTE — H&P (Addendum)
[facility] History and Physical  Rebecca [name] NWG:[id number] DOB: 1941-03-05 DOA: 01/25/2017  Referring physician:  PCP: [name], MD   Chief Complaint:  Generalized weakness  HPI:   76 year old female with a history of CAD (s/p DES to RCA in 2006, low-risk NST in 2014, cath in 08/2015 showing patent RCA stent with nonobstructive disease), chronic diastolic CHF, HTN, HLD, Stage 3 CKD, MGUS, PMR and prior CVA who presents to ED today for generalized weakness, headache, fatigue. Patient recently had a CT on 01/16/17 that showed a 2.8 cm pancreatic lesion for which she was supposed to have an EUS guided biopsy. Patient was advised to hold her Plavix for this procedure one week prior to procedure ,this is scheduled for 8/30. Earlier today a family noticed the patient was generally weak with left-sided facial droop, noted drooling at around 2 PM. Smile was asymmetric. ED course BP 98/63 (BP Location: Left Arm)   Pulse 74   Temp 98 F (36.7 C) (Oral)   Resp 14   SpO2 99%  CBG 184, EKG normal sinus rhythm. Chest x-ray shows opacity in the left lung base, possible pneumonia CT head shows atrophy with patchy supratentorial small vessel disease. No acute infarct White blood cell count 21.4, creatinine 1.57, sodium 128, potassium 5.0, lactic acid 1.25 Patient is being medically for possible pneumonia, possible stroke workup   Review of Systems: A complete 12 point review of systems was done, pertinent positives in history of present illness, otherwise negative       Past Medical History:  Diagnosis Date  . AKI (acute kidney injury) ([place])   . Anemia in chronic renal disease   . CAD (coronary artery disease) 2006   a. s/p DES to RCA in 2006 b. low-risk NST in 2014 c. cath in 08/2015 showing patent RCA stent with nonobstructive disease  . CKD (chronic kidney disease) stage 4, GFR 15-29 ml/min (HCC)   . Complication of anesthesia   . COPD (chronic obstructive pulmonary disease)  ([place])   . Diabetes mellitus   . Diastolic heart failure, NYHA class 1 ([place])   . Dyslipidemia   . Dysuria 01/06/2015  . Frozen shoulder    right  . Gastroesophageal reflux   . HAP (hospital-acquired pneumonia)   . Headache   . Hyperkalemia 01/01/2016  . Hypothyroidism   . 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 Obesity   . Pneumonia 06/2016  . Polymyalgia rheumatica ([place])   . PONV (postoperative nausea and vomiting)   . Renal insufficiency   . Respiratory distress   . Schizoaffective disorder ([place])   . Sepsis due to pneumonia ([place])   . Shortness of breath   . Stroke [facility])      Past Surgical History:  Procedure Laterality Date  . ABDOMINAL HYSTERECTOMY    . CARDIAC CATHETERIZATION N/A 08/13/2015   Procedure: Left Heart Cath and Coronary Angiography;  Surgeon: Jettie Booze, MD;  Location: [place] [facility];  Service: Cardiovascular;  Laterality: N/A;  . CATARACT EXTRACTION    . CHOLECYSTECTOMY    . CORONARY ANGIOPLASTY WITH STENT PLACEMENT  2006   [place]us DES to RCA, 50 % residual  . PARTIAL HYSTERECTOMY  1995      Social History:  reports that she quit smoking about 12 years ago. She has a 35.00 pack-year smoking history. She has never used smokeless tobacco. She reports that she does not drink alcohol or use drugs.    Allergies  Allergen Reactions  .  Levaquin [Levofloxacin] Other (See Comments)    Ruptured tendon  . Tape Other (See Comments)    PATIENT'S SKIN IS VERY, VERY THIN AND WILL TEAR AND BRUISE EASILY; PLEASE USE AN ALTERNATIVE (SOMETHING OTHER THAN TAPE)    Family History  Problem Relation Age of Onset  . Diabetes Mother   . Heart disease Mother   . Heart disease Father   . Arthritis Father   . Healthy Sister   . Heart disease Brother   . Cancer Brother        colon ca  . Osteoporosis Sister   . Heart disease Brother   . Diabetes Brother   . Heart disease Brother   . Diabetes Brother   . Diabetes Brother   .  Healthy Brother   . Healthy Brother   . Healthy Brother   . Cancer Maternal Uncle        blood condition  . Cancer Daughter        breast ca  . Cancer Cousin        NHL        Prior to Admission medications   Medication Sig Start Date End Date Taking? Authorizing Provider  acetaminophen (TYLENOL) 500 MG tablet Take 1 tablet (500 mg total) by mouth every 6 (six) hours as needed (pain). Patient taking differently: Take 1,000 mg by mouth 2 (two) times daily as needed for moderate pain.  11/21/16  Yes Arrien, Jimmy Picket, MD  cetirizine (ZYRTEC) 10 MG tablet Take 10 mg by mouth at bedtime.    Yes [provider]  clopidogrel (PLAVIX) 75 MG tablet Take 1 tablet (75 mg total) by mouth daily with breakfast. Patient taking differently: Take 75 mg by mouth at bedtime.  10/18/13  Yes Thurnell Lose, MD  cloZAPine (CLOZARIL) 100 MG tablet Take 300 mg by mouth at bedtime. 11/04/16  Yes [provider]  Cranberry 250 MG TABS Take 1 tablet by mouth at bedtime.   Yes [provider]  denosumab (PROLIA) 60 MG/ML SOLN injection Inject 60 mg into the skin every 6 (six) months. Administer in upper arm, thigh, or abdomen   Yes [provider]  diltiazem (CARDIZEM LA) 180 MG 24 hr tablet Take 180 mg by mouth daily with breakfast. 07/16/15  Yes [provider]  docusate sodium (COLACE) 100 MG capsule Take 200 mg by mouth at bedtime.  03/01/12  Yes Katherina Mires, MD  Fluticasone-Salmeterol (ADVAIR) 250-50 MCG/DOSE AEPB Inhale 1 puff into the lungs at bedtime.   Yes [provider]  furosemide (LASIX) 20 MG tablet Take 1 tablet (20 mg total) by mouth daily as needed. Patient taking differently: Take 20 mg by mouth daily as needed for fluid or edema.  12/02/16 03/02/17 Yes Hilty, Nadean Corwin, MD  ipratropium-albuterol (DUONEB) 0.5-2.5 (3) MG/3ML SOLN Inhale 3 mLs into the lungs 3 (three) times daily.  11/05/16  Yes [provider]  isosorbide mononitrate  (IMDUR) 60 MG 24 hr tablet Take 1 tablet (60 mg total) by mouth daily. 03/14/13  Yes Isaiah Serge, NP  levothyroxine (SYNTHROID, LEVOTHROID) 75 MCG tablet Take 75 mcg by mouth daily before breakfast.   Yes [provider]  metoprolol succinate (TOPROL-XL) 100 MG 24 hr tablet Take 100 mg by mouth daily with breakfast. Take with or immediately following a meal.   Yes [provider]  Multiple Vitamins-Minerals (MULTIVITAMIN WITH MINERALS) tablet Take 1 tablet by mouth at bedtime.    Yes [provider]  nitrofurantoin (MACRODANTIN) 100 MG capsule Take 100 mg by mouth at bedtime. Filled 01/11/17 x 30 days 01/11/17  Yes [provider]  nitroGLYCERIN (NITROSTAT) 0.4 MG SL tablet Place 1 tablet (0.4 mg total) under the tongue every 5 (five) minutes as needed. For chest pain 01/11/17  Yes Strader, [place] M, PA-C  ondansetron (ZOFRAN) 4 MG tablet Take 1 tablet (4 mg total) by mouth every 8 (eight) hours as needed for nausea or vomiting. 01/16/17  Yes Tegeler, Gwenyth Allegra, MD  pantoprazole (PROTONIX) 40 MG tablet Take 1 tablet (40 mg total) by mouth daily. 04/03/13  Yes Hilty, Nadean Corwin, MD  polyethylene glycol powder (GLYCOLAX/MIRALAX) powder Take 17 g by mouth daily. [place]   Yes [provider]  pravastatin (PRAVACHOL) 20 MG tablet Take 20 mg by mouth at bedtime.   Yes [provider]  predniSONE (DELTASONE) 10 MG tablet Take 1 tablet (10 mg total) by mouth daily with breakfast. 10/11/16  Yes Charlynne Cousins, MD  Probiotic Product (PROBIOTIC PO) Take 1 capsule by mouth daily.   Yes [provider]  Tiotropium Bromide Monohydrate (SPIRIVA RESPIMAT) 2.5 MCG/ACT AERS Inhale 2 puffs into the lungs every morning.    Yes [provider]  traMADol (ULTRAM) 50 MG tablet Take 100 mg by mouth 2 (two) times daily as needed for pain. 12/01/16  Yes [provider]  traMADol (ULTRAM-ER) 300 MG 24 hr tablet Take 300 mg by mouth  every morning. 11/01/16  Yes [provider]  Vitamin D, Ergocalciferol, (DRISDOL) 50000 UNITS CAPS capsule Take 1 capsule (50,000 Units total) by mouth every 7 (seven) days. 08/27/13  Yes Kinnie Feil, MD  NEOMYCIN-POLYMYXIN-HYDROCORTISONE (CORTISPORIN) 1 % SOLN OTIC solution Apply 1-2 drops to toe BID after soaking Patient not taking: Reported on 01/16/2017 11/30/16   Garrel Ridgel, [place]     Physical Exam: Vitals:   01/25/17 1535 01/25/17 1630 01/25/17 1700  BP: 98/63 118/65 (!) 119/58  Pulse: 74 67 66  Resp: 14 15 14   Temp: 98 F (36.7 C)    TempSrc: Oral    SpO2: 99% 97% 97%        Vitals:   01/25/17 1535 01/25/17 1630 01/25/17 1700  BP: 98/63 118/65 (!) 119/58  Pulse: 74 67 66  Resp: 14 15 14   Temp: 98 F (36.7 C)    TempSrc: Oral    SpO2: 99% 97% 97%   Constitutional: Currently appears to be chronically ill appearing, weak Eyes: PERRL, lids and conjunctivae normal ENMT: Mucous membranes are moist. Posterior pharynx clear of any exudate or lesions.Normal dentition.  Neck: normal, supple, no masses, no thyromegaly Respiratory: clear to auscultation bilaterally, no wheezing, no crackles. Normal respiratory effort. No accessory muscle use.  Cardiovascular: Regular rate and rhythm, no murmurs / rubs / gallops. No extremity edema. 2+ pedal pulses. No carotid bruits.  Abdomen: no tenderness, no masses palpated. No hepatosplenomegaly. Bowel sounds positive.  Musculoskeletal: no clubbing / cyanosis. No joint deformity upper and lower extremities. Good ROM, no contractures. Normal muscle tone.  Skin: no rashes, lesions, ulcers. No induration Neurologic: Slight facial droop on the left, CN 2-12 grossly intact. Sensation intact, DTR normal. Strength 5/5 in all 4.  Psychiatric: Normal judgment and insight. Alert and oriented x 3. Normal mood.     Labs on Admission: I have personally reviewed following labs and imaging studies  CBC:  Recent Labs Lab 01/25/17 1644  01/25/17 1657  WBC 21.4*  --   NEUTROABS  19.5*  --   HGB 9.7* 9.5*  HCT 28.4* 28.0*  MCV 87.7  --   PLT 228  --     Basic Metabolic Panel:  Recent Labs Lab 01/25/17 1644 01/25/17 1657  NA 128* 125*  K 5.0 4.9  CL 94* 91*  CO2 27  --   GLUCOSE 175* 170*  BUN 33* 30*  CREATININE 1.57* 1.50*  CALCIUM 9.4  --     GFR: CrCl cannot be calculated (Unknown ideal weight.).  Liver Function Tests:  Recent Labs Lab 01/25/17 1644  AST 14*  ALT 12*  ALKPHOS 38  BILITOT 0.3  PROT 6.2*  ALBUMIN 3.3*   No results for input(s): LIPASE, AMYLASE in the last 168 hours. No results for input(s): AMMONIA in the last 168 hours.  Coagulation Profile:  Recent Labs Lab 01/25/17 1644  INR 0.95   No results for input(s): DDIMER in the last 72 hours.  Cardiac Enzymes: No results for input(s): CKTOTAL, [name], [name], TROPONINI in the last 168 hours.  BNP (last 3 results) No results for input(s): PROBNP in the last 8760 hours.  HbA1C: No results for input(s): HGBA1C in the last 72 hours. Lab Results  Component Value Date   HGBA1C 5.7 (H) 10/17/2013   HGBA1C 5.8 06/28/2013   HGBA1C 5.5 01/02/2013     CBG:  Recent Labs Lab 01/25/17 1540  GLUCAP 184*    Lipid Profile: No results for input(s): CHOL, [name], [name], [name], [name], LDLDIRECT in the last 72 hours.  Thyroid Function Tests: No results for input(s): TSH, T4TOTAL, FREET4, T3FREE, THYROIDAB in the last 72 hours.  Anemia Panel: No results for input(s): VITAMINB12, FOLATE, FERRITIN, TIBC, IRON, RETICCTPCT in the last 72 hours.  Urine analysis:    Component Value Date/Time   COLORURINE YELLOW 01/16/2017 2026   APPEARANCEUR HAZY (A) 01/16/2017 2026   LABSPEC 1.009 01/16/2017 2026   LABSPEC 1.015 01/06/2015 1542   PHURINE 6.0 01/16/2017 2026   GLUCOSEU NEGATIVE 01/16/2017 2026   GLUCOSEU Negative 01/06/2015 1542   HGBUR NEGATIVE 01/16/2017 2026   HGBUR negative 12/03/2008 [place]  01/16/2017 2026   BILIRUBINUR Negative 01/06/2015 [place] 01/16/2017 2026   PROTEINUR NEGATIVE 01/16/2017 2026   UROBILINOGEN 0.2 01/06/2015 1542   NITRITE NEGATIVE 01/16/2017 2026   LEUKOCYTESUR TRACE (A) 01/16/2017 2026   LEUKOCYTESUR Negative 01/06/2015 1542    Sepsis Labs: @LABRCNTIP (procalcitonin:4,lacticidven:4) ) Recent Results (from the past 240 hour(s))  Urine culture     Status: Abnormal   Collection Time: 01/16/17  8:26 PM  Result Value Ref Range Status   Specimen Description URINE, RANDOM  Final   Special Requests NONE  Final   Culture MULTIPLE SPECIES PRESENT, SUGGEST RECOLLECTION (A)  Final   Report Status 01/18/2017 FINAL  Final         Radiological Exams on Admission: Dg Chest 2 View  Result Date: 01/25/2017 CLINICAL DATA:  Headache.  Lethargy. EXAM: CHEST  2 VIEW COMPARISON:  01/16/2017 FINDINGS: The heart size is normal. Persistent left lung base opacity compatible with atelectasis and/or pneumonia. Right lung is clear. No pleural effusions. IMPRESSION: 1. Persistent left lung base opacity which may represent atelectasis or pneumonia. Electronically Signed   By: Kerby Moors M.D.   On: 01/25/2017 16:23   Ct Head Wo Contrast  Result Date: 01/25/2017 CLINICAL DATA:  Lethargy and headache. Left-sided facial droop, transient EXAM: CT HEAD WITHOUT CONTRAST TECHNIQUE: Contiguous axial images were obtained from the base of the  skull through the vertex without intravenous contrast. COMPARISON:  Oct 19, 2016 FINDINGS: Brain: There is mild diffuse atrophy. There is no intracranial mass, hemorrhage, extra-axial fluid collection, or midline shift. There is patchy small vessel disease in the centra semiovale bilaterally, stable. There is no new gray-white compartment lesion. No acute infarct evident. Vascular: There is no appreciable hyperdense vessel. There is calcification in each carotid siphon region. Skull: Bony calvarium appears intact. Sinuses/Orbits:  Visualized paranasal sinuses are clear. Visualized orbits appear symmetric bilaterally. Other: There is opacification in several inferior mastoid air cells on the left, stable. Mastoids elsewhere are clear. IMPRESSION: Atrophy with stable patchy supratentorial small vessel disease. No intracranial mass, hemorrhage, or extra-axial fluid collection. No acute infarct evident. There are foci of arterial vascular calcification. There is mild chronic inferior mastoid air cell disease on the left, stable. Electronically Signed   By: Lowella Grip III M.D.   On: 01/25/2017 16:31   Ct Abdomen Pelvis Wo Contrast  Result Date: 01/16/2017 CLINICAL DATA:  Pt is c/o diffuse abdominal pain associated with distension and a hx of constipation and bowel obstruction. Adds that she has multiple episodes of emesis and family administered an enema this morning after which she had a significant bowel movement EXAM: CT ABDOMEN AND PELVIS WITHOUT CONTRAST TECHNIQUE: Multidetector CT imaging of the abdomen and pelvis was performed following the standard protocol without IV contrast. COMPARISON:  10/07/2016 FINDINGS: Lower chest: There is patchy airspace filling opacity in the left lower lobe, bibasilar scarring is again identified, left greater than right. No new or suspicious findings in the lung bases. Coronary artery calcifications and stent are present. Hepatobiliary: Status post cholecystectomy. The liver is homogeneous. Pancreas: No pancreatic ductal dilatation or surrounding inflammatory changes. There is a fluid attenuation mass within the pancreatic body measuring 2.8 x 2.2 cm. Mass appears stable compared to the most recent study but demonstrates growth since study in 2013. Spleen: Normal in size without focal abnormality. Adrenals/Urinary Tract: The adrenal glands are normal. There are bilateral renal cysts. No hydronephrosis or ureteral obstruction. Urinary bladder is normal in appearance. Stomach/Bowel: The stomach is  normal in appearance. Small bowel loops are normal in appearance. The appendix is well seen and has a normal appearance. There is marked distension of the entire colon by solid stool. The colon is tortuous and distended to level of the cecum. Vascular/Lymphatic: There is atherosclerotic calcification of the abdominal aorta not associated with aneurysm. There is significant calcified mural thrombus, stable in appearance and best seen on image 21 of series 2. No retroperitoneal or mesenteric adenopathy. Reproductive: Status post hysterectomy.  No adnexal mass. Other: None Musculoskeletal: Significant degenerative changes in the lower thoracic in lumbar spine. There is 5 mm retrolisthesis associated with significant disc height loss at L2-3. Vacuum disc identified at L4-5 and L5-S1. No suspicious lytic or blastic lesions are identified. IMPRESSION: 1. Distended tortuous colon containing a large amount of stool, likely related to the patient's symptoms of abdominal pain. 2. 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 By consensus criteria, referral for possible endoscopic ultrasound /FNA and surgical consultation is recommended. 3. Scarring at both lung bases. 4. Coronary artery disease. 5. Status post cholecystectomy. 6.  Aortic atherosclerosis.  (ICD10-I70.0) Electronically Signed   By: Nolon Nations M.D.   On: 01/16/2017 20:43   Dg Chest 2 View  Result Date: 01/25/2017 CLINICAL DATA:  Headache.  Lethargy. EXAM: CHEST  2 VIEW COMPARISON:  01/16/2017 FINDINGS: The heart  size is normal. Persistent left lung base opacity compatible with atelectasis and/or pneumonia. Right lung is clear. No pleural effusions. IMPRESSION: 1. Persistent left lung base opacity which may represent atelectasis or pneumonia. Electronically Signed   By: Kerby Moors M.D.   On: 01/25/2017 16:23   Dg Chest 2 View  Result Date: 01/16/2017 CLINICAL DATA:  Diffuse abdominal pain associated  with distention. EXAM: CHEST  2 VIEW COMPARISON:  11/19/2016 CXR, 12/08/2016 CT FINDINGS: The heart size and mediastinal contours are within normal limits. Aortic atherosclerosis at the arch without aneurysmal dilatation. Bibasilar atelectasis is noted. No overt pulmonary edema. 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 No acute osseous abnormality. IMPRESSION: 1. Left basilar atelectasis. 2. Aortic atherosclerosis. 3. Bilateral AC and glenohumeral joint osteoarthritis with small ossific loose body in the left axillary pouch. Electronically Signed   By: Ashley Royalty M.D.   On: 01/16/2017 19:17   Ct Head Wo Contrast  Result Date: 01/25/2017 CLINICAL DATA:  Lethargy and headache. Left-sided facial droop, transient EXAM: CT HEAD WITHOUT CONTRAST TECHNIQUE: Contiguous axial images were obtained from the base of the skull through the vertex without intravenous contrast. COMPARISON:  Oct 19, 2016 FINDINGS: Brain: There is mild diffuse atrophy. There is no intracranial mass, hemorrhage, extra-axial fluid collection, or midline shift. There is patchy small vessel disease in the centra semiovale bilaterally, stable. There is no new gray-white compartment lesion. No acute infarct evident. Vascular: There is no appreciable hyperdense vessel. There is calcification in each carotid siphon region. Skull: Bony calvarium appears intact. Sinuses/Orbits: Visualized paranasal sinuses are clear. Visualized orbits appear symmetric bilaterally. Other: There is opacification in several inferior mastoid air cells on the left, stable. Mastoids elsewhere are clear. IMPRESSION: Atrophy with stable patchy supratentorial small vessel disease. No intracranial mass, hemorrhage, or extra-axial fluid collection. No acute infarct evident. There are foci of arterial vascular calcification. There is mild chronic inferior mastoid air cell disease on the left, stable.  Electronically Signed   By: Lowella Grip III M.D.   On: 01/25/2017 16:31      EKG: Independently reviewed. Normal sinus rhythm  Assessment/Plan Principal Problem:   Hyponatremia Could be secondary to SIADH the setting of presumed pneumonia versus adrenal insufficiency and she is chronically on prednisone, [ will check TSH, cortisol,  ] Would also be related to dehydration We'll start patient on aggressive fluid resuscitation, Serial BMET    Leukocytosis  Chronically elevated since April 2018 Continue empiric  abx for PNA  She has a hx of chronic aspiration ,advised to be on apureed diet  Generalized weakness, facial droop Was recently advised to hold Plavix, Will resume, MRI of the brain to rule out acute CVA If positive. Workup would be initiated  Acute kidney injury on CKD stage 4, GFR 15-29 ml/min : Pre-renal etiology, hopefully will improve with IV fluid hydration.       Dyslipidemia-Continue statin    Anemia in chronic renal disease/ anemia of chronic disease, baseline hemoglobin around 9.5 at baseline, hemoglobin at baseline    GASTROESOPHAGEAL REFLUX, NO ESOPHAGITIS-Continue PPI    Polymyalgia rheumatica (HCC)-Continue prednisone, may need stress dose steroids     COPD exacerbation (HCC)-Continue nebulizer treatments,    History of TIA - May 2015-  resume Plavix, MRI of the brain to rule out acute CVA    Coronary artery disease involving native coronary artery of native heart without angina pectoris-Continue to cycle cardiac enzymes,  follows Dr. Debara Pickett  Essential hypertension-continue Cardizem, metoprolol, will hold imdur  low blood pressure   DVT prophylaxis: Heparin     Code Status Orders full code         Start     Ordered   01/25/17 1736  Full code  Continuous     01/25/17 1737     consults called: none   Family Communication: Admission, patients condition and plan of care including tests being ordered have been discussed with the patient   who indicates understanding and agree with the plan and Code Status  Admission status: inpatient   Disposition plan: Further plan will depend as patient's clinical course evolves and further radiologic and laboratory data become available. Likely home when stable   At the time of admission, it appears that the appropriate admission status for this patient is INPATIENT .Thisis judged to be reasonable and necessary in order to provide the required intensity of service to ensure the patient's safetygiven thepresenting symptoms, physical exam findings, and initial radiographic and laboratory data in the context of their chronic comorbidities.   Reyne Dumas MD [facility] Pager [phone]  If 7PM-7AM, please contact night-coverage [email or web address] Password [email or web address]  01/25/2017, 5:39 PM

## 2017-01-25 NOTE — ED Notes (Signed)
ED TO INPATIENT HANDOFF REPORT  Name/Age/Gender Rebecca [name] 76 y.o. female  Home/SNF/Other Home  Chief Complaint weakness      Code Status Orders        Start     Ordered   01/25/17 1736  Full code  Continuous     01/25/17 1737    Code Status History    Date Active Date Inactive Code Status Order ID Comments User Context   11/19/2016 11:37 PM 11/21/2016  7:11 PM Full Code [id number]  Bethena Roys, MD Inpatient   10/07/2016 12:35 PM 10/10/2016  7:04 PM Full Code [id number]  Robbie Lis, MD ED   09/12/2016  3:10 PM 09/16/2016  8:16 PM Full Code [id number]  [place], [place], DO ED   09/12/2016  3:10 PM 09/12/2016  3:10 PM Full Code [id number]  [place], [place], DO ED   07/14/2016  8:48 PM 07/19/2016  7:40 PM Full Code [id number]  Robbie Lis, MD ED   08/11/2015  2:26 AM 08/14/2015  7:56 PM Full Code [id number]  Rise Patience, MD Inpatient   08/11/2015  1:26 AM 08/11/2015  2:26 AM Full Code [id number]  Rise Patience, MD Inpatient   08/11/2015  1:04 AM 08/11/2015  1:26 AM DNR [id number]  Rise Patience, MD Inpatient   10/16/2013  8:14 PM 10/18/2013  8:21 PM Full Code [id number]  Shanda Howells, MD ED   07/14/2012  9:04 AM 07/15/2012  3:17 PM Full Code [id number]  [place], [place], MD Inpatient    Advance Directive Documentation     Most Recent Value  Type of Advance Directive  Healthcare Power of Attorney  Pre-existing out of facility DNR order (yellow form or pink MOST form)  -  "MOST" Form in Place?  -      Level of Care/Admitting Diagnosis ED Disposition    ED Disposition Condition [facility] Area: [place] [100102]  Level of Care: Telemetry [5]  Admit to tele based on following criteria: Eval of Syncope  Diagnosis: Generalized weakness [842103]  Admitting Physician: Reyne Dumas [[id number]]  Attending Physician: Reyne Dumas [[id number]]  Estimated length of stay: past midnight tomorrow  Certification:: I certify this patient will need inpatient  services for at least 2 midnights  PT Class (Do Not Modify): Inpatient [101]  PT Acc Code (Do Not Modify): Private [1]       Medical History Past Medical History:  Diagnosis Date  . AKI (acute kidney injury) ([place])   . Anemia in chronic renal disease   . CAD (coronary artery disease) 2006   a. s/p DES to RCA in 2006 b. low-risk NST in 2014 c. cath in 08/2015 showing patent RCA stent with nonobstructive disease  . CKD (chronic kidney disease) stage 4, GFR 15-29 ml/min (H[facility])   . Complication of anesthesia   . COPD (chronic obstructive pulmonary disease) ([place])   . Diabetes mellitus   . Diastolic heart failure, NYHA class 1 ([place])   . Dyslipidemia   . Dysuria 01/06/2015  . Frozen shoulder    right  . Gastroesophageal reflux   . HAP (hospital-acquired pneumonia)   . Headache   . Hyperkalemia 01/01/2016  . Hypothyroidism   . 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 Obesity   . Pneumonia 06/2016  . Polymyalgia rheumatica ([place])   . PONV (postoperative nausea and vomiting)   . Renal insufficiency   . Respiratory distress   . Schizoaffective disorder ([place])   .  Sepsis due to pneumonia ([place])   . Shortness of breath   . Stroke [facility])     Allergies Allergies  Allergen Reactions  . Levaquin [Levofloxacin] Other (See Comments)    Ruptured tendon  . Tape Other (See Comments)    PATIENT'S SKIN IS VERY, VERY THIN AND WILL TEAR AND BRUISE EASILY; PLEASE USE AN ALTERNATIVE (SOMETHING OTHER THAN TAPE)    IV Location/Drains/Wounds Patient Lines/Drains/Airways Status   Active Line/Drains/Airways    Name:   Placement date:   Placement time:   Site:   Days:   Peripheral IV 01/25/17 Right Forearm  01/25/17    1740    Forearm    less than 1   Pressure Injury 07/14/16 Deep Tissue Injury - Purple or maroon localized area of discolored intact skin or blood-filled blister due to damage of underlying soft tissue from pressure and/or shear.  07/14/16    2130      195    Pressure Injury 09/12/16 Stage II -  Partial thickness loss of dermis presenting as a shallow open ulcer with a red, pink wound bed without slough.  09/12/16    2240      135   Wound / Incision (Open or Dehisced) 09/12/16 Other (Comment) Leg Right below knee  09/12/16    1725    Leg    135          Labs/Imaging Results for orders placed or performed during the hospital encounter of 01/25/17 (from the past 48 hour(s))  CBG monitoring, ED     Status: Abnormal   Collection Time: 01/25/17  3:40 PM  Result Value Ref Range   Glucose-Capillary 184 (H) 65 - 99 mg/dL  Protime-INR     Status: None   Collection Time: 01/25/17  4:44 PM  Result Value Ref Range   Prothrombin Time 12.7 11.4 - 15.2 seconds   INR 0.95   APTT     Status: None   Collection Time: 01/25/17  4:44 PM  Result Value Ref Range   aPTT 30 24 - 36 seconds  CBC     Status: Abnormal   Collection Time: 01/25/17  4:44 PM  Result Value Ref Range   WBC 21.4 (H) 4.0 - 10.5 K/uL   RBC 3.24 (L) 3.87 - 5.11 MIL/uL   Hemoglobin 9.7 (L) 12.0 - 15.0 g/dL   HCT 28.4 (L) 36.0 - 46.0 %   MCV 87.7 78.0 - 100.0 fL   MCH 29.9 26.0 - 34.0 pg   MCHC 34.2 30.0 - 36.0 g/dL   RDW 13.0 11.5 - 15.5 %   Platelets 228 150 - 400 K/uL  Differential     Status: Abnormal   Collection Time: 01/25/17  4:44 PM  Result Value Ref Range   Neutrophils Relative % 91 %   Neutro Abs 19.5 (H) 1.7 - 7.7 K/uL   Lymphocytes Relative 5 %   Lymphs Abs 1.1 0.7 - 4.0 K/uL   Monocytes Relative 3 %   Monocytes Absolute 0.7 0.1 - 1.0 K/uL   Eosinophils Relative 1 %   Eosinophils Absolute 0.2 0.0 - 0.7 K/uL   Basophils Relative 0 %   Basophils Absolute 0.0 0.0 - 0.1 K/uL  Comprehensive metabolic panel     Status: Abnormal   Collection Time: 01/25/17  4:44 PM  Result Value Ref Range   Sodium 128 (L) 135 - 145 mmol/L   Potassium 5.0 3.5 - 5.1 mmol/L   Chloride 94 (L) 101 - 111 mmol/L  CO2 27 22 - 32 mmol/L   Glucose, Bld 175 (H) 65 - 99 mg/dL   BUN 33 (H) 6 -  20 mg/dL   Creatinine, Ser 1.57 (H) 0.44 - 1.00 mg/dL   Calcium 9.4 8.9 - 10.3 mg/dL   Total Protein 6.2 (L) 6.5 - 8.1 g/dL   Albumin 3.3 (L) 3.5 - 5.0 g/dL   AST 14 (L) 15 - 41 U/L   ALT 12 (L) 14 - 54 U/L   Alkaline Phosphatase 38 38 - 126 U/L   Total Bilirubin 0.3 0.3 - 1.2 mg/dL   GFR calc non Af Amer 31 (L) >60 mL/min   GFR calc Af Amer 36 (L) >60 mL/min    Comment: (NOTE) The eGFR has been calculated using the CKD EPI equation. This calculation has not been validated in all clinical situations. eGFR's persistently <60 mL/min signify possible Chronic Kidney Disease.    Anion gap 7 5 - 15  Brain natriuretic peptide     Status: Abnormal   Collection Time: 01/25/17  4:44 PM  Result Value Ref Range   B Natriuretic Peptide 109.5 (H) 0.0 - 100.0 pg/mL  I-stat troponin, ED     Status: None   Collection Time: 01/25/17  4:56 PM  Result Value Ref Range   Troponin i, poc 0.00 0.00 - 0.08 ng/mL   Comment 3            Comment: Due to the release kinetics of cTnI, a negative result within the first hours of the onset of symptoms does not rule out myocardial infarction with certainty. If myocardial infarction is still suspected, repeat the test at appropriate intervals.   I-Stat Chem 8, ED     Status: Abnormal   Collection Time: 01/25/17  4:57 PM  Result Value Ref Range   Sodium 125 (L) 135 - 145 mmol/L   Potassium 4.9 3.5 - 5.1 mmol/L   Chloride 91 (L) 101 - 111 mmol/L   BUN 30 (H) 6 - 20 mg/dL   Creatinine, Ser 1.50 (H) 0.44 - 1.00 mg/dL   Glucose, Bld 170 (H) 65 - 99 mg/dL   Calcium, Ion 1.24 1.15 - 1.40 mmol/L   TCO2 25 0 - 100 mmol/L   Hemoglobin 9.5 (L) 12.0 - 15.0 g/dL   HCT 28.0 (L) 36.0 - 46.0 %  I-Stat CG4 Lactic Acid, ED     Status: None   Collection Time: 01/25/17  5:11 PM  Result Value Ref Range   Lactic Acid, Venous 1.25 0.5 - 1.9 mmol/L   Dg Chest 2 View  Result Date: 01/25/2017 CLINICAL DATA:  Headache.  Lethargy. EXAM: CHEST  2 VIEW COMPARISON:  01/16/2017  FINDINGS: The heart size is normal. Persistent left lung base opacity compatible with atelectasis and/or pneumonia. Right lung is clear. No pleural effusions. IMPRESSION: 1. Persistent left lung base opacity which may represent atelectasis or pneumonia. Electronically Signed   By: Kerby Moors M.D.   On: 01/25/2017 16:23   Ct Head Wo Contrast  Result Date: 01/25/2017 CLINICAL DATA:  Lethargy and headache. Left-sided facial droop, transient EXAM: CT HEAD WITHOUT CONTRAST TECHNIQUE: Contiguous axial images were obtained from the base of the skull through the vertex without intravenous contrast. COMPARISON:  Oct 19, 2016 FINDINGS: Brain: There is mild diffuse atrophy. There is no intracranial mass, hemorrhage, extra-axial fluid collection, or midline shift. There is patchy small vessel disease in the centra semiovale bilaterally, stable. There is no new gray-white compartment lesion. No acute infarct evident.  Vascular: There is no appreciable hyperdense vessel. There is calcification in each carotid siphon region. Skull: Bony calvarium appears intact. Sinuses/Orbits: Visualized paranasal sinuses are clear. Visualized orbits appear symmetric bilaterally. Other: There is opacification in several inferior mastoid air cells on the left, stable. Mastoids elsewhere are clear. IMPRESSION: Atrophy with stable patchy supratentorial small vessel disease. No intracranial mass, hemorrhage, or extra-axial fluid collection. No acute infarct evident. There are foci of arterial vascular calcification. There is mild chronic inferior mastoid air cell disease on the left, stable. Electronically Signed   By: Lowella Grip III M.D.   On: 01/25/2017 16:31    Pending Labs Unresulted Labs    Start     Ordered   01/26/17 0500  Comprehensive metabolic panel  Tomorrow morning,   R     01/25/17 1737   01/26/17 0500  CBC  Tomorrow morning,   R     01/25/17 1737   01/25/17 1745  Culture, blood (routine x 2) Call MD if unable to  obtain prior to antibiotics being given  BLOOD CULTURE X 2,   R    Comments:  If blood cultures drawn in Emergency Department - Do not draw and cancel order    01/25/17 1744   01/25/17 1745  Culture, sputum-assessment  Add-on,   R     01/25/17 1744   01/25/17 1745  Gram stain  Once,   R     01/25/17 1744   01/25/17 1745  HIV antibody  Once,   R     01/25/17 1744   01/25/17 1745  Strep pneumoniae urinary antigen  Once,   R     01/25/17 1744   01/25/17 1745  Influenza panel by PCR (type A & B)  (Influenza PCR Panel)  Once,   R     01/25/17 1744   01/25/17 1739  Cortisol  Add-on,   R     01/25/17 1738   01/25/17 1737  Magnesium  Add-on,   R     01/25/17 1737   01/25/17 1737  TSH  Once,   R     01/25/17 1737   01/25/17 1737  Troponin I  Now then every 6 hours,   R     01/25/17 1737   01/25/17 1737  Hemoglobin A1c  Once,   R     01/25/17 1737   01/25/17 1704  Culture, blood (Routine X 2) w Reflex to ID Panel  BLOOD CULTURE X 2,   R     01/25/17 1703   01/25/17 1556  Urinalysis, Routine w reflex microscopic  Once,   R     01/25/17 1555   01/25/17 1556  Urine Culture  STAT,   STAT     01/25/17 1555      Isolation Precautions Droplet precaution  Vitals/Pain Today's Vitals   01/25/17 1630 01/25/17 1700 01/25/17 1730 01/25/17 1800  BP: 118/65 (!) 119/58 111/63 102/61  Pulse: 67 66 66 68  Resp: 15 14 13 16   Temp:      TempSrc:      SpO2: 97% 97% 96% 97%  PainSc:        Medications Medications  cefTRIAXone (ROCEPHIN) 1 g in dextrose 5 % 50 mL IVPB (not administered)  azithromycin (ZITHROMAX) 500 mg in dextrose 5 % 250 mL IVPB (not administered)  heparin injection 5,000 Units (not administered)  0.9 %  sodium chloride infusion (not administered)  acetaminophen (TYLENOL) tablet 650 mg (not administered)  Or  acetaminophen (TYLENOL) suppository 650 mg (not administered)  ondansetron (ZOFRAN) tablet 4 mg (not administered)    Or  ondansetron (ZOFRAN) injection 4 mg (not  administered)  levalbuterol (XOPENEX) nebulizer solution 0.63 mg (not administered)  cefTRIAXone (ROCEPHIN) 1 g in dextrose 5 % 50 mL IVPB (not administered)  azithromycin (ZITHROMAX) 500 mg in dextrose 5 % 250 mL IVPB (not administered)  traMADol (ULTRAM) tablet 50 mg (not administered)  sodium chloride 0.9 % bolus 500 mL (500 mLs Intravenous Transfusing/Transfer 01/25/17 1816)    Mobility walker

## 2017-01-26 ENCOUNTER — Inpatient Hospital Stay (HOSPITAL_COMMUNITY): Payer: Medicare Other

## 2017-01-26 DIAGNOSIS — G459 Transient cerebral ischemic attack, unspecified: Secondary | ICD-10-CM

## 2017-01-26 DIAGNOSIS — D72829 Elevated white blood cell count, unspecified: Secondary | ICD-10-CM

## 2017-01-26 DIAGNOSIS — I10 Essential (primary) hypertension: Secondary | ICD-10-CM

## 2017-01-26 DIAGNOSIS — E871 Hypo-osmolality and hyponatremia: Secondary | ICD-10-CM

## 2017-01-26 DIAGNOSIS — R531 Weakness: Secondary | ICD-10-CM

## 2017-01-26 DIAGNOSIS — K869 Disease of pancreas, unspecified: Secondary | ICD-10-CM

## 2017-01-26 DIAGNOSIS — K219 Gastro-esophageal reflux disease without esophagitis: Secondary | ICD-10-CM

## 2017-01-26 DIAGNOSIS — K5909 Other constipation: Secondary | ICD-10-CM

## 2017-01-26 DIAGNOSIS — I5032 Chronic diastolic (congestive) heart failure: Secondary | ICD-10-CM

## 2017-01-26 LAB — VAS US CAROTID
LCCAPDIAS: 13 cm/s
LEFT ECA DIAS: -9 cm/s
LEFT VERTEBRAL DIAS: 7 cm/s
LICADDIAS: -16 cm/s
LICAPDIAS: -14 cm/s
Left CCA dist dias: -14 cm/s
Left CCA dist sys: -79 cm/s
Left CCA prox sys: 89 cm/s
Left ICA dist sys: -61 cm/s
Left ICA prox sys: -65 cm/s
RCCADSYS: -83 cm/s
RIGHT ECA DIAS: -2 cm/s
RIGHT VERTEBRAL DIAS: -5 cm/s
Right CCA prox sys: 83 cm/s

## 2017-01-26 LAB — COMPREHENSIVE METABOLIC PANEL
ALK PHOS: 37 U/L — AB (ref 38–126)
ALT: 13 U/L — AB (ref 14–54)
AST: 15 U/L (ref 15–41)
Albumin: 3.3 g/dL — ABNORMAL LOW (ref 3.5–5.0)
Anion gap: 9 (ref 5–15)
BUN: 28 mg/dL — AB (ref 6–20)
CALCIUM: 9.3 mg/dL (ref 8.9–10.3)
CHLORIDE: 97 mmol/L — AB (ref 101–111)
CO2: 24 mmol/L (ref 22–32)
CREATININE: 1.34 mg/dL — AB (ref 0.44–1.00)
GFR, EST AFRICAN AMERICAN: 43 mL/min — AB (ref >60)
GFR, EST NON AFRICAN AMERICAN: 37 mL/min — AB (ref >60)
Glucose, Bld: 80 mg/dL (ref 65–99)
Potassium: 4.1 mmol/L (ref 3.5–5.1)
SODIUM: 130 mmol/L — AB (ref 135–145)
Total Bilirubin: 0.4 mg/dL (ref 0.3–1.2)
Total Protein: 6.1 g/dL — ABNORMAL LOW (ref 6.5–8.1)

## 2017-01-26 LAB — TROPONIN I
Troponin I: <0.03 ng/mL (ref <0.03)
Troponin I: <0.03 ng/mL (ref <0.03)

## 2017-01-26 LAB — ECHOCARDIOGRAM COMPLETE
Height: 63 in
Weight: 2224 oz

## 2017-01-26 LAB — HIV ANTIBODY (ROUTINE TESTING W REFLEX): HIV Screen 4th Generation wRfx: NONREACTIVE

## 2017-01-26 LAB — CBC
HCT: 29.6 % — ABNORMAL LOW (ref 36.0–46.0)
Hemoglobin: 10.3 g/dL — ABNORMAL LOW (ref 12.0–15.0)
MCH: 30.9 pg (ref 26.0–34.0)
MCHC: 34.8 g/dL (ref 30.0–36.0)
MCV: 88.9 fL (ref 78.0–100.0)
Platelets: 226 10*3/uL (ref 150–400)
RBC: 3.33 MIL/uL — AB (ref 3.87–5.11)
RDW: 13 % (ref 11.5–15.5)
WBC: 17.1 10*3/uL — ABNORMAL HIGH (ref 4.0–10.5)

## 2017-01-26 MED ORDER — ISOSORBIDE MONONITRATE ER 60 MG PO TB24
60.0000 mg | ORAL_TABLET | Freq: Every day | ORAL | Status: DC
  Administered 2017-01-27 – 2017-01-28 (×2): 60 mg via ORAL
  Filled 2017-01-26 (×2): qty 1

## 2017-01-26 MED ORDER — PREDNISONE 20 MG PO TABS
20.0000 mg | ORAL_TABLET | Freq: Every day | ORAL | Status: DC
  Administered 2017-01-27 – 2017-01-28 (×2): 20 mg via ORAL
  Filled 2017-01-26 (×2): qty 1

## 2017-01-26 MED ORDER — ENSURE ENLIVE PO LIQD
237.0000 mL | ORAL | Status: DC
  Administered 2017-01-26 – 2017-01-28 (×2): 237 mL via ORAL

## 2017-01-26 MED ORDER — FLEET ENEMA 7-19 GM/118ML RE ENEM
1.0000 | ENEMA | Freq: Once | RECTAL | Status: AC
  Administered 2017-01-26: 1 via RECTAL
  Filled 2017-01-26: qty 1

## 2017-01-26 NOTE — Progress Notes (Addendum)
VASCULAR LAB PRELIMINARY  PRELIMINARY  PRELIMINARY  PRELIMINARY  Carotid duplex completed.    Preliminary report:  Right - No evidence of significant extracranial ICA stenosis. Vertebral artery flow is antegrade. Left- 1% to 39% ICA stenosis lower end of range. Vertebral artery flow is antegrade.  Aanchal Cope, RVS 01/26/2017, 12:10 PM

## 2017-01-26 NOTE — Progress Notes (Signed)
Initial Nutrition Assessment  DOCUMENTATION CODES:   Not applicable  INTERVENTION:   Ensure Enlive po daily, each supplement provides 350 kcal and 20 grams of protein  NUTRITION DIAGNOSIS:   Swallowing difficulty related to dysphagia as evidenced by other (see comment) (pt with chronic aspiration on DYS 1 diet ).  GOAL:   Patient will meet greater than or equal to 90% of their needs  MONITOR:   PO intake, Supplement acceptance, Labs, Weight trends  REASON FOR ASSESSMENT:   Malnutrition Screening Tool    ASSESSMENT:   76 year old female with a history of CAD (s/p DES to RCA in 2006, low-risk NST in 2014, cath in 08/2015 showing patent RCA stent with nonobstructive disease), chronic diastolic CHF, HTN, HLD, Stage 3 CKD, MGUS, PMR and prior CVA who presents to ED today for generalized weakness, headache, fatigue.   Pt with a history of dysphagia and chronic aspiration. Pt had MBS in April and approved for a DYS 3/thin liquid diet. Pt initiated on DYS 1/thin liquid diet this admit; pt is currently eating 100% of her meals in hospital. Recommend SLP evaluation as pt with chronic aspiration. Per chart, pt is weight stable. RD will order Ensure daily to help pt meet estimated needs on a pureed diet. Sodium improving.   Medications reviewed and include: plavix, heparin, synthroid, protonix, miralax, prednisone, azithromycin, ceftriaxone   Labs reviewed: Na 130(L), Cl 97(L), BUN 28(H), creat 1.34(H), Alk Phos 37(L), alb 3.3(L) Wbc- 17.1(H)  Nutrition-Focused physical exam completed. Findings are no fat depletion, moderate muscle depletion in temporal lobes, and no edema.   Diet Order:  DIET - DYS 1 Room service appropriate? Yes with Assist; Fluid consistency: Thin  Skin:  Reviewed, no issues  Last BM:  constipation   Height:   Ht Readings from Last 1 Encounters:  01/25/17 5' 3"  (1.6 m)    Weight:   Wt Readings from Last 1 Encounters:  01/25/17 139 lb (63 kg)    Ideal  Body Weight:  52.3 kg  BMI:  Body mass index is 24.62 kg/m.  Estimated Nutritional Needs:   Kcal:  1450-1750kcal/day   Protein:  63-70g/day   Fluid:  >1.4L/day   EDUCATION NEEDS:   No education needs identified at this time  Koleen Distance MS, RD, [place] Pager #[phone] After Hours Pager: [phone]

## 2017-01-26 NOTE — Progress Notes (Signed)
  Echocardiogram 2D Echocardiogram has been performed.  Darlina Sicilian M 01/26/2017, 2:39 PM

## 2017-01-26 NOTE — Progress Notes (Signed)
Took over care of patient around 1500; agree with previous RN assessment.  Will continue to monitor and care for patient.  Carmela Hurt, RN

## 2017-01-26 NOTE — Progress Notes (Signed)
PROGRESS NOTE        PATIENT DETAILS Name: Rebecca [name] Age: 76 y.o. Sex: female Date of Birth: June 07, 1941 Admit Date: 01/25/2017 Admitting Physician Reyne Dumas, MD GHW:[id number], Nicki Reaper, MD  Brief Narrative: Patient is a 76 y.o. female with PMHx of CAD s/p PCI in 2006, MGUS, recently diagnosed with Pancreatic mass, prior hx of TIA-presented to the ED for evaluation of generalized weakness. Her family was concerned for CVA as she had a questionable left sided facial droop. She was found to have hyponatremia, and probable PNA. She was then admitted to the hospitalist service for further evaluation and treatment. See below for further details.   Subjective: Feels sleepy-but no major complaints this morning.   Assessment/Plan: Hyponatremia: Etiology is not very clear but given-relatively stable volume status, suspect SIADH. But given that random cortisol was only 3.9-not sure if she was taking her prednisone, we'll go ahead and double her prednisone for a few days. TSH was within normal limits. Sodium levels improving, should be able to stop IV fluids today.   ? Pneumonia: Patient has no clinical features of pneumonia-her most recent CT scan of the abdomen showed scarring at bilateral lung bases, furthermore a recent CT scan in June of this year showed probably inflammatory nodules. Stop antibiotics, repeat CT chest as recommended by radiology in June.  ? TIA: Apparently she did have a slight left facial droop on admission-but she initially presented to the ED for generalized weakness. She apparently was taken of Plavix in anticipation of ERCP to evaluate her pancreatic mass. MRI of the brain did not show acute CVA. Awaiting carotid Doppler and2-D echocardiogram, check A1c and lipid panel. I doubt that she had any neurological event on admission at this time. Telemetry without any arrhythmias.  Leukocytosis: Appears to be chronic, being followed by hematology/oncology in  the outpatient setting. Does have history of MGUS as well. Stopping antibiotics as no indication of infection at this time. Blood cultures drawn on 8/14 are negative so far. Could be due to chronic steroid use.  Pancreatic mass: Recently diagnosed with a pancreatic mass-seen by GI with plans for outpatient ERCP-subsequently Plavix was discontinued-unfortunately with TIA-like symptoms this has been resumed-suspect that patient needs continued follow-up with GI/oncology for continued workup in the outpatient setting.  MGUS: Followed by oncology in the outpatient setting-defer to oncology  History of CAD status post PCI in 2006: No anginal symptoms, continue Plavix, metoprolol and statin.  Chronic kidney disease stage IV: Creatinine close to usual baseline.  Dyslipidemia: Continue statin  History of CAD status post PCI in 2006: No anginal symptoms, continue Plavix, metoprolol and statin.  HTN: Fluctuating but mostly uncontrolled-continue with metoprolol, Cardizem-resume Imdur.  History of polymyalgia rheumatica: Continue with prednisone-double the dose for a few days given low cortisol/and hyponatremia.  GERD: Continue PPI  COPD: Appear stable without any signs of exacerbation-continue bronchodilators.  Hypothyroidism: Continue with Synthroid  Generalized weakness: I suspect this is mostly due to underlying chronic illness-nonfocal exam, obtain PT eval.  Morning labs/Imaging ordered: yes  DVT Prophylaxis: Prophylactic Heparin   Code Status: Full code   Family Communication: None at bedside  Disposition Plan: Remain inpatient-hopefully home in the next few days  Antimicrobial agents: Anti-infectives    Start     Dose/Rate Route Frequency Ordered Stop   01/25/17 2000  azithromycin (ZITHROMAX) 500 mg in dextrose  5 % 250 mL IVPB     500 mg 250 mL/hr over 60 Minutes Intravenous Every 24 hours 01/25/17 1744 02/01/17 2159   01/25/17 1930  cefTRIAXone (ROCEPHIN) 1 g in dextrose 5 %  50 mL IVPB     1 g 100 mL/hr over 30 Minutes Intravenous Every 24 hours 01/25/17 1744 02/01/17 2159   01/25/17 1730  cefTRIAXone (ROCEPHIN) 1 g in dextrose 5 % 50 mL IVPB  Status:  Discontinued     1 g 100 mL/hr over 30 Minutes Intravenous  Once 01/25/17 1723 01/25/17 2223   01/25/17 1730  azithromycin (ZITHROMAX) 500 mg in dextrose 5 % 250 mL IVPB  Status:  Discontinued     500 mg 250 mL/hr over 60 Minutes Intravenous  Once 01/25/17 1723 01/25/17 2252      Procedures: None  CONSULTS:  None  Time spent: 25 minutes-Greater than 50% of this time was spent in counseling, explanation of diagnosis, planning of further management, and coordination of care.  MEDICATIONS: Scheduled Meds: . clopidogrel  75 mg Oral QHS  . cloZAPine  300 mg Oral QHS  . diltiazem  180 mg Oral Daily  . feeding supplement (ENSURE ENLIVE)  237 mL Oral Q24H  . heparin  5,000 Units Subcutaneous Q8H  . levothyroxine  75 mcg Oral QAC breakfast  . metoprolol succinate  100 mg Oral Q breakfast  . mometasone-formoterol  2 puff Inhalation BID  . pantoprazole  40 mg Oral Daily  . polyethylene glycol  17 g Oral Daily  . pravastatin  20 mg Oral QHS  . predniSONE  10 mg Oral Q breakfast  . tiotropium  1 capsule Inhalation q morning - 10a   Continuous Infusions: . sodium chloride 75 mL/hr at 01/26/17 1227  . azithromycin Stopped (01/26/17 0109)  . cefTRIAXone (ROCEPHIN)  IV Stopped (01/26/17 0006)   PRN Meds:.acetaminophen **OR** acetaminophen, levalbuterol, ondansetron **OR** ondansetron (ZOFRAN) IV, traMADol   PHYSICAL EXAM: Vital signs: Vitals:   01/25/17 1856 01/25/17 2140 01/26/17 0503 01/26/17 1339  BP: (!) 136/59  (!) 168/57 (!) 145/69  Pulse: 64  (!) 59 74  Resp: 20 18 17 18   Temp: 97.6 F (36.4 C) 97.7 F (36.5 C) 97.8 F (36.6 C) 97.9 F (36.6 C)  TempSrc: Oral Oral Oral Oral  SpO2: 100% 96% 100% 96%  Weight:      Height:       Filed Weights   01/25/17 1828  Weight: 63 kg (139 lb)    Body mass index is 24.62 kg/m.   General appearance :Awake, alert, not in any distress. Speech Clear. Chronically sick appearing Eyes:, pupils equally reactive to light and accomodation,no scleral icterus. HEENT: Atraumatic and Normocephalic Neck: supple, no JVD. No cervical lymphadenopathy. Resp:Good air entry bilaterally, no added sounds  CVS: S1 S2 regular, no murmurs.  GI: Bowel sounds present, Non tender and not distended with no gaurding, rigidity or rebound.No organomegaly Extremities: B/L Lower Ext shows no edema, both legs are warm to touch Neurology:  speech clear,Non focal, sensation is grossly intact. Psychiatric: Normal judgment and insight.  Musculoskeletal:No digital cyanosis Skin:No Rash, warm and dry Wounds:N/A  I have personally reviewed following labs and imaging studies  LABORATORY DATA: CBC:  Recent Labs Lab 01/25/17 1644 01/25/17 1657 01/26/17 0530  WBC 21.4*  --  17.1*  NEUTROABS 19.5*  --   --   HGB 9.7* 9.5* 10.3*  HCT 28.4* 28.0* 29.6*  MCV 87.7  --  88.9  PLT 228  --  161    Basic Metabolic Panel:  Recent Labs Lab 01/25/17 1644 01/25/17 1657 01/25/17 1738 01/26/17 0530  NA 128* 125*  --  130*  K 5.0 4.9  --  4.1  CL 94* 91*  --  97*  CO2 27  --   --  24  GLUCOSE 175* 170*  --  80  BUN 33* 30*  --  28*  CREATININE 1.57* 1.50*  --  1.34*  CALCIUM 9.4  --   --  9.3  MG  --   --  1.6*  --     GFR: Estimated Creatinine Clearance: 31.5 mL/min (A) (by C-G formula based on SCr of 1.34 mg/dL (H)).  Liver Function Tests:  Recent Labs Lab 01/25/17 1644 01/26/17 0530  AST 14* 15  ALT 12* 13*  ALKPHOS 38 37*  BILITOT 0.3 0.4  PROT 6.2* 6.1*  ALBUMIN 3.3* 3.3*   No results for input(s): LIPASE, AMYLASE in the last 168 hours. No results for input(s): AMMONIA in the last 168 hours.  Coagulation Profile:  Recent Labs Lab 01/25/17 1644  INR 0.95    Cardiac Enzymes:  Recent Labs Lab 01/25/17 1738 01/25/17 2348  01/26/17 0530  TROPONINI <0.03 <0.03 <0.03    BNP (last 3 results) No results for input(s): PROBNP in the last 8760 hours.  HbA1C: No results for input(s): HGBA1C in the last 72 hours.  CBG:  Recent Labs Lab 01/25/17 1540  GLUCAP 184*    Lipid Profile: No results for input(s): CHOL, [name], [name], [name], [name], LDLDIRECT in the last 72 hours.  Thyroid Function Tests:  Recent Labs  01/25/17 1738  TSH 1.173    Anemia Panel: No results for input(s): VITAMINB12, FOLATE, FERRITIN, TIBC, IRON, RETICCTPCT in the last 72 hours.  Urine analysis:    Component Value Date/Time   COLORURINE YELLOW 01/25/2017 2149   APPEARANCEUR CLEAR 01/25/2017 2149   LABSPEC 1.009 01/25/2017 2149   LABSPEC 1.015 01/06/2015 1542   PHURINE 6.0 01/25/2017 2149   GLUCOSEU NEGATIVE 01/25/2017 2149   GLUCOSEU Negative 01/06/2015 1542   HGBUR NEGATIVE 01/25/2017 2149   HGBUR negative 12/03/2008 1113   BILIRUBINUR NEGATIVE 01/25/2017 2149   BILIRUBINUR Negative 01/06/2015 1542   KETONESUR NEGATIVE 01/25/2017 2149   PROTEINUR NEGATIVE 01/25/2017 2149   UROBILINOGEN 0.2 01/06/2015 1542   NITRITE NEGATIVE 01/25/2017 2149   LEUKOCYTESUR NEGATIVE 01/25/2017 2149   LEUKOCYTESUR Negative 01/06/2015 1542    Sepsis Labs: Lactic Acid, Venous    Component Value Date/Time   LATICACIDVEN 1.25 01/25/2017 1711    MICROBIOLOGY: Recent Results (from the past 240 hour(s))  Urine culture     Status: Abnormal   Collection Time: 01/16/17  8:26 PM  Result Value Ref Range Status   Specimen Description URINE, RANDOM  Final   Special Requests NONE  Final   Culture MULTIPLE SPECIES PRESENT, SUGGEST RECOLLECTION (A)  Final   Report Status 01/18/2017 FINAL  Final  Culture, blood (Routine X 2) w Reflex to ID Panel     Status: None (Preliminary result)   Collection Time: 01/25/17  5:41 PM  Result Value Ref Range Status   Specimen Description BLOOD BLOOD RIGHT FOREARM  Final   Special Requests   Final     BOTTLES DRAWN AEROBIC AND ANAEROBIC Blood Culture adequate volume   Culture   Final    NO GROWTH < 24 HOURS Performed at [facility], [place] [address], [place], [place] [address]    Report Status PENDING  Incomplete  Culture, blood (routine x 2) Call MD if unable to obtain prior to antibiotics being given     Status: None (Preliminary result)   Collection Time: 01/25/17  6:20 PM  Result Value Ref Range Status   Specimen Description BLOOD LEFT ANTECUBITAL  Final   Special Requests   Final    BOTTLES DRAWN AEROBIC AND ANAEROBIC Blood Culture adequate volume   Culture   Final    NO GROWTH < 24 HOURS Performed at [facility], [address], [place], [place] [address]    Report Status PENDING  Incomplete    RADIOLOGY STUDIES/RESULTS: Ct Abdomen Pelvis Wo Contrast  Result Date: 01/16/2017 CLINICAL DATA:  Pt is c/o diffuse abdominal pain associated with distension and a hx of constipation and bowel obstruction. Adds that she has multiple episodes of emesis and family administered an enema this morning after which she had a significant bowel movement EXAM: CT ABDOMEN AND PELVIS WITHOUT CONTRAST TECHNIQUE: Multidetector CT imaging of the abdomen and pelvis was performed following the standard protocol without IV contrast. COMPARISON:  10/07/2016 FINDINGS: Lower chest: There is patchy airspace filling opacity in the left lower lobe, bibasilar scarring is again identified, left greater than right. No new or suspicious findings in the lung bases. Coronary artery calcifications and stent are present. Hepatobiliary: Status post cholecystectomy. The liver is homogeneous. Pancreas: No pancreatic ductal dilatation or surrounding inflammatory changes. There is a fluid attenuation mass within the pancreatic body measuring 2.8 x 2.2 cm. Mass appears stable compared to the most recent study but demonstrates growth since study in 2013. Spleen: Normal in size without focal abnormality. Adrenals/Urinary  Tract: The adrenal glands are normal. There are bilateral renal cysts. No hydronephrosis or ureteral obstruction. Urinary bladder is normal in appearance. Stomach/Bowel: The stomach is normal in appearance. Small bowel loops are normal in appearance. The appendix is well seen and has a normal appearance. There is marked distension of the entire colon by solid stool. The colon is tortuous and distended to level of the cecum. Vascular/Lymphatic: There is atherosclerotic calcification of the abdominal aorta not associated with aneurysm. There is significant calcified mural thrombus, stable in appearance and best seen on image 21 of series 2. No retroperitoneal or mesenteric adenopathy. Reproductive: Status post hysterectomy.  No adnexal mass. Other: None Musculoskeletal: Significant degenerative changes in the lower thoracic in lumbar spine. There is 5 mm retrolisthesis associated with significant disc height loss at L2-3. Vacuum disc identified at L4-5 and L5-S1. No suspicious lytic or blastic lesions are identified. IMPRESSION: 1. Distended tortuous colon containing a large amount of stool, likely related to the patient's symptoms of abdominal pain. 2. 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 By consensus criteria, referral for possible endoscopic ultrasound /FNA and surgical consultation is recommended. 3. Scarring at both lung bases. 4. Coronary artery disease. 5. Status post cholecystectomy. 6.  Aortic atherosclerosis.  (ICD10-I70.0) Electronically Signed   By: Nolon Nations M.D.   On: 01/16/2017 20:43   Dg Chest 2 View  Result Date: 01/25/2017 CLINICAL DATA:  Headache.  Lethargy. EXAM: CHEST  2 VIEW COMPARISON:  01/16/2017 FINDINGS: The heart size is normal. Persistent left lung base opacity compatible with atelectasis and/or pneumonia. Right lung is clear. No pleural effusions. IMPRESSION: 1. Persistent left lung base opacity which may represent  atelectasis or pneumonia. Electronically Signed   By: Kerby Moors M.D.   On: 01/25/2017 16:23   Dg Chest 2 View  Result Date: 01/16/2017 CLINICAL DATA:  Diffuse abdominal pain associated with distention. EXAM: CHEST  2 VIEW COMPARISON:  11/19/2016 CXR, 12/08/2016 CT FINDINGS: The heart size and mediastinal contours are within normal limits. Aortic atherosclerosis at the arch without aneurysmal dilatation. Bibasilar atelectasis is noted. No overt pulmonary edema. 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 No acute osseous abnormality. IMPRESSION: 1. Left basilar atelectasis. 2. Aortic atherosclerosis. 3. Bilateral AC and glenohumeral joint osteoarthritis with small ossific loose body in the left axillary pouch. Electronically Signed   By: Ashley Royalty M.D.   On: 01/16/2017 19:17   Ct Head Wo Contrast  Result Date: 01/25/2017 CLINICAL DATA:  Lethargy and headache. Left-sided facial droop, transient EXAM: CT HEAD WITHOUT CONTRAST TECHNIQUE: Contiguous axial images were obtained from the base of the skull through the vertex without intravenous contrast. COMPARISON:  Oct 19, 2016 FINDINGS: Brain: There is mild diffuse atrophy. There is no intracranial mass, hemorrhage, extra-axial fluid collection, or midline shift. There is patchy small vessel disease in the centra semiovale bilaterally, stable. There is no new gray-white compartment lesion. No acute infarct evident. Vascular: There is no appreciable hyperdense vessel. There is calcification in each carotid siphon region. Skull: Bony calvarium appears intact. Sinuses/Orbits: Visualized paranasal sinuses are clear. Visualized orbits appear symmetric bilaterally. Other: There is opacification in several inferior mastoid air cells on the left, stable. Mastoids elsewhere are clear. IMPRESSION: Atrophy with stable patchy supratentorial small vessel disease. No intracranial mass, hemorrhage, or  extra-axial fluid collection. No acute infarct evident. There are foci of arterial vascular calcification. There is mild chronic inferior mastoid air cell disease on the left, stable. Electronically Signed   By: Lowella Grip III M.D.   On: 01/25/2017 16:31   Mr Brain Wo Contrast  Result Date: 01/25/2017 CLINICAL DATA:  Headache and weakness.  Left-sided facial droop. EXAM: MRI HEAD WITHOUT CONTRAST TECHNIQUE: Multiplanar, multiecho pulse sequences of the brain and surrounding structures were obtained without intravenous contrast. COMPARISON:  Head CT 01/25/2017 FINDINGS: Brain: The midline structures are normal. There is no focal diffusion restriction to indicate acute infarct. There is confluent hyperintense T2-weighted signal within the periventricular white matter, most often seen in the setting of chronic microvascular ischemia. No intraparenchymal hematoma or chronic microhemorrhage. No lobar predominant atrophy. The dura is normal and there is no extra-axial collection. Vascular: Major intracranial arterial and venous sinus flow voids are preserved. Skull and upper cervical spine: The visualized skull base, calvarium, upper cervical spine and extracranial soft tissues are normal. Sinuses/Orbits: No fluid levels or advanced mucosal thickening. No mastoid or middle ear effusion. Normal orbits. IMPRESSION: 1. No acute abnormality. 2. Advanced chronic microvascular ischemia. Electronically Signed   By: Ulyses Jarred M.D.   On: 01/25/2017 21:13     LOS: 1 day   Oren Binet, MD  [facility] Pager:[phone]  If 7PM-7AM, please contact night-coverage [email or web address] Password [email or web address] 01/26/2017, 2:21 PM

## 2017-01-26 NOTE — Evaluation (Signed)
Occupational Therapy Evaluation Patient Details Name: Rebecca [name] MRN: [id number] DOB: 07-Apr-1941 Today's Date: 01/26/2017    History of Present Illness  76 y.o. female admitted with generalized weakness.  MRI - for acute infarct.  Past medical history significant of MGUS, PMR, CKD-4, HTN, hypothyroidism, COPD, schizoaffective disorder, CAD.    Clinical Impression   Pt was admitted for the above.  She is mostly mod I for basic adls.  Pt currently needs min A.  Will follow in acute setting with set up to supervision level goals    Follow Up Recommendations  Supervision/Assistance - 24 hour    Equipment Recommendations  None recommended by OT    Recommendations for Other Services       Precautions / Restrictions Precautions Precautions: Fall Restrictions Weight Bearing Restrictions: No      Mobility Bed Mobility Overal bed mobility: Modified Independent             General bed mobility comments: HOB raised  Transfers Overall transfer level: Needs assistance Equipment used: Rolling walker (2 wheeled) Transfers: Sit to/from Stand Sit to Stand: Min guard         General transfer comment: cues for ue placement    Balance                                           ADL either performed or assessed with clinical judgement   ADL Overall ADL's : Needs assistance/impaired     Grooming: Wash/dry hands;Supervision/safety;Standing   Upper Body Bathing: Set up;Sitting   Lower Body Bathing: Minimal assistance;Sit to/from stand   Upper Body Dressing : Set up;Sitting   Lower Body Dressing: Minimal assistance;Sit to/from stand   Toilet Transfer: Min guard;Ambulation;Comfort height toilet;RW   Toileting- Clothing Manipulation and Hygiene: Minimal assistance;Sit to/from stand         General ADL Comments: ambulated to bathroom with min guard assistance     Vision         Perception     Praxis      Pertinent Vitals/Pain Pain  Assessment: Faces Faces Pain Scale: Hurts little more Pain Location: bil hips Pain Descriptors / Indicators: Aching Pain Intervention(s): Limited activity within patient's tolerance;Monitored during session;Repositioned     Hand Dominance     Extremity/Trunk Assessment Upper Extremity Assessment Upper Extremity Assessment: Overall WFL for tasks assessed           Communication Communication Communication: No difficulties   Cognition Arousal/Alertness: Awake/alert Behavior During Therapy: WFL for tasks assessed/performed Overall Cognitive Status: Impaired/Different from baseline                                 General Comments: followed all commands; pt moves quickly, cued for safety. Forgot to pull pants up after pulling underwear up   General Comments       Exercises     Shoulder Instructions      Home Living Family/patient expects to be discharged to:: Private residence   Available Help at Discharge: Family               Bathroom Shower/Tub: Walk-in Psychologist, prison and probation services: Standard     Home Equipment: Clinical cytogeneticist - 2 wheels;Walker - 4 wheels;Hospital bed;Bedside commode          Prior Functioning/Environment Level of Independence: Independent  with assistive device(s)        Comments: daughter assists with adls as needed        OT Problem List: Decreased strength;Decreased activity tolerance;Impaired balance (sitting and/or standing);Decreased cognition;Pain      OT Treatment/Interventions: Self-care/ADL training;DME and/or AE instruction;Therapeutic activities;Patient/family education;Balance training;Cognitive remediation/compensation    OT Goals(Current goals can be found in the care plan section) Acute Rehab OT Goals Patient Stated Goal: none stated; agreeable to OT/PT evaluations OT Goal Formulation: With patient Time For Goal Achievement: 02/02/17 Potential to Achieve Goals: Good ADL Goals Pt Will Perform Lower  Body Bathing: with set-up;sit to/from stand Pt Will Perform Lower Body Dressing: with set-up;sit to/from stand Pt Will Transfer to Toilet: with supervision;ambulating;regular height toilet Pt Will Perform Toileting - Clothing Manipulation and hygiene: with modified independence;sit to/from stand Additional ADL Goal #1: pt will not need any cues for safety or activity completion  OT Frequency: Min 2X/week   Barriers to D/C:            Co-evaluation PT/OT/SLP Co-Evaluation/Treatment: Yes Reason for Co-Treatment: For patient/therapist safety PT goals addressed during session: Mobility/safety with mobility OT goals addressed during session: ADL's and self-care      AM-PAC PT "6 Clicks" Daily Activity     Outcome Measure Help from another person eating meals?: None Help from another person taking care of personal grooming?: A Little Help from another person toileting, which includes using toliet, bedpan, or urinal?: A Little Help from another person bathing (including washing, rinsing, drying)?: A Little Help from another person to put on and taking off regular upper body clothing?: A Little Help from another person to put on and taking off regular lower body clothing?: A Little 6 Click Score: 19   End of Session    Activity Tolerance: Patient limited by fatigue Patient left: in bed;with call bell/phone within reach;with bed alarm set;with family/visitor present  OT Visit Diagnosis: Muscle weakness (generalized) (M62.81)                Time: 6808-8110 (MD came in room during assessment) OT Time Calculation (min): 35 min Charges:  OT General Charges $OT Visit: 1 Procedure OT Evaluation $OT Eval Low Complexity: 1 Procedure G-Codes:     Lesle Chris, OTR/L [phone] 01/26/2017  Nikita Surman 01/26/2017, 3:37 PM

## 2017-01-26 NOTE — Evaluation (Signed)
Physical Therapy Evaluation Patient Details Name: Rebecca [name] MRN: [id number] DOB: February 25, 1941 Today's Date: 01/26/2017   History of Present Illness   76 y.o. female admitted with generalized weakness, hyponatremia.  MRI - for acute infarct.  Hx of CAD, CHF, HTN, COPD, schizoaffective d/o, CKD, CVA, pancreatic lesion, polymyalgia rheumatica    Clinical Impression  On eval, pt was Min guard assist for mobility. She walked ~15 feet x 2 with a RW. Pt declined hallway ambulation on today. Son was present during session. Do not anticipate any f/u PT needs at this time. Will continue to follow, assess, and progress activity as tolerated.     Follow Up Recommendations Supervision/Assistance - 24 hour    Equipment Recommendations  None recommended by PT    Recommendations for Other Services       Precautions / Restrictions Precautions Precautions: Fall Restrictions Weight Bearing Restrictions: No      Mobility  Bed Mobility Overal bed mobility: Modified Independent             General bed mobility comments: HOB raised  Transfers Overall transfer level: Needs assistance Equipment used: Rolling walker (2 wheeled) Transfers: Sit to/from Stand Sit to Stand: Supervision         General transfer comment: for safety. VCs hand placement  Ambulation/Gait Ambulation/Gait assistance: Min guard Ambulation Distance (Feet): 15 Feet (x2) Assistive device: Rolling walker (2 wheeled) Gait Pattern/deviations: Step-through pattern     General Gait Details: close guard for safety. Pt declined hallway ambulation.   Stairs            Wheelchair Mobility    Modified Rankin (Stroke Patients Only)       Balance                                             Pertinent Vitals/Pain Pain Assessment: Faces Faces Pain Scale: Hurts little more Pain Location: bil hips Pain Descriptors / Indicators: Sore Pain Intervention(s): Monitored during session;Limited  activity within patient's tolerance    Home Living Family/patient expects to be discharged to:: Private residence   Available Help at Discharge: Family Type of Home: House Home Access: Stairs to enter Entrance Stairs-Rails: Right;Left;Can reach both Entrance Stairs-Number of Steps: 4 Home Layout: Two level;Able to live on main level with bedroom/bathroom Home Equipment: Shower seat;Walker - 2 wheels;Walker - 4 wheels;Hospital bed;Bedside commode      Prior Function Level of Independence: Independent with assistive device(s)         Comments: daughter assists with adls as needed     Hand Dominance        Extremity/Trunk Assessment   Upper Extremity Assessment Upper Extremity Assessment: Defer to OT evaluation    Lower Extremity Assessment Lower Extremity Assessment: Generalized weakness    Cervical / Trunk Assessment Cervical / Trunk Assessment: Kyphotic  Communication   Communication: No difficulties  Cognition Arousal/Alertness: Awake/alert Behavior During Therapy: WFL for tasks assessed/performed Overall Cognitive Status: Impaired/Different from baseline                                 General Comments: unsure of cognitive baseline. Noticed some memory deficits during session      General Comments      Exercises     Assessment/Plan    PT Assessment Patient needs continued PT  services  PT Problem List Decreased strength;Decreased mobility;Decreased activity tolerance;Decreased knowledge of use of DME;Decreased cognition;Pain       PT Treatment Interventions DME instruction;Gait training;Therapeutic activities;Therapeutic exercise;Patient/family education;Balance training;Functional mobility training    PT Goals (Current goals can be found in the Care Plan section)  Acute Rehab PT Goals Patient Stated Goal: none stated; agreeable to OT/PT evaluations PT Goal Formulation: With patient Time For Goal Achievement: 02/09/17 Potential to  Achieve Goals: Good    Frequency Min 3X/week   Barriers to discharge        Co-evaluation   Reason for Co-Treatment: For patient/therapist safety PT goals addressed during session: Mobility/safety with mobility OT goals addressed during session: ADL's and self-care       AM-PAC PT "6 Clicks" Daily Activity  Outcome Measure Difficulty turning over in bed (including adjusting bedclothes, sheets and blankets)?: None Difficulty moving from lying on back to sitting on the side of the bed? : None Difficulty sitting down on and standing up from a chair with arms (e.g., wheelchair, bedside commode, etc,.)?: None Help needed moving to and from a bed to chair (including a wheelchair)?: A Little Help needed walking in hospital room?: A Little Help needed climbing 3-5 steps with a railing? : A Little 6 Click Score: 21    End of Session   Activity Tolerance: Patient tolerated treatment well Patient left: in bed;with call bell/phone within reach;with family/visitor present;with bed alarm set   PT Visit Diagnosis: Muscle weakness (generalized) (M62.81);Difficulty in walking, not elsewhere classified (R26.2)    Time: 4599-7741 PT Time Calculation (min) (ACUTE ONLY): 17 min   Charges:   PT Evaluation $PT Eval Low Complexity: 1 Low     PT G Codes:          Weston Anna, MPT Pager: [phone]

## 2017-01-26 NOTE — Progress Notes (Signed)
[facility] [facility] NOTE  Patient Care Team: [name], MD as PCP - General (Internal Medicine) Corliss Parish, MD as Consulting Physician (Nephrology) Debara Pickett Nadean Corwin, MD as Consulting Physician (Cardiology)  CHIEF COMPLAINTS/PURPOSE OF CONSULTATION:  Possible enlarging pancreatic mass, for further evaluation  HISTORY OF PRESENTING ILLNESS:  Rebecca [name] 76 y.o. female is a patient well known to me. She has background history of IgG lambda MGUS. The patient had history of polymyalgia rheumatica and had been on intermittent high-dose prednisone therapy in the past, currently on chronic low dose prednisone. She had chronic bruising from antiplatelet agent and prednisone therapy She has chronic leukocytosis and aspiration pneumonia.  She is being observed in the outpatient setting by myself.  Her last workup in April 2018 show no evidence of MGUS progression.  She was being observed.  The patient has multiple admissions and emergency room visits in the last few months On 01/16/2017, the patient developed severe constipation.  CT imaging show a small mass near the pancreas which has shown some mild progression since 2013. Further workup is recommended. The patient is scheduled for ERCP at the end of the month. Yesterday, she presented to the emergency department after presentation with generalized weakness.  The patient was noted to have left-sided facial droop and drooling.  CT scan of the head and MRI show no evidence of acute stroke. She is noted to have mild hyponatremia, hyperkalemia, acute on chronic renal failure and abnormal chest x-ray, suspicious for pneumonia.  She was being admitted for further evaluation and management. As I walked in, the patient does not remember me. According to her son who brought her in, her symptoms of left-sided facial drooping and drooling had resolved. She complained of severe constipation, with no bowel movement for 3 days. She  denies abdominal pain Her appetite is stable.  She had no recent nausea  MEDICAL HISTORY:  Past Medical History:  Diagnosis Date  . AKI (acute kidney injury) ([place])   . Anemia in chronic renal disease   . CAD (coronary artery disease) 2006   a. s/p DES to RCA in 2006 b. low-risk NST in 2014 c. cath in 08/2015 showing patent RCA stent with nonobstructive disease  . CKD (chronic kidney disease) stage 4, GFR 15-29 ml/min (HCC)   . Complication of anesthesia   . COPD (chronic obstructive pulmonary disease) ([place])   . Diabetes mellitus   . Diastolic heart failure, NYHA class 1 ([place])   . Dyslipidemia   . Dysuria 01/06/2015  . Frozen shoulder    right  . Gastroesophageal reflux   . HAP (hospital-acquired pneumonia)   . Headache   . Hyperkalemia 01/01/2016  . Hypothyroidism   . 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 Obesity   . Pneumonia 06/2016  . Polymyalgia rheumatica ([place])   . PONV (postoperative nausea and vomiting)   . Renal insufficiency   . Respiratory distress   . Schizoaffective disorder ([place])   . Sepsis due to pneumonia ([place])   . Shortness of breath   . Stroke [facility])     SURGICAL HISTORY: Past Surgical History:  Procedure Laterality Date  . ABDOMINAL HYSTERECTOMY    . CARDIAC CATHETERIZATION N/A 08/13/2015   Procedure: Left Heart Cath and Coronary Angiography;  Surgeon: Jettie Booze, MD;  Location: [place] [facility];  Service: Cardiovascular;  Laterality: N/A;  . CATARACT EXTRACTION    . CHOLECYSTECTOMY    . CORONARY ANGIOPLASTY WITH STENT PLACEMENT  2006  Taxus DES to RCA, 50 % residual  . PARTIAL HYSTERECTOMY  1995    SOCIAL HISTORY: Social History   Social History  . Marital status: Divorced    Spouse name: N/A  . Number of children: 3  . Years of education: 9th   Occupational History  . homemaker    Social History Main Topics  . Smoking status: Former Smoker    Packs/day: 1.00    Years: 35.00    Quit date: 04/14/2004   . Smokeless tobacco: Never Used  . Alcohol use No  . Drug use: No  . Sexual activity: No   Other Topics Concern  . Not on file   Social History Narrative   Daughter [name] and son are very involved.     [name] comes to most office visits.   She now lives at home with daughter [name]   Daughter is HCPOA, not averse to SNF/rehab in short-term, but does not wish pt to return to [facility] and requests that pt/pt's care not be discussed by [facility] providers to [facility], moving forward.   Caffeine Use: 1-3 cups daily    FAMILY HISTORY: Family History  Problem Relation Age of Onset  . Diabetes Mother   . Heart disease Mother   . Heart disease Father   . Arthritis Father   . Healthy Sister   . Heart disease Brother   . Cancer Brother        colon ca  . Osteoporosis Sister   . Heart disease Brother   . Diabetes Brother   . Heart disease Brother   . Diabetes Brother   . Diabetes Brother   . Healthy Brother   . Healthy Brother   . Healthy Brother   . Cancer Maternal Uncle        blood condition  . Cancer Daughter        breast ca  . Cancer Cousin        NHL    ALLERGIES:  is allergic to levaquin [levofloxacin] and tape.  MEDICATIONS:  Current Facility-Administered Medications  Medication Dose Route Frequency Provider Last Rate Last Dose  . 0.9 %  sodium chloride infusion   Intravenous Continuous Reyne Dumas, MD 75 mL/hr at 01/25/17 1858    . acetaminophen (TYLENOL) tablet 650 mg  650 mg Oral Q6H PRN Reyne Dumas, MD       Or  . acetaminophen (TYLENOL) suppository 650 mg  650 mg Rectal Q6H PRN Reyne Dumas, MD      . azithromycin (ZITHROMAX) 500 mg in dextrose 5 % 250 mL IVPB  500 mg Intravenous Q24H Reyne Dumas, MD   Stopped at 01/26/17 0109  . cefTRIAXone (ROCEPHIN) 1 g in dextrose 5 % 50 mL IVPB  1 g Intravenous Q24H Reyne Dumas, MD   Stopped at 01/26/17 0006  . clopidogrel (PLAVIX) tablet 75 mg  75 mg Oral QHS Reyne Dumas, MD   75 mg at 01/25/17  2214  . cloZAPine (CLOZARIL) tablet 300 mg  300 mg Oral QHS Reyne Dumas, MD   300 mg at 01/25/17 2237  . diltiazem (CARDIZEM CD) 24 hr capsule 180 mg  180 mg Oral Daily Abrol, Ascencion Dike, MD   180 mg at 01/26/17 0930  . heparin injection 5,000 Units  5,000 Units Subcutaneous Q8H Reyne Dumas, MD   5,000 Units at 01/26/17 0515  . levalbuterol (XOPENEX) nebulizer solution 0.63 mg  0.63 mg Nebulization Q6H PRN Reyne Dumas, MD      . levothyroxine (  SYNTHROID, LEVOTHROID) tablet 75 mcg  75 mcg Oral QAC breakfast Reyne Dumas, MD   75 mcg at 01/26/17 0828  . metoprolol succinate (TOPROL-XL) 24 hr tablet 100 mg  100 mg Oral Q breakfast Reyne Dumas, MD   100 mg at 01/26/17 0828  . mometasone-formoterol (DULERA) 200-5 MCG/ACT inhaler 2 puff  2 puff Inhalation BID Reyne Dumas, MD   2 puff at 01/26/17 0829  . ondansetron (ZOFRAN) tablet 4 mg  4 mg Oral Q6H PRN Reyne Dumas, MD       Or  . ondansetron (ZOFRAN) injection 4 mg  4 mg Intravenous Q6H PRN Abrol, Ascencion Dike, MD      . pantoprazole (PROTONIX) EC tablet 40 mg  40 mg Oral Daily Reyne Dumas, MD   40 mg at 01/26/17 0931  . polyethylene glycol (MIRALAX / GLYCOLAX) packet 17 g  17 g Oral Daily Reyne Dumas, MD   17 g at 01/26/17 0931  . pravastatin (PRAVACHOL) tablet 20 mg  20 mg Oral QHS Reyne Dumas, MD   20 mg at 01/25/17 2214  . predniSONE (DELTASONE) tablet 10 mg  10 mg Oral Q breakfast Reyne Dumas, MD   10 mg at 01/26/17 0828  . tiotropium (SPIRIVA) inhalation capsule 18 mcg  1 capsule Inhalation q morning - 10a Reyne Dumas, MD   18 mcg at 01/26/17 0829  . traMADol (ULTRAM) tablet 50 mg  50 mg Oral Q6H PRN Reyne Dumas, MD   50 mg at 01/25/17 2214    REVIEW OF SYSTEMS:   Constitutional: Denies fevers, chills or abnormal night sweats Eyes: Denies blurriness of vision, double vision or watery eyes Ears, nose, mouth, throat, and face: Denies mucositis or sore throat Respiratory: Denies cough, dyspnea or wheezes Cardiovascular: Denies  palpitation, chest discomfort or lower extremity swelling Skin: Denies abnormal skin rashes Lymphatics: Denies new lymphadenopathy or easy bruising Behavioral/Psych: Mood is stable, no new changes  All other systems were reviewed with the patient and are negative.  PHYSICAL EXAMINATION: ECOG PERFORMANCE STATUS: 3 - Symptomatic, >50% confined to bed  Vitals:   01/25/17 2140 01/26/17 0503  BP:  (!) 168/57  Pulse:  (!) 59  Resp: 18 17  Temp: 97.7 F (36.5 C) 97.8 F (36.6 C)  SpO2: 96% 100%   Filed Weights   01/25/17 1828  Weight: 139 lb (63 kg)    GENERAL:alert, no distress and comfortable.  She looks weak and frail.  Mild cushingoid features SKIN: Extensive bruises are noted EYES: normal, conjunctiva are pink and non-injected, sclera clear OROPHARYNX:no exudate, no erythema and lips, buccal mucosa, and tongue normal  NECK: supple, thyroid normal size, non-tender, without nodularity LYMPH:  no palpable lymphadenopathy in the cervical, axillary or inguinal LUNGS: clear to auscultation and percussion with normal breathing effort HEART: regular rate & rhythm and no murmurs and no lower extremity edema ABDOMEN:abdomen soft, non-tender and normal bowel sounds Musculoskeletal:no cyanosis of digits and no clubbing  PSYCH: alert & oriented x 3 with fluent speech NEURO: no focal motor/sensory deficits  LABORATORY DATA:  I have reviewed the data as listed Lab Results  Component Value Date   WBC 17.1 (H) 01/26/2017   HGB 10.3 (L) 01/26/2017   HCT 29.6 (L) 01/26/2017   MCV 88.9 01/26/2017   PLT 226 01/26/2017    Recent Labs  01/16/17 1839 01/25/17 1644 01/25/17 1657 01/26/17 0530  NA 129* 128* 125* 130*  K 4.1 5.0 4.9 4.1  CL 92* 94* 91* 97*  CO2 27 27  --  24  GLUCOSE 129* 175* 170* 80  BUN 32* 33* 30* 28*  CREATININE 1.70* 1.57* 1.50* 1.34*  CALCIUM 8.5* 9.4  --  9.3  GFRNONAA 28* 31*  --  37*  GFRAA 32* 36*  --  43*  PROT 6.0* 6.2*  --  6.1*  ALBUMIN 3.4* 3.3*   --  3.3*  AST 22 14*  --  15  ALT 15 12*  --  13*  ALKPHOS 36* 38  --  37*  BILITOT 0.6 0.3  --  0.4    RADIOGRAPHIC STUDIES: I have personally reviewed the radiological images as listed and agreed with the findings in the report. Ct Abdomen Pelvis Wo Contrast  Result Date: 01/16/2017 CLINICAL DATA:  Pt is c/o diffuse abdominal pain associated with distension and a hx of constipation and bowel obstruction. Adds that she has multiple episodes of emesis and family administered an enema this morning after which she had a significant bowel movement EXAM: CT ABDOMEN AND PELVIS WITHOUT CONTRAST TECHNIQUE: Multidetector CT imaging of the abdomen and pelvis was performed following the standard protocol without IV contrast. COMPARISON:  10/07/2016 FINDINGS: Lower chest: There is patchy airspace filling opacity in the left lower lobe, bibasilar scarring is again identified, left greater than right. No new or suspicious findings in the lung bases. Coronary artery calcifications and stent are present. Hepatobiliary: Status post cholecystectomy. The liver is homogeneous. Pancreas: No pancreatic ductal dilatation or surrounding inflammatory changes. There is a fluid attenuation mass within the pancreatic body measuring 2.8 x 2.2 cm. Mass appears stable compared to the most recent study but demonstrates growth since study in 2013. Spleen: Normal in size without focal abnormality. Adrenals/Urinary Tract: The adrenal glands are normal. There are bilateral renal cysts. No hydronephrosis or ureteral obstruction. Urinary bladder is normal in appearance. Stomach/Bowel: The stomach is normal in appearance. Small bowel loops are normal in appearance. The appendix is well seen and has a normal appearance. There is marked distension of the entire colon by solid stool. The colon is tortuous and distended to level of the cecum. Vascular/Lymphatic: There is atherosclerotic calcification of the abdominal aorta not associated with  aneurysm. There is significant calcified mural thrombus, stable in appearance and best seen on image 21 of series 2. No retroperitoneal or mesenteric adenopathy. Reproductive: Status post hysterectomy.  No adnexal mass. Other: None Musculoskeletal: Significant degenerative changes in the lower thoracic in lumbar spine. There is 5 mm retrolisthesis associated with significant disc height loss at L2-3. Vacuum disc identified at L4-5 and L5-S1. No suspicious lytic or blastic lesions are identified. IMPRESSION: 1. Distended tortuous colon containing a large amount of stool, likely related to the patient's symptoms of abdominal pain. 2. 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 By consensus criteria, referral for possible endoscopic ultrasound /FNA and surgical consultation is recommended. 3. Scarring at both lung bases. 4. Coronary artery disease. 5. Status post cholecystectomy. 6.  Aortic atherosclerosis.  (ICD10-I70.0) Electronically Signed   By: Nolon Nations M.D.   On: 01/16/2017 20:43   Dg Chest 2 View  Result Date: 01/25/2017 CLINICAL DATA:  Headache.  Lethargy. EXAM: CHEST  2 VIEW COMPARISON:  01/16/2017 FINDINGS: The heart size is normal. Persistent left lung base opacity compatible with atelectasis and/or pneumonia. Right lung is clear. No pleural effusions. IMPRESSION: 1. Persistent left lung base opacity which may represent atelectasis or pneumonia. Electronically Signed   By: Kerby Moors M.D.   On: 01/25/2017 16:23  Dg Chest 2 View  Result Date: 01/16/2017 CLINICAL DATA:  Diffuse abdominal pain associated with distention. EXAM: CHEST  2 VIEW COMPARISON:  11/19/2016 CXR, 12/08/2016 CT FINDINGS: The heart size and mediastinal contours are within normal limits. Aortic atherosclerosis at the arch without aneurysmal dilatation. Bibasilar atelectasis is noted. No overt pulmonary edema. 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 No acute osseous abnormality. IMPRESSION: 1. Left basilar atelectasis. 2. Aortic atherosclerosis. 3. Bilateral AC and glenohumeral joint osteoarthritis with small ossific loose body in the left axillary pouch. Electronically Signed   By: Ashley Royalty M.D.   On: 01/16/2017 19:17   Ct Head Wo Contrast  Result Date: 01/25/2017 CLINICAL DATA:  Lethargy and headache. Left-sided facial droop, transient EXAM: CT HEAD WITHOUT CONTRAST TECHNIQUE: Contiguous axial images were obtained from the base of the skull through the vertex without intravenous contrast. COMPARISON:  Oct 19, 2016 FINDINGS: Brain: There is mild diffuse atrophy. There is no intracranial mass, hemorrhage, extra-axial fluid collection, or midline shift. There is patchy small vessel disease in the centra semiovale bilaterally, stable. There is no new gray-white compartment lesion. No acute infarct evident. Vascular: There is no appreciable hyperdense vessel. There is calcification in each carotid siphon region. Skull: Bony calvarium appears intact. Sinuses/Orbits: Visualized paranasal sinuses are clear. Visualized orbits appear symmetric bilaterally. Other: There is opacification in several inferior mastoid air cells on the left, stable. Mastoids elsewhere are clear. IMPRESSION: Atrophy with stable patchy supratentorial small vessel disease. No intracranial mass, hemorrhage, or extra-axial fluid collection. No acute infarct evident. There are foci of arterial vascular calcification. There is mild chronic inferior mastoid air cell disease on the left, stable. Electronically Signed   By: Lowella Grip III M.D.   On: 01/25/2017 16:31   Mr Brain Wo Contrast  Result Date: 01/25/2017 CLINICAL DATA:  Headache and weakness.  Left-sided facial droop. EXAM: MRI HEAD WITHOUT CONTRAST TECHNIQUE: Multiplanar, multiecho pulse sequences of the brain and surrounding structures were obtained without  intravenous contrast. COMPARISON:  Head CT 01/25/2017 FINDINGS: Brain: The midline structures are normal. There is no focal diffusion restriction to indicate acute infarct. There is confluent hyperintense T2-weighted signal within the periventricular white matter, most often seen in the setting of chronic microvascular ischemia. No intraparenchymal hematoma or chronic microhemorrhage. No lobar predominant atrophy. The dura is normal and there is no extra-axial collection. Vascular: Major intracranial arterial and venous sinus flow voids are preserved. Skull and upper cervical spine: The visualized skull base, calvarium, upper cervical spine and extracranial soft tissues are normal. Sinuses/Orbits: No fluid levels or advanced mucosal thickening. No mastoid or middle ear effusion. Normal orbits. IMPRESSION: 1. No acute abnormality. 2. Advanced chronic microvascular ischemia. Electronically Signed   By: Ulyses Jarred M.D.   On: 01/25/2017 21:13    ASSESSMENT & PLAN:  Abnormal pancreatic mass I am not convinced this is malignant The patient is frail and is unlikely able to tolerate EUS/biopsy Her case was discussed at the GI tumor board today I recommend MRCP and her daughter agreed  Chronic hyponatremia Cause is unknown, could be related to her medications  Chronic kidney disease stage III - IV This is stable.  Monitor closely  Anemia chronic disease This is likely anemia of chronic disease. The patient denies recent history of bleeding such as epistaxis, hematuria or hematochezia. She is asymptomatic from the anemia. We will observe for now.  She does not require transfusion  now.   Chronic leukocytosis Likely due to chronic prednisone therapy.  It is not clear that she may have active infection.  History of recurrent aspiration pneumonia CT scan of the chest is pending  Recurrent TIA She has no focal neurological deficit.  MRI and CT scan of the head is negative  Chronic  constipation Patient requested Fleet enema.  She is also on chronic laxative therapy  Discharge planning Hopefully within the next 24-48 hours After workup is complete and sepsis is ruled out  All questions were answered. The patient knows to call the clinic with any problems, questions or concerns.    Heath Lark, MD 01/26/2017 12:21 PM

## 2017-01-27 ENCOUNTER — Inpatient Hospital Stay (HOSPITAL_COMMUNITY): Payer: Medicare Other

## 2017-01-27 ENCOUNTER — Telehealth: Payer: Self-pay

## 2017-01-27 ENCOUNTER — Encounter (HOSPITAL_COMMUNITY): Payer: Self-pay | Admitting: Family Medicine

## 2017-01-27 DIAGNOSIS — K8689 Other specified diseases of pancreas: Secondary | ICD-10-CM

## 2017-01-27 DIAGNOSIS — G459 Transient cerebral ischemic attack, unspecified: Secondary | ICD-10-CM

## 2017-01-27 DIAGNOSIS — J189 Pneumonia, unspecified organism: Secondary | ICD-10-CM

## 2017-01-27 DIAGNOSIS — I700 Atherosclerosis of aorta: Secondary | ICD-10-CM

## 2017-01-27 HISTORY — DX: Other specified diseases of pancreas: K86.89

## 2017-01-27 HISTORY — DX: Atherosclerosis of aorta: I70.0

## 2017-01-27 LAB — URINE CULTURE: CULTURE: NO GROWTH

## 2017-01-27 LAB — BASIC METABOLIC PANEL
ANION GAP: 7 (ref 5–15)
BUN: 24 mg/dL — AB (ref 6–20)
CHLORIDE: 101 mmol/L (ref 101–111)
CO2: 26 mmol/L (ref 22–32)
Calcium: 9 mg/dL (ref 8.9–10.3)
Creatinine, Ser: 1.21 mg/dL — ABNORMAL HIGH (ref 0.44–1.00)
GFR calc Af Amer: 49 mL/min — ABNORMAL LOW (ref >60)
GFR, EST NON AFRICAN AMERICAN: 42 mL/min — AB (ref >60)
Glucose, Bld: 94 mg/dL (ref 65–99)
POTASSIUM: 4.2 mmol/L (ref 3.5–5.1)
SODIUM: 134 mmol/L — AB (ref 135–145)

## 2017-01-27 LAB — HEMOGLOBIN A1C
HEMOGLOBIN A1C: 6 % — AB (ref 4.8–5.6)
Mean Plasma Glucose: 126 mg/dL

## 2017-01-27 MED ORDER — GADOBENATE DIMEGLUMINE 529 MG/ML IV SOLN: 7.0000 mL | Freq: Once | INTRAVENOUS | Status: DC | PRN

## 2017-01-27 MED ORDER — DEXTROSE 5 % IV SOLN
1.0000 g | INTRAVENOUS | Status: DC
  Administered 2017-01-27 – 2017-01-28 (×2): 1 g via INTRAVENOUS
  Filled 2017-01-27 (×2): qty 10

## 2017-01-27 MED ORDER — AZITHROMYCIN 500 MG IV SOLR
500.0000 mg | INTRAVENOUS | Status: DC
  Administered 2017-01-27 – 2017-01-28 (×2): 500 mg via INTRAVENOUS
  Filled 2017-01-27 (×2): qty 500

## 2017-01-27 NOTE — Progress Notes (Signed)
Physical Therapy Treatment Patient Details Name: Rebecca [name] MRN: [id number] DOB: 06/02/1941 Today's Date: 01/27/2017    History of Present Illness  76 y.o. female admitted with generalized weakness, hyponatremia.  MRI - for acute infarct.  Hx of CAD, CHF, HTN, COPD, schizoaffective d/o, CKD, CVA, pancreatic lesion, polymyalgia rheumatica    PT Comments    Progressing well with mobility. Pt c/o some R hip pain on today. She tolerated ambulation distance well. Assisted pt back to bed-scheduled for procedure this a.m.    Follow Up Recommendations  Supervision/Assistance - 24 hour     Equipment Recommendations  None recommended by PT    Recommendations for Other Services       Precautions / Restrictions Precautions Precautions: Fall Restrictions Weight Bearing Restrictions: No    Mobility  Bed Mobility Overal bed mobility: Modified Independent                Transfers Overall transfer level: Modified independent                  Ambulation/Gait Ambulation/Gait assistance: Supervision Ambulation Distance (Feet): 135 Feet Assistive device: 4-wheeled walker Gait Pattern/deviations: Step-through pattern     General Gait Details: for safety.    Stairs            Wheelchair Mobility    Modified Rankin (Stroke Patients Only)       Balance                                            Cognition Arousal/Alertness: Awake/alert Behavior During Therapy: WFL for tasks assessed/performed Overall Cognitive Status: Impaired/Different from baseline                                 General Comments: unsure of cognitive baseline. Noticed some memory deficits during session      Exercises      General Comments        Pertinent Vitals/Pain Pain Assessment: Faces Faces Pain Scale: Hurts little more Pain Location: bil hips Pain Descriptors / Indicators: Sore;Aching Pain Intervention(s): Limited activity within  patient's tolerance;Repositioned    Home Living                      Prior Function            PT Goals (current goals can now be found in the care plan section) Progress towards PT goals: Progressing toward goals    Frequency    Min 3X/week      PT Plan Current plan remains appropriate    Co-evaluation              AM-PAC PT "6 Clicks" Daily Activity  Outcome Measure  Difficulty turning over in bed (including adjusting bedclothes, sheets and blankets)?: None Difficulty moving from lying on back to sitting on the side of the bed? : None Difficulty sitting down on and standing up from a chair with arms (e.g., wheelchair, bedside commode, etc,.)?: None Help needed moving to and from a bed to chair (including a wheelchair)?: None Help needed walking in hospital room?: None Help needed climbing 3-5 steps with a railing? : A Little 6 Click Score: 23    End of Session   Activity Tolerance: Patient tolerated treatment well Patient left: in bed;with call bell/phone  within reach   PT Visit Diagnosis: Muscle weakness (generalized) (M62.81);Difficulty in walking, not elsewhere classified (R26.2)     Time: 3976-7341 PT Time Calculation (min) (ACUTE ONLY): 10 min  Charges:  $Gait Training: 8-22 mins                    G Codes:          Rebecca [name], MPT Pager: [phone]

## 2017-01-27 NOTE — Evaluation (Addendum)
Clinical/Bedside Swallow Evaluation Patient Details  Name: Rebecca [name] MRN: [id number] Date of Birth: 09-30-1940  Today's Date: 01/27/2017 Time: SLP Start Time (ACUTE ONLY): 1224 SLP Stop Time (ACUTE ONLY): 1255 SLP Time Calculation (min) (ACUTE ONLY): 31 min  Past Medical History:  Past Medical History:  Diagnosis Date  . AKI (acute kidney injury) ([place])   . Anemia in chronic renal disease   . CAD (coronary artery disease) 2006   a. s/p DES to RCA in 2006 b. low-risk NST in 2014 c. cath in 08/2015 showing patent RCA stent with nonobstructive disease  . CKD (chronic kidney disease) stage 4, GFR 15-29 ml/min (HCC)   . Complication of anesthesia   . COPD (chronic obstructive pulmonary disease) ([place])   . Diabetes mellitus   . Diastolic heart failure, NYHA class 1 ([place])   . Dyslipidemia   . Dysuria 01/06/2015  . Frozen shoulder    right  . Gastroesophageal reflux   . HAP (hospital-acquired pneumonia)   . Headache   . Hyperkalemia 01/01/2016  . Hypothyroidism   . 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 Obesity   . Pneumonia 06/2016  . Polymyalgia rheumatica ([place])   . PONV (postoperative nausea and vomiting)   . Renal insufficiency   . Respiratory distress   . Schizoaffective disorder ([place])   . Sepsis due to pneumonia ([place])   . Shortness of breath   . Stroke [facility])    Past Surgical History:  Past Surgical History:  Procedure Laterality Date  . ABDOMINAL HYSTERECTOMY    . CARDIAC CATHETERIZATION N/A 08/13/2015   Procedure: Left Heart Cath and Coronary Angiography;  Surgeon: Jettie Booze, MD;  Location: [place] [facility];  Service: Cardiovascular;  Laterality: N/A;  . CATARACT EXTRACTION    . CHOLECYSTECTOMY    . CORONARY ANGIOPLASTY WITH STENT PLACEMENT  2006   [place] DES to RCA, 50 % residual  . PARTIAL HYSTERECTOMY  1995   HPI:  76 yo female adm to [facility] with AMS - 76 y.o. female admitted with generalized weakness, hyponatremia.  MRI - for  acute infarct.  Hx of CAD, CHF, HTN, COPD, schizoaffective d/o, CKD, CVA, pancreatic lesion, polymyalgia rheumatica.  Pt also has h/o mild dysphagia MBS completed 09/2016 showed decreased timing of laryngeal closure and penetration of thin liquids with concerns for primary esophageal dysphagia.  Pt denies dysphagia at home.     Assessment / Plan / Recommendation Clinical Impression  Pt presents with clinical indications of possible pharyngeal dysphagia and ? esophageal deficits with POOR awareness.   Pt repeatedly stated she was "fine" while coughing with intake.    She does have h/o oral coordination/laryngeal timing deficits noted on prior MBS in 09/2016 resulting in laryngeal penetration.  However at that time, primary concern for swallowing was esophageal.  Today pt was observed with the end of her meal.    Immediate and delayed cough observed today with consumption of graham crackers and water. Pt had also consumed her entire meal immediately before session.    Pt with strong cough but does not expectorate secretions = rather swallows them.  Delayed throat clearing observed after intake of Ensure - ? Secretion retention in pharynx, ? Poor esophageal clearance.    But pt did complain of sensation of lodging on the right side of her neck stating this is abnormal because it's normally on the right.  Also question if high doses of prednisone may contribute to her dysphagia symptoms.   Pt  states liquids are the only thing that "gets in the windpipe" on occasion.   Suspect intermittent aspiration of liquids during swallow due to discoordination of swallow/laryngeal timing of closure. Chin tuck posture tested and pt denies improvement in symptoms with liquids.    SLP showed pt her prior MBS films and explained concerns using teach back. Would recommend to pursue esophagram due to concern for deficits noted on April 2018 MBS.  Will follow up for dysphagia management to help mitigate dysphagia.  Again pt's  decreased awareness may negatively impact compliance/generalization.      SLP Visit Diagnosis: Dysphagia, pharyngoesophageal phase (R13.14)    Aspiration Risk  Moderate aspiration risk    Diet Recommendation Dysphagia 3 (Mech soft);Thin liquid   Liquid Administration via: Cup;Straw Medication Administration: Whole meds with liquid Supervision: Patient able to self feed Postural Changes: Seated upright at 90 degrees;Remain upright for at least 30 minutes after po intake    Other  Recommendations Recommended Consults: Consider esophageal assessment Oral Care Recommendations: Oral care BID   Follow up Recommendations        Frequency and Duration min 1 x/week  1 week       Prognosis Prognosis for Safe Diet Advancement: Fair Barriers to Reach Goals: Time post onset      Swallow Study   General Date of Onset: 01/27/17 HPI: 76 yo female adm to [facility] with AMS - 76 y.o. female admitted with generalized weakness, hyponatremia.  MRI - for acute infarct.  Hx of CAD, CHF, HTN, COPD, schizoaffective d/o, CKD, CVA, pancreatic lesion, polymyalgia rheumatica.  Pt also has h/o mild dysphagia MBS completed 09/2016 showed decreased timing of laryngeal closure and penetration of thin liquids with concerns for primary esophageal dysphagia.  Pt denies dysphagia at home.   Type of Study: Bedside Swallow Evaluation Diet Prior to this Study: Dysphagia 1 (puree);Thin liquids Temperature Spikes Noted: No Respiratory Status: Room air History of Recent Intubation: No Behavior/Cognition: Alert;Cooperative;Pleasant mood Oral Cavity Assessment: Within Functional Limits Oral Care Completed by SLP: No Oral Cavity - Dentition: Dentures, top;Dentures, bottom Vision: Functional for self-feeding Self-Feeding Abilities: Able to feed self Patient Positioning: Upright in bed Baseline Vocal Quality: Normal Volitional Cough: Strong Volitional Swallow: Able to elicit    Oral/Motor/Sensory Function Overall Oral  Motor/Sensory Function: Within functional limits   Ice Chips Ice chips: Not tested   Thin Liquid Thin Liquid: Impaired Pharyngeal  Phase Impairments: Cough - Immediate    Nectar Thick Nectar Thick Liquid: Impaired Presentation: Straw Pharyngeal Phase Impairments: Cough - Delayed   Honey Thick Honey Thick Liquid: Not tested   Puree Puree: Not tested   Solid   GO   Solid: Impaired Pharyngeal Phase Impairments: Cough - Immediate;Cough - Delayed        Macario Golds 01/27/2017,1:05 PM  Luanna Salk, [facility] SLP [phone]

## 2017-01-27 NOTE — Progress Notes (Signed)
PROGRESS NOTE  Rebecca [name] QAS:[id number] DOB: 1940/09/17 DOA: 01/25/2017 PCP: [name], MD  Brief Narrative: 76 year old woman PMH TIA, coronary artery disease, MGUS, recent diagnosis pancreatic mass, presented with generalized weakness, questionable left-sided facial droop, family concerned for CVA. Workup revealed hyponatremia and probable pneumonia.  Assessment/Plan Multifocal pneumonia. Pattern not clearly suggestive of aspiration although possible. No hypoxia. No tachypnea. -Change to oral abx in AM.  Hyponatremia. Etiology unclear. SIADH suspected. However random cortisol was low, unclear what that the patient was taking her prednisone. Prednisone dose was increased. TSH within normal limits. -Essentially resolved.  TIA. Apparently has a slight left facial droop on admission. Patient was taken off Plavix in anticipation of ERCP to evaluate her pancreatic mass. MRI brain no acute findings. Carotid Doppler and 2-D echocardiogram unremarkable. Hemoglobin A1c 6.0. -Continue Plavix, statin. Check lipid panel.  Chronic leukocytosis, followed by hematology in the outpatient setting. Felt to be secondary to chronic prednisone therapy. Blood cultures no growth today.   Pancreatic mass, outpatient ERCP was planned and Plavix was discontinued, unfortunately TIA like symptoms, Plavix has been resumed. Will need outpatient follow-up. -Oncology not convinced that this is malignant. Felt to be unable to tolerate endoscopic ultrasound biopsy. MRCP suggests benign mass and recommends repeat CT with pancreatic protocol in 1 year.  Coronary artery disease. Stent placement 2006. -Continue Plavix, metoprolol, statin  Chronic kidney disease stage III -At baseline.  Anemia of chronic disease. -At baseline.  Polymyalgia rheumatica -Prednisone dose doubled this admission given low cortisol and hyponatremia.  COPD.  Generalized weakness. Physical and occupational therapy recommended  supervision but no OT/PT follow up  Aortic atherosclerosis.   Overall improving, likely discharge next 24 hours if continues to improve.  DVT prophylaxis: SCDs Code Status: full Family Communication: none Disposition Plan: home    Murray Hodgkins, MD  [facility] Direct contact: [phone] --[address] [place]  --[email or web address]; password [email or web address]  7PM-7AM contact night coverage as above 01/27/2017, 2:05 PM  LOS: 2 days   Consultants:  Oncology   Procedures:  Echo Study Conclusions  - Left ventricle: The cavity size was normal. Wall thickness was   increased in a pattern of moderate LVH. Systolic function was   normal. The estimated ejection fraction was in the range of 55%   to 60%. Wall motion was normal; there were no regional wall   motion abnormalities. Doppler parameters are consistent with   abnormal left ventricular relaxation (grade 1 diastolic   dysfunction). - Aortic valve: There was no stenosis. - Mitral valve: Mildly calcified annulus. There was trivial   regurgitation. - Right ventricle: The cavity size was normal. Systolic function   was normal. - Pulmonary arteries: PA peak pressure: 24 mm Hg (S). - Systemic veins: IVC measured 2.0 cm with < 50% respirophasic   variation, suggesting RA pressure 8 mmHg.  Impressions:  - Normal LV size with moderate LV hypertrophy, EF 55-60%. Normal RV   size and systolic function. No significant valvular   abnormalities.  Antimicrobials: Azithromycin 8/14 >> Ceftriaxone 8/14, 8/16 >>  Interval history/Subjective: Physical therapy evaluated today, noted the patient is progressing well with mobility.  Feels well. No complaints.  Objective: Vitals: Afebrile, 98.0, 18, 62, 142/48, 97% on room air  Exam:     Constitutional: Appears calm, comfortable.  Respiratory. Clear to auscultation bilaterally. No wheezes, rales or rhonchi. Normal respiratory effort.  Cardiovascular. Regular rate and rhythm. No  murmur, rub or gallop. No lower extremity edema.  Neurologic. Cranial nerves II-12 appear  intact.  Psychiatric. Grossly normal mood and affect. Speech fluent and appropriate.   I have personally reviewed the following:   Labs:  Capillary blood sugars stable  Sodium 134, BUN trending down, 24, creatinine trending down, 1.21.  Hemoglobin stable during this admission.  TSH within normal limits  Imaging studies:  MRI brain no acute abnormalities  CT chest increased consolidation left lower lobe with multifocal groundglass density left upper and right lower lobe suspicious for multifocal pneumonia. Right lung nodularity in left lower lobe nodules have decreased. MRCP cystic lesion within the body of the pancreas slowly enlarging since 2013, felt to be benign. Follow-up pancreatic protocol CT one year recommended.   Medical tests:  EKG sinus rhythm   Scheduled Meds: . clopidogrel  75 mg Oral QHS  . cloZAPine  300 mg Oral QHS  . diltiazem  180 mg Oral Daily  . feeding supplement (ENSURE ENLIVE)  237 mL Oral Q24H  . heparin  5,000 Units Subcutaneous Q8H  . isosorbide mononitrate  60 mg Oral Daily  . levothyroxine  75 mcg Oral QAC breakfast  . metoprolol succinate  100 mg Oral Q breakfast  . mometasone-formoterol  2 puff Inhalation BID  . pantoprazole  40 mg Oral Daily  . polyethylene glycol  17 g Oral Daily  . pravastatin  20 mg Oral QHS  . predniSONE  20 mg Oral Q breakfast  . tiotropium  1 capsule Inhalation q morning - 10a   Continuous Infusions: . sodium chloride 10 mL/hr at 01/26/17 1446  . azithromycin Stopped (01/27/17 0835)  . cefTRIAXone (ROCEPHIN)  IV Stopped (01/27/17 0700)    Principal Problem:   Hyponatremia Active Problems:   Hypothyroidism   Dyslipidemia   Anemia in chronic renal disease   GASTROESOPHAGEAL REFLUX, NO ESOPHAGITIS   Polymyalgia rheumatica (HCC)   History of TIA - May 2015- Plavix   Leukocytosis   Coronary artery disease involving  native coronary artery of native heart without angina pectoris   Essential hypertension   Pneumonia   Generalized weakness   TIA (transient ischemic attack)   Pancreatic mass   Aortic atherosclerosis ([place])   LOS: 2 days

## 2017-01-27 NOTE — Consult Note (Signed)
   [facility] CM Inpatient Consult   01/27/2017  DEANIE JUPITER 1940-08-23 [id number]    Spoke with Ms. Sherfield at bedside to explain and offer [place] Management program services.   However, she was about to go down for testing. Provided [facility] brochure and 24hr nurse line magnet.   Will follow up at later time.    Marthenia Rolling, MSN-Ed, RN,BSN [facility] Liaison [phone]

## 2017-01-27 NOTE — Telephone Encounter (Signed)
Pt has been cancelled for EUS, scheduling has been notified.

## 2017-01-27 NOTE — Telephone Encounter (Signed)
-----   Message from Milus Banister, MD sent at 01/27/2017  3:32 PM EDT ----- Regarding: RE: MRI scan/cyst pancreas [name], I saw she was admitted with a TIA and completely agree with your plan.  Thank you for the word. We will cancel her currently scheduled EUS for the 30th.  dj  Cordarrius Coad, Can you cancel her 9/30 EUS.  Thanks     ----- Message ----- From: Heath Lark, MD Sent: 01/27/2017   3:05 PM To: Milus Banister, MD Subject: MRI scan/cyst pancreas                         Jacqulyn Cane,  FYI: I ordered MRCP. Cyst appeared benign. I do not believe she can tolerate EUS/biopsy. I plan to just follow with CT in 1 year.  Is that OK with you? She is scheduled for biopsy at the end of the month with you  Thanks for your input  Ni

## 2017-01-27 NOTE — Progress Notes (Signed)
Rebecca [name]   DOB:04-10-1941   [id number]    Subjective: She feels fine.  There is no reported fever.  She is still battling with severe constipation.  Objective:  Vitals:   01/27/17 0611 01/27/17 1409  BP: (!) 142/48 (!) 145/57  Pulse: 62 79  Resp: 18 18  Temp: 98.8 F (37.1 C) 98.7 F (37.1 C)  SpO2: 97% 99%     Intake/Output Summary (Last 24 hours) at 01/27/17 1539 Last data filed at 01/27/17 1300  Gross per 24 hour  Intake          1088.17 ml  Output              753 ml  Net           335.17 ml    GENERAL:alert, no distress and comfortable.  She appears cushingoid SKIN: Extensive skin bruising.  EYES: normal, Conjunctiva are pink and non-injected, sclera clear OROPHARYNX:no exudate, no erythema and lips, buccal mucosa, and tongue normal  Musculoskeletal:no cyanosis of digits and no clubbing  NEURO: alert & oriented x 3 with fluent speech, no focal motor/sensory deficits   Labs:  Lab Results  Component Value Date   WBC 17.1 (H) 01/26/2017   HGB 10.3 (L) 01/26/2017   HCT 29.6 (L) 01/26/2017   MCV 88.9 01/26/2017   PLT 226 01/26/2017   NEUTROABS 19.5 (H) 01/25/2017    Lab Results  Component Value Date   NA 134 (L) 01/27/2017   K 4.2 01/27/2017   CL 101 01/27/2017   CO2 26 01/27/2017    Studies:  Dg Chest 2 View  Result Date: 01/25/2017 CLINICAL DATA:  Headache.  Lethargy. EXAM: CHEST  2 VIEW COMPARISON:  01/16/2017 FINDINGS: The heart size is normal. Persistent left lung base opacity compatible with atelectasis and/or pneumonia. Right lung is clear. No pleural effusions. IMPRESSION: 1. Persistent left lung base opacity which may represent atelectasis or pneumonia. Electronically Signed   By: Kerby Moors M.D.   On: 01/25/2017 16:23   Ct Head Wo Contrast  Result Date: 01/25/2017 CLINICAL DATA:  Lethargy and headache. Left-sided facial droop, transient EXAM: CT HEAD WITHOUT CONTRAST TECHNIQUE: Contiguous axial images were obtained from the base of the skull  through the vertex without intravenous contrast. COMPARISON:  Oct 19, 2016 FINDINGS: Brain: There is mild diffuse atrophy. There is no intracranial mass, hemorrhage, extra-axial fluid collection, or midline shift. There is patchy small vessel disease in the centra semiovale bilaterally, stable. There is no new gray-white compartment lesion. No acute infarct evident. Vascular: There is no appreciable hyperdense vessel. There is calcification in each carotid siphon region. Skull: Bony calvarium appears intact. Sinuses/Orbits: Visualized paranasal sinuses are clear. Visualized orbits appear symmetric bilaterally. Other: There is opacification in several inferior mastoid air cells on the left, stable. Mastoids elsewhere are clear. IMPRESSION: Atrophy with stable patchy supratentorial small vessel disease. No intracranial mass, hemorrhage, or extra-axial fluid collection. No acute infarct evident. There are foci of arterial vascular calcification. There is mild chronic inferior mastoid air cell disease on the left, stable. Electronically Signed   By: Lowella Grip III M.D.   On: 01/25/2017 16:31   Ct Chest Wo Contrast  Result Date: 01/26/2017 CLINICAL DATA:  Follow-up nodules on prior CT, pneumonia, pancreas mass EXAM: CT CHEST WITHOUT CONTRAST TECHNIQUE: Multidetector CT imaging of the chest was performed following the standard protocol without IV contrast. COMPARISON:  01/25/2017 radiograph, CT chest 12/08/2016, 10/08/2016 FINDINGS: Cardiovascular: Limited evaluation without intravenous contrast. Heavily calcified  aorta with tortuous contour of the aortic arch, unchanged. Calcifications within the great vessels. Coronary artery calcifications. Normal heart size. No pericardial effusion. Mediastinum/Nodes: No significantly enlarged mediastinal lymph nodes. Limited evaluation for hilar nodes without contrast. Stable appearance of thyroid gland and trachea. Esophagus within normal limits. Lungs/Pleura: Resolved sub  pleural nodularity on the right since prior study. The tiny pulmonary nodules in the left lower lobe are not discretely visualized, however there is progressive patchy consolidation in the left lower lobe since the prior study. Development of multifocal ground-glass densities within the right lower lobe, and left upper lobe. No large pleural effusion. Negative for pneumothorax. Upper Abdomen: Calcified thrombus or dissection flap at the aortic hiatus unchanged. Cyst within the upper pole of left kidney anteriorly. Patient's pancreas mass is not imaged. Surgical clips in the gallbladder fossa. Musculoskeletal: Degenerative changes. No acute or suspicious bone lesion. IMPRESSION: 1. Increased consolidation in the left lower lobe with multifocal ground-glass density in the left upper and right lower lobes, suspicious for multifocal pneumonia. Previously described right lung nodularity and left lower lobe nodules have decreased. Aortic Atherosclerosis (ICD10-I70.0). Electronically Signed   By: Donavan Foil M.D.   On: 01/26/2017 23:22   Mr Brain Wo Contrast  Result Date: 01/25/2017 CLINICAL DATA:  Headache and weakness.  Left-sided facial droop. EXAM: MRI HEAD WITHOUT CONTRAST TECHNIQUE: Multiplanar, multiecho pulse sequences of the brain and surrounding structures were obtained without intravenous contrast. COMPARISON:  Head CT 01/25/2017 FINDINGS: Brain: The midline structures are normal. There is no focal diffusion restriction to indicate acute infarct. There is confluent hyperintense T2-weighted signal within the periventricular white matter, most often seen in the setting of chronic microvascular ischemia. No intraparenchymal hematoma or chronic microhemorrhage. No lobar predominant atrophy. The dura is normal and there is no extra-axial collection. Vascular: Major intracranial arterial and venous sinus flow voids are preserved. Skull and upper cervical spine: The visualized skull base, calvarium, upper  cervical spine and extracranial soft tissues are normal. Sinuses/Orbits: No fluid levels or advanced mucosal thickening. No mastoid or middle ear effusion. Normal orbits. IMPRESSION: 1. No acute abnormality. 2. Advanced chronic microvascular ischemia. Electronically Signed   By: Ulyses Jarred M.D.   On: 01/25/2017 21:13   Mr Abdomen Mrcp Wo Contrast  Result Date: 01/27/2017 CLINICAL DATA:  Evaluate pancreatic cyst seen on prior CT abdomen. EXAM: MRI ABDOMEN WITHOUT CONTRAST  (INCLUDING MRCP) TECHNIQUE: Multiplanar multisequence MR imaging of the abdomen was performed. Heavily T2-weighted images of the biliary and pancreatic ducts were obtained, and three-dimensional MRCP images were rendered by post processing. COMPARISON:  CT scans 01/16/2017 and 09/25/2015 FINDINGS: Examination is limited by breathing motion artifact. Patient was unable hold her breath. Patient also refused contrast. Lower chest: Chronic basilar scarring changes. The heart is mildly enlarged. Hepatobiliary: No focal hepatic lesions or intrahepatic biliary dilatation. Normal caliber and course of the common bile duct. Low insertion of the cystic duct remnant. Status post cholecystectomy. 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 Lesion is exophytic. It appears well circumscribed and encapsulated. No obvious septations or nodularity. No surrounding inflammation. This has not significantly changed since the prior CT scan from April 2017. It was also present on a remote CT scan from 2013 but was much smaller. It is most likely a benign lesion. I do not think followup but MRI is an option in this patient. I would recommend a followup pancreatic protocol CT scan in 1 year. Mild to moderate fatty change involving the pancreas.  No ductal dilatation. No other lesions. Spleen:  Normal size.  No focal lesions. Adrenals/Urinary Tract: Simple appearing bilateral renal cysts. No worrisome renal lesions. The adrenal glands appear  normal. Stomach/Bowel: The stomach, duodenum and visualized small bowel are grossly normal. Distended stool-filled colon is again noted and suggests chronic constipation. Vascular/Lymphatic: Advanced atherosclerotic calcifications involving the aorta and branch vessels without focal aneurysm. No mesenteric or retroperitoneal mass are adenopathy. Other:  No ascites or abdominal wall hernia. Musculoskeletal: No significant bony findings. IMPRESSION: 1. 2.9 x 2.7 cm cystic lesion associated with the body region of the pancreas slowly enlarging since 2013. This is likely benign and there are no worrisome MR imaging features although the examination is limited by lack of contrast which the patient refused. Recommend followup pancreatic protocol CT scan and 1 year. 2. Status post cholecystectomy.  Normal pancreaticobiliary tree. 3. Simple appearing bilateral renal cysts. Electronically Signed   By: Marijo Sanes M.D.   On: 01/27/2017 11:35   Mr 3d Recon At Scanner  Result Date: 01/27/2017 CLINICAL DATA:  Evaluate pancreatic cyst seen on prior CT abdomen. EXAM: MRI ABDOMEN WITHOUT CONTRAST  (INCLUDING MRCP) TECHNIQUE: Multiplanar multisequence MR imaging of the abdomen was performed. Heavily T2-weighted images of the biliary and pancreatic ducts were obtained, and three-dimensional MRCP images were rendered by post processing. COMPARISON:  CT scans 01/16/2017 and 09/25/2015 FINDINGS: Examination is limited by breathing motion artifact. Patient was unable hold her breath. Patient also refused contrast. Lower chest: Chronic basilar scarring changes. The heart is mildly enlarged. Hepatobiliary: No focal hepatic lesions or intrahepatic biliary dilatation. Normal caliber and course of the common bile duct. Low insertion of the cystic duct remnant. Status post cholecystectomy. 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 Lesion is exophytic. It appears well circumscribed and encapsulated. No obvious  septations or nodularity. No surrounding inflammation. This has not significantly changed since the prior CT scan from April 2017. It was also present on a remote CT scan from 2013 but was much smaller. It is most likely a benign lesion. I do not think followup but MRI is an option in this patient. I would recommend a followup pancreatic protocol CT scan in 1 year. Mild to moderate fatty change involving the pancreas. No ductal dilatation. No other lesions. Spleen:  Normal size.  No focal lesions. Adrenals/Urinary Tract: Simple appearing bilateral renal cysts. No worrisome renal lesions. The adrenal glands appear normal. Stomach/Bowel: The stomach, duodenum and visualized small bowel are grossly normal. Distended stool-filled colon is again noted and suggests chronic constipation. Vascular/Lymphatic: Advanced atherosclerotic calcifications involving the aorta and branch vessels without focal aneurysm. No mesenteric or retroperitoneal mass are adenopathy. Other:  No ascites or abdominal wall hernia. Musculoskeletal: No significant bony findings. IMPRESSION: 1. 2.9 x 2.7 cm cystic lesion associated with the body region of the pancreas slowly enlarging since 2013. This is likely benign and there are no worrisome MR imaging features although the examination is limited by lack of contrast which the patient refused. Recommend followup pancreatic protocol CT scan and 1 year. 2. Status post cholecystectomy.  Normal pancreaticobiliary tree. 3. Simple appearing bilateral renal cysts. Electronically Signed   By: Marijo Sanes M.D.   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  Monitor closely  Anemia chronic disease This is likely anemia of chronic disease. The patient denies recent history of bleeding such as epistaxis, hematuria or hematochezia. She is asymptomatic from the anemia. We will observe for now.  She does not require transfusion now.   Chronic leukocytosis Likely due to chronic prednisone therapy.  It is not clear that she may have active infection.  History of recurrent aspiration pneumonia She is undergoing swallow evaluation. Continue antibiotics  Recurrent TIA She has no focal neurological deficit.  MRI and CT scan of the head is negative  Chronic constipation Patient requested Fleet enema.  She is also on chronic laxative therapy  Goals of care discussion I reviewed imaging study and overall situation with her daughter, [name] She expressed concern about her mother's well-being The patient has poor memory and it is not clear that she could verbalize her needs [name] felt that palliative care consult would be appropriate She is considering whether she can have palliative care involvement soon to address goals of care She is considering changing CODE STATUS but the patient has indicated to be full code in the past The patient had repeated admission to the hospital for various issues and is very frail I will proceed to consult palliative care for symptom management, pain management and others They can start discussion about CODE STATUS with the patient and family members in the near future whether as inpatient or outpatient.  Discharge planning Hopefully within the next 24-48 hours She has appointment to see me next month I will sign off.  Please call if questions arise  Heath Lark, MD 01/27/2017  3:39 PM

## 2017-01-28 ENCOUNTER — Ambulatory Visit: Payer: Medicare Other | Admitting: Physician Assistant

## 2017-01-28 DIAGNOSIS — Z515 Encounter for palliative care: Secondary | ICD-10-CM

## 2017-01-28 DIAGNOSIS — K5909 Other constipation: Secondary | ICD-10-CM

## 2017-01-28 DIAGNOSIS — Z7189 Other specified counseling: Secondary | ICD-10-CM

## 2017-01-28 DIAGNOSIS — J189 Pneumonia, unspecified organism: Secondary | ICD-10-CM

## 2017-01-28 LAB — LIPID PANEL
CHOL/HDL RATIO: 2.1 ratio
Cholesterol: 135 mg/dL (ref 0–200)
HDL: 64 mg/dL (ref >40)
LDL Cholesterol: 57 mg/dL (ref 0–99)
Triglycerides: 72 mg/dL (ref <150)
VLDL: 14 mg/dL (ref 0–40)

## 2017-01-28 MED ORDER — AZITHROMYCIN 500 MG PO TABS: 500.0000 mg | ORAL_TABLET | Freq: Every day | ORAL | 0 refills | Status: DC

## 2017-01-28 MED ORDER — POLYETHYLENE GLYCOL 3350 17 GM/SCOOP PO POWD: 17.0000 g | Freq: Two times a day (BID) | ORAL | Status: AC

## 2017-01-28 MED ORDER — SENNA 8.6 MG PO TABS: 2.0000 | ORAL_TABLET | Freq: Every day | ORAL | Status: DC

## 2017-01-28 MED ORDER — BISACODYL 10 MG RE SUPP
10.0000 mg | Freq: Every day | RECTAL | Status: DC
  Administered 2017-01-28: 10 mg via RECTAL
  Filled 2017-01-28: qty 1

## 2017-01-28 MED ORDER — MORPHINE SULFATE (CONCENTRATE) 10 MG/0.5ML PO SOLN: 5.0000 mg | ORAL | Status: DC | PRN

## 2017-01-28 MED ORDER — CEFUROXIME AXETIL 500 MG PO TABS: 500.0000 mg | ORAL_TABLET | Freq: Two times a day (BID) | ORAL | 0 refills | Status: AC

## 2017-01-28 NOTE — Progress Notes (Signed)
Patient's daughter selected [facility]. Referral given to Amy, RN in house rep.

## 2017-01-28 NOTE — Progress Notes (Signed)
D/cing home w/ hospice w/ son.All d/c instructions given w/ verbal understanding.

## 2017-01-28 NOTE — Progress Notes (Signed)
Awaiting for son to pick her up.

## 2017-01-28 NOTE — Progress Notes (Signed)
Farrel Gordon, RN , CCM, [facility] Liaison called to inform me of pt going home with [facility], son will transport pt home. Please see notes from Farrel Gordon, RN.

## 2017-01-28 NOTE — Progress Notes (Signed)
Physical Therapy Treatment Patient Details Name: Rebecca [name] MRN: [id number] DOB: 07/23/1940 Today's Date: 01/28/2017    History of Present Illness  76 y.o. female admitted with generalized weakness, hyponatremia.  MRI - for acute infarct.  Hx of CAD, CHF, HTN, COPD, schizoaffective d/o, CKD, CVA, pancreatic lesion, polymyalgia rheumatica    PT Comments    Assisted OOB to amb to bathroom.  Pt required increased time in bathroom.  Assisted with sit to stand several times to perform hygiene as pt demonstrated increased fatigue with increased activity.  Pt declined to amb in hallway due to fatigue.  Assisted back to bed and positioned to comfort.   Follow Up Recommendations  Supervision/Assistance - 24 hour     Equipment Recommendations  None recommended by PT    Recommendations for Other Services       Precautions / Restrictions Precautions Precautions: Fall Restrictions Weight Bearing Restrictions: No    Mobility  Bed Mobility Overal bed mobility: Modified Independent             General bed mobility comments: able to self perform in and OOB   Transfers   Equipment used: Rolling walker (2 wheeled) Transfers: Sit to/from [facility] Sit to Stand: Supervision Stand pivot transfers: Supervision       General transfer comment: for safety. VCs hand placement      also assisted in bathroom on/off toilet   Ambulation/Gait Ambulation/Gait assistance: Supervision;Min guard Ambulation Distance (Feet): 24 Feet (12 feet x 2 to and from bathroom only per pt request) Assistive device: Rolling walker (2 wheeled) Gait Pattern/deviations: Step-to pattern;Step-through pattern Gait velocity: decreased   General Gait Details: mild instability with turns   Financial trader Rankin (Stroke Patients Only)       Balance                                            Cognition Arousal/Alertness:  Awake/alert Behavior During Therapy: WFL for tasks assessed/performed Overall Cognitive Status: Within Functional Limits for tasks assessed                                        Exercises      General Comments        Pertinent Vitals/Pain Pain Assessment: No/denies pain    Home Living                      Prior Function            PT Goals (current goals can now be found in the care plan section) Progress towards PT goals: Progressing toward goals    Frequency    Min 3X/week      PT Plan Current plan remains appropriate    Co-evaluation              AM-PAC PT "6 Clicks" Daily Activity  Outcome Measure  Difficulty turning over in bed (including adjusting bedclothes, sheets and blankets)?: None Difficulty moving from lying on back to sitting on the side of the bed? : None   Help needed moving to and from a bed to chair (including a wheelchair)?: A Little Help needed walking in hospital room?:  A Little Help needed climbing 3-5 steps with a railing? : A Little 6 Click Score: 17    End of Session Equipment Utilized During Treatment: Gait belt Activity Tolerance: Patient tolerated treatment well Patient left: in bed;with call bell/phone within reach   PT Visit Diagnosis: Muscle weakness (generalized) (M62.81);Difficulty in walking, not elsewhere classified (R26.2)     Time: 8757-9728 PT Time Calculation (min) (ACUTE ONLY): 24 min  Charges:  $Gait Training: 8-22 mins $Therapeutic Activity: 8-22 mins                    G Codes:       Rica Koyanagi  PTA [facility]  Acute  Rehab Pager      [phone]

## 2017-01-28 NOTE — Progress Notes (Signed)
CSW informed of hospice referral- [facility] liaison coming to [place] to evaluate- CSW will continue to follow and assist as needed  Jorge Ny, [place] Social Worker [phone]

## 2017-01-28 NOTE — Discharge Summary (Addendum)
Physician Discharge Summary  Rebecca [name] RWE:[id number] DOB: [birth year] DOA: 01/25/2017  PCP: [name], MD  Admit date: 01/25/2017 Discharge date: 01/28/2017  Recommendations for Outpatient Follow-up:  1. Pancreatic mass, this will be followed by her oncologist. 2. Possible TIA. Continue Plavix and statin. 3. Resolution of pneumonia. 4. Chronic leukocytosis.  5. Chronic constipation.  Follow-up Information    [name], MD. Schedule an appointment as soon as possible for a visit in 1 week(s).   Specialty:  Internal Medicine Contact information: [address] [place] [address] [phone]            Discharge Diagnoses:  1. Multifocal pneumonia 2. Hyponatremia, possible SIADH 3. Possible TIA 4. Chronic leukocytosis 5. Pancreatic mass  6. Coronary artery disease 7. Chronic kidney disease stage III 8. Aortic atherosclerosis  Discharge Condition: improved Disposition: home with family, [facility]  Diet recommendation: Dysphagia 3 (Mech soft);Thin liquid   Filed Weights   01/25/17 1828 01/28/17 1450  Weight: 63 kg (139 lb) 63 kg (139 lb)    History of present illness:  76 year old woman PMH TIA, coronary artery disease, MGUS, recent diagnosis pancreatic mass, presented with generalized weakness, questionable left-sided facial droop, family concerned for CVA. Workup revealed hyponatremia and probable pneumonia.  Hospital Course:  MRI brain was negative and no neurologic deficits were noted after admission. She was evaluated by physical therapy and occupational therapy with no follow-up recommended. Evaluated by speech therapy with recommendation for dysphagia 3 diet with thin liquids. There was concern for esophageal dysmotility, an esophagram was performed that showed only mild dysmotility. Patient was treated with empiric antibiotics and improved rapidly. Pancreatic mass was further evaluated with MRCP and is likely  benign. This was followed by oncology in the outpatient setting. No ERCP is now planned. Chronic comorbidities including chronic leukocytosis, coronary artery disease, chronic kidney disease stage III, anemia, polymyalgia rheumatica remains stable. Individual issues as below.  Discussed in detail with daughter by telephone. She expressed interest in hospice and referral has been made. We also discussed the patient's constipation is documented on several CT scans. We discussed the importance of a daily bowel movement.  Multifocal pneumonia. Pattern not clearly suggestive of aspiration although possible. No hypoxia. No tachypnea. -Complete oral antibiotics as an outpatient  Hyponatremia. Etiology unclear. SIADH suspected. However random cortisol was low, unclear what that the patient was taking her prednisone. Prednisone dose was increased. TSH within normal limits. -Essentially resolved. Follow-up as an outpatient.  TIA. Apparently has a slight left facial droop on admission. Patient was taken off Plavix in anticipation of ERCP to evaluate her pancreatic mass. MRI brain no acute findings. Carotid Doppler and 2-D echocardiogram unremarkable. Hemoglobin A1c 6.0. -Continue Plavix, statin. LDL 57. Total cholesterol 135.  Chronic leukocytosis, followed by hematology in the outpatient setting. Felt to be secondary to chronic prednisone therapy. Blood cultures no growth today.   Pancreatic mass, outpatient ERCP was planned and Plavix was discontinued. Given TIA like symptoms, Plavix has been resumed.  -Oncology not convinced that this is malignant. Felt to be unable to tolerate endoscopic ultrasound biopsy. MRCP suggests benign mass and recommends repeat CT with pancreatic protocol in 1 year.  Coronary artery disease. Stent placement 2006. -Continue Plavix, metoprolol, statin  Chronic kidney disease stage III -At baseline.  Anemia of chronic disease. -At baseline.  Generalized weakness.  Physical and occupational therapy recommended supervision but no OT/PT follow up  Consultants:  Oncology   Procedures:  Echo Study Conclusions  -  Left ventricle: The cavity size was normal. Wall thickness was increased in a pattern of moderate LVH. Systolic function was normal. The estimated ejection fraction was in the range of 55% to 60%. Wall motion was normal; there were no regional wall motion abnormalities. Doppler parameters are consistent with abnormal left ventricular relaxation (grade 1 diastolic dysfunction). - Aortic valve: There was no stenosis. - Mitral valve: Mildly calcified annulus. There was trivial regurgitation. - Right ventricle: The cavity size was normal. Systolic function was normal. - Pulmonary arteries: PA peak pressure: 24 mm Hg (S). - Systemic veins: IVC measured 2.0 cm with < 50% respirophasic variation, suggesting RA pressure 8 mmHg.  Impressions:  - Normal LV size with moderate LV hypertrophy, EF 55-60%. Normal RV size and systolic function. No significant valvular abnormalities.  Antimicrobials: Azithromycin 8/14 >> 8/18 Ceftriaxone 8/14, 8/16 >> 8/17 Ceftin 8/18 >> 8/19   Today's assessment: S: Feels well. Bowels moving. Breathing fine. Eating well. Notes some abdominal pain. O: Vitals: Afebrile, vital signs stable. 98.2, 18, 67, 139/68, 97% on room air.   Constitutional. Appears calm, comfortable.  Respiratory. Few rhonchi. No wheezes or rales. Normal respiratory effort. Speaks in full sentences.  Cardiovascular. Regular rate and rhythm. No murmur, rub or gallop.  Abdomen. Soft, nontender, positive bowel sounds.  Psychiatric. Grossly normal mood and affect. Speech fluent and appropriate.  Esophagram noted.  Discharge Instructions  Discharge Instructions    Discharge instructions    Complete by:  As directed    Call your physician or seek immediate medical attention for shortness of breath,  fever, pain, worsening constipation. Recommend mechanical soft diet.   Increase activity slowly    Complete by:  As directed      Allergies as of 01/28/2017      Reactions   Levaquin [levofloxacin] Other (See Comments)   Ruptured tendon   Tape Other (See Comments)   PATIENT'S SKIN IS VERY, VERY THIN AND WILL TEAR AND BRUISE EASILY; PLEASE USE AN ALTERNATIVE (SOMETHING OTHER THAN TAPE)      Medication List    STOP taking these medications   NEOMYCIN-POLYMYXIN-HYDROCORTISONE 1 % Soln OTIC solution Commonly known as:  CORTISPORIN     TAKE these medications   acetaminophen 500 MG tablet Commonly known as:  TYLENOL Take 1 tablet (500 mg total) by mouth every 6 (six) hours as needed (pain). What changed:  how much to take  when to take this  reasons to take this   azithromycin 500 MG tablet Commonly known as:  ZITHROMAX Take 1 tablet (500 mg total) by mouth daily. Take 8/18 in the morning   cefUROXime 500 MG tablet Commonly known as:  CEFTIN Take 1 tablet (500 mg total) by mouth 2 (two) times daily. Start 8/18 in the morning.   cetirizine 10 MG tablet Commonly known as:  ZYRTEC Take 10 mg by mouth at bedtime.   clopidogrel 75 MG tablet Commonly known as:  PLAVIX Take 1 tablet (75 mg total) by mouth daily with breakfast. What changed:  when to take this   cloZAPine 100 MG tablet Commonly known as:  CLOZARIL Take 300 mg by mouth at bedtime.   COLACE 100 MG capsule Generic drug:  docusate sodium Take 200 mg by mouth at bedtime.   Cranberry 250 MG Tabs Take 1 tablet by mouth at bedtime.   denosumab 60 MG/ML Soln injection Commonly known as:  PROLIA Inject 60 mg into the skin every 6 (six) months. Administer in upper arm, thigh, or abdomen  diltiazem 180 MG 24 hr tablet Commonly known as:  CARDIZEM LA Take 180 mg by mouth daily with breakfast.   Fluticasone-Salmeterol 250-50 MCG/DOSE Aepb Commonly known as:  ADVAIR Inhale 1 puff into the lungs at bedtime.     furosemide 20 MG tablet Commonly known as:  LASIX Take 1 tablet (20 mg total) by mouth daily as needed. What changed:  reasons to take this   GUAIFENESIN PO Take 1 tablet by mouth daily.   ipratropium-albuterol 0.5-2.5 (3) MG/3ML Soln Commonly known as:  DUONEB Inhale 3 mLs into the lungs 3 (three) times daily.   isosorbide mononitrate 60 MG 24 hr tablet Commonly known as:  IMDUR Take 1 tablet (60 mg total) by mouth daily.   levothyroxine 75 MCG tablet Commonly known as:  SYNTHROID, LEVOTHROID Take 75 mcg by mouth daily before breakfast.   metoprolol succinate 100 MG 24 hr tablet Commonly known as:  TOPROL-XL Take 100 mg by mouth daily with breakfast. Take with or immediately following a meal.   multivitamin with minerals tablet Take 1 tablet by mouth at bedtime.   nitrofurantoin 100 MG capsule Commonly known as:  MACRODANTIN Take 100 mg by mouth at bedtime. Filled 01/11/17 x 30 days   nitroGLYCERIN 0.4 MG SL tablet Commonly known as:  NITROSTAT Place 1 tablet (0.4 mg total) under the tongue every 5 (five) minutes as needed. For chest pain   ondansetron 4 MG tablet Commonly known as:  ZOFRAN Take 1 tablet (4 mg total) by mouth every 8 (eight) hours as needed for nausea or vomiting.   pantoprazole 40 MG tablet Commonly known as:  PROTONIX Take 1 tablet (40 mg total) by mouth daily.   polyethylene glycol powder powder Commonly known as:  GLYCOLAX/MIRALAX Take 17 g by mouth 2 (two) times daily. MIX AND DRINK What changed:  when to take this   pravastatin 20 MG tablet Commonly known as:  PRAVACHOL Take 20 mg by mouth at bedtime.   predniSONE 10 MG tablet Commonly known as:  DELTASONE Take 1 tablet (10 mg total) by mouth daily with breakfast.   PROBIOTIC PO Take 1 capsule by mouth daily.   senna 8.6 MG Tabs tablet Commonly known as:  SENOKOT Take 2 tablets (17.2 mg total) by mouth at bedtime.   SPIRIVA RESPIMAT 2.5 MCG/ACT Aers Generic drug:  Tiotropium  Bromide Monohydrate Inhale 2 puffs into the lungs every morning.   traMADol 50 MG tablet Commonly known as:  ULTRAM Take 100 mg by mouth 2 (two) times daily as needed for pain. What changed:  Another medication with the same name was removed. Continue taking this medication, and follow the directions you see here.   Vitamin D (Ergocalciferol) 50000 units Caps capsule Commonly known as:  DRISDOL Take 1 capsule (50,000 Units total) by mouth every 7 (seven) days.      Allergies  Allergen Reactions  . Levaquin [Levofloxacin] Other (See Comments)    Ruptured tendon  . Tape Other (See Comments)    PATIENT'S SKIN IS VERY, VERY THIN AND WILL TEAR AND BRUISE EASILY; PLEASE USE AN ALTERNATIVE (SOMETHING OTHER THAN TAPE)    The results of significant diagnostics from this hospitalization (including imaging, microbiology, ancillary and laboratory) are listed below for reference.    Significant Diagnostic Studies: Dg Chest 2 View  Result Date: 01/25/2017 CLINICAL DATA:  Headache.  Lethargy. EXAM: CHEST  2 VIEW COMPARISON:  01/16/2017 FINDINGS: The heart size is normal. Persistent left lung base opacity compatible with atelectasis and/or  pneumonia. Right lung is clear. No pleural effusions. IMPRESSION: 1. Persistent left lung base opacity which may represent atelectasis or pneumonia. Electronically Signed   By: Kerby Moors M.D.   On: 01/25/2017 16:23   Ct Head Wo Contrast  Result Date: 01/25/2017 CLINICAL DATA:  Lethargy and headache. Left-sided facial droop, transient EXAM: CT HEAD WITHOUT CONTRAST TECHNIQUE: Contiguous axial images were obtained from the base of the skull through the vertex without intravenous contrast. COMPARISON:  Oct 19, 2016 FINDINGS: Brain: There is mild diffuse atrophy. There is no intracranial mass, hemorrhage, extra-axial fluid collection, or midline shift. There is patchy small vessel disease in the centra semiovale bilaterally, stable. There is no new gray-white  compartment lesion. No acute infarct evident. Vascular: There is no appreciable hyperdense vessel. There is calcification in each carotid siphon region. Skull: Bony calvarium appears intact. Sinuses/Orbits: Visualized paranasal sinuses are clear. Visualized orbits appear symmetric bilaterally. Other: There is opacification in several inferior mastoid air cells on the left, stable. Mastoids elsewhere are clear. IMPRESSION: Atrophy with stable patchy supratentorial small vessel disease. No intracranial mass, hemorrhage, or extra-axial fluid collection. No acute infarct evident. There are foci of arterial vascular calcification. There is mild chronic inferior mastoid air cell disease on the left, stable. Electronically Signed   By: Lowella Grip III M.D.   On: 01/25/2017 16:31   Ct Chest Wo Contrast  Result Date: 01/26/2017 CLINICAL DATA:  Follow-up nodules on prior CT, pneumonia, pancreas mass EXAM: CT CHEST WITHOUT CONTRAST TECHNIQUE: Multidetector CT imaging of the chest was performed following the standard protocol without IV contrast. COMPARISON:  01/25/2017 radiograph, CT chest 12/08/2016, 10/08/2016 FINDINGS: Cardiovascular: Limited evaluation without intravenous contrast. Heavily calcified aorta with tortuous contour of the aortic arch, unchanged. Calcifications within the great vessels. Coronary artery calcifications. Normal heart size. No pericardial effusion. Mediastinum/Nodes: No significantly enlarged mediastinal lymph nodes. Limited evaluation for hilar nodes without contrast. Stable appearance of thyroid gland and trachea. Esophagus within normal limits. Lungs/Pleura: Resolved sub pleural nodularity on the right since prior study. The tiny pulmonary nodules in the left lower lobe are not discretely visualized, however there is progressive patchy consolidation in the left lower lobe since the prior study. Development of multifocal ground-glass densities within the right lower lobe, and left upper  lobe. No large pleural effusion. Negative for pneumothorax. Upper Abdomen: Calcified thrombus or dissection flap at the aortic hiatus unchanged. Cyst within the upper pole of left kidney anteriorly. Patient's pancreas mass is not imaged. Surgical clips in the gallbladder fossa. Musculoskeletal: Degenerative changes. No acute or suspicious bone lesion. IMPRESSION: 1. Increased consolidation in the left lower lobe with multifocal ground-glass density in the left upper and right lower lobes, suspicious for multifocal pneumonia. Previously described right lung nodularity and left lower lobe nodules have decreased. Aortic Atherosclerosis (ICD10-I70.0). Electronically Signed   By: Donavan Foil M.D.   On: 01/26/2017 23:22   Mr Brain Wo Contrast  Result Date: 01/25/2017 CLINICAL DATA:  Headache and weakness.  Left-sided facial droop. EXAM: MRI HEAD WITHOUT CONTRAST TECHNIQUE: Multiplanar, multiecho pulse sequences of the brain and surrounding structures were obtained without intravenous contrast. COMPARISON:  Head CT 01/25/2017 FINDINGS: Brain: The midline structures are normal. There is no focal diffusion restriction to indicate acute infarct. There is confluent hyperintense T2-weighted signal within the periventricular white matter, most often seen in the setting of chronic microvascular ischemia. No intraparenchymal hematoma or chronic microhemorrhage. No lobar predominant atrophy. The dura is normal and there is no extra-axial collection. Vascular:  Major intracranial arterial and venous sinus flow voids are preserved. Skull and upper cervical spine: The visualized skull base, calvarium, upper cervical spine and extracranial soft tissues are normal. Sinuses/Orbits: No fluid levels or advanced mucosal thickening. No mastoid or middle ear effusion. Normal orbits. IMPRESSION: 1. No acute abnormality. 2. Advanced chronic microvascular ischemia. Electronically Signed   By: Ulyses Jarred M.D.   On: 01/25/2017 21:13   Dg  Esophagus  Result Date: 01/27/2017 CLINICAL DATA:  Dysphagia EXAM: ESOPHOGRAM/BARIUM SWALLOW TECHNIQUE: Single contrast examination was performed using  thin barium. FLUOROSCOPY TIME:  Fluoroscopy Time:  1 minutes 18 seconds Radiation Exposure Index (if provided by the fluoroscopic device): 7.1 mGy COMPARISON:  None. FINDINGS: The esophagus is patent. No stricture or mass identified. There is a mild esophageal dysmotility with non propulsive tertiary waves noted. No hiatal hernia or reflux visualize. The patient ingested a barium tablet which easily passed through the esophagus and into the stomach. IMPRESSION: 1. Patent esophagus without evidence for stricture or mass. 2. No hiatal hernia or reflux. 3. Mild esophageal dysmotility. Electronically Signed   By: Kerby Moors M.D.   On: 01/27/2017 15:42   Mr Abdomen Mrcp Wo Contrast  Result Date: 01/27/2017 CLINICAL DATA:  Evaluate pancreatic cyst seen on prior CT abdomen. EXAM: MRI ABDOMEN WITHOUT CONTRAST  (INCLUDING MRCP) TECHNIQUE: Multiplanar multisequence MR imaging of the abdomen was performed. Heavily T2-weighted images of the biliary and pancreatic ducts were obtained, and three-dimensional MRCP images were rendered by post processing. COMPARISON:  CT scans 01/16/2017 and 09/25/2015 FINDINGS: Examination is limited by breathing motion artifact. Patient was unable hold her breath. Patient also refused contrast. Lower chest: Chronic basilar scarring changes. The heart is mildly enlarged. Hepatobiliary: No focal hepatic lesions or intrahepatic biliary dilatation. Normal caliber and course of the common bile duct. Low insertion of the cystic duct remnant. Status post cholecystectomy. 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 Lesion is exophytic. It appears well circumscribed and encapsulated. No obvious septations or nodularity. No surrounding inflammation. This has not significantly changed since the prior CT scan from April 2017.  It was also present on a remote CT scan from 2013 but was much smaller. It is most likely a benign lesion. I do not think followup but MRI is an option in this patient. I would recommend a followup pancreatic protocol CT scan in 1 year. Mild to moderate fatty change involving the pancreas. No ductal dilatation. No other lesions. Spleen:  Normal size.  No focal lesions. Adrenals/Urinary Tract: Simple appearing bilateral renal cysts. No worrisome renal lesions. The adrenal glands appear normal. Stomach/Bowel: The stomach, duodenum and visualized small bowel are grossly normal. Distended stool-filled colon is again noted and suggests chronic constipation. Vascular/Lymphatic: Advanced atherosclerotic calcifications involving the aorta and branch vessels without focal aneurysm. No mesenteric or retroperitoneal mass are adenopathy. Other:  No ascites or abdominal wall hernia. Musculoskeletal: No significant bony findings. IMPRESSION: 1. 2.9 x 2.7 cm cystic lesion associated with the body region of the pancreas slowly enlarging since 2013. This is likely benign and there are no worrisome MR imaging features although the examination is limited by lack of contrast which the patient refused. Recommend followup pancreatic protocol CT scan and 1 year. 2. Status post cholecystectomy.  Normal pancreaticobiliary tree. 3. Simple appearing bilateral renal cysts. Electronically Signed   By: Marijo Sanes M.D.   On: 01/27/2017 11:35   Mr 3d Recon At Scanner  Result Date: 01/27/2017 CLINICAL DATA:  Evaluate pancreatic  cyst seen on prior CT abdomen. EXAM: MRI ABDOMEN WITHOUT CONTRAST  (INCLUDING MRCP) TECHNIQUE: Multiplanar multisequence MR imaging of the abdomen was performed. Heavily T2-weighted images of the biliary and pancreatic ducts were obtained, and three-dimensional MRCP images were rendered by post processing. COMPARISON:  CT scans 01/16/2017 and 09/25/2015 FINDINGS: Examination is limited by breathing motion artifact.  Patient was unable hold her breath. Patient also refused contrast. Lower chest: Chronic basilar scarring changes. The heart is mildly enlarged. Hepatobiliary: No focal hepatic lesions or intrahepatic biliary dilatation. Normal caliber and course of the common bile duct. Low insertion of the cystic duct remnant. Status post cholecystectomy. 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 Lesion is exophytic. It appears well circumscribed and encapsulated. No obvious septations or nodularity. No surrounding inflammation. This has not significantly changed since the prior CT scan from April 2017. It was also present on a remote CT scan from 2013 but was much smaller. It is most likely a benign lesion. I do not think followup but MRI is an option in this patient. I would recommend a followup pancreatic protocol CT scan in 1 year. Mild to moderate fatty change involving the pancreas. No ductal dilatation. No other lesions. Spleen:  Normal size.  No focal lesions. Adrenals/Urinary Tract: Simple appearing bilateral renal cysts. No worrisome renal lesions. The adrenal glands appear normal. Stomach/Bowel: The stomach, duodenum and visualized small bowel are grossly normal. Distended stool-filled colon is again noted and suggests chronic constipation. Vascular/Lymphatic: Advanced atherosclerotic calcifications involving the aorta and branch vessels without focal aneurysm. No mesenteric or retroperitoneal mass are adenopathy. Other:  No ascites or abdominal wall hernia. Musculoskeletal: No significant bony findings. IMPRESSION: 1. 2.9 x 2.7 cm cystic lesion associated with the body region of the pancreas slowly enlarging since 2013. This is likely benign and there are no worrisome MR imaging features although the examination is limited by lack of contrast which the patient refused. Recommend followup pancreatic protocol CT scan and 1 year. 2. Status post cholecystectomy.  Normal pancreaticobiliary tree. 3.  Simple appearing bilateral renal cysts. Electronically Signed   By: Marijo Sanes M.D.   On: 01/27/2017 11:35    Microbiology: Recent Results (from the past 240 hour(s))  Culture, blood (Routine X 2) w Reflex to ID Panel     Status: None (Preliminary result)   Collection Time: 01/25/17  5:41 PM  Result Value Ref Range Status   Specimen Description BLOOD BLOOD RIGHT FOREARM  Final   Special Requests   Final    BOTTLES DRAWN AEROBIC AND ANAEROBIC Blood Culture adequate volume   Culture   Final    NO GROWTH 3 DAYS Performed at [facility], [place] [address] [place], [place] [address]    Report Status PENDING  Incomplete  Culture, blood (routine x 2) Call MD if unable to obtain prior to antibiotics being given     Status: None (Preliminary result)   Collection Time: 01/25/17  6:20 PM  Result Value Ref Range Status   Specimen Description BLOOD LEFT ANTECUBITAL  Final   Special Requests   Final    BOTTLES DRAWN AEROBIC AND ANAEROBIC Blood Culture adequate volume   Culture   Final    NO GROWTH 3 DAYS Performed at [facility], [place] [address], [place], [place] [address]    Report Status PENDING  Incomplete  Urine Culture     Status: None   Collection Time: 01/25/17  9:50 PM  Result Value  Ref Range Status   Specimen Description URINE, RANDOM  Final   Special Requests NONE  Final   Culture   Final    NO GROWTH Performed at [facility], [address], [place],  [address]    Report Status 01/27/2017 FINAL  Final     Labs: Basic Metabolic Panel:  Recent Labs Lab 01/25/17 1644 01/25/17 1657 01/25/17 1738 01/26/17 0530 01/27/17 0419  NA 128* 125*  --  130* 134*  K 5.0 4.9  --  4.1 4.2  CL 94* 91*  --  97* 101  CO2 27  --   --  24 26  GLUCOSE 175* 170*  --  80 94  BUN 33* 30*  --  28* 24*  CREATININE 1.57* 1.50*  --  1.34* 1.21*  CALCIUM 9.4  --   --  9.3 9.0  MG  --   --  1.6*  --   --    Liver Function Tests:  Recent Labs Lab 01/25/17 1644  01/26/17 0530  AST 14* 15  ALT 12* 13*  ALKPHOS 38 37*  BILITOT 0.3 0.4  PROT 6.2* 6.1*  ALBUMIN 3.3* 3.3*   CBC:  Recent Labs Lab 01/25/17 1644 01/25/17 1657 01/26/17 0530  WBC 21.4*  --  17.1*  NEUTROABS 19.5*  --   --   HGB 9.7* 9.5* 10.3*  HCT 28.4* 28.0* 29.6*  MCV 87.7  --  88.9  PLT 228  --  226   Cardiac Enzymes:  Recent Labs Lab 01/25/17 1738 01/25/17 2348 01/26/17 0530  TROPONINI <0.03 <0.03 <0.03    Recent Labs  07/14/16 1525 01/25/17 1644  BNP 159.5* 109.5*    CBG:  Recent Labs Lab 01/25/17 1540  GLUCAP 184*    Principal Problem:   Hyponatremia Active Problems:   Hypothyroidism   Dyslipidemia   Anemia in chronic renal disease   GASTROESOPHAGEAL REFLUX, NO ESOPHAGITIS   Polymyalgia rheumatica (HCC)   History of TIA - May 2015- Plavix   Leukocytosis   Coronary artery disease involving native coronary artery of native heart without angina pectoris   Essential hypertension   Pneumonia   Generalized weakness   TIA (transient ischemic attack)   Pancreatic mass   Aortic atherosclerosis (HCC)   Chronic constipation   Community acquired pneumonia   Palliative care encounter   Encounter for hospice care discussion   Time coordinating discharge: 35 minutes  Signed:  Murray Hodgkins, MD [facility] 01/28/2017, 3:27 PM

## 2017-01-28 NOTE — Progress Notes (Signed)
Hospice and [place] [facility]) Hospital Liaison:  RN  Visit   Notified by Purcell Mouton , CMRN, of patient/family request for [facility] services at home after discharge. Chart and patient information under review by [facility] physician. Hospice eligibility pending at this time.  Writer spoke with daughter by phone and patient at bedside to initiate education related to hospice philosophy, services and team approach to care.  Patient/family verbalized understanding of information given.  Per discussion, plan is for discharge to home by [place] on today.  Patient will need prescriptions for discharge comfort medications. DME needs have been discussed, patient currently has the following equipment in the home:  Hospital bed, walker shower chair and nebulizer.  Patient/family requests the following DME for delivery to the home 3 and 1.  [facility] equipment manager has been notified and will contact [facility] to arrange delivery to the home.  Home address has been verified and is correct in the chart.   Shaquisha Wynn is the family member to contact to arrange time of delivery.  [facility] aware of the above.  Completed discharge summary will need to be faxed to [facility] at [phone] when final.  Please notify [facility] when patient is ready to leave the unit at discharge. (Call [phone] or [phone] after 5pm.)  [facility] information and contact numbers given to patient during this visit.   Above information shared with Purcell Mouton, CMRN.  Please call with any hospice related questions.  Thank you for this referral.   Farrel Gordon, RN , [facility] Liaison [phone] liaisons are now on [place].

## 2017-01-28 NOTE — Consult Note (Signed)
Consultation Note Date: 01/28/2017   Patient Name: Rebecca [name]  DOB: 1941/06/07  MRN: [id number]  Age / Sex: 76 y.o., female  PCP: [name], MD Referring Physician: Samuella Cota, MD  Reason for Consultation: Hospice Evaluation and Psychosocial/spiritual support  HPI/Patient Profile: 76 y.o. female  with past medical history of CAD, CKD stg 4, COPD, MGUS, PMR, Schizoaffective DO, recurrent aspiration pneumonia and stroke who was admitted on 01/25/2017 with weakness, headache, and fatigue.  Work up and treatment in the hospital has been focused on multifocal pneumonia, hyponatremia, TIA, and pancreatic mass.  PMT was consulted via Oncology service.  Clinical Assessment and Goals of Care:  I have reviewed medical records including EPIC notes, labs and imaging, received report from the Hospitalist attending physician, and then spoke with the patient's daughter, Allysa Governale (primary care taker)  to discuss diagnosis prognosis, GOC, disposition and options.  I introduced Palliative Medicine as specialized medical care for people living with serious illness. It focuses on providing relief from the symptoms and stress of a serious illness. The goal is to improve quality of life for both the patient and the family.  I explained to [name] that [facility] outpatient does not offer hands on care at home, rather Hospice Services offer hands on care at home.  [name] expressed that she did not want to "call in Hospice too early."  We discussed that patient's goals of care.  Kabella needs more help in the home.  [name] and Bethune live together and [name] is her mothers 24 hour care giver.  [name] is concerned about having quick access to medical care (a nurse) if her mother is in distress in the middle of the night.  She is concerned about wound care in the home.  She is concerned about managing medications to treat her mother's  pain satisfactorily.   I explained that these are things Hospice does very well.  I asked about QUALITY of life vs QUANTITY of life.  [name] states that she and her mother have had that conversation.  Her mother states that she wants QUALITY.  Specifically she states that she wants to be out of pain and distress even if it shortens her life.    [name] stated that her mother becomes septic very quickly and she wants to be able to get her the medical care she needs.  I advised that if Hospice is unable to care for her mother at home and keep her comfortable - she would certainly be welcome to bring her back to the hospital.  Further, oral antibiotics are in line with Hospice guidelines.     With regard to symptom management [name] was concerned about her mother's chronic constipation and her pain that is not well managed by tramadol.  We discussed adding 2 senna tablets to her daily bowel regimen as well as an occasional suppository.  Hospice and Palliative Care services outpatient were explained and offered.  [name] requested Hospice and Palliative care of [place].  Code status was not addressed on  this phone call and will need to be addressed by Hospice.  [name] was encouraged to call with questions or concerns.    Primary Decision Maker:  NEXT OF KIN Amanda Petras    SUMMARY OF RECOMMENDATIONS     Constipation:  Add senna 2 tabs qhs in addition to miralax.  Suppository PRN.  Will request 1 prior to d/c today.  Pain:  Will d/c tramadol and initiate sublingual morphine concentrate PRN  Home Health Services:  Engage [facility] on discharge today  - include wound care RN services in the Hospice bundle.  Family requests antibiotics when necessary.   Code Status/Advance Care Planning:  Full code.  Will need to be addressed in person by Hospice if PMT (inpatient) is unable to round prior to d/c   Additional Recommendations (Limitations, Scope, Preferences):  Minimize  Medications    Prognosis:   Less than 6 months in the setting of CAD, CKD stg 4, COPD, MGUS, PMR, Schizoaffective DO, recurrent aspiration pneumonia and stroke as well as abdominal mass of uncertain etiology for which the patient is too weak to undergo biopsy or treatment.  Discharge Planning: Home with Hospice      Primary Diagnoses: Present on Admission: . Hypothyroidism . Dyslipidemia . Anemia in chronic renal disease . GASTROESOPHAGEAL REFLUX, NO ESOPHAGITIS . (Resolved) COPD exacerbation ([place]) . Polymyalgia rheumatica ([place]) . Coronary artery disease involving native coronary artery of native heart without angina pectoris . Essential hypertension . Pneumonia . Leukocytosis   I have reviewed the medical record, interviewed the patient and family, and examined the patient. The following aspects are pertinent.  Past Medical History:  Diagnosis Date  . AKI (acute kidney injury) ([place])   . Anemia in chronic renal disease   . Aortic atherosclerosis ([place]) 01/27/2017  . CAD (coronary artery disease) 2006   a. s/p DES to RCA in 2006 b. low-risk NST in 2014 c. cath in 08/2015 showing patent RCA stent with nonobstructive disease  . CKD (chronic kidney disease) stage 4, GFR 15-29 ml/min (HCC)   . Complication of anesthesia   . COPD (chronic obstructive pulmonary disease) ([place])   . Diabetes mellitus   . Diastolic heart failure, NYHA class 1 ([place])   . Dyslipidemia   . Dysuria 01/06/2015  . Frozen shoulder    right  . Gastroesophageal reflux   . HAP (hospital-acquired pneumonia)   . Headache   . Hyperkalemia 01/01/2016  . Hypothyroidism   . 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 Obesity   . Pancreatic mass 01/27/2017  . Pneumonia 06/2016  . Polymyalgia rheumatica ([place])   . PONV (postoperative nausea and vomiting)   . Renal insufficiency   . Respiratory distress   . Schizoaffective disorder ([place])   . Sepsis due to pneumonia ([place])   . Shortness of  breath   . Stroke [facility])    Social History   Social History  . Marital status: Divorced    Spouse name: N/A  . Number of children: 3  . Years of education: 9th   Occupational History  . homemaker    Social History Main Topics  . Smoking status: Former Smoker    Packs/day: 1.00    Years: 35.00    Quit date: 04/14/2004  . Smokeless tobacco: Never Used  . Alcohol use No  . Drug use: No  . Sexual activity: No   Other Topics Concern  . None   Social History Narrative   Daughter [name] and son are very involved.  [name] comes to most office visits.   She now lives at home with daughter [name]   Daughter is HCPOA, not averse to SNF/rehab in short-term, but does not wish pt to return to [facility] and requests that pt/pt's care not be discussed by [facility] providers to [facility], moving forward.   Caffeine Use: 1-3 cups daily   Family History  Problem Relation Age of Onset  . Diabetes Mother   . Heart disease Mother   . Heart disease Father   . Arthritis Father   . Healthy Sister   . Heart disease Brother   . Cancer Brother        colon ca  . Osteoporosis Sister   . Heart disease Brother   . Diabetes Brother   . Heart disease Brother   . Diabetes Brother   . Diabetes Brother   . Healthy Brother   . Healthy Brother   . Healthy Brother   . Cancer Maternal Uncle        blood condition  . Cancer Daughter        breast ca  . Cancer Cousin        NHL   Scheduled Meds: . bisacodyl  10 mg Rectal Daily  . clopidogrel  75 mg Oral QHS  . cloZAPine  300 mg Oral QHS  . diltiazem  180 mg Oral Daily  . feeding supplement (ENSURE ENLIVE)  237 mL Oral Q24H  . isosorbide mononitrate  60 mg Oral Daily  . levothyroxine  75 mcg Oral QAC breakfast  . metoprolol succinate  100 mg Oral Q breakfast  . mometasone-formoterol  2 puff Inhalation BID  . pantoprazole  40 mg Oral Daily  . polyethylene glycol  17 g Oral Daily  . pravastatin  20 mg Oral QHS  . predniSONE  20  mg Oral Q breakfast  . senna  2 tablet Oral QHS  . tiotropium  1 capsule Inhalation q morning - 10a   Continuous Infusions: . sodium chloride 10 mL/hr at 01/26/17 1446  . azithromycin Stopped (01/28/17 4098)  . cefTRIAXone (ROCEPHIN)  IV Stopped (01/28/17 0543)   PRN Meds:.acetaminophen **OR** acetaminophen, gadobenate dimeglumine, levalbuterol, morphine CONCENTRATE, ondansetron **OR** ondansetron (ZOFRAN) IV Allergies  Allergen Reactions  . Levaquin [Levofloxacin] Other (See Comments)    Ruptured tendon  . Tape Other (See Comments)    PATIENT'S SKIN IS VERY, VERY THIN AND WILL TEAR AND BRUISE EASILY; PLEASE USE AN ALTERNATIVE (SOMETHING OTHER THAN TAPE)   Review of Systems   Physical Exam    Vital Signs: BP (!) 141/79   Pulse 70   Temp 97.9 F (36.6 C) (Oral)   Resp 15   Ht 5\' 3"  (1.6 m)   Wt 63 kg (139 lb)   SpO2 97%   BMI 24.62 kg/m  Pain Assessment: 0-10 POSS *See Group Information*: 1-Acceptable,Awake and alert Pain Score: 3    SpO2: SpO2: 97 % O2 Device:SpO2: 97 % O2 Flow Rate: .   IO: Intake/output summary:  Intake/Output Summary (Last 24 hours) at 01/28/17 0938 Last data filed at 01/28/17 0901  Gross per 24 hour  Intake             1760 ml  Output              450 ml  Net             1310 ml    LBM: Last BM Date: 01/26/17 Baseline Weight: Weight: 63 kg (139 lb)  Most recent weight: Weight: 63 kg (139 lb)     Palliative Assessment/Data:     Time In: 9:00 Time Out: 10:00  Time Total: 60 min. Greater than 50%  of this time was spent counseling and coordinating care related to the above assessment and plan.  Signed by: Florentina Jenny, PA-C Palliative Medicine Pager: [phone]  Please contact Palliative Medicine Team phone at [phone] for questions and concerns.  For individual provider: See Shea Evans

## 2017-01-30 LAB — CULTURE, BLOOD (ROUTINE X 2)
CULTURE: NO GROWTH
Culture: NO GROWTH
SPECIAL REQUESTS: ADEQUATE
SPECIAL REQUESTS: ADEQUATE

## 2017-02-01 DIAGNOSIS — M25559 Pain in unspecified hip: Secondary | ICD-10-CM | POA: Diagnosis not present

## 2017-02-01 DIAGNOSIS — Z7952 Long term (current) use of systemic steroids: Secondary | ICD-10-CM | POA: Diagnosis not present

## 2017-02-01 DIAGNOSIS — M810 Age-related osteoporosis without current pathological fracture: Secondary | ICD-10-CM | POA: Diagnosis not present

## 2017-02-01 DIAGNOSIS — M353 Polymyalgia rheumatica: Secondary | ICD-10-CM | POA: Diagnosis not present

## 2017-02-01 DIAGNOSIS — M7989 Other specified soft tissue disorders: Secondary | ICD-10-CM | POA: Diagnosis not present

## 2017-02-01 DIAGNOSIS — J690 Pneumonitis due to inhalation of food and vomit: Secondary | ICD-10-CM | POA: Diagnosis not present

## 2017-02-01 DIAGNOSIS — N183 Chronic kidney disease, stage 3 (moderate): Secondary | ICD-10-CM | POA: Diagnosis not present

## 2017-02-01 DIAGNOSIS — D472 Monoclonal gammopathy: Secondary | ICD-10-CM | POA: Diagnosis not present

## 2017-02-01 DIAGNOSIS — Z6824 Body mass index (BMI) 24.0-24.9, adult: Secondary | ICD-10-CM | POA: Diagnosis not present

## 2017-02-01 DIAGNOSIS — M150 Primary generalized (osteo)arthritis: Secondary | ICD-10-CM | POA: Diagnosis not present

## 2017-02-02 DIAGNOSIS — R300 Dysuria: Secondary | ICD-10-CM | POA: Diagnosis not present

## 2017-02-04 DIAGNOSIS — N3020 Other chronic cystitis without hematuria: Secondary | ICD-10-CM | POA: Diagnosis not present

## 2017-02-04 DIAGNOSIS — Z79899 Other long term (current) drug therapy: Secondary | ICD-10-CM | POA: Diagnosis not present

## 2017-02-10 ENCOUNTER — Encounter (HOSPITAL_COMMUNITY): Payer: Self-pay

## 2017-02-10 ENCOUNTER — Ambulatory Visit (HOSPITAL_COMMUNITY): Admit: 2017-02-10 | Payer: Medicare Other | Admitting: Gastroenterology

## 2017-02-10 DIAGNOSIS — M810 Age-related osteoporosis without current pathological fracture: Secondary | ICD-10-CM | POA: Diagnosis not present

## 2017-02-10 SURGERY — UPPER ENDOSCOPIC ULTRASOUND (EUS) LINEAR
Anesthesia: Monitor Anesthesia Care

## 2017-02-11 DIAGNOSIS — J690 Pneumonitis due to inhalation of food and vomit: Secondary | ICD-10-CM | POA: Diagnosis not present

## 2017-02-11 DIAGNOSIS — K869 Disease of pancreas, unspecified: Secondary | ICD-10-CM | POA: Diagnosis not present

## 2017-02-11 DIAGNOSIS — Z6824 Body mass index (BMI) 24.0-24.9, adult: Secondary | ICD-10-CM | POA: Diagnosis not present

## 2017-02-11 DIAGNOSIS — J449 Chronic obstructive pulmonary disease, unspecified: Secondary | ICD-10-CM | POA: Diagnosis not present

## 2017-02-11 DIAGNOSIS — R062 Wheezing: Secondary | ICD-10-CM | POA: Diagnosis not present

## 2017-02-11 DIAGNOSIS — N184 Chronic kidney disease, stage 4 (severe): Secondary | ICD-10-CM | POA: Diagnosis not present

## 2017-02-11 DIAGNOSIS — G458 Other transient cerebral ischemic attacks and related syndromes: Secondary | ICD-10-CM | POA: Diagnosis not present

## 2017-02-11 DIAGNOSIS — I509 Heart failure, unspecified: Secondary | ICD-10-CM | POA: Diagnosis not present

## 2017-02-11 DIAGNOSIS — D72829 Elevated white blood cell count, unspecified: Secondary | ICD-10-CM | POA: Diagnosis not present

## 2017-02-17 DIAGNOSIS — D472 Monoclonal gammopathy: Secondary | ICD-10-CM | POA: Diagnosis not present

## 2017-02-17 DIAGNOSIS — M25559 Pain in unspecified hip: Secondary | ICD-10-CM | POA: Diagnosis not present

## 2017-02-17 DIAGNOSIS — Z6824 Body mass index (BMI) 24.0-24.9, adult: Secondary | ICD-10-CM | POA: Diagnosis not present

## 2017-02-17 DIAGNOSIS — J690 Pneumonitis due to inhalation of food and vomit: Secondary | ICD-10-CM | POA: Diagnosis not present

## 2017-02-17 DIAGNOSIS — Z7952 Long term (current) use of systemic steroids: Secondary | ICD-10-CM | POA: Diagnosis not present

## 2017-02-17 DIAGNOSIS — M25551 Pain in right hip: Secondary | ICD-10-CM | POA: Diagnosis not present

## 2017-02-17 DIAGNOSIS — M7061 Trochanteric bursitis, right hip: Secondary | ICD-10-CM | POA: Diagnosis not present

## 2017-02-17 DIAGNOSIS — M7989 Other specified soft tissue disorders: Secondary | ICD-10-CM | POA: Diagnosis not present

## 2017-02-17 DIAGNOSIS — N183 Chronic kidney disease, stage 3 (moderate): Secondary | ICD-10-CM | POA: Diagnosis not present

## 2017-02-17 DIAGNOSIS — M353 Polymyalgia rheumatica: Secondary | ICD-10-CM | POA: Diagnosis not present

## 2017-02-17 DIAGNOSIS — M810 Age-related osteoporosis without current pathological fracture: Secondary | ICD-10-CM | POA: Diagnosis not present

## 2017-02-17 DIAGNOSIS — M150 Primary generalized (osteo)arthritis: Secondary | ICD-10-CM | POA: Diagnosis not present

## 2017-02-20 ENCOUNTER — Other Ambulatory Visit: Payer: Self-pay | Admitting: Adult Health

## 2017-02-20 DIAGNOSIS — D472 Monoclonal gammopathy: Secondary | ICD-10-CM

## 2017-02-25 ENCOUNTER — Encounter: Payer: Self-pay | Admitting: Student

## 2017-02-25 ENCOUNTER — Ambulatory Visit (INDEPENDENT_AMBULATORY_CARE_PROVIDER_SITE_OTHER): Admitting: Student

## 2017-02-25 VITALS — BP 122/66 | HR 71 | Ht 63.0 in | Wt 135.2 lb

## 2017-02-25 DIAGNOSIS — E039 Hypothyroidism, unspecified: Secondary | ICD-10-CM

## 2017-02-25 DIAGNOSIS — R0600 Dyspnea, unspecified: Secondary | ICD-10-CM | POA: Diagnosis not present

## 2017-02-25 DIAGNOSIS — Z79899 Other long term (current) drug therapy: Secondary | ICD-10-CM

## 2017-02-25 DIAGNOSIS — I10 Essential (primary) hypertension: Secondary | ICD-10-CM | POA: Diagnosis not present

## 2017-02-25 DIAGNOSIS — N183 Chronic kidney disease, stage 3 unspecified: Secondary | ICD-10-CM

## 2017-02-25 DIAGNOSIS — I5032 Chronic diastolic (congestive) heart failure: Secondary | ICD-10-CM | POA: Diagnosis not present

## 2017-02-25 DIAGNOSIS — I2510 Atherosclerotic heart disease of native coronary artery without angina pectoris: Secondary | ICD-10-CM

## 2017-02-25 NOTE — Patient Instructions (Signed)
Medication Instructions: Your physician recommends that you continue on your current medications as directed. Please refer to the Current Medication list given to you today.  If you need a refill on your cardiac medications before your next appointment, please call your pharmacy.   Labwork: TODAY (TSH, CBC, T3, T4)  Procedures/Testing: NONE  Follow-Up: Your physician recommends that you schedule a follow-up appointment in: 3 MONTHS with Dr. Debara Pickett.    Special Instructions:    Thank you for choosing Heartcare at [facility]!!

## 2017-02-25 NOTE — Progress Notes (Signed)
Cardiology Office Note    Date:  02/25/2017   ID:  Rebecca [name], DOB 1941/03/10, MRN [id number]  PCP:  [name], MD  Cardiologist: Dr. Debara Pickett   Chief Complaint  Patient presents with  . Follow-up    Weakness and Dyspnea    History of Present Illness:    Rebecca [name] is a 76 y.o. 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   She was last examined by myself on 01/11/2017 and reported left-sided chest pain which was lasting for seconds at a time, thought to be atypical for a cardiac etiology. She has a known chronic anemia which is followed by Hematology. Hgb was rechecked and stable at 11.1.  In the interium, she was admitted to [facility] from 8/14 - 01/28/2017 for evaluation of headache and weakness. There was initially concern for a TIA as facial droop was noted and CT Head and MRI showed no acute intracranial abnormalities. She was found to be hyponatremic with a Na+ of 128. CXR was concerning for PNA and she was appropriately treated with antibiotics. Palliative Care was consulted for goals of care and she was discharged with [facility].   In talking with the patient today, she reports experiencing generalized weakness. Most history is provided by her daughter who reports she has been experiencing worsening dyspnea and a productive cough for the past 2 weeks. She has undergone outpatient antibiotic therapy by her PCP per her daughter is concerned that this is a recurrent issue and requests a Pulmonology referral.   She denies any recent chest pain, palpitations, orthopnea, PND, or lower extremity edema. Home Hospice is coming to her home 1-2 times per week  and assisting in her care.   Past Medical History:  Diagnosis Date  . AKI (acute kidney injury) ([place])   . Anemia in chronic renal disease   . Aortic atherosclerosis ([place]) 01/27/2017  . CAD (coronary artery disease) 2006   a. s/p DES to RCA in 2006 b. low-risk NST in 2014 c. cath in 08/2015 showing patent RCA stent with nonobstructive disease  . CKD (chronic kidney disease) stage 4, GFR 15-29 ml/min ([facility])   . Complication of anesthesia   . COPD (chronic obstructive pulmonary disease) ([place])   . Diabetes mellitus   . Diastolic heart failure, NYHA class 1 ([place])   . Dyslipidemia   . Dysuria 01/06/2015  . Frozen shoulder    right  . Gastroesophageal reflux   . HAP (hospital-acquired pneumonia)   . Headache   . Hyperkalemia 01/01/2016  . Hypothyroidism   . 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 Obesity   . Pancreatic mass 01/27/2017  . Pneumonia 06/2016  . 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 PONV (postoperative nausea and vomiting)   . Renal insufficiency   . Respiratory distress   . Schizoaffective disorder ([place])   . Sepsis due to pneumonia ([place])   . Shortness of breath   . Stroke [facility])     Past Surgical History:  Procedure Laterality Date  . ABDOMINAL HYSTERECTOMY    . CARDIAC CATHETERIZATION N/A 08/13/2015   Procedure: Left Heart Cath and Coronary Angiography;  Surgeon: Charlann Lange  Irish Lack, MD;  Location: [place] [facility];  Service: Cardiovascular;  Laterality: N/A;  . CATARACT EXTRACTION    . CHOLECYSTECTOMY    . CORONARY ANGIOPLASTY WITH STENT PLACEMENT  2006   [place] DES to RCA, 50 % residual  . PARTIAL HYSTERECTOMY  1995    Current Medications: Outpatient Medications Prior to Visit  Medication Sig Dispense Refill  . acetaminophen (TYLENOL) 500 MG tablet Take 1 tablet (500 mg total) by mouth every 6 (six) hours as needed (pain). (Patient taking differently: Take 1,000 mg by mouth 2 (two) times daily as needed for moderate pain. ) 30 tablet 0  .  cetirizine (ZYRTEC) 10 MG tablet Take 10 mg by mouth at bedtime.     . clopidogrel (PLAVIX) 75 MG tablet Take 1 tablet (75 mg total) by mouth daily with breakfast. (Patient taking differently: Take 75 mg by mouth at bedtime. ) 30 tablet 0  . cloZAPine (CLOZARIL) 100 MG tablet Take 300 mg by mouth at bedtime.  5  . Cranberry 250 MG TABS Take 1 tablet by mouth at bedtime.    Marland Kitchen denosumab (PROLIA) 60 MG/ML SOLN injection Inject 60 mg into the skin every 6 (six) months. Administer in upper arm, thigh, or abdomen    . diltiazem (CARDIZEM LA) 180 MG 24 hr tablet Take 180 mg by mouth daily with breakfast.  0  . docusate sodium (COLACE) 100 MG capsule Take 200 mg by mouth at bedtime.  10 capsule 0  . Fluticasone-Salmeterol (ADVAIR) 250-50 MCG/DOSE AEPB Inhale 1 puff into the lungs at bedtime.    . furosemide (LASIX) 20 MG tablet Take 1 tablet (20 mg total) by mouth daily as needed. (Patient taking differently: Take 20 mg by mouth daily as needed for fluid or edema. ) 30 tablet 3  . GUAIFENESIN PO Take 400 mg by mouth 2 (two) times daily.     Marland Kitchen ipratropium-albuterol (DUONEB) 0.5-2.5 (3) MG/3ML SOLN Inhale 3 mLs into the lungs 3 (three) times daily.   3  . isosorbide mononitrate (IMDUR) 60 MG 24 hr tablet Take 1 tablet (60 mg total) by mouth daily. 30 tablet 6  . levothyroxine (SYNTHROID, LEVOTHROID) 75 MCG tablet Take 75 mcg by mouth daily before breakfast.    . metoprolol succinate (TOPROL-XL) 100 MG 24 hr tablet Take 100 mg by mouth daily with breakfast. Take with or immediately following a meal.    . Multiple Vitamins-Minerals (MULTIVITAMIN WITH MINERALS) tablet Take 1 tablet by mouth at bedtime.     . nitrofurantoin (MACRODANTIN) 100 MG capsule Take 100 mg by mouth at bedtime. Filled 01/11/17 x 30 days  6  . nitroGLYCERIN (NITROSTAT) 0.4 MG SL tablet Place 1 tablet (0.4 mg total) under the tongue every 5 (five) minutes as needed. For chest pain 25 tablet 3  . ondansetron (ZOFRAN) 4 MG tablet Take 1 tablet  (4 mg total) by mouth every 8 (eight) hours as needed for nausea or vomiting. 18 tablet 0  . pantoprazole (PROTONIX) 40 MG tablet Take 1 tablet (40 mg total) by mouth daily. 30 tablet 11  . polyethylene glycol powder (GLYCOLAX/MIRALAX) powder Take 17 g by mouth 2 (two) times daily. [place]    . pravastatin (PRAVACHOL) 20 MG tablet Take 20 mg by mouth at bedtime.    . predniSONE (DELTASONE) 10 MG tablet Take 1 tablet (10 mg total) by mouth daily with breakfast. 30 tablet 0  . Probiotic Product (PROBIOTIC PO) Take 1 capsule by mouth daily.    Marland Kitchen  senna (SENOKOT) 8.6 MG TABS tablet Take 2 tablets (17.2 mg total) by mouth at bedtime.    . Tiotropium Bromide Monohydrate (SPIRIVA RESPIMAT) 2.5 MCG/ACT AERS Inhale 2 puffs into the lungs every morning.     . traMADol (ULTRAM) 50 MG tablet Take 100 mg by mouth 2 (two) times daily as needed for pain.  3  . Vitamin D, Ergocalciferol, (DRISDOL) 50000 UNITS CAPS capsule Take 1 capsule (50,000 Units total) by mouth every 7 (seven) days. 4 capsule 0  . azithromycin (ZITHROMAX) 500 MG tablet Take 1 tablet (500 mg total) by mouth daily. Take 8/18 in the morning 1 tablet 0   No facility-administered medications prior to visit.      Allergies:   Levaquin [levofloxacin] and Tape   Social History   Social History  . Marital status: Divorced    Spouse name: N/A  . Number of children: 3  . Years of education: 9th   Occupational History  . homemaker    Social History Main Topics  . Smoking status: Former Smoker    Packs/day: 1.00    Years: 35.00    Quit date: 04/14/2004  . Smokeless tobacco: Never Used  . Alcohol use No  . Drug use: No  . Sexual activity: No   Other Topics Concern  . None   Social History Narrative   Daughter [name] and son are very involved.     [name] comes to most office visits.   She now lives at home with daughter [name]   Daughter is HCPOA, not averse to SNF/rehab in short-term, but does not wish pt to return to  [facility] and requests that pt/pt's care not be discussed by [facility] providers to [facility], moving forward.   Caffeine Use: 1-3 cups daily     Family History:  The patient's family history includes Arthritis in her father; Cancer in her brother, cousin, daughter, and maternal uncle; Diabetes in her brother, brother, brother, and mother; Healthy in her brother, brother, brother, and sister; Heart disease in her brother, brother, brother, father, and mother; Osteoporosis in her sister.   Review of Systems:   Please see the history of present illness.     General:  No chills, fever, night sweats or weight changes.  Cardiovascular:  No chest pain, dyspnea on exertion, edema, orthopnea, palpitations, paroxysmal nocturnal dyspnea. Dermatological: No rash, lesions/masses Respiratory: Positive for cough and dyspnea Urologic: No hematuria, dysuria Abdominal:   No nausea, vomiting, diarrhea, bright red blood per rectum, melena, or hematemesis Neurologic:  No visual changes, changes in mental status. Positive for weakness.  All other systems reviewed and are otherwise negative except as noted above.   Physical Exam:    VS:  BP 122/66   Pulse 71   Ht 5\' 3"  (1.6 m)   Wt 135 lb 3.2 oz (61.3 kg)   BMI 23.95 kg/m    General: Well developed, elderly Caucasian female appearing in no acute distress. Head: Normocephalic, atraumatic, sclera non-icteric, no xanthomas, nares are without discharge.  Neck: No carotid bruits. JVD not elevated.  Lungs: Respirations regular and unlabored, mild expiratory wheezing along upper lung fields bilaterally Heart: Regular rate and rhythm. No S3 or S4.  No murmur, no rubs, or gallops appreciated. Abdomen: Soft, non-tender, non-distended with normoactive bowel sounds. No hepatomegaly. No rebound/guarding. No obvious abdominal masses. Msk:  Strength and tone appear normal for age. No joint deformities or effusions. Extremities: No clubbing or cyanosis. No  lower extremity edema.  Distal pedal  pulses are 2+ bilaterally. Neuro: Alert and oriented X 3. Moves all extremities spontaneously. No focal deficits noted. Psych:  Responds to questions appropriately with a normal affect. Skin: No rashes or lesions noted  Wt Readings from Last 3 Encounters:  02/25/17 135 lb 3.2 oz (61.3 kg)  01/28/17 139 lb (63 kg)  01/11/17 139 lb 9.6 oz (63.3 kg)    Studies/Labs Reviewed:   EKG:  EKG is not ordered today.   Recent Labs: 01/25/2017: B Natriuretic Peptide 109.5; Magnesium 1.6; TSH 1.173 01/26/2017: ALT 13; Hemoglobin 10.3; Platelets 226 01/27/2017: BUN 24; Creatinine, Ser 1.21; Potassium 4.2; Sodium 134   Lipid Panel    Component Value Date/Time   CHOL 135 01/28/2017 0403   [name] 72 01/28/2017 0403   [name] 64 01/28/2017 0403   [name] 2.1 01/28/2017 0403   VLDL 14 01/28/2017 0403   [name] 57 01/28/2017 0403   LDLDIRECT 88 09/05/2007 2043    Additional studies/ records that were reviewed today include:   Echocardiogram: 01/26/2017  Study Conclusions - Left ventricle: The cavity size was normal. Wall thickness was   increased in a pattern of moderate LVH. Systolic function was   normal. The estimated ejection fraction was in the range of 55%   to 60%. Wall motion was normal; there were no regional wall   motion abnormalities. Doppler parameters are consistent with   abnormal left ventricular relaxation (grade 1 diastolic   dysfunction). - Aortic valve: There was no stenosis. - Mitral valve: Mildly calcified annulus. There was trivial   regurgitation. - Right ventricle: The cavity size was normal. Systolic function   was normal. - Pulmonary arteries: PA peak pressure: 24 mm Hg (S). - Systemic veins: IVC measured 2.0 cm with < 50% respirophasic   variation, suggesting RA pressure 8 mmHg.  Impressions:  - Normal LV size with moderate LV hypertrophy, EF 55-60%. Normal RV   size and systolic function. No significant valvular    abnormalities.   Cardiac Catheterization: 08/2015  Patent RCA stent.  Mild, nonobstructive disease in the coronary arteries.  Mildly elevated LVEDP.   Continue hydration due to her renal insufficiency.  Continue aggressive secondary prevention.   Assessment:    1. Coronary artery disease involving native coronary artery of native heart without angina pectoris   2. Chronic diastolic (congestive) heart failure ([place])   3. Essential hypertension   4. CKD (chronic kidney disease) stage 3, GFR 30-59 ml/min   5. Acute dyspnea   6. Hypothyroidism, unspecified type   7. Medication management      Plan:   In order of problems listed above:  1. CAD - s/p DES to RCA in 2006 with recent cath in 08/2015 showing patent RCA stent with nonobstructive disease. - she denies any recent chest pain or dyspnea on exertion. - continue Plavix, BB, Imdur, and statin therapy.  2. Chronic Diastolic CHF - echo in 46/5681 showed a preserved EF of 55-60% with Grade 1 DD.  - she denies any recent orthopnea, PND, or lower extremity edema. Does have recurrent episodes of acute dyspnea.  - continue PRN Lasix.   3. HTN - BP is well-controlled at 122/66 during today's visit.  - continue current medication regimen.   4. Stage 3 CKD - creatinine stable at 1.21 when last checked on 01/27/2017.  5. Dyspnea/ Recurrent PNA - has been admitted multiple times for PNA and has been on numerous antibiotics by her PCP.  - she does not appear volume overloaded by physical examination.  Daughter is concerned for recurrent aspiration PNA (continues to thicken her liquids). Will place Ambulatory referral for Pulmonology.   6. Hypothyroidism - remains on Synthroid 75 mcg daily. Dosing recently adjusted by her PCP.  - will recheck Thyroid Panel today (patient does not have follow-up with her PCP for 2 months).    Medication Adjustments/Labs and Tests Ordered: Current medicines are reviewed at length with the  patient today.  Concerns regarding medicines are outlined above.  Medication changes, Labs and Tests ordered today are listed in the Patient Instructions below. Patient Instructions  Medication Instructions: Your physician recommends that you continue on your current medications as directed. Please refer to the Current Medication list given to you today.  If you need a refill on your cardiac medications before your next appointment, please call your pharmacy.   Labwork: TODAY (TSH, CBC, T3, T4)  Procedures/Testing: NONE  Follow-Up: Your physician recommends that you schedule a follow-up appointment in: 3 MONTHS with Dr. Debara Pickett.   Special Instructions:  Thank you for choosing [facility]care at [facility]!!      Signed, Erma Heritage, PA-C  02/25/2017 9:59 PM    [facility] [address], [place], [place]  [address] Phone: [phone]; Fax: [phone]  [address] [place], [place] [address] Phone: [phone]

## 2017-02-26 DIAGNOSIS — J188 Other pneumonia, unspecified organism: Secondary | ICD-10-CM | POA: Diagnosis not present

## 2017-02-26 DIAGNOSIS — I25118 Atherosclerotic heart disease of native coronary artery with other forms of angina pectoris: Secondary | ICD-10-CM | POA: Diagnosis not present

## 2017-02-26 DIAGNOSIS — I2510 Atherosclerotic heart disease of native coronary artery without angina pectoris: Secondary | ICD-10-CM | POA: Diagnosis not present

## 2017-02-26 DIAGNOSIS — G458 Other transient cerebral ischemic attacks and related syndromes: Secondary | ICD-10-CM | POA: Diagnosis not present

## 2017-02-26 DIAGNOSIS — Z6824 Body mass index (BMI) 24.0-24.9, adult: Secondary | ICD-10-CM | POA: Diagnosis not present

## 2017-02-26 DIAGNOSIS — D72829 Elevated white blood cell count, unspecified: Secondary | ICD-10-CM | POA: Diagnosis not present

## 2017-02-26 DIAGNOSIS — J449 Chronic obstructive pulmonary disease, unspecified: Secondary | ICD-10-CM | POA: Diagnosis not present

## 2017-02-26 DIAGNOSIS — E871 Hypo-osmolality and hyponatremia: Secondary | ICD-10-CM | POA: Diagnosis not present

## 2017-02-26 DIAGNOSIS — N184 Chronic kidney disease, stage 4 (severe): Secondary | ICD-10-CM | POA: Diagnosis not present

## 2017-02-26 DIAGNOSIS — K869 Disease of pancreas, unspecified: Secondary | ICD-10-CM | POA: Diagnosis not present

## 2017-02-26 DIAGNOSIS — D638 Anemia in other chronic diseases classified elsewhere: Secondary | ICD-10-CM | POA: Diagnosis not present

## 2017-02-26 DIAGNOSIS — R300 Dysuria: Secondary | ICD-10-CM | POA: Diagnosis not present

## 2017-02-26 LAB — CBC
HEMATOCRIT: 29.9 % — AB (ref 34.0–46.6)
Hemoglobin: 10 g/dL — ABNORMAL LOW (ref 11.1–15.9)
MCH: 30.3 pg (ref 26.6–33.0)
MCHC: 33.4 g/dL (ref 31.5–35.7)
MCV: 91 fL (ref 79–97)
PLATELETS: 234 10*3/uL (ref 150–379)
RBC: 3.3 x10E6/uL — ABNORMAL LOW (ref 3.77–5.28)
RDW: 13.5 % (ref 12.3–15.4)
WBC: 18.6 10*3/uL — AB (ref 3.4–10.8)

## 2017-02-26 LAB — T3, FREE: T3 FREE: 0.9 pg/mL — AB (ref 2.0–4.4)

## 2017-02-26 LAB — TSH: TSH: 0.536 u[IU]/mL (ref 0.450–4.500)

## 2017-02-26 LAB — T4, FREE: FREE T4: 1.04 ng/dL (ref 0.82–1.77)

## 2017-03-02 ENCOUNTER — Telehealth: Payer: Self-pay | Admitting: Student

## 2017-03-02 NOTE — Telephone Encounter (Signed)
New message       Pt was seen recently by Rebecca [name].  Daughters has questions

## 2017-03-02 NOTE — Telephone Encounter (Signed)
Tried to call Rebecca [name] (daughter) unable to leave message mailbox full

## 2017-03-03 NOTE — Telephone Encounter (Signed)
Spoke with pt dtr, she wanted to let brittney know the patients WBC count had gone down to 14 and now it is back up to 18. She understood from brittney that she was going to send the lab work to [facility] and she wanted to make sure that it was mentioned to them that she had crackles in her lungs. She feels that may help her get more antibiotics if needed. Will forward to brittney for her review and advise

## 2017-03-04 ENCOUNTER — Other Ambulatory Visit: Payer: Self-pay | Admitting: Adult Health

## 2017-03-04 DIAGNOSIS — I2510 Atherosclerotic heart disease of native coronary artery without angina pectoris: Secondary | ICD-10-CM | POA: Diagnosis not present

## 2017-03-04 DIAGNOSIS — M25551 Pain in right hip: Secondary | ICD-10-CM | POA: Diagnosis not present

## 2017-03-04 DIAGNOSIS — E038 Other specified hypothyroidism: Secondary | ICD-10-CM | POA: Diagnosis not present

## 2017-03-04 DIAGNOSIS — J3089 Other allergic rhinitis: Secondary | ICD-10-CM | POA: Diagnosis not present

## 2017-03-04 DIAGNOSIS — J690 Pneumonitis due to inhalation of food and vomit: Secondary | ICD-10-CM | POA: Diagnosis not present

## 2017-03-04 DIAGNOSIS — Z6823 Body mass index (BMI) 23.0-23.9, adult: Secondary | ICD-10-CM | POA: Diagnosis not present

## 2017-03-04 DIAGNOSIS — D72829 Elevated white blood cell count, unspecified: Secondary | ICD-10-CM | POA: Diagnosis not present

## 2017-03-04 NOTE — Telephone Encounter (Signed)
She had mild wheezing, no fluid when I saw her. We have entered a referral for Pulmonology. My office note was forwarded to her PCP but please make sure labs are forwarded to them as well. Thank you.

## 2017-03-07 ENCOUNTER — Ambulatory Visit (HOSPITAL_BASED_OUTPATIENT_CLINIC_OR_DEPARTMENT_OTHER): Payer: Medicare Other | Admitting: Hematology and Oncology

## 2017-03-07 ENCOUNTER — Other Ambulatory Visit: Payer: Self-pay | Admitting: Hematology and Oncology

## 2017-03-07 ENCOUNTER — Other Ambulatory Visit (HOSPITAL_BASED_OUTPATIENT_CLINIC_OR_DEPARTMENT_OTHER): Payer: Medicare Other

## 2017-03-07 ENCOUNTER — Ambulatory Visit (HOSPITAL_BASED_OUTPATIENT_CLINIC_OR_DEPARTMENT_OTHER): Payer: Medicare Other

## 2017-03-07 ENCOUNTER — Telehealth: Payer: Self-pay | Admitting: *Deleted

## 2017-03-07 ENCOUNTER — Telehealth: Payer: Self-pay | Admitting: Hematology and Oncology

## 2017-03-07 VITALS — BP 129/75 | HR 84 | Temp 98.1°F | Resp 17 | Ht 63.0 in | Wt 139.7 lb

## 2017-03-07 DIAGNOSIS — N183 Chronic kidney disease, stage 3 unspecified: Secondary | ICD-10-CM

## 2017-03-07 DIAGNOSIS — R300 Dysuria: Secondary | ICD-10-CM

## 2017-03-07 DIAGNOSIS — I2510 Atherosclerotic heart disease of native coronary artery without angina pectoris: Secondary | ICD-10-CM

## 2017-03-07 DIAGNOSIS — D472 Monoclonal gammopathy: Secondary | ICD-10-CM

## 2017-03-07 DIAGNOSIS — G894 Chronic pain syndrome: Secondary | ICD-10-CM | POA: Diagnosis not present

## 2017-03-07 DIAGNOSIS — D631 Anemia in chronic kidney disease: Secondary | ICD-10-CM | POA: Diagnosis not present

## 2017-03-07 DIAGNOSIS — R5381 Other malaise: Secondary | ICD-10-CM

## 2017-03-07 DIAGNOSIS — D72829 Elevated white blood cell count, unspecified: Secondary | ICD-10-CM | POA: Diagnosis not present

## 2017-03-07 DIAGNOSIS — M7061 Trochanteric bursitis, right hip: Secondary | ICD-10-CM

## 2017-03-07 DIAGNOSIS — I5032 Chronic diastolic (congestive) heart failure: Secondary | ICD-10-CM | POA: Diagnosis not present

## 2017-03-07 DIAGNOSIS — R188 Other ascites: Secondary | ICD-10-CM

## 2017-03-07 DIAGNOSIS — J690 Pneumonitis due to inhalation of food and vomit: Secondary | ICD-10-CM

## 2017-03-07 DIAGNOSIS — N179 Acute kidney failure, unspecified: Secondary | ICD-10-CM

## 2017-03-07 DIAGNOSIS — N390 Urinary tract infection, site not specified: Secondary | ICD-10-CM | POA: Diagnosis not present

## 2017-03-07 LAB — CBC WITH DIFFERENTIAL/PLATELET
BASO%: 0.1 % (ref 0.0–2.0)
Basophils Absolute: 0 10*3/uL (ref 0.0–0.1)
EOS ABS: 0.1 10*3/uL (ref 0.0–0.5)
EOS%: 0.4 % (ref 0.0–7.0)
HEMATOCRIT: 31.1 % — AB (ref 34.8–46.6)
HEMOGLOBIN: 10.6 g/dL — AB (ref 11.6–15.9)
LYMPH#: 1.1 10*3/uL (ref 0.9–3.3)
LYMPH%: 5.5 % — ABNORMAL LOW (ref 14.0–49.7)
MCH: 31.1 pg (ref 25.1–34.0)
MCHC: 34.1 g/dL (ref 31.5–36.0)
MCV: 91.2 fL (ref 79.5–101.0)
MONO#: 0.8 10*3/uL (ref 0.1–0.9)
MONO%: 3.9 % (ref 0.0–14.0)
NEUT%: 90.1 % — ABNORMAL HIGH (ref 38.4–76.8)
NEUTROS ABS: 18.5 10*3/uL — AB (ref 1.5–6.5)
NRBC: 0 % (ref 0–0)
PLATELETS: 203 10*3/uL (ref 145–400)
RBC: 3.41 10*6/uL — ABNORMAL LOW (ref 3.70–5.45)
RDW: 13.6 % (ref 11.2–14.5)
WBC: 20.5 10*3/uL — AB (ref 3.9–10.3)

## 2017-03-07 LAB — URINALYSIS, MICROSCOPIC - CHCC
BILIRUBIN (URINE): NEGATIVE
BLOOD: NEGATIVE
Glucose: NEGATIVE mg/dL
Ketones: NEGATIVE mg/dL
NITRITE: NEGATIVE
Protein: 30 mg/dL
Specific Gravity, Urine: 1.005 (ref 1.003–1.035)
UROBILINOGEN UR: 0.2 mg/dL (ref 0.2–1)
pH: 6 (ref 4.6–8.0)

## 2017-03-07 MED ORDER — HYDROMORPHONE HCL 2 MG PO TABS: 2.0000 mg | ORAL_TABLET | ORAL | 0 refills | Status: DC | PRN

## 2017-03-07 MED ORDER — CEFUROXIME AXETIL 250 MG PO TABS: 250.0000 mg | ORAL_TABLET | Freq: Two times a day (BID) | ORAL | 0 refills | Status: DC

## 2017-03-07 NOTE — Telephone Encounter (Signed)
Gave avs and calendar for October  °

## 2017-03-07 NOTE — Telephone Encounter (Signed)
Notified that Dr Alvy Bimler has sent in antibiotic for UTI

## 2017-03-07 NOTE — Telephone Encounter (Signed)
Spoke with pt dtr, she is able to print the lab work from my chart to take to the appointment with her PCP this Friday. She does report the patient had to take 3 NTG over the weekend. Everything is fine now.

## 2017-03-08 ENCOUNTER — Encounter: Payer: Self-pay | Admitting: Hematology and Oncology

## 2017-03-08 DIAGNOSIS — N390 Urinary tract infection, site not specified: Secondary | ICD-10-CM | POA: Insufficient documentation

## 2017-03-08 DIAGNOSIS — R5381 Other malaise: Secondary | ICD-10-CM | POA: Insufficient documentation

## 2017-03-08 DIAGNOSIS — R188 Other ascites: Secondary | ICD-10-CM | POA: Insufficient documentation

## 2017-03-08 NOTE — Progress Notes (Signed)
[facility] OFFICE PROGRESS NOTE  Patient Care Team: [name], MD as PCP - General (Internal Medicine) Corliss Parish, MD as Consulting Physician (Nephrology) Debara Pickett Nadean Corwin, MD as Consulting Physician (Cardiology)  SUMMARY OF ONCOLOGIC HISTORY:  Rebecca [name] is here because of recently discovered IgG lambda MGUS. Patient herself appears to have some memory deficit. A lot of history was obtained through a review of chart and collaboration of history with her daughter She denies history of abnormal bone pain or bone fracture. The patient had history of polymyalgia rheumatica and had been on intermittent high-dose prednisone therapy in the past, currently on a slow prednisone taper. Patient denies history of recurrent infection or atypical infections such as shingles of meningitis. Denies chills, night sweats, anorexia or abnormal weight loss. The patient have numerous recurrent hospitalization for many reasons including recurrent aspiration pneumonia and others  INTERVAL HISTORY: Please see below for problem oriented charting. She returns for further follow-up with multiple family members and caregivers The patient complained of severe right hip pain recently new her medication was changed to hydrocodone but it was not controlling her pain She denies constipation She denies recent cough, fever or chills She complain of dysuria Her daughter told me that she went to the urologist office recently and had urinalysis but urine culture was not drawn Her daughter is concerned about recurrent urinary tract infection and requested additional workup Her daughter is very tearful about her problems with coping taking care of her mother From her last hospital stay, she was enrolled in palliative care and hospice but the patient had a very negative experience with the care team  REVIEW OF SYSTEMS:   Constitutional: Denies fevers, chills or abnormal weight loss Eyes:  Denies blurriness of vision Ears, nose, mouth, throat, and face: Denies mucositis or sore throat Respiratory: Denies cough, dyspnea or wheezes Cardiovascular: Denies palpitation, chest discomfort or lower extremity swelling Gastrointestinal:  Denies nausea, heartburn or change in bowel habits Skin: Denies abnormal skin rashes Lymphatics: Denies new lymphadenopathy or easy bruising Neurological:Denies numbness, tingling or new weaknesses Behavioral/Psych: Mood is stable, no new changes  All other systems were reviewed with the patient and are negative.  I have reviewed the past medical history, past surgical history, social history and family history with the patient and they are unchanged from previous note.  ALLERGIES:  is allergic to levaquin [levofloxacin] and tape.  MEDICATIONS:  Current Outpatient Prescriptions  Medication Sig Dispense Refill  . acetaminophen (TYLENOL) 500 MG tablet Take 1 tablet (500 mg total) by mouth every 6 (six) hours as needed (pain). (Patient taking differently: Take 1,000 mg by mouth 2 (two) times daily as needed for moderate pain. ) 30 tablet 0  . cefUROXime (CEFTIN) 250 MG tablet Take 1 tablet (250 mg total) by mouth 2 (two) times daily with a meal. 14 tablet 0  . cetirizine (ZYRTEC) 10 MG tablet Take 10 mg by mouth at bedtime.     . clopidogrel (PLAVIX) 75 MG tablet Take 1 tablet (75 mg total) by mouth daily with breakfast. (Patient taking differently: Take 75 mg by mouth at bedtime. ) 30 tablet 0  . cloZAPine (CLOZARIL) 100 MG tablet Take 300 mg by mouth at bedtime.  5  . Cranberry 250 MG TABS Take 1 tablet by mouth at bedtime.    Marland Kitchen denosumab (PROLIA) 60 MG/ML SOLN injection Inject 60 mg into the skin every 6 (six) months. Administer in upper arm, thigh, or abdomen    .  diltiazem (CARDIZEM LA) 180 MG 24 hr tablet Take 180 mg by mouth daily with breakfast.  0  . docusate sodium (COLACE) 100 MG capsule Take 200 mg by mouth at bedtime.  10 capsule 0  .  Fluticasone-Salmeterol (ADVAIR) 250-50 MCG/DOSE AEPB Inhale 1 puff into the lungs at bedtime.    . furosemide (LASIX) 20 MG tablet Take 1 tablet (20 mg total) by mouth daily as needed. (Patient taking differently: Take 20 mg by mouth daily as needed for fluid or edema. ) 30 tablet 3  . GUAIFENESIN PO Take 400 mg by mouth 2 (two) times daily.     Marland Kitchen HYDROmorphone (DILAUDID) 2 MG tablet Take 1 tablet (2 mg total) by mouth every 4 (four) hours as needed for severe pain. 60 tablet 0  . ipratropium-albuterol (DUONEB) 0.5-2.5 (3) MG/3ML SOLN Inhale 3 mLs into the lungs 3 (three) times daily.   3  . isosorbide mononitrate (IMDUR) 60 MG 24 hr tablet Take 1 tablet (60 mg total) by mouth daily. 30 tablet 6  . levothyroxine (SYNTHROID, LEVOTHROID) 75 MCG tablet Take 75 mcg by mouth daily before breakfast.    . metoprolol succinate (TOPROL-XL) 100 MG 24 hr tablet Take 100 mg by mouth daily with breakfast. Take with or immediately following a meal.    . Multiple Vitamins-Minerals (MULTIVITAMIN WITH MINERALS) tablet Take 1 tablet by mouth at bedtime.     . nitrofurantoin (MACRODANTIN) 100 MG capsule Take 100 mg by mouth at bedtime. Filled 01/11/17 x 30 days  6  . nitroGLYCERIN (NITROSTAT) 0.4 MG SL tablet Place 1 tablet (0.4 mg total) under the tongue every 5 (five) minutes as needed. For chest pain 25 tablet 3  . ondansetron (ZOFRAN) 4 MG tablet Take 1 tablet (4 mg total) by mouth every 8 (eight) hours as needed for nausea or vomiting. 18 tablet 0  . pantoprazole (PROTONIX) 40 MG tablet Take 1 tablet (40 mg total) by mouth daily. 30 tablet 11  . polyethylene glycol powder (GLYCOLAX/MIRALAX) powder Take 17 g by mouth 2 (two) times daily. [place]    . pravastatin (PRAVACHOL) 20 MG tablet Take 20 mg by mouth at bedtime.    . predniSONE (DELTASONE) 10 MG tablet Take 1 tablet (10 mg total) by mouth daily with breakfast. 30 tablet 0  . Probiotic Product (PROBIOTIC PO) Take 1 capsule by mouth daily.    Marland Kitchen senna  (SENOKOT) 8.6 MG TABS tablet Take 2 tablets (17.2 mg total) by mouth at bedtime.    . Tiotropium Bromide Monohydrate (SPIRIVA RESPIMAT) 2.5 MCG/ACT AERS Inhale 2 puffs into the lungs every morning.     . traMADol (ULTRAM) 50 MG tablet Take 100 mg by mouth 2 (two) times daily as needed for pain.  3  . traMADol (ULTRAM-ER) 300 MG 24 hr tablet Take 300 mg by mouth every morning.    . Vitamin D, Ergocalciferol, (DRISDOL) 50000 UNITS CAPS capsule Take 1 capsule (50,000 Units total) by mouth every 7 (seven) days. 4 capsule 0   No current facility-administered medications for this visit.     PHYSICAL EXAMINATION: ECOG PERFORMANCE STATUS: 2 - Symptomatic, <50% confined to bed  Vitals:   03/07/17 1459  BP: 129/75  Pulse: 84  Resp: 17  Temp: 98.1 F (36.7 C)  SpO2: 99%   Filed Weights   03/07/17 1459  Weight: 139 lb 11.2 oz (63.4 kg)    GENERAL:alert, no distress and comfortable.  She appears cushingoid SKIN: Noted skin bruises EYES: normal,  Conjunctiva are pink and non-injected, sclera clear OROPHARYNX:no exudate, no erythema and lips, buccal mucosa, and tongue normal  NECK: supple, thyroid normal size, non-tender, without nodularity LYMPH:  no palpable lymphadenopathy in the cervical, axillary or inguinal LUNGS: clear to auscultation and percussion with normal breathing effort HEART: regular rate & rhythm and no murmurs and no lower extremity edema ABDOMEN:abdomen soft, non-tender and normal bowel sounds.  Abdomen is distended with ascites Musculoskeletal:no cyanosis of digits and no clubbing  NEURO: alert & oriented x 3 with fluent speech, no focal motor/sensory deficits  LABORATORY DATA:  I have reviewed the data as listed    Component Value Date/Time   NA 134 (L) 01/27/2017 0419   NA 135 01/11/2017 1250   NA 141 09/27/2016 1047   K 4.2 01/27/2017 0419   K 4.8 09/27/2016 1047   CL 101 01/27/2017 0419   CO2 26 01/27/2017 0419   CO2 24 09/27/2016 1047   GLUCOSE 94 01/27/2017  0419   GLUCOSE 148 (H) 09/27/2016 1047   BUN 24 (H) 01/27/2017 0419   BUN 37 (H) 01/11/2017 1250   BUN 25.6 09/27/2016 1047   CREATININE 1.21 (H) 01/27/2017 0419   CREATININE 1.4 (H) 09/27/2016 1047   CALCIUM 9.0 01/27/2017 0419   CALCIUM 9.4 09/27/2016 1047   PROT 6.1 (L) 01/26/2017 0530   PROT 5.9 (L) 09/27/2016 1047   PROT 6.4 09/27/2016 1047   ALBUMIN 3.3 (L) 01/26/2017 0530   ALBUMIN 3.1 (L) 09/27/2016 1047   AST 15 01/26/2017 0530   AST 14 09/27/2016 1047   ALT 13 (L) 01/26/2017 0530   ALT 16 09/27/2016 1047   ALKPHOS 37 (L) 01/26/2017 0530   ALKPHOS 42 09/27/2016 1047   BILITOT 0.4 01/26/2017 0530   BILITOT 0.33 09/27/2016 1047   GFRNONAA 42 (L) 01/27/2017 0419   GFRAA 49 (L) 01/27/2017 0419    No results found for: SPEP, UPEP  Lab Results  Component Value Date   WBC 20.5 (H) 03/07/2017   NEUTROABS 18.5 (H) 03/07/2017   HGB 10.6 (L) 03/07/2017   HCT 31.1 (L) 03/07/2017   MCV 91.2 03/07/2017   PLT 203 03/07/2017      Chemistry      Component Value Date/Time   NA 134 (L) 01/27/2017 0419   NA 135 01/11/2017 1250   NA 141 09/27/2016 1047   K 4.2 01/27/2017 0419   K 4.8 09/27/2016 1047   CL 101 01/27/2017 0419   CO2 26 01/27/2017 0419   CO2 24 09/27/2016 1047   BUN 24 (H) 01/27/2017 0419   BUN 37 (H) 01/11/2017 1250   BUN 25.6 09/27/2016 1047   CREATININE 1.21 (H) 01/27/2017 0419   CREATININE 1.4 (H) 09/27/2016 1047   GLU 79 09/20/2016      Component Value Date/Time   CALCIUM 9.0 01/27/2017 0419   CALCIUM 9.4 09/27/2016 1047   ALKPHOS 37 (L) 01/26/2017 0530   ALKPHOS 42 09/27/2016 1047   AST 15 01/26/2017 0530   AST 14 09/27/2016 1047   ALT 13 (L) 01/26/2017 0530   ALT 16 09/27/2016 1047   BILITOT 0.4 01/26/2017 0530   BILITOT 0.33 09/27/2016 1047      ASSESSMENT & PLAN:  MGUS (monoclonal gammopathy of unknown significance) Clinically, she does not appear to have signs and symptoms to suggest multiple myeloma. The anemia and chronic kidney  disease are unrelated to MGUS. 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 She probably have acute on chronic leukocytosis due to active bladder infection We will start her on antibiotics  Recurrent UTI She has history of recurrent urinary tract infection The patient is profoundly uncomfortable and appears symptomatic Preliminary urinalysis showed numerous bacteria Based on her priors culture and sensitivity, I will prescribed Ceftin  Physical debility The patient is profoundly weak She have recurrent infection and is severely immunocompromised She had recurrent hospitalization From her last hospital stay, she has been enroll in palliative care and hospice Her daughter is very tearful and informed me of her negative experience with the current hospice program I recommend transitioning her care to advance home care instead I will get an RN to assess pain control, blood pressure management and look for signs or symptoms of infection I will see the patient on the monthly basis for supportive care   Chronic pain disorder She has chronic pain disorder She was on high-dose tramadol but it was not enough to control her pain Recently, she was started on hydrocodone and according to the patient, it is not controlling her hip pain either I recommend increasing the dose of her pain medicine and change it to Dilaudid instead I warned the patient and family members about the risk of nausea, sedation and constipation I will reevaluate pain  management in our next visit  Ascites Her abdomen is distended with ascites I recommend therapeutic paracentesis for relief We discussed holding Plavix for at least 5 days before therapeutic paracentesis   Orders Placed This Encounter  Procedures  . Urine Culture    Standing Status:   Future    Number of Occurrences:   1    Standing Expiration Date:   04/11/2018  . US Paracentesis    Standing Status:   Future    Standing Expiration Date:   03/07/2018    Scheduling Instructions:     Patient is on Plavix. Please schedule 10/1 and remind to hold plavix for 5 days prior    Order Specific Question:   If therapeutic, is there a maximum amount of fluid to be removed?    Answer:   Yes    Order Specific Question:   What is the maximum amount of fluide to be removed?    Answer:   5 liter    Order Specific Question:   Are labs required for specimen collection?    Answer:   No    Order Specific Question:   Is Albumin medication needed?    Answer:   No    Order Specific Question:   Reason for Exam (SYMPTOM  OR DIAGNOSIS REQUIRED)    Answer:   ascites    Order Specific Question:   Preferred imaging location?    Answer:   [facility]  . Urinalysis, Microscopic - [facility]    Standing Status:   Future    Number of Occurrences:   1    Standing Expiration Date:   04/11/2018  . Ambulatory referral to Home Health    Referral Priority:   Routine    Referral Type:   Home Health Care    Referral Reason:   Specialty  Services Required    Requested Specialty:   [place]    Number of Visits Requested:   1   All questions were answered. The patient knows to call the clinic with any problems, questions or concerns. No barriers to learning was detected. I spent 40 minutes counseling the patient face to face. The total time spent in the appointment was 70 minutes and more than 50% was on counseling and review of test results     Heath Lark, MD 03/08/2017 4:26 PM

## 2017-03-08 NOTE — Assessment & Plan Note (Signed)
Her abdomen is distended with ascites I recommend therapeutic paracentesis for relief We discussed holding Plavix for at least 5 days before therapeutic paracentesis

## 2017-03-08 NOTE — Assessment & Plan Note (Signed)
She has history of recurrent urinary tract infection The patient is profoundly uncomfortable and appears symptomatic Preliminary urinalysis showed numerous bacteria Based on her priors culture and sensitivity, I will prescribed Ceftin

## 2017-03-08 NOTE — Assessment & Plan Note (Signed)
She has chronic pain disorder She was on high-dose tramadol but it was not enough to control her pain Recently, she was started on hydrocodone and according to the patient, it is not controlling her hip pain either I recommend increasing the dose of her pain medicine and change it to Dilaudid instead I warned the patient and family members about the risk of nausea, sedation and constipation I will reevaluate pain management in our next visit

## 2017-03-08 NOTE — Assessment & Plan Note (Signed)
The patient is profoundly weak She have recurrent infection and is severely immunocompromised She had recurrent hospitalization From her last hospital stay, she has been enroll in palliative care and hospice Her daughter is very tearful and informed me of her negative experience with the current hospice program I recommend transitioning her care to advance home care instead I will get an RN to assess pain control, blood pressure management and look for signs or symptoms of infection I will see the patient on the monthly basis for supportive care

## 2017-03-08 NOTE — Assessment & Plan Note (Signed)
She does not need darbepoetin injection of blood transfusion Monitor closely

## 2017-03-08 NOTE — Assessment & Plan Note (Signed)
Clinically, she does not appear to have signs and symptoms to suggest multiple myeloma. 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 I would recommend rechecking her blood work and exam annually Repeat myeloma panel in April 2018 is stable 

## 2017-03-08 NOTE — Assessment & Plan Note (Signed)
She have chronic leukocytosis due to prednisone therapy She probably have acute on chronic leukocytosis due to active bladder infection We will start her on antibiotics

## 2017-03-08 NOTE — Assessment & Plan Note (Signed)
The patient have history of recurrent aspiration pneumonia She is in the process of being referred to see pulmonologist for further management

## 2017-03-10 ENCOUNTER — Telehealth: Payer: Self-pay | Admitting: *Deleted

## 2017-03-10 DIAGNOSIS — Z7952 Long term (current) use of systemic steroids: Secondary | ICD-10-CM | POA: Diagnosis not present

## 2017-03-10 DIAGNOSIS — D472 Monoclonal gammopathy: Secondary | ICD-10-CM | POA: Diagnosis not present

## 2017-03-10 DIAGNOSIS — N390 Urinary tract infection, site not specified: Secondary | ICD-10-CM | POA: Diagnosis not present

## 2017-03-10 DIAGNOSIS — D631 Anemia in chronic kidney disease: Secondary | ICD-10-CM | POA: Diagnosis not present

## 2017-03-10 DIAGNOSIS — N189 Chronic kidney disease, unspecified: Secondary | ICD-10-CM | POA: Diagnosis not present

## 2017-03-10 DIAGNOSIS — Z8701 Personal history of pneumonia (recurrent): Secondary | ICD-10-CM | POA: Diagnosis not present

## 2017-03-10 DIAGNOSIS — M353 Polymyalgia rheumatica: Secondary | ICD-10-CM | POA: Diagnosis not present

## 2017-03-10 DIAGNOSIS — J690 Pneumonitis due to inhalation of food and vomit: Secondary | ICD-10-CM | POA: Diagnosis not present

## 2017-03-10 DIAGNOSIS — G894 Chronic pain syndrome: Secondary | ICD-10-CM | POA: Diagnosis not present

## 2017-03-10 DIAGNOSIS — M25551 Pain in right hip: Secondary | ICD-10-CM | POA: Diagnosis not present

## 2017-03-10 DIAGNOSIS — Z87440 Personal history of urinary (tract) infections: Secondary | ICD-10-CM | POA: Diagnosis not present

## 2017-03-10 LAB — URINE CULTURE

## 2017-03-10 NOTE — Telephone Encounter (Signed)
Spoke with daughter. States Mrs Borel is a little better with antibiotics. [facility] has come to home.  [name] states she forgot to tell Dr Alvy Bimler about pt's severe nights sweats- pillows are wet and sheets are wet all the way through mattress pad. Last night was the first night that it was a little better. Pt has history of urine cultures positive for pseudomonas from catheters- took Monurol with good success. States it is $100 and requires prior [place]. Is upset with one of the MDs she asked to run a urine culture recently and they had not done so. Took 2 rounds of ceftin on 8/31 and 9/15 for lungs. States patient is still having a lot of pain.

## 2017-03-10 NOTE — Telephone Encounter (Signed)
-----   Message from Heath Lark, MD sent at 03/10/2017 11:39 AM EDT ----- Regarding: UTI Please call her daughter 1) is she better with antibiotics? 2) has [facility] see them? ----- Message ----- From: Interface, Lab In Three Zero One Sent: 03/07/2017   4:18 PM To: Heath Lark, MD

## 2017-03-10 NOTE — Telephone Encounter (Signed)
Notified of message below

## 2017-03-10 NOTE — Telephone Encounter (Signed)
Unknown cause of night sweats. Could be due to anything including infection I did not prescribe Monurol; who ever did have to do the prior auth I prescribed Ceftin She can titrate up the Dilaudid. She was started on 2 mg tab. Can try 4 mg q 4-6 hours prn Po

## 2017-03-11 DIAGNOSIS — Z23 Encounter for immunization: Secondary | ICD-10-CM | POA: Diagnosis not present

## 2017-03-11 NOTE — Progress Notes (Signed)
[facility]  Alerted by [facility] team that daughter called into the office with the following concerns. Relayed below to Harrel Lemon, RN for [name].  1. Patient complaining of pain with urination, frequency.  Daughter states uring is dark despite drinking frequently.  Requesting Verbal Order for UA,  C and S, we can run that at next nsg visit.   2. Daughter said [name] found a hip fracture at most recent visit.  They would like a referral to either Dr. Alvan Dame, Maureen Ralphs, or Mayer Camel at [facility] orthopedics to evaluate and determine course of treatment.  Verbal Order received from Harrel Lemon, RN for UA with C&S.  If patient discharges after hours, please call [phone].   Larry Sierras 03/11/2017, 4:31 PM

## 2017-03-14 ENCOUNTER — Other Ambulatory Visit (HOSPITAL_COMMUNITY): Payer: Self-pay

## 2017-03-14 ENCOUNTER — Inpatient Hospital Stay (HOSPITAL_COMMUNITY)
Admission: EM | Admit: 2017-03-14 | Discharge: 2017-03-26 | DRG: 551 | Disposition: A | Discharge status: Skilled Nursing Facility | Payer: Medicare Other | Attending: Internal Medicine | Admitting: Internal Medicine

## 2017-03-14 ENCOUNTER — Encounter (HOSPITAL_COMMUNITY): Payer: Self-pay | Admitting: *Deleted

## 2017-03-14 ENCOUNTER — Other Ambulatory Visit: Payer: Self-pay | Admitting: Hematology and Oncology

## 2017-03-14 ENCOUNTER — Emergency Department (HOSPITAL_COMMUNITY): Payer: Medicare Other

## 2017-03-14 DIAGNOSIS — E1122 Type 2 diabetes mellitus with diabetic chronic kidney disease: Secondary | ICD-10-CM | POA: Diagnosis present

## 2017-03-14 DIAGNOSIS — D472 Monoclonal gammopathy: Secondary | ICD-10-CM | POA: Diagnosis not present

## 2017-03-14 DIAGNOSIS — M7061 Trochanteric bursitis, right hip: Secondary | ICD-10-CM

## 2017-03-14 DIAGNOSIS — Z7982 Long term (current) use of aspirin: Secondary | ICD-10-CM

## 2017-03-14 DIAGNOSIS — E039 Hypothyroidism, unspecified: Secondary | ICD-10-CM | POA: Diagnosis present

## 2017-03-14 DIAGNOSIS — Z66 Do not resuscitate: Secondary | ICD-10-CM | POA: Diagnosis present

## 2017-03-14 DIAGNOSIS — M47817 Spondylosis without myelopathy or radiculopathy, lumbosacral region: Principal | ICD-10-CM | POA: Diagnosis present

## 2017-03-14 DIAGNOSIS — Z515 Encounter for palliative care: Secondary | ICD-10-CM | POA: Diagnosis not present

## 2017-03-14 DIAGNOSIS — E871 Hypo-osmolality and hyponatremia: Secondary | ICD-10-CM | POA: Diagnosis present

## 2017-03-14 DIAGNOSIS — Z79899 Other long term (current) drug therapy: Secondary | ICD-10-CM

## 2017-03-14 DIAGNOSIS — R339 Retention of urine, unspecified: Secondary | ICD-10-CM | POA: Diagnosis present

## 2017-03-14 DIAGNOSIS — Z8673 Personal history of transient ischemic attack (TIA), and cerebral infarction without residual deficits: Secondary | ICD-10-CM

## 2017-03-14 DIAGNOSIS — E785 Hyperlipidemia, unspecified: Secondary | ICD-10-CM | POA: Diagnosis present

## 2017-03-14 DIAGNOSIS — S79911A Unspecified injury of right hip, initial encounter: Secondary | ICD-10-CM | POA: Diagnosis not present

## 2017-03-14 DIAGNOSIS — M810 Age-related osteoporosis without current pathological fracture: Secondary | ICD-10-CM | POA: Diagnosis present

## 2017-03-14 DIAGNOSIS — R9431 Abnormal electrocardiogram [ECG] [EKG]: Secondary | ICD-10-CM | POA: Diagnosis not present

## 2017-03-14 DIAGNOSIS — M25551 Pain in right hip: Secondary | ICD-10-CM | POA: Insufficient documentation

## 2017-03-14 DIAGNOSIS — R221 Localized swelling, mass and lump, neck: Secondary | ICD-10-CM | POA: Diagnosis not present

## 2017-03-14 DIAGNOSIS — E8770 Fluid overload, unspecified: Secondary | ICD-10-CM

## 2017-03-14 DIAGNOSIS — N179 Acute kidney failure, unspecified: Secondary | ICD-10-CM | POA: Diagnosis not present

## 2017-03-14 DIAGNOSIS — G9341 Metabolic encephalopathy: Secondary | ICD-10-CM | POA: Diagnosis present

## 2017-03-14 DIAGNOSIS — Z87440 Personal history of urinary (tract) infections: Secondary | ICD-10-CM

## 2017-03-14 DIAGNOSIS — Z7952 Long term (current) use of systemic steroids: Secondary | ICD-10-CM

## 2017-03-14 DIAGNOSIS — R918 Other nonspecific abnormal finding of lung field: Secondary | ICD-10-CM | POA: Diagnosis not present

## 2017-03-14 DIAGNOSIS — K5909 Other constipation: Secondary | ICD-10-CM | POA: Diagnosis not present

## 2017-03-14 DIAGNOSIS — J189 Pneumonia, unspecified organism: Secondary | ICD-10-CM | POA: Diagnosis not present

## 2017-03-14 DIAGNOSIS — R5383 Other fatigue: Secondary | ICD-10-CM

## 2017-03-14 DIAGNOSIS — I5033 Acute on chronic diastolic (congestive) heart failure: Secondary | ICD-10-CM | POA: Diagnosis present

## 2017-03-14 DIAGNOSIS — I130 Hypertensive heart and chronic kidney disease with heart failure and stage 1 through stage 4 chronic kidney disease, or unspecified chronic kidney disease: Secondary | ICD-10-CM | POA: Diagnosis not present

## 2017-03-14 DIAGNOSIS — D631 Anemia in chronic kidney disease: Secondary | ICD-10-CM | POA: Diagnosis present

## 2017-03-14 DIAGNOSIS — G8929 Other chronic pain: Secondary | ICD-10-CM | POA: Diagnosis present

## 2017-03-14 DIAGNOSIS — J449 Chronic obstructive pulmonary disease, unspecified: Secondary | ICD-10-CM | POA: Diagnosis present

## 2017-03-14 DIAGNOSIS — R531 Weakness: Secondary | ICD-10-CM

## 2017-03-14 DIAGNOSIS — N184 Chronic kidney disease, stage 4 (severe): Secondary | ICD-10-CM | POA: Diagnosis not present

## 2017-03-14 DIAGNOSIS — I700 Atherosclerosis of aorta: Secondary | ICD-10-CM | POA: Diagnosis present

## 2017-03-14 DIAGNOSIS — R1310 Dysphagia, unspecified: Secondary | ICD-10-CM | POA: Diagnosis present

## 2017-03-14 DIAGNOSIS — E861 Hypovolemia: Secondary | ICD-10-CM | POA: Diagnosis present

## 2017-03-14 DIAGNOSIS — J9811 Atelectasis: Secondary | ICD-10-CM | POA: Diagnosis present

## 2017-03-14 DIAGNOSIS — N189 Chronic kidney disease, unspecified: Secondary | ICD-10-CM

## 2017-03-14 DIAGNOSIS — I5032 Chronic diastolic (congestive) heart failure: Secondary | ICD-10-CM | POA: Diagnosis present

## 2017-03-14 DIAGNOSIS — R188 Other ascites: Secondary | ICD-10-CM

## 2017-03-14 DIAGNOSIS — I2510 Atherosclerotic heart disease of native coronary artery without angina pectoris: Secondary | ICD-10-CM | POA: Diagnosis present

## 2017-03-14 DIAGNOSIS — N390 Urinary tract infection, site not specified: Secondary | ICD-10-CM | POA: Diagnosis not present

## 2017-03-14 DIAGNOSIS — J690 Pneumonitis due to inhalation of food and vomit: Secondary | ICD-10-CM | POA: Diagnosis present

## 2017-03-14 DIAGNOSIS — Z7902 Long term (current) use of antithrombotics/antiplatelets: Secondary | ICD-10-CM

## 2017-03-14 DIAGNOSIS — G894 Chronic pain syndrome: Secondary | ICD-10-CM | POA: Diagnosis present

## 2017-03-14 DIAGNOSIS — K219 Gastro-esophageal reflux disease without esophagitis: Secondary | ICD-10-CM | POA: Diagnosis present

## 2017-03-14 DIAGNOSIS — F259 Schizoaffective disorder, unspecified: Secondary | ICD-10-CM | POA: Diagnosis present

## 2017-03-14 DIAGNOSIS — M353 Polymyalgia rheumatica: Secondary | ICD-10-CM | POA: Diagnosis present

## 2017-03-14 DIAGNOSIS — Z87891 Personal history of nicotine dependence: Secondary | ICD-10-CM

## 2017-03-14 DIAGNOSIS — Z955 Presence of coronary angioplasty implant and graft: Secondary | ICD-10-CM

## 2017-03-14 DIAGNOSIS — I10 Essential (primary) hypertension: Secondary | ICD-10-CM | POA: Diagnosis present

## 2017-03-14 LAB — CBC WITH DIFFERENTIAL/PLATELET
Basophils Absolute: 0 10*3/uL (ref 0.0–0.1)
Basophils Relative: 0 %
EOS ABS: 0.2 10*3/uL (ref 0.0–0.7)
EOS PCT: 1 %
HCT: 29.6 % — ABNORMAL LOW (ref 36.0–46.0)
Hemoglobin: 10.3 g/dL — ABNORMAL LOW (ref 12.0–15.0)
LYMPHS ABS: 1.7 10*3/uL (ref 0.7–4.0)
LYMPHS PCT: 11 %
MCH: 30.5 pg (ref 26.0–34.0)
MCHC: 34.8 g/dL (ref 30.0–36.0)
MCV: 87.6 fL (ref 78.0–100.0)
MONO ABS: 0.9 10*3/uL (ref 0.1–1.0)
MONOS PCT: 6 %
Neutro Abs: 12.8 10*3/uL — ABNORMAL HIGH (ref 1.7–7.7)
Neutrophils Relative %: 82 %
PLATELETS: 230 10*3/uL (ref 150–400)
RBC: 3.38 MIL/uL — AB (ref 3.87–5.11)
RDW: 13.5 % (ref 11.5–15.5)
WBC: 15.5 10*3/uL — ABNORMAL HIGH (ref 4.0–10.5)

## 2017-03-14 LAB — URINALYSIS, ROUTINE W REFLEX MICROSCOPIC
Bilirubin Urine: NEGATIVE
GLUCOSE, UA: NEGATIVE mg/dL
HGB URINE DIPSTICK: NEGATIVE
Ketones, ur: NEGATIVE mg/dL
Nitrite: NEGATIVE
PH: 6 (ref 5.0–8.0)
Protein, ur: 30 mg/dL — AB
Specific Gravity, Urine: 1.008 (ref 1.005–1.030)

## 2017-03-14 LAB — I-STAT TROPONIN, ED: Troponin i, poc: 0 ng/mL (ref 0.00–0.08)

## 2017-03-14 LAB — COMPREHENSIVE METABOLIC PANEL
ALT: 14 U/L (ref 14–54)
ANION GAP: 11 (ref 5–15)
AST: 14 U/L — ABNORMAL LOW (ref 15–41)
Albumin: 3.4 g/dL — ABNORMAL LOW (ref 3.5–5.0)
Alkaline Phosphatase: 42 U/L (ref 38–126)
BUN: 31 mg/dL — ABNORMAL HIGH (ref 6–20)
CALCIUM: 9.5 mg/dL (ref 8.9–10.3)
CHLORIDE: 94 mmol/L — AB (ref 101–111)
CO2: 25 mmol/L (ref 22–32)
CREATININE: 1.13 mg/dL — AB (ref 0.44–1.00)
GFR, EST AFRICAN AMERICAN: 53 mL/min — AB (ref >60)
GFR, EST NON AFRICAN AMERICAN: 45 mL/min — AB (ref >60)
Glucose, Bld: 120 mg/dL — ABNORMAL HIGH (ref 65–99)
Potassium: 4.5 mmol/L (ref 3.5–5.1)
SODIUM: 130 mmol/L — AB (ref 135–145)
Total Bilirubin: 0.3 mg/dL (ref 0.3–1.2)
Total Protein: 6.3 g/dL — ABNORMAL LOW (ref 6.5–8.1)

## 2017-03-14 LAB — I-STAT CG4 LACTIC ACID, ED: LACTIC ACID, VENOUS: 0.55 mmol/L (ref 0.5–1.9)

## 2017-03-14 MED ORDER — PRAVASTATIN SODIUM 20 MG PO TABS
20.0000 mg | ORAL_TABLET | Freq: Every day | ORAL | Status: DC
  Administered 2017-03-14 – 2017-03-25 (×12): 20 mg via ORAL
  Filled 2017-03-14 (×12): qty 1

## 2017-03-14 MED ORDER — IPRATROPIUM-ALBUTEROL 0.5-2.5 (3) MG/3ML IN SOLN
3.0000 mL | Freq: Three times a day (TID) | RESPIRATORY_TRACT | Status: DC
  Administered 2017-03-14 – 2017-03-15 (×2): 3 mL via RESPIRATORY_TRACT
  Filled 2017-03-14 (×2): qty 3

## 2017-03-14 MED ORDER — METOPROLOL SUCCINATE ER 50 MG PO TB24
100.0000 mg | ORAL_TABLET | Freq: Every day | ORAL | Status: DC
Start: 2017-03-15 — End: 2017-03-21
  Administered 2017-03-15 – 2017-03-21 (×7): 100 mg via ORAL
  Filled 2017-03-14 (×7): qty 2

## 2017-03-14 MED ORDER — ENOXAPARIN SODIUM 40 MG/0.4ML ~~LOC~~ SOLN
40.0000 mg | SUBCUTANEOUS | Status: DC
  Administered 2017-03-14 – 2017-03-20 (×7): 40 mg via SUBCUTANEOUS
  Filled 2017-03-14 (×7): qty 0.4

## 2017-03-14 MED ORDER — PIPERACILLIN-TAZOBACTAM 3.375 G IVPB 30 MIN
3.3750 g | Freq: Once | INTRAVENOUS | Status: AC
  Administered 2017-03-14: 3.375 g via INTRAVENOUS
  Filled 2017-03-14: qty 50

## 2017-03-14 MED ORDER — MOMETASONE FURO-FORMOTEROL FUM 200-5 MCG/ACT IN AERO
2.0000 | INHALATION_SPRAY | Freq: Two times a day (BID) | RESPIRATORY_TRACT | Status: DC
  Administered 2017-03-14 – 2017-03-26 (×21): 2 via RESPIRATORY_TRACT
  Filled 2017-03-14: qty 8.8

## 2017-03-14 MED ORDER — SODIUM CHLORIDE 0.9 % IV SOLN: 250.0000 mL | INTRAVENOUS | Status: DC | PRN

## 2017-03-14 MED ORDER — ACETAMINOPHEN 500 MG PO TABS
500.0000 mg | ORAL_TABLET | Freq: Four times a day (QID) | ORAL | Status: DC | PRN
  Administered 2017-03-14 – 2017-03-16 (×5): 500 mg via ORAL
  Filled 2017-03-14 (×6): qty 1

## 2017-03-14 MED ORDER — PREDNISONE 5 MG PO TABS
10.0000 mg | ORAL_TABLET | Freq: Every day | ORAL | Status: DC
  Administered 2017-03-15 – 2017-03-26 (×12): 10 mg via ORAL
  Filled 2017-03-14 (×13): qty 2

## 2017-03-14 MED ORDER — ONDANSETRON HCL 4 MG PO TABS: 4.0000 mg | ORAL_TABLET | Freq: Four times a day (QID) | ORAL | Status: DC | PRN

## 2017-03-14 MED ORDER — IPRATROPIUM-ALBUTEROL 0.5-2.5 (3) MG/3ML IN SOLN
3.0000 mL | Freq: Once | RESPIRATORY_TRACT | Status: AC
  Administered 2017-03-14: 3 mL via RESPIRATORY_TRACT
  Filled 2017-03-14: qty 3

## 2017-03-14 MED ORDER — ISOSORBIDE MONONITRATE ER 60 MG PO TB24
60.0000 mg | ORAL_TABLET | Freq: Every day | ORAL | Status: DC
  Administered 2017-03-15 – 2017-03-21 (×6): 60 mg via ORAL
  Filled 2017-03-14 (×6): qty 1

## 2017-03-14 MED ORDER — PIPERACILLIN-TAZOBACTAM 3.375 G IVPB
3.3750 g | Freq: Three times a day (TID) | INTRAVENOUS | Status: DC
  Administered 2017-03-14: 3.375 g via INTRAVENOUS
  Filled 2017-03-14 (×6): qty 50

## 2017-03-14 MED ORDER — HYDROMORPHONE HCL 4 MG PO TABS
2.0000 mg | ORAL_TABLET | ORAL | Status: DC | PRN
Start: 2017-03-14 — End: 2017-03-16
  Administered 2017-03-14 – 2017-03-15 (×2): 2 mg via ORAL
  Filled 2017-03-14 (×4): qty 1

## 2017-03-14 MED ORDER — VANCOMYCIN HCL IN DEXTROSE 1-5 GM/200ML-% IV SOLN
1000.0000 mg | Freq: Once | INTRAVENOUS | Status: AC
  Administered 2017-03-14: 1000 mg via INTRAVENOUS
  Filled 2017-03-14: qty 200

## 2017-03-14 MED ORDER — ASPIRIN EC 81 MG PO TBEC
81.0000 mg | DELAYED_RELEASE_TABLET | Freq: Every day | ORAL | Status: DC | PRN
  Administered 2017-03-15: 81 mg via ORAL
  Filled 2017-03-14: qty 1

## 2017-03-14 MED ORDER — LORATADINE 10 MG PO TABS
10.0000 mg | ORAL_TABLET | Freq: Every day | ORAL | Status: DC
  Administered 2017-03-15 – 2017-03-20 (×6): 10 mg via ORAL
  Filled 2017-03-14 (×6): qty 1

## 2017-03-14 MED ORDER — CLOZAPINE 100 MG PO TABS
300.0000 mg | ORAL_TABLET | Freq: Every day | ORAL | Status: DC
  Administered 2017-03-14 – 2017-03-23 (×10): 300 mg via ORAL
  Filled 2017-03-14 (×10): qty 3

## 2017-03-14 MED ORDER — SODIUM CHLORIDE 0.9% FLUSH: 3.0000 mL | INTRAVENOUS | Status: DC | PRN

## 2017-03-14 MED ORDER — DILTIAZEM HCL ER 180 MG PO CP24
180.0000 mg | ORAL_CAPSULE | Freq: Every day | ORAL | Status: DC
  Filled 2017-03-14: qty 1

## 2017-03-14 MED ORDER — PANTOPRAZOLE SODIUM 40 MG PO TBEC
40.0000 mg | DELAYED_RELEASE_TABLET | Freq: Every day | ORAL | Status: DC
  Administered 2017-03-15 – 2017-03-21 (×7): 40 mg via ORAL
  Filled 2017-03-14 (×7): qty 1

## 2017-03-14 MED ORDER — LEVOTHYROXINE SODIUM 75 MCG PO TABS
75.0000 ug | ORAL_TABLET | Freq: Every day | ORAL | Status: DC
  Administered 2017-03-15 – 2017-03-17 (×3): 75 ug via ORAL
  Filled 2017-03-14: qty 3
  Filled 2017-03-14: qty 1
  Filled 2017-03-14 (×2): qty 3
  Filled 2017-03-14 (×2): qty 1

## 2017-03-14 MED ORDER — HYDROCORTISONE NA SUCCINATE PF 100 MG IJ SOLR
100.0000 mg | Freq: Once | INTRAMUSCULAR | Status: AC
  Administered 2017-03-14: 100 mg via INTRAVENOUS
  Filled 2017-03-14: qty 2

## 2017-03-14 MED ORDER — SODIUM CHLORIDE 0.9% FLUSH
3.0000 mL | Freq: Two times a day (BID) | INTRAVENOUS | Status: DC
  Administered 2017-03-15 – 2017-03-21 (×12): 3 mL via INTRAVENOUS

## 2017-03-14 MED ORDER — GUAIFENESIN ER 600 MG PO TB12
600.0000 mg | ORAL_TABLET | Freq: Two times a day (BID) | ORAL | Status: DC
  Administered 2017-03-14 – 2017-03-26 (×22): 600 mg via ORAL
  Filled 2017-03-14 (×23): qty 1

## 2017-03-14 MED ORDER — POLYETHYLENE GLYCOL 3350 17 G PO PACK
17.0000 g | PACK | Freq: Two times a day (BID) | ORAL | Status: DC
  Administered 2017-03-14 – 2017-03-26 (×19): 17 g via ORAL
  Filled 2017-03-14 (×22): qty 1

## 2017-03-14 MED ORDER — DILTIAZEM HCL ER COATED BEADS 180 MG PO CP24
180.0000 mg | ORAL_CAPSULE | Freq: Every day | ORAL | Status: DC
  Administered 2017-03-15 – 2017-03-21 (×7): 180 mg via ORAL
  Filled 2017-03-14 (×8): qty 1

## 2017-03-14 MED ORDER — SENNA 8.6 MG PO TABS
2.0000 | ORAL_TABLET | Freq: Every day | ORAL | Status: DC
  Administered 2017-03-14 – 2017-03-15 (×2): 17.2 mg via ORAL
  Filled 2017-03-14 (×3): qty 2

## 2017-03-14 MED ORDER — VANCOMYCIN HCL IN DEXTROSE 750-5 MG/150ML-% IV SOLN
750.0000 mg | INTRAVENOUS | Status: DC
  Filled 2017-03-14: qty 150

## 2017-03-14 MED ORDER — TEMAZEPAM 15 MG PO CAPS
15.0000 mg | ORAL_CAPSULE | Freq: Every evening | ORAL | Status: DC | PRN
  Administered 2017-03-15 – 2017-03-17 (×2): 15 mg via ORAL
  Filled 2017-03-14 (×2): qty 1

## 2017-03-14 MED ORDER — ONDANSETRON HCL 4 MG/2ML IJ SOLN: 4.0000 mg | Freq: Four times a day (QID) | INTRAMUSCULAR | Status: DC | PRN

## 2017-03-14 NOTE — Progress Notes (Signed)
Pharmacy Antibiotic Note  Rebecca [name] is a 76 y.o. female admitted on 03/14/2017 with pneumonia.  Pharmacy has been consulted for vancomycin/Zosyn dosing.  Pt with PMH of  COPD, chronic prednisone therapy, CAD status post DES to RCA in 2006, CK D stage IV, diastolic heart failure, MGUS, hypothyroidism.Patient also has had recurrent UTIs recent urine culture from 03/04/2017 showed Pseudomonas. Patient has been on and off on Vantin. Patient also completed Ceftin which was started on 03/10/2017 by her oncologist. In the ED chest x-ray showed possible infiltrate versus atelectasis at left base. Patient empirically started on vancomycin and Zosyn for hospital acquired pneumonia  Plan:  Zosyn 3.375g IV q8h (4 hour infusion).   Vancomycin 1000 mg IV x1, then 750 mg IV q24h. Target trough 15-20 mcg/ml  Daily SCr  Monitor clinical course, renal function, cultures as available    Height: 5\' 3"  (160 cm) Weight: 139 lb (63 kg) IBW/kg (Calculated) : 52.4  Temp (24hrs), Avg:98 F (36.7 C), Min:97.8 F (36.6 C), Max:98.2 F (36.8 C)   Recent Labs Lab 03/14/17 1344 03/14/17 1359  WBC 15.5*  --   CREATININE 1.13*  --   LATICACIDVEN  --  0.55    Estimated Creatinine Clearance: 36.7 mL/min (A) (by C-G formula based on SCr of 1.13 mg/dL (H)).    Allergies  Allergen Reactions  . Levaquin [Levofloxacin] Other (See Comments)    Ruptured tendon  . Tape Other (See Comments)    PATIENT'S SKIN IS VERY, VERY THIN AND WILL TEAR AND BRUISE EASILY; PLEASE USE AN ALTERNATIVE (SOMETHING OTHER THAN TAPE)    Antimicrobials this admission: 10/1 vancomycin >>  10/1 Zosyn >>   Dose adjustments this admission: ---  Microbiology results: 10/1 BCx: sent 10/1 UCx: sent    Thank you for allowing pharmacy to be a part of this patient's care.   Royetta Asal, PharmD, BCPS Pager [phone] 03/14/2017 7:53 PM

## 2017-03-14 NOTE — Progress Notes (Signed)
Pharmacy Note    A consult was received from an ED physician for vancomycin/Zosyn per pharmacy dosing.    The patient's profile has been reviewed for ht/wt/allergies/indication/available labs.     A one time order has been placed for vancomycin 1000 mg IV x1, Zosyn 3.375 gr IV x1 .  Further antibiotics/pharmacy consults should be ordered by admitting physician if indicated.                       Thank you,   Royetta Asal, PharmD, BCPS Pager [phone] 03/14/2017 2:55 PM

## 2017-03-14 NOTE — ED Notes (Signed)
ED Provider at bedside. 

## 2017-03-14 NOTE — ED Triage Notes (Signed)
Per EMS, pt complains of worsening right hip pain x 1 week. Pt was seen last week and told she had hairline fracture in her right hip. Pt states her home pain medication is not helping with pain.

## 2017-03-14 NOTE — H&P (Signed)
TRH H&P    Patient Demographics:    Rebecca [name], is a 76 y.o. female  MRN: [id number]  DOB - January 27, 1941  Admit Date - 03/14/2017  Referring MD/NP/PA: Dr. Ellender Hose  Outpatient Primary MD for the patient is [name], MD  Patient coming from: Home  Chief Complaint  Patient presents with  . Hip Pain      HPI:    Rebecca [name]  is a 76 y.o. female, With history of COPD, chronic prednisone therapy, CAD status post DES to RCA in 2006, CK D stage IV, diastolic heart failure, MGUS, hypothyroidism came to hospital with right-sided hip pain with generalized weakness. Patient usually was able to walk with a walker but this morning she was unable to stand and daughter got concerned. Patient also has had recurrent UTIs recent urine culture from 03/04/2017 showed Pseudomonas. Patient has been on and off on Vantin. Patient also completed Ceftin which was started on 03/10/2017 by her oncologist. Patient also has been having cough. No chest pain or shortness of breath. No abdominal pain. She had some nausea and episodes of chest pain 2 days ago. Denies chest pain at this time.  In the ED chest x-ray showed possible infiltrate versus atelectasis at left base. Patient empirically started on vancomycin and Zosyn for hospital acquired pneumonia UA is clear today. X-ray of the hip bilaterally shows no acute fracture. Recently patient saw her oncologist and she ordered therapeutic ultrasound paracentesis for ascites. Patient's Plavix has been on hold in anticipation for paracentesis.   Review of systems:    In addition to the HPI above,    All other systems reviewed and are negative.   With Past History of the following :    Past Medical History:  Diagnosis Date  . AKI (acute kidney injury) ([place])   . Anemia in chronic renal disease   . Aortic atherosclerosis ([place]) 01/27/2017  . CAD (coronary artery disease) 2006   a.  s/p DES to RCA in 2006 b. low-risk NST in 2014 c. cath in 08/2015 showing patent RCA stent with nonobstructive disease  . CKD (chronic kidney disease) stage 4, GFR 15-29 ml/min ([facility])   . Complication of anesthesia   . COPD (chronic obstructive pulmonary disease) ([place])   . Diabetes mellitus   . Diastolic heart failure, NYHA class 1 ([place])   . Dyslipidemia   . Dysuria 01/06/2015  . Frozen shoulder    right  . Gastroesophageal reflux   . HAP (hospital-acquired pneumonia)   . Headache   . Hyperkalemia 01/01/2016  . Hypothyroidism   . 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 Obesity   . Pancreatic mass 01/27/2017  . Pneumonia 06/2016  . Polymyalgia rheumatica ([place])   . PONV (postoperative nausea and vomiting)   . Renal insufficiency   . Respiratory distress   . Schizoaffective disorder ([place])   . Sepsis due to pneumonia ([place])   . Shortness of breath   . Stroke [facility])       Past Surgical History:  Procedure Laterality Date  .  ABDOMINAL HYSTERECTOMY    . CARDIAC CATHETERIZATION N/A 08/13/2015   Procedure: Left Heart Cath and Coronary Angiography;  Surgeon: Jettie Booze, MD;  Location: [place] [facility];  Service: Cardiovascular;  Laterality: N/A;  . CATARACT EXTRACTION    . CHOLECYSTECTOMY    . CORONARY ANGIOPLASTY WITH STENT PLACEMENT  2006   Taxus DES to RCA, 50 % residual  . PARTIAL HYSTERECTOMY  1995      Social History:      Social History  Substance Use Topics  . Smoking status: Former Smoker    Packs/day: 1.00    Years: 35.00    Quit date: 04/14/2004  . Smokeless tobacco: Never Used  . Alcohol use No       Family History :     Family History  Problem Relation Age of Onset  . Diabetes Mother   . Heart disease Mother   . Heart disease Father   . Arthritis Father   . Healthy Sister   . Heart disease Brother   . Cancer Brother        colon ca  . Osteoporosis Sister   . Heart disease Brother   . Diabetes Brother   . Heart  disease Brother   . Diabetes Brother   . Diabetes Brother   . Healthy Brother   . Healthy Brother   . Healthy Brother   . Cancer Maternal Uncle        blood condition  . Cancer Daughter        breast ca  . Cancer Cousin        NHL      Home Medications:   Prior to Admission medications   Medication Sig Start Date End Date Taking? Authorizing Provider  aspirin 81 MG EC tablet Take 81 mg by mouth daily as needed for pain. Swallow whole.   Yes [provider]  cefUROXime (CEFTIN) 250 MG tablet Take 1 tablet (250 mg total) by mouth 2 (two) times daily with a meal. 03/07/17  Yes Gorsuch, Ni, MD  cetirizine (ZYRTEC) 10 MG tablet Take 10 mg by mouth at bedtime.    Yes [provider]  clopidogrel (PLAVIX) 75 MG tablet Take 1 tablet (75 mg total) by mouth daily with breakfast. Patient taking differently: Take 75 mg by mouth at bedtime.  10/18/13  Yes Thurnell Lose, MD  cloZAPine (CLOZARIL) 100 MG tablet Take 300 mg by mouth at bedtime. 11/04/16  Yes [provider]  Cranberry (SM CRANBERRY) 300 MG tablet Take 300 mg by mouth daily.   Yes [provider]  denosumab (PROLIA) 60 MG/ML SOLN injection Inject 60 mg into the skin every 6 (six) months. Administer in upper arm, thigh, or abdomen   Yes [provider]  diltiazem (CARDIZEM LA) 180 MG 24 hr tablet Take 180 mg by mouth daily with breakfast. 07/16/15  Yes [provider]  Fluticasone-Salmeterol (ADVAIR) 250-50 MCG/DOSE AEPB Inhale 1 puff into the lungs at bedtime.   Yes [provider]  GUAIFENESIN PO Take 400 mg by mouth 3 (three) times daily.    Yes [provider]  HYDROmorphone (DILAUDID) 2 MG tablet Take 1 tablet (2 mg total) by mouth every 4 (four) hours as needed for severe pain. 03/07/17  Yes Gorsuch, Ni, MD  ipratropium-albuterol (DUONEB) 0.5-2.5 (3) MG/3ML SOLN Inhale 3 mLs into the lungs 3 (three) times daily.  11/05/16  Yes [provider]  isosorbide  mononitrate (IMDUR) 60 MG 24 hr tablet  Take 1 tablet (60 mg total) by mouth daily. 03/14/13  Yes Isaiah Serge, NP  levothyroxine (SYNTHROID, LEVOTHROID) 75 MCG tablet Take 75 mcg by mouth daily before breakfast.   Yes [provider]  metoprolol succinate (TOPROL-XL) 100 MG 24 hr tablet Take 100 mg by mouth daily with breakfast. Take with or immediately following a meal.   Yes [provider]  Multiple Vitamins-Minerals (MULTIVITAMIN WITH MINERALS) tablet Take 1 tablet by mouth at bedtime.    Yes [provider]  nitrofurantoin (MACRODANTIN) 100 MG capsule Take 100 mg by mouth at bedtime. Filled 01/11/17 x 30 days 01/11/17  Yes [provider]  nitroGLYCERIN (NITROSTAT) 0.4 MG SL tablet Place 1 tablet (0.4 mg total) under the tongue every 5 (five) minutes as needed. For chest pain 01/11/17  Yes Strader, [place] M, PA-C  Nutritional Supplements (NUTRITIONAL SHAKE HIGH PROTEIN PO) Take 330 mLs by mouth daily as needed.   Yes [provider]  pantoprazole (PROTONIX) 40 MG tablet Take 1 tablet (40 mg total) by mouth daily. 04/03/13  Yes Hilty, Nadean Corwin, MD  polyethylene glycol powder (GLYCOLAX/MIRALAX) powder Take 17 g by mouth 2 (two) times daily. MIX AND DRINK 01/28/17  Yes Samuella Cota, MD  pravastatin (PRAVACHOL) 20 MG tablet Take 20 mg by mouth at bedtime.   Yes [provider]  predniSONE (DELTASONE) 10 MG tablet Take 1 tablet (10 mg total) by mouth daily with breakfast. 10/11/16  Yes Charlynne Cousins, MD  Probiotic Product (PROBIOTIC PO) Take 1 capsule by mouth daily.   Yes [provider]  PROCTOZONE-HC 2.5 % rectal cream U REC BID PRN 02/25/17  Yes [provider]  senna (SENOKOT) 8.6 MG TABS tablet Take 2 tablets (17.2 mg total) by mouth at bedtime. 01/28/17  Yes Samuella Cota, MD  temazepam (RESTORIL) 15 MG capsule Take 15 mg by mouth at bedtime as needed for sleep.   Yes [provider]  Tiotropium  Bromide Monohydrate (SPIRIVA RESPIMAT) 2.5 MCG/ACT AERS Inhale 2 puffs into the lungs every morning.    Yes [provider]  Vitamin D, Ergocalciferol, (DRISDOL) 50000 UNITS CAPS capsule Take 1 capsule (50,000 Units total) by mouth every 7 (seven) days. 08/27/13  Yes Kinnie Feil, MD  acetaminophen (TYLENOL) 500 MG tablet Take 1 tablet (500 mg total) by mouth every 6 (six) hours as needed (pain). Patient taking differently: Take 1,000 mg by mouth 2 (two) times daily as needed for moderate pain.  11/21/16   Arrien, Jimmy Picket, MD  furosemide (LASIX) 20 MG tablet Take 1 tablet (20 mg total) by mouth daily as needed. Patient taking differently: Take 20 mg by mouth daily as needed for fluid or edema.  12/02/16 03/02/17  Hilty, Nadean Corwin, MD  ondansetron (ZOFRAN) 4 MG tablet Take 1 tablet (4 mg total) by mouth every 8 (eight) hours as needed for nausea or vomiting. 01/16/17   Tegeler, Gwenyth Allegra, MD     Allergies:     Allergies  Allergen Reactions  . Levaquin [Levofloxacin] Other (See Comments)    Ruptured tendon  . Tape Other (See Comments)    PATIENT'S SKIN IS VERY, VERY THIN AND WILL TEAR AND BRUISE EASILY; PLEASE USE AN ALTERNATIVE (SOMETHING OTHER THAN TAPE)     Physical Exam:   Vitals  Blood pressure 136/69, pulse 73, temperature 97.8 F (36.6 C), temperature source Oral, resp. rate 12, height 5\' 3"  (1.6 m), weight 63 kg (139 lb), SpO2 95 %.  1.  General: Appears in no acute distress  2. Psychiatric:  Intact judgement and  insight, awake alert, oriented x 3.  3. Neurologic: No focal neurological deficits, all cranial nerves intact.Strength 5/5 all 4 extremities, sensation intact all 4 extremities, plantars down going.  4. Eyes :  anicteric sclerae, moist conjunctivae with no lid lag. PERRLA.  5. ENMT:  Oropharynx clear with moist mucous membranes and good dentition  6. Neck:  supple, no cervical lymphadenopathy appriciated, No thyromegaly  7. Respiratory  : Normal respiratory effort, good air movement bilaterally,clear to  auscultation bilaterally  8. Cardiovascular : RRR, no gallops, rubs or murmurs, no leg edema  9. Gastrointestinal:  Positive bowel sounds, abdomen soft, non-tender to palpation,no hepatosplenomegaly, no rigidity or guarding       10. Skin:  No cyanosis, normal texture and turgor, no rash, lesions or ulcers  11.Musculoskeletal:  Tenderness noted at the L5-S1, paravertebral muscles    Data Review:    CBC  Recent Labs Lab 03/14/17 1344  WBC 15.5*  HGB 10.3*  HCT 29.6*  PLT 230  MCV 87.6  MCH 30.5  MCHC 34.8  RDW 13.5  LYMPHSABS 1.7  MONOABS 0.9  EOSABS 0.2  BASOSABS 0.0   ------------------------------------------------------------------------------------------------------------------  Chemistries   Recent Labs Lab 03/14/17 1344  NA 130*  K 4.5  CL 94*  CO2 25  GLUCOSE 120*  BUN 31*  CREATININE 1.13*  CALCIUM 9.5  AST 14*  ALT 14  ALKPHOS 42  BILITOT 0.3   ------------------------------------------------------------------------------------------------------------------  ------------------------------------------------------------------------------------------------------------------ GFR: Estimated Creatinine Clearance: 36.7 mL/min (A) (by C-G formula based on SCr of 1.13 mg/dL (H)). Liver Function Tests:  Recent Labs Lab 03/14/17 1344  AST 14*  ALT 14  ALKPHOS 42  BILITOT 0.3  PROT 6.3*  ALBUMIN 3.4*    --------------------------------------------------------------------------------------------------------------- Urine analysis:    Component Value Date/Time   COLORURINE YELLOW 03/14/2017 1444   APPEARANCEUR HAZY (A) 03/14/2017 1444   LABSPEC 1.008 03/14/2017 1444   LABSPEC 1.005 03/07/2017 1549   PHURINE 6.0 03/14/2017 1444   GLUCOSEU NEGATIVE 03/14/2017 1444   GLUCOSEU Negative 03/07/2017 1549   HGBUR NEGATIVE 03/14/2017 1444   HGBUR negative 12/03/2008 1113    BILIRUBINUR NEGATIVE 03/14/2017 1444   BILIRUBINUR Negative 03/07/2017 1549   KETONESUR NEGATIVE 03/14/2017 1444   PROTEINUR 30 (A) 03/14/2017 1444   UROBILINOGEN 0.2 03/07/2017 1549   NITRITE NEGATIVE 03/14/2017 1444   LEUKOCYTESUR LARGE (A) 03/14/2017 1444   LEUKOCYTESUR Small 03/07/2017 1549      Imaging Results:    Dg Chest 2 View  Result Date: 03/14/2017 CLINICAL DATA:  Worsening hip pain. EXAM: CHEST  2 VIEW COMPARISON:  01/25/2017 FINDINGS: Artifact overlies the chest. The right lung is clear. There is some chronic scarring at the left base, but this may be more pronounced, suggesting some left base atelectasis or infiltrate. No measurable effusion. No acute bone finding. IMPRESSION: Chronic scarring at the left base, but possibly with some slightly worsened atelectasis or infiltrate. Electronically Signed   By: Nelson Chimes M.D.   On: 03/14/2017 14:39   Dg Hip Unilat W Or Wo Pelvis 2-3 Views Right  Result Date: 03/14/2017 CLINICAL DATA:  Recent fall. Worsening right hip pain for 1 week. Initial encounter. EXAM: DG HIP (WITH OR WITHOUT PELVIS) 2-3V RIGHT COMPARISON:  None. FINDINGS: There is no evidence of hip fracture or dislocation. There is no evidence of hip joint arthropathy or other focal bone abnormality. Mild degenerative changes are seen involving the pubic symphysis  and visualized lower lumbar spine. IMPRESSION: No acute findings. Mild degenerative changes involving pubic symphysis and lower lumbar spine. Electronically Signed   By: Earle Gell M.D.   On: 03/14/2017 14:41    My personal review of EKG: Rhythm NSR   Assessment & Plan:    Active Problems:   MGUS (monoclonal gammopathy of unknown significance)   Pneumonia   1. Healthcare associated pneumonia- we'll place under observation, start vancomycin and Zosyn per pharmacy consultation. Follow blood culture results 2. Repeated UTIs/colonization- patient has multiple rounds of antibiotics for UTIs. Last urine culture  from 03/04/2017 showed pseudomonas aeruginosa. Patient has chronically elevated WBC as she is on prednisone therapy. She is currently on IV Zosyn. I have consulted infectious disease, to clarify whether patient needs antibiotics for UTI. 3. Hip pain- likely from L5-S1 spondylosis, seen on MRI of lumbar spine in 2015. Patient's daughter was convinced that she had hairline hip fracture seen on the CT scan of the abdomen and pelvis on 01/16/2017. I called and discussed with radiologist on call and he reviewed the images of CT scan abdomen pelvis from 01/16/2017 as well as today's x-ray of the pelvis and hips. He did not find any hairline fracture or obvious fractures at this time. This was conveyed to patient's daughter. Continue Dilaudid. Will start Flexeril 5 mg by mouth 3 times a day. We'll get PT evaluation in a.m. 4. ? Ascites-patient is supposed to get paracentesis as outpatient  ordered by patient's oncologist. My physical exam did not elicit any abdominal distention. Patient is scheduled to have ultrasound abdomen with paracentesis as outpatient. 5. CAD-continue aspirin, Plavix is currently on hold, in anticipation for paracentesis.  6. COPD-stable, continue Motrin when necessary. Continue prednisone 10 mg by mouth daily 7. MGUS-followed by oncology as outpatient 8. Hypertension-blood pressure stable, continue metoprolol, Cardizem.    DVT Prophylaxis-   Lovenox   AM Labs Ordered, also please review Full Orders  Family Communication: Admission, patients condition and plan of care including tests being ordered have been discussed with the patient  who indicate understanding and agree with the plan and Code Status.  Code Status:  DO NOT RESUSCITATE  Admission status: Observation    Time spent in minutes : 60 minutes   Odette Watanabe S M.D on 03/14/2017 at 5:16 PM  Between 7am to 7pm - Pager - [phone]. After 7pm go to [email or web address] - password [facility]  [facility] - Office   [phone]

## 2017-03-14 NOTE — ED Notes (Signed)
Patient transported to X-ray 

## 2017-03-14 NOTE — ED Provider Notes (Signed)
[facility] DEPT Provider Note   CSN: [id number] Arrival date & time: 03/14/17  1242     History   Chief Complaint Chief Complaint  Patient presents with  . Hip Pain    HPI Rebecca [name] is a 76 y.o. female.  HPI   76 year old female with extensive past medical history as below including COPD, coronary disease, chronic prednisone use, who presents with right hip pain, cough, and generalized fatigue. The patient reportedly has been battling a recurrent urinary infection. Urine culture sent from the office showed pseudomonas. She has been on and off Vantin for this. She has a chronic leukocytosis B CV has been following and reportedly it decreases when she is on Vantin. Over the last several days, however, the patient has become increasingly weak. She is currently on Vantin. She started to develop a harsh, productive cough with left shoulder and left back pain over the last 24 hours. She was unable to walk today due to generalized weakness. She says when presents for evaluation. Denies any fevers or chills. History is provided by the patient as well as her daughter, who is present in the emergency department.  Past Medical History:  Diagnosis Date  . AKI (acute kidney injury) ([place])   . Anemia in chronic renal disease   . Aortic atherosclerosis ([place]) 01/27/2017  . CAD (coronary artery disease) 2006   a. s/p DES to RCA in 2006 b. low-risk NST in 2014 c. cath in 08/2015 showing patent RCA stent with nonobstructive disease  . CKD (chronic kidney disease) stage 4, GFR 15-29 ml/min (HCC)   . Complication of anesthesia   . COPD (chronic obstructive pulmonary disease) ([place])   . Diabetes mellitus   . Diastolic heart failure, NYHA class 1 ([place])   . Dyslipidemia   . Dysuria 01/06/2015  . Frozen shoulder    right  . Gastroesophageal reflux   . HAP (hospital-acquired pneumonia)   . Headache   . Hyperkalemia 01/01/2016  . Hypothyroidism   . MGUS (monoclonal gammopathy of unknown  significance) 01/28/2014  . Obesity   . Pancreatic mass 01/27/2017  . Pneumonia 06/2016  . Polymyalgia rheumatica ([place])   . PONV (postoperative nausea and vomiting)   . Renal insufficiency   . Respiratory distress   . Schizoaffective disorder ([place])   . Sepsis due to pneumonia ([place])   . Shortness of breath   . Stroke [facility])     Patient Active Problem List   Diagnosis Date Noted  . Acute right hip pain 03/14/2017  . Physical debility 03/08/2017  . Recurrent UTI 03/08/2017  . Ascites 03/08/2017  . Chronic pain disorder 03/07/2017  . Chronic constipation   . Community acquired pneumonia   . Palliative care encounter   . Encounter for hospice care discussion   . TIA (transient ischemic attack) 01/27/2017  . Pancreatic mass 01/27/2017  . Aortic atherosclerosis ([place]) 01/27/2017  . Hyponatremia 01/25/2017  . Generalized weakness 01/25/2017  . Fatigue 01/11/2017  . Mixed hyperlipidemia 12/02/2016  . Encephalopathy 11/20/2016  . Altered mental status 11/19/2016  . Aspiration pneumonia of both lower lobes due to gastric secretions ([place]) 10/10/2016  . Pneumonia 10/07/2016  . Lobar pneumonia, unspecified organism ([place]) 10/07/2016  . Ileus ([place]) 10/07/2016  . Healthcare-associated pneumonia 09/12/2016  . AKI (acute kidney injury) ([place]) 09/12/2016  . HAP (hospital-acquired pneumonia) 09/12/2016  . Respiratory distress 09/12/2016  . Pressure injury of skin 07/15/2016  . Essential hypertension 07/14/2016  . Acute encephalopathy 07/14/2016  . Coronary artery disease  involving native coronary artery of native heart without angina pectoris 05/14/2016  . CKD stage 4, GFR 15-29 ml/min  08/11/2015  . 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 Leukocytosis 01/28/2014  . History of TIA - May 2015- Plavix 10/16/2013  . Polymyalgia rheumatica ([place]) 10/03/2012  . Chronic diastolic (congestive) heart failure ([place]) 07/15/2012  . Trochanteric bursitis of right hip 04/11/2012   . Dysuria 08/30/2011  . History of small bowel obstruction 11/04/2010  . Schizoaffective disorder ([place]) 01/10/2008  . Dyslipidemia 01/25/2007  . Hypothyroidism 08/11/2006  . Anemia in chronic renal disease 08/11/2006  . GASTROESOPHAGEAL REFLUX, NO ESOPHAGITIS 08/11/2006    Past Surgical History:  Procedure Laterality Date  . ABDOMINAL HYSTERECTOMY    . CARDIAC CATHETERIZATION N/A 08/13/2015   Procedure: Left Heart Cath and Coronary Angiography;  Surgeon: Jettie Booze, MD;  Location: [facility];  Service: Cardiovascular;  Laterality: N/A;  . CATARACT EXTRACTION    . CHOLECYSTECTOMY    . CORONARY ANGIOPLASTY WITH STENT PLACEMENT  2006   Taxus DES to RCA, 50 % residual  . PARTIAL HYSTERECTOMY  1995    OB History    No data available       Home Medications    Prior to Admission medications   Medication Sig Start Date End Date Taking? Authorizing Provider  aspirin 81 MG EC tablet Take 81 mg by mouth daily as needed for pain. Swallow whole.   Yes [provider]  cefUROXime (CEFTIN) 250 MG tablet Take 1 tablet (250 mg total) by mouth 2 (two) times daily with a meal. 03/07/17  Yes Gorsuch, Ni, MD  cetirizine (ZYRTEC) 10 MG tablet Take 10 mg by mouth at bedtime.    Yes [provider]  clopidogrel (PLAVIX) 75 MG tablet Take 1 tablet (75 mg total) by mouth daily with breakfast. Patient taking differently: Take 75 mg by mouth at bedtime.  10/18/13  Yes Thurnell Lose, MD  cloZAPine (CLOZARIL) 100 MG tablet Take 300 mg by mouth at bedtime. 11/04/16  Yes [provider]  Cranberry (SM CRANBERRY) 300 MG tablet Take 300 mg by mouth daily.   Yes [provider]  denosumab (PROLIA) 60 MG/ML SOLN injection Inject 60 mg into the skin every 6 (six) months. Administer in upper arm, thigh, or abdomen   Yes [provider]  diltiazem (CARDIZEM LA) 180 MG 24 hr tablet Take 180 mg by mouth daily with breakfast. 07/16/15  Yes [provider]  Fluticasone-Salmeterol (ADVAIR) 250-50 MCG/DOSE AEPB Inhale 1 puff into the lungs at bedtime.   Yes [provider]  GUAIFENESIN PO Take 400 mg by mouth 3 (three) times daily.    Yes [provider]  HYDROmorphone (DILAUDID) 2 MG tablet Take 1 tablet (2 mg total) by mouth every 4 (four) hours as needed for severe pain. 03/07/17  Yes Gorsuch, Ni, MD  ipratropium-albuterol (DUONEB) 0.5-2.5 (3) MG/3ML SOLN Inhale 3 mLs into the lungs 3 (three) times daily.  11/05/16  Yes [provider]  isosorbide mononitrate (IMDUR) 60 MG 24 hr tablet Take 1 tablet (60 mg total) by mouth daily. 03/14/13  Yes Isaiah Serge, NP  levothyroxine (SYNTHROID, LEVOTHROID) 75 MCG tablet Take 75 mcg by mouth daily before breakfast.   Yes [provider]  metoprolol succinate (TOPROL-XL) 100 MG 24 hr tablet Take 100 mg by mouth daily with breakfast. Take with or immediately following a meal.   Yes [provider]  Multiple Vitamins-Minerals (MULTIVITAMIN  WITH MINERALS) tablet Take 1 tablet by mouth at bedtime.    Yes [provider]  nitrofurantoin (MACRODANTIN) 100 MG capsule Take 100 mg by mouth at bedtime. Filled 01/11/17 x 30 days 01/11/17  Yes [provider]  nitroGLYCERIN (NITROSTAT) 0.4 MG SL tablet Place 1 tablet (0.4 mg total) under the tongue every 5 (five) minutes as needed. For chest pain 01/11/17  Yes Strader, [place] M, PA-C  Nutritional Supplements (NUTRITIONAL SHAKE HIGH PROTEIN PO) Take 330 mLs by mouth daily as needed.   Yes [provider]  pantoprazole (PROTONIX) 40 MG tablet Take 1 tablet (40 mg total) by mouth daily. 04/03/13  Yes Hilty, Nadean Corwin, MD  polyethylene glycol powder (GLYCOLAX/MIRALAX) powder Take 17 g by mouth 2 (two) times daily. MIX AND DRINK 01/28/17  Yes Samuella Cota, MD  pravastatin (PRAVACHOL) 20 MG tablet Take 20 mg by mouth at bedtime.   Yes [provider]  predniSONE (DELTASONE) 10 MG  tablet Take 1 tablet (10 mg total) by mouth daily with breakfast. 10/11/16  Yes Charlynne Cousins, MD  Probiotic Product (PROBIOTIC PO) Take 1 capsule by mouth daily.   Yes [provider]  PROCTOZONE-HC 2.5 % rectal cream U REC BID PRN 02/25/17  Yes [provider]  senna (SENOKOT) 8.6 MG TABS tablet Take 2 tablets (17.2 mg total) by mouth at bedtime. 01/28/17  Yes Samuella Cota, MD  temazepam (RESTORIL) 15 MG capsule Take 15 mg by mouth at bedtime as needed for sleep.   Yes [provider]  Tiotropium Bromide Monohydrate (SPIRIVA RESPIMAT) 2.5 MCG/ACT AERS Inhale 2 puffs into the lungs every morning.    Yes [provider]  Vitamin D, Ergocalciferol, (DRISDOL) 50000 UNITS CAPS capsule Take 1 capsule (50,000 Units total) by mouth every 7 (seven) days. 08/27/13  Yes Kinnie Feil, MD  acetaminophen (TYLENOL) 500 MG tablet Take 1 tablet (500 mg total) by mouth every 6 (six) hours as needed (pain). Patient taking differently: Take 1,000 mg by mouth 2 (two) times daily as needed for moderate pain.  11/21/16   Arrien, Jimmy Picket, MD  furosemide (LASIX) 20 MG tablet Take 1 tablet (20 mg total) by mouth daily as needed. Patient taking differently: Take 20 mg by mouth daily as needed for fluid or edema.  12/02/16 03/02/17  Hilty, Nadean Corwin, MD  ondansetron (ZOFRAN) 4 MG tablet Take 1 tablet (4 mg total) by mouth every 8 (eight) hours as needed for nausea or vomiting. 01/16/17   Tegeler, Gwenyth Allegra, MD    Family History Family History  Problem Relation Age of Onset  . Diabetes Mother   . Heart disease Mother   . Heart disease Father   . Arthritis Father   . Healthy Sister   . Heart disease Brother   . Cancer Brother        colon ca  . Osteoporosis Sister   . Heart disease Brother   . Diabetes Brother   . Heart disease Brother   . Diabetes Brother   . Diabetes Brother   . Healthy Brother   . Healthy Brother   . Healthy Brother   . Cancer  Maternal Uncle        blood condition  . Cancer Daughter        breast ca  . Cancer Cousin        NHL    Social History Social History  Substance Use Topics  . Smoking status: Former Smoker  Packs/day: 1.00    Years: 35.00    Quit date: 04/14/2004  . Smokeless tobacco: Never Used  . Alcohol use No     Allergies   Levaquin [levofloxacin] and Tape   Review of Systems Review of Systems  Constitutional: Positive for chills and fatigue. Negative for fever.  Cardiovascular: Positive for chest pain.  Musculoskeletal: Positive for arthralgias and gait problem.  All other systems reviewed and are negative.    Physical Exam Updated Vital Signs BP (!) 175/76 (BP Location: Right Arm)   Pulse 77   Temp 98.2 F (36.8 C) (Oral)   Resp 20   Ht 5\' 3"  (1.6 m)   Wt 63 kg (139 lb)   SpO2 100%   BMI 24.62 kg/m   Physical Exam  Constitutional: She is oriented to person, place, and time. She appears well-developed and well-nourished. No distress.  HENT:  Head: Normocephalic and atraumatic.  Mildly dry mucous membranes  Eyes: Conjunctivae are normal.  Neck: Neck supple.  Cardiovascular: Normal rate, regular rhythm and normal heart sounds.  Exam reveals no friction rub.   No murmur heard. Pulmonary/Chest: Effort normal. No respiratory distress. She has no wheezes. She has rales.  Rhonchi and rales throughout left lower lobe  Abdominal: Soft. She exhibits no distension.  Musculoskeletal: She exhibits tenderness (Exquisite tenderness to palpation over right greater trochanter and proximal thigh. No deformity or bruising.). She exhibits no edema.  Neurological: She is alert and oriented to person, place, and time. She exhibits normal muscle tone.  Skin: Skin is warm. Capillary refill takes less than 2 seconds.  Psychiatric: She has a normal mood and affect.  Nursing note and vitals reviewed.    ED Treatments / Results  Labs (all labs ordered are listed, but only abnormal  results are displayed) Labs Reviewed  CBC WITH DIFFERENTIAL/PLATELET - Abnormal; Notable for the following:       Result Value   WBC 15.5 (*)    RBC 3.38 (*)    Hemoglobin 10.3 (*)    HCT 29.6 (*)    Neutro Abs 12.8 (*)    All other components within normal limits  COMPREHENSIVE METABOLIC PANEL - Abnormal; Notable for the following:    Sodium 130 (*)    Chloride 94 (*)    Glucose, Bld 120 (*)    BUN 31 (*)    Creatinine, Ser 1.13 (*)    Total Protein 6.3 (*)    Albumin 3.4 (*)    AST 14 (*)    GFR calc non Af Amer 45 (*)    GFR calc Af Amer 53 (*)    All other components within normal limits  URINALYSIS, ROUTINE W REFLEX MICROSCOPIC - Abnormal; Notable for the following:    APPearance HAZY (*)    Protein, ur 30 (*)    Leukocytes, UA LARGE (*)    Bacteria, UA FEW (*)    Squamous Epithelial / LPF 0-5 (*)    All other components within normal limits  CULTURE, BLOOD (ROUTINE X 2)  CULTURE, BLOOD (ROUTINE X 2)  URINE CULTURE  CBC  COMPREHENSIVE METABOLIC PANEL  I-STAT CG4 LACTIC ACID, ED  I-STAT TROPONIN, ED    EKG  EKG Interpretation  Date/Time:  Monday March 14 2017 13:42:08 EDT Ventricular Rate:  77 PR Interval:    QRS Duration: 92 QT Interval:  364 QTC Calculation: 412 R Axis:   30 Text Interpretation:  Sinus rhythm Nonspecific T abnormalities, lateral leads When compared with ECG of  01/25/2017, No significant change was found Confirmed by Delora Fuel ([id number]) on 03/14/2017 4:14:23 PM       Radiology Dg Chest 2 View  Result Date: 03/14/2017 CLINICAL DATA:  Worsening hip pain. EXAM: CHEST  2 VIEW COMPARISON:  01/25/2017 FINDINGS: Artifact overlies the chest. The right lung is clear. There is some chronic scarring at the left base, but this may be more pronounced, suggesting some left base atelectasis or infiltrate. No measurable effusion. No acute bone finding. IMPRESSION: Chronic scarring at the left base, but possibly with some slightly worsened atelectasis or  infiltrate. Electronically Signed   By: Nelson Chimes M.D.   On: 03/14/2017 14:39   Dg Hip Unilat W Or Wo Pelvis 2-3 Views Right  Result Date: 03/14/2017 CLINICAL DATA:  Recent fall. Worsening right hip pain for 1 week. Initial encounter. EXAM: DG HIP (WITH OR WITHOUT PELVIS) 2-3V RIGHT COMPARISON:  None. FINDINGS: There is no evidence of hip fracture or dislocation. There is no evidence of hip joint arthropathy or other focal bone abnormality. Mild degenerative changes are seen involving the pubic symphysis and visualized lower lumbar spine. IMPRESSION: No acute findings. Mild degenerative changes involving pubic symphysis and lower lumbar spine. Electronically Signed   By: Earle Gell M.D.   On: 03/14/2017 14:41    Procedures Procedures (including critical care time)  Medications Ordered in ED Medications  aspirin EC tablet 81 mg (not administered)  temazepam (RESTORIL) capsule 15 mg (not administered)  HYDROmorphone (DILAUDID) tablet 2 mg (2 mg Oral Given 03/14/17 1841)  guaiFENesin (MUCINEX) 12 hr tablet 600 mg (not administered)  polyethylene glycol (MIRALAX / GLYCOLAX) packet 17 g (not administered)  senna (SENOKOT) tablet 17.2 mg (not administered)  acetaminophen (TYLENOL) tablet 500 mg (not administered)  cloZAPine (CLOZARIL) tablet 300 mg (not administered)  ipratropium-albuterol (DUONEB) 0.5-2.5 (3) MG/3ML nebulizer solution 3 mL (not administered)  predniSONE (DELTASONE) tablet 10 mg (not administered)  levothyroxine (SYNTHROID, LEVOTHROID) tablet 75 mcg (not administered)  mometasone-formoterol (DULERA) 200-5 MCG/ACT inhaler 2 puff (not administered)  pravastatin (PRAVACHOL) tablet 20 mg (not administered)  metoprolol succinate (TOPROL-XL) 24 hr tablet 100 mg (not administered)  pantoprazole (PROTONIX) EC tablet 40 mg (not administered)  isosorbide mononitrate (IMDUR) 24 hr tablet 60 mg (not administered)  loratadine (CLARITIN) tablet 10 mg (not administered)  enoxaparin  (LOVENOX) injection 40 mg (not administered)  sodium chloride flush (NS) 0.9 % injection 3 mL (not administered)  sodium chloride flush (NS) 0.9 % injection 3 mL (not administered)  0.9 %  sodium chloride infusion (not administered)  ondansetron (ZOFRAN) tablet 4 mg (not administered)    Or  ondansetron (ZOFRAN) injection 4 mg (not administered)  piperacillin-tazobactam (ZOSYN) IVPB 3.375 g (not administered)  vancomycin (VANCOCIN) IVPB 750 mg/150 ml premix (not administered)  diltiazem (CARDIZEM CD) 24 hr capsule 180 mg (not administered)  vancomycin (VANCOCIN) IVPB 1000 mg/200 mL premix (0 mg Intravenous Stopped 03/14/17 1607)  piperacillin-tazobactam (ZOSYN) IVPB 3.375 g (0 g Intravenous Stopped 03/14/17 1851)  ipratropium-albuterol (DUONEB) 0.5-2.5 (3) MG/3ML nebulizer solution 3 mL (3 mLs Nebulization Given 03/14/17 1531)  hydrocortisone sodium succinate (SOLU-CORTEF) 100 MG injection 100 mg (100 mg Intravenous Given 03/14/17 1531)     Initial Impression / Assessment and Plan / ED Course  I have reviewed the triage vital signs and the nursing notes.  Pertinent labs & imaging results that were available during my care of the patient were reviewed by me and considered in my medical decision making (see chart for details).  76 yo F here with worsening left-sided CP and cough, weakness, and fatigue In setting of recent UTI. Pt also with worsening R hip pain. Labs, imaging is c/f new LLL PNA, which is c/w pt's cough, worsening sx, and pleuritic pain in this area. She is also noted to have persistent UTI though this should have been treated based on PSA cx on 9/24. Will admit for generalized weakness 2/2 PNA.  Of note, I am concerned about pt's persistent leukocytosis and hip pain. While this may be referred from her bladder, she is immune suppressed and has chronic leukocytosis and hip pain that seems to improve intermittently on ABX therapy - may benefit from MR to eval for osteo, septic  arthritis.  Final Clinical Impressions(s) / ED Diagnoses   Final diagnoses:  HCAP (healthcare-associated pneumonia)  Lower urinary tract infectious disease  Pain of right hip joint    New Prescriptions Current Discharge Medication List       Duffy Bruce, MD 03/14/17 2120

## 2017-03-15 ENCOUNTER — Observation Stay (HOSPITAL_COMMUNITY): Payer: Medicare Other

## 2017-03-15 ENCOUNTER — Encounter (HOSPITAL_COMMUNITY): Payer: Self-pay | Admitting: Radiology

## 2017-03-15 ENCOUNTER — Ambulatory Visit (HOSPITAL_COMMUNITY): Payer: Medicare Other | Attending: Hematology and Oncology

## 2017-03-15 DIAGNOSIS — B965 Pseudomonas (aeruginosa) (mallei) (pseudomallei) as the cause of diseases classified elsewhere: Secondary | ICD-10-CM | POA: Diagnosis not present

## 2017-03-15 DIAGNOSIS — N390 Urinary tract infection, site not specified: Secondary | ICD-10-CM

## 2017-03-15 DIAGNOSIS — M25551 Pain in right hip: Secondary | ICD-10-CM | POA: Diagnosis not present

## 2017-03-15 DIAGNOSIS — J449 Chronic obstructive pulmonary disease, unspecified: Secondary | ICD-10-CM | POA: Diagnosis not present

## 2017-03-15 DIAGNOSIS — M25559 Pain in unspecified hip: Secondary | ICD-10-CM

## 2017-03-15 DIAGNOSIS — D72829 Elevated white blood cell count, unspecified: Secondary | ICD-10-CM

## 2017-03-15 DIAGNOSIS — D472 Monoclonal gammopathy: Secondary | ICD-10-CM | POA: Diagnosis not present

## 2017-03-15 LAB — CBC
HCT: 27.6 % — ABNORMAL LOW (ref 36.0–46.0)
HEMOGLOBIN: 9.5 g/dL — AB (ref 12.0–15.0)
MCH: 29.9 pg (ref 26.0–34.0)
MCHC: 34.4 g/dL (ref 30.0–36.0)
MCV: 86.8 fL (ref 78.0–100.0)
PLATELETS: 220 10*3/uL (ref 150–400)
RBC: 3.18 MIL/uL — AB (ref 3.87–5.11)
RDW: 13.4 % (ref 11.5–15.5)
WBC: 13.4 10*3/uL — ABNORMAL HIGH (ref 4.0–10.5)

## 2017-03-15 LAB — COMPREHENSIVE METABOLIC PANEL
ALBUMIN: 3 g/dL — AB (ref 3.5–5.0)
ALK PHOS: 39 U/L (ref 38–126)
ALT: 13 U/L — ABNORMAL LOW (ref 14–54)
ANION GAP: 8 (ref 5–15)
AST: 14 U/L — ABNORMAL LOW (ref 15–41)
BUN: 31 mg/dL — ABNORMAL HIGH (ref 6–20)
CHLORIDE: 96 mmol/L — AB (ref 101–111)
CO2: 26 mmol/L (ref 22–32)
Calcium: 9.2 mg/dL (ref 8.9–10.3)
Creatinine, Ser: 1.17 mg/dL — ABNORMAL HIGH (ref 0.44–1.00)
GFR calc non Af Amer: 43 mL/min — ABNORMAL LOW (ref >60)
GFR, EST AFRICAN AMERICAN: 50 mL/min — AB (ref >60)
GLUCOSE: 115 mg/dL — AB (ref 65–99)
Potassium: 4.8 mmol/L (ref 3.5–5.1)
SODIUM: 130 mmol/L — AB (ref 135–145)
Total Bilirubin: 0.4 mg/dL (ref 0.3–1.2)
Total Protein: 5.8 g/dL — ABNORMAL LOW (ref 6.5–8.1)

## 2017-03-15 MED ORDER — CLOPIDOGREL BISULFATE 75 MG PO TABS
75.0000 mg | ORAL_TABLET | Freq: Every day | ORAL | Status: DC
  Administered 2017-03-16 – 2017-03-26 (×10): 75 mg via ORAL
  Filled 2017-03-15 (×11): qty 1

## 2017-03-15 MED ORDER — TIOTROPIUM BROMIDE MONOHYDRATE 18 MCG IN CAPS
18.0000 ug | ORAL_CAPSULE | Freq: Every day | RESPIRATORY_TRACT | Status: DC
  Administered 2017-03-16 – 2017-03-20 (×4): 18 ug via RESPIRATORY_TRACT
  Filled 2017-03-15: qty 5

## 2017-03-15 MED ORDER — IPRATROPIUM-ALBUTEROL 0.5-2.5 (3) MG/3ML IN SOLN
3.0000 mL | RESPIRATORY_TRACT | Status: DC | PRN
  Administered 2017-03-21 (×2): 3 mL via RESPIRATORY_TRACT
  Filled 2017-03-15 (×2): qty 3

## 2017-03-15 MED ORDER — LIDOCAINE HCL 2 % IJ SOLN
INTRAMUSCULAR | Status: AC
  Filled 2017-03-15: qty 10

## 2017-03-15 MED ORDER — PREMIER PROTEIN SHAKE
2.0000 [oz_av] | Freq: Every day | ORAL | Status: DC | PRN
  Filled 2017-03-15: qty 325.31

## 2017-03-15 MED ORDER — GABAPENTIN 100 MG PO CAPS
100.0000 mg | ORAL_CAPSULE | Freq: Three times a day (TID) | ORAL | Status: DC
  Administered 2017-03-15 – 2017-03-22 (×19): 100 mg via ORAL
  Filled 2017-03-15 (×20): qty 1

## 2017-03-15 MED ORDER — TIOTROPIUM BROMIDE MONOHYDRATE 2.5 MCG/ACT IN AERS: 2.0000 | INHALATION_SPRAY | Freq: Every morning | RESPIRATORY_TRACT | Status: DC

## 2017-03-15 MED ORDER — HYDROMORPHONE HCL-NACL 0.5-0.9 MG/ML-% IV SOSY
0.5000 mg | PREFILLED_SYRINGE | INTRAVENOUS | Status: DC | PRN
  Administered 2017-03-15 – 2017-03-18 (×9): 0.5 mg via INTRAVENOUS
  Filled 2017-03-15 (×9): qty 1

## 2017-03-15 MED ORDER — IPRATROPIUM-ALBUTEROL 0.5-2.5 (3) MG/3ML IN SOLN
3.0000 mL | Freq: Two times a day (BID) | RESPIRATORY_TRACT | Status: DC
  Administered 2017-03-16 – 2017-03-17 (×3): 3 mL via RESPIRATORY_TRACT
  Filled 2017-03-15 (×4): qty 3

## 2017-03-15 MED ORDER — NUTRITIONAL SHAKE HIGH PROTEIN PO LIQD: Freq: Every day | ORAL | Status: DC | PRN

## 2017-03-15 NOTE — Consult Note (Signed)
[facility] for Infectious Disease       Reason for Consult: ? infection    Referring Physician: Dr. Jeronimo Norma  Active Problems:   MGUS (monoclonal gammopathy of unknown significance)   Pneumonia   . cloZAPine  300 mg Oral QHS  . diltiazem  180 mg Oral Daily  . enoxaparin (LOVENOX) injection  40 mg Subcutaneous Q24H  . gabapentin  100 mg Oral TID  . guaiFENesin  600 mg Oral BID  . ipratropium-albuterol  3 mL Inhalation TID  . isosorbide mononitrate  60 mg Oral Daily  . levothyroxine  75 mcg Oral QAC breakfast  . loratadine  10 mg Oral Daily  . metoprolol succinate  100 mg Oral Q breakfast  . mometasone-formoterol  2 puff Inhalation BID  . pantoprazole  40 mg Oral Daily  . polyethylene glycol  17 g Oral BID  . pravastatin  20 mg Oral QHS  . predniSONE  10 mg Oral Q breakfast  . senna  2 tablet Oral QHS  . sodium chloride flush  3 mL Intravenous Q12H    Recommendations: Stop antibiotics   Assessment: She has a cough, but no fever, a baseline WBC, no hypoxia and no SOB to be concerned about pneumonia.  CXR noted and personally reviewed and appears c/w atelectasis.  She has underlying COPD. UTI - currently is asymptomatic so no indication to treat.  She has no dysuria, a baseline peripheral WBCs.  Culture done and again is growing Pseudomonas.  I suspect she has interstitial cystitis rather than recurrent UTIs.  She is clearly colonized with Pseudomonas which does not require treatment.   Hip pain - has been on going.  CT done and no signs of infection, some mild degenerative changes.    Antibiotics: Vancomycin and zosyn  HPI: Rebecca [name] is a 76 y.o. female with multiple medical issues including COPD on chronic prednisone, chronic leukocytosis with a baseline 14-21,000, recurrent UTIs who came in with weakness when her daughter was concerned about her when she got up.  She recently was on ceftin for a UTI by [name] who found her to be symptomatic and culture  grew Pseudomonas.  The patient currently is asymptomatic as above.  She is having continued pain in her right hip and CT noted above, no signs of acute pathology.   CXR independently reviewed as mentioned above  Review of Systems:  Constitutional: negative for fevers, chills and anorexia Respiratory: positive for cough or sputum, negative for hemoptysis, wheezing or dyspnea on exertion Gastrointestinal: negative for diarrhea Integument/breast: negative for rash and arms with echymosis All other systems reviewed and are negative    Past Medical History:  Diagnosis Date  . AKI (acute kidney injury) ([place])   . Anemia in chronic renal disease   . Aortic atherosclerosis ([place]) 01/27/2017  . CAD (coronary artery disease) 2006   a. s/p DES to RCA in 2006 b. low-risk NST in 2014 c. cath in 08/2015 showing patent RCA stent with nonobstructive disease  . CKD (chronic kidney disease) stage 4, GFR 15-29 ml/min (HCC)   . Complication of anesthesia   . COPD (chronic obstructive pulmonary disease) ([place])   . Diabetes mellitus   . Diastolic heart failure, NYHA class 1 ([place])   . Dyslipidemia   . Dysuria 01/06/2015  . Frozen shoulder    right  . Gastroesophageal reflux   . HAP (hospital-acquired pneumonia)   . Headache   . Hyperkalemia 01/01/2016  . Hypothyroidism   .  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 Obesity   . Pancreatic mass 01/27/2017  . Pneumonia 06/2016  . Polymyalgia rheumatica ([place])   . PONV (postoperative nausea and vomiting)   . Renal insufficiency   . Respiratory distress   . Schizoaffective disorder ([place])   . Sepsis due to pneumonia ([place])   . Shortness of breath   . Stroke [facility])     Social History  Substance Use Topics  . Smoking status: Former Smoker    Packs/day: 1.00    Years: 35.00    Quit date: 04/14/2004  . Smokeless tobacco: Never Used  . Alcohol use No    Family History  Problem Relation Age of Onset  . Diabetes Mother   . Heart  disease Mother   . Heart disease Father   . Arthritis Father   . Healthy Sister   . Heart disease Brother   . Cancer Brother        colon ca  . Osteoporosis Sister   . Heart disease Brother   . Diabetes Brother   . Heart disease Brother   . Diabetes Brother   . Diabetes Brother   . Healthy Brother   . Healthy Brother   . Healthy Brother   . Cancer Maternal Uncle        blood condition  . Cancer Daughter        breast ca  . Cancer Cousin        NHL    Allergies  Allergen Reactions  . Levaquin [Levofloxacin] Other (See Comments)    Ruptured tendon  . Tape Other (See Comments)    PATIENT'S SKIN IS VERY, VERY THIN AND WILL TEAR AND BRUISE EASILY; PLEASE USE AN ALTERNATIVE (SOMETHING OTHER THAN TAPE)    Physical Exam: Constitutional: in no apparent distress and alert  Vitals:   03/15/17 0453 03/15/17 0912  BP: (!) 149/72   Pulse: 75   Resp: 19   Temp: 97.9 F (36.6 C)   SpO2: 99% 96%   EYES: anicteric ENMT:no thrush Cardiovascular: Cor RRR Respiratory: CTA B; normal respiratory effort GI: Bowel sounds are normal, liver is not enlarged, spleen is not enlarged Musculoskeletal: no pedal edema noted Skin: negatives: no rash, bilateral upper extremity echymosis Hematologic: no cervical lad  Lab Results  Component Value Date   WBC 13.4 (H) 03/15/2017   HGB 9.5 (L) 03/15/2017   HCT 27.6 (L) 03/15/2017   MCV 86.8 03/15/2017   PLT 220 03/15/2017    Lab Results  Component Value Date   CREATININE 1.17 (H) 03/15/2017   BUN 31 (H) 03/15/2017   NA 130 (L) 03/15/2017   K 4.8 03/15/2017   CL 96 (L) 03/15/2017   CO2 26 03/15/2017    Lab Results  Component Value Date   ALT 13 (L) 03/15/2017   AST 14 (L) 03/15/2017   ALKPHOS 39 03/15/2017     Microbiology: Recent Results (from the past 240 hour(s))  Urine Culture     Status: Abnormal   Collection Time: 03/07/17  3:49 PM  Result Value Ref Range Status   Urine Culture, Routine Final report (A)  Final   Organism  ID, Bacteria Comment (A)  Final    Comment: Pseudomonas aeruginosa Greater than 100,000 colony forming units per mL    Antimicrobial Susceptibility Comment  Final    Comment:       ** S = Susceptible; I = Intermediate; R = Resistant **  P = Positive; N = Negative             MICS are expressed in micrograms per mL    Antibiotic                 RSLT#1    RSLT#2    RSLT#3    RSLT#4 Amikacin                       S Cefepime                       S Ceftazidime                    S Ciprofloxacin                  S Gentamicin                     S Imipenem                       S Levofloxacin                   S Meropenem                      S Piperacillin                   S Ticarcillin                    R Tobramycin                     Darrol Angel, [place] for Infectious Disease [place] [email or web address] [phone] pager  [phone] cell 03/15/2017, 2:24 PM

## 2017-03-15 NOTE — Progress Notes (Signed)
[facility] Progress Note  Patient: Rebecca [name] LGX:[id number]   PCP: [name], MD DOB: 1940-12-12   DOA: 03/14/2017   DOS: 03/15/2017   Date of Service: the patient was seen and examined on 03/15/2017  Subjective: feeling better after getting IV dilaudid.  Pt denies any fever, chills, headache, cough, rash anywhere, chest pain,  shortness of breath, pedal edema, nausea, vomiting, diarrhea, constipation, abdominal pain, burning urination, increase urinary frequency, fall, trauma or injury, dizziness, difficulty swallowing, focal neurological deficit.   Brief hospital course: Pt. with PMH of COPD, chronic prednisone therapy, CAD status post DES to RCA in 2006, CKD stage IV, diastolic heart failure, MGUS, hypothyroidism ; admitted on 03/14/2017, presented with complaint of right hip pain, was found to have muscular pain. Currently further plan is pain control.  Assessment and Plan: 1. Healthcare associated pneumonia-RULED OUT ID consulted and appreciate input. Stop vancomycin and Zosyn per ID. Follow blood culture results  2. Repeated UTIs/colonization-  patient has multiple rounds of antibiotics for UTIs. Last urine culture from 03/04/2017 showed pseudomonas aeruginosa. Patient has chronically elevated WBC as she is on prednisone therapy.  Urine this time also shows gram negative rods, concerning for pseudomonas. Appreciate ID input, more likely colonizer rather then true infection. Stop and monitor off of Antibiotics .  3. Hip pain-  likely combination from L5-S1 spondylosis, seen on MRI of lumbar spine in 2015 and muscular pain in gluteal region. Patient's daughter was convinced that she had hairline hip fracture seen on the CT scan of the abdomen and pelvis on 01/16/2017.  Continue Dilaudid. Will start Flexeril 5 mg by mouth 3 times a day. We'll get PT evaluation in a.m. Add gabapentin.  Pt scheduled to see interventional pain mx as an outpatient for back pain and will benefit  for spinal injection.   4. Concern for Ascites-RULED OUT patient is supposed to get paracentesis as outpatient  ordered by patient's oncologist. My physical exam did not elicit any abdominal distention. Patient is scheduled to have ultrasound abdomen with paracentesis as outpatient on 10/02, daughter insisted to get it done in hospital and no ascites seen on US abdomen  5. CAD continue aspirin, Plavix is currently on hold, in anticipation for paracentesis.  Resumed, told daughter, no need to stop plavix for paracentesis but if going for spinal injection, it will need to be stop.   6. COPD, polymyalgia rheumatica stable, continue Motrin when necessary. Continue prednisone 10 mg by mouth daily Pt at risk for osteoporosis as well as avascular necrosis as well as pathological fracture from her chronic steroid use. This was notified daughter.  7. MGUS followed by oncology as outpatient  8. Hypertension blood pressure stable, continue metoprolol, Cardizem.  9. Dysphagia. Last admission patient was actually seen by speech therapy and was recommended to eat a dysphagia 3 diet. The daughter patient has been on regular diet at home and for last few days has drank only liquids due to intake. Currently on regular diet and tolerating.  Diet: regular diet,  DVT Prophylaxis: subcutaneous Heparin  Advance goals of care discussion: DNR DNI  Family Communication: family was present at bedside, at the time of interview. The pt provided permission to discuss medical plan with the family. Opportunity was given to ask question and all questions were answered satisfactorily.   Disposition:  Discharge to home tomorrow.  Consultants: ID Procedures: none  Antibiotics: Anti-infectives    Start     Dose/Rate Route Frequency Ordered Stop   03/15/17 1600  vancomycin (VANCOCIN) IVPB 750 mg/150 ml premix  Status:  Discontinued     750 mg 150 mL/hr over 60 Minutes Intravenous Every 24 hours 03/14/17 1954  03/15/17 1422   03/14/17 2200  piperacillin-tazobactam (ZOSYN) IVPB 3.375 g  Status:  Discontinued     3.375 g 12.5 mL/hr over 240 Minutes Intravenous Every 8 hours 03/14/17 1954 03/15/17 1422   03/14/17 1500  vancomycin (VANCOCIN) IVPB 1000 mg/200 mL premix     1,000 mg 200 mL/hr over 60 Minutes Intravenous  Once 03/14/17 1450 03/14/17 1607   03/14/17 1500  piperacillin-tazobactam (ZOSYN) IVPB 3.375 g     3.375 g 100 mL/hr over 30 Minutes Intravenous  Once 03/14/17 1450 03/14/17 1851       Objective: Physical Exam: Vitals:   03/14/17 2236 03/15/17 0453 03/15/17 0912 03/15/17 1524  BP:  (!) 149/72  (!) 152/81  Pulse:  75  78  Resp:  19  18  Temp:  97.9 F (36.6 C)  97.8 F (36.6 C)  TempSrc:  Axillary  Oral  SpO2: 95% 99% 96% 95%  Weight:      Height:        Intake/Output Summary (Last 24 hours) at 03/15/17 1751 Last data filed at 03/15/17 1700  Gross per 24 hour  Intake              360 ml  Output             2000 ml  Net            -1640 ml   Filed Weights   03/14/17 1509  Weight: 63 kg (139 lb)   General: Alert, Awake and Oriented to Time, Place and Person. Appear in mild distress, affect appropriate Eyes: PERRL, Conjunctiva normal ENT: Oral Mucosa clear moist. Neck: difficult to assess JVD, no Abnormal Mass Or lumps Cardiovascular: S1 and S2 Present, no Murmur, Peripheral Pulses Present Respiratory: normal respiratory effort, Bilateral Air entry equal and Decreased, no use of accessory muscle, Clear to Auscultation, no Crackles, no wheezes Abdomen: Bowel Sound present, Soft and no tenderness, no hernia Skin: no redness, no Rash, no induration. Diffuse bruising Extremities: no Pedal edema, no calf tenderness SLR positive on right Neurologic: Grossly no focal neuro deficit. Bilaterally Equal motor strength  Data Reviewed: CBC:  Recent Labs Lab 03/14/17 1344 03/15/17 0404  WBC 15.5* 13.4*  NEUTROABS 12.8*  --   HGB 10.3* 9.5*  HCT 29.6* 27.6*  MCV 87.6  86.8  PLT 230 259   Basic Metabolic Panel:  Recent Labs Lab 03/14/17 1344 03/15/17 0404  NA 130* 130*  K 4.5 4.8  CL 94* 96*  CO2 25 26  GLUCOSE 120* 115*  BUN 31* 31*  CREATININE 1.13* 1.17*  CALCIUM 9.5 9.2    Liver Function Tests:  Recent Labs Lab 03/14/17 1344 03/15/17 0404  AST 14* 14*  ALT 14 13*  ALKPHOS 42 39  BILITOT 0.3 0.4  PROT 6.3* 5.8*  ALBUMIN 3.4* 3.0*   No results for input(s): LIPASE, AMYLASE in the last 168 hours. No results for input(s): AMMONIA in the last 168 hours. Coagulation Profile: No results for input(s): INR, PROTIME in the last 168 hours. Cardiac Enzymes: No results for input(s): CKTOTAL, [name], [name], TROPONINI in the last 168 hours. BNP (last 3 results) No results for input(s): PROBNP in the last 8760 hours. CBG: No results for input(s): GLUCAP in the last 168 hours. Studies: Ct Hip Right Wo Contrast  Result Date: 03/15/2017 CLINICAL  DATA:  Worsening right hip pain after recent fall. EXAM: CT OF THE RIGHT HIP WITHOUT CONTRAST TECHNIQUE: Multidetector CT imaging of the right hip was performed according to the standard protocol. Multiplanar CT image reconstructions were also generated. COMPARISON:  Right hip x-rays from yesterday. CT abdomen pelvis dated January 16, 2017. FINDINGS: Bones/Joint/Cartilage No acute fracture or malalignment. No joint effusion. Degenerative changes of the right hip, pubic symphysis, and right sacroiliac joint. Subchondral cystic change in the right acetabulum. Questionable chondrocalcinosis in the right hip joint. Osteopenia. Ligaments Suboptimally assessed by CT. Muscles and Tendons Dystrophic calcifications at the right hamstring origins, consistent with prior injury. No significant muscle atrophy. Soft tissues Sigmoid diverticulosis.  No acute intrapelvic abnormality. IMPRESSION: 1.  No acute osseous abnormality. 2. Mild degenerative changes of the right hip joint with questionable chondrocalcinosis, which  could be age related or reflect CPPD arthropathy. Electronically Signed   By: Titus Dubin M.D.   On: 03/15/2017 11:32   US Abdomen Limited  Result Date: 03/15/2017 CLINICAL DATA:  Possible ascites requiring paracentesis. EXAM: LIMITED ABDOMEN ULTRASOUND FOR ASCITES TECHNIQUE: Limited ultrasound survey for ascites was performed in all four abdominal quadrants. COMPARISON:  None. FINDINGS: Survey of the peritoneal cavity shows no ascites. Paracentesis was therefore not performed. IMPRESSION: No ascites identified in the peritoneal cavity. Electronically Signed   By: Aletta Edouard M.D.   On: 03/15/2017 15:30    Scheduled Meds: . cloZAPine  300 mg Oral QHS  . diltiazem  180 mg Oral Daily  . enoxaparin (LOVENOX) injection  40 mg Subcutaneous Q24H  . gabapentin  100 mg Oral TID  . guaiFENesin  600 mg Oral BID  . [START ON 03/16/2017] ipratropium-albuterol  3 mL Inhalation BID  . isosorbide mononitrate  60 mg Oral Daily  . levothyroxine  75 mcg Oral QAC breakfast  . loratadine  10 mg Oral Daily  . metoprolol succinate  100 mg Oral Q breakfast  . mometasone-formoterol  2 puff Inhalation BID  . pantoprazole  40 mg Oral Daily  . polyethylene glycol  17 g Oral BID  . pravastatin  20 mg Oral QHS  . predniSONE  10 mg Oral Q breakfast  . senna  2 tablet Oral QHS  . sodium chloride flush  3 mL Intravenous Q12H   Continuous Infusions: . sodium chloride     PRN Meds: sodium chloride, acetaminophen, aspirin EC, HYDROmorphone (DILAUDID) injection, HYDROmorphone, ipratropium-albuterol, ondansetron **OR** ondansetron (ZOFRAN) IV, sodium chloride flush, temazepam  Time spent: 35 minutes  Author: Berle Mull, MD [facility] Pager: [phone] 03/15/2017 5:51 PM  If 7PM-7AM, please contact night-coverage at [email or web address], password [facility]

## 2017-03-15 NOTE — Progress Notes (Signed)
[facility]  Active pt with [facility] prior to this admission.  [facility] team will follow Ms. Obenchain during this admission to support [facility] services/orders at DC back home.  If patient discharges after hours, please call [phone].   Larry Sierras 03/15/2017, 8:21 AM

## 2017-03-15 NOTE — Progress Notes (Signed)
Patient ID: Rebecca [name], female   DOB: 31-Aug-1940, 76 y.o.   MRN: [id number] Pt presented to ultrasound department today for diagnostic paracentesis.  On  limited ultrasound of abdomen in all 4 quadrants there is no accessible ascites present. Procedure canceled. Patient informed.

## 2017-03-16 DIAGNOSIS — E039 Hypothyroidism, unspecified: Secondary | ICD-10-CM | POA: Diagnosis not present

## 2017-03-16 DIAGNOSIS — I6789 Other cerebrovascular disease: Secondary | ICD-10-CM | POA: Diagnosis not present

## 2017-03-16 DIAGNOSIS — I5032 Chronic diastolic (congestive) heart failure: Secondary | ICD-10-CM | POA: Diagnosis not present

## 2017-03-16 DIAGNOSIS — I10 Essential (primary) hypertension: Secondary | ICD-10-CM

## 2017-03-16 DIAGNOSIS — J9811 Atelectasis: Secondary | ICD-10-CM | POA: Diagnosis present

## 2017-03-16 DIAGNOSIS — N182 Chronic kidney disease, stage 2 (mild): Secondary | ICD-10-CM | POA: Diagnosis not present

## 2017-03-16 DIAGNOSIS — K219 Gastro-esophageal reflux disease without esophagitis: Secondary | ICD-10-CM | POA: Diagnosis not present

## 2017-03-16 DIAGNOSIS — R41841 Cognitive communication deficit: Secondary | ICD-10-CM | POA: Diagnosis not present

## 2017-03-16 DIAGNOSIS — R1312 Dysphagia, oropharyngeal phase: Secondary | ICD-10-CM | POA: Diagnosis not present

## 2017-03-16 DIAGNOSIS — R2681 Unsteadiness on feet: Secondary | ICD-10-CM | POA: Diagnosis not present

## 2017-03-16 DIAGNOSIS — M353 Polymyalgia rheumatica: Secondary | ICD-10-CM | POA: Diagnosis not present

## 2017-03-16 DIAGNOSIS — Z7952 Long term (current) use of systemic steroids: Secondary | ICD-10-CM | POA: Diagnosis not present

## 2017-03-16 DIAGNOSIS — E785 Hyperlipidemia, unspecified: Secondary | ICD-10-CM | POA: Diagnosis not present

## 2017-03-16 DIAGNOSIS — E871 Hypo-osmolality and hyponatremia: Secondary | ICD-10-CM | POA: Diagnosis not present

## 2017-03-16 DIAGNOSIS — J189 Pneumonia, unspecified organism: Secondary | ICD-10-CM | POA: Diagnosis not present

## 2017-03-16 DIAGNOSIS — R188 Other ascites: Secondary | ICD-10-CM | POA: Diagnosis not present

## 2017-03-16 DIAGNOSIS — G9341 Metabolic encephalopathy: Secondary | ICD-10-CM | POA: Diagnosis present

## 2017-03-16 DIAGNOSIS — E1122 Type 2 diabetes mellitus with diabetic chronic kidney disease: Secondary | ICD-10-CM | POA: Diagnosis present

## 2017-03-16 DIAGNOSIS — R5383 Other fatigue: Secondary | ICD-10-CM | POA: Diagnosis not present

## 2017-03-16 DIAGNOSIS — R278 Other lack of coordination: Secondary | ICD-10-CM | POA: Diagnosis not present

## 2017-03-16 DIAGNOSIS — Z515 Encounter for palliative care: Secondary | ICD-10-CM | POA: Diagnosis not present

## 2017-03-16 DIAGNOSIS — Z955 Presence of coronary angioplasty implant and graft: Secondary | ICD-10-CM | POA: Diagnosis not present

## 2017-03-16 DIAGNOSIS — I5033 Acute on chronic diastolic (congestive) heart failure: Secondary | ICD-10-CM | POA: Diagnosis present

## 2017-03-16 DIAGNOSIS — D472 Monoclonal gammopathy: Secondary | ICD-10-CM | POA: Diagnosis not present

## 2017-03-16 DIAGNOSIS — E861 Hypovolemia: Secondary | ICD-10-CM | POA: Diagnosis present

## 2017-03-16 DIAGNOSIS — S79911A Unspecified injury of right hip, initial encounter: Secondary | ICD-10-CM | POA: Diagnosis not present

## 2017-03-16 DIAGNOSIS — E8770 Fluid overload, unspecified: Secondary | ICD-10-CM | POA: Diagnosis not present

## 2017-03-16 DIAGNOSIS — N179 Acute kidney failure, unspecified: Secondary | ICD-10-CM | POA: Diagnosis not present

## 2017-03-16 DIAGNOSIS — R339 Retention of urine, unspecified: Secondary | ICD-10-CM | POA: Diagnosis present

## 2017-03-16 DIAGNOSIS — N183 Chronic kidney disease, stage 3 (moderate): Secondary | ICD-10-CM | POA: Diagnosis not present

## 2017-03-16 DIAGNOSIS — G894 Chronic pain syndrome: Secondary | ICD-10-CM

## 2017-03-16 DIAGNOSIS — N184 Chronic kidney disease, stage 4 (severe): Secondary | ICD-10-CM | POA: Diagnosis not present

## 2017-03-16 DIAGNOSIS — D631 Anemia in chronic kidney disease: Secondary | ICD-10-CM | POA: Diagnosis not present

## 2017-03-16 DIAGNOSIS — Z7189 Other specified counseling: Secondary | ICD-10-CM | POA: Diagnosis not present

## 2017-03-16 DIAGNOSIS — M25551 Pain in right hip: Secondary | ICD-10-CM | POA: Diagnosis not present

## 2017-03-16 DIAGNOSIS — M47817 Spondylosis without myelopathy or radiculopathy, lumbosacral region: Secondary | ICD-10-CM | POA: Diagnosis present

## 2017-03-16 DIAGNOSIS — I130 Hypertensive heart and chronic kidney disease with heart failure and stage 1 through stage 4 chronic kidney disease, or unspecified chronic kidney disease: Secondary | ICD-10-CM | POA: Diagnosis present

## 2017-03-16 DIAGNOSIS — K5909 Other constipation: Secondary | ICD-10-CM | POA: Diagnosis not present

## 2017-03-16 DIAGNOSIS — I700 Atherosclerosis of aorta: Secondary | ICD-10-CM | POA: Diagnosis not present

## 2017-03-16 DIAGNOSIS — I2510 Atherosclerotic heart disease of native coronary artery without angina pectoris: Secondary | ICD-10-CM | POA: Diagnosis present

## 2017-03-16 DIAGNOSIS — R1310 Dysphagia, unspecified: Secondary | ICD-10-CM | POA: Diagnosis present

## 2017-03-16 DIAGNOSIS — M7061 Trochanteric bursitis, right hip: Secondary | ICD-10-CM | POA: Diagnosis present

## 2017-03-16 DIAGNOSIS — M810 Age-related osteoporosis without current pathological fracture: Secondary | ICD-10-CM | POA: Diagnosis present

## 2017-03-16 DIAGNOSIS — M6281 Muscle weakness (generalized): Secondary | ICD-10-CM | POA: Diagnosis not present

## 2017-03-16 DIAGNOSIS — R05 Cough: Secondary | ICD-10-CM | POA: Diagnosis not present

## 2017-03-16 LAB — COMPREHENSIVE METABOLIC PANEL
ALT: 12 U/L — AB (ref 14–54)
AST: 15 U/L (ref 15–41)
Albumin: 2.8 g/dL — ABNORMAL LOW (ref 3.5–5.0)
Alkaline Phosphatase: 31 U/L — ABNORMAL LOW (ref 38–126)
Anion gap: 9 (ref 5–15)
BUN: 30 mg/dL — ABNORMAL HIGH (ref 6–20)
CHLORIDE: 98 mmol/L — AB (ref 101–111)
CO2: 23 mmol/L (ref 22–32)
CREATININE: 1.11 mg/dL — AB (ref 0.44–1.00)
Calcium: 8.8 mg/dL — ABNORMAL LOW (ref 8.9–10.3)
GFR calc non Af Amer: 46 mL/min — ABNORMAL LOW (ref >60)
GFR, EST AFRICAN AMERICAN: 54 mL/min — AB (ref >60)
Glucose, Bld: 92 mg/dL (ref 65–99)
Potassium: 4.7 mmol/L (ref 3.5–5.1)
SODIUM: 130 mmol/L — AB (ref 135–145)
Total Bilirubin: 0.3 mg/dL (ref 0.3–1.2)
Total Protein: 5.5 g/dL — ABNORMAL LOW (ref 6.5–8.1)

## 2017-03-16 LAB — CBC WITH DIFFERENTIAL/PLATELET
Basophils Absolute: 0 10*3/uL (ref 0.0–0.1)
Basophils Relative: 0 %
EOS ABS: 0.2 10*3/uL (ref 0.0–0.7)
Eosinophils Relative: 1 %
HEMATOCRIT: 26 % — AB (ref 36.0–46.0)
HEMOGLOBIN: 9 g/dL — AB (ref 12.0–15.0)
LYMPHS ABS: 2.7 10*3/uL (ref 0.7–4.0)
Lymphocytes Relative: 15 %
MCH: 30.9 pg (ref 26.0–34.0)
MCHC: 34.6 g/dL (ref 30.0–36.0)
MCV: 89.3 fL (ref 78.0–100.0)
MONOS PCT: 7 %
Monocytes Absolute: 1.2 10*3/uL — ABNORMAL HIGH (ref 0.1–1.0)
NEUTROS PCT: 77 %
Neutro Abs: 14.4 10*3/uL — ABNORMAL HIGH (ref 1.7–7.7)
PLATELETS: 206 10*3/uL (ref 150–400)
RBC: 2.91 MIL/uL — ABNORMAL LOW (ref 3.87–5.11)
RDW: 13.8 % (ref 11.5–15.5)
WBC: 18.6 10*3/uL — ABNORMAL HIGH (ref 4.0–10.5)

## 2017-03-16 LAB — URINE CULTURE: Culture: >=100000 — AB

## 2017-03-16 MED ORDER — MORPHINE SULFATE ER 15 MG PO TBCR
15.0000 mg | EXTENDED_RELEASE_TABLET | Freq: Two times a day (BID) | ORAL | Status: DC
  Administered 2017-03-16 – 2017-03-21 (×11): 15 mg via ORAL
  Filled 2017-03-16 (×11): qty 1

## 2017-03-16 MED ORDER — SENNOSIDES-DOCUSATE SODIUM 8.6-50 MG PO TABS
1.0000 | ORAL_TABLET | Freq: Two times a day (BID) | ORAL | Status: DC
  Administered 2017-03-16 – 2017-03-26 (×20): 1 via ORAL
  Filled 2017-03-16 (×21): qty 1

## 2017-03-16 MED ORDER — SORBITOL 70 % SOLN
30.0000 mL | Freq: Once | Status: AC
  Administered 2017-03-16: 30 mL via ORAL
  Filled 2017-03-16: qty 30

## 2017-03-16 MED ORDER — HYDROMORPHONE HCL 4 MG PO TABS
4.0000 mg | ORAL_TABLET | ORAL | Status: DC | PRN
  Administered 2017-03-16 – 2017-03-19 (×4): 4 mg via ORAL
  Filled 2017-03-16 (×4): qty 1

## 2017-03-16 NOTE — Evaluation (Signed)
Physical Therapy Evaluation Patient Details Name: SYA NESTLER MRN: [id number] DOB: [birth year] Today's Date: 03/16/2017   History of Present Illness  PMH of COPD, chronic prednisone therapy, CAD, CKD stage IV, diastolic heart failure, MGUS, hypothyroidism, CVA, polymyalgia rheumatica; admitted on 03/14/2017, presented with complaint of right hip pain (likely combination from L5-S1 spondylosis, seen on MRI of lumbar spine in 2015 and muscular pain in gluteal region)  Clinical Impression  Pt admitted with above diagnosis. Pt currently with functional limitations due to the deficits listed below (see PT Problem List).  Pt will benefit from skilled PT to increase their independence and safety with mobility to allow discharge to the venue listed below.  Daughter feels unable to take pt home at this time based on assist level.  Daughter states she cannot lift and pt would need to be able to ambulate at least to bathroom without physical assist.  Daughter hopeful pt will be more independent prior to d/c however stated she would be accepting of SNF, if pt does not progress.      Follow Up Recommendations SNF    Equipment Recommendations  None recommended by PT    Recommendations for Other Services       Precautions / Restrictions Precautions Precautions: Fall      Mobility  Bed Mobility Overal bed mobility: Needs Assistance Bed Mobility: Supine to Sit     Supine to sit: Min guard     General bed mobility comments: provided hands for pt to self pull trunk upright  Transfers Overall transfer level: Needs assistance Equipment used: Rolling walker (2 wheeled) Transfers: Sit to/from [facility] Sit to Stand: Min assist Stand pivot transfers: Min assist       General transfer comment: verbal cues for hand placement, assist with R LE buckling due to pain, cues UE support on RW for better LE pain control  Ambulation/Gait             General Gait Details: pt felt  unable to tolerate at this time due to pain  Stairs            Wheelchair Mobility    Modified Rankin (Stroke Patients Only)       Balance Overall balance assessment: History of Falls (daughter reports no recent falls)                                           Pertinent Vitals/Pain Pain Assessment: Faces Faces Pain Scale: Hurts even more Pain Location: R hip/groin Pain Descriptors / Indicators: Sore;Discomfort Pain Intervention(s): Repositioned;Premedicated before session;Monitored during session    Home Living Family/patient expects to be discharged to:: Private residence Living Arrangements: Children Available Help at Discharge: Family Type of Home: House Home Access: Stairs to enter Entrance Stairs-Rails: Right;Left;Can reach both Entrance Stairs-Number of Steps: 4 Home Layout: Two level;Able to live on main level with bedroom/bathroom Home Equipment: Shower seat;Walker - 2 wheels;Walker - 4 wheels;Hospital bed;Bedside commode      Prior Function Level of Independence: Independent with assistive device(s)         Comments: daughter assists with adls as needed, pt typically ambulatory without assistive device in home and uses RW for outside/community     Hand Dominance        Extremity/Trunk Assessment        Lower Extremity Assessment Lower Extremity Assessment: Generalized weakness;RLE deficits/detail RLE Deficits /  Details: pt reports pain with any movement so did not assess today, pt self assists LE with mobility       Communication   Communication: No difficulties  Cognition Arousal/Alertness: Awake/alert Behavior During Therapy: WFL for tasks assessed/performed Overall Cognitive Status: Within Functional Limits for tasks assessed                                 General Comments: slightly groggy likely from premedication, follows commands      General Comments      Exercises     Assessment/Plan     PT Assessment Patient needs continued PT services  PT Problem List Decreased strength;Decreased mobility;Decreased balance;Decreased knowledge of use of DME;Pain;Decreased activity tolerance       PT Treatment Interventions Gait training;DME instruction;Therapeutic activities;Therapeutic exercise;Functional mobility training;Balance training;Patient/family education    PT Goals (Current goals can be found in the Care Plan section)  Acute Rehab PT Goals PT Goal Formulation: With patient/family Time For Goal Achievement: 03/30/17 Potential to Achieve Goals: Good    Frequency Min 3X/week   Barriers to discharge        Co-evaluation               AM-PAC PT "6 Clicks" Daily Activity  Outcome Measure Difficulty turning over in bed (including adjusting bedclothes, sheets and blankets)?: A Lot Difficulty moving from lying on back to sitting on the side of the bed? : Unable Difficulty sitting down on and standing up from a chair with arms (e.g., wheelchair, bedside commode, etc,.)?: Unable Help needed moving to and from a bed to chair (including a wheelchair)?: A Little Help needed walking in hospital room?: Total Help needed climbing 3-5 steps with a railing? : Total 6 Click Score: 9    End of Session Equipment Utilized During Treatment: Gait belt Activity Tolerance: Patient limited by fatigue;Patient limited by pain Patient left: in chair;with call bell/phone within reach;with family/visitor present Nurse Communication: Mobility status PT Visit Diagnosis: Difficulty in walking, not elsewhere classified (R26.2);Pain Pain - Right/Left: Right Pain - part of body: Leg    Time: 1406-1430 PT Time Calculation (min) (ACUTE ONLY): 24 min   Charges:   PT Evaluation $PT Eval Low Complexity: 1 Low     PT G Codes:   PT G-Codes **NOT FOR INPATIENT CLASS** Functional Assessment Tool Used: AM-PAC 6 Clicks Basic Mobility;Clinical judgement Functional Limitation: Mobility: Walking  and moving around Mobility: Walking and Moving Around Current Status (Z6109): At least 40 percent but less than 60 percent impaired, limited or restricted Mobility: Walking and Moving Around Goal Status [phone]): At least 20 percent but less than 40 percent impaired, limited or restricted   Carmelia Bake, PT, DPT 03/16/2017 Pager: [phone]   York Ram E 03/16/2017, 4:48 PM

## 2017-03-16 NOTE — Progress Notes (Signed)
PT Cancellation Note  Patient Details Name: Rebecca [name] MRN: [id number] DOB: [birth year]   Cancelled Treatment:    Reason Eval/Treat Not Completed: Pain limiting ability to participate  Pt refused to participate stating she was in too much pain.  Reports pain meds recently however continued to decline PT.  Pt not willing to participate at this time.   Eian Vandervelden,KATHrine E 03/16/2017, 1:26 PM Carmelia Bake, PT, DPT 03/16/2017 Pager: [phone]

## 2017-03-16 NOTE — Progress Notes (Signed)
PROGRESS NOTE    Rebecca [name]  BTD:[id number] DOB: 1940/11/17 DOA: 03/14/2017 PCP: [name], MD   Brief Narrative:  Pt. with PMH of COPD, chronic prednisone therapy, CAD status post DES to RCA in 2006, CKD stage IV, diastolic heart failure, MGUS, hypothyroidism ; admitted on 03/14/2017, presented with complaint of right hip pain, was found to have muscular pain. Currently further plan is pain control.   Assessment & Plan:   Principal Problem:   Acute right hip pain Active Problems:   Hypothyroidism   Dyslipidemia   Anemia in chronic renal disease   GASTROESOPHAGEAL REFLUX, NO ESOPHAGITIS   Chronic diastolic (congestive) heart failure (HCC)   Polymyalgia rheumatica (HCC)   MGUS (monoclonal gammopathy of unknown significance)   CKD stage 4, GFR 15-29 ml/min    Essential hypertension   AKI (acute kidney injury) (HCC)   Pneumonia   Chronic constipation   Chronic pain disorder   #1 right hip pain Likely secondary to L5-S1 spondylosis seen on MRI of the L-spine in 2015 with muscular pain in the gluteal region. CT right hip which was done 03/15/2017 with no acute osseous abnormalities, mild degenerative changes of the right hip joint with questionable chondrocalcinosis which could be age-related or reflect CPPD arthropathy. Patient still with significant pain and states IV Dilaudid helps her pain however only last about 2 hours. Per family patient's oral Dilaudid dose was increased to 4 mg as needed per her hematologist/oncologist just prior to admission. Will place patient on long-acting MS Contin 15 mg twice daily. Increase oral Dilaudid to 4 mg every 4 hours as needed for breakthrough pain. Continue Neurontin which was started yesterday, IV Dilaudid when necessary severe pain. It is noted that patient scheduled to see an interventional pain doctors as an outpatient for her back pain and may benefit from a spinal/steroid injection. PT/OT.  #2 healthcare associated  pneumonia----ruled out Chest x-ray obtained on admission had some concerns for possible pneumonia. Patient was seen in consultation by ID patient clinically does not have a pneumonia. Blood cultures obtained with no growth to date. IV antibiotics were discontinued by ID recommendations. Patient afebrile. Patient with a chronic leukocytosis. Follow.  #3 repeated UTIs/colonization Patient has had multiple rounds of antibiotics for UTIs last urine culture 03/04/2017 showing pseudomonas aeruginosa. Patient with a chronically elevated leukocytosis on chronic prednisone therapy. Urine cultures positive for Pseudomonas. Patient has been seen in consultation by ID who feel that patient likely has colonization rather than a true acute infection. IV antibiotics have been discontinued. Monitor.  #4 concerning for ascites--ruled out It was noted that patient was post to get a paracentesis as outpatient ordered per patient's oncologist. Patient daughter had insisted that ultrasound and paracentesis be done during this hospitalization and a such abdominal ultrasound was ordered which was negative for ascites. Outpatient follow-up.  #5 coronary artery disease Currently stable. Continue current regimen of aspirin and Plavix. Continue Cardizem CD, Imdur, Toprol-XL, statin. Patient was to get a spinal injection in the outpatient setting Plavix will likely need to be discontinued at that time however will defer to pain management doctor at this time.  #6 COPD/polymyalgia rheumatica Stable. Continue home regimen of prednisone 10 mg daily. Patient on chronic prednisone and increased risk for avascular necrosis, pathologic fracture, osteoporosis. Continue Motrin as needed.  #7 MGUS Outpatient follow-up with oncology.  #8 hypertension Blood pressure stable. Continue current regimen of Cardizem CD,imdur, Toprol-XL.  #9 constipation Placed on Senokot 1 tablet twice daily. Continue current regimen of  MiraLAX. Will give  a dose of sorbitol 1 as well as a soapsuds enema 1.  #10 dysphagia During last admission speech therapy evaluated the patient and recommended dysphagia 3 diet. It is noted that patient's daughter states patient on a regular diet at home. Patient tolerating current diet. Follow.   DVT prophylaxis: Lovenox Code Status: DO NOT RESUSCITATE Family Communication: Updated patient and daughter at bedside. Disposition Plan: Likely skilled nursing facility versus home with home health when pain has improved and better controlled.   Consultants:   Infectious disease: Dr.Comer 03/15/2017  Procedures:   CT right hip 03/15/2017  Chest x-ray 03/14/2017  Abdominal ultrasound 03/15/2017  Plain films of the right hip and pelvis 03/14/2017  Antimicrobials:   IV vancomycin 03/14/2017>>>>> 03/15/2017  IV Zosyn 03/14/2017>>>> 03/15/2017   Subjective: Patient complaining of right hip pain. Patient states pain medication does not last that long on the last approximately 2 hours. Patient's family states patient Dilaudid home oral dose was increased to 4 mg as needed just prior to admission. Patient also complaining of constipation. No nausea or vomiting. No chest pain. No shortness of breath.  Objective: Vitals:   03/15/17 2004 03/16/17 0523 03/16/17 0811 03/16/17 0812  BP: (!) 142/79 (!) 146/82    Pulse: 76 84    Resp: 19 17    Temp: 98 F (36.7 C) 98.6 F (37 C)    TempSrc: Oral Oral    SpO2: 97% 96% 98% 98%  Weight:      Height:        Intake/Output Summary (Last 24 hours) at 03/16/17 1132 Last data filed at 03/16/17 1017  Gross per 24 hour  Intake              760 ml  Output              400 ml  Net              360 ml   Filed Weights   03/14/17 1509  Weight: 63 kg (139 lb)    Examination:  General exam: Appears calm and comfortable  Respiratory system: Clear to auscultation.No wheezes, no crackles, no rhonchi. Respiratory effort normal. Cardiovascular system: S1 & S2  heard, RRR. No JVD, murmurs, rubs, gallops or clicks. No pedal edema. Gastrointestinal system: Abdomen is nondistended, soft and nontender. No organomegaly or masses felt. Normal bowel sounds heard. Central nervous system: Alert and oriented. No focal neurological deficits. Extremities: Symmetric 5 x 5 power. Skin: No rashes, lesions or ulcers Psychiatry: Judgement and insight appear normal. Mood & affect appropriate.     Data Reviewed: I have personally reviewed following labs and imaging studies  CBC:  Recent Labs Lab 03/14/17 1344 03/15/17 0404 03/16/17 0353  WBC 15.5* 13.4* 18.6*  NEUTROABS 12.8*  --  14.4*  HGB 10.3* 9.5* 9.0*  HCT 29.6* 27.6* 26.0*  MCV 87.6 86.8 89.3  PLT 230 220 229   Basic Metabolic Panel:  Recent Labs Lab 03/14/17 1344 03/15/17 0404 03/16/17 0353  NA 130* 130* 130*  K 4.5 4.8 4.7  CL 94* 96* 98*  CO2 25 26 23   GLUCOSE 120* 115* 92  BUN 31* 31* 30*  CREATININE 1.13* 1.17* 1.11*  CALCIUM 9.5 9.2 8.8*   GFR: Estimated Creatinine Clearance: 37.4 mL/min (A) (by C-G formula based on SCr of 1.11 mg/dL (H)). Liver Function Tests:  Recent Labs Lab 03/14/17 1344 03/15/17 0404 03/16/17 0353  AST 14* 14* 15  ALT 14 13* 12*  ALKPHOS  42 39 31*  BILITOT 0.3 0.4 0.3  PROT 6.3* 5.8* 5.5*  ALBUMIN 3.4* 3.0* 2.8*   No results for input(s): LIPASE, AMYLASE in the last 168 hours. No results for input(s): AMMONIA in the last 168 hours. Coagulation Profile: No results for input(s): INR, PROTIME in the last 168 hours. Cardiac Enzymes: No results for input(s): CKTOTAL, [name], [name], TROPONINI in the last 168 hours. BNP (last 3 results) No results for input(s): PROBNP in the last 8760 hours. HbA1C: No results for input(s): HGBA1C in the last 72 hours. CBG: No results for input(s): GLUCAP in the last 168 hours. Lipid Profile: No results for input(s): CHOL, [name], [name], [name], [name], LDLDIRECT in the last 72 hours. Thyroid Function Tests: No  results for input(s): TSH, T4TOTAL, FREET4, T3FREE, THYROIDAB in the last 72 hours. Anemia Panel: No results for input(s): VITAMINB12, FOLATE, FERRITIN, TIBC, IRON, RETICCTPCT in the last 72 hours. Sepsis Labs:  Recent Labs Lab 03/14/17 1359  LATICACIDVEN 0.55    Recent Results (from the past 240 hour(s))  Urine Culture     Status: Abnormal   Collection Time: 03/07/17  3:49 PM  Result Value Ref Range Status   Urine Culture, Routine Final report (A)  Final   Organism ID, Bacteria Comment (A)  Final    Comment: Pseudomonas aeruginosa Greater than 100,000 colony forming units per mL    Antimicrobial Susceptibility Comment  Final    Comment:       ** S = Susceptible; I = Intermediate; R = Resistant **                    P = Positive; N = Negative             MICS are expressed in micrograms per mL    Antibiotic                 RSLT#1    RSLT#2    RSLT#3    RSLT#4 Amikacin                       S Cefepime                       S Ceftazidime                    S Ciprofloxacin                  S Gentamicin                     S Imipenem                       S Levofloxacin                   S Meropenem                      S Piperacillin                   S Ticarcillin                    R Tobramycin                     S   Blood culture (routine x 2)     Status: None (Preliminary result)   Collection Time: 03/14/17  1:45 PM  Result Value Ref Range Status   Specimen Description BLOOD LEFT ANTECUBITAL  Final   Special Requests   Final    BOTTLES DRAWN AEROBIC AND ANAEROBIC Blood Culture adequate volume   Culture   Final    NO GROWTH < 24 HOURS Performed at [facility], [address], [place], [place] [address]    Report Status PENDING  Incomplete  Blood culture (routine x 2)     Status: None (Preliminary result)   Collection Time: 03/14/17  1:48 PM  Result Value Ref Range Status   Specimen Description BLOOD RIGHT ANTECUBITAL  Final   Special Requests   Final     BOTTLES DRAWN AEROBIC AND ANAEROBIC Blood Culture adequate volume   Culture   Final    NO GROWTH < 24 HOURS Performed at [facility], [place] [address], [place], [place] [address]    Report Status PENDING  Incomplete  Urine culture     Status: Abnormal   Collection Time: 03/14/17  2:44 PM  Result Value Ref Range Status   Specimen Description URINE, CATHETERIZED  Final   Special Requests NONE  Final   Culture >=100,000 COLONIES/mL PSEUDOMONAS AERUGINOSA (A)  Final   Report Status 03/16/2017 FINAL  Final   Organism ID, Bacteria PSEUDOMONAS AERUGINOSA (A)  Final      Susceptibility   Pseudomonas aeruginosa - MIC*    CEFTAZIDIME 4 SENSITIVE Sensitive     CIPROFLOXACIN <=0.25 SENSITIVE Sensitive     GENTAMICIN <=1 SENSITIVE Sensitive     IMIPENEM 2 SENSITIVE Sensitive     PIP/TAZO 8 SENSITIVE Sensitive     CEFEPIME 2 SENSITIVE Sensitive     * >=100,000 COLONIES/mL PSEUDOMONAS AERUGINOSA         Radiology Studies: Dg Chest 2 View  Result Date: 03/14/2017 CLINICAL DATA:  Worsening hip pain. EXAM: CHEST  2 VIEW COMPARISON:  01/25/2017 FINDINGS: Artifact overlies the chest. The right lung is clear. There is some chronic scarring at the left base, but this may be more pronounced, suggesting some left base atelectasis or infiltrate. No measurable effusion. No acute bone finding. IMPRESSION: Chronic scarring at the left base, but possibly with some slightly worsened atelectasis or infiltrate. Electronically Signed   By: Nelson Chimes M.D.   On: 03/14/2017 14:39   Ct Hip Right Wo Contrast  Result Date: 03/15/2017 CLINICAL DATA:  Worsening right hip pain after recent fall. EXAM: CT OF THE RIGHT HIP WITHOUT CONTRAST TECHNIQUE: Multidetector CT imaging of the right hip was performed according to the standard protocol. Multiplanar CT image reconstructions were also generated. COMPARISON:  Right hip x-rays from yesterday. CT abdomen pelvis dated January 16, 2017. FINDINGS: Bones/Joint/Cartilage No  acute fracture or malalignment. No joint effusion. Degenerative changes of the right hip, pubic symphysis, and right sacroiliac joint. Subchondral cystic change in the right acetabulum. Questionable chondrocalcinosis in the right hip joint. Osteopenia. Ligaments Suboptimally assessed by CT. Muscles and Tendons Dystrophic calcifications at the right hamstring origins, consistent with prior injury. No significant muscle atrophy. Soft tissues Sigmoid diverticulosis.  No acute intrapelvic abnormality. IMPRESSION: 1.  No acute osseous abnormality. 2. Mild degenerative changes of the right hip joint with questionable chondrocalcinosis, which could be age related or reflect CPPD arthropathy. Electronically Signed   By: Titus Dubin M.D.   On: 03/15/2017 11:32   US Abdomen Limited  Result Date: 03/15/2017 CLINICAL DATA:  Possible ascites requiring paracentesis. EXAM: LIMITED ABDOMEN ULTRASOUND FOR ASCITES TECHNIQUE: Limited ultrasound  survey for ascites was performed in all four abdominal quadrants. COMPARISON:  None. FINDINGS: Survey of the peritoneal cavity shows no ascites. Paracentesis was therefore not performed. IMPRESSION: No ascites identified in the peritoneal cavity. Electronically Signed   By: Aletta Edouard M.D.   On: 03/15/2017 15:30   Dg Hip Unilat W Or Wo Pelvis 2-3 Views Right  Result Date: 03/14/2017 CLINICAL DATA:  Recent fall. Worsening right hip pain for 1 week. Initial encounter. EXAM: DG HIP (WITH OR WITHOUT PELVIS) 2-3V RIGHT COMPARISON:  None. FINDINGS: There is no evidence of hip fracture or dislocation. There is no evidence of hip joint arthropathy or other focal bone abnormality. Mild degenerative changes are seen involving the pubic symphysis and visualized lower lumbar spine. IMPRESSION: No acute findings. Mild degenerative changes involving pubic symphysis and lower lumbar spine. Electronically Signed   By: Earle Gell M.D.   On: 03/14/2017 14:41        Scheduled Meds: .  clopidogrel  75 mg Oral Q breakfast  . cloZAPine  300 mg Oral QHS  . diltiazem  180 mg Oral Daily  . enoxaparin (LOVENOX) injection  40 mg Subcutaneous Q24H  . gabapentin  100 mg Oral TID  . guaiFENesin  600 mg Oral BID  . ipratropium-albuterol  3 mL Inhalation BID  . isosorbide mononitrate  60 mg Oral Daily  . levothyroxine  75 mcg Oral QAC breakfast  . loratadine  10 mg Oral Daily  . metoprolol succinate  100 mg Oral Q breakfast  . mometasone-formoterol  2 puff Inhalation BID  . morphine  15 mg Oral Q12H  . pantoprazole  40 mg Oral Daily  . polyethylene glycol  17 g Oral BID  . pravastatin  20 mg Oral QHS  . predniSONE  10 mg Oral Q breakfast  . senna-docusate  1 tablet Oral BID  . sodium chloride flush  3 mL Intravenous Q12H  . sorbitol  30 mL Oral Once  . tiotropium  18 mcg Inhalation Daily   Continuous Infusions: . sodium chloride       LOS: 0 days    Time spent: 40 minutes    Izel Eisenhardt, MD [facility] Pager [phone]  If 7PM-7AM, please contact night-coverage [email or web address] Password [email or web address] 03/16/2017, 11:31 AM

## 2017-03-17 DIAGNOSIS — I5032 Chronic diastolic (congestive) heart failure: Secondary | ICD-10-CM

## 2017-03-17 DIAGNOSIS — E871 Hypo-osmolality and hyponatremia: Secondary | ICD-10-CM

## 2017-03-17 DIAGNOSIS — N184 Chronic kidney disease, stage 4 (severe): Secondary | ICD-10-CM

## 2017-03-17 LAB — BASIC METABOLIC PANEL
Anion gap: 8 (ref 5–15)
Anion gap: 11 (ref 5–15)
BUN: 24 mg/dL — AB (ref 6–20)
BUN: 22 mg/dL — ABNORMAL HIGH (ref 6–20)
CHLORIDE: 93 mmol/L — AB (ref 101–111)
CO2: 25 mmol/L (ref 22–32)
CO2: 21 mmol/L — AB (ref 22–32)
CREATININE: 1.02 mg/dL — AB (ref 0.44–1.00)
Calcium: 8.7 mg/dL — ABNORMAL LOW (ref 8.9–10.3)
Calcium: 8.7 mg/dL — ABNORMAL LOW (ref 8.9–10.3)
Chloride: 94 mmol/L — ABNORMAL LOW (ref 101–111)
Creatinine, Ser: 0.98 mg/dL (ref 0.44–1.00)
GFR calc non Af Amer: 54 mL/min — ABNORMAL LOW (ref >60)
GFR, EST AFRICAN AMERICAN: 59 mL/min — AB (ref >60)
GFR, EST NON AFRICAN AMERICAN: 51 mL/min — AB (ref >60)
GLUCOSE: 115 mg/dL — AB (ref 65–99)
GLUCOSE: 190 mg/dL — AB (ref 65–99)
Potassium: 4.6 mmol/L (ref 3.5–5.1)
Potassium: 5.1 mmol/L (ref 3.5–5.1)
SODIUM: 127 mmol/L — AB (ref 135–145)
Sodium: 125 mmol/L — ABNORMAL LOW (ref 135–145)

## 2017-03-17 LAB — GLUCOSE, CAPILLARY
GLUCOSE-CAPILLARY: 228 mg/dL — AB (ref 65–99)
GLUCOSE-CAPILLARY: 209 mg/dL — AB (ref 65–99)

## 2017-03-17 LAB — TSH: TSH: 0.898 u[IU]/mL (ref 0.350–4.500)

## 2017-03-17 LAB — OSMOLALITY: OSMOLALITY: 272 mosm/kg — AB (ref 275–295)

## 2017-03-17 MED ORDER — LEVOTHYROXINE SODIUM 50 MCG PO TABS
75.0000 ug | ORAL_TABLET | Freq: Every day | ORAL | Status: DC
  Administered 2017-03-18 – 2017-03-26 (×9): 75 ug via ORAL
  Filled 2017-03-17 (×9): qty 1

## 2017-03-17 MED ORDER — INSULIN ASPART 100 UNIT/ML ~~LOC~~ SOLN
0.0000 [IU] | Freq: Every day | SUBCUTANEOUS | Status: DC
  Administered 2017-03-17 – 2017-03-25 (×3): 2 [IU] via SUBCUTANEOUS

## 2017-03-17 MED ORDER — INSULIN ASPART 100 UNIT/ML ~~LOC~~ SOLN
0.0000 [IU] | Freq: Three times a day (TID) | SUBCUTANEOUS | Status: DC
  Administered 2017-03-18: 2 [IU] via SUBCUTANEOUS
  Administered 2017-03-18: 3 [IU] via SUBCUTANEOUS
  Administered 2017-03-19: 2 [IU] via SUBCUTANEOUS
  Administered 2017-03-19: 1 [IU] via SUBCUTANEOUS
  Administered 2017-03-20: 3 [IU] via SUBCUTANEOUS
  Administered 2017-03-20 – 2017-03-21 (×2): 2 [IU] via SUBCUTANEOUS
  Administered 2017-03-22 (×2): 1 [IU] via SUBCUTANEOUS
  Administered 2017-03-23: 5 [IU] via SUBCUTANEOUS
  Administered 2017-03-23: 3 [IU] via SUBCUTANEOUS
  Administered 2017-03-24: 2 [IU] via SUBCUTANEOUS
  Administered 2017-03-24: 5 [IU] via SUBCUTANEOUS
  Administered 2017-03-25 (×2): 2 [IU] via SUBCUTANEOUS
  Administered 2017-03-26: 3 [IU] via SUBCUTANEOUS
  Administered 2017-03-26: 1 [IU] via SUBCUTANEOUS

## 2017-03-17 MED ORDER — LIP MEDEX EX OINT
TOPICAL_OINTMENT | CUTANEOUS | Status: AC
Start: 2017-03-17 — End: 2017-03-17
  Administered 2017-03-17: 1
  Filled 2017-03-17: qty 7

## 2017-03-17 MED ORDER — FUROSEMIDE 10 MG/ML IJ SOLN
40.0000 mg | Freq: Once | INTRAMUSCULAR | Status: AC
  Administered 2017-03-17: 40 mg via INTRAVENOUS
  Filled 2017-03-17: qty 4

## 2017-03-17 MED ORDER — SODIUM CHLORIDE 0.9 % IV SOLN
INTRAVENOUS | Status: DC
Start: 2017-03-17 — End: 2017-03-17
  Administered 2017-03-17: 08:00:00 via INTRAVENOUS

## 2017-03-17 NOTE — NC FL2 (Signed)
[place] MEDICAID FL2 LEVEL OF CARE SCREENING TOOL     IDENTIFICATION  Patient Name: Rebecca [name] Birthdate: [birth year] Sex: female Admission Date (Current Location): 03/14/2017  [facility] and [place] Number:  Herbalist and Address:  [facility],  [address] [place], [place]      Provider Number: [id number]  Attending Physician Name and Address:  Eugenie Filler, MD  Relative Name and Phone Number:       Current Level of Care: Hospital Recommended Level of Care: [place] Prior Approval Number:    Date Approved/Denied:   PASRR Number:    Discharge Plan: SNF    Current Diagnoses: Patient Active Problem List   Diagnosis Date Noted  . Acute right hip pain 03/14/2017  . Physical debility 03/08/2017  . Recurrent UTI 03/08/2017  . Ascites 03/08/2017  . Chronic pain disorder 03/07/2017  . Chronic constipation   . Community acquired pneumonia   . Palliative care encounter   . Encounter for hospice care discussion   . TIA (transient ischemic attack) 01/27/2017  . Pancreatic mass 01/27/2017  . Aortic atherosclerosis ([place]) 01/27/2017  . Hyponatremia 01/25/2017  . Generalized weakness 01/25/2017  . Fatigue 01/11/2017  . Mixed hyperlipidemia 12/02/2016  . Encephalopathy 11/20/2016  . Altered mental status 11/19/2016  . Aspiration pneumonia of both lower lobes due to gastric secretions ([place]) 10/10/2016  . Pneumonia 10/07/2016  . Lobar pneumonia, unspecified organism ([place]) 10/07/2016  . Ileus ([place]) 10/07/2016  . Healthcare-associated pneumonia 09/12/2016  . AKI (acute kidney injury) ([place]) 09/12/2016  . HAP (hospital-acquired pneumonia) 09/12/2016  . Respiratory distress 09/12/2016  . Pressure injury of skin 07/15/2016  . Essential hypertension 07/14/2016  . Acute encephalopathy 07/14/2016  . Coronary artery disease involving native coronary artery of native heart without angina pectoris 05/14/2016  . CKD stage 4, GFR  15-29 ml/min  08/11/2015  . 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 Leukocytosis 01/28/2014  . History of TIA - May 2015- Plavix 10/16/2013  . Polymyalgia rheumatica ([place]) 10/03/2012  . Chronic diastolic (congestive) heart failure ([place]) 07/15/2012  . Trochanteric bursitis of right hip 04/11/2012  . Dysuria 08/30/2011  . History of small bowel obstruction 11/04/2010  . Schizoaffective disorder ([place]) 01/10/2008  . Dyslipidemia 01/25/2007  . Hypothyroidism 08/11/2006  . Anemia in chronic renal disease 08/11/2006  . GASTROESOPHAGEAL REFLUX, NO ESOPHAGITIS 08/11/2006    Orientation RESPIRATION BLADDER Height & Weight     Self, Situation, Place  Normal Incontinent Weight: 149 lb 11.1 oz (67.9 kg) Height:  5\' 3"  (160 cm)  BEHAVIORAL SYMPTOMS/MOOD NEUROLOGICAL BOWEL NUTRITION STATUS      Continent  (pureed, heart healthy)  AMBULATORY STATUS COMMUNICATION OF NEEDS Skin   Extensive Assist Verbally Normal                       Personal Care Assistance Level of Assistance  Bathing, Feeding, Dressing Bathing Assistance: Limited assistance Feeding assistance: Limited assistance Dressing Assistance: Limited assistance     Functional Limitations Info  Sight, Hearing, Speech Sight Info: Adequate Hearing Info: Adequate Speech Info: Adequate    SPECIAL CARE FACTORS FREQUENCY  PT (By licensed PT), OT (By licensed OT)     PT Frequency: 5x OT Frequency: 5x            Contractures Contractures Info: Not present    Additional Factors Info  Code Status, Allergies Code Status Info: full code Allergies Info: Levaquin Levofloxacin, Tape  Current Medications (03/17/2017):  This is the current hospital active medication list Current Facility-Administered Medications  Medication Dose Route Frequency Provider Last Rate Last Dose  . 0.9 %  sodium chloride infusion  250 mL Intravenous PRN Oswald Hillock, MD      . acetaminophen (TYLENOL) tablet  500 mg  500 mg Oral Q6H PRN Oswald Hillock, MD   500 mg at 03/16/17 1157  . aspirin EC tablet 81 mg  81 mg Oral Daily PRN Oswald Hillock, MD   81 mg at 03/15/17 1654  . clopidogrel (PLAVIX) tablet 75 mg  75 mg Oral Q breakfast Lavina Hamman, MD   75 mg at 03/17/17 0756  . cloZAPine (CLOZARIL) tablet 300 mg  300 mg Oral QHS Oswald Hillock, MD   300 mg at 03/16/17 2230  . diltiazem (CARDIZEM CD) 24 hr capsule 180 mg  180 mg Oral Daily Oswald Hillock, MD   180 mg at 03/17/17 1008  . enoxaparin (LOVENOX) injection 40 mg  40 mg Subcutaneous Q24H Oswald Hillock, MD   40 mg at 03/16/17 2230  . gabapentin (NEURONTIN) capsule 100 mg  100 mg Oral TID Lavina Hamman, MD   100 mg at 03/17/17 1528  . guaiFENesin (MUCINEX) 12 hr tablet 600 mg  600 mg Oral BID Oswald Hillock, MD   600 mg at 03/17/17 1008  . HYDROmorphone (DILAUDID) injection 0.5 mg  0.5 mg Intravenous Q3H PRN Lavina Hamman, MD   0.5 mg at 03/17/17 1346  . HYDROmorphone (DILAUDID) tablet 4 mg  4 mg Oral Q4H PRN Eugenie Filler, MD   4 mg at 03/17/17 0755  . insulin aspart (novoLOG) injection 0-5 Units  0-5 Units Subcutaneous QHS Eugenie Filler, MD      . insulin aspart (novoLOG) injection 0-9 Units  0-9 Units Subcutaneous TID WC Eugenie Filler, MD      . ipratropium-albuterol (DUONEB) 0.5-2.5 (3) MG/3ML nebulizer solution 3 mL  3 mL Inhalation BID Eugenie Filler, MD   3 mL at 03/16/17 1943  . ipratropium-albuterol (DUONEB) 0.5-2.5 (3) MG/3ML nebulizer solution 3 mL  3 mL Nebulization Q4H PRN Lavina Hamman, MD      . isosorbide mononitrate (IMDUR) 24 hr tablet 60 mg  60 mg Oral Daily Oswald Hillock, MD   60 mg at 03/16/17 0955  . [START ON 03/18/2017] levothyroxine (SYNTHROID, LEVOTHROID) tablet 75 mcg  75 mcg Oral QAC breakfast Eugenie Filler, MD      . loratadine (CLARITIN) tablet 10 mg  10 mg Oral Daily Oswald Hillock, MD   10 mg at 03/17/17 1008  . metoprolol succinate (TOPROL-XL) 24 hr tablet 100 mg  100 mg Oral Q breakfast  Oswald Hillock, MD   100 mg at 03/17/17 0756  . mometasone-formoterol (DULERA) 200-5 MCG/ACT inhaler 2 puff  2 puff Inhalation BID Oswald Hillock, MD   2 puff at 03/16/17 1943  . morphine (MS CONTIN) 12 hr tablet 15 mg  15 mg Oral Q12H Eugenie Filler, MD   15 mg at 03/17/17 1008  . ondansetron (ZOFRAN) tablet 4 mg  4 mg Oral Q6H PRN Oswald Hillock, MD       Or  . ondansetron (ZOFRAN) injection 4 mg  4 mg Intravenous Q6H PRN Oswald Hillock, MD      . pantoprazole (PROTONIX) EC tablet 40 mg  40 mg Oral Daily Oswald Hillock, MD   [phone]  mg at 03/17/17 1008  . polyethylene glycol (MIRALAX / GLYCOLAX) packet 17 g  17 g Oral BID Oswald Hillock, MD   17 g at 03/17/17 1008  . pravastatin (PRAVACHOL) tablet 20 mg  20 mg Oral QHS Oswald Hillock, MD   20 mg at 03/16/17 2230  . predniSONE (DELTASONE) tablet 10 mg  10 mg Oral Q breakfast Oswald Hillock, MD   10 mg at 03/17/17 0756  . protein supplement (PREMIER PROTEIN) liquid  2 oz Oral Daily PRN Lavina Hamman, MD      . senna-docusate (Senokot-S) tablet 1 tablet  1 tablet Oral BID Eugenie Filler, MD   1 tablet at 03/17/17 1008  . sodium chloride flush (NS) 0.9 % injection 3 mL  3 mL Intravenous Q12H Oswald Hillock, MD   3 mL at 03/17/17 1018  . sodium chloride flush (NS) 0.9 % injection 3 mL  3 mL Intravenous PRN Oswald Hillock, MD      . temazepam (RESTORIL) capsule 15 mg  15 mg Oral QHS PRN Oswald Hillock, MD   15 mg at 03/15/17 2054  . tiotropium (SPIRIVA) inhalation capsule 18 mcg  18 mcg Inhalation Daily Lavina Hamman, MD   18 mcg at 03/16/17 3151     Discharge Medications: Please see discharge summary for a list of discharge medications.  Relevant Imaging Results:  Relevant Lab Results:   Additional Information SS# [id number]  Nila Nephew, LCSW

## 2017-03-17 NOTE — Progress Notes (Signed)
OT Cancellation Note  Patient Details Name: Rebecca [name] MRN: [id number] DOB: 1941-04-12   Cancelled Treatment:    Reason Eval/Treat Not Completed: Other (comment).  Pt just back to bed. Will check later as schedule permits.  Katlynn Naser 03/17/2017, 1:21 PM  Lesle Chris, OTR/L [phone] 03/17/2017

## 2017-03-17 NOTE — Progress Notes (Signed)
PROGRESS NOTE    Rebecca [name]  CBJ:[id number] DOB: 07/31/1940 DOA: 03/14/2017 PCP: [name], MD   Brief Narrative:  Pt. with PMH of COPD, chronic prednisone therapy, CAD status post DES to RCA in 2006, CKD stage IV, diastolic heart failure, MGUS, hypothyroidism ; admitted on 03/14/2017, presented with complaint of right hip pain, was found to have muscular pain. Currently further plan is pain control.   Assessment & Plan:   Principal Problem:   Acute right hip pain Active Problems:   Hypothyroidism   Dyslipidemia   Anemia in chronic renal disease   GASTROESOPHAGEAL REFLUX, NO ESOPHAGITIS   Chronic diastolic (congestive) heart failure (HCC)   Polymyalgia rheumatica (HCC)   MGUS (monoclonal gammopathy of unknown significance)   CKD stage 4, GFR 15-29 ml/min    Essential hypertension   AKI (acute kidney injury) (HCC)   Pneumonia   Chronic constipation   Chronic pain disorder   #1 right hip pain Likely secondary to L5-S1 spondylosis seen on MRI of the L-spine in 2015 with muscular pain in the gluteal region. CT right hip which was done 03/15/2017 with no acute osseous abnormalities, mild degenerative changes of the right hip joint with questionable chondrocalcinosis which could be age-related or reflect CPPD arthropathy. Patient still with significant pain and stated IV Dilaudid helps her pain however only lasted about 2 hours. Per family patient's oral Dilaudid dose was increased to 4 mg as needed per her hematologist/oncologist just prior to admission. Per nursing patient slept all night. Patient's pain seems to be under better control. Continue long-acting MS Contin 15 mg twice daily, Dilaudid 4 mg every 4 hours as needed for breakthrough pain. Continue Neurontin, IV Dilaudid when necessary severe pain. It is noted that patient scheduled to see an interventional pain doctors as an outpatient for her back pain and may benefit from a spinal/steroid injection. PT/OT.  #2  healthcare associated pneumonia----ruled out Chest x-ray obtained on admission had some concerns for possible pneumonia. Patient was seen in consultation by ID patient clinically does not have a pneumonia. Blood cultures obtained with no growth to date. IV antibiotics were discontinued by ID recommendations. Patient afebrile. Patient with a chronic leukocytosis. Follow.  #3 repeated UTIs/colonization Patient has had multiple rounds of antibiotics for UTIs last urine culture 03/04/2017 showing pseudomonas aeruginosa. Patient with a chronically elevated leukocytosis on chronic prednisone therapy. Urine cultures positive for Pseudomonas. Patient has been seen in consultation by ID who feel that patient likely has colonization rather than a true acute infection. IV antibiotics have been discontinued. Monitor.  #4 concerning for ascites--ruled out It was noted that patient was supposed to get a paracentesis as outpatient ordered per patient's oncologist. Patient daughter had insisted that ultrasound and paracentesis be done during this hospitalization and as such abdominal ultrasound was ordered which was negative for ascites. Outpatient follow-up.  #5 coronary artery disease Patient currently asymptomatic. Denies any chest pain or shortness of breath. Continue current regimen of aspirin and Plavix, Cardizem CD, Imdur, Toprol-XL, statin. Patient was to get a spinal injection in the outpatient setting Plavix will likely need to be discontinued at that time however will defer to pain management doctor at this time.  #6 COPD/polymyalgia rheumatica Stable. Continue home regimen of prednisone 10 mg daily. Patient on chronic prednisone and increased risk for avascular necrosis, pathologic fracture, osteoporosis. Continue Motrin as needed.  #7 MGUS Outpatient follow-up with hematology/oncology.  #8 hypertension Blood pressure stable. Continue current regimen of Cardizem CD,imdur, Toprol-XL.  #9  constipation Continue current regimen of MiraLAX twice daily, Senokot S twice daily.  #10 dysphagia During last admission speech therapy evaluated the patient and recommended dysphagia 3 diet. It is noted that patient's daughter states patient on a regular diet at home. Patient tolerating current diet of pured food which per nursing daughter orders. Follow.  #11 hyponatremia Patient with a worsening hyponatremia. Questionable etiology. Will check a urine sodium, urine creatinine, urine and serum osmolality, check TSH. Will place patient on IV fluids and repeat lab work this afternoon. If hyponatremia is worsening will saline lock IV fluids and give a dose of IV Lasix and follow.   DVT prophylaxis: Lovenox Code Status: DO NOT RESUSCITATE Family Communication: Updated patient and daughter at bedside. Disposition Plan: Likely skilled nursing facility versus home with home health when pain has improved and better controlled.   Consultants:   Infectious disease: Dr.Comer 03/15/2017  Procedures:   CT right hip 03/15/2017  Chest x-ray 03/14/2017  Abdominal ultrasound 03/15/2017  Plain films of the right hip and pelvis 03/14/2017  Antimicrobials:   IV vancomycin 03/14/2017>>>>> 03/15/2017  IV Zosyn 03/14/2017>>>> 03/15/2017   Subjective: Patient still with complaints of right hip pain however states some improvement with right hip pain since yesterday after long acting MS Contin was started. Patient denies any chest pain or shortness of breath. Patient denies any nausea or vomiting.  Objective: Vitals:   03/17/17 0104 03/17/17 0439 03/17/17 0755 03/17/17 1608  BP: (!) 146/66 (!) 148/73 (!) 160/65 (!) 154/77  Pulse: 74 76 75 74  Resp: 18 18  17   Temp:  98.3 F (36.8 C)  99 F (37.2 C)  TempSrc: Oral Axillary  Oral  SpO2: 97% 98%  97%  Weight:      Height:        Intake/Output Summary (Last 24 hours) at 03/17/17 1642 Last data filed at 03/17/17 1526  Gross per 24 hour   Intake          1921.33 ml  Output                0 ml  Net          1921.33 ml   Filed Weights   03/14/17 1509  Weight: 63 kg (139 lb)    Examination:  General exam: Appears calm and comfortable.  Respiratory system: Some diffuse coarse breath sounds. No wheezes. Decreased breath sounds in the bases. Respiratory effort normal. Cardiovascular system: S1 & S2 heard, RRR. No JVD, murmurs, rubs, gallops or clicks. No pedal edema. Gastrointestinal system: Abdomen is nondistended, soft and nontender. No organomegaly or masses felt. Normal bowel sounds heard. Central nervous system: Alert and oriented. No focal neurological deficits. Extremities: Symmetric 5 x 5 power. Skin: No rashes, lesions or ulcers Psychiatry: Judgement and insight appear normal. Mood & affect appropriate.     Data Reviewed: I have personally reviewed following labs and imaging studies  CBC:  Recent Labs Lab 03/14/17 1344 03/15/17 0404 03/16/17 0353  WBC 15.5* 13.4* 18.6*  NEUTROABS 12.8*  --  14.4*  HGB 10.3* 9.5* 9.0*  HCT 29.6* 27.6* 26.0*  MCV 87.6 86.8 89.3  PLT 230 220 433   Basic Metabolic Panel:  Recent Labs Lab 03/14/17 1344 03/15/17 0404 03/16/17 0353 03/17/17 0346 03/17/17 1435  NA 130* 130* 130* 127* 125*  K 4.5 4.8 4.7 4.6 5.1  CL 94* 96* 98* 94* 93*  CO2 25 26 23 25  21*  GLUCOSE 120* 115* 92 115* 190*  BUN 31*  31* 30* 24* 22*  CREATININE 1.13* 1.17* 1.11* 1.02* 0.98  CALCIUM 9.5 9.2 8.8* 8.7* 8.7*   GFR: Estimated Creatinine Clearance: 42.3 mL/min (by C-G formula based on SCr of 0.98 mg/dL). Liver Function Tests:  Recent Labs Lab 03/14/17 1344 03/15/17 0404 03/16/17 0353  AST 14* 14* 15  ALT 14 13* 12*  ALKPHOS 42 39 31*  BILITOT 0.3 0.4 0.3  PROT 6.3* 5.8* 5.5*  ALBUMIN 3.4* 3.0* 2.8*   No results for input(s): LIPASE, AMYLASE in the last 168 hours. No results for input(s): AMMONIA in the last 168 hours. Coagulation Profile: No results for input(s): INR,  PROTIME in the last 168 hours. Cardiac Enzymes: No results for input(s): CKTOTAL, [name], [name], TROPONINI in the last 168 hours. BNP (last 3 results) No results for input(s): PROBNP in the last 8760 hours. HbA1C: No results for input(s): HGBA1C in the last 72 hours. CBG: No results for input(s): GLUCAP in the last 168 hours. Lipid Profile: No results for input(s): CHOL, [name], [name], [name], [name], LDLDIRECT in the last 72 hours. Thyroid Function Tests:  Recent Labs  03/17/17 0924  TSH 0.898   Anemia Panel: No results for input(s): VITAMINB12, FOLATE, FERRITIN, TIBC, IRON, RETICCTPCT in the last 72 hours. Sepsis Labs:  Recent Labs Lab 03/14/17 1359  LATICACIDVEN 0.55    Recent Results (from the past 240 hour(s))  Blood culture (routine x 2)     Status: None (Preliminary result)   Collection Time: 03/14/17  1:45 PM  Result Value Ref Range Status   Specimen Description BLOOD LEFT ANTECUBITAL  Final   Special Requests   Final    BOTTLES DRAWN AEROBIC AND ANAEROBIC Blood Culture adequate volume   Culture   Final    NO GROWTH 3 DAYS Performed at [facility], [address], [place], [place] [address]    Report Status PENDING  Incomplete  Blood culture (routine x 2)     Status: None (Preliminary result)   Collection Time: 03/14/17  1:48 PM  Result Value Ref Range Status   Specimen Description BLOOD RIGHT ANTECUBITAL  Final   Special Requests   Final    BOTTLES DRAWN AEROBIC AND ANAEROBIC Blood Culture adequate volume   Culture   Final    NO GROWTH 3 DAYS Performed at [facility], [place] [address] [place], [place] [address]    Report Status PENDING  Incomplete  Urine culture     Status: Abnormal   Collection Time: 03/14/17  2:44 PM  Result Value Ref Range Status   Specimen Description URINE, CATHETERIZED  Final   Special Requests NONE  Final   Culture >=100,000 COLONIES/mL PSEUDOMONAS AERUGINOSA (A)  Final   Report Status 03/16/2017 FINAL  Final    Organism ID, Bacteria PSEUDOMONAS AERUGINOSA (A)  Final      Susceptibility   Pseudomonas aeruginosa - MIC*    CEFTAZIDIME 4 SENSITIVE Sensitive     CIPROFLOXACIN <=0.25 SENSITIVE Sensitive     GENTAMICIN <=1 SENSITIVE Sensitive     IMIPENEM 2 SENSITIVE Sensitive     PIP/TAZO 8 SENSITIVE Sensitive     CEFEPIME 2 SENSITIVE Sensitive     * >=100,000 COLONIES/mL PSEUDOMONAS AERUGINOSA         Radiology Studies: No results found.      Scheduled Meds: . clopidogrel  75 mg Oral Q breakfast  . cloZAPine  300 mg Oral QHS  . diltiazem  180 mg Oral Daily  . enoxaparin (LOVENOX) injection  40 mg Subcutaneous Q24H  . gabapentin  100 mg Oral TID  . guaiFENesin  600 mg Oral BID  . insulin aspart  0-5 Units Subcutaneous QHS  . insulin aspart  0-9 Units Subcutaneous TID WC  . ipratropium-albuterol  3 mL Inhalation BID  . isosorbide mononitrate  60 mg Oral Daily  . [START ON 03/18/2017] levothyroxine  75 mcg Oral QAC breakfast  . loratadine  10 mg Oral Daily  . metoprolol succinate  100 mg Oral Q breakfast  . mometasone-formoterol  2 puff Inhalation BID  . morphine  15 mg Oral Q12H  . pantoprazole  40 mg Oral Daily  . polyethylene glycol  17 g Oral BID  . pravastatin  20 mg Oral QHS  . predniSONE  10 mg Oral Q breakfast  . senna-docusate  1 tablet Oral BID  . sodium chloride flush  3 mL Intravenous Q12H  . tiotropium  18 mcg Inhalation Daily   Continuous Infusions: . sodium chloride       LOS: 1 day    Time spent: 60 minutes    THOMPSON,DANIEL, MD [facility] Pager [phone]  If 7PM-7AM, please contact night-coverage [email or web address] Password [email or web address] 03/17/2017, 4:42 PM

## 2017-03-17 NOTE — Progress Notes (Signed)
Physical Therapy Treatment Patient Details Name: Rebecca [name] MRN: [id number] DOB: 1941/05/29 Today's Date: 03/17/2017    History of Present Illness PMH of COPD, chronic prednisone therapy, CAD, CKD stage IV, diastolic heart failure, MGUS, hypothyroidism, CVA, polymyalgia rheumatica; admitted on 03/14/2017, presented with complaint of right hip pain (likely combination from L5-S1 spondylosis, seen on MRI of lumbar spine in 2015 and muscular pain in gluteal region)    PT Comments    Pt tolerates only minimal activity due to R hip pain. She attempted to stand 3 times but couldn't tolerate standing more than 1 second with RW. Performed seated and supine BLE/UE exercises, pt reported R hip pain with exercises as well, despite having pain medication prior to PT.    Follow Up Recommendations  SNF     Equipment Recommendations  None recommended by PT    Recommendations for Other Services       Precautions / Restrictions Precautions Precautions: Fall Restrictions Weight Bearing Restrictions: No    Mobility  Bed Mobility               General bed mobility comments: up in recliner  Transfers Overall transfer level: Needs assistance Equipment used: Rolling walker (2 wheeled) Transfers: Sit to/from Stand Sit to Stand: Min guard         General transfer comment: VCs hand placement, sit to stand x 3 trials, pt stood very briefly (~ 1 second) each trial then sat abruptly due to R hip pain  Ambulation/Gait                 Stairs            Wheelchair Mobility    Modified Rankin (Stroke Patients Only)       Balance                                            Cognition Arousal/Alertness: Awake/alert Behavior During Therapy: WFL for tasks assessed/performed Overall Cognitive Status: Within Functional Limits for tasks assessed                                        Exercises General Exercises - Lower Extremity Ankle  Circles/Pumps: AROM;Both;20 reps;Supine Short Arc Quad: AAROM;Both;10 reps Long Arc Quad: AROM;Both;10 reps;Seated Heel Slides: AAROM;Both;5 reps;Supine Hip Flexion/Marching: AROM;Both;5 reps;Seated Shoulder Exercises Shoulder Flexion: AROM;Both;10 reps;Seated    General Comments        Pertinent Vitals/Pain Pain Assessment: Faces Faces Pain Scale: Hurts whole lot Pain Location: R hip/groin with activity Pain Intervention(s): Monitored during session;Limited activity within patient's tolerance;Premedicated before session    Home Living                      Prior Function            PT Goals (current goals can now be found in the care plan section) Acute Rehab PT Goals PT Goal Formulation: With patient/family Time For Goal Achievement: 03/30/17 Potential to Achieve Goals: Good Progress towards PT goals: Progressing toward goals    Frequency    Min 3X/week      PT Plan Current plan remains appropriate    Co-evaluation              AM-PAC PT "6 Clicks" Daily Activity  Outcome Measure  Difficulty turning over in bed (including adjusting bedclothes, sheets and blankets)?: A Lot Difficulty moving from lying on back to sitting on the side of the bed? : Unable Difficulty sitting down on and standing up from a chair with arms (e.g., wheelchair, bedside commode, etc,.)?: Unable Help needed moving to and from a bed to chair (including a wheelchair)?: A Little Help needed walking in hospital room?: Total Help needed climbing 3-5 steps with a railing? : Total 6 Click Score: 9    End of Session Equipment Utilized During Treatment: Gait belt Activity Tolerance: Patient limited by fatigue;Patient limited by pain Patient left: in chair;with call bell/phone within reach;with family/visitor present Nurse Communication: Mobility status PT Visit Diagnosis: Difficulty in walking, not elsewhere classified (R26.2);Pain Pain - Right/Left: Right Pain - part of body:  Leg     Time: 6546-5035 PT Time Calculation (min) (ACUTE ONLY): 24 min  Charges:  $Therapeutic Exercise: 8-22 mins $Therapeutic Activity: 8-22 mins                    G Codes:          Blondell Reveal Kistler 03/17/2017, 3:12 PM [phone]

## 2017-03-17 NOTE — Progress Notes (Signed)
OT Cancellation Note  Patient Details Name: Rebecca [name] MRN: [id number] DOB: 01-28-1941   Cancelled Treatment:    Reason Eval/Treat Not Completed: Other (comment). Pt is working with PT at this time  [facility] 03/17/2017, 2:46 PM  Lesle Chris, OTR/L [phone] 03/17/2017

## 2017-03-17 NOTE — Consult Note (Addendum)
   [facility] CM Inpatient Consult   03/17/2017  Rebecca [name] 09/29/1940 [id number]   Screened for [facility] Care Management program due to multiple hospitalizations.   Spoke briefly with Rebecca [name] at bedside about [facility] Management program services. She states "that sounds good but I am going to rehab after I leave here for about 2 weeks". States "you will need to talk to my daughter".   Daughter not at bedside at the time. Left [facility] Care Management brochure and contact information at bedside for Rebecca [name]'s daughter to review.   Also, writer does not want to confuse daughter or patient about [facility] Management follow up if current discharge plan is SNF.      Marthenia Rolling, MSN-Ed, RN,BSN [facility] Liaison [phone]

## 2017-03-18 DIAGNOSIS — N183 Chronic kidney disease, stage 3 (moderate): Secondary | ICD-10-CM

## 2017-03-18 DIAGNOSIS — D631 Anemia in chronic kidney disease: Secondary | ICD-10-CM

## 2017-03-18 LAB — BASIC METABOLIC PANEL
Anion gap: 8 (ref 5–15)
BUN: 25 mg/dL — AB (ref 6–20)
CALCIUM: 8.7 mg/dL — AB (ref 8.9–10.3)
CO2: 26 mmol/L (ref 22–32)
Chloride: 96 mmol/L — ABNORMAL LOW (ref 101–111)
Creatinine, Ser: 1.03 mg/dL — ABNORMAL HIGH (ref 0.44–1.00)
GFR calc Af Amer: 59 mL/min — ABNORMAL LOW (ref >60)
GFR, EST NON AFRICAN AMERICAN: 51 mL/min — AB (ref >60)
GLUCOSE: 95 mg/dL (ref 65–99)
Potassium: 4.1 mmol/L (ref 3.5–5.1)
Sodium: 130 mmol/L — ABNORMAL LOW (ref 135–145)

## 2017-03-18 LAB — GLUCOSE, CAPILLARY
GLUCOSE-CAPILLARY: 153 mg/dL — AB (ref 65–99)
Glucose-Capillary: 91 mg/dL (ref 65–99)
Glucose-Capillary: 100 mg/dL — ABNORMAL HIGH (ref 65–99)
Glucose-Capillary: 102 mg/dL — ABNORMAL HIGH (ref 65–99)
Glucose-Capillary: 203 mg/dL — ABNORMAL HIGH (ref 65–99)
Glucose-Capillary: 189 mg/dL — ABNORMAL HIGH (ref 65–99)

## 2017-03-18 MED ORDER — FUROSEMIDE 10 MG/ML IJ SOLN
20.0000 mg | Freq: Once | INTRAMUSCULAR | Status: AC
  Administered 2017-03-18: 20 mg via INTRAVENOUS
  Filled 2017-03-18: qty 2

## 2017-03-18 NOTE — Progress Notes (Addendum)
Obtained [facility] #[id number] [facility], pt's preference of SNFs, advised they are unable to offer pt bed.  Left voicemail for pt's daughter to discuss other bed offers.  Sharren Bridge, MSW, LCSW Clinical Social Work 03/18/2017 [phone]  Provided bed offers and pt's daughter visited facilities this afternoon- select [facility]. 2nd choice [facility] if no beds available at [facility]. Spoke with [facility] admissions- expect bed availability over the weekend- CSW provided pt's information in the [place]. Will assist with transition to SNF at DC.

## 2017-03-18 NOTE — Evaluation (Signed)
Occupational Therapy Evaluation Patient Details Name: Rebecca [name] MRN: [id number] DOB: 1940-10-26 Today's Date: 03/18/2017    History of Present Illness Pt was admitted for acute R hip pain.  PMH of COPD, chronic prednisone therapy, CAD, CKD stage IV, diastolic heart failure, MGUS, hypothyroidism, CVA, polymyalgia rheumatica; admitted on 03/14/2017, presented with complaint of right hip pain (likely combination from L5-S1 spondylosis, seen on MRI of lumbar spine in 2015 and muscular pain in gluteal region)   Clinical Impression   This 76 year old female was admitted for the above.  Prior to admission, she did have assistance with adls from daughter as needed. She is more limited at this time due to R hip pain.She will benefit from continued OT in acute and post acute. Goals in acute are for min to min guard A. She currently needs up to max A for LB adls and hygiene.    Follow Up Recommendations  SNF    Equipment Recommendations  3 in 1 bedside commode    Recommendations for Other Services       Precautions / Restrictions Precautions Precautions: Fall Restrictions Weight Bearing Restrictions: No      Mobility Bed Mobility         Supine to sit: Min guard        Transfers   Equipment used: Rolling walker (2 wheeled)   Sit to Stand: Min guard Stand pivot transfers: Min assist       General transfer comment: steadying assistance for transfers    Balance                                           ADL either performed or assessed with clinical judgement   ADL Overall ADL's : Needs assistance/impaired Eating/Feeding: Independent   Grooming: Set up;Sitting   Upper Body Bathing: Set up;Sitting   Lower Body Bathing: Moderate assistance;Sit to/from stand   Upper Body Dressing : Set up;Sitting   Lower Body Dressing: Maximal assistance;Sit to/from stand   Toilet Transfer: Minimal assistance;Stand-pivot;BSC;RW   Toileting- Clothing  Manipulation and Hygiene: Maximal assistance;Sit to/from stand         General ADL Comments: Pt used commode prior to returning to bed. C/O seat pinching her. She states she has been pinched before.  Marked out of service and called maintenance to look at it.  NT to look for another.  Daughter was present at the beginning of the session and asked me to lower the seat because it was too high.     Vision         Perception     Praxis      Pertinent Vitals/Pain Pain Assessment: Faces Faces Pain Scale: Hurts little more Pain Location: R hip Pain Descriptors / Indicators: Sore;Discomfort Pain Intervention(s): Limited activity within patient's tolerance;Monitored during session;Premedicated before session;Repositioned     Hand Dominance Right   Extremity/Trunk Assessment Upper Extremity Assessment Upper Extremity Assessment: Generalized weakness           Communication Communication Communication: No difficulties   Cognition Arousal/Alertness: Awake/alert Behavior During Therapy: WFL for tasks assessed/performed Overall Cognitive Status: Within Functional Limits for tasks assessed                                     General Comments  Exercises     Shoulder Instructions      Home Living Family/patient expects to be discharged to:: Skilled nursing facility Living Arrangements: Children Available Help at Discharge: Family                         Home Equipment: Shower seat;Walker - 2 wheels;Walker - 4 wheels;Hospital bed;Bedside commode          Prior Functioning/Environment Level of Independence: Independent with assistive device(s)        Comments: daughter assists with adls as needed, pt typically ambulatory without assistive device in home and uses RW for outside/community        OT Problem List: Decreased strength;Decreased activity tolerance;Impaired balance (sitting and/or standing);Decreased knowledge of use of DME or  AE;Pain      OT Treatment/Interventions: Self-care/ADL training;DME and/or AE instruction;Therapeutic activities;Patient/family education;Balance training    OT Goals(Current goals can be found in the care plan section) Acute Rehab OT Goals Patient Stated Goal: none stated OT Goal Formulation: With patient Time For Goal Achievement: 03/25/17 Potential to Achieve Goals: Good ADL Goals Pt Will Perform Grooming: standing;with min guard assist Pt Will Perform Lower Body Bathing: with min guard assist;with adaptive equipment;sit to/from stand Pt Will Transfer to Toilet: with min guard assist;ambulating;bedside commode Pt Will Perform Toileting - Clothing Manipulation and hygiene: with min assist;sit to/from stand  OT Frequency: Min 2X/week   Barriers to D/C:            Co-evaluation              AM-PAC PT "6 Clicks" Daily Activity     Outcome Measure Help from another person eating meals?: None Help from another person taking care of personal grooming?: A Little Help from another person toileting, which includes using toliet, bedpan, or urinal?: A Lot Help from another person bathing (including washing, rinsing, drying)?: A Lot Help from another person to put on and taking off regular upper body clothing?: A Little Help from another person to put on and taking off regular lower body clothing?: A Lot 6 Click Score: 16   End of Session Nurse Communication:  (3:1 commode)  Activity Tolerance: Patient limited by fatigue Patient left: in bed;with call bell/phone within reach;with bed alarm set  OT Visit Diagnosis: Pain;Muscle weakness (generalized) (M62.81) Pain - Right/Left: Right Pain - part of body: Hip                Time: 0923-3007 OT Time Calculation (min): 21 min Charges:  OT General Charges $OT Visit: 1 Visit OT Evaluation $OT Eval Low Complexity: 1 Low G-Codes:     [place], OTR/L [phone] 03/18/2017  Rebecca [name] 03/18/2017, 3:53 PM

## 2017-03-18 NOTE — Clinical Social Work Note (Signed)
Clinical Social Work Assessment  Patient Details  Name: Rebecca [name] MRN: [id number] Date of Birth: 11/29/1940  Date of referral:  03/18/17               Reason for consult:  Facility Placement                Permission sought to share information with:  Family Supports Permission granted to share information::  Yes, Verbal Permission Granted  Name::     daughter [name]  Agency::     Relationship::     Contact Information:     Housing/Transportation Living arrangements for the past 2 months:  [place] of Information:  Patient, Adult Children Patient Interpreter Needed:  None Criminal Activity/Legal Involvement Pertinent to Current Situation/Hospitalization:  No - Comment as needed Significant Relationships:  Adult Children Lives with:  Adult Children Do you feel safe going back to the place where you live?  Yes Need for family participation in patient care:  Yes (Comment) (daughter primary decision maker)  Care giving concerns:  Pt from home where she resides with her daughter. At baseline ambulates with walker and daughter assists her with everyday affairs. Daughter hopeful that pt could return directly home from hospital but after being present for physical therapy session agreed to SNF referrals for ST rehab due to decline in mobility. Notes pt has schizoaffective d.o which is well managed on current medication recommendations. Daughter states she helps pt with all care decisions as "she can get confused and not really comprehend the whole situation."  Facilities manager / plan:  CSW consulted to assist with SNF placement. Met with pt and daughter at bedside. Known to CSW from prior hospitalization. Pt was in SNF in April 2018 and daughter states "there was good and bad, she did well with the therapy piece but she got pneumonia again while there and came back in the hospital." Agrees that SNF at DC is currently their desired plan with goal of returning to  daughter's home after.  CSW explained pt was under observation status until 03/16/17 and daughter understands that 3 nights of meeting inpatient status is required for Medicare coverage for SNF. States private pay would not be option and should pt not meet that requirement, they would consider other DC plan.   *Completed PASSR request- went to manual review. CSW will follow and provide requested information in pursuit of PASSR authorization which pt will need in order to admit to a SNF*  Completed FL2 and made referrals to area facilities. Prefers [facility] if available.   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 Daughter hopeful and reasonable in expectations.  Emotional Assessment Appearance:  Appears younger than stated age Attitude/Demeanor/Rapport:   (pleasant) Affect (typically observed):  Accepting, Adaptable, Calm Orientation:  Oriented to Self, Oriented to Place, Oriented to  Time, Oriented to Situation Alcohol / Substance use:  Not Applicable Psych involvement (Current and /or in the community):  Yes (Comment) (OP provider)  Discharge Needs  Concerns to be addressed:  Discharge Planning Concerns Readmission within the last 30  days:  No Current discharge risk:  None Barriers to Discharge:  Continued Medical Work up   [employer], LCSW 03/18/2017, 8:41 AM  [phone]

## 2017-03-18 NOTE — Progress Notes (Signed)
Pt from home with the services of [facility]. Discharge plan is now for SNF. [facility] rep alerted of DC plan. Marney Doctor RN,BSN,NCM [phone]

## 2017-03-18 NOTE — Progress Notes (Signed)
PROGRESS NOTE    Rebecca [name]  YTK:[id number] DOB: [birth year] DOA: 03/14/2017 PCP: [name], MD   Brief Narrative:  Pt. with PMH of COPD, chronic prednisone therapy, CAD status post DES to RCA in 2006, CKD stage IV, diastolic heart failure, MGUS, hypothyroidism ; admitted on 03/14/2017, presented with complaint of right hip pain, was found to have muscular pain. Currently further plan is pain control.   Assessment & Plan:   Principal Problem:   Acute right hip pain Active Problems:   Hypothyroidism   Dyslipidemia   Anemia in chronic renal disease   GASTROESOPHAGEAL REFLUX, NO ESOPHAGITIS   Chronic diastolic (congestive) heart failure (HCC)   Polymyalgia rheumatica (HCC)   MGUS (monoclonal gammopathy of unknown significance)   CKD stage 4, GFR 15-29 ml/min    Essential hypertension   AKI (acute kidney injury) (HCC)   Pneumonia   Chronic constipation   Chronic pain disorder   #1 right hip pain Likely secondary to L5-S1 spondylosis seen on MRI of the L-spine in 2015 with muscular pain in the gluteal region. CT right hip which was done 03/15/2017 with no acute osseous abnormalities, mild degenerative changes of the right hip joint with questionable chondrocalcinosis which could be age-related or reflect CPPD arthropathy. Patient still with significant pain and stated IV Dilaudid helps her pain however only lasted about 2 hours. Per family patient's oral Dilaudid dose was increased to 4 mg as needed per her hematologist/oncologist just prior to admission. Per nursing patient slept all night. Patient's pain seems to be under better control. Continue long-acting MS Contin 15 mg twice daily, Dilaudid 4 mg every 4 hours as needed for breakthrough pain. Continue Neurontin, IV Dilaudid when necessary severe pain. It is noted that patient scheduled to see an interventional pain doctors as an outpatient for her back pain and may benefit from a spinal/steroid injection. PT/OT.  #2  healthcare associated pneumonia----ruled out Chest x-ray obtained on admission had some concerns for possible pneumonia. Patient was seen in consultation by ID patient clinically does not have a pneumonia. Blood cultures obtained with no growth to date. IV antibiotics were discontinued by ID recommendations. Patient afebrile. Patient with a chronic leukocytosis. Follow.  #3 repeated UTIs/colonization Patient has had multiple rounds of antibiotics for UTIs last urine culture 03/04/2017 showing pseudomonas aeruginosa. Patient with a chronically elevated leukocytosis on chronic prednisone therapy. Urine cultures positive for Pseudomonas. Patient has been seen in consultation by ID who feel that patient likely has colonization rather than a true acute infection. IV antibiotics have been discontinued. Monitor.  #4 concerning for ascites--ruled out It was noted that patient was supposed to get a paracentesis as outpatient ordered per patient's oncologist. Patient daughter had insisted that ultrasound and paracentesis be done during this hospitalization and as such abdominal ultrasound was ordered which was negative for ascites. Outpatient follow-up.  #5 coronary artery disease Patient asymptomatic. Patient with no shortness of breath or chest pain. Continue current regimen of aspirin and Plavix, Cardizem CD, Imdur, Toprol-XL, statin. Patient was to get a spinal injection in the outpatient setting Plavix will likely need to be discontinued at that time however will defer to pain management doctor at this time.  #6 COPD/polymyalgia rheumatica Stable. Continue home regimen of prednisone 10 mg daily. Patient on chronic prednisone and increased risk for avascular necrosis, pathologic fracture, osteoporosis. Continue Motrin as needed.  #7 MGUS Outpatient follow-up with hematology/oncology.  #8 hypertension Blood pressure stable. Continue current regimen of Cardizem CD,imdur, Toprol-XL.  #9  constipation Continue current regimen of MiraLAX twice daily, Senokot S twice daily.  #10 dysphagia During last admission speech therapy evaluated the patient and recommended dysphagia 3 diet. It is noted that patient's daughter states patient on a regular diet at home. Patient tolerating current diet of pured food which per nursing daughter orders. Follow.  #11 hyponatremia Patient with a worsening hyponatremia yesterday may have been secondary to hypervolemic hyponatremia.. Urine osmolality was 168 and low, urine sodium was 29, serum osmolality was decreased at 272. Sodium levels worsened after gentle hydration yesterday morning. IV fluids were discontinued. TSH within normal limits at 0.898. Patient given Lasix 40 mg IV 1 yesterday afternoon with improvement with hyponatremia. Sodium levels close to baseline. Will give another dose of Lasix 20 mg IV 1 today. Follow.   DVT prophylaxis: Lovenox Code Status: DO NOT RESUSCITATE Family Communication: Updated patient. No family at bedside.  Disposition Plan: Likely skilled nursing facility when bed available.    Consultants:   Infectious disease: Dr.Comer 03/15/2017  Procedures:   CT right hip 03/15/2017  Chest x-ray 03/14/2017  Abdominal ultrasound 03/15/2017  Plain films of the right hip and pelvis 03/14/2017  Antimicrobials:   IV vancomycin 03/14/2017>>>>> 03/15/2017  IV Zosyn 03/14/2017>>>> 03/15/2017   Subjective: Patient complaining bed. Patient states she doesn't feel too well however states that every day. Patient states some improvement in terms of pain management since admission area patient status was able to sleep all night without waking up with significant pain.   Objective: Vitals:   03/18/17 0321 03/18/17 0412 03/18/17 0542 03/18/17 0832  BP: 100/81 103/78    Pulse: 60 67    Resp: 18 16    Temp: 97.9 F (36.6 C) 98 F (36.7 C)    TempSrc: Axillary Oral    SpO2: 100% 100%  (!) 0%  Weight:   68 kg (149  lb 14.6 oz)   Height:        Intake/Output Summary (Last 24 hours) at 03/18/17 1200 Last data filed at 03/18/17 1058  Gross per 24 hour  Intake          2183.33 ml  Output              500 ml  Net          1683.33 ml   Filed Weights   03/14/17 1509 03/17/17 1608 03/18/17 0542  Weight: 63 kg (139 lb) 67.9 kg (149 lb 11.1 oz) 68 kg (149 lb 14.6 oz)    Examination:  General exam: Appears calm and comfortable.  Respiratory system: Less coarse breath sounds. No wheezes. Decreased breath sounds in the bases. Respiratory effort normal. Cardiovascular system: S1 & S2 heard, RRR. No JVD, murmurs, rubs, gallops or clicks. No pedal edema. Gastrointestinal system: Abdomen is nondistended, soft and nontender. No organomegaly or masses felt. Normal bowel sounds heard. Central nervous system: Alert and oriented. No focal neurological deficits. Extremities: Symmetric 5 x 5 power. Skin: No rashes, lesions or ulcers Psychiatry: Judgement and insight appear normal. Mood & affect appropriate.     Data Reviewed: I have personally reviewed following labs and imaging studies  CBC:  Recent Labs Lab 03/14/17 1344 03/15/17 0404 03/16/17 0353  WBC 15.5* 13.4* 18.6*  NEUTROABS 12.8*  --  14.4*  HGB 10.3* 9.5* 9.0*  HCT 29.6* 27.6* 26.0*  MCV 87.6 86.8 89.3  PLT 230 220 017   Basic Metabolic Panel:  Recent Labs Lab 03/15/17 0404 03/16/17 0353 03/17/17 0346 03/17/17 1435 03/18/17 0351  NA 130* 130* 127* 125* 130*  K 4.8 4.7 4.6 5.1 4.1  CL 96* 98* 94* 93* 96*  CO2 26 23 25  21* 26  GLUCOSE 115* 92 115* 190* 95  BUN 31* 30* 24* 22* 25*  CREATININE 1.17* 1.11* 1.02* 0.98 1.03*  CALCIUM 9.2 8.8* 8.7* 8.7* 8.7*   GFR: Estimated Creatinine Clearance: 41.6 mL/min (A) (by C-G formula based on SCr of 1.03 mg/dL (H)). Liver Function Tests:  Recent Labs Lab 03/14/17 1344 03/15/17 0404 03/16/17 0353  AST 14* 14* 15  ALT 14 13* 12*  ALKPHOS 42 39 31*  BILITOT 0.3 0.4 0.3  PROT 6.3*  5.8* 5.5*  ALBUMIN 3.4* 3.0* 2.8*   No results for input(s): LIPASE, AMYLASE in the last 168 hours. No results for input(s): AMMONIA in the last 168 hours. Coagulation Profile: No results for input(s): INR, PROTIME in the last 168 hours. Cardiac Enzymes: No results for input(s): CKTOTAL, [name], [name], TROPONINI in the last 168 hours. BNP (last 3 results) No results for input(s): PROBNP in the last 8760 hours. HbA1C: No results for input(s): HGBA1C in the last 72 hours. CBG:  Recent Labs Lab 03/17/17 1825 03/17/17 2021 03/18/17 0314 03/18/17 0627 03/18/17 0728  GLUCAP 228* 209* 91 100* 102*   Lipid Profile: No results for input(s): CHOL, [name], [name], [name], [name], LDLDIRECT in the last 72 hours. Thyroid Function Tests:  Recent Labs  03/17/17 0924  TSH 0.898   Anemia Panel: No results for input(s): VITAMINB12, FOLATE, FERRITIN, TIBC, IRON, RETICCTPCT in the last 72 hours. Sepsis Labs:  Recent Labs Lab 03/14/17 1359  LATICACIDVEN 0.55    Recent Results (from the past 240 hour(s))  Blood culture (routine x 2)     Status: None (Preliminary result)   Collection Time: 03/14/17  1:45 PM  Result Value Ref Range Status   Specimen Description BLOOD LEFT ANTECUBITAL  Final   Special Requests   Final    BOTTLES DRAWN AEROBIC AND ANAEROBIC Blood Culture adequate volume   Culture   Final    NO GROWTH 3 DAYS Performed at [facility], [address] [place], [place] [address]    Report Status PENDING  Incomplete  Blood culture (routine x 2)     Status: None (Preliminary result)   Collection Time: 03/14/17  1:48 PM  Result Value Ref Range Status   Specimen Description BLOOD RIGHT ANTECUBITAL  Final   Special Requests   Final    BOTTLES DRAWN AEROBIC AND ANAEROBIC Blood Culture adequate volume   Culture   Final    NO GROWTH 3 DAYS Performed at [facility], [place] [address], [place],  [address]    Report Status PENDING  Incomplete  Urine culture      Status: Abnormal   Collection Time: 03/14/17  2:44 PM  Result Value Ref Range Status   Specimen Description URINE, CATHETERIZED  Final   Special Requests NONE  Final   Culture >=100,000 COLONIES/mL PSEUDOMONAS AERUGINOSA (A)  Final   Report Status 03/16/2017 FINAL  Final   Organism ID, Bacteria PSEUDOMONAS AERUGINOSA (A)  Final      Susceptibility   Pseudomonas aeruginosa - MIC*    CEFTAZIDIME 4 SENSITIVE Sensitive     CIPROFLOXACIN <=0.25 SENSITIVE Sensitive     GENTAMICIN <=1 SENSITIVE Sensitive     IMIPENEM 2 SENSITIVE Sensitive     PIP/TAZO 8 SENSITIVE Sensitive     CEFEPIME 2 SENSITIVE Sensitive     * >=100,000 COLONIES/mL  PSEUDOMONAS AERUGINOSA         Radiology Studies: No results found.      Scheduled Meds: . clopidogrel  75 mg Oral Q breakfast  . cloZAPine  300 mg Oral QHS  . diltiazem  180 mg Oral Daily  . enoxaparin (LOVENOX) injection  40 mg Subcutaneous Q24H  . gabapentin  100 mg Oral TID  . guaiFENesin  600 mg Oral BID  . insulin aspart  0-5 Units Subcutaneous QHS  . insulin aspart  0-9 Units Subcutaneous TID WC  . isosorbide mononitrate  60 mg Oral Daily  . levothyroxine  75 mcg Oral QAC breakfast  . loratadine  10 mg Oral Daily  . metoprolol succinate  100 mg Oral Q breakfast  . mometasone-formoterol  2 puff Inhalation BID  . morphine  15 mg Oral Q12H  . pantoprazole  40 mg Oral Daily  . polyethylene glycol  17 g Oral BID  . pravastatin  20 mg Oral QHS  . predniSONE  10 mg Oral Q breakfast  . senna-docusate  1 tablet Oral BID  . sodium chloride flush  3 mL Intravenous Q12H  . tiotropium  18 mcg Inhalation Daily   Continuous Infusions: . sodium chloride       LOS: 2 days    Time spent: 20 minutes    THOMPSON,DANIEL, MD [facility] Pager [phone]  If 7PM-7AM, please contact night-coverage [email or web address] Password [email or web address] 03/18/2017, 12:00 PM

## 2017-03-19 DIAGNOSIS — M25551 Pain in right hip: Secondary | ICD-10-CM

## 2017-03-19 LAB — BASIC METABOLIC PANEL
Anion gap: 7 (ref 5–15)
BUN: 23 mg/dL — ABNORMAL HIGH (ref 6–20)
CO2: 28 mmol/L (ref 22–32)
Calcium: 8.6 mg/dL — ABNORMAL LOW (ref 8.9–10.3)
Chloride: 92 mmol/L — ABNORMAL LOW (ref 101–111)
Creatinine, Ser: 1.03 mg/dL — ABNORMAL HIGH (ref 0.44–1.00)
GFR calc Af Amer: 59 mL/min — ABNORMAL LOW (ref >60)
GFR calc non Af Amer: 51 mL/min — ABNORMAL LOW (ref >60)
Glucose, Bld: 104 mg/dL — ABNORMAL HIGH (ref 65–99)
Potassium: 4.5 mmol/L (ref 3.5–5.1)
Sodium: 127 mmol/L — ABNORMAL LOW (ref 135–145)

## 2017-03-19 LAB — GLUCOSE, CAPILLARY
GLUCOSE-CAPILLARY: 136 mg/dL — AB (ref 65–99)
Glucose-Capillary: 94 mg/dL (ref 65–99)
Glucose-Capillary: 152 mg/dL — ABNORMAL HIGH (ref 65–99)
Glucose-Capillary: 227 mg/dL — ABNORMAL HIGH (ref 65–99)

## 2017-03-19 LAB — CULTURE, BLOOD (ROUTINE X 2)
CULTURE: NO GROWTH
CULTURE: NO GROWTH
SPECIAL REQUESTS: ADEQUATE
Special Requests: ADEQUATE

## 2017-03-19 MED ORDER — FUROSEMIDE 10 MG/ML IJ SOLN
40.0000 mg | Freq: Two times a day (BID) | INTRAMUSCULAR | Status: AC
  Administered 2017-03-19 (×2): 40 mg via INTRAVENOUS
  Filled 2017-03-19 (×2): qty 4

## 2017-03-19 MED ORDER — FUROSEMIDE 10 MG/ML IJ SOLN: 40.0000 mg | Freq: Once | INTRAMUSCULAR | Status: DC

## 2017-03-19 NOTE — Progress Notes (Signed)
PROGRESS NOTE    Rebecca [name]  HYQ:[id number] DOB: Feb 21, 1941 DOA: 03/14/2017 PCP: [name], MD   Brief Narrative:  Pt. with PMH of COPD, chronic prednisone therapy, CAD status post DES to RCA in 2006, CKD stage IV, diastolic heart failure, MGUS, hypothyroidism ; admitted on 03/14/2017, presented with complaint of right hip pain, was found to have muscular pain. Currently further plan is pain control.   Assessment & Plan:   Principal Problem:   Acute right hip pain Active Problems:   Hypothyroidism   Dyslipidemia   Anemia in chronic renal disease   GASTROESOPHAGEAL REFLUX, NO ESOPHAGITIS   Chronic diastolic (congestive) heart failure (HCC)   Polymyalgia rheumatica (HCC)   MGUS (monoclonal gammopathy of unknown significance)   CKD stage 4, GFR 15-29 ml/min    Essential hypertension   AKI (acute kidney injury) (HCC)   Pneumonia   Chronic constipation   Chronic pain disorder   Pain of right hip joint   #1 right hip pain Likely secondary to L5-S1 spondylosis seen on MRI of the L-spine in 2015 with muscular pain in the gluteal region. CT right hip which was done 03/15/2017 with no acute osseous abnormalities, mild degenerative changes of the right hip joint with questionable chondrocalcinosis which could be age-related or reflect CPPD arthropathy. Patient still with significant pain and stated IV Dilaudid helps her pain however only lasted about 2 hours. Per family patient's oral Dilaudid dose was increased to 4 mg as needed per her hematologist/oncologist just prior to admission. Per nursing patient slept all night. Patient's pain seems to be under better control. Continue long-acting MS Contin 15 mg twice daily, Dilaudid 4 mg every 4 hours as needed for breakthrough pain. Continue Neurontin, IV Dilaudid when necessary severe pain. It is noted that patient scheduled to see an interventional pain doctors as an outpatient for her back pain and may benefit from a spinal/steroid  injection. PT/OT.  #2 healthcare associated pneumonia----ruled out Chest x-ray obtained on admission had some concerns for possible pneumonia. Patient was seen in consultation by ID patient clinically does not have a pneumonia. Blood cultures obtained with no growth to date. IV antibiotics were discontinued by ID recommendations. Patient afebrile. Patient with a chronic leukocytosis. Follow.  #3 repeated UTIs/colonization Patient has had multiple rounds of antibiotics for UTIs last urine culture 03/04/2017 showing pseudomonas aeruginosa. Patient with a chronically elevated leukocytosis on chronic prednisone therapy. Urine cultures positive for Pseudomonas. Patient has been seen in consultation by ID who feel that patient likely has colonization rather than a true acute infection. IV antibiotics have been discontinued. Monitor.  #4 concerning for ascites--ruled out It was noted that patient was supposed to get a paracentesis as outpatient ordered per patient's oncologist. Patient daughter had insisted that ultrasound and paracentesis be done during this hospitalization and as such abdominal ultrasound was ordered which was negative for ascites. Outpatient follow-up.  #5 coronary artery disease Patient asymptomatic. Patient with no shortness of breath or chest pain. Continue current regimen of aspirin and Plavix, Cardizem CD, Imdur, Toprol-XL, statin. Patient was to get a spinal injection in the outpatient setting Plavix will likely need to be discontinued at that time however will defer to pain management doctor at this time.  #6 COPD/polymyalgia rheumatica Stable. Continue home regimen of prednisone 10 mg daily. Patient on chronic prednisone and increased risk for avascular necrosis, pathologic fracture, osteoporosis. Continue Motrin as needed.  #7 MGUS Outpatient follow-up with hematology/oncology.  #8 hypertension Blood pressure stable. Continue current  regimen of Cardizem CD,imdur, Toprol-XL.  Monitor closely with diuretics.  #9 constipation Continue current regimen of MiraLAX twice daily, Senokot S twice daily.  #10 dysphagia During last admission speech therapy evaluated the patient and recommended dysphagia 3 diet. It is noted that patient's daughter states patient on a regular diet at home. Patient tolerating current diet of pured food which per nursing daughter orders. Follow.  #11  hyponatremia ?? Chronic hyponatremia. Patient with a worsening hyponatremia may have been secondary to hypervolemic hyponatremia as hyponatremia improved with some diuresis. Urine osmolality was 168 and low, urine sodium was 29, serum osmolality was decreased at 272. Sodium levels worsened after gentle hydration. IV fluids were discontinued. TSH within normal limits at 0.898. Patient given Lasix 40 mg IV 1 03/17/2017 with some improvement with hyponatremia. Patient given Lasix 20 mg IV 1 03/18/2017 with a slight decrease in sodium levels. Patient current weight of 68 kg on 03/18/2017 from 63 kg on 03/14/2017. Will place patient on Lasix 40 mg IV every 12 hours. Strict I's and O's. Daily weights.    DVT prophylaxis: Lovenox Code Status: DO NOT RESUSCITATE Family Communication: Updated patient. No family at bedside.  Disposition Plan: Likely skilled nursing facility when bed available.    Consultants:   Infectious disease: Dr.Comer 03/15/2017  Procedures:   CT right hip 03/15/2017  Chest x-ray 03/14/2017  Abdominal ultrasound 03/15/2017  Plain films of the right hip and pelvis 03/14/2017  Antimicrobials:   IV vancomycin 03/14/2017>>>>> 03/15/2017  IV Zosyn 03/14/2017>>>> 03/15/2017   Subjective: Patient complaining of not feeling well every day. Patient however resting comfortably in the bed. Patient complaining of right hip pain. No chest pain. No shortness of breath. bed.   Objective: Vitals:   03/18/17 2040 03/19/17 0557 03/19/17 0827 03/19/17 1419  BP: 124/75 (!) 148/70   (!) 166/73  Pulse: 86 68  72  Resp: 16 16  16   Temp: 99.1 F (37.3 C) 97.7 F (36.5 C)  98.2 F (36.8 C)  TempSrc: Oral Axillary  Oral  SpO2: 97% 98% 94% 100%  Weight:      Height:        Intake/Output Summary (Last 24 hours) at 03/19/17 1732 Last data filed at 03/19/17 1430  Gross per 24 hour  Intake              840 ml  Output             1400 ml  Net             -560 ml   Filed Weights   03/14/17 1509 03/17/17 1608 03/18/17 0542  Weight: 63 kg (139 lb) 67.9 kg (149 lb 11.1 oz) 68 kg (149 lb 14.6 oz)    Examination:  General exam: Appears calm and comfortable.  Respiratory system: Some coarse breath sounds Diffusely. No wheezes. Decreased breath sounds in the bases. Respiratory effort normal. Cardiovascular system: Regular rate and rhythm. No murmurs rubs or gallops. No lower extremity edema.  Gastrointestinal system: Abdomen is nondistended, soft and nontender. No organomegaly or masses felt. Normal bowel sounds heard. Central nervous system: Alert and oriented. No focal neurological deficits. Extremities: Symmetric 5 x 5 power. Skin: No rashes, lesions or ulcers Psychiatry: Judgement and insight appear fair. Mood & affect appropriate.     Data Reviewed: I have personally reviewed following labs and imaging studies  CBC:  Recent Labs Lab 03/14/17 1344 03/15/17 0404 03/16/17 0353  WBC 15.5* 13.4* 18.6*  NEUTROABS 12.8*  --  14.4*  HGB 10.3* 9.5* 9.0*  HCT 29.6* 27.6* 26.0*  MCV 87.6 86.8 89.3  PLT 230 220 614   Basic Metabolic Panel:  Recent Labs Lab 03/16/17 0353 03/17/17 0346 03/17/17 1435 03/18/17 0351 03/19/17 0358  NA 130* 127* 125* 130* 127*  K 4.7 4.6 5.1 4.1 4.5  CL 98* 94* 93* 96* 92*  CO2 23 25 21* 26 28  GLUCOSE 92 115* 190* 95 104*  BUN 30* 24* 22* 25* 23*  CREATININE 1.11* 1.02* 0.98 1.03* 1.03*  CALCIUM 8.8* 8.7* 8.7* 8.7* 8.6*   GFR: Estimated Creatinine Clearance: 41.6 mL/min (A) (by C-G formula based on SCr of 1.03 mg/dL  (H)). Liver Function Tests:  Recent Labs Lab 03/14/17 1344 03/15/17 0404 03/16/17 0353  AST 14* 14* 15  ALT 14 13* 12*  ALKPHOS 42 39 31*  BILITOT 0.3 0.4 0.3  PROT 6.3* 5.8* 5.5*  ALBUMIN 3.4* 3.0* 2.8*   No results for input(s): LIPASE, AMYLASE in the last 168 hours. No results for input(s): AMMONIA in the last 168 hours. Coagulation Profile: No results for input(s): INR, PROTIME in the last 168 hours. Cardiac Enzymes: No results for input(s): CKTOTAL, [name], [name], TROPONINI in the last 168 hours. BNP (last 3 results) No results for input(s): PROBNP in the last 8760 hours. HbA1C: No results for input(s): HGBA1C in the last 72 hours. CBG:  Recent Labs Lab 03/18/17 1709 03/18/17 2044 03/19/17 0749 03/19/17 1202 03/19/17 1721  GLUCAP 203* 189* 94 136* 152*   Lipid Profile: No results for input(s): CHOL, [name], [name], [name], [name], LDLDIRECT in the last 72 hours. Thyroid Function Tests:  Recent Labs  03/17/17 0924  TSH 0.898   Anemia Panel: No results for input(s): VITAMINB12, FOLATE, FERRITIN, TIBC, IRON, RETICCTPCT in the last 72 hours. Sepsis Labs:  Recent Labs Lab 03/14/17 1359  LATICACIDVEN 0.55    Recent Results (from the past 240 hour(s))  Blood culture (routine x 2)     Status: None   Collection Time: 03/14/17  1:45 PM  Result Value Ref Range Status   Specimen Description BLOOD LEFT ANTECUBITAL  Final   Special Requests   Final    BOTTLES DRAWN AEROBIC AND ANAEROBIC Blood Culture adequate volume   Culture   Final    NO GROWTH 5 DAYS Performed at [facility], [address] [place], [place] [address]    Report Status 03/19/2017 FINAL  Final  Blood culture (routine x 2)     Status: None   Collection Time: 03/14/17  1:48 PM  Result Value Ref Range Status   Specimen Description BLOOD RIGHT ANTECUBITAL  Final   Special Requests   Final    BOTTLES DRAWN AEROBIC AND ANAEROBIC Blood Culture adequate volume   Culture   Final    NO  GROWTH 5 DAYS Performed at [facility], [place] [address], [place], [place] [address]    Report Status 03/19/2017 FINAL  Final  Urine culture     Status: Abnormal   Collection Time: 03/14/17  2:44 PM  Result Value Ref Range Status   Specimen Description URINE, CATHETERIZED  Final   Special Requests NONE  Final   Culture >=100,000 COLONIES/mL PSEUDOMONAS AERUGINOSA (A)  Final   Report Status 03/16/2017 FINAL  Final   Organism ID, Bacteria PSEUDOMONAS AERUGINOSA (A)  Final      Susceptibility   Pseudomonas aeruginosa - MIC*    CEFTAZIDIME 4 SENSITIVE Sensitive     CIPROFLOXACIN <=0.25 SENSITIVE Sensitive  GENTAMICIN <=1 SENSITIVE Sensitive     IMIPENEM 2 SENSITIVE Sensitive     PIP/TAZO 8 SENSITIVE Sensitive     CEFEPIME 2 SENSITIVE Sensitive     * >=100,000 COLONIES/mL PSEUDOMONAS AERUGINOSA         Radiology Studies: No results found.      Scheduled Meds: . clopidogrel  75 mg Oral Q breakfast  . cloZAPine  300 mg Oral QHS  . diltiazem  180 mg Oral Daily  . enoxaparin (LOVENOX) injection  40 mg Subcutaneous Q24H  . furosemide  40 mg Intravenous Q12H  . gabapentin  100 mg Oral TID  . guaiFENesin  600 mg Oral BID  . insulin aspart  0-5 Units Subcutaneous QHS  . insulin aspart  0-9 Units Subcutaneous TID WC  . isosorbide mononitrate  60 mg Oral Daily  . levothyroxine  75 mcg Oral QAC breakfast  . loratadine  10 mg Oral Daily  . metoprolol succinate  100 mg Oral Q breakfast  . mometasone-formoterol  2 puff Inhalation BID  . morphine  15 mg Oral Q12H  . pantoprazole  40 mg Oral Daily  . polyethylene glycol  17 g Oral BID  . pravastatin  20 mg Oral QHS  . predniSONE  10 mg Oral Q breakfast  . senna-docusate  1 tablet Oral BID  . sodium chloride flush  3 mL Intravenous Q12H  . tiotropium  18 mcg Inhalation Daily   Continuous Infusions: . sodium chloride       LOS: 3 days    Time spent: 70 minutes    Cleone Hulick, MD [facility] Pager  [phone]  If 7PM-7AM, please contact night-coverage [email or web address] Password [email or web address] 03/19/2017, 5:32 PM

## 2017-03-20 ENCOUNTER — Inpatient Hospital Stay (HOSPITAL_COMMUNITY): Payer: Medicare Other

## 2017-03-20 DIAGNOSIS — E8770 Fluid overload, unspecified: Secondary | ICD-10-CM

## 2017-03-20 DIAGNOSIS — R5383 Other fatigue: Secondary | ICD-10-CM

## 2017-03-20 LAB — GLUCOSE, CAPILLARY
GLUCOSE-CAPILLARY: 178 mg/dL — AB (ref 65–99)
GLUCOSE-CAPILLARY: 132 mg/dL — AB (ref 65–99)
Glucose-Capillary: 109 mg/dL — ABNORMAL HIGH (ref 65–99)
Glucose-Capillary: 229 mg/dL — ABNORMAL HIGH (ref 65–99)

## 2017-03-20 LAB — CBC WITH DIFFERENTIAL/PLATELET
Basophils Absolute: 0 10*3/uL (ref 0.0–0.1)
Basophils Relative: 0 %
Eosinophils Absolute: 0.2 10*3/uL (ref 0.0–0.7)
Eosinophils Relative: 5 %
HEMATOCRIT: 32.2 % — AB (ref 36.0–46.0)
HEMOGLOBIN: 11.1 g/dL — AB (ref 12.0–15.0)
LYMPHS PCT: 33 %
Lymphs Abs: 1.2 10*3/uL (ref 0.7–4.0)
MCH: 31.2 pg (ref 26.0–34.0)
MCHC: 34.5 g/dL (ref 30.0–36.0)
MCV: 90.4 fL (ref 78.0–100.0)
Monocytes Absolute: 0.5 10*3/uL (ref 0.1–1.0)
Monocytes Relative: 14 %
NEUTROS ABS: 1.6 10*3/uL — AB (ref 1.7–7.7)
NEUTROS PCT: 47 %
PLATELETS: 118 10*3/uL — AB (ref 150–400)
RBC: 3.56 MIL/uL — AB (ref 3.87–5.11)
RDW: 14 % (ref 11.5–15.5)
WBC: 3.5 10*3/uL — AB (ref 4.0–10.5)

## 2017-03-20 LAB — BASIC METABOLIC PANEL
Anion gap: 9 (ref 5–15)
BUN: 29 mg/dL — ABNORMAL HIGH (ref 6–20)
CHLORIDE: 89 mmol/L — AB (ref 101–111)
CO2: 30 mmol/L (ref 22–32)
Calcium: 8.9 mg/dL (ref 8.9–10.3)
Creatinine, Ser: 1.14 mg/dL — ABNORMAL HIGH (ref 0.44–1.00)
GFR calc non Af Amer: 45 mL/min — ABNORMAL LOW (ref >60)
GFR, EST AFRICAN AMERICAN: 52 mL/min — AB (ref >60)
Glucose, Bld: 122 mg/dL — ABNORMAL HIGH (ref 65–99)
POTASSIUM: 4 mmol/L (ref 3.5–5.1)
SODIUM: 128 mmol/L — AB (ref 135–145)

## 2017-03-20 LAB — SODIUM, URINE, RANDOM: Sodium, Ur: 52 mmol/L

## 2017-03-20 LAB — CREATININE, URINE, RANDOM: CREATININE, URINE: 16.45 mg/dL

## 2017-03-20 LAB — BRAIN NATRIURETIC PEPTIDE: B Natriuretic Peptide: 611.6 pg/mL — ABNORMAL HIGH (ref 0.0–100.0)

## 2017-03-20 LAB — OSMOLALITY, URINE: Osmolality, Ur: 198 mOsm/kg — ABNORMAL LOW (ref 300–900)

## 2017-03-20 MED ORDER — SORBITOL 70 % SOLN
960.0000 mL | TOPICAL_OIL | Freq: Once | ORAL | Status: AC
  Administered 2017-03-20: 960 mL via RECTAL
  Filled 2017-03-20: qty 473

## 2017-03-20 MED ORDER — FLUTICASONE PROPIONATE 50 MCG/ACT NA SUSP
2.0000 | Freq: Every day | NASAL | Status: DC
  Administered 2017-03-20 – 2017-03-26 (×6): 2 via NASAL
  Filled 2017-03-20: qty 16

## 2017-03-20 MED ORDER — FUROSEMIDE 10 MG/ML IJ SOLN
40.0000 mg | Freq: Two times a day (BID) | INTRAMUSCULAR | Status: AC
  Administered 2017-03-20 (×2): 40 mg via INTRAVENOUS
  Filled 2017-03-20 (×2): qty 4

## 2017-03-20 MED ORDER — LORATADINE 10 MG PO TABS
10.0000 mg | ORAL_TABLET | Freq: Every day | ORAL | Status: DC
  Administered 2017-03-21 – 2017-03-26 (×6): 10 mg via ORAL
  Filled 2017-03-20 (×6): qty 1

## 2017-03-20 MED ORDER — HYDROMORPHONE HCL 2 MG PO TABS: 2.0000 mg | ORAL_TABLET | ORAL | Status: DC | PRN

## 2017-03-20 MED ORDER — TAMSULOSIN HCL 0.4 MG PO CAPS
0.4000 mg | ORAL_CAPSULE | Freq: Every day | ORAL | Status: DC
  Administered 2017-03-20 – 2017-03-26 (×7): 0.4 mg via ORAL
  Filled 2017-03-20 (×7): qty 1

## 2017-03-20 NOTE — Progress Notes (Signed)
CSW following for SNF placement. Patient will discharge to [name] when ready. [facility] has bed available for patient on Monday 10/8. Patient's daughter communicated via text message with CSW and would like to make sure SNF is able to have patient's medications ready at discharge from hospital. CSW to support with discharge.  Rebecca [name], [place]

## 2017-03-20 NOTE — Progress Notes (Signed)
PROGRESS NOTE    Rebecca [name]  IRC:[id number] DOB: [birth year] DOA: 03/14/2017 PCP: [name], MD   Brief Narrative:  Pt. with PMH of COPD, chronic prednisone therapy, CAD status post DES to RCA in 2006, CKD stage IV, diastolic heart failure, MGUS, hypothyroidism ; admitted on 03/14/2017, presented with complaint of right hip pain, was found to have muscular pain. Currently further plan is pain control.   Assessment & Plan:   Principal Problem:   Acute right hip pain Active Problems:   Hypothyroidism   Dyslipidemia   Anemia in chronic renal disease   GASTROESOPHAGEAL REFLUX, NO ESOPHAGITIS   Chronic diastolic (congestive) heart failure (HCC)   Polymyalgia rheumatica (HCC)   MGUS (monoclonal gammopathy of unknown significance)   CKD stage 4, GFR 15-29 ml/min    Essential hypertension   AKI (acute kidney injury) (HCC)   Pneumonia   Chronic constipation   Chronic pain disorder   Pain of right hip joint   #1 right hip pain Likely secondary to L5-S1 spondylosis seen on MRI of the L-spine in 2015 with muscular pain in the gluteal region. CT right hip which was done 03/15/2017 with no acute osseous abnormalities, mild degenerative changes of the right hip joint with questionable chondrocalcinosis which could be age-related or reflect CPPD arthropathy. Patient still with significant pain and stated IV Dilaudid helps her pain however only lasted about 2 hours. Per family patient's oral Dilaudid dose was increased to 4 mg as needed per her hematologist/oncologist just prior to admission.   Patient's pain seems to be under better control. Continue long-acting MS Contin 15 mg twice daily, Dilaudid 4 mg every 4 hours as needed for breakthrough pain. Continue Neurontin. Discontinue IV Dilaudid as patient somewhat drowsy.  It is noted that patient scheduled to see an interventional pain doctors as an outpatient for her back pain and may benefit from a spinal/steroid injection. PT/OT.  #2  volume overload Patient sounds diet diffusely rhonchorous on lung exam with probable diffuse rales. Patient with a current weight of 69.4 kg from 63 kg on admission 03/14/2017. Patient on IV Lasix with a urine output of 2.2 L over the past 24 hours. Check a chest x-ray. Check a BNP. Continue Lasix 40 mg IV every 12 hours. Strict I's and O's. Daily weights.  #3 lethargy Patient somewhat lethargic and drowsy however opens eyes to verbal stimuli and answering questions appropriately. Daughter concerned that her mother likely has a urinary tract infection or some form of infection that is not being treated. Will check a bladder scan. Patient seems constipated on examination which was explained to the daughter that sometimes that could cause urinary hesitancy and as such will give a SMOG enema. Will check a chest x-ray. Repeat UA with cultures and sensitivities. Check a BNP. Continue diuresis with Lasix 40 mg IV every 12 hours. Strict I's and O's. Daily weights.  #4 healthcare associated pneumonia----ruled out Chest x-ray obtained on admission had some concerns for possible pneumonia. Patient was seen in consultation by ID patient clinically does not have a pneumonia. Blood cultures obtained with no growth to date. IV antibiotics were discontinued by ID recommendations. Patient afebrile. Patient with a chronic leukocytosis. Follow.  #5 repeated UTIs/colonization Patient has had multiple rounds of antibiotics for UTIs last urine culture 03/04/2017 showing pseudomonas aeruginosa. Patient with a chronically elevated leukocytosis on chronic prednisone therapy. Urine cultures positive for Pseudomonas. Patient has been seen in consultation by ID who feel that patient likely has colonization rather  than a true acute infection. IV antibiotics have been discontinued. Monitor.  #6 concerning for ascites--ruled out It was noted that patient was supposed to get a paracentesis as outpatient ordered per patient's  oncologist. Patient daughter had insisted that ultrasound and paracentesis be done during this hospitalization and as such abdominal ultrasound was ordered which was negative for ascites. Outpatient follow-up.  #7 coronary artery disease Patient asymptomatic. Patient with rhonchorous breath sounds and likely diffuse rales on examination. Patient however denies shortness of breath or chest pain. Will check a BNP, check a chest x-ray. Continue current regimen of aspirin and Plavix, Cardizem CD, Imdur, Toprol-XL, statin. Continue Lasix 40 mg IV every 12 hours and follow daily weights and urine output. Patient was to get a spinal injection in the outpatient setting Plavix will likely need to be discontinued at that time however will defer to pain management doctor at this time.  #8 COPD/polymyalgia rheumatica Stable. Continue home regimen of prednisone 10 mg daily. Patient on chronic prednisone and increased risk for avascular necrosis, pathologic fracture, osteoporosis. Continue Motrin as needed.  #9 MGUS Outpatient follow-up with hematology/oncology.  #10 hypertension Blood pressure stable. Continue current regimen of Cardizem CD,imdur, Toprol-XL. Monitor closely with diuretics.  #11 constipation Continue current regimen of MiraLAX twice daily, Senokot S twice daily. Patient with abdominal distention and probable urinary retention. Will give a SMOG enema 1.  #12 dysphagia During last admission speech therapy evaluated the patient and recommended dysphagia 3 diet. It is noted that patient's daughter states patient on a regular diet at home. Patient tolerating current diet of pured food which per nursing daughter orders. Follow.  #13  hyponatremia ?? Chronic hyponatremia. Patient with a worsening hyponatremia may have been secondary to hypervolemic hyponatremia as hyponatremia improved with some diuresis. Urine osmolality was 168 and low, urine sodium was 29, serum osmolality was decreased at 272.  Sodium levels worsened after gentle hydration. IV fluids were discontinued. TSH within normal limits at 0.898. Patient given Lasix 40 mg IV 1 03/17/2017 with some improvement with hyponatremia. Patient given Lasix 20 mg IV 1 03/18/2017 with a slight decrease in sodium levels. Patient current weight of 69.4 kg 03/20/2017 from 68 kg on 03/18/2017 from 63 kg on 03/14/2017. Patient with urine output of 2.2 L over the past 24 hours. Continue IV Lasix. Strict I's and O's. Daily weights.    DVT prophylaxis: Lovenox Code Status: DO NOT RESUSCITATE Family Communication: Updated patient and daughter at bedside. Disposition Plan: Likely skilled nursing facility when medically stable.    Consultants:   Infectious disease: Dr.Comer 03/15/2017  Procedures:   CT right hip 03/15/2017  Chest x-ray 03/14/2017  Abdominal ultrasound 03/15/2017  Plain films of the right hip and pelvis 03/14/2017  Antimicrobials:   IV vancomycin 03/14/2017>>>>> 03/15/2017  IV Zosyn 03/14/2017>>>> 03/15/2017   Subjective: Patient sitting in chair sleeping have a easily arousable. Patient somewhat drowsy. Patient states not feeling well. Patient denies any chest pain. Patient denies any shortness of breath.  Daughter concerned that patient has some kind of infection likely a urinary tract infection. Daughter feels mother is not back to her baseline and still sick.  Objective: Vitals:   03/19/17 2040 03/19/17 2110 03/20/17 0442 03/20/17 0913  BP: (!) 150/82  (!) 146/74   Pulse: 79  84   Resp: 18  18   Temp: 98.7 F (37.1 C)  98.4 F (36.9 C)   TempSrc: Oral  Oral   SpO2: 100% 97% 94% 96%  Weight:  69.4 kg (153 lb)   Height:        Intake/Output Summary (Last 24 hours) at 03/20/17 1206 Last data filed at 03/20/17 1031  Gross per 24 hour  Intake             1200 ml  Output             1600 ml  Net             -400 ml   Filed Weights   03/17/17 1608 03/18/17 0542 03/20/17 0442  Weight: 67.9 kg (149  lb 11.1 oz) 68 kg (149 lb 14.6 oz) 69.4 kg (153 lb)    Examination:  General exam: Sleeping. Drowsy.  Respiratory system: Diffuse rhonchorous, breath sounds with rales. Respiratory effort normal. Cardiovascular system: Regular rate and rhythm. No murmurs rubs or gallops. No lower extremity edema.  Gastrointestinal system: Abdomen is mildly distended, tight, positive bowel sounds. Central nervous system: Somewhat drowsy. No focal neurological deficits. Extremities: Symmetric 5 x 5 power. Skin: Rash on back Psychiatry: Judgement and insight appear fair. Mood & affect appropriate.     Data Reviewed: I have personally reviewed following labs and imaging studies  CBC:  Recent Labs Lab 03/14/17 1344 03/15/17 0404 03/16/17 0353  WBC 15.5* 13.4* 18.6*  NEUTROABS 12.8*  --  14.4*  HGB 10.3* 9.5* 9.0*  HCT 29.6* 27.6* 26.0*  MCV 87.6 86.8 89.3  PLT 230 220 157   Basic Metabolic Panel:  Recent Labs Lab 03/17/17 0346 03/17/17 1435 03/18/17 0351 03/19/17 0358 03/20/17 0353  NA 127* 125* 130* 127* 128*  K 4.6 5.1 4.1 4.5 4.0  CL 94* 93* 96* 92* 89*  CO2 25 21* 26 28 30   GLUCOSE 115* 190* 95 104* 122*  BUN 24* 22* 25* 23* 29*  CREATININE 1.02* 0.98 1.03* 1.03* 1.14*  CALCIUM 8.7* 8.7* 8.7* 8.6* 8.9   GFR: Estimated Creatinine Clearance: 38 mL/min (A) (by C-G formula based on SCr of 1.14 mg/dL (H)). Liver Function Tests:  Recent Labs Lab 03/14/17 1344 03/15/17 0404 03/16/17 0353  AST 14* 14* 15  ALT 14 13* 12*  ALKPHOS 42 39 31*  BILITOT 0.3 0.4 0.3  PROT 6.3* 5.8* 5.5*  ALBUMIN 3.4* 3.0* 2.8*   No results for input(s): LIPASE, AMYLASE in the last 168 hours. No results for input(s): AMMONIA in the last 168 hours. Coagulation Profile: No results for input(s): INR, PROTIME in the last 168 hours. Cardiac Enzymes: No results for input(s): CKTOTAL, [name], [name], TROPONINI in the last 168 hours. BNP (last 3 results) No results for input(s): PROBNP in the last  8760 hours. HbA1C: No results for input(s): HGBA1C in the last 72 hours. CBG:  Recent Labs Lab 03/19/17 0749 03/19/17 1202 03/19/17 1721 03/19/17 1944 03/20/17 0740  GLUCAP 94 136* 152* 227* 109*   Lipid Profile: No results for input(s): CHOL, [name], [name], [name], [name], LDLDIRECT in the last 72 hours. Thyroid Function Tests: No results for input(s): TSH, T4TOTAL, FREET4, T3FREE, THYROIDAB in the last 72 hours. Anemia Panel: No results for input(s): VITAMINB12, FOLATE, FERRITIN, TIBC, IRON, RETICCTPCT in the last 72 hours. Sepsis Labs:  Recent Labs Lab 03/14/17 1359  LATICACIDVEN 0.55    Recent Results (from the past 240 hour(s))  Blood culture (routine x 2)     Status: None   Collection Time: 03/14/17  1:45 PM  Result Value Ref Range Status   Specimen Description BLOOD LEFT ANTECUBITAL  Final   Special Requests   Final  BOTTLES DRAWN AEROBIC AND ANAEROBIC Blood Culture adequate volume   Culture   Final    NO GROWTH 5 DAYS Performed at [facility], [place] [address], [place], [place] [address]    Report Status 03/19/2017 FINAL  Final  Blood culture (routine x 2)     Status: None   Collection Time: 03/14/17  1:48 PM  Result Value Ref Range Status   Specimen Description BLOOD RIGHT ANTECUBITAL  Final   Special Requests   Final    BOTTLES DRAWN AEROBIC AND ANAEROBIC Blood Culture adequate volume   Culture   Final    NO GROWTH 5 DAYS Performed at [facility], [place] [address] [place], [place] [address]    Report Status 03/19/2017 FINAL  Final  Urine culture     Status: Abnormal   Collection Time: 03/14/17  2:44 PM  Result Value Ref Range Status   Specimen Description URINE, CATHETERIZED  Final   Special Requests NONE  Final   Culture >=100,000 COLONIES/mL PSEUDOMONAS AERUGINOSA (A)  Final   Report Status 03/16/2017 FINAL  Final   Organism ID, Bacteria PSEUDOMONAS AERUGINOSA (A)  Final      Susceptibility   Pseudomonas aeruginosa - MIC*     CEFTAZIDIME 4 SENSITIVE Sensitive     CIPROFLOXACIN <=0.25 SENSITIVE Sensitive     GENTAMICIN <=1 SENSITIVE Sensitive     IMIPENEM 2 SENSITIVE Sensitive     PIP/TAZO 8 SENSITIVE Sensitive     CEFEPIME 2 SENSITIVE Sensitive     * >=100,000 COLONIES/mL PSEUDOMONAS AERUGINOSA         Radiology Studies: No results found.      Scheduled Meds: . clopidogrel  75 mg Oral Q breakfast  . cloZAPine  300 mg Oral QHS  . diltiazem  180 mg Oral Daily  . enoxaparin (LOVENOX) injection  40 mg Subcutaneous Q24H  . furosemide  40 mg Intravenous Q12H  . gabapentin  100 mg Oral TID  . guaiFENesin  600 mg Oral BID  . insulin aspart  0-5 Units Subcutaneous QHS  . insulin aspart  0-9 Units Subcutaneous TID WC  . isosorbide mononitrate  60 mg Oral Daily  . levothyroxine  75 mcg Oral QAC breakfast  . loratadine  10 mg Oral Daily  . metoprolol succinate  100 mg Oral Q breakfast  . mometasone-formoterol  2 puff Inhalation BID  . morphine  15 mg Oral Q12H  . pantoprazole  40 mg Oral Daily  . polyethylene glycol  17 g Oral BID  . pravastatin  20 mg Oral QHS  . predniSONE  10 mg Oral Q breakfast  . senna-docusate  1 tablet Oral BID  . sodium chloride flush  3 mL Intravenous Q12H  . tiotropium  18 mcg Inhalation Daily   Continuous Infusions: . sodium chloride       LOS: 4 days    Time spent: 82 minutes    Heliodoro Domagalski, MD [facility] Pager [phone]  If 7PM-7AM, please contact night-coverage [email or web address] Password [email or web address] 03/20/2017, 12:06 PM

## 2017-03-20 NOTE — Progress Notes (Signed)
SMOG enema given to patient , had  extra large soft stools.

## 2017-03-21 ENCOUNTER — Inpatient Hospital Stay (HOSPITAL_COMMUNITY): Payer: Medicare Other

## 2017-03-21 ENCOUNTER — Encounter (HOSPITAL_COMMUNITY): Payer: Self-pay | Admitting: Radiology

## 2017-03-21 DIAGNOSIS — N179 Acute kidney failure, unspecified: Secondary | ICD-10-CM

## 2017-03-21 LAB — BASIC METABOLIC PANEL
Anion gap: 12 (ref 5–15)
Anion gap: 14 (ref 5–15)
BUN: 36 mg/dL — AB (ref 6–20)
BUN: 38 mg/dL — AB (ref 6–20)
CALCIUM: 8.8 mg/dL — AB (ref 8.9–10.3)
CALCIUM: 8.8 mg/dL — AB (ref 8.9–10.3)
CHLORIDE: 82 mmol/L — AB (ref 101–111)
CHLORIDE: 83 mmol/L — AB (ref 101–111)
CO2: 30 mmol/L (ref 22–32)
CO2: 27 mmol/L (ref 22–32)
CREATININE: 1.49 mg/dL — AB (ref 0.44–1.00)
CREATININE: 1.6 mg/dL — AB (ref 0.44–1.00)
GFR calc Af Amer: 38 mL/min — ABNORMAL LOW (ref >60)
GFR calc Af Amer: 34 mL/min — ABNORMAL LOW (ref >60)
GFR calc non Af Amer: 32 mL/min — ABNORMAL LOW (ref >60)
GFR calc non Af Amer: 30 mL/min — ABNORMAL LOW (ref >60)
GLUCOSE: 137 mg/dL — AB (ref 65–99)
Glucose, Bld: 101 mg/dL — ABNORMAL HIGH (ref 65–99)
Potassium: 3.9 mmol/L (ref 3.5–5.1)
Potassium: 3.9 mmol/L (ref 3.5–5.1)
SODIUM: 124 mmol/L — AB (ref 135–145)
Sodium: 124 mmol/L — ABNORMAL LOW (ref 135–145)

## 2017-03-21 LAB — CBC WITH DIFFERENTIAL/PLATELET
Basophils Absolute: 0 10*3/uL (ref 0.0–0.1)
Basophils Relative: 0 %
EOS ABS: 0.1 10*3/uL (ref 0.0–0.7)
EOS PCT: 1 %
HCT: 27.3 % — ABNORMAL LOW (ref 36.0–46.0)
HEMOGLOBIN: 9.5 g/dL — AB (ref 12.0–15.0)
LYMPHS ABS: 2.6 10*3/uL (ref 0.7–4.0)
Lymphocytes Relative: 15 %
MCH: 30.9 pg (ref 26.0–34.0)
MCHC: 34.8 g/dL (ref 30.0–36.0)
MCV: 88.9 fL (ref 78.0–100.0)
MONO ABS: 1.1 10*3/uL — AB (ref 0.1–1.0)
MONOS PCT: 6 %
NEUTROS PCT: 78 %
Neutro Abs: 13.4 10*3/uL — ABNORMAL HIGH (ref 1.7–7.7)
Platelets: 221 10*3/uL (ref 150–400)
RBC: 3.07 MIL/uL — ABNORMAL LOW (ref 3.87–5.11)
RDW: 13.6 % (ref 11.5–15.5)
WBC: 17.2 10*3/uL — ABNORMAL HIGH (ref 4.0–10.5)

## 2017-03-21 LAB — URINALYSIS, ROUTINE W REFLEX MICROSCOPIC
BILIRUBIN URINE: NEGATIVE
GLUCOSE, UA: NEGATIVE mg/dL
HGB URINE DIPSTICK: NEGATIVE
KETONES UR: NEGATIVE mg/dL
NITRITE: NEGATIVE
PH: 5 (ref 5.0–8.0)
Protein, ur: 100 mg/dL — AB
SPECIFIC GRAVITY, URINE: 1.011 (ref 1.005–1.030)

## 2017-03-21 LAB — BLOOD GAS, ARTERIAL
Acid-Base Excess: 4.4 mmol/L — ABNORMAL HIGH (ref 0.0–2.0)
Bicarbonate: 29.4 mmol/L — ABNORMAL HIGH (ref 20.0–28.0)
Drawn by: 295031
O2 CONTENT: 2 L/min
O2 SAT: 88.3 %
PCO2 ART: 48.7 mmHg — AB (ref 32.0–48.0)
PH ART: 7.398 (ref 7.350–7.450)
PO2 ART: 58.4 mmHg — AB (ref 83.0–108.0)
Patient temperature: 37

## 2017-03-21 LAB — GLUCOSE, CAPILLARY
GLUCOSE-CAPILLARY: 106 mg/dL — AB (ref 65–99)
GLUCOSE-CAPILLARY: 128 mg/dL — AB (ref 65–99)
Glucose-Capillary: 115 mg/dL — ABNORMAL HIGH (ref 65–99)
Glucose-Capillary: 151 mg/dL — ABNORMAL HIGH (ref 65–99)

## 2017-03-21 LAB — BLOOD GAS, VENOUS
ACID-BASE EXCESS: 4.4 mmol/L — AB (ref 0.0–2.0)
BICARBONATE: 29.7 mmol/L — AB (ref 20.0–28.0)
Drawn by: 270211
O2 SAT: 71.8 %
PATIENT TEMPERATURE: 37
PH VEN: 7.386 (ref 7.250–7.430)
PO2 VEN: 41.3 mmHg (ref 32.0–45.0)
pCO2, Ven: 50.7 mmHg (ref 44.0–60.0)

## 2017-03-21 LAB — AMMONIA: Ammonia: 12 umol/L (ref 9–35)

## 2017-03-21 MED ORDER — SODIUM CHLORIDE 3 % IN NEBU
4.0000 mL | INHALATION_SOLUTION | Freq: Every day | RESPIRATORY_TRACT | Status: AC
  Administered 2017-03-22 – 2017-03-23 (×2): 4 mL via RESPIRATORY_TRACT
  Filled 2017-03-21 (×3): qty 4

## 2017-03-21 MED ORDER — ENOXAPARIN SODIUM 30 MG/0.3ML ~~LOC~~ SOLN
30.0000 mg | SUBCUTANEOUS | Status: DC
  Administered 2017-03-21 – 2017-03-24 (×4): 30 mg via SUBCUTANEOUS
  Filled 2017-03-21 (×4): qty 0.3

## 2017-03-21 MED ORDER — PANTOPRAZOLE SODIUM 40 MG IV SOLR
40.0000 mg | INTRAVENOUS | Status: DC
  Administered 2017-03-22 – 2017-03-25 (×3): 40 mg via INTRAVENOUS
  Filled 2017-03-21 (×5): qty 40

## 2017-03-21 MED ORDER — FUROSEMIDE 10 MG/ML IJ SOLN
80.0000 mg | Freq: Two times a day (BID) | INTRAMUSCULAR | Status: DC
  Administered 2017-03-21: 80 mg via INTRAVENOUS
  Filled 2017-03-21: qty 8

## 2017-03-21 MED ORDER — IPRATROPIUM-ALBUTEROL 0.5-2.5 (3) MG/3ML IN SOLN
3.0000 mL | Freq: Three times a day (TID) | RESPIRATORY_TRACT | Status: DC
  Administered 2017-03-21 – 2017-03-26 (×15): 3 mL via RESPIRATORY_TRACT
  Filled 2017-03-21 (×16): qty 3

## 2017-03-21 MED ORDER — METOPROLOL TARTRATE 5 MG/5ML IV SOLN
5.0000 mg | Freq: Three times a day (TID) | INTRAVENOUS | Status: DC
  Filled 2017-03-21: qty 5

## 2017-03-21 MED ORDER — HYDROMORPHONE HCL 2 MG PO TABS
1.0000 mg | ORAL_TABLET | ORAL | Status: DC | PRN
  Administered 2017-03-21 – 2017-03-26 (×6): 1 mg via ORAL
  Filled 2017-03-21 (×6): qty 1

## 2017-03-21 MED ORDER — FUROSEMIDE 10 MG/ML IJ SOLN
60.0000 mg | Freq: Two times a day (BID) | INTRAMUSCULAR | Status: DC
  Administered 2017-03-21: 60 mg via INTRAVENOUS
  Filled 2017-03-21: qty 6

## 2017-03-21 MED ORDER — ALBUTEROL SULFATE (2.5 MG/3ML) 0.083% IN NEBU
2.5000 mg | INHALATION_SOLUTION | Freq: Two times a day (BID) | RESPIRATORY_TRACT | Status: DC
Start: 2017-03-21 — End: 2017-03-21

## 2017-03-21 NOTE — Progress Notes (Signed)
Pt has continued to sleep all day. She arouses easily to verbal stimuli but doesn't stay awake long.

## 2017-03-21 NOTE — Progress Notes (Signed)
Angie from the lab called me and ask that I check the  02 level on Rebecca [name] due to the levels she got on the ABG. O2 sat was 88.I applied O2 AT 2 L

## 2017-03-21 NOTE — Progress Notes (Signed)
Pt is still sleepy. I was able to get her to do her inhalers after stimulation. Pt did flutter.Poor effort. I did not do a chest vest beacause pt moans and complains of pain when being moved around. She is very congested. I ordered BID Albuterol nebulizers per protocol per daughters request.

## 2017-03-21 NOTE — Care Management Important Message (Signed)
Important Message  Patient Details  Name: DEVRI KREHER MRN: [id number] Date of Birth: 1940/08/26   Medicare Important Message Given:  Yes    Kerin Salen 03/21/2017, 1:09 [place] [facility] Message  Patient Details  Name: KATHALINA OSTERMANN MRN: [id number] Date of Birth: 06/06/1941   Medicare Important Message Given:  Yes    Kerin Salen 03/21/2017, 1:09 PM

## 2017-03-21 NOTE — Progress Notes (Signed)
Flutter valve at bedside for patient use to assist with secretion clearance.  Pt sleepy earlier in the evening and will need education on use of flutter valve.

## 2017-03-21 NOTE — Progress Notes (Signed)
Pt continues to sleep soundly. Responds to verbal stimuli. She swallowed some of her meds . Most were held due to drowsiness.

## 2017-03-21 NOTE — Progress Notes (Signed)
Went to room to give inhalers and give instruction on flutter valve use. Pt is to sleepy to do either. Will check back later on this morning to see if she is more awake.

## 2017-03-21 NOTE — Progress Notes (Signed)
PT Cancellation Note  Patient Details Name: Rebecca [name] MRN: [id number] DOB: 1941/06/02   Cancelled Treatment:    Reason Eval/Treat Not Completed: Medical issues which prohibited therapy, noted low BP sand  Sodium down . Will check back another time as schedule permits.   Marcelino Freestone PT [phone]  03/21/2017, 7:39 AM

## 2017-03-21 NOTE — Progress Notes (Addendum)
PROGRESS NOTE    Rebecca [name]  JXB:[id number] DOB: [birth year] DOA: 03/14/2017 PCP: [name], MD   Brief Narrative:  Pt. with PMH of COPD, chronic prednisone therapy, CAD status post DES to RCA in 2006, CKD stage IV, diastolic heart failure, MGUS, hypothyroidism ; admitted on 03/14/2017, presented with complaint of right hip pain, was found to have muscular pain. Currently further plan is pain control.   Assessment & Plan:   Principal Problem:   Acute right hip pain Active Problems:   Hypothyroidism   Dyslipidemia   Anemia in chronic renal disease   GASTROESOPHAGEAL REFLUX, NO ESOPHAGITIS   Chronic diastolic (congestive) heart failure (HCC)   Polymyalgia rheumatica (HCC)   MGUS (monoclonal gammopathy of unknown significance)   CKD stage 4, GFR 15-29 ml/min    Essential hypertension   AKI (acute kidney injury) (HCC)   Pneumonia   Chronic constipation   Chronic pain disorder   Pain of right hip joint   Hypervolemia   Lethargy   #1 right hip pain Likely secondary to L5-S1 spondylosis seen on MRI of the L-spine in 2015 with muscular pain in the gluteal region. CT right hip which was done 03/15/2017 with no acute osseous abnormalities, mild degenerative changes of the right hip joint with questionable chondrocalcinosis which could be age-related or reflect CPPD arthropathy. Patient still with significant pain and stated IV Dilaudid helps her pain however only lasted about 2 hours. Per family patient's oral Dilaudid dose was increased to 4 mg as needed per her hematologist/oncologist just prior to admission.   Patient's pain seems to be under better control. Continue long-acting MS Contin 15 mg twice daily, Dilaudid 4 mg every 4 hours as needed for breakthrough pain. Continue Neurontin. Discontinue IV Dilaudid as patient somewhat drowsy.  It is noted that patient scheduled to see an interventional pain doctors as an outpatient for her back pain and may benefit from a  spinal/steroid injection. PT/OT.  #2 volume overload Patient sounds diffusely rhonchorous on lung exam with probable diffuse rales. Patient with a current weight of 70 kg from 69.4 kg from 63 kg on admission 03/14/2017. Patient on IV Lasix with a urine output of 1.3 L over the past 24 hours. Chest x-ray with no significant acute abnormalities. BNP elevated. Sodium levels decreasing will increase Lasix to 60 mg IV every 12 hours. Strict I's and O's. Daily weights.  #3 lethargy Patient lethargic and drowsy however opens eyes to verbal stimuli and answering questions appropriately. ?? Etiology. Daughter was concerned that her mother likely has a urinary tract infection or some form of infection that is not being treated. Bladder scan did show some urinary retention and a such Foley cath was placed and patient started on Flomax. Patient noted to be constipated and patient given a smog enema with significant results. Abdomen is less tense and softer. BNP was noted to be elevated. Urinalysis with trace leukocytes nitrite -6-30 WBCs. Urine cultures pending. Chest x-ray with no significant acute abnormalities. Increase Lasix to 40 mg IV every 12 hours due to increasing weight, worsening hyponatremia. Will check a CT head, ammonia levels, ABG. Discontinue long-acting MS Contin. Decrease breakthrough oral Dilaudid to 1 mg every 4 hours as needed. IV Dilaudid has been discontinued. Strict I's and O's. Daily weights.  #4 healthcare associated pneumonia----ruled out Chest x-ray obtained on admission had some concerns for possible pneumonia. Patient was seen in consultation by ID patient clinically does not have a pneumonia. Blood cultures obtained with no growth  to date. IV antibiotics were discontinued by ID recommendations. Patient afebrile. Patient with a chronic leukocytosis. Follow.  #5 repeated UTIs/colonization Patient has had multiple rounds of antibiotics for UTIs last urine culture 03/04/2017 showing  pseudomonas aeruginosa. Patient with a chronically elevated leukocytosis on chronic prednisone therapy. Urine cultures positive for Pseudomonas. Patient has been seen in consultation by ID who feel that patient likely has colonization rather than a true acute infection. IV antibiotics have been discontinued. Monitor.  #6 concerning for ascites--ruled out It was noted that patient was supposed to get a paracentesis as outpatient ordered per patient's oncologist. Patient daughter had insisted that ultrasound and paracentesis be done during this hospitalization and as such abdominal ultrasound was ordered which was negative for ascites. Outpatient follow-up.  #7 coronary artery disease Patient asymptomatic. Patient with rhonchorous breath sounds and likely diffuse rales on examination. Patient however denies shortness of breath or chest pain. BNP elevated in the 600s. Chest x-ray with no significant abnormalities. Continue current regimen of aspirin and Plavix, Cardizem CD, Imdur, Toprol-XL, statin. Continue Lasix 60 mg IV every 12 hours and follow daily weights and urine output. Patient was to get a spinal injection in the outpatient setting Plavix will likely need to be discontinued at that time however will defer to pain management doctor at this time.  #8 COPD/polymyalgia rheumatica Stable. Continue home regimen of prednisone 10 mg daily. Patient on chronic prednisone and increased risk for avascular necrosis, pathologic fracture, osteoporosis. Continue Motrin as needed.  #9 MGUS Outpatient follow-up with hematology/oncology.  #10 hypertension Blood pressure stable. Continue current regimen of Cardizem CD,imdur, Toprol-XL. Monitor closely with diuretics.  #11 constipation Continue current regimen of MiraLAX twice daily, Senokot S twice daily. Patient with abdominal distention and probable urinary retention yesterday 03/20/2017. Patient given a smog enema with good results.  #12 urinary  retention Patient was noted to have urinary retention 03/47/4259 and folic acid replaced. Patient started on Flomax.  #13 dysphagia During last admission speech therapy evaluated the patient and recommended dysphagia 3 diet. It is noted that patient's daughter states patient on a regular diet at home. Patient tolerating current diet of pured food which per nursing daughter orders. Follow.  #14  hyponatremia ?? Acute on Chronic hyponatremia. Patient with a worsening hyponatremia may have been secondary to hypervolemic hyponatremia as hyponatremia improved with some diuresis initially. Urine osmolality was 168 and low, urine sodium was 29, serum osmolality was decreased at 272. Sodium levels worsened after gentle hydration. IV fluids were discontinued. TSH within normal limits at 0.898. Patient given Lasix 40 mg IV 1 03/17/2017 with some improvement with hyponatremia. Patient given Lasix 20 mg IV 1 03/18/2017 with a slight decrease in sodium levels. Patient current weight of 70 kg 03/21/2017 from 69.4 kg 03/20/2017 from 68 kg on 03/18/2017 from 63 kg on 03/14/2017. Patient with urine output of 1.3 L over the past 24 hours. Increase Lasix to 60 mg IV every 12 hours. Strict diet and O's. Daily weights. Repeat labs this afternoon and if still worsening with no improvement will need to consult with nephrology for further evaluation and management.    DVT prophylaxis: Lovenox Code Status: DO NOT RESUSCITATE Family Communication: Updated patient and daughter at bedside. Disposition Plan: Likely skilled nursing facility when medically stable.    Consultants:   Infectious disease: Dr.Comer 03/15/2017  Procedures:   CT right hip 03/15/2017  Chest x-ray 03/14/2017, 03/20/2017  Abdominal ultrasound 03/15/2017  Plain films of the right hip and pelvis 03/14/2017  CT head pending  Antimicrobials:   IV vancomycin 03/14/2017>>>>> 03/15/2017  IV Zosyn 03/14/2017>>>>  03/15/2017   Subjective: Patient laid in bed drowsy however opens eyes to verbal stimuli and following some commands. Denies any chest with no shortness of breath. Very rhonchorous on auscultation.   Objective: Vitals:   03/20/17 2245 03/21/17 0444 03/21/17 1227 03/21/17 1257  BP:  (!) 106/59  (!) 110/50  Pulse:  75  74  Resp:  18  18  Temp:  98 F (36.7 C)  98.4 F (36.9 C)  TempSrc:  Oral  Oral  SpO2: 97% 97% 91% 92%  Weight:  70 kg (154 lb 5.2 oz)    Height:        Intake/Output Summary (Last 24 hours) at 03/21/17 1342 Last data filed at 03/21/17 1000  Gross per 24 hour  Intake              360 ml  Output             1500 ml  Net            -1140 ml   Filed Weights   03/18/17 0542 03/20/17 0442 03/21/17 0444  Weight: 68 kg (149 lb 14.6 oz) 69.4 kg (153 lb) 70 kg (154 lb 5.2 oz)    Examination:  General exam: Drowsy  Respiratory system: Diffuse rhonchorous, breath sounds with rales. Respiratory effort normal. Cardiovascular system: Regular rate and rhythm. No murmurs rubs or gallops. No lower extremity edema.  Gastrointestinal system: Abdomen is soft, nontender, nondistended, positive bowel sounds.  Central nervous system: Drowsy however easily arousable but drifts back off to sleep. Moving extremities spontaneously. Extremities: Symmetric 5 x 5 power. Skin: Rash on back Psychiatry: Judgement and insight appear fair. Mood & affect appropriate.     Data Reviewed: I have personally reviewed following labs and imaging studies  CBC:  Recent Labs Lab 03/14/17 1344 03/15/17 0404 03/16/17 0353 03/20/17 1330 03/21/17 0407  WBC 15.5* 13.4* 18.6* 3.5* 17.2*  NEUTROABS 12.8*  --  14.4* 1.6* 13.4*  HGB 10.3* 9.5* 9.0* 11.1* 9.5*  HCT 29.6* 27.6* 26.0* 32.2* 27.3*  MCV 87.6 86.8 89.3 90.4 88.9  PLT 230 220 206 118* 128   Basic Metabolic Panel:  Recent Labs Lab 03/17/17 1435 03/18/17 0351 03/19/17 0358 03/20/17 0353 03/21/17 0407  NA 125* 130* 127* 128*  124*  K 5.1 4.1 4.5 4.0 3.9  CL 93* 96* 92* 89* 82*  CO2 21* 26 28 30 30   GLUCOSE 190* 95 104* 122* 101*  BUN 22* 25* 23* 29* 36*  CREATININE 0.98 1.03* 1.03* 1.14* 1.49*  CALCIUM 8.7* 8.7* 8.6* 8.9 8.8*   GFR: Estimated Creatinine Clearance: 29.2 mL/min (A) (by C-G formula based on SCr of 1.49 mg/dL (H)). Liver Function Tests:  Recent Labs Lab 03/14/17 1344 03/15/17 0404 03/16/17 0353  AST 14* 14* 15  ALT 14 13* 12*  ALKPHOS 42 39 31*  BILITOT 0.3 0.4 0.3  PROT 6.3* 5.8* 5.5*  ALBUMIN 3.4* 3.0* 2.8*   No results for input(s): LIPASE, AMYLASE in the last 168 hours. No results for input(s): AMMONIA in the last 168 hours. Coagulation Profile: No results for input(s): INR, PROTIME in the last 168 hours. Cardiac Enzymes: No results for input(s): CKTOTAL, [name], [name], TROPONINI in the last 168 hours. BNP (last 3 results) No results for input(s): PROBNP in the last 8760 hours. HbA1C: No results for input(s): HGBA1C in the last 72 hours. CBG:  Recent Labs Lab 03/20/17  1237 03/20/17 1724 03/20/17 2038 03/21/17 0724 03/21/17 1147  GLUCAP 229* 178* 132* 106* 115*   Lipid Profile: No results for input(s): CHOL, [name], [name], [name], [name], LDLDIRECT in the last 72 hours. Thyroid Function Tests: No results for input(s): TSH, T4TOTAL, FREET4, T3FREE, THYROIDAB in the last 72 hours. Anemia Panel: No results for input(s): VITAMINB12, FOLATE, FERRITIN, TIBC, IRON, RETICCTPCT in the last 72 hours. Sepsis Labs:  Recent Labs Lab 03/14/17 1359  LATICACIDVEN 0.55    Recent Results (from the past 240 hour(s))  Blood culture (routine x 2)     Status: None   Collection Time: 03/14/17  1:45 PM  Result Value Ref Range Status   Specimen Description BLOOD LEFT ANTECUBITAL  Final   Special Requests   Final    BOTTLES DRAWN AEROBIC AND ANAEROBIC Blood Culture adequate volume   Culture   Final    NO GROWTH 5 DAYS Performed at [facility], [address]  [place], [place] [address]    Report Status 03/19/2017 FINAL  Final  Blood culture (routine x 2)     Status: None   Collection Time: 03/14/17  1:48 PM  Result Value Ref Range Status   Specimen Description BLOOD RIGHT ANTECUBITAL  Final   Special Requests   Final    BOTTLES DRAWN AEROBIC AND ANAEROBIC Blood Culture adequate volume   Culture   Final    NO GROWTH 5 DAYS Performed at [facility], [place] [address], [place],  [address]    Report Status 03/19/2017 FINAL  Final  Urine culture     Status: Abnormal   Collection Time: 03/14/17  2:44 PM  Result Value Ref Range Status   Specimen Description URINE, CATHETERIZED  Final   Special Requests NONE  Final   Culture >=100,000 COLONIES/mL PSEUDOMONAS AERUGINOSA (A)  Final   Report Status 03/16/2017 FINAL  Final   Organism ID, Bacteria PSEUDOMONAS AERUGINOSA (A)  Final      Susceptibility   Pseudomonas aeruginosa - MIC*    CEFTAZIDIME 4 SENSITIVE Sensitive     CIPROFLOXACIN <=0.25 SENSITIVE Sensitive     GENTAMICIN <=1 SENSITIVE Sensitive     IMIPENEM 2 SENSITIVE Sensitive     PIP/TAZO 8 SENSITIVE Sensitive     CEFEPIME 2 SENSITIVE Sensitive     * >=100,000 COLONIES/mL PSEUDOMONAS AERUGINOSA         Radiology Studies: Dg Chest 2 View  Result Date: 03/20/2017 CLINICAL DATA:  76 y/o F; weakness and cough. History of CAD, COPD, pneumonia, diabetes, and stroke. History of cardiac cath in 2017. Former smoker. EXAM: CHEST  2 VIEW COMPARISON:  03/14/2017, 09/13/2016, 08/10/2015 chest radiograph. 01/26/2017 CT chest. FINDINGS: Stable normal cardiac silhouette given projection and technique. Aortic atherosclerosis with calcification. Improved aeration of the left lung base with mild residual streaky opacity. No new consolidation, effusion, or pneumothorax. Degenerative changes of bilateral glenohumeral joints and the thoracic spine. No acute osseous abnormality is evident. IMPRESSION: 1. Improved aeration of the left lung base with mild  residual streaky opacity similar to prior radiographs, probably scarring and atelectasis. No new focal consolidation. 2. Aortic atherosclerosis. Electronically Signed   By: Kristine Garbe M.D.   On: 03/20/2017 16:43        Scheduled Meds: . albuterol  2.5 mg Nebulization BID  . clopidogrel  75 mg Oral Q breakfast  . cloZAPine  300 mg Oral QHS  . diltiazem  180 mg Oral Daily  . enoxaparin (LOVENOX) injection  30  mg Subcutaneous Q24H  . fluticasone  2 spray Each Nare Daily  . furosemide  60 mg Intravenous Q12H  . gabapentin  100 mg Oral TID  . guaiFENesin  600 mg Oral BID  . insulin aspart  0-5 Units Subcutaneous QHS  . insulin aspart  0-9 Units Subcutaneous TID WC  . isosorbide mononitrate  60 mg Oral Daily  . levothyroxine  75 mcg Oral QAC breakfast  . loratadine  10 mg Oral Daily  . metoprolol succinate  100 mg Oral Q breakfast  . mometasone-formoterol  2 puff Inhalation BID  . morphine  15 mg Oral Q12H  . pantoprazole  40 mg Oral Daily  . polyethylene glycol  17 g Oral BID  . pravastatin  20 mg Oral QHS  . predniSONE  10 mg Oral Q breakfast  . senna-docusate  1 tablet Oral BID  . sodium chloride flush  3 mL Intravenous Q12H  . tamsulosin  0.4 mg Oral Daily  . tiotropium  18 mcg Inhalation Daily   Continuous Infusions: . sodium chloride       LOS: 5 days    Time spent: 40 minutes    Harish Bram, MD [facility] Pager [phone]  If 7PM-7AM, please contact night-coverage [email or web address] Password [email or web address] 03/21/2017, 1:42 PM

## 2017-03-22 DIAGNOSIS — R531 Weakness: Secondary | ICD-10-CM

## 2017-03-22 DIAGNOSIS — R188 Other ascites: Secondary | ICD-10-CM

## 2017-03-22 LAB — CBC WITH DIFFERENTIAL/PLATELET
BASOS PCT: 0 %
Basophils Absolute: 0 10*3/uL (ref 0.0–0.1)
Eosinophils Absolute: 0 10*3/uL (ref 0.0–0.7)
Eosinophils Relative: 0 %
HEMATOCRIT: 27.3 % — AB (ref 36.0–46.0)
HEMOGLOBIN: 9.3 g/dL — AB (ref 12.0–15.0)
LYMPHS ABS: 1.2 10*3/uL (ref 0.7–4.0)
LYMPHS PCT: 6 %
MCH: 30.6 pg (ref 26.0–34.0)
MCHC: 34.1 g/dL (ref 30.0–36.0)
MCV: 89.8 fL (ref 78.0–100.0)
MONOS PCT: 5 %
Monocytes Absolute: 1 10*3/uL (ref 0.1–1.0)
NEUTROS ABS: 17.7 10*3/uL — AB (ref 1.7–7.7)
NEUTROS PCT: 89 %
Platelets: 210 10*3/uL (ref 150–400)
RBC: 3.04 MIL/uL — ABNORMAL LOW (ref 3.87–5.11)
RDW: 13.5 % (ref 11.5–15.5)
WBC: 19.8 10*3/uL — ABNORMAL HIGH (ref 4.0–10.5)

## 2017-03-22 LAB — BASIC METABOLIC PANEL
ANION GAP: 13 (ref 5–15)
Anion gap: 14 (ref 5–15)
BUN: 46 mg/dL — ABNORMAL HIGH (ref 6–20)
BUN: 54 mg/dL — AB (ref 6–20)
CALCIUM: 8.2 mg/dL — AB (ref 8.9–10.3)
CHLORIDE: 84 mmol/L — AB (ref 101–111)
CHLORIDE: 86 mmol/L — AB (ref 101–111)
CO2: 26 mmol/L (ref 22–32)
CO2: 25 mmol/L (ref 22–32)
CREATININE: 2.68 mg/dL — AB (ref 0.44–1.00)
Calcium: 8.2 mg/dL — ABNORMAL LOW (ref 8.9–10.3)
Creatinine, Ser: 2.38 mg/dL — ABNORMAL HIGH (ref 0.44–1.00)
GFR calc Af Amer: 18 mL/min — ABNORMAL LOW (ref >60)
GFR calc non Af Amer: 18 mL/min — ABNORMAL LOW (ref >60)
GFR calc non Af Amer: 16 mL/min — ABNORMAL LOW (ref >60)
GFR, EST AFRICAN AMERICAN: 21 mL/min — AB (ref >60)
GLUCOSE: 127 mg/dL — AB (ref 65–99)
Glucose, Bld: 118 mg/dL — ABNORMAL HIGH (ref 65–99)
POTASSIUM: 4.6 mmol/L (ref 3.5–5.1)
POTASSIUM: 4.2 mmol/L (ref 3.5–5.1)
Sodium: 123 mmol/L — ABNORMAL LOW (ref 135–145)
Sodium: 125 mmol/L — ABNORMAL LOW (ref 135–145)

## 2017-03-22 LAB — URINE CULTURE: Culture: NO GROWTH

## 2017-03-22 LAB — URIC ACID: Uric Acid, Serum: 8 mg/dL — ABNORMAL HIGH (ref 2.3–6.6)

## 2017-03-22 LAB — GLUCOSE, CAPILLARY
GLUCOSE-CAPILLARY: 112 mg/dL — AB (ref 65–99)
GLUCOSE-CAPILLARY: 124 mg/dL — AB (ref 65–99)
GLUCOSE-CAPILLARY: 112 mg/dL — AB (ref 65–99)
Glucose-Capillary: 140 mg/dL — ABNORMAL HIGH (ref 65–99)

## 2017-03-22 LAB — SODIUM, URINE, RANDOM: Sodium, Ur: <10 mmol/L

## 2017-03-22 LAB — INFLUENZA PANEL BY PCR (TYPE A & B)
INFLAPCR: NEGATIVE
Influenza B By PCR: NEGATIVE

## 2017-03-22 LAB — STREP PNEUMONIAE URINARY ANTIGEN: STREP PNEUMO URINARY ANTIGEN: NEGATIVE

## 2017-03-22 LAB — CORTISOL: Cortisol, Plasma: 5.4 ug/dL

## 2017-03-22 LAB — OSMOLALITY: Osmolality: 273 mOsm/kg — ABNORMAL LOW (ref 275–295)

## 2017-03-22 LAB — CREATININE, URINE, RANDOM: CREATININE, URINE: 60.25 mg/dL

## 2017-03-22 LAB — OSMOLALITY, URINE: OSMOLALITY UR: 247 mosm/kg — AB (ref 300–900)

## 2017-03-22 MED ORDER — SODIUM CHLORIDE 0.9 % IV SOLN
INTRAVENOUS | Status: DC
  Administered 2017-03-22 – 2017-03-23 (×2): via INTRAVENOUS

## 2017-03-22 MED ORDER — VANCOMYCIN HCL 10 G IV SOLR
1500.0000 mg | Freq: Once | INTRAVENOUS | Status: AC
  Administered 2017-03-22: 1500 mg via INTRAVENOUS
  Filled 2017-03-22: qty 1500

## 2017-03-22 MED ORDER — PREMIER PROTEIN SHAKE
11.0000 [oz_av] | Freq: Two times a day (BID) | ORAL | Status: DC
  Administered 2017-03-22 – 2017-03-26 (×7): 11 [oz_av] via ORAL
  Filled 2017-03-22 (×10): qty 325.31

## 2017-03-22 MED ORDER — DEXTROSE 5 % IV SOLN
1.0000 g | INTRAVENOUS | Status: DC
  Administered 2017-03-22 – 2017-03-25 (×4): 1 g via INTRAVENOUS
  Filled 2017-03-22 (×4): qty 1

## 2017-03-22 NOTE — Progress Notes (Signed)
Physical Therapy Treatment Patient Details Name: Rebecca [name] MRN: [id number] DOB: [birth year] Today's Date: 03/22/2017    History of Present Illness PMH of COPD, chronic prednisone therapy, CAD, CKD stage IV, diastolic heart failure, MGUS, hypothyroidism, CVA, polymyalgia rheumatica; admitted on 03/14/2017, presented with complaint of right hip pain (likely combination from L5-S1 spondylosis, seen on MRI of lumbar spine in 2015 and muscular pain in gluteal region)    PT Comments    Pt remained lethargic/sleepy with brief arousal/words.  Assisted to EOB to increase alertness.  Pt required Total Assist.  Daughter present and also attempted to awaken pt both verbal and tactile stimulation.  Pt briefly responded to name and expressed her "bottom hurts".  Sat EOB x 7 min but required Total support to prevent forward loss of balance.  Pt unable to grasp a cup.  Vitals taken EOB HR 73, 2 lts nasal 97% and BP 116/48(map 86).  Pt unable to grasp walker, so "Bear Hug" stand pivot sit from elevated bed to recliner Total Assist.  Positioned in recliner to achieve midline/upright.  Briefly open eyes.    Follow Up Recommendations  SNF     Equipment Recommendations  None recommended by PT    Recommendations for Other Services       Precautions / Restrictions Precautions Precautions: Fall Restrictions Weight Bearing Restrictions: No    Mobility  Bed Mobility Overal bed mobility: Needs Assistance Bed Mobility: Supine to Sit     Supine to sit: +2 for physical assistance;+2 for safety/equipment;Total assist (pt 0%)     General bed mobility comments: assisted getting pt to EOB to increased alertness.  Required Total Assist.  Sat EOB x 7 min with daughter attempting to arouse pt.    Transfers Overall transfer level: Needs assistance Equipment used: None Transfers: Stand Pivot Transfers Sit to Stand: Total assist         General transfer comment: "bear Hug" stand pivot 1/4 turn from elevated  bed to recliner pt 5% as pt was unable to grasp walker and unable to maintain sitting balance  Ambulation/Gait             General Gait Details: unable to attempt due to poor transfer ability   Stairs            Wheelchair Mobility    Modified Rankin (Stroke Patients Only)       Balance                                            Cognition Arousal/Alertness: Lethargic                                     General Comments: mostly groggy/sleepy difficult to keep aroused       Exercises      General Comments        Pertinent Vitals/Pain Pain Assessment: No/denies pain    Home Living                      Prior Function            PT Goals (current goals can now be found in the care plan section) Progress towards PT goals: Progressing toward goals    Frequency    Min 3X/week  PT Plan Current plan remains appropriate    Co-evaluation              AM-PAC PT "6 Clicks" Daily Activity  Outcome Measure  Difficulty turning over in bed (including adjusting bedclothes, sheets and blankets)?: Unable Difficulty moving from lying on back to sitting on the side of the bed? : Unable Difficulty sitting down on and standing up from a chair with arms (e.g., wheelchair, bedside commode, etc,.)?: Unable Help needed moving to and from a bed to chair (including a wheelchair)?: Total Help needed walking in hospital room?: Total Help needed climbing 3-5 steps with a railing? : Total 6 Click Score: 6    End of Session Equipment Utilized During Treatment: Gait belt Activity Tolerance: Patient limited by lethargy Patient left: in chair;with family/visitor present;with call bell/phone within reach Nurse Communication: Mobility status PT Visit Diagnosis: Difficulty in walking, not elsewhere classified (R26.2);Pain Pain - Right/Left: Right Pain - part of body: Leg     Time: 0223-3612 PT Time Calculation (min)  (ACUTE ONLY): 26 min  Charges:  $Therapeutic Activity: 23-37 mins                    G Codes:       Rica Koyanagi  PTA [facility]  Acute  Rehab Pager      [phone]

## 2017-03-22 NOTE — Progress Notes (Signed)
OT Cancellation Note  Patient Details Name: Rebecca [name] MRN: [id number] DOB: 1940/12/23   Cancelled Treatment:    Reason Eval/Treat Not Completed: Medical issues which prohibited therapy. Pt with low BP and decreased Na+.  Per last MD's note, pt also lethargic. Will check back tomorrow.  Rahaf Carbonell 03/22/2017, 1:15 PM  Lesle Chris, OTR/L [phone] 03/22/2017

## 2017-03-22 NOTE — Progress Notes (Signed)
Patient was unable to do CPT via flutter device due to being very sleepy.

## 2017-03-22 NOTE — Progress Notes (Signed)
Pharmacy Antibiotic Note  Rebecca [name] is a 76 y.o. female admitted on 03/14/2017 with c/o hip pain, CXray with infiltrate vs atalectasis at L base. Vancomycin and Zosyn started empirically for possible HCAP 10/1-10/2. Previous Urine cx with Pseudomonas, considered colonized - no treatment per ID MD  10/9:  Pharmacy has been consulted again for Vancomycin/Cefepime dosing for HCAP.   Plan: Renal fx poor, Cl < 20 ml/min Vancomycin 1500mg  x1, obtain random Vanc level in am for continued dosing De-escalate abx as soon as possible, high risk of renal impairment with Vanc Cefepime 1gm q24  Height: 5\' 3"  (160 cm) Weight: 154 lb 5.2 oz (70 kg) IBW/kg (Calculated) : 52.4  Temp (24hrs), Avg:99.2 F (37.3 C), Min:98.4 F (36.9 C), Max:99.9 F (37.7 C)   Recent Labs Lab 03/16/17 0353  03/19/17 0358 03/20/17 0353 03/20/17 1330 03/21/17 0407 03/21/17 1352 03/22/17 0428  WBC 18.6*  --   --   --  3.5* 17.2*  --  19.8*  CREATININE 1.11*  < > 1.03* 1.14*  --  1.49* 1.60* 2.38*  < > = values in this interval not displayed.  Estimated Creatinine Clearance: 18.3 mL/min (A) (by C-G formula based on SCr of 2.38 mg/dL (H)).    Allergies  Allergen Reactions  . Levaquin [Levofloxacin] Other (See Comments)    Ruptured tendon  . Tape Other (See Comments)    PATIENT'S SKIN IS VERY, VERY THIN AND WILL TEAR AND BRUISE EASILY; PLEASE USE AN ALTERNATIVE (SOMETHING OTHER THAN TAPE)   Antimicrobials this admission:  10/1 vancomycin >> 10/2 10/1 Zosyn >> 10/2 10/9 Vancomycin >> 10/9 Cefepime >>  Microbiology results:  10/1 BCx: ng-final 10/1 UCx: pseudomonas - colonized-do not treat/ID, UCx 9/24 with pseudomonas 10/7 UCx: cath urine, ng-final          Flu PCR:          Strep pneumo/Legionella  10/9  BCx:          Sputum:  10/9 HIV Ab: pending   Thank you for allowing pharmacy to be a part of this patient's care.  Minda Ditto PharmD Pager [phone] 03/22/2017, 10:37 AM

## 2017-03-22 NOTE — Consult Note (Signed)
Rebecca [name] Admit Date: 03/14/2017 03/22/2017 Rebecca [name] Requesting Physician:  Rebecca Cools MD  Reason for Consult:  Hyponatremia, AoCKD HPI:  76 year old female admitted on 10/1 with right hip pain and negative orthopedic eval who has been admitted since for pain control and lethargy. Patient is groggy, unable to contribute to interview. Information gathered by chart review and conversation with daughter.  PMH Incudes:  CKD 4, followed by Dr. Moshe [name], last seen in June of this year. Labile serum creatinine usually in the high 1s.  Admission creatinine 1.17. Multifactorial etiology.  Schizoaffective disorder on Clozaril  Hypothyroidism on Synthroid, TSH 0.9 on 10/4  MGUS, followed by Dr. Alvy [name]; monitored, no active therapy  COPD  PMR on chronic prednisone therapy  History of recurrent UTIs  GERD on PPI  Osteoporosis  CAD, history of PCI remotely  Sodium  Date Value  03/22/2017 123 mmol/L (L)  03/21/2017 124 mmol/L (L)  03/21/2017 124 mmol/L (L)  03/20/2017 128 mmol/L (L)  03/19/2017 127 mmol/L (L)  03/18/2017 130 mmol/L (L)  03/17/2017 125 mmol/L (L)  03/17/2017 127 mmol/L (L)  03/16/2017 130 mmol/L (L)  03/15/2017 130 mmol/L (L)  01/11/2017 135 mmol/L  09/27/2016 141 mEq/L  09/20/2016 139 mmol/L  12/30/2015 136 mEq/L  12/30/2014 137 mEq/L  04/15/2014 135 mEq/L (L)  06/21/2012 137 mmol/L  06/16/2012 135 mmol/L (A)   Trend in serum sodium above. Admission was 130. Today is 123. Weights are labile, admission weight listed as 63 kg and today waitlisted a 34.7 kg, I am uncertain of their accuracy.    On 10/6 her urine on his in's were 198 with a urine sodium of 52. Today her uric acid is 8.0. Serum osmolality is slightly reduced at 273.  For the past 2 days she has been lethargic, with very little oral intake. She has been diuresis with increasing doses of parenteral Lasix.  Trend in serum creatinine is below.  Urine culture on 10/7 with no growth. Blood  cultures on 10/1 with no growth, final. Given some change in mental status repeat cultures were obtained today and patient has been placed on antibiotics with cefepime and vancomycin to treat potential HCAP. CT of the chest yesterday without contrast identified bilateral lower lobe predominant pneumonia with the question if it was related to aspiration.  Results for Rebecca [name], Rebecca [name] (MRN [id number]) as of 03/22/2017 12:29  Ref. Range 03/17/2017 14:35 03/18/2017 03:51 03/19/2017 03:58 03/20/2017 03:53 03/20/2017 13:30 03/21/2017 04:07 03/21/2017 13:52 03/22/2017 04:28  Creatinine Latest Ref Range: 0.44 - 1.00 mg/dL 0.98 1.03 (H) 1.03 (H) 1.14 (H)  1.49 (H) 1.60 (H) 2.38 (H)   Daughter informs me that gabapentin was started during this admission.    I/Os: I/O last 3 completed shifts: In: 360 [P.O.:360] Out: 1450 [Urine:1450]   Balance of 12 systems is negative w/ exceptions as above  PMH  Past Medical History:  Diagnosis Date  . AKI (acute kidney injury) ([place])   . Anemia in chronic renal disease   . Aortic atherosclerosis ([place]) 01/27/2017  . CAD (coronary artery disease) 2006   a. s/p DES to RCA in 2006 b. low-risk NST in 2014 c. cath in 08/2015 showing patent RCA stent with nonobstructive disease  . CKD (chronic kidney disease) stage 4, GFR 15-29 ml/min (H[facility])   . Complication of anesthesia   . COPD (chronic obstructive pulmonary disease) ([place])   . Diabetes mellitus   . Diastolic heart failure, NYHA class 1 ([place])   . Dyslipidemia   .  Dysuria 01/06/2015  . Frozen shoulder    right  . Gastroesophageal reflux   . HAP (hospital-acquired pneumonia)   . Headache   . Hyperkalemia 01/01/2016  . Hypothyroidism   . 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 Obesity   . Pancreatic mass 01/27/2017  . Pneumonia 06/2016  . Polymyalgia rheumatica ([place])   . PONV (postoperative nausea and vomiting)   . Renal insufficiency   . Respiratory distress   . Schizoaffective disorder ([employer])    . Sepsis due to pneumonia ([place])   . Shortness of breath   . Stroke [facility])    [name]  Past Surgical History:  Procedure Laterality Date  . ABDOMINAL HYSTERECTOMY    . CARDIAC CATHETERIZATION N/A 08/13/2015   Procedure: Left Heart Cath and Coronary Angiography;  Surgeon: Jettie Booze, MD;  Location: [facility];  Service: Cardiovascular;  Laterality: N/A;  . CATARACT EXTRACTION    . CHOLECYSTECTOMY    . CORONARY ANGIOPLASTY WITH STENT PLACEMENT  2006   Taxus DES to RCA, 50 % residual  . PARTIAL HYSTERECTOMY  1995   FH  Family History  Problem Relation Age of Onset  . Diabetes Mother   . Heart disease Mother   . Heart disease Father   . Arthritis Father   . Healthy Sister   . Heart disease Brother   . Cancer Brother        colon ca  . Osteoporosis Sister   . Heart disease Brother   . Diabetes Brother   . Heart disease Brother   . Diabetes Brother   . Diabetes Brother   . Healthy Brother   . Healthy Brother   . Healthy Brother   . Cancer Maternal Uncle        blood condition  . Cancer Daughter        breast ca  . Cancer Cousin        [facility]   SH  reports that she quit smoking about 12 years ago. She has a 35.00 pack-year smoking history. She has never used smokeless tobacco. She reports that she does not drink alcohol or use drugs. Allergies  Allergies  Allergen Reactions  . Levaquin [Levofloxacin] Other (See Comments)    Ruptured tendon  . Tape Other (See Comments)    PATIENT'S SKIN IS VERY, VERY THIN AND WILL TEAR AND BRUISE EASILY; PLEASE USE AN ALTERNATIVE (SOMETHING OTHER THAN TAPE)   Home medications Prior to Admission medications   Medication Sig Start Date End Date Taking? Authorizing Provider  aspirin 81 MG EC tablet Take 81 mg by mouth daily as needed for pain. Swallow whole.   Yes [provider]  cefUROXime (CEFTIN) 250 MG tablet Take 1 tablet (250 mg total) by mouth 2 (two) times daily with a meal. 03/07/17  Yes Gorsuch, Ni, MD   cetirizine (ZYRTEC) 10 MG tablet Take 10 mg by mouth at bedtime.    Yes [provider]  clopidogrel (PLAVIX) 75 MG tablet Take 1 tablet (75 mg total) by mouth daily with breakfast. Patient taking differently: Take 75 mg by mouth at bedtime.  10/18/13  Yes Thurnell Lose, MD  cloZAPine (CLOZARIL) 100 MG tablet Take 300 mg by mouth at bedtime. 11/04/16  Yes [provider]  Cranberry (SM CRANBERRY) 300 MG tablet Take 300 mg by mouth daily.   Yes [provider]  denosumab (PROLIA) 60 MG/ML SOLN injection Inject 60 mg into the skin every 6 (six) months. Administer  in upper arm, thigh, or abdomen   Yes [provider]  diltiazem (CARDIZEM LA) 180 MG 24 hr tablet Take 180 mg by mouth daily with breakfast. 07/16/15  Yes [provider]  Fluticasone-Salmeterol (ADVAIR) 250-50 MCG/DOSE AEPB Inhale 1 puff into the lungs at bedtime.   Yes [provider]  GUAIFENESIN PO Take 400 mg by mouth 3 (three) times daily.    Yes [provider]  HYDROmorphone (DILAUDID) 2 MG tablet Take 1 tablet (2 mg total) by mouth every 4 (four) hours as needed for severe pain. 03/07/17  Yes Gorsuch, Ni, MD  ipratropium-albuterol (DUONEB) 0.5-2.5 (3) MG/3ML SOLN Inhale 3 mLs into the lungs 3 (three) times daily.  11/05/16  Yes [provider]  isosorbide mononitrate (IMDUR) 60 MG 24 hr tablet Take 1 tablet (60 mg total) by mouth daily. 03/14/13  Yes Isaiah Serge, NP  levothyroxine (SYNTHROID, LEVOTHROID) 75 MCG tablet Take 75 mcg by mouth daily before breakfast.   Yes [provider]  metoprolol succinate (TOPROL-XL) 100 MG 24 hr tablet Take 100 mg by mouth daily with breakfast. Take with or immediately following a meal.   Yes [provider]  Multiple Vitamins-Minerals (MULTIVITAMIN WITH MINERALS) tablet Take 1 tablet by mouth at bedtime.    Yes [provider]  nitrofurantoin (MACRODANTIN) 100 MG capsule Take 100 mg by mouth at  bedtime. Filled 01/11/17 x 30 days 01/11/17  Yes [provider]  nitroGLYCERIN (NITROSTAT) 0.4 MG SL tablet Place 1 tablet (0.4 mg total) under the tongue every 5 (five) minutes as needed. For chest pain 01/11/17  Yes Strader, [place] M, PA-C  Nutritional Supplements (NUTRITIONAL SHAKE HIGH PROTEIN PO) Take 330 mLs by mouth daily as needed ("When not eating enough protein").    Yes [provider]  ondansetron (ZOFRAN) 4 MG tablet Take 1 tablet (4 mg total) by mouth every 8 (eight) hours as needed for nausea or vomiting. 01/16/17  Yes Tegeler, Gwenyth Allegra, MD  pantoprazole (PROTONIX) 40 MG tablet Take 1 tablet (40 mg total) by mouth daily. 04/03/13  Yes Hilty, Nadean Corwin, MD  polyethylene glycol powder (GLYCOLAX/MIRALAX) powder Take 17 g by mouth 2 (two) times daily. MIX AND DRINK 01/28/17  Yes Samuella Cota, MD  pravastatin (PRAVACHOL) 20 MG tablet Take 20 mg by mouth at bedtime.   Yes [provider]  predniSONE (DELTASONE) 10 MG tablet Take 1 tablet (10 mg total) by mouth daily with breakfast. 10/11/16  Yes Charlynne Cousins, MD  Probiotic Product (PROBIOTIC PO) Take 1 capsule by mouth daily.   Yes [provider]  PROCTOZONE-HC 2.5 % rectal cream Apply to rectum twice a day as needed for pain. 02/25/17  Yes [provider]  senna (SENOKOT) 8.6 MG TABS tablet Take 2 tablets (17.2 mg total) by mouth at bedtime. 01/28/17  Yes Samuella Cota, MD  temazepam (RESTORIL) 15 MG capsule Take 15 mg by mouth at bedtime as needed for sleep.   Yes [provider]  Tiotropium Bromide Monohydrate (SPIRIVA RESPIMAT) 2.5 MCG/ACT AERS Inhale 2 puffs into the lungs every morning.    Yes [provider]  Vitamin D, Ergocalciferol, (DRISDOL) 50000 UNITS CAPS capsule Take 1 capsule (50,000 Units total) by mouth every 7 (seven) days. 08/27/13  Yes Kinnie Feil, MD  acetaminophen (TYLENOL) 500 MG tablet Take 1 tablet (500 mg total) by mouth every 6  (six) hours as needed (pain). Patient taking differently: Take 1,000 mg by mouth 2 (two)  times daily as needed for moderate pain.  11/21/16   Arrien, Jimmy Picket, MD  furosemide (LASIX) 20 MG tablet Take 1 tablet (20 mg total) by mouth daily as needed. Patient taking differently: Take 20 mg by mouth daily as needed for fluid or edema.  12/02/16 03/02/17  Pixie Casino, MD    Current Medications Scheduled Meds: . clopidogrel  75 mg Oral Q breakfast  . cloZAPine  300 mg Oral QHS  . diltiazem  180 mg Oral Daily  . enoxaparin (LOVENOX) injection  30 mg Subcutaneous Q24H  . fluticasone  2 spray Each Nare Daily  . gabapentin  100 mg Oral TID  . guaiFENesin  600 mg Oral BID  . insulin aspart  0-5 Units Subcutaneous QHS  . insulin aspart  0-9 Units Subcutaneous TID WC  . ipratropium-albuterol  3 mL Inhalation TID  . isosorbide mononitrate  60 mg Oral Daily  . levothyroxine  75 mcg Oral QAC breakfast  . loratadine  10 mg Oral Daily  . metoprolol tartrate  5 mg Intravenous Q8H  . mometasone-formoterol  2 puff Inhalation BID  . pantoprazole (PROTONIX) IV  40 mg Intravenous Q24H  . polyethylene glycol  17 g Oral BID  . pravastatin  20 mg Oral QHS  . predniSONE  10 mg Oral Q breakfast  . protein supplement shake  11 oz Oral BID BM  . senna-docusate  1 tablet Oral BID  . sodium chloride HYPERTONIC  4 mL Nebulization Daily  . tamsulosin  0.4 mg Oral Daily   Continuous Infusions: . ceFEPime (MAXIPIME) IV Stopped (03/22/17 1010)   PRN Meds:.acetaminophen, aspirin EC, HYDROmorphone, ipratropium-albuterol, ondansetron **OR** ondansetron (ZOFRAN) IV, temazepam  CBC  Recent Labs Lab 03/20/17 1330 03/21/17 0407 03/22/17 0428  WBC 3.5* 17.2* 19.8*  NEUTROABS 1.6* 13.4* 17.7*  HGB 11.1* 9.5* 9.3*  HCT 32.2* 27.3* 27.3*  MCV 90.4 88.9 89.8  PLT 118* 221 875   Basic Metabolic Panel  Recent Labs Lab 03/17/17 1435 03/18/17 0351 03/19/17 0358 03/20/17 0353 03/21/17 0407  03/21/17 1352 03/22/17 0428  NA 125* 130* 127* 128* 124* 124* 123*  K 5.1 4.1 4.5 4.0 3.9 3.9 4.6  CL 93* 96* 92* 89* 82* 83* 84*  CO2 21* 26 28 30 30 27 26   GLUCOSE 190* 95 104* 122* 101* 137* 118*  BUN 22* 25* 23* 29* 36* 38* 46*  CREATININE 0.98 1.03* 1.03* 1.14* 1.49* 1.60* 2.38*  CALCIUM 8.7* 8.7* 8.6* 8.9 8.8* 8.8* 8.2*    Physical Exam  Blood pressure (!) 91/38, pulse 78, temperature 99.1 F (37.3 C), temperature source Oral, resp. rate 16, height 5\' 3"  (1.6 m), weight 64.4 kg (142 lb), SpO2 96 %. GEN: somnolent ENT: NCAT CV: 2/6 MSM, regular PULM: diminished at bases ABD: s/nt/nd SKIN: no rashes/lesions EXT:No LEE   Assessment 53F with significant comorbidities with AoCKD4 and progressive mild hyponatremia  1. AoCKD, suspected related to recent diruesis + poor PO; diuretics now held. Sees [place] CKA, SCr labile in 1s at baseline 2. Hyponatremia, mildly hypotonic; volume status is unclear but I think either hypo or euvolemic.  UOsm were up some.  Low GFRmakes it harder to interpret.  My best guess here is ? Low solute effect, some extra ADH on board.   3. HCAP on cefepime and vanc based on 10/8 CT Chest 4. AMS / Lethargy 5. Schizoaffective disorder, on clozaril 6. COPD 7. MGUS  Plan 1. Stop gabapentin 2. Gently NS hydration, 10mL/hr x 24h 3. BID  BMP 4. If safe for PO, push protein   Pearson Grippe MD [phone] pgr 03/22/2017, 12:28 PM

## 2017-03-22 NOTE — Progress Notes (Signed)
Initial Nutrition Assessment  DOCUMENTATION CODES:   Non-severe (moderate) malnutrition in context of chronic illness  INTERVENTION:   -Provide Premier Protein BID, each supplement provides 160 kcal and 30 grams of protein.  -Downgraded diet to Dysphagia 1 per family request -RD will continue to monitor  NUTRITION DIAGNOSIS:   Malnutrition(moderate) related to chronic illness, dysphagia as evidenced by mild depletion of body fat, moderate depletions of muscle mass.  GOAL:   Patient will meet greater than or equal to 90% of their needs  MONITOR:   PO intake, Supplement acceptance, Labs, Weight trends, I & O's  REASON FOR ASSESSMENT:   Low [place]    ASSESSMENT:   Pt. with PMH of COPD, chronic prednisone therapy, CAD status post DES to RCA in 2006, CKD stage IV, diastolic heart failure, MGUS, hypothyroidism ; admitted on 03/14/2017, presented with complaint of right hip pain, was found to have muscular pain.  Patient sleeping with daughter at bedside. Daughter provided most of history. Pt was eating well at home. Pt's daughter fixes homemade meals and purees everything the patient consumes.Pt with difficulty chewing solid foods. Pt doesn't eat meat every day d/t poor digestion so pt is provided meat an average of 3 times weekly, typically lean meats such as chicken or [place] with some fish. Pt is provided [employer] nutritional supplements when not eating well or sick at home. Pt's daughter states she was told by a MD to watch patient's potassium intake. Daughter uses [employer] website to make sure she is providing the right amounts of nutrients in the patient's meals.   Pt's intake has been variable. Most recently consuming 60-75% of meals. This morning she has not been eating much d/t lethargy and sleeping a lot. Pt's daughter would like a protein supplement to be added to orders. Will order Premier Protein BID.   Pt's weight was stable since May 2018. Since admission she has gained 15 lb  d/t fluid. On IV Lasix now. Nutrition-Focused physical exam completed. Findings are mild fat depletion, moderate muscle depletion, and no edema.  Medications: IV Protonix daily, Miralax packet BID, Senokot-S tablet BID Labs reviewed: CBGs: 112-128 Low Na GFR: 18  Diet Order:  DIET - DYS 1 Room service appropriate? Yes with Assist; Fluid consistency: Thin; Fluid restriction: 1200 mL Fluid  Skin:  Reviewed, no issues  Last BM:  10/8  Height:   Ht Readings from Last 1 Encounters:  03/14/17 5\' 3"  (1.6 m)    Weight:   Wt Readings from Last 1 Encounters:  03/22/17 142 lb (64.4 kg)    Ideal Body Weight:  52.3 kg  BMI:  Body mass index is 25.15 kg/m.  Estimated Nutritional Needs:   Kcal:  1600-1800  Protein:  60-70g  Fluid:  Per MD  EDUCATION NEEDS:   Education needs addressed  Clayton Bibles, MS, RD, LDN Pager: [phone] After Hours Pager: [phone]

## 2017-03-22 NOTE — Progress Notes (Signed)
PHARMACY NOTE:  ANTIMICROBIAL RENAL DOSAGE ADJUSTMENT  Current antimicrobial regimen includes a mismatch between antimicrobial dosage and estimated renal function.  As per policy approved by the Pharmacy & Therapeutics and Medical Executive Committees, the antimicrobial dosage will be adjusted accordingly.  Current antimicrobial dosage:  Cefepime 1g q8  Indication: PNA  Renal Function:  Estimated Creatinine Clearance: 18.3 mL/min (A) (by C-G formula based on SCr of 2.38 mg/dL (H)). []      On intermittent HD, scheduled: []      On CRRT    Antimicrobial dosage has been changed to:  Cefepime 1g q24  Additional comments:   Thank you for allowing pharmacy to be a part of this patient's care.  Kara Mead, [facility] 03/22/2017 8:47 AM

## 2017-03-22 NOTE — Progress Notes (Signed)
PROGRESS NOTE    Rebecca [name]  TDD:[id number] DOB: Mar 20, 1941 DOA: 03/14/2017 PCP: [name], MD   Brief Narrative:  Pt. with PMH of COPD, chronic prednisone therapy, CAD status post DES to RCA in 2006, CKD stage IV, diastolic heart failure, MGUS, hypothyroidism ; admitted on 03/14/2017, presented with complaint of right hip pain, was found to have muscular pain. Currently further plan is pain control.   Assessment & Plan:   Principal Problem:   Acute right hip pain Active Problems:   HCAP (healthcare-associated pneumonia)   Hypothyroidism   Dyslipidemia   Anemia in chronic renal disease   GASTROESOPHAGEAL REFLUX, NO ESOPHAGITIS   Chronic diastolic (congestive) heart failure (HCC)   Polymyalgia rheumatica (HCC)   MGUS (monoclonal gammopathy of unknown significance)   CKD stage 4, GFR 15-29 ml/min    Essential hypertension   AKI (acute kidney injury) (HCC)   Pneumonia   Chronic constipation   Chronic pain disorder   Pain of right hip joint   Hypervolemia   Lethargy   #1 right hip pain Likely secondary to L5-S1 spondylosis seen on MRI of the L-spine in 2015 with muscular pain in the gluteal region. CT right hip which was done 03/15/2017 with no acute osseous abnormalities, mild degenerative changes of the right hip joint with questionable chondrocalcinosis which could be age-related or reflect CPPD arthropathy. Patient still with significant pain and stated IV Dilaudid helps her pain however only lasted about 2 hours. Per family patient's oral Dilaudid dose was increased to 4 mg as needed per her hematologist/oncologist just prior to admission.   Patient's pain seems to be under better control. Due to lethargy patient's long-acting MS Contin, IV Dilaudid were discontinued. Oral Dilaudid dose was decreased. Neurontin has been discontinued.  It is noted that patient scheduled to see an interventional pain doctors as an outpatient for her back pain and may benefit from a  spinal/steroid injection. PT/OT.  #2 volume overload Patient sounds diffusely rhonchorous on lung exam with probable diffuse rales. Patient with a increase in her weight from admission and as of 03/21/2017 patient's weight was 70 kg from 69.4 kg from 63 kg on admission 03/14/2017. Patient on IV Lasix with a urine output of 1.3 L over the past 24 hours. Current weight today 64.4 kg. Chest x-ray with no significant acute abnormalities. BNP elevated. Discontinue IV Lasix. Patient currently either euvolemic or hypovolemic. Strict I's and O's. Daily weights.  #3 lethargy Patient lethargic and drowsy however opens eyes to verbal stimuli and answering questions appropriately. ?? Etiology. May be secondary to worsening hyponatremia versus HCAP. Daughter was concerned that her mother likely has a urinary tract infection or some form of infection that is not being treated. Bladder scan did show some urinary retention and a such Foley cath was placed and patient started on Flomax. Patient noted to be constipated and patient given a smog enema with significant results. Abdomen is less tense and softer. BNP was noted to be elevated. Urinalysis with trace leukocytes nitrite -6-30 WBCs. Urine cultures pending. Chest x-ray with no significant acute abnormalities. Discontinue IV Lasix secondary to worsening hyponatremia. CT head negative for any acute abnormalities. Ammonia levels within normal limits. CT chest concerning for bibasilar pneumonia. ABG with no significant changes.  Discontinued long-acting MS Contin. Decreased breakthrough oral Dilaudid to 1 mg every 4 hours as needed. IV Dilaudid has been discontinued. Strict I's and O's. Daily weights.  #4 healthcare associated pneumonia- Chest x-ray obtained on admission had some concerns  for possible pneumonia. Patient was seen in consultation by ID patient clinically does not have a pneumonia. Blood cultures obtained with no growth to date. IV antibiotics were  discontinued by ID recommendations. Patient remained afebrile. Patient with a chronic leukocytosis. Patient noted to be lethargic and a such CT chest was obtained without contrast which was concerning for bilateral lower lobe predominant pneumonia with dependent distribution concern for aspiration pneumonia especially given debris within the esophagus. Follow.  #5 repeated UTIs/colonization Patient has had multiple rounds of antibiotics for UTIs last urine culture 03/04/2017 showing pseudomonas aeruginosa. Patient with a chronically elevated leukocytosis on chronic prednisone therapy. Urine cultures positive for Pseudomonas. Patient has been seen in consultation by ID who feel that patient likely has colonization rather than a true acute infection. IV antibiotics have been discontinued. Monitor.  #6 concerning for ascites--ruled out It was noted that patient was supposed to get a paracentesis as outpatient ordered per patient's oncologist. Patient daughter had insisted that ultrasound and paracentesis be done during this hospitalization and as such abdominal ultrasound was ordered which was negative for ascites. Outpatient follow-up.  #7 coronary artery disease Patient asymptomatic. Patient with rhonchorous breath sounds and likely diffuse rales on examination. Patient however denies shortness of breath or chest pain. BNP elevated in the 600s. Chest x-ray with no significant abnormalities. Continue current regimen of aspirin and Plavix, Cardizem CD, Imdur, Toprol-XL, statin. Discontinue IV Lasix with worsening renal function and hyponatremia. Patient was to get a spinal injection in the outpatient setting Plavix will likely need to be discontinued at that time however will defer to pain management doctor at this time.  #8 COPD/polymyalgia rheumatica Stable. Continue home regimen of prednisone 10 mg daily. Patient on chronic prednisone and increased risk for avascular necrosis, pathologic fracture,  osteoporosis. Continue Motrin as needed.  #9 MGUS Outpatient follow-up with hematology/oncology.  #10 hypertension Blood pressure in the 90s today and as such will hold morning doses of antihypertensive medications today. Once blood pressure improves may resume Cardizem CD, Imdur, Toprol XL.   #11 constipation Continue current regimen of MiraLAX twice daily, Senokot S twice daily. Patient with abdominal distention and probable urinary retention 03/20/2017 which improved after smog enema was given with good results.  #12 urinary retention Patient was noted to have urinary retention 03/20/2017 and foley catheter placed. Patient started on Flomax.  #13 dysphagia During last admission speech therapy evaluated the patient and recommended dysphagia 3 diet. It is noted that patient's daughter states patient on a regular diet at home. Patient tolerating current diet of pured food which per nursing daughter orders. SLP for swallow evaluation as patient now with bilateral basilar pneumonia concerning for possible aspiration pneumonia.   #14  hyponatremia ?? Acute on Chronic hyponatremia. ?? SIADH versus hypovolemia. Patient with a worsening hyponatremia initially felt may have been secondary to hypervolemic hyponatremia as hyponatremia improved with some diuresis initially. Urine osmolality was 168 and low, urine sodium was 29, serum osmolality was decreased at 272. Sodium levels worsened after gentle hydration. IV fluids were discontinued. TSH within normal limits at 0.898. Patient given Lasix 40 mg IV 1 03/17/2017 with some improvement with hyponatremia. Patient given Lasix 20 mg IV 1 03/18/2017 with a slight decrease in sodium levels. Patient current weight of 64.4 kg from 70 kg 03/21/2017 from 69.4 kg 03/20/2017 from 68 kg on 03/18/2017 from 63 kg on 03/14/2017. Patient given Lasix 60 mg IV the morning of 03/21/2017 and then 80 mg IV the evening of 03/21/2017. Urine  output over the past 24 hours is  not properly recorded. Sodium level now at 123 with no significant improvement. Repeat urine and serum osmolalities, urine sodium, urine creatinine, uric acid level. Strict I's and O's. Daily weights. Consult with nephrology for further evaluation and management.    DVT prophylaxis: Lovenox Code Status: Full Family Communication: Updated daughter at bedside. Disposition Plan: Likely skilled nursing facility when medically stable.    Consultants:   Infectious disease: Dr.Comer 03/15/2017  Nephrology: Dr. Joelyn Oms 03/22/2017  Procedures:   CT right hip 03/15/2017  Chest x-ray 03/14/2017, 03/20/2017  Abdominal ultrasound 03/15/2017  Plain films of the right hip and pelvis 03/14/2017  CT head 03/21/2017  CT chest 03/21/2017  Antimicrobials:   IV vancomycin 03/14/2017>>>>> 03/15/2017  IV Zosyn 03/14/2017>>>> 03/15/2017  IV vancomycin 04/01/2017  IV cefepime 03/22/2017   Subjective: Patient sleeping. Per daughter no chest pain no shortness of breath. Per daughter patient with poor oral intake over the past 24 hours due to lethargy and sleepiness.  Objective: Vitals:   03/22/17 0532 03/22/17 0852 03/22/17 0930 03/22/17 1000  BP: (!) 104/42   (!) 91/38  Pulse: 87   78  Resp: 16     Temp: 99.1 F (37.3 C)     TempSrc: Oral     SpO2: 96% 96%    Weight:   64.4 kg (142 lb)   Height:        Intake/Output Summary (Last 24 hours) at 03/22/17 1312 Last data filed at 03/22/17 0140  Gross per 24 hour  Intake                0 ml  Output              550 ml  Net             -550 ml   Filed Weights   03/20/17 0442 03/21/17 0444 03/22/17 0930  Weight: 69.4 kg (153 lb) 70 kg (154 lb 5.2 oz) 64.4 kg (142 lb)    Examination:  General exam: Sleeping. Respiratory system: Diffuse less rhonchorous, breath sounds with rales. Respiratory effort normal. Cardiovascular system: Regular rate and rhythm. No murmurs rubs or gallops. No lower extremity edema.  Gastrointestinal  system: Abdomen is soft, nontender, nondistended, positive bowel sounds.  Central nervous system: Sleeping. Extremities: Symmetric 5 x 5 power. Skin: Rash on back Psychiatry: Judgement and insight appear fair. Mood & affect appropriate.     Data Reviewed: I have personally reviewed following labs and imaging studies  CBC:  Recent Labs Lab 03/16/17 0353 03/20/17 1330 03/21/17 0407 03/22/17 0428  WBC 18.6* 3.5* 17.2* 19.8*  NEUTROABS 14.4* 1.6* 13.4* 17.7*  HGB 9.0* 11.1* 9.5* 9.3*  HCT 26.0* 32.2* 27.3* 27.3*  MCV 89.3 90.4 88.9 89.8  PLT 206 118* 221 740   Basic Metabolic Panel:  Recent Labs Lab 03/19/17 0358 03/20/17 0353 03/21/17 0407 03/21/17 1352 03/22/17 0428  NA 127* 128* 124* 124* 123*  K 4.5 4.0 3.9 3.9 4.6  CL 92* 89* 82* 83* 84*  CO2 28 30 30 27 26   GLUCOSE 104* 122* 101* 137* 118*  BUN 23* 29* 36* 38* 46*  CREATININE 1.03* 1.14* 1.49* 1.60* 2.38*  CALCIUM 8.6* 8.9 8.8* 8.8* 8.2*   GFR: Estimated Creatinine Clearance: 17.6 mL/min (A) (by C-G formula based on SCr of 2.38 mg/dL (H)). Liver Function Tests:  Recent Labs Lab 03/16/17 0353  AST 15  ALT 12*  ALKPHOS 31*  BILITOT 0.3  PROT 5.5*  ALBUMIN  2.8*   No results for input(s): LIPASE, AMYLASE in the last 168 hours.  Recent Labs Lab 03/21/17 1352  AMMONIA 12   Coagulation Profile: No results for input(s): INR, PROTIME in the last 168 hours. Cardiac Enzymes: No results for input(s): CKTOTAL, [name], [name], TROPONINI in the last 168 hours. BNP (last 3 results) No results for input(s): PROBNP in the last 8760 hours. HbA1C: No results for input(s): HGBA1C in the last 72 hours. CBG:  Recent Labs Lab 03/21/17 1147 03/21/17 1712 03/21/17 2222 03/22/17 0819 03/22/17 1235  GLUCAP 115* 151* 128* 112* 124*   Lipid Profile: No results for input(s): CHOL, [name], [name], [name], [name], LDLDIRECT in the last 72 hours. Thyroid Function Tests: No results for input(s): TSH, T4TOTAL,  FREET4, T3FREE, THYROIDAB in the last 72 hours. Anemia Panel: No results for input(s): VITAMINB12, FOLATE, FERRITIN, TIBC, IRON, RETICCTPCT in the last 72 hours. Sepsis Labs: No results for input(s): PROCALCITON, LATICACIDVEN in the last 168 hours.  Recent Results (from the past 240 hour(s))  Blood culture (routine x 2)     Status: None   Collection Time: 03/14/17  1:45 PM  Result Value Ref Range Status   Specimen Description BLOOD LEFT ANTECUBITAL  Final   Special Requests   Final    BOTTLES DRAWN AEROBIC AND ANAEROBIC Blood Culture adequate volume   Culture   Final    NO GROWTH 5 DAYS Performed at [facility], [address], [place], [place] [address]    Report Status 03/19/2017 FINAL  Final  Blood culture (routine x 2)     Status: None   Collection Time: 03/14/17  1:48 PM  Result Value Ref Range Status   Specimen Description BLOOD RIGHT ANTECUBITAL  Final   Special Requests   Final    BOTTLES DRAWN AEROBIC AND ANAEROBIC Blood Culture adequate volume   Culture   Final    NO GROWTH 5 DAYS Performed at [facility], [place] [address], [place], [place] [address]    Report Status 03/19/2017 FINAL  Final  Urine culture     Status: Abnormal   Collection Time: 03/14/17  2:44 PM  Result Value Ref Range Status   Specimen Description URINE, CATHETERIZED  Final   Special Requests NONE  Final   Culture >=100,000 COLONIES/mL PSEUDOMONAS AERUGINOSA (A)  Final   Report Status 03/16/2017 FINAL  Final   Organism ID, Bacteria PSEUDOMONAS AERUGINOSA (A)  Final      Susceptibility   Pseudomonas aeruginosa - MIC*    CEFTAZIDIME 4 SENSITIVE Sensitive     CIPROFLOXACIN <=0.25 SENSITIVE Sensitive     GENTAMICIN <=1 SENSITIVE Sensitive     IMIPENEM 2 SENSITIVE Sensitive     PIP/TAZO 8 SENSITIVE Sensitive     CEFEPIME 2 SENSITIVE Sensitive     * >=100,000 COLONIES/mL PSEUDOMONAS AERUGINOSA  Culture, Urine     Status: None   Collection Time: 03/20/17 12:23 PM  Result Value Ref Range  Status   Specimen Description URINE, CATHETERIZED  Final   Special Requests NONE  Final   Culture   Final    NO GROWTH Performed at [facility], [place] [address] [place],  [address]    Report Status 03/22/2017 FINAL  Final         Radiology Studies: Dg Chest 2 View  Result Date: 03/20/2017 CLINICAL DATA:  76 y/o F; weakness and cough. History of CAD, COPD, pneumonia, diabetes, and stroke. History of cardiac cath in 2017. Former  smoker. EXAM: CHEST  2 VIEW COMPARISON:  03/14/2017, 09/13/2016, 08/10/2015 chest radiograph. 01/26/2017 CT chest. FINDINGS: Stable normal cardiac silhouette given projection and technique. Aortic atherosclerosis with calcification. Improved aeration of the left lung base with mild residual streaky opacity. No new consolidation, effusion, or pneumothorax. Degenerative changes of bilateral glenohumeral joints and the thoracic spine. No acute osseous abnormality is evident. IMPRESSION: 1. Improved aeration of the left lung base with mild residual streaky opacity similar to prior radiographs, probably scarring and atelectasis. No new focal consolidation. 2. Aortic atherosclerosis. Electronically Signed   By: Kristine Garbe M.D.   On: 03/20/2017 16:43   Ct Head Wo Contrast  Result Date: 03/21/2017 CLINICAL DATA:  76 year old female with increasing drowsiness and lethargy. EXAM: CT HEAD WITHOUT CONTRAST TECHNIQUE: Contiguous axial images were obtained from the base of the skull through the vertex without intravenous contrast. COMPARISON:  Head CT 01/25/2017. FINDINGS: Brain: Patchy and confluent areas of decreased attenuation are noted throughout the deep and periventricular white matter of the cerebral hemispheres bilaterally, compatible with chronic microvascular ischemic disease. Well-defined areas of low attenuation lesions in the basal ganglia bilaterally, compatible with old lacunar infarcts. No evidence of acute infarction, hemorrhage,  hydrocephalus, extra-axial collection or mass lesion/mass effect. Vascular: No hyperdense vessel or unexpected calcification. Skull: Normal. Negative for fracture or focal lesion. Sinuses/Orbits: Small left mastoid effusion is unchanged. No acute finding. Other: None. IMPRESSION: 1. No acute intracranial abnormalities. 2. 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 3. Small left mastoid effusion is unchanged. Electronically Signed   By: Vinnie Langton M.D.   On: 03/21/2017 15:51   Ct Chest Wo Contrast  Result Date: 03/21/2017 CLINICAL DATA:  COPD.  Possible pneumonia. EXAM: CT CHEST WITHOUT CONTRAST TECHNIQUE: Multidetector CT imaging of the chest was performed following the standard protocol without IV contrast. COMPARISON:  Chest radiograph 03/20/2017 FINDINGS: Cardiovascular: Normal heart size. No pericardial effusion. Ectasia and calcific atherosclerotic disease of the thoracic aorta. Irregularity of the contour of the aorta at the thoracic arch, with maximum diameter of 2.7 cm. Crescent-shaped calcifications along the lateral border the proximal abdominal aorta, image 137/152, sequence 3. Mediastinum/Nodes: No enlarged mediastinal or axillary lymph nodes. Thyroid gland, and trachea demonstrate no significant findings. Debris within the dependent portion of the esophagus. Lungs/Pleura: Patchy areas of airspace consolidation in the right greater than left lower lobe in dependent distribution. Smaller areas of airspace consolidation extend into the bilateral upper lobes. Upper Abdomen: No acute abnormality. Musculoskeletal: Apparent interruption of the right proximal humeral cortex may be artifactual. Multilevel osteoarthritic changes of the thoracic spine. IMPRESSION: Ectasia and calcific atherosclerotic disease of the visualized aorta. Irregularity of the aortic contour at the thoracic arch,  and intraluminal calcifications at the thoracic arch and at the level of the proximal abdominal aorta may represent rim calcified atherosclerotic plaque versus dissection of the aorta, incompletely evaluated due to lack of IV contrast. Bilateral lower lobe predominant pneumonia with dependent distribution. Query aspiration pneumonia, especially given debris within the esophagus. Apparent interruption of the cortex of the right proximal humerus, which may be due to motion artifact rather than representing a true fracture. Please correlate to possible point of tenderness. These results will be called to the ordering clinician or representative by the Radiologist Assistant, and communication documented in the PACS or zVision Dashboard. Electronically Signed   By: Fidela Salisbury M.D.   On: 03/21/2017 19:07  Scheduled Meds: . clopidogrel  75 mg Oral Q breakfast  . cloZAPine  300 mg Oral QHS  . diltiazem  180 mg Oral Daily  . enoxaparin (LOVENOX) injection  30 mg Subcutaneous Q24H  . fluticasone  2 spray Each Nare Daily  . guaiFENesin  600 mg Oral BID  . insulin aspart  0-5 Units Subcutaneous QHS  . insulin aspart  0-9 Units Subcutaneous TID WC  . ipratropium-albuterol  3 mL Inhalation TID  . isosorbide mononitrate  60 mg Oral Daily  . levothyroxine  75 mcg Oral QAC breakfast  . loratadine  10 mg Oral Daily  . metoprolol tartrate  5 mg Intravenous Q8H  . mometasone-formoterol  2 puff Inhalation BID  . pantoprazole (PROTONIX) IV  40 mg Intravenous Q24H  . polyethylene glycol  17 g Oral BID  . pravastatin  20 mg Oral QHS  . predniSONE  10 mg Oral Q breakfast  . protein supplement shake  11 oz Oral BID BM  . senna-docusate  1 tablet Oral BID  . sodium chloride HYPERTONIC  4 mL Nebulization Daily  . tamsulosin  0.4 mg Oral Daily   Continuous Infusions: . sodium chloride 75 mL/hr at 03/22/17 1254  . ceFEPime (MAXIPIME) IV Stopped (03/22/17 1010)     LOS: 6 days    Time spent:  51 minutes    Keelynn Furgerson, MD [facility] Pager [phone]  If 7PM-7AM, please contact night-coverage [email or web address] Password [email or web address] 03/22/2017, 1:12 PM

## 2017-03-23 LAB — CBC WITH DIFFERENTIAL/PLATELET
Basophils Absolute: 0 10*3/uL (ref 0.0–0.1)
Basophils Relative: 0 %
Eosinophils Absolute: 0.1 10*3/uL (ref 0.0–0.7)
Eosinophils Relative: 0 %
HEMATOCRIT: 26.7 % — AB (ref 36.0–46.0)
HEMOGLOBIN: 9.2 g/dL — AB (ref 12.0–15.0)
LYMPHS ABS: 1.1 10*3/uL (ref 0.7–4.0)
LYMPHS PCT: 8 %
MCH: 30.3 pg (ref 26.0–34.0)
MCHC: 34.5 g/dL (ref 30.0–36.0)
MCV: 87.8 fL (ref 78.0–100.0)
Monocytes Absolute: 0.8 10*3/uL (ref 0.1–1.0)
Monocytes Relative: 6 %
NEUTROS ABS: 12.2 10*3/uL — AB (ref 1.7–7.7)
NEUTROS PCT: 86 %
Platelets: 193 10*3/uL (ref 150–400)
RBC: 3.04 MIL/uL — AB (ref 3.87–5.11)
RDW: 13.2 % (ref 11.5–15.5)
WBC: 14.2 10*3/uL — AB (ref 4.0–10.5)

## 2017-03-23 LAB — GLUCOSE, CAPILLARY
GLUCOSE-CAPILLARY: 293 mg/dL — AB (ref 65–99)
GLUCOSE-CAPILLARY: 219 mg/dL — AB (ref 65–99)
Glucose-Capillary: 96 mg/dL (ref 65–99)

## 2017-03-23 LAB — BASIC METABOLIC PANEL
ANION GAP: 13 (ref 5–15)
ANION GAP: 11 (ref 5–15)
BUN: 59 mg/dL — ABNORMAL HIGH (ref 6–20)
BUN: 61 mg/dL — AB (ref 6–20)
CHLORIDE: 90 mmol/L — AB (ref 101–111)
CHLORIDE: 94 mmol/L — AB (ref 101–111)
CO2: 25 mmol/L (ref 22–32)
CO2: 25 mmol/L (ref 22–32)
CREATININE: 2.18 mg/dL — AB (ref 0.44–1.00)
Calcium: 8 mg/dL — ABNORMAL LOW (ref 8.9–10.3)
Calcium: 8.4 mg/dL — ABNORMAL LOW (ref 8.9–10.3)
Creatinine, Ser: 1.8 mg/dL — ABNORMAL HIGH (ref 0.44–1.00)
GFR calc Af Amer: 30 mL/min — ABNORMAL LOW (ref >60)
GFR calc non Af Amer: 20 mL/min — ABNORMAL LOW (ref >60)
GFR calc non Af Amer: 26 mL/min — ABNORMAL LOW (ref >60)
GFR, EST AFRICAN AMERICAN: 24 mL/min — AB (ref >60)
GLUCOSE: 249 mg/dL — AB (ref 65–99)
Glucose, Bld: 127 mg/dL — ABNORMAL HIGH (ref 65–99)
POTASSIUM: 4 mmol/L (ref 3.5–5.1)
POTASSIUM: 4.2 mmol/L (ref 3.5–5.1)
Sodium: 128 mmol/L — ABNORMAL LOW (ref 135–145)
Sodium: 130 mmol/L — ABNORMAL LOW (ref 135–145)

## 2017-03-23 LAB — MRSA PCR SCREENING: MRSA BY PCR: NEGATIVE

## 2017-03-23 LAB — LEGIONELLA PNEUMOPHILA SEROGP 1 UR AG: L. pneumophila Serogp 1 Ur Ag: NEGATIVE

## 2017-03-23 LAB — VANCOMYCIN, TROUGH: Vancomycin Tr: 22 ug/mL (ref 15–20)

## 2017-03-23 LAB — HIV ANTIBODY (ROUTINE TESTING W REFLEX): HIV Screen 4th Generation wRfx: NONREACTIVE

## 2017-03-23 MED ORDER — DIPHENHYDRAMINE-ZINC ACETATE 2-0.1 % EX CREA
TOPICAL_CREAM | Freq: Three times a day (TID) | CUTANEOUS | Status: DC | PRN
  Administered 2017-03-24: 14:00:00 via TOPICAL
  Filled 2017-03-23: qty 28

## 2017-03-23 MED ORDER — VANCOMYCIN HCL IN DEXTROSE 1-5 GM/200ML-% IV SOLN: 1000.0000 mg | INTRAVENOUS | Status: DC

## 2017-03-23 MED ORDER — ACETAMINOPHEN 500 MG PO TABS
500.0000 mg | ORAL_TABLET | Freq: Three times a day (TID) | ORAL | Status: DC
Start: 2017-03-23 — End: 2017-03-26
  Administered 2017-03-23 – 2017-03-26 (×10): 500 mg via ORAL
  Filled 2017-03-23 (×11): qty 1

## 2017-03-23 NOTE — Progress Notes (Signed)
Critical vanco level of 22 paged to on call.

## 2017-03-23 NOTE — Progress Notes (Signed)
Pt. c/o chest wall pain and states that she hurts, especially when we"clap" on her chest and does not want this anymore.  Assessment done and documented.

## 2017-03-23 NOTE — Progress Notes (Addendum)
PROGRESS NOTE    JAKE GOODSON  DDU:[id number] DOB: [birth year] DOA: 03/14/2017 PCP: [name], MD   Brief Narrative:  Pt. with PMH of COPD, chronic prednisone therapy, CAD status post DES to RCA in 2006, CKD stage IV, diastolic heart failure, MGUS, hypothyroidism ; admitted on 03/14/2017, presented with complaint of right hip pain, was found to have muscular pain. Currently further plan is pain control.   Assessment & Plan:   Principal Problem:   Acute right hip pain Active Problems:   HCAP (healthcare-associated pneumonia)   Hypothyroidism   Dyslipidemia   Anemia in chronic renal disease   GASTROESOPHAGEAL REFLUX, NO ESOPHAGITIS   Chronic diastolic (congestive) heart failure (HCC)   Polymyalgia rheumatica (HCC)   MGUS (monoclonal gammopathy of unknown significance)   CKD stage 4, GFR 15-29 ml/min    Essential hypertension   AKI (acute kidney injury) (HCC)   Pneumonia   Chronic constipation   Chronic pain disorder   Pain of right hip joint   Hypervolemia   Lethargy   Weakness   #1 right hip pain Likely secondary to L5-S1 spondylosis seen on MRI of the L-spine in 2015 with muscular pain in the gluteal region. CT right hip which was done 03/15/2017 with no acute osseous abnormalities, mild degenerative changes of the right hip joint with questionable chondrocalcinosis which could be age-related or reflect CPPD arthropathy. Patient still with significant pain and stated IV Dilaudid helped her pain however only lasted about 2 hours. Per family patient's oral Dilaudid dose was increased to 4 mg as needed per her hematologist/oncologist just prior to admission.   Patient's pain seemed to be under better control. Due to lethargy patient's long-acting MS Contin, IV Dilaudid were discontinued. Oral Dilaudid dose was decreased. Neurontin has been discontinued.  It is noted that patient scheduled to see an interventional pain doctors as an outpatient for her back pain and may benefit  from a spinal/steroid injection. PT/OT. Daughter insistent on patient being assessed by orthopedics either [facility](Dr Aluisio) or [facility](Dr Tremonton).  #2 volume overload Patient sounded diffusely rhonchorous on lung exam with probable diffuse rales over past few days. Patient with a increase in her weight from admission and as of 03/21/2017 patient's weight was 70 kg from 69.4 kg from 63 kg on admission 03/14/2017. Patient on IV Lasix with a urine output of 1.3 L. Current weight 64.4 kg (03/22/2017). Chest x-ray with no significant acute abnormalities. BNP elevated. Discontinued IV Lasix. Patient currently either euvolemic or hypovolemic. Strict I's and O's. Daily weights.  #3 lethargy/acute metabolic encephalopathy Patient was lethargic and drowsy on 03/22/2017. Patient with significant improvement alert and open and sitting in the chair. Patient interacting. ?? Etiology. Likely secondary to worsening hyponatremia and HCAP.  Daughter was concerned that her mother likely has a urinary tract infection or some form of infection that is not being treated. Bladder scan did show some urinary retention and a such Foley cath was placed and patient started on Flomax. Patient noted to be constipated and patient given a smog enema with significant results. Abdomen is less tense and softer. BNP was noted to be elevated. Urinalysis with trace leukocytes nitrite -6-30 WBCs. Urine cultures negative. Chest x-ray with no significant acute abnormalities. Discontinued IV Lasix secondary to worsening hyponatremia. CT head negative for any acute abnormalities. Ammonia levels within normal limits. CT chest concerning for bibasilar pneumonia. ABG with no significant changes.  Discontinued long-acting MS Contin. Decreased breakthrough oral Dilaudid to 1 mg every 4  hours as needed. IV Dilaudid has been discontinued. Strict I's and O's. Daily weights.  #4 healthcare associated pneumonia- Chest x-ray  obtained on admission had some concerns for possible pneumonia. Patient was seen in consultation by ID patient clinically does not have a pneumonia. Blood cultures obtained with no growth to date. IV antibiotics were discontinued by ID recommendations. Patient remained afebrile. Patient with a chronic leukocytosis. Patient noted to be lethargic and as such CT chest was obtained without contrast which was concerning for bilateral lower lobe predominant pneumonia with dependent distribution concern for aspiration pneumonia especially given debris within the esophagus. Sputum Gram stain and culture pending. Urine Legionella antigen negative. Urine pneumococcus antigen negative. MRSA PCR negative. Discontinue IV vancomycin. Continue IV cefepime. Follow.  #5 repeated UTIs/colonization Patient has had multiple rounds of antibiotics for UTIs last urine culture 03/04/2017 showing pseudomonas aeruginosa. Patient with a chronically elevated leukocytosis on chronic prednisone therapy. Urine cultures positive for Pseudomonas. Patient has been seen in consultation by ID who feel that patient likely has colonization rather than a true acute infection. IV antibiotics have been discontinued. Monitor.  #6 concerning for ascites--ruled out It was noted that patient was supposed to get a paracentesis as outpatient ordered per patient's oncologist. Patient daughter had insisted that ultrasound and paracentesis be done during this hospitalization and as such abdominal ultrasound was ordered which was negative for ascites. Outpatient follow-up.  #7 coronary artery disease Patient asymptomatic. Patient with rhonchorous breath sounds and likely diffuse rales on examination. Patient however denied shortness of breath or chest pain. BNP elevated in the 600s. Chest x-ray with no significant abnormalities. Continue current regimen of aspirin and Plavix. Cardizem CD, Imdur, Toprol-XL on hold secondary to borderline blood pressure.  Continue statin. Discontinued IV Lasix with worsening renal function and hyponatremia. Patient was to get a spinal injection in the outpatient setting Plavix will likely need to be discontinued at that time however will defer to pain management doctor at this time.  #8 COPD/polymyalgia rheumatica Stable. Continue home regimen of prednisone 10 mg daily. Patient on chronic prednisone and increased risk for avascular necrosis, pathologic fracture, osteoporosis. D/c Motrin due to worsening renal function.   #9 MGUS Outpatient follow-up with hematology/oncology.  #10 hypertension Blood pressure in the 130s today from 90s yesterday 03/22/2017. Continue to hold antihypertensive medications. Gentle hydration. Follow.   #11 constipation Continue current regimen of MiraLAX twice daily, Senokot S twice daily. Patient with abdominal distention and probable urinary retention 03/20/2017 which improved after smog enema was given with good results.  #12 urinary retention Patient was noted to have urinary retention 03/20/2017 and foley catheter placed. Patient started on Flomax. May consider voiding trial in the next 3-4 days.  #13 dysphagia During last admission speech therapy evaluated the patient and recommended dysphagia 3 diet. It is noted that patient's daughter states patient on a regular diet at home. Patient was tolerating diet of pured food which per nursing daughter orders prior to patient's lethargy. Patient more alert today has been assessed by the speech therapist and placed on a dysphagia 1 diet with thin liquids. SLP ff.  #14  hyponatremia ?? Acute on Chronic hyponatremia. ?? SIADH versus hypovolemia. Patient with a worsening hyponatremia initially felt may have been secondary to hypervolemic hyponatremia as hyponatremia improved with some diuresis initially. Urine osmolality was 168 and low, urine sodium was 29, serum osmolality was decreased at 272. Sodium levels worsened after gentle  hydration. IV fluids were discontinued. TSH within normal limits at 0.898.  Patient given Lasix 40 mg IV 1 03/17/2017 with some improvement with hyponatremia. Patient given Lasix 20 mg IV 1 03/18/2017 with a slight decrease in sodium levels. Patient weight of 64.4 kg(03/22/2017) from 70 kg 03/21/2017 from 69.4 kg 03/20/2017 from 68 kg on 03/18/2017 from 63 kg on 03/14/2017. Patient given Lasix 60 mg IV the morning of 03/21/2017 and then 80 mg IV the evening of 03/21/2017. Urine output over the past 24 hours is not properly recorded. Sodium level now at 123 on 03/22/2017 with no significant improvement. Urine sodium was less than 10. Urine osmolality 247, urine creatinine 60.25. Serum osmolality of 273. Uric acid level was 8. Patient placed on IV fluids per nephrology with improvement in hyponatremia sodium level currently at 128 from 123 on 03/22/2017. Patient alert sitting up in chair following commands. Neurontin was discontinued.  Appreciate nephrology input and recommendations.   #15 chest pain Chest wall tender to palpation. Chest pain is reproducible. Likely musculoskeletal in nature. Place on scheduled Tylenol.   DVT prophylaxis: Lovenox Code Status: Full Family Communication: Updated daughter at bedside. Disposition Plan: Likely skilled nursing facility when medically stable.    Consultants:   Infectious disease: Dr.Comer 03/15/2017  Nephrology: Dr. Joelyn Oms 03/22/2017  Procedures:   CT right hip 03/15/2017  Chest x-ray 03/14/2017, 03/20/2017  Abdominal ultrasound 03/15/2017  Plain films of the right hip and pelvis 03/14/2017  CT head 03/21/2017  CT chest 03/21/2017  Antimicrobials:   IV vancomycin 03/14/2017>>>>> 03/15/2017  IV Zosyn 03/14/2017>>>> 03/15/2017  IV vancomycin 04/01/2017>>>>> 03/23/2017  IV cefepime 03/22/2017   Subjective: Patient alerts. Patient sitting up in chair and eating her lunch. Patient complaining of chest wall tenderness. Daughter  requesting formal consultation for evaluation of patient's right hip pain by [facility],Dr Aluisio, [facility], Dr. Mayer Camel  Objective: Vitals:   03/22/17 2124 03/23/17 0653 03/23/17 0757 03/23/17 0845  BP: (!) 112/41 (!) 134/59    Pulse: 74 79    Resp: 12 12    Temp: (!) 97.4 F (36.3 C) 98.1 F (36.7 C)  98.5 F (36.9 C)  TempSrc: Oral Oral    SpO2: 97% 99% 98%   Weight:      Height:        Intake/Output Summary (Last 24 hours) at 03/23/17 1539 Last data filed at 03/23/17 1149  Gross per 24 hour  Intake              250 ml  Output              550 ml  Net             -300 ml   Filed Weights   03/20/17 0442 03/21/17 0444 03/22/17 0930  Weight: 69.4 kg (153 lb) 70 kg (154 lb 5.2 oz) 64.4 kg (142 lb)    Examination:  General exam: Sleeping. Respiratory system: less rhonchorous breath sounds. Respiratory effort normal. Cardiovascular system: Regular rate and rhythm. No murmurs rubs or gallops. No lower extremity edema. Chest wall tender to palpation. Gastrointestinal system: Abdomen is soft, nontender, mildly distended, positive bowel sounds.  Central nervous system: Awake, alert and oriented. No focal neurological deficits. Extremities: Symmetric 5 x 5 power. Skin: Rash on back Psychiatry: Judgement and insight appear fair. Mood & affect appropriate.     Data Reviewed: I have personally reviewed following labs and imaging studies  CBC:  Recent Labs Lab 03/20/17 1330 03/21/17 0407 03/22/17 0428 03/23/17 0406  WBC 3.5* 17.2* 19.8* 14.2*  NEUTROABS 1.6* 13.4* 17.7*  12.2*  HGB 11.1* 9.5* 9.3* 9.2*  HCT 32.2* 27.3* 27.3* 26.7*  MCV 90.4 88.9 89.8 87.8  PLT 118* 221 210 119   Basic Metabolic Panel:  Recent Labs Lab 03/21/17 0407 03/21/17 1352 03/22/17 0428 03/22/17 1534 03/23/17 0406  NA 124* 124* 123* 125* 128*  K 3.9 3.9 4.6 4.2 4.0  CL 82* 83* 84* 86* 90*  CO2 30 27 26 25 25   GLUCOSE 101* 137* 118* 127* 127*  BUN 36* 38* 46*  54* 59*  CREATININE 1.49* 1.60* 2.38* 2.68* 2.18*  CALCIUM 8.8* 8.8* 8.2* 8.2* 8.0*   GFR: Estimated Creatinine Clearance: 19.2 mL/min (A) (by C-G formula based on SCr of 2.18 mg/dL (H)). Liver Function Tests: No results for input(s): AST, ALT, ALKPHOS, BILITOT, PROT, ALBUMIN in the last 168 hours. No results for input(s): LIPASE, AMYLASE in the last 168 hours.  Recent Labs Lab 03/21/17 1352  AMMONIA 12   Coagulation Profile: No results for input(s): INR, PROTIME in the last 168 hours. Cardiac Enzymes: No results for input(s): CKTOTAL, [name], [name], TROPONINI in the last 168 hours. BNP (last 3 results) No results for input(s): PROBNP in the last 8760 hours. HbA1C: No results for input(s): HGBA1C in the last 72 hours. CBG:  Recent Labs Lab 03/22/17 1235 03/22/17 1700 03/22/17 2123 03/23/17 0754 03/23/17 1211  GLUCAP 124* 140* 112* 96 293*   Lipid Profile: No results for input(s): CHOL, [name], [name], [name], [name], LDLDIRECT in the last 72 hours. Thyroid Function Tests: No results for input(s): TSH, T4TOTAL, FREET4, T3FREE, THYROIDAB in the last 72 hours. Anemia Panel: No results for input(s): VITAMINB12, FOLATE, FERRITIN, TIBC, IRON, RETICCTPCT in the last 72 hours. Sepsis Labs: No results for input(s): PROCALCITON, LATICACIDVEN in the last 168 hours.  Recent Results (from the past 240 hour(s))  Blood culture (routine x 2)     Status: None   Collection Time: 03/14/17  1:45 PM  Result Value Ref Range Status   Specimen Description BLOOD LEFT ANTECUBITAL  Final   Special Requests   Final    BOTTLES DRAWN AEROBIC AND ANAEROBIC Blood Culture adequate volume   Culture   Final    NO GROWTH 5 DAYS Performed at [facility], [address], [place], [place] [address]    Report Status 03/19/2017 FINAL  Final  Blood culture (routine x 2)     Status: None   Collection Time: 03/14/17  1:48 PM  Result Value Ref Range Status   Specimen Description BLOOD RIGHT  ANTECUBITAL  Final   Special Requests   Final    BOTTLES DRAWN AEROBIC AND ANAEROBIC Blood Culture adequate volume   Culture   Final    NO GROWTH 5 DAYS Performed at [facility], [place] [address] [place], [place] [address]    Report Status 03/19/2017 FINAL  Final  Urine culture     Status: Abnormal   Collection Time: 03/14/17  2:44 PM  Result Value Ref Range Status   Specimen Description URINE, CATHETERIZED  Final   Special Requests NONE  Final   Culture >=100,000 COLONIES/mL PSEUDOMONAS AERUGINOSA (A)  Final   Report Status 03/16/2017 FINAL  Final   Organism ID, Bacteria PSEUDOMONAS AERUGINOSA (A)  Final      Susceptibility   Pseudomonas aeruginosa - MIC*    CEFTAZIDIME 4 SENSITIVE Sensitive     CIPROFLOXACIN <=0.25 SENSITIVE Sensitive     GENTAMICIN <=1 SENSITIVE Sensitive     IMIPENEM 2 SENSITIVE Sensitive  PIP/TAZO 8 SENSITIVE Sensitive     CEFEPIME 2 SENSITIVE Sensitive     * >=100,000 COLONIES/mL PSEUDOMONAS AERUGINOSA  Culture, Urine     Status: None   Collection Time: 03/20/17 12:23 PM  Result Value Ref Range Status   Specimen Description URINE, CATHETERIZED  Final   Special Requests NONE  Final   Culture   Final    NO GROWTH Performed at [facility], [place] [address], [place], [place] [address]    Report Status 03/22/2017 FINAL  Final  Culture, blood (routine x 2) Call MD if unable to obtain prior to antibiotics being given     Status: None (Preliminary result)   Collection Time: 03/22/17  9:20 AM  Result Value Ref Range Status   Specimen Description BLOOD RIGHT ARM  Final   Special Requests IN PEDIATRIC BOTTLE Blood Culture adequate volume  Final   Culture   Final    NO GROWTH 1 DAY Performed at [facility], [place] [address], [place], [place] [address]    Report Status PENDING  Incomplete  Culture, blood (routine x 2) Call MD if unable to obtain prior to antibiotics being given     Status: None (Preliminary result)   Collection Time: 03/22/17   9:20 AM  Result Value Ref Range Status   Specimen Description BLOOD LEFT ARM  Final   Special Requests IN PEDIATRIC BOTTLE Blood Culture adequate volume  Final   Culture   Final    NO GROWTH 1 DAY Performed at [facility], [place] [address] [place], [place] [address]    Report Status PENDING  Incomplete  MRSA PCR Screening     Status: None   Collection Time: 03/23/17 10:10 AM  Result Value Ref Range Status   MRSA by PCR NEGATIVE NEGATIVE Final    Comment:        The GeneXpert MRSA Assay (FDA approved for NASAL specimens only), is one component of a comprehensive MRSA colonization surveillance program. It is not intended to diagnose MRSA infection nor to guide or monitor treatment for MRSA infections.          Radiology Studies: Ct Head Wo Contrast  Result Date: 03/21/2017 CLINICAL DATA:  76 year old female with increasing drowsiness and lethargy. EXAM: CT HEAD WITHOUT CONTRAST TECHNIQUE: Contiguous axial images were obtained from the base of the skull through the vertex without intravenous contrast. COMPARISON:  Head CT 01/25/2017. FINDINGS: Brain: Patchy and confluent areas of decreased attenuation are noted throughout the deep and periventricular white matter of the cerebral hemispheres bilaterally, compatible with chronic microvascular ischemic disease. Well-defined areas of low attenuation lesions in the basal ganglia bilaterally, compatible with old lacunar infarcts. No evidence of acute infarction, hemorrhage, hydrocephalus, extra-axial collection or mass lesion/mass effect. Vascular: No hyperdense vessel or unexpected calcification. Skull: Normal. Negative for fracture or focal lesion. Sinuses/Orbits: Small left mastoid effusion is unchanged. No acute finding. Other: None. IMPRESSION: 1. No acute intracranial abnormalities. 2. 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 3. Small left mastoid effusion is unchanged. Electronically Signed   By: Vinnie Langton M.D.   On: 03/21/2017 15:51   Ct Chest Wo Contrast  Result Date: 03/21/2017 CLINICAL DATA:  COPD.  Possible pneumonia. EXAM: CT CHEST WITHOUT CONTRAST TECHNIQUE: Multidetector CT imaging of the chest was performed following the standard protocol without IV contrast. COMPARISON:  Chest radiograph 03/20/2017 FINDINGS: Cardiovascular:  Normal heart size. No pericardial effusion. Ectasia and calcific atherosclerotic disease of the thoracic aorta. Irregularity of the contour of the aorta at the thoracic arch, with maximum diameter of 2.7 cm. Crescent-shaped calcifications along the lateral border the proximal abdominal aorta, image 137/152, sequence 3. Mediastinum/Nodes: No enlarged mediastinal or axillary lymph nodes. Thyroid gland, and trachea demonstrate no significant findings. Debris within the dependent portion of the esophagus. Lungs/Pleura: Patchy areas of airspace consolidation in the right greater than left lower lobe in dependent distribution. Smaller areas of airspace consolidation extend into the bilateral upper lobes. Upper Abdomen: No acute abnormality. Musculoskeletal: Apparent interruption of the right proximal humeral cortex may be artifactual. Multilevel osteoarthritic changes of the thoracic spine. IMPRESSION: Ectasia and calcific atherosclerotic disease of the visualized aorta. Irregularity of the aortic contour at the thoracic arch, and intraluminal calcifications at the thoracic arch and at the level of the proximal abdominal aorta may represent rim calcified atherosclerotic plaque versus dissection of the aorta, incompletely evaluated due to lack of IV contrast. Bilateral lower lobe predominant pneumonia with dependent distribution. Query aspiration pneumonia, especially given debris within the esophagus. Apparent interruption of the cortex of the right proximal humerus, which may be due  to motion artifact rather than representing a true fracture. Please correlate to possible point of tenderness. These results will be called to the ordering clinician or representative by the Radiologist Assistant, and communication documented in the PACS or zVision Dashboard. Electronically Signed   By: Fidela Salisbury M.D.   On: 03/21/2017 19:07        Scheduled Meds: . clopidogrel  75 mg Oral Q breakfast  . cloZAPine  300 mg Oral QHS  . enoxaparin (LOVENOX) injection  30 mg Subcutaneous Q24H  . fluticasone  2 spray Each Nare Daily  . guaiFENesin  600 mg Oral BID  . insulin aspart  0-5 Units Subcutaneous QHS  . insulin aspart  0-9 Units Subcutaneous TID WC  . ipratropium-albuterol  3 mL Inhalation TID  . levothyroxine  75 mcg Oral QAC breakfast  . loratadine  10 mg Oral Daily  . mometasone-formoterol  2 puff Inhalation BID  . pantoprazole (PROTONIX) IV  40 mg Intravenous Q24H  . polyethylene glycol  17 g Oral BID  . pravastatin  20 mg Oral QHS  . predniSONE  10 mg Oral Q breakfast  . protein supplement shake  11 oz Oral BID BM  . senna-docusate  1 tablet Oral BID  . sodium chloride HYPERTONIC  4 mL Nebulization Daily  . tamsulosin  0.4 mg Oral Daily   Continuous Infusions: . sodium chloride 75 mL/hr at 03/23/17 0544  . ceFEPime (MAXIPIME) IV Stopped (03/23/17 0912)  . vancomycin       LOS: 7 days    Time spent: 32 minutes    Mariona Scholes, MD [facility] Pager [phone]  If 7PM-7AM, please contact night-coverage [email or web address] Password [email or web address] 03/23/2017, 3:39 PM

## 2017-03-23 NOTE — Progress Notes (Signed)
Holding CPT. Patient in pain.

## 2017-03-23 NOTE — Progress Notes (Signed)
Patients daughter reports to nurse that patient has been having intermittent chest pain in the center of her chest and left chest  This morning. Patient is currently sleeping and daughter reports that she is not having the pain right now. Dr. [place] Silos paged and made aware. EKG ordered at this time. Will continue to monitor patient.

## 2017-03-23 NOTE — Progress Notes (Signed)
Occupational Therapy Treatment Patient Details Name: Rebecca [name] MRN: [id number] DOB: [birth year] Today's Date: 03/23/2017    History of present illness PMH of COPD, chronic prednisone therapy, CAD, CKD stage IV, diastolic heart failure, MGUS, hypothyroidism, CVA, polymyalgia rheumatica; admitted on 03/14/2017, presented with complaint of right hip pain (likely combination from L5-S1 spondylosis, seen on MRI of lumbar spine in 2015 and muscular pain in gluteal region)   OT comments  cotx with PT.  Yesterday pt needed total A; much better today.  Pt self-limited tx; agreeable to SPT but not walking.  Cues for safety needed  Follow Up Recommendations  SNF    Equipment Recommendations  3 in 1 bedside commode    Recommendations for Other Services      Precautions / Restrictions Precautions Precautions: Fall Restrictions Weight Bearing Restrictions: No       Mobility Bed Mobility               General bed mobility comments: pt OOB in recliner  Transfers Overall transfer level: Needs assistance Equipment used: None Transfers: Sit to/from Stand;Stand Pivot Transfers Sit to Stand: Min assist;Mod assist;+2 safety/equipment Stand pivot transfers: Min assist;Mod assist;+2 safety/equipment       General transfer comment: 50% VC's on proper hand placement and 75% VC's for turn completion prior to sit     Balance                                           ADL either performed or assessed with clinical judgement   ADL                   Upper Body Dressing : Minimal assistance;Sitting (unsnapped around IV; assist to resnap clean gown)       Toilet Transfer: Minimal assistance;Moderate assistance;Stand-pivot;BSC;RW             General ADL Comments: transferred to 3:1 commode and back to chair. Pt self-limited activities. She was not wiling to try to walk a few steps to 3:1 commode.  Cues for safety as pt tried to step over to chair as quickly  as possible     Vision       Perception     Praxis      Cognition Arousal/Alertness: Awake/alert Behavior During Therapy: WFL for tasks assessed/performed Overall Cognitive Status: Within Functional Limits for tasks assessed                                 General Comments: awake and following all commands         Exercises     Shoulder Instructions       General Comments      Pertinent Vitals/ Pain       Pain Assessment: Faces Faces Pain Scale: Hurts little more Pain Location: buttocks while on BSC Pain Descriptors / Indicators: Sore;Discomfort;Grimacing Pain Intervention(s): Limited activity within patient's tolerance;Monitored during session;Repositioned;Premedicated before session  Home Living                                          Prior Functioning/Environment              Frequency  Progress Toward Goals  OT Goals(current goals can now be found in the care plan section)  Progress towards OT goals: Progressing toward goals     Plan      Co-evaluation    PT/OT/SLP Co-Evaluation/Treatment: Yes Reason for Co-Treatment: For patient/therapist safety PT goals addressed during session: Mobility/safety with mobility OT goals addressed during session: ADL's and self-care      AM-PAC PT "6 Clicks" Daily Activity     Outcome Measure   Help from another person eating meals?: None Help from another person taking care of personal grooming?: A Little Help from another person toileting, which includes using toliet, bedpan, or urinal?: A Lot Help from another person bathing (including washing, rinsing, drying)?: A Lot Help from another person to put on and taking off regular upper body clothing?: A Little Help from another person to put on and taking off regular lower body clothing?: A Lot 6 Click Score: 16    End of Session    OT Visit Diagnosis: Pain;Muscle weakness (generalized) (M62.81) Pain -  Right/Left: Right Pain - part of body: Hip   Activity Tolerance Patient tolerated treatment well   Patient Left in chair;with call bell/phone within reach;with chair alarm set;with family/visitor present   Nurse Communication          Time: 7414-2395 OT Time Calculation (min): 25 min  Charges: OT General Charges $OT Visit: 1 Visit OT Treatments $Self Care/Home Management : 8-22 mins  Lesle Chris, OTR/L [phone] 03/23/2017   Rebecca [name] 03/23/2017, 3:51 PM

## 2017-03-23 NOTE — Progress Notes (Signed)
Pt having a hard time with flutter. She sucks in instead of blowing out. She is also to sleepy at times.

## 2017-03-23 NOTE — Evaluation (Signed)
Clinical/Bedside Swallow Evaluation Patient Details  Name: Rebecca [name] MRN: [id number] Date of Birth: Aug 16, 1940  Today's Date: 03/23/2017 Time: SLP Start Time (ACUTE ONLY): 0911 SLP Stop Time (ACUTE ONLY): 1005 SLP Time Calculation (min) (ACUTE ONLY): 54 min  Past Medical History:  Past Medical History:  Diagnosis Date  . AKI (acute kidney injury) ([place])   . Anemia in chronic renal disease   . Aortic atherosclerosis ([place]) 01/27/2017  . CAD (coronary artery disease) 2006   a. s/p DES to RCA in 2006 b. low-risk NST in 2014 c. cath in 08/2015 showing patent RCA stent with nonobstructive disease  . CKD (chronic kidney disease) stage 4, GFR 15-29 ml/min ([facility])   . Complication of anesthesia   . COPD (chronic obstructive pulmonary disease) ([place])   . Diabetes mellitus   . Diastolic heart failure, NYHA class 1 ([place])   . Dyslipidemia   . Dysuria 01/06/2015  . Frozen shoulder    right  . Gastroesophageal reflux   . HAP (hospital-acquired pneumonia)   . Headache   . Hyperkalemia 01/01/2016  . Hypothyroidism   . 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 Obesity   . Pancreatic mass 01/27/2017  . Pneumonia 06/2016  . Polymyalgia rheumatica ([place])   . PONV (postoperative nausea and vomiting)   . Renal insufficiency   . Respiratory distress   . Schizoaffective disorder ([place])   . Sepsis due to pneumonia ([place])   . Shortness of breath   . Stroke [facility])    Past Surgical History:  Past Surgical History:  Procedure Laterality Date  . ABDOMINAL HYSTERECTOMY    . CARDIAC CATHETERIZATION N/A 08/13/2015   Procedure: Left Heart Cath and Coronary Angiography;  Surgeon: Jettie Booze, MD;  Location: [facility];  Service: Cardiovascular;  Laterality: N/A;  . CATARACT EXTRACTION    . CHOLECYSTECTOMY    . CORONARY ANGIOPLASTY WITH STENT PLACEMENT  2006   [place] DES to RCA, 50 % residual  . PARTIAL HYSTERECTOMY  1995   HPI:  76 yo female adm to [facility] with acute right  hip pain.  PMH + for COPD, schizophrenia, CAD, MGUS, CVA, PMR, dysphagia, pna.  Pt found to have debris in her esophagus on CT chest and concerns present for aspiration pna.  Pt with known esophageal dysmotilty - no HH nor reflux per esophagram 01/2017 during hospital admission.  Daughter [name] present and very involved in patient care plan- stating she purees pt's foods at home.  Swallow evaluation reordered.   Assessment / Plan / Recommendation Clinical Impression  Pt has h/o multilfactorial dysphagia including mild oropharyngeal deficits per [facility] April 2018 and esophageal dysmotility diagnosed August 2018 via esophagram.  Pt resistant to full HOB elevation due to hip discomfort - educated pt to importance of sitting upright as much as able.  Daughter reports pt has been receiving puree/thin diet at home.    Suspect pt's primary aspiration risk is due to known esophageal deficits.  Pt noted to be impulsive and take large sequential boluses.  Provided information re: provale bolus flow cup and safe straw that may help to decrease aspiration risk (verbally and in writing) and foster independence and hydration.  Pt today observed with water and Ensure - she tolerates much better with small single boluses.  With large sequential boluses, pt does demonstrate coughing - but also baseline cough due to her pneumonia.  SLP can not rule out aspiration completely but mitigation strategies already in place and informed pt/daughter  of chronic risk and risk factors.    Question it pt/family would benefit from palliative referral to help establish goals given repeated hospital admissions and chronic deficits.  Will follow up briefly - Recommend follow up SLP at SNF on trial basis to help generalize compensation strategies.   SLP Visit Diagnosis: Dysphagia, unspecified (R13.10);Dysphagia, oropharyngeal phase (R13.12)    Aspiration Risk  Moderate aspiration risk;Risk for inadequate nutrition/hydration    Diet  Recommendation Dysphagia 1 (Puree);Thin liquid   Liquid Administration via: Cup;Straw Medication Administration: Crushed with puree Supervision: Full supervision/cueing for compensatory strategies Compensations: Slow rate;Small sips/bites;Other (Comment) (consume liquids t/o meals, small frequent meals) Postural Changes: Seated upright at 90 degrees    Other  Recommendations Oral Care Recommendations: Oral care BID   Follow up Recommendations Skilled Nursing facility      Frequency and Duration min 2x/week  2 weeks       Prognosis Prognosis for Safe Diet Advancement: Fair Barriers to Reach Goals: Time post onset      Swallow Study   General Date of Onset: 03/23/17 HPI: 76 yo female adm to [facility] with acute right hip pain.  PMH + for COPD, schizophrenia, CAD, MGUS, CVA, PMR, dysphagia, pna.  Pt found to have debris in her esophagus on CT chest and concerns present for aspiration pna.  Pt with known esophageal dysmotilty - no HH nor reflux per esophagram 01/2017 during hospital admission.  Daughter [name] present and very involved in patient care plan- stating she purees pt's foods at home.  Swallow evaluation reordered. Type of Study: Bedside Swallow Evaluation Diet Prior to this Study: Dysphagia 1 (puree);Thin liquids Temperature Spikes Noted: No Respiratory Status: Room air History of Recent Intubation: No Behavior/Cognition: Alert;Impulsive;Distractible Oral Care Completed by SLP: Yes (pt swished and expectorated x3 during procedure) Oral Cavity - Dentition: Edentulous (dentures in room but not in place, daughter reports she places them during the day) Vision: Functional for self-feeding Self-Feeding Abilities: Able to feed self Patient Positioning: Partially reclined (pt resistant to [facility] elevation due to her hip pain, recognize "happy medium" needs to be found) Baseline Vocal Quality: Low vocal intensity;Hoarse Volitional Cough: Strong Volitional Swallow: Able to elicit     Oral/Motor/Sensory Function Overall Oral Motor/Sensory Function: Within functional limits   Ice Chips Ice chips: Not tested   Thin Liquid Thin Liquid: Impaired Pharyngeal  Phase Impairments: Cough - Delayed;Cough - Immediate Other Comments: cough noted with and without intake- pt impulsive and takes large boluses, small single boluses tolerated better    Nectar Thick Nectar Thick Liquid: Impaired Presentation: Self Fed;Straw Pharyngeal Phase Impairments: Throat Clearing - Immediate Other Comments: pt smaller boluses provides improved airway protection   Honey Thick Honey Thick Liquid: Not tested   Puree Puree: Not tested   Solid   GO   Solid: Not tested        Macario Golds 03/23/2017,10:36 AM  Luanna Salk, [facility] SLP [phone]

## 2017-03-23 NOTE — Progress Notes (Signed)
Admit: 03/14/2017 LOS: 7  75F with significant comorbidities with AoCKD4 and progressive mild hyponatremia (likely low solute / hypovolemia)  Subjective:  SNa and SCr improved in past 24h with hydration  Remarkably improved mental status today, awake, conversant, woring with PT/OT, eating, cheerful   Results for Grave, Rebecca [name] (MRN [id number]) as of 03/23/2017 14:32  Ref. Range 03/22/2017 16:37  Osmolality, Urine Latest Ref Range: 300 - 900 mOsm/kg 247 (L)  Sodium, Urine Latest Units: mmol/L <10   10/09 0701 - 10/10 0700 In: 0  Out: 700 [Urine:700]  Filed Weights   03/20/17 0442 03/21/17 0444 03/22/17 0930  Weight: 69.4 kg (153 lb) 70 kg (154 lb 5.2 oz) 64.4 kg (142 lb)    Scheduled Meds: . clopidogrel  75 mg Oral Q breakfast  . cloZAPine  300 mg Oral QHS  . enoxaparin (LOVENOX) injection  30 mg Subcutaneous Q24H  . fluticasone  2 spray Each Nare Daily  . guaiFENesin  600 mg Oral BID  . insulin aspart  0-5 Units Subcutaneous QHS  . insulin aspart  0-9 Units Subcutaneous TID WC  . ipratropium-albuterol  3 mL Inhalation TID  . levothyroxine  75 mcg Oral QAC breakfast  . loratadine  10 mg Oral Daily  . mometasone-formoterol  2 puff Inhalation BID  . pantoprazole (PROTONIX) IV  40 mg Intravenous Q24H  . polyethylene glycol  17 g Oral BID  . pravastatin  20 mg Oral QHS  . predniSONE  10 mg Oral Q breakfast  . protein supplement shake  11 oz Oral BID BM  . senna-docusate  1 tablet Oral BID  . sodium chloride HYPERTONIC  4 mL Nebulization Daily  . tamsulosin  0.4 mg Oral Daily   Continuous Infusions: . sodium chloride 75 mL/hr at 03/23/17 0544  . ceFEPime (MAXIPIME) IV Stopped (03/23/17 0912)  . vancomycin     PRN Meds:.acetaminophen, aspirin EC, HYDROmorphone, ipratropium-albuterol, ondansetron **OR** ondansetron (ZOFRAN) IV, temazepam  Current Labs: reviewed    Physical Exam:  Blood pressure (!) 134/59, pulse 79, temperature 98.5 F (36.9 C), resp. rate 12, height 5\' 3"   (1.6 m), weight 64.4 kg (142 lb), SpO2 98 %. GEN: somnolent ENT: NCAT CV: 2/6 MSM, regular PULM: diminished at bases ABD: s/nt/nd SKIN: no rashes/lesions EXT:No LEE  A 1. AoCKD, suspected related to recent diruesis + poor PO; diuretics held and SCr improved with gentle hydration. Sees [place] CKA, SCr labile in 1s at baseline.  2. Hyponatremia, mildly hypotonic; improved with gentle hydration and holding diuretics; likely combination of low solute intake and hypovolemic hyponatremia.  NOw eating should do better.  Do not restart diuretics for now but likley will be needed down the road.   3. HCAP on cefepime and vanc based on 10/8 CT Chest;  4. AMS / Lethargy improved 5. Schizoaffective disorder, on clozaril 6. COPD 7. MGUS  P 1. Doing remarkably better; renal issues and hyponatremia quickly improved. Cont to push solute (protein esp) and gentley fluid restrict around 1.5L day.   2. Hold IVFs 3. Will s/o for now; can f/u with Dr. Moshe Cipro as already scheduled.    Pearson Grippe MD 03/23/2017, 2:30 PM   Recent Labs Lab 03/22/17 0428 03/22/17 1534 03/23/17 0406  NA 123* 125* 128*  K 4.6 4.2 4.0  CL 84* 86* 90*  CO2 26 25 25   GLUCOSE 118* 127* 127*  BUN 46* 54* 59*  CREATININE 2.38* 2.68* 2.18*  CALCIUM 8.2* 8.2* 8.0*    Recent Labs Lab 03/21/17  0407 03/22/17 0428 03/23/17 0406  WBC 17.2* 19.8* 14.2*  NEUTROABS 13.4* 17.7* 12.2*  HGB 9.5* 9.3* 9.2*  HCT 27.3* 27.3* 26.7*  MCV 88.9 89.8 87.8  PLT 221 210 193

## 2017-03-23 NOTE — Progress Notes (Signed)
Physical Therapy Treatment Patient Details Name: NERINE PULSE MRN: [id number] DOB: November 22, 1940 Today's Date: 03/23/2017    History of Present Illness PMH of COPD, chronic prednisone therapy, CAD, CKD stage IV, diastolic heart failure, MGUS, hypothyroidism, CVA, polymyalgia rheumatica; admitted on 03/14/2017, presented with complaint of right hip pain (likely combination from L5-S1 spondylosis, seen on MRI of lumbar spine in 2015 and muscular pain in gluteal region)    PT Comments    Pt awake and in recliner.  Alert.  Following all commands.  Co Tx with OT  Assisted out of recliner to [facility] then attempted amb however pt resistant/insistant to return to chair.  Performed some B LE TE's then positioned to comfort.   Follow Up Recommendations  SNF     Equipment Recommendations  None recommended by PT    Recommendations for Other Services       Precautions / Restrictions Precautions Precautions: Fall Restrictions Weight Bearing Restrictions: No    Mobility  Bed Mobility               General bed mobility comments: pt OOB in recliner  Transfers Overall transfer level: Needs assistance Equipment used: None Transfers: Sit to/from Stand;Stand Pivot Transfers Sit to Stand: Min assist;Mod assist;+2 safety/equipment Stand pivot transfers: Min assist;Mod assist;+2 safety/equipment       General transfer comment: 50% VC's on proper hand placement and 75% VC's for turn completion prior to sit   Ambulation/Gait             General Gait Details: pt declined taking a few side steps back to recliner fully weight bearing on R LE with no signs of pain   Stairs            Wheelchair Mobility    Modified Rankin (Stroke Patients Only)       Balance                                            Cognition Arousal/Alertness: Awake/alert Behavior During Therapy: WFL for tasks assessed/performed Overall Cognitive Status: Within Functional Limits for  tasks assessed                                 General Comments: awake and following all commands     Sitting TE's  Exercises  B LE LAQ's 10 reps  B LE marching 10 reps    General Comments        Pertinent Vitals/Pain Pain Assessment: Faces Faces Pain Scale: Hurts little more Pain Location: buttocks while on BSC Pain Descriptors / Indicators: Sore;Discomfort;Grimacing Pain Intervention(s): Monitored during session;Repositioned    Home Living                      Prior Function            PT Goals (current goals can now be found in the care plan section) Progress towards PT goals: Progressing toward goals    Frequency    Min 3X/week      PT Plan Current plan remains appropriate    Co-evaluation              AM-PAC PT "6 Clicks" Daily Activity  Outcome Measure  Difficulty turning over in bed (including adjusting bedclothes, sheets and blankets)?: A Lot Difficulty moving from lying  on back to sitting on the side of the bed? : A Lot Difficulty sitting down on and standing up from a chair with arms (e.g., wheelchair, bedside commode, etc,.)?: A Lot Help needed moving to and from a bed to chair (including a wheelchair)?: A Lot Help needed walking in hospital room?: A Lot Help needed climbing 3-5 steps with a railing? : A Lot 6 Click Score: 12    End of Session Equipment Utilized During Treatment: Gait belt Activity Tolerance: Patient tolerated treatment well;Other (comment) (self limited/declined ambulation) Patient left: in chair;with family/visitor present;with call bell/phone within reach Nurse Communication: Mobility status PT Visit Diagnosis: Difficulty in walking, not elsewhere classified (R26.2);Pain     Time: 3762-8315 PT Time Calculation (min) (ACUTE ONLY): 23 min  Charges:  $Gait Training: 8-22 mins $Therapeutic Activity: 8-22 mins                    G Codes:      Rica Koyanagi  PTA [facility]  Acute  Rehab Pager       [phone]

## 2017-03-23 NOTE — Progress Notes (Signed)
Pharmacy Antibiotic Note  Rebecca [name] is a 76 y.o. female admitted on 03/14/2017 with c/o hip pain, CXray with infiltrate vs atalectasis at L base. Vancomycin and Zosyn started empirically for possible HCAP 10/1-10/2. Previous Urine cx with Pseudomonas, considered colonized - no treatment per ID MD  10/9:  Pharmacy has been consulted again for Vancomycin/Cefepime dosing for HCAP.   Plan: Renal fx poor, Cl < 20 ml/min Vancomycin 1500mg  x1, follow with 1000mg  q48hr from 2200 tonight De-escalate abx as soon as possible, high risk of renal impairment with Vanc Cefepime 1gm q24  Height: 5\' 3"  (160 cm) Weight: 142 lb (64.4 kg) IBW/kg (Calculated) : 52.4  Temp (24hrs), Avg:98 F (36.7 C), Min:97.4 F (36.3 C), Max:98.5 F (36.9 C)   Recent Labs Lab 03/20/17 1330 03/21/17 0407 03/21/17 1352 03/22/17 0428 03/22/17 1534 03/23/17 0406  WBC 3.5* 17.2*  --  19.8*  --  14.2*  CREATININE  --  1.49* 1.60* 2.38* 2.68* 2.18*  VANCOTROUGH  --   --   --   --   --  22*    Estimated Creatinine Clearance: 19.2 mL/min (A) (by C-G formula based on SCr of 2.18 mg/dL (H)).    Allergies  Allergen Reactions  . Levaquin [Levofloxacin] Other (See Comments)    Ruptured tendon  . Tape Other (See Comments)    PATIENT'S SKIN IS VERY, VERY THIN AND WILL TEAR AND BRUISE EASILY; PLEASE USE AN ALTERNATIVE (SOMETHING OTHER THAN TAPE)   Antimicrobials this admission:  10/1 vancomycin >> 10/2 10/1 Zosyn >> 10/2 10/9 Vancomycin >> 10/9 Cefepime >>  Antibiotic levels and dose adjustments: 10/10 random level 0500 = 22 mcg/ml after 1500mg  Vanc ~ 10am 10/9  Microbiology results:  10/1 BCx: ng-final 10/1 UCx: pseudomonas - colonized-do not treat/ID, UCx 9/24 with pseudomonas 10/7 UCx: cath urine, ng-final          Flu PCR:          Strep pneumo/Legionella  10/9  BCx:          Sputum:  10/9 HIV Ab: pending   Thank you for allowing pharmacy to be a part of this patient's care.  Minda Ditto  PharmD Pager [phone] 03/23/2017, 9:58 AM

## 2017-03-24 ENCOUNTER — Inpatient Hospital Stay (HOSPITAL_COMMUNITY): Payer: Medicare Other

## 2017-03-24 ENCOUNTER — Encounter (HOSPITAL_COMMUNITY): Payer: Self-pay | Admitting: Radiology

## 2017-03-24 DIAGNOSIS — N182 Chronic kidney disease, stage 2 (mild): Secondary | ICD-10-CM

## 2017-03-24 LAB — CBC
HEMATOCRIT: 24.7 % — AB (ref 36.0–46.0)
Hemoglobin: 8.7 g/dL — ABNORMAL LOW (ref 12.0–15.0)
MCH: 31.5 pg (ref 26.0–34.0)
MCHC: 35.2 g/dL (ref 30.0–36.0)
MCV: 89.5 fL (ref 78.0–100.0)
Platelets: 222 10*3/uL (ref 150–400)
RBC: 2.76 MIL/uL — ABNORMAL LOW (ref 3.87–5.11)
RDW: 13.6 % (ref 11.5–15.5)
WBC: 14.7 10*3/uL — ABNORMAL HIGH (ref 4.0–10.5)

## 2017-03-24 LAB — GLUCOSE, CAPILLARY
GLUCOSE-CAPILLARY: 196 mg/dL — AB (ref 65–99)
Glucose-Capillary: 115 mg/dL — ABNORMAL HIGH (ref 65–99)
Glucose-Capillary: 257 mg/dL — ABNORMAL HIGH (ref 65–99)
Glucose-Capillary: 200 mg/dL — ABNORMAL HIGH (ref 65–99)

## 2017-03-24 LAB — BASIC METABOLIC PANEL
Anion gap: 8 (ref 5–15)
BUN: 53 mg/dL — AB (ref 6–20)
CHLORIDE: 97 mmol/L — AB (ref 101–111)
CO2: 26 mmol/L (ref 22–32)
CREATININE: 1.43 mg/dL — AB (ref 0.44–1.00)
Calcium: 8.1 mg/dL — ABNORMAL LOW (ref 8.9–10.3)
GFR calc Af Amer: 40 mL/min — ABNORMAL LOW (ref >60)
GFR calc non Af Amer: 34 mL/min — ABNORMAL LOW (ref >60)
GLUCOSE: 124 mg/dL — AB (ref 65–99)
POTASSIUM: 4.6 mmol/L (ref 3.5–5.1)
Sodium: 131 mmol/L — ABNORMAL LOW (ref 135–145)

## 2017-03-24 MED ORDER — LIDOCAINE HCL 1 % IJ SOLN
INTRAMUSCULAR | Status: AC
  Filled 2017-03-24: qty 20

## 2017-03-24 MED ORDER — METHYLPREDNISOLONE ACETATE 80 MG/ML IJ SUSP
INTRAMUSCULAR | Status: AC
  Filled 2017-03-24: qty 1

## 2017-03-24 MED ORDER — METHYLPREDNISOLONE ACETATE 40 MG/ML IJ SUSP
INTRAMUSCULAR | Status: AC
  Filled 2017-03-24: qty 1

## 2017-03-24 MED ORDER — BUPIVACAINE HCL (PF) 0.25 % IJ SOLN
INTRAMUSCULAR | Status: AC
  Filled 2017-03-24: qty 30

## 2017-03-24 MED ORDER — IOPAMIDOL (ISOVUE-300) INJECTION 61%
INTRAVENOUS | Status: AC
  Filled 2017-03-24: qty 50

## 2017-03-24 MED ORDER — CLOZAPINE 100 MG PO TABS
300.0000 mg | ORAL_TABLET | Freq: Every day | ORAL | Status: DC
  Administered 2017-03-24 – 2017-03-25 (×2): 300 mg via ORAL

## 2017-03-24 NOTE — Progress Notes (Signed)
Patient ID: Rebecca [name], female   DOB: 12-21-1940, 76 y.o.   MRN: [id number] I have reviewed the patient's x-ray of her right hip and CT scan. I also talked with her daughter on the phone. I feel that she would benefit from an intra-articular steroid injection under direct fluoroscopy of that right hip. I spoken to the radiologist about this as well put an order for this today. The patient has had a trochanteric injection for bursitis last month but to me based on the CT scan I believe more the pathology and source of her pain is intra-articular with the right hip. Thus I feel a steroid injection would likely be useful at this point to help temporize her pain and improve her mobility.

## 2017-03-24 NOTE — Progress Notes (Signed)
PROGRESS NOTE    Rebecca [name]  TDD:[id number] DOB: Dec 21, 1940 DOA: 03/14/2017 PCP: [name], MD   Brief Narrative:  Pt. with PMH of COPD, chronic prednisone therapy, CAD status post DES to RCA in 2006, CKD stage IV, diastolic heart failure, MGUS, hypothyroidism ; admitted on 03/14/2017, presented with complaint of right hip pain, was found to have muscular pain. Currently further plan is pain control.   Assessment & Plan:   Principal Problem:   Acute right hip pain Active Problems:   Hypothyroidism   Dyslipidemia   Anemia in chronic renal disease   GASTROESOPHAGEAL REFLUX, NO ESOPHAGITIS   Chronic diastolic (congestive) heart failure (HCC)   Polymyalgia rheumatica (HCC)   MGUS (monoclonal gammopathy of unknown significance)   CKD stage 4, GFR 15-29 ml/min    Essential hypertension   HCAP (healthcare-associated pneumonia)   AKI (acute kidney injury) (HCC)   Pneumonia   Chronic constipation   Chronic pain disorder   Pain of right hip joint   Hypervolemia   Lethargy   Weakness   #1 right hip pain Likely secondary to L5-S1 spondylosis seen on MRI of the L-spine in 2015 with muscular pain in the gluteal region. CT right hip which was done 03/15/2017 with no acute osseous abnormalities, mild degenerative changes of the right hip joint with questionable chondrocalcinosis which could be age-related or reflect CPPD arthropathy. Patient still with significant pain  . Per family patient's oral Dilaudid dose was increased to 4 mg as needed per her hematologist/oncologist just prior to admission.   Patient's  daughter is by the bedside requesting orthopedic evaluation. Requested Dr. Ninfa Linden from [facility] orthopedics to evaluate.  Due to lethargy patient's long-acting MS Contin, IV Dilaudid were discontinued. Oral Dilaudid dose was decreased. Neurontin has been discontinued.  It is noted that patient scheduled to see an interventional pain doctors as an outpatient for her back pain  and may benefit from a spinal/steroid injection. PT/OT recommended SNF.    #2 acute on chronic diastolic heart failure Patient sounded diffusely rhonchorous on lung exam with probable diffuse rales over past few days. Patient with a increase in her weight from admission and as of 03/21/2017 patient's weight was 70 kg from 69.4 kg from 63 kg on admission 03/14/2017. Patient received IV Lasix  . Current weight 64.4 kg (03/22/2017). Chest x-ray with no significant acute abnormalities. BNP elevated. Discontinued IV Lasix. Patient currently either euvolemic or hypovolemic. Strict I's and O's. Daily weights.  #3 lethargy/acute metabolic encephalopathy Patient was lethargic and drowsy on 03/22/2017. Patient with significant improvement alert and open and sitting in the chair. Patient interacting. ?? Etiology. Likely secondary to worsening hyponatremia and HCAP.  Daughter was concerned that her mother likely has a urinary tract infection or some form of infection that is not being treated. Bladder scan did show some urinary retention and a such Foley cath was placed and patient started on Flomax. Patient noted to be constipated and patient given a smog enema with significant results. Abdomen is less tense and softer. BNP was noted to be elevated. Urinalysis with trace leukocytes nitrite -6-30 WBCs. Urine cultures negative. Chest x-ray with no significant acute abnormalities. Discontinued IV Lasix secondary to worsening hyponatremia. CT head negative for any acute abnormalities. Ammonia levels within normal limits. CT chest concerning for bibasilar pneumonia. ABG with no significant changes.  Discontinued long-acting MS Contin. Decreased breakthrough oral Dilaudid to 1 mg every 4 hours as needed. IV Dilaudid has been discontinued. Strict I's and O's. Daily  weights.  #4 healthcare associated pneumonia- Chest x-ray obtained on admission had some concerns for possible pneumonia. Patient was seen in consultation by ID  patient clinically does not have a pneumonia. Blood cultures obtained with no growth to date. IV antibiotics were discontinued by ID recommendations. Patient remained afebrile. Patient with a chronic leukocytosis. Patient noted to be lethargic and as such CT chest was obtained without contrast which was concerning for bilateral lower lobe predominant pneumonia with dependent distribution concern for aspiration pneumonia especially given debris within the esophagus. Sputum Gram stain and culture pending. Urine Legionella antigen negative. Urine pneumococcus antigen negative. MRSA PCR negative. Discontinue IV vancomycin. Continue IV cefepime.  Would DC antibiotics after another 3 days.  #5 repeated UTIs/colonization Patient has had multiple rounds of antibiotics for UTIs last urine culture 03/04/2017 showing pseudomonas aeruginosa. Patient with a chronically elevated leukocytosis on chronic prednisone therapy. Urine cultures positive for Pseudomonas. Patient has been seen in consultation by ID who feel that patient likely has colonization rather than a true acute infection. IV antibiotics have been discontinued. Monitor.  #6 concerning for ascites--ruled out It was noted that patient was supposed to get a paracentesis as outpatient ordered per patient's oncologist. Patient daughter had insisted that ultrasound and paracentesis be done during this hospitalization and as such abdominal ultrasound was ordered which was negative for ascites. Outpatient follow-up.  #7 coronary artery disease Patient asymptomatic. Patient with rhonchorous breath sounds and likely diffuse rales on examination. Patient however denied shortness of breath or chest pain. BNP elevated in the 600s. Chest x-ray with no significant abnormalities. Continue current regimen of aspirin and Plavix. Cardizem CD, Imdur, Toprol-XL on hold secondary to borderline blood pressure. Continue statin. Discontinued IV Lasix with worsening renal function  and hyponatremia. Patient was to get a spinal injection in the outpatient setting Plavix will likely need to be discontinued at that time however will defer to pain management doctor at this time.  #8 COPD/polymyalgia rheumatica Stable. Continue home regimen of prednisone 10 mg daily. Patient on chronic prednisone and increased risk for avascular necrosis, pathologic fracture, osteoporosis. D/c Motrin due to worsening renal function.   #9 MGUS Outpatient follow-up with hematology/oncology.  #10 hypertension Blood pressure in the 130s today from 90s yesterday 03/22/2017. Continue to hold antihypertensive medications. Gentle hydration. Follow.   #11 constipation Continue current regimen of MiraLAX twice daily, Senokot S twice daily. Patient with abdominal distention and probable urinary retention 03/20/2017 which improved after smog enema was given with good results.  #12 urinary retention Patient was noted to have urinary retention 03/20/2017 and foley catheter placed. Patient started on Flomax. May consider voiding trial in the next 3-4 days.  #13 dysphagia During last admission speech therapy evaluated the patient and recommended dysphagia 3 diet. It is noted that patient's daughter states patient on a regular diet at home. Patient was tolerating diet of pured food which per nursing daughter orders prior to patient's lethargy. Patient more alert today has been assessed by the speech therapist and placed on a dysphagia 1 diet with thin liquids.    #14  hyponatremia ?? Acute on Chronic hyponatremia. ?? SIADH versus hypovolemia. Patient with a worsening hyponatremia initially felt may have been secondary to hypervolemic hyponatremia as hyponatremia improved with some diuresis initially. Urine osmolality was 168 and low, urine sodium was 29, serum osmolality was decreased at 272. Sodium levels worsened after gentle hydration. IV fluids were discontinued. TSH within normal limits at 0.898. Patient  given Lasix 40 mg IV  1 03/17/2017 with some improvement with hyponatremia. Patient given Lasix 20 mg IV 1 03/18/2017 with a slight decrease in sodium levels.   Patient given Lasix 60 mg IV the morning of 03/21/2017 and then 80 mg IV the evening of 03/21/2017.  . Sodium level   123 on 03/22/2017 with no significant improvement. Urine sodium was less than 10. Urine osmolality 247, urine creatinine 60.25. Serum osmolality of 273. Uric acid level was 8. Patient placed on IV fluids per nephrology with improvement in hyponatremia sodium level currently at 131  from 123 on 10/10 /2018. Patient alert sitting up in chair following commands. Neurontin was discontinued.  Appreciate nephrology input and recommendations.   #15 chest pain Chest wall tender to palpation. Chest pain is reproducible. Likely musculoskeletal in nature. Place on scheduled Tylenol.  # 16 Neck mass Patient has been complaining about neck pain , palp able mass in the right tonsillar area. Daughter by the bedside requesting CT neck for further evaluation   DVT prophylaxis: Lovenox Code Status: Full Family Communication: Updated daughter at bedside. Disposition Plan: Likely skilled nursing facility when medically stable.    Consultants:   Infectious disease: Dr.Comer 03/15/2017  Nephrology: Dr. Joelyn Oms 03/22/2017  Dr. Ninfa Linden orthopedics  Procedures:   CT right hip 03/15/2017  Chest x-ray 03/14/2017, 03/20/2017  Abdominal ultrasound 03/15/2017  Plain films of the right hip and pelvis 03/14/2017  CT head 03/21/2017  CT chest 03/21/2017  Antimicrobials:   IV vancomycin 03/14/2017>>>>> 03/15/2017  IV Zosyn 03/14/2017>>>> 03/15/2017  IV vancomycin 04/01/2017>>>>> 03/23/2017  IV cefepime 03/22/2017   Subjective:  Patient is sitting up in the chair, still having right hip pain, daughter is concerned that the patient will not be able to participate in physical therapy upon discharge to SNF. Requesting orthopedic  consultation. Also complaining off neck mass in the right tonsillar area. I'm unable to palpate this  Objective: Vitals:   03/23/17 2016 03/23/17 2310 03/24/17 0558 03/24/17 0736  BP:  (!) 144/69 (!) 151/72   Pulse:  97 86   Resp:  16 16   Temp:  97.7 F (36.5 C) 97.9 F (36.6 C)   TempSrc:  Oral Oral   SpO2: 96% 100% 98% 99%  Weight:      Height:        Intake/Output Summary (Last 24 hours) at 03/24/17 1232 Last data filed at 03/24/17 1149  Gross per 24 hour  Intake              830 ml  Output             3053 ml  Net            -2223 ml   Filed Weights   03/20/17 0442 03/21/17 0444 03/22/17 0930  Weight: 69.4 kg (153 lb) 70 kg (154 lb 5.2 oz) 64.4 kg (142 lb)    Examination:  General exam: Sleeping. Respiratory system: less rhonchorous breath sounds. Respiratory effort normal. Cardiovascular system: Regular rate and rhythm. No murmurs rubs or gallops. No lower extremity edema. Chest wall tender to palpation. Gastrointestinal system: Abdomen is soft, nontender, mildly distended, positive bowel sounds.  Central nervous system: Awake, alert and oriented. No focal neurological deficits. Extremities: Symmetric 5 x 5 power. Skin: Rash on back Psychiatry: Judgement and insight appear fair. Mood & affect appropriate.     Data Reviewed: I have personally reviewed following labs and imaging studies  CBC:  Recent Labs Lab 03/20/17 1330 03/21/17 0407 03/22/17 0428 03/23/17 0406 03/24/17 0410  WBC 3.5* 17.2* 19.8* 14.2* 14.7*  NEUTROABS 1.6* 13.4* 17.7* 12.2*  --   HGB 11.1* 9.5* 9.3* 9.2* 8.7*  HCT 32.2* 27.3* 27.3* 26.7* 24.7*  MCV 90.4 88.9 89.8 87.8 89.5  PLT 118* 221 210 193 671   Basic Metabolic Panel:  Recent Labs Lab 03/22/17 0428 03/22/17 1534 03/23/17 0406 03/23/17 1549 03/24/17 0410  NA 123* 125* 128* 130* 131*  K 4.6 4.2 4.0 4.2 4.6  CL 84* 86* 90* 94* 97*  CO2 26 25 25 25 26   GLUCOSE 118* 127* 127* 249* 124*  BUN 46* 54* 59* 61* 53*    CREATININE 2.38* 2.68* 2.18* 1.80* 1.43*  CALCIUM 8.2* 8.2* 8.0* 8.4* 8.1*   GFR: Estimated Creatinine Clearance: 29.3 mL/min (A) (by C-G formula based on SCr of 1.43 mg/dL (H)). Liver Function Tests: No results for input(s): AST, ALT, ALKPHOS, BILITOT, PROT, ALBUMIN in the last 168 hours. No results for input(s): LIPASE, AMYLASE in the last 168 hours.  Recent Labs Lab 03/21/17 1352  AMMONIA 12   Coagulation Profile: No results for input(s): INR, PROTIME in the last 168 hours. Cardiac Enzymes: No results for input(s): CKTOTAL, [name], [name], TROPONINI in the last 168 hours. BNP (last 3 results) No results for input(s): PROBNP in the last 8760 hours. HbA1C: No results for input(s): HGBA1C in the last 72 hours. CBG:  Recent Labs Lab 03/23/17 0754 03/23/17 1211 03/23/17 1750 03/24/17 0818 03/24/17 1222  GLUCAP 96 293* 219* 115* 257*   Lipid Profile: No results for input(s): CHOL, [name], [name], [name], [name], LDLDIRECT in the last 72 hours. Thyroid Function Tests: No results for input(s): TSH, T4TOTAL, FREET4, T3FREE, THYROIDAB in the last 72 hours. Anemia Panel: No results for input(s): VITAMINB12, FOLATE, FERRITIN, TIBC, IRON, RETICCTPCT in the last 72 hours. Sepsis Labs: No results for input(s): PROCALCITON, LATICACIDVEN in the last 168 hours.  Recent Results (from the past 240 hour(s))  Blood culture (routine x 2)     Status: None   Collection Time: 03/14/17  1:45 PM  Result Value Ref Range Status   Specimen Description BLOOD LEFT ANTECUBITAL  Final   Special Requests   Final    BOTTLES DRAWN AEROBIC AND ANAEROBIC Blood Culture adequate volume   Culture   Final    NO GROWTH 5 DAYS Performed at [facility], [address], [place], [place] [address]    Report Status 03/19/2017 FINAL  Final  Blood culture (routine x 2)     Status: None   Collection Time: 03/14/17  1:48 PM  Result Value Ref Range Status   Specimen Description BLOOD RIGHT ANTECUBITAL   Final   Special Requests   Final    BOTTLES DRAWN AEROBIC AND ANAEROBIC Blood Culture adequate volume   Culture   Final    NO GROWTH 5 DAYS Performed at [facility], [place] [address], [place], [place] [address]    Report Status 03/19/2017 FINAL  Final  Urine culture     Status: Abnormal   Collection Time: 03/14/17  2:44 PM  Result Value Ref Range Status   Specimen Description URINE, CATHETERIZED  Final   Special Requests NONE  Final   Culture >=100,000 COLONIES/mL PSEUDOMONAS AERUGINOSA (A)  Final   Report Status 03/16/2017 FINAL  Final   Organism ID, Bacteria PSEUDOMONAS AERUGINOSA (A)  Final      Susceptibility   Pseudomonas aeruginosa - MIC*    CEFTAZIDIME 4 SENSITIVE Sensitive     CIPROFLOXACIN <=0.25 SENSITIVE Sensitive  GENTAMICIN <=1 SENSITIVE Sensitive     IMIPENEM 2 SENSITIVE Sensitive     PIP/TAZO 8 SENSITIVE Sensitive     CEFEPIME 2 SENSITIVE Sensitive     * >=100,000 COLONIES/mL PSEUDOMONAS AERUGINOSA  Culture, Urine     Status: None   Collection Time: 03/20/17 12:23 PM  Result Value Ref Range Status   Specimen Description URINE, CATHETERIZED  Final   Special Requests NONE  Final   Culture   Final    NO GROWTH Performed at [facility], [place] [address], [place], [place] [address]    Report Status 03/22/2017 FINAL  Final  Culture, blood (routine x 2) Call MD if unable to obtain prior to antibiotics being given     Status: None (Preliminary result)   Collection Time: 03/22/17  9:20 AM  Result Value Ref Range Status   Specimen Description BLOOD RIGHT ARM  Final   Special Requests IN PEDIATRIC BOTTLE Blood Culture adequate volume  Final   Culture   Final    NO GROWTH 1 DAY Performed at [facility], [place] [address] [place], [place] [address]    Report Status PENDING  Incomplete  Culture, blood (routine x 2) Call MD if unable to obtain prior to antibiotics being given     Status: None (Preliminary result)   Collection Time: 03/22/17  9:20 AM    Result Value Ref Range Status   Specimen Description BLOOD LEFT ARM  Final   Special Requests IN PEDIATRIC BOTTLE Blood Culture adequate volume  Final   Culture   Final    NO GROWTH 1 DAY Performed at [facility], [place] [address] [place], [place] [address]    Report Status PENDING  Incomplete  MRSA PCR Screening     Status: None   Collection Time: 03/23/17 10:10 AM  Result Value Ref Range Status   MRSA by PCR NEGATIVE NEGATIVE Final    Comment:        The GeneXpert MRSA Assay (FDA approved for NASAL specimens only), is one component of a comprehensive MRSA colonization surveillance program. It is not intended to diagnose MRSA infection nor to guide or monitor treatment for MRSA infections.          Radiology Studies: No results found.      Scheduled Meds: . acetaminophen  500 mg Oral TID  . clopidogrel  75 mg Oral Q breakfast  . cloZAPine  300 mg Oral Q2000  . enoxaparin (LOVENOX) injection  30 mg Subcutaneous Q24H  . fluticasone  2 spray Each Nare Daily  . guaiFENesin  600 mg Oral BID  . insulin aspart  0-5 Units Subcutaneous QHS  . insulin aspart  0-9 Units Subcutaneous TID WC  . ipratropium-albuterol  3 mL Inhalation TID  . levothyroxine  75 mcg Oral QAC breakfast  . loratadine  10 mg Oral Daily  . mometasone-formoterol  2 puff Inhalation BID  . pantoprazole (PROTONIX) IV  40 mg Intravenous Q24H  . polyethylene glycol  17 g Oral BID  . pravastatin  20 mg Oral QHS  . predniSONE  10 mg Oral Q breakfast  . protein supplement shake  11 oz Oral BID BM  . senna-docusate  1 tablet Oral BID  . tamsulosin  0.4 mg Oral Daily   Continuous Infusions: . ceFEPime (MAXIPIME) IV Stopped (03/24/17 0931)     LOS: 8 days    Time spent: 35 minutes    Reyne Dumas, MD [facility] Pager [phone]  [phone]  If 7PM-7AM, please contact night-coverage [email or web address] Password [email or web address] 03/24/2017, 12:32 PM

## 2017-03-24 NOTE — Consult Note (Signed)
   [facility] CM Inpatient Consult   03/24/2017  Rebecca [name] 1941/01/14 [id number]   [facility] Care Management follow up.   Went to bedside to discuss potential [facility] Care Management services while at [facility].   However, she was off the unit upon writer's bedside visit.   Will attempt to come back at later time.    Marthenia Rolling, MSN-Ed, RN,BSN [facility] Liaison [phone]

## 2017-03-24 NOTE — Progress Notes (Signed)
Palliative Medicine Team consult was received.   Ms. Reedy was sleeping and her daughter had stepped out when I attempted to visit.    I called her daughter and we have set up a meeting for tomorrow at 1:30PM.  If there are urgent needs or questions please call [phone]. Thank you for consulting out team to assist with this patients care.  Micheline Rough, MD [place] Palliative Medicine Team [phone]  NO CHARGE NOTE

## 2017-03-25 DIAGNOSIS — E785 Hyperlipidemia, unspecified: Secondary | ICD-10-CM

## 2017-03-25 DIAGNOSIS — Z515 Encounter for palliative care: Secondary | ICD-10-CM

## 2017-03-25 DIAGNOSIS — Z7189 Other specified counseling: Secondary | ICD-10-CM

## 2017-03-25 LAB — COMPREHENSIVE METABOLIC PANEL
ALBUMIN: 2.9 g/dL — AB (ref 3.5–5.0)
ALT: 20 U/L (ref 14–54)
AST: 29 U/L (ref 15–41)
Alkaline Phosphatase: 48 U/L (ref 38–126)
Anion gap: 10 (ref 5–15)
BUN: 46 mg/dL — AB (ref 6–20)
CHLORIDE: 98 mmol/L — AB (ref 101–111)
CO2: 28 mmol/L (ref 22–32)
Calcium: 8.8 mg/dL — ABNORMAL LOW (ref 8.9–10.3)
Creatinine, Ser: 1.18 mg/dL — ABNORMAL HIGH (ref 0.44–1.00)
GFR calc Af Amer: 50 mL/min — ABNORMAL LOW (ref >60)
GFR calc non Af Amer: 43 mL/min — ABNORMAL LOW (ref >60)
GLUCOSE: 181 mg/dL — AB (ref 65–99)
POTASSIUM: 5.5 mmol/L — AB (ref 3.5–5.1)
Sodium: 136 mmol/L (ref 135–145)
Total Bilirubin: 0.5 mg/dL (ref 0.3–1.2)
Total Protein: 6.2 g/dL — ABNORMAL LOW (ref 6.5–8.1)

## 2017-03-25 LAB — CBC
HEMATOCRIT: 28.6 % — AB (ref 36.0–46.0)
Hemoglobin: 9.8 g/dL — ABNORMAL LOW (ref 12.0–15.0)
MCH: 31 pg (ref 26.0–34.0)
MCHC: 34.3 g/dL (ref 30.0–36.0)
MCV: 90.5 fL (ref 78.0–100.0)
Platelets: 265 10*3/uL (ref 150–400)
RBC: 3.16 MIL/uL — ABNORMAL LOW (ref 3.87–5.11)
RDW: 13.3 % (ref 11.5–15.5)
WBC: 15.2 10*3/uL — ABNORMAL HIGH (ref 4.0–10.5)

## 2017-03-25 LAB — GLUCOSE, CAPILLARY
GLUCOSE-CAPILLARY: 180 mg/dL — AB (ref 65–99)
Glucose-Capillary: 168 mg/dL — ABNORMAL HIGH (ref 65–99)
Glucose-Capillary: 250 mg/dL — ABNORMAL HIGH (ref 65–99)

## 2017-03-25 LAB — POTASSIUM: Potassium: 4.9 mmol/L (ref 3.5–5.1)

## 2017-03-25 MED ORDER — METOPROLOL SUCCINATE ER 50 MG PO TB24
100.0000 mg | ORAL_TABLET | Freq: Every day | ORAL | Status: DC
  Administered 2017-03-25 – 2017-03-26 (×2): 100 mg via ORAL
  Filled 2017-03-25 (×2): qty 2

## 2017-03-25 MED ORDER — AMOXICILLIN-POT CLAVULANATE 875-125 MG PO TABS
1.0000 | ORAL_TABLET | Freq: Two times a day (BID) | ORAL | Status: DC
  Administered 2017-03-25 – 2017-03-26 (×2): 1 via ORAL
  Filled 2017-03-25 (×2): qty 1

## 2017-03-25 MED ORDER — DICLOFENAC SODIUM 1 % TD GEL
2.0000 g | Freq: Four times a day (QID) | TRANSDERMAL | Status: DC
  Administered 2017-03-25 – 2017-03-26 (×4): 2 g via TOPICAL
  Filled 2017-03-25: qty 100

## 2017-03-25 MED ORDER — ENOXAPARIN SODIUM 40 MG/0.4ML ~~LOC~~ SOLN
40.0000 mg | SUBCUTANEOUS | Status: DC
  Administered 2017-03-25: 40 mg via SUBCUTANEOUS
  Filled 2017-03-25: qty 0.4

## 2017-03-25 MED ORDER — DILTIAZEM HCL ER COATED BEADS 180 MG PO CP24
180.0000 mg | ORAL_CAPSULE | Freq: Every day | ORAL | Status: DC
  Administered 2017-03-25 – 2017-03-26 (×2): 180 mg via ORAL
  Filled 2017-03-25 (×2): qty 1

## 2017-03-25 MED ORDER — DEXTROSE 5 % IV SOLN
1.0000 g | Freq: Two times a day (BID) | INTRAVENOUS | Status: DC
  Filled 2017-03-25: qty 1

## 2017-03-25 NOTE — Progress Notes (Signed)
SLP Cancellation Note  Patient Details Name: Rebecca [name] MRN: [id number] DOB: 28-Feb-1941   Cancelled treatment:       Reason Eval/Treat Not Completed: Other (comment) (pt to have palliative meeting today, will continue efforts)   Macario Golds 03/25/2017, 3:01 PM Luanna Salk, [place] [facility] SLP [phone]

## 2017-03-25 NOTE — Progress Notes (Signed)
Physical Therapy Treatment Patient Details Name: Rebecca [name] MRN: [id number] DOB: December 12, 1940 Today's Date: 03/25/2017    History of Present Illness PMH of COPD, chronic prednisone therapy, CAD, CKD stage IV, diastolic heart failure, MGUS, hypothyroidism, CVA, polymyalgia rheumatica; admitted on 03/14/2017, presented with complaint of right hip pain (likely combination from L5-S1 spondylosis, seen on MRI of lumbar spine in 2015 and muscular pain in gluteal region)    PT Comments    Pt AxO x 3 and pleasant but required MAX encouragement to participate.  Pt was able to self assist to EOB and self rise to standing.  Assisted with amb a limited distance as pt demonstrated increased anxiety and R hip pain when she approached the doorway.  Assisted to recliner and positioned to comfort.    Follow Up Recommendations  SNF ([facility])     Equipment Recommendations  None recommended by PT    Recommendations for Other Services       Precautions / Restrictions Precautions Precautions: Fall Restrictions Weight Bearing Restrictions: No    Mobility  Bed Mobility Overal bed mobility: Needs Assistance       Supine to sit: Supervision     General bed mobility comments: 25% VC's for direction, pt able to self perform  Transfers Overall transfer level: Needs assistance Equipment used: None Transfers: Sit to/from Stand Sit to Stand: Supervision;Min guard         General transfer comment: 25% VC's for direction but pt able to self rise.  50% VC's on proper hand placement with stand to sit.  Ambulation/Gait Ambulation/Gait assistance: Supervision;Min guard Ambulation Distance (Feet): 6 Feet Assistive device: Rolling walker (2 wheeled) Gait Pattern/deviations: Step-through pattern;Decreased stride length;Decreased stance time - right Gait velocity: decreased   General Gait Details: pt required MAX assist to amb and when she reached the door, pt demonstarted increased anxiety and  increased R hip pain insisting she could not go any further.  Pt tolerated full WBing but distance was self limited.     Stairs            Wheelchair Mobility    Modified Rankin (Stroke Patients Only)       Balance                                            Cognition Arousal/Alertness: Awake/alert Behavior During Therapy: WFL for tasks assessed/performed Overall Cognitive Status: Within Functional Limits for tasks assessed                                 General Comments: awake and following all commands       Exercises      General Comments        Pertinent Vitals/Pain Pain Assessment: No/denies pain Faces Pain Scale: Hurts even more Pain Location: during gait Pain Descriptors / Indicators: Discomfort;Grimacing Pain Intervention(s): Monitored during session;Repositioned    Home Living                      Prior Function            PT Goals (current goals can now be found in the care plan section) Progress towards PT goals: Progressing toward goals    Frequency    Min 3X/week      PT Plan Current plan  remains appropriate    Co-evaluation              AM-PAC PT "6 Clicks" Daily Activity  Outcome Measure  Difficulty turning over in bed (including adjusting bedclothes, sheets and blankets)?: A Lot Difficulty moving from lying on back to sitting on the side of the bed? : A Lot Difficulty sitting down on and standing up from a chair with arms (e.g., wheelchair, bedside commode, etc,.)?: A Lot Help needed moving to and from a bed to chair (including a wheelchair)?: A Lot Help needed walking in hospital room?: A Lot Help needed climbing 3-5 steps with a railing? : A Lot 6 Click Score: 12    End of Session Equipment Utilized During Treatment: Gait belt Activity Tolerance: Patient tolerated treatment well;Other (comment) Patient left: in chair;with family/visitor present;with call bell/phone within  reach   PT Visit Diagnosis: Difficulty in walking, not elsewhere classified (R26.2);Pain Pain - Right/Left: Right Pain - part of body: Leg     Time: 1140-1207 PT Time Calculation (min) (ACUTE ONLY): 27 min  Charges:  $Gait Training: 8-22 mins $Therapeutic Activity: 8-22 mins                    G Codes:       Rica Koyanagi  PTA [facility]  Acute  Rehab Pager      [phone]

## 2017-03-25 NOTE — Progress Notes (Signed)
PROGRESS NOTE    Rebecca [name]  ZOX:[id number] DOB: [birth year] DOA: 03/14/2017 PCP: [name], MD   Brief Narrative:  Pt. with PMH of COPD, chronic prednisone therapy, CAD status post DES to RCA in 2006, CKD stage IV, diastolic heart failure, MGUS, hypothyroidism ; admitted on 03/14/2017, presented with complaint of right hip pain, was found to have muscular pain. Currently further plan is pain control.   Assessment & Plan:   Principal Problem:   Acute right hip pain Active Problems:   HCAP (healthcare-associated pneumonia)   Hypothyroidism   Dyslipidemia   Anemia in chronic renal disease   GASTROESOPHAGEAL REFLUX, NO ESOPHAGITIS   Chronic diastolic (congestive) heart failure (HCC)   Polymyalgia rheumatica (HCC)   MGUS (monoclonal gammopathy of unknown significance)   CKD stage 4, GFR 15-29 ml/min    Essential hypertension   AKI (acute kidney injury) (HCC)   Pneumonia   Chronic constipation   Chronic pain disorder   Pain of right hip joint   Hypervolemia   Lethargy   Weakness   #1 right hip pain Likely secondary to L5-S1 spondylosis seen on MRI of the L-spine in 2015 with muscular pain in the gluteal region. CT right hip which was done 03/15/2017 with no acute osseous abnormalities, mild degenerative changes of the right hip joint with questionable chondrocalcinosis which could be age-related or reflect CPPD arthropathy. Patient still with significant pain and stated IV Dilaudid helped her pain however only lasted about 2 hours. Per family patient's oral Dilaudid dose was increased to 4 mg as needed per her hematologist/oncologist just prior to admission.   Patient's pain seemed to be under better control. Due to lethargy patient's long-acting MS Contin, IV Dilaudid were discontinued. Oral Dilaudid dose was decreased. Neurontin has been discontinued.  It is noted that patient scheduled to see an interventional pain doctors as an outpatient for her back pain and may benefit  from a spinal/steroid injection. PT/OT. Daughter insistent on patient being assessed by orthopedics either [name](Dr Aluisio) or [facility](Dr Faith). Patient's case was assessed in films reviewed by Dr. Ninfa Linden who had recommended intra-articular right hip steroid injection which patient received 03/24/2017 with clinical improvement. Appreciate Dr. Blackman/orthopedics input and recommendations.   #2 volume overload Patient sounded diffusely rhonchorous on lung exam with probable diffuse rales over past few days which has since improved. Patient with a increase in her weight from admission and as of 03/21/2017 patient's weight was 70 kg from 69.4 kg from 63 kg on admission 03/14/2017. Patient on IV Lasix with a good urine output of 1.3 L. Current weight 66.2 kg 03/25/2017 after gentle hydration with improvement in hyponatremia.  Chest x-ray with no significant acute abnormalities. BNP elevated. Discontinued IV Lasix. Patient currently either euvolemic or hypovolemic. Strict I's and O's. Daily weights.  #3 lethargy/acute metabolic encephalopathy Patient was lethargic and drowsy on 03/22/2017. Patient with significant improvement alert and oriented and sitting in the chair. Patient interacting. ?? Etiology. Likely secondary to worsening hyponatremia and HCAP.  Daughter was concerned that her mother likely has a urinary tract infection or some form of infection that is not being treated. Bladder scan did show some urinary retention and a such Foley cath was placed and patient started on Flomax. Patient noted to be constipated and patient given a smog enema with significant results. Abdomen is less tense and softer. BNP was noted to be elevated. Urinalysis with trace leukocytes nitrite -6-30 WBCs. Urine cultures negative. Chest x-ray with no significant acute  abnormalities. Discontinued IV Lasix secondary to worsening hyponatremia. CT head negative for any acute abnormalities.  Ammonia levels within normal limits. CT chest concerning for bibasilar pneumonia. ABG with no significant changes.  Discontinued long-acting MS Contin. Decreased breakthrough oral Dilaudid to 1 mg every 4 hours as needed. IV Dilaudid has been discontinued. Strict I's and O's. Daily weights.  #4 healthcare associated pneumonia- Chest x-ray obtained on admission had some concerns for possible pneumonia. Patient was seen in consultation by ID patient clinically does not have a pneumonia. Blood cultures obtained with no growth to date. IV antibiotics were discontinued by ID recommendations. Patient remained afebrile. Patient with a chronic leukocytosis.  Patient noted to be lethargic and as such CT chest was obtained without contrast which was concerning for bilateral lower lobe predominant pneumonia with dependent distribution concern for aspiration pneumonia especially given debris within the esophagus. Sputum Gram stain and culture pending. Urine Legionella antigen negative. Urine pneumococcus antigen negative. MRSA PCR negative. Discontinued IV vancomycin. Discontinue IV cefepime. Start oral Augmentin to complete course of antibiotic treatment. Follow.  #5 repeated UTIs/colonization Patient has had multiple rounds of antibiotics for UTIs last urine culture 03/04/2017 showing pseudomonas aeruginosa. Patient with a chronically elevated leukocytosis on chronic prednisone therapy. Urine cultures positive for Pseudomonas. Patient has been seen in consultation by ID who feel that patient likely has colonization rather than a true acute infection. IV antibiotics have been discontinued. Monitor.  #6 concerning for ascites--ruled out It was noted that patient was supposed to get a paracentesis as outpatient ordered per patient's oncologist. Patient daughter had insisted that ultrasound and paracentesis be done during this hospitalization and as such abdominal ultrasound was ordered which was negative for ascites.  Outpatient follow-up.  #7 coronary artery disease Patient asymptomatic. Patient with rhonchorous breath sounds and likely diffuse rales on examination a few days ago which has since improved. Patient however denied shortness of breath or chest pain. BNP elevated in the 600s. Chest x-ray with no significant abnormalities. Continue current regimen of aspirin and Plavix. Will resume home regimen of Cardizem CD, Toprol-XL which was held secondary to borderline blood pressure. Continue statin. Discontinued IV Lasix with worsening renal function and hyponatremia. Patient was to get a spinal injection in the outpatient setting Plavix will likely need to be discontinued at that time however will defer to pain management doctor at this time.  #8 COPD/polymyalgia rheumatica Stable. Continue home regimen of prednisone 10 mg daily. Patient on chronic prednisone and increased risk for avascular necrosis, pathologic fracture, osteoporosis. D/c Motrin due to worsening renal function.   #9 MGUS Outpatient follow-up with hematology/oncology.  #10 hypertension Patient's antihypertensive medications have been on hold secondary to borderline blood pressure. Will resume back home regimen of Cardizem and Toprol-XL.   #11 constipation Continue current regimen of MiraLAX twice daily, Senokot S twice daily. Patient with abdominal distention and probable urinary retention 03/20/2017 which improved after smog enema was given with good results.  #12 urinary retention Patient was noted to have urinary retention 03/20/2017 and foley catheter placed. Patient started on Flomax. Will discontinue Foley catheter for voiding trial today. If patient fails voiding Trial Will Pl., Foley back in and will need outpatient follow-up with urology.  #13 dysphagia During last admission speech therapy evaluated the patient and recommended dysphagia 3 diet. It is noted that patient's daughter stated patient on a regular diet at home. Patient  was tolerating diet of pured food which per nursing daughter orders prior to patient's lethargy. Patient more  alert today has been assessed by the speech therapist and placed on a dysphagia 1 diet with thin liquids. SLP ff.  #14  hyponatremia ?? Acute on Chronic hyponatremia. ?? SIADH versus hypovolemia. Patient with a worsening hyponatremia initially felt may have been secondary to hypervolemic hyponatremia as hyponatremia improved with some diuresis initially. Urine osmolality was 168 and low, urine sodium was 29, serum osmolality was decreased at 272. Sodium levels worsened after gentle hydration. IV fluids were discontinued. TSH within normal limits at 0.898. Patient given Lasix 40 mg IV 1 03/17/2017 with some improvement with hyponatremia. Patient given Lasix 20 mg IV 1 03/18/2017 with a slight decrease in sodium levels. Patient weight of 64.4 kg(03/22/2017) from 70 kg 03/21/2017 from 69.4 kg 03/20/2017 from 68 kg on 03/18/2017 from 63 kg on 03/14/2017. Patient given Lasix 60 mg IV the morning of 03/21/2017 and then 80 mg IV the evening of 03/21/2017. Urine output over the past 24 hours is not properly recorded. Sodium level now at 123 on 03/22/2017 with no significant improvement. Urine sodium was less than 10. Urine osmolality 247, urine creatinine 60.25. Serum osmolality of 273. Uric acid level was 8. Patient placed on IV fluids per nephrology with improvement in hyponatremia sodium level currently at 136 from 131 from 128 from 123 on 03/22/2017. Patient alert sitting up in chair following commands. Neurontin was discontinued.  Appreciate nephrology input and recommendations.   #15 chest pain Chest wall tenderness reproducible. Likely musculoskeletal in nature. Continue scheduled Tylenol.    DVT prophylaxis: Lovenox Code Status: Full Family Communication: Updated daughter at bedside. Disposition Plan: Likely skilled nursing facility when medically stable.    Consultants:   Infectious  disease: Dr.Comer 03/15/2017  Nephrology: Dr. Joelyn Oms 03/22/2017   Palliative care: Dr. Domingo Cocking 03/25/2017  Procedures:   CT right hip 03/15/2017  Chest x-ray 03/14/2017, 03/20/2017  Abdominal ultrasound 03/15/2017  Plain films of the right hip and pelvis 03/14/2017  CT head 03/21/2017  CT chest 03/21/2017  Intra-articular steroid hip injection under fluoroscopy 03/24/2017  Antimicrobials:   IV vancomycin 03/14/2017>>>>> 03/15/2017  IV Zosyn 03/14/2017>>>> 03/15/2017  IV vancomycin 04/01/2017>>>>> 03/23/2017  IV cefepime 03/22/2017>>>> 03/25/2017  Augmentin 03/25/2017   Subjective: Patient alerts sitting up and coloring. Patient states was able to get up and take a few steps with physical therapy today. Patient having good bowel movements. Patient states received an injection in her right hip yesterday 03/24/2017. Patient denies any shortness of breath. No chest pain.   Objective: Vitals:   03/25/17 0746 03/25/17 1436 03/25/17 1606 03/25/17 1653  BP:  (!) 199/87  (!) 167/88  Pulse:  (!) 105    Resp:  20    Temp:  98.4 F (36.9 C)    TempSrc:  Oral    SpO2: (!) 6% 100% 99%   Weight:      Height:        Intake/Output Summary (Last 24 hours) at 03/25/17 1813 Last data filed at 03/25/17 1437  Gross per 24 hour  Intake              630 ml  Output             2450 ml  Net            -1820 ml   Filed Weights   03/22/17 0930 03/24/17 1757 03/25/17 0428  Weight: 64.4 kg (142 lb) 68 kg (150 lb) 66.2 kg (146 lb)    Examination:  General exam: Sitting up in chair. Respiratory  system: Some scattered occasional rhonchorous breath sounds. No wheezing. Respiratory effort normal. Cardiovascular system: Regular rate and rhythm. No murmurs rubs or gallops. No lower extremity edema.  Gastrointestinal system: Abdomen is soft, nontender, mildly distended, positive bowel sounds.  Central nervous system: Awake, alert and oriented. No focal neurological  deficits. Extremities: Symmetric 5 x 5 power. Skin: Rash on back Psychiatry: Judgement and insight appear fair. Mood & affect appropriate.     Data Reviewed: I have personally reviewed following labs and imaging studies  CBC:  Recent Labs Lab 03/20/17 1330 03/21/17 0407 03/22/17 0428 03/23/17 0406 03/24/17 0410 03/25/17 0348  WBC 3.5* 17.2* 19.8* 14.2* 14.7* 15.2*  NEUTROABS 1.6* 13.4* 17.7* 12.2*  --   --   HGB 11.1* 9.5* 9.3* 9.2* 8.7* 9.8*  HCT 32.2* 27.3* 27.3* 26.7* 24.7* 28.6*  MCV 90.4 88.9 89.8 87.8 89.5 90.5  PLT 118* 221 210 193 222 932   Basic Metabolic Panel:  Recent Labs Lab 03/22/17 1534 03/23/17 0406 03/23/17 1549 03/24/17 0410 03/25/17 0348 03/25/17 0850  NA 125* 128* 130* 131* 136  --   K 4.2 4.0 4.2 4.6 5.5* 4.9  CL 86* 90* 94* 97* 98*  --   CO2 25 25 25 26 28   --   GLUCOSE 127* 127* 249* 124* 181*  --   BUN 54* 59* 61* 53* 46*  --   CREATININE 2.68* 2.18* 1.80* 1.43* 1.18*  --   CALCIUM 8.2* 8.0* 8.4* 8.1* 8.8*  --    GFR: Estimated Creatinine Clearance: 35.9 mL/min (A) (by C-G formula based on SCr of 1.18 mg/dL (H)). Liver Function Tests:  Recent Labs Lab 03/25/17 0348  AST 29  ALT 20  ALKPHOS 48  BILITOT 0.5  PROT 6.2*  ALBUMIN 2.9*   No results for input(s): LIPASE, AMYLASE in the last 168 hours.  Recent Labs Lab 03/21/17 1352  AMMONIA 12   Coagulation Profile: No results for input(s): INR, PROTIME in the last 168 hours. Cardiac Enzymes: No results for input(s): CKTOTAL, [name], [name], TROPONINI in the last 168 hours. BNP (last 3 results) No results for input(s): PROBNP in the last 8760 hours. HbA1C: No results for input(s): HGBA1C in the last 72 hours. CBG:  Recent Labs Lab 03/24/17 1222 03/24/17 1628 03/24/17 1929 03/25/17 0751 03/25/17 1732  GLUCAP 257* 200* 196* 168* 180*   Lipid Profile: No results for input(s): CHOL, [name], [name], [name], [name], LDLDIRECT in the last 72 hours. Thyroid Function  Tests: No results for input(s): TSH, T4TOTAL, FREET4, T3FREE, THYROIDAB in the last 72 hours. Anemia Panel: No results for input(s): VITAMINB12, FOLATE, FERRITIN, TIBC, IRON, RETICCTPCT in the last 72 hours. Sepsis Labs: No results for input(s): PROCALCITON, LATICACIDVEN in the last 168 hours.  Recent Results (from the past 240 hour(s))  Culture, Urine     Status: None   Collection Time: 03/20/17 12:23 PM  Result Value Ref Range Status   Specimen Description URINE, CATHETERIZED  Final   Special Requests NONE  Final   Culture   Final    NO GROWTH Performed at [facility], [address], [place], [place] [address]    Report Status 03/22/2017 FINAL  Final  Culture, blood (routine x 2) Call MD if unable to obtain prior to antibiotics being given     Status: None (Preliminary result)   Collection Time: 03/22/17  9:20 AM  Result Value Ref Range Status   Specimen Description BLOOD RIGHT ARM  Final   Special Requests IN PEDIATRIC  BOTTLE Blood Culture adequate volume  Final   Culture   Final    NO GROWTH 3 DAYS Performed at [facility], [place] [address], [place], [place] [address]    Report Status PENDING  Incomplete  Culture, blood (routine x 2) Call MD if unable to obtain prior to antibiotics being given     Status: None (Preliminary result)   Collection Time: 03/22/17  9:20 AM  Result Value Ref Range Status   Specimen Description BLOOD LEFT ARM  Final   Special Requests IN PEDIATRIC BOTTLE Blood Culture adequate volume  Final   Culture   Final    NO GROWTH 3 DAYS Performed at [facility], [place] [address], [place], [place] [address]    Report Status PENDING  Incomplete  MRSA PCR Screening     Status: None   Collection Time: 03/23/17 10:10 AM  Result Value Ref Range Status   MRSA by PCR NEGATIVE NEGATIVE Final    Comment:        The GeneXpert MRSA Assay (FDA approved for NASAL specimens only), is one component of a comprehensive MRSA colonization surveillance  program. It is not intended to diagnose MRSA infection nor to guide or monitor treatment for MRSA infections.          Radiology Studies: Dg Arthro Hip Right  Result Date: 03/24/2017 INDICATION: Right hip pain.  Right hip arthropathy. EXAM: THERAPEUTIC INJECTION OF RIGHT HIP JOINT UNDER FLUOROSCOPIC GUIDANCE COMPARISON:  CT of the right hip from 03/15/2017 FLUOROSCOPY TIME:  Fluoroscopy Time:  0 minutes, 14 seconds Radiation Exposure Index (if provided by the fluoroscopic device): 1.6 mGy Number of Acquired Spot Images: 0 COMPLICATIONS: None immediate. PROCEDURE: I discussed the risks (including hemorrhage, infection, and allergic reaction, among others), benefits, and alternatives to the procedure with the patient's daughter, who is currently making decisions for the patient due to altered mental status. We specifically discussed the high technical likelihood of success of the procedure. We discussed the elevated risk of bleeding complications due to a recent dose of Lovenox, although I still feel that the procedure is very safe given the planned needle size. The patient's daughter understood and elected for the patient to undergo the procedure. Standard time-out was employed. Following sterile skin prep and local anesthetic administration consisting of 1% lidocaine, a 22 gauge needle was advanced without difficulty into the right hip joint under fluoroscopic guidance. 6 cc of bupivacaine mixed with 80 mg of Depo-Medrol was injected into the joint without difficulty. The needle was subsequently removed and the skin cleansed and bandaged. No immediate complications were observed. IMPRESSION: Successful fluoroscopic guided therapeutic injection of the right hip. Electronically Signed   By: Van Clines M.D.   On: 03/24/2017 16:03   Ct Soft Tissue Neck Wo Contrast  Result Date: 03/24/2017 CLINICAL DATA:  76 y/o female with palpable mass right tonsillar area. Afebrile EXAM: CT NECK WITHOUT  CONTRAST TECHNIQUE: Multidetector CT imaging of the neck was performed following the standard protocol without intravenous contrast. COMPARISON:  Head CT 03/21/2017. Brain MRI 01/25/2017. Cervical spine CT 10/19/2016. Chest CT 03/21/2017. FINDINGS: Pharynx and larynx: Negative larynx. Mild motion artifact at the hypopharynx, but pharyngeal soft tissue contours appear stable since May and within normal limits. Negative parapharyngeal and retropharyngeal spaces. Salivary glands: Negative noncontrast sublingual space, submandibular and parotid glands. Thyroid: Diminutive, negative. Lymph nodes: Negative.  No cervical lymphadenopathy. Vascular: Vascular patency is not evaluated in the absence of IV contrast. Calcified aortic atherosclerosis. Mild  calcified carotid bifurcation atherosclerosis. Mild Calcified atherosclerosis at the skull base. Limited intracranial: Stable and negative. Visualized orbits: Negative. Mastoids and visualized paranasal sinuses: Stable mild left mastoid effusion. Other visible paranasal sinuses and mastoids are stable and well pneumatized. Skeleton: Absent dentition. Chronic cervical spine degeneration. No acute osseous abnormality identified. Upper chest: Partially visible patchy peribronchial opacity in the right lung as seen on the 03/21/2017 chest CT. Stable lung apices. Stable visualized superior mediastinum. IMPRESSION: 1. Negative for neck mass or lymphadenopathy. Negative noncontrast CT appearance of the neck soft tissues. 2. Continued patchy peribronchial opacity in the right upper lobe as seen on the Chest CT 03/21/2017. 3.  Aortic Atherosclerosis (ICD10-I70.0). Electronically Signed   By: Genevie Ann M.D.   On: 03/24/2017 14:24        Scheduled Meds: . acetaminophen  500 mg Oral TID  . clopidogrel  75 mg Oral Q breakfast  . cloZAPine  300 mg Oral Q2000  . diclofenac sodium  2 g Topical QID  . enoxaparin (LOVENOX) injection  40 mg Subcutaneous Q24H  . fluticasone  2 spray  Each Nare Daily  . guaiFENesin  600 mg Oral BID  . insulin aspart  0-5 Units Subcutaneous QHS  . insulin aspart  0-9 Units Subcutaneous TID WC  . ipratropium-albuterol  3 mL Inhalation TID  . levothyroxine  75 mcg Oral QAC breakfast  . loratadine  10 mg Oral Daily  . mometasone-formoterol  2 puff Inhalation BID  . pantoprazole (PROTONIX) IV  40 mg Intravenous Q24H  . polyethylene glycol  17 g Oral BID  . pravastatin  20 mg Oral QHS  . predniSONE  10 mg Oral Q breakfast  . protein supplement shake  11 oz Oral BID BM  . senna-docusate  1 tablet Oral BID  . tamsulosin  0.4 mg Oral Daily   Continuous Infusions: . ceFEPime (MAXIPIME) IV       LOS: 9 days    Time spent: 78 minutes    THOMPSON,DANIEL, MD [facility] Pager [phone]  If 7PM-7AM, please contact night-coverage [email or web address] Password [facility] 03/25/2017, 6:13 PM

## 2017-03-25 NOTE — Progress Notes (Signed)
PHARMACY NOTE:  ANTIMICROBIAL RENAL DOSAGE ADJUSTMENT  Current antimicrobial regimen includes a mismatch between antimicrobial dosage and estimated renal function.  As per policy approved by the Pharmacy & Therapeutics and Medical Executive Committees, the antimicrobial dosage will be adjusted accordingly.  Current antimicrobial dosage:  Cefepime 1gm q24h  Indication: PNA  Renal Function: scr improving  Estimated Creatinine Clearance: 35.9 mL/min (A) (by C-G formula based on SCr of 1.18 mg/dL (H)). []      On intermittent HD, scheduled: []      On CRRT    Antimicrobial dosage has been changed to:  Cefepime 1gm IV q12h  Additional comments:   Thank you for allowing pharmacy to be a part of this patient's care.  Lynelle Doctor, [facility] 03/25/2017 10:43 AM

## 2017-03-25 NOTE — Progress Notes (Signed)
Patient ID: Rebecca [name], female   DOB: June 29, 1940, 76 y.o.   MRN: [id number] I was able to see and exam the patient at the bedside.  She has right hip pain around the greater trochanteric area and right hip joint.  Her x-ray and CT scan findings only show mild arthritic changes and the CT scan suggests synovitis as well.  She did get an intra-articular right hip steroid injection yesterday.  On exam today, her pain seems to be only mild and I can easily rotate her right hip without difficulty.  She certainly will benefit from physical therapy and could even benefit from topical anti-inflammatory such as Voltaren Gel.  No further ortho recs and treatment required at this standpoint.  I did speak to her and her family at the bedside.

## 2017-03-26 DIAGNOSIS — D472 Monoclonal gammopathy: Secondary | ICD-10-CM | POA: Diagnosis not present

## 2017-03-26 DIAGNOSIS — M810 Age-related osteoporosis without current pathological fracture: Secondary | ICD-10-CM | POA: Diagnosis not present

## 2017-03-26 DIAGNOSIS — J189 Pneumonia, unspecified organism: Secondary | ICD-10-CM | POA: Diagnosis not present

## 2017-03-26 DIAGNOSIS — G9340 Encephalopathy, unspecified: Secondary | ICD-10-CM | POA: Diagnosis not present

## 2017-03-26 DIAGNOSIS — G894 Chronic pain syndrome: Secondary | ICD-10-CM | POA: Diagnosis not present

## 2017-03-26 DIAGNOSIS — Z7189 Other specified counseling: Secondary | ICD-10-CM | POA: Diagnosis not present

## 2017-03-26 DIAGNOSIS — M48061 Spinal stenosis, lumbar region without neurogenic claudication: Secondary | ICD-10-CM | POA: Diagnosis not present

## 2017-03-26 DIAGNOSIS — M1120 Other chondrocalcinosis, unspecified site: Secondary | ICD-10-CM | POA: Diagnosis not present

## 2017-03-26 DIAGNOSIS — M25551 Pain in right hip: Secondary | ICD-10-CM | POA: Diagnosis not present

## 2017-03-26 DIAGNOSIS — M7061 Trochanteric bursitis, right hip: Secondary | ICD-10-CM | POA: Diagnosis not present

## 2017-03-26 DIAGNOSIS — N189 Chronic kidney disease, unspecified: Secondary | ICD-10-CM | POA: Diagnosis not present

## 2017-03-26 DIAGNOSIS — N183 Chronic kidney disease, stage 3 (moderate): Secondary | ICD-10-CM | POA: Diagnosis not present

## 2017-03-26 DIAGNOSIS — Z7952 Long term (current) use of systemic steroids: Secondary | ICD-10-CM | POA: Diagnosis not present

## 2017-03-26 DIAGNOSIS — R0609 Other forms of dyspnea: Secondary | ICD-10-CM | POA: Diagnosis not present

## 2017-03-26 DIAGNOSIS — M150 Primary generalized (osteo)arthritis: Secondary | ICD-10-CM | POA: Diagnosis not present

## 2017-03-26 DIAGNOSIS — I5032 Chronic diastolic (congestive) heart failure: Secondary | ICD-10-CM | POA: Diagnosis not present

## 2017-03-26 DIAGNOSIS — D631 Anemia in chronic kidney disease: Secondary | ICD-10-CM | POA: Diagnosis not present

## 2017-03-26 DIAGNOSIS — R1312 Dysphagia, oropharyngeal phase: Secondary | ICD-10-CM | POA: Diagnosis not present

## 2017-03-26 DIAGNOSIS — R41841 Cognitive communication deficit: Secondary | ICD-10-CM | POA: Diagnosis not present

## 2017-03-26 DIAGNOSIS — M5416 Radiculopathy, lumbar region: Secondary | ICD-10-CM | POA: Diagnosis not present

## 2017-03-26 DIAGNOSIS — S79911A Unspecified injury of right hip, initial encounter: Secondary | ICD-10-CM | POA: Diagnosis not present

## 2017-03-26 DIAGNOSIS — J690 Pneumonitis due to inhalation of food and vomit: Secondary | ICD-10-CM | POA: Diagnosis not present

## 2017-03-26 DIAGNOSIS — R05 Cough: Secondary | ICD-10-CM | POA: Diagnosis not present

## 2017-03-26 DIAGNOSIS — M25559 Pain in unspecified hip: Secondary | ICD-10-CM | POA: Diagnosis not present

## 2017-03-26 DIAGNOSIS — R7989 Other specified abnormal findings of blood chemistry: Secondary | ICD-10-CM | POA: Diagnosis not present

## 2017-03-26 DIAGNOSIS — M6281 Muscle weakness (generalized): Secondary | ICD-10-CM | POA: Diagnosis not present

## 2017-03-26 DIAGNOSIS — I10 Essential (primary) hypertension: Secondary | ICD-10-CM | POA: Diagnosis not present

## 2017-03-26 DIAGNOSIS — I509 Heart failure, unspecified: Secondary | ICD-10-CM | POA: Diagnosis not present

## 2017-03-26 DIAGNOSIS — R278 Other lack of coordination: Secondary | ICD-10-CM | POA: Diagnosis not present

## 2017-03-26 DIAGNOSIS — M353 Polymyalgia rheumatica: Secondary | ICD-10-CM | POA: Diagnosis not present

## 2017-03-26 DIAGNOSIS — J449 Chronic obstructive pulmonary disease, unspecified: Secondary | ICD-10-CM | POA: Diagnosis not present

## 2017-03-26 DIAGNOSIS — G9341 Metabolic encephalopathy: Secondary | ICD-10-CM | POA: Diagnosis not present

## 2017-03-26 DIAGNOSIS — Z515 Encounter for palliative care: Secondary | ICD-10-CM | POA: Diagnosis not present

## 2017-03-26 DIAGNOSIS — R2681 Unsteadiness on feet: Secondary | ICD-10-CM | POA: Diagnosis not present

## 2017-03-26 DIAGNOSIS — D72829 Elevated white blood cell count, unspecified: Secondary | ICD-10-CM | POA: Diagnosis not present

## 2017-03-26 DIAGNOSIS — I2510 Atherosclerotic heart disease of native coronary artery without angina pectoris: Secondary | ICD-10-CM | POA: Diagnosis not present

## 2017-03-26 DIAGNOSIS — M7989 Other specified soft tissue disorders: Secondary | ICD-10-CM | POA: Diagnosis not present

## 2017-03-26 DIAGNOSIS — R918 Other nonspecific abnormal finding of lung field: Secondary | ICD-10-CM | POA: Diagnosis not present

## 2017-03-26 LAB — BASIC METABOLIC PANEL
Anion gap: 10 (ref 5–15)
BUN: 61 mg/dL — AB (ref 6–20)
CHLORIDE: 98 mmol/L — AB (ref 101–111)
CO2: 26 mmol/L (ref 22–32)
CREATININE: 1.19 mg/dL — AB (ref 0.44–1.00)
Calcium: 8.5 mg/dL — ABNORMAL LOW (ref 8.9–10.3)
GFR calc Af Amer: 49 mL/min — ABNORMAL LOW (ref >60)
GFR calc non Af Amer: 43 mL/min — ABNORMAL LOW (ref >60)
GLUCOSE: 143 mg/dL — AB (ref 65–99)
POTASSIUM: 5.1 mmol/L (ref 3.5–5.1)
SODIUM: 134 mmol/L — AB (ref 135–145)

## 2017-03-26 LAB — CBC
HCT: 27.8 % — ABNORMAL LOW (ref 36.0–46.0)
HEMOGLOBIN: 9.6 g/dL — AB (ref 12.0–15.0)
MCH: 31.5 pg (ref 26.0–34.0)
MCHC: 34.5 g/dL (ref 30.0–36.0)
MCV: 91.1 fL (ref 78.0–100.0)
Platelets: 268 10*3/uL (ref 150–400)
RBC: 3.05 MIL/uL — AB (ref 3.87–5.11)
RDW: 13.5 % (ref 11.5–15.5)
WBC: 16.6 10*3/uL — ABNORMAL HIGH (ref 4.0–10.5)

## 2017-03-26 LAB — GLUCOSE, CAPILLARY
GLUCOSE-CAPILLARY: 131 mg/dL — AB (ref 65–99)
GLUCOSE-CAPILLARY: 209 mg/dL — AB (ref 65–99)

## 2017-03-26 MED ORDER — AMOXICILLIN-POT CLAVULANATE 875-125 MG PO TABS: 1.0000 | ORAL_TABLET | Freq: Two times a day (BID) | ORAL | Status: DC

## 2017-03-26 MED ORDER — HYDROMORPHONE HCL 2 MG PO TABS: 2.0000 mg | ORAL_TABLET | Freq: Four times a day (QID) | ORAL | 0 refills | Status: DC | PRN

## 2017-03-26 MED ORDER — TAMSULOSIN HCL 0.4 MG PO CAPS: 0.4000 mg | ORAL_CAPSULE | Freq: Every day | ORAL | Status: AC

## 2017-03-26 NOTE — Discharge Instructions (Signed)
Follow with Primary MD [name], MD in 7 days   Get CBC, BMP, 2 view Chest X ray checked  By  SNF MD in 5-7 days    Activity: As tolerated with Full fall precautions use walker/cane & assistance as needed  Disposition SNF  Diet:  DYS 1 with feeding assistance and aspiration precautions. Low Carb - Renal diet ( per daughter's request)   For Heart failure patients - Check your Weight same time everyday, if you gain over 2 pounds, or you develop in leg swelling, experience more shortness of breath or chest pain, call your Primary MD immediately. Follow Cardiac Low Salt Diet and 1.5 lit/day fluid restriction.  On your next visit with your primary care physician please Get Medicines reviewed and adjusted.  Please request your Prim.MD to go over all Hospital Tests and Procedure/Radiological results at the follow up, please get all Hospital records sent to your Prim MD by signing hospital release before you go home.  If you experience worsening of your admission symptoms, develop shortness of breath, life threatening emergency, suicidal or homicidal thoughts you must seek medical attention immediately by calling 911 or calling your MD immediately  if symptoms less severe.  You Must read complete instructions/literature along with all the possible adverse reactions/side effects for all the Medicines you take and that have been prescribed to you. Take any new Medicines after you have completely understood and accpet all the possible adverse reactions/side effects.   Do not drive, operate heavy machinery, perform activities at heights, swimming or participation in water activities or provide baby sitting services if your were admitted for syncope or siezures until you have seen by Primary MD or a Neurologist and advised to do so again.  Do not drive when taking Pain medications.    Do not take more than prescribed Pain, Sleep and Anxiety Medications  Special Instructions: If you have smoked  or chewed Tobacco  in the last 2 yrs please stop smoking, stop any regular Alcohol  and or any Recreational drug use.  Wear Seat belts while driving.   Please note  You were cared for by a hospitalist during your hospital stay. If you have any questions about your discharge medications or the care you received while you were in the hospital after you are discharged, you can call the unit and asked to speak with the hospitalist on call if the hospitalist that took care of you is not available. Once you are discharged, your primary care physician will handle any further medical issues. Please note that NO REFILLS for any discharge medications will be authorized once you are discharged, as it is imperative that you return to your primary care physician (or establish a relationship with a primary care physician if you do not have one) for your aftercare needs so that they can reassess your need for medications and monitor your lab values.

## 2017-03-26 NOTE — Discharge Summary (Addendum)
Rebecca [name] IRS:[id number] DOB: 02/23/1941 DOA: 03/14/2017  PCP: [name], MD  Admit date: 03/14/2017  Discharge date: 03/26/2017  Admitted From: ALF  Disposition:  SNF   Recommendations for Outpatient Follow-up:   Follow up with PCP in 1-2 weeks  PCP Please obtain BMP/CBC, 2 view CXR in 1week,  (see Discharge instructions)   PCP Please follow up on the following pending results: None   Home Health: None   Equipment/Devices: None  Consultations: Ortho- Dr Ninfa Linden Discharge Condition: Fair   CODE STATUS: Full  Diet Recommendation: DIET - DYS 1 with feeding assistance and full aspiration precautions, 1.5 L per day total fluid restriction   Chief Complaint  Patient presents with  . Hip Pain     Brief history of present illness from the day of admission and additional interim summary    Pt. with PMH of COPD, chronic prednisone therapy, CAD status post DES to RCA in 2006, CKD stage IV, diastolic heart failure, MGUS, hypothyroidism ; admitted on 03/14/2017, presented with complaint of right hip pain, was found to have muscular pain. Currently further plan is pain control.                                                                 Hospital Course    #1 Right hip pain - Appears to have a right-sided trochanteric bursitis along with possibly some component of L5 S1 spondylosis on MRI seen in 2015  Was seen by orthopedics, underwent imaging of the right hip, patient underwent right intra-articular steroid injection on 03/24/2017 with significant relief, no further workup or intervention by Dr. Ninfa Linden orthopedics, continue supportive care PT and placement at SNF.   #2  Acute on chronic diastolic CHF with EF 00% on recent echocardiogram. Has been adequately diuresed now slightly on the drier side,  required some gentle hydration on 03/25/2017 for hyponatremia, sodium much better. At baseline on when necessary Lasix which should be continued along with 1.5 L total fluid restriction daily. Monitor weight and BMP closely at SNF.  #3 lethargy/acute metabolic encephalopathy - Likely secondary to worsening hyponatremia and HCAP. Some question of aspiration, mentation much improved after hyponatremia almost completely resolved with hydration and treatment of pneumonia with antibiotics, currently finishing Augmentin which should be continued for 3 more days, clinically no infection now, note patient on dysphagia 1 diet with feeding assistance and aspiration precautions which should be continued at SNF. Minimize narcotic use. Currently encephalopathy almost completely resolved and she is close to her baseline.   #4 healthcare associated pneumonia- Question aspiration, unconfirmed, currently tolerating dysphagia 1 diet and thin liquids with feeding assistance and aspiration precautions which should be continued, cultures negative, leukocytosis somewhat chronic, she clinically looks stable, no shortness of breath or cough, finish 3 more days of oral Augmentin,  please have speech therapy evaluate the patient at SNF.  #5 repeated UTIs/colonization Dysuria seen by ID who thought this is colonization.  #6 concerning for ascites--ruled out It was ruled out by negative ultrasound of the abdomen.  #7 coronary artery disease Patient asymptomatic. A new home regimen, note she is on aspirin and Plavix combination along with beta blocker and statin for secondary prevention, if she needs any shots in her spinal cord area Plavix should be held 5-7 days prior to it.  #8 COPD/polymyalgia rheumatica Stable. Continue home regimen of prednisone 10 mg daily. Patient on chronic prednisone and increased risk for avascular necrosis, pathologic fracture, osteoporosis. D/c Motrin due to worsening renal function.   #9  MGUS Outpatient follow-up with hematology/oncology.  #10 hypertension Patient's antihypertensive medications have been on hold secondary to borderline blood pressure. Currently on home regimen of Cardizem and Toprol-XL.   #11 constipation Continue current regimen of MiraLAX twice daily, Senokot S twice daily. Monitor bowel movements at SNF.  #12 urinary retention Has had issues with urinary retention in the past, currently on Flomax, Foley will be removed, if bladder emptying is satisfactory she will be sent to SNF without Foley, if she retains will replace Foley and sent to SNF where an outpatient urology follow up should be arranged within 5-7 days and Foley should be left in until that point.  #13 dysphagia Has had issues with dysphagia off and on, currently on dysphagia 1 diet along with thin liquids with feeding assistance aspiration precautions, please have speech evaluate the patient at SNF as well.  #14  hyponatremia  ?? Acute on Chronic hyponatremia. Initially SIADH then became hypovolemic and responded to IV fluids, sodium now close to normal, continue monitoring weight BMP closely at SNF, use Lasix when necessary.   #15 chest pain Chest wall tenderness reproducible. It was musculoskeletal and resolved.  #16. Hypothyroidism. On Synthroid, request SNF staff to check TSH and free T4 with next blood work.   Note daughter wishes that patient be followed with palliative care at [facility].  Discharge diagnosis     Principal Problem:   Acute right hip pain Active Problems:   Hypothyroidism   Dyslipidemia   Anemia in chronic renal disease   GASTROESOPHAGEAL REFLUX, NO ESOPHAGITIS   Chronic diastolic (congestive) heart failure (HCC)   Polymyalgia rheumatica (HCC)   MGUS (monoclonal gammopathy of unknown significance)   CKD stage 4, GFR 15-29 ml/min    Essential hypertension   HCAP (healthcare-associated pneumonia)   AKI (acute kidney injury) (HCC)   Pneumonia   Chronic  constipation   Chronic pain disorder   Pain of right hip joint   Hypervolemia   Lethargy   Weakness    Discharge instructions    Discharge Instructions    Discharge instructions    Complete by:  As directed    Follow with Primary MD [name], MD in 7 days   Get CBC, BMP, TSH, 2 view Chest X ray checked  By  SNF MD in 5-7 days    Activity: As tolerated with Full fall precautions use walker/cane & assistance as needed  Disposition SNF  Diet:   DIET - DYS 1 with feeding assistance and aspiration precautions.  For Heart failure patients - Check your Weight same time everyday, if you gain over 2 pounds, or you develop in leg swelling, experience more shortness of breath or chest pain, call your Primary MD immediately. Follow Cardiac Low Salt Diet and 1.5 lit/day fluid restriction.  On  your next visit with your primary care physician please Get Medicines reviewed and adjusted.  Please request your Prim.MD to go over all Hospital Tests and Procedure/Radiological results at the follow up, please get all Hospital records sent to your Prim MD by signing hospital release before you go home.  If you experience worsening of your admission symptoms, develop shortness of breath, life threatening emergency, suicidal or homicidal thoughts you must seek medical attention immediately by calling 911 or calling your MD immediately  if symptoms less severe.  You Must read complete instructions/literature along with all the possible adverse reactions/side effects for all the Medicines you take and that have been prescribed to you. Take any new Medicines after you have completely understood and accpet all the possible adverse reactions/side effects.   Do not drive, operate heavy machinery, perform activities at heights, swimming or participation in water activities or provide baby sitting services if your were admitted for syncope or siezures until you have seen by Primary MD or a Neurologist and  advised to do so again.  Do not drive when taking Pain medications.    Do not take more than prescribed Pain, Sleep and Anxiety Medications  Special Instructions: If you have smoked or chewed Tobacco  in the last 2 yrs please stop smoking, stop any regular Alcohol  and or any Recreational drug use.  Wear Seat belts while driving.   Please note  You were cared for by a hospitalist during your hospital stay. If you have any questions about your discharge medications or the care you received while you were in the hospital after you are discharged, you can call the unit and asked to speak with the hospitalist on call if the hospitalist that took care of you is not available. Once you are discharged, your primary care physician will handle any further medical issues. Please note that NO REFILLS for any discharge medications will be authorized once you are discharged, as it is imperative that you return to your primary care physician (or establish a relationship with a primary care physician if you do not have one) for your aftercare needs so that they can reassess your need for medications and monitor your lab values.   Increase activity slowly    Complete by:  As directed       Discharge Medications   Allergies as of 03/26/2017      Reactions   Levaquin [levofloxacin] Other (See Comments)   Ruptured tendon   Tape Other (See Comments)   PATIENT'S SKIN IS VERY, VERY THIN AND WILL TEAR AND BRUISE EASILY; PLEASE USE AN ALTERNATIVE (SOMETHING OTHER THAN TAPE)      Medication List    STOP taking these medications   cefUROXime 250 MG tablet Commonly known as:  CEFTIN   temazepam 15 MG capsule Commonly known as:  RESTORIL     TAKE these medications   acetaminophen 500 MG tablet Commonly known as:  TYLENOL Take 1 tablet (500 mg total) by mouth every 6 (six) hours as needed (pain). What changed:  how much to take  when to take this  reasons to take this   amoxicillin-clavulanate  875-125 MG tablet Commonly known as:  AUGMENTIN Take 1 tablet by mouth every 12 (twelve) hours. 3 more days   aspirin 81 MG EC tablet Take 81 mg by mouth daily as needed for pain. Swallow whole.   cetirizine 10 MG tablet Commonly known as:  ZYRTEC Take 10 mg by mouth at bedtime.  clopidogrel 75 MG tablet Commonly known as:  PLAVIX Take 1 tablet (75 mg total) by mouth daily with breakfast. What changed:  when to take this   cloZAPine 100 MG tablet Commonly known as:  CLOZARIL Take 300 mg by mouth at bedtime.   denosumab 60 MG/ML Soln injection Commonly known as:  PROLIA Inject 60 mg into the skin every 6 (six) months. Administer in upper arm, thigh, or abdomen   diltiazem 180 MG 24 hr tablet Commonly known as:  CARDIZEM LA Take 180 mg by mouth daily with breakfast.   Fluticasone-Salmeterol 250-50 MCG/DOSE Aepb Commonly known as:  ADVAIR Inhale 1 puff into the lungs at bedtime.   furosemide 20 MG tablet Commonly known as:  LASIX Take 1 tablet (20 mg total) by mouth daily as needed. What changed:  reasons to take this   GUAIFENESIN PO Take 400 mg by mouth 3 (three) times daily.   HYDROmorphone 2 MG tablet Commonly known as:  DILAUDID Take 1 tablet (2 mg total) by mouth every 6 (six) hours as needed for severe pain. What changed:  when to take this   ipratropium-albuterol 0.5-2.5 (3) MG/3ML Soln Commonly known as:  DUONEB Inhale 3 mLs into the lungs 3 (three) times daily.   isosorbide mononitrate 60 MG 24 hr tablet Commonly known as:  IMDUR Take 1 tablet (60 mg total) by mouth daily.   levothyroxine 75 MCG tablet Commonly known as:  SYNTHROID, LEVOTHROID Take 75 mcg by mouth daily before breakfast.   metoprolol succinate 100 MG 24 hr tablet Commonly known as:  TOPROL-XL Take 100 mg by mouth daily with breakfast. Take with or immediately following a meal.   multivitamin with minerals tablet Take 1 tablet by mouth at bedtime.   nitrofurantoin 100 MG  capsule Commonly known as:  MACRODANTIN Take 100 mg by mouth at bedtime. Filled 01/11/17 x 30 days   nitroGLYCERIN 0.4 MG SL tablet Commonly known as:  NITROSTAT Place 1 tablet (0.4 mg total) under the tongue every 5 (five) minutes as needed. For chest pain   NUTRITIONAL SHAKE HIGH PROTEIN PO Take 330 mLs by mouth daily as needed ("When not eating enough protein").   ondansetron 4 MG tablet Commonly known as:  ZOFRAN Take 1 tablet (4 mg total) by mouth every 8 (eight) hours as needed for nausea or vomiting.   pantoprazole 40 MG tablet Commonly known as:  PROTONIX Take 1 tablet (40 mg total) by mouth daily.   polyethylene glycol powder powder Commonly known as:  GLYCOLAX/MIRALAX Take 17 g by mouth 2 (two) times daily. MIX AND DRINK   pravastatin 20 MG tablet Commonly known as:  PRAVACHOL Take 20 mg by mouth at bedtime.   predniSONE 10 MG tablet Commonly known as:  DELTASONE Take 1 tablet (10 mg total) by mouth daily with breakfast.   PROBIOTIC PO Take 1 capsule by mouth daily.   PROCTOZONE-HC 2.5 % rectal cream Generic drug:  hydrocortisone Apply to rectum twice a day as needed for pain.   senna 8.6 MG Tabs tablet Commonly known as:  SENOKOT Take 2 tablets (17.2 mg total) by mouth at bedtime.   SM CRANBERRY 300 MG tablet Generic drug:  Cranberry Take 300 mg by mouth daily.   SPIRIVA RESPIMAT 2.5 MCG/ACT Aers Generic drug:  Tiotropium Bromide Monohydrate Inhale 2 puffs into the lungs every morning.   tamsulosin 0.4 MG Caps capsule Commonly known as:  FLOMAX Take 1 capsule (0.4 mg total) by mouth daily.   Vitamin  D (Ergocalciferol) 50000 units Caps capsule Commonly known as:  DRISDOL Take 1 capsule (50,000 Units total) by mouth every 7 (seven) days.            Durable Medical Equipment        Start     Ordered   03/19/17 [phone]  For home use only DME 3 n 1  Once     03/19/17 [id number]       Contact information for follow-up providers    [name],  MD. Schedule an appointment as soon as possible for a visit in 1 week(s).   Specialty:  Internal Medicine Contact information: [place] [address] [phone]            Contact information for after-discharge care    Destination    [facility] Follow up.   Specialty:  [place] Contact information: [place] [phone]                  Major procedures and Radiology Reports - PLEASE review detailed and final reports thoroughly  -         Dg Chest 2 View  Result Date: 03/20/2017 CLINICAL DATA:  76 y/o F; weakness and cough. History of CAD, COPD, pneumonia, diabetes, and stroke. History of cardiac cath in 2017. Former smoker. EXAM: CHEST  2 VIEW COMPARISON:  03/14/2017, 09/13/2016, 08/10/2015 chest radiograph. 01/26/2017 CT chest. FINDINGS: Stable normal cardiac silhouette given projection and technique. Aortic atherosclerosis with calcification. Improved aeration of the left lung base with mild residual streaky opacity. No new consolidation, effusion, or pneumothorax. Degenerative changes of bilateral glenohumeral joints and the thoracic spine. No acute osseous abnormality is evident. IMPRESSION: 1. Improved aeration of the left lung base with mild residual streaky opacity similar to prior radiographs, probably scarring and atelectasis. No new focal consolidation. 2. Aortic atherosclerosis. Electronically Signed   By: Kristine Garbe M.D.   On: 03/20/2017 16:43   Dg Chest 2 View  Result Date: 03/14/2017 CLINICAL DATA:  Worsening hip pain. EXAM: CHEST  2 VIEW COMPARISON:  01/25/2017 FINDINGS: Artifact overlies the chest. The right lung is clear. There is some chronic scarring at the left base, but this may be more pronounced, suggesting some left base atelectasis or infiltrate. No measurable effusion. No acute bone finding. IMPRESSION: Chronic scarring at the left  base, but possibly with some slightly worsened atelectasis or infiltrate. Electronically Signed   By: Nelson Chimes M.D.   On: 03/14/2017 14:39   Dg Arthro Hip Right  Result Date: 03/24/2017 INDICATION: Right hip pain.  Right hip arthropathy. EXAM: THERAPEUTIC INJECTION OF RIGHT HIP JOINT UNDER FLUOROSCOPIC GUIDANCE COMPARISON:  CT of the right hip from 03/15/2017 FLUOROSCOPY TIME:  Fluoroscopy Time:  0 minutes, 14 seconds Radiation Exposure Index (if provided by the fluoroscopic device): 1.6 mGy Number of Acquired Spot Images: 0 COMPLICATIONS: None immediate. PROCEDURE: I discussed the risks (including hemorrhage, infection, and allergic reaction, among others), benefits, and alternatives to the procedure with the patient's daughter, who is currently making decisions for the patient due to altered mental status. We specifically discussed the high technical likelihood of success of the procedure. We discussed the elevated risk of bleeding complications due to a recent dose of Lovenox, although I still feel that the procedure is very safe given the planned needle size. The patient's daughter understood and elected for the patient to undergo the procedure. Standard time-out was employed. Following sterile  skin prep and local anesthetic administration consisting of 1% lidocaine, a 22 gauge needle was advanced without difficulty into the right hip joint under fluoroscopic guidance. 6 cc of bupivacaine mixed with 80 mg of Depo-Medrol was injected into the joint without difficulty. The needle was subsequently removed and the skin cleansed and bandaged. No immediate complications were observed. IMPRESSION: Successful fluoroscopic guided therapeutic injection of the right hip. Electronically Signed   By: Van Clines M.D.   On: 03/24/2017 16:03   Ct Head Wo Contrast  Result Date: 03/21/2017 CLINICAL DATA:  76 year old female with increasing drowsiness and lethargy. EXAM: CT HEAD WITHOUT CONTRAST TECHNIQUE:  Contiguous axial images were obtained from the base of the skull through the vertex without intravenous contrast. COMPARISON:  Head CT 01/25/2017. FINDINGS: Brain: Patchy and confluent areas of decreased attenuation are noted throughout the deep and periventricular white matter of the cerebral hemispheres bilaterally, compatible with chronic microvascular ischemic disease. Well-defined areas of low attenuation lesions in the basal ganglia bilaterally, compatible with old lacunar infarcts. No evidence of acute infarction, hemorrhage, hydrocephalus, extra-axial collection or mass lesion/mass effect. Vascular: No hyperdense vessel or unexpected calcification. Skull: Normal. Negative for fracture or focal lesion. Sinuses/Orbits: Small left mastoid effusion is unchanged. No acute finding. Other: None. IMPRESSION: 1. No acute intracranial abnormalities. 2. 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 3. Small left mastoid effusion is unchanged. Electronically Signed   By: Vinnie Langton M.D.   On: 03/21/2017 15:51   Ct Soft Tissue Neck Wo Contrast  Result Date: 03/24/2017 CLINICAL DATA:  76 y/o female with palpable mass right tonsillar area. Afebrile EXAM: CT NECK WITHOUT CONTRAST TECHNIQUE: Multidetector CT imaging of the neck was performed following the standard protocol without intravenous contrast. COMPARISON:  Head CT 03/21/2017. Brain MRI 01/25/2017. Cervical spine CT 10/19/2016. Chest CT 03/21/2017. FINDINGS: Pharynx and larynx: Negative larynx. Mild motion artifact at the hypopharynx, but pharyngeal soft tissue contours appear stable since May and within normal limits. Negative parapharyngeal and retropharyngeal spaces. Salivary glands: Negative noncontrast sublingual space, submandibular and parotid glands. Thyroid: Diminutive, negative. Lymph nodes: Negative.  No cervical lymphadenopathy.  Vascular: Vascular patency is not evaluated in the absence of IV contrast. Calcified aortic atherosclerosis. Mild calcified carotid bifurcation atherosclerosis. Mild Calcified atherosclerosis at the skull base. Limited intracranial: Stable and negative. Visualized orbits: Negative. Mastoids and visualized paranasal sinuses: Stable mild left mastoid effusion. Other visible paranasal sinuses and mastoids are stable and well pneumatized. Skeleton: Absent dentition. Chronic cervical spine degeneration. No acute osseous abnormality identified. Upper chest: Partially visible patchy peribronchial opacity in the right lung as seen on the 03/21/2017 chest CT. Stable lung apices. Stable visualized superior mediastinum. IMPRESSION: 1. Negative for neck mass or lymphadenopathy. Negative noncontrast CT appearance of the neck soft tissues. 2. Continued patchy peribronchial opacity in the right upper lobe as seen on the Chest CT 03/21/2017. 3.  Aortic Atherosclerosis (ICD10-I70.0). Electronically Signed   By: Genevie Ann M.D.   On: 03/24/2017 14:24   Ct Chest Wo Contrast  Result Date: 03/21/2017 CLINICAL DATA:  COPD.  Possible pneumonia. EXAM: CT CHEST WITHOUT CONTRAST TECHNIQUE: Multidetector CT imaging of the chest was performed following the standard protocol without IV contrast. COMPARISON:  Chest radiograph 03/20/2017 FINDINGS: Cardiovascular: Normal heart size. No pericardial effusion. Ectasia and calcific atherosclerotic disease of the thoracic aorta. Irregularity of the contour of the aorta at the thoracic arch, with maximum diameter  of 2.7 cm. Crescent-shaped calcifications along the lateral border the proximal abdominal aorta, image 137/152, sequence 3. Mediastinum/Nodes: No enlarged mediastinal or axillary lymph nodes. Thyroid gland, and trachea demonstrate no significant findings. Debris within the dependent portion of the esophagus. Lungs/Pleura: Patchy areas of airspace consolidation in the right greater than left  lower lobe in dependent distribution. Smaller areas of airspace consolidation extend into the bilateral upper lobes. Upper Abdomen: No acute abnormality. Musculoskeletal: Apparent interruption of the right proximal humeral cortex may be artifactual. Multilevel osteoarthritic changes of the thoracic spine. IMPRESSION: Ectasia and calcific atherosclerotic disease of the visualized aorta. Irregularity of the aortic contour at the thoracic arch, and intraluminal calcifications at the thoracic arch and at the level of the proximal abdominal aorta may represent rim calcified atherosclerotic plaque versus dissection of the aorta, incompletely evaluated due to lack of IV contrast. Bilateral lower lobe predominant pneumonia with dependent distribution. Query aspiration pneumonia, especially given debris within the esophagus. Apparent interruption of the cortex of the right proximal humerus, which may be due to motion artifact rather than representing a true fracture. Please correlate to possible point of tenderness. These results will be called to the ordering clinician or representative by the Radiologist Assistant, and communication documented in the PACS or zVision Dashboard. Electronically Signed   By: Fidela Salisbury M.D.   On: 03/21/2017 19:07   Ct Hip Right Wo Contrast  Result Date: 03/15/2017 CLINICAL DATA:  Worsening right hip pain after recent fall. EXAM: CT OF THE RIGHT HIP WITHOUT CONTRAST TECHNIQUE: Multidetector CT imaging of the right hip was performed according to the standard protocol. Multiplanar CT image reconstructions were also generated. COMPARISON:  Right hip x-rays from yesterday. CT abdomen pelvis dated January 16, 2017. FINDINGS: Bones/Joint/Cartilage No acute fracture or malalignment. No joint effusion. Degenerative changes of the right hip, pubic symphysis, and right sacroiliac joint. Subchondral cystic change in the right acetabulum. Questionable chondrocalcinosis in the right hip joint.  Osteopenia. Ligaments Suboptimally assessed by CT. Muscles and Tendons Dystrophic calcifications at the right hamstring origins, consistent with prior injury. No significant muscle atrophy. Soft tissues Sigmoid diverticulosis.  No acute intrapelvic abnormality. IMPRESSION: 1.  No acute osseous abnormality. 2. Mild degenerative changes of the right hip joint with questionable chondrocalcinosis, which could be age related or reflect CPPD arthropathy. Electronically Signed   By: Titus Dubin M.D.   On: 03/15/2017 11:32   US Abdomen Limited  Result Date: 03/15/2017 CLINICAL DATA:  Possible ascites requiring paracentesis. EXAM: LIMITED ABDOMEN ULTRASOUND FOR ASCITES TECHNIQUE: Limited ultrasound survey for ascites was performed in all four abdominal quadrants. COMPARISON:  None. FINDINGS: Survey of the peritoneal cavity shows no ascites. Paracentesis was therefore not performed. IMPRESSION: No ascites identified in the peritoneal cavity. Electronically Signed   By: Aletta Edouard M.D.   On: 03/15/2017 15:30   Dg Hip Unilat W Or Wo Pelvis 2-3 Views Right  Result Date: 03/14/2017 CLINICAL DATA:  Recent fall. Worsening right hip pain for 1 week. Initial encounter. EXAM: DG HIP (WITH OR WITHOUT PELVIS) 2-3V RIGHT COMPARISON:  None. FINDINGS: There is no evidence of hip fracture or dislocation. There is no evidence of hip joint arthropathy or other focal bone abnormality. Mild degenerative changes are seen involving the pubic symphysis and visualized lower lumbar spine. IMPRESSION: No acute findings. Mild degenerative changes involving pubic symphysis and lower lumbar spine. Electronically Signed   By: Earle Gell M.D.   On: 03/14/2017 14:41    Micro Results  Recent Results (from the past 240 hour(s))  Culture, Urine     Status: None   Collection Time: 03/20/17 12:23 PM  Result Value Ref Range Status   Specimen Description URINE, CATHETERIZED  Final   Special Requests NONE  Final   Culture   Final      NO GROWTH Performed at [facility], [address], [place], [place] [address]    Report Status 03/22/2017 FINAL  Final  Culture, blood (routine x 2) Call MD if unable to obtain prior to antibiotics being given     Status: None (Preliminary result)   Collection Time: 03/22/17  9:20 AM  Result Value Ref Range Status   Specimen Description BLOOD RIGHT ARM  Final   Special Requests IN PEDIATRIC BOTTLE Blood Culture adequate volume  Final   Culture   Final    NO GROWTH 3 DAYS Performed at [facility], [place] [address] [place], [place] [address]    Report Status PENDING  Incomplete  Culture, blood (routine x 2) Call MD if unable to obtain prior to antibiotics being given     Status: None (Preliminary result)   Collection Time: 03/22/17  9:20 AM  Result Value Ref Range Status   Specimen Description BLOOD LEFT ARM  Final   Special Requests IN PEDIATRIC BOTTLE Blood Culture adequate volume  Final   Culture   Final    NO GROWTH 3 DAYS Performed at [facility], [place] [address], [place], [place] [address]    Report Status PENDING  Incomplete  MRSA PCR Screening     Status: None   Collection Time: 03/23/17 10:10 AM  Result Value Ref Range Status   MRSA by PCR NEGATIVE NEGATIVE Final    Comment:        The GeneXpert MRSA Assay (FDA approved for NASAL specimens only), is one component of a comprehensive MRSA colonization surveillance program. It is not intended to diagnose MRSA infection nor to guide or monitor treatment for MRSA infections.     Today   Subjective    Nitzia Speigner today has no headache,no chest abdominal pain,no new weakness tingling or numbness, feels much better wants to go to SNF today.    Objective   Blood pressure (!) 148/62, pulse 78, temperature 98.1 F (36.7 C), temperature source Oral, resp. rate 16, height 5\' 3"  (1.6 m), weight 69.4 kg (153 lb), SpO2 98 %.   Intake/Output Summary (Last 24 hours) at 03/26/17 1510 Last data filed  at 03/26/17 1315  Gross per 24 hour  Intake              920 ml  Output             2050 ml  Net            -1130 ml    Exam Awake Alert,   No new F.N deficits, Normal affect [place].AT,PERRAL Supple Neck,No JVD, No cervical lymphadenopathy appriciated.  Symmetrical Chest wall movement, Good air movement bilaterally, CTAB RRR,No Gallops,Rubs or new Murmurs, No Parasternal Heave +ve B.Sounds, Abd Soft, Non tender, No organomegaly appriciated, No rebound -guarding or rigidity. No Cyanosis, Clubbing or edema, No new Rash or bruise   Data Review   CBC w Diff:  Lab Results  Component Value Date   WBC 16.6 (H) 03/26/2017   HGB 9.6 (L) 03/26/2017   HGB 10.6 (L) 03/07/2017   HCT 27.8 (L) 03/26/2017   HCT 31.1 (L) 03/07/2017  PLT 268 03/26/2017   PLT 203 03/07/2017   PLT 234 02/25/2017   LYMPHOPCT 8 03/23/2017   LYMPHOPCT 5.5 (L) 03/07/2017   MONOPCT 6 03/23/2017   MONOPCT 3.9 03/07/2017   EOSPCT 0 03/23/2017   EOSPCT 0.4 03/07/2017   BASOPCT 0 03/23/2017   BASOPCT 0.1 03/07/2017    CMP:  Lab Results  Component Value Date   NA 134 (L) 03/26/2017   NA 135 01/11/2017   NA 141 09/27/2016   K 5.1 03/26/2017   K 4.8 09/27/2016   CL 98 (L) 03/26/2017   CO2 26 03/26/2017   CO2 24 09/27/2016   BUN 61 (H) 03/26/2017   BUN 37 (H) 01/11/2017   BUN 25.6 09/27/2016   CREATININE 1.19 (H) 03/26/2017   CREATININE 1.4 (H) 09/27/2016   GLU 79 09/20/2016   PROT 6.2 (L) 03/25/2017   PROT 5.9 (L) 09/27/2016   PROT 6.4 09/27/2016   ALBUMIN 2.9 (L) 03/25/2017   ALBUMIN 3.1 (L) 09/27/2016   BILITOT 0.5 03/25/2017   BILITOT 0.33 09/27/2016   ALKPHOS 48 03/25/2017   ALKPHOS 42 09/27/2016   AST 29 03/25/2017   AST 14 09/27/2016   ALT 20 03/25/2017   ALT 16 09/27/2016  .   Total Time in preparing paper work, data evaluation and todays exam - 73 minutes  Lala Lund M.D on 03/26/2017 at 3:10 PM  [facility]   Office  [phone]

## 2017-03-26 NOTE — Care Management Note (Signed)
Case Management Note  Patient Details  Name: Rebecca [name] MRN: [id number] Date of Birth: 1940-10-22  Subjective/Objective:   Right hip pain, L5-S1 spondylosis, CHF                 Action/Plan: Discharge Planning: Chart reviewed. Scheduled dc to SNF. CSW following for dc today to [facility]  PCP [name]  Expected Discharge Date:  03/26/17               Expected Discharge Plan:  [place]  In-House Referral:  Clinical Social Work  Discharge planning Services  CM Consult  Post Acute Care Choice:  NA Choice offered to:  NA  DME Arranged:  N/A DME Agency:  NA  HH Arranged:  NA HH Agency:  [facility]  Status of Service:  Completed, signed off  If discussed at [place] of Stay Meetings, dates discussed:    Additional Comments:  Erenest Rasher, RN 03/26/2017, 12:48 PM

## 2017-03-26 NOTE — Progress Notes (Signed)
Patient discharged to [facility] via [facility], discharge instructions reviewed with patients daughter who verbalized understanding. Report called to [facility] at [facility].

## 2017-03-26 NOTE — Progress Notes (Signed)
Palliative care progress note  Reason for follow-up: Goals of care in light of concern for recurrent aspiration  I met today with Rebecca [name]'s daughter, Rebecca [name].  We discussed plan for discharge as well as goals moving forward for her mother's continued care on transition to skilled facility.  We discussed that the hospital can be useful as long as she is getting well enough from care she receives at the hospital to enjoy time at home, but there is going to come a time where, if her goal is to be at home, she may be better served to plan on being at home and bringing care to him at home rather repeated trips to the hospital.   She is in agreement with plan for trial rehabilitation at skilled facility as they have previously been arranging. She has done well with rehabbing in the past. If she does well at home and continues to thrive, I encouraged they continue with this plan. If, however he is unable to regain function and he continues to decline, I recommended that she speak with the palliative team that we recommended follow her at skilled facility to determine how to best honor her mom's wishes for quality of life moving forward.  We again reviewed a most form, but she did not have a time to review it last evening. I advised her that the outpatient palliative team could continue to assist her with this at rehabilitation facility.  She had a few specific concerns regarding discharge including review of most recent lab work, imaging, and ensuring that her mother had a renal and diabetic diet ordered when she discharges. I relayed concerns for diet to Dr. Candiss Norse.  Total time: 40 minutes Greater than 50%  of this time was spent counseling and coordinating care related to the above assessment and plan.  Micheline Rough, MD [place] Team [phone]

## 2017-03-26 NOTE — Consult Note (Signed)
Consultation Note Date: 03/26/2017   Patient Name: Rebecca [name]  DOB: [birth year]  MRN: [id number]  Age / Sex: 76 y.o., female  PCP: [name], MD Referring Physician: Thurnell Lose, MD  Reason for Consultation: Establishing goals of care  HPI/Patient Profile: 76 y.o. female  with past medical history of  admitted on 03/14/2017 with hip pain.  She is at risk for recurrent aspirations and palliative consulted for family support and to discuss goals of care.   Clinical Assessment and Goals of Care: I met With [name] as well as with her daughter, Rebecca [name], outside of the room.  [name] reports that she is feeling better today and is sitting in bedside chair coloring. She is pleasant and smiling throughout encounter but is clear that she is confused.  Her daughter, Rebecca [name], and I spent time outside of the room discussing her clinical course over the past several months. She relayed to me that her mother had been in the hospital and was discharged home with hospice support when there was concern that she had a pancreatic mass that was possibly pancreatic cancer. It now appears that this is less likely her mother was discharged from hospice services that time. She does want her to continue to be followed by palliative care services when she leaves the hospital and transitioned to skilled nursing facility.  Discussed the overall goal for her mother is to add as much time and quality to her life as possible. Rebecca [name] reports that she has been seeing changes in her nutrition, functional status, and cognition and we discussed how these can continue to be road signs and how she is doing overall.  We discussed CODE STATUS in Rebecca [name] reports that the last time that her mother was asked she indicated that she would want to remain a full code and she is therefore honoring this wish. We reviewed a MOST form and discussed how  to develop plan of care to focus on continuing therapies that would maximize chance of being well enough to return home and limiting therapies not in line with this goal.  We discussed that planning for heroic measures at the end of her life is not likely to contribute to this goal.  Rebecca [name] is very invested in her mother's care and has detailed notes and questions regarding many aspects of plan for her mother.  I reviewed these with her to the best of my ability and also discussed with her primary hospitalist who has also been working to ensure her questions were answered as well as possible.  HCPOA/Dtr: [place] for transition to SNF with palliative follow-up. - I reviewed a MOST form and daughter will let me know if she would like to complete prior to discharge. - I left a copy of [facility] for [place] for her daughter to review.  Patient's daughter has several requests for her mother's care moving forward.  I have let the primary attending know that she is requesting  the following: 1) She would like to review labs and imaging results on day of discharge to "have a good idea where we are at that day" 2) She wants to know if MGUS is progressing to MM- recommend keep appt for f/u with [name]  (10/22) 3) She would like for her mother to be evaluated by endocrinology as an OP.  4) She requested referral to [name] for evaluation of her mother's hip. 5) She believes that her mother needs to be kept on antibiotics at discharge.  She report having an appointment with pulm on 10/16 ([name]) to discuss continued prophylactic antibiotics at the suggestion of her cardiologist. 6) She is concerned that her mother has not gotten her vitamin D tablet 7) She wants to ensure that her mother's scripts are filled at time of discharge and she gets the "correct medications."  She feels that certain generic brands of clozaril are not as effective for her  mother.   Code Status/Advance Care Planning:  Full code   Symptom Management:   Pain: Seems to be doing well at this time.  Would continue same, continue to monitor if further improvement following steroid injection, and recommend f/u with Dr. Maryjean Ka as scheduled  Psycho-social/Spiritual:   Desire for further Chaplaincy support:no  Additional Recommendations: Caregiving  Support/Resources  Prognosis:   Unable to determine  Discharge Planning: [name] for rehab with Palliative care service follow-up      Primary Diagnoses: Present on Admission: . Pneumonia . MGUS (monoclonal gammopathy of unknown significance) . Acute right hip pain . AKI (acute kidney injury) ([place]) . Anemia in chronic renal disease . Chronic constipation . Chronic diastolic (congestive) heart failure ([place]) . Chronic pain disorder . CKD stage 4, GFR 15-29 ml/min  . Dyslipidemia . Essential hypertension . GASTROESOPHAGEAL REFLUX, NO ESOPHAGITIS . Hypothyroidism . Polymyalgia rheumatica ([place]) . HCAP (healthcare-associated pneumonia)   I have reviewed the medical record, interviewed the patient and family, and examined the patient. The following aspects are pertinent.  Past Medical History:  Diagnosis Date  . AKI (acute kidney injury) ([place])   . Anemia in chronic renal disease   . Aortic atherosclerosis ([place]) 01/27/2017  . CAD (coronary artery disease) 2006   a. s/p DES to RCA in 2006 b. low-risk NST in 2014 c. cath in 08/2015 showing patent RCA stent with nonobstructive disease  . CKD (chronic kidney disease) stage 4, GFR 15-29 ml/min (H[facility])   . Complication of anesthesia   . COPD (chronic obstructive pulmonary disease) ([place])   . Diabetes mellitus   . Diastolic heart failure, NYHA class 1 ([place])   . Dyslipidemia   . Dysuria 01/06/2015  . Frozen shoulder    right  . Gastroesophageal reflux   . HAP (hospital-acquired pneumonia)   . Headache   . Hyperkalemia 01/01/2016  .  Hypothyroidism   . 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 Obesity   . Pancreatic mass 01/27/2017  . Pneumonia 06/2016  . Polymyalgia rheumatica ([place])   . PONV (postoperative nausea and vomiting)   . Renal insufficiency   . Respiratory distress   . Schizoaffective disorder ([place])   . Sepsis due to pneumonia ([place])   . Shortness of breath   . Stroke [facility])    Social History   Social History  . Marital status: Divorced    Spouse name: N/A  . Number of children: 3  . Years of education: 9th   Occupational History  . homemaker  Social History Main Topics  . Smoking status: Former Smoker    Packs/day: 1.00    Years: 35.00    Quit date: 04/14/2004  . Smokeless tobacco: Never Used  . Alcohol use No  . Drug use: No  . Sexual activity: No   Other Topics Concern  . None   Social History Narrative   Daughter Rebecca [name] and son are very involved.     Rebecca [name] comes to most office visits.   She now lives at home with daughter Rebecca [name]   Daughter is HCPOA, not averse to SNF/rehab in short-term, but does not wish pt to return to [facility] and requests that pt/pt's care not be discussed by [facility] providers to [facility], moving forward.   Caffeine Use: 1-3 cups daily   Family History  Problem Relation Age of Onset  . Diabetes Mother   . Heart disease Mother   . Heart disease Father   . Arthritis Father   . Healthy Sister   . Heart disease Brother   . Cancer Brother        colon ca  . Osteoporosis Sister   . Heart disease Brother   . Diabetes Brother   . Heart disease Brother   . Diabetes Brother   . Diabetes Brother   . Healthy Brother   . Healthy Brother   . Healthy Brother   . Cancer Maternal Uncle        blood condition  . Cancer Daughter        breast ca  . Cancer Cousin        NHL   Scheduled Meds: . acetaminophen  500 mg Oral TID  . amoxicillin-clavulanate  1 tablet Oral Q12H  . clopidogrel  75 mg Oral Q breakfast  . cloZAPine   300 mg Oral Q2000  . diclofenac sodium  2 g Topical QID  . diltiazem  180 mg Oral Q breakfast  . enoxaparin (LOVENOX) injection  40 mg Subcutaneous Q24H  . fluticasone  2 spray Each Nare Daily  . guaiFENesin  600 mg Oral BID  . insulin aspart  0-5 Units Subcutaneous QHS  . insulin aspart  0-9 Units Subcutaneous TID WC  . ipratropium-albuterol  3 mL Inhalation TID  . levothyroxine  75 mcg Oral QAC breakfast  . loratadine  10 mg Oral Daily  . metoprolol succinate  100 mg Oral Q breakfast  . mometasone-formoterol  2 puff Inhalation BID  . pantoprazole (PROTONIX) IV  40 mg Intravenous Q24H  . polyethylene glycol  17 g Oral BID  . pravastatin  20 mg Oral QHS  . predniSONE  10 mg Oral Q breakfast  . protein supplement shake  11 oz Oral BID BM  . senna-docusate  1 tablet Oral BID  . tamsulosin  0.4 mg Oral Daily   Continuous Infusions: PRN Meds:.acetaminophen, aspirin EC, diphenhydrAMINE-zinc acetate, HYDROmorphone, ipratropium-albuterol, ondansetron **OR** ondansetron (ZOFRAN) IV, temazepam Medications Prior to Admission:  Prior to Admission medications   Medication Sig Start Date End Date Taking? Authorizing Provider  aspirin 81 MG EC tablet Take 81 mg by mouth daily as needed for pain. Swallow whole.   Yes [provider]  cefUROXime (CEFTIN) 250 MG tablet Take 1 tablet (250 mg total) by mouth 2 (two) times daily with a meal. 03/07/17  Yes Gorsuch, Ni, MD  cetirizine (ZYRTEC) 10 MG tablet Take 10 mg by mouth at bedtime.    Yes [provider]  clopidogrel (PLAVIX) 75 MG tablet Take 1 tablet (  75 mg total) by mouth daily with breakfast. Patient taking differently: Take 75 mg by mouth at bedtime.  10/18/13  Yes Thurnell Lose, MD  cloZAPine (CLOZARIL) 100 MG tablet Take 300 mg by mouth at bedtime. 11/04/16  Yes [provider]  Cranberry (SM CRANBERRY) 300 MG tablet Take 300 mg by mouth daily.   Yes [provider]  denosumab (PROLIA) 60 MG/ML SOLN  injection Inject 60 mg into the skin every 6 (six) months. Administer in upper arm, thigh, or abdomen   Yes [provider]  diltiazem (CARDIZEM LA) 180 MG 24 hr tablet Take 180 mg by mouth daily with breakfast. 07/16/15  Yes [provider]  Fluticasone-Salmeterol (ADVAIR) 250-50 MCG/DOSE AEPB Inhale 1 puff into the lungs at bedtime.   Yes [provider]  GUAIFENESIN PO Take 400 mg by mouth 3 (three) times daily.    Yes [provider]  HYDROmorphone (DILAUDID) 2 MG tablet Take 1 tablet (2 mg total) by mouth every 4 (four) hours as needed for severe pain. 03/07/17  Yes Gorsuch, Ni, MD  ipratropium-albuterol (DUONEB) 0.5-2.5 (3) MG/3ML SOLN Inhale 3 mLs into the lungs 3 (three) times daily.  11/05/16  Yes [provider]  isosorbide mononitrate (IMDUR) 60 MG 24 hr tablet Take 1 tablet (60 mg total) by mouth daily. 03/14/13  Yes Isaiah Serge, NP  levothyroxine (SYNTHROID, LEVOTHROID) 75 MCG tablet Take 75 mcg by mouth daily before breakfast.   Yes [provider]  metoprolol succinate (TOPROL-XL) 100 MG 24 hr tablet Take 100 mg by mouth daily with breakfast. Take with or immediately following a meal.   Yes [provider]  Multiple Vitamins-Minerals (MULTIVITAMIN WITH MINERALS) tablet Take 1 tablet by mouth at bedtime.    Yes [provider]  nitrofurantoin (MACRODANTIN) 100 MG capsule Take 100 mg by mouth at bedtime. Filled 01/11/17 x 30 days 01/11/17  Yes [provider]  nitroGLYCERIN (NITROSTAT) 0.4 MG SL tablet Place 1 tablet (0.4 mg total) under the tongue every 5 (five) minutes as needed. For chest pain 01/11/17  Yes Strader, [place] M, PA-C  Nutritional Supplements (NUTRITIONAL SHAKE HIGH PROTEIN PO) Take 330 mLs by mouth daily as needed ("When not eating enough protein").    Yes [provider]  ondansetron (ZOFRAN) 4 MG tablet Take 1 tablet (4 mg total) by mouth every 8 (eight) hours as needed for nausea or  vomiting. 01/16/17  Yes Tegeler, Gwenyth Allegra, MD  pantoprazole (PROTONIX) 40 MG tablet Take 1 tablet (40 mg total) by mouth daily. 04/03/13  Yes Hilty, Nadean Corwin, MD  polyethylene glycol powder (GLYCOLAX/MIRALAX) powder Take 17 g by mouth 2 (two) times daily. MIX AND DRINK 01/28/17  Yes Samuella Cota, MD  pravastatin (PRAVACHOL) 20 MG tablet Take 20 mg by mouth at bedtime.   Yes [provider]  predniSONE (DELTASONE) 10 MG tablet Take 1 tablet (10 mg total) by mouth daily with breakfast. 10/11/16  Yes Charlynne Cousins, MD  Probiotic Product (PROBIOTIC PO) Take 1 capsule by mouth daily.   Yes [provider]  PROCTOZONE-HC 2.5 % rectal cream Apply to rectum twice a day as needed for pain. 02/25/17  Yes [provider]  senna (SENOKOT) 8.6 MG TABS tablet Take 2 tablets (17.2 mg total) by mouth at bedtime. 01/28/17  Yes Samuella Cota, MD  temazepam (RESTORIL) 15 MG capsule Take 15 mg by mouth at bedtime as needed for sleep.   Yes [provider]  Tiotropium Bromide Monohydrate (SPIRIVA RESPIMAT) 2.5 MCG/ACT AERS Inhale 2 puffs into the lungs every morning.    Yes [provider]  Vitamin D, Ergocalciferol, (DRISDOL) 50000 UNITS CAPS capsule Take 1 capsule (50,000 Units total) by mouth every 7 (seven) days. 08/27/13  Yes Kinnie Feil, MD  acetaminophen (TYLENOL) 500 MG tablet Take 1 tablet (500 mg total) by mouth every 6 (six) hours as needed (pain). Patient taking differently: Take 1,000 mg by mouth 2 (two) times daily as needed for moderate pain.  11/21/16   Arrien, Jimmy Picket, MD  furosemide (LASIX) 20 MG tablet Take 1 tablet (20 mg total) by mouth daily as needed. Patient taking differently: Take 20 mg by mouth daily as needed for fluid or edema.  12/02/16 03/02/17  Pixie Casino, MD   Allergies  Allergen Reactions  . Levaquin [Levofloxacin] Other (See Comments)    Ruptured tendon  . Tape Other (See Comments)    PATIENT'S SKIN IS  VERY, VERY THIN AND WILL TEAR AND BRUISE EASILY; PLEASE USE AN ALTERNATIVE (SOMETHING OTHER THAN TAPE)   Review of Systems  Denies complaints currently  Physical Exam General: Alert, awake, in no acute distress.  HEENT: No bruits, no goiter, no JVD Heart: Regular rate and rhythm. No murmur appreciated. Lungs: Good air movement, scattered rhonchi Abdomen: Soft, nontender, nondistended, positive bowel sounds.  Ext: No significant edema Skin: Warm and dry Neuro: Grossly intact, nonfocal.  Vital Signs: BP (!) 154/70 (BP Location: Right Arm)   Pulse 76   Temp 99 F (37.2 C) (Oral)   Resp 18   Ht 5' 3" (1.6 m)   Wt 69.4 kg (153 lb)   SpO2 98%   BMI 27.10 kg/m  Pain Assessment: 0-10 POSS *See Group Information*: S-Acceptable,Sleep, easy to arouse Pain Score: 2    SpO2: SpO2: 98 % O2 Device:SpO2: 98 % O2 Flow Rate: .O2 Flow Rate (L/min): 2 L/min  IO: Intake/output summary:  Intake/Output Summary (Last 24 hours) at 03/26/17 0824 Last data filed at 03/26/17 0514  Gross per 24 hour  Intake              360 ml  Output             1850 ml  Net            -1490 ml    LBM: Last BM Date: 03/23/17 Baseline Weight: Weight: 63 kg (139 lb) Most recent weight: Weight: 69.4 kg (153 lb)     Palliative Assessment/Data:   Flowsheet Rows     Most Recent Value  Intake Tab  Referral Department  Hospitalist  Unit at Time of Referral  Med/Surg Unit  Palliative Care Primary Diagnosis  Cancer  Date Notified  03/23/17  Palliative Care Type  Return patient Palliative Care  Reason for referral  Clarify Goals of Care  Date of Admission  03/14/17  Date first seen by Palliative Care  03/25/17  # of days Palliative referral response time  2 Day(s)  # of days IP prior to Palliative referral  9  Clinical Assessment  Palliative Performance Scale Score  40%  Pain Max last 24 hours  4  Pain Min Last 24 hours  0  Psychosocial & Spiritual Assessment  Palliative Care Outcomes  Patient/Family  meeting held?  Yes  Who was at the meeting?  Daughter/HCPOA  Palliative Care Outcomes  Clarified goals of care      Time In: 1330 Time Out: 1500 Time Total: 90 Greater  than 50%  of this time was spent counseling and coordinating care related to the above assessment and plan.  Signed by: Micheline Rough, MD   Please contact Palliative Medicine Team phone at [phone] for questions and concerns.  For individual provider: See Shea Evans

## 2017-03-26 NOTE — Clinical Social Work Placement (Addendum)
Pt and dtrs agreeable to discharge to [facility] today via [facility]. CSW confirmed with Colletta Maryland at [facility] and sent discharge summary via the [facility]. RN Jenny Reichmann aware and will call report to [phone] for room 103A. RN Jenny Reichmann to also arrange [facility] for transport when home medication is received from pharmacy. Dtr-Amanda Macek, Dr. Domingo Cocking, and Dr. Candiss Norse all aware of discharge plan. Stephanie at [facility] is aware that Palliative is to follow pt at facility. CSW signing off as no further Social Work needs identified.   CLINICAL SOCIAL WORK PLACEMENT  NOTE  Date:  03/26/2017  Patient Details  Name: Rebecca [name] MRN: [id number] Date of Birth: 12-11-1940  Clinical Social Work is seeking post-discharge placement for this patient at the [place] level of care (*CSW will initial, date and re-position this form in  chart as items are completed):  Yes   Patient/family provided with [place] Work Department's list of facilities offering this level of care within the geographic area requested by the patient (or if unable, by the patient's family).  Yes   Patient/family informed of their freedom to choose among providers that offer the needed level of care, that participate in Medicare, Medicaid or managed care program needed by the patient, have an available bed and are willing to accept the patient.  Yes   Patient/family informed of [facility]'s ownership interest in [facility] and [facility], as well as of the fact that they are under no obligation to receive care at these facilities.  PASRR submitted to EDS on 03/17/17     PASRR number received on 03/17/17     Existing PASRR number confirmed on       FL2 transmitted to all facilities in geographic area requested by pt/family on 03/17/17     FL2 transmitted to all facilities within larger geographic area on       Patient informed that his/her managed care company has contracts with or will negotiate with  certain facilities, including the following:        Yes   Patient/family informed of bed offers received.  Patient chooses bed at [place], [place]     Physician recommends and patient chooses bed at      Patient to be transferred to [facility] on 03/26/17.  Patient to be transferred to facility by [facility]AR     Patient family notified on 03/26/17 of transfer.  Name of family member notified:  Daughter [name] [phone]     PHYSICIAN Please prepare prescriptions     Additional Comment:    _______________________________________________ Truitt Merle, LCSW 03/26/2017, 4:15 PM

## 2017-03-27 DIAGNOSIS — I2510 Atherosclerotic heart disease of native coronary artery without angina pectoris: Secondary | ICD-10-CM | POA: Diagnosis not present

## 2017-03-27 DIAGNOSIS — J189 Pneumonia, unspecified organism: Secondary | ICD-10-CM | POA: Diagnosis not present

## 2017-03-27 DIAGNOSIS — I509 Heart failure, unspecified: Secondary | ICD-10-CM | POA: Diagnosis not present

## 2017-03-27 DIAGNOSIS — M353 Polymyalgia rheumatica: Secondary | ICD-10-CM | POA: Diagnosis not present

## 2017-03-27 DIAGNOSIS — J449 Chronic obstructive pulmonary disease, unspecified: Secondary | ICD-10-CM | POA: Diagnosis not present

## 2017-03-27 DIAGNOSIS — G9340 Encephalopathy, unspecified: Secondary | ICD-10-CM | POA: Diagnosis not present

## 2017-03-27 LAB — CULTURE, BLOOD (ROUTINE X 2)
CULTURE: NO GROWTH
Culture: NO GROWTH
Special Requests: ADEQUATE
Special Requests: ADEQUATE

## 2017-03-28 ENCOUNTER — Other Ambulatory Visit: Payer: Self-pay | Admitting: *Deleted

## 2017-03-28 NOTE — Consult Note (Addendum)
[facility] Care Management follow up. Chart reviewed. Noted Ms. Rebecca [name] discharged to [facility] over the weekend, 03/26/17, with palliative follow up. No identifiable [facility] Care Management needs.   Marthenia Rolling, MSN-Ed, RN,BSN [facility] Liaison [phone]

## 2017-03-29 ENCOUNTER — Ambulatory Visit (INDEPENDENT_AMBULATORY_CARE_PROVIDER_SITE_OTHER): Payer: Medicare Other | Admitting: Internal Medicine

## 2017-03-29 ENCOUNTER — Encounter: Payer: Self-pay | Admitting: Internal Medicine

## 2017-03-29 ENCOUNTER — Other Ambulatory Visit (INDEPENDENT_AMBULATORY_CARE_PROVIDER_SITE_OTHER): Payer: Medicare Other

## 2017-03-29 ENCOUNTER — Ambulatory Visit
Admission: RE | Admit: 2017-03-29 | Discharge: 2017-03-29 | Disposition: A | Discharge status: Home / Self Care | Payer: Medicare Other | Source: Ambulatory Visit | Attending: Internal Medicine | Admitting: Internal Medicine

## 2017-03-29 VITALS — BP 136/90 | HR 80 | Ht 63.0 in | Wt 134.0 lb

## 2017-03-29 DIAGNOSIS — R0609 Other forms of dyspnea: Secondary | ICD-10-CM | POA: Diagnosis not present

## 2017-03-29 DIAGNOSIS — R918 Other nonspecific abnormal finding of lung field: Secondary | ICD-10-CM | POA: Diagnosis not present

## 2017-03-29 DIAGNOSIS — R7989 Other specified abnormal findings of blood chemistry: Secondary | ICD-10-CM

## 2017-03-29 DIAGNOSIS — I2510 Atherosclerotic heart disease of native coronary artery without angina pectoris: Secondary | ICD-10-CM | POA: Diagnosis not present

## 2017-03-29 LAB — CBC WITH DIFFERENTIAL/PLATELET
BASOS PCT: 0.7 % (ref 0.0–3.0)
Basophils Absolute: 0.1 10*3/uL (ref 0.0–0.1)
EOS PCT: 0.9 % (ref 0.0–5.0)
Eosinophils Absolute: 0.2 10*3/uL (ref 0.0–0.7)
HCT: 31.9 % — ABNORMAL LOW (ref 36.0–46.0)
Hemoglobin: 10.9 g/dL — ABNORMAL LOW (ref 12.0–15.0)
Lymphocytes Relative: 15.7 % (ref 12.0–46.0)
Lymphs Abs: 2.7 10*3/uL (ref 0.7–4.0)
MCHC: 34.1 g/dL (ref 30.0–36.0)
MCV: 91.8 fl (ref 78.0–100.0)
Monocytes Absolute: 1.1 10*3/uL — ABNORMAL HIGH (ref 0.1–1.0)
Monocytes Relative: 6.4 % (ref 3.0–12.0)
NEUTROS PCT: 76.3 % (ref 43.0–77.0)
Neutro Abs: 13.2 10*3/uL — ABNORMAL HIGH (ref 1.4–7.7)
PLATELETS: 301 10*3/uL (ref 150.0–400.0)
RBC: 3.48 Mil/uL — ABNORMAL LOW (ref 3.87–5.11)
RDW: 13.8 % (ref 11.5–15.5)
WBC: 17.4 10*3/uL — AB (ref 4.0–10.5)

## 2017-03-29 LAB — SEDIMENTATION RATE: SED RATE: 38 mm/h — AB (ref 0–30)

## 2017-03-29 LAB — BRAIN NATRIURETIC PEPTIDE: Pro B Natriuretic peptide (BNP): 84 pg/mL (ref 0.0–100.0)

## 2017-03-29 MED ORDER — AMOXICILLIN-POT CLAVULANATE 875-125 MG PO TABS: 1.0000 | ORAL_TABLET | Freq: Two times a day (BID) | ORAL | 0 refills | Status: DC

## 2017-03-29 NOTE — Progress Notes (Signed)
Subjective:     Patient ID: Rebecca [name], female   DOB: 10/19/1940,   MRN: [id number]  HPI   9 yowm quit smoking 2005 under the care of Dr Elby Beck dx copd (no pfts in EPIC) doing well maint on advair hs / spiriva in am chronically then nebs since jan 2018 avg once daily then ? Pna Jan 2018 and then   Admit date: 03/14/2017  Discharge date: 03/26/2017  Admitted From: ALF  Disposition:  SNF   Recommendations for Outpatient Follow-up:   Follow up with PCP in 1-2 weeks  PCP Please obtain BMP/CBC, 2 view CXR in 1week,  (see Discharge instructions)   PCP Please follow up on the following pending results: None   Home Health: None   Equipment/Devices: None  Consultations: Ortho- Dr Ninfa Linden Discharge Condition: Fair   CODE STATUS: Full  Diet Recommendation: DIET - DYS 1 with feeding assistance and full aspiration precautions, 1.5 L per day total fluid restriction      Chief Complaint  Patient presents with  . Hip Pain     Brief history of present illness from the day of admission and additional interim summary    Pt. with PMH of COPD, chronic prednisone therapy, CAD status post DES to RCA in 2006, CKD stage IV, diastolic heart failure, MGUS, hypothyroidism ; admitted on 03/14/2017, presented with complaint of right hip pain, was found to have muscular pain. Currently further plan is pain control.                                                                 Hospital Course    #1 Right hip pain - Appears to have a right-sided trochanteric bursitis along with possibly some component of L5 S1 spondylosis on MRI seen in 2015  Was seen by orthopedics, underwent imaging of the right hip, patient underwent right intra-articular steroid injection on 03/24/2017 with significant relief, no further workup or intervention by Dr. Ninfa Linden orthopedics, continue supportive care PT and placement at SNF.   #2 Acute on chronic diastolic CHF with EF 40% on recent  echocardiogram. Has been adequately diuresed now slightly on the drier side, required some gentle hydration on 03/25/2017 for hyponatremia, sodium much better. At baseline on when necessary Lasix which should be continued along with 1.5 L total fluid restriction daily. Monitor weight and BMP closely at SNF.  #3 lethargy/acute metabolic encephalopathy - Likely secondary to worsening hyponatremia and HCAP. Some question of aspiration, mentation much improved after hyponatremia almost completely resolved with hydration and treatment of pneumonia with antibiotics, currently finishing Augmentin which should be continued for 3 more days, clinically no infection now, note patient on dysphagia 1 diet with feeding assistance and aspiration precautions which should be continued at SNF. Minimize narcotic use. Currently encephalopathy almost completely resolved and she is close to her baseline.   #4 healthcare associated pneumonia- Question aspiration, unconfirmed, currently tolerating dysphagia 1 diet and thin liquids with feeding assistance and aspiration precautions which should be continued, cultures negative, leukocytosis somewhat chronic, she clinically looks stable, no shortness of breath or cough, finish 3 more days of oral Augmentin, please have speech therapy evaluate the patient at SNF.  #5 repeated UTIs/colonization Dysuria seen by ID who thought this is colonization.  #6 concerning  for ascites--ruled out It was ruled out by negative ultrasound of the abdomen.  #7 coronary artery disease Patient asymptomatic. A new home regimen, note she is on aspirin and Plavix combination along with beta blocker and statin for secondary prevention, if she needs any shots in her spinal cord area Plavix should be held 5-7 days prior to it.  #8 COPD/polymyalgia rheumatica Stable. Continue home regimen of prednisone 10 mg daily. Patient on chronic prednisone and increased risk for avascular necrosis, pathologic  fracture, osteoporosis. D/c Motrin due to worsening renal function.   #9 MGUS Outpatient follow-up with hematology/oncology.  #10 hypertension Patient's antihypertensive medications have been on hold secondary to borderline blood pressure. Currently on home regimen of Cardizem and Toprol-XL.   #11 constipation Continue current regimen of MiraLAX twice daily, Senokot S twice daily. Monitor bowel movements at SNF.  #12 urinary retention Has had issues with urinary retention in the past, currently on Flomax, Foley will be removed, if bladder emptying is satisfactory she will be sent to SNF without Foley, if she retains will replace Foley and sent to SNF where an outpatient urology follow up should be arranged within 5-7 days and Foley should be left in until that point.  #13 dysphagia Has had issues with dysphagia off and on, currently on dysphagia 1 diet along with thin liquids with feeding assistance aspiration precautions, please have speech evaluate the patient at SNF as well.  #14 hyponatremia  ?? Acute on Chronic hyponatremia. Initially SIADH then became hypovolemic and responded to IV fluids, sodium now close to normal, continue monitoring weight BMP closely at SNF, use Lasix when necessary.   #15 chest pain Chest wall tenderness reproducible. It was musculoskeletal and resolved.  #16. Hypothyroidism. On Synthroid, request SNF staff to check TSH and free T4 with next blood work.   Note daughter wishes that patient be followed with palliative care at [facility].  Discharge diagnosis     Principal Problem:   Acute right hip pain Active Problems:   Hypothyroidism   Dyslipidemia   Anemia in chronic renal disease   GASTROESOPHAGEAL REFLUX, NO ESOPHAGITIS   Chronic diastolic (congestive) heart failure (HCC)   Polymyalgia rheumatica (HCC)   MGUS (monoclonal gammopathy of unknown significance)   CKD stage 4, GFR 15-29 ml/min    Essential hypertension   HCAP  (healthcare-associated pneumonia)   AKI (acute kidney injury) (HCC)   Pneumonia   Chronic constipation   Chronic pain disorder   Pain of right hip joint   Hypervolemia   Lethargy   Weakness    Discharge instructions        Discharge Instructions    Discharge instructions    Complete by:  As directed    Follow with Primary MD [name], MD in 7 days   Get CBC, BMP, TSH, 2 view Chest X ray checked  By  SNF MD in 5-7 days    Activity: As tolerated with Full fall precautions use walker/cane & assistance as needed  Disposition SNF  Diet:   DIET - DYS 1 with feeding assistance and aspiration precautions.  For Heart failure patients - Check your Weight same time everyday, if you gain over 2 pounds, or you develop in leg swelling, experience more shortness of breath or chest pain, call your Primary MD immediately. Follow Cardiac Low Salt Diet and 1.5 lit/day fluid restriction.  On your next visit with your primary care physician please Get Medicines reviewed and adjusted.  Please request your Prim.MD to go over  all Hospital Tests and Procedure/Radiological results at the follow up, please get all Hospital records sent to your Prim MD by signing hospital release before you go home.  If you experience worsening of your admission symptoms, develop shortness of breath, life threatening emergency, suicidal or homicidal thoughts you must seek medical attention immediately by calling 911 or calling your MD immediately  if symptoms less severe.  You Must read complete instructions/literature along with all the possible adverse reactions/side effects for all the Medicines you take and that have been prescribed to you. Take any new Medicines after you have completely understood and accpet all the possible adverse reactions/side effects.   Do not drive, operate heavy machinery, perform activities at heights, swimming or participation in water activities or provide baby  sitting services if your were admitted for syncope or siezures until you have seen by Primary MD or a Neurologist and advised to do so again.  Do not drive when taking Pain medications.    Do not take more than prescribed Pain, Sleep and Anxiety Medications  Special Instructions: If you have smoked or chewed Tobacco  in the last 2 yrs please stop smoking, stop any regular Alcohol  and or any Recreational drug use.  Wear Seat belts while driving.   Please note  You were cared for by a hospitalist during your hospital stay. If you have any questions about your discharge medications or the care you received while you were in the hospital after you are discharged, you can call the unit and asked to speak with the hospitalist on call if the hospitalist that took care of you is not available. Once you are discharged, your primary care physician will handle any further medical issues. Please note that NO REFILLS for any discharge medications will be authorized once you are discharged, as it is imperative that you return to your primary care physician (or establish a relationship with a primary care physician if you do not have one) for your aftercare needs so that they can reassess your need for medications and monitor your lab values.   Increase activity slowly    Complete by:  As directed       Discharge Medications       Allergies as of 03/26/2017      Reactions   Levaquin [levofloxacin] Other (See Comments)   Ruptured tendon   Tape Other (See Comments)   PATIENT'S SKIN IS VERY, VERY THIN AND WILL TEAR AND BRUISE EASILY; PLEASE USE AN ALTERNATIVE (SOMETHING OTHER THAN TAPE)                           Medication List               STOP taking these medications            cefUROXime 250 MG tablet Commonly known as:  CEFTIN    temazepam 15 MG capsule Commonly known as:  RESTORIL                       TAKE these medications            acetaminophen 500  MG tablet Commonly known as:  TYLENOL Take 1 tablet (500 mg total) by mouth every 6 (six) hours as needed (pain). What changed:  how much to take  when to take this  reasons to take this    amoxicillin-clavulanate 875-125 MG tablet Commonly known as:  AUGMENTIN Take 1 tablet by mouth every 12 (twelve) hours. 3 more days    aspirin 81 MG EC tablet Take 81 mg by mouth daily as needed for pain. Swallow whole.    cetirizine 10 MG tablet Commonly known as:  ZYRTEC Take 10 mg by mouth at bedtime.    clopidogrel 75 MG tablet Commonly known as:  PLAVIX Take 1 tablet (75 mg total) by mouth daily with breakfast. What changed:  when to take this    cloZAPine 100 MG tablet Commonly known as:  CLOZARIL Take 300 mg by mouth at bedtime.    denosumab 60 MG/ML Soln injection Commonly known as:  PROLIA Inject 60 mg into the skin every 6 (six) months. Administer in upper arm, thigh, or abdomen    diltiazem 180 MG 24 hr tablet Commonly known as:  CARDIZEM LA Take 180 mg by mouth daily with breakfast.    Fluticasone-Salmeterol 250-50 MCG/DOSE Aepb Commonly known as:  ADVAIR Inhale 1 puff into the lungs at bedtime.    furosemide 20 MG tablet Commonly known as:  LASIX Take 1 tablet (20 mg total) by mouth daily as needed. What changed:  reasons to take this    GUAIFENESIN PO Take 400 mg by mouth 3 (three) times daily.    HYDROmorphone 2 MG tablet Commonly known as:  DILAUDID Take 1 tablet (2 mg total) by mouth every 6 (six) hours as needed for severe pain. What changed:  when to take this    ipratropium-albuterol 0.5-2.5 (3) MG/3ML Soln Commonly known as:  DUONEB Inhale 3 mLs into the lungs 3 (three) times daily.    isosorbide mononitrate 60 MG 24 hr tablet Commonly known as:  IMDUR Take 1 tablet (60 mg total) by mouth daily.    levothyroxine 75 MCG tablet Commonly known as:  SYNTHROID, LEVOTHROID Take 75 mcg by mouth daily before breakfast.    metoprolol  succinate 100 MG 24 hr tablet Commonly known as:  TOPROL-XL Take 100 mg by mouth daily with breakfast. Take with or immediately following a meal.    multivitamin with minerals tablet Take 1 tablet by mouth at bedtime.    nitrofurantoin 100 MG capsule Commonly known as:  MACRODANTIN Take 100 mg by mouth at bedtime. Filled 01/11/17 x 30 days    nitroGLYCERIN 0.4 MG SL tablet Commonly known as:  NITROSTAT Place 1 tablet (0.4 mg total) under the tongue every 5 (five) minutes as needed. For chest pain    NUTRITIONAL SHAKE HIGH PROTEIN PO Take 330 mLs by mouth daily as needed ("When not eating enough protein").    ondansetron 4 MG tablet Commonly known as:  ZOFRAN Take 1 tablet (4 mg total) by mouth every 8 (eight) hours as needed for nausea or vomiting.    pantoprazole 40 MG tablet Commonly known as:  PROTONIX Take 1 tablet (40 mg total) by mouth daily.    polyethylene glycol powder powder Commonly known as:  GLYCOLAX/MIRALAX Take 17 g by mouth 2 (two) times daily. MIX AND DRINK    pravastatin 20 MG tablet Commonly known as:  PRAVACHOL Take 20 mg by mouth at bedtime.    predniSONE 10 MG tablet Commonly known as:  DELTASONE Take 1 tablet (10 mg total) by mouth daily with breakfast.    PROBIOTIC PO Take 1 capsule by mouth daily.    PROCTOZONE-HC 2.5 % rectal cream Generic drug:  hydrocortisone Apply to rectum twice a day as needed for pain.    senna 8.6 MG Tabs  tablet Commonly known as:  SENOKOT Take 2 tablets (17.2 mg total) by mouth at bedtime.    SM CRANBERRY 300 MG tablet Generic drug:  Cranberry Take 300 mg by mouth daily.    SPIRIVA RESPIMAT 2.5 MCG/ACT Aers Generic drug:  Tiotropium Bromide Monohydrate Inhale 2 puffs into the lungs every morning.    tamsulosin 0.4 MG Caps capsule Commonly known as:  FLOMAX Take 1 capsule (0.4 mg total) by mouth daily.    Vitamin D (Ergocalciferol) 50000 units Caps capsule Commonly known as:  DRISDOL Take 1  capsule (50,000 Units total) by mouth every 7 (seven) days.                                   03/29/2017 1st [facility] office visit/ [facility]   Chief Complaint  Patient presents with  . Pulmonary Consult    Referred by [facility] for eval of recurrent PNA.    pt doing better at present at [facility] and is under Dr America Brown care but  Daughter focused on the issue of how to treat ongoing concerns of aspiration pna and how long/ what abx should be used. Note seen by ID on 03/15/17 for same issue and abx were stopped and hospitalists restarted them on 03/22/18 IV due to lethargy, abn ct chest s reconsulting ID with temp never above 99 but daughter saying that's extremely high for the pt.  Note also d/c on DI diet. Presently still coughing some daytime but mucus is not purulent and no resting sob/ no cp  Pt focused on getting more pain meds for chronic back/R hip pain and carries dx of chronic pain disorder and pmr on pred chronically  per [facility]   No obvious patterns  day to day or daytime variability in any of her resp c/o' or assoc ongoing excess/ purulent sputum or mucus plugs or hemoptysis or cp or chest tightness, subjective wheeze or overt sinus or hb symptoms. No unusual exp hx or h/o childhood pna/ asthma or knowledge of premature birth.  Sleeping ok flat without nocturnal  or early am exacerbation  of respiratory  c/o's or need for noct saba. Also denies any obvious fluctuation of symptoms with weather or environmental changes or other aggravating or alleviating factors except as outlined above   Current Allergies, Complete Past Medical History, Past Surgical History, Family History, and Social History were reviewed in [employer] record.  ROS  The following are not active complaints unless bolded Hoarseness, sore throat, dysphagia, dental problems, itching, sneezing,  nasal congestion or discharge of excess mucus or purulent secretions, ear ache,    fever, chills, sweats, unintended wt loss or wt gain, classically pleuritic or exertional cp,  orthopnea pnd or leg swelling, presyncope, palpitations, abdominal pain, anorexia, nausea, vomiting, diarrhea  or change in bowel habits or change in bladder habits, change in stools or change in urine, dysuria, hematuria,  rash, arthralgias, visual complaints, headache, numbness, weakness or ataxia or problems with walking or coordination,  change in mood/affect or memory.        Current Meds  Medication Sig  . acetaminophen (TYLENOL) 500 MG tablet Take 1 tablet (500 mg total) by mouth every 6 (six) hours as needed (pain). (Patient taking differently: Take 1,000 mg by mouth 2 (two) times daily as needed for moderate pain. )  . amoxicillin-clavulanate (AUGMENTIN) 875-125 MG tablet Take 1 tablet by mouth every 12 (  twelve) hours. 3 more days  . cetirizine (ZYRTEC) 10 MG tablet Take 10 mg by mouth at bedtime.   . clopidogrel (PLAVIX) 75 MG tablet Take 1 tablet (75 mg total) by mouth daily with breakfast. (Patient taking differently: Take 75 mg by mouth at bedtime. )  . cloZAPine (CLOZARIL) 100 MG tablet Take 300 mg by mouth at bedtime.  . Cranberry (SM CRANBERRY) 300 MG tablet Take 300 mg by mouth daily.  Marland Kitchen diltiazem (CARDIZEM LA) 180 MG 24 hr tablet Take 180 mg by mouth daily with breakfast.  . Fluticasone-Salmeterol (ADVAIR) 250-50 MCG/DOSE AEPB Inhale 1 puff into the lungs at bedtime.  . furosemide (LASIX) 20 MG tablet Take 1 tablet (20 mg total) by mouth daily as needed. (Patient taking differently: Take 20 mg by mouth daily as needed for fluid or edema. )  . GUAIFENESIN PO Take 400 mg by mouth 3 (three) times daily.   Marland Kitchen HYDROmorphone (DILAUDID) 2 MG tablet Take 1 tablet (2 mg total) by mouth every 6 (six) hours as needed for severe pain.  Marland Kitchen ipratropium-albuterol (DUONEB) 0.5-2.5 (3) MG/3ML SOLN Inhale 3 mLs into the lungs 3 (three) times daily.   . isosorbide mononitrate (IMDUR) 60 MG 24 hr tablet Take 1  tablet (60 mg total) by mouth daily.  Marland Kitchen levothyroxine (SYNTHROID, LEVOTHROID) 75 MCG tablet Take 75 mcg by mouth daily before breakfast.  . metoprolol succinate (TOPROL-XL) 100 MG 24 hr tablet Take 100 mg by mouth daily with breakfast. Take with or immediately following a meal.  . nitroGLYCERIN (NITROSTAT) 0.4 MG SL tablet Place 1 tablet (0.4 mg total) under the tongue every 5 (five) minutes as needed. For chest pain  . ondansetron (ZOFRAN) 4 MG tablet Take 1 tablet (4 mg total) by mouth every 8 (eight) hours as needed for nausea or vomiting.  . pantoprazole (PROTONIX) 40 MG tablet Take 1 tablet (40 mg total) by mouth daily.  . polyethylene glycol powder (GLYCOLAX/MIRALAX) powder Take 17 g by mouth 2 (two) times daily. [place]  . pravastatin (PRAVACHOL) 20 MG tablet Take 20 mg by mouth at bedtime.  . predniSONE (DELTASONE) 10 MG tablet Take 1 tablet (10 mg total) by mouth daily with breakfast.  . Probiotic Product (PROBIOTIC PO) Take 1 capsule by mouth daily.  Marland Kitchen senna (SENOKOT) 8.6 MG TABS tablet Take 2 tablets (17.2 mg total) by mouth at bedtime.  . tamsulosin (FLOMAX) 0.4 MG CAPS capsule Take 1 capsule (0.4 mg total) by mouth daily.  . Tiotropium Bromide Monohydrate (SPIRIVA RESPIMAT) 2.5 MCG/ACT AERS Inhale 2 puffs into the lungs every morning.   . Vitamin D, Ergocalciferol, (DRISDOL) 50000 UNITS CAPS capsule Take 1 capsule (50,000 Units total) by mouth every 7 (seven) days.  . [DISCONTINUED] amoxicillin-clavulanate (AUGMENTIN) 875-125 MG tablet Take 1 tablet by mouth every 12 (twelve) hours. 3 more days         Review of Systems     Objective:   Physical Exam  W/c bound eldlerly wf prefers to let her Daughter do her talking   Wt Readings from Last 3 Encounters:  03/29/17 134 lb (60.8 kg)  03/26/17 153 lb (69.4 kg)  03/07/17 139 lb 11.2 oz (63.4 kg)    Vital signs reviewed  - Note on arrival 02 sats  98% on RA     HEENT: nl   turbinates bilaterally, and oropharynx. Nl  external ear canals without cough reflex - edentulous    NECK :  without JVD/Nodes/TM/ nl carotid upstrokes bilaterally  LUNGS: no acc muscle use,  Nl contour chest with minimal insp and exp rhonchi bilaterally s localized findings    CV:  RRR  no s3 or murmur or increase in P2, and no edema   ABD:  soft and nontender with nl inspiratory excursion in the supine position. No bruits or organomegaly appreciated, bowel sounds nl  MS:   ext warm without deformities, calf tenderness, cyanosis or clubbing No obvious joint restrictions   SKIN: warm and dry without lesions    NEURO:  alert, approp, nl sensorium with  no motor or cerebellar deficits apparent.     I personally reviewed images and agree with radiology impression as follows:  Chest CT 03/21/17   Bilateral lower lobe predominant pneumonia with dependent distribution. Query aspiration pneumonia, especially given debris within the esophagus.  Note 10/1 and 10/8 cxr's did not show any def as dz    Head CT  03/21/17  Sinuses/Orbits: Small left mastoid effusion is unchanged. No acute Finding.   03/29/2017 rec cxr > did not go  Labs ordered/ reviewed:      Chemistry      Component Value Date/Time   NA 134 (L) 03/26/2017 0401   NA 135 01/11/2017 1250   NA 141 09/27/2016 1047   K 5.1 03/26/2017 0401   K 4.8 09/27/2016 1047   CL 98 (L) 03/26/2017 0401   CO2 26 03/26/2017 0401   CO2 24 09/27/2016 1047   BUN 61 (H) 03/26/2017 0401   BUN 37 (H) 01/11/2017 1250   BUN 25.6 09/27/2016 1047   CREATININE 1.19 (H) 03/26/2017 0401   CREATININE 1.4 (H) 09/27/2016 1047   GLU 79 09/20/2016      Component Value Date/Time   CALCIUM 8.5 (L) 03/26/2017 0401   CALCIUM 9.4 09/27/2016 1047   ALKPHOS 48 03/25/2017 0348   ALKPHOS 42 09/27/2016 1047   AST 29 03/25/2017 0348   AST 14 09/27/2016 1047   ALT 20 03/25/2017 0348   ALT 16 09/27/2016 1047   BILITOT 0.5 03/25/2017 0348   BILITOT 0.33 09/27/2016 1047        Lab  Results  Component Value Date   WBC 17.4 (H) 03/29/2017   HGB 10.9 (L) 03/29/2017   HCT 31.9 (L) 03/29/2017   MCV 91.8 03/29/2017   PLT 301.0 03/29/2017         Lab Results  Component Value Date   TSH 0.898 03/17/2017     Lab Results  Component Value Date   PROBNP 84.0 03/29/2017       Lab Results  Component Value Date   ESRSEDRATE 38 (H) 03/29/2017   ESRSEDRATE 48 (H) 10/12/2016   ESRSEDRATE 13 11/26/2013          Assessment:

## 2017-03-29 NOTE — Patient Instructions (Addendum)
Continue augmentin twice daily x 5 more days then stop  Continue duonebs three times daily   Discontinue macrodantin indefinitely    Please remember to go to the lab and x-ray department downstairs in the basement  for your tests - we will call you with the results when they are available.  Pulmonary follow up is as needed  Add: will advise d/c lasix due to pre renal azotemia/ note did not go to xray but infiltrates were only seen on CT anyway so cxr optional / f/u remains prn here

## 2017-03-30 DIAGNOSIS — R7989 Other specified abnormal findings of blood chemistry: Secondary | ICD-10-CM | POA: Insufficient documentation

## 2017-03-30 DIAGNOSIS — R918 Other nonspecific abnormal finding of lung field: Secondary | ICD-10-CM | POA: Insufficient documentation

## 2017-03-30 NOTE — Assessment & Plan Note (Signed)
Lab Results  Component Value Date   CREATININE 1.19 (H) 03/26/2017   CREATININE 1.18 (H) 03/25/2017   CREATININE 1.43 (H) 03/24/2017   CREATININE 1.4 (H) 09/27/2016   CREATININE 2.1 (H) 12/30/2015   CREATININE 1.81 (H) 08/27/2015   CREATININE 1.75 (H) 08/19/2015   CREATININE 2.0 (H) 12/30/2014    BUn up to 61 so will advise stop diuretics/ follow for recurrent evidence of vol oveload before restarting

## 2017-03-30 NOTE — Assessment & Plan Note (Addendum)
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  The problem is one of high risk of over treating aspiration and creating resistant organisms including C diff and I'm sure that's why ID rec they be stopped and would welcome  A second eval by ID but no need for pulmonary f/u as an outpt for this problem.  She is now on day 7 of Rx and purely to keep her daughter from getting angry I agreed to 5 more days augmentin p discussed risk vs benefits with both pt and daughter.  In future for inpt issues of ? pna vs bland aspiration would use the procalcitonin protocol to sort out / help justify repeated courses of abx in this setting.  I would strongly advise not to use macrodantin in a pt with pulmonary infiltrates of unknown origin.   Total time devoted to counseling  > 50 % of initial 60 min office visit:  review case(including extensive inpt records)  with pt/daughter discussion of options/alternatives/ personally creating written customized instructions  in presence of pt  then going over those specific  Instructions directly with the pt including how to use all of the meds but in particular covering each new medication in detail and the difference between the maintenance= "automatic" meds and the prns using an action plan format for the latter (If this problem/symptom => do that organization reading Left to right).  Please see AVS from this visit for a full list  of these instructions which I personally wrote for this pt and  are unique to this visit.

## 2017-03-30 NOTE — Assessment & Plan Note (Signed)
She is a bit anemic but bnp, hc03 and tsh ok and not active enough to be limited by copd at this time so this issue was not further addressed by happy to see her again prn at PCP request

## 2017-03-30 NOTE — Progress Notes (Signed)
Spoke with pt's daughter, [name] and notified of results and she verbalized understanding and states will inform the pt

## 2017-04-04 ENCOUNTER — Other Ambulatory Visit (HOSPITAL_BASED_OUTPATIENT_CLINIC_OR_DEPARTMENT_OTHER): Payer: Medicare Other

## 2017-04-04 ENCOUNTER — Telehealth: Payer: Self-pay | Admitting: Hematology and Oncology

## 2017-04-04 ENCOUNTER — Ambulatory Visit (HOSPITAL_BASED_OUTPATIENT_CLINIC_OR_DEPARTMENT_OTHER): Payer: Medicare Other | Admitting: Hematology and Oncology

## 2017-04-04 VITALS — BP 154/60 | HR 81 | Temp 98.5°F | Resp 18 | Ht 63.0 in | Wt 135.5 lb

## 2017-04-04 DIAGNOSIS — N189 Chronic kidney disease, unspecified: Secondary | ICD-10-CM

## 2017-04-04 DIAGNOSIS — G894 Chronic pain syndrome: Secondary | ICD-10-CM | POA: Diagnosis not present

## 2017-04-04 DIAGNOSIS — D472 Monoclonal gammopathy: Secondary | ICD-10-CM | POA: Diagnosis not present

## 2017-04-04 DIAGNOSIS — D631 Anemia in chronic kidney disease: Secondary | ICD-10-CM | POA: Diagnosis not present

## 2017-04-04 DIAGNOSIS — D72829 Elevated white blood cell count, unspecified: Secondary | ICD-10-CM

## 2017-04-04 DIAGNOSIS — N182 Chronic kidney disease, stage 2 (mild): Secondary | ICD-10-CM

## 2017-04-04 DIAGNOSIS — N179 Acute kidney failure, unspecified: Secondary | ICD-10-CM

## 2017-04-04 LAB — CBC WITH DIFFERENTIAL/PLATELET
BASO%: 0.1 % (ref 0.0–2.0)
Basophils Absolute: 0 10*3/uL (ref 0.0–0.1)
EOS%: 0.7 % (ref 0.0–7.0)
Eosinophils Absolute: 0.1 10*3/uL (ref 0.0–0.5)
HEMATOCRIT: 29.7 % — AB (ref 34.8–46.6)
HGB: 10.1 g/dL — ABNORMAL LOW (ref 11.6–15.9)
LYMPH#: 1.2 10*3/uL (ref 0.9–3.3)
LYMPH%: 6.5 % — ABNORMAL LOW (ref 14.0–49.7)
MCH: 31.1 pg (ref 25.1–34.0)
MCHC: 34 g/dL (ref 31.5–36.0)
MCV: 91.4 fL (ref 79.5–101.0)
MONO#: 0.8 10*3/uL (ref 0.1–0.9)
MONO%: 4.6 % (ref 0.0–14.0)
NEUT%: 88.1 % — ABNORMAL HIGH (ref 38.4–76.8)
NEUTROS ABS: 15.6 10*3/uL — AB (ref 1.5–6.5)
PLATELETS: 258 10*3/uL (ref 145–400)
RBC: 3.25 10*6/uL — ABNORMAL LOW (ref 3.70–5.45)
RDW: 13.5 % (ref 11.2–14.5)
WBC: 17.7 10*3/uL — AB (ref 3.9–10.3)

## 2017-04-04 NOTE — Telephone Encounter (Signed)
Scheduled appt per 10/22 los - Gave patient AVS and calender per los.  

## 2017-04-05 ENCOUNTER — Encounter: Payer: Self-pay | Admitting: Hematology and Oncology

## 2017-04-05 NOTE — Assessment & Plan Note (Signed)
She does not need darbepoetin injection of blood transfusion Monitor closely

## 2017-04-05 NOTE — Assessment & Plan Note (Signed)
Clinically, she does not appear to have signs and symptoms to suggest multiple myeloma. 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 I would recommend rechecking her blood work and exam annually Repeat myeloma panel in April 2018 is stable

## 2017-04-05 NOTE — Assessment & Plan Note (Signed)
She has chronic pain disorder She has appointment to see pain specialist for chronic pain management.  I would defer to them for further management

## 2017-04-05 NOTE — Progress Notes (Signed)
[facility] OFFICE PROGRESS NOTE  Patient Care Team: [name], MD as PCP - General (Internal Medicine) Corliss Parish, MD as Consulting Physician (Nephrology) Debara Pickett Nadean Corwin, MD as Consulting Physician (Cardiology)  SUMMARY OF ONCOLOGIC HISTORY:  Rebecca [name] is here because of recently discovered IgG lambda MGUS. Patient herself appears to have some memory deficit. A lot of history was obtained through a review of chart and collaboration of history with her daughter She denies history of abnormal bone pain or bone fracture. The patient had history of polymyalgia rheumatica and had been on intermittent high-dose prednisone therapy in the past, currently on a slow prednisone taper. Patient denies history of recurrent infection or atypical infections such as shingles of meningitis. Denies chills, night sweats, anorexia or abnormal weight loss. The patient have numerous recurrent hospitalization for many reasons including recurrent aspiration pneumonia and others  INTERVAL HISTORY: Please see below for problem oriented charting. The patient is seen today with her daughter She is currently in a rehab facility She continues to complain of severe hip pain. She has appointment to see pain specialist for further management She denies recent fever or chills  REVIEW OF SYSTEMS:   Constitutional: Denies fevers, chills or abnormal weight loss Eyes: Denies blurriness of vision Ears, nose, mouth, throat, and face: Denies mucositis or sore throat Respiratory: Denies cough, dyspnea or wheezes Cardiovascular: Denies palpitation, chest discomfort or lower extremity swelling Gastrointestinal:  Denies nausea, heartburn or change in bowel habits Skin: Denies abnormal skin rashes Lymphatics: Denies new lymphadenopathy or easy bruising Neurological:Denies numbness, tingling or new weaknesses Behavioral/Psych: Mood is stable, no new changes  All other systems were reviewed  with the patient and are negative.  I have reviewed the past medical history, past surgical history, social history and family history with the patient and they are unchanged from previous note.  ALLERGIES:  is allergic to levaquin [levofloxacin] and tape.  MEDICATIONS:  Current Outpatient Prescriptions  Medication Sig Dispense Refill  . Liniments (SALONPAS PAIN RELIEF PATCH EX) Apply topically daily. Apply one to lower back and one to hip each am, remove after 8 hours    . acetaminophen (TYLENOL) 500 MG tablet Take 1 tablet (500 mg total) by mouth every 6 (six) hours as needed (pain). (Patient taking differently: Take 1,000 mg by mouth 2 (two) times daily as needed for moderate pain. ) 30 tablet 0  . amoxicillin-clavulanate (AUGMENTIN) 875-125 MG tablet Take 1 tablet by mouth every 12 (twelve) hours. 3 more days 10 tablet 0  . cetirizine (ZYRTEC) 10 MG tablet Take 10 mg by mouth at bedtime.     . clopidogrel (PLAVIX) 75 MG tablet Take 1 tablet (75 mg total) by mouth daily with breakfast. (Patient taking differently: Take 75 mg by mouth at bedtime. ) 30 tablet 0  . cloZAPine (CLOZARIL) 100 MG tablet Take 300 mg by mouth at bedtime.  5  . Cranberry (SM CRANBERRY) 300 MG tablet Take 300 mg by mouth daily.    Marland Kitchen diltiazem (CARDIZEM LA) 180 MG 24 hr tablet Take 180 mg by mouth daily with breakfast.  0  . Fluticasone-Salmeterol (ADVAIR) 250-50 MCG/DOSE AEPB Inhale 1 puff into the lungs at bedtime.    . furosemide (LASIX) 20 MG tablet Take 1 tablet (20 mg total) by mouth daily as needed. (Patient taking differently: Take 20 mg by mouth daily as needed for fluid or edema. ) 30 tablet 3  . GUAIFENESIN PO Take 400 mg by mouth 3 (three) times  daily.     Marland Kitchen HYDROmorphone (DILAUDID) 2 MG tablet Take 1 tablet (2 mg total) by mouth every 6 (six) hours as needed for severe pain. 10 tablet 0  . ipratropium-albuterol (DUONEB) 0.5-2.5 (3) MG/3ML SOLN Inhale 3 mLs into the lungs 3 (three) times daily.   3  .  isosorbide mononitrate (IMDUR) 60 MG 24 hr tablet Take 1 tablet (60 mg total) by mouth daily. 30 tablet 6  . levothyroxine (SYNTHROID, LEVOTHROID) 75 MCG tablet Take 75 mcg by mouth daily before breakfast.    . metoprolol succinate (TOPROL-XL) 100 MG 24 hr tablet Take 100 mg by mouth daily with breakfast. Take with or immediately following a meal.    . nitroGLYCERIN (NITROSTAT) 0.4 MG SL tablet Place 1 tablet (0.4 mg total) under the tongue every 5 (five) minutes as needed. For chest pain 25 tablet 3  . ondansetron (ZOFRAN) 4 MG tablet Take 1 tablet (4 mg total) by mouth every 8 (eight) hours as needed for nausea or vomiting. 18 tablet 0  . pantoprazole (PROTONIX) 40 MG tablet Take 1 tablet (40 mg total) by mouth daily. 30 tablet 11  . polyethylene glycol powder (GLYCOLAX/MIRALAX) powder Take 17 g by mouth 2 (two) times daily. [place]    . pravastatin (PRAVACHOL) 20 MG tablet Take 20 mg by mouth at bedtime.    . predniSONE (DELTASONE) 10 MG tablet Take 1 tablet (10 mg total) by mouth daily with breakfast. 30 tablet 0  . Probiotic Product (PROBIOTIC PO) Take 1 capsule by mouth daily.    Marland Kitchen senna (SENOKOT) 8.6 MG TABS tablet Take 2 tablets (17.2 mg total) by mouth at bedtime.    . tamsulosin (FLOMAX) 0.4 MG CAPS capsule Take 1 capsule (0.4 mg total) by mouth daily. 30 capsule   . Tiotropium Bromide Monohydrate (SPIRIVA RESPIMAT) 2.5 MCG/ACT AERS Inhale 2 puffs into the lungs every morning.     . Vitamin D, Ergocalciferol, (DRISDOL) 50000 UNITS CAPS capsule Take 1 capsule (50,000 Units total) by mouth every 7 (seven) days. 4 capsule 0   No current facility-administered medications for this visit.     PHYSICAL EXAMINATION: ECOG PERFORMANCE STATUS: 2 - Symptomatic, <50% confined to bed  Vitals:   04/04/17 1437  BP: (!) 154/60  Pulse: 81  Resp: 18  Temp: 98.5 F (36.9 C)  SpO2: 100%   Filed Weights   04/04/17 1437  Weight: 135 lb 8 oz (61.5 kg)    GENERAL:alert, no distress and  comfortable.  She looks cushingoid.  She has signs of dementia SKIN: skin color, texture, turgor are normal, no rashes or significant lesions EYES: normal, Conjunctiva are pink and non-injected, sclera clear OROPHARYNX:no exudate, no erythema and lips, buccal mucosa, and tongue normal  NECK: supple, thyroid normal size, non-tender, without nodularity LYMPH:  no palpable lymphadenopathy in the cervical, axillary or inguinal LUNGS: clear to auscultation and percussion with normal breathing effort HEART: regular rate & rhythm and no murmurs and no lower extremity edema ABDOMEN:abdomen soft, non-tender and normal bowel sounds Musculoskeletal:no cyanosis of digits and no clubbing  NEURO: alert & oriented x 3 with fluent speech, no focal motor/sensory deficits  LABORATORY DATA:  I have reviewed the data as listed    Component Value Date/Time   NA 134 (L) 03/26/2017 0401   NA 135 01/11/2017 1250   NA 141 09/27/2016 1047   K 5.1 03/26/2017 0401   K 4.8 09/27/2016 1047   CL 98 (L) 03/26/2017 0401   CO2  26 03/26/2017 0401   CO2 24 09/27/2016 1047   GLUCOSE 143 (H) 03/26/2017 0401   GLUCOSE 148 (H) 09/27/2016 1047   BUN 61 (H) 03/26/2017 0401   BUN 37 (H) 01/11/2017 1250   BUN 25.6 09/27/2016 1047   CREATININE 1.19 (H) 03/26/2017 0401   CREATININE 1.4 (H) 09/27/2016 1047   CALCIUM 8.5 (L) 03/26/2017 0401   CALCIUM 9.4 09/27/2016 1047   PROT 6.2 (L) 03/25/2017 0348   PROT 5.9 (L) 09/27/2016 1047   PROT 6.4 09/27/2016 1047   ALBUMIN 2.9 (L) 03/25/2017 0348   ALBUMIN 3.1 (L) 09/27/2016 1047   AST 29 03/25/2017 0348   AST 14 09/27/2016 1047   ALT 20 03/25/2017 0348   ALT 16 09/27/2016 1047   ALKPHOS 48 03/25/2017 0348   ALKPHOS 42 09/27/2016 1047   BILITOT 0.5 03/25/2017 0348   BILITOT 0.33 09/27/2016 1047   GFRNONAA 43 (L) 03/26/2017 0401   GFRAA 49 (L) 03/26/2017 0401    No results found for: SPEP, UPEP  Lab Results  Component Value Date   WBC 17.7 (H) 04/04/2017   NEUTROABS  15.6 (H) 04/04/2017   HGB 10.1 (L) 04/04/2017   HCT 29.7 (L) 04/04/2017   MCV 91.4 04/04/2017   PLT 258 04/04/2017      Chemistry      Component Value Date/Time   NA 134 (L) 03/26/2017 0401   NA 135 01/11/2017 1250   NA 141 09/27/2016 1047   K 5.1 03/26/2017 0401   K 4.8 09/27/2016 1047   CL 98 (L) 03/26/2017 0401   CO2 26 03/26/2017 0401   CO2 24 09/27/2016 1047   BUN 61 (H) 03/26/2017 0401   BUN 37 (H) 01/11/2017 1250   BUN 25.6 09/27/2016 1047   CREATININE 1.19 (H) 03/26/2017 0401   CREATININE 1.4 (H) 09/27/2016 1047   GLU 79 09/20/2016      Component Value Date/Time   CALCIUM 8.5 (L) 03/26/2017 0401   CALCIUM 9.4 09/27/2016 1047   ALKPHOS 48 03/25/2017 0348   ALKPHOS 42 09/27/2016 1047   AST 29 03/25/2017 0348   AST 14 09/27/2016 1047   ALT 20 03/25/2017 0348   ALT 16 09/27/2016 1047   BILITOT 0.5 03/25/2017 0348   BILITOT 0.33 09/27/2016 1047       RADIOGRAPHIC STUDIES: I have personally reviewed the radiological images as listed and agreed with the findings in the report. Dg Chest 2 View  Result Date: 03/20/2017 CLINICAL DATA:  76 y/o F; weakness and cough. History of CAD, COPD, pneumonia, diabetes, and stroke. History of cardiac cath in 2017. Former smoker. EXAM: CHEST  2 VIEW COMPARISON:  03/14/2017, 09/13/2016, 08/10/2015 chest radiograph. 01/26/2017 CT chest. FINDINGS: Stable normal cardiac silhouette given projection and technique. Aortic atherosclerosis with calcification. Improved aeration of the left lung base with mild residual streaky opacity. No new consolidation, effusion, or pneumothorax. Degenerative changes of bilateral glenohumeral joints and the thoracic spine. No acute osseous abnormality is evident. IMPRESSION: 1. Improved aeration of the left lung base with mild residual streaky opacity similar to prior radiographs, probably scarring and atelectasis. No new focal consolidation. 2. Aortic atherosclerosis. Electronically Signed   By: Kristine Garbe M.D.   On: 03/20/2017 16:43   Dg Chest 2 View  Result Date: 03/14/2017 CLINICAL DATA:  Worsening hip pain. EXAM: CHEST  2 VIEW COMPARISON:  01/25/2017 FINDINGS: Artifact overlies the chest. The right lung is clear. There is some chronic scarring at the left base, but this may be  more pronounced, suggesting some left base atelectasis or infiltrate. No measurable effusion. No acute bone finding. IMPRESSION: Chronic scarring at the left base, but possibly with some slightly worsened atelectasis or infiltrate. Electronically Signed   By: Nelson Chimes M.D.   On: 03/14/2017 14:39   Dg Arthro Hip Right  Result Date: 03/24/2017 INDICATION: Right hip pain.  Right hip arthropathy. EXAM: THERAPEUTIC INJECTION OF RIGHT HIP JOINT UNDER FLUOROSCOPIC GUIDANCE COMPARISON:  CT of the right hip from 03/15/2017 FLUOROSCOPY TIME:  Fluoroscopy Time:  0 minutes, 14 seconds Radiation Exposure Index (if provided by the fluoroscopic device): 1.6 mGy Number of Acquired Spot Images: 0 COMPLICATIONS: None immediate. PROCEDURE: I discussed the risks (including hemorrhage, infection, and allergic reaction, among others), benefits, and alternatives to the procedure with the patient's daughter, who is currently making decisions for the patient due to altered mental status. We specifically discussed the high technical likelihood of success of the procedure. We discussed the elevated risk of bleeding complications due to a recent dose of Lovenox, although I still feel that the procedure is very safe given the planned needle size. The patient's daughter understood and elected for the patient to undergo the procedure. Standard time-out was employed. Following sterile skin prep and local anesthetic administration consisting of 1% lidocaine, a 22 gauge needle was advanced without difficulty into the right hip joint under fluoroscopic guidance. 6 cc of bupivacaine mixed with 80 mg of Depo-Medrol was injected into the joint  without difficulty. The needle was subsequently removed and the skin cleansed and bandaged. No immediate complications were observed. IMPRESSION: Successful fluoroscopic guided therapeutic injection of the right hip. Electronically Signed   By: Van Clines M.D.   On: 03/24/2017 16:03   Ct Head Wo Contrast  Result Date: 03/21/2017 CLINICAL DATA:  76 year old female with increasing drowsiness and lethargy. EXAM: CT HEAD WITHOUT CONTRAST TECHNIQUE: Contiguous axial images were obtained from the base of the skull through the vertex without intravenous contrast. COMPARISON:  Head CT 01/25/2017. FINDINGS: Brain: Patchy and confluent areas of decreased attenuation are noted throughout the deep and periventricular white matter of the cerebral hemispheres bilaterally, compatible with chronic microvascular ischemic disease. Well-defined areas of low attenuation lesions in the basal ganglia bilaterally, compatible with old lacunar infarcts. No evidence of acute infarction, hemorrhage, hydrocephalus, extra-axial collection or mass lesion/mass effect. Vascular: No hyperdense vessel or unexpected calcification. Skull: Normal. Negative for fracture or focal lesion. Sinuses/Orbits: Small left mastoid effusion is unchanged. No acute finding. Other: None. IMPRESSION: 1. No acute intracranial abnormalities. 2. 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 3. Small left mastoid effusion is unchanged. Electronically Signed   By: Vinnie Langton M.D.   On: 03/21/2017 15:51   Ct Soft Tissue Neck Wo Contrast  Result Date: 03/24/2017 CLINICAL DATA:  76 y/o female with palpable mass right tonsillar area. Afebrile EXAM: CT NECK WITHOUT CONTRAST TECHNIQUE: Multidetector CT imaging of the neck was performed following the standard protocol without intravenous contrast. COMPARISON:  Head CT 03/21/2017. Brain MRI  01/25/2017. Cervical spine CT 10/19/2016. Chest CT 03/21/2017. FINDINGS: Pharynx and larynx: Negative larynx. Mild motion artifact at the hypopharynx, but pharyngeal soft tissue contours appear stable since May and within normal limits. Negative parapharyngeal and retropharyngeal spaces. Salivary glands: Negative noncontrast sublingual space, submandibular and parotid glands. Thyroid: Diminutive, negative. Lymph nodes: Negative.  No cervical lymphadenopathy. Vascular: Vascular patency is not evaluated in the absence of  IV contrast. Calcified aortic atherosclerosis. Mild calcified carotid bifurcation atherosclerosis. Mild Calcified atherosclerosis at the skull base. Limited intracranial: Stable and negative. Visualized orbits: Negative. Mastoids and visualized paranasal sinuses: Stable mild left mastoid effusion. Other visible paranasal sinuses and mastoids are stable and well pneumatized. Skeleton: Absent dentition. Chronic cervical spine degeneration. No acute osseous abnormality identified. Upper chest: Partially visible patchy peribronchial opacity in the right lung as seen on the 03/21/2017 chest CT. Stable lung apices. Stable visualized superior mediastinum. IMPRESSION: 1. Negative for neck mass or lymphadenopathy. Negative noncontrast CT appearance of the neck soft tissues. 2. Continued patchy peribronchial opacity in the right upper lobe as seen on the Chest CT 03/21/2017. 3.  Aortic Atherosclerosis (ICD10-I70.0). Electronically Signed   By: Genevie Ann M.D.   On: 03/24/2017 14:24   Ct Chest Wo Contrast  Result Date: 03/21/2017 CLINICAL DATA:  COPD.  Possible pneumonia. EXAM: CT CHEST WITHOUT CONTRAST TECHNIQUE: Multidetector CT imaging of the chest was performed following the standard protocol without IV contrast. COMPARISON:  Chest radiograph 03/20/2017 FINDINGS: Cardiovascular: Normal heart size. No pericardial effusion. Ectasia and calcific atherosclerotic disease of the thoracic aorta. Irregularity of  the contour of the aorta at the thoracic arch, with maximum diameter of 2.7 cm. Crescent-shaped calcifications along the lateral border the proximal abdominal aorta, image 137/152, sequence 3. Mediastinum/Nodes: No enlarged mediastinal or axillary lymph nodes. Thyroid gland, and trachea demonstrate no significant findings. Debris within the dependent portion of the esophagus. Lungs/Pleura: Patchy areas of airspace consolidation in the right greater than left lower lobe in dependent distribution. Smaller areas of airspace consolidation extend into the bilateral upper lobes. Upper Abdomen: No acute abnormality. Musculoskeletal: Apparent interruption of the right proximal humeral cortex may be artifactual. Multilevel osteoarthritic changes of the thoracic spine. IMPRESSION: Ectasia and calcific atherosclerotic disease of the visualized aorta. Irregularity of the aortic contour at the thoracic arch, and intraluminal calcifications at the thoracic arch and at the level of the proximal abdominal aorta may represent rim calcified atherosclerotic plaque versus dissection of the aorta, incompletely evaluated due to lack of IV contrast. Bilateral lower lobe predominant pneumonia with dependent distribution. Query aspiration pneumonia, especially given debris within the esophagus. Apparent interruption of the cortex of the right proximal humerus, which may be due to motion artifact rather than representing a true fracture. Please correlate to possible point of tenderness. These results will be called to the ordering clinician or representative by the Radiologist Assistant, and communication documented in the PACS or zVision Dashboard. Electronically Signed   By: Fidela Salisbury M.D.   On: 03/21/2017 19:07   Ct Hip Right Wo Contrast  Result Date: 03/15/2017 CLINICAL DATA:  Worsening right hip pain after recent fall. EXAM: CT OF THE RIGHT HIP WITHOUT CONTRAST TECHNIQUE: Multidetector CT imaging of the right hip was  performed according to the standard protocol. Multiplanar CT image reconstructions were also generated. COMPARISON:  Right hip x-rays from yesterday. CT abdomen pelvis dated January 16, 2017. FINDINGS: Bones/Joint/Cartilage No acute fracture or malalignment. No joint effusion. Degenerative changes of the right hip, pubic symphysis, and right sacroiliac joint. Subchondral cystic change in the right acetabulum. Questionable chondrocalcinosis in the right hip joint. Osteopenia. Ligaments Suboptimally assessed by CT. Muscles and Tendons Dystrophic calcifications at the right hamstring origins, consistent with prior injury. No significant muscle atrophy. Soft tissues Sigmoid diverticulosis.  No acute intrapelvic abnormality. IMPRESSION: 1.  No acute osseous abnormality. 2. Mild degenerative changes of the right hip joint with questionable chondrocalcinosis, which could be  age related or reflect CPPD arthropathy. Electronically Signed   By: Titus Dubin M.D.   On: 03/15/2017 11:32   US Abdomen Limited  Result Date: 03/15/2017 CLINICAL DATA:  Possible ascites requiring paracentesis. EXAM: LIMITED ABDOMEN ULTRASOUND FOR ASCITES TECHNIQUE: Limited ultrasound survey for ascites was performed in all four abdominal quadrants. COMPARISON:  None. FINDINGS: Survey of the peritoneal cavity shows no ascites. Paracentesis was therefore not performed. IMPRESSION: No ascites identified in the peritoneal cavity. Electronically Signed   By: Aletta Edouard M.D.   On: 03/15/2017 15:30   Dg Hip Unilat W Or Wo Pelvis 2-3 Views Right  Result Date: 03/14/2017 CLINICAL DATA:  Recent fall. Worsening right hip pain for 1 week. Initial encounter. EXAM: DG HIP (WITH OR WITHOUT PELVIS) 2-3V RIGHT COMPARISON:  None. FINDINGS: There is no evidence of hip fracture or dislocation. There is no evidence of hip joint arthropathy or other focal bone abnormality. Mild degenerative changes are seen involving the pubic symphysis and visualized lower  lumbar spine. IMPRESSION: No acute findings. Mild degenerative changes involving pubic symphysis and lower lumbar spine. Electronically Signed   By: Earle Gell M.D.   On: 03/14/2017 14:41    ASSESSMENT & PLAN:  MGUS (monoclonal gammopathy of unknown significance) Clinically, she does not appear to have signs and symptoms to suggest multiple myeloma. 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  I would defer to them for further management   Orders Placed This Encounter  Procedures  . Comprehensive metabolic panel    Standing Status:   Future    Standing Expiration Date:   05/10/2018  . Kappa/lambda light chains    Standing Status:   Future    Standing Expiration Date:   05/10/2018  . Multiple Myeloma Panel (SPEP&IFE w/QIG)    Standing Status:   Future    Standing Expiration Date:   05/10/2018   All questions were answered. The patient knows to call the clinic with any problems, questions or concerns. No barriers to learning was detected. I spent 15 minutes counseling the patient face to face. The total time spent in the appointment was 20 minutes and more than 50% was on counseling and review of test results     Heath Lark, MD 04/05/2017 5:03 PM

## 2017-04-05 NOTE — Assessment & Plan Note (Signed)
She have chronic leukocytosis due to prednisone therapy Continue close monitoring

## 2017-04-06 DIAGNOSIS — M5416 Radiculopathy, lumbar region: Secondary | ICD-10-CM | POA: Diagnosis not present

## 2017-04-06 DIAGNOSIS — M25551 Pain in right hip: Secondary | ICD-10-CM | POA: Diagnosis not present

## 2017-04-11 ENCOUNTER — Ambulatory Visit: Payer: Medicare Other | Admitting: Endocrinology

## 2017-04-11 DIAGNOSIS — Z0289 Encounter for other administrative examinations: Secondary | ICD-10-CM

## 2017-04-12 ENCOUNTER — Other Ambulatory Visit: Payer: Self-pay | Admitting: *Deleted

## 2017-04-12 DIAGNOSIS — J441 Chronic obstructive pulmonary disease with (acute) exacerbation: Secondary | ICD-10-CM

## 2017-04-12 DIAGNOSIS — J189 Pneumonia, unspecified organism: Secondary | ICD-10-CM | POA: Diagnosis not present

## 2017-04-12 DIAGNOSIS — M48061 Spinal stenosis, lumbar region without neurogenic claudication: Secondary | ICD-10-CM | POA: Diagnosis not present

## 2017-04-12 DIAGNOSIS — D72829 Elevated white blood cell count, unspecified: Secondary | ICD-10-CM | POA: Diagnosis not present

## 2017-04-12 DIAGNOSIS — R7989 Other specified abnormal findings of blood chemistry: Secondary | ICD-10-CM | POA: Diagnosis not present

## 2017-04-12 DIAGNOSIS — I10 Essential (primary) hypertension: Secondary | ICD-10-CM | POA: Diagnosis not present

## 2017-04-12 NOTE — Patient Outreach (Signed)
Call to patient daughter Yari Szeliga, who is HCPOA and contact for patient. Daughter reports patient to be discharged 04/14/17, but daughter disagrees and has a call into appeal the decision.  She would like patient to stay at facility until 11/20 or so after her appointment for an injection by pain doctor. She reports patient has rheumatology appointment 10/31  Daughter states care has been adequate at facility except for the quick discharge.  Daughter states she was told by SW at facility that patient only had 20 days.  Patient does attend a respite care program at [facility] M-F 10-2. She reports she hopes patient can get back to that but will need a note from the MD okaying her to return.   RNCM discussed definition around skilled days and custodial care with patient daughter  RNCM discsussed [facility] program services Daughter agrees, she feels she could benefit from a LCSW and RNCM for care coordination  RNCM will leave packet in patient room as daughter will not be at facility until later today. Verbal consent obtained. RNCM will refer to [facility] LCSW and [facility] RNCM   Mary E. Laymond Purser, RN, BSN, [place] [phone]) Business Cell  [phone]) Toll Free Office

## 2017-04-13 ENCOUNTER — Other Ambulatory Visit: Payer: Self-pay | Admitting: Licensed Clinical Social Worker

## 2017-04-13 ENCOUNTER — Other Ambulatory Visit: Payer: Self-pay | Admitting: *Deleted

## 2017-04-13 DIAGNOSIS — D472 Monoclonal gammopathy: Secondary | ICD-10-CM | POA: Diagnosis not present

## 2017-04-13 DIAGNOSIS — M353 Polymyalgia rheumatica: Secondary | ICD-10-CM | POA: Diagnosis not present

## 2017-04-13 DIAGNOSIS — J690 Pneumonitis due to inhalation of food and vomit: Secondary | ICD-10-CM | POA: Diagnosis not present

## 2017-04-13 DIAGNOSIS — M25559 Pain in unspecified hip: Secondary | ICD-10-CM | POA: Diagnosis not present

## 2017-04-13 DIAGNOSIS — M7061 Trochanteric bursitis, right hip: Secondary | ICD-10-CM | POA: Diagnosis not present

## 2017-04-13 DIAGNOSIS — M810 Age-related osteoporosis without current pathological fracture: Secondary | ICD-10-CM | POA: Diagnosis not present

## 2017-04-13 DIAGNOSIS — N183 Chronic kidney disease, stage 3 (moderate): Secondary | ICD-10-CM | POA: Diagnosis not present

## 2017-04-13 DIAGNOSIS — Z7952 Long term (current) use of systemic steroids: Secondary | ICD-10-CM | POA: Diagnosis not present

## 2017-04-13 DIAGNOSIS — M150 Primary generalized (osteo)arthritis: Secondary | ICD-10-CM | POA: Diagnosis not present

## 2017-04-13 DIAGNOSIS — M7989 Other specified soft tissue disorders: Secondary | ICD-10-CM | POA: Diagnosis not present

## 2017-04-13 DIAGNOSIS — M1120 Other chondrocalcinosis, unspecified site: Secondary | ICD-10-CM | POA: Diagnosis not present

## 2017-04-13 NOTE — Patient Outreach (Signed)
[facility]) Care Management  04/13/2017  [name] Dec 01, 1940 [id number]   Call from patient daughter, [name].  She plans to bring patient home tomorrow.  She lost first appeal and will file a second appeal.  She was told she would have to pay $600 a day if patient stayed private pay.  She is very concerned about patient as MD wanted patient to stay after starting new medication for pseudgout and now  Patient will be coming home.   RNCM told her she could speak with the ombudsman with any issues with facility she has already spoken with Medicare about her concerns. RNCM will let Care team know of discharge plan for 04/14/17    [facility] E. Laymond Purser, RN, BSN, [place] [facility] Care Coordinator [phone]

## 2017-04-13 NOTE — Patient Outreach (Addendum)
  [facility]) Care Management  04/13/2017  Rebecca [name] 01-Jan-1941 [id number]  Assessment- CSW completed call to [facility], [place] and requested to speak to the SNF discharge planner in order to gain updates on patient and her expected discharge. CSW spoke to [place], [place] discharge planner. CSW was informed that patient will be leaving tomorrow. Family initially did not want to file an appeal but facility received a call yesterday evening that family wished to appeal. However, due to the late notice, SNF discharge planner shares that this appeal will not be able to be processed. Patient will be discharging home with [place]. Patient will receive: PT, OT, ST, CNA and SW. No medical equipment was ordered as patient had all that she needed.  CSW completed call to patient's daughter and HCPOA but was unable to reach her successfully. CSW was also unable to leave a voice message as the mail box was full.  Plan-CSW will send involvement letter to PCP. CSW will await for SNF discharge and complete additional outreach attempt.  [name], BSW, MSW, [email or web address] Phone: [phone] Fax: [phone]

## 2017-04-15 ENCOUNTER — Other Ambulatory Visit: Payer: Self-pay

## 2017-04-15 NOTE — Patient Outreach (Signed)
[facility]) Care Management  04/15/2017  Rebecca [name] 1940/08/29 [id number]   Subjective: client reports being in pain  Objective: none  Assessment: 76 year old with recent transition home from [facility] facility on 04/14/17. RNCM called for transition of care. Spoke with client who reports she her right hip is hurting. She reports that she took pain medication approximate 30 minutes prior to conversation. Rebecca [name] referred the remainder of the transition of care call to her daughter, Rebecca [name].   Medications reviewed. Daughter has all medication except one and has spoken with the facility to have a prescription sent to pharmacy. She has also spoken with home health who will be seeing client tomorrow.  Daughter reports overwhelmed next week with home health visits in addition to primary care appointment and request transition of care call next week and home visit the following week.  RNCM provided contact number  RNCM confirmed 24hour nurse line and encouraged to call as needed.  Plan: transition of care call next week.  Thea Silversmith, RN, MSN, [place] Coordinator Cell: [phone]

## 2017-04-18 ENCOUNTER — Other Ambulatory Visit: Payer: Self-pay | Admitting: Licensed Clinical Social Worker

## 2017-04-18 NOTE — Patient Outreach (Addendum)
[facility] [facility]) Care Management  04/18/2017  Rebecca [name] December 28, 1940 [id number]  Assessment- CSW received incoming return call from patient's daughter. HIPPA verifications were provided. CSW introduced self, reason for call and of [facility] social work services. Patient's daughter reports that she is very appreciative of phone call but only needs nursing services at this time. Daughter reports that she is very overwhelmed with the amount of calls and visits from care providers on top on provide care to patient. Daughter reports that she is very exhausted from providing care to patient but is hopeful that this will only be short term. She reports that her daughter and spouse at times can assist her with caregiving but have not been able to do so lately due to their jobs and schedule. She reports that her main concern is with the skilled nursing facility that patient was at before discharging home. She lost the second appeal and will be filing another appeal within 5 days. She has been in contact with her medicare navigator who has been assisting her with completing these appeals. CSW provided reflective listening throughout call to daughter and offered support. Daughter reports that she is unsure if family will need an aide to provide care to patient. She is hopeful that they will not need one. Family is not interested in hiring a private pay caregiver and fill that they would be able to appropriately take care of patient without having any personal care involvement. CSW questioned if she was aware that patient can gain an aide through her Medicaid. Daughter denies wanting this at this time but wishes for CSW to mail form that must be completed by PCP to her in the case that they wish to pursue an aide through [facility]. CSW also educated daughter on the PACE program. Daughter is also agreeable to CSW mailing out personal care resources as well. Daughter again refuses social work involvement  at this time but was strongly encouraged to contact CSW if she changes her mind.  Plan-CSW will not open case at this time.  [name], BSW, MSW, [email or web address] Phone: [phone] Fax: [phone]

## 2017-04-18 NOTE — Patient Outreach (Signed)
[place] [facility] Care Management  04/18/2017  Rebecca [name] 09-Aug-1940 [id number]  Assessment- CSW completed outreach call to patient's daughter's home and mobile number but was unable to reach her or leave a voice message as mailboxes were both full. CSW unable to leave a voice message. CSW will attempt third outreach attempt within one week.  Plan-CSW will complete third outreach attempt within one week. RNCM was able to establish contact with family.   Rebecca [name], BSW, MSW, [name] Phone: [phone] Fax: [phone]

## 2017-04-19 ENCOUNTER — Other Ambulatory Visit: Payer: Self-pay

## 2017-04-19 NOTE — Patient Outreach (Signed)
Request received from Brooke Joyce, LCSW to mail patient personal care resources.  Information mailed today. 

## 2017-04-19 NOTE — Patient Outreach (Addendum)
[facility]) Care Management   04/19/2017  Rebecca [name] 04/09/1941 [id number]  Rebecca [name] is an 76 y.o. female  Subjective: client reports continued pain to right hip.  Objective:  BP 140/68   Pulse 80   Resp 20   Ht 1.6 m (5\' 3" )   Wt 132 lb (59.9 kg) Comment: patient reported, last weight at facilty  SpO2 97%   BMI 23.38 kg/m   Review of Systems  Respiratory:       Lungs clear  Cardiovascular:       Heart rate regular  Skin:       Large bruise to left arm/hand. DuoDerm intact to right hand. Bruising noted to right hand/arm.    Physical Exam color good, respirations even/unlabored.  Encounter Medications:   Outpatient Encounter Medications as of 04/19/2017  Medication Sig Note  . acetaminophen (TYLENOL) 500 MG tablet Take 1 tablet (500 mg total) by mouth every 6 (six) hours as needed (pain). (Patient taking differently: Take 1,000 mg by mouth 2 (two) times daily as needed for moderate pain. )   . aspirin EC 81 MG tablet Take 81 mg as needed by mouth. 04/19/2017: Reports only takes with severe head pain.  . cefUROXime (CEFTIN) 500 MG tablet Take 500 mg 2 (two) times daily with a meal by mouth.   . cetirizine (ZYRTEC) 10 MG tablet Take 10 mg by mouth at bedtime.    . clopidogrel (PLAVIX) 75 MG tablet Take 1 tablet (75 mg total) by mouth daily with breakfast. (Patient taking differently: Take 75 mg by mouth at bedtime. )   . cloZAPine (CLOZARIL) 100 MG tablet Take 300 mg by mouth at bedtime.   . colchicine 0.6 MG tablet Take 0.6 mg 2 (two) times daily by mouth.   . Cranberry (SM CRANBERRY) 300 MG tablet Take 300 mg by mouth daily.   Marland Kitchen diltiazem (CARDIZEM CD) 240 MG 24 hr capsule Take 240 mg daily by mouth.   . Fluticasone-Salmeterol (ADVAIR) 250-50 MCG/DOSE AEPB Inhale 1 puff into the lungs at bedtime.   . GUAIFENESIN PO Take 400 mg by mouth 3 (three) times daily.  01/25/2017: Daughter unsure of strength  . HYDROmorphone (DILAUDID) 2 MG tablet Take 1 tablet  (2 mg total) by mouth every 6 (six) hours as needed for severe pain.   Marland Kitchen ipratropium-albuterol (DUONEB) 0.5-2.5 (3) MG/3ML SOLN Inhale 3 mLs into the lungs 3 (three) times daily.    . isosorbide mononitrate (IMDUR) 60 MG 24 hr tablet Take 1 tablet (60 mg total) by mouth daily.   Marland Kitchen levothyroxine (SYNTHROID, LEVOTHROID) 75 MCG tablet Take 75 mcg by mouth daily before breakfast.   . Liniments (SALONPAS PAIN RELIEF PATCH EX) Apply topically daily. Apply one to lower back and one to hip each am, remove after 8 hours   . metoprolol succinate (TOPROL-XL) 100 MG 24 hr tablet Take 100 mg by mouth daily with breakfast. Take with or immediately following a meal.   . nitroGLYCERIN (NITROSTAT) 0.4 MG SL tablet Place 1 tablet (0.4 mg total) under the tongue every 5 (five) minutes as needed. For chest pain   . ondansetron (ZOFRAN) 4 MG tablet Take 1 tablet (4 mg total) by mouth every 8 (eight) hours as needed for nausea or vomiting.   . pantoprazole (PROTONIX) 40 MG tablet Take 1 tablet (40 mg total) by mouth daily.   . polyethylene glycol powder (GLYCOLAX/MIRALAX) powder Take 17 g by mouth 2 (two) times daily. [place]   .  pravastatin (PRAVACHOL) 20 MG tablet Take 20 mg by mouth at bedtime.   . predniSONE (DELTASONE) 10 MG tablet Take 1 tablet (10 mg total) by mouth daily with breakfast.   . Probiotic Product (PROBIOTIC PO) Take 1 capsule by mouth daily.   Marland Kitchen senna (SENOKOT) 8.6 MG TABS tablet Take 2 tablets (17.2 mg total) by mouth at bedtime.   . Tiotropium Bromide Monohydrate (SPIRIVA RESPIMAT) 2.5 MCG/ACT AERS Inhale 2 puffs into the lungs every morning.    . Vitamin D, Ergocalciferol, (DRISDOL) 50000 UNITS CAPS capsule Take 1 capsule (50,000 Units total) by mouth every 7 (seven) days. 03/14/2017: Vitamin D due today  . amoxicillin-clavulanate (AUGMENTIN) 875-125 MG tablet Take 1 tablet by mouth every 12 (twelve) hours. 3 more days (Patient not taking: Reported on 04/15/2017)   . diltiazem (CARDIZEM LA) 180  MG 24 hr tablet Take 180 mg by mouth daily with breakfast. 04/19/2017: Reports increased to 240MG XT one capsule every morning   . furosemide (LASIX) 20 MG tablet Take 1 tablet (20 mg total) by mouth daily as needed. (Patient taking differently: Take 20 mg by mouth daily as needed for fluid or edema. ) 04/19/2017: Furosemide 20mg  daily as needed.  . tamsulosin (FLOMAX) 0.4 MG CAPS capsule Take 1 capsule (0.4 mg total) by mouth daily. (Patient not taking: Reported on 04/19/2017)    No facility-administered encounter medications on file as of 04/19/2017.     Functional Status:   In your present state of health, do you have any difficulty performing the following activities: 04/19/2017 03/14/2017  Hearing? N Y  [place]? N N  Difficulty concentrating or making decisions? N Y  Walking or climbing stairs? Y Y  Comment - -  Dressing or bathing? Y Y  Doing errands, shopping? [place]  Preparing Food and eating ? Y -  Using the Toilet? Y -  In the past six months, have you accidently leaked urine? Y -  Do you have problems with loss of bowel control? Y -  Managing your Medications? Y -  Managing your Finances? N -  Housekeeping or managing your Housekeeping? N -  Some recent data might be hidden    Fall/Depression Screening:    Fall Risk  04/19/2017 06/28/2013 06/28/2013  Falls in the past year? No Yes Yes  Number falls in past yr: - 2 or more 2 or more  Risk Factor Category  - - High Fall Risk  Risk for fall due to : Impaired mobility;Impaired balance/gait History of fall(s) -   PHQ 2/9 Scores 04/19/2017 10/19/2012 10/03/2012 04/21/2012 02/28/2012 10/26/2011 09/20/2011  PHQ - 2 Score 0 0 0 0 0 0 0    Assessment:   76 year old with recent transition home from [facility] facility on 04/14/17. Recent admission 10/1-10/3 due to right hip pain-trochanteric bursitis/spondylosis; acute on chronic heart failure; metabolic encephalopathy; HCAP. History of COPD, CKD-stage 4,HTN, TIA,MGUS.   RNCM completed home  visit. Present at home visit was nursing student. Client was in bed when RNCM arrived. She was assisted up to chair by daughter. Client reports right hip/leg pain and states pain medication helps. Client unable to stay up in chair for longer than 30 minutes. She went back to lay bed with daughter's assistance.  Daughter expressing needs for personal care assistance. She continues to appeal decision for continued stay at rehabilitation. RNCM discussed private pay option and to think about a couple hours/day several days/week, also encouraged her to think about some assistance  from someone in her church, friends. Daughter acknowledges that she does not like to ask for help. Discussed possibility of client being eligible for Aids and Attendance as a benefit of client's husband's Veterans status. [name] states she is open to see if client is eligible for Medicaid. [facility] social work to provide information.  Home health has not established care yet. RNCM called and spoke with [facility]. RNCM spoke with Edwena Blow who reported nurse would be out by tomorrow. Client's daughter [name] also spoke with agency and was told that a nurse will be out today for start of care.  Bruising covering client's left arm/hand. duoderm noted to client's right hand. Daughter reports she placed duoderm on her hand when client broke her skin while attempting to clean self.  Medications reviewed-client is on a large number of medications. RNCM noted client confused about what time she took medications last. Client's daughter manages medications and is without questions or concerns at this time.  Plan: [facility] social worker, [name] updated, telephonic transition of care call next week.  [facility] CM Care Plan Problem One     Most Recent Value  Care Plan Problem One  recent transition from rehabilitation center related to complications from right hip pain, heart failure, pneumonia  Role Documenting the Problem One   Care Management [place] for Problem One  Active  [facility] Long Term Goal   client will remain out of the hospital within the next 31 days.  [facility] Long Term Goal Start Date  04/15/17  Interventions for Problem One Long Term Goal  reviewed medications, RNCM called home health agency regarding start of care, RNCM encouarged follow up with upcoming providers, provided [facility] calender/organization and explained how to use, updated [place] worker regarding client request for advanced directive packet and information on Aides and Attendance through the [employer],  [facility] CM Short Term Goal #1   client will report decrease in pain level to a 5 or less within the next 30 days.   [facility] CM Short Term Goal #1 Start Date  04/15/17  Interventions for Short Term Goal #1  RNCM discussed warm heating pad as option, encouarged not to leave on more than 20 minutes at a time/protect skin, encouraged working and engaing with home health agency.  [facility] CM Short Term Goal #2   client verbalize start of care of home health within the next 1-2 weeks.  [facility] CM Short Term Goal #2 Start Date  04/15/17  Interventions for Short Term Goal #2  RNCM called home heatlh agency regarding start of care.    [facility] CM Care Plan Problem Two     Most Recent Value  Care Plan Problem Two  alteration in skin integrity as evidence by duoderm on hand  Role Documenting the Problem Two  Care Management [place] for Problem Two  Active  [facility] CM Short Term Goal #1   client/daughter will show primary care provider at next office visit within the next 1-2 weeks.  [facility] CM Short Term Goal #1 Start Date  04/19/17  Interventions for Short Term Goal #2   RNCM encouraged daughter to discuss with primary care at office visit tomorrow.. encouraged to protect skin.        Thea Silversmith, RN, MSN, [place] Coordinator Cell: [phone]

## 2017-04-20 ENCOUNTER — Other Ambulatory Visit: Payer: Self-pay | Admitting: Licensed Clinical Social Worker

## 2017-04-20 DIAGNOSIS — M353 Polymyalgia rheumatica: Secondary | ICD-10-CM | POA: Diagnosis not present

## 2017-04-20 DIAGNOSIS — D631 Anemia in chronic kidney disease: Secondary | ICD-10-CM | POA: Diagnosis not present

## 2017-04-20 DIAGNOSIS — J690 Pneumonitis due to inhalation of food and vomit: Secondary | ICD-10-CM | POA: Diagnosis not present

## 2017-04-20 DIAGNOSIS — D472 Monoclonal gammopathy: Secondary | ICD-10-CM | POA: Diagnosis not present

## 2017-04-20 DIAGNOSIS — N189 Chronic kidney disease, unspecified: Secondary | ICD-10-CM | POA: Diagnosis not present

## 2017-04-20 DIAGNOSIS — N390 Urinary tract infection, site not specified: Secondary | ICD-10-CM | POA: Diagnosis not present

## 2017-04-20 NOTE — Patient Outreach (Signed)
[facility]) Care Management  04/20/2017  Rebecca [name] [birth year] [id number]  Assessment- CSW received call from [place] on 04/19/17. Patient's daughter has changed her mind and wishes to gain more community resource education in regards to personal care assistance. CSW completed call to patient's daughter to schedule home visit in order to review requested resources. Daughter answered. HIPPA verifications provided. Daughter reports that she would need to call CSW back at a later time as the [facility] Nurse is currently there working with patient.  Plan-CSW will await for return call from daughter.  [name], BSW, MSW, [email or web address] Phone: [phone] Fax: [phone]

## 2017-04-21 ENCOUNTER — Other Ambulatory Visit: Payer: Self-pay | Admitting: Licensed Clinical Social Worker

## 2017-04-21 DIAGNOSIS — D638 Anemia in other chronic diseases classified elsewhere: Secondary | ICD-10-CM | POA: Diagnosis not present

## 2017-04-21 DIAGNOSIS — N184 Chronic kidney disease, stage 4 (severe): Secondary | ICD-10-CM | POA: Diagnosis not present

## 2017-04-21 DIAGNOSIS — R5381 Other malaise: Secondary | ICD-10-CM | POA: Diagnosis not present

## 2017-04-21 DIAGNOSIS — I350 Nonrheumatic aortic (valve) stenosis: Secondary | ICD-10-CM | POA: Diagnosis not present

## 2017-04-21 DIAGNOSIS — M859 Disorder of bone density and structure, unspecified: Secondary | ICD-10-CM | POA: Diagnosis not present

## 2017-04-21 DIAGNOSIS — D509 Iron deficiency anemia, unspecified: Secondary | ICD-10-CM | POA: Diagnosis not present

## 2017-04-21 NOTE — Patient Outreach (Signed)
[place] [facility]) Care Management  04/21/2017  Khalessi Blough Latin [birth year] [id number]  Assessment- CSW received incoming call from patient's daughter. Daughter reports things to be "getting better" and that she was able to get some sleep last night. She is interested in scheduling home visit for next week. Appointment scheduled for 04/25/17. Daughter reports that [place] still has not came out yet but she has already contacted [facility] and strongly encouraged them to come out. Patient shares that she is interested in considering a private pay caregiver as she knows several people that use to work in the [employer]. CSW will complete home visit next week and will review personal care resources.  Plan-CSW will complete home visit next week.  [name], BSW, MSW, [email or web address] Phone: [phone] Fax: [phone]

## 2017-04-22 DIAGNOSIS — F250 Schizoaffective disorder, bipolar type: Secondary | ICD-10-CM | POA: Diagnosis not present

## 2017-04-24 ENCOUNTER — Encounter (HOSPITAL_COMMUNITY): Payer: Self-pay | Admitting: Emergency Medicine

## 2017-04-24 ENCOUNTER — Emergency Department (HOSPITAL_COMMUNITY): Payer: Medicare Other

## 2017-04-24 ENCOUNTER — Inpatient Hospital Stay (HOSPITAL_COMMUNITY)
Admission: EM | Admit: 2017-04-24 | Discharge: 2017-05-11 | DRG: 871 | Disposition: A | Discharge status: Hospice | Payer: Medicare Other | Attending: Internal Medicine | Admitting: Internal Medicine

## 2017-04-24 ENCOUNTER — Other Ambulatory Visit: Payer: Self-pay

## 2017-04-24 DIAGNOSIS — M353 Polymyalgia rheumatica: Secondary | ICD-10-CM | POA: Diagnosis present

## 2017-04-24 DIAGNOSIS — G459 Transient cerebral ischemic attack, unspecified: Secondary | ICD-10-CM | POA: Diagnosis present

## 2017-04-24 DIAGNOSIS — F259 Schizoaffective disorder, unspecified: Secondary | ICD-10-CM | POA: Diagnosis not present

## 2017-04-24 DIAGNOSIS — J9601 Acute respiratory failure with hypoxia: Secondary | ICD-10-CM | POA: Diagnosis present

## 2017-04-24 DIAGNOSIS — E039 Hypothyroidism, unspecified: Secondary | ICD-10-CM | POA: Diagnosis not present

## 2017-04-24 DIAGNOSIS — Z7189 Other specified counseling: Secondary | ICD-10-CM

## 2017-04-24 DIAGNOSIS — B3781 Candidal esophagitis: Secondary | ICD-10-CM | POA: Diagnosis not present

## 2017-04-24 DIAGNOSIS — I130 Hypertensive heart and chronic kidney disease with heart failure and stage 1 through stage 4 chronic kidney disease, or unspecified chronic kidney disease: Secondary | ICD-10-CM | POA: Diagnosis present

## 2017-04-24 DIAGNOSIS — E1122 Type 2 diabetes mellitus with diabetic chronic kidney disease: Secondary | ICD-10-CM | POA: Diagnosis present

## 2017-04-24 DIAGNOSIS — J690 Pneumonitis due to inhalation of food and vomit: Secondary | ICD-10-CM | POA: Diagnosis present

## 2017-04-24 DIAGNOSIS — K649 Unspecified hemorrhoids: Secondary | ICD-10-CM | POA: Diagnosis present

## 2017-04-24 DIAGNOSIS — R059 Cough, unspecified: Secondary | ICD-10-CM

## 2017-04-24 DIAGNOSIS — Z66 Do not resuscitate: Secondary | ICD-10-CM | POA: Diagnosis present

## 2017-04-24 DIAGNOSIS — Z8673 Personal history of transient ischemic attack (TIA), and cerebral infarction without residual deficits: Secondary | ICD-10-CM

## 2017-04-24 DIAGNOSIS — N3010 Interstitial cystitis (chronic) without hematuria: Secondary | ICD-10-CM | POA: Diagnosis present

## 2017-04-24 DIAGNOSIS — Z955 Presence of coronary angioplasty implant and graft: Secondary | ICD-10-CM | POA: Diagnosis not present

## 2017-04-24 DIAGNOSIS — B001 Herpesviral vesicular dermatitis: Secondary | ICD-10-CM | POA: Diagnosis not present

## 2017-04-24 DIAGNOSIS — R05 Cough: Secondary | ICD-10-CM | POA: Diagnosis not present

## 2017-04-24 DIAGNOSIS — R638 Other symptoms and signs concerning food and fluid intake: Secondary | ICD-10-CM

## 2017-04-24 DIAGNOSIS — B9689 Other specified bacterial agents as the cause of diseases classified elsewhere: Secondary | ICD-10-CM | POA: Diagnosis present

## 2017-04-24 DIAGNOSIS — E785 Hyperlipidemia, unspecified: Secondary | ICD-10-CM | POA: Diagnosis present

## 2017-04-24 DIAGNOSIS — N184 Chronic kidney disease, stage 4 (severe): Secondary | ICD-10-CM | POA: Diagnosis present

## 2017-04-24 DIAGNOSIS — Z833 Family history of diabetes mellitus: Secondary | ICD-10-CM

## 2017-04-24 DIAGNOSIS — R627 Adult failure to thrive: Secondary | ICD-10-CM | POA: Diagnosis present

## 2017-04-24 DIAGNOSIS — N189 Chronic kidney disease, unspecified: Secondary | ICD-10-CM | POA: Diagnosis not present

## 2017-04-24 DIAGNOSIS — Z90710 Acquired absence of both cervix and uterus: Secondary | ICD-10-CM

## 2017-04-24 DIAGNOSIS — J80 Acute respiratory distress syndrome: Secondary | ICD-10-CM | POA: Diagnosis not present

## 2017-04-24 DIAGNOSIS — Z7952 Long term (current) use of systemic steroids: Secondary | ICD-10-CM

## 2017-04-24 DIAGNOSIS — Z79899 Other long term (current) drug therapy: Secondary | ICD-10-CM

## 2017-04-24 DIAGNOSIS — K209 Esophagitis, unspecified without bleeding: Secondary | ICD-10-CM

## 2017-04-24 DIAGNOSIS — R1110 Vomiting, unspecified: Secondary | ICD-10-CM | POA: Diagnosis not present

## 2017-04-24 DIAGNOSIS — R402441 Other coma, without documented Glasgow coma scale score, or with partial score reported, in the field [EMT or ambulance]: Secondary | ICD-10-CM | POA: Diagnosis not present

## 2017-04-24 DIAGNOSIS — E871 Hypo-osmolality and hyponatremia: Secondary | ICD-10-CM | POA: Diagnosis present

## 2017-04-24 DIAGNOSIS — N179 Acute kidney failure, unspecified: Secondary | ICD-10-CM | POA: Diagnosis present

## 2017-04-24 DIAGNOSIS — A419 Sepsis, unspecified organism: Secondary | ICD-10-CM | POA: Diagnosis not present

## 2017-04-24 DIAGNOSIS — B370 Candidal stomatitis: Secondary | ICD-10-CM | POA: Diagnosis not present

## 2017-04-24 DIAGNOSIS — D631 Anemia in chronic kidney disease: Secondary | ICD-10-CM | POA: Diagnosis present

## 2017-04-24 DIAGNOSIS — R1310 Dysphagia, unspecified: Secondary | ICD-10-CM

## 2017-04-24 DIAGNOSIS — I10 Essential (primary) hypertension: Secondary | ICD-10-CM | POA: Diagnosis not present

## 2017-04-24 DIAGNOSIS — R0902 Hypoxemia: Secondary | ICD-10-CM

## 2017-04-24 DIAGNOSIS — Z7982 Long term (current) use of aspirin: Secondary | ICD-10-CM

## 2017-04-24 DIAGNOSIS — I5032 Chronic diastolic (congestive) heart failure: Secondary | ICD-10-CM | POA: Diagnosis present

## 2017-04-24 DIAGNOSIS — R918 Other nonspecific abnormal finding of lung field: Secondary | ICD-10-CM | POA: Diagnosis not present

## 2017-04-24 DIAGNOSIS — D472 Monoclonal gammopathy: Secondary | ICD-10-CM | POA: Diagnosis present

## 2017-04-24 DIAGNOSIS — R630 Anorexia: Secondary | ICD-10-CM | POA: Diagnosis not present

## 2017-04-24 DIAGNOSIS — K5900 Constipation, unspecified: Secondary | ICD-10-CM | POA: Diagnosis not present

## 2017-04-24 DIAGNOSIS — Z0189 Encounter for other specified special examinations: Secondary | ICD-10-CM

## 2017-04-24 DIAGNOSIS — J189 Pneumonia, unspecified organism: Secondary | ICD-10-CM | POA: Diagnosis present

## 2017-04-24 DIAGNOSIS — Z515 Encounter for palliative care: Secondary | ICD-10-CM | POA: Diagnosis not present

## 2017-04-24 DIAGNOSIS — J449 Chronic obstructive pulmonary disease, unspecified: Secondary | ICD-10-CM | POA: Diagnosis present

## 2017-04-24 DIAGNOSIS — M7061 Trochanteric bursitis, right hip: Secondary | ICD-10-CM | POA: Diagnosis present

## 2017-04-24 DIAGNOSIS — M47817 Spondylosis without myelopathy or radiculopathy, lumbosacral region: Secondary | ICD-10-CM | POA: Diagnosis present

## 2017-04-24 DIAGNOSIS — R112 Nausea with vomiting, unspecified: Secondary | ICD-10-CM

## 2017-04-24 DIAGNOSIS — E860 Dehydration: Secondary | ICD-10-CM | POA: Diagnosis present

## 2017-04-24 DIAGNOSIS — Z8249 Family history of ischemic heart disease and other diseases of the circulatory system: Secondary | ICD-10-CM

## 2017-04-24 DIAGNOSIS — N390 Urinary tract infection, site not specified: Secondary | ICD-10-CM | POA: Diagnosis present

## 2017-04-24 DIAGNOSIS — Z79890 Hormone replacement therapy (postmenopausal): Secondary | ICD-10-CM

## 2017-04-24 DIAGNOSIS — J181 Lobar pneumonia, unspecified organism: Secondary | ICD-10-CM | POA: Diagnosis not present

## 2017-04-24 DIAGNOSIS — T380X5A Adverse effect of glucocorticoids and synthetic analogues, initial encounter: Secondary | ICD-10-CM | POA: Diagnosis present

## 2017-04-24 DIAGNOSIS — I2510 Atherosclerotic heart disease of native coronary artery without angina pectoris: Secondary | ICD-10-CM | POA: Diagnosis present

## 2017-04-24 DIAGNOSIS — Z4682 Encounter for fitting and adjustment of non-vascular catheter: Secondary | ICD-10-CM | POA: Diagnosis not present

## 2017-04-24 DIAGNOSIS — K219 Gastro-esophageal reflux disease without esophagitis: Secondary | ICD-10-CM | POA: Diagnosis not present

## 2017-04-24 DIAGNOSIS — Z7951 Long term (current) use of inhaled steroids: Secondary | ICD-10-CM

## 2017-04-24 DIAGNOSIS — E872 Acidosis: Secondary | ICD-10-CM | POA: Diagnosis not present

## 2017-04-24 DIAGNOSIS — G8929 Other chronic pain: Secondary | ICD-10-CM | POA: Diagnosis present

## 2017-04-24 DIAGNOSIS — R1312 Dysphagia, oropharyngeal phase: Secondary | ICD-10-CM | POA: Diagnosis present

## 2017-04-24 DIAGNOSIS — Z789 Other specified health status: Secondary | ICD-10-CM | POA: Diagnosis not present

## 2017-04-24 DIAGNOSIS — Z87891 Personal history of nicotine dependence: Secondary | ICD-10-CM | POA: Diagnosis not present

## 2017-04-24 DIAGNOSIS — Z4659 Encounter for fitting and adjustment of other gastrointestinal appliance and device: Secondary | ICD-10-CM

## 2017-04-24 DIAGNOSIS — Z7902 Long term (current) use of antithrombotics/antiplatelets: Secondary | ICD-10-CM

## 2017-04-24 LAB — I-STAT VENOUS BLOOD GAS, ED
Acid-base deficit: 1 mmol/L (ref 0.0–2.0)
Bicarbonate: 25.1 mmol/L (ref 20.0–28.0)
O2 Saturation: 59 %
PH VEN: 7.346 (ref 7.250–7.430)
PO2 VEN: 33 mmHg (ref 32.0–45.0)
TCO2: 27 mmol/L (ref 22–32)
pCO2, Ven: 46 mmHg (ref 44.0–60.0)

## 2017-04-24 LAB — COMPREHENSIVE METABOLIC PANEL
ALBUMIN: 3.3 g/dL — AB (ref 3.5–5.0)
ALT: 17 U/L (ref 14–54)
AST: 18 U/L (ref 15–41)
Alkaline Phosphatase: 40 U/L (ref 38–126)
Anion gap: 12 (ref 5–15)
BUN: 71 mg/dL — AB (ref 6–20)
CHLORIDE: 90 mmol/L — AB (ref 101–111)
CO2: 23 mmol/L (ref 22–32)
CREATININE: 2.22 mg/dL — AB (ref 0.44–1.00)
Calcium: 9.3 mg/dL (ref 8.9–10.3)
GFR calc Af Amer: 23 mL/min — ABNORMAL LOW (ref >60)
GFR, EST NON AFRICAN AMERICAN: 20 mL/min — AB (ref >60)
GLUCOSE: 177 mg/dL — AB (ref 65–99)
POTASSIUM: 4.3 mmol/L (ref 3.5–5.1)
SODIUM: 125 mmol/L — AB (ref 135–145)
Total Bilirubin: 0.7 mg/dL (ref 0.3–1.2)
Total Protein: 5.8 g/dL — ABNORMAL LOW (ref 6.5–8.1)

## 2017-04-24 LAB — URINALYSIS, ROUTINE W REFLEX MICROSCOPIC
Bilirubin Urine: NEGATIVE
Glucose, UA: NEGATIVE mg/dL
Hgb urine dipstick: NEGATIVE
KETONES UR: 5 mg/dL — AB
Nitrite: NEGATIVE
PROTEIN: 100 mg/dL — AB
RBC / HPF: NONE SEEN RBC/hpf (ref 0–5)
SQUAMOUS EPITHELIAL / LPF: NONE SEEN
Specific Gravity, Urine: 1.015 (ref 1.005–1.030)
pH: 5 (ref 5.0–8.0)

## 2017-04-24 LAB — CBC WITH DIFFERENTIAL/PLATELET
BASOS PCT: 0 %
Basophils Absolute: 0 10*3/uL (ref 0.0–0.1)
EOS PCT: 0 %
Eosinophils Absolute: 0 10*3/uL (ref 0.0–0.7)
HEMATOCRIT: 32.3 % — AB (ref 36.0–46.0)
Hemoglobin: 11.1 g/dL — ABNORMAL LOW (ref 12.0–15.0)
Lymphocytes Relative: 4 %
Lymphs Abs: 1.1 10*3/uL (ref 0.7–4.0)
MCH: 30.4 pg (ref 26.0–34.0)
MCHC: 34.4 g/dL (ref 30.0–36.0)
MCV: 88.5 fL (ref 78.0–100.0)
MONO ABS: 1.3 10*3/uL — AB (ref 0.1–1.0)
MONOS PCT: 5 %
Neutro Abs: 24.2 10*3/uL — ABNORMAL HIGH (ref 1.7–7.7)
Neutrophils Relative %: 91 %
Platelets: 239 10*3/uL (ref 150–400)
RBC: 3.65 MIL/uL — AB (ref 3.87–5.11)
RDW: 13.7 % (ref 11.5–15.5)
WBC: 26.6 10*3/uL — AB (ref 4.0–10.5)

## 2017-04-24 LAB — I-STAT CG4 LACTIC ACID, ED
LACTIC ACID, VENOUS: 1.93 mmol/L — AB (ref 0.5–1.9)
LACTIC ACID, VENOUS: 2.19 mmol/L — AB (ref 0.5–1.9)

## 2017-04-24 MED ORDER — TIOTROPIUM BROMIDE MONOHYDRATE 18 MCG IN CAPS
1.0000 | ORAL_CAPSULE | Freq: Every day | RESPIRATORY_TRACT | Status: DC
  Administered 2017-04-25: 18 ug via RESPIRATORY_TRACT
  Filled 2017-04-24: qty 5

## 2017-04-24 MED ORDER — ACETAMINOPHEN 325 MG PO TABS
650.0000 mg | ORAL_TABLET | Freq: Four times a day (QID) | ORAL | Status: DC | PRN
  Administered 2017-04-26: 650 mg via ORAL
  Filled 2017-04-24: qty 2

## 2017-04-24 MED ORDER — TAMSULOSIN HCL 0.4 MG PO CAPS
0.4000 mg | ORAL_CAPSULE | Freq: Every day | ORAL | Status: DC
  Administered 2017-04-26 – 2017-05-11 (×14): 0.4 mg via ORAL
  Filled 2017-04-24 (×18): qty 1

## 2017-04-24 MED ORDER — CEFEPIME HCL 1 G IJ SOLR: 1.0000 g | INTRAMUSCULAR | Status: DC

## 2017-04-24 MED ORDER — ACETAMINOPHEN 650 MG RE SUPP: 650.0000 mg | Freq: Four times a day (QID) | RECTAL | Status: DC | PRN

## 2017-04-24 MED ORDER — COLCHICINE 0.6 MG PO TABS: 0.6000 mg | ORAL_TABLET | Freq: Every day | ORAL | Status: DC

## 2017-04-24 MED ORDER — ACETAMINOPHEN 650 MG RE SUPP
650.0000 mg | Freq: Once | RECTAL | Status: AC
  Administered 2017-04-24: 650 mg via RECTAL
  Filled 2017-04-24: qty 1

## 2017-04-24 MED ORDER — PREDNISONE 10 MG PO TABS: 10.0000 mg | ORAL_TABLET | Freq: Every day | ORAL | Status: DC

## 2017-04-24 MED ORDER — PIPERACILLIN-TAZOBACTAM 3.375 G IVPB
3.3750 g | Freq: Three times a day (TID) | INTRAVENOUS | Status: DC
  Administered 2017-04-25 (×4): 3.375 g via INTRAVENOUS
  Filled 2017-04-24 (×5): qty 50

## 2017-04-24 MED ORDER — SODIUM CHLORIDE 0.9 % IV SOLN
INTRAVENOUS | Status: DC
  Administered 2017-04-24 – 2017-04-26 (×4): via INTRAVENOUS

## 2017-04-24 MED ORDER — SENNOSIDES-DOCUSATE SODIUM 8.6-50 MG PO TABS
2.0000 | ORAL_TABLET | Freq: Every day | ORAL | Status: DC
  Administered 2017-04-26 – 2017-04-28 (×2): 2 via ORAL
  Filled 2017-04-24 (×2): qty 2

## 2017-04-24 MED ORDER — TRAMADOL HCL 50 MG PO TABS
50.0000 mg | ORAL_TABLET | Freq: Two times a day (BID) | ORAL | Status: DC | PRN
  Administered 2017-04-29: 50 mg via ORAL
  Filled 2017-04-24: qty 1

## 2017-04-24 MED ORDER — HEPARIN SODIUM (PORCINE) 5000 UNIT/ML IJ SOLN
5000.0000 [IU] | Freq: Three times a day (TID) | INTRAMUSCULAR | Status: DC
  Administered 2017-04-25 – 2017-05-10 (×46): 5000 [IU] via SUBCUTANEOUS
  Filled 2017-04-24 (×46): qty 1

## 2017-04-24 MED ORDER — IPRATROPIUM-ALBUTEROL 0.5-2.5 (3) MG/3ML IN SOLN
3.0000 mL | Freq: Once | RESPIRATORY_TRACT | Status: AC
  Administered 2017-04-24: 3 mL via RESPIRATORY_TRACT
  Filled 2017-04-24: qty 3

## 2017-04-24 MED ORDER — PANTOPRAZOLE SODIUM 40 MG PO TBEC: 40.0000 mg | DELAYED_RELEASE_TABLET | Freq: Every day | ORAL | Status: DC

## 2017-04-24 MED ORDER — IPRATROPIUM-ALBUTEROL 0.5-2.5 (3) MG/3ML IN SOLN
3.0000 mL | Freq: Three times a day (TID) | RESPIRATORY_TRACT | Status: DC
  Administered 2017-04-25 – 2017-05-11 (×49): 3 mL via RESPIRATORY_TRACT
  Filled 2017-04-24 (×50): qty 3

## 2017-04-24 MED ORDER — LEVOTHYROXINE SODIUM 75 MCG PO TABS: 75.0000 ug | ORAL_TABLET | Freq: Every day | ORAL | Status: DC

## 2017-04-24 MED ORDER — ONDANSETRON HCL 4 MG/2ML IJ SOLN
4.0000 mg | Freq: Four times a day (QID) | INTRAMUSCULAR | Status: DC | PRN
  Administered 2017-04-26 – 2017-04-27 (×2): 4 mg via INTRAVENOUS
  Filled 2017-04-24 (×2): qty 2

## 2017-04-24 MED ORDER — VANCOMYCIN HCL IN DEXTROSE 1-5 GM/200ML-% IV SOLN
1000.0000 mg | Freq: Once | INTRAVENOUS | Status: AC
  Administered 2017-04-24: 1000 mg via INTRAVENOUS
  Filled 2017-04-24: qty 200

## 2017-04-24 MED ORDER — ASPIRIN EC 81 MG PO TBEC: 81.0000 mg | DELAYED_RELEASE_TABLET | Freq: Two times a day (BID) | ORAL | Status: DC | PRN

## 2017-04-24 MED ORDER — ONDANSETRON HCL 4 MG PO TABS: 4.0000 mg | ORAL_TABLET | Freq: Four times a day (QID) | ORAL | Status: DC | PRN

## 2017-04-24 MED ORDER — PRAVASTATIN SODIUM 40 MG PO TABS: 20.0000 mg | ORAL_TABLET | Freq: Every day | ORAL | Status: DC

## 2017-04-24 MED ORDER — SODIUM CHLORIDE 0.9 % IV BOLUS (SEPSIS)
500.0000 mL | Freq: Once | INTRAVENOUS | Status: AC
  Administered 2017-04-24: 500 mL via INTRAVENOUS

## 2017-04-24 MED ORDER — GUAIFENESIN 200 MG PO TABS
400.0000 mg | ORAL_TABLET | Freq: Three times a day (TID) | ORAL | Status: DC
  Administered 2017-04-26 – 2017-04-29 (×6): 400 mg via ORAL
  Filled 2017-04-24 (×15): qty 2

## 2017-04-24 MED ORDER — VANCOMYCIN HCL 500 MG IV SOLR
500.0000 mg | INTRAVENOUS | Status: DC
  Filled 2017-04-24: qty 500

## 2017-04-24 MED ORDER — CRANBERRY 300 MG PO TABS: 300.0000 mg | ORAL_TABLET | Freq: Every day | ORAL | Status: DC

## 2017-04-24 MED ORDER — DEXTROSE 5 % IV SOLN
2.0000 g | Freq: Once | INTRAVENOUS | Status: AC
  Administered 2017-04-24: 2 g via INTRAVENOUS
  Filled 2017-04-24: qty 2

## 2017-04-24 MED ORDER — POLYETHYLENE GLYCOL 3350 17 G PO PACK
17.0000 g | PACK | Freq: Two times a day (BID) | ORAL | Status: DC
  Administered 2017-04-26 – 2017-04-29 (×3): 17 g via ORAL
  Filled 2017-04-24 (×4): qty 1

## 2017-04-24 MED ORDER — CLOZAPINE 100 MG PO TABS: 300.0000 mg | ORAL_TABLET | Freq: Every day | ORAL | Status: DC

## 2017-04-24 MED ORDER — MOMETASONE FURO-FORMOTEROL FUM 200-5 MCG/ACT IN AERO
2.0000 | INHALATION_SPRAY | Freq: Two times a day (BID) | RESPIRATORY_TRACT | Status: DC
  Administered 2017-04-25 – 2017-05-11 (×31): 2 via RESPIRATORY_TRACT
  Filled 2017-04-24: qty 8.8

## 2017-04-24 MED ORDER — FLUTICASONE PROPIONATE 50 MCG/ACT NA SUSP
2.0000 | Freq: Every day | NASAL | Status: DC | PRN
  Filled 2017-04-24: qty 16

## 2017-04-24 MED ORDER — SODIUM CHLORIDE 0.9 % IV BOLUS (SEPSIS)
500.0000 mL | Freq: Once | INTRAVENOUS | Status: AC
Start: 2017-04-24 — End: 2017-04-24
  Administered 2017-04-24: 500 mL via INTRAVENOUS

## 2017-04-24 MED ORDER — ISOSORBIDE MONONITRATE ER 60 MG PO TB24
60.0000 mg | ORAL_TABLET | Freq: Every day | ORAL | Status: DC
  Administered 2017-04-26 – 2017-05-01 (×4): 60 mg via ORAL
  Filled 2017-04-24 (×4): qty 1

## 2017-04-24 MED ORDER — CLOPIDOGREL BISULFATE 75 MG PO TABS
75.0000 mg | ORAL_TABLET | Freq: Every day | ORAL | Status: DC
  Administered 2017-04-26: 75 mg via ORAL
  Filled 2017-04-24: qty 1

## 2017-04-24 NOTE — ED Triage Notes (Signed)
Per EMS: Pt LKW yesterday. Pt recently treated for UTI and pneumonia but finished Tx.  Pt sudden onset AMS today alert to self, yesterday A&Ox4.  Pt vomited once yesterday. Rhonchi throughout. Diminished on R side.  EKG NSR. Pt given breathing Tx this AM and started to cough up brown mucus.  Pt 88% on RA, no change with [place] and 100% on non-rebreather.  Pt also given 400 NS. Pt perked up after O2 and fluids.

## 2017-04-24 NOTE — ED Notes (Signed)
Xray bedside.

## 2017-04-24 NOTE — H&P (Signed)
History and Physical    KAO BERKHEIMER TKZ:[id number] DOB: 1940/10/13 DOA: 04/24/2017  PCP: [name], MD  Patient coming from: [facility] [facility] Home   Chief Complaint: lethargy, fevers  HPI: Rebecca [name] is a 76 y.o. female with medical history significant of COPD, PMR with chronic prednisone therapy, CAD status post DES to RCA in 2006, CKD stage IV, diastolic heart failure, MGUS, hypothyroidism who was most recently admitted at [facility] long hospital secondary to right-sided trochanteric bursitis, acute on chronic diastolic heart failure and healthcare acquired pneumonia.  She was discharged to a skilled nursing facility and subsequently to home.  Daughter is at bedside and is able to provide history.  She states that patient had been progressing well at skilled nursing facility until last week when she started to have low-grade fevers. She had also complained of dysuria and urine culture was obtained. She was started on ceftin. Then last night, patient became much more lethargic with higher fever as well as cough with productive yellow sputum.  She had no complaints of chest pain or worsening shortness of breath, no abdominal pain although she did have one episode of vomiting.  Patient's daughter states that patient has been on a pured diet to prevent for any aspiration, although patient does appear to aspirate a vomiting episode.   ED Course: Labs obtained which revealed hyponatremia 125, acute kidney injury with creatinine 2.2, leukocytosis of 26.6.  Chest x-ray revealed left lower lobe consolidation.  TRH asked for admission for healthcare acquired pneumonia.  Review of Systems: As per HPI otherwise 10 point review of systems negative.   Past Medical History:  Diagnosis Date  . AKI (acute kidney injury) ([place])   . Anemia in chronic renal disease   . Aortic atherosclerosis ([place]) 01/27/2017  . CAD (coronary artery disease) 2006   a. s/p DES to RCA in 2006 b. low-risk NST in 2014  c. cath in 08/2015 showing patent RCA stent with nonobstructive disease  . CKD (chronic kidney disease) stage 4, GFR 15-29 ml/min (HCC)   . Complication of anesthesia   . COPD (chronic obstructive pulmonary disease) ([place])   . Diabetes mellitus   . Diastolic heart failure, NYHA class 1 ([place])   . Dyslipidemia   . Dysuria 01/06/2015  . Frozen shoulder    right  . Gastroesophageal reflux   . HAP (hospital-acquired pneumonia)   . Headache   . Hyperkalemia 01/01/2016  . Hypothyroidism   . 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 Obesity   . Pancreatic mass 01/27/2017  . Pneumonia 06/2016  . Polymyalgia rheumatica ([place])   . PONV (postoperative nausea and vomiting)   . Renal insufficiency   . Respiratory distress   . Schizoaffective disorder ([place])   . Sepsis due to pneumonia ([place])   . Shortness of breath   . Stroke [facility])     Past Surgical History:  Procedure Laterality Date  . ABDOMINAL HYSTERECTOMY    . CATARACT EXTRACTION    . CHOLECYSTECTOMY    . CORONARY ANGIOPLASTY WITH STENT PLACEMENT  2006   Taxus DES to RCA, 50 % residual  . PARTIAL HYSTERECTOMY  1995     reports that she quit smoking about 13 years ago. She has a 35.00 pack-year smoking history. she has never used smokeless tobacco. She reports that she does not drink alcohol or use drugs.  Allergies  Allergen Reactions  . Levaquin [Levofloxacin] Other (See Comments)    Ruptured tendon  .  Tape Other (See Comments)    PATIENT'S SKIN IS VERY, VERY THIN AND WILL TEAR AND BRUISE EASILY; PLEASE USE AN ALTERNATIVE (SOMETHING OTHER THAN TAPE)    Family History  Problem Relation Age of Onset  . Diabetes Mother   . Heart disease Mother   . Heart disease Father   . Arthritis Father   . Healthy Sister   . Heart disease Brother   . Cancer Brother        colon ca  . Osteoporosis Sister   . Heart disease Brother   . Diabetes Brother   . Heart disease Brother   . Diabetes Brother   . Diabetes  Brother   . Healthy Brother   . Healthy Brother   . Healthy Brother   . Cancer Maternal Uncle        blood condition  . Cancer Daughter        breast ca  . Cancer Cousin        NHL    Prior to Admission medications   Medication Sig Start Date End Date Taking? Authorizing Provider  acetaminophen (TYLENOL) 500 MG tablet Take 1 tablet (500 mg total) by mouth every 6 (six) hours as needed (pain). Patient taking differently: Take 1,000 mg 2 (two) times daily as needed by mouth (pain).  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    Yes [provider]  cefUROXime (CEFTIN) 500 MG tablet Take 500 mg 2 (two) times daily with a meal by mouth. 7 day course filled 04/17/17   Yes [provider]  cetirizine (ZYRTEC) 10 MG tablet Take 10 mg by mouth at bedtime.    Yes [provider]  clopidogrel (PLAVIX) 75 MG tablet Take 1 tablet (75 mg total) by mouth daily with breakfast. Patient taking differently: Take 75 mg by mouth at bedtime.  10/18/13  Yes Thurnell Lose, MD  cloZAPine (CLOZARIL) 100 MG tablet Take 300 mg by mouth at bedtime. 11/04/16  Yes [provider]  colchicine 0.6 MG tablet Take 0.6 mg See admin instructions by mouth. Take 1 tablet (0.6 mg) by mouth at bedtime with food 11/9 thru 11/13 - then give results to Dr. Leigh Aurora when called   Yes [provider]  Cranberry (SM CRANBERRY) 300 MG tablet Take 300 mg daily with breakfast by mouth.    Yes [provider]  diltiazem (CARDIZEM CD) 240 MG 24 hr capsule Take 240 mg daily with breakfast by mouth.    Yes [provider]  fluticasone (FLONASE) 50 MCG/ACT nasal spray Place 2 sprays daily as needed into both nostrils (seasonal allergies).  03/04/17  Yes [provider]  Fluticasone-Salmeterol (ADVAIR) 250-50 MCG/DOSE AEPB Inhale 1  puff into the lungs at bedtime.   Yes [provider]  furosemide (LASIX) 20 MG tablet Take 20 mg daily as needed by mouth (weight gain of 3 lbs in 24 hours).   Yes [provider]  guaifenesin (HUMIBID E) 400 MG TABS tablet Take 400 mg 3 (three) times daily by mouth.   Yes [provider]  ipratropium-albuterol (DUONEB) 0.5-2.5 (3) MG/3ML SOLN Inhale 3 mLs into the lungs 3 (three) times daily.  11/05/16  Yes [provider]  isosorbide mononitrate (IMDUR) 60 MG 24 hr tablet Take 1 tablet (60 mg total) by mouth daily. Patient taking differently:  Take 60 mg daily with breakfast by mouth.  03/14/13  Yes Isaiah Serge, NP  levothyroxine (SYNTHROID, LEVOTHROID) 75 MCG tablet Take 75 mcg by mouth daily before breakfast.   Yes [provider]  Liniments ([place]) Place 1 patch See admin instructions onto the skin. Apply one patch to lower back and one patch to hip every morning as needed for pain, remove after 8 hours   Yes [provider]  metoprolol succinate (TOPROL-XL) 100 MG 24 hr tablet Take 100 mg by mouth daily with breakfast. Take with or immediately following a meal.   Yes [provider]  Multiple Vitamin (MULTIVITAMIN WITH MINERALS) TABS tablet Take 1 tablet daily with supper by mouth.   Yes [provider]  nitroGLYCERIN (NITROSTAT) 0.4 MG SL tablet Place 1 tablet (0.4 mg total) under the tongue every 5 (five) minutes as needed. For chest pain Patient taking differently: Place 0.4 mg every 5 (five) minutes as needed under the tongue for chest pain.  01/11/17  Yes Strader, [place] M, PA-C  ondansetron (ZOFRAN) 4 MG tablet Take 1 tablet (4 mg total) by mouth every 8 (eight) hours as needed for nausea or vomiting. 01/16/17  Yes Tegeler, Gwenyth Allegra, MD  pantoprazole (PROTONIX) 40 MG tablet Take 1 tablet (40 mg total) by mouth daily. Patient taking differently: Take 40 mg daily with breakfast by mouth.   04/03/13  Yes Hilty, Nadean Corwin, MD  polyethylene glycol powder (GLYCOLAX/MIRALAX) powder Take 17 g by mouth 2 (two) times daily. [place] Patient taking differently: Take 17 g 2 (two) times daily by mouth. Mix in 8 oz liquid and drink 01/28/17  Yes Samuella Cota, MD  pravastatin (PRAVACHOL) 20 MG tablet Take 20 mg by mouth at bedtime.   Yes [provider]  predniSONE (DELTASONE) 10 MG tablet Take 1 tablet (10 mg total) by mouth daily with breakfast. 10/11/16  Yes Charlynne Cousins, MD  Probiotic Product (PROBIOTIC PO) Take 1 capsule daily with breakfast by mouth.    Yes [provider]  senna-docusate (SENNA-S) 8.6-50 MG tablet Take 2 tablets at bedtime by mouth.   Yes [provider]  tamsulosin (FLOMAX) 0.4 MG CAPS capsule Take 1 capsule (0.4 mg total) by mouth daily. Patient taking differently: Take 0.4 mg daily with breakfast by mouth.  03/27/17  Yes Thurnell Lose, MD  Tiotropium Bromide Monohydrate (SPIRIVA RESPIMAT) 2.5 MCG/ACT AERS Inhale 2 puffs daily into the lungs.    Yes [provider]  traMADol (ULTRAM) 50 MG tablet Take 50 mg See admin instructions by mouth. Take 1 tablet (50 mg) by mouth daily with breakfast, may also take 1 tablet (50 mg) two more times daily as needed for pain   Yes [provider]  TraMADol HCl 300 MG CP24 Take 300 mg daily as needed by mouth (pain from physical therapy).  02/22/17  Yes [provider]  Vitamin D, Ergocalciferol, (DRISDOL) 50000 UNITS CAPS capsule Take 1 capsule (50,000 Units total) by mouth every 7 (seven) days. Patient taking differently: Take 50,000 Units every Monday by mouth.  08/27/13  Yes Kinnie Feil, MD  HYDROmorphone (DILAUDID) 2 MG tablet Take 1 tablet (2 mg total) by mouth every 6 (six) hours as needed for severe pain. Patient not taking: Reported on 04/24/2017 03/26/17   Thurnell Lose, MD  senna (SENOKOT) 8.6 MG TABS tablet Take 2 tablets (17.2 mg total) by  mouth at bedtime. Patient not taking: Reported  on 04/24/2017 01/28/17   Samuella Cota, MD    Physical Exam: Vitals:   04/24/17 1715 04/24/17 1730 04/24/17 1745 04/24/17 1800  BP: (!) 111/40 (!) 96/43 (!) 100/42 (!) 102/38  Pulse: 95 94 93 91  Resp: (!) 25 (!) 23 (!) 24 (!) 22  Temp:      TempSrc:      SpO2: 91% 93% 92% 92%  Weight:      Height:        Constitutional: NAD, calm, comfortable Eyes: PERRL, lids and conjunctivae normal ENMT: Mucous membranes are dry. Posterior pharynx clear of any exudate or lesions.Normal dentition.  Neck: normal, supple, no masses, no thyromegaly Respiratory: +diminished breath sounds, poor inspiratory effort, no wheezing, no crackles. Normal respiratory effort. No accessory muscle use.  Cardiovascular: Regular rate and rhythm, no murmurs / rubs / gallops. No extremity edema. Abdomen: no tenderness, no masses palpated. No hepatosplenomegaly. Bowel sounds positive.  Musculoskeletal: no clubbing / cyanosis. No joint deformity upper and lower extremities. Good ROM, no contractures. Normal muscle tone.  Skin: no rashes, lesions, ulcers. No induration. +easy bruising  Neurologic: Nonfocal examination, alert to voice but easily falls asleep    Labs on Admission: I have personally reviewed following labs and imaging studies  CBC: Recent Labs  Lab 04/24/17 1501  WBC 26.6*  NEUTROABS 24.2*  HGB 11.1*  HCT 32.3*  MCV 88.5  PLT 657   Basic Metabolic Panel: Recent Labs  Lab 04/24/17 1501  NA 125*  K 4.3  CL 90*  CO2 23  GLUCOSE 177*  BUN 71*  CREATININE 2.22*  CALCIUM 9.3   GFR: Estimated Creatinine Clearance: 17.3 mL/min (A) (by C-G formula based on SCr of 2.22 mg/dL (H)). Liver Function Tests: Recent Labs  Lab 04/24/17 1501  AST 18  ALT 17  ALKPHOS 40  BILITOT 0.7  PROT 5.8*  ALBUMIN 3.3*   No results for input(s): LIPASE, AMYLASE in the last 168 hours. No results for input(s): AMMONIA in the last 168 hours. Coagulation  Profile: No results for input(s): INR, PROTIME in the last 168 hours. Cardiac Enzymes: No results for input(s): CKTOTAL, [name], [name], TROPONINI in the last 168 hours. BNP (last 3 results) Recent Labs    03/29/17 1646  PROBNP 84.0   HbA1C: No results for input(s): HGBA1C in the last 72 hours. CBG: No results for input(s): GLUCAP in the last 168 hours. Lipid Profile: No results for input(s): CHOL, [name], [name], [name], [name], LDLDIRECT in the last 72 hours. Thyroid Function Tests: No results for input(s): TSH, T4TOTAL, FREET4, T3FREE, THYROIDAB in the last 72 hours. Anemia Panel: No results for input(s): VITAMINB12, FOLATE, FERRITIN, TIBC, IRON, RETICCTPCT in the last 72 hours. Urine analysis:    Component Value Date/Time   COLORURINE YELLOW 04/24/2017 1501   APPEARANCEUR HAZY (A) 04/24/2017 1501   LABSPEC 1.015 04/24/2017 1501   LABSPEC 1.005 03/07/2017 1549   PHURINE 5.0 04/24/2017 1501   GLUCOSEU NEGATIVE 04/24/2017 1501   GLUCOSEU Negative 03/07/2017 1549   HGBUR NEGATIVE 04/24/2017 1501   HGBUR negative 12/03/2008 1113   BILIRUBINUR NEGATIVE 04/24/2017 1501   BILIRUBINUR Negative 03/07/2017 1549   KETONESUR 5 (A) 04/24/2017 1501   PROTEINUR 100 (A) 04/24/2017 1501   UROBILINOGEN 0.2 03/07/2017 1549   NITRITE NEGATIVE 04/24/2017 1501   LEUKOCYTESUR SMALL (A) 04/24/2017 1501   LEUKOCYTESUR Small 03/07/2017 1549   Sepsis Labs: !!!!!!!!!!!!!!!!!!!!!!!!!!!!!!!!!!!!!!!!!!!! @LABRCNTIP (procalcitonin:4,lacticidven:4) )No results found for this or any previous visit (from the past 240 hour(s)).  Radiological Exams on Admission: Dg Chest Portable 1 View  Result Date: 04/24/2017 CLINICAL DATA:  Altered mental status. EXAM: PORTABLE CHEST 1 VIEW COMPARISON:  CT of the chest 03/21/2017 FINDINGS: Enlarged cardiac silhouette. Calcific atherosclerotic disease and tortuosity of the aorta. Left lower lobe airspace consolidation and moderate left pleural effusion. Low lung  volumes. Osseous structures are without acute abnormality. Soft tissues are grossly normal. IMPRESSION: Left lower lobe airspace consolidation/ pleural effusion. Electronically Signed   By: Fidela Salisbury M.D.   On: 04/24/2017 15:34    EKG: Independently reviewed. Normal sinus rhythm   Assessment/Plan Active Problems:   HCAP (healthcare-associated pneumonia)   Sepsis secondary to HCAP -Recently admitted 10/1-10/13 for HCAP and treated with vanco/zosyn, discharged on Augmentin  -Possibly aspiration, SLP consult. Continue dysphagia 1 diet for now -Empiric vanco, zosyn to cover aspiration  -Blood culture pending  -Check respiratory PCR   Acute hypoxemic respiratory failure -Secondary to above, continue [place] O2   Interstitial cystitis  -Previous cultures positive for pseudomonas -ID evaluated patient previous admission and determined her to have colonization of pseudomonas. No antibiotic treatment unless clearly symptomatic. Patient had been started on ceftin as outpatient last week due to dysuria complaint. On zosyn as above.   Chronic hip pain secondary to L5-S1 spondylosis, right trochanteric bursitis  -Seen by ortho (Dr. Ninfa Linden) previous admission and underwent intra-articular steroid injection 03/24/2017   CAD -Aspirin, plavix, imdur   COPD -Continue nebs   PMR -Chronic prednisone 10mg  daily, recently started on colchicine by Rheumatologist   MGUS -Follows with [name]   HTN  -Metoprolol, cardizem - hold due to low BP in ED   Chronic diastolic CHF -EF 28%. Appears dehydrated   Hypothyroidism -Continue synthroid  Hyponatremia -Previously seen by nephrology, likely combination of low solute intake and hypovolemic hyponatremia at that time -IVF, trend BMP   AKI on CKD stage 4  -IVF   GERD -Continue PPI   HLD -Continue pravachol   Schizoaffective disorder -Continue clozaril    DVT prophylaxis: subq hep  Code Status: Full  Family Communication:  daughter at bedside  Disposition Plan: Pending improvement, home vs SNF  Consults called: None  Admission status: inpatient   Severity of Illness: The appropriate patient status for this patient is INPATIENT. Inpatient status is judged to be reasonable and necessary in order to provide the required intensity of service to ensure the patient's safety. The patient's presenting symptoms, physical exam findings, and initial radiographic and laboratory data in the context of their chronic comorbidities is felt to place them at high risk for further clinical deterioration. Furthermore, it is not anticipated that the patient will be medically stable for discharge from the hospital within 2 midnights of admission. The following factors support the patient status of inpatient.   " The patient's presenting symptoms include fever, lethargy. " The worrisome physical exam findings include lethargic, low BP. " The initial radiographic and laboratory data are worrisome because of elevated WBC, pneumonia. " The chronic co-morbidities include as above .  * I certify that at the point of admission it is my clinical judgment that the patient will require inpatient hospital care spanning beyond 2 midnights from the point of admission due to high intensity of service, high risk for further deterioration and high frequency of surveillance required.*   Dessa Phi, DO [facility] [email or web address] Password [facility] 04/24/2017, 6:27 PM

## 2017-04-24 NOTE — Progress Notes (Signed)
Pharmacy Antibiotic Note  Rebecca [name] is a 76 y.o. female admitted on 04/24/2017 with pneumonia.  Pharmacy has been consulted for vancomycin and cefepime dosing. -WBC= 26.6, tmax= 100.9, SCr= 2.22 (last was 1.19 in 03/2017), CrCl ~ 20 -cefepime 2gm given at ~ 3pm and vancomycin 1gm given at ~ 3pm  Plan: -Cefepime 1gm IV q24hr -Vancomycin 500mg  IV q24h -Will follow renal function, cultures and clinical progress   Height: 5\' 3"  (160 cm) Weight: 132 lb (59.9 kg) IBW/kg (Calculated) : 52.4  Temp (24hrs), Avg:100.9 F (38.3 C), Min:100.9 F (38.3 C), Max:100.9 F (38.3 C)  Recent Labs  Lab 04/24/17 1501 04/24/17 1521  WBC 26.6*  --   CREATININE 2.22*  --   LATICACIDVEN  --  1.93*    Estimated Creatinine Clearance: 17.3 mL/min (A) (by C-G formula based on SCr of 2.22 mg/dL (H)).    Allergies  Allergen Reactions  . Levaquin [Levofloxacin] Other (See Comments)    Ruptured tendon  . Tape Other (See Comments)    PATIENT'S SKIN IS VERY, VERY THIN AND WILL TEAR AND BRUISE EASILY; PLEASE USE AN ALTERNATIVE (SOMETHING OTHER THAN TAPE)    Antimicrobials this admission: 11/11 vanc 11/11 cefepime  Dose adjustments this admission:   Microbiology results: 11/11 urine 11/11 blood x2  Thank you for allowing pharmacy to be a part of this patient's care.  Hildred Laser, Pharm D 04/24/2017 4:57 PM

## 2017-04-24 NOTE — ED Provider Notes (Signed)
[facility] EMERGENCY DEPARTMENT Provider Note   CSN: [id number] Arrival date & time: 04/24/17  1422     History   Chief Complaint Chief Complaint  Patient presents with  . Altered Mental Status    HPI Rebecca [name] is a 76 y.o. female.  HPI  76 y.o. female with a hx of CAD, CKD, COPD, DM, presents to the Emergency Department today via EMS from daugher's home due to AMS, cough and increased lethargy. Pt with recent DC on 03-26-17 due to pain management initially for right hip. Noted development of HCAP. Pt finished Tx for PNA as well as UTI at facility. Pt notes worsening symptoms yesterday with increased fatigue and lethargy. Cough is productive with brown sputum production. Pt altered upon EMS arrival to facility with only alert to self. Rhonchi noted throughout with diminished sounds on right. Pt given neb treatment and 425mL NS bolus. Oxygen saturations 88% on RA. Noted x 1 episode emesis yesterday. Pt denies pain currently, but is altered.     Level V Caveat: AMS  Past Medical History:  Diagnosis Date  . AKI (acute kidney injury) ([place])   . Anemia in chronic renal disease   . Aortic atherosclerosis ([place]) 01/27/2017  . CAD (coronary artery disease) 2006   a. s/p DES to RCA in 2006 b. low-risk NST in 2014 c. cath in 08/2015 showing patent RCA stent with nonobstructive disease  . CKD (chronic kidney disease) stage 4, GFR 15-29 ml/min (HCC)   . Complication of anesthesia   . COPD (chronic obstructive pulmonary disease) ([place])   . Diabetes mellitus   . Diastolic heart failure, NYHA class 1 ([place])   . Dyslipidemia   . Dysuria 01/06/2015  . Frozen shoulder    right  . Gastroesophageal reflux   . HAP (hospital-acquired pneumonia)   . Headache   . Hyperkalemia 01/01/2016  . Hypothyroidism   . 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 Obesity   . Pancreatic mass 01/27/2017  . Pneumonia 06/2016  . Polymyalgia rheumatica ([place])   . PONV  (postoperative nausea and vomiting)   . Renal insufficiency   . Respiratory distress   . Schizoaffective disorder ([place])   . Sepsis due to pneumonia ([place])   . Shortness of breath   . Stroke [facility])     Patient Active Problem List   Diagnosis Date Noted  . Pulmonary infiltrates c/w recurrent aspiration pneumonitis vs hcap 03/30/2017  . Prerenal azotemia 03/30/2017  . Dyspnea on exertion 03/29/2017  . Weakness   . Hypervolemia   . Lethargy   . Pain of right hip joint   . Acute right hip pain 03/14/2017  . Physical debility 03/08/2017  . Recurrent UTI 03/08/2017  . Ascites 03/08/2017  . Chronic pain disorder 03/07/2017  . Chronic constipation   . Community acquired pneumonia   . Palliative care encounter   . Encounter for hospice care discussion   . TIA (transient ischemic attack) 01/27/2017  . Pancreatic mass 01/27/2017  . Aortic atherosclerosis ([place]) 01/27/2017  . Hyponatremia 01/25/2017  . Generalized weakness 01/25/2017  . Fatigue 01/11/2017  . Mixed hyperlipidemia 12/02/2016  . Encephalopathy 11/20/2016  . Altered mental status 11/19/2016  . Aspiration pneumonia of both lower lobes due to gastric secretions ([place]) 10/10/2016  . Pneumonia 10/07/2016  . Lobar pneumonia, unspecified organism ([place]) 10/07/2016  . Ileus ([place]) 10/07/2016  . HCAP (healthcare-associated pneumonia) 09/12/2016  . AKI (acute kidney injury) ([place]) 09/12/2016  . HAP (hospital-acquired pneumonia)  09/12/2016  . Respiratory distress 09/12/2016  . Pressure injury of skin 07/15/2016  . Essential hypertension 07/14/2016  . Acute encephalopathy 07/14/2016  . Coronary artery disease involving native coronary artery of native heart without angina pectoris 05/14/2016  . CKD stage 4, GFR 15-29 ml/min  08/11/2015  . 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 Leukocytosis 01/28/2014  . History of TIA - May 2015- Plavix 10/16/2013  . Polymyalgia rheumatica ([place]) 10/03/2012  . Chronic  diastolic (congestive) heart failure ([place]) 07/15/2012  . Trochanteric bursitis of right hip 04/11/2012  . Dysuria 08/30/2011  . History of small bowel obstruction 11/04/2010  . Schizoaffective disorder ([place]) 01/10/2008  . Dyslipidemia 01/25/2007  . Hypothyroidism 08/11/2006  . Anemia in chronic renal disease 08/11/2006  . GASTROESOPHAGEAL REFLUX, NO ESOPHAGITIS 08/11/2006    Past Surgical History:  Procedure Laterality Date  . ABDOMINAL HYSTERECTOMY    . CATARACT EXTRACTION    . CHOLECYSTECTOMY    . CORONARY ANGIOPLASTY WITH STENT PLACEMENT  2006   Taxus DES to RCA, 50 % residual  . PARTIAL HYSTERECTOMY  1995    OB History    No data available       Home Medications    Prior to Admission medications   Medication Sig Start Date End Date Taking? Authorizing Provider  acetaminophen (TYLENOL) 500 MG tablet Take 1 tablet (500 mg total) by mouth every 6 (six) hours as needed (pain). Patient taking differently: Take 1,000 mg by mouth 2 (two) times daily as needed for moderate pain.  11/21/16   Arrien, Jimmy Picket, MD  amoxicillin-clavulanate (AUGMENTIN) 875-125 MG tablet Take 1 tablet by mouth every 12 (twelve) hours. 3 more days Patient not taking: Reported on 04/15/2017 03/29/17   Tanda Rockers, MD  aspirin EC 81 MG tablet Take 81 mg as needed by mouth.    [provider]  cefUROXime (CEFTIN) 500 MG tablet Take 500 mg 2 (two) times daily with a meal by mouth.    [provider]  cetirizine (ZYRTEC) 10 MG tablet Take 10 mg by mouth at bedtime.     [provider]  clopidogrel (PLAVIX) 75 MG tablet Take 1 tablet (75 mg total) by mouth daily with breakfast. Patient taking differently: Take 75 mg by mouth at bedtime.  10/18/13   Thurnell Lose, MD  cloZAPine (CLOZARIL) 100 MG tablet Take 300 mg by mouth at bedtime. 11/04/16   [provider]  colchicine 0.6 MG tablet Take 0.6 mg 2 (two) times daily by mouth.    [provider]    Cranberry (SM CRANBERRY) 300 MG tablet Take 300 mg by mouth daily.    [provider]  diltiazem (CARDIZEM CD) 240 MG 24 hr capsule Take 240 mg daily by mouth.    [provider]  diltiazem (CARDIZEM LA) 180 MG 24 hr tablet Take 180 mg by mouth daily with breakfast. 07/16/15   [provider]  Fluticasone-Salmeterol (ADVAIR) 250-50 MCG/DOSE AEPB Inhale 1 puff into the lungs at bedtime.    [provider]  furosemide (LASIX) 20 MG tablet Take 1 tablet (20 mg total) by mouth daily as needed. Patient taking differently: Take 20 mg by mouth daily as needed for fluid or edema.  12/02/16 03/29/17  Pixie Casino, MD  GUAIFENESIN PO Take 400 mg by mouth 3 (three) times daily.     [provider]  HYDROmorphone (DILAUDID) 2 MG tablet Take 1 tablet (2 mg total) by mouth every 6 (six)  hours as needed for severe pain. 03/26/17   Thurnell Lose, MD  ipratropium-albuterol (DUONEB) 0.5-2.5 (3) MG/3ML SOLN Inhale 3 mLs into the lungs 3 (three) times daily.  11/05/16   [provider]  isosorbide mononitrate (IMDUR) 60 MG 24 hr tablet Take 1 tablet (60 mg total) by mouth daily. 03/14/13   Isaiah Serge, NP  levothyroxine (SYNTHROID, LEVOTHROID) 75 MCG tablet Take 75 mcg by mouth daily before breakfast.    [provider]  Liniments (SALONPAS PAIN RELIEF PATCH EX) Apply topically daily. Apply one to lower back and one to hip each am, remove after 8 hours    [provider]  metoprolol succinate (TOPROL-XL) 100 MG 24 hr tablet Take 100 mg by mouth daily with breakfast. Take with or immediately following a meal.    [provider]  nitroGLYCERIN (NITROSTAT) 0.4 MG SL tablet Place 1 tablet (0.4 mg total) under the tongue every 5 (five) minutes as needed. For chest pain 01/11/17   Ahmed Prima, [place] M, PA-C  ondansetron (ZOFRAN) 4 MG tablet Take 1 tablet (4 mg total) by mouth every 8 (eight) hours as needed for nausea or vomiting. 01/16/17    Tegeler, Gwenyth Allegra, MD  pantoprazole (PROTONIX) 40 MG tablet Take 1 tablet (40 mg total) by mouth daily. 04/03/13   Hilty, Nadean Corwin, MD  polyethylene glycol powder (GLYCOLAX/MIRALAX) powder Take 17 g by mouth 2 (two) times daily. MIX AND DRINK 01/28/17   Samuella Cota, MD  pravastatin (PRAVACHOL) 20 MG tablet Take 20 mg by mouth at bedtime.    [provider]  predniSONE (DELTASONE) 10 MG tablet Take 1 tablet (10 mg total) by mouth daily with breakfast. 10/11/16   Charlynne Cousins, MD  Probiotic Product (PROBIOTIC PO) Take 1 capsule by mouth daily.    [provider]  senna (SENOKOT) 8.6 MG TABS tablet Take 2 tablets (17.2 mg total) by mouth at bedtime. 01/28/17   Samuella Cota, MD  tamsulosin (FLOMAX) 0.4 MG CAPS capsule Take 1 capsule (0.4 mg total) by mouth daily. Patient not taking: Reported on 04/19/2017 03/27/17   Thurnell Lose, MD  Tiotropium Bromide Monohydrate (SPIRIVA RESPIMAT) 2.5 MCG/ACT AERS Inhale 2 puffs into the lungs every morning.     [provider]  Vitamin D, Ergocalciferol, (DRISDOL) 50000 UNITS CAPS capsule Take 1 capsule (50,000 Units total) by mouth every 7 (seven) days. 08/27/13   Kinnie Feil, MD    Family History Family History  Problem Relation Age of Onset  . Diabetes Mother   . Heart disease Mother   . Heart disease Father   . Arthritis Father   . Healthy Sister   . Heart disease Brother   . Cancer Brother        colon ca  . Osteoporosis Sister   . Heart disease Brother   . Diabetes Brother   . Heart disease Brother   . Diabetes Brother   . Diabetes Brother   . Healthy Brother   . Healthy Brother   . Healthy Brother   . Cancer Maternal Uncle        blood condition  . Cancer Daughter        breast ca  . Cancer Cousin        NHL    Social History Social History   Tobacco Use  . Smoking status: Former Smoker    Packs/day: 1.00    Years: 35.00    Pack years: 35.00  Last attempt to quit:  04/14/2004    Years since quitting: 13.0  . Smokeless tobacco: Never Used  Substance Use Topics  . Alcohol use: No  . Drug use: No     Allergies   Levaquin [levofloxacin] and Tape   Review of Systems Review of Systems  Unable to perform ROS: Mental status change    Physical Exam Updated Vital Signs BP (!) 126/47   Pulse 96   Temp (!) 100.9 F (38.3 C) (Rectal)   Resp (!) 25   Ht 5\' 3"  (1.6 m)   Wt 59.9 kg (132 lb)   SpO2 95%   BMI 23.38 kg/m   Physical Exam  Constitutional: She appears well-developed and well-nourished. No distress.  HENT:  Head: Normocephalic and atraumatic.  Right Ear: Tympanic membrane, external ear and ear canal normal.  Left Ear: Tympanic membrane, external ear and ear canal normal.  Nose: Nose normal.  Mouth/Throat: Uvula is midline, oropharynx is clear and moist and mucous membranes are normal. No trismus in the jaw. No oropharyngeal exudate, posterior oropharyngeal erythema or tonsillar abscesses.  Eyes: EOM are normal. Pupils are equal, round, and reactive to light.  Neck: Normal range of motion. Neck supple. No tracheal deviation present.  Cardiovascular: Normal rate, regular rhythm, S1 normal, S2 normal, normal heart sounds, intact distal pulses and normal pulses.  Pulmonary/Chest: Effort normal. No respiratory distress. She has decreased breath sounds in the right upper field and the right lower field. She has no wheezes. She has rhonchi in the right upper field, the right lower field, the left upper field and the left lower field. She has no rales.  Abdominal: Normal appearance and bowel sounds are normal. There is no tenderness.  Musculoskeletal: Normal range of motion.  Neurological: She is alert. She has normal strength. She is disoriented. No sensory deficit.  Skin: Skin is warm and dry.  Psychiatric: She has a normal mood and affect. Her speech is normal and behavior is normal. Thought content normal.  Nursing note and vitals  reviewed.  ED Treatments / Results  Labs (all labs ordered are listed, but only abnormal results are displayed) Labs Reviewed  COMPREHENSIVE METABOLIC PANEL - Abnormal; Notable for the following components:      Result Value   Sodium 125 (*)    Chloride 90 (*)    Glucose, Bld 177 (*)    BUN 71 (*)    Creatinine, Ser 2.22 (*)    Total Protein 5.8 (*)    Albumin 3.3 (*)    GFR calc non Af Amer 20 (*)    GFR calc Af Amer 23 (*)    All other components within normal limits  CBC WITH DIFFERENTIAL/PLATELET - Abnormal; Notable for the following components:   WBC 26.6 (*)    RBC 3.65 (*)    Hemoglobin 11.1 (*)    HCT 32.3 (*)    Neutro Abs 24.2 (*)    Monocytes Absolute 1.3 (*)    All other components within normal limits  URINALYSIS, ROUTINE W REFLEX MICROSCOPIC - Abnormal; Notable for the following components:   APPearance HAZY (*)    Ketones, ur 5 (*)    Protein, ur 100 (*)    Leukocytes, UA SMALL (*)    Bacteria, UA MANY (*)    All other components within normal limits  I-STAT CG4 LACTIC ACID, ED - Abnormal; Notable for the following components:   Lactic Acid, Venous 1.93 (*)    All other components within normal  limits  CULTURE, BLOOD (ROUTINE X 2)  CULTURE, BLOOD (ROUTINE X 2)  URINE CULTURE  BLOOD GAS, VENOUS  I-STAT VENOUS BLOOD GAS, ED  I-STAT CG4 LACTIC ACID, ED    EKG  EKG Interpretation None      Radiology Dg Chest Portable 1 View  Result Date: 04/24/2017 CLINICAL DATA:  Altered mental status. EXAM: PORTABLE CHEST 1 VIEW COMPARISON:  CT of the chest 03/21/2017 FINDINGS: Enlarged cardiac silhouette. Calcific atherosclerotic disease and tortuosity of the aorta. Left lower lobe airspace consolidation and moderate left pleural effusion. Low lung volumes. Osseous structures are without acute abnormality. Soft tissues are grossly normal. IMPRESSION: Left lower lobe airspace consolidation/ pleural effusion. Electronically Signed   By: Fidela Salisbury M.D.   On:  04/24/2017 15:34    Procedures Procedures (including critical care time)  Medications Ordered in ED Medications  vancomycin (VANCOCIN) 500 mg in sodium chloride 0.9 % 100 mL IVPB (not administered)  ceFEPIme (MAXIPIME) 1 g in dextrose 5 % 50 mL IVPB (not administered)  ceFEPIme (MAXIPIME) 2 g in dextrose 5 % 50 mL IVPB (0 g Intravenous Stopped 04/24/17 1609)  vancomycin (VANCOCIN) IVPB 1000 mg/200 mL premix (1,000 mg Intravenous New Bag/Given 04/24/17 1516)  acetaminophen (TYLENOL) suppository 650 mg (650 mg Rectal Given 04/24/17 1509)  sodium chloride 0.9 % bolus 500 mL (500 mLs Intravenous New Bag/Given 04/24/17 1616)  ipratropium-albuterol (DUONEB) 0.5-2.5 (3) MG/3ML nebulizer solution 3 mL (3 mLs Nebulization Given 04/24/17 1629)   Initial Impression / Assessment and Plan / ED Course  I have reviewed the triage vital signs and the nursing notes.  Pertinent labs & imaging results that were available during my care of the patient were reviewed by me and considered in my medical decision making (see chart for details).  Final Clinical Impressions(s) / ED Diagnoses  {I have reviewed and evaluated the relevant laboratory values. {I have reviewed and evaluated the relevant imaging studies. {I have interpreted the relevant EKG. {I have reviewed the relevant previous healthcare records. {I have reviewed EMS Documentation. {I obtained HPI from historian. {Patient discussed with supervising physician.  ED Course:  Assessment: Pt is a 76 y.o. female with a hx of CAD, CKD, COPD, DM, presents to the Emergency Department today via EMS from [facility]. Pt with recent DC on 03-26-17 due to pain management initially for right hip. Noted development of HCAP. Pt finished Tx for PNA as well as UTI at facility. Pt notes worsening symptoms yesterday with increased fatigue and lethargy. Cough is productive with brown sputum production. Pt altered upon EMS arrival to facility with only alert to  self. Rhonchi noted throughout with diminished sounds on right. Pt given neb treatment and 471mL NS bolus. Oxygen saturations 88% on RA. Noted x 1 episode emesis yesterday. Pt denies pain currently, but is altered. On exam, pt in septic appearing. VS with HR 95. Normotensive. Os sat 83% on RA. Placed veni mask with adequate oxygenation. Temp 100.25F. Lungs diffuse rhonchi. Heart RRR. Abdomen nontender soft. iStat Lactate 1.93. WBC 26.6. Code Sepsis initiated. Made NPO. Given vanc/cefepime abx to cover HCAP. Given 500 NS bolus as received 400 NS per EMS. Pt normotensive with Lactic <3. Will withhold weight based fluids due to CHF. Plan is to Admit to medicine. Repeat sepsis assessment completed. Transitioned to 3L [place] with O2 saturations 92%. Mental status improved with oxygen therapy.   Disposition/Plan:  Admit Pt acknowledges and agrees with plan  Supervising Physician Malvin Johns, MD  Final diagnoses:  HCAP (healthcare-associated pneumonia)    ED Discharge Orders    None         Shary Decamp, PA-C 04/24/17 [place], MD 04/27/17 1133

## 2017-04-25 ENCOUNTER — Other Ambulatory Visit: Payer: Self-pay | Admitting: Licensed Clinical Social Worker

## 2017-04-25 ENCOUNTER — Ambulatory Visit: Payer: Self-pay

## 2017-04-25 LAB — RESPIRATORY PANEL BY PCR
Adenovirus: NOT DETECTED
Bordetella pertussis: NOT DETECTED
CORONAVIRUS OC43-RVPPCR: NOT DETECTED
Chlamydophila pneumoniae: NOT DETECTED
Coronavirus 229E: NOT DETECTED
Coronavirus HKU1: NOT DETECTED
Coronavirus NL63: NOT DETECTED
INFLUENZA B-RVPPCR: NOT DETECTED
Influenza A: NOT DETECTED
METAPNEUMOVIRUS-RVPPCR: NOT DETECTED
Mycoplasma pneumoniae: NOT DETECTED
PARAINFLUENZA VIRUS 1-RVPPCR: NOT DETECTED
PARAINFLUENZA VIRUS 2-RVPPCR: NOT DETECTED
PARAINFLUENZA VIRUS 3-RVPPCR: NOT DETECTED
Parainfluenza Virus 4: NOT DETECTED
RESPIRATORY SYNCYTIAL VIRUS-RVPPCR: NOT DETECTED
RHINOVIRUS / ENTEROVIRUS - RVPPCR: NOT DETECTED

## 2017-04-25 LAB — EXPECTORATED SPUTUM ASSESSMENT W GRAM STAIN, RFLX TO RESP C

## 2017-04-25 LAB — BASIC METABOLIC PANEL
Anion gap: 12 (ref 5–15)
BUN: 84 mg/dL — ABNORMAL HIGH (ref 6–20)
CO2: 22 mmol/L (ref 22–32)
Calcium: 9.1 mg/dL (ref 8.9–10.3)
Chloride: 93 mmol/L — ABNORMAL LOW (ref 101–111)
Creatinine, Ser: 2.71 mg/dL — ABNORMAL HIGH (ref 0.44–1.00)
GFR calc non Af Amer: 16 mL/min — ABNORMAL LOW (ref >60)
GFR, EST AFRICAN AMERICAN: 18 mL/min — AB (ref >60)
GLUCOSE: 174 mg/dL — AB (ref 65–99)
POTASSIUM: 4.6 mmol/L (ref 3.5–5.1)
Sodium: 127 mmol/L — ABNORMAL LOW (ref 135–145)

## 2017-04-25 LAB — CBC
HEMATOCRIT: 31.1 % — AB (ref 36.0–46.0)
HEMOGLOBIN: 10.7 g/dL — AB (ref 12.0–15.0)
MCH: 30.5 pg (ref 26.0–34.0)
MCHC: 34.4 g/dL (ref 30.0–36.0)
MCV: 88.6 fL (ref 78.0–100.0)
Platelets: 187 10*3/uL (ref 150–400)
RBC: 3.51 MIL/uL — AB (ref 3.87–5.11)
RDW: 13.9 % (ref 11.5–15.5)
WBC: 23.9 10*3/uL — AB (ref 4.0–10.5)

## 2017-04-25 LAB — VITAMIN B12: Vitamin B-12: 475 pg/mL (ref 180–914)

## 2017-04-25 LAB — MRSA PCR SCREENING: MRSA BY PCR: NEGATIVE

## 2017-04-25 LAB — STREP PNEUMONIAE URINARY ANTIGEN: STREP PNEUMO URINARY ANTIGEN: NEGATIVE

## 2017-04-25 LAB — EXPECTORATED SPUTUM ASSESSMENT W REFEX TO RESP CULTURE

## 2017-04-25 MED ORDER — PIPERACILLIN-TAZOBACTAM IN DEX 2-0.25 GM/50ML IV SOLN
2.2500 g | Freq: Three times a day (TID) | INTRAVENOUS | Status: DC
  Administered 2017-04-26 (×2): 2.25 g via INTRAVENOUS
  Filled 2017-04-25 (×3): qty 50

## 2017-04-25 MED ORDER — VANCOMYCIN HCL IN DEXTROSE 1-5 GM/200ML-% IV SOLN
1000.0000 mg | INTRAVENOUS | Status: DC
  Administered 2017-04-26: 1000 mg via INTRAVENOUS
  Filled 2017-04-25 (×2): qty 200

## 2017-04-25 MED ORDER — PANTOPRAZOLE SODIUM 40 MG IV SOLR
40.0000 mg | INTRAVENOUS | Status: DC
  Administered 2017-04-25 – 2017-04-26 (×2): 40 mg via INTRAVENOUS
  Filled 2017-04-25 (×2): qty 40

## 2017-04-25 MED ORDER — CLOZAPINE 100 MG PO TABS
300.0000 mg | ORAL_TABLET | Freq: Every day | ORAL | Status: DC
  Administered 2017-04-26 – 2017-04-28 (×2): 300 mg via ORAL
  Filled 2017-04-25 (×5): qty 3

## 2017-04-25 MED ORDER — DEXAMETHASONE SODIUM PHOSPHATE 10 MG/ML IJ SOLN
10.0000 mg | INTRAMUSCULAR | Status: DC
  Administered 2017-04-25 – 2017-05-03 (×9): 10 mg via INTRAVENOUS
  Filled 2017-04-25 (×9): qty 1

## 2017-04-25 MED ORDER — LEVOTHYROXINE SODIUM 100 MCG IV SOLR
37.5000 ug | Freq: Every day | INTRAVENOUS | Status: DC
  Administered 2017-04-26 – 2017-04-27 (×2): 37.5 ug via INTRAVENOUS
  Filled 2017-04-25 (×2): qty 5

## 2017-04-25 NOTE — Progress Notes (Signed)
SLP Cancellation Note  Patient Details Name: Rebecca [name] MRN: [id number] DOB: 02/15/1941   Cancelled treatment:        Returned this afternoon but pt unable to stay awake for po's.    [name] 04/25/2017, 3:13 PM    Orbie Pyo Colvin Caroli.Ed [employer] [phone]

## 2017-04-25 NOTE — Progress Notes (Signed)
OT Cancellation Note  Patient Details Name: ALLEXUS OVENS MRN: [id number] DOB: 20-Apr-1941   Cancelled Treatment:    Reason Eval/Treat Not Completed: Other (comment);Patient not medically ready. RN requesting hold therapy this date due to medical issues. Will check back as appropriate.  Norman Herrlich, MS OTR/L  Pager: [phone]   Norman Herrlich 04/25/2017, 9:41 AM

## 2017-04-25 NOTE — Progress Notes (Signed)
[facility]  Patient Status: Active (receiving services up to time of hospitalization)  [facility] is providing the following services: RN, PT and OT  If patient discharges after hours, please call [phone].   Rebecca [name] 04/25/2017, 11:27 AM

## 2017-04-25 NOTE — Progress Notes (Signed)
PROGRESS NOTE    Rebecca BERNINGER  WIO:[id number] DOB: Oct 09, 1940 DOA: 04/24/2017 PCP: [name], MD     Brief Narrative:  Rebecca [name] is a 76 y.o. female with medical history significant of COPD, PMR with chronic prednisone therapy, CAD status post DES to RCA in 2006, CKD stage IV, diastolic heart failure, MGUS, hypothyroidism who was most recently admitted at [facility] long hospital secondary to right-sided trochanteric bursitis, acute on chronic diastolic heart failure and healthcare acquired pneumonia.  She was discharged to a skilled nursing facility and subsequently to home.  Daughter is at bedside and is able to provide history.  She states that patient had been progressing well at skilled nursing facility until last week when she started to have low-grade fevers. She had also complained of dysuria and urine culture was obtained. She was started on ceftin. Then last night, patient became much more lethargic with higher fever as well as cough with productive yellow sputum.  She had no complaints of chest pain or worsening shortness of breath, no abdominal pain although she did have one episode of vomiting.  Patient's daughter states that patient has been on a pured diet to prevent for any aspiration, although patient does appear to aspirate a vomiting episode. Chest x-ray revealed left lower lobe consolidation. Patient was admitted for healthcare acquired pneumonia, possibly aspiration.   Assessment & Plan:   Active Problems:   HCAP (healthcare-associated pneumonia)   Sepsis secondary to HCAP, possibly aspiration component  -Recently admitted 10/1-10/13 for HCAP and treated with vanco/zosyn, discharged on Augmentin  -Possibly aspiration, SLP consult. NPO for now  -Empiric vanco, zosyn to cover aspiration  -Blood culture pending  -Respiratory PCR pending   Acute hypoxemic respiratory failure -Secondary to above, continue [place] O2. Currently on venti mask   AKI on CKD stage 4  -IVF    Hyponatremia -Previously seen by nephrology, likely combination of low solute intake and hypovolemic hyponatremia at that time -IVF, trend BMP. Improving Na   Interstitial cystitis  -Previous cultures positive for pseudomonas -ID evaluated patient previous admission and determined her to have colonization of pseudomonas. No antibiotic treatment unless clearly symptomatic. Patient had been started on ceftin as outpatient last week due to dysuria complaint. On zosyn as above.   Chronic hip pain secondary to L5-S1 spondylosis, right trochanteric bursitis  -Seen by ortho (Dr. Ninfa Linden) previous admission and underwent intra-articular steroid injection 03/24/2017   CAD -Aspirin, plavix, imdur. Holding oral meds while NPO   COPD -Continue nebs. Not in acute exacerbation   PMR -Chronic prednisone 10mg  daily, recently started on colchicine by Rheumatologist. Holding oral meds while NPO, switched to decadron IV for now   MGUS -Follows with [name]   HTN  -Metoprolol, cardizem. Holding oral meds while NPO   Chronic diastolic CHF -EF 42%. Appears dehydrated   Hypothyroidism -Continue synthroid, switched to IV while NPO   GERD -Continue PPI   HLD -Pravachol. Holding oral meds while NPO   Schizoaffective disorder -Clozaril. Holding oral meds while NPO     DVT prophylaxis: subq hep Code Status: Full Family Communication: daughter at bedside Disposition Plan: pending improvement, home vs SNF    Consultants:   None  Procedures:   None   Antimicrobials:  Anti-infectives (From admission, onward)   Start     Dose/Rate Route Frequency Ordered Stop   04/25/17 1500  vancomycin (VANCOCIN) 500 mg in sodium chloride 0.9 % 100 mL IVPB     500 mg 100 mL/hr over  60 Minutes Intravenous Every 24 hours 04/24/17 1700     04/25/17 1500  ceFEPIme (MAXIPIME) 1 g in dextrose 5 % 50 mL IVPB  Status:  Discontinued     1 g 100 mL/hr over 30 Minutes Intravenous Every 24  hours 04/24/17 1700 04/24/17 2225   04/24/17 2330  piperacillin-tazobactam (ZOSYN) IVPB 3.375 g     3.375 g 100 mL/hr over 30 Minutes Intravenous Every 8 hours 04/24/17 2225     04/24/17 1445  ceFEPIme (MAXIPIME) 2 g in dextrose 5 % 50 mL IVPB     2 g 100 mL/hr over 30 Minutes Intravenous  Once 04/24/17 1432 04/24/17 1609   04/24/17 1445  vancomycin (VANCOCIN) IVPB 1000 mg/200 mL premix     1,000 mg 200 mL/hr over 60 Minutes Intravenous  Once 04/24/17 1432 04/24/17 1616       Subjective: Patient evaluated with daughter at bedside.  Daughter states that patient has had a lot of brown sputum that has been suctioned.  No other acute events overnight and no new complaints.  She continues to be on Ventimask, speech evaluated patient and she is currently n.p.o. due to moderate aspiration risk.   Objective: Vitals:   04/25/17 0300 04/25/17 0800 04/25/17 0946 04/25/17 0947  BP: (!) 146/51 (!) 122/40    Pulse: 89 (!) 107    Resp: (!) 23 (!) 23    Temp:  99.2 F (37.3 C)    TempSrc:  Axillary    SpO2: 94% 95% 94% 92%  Weight:      Height:        Intake/Output Summary (Last 24 hours) at 04/25/2017 1101 Last data filed at 04/25/2017 0800 Gross per 24 hour  Intake 493.75 ml  Output -  Net 493.75 ml   Filed Weights   04/24/17 1426  Weight: 59.9 kg (132 lb)    Examination:  General exam: Appears alert, fatigued and frail-appearing  Respiratory system: On venti mask, +rhonchi but no wheeze, no respiratory distress  Cardiovascular system: S1 & S2 heard, tachycardic, regular rhythm. No JVD, murmurs, rubs, gallops or clicks. No pedal edema. Gastrointestinal system: Abdomen is nondistended, soft and nontender. No organomegaly or masses felt. Normal bowel sounds heard. Central nervous system: Alert  Extremities: Symmetric  Skin: No rashes, lesions or ulcers  Data Reviewed: I have personally reviewed following labs and imaging studies  CBC: Recent Labs  Lab 04/24/17 1501  04/25/17 0306  WBC 26.6* 23.9*  NEUTROABS 24.2*  --   HGB 11.1* 10.7*  HCT 32.3* 31.1*  MCV 88.5 88.6  PLT 239 109   Basic Metabolic Panel: Recent Labs  Lab 04/24/17 1501 04/25/17 0306  NA 125* 127*  K 4.3 4.6  CL 90* 93*  CO2 23 22  GLUCOSE 177* 174*  BUN 71* 84*  CREATININE 2.22* 2.71*  CALCIUM 9.3 9.1   GFR: Estimated Creatinine Clearance: 14.2 mL/min (A) (by C-G formula based on SCr of 2.71 mg/dL (H)). Liver Function Tests: Recent Labs  Lab 04/24/17 1501  AST 18  ALT 17  ALKPHOS 40  BILITOT 0.7  PROT 5.8*  ALBUMIN 3.3*   No results for input(s): LIPASE, AMYLASE in the last 168 hours. No results for input(s): AMMONIA in the last 168 hours. Coagulation Profile: No results for input(s): INR, PROTIME in the last 168 hours. Cardiac Enzymes: No results for input(s): CKTOTAL, [name], [name], TROPONINI in the last 168 hours. BNP (last 3 results) Recent Labs    03/29/17 1646  PROBNP 84.0  HbA1C: No results for input(s): HGBA1C in the last 72 hours. CBG: No results for input(s): GLUCAP in the last 168 hours. Lipid Profile: No results for input(s): CHOL, [name], [name], [name], [name], LDLDIRECT in the last 72 hours. Thyroid Function Tests: No results for input(s): TSH, T4TOTAL, FREET4, T3FREE, THYROIDAB in the last 72 hours. Anemia Panel: No results for input(s): VITAMINB12, FOLATE, FERRITIN, TIBC, IRON, RETICCTPCT in the last 72 hours. Sepsis Labs: Recent Labs  Lab 04/24/17 1521 04/24/17 1849  LATICACIDVEN 1.93* 2.19*    Recent Results (from the past 240 hour(s))  MRSA PCR Screening     Status: None   Collection Time: 04/25/17 12:23 AM  Result Value Ref Range Status   MRSA by PCR NEGATIVE NEGATIVE Final    Comment:        The GeneXpert MRSA Assay (FDA approved for NASAL specimens only), is one component of a comprehensive MRSA colonization surveillance program. It is not intended to diagnose MRSA infection nor to guide or monitor treatment  for MRSA infections.        Radiology Studies: Dg Chest Portable 1 View  Result Date: 04/24/2017 CLINICAL DATA:  Altered mental status. EXAM: PORTABLE CHEST 1 VIEW COMPARISON:  CT of the chest 03/21/2017 FINDINGS: Enlarged cardiac silhouette. Calcific atherosclerotic disease and tortuosity of the aorta. Left lower lobe airspace consolidation and moderate left pleural effusion. Low lung volumes. Osseous structures are without acute abnormality. Soft tissues are grossly normal. IMPRESSION: Left lower lobe airspace consolidation/ pleural effusion. Electronically Signed   By: Fidela Salisbury M.D.   On: 04/24/2017 15:34      Scheduled Meds: . clopidogrel  75 mg Oral QHS  . cloZAPine  300 mg Oral QHS  . colchicine  0.6 mg Oral QHS  . dexamethasone  10 mg Intravenous Q24H  . guaiFENesin  400 mg Oral TID  . heparin  5,000 Units Subcutaneous Q8H  . ipratropium-albuterol  3 mL Inhalation TID  . isosorbide mononitrate  60 mg Oral Daily  . [START ON 04/26/2017] levothyroxine  37.5 mcg Intravenous QAC breakfast  . mometasone-formoterol  2 puff Inhalation BID  . pantoprazole (PROTONIX) IV  40 mg Intravenous Q24H  . polyethylene glycol  17 g Oral BID  . senna-docusate  2 tablet Oral QHS  . tamsulosin  0.4 mg Oral Q breakfast  . tiotropium  1 capsule Inhalation Daily   Continuous Infusions: . sodium chloride 100 mL/hr at 04/25/17 0753  . piperacillin-tazobactam (ZOSYN)  IV 3.375 g (04/25/17 0556)  . vancomycin       LOS: 1 day    Time spent: 30 minutes   Dessa Phi, DO [facility] [email or web address] Password [email or web address] 04/25/2017, 11:01 AM

## 2017-04-25 NOTE — Evaluation (Signed)
Clinical/Bedside Swallow Evaluation Patient Details  Name: TEONIA YAGER MRN: [id number] Date of Birth: Mar 16, 1941  Today's Date: 04/25/2017 Time: SLP Start Time (ACUTE ONLY): 0844 SLP Stop Time (ACUTE ONLY): 0904 SLP Time Calculation (min) (ACUTE ONLY): 20 min  Past Medical History:  Past Medical History:  Diagnosis Date  . AKI (acute kidney injury) ([place])   . Anemia in chronic renal disease   . Aortic atherosclerosis ([place]) 01/27/2017  . CAD (coronary artery disease) 2006   a. s/p DES to RCA in 2006 b. low-risk NST in 2014 c. cath in 08/2015 showing patent RCA stent with nonobstructive disease  . CKD (chronic kidney disease) stage 4, GFR 15-29 ml/min ([facility])   . Complication of anesthesia   . COPD (chronic obstructive pulmonary disease) ([place])   . Diabetes mellitus   . Diastolic heart failure, NYHA class 1 ([place])   . Dyslipidemia   . Dysuria 01/06/2015  . Frozen shoulder    right  . Gastroesophageal reflux   . HAP (hospital-acquired pneumonia)   . Headache   . Hyperkalemia 01/01/2016  . Hypothyroidism   . 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 Obesity   . Pancreatic mass 01/27/2017  . Pneumonia 06/2016  . Polymyalgia rheumatica ([place])   . PONV (postoperative nausea and vomiting)   . Renal insufficiency   . Respiratory distress   . Schizoaffective disorder ([place])   . Sepsis due to pneumonia ([place])   . Shortness of breath   . Stroke [facility])    Past Surgical History:  Past Surgical History:  Procedure Laterality Date  . ABDOMINAL HYSTERECTOMY    . CATARACT EXTRACTION    . CHOLECYSTECTOMY    . CORONARY ANGIOPLASTY WITH STENT PLACEMENT  2006   [place] DES to RCA, 50 % residual  . PARTIAL HYSTERECTOMY  1995   HPI:  Jahnessa A Coxis a 76 y.o.femalewith medical history significant ofCOPD,PMR, CAD, CVA, pna, GERD, pancreatic mass, barium esophagram revealing mild esophageal dysmotility, CKD stage IV, diastolic heart failure, MGUS, hypothyroidism who was most  recently admitted at [facility] long hospital secondary to right-sided trochanteric bursitis, acute on chronic diastolic heart failure and healthcare acquired pneumonia. This admission found to have healthcare associated pna). MBS 09/2016 reveaked suspected primary esophageal deficits, pharyngeal residue, trace penetration thin to cords silent while consuming barium pill. Dys 3, thin recommended.    Assessment / Plan / Recommendation Clinical Impression  SLP reviewed pt's history of dysphagia with daughter (prior MBS results, esophageal dysmotility) and recommendations. Pt began vomiting; SLP repositioned to more midline position using [place] suction. Desaturation when venti mask removed to 87-88%. Daughter stated pt began vomiting at home Saturday night. No po's given at present. Recommend pt not eat/drink until vomiting ceases and respirations are stable. ST will continue to follow and make safe recommendations.    SLP Visit Diagnosis: Dysphagia, unspecified (R13.10)    Aspiration Risk  Moderate aspiration risk    Diet Recommendation NPO        Other  Recommendations Oral Care Recommendations: Oral care QID   Follow up Recommendations (TBD)      Frequency and Duration min 2x/week  2 weeks       Prognosis Prognosis for Safe Diet Advancement: Good      Swallow Study   General HPI: Charron A Coxis a 76 y.o.femalewith medical history significant ofCOPD,PMR, CAD, CVA, pna, GERD, pancreatic mass, barium esophagram revealing mild esophageal dysmotility, CKD stage IV, diastolic heart failure, MGUS, hypothyroidism who was most recently admitted  at [facility] long hospital secondary to right-sided trochanteric bursitis, acute on chronic diastolic heart failure and healthcare acquired pneumonia. This admission found to have healthcare associated pna). MBS 09/2016 reveaked suspected primary esophageal deficits, pharyngeal residue, trace penetration thin to cords silent while consuming barium pill. Dys  3, thin recommended.  Type of Study: Bedside Swallow Evaluation Previous Swallow Assessment: see HPI Diet Prior to this Study: Dysphagia 1 (puree);Thin liquids Temperature Spikes Noted: Yes Respiratory Status: Other (comment)(partial rebreather) History of Recent Intubation: No Behavior/Cognition: Lethargic/Drowsy;Cooperative Oral Cavity Assessment: Excessive secretions(frequent emesis) Oral Care Completed by SLP: No Oral Cavity - Dentition: Adequate natural dentition Vision: Functional for self-feeding Patient Positioning: Upright in bed Baseline Vocal Quality: Low vocal intensity Volitional Cough: Strong Volitional Swallow: Able to elicit    Oral/Motor/Sensory Function Overall Oral Motor/Sensory Function: (unable at present, no focal weakness)   Ice Chips Ice chips: Not tested   Thin Liquid Thin Liquid: Not tested    Nectar Thick Nectar Thick Liquid: Not tested   Honey Thick Honey Thick Liquid: Not tested   Puree Puree: Not tested   Solid   GO   Solid: Not tested        [name] 04/25/2017,9:17 AM  Orbie Pyo Colvin Caroli.Ed [employer] [phone]

## 2017-04-25 NOTE — Progress Notes (Signed)
PT Cancellation Note  Patient Details Name: TAL NEER MRN: [id number] DOB: 12/17/1940   Cancelled Treatment:    Reason Eval/Treat Not Completed: Medical issues which prohibited therapy. Hold PT eval today per RN due to pt not medically ready. Pt began vomiting during SLP swallow eval this AM.    Lorriane Shire 04/25/2017, 9:38 AM

## 2017-04-25 NOTE — Patient Outreach (Signed)
[facility]) Care Management  04/25/2017  Rebecca [name] Sep 24, 1940 [id number]  Assessment- CSW received notification that patient has been admitted. CSW completed call to daughter [name] and she answered. Home visit that was scheduled for today will need to be canceled. Daughter reports that patient is in ICU and is not doing well. CSW provided emotional support over phone to daughter that she was very receptive to. CSW will follow case and await for hospital discharge.   Plan-Home visit will be canceled for today. CSW will follow case and await for hospital discharge and then will attempt to reschedule appointment.  [name], BSW, MSW, [name] Phone: [phone] Fax: [phone]

## 2017-04-26 LAB — BASIC METABOLIC PANEL
ANION GAP: 9 (ref 5–15)
BUN: 89 mg/dL — AB (ref 6–20)
CHLORIDE: 105 mmol/L (ref 101–111)
CO2: 17 mmol/L — ABNORMAL LOW (ref 22–32)
Calcium: 7.6 mg/dL — ABNORMAL LOW (ref 8.9–10.3)
Creatinine, Ser: 2.99 mg/dL — ABNORMAL HIGH (ref 0.44–1.00)
GFR calc Af Amer: 16 mL/min — ABNORMAL LOW (ref >60)
GFR calc non Af Amer: 14 mL/min — ABNORMAL LOW (ref >60)
Glucose, Bld: 161 mg/dL — ABNORMAL HIGH (ref 65–99)
POTASSIUM: 3.9 mmol/L (ref 3.5–5.1)
SODIUM: 131 mmol/L — AB (ref 135–145)

## 2017-04-26 LAB — URINE CULTURE: Culture: >=100000 — AB

## 2017-04-26 LAB — CBC
HCT: 21 % — ABNORMAL LOW (ref 36.0–46.0)
Hemoglobin: 7.5 g/dL — ABNORMAL LOW (ref 12.0–15.0)
MCH: 32.1 pg (ref 26.0–34.0)
MCHC: 35.7 g/dL (ref 30.0–36.0)
MCV: 89.7 fL (ref 78.0–100.0)
Platelets: 169 10*3/uL (ref 150–400)
RBC: 2.34 MIL/uL — AB (ref 3.87–5.11)
RDW: 14.3 % (ref 11.5–15.5)
WBC: 19.3 10*3/uL — ABNORMAL HIGH (ref 4.0–10.5)

## 2017-04-26 MED ORDER — DILTIAZEM HCL ER COATED BEADS 240 MG PO CP24
240.0000 mg | ORAL_CAPSULE | Freq: Every day | ORAL | Status: DC
  Administered 2017-04-26: 240 mg via ORAL
  Filled 2017-04-26 (×2): qty 1

## 2017-04-26 MED ORDER — METOPROLOL SUCCINATE ER 100 MG PO TB24
100.0000 mg | ORAL_TABLET | Freq: Every day | ORAL | Status: DC
  Administered 2017-04-26: 100 mg via ORAL
  Filled 2017-04-26 (×2): qty 1

## 2017-04-26 MED ORDER — SODIUM CHLORIDE 0.9 % IV SOLN
1.0000 g | INTRAVENOUS | Status: DC
  Administered 2017-04-26 – 2017-04-28 (×3): 1 g via INTRAVENOUS
  Filled 2017-04-26 (×4): qty 1

## 2017-04-26 MED ORDER — COLCHICINE 0.6 MG PO TABS
0.3000 mg | ORAL_TABLET | Freq: Every day | ORAL | Status: DC
  Administered 2017-04-26 – 2017-04-28 (×2): 0.3 mg via ORAL
  Filled 2017-04-26 (×2): qty 1

## 2017-04-26 MED ORDER — PANTOPRAZOLE SODIUM 40 MG PO TBEC: 40.0000 mg | DELAYED_RELEASE_TABLET | Freq: Every day | ORAL | Status: DC

## 2017-04-26 NOTE — Care Management Note (Signed)
Case Management Note  Patient Details  Name: Rebecca [name] MRN: [id number] Date of Birth: 05/10/1941  Subjective/Objective:     Pt admitted with lethargy and fevers - suspected aspiration               Action/Plan:  PTA from home - active with [facility] for RN,OT ,PT.  Prior to returning home pt was a resident at [facility].  PT eval pending   Expected Discharge Date:  04/29/17               Expected Discharge Plan:     In-House Referral:  Clinical Social Work  Discharge planning Services  CM Consult  Post Acute Care Choice:    Choice offered to:     DME Arranged:    DME Agency:     HH Arranged:    HH Agency:     Status of Service:     If discussed at [employer], dates discussed:    Additional Comments:  Maryclare Labrador, RN 04/26/2017, 2:07 PM

## 2017-04-26 NOTE — Evaluation (Signed)
Physical Therapy Evaluation Patient Details Name: Rebecca [name] MRN: [id number] DOB: 12/02/1940 Today's Date: 04/26/2017   History of Present Illness  Pt is a 76 y.o. female admitted on 04/24/17 with workup for sepsis secondary to HCAP and acute hypoxemic respiratory failure. Pertinent PMH includes COPD, CAD, HTN, CKD IV, diastolic HF, polymyalgia rheumatica, Chronic hip pain secondary to L5-S1 spondylosis, right trochanteric bursitis (admitted 03/14/17 for hip pain; steroid injection on 03/24/17 with Dr. Ninfa Linden). Of note, recently admitted 10/1-10/13 for HCAP and treated with vanco/zosyn.    Clinical Impression  Pt presents with an overall decrease in functional mobility secondary to above. PTA, pt recently d/c from SNF and had been receiving HHPT/OT/RN services, requiring assist for ADLs and amb with RW. Today, pt required min-modA+2 for standing and taking steps to chair; demonstrating increased risk for falls. Pt would benefit from continued acute PT services to maximize functional mobility and independence. Based on current level of functioning, feel pt would benefit from continued SNF-level therapies at d/c.     Follow Up Recommendations SNF;Supervision/Assistance - 24 hour    Equipment Recommendations  Other (comment)(TBD)    Recommendations for Other Services       Precautions / Restrictions Precautions Precautions: Fall Restrictions Weight Bearing Restrictions: No      Mobility  Bed Mobility Overal bed mobility: Needs Assistance Bed Mobility: Rolling;Sidelying to Sit Rolling: Supervision Sidelying to sit: Supervision       General bed mobility comments: Increased verbal cues to initiate task  Transfers Overall transfer level: Needs assistance Equipment used: Rolling walker (2 wheeled) Transfers: Sit to/from [facility] Sit to Stand: Min assist;Mod assist;+2 safety/equipment Stand pivot transfers: Min assist;+2 safety/equipment       General  transfer comment: Min assist +2 to power up for initial sit<>stand with RW. Mod assist +2 when attempted without RW for pericare.   Ambulation/Gait Ambulation/Gait assistance: Min assist;+2 physical assistance Ambulation Distance (Feet): 3 Feet Assistive device: Rolling walker (2 wheeled) Gait Pattern/deviations: Step-to pattern;Shuffle;Trunk flexed Gait velocity: Decreased Gait velocity interpretation: <1.8 ft/sec, indicative of risk for recurrent falls General Gait Details: Able to take slow, shuffling steps from bed to chair with RW and minA+2 for balance; cues for upright posture and to cue bilateral hip extension.   Stairs            Wheelchair Mobility    Modified Rankin (Stroke Patients Only)       Balance Overall balance assessment: Needs assistance Sitting-balance support: No upper extremity supported;Feet supported Sitting balance-Leahy Scale: Fair     Standing balance support: Bilateral upper extremity supported;During functional activity Standing balance-Leahy Scale: Poor Standing balance comment: Reliant on BUE support and external assistance.                              Pertinent Vitals/Pain Pain Assessment: Faces Faces Pain Scale: No hurt Pain Intervention(s): Monitored during session    Home Living Family/patient expects to be discharged to:: Private residence Living Arrangements: Children(Daughter) Available Help at Discharge: Family Type of Home: House Home Access: Stairs to enter Entrance Stairs-Rails: Right;Left;Can reach both Entrance Stairs-Number of Steps: 4 Home Layout: Two level;Able to live on main level with bedroom/bathroom Home Equipment: Gilford Rile - 2 wheels;Shower seat;Adaptive equipment;Hospital bed;Other (comment)(Overhead trapeze bar)      Prior Function Level of Independence: Needs assistance   Gait / Transfers Assistance Needed: Using RW for mobility.   ADL's / Homemaking Assistance Needed: Daughter assisting  with  shower transfers. Per pt she is able to complete dressing and bathing tasks without physical assistance. Need to confirm with family.         Hand Dominance        Extremity/Trunk Assessment   Upper Extremity Assessment Upper Extremity Assessment: Generalized weakness    Lower Extremity Assessment Lower Extremity Assessment: Generalized weakness       Communication   Communication: No difficulties  Cognition Arousal/Alertness: Awake/alert Behavior During Therapy: WFL for tasks assessed/performed Overall Cognitive Status: No family/caregiver present to determine baseline cognitive functioning Area of Impairment: Attention;Memory;Following commands;Safety/judgement;Awareness;Problem solving                   Current Attention Level: Selective Memory: Decreased short-term memory Following Commands: Follows one step commands with increased time Safety/Judgement: Decreased awareness of safety;Decreased awareness of deficits Awareness: Intellectual Problem Solving: Slow processing General Comments: Pt pleasant and able to follow commands for simple ADL tasks. Requires increased processing time.       General Comments General comments (skin integrity, edema, etc.): SpO2 remained >95% on 3L O2 [place] while seated EOB. Able to maintain >92% on RA throughout activity. Left supplemental O2 off at end of session secondary to this    Exercises     Assessment/Plan    PT Assessment Patient needs continued PT services  PT Problem List Decreased strength;Decreased activity tolerance;Decreased balance;Decreased mobility;Decreased cognition;Decreased knowledge of use of DME;Decreased safety awareness;Cardiopulmonary status limiting activity       PT Treatment Interventions DME instruction;Gait training;Stair training;Functional mobility training;Therapeutic activities;Therapeutic exercise;Balance training;Patient/family education    PT Goals (Current goals can be found in the Care  Plan section)  Acute Rehab PT Goals Patient Stated Goal: did not state    Frequency Min 2X/week   Barriers to discharge        Co-evaluation PT/OT/SLP Co-Evaluation/Treatment: Yes Reason for Co-Treatment: For patient/therapist safety;To address functional/ADL transfers PT goals addressed during session: Mobility/safety with mobility;Balance OT goals addressed during session: ADL's and self-care       AM-PAC PT "6 Clicks" Daily Activity  Outcome Measure Difficulty turning over in bed (including adjusting bedclothes, sheets and blankets)?: None Difficulty moving from lying on back to sitting on the side of the bed? : None Difficulty sitting down on and standing up from a chair with arms (e.g., wheelchair, bedside commode, etc,.)?: Unable Help needed moving to and from a bed to chair (including a wheelchair)?: A Little Help needed walking in hospital room?: A Lot Help needed climbing 3-5 steps with a railing? : A Lot 6 Click Score: 16    End of Session Equipment Utilized During Treatment: Gait belt Activity Tolerance: Patient tolerated treatment well;Patient limited by fatigue Patient left: in chair;with call bell/phone within reach Nurse Communication: Mobility status PT Visit Diagnosis: Other abnormalities of gait and mobility (R26.89);Muscle weakness (generalized) (M62.81)    Time: 8099-8338 PT Time Calculation (min) (ACUTE ONLY): 26 min   Charges:   PT Evaluation $PT Eval Moderate Complexity: 1 Mod     PT G Codes:       Mabeline Caras, PT, DPT Acute Rehab Services  Pager: [place] 04/26/2017, 4:17 PM

## 2017-04-26 NOTE — Progress Notes (Signed)
PROGRESS NOTE    Rebecca [name]  RKY:[id number] DOB: 1940/12/03 DOA: 04/24/2017 PCP: [name], MD    Brief Narrative:  76 y.o. female with medical history significant of COPD, PMR with chronic prednisone therapy, CAD status post DES to RCA in 2006, CKD stage IV, diastolic heart failure, MGUS, hypothyroidism who was most recently admitted at [facility] long hospital secondary to right-sided trochanteric bursitis, acute on chronic diastolic heart failure and healthcare acquired pneumonia.  Presented to the hospital with lethargy and fevers.   Assessment & Plan:   Sepsis secondary to HCAP -Recently admitted 10/1-10/13 for HCAP and treated with vanco/zosyn, discharged on Augmentin  - Awaiting speech therapy as aspiration is suspected. -Continue vanco, zosyn  -Blood culture pending  -Check respiratory PCR   Acute hypoxemic respiratory failure -Secondary to above, continue supplemental O2  as needed.  Interstitial cystitis  -Previous cultures positive for pseudomonas -ID evaluated patient previous admission and determined her to have colonization of pseudomonas. No antibiotic treatment unless clearly symptomatic. Patient had been started on ceftin as outpatient last week due to dysuria complaint. On zosyn as above.   Chronic hip pain secondary to L5-S1 spondylosis, right trochanteric bursitis  -Seen by ortho (Dr. Ninfa Linden) previous admission and underwent intra-articular steroid injection 03/24/2017   CAD - Will continue Aspirin, plavix, imdur   COPD -Continue nebs, no wheezes as such not increase steroid (from chronic dose)  PMR -Chronic prednisone 10mg  daily, recently started on colchicine by Rheumatologist   MGUS -Follows with [name]   HTN  -Metoprolol, cardizem - hold due to low BP in ED   Chronic diastolic CHF -EF 31%. Appears dehydrated   Hypothyroidism -Continue synthroid  Hyponatremia -Previously seen by nephrology, likely combination of low  solute intake and hypovolemic hyponatremia at that time -IVF, trend BMP   AKI on CKD stage 4  -IVF   GERD - Stable, Continue PPI   HLD -Continue pravachol   Schizoaffective disorder -Continue clozaril    DVT prophylaxis: Heparin Code Status: Full Family Communication: d/c family at bedside. Disposition Plan:  Continue step down for now.   Consultants:   None   Procedures: none   Antimicrobials: Zosyn and Vancomycin   Subjective: Pt has no new complaints no acute issues overnight. States that breathing condition is improved but not at baseline.  Objective: Vitals:   04/26/17 0328 04/26/17 0724 04/26/17 0808 04/26/17 0809  BP: (!) 131/56 (!) 157/88    Pulse: 91 (!) 104    Resp: 15 17    Temp: 98.5 F (36.9 C) 98.3 F (36.8 C)    TempSrc: Axillary Oral    SpO2: 100% 92% 95% 100%  Weight:      Height:        Intake/Output Summary (Last 24 hours) at 04/26/2017 1039 Last data filed at 04/26/2017 1000 Gross per 24 hour  Intake 2100 ml  Output -  Net 2100 ml   Filed Weights   04/24/17 1426  Weight: 59.9 kg (132 lb)    Examination:  General exam: Appears calm and comfortable, in nad. Respiratory system: equal chest rise, rhales, no wheezes Cardiovascular system: S1 & S2 heard, RRR. No JVD, murmurs, rubs, gallops or clicks. No pedal edema. Gastrointestinal system: Abdomen is nondistended, soft and nontender. No organomegaly or masses felt. Normal bowel sounds heard. Central nervous system: Alert and oriented. No facial asymmetry Extremities: Warm + pulses Skin: No rashes, lesions or ulcers, on limited exam. Psychiatry: Mood & affect appropriate.   Data Reviewed:  I have personally reviewed following labs and imaging studies  CBC: Recent Labs  Lab 04/24/17 1501 04/25/17 0306 04/26/17 0216  WBC 26.6* 23.9* 19.3*  NEUTROABS 24.2*  --   --   HGB 11.1* 10.7* 7.5*  HCT 32.3* 31.1* 21.0*  MCV 88.5 88.6 89.7  PLT 239 187 546   Basic Metabolic  Panel: Recent Labs  Lab 04/24/17 1501 04/25/17 0306 04/26/17 0216  NA 125* 127* 131*  K 4.3 4.6 3.9  CL 90* 93* 105  CO2 23 22 17*  GLUCOSE 177* 174* 161*  BUN 71* 84* 89*  CREATININE 2.22* 2.71* 2.99*  CALCIUM 9.3 9.1 7.6*   GFR: Estimated Creatinine Clearance: 12.8 mL/min (A) (by C-G formula based on SCr of 2.99 mg/dL (H)). Liver Function Tests: Recent Labs  Lab 04/24/17 1501  AST 18  ALT 17  ALKPHOS 40  BILITOT 0.7  PROT 5.8*  ALBUMIN 3.3*   No results for input(s): LIPASE, AMYLASE in the last 168 hours. No results for input(s): AMMONIA in the last 168 hours. Coagulation Profile: No results for input(s): INR, PROTIME in the last 168 hours. Cardiac Enzymes: No results for input(s): CKTOTAL, [name], [name], TROPONINI in the last 168 hours. BNP (last 3 results) Recent Labs    03/29/17 1646  PROBNP 84.0   HbA1C: No results for input(s): HGBA1C in the last 72 hours. CBG: No results for input(s): GLUCAP in the last 168 hours. Lipid Profile: No results for input(s): CHOL, [name], [name], [name], [name], LDLDIRECT in the last 72 hours. Thyroid Function Tests: No results for input(s): TSH, T4TOTAL, FREET4, T3FREE, THYROIDAB in the last 72 hours. Anemia Panel: Recent Labs    04/25/17 1022  VITAMINB12 475   Sepsis Labs: Recent Labs  Lab 04/24/17 1521 04/24/17 1849  LATICACIDVEN 1.93* 2.19*    Recent Results (from the past 240 hour(s))  Blood Culture (routine x 2)     Status: None (Preliminary result)   Collection Time: 04/24/17  2:40 PM  Result Value Ref Range Status   Specimen Description BLOOD SITE NOT SPECIFIED  Final   Special Requests   Final    BOTTLES DRAWN AEROBIC AND ANAEROBIC Blood Culture results may not be optimal due to an inadequate volume of blood received in culture bottles   Culture NO GROWTH 2 DAYS  Final   Report Status PENDING  Incomplete  Blood Culture (routine x 2)     Status: None (Preliminary result)   Collection Time: 04/24/17   2:57 PM  Result Value Ref Range Status   Specimen Description BLOOD RIGHT ANTECUBITAL  Final   Special Requests   Final    BOTTLES DRAWN AEROBIC AND ANAEROBIC Blood Culture results may not be optimal due to an excessive volume of blood received in culture bottles   Culture NO GROWTH 2 DAYS  Final   Report Status PENDING  Incomplete  Urine culture     Status: Abnormal   Collection Time: 04/24/17  3:01 PM  Result Value Ref Range Status   Specimen Description URINE, CATHETERIZED  Final   Special Requests NONE  Final   Culture >=100,000 COLONIES/mL ENTEROBACTER AEROGENES (A)  Final   Report Status 04/26/2017 FINAL  Final   Organism ID, Bacteria ENTEROBACTER AEROGENES (A)  Final      Susceptibility   Enterobacter aerogenes - MIC*    CEFAZOLIN >=64 RESISTANT Resistant     CEFTRIAXONE >=64 RESISTANT Resistant     CIPROFLOXACIN <=0.25 SENSITIVE Sensitive     GENTAMICIN <=1 SENSITIVE  Sensitive     IMIPENEM 1 SENSITIVE Sensitive     NITROFURANTOIN 32 SENSITIVE Sensitive     TRIMETH/SULFA <=20 SENSITIVE Sensitive     PIP/TAZO >=128 RESISTANT Resistant     * >=100,000 COLONIES/mL ENTEROBACTER AEROGENES  Respiratory Panel by PCR     Status: None   Collection Time: 04/25/17 12:23 AM  Result Value Ref Range Status   Adenovirus NOT DETECTED NOT DETECTED Final   Coronavirus 229E NOT DETECTED NOT DETECTED Final   Coronavirus HKU1 NOT DETECTED NOT DETECTED Final   Coronavirus NL63 NOT DETECTED NOT DETECTED Final   Coronavirus OC43 NOT DETECTED NOT DETECTED Final   Metapneumovirus NOT DETECTED NOT DETECTED Final   Rhinovirus / Enterovirus NOT DETECTED NOT DETECTED Final   Influenza A NOT DETECTED NOT DETECTED Final   Influenza B NOT DETECTED NOT DETECTED Final   Parainfluenza Virus 1 NOT DETECTED NOT DETECTED Final   Parainfluenza Virus 2 NOT DETECTED NOT DETECTED Final   Parainfluenza Virus 3 NOT DETECTED NOT DETECTED Final   Parainfluenza Virus 4 NOT DETECTED NOT DETECTED Final   Respiratory  Syncytial Virus NOT DETECTED NOT DETECTED Final   Bordetella pertussis NOT DETECTED NOT DETECTED Final   Chlamydophila pneumoniae NOT DETECTED NOT DETECTED Final   Mycoplasma pneumoniae NOT DETECTED NOT DETECTED Final  MRSA PCR Screening     Status: None   Collection Time: 04/25/17 12:23 AM  Result Value Ref Range Status   MRSA by PCR NEGATIVE NEGATIVE Final    Comment:        The GeneXpert MRSA Assay (FDA approved for NASAL specimens only), is one component of a comprehensive MRSA colonization surveillance program. It is not intended to diagnose MRSA infection nor to guide or monitor treatment for MRSA infections.   Culture, sputum-assessment     Status: None   Collection Time: 04/25/17  6:50 AM  Result Value Ref Range Status   Specimen Description SPUTUM  Final   Special Requests NONE  Final   Sputum evaluation THIS SPECIMEN IS ACCEPTABLE FOR SPUTUM CULTURE  Final   Report Status 04/25/2017 FINAL  Final  Culture, respiratory (NON-Expectorated)     Status: None (Preliminary result)   Collection Time: 04/25/17  6:50 AM  Result Value Ref Range Status   Specimen Description SPUTUM  Final   Special Requests NONE Reflexed from [id number]  Final   Gram Stain   Final    ABUNDANT WBC PRESENT, PREDOMINANTLY PMN FEW SQUAMOUS EPITHELIAL CELLS PRESENT FEW GRAM POSITIVE COCCI IN PAIRS IN CHAINS RARE GRAM NEGATIVE RODS RARE BUDDING YEAST SEEN    Culture PENDING  Incomplete   Report Status PENDING  Incomplete         Radiology Studies: Dg Chest Portable 1 View  Result Date: 04/24/2017 CLINICAL DATA:  Altered mental status. EXAM: PORTABLE CHEST 1 VIEW COMPARISON:  CT of the chest 03/21/2017 FINDINGS: Enlarged cardiac silhouette. Calcific atherosclerotic disease and tortuosity of the aorta. Left lower lobe airspace consolidation and moderate left pleural effusion. Low lung volumes. Osseous structures are without acute abnormality. Soft tissues are grossly normal. IMPRESSION: Left lower  lobe airspace consolidation/ pleural effusion. Electronically Signed   By: Fidela Salisbury M.D.   On: 04/24/2017 15:34        Scheduled Meds: . clopidogrel  75 mg Oral QHS  . cloZAPine  300 mg Oral QHS  . colchicine  0.6 mg Oral QHS  . dexamethasone  10 mg Intravenous Q24H  . guaiFENesin  400 mg Oral TID  .  heparin  5,000 Units Subcutaneous Q8H  . ipratropium-albuterol  3 mL Inhalation TID  . isosorbide mononitrate  60 mg Oral Daily  . levothyroxine  37.5 mcg Intravenous QAC breakfast  . mometasone-formoterol  2 puff Inhalation BID  . pantoprazole (PROTONIX) IV  40 mg Intravenous Q24H  . polyethylene glycol  17 g Oral BID  . senna-docusate  2 tablet Oral QHS  . tamsulosin  0.4 mg Oral Q breakfast   Continuous Infusions: . sodium chloride 100 mL/hr at 04/26/17 0909  . piperacillin-tazobactam (ZOSYN)  IV Stopped (04/26/17 1410)  . vancomycin       LOS: 2 days    Time spent: > 35 minutes  Velvet Bathe, MD [facility] Pager [phone]  If 7PM-7AM, please contact night-coverage [email or web address] Password [email or web address] 04/26/2017, 10:39 AM

## 2017-04-26 NOTE — Plan of Care (Signed)
Pt now asking to be turned when feeling uncomfortable. Will continue to turn every two hours.

## 2017-04-26 NOTE — Progress Notes (Addendum)
As endorsed pt with no urine output the whole night. abd distended and firm. Bladder scan done- 665ml volume. MD aware with order. IN and out cath done obtained 909 ml urine , dark amber slightly cloudy , no odor noted. Pt. Tolerated well.

## 2017-04-26 NOTE — Progress Notes (Signed)
ANTIBIOTIC CONSULT NOTE - INITIAL  Pharmacy Consult for Merrem Indication: UTI  Allergies  Allergen Reactions  . Levaquin [Levofloxacin] Other (See Comments)    Ruptured tendon  . Tape Other (See Comments)    PATIENT'S SKIN IS VERY, VERY THIN AND WILL TEAR AND BRUISE EASILY; PLEASE USE AN ALTERNATIVE (SOMETHING OTHER THAN TAPE)    Patient Measurements: Height: 5\' 3"  (160 cm) Weight: 132 lb (59.9 kg) IBW/kg (Calculated) : 52.4 Adjusted Body Weight:    Vital Signs: Temp: 98.8 F (37.1 C) (11/13 1200) Temp Source: Oral (11/13 1200) BP: 157/88 (11/13 0724) Pulse Rate: 104 (11/13 0724) Intake/Output from previous day: 11/12 0701 - 11/13 0700 In: 2100 [I.V.:2000; IV Piggyback:100] Out: -  Intake/Output from this shift: Total I/O In: 800 [P.O.:100; I.V.:500; IV Piggyback:200] Out: -   Labs: Recent Labs    04/24/17 1501 04/25/17 0306 04/26/17 0216  WBC 26.6* 23.9* 19.3*  HGB 11.1* 10.7* 7.5*  PLT 239 187 169  CREATININE 2.22* 2.71* 2.99*   Estimated Creatinine Clearance: 12.8 mL/min (A) (by C-G formula based on SCr of 2.99 mg/dL (H)). No results for input(s): VANCOTROUGH, VANCOPEAK, VANCORANDOM, GENTTROUGH, GENTPEAK, GENTRANDOM, TOBRATROUGH, TOBRAPEAK, TOBRARND, AMIKACINPEAK, AMIKACINTROU, AMIKACIN in the last 72 hours.   Microbiology:   Medical History: Past Medical History:  Diagnosis Date  . AKI (acute kidney injury) ([place])   . Anemia in chronic renal disease   . Aortic atherosclerosis ([place]) 01/27/2017  . CAD (coronary artery disease) 2006   a. s/p DES to RCA in 2006 b. low-risk NST in 2014 c. cath in 08/2015 showing patent RCA stent with nonobstructive disease  . CKD (chronic kidney disease) stage 4, GFR 15-29 ml/min ([facility])   . Complication of anesthesia   . COPD (chronic obstructive pulmonary disease) ([place])   . Diabetes mellitus   . Diastolic heart failure, NYHA class 1 ([place])   . Dyslipidemia   . Dysuria 01/06/2015  . Frozen shoulder    right  .  Gastroesophageal reflux   . HAP (hospital-acquired pneumonia)   . Headache   . Hyperkalemia 01/01/2016  . Hypothyroidism   . 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 Obesity   . Pancreatic mass 01/27/2017  . Pneumonia 06/2016  . Polymyalgia rheumatica ([place])   . PONV (postoperative nausea and vomiting)   . Renal insufficiency   . Respiratory distress   . Schizoaffective disorder ([place])   . Sepsis due to pneumonia ([place])   . Shortness of breath   . Stroke [facility])     Assessment:  ID: Ceftin PTA for dysuria ,recent HCAP. Start abx for HCAP with likely aspiration.  -WBC= 19.3 down, Afebrile, SCr= 2.99 up today (last was 1.19 in 03/2017), CrCl ~ 12. Paged Dr. Wendee Beavers about UCx sensitivities. - Note: h/o interstitial cystitis with previous cx for PSA. ID evaluated patient previous admission and determined her to have colonization of pseudomonas. No antibiotic treatment unless clearly symptomatic  - Merrem 11/13>> - Vanco 11/11>> - Cefepime 11/11>>11/11 - Zosyn 11/11>>11/13  11/12: Respiratory panel: negative 11/12: MRSA PCR negative. 11/12: Sputum: GPC, GNR, yeast 11/11: BC x 2>> 11/11: UCx Enterobacter R to Zosyn  Goal of Therapy:  Eradication of infection  Plan:  Paged Dr. Wendee Beavers about Ucx sensitivities. Ok to change to [employer] (formulary) Merrem 1g IV q24h  [employer]. Alford Highland, PharmD, [place] Clinical Staff Pharmacist Pager [phone]  Eilene Ghazi Stillinger 04/26/2017,1:29 PM

## 2017-04-26 NOTE — Progress Notes (Signed)
  Speech Language Pathology Treatment: Dysphagia  Patient Details Name: Rebecca [name] MRN: [id number] DOB: 21-Jan-1941 Today's Date: 04/26/2017 Time: 3382-5053 SLP Time Calculation (min) (ACUTE ONLY): 24 min  Assessment / Plan / Recommendation Clinical Impression  Pt alert this morning with daughter at bedside. Pt is not vomiting this morning rather baseline cough, congestion (suspect aspiration with vomiting episodes at home) and expectoration or mucous, mostly clear suspected due to her esophageal dysmotility. Reports mild odynophagia during swallow likely from repetitive vomiting yesterday. Cup sips thin water and pudding consumed with several delayed throat clears suspected due to pharyngeal irritation. Venturi mask doffed for most of session and RN will change to nasal cannula. Pt improving and recommend initiate Dys 1 (puree- baseline texture at home) and thin liquids, no straws, pills whole in applesauce and continued ST. No objective testing is warranted at present. Recommend remain in upright posture after meals min 45 minutes. ST will continue to follow.    HPI HPI: Rebecca [name] a 76 y.o.femalewith medical history significant ofCOPD,PMR, CAD, CVA, pna, GERD, pancreatic mass, barium esophagram revealing mild esophageal dysmotility, CKD stage IV, diastolic heart failure, MGUS, hypothyroidism who was most recently admitted at [facility] secondary to right-sided trochanteric bursitis, acute on chronic diastolic heart failure and healthcare acquired pneumonia. This admission found to have healthcare associated pna). MBS 09/2016 reveaked suspected primary esophageal deficits, pharyngeal residue, trace penetration thin to cords silent while consuming barium pill. Dys 3, thin recommended.       SLP Plan  Continue with current plan of care       Recommendations  Diet recommendations: Dysphagia 1 (puree);Thin liquid Liquids provided via: Cup;No straw Medication Administration:  Whole meds with puree Supervision: Patient able to self feed;Intermittent supervision to cue for compensatory strategies Compensations: Slow rate;Small sips/bites Postural Changes and/or Swallow Maneuvers: Seated upright 90 degrees;Upright 30-60 min after meal                Oral Care Recommendations: Oral care BID Follow up Recommendations: None SLP Visit Diagnosis: Dysphagia, unspecified (R13.10) Plan: Continue with current plan of care                       [name] 04/26/2017, 9:27 AM  Orbie Pyo Colvin Caroli.Ed [employer] [phone]

## 2017-04-26 NOTE — Consult Note (Signed)
   [facility] CM Inpatient Consult   04/26/2017  MEGHANN LANDING [birth year] [phone]  Patient is currently active with [facility] for chronic disease management services in the Medicare ACO.  Patient has been engaged by a [facility] CSW patient had recently discharged from a skilled facility.  Will follow for progress and disposition needs.  Patient currently at step down level of care. Of note, [facility] Care Management services does not replace or interfere with any services that are needed or arranged by inpatient case management or social work.  For additional questions or referrals please contact:  Natividad Brood, RN BSN [facility] Liaison  [phone] business mobile phone Toll free office [phone]

## 2017-04-26 NOTE — Progress Notes (Signed)
Started with pureed diet , noted to be coughing once in a while. Instructed  daughter to always  to have pt in upright position while eating and eat slowly to avoid aspiration. Continue to monitor.

## 2017-04-26 NOTE — Evaluation (Signed)
Occupational Therapy Evaluation Patient Details Name: Rebecca [name] MRN: [id number] DOB: 06-22-1940 Today's Date: 04/26/2017    History of Present Illness Pt is a 76 y.o. female admitted on 04/24/17 with workup for sepsis secondary to HCAP and acute hypoxemic respiratory failure. Pertinent PMH includes COPD, CAD, HTN, CKD IV, diastolic HF, polymyalgia rheumatica, Chronic hip pain secondary to L5-S1 spondylosis, right trochanteric bursitis (admitted 03/14/17 for hip pain; steroid injection on 03/24/17 with Dr. Ninfa Linden). Of note, recently admitted 10/1-10/13 for HCAP and treated with vanco/zosyn.   Clinical Impression   PTA, pt requiring assistance for shower transfers, was ambulating with RW, and was current with [facility] RN, OT, and PT services. She currently requires min guard assist for seated UB ADL, min-mod assist +2 for stand-pivot toilet transfers, and min assist for LB ADL. Pt would benefit from continued OT services while admitted to improve independence and safety with ADL and functional mobility. At current functional level, recommend SNF level rehabilitation to maximize independence with ADL prior to returning home. OT will continue to follow while admitted.     Follow Up Recommendations  SNF;Supervision/Assistance - 24 hour    Equipment Recommendations  Other (comment)(TBD at next venue of care)    Recommendations for Other Services       Precautions / Restrictions Restrictions Weight Bearing Restrictions: No      Mobility Bed Mobility Overal bed mobility: Needs Assistance Bed Mobility: Rolling;Sidelying to Sit Rolling: Supervision Sidelying to sit: Supervision       General bed mobility comments: VC's to initiate task.   Transfers Overall transfer level: Needs assistance Equipment used: Rolling walker (2 wheeled) Transfers: Sit to/from [facility] Sit to Stand: Min assist;Mod assist;+2 safety/equipment Stand pivot transfers: Min assist;+2  safety/equipment       General transfer comment: Min assist +2 to power up for initial sit<>stand with RW. Mod assist +2 when attempted without RW for pericare.     Balance Overall balance assessment: Needs assistance Sitting-balance support: No upper extremity supported;Feet supported Sitting balance-Leahy Scale: Fair     Standing balance support: Bilateral upper extremity supported;During functional activity Standing balance-Leahy Scale: Poor Standing balance comment: Reliant on B UE support and external assistance.                            ADL either performed or assessed with clinical judgement   ADL Overall ADL's : Needs assistance/impaired Eating/Feeding: Supervision/ safety;Sitting   Grooming: Sitting;Supervision/safety   Upper Body Bathing: Minimal assistance;Sitting   Lower Body Bathing: Minimal assistance;Sit to/from stand   Upper Body Dressing : Minimal assistance;Sitting   Lower Body Dressing: Minimal assistance;Sit to/from stand   Toilet Transfer: Minimal assistance;Ambulation;RW;+2 for safety/equipment;Moderate assistance Toilet Transfer Details (indicate cue type and reason): Min assist +2 to stand with RW. Attempted stand without AD for pericare and requiring mod assist +2.  Toileting- Clothing Manipulation and Hygiene: Maximal assistance;Sit to/from stand;+2 for safety/equipment       Functional mobility during ADLs: Minimal assistance;Moderate assistance;Rolling walker General ADL Comments: Pt pleasantly confused throughout session. Benefits from RW for stability.      Vision Patient Visual Report: No change from baseline Additional Comments: Need to further assess.      Perception     Praxis      Pertinent Vitals/Pain Pain Assessment: Faces Faces Pain Scale: No hurt Pain Intervention(s): Monitored during session     Hand Dominance     Extremity/Trunk Assessment Upper Extremity Assessment  Upper Extremity Assessment:  Generalized weakness   Lower Extremity Assessment Lower Extremity Assessment: Generalized weakness       Communication Communication Communication: No difficulties   Cognition Arousal/Alertness: Awake/alert Behavior During Therapy: WFL for tasks assessed/performed Overall Cognitive Status: No family/caregiver present to determine baseline cognitive functioning Area of Impairment: Attention;Memory;Following commands;Safety/judgement;Awareness;Problem solving                   Current Attention Level: Selective Memory: Decreased short-term memory Following Commands: Follows one step commands with increased time Safety/Judgement: Decreased awareness of safety;Decreased awareness of deficits Awareness: Intellectual Problem Solving: Slow processing General Comments: Pt pleasant and able to follow commands for simple ADL tasks. Requires increased processing time.    General Comments  Pt with O2 saturation >95% on 3L O2 via [place] while seated at EOB. Removed supplemental O2 and able to maintain saturations >92% throughout activity. Left supplemental O2 off at end of session.     Exercises     Shoulder Instructions      Home Living Family/patient expects to be discharged to:: Private residence Living Arrangements: Children Available Help at Discharge: Family Type of Home: House Home Access: Stairs to enter [employer] of Steps: 4   Home Layout: Two level;Able to live on main level with bedroom/bathroom     Bathroom Shower/Tub: Occupational psychologist: Standard     Home Equipment: Environmental consultant - 2 wheels;Shower seat;Adaptive equipment;Hospital bed(trapeze bar over hospital bed)          Prior Functioning/Environment Level of Independence: Needs assistance  Gait / Transfers Assistance Needed: Using RW for mobility.  ADL's / Homemaking Assistance Needed: Daughter assisting with shower transfers. Per pt she is able to complete dressing and bathing tasks  without physical assistance. Need to confirm with family.             OT Problem List: Decreased strength;Decreased activity tolerance;Impaired balance (sitting and/or standing);Decreased safety awareness;Decreased knowledge of use of DME or AE;Decreased knowledge of precautions;Pain;Decreased cognition      OT Treatment/Interventions: Self-care/ADL training;Therapeutic exercise;Energy conservation;DME and/or AE instruction;Therapeutic activities;Patient/family education;Balance training;Cognitive remediation/compensation    OT Goals(Current goals can be found in the care plan section) Acute Rehab OT Goals Patient Stated Goal: did not state OT Goal Formulation: With patient Time For Goal Achievement: 05/10/17 Potential to Achieve Goals: Good  OT Frequency: Min 2X/week   Barriers to D/C:            Co-evaluation PT/OT/SLP Co-Evaluation/Treatment: Yes Reason for Co-Treatment: For patient/therapist safety;To address functional/ADL transfers   OT goals addressed during session: ADL's and self-care      AM-PAC PT "6 Clicks" Daily Activity     Outcome Measure Help from another person eating meals?: A Little Help from another person taking care of personal grooming?: A Little Help from another person toileting, which includes using toliet, bedpan, or urinal?: A Little Help from another person bathing (including washing, rinsing, drying)?: A Little Help from another person to put on and taking off regular upper body clothing?: A Little Help from another person to put on and taking off regular lower body clothing?: A Little 6 Click Score: 18   End of Session Equipment Utilized During Treatment: Gait belt;Rolling walker Nurse Communication: Mobility status  Activity Tolerance: Patient tolerated treatment well Patient left: in chair;with call bell/phone within reach;with family/visitor present  OT Visit Diagnosis: Other abnormalities of gait and mobility (R26.89);Muscle weakness  (generalized) (M62.81);Other symptoms and signs involving cognitive function  Time: 3845-3646 OT Time Calculation (min): 26 min Charges:  OT General Charges $OT Visit: 1 Visit OT Evaluation $OT Eval Moderate Complexity: 1 Mod G-Codes:     Norman Herrlich, MS OTR/L  Pager: [place] 04/26/2017, 3:29 PM

## 2017-04-27 ENCOUNTER — Inpatient Hospital Stay (HOSPITAL_COMMUNITY): Payer: Medicare Other

## 2017-04-27 DIAGNOSIS — M353 Polymyalgia rheumatica: Secondary | ICD-10-CM

## 2017-04-27 DIAGNOSIS — F259 Schizoaffective disorder, unspecified: Secondary | ICD-10-CM

## 2017-04-27 DIAGNOSIS — J690 Pneumonitis due to inhalation of food and vomit: Secondary | ICD-10-CM

## 2017-04-27 DIAGNOSIS — I10 Essential (primary) hypertension: Secondary | ICD-10-CM

## 2017-04-27 DIAGNOSIS — E039 Hypothyroidism, unspecified: Secondary | ICD-10-CM

## 2017-04-27 DIAGNOSIS — J181 Lobar pneumonia, unspecified organism: Secondary | ICD-10-CM

## 2017-04-27 DIAGNOSIS — R1110 Vomiting, unspecified: Secondary | ICD-10-CM

## 2017-04-27 DIAGNOSIS — R1310 Dysphagia, unspecified: Secondary | ICD-10-CM

## 2017-04-27 LAB — BASIC METABOLIC PANEL
Anion gap: 12 (ref 5–15)
BUN: 79 mg/dL — AB (ref 6–20)
CHLORIDE: 108 mmol/L (ref 101–111)
CO2: 16 mmol/L — ABNORMAL LOW (ref 22–32)
CREATININE: 2.6 mg/dL — AB (ref 0.44–1.00)
Calcium: 8.4 mg/dL — ABNORMAL LOW (ref 8.9–10.3)
GFR calc Af Amer: 19 mL/min — ABNORMAL LOW (ref >60)
GFR calc non Af Amer: 17 mL/min — ABNORMAL LOW (ref >60)
GLUCOSE: 179 mg/dL — AB (ref 65–99)
POTASSIUM: 4 mmol/L (ref 3.5–5.1)
Sodium: 136 mmol/L (ref 135–145)

## 2017-04-27 LAB — CBC
HCT: 27.2 % — ABNORMAL LOW (ref 36.0–46.0)
HEMOGLOBIN: 9.4 g/dL — AB (ref 12.0–15.0)
MCH: 30.4 pg (ref 26.0–34.0)
MCHC: 34.6 g/dL (ref 30.0–36.0)
MCV: 88 fL (ref 78.0–100.0)
PLATELETS: 179 10*3/uL (ref 150–400)
RBC: 3.09 MIL/uL — AB (ref 3.87–5.11)
RDW: 14 % (ref 11.5–15.5)
WBC: 25.7 10*3/uL — ABNORMAL HIGH (ref 4.0–10.5)

## 2017-04-27 LAB — CULTURE, RESPIRATORY

## 2017-04-27 LAB — GLUCOSE, CAPILLARY
Glucose-Capillary: 204 mg/dL — ABNORMAL HIGH (ref 65–99)
Glucose-Capillary: 203 mg/dL — ABNORMAL HIGH (ref 65–99)
Glucose-Capillary: 193 mg/dL — ABNORMAL HIGH (ref 65–99)

## 2017-04-27 LAB — CULTURE, RESPIRATORY W GRAM STAIN

## 2017-04-27 MED ORDER — INSULIN ASPART 100 UNIT/ML ~~LOC~~ SOLN
0.0000 [IU] | SUBCUTANEOUS | Status: DC
  Administered 2017-04-27: 2 [IU] via SUBCUTANEOUS
  Administered 2017-04-27 (×2): 3 [IU] via SUBCUTANEOUS
  Administered 2017-04-28: 1 [IU] via SUBCUTANEOUS
  Administered 2017-04-28 (×3): 2 [IU] via SUBCUTANEOUS
  Administered 2017-04-28: 1 [IU] via SUBCUTANEOUS
  Administered 2017-04-28: 2 [IU] via SUBCUTANEOUS

## 2017-04-27 MED ORDER — ASPIRIN 300 MG RE SUPP
300.0000 mg | Freq: Every day | RECTAL | Status: DC
  Administered 2017-04-27 – 2017-04-28 (×2): 300 mg via RECTAL
  Filled 2017-04-27 (×2): qty 1

## 2017-04-27 MED ORDER — LEVOTHYROXINE SODIUM 100 MCG IV SOLR
37.5000 ug | Freq: Every day | INTRAVENOUS | Status: DC
  Administered 2017-04-28: 37.5 ug via INTRAVENOUS
  Filled 2017-04-27: qty 5

## 2017-04-27 MED ORDER — DILTIAZEM HCL 100 MG IV SOLR
5.0000 mg/h | INTRAVENOUS | Status: DC
  Administered 2017-04-27 – 2017-04-29 (×2): 5 mg/h via INTRAVENOUS
  Filled 2017-04-27 (×4): qty 100

## 2017-04-27 MED ORDER — LEVOTHYROXINE SODIUM 75 MCG PO TABS: 75.0000 ug | ORAL_TABLET | Freq: Every day | ORAL | Status: DC

## 2017-04-27 MED ORDER — METOPROLOL TARTRATE 5 MG/5ML IV SOLN
5.0000 mg | Freq: Three times a day (TID) | INTRAVENOUS | Status: DC
  Administered 2017-04-27 – 2017-04-29 (×6): 5 mg via INTRAVENOUS
  Filled 2017-04-27 (×6): qty 5

## 2017-04-27 MED ORDER — SODIUM BICARBONATE 8.4 % IV SOLN
INTRAVENOUS | Status: DC
  Administered 2017-04-27 – 2017-04-28 (×2): via INTRAVENOUS
  Filled 2017-04-27 (×3): qty 150

## 2017-04-27 MED ORDER — BISACODYL 10 MG RE SUPP
10.0000 mg | Freq: Once | RECTAL | Status: AC
  Administered 2017-04-27: 10 mg via RECTAL
  Filled 2017-04-27: qty 1

## 2017-04-27 MED ORDER — PANTOPRAZOLE SODIUM 40 MG IV SOLR
40.0000 mg | INTRAVENOUS | Status: DC
  Administered 2017-04-27 – 2017-05-02 (×6): 40 mg via INTRAVENOUS
  Filled 2017-04-27 (×6): qty 40

## 2017-04-27 NOTE — Progress Notes (Signed)
  Speech Language Pathology Treatment: Dysphagia  Patient Details Name: Rebecca [name] MRN: [id number] DOB: 1940-07-29 Today's Date: 04/27/2017 Time: 1205-1226 SLP Time Calculation (min) (ACUTE ONLY): 21 min  Assessment / Plan / Recommendation Clinical Impression  Pt now exhibiting s/s aspiration indicated by immediate cough following every sip thin water, water with Miralax and nectar thick juice. Increased oral pooling of secretions with frequent expectoration. Daughter and RN reported pt was "unable to swallow" green beans last night and pills this morning. Given these new observations, history of esophageal dysmotility and deconditioning MBS recommended and scheduled for 12:00 today.     HPI HPI: Rebecca A Coxis a 76 y.o.femalewith medical history significant ofCOPD,PMR, CAD, CVA, pna, GERD, pancreatic mass, barium esophagram revealing mild esophageal dysmotility, CKD stage IV, diastolic heart failure, MGUS, hypothyroidism who was most recently admitted at [facility] long hospital secondary to right-sided trochanteric bursitis, acute on chronic diastolic heart failure and healthcare acquired pneumonia. This admission found to have healthcare associated pna). MBS 09/2016 reveaked suspected primary esophageal deficits, pharyngeal residue, trace penetration thin to cords silent while consuming barium pill. Dys 3, thin recommended.       SLP Plan  MBS       Recommendations  Diet recommendations: (defer until MBS completed)                Oral Care Recommendations: Oral care BID Follow up Recommendations: (TBD) SLP Visit Diagnosis: Dysphagia, oropharyngeal phase (R13.12) Plan: MBS       GO                Rebecca [name] 04/27/2017, 12:55 PM  Rebecca [name] Rebecca [name] M.Ed [employer] [phone]

## 2017-04-27 NOTE — Progress Notes (Signed)
[place] for Vanco/Merrem Indication: HCAP with likely aspiration.  + UTI  Allergies  Allergen Reactions  . Levaquin [Levofloxacin] Other (See Comments)    Ruptured tendon  . Tape Other (See Comments)    PATIENT'S SKIN IS VERY, VERY THIN AND WILL TEAR AND BRUISE EASILY; PLEASE USE AN ALTERNATIVE (SOMETHING OTHER THAN TAPE)    Patient Measurements: Height: 5\' 3"  (160 cm) Weight: 132 lb (59.9 kg) IBW/kg (Calculated) : 52.4 Adjusted Body Weight:   Vital Signs: Temp: 98.6 F (37 C) (11/14 0755) Temp Source: Oral (11/14 0755) BP: 152/76 (11/14 0755) Pulse Rate: 101 (11/14 0300) Intake/Output from previous day: 11/13 0701 - 11/14 0700 In: 2120 [P.O.:320; I.V.:1500; IV Piggyback:300] Out: 350 [Urine:350] Intake/Output from this shift: No intake/output data recorded.  Labs: Recent Labs    04/25/17 0306 04/26/17 0216 04/27/17 0703  WBC 23.9* 19.3* 25.7*  HGB 10.7* 7.5* 9.4*  PLT 187 169 179  CREATININE 2.71* 2.99* 2.60*   Estimated Creatinine Clearance: 14.8 mL/min (A) (by C-G formula based on SCr of 2.6 mg/dL (H)). No results for input(s): VANCOTROUGH, VANCOPEAK, VANCORANDOM, GENTTROUGH, GENTPEAK, GENTRANDOM, TOBRATROUGH, TOBRAPEAK, TOBRARND, AMIKACINPEAK, AMIKACINTROU, AMIKACIN in the last 72 hours.   Microbiology:   Medical History: Past Medical History:  Diagnosis Date  . AKI (acute kidney injury) ([place])   . Anemia in chronic renal disease   . Aortic atherosclerosis ([place]) 01/27/2017  . CAD (coronary artery disease) 2006   a. s/p DES to RCA in 2006 b. low-risk NST in 2014 c. cath in 08/2015 showing patent RCA stent with nonobstructive disease  . CKD (chronic kidney disease) stage 4, GFR 15-29 ml/min (HCC)   . Complication of anesthesia   . COPD (chronic obstructive pulmonary disease) ([place])   . Diabetes mellitus   . Diastolic heart failure, NYHA class 1 ([place])   . Dyslipidemia   . Dysuria 01/06/2015  . Frozen shoulder    right   . Gastroesophageal reflux   . HAP (hospital-acquired pneumonia)   . Headache   . Hyperkalemia 01/01/2016  . Hypothyroidism   . 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 Obesity   . Pancreatic mass 01/27/2017  . Pneumonia 06/2016  . Polymyalgia rheumatica ([place])   . PONV (postoperative nausea and vomiting)   . Renal insufficiency   . Respiratory distress   . Schizoaffective disorder ([place])   . Sepsis due to pneumonia ([place])   . Shortness of breath   . Stroke [facility])    Assessment:  ID: Ceftin PTA for dysuria, recent HCAP. Start abx for HCAP with likely aspiration.  -WBC= 19.3>25.7 (chronic steroids) back up, Afebrile, SCr= 2.6 (last was 1.19 in 03/2017), CrCl ~ 14. Paged Dr. Wendee Beavers about UCx sensitivities on 11/13.  - Note: h/o interstitial cystitis with previous cx for PSA. ID evaluated patient previous admission and determined her to have colonization of pseudomonas. No antibiotic treatment unless clearly symptomatic  - Merrem 11/13>> - Vanco 11/11>> - Cefepime 11/11>>11/11 - Zosyn 11/11>>11/13  11/12: Respiratory panel: negative 11/12: MRSA PCR negative. 11/12: Sputum: GPC, GNR, yeast 11/11: BC x 2>> 11/11: UCx Enterobacter R to Zosyn  Goal of Therapy:  Vancomycin trough level 15-20 mcg/ml Eradication of infection   Plan:  - Merrem 1g IV q24h - Vancomycin change to 1g IV q 48h. Had had 2 doses. Trough after 3-5 doses at steady state. - Renally adjust colchicine for now (can change back when renal function improves)  Demarkis Gheen S. Alford Highland, PharmD, BCPS  Clinical Staff Pharmacist Pager [phone]  Eilene Ghazi Stillinger 04/27/2017,11:07 AM

## 2017-04-27 NOTE — Progress Notes (Addendum)
PROGRESS NOTE   Rebecca [name]  JAS:[id number]    DOB: 22-Jun-1940    DOA: 04/24/2017  PCP: [name], MD   I have briefly reviewed patients previous medical records in [facility].  Brief Narrative:  76 year old female with PMH of COPD, not on home oxygen, PMR on chronic prednisone 10 MG daily, CAD status post DES to RCA 2006, stage III CKD, chronic diastolic CHF, MGUS, hypothyroid, recent hospitalization for right trochanteric bursitis, acute on chronic diastolic CHF and HCAP, discharged to SNF and subsequently home, treated for presumed UTI with Ceftin at discharge from SNF, presented with lethargy, fever, cough productive of yellow sputum and vomiting. Admitted for LLL pneumonia suspicious for aspiration pneumonia. Failed swallow evaluation on 11/14. Concern for esophageal dysmotility, less likely obstruction. [facility] consulted. NPO for now.   Assessment & Plan:   Principal Problem:   Aspiration pneumonia ([place]) Active Problems:   Hypothyroidism   Schizoaffective disorder ([place])   GASTROESOPHAGEAL REFLUX, NO ESOPHAGITIS   Polymyalgia rheumatica ([facility])   Essential hypertension   Lobar pneumonia, unspecified organism ([facility])   TIA (transient ischemic attack)   Recurrent UTI   Dysphagia   Sepsis secondary to lobar/aspiration pneumonia - Recently hospitalized 10/1-10/13 for HCAP, treated with vancomycin/Zosyn and discharged on oral Augmentin. - Given history of GERD, dysphagia, on modified diet at home, input by speech therapy (failed swallow evaluation), strongly suspect aspiration pneumonia. - Continue empirically started IV vancomycin and meropenem. - Pulmonary toilet. - Recommend follow-up chest x-ray in 4 weeks to ensure resolution of pneumonia findings. - Blood cultures 2: Negative to date. Sputum culture shows Candida. RSV PCR negative.  Acute respiratory failure with hypoxia - Secondary to pneumonia and underlying COPD. Resolved. Continue to monitor  closely.  Dysphagia/vomiting - Speech therapy input appreciated. Discussed in detail with ST who after MBS indicates high risk of aspiration, food products retained in lower esophagus. - [facility] consulted for possible EGD.? Esophageal dysmotility versus obstruction-less likely. - NPO until GI recommendations. Gentle IV fluids. - Etiology of vomiting may be related to esophageal issues. Obtain KUB which showed some constipation but no ileus or obstruction.  Anemia of chronic disease/chronic kidney disease Stable.   Leukocytosis - Secondary to IV steroids and sepsis. Management as above.   Acute on Stage III chronic kidney disease - Patient had creatinine of 1.19 on 10/13. Presented with creatinine of 2.2 which peaked to 2.9. Likely related to volume depletion. Improved to 2.6. Continue gentle IV fluids and follow BMP in a.m.   Non-anion gap metabolic acidosis - No diarrhea reported. Brief gentle IV bicarbonate infusion and follow BMP in a.m.  CAD - it appears that patient was on Plavix for TIA. On only aspirin when necessary for pain issues. Unable to take oral Plavix due to dysphagia and nothing by mouth. Temporarily rectal aspirin.    TIA - Antiplatelet discussion as above.   COPD - Stable without clinical bronchospasm.   PMR/chronic prednisone - Currently on IV Decadron 10 MG daily. Follows with Dr. Amil Amen, Rheumatology. Recently started on colchicine.  MGUS - Follows with outpatient hematology/oncology.   Essential hypertension - Mildly uncontrolled. Changed oral medications to IV. IV Cardizem infusion at 5 mg and IV metoprolol 5 MG every 8 hours. Change back to oral when able.   Chronic diastolic CHF - Appears mildly volume depleted.   Hypothyroid - IV levothyroxine.   Hyponatremia - Resolved.   GERD - Change PPI to IV.   Schizoaffective disorder - Patient follows with  Dr. Casimiro Needle, Psychiatry. On long-term Clozaril. Some issues with a.m. sedation. Monitor  closely and may have to reduce dose or hold temporarily if sedation becomes an issue.   Interstitial cystitis - Previously cultures positive for Pseudomonas.  - ID evaluated patient on previous admission and determined her to have colonization by Pseudomonas. No antibiotic treatment unless clearly symptomatic. She was on Ceftin as outpatient due to dysuria. Currently on meropenem.  - Urine culture this admission shows Enterobacter aeruginosa sensitive to imipenem.   Chronic hip pain secondary to L5-S1 spondylosis, right trochanteric bursitis  - Seen by Dr. Ninfa Linden, orthopedics on previous admission and underwent intra-articular steroid injection 03/24/17   Adult failure to thrive  - Multifactorial due to advanced age, frail and multiple comorbidities.  - May need to consider new goals of care if not progressively improving   Constipation - Bowel regimen.  Prediabetes - Hemoglobin A1c in August was 6. Now complicated by steroids. SSI.   DVT prophylaxis: Heparin Code Status: Full Family Communication: Discussed in detail with patient's daughter at bedside. Updated care and answered questions.  Disposition: To be determined. May need SNF at discharge.   Consultants:  [facility]  Procedures:  None   Antimicrobials:  IV cefepime 1 dose  IV Meropenum IV Vancomycin   Subjective: Seen this morning. Sleepy. History obtained from daughter at bedside. Reports that she slept up to 4 this morning and then started having episodes of vomiting. Subsequently went back to sleep. No difficulty breathing. Productive cough reported. No BM for 4 days. No flatus.   ROS: As per daughter, issues with reflux over the weekend prior to admission followed by nausea and vomiting.  Objective:  Vitals:   04/27/17 0300 04/27/17 0734 04/27/17 0755 04/27/17 1239  BP: (!) 141/77  (!) 152/76 (!) 169/68  Pulse: (!) 101     Resp: 19     Temp: 98.2 F (36.8 C)  98.6 F (37 C) 98.3 F (36.8 C)   TempSrc: Axillary  Oral Oral  SpO2: 97% 97%    Weight:      Height:        Examination:  General exam: Elderly female, small built and nourished, lying comfortably propped up in bed.  Respiratory system: slightly harsh breath sounds bilaterally. Diminished breath sounds in the bases with occasional crackles. No wheezing or rhonchi. Respiratory effort normal. Saturating in the 90s on room air.  Cardiovascular system: S1 & S2 heard, RRR. No JVD, murmurs, rubs, gallops or clicks. No pedal edema. Telemetry: Sinus tachycardia in the 100s.  Gastrointestinal system: Abdomen is nondistended, soft and nontender. No organomegaly or masses felt. Normal bowel sounds heard. Central nervous system: Sleepy but easily arousable. Mumbles incomprehensively. Suctions herself. No focal neurological deficits. Extremities: Moving all limbs symmetrically.  Skin: No rashes, lesions or ulcers Psychiatry: Judgement and insight impaired. Mood & affect cannot be assessed at this time.     Data Reviewed: I have personally reviewed following labs and imaging studies  CBC: Recent Labs  Lab 04/24/17 1501 04/25/17 0306 04/26/17 0216 04/27/17 0703  WBC 26.6* 23.9* 19.3* 25.7*  NEUTROABS 24.2*  --   --   --   HGB 11.1* 10.7* 7.5* 9.4*  HCT 32.3* 31.1* 21.0* 27.2*  MCV 88.5 88.6 89.7 88.0  PLT 239 187 169 295   Basic Metabolic Panel: Recent Labs  Lab 04/24/17 1501 04/25/17 0306 04/26/17 0216 04/27/17 0703  NA 125* 127* 131* 136  K 4.3 4.6 3.9 4.0  CL 90* 93* 105  108  CO2 23 22 17* 16*  GLUCOSE 177* 174* 161* 179*  BUN 71* 84* 89* 79*  CREATININE 2.22* 2.71* 2.99* 2.60*  CALCIUM 9.3 9.1 7.6* 8.4*   Liver Function Tests: Recent Labs  Lab 04/24/17 1501  AST 18  ALT 17  ALKPHOS 40  BILITOT 0.7  PROT 5.8*  ALBUMIN 3.3*   HbA1C: No results for input(s): HGBA1C in the last 72 hours. CBG: No results for input(s): GLUCAP in the last 168 hours.  Recent Results (from the past 240 hour(s))   Blood Culture (routine x 2)     Status: None (Preliminary result)   Collection Time: 04/24/17  2:40 PM  Result Value Ref Range Status   Specimen Description BLOOD SITE NOT SPECIFIED  Final   Special Requests   Final    BOTTLES DRAWN AEROBIC AND ANAEROBIC Blood Culture results may not be optimal due to an inadequate volume of blood received in culture bottles   Culture NO GROWTH 3 DAYS  Final   Report Status PENDING  Incomplete  Blood Culture (routine x 2)     Status: None (Preliminary result)   Collection Time: 04/24/17  2:57 PM  Result Value Ref Range Status   Specimen Description BLOOD RIGHT ANTECUBITAL  Final   Special Requests   Final    BOTTLES DRAWN AEROBIC AND ANAEROBIC Blood Culture results may not be optimal due to an excessive volume of blood received in culture bottles   Culture NO GROWTH 3 DAYS  Final   Report Status PENDING  Incomplete  Urine culture     Status: Abnormal   Collection Time: 04/24/17  3:01 PM  Result Value Ref Range Status   Specimen Description URINE, CATHETERIZED  Final   Special Requests NONE  Final   Culture >=100,000 COLONIES/mL ENTEROBACTER AEROGENES (A)  Final   Report Status 04/26/2017 FINAL  Final   Organism ID, Bacteria ENTEROBACTER AEROGENES (A)  Final      Susceptibility   Enterobacter aerogenes - MIC*    CEFAZOLIN >=64 RESISTANT Resistant     CEFTRIAXONE >=64 RESISTANT Resistant     CIPROFLOXACIN <=0.25 SENSITIVE Sensitive     GENTAMICIN <=1 SENSITIVE Sensitive     IMIPENEM 1 SENSITIVE Sensitive     NITROFURANTOIN 32 SENSITIVE Sensitive     TRIMETH/SULFA <=20 SENSITIVE Sensitive     PIP/TAZO >=128 RESISTANT Resistant     * >=100,000 COLONIES/mL ENTEROBACTER AEROGENES  Respiratory Panel by PCR     Status: None   Collection Time: 04/25/17 12:23 AM  Result Value Ref Range Status   Adenovirus NOT DETECTED NOT DETECTED Final   Coronavirus 229E NOT DETECTED NOT DETECTED Final   Coronavirus HKU1 NOT DETECTED NOT DETECTED Final   Coronavirus  NL63 NOT DETECTED NOT DETECTED Final   Coronavirus OC43 NOT DETECTED NOT DETECTED Final   Metapneumovirus NOT DETECTED NOT DETECTED Final   Rhinovirus / Enterovirus NOT DETECTED NOT DETECTED Final   Influenza A NOT DETECTED NOT DETECTED Final   Influenza B NOT DETECTED NOT DETECTED Final   Parainfluenza Virus 1 NOT DETECTED NOT DETECTED Final   Parainfluenza Virus 2 NOT DETECTED NOT DETECTED Final   Parainfluenza Virus 3 NOT DETECTED NOT DETECTED Final   Parainfluenza Virus 4 NOT DETECTED NOT DETECTED Final   Respiratory Syncytial Virus NOT DETECTED NOT DETECTED Final   Bordetella pertussis NOT DETECTED NOT DETECTED Final   Chlamydophila pneumoniae NOT DETECTED NOT DETECTED Final   Mycoplasma pneumoniae NOT DETECTED NOT DETECTED Final  MRSA  PCR Screening     Status: None   Collection Time: 04/25/17 12:23 AM  Result Value Ref Range Status   MRSA by PCR NEGATIVE NEGATIVE Final    Comment:        The GeneXpert MRSA Assay (FDA approved for NASAL specimens only), is one component of a comprehensive MRSA colonization surveillance program. It is not intended to diagnose MRSA infection nor to guide or monitor treatment for MRSA infections.   Culture, sputum-assessment     Status: None   Collection Time: 04/25/17  6:50 AM  Result Value Ref Range Status   Specimen Description SPUTUM  Final   Special Requests NONE  Final   Sputum evaluation THIS SPECIMEN IS ACCEPTABLE FOR SPUTUM CULTURE  Final   Report Status 04/25/2017 FINAL  Final  Culture, respiratory (NON-Expectorated)     Status: None   Collection Time: 04/25/17  6:50 AM  Result Value Ref Range Status   Specimen Description SPUTUM  Final   Special Requests NONE Reflexed from [id number]  Final   Gram Stain   Final    ABUNDANT WBC PRESENT, PREDOMINANTLY PMN FEW SQUAMOUS EPITHELIAL CELLS PRESENT FEW GRAM POSITIVE COCCI IN PAIRS IN CHAINS RARE GRAM NEGATIVE RODS RARE BUDDING YEAST SEEN    Culture ABUNDANT CANDIDA ALBICANS  Final    Report Status 04/27/2017 FINAL  Final         Radiology Studies: Dg Abd Portable 1v  Result Date: 04/27/2017 CLINICAL DATA:  Vomiting. EXAM: PORTABLE ABDOMEN - 1 VIEW COMPARISON:  Scout image for CT scan of the abdomen dated 01/16/2017 FINDINGS: There are no dilated loops of large or small bowel. The stomach is not distended. There is extensive stool in the right side of the colon with less prominent stool in the proximal descending colon. No evidence of fecal impaction. Chronic degenerative changes in the lumbar spine. IMPRESSION: No acute abnormality. Moderate stool in the colon, but less prominent than on the prior CT scan. Electronically Signed   By: Lorriane Shire M.D.   On: 04/27/2017 09:59        Scheduled Meds: . aspirin  300 mg Rectal Daily  . bisacodyl  10 mg Rectal Once  . cloZAPine  300 mg Oral QHS  . colchicine  0.3 mg Oral QHS  . dexamethasone  10 mg Intravenous Q24H  . guaiFENesin  400 mg Oral TID  . heparin  5,000 Units Subcutaneous Q8H  . ipratropium-albuterol  3 mL Inhalation TID  . isosorbide mononitrate  60 mg Oral Daily  . levothyroxine  37.5 mcg Intravenous Daily  . metoprolol tartrate  5 mg Intravenous Q8H  . mometasone-formoterol  2 puff Inhalation BID  . pantoprazole (PROTONIX) IV  40 mg Intravenous Q24H  . polyethylene glycol  17 g Oral BID  . senna-docusate  2 tablet Oral QHS  . tamsulosin  0.4 mg Oral Q breakfast   Continuous Infusions: . diltiazem (CARDIZEM) infusion    . meropenem (MERREM) IV Stopped (04/26/17 1619)  .  sodium bicarbonate  infusion 1000 mL    . vancomycin Stopped (04/26/17 1310)     LOS: 3 days     Wandy Bossler, MD, FACP, FHM. [facility] Pager [phone]  If 7PM-7AM, please contact night-coverage [email or web address] Password [email or web address] 04/27/2017, 1:18 PM

## 2017-04-27 NOTE — Progress Notes (Addendum)
Modified Barium Swallow Progress Note  Patient Details  Name: Rebecca [name] MRN: [id number] Date of Birth: January 31, 1941  Today's Date: 04/27/2017  Modified Barium Swallow completed.  Full report located under Chart Review in the Imaging Section.  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 Oral deficits are mild-mod with decreased oral control and transit. Silent and sensed aspiration and penetration before and during the swallow with thin, nectar and honey (cup & teaspoon volumes) due to decreased timing of initiation of protective mechanisms in addition to wide pharyngeal space, weak elevation of larynx and poor epiglottic inversion. Chin tuck helped to narrow pharyngeal space promoting increased laryngeal closure however airway intrusion continued. Moderate vallecular residue of puree texture not fully cleared. Esophagus scanned revealing residue in distal to mid esophagus. Puree consumed without entering airway however due to deconditioned state would not recommend po continue po's at present and recommend alternate means of nutrition with continued ST. Timeline/prognosis for improvement unclear however hopeful for return to food/liquid when safe. Consider GI consult.    Swallow Evaluation Recommendations       SLP Diet Recommendations: NPO       Medication Administration: Via alternative means               Oral Care Recommendations: Oral care QID        [name] 04/27/2017,1:50 PM   [name] [phone]

## 2017-04-27 NOTE — Plan of Care (Signed)
Pt. Now able to get out of bed and into chair. Tolerated mobility well.

## 2017-04-27 NOTE — Consult Note (Signed)
Referring Provider:  Dr. Algis Liming Primary Care Physician:  [name], MD Primary Gastroenterologist:  Dr. Cristina Gong   Reason for Consultation:  Aspiration pneumonia, abnormal modified barium swallow  HPI: Rebecca [name] is a 76 y.o. female with multiple medical problems including COPD, PMR currently on chronic prednisone therapy, remote history of coronary artery disease, chronic kidney disease, diastolic heart failure, MGUS admitted to the hospital with lethargy and fever. Patient was subsequently diagnosed with aspiration pneumonia. She underwent speech pathology evaluation and modified barium swallow which were multifactorial oropharyngeal dysphagia. Images from the esophagus revealed residue in the distal to midesophagus. GI is consulted for further evaluation.  Patient seen and examined at bedside. Currently resting in the bed. Son at bedside. According to son, patient having intermittent dysphagia for last several months. She is currently on pured diet. She is not taking any solid foods. Also discussed with daughter over the phone.  Past Medical History:  Diagnosis Date  . AKI (acute kidney injury) ([place])   . Anemia in chronic renal disease   . Aortic atherosclerosis ([place]) 01/27/2017  . CAD (coronary artery disease) 2006   a. s/p DES to RCA in 2006 b. low-risk NST in 2014 c. cath in 08/2015 showing patent RCA stent with nonobstructive disease  . CKD (chronic kidney disease) stage 4, GFR 15-29 ml/min (HCC)   . Complication of anesthesia   . COPD (chronic obstructive pulmonary disease) ([place])   . Diabetes mellitus   . Diastolic heart failure, NYHA class 1 ([place])   . Dyslipidemia   . Dysuria 01/06/2015  . Frozen shoulder    right  . Gastroesophageal reflux   . HAP (hospital-acquired pneumonia)   . Headache   . Hyperkalemia 01/01/2016  . Hypothyroidism   . 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 Obesity   . Pancreatic mass 01/27/2017  . Pneumonia 06/2016  .  Polymyalgia rheumatica ([place])   . PONV (postoperative nausea and vomiting)   . Renal insufficiency   . Respiratory distress   . Schizoaffective disorder ([place])   . Sepsis due to pneumonia ([place])   . Shortness of breath   . Stroke [facility])     Past Surgical History:  Procedure Laterality Date  . ABDOMINAL HYSTERECTOMY    . CATARACT EXTRACTION    . CHOLECYSTECTOMY    . CORONARY ANGIOPLASTY WITH STENT PLACEMENT  2006   Taxus DES to RCA, 50 % residual  . PARTIAL HYSTERECTOMY  1995    Prior to Admission medications   Medication Sig Start Date End Date Taking? Authorizing Provider  acetaminophen (TYLENOL) 500 MG tablet Take 1 tablet (500 mg total) by mouth every 6 (six) hours as needed (pain). Patient taking differently: Take 1,000 mg 2 (two) times daily as needed by mouth (pain).  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    Yes [provider]  cetirizine (ZYRTEC) 10 MG tablet Take 10 mg by mouth at bedtime.    Yes [provider]  clopidogrel (PLAVIX) 75 MG tablet Take 1 tablet (75 mg total) by mouth daily with breakfast. Patient taking differently: Take 75 mg by mouth at bedtime.  10/18/13  Yes Thurnell Lose, MD  cloZAPine (CLOZARIL) 100 MG tablet Take 300 mg by mouth at bedtime. 11/04/16  Yes [provider]  colchicine 0.6 MG tablet Take 0.6 mg See  admin instructions by mouth. Take 1 tablet (0.6 mg) by mouth at bedtime with food 11/9 thru 11/13 - then give results to Dr. Leigh Aurora when called   Yes [provider]  Cranberry (SM CRANBERRY) 300 MG tablet Take 300 mg daily with breakfast by mouth.    Yes [provider]  diltiazem (CARDIZEM CD) 240 MG 24 hr capsule Take 240 mg daily with breakfast by mouth.    Yes [provider]  fluticasone (FLONASE) 50 MCG/ACT nasal spray Place 2 sprays daily as  needed into both nostrils (seasonal allergies).  03/04/17  Yes [provider]  Fluticasone-Salmeterol (ADVAIR) 250-50 MCG/DOSE AEPB Inhale 1 puff into the lungs at bedtime.   Yes [provider]  furosemide (LASIX) 20 MG tablet Take 20 mg daily as needed by mouth (weight gain of 3 lbs in 24 hours).   Yes [provider]  guaifenesin (HUMIBID E) 400 MG TABS tablet Take 400 mg 3 (three) times daily by mouth.   Yes [provider]  ipratropium-albuterol (DUONEB) 0.5-2.5 (3) MG/3ML SOLN Inhale 3 mLs into the lungs 3 (three) times daily.  11/05/16  Yes [provider]  isosorbide mononitrate (IMDUR) 60 MG 24 hr tablet Take 1 tablet (60 mg total) by mouth daily. Patient taking differently: Take 60 mg daily with breakfast by mouth.  03/14/13  Yes Isaiah Serge, NP  levothyroxine (SYNTHROID, LEVOTHROID) 75 MCG tablet Take 75 mcg by mouth daily before breakfast.   Yes [provider]  Liniments ([place]) Place 1 patch See admin instructions onto the skin. Apply one patch to lower back and one patch to hip every morning as needed for pain, remove after 8 hours   Yes [provider]  metoprolol succinate (TOPROL-XL) 100 MG 24 hr tablet Take 100 mg by mouth daily with breakfast. Take with or immediately following a meal.   Yes [provider]  Multiple Vitamin (MULTIVITAMIN WITH MINERALS) TABS tablet Take 1 tablet daily with supper by mouth.   Yes [provider]  nitroGLYCERIN (NITROSTAT) 0.4 MG SL tablet Place 1 tablet (0.4 mg total) under the tongue every 5 (five) minutes as needed. For chest pain Patient taking differently: Place 0.4 mg every 5 (five) minutes as needed under the tongue for chest pain.  01/11/17  Yes Strader, [place] M, PA-C  ondansetron (ZOFRAN) 4 MG tablet Take 1 tablet (4 mg total) by mouth every 8 (eight) hours as needed for nausea or vomiting. 01/16/17  Yes Tegeler, Gwenyth Allegra, MD   pantoprazole (PROTONIX) 40 MG tablet Take 1 tablet (40 mg total) by mouth daily. Patient taking differently: Take 40 mg daily with breakfast by mouth.  04/03/13  Yes Hilty, Nadean Corwin, MD  polyethylene glycol powder (GLYCOLAX/MIRALAX) powder Take 17 g by mouth 2 (two) times daily. [place] Patient taking differently: Take 17 g 2 (two) times daily by mouth. Mix in 8 oz liquid and drink 01/28/17  Yes Samuella Cota, MD  pravastatin (PRAVACHOL) 20 MG tablet Take 20 mg by mouth at bedtime.   Yes [provider]  predniSONE (DELTASONE) 10 MG tablet Take 1 tablet (10 mg total) by mouth daily with breakfast. 10/11/16  Yes Charlynne Cousins, MD  Probiotic Product (PROBIOTIC PO) Take 1 capsule daily with breakfast by mouth.    Yes [provider]  senna-docusate (SENNA-S) 8.6-50 MG tablet Take 2 tablets at bedtime by mouth.   Yes [provider]  tamsulosin [facility])  0.4 MG CAPS capsule Take 1 capsule (0.4 mg total) by mouth daily. Patient taking differently: Take 0.4 mg daily with breakfast by mouth.  03/27/17  Yes Thurnell Lose, MD  Tiotropium Bromide Monohydrate (SPIRIVA RESPIMAT) 2.5 MCG/ACT AERS Inhale 2 puffs daily into the lungs.    Yes [provider]  traMADol (ULTRAM) 50 MG tablet Take 50 mg See admin instructions by mouth. Take 1 tablet (50 mg) by mouth daily with breakfast, may also take 1 tablet (50 mg) two more times daily as needed for pain   Yes [provider]  TraMADol HCl 300 MG CP24 Take 300 mg daily as needed by mouth (pain from physical therapy).  02/22/17  Yes [provider]  Vitamin D, Ergocalciferol, (DRISDOL) 50000 UNITS CAPS capsule Take 1 capsule (50,000 Units total) by mouth every 7 (seven) days. Patient taking differently: Take 50,000 Units every Monday by mouth.  08/27/13  Yes Kinnie Feil, MD    Scheduled Meds: . aspirin  300 mg Rectal Daily  . cloZAPine  300 mg Oral QHS  . colchicine  0.3 mg Oral QHS   . dexamethasone  10 mg Intravenous Q24H  . guaiFENesin  400 mg Oral TID  . heparin  5,000 Units Subcutaneous Q8H  . insulin aspart  0-9 Units Subcutaneous Q4H  . ipratropium-albuterol  3 mL Inhalation TID  . isosorbide mononitrate  60 mg Oral Daily  . levothyroxine  37.5 mcg Intravenous Daily  . metoprolol tartrate  5 mg Intravenous Q8H  . mometasone-formoterol  2 puff Inhalation BID  . pantoprazole (PROTONIX) IV  40 mg Intravenous Q24H  . polyethylene glycol  17 g Oral BID  . senna-docusate  2 tablet Oral QHS  . tamsulosin  0.4 mg Oral Q breakfast   Continuous Infusions: . diltiazem (CARDIZEM) infusion 5 mg/hr (04/27/17 1400)  . meropenem (MERREM) IV 1 g (04/27/17 1502)  .  sodium bicarbonate  infusion 1000 mL 75 mL/hr at 04/27/17 1454  . vancomycin Stopped (04/26/17 1310)   PRN Meds:.acetaminophen **OR** acetaminophen, fluticasone, ondansetron **OR** ondansetron (ZOFRAN) IV, traMADol  Allergies as of 04/24/2017 - Review Complete 04/24/2017  Allergen Reaction Noted  . Levaquin [levofloxacin] Other (See Comments) 08/10/2015  . Tape Other (See Comments) 07/14/2016    Family History  Problem Relation Age of Onset  . Diabetes Mother   . Heart disease Mother   . Heart disease Father   . Arthritis Father   . Healthy Sister   . Heart disease Brother   . Cancer Brother        colon ca  . Osteoporosis Sister   . Heart disease Brother   . Diabetes Brother   . Heart disease Brother   . Diabetes Brother   . Diabetes Brother   . Healthy Brother   . Healthy Brother   . Healthy Brother   . Cancer Maternal Uncle        blood condition  . Cancer Daughter        breast ca  . Cancer Cousin        NHL    Social History   Socioeconomic History  . Marital status: Divorced    Spouse name: Not on file  . Number of children: 3  . Years of education: 9th  . Highest education level: Not on file  Social Needs  . Financial resource strain: Not on file  . Food insecurity -  worry: Not on file  . Food insecurity - inability: Not on  file  . Transportation needs - medical: Not on file  . Transportation needs - non-medical: Not on file  Occupational History  . Occupation: homemaker  Tobacco Use  . Smoking status: Former Smoker    Packs/day: 1.00    Years: 35.00    Pack years: 35.00    Last attempt to quit: 04/14/2004    Years since quitting: 13.0  . Smokeless tobacco: Never Used  Substance and Sexual Activity  . Alcohol use: No  . Drug use: No  . Sexual activity: No  Other Topics Concern  . Not on file  Social History Narrative   Daughter [name] and son are very involved.     [name] comes to most office visits.   She now lives at home with daughter [name]   Daughter is HCPOA, not averse to SNF/rehab in short-term, but does not wish pt to return to [facility] and requests that pt/pt's care not be discussed by [facility] providers to [facility], moving forward.   Caffeine Use: 1-3 cups daily    Review of Systems: Unable to obtain  Physical Exam: Vital signs: Vitals:   04/27/17 1239 04/27/17 1334  BP: (!) 169/68   Pulse:    Resp:    Temp: 98.3 F (36.8 C)   SpO2:  98%   Last BM Date: 04/23/17 General:   Elderly patient resting comfortably in the bed. Lungs:  Coarse breath sounds bilaterally Heart:  Regular rate and rhythm; no murmurs, clicks, rubs,  or gallops. Abdomen: Soft, mild fullness in right upper quadrant, nontender, bowel sounds present. No peritoneal signs  Psych : Not able to evaluate LE : Petechiae and bilateral lower extremity. No significant edema Rectal:  Deferred  GI:  Lab Results: Recent Labs    04/25/17 0306 04/26/17 0216 04/27/17 0703  WBC 23.9* 19.3* 25.7*  HGB 10.7* 7.5* 9.4*  HCT 31.1* 21.0* 27.2*  PLT 187 169 179   BMET Recent Labs    04/25/17 0306 04/26/17 0216 04/27/17 0703  NA 127* 131* 136  K 4.6 3.9 4.0  CL 93* 105 108  CO2 22 17* 16*  GLUCOSE 174* 161* 179*  BUN 84* 89* 79*  CREATININE  2.71* 2.99* 2.60*  CALCIUM 9.1 7.6* 8.4*   LFT No results for input(s): PROT, ALBUMIN, AST, ALT, ALKPHOS, BILITOT, BILIDIR, IBILI in the last 72 hours. PT/INR No results for input(s): LABPROT, INR in the last 72 hours.   Studies/Results: Dg Abd Portable 1v  Result Date: 04/27/2017 CLINICAL DATA:  Vomiting. EXAM: PORTABLE ABDOMEN - 1 VIEW COMPARISON:  Scout image for CT scan of the abdomen dated 01/16/2017 FINDINGS: There are no dilated loops of large or small bowel. The stomach is not distended. There is extensive stool in the right side of the colon with less prominent stool in the proximal descending colon. No evidence of fecal impaction. Chronic degenerative changes in the lumbar spine. IMPRESSION: No acute abnormality. Moderate stool in the colon, but less prominent than on the prior CT scan. Electronically Signed   By: Lorriane Shire M.D.   On: 04/27/2017 09:59   Dg Swallowing Func-speech Pathology  Result Date: 04/27/2017 Objective Swallowing Evaluation: Type of Study: MBS-Modified Barium Swallow Study  Patient Details Name: ANNARAE MACNAIR MRN: [id number] Date of Birth: 1940-12-11 Today's Date: 04/27/2017 Time: SLP Start Time (ACUTE ONLY): 1205 -SLP Stop Time (ACUTE ONLY): 1226 SLP Time Calculation (min) (ACUTE ONLY): 21 min Past Medical History: Past Medical History: Diagnosis Date . AKI (acute kidney injury) ([place])  .  Anemia in chronic renal disease  . Aortic atherosclerosis ([place]) 01/27/2017 . CAD (coronary artery disease) 2006  a. s/p DES to RCA in 2006 b. low-risk NST in 2014 c. cath in 08/2015 showing patent RCA stent with nonobstructive disease . CKD (chronic kidney disease) stage 4, GFR 15-29 ml/min (HCC)  . Complication of anesthesia  . COPD (chronic obstructive pulmonary disease) ([place])  . Diabetes mellitus  . Diastolic heart failure, NYHA class 1 ([place])  . Dyslipidemia  . Dysuria 01/06/2015 . Frozen shoulder   right . Gastroesophageal reflux  . HAP (hospital-acquired pneumonia)  . Headache   . Hyperkalemia 01/01/2016 . Hypothyroidism  . MGUS (monoclonal gammopathy of unknown significance) 01/28/2014 . Obesity  . Pancreatic mass 01/27/2017 . Pneumonia 06/2016 . Polymyalgia rheumatica ([place])  . PONV (postoperative nausea and vomiting)  . Renal insufficiency  . Respiratory distress  . Schizoaffective disorder ([place])  . Sepsis due to pneumonia ([place])  . Shortness of breath  . Stroke [facility])  Past Surgical History: Past Surgical History: Procedure Laterality Date . ABDOMINAL HYSTERECTOMY   . CATARACT EXTRACTION   . CHOLECYSTECTOMY   . CORONARY ANGIOPLASTY WITH STENT PLACEMENT  2006  Taxus DES to RCA, 50 % residual . PARTIAL HYSTERECTOMY  1995 HPI: Velena A Coxis a 76 y.o.femalewith medical history significant ofCOPD, pna,PMR, CAD, CVA, pna, GERD, pancreatic mass, barium esophagram revealing mild esophageal dysmotility, puree diet at baseline (thin liquids), schizoaffective disorder, CKD stage IV, diastolic heart failure, MGUS, hypothyroidism who was most recently admitted at [facility] long hospital secondary to right-sided trochanteric bursitis, acute on chronic diastolic heart failure and healthcare acquired pneumonia. This admission found to have healthcare associated pna). MBS 09/2016 reveaked suspected primary esophageal deficits, pharyngeal residue, trace penetration thin to cords silent while consuming barium pill. Dys 3, thin recommended. New s/s aspiraiton exhibited 11/14 with recommendation for full objective assessment.   Subjective: pt awake in bed Assessment / Plan / Recommendation CHL IP CLINICAL IMPRESSIONS 04/27/2017 Clinical Impression Pt exhibits a suspected acute on chronic multifactoral oropharyngeal dysphagia evidenced by impaired motor and sensory function, history of known esophageal dysphagia and possible cervical fusion/osteophytes further impacted by current pna and deconditioning. Oral deficits are mild-mod with decreased oral control and transit. Silent and sensed aspiration and  penetration before and during the swallow with thin, nectar and honey (cup & teaspoon volumes) due to decreased timing of initiation of protective mechanisms in addition to wide pharyngeal space, weak elevation of larynx and poor epiglottic inversion. Chin tuck helped to narrow pharyngeal space promoting increased laryngeal closure however airway intrusion continued. Moderate vallecular residue of puree texture not fully cleared. Esophagus scanned revealing residue in distal to mid esophagus. Puree consumed without entering airway however due to deconditioned state would not recommend po continue po's at present and recommend alternate means of nutrition with continued ST. Timeline/prognosis for improvement unclear however hopeful for return to food/liquid when safe.  SLP Visit Diagnosis Dysphagia, oropharyngeal phase (R13.12) Attention and concentration deficit following -- Frontal lobe and executive function deficit following -- Impact on safety and function (No Data)   CHL IP TREATMENT RECOMMENDATION 04/27/2017 Treatment Recommendations Therapy as outlined in treatment plan below   Prognosis 04/27/2017 Prognosis for Safe Diet Advancement (No Data) Barriers to Reach Goals Severity of deficits Barriers/Prognosis Comment -- CHL IP DIET RECOMMENDATION 04/27/2017 SLP Diet Recommendations NPO Liquid Administration via -- Medication Administration Via alternative means Compensations -- Postural Changes --   CHL IP OTHER RECOMMENDATIONS 04/27/2017 Recommended Consults -- Oral Care Recommendations Oral  care QID Other Recommendations --   CHL IP FOLLOW UP RECOMMENDATIONS 04/27/2017 Follow up Recommendations 24 hour supervision/assistance   CHL IP FREQUENCY AND DURATION 04/27/2017 Speech Therapy Frequency (ACUTE ONLY) min 2x/week Treatment Duration 2 weeks      CHL IP ORAL PHASE 04/27/2017 Oral Phase Impaired Oral - Pudding Teaspoon -- Oral - Pudding Cup -- Oral - Honey Teaspoon Delayed oral transit Oral - Honey Cup Delayed  oral transit;Decreased bolus cohesion Oral - Nectar Teaspoon -- Oral - Nectar Cup Delayed oral transit;Decreased bolus cohesion Oral - Nectar Straw -- Oral - Thin Teaspoon -- Oral - Thin Cup Delayed oral transit;Decreased bolus cohesion Oral - Thin Straw -- Oral - Puree Delayed oral transit Oral - Mech Soft -- Oral - Regular -- Oral - Multi-Consistency -- Oral - Pill -- Oral Phase - Comment --  CHL IP PHARYNGEAL PHASE 04/27/2017 Pharyngeal Phase Impaired Pharyngeal- Pudding Teaspoon -- Pharyngeal -- Pharyngeal- Pudding Cup -- Pharyngeal -- Pharyngeal- Honey Teaspoon Penetration/Aspiration during swallow;Reduced airway/laryngeal closure;Reduced laryngeal elevation;Reduced anterior laryngeal mobility;Reduced epiglottic inversion;Reduced pharyngeal peristalsis Pharyngeal Material enters airway, remains ABOVE vocal cords and not ejected out Pharyngeal- Honey Cup Penetration/Aspiration during swallow;Reduced airway/laryngeal closure;Reduced laryngeal elevation;Reduced anterior laryngeal mobility;Reduced epiglottic inversion Pharyngeal Material enters airway, remains ABOVE vocal cords then ejected out;Material enters airway, remains ABOVE vocal cords and not ejected out Pharyngeal- Nectar Teaspoon -- Pharyngeal -- Pharyngeal- Nectar Cup Penetration/Aspiration during swallow;Reduced laryngeal elevation;Reduced airway/laryngeal closure;Reduced anterior laryngeal mobility;Reduced epiglottic inversion;Reduced pharyngeal peristalsis;Reduced tongue base retraction Pharyngeal Material enters airway, passes BELOW cords without attempt by patient to eject out (silent aspiration) Pharyngeal- Nectar Straw -- Pharyngeal -- Pharyngeal- Thin Teaspoon -- Pharyngeal -- Pharyngeal- Thin Cup Penetration/Aspiration before swallow;Reduced pharyngeal peristalsis;Reduced epiglottic inversion;Reduced laryngeal elevation;Reduced anterior laryngeal mobility;Reduced airway/laryngeal closure;Reduced tongue base retraction Pharyngeal Material enters  airway, passes BELOW cords and not ejected out despite cough attempt by patient Pharyngeal- Thin Straw -- Pharyngeal -- Pharyngeal- Puree Pharyngeal residue - valleculae;Reduced epiglottic inversion Pharyngeal -- Pharyngeal- Mechanical Soft -- Pharyngeal -- Pharyngeal- Regular -- Pharyngeal -- Pharyngeal- Multi-consistency -- Pharyngeal -- Pharyngeal- Pill -- Pharyngeal -- Pharyngeal Comment --  CHL IP CERVICAL ESOPHAGEAL PHASE 04/27/2017 Cervical Esophageal Phase WFL Pudding Teaspoon -- Pudding Cup -- Honey Teaspoon -- Honey Cup -- Nectar Teaspoon -- Nectar Cup -- Nectar Straw -- Thin Teaspoon -- Thin Cup -- Thin Straw -- Puree -- Mechanical Soft -- Regular -- Multi-consistency -- Pill -- Cervical Esophageal Comment -- CHL IP GO 10/17/2013 Functional Assessment Tool Used Mini-mental State Examination, ASHA NOMS, clinical judgment Functional Limitations Memory Swallow Current Status [phone]) (None) Swallow Goal Status (H3716) (None) Swallow Discharge Status (R6789) (None) Motor Speech Current Status (F8101) (None) Motor Speech Goal Status (B5102) (None) Motor Speech Goal Status (H8527) (None) Spoken Language Comprehension Current Status (P8242) (None) Spoken Language Comprehension Goal Status (P5361) (None) Spoken Language Comprehension Discharge Status [phone]) (None) Spoken Language Expression Current Status [phone]) (None) Spoken Language Expression Goal Status (P6195) (None) Spoken Language Expression Discharge Status [phone]) (None) Attention Current Status (Z1245) (None) Attention Goal Status (Y0998) (None) Attention Discharge Status (P3825) (None) Memory Current Status (K5397) CI Memory Goal Status (Q7341) CI Memory Discharge Status (G9170) CI Voice Current Status (P3790) (None) Voice Goal Status (W4097) (None) Voice Discharge Status (D5329) (None) Other Speech-Language Pathology Functional Limitation Current Status (J2426) (None) Other Speech-Language Pathology Functional Limitation Goal Status (S3419) (None)  Other Speech-Language Pathology Functional Limitation Discharge Status [phone]) (None) [name] 04/27/2017, 1:49 PM Orbie Pyo Colvin Caroli.[employer] [phone]  Impression/Plan: - Oropharyngeal dysphagia - Abnormal modified barium swallow concerning for residue in the mid to distal esophagus. - Aspiration pneumonia - Coronary artery disease with history of TIA. Plavix on hold because of nothing by mouth status. 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 Patient was scheduled for EUS by Dr. Ardis Hughs but subsequently she was admitted with weakness multiple medical issues . According to family, they decided not to pursue further investigation of her pancreatic cyst.   Recommendations -------------------------- - Patient with multiple medical comorbidities at this time. Patient is resting comfortably in the bed and I do not suspect any food impaction at this time. Barium swallow in August on the showed esophageal dysmotility. There was no stricture or mass noted and barium pill passed into the stomach without any difficulty. - Detailed discussion with patient's son as well as daughter ( Over the phone). Risk and benefits of EGD discussed with both of them. Family prefers conservative management at this time. - I will repeat barium swallow for further evaluation after D/W speech pathologist  - GI will follow   LOS: 3 days   Otis Brace  MD, FACP 04/27/2017, 3:20 PM  Contact #  [phone]

## 2017-04-28 ENCOUNTER — Inpatient Hospital Stay (HOSPITAL_COMMUNITY): Payer: Medicare Other

## 2017-04-28 ENCOUNTER — Other Ambulatory Visit: Payer: Self-pay

## 2017-04-28 DIAGNOSIS — E872 Acidosis: Secondary | ICD-10-CM

## 2017-04-28 LAB — GLUCOSE, CAPILLARY
GLUCOSE-CAPILLARY: 138 mg/dL — AB (ref 65–99)
GLUCOSE-CAPILLARY: 167 mg/dL — AB (ref 65–99)
GLUCOSE-CAPILLARY: 162 mg/dL — AB (ref 65–99)
GLUCOSE-CAPILLARY: 144 mg/dL — AB (ref 65–99)
Glucose-Capillary: 164 mg/dL — ABNORMAL HIGH (ref 65–99)
Glucose-Capillary: 162 mg/dL — ABNORMAL HIGH (ref 65–99)

## 2017-04-28 LAB — CBC
HCT: 24.3 % — ABNORMAL LOW (ref 36.0–46.0)
Hemoglobin: 8.4 g/dL — ABNORMAL LOW (ref 12.0–15.0)
MCH: 30.5 pg (ref 26.0–34.0)
MCHC: 34.6 g/dL (ref 30.0–36.0)
MCV: 88.4 fL (ref 78.0–100.0)
PLATELETS: 192 10*3/uL (ref 150–400)
RBC: 2.75 MIL/uL — AB (ref 3.87–5.11)
RDW: 14.2 % (ref 11.5–15.5)
WBC: 18.3 10*3/uL — ABNORMAL HIGH (ref 4.0–10.5)

## 2017-04-28 LAB — BASIC METABOLIC PANEL
Anion gap: 8 (ref 5–15)
BUN: 68 mg/dL — AB (ref 6–20)
CALCIUM: 8.3 mg/dL — AB (ref 8.9–10.3)
CO2: 21 mmol/L — ABNORMAL LOW (ref 22–32)
Chloride: 111 mmol/L (ref 101–111)
Creatinine, Ser: 2.17 mg/dL — ABNORMAL HIGH (ref 0.44–1.00)
GFR calc Af Amer: 24 mL/min — ABNORMAL LOW (ref >60)
GFR, EST NON AFRICAN AMERICAN: 21 mL/min — AB (ref >60)
GLUCOSE: 167 mg/dL — AB (ref 65–99)
POTASSIUM: 3.9 mmol/L (ref 3.5–5.1)
SODIUM: 140 mmol/L (ref 135–145)

## 2017-04-28 LAB — PROCALCITONIN: Procalcitonin: 2.27 ng/mL

## 2017-04-28 MED ORDER — IOPAMIDOL (ISOVUE-300) INJECTION 61%
INTRAVENOUS | Status: AC
  Filled 2017-04-28: qty 150

## 2017-04-28 MED ORDER — SODIUM CHLORIDE 0.9 % IV SOLN
INTRAVENOUS | Status: DC
  Administered 2017-04-28 – 2017-04-29 (×2): via INTRAVENOUS

## 2017-04-28 MED ORDER — BISACODYL 10 MG RE SUPP
10.0000 mg | Freq: Once | RECTAL | Status: AC
  Administered 2017-04-28: 10 mg via RECTAL
  Filled 2017-04-28: qty 1

## 2017-04-28 MED ORDER — LOPERAMIDE HCL 2 MG PO CAPS: 2.0000 mg | ORAL_CAPSULE | Freq: Two times a day (BID) | ORAL | Status: DC | PRN

## 2017-04-28 NOTE — Progress Notes (Signed)
PROGRESS NOTE   Rebecca [name]  ZWC:[id number]    DOB: 11/01/1940    DOA: 04/24/2017  PCP: [name], MD   I have briefly reviewed patients previous medical records in [facility]  Brief Narrative:  76 year old female with PMH of COPD, not on home oxygen, PMR on chronic prednisone 10 MG daily, CAD status post DES to RCA 2006, stage III CKD, chronic diastolic CHF, MGUS, hypothyroid, recent hospitalization for right trochanteric bursitis, acute on chronic diastolic CHF and HCAP, discharged to SNF and subsequently home, treated for presumed UTI with Ceftin at discharge from SNF, presented with lethargy, fever, cough productive of yellow sputum and vomiting. Admitted for LLL pneumonia suspicious for aspiration pneumonia. Failed swallow evaluation on 11/14. Concern for esophageal dysmotility, less likely obstruction. [facility] consulted. NPO for now.   Assessment & Plan:   Principal Problem:   Aspiration pneumonia ([place]) Active Problems:   Hypothyroidism   Schizoaffective disorder ([place])   GASTROESOPHAGEAL REFLUX, NO ESOPHAGITIS   Polymyalgia rheumatica (HCC)   Essential hypertension   Lobar pneumonia, unspecified organism (HCC)   TIA (transient ischemic attack)   Recurrent UTI   Dysphagia   Sepsis secondary to lobar/aspiration pneumonia - Recently hospitalized 10/1-10/13 for HCAP, treated with vancomycin/Zosyn and discharged on oral Augmentin. - Given history of GERD, dysphagia, on modified diet at home, input by speech therapy (failed swallow evaluation), strongly suspect aspiration pneumonia. - Continue empirically started IV vancomycin and meropenem. - Pulmonary toilet. - Recommend follow-up chest x-ray in 4 weeks to ensure resolution of pneumonia findings. - Blood cultures 2: Negative to date. Sputum culture shows Candida. RSV PCR negative. - Clinically improving. Cultures negative thus far. MRSA PCR negative. DC vancomycin.  Acute respiratory failure with hypoxia -  Secondary to pneumonia and underlying COPD. Resolved. Continue to monitor closely.  Dysphagia/vomiting - Speech therapy evaluated on 11/14 by MBS and indicated that she was a high risk for aspiration and noted food products retained in lower esophagus and recommended nothing by mouth. - Consulted [facility]. Family did not want to pursue any aggressive interventions i.e. EGD. As per GI, recent barium swallow did not show obstruction and hence low likelihood of that. In any event formal barium swallow was done on 11/15 which showed no obstruction. - Discussed with speech therapy on 11/15 and awaiting dietary recommendations.  Anemia of chronic disease/chronic kidney disease Fluctuating but overall stable. Follow CBC in a.m.  Leukocytosis - Secondary to IV steroids and sepsis. Management as above. Slightly better today.  Acute on Stage III chronic kidney disease - Patient had creatinine of 1.19 on 10/13. Presented with creatinine of 2.2 which peaked to 2.9. Likely related to volume depletion. Continue gentle IV fluids and follow BMP in a.m. Creatinine improved to 2.17.  Non-anion gap metabolic acidosis - No diarrhea reported. Resolved after brief IV bicarbonate drip-DC'd bicarbonate.  CAD - it appears that patient was on Plavix for TIA. On only aspirin when necessary for pain issues. Unable to take oral Plavix due to dysphagia and nothing by mouth. Temporarily rectal aspirin.    TIA - Antiplatelet discussion as above.   COPD - Stable without clinical bronchospasm.   PMR/chronic prednisone - Currently on IV Decadron 10 MG daily. Follows with Dr. Amil Amen, Rheumatology. Recently started on colchicine.  MGUS - Follows with outpatient hematology/oncology.   Essential hypertension - Mildly uncontrolled. Changed oral medications to IV. IV Cardizem infusion at 5 mg and IV metoprolol 5 MG every 8 hours. Change back to oral  when able.  Mildly uncontrolled but acceptable.  Chronic diastolic  CHF - Appears mildly volume depleted.   Hypothyroid - IV levothyroxine.   Hyponatremia - Resolved.   GERD - Change PPI to IV.   Schizoaffective disorder - Patient follows with Dr. Casimiro Needle, Psychiatry. On long-term Clozaril. Some issues with a.m. sedation. Monitor closely and may have to reduce dose or hold temporarily if sedation becomes an issue. Alert this morning. Did not get clozapine on 11/14 due to nothing by mouth. Hopefully should be able to start today if diet resumed.  Interstitial cystitis - Previously cultures positive for Pseudomonas.  - ID evaluated patient on previous admission and determined her to have colonization by Pseudomonas. No antibiotic treatment unless clearly symptomatic. She was on Ceftin as outpatient due to dysuria. Currently on meropenem.  - Urine culture this admission shows Enterobacter aeruginosa sensitive to imipenem.   Chronic hip pain secondary to L5-S1 spondylosis, right trochanteric bursitis  - Seen by Dr. Ninfa Linden, orthopedics on previous admission and underwent intra-articular steroid injection 03/24/17   Adult failure to thrive  - Multifactorial due to advanced age, frail and multiple comorbidities.  - May need to consider goals of care if not progressively improving   Constipation - Bowel regimen. Did not respond with Dulcolax suppository. Does not want enema. Repeat Dulcolax suppository today.  Prediabetes - Hemoglobin A1c in August was 6. Now complicated by steroids. SSI. Mildly uncontrolled.   DVT prophylaxis: Heparin Code Status: Full Family Communication: Discussed in detail with patient's daughter at bedside. Updated care and answered questions.  Disposition: To be determined. May need SNF at discharge.   Consultants:  [facility]  Procedures:  None   Antimicrobials:  IV cefepime 1 dose  IV Meropenum IV Vancomycin   Subjective: Patient alert and oriented 2 this morning. Wants coffee to drink and something to eat.  Denies dyspnea. Mild dry cough but better than yesterday. No vomiting over the last 24 hours. Chronic hip pain reported.   ROS: As per daughter, issues with reflux over the weekend prior to admission followed by nausea and vomiting.  Objective:  Vitals:   04/27/17 2352 04/28/17 0317 04/28/17 0802 04/28/17 0821  BP: (!) 169/77 (!) 148/64 (!) 164/67 (!) 164/67  Pulse:  97 96 96  Resp:  (!) 21 18 18   Temp: 97.7 F (36.5 C) 98 F (36.7 C) 99.1 F (37.3 C)   TempSrc: Axillary Axillary Oral   SpO2:  98% 96% 97%  Weight:      Height:        Examination:  General exam: Elderly female, small built and nourished, lying comfortably propped up in bed. Looks much improved compared to yesterday. Respiratory system: Improved breath sounds bilaterally. Slightly harsh posteriorly and occasional basal crackles. No rhonchi or wheezing. No increased work of breathing. Cardiovascular system: S1 & S2 heard, RRR. No JVD, murmurs, rubs, gallops or clicks. No pedal edema. Telemetry: Personally reviewed. Sinus rhythm in the 90s. Gastrointestinal system: Abdomen is nondistended, soft and nontender. No organomegaly or masses felt. Normal bowel sounds heard. Stable without change. Central nervous system: Alert and oriented to person and place. No focal neurological deficits. Extremities: Moving all limbs symmetrically.  Skin: No rashes, lesions or ulcers Psychiatry: Judgement and insight impaired but seem to be improved compared to yesterday. Mood & affect flat.     Data Reviewed: I have personally reviewed following labs and imaging studies  CBC: Recent Labs  Lab 04/24/17 1501 04/25/17 0306 04/26/17 0216 04/27/17 0703 04/28/17  0420  WBC 26.6* 23.9* 19.3* 25.7* 18.3*  NEUTROABS 24.2*  --   --   --   --   HGB 11.1* 10.7* 7.5* 9.4* 8.4*  HCT 32.3* 31.1* 21.0* 27.2* 24.3*  MCV 88.5 88.6 89.7 88.0 88.4  PLT 239 187 169 179 102   Basic Metabolic Panel: Recent Labs  Lab 04/24/17 1501  04/25/17 0306 04/26/17 0216 04/27/17 0703 04/28/17 0420  NA 125* 127* 131* 136 140  K 4.3 4.6 3.9 4.0 3.9  CL 90* 93* 105 108 111  CO2 23 22 17* 16* 21*  GLUCOSE 177* 174* 161* 179* 167*  BUN 71* 84* 89* 79* 68*  CREATININE 2.22* 2.71* 2.99* 2.60* 2.17*  CALCIUM 9.3 9.1 7.6* 8.4* 8.3*   Liver Function Tests: Recent Labs  Lab 04/24/17 1501  AST 18  ALT 17  ALKPHOS 40  BILITOT 0.7  PROT 5.8*  ALBUMIN 3.3*   HbA1C: No results for input(s): HGBA1C in the last 72 hours. CBG: Recent Labs  Lab 04/27/17 1659 04/27/17 2058 04/27/17 2314 04/28/17 0318 04/28/17 0800  GLUCAP 204* 203* 193* 164* 138*    Recent Results (from the past 240 hour(s))  Blood Culture (routine x 2)     Status: None (Preliminary result)   Collection Time: 04/24/17  2:40 PM  Result Value Ref Range Status   Specimen Description BLOOD SITE NOT SPECIFIED  Final   Special Requests   Final    BOTTLES DRAWN AEROBIC AND ANAEROBIC Blood Culture results may not be optimal due to an inadequate volume of blood received in culture bottles   Culture NO GROWTH 3 DAYS  Final   Report Status PENDING  Incomplete  Blood Culture (routine x 2)     Status: None (Preliminary result)   Collection Time: 04/24/17  2:57 PM  Result Value Ref Range Status   Specimen Description BLOOD RIGHT ANTECUBITAL  Final   Special Requests   Final    BOTTLES DRAWN AEROBIC AND ANAEROBIC Blood Culture results may not be optimal due to an excessive volume of blood received in culture bottles   Culture NO GROWTH 3 DAYS  Final   Report Status PENDING  Incomplete  Urine culture     Status: Abnormal   Collection Time: 04/24/17  3:01 PM  Result Value Ref Range Status   Specimen Description URINE, CATHETERIZED  Final   Special Requests NONE  Final   Culture >=100,000 COLONIES/mL ENTEROBACTER AEROGENES (A)  Final   Report Status 04/26/2017 FINAL  Final   Organism ID, Bacteria ENTEROBACTER AEROGENES (A)  Final      Susceptibility    Enterobacter aerogenes - MIC*    CEFAZOLIN >=64 RESISTANT Resistant     CEFTRIAXONE >=64 RESISTANT Resistant     CIPROFLOXACIN <=0.25 SENSITIVE Sensitive     GENTAMICIN <=1 SENSITIVE Sensitive     IMIPENEM 1 SENSITIVE Sensitive     NITROFURANTOIN 32 SENSITIVE Sensitive     TRIMETH/SULFA <=20 SENSITIVE Sensitive     PIP/TAZO >=128 RESISTANT Resistant     * >=100,000 COLONIES/mL ENTEROBACTER AEROGENES  Respiratory Panel by PCR     Status: None   Collection Time: 04/25/17 12:23 AM  Result Value Ref Range Status   Adenovirus NOT DETECTED NOT DETECTED Final   Coronavirus 229E NOT DETECTED NOT DETECTED Final   Coronavirus HKU1 NOT DETECTED NOT DETECTED Final   Coronavirus NL63 NOT DETECTED NOT DETECTED Final   Coronavirus OC43 NOT DETECTED NOT DETECTED Final   Metapneumovirus NOT DETECTED NOT DETECTED  Final   Rhinovirus / Enterovirus NOT DETECTED NOT DETECTED Final   Influenza A NOT DETECTED NOT DETECTED Final   Influenza B NOT DETECTED NOT DETECTED Final   Parainfluenza Virus 1 NOT DETECTED NOT DETECTED Final   Parainfluenza Virus 2 NOT DETECTED NOT DETECTED Final   Parainfluenza Virus 3 NOT DETECTED NOT DETECTED Final   Parainfluenza Virus 4 NOT DETECTED NOT DETECTED Final   Respiratory Syncytial Virus NOT DETECTED NOT DETECTED Final   Bordetella pertussis NOT DETECTED NOT DETECTED Final   Chlamydophila pneumoniae NOT DETECTED NOT DETECTED Final   Mycoplasma pneumoniae NOT DETECTED NOT DETECTED Final  MRSA PCR Screening     Status: None   Collection Time: 04/25/17 12:23 AM  Result Value Ref Range Status   MRSA by PCR NEGATIVE NEGATIVE Final    Comment:        The GeneXpert MRSA Assay (FDA approved for NASAL specimens only), is one component of a comprehensive MRSA colonization surveillance program. It is not intended to diagnose MRSA infection nor to guide or monitor treatment for MRSA infections.   Culture, sputum-assessment     Status: None   Collection Time: 04/25/17   6:50 AM  Result Value Ref Range Status   Specimen Description SPUTUM  Final   Special Requests NONE  Final   Sputum evaluation THIS SPECIMEN IS ACCEPTABLE FOR SPUTUM CULTURE  Final   Report Status 04/25/2017 FINAL  Final  Culture, respiratory (NON-Expectorated)     Status: None   Collection Time: 04/25/17  6:50 AM  Result Value Ref Range Status   Specimen Description SPUTUM  Final   Special Requests NONE Reflexed from [id number]  Final   Gram Stain   Final    ABUNDANT WBC PRESENT, PREDOMINANTLY PMN FEW SQUAMOUS EPITHELIAL CELLS PRESENT FEW GRAM POSITIVE COCCI IN PAIRS IN CHAINS RARE GRAM NEGATIVE RODS RARE BUDDING YEAST SEEN    Culture ABUNDANT CANDIDA ALBICANS  Final   Report Status 04/27/2017 FINAL  Final         Radiology Studies: Dg Esophagus  Result Date: 04/28/2017 CLINICAL DATA:  Difficulty swallowing. EXAM: ESOPHOGRAM/BARIUM SWALLOW TECHNIQUE: Single contrast examination was performed using  water-soluble. FLUOROSCOPY TIME:  Fluoroscopy Time:  1 minutes 12 seconds Radiation Exposure Index (if provided by the fluoroscopic device): 9.7 mGy COMPARISON:  None. FINDINGS: The entire examination was performed in the semi upright position. There was normal pharyngeal anatomy and motility. Contrast flowed freely through the esophagus without evidence of stricture or mass. There was normal esophageal mucosa without evidence of irregularity or ulceration. Esophageal motility was normal. No evidence of reflux. No definite hiatal hernia was demonstrated. IMPRESSION: Normal single contrast barium swallow.  No esophageal stricture. Electronically Signed   By: Kathreen Devoid   On: 04/28/2017 10:18   Dg Abd Portable 1v  Result Date: 04/27/2017 CLINICAL DATA:  Vomiting. EXAM: PORTABLE ABDOMEN - 1 VIEW COMPARISON:  Scout image for CT scan of the abdomen dated 01/16/2017 FINDINGS: There are no dilated loops of large or small bowel. The stomach is not distended. There is extensive stool in the right  side of the colon with less prominent stool in the proximal descending colon. No evidence of fecal impaction. Chronic degenerative changes in the lumbar spine. IMPRESSION: No acute abnormality. Moderate stool in the colon, but less prominent than on the prior CT scan. Electronically Signed   By: Lorriane Shire M.D.   On: 04/27/2017 09:59   Dg Swallowing Func-speech Pathology  Result Date: 04/27/2017 Objective Swallowing Evaluation:  Type of Study: MBS-Modified Barium Swallow Study  Patient Details Name: Rebecca [name] MRN: [id number] Date of Birth: 07-31-1940 Today's Date: 04/27/2017 Time: SLP Start Time (ACUTE ONLY): 1205 -SLP Stop Time (ACUTE ONLY): 1226 SLP Time Calculation (min) (ACUTE ONLY): 21 min Past Medical History: Past Medical History: Diagnosis Date . AKI (acute kidney injury) ([place])  . Anemia in chronic renal disease  . Aortic atherosclerosis ([place]) 01/27/2017 . CAD (coronary artery disease) 2006  a. s/p DES to RCA in 2006 b. low-risk NST in 2014 c. cath in 08/2015 showing patent RCA stent with nonobstructive disease . CKD (chronic kidney disease) stage 4, GFR 15-29 ml/min (HCC)  . Complication of anesthesia  . COPD (chronic obstructive pulmonary disease) ([place])  . Diabetes mellitus  . Diastolic heart failure, NYHA class 1 ([place])  . Dyslipidemia  . Dysuria 01/06/2015 . Frozen shoulder   right . Gastroesophageal reflux  . HAP (hospital-acquired pneumonia)  . Headache  . Hyperkalemia 01/01/2016 . Hypothyroidism  . MGUS (monoclonal gammopathy of unknown significance) 01/28/2014 . Obesity  . Pancreatic mass 01/27/2017 . Pneumonia 06/2016 . Polymyalgia rheumatica ([place])  . PONV (postoperative nausea and vomiting)  . Renal insufficiency  . Respiratory distress  . Schizoaffective disorder ([place])  . Sepsis due to pneumonia ([facility])  . Shortness of breath  . Stroke [facility])  Past Surgical History: Past Surgical History: Procedure Laterality Date . ABDOMINAL HYSTERECTOMY   . CATARACT EXTRACTION   . CHOLECYSTECTOMY   . CORONARY  ANGIOPLASTY WITH STENT PLACEMENT  2006  [place] DES to RCA, 50 % residual . PARTIAL HYSTERECTOMY  1995 HPI: Rebecca [name] a 76 y.o.femalewith medical history significant ofCOPD, pna,PMR, CAD, CVA, pna, GERD, pancreatic mass, barium esophagram revealing mild esophageal dysmotility, puree diet at baseline (thin liquids), schizoaffective disorder, CKD stage IV, diastolic heart failure, MGUS, hypothyroidism who was most recently admitted at [facility] secondary to right-sided trochanteric bursitis, acute on chronic diastolic heart failure and healthcare acquired pneumonia. This admission found to have healthcare associated pna). MBS 09/2016 reveaked suspected primary esophageal deficits, pharyngeal residue, trace penetration thin to cords silent while consuming barium pill. Dys 3, thin recommended. New s/s aspiraiton exhibited 11/14 with recommendation for full objective assessment.   Subjective: pt awake in bed Assessment / Plan / Recommendation CHL IP CLINICAL IMPRESSIONS 04/27/2017 Clinical Impression Pt exhibits a suspected acute on chronic multifactoral oropharyngeal dysphagia evidenced by impaired motor and sensory function, history of known esophageal dysphagia and possible cervical fusion/osteophytes further impacted by current pna and deconditioning. Oral deficits are mild-mod with decreased oral control and transit. Silent and sensed aspiration and penetration before and during the swallow with thin, nectar and honey (cup & teaspoon volumes) due to decreased timing of initiation of protective mechanisms in addition to wide pharyngeal space, weak elevation of larynx and poor epiglottic inversion. Chin tuck helped to narrow pharyngeal space promoting increased laryngeal closure however airway intrusion continued. Moderate vallecular residue of puree texture not fully cleared. Esophagus scanned revealing residue in distal to mid esophagus. Puree consumed without entering airway however due to  deconditioned state would not recommend po continue po's at present and recommend alternate means of nutrition with continued ST. Timeline/prognosis for improvement unclear however hopeful for return to food/liquid when safe.  SLP Visit Diagnosis Dysphagia, oropharyngeal phase (R13.12) Attention and concentration deficit following -- Frontal lobe and executive function deficit following -- Impact on safety and function (No Data)   CHL IP TREATMENT RECOMMENDATION 04/27/2017 Treatment Recommendations Therapy as outlined in treatment  plan below   Prognosis 04/27/2017 Prognosis for Safe Diet Advancement (No Data) Barriers to Reach Goals Severity of deficits Barriers/Prognosis Comment -- CHL IP DIET RECOMMENDATION 04/27/2017 SLP Diet Recommendations NPO Liquid Administration via -- Medication Administration Via alternative means Compensations -- Postural Changes --   CHL IP OTHER RECOMMENDATIONS 04/27/2017 Recommended Consults -- Oral Care Recommendations Oral care QID Other Recommendations --   CHL IP FOLLOW UP RECOMMENDATIONS 04/27/2017 Follow up Recommendations 24 hour supervision/assistance   CHL IP FREQUENCY AND DURATION 04/27/2017 Speech Therapy Frequency (ACUTE ONLY) min 2x/week Treatment Duration 2 weeks      CHL IP ORAL PHASE 04/27/2017 Oral Phase Impaired Oral - Pudding Teaspoon -- Oral - Pudding Cup -- Oral - Honey Teaspoon Delayed oral transit Oral - Honey Cup Delayed oral transit;Decreased bolus cohesion Oral - Nectar Teaspoon -- Oral - Nectar Cup Delayed oral transit;Decreased bolus cohesion Oral - Nectar Straw -- Oral - Thin Teaspoon -- Oral - Thin Cup Delayed oral transit;Decreased bolus cohesion Oral - Thin Straw -- Oral - Puree Delayed oral transit Oral - Mech Soft -- Oral - Regular -- Oral - Multi-Consistency -- Oral - Pill -- Oral Phase - Comment --  CHL IP PHARYNGEAL PHASE 04/27/2017 Pharyngeal Phase Impaired Pharyngeal- Pudding Teaspoon -- Pharyngeal -- Pharyngeal- Pudding Cup -- Pharyngeal --  Pharyngeal- Honey Teaspoon Penetration/Aspiration during swallow;Reduced airway/laryngeal closure;Reduced laryngeal elevation;Reduced anterior laryngeal mobility;Reduced epiglottic inversion;Reduced pharyngeal peristalsis Pharyngeal Material enters airway, remains ABOVE vocal cords and not ejected out Pharyngeal- Honey Cup Penetration/Aspiration during swallow;Reduced airway/laryngeal closure;Reduced laryngeal elevation;Reduced anterior laryngeal mobility;Reduced epiglottic inversion Pharyngeal Material enters airway, remains ABOVE vocal cords then ejected out;Material enters airway, remains ABOVE vocal cords and not ejected out Pharyngeal- Nectar Teaspoon -- Pharyngeal -- Pharyngeal- Nectar Cup Penetration/Aspiration during swallow;Reduced laryngeal elevation;Reduced airway/laryngeal closure;Reduced anterior laryngeal mobility;Reduced epiglottic inversion;Reduced pharyngeal peristalsis;Reduced tongue base retraction Pharyngeal Material enters airway, passes BELOW cords without attempt by patient to eject out (silent aspiration) Pharyngeal- Nectar Straw -- Pharyngeal -- Pharyngeal- Thin Teaspoon -- Pharyngeal -- Pharyngeal- Thin Cup Penetration/Aspiration before swallow;Reduced pharyngeal peristalsis;Reduced epiglottic inversion;Reduced laryngeal elevation;Reduced anterior laryngeal mobility;Reduced airway/laryngeal closure;Reduced tongue base retraction Pharyngeal Material enters airway, passes BELOW cords and not ejected out despite cough attempt by patient Pharyngeal- Thin Straw -- Pharyngeal -- Pharyngeal- Puree Pharyngeal residue - valleculae;Reduced epiglottic inversion Pharyngeal -- Pharyngeal- Mechanical Soft -- Pharyngeal -- Pharyngeal- Regular -- Pharyngeal -- Pharyngeal- Multi-consistency -- Pharyngeal -- Pharyngeal- Pill -- Pharyngeal -- Pharyngeal Comment --  CHL IP CERVICAL ESOPHAGEAL PHASE 04/27/2017 Cervical Esophageal Phase WFL Pudding Teaspoon -- Pudding Cup -- Honey Teaspoon -- Honey Cup -- Nectar  Teaspoon -- Nectar Cup -- Nectar Straw -- Thin Teaspoon -- Thin Cup -- Thin Straw -- Puree -- Mechanical Soft -- Regular -- Multi-consistency -- Pill -- Cervical Esophageal Comment -- CHL IP GO 10/17/2013 Functional Assessment Tool Used Mini-mental State Examination, ASHA NOMS, clinical judgment Functional Limitations Memory Swallow Current Status [phone]) (None) Swallow Goal Status (X2119) (None) Swallow Discharge Status (E1740) (None) Motor Speech Current Status (C1448) (None) Motor Speech Goal Status (J8563) (None) Motor Speech Goal Status (J4970) (None) Spoken Language Comprehension Current Status (Y6378) (None) Spoken Language Comprehension Goal Status (H8850) (None) Spoken Language Comprehension Discharge Status [phone]) (None) Spoken Language Expression Current Status [phone]) (None) Spoken Language Expression Goal Status (V6720) (None) Spoken Language Expression Discharge Status [phone]) (None) Attention Current Status (G2836) (None) Attention Goal Status (O2947) (None) Attention Discharge Status (M5465) (None) Memory Current Status (K3546) CI Memory Goal Status (F6812) CI Memory Discharge Status (G9170) CI  Voice Current Status [phone]) (None) Voice Goal Status [phone]) (None) Voice Discharge Status [phone]) (None) Other Speech-Language Pathology Functional Limitation Current Status (H8527) (None) Other Speech-Language Pathology Functional Limitation Goal Status (P8242) (None) Other Speech-Language Pathology Functional Limitation Discharge Status [phone]) (None) [name] 04/27/2017, 1:49 PM Orbie Pyo Colvin Caroli.Ed CCC-SLP Pager [phone]                   Scheduled Meds: . aspirin  300 mg Rectal Daily  . cloZAPine  300 mg Oral QHS  . colchicine  0.3 mg Oral QHS  . dexamethasone  10 mg Intravenous Q24H  . guaiFENesin  400 mg Oral TID  . heparin  5,000 Units Subcutaneous Q8H  . insulin aspart  0-9 Units Subcutaneous Q4H  . iopamidol      . ipratropium-albuterol  3 mL Inhalation TID  .  isosorbide mononitrate  60 mg Oral Daily  . levothyroxine  37.5 mcg Intravenous Daily  . metoprolol tartrate  5 mg Intravenous Q8H  . mometasone-formoterol  2 puff Inhalation BID  . pantoprazole (PROTONIX) IV  40 mg Intravenous Q24H  . polyethylene glycol  17 g Oral BID  . senna-docusate  2 tablet Oral QHS  . tamsulosin  0.4 mg Oral Q breakfast   Continuous Infusions: . diltiazem (CARDIZEM) infusion 5 mg/hr (04/27/17 1400)  . meropenem (MERREM) IV Stopped (04/27/17 1655)  . vancomycin Stopped (04/26/17 1310)     LOS: 4 days     Vernell Leep, MD, FACP, [facility]. [facility] Pager [phone]  If 7PM-7AM, please contact night-coverage [email or web address] Password [email or web address] 04/28/2017, 12:21 PM

## 2017-04-28 NOTE — Progress Notes (Signed)
Physical Therapy Treatment Patient Details Name: Rebecca STREBEL MRN: [id number] DOB: [birth year] Today's Date: 04/28/2017    History of Present Illness Pt is a 76 y.o. female admitted on 04/24/17 with workup for sepsis secondary to HCAP and acute hypoxemic respiratory failure. Pertinent PMH includes COPD, CAD, HTN, CKD IV, diastolic HF, polymyalgia rheumatica, Chronic hip pain secondary to L5-S1 spondylosis, right trochanteric bursitis (admitted 03/14/17 for hip pain; steroid injection on 03/24/17 with Dr. Ninfa Linden). Of note, recently admitted 10/1-10/13 for HCAP and treated with vanco/zosyn.   PT Comments    Pt making progress with mobility. Today's session focused on therex, with pt able to perform multiple sit-to-stands and bouts of marching in place with RW and min guard for balance. Remains limited by generalized deconditioning and R hip pain. SpO2 remained >90% on RA. Daughter present throughout session; agreeable with rec for pt to d/c to SNF then return home with her afterwards.   Follow Up Recommendations  SNF;Supervision/Assistance - 24 hour     Equipment Recommendations  Other (comment)    Recommendations for Other Services       Precautions / Restrictions Precautions Precautions: Fall Restrictions Weight Bearing Restrictions: No    Mobility  Bed Mobility Overal bed mobility: Needs Assistance Bed Mobility: Rolling;Sidelying to Sit Rolling: Supervision Sidelying to sit: Supervision       General bed mobility comments: Prefers getting out on R-side of bed  Transfers Overall transfer level: Needs assistance Equipment used: Rolling walker (2 wheeled) Transfers: Sit to/from Stand Sit to Stand: Min assist;Min guard         General transfer comment: Initial minA to power into standing with RW; cues for hand placement and technique. Stood 5x more with RW and min guard for balance. C/o fatigue and R hip pain  Ambulation/Gait Ambulation/Gait assistance: Min  assist Ambulation Distance (Feet): 3 Feet Assistive device: Rolling walker (2 wheeled) Gait Pattern/deviations: Step-to pattern;Shuffle;Trunk flexed Gait velocity: Decreased Gait velocity interpretation: <1.8 ft/sec, indicative of risk for recurrent falls General Gait Details: Able to take slow, shuffling steps from bed to chair with RW and minA for balance; cues for upright posture and to cue bilateral hip extension. Declining further amb secondary to R-hip pain, but performed multiple bouts of marching in place for ~10-15 sec (seated rest break in between)   Stairs            Wheelchair Mobility    Modified Rankin (Stroke Patients Only)       Balance Overall balance assessment: Needs assistance Sitting-balance support: No upper extremity supported;Feet supported Sitting balance-Leahy Scale: Fair     Standing balance support: Bilateral upper extremity supported;During functional activity Standing balance-Leahy Scale: Poor Standing balance comment: Reliant on BUE support                            Cognition Arousal/Alertness: Awake/alert Behavior During Therapy: WFL for tasks assessed/performed Overall Cognitive Status: History of cognitive impairments - at baseline Area of Impairment: Attention;Memory;Following commands;Safety/judgement;Problem solving                   Current Attention Level: Selective Memory: Decreased short-term memory Following Commands: Follows multi-step commands with increased time Safety/Judgement: Decreased awareness of safety;Decreased awareness of deficits   Problem Solving: Requires verbal cues General Comments: Pt cognition improved overall this session, more alert and better able to follow commands; continues to require repeated cues for safety and technique. Daughter present and condones baseline cognitive deficits  Exercises      General Comments General comments (skin integrity, edema, etc.): Daughter  present throughout session. SpO2 remained >90% on RA      Pertinent Vitals/Pain Pain Assessment: Faces Faces Pain Scale: Hurts little more Pain Location: R hip Pain Descriptors / Indicators: Aching;Sore Pain Intervention(s): Monitored during session;Limited activity within patient's tolerance    Home Living                      Prior Function            PT Goals (current goals can now be found in the care plan section) Acute Rehab PT Goals Patient Stated Goal: Drink some coffee Progress towards PT goals: Progressing toward goals    Frequency    Min 2X/week      PT Plan Current plan remains appropriate    Co-evaluation              AM-PAC PT "6 Clicks" Daily Activity  Outcome Measure  Difficulty turning over in bed (including adjusting bedclothes, sheets and blankets)?: None Difficulty moving from lying on back to sitting on the side of the bed? : None Difficulty sitting down on and standing up from a chair with arms (e.g., wheelchair, bedside commode, etc,.)?: Unable Help needed moving to and from a bed to chair (including a wheelchair)?: A Little Help needed walking in hospital room?: A Lot Help needed climbing 3-5 steps with a railing? : A Lot 6 Click Score: 16    End of Session Equipment Utilized During Treatment: Gait belt Activity Tolerance: Patient tolerated treatment well;Patient limited by fatigue Patient left: in chair;with call bell/phone within reach;with family/visitor present Nurse Communication: Mobility status PT Visit Diagnosis: Other abnormalities of gait and mobility (R26.89);Muscle weakness (generalized) (M62.81)     Time: 4270-6237 PT Time Calculation (min) (ACUTE ONLY): 23 min  Charges:  $Therapeutic Exercise: 8-22 mins $Therapeutic Activity: 8-22 mins                    G Codes:      Mabeline Caras, PT, DPT Acute Rehab Services  Pager: [place] 04/28/2017, 12:09 PM

## 2017-04-28 NOTE — Progress Notes (Addendum)
SLP Cancellation Note  Patient Details Name: Rebecca [name] MRN: [id number] DOB: 09/10/1940   Cancelled treatment:       Reason Eval/Treat Not Completed: Patient at procedure or test/unavailable. No family present at this time. Will continue efforts.  Celia B. Quentin Ore [facility], CCC-SLP Speech Language Pathologist [phone]  Shonna Chock 04/28/2017, 9:24 AM

## 2017-04-28 NOTE — Plan of Care (Signed)
  SLP Dysphagia Goals Misc Dysphagia Goal 04/28/2017 1307 by Colon Flattery B, CCC-SLP Flowsheets Taken 04/28/2017 1307  Misc Dysphagia Goal  Pt will tolerate least restrictive diet without overt s/s aspiration or decline in respiratory status 04/28/2017 1306 - Progressing by Lorre Nick, CCC-SLP   SLP Dysphagia Goals Patient will utilize recommended strategies Description Patient will utilize recommended strategies during swallow to increase swallowing safety with 04/28/2017 1307 by Colon Flattery B, CCC-SLP Flowsheets Taken 04/28/2017 1307  Patient will utilize recommended strategies during swallow to increase swallowing safety with  mod assist

## 2017-04-28 NOTE — Progress Notes (Signed)
-   Barium swallow report reviewed. No esophageal stricture. - Advance diet per speech pathologist  Recommendations  - GI will sign off. Call us back if needed  Otis Brace MD, [place] 04/28/2017, 11:09 AM  Contact #  [phone]

## 2017-04-28 NOTE — Evaluation (Signed)
Clinical/Bedside Swallow Evaluation Patient Details  Name: Rebecca [name] MRN: [id number] Date of Birth: 1940-12-12  Today's Date: 04/28/2017 Time: SLP Start Time (ACUTE ONLY): 1150 SLP Stop Time (ACUTE ONLY): 1245 SLP Time Calculation (min) (ACUTE ONLY): 55 min  Past Medical History:  Past Medical History:  Diagnosis Date  . AKI (acute kidney injury) ([place])   . Anemia in chronic renal disease   . Aortic atherosclerosis ([place]) 01/27/2017  . CAD (coronary artery disease) 2006   a. s/p DES to RCA in 2006 b. low-risk NST in 2014 c. cath in 08/2015 showing patent RCA stent with nonobstructive disease  . CKD (chronic kidney disease) stage 4, GFR 15-29 ml/min ([facility])   . Complication of anesthesia   . COPD (chronic obstructive pulmonary disease) ([place])   . Diabetes mellitus   . Diastolic heart failure, NYHA class 1 ([place])   . Dyslipidemia   . Dysuria 01/06/2015  . Frozen shoulder    right  . Gastroesophageal reflux   . HAP (hospital-acquired pneumonia)   . Headache   . Hyperkalemia 01/01/2016  . Hypothyroidism   . 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 Obesity   . Pancreatic mass 01/27/2017  . Pneumonia 06/2016  . Polymyalgia rheumatica ([place])   . PONV (postoperative nausea and vomiting)   . Renal insufficiency   . Respiratory distress   . Schizoaffective disorder ([place])   . Sepsis due to pneumonia ([place])   . Shortness of breath   . Stroke [facility])    Past Surgical History:  Past Surgical History:  Procedure Laterality Date  . ABDOMINAL HYSTERECTOMY    . CARDIAC CATHETERIZATION N/A 08/13/2015   Procedure: Left Heart Cath and Coronary Angiography;  Surgeon: Jettie Booze, MD;  Location: [place] [facility];  Service: Cardiovascular;  Laterality: N/A;  . CATARACT EXTRACTION    . CHOLECYSTECTOMY    . CORONARY ANGIOPLASTY WITH STENT PLACEMENT  2006   [place] DES to RCA, 50 % residual  . PARTIAL HYSTERECTOMY  1995   HPI:  Rebecca [name] a 76 y.o.femalewith  medical history significant ofCOPD, pna,PMR, CAD, CVA, GERD, pancreatic mass, barium esophagram revealing mild esophageal dysmotility, puree diet at baseline (thin liquids), schizoaffective disorder, CKD stage IV, diastolic heart failure, MGUS, hypothyroidism who was most recently admitted at [facility] long hospital secondary to right-sided trochanteric bursitis, acute on chronic diastolic heart failure and healthcare acquired pneumonia. This admission found to have healthcare associated pna). MBS 09/2016 reveaked suspected primary esophageal deficits, pharyngeal residue, trace penetration thin to cords silent while consuming barium pill. Dys 3, thin recommended. New s/s aspiration exhibited 04/27/17 with recommendation for full objective assessment.   MBS completed 04/27/17 with rec for NPO status, due to penetration and aspiration of thin liquids, penetration of nectar and honey thick liquids with inconsistent reflexive cough response. Vallecular residue post swallow of puree, but no penetration seen.   Assessment / Plan / Recommendation Clinical Impression  New order for repeat BSE received. SLP met with pt/daughter regarding results from [place] completed yesterday (04/27/17), specifically inconsistent cough response to penetration and aspiration of multiple consistencies. Per MBS, puree consistency was not penetrated or aspirated, however, pt is at increased aspiration risk due to post-swallow residue in the vallecular sinus, which may fill and thin out with addition of secretions.   At this time, recommend cautious provision of puree consistencies and meds with puree, but NO LIQUIDS at this time due to airway compromise documented on MBS yesterday. Will repeat MBS Friday  04/29/17 to objectively reassess swallow function and safety, and identify least restrictive diet. Daughter voiced willingness to consider short term non-oral feeding method if necessary, but indicated no desire for long term feeding tube  placement. MD and RN informed of recommendations, and safe swallow precautions were reviewed and posted at [facility]. ST will complete repeat MBS tomorrow.   SLP Visit Diagnosis: Dysphagia, oropharyngeal phase (R13.12)    Aspiration Risk  Moderate aspiration risk;Severe aspiration risk    Diet Recommendation No liquids;Dysphagia 1 (Puree)   Medication Administration: Whole meds with puree Supervision: Patient able to self feed;Staff to assist with self feeding;Full supervision/cueing for compensatory strategies Compensations: Minimize environmental distractions;Small sips/bites;Slow rate;Clear throat intermittently;Multiple dry swallows after each bite/sip Postural Changes: Seated upright at 90 degrees;Remain upright for at least 30 minutes after po intake    Other  Recommendations Oral Care Recommendations: Oral care BID Other Recommendations: Have oral suction available   Follow up Recommendations 24 hour supervision/assistance      Frequency and Duration   pending MBS results/recommendations         Prognosis Prognosis for Safe Diet Advancement: Fair Barriers to Reach Goals: Severity of deficits      Swallow Study   General Date of Onset: 04/24/17 HPI: Rebecca [name] a 76 y.o.femalewith medical history significant ofCOPD, pna,PMR, CAD, CVA, pna, GERD, pancreatic mass, barium esophagram revealing mild esophageal dysmotility, puree diet at baseline (thin liquids), schizoaffective disorder, CKD stage IV, diastolic heart failure, MGUS, hypothyroidism who was most recently admitted at [facility] long hospital secondary to right-sided trochanteric bursitis, acute on chronic diastolic heart failure and healthcare acquired pneumonia. This admission found to have healthcare associated pna). MBS 09/2016 reveaked suspected primary esophageal deficits, pharyngeal residue, trace penetration thin to cords silent while consuming barium pill. Dys 3, thin recommended. New s/s aspiraiton exhibited 11/14  with recommendation for full objective assessment.   Type of Study: Bedside Swallow Evaluation Previous Swallow Assessment: see HPI Diet Prior to this Study: NPO Respiratory Status: Room air History of Recent Intubation: No Behavior/Cognition: Alert;Cooperative;Lethargic/Drowsy Oral Cavity Assessment: Excessive secretions Oral Care Completed by SLP: No Oral Cavity - Dentition: Edentulous Vision: Functional for self-feeding Self-Feeding Abilities: Needs assist Patient Positioning: Upright in chair Baseline Vocal Quality: Low vocal intensity Volitional Cough: Weak;Congested(non productive) Volitional Swallow: Able to elicit    Oral/Motor/Sensory Function Overall Oral Motor/Sensory Function: Mild impairment(generalized oral weakness)   Ice Chips Ice chips: Not tested   Thin Liquid Thin Liquid: Not tested    Nectar Thick Nectar Thick Liquid: Not tested   Honey Thick Honey Thick Liquid: Not tested   Puree Puree: Within functional limits Presentation: Spoon   Solid   GO   Solid: Not tested       Jevon Shells B. Quentin Ore, [facility], CCC-SLP Speech Language Pathologist [phone]  Shonna Chock 04/28/2017,1:01 PM

## 2017-04-28 NOTE — Progress Notes (Signed)
Occupational Therapy Treatment Patient Details Name: Rebecca [name] MRN: [id number] DOB: 1940/12/15 Today's Date: 04/28/2017    History of present illness Pt is a 76 y.o. female admitted on 04/24/17 with workup for sepsis secondary to HCAP and acute hypoxemic respiratory failure. Pertinent PMH includes COPD, CAD, HTN, CKD IV, diastolic HF, polymyalgia rheumatica, Chronic hip pain secondary to L5-S1 spondylosis, right trochanteric bursitis (admitted 03/14/17 for hip pain; steroid injection on 03/24/17 with Dr. Ninfa Linden). Of note, recently admitted 10/1-10/13 for HCAP and treated with vanco/zosyn.   OT comments  Pt demonstrates improving activity tolerance and functional mobility.  She is able to perform ADLs at min A level and min A to ambulate 5' to [facility].  Continue to recommend SNF.   Follow Up Recommendations  SNF;Supervision/Assistance - 24 hour    Equipment Recommendations  None recommended by OT    Recommendations for Other Services      Precautions / Restrictions Precautions Precautions: Fall Restrictions Weight Bearing Restrictions: No       Mobility Bed Mobility Overal bed mobility: Needs Assistance Bed Mobility: Sit to Supine Rolling: Supervision Sidelying to sit: Supervision   Sit to supine: Supervision   General bed mobility comments: Prefers getting out on R-side of bed  Transfers Overall transfer level: Needs assistance Equipment used: Rolling walker (2 wheeled) Transfers: Sit to/from [facility] Sit to Stand: Min assist Stand pivot transfers: Min assist       General transfer comment: min A for balance and intermittently for sit to stand     Balance Overall balance assessment: Needs assistance Sitting-balance support: No upper extremity supported Sitting balance-Leahy Scale: Good     Standing balance support: Bilateral upper extremity supported;During functional activity Standing balance-Leahy Scale: Poor Standing balance comment:  Reliant on BUE support                           ADL either performed or assessed with clinical judgement   ADL Overall ADL's : Needs assistance/impaired             Lower Body Bathing: Minimal assistance;Sit to/from stand       Lower Body Dressing: Minimal assistance;Sit to/from stand   Toilet Transfer: Minimal assistance;Stand-pivot;Ambulation;BSC;RW Toilet Transfer Details (indicate cue type and reason): Min A for sit to stand and for balance          Functional mobility during ADLs: Minimal assistance;Rolling walker General ADL Comments: assist for balance      Vision       Perception     Praxis      Cognition Arousal/Alertness: Awake/alert Behavior During Therapy: WFL for tasks assessed/performed Overall Cognitive Status: History of cognitive impairments - at baseline Area of Impairment: Attention;Memory;Following commands;Safety/judgement;Problem solving                   Current Attention Level: Selective Memory: Decreased short-term memory Following Commands: Follows multi-step commands with increased time Safety/Judgement: Decreased awareness of safety;Decreased awareness of deficits   Problem Solving: Requires verbal cues General Comments: Pt cognition improved overall this session, more alert and better able to follow commands; continues to require repeated cues for safety and technique. Daughter present and condones baseline cognitive deficits        Exercises     Shoulder Instructions       General Comments VSS.  Daughter present     Pertinent Vitals/ Pain       Pain Assessment: Faces Faces Pain Scale:  Hurts little more Pain Location: R hip Pain Descriptors / Indicators: Aching;Sore Pain Intervention(s): Monitored during session;Repositioned  Home Living                                          Prior Functioning/Environment              Frequency  Min 2X/week        Progress Toward  Goals  OT Goals(current goals can now be found in the care plan section)  Progress towards OT goals: Progressing toward goals  Acute Rehab OT Goals Patient Stated Goal: Drink some coffee  Plan Discharge plan remains appropriate    Co-evaluation                 AM-PAC PT "6 Clicks" Daily Activity     Outcome Measure   Help from another person eating meals?: A Little Help from another person taking care of personal grooming?: A Little Help from another person toileting, which includes using toliet, bedpan, or urinal?: A Little Help from another person bathing (including washing, rinsing, drying)?: A Little Help from another person to put on and taking off regular upper body clothing?: A Little Help from another person to put on and taking off regular lower body clothing?: A Little 6 Click Score: 18    End of Session Equipment Utilized During Treatment: Rolling walker  OT Visit Diagnosis: Other abnormalities of gait and mobility (R26.89);Muscle weakness (generalized) (M62.81);Other symptoms and signs involving cognitive function   Activity Tolerance Patient tolerated treatment well   Patient Left in bed;with call bell/phone within reach;with bed alarm set   Nurse Communication Mobility status        Time: 0223-3612 OT Time Calculation (min): 41 min  Charges: OT General Charges $OT Visit: 1 Visit OT Treatments $Self Care/Home Management : 38-52 mins  [facility], OTR/L [phone]    Lucille Passy M 04/28/2017, 3:25 PM

## 2017-04-28 NOTE — Progress Notes (Signed)
[facility] Progress Note  Rebecca [name] 76 y.o. June 10, 1941  CC:  Oropharyngeal dysphagia, aspiration pneumonia   Subjective: Patient is sitting in the recliner comfortably. Daughter at bedside. I have discussed with Mickel Baas  from speech pathologist yesterday. Patient may have some risk of aspiration with a barium swallow tarry. Aspiration risks discussed with the daughter. She still prefers noninvasive workup.  ROS : Not able to obtain   Objective: Vital signs in last 24 hours: Vitals:   04/28/17 0802 04/28/17 0821  BP: (!) 164/67 (!) 164/67  Pulse: 96 96  Resp: 18 18  Temp: 99.1 F (37.3 C)   SpO2: 96% 97%    Physical Exam:  General:   Elderly patient resting comfortably in the bed. Lungs:  Coarse breath sounds bilaterally Heart:  Regular rate and rhythm; no murmurs, clicks, rubs,  or gallops. Abdomen: Soft, mild fullness in right upper quadrant, nontender, bowel sounds present. No peritoneal signs  Psych : Not able to evaluate LE : Petechiae at bilateral lower extremity. No significant edema    Lab Results: Recent Labs    04/27/17 0703 04/28/17 0420  NA 136 140  K 4.0 3.9  CL 108 111  CO2 16* 21*  GLUCOSE 179* 167*  BUN 79* 68*  CREATININE 2.60* 2.17*  CALCIUM 8.4* 8.3*   No results for input(s): AST, ALT, ALKPHOS, BILITOT, PROT, ALBUMIN in the last 72 hours. Recent Labs    04/27/17 0703 04/28/17 0420  WBC 25.7* 18.3*  HGB 9.4* 8.4*  HCT 27.2* 24.3*  MCV 88.0 88.4  PLT 179 192   No results for input(s): LABPROT, INR in the last 72 hours.    Assessment/Plan: - Oropharyngeal dysphagia - Abnormal modified barium swallow concerning for residue in the mid to distal esophagus. - Aspiration pneumonia - Coronary artery disease with history of TIA. Plavix on hold because of nothing by mouth status. 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 Patient was scheduled for EUS by Dr. Ardis Hughs but  subsequently she was admitted with weakness multiple medical issues . According to family, they decided not to pursue further investigation of her pancreatic cyst.   Recommendations -------------------------- - Long discussion with the daughter regarding barium swallow as well as EGD. Risk of aspiration with a barium swallow also discussed. Daughter prefers Noninvasive workup at this point.  discussed with Dr. Posey Pronto at radiology. They will probably use water soluble contrast.  - GI will follow     Otis Brace MD, FACP 04/28/2017, 8:35 AM  Contact #  [phone]

## 2017-04-29 ENCOUNTER — Inpatient Hospital Stay (HOSPITAL_COMMUNITY): Payer: Medicare Other

## 2017-04-29 ENCOUNTER — Other Ambulatory Visit: Payer: Self-pay

## 2017-04-29 DIAGNOSIS — N179 Acute kidney failure, unspecified: Secondary | ICD-10-CM

## 2017-04-29 DIAGNOSIS — Z7189 Other specified counseling: Secondary | ICD-10-CM

## 2017-04-29 DIAGNOSIS — N189 Chronic kidney disease, unspecified: Secondary | ICD-10-CM

## 2017-04-29 DIAGNOSIS — Z515 Encounter for palliative care: Secondary | ICD-10-CM

## 2017-04-29 LAB — BASIC METABOLIC PANEL WITH GFR
Anion gap: 9 (ref 5–15)
BUN: 60 mg/dL — ABNORMAL HIGH (ref 6–20)
CO2: 21 mmol/L — ABNORMAL LOW (ref 22–32)
Calcium: 8.3 mg/dL — ABNORMAL LOW (ref 8.9–10.3)
Chloride: 114 mmol/L — ABNORMAL HIGH (ref 101–111)
Creatinine, Ser: 1.85 mg/dL — ABNORMAL HIGH (ref 0.44–1.00)
GFR calc Af Amer: 29 mL/min — ABNORMAL LOW
GFR calc non Af Amer: 25 mL/min — ABNORMAL LOW
Glucose, Bld: 117 mg/dL — ABNORMAL HIGH (ref 65–99)
Potassium: 4.1 mmol/L (ref 3.5–5.1)
Sodium: 144 mmol/L (ref 135–145)

## 2017-04-29 LAB — GLUCOSE, CAPILLARY
GLUCOSE-CAPILLARY: 101 mg/dL — AB (ref 65–99)
GLUCOSE-CAPILLARY: 104 mg/dL — AB (ref 65–99)
GLUCOSE-CAPILLARY: 164 mg/dL — AB (ref 65–99)
Glucose-Capillary: 101 mg/dL — ABNORMAL HIGH (ref 65–99)
Glucose-Capillary: 155 mg/dL — ABNORMAL HIGH (ref 65–99)

## 2017-04-29 LAB — CBC
HCT: 25.4 % — ABNORMAL LOW (ref 36.0–46.0)
Hemoglobin: 8.5 g/dL — ABNORMAL LOW (ref 12.0–15.0)
MCH: 30.1 pg (ref 26.0–34.0)
MCHC: 33.5 g/dL (ref 30.0–36.0)
MCV: 90.1 fL (ref 78.0–100.0)
PLATELETS: 185 10*3/uL (ref 150–400)
RBC: 2.82 MIL/uL — ABNORMAL LOW (ref 3.87–5.11)
RDW: 14.1 % (ref 11.5–15.5)
WBC: 17.9 10*3/uL — AB (ref 4.0–10.5)

## 2017-04-29 LAB — CULTURE, BLOOD (ROUTINE X 2)
CULTURE: NO GROWTH
Culture: NO GROWTH

## 2017-04-29 MED ORDER — LEVOTHYROXINE SODIUM 75 MCG PO TABS
75.0000 ug | ORAL_TABLET | Freq: Every day | ORAL | Status: DC
  Administered 2017-04-30 – 2017-05-05 (×6): 75 ug
  Filled 2017-04-29 (×6): qty 1

## 2017-04-29 MED ORDER — CLOPIDOGREL BISULFATE 75 MG PO TABS: 75.0000 mg | ORAL_TABLET | Freq: Every day | ORAL | Status: DC

## 2017-04-29 MED ORDER — PRAVASTATIN SODIUM 40 MG PO TABS: 20.0000 mg | ORAL_TABLET | Freq: Every day | ORAL | Status: DC

## 2017-04-29 MED ORDER — SODIUM CHLORIDE 0.9 % IV SOLN
INTRAVENOUS | Status: AC
  Administered 2017-04-29: 23:00:00 via INTRAVENOUS

## 2017-04-29 MED ORDER — MEROPENEM 1 G IV SOLR
1.0000 g | Freq: Two times a day (BID) | INTRAVENOUS | Status: DC
  Administered 2017-04-29 – 2017-05-02 (×6): 1 g via INTRAVENOUS
  Filled 2017-04-29 (×6): qty 1

## 2017-04-29 MED ORDER — PRAVASTATIN SODIUM 20 MG PO TABS
20.0000 mg | ORAL_TABLET | Freq: Every day | ORAL | Status: DC
  Administered 2017-04-29 – 2017-05-04 (×6): 20 mg
  Filled 2017-04-29 (×6): qty 1

## 2017-04-29 MED ORDER — GUAIFENESIN 200 MG PO TABS
400.0000 mg | ORAL_TABLET | Freq: Three times a day (TID) | ORAL | Status: DC
  Administered 2017-04-29 – 2017-05-05 (×17): 400 mg
  Filled 2017-04-29 (×20): qty 2

## 2017-04-29 MED ORDER — CLOTRIMAZOLE 1 % EX CREA
TOPICAL_CREAM | Freq: Two times a day (BID) | CUTANEOUS | Status: DC
  Administered 2017-04-29: 23:00:00 via TOPICAL
  Administered 2017-04-30: 1 via TOPICAL
  Administered 2017-04-30 – 2017-05-10 (×18): via TOPICAL
  Filled 2017-04-29 (×3): qty 15

## 2017-04-29 MED ORDER — ACETAMINOPHEN 650 MG RE SUPP: 650.0000 mg | Freq: Four times a day (QID) | RECTAL | Status: DC | PRN

## 2017-04-29 MED ORDER — CLOPIDOGREL BISULFATE 75 MG PO TABS
75.0000 mg | ORAL_TABLET | Freq: Every day | ORAL | Status: DC
  Administered 2017-04-29 – 2017-05-05 (×7): 75 mg
  Filled 2017-04-29 (×7): qty 1

## 2017-04-29 MED ORDER — NEPRO/CARBSTEADY PO LIQD
1000.0000 mL | ORAL | Status: DC
  Administered 2017-04-29 – 2017-05-05 (×4): 1000 mL
  Filled 2017-04-29 (×10): qty 1000

## 2017-04-29 MED ORDER — CLOZAPINE 100 MG PO TABS
300.0000 mg | ORAL_TABLET | Freq: Every day | ORAL | Status: DC
  Administered 2017-04-29 – 2017-05-05 (×7): 300 mg
  Filled 2017-04-29 (×8): qty 3

## 2017-04-29 MED ORDER — COLCHICINE 0.6 MG PO TABS
0.3000 mg | ORAL_TABLET | Freq: Every day | ORAL | Status: DC
  Administered 2017-04-29 – 2017-05-05 (×7): 0.3 mg
  Filled 2017-04-29 (×7): qty 1

## 2017-04-29 MED ORDER — DILTIAZEM 12 MG/ML ORAL SUSPENSION
60.0000 mg | Freq: Four times a day (QID) | ORAL | Status: DC
  Administered 2017-04-29 – 2017-05-06 (×24): 60 mg
  Filled 2017-04-29 (×32): qty 6

## 2017-04-29 MED ORDER — SENNOSIDES-DOCUSATE SODIUM 8.6-50 MG PO TABS
2.0000 | ORAL_TABLET | Freq: Two times a day (BID) | ORAL | Status: DC
  Administered 2017-04-29 – 2017-05-04 (×10): 2
  Filled 2017-04-29 (×12): qty 2

## 2017-04-29 MED ORDER — METOPROLOL SUCCINATE ER 100 MG PO TB24
100.0000 mg | ORAL_TABLET | Freq: Every day | ORAL | Status: DC
  Filled 2017-04-29: qty 1

## 2017-04-29 MED ORDER — SENNOSIDES-DOCUSATE SODIUM 8.6-50 MG PO TABS
2.0000 | ORAL_TABLET | Freq: Two times a day (BID) | ORAL | Status: DC
  Administered 2017-04-29: 2 via ORAL
  Filled 2017-04-29: qty 2

## 2017-04-29 MED ORDER — DILTIAZEM HCL ER COATED BEADS 240 MG PO CP24: 240.0000 mg | ORAL_CAPSULE | Freq: Every day | ORAL | Status: DC

## 2017-04-29 MED ORDER — POLYETHYLENE GLYCOL 3350 17 G PO PACK
17.0000 g | PACK | Freq: Two times a day (BID) | ORAL | Status: DC
  Administered 2017-04-30 – 2017-05-05 (×8): 17 g
  Filled 2017-04-29 (×11): qty 1

## 2017-04-29 MED ORDER — MILK AND MOLASSES ENEMA
1.0000 | Freq: Once | RECTAL | Status: AC
  Administered 2017-04-29: 250 mL via RECTAL
  Filled 2017-04-29: qty 250

## 2017-04-29 MED ORDER — TRAMADOL HCL 50 MG PO TABS
50.0000 mg | ORAL_TABLET | Freq: Two times a day (BID) | ORAL | Status: DC | PRN
  Administered 2017-04-30: 50 mg
  Filled 2017-04-29: qty 1

## 2017-04-29 MED ORDER — ACETAMINOPHEN 325 MG PO TABS
650.0000 mg | ORAL_TABLET | Freq: Four times a day (QID) | ORAL | Status: DC | PRN
  Administered 2017-04-30 – 2017-05-11 (×16): 650 mg
  Filled 2017-04-29 (×17): qty 2

## 2017-04-29 MED ORDER — METOPROLOL TARTRATE 25 MG/10 ML ORAL SUSPENSION
50.0000 mg | Freq: Two times a day (BID) | ORAL | Status: DC
  Administered 2017-04-29 – 2017-05-05 (×12): 50 mg
  Filled 2017-04-29 (×13): qty 20

## 2017-04-29 MED ORDER — LEVOTHYROXINE SODIUM 75 MCG PO TABS
75.0000 ug | ORAL_TABLET | Freq: Every day | ORAL | Status: DC
  Administered 2017-04-29: 75 ug via ORAL
  Filled 2017-04-29: qty 1

## 2017-04-29 MED ORDER — SACCHAROMYCES BOULARDII 250 MG PO CAPS
250.0000 mg | ORAL_CAPSULE | Freq: Two times a day (BID) | ORAL | Status: DC
  Administered 2017-04-29 – 2017-05-05 (×12): 250 mg
  Filled 2017-04-29 (×13): qty 1

## 2017-04-29 NOTE — Consult Note (Signed)
Consultation Note Date: 04/29/2017   Patient Name: Rebecca [name]  DOB: 1941/04/03  MRN: [id number]  Age / Sex: 76 y.o., female  PCP: [name], MD Referring Physician: Modena Jansky, MD  Reason for Consultation: Establishing goals of care  HPI/Patient Profile: 76 y.o. female  with past medical history of COPD, PMR on chronic prednisone, CAD, diastolic CHF, CKD stage III, MGUS, recent admission to [facility] with right-sided trochanteric bursitis and went to [facility] for rehab but had returned home recently with daughter. Was admitted on 04/24/2017 with lethargy and fever r/t UTI and aspiration pneumonia. Has struggled with worsening dysphagia and have proceeded with temporary Cortrak placement to allow for rest for esophagitis and SLP will reassess swallow function after the weekend.   Clinical Assessment and Goals of Care: I met today with Rebecca [name] (sleeping on/off during conversation) and Investment banker, operational, Event organiser. Rebecca [name] remains hopeful for improvement as she said her mother did well after rehab and was ambulating with walker at home and eating fairly well.   Discussed with Rebecca [name] who shares that her mother would not desire long term feeding tube. She is very concerned with QOL and comfort but also wishing for measures that will improve her mother's help. They did briefly have hospice care when they believed that Rebecca [name] had pancreatic cancer (which seemed in further testing to be a cyst) but was released from hospice.   We also discussed code status and resuscitation. Rebecca [name] is torn with this as she has had a bad experience where a friend was DNR and she felt they did not get the care they otherwise deserved d/t being DNR. Rebecca [name] certainly would not desire for her mother to be resuscitated BUT will NOT agree to DNR d/t this experience. We did discuss the option to have yellow outpatient DNR for her to have  at home to prevent putting her mother through CPR and Rebecca [name] will consider this option.   I will follow up on Tuesday after Cortak trial and SLP reassessment. If palliative needs occur before Tuesday please call - Rebecca [name] is aware of this as well.    Primary Decision Maker PATIENT, dtr [place] for improvement - Cortrak trial with esophagus rest over the weekend  Code Status/Advance Care Planning:  Full code   Symptom Management:   Added probiotic as requested by Rebecca [name].   Also added Lotrimin cream for beginning yeast under left breast. May be used under right breast also as needed.   Constipation: To receive milk of molasses enema today. Continue senokot 2 tabs 2x daily. Hopefully will improve with tube feeds as well.   Palliative Prophylaxis:   Aspiration, Bowel Regimen, Delirium Protocol, Frequent Pain Assessment, Oral Care and Turn Reposition  Additional Recommendations (Limitations, Scope, Preferences):  Full Scope Treatment  Psycho-social/Spiritual:   Desire for further Chaplaincy support:yes  Additional Recommendations: Caregiving  Support/Resources  Prognosis:   Unable to determine  Discharge Planning: To Be Determined      Primary Diagnoses:  Present on Admission: . (Resolved) HCAP (healthcare-associated pneumonia) . Aspiration pneumonia ([place]) . TIA (transient ischemic attack) . Schizoaffective disorder ([place]) . Recurrent UTI . Polymyalgia rheumatica ([place]) . Lobar pneumonia, unspecified organism ([place]) . Hypothyroidism . GASTROESOPHAGEAL REFLUX, NO ESOPHAGITIS . Essential hypertension   I have reviewed the medical record, interviewed the patient and family, and examined the patient. The following aspects are pertinent.  Past Medical History:  Diagnosis Date  . AKI (acute kidney injury) ([place])   . Anemia in chronic renal disease   . Aortic atherosclerosis ([place]) 01/27/2017  . CAD (coronary artery disease)  2006   a. s/p DES to RCA in 2006 b. low-risk NST in 2014 c. cath in 08/2015 showing patent RCA stent with nonobstructive disease  . CKD (chronic kidney disease) stage 4, GFR 15-29 ml/min (HCC)   . Complication of anesthesia   . COPD (chronic obstructive pulmonary disease) ([place])   . Diabetes mellitus   . Diastolic heart failure, NYHA class 1 ([place])   . Dyslipidemia   . Dysuria 01/06/2015  . Frozen shoulder    right  . Gastroesophageal reflux   . HAP (hospital-acquired pneumonia)   . Headache   . Hyperkalemia 01/01/2016  . Hypothyroidism   . 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 Obesity   . Pancreatic mass 01/27/2017  . Pneumonia 06/2016  . Polymyalgia rheumatica ([place])   . PONV (postoperative nausea and vomiting)   . Renal insufficiency   . Respiratory distress   . Schizoaffective disorder ([place])   . Sepsis due to pneumonia ([place])   . Shortness of breath   . Stroke [facility]    Social History   Socioeconomic History  . Marital status: Divorced    Spouse name: None  . Number of children: 3  . Years of education: 9th  . Highest education level: None  Social Needs  . Financial resource strain: None  . Food insecurity - worry: None  . Food insecurity - inability: None  . Transportation needs - medical: None  . Transportation needs - non-medical: None  Occupational History  . Occupation: homemaker  Tobacco Use  . Smoking status: Former Smoker    Packs/day: 1.00    Years: 35.00    Pack years: 35.00    Last attempt to quit: 04/14/2004    Years since quitting: 13.0  . Smokeless tobacco: Never Used  Substance and Sexual Activity  . Alcohol use: No  . Drug use: No  . Sexual activity: No  Other Topics Concern  . None  Social History Narrative   Daughter Rebecca [name] and son are very involved.     Rebecca [name] comes to most office visits.   She now lives at home with daughter Rebecca [name]   Daughter is HCPOA, not averse to SNF/rehab in short-term, but does not wish pt  to return to [facility] and requests that pt/pt's care not be discussed by [facility] providers to [facility], moving forward.   Caffeine Use: 1-3 cups daily   Family History  Problem Relation Age of Onset  . Diabetes Mother   . Heart disease Mother   . Heart disease Father   . Arthritis Father   . Healthy Sister   . Heart disease Brother   . Cancer Brother        colon ca  . Osteoporosis Sister   . Heart disease Brother   . Diabetes Brother   . Heart disease Brother   . Diabetes Brother   . Diabetes Brother   .  Healthy Brother   . Healthy Brother   . Healthy Brother   . Cancer Maternal Uncle        blood condition  . Cancer Daughter        breast ca  . Cancer Cousin        NHL   Scheduled Meds: . clopidogrel  75 mg Per Tube QHS  . clotrimazole   Topical BID  . cloZAPine  300 mg Per Tube QHS  . colchicine  0.3 mg Per Tube QHS  . dexamethasone  10 mg Intravenous Q24H  . diltiazem  60 mg Per Tube Q6H  . feeding supplement (NEPRO CARB STEADY)  1,000 mL Per Tube Q24H  . guaiFENesin  400 mg Per Tube TID  . heparin  5,000 Units Subcutaneous Q8H  . ipratropium-albuterol  3 mL Inhalation TID  . isosorbide mononitrate  60 mg Oral Daily  . [START ON 04/30/2017] levothyroxine  75 mcg Per Tube QAC breakfast  . metoprolol tartrate  50 mg Per Tube BID  . milk and molasses  1 enema Rectal Once  . mometasone-formoterol  2 puff Inhalation BID  . pantoprazole (PROTONIX) IV  40 mg Intravenous Q24H  . polyethylene glycol  17 g Per Tube BID  . pravastatin  20 mg Per Tube QHS  . saccharomyces boulardii  250 mg Per Tube BID  . senna-docusate  2 tablet Per Tube BID  . tamsulosin  0.4 mg Oral Q breakfast   Continuous Infusions: . sodium chloride 50 mL/hr at 04/29/17 0817  . meropenem (MERREM) IV     PRN Meds:.acetaminophen **OR** acetaminophen, fluticasone, ondansetron **OR** ondansetron (ZOFRAN) IV, traMADol Allergies  Allergen Reactions  . Levaquin [Levofloxacin] Other (See  Comments)    Ruptured tendon  . Tape Other (See Comments)    PATIENT'S SKIN IS VERY, VERY THIN AND WILL TEAR AND BRUISE EASILY; PLEASE USE AN ALTERNATIVE (SOMETHING OTHER THAN TAPE)   Review of Systems  Constitutional: Positive for activity change, appetite change and fatigue.  HENT: Positive for trouble swallowing.   Respiratory: Positive for cough.   Gastrointestinal: Positive for constipation.  Neurological: Positive for weakness.    Physical Exam  Constitutional: She is oriented to person, place, and time. She appears well-developed. She appears lethargic. She appears ill.  Frail, elderly  HENT:  Head: Normocephalic and atraumatic.  Cardiovascular: Normal rate and regular rhythm.  Pulmonary/Chest: Effort normal. No accessory muscle usage. No tachypnea. No respiratory distress.  Abdominal: Soft. Normal appearance.  Neurological: She is oriented to person, place, and time. She appears lethargic.  Skin:  Extensive bruising to bilat arms (chronic steroid use + anticoagulation). Skin thin and frail.   Nursing note and vitals reviewed.   Vital Signs: BP 140/81   Pulse 87   Temp 99 F (37.2 C) (Oral)   Resp (!) 22   Ht 5' 3"  (1.6 m)   Wt 59.9 kg (132 lb)   SpO2 97%   BMI 23.38 kg/m  Pain Assessment: No/denies pain POSS *See Group Information*: S-Acceptable,Sleep, easy to arouse Pain Score: 0-No pain   SpO2: SpO2: 97 % O2 Device:SpO2: 97 % O2 Flow Rate: .O2 Flow Rate (L/min): 3 L/min  IO: Intake/output summary:   Intake/Output Summary (Last 24 hours) at 04/29/2017 1400 Last data filed at 04/29/2017 0800 Gross per 24 hour  Intake 1170 ml  Output 1150 ml  Net 20 ml    LBM: Last BM Date: 04/23/17 Baseline Weight: Weight: 59.9 kg (132 lb) Most recent weight: Weight:  59.9 kg (132 lb)     Palliative Assessment/Data: 30%    Time Total: 70 min  Greater than 50%  of this time was spent counseling and coordinating care related to the above assessment and  plan.  Signed by: Vinie Sill, NP Palliative Medicine Team Pager # [phone] (M-F 8a-5p) Team Phone # [phone] (Nights/Weekends)

## 2017-04-29 NOTE — Progress Notes (Signed)
  Speech Language Pathology Treatment: Dysphagia  Patient Details Name: Rebecca [name] MRN: [id number] DOB: Nov 29, 1940 Today's Date: 04/29/2017 Time: 0947-0962 SLP Time Calculation (min) (ACUTE ONLY): 8 min  Assessment / Plan / Recommendation Clinical Impression  Chart reviewed from yesterday (SLP; esophagram yesterday, recommendation from colleague for repeat MBS, no NGT placed). Observed with yogurt and magic cup with wet vocal quality and multiple swallows. Pt required moderate verbal reminders for compensatory strategy for throat clear/cough). Repeat MBS is not warranted at present as there are no significant improvements from Wed re: swallow function and pt would benefit from Cortrak due to insufficient oral intake. Pt may continue puree/pudding liquids as able- reporting pain in chest when eating. ST to continue intervention and will recommend repeat MBS when appropriate.      HPI HPI: Rebecca [name] a 76 y.o.femalewith medical history significant ofCOPD, pna,PMR, CAD, CVA, pna, GERD, pancreatic mass, barium esophagram revealing mild esophageal dysmotility, puree diet at baseline (thin liquids), schizoaffective disorder, CKD stage IV, diastolic heart failure, MGUS, hypothyroidism who was most recently admitted at [facility] long hospital secondary to right-sided trochanteric bursitis, acute on chronic diastolic heart failure and healthcare acquired pneumonia. This admission found to have healthcare associated pna). MBS 09/2016 reveaked suspected primary esophageal deficits, pharyngeal residue, trace penetration thin to cords silent while consuming barium pill. Dys 3, thin recommended. New s/s aspiraiton exhibited 11/14 with recommendation for full objective assessment.        SLP Plan  Continue with current plan of care       Recommendations  Diet recommendations: Dysphagia 1 (puree);Pudding-thick liquid Medication Administration: Whole meds with puree Supervision: Patient able to self  feed;Full supervision/cueing for compensatory strategies Compensations: Minimize environmental distractions;Small sips/bites;Slow rate;Clear throat intermittently;Multiple dry swallows after each bite/sip Postural Changes and/or Swallow Maneuvers: Seated upright 90 degrees;Upright 30-60 min after meal                Oral Care Recommendations: Oral care BID Follow up Recommendations: 24 hour supervision/assistance SLP Visit Diagnosis: Dysphagia, oropharyngeal phase (R13.12) Plan: Continue with current plan of care                      Rebecca [name] 04/29/2017, 9:43 AM   Rebecca [name].Ed [employer] [phone]

## 2017-04-29 NOTE — Progress Notes (Signed)
Pt pulled cortark out. Ng tube 12Fr. Placed in left nare, portable kub ordered. Will restart tube feeds and give meds when kub results.

## 2017-04-29 NOTE — Progress Notes (Signed)
Cortrak Tube Team Note:  Consult received to place a Cortrak feeding tube.   A 10 F Cortrak tube was placed in the L nare and secured with a nasal bridle at 75 cm. Per the Cortrak monitor reading the tube tip is located at the pylorus.   No x-ray is required. RN may begin using tube.   If the tube becomes dislodged please keep the tube and contact the Cortrak team at [email or web address] (password [email or web address]) for replacement.  If after hours and replacement cannot be delayed, place a NG tube and confirm placement with an abdominal x-ray.   Burtis Junes RD, LDN, CNSC Clinical Nutrition Pager: [phone] 04/29/2017 11:16 AM

## 2017-04-29 NOTE — Progress Notes (Signed)
Pharmacy Antibiotic Note  Rebecca [name] is a 76 y.o. female admitted on 04/24/2017 with pneumonia, enterobacter UTI.  Pharmacy has been consulted for meropenem dosing - day #4. WBC down 17.9 (chronic steroids), Afebrile. SCr trend down to 1.85, CrCl~20-25.  Plan: Adjust meropenem to 1g IV q12h for improving renal function Monitor clinical progress, c/s, renal function F/u de-escalation plan/LOT  Height: 5\' 3"  (160 cm) Weight: 132 lb (59.9 kg) IBW/kg (Calculated) : 52.4  Temp (24hrs), Avg:98.7 F (37.1 C), Min:98.3 F (36.8 C), Max:98.9 F (37.2 C)  Recent Labs  Lab 04/24/17 1521 04/24/17 1849 04/25/17 0306 04/26/17 0216 04/27/17 0703 04/28/17 0420 04/29/17 0220  WBC  --   --  23.9* 19.3* 25.7* 18.3* 17.9*  CREATININE  --   --  2.71* 2.99* 2.60* 2.17* 1.85*  LATICACIDVEN 1.93* 2.19*  --   --   --   --   --     Estimated Creatinine Clearance: 20.7 mL/min (A) (by C-G formula based on SCr of 1.85 mg/dL (H)).    Allergies  Allergen Reactions  . Levaquin [Levofloxacin] Other (See Comments)    Ruptured tendon  . Tape Other (See Comments)    PATIENT'S SKIN IS VERY, VERY THIN AND WILL TEAR AND BRUISE EASILY; PLEASE USE AN ALTERNATIVE (SOMETHING OTHER THAN TAPE)    Elicia Lamp, PharmD, BCPS Clinical Pharmacist Rx Phone # for today: [phone] After 3:30PM, please call Main Rx: [phone] 04/29/2017 12:10 PM

## 2017-04-29 NOTE — Progress Notes (Signed)
PROGRESS NOTE   Rebecca [name]  OEU:[id number]    DOB: 1940-09-15    DOA: 04/24/2017  PCP: [name], MD   I have briefly reviewed patients previous medical records in [facility].  Brief Narrative:  76 year old female with PMH of COPD, not on home oxygen, PMR on chronic prednisone 10 MG daily, CAD status post DES to RCA 2006, stage III CKD, chronic diastolic CHF, MGUS, hypothyroid, recent hospitalization for right trochanteric bursitis, acute on chronic diastolic CHF and HCAP, discharged to SNF and subsequently home, treated for presumed UTI with Ceftin at discharge from SNF, presented with lethargy, fever, cough productive of yellow sputum and vomiting. Admitted for LLL pneumonia suspicious for aspiration pneumonia. Failed swallow evaluation on 11/14. Concern for esophageal dysmotility, less likely obstruction. [facility] consulted and workup completed. diet per speech therapy ordered but very poor oral intake and at risk for aspiration. Therefore placed cor track for temporary tube feeding on 11/16. Speech therapy to reassess swallowing early next week.   Assessment & Plan:   Principal Problem:   Aspiration pneumonia ([place]) Active Problems:   Hypothyroidism   Schizoaffective disorder ([place])   GASTROESOPHAGEAL REFLUX, NO ESOPHAGITIS   Polymyalgia rheumatica (HCC)   Essential hypertension   Lobar pneumonia, unspecified organism (HCC)   TIA (transient ischemic attack)   Recurrent UTI   Dysphagia   Sepsis secondary to lobar/aspiration pneumonia - Recently hospitalized 10/1-10/13 for HCAP, treated with vancomycin/Zosyn and discharged on oral Augmentin. - Given history of GERD, dysphagia, on modified diet at home, input by speech therapy (failed swallow evaluation), strongly suspect aspiration pneumonia. - Continue empirically started IV vancomycin and meropenem. - Pulmonary toilet. - Recommend follow-up chest x-ray in 4 weeks to ensure resolution of pneumonia findings. - Blood  cultures 2: Negative to date. Sputum culture shows Candida. RSV PCR negative. - Clinically improving. Cultures negative thus far. MRSA PCR negative. DC vancomycin. - continue IV Zosyn. Discussed in detail with patient's daughter and advised her that patient remains at high risk for recurrent aspiration including with the NG tube feeds. Given her generalized failure to thrive, advanced age and frail health, recommended reconsidering changing her CODE STATUS and requested palliative care consult for GOC(daughter apparently was supposed to meet with palliative care team as outpatient).  Acute respiratory failure with hypoxia - Secondary to pneumonia and underlying COPD. Resolved. Continue to monitor closely.  Dysphagia/vomiting - Speech therapy evaluated on 11/14 by MBS and indicated that she was a high risk for aspiration and noted food products retained in lower esophagus and recommended nothing by mouth. - Consulted [facility]. Family did not want to pursue any aggressive interventions i.e. EGD. As per GI, recent barium swallow did not show obstruction and hence low likelihood of that. In any event formal barium swallow was done on 11/15 which showed no obstruction. - modified diet was started on 11/15 but patient did not take except a few spoons. I discussed with speech therapy this morning advised that patient remains high risk for aspiration and they recommended core track tube feeding for a couple days to improve nutritional status and possibly swallowing mechanism and reassess early next week. - patient and daughter agreeable to tube feeding. Risks and benefits were discussed in detail.  Anemia of chronic disease/chronic kidney disease Fluctuating but overall stable.   Leukocytosis - Secondary to IV steroids and sepsis. Management as above. Slowly improving.  Acute on Stage III chronic kidney disease - Patient had creatinine of 1.19 on 10/13. Presented with  creatinine of 2.2 which peaked to  2.9. Likely related to volume depletion. Continue gentle IV fluids and follow BMP in a.m. Creatinine improved to 1.85.  Non-anion gap metabolic acidosis - No diarrhea reported. Resolved after brief IV bicarbonate drip-DC'd bicarbonate.  CAD - Plavix  TIA - Antiplatelet discussion as above.   COPD - Stable without clinical bronchospasm.   PMR/chronic prednisone - Currently on IV Decadron 10 MG daily. Follows with Dr. Amil Amen, Rheumatology. Recently started on colchicine.  MGUS - Follows with outpatient hematology/oncology.   Essential hypertension - Mildly uncontrolled. Briefly on IV Cardizem drip and Metoprolol while NPO. Now changed to home PO meds via tube.  Chronic diastolic CHF - Compensated.  Hypothyroid - ILevothyroxine.   Hyponatremia - Resolved.   GERD - Change PPI to IV.   Schizoaffective disorder - Patient follows with Dr. Casimiro Needle, Psychiatry. On long-term Clozaril. Stable without change in MS per family.  Interstitial cystitis - Previously cultures positive for Pseudomonas.  - ID evaluated patient on previous admission and determined her to have colonization by Pseudomonas. No antibiotic treatment unless clearly symptomatic. She was on Ceftin as outpatient due to dysuria. Currently on meropenem.  - Urine culture this admission shows Enterobacter aeruginosa sensitive to imipenem.   Chronic hip pain secondary to L5-S1 spondylosis, right trochanteric bursitis  - Seen by Dr. Ninfa Linden, orthopedics on previous admission and underwent intra-articular steroid injection 03/24/17   Adult failure to thrive  - Multifactorial due to advanced age, frail and multiple comorbidities.  - Long d/w daughter and agreeable to Palliative consult for GOC> called.  Constipation - Bowel regimen. Did not respond with Dulcolax suppository on 2 days. Willing to try enema> ordered.  Prediabetes - Hemoglobin A1c in August was 6. Now complicated by steroids. SSI. Mildly  uncontrolled.   DVT prophylaxis: Heparin Code Status: Full Family Communication: Discussed in detail with patient's daughter at bedside. Updated care and answered questions.  Disposition: To be determined. May need SNF at discharge.   Consultants:  [facility]  Procedures:  None   Antimicrobials:  IV cefepime 1 dose  IV Meropenum IV Vancomycin   Subjective: Diet ordered yesterday> not eating much. Mild cough. No SOB. No BM.  ROS: As per daughter, issues with reflux over the weekend prior to admission followed by nausea and vomiting.  Objective:  Vitals:   04/29/17 0455 04/29/17 0708 04/29/17 0800 04/29/17 0810  BP: (!) 128/93 (!) 162/75 (!) 160/78 (!) 160/78  Pulse: (!) 103 94 94 93  Resp: (!) 21 (!) 22 20 16   Temp: 98.6 F (37 C) 98.3 F (36.8 C)    TempSrc: Oral Oral    SpO2: 95% 97% 98% 98%  Weight:      Height:        Examination:  General exam: Elderly female, small built and nourished, lying comfortably propped up in bed. Not in distress. Respiratory system: Improved breath sounds bilaterally. Slightly harsh posteriorly and occasional basal crackles. No rhonchi or wheezing. No increased work of breathing. Stable without change. Cardiovascular system: S1 & S2 heard, RRR. No JVD, murmurs, rubs, gallops or clicks. No pedal edema. Telemetry: Personally reviewed. Sinus rhythm in the 90s. Occ ST in 100's. Gastrointestinal system: Abdomen is nondistended, soft and nontender. No organomegaly or masses felt. Normal bowel sounds heard. Stable without change. Central nervous system: Alert and oriented to person and place. No focal neurological deficits. Extremities: Moving all limbs symmetrically.  Skin: No rashes, lesions or ulcers Psychiatry: Judgement and insight impaired but seem  to be improved compared to yesterday. Mood & affect flat.     Data Reviewed: I have personally reviewed following labs and imaging studies  CBC: Recent Labs  Lab 04/24/17 1501  04/25/17 0306 04/26/17 0216 04/27/17 0703 04/28/17 0420 04/29/17 0220  WBC 26.6* 23.9* 19.3* 25.7* 18.3* 17.9*  NEUTROABS 24.2*  --   --   --   --   --   HGB 11.1* 10.7* 7.5* 9.4* 8.4* 8.5*  HCT 32.3* 31.1* 21.0* 27.2* 24.3* 25.4*  MCV 88.5 88.6 89.7 88.0 88.4 90.1  PLT 239 187 169 179 192 202   Basic Metabolic Panel: Recent Labs  Lab 04/25/17 0306 04/26/17 0216 04/27/17 0703 04/28/17 0420 04/29/17 0220  NA 127* 131* 136 140 144  K 4.6 3.9 4.0 3.9 4.1  CL 93* 105 108 111 114*  CO2 22 17* 16* 21* 21*  GLUCOSE 174* 161* 179* 167* 117*  BUN 84* 89* 79* 68* 60*  CREATININE 2.71* 2.99* 2.60* 2.17* 1.85*  CALCIUM 9.1 7.6* 8.4* 8.3* 8.3*   Liver Function Tests: Recent Labs  Lab 04/24/17 1501  AST 18  ALT 17  ALKPHOS 40  BILITOT 0.7  PROT 5.8*  ALBUMIN 3.3*   HbA1C: No results for input(s): HGBA1C in the last 72 hours. CBG: Recent Labs  Lab 04/28/17 1625 04/28/17 1950 04/28/17 2308 04/29/17 0456 04/29/17 0827  GLUCAP 167* 162* 144* 101* 101*    Recent Results (from the past 240 hour(s))  Blood Culture (routine x 2)     Status: None (Preliminary result)   Collection Time: 04/24/17  2:40 PM  Result Value Ref Range Status   Specimen Description BLOOD SITE NOT SPECIFIED  Final   Special Requests   Final    BOTTLES DRAWN AEROBIC AND ANAEROBIC Blood Culture results may not be optimal due to an inadequate volume of blood received in culture bottles   Culture NO GROWTH 4 DAYS  Final   Report Status PENDING  Incomplete  Blood Culture (routine x 2)     Status: None (Preliminary result)   Collection Time: 04/24/17  2:57 PM  Result Value Ref Range Status   Specimen Description BLOOD RIGHT ANTECUBITAL  Final   Special Requests   Final    BOTTLES DRAWN AEROBIC AND ANAEROBIC Blood Culture results may not be optimal due to an excessive volume of blood received in culture bottles   Culture NO GROWTH 4 DAYS  Final   Report Status PENDING  Incomplete  Urine culture      Status: Abnormal   Collection Time: 04/24/17  3:01 PM  Result Value Ref Range Status   Specimen Description URINE, CATHETERIZED  Final   Special Requests NONE  Final   Culture >=100,000 COLONIES/mL ENTEROBACTER AEROGENES (A)  Final   Report Status 04/26/2017 FINAL  Final   Organism ID, Bacteria ENTEROBACTER AEROGENES (A)  Final      Susceptibility   Enterobacter aerogenes - MIC*    CEFAZOLIN >=64 RESISTANT Resistant     CEFTRIAXONE >=64 RESISTANT Resistant     CIPROFLOXACIN <=0.25 SENSITIVE Sensitive     GENTAMICIN <=1 SENSITIVE Sensitive     IMIPENEM 1 SENSITIVE Sensitive     NITROFURANTOIN 32 SENSITIVE Sensitive     TRIMETH/SULFA <=20 SENSITIVE Sensitive     PIP/TAZO >=128 RESISTANT Resistant     * >=100,000 COLONIES/mL ENTEROBACTER AEROGENES  Respiratory Panel by PCR     Status: None   Collection Time: 04/25/17 12:23 AM  Result Value Ref Range Status  Adenovirus NOT DETECTED NOT DETECTED Final   Coronavirus 229E NOT DETECTED NOT DETECTED Final   Coronavirus HKU1 NOT DETECTED NOT DETECTED Final   Coronavirus NL63 NOT DETECTED NOT DETECTED Final   Coronavirus OC43 NOT DETECTED NOT DETECTED Final   Metapneumovirus NOT DETECTED NOT DETECTED Final   Rhinovirus / Enterovirus NOT DETECTED NOT DETECTED Final   Influenza A NOT DETECTED NOT DETECTED Final   Influenza B NOT DETECTED NOT DETECTED Final   Parainfluenza Virus 1 NOT DETECTED NOT DETECTED Final   Parainfluenza Virus 2 NOT DETECTED NOT DETECTED Final   Parainfluenza Virus 3 NOT DETECTED NOT DETECTED Final   Parainfluenza Virus 4 NOT DETECTED NOT DETECTED Final   Respiratory Syncytial Virus NOT DETECTED NOT DETECTED Final   Bordetella pertussis NOT DETECTED NOT DETECTED Final   Chlamydophila pneumoniae NOT DETECTED NOT DETECTED Final   Mycoplasma pneumoniae NOT DETECTED NOT DETECTED Final  MRSA PCR Screening     Status: None   Collection Time: 04/25/17 12:23 AM  Result Value Ref Range Status   MRSA by PCR NEGATIVE  NEGATIVE Final    Comment:        The GeneXpert MRSA Assay (FDA approved for NASAL specimens only), is one component of a comprehensive MRSA colonization surveillance program. It is not intended to diagnose MRSA infection nor to guide or monitor treatment for MRSA infections.   Culture, sputum-assessment     Status: None   Collection Time: 04/25/17  6:50 AM  Result Value Ref Range Status   Specimen Description SPUTUM  Final   Special Requests NONE  Final   Sputum evaluation THIS SPECIMEN IS ACCEPTABLE FOR SPUTUM CULTURE  Final   Report Status 04/25/2017 FINAL  Final  Culture, respiratory (NON-Expectorated)     Status: None   Collection Time: 04/25/17  6:50 AM  Result Value Ref Range Status   Specimen Description SPUTUM  Final   Special Requests NONE Reflexed from [id number]  Final   Gram Stain   Final    ABUNDANT WBC PRESENT, PREDOMINANTLY PMN FEW SQUAMOUS EPITHELIAL CELLS PRESENT FEW GRAM POSITIVE COCCI IN PAIRS IN CHAINS RARE GRAM NEGATIVE RODS RARE BUDDING YEAST SEEN    Culture ABUNDANT CANDIDA ALBICANS  Final   Report Status 04/27/2017 FINAL  Final         Radiology Studies: Dg Esophagus  Result Date: 04/28/2017 CLINICAL DATA:  Difficulty swallowing. EXAM: ESOPHOGRAM/BARIUM SWALLOW TECHNIQUE: Single contrast examination was performed using  water-soluble. FLUOROSCOPY TIME:  Fluoroscopy Time:  1 minutes 12 seconds Radiation Exposure Index (if provided by the fluoroscopic device): 9.7 mGy COMPARISON:  None. FINDINGS: The entire examination was performed in the semi upright position. There was normal pharyngeal anatomy and motility. Contrast flowed freely through the esophagus without evidence of stricture or mass. There was normal esophageal mucosa without evidence of irregularity or ulceration. Esophageal motility was normal. No evidence of reflux. No definite hiatal hernia was demonstrated. IMPRESSION: Normal single contrast barium swallow.  No esophageal stricture.  Electronically Signed   By: Kathreen Devoid   On: 04/28/2017 10:18   Dg Swallowing Func-speech Pathology  Result Date: 04/27/2017 Objective Swallowing Evaluation: Type of Study: MBS-Modified Barium Swallow Study  Patient Details Name: Rebecca [name] MRN: [id number] Date of Birth: Jun 28, 1940 Today's Date: 04/27/2017 Time: SLP Start Time (ACUTE ONLY): 1205 -SLP Stop Time (ACUTE ONLY): 1226 SLP Time Calculation (min) (ACUTE ONLY): 21 min Past Medical History: Past Medical History: Diagnosis Date . AKI (acute kidney injury) ([place])  . Anemia in chronic  renal disease  . Aortic atherosclerosis ([place]) 01/27/2017 . CAD (coronary artery disease) 2006  a. s/p DES to RCA in 2006 b. low-risk NST in 2014 c. cath in 08/2015 showing patent RCA stent with nonobstructive disease . CKD (chronic kidney disease) stage 4, GFR 15-29 ml/min (HCC)  . Complication of anesthesia  . COPD (chronic obstructive pulmonary disease) ([place])  . Diabetes mellitus  . Diastolic heart failure, NYHA class 1 ([place])  . Dyslipidemia  . Dysuria 01/06/2015 . Frozen shoulder   right . Gastroesophageal reflux  . HAP (hospital-acquired pneumonia)  . Headache  . Hyperkalemia 01/01/2016 . Hypothyroidism  . MGUS (monoclonal gammopathy of unknown significance) 01/28/2014 . Obesity  . Pancreatic mass 01/27/2017 . Pneumonia 06/2016 . Polymyalgia rheumatica ([place])  . PONV (postoperative nausea and vomiting)  . Renal insufficiency  . Respiratory distress  . Schizoaffective disorder ([place])  . Sepsis due to pneumonia ([place])  . Shortness of breath  . Stroke [facility])  Past Surgical History: Past Surgical History: Procedure Laterality Date . ABDOMINAL HYSTERECTOMY   . CATARACT EXTRACTION   . CHOLECYSTECTOMY   . CORONARY ANGIOPLASTY WITH STENT PLACEMENT  2006  [place] DES to RCA, 50 % residual . PARTIAL HYSTERECTOMY  1995 HPI: [place] A Coxis a 76 y.o.femalewith medical history significant ofCOPD, pna,PMR, CAD, CVA, pna, GERD, pancreatic mass, barium esophagram revealing mild esophageal  dysmotility, puree diet at baseline (thin liquids), schizoaffective disorder, CKD stage IV, diastolic heart failure, MGUS, hypothyroidism who was most recently admitted at [facility] secondary to right-sided trochanteric bursitis, acute on chronic diastolic heart failure and healthcare acquired pneumonia. This admission found to have healthcare associated pna). MBS 09/2016 reveaked suspected primary esophageal deficits, pharyngeal residue, trace penetration thin to cords silent while consuming barium pill. Dys 3, thin recommended. New s/s aspiraiton exhibited 11/14 with recommendation for full objective assessment.   Subjective: pt awake in bed Assessment / Plan / Recommendation CHL IP CLINICAL IMPRESSIONS 04/27/2017 Clinical Impression Pt exhibits a suspected acute on chronic multifactoral oropharyngeal dysphagia evidenced by impaired motor and sensory function, history of known esophageal dysphagia and possible cervical fusion/osteophytes further impacted by current pna and deconditioning. Oral deficits are mild-mod with decreased oral control and transit. Silent and sensed aspiration and penetration before and during the swallow with thin, nectar and honey (cup & teaspoon volumes) due to decreased timing of initiation of protective mechanisms in addition to wide pharyngeal space, weak elevation of larynx and poor epiglottic inversion. Chin tuck helped to narrow pharyngeal space promoting increased laryngeal closure however airway intrusion continued. Moderate vallecular residue of puree texture not fully cleared. Esophagus scanned revealing residue in distal to mid esophagus. Puree consumed without entering airway however due to deconditioned state would not recommend po continue po's at present and recommend alternate means of nutrition with continued ST. Timeline/prognosis for improvement unclear however hopeful for return to food/liquid when safe.  SLP Visit Diagnosis Dysphagia, oropharyngeal phase  (R13.12) Attention and concentration deficit following -- Frontal lobe and executive function deficit following -- Impact on safety and function (No Data)   CHL IP TREATMENT RECOMMENDATION 04/27/2017 Treatment Recommendations Therapy as outlined in treatment plan below   Prognosis 04/27/2017 Prognosis for Safe Diet Advancement (No Data) Barriers to Reach Goals Severity of deficits Barriers/Prognosis Comment -- CHL IP DIET RECOMMENDATION 04/27/2017 SLP Diet Recommendations NPO Liquid Administration via -- Medication Administration Via alternative means Compensations -- Postural Changes --   CHL IP OTHER RECOMMENDATIONS 04/27/2017 Recommended Consults -- Oral Care Recommendations Oral care QID Other  Recommendations --   CHL IP FOLLOW UP RECOMMENDATIONS 04/27/2017 Follow up Recommendations 24 hour supervision/assistance   CHL IP FREQUENCY AND DURATION 04/27/2017 Speech Therapy Frequency (ACUTE ONLY) min 2x/week Treatment Duration 2 weeks      CHL IP ORAL PHASE 04/27/2017 Oral Phase Impaired Oral - Pudding Teaspoon -- Oral - Pudding Cup -- Oral - Honey Teaspoon Delayed oral transit Oral - Honey Cup Delayed oral transit;Decreased bolus cohesion Oral - Nectar Teaspoon -- Oral - Nectar Cup Delayed oral transit;Decreased bolus cohesion Oral - Nectar Straw -- Oral - Thin Teaspoon -- Oral - Thin Cup Delayed oral transit;Decreased bolus cohesion Oral - Thin Straw -- Oral - Puree Delayed oral transit Oral - Mech Soft -- Oral - Regular -- Oral - Multi-Consistency -- Oral - Pill -- Oral Phase - Comment --  CHL IP PHARYNGEAL PHASE 04/27/2017 Pharyngeal Phase Impaired Pharyngeal- Pudding Teaspoon -- Pharyngeal -- Pharyngeal- Pudding Cup -- Pharyngeal -- Pharyngeal- Honey Teaspoon Penetration/Aspiration during swallow;Reduced airway/laryngeal closure;Reduced laryngeal elevation;Reduced anterior laryngeal mobility;Reduced epiglottic inversion;Reduced pharyngeal peristalsis Pharyngeal Material enters airway, remains ABOVE vocal cords  and not ejected out Pharyngeal- Honey Cup Penetration/Aspiration during swallow;Reduced airway/laryngeal closure;Reduced laryngeal elevation;Reduced anterior laryngeal mobility;Reduced epiglottic inversion Pharyngeal Material enters airway, remains ABOVE vocal cords then ejected out;Material enters airway, remains ABOVE vocal cords and not ejected out Pharyngeal- Nectar Teaspoon -- Pharyngeal -- Pharyngeal- Nectar Cup Penetration/Aspiration during swallow;Reduced laryngeal elevation;Reduced airway/laryngeal closure;Reduced anterior laryngeal mobility;Reduced epiglottic inversion;Reduced pharyngeal peristalsis;Reduced tongue base retraction Pharyngeal Material enters airway, passes BELOW cords without attempt by patient to eject out (silent aspiration) Pharyngeal- Nectar Straw -- Pharyngeal -- Pharyngeal- Thin Teaspoon -- Pharyngeal -- Pharyngeal- Thin Cup Penetration/Aspiration before swallow;Reduced pharyngeal peristalsis;Reduced epiglottic inversion;Reduced laryngeal elevation;Reduced anterior laryngeal mobility;Reduced airway/laryngeal closure;Reduced tongue base retraction Pharyngeal Material enters airway, passes BELOW cords and not ejected out despite cough attempt by patient Pharyngeal- Thin Straw -- Pharyngeal -- Pharyngeal- Puree Pharyngeal residue - valleculae;Reduced epiglottic inversion Pharyngeal -- Pharyngeal- Mechanical Soft -- Pharyngeal -- Pharyngeal- Regular -- Pharyngeal -- Pharyngeal- Multi-consistency -- Pharyngeal -- Pharyngeal- Pill -- Pharyngeal -- Pharyngeal Comment --  CHL IP CERVICAL ESOPHAGEAL PHASE 04/27/2017 Cervical Esophageal Phase WFL Pudding Teaspoon -- Pudding Cup -- Honey Teaspoon -- Honey Cup -- Nectar Teaspoon -- Nectar Cup -- Nectar Straw -- Thin Teaspoon -- Thin Cup -- Thin Straw -- Puree -- Mechanical Soft -- Regular -- Multi-consistency -- Pill -- Cervical Esophageal Comment -- CHL IP GO 10/17/2013 Functional Assessment Tool Used Mini-mental State Examination, ASHA NOMS,  clinical judgment Functional Limitations Memory Swallow Current Status [phone]) (None) Swallow Goal Status (J6283) (None) Swallow Discharge Status (T5176) (None) Motor Speech Current Status (H6073) (None) Motor Speech Goal Status (X1062) (None) Motor Speech Goal Status (I9485) (None) Spoken Language Comprehension Current Status (I6270) (None) Spoken Language Comprehension Goal Status (J5009) (None) Spoken Language Comprehension Discharge Status [phone]) (None) Spoken Language Expression Current Status [phone]) (None) Spoken Language Expression Goal Status (I9678) (None) Spoken Language Expression Discharge Status [phone]) (None) Attention Current Status (F7510) (None) Attention Goal Status (C5852) (None) Attention Discharge Status (D7824) (None) Memory Current Status (M3536) CI Memory Goal Status (R4431) CI Memory Discharge Status (G9170) CI Voice Current Status (V4008) (None) Voice Goal Status (Q7619) (None) Voice Discharge Status (J0932) (None) Other Speech-Language Pathology Functional Limitation Current Status (I7124) (None) Other Speech-Language Pathology Functional Limitation Goal Status (P8099) (None) Other Speech-Language Pathology Functional Limitation Discharge Status [phone]) (None) [name] 04/27/2017, 1:49 PM Orbie Pyo Colvin Caroli.[employer] [phone]  Scheduled Meds: . clopidogrel  75 mg Oral QHS  . cloZAPine  300 mg Oral QHS  . colchicine  0.3 mg Oral QHS  . dexamethasone  10 mg Intravenous Q24H  . diltiazem  240 mg Oral Q breakfast  . guaiFENesin  400 mg Oral TID  . heparin  5,000 Units Subcutaneous Q8H  . ipratropium-albuterol  3 mL Inhalation TID  . isosorbide mononitrate  60 mg Oral Daily  . levothyroxine  75 mcg Oral QAC breakfast  . metoprolol succinate  100 mg Oral Q breakfast  . milk and molasses  1 enema Rectal Once  . mometasone-formoterol  2 puff Inhalation BID  . pantoprazole (PROTONIX) IV  40 mg Intravenous Q24H  . polyethylene glycol  17  g Oral BID  . pravastatin  20 mg Oral QHS  . senna-docusate  2 tablet Oral BID  . tamsulosin  0.4 mg Oral Q breakfast   Continuous Infusions: . sodium chloride 50 mL/hr at 04/29/17 0817  . meropenem (MERREM) IV Stopped (04/28/17 1841)     LOS: 5 days     Vernell Leep, MD, FACP, [facility]. [facility] Pager [phone]  If 7PM-7AM, please contact night-coverage [email or web address] Password [email or web address] 04/29/2017, 11:30 AM

## 2017-04-29 NOTE — Progress Notes (Signed)
Initial Nutrition Assessment  DOCUMENTATION CODES:   Not applicable  INTERVENTION:   -Initiate Nepro @ 15 ml/hr via cortrak tube and increase by 10 ml every 4 hours to goal rate of 35 ml/hr.   Tube feeding regimen provides 1512 kcal (100% of needs), 68 grams of protein, and 611 ml of H2O.   NUTRITION DIAGNOSIS:   Inadequate oral intake related to inability to eat, dysphagia as evidenced by NPO status.  GOAL:   Patient will meet greater than or equal to 90% of their needs  MONITOR:   Diet advancement, Labs, Weight trends, TF tolerance, I & O's  REASON FOR ASSESSMENT:   Consult Enteral/tube feeding initiation and management  ASSESSMENT:   Rebecca [name] is a 76 y.o. female with multiple medical problems including COPD, PMR currently on chronic prednisone therapy, remote history of coronary artery disease, chronic kidney disease, diastolic heart failure, MGUS admitted to the hospital with lethargy and fever. Patient was subsequently diagnosed with aspiration pneumonia  Pt admitted with sepsis secondary to HCAP.   11/15- s/p barium swallw; no esophageal stricture, s/p BSE- advanced to dysphagia 1 diet with no liquids 11/16- cortrak tube placed; tip of tube at pylorus  Spoke with pt and daughter at bedside. Daughter reports a decrease in appetite over the past 3 weeks (consuming mainly applesauce and soup). Daughter noted a dramatic decline in intake over the past weeks; pt eating extremely little (virtually no intake). Diet at home is a renal, DM diet; pt daughter is very vigilant with diet and uses [employer] website to monitor nutrient intake. Reviewed SLP note from today, which revealed with with wet vocal quality and multiple swallow with PO's; MBSS not recommended due to no significant improvements. Pt and daughter were amenable to cortrak placement for additional nutrition.   Pt daughter estimated pt has lost about 10# within the last year. Noted a 15.6% wt loss over the past  year. Wt has been stable over the past 3 months.  Discussed with pt and daughter how to will receive nutrition via TF. Discussed formula options- will provide renal, DM friendly formula per pt daughter request. Pt and daughter hopeful temporary TF will assist with pt progression and strength.   Mild depletion in upper and lower extremities revealed on exam, likely due to full assist status.   Labs reviewed: CBGS: 101-162.   NUTRITION - FOCUSED PHYSICAL EXAM:    Most Recent Value  Orbital Region  No depletion  Upper Arm Region  Mild depletion  Thoracic and Lumbar Region  No depletion  Buccal Region  No depletion  Temple Region  No depletion  Clavicle Bone Region  No depletion  Clavicle and Acromion Bone Region  No depletion  Scapular Bone Region  Mild depletion  Dorsal Hand  No depletion  Patellar Region  No depletion  Anterior Thigh Region  No depletion  Posterior Calf Region  Mild depletion  Edema (RD Assessment)  Mild  Hair  Reviewed  Eyes  Reviewed  Mouth  Reviewed  Skin  Reviewed  Nails  Reviewed       Diet Order:  NPO  EDUCATION NEEDS:   Education needs have been addressed  Skin:  Skin Assessment: Skin Integrity Issues: Skin Integrity Issues:: Stage I Stage I: sacrum  Last BM:  04/23/17  Height:   Ht Readings from Last 1 Encounters:  04/24/17 5\' 3"  (1.6 m)    Weight:   Wt Readings from Last 1 Encounters:  04/24/17 132 lb (59.9 kg)  Ideal Body Weight:  52.3 kg  BMI:  Body mass index is 23.38 kg/m.  Estimated Nutritional Needs:   Kcal:  1500-1700  Protein:  65-80 grams  Fluid:  1.5-1.7 L    Rafaella Kole A. Jimmye Norman, RD, LDN, CDE Pager: [phone] After hours Pager: [phone]

## 2017-04-30 LAB — CBC
HCT: 23.7 % — ABNORMAL LOW (ref 36.0–46.0)
HEMOGLOBIN: 7.9 g/dL — AB (ref 12.0–15.0)
MCH: 30.7 pg (ref 26.0–34.0)
MCHC: 33.3 g/dL (ref 30.0–36.0)
MCV: 92.2 fL (ref 78.0–100.0)
Platelets: 183 10*3/uL (ref 150–400)
RBC: 2.57 MIL/uL — AB (ref 3.87–5.11)
RDW: 14.5 % (ref 11.5–15.5)
WBC: 14.6 10*3/uL — AB (ref 4.0–10.5)

## 2017-04-30 LAB — GLUCOSE, CAPILLARY
GLUCOSE-CAPILLARY: 115 mg/dL — AB (ref 65–99)
GLUCOSE-CAPILLARY: 141 mg/dL — AB (ref 65–99)
Glucose-Capillary: 130 mg/dL — ABNORMAL HIGH (ref 65–99)
Glucose-Capillary: 153 mg/dL — ABNORMAL HIGH (ref 65–99)
Glucose-Capillary: 195 mg/dL — ABNORMAL HIGH (ref 65–99)
Glucose-Capillary: 208 mg/dL — ABNORMAL HIGH (ref 65–99)

## 2017-04-30 LAB — BASIC METABOLIC PANEL
ANION GAP: 7 (ref 5–15)
BUN: 56 mg/dL — AB (ref 6–20)
CHLORIDE: 115 mmol/L — AB (ref 101–111)
CO2: 22 mmol/L (ref 22–32)
Calcium: 8 mg/dL — ABNORMAL LOW (ref 8.9–10.3)
Creatinine, Ser: 1.75 mg/dL — ABNORMAL HIGH (ref 0.44–1.00)
GFR, EST AFRICAN AMERICAN: 31 mL/min — AB (ref >60)
GFR, EST NON AFRICAN AMERICAN: 27 mL/min — AB (ref >60)
Glucose, Bld: 155 mg/dL — ABNORMAL HIGH (ref 65–99)
POTASSIUM: 4 mmol/L (ref 3.5–5.1)
Sodium: 144 mmol/L (ref 135–145)

## 2017-04-30 MED ORDER — TRAMADOL HCL 50 MG PO TABS
50.0000 mg | ORAL_TABLET | Freq: Four times a day (QID) | ORAL | Status: DC | PRN
  Administered 2017-04-30 – 2017-05-05 (×11): 50 mg
  Filled 2017-04-30 (×11): qty 1

## 2017-04-30 NOTE — Progress Notes (Signed)
   04/30/17 1300  Clinical Encounter Type  Visited With Patient and family together  Visit Type Initial  Referral From Physician  Consult/Referral To Chaplain  Spiritual Encounters  Spiritual Needs Emotional;Prayer  Stress Factors  Family Stress Factors Health changes   Responding a SCC for prayer.  Patient was in the room with one of her daughters.  Daughter seemed very attentive to her.  Patient was resting, but assured me she was not asleep and asked that we pray together.  Daughter was able to share a little of what is going on but probably did not want to say too much in front of her mom.  I was paged to the ED, but will follow up later today. Chaplain Katherene Ponto

## 2017-04-30 NOTE — Progress Notes (Signed)
PROGRESS NOTE   Rebecca [name]  LFY:[id number]    DOB: 1941/05/10    DOA: 04/24/2017  PCP: [name], MD    Brief Narrative:  76 year old female with PMH of COPD, not on home oxygen, PMR on chronic prednisone 10 MG daily, CAD status post DES to RCA 2006, stage III CKD, chronic diastolic CHF, MGUS, hypothyroid, recent hospitalization for right trochanteric bursitis, acute on chronic diastolic CHF and HCAP, discharged to SNF and subsequently home, treated for presumed UTI with Ceftin at discharge from SNF, presented with lethargy, fever, cough productive of yellow sputum and vomiting. Admitted for LLL pneumonia suspicious for aspiration pneumonia. Failed swallow evaluation on 11/14. Concern for esophageal dysmotility, less likely obstruction. [facility] consulted and workup completed. diet per speech therapy ordered but very poor oral intake and at risk for aspiration. Therefore placed cor track for temporary tube feeding on 11/16. Speech therapy to reassess swallowing early next week.   Assessment & Plan:   Principal Problem:   Aspiration pneumonia ([place]) Active Problems:   Hypothyroidism   Schizoaffective disorder ([place])   GASTROESOPHAGEAL REFLUX, NO ESOPHAGITIS   Polymyalgia rheumatica ([facility])   Essential hypertension   Lobar pneumonia, unspecified organism ([facility])   TIA (transient ischemic attack)   Recurrent UTI   Dysphagia   Sepsis secondary to lobar/aspiration pneumonia - Recently hospitalized 10/1-10/13 for HCAP, treated with vancomycin/Zosyn and discharged on oral Augmentin. - Given history of GERD, dysphagia, on modified diet at home, input by speech therapy (failed swallow evaluation), strongly suspect aspiration pneumonia. - Continue empirically started IV vancomycin and meropenem. - Pulmonary toilet. - Recommend follow-up chest x-ray in 4 weeks to ensure resolution of pneumonia findings. - Blood cultures 2: Negative to date. Sputum culture shows Candida. RSV PCR negative. -  Clinically improving. Cultures negative thus far. MRSA PCR negative. DC vancomycin. - continue IV Zosyn.   Acute respiratory failure with hypoxia - Secondary to pneumonia and underlying COPD. Resolved. Continue to monitor closely.  Dysphagia/vomiting - Speech therapy evaluated on 11/14 by MBS and indicated that she was a high risk for aspiration and noted food products retained in lower esophagus and recommended nothing by mouth. - Consulted [facility]. Family did not want to pursue any aggressive interventions i.e. EGD. As per GI, recent barium swallow did not show obstruction and hence low likelihood of that. In any event formal barium swallow was done on 11/15 which showed no obstruction. - modified diet was started on 11/15 but patient did not take except a few spoons. I discussed with speech therapy this morning advised that patient remains high risk for aspiration and they recommended core track tube feeding for a couple days to improve nutritional status and possibly swallowing mechanism and reassess early next week.   Anemia of chronic disease/chronic kidney disease Fluctuating but overall stable.   Leukocytosis - Secondary to IV steroids and sepsis. Management as above. Slowly improving.  Acute on Stage III chronic kidney disease - Patient had creatinine of 1.19 on 10/13. Presented with creatinine of 2.2 which peaked to 2.9. Likely related to volume depletion. Continue gentle IV fluids and follow BMP in a.m. Creatinine improved to 1.85.  Non-anion gap metabolic acidosis - No diarrhea reported. Resolved after brief IV bicarbonate drip-DC'd bicarbonate.  CAD - Plavix  TIA - Antiplatelet discussion as above.   COPD - Stable without clinical bronchospasm.   PMR/chronic prednisone - Currently on IV Decadron 10 MG daily. Follows with Dr. Amil Amen, Rheumatology. Recently started on colchicine.  MGUS -  Follows with outpatient hematology/oncology.   Essential hypertension -  Mildly uncontrolled. Briefly on IV Cardizem drip and Metoprolol while NPO. Now changed to home PO meds via tube.  Chronic diastolic CHF - Compensated.  Hypothyroid - ILevothyroxine.   Hyponatremia - Resolved.   GERD - Change PPI to IV.   Schizoaffective disorder - Patient follows with Dr. Casimiro Needle, Psychiatry. On long-term Clozaril. Stable without change in MS per family.  Interstitial cystitis - Previously cultures positive for Pseudomonas.  - ID evaluated patient on previous admission and determined her to have colonization by Pseudomonas. No antibiotic treatment unless clearly symptomatic. She was on Ceftin as outpatient due to dysuria. Currently on meropenem.  - Urine culture this admission shows Enterobacter aeruginosa sensitive to imipenem.   Chronic hip pain secondary to L5-S1 spondylosis, right trochanteric bursitis  - Seen by Dr. Ninfa Linden, orthopedics on previous admission and underwent intra-articular steroid injection 03/24/17   Adult failure to thrive  - Multifactorial due to advanced age, frail and multiple comorbidities.  - Palliative consult for GOC    Prediabetes - Hemoglobin A1c in August was 6. Now complicated by steroids. SSI. Mildly uncontrolled.   DVT prophylaxis: Heparin Code Status: Full Family Communication: Discussed in detail with patient's daughter at bedside. Updated care and answered questions.  Disposition: To be determined. May need SNF at discharge.   Consultants:  [facility]  Procedures:  None   Antimicrobials:  IV cefepime 1 dose  IV Meropenum IV Vancomycin   Subjective: No fever or chills..  Objective:  Vitals:   04/29/17 2337 04/30/17 0400 04/30/17 0821 04/30/17 1153  BP: (!) 149/68 (!) 152/67  (!) 151/66  Pulse: 90 88  88  Resp: 19 (!) 21  (!) 22  Temp: (!) 97.5 F (36.4 C) 99.3 F (37.4 C)  100.1 F (37.8 C)  TempSrc: Axillary Axillary  Oral  SpO2: 97% 97% 97% 97%  Weight:      Height:         Examination:  General exam: Comfortable, no acute distress Respiratory system: Few occasional basal crackles   Cardiovascular system: S1 & S2 heard, RRR. No JVD, murmurs, rubs, gallops or clicks. No pedal edema.  Gastrointestinal system: Abdomen soft and nontender. No organomegaly , normoactive bowel sounds  Central nervous system: Alert and appropriate, no acute focal deficit extremities: Moving all limbs symmetrically.  Skin: No rashes, lesions or ulcers      Data Reviewed: I have personally reviewed following labs and imaging studies  CBC: Recent Labs  Lab 04/24/17 1501  04/26/17 0216 04/27/17 0703 04/28/17 0420 04/29/17 0220 04/30/17 0233  WBC 26.6*   < > 19.3* 25.7* 18.3* 17.9* 14.6*  NEUTROABS 24.2*  --   --   --   --   --   --   HGB 11.1*   < > 7.5* 9.4* 8.4* 8.5* 7.9*  HCT 32.3*   < > 21.0* 27.2* 24.3* 25.4* 23.7*  MCV 88.5   < > 89.7 88.0 88.4 90.1 92.2  PLT 239   < > 169 179 192 185 183   < > = values in this interval not displayed.   Basic Metabolic Panel: Recent Labs  Lab 04/26/17 0216 04/27/17 0703 04/28/17 0420 04/29/17 0220 04/30/17 0233  NA 131* 136 140 144 144  K 3.9 4.0 3.9 4.1 4.0  CL 105 108 111 114* 115*  CO2 17* 16* 21* 21* 22  GLUCOSE 161* 179* 167* 117* 155*  BUN 89* 79* 68* 60* 56*  CREATININE  2.99* 2.60* 2.17* 1.85* 1.75*  CALCIUM 7.6* 8.4* 8.3* 8.3* 8.0*   Liver Function Tests: Recent Labs  Lab 04/24/17 1501  AST 18  ALT 17  ALKPHOS 40  BILITOT 0.7  PROT 5.8*  ALBUMIN 3.3*   HbA1C: No results for input(s): HGBA1C in the last 72 hours. CBG: Recent Labs  Lab 04/29/17 1937 04/29/17 2336 04/30/17 0402 04/30/17 0754 04/30/17 1151  GLUCAP 155* 164* 130* 115* 141*    Recent Results (from the past 240 hour(s))  Blood Culture (routine x 2)     Status: None   Collection Time: 04/24/17  2:40 PM  Result Value Ref Range Status   Specimen Description BLOOD SITE NOT SPECIFIED  Final   Special Requests   Final    BOTTLES  DRAWN AEROBIC AND ANAEROBIC Blood Culture results may not be optimal due to an inadequate volume of blood received in culture bottles   Culture NO GROWTH 5 DAYS  Final   Report Status 04/29/2017 FINAL  Final  Blood Culture (routine x 2)     Status: None   Collection Time: 04/24/17  2:57 PM  Result Value Ref Range Status   Specimen Description BLOOD RIGHT ANTECUBITAL  Final   Special Requests   Final    BOTTLES DRAWN AEROBIC AND ANAEROBIC Blood Culture results may not be optimal due to an excessive volume of blood received in culture bottles   Culture NO GROWTH 5 DAYS  Final   Report Status 04/29/2017 FINAL  Final  Urine culture     Status: Abnormal   Collection Time: 04/24/17  3:01 PM  Result Value Ref Range Status   Specimen Description URINE, CATHETERIZED  Final   Special Requests NONE  Final   Culture >=100,000 COLONIES/mL ENTEROBACTER AEROGENES (A)  Final   Report Status 04/26/2017 FINAL  Final   Organism ID, Bacteria ENTEROBACTER AEROGENES (A)  Final      Susceptibility   Enterobacter aerogenes - MIC*    CEFAZOLIN >=64 RESISTANT Resistant     CEFTRIAXONE >=64 RESISTANT Resistant     CIPROFLOXACIN <=0.25 SENSITIVE Sensitive     GENTAMICIN <=1 SENSITIVE Sensitive     IMIPENEM 1 SENSITIVE Sensitive     NITROFURANTOIN 32 SENSITIVE Sensitive     TRIMETH/SULFA <=20 SENSITIVE Sensitive     PIP/TAZO >=128 RESISTANT Resistant     * >=100,000 COLONIES/mL ENTEROBACTER AEROGENES  Respiratory Panel by PCR     Status: None   Collection Time: 04/25/17 12:23 AM  Result Value Ref Range Status   Adenovirus NOT DETECTED NOT DETECTED Final   Coronavirus 229E NOT DETECTED NOT DETECTED Final   Coronavirus HKU1 NOT DETECTED NOT DETECTED Final   Coronavirus NL63 NOT DETECTED NOT DETECTED Final   Coronavirus OC43 NOT DETECTED NOT DETECTED Final   Metapneumovirus NOT DETECTED NOT DETECTED Final   Rhinovirus / Enterovirus NOT DETECTED NOT DETECTED Final   Influenza A NOT DETECTED NOT DETECTED Final    Influenza B NOT DETECTED NOT DETECTED Final   Parainfluenza Virus 1 NOT DETECTED NOT DETECTED Final   Parainfluenza Virus 2 NOT DETECTED NOT DETECTED Final   Parainfluenza Virus 3 NOT DETECTED NOT DETECTED Final   Parainfluenza Virus 4 NOT DETECTED NOT DETECTED Final   Respiratory Syncytial Virus NOT DETECTED NOT DETECTED Final   Bordetella pertussis NOT DETECTED NOT DETECTED Final   Chlamydophila pneumoniae NOT DETECTED NOT DETECTED Final   Mycoplasma pneumoniae NOT DETECTED NOT DETECTED Final  MRSA PCR Screening     Status: None  Collection Time: 04/25/17 12:23 AM  Result Value Ref Range Status   MRSA by PCR NEGATIVE NEGATIVE Final    Comment:        The GeneXpert MRSA Assay (FDA approved for NASAL specimens only), is one component of a comprehensive MRSA colonization surveillance program. It is not intended to diagnose MRSA infection nor to guide or monitor treatment for MRSA infections.   Culture, sputum-assessment     Status: None   Collection Time: 04/25/17  6:50 AM  Result Value Ref Range Status   Specimen Description SPUTUM  Final   Special Requests NONE  Final   Sputum evaluation THIS SPECIMEN IS ACCEPTABLE FOR SPUTUM CULTURE  Final   Report Status 04/25/2017 FINAL  Final  Culture, respiratory (NON-Expectorated)     Status: None   Collection Time: 04/25/17  6:50 AM  Result Value Ref Range Status   Specimen Description SPUTUM  Final   Special Requests NONE Reflexed from [id number]  Final   Gram Stain   Final    ABUNDANT WBC PRESENT, PREDOMINANTLY PMN FEW SQUAMOUS EPITHELIAL CELLS PRESENT FEW GRAM POSITIVE COCCI IN PAIRS IN CHAINS RARE GRAM NEGATIVE RODS RARE BUDDING YEAST SEEN    Culture ABUNDANT CANDIDA ALBICANS  Final   Report Status 04/27/2017 FINAL  Final         Radiology Studies: Dg Abd Portable 1v  Result Date: 04/29/2017 CLINICAL DATA:  76 year old female with enteric tube placement. EXAM: PORTABLE ABDOMEN - 1 VIEW COMPARISON:  Abdominal  radiograph dated 04/27/2017 FINDINGS: Partially visualized enteric tube with tip and side-port in the left upper abdomen likely within the stomach. The tube courses into the body of the stomach and turns round with tip likely in the gastric fundus. Large amount of stool noted throughout the colon. No bowel dilatation. Right upper quadrant cholecystectomy clips. Left lung base densities may represent atelectasis versus infiltrate. There is degenerative changes of the spine. IMPRESSION: 1. Partially visualized enteric tube with tip in the stomach. 2. Large amount of colonic stool burden.  No bowel dilatation. 3. Left lung base atelectasis versus infiltrate. Electronically Signed   By: Anner Crete M.D.   On: 04/29/2017 18:58        Scheduled Meds: . clopidogrel  75 mg Per Tube QHS  . clotrimazole   Topical BID  . cloZAPine  300 mg Per Tube QHS  . colchicine  0.3 mg Per Tube QHS  . dexamethasone  10 mg Intravenous Q24H  . diltiazem  60 mg Per Tube Q6H  . feeding supplement (NEPRO CARB STEADY)  1,000 mL Per Tube Q24H  . guaiFENesin  400 mg Per Tube TID  . heparin  5,000 Units Subcutaneous Q8H  . ipratropium-albuterol  3 mL Inhalation TID  . isosorbide mononitrate  60 mg Oral Daily  . levothyroxine  75 mcg Per Tube QAC breakfast  . metoprolol tartrate  50 mg Per Tube BID  . mometasone-formoterol  2 puff Inhalation BID  . pantoprazole (PROTONIX) IV  40 mg Intravenous Q24H  . polyethylene glycol  17 g Per Tube BID  . pravastatin  20 mg Per Tube QHS  . saccharomyces boulardii  250 mg Per Tube BID  . senna-docusate  2 tablet Per Tube BID  . tamsulosin  0.4 mg Oral Q breakfast   Continuous Infusions: . meropenem (MERREM) IV Stopped (04/30/17 0923)     LOS: 6 days     OSEI-BONSU,Jacyln Carmer, MD [facility] Pager [phone]   If 7PM-7AM, please contact night-coverage [email or web address]  Password [email or web address] 04/30/2017, 12:53 PM

## 2017-05-01 LAB — GLUCOSE, CAPILLARY
GLUCOSE-CAPILLARY: 117 mg/dL — AB (ref 65–99)
GLUCOSE-CAPILLARY: 188 mg/dL — AB (ref 65–99)
GLUCOSE-CAPILLARY: 205 mg/dL — AB (ref 65–99)

## 2017-05-01 NOTE — Progress Notes (Signed)
Report called to 5W Patient transferring to [phone]. All personal belongings sent with patient and family at bedside. Patient transported via bed. VSS stable and patient voiced no complaints.

## 2017-05-01 NOTE — Progress Notes (Signed)
PROGRESS NOTE    Rebecca [name]  QVZ:[id number] DOB: [birth year] DOA: 04/24/2017 PCP: [name], MD  Brief Narrative:76 year old female with PMH of COPD, not on home oxygen, PMR on chronic prednisone 10 MG daily, CAD status post DES to RCA 2006, stage III CKD, chronic diastolic CHF, MGUS, hypothyroid, recent hospitalization for right trochanteric bursitis, acute on chronic diastolic CHF and HCAP, discharged to SNF and subsequently home, treated for presumed UTI with Ceftin at discharge from SNF, presented with lethargy, fever, cough productive of yellow sputum and vomiting. Admitted for LLL pneumonia suspicious for aspiration pneumonia. Failed swallow evaluation on 11/14. Concern for esophageal dysmotility, less likely obstruction. [facility] consulted and workup completed. diet per speech therapy ordered but very poor oral intake and at risk for aspiration. Therefore placed cor track for temporary tube feeding on 11/16. Speech therapy following the patient.    Assessment & Plan:   Principal Problem:   Aspiration pneumonia ([place]) Active Problems:   Hypothyroidism   Schizoaffective disorder ([place])   GASTROESOPHAGEAL REFLUX, NO ESOPHAGITIS   Polymyalgia rheumatica ([facility])   Essential hypertension   Lobar pneumonia, unspecified organism ([facility])   TIA (transient ischemic attack)   Recurrent UTI   Dysphagia  Sepsis secondary to lobar/aspiration pneumonia/acute respiratory failure with hypoxia - Recently hospitalized 10/1-10/13 for HCAP, treated with vancomycin/Zosyn and discharged on oral Augmentin. - Given history of GERD, dysphagia, on modified diet at home, input by speech therapy (failed swallow evaluation), strongly suspect aspiration pneumonia. - Continue empirically started IV vancomycin and meropenem. - Pulmonary toilet. - Recommend follow-up chest x-ray in 4 weeks to ensure resolution of pneumonia findings. - Blood cultures 2: Negative to date. Sputum culture shows Candida. RSV PCR  negative. - Clinically improving. Cultures negative thus far. MRSA PCR negative. DC vancomycin. - continue IV Zosyn   Dysphagia/vomiting - Speech therapy evaluated on 11/14 by [facility] and indicated that she was a high risk for aspiration and noted food products retained in lower esophagus and recommended nothing by mouth. - Consulted [facility]. Family did not want to pursue any aggressive interventions i.e. EGD. As per GI, recent barium swallow did not show obstruction and hence low likelihood of that. In any event formal barium swallow was done on 11/15 which showed no obstruction. - modified diet was started on 11/15 but patient did not take except a few spoons. I discussed with speech therapy this morning advised that patient remains high risk for aspiration and they recommended core track tube feeding for a couple days to improve nutritional status and possibly swallowing mechanism and reassess early neXT week.  Leukocytosis improved on IV Zosyn.  Patient is also on steroids monitor.  Acute on chronic CKD stage III monitor renal functions daily.  PMR chronic steroids.  TIA continue Plavix CAD continue Plavix Hypertension on Imdur and diltiazem metoprolol History of schizo affective disorder patient on Clozaril chronic basis. Interstitial cystitis previous culture grew Pseudomonas per infectious disease no antibiotic treatment unless patient is clearly symptomatic.  Patient is currently on meropenem. Chronic hip pain secondary to L5-S1 spondylosis right trochanteric bursitis/pseudogout status post steroid injection.  Continue colchicine.  DVT prophylaxis heparin :Code Status: Full code Family Communication: Discussed with daughter Disposition Plan: TBD Consultants: GI  Procedures: None Antimicrobials:  meropenem  Subjective: Complains of right hip pain chronic  Objective: Resting in bed in no acute distress. Vitals:   05/01/17 0354 05/01/17 0400 05/01/17 0800 05/01/17 0808  BP: (!)  149/59 (!) 146/59 (!) 143/57   Pulse: 84  84 85   Resp: 20 18 20    Temp: 98.9 F (37.2 C)  99 F (37.2 C)   TempSrc: Oral  Axillary   SpO2: 98% 97% 97% 98%  Weight: 62.5 kg (137 lb 12.6 oz)     Height:        Intake/Output Summary (Last 24 hours) at 05/01/2017 1250 Last data filed at 05/01/2017 0600 Gross per 24 hour  Intake 860.17 ml  Output 1100 ml  Net -239.83 ml   Filed Weights   04/29/17 1200 04/30/17 2253 05/01/17 0354  Weight: 61.7 kg (136 lb 0.4 oz) 62.5 kg (137 lb 12.6 oz) 62.5 kg (137 lb 12.6 oz)    Examination:  General exam: Appears calm and comfortable  Respiratory system: Clear to auscultation. Respiratory effort normal. Cardiovascular system: S1 & S2 heard, RRR. No JVD, murmurs, rubs, gallops or clicks. No pedal edema. Gastrointestinal system: Abdomen is nondistended, soft and nontender. No organomegaly or masses felt. Normal bowel sounds heard. Central nervous system: Alert and oriented. No focal neurological deficits. Extremities: Symmetric 5 x 5 power. Skin: No rashes, lesions or ulcers Psychiatry: Judgement and insight appear normal. Mood & affect appropriate.     Data Reviewed: I have personally reviewed following labs and imaging studies  CBC: Recent Labs  Lab 04/24/17 1501  04/26/17 0216 04/27/17 0703 04/28/17 0420 04/29/17 0220 04/30/17 0233  WBC 26.6*   < > 19.3* 25.7* 18.3* 17.9* 14.6*  NEUTROABS 24.2*  --   --   --   --   --   --   HGB 11.1*   < > 7.5* 9.4* 8.4* 8.5* 7.9*  HCT 32.3*   < > 21.0* 27.2* 24.3* 25.4* 23.7*  MCV 88.5   < > 89.7 88.0 88.4 90.1 92.2  PLT 239   < > 169 179 192 185 183   < > = values in this interval not displayed.   Basic Metabolic Panel: Recent Labs  Lab 04/26/17 0216 04/27/17 0703 04/28/17 0420 04/29/17 0220 04/30/17 0233  NA 131* 136 140 144 144  K 3.9 4.0 3.9 4.1 4.0  CL 105 108 111 114* 115*  CO2 17* 16* 21* 21* 22  GLUCOSE 161* 179* 167* 117* 155*  BUN 89* 79* 68* 60* 56*  CREATININE 2.99*  2.60* 2.17* 1.85* 1.75*  CALCIUM 7.6* 8.4* 8.3* 8.3* 8.0*   GFR: Estimated Creatinine Clearance: 21.9 mL/min (A) (by C-G formula based on SCr of 1.75 mg/dL (H)). Liver Function Tests: Recent Labs  Lab 04/24/17 1501  AST 18  ALT 17  ALKPHOS 40  BILITOT 0.7  PROT 5.8*  ALBUMIN 3.3*   No results for input(s): LIPASE, AMYLASE in the last 168 hours. No results for input(s): AMMONIA in the last 168 hours. Coagulation Profile: No results for input(s): INR, PROTIME in the last 168 hours. Cardiac Enzymes: No results for input(s): CKTOTAL, [name], [name], TROPONINI in the last 168 hours. BNP (last 3 results) Recent Labs    03/29/17 1646  PROBNP 84.0   HbA1C: No results for input(s): HGBA1C in the last 72 hours. CBG: Recent Labs  Lab 04/30/17 1634 04/30/17 1930 05/01/17 0000 05/01/17 0354 05/01/17 1146  GLUCAP 208* 195* 153* 117* 188*   Lipid Profile: No results for input(s): CHOL, [name], [name], [name], [name], LDLDIRECT in the last 72 hours. Thyroid Function Tests: No results for input(s): TSH, T4TOTAL, FREET4, T3FREE, THYROIDAB in the last 72 hours. Anemia Panel: No results for input(s): VITAMINB12, FOLATE, FERRITIN, TIBC, IRON, RETICCTPCT in the last  72 hours. Sepsis Labs: Recent Labs  Lab 04/24/17 1521 04/24/17 1849 04/28/17 0420  PROCALCITON  --   --  2.27  LATICACIDVEN 1.93* 2.19*  --     Recent Results (from the past 240 hour(s))  Blood Culture (routine x 2)     Status: None   Collection Time: 04/24/17  2:40 PM  Result Value Ref Range Status   Specimen Description BLOOD SITE NOT SPECIFIED  Final   Special Requests   Final    BOTTLES DRAWN AEROBIC AND ANAEROBIC Blood Culture results may not be optimal due to an inadequate volume of blood received in culture bottles   Culture NO GROWTH 5 DAYS  Final   Report Status 04/29/2017 FINAL  Final  Blood Culture (routine x 2)     Status: None   Collection Time: 04/24/17  2:57 PM  Result Value Ref Range Status    Specimen Description BLOOD RIGHT ANTECUBITAL  Final   Special Requests   Final    BOTTLES DRAWN AEROBIC AND ANAEROBIC Blood Culture results may not be optimal due to an excessive volume of blood received in culture bottles   Culture NO GROWTH 5 DAYS  Final   Report Status 04/29/2017 FINAL  Final  Urine culture     Status: Abnormal   Collection Time: 04/24/17  3:01 PM  Result Value Ref Range Status   Specimen Description URINE, CATHETERIZED  Final   Special Requests NONE  Final   Culture >=100,000 COLONIES/mL ENTEROBACTER AEROGENES (A)  Final   Report Status 04/26/2017 FINAL  Final   Organism ID, Bacteria ENTEROBACTER AEROGENES (A)  Final      Susceptibility   Enterobacter aerogenes - MIC*    CEFAZOLIN >=64 RESISTANT Resistant     CEFTRIAXONE >=64 RESISTANT Resistant     CIPROFLOXACIN <=0.25 SENSITIVE Sensitive     GENTAMICIN <=1 SENSITIVE Sensitive     IMIPENEM 1 SENSITIVE Sensitive     NITROFURANTOIN 32 SENSITIVE Sensitive     TRIMETH/SULFA <=20 SENSITIVE Sensitive     PIP/TAZO >=128 RESISTANT Resistant     * >=100,000 COLONIES/mL ENTEROBACTER AEROGENES  Respiratory Panel by PCR     Status: None   Collection Time: 04/25/17 12:23 AM  Result Value Ref Range Status   Adenovirus NOT DETECTED NOT DETECTED Final   Coronavirus 229E NOT DETECTED NOT DETECTED Final   Coronavirus HKU1 NOT DETECTED NOT DETECTED Final   Coronavirus NL63 NOT DETECTED NOT DETECTED Final   Coronavirus OC43 NOT DETECTED NOT DETECTED Final   Metapneumovirus NOT DETECTED NOT DETECTED Final   Rhinovirus / Enterovirus NOT DETECTED NOT DETECTED Final   Influenza A NOT DETECTED NOT DETECTED Final   Influenza B NOT DETECTED NOT DETECTED Final   Parainfluenza Virus 1 NOT DETECTED NOT DETECTED Final   Parainfluenza Virus 2 NOT DETECTED NOT DETECTED Final   Parainfluenza Virus 3 NOT DETECTED NOT DETECTED Final   Parainfluenza Virus 4 NOT DETECTED NOT DETECTED Final   Respiratory Syncytial Virus NOT DETECTED NOT  DETECTED Final   Bordetella pertussis NOT DETECTED NOT DETECTED Final   Chlamydophila pneumoniae NOT DETECTED NOT DETECTED Final   Mycoplasma pneumoniae NOT DETECTED NOT DETECTED Final  MRSA PCR Screening     Status: None   Collection Time: 04/25/17 12:23 AM  Result Value Ref Range Status   MRSA by PCR NEGATIVE NEGATIVE Final    Comment:        The GeneXpert MRSA Assay (FDA approved for NASAL specimens only), is one component of  a comprehensive MRSA colonization surveillance program. It is not intended to diagnose MRSA infection nor to guide or monitor treatment for MRSA infections.   Culture, sputum-assessment     Status: None   Collection Time: 04/25/17  6:50 AM  Result Value Ref Range Status   Specimen Description SPUTUM  Final   Special Requests NONE  Final   Sputum evaluation THIS SPECIMEN IS ACCEPTABLE FOR SPUTUM CULTURE  Final   Report Status 04/25/2017 FINAL  Final  Culture, respiratory (NON-Expectorated)     Status: None   Collection Time: 04/25/17  6:50 AM  Result Value Ref Range Status   Specimen Description SPUTUM  Final   Special Requests NONE Reflexed from [id number]  Final   Gram Stain   Final    ABUNDANT WBC PRESENT, PREDOMINANTLY PMN FEW SQUAMOUS EPITHELIAL CELLS PRESENT FEW GRAM POSITIVE COCCI IN PAIRS IN CHAINS RARE GRAM NEGATIVE RODS RARE BUDDING YEAST SEEN    Culture ABUNDANT CANDIDA ALBICANS  Final   Report Status 04/27/2017 FINAL  Final         Radiology Studies: Dg Abd Portable 1v  Result Date: 04/29/2017 CLINICAL DATA:  76 year old female with enteric tube placement. EXAM: PORTABLE ABDOMEN - 1 VIEW COMPARISON:  Abdominal radiograph dated 04/27/2017 FINDINGS: Partially visualized enteric tube with tip and side-port in the left upper abdomen likely within the stomach. The tube courses into the body of the stomach and turns round with tip likely in the gastric fundus. Large amount of stool noted throughout the colon. No bowel dilatation. Right upper  quadrant cholecystectomy clips. Left lung base densities may represent atelectasis versus infiltrate. There is degenerative changes of the spine. IMPRESSION: 1. Partially visualized enteric tube with tip in the stomach. 2. Large amount of colonic stool burden.  No bowel dilatation. 3. Left lung base atelectasis versus infiltrate. Electronically Signed   By: Anner Crete M.D.   On: 04/29/2017 18:58        Scheduled Meds: . clopidogrel  75 mg Per Tube QHS  . clotrimazole   Topical BID  . cloZAPine  300 mg Per Tube QHS  . colchicine  0.3 mg Per Tube QHS  . dexamethasone  10 mg Intravenous Q24H  . diltiazem  60 mg Per Tube Q6H  . feeding supplement (NEPRO CARB STEADY)  1,000 mL Per Tube Q24H  . guaiFENesin  400 mg Per Tube TID  . heparin  5,000 Units Subcutaneous Q8H  . ipratropium-albuterol  3 mL Inhalation TID  . isosorbide mononitrate  60 mg Oral Daily  . levothyroxine  75 mcg Per Tube QAC breakfast  . metoprolol tartrate  50 mg Per Tube BID  . mometasone-formoterol  2 puff Inhalation BID  . pantoprazole (PROTONIX) IV  40 mg Intravenous Q24H  . polyethylene glycol  17 g Per Tube BID  . pravastatin  20 mg Per Tube QHS  . saccharomyces boulardii  250 mg Per Tube BID  . senna-docusate  2 tablet Per Tube BID  . tamsulosin  0.4 mg Oral Q breakfast   Continuous Infusions: . meropenem (MERREM) IV Stopped (05/01/17 0931)     LOS: 7 days    Georgette Shell, MD [facility]  If 7PM-7AM, please contact night-coverage [email or web address] Password [email or web address] 05/01/2017, 12:50 PM

## 2017-05-02 ENCOUNTER — Inpatient Hospital Stay (HOSPITAL_COMMUNITY): Payer: Medicare Other

## 2017-05-02 LAB — COMPREHENSIVE METABOLIC PANEL
ALT: 24 U/L (ref 14–54)
AST: 19 U/L (ref 15–41)
Albumin: 2.3 g/dL — ABNORMAL LOW (ref 3.5–5.0)
Alkaline Phosphatase: 31 U/L — ABNORMAL LOW (ref 38–126)
Anion gap: 6 (ref 5–15)
BUN: 71 mg/dL — ABNORMAL HIGH (ref 6–20)
CHLORIDE: 115 mmol/L — AB (ref 101–111)
CO2: 24 mmol/L (ref 22–32)
CREATININE: 1.74 mg/dL — AB (ref 0.44–1.00)
Calcium: 7.9 mg/dL — ABNORMAL LOW (ref 8.9–10.3)
GFR, EST AFRICAN AMERICAN: 31 mL/min — AB (ref >60)
GFR, EST NON AFRICAN AMERICAN: 27 mL/min — AB (ref >60)
Glucose, Bld: 144 mg/dL — ABNORMAL HIGH (ref 65–99)
POTASSIUM: 4.5 mmol/L (ref 3.5–5.1)
Sodium: 145 mmol/L (ref 135–145)
TOTAL PROTEIN: 4.6 g/dL — AB (ref 6.5–8.1)
Total Bilirubin: 0.5 mg/dL (ref 0.3–1.2)

## 2017-05-02 LAB — CBC WITH DIFFERENTIAL/PLATELET
Basophils Absolute: 0 10*3/uL (ref 0.0–0.1)
Basophils Relative: 0 %
EOS PCT: 0 %
Eosinophils Absolute: 0 10*3/uL (ref 0.0–0.7)
HEMATOCRIT: 27 % — AB (ref 36.0–46.0)
Hemoglobin: 8.8 g/dL — ABNORMAL LOW (ref 12.0–15.0)
LYMPHS ABS: 1 10*3/uL (ref 0.7–4.0)
LYMPHS PCT: 6 %
MCH: 30.6 pg (ref 26.0–34.0)
MCHC: 32.6 g/dL (ref 30.0–36.0)
MCV: 93.8 fL (ref 78.0–100.0)
Monocytes Absolute: 0.9 10*3/uL (ref 0.1–1.0)
Monocytes Relative: 5 %
NEUTROS ABS: 15.6 10*3/uL — AB (ref 1.7–7.7)
Neutrophils Relative %: 89 %
PLATELETS: 184 10*3/uL (ref 150–400)
RBC: 2.88 MIL/uL — ABNORMAL LOW (ref 3.87–5.11)
RDW: 14.4 % (ref 11.5–15.5)
WBC: 17.5 10*3/uL — ABNORMAL HIGH (ref 4.0–10.5)

## 2017-05-02 LAB — GLUCOSE, CAPILLARY
GLUCOSE-CAPILLARY: 123 mg/dL — AB (ref 65–99)
GLUCOSE-CAPILLARY: 172 mg/dL — AB (ref 65–99)
GLUCOSE-CAPILLARY: 174 mg/dL — AB (ref 65–99)
Glucose-Capillary: 119 mg/dL — ABNORMAL HIGH (ref 65–99)
Glucose-Capillary: 125 mg/dL — ABNORMAL HIGH (ref 65–99)
Glucose-Capillary: 102 mg/dL — ABNORMAL HIGH (ref 65–99)

## 2017-05-02 MED ORDER — MEROPENEM 500 MG IV SOLR
500.0000 mg | Freq: Two times a day (BID) | INTRAVENOUS | Status: DC
  Administered 2017-05-02 – 2017-05-03 (×2): 500 mg via INTRAVENOUS
  Filled 2017-05-02 (×2): qty 0.5

## 2017-05-02 MED ORDER — PANTOPRAZOLE SODIUM 40 MG PO PACK
40.0000 mg | PACK | Freq: Every day | ORAL | Status: DC
  Administered 2017-05-03 – 2017-05-05 (×3): 40 mg
  Filled 2017-05-02 (×3): qty 20

## 2017-05-02 MED ORDER — CHLORHEXIDINE GLUCONATE 0.12 % MT SOLN
15.0000 mL | Freq: Two times a day (BID) | OROMUCOSAL | Status: DC
  Administered 2017-05-02 – 2017-05-11 (×18): 15 mL via OROMUCOSAL
  Filled 2017-05-02 (×19): qty 15

## 2017-05-02 MED ORDER — ORAL CARE MOUTH RINSE
15.0000 mL | Freq: Two times a day (BID) | OROMUCOSAL | Status: DC
  Administered 2017-05-02 – 2017-05-11 (×11): 15 mL via OROMUCOSAL

## 2017-05-02 MED ORDER — ISOSORBIDE MONONITRATE 20 MG PO TABS
20.0000 mg | ORAL_TABLET | Freq: Two times a day (BID) | ORAL | Status: DC
  Administered 2017-05-02 – 2017-05-04 (×6): 20 mg via ORAL
  Filled 2017-05-02 (×7): qty 1

## 2017-05-02 MED ORDER — SODIUM CHLORIDE 0.45 % IV SOLN
INTRAVENOUS | Status: DC
  Administered 2017-05-02 – 2017-05-03 (×4): via INTRAVENOUS

## 2017-05-02 NOTE — Progress Notes (Signed)
PROGRESS NOTE    Rebecca [name]  LDJ:[id number] DOB: 1940/07/19 DOA: 04/24/2017 PCP: [name], MD  Brief Narrative: 76 year old female with PMH of COPD, not on home oxygen, PMR on chronic prednisone 10 MG daily, CAD status post DES to RCA 2006, stage III CKD, chronic diastolic CHF, MGUS, hypothyroid, recent hospitalization for right trochanteric bursitis, acute on chronic diastolic CHF and HCAP, discharged to SNF and subsequently home, treated for presumed UTI with Ceftin at discharge from SNF, presented with lethargy, fever, cough productive of yellow sputum and vomiting. Admitted for LLL pneumonia suspicious for aspiration pneumonia. Failed swallow evaluation on 11/14. Concern for esophageal dysmotility, less likely obstruction. [facility] consulted and workup completed. diet per speech therapy ordered but very poor oral intake and at risk for aspiration. Therefore placed cor track for temporary tube feeding on 11/16. Speech therapy following the patient.    Assessment & Plan:   Principal Problem:   Aspiration pneumonia ([place]) Active Problems:   Hypothyroidism   Schizoaffective disorder ([place])   GASTROESOPHAGEAL REFLUX, NO ESOPHAGITIS   Polymyalgia rheumatica (HCC)   Essential hypertension   Lobar pneumonia, unspecified organism (HCC)   TIA (transient ischemic attack)   Recurrent UTI   Dysphagia  Sepsis secondary to lobar/aspiration pneumonia/acute respiratory failure with hypoxia - Recently hospitalized 10/1-10/13 for HCAP, treated with vancomycin/Zosyn and discharged on oral Augmentin. - Given history of GERD, dysphagia, on modified diet at home, input by speech therapy (failed swallow evaluation), strongly suspect aspiration pneumonia. - Continue empirically started IV vancomycin and meropenem. - Pulmonary toilet. - Recommend follow-up chest x-ray in 4 weeks to ensure resolution of pneumonia findings. - Blood cultures 2: Negative to date. Sputum culture shows Candida. RSV PCR  negative. - Clinically improving. Cultures negative thus far. MRSA PCR negative. DC vancomycin. - continue meropenem  Dysphagia/vomiting - Speech therapy evaluated on 11/14 by MBS and indicated that she was a high risk for aspiration and noted food products retained in lower esophagus and recommended nothing by mouth. - Consulted [facility]. Family did not want to pursue any aggressive interventions i.e. EGD. As per GI, recent barium swallow did not show obstruction and hence low likelihood of that. In any event formal barium swallow was done on 11/15 which showed no obstruction. - modified diet was started on 11/15 but patient did not take except a few spoons. I discussed with speech therapy this morning advised that patient remains high risk for aspiration and they recommended core track tube feeding for a couple days to improve nutritional status and possibly swallowing mechanism and reassess early neXT week.  Leukocytosis improved on IV Zosyn.  Patient is also on steroids monitor.  Acute on chronic CKD stage III monitor renal functions daily.  PMR chronic steroids  TIA continue Plavix CAD continue Plavix Hypertension on Imdur and diltiazem metoprolol History of schizo affective disorder patient on Clozaril chronic basis. Interstitial cystitis previous culture grew Pseudomonas per infectious disease no antibiotic treatment unless patient is clearly symptomatic.  Patient is currently on meropenem. Chronic hip pain secondary to L5-S1 spondylosis right trochanteric bursitis/pseudogout status post steroid injection.  Continue colchicine    DVT prophylaxis heparin :Code Status full code :Family Communication: Discussed with daughter Disposition Plan: PT OT consult   Consultants: GI  Procedures:cortracktube for feeding Antimicrobials:meropenem Subjective: Patient denies any chest pain shortness of breath daughter feels that the mother is getting better  Objective: Resting in bed  in no acute distress feeling better Vitals:   05/02/17 0430 05/02/17 0300 05/02/17  3086 05/02/17 1217  BP: (!) 155/64 (!) 155/63  (!) 146/59  Pulse: 79 79  81  Resp: 18     Temp: 98.4 F (36.9 C)     TempSrc: Oral     SpO2: 97%  95%   Weight:      Height:        Intake/Output Summary (Last 24 hours) at 05/02/2017 1237 Last data filed at 05/02/2017 1200 Gross per 24 hour  Intake 415 ml  Output 1150 ml  Net -735 ml   Filed Weights   04/30/17 2253 05/01/17 0354 05/01/17 2126  Weight: 62.5 kg (137 lb 12.6 oz) 62.5 kg (137 lb 12.6 oz) 66.8 kg (147 lb 4.3 oz)    Examination:  General exam: Appears calm and comfortable  Respiratory system: Clear to auscultation. Respiratory effort normal. Cardiovascular system: S1 & S2 heard, RRR. No JVD, murmurs, rubs, gallops or clicks. No pedal edema. Gastrointestinal system: Abdomen is nondistended, soft and nontender. No organomegaly or masses felt. Normal bowel sounds heard. Central nervous system: Alert and oriented. No focal neurological deficits. Extremities: Symmetric 5 x 5 power. Skin: No rashes, lesions or ulcers Psychiatry: Judgement and insight appear normal. Mood & affect appropriate.     Data Reviewed: I have personally reviewed following labs and imaging studies  CBC: Recent Labs  Lab 04/27/17 0703 04/28/17 0420 04/29/17 0220 04/30/17 0233 05/02/17 0222  WBC 25.7* 18.3* 17.9* 14.6* 17.5*  NEUTROABS  --   --   --   --  15.6*  HGB 9.4* 8.4* 8.5* 7.9* 8.8*  HCT 27.2* 24.3* 25.4* 23.7* 27.0*  MCV 88.0 88.4 90.1 92.2 93.8  PLT 179 192 185 183 578   Basic Metabolic Panel: Recent Labs  Lab 04/27/17 0703 04/28/17 0420 04/29/17 0220 04/30/17 0233 05/02/17 0222  NA 136 140 144 144 145  K 4.0 3.9 4.1 4.0 4.5  CL 108 111 114* 115* 115*  CO2 16* 21* 21* 22 24  GLUCOSE 179* 167* 117* 155* 144*  BUN 79* 68* 60* 56* 71*  CREATININE 2.60* 2.17* 1.85* 1.75* 1.74*  CALCIUM 8.4* 8.3* 8.3* 8.0* 7.9*   GFR: Estimated  Creatinine Clearance: 24.5 mL/min (A) (by C-G formula based on SCr of 1.74 mg/dL (H)). Liver Function Tests: Recent Labs  Lab 05/02/17 0222  AST 19  ALT 24  ALKPHOS 31*  BILITOT 0.5  PROT 4.6*  ALBUMIN 2.3*   No results for input(s): LIPASE, AMYLASE in the last 168 hours. No results for input(s): AMMONIA in the last 168 hours. Coagulation Profile: No results for input(s): INR, PROTIME in the last 168 hours. Cardiac Enzymes: No results for input(s): CKTOTAL, [name], [name], TROPONINI in the last 168 hours. BNP (last 3 results) Recent Labs    03/29/17 1646  PROBNP 84.0   HbA1C: No results for input(s): HGBA1C in the last 72 hours. CBG: Recent Labs  Lab 05/01/17 1146 05/01/17 1938 05/02/17 0430 05/02/17 0814 05/02/17 1153  GLUCAP 188* 205* 123* 125* 102*   Lipid Profile: No results for input(s): CHOL, [name], [name], [name], [name], LDLDIRECT in the last 72 hours. Thyroid Function Tests: No results for input(s): TSH, T4TOTAL, FREET4, T3FREE, THYROIDAB in the last 72 hours. Anemia Panel: No results for input(s): VITAMINB12, FOLATE, FERRITIN, TIBC, IRON, RETICCTPCT in the last 72 hours. Sepsis Labs: Recent Labs  Lab 04/28/17 0420  PROCALCITON 2.27    Recent Results (from the past 240 hour(s))  Blood Culture (routine x 2)     Status: None   Collection  Time: 04/24/17  2:40 PM  Result Value Ref Range Status   Specimen Description BLOOD SITE NOT SPECIFIED  Final   Special Requests   Final    BOTTLES DRAWN AEROBIC AND ANAEROBIC Blood Culture results may not be optimal due to an inadequate volume of blood received in culture bottles   Culture NO GROWTH 5 DAYS  Final   Report Status 04/29/2017 FINAL  Final  Blood Culture (routine x 2)     Status: None   Collection Time: 04/24/17  2:57 PM  Result Value Ref Range Status   Specimen Description BLOOD RIGHT ANTECUBITAL  Final   Special Requests   Final    BOTTLES DRAWN AEROBIC AND ANAEROBIC Blood Culture results may not be  optimal due to an excessive volume of blood received in culture bottles   Culture NO GROWTH 5 DAYS  Final   Report Status 04/29/2017 FINAL  Final  Urine culture     Status: Abnormal   Collection Time: 04/24/17  3:01 PM  Result Value Ref Range Status   Specimen Description URINE, CATHETERIZED  Final   Special Requests NONE  Final   Culture >=100,000 COLONIES/mL ENTEROBACTER AEROGENES (A)  Final   Report Status 04/26/2017 FINAL  Final   Organism ID, Bacteria ENTEROBACTER AEROGENES (A)  Final      Susceptibility   Enterobacter aerogenes - MIC*    CEFAZOLIN >=64 RESISTANT Resistant     CEFTRIAXONE >=64 RESISTANT Resistant     CIPROFLOXACIN <=0.25 SENSITIVE Sensitive     GENTAMICIN <=1 SENSITIVE Sensitive     IMIPENEM 1 SENSITIVE Sensitive     NITROFURANTOIN 32 SENSITIVE Sensitive     TRIMETH/SULFA <=20 SENSITIVE Sensitive     PIP/TAZO >=128 RESISTANT Resistant     * >=100,000 COLONIES/mL ENTEROBACTER AEROGENES  Respiratory Panel by PCR     Status: None   Collection Time: 04/25/17 12:23 AM  Result Value Ref Range Status   Adenovirus NOT DETECTED NOT DETECTED Final   Coronavirus 229E NOT DETECTED NOT DETECTED Final   Coronavirus HKU1 NOT DETECTED NOT DETECTED Final   Coronavirus NL63 NOT DETECTED NOT DETECTED Final   Coronavirus OC43 NOT DETECTED NOT DETECTED Final   Metapneumovirus NOT DETECTED NOT DETECTED Final   Rhinovirus / Enterovirus NOT DETECTED NOT DETECTED Final   Influenza A NOT DETECTED NOT DETECTED Final   Influenza B NOT DETECTED NOT DETECTED Final   Parainfluenza Virus 1 NOT DETECTED NOT DETECTED Final   Parainfluenza Virus 2 NOT DETECTED NOT DETECTED Final   Parainfluenza Virus 3 NOT DETECTED NOT DETECTED Final   Parainfluenza Virus 4 NOT DETECTED NOT DETECTED Final   Respiratory Syncytial Virus NOT DETECTED NOT DETECTED Final   Bordetella pertussis NOT DETECTED NOT DETECTED Final   Chlamydophila pneumoniae NOT DETECTED NOT DETECTED Final   Mycoplasma pneumoniae NOT  DETECTED NOT DETECTED Final  MRSA PCR Screening     Status: None   Collection Time: 04/25/17 12:23 AM  Result Value Ref Range Status   MRSA by PCR NEGATIVE NEGATIVE Final    Comment:        The GeneXpert MRSA Assay (FDA approved for NASAL specimens only), is one component of a comprehensive MRSA colonization surveillance program. It is not intended to diagnose MRSA infection nor to guide or monitor treatment for MRSA infections.   Culture, sputum-assessment     Status: None   Collection Time: 04/25/17  6:50 AM  Result Value Ref Range Status   Specimen Description SPUTUM  Final  Special Requests NONE  Final   Sputum evaluation THIS SPECIMEN IS ACCEPTABLE FOR SPUTUM CULTURE  Final   Report Status 04/25/2017 FINAL  Final  Culture, respiratory (NON-Expectorated)     Status: None   Collection Time: 04/25/17  6:50 AM  Result Value Ref Range Status   Specimen Description SPUTUM  Final   Special Requests NONE Reflexed from [id number]  Final   Gram Stain   Final    ABUNDANT WBC PRESENT, PREDOMINANTLY PMN FEW SQUAMOUS EPITHELIAL CELLS PRESENT FEW GRAM POSITIVE COCCI IN PAIRS IN CHAINS RARE GRAM NEGATIVE RODS RARE BUDDING YEAST SEEN    Culture ABUNDANT CANDIDA ALBICANS  Final   Report Status 04/27/2017 FINAL  Final         Radiology Studies: No results found.      Scheduled Meds: . clopidogrel  75 mg Per Tube QHS  . clotrimazole   Topical BID  . cloZAPine  300 mg Per Tube QHS  . colchicine  0.3 mg Per Tube QHS  . dexamethasone  10 mg Intravenous Q24H  . diltiazem  60 mg Per Tube Q6H  . feeding supplement (NEPRO CARB STEADY)  1,000 mL Per Tube Q24H  . guaiFENesin  400 mg Per Tube TID  . heparin  5,000 Units Subcutaneous Q8H  . ipratropium-albuterol  3 mL Inhalation TID  . isosorbide mononitrate  20 mg Oral BID  . levothyroxine  75 mcg Per Tube QAC breakfast  . metoprolol tartrate  50 mg Per Tube BID  . mometasone-formoterol  2 puff Inhalation BID  . pantoprazole  sodium  40 mg Per Tube Daily  . polyethylene glycol  17 g Per Tube BID  . pravastatin  20 mg Per Tube QHS  . saccharomyces boulardii  250 mg Per Tube BID  . senna-docusate  2 tablet Per Tube BID  . tamsulosin  0.4 mg Oral Q breakfast   Continuous Infusions: . sodium chloride    . meropenem (MERREM) IV       LOS: 8 days      Georgette Shell, MD [facility]  If 7PM-7AM, please contact night-coverage [email or web address] Password [facility] 05/02/2017, 12:37 PM

## 2017-05-02 NOTE — Progress Notes (Signed)
Pt daughter Domenic Polite want pt to be turned to change position when pt is asleep, she was given scheduled Clozapine  and has been sleeping since, will continue to monitor

## 2017-05-02 NOTE — Progress Notes (Signed)
Pt admitted per stretcher from [facility] accompanied by RN and pt family on arrival to the floor pt was fully alert and oriented, ID bracelet verrified with pt vital signs are stable oriented to room and pt care equipment, has stage2 at the sacrum pink foam dressing in place, abrasion at the rt hand open to air, ecchymosis blt arms and legs, bruise at the abdomen from heparin shot, fall risk assessment done bed alarm on, pt daughter at the bedside has been encouraged to call when needed, both pt and daughter were able to demonstrate how to use the call light, prescribed treatment started

## 2017-05-02 NOTE — Care Management Important Message (Signed)
Important Message  Patient Details  Name: Rebecca [name] MRN: [id number] Date of Birth: 1940/11/20   Medicare Important Message Given:  Yes    [name] 05/02/2017, 10:40 AM

## 2017-05-02 NOTE — Progress Notes (Signed)
  Speech Language Pathology Treatment: Dysphagia  Patient Details Name: Rebecca [name] MRN: [id number] DOB: April 09, 1941 Today's Date: 05/02/2017 Time: 4008-6761 SLP Time Calculation (min) (ACUTE ONLY): 26 min  Assessment / Plan / Recommendation Clinical Impression  Chart review from 11/16. Intention was for Cortrak (due to poor intake) in addition to continue po (puree only) however pt made NPO Friday. Perhaps this gave her esophagitis time to heal/rest in addition to meds started for esophagitis by Palliative RN per daughter. Pt more alert this afternoon than SLP has seen since last week. Following oral care she consumed several bites pudding (with encouragement needed for second bite). Last week pt complaining of significant pain when swallowing puree (esophageal) however today denied pain. Plan is to repeat MBS tomorrow (afternoon per daughter request when she is more alert). Hopeful pt will be safe to initiate some form of liquids although suspect she may continue to have low intake. MBS tomorrow after lunch.    HPI HPI: Kandee A Coxis a 76 y.o.femalewith medical history significant ofCOPD, pna,PMR, CAD, CVA, pna, GERD, pancreatic mass, barium esophagram revealing mild esophageal dysmotility, puree diet at baseline (thin liquids), schizoaffective disorder, CKD stage IV, diastolic heart failure, MGUS, hypothyroidism who was most recently admitted at [facility] long hospital secondary to right-sided trochanteric bursitis, acute on chronic diastolic heart failure and healthcare acquired pneumonia. This admission found to have healthcare associated pna). MBS 09/2016 reveaked suspected primary esophageal deficits, pharyngeal residue, trace penetration thin to cords silent while consuming barium pill. Dys 3, thin recommended. New s/s aspiraiton exhibited 11/14 with recommendation for full objective assessment.        SLP Plan  MBS       Recommendations  Diet recommendations: Dysphagia 1  (puree);Pudding-thick liquid Medication Administration: Via alternative means                Oral Care Recommendations: Oral care BID Follow up Recommendations: 24 hour supervision/assistance SLP Visit Diagnosis: Dysphagia, oropharyngeal phase (R13.12) Plan: MBS       GO                [name] 05/02/2017, 2:51 PM  Orbie Pyo Zenovia Justman M.Ed [employer] [phone]

## 2017-05-03 ENCOUNTER — Inpatient Hospital Stay (HOSPITAL_COMMUNITY): Payer: Medicare Other

## 2017-05-03 LAB — GLUCOSE, CAPILLARY
GLUCOSE-CAPILLARY: 127 mg/dL — AB (ref 65–99)
GLUCOSE-CAPILLARY: 133 mg/dL — AB (ref 65–99)
GLUCOSE-CAPILLARY: 170 mg/dL — AB (ref 65–99)
Glucose-Capillary: 128 mg/dL — ABNORMAL HIGH (ref 65–99)
Glucose-Capillary: 198 mg/dL — ABNORMAL HIGH (ref 65–99)
Glucose-Capillary: 208 mg/dL — ABNORMAL HIGH (ref 65–99)

## 2017-05-03 MED ORDER — PREDNISONE 10 MG PO TABS
10.0000 mg | ORAL_TABLET | Freq: Every day | ORAL | Status: DC
  Administered 2017-05-04: 10 mg via ORAL
  Filled 2017-05-03: qty 1

## 2017-05-03 MED ORDER — RESOURCE THICKENUP CLEAR PO POWD
ORAL | Status: DC | PRN
  Filled 2017-05-03: qty 125

## 2017-05-03 NOTE — Progress Notes (Signed)
Physical Therapy Treatment Patient Details Name: Rebecca [name] MRN: [id number] DOB: 12-02-1940 Today's Date: 05/03/2017    History of Present Illness Pt is a 76 y.o. female admitted on 04/24/17 with workup for sepsis secondary to HCAP and acute hypoxemic respiratory failure. Pertinent PMH includes COPD, CAD, HTN, CKD IV, diastolic HF, polymyalgia rheumatica, Chronic hip pain secondary to L5-S1 spondylosis, right trochanteric bursitis (admitted 03/14/17 for hip pain; steroid injection on 03/24/17 with Dr. Ninfa Linden). Of note, recently admitted 10/1-10/13 for HCAP and treated with vanco/zosyn.    PT Comments    Patient more alert and participative this pm (4:00 pm).  Improved with bed mobility and transfers.     Follow Up Recommendations  SNF;Supervision/Assistance - 24 hour     Equipment Recommendations  Other (comment)(TBD)    Recommendations for Other Services       Precautions / Restrictions Precautions Precautions: Fall Restrictions Weight Bearing Restrictions: No    Mobility  Bed Mobility Overal bed mobility: Needs Assistance Bed Mobility: Rolling;Sidelying to Sit Rolling: Min guard Sidelying to sit: Min guard       General bed mobility comments: Patient using rail to assist with rolling.  Patient able to bring LE's off of bed, and to bring trunk up to sitting position.  Once upright, patient able to maintain sitting balance with min guard assist.  Transfers Overall transfer level: Needs assistance Equipment used: Rolling walker (2 wheeled) Transfers: Sit to/from [facility] Sit to Stand: Min assist;Mod assist Stand pivot transfers: Min assist;+2 safety/equipment       General transfer comment: Verbal cues for hand placement.  Assist to move to stance.  Patient able to take several short steps to pivot to recliner.  Assist to control descent into chair.  Ambulation/Gait             General Gait Details: NT   Stairs             Wheelchair Mobility    Modified Rankin (Stroke Patients Only)       Balance Overall balance assessment: Needs assistance Sitting-balance support: No upper extremity supported Sitting balance-Leahy Scale: Fair     Standing balance support: Bilateral upper extremity supported Standing balance-Leahy Scale: Poor Standing balance comment: Min assist to maintain balance along with UE support                            Cognition Arousal/Alertness: Awake/alert Behavior During Therapy: WFL for tasks assessed/performed Overall Cognitive Status: History of cognitive impairments - at baseline                                        Exercises General Exercises - Lower Extremity Ankle Circles/Pumps: AROM;Both;10 reps;Seated Long Arc Quad: AROM;Both;10 reps;Seated    General Comments General comments (skin integrity, edema, etc.): Daughter present during session.  Encourages patient.      Pertinent Vitals/Pain Pain Assessment: Faces Faces Pain Scale: Hurts little more Pain Location: "bottom" Pain Descriptors / Indicators: Grimacing;Sore Pain Intervention(s): Monitored during session;Repositioned    Home Living                      Prior Function            PT Goals (current goals can now be found in the care plan section) Acute Rehab PT Goals Patient Stated Goal: Get  stronger Progress towards PT goals: Progressing toward goals    Frequency    Min 2X/week      PT Plan Current plan remains appropriate    Co-evaluation              AM-PAC PT "6 Clicks" Daily Activity  Outcome Measure  Difficulty turning over in bed (including adjusting bedclothes, sheets and blankets)?: None Difficulty moving from lying on back to sitting on the side of the bed? : A Little Difficulty sitting down on and standing up from a chair with arms (e.g., wheelchair, bedside commode, etc,.)?: Unable Help needed moving to and from a bed to chair  (including a wheelchair)?: A Little Help needed walking in hospital room?: A Lot Help needed climbing 3-5 steps with a railing? : A Lot 6 Click Score: 15    End of Session Equipment Utilized During Treatment: Gait belt Activity Tolerance: Patient tolerated treatment well;Patient limited by fatigue Patient left: in chair;with call bell/phone within reach;with family/visitor present Nurse Communication: Other (comment)(Needs Purewick replaced) PT Visit Diagnosis: Other abnormalities of gait and mobility (R26.89);Muscle weakness (generalized) (M62.81)     Time: 1610-9604 PT Time Calculation (min) (ACUTE ONLY): 19 min  Charges:  $Therapeutic Activity: 8-22 mins                    G Codes:       Rebecca [name]. Sanjuana Kava, [facility] Acute Rehab Services Pager [place] 05/03/2017, 7:47 PM

## 2017-05-03 NOTE — Progress Notes (Addendum)
Occupational Therapy Treatment Patient Details Name: Rebecca [name] MRN: [id number] DOB: [birth year] Today's Date: 05/03/2017    History of present illness Pt is a 76 y.o. female admitted on 04/24/17 with workup for sepsis secondary to HCAP and acute hypoxemic respiratory failure. Pertinent PMH includes COPD, CAD, HTN, CKD IV, diastolic HF, polymyalgia rheumatica, Chronic hip pain secondary to L5-S1 spondylosis, right trochanteric bursitis (admitted 03/14/17 for hip pain; steroid injection on 03/24/17 with Dr. Ninfa Linden). Of note, recently admitted 10/1-10/13 for HCAP and treated with vanco/zosyn.   OT comments  Pt lethargic this date, and difficult to arouse.  She required min - max A for bed mobility, and mod A for transfer to/from [facility], as well as total A for peri care.  Pt with very limited activity tolerance, and required more assistance this date.  She is not progressing toward goals at this time.   Follow Up Recommendations  SNF;Supervision/Assistance - 24 hour    Equipment Recommendations  None recommended by OT    Recommendations for Other Services      Precautions / Restrictions Precautions Precautions: Fall       Mobility Bed Mobility Overal bed mobility: Needs Assistance Bed Mobility: Supine to Sit;Sit to Supine;Rolling Rolling: Min assist   Supine to sit: Max assist Sit to supine: Min assist   General bed mobility comments: Pt lethargic initially and slow to respond.  required assist with all aspects of bed mobility to move to EOB.  when returning to supine, she required assist to lift LEs onto the bed   Transfers Overall transfer level: Needs assistance Equipment used: 1 person hand held assist Transfers: Sit to/from [facility] Sit to Stand: Mod assist Stand pivot transfers: Mod assist       General transfer comment: pt requires assist to power up into standing as well as assist to pivot and to maintain balance     Balance Overall balance  assessment: Needs assistance Sitting-balance support: No upper extremity supported Sitting balance-Leahy Scale: Fair Sitting balance - Comments: able to maintain static sitting    Standing balance support: Bilateral upper extremity supported Standing balance-Leahy Scale: Poor Standing balance comment: required mod A to maintain static standing                            ADL either performed or assessed with clinical judgement   ADL Overall ADL's : Needs assistance/impaired                         Toilet Transfer: Moderate assistance;Stand-pivot;BSC   Toileting- Clothing Manipulation and Hygiene: Total assistance;Sit to/from stand       Functional mobility during ADLs: Moderate assistance General ADL Comments: Pt very fatigued this date, requiring max encouragement to participate      Vision       Perception     Praxis      Cognition Arousal/Alertness: Awake/alert Behavior During Therapy: WFL for tasks assessed/performed Overall Cognitive Status: History of cognitive impairments - at baseline                                          Exercises     Shoulder Instructions       General Comments daughter present     Pertinent Vitals/ Pain       Pain Assessment: Faces  Faces Pain Scale: Hurts even more Pain Location: R hip Pain Descriptors / Indicators: Aching;Grimacing;Moaning;Guarding Pain Intervention(s): Monitored during session;Repositioned;Patient requesting pain meds-RN notified  Home Living                                          Prior Functioning/Environment              Frequency  Min 2X/week        Progress Toward Goals  OT Goals(current goals can now be found in the care plan section)  Progress towards OT goals: Not progressing toward goals - comment     Plan Discharge plan remains appropriate    Co-evaluation                 AM-PAC PT "6 Clicks" Daily Activity      Outcome Measure   Help from another person eating meals?: A Little Help from another person taking care of personal grooming?: A Lot Help from another person toileting, which includes using toliet, bedpan, or urinal?: A Lot Help from another person bathing (including washing, rinsing, drying)?: A Lot Help from another person to put on and taking off regular upper body clothing?: A Lot Help from another person to put on and taking off regular lower body clothing?: Total 6 Click Score: 12    End of Session    OT Visit Diagnosis: Other abnormalities of gait and mobility (R26.89);Muscle weakness (generalized) (M62.81);Other symptoms and signs involving cognitive function   Activity Tolerance     Patient Left  In bed   Nurse Communication  mobility status         Time:  1151-1223 32 mins    Charges: 23-37 self care/Home Management Lucille Passy, OTR/L [phone]    Lucille Passy M 05/03/2017, 2:26 PM

## 2017-05-03 NOTE — Consult Note (Signed)
   [facility] CM Inpatient Consult   05/03/2017  Rebecca [name] May 02, 1941 [id number]    [facility] Care Management follow up.   Spoke with inpatient RNCM and inpatient LCSW to discuss that [facility] has been following. Discussed that disposition may be SNF. Also discussed that palliative medicine team is following. Noted that prognosis is poor according to PMT consult.   Went to bedside to speak with patient and daughter. However, therapy was in the room.  Will continue to follow along for disposition plans and engage as appropriate.   Marthenia Rolling, MSN-Ed, RN,BSN [facility] Liaison [phone]

## 2017-05-03 NOTE — Progress Notes (Signed)
Modified Barium Swallow Progress Note  Patient Details  Name: Rebecca [name] MRN: [id number] Date of Birth: 05/05/1941  Today's Date: 05/03/2017  Modified Barium Swallow completed.  Full report located under Chart Review in the Imaging Section.  Brief recommendations include the following:  Clinical Impression  Swallow function is slightly improved from prior MBS and SLP recommendating Dys 1 (puree) texture and initiation honey thick liquids via teaspoon only(prior MBS no liquids recommended). Pt's laryngeal elevation and incomplete epiglottic deflection resulted in mild laryngeal penetration with cup sips honey thick without pt sensation and consistent vallecular residue. Majority of the time she swallowed a second time which minimally decreased residue and kicked out penetrates x 1. Pt's esophagus was approximately 80% full with delayed emptying and pt requesting to stop after 4 bites (SLP able to encourge to complete MBS). Dys 1 recommended (pt's daughter prepares puree at home) and honey thick liquid via teaspoon only. She may have sips thin water after oral care. Pt will likey consume small amount of offered meals. Pt daughter reporting NGT may be taken out Friday appropriately as they are only a short term means of nutrition. Recommend pt remain upright 1 hour (if able) after meals, 2-3 swallows, volitional cough/throat clear during meals. Continue therapy to educate pt re: compensatory strategies. SLP will speak with daughter re: comfort liquids (thin) if this is appropriate in near future.    Swallow Evaluation Recommendations       SLP Diet Recommendations: Dysphagia 1 (Puree) solids;Honey thick liquids(liquids via teaspoon)   Liquid Administration via: Spoon   Medication Administration: Via alternative means       Compensations: Minimize environmental distractions;Slow rate;Small sips/bites;Multiple dry swallows after each bite/sip;Clear throat after each swallow;Hard cough after  swallow   Postural Changes: Remain semi-upright after after feeds/meals (Comment);Seated upright at 90 degrees   Oral Care Recommendations: Oral care BID        [name] 05/03/2017,4:20 PM  Orbie Pyo [place].Ed [employer] [phone]

## 2017-05-03 NOTE — Progress Notes (Signed)
Daily Progress Note   Patient Name: Rebecca [name]       Date: 05/03/2017 DOB: 1941/01/26  Age: 76 y.o. MRN#: [id number] Attending Physician: Georgette Shell, MD Primary Care Physician: [name], MD Admit Date: 04/24/2017  Reason for Consultation/Follow-up: Establishing goals of care  Subjective: Rebecca [name] is sleeping soundly and did not arouse. Daughter, Rebecca [name], at bedside.   Length of Stay: 9  Current Medications: Scheduled Meds:  . chlorhexidine  15 mL Mouth Rinse BID  . clopidogrel  75 mg Per Tube QHS  . clotrimazole   Topical BID  . cloZAPine  300 mg Per Tube QHS  . colchicine  0.3 mg Per Tube QHS  . diltiazem  60 mg Per Tube Q6H  . feeding supplement (NEPRO CARB STEADY)  1,000 mL Per Tube Q24H  . guaiFENesin  400 mg Per Tube TID  . heparin  5,000 Units Subcutaneous Q8H  . ipratropium-albuterol  3 mL Inhalation TID  . isosorbide mononitrate  20 mg Oral BID  . levothyroxine  75 mcg Per Tube QAC breakfast  . mouth rinse  15 mL Mouth Rinse q12n4p  . metoprolol tartrate  50 mg Per Tube BID  . mometasone-formoterol  2 puff Inhalation BID  . pantoprazole sodium  40 mg Per Tube Daily  . polyethylene glycol  17 g Per Tube BID  . pravastatin  20 mg Per Tube QHS  . [START ON 05/04/2017] predniSONE  10 mg Oral Q breakfast  . saccharomyces boulardii  250 mg Per Tube BID  . senna-docusate  2 tablet Per Tube BID  . tamsulosin  0.4 mg Oral Q breakfast    Continuous Infusions: . sodium chloride 150 mL/hr at 05/03/17 0611    PRN Meds: acetaminophen **OR** acetaminophen, fluticasone, ondansetron **OR** ondansetron (ZOFRAN) IV, traMADol  Physical Exam         Constitutional: She is oriented to person, place, and time. She appears well-developed. She appears lethargic. She  appears ill.  Frail, elderly  HENT:  Head: Normocephalic and atraumatic.  Cardiovascular: Normal rate and regular rhythm.  Pulmonary/Chest: Effort normal. No accessory muscle usage. No tachypnea. No respiratory distress.  Abdominal: Soft. Normal appearance.  Neurological: She appears lethargic - had a rough night.  Skin:  Extensive bruising to bilat arms (chronic steroid use + anticoagulation). Skin  thin and frail.   Nursing note and vitals reviewed.    Vital Signs: BP (!) 141/46   Pulse 69   Temp 98.4 F (36.9 C) (Oral)   Resp 12   Ht _0  (1.6 m)   Wt 66.8 kg (147 lb 4.3 oz)   SpO2 96%   BMI 26.09 kg/m  SpO2: SpO2: 96 % O2 Device: O2 Device: Not Delivered O2 Flow Rate: O2 Flow Rate (L/min): 3 L/min  Intake/output summary:   Intake/Output Summary (Last 24 hours) at 05/03/2017 1102 Last data filed at 05/03/2017 4097 Gross per 24 hour  Intake 2322.5 ml  Output 1250 ml  Net 1072.5 ml   LBM: Last BM Date: 05/02/17 Baseline Weight: Weight: 59.9 kg (132 lb) Most recent weight: Weight: 66.8 kg (147 lb 4.3 oz)       Palliative Assessment/Data: 30%   Flowsheet Rows     Most Recent Value  Intake Tab  Referral Department  Hospitalist  Unit at Time of Referral  Intermediate Care Unit  Palliative Care Primary Diagnosis  Neurology  Date Notified  04/29/17  Palliative Care Type  Return patient Palliative Care  Reason for referral  Clarify Goals of Care  Date of Admission  04/24/17  Date first seen by Palliative Care  04/29/17  # of days Palliative referral response time  0 Day(s)  # of days IP prior to Palliative referral  5  Clinical Assessment  Psychosocial & Spiritual Assessment  Palliative Care Outcomes      Patient Active Problem List   Diagnosis Date Noted  . Dysphagia 04/27/2017  . Pulmonary infiltrates c/w recurrent aspiration pneumonitis vs hcap 03/30/2017  . Prerenal azotemia 03/30/2017  . Dyspnea on exertion 03/29/2017  . Weakness   . Hypervolemia    . Lethargy   . Pain of right hip joint   . Acute right hip pain 03/14/2017  . Physical debility 03/08/2017  . Recurrent UTI 03/08/2017  . Ascites 03/08/2017  . Chronic pain disorder 03/07/2017  . Chronic constipation   . Community acquired pneumonia   . Palliative care encounter   . Encounter for hospice care discussion   . TIA (transient ischemic attack) 01/27/2017  . Pancreatic mass 01/27/2017  . Aortic atherosclerosis ([place]) 01/27/2017  . Hyponatremia 01/25/2017  . Generalized weakness 01/25/2017  . Fatigue 01/11/2017  . Mixed hyperlipidemia 12/02/2016  . Encephalopathy 11/20/2016  . Altered mental status 11/19/2016  . Aspiration pneumonia of both lower lobes due to gastric secretions ([place]) 10/10/2016  . Aspiration pneumonia ([place]) 10/07/2016  . Lobar pneumonia, unspecified organism ([place]) 10/07/2016  . Ileus ([place]) 10/07/2016  . AKI (acute kidney injury) ([place]) 09/12/2016  . HAP (hospital-acquired pneumonia) 09/12/2016  . Respiratory distress 09/12/2016  . Pressure injury of skin 07/15/2016  . Essential hypertension 07/14/2016  . Acute encephalopathy 07/14/2016  . Coronary artery disease involving native coronary artery of native heart without angina pectoris 05/14/2016  . CKD stage 4, GFR 15-29 ml/min  08/11/2015  . 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 Leukocytosis 01/28/2014  . History of TIA - May 2015- Plavix 10/16/2013  . Polymyalgia rheumatica ([place]) 10/03/2012  . Chronic diastolic (congestive) heart failure ([place]) 07/15/2012  . Trochanteric bursitis of right hip 04/11/2012  . Dysuria 08/30/2011  . History of small bowel obstruction 11/04/2010  . Schizoaffective disorder ([place]) 01/10/2008  . Dyslipidemia 01/25/2007  . Hypothyroidism 08/11/2006  . Anemia in chronic renal disease 08/11/2006  . GASTROESOPHAGEAL REFLUX, NO ESOPHAGITIS 08/11/2006    Palliative  Care Assessment & Plan   HPI: 76 y.o. female  with past medical history of  COPD, PMR on chronic prednisone, CAD, diastolic CHF, CKD stage III, MGUS, recent admission to [facility] with right-sided trochanteric bursitis and went to [facility] for rehab but had returned home recently with daughter. Was admitted on 04/24/2017 with lethargy and fever r/t UTI and aspiration pneumonia. Has struggled with worsening dysphagia and have proceeded with temporary Cortrak placement to allow for rest for esophagitis and SLP will reassess swallow function after the weekend. MBS scheduled for 1300 today.   Assessment: I had a conversation with Rebecca [name] at bedside. Rebecca [name] is tearful but shares that she has spoken with her brother and has had some time to think. She is beginning to accept that her goals are shifting for her mother. She tells me that she is still hopeful that her mother will improve but recognizes that her mother is declining and likely time is limited. She is hopeful to have at least one more Christmas with her.   We discussed MOST form and completed: DNR, Limited interventions (BiPAP/CPAP, IVF, antibiotics, etc), antibiotics as indicated, IVF long term if needed, temporary feeding tube for a defined trial period (NO PEG). Placed order for DNR with Amanda's permission. She is still concerned about her mother receiving adequate care d/t DNR status. Will continue to work with her and reassure her and ensure that their needs are met. Will continue to discuss plan depending on outcomes with swallowing. Will follow up tomorrow.   Recommendations/Plan:  Continue Cortrak for now.   MBS this afternoon.   Code Status:  DNR  Prognosis:   TBD but overall very poor with severe deconditioning and concern with aspiration risk and ability to meet adequate nutritional needs when feeding tube D/C.   Discharge Planning:  To Be Determined   Thank you for allowing the Palliative Medicine Team to assist in the care of this patient.   Total Time 60 min Prolonged Time Billed  no       Greater  than 50%  of this time was spent counseling and coordinating care related to the above assessment and plan.  Vinie Sill, NP Palliative Medicine Team Pager # [phone] (M-F 8a-5p) Team Phone # [phone] (Nights/Weekends)

## 2017-05-03 NOTE — Progress Notes (Signed)
Nutrition Follow-up  DOCUMENTATION CODES:   Not applicable  INTERVENTION:  Continue NePro at 58m/hr, provides 1512 calories, 68gm protein, and 6166mfree water  Monitor diet advancement  NUTRITION DIAGNOSIS:   Inadequate oral intake related to poor appetite as evidenced by per patient/family report. -new dx  GOAL:   Patient will meet greater than or equal to 90% of their needs -met with TF  MONITOR:   I & O's, Labs, TF tolerance, Weight trends, Skin  ASSESSMENT:   Rebecca [name] is a [age].o. female with multiple medical problems including COPD, PMR currently on chronic prednisone therapy, remote history of coronary artery disease, chronic kidney disease, diastolic heart failure, MGUS admitted to the hospital with lethargy and fever. Patient was subsequently diagnosed with aspiration pneumonia  Spoke with patient, patient's daughter at bedside. Tubefeed was held during visit - due to [facility] been tolerating tube feeds with no issues, no apparent needs.  Palliative following.  Labs reviewed:  CBGs 128, 127, 133  Medications reviewed and include:  Prednisone, Senokot-S 1/2 NS at 15065mr  Diet Order:  DIET - DYS 1 Room service appropriate? Yes; Fluid consistency: Honey Thick  EDUCATION NEEDS:   Education needs have been addressed  Skin:  Skin Assessment: Skin Integrity Issues: Skin Integrity Issues:: Stage I Stage I: sacrum  Last BM:  05/03/2017  Height:   Ht Readings from Last 1 Encounters:  05/01/17 5' 3"  (1.6 m)    Weight:   Wt Readings from Last 1 Encounters:  05/01/17 147 lb 4.3 oz (66.8 kg)    Ideal Body Weight:  52.3 kg  BMI:  Body mass index is 26.09 kg/m.  Estimated Nutritional Needs:   Kcal:  1500-1700  Protein:  65-80 grams  Fluid:  1.5-1.7 L  Rebecca Anisard, MS, RD LDN Inpatient Clinical Dietitian Pager [phone]

## 2017-05-03 NOTE — Progress Notes (Addendum)
PROGRESS NOTE    Rebecca [name]  HUD:[id number] DOB: 1940/09/19 DOA: 04/24/2017 PCP: [name], MD Brief Narrative:76 year old female with PMH of COPD, not on home oxygen, PMR on chronic prednisone 10 MG daily, CAD status post DES to RCA 2006, stage III CKD, chronic diastolic CHF, MGUS, hypothyroid, recent hospitalization for right trochanteric bursitis, acute on chronic diastolic CHF and HCAP, discharged to SNF and subsequently home, treated for presumed UTI with Ceftin at discharge from SNF, presented with lethargy, fever, cough productive of yellow sputum and vomiting. Admitted for LLL pneumonia suspicious for aspiration pneumonia. Failed swallow evaluation on 11/14. Concern for esophageal dysmotility, less likely obstruction. [facility] consulted and workup completed. diet per speech therapy ordered but very poor oral intake and at risk for aspiration. Therefore placed cor track for temporary tube feeding on 11/16. Speech therapyfollowing the patient.     Assessment & Plan:   Principal Problem:   Aspiration pneumonia ([place]) Active Problems:   Hypothyroidism   Schizoaffective disorder ([place])   GASTROESOPHAGEAL REFLUX, NO ESOPHAGITIS   Polymyalgia rheumatica (HCC)   Essential hypertension   Lobar pneumonia, unspecified organism (HCC)   TIA (transient ischemic attack)   Recurrent UTI   Dysphagia  Sepsis secondary to aspiration pneumonia Acute respiratory failure with hypoxia Dysphagia with aspiration Leukocytosis improved On chronic CKD improved PMR on chronic prednisone History of CAD on Plavix TIA on Plavix Hypertension History of schizoaffective disorder on Clozaril Chronic hip pain secondary to L5-S1 spondylosis and right trochanteric bursitis versus pseudogout status post steroid injection on colchicine  PLAN-patient received multiple courses of antibiotics meropenem was stopped today.  Continue SVN treatments around-the-clock.  followed by speech therapy.  She now has a  cortrack tube in place for feeding.  Her white count and renal functions daily.  IV steroids was DC'd today and placed her back on prednisone daily.  Will get PT evaluation patient most likely will need a skilled nursing facility placement.  Appreciate palliative care input.  DVT prophylaxis: heparin Code Status:dnr Family Communication dw daughter :Disposition Plan: tbd  Consultants:   gi  Procedures:cortrack Antimicrobials: None now. Subjective:feels better.   Objective:resting in bed. Vitals:   05/02/17 2116 05/03/17 0528 05/03/17 0902 05/03/17 1032  BP:  (!) 135/55  (!) 141/46  Pulse:  74  69  Resp:  12    Temp:  98.4 F (36.9 C)    TempSrc:  Oral    SpO2: 100% 100% 96%   Weight:      Height:        Intake/Output Summary (Last 24 hours) at 05/03/2017 1430 Last data filed at 05/03/2017 1200 Gross per 24 hour  Intake 2322.5 ml  Output 250 ml  Net 2072.5 ml   Filed Weights   04/30/17 2253 05/01/17 0354 05/01/17 2126  Weight: 62.5 kg (137 lb 12.6 oz) 62.5 kg (137 lb 12.6 oz) 66.8 kg (147 lb 4.3 oz)    Examination:  General exam: Appears calm and comfortable  Respiratory system: Clear to auscultation. Respiratory effort normal. Cardiovascular system: S1 & S2 heard, RRR. No JVD, murmurs, rubs, gallops or clicks. No pedal edema. Gastrointestinal system: Abdomen is nondistended, soft and nontender. No organomegaly or masses felt. Normal bowel sounds heard. Central nervous system: Alert and oriented. No focal neurological deficits. Extremities: Symmetric 5 x 5 power. Skin: No rashes, lesions or ulcers Psychiatry: Judgement and insight appear normal. Mood & affect appropriate.     Data Reviewed: I have personally reviewed following labs and imaging studies  CBC: Recent Labs  Lab 04/27/17 0703 04/28/17 0420 04/29/17 0220 04/30/17 0233 05/02/17 0222  WBC 25.7* 18.3* 17.9* 14.6* 17.5*  NEUTROABS  --   --   --   --  15.6*  HGB 9.4* 8.4* 8.5* 7.9* 8.8*  HCT  27.2* 24.3* 25.4* 23.7* 27.0*  MCV 88.0 88.4 90.1 92.2 93.8  PLT 179 192 185 183 993   Basic Metabolic Panel: Recent Labs  Lab 04/27/17 0703 04/28/17 0420 04/29/17 0220 04/30/17 0233 05/02/17 0222  NA 136 140 144 144 145  K 4.0 3.9 4.1 4.0 4.5  CL 108 111 114* 115* 115*  CO2 16* 21* 21* 22 24  GLUCOSE 179* 167* 117* 155* 144*  BUN 79* 68* 60* 56* 71*  CREATININE 2.60* 2.17* 1.85* 1.75* 1.74*  CALCIUM 8.4* 8.3* 8.3* 8.0* 7.9*   GFR: Estimated Creatinine Clearance: 24.5 mL/min (A) (by C-G formula based on SCr of 1.74 mg/dL (H)). Liver Function Tests: Recent Labs  Lab 05/02/17 0222  AST 19  ALT 24  ALKPHOS 31*  BILITOT 0.5  PROT 4.6*  ALBUMIN 2.3*   No results for input(s): LIPASE, AMYLASE in the last 168 hours. No results for input(s): AMMONIA in the last 168 hours. Coagulation Profile: No results for input(s): INR, PROTIME in the last 168 hours. Cardiac Enzymes: No results for input(s): CKTOTAL, [name], [name], TROPONINI in the last 168 hours. BNP (last 3 results) Recent Labs    03/29/17 1646  PROBNP 84.0   HbA1C: No results for input(s): HGBA1C in the last 72 hours. CBG: Recent Labs  Lab 05/02/17 2035 05/03/17 0030 05/03/17 0521 05/03/17 0818 05/03/17 1224  GLUCAP 174* 170* 133* 127* 128*   Lipid Profile: No results for input(s): CHOL, [name], [name], [name], [name], LDLDIRECT in the last 72 hours. Thyroid Function Tests: No results for input(s): TSH, T4TOTAL, FREET4, T3FREE, THYROIDAB in the last 72 hours. Anemia Panel: No results for input(s): VITAMINB12, FOLATE, FERRITIN, TIBC, IRON, RETICCTPCT in the last 72 hours. Sepsis Labs: Recent Labs  Lab 04/28/17 0420  PROCALCITON 2.27    Recent Results (from the past 240 hour(s))  Blood Culture (routine x 2)     Status: None   Collection Time: 04/24/17  2:40 PM  Result Value Ref Range Status   Specimen Description BLOOD SITE NOT SPECIFIED  Final   Special Requests   Final    BOTTLES DRAWN AEROBIC  AND ANAEROBIC Blood Culture results may not be optimal due to an inadequate volume of blood received in culture bottles   Culture NO GROWTH 5 DAYS  Final   Report Status 04/29/2017 FINAL  Final  Blood Culture (routine x 2)     Status: None   Collection Time: 04/24/17  2:57 PM  Result Value Ref Range Status   Specimen Description BLOOD RIGHT ANTECUBITAL  Final   Special Requests   Final    BOTTLES DRAWN AEROBIC AND ANAEROBIC Blood Culture results may not be optimal due to an excessive volume of blood received in culture bottles   Culture NO GROWTH 5 DAYS  Final   Report Status 04/29/2017 FINAL  Final  Urine culture     Status: Abnormal   Collection Time: 04/24/17  3:01 PM  Result Value Ref Range Status   Specimen Description URINE, CATHETERIZED  Final   Special Requests NONE  Final   Culture >=100,000 COLONIES/mL ENTEROBACTER AEROGENES (A)  Final   Report Status 04/26/2017 FINAL  Final   Organism ID, Bacteria ENTEROBACTER AEROGENES (A)  Final  Susceptibility   Enterobacter aerogenes - MIC*    CEFAZOLIN >=64 RESISTANT Resistant     CEFTRIAXONE >=64 RESISTANT Resistant     CIPROFLOXACIN <=0.25 SENSITIVE Sensitive     GENTAMICIN <=1 SENSITIVE Sensitive     IMIPENEM 1 SENSITIVE Sensitive     NITROFURANTOIN 32 SENSITIVE Sensitive     TRIMETH/SULFA <=20 SENSITIVE Sensitive     PIP/TAZO >=128 RESISTANT Resistant     * >=100,000 COLONIES/mL ENTEROBACTER AEROGENES  Respiratory Panel by PCR     Status: None   Collection Time: 04/25/17 12:23 AM  Result Value Ref Range Status   Adenovirus NOT DETECTED NOT DETECTED Final   Coronavirus 229E NOT DETECTED NOT DETECTED Final   Coronavirus HKU1 NOT DETECTED NOT DETECTED Final   Coronavirus NL63 NOT DETECTED NOT DETECTED Final   Coronavirus OC43 NOT DETECTED NOT DETECTED Final   Metapneumovirus NOT DETECTED NOT DETECTED Final   Rhinovirus / Enterovirus NOT DETECTED NOT DETECTED Final   Influenza A NOT DETECTED NOT DETECTED Final   Influenza  B NOT DETECTED NOT DETECTED Final   Parainfluenza Virus 1 NOT DETECTED NOT DETECTED Final   Parainfluenza Virus 2 NOT DETECTED NOT DETECTED Final   Parainfluenza Virus 3 NOT DETECTED NOT DETECTED Final   Parainfluenza Virus 4 NOT DETECTED NOT DETECTED Final   Respiratory Syncytial Virus NOT DETECTED NOT DETECTED Final   Bordetella pertussis NOT DETECTED NOT DETECTED Final   Chlamydophila pneumoniae NOT DETECTED NOT DETECTED Final   Mycoplasma pneumoniae NOT DETECTED NOT DETECTED Final  MRSA PCR Screening     Status: None   Collection Time: 04/25/17 12:23 AM  Result Value Ref Range Status   MRSA by PCR NEGATIVE NEGATIVE Final    Comment:        The GeneXpert MRSA Assay (FDA approved for NASAL specimens only), is one component of a comprehensive MRSA colonization surveillance program. It is not intended to diagnose MRSA infection nor to guide or monitor treatment for MRSA infections.   Culture, sputum-assessment     Status: None   Collection Time: 04/25/17  6:50 AM  Result Value Ref Range Status   Specimen Description SPUTUM  Final   Special Requests NONE  Final   Sputum evaluation THIS SPECIMEN IS ACCEPTABLE FOR SPUTUM CULTURE  Final   Report Status 04/25/2017 FINAL  Final  Culture, respiratory (NON-Expectorated)     Status: None   Collection Time: 04/25/17  6:50 AM  Result Value Ref Range Status   Specimen Description SPUTUM  Final   Special Requests NONE Reflexed from [id number]  Final   Gram Stain   Final    ABUNDANT WBC PRESENT, PREDOMINANTLY PMN FEW SQUAMOUS EPITHELIAL CELLS PRESENT FEW GRAM POSITIVE COCCI IN PAIRS IN CHAINS RARE GRAM NEGATIVE RODS RARE BUDDING YEAST SEEN    Culture ABUNDANT CANDIDA ALBICANS  Final   Report Status 04/27/2017 FINAL  Final         Radiology Studies: Dg Abd 1 View  Result Date: 05/02/2017 CLINICAL DATA:  Confirm NG tube placement EXAM: ABDOMEN - 1 VIEW COMPARISON:  04/29/2017 FINDINGS: Enteric tube is coiled in the left upper  quadrant consistent with location in the body of the stomach. The tip is directed superiorly, likely extending to the upper stomach. Diffusely stool-filled colon. Cardiac enlargement. Calcification of the aorta. Degenerative changes in the spine. Surgical clips in the right upper quadrant. Old right rib fractures. IMPRESSION: Enteric tube tip is coiled in the left upper quadrant consistent with location in the body of  the stomach with tip extending to the upper stomach. Electronically Signed   By: Lucienne Capers M.D.   On: 05/02/2017 21:26        Scheduled Meds: . chlorhexidine  15 mL Mouth Rinse BID  . clopidogrel  75 mg Per Tube QHS  . clotrimazole   Topical BID  . cloZAPine  300 mg Per Tube QHS  . colchicine  0.3 mg Per Tube QHS  . diltiazem  60 mg Per Tube Q6H  . feeding supplement (NEPRO CARB STEADY)  1,000 mL Per Tube Q24H  . guaiFENesin  400 mg Per Tube TID  . heparin  5,000 Units Subcutaneous Q8H  . ipratropium-albuterol  3 mL Inhalation TID  . isosorbide mononitrate  20 mg Oral BID  . levothyroxine  75 mcg Per Tube QAC breakfast  . mouth rinse  15 mL Mouth Rinse q12n4p  . metoprolol tartrate  50 mg Per Tube BID  . mometasone-formoterol  2 puff Inhalation BID  . pantoprazole sodium  40 mg Per Tube Daily  . polyethylene glycol  17 g Per Tube BID  . pravastatin  20 mg Per Tube QHS  . [START ON 05/04/2017] predniSONE  10 mg Oral Q breakfast  . saccharomyces boulardii  250 mg Per Tube BID  . senna-docusate  2 tablet Per Tube BID  . tamsulosin  0.4 mg Oral Q breakfast   Continuous Infusions: . sodium chloride 150 mL/hr at 05/03/17 1357     LOS: 9 days     Georgette Shell, MD [facility]  If 7PM-7AM, please contact night-coverage [email or web address] Password [email or web address] 05/03/2017, 2:30 PM

## 2017-05-04 ENCOUNTER — Other Ambulatory Visit: Payer: Self-pay

## 2017-05-04 ENCOUNTER — Inpatient Hospital Stay (HOSPITAL_COMMUNITY): Payer: Medicare Other

## 2017-05-04 DIAGNOSIS — Z789 Other specified health status: Secondary | ICD-10-CM

## 2017-05-04 DIAGNOSIS — N390 Urinary tract infection, site not specified: Secondary | ICD-10-CM

## 2017-05-04 DIAGNOSIS — Z0189 Encounter for other specified special examinations: Secondary | ICD-10-CM

## 2017-05-04 DIAGNOSIS — G459 Transient cerebral ischemic attack, unspecified: Secondary | ICD-10-CM

## 2017-05-04 DIAGNOSIS — R0902 Hypoxemia: Secondary | ICD-10-CM

## 2017-05-04 DIAGNOSIS — K219 Gastro-esophageal reflux disease without esophagitis: Secondary | ICD-10-CM

## 2017-05-04 LAB — BASIC METABOLIC PANEL
ANION GAP: 6 (ref 5–15)
BUN: 64 mg/dL — ABNORMAL HIGH (ref 6–20)
CALCIUM: 7.2 mg/dL — AB (ref 8.9–10.3)
CO2: 24 mmol/L (ref 22–32)
Chloride: 109 mmol/L (ref 101–111)
Creatinine, Ser: 1.54 mg/dL — ABNORMAL HIGH (ref 0.44–1.00)
GFR calc non Af Amer: 31 mL/min — ABNORMAL LOW (ref >60)
GFR, EST AFRICAN AMERICAN: 36 mL/min — AB (ref >60)
Glucose, Bld: 133 mg/dL — ABNORMAL HIGH (ref 65–99)
Potassium: 4.5 mmol/L (ref 3.5–5.1)
Sodium: 139 mmol/L (ref 135–145)

## 2017-05-04 LAB — GLUCOSE, CAPILLARY
GLUCOSE-CAPILLARY: 128 mg/dL — AB (ref 65–99)
GLUCOSE-CAPILLARY: 137 mg/dL — AB (ref 65–99)
GLUCOSE-CAPILLARY: 163 mg/dL — AB (ref 65–99)
GLUCOSE-CAPILLARY: 210 mg/dL — AB (ref 65–99)
Glucose-Capillary: 172 mg/dL — ABNORMAL HIGH (ref 65–99)
Glucose-Capillary: 133 mg/dL — ABNORMAL HIGH (ref 65–99)

## 2017-05-04 LAB — CBC
HCT: 26.8 % — ABNORMAL LOW (ref 36.0–46.0)
Hemoglobin: 8.9 g/dL — ABNORMAL LOW (ref 12.0–15.0)
MCH: 31.4 pg (ref 26.0–34.0)
MCHC: 33.2 g/dL (ref 30.0–36.0)
MCV: 94.7 fL (ref 78.0–100.0)
Platelets: 182 10*3/uL (ref 150–400)
RBC: 2.83 MIL/uL — ABNORMAL LOW (ref 3.87–5.11)
RDW: 14.7 % (ref 11.5–15.5)
WBC: 20.1 10*3/uL — AB (ref 4.0–10.5)

## 2017-05-04 LAB — LACTIC ACID, PLASMA: LACTIC ACID, VENOUS: 2.6 mmol/L — AB (ref 0.5–1.9)

## 2017-05-04 LAB — PROCALCITONIN

## 2017-05-04 MED ORDER — GI COCKTAIL ~~LOC~~
30.0000 mL | Freq: Two times a day (BID) | ORAL | Status: DC | PRN
  Filled 2017-05-04: qty 30

## 2017-05-04 MED ORDER — GI COCKTAIL ~~LOC~~
30.0000 mL | Freq: Two times a day (BID) | ORAL | Status: DC | PRN
  Administered 2017-05-04: 30 mL

## 2017-05-04 NOTE — Patient Outreach (Signed)
[facility]) Care Management  05/04/2017  Rebecca [name] [birth year] [id number]   Client admitted to the hospital on 04/24/17 and is currently inpatient. Per policy procedure, client's case will be closed. Client's case may be reopened upon discharge to the community pending discharge disposition.  RNCM noted [name] has been to see client inpatient.  Plan: close case.  Thea Silversmith, RN, MSN, [place] Coordinator Cell: [phone]

## 2017-05-04 NOTE — Progress Notes (Signed)
Daily Progress Note   Patient Name: Rebecca [name]       Date: 05/04/2017 DOB: Aug 08, 1940  Age: 76 y.o. MRN#: [id number] Attending Physician: Kayleen Memos, DO Primary Care Physician: [name], MD Admit Date: 04/24/2017  Reason for Consultation/Follow-up: Establishing goals of care  Subjective: Ms. Gorton is more alert. "I feel better." [name] at bedside.   Length of Stay: 10  Current Medications: Scheduled Meds:  . chlorhexidine  15 mL Mouth Rinse BID  . clopidogrel  75 mg Per Tube QHS  . clotrimazole   Topical BID  . cloZAPine  300 mg Per Tube QHS  . colchicine  0.3 mg Per Tube QHS  . diltiazem  60 mg Per Tube Q6H  . feeding supplement (NEPRO CARB STEADY)  1,000 mL Per Tube Q24H  . guaiFENesin  400 mg Per Tube TID  . heparin  5,000 Units Subcutaneous Q8H  . ipratropium-albuterol  3 mL Inhalation TID  . isosorbide mononitrate  20 mg Oral BID  . levothyroxine  75 mcg Per Tube QAC breakfast  . mouth rinse  15 mL Mouth Rinse q12n4p  . metoprolol tartrate  50 mg Per Tube BID  . mometasone-formoterol  2 puff Inhalation BID  . pantoprazole sodium  40 mg Per Tube Daily  . polyethylene glycol  17 g Per Tube BID  . pravastatin  20 mg Per Tube QHS  . predniSONE  10 mg Oral Q breakfast  . saccharomyces boulardii  250 mg Per Tube BID  . senna-docusate  2 tablet Per Tube BID  . tamsulosin  0.4 mg Oral Q breakfast    Continuous Infusions:   PRN Meds: acetaminophen **OR** acetaminophen, fluticasone, gi cocktail, ondansetron **OR** ondansetron (ZOFRAN) IV, RESOURCE THICKENUP CLEAR, traMADol  Physical Exam         Constitutional: She is oriented to person, place, and time. She appears well-developed. She appears lethargic. She appears ill.  Frail, elderly  HENT:  Head:  Normocephalic and atraumatic.  Cardiovascular: Normal rate and regular rhythm.  Pulmonary/Chest: Effort normal. No accessory muscle usage. No tachypnea. No respiratory distress.  Abdominal: Soft. Normal appearance.  Neurological: She appears more alert today.  Skin:  Extensive bruising to bilat arms (chronic steroid use + anticoagulation). Skin thin and frail.   Nursing note and vitals reviewed.  Vital Signs: BP (!) 124/52 (BP Location: Right Arm)   Pulse 79   Temp 98.7 F (37.1 C) (Oral)   Resp 18   Ht 5' 3"  (1.6 m)   Wt 67.2 kg (148 lb 2.4 oz)   SpO2 95%   BMI 26.24 kg/m  SpO2: SpO2: 95 % O2 Device: O2 Device: Not Delivered O2 Flow Rate: O2 Flow Rate (L/min): 3 L/min  Intake/output summary:   Intake/Output Summary (Last 24 hours) at 05/04/2017 1205 Last data filed at 05/04/2017 2025 Gross per 24 hour  Intake 1740 ml  Output -  Net 1740 ml   LBM: Last BM Date: 05/03/17 Baseline Weight: Weight: 59.9 kg (132 lb) Most recent weight: Weight: 67.2 kg (148 lb 2.4 oz)       Palliative Assessment/Data: 30%   Flowsheet Rows     Most Recent Value  Intake Tab  Referral Department  Hospitalist  Unit at Time of Referral  Intermediate Care Unit  Palliative Care Primary Diagnosis  Neurology  Date Notified  04/29/17  Palliative Care Type  Return patient Palliative Care  Reason for referral  Clarify Goals of Care  Date of Admission  04/24/17  Date first seen by Palliative Care  04/29/17  # of days Palliative referral response time  0 Day(s)  # of days IP prior to Palliative referral  5  Clinical Assessment  Psychosocial & Spiritual Assessment  Palliative Care Outcomes      Patient Active Problem List   Diagnosis Date Noted  . Dysphagia 04/27/2017  . Pulmonary infiltrates c/w recurrent aspiration pneumonitis vs hcap 03/30/2017  . Prerenal azotemia 03/30/2017  . Dyspnea on exertion 03/29/2017  . Weakness   . Hypervolemia   . Lethargy   . Pain of right hip joint    . Acute right hip pain 03/14/2017  . Physical debility 03/08/2017  . Recurrent UTI 03/08/2017  . Ascites 03/08/2017  . Chronic pain disorder 03/07/2017  . Chronic constipation   . Community acquired pneumonia   . Palliative care encounter   . Encounter for hospice care discussion   . TIA (transient ischemic attack) 01/27/2017  . Pancreatic mass 01/27/2017  . Aortic atherosclerosis ([place]) 01/27/2017  . Hyponatremia 01/25/2017  . Generalized weakness 01/25/2017  . Fatigue 01/11/2017  . Mixed hyperlipidemia 12/02/2016  . Encephalopathy 11/20/2016  . Altered mental status 11/19/2016  . Aspiration pneumonia of both lower lobes due to gastric secretions ([place]) 10/10/2016  . Aspiration pneumonia ([place]) 10/07/2016  . Lobar pneumonia, unspecified organism ([place]) 10/07/2016  . Ileus ([place]) 10/07/2016  . AKI (acute kidney injury) ([employer]) 09/12/2016  . HAP (hospital-acquired pneumonia) 09/12/2016  . Respiratory distress 09/12/2016  . Pressure injury of skin 07/15/2016  . Essential hypertension 07/14/2016  . Acute encephalopathy 07/14/2016  . Coronary artery disease involving native coronary artery of native heart without angina pectoris 05/14/2016  . CKD stage 4, GFR 15-29 ml/min  08/11/2015  . 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 Leukocytosis 01/28/2014  . History of TIA - May 2015- Plavix 10/16/2013  . Polymyalgia rheumatica ([place]) 10/03/2012  . Chronic diastolic (congestive) heart failure ([place]) 07/15/2012  . Trochanteric bursitis of right hip 04/11/2012  . Dysuria 08/30/2011  . History of small bowel obstruction 11/04/2010  . Schizoaffective disorder ([place]) 01/10/2008  . Dyslipidemia 01/25/2007  . Hypothyroidism 08/11/2006  . Anemia in chronic renal disease 08/11/2006  . GASTROESOPHAGEAL REFLUX, NO ESOPHAGITIS 08/11/2006    Palliative Care Assessment & Plan   HPI: 76 y.o. female  with past medical history of COPD, PMR on chronic prednisone, CAD, diastolic  CHF, CKD stage III, MGUS, recent admission to [facility] with right-sided trochanteric bursitis and went to [facility] for rehab but had returned home recently with daughter. Was admitted on 04/24/2017 with lethargy and fever r/t UTI and aspiration pneumonia. Has struggled with worsening dysphagia and have proceeded with temporary Cortrak placement to allow for rest for esophagitis and SLP will reassess swallow function after the weekend. MBS scheduled for 1300 05/03/17.   Assessment: I met again today with Ms. Heinke. She is in good spirits today. We discussed getting up to chair to eat. She says she does not want to get up. I asked "why not?" and she replies "ok I'll get up" and smiles and shrugs her shoulders. [name] is happy with her mother's progress as she tolerated ~1/2 magic cup yesterday along with a few bites here and there.   I spoke with [name] today about resources after discharge and utilizing [facility] palliative program upon return home and transition to hospice. [name] continues to be hopeful and very involved in her mother's care but more open to focus on comfort and hospice in general. MOST was completed yesterday. Therapeutic listening and emotional support provided.   Recommendations/Plan:  Continue Cortrak for now.   MBS done -  Dys I, honey thick fluids  Code Status:  DNR  Prognosis:   TBD but overall very poor with severe deconditioning and concern with aspiration risk and ability to meet adequate nutritional needs when feeding tube D/C.   Discharge Planning:  To Be Determined   Thank you for allowing the Palliative Medicine Team to assist in the care of this patient.   Total Time 40 min Prolonged Time Billed  no       Greater than 50%  of this time was spent counseling and coordinating care related to the above assessment and plan.  Vinie Sill, NP Palliative Medicine Team Pager # [phone] (M-F 8a-5p) Team Phone # [phone] (Nights/Weekends)

## 2017-05-04 NOTE — Progress Notes (Signed)
PROGRESS NOTE    Rebecca [name]  UUV:[id number] DOB: February 15, 1941 DOA: 04/24/2017 PCP: [name], MD Brief Narrative:76 year old female with PMH of COPD, not on home oxygen, PMR on chronic prednisone 10 MG daily, CAD status post DES to RCA 2006, stage III CKD, chronic diastolic CHF, MGUS, hypothyroid, recent hospitalization for right trochanteric bursitis, acute on chronic diastolic CHF and HCAP, discharged to SNF and subsequently home, treated for presumed UTI with Ceftin at discharge from SNF, presented with lethargy, fever, cough productive of yellow sputum and vomiting. Admitted for LLL pneumonia suspicious for aspiration pneumonia. Failed swallow evaluation on 11/14. Concern for esophageal dysmotility, less likely obstruction. [facility] consulted and workup completed. diet per speech therapy ordered but very poor oral intake and at risk for aspiration. Therefore placed cor track for temporary tube feeding on 11/16. Speech therapyfollowing the patient.  Pt seen and examined with her daughter at her bedside. Has no complaints. She is alert and interactive.   Assessment & Plan:   Principal Problem:   Aspiration pneumonia ([place]) Active Problems:   Hypothyroidism   Schizoaffective disorder ([place])   GASTROESOPHAGEAL REFLUX, NO ESOPHAGITIS   Polymyalgia rheumatica (HCC)   Essential hypertension   Lobar pneumonia, unspecified organism (HCC)   TIA (transient ischemic attack)   Recurrent UTI   Dysphagia  Sepsis secondary to aspiration pneumonia Acute respiratory failure with hypoxia Dysphagia with aspiration Leukocytosis improved On chronic CKD improved PMR on chronic prednisone History of CAD on Plavix TIA on Plavix Hypertension History of schizoaffective disorder on Clozaril Chronic hip pain secondary to L5-S1 spondylosis and right trochanteric bursitis versus pseudogout status post steroid injection on colchicine  PLAN-patient received multiple courses of antibiotics meropenem  was stopped today.  Continue SVN treatments around-the-clock.  followed by speech therapy.  She now has a cortrack tube in place for feeding.  Her white count and renal functions daily.  IV steroids was DC'd today and placed her back on prednisone daily.  Will get PT evaluation patient most likely will need a skilled nursing facility placement.  Appreciate palliative care input. Dysphagia 1 Honey thick liquid recommended by speech therapist.  DVT prophylaxis: heparin Code Status:dnr Family Communication dw daughter :Disposition Plan: Will stay another midnight to continue current medical management.  Consultants:   GI  Procedures:cortrack Antimicrobials: None now.   Vitals:   05/04/17 0035 05/04/17 0540 05/04/17 0757 05/04/17 0814  BP: (!) 118/59 131/65  (!) 124/52  Pulse: 73 82  79  Resp: 20 18    Temp:  98.7 F (37.1 C)    TempSrc:  Oral    SpO2: 96% 96% 95%   Weight:  67.2 kg (148 lb 2.4 oz)    Height:        Intake/Output Summary (Last 24 hours) at 05/04/2017 0815 Last data filed at 05/04/2017 0521 Gross per 24 hour  Intake 1740 ml  Output -  Net 1740 ml   Filed Weights   05/01/17 0354 05/01/17 2126 05/04/17 0540  Weight: 62.5 kg (137 lb 12.6 oz) 66.8 kg (147 lb 4.3 oz) 67.2 kg (148 lb 2.4 oz)    Examination:  General exam: 76 yo CF WD WN NAD, cortrack in place Respiratory system: Clear to auscultation. Respiratory effort normal. Cardiovascular system: S1 & S2 heard, RRR. No JVD, murmurs, rubs, gallops or clicks. No pedal edema. Gastrointestinal system: Abdomen is nondistended, soft and nontender. No organomegaly or masses felt. Normal bowel sounds heard. Central nervous system: Alert and oriented. No focal neurological deficits. Extremities:  Symmetric 5 x 5 power. Skin: Bruising in upper extremities, thin skin. Psychiatry: Judgement and insight appear normal. Mood & affect appropriate.     Data Reviewed: I have personally reviewed following labs and imaging  studies  CBC: Recent Labs  Lab 04/28/17 0420 04/29/17 0220 04/30/17 0233 05/02/17 0222  WBC 18.3* 17.9* 14.6* 17.5*  NEUTROABS  --   --   --  15.6*  HGB 8.4* 8.5* 7.9* 8.8*  HCT 24.3* 25.4* 23.7* 27.0*  MCV 88.4 90.1 92.2 93.8  PLT 192 185 183 458   Basic Metabolic Panel: Recent Labs  Lab 04/28/17 0420 04/29/17 0220 04/30/17 0233 05/02/17 0222  NA 140 144 144 145  K 3.9 4.1 4.0 4.5  CL 111 114* 115* 115*  CO2 21* 21* 22 24  GLUCOSE 167* 117* 155* 144*  BUN 68* 60* 56* 71*  CREATININE 2.17* 1.85* 1.75* 1.74*  CALCIUM 8.3* 8.3* 8.0* 7.9*   GFR: Estimated Creatinine Clearance: 24.5 mL/min (A) (by C-G formula based on SCr of 1.74 mg/dL (H)). Liver Function Tests: Recent Labs  Lab 05/02/17 0222  AST 19  ALT 24  ALKPHOS 31*  BILITOT 0.5  PROT 4.6*  ALBUMIN 2.3*   No results for input(s): LIPASE, AMYLASE in the last 168 hours. No results for input(s): AMMONIA in the last 168 hours. Coagulation Profile: No results for input(s): INR, PROTIME in the last 168 hours. Cardiac Enzymes: No results for input(s): CKTOTAL, [name], [name], TROPONINI in the last 168 hours. BNP (last 3 results) Recent Labs    03/29/17 1646  PROBNP 84.0   HbA1C: No results for input(s): HGBA1C in the last 72 hours. CBG: Recent Labs  Lab 05/03/17 1655 05/03/17 2030 05/04/17 0021 05/04/17 0543 05/04/17 0803  GLUCAP 198* 208* 210* 137* 128*   Lipid Profile: No results for input(s): CHOL, [name], [name], [name], [name], LDLDIRECT in the last 72 hours. Thyroid Function Tests: No results for input(s): TSH, T4TOTAL, FREET4, T3FREE, THYROIDAB in the last 72 hours. Anemia Panel: No results for input(s): VITAMINB12, FOLATE, FERRITIN, TIBC, IRON, RETICCTPCT in the last 72 hours. Sepsis Labs: Recent Labs  Lab 04/28/17 0420  PROCALCITON 2.27    Recent Results (from the past 240 hour(s))  Blood Culture (routine x 2)     Status: None   Collection Time: 04/24/17  2:40 PM  Result Value Ref  Range Status   Specimen Description BLOOD SITE NOT SPECIFIED  Final   Special Requests   Final    BOTTLES DRAWN AEROBIC AND ANAEROBIC Blood Culture results may not be optimal due to an inadequate volume of blood received in culture bottles   Culture NO GROWTH 5 DAYS  Final   Report Status 04/29/2017 FINAL  Final  Blood Culture (routine x 2)     Status: None   Collection Time: 04/24/17  2:57 PM  Result Value Ref Range Status   Specimen Description BLOOD RIGHT ANTECUBITAL  Final   Special Requests   Final    BOTTLES DRAWN AEROBIC AND ANAEROBIC Blood Culture results may not be optimal due to an excessive volume of blood received in culture bottles   Culture NO GROWTH 5 DAYS  Final   Report Status 04/29/2017 FINAL  Final  Urine culture     Status: Abnormal   Collection Time: 04/24/17  3:01 PM  Result Value Ref Range Status   Specimen Description URINE, CATHETERIZED  Final   Special Requests NONE  Final   Culture >=100,000 COLONIES/mL ENTEROBACTER AEROGENES (A)  Final  Report Status 04/26/2017 FINAL  Final   Organism ID, Bacteria ENTEROBACTER AEROGENES (A)  Final      Susceptibility   Enterobacter aerogenes - MIC*    CEFAZOLIN >=64 RESISTANT Resistant     CEFTRIAXONE >=64 RESISTANT Resistant     CIPROFLOXACIN <=0.25 SENSITIVE Sensitive     GENTAMICIN <=1 SENSITIVE Sensitive     IMIPENEM 1 SENSITIVE Sensitive     NITROFURANTOIN 32 SENSITIVE Sensitive     TRIMETH/SULFA <=20 SENSITIVE Sensitive     PIP/TAZO >=128 RESISTANT Resistant     * >=100,000 COLONIES/mL ENTEROBACTER AEROGENES  Respiratory Panel by PCR     Status: None   Collection Time: 04/25/17 12:23 AM  Result Value Ref Range Status   Adenovirus NOT DETECTED NOT DETECTED Final   Coronavirus 229E NOT DETECTED NOT DETECTED Final   Coronavirus HKU1 NOT DETECTED NOT DETECTED Final   Coronavirus NL63 NOT DETECTED NOT DETECTED Final   Coronavirus OC43 NOT DETECTED NOT DETECTED Final   Metapneumovirus NOT DETECTED NOT DETECTED  Final   Rhinovirus / Enterovirus NOT DETECTED NOT DETECTED Final   Influenza A NOT DETECTED NOT DETECTED Final   Influenza B NOT DETECTED NOT DETECTED Final   Parainfluenza Virus 1 NOT DETECTED NOT DETECTED Final   Parainfluenza Virus 2 NOT DETECTED NOT DETECTED Final   Parainfluenza Virus 3 NOT DETECTED NOT DETECTED Final   Parainfluenza Virus 4 NOT DETECTED NOT DETECTED Final   Respiratory Syncytial Virus NOT DETECTED NOT DETECTED Final   Bordetella pertussis NOT DETECTED NOT DETECTED Final   Chlamydophila pneumoniae NOT DETECTED NOT DETECTED Final   Mycoplasma pneumoniae NOT DETECTED NOT DETECTED Final  MRSA PCR Screening     Status: None   Collection Time: 04/25/17 12:23 AM  Result Value Ref Range Status   MRSA by PCR NEGATIVE NEGATIVE Final    Comment:        The GeneXpert MRSA Assay (FDA approved for NASAL specimens only), is one component of a comprehensive MRSA colonization surveillance program. It is not intended to diagnose MRSA infection nor to guide or monitor treatment for MRSA infections.   Culture, sputum-assessment     Status: None   Collection Time: 04/25/17  6:50 AM  Result Value Ref Range Status   Specimen Description SPUTUM  Final   Special Requests NONE  Final   Sputum evaluation THIS SPECIMEN IS ACCEPTABLE FOR SPUTUM CULTURE  Final   Report Status 04/25/2017 FINAL  Final  Culture, respiratory (NON-Expectorated)     Status: None   Collection Time: 04/25/17  6:50 AM  Result Value Ref Range Status   Specimen Description SPUTUM  Final   Special Requests NONE Reflexed from [id number]  Final   Gram Stain   Final    ABUNDANT WBC PRESENT, PREDOMINANTLY PMN FEW SQUAMOUS EPITHELIAL CELLS PRESENT FEW GRAM POSITIVE COCCI IN PAIRS IN CHAINS RARE GRAM NEGATIVE RODS RARE BUDDING YEAST SEEN    Culture ABUNDANT CANDIDA ALBICANS  Final   Report Status 04/27/2017 FINAL  Final         Radiology Studies: Dg Abd 1 View  Result Date: 05/02/2017 CLINICAL DATA:   Confirm NG tube placement EXAM: ABDOMEN - 1 VIEW COMPARISON:  04/29/2017 FINDINGS: Enteric tube is coiled in the left upper quadrant consistent with location in the body of the stomach. The tip is directed superiorly, likely extending to the upper stomach. Diffusely stool-filled colon. Cardiac enlargement. Calcification of the aorta. Degenerative changes in the spine. Surgical clips in the right upper quadrant. Old  right rib fractures. IMPRESSION: Enteric tube tip is coiled in the left upper quadrant consistent with location in the body of the stomach with tip extending to the upper stomach. Electronically Signed   By: Lucienne Capers M.D.   On: 05/02/2017 21:26   Dg Swallowing Func-speech Pathology  Result Date: 05/03/2017 Objective Swallowing Evaluation: Type of Study: MBS-Modified Barium Swallow Study  Patient Details Name: DOYNE ELLINGER MRN: [id number] Date of Birth: 17-Nov-1940 Today's Date: 05/03/2017 Time: SLP Start Time (ACUTE ONLY): 4132 -SLP Stop Time (ACUTE ONLY): 1327 SLP Time Calculation (min) (ACUTE ONLY): 20 min Past Medical History: Past Medical History: Diagnosis Date . AKI (acute kidney injury) ([place])  . Anemia in chronic renal disease  . Aortic atherosclerosis ([place]) 01/27/2017 . CAD (coronary artery disease) 2006  a. s/p DES to RCA in 2006 b. low-risk NST in 2014 c. cath in 08/2015 showing patent RCA stent with nonobstructive disease . CKD (chronic kidney disease) stage 4, GFR 15-29 ml/min (HCC)  . Complication of anesthesia  . COPD (chronic obstructive pulmonary disease) ([place])  . Diabetes mellitus  . Diastolic heart failure, NYHA class 1 ([place])  . Dyslipidemia  . Dysuria 01/06/2015 . Frozen shoulder   right . Gastroesophageal reflux  . HAP (hospital-acquired pneumonia)  . Headache  . Hyperkalemia 01/01/2016 . Hypothyroidism  . MGUS (monoclonal gammopathy of unknown significance) 01/28/2014 . Obesity  . Pancreatic mass 01/27/2017 . Pneumonia 06/2016 . Polymyalgia rheumatica ([place])  . PONV (postoperative  nausea and vomiting)  . Renal insufficiency  . Respiratory distress  . Schizoaffective disorder ([place])  . Sepsis due to pneumonia ([place])  . Shortness of breath  . Stroke [facility])  Past Surgical History: Past Surgical History: Procedure Laterality Date . ABDOMINAL HYSTERECTOMY   . CARDIAC CATHETERIZATION N/A 08/13/2015  Procedure: Left Heart Cath and Coronary Angiography;  Surgeon: Jettie Booze, MD;  Location: [facility];  Service: Cardiovascular;  Laterality: N/A; . CATARACT EXTRACTION   . CHOLECYSTECTOMY   . CORONARY ANGIOPLASTY WITH STENT PLACEMENT  2006  [place] DES to RCA, 50 % residual . PARTIAL HYSTERECTOMY  1995 HPI: Ludell A Coxis a 76 y.o.femalewith medical history significant ofCOPD, pna,PMR, CAD, CVA, pna, GERD, pancreatic mass, barium esophagram revealing mild esophageal dysmotility, puree diet at baseline (thin liquids), schizoaffective disorder, CKD stage IV, diastolic heart failure, MGUS, hypothyroidism who was most recently admitted at [facility] long hospital secondary to right-sided trochanteric bursitis, acute on chronic diastolic heart failure and healthcare acquired pneumonia. This admission found to have healthcare associated pna). MBS 09/2016 reveaked suspected primary esophageal deficits, pharyngeal residue, trace penetration thin to cords silent while consuming barium pill. Dys 3, thin recommended. Repeat MBS today for possible recommendation for liquids.   Subjective: Pt resting in recliner. Daughter present Assessment / Plan / Recommendation CHL IP CLINICAL IMPRESSIONS 05/03/2017 Clinical Impression Swallow function is slightly improved from prior MBS and SLP recommendating Dys 1 (puree) texture and initiation honey thick liquids via teaspoon only(prior MBS no liquids recommended). Pt's laryngeal elevation and incomplete epiglottic deflection resulted in mild laryngeal penetration with cup sips honey thick without pt sensation and consistent vallecular residue. Majority of the time  she swallowed a second time which minimally decreased residue and kicked out penetrates x 1. Pt's esophagus was approximately 80% full with delayed emptying and pt requesting to stop after 4 bites (SLP able to encourge to complete MBS). Dys 1 recommended (pt's daughter prepares puree at home) and honey thick liquid via teaspoon only. She may have sips  thin water after oral care. Pt will likey consume small amount of offered meals. Pt daughter reporting NGT may be taken out Friday appropriately as they are only a short term means of nutrition. Recommend pt remain upright 1 hour (if able) after meals, 2-3 swallows, volitional cough/throat clear during meals. Continue therapy to educate pt re: compensatory strategies. SLP will speak with daughter re: comfort liquids (thin) if this is appropriate in near future.  SLP Visit Diagnosis Dysphagia, pharyngeal phase (R13.13) Attention and concentration deficit following -- Frontal lobe and executive function deficit following -- Impact on safety and function Moderate aspiration risk   CHL IP TREATMENT RECOMMENDATION 05/03/2017 Treatment Recommendations Therapy as outlined in treatment plan below   Prognosis 05/03/2017 Prognosis for Safe Diet Advancement Fair Barriers to Reach Goals Severity of deficits Barriers/Prognosis Comment -- CHL IP DIET RECOMMENDATION 05/03/2017 SLP Diet Recommendations Dysphagia 1 (Puree) solids;Honey thick liquids Liquid Administration via Spoon Medication Administration Via alternative means Compensations Minimize environmental distractions;Slow rate;Small sips/bites;Multiple dry swallows after each bite/sip;Clear throat after each swallow;Hard cough after swallow Postural Changes Remain semi-upright after after feeds/meals (Comment);Seated upright at 90 degrees   CHL IP OTHER RECOMMENDATIONS 05/03/2017 Recommended Consults -- Oral Care Recommendations Oral care BID Other Recommendations --   CHL IP FOLLOW UP RECOMMENDATIONS 05/03/2017 Follow up  Recommendations 24 hour supervision/assistance   CHL IP FREQUENCY AND DURATION 05/03/2017 Speech Therapy Frequency (ACUTE ONLY) min 2x/week Treatment Duration 2 weeks      CHL IP ORAL PHASE 05/03/2017 Oral Phase Impaired Oral - Pudding Teaspoon -- Oral - Pudding Cup -- Oral - Honey Teaspoon Delayed oral transit Oral - Honey Cup Delayed oral transit Oral - Nectar Teaspoon -- Oral - Nectar Cup NT Oral - Nectar Straw -- Oral - Thin Teaspoon -- Oral - Thin Cup NT Oral - Thin Straw -- Oral - Puree Delayed oral transit Oral - Mech Soft -- Oral - Regular -- Oral - Multi-Consistency -- Oral - Pill -- Oral Phase - Comment --  CHL IP PHARYNGEAL PHASE 05/03/2017 Pharyngeal Phase Impaired Pharyngeal- Pudding Teaspoon -- Pharyngeal -- Pharyngeal- Pudding Cup -- Pharyngeal -- Pharyngeal- Honey Teaspoon Reduced laryngeal elevation;Reduced epiglottic inversion;Penetration/Aspiration during swallow;Reduced anterior laryngeal mobility;Reduced airway/laryngeal closure Pharyngeal Material enters airway, remains ABOVE vocal cords and not ejected out Pharyngeal- Honey Cup Reduced epiglottic inversion;Reduced laryngeal elevation;Reduced airway/laryngeal closure;Penetration/Aspiration during swallow;Pharyngeal residue - valleculae Pharyngeal Material enters airway, remains ABOVE vocal cords and not ejected out Pharyngeal- Nectar Teaspoon -- Pharyngeal -- Pharyngeal- Nectar Cup NT Pharyngeal -- Pharyngeal- Nectar Straw -- Pharyngeal -- Pharyngeal- Thin Teaspoon -- Pharyngeal -- Pharyngeal- Thin Cup NT Pharyngeal -- Pharyngeal- Thin Straw -- Pharyngeal -- Pharyngeal- Puree Pharyngeal residue - valleculae;Reduced laryngeal elevation;Reduced epiglottic inversion Pharyngeal -- Pharyngeal- Mechanical Soft -- Pharyngeal -- Pharyngeal- Regular -- Pharyngeal -- Pharyngeal- Multi-consistency -- Pharyngeal -- Pharyngeal- Pill -- Pharyngeal -- Pharyngeal Comment --  CHL IP CERVICAL ESOPHAGEAL PHASE 05/03/2017 Cervical Esophageal Phase WFL Pudding  Teaspoon -- Pudding Cup -- Honey Teaspoon -- Honey Cup -- Nectar Teaspoon -- Nectar Cup -- Nectar Straw -- Thin Teaspoon -- Thin Cup -- Thin Straw -- Puree -- Mechanical Soft -- Regular -- Multi-consistency -- Pill -- Cervical Esophageal Comment -- CHL IP GO 10/17/2013 Functional Assessment Tool Used Mini-mental State Examination, ASHA NOMS, clinical judgment Functional Limitations Memory Swallow Current Status [phone]) (None) Swallow Goal Status (T7322) (None) Swallow Discharge Status (G2542) (None) Motor Speech Current Status (H0623) (None) Motor Speech Goal Status (J6283) (None) Motor Speech Goal Status (T5176) (None) Spoken Language Comprehension  Current Status [phone]) (None) Spoken Language Comprehension Goal Status [phone]) (None) Spoken Language Comprehension Discharge Status [phone]) (None) Spoken Language Expression Current Status [phone]) (None) Spoken Language Expression Goal Status [phone]) (None) Spoken Language Expression Discharge Status [phone]) (None) Attention Current Status (V7846) (None) Attention Goal Status (N6295) (None) Attention Discharge Status [phone]) (None) Memory Current Status (K4401) CI Memory Goal Status (U2725) CI Memory Discharge Status (G9170) CI Voice Current Status (D6644) (None) Voice Goal Status (I3474) (None) Voice Discharge Status (Q5956) (None) Other Speech-Language Pathology Functional Limitation Current Status (L8756) (None) Other Speech-Language Pathology Functional Limitation Goal Status (E3329) (None) Other Speech-Language Pathology Functional Limitation Discharge Status [phone]) (None) [name] 05/03/2017, 4:10 PM  Orbie Pyo Colvin Caroli.Ed CCC-SLP Pager [phone]                  Scheduled Meds: . chlorhexidine  15 mL Mouth Rinse BID  . clopidogrel  75 mg Per Tube QHS  . clotrimazole   Topical BID  . cloZAPine  300 mg Per Tube QHS  . colchicine  0.3 mg Per Tube QHS  . diltiazem  60 mg Per Tube Q6H  . feeding supplement (NEPRO CARB STEADY)  1,000 mL Per  Tube Q24H  . guaiFENesin  400 mg Per Tube TID  . heparin  5,000 Units Subcutaneous Q8H  . ipratropium-albuterol  3 mL Inhalation TID  . isosorbide mononitrate  20 mg Oral BID  . levothyroxine  75 mcg Per Tube QAC breakfast  . mouth rinse  15 mL Mouth Rinse q12n4p  . metoprolol tartrate  50 mg Per Tube BID  . mometasone-formoterol  2 puff Inhalation BID  . pantoprazole sodium  40 mg Per Tube Daily  . polyethylene glycol  17 g Per Tube BID  . pravastatin  20 mg Per Tube QHS  . predniSONE  10 mg Oral Q breakfast  . saccharomyces boulardii  250 mg Per Tube BID  . senna-docusate  2 tablet Per Tube BID  . tamsulosin  0.4 mg Oral Q breakfast   Continuous Infusions:    LOS: 10 days     Kayleen Memos, MD [facility]  If 7PM-7AM, please contact night-coverage [email or web address] Password [email or web address] 05/04/2017, 8:15 AM

## 2017-05-04 NOTE — Consult Note (Signed)
   [facility] CM Inpatient Consult   05/04/2017  Rebecca [name] Apr 19, 1941 [id number]   [facility] Care Management follow up.  Went to bedside. Spoke with patient's daughter, Rebecca [name] at bedside. Rebecca [name] states she has been in contact with [facility] LCSW and is still interested in paperwork for VA benefits if possible.  Rebecca [name] also reports she has been meeting with the palliative medicine team and is hopeful that her mother will improve. States she is still not sure what the ultimate discharge plan will be. States she has had discussions about [facility] if [name] goes home. Right now, Rebecca [name] states [name] is not ready for discharge.  [facility] Care Management written consent obtained for potential ongoing services pending [name] disposition whether with hospice or palliative care services.   Will continue to follow.  Rebecca [name] (daughter) [phone].  Rebecca Rolling, MSN-Ed, RN,BSN [facility] Liaison [phone]

## 2017-05-04 NOTE — Progress Notes (Addendum)
CRITICAL VALUE ALERT  Critical Value:  Lactic acid 2.6  Date & Time Notied:  05/04/17 17:30  Provider Notified: Dr. Nevada Crane  Orders Received/Actions taken: MD aware

## 2017-05-04 NOTE — Consult Note (Signed)
   [facility] CM Inpatient Consult   05/04/2017  Rebecca [name] 18-Jul-1940 [id number]    Provided information to patient's daughter regarding VA benefit questions [facility] LCSW provided.   Daughter, [name] expressed appreciation.   Also reminded [name] that [facility] will not follow if [facility] or hospice was arranged upon discharge. She expressed understanding of this.   Marthenia Rolling, MSN-Ed, RN,BSN [facility] Liaison [phone]

## 2017-05-05 LAB — BASIC METABOLIC PANEL
ANION GAP: 5 (ref 5–15)
BUN: 64 mg/dL — ABNORMAL HIGH (ref 6–20)
CHLORIDE: 109 mmol/L (ref 101–111)
CO2: 26 mmol/L (ref 22–32)
Calcium: 7.2 mg/dL — ABNORMAL LOW (ref 8.9–10.3)
Creatinine, Ser: 1.61 mg/dL — ABNORMAL HIGH (ref 0.44–1.00)
GFR calc non Af Amer: 30 mL/min — ABNORMAL LOW (ref >60)
GFR, EST AFRICAN AMERICAN: 34 mL/min — AB (ref >60)
GLUCOSE: 145 mg/dL — AB (ref 65–99)
POTASSIUM: 4.2 mmol/L (ref 3.5–5.1)
Sodium: 140 mmol/L (ref 135–145)

## 2017-05-05 LAB — CBC
HEMATOCRIT: 25.3 % — AB (ref 36.0–46.0)
HEMOGLOBIN: 8.6 g/dL — AB (ref 12.0–15.0)
MCH: 32.2 pg (ref 26.0–34.0)
MCHC: 34 g/dL (ref 30.0–36.0)
MCV: 94.8 fL (ref 78.0–100.0)
Platelets: 182 10*3/uL (ref 150–400)
RBC: 2.67 MIL/uL — AB (ref 3.87–5.11)
RDW: 14.7 % (ref 11.5–15.5)
WBC: 19.1 10*3/uL — AB (ref 4.0–10.5)

## 2017-05-05 LAB — GLUCOSE, CAPILLARY
GLUCOSE-CAPILLARY: 119 mg/dL — AB (ref 65–99)
GLUCOSE-CAPILLARY: 123 mg/dL — AB (ref 65–99)
GLUCOSE-CAPILLARY: 185 mg/dL — AB (ref 65–99)
GLUCOSE-CAPILLARY: 192 mg/dL — AB (ref 65–99)
GLUCOSE-CAPILLARY: 128 mg/dL — AB (ref 65–99)
Glucose-Capillary: 142 mg/dL — ABNORMAL HIGH (ref 65–99)

## 2017-05-05 MED ORDER — METOPROLOL TARTRATE 25 MG/10 ML ORAL SUSPENSION
50.0000 mg | Freq: Two times a day (BID) | ORAL | Status: DC
  Administered 2017-05-05 – 2017-05-06 (×2): 50 mg via ORAL
  Filled 2017-05-05 (×4): qty 20

## 2017-05-05 MED ORDER — PREDNISONE 10 MG PO TABS
10.0000 mg | ORAL_TABLET | Freq: Every day | ORAL | Status: DC
  Administered 2017-05-05: 10 mg
  Filled 2017-05-05: qty 1

## 2017-05-05 MED ORDER — PRAVASTATIN SODIUM 20 MG PO TABS
20.0000 mg | ORAL_TABLET | Freq: Every day | ORAL | Status: DC
  Administered 2017-05-05 – 2017-05-10 (×6): 20 mg via ORAL
  Filled 2017-05-05 (×6): qty 1

## 2017-05-05 MED ORDER — BLISTEX MEDICATED EX OINT
TOPICAL_OINTMENT | CUTANEOUS | Status: DC | PRN
  Administered 2017-05-06: 23:00:00 via TOPICAL
  Filled 2017-05-05: qty 6.3

## 2017-05-05 MED ORDER — SACCHAROMYCES BOULARDII 250 MG PO CAPS
250.0000 mg | ORAL_CAPSULE | Freq: Two times a day (BID) | ORAL | Status: DC
Start: 2017-05-05 — End: 2017-05-10
  Administered 2017-05-05 – 2017-05-10 (×8): 250 mg via ORAL
  Filled 2017-05-05 (×9): qty 1

## 2017-05-05 MED ORDER — GI COCKTAIL ~~LOC~~
30.0000 mL | Freq: Two times a day (BID) | ORAL | Status: DC | PRN
  Administered 2017-05-06: 30 mL via ORAL
  Filled 2017-05-05 (×2): qty 30

## 2017-05-05 MED ORDER — PREDNISONE 10 MG PO TABS
10.0000 mg | ORAL_TABLET | Freq: Every day | ORAL | Status: DC
  Administered 2017-05-06 – 2017-05-11 (×6): 10 mg via ORAL
  Filled 2017-05-05 (×6): qty 1

## 2017-05-05 MED ORDER — LEVOTHYROXINE SODIUM 75 MCG PO TABS
75.0000 ug | ORAL_TABLET | Freq: Every day | ORAL | Status: DC
  Administered 2017-05-06 – 2017-05-11 (×6): 75 ug via ORAL
  Filled 2017-05-05 (×8): qty 1

## 2017-05-05 MED ORDER — SENNOSIDES-DOCUSATE SODIUM 8.6-50 MG PO TABS
2.0000 | ORAL_TABLET | Freq: Two times a day (BID) | ORAL | Status: DC
  Administered 2017-05-05 – 2017-05-10 (×9): 2 via ORAL
  Filled 2017-05-05 (×11): qty 2

## 2017-05-05 MED ORDER — LIP MEDEX EX OINT
TOPICAL_OINTMENT | CUTANEOUS | Status: DC | PRN
Start: 2017-05-05 — End: 2017-05-05
  Filled 2017-05-05: qty 7

## 2017-05-05 MED ORDER — GUAIFENESIN 200 MG PO TABS
400.0000 mg | ORAL_TABLET | Freq: Three times a day (TID) | ORAL | Status: DC
  Administered 2017-05-05 – 2017-05-11 (×16): 400 mg via ORAL
  Filled 2017-05-05 (×19): qty 2

## 2017-05-05 MED ORDER — FLUCONAZOLE 100 MG PO TABS
200.0000 mg | ORAL_TABLET | Freq: Every day | ORAL | Status: AC
  Administered 2017-05-05 – 2017-05-11 (×7): 200 mg via ORAL
  Filled 2017-05-05 (×6): qty 2

## 2017-05-05 MED ORDER — TRAMADOL HCL 50 MG PO TABS
50.0000 mg | ORAL_TABLET | Freq: Four times a day (QID) | ORAL | Status: DC | PRN
  Administered 2017-05-06 – 2017-05-11 (×8): 50 mg via ORAL
  Filled 2017-05-05 (×9): qty 1

## 2017-05-05 MED ORDER — POLYETHYLENE GLYCOL 3350 17 G PO PACK
17.0000 g | PACK | Freq: Two times a day (BID) | ORAL | Status: DC
  Administered 2017-05-05 – 2017-05-10 (×7): 17 g via ORAL
  Filled 2017-05-05 (×11): qty 1

## 2017-05-05 MED ORDER — ISOSORBIDE MONONITRATE 20 MG PO TABS
20.0000 mg | ORAL_TABLET | Freq: Two times a day (BID) | ORAL | Status: DC
  Administered 2017-05-05: 20 mg
  Filled 2017-05-05 (×2): qty 1

## 2017-05-05 MED ORDER — PANTOPRAZOLE SODIUM 40 MG PO PACK
40.0000 mg | PACK | Freq: Every day | ORAL | Status: DC
  Administered 2017-05-06: 40 mg via ORAL
  Filled 2017-05-05 (×2): qty 20

## 2017-05-05 MED ORDER — ISOSORBIDE MONONITRATE 20 MG PO TABS
20.0000 mg | ORAL_TABLET | Freq: Two times a day (BID) | ORAL | Status: DC
  Administered 2017-05-05 – 2017-05-11 (×10): 20 mg via ORAL
  Filled 2017-05-05 (×16): qty 1

## 2017-05-05 NOTE — Progress Notes (Signed)
Daily Progress Note   Patient Name: Rebecca [name]       Date: 05/05/2017 DOB: 07-08-1940  Age: 76 y.o. MRN#: [id number] Attending Physician: Patrecia Pour, Christean Grief, MD Primary Care Physician: [name], MD Admit Date: 04/24/2017  Reason for Consultation/Follow-up: Establishing goals of care  Subjective: [name] is very sleepy today. Will arouse but falls back asleep.   Length of Stay: 11  Current Medications: Scheduled Meds:  . chlorhexidine  15 mL Mouth Rinse BID  . clopidogrel  75 mg Per Tube QHS  . clotrimazole   Topical BID  . cloZAPine  300 mg Per Tube QHS  . colchicine  0.3 mg Per Tube QHS  . diltiazem  60 mg Per Tube Q6H  . feeding supplement (NEPRO CARB STEADY)  1,000 mL Per Tube Q24H  . guaiFENesin  400 mg Per Tube TID  . heparin  5,000 Units Subcutaneous Q8H  . ipratropium-albuterol  3 mL Inhalation TID  . isosorbide mononitrate  20 mg Per Tube BID  . levothyroxine  75 mcg Per Tube QAC breakfast  . mouth rinse  15 mL Mouth Rinse q12n4p  . metoprolol tartrate  50 mg Per Tube BID  . mometasone-formoterol  2 puff Inhalation BID  . pantoprazole sodium  40 mg Per Tube Daily  . polyethylene glycol  17 g Per Tube BID  . pravastatin  20 mg Per Tube QHS  . predniSONE  10 mg Per Tube Q breakfast  . saccharomyces boulardii  250 mg Per Tube BID  . senna-docusate  2 tablet Per Tube BID  . tamsulosin  0.4 mg Oral Q breakfast    Continuous Infusions:   PRN Meds: acetaminophen **OR** acetaminophen, fluticasone, gi cocktail, ondansetron **OR** ondansetron (ZOFRAN) IV, RESOURCE THICKENUP CLEAR, traMADol  Physical Exam         Constitutional: She is oriented to person, place, and time. She appears well-developed. She appears lethargic. She appears ill.  Frail, elderly    HENT:  Head: Normocephalic and atraumatic.  Cardiovascular: Normal rate and regular rhythm.  Pulmonary/Chest: Effort normal. No accessory muscle usage. No tachypnea. No respiratory distress.  Abdominal: Soft. Normal appearance.  Neurological: She appears lethargic.  Skin:  Extensive bruising to bilat arms (chronic steroid use + anticoagulation). Skin thin and frail.   Nursing note and vitals reviewed.  Vital Signs: BP (!) 120/37   Pulse 90   Temp 99.1 F (37.3 C) (Oral)   Resp 17   Ht _0  (1.6 m)   Wt 66.4 kg (146 lb 6.2 oz)   SpO2 95%   BMI 25.93 kg/m  SpO2: SpO2: 95 % O2 Device: O2 Device: Not Delivered O2 Flow Rate: O2 Flow Rate (L/min): 3 L/min  Intake/output summary:   Intake/Output Summary (Last 24 hours) at 05/05/2017 1350 Last data filed at 05/05/2017 0300 Gross per 24 hour  Intake 666.75 ml  Output 550 ml  Net 116.75 ml   LBM: Last BM Date: 05/03/17 Baseline Weight: Weight: 59.9 kg (132 lb) Most recent weight: Weight: 66.4 kg (146 lb 6.2 oz)       Palliative Assessment/Data: 30%   Flowsheet Rows     Most Recent Value  Intake Tab  Referral Department  Hospitalist  Unit at Time of Referral  Intermediate Care Unit  Palliative Care Primary Diagnosis  Neurology  Date Notified  04/29/17  Palliative Care Type  Return patient Palliative Care  Reason for referral  Clarify Goals of Care  Date of Admission  04/24/17  Date first seen by Palliative Care  04/29/17  # of days Palliative referral response time  0 Day(s)  # of days IP prior to Palliative referral  5  Clinical Assessment  Psychosocial & Spiritual Assessment  Palliative Care Outcomes      Patient Active Problem List   Diagnosis Date Noted  . Dysphagia 04/27/2017  . Pulmonary infiltrates c/w recurrent aspiration pneumonitis vs hcap 03/30/2017  . Prerenal azotemia 03/30/2017  . Dyspnea on exertion 03/29/2017  . Weakness   . Hypervolemia   . Lethargy   . Pain of right hip joint   .  Acute right hip pain 03/14/2017  . Physical debility 03/08/2017  . Recurrent UTI 03/08/2017  . Ascites 03/08/2017  . Chronic pain disorder 03/07/2017  . Chronic constipation   . Community acquired pneumonia   . Palliative care encounter   . Encounter for hospice care discussion   . TIA (transient ischemic attack) 01/27/2017  . Pancreatic mass 01/27/2017  . Aortic atherosclerosis ([place]) 01/27/2017  . Hyponatremia 01/25/2017  . Generalized weakness 01/25/2017  . Fatigue 01/11/2017  . Mixed hyperlipidemia 12/02/2016  . Encephalopathy 11/20/2016  . Altered mental status 11/19/2016  . Aspiration pneumonia of both lower lobes due to gastric secretions ([place]) 10/10/2016  . Aspiration pneumonia ([place]) 10/07/2016  . Lobar pneumonia, unspecified organism ([place]) 10/07/2016  . Ileus ([place]) 10/07/2016  . AKI (acute kidney injury) ([place]) 09/12/2016  . HAP (hospital-acquired pneumonia) 09/12/2016  . Respiratory distress 09/12/2016  . Pressure injury of skin 07/15/2016  . Essential hypertension 07/14/2016  . Acute encephalopathy 07/14/2016  . Coronary artery disease involving native coronary artery of native heart without angina pectoris 05/14/2016  . CKD stage 4, GFR 15-29 ml/min  08/11/2015  . 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 Leukocytosis 01/28/2014  . History of TIA - May 2015- Plavix 10/16/2013  . Polymyalgia rheumatica ([place]) 10/03/2012  . Chronic diastolic (congestive) heart failure ([place]) 07/15/2012  . Trochanteric bursitis of right hip 04/11/2012  . Dysuria 08/30/2011  . History of small bowel obstruction 11/04/2010  . Schizoaffective disorder ([place]) 01/10/2008  . Dyslipidemia 01/25/2007  . Hypothyroidism 08/11/2006  . Anemia in chronic renal disease 08/11/2006  . GASTROESOPHAGEAL REFLUX, NO ESOPHAGITIS 08/11/2006    Palliative Care Assessment & Plan   HPI: 76 y.o. female  with past  medical history of COPD, PMR on chronic prednisone, CAD, diastolic  CHF, CKD stage III, MGUS, recent admission to [facility] with right-sided trochanteric bursitis and went to [facility] for rehab but had returned home recently with daughter. Was admitted on 04/24/2017 with lethargy and fever r/t UTI and aspiration pneumonia. Has struggled with worsening dysphagia and have proceeded with temporary Cortrak placement to allow for rest for esophagitis and SLP will reassess swallow function after the weekend. MBS scheduled for 1300 05/03/17.   Assessment: I met again today with Ms. Menzer and [place] at bedside. Ms. Wellons sleeping more of my visit. [name] is very concerned about infection since antibiotics d/c. She also has paperwork from PCP with baseline labs and values. Hopefully can check thyroid panel with am labs.   [name] has come a long way. She is trying to prepare herself to focus on comfort and prepare for her mother's passing BUT wants to make sure we are doing everything to give her the best chance possible to improve. She recognizes her mother is tired and weak. Spent much time discussing with [name] and providing support to her. We have also discussed palliative and hospice services depending on outcomes. Will continue to follow.   Recommendations/Plan:  Continue Cortrak for now.   MBS done -  Dys I, honey thick fluids. Intake poor.   Code Status:  DNR  Prognosis:   TBD but overall very poor with severe deconditioning and concern with aspiration risk and ability to meet adequate nutritional needs when feeding tube D/C.   Discharge Planning:  To Be Determined   Thank you for allowing the Palliative Medicine Team to assist in the care of this patient.   Total Time 50 min Prolonged Time Billed  no       Greater than 50%  of this time was spent counseling and coordinating care related to the above assessment and plan.  Vinie Sill, NP Palliative Medicine Team Pager # [phone] (M-F 8a-5p) Team Phone # [phone] (Nights/Weekends)

## 2017-05-05 NOTE — Progress Notes (Signed)
Pt's BP is 120/37. MD Quincy Simmonds made aware. Will continue to assess.

## 2017-05-05 NOTE — Progress Notes (Addendum)
PROGRESS NOTE [facility]   Rebecca [name]   UKG:[id number] DOB: 06-04-1941  DOA: 04/24/2017 PCP: [name], MD   Brief Narrative:  Rebecca [name] is a 76 year old female with PMH of COPD, not on home oxygen, PMR on chronic prednisone 10 MG daily, CAD status post DES to RCA 2006, stage III CKD, chronic diastolic CHF, MGUS, hypothyroid, recent hospitalization for right trochanteric bursitis, acute on chronic diastolic CHF and HCAP, discharged to SNF and subsequently home, treated for presumed UTI with Ceftin at discharge from SNF, presented with lethargy, fever, cough productive of yellow sputum and vomiting. Admitted for LLL pneumonia suspicious for aspiration pneumonia. Failed swallow evaluation on 11/14. Concern for esophageal dysmotility, [facility] consulted and workup completed. diet per speech therapy ordered but very poor oral intake and at risk for aspiration. Therefore placed cor track for temporary tube feeding on 11/16. Speech therapyfollowing the patient, now recommending advance diet.   Subjective: Patient seen and examined continue to improve, NGT bothering her and causing some pain on her nose.   Assessment & Plan: Sepsis secondary to aspiration pneumonia Patient complete multiple abx therapy, pro-calcitonin negative, infectious process seems to be resolved    Acute respiratory failure with hypoxia due to aspiration pneumonia -resolved CXR from yesterday ? Pleural effusion vs infiltrate vs atelectasis, patient is off oxygen, will continue to monitor. Flutter valve  See above  Dysphagia This is a chronic issue, last MBS recommending dysphagia 1 with honey thick liquids  Patient with candida on her tongue, she is at risk for esophagea candidiasis, will treat empirically with Diflucan 200 mg daily for 7 days. Patient may need EGD but family opted for conservative treatment for now. GI was consulted and has signed off   Chronic leukocytosis Labs done by PCP on October  shows baseline white count around 14 WBC trending down Continue to monitor off antibiotics, calcitonin negative. Steroids probably contributing to elevated white count  Chronic kidney disease stage III Cr trending down from admission, although not at baseline  She seem to be slight dry today, encourage oral intake, hold on IVF at this time   PMR on chronic prednisone Stable   Hypertension BP soft, continue to monitor for now  Continue current regimen   Hypothyroidism  Check TSH and free T4  Continue Synthroid for now   Chronic hip pain - OP rheumatology felt to be pseudogout versus trochanteric bursitis patient is on steroids and colchicine.  DVT prophylaxis: Heparin sq Code Status: Full code Family Communication: Daughter at bedside Disposition Plan: Pending physical therapy evaluation SNF versus CIR  Consultants:   GI - [facility]  Procedures:   None  Antimicrobials: Anti-infectives (From admission, onward)   Start     Dose/Rate Route Frequency Ordered Stop   05/05/17 1645  fluconazole (DIFLUCAN) tablet 200 mg     200 mg Oral Daily 05/05/17 1634 05/12/17 0959   05/02/17 2200  meropenem (MERREM) 500 mg in sodium chloride 0.9 % 50 mL IVPB  Status:  Discontinued     500 mg 100 mL/hr over 30 Minutes Intravenous Every 12 hours 05/02/17 1053 05/03/17 1045   04/29/17 2200  meropenem (MERREM) 1 g in sodium chloride 0.9 % 100 mL IVPB  Status:  Discontinued     1 g 200 mL/hr over 30 Minutes Intravenous Every 12 hours 04/29/17 1208 05/02/17 1053   04/26/17 1500  meropenem (MERREM) 1 g in sodium chloride 0.9 % 100 mL IVPB  Status:  Discontinued  1 g 200 mL/hr over 30 Minutes Intravenous Every 24 hours 04/26/17 1349 04/29/17 1209   04/26/17 1230  vancomycin (VANCOCIN) IVPB 1000 mg/200 mL premix  Status:  Discontinued     1,000 mg 200 mL/hr over 60 Minutes Intravenous Every 48 hours 04/25/17 1225 04/28/17 1233   04/26/17 0600  piperacillin-tazobactam (ZOSYN) IVPB 2.25 g  Status:   Discontinued     2.25 g 100 mL/hr over 30 Minutes Intravenous Every 8 hours 04/25/17 2223 04/26/17 1327   04/25/17 1500  vancomycin (VANCOCIN) 500 mg in sodium chloride 0.9 % 100 mL IVPB  Status:  Discontinued     500 mg 100 mL/hr over 60 Minutes Intravenous Every 24 hours 04/24/17 1700 04/25/17 1225   04/25/17 1500  ceFEPIme (MAXIPIME) 1 g in dextrose 5 % 50 mL IVPB  Status:  Discontinued     1 g 100 mL/hr over 30 Minutes Intravenous Every 24 hours 04/24/17 1700 04/24/17 2225   04/24/17 2330  piperacillin-tazobactam (ZOSYN) IVPB 3.375 g  Status:  Discontinued     3.375 g 100 mL/hr over 30 Minutes Intravenous Every 8 hours 04/24/17 2225 04/25/17 2223   04/24/17 1445  ceFEPIme (MAXIPIME) 2 g in dextrose 5 % 50 mL IVPB     2 g 100 mL/hr over 30 Minutes Intravenous  Once 04/24/17 1432 04/24/17 1609   04/24/17 1445  vancomycin (VANCOCIN) IVPB 1000 mg/200 mL premix     1,000 mg 200 mL/hr over 60 Minutes Intravenous  Once 04/24/17 1432 04/24/17 1616          Objective: Vitals:   05/05/17 0807 05/05/17 0811 05/05/17 0904 05/05/17 1451  BP:   (!) 120/37   Pulse:   90   Resp:      Temp:      TempSrc:      SpO2: 95% 95%  95%  Weight:      Height:        Intake/Output Summary (Last 24 hours) at 05/05/2017 1630 Last data filed at 05/05/2017 0300 Gross per 24 hour  Intake 666.75 ml  Output 550 ml  Net 116.75 ml   Filed Weights   05/01/17 2126 05/04/17 0540 05/05/17 0400  Weight: 66.8 kg (147 lb 4.3 oz) 67.2 kg (148 lb 2.4 oz) 66.4 kg (146 lb 6.2 oz)    Examination:  General exam: NAD, NGT not well positioned  HEENT: OP white plaque that scrape with tongue depressor, dry lips with mild ulcer and cheilosis  Respiratory system: Clear to auscultation. No wheezes,crackle or rhonchi Cardiovascular system: S1 & S2 heard, RRR.  Gastrointestinal system: Abdomen is nondistended, soft and nontender. Normal bowel sounds heard. Central nervous system: Alert and oriented. Extremities:  Trace pedal edema, bilaterally  Skin: No rashes, multiple ecchymosis  Psychiatry: Mood & affect appropriate.    Data Reviewed: I have personally reviewed following labs and imaging studies  CBC: Recent Labs  Lab 04/29/17 0220 04/30/17 0233 05/02/17 0222 05/04/17 0823 05/05/17 0210  WBC 17.9* 14.6* 17.5* 20.1* 19.1*  NEUTROABS  --   --  15.6*  --   --   HGB 8.5* 7.9* 8.8* 8.9* 8.6*  HCT 25.4* 23.7* 27.0* 26.8* 25.3*  MCV 90.1 92.2 93.8 94.7 94.8  PLT 185 183 184 182 109   Basic Metabolic Panel: Recent Labs  Lab 04/29/17 0220 04/30/17 0233 05/02/17 0222 05/04/17 0823 05/05/17 0210  NA 144 144 145 139 140  K 4.1 4.0 4.5 4.5 4.2  CL 114* 115* 115* 109 109  CO2  21* 22 24 24 26   GLUCOSE 117* 155* 144* 133* 145*  BUN 60* 56* 71* 64* 64*  CREATININE 1.85* 1.75* 1.74* 1.54* 1.61*  CALCIUM 8.3* 8.0* 7.9* 7.2* 7.2*   GFR: Estimated Creatinine Clearance: 26.4 mL/min (A) (by C-G formula based on SCr of 1.61 mg/dL (H)). Liver Function Tests: Recent Labs  Lab 05/02/17 0222  AST 19  ALT 24  ALKPHOS 31*  BILITOT 0.5  PROT 4.6*  ALBUMIN 2.3*   No results for input(s): LIPASE, AMYLASE in the last 168 hours. No results for input(s): AMMONIA in the last 168 hours. Coagulation Profile: No results for input(s): INR, PROTIME in the last 168 hours. Cardiac Enzymes: No results for input(s): CKTOTAL, [name], [name], TROPONINI in the last 168 hours. BNP (last 3 results) Recent Labs    03/29/17 1646  PROBNP 84.0   HbA1C: No results for input(s): HGBA1C in the last 72 hours. CBG: Recent Labs  Lab 05/04/17 2041 05/05/17 0012 05/05/17 0402 05/05/17 0808 05/05/17 1157  GLUCAP 133* 142* 119* 123* 185*   Lipid Profile: No results for input(s): CHOL, [name], [name], [name], [name], LDLDIRECT in the last 72 hours. Thyroid Function Tests: No results for input(s): TSH, T4TOTAL, FREET4, T3FREE, THYROIDAB in the last 72 hours. Anemia Panel: No results for input(s): VITAMINB12,  FOLATE, FERRITIN, TIBC, IRON, RETICCTPCT in the last 72 hours. Sepsis Labs: Recent Labs  Lab 05/04/17 1642  PROCALCITON <0.10  LATICACIDVEN 2.6*    No results found for this or any previous visit (from the past 240 hour(s)).    Radiology Studies: Dg Chest Port 1 View  Result Date: 05/04/2017 CLINICAL DATA:  Hypoxia. EXAM: PORTABLE CHEST 1 VIEW COMPARISON:  04/24/2017 FINDINGS: Patient is partially rotated to the left. Heart size is stable. Aortic atherosclerosis. Confluent left lower lung opacity is mildly increased since previous study, and may be due to increased pleural effusion and/or pulmonary infiltrate or atelectasis. Right lung remains clear. No evidence of pneumothorax. A nasogastric tube is seen with tip overlying the distal thoracic esophagus, just above the GE junction. IMPRESSION: Increased inflow left lower lung opacity, which may be due to increased pleural effusion, pulmonary infiltrate, and/or atelectasis. Nasogastric tube tip overlies the distal esophagus, just above the GE junction. Electronically Signed   By: Earle Gell M.D.   On: 05/04/2017 20:30    Scheduled Meds: . chlorhexidine  15 mL Mouth Rinse BID  . clopidogrel  75 mg Per Tube QHS  . clotrimazole   Topical BID  . cloZAPine  300 mg Per Tube QHS  . colchicine  0.3 mg Per Tube QHS  . diltiazem  60 mg Per Tube Q6H  . feeding supplement (NEPRO CARB STEADY)  1,000 mL Per Tube Q24H  . guaiFENesin  400 mg Per Tube TID  . heparin  5,000 Units Subcutaneous Q8H  . ipratropium-albuterol  3 mL Inhalation TID  . isosorbide mononitrate  20 mg Per Tube BID  . levothyroxine  75 mcg Per Tube QAC breakfast  . mouth rinse  15 mL Mouth Rinse q12n4p  . metoprolol tartrate  50 mg Per Tube BID  . mometasone-formoterol  2 puff Inhalation BID  . pantoprazole sodium  40 mg Per Tube Daily  . polyethylene glycol  17 g Per Tube BID  . pravastatin  20 mg Per Tube QHS  . predniSONE  10 mg Per Tube Q breakfast  . saccharomyces  boulardii  250 mg Per Tube BID  . senna-docusate  2 tablet Per Tube BID  .  tamsulosin  0.4 mg Oral Q breakfast   Continuous Infusions:   LOS: 11 days    Time spent: Total of 35 minutes spent with pt, greater than 50% of which was spent in discussion of  treatment, counseling and coordination of care   Chipper Oman, MD Pager: Text Page via [email or web address]   If 7PM-7AM, please contact night-coverage [email or web address] 05/05/2017, 4:30 PM

## 2017-05-06 ENCOUNTER — Inpatient Hospital Stay (HOSPITAL_COMMUNITY): Payer: Medicare Other

## 2017-05-06 DIAGNOSIS — K209 Esophagitis, unspecified without bleeding: Secondary | ICD-10-CM

## 2017-05-06 DIAGNOSIS — R638 Other symptoms and signs concerning food and fluid intake: Secondary | ICD-10-CM

## 2017-05-06 LAB — CBC WITH DIFFERENTIAL/PLATELET
Basophils Absolute: 0 10*3/uL (ref 0.0–0.1)
Basophils Relative: 0 %
EOS PCT: 1 %
Eosinophils Absolute: 0.1 10*3/uL (ref 0.0–0.7)
HCT: 26.4 % — ABNORMAL LOW (ref 36.0–46.0)
Hemoglobin: 8.6 g/dL — ABNORMAL LOW (ref 12.0–15.0)
LYMPHS ABS: 1.3 10*3/uL (ref 0.7–4.0)
LYMPHS PCT: 6 %
MCH: 31.3 pg (ref 26.0–34.0)
MCHC: 32.6 g/dL (ref 30.0–36.0)
MCV: 96 fL (ref 78.0–100.0)
MONO ABS: 0.8 10*3/uL (ref 0.1–1.0)
Monocytes Relative: 4 %
Neutro Abs: 18.5 10*3/uL — ABNORMAL HIGH (ref 1.7–7.7)
Neutrophils Relative %: 89 %
PLATELETS: 191 10*3/uL (ref 150–400)
RBC: 2.75 MIL/uL — AB (ref 3.87–5.11)
RDW: 15.2 % (ref 11.5–15.5)
WBC: 20.6 10*3/uL — AB (ref 4.0–10.5)

## 2017-05-06 LAB — BASIC METABOLIC PANEL
Anion gap: 3 — ABNORMAL LOW (ref 5–15)
BUN: 59 mg/dL — ABNORMAL HIGH (ref 6–20)
CO2: 29 mmol/L (ref 22–32)
Calcium: 7.5 mg/dL — ABNORMAL LOW (ref 8.9–10.3)
Chloride: 110 mmol/L (ref 101–111)
Creatinine, Ser: 1.57 mg/dL — ABNORMAL HIGH (ref 0.44–1.00)
GFR calc Af Amer: 35 mL/min — ABNORMAL LOW (ref >60)
GFR, EST NON AFRICAN AMERICAN: 30 mL/min — AB (ref >60)
GLUCOSE: 132 mg/dL — AB (ref 65–99)
POTASSIUM: 4.7 mmol/L (ref 3.5–5.1)
Sodium: 142 mmol/L (ref 135–145)

## 2017-05-06 LAB — GLUCOSE, CAPILLARY
GLUCOSE-CAPILLARY: 133 mg/dL — AB (ref 65–99)
GLUCOSE-CAPILLARY: 151 mg/dL — AB (ref 65–99)
Glucose-Capillary: 107 mg/dL — ABNORMAL HIGH (ref 65–99)
Glucose-Capillary: 191 mg/dL — ABNORMAL HIGH (ref 65–99)
Glucose-Capillary: 89 mg/dL (ref 65–99)
Glucose-Capillary: 93 mg/dL (ref 65–99)

## 2017-05-06 LAB — T4, FREE: Free T4: 0.82 ng/dL (ref 0.61–1.12)

## 2017-05-06 LAB — TSH: TSH: 2.185 u[IU]/mL (ref 0.350–4.500)

## 2017-05-06 MED ORDER — DILTIAZEM 12 MG/ML ORAL SUSPENSION
60.0000 mg | Freq: Four times a day (QID) | ORAL | Status: DC
  Filled 2017-05-06: qty 6

## 2017-05-06 MED ORDER — CLOPIDOGREL BISULFATE 75 MG PO TABS
75.0000 mg | ORAL_TABLET | Freq: Every day | ORAL | Status: DC
  Administered 2017-05-06 – 2017-05-10 (×5): 75 mg via ORAL
  Filled 2017-05-06 (×5): qty 1

## 2017-05-06 MED ORDER — COLCHICINE 0.6 MG PO TABS
0.3000 mg | ORAL_TABLET | Freq: Every day | ORAL | Status: DC
  Administered 2017-05-06 – 2017-05-10 (×5): 0.3 mg via ORAL
  Filled 2017-05-06 (×5): qty 1

## 2017-05-06 MED ORDER — DILTIAZEM HCL ER COATED BEADS 120 MG PO CP24
240.0000 mg | ORAL_CAPSULE | Freq: Every day | ORAL | Status: DC
  Administered 2017-05-06 – 2017-05-11 (×6): 240 mg via ORAL
  Filled 2017-05-06 (×6): qty 1

## 2017-05-06 MED ORDER — DEXTROSE-NACL 5-0.45 % IV SOLN
INTRAVENOUS | Status: DC
  Administered 2017-05-06: 18:00:00 via INTRAVENOUS

## 2017-05-06 MED ORDER — MUPIROCIN 2 % EX OINT
TOPICAL_OINTMENT | Freq: Two times a day (BID) | CUTANEOUS | Status: DC
Start: 2017-05-06 — End: 2017-05-07
  Administered 2017-05-07: 15:00:00 via NASAL
  Filled 2017-05-06: qty 22

## 2017-05-06 MED ORDER — CLOZAPINE 100 MG PO TABS
300.0000 mg | ORAL_TABLET | Freq: Every day | ORAL | Status: DC
  Administered 2017-05-06 – 2017-05-10 (×5): 300 mg via ORAL
  Filled 2017-05-06 (×6): qty 3

## 2017-05-06 MED ORDER — SUCRALFATE 1 GM/10ML PO SUSP
1.0000 g | Freq: Three times a day (TID) | ORAL | Status: DC
  Administered 2017-05-06 – 2017-05-11 (×18): 1 g via ORAL
  Filled 2017-05-06 (×19): qty 10

## 2017-05-06 MED ORDER — ACYCLOVIR 800 MG PO TABS
1000.0000 mg | ORAL_TABLET | Freq: Two times a day (BID) | ORAL | Status: DC
  Administered 2017-05-06 – 2017-05-09 (×6): 1000 mg via ORAL
  Filled 2017-05-06 (×6): qty 0.5

## 2017-05-06 NOTE — Progress Notes (Signed)
Occupational Therapy Treatment Patient Details Name: Rebecca [name] MRN: [id number] DOB: [birth year] Today's Date: 05/06/2017    History of present illness Pt is a 76 y.o. female admitted on 04/24/17 with workup for sepsis secondary to HCAP and acute hypoxemic respiratory failure. Pertinent PMH includes COPD, CAD, HTN, CKD IV, diastolic HF, polymyalgia rheumatica, Chronic hip pain secondary to L5-S1 spondylosis, right trochanteric bursitis (admitted 03/14/17 for hip pain; steroid injection on 03/24/17 with Dr. Ninfa Linden). Of note, recently admitted 10/1-10/13 for HCAP and treated with vanco/zosyn.   OT comments  Pt progressing towards OT goals. Assisted Daughter with head positioning for feeding, and then performed transfer to recliner to increase alertness and so Pt could practice transfers and participate in meaningful activities (coloring). Pt continues to benefit from skilled OT in the acute setting. Pt's daughter really wants to focus on strength/balance for transfers so that she can take her mom home.  Follow Up Recommendations  Home health OT;Supervision/Assistance - 24 hour    Equipment Recommendations  None recommended by OT    Recommendations for Other Services      Precautions / Restrictions Precautions Precautions: Fall Restrictions Weight Bearing Restrictions: No       Mobility Bed Mobility Overal bed mobility: Needs Assistance Bed Mobility: Rolling;Sidelying to Sit Rolling: Min guard Sidelying to sit: Min assist;HOB elevated       General bed mobility comments: min A for trunk elevation (Pt reaching out for hand from therapist) - Pt slightly impulsive sitting on EOB  Transfers Overall transfer level: Needs assistance Equipment used: 2 person hand held assist Transfers: Stand Pivot Transfers;Sit to/from Stand Sit to Stand: Min assist;+2 safety/equipment Stand pivot transfers: Min assist;+2 safety/equipment            Balance Overall balance assessment: Needs  assistance Sitting-balance support: No upper extremity supported Sitting balance-Leahy Scale: Fair Sitting balance - Comments: able to maintain static sitting    Standing balance support: Bilateral upper extremity supported Standing balance-Leahy Scale: Poor Standing balance comment: Min assist to maintain balance along with UE support                           ADL either performed or assessed with clinical judgement   ADL Overall ADL's : Needs assistance/impaired Eating/Feeding: Moderate assistance;With caregiver independent assisting;Sitting Eating/Feeding Details (indicate cue type and reason): Assisted the daughter in positioning of head, and alertness for swallow - Pt requires verbal cues Grooming: Wash/dry face;Brushing hair;Min guard;Sitting Grooming Details (indicate cue type and reason): in recliner                 Toilet Transfer: Minimal assistance;+2 for safety/equipment;Stand-pivot;Comfort height toilet Toilet Transfer Details (indicate cue type and reason): assist for balance and steady and SAFETY Toileting- Clothing Manipulation and Hygiene: Total assistance;+2 for safety/equipment;Sit to/from stand         General ADL Comments: fatigue impacting ability to perform ADL      Vision       Perception     Praxis      Cognition Arousal/Alertness: Lethargic(improved with music and transfer) Behavior During Therapy: WFL for tasks assessed/performed Overall Cognitive Status: History of cognitive impairments - at baseline Area of Impairment: Attention;Memory;Following commands;Safety/judgement;Problem solving                   Current Attention Level: Selective Memory: Decreased short-term memory Following Commands: Follows multi-step commands with increased time Safety/Judgement: Decreased awareness of safety;Decreased awareness of deficits Awareness:  Intellectual Problem Solving: Requires verbal cues          Exercises      Shoulder Instructions       General Comments Pt enjoys Adult coloring books (in room with markers too); Also enjoys making bracelets with beads    Pertinent Vitals/ Pain       Pain Assessment: Faces Faces Pain Scale: Hurts whole lot Pain Location: "bottom" Pain Descriptors / Indicators: Grimacing;Sore;Discomfort;Moaning Pain Intervention(s): Monitored during session;Repositioned;Patient requesting pain meds-RN notified;Other (comment)(5W Network engineer putting in order for [employer])  Home Living                                          Prior Functioning/Environment              Frequency  Min 2X/week        Progress Toward Goals  OT Goals(current goals can now be found in the care plan section)  Progress towards OT goals: Progressing toward goals  Acute Rehab OT Goals Patient Stated Goal: get transfers as independent as possible to go home OT Goal Formulation: With family Time For Goal Achievement: 05/10/17 Potential to Achieve Goals: Good  Plan Discharge plan needs to be updated;Frequency remains appropriate    Co-evaluation                 AM-PAC PT "6 Clicks" Daily Activity     Outcome Measure   Help from another person eating meals?: A Lot Help from another person taking care of personal grooming?: A Lot Help from another person toileting, which includes using toliet, bedpan, or urinal?: A Lot Help from another person bathing (including washing, rinsing, drying)?: A Lot Help from another person to put on and taking off regular upper body clothing?: A Lot Help from another person to put on and taking off regular lower body clothing?: Total 6 Click Score: 11    End of Session Equipment Utilized During Treatment: Gait belt  OT Visit Diagnosis: Other abnormalities of gait and mobility (R26.89);Muscle weakness (generalized) (M62.81);Other symptoms and signs involving cognitive function   Activity Tolerance Patient tolerated treatment  well;Patient limited by lethargy   Patient Left in chair;with call bell/phone within reach;with chair alarm set;with family/visitor present(RN on way with pain meds)   Nurse Communication Patient requests pain meds;Other (comment)([employer] ordered)        Time: 0973-5329 OT Time Calculation (min): 29 min  Charges: OT General Charges $OT Visit: 1 Visit OT Treatments $Self Care/Home Management : 23-37 mins  Hulda Humphrey OTR/L [place] 05/06/2017, 3:34 PM

## 2017-05-06 NOTE — Progress Notes (Signed)
Family at bedside with multiple questions about nutrition, tube feeding, ivf, etc. Will defer to MD. Joslyn Hy, MSN, RN, CMSRN

## 2017-05-06 NOTE — Progress Notes (Signed)
  Speech Language Pathology Treatment: Dysphagia  Patient Details Name: Rebecca [name] MRN: [id number] DOB: March 19, 1941 Today's Date: 05/06/2017 Time: 0932-3557 SLP Time Calculation (min) (ACUTE ONLY): 20 min  Assessment / Plan / Recommendation Clinical Impression  Session for dysphagia with daughter and Palliative care present. Sores on lower lip were not visible 11/20 when SLP saw last. Lips edematous, 3 large blister like sores on lower lip and tongue. Pt continuing to report pain when swallowing (likely combo of pharyngitis/esophagitis and decreased esophageal motility). Consumed approximately 4 bites thickened water and 5-6 bites (magic cup then cream of wheat mixed with magic cup). Delayed throat clear and pharyngeal congestion. Pt's NGT came out yesterday. Palliative and this therapist discussed not replacing with daughter who was in agreement since pt replied "no" to wanting it replaced. Continue puree (D1), honey thick liquids via teaspoon and compensatory strategies. Encourage po's as much as pt able however limiting factors for intake continue: esophageal dysmotility, oral sores, esophagitis and deconditioning with illness.    HPI HPI: Rebecca [name] a 76 y.o.femalewith medical history significant ofCOPD, pna,PMR, CAD, CVA, pna, GERD, pancreatic mass, barium esophagram revealing mild esophageal dysmotility, puree diet at baseline (thin liquids), schizoaffective disorder, CKD stage IV, diastolic heart failure, MGUS, hypothyroidism who was most recently admitted at [facility] long hospital secondary to right-sided trochanteric bursitis, acute on chronic diastolic heart failure and healthcare acquired pneumonia. This admission found to have healthcare associated pna). MBS 09/2016 reveaked suspected primary esophageal deficits, pharyngeal residue, trace penetration thin to cords silent while consuming barium pill. Dys 3, thin recommended. Repeat MBS today for possible recommendation for liquids.         SLP Plan  Continue with current plan of care       Recommendations  Diet recommendations: Dysphagia 1 (puree);Honey-thick liquid Liquids provided via: Teaspoon Medication Administration: Whole meds with puree Supervision: Patient able to self feed;Full supervision/cueing for compensatory strategies;Staff to assist with self feeding Compensations: Minimize environmental distractions;Slow rate;Small sips/bites;Multiple dry swallows after each bite/sip;Clear throat after each swallow;Hard cough after swallow Postural Changes and/or Swallow Maneuvers: Seated upright 90 degrees;Upright 30-60 min after meal                Oral Care Recommendations: Oral care BID Follow up Recommendations: 24 hour supervision/assistance SLP Visit Diagnosis: Dysphagia, pharyngeal phase (R13.13) Plan: Continue with current plan of care       GO                [name] 05/06/2017, 9:54 AM  Orbie Pyo Colvin Caroli.Ed [employer] [phone]

## 2017-05-06 NOTE — Progress Notes (Signed)
Physical Therapy Treatment Patient Details Name: Rebecca [name] MRN: [id number] DOB: [birth year] Today's Date: 05/06/2017    History of Present Illness Pt is a 76 y.o. female admitted on 04/24/17 with workup for sepsis secondary to HCAP and acute hypoxemic respiratory failure. Pertinent PMH includes COPD, CAD, HTN, CKD IV, diastolic HF, polymyalgia rheumatica, Chronic hip pain secondary to L5-S1 spondylosis, right trochanteric bursitis (admitted 03/14/17 for hip pain; steroid injection on 03/24/17 with Dr. Ninfa Linden). Of note, recently admitted 10/1-10/13 for HCAP and treated with vanco/zosyn.    PT Comments    Pt needed encouragement from therapist and daughter to participate.  Emphasized transfer safety standing tolerance/technique and exercise.  Pt unable to take steps yet.   Follow Up Recommendations  SNF;Supervision/Assistance - 24 hour     Equipment Recommendations  Other (comment)(TBA)    Recommendations for Other Services       Precautions / Restrictions Precautions Precautions: Fall Restrictions Weight Bearing Restrictions: No    Mobility  Bed Mobility Overal bed mobility: Needs Assistance Bed Mobility: Rolling;Sidelying to Sit Rolling: Min guard Sidelying to sit: Min assist;HOB elevated       General bed mobility comments: min A for trunk elevation (Pt reaching out for hand from therapist) - Pt slightly impulsive sitting on EOB  Transfers Overall transfer level: Needs assistance Equipment used: 2 person hand held assist;Rolling walker (2 wheeled) Transfers: Sit to/from Stand(x2) Sit to Stand: Min assist;+2 safety/equipment Stand pivot transfers: Min assist;+2 safety/equipment       General transfer comment: vc's for hand placement and technique for sequencing and significant boost, esp on the R side.  Ambulation/Gait             General Gait Details: NT   Stairs            Wheelchair Mobility    Modified Rankin (Stroke Patients Only)        Balance Overall balance assessment: Needs assistance Sitting-balance support: No upper extremity supported Sitting balance-Leahy Scale: Fair Sitting balance - Comments: able to maintain static sitting    Standing balance support: Bilateral upper extremity supported Standing balance-Leahy Scale: Poor Standing balance comment: Min assist to maintain balance along with UE support                            Cognition Arousal/Alertness: Lethargic;Awake/alert Behavior During Therapy: WFL for tasks assessed/performed Overall Cognitive Status: History of cognitive impairments - at baseline Area of Impairment: Attention;Memory;Following commands;Safety/judgement;Problem solving                   Current Attention Level: Selective Memory: Decreased short-term memory Following Commands: Follows multi-step commands with increased time Safety/Judgement: Decreased awareness of deficits Awareness: Intellectual Problem Solving: Requires verbal cues General Comments: pt allowed to awaken and follow direction much better.      Exercises General Exercises - Lower Extremity Long Arc Quad: AROM;Both;10 reps;Seated Heel Slides: AROM;Strengthening;Both;10 reps;Seated(resisted gross extension) Hip Flexion/Marching: PROM;Strengthening;Both;10 reps;Seated;Other (comment)(pt only able to generate low amplitude marching)    General Comments General comments (skin integrity, edema, etc.): SpO2 in 91/92% range      Pertinent Vitals/Pain Pain Assessment: Faces Faces Pain Scale: Hurts even more Pain Location: "bottom" Pain Descriptors / Indicators: Sore Pain Intervention(s): Monitored during session    Home Living                      Prior Function  PT Goals (current goals can now be found in the care plan section) Acute Rehab PT Goals Patient Stated Goal: get transfers as independent as possible to go home Progress towards PT goals: Progressing  toward goals    Frequency    Min 2X/week      PT Plan Current plan remains appropriate    Co-evaluation              AM-PAC PT "6 Clicks" Daily Activity  Outcome Measure  Difficulty turning over in bed (including adjusting bedclothes, sheets and blankets)?: A Little Difficulty moving from lying on back to sitting on the side of the bed? : A Little Difficulty sitting down on and standing up from a chair with arms (e.g., wheelchair, bedside commode, etc,.)?: None Help needed moving to and from a bed to chair (including a wheelchair)?: A Lot Help needed walking in hospital room?: A Little Help needed climbing 3-5 steps with a railing? : A Lot 6 Click Score: 17    End of Session Equipment Utilized During Treatment: Gait belt Activity Tolerance: Patient tolerated treatment well;Patient limited by fatigue Patient left: in chair;with call bell/phone within reach;with family/visitor present Nurse Communication: Other (comment) PT Visit Diagnosis: Other abnormalities of gait and mobility (R26.89);Muscle weakness (generalized) (M62.81)     Time: 5170-0174 PT Time Calculation (min) (ACUTE ONLY): 21 min  Charges:  $Therapeutic Activity: 8-22 mins                    G Codes:     05/17/2017  Donnella Sham, PT [phone]  (pager)        Rebecca [name] 05-17-17, 4:21 PM

## 2017-05-06 NOTE — Progress Notes (Signed)
Daily Progress Note   Patient Name: Rebecca [name]       Date: 05/06/2017 DOB: [birth year]  Age: 76 y.o. MRN#: [id number] Attending Physician: Patrecia Pour, Christean Grief, MD Primary Care Physician: [name], MD Admit Date: 04/24/2017  Reason for Consultation/Follow-up: Establishing goals of care  Subjective: Ms. Bourdeau is still lethargic. Takes a couple bites of food then refuses. Complains of pain/burning with intake.   Length of Stay: 12  Current Medications: Scheduled Meds:  . chlorhexidine  15 mL Mouth Rinse BID  . clopidogrel  75 mg Oral QHS  . clotrimazole   Topical BID  . cloZAPine  300 mg Oral QHS  . colchicine  0.3 mg Oral QHS  . diltiazem  60 mg Oral Q6H  . feeding supplement (NEPRO CARB STEADY)  1,000 mL Per Tube Q24H  . fluconazole  200 mg Oral Daily  . guaiFENesin  400 mg Oral TID  . heparin  5,000 Units Subcutaneous Q8H  . ipratropium-albuterol  3 mL Inhalation TID  . isosorbide mononitrate  20 mg Oral BID  . levothyroxine  75 mcg Oral QAC breakfast  . mouth rinse  15 mL Mouth Rinse q12n4p  . metoprolol tartrate  50 mg Oral BID  . mometasone-formoterol  2 puff Inhalation BID  . pantoprazole sodium  40 mg Oral Daily  . polyethylene glycol  17 g Oral BID  . pravastatin  20 mg Oral QHS  . predniSONE  10 mg Oral Q breakfast  . saccharomyces boulardii  250 mg Oral BID  . senna-docusate  2 tablet Oral BID  . sucralfate  1 g Oral TID WC & HS  . tamsulosin  0.4 mg Oral Q breakfast    Continuous Infusions:   PRN Meds: acetaminophen **OR** acetaminophen, fluticasone, gi cocktail, lip balm, ondansetron **OR** ondansetron (ZOFRAN) IV, RESOURCE THICKENUP CLEAR, traMADol  Physical Exam         Constitutional: She is oriented to person, place, and time. She appears  well-developed. She appears lethargic. She appears ill.  Frail, elderly  HENT:  Head: Normocephalic and atraumatic.  Cardiovascular: Normal rate and regular rhythm.  Pulmonary/Chest: Effort normal. No accessory muscle usage. No tachypnea. No respiratory distress.  Abdominal: Soft. Normal appearance.  Neurological: She appears lethargic but arouses.  Skin:  Extensive bruising to bilat arms (chronic steroid  use + anticoagulation). Skin thin and frail.   Nursing note and vitals reviewed.    Vital Signs: BP (!) 135/57 (BP Location: Right Arm)   Pulse 80   Temp 99.3 F (37.4 C) (Oral)   Resp 20   Ht 5\' 3"  (1.6 m)   Wt 65 kg (143 lb 4.8 oz)   SpO2 96%   BMI 25.38 kg/m  SpO2: SpO2: 96 % O2 Device: O2 Device: Not Delivered O2 Flow Rate: O2 Flow Rate (L/min): 3 L/min  Intake/output summary:  No intake or output data in the 24 hours ending 05/06/17 1014 LBM: Last BM Date: 05/03/17 Baseline Weight: Weight: 59.9 kg (132 lb) Most recent weight: Weight: 65 kg (143 lb 4.8 oz)       Palliative Assessment/Data: 30%   Flowsheet Rows     Most Recent Value  Intake Tab  Referral Department  Hospitalist  Unit at Time of Referral  Intermediate Care Unit  Palliative Care Primary Diagnosis  Neurology  Date Notified  04/29/17  Palliative Care Type  Return patient Palliative Care  Reason for referral  Clarify Goals of Care  Date of Admission  04/24/17  Date first seen by Palliative Care  04/29/17  # of days Palliative referral response time  0 Day(s)  # of days IP prior to Palliative referral  5  Clinical Assessment  Psychosocial & Spiritual Assessment  Palliative Care Outcomes      Patient Active Problem List   Diagnosis Date Noted  . Dysphagia 04/27/2017  . Pulmonary infiltrates c/w recurrent aspiration pneumonitis vs hcap 03/30/2017  . Prerenal azotemia 03/30/2017  . Dyspnea on exertion 03/29/2017  . Weakness   . Hypervolemia   . Lethargy   . Pain of right hip joint   .  Acute right hip pain 03/14/2017  . Physical debility 03/08/2017  . Recurrent UTI 03/08/2017  . Ascites 03/08/2017  . Chronic pain disorder 03/07/2017  . Chronic constipation   . Community acquired pneumonia   . Palliative care encounter   . Encounter for hospice care discussion   . TIA (transient ischemic attack) 01/27/2017  . Pancreatic mass 01/27/2017  . Aortic atherosclerosis ([place]) 01/27/2017  . Hyponatremia 01/25/2017  . Generalized weakness 01/25/2017  . Fatigue 01/11/2017  . Mixed hyperlipidemia 12/02/2016  . Encephalopathy 11/20/2016  . Altered mental status 11/19/2016  . Aspiration pneumonia of both lower lobes due to gastric secretions ([place]) 10/10/2016  . Aspiration pneumonia ([place]) 10/07/2016  . Lobar pneumonia, unspecified organism ([place]) 10/07/2016  . Ileus ([place]) 10/07/2016  . AKI (acute kidney injury) ([place]) 09/12/2016  . HAP (hospital-acquired pneumonia) 09/12/2016  . Respiratory distress 09/12/2016  . Pressure injury of skin 07/15/2016  . Essential hypertension 07/14/2016  . Acute encephalopathy 07/14/2016  . Coronary artery disease involving native coronary artery of native heart without angina pectoris 05/14/2016  . CKD stage 4, GFR 15-29 ml/min  08/11/2015  . 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 Leukocytosis 01/28/2014  . History of TIA - May 2015- Plavix 10/16/2013  . Polymyalgia rheumatica ([place]) 10/03/2012  . Chronic diastolic (congestive) heart failure ([place]) 07/15/2012  . Trochanteric bursitis of right hip 04/11/2012  . Dysuria 08/30/2011  . History of small bowel obstruction 11/04/2010  . Schizoaffective disorder ([place]) 01/10/2008  . Dyslipidemia 01/25/2007  . Hypothyroidism 08/11/2006  . Anemia in chronic renal disease 08/11/2006  . GASTROESOPHAGEAL REFLUX, NO ESOPHAGITIS 08/11/2006    Palliative Care Assessment & Plan   HPI: 76 y.o. female  with past  medical history of COPD, PMR on chronic prednisone, CAD, diastolic  CHF, CKD stage III, MGUS, recent admission to [facility] with right-sided trochanteric bursitis and went to [facility] for rehab but had returned home recently with daughter. Was admitted on 04/24/2017 with lethargy and fever r/t UTI and aspiration pneumonia. Has struggled with worsening dysphagia and have proceeded with temporary Cortrak placement to allow for rest for esophagitis and SLP will reassess swallow function after the weekend. MBS scheduled for 1300 05/03/17.   Assessment: I spoke more with [name] as her mother slept. [name] continues to be tearful and struggling with desire for antibiotics and IVF but also struggling with the fact that her mother's prognosis is poor. [name] says she wants to talk with her mother about what she wants this afternoon. I also told her to consider and talk with her mother about if we have limited time how she wishes to spend this time. I explained that many times people do not wish to spend this time in the hospital hooked up to IVs and that she should consider if having her at home would be an option if she so desired. She has previously had hospice for what we thought was a pancreatic cancer so they are open to this if needed. [name] has a lot on her mind, wants to do what is right for her mother, but also struggling with limiting interventions that prolong life. Emotional support provided.   Recommendations/Plan:  Cortrak out now.   MBS done -  Dys I, honey thick fluids. Intake poor.   Added carafate TID. GI cocktail BID prn. Dr. Patrecia Pour added Diflucan yesterday.   Code Status:  DNR  Prognosis:   TBD but overall very poor with severe deconditioning and concern with aspiration risk and ability to meet adequate nutritional needs when feeding tube D/C. Likely eligible for hospice at home or hospice facility with comfort measures.   Discharge Planning:  To Be Determined   Thank you for allowing the Palliative Medicine Team to assist in the care of this  patient.   Total Time 40 min Prolonged Time Billed  no       Greater than 50%  of this time was spent counseling and coordinating care related to the above assessment and plan.  Vinie Sill, NP Palliative Medicine Team Pager # [phone] (M-F 8a-5p) Team Phone # [phone] (Nights/Weekends)

## 2017-05-06 NOTE — Progress Notes (Signed)
PROGRESS NOTE [facility]   LAAIBAH WARTMAN   PPI:[id number] DOB: [birth year]  DOA: 04/24/2017 PCP: [name], MD   Brief Narrative:  Rebecca [name] is a 76 year old female with PMH of COPD, not on home oxygen, PMR on chronic prednisone 10 MG daily, CAD status post DES to RCA 2006, stage III CKD, chronic diastolic CHF, MGUS, hypothyroid, recent hospitalization for right trochanteric bursitis, acute on chronic diastolic CHF and HCAP, discharged to SNF and subsequently home, treated for presumed UTI with Ceftin at discharge from SNF, presented with lethargy, fever, cough productive of yellow sputum and vomiting. Admitted for LLL pneumonia suspicious for aspiration pneumonia. Failed swallow evaluation on 11/14. Concern for esophageal dysmotility, [facility] consulted and workup completed. diet per speech therapy ordered but very poor oral intake and at risk for aspiration. Therefore placed cor track for temporary tube feeding on 11/16. Speech therapyfollowing the patient, now recommending advance diet.   Subjective: Continues w/o complaints. Per daughter did not eat much this am. Lips with ulcerated lesions. No acute events overnight    Assessment & Plan: Sepsis secondary to aspiration pneumonia Patient complete multiple abx therapy, pro-calcitonin negative, will repeat CXR today as patient continues to have elevated WBC, maybe PNA remains untreated.   Acute respiratory failure with hypoxia due to aspiration pneumonia -resolved CXR from yesterday ? Pleural effusion vs infiltrate vs atelectasis, patient is off oxygen, will continue to monitor. Flutter valve  See above  Dysphagia - oral candidiasis ? Esophagitis  This is a chronic issue, last MBS recommending dysphagia 1 with honey thick liquids  Patient with candida on her tongue, she is at risk for esophagea candidiasis, will treat empirically with Diflucan 200 mg daily for 7 days. Patient may need EGD but family opted for conservative  treatment for now. GI was consulted and has signed off  Will discuss with SLP, to evaluate in the afternoon when patient is more cooperative.  Send CMV panel patient is chronic steroid user.   Chronic leukocytosis Labs done by PCP on October shows baseline white count around 14 WBC remains up, repeat CXR,  Urine cx was treated with Merrem  Continue to monitor off antibiotics, pro-calcitonin negative. Patient on steroids   Ulcerated Lip - ? Herpes  Pt with hx of herpes virus, Will give a trial of Acyclovir  Continue to monitor    UTI  Enterobacter  Treated with Merrem   Chronic kidney disease stage III Cr trending down from admission, although not at baseline  Patient not drinking much will place on gentle hydration  Monitor for signs of fluid overload.    PMR on chronic prednisone Stable   Hypertension BP stable   Continue current regimen   Hypothyroidism  Check TSH and free T4  Continue Synthroid for now   Chronic hip pain - OP rheumatology felt to be pseudogout versus trochanteric bursitis patient is on steroids and colchicine.  DVT prophylaxis: Heparin sq Code Status: Full code Family Communication: Daughter at bedside Disposition Plan: Recommending SNF   Consultants:   GI - [facility]  Procedures:   None  Antimicrobials: Anti-infectives (From admission, onward)   Start     Dose/Rate Route Frequency Ordered Stop   05/05/17 1645  fluconazole (DIFLUCAN) tablet 200 mg     200 mg Oral Daily 05/05/17 1634 05/12/17 0959   05/02/17 2200  meropenem (MERREM) 500 mg in sodium chloride 0.9 % 50 mL IVPB  Status:  Discontinued     500 mg 100 mL/hr over  30 Minutes Intravenous Every 12 hours 05/02/17 1053 05/03/17 1045   04/29/17 2200  meropenem (MERREM) 1 g in sodium chloride 0.9 % 100 mL IVPB  Status:  Discontinued     1 g 200 mL/hr over 30 Minutes Intravenous Every 12 hours 04/29/17 1208 05/02/17 1053   04/26/17 1500  meropenem (MERREM) 1 g in sodium chloride 0.9 %  100 mL IVPB  Status:  Discontinued     1 g 200 mL/hr over 30 Minutes Intravenous Every 24 hours 04/26/17 1349 04/29/17 1209   04/26/17 1230  vancomycin (VANCOCIN) IVPB 1000 mg/200 mL premix  Status:  Discontinued     1,000 mg 200 mL/hr over 60 Minutes Intravenous Every 48 hours 04/25/17 1225 04/28/17 1233   04/26/17 0600  piperacillin-tazobactam (ZOSYN) IVPB 2.25 g  Status:  Discontinued     2.25 g 100 mL/hr over 30 Minutes Intravenous Every 8 hours 04/25/17 2223 04/26/17 1327   04/25/17 1500  vancomycin (VANCOCIN) 500 mg in sodium chloride 0.9 % 100 mL IVPB  Status:  Discontinued     500 mg 100 mL/hr over 60 Minutes Intravenous Every 24 hours 04/24/17 1700 04/25/17 1225   04/25/17 1500  ceFEPIme (MAXIPIME) 1 g in dextrose 5 % 50 mL IVPB  Status:  Discontinued     1 g 100 mL/hr over 30 Minutes Intravenous Every 24 hours 04/24/17 1700 04/24/17 2225   04/24/17 2330  piperacillin-tazobactam (ZOSYN) IVPB 3.375 g  Status:  Discontinued     3.375 g 100 mL/hr over 30 Minutes Intravenous Every 8 hours 04/24/17 2225 04/25/17 2223   04/24/17 1445  ceFEPIme (MAXIPIME) 2 g in dextrose 5 % 50 mL IVPB     2 g 100 mL/hr over 30 Minutes Intravenous  Once 04/24/17 1432 04/24/17 1609   04/24/17 1445  vancomycin (VANCOCIN) IVPB 1000 mg/200 mL premix     1,000 mg 200 mL/hr over 60 Minutes Intravenous  Once 04/24/17 1432 04/24/17 1616         Objective: Vitals:   05/06/17 0551 05/06/17 0712 05/06/17 1157 05/06/17 1454  BP: (!) 135/57   121/74  Pulse: 80   (!) 102  Resp: 20   17  Temp: 99.3 F (37.4 C)  99.6 F (37.6 C) 98.6 F (37 C)  TempSrc: Oral  Oral Oral  SpO2: 96% 96%  93%  Weight:      Height:       No intake or output data in the 24 hours ending 05/06/17 1700 Filed Weights   05/04/17 0540 05/05/17 0400 05/06/17 0459  Weight: 67.2 kg (148 lb 2.4 oz) 66.4 kg (146 lb 6.2 oz) 65 kg (143 lb 4.8 oz)    Examination:  General exam: NAD  HEENT: Ulcerated lip midline,with yellow crust    Respiratory system: CTA no wheezing  Cardiovascular system: S1S2 RRR  Gastrointestinal system: Abd soft NTND  Central nervous system: AAOx3 . Extremities: LE edema improved  Skin: No rashes, multiple ecchymosis  Psychiatry: Mood & affect appropriate.    Data Reviewed: I have personally reviewed following labs and imaging studies  CBC: Recent Labs  Lab 04/30/17 0233 05/02/17 0222 05/04/17 0823 05/05/17 0210 05/06/17 1136  WBC 14.6* 17.5* 20.1* 19.1* 20.6*  NEUTROABS  --  15.6*  --   --  18.5*  HGB 7.9* 8.8* 8.9* 8.6* 8.6*  HCT 23.7* 27.0* 26.8* 25.3* 26.4*  MCV 92.2 93.8 94.7 94.8 96.0  PLT 183 184 182 182 856   Basic Metabolic Panel: Recent Labs  Lab 04/30/17 0233 05/02/17 0222 05/04/17 0823 05/05/17 0210 05/06/17 1136  NA 144 145 139 140 142  K 4.0 4.5 4.5 4.2 4.7  CL 115* 115* 109 109 110  CO2 22 24 24 26 29   GLUCOSE 155* 144* 133* 145* 132*  BUN 56* 71* 64* 64* 59*  CREATININE 1.75* 1.74* 1.54* 1.61* 1.57*  CALCIUM 8.0* 7.9* 7.2* 7.2* 7.5*   GFR: Estimated Creatinine Clearance: 26.8 mL/min (A) (by C-G formula based on SCr of 1.57 mg/dL (H)). Liver Function Tests: Recent Labs  Lab 05/02/17 0222  AST 19  ALT 24  ALKPHOS 31*  BILITOT 0.5  PROT 4.6*  ALBUMIN 2.3*   No results for input(s): LIPASE, AMYLASE in the last 168 hours. No results for input(s): AMMONIA in the last 168 hours. Coagulation Profile: No results for input(s): INR, PROTIME in the last 168 hours. Cardiac Enzymes: No results for input(s): CKTOTAL, [name], [name], TROPONINI in the last 168 hours. BNP (last 3 results) Recent Labs    03/29/17 1646  PROBNP 84.0   HbA1C: No results for input(s): HGBA1C in the last 72 hours. CBG: Recent Labs  Lab 05/05/17 2019 05/06/17 0045 05/06/17 0458 05/06/17 0804 05/06/17 1155  GLUCAP 128* 107* 89 93 133*   Lipid Profile: No results for input(s): CHOL, [name], [name], [name], [name], LDLDIRECT in the last 72 hours. Thyroid Function  Tests: Recent Labs    05/06/17 1136  TSH 2.185  FREET4 0.82   Anemia Panel: No results for input(s): VITAMINB12, FOLATE, FERRITIN, TIBC, IRON, RETICCTPCT in the last 72 hours. Sepsis Labs: Recent Labs  Lab 05/04/17 1642  PROCALCITON <0.10  LATICACIDVEN 2.6*    No results found for this or any previous visit (from the past 240 hour(s)).    Radiology Studies: Dg Chest Port 1 View  Result Date: 05/04/2017 CLINICAL DATA:  Hypoxia. EXAM: PORTABLE CHEST 1 VIEW COMPARISON:  04/24/2017 FINDINGS: Patient is partially rotated to the left. Heart size is stable. Aortic atherosclerosis. Confluent left lower lung opacity is mildly increased since previous study, and may be due to increased pleural effusion and/or pulmonary infiltrate or atelectasis. Right lung remains clear. No evidence of pneumothorax. A nasogastric tube is seen with tip overlying the distal thoracic esophagus, just above the GE junction. IMPRESSION: Increased inflow left lower lung opacity, which may be due to increased pleural effusion, pulmonary infiltrate, and/or atelectasis. Nasogastric tube tip overlies the distal esophagus, just above the GE junction. Electronically Signed   By: Earle Gell M.D.   On: 05/04/2017 20:30    Scheduled Meds: . chlorhexidine  15 mL Mouth Rinse BID  . clopidogrel  75 mg Oral QHS  . clotrimazole   Topical BID  . cloZAPine  300 mg Oral QHS  . colchicine  0.3 mg Oral QHS  . diltiazem  240 mg Oral Daily  . feeding supplement (NEPRO CARB STEADY)  1,000 mL Per Tube Q24H  . fluconazole  200 mg Oral Daily  . guaiFENesin  400 mg Oral TID  . heparin  5,000 Units Subcutaneous Q8H  . ipratropium-albuterol  3 mL Inhalation TID  . isosorbide mononitrate  20 mg Oral BID  . levothyroxine  75 mcg Oral QAC breakfast  . mouth rinse  15 mL Mouth Rinse q12n4p  . metoprolol tartrate  50 mg Oral BID  . mometasone-formoterol  2 puff Inhalation BID  . mupirocin ointment   Nasal BID  . pantoprazole sodium  40  mg Oral Daily  . polyethylene glycol  17 g  Oral BID  . pravastatin  20 mg Oral QHS  . predniSONE  10 mg Oral Q breakfast  . saccharomyces boulardii  250 mg Oral BID  . senna-docusate  2 tablet Oral BID  . sucralfate  1 g Oral TID WC & HS  . tamsulosin  0.4 mg Oral Q breakfast   Continuous Infusions:   LOS: 12 days    Time spent: Total of 25 minutes spent with pt, greater than 50% of which was spent in discussion of  treatment, counseling and coordination of care   Chipper Oman, MD Pager: Text Page via [email or web address]   If 7PM-7AM, please contact night-coverage [email or web address] 05/06/2017, 5:00 PM

## 2017-05-07 LAB — CBC WITH DIFFERENTIAL/PLATELET
BASOS ABS: 0 10*3/uL (ref 0.0–0.1)
Basophils Relative: 0 %
EOS PCT: 1 %
Eosinophils Absolute: 0.1 10*3/uL (ref 0.0–0.7)
HEMATOCRIT: 24 % — AB (ref 36.0–46.0)
Hemoglobin: 7.7 g/dL — ABNORMAL LOW (ref 12.0–15.0)
LYMPHS PCT: 11 %
Lymphs Abs: 1.7 10*3/uL (ref 0.7–4.0)
MCH: 31.2 pg (ref 26.0–34.0)
MCHC: 32.1 g/dL (ref 30.0–36.0)
MCV: 97.2 fL (ref 78.0–100.0)
Monocytes Absolute: 0.7 10*3/uL (ref 0.1–1.0)
Monocytes Relative: 4 %
NEUTROS ABS: 13.1 10*3/uL — AB (ref 1.7–7.7)
NEUTROS PCT: 84 %
PLATELETS: 163 10*3/uL (ref 150–400)
RBC: 2.47 MIL/uL — AB (ref 3.87–5.11)
RDW: 15.4 % (ref 11.5–15.5)
WBC: 15.6 10*3/uL — AB (ref 4.0–10.5)

## 2017-05-07 LAB — COMPREHENSIVE METABOLIC PANEL
ALT: 17 U/L (ref 14–54)
AST: 19 U/L (ref 15–41)
Albumin: 1.9 g/dL — ABNORMAL LOW (ref 3.5–5.0)
Alkaline Phosphatase: 30 U/L — ABNORMAL LOW (ref 38–126)
Anion gap: 4 — ABNORMAL LOW (ref 5–15)
BUN: 55 mg/dL — AB (ref 6–20)
CHLORIDE: 111 mmol/L (ref 101–111)
CO2: 27 mmol/L (ref 22–32)
CREATININE: 1.64 mg/dL — AB (ref 0.44–1.00)
Calcium: 7.5 mg/dL — ABNORMAL LOW (ref 8.9–10.3)
GFR calc Af Amer: 33 mL/min — ABNORMAL LOW (ref >60)
GFR, EST NON AFRICAN AMERICAN: 29 mL/min — AB (ref >60)
Glucose, Bld: 126 mg/dL — ABNORMAL HIGH (ref 65–99)
Potassium: 4.4 mmol/L (ref 3.5–5.1)
Sodium: 142 mmol/L (ref 135–145)
Total Bilirubin: 0.6 mg/dL (ref 0.3–1.2)
Total Protein: 3.8 g/dL — ABNORMAL LOW (ref 6.5–8.1)

## 2017-05-07 LAB — EPSTEIN-BARR VIRUS VCA, IGM

## 2017-05-07 LAB — GLUCOSE, CAPILLARY
GLUCOSE-CAPILLARY: 141 mg/dL — AB (ref 65–99)
Glucose-Capillary: 98 mg/dL (ref 65–99)
Glucose-Capillary: 172 mg/dL — ABNORMAL HIGH (ref 65–99)

## 2017-05-07 LAB — EPSTEIN-BARR VIRUS VCA, IGG: EBV VCA IgG: 171 U/mL — ABNORMAL HIGH (ref 0.0–17.9)

## 2017-05-07 MED ORDER — MUPIROCIN 2 % EX OINT
TOPICAL_OINTMENT | Freq: Two times a day (BID) | CUTANEOUS | Status: DC
  Administered 2017-05-07 – 2017-05-10 (×7): via TOPICAL
  Filled 2017-05-07: qty 22

## 2017-05-07 MED ORDER — SODIUM CHLORIDE 0.9 % IV SOLN
INTRAVENOUS | Status: DC
  Administered 2017-05-08: 01:00:00 via INTRAVENOUS

## 2017-05-07 MED ORDER — PANTOPRAZOLE SODIUM 40 MG PO TBEC
40.0000 mg | DELAYED_RELEASE_TABLET | Freq: Every day | ORAL | Status: DC
  Administered 2017-05-07 – 2017-05-11 (×5): 40 mg via ORAL
  Filled 2017-05-07 (×6): qty 1

## 2017-05-07 MED ORDER — METOPROLOL TARTRATE 50 MG PO TABS
50.0000 mg | ORAL_TABLET | Freq: Two times a day (BID) | ORAL | Status: DC
  Administered 2017-05-08 – 2017-05-11 (×6): 50 mg via ORAL
  Filled 2017-05-07 (×9): qty 1

## 2017-05-07 NOTE — Progress Notes (Deleted)
Dr. Berline Lopes returned call to RN and states to monitor patient and report any changes, but he is not ordering anything for low BP at this time as patient's BP sometimes runs low and patient is asymptomatic.  Dr. Berline Lopes states to call back if BP lowers into the 70's or if patient becomes symptomatic.  RN will continue to monitor patient and contact MD if condition changes.  P.J. Linus Mako, RN

## 2017-05-07 NOTE — Progress Notes (Addendum)
OK to hold Metoprolol and order for NS at [facility] received from Dr. Myna Hidalgo. P.J. Linus Mako, RN

## 2017-05-07 NOTE — Progress Notes (Signed)
RN notified Dr. Myna Hidalgo that BP is 128/46 manually, holding Metoprolol if ok. Also notified MD that daughter wants IV fluids at [facility] if ok, awaiting response. P.J. Linus Mako, RN

## 2017-05-07 NOTE — Progress Notes (Addendum)
PROGRESS NOTE [facility]   [name]   ZHG:[id number] DOB: 27-Mar-1941  DOA: 04/24/2017 PCP: [name], MD   Brief Narrative:  Rebecca [name] is a 76 year old female with PMH of COPD, not on home oxygen, PMR on chronic prednisone 10 MG daily, CAD status post DES to RCA 2006, stage III CKD, chronic diastolic CHF, MGUS, hypothyroid, recent hospitalization for right trochanteric bursitis, acute on chronic diastolic CHF and HCAP, discharged to SNF and subsequently home, treated for presumed UTI with Ceftin at discharge from SNF, presented with lethargy, fever, cough productive of yellow sputum and vomiting. Admitted for LLL pneumonia suspicious for aspiration pneumonia. Failed swallow evaluation on 11/14. Concern for esophageal dysmotility, [facility] consulted and workup completed. diet per speech therapy ordered but very poor oral intake and at risk for aspiration. Therefore placed cor track for temporary tube feeding on 11/16. Speech therapyfollowing the patient, now recommending advance diet.   Subjective: Patient seen and examined, per daughter tolerated her diet well this morning. She is more awake and interactive today. No acute events overnight. CXR shows left lung opacity with pleural effusion.   Assessment & Plan: Sepsis secondary to aspiration pneumonia Patient complete multiple abx therapy, pro-calcitonin negative,  Leukocytosis improved today. Repeat CXR shows persistent consolidation and pleural effusion. Will continue to monitor off abx and patient is clinically improving.   Acute respiratory failure with hypoxia due to aspiration pneumonia -resolved CXR from yesterday ? Pleural effusion vs infiltrate vs atelectasis, patient is off oxygen, will continue to monitor. Flutter valve  See above  Dysphagia - oral candidiasis ? Esophagitis  This is a chronic issue, last MBS recommending dysphagia 1 with honey thick liquids  Patient with candida on her tongue, she is at risk  for esophagea candidiasis, will treat empirically with Diflucan 200 mg daily for 7 days. Patient may need EGD but family opted for conservative treatment for now. GI was consulted and has signed off  Patient will benefit from speech therapy in the PM as this is the time that she is most cooperative.  CMV panel pending.   Chronic leukocytosis - WBC improving  Labs done by PCP on October shows baseline white count around 14 Urine cx was treated with Merrem  Continue to monitor off antibiotics, pro-calcitonin negative. Patient on steroids   Ulcerated Lip - ? Herpes labialis Pt with hx of herpes virus, Continue acyclovir   Continue to monitor    UTI - resolved   Enterobacter  Treated with Merrem   Chronic kidney disease stage III Cr trended down from admission, although has plateau ~ 1.5  IVF where given - will d/c today as patient is drinking well  Monitor for signs of fluid overload.    PMR on chronic prednisone Stable   Hypertension BP stable   Continue current regimen   Hypothyroidism  TSH and T4 normal  Continue current medication   Chronic hip pain - OP rheumatology felt to be pseudogout versus trochanteric bursitis patient is on steroids and colchicine.  DVT prophylaxis: Heparin sq Code Status: DNR  Family Communication: Daughter at bedside Disposition Plan: Recommending SNF   Consultants:   GI - [facility]  Procedures:   None  Antimicrobials: Anti-infectives (From admission, onward)   Start     Dose/Rate Route Frequency Ordered Stop   05/06/17 2200  acyclovir (ZOVIRAX) tablet 1,000 mg     1,000 mg Oral 2 times daily 05/06/17 1723     05/05/17 1645  fluconazole (DIFLUCAN) tablet 200  mg     200 mg Oral Daily 05/05/17 1634 05/12/17 0959   05/02/17 2200  meropenem (MERREM) 500 mg in sodium chloride 0.9 % 50 mL IVPB  Status:  Discontinued     500 mg 100 mL/hr over 30 Minutes Intravenous Every 12 hours 05/02/17 1053 05/03/17 1045   04/29/17 2200  meropenem  (MERREM) 1 g in sodium chloride 0.9 % 100 mL IVPB  Status:  Discontinued     1 g 200 mL/hr over 30 Minutes Intravenous Every 12 hours 04/29/17 1208 05/02/17 1053   04/26/17 1500  meropenem (MERREM) 1 g in sodium chloride 0.9 % 100 mL IVPB  Status:  Discontinued     1 g 200 mL/hr over 30 Minutes Intravenous Every 24 hours 04/26/17 1349 04/29/17 1209   04/26/17 1230  vancomycin (VANCOCIN) IVPB 1000 mg/200 mL premix  Status:  Discontinued     1,000 mg 200 mL/hr over 60 Minutes Intravenous Every 48 hours 04/25/17 1225 04/28/17 1233   04/26/17 0600  piperacillin-tazobactam (ZOSYN) IVPB 2.25 g  Status:  Discontinued     2.25 g 100 mL/hr over 30 Minutes Intravenous Every 8 hours 04/25/17 2223 04/26/17 1327   04/25/17 1500  vancomycin (VANCOCIN) 500 mg in sodium chloride 0.9 % 100 mL IVPB  Status:  Discontinued     500 mg 100 mL/hr over 60 Minutes Intravenous Every 24 hours 04/24/17 1700 04/25/17 1225   04/25/17 1500  ceFEPIme (MAXIPIME) 1 g in dextrose 5 % 50 mL IVPB  Status:  Discontinued     1 g 100 mL/hr over 30 Minutes Intravenous Every 24 hours 04/24/17 1700 04/24/17 2225   04/24/17 2330  piperacillin-tazobactam (ZOSYN) IVPB 3.375 g  Status:  Discontinued     3.375 g 100 mL/hr over 30 Minutes Intravenous Every 8 hours 04/24/17 2225 04/25/17 2223   04/24/17 1445  ceFEPIme (MAXIPIME) 2 g in dextrose 5 % 50 mL IVPB     2 g 100 mL/hr over 30 Minutes Intravenous  Once 04/24/17 1432 04/24/17 1609   04/24/17 1445  vancomycin (VANCOCIN) IVPB 1000 mg/200 mL premix     1,000 mg 200 mL/hr over 60 Minutes Intravenous  Once 04/24/17 1432 04/24/17 1616         Objective: Vitals:   05/07/17 0601 05/07/17 0830 05/07/17 0838 05/07/17 1352  BP: 135/60     Pulse: 87 77  79  Resp: 20 20  18   Temp: 98.2 F (36.8 C)     TempSrc: Oral     SpO2: 95% 94% 94% 94%  Weight:      Height:        Intake/Output Summary (Last 24 hours) at 05/07/2017 1401 Last data filed at 05/06/2017 1800 Gross per 24  hour  Intake 8.25 ml  Output -  Net 8.25 ml   Filed Weights   05/05/17 0400 05/06/17 0459 05/07/17 0500  Weight: 66.4 kg (146 lb 6.2 oz) 65 kg (143 lb 4.8 oz) 66.5 kg (146 lb 9.7 oz)    Examination:  General: NAD  HEENT:  Ulcerated lip with yellow crust  Cardiovascular: RRR, S1/S2  Respiratory: CTA bilaterally, no wheezing, no rhonchi Abdominal: Soft, NT, ND Extremities: no LE edema, multiple ecchymosis   Data Reviewed: I have personally reviewed following labs and imaging studies  CBC: Recent Labs  Lab 05/02/17 0222 05/04/17 0823 05/05/17 0210 05/06/17 1136 05/07/17 0340  WBC 17.5* 20.1* 19.1* 20.6* 15.6*  NEUTROABS 15.6*  --   --  18.5* 13.1*  HGB  8.8* 8.9* 8.6* 8.6* 7.7*  HCT 27.0* 26.8* 25.3* 26.4* 24.0*  MCV 93.8 94.7 94.8 96.0 97.2  PLT 184 182 182 191 902   Basic Metabolic Panel: Recent Labs  Lab 05/02/17 0222 05/04/17 0823 05/05/17 0210 05/06/17 1136 05/07/17 0340  NA 145 139 140 142 142  K 4.5 4.5 4.2 4.7 4.4  CL 115* 109 109 110 111  CO2 24 24 26 29 27   GLUCOSE 144* 133* 145* 132* 126*  BUN 71* 64* 64* 59* 55*  CREATININE 1.74* 1.54* 1.61* 1.57* 1.64*  CALCIUM 7.9* 7.2* 7.2* 7.5* 7.5*   GFR: Estimated Creatinine Clearance: 25.9 mL/min (A) (by C-G formula based on SCr of 1.64 mg/dL (H)). Liver Function Tests: Recent Labs  Lab 05/02/17 0222 05/07/17 0340  AST 19 19  ALT 24 17  ALKPHOS 31* 30*  BILITOT 0.5 0.6  PROT 4.6* 3.8*  ALBUMIN 2.3* 1.9*   No results for input(s): LIPASE, AMYLASE in the last 168 hours. No results for input(s): AMMONIA in the last 168 hours. Coagulation Profile: No results for input(s): INR, PROTIME in the last 168 hours. Cardiac Enzymes: No results for input(s): CKTOTAL, [name], [name], TROPONINI in the last 168 hours. BNP (last 3 results) Recent Labs    03/29/17 1646  PROBNP 84.0   HbA1C: No results for input(s): HGBA1C in the last 72 hours. CBG: Recent Labs  Lab 05/06/17 1155 05/06/17 1742  05/06/17 2150 05/07/17 0135 05/07/17 0748  GLUCAP 133* 191* 151* 141* 98   Lipid Profile: No results for input(s): CHOL, [name], [name], [name], [name], LDLDIRECT in the last 72 hours. Thyroid Function Tests: Recent Labs    05/06/17 1136  TSH 2.185  FREET4 0.82   Anemia Panel: No results for input(s): VITAMINB12, FOLATE, FERRITIN, TIBC, IRON, RETICCTPCT in the last 72 hours. Sepsis Labs: Recent Labs  Lab 05/04/17 1642  PROCALCITON <0.10  LATICACIDVEN 2.6*    No results found for this or any previous visit (from the past 240 hour(s)).    Radiology Studies: Dg Chest Port 1 View  Result Date: 05/06/2017 CLINICAL DATA:  Cough. EXAM: PORTABLE CHEST 1 VIEW COMPARISON:  Radiograph 05/04/2017.  CT 03/21/2017 FINDINGS: Enteric tube is been removed. Elevated left hemidiaphragm with persistent confluent left basilar opacity and possible small pleural effusion. Minimal streaky right basilar atelectasis. Unchanged heart size and mediastinal contours with dense aortic atherosclerosis. Bronchovascular crowding related to lower lung volumes, no convincing pulmonary edema. No pneumothorax. IMPRESSION: Unchanged left opacity from recent exam, likely combination of consolidation and pleural effusion. Electronically Signed   By: Jeb Levering M.D.   On: 05/06/2017 20:19    Scheduled Meds: . acyclovir  1,000 mg Oral BID  . chlorhexidine  15 mL Mouth Rinse BID  . clopidogrel  75 mg Oral QHS  . clotrimazole   Topical BID  . cloZAPine  300 mg Oral QHS  . colchicine  0.3 mg Oral QHS  . diltiazem  240 mg Oral Daily  . feeding supplement (NEPRO CARB STEADY)  1,000 mL Per Tube Q24H  . fluconazole  200 mg Oral Daily  . guaiFENesin  400 mg Oral TID  . heparin  5,000 Units Subcutaneous Q8H  . ipratropium-albuterol  3 mL Inhalation TID  . isosorbide mononitrate  20 mg Oral BID  . levothyroxine  75 mcg Oral QAC breakfast  . mouth rinse  15 mL Mouth Rinse q12n4p  . metoprolol tartrate  50 mg Oral  BID  . mometasone-formoterol  2 puff Inhalation BID  .  mupirocin ointment   Nasal BID  . pantoprazole sodium  40 mg Oral Daily  . polyethylene glycol  17 g Oral BID  . pravastatin  20 mg Oral QHS  . predniSONE  10 mg Oral Q breakfast  . saccharomyces boulardii  250 mg Oral BID  . senna-docusate  2 tablet Oral BID  . sucralfate  1 g Oral TID WC & HS  . tamsulosin  0.4 mg Oral Q breakfast   Continuous Infusions:   LOS: 13 days    Time spent: Total of 25 minutes spent with pt, greater than 50% of which was spent in discussion of  treatment, counseling and coordination of care   Chipper Oman, MD Pager: Text Page via [email or web address]   If 7PM-7AM, please contact night-coverage [email or web address] 05/07/2017, 2:01 PM

## 2017-05-08 LAB — CBC WITH DIFFERENTIAL/PLATELET
BASOS ABS: 0 10*3/uL (ref 0.0–0.1)
BASOS PCT: 0 %
EOS ABS: 0.1 10*3/uL (ref 0.0–0.7)
Eosinophils Relative: 1 %
HEMATOCRIT: 24.2 % — AB (ref 36.0–46.0)
HEMOGLOBIN: 7.8 g/dL — AB (ref 12.0–15.0)
Lymphocytes Relative: 12 %
Lymphs Abs: 1.7 10*3/uL (ref 0.7–4.0)
MCH: 31.5 pg (ref 26.0–34.0)
MCHC: 32.2 g/dL (ref 30.0–36.0)
MCV: 97.6 fL (ref 78.0–100.0)
MONOS PCT: 5 %
Monocytes Absolute: 0.6 10*3/uL (ref 0.1–1.0)
NEUTROS ABS: 11.2 10*3/uL — AB (ref 1.7–7.7)
NEUTROS PCT: 82 %
Platelets: 197 10*3/uL (ref 150–400)
RBC: 2.48 MIL/uL — AB (ref 3.87–5.11)
RDW: 15.7 % — ABNORMAL HIGH (ref 11.5–15.5)
WBC: 13.6 10*3/uL — AB (ref 4.0–10.5)

## 2017-05-08 LAB — BASIC METABOLIC PANEL
ANION GAP: 4 — AB (ref 5–15)
BUN: 44 mg/dL — ABNORMAL HIGH (ref 6–20)
CHLORIDE: 113 mmol/L — AB (ref 101–111)
CO2: 26 mmol/L (ref 22–32)
CREATININE: 1.65 mg/dL — AB (ref 0.44–1.00)
Calcium: 7.8 mg/dL — ABNORMAL LOW (ref 8.9–10.3)
GFR calc non Af Amer: 29 mL/min — ABNORMAL LOW (ref >60)
GFR, EST AFRICAN AMERICAN: 33 mL/min — AB (ref >60)
Glucose, Bld: 85 mg/dL (ref 65–99)
POTASSIUM: 4.4 mmol/L (ref 3.5–5.1)
SODIUM: 143 mmol/L (ref 135–145)

## 2017-05-08 LAB — GLUCOSE, CAPILLARY
GLUCOSE-CAPILLARY: 115 mg/dL — AB (ref 65–99)
GLUCOSE-CAPILLARY: 146 mg/dL — AB (ref 65–99)
Glucose-Capillary: 72 mg/dL (ref 65–99)
Glucose-Capillary: 85 mg/dL (ref 65–99)

## 2017-05-08 MED ORDER — SODIUM CHLORIDE 0.9 % IV SOLN
INTRAVENOUS | Status: DC
  Administered 2017-05-08: 21:00:00 via INTRAVENOUS

## 2017-05-08 NOTE — Progress Notes (Signed)
Daughter refused bed alarm for patient and states she will be at bedside. P.J. Linus Mako, RN

## 2017-05-08 NOTE — Progress Notes (Signed)
RN called by NT to assess blister noted on mons pubis when NT was changing patient's brief.  RN noted moderate clear fluid filled blister behind purewick, gauze placed between skin and purewick and brief removed and mesh panties with pad applied, purewick continues to be in place on LIS.  RN will continue to monitor and assess patient and report any issues. P.J. Linus Mako, RN

## 2017-05-08 NOTE — Progress Notes (Signed)
PROGRESS NOTE [facility]   Rebecca [name]   UGQ:[id number] DOB: Jul 29, 1940  DOA: 04/24/2017 PCP: [name], MD   Brief Narrative:  Rebecca [name] is a 76 year old female with PMH of COPD, not on home oxygen, PMR on chronic prednisone 10 MG daily, CAD status post DES to RCA 2006, stage III CKD, chronic diastolic CHF, MGUS, hypothyroid, recent hospitalization for right trochanteric bursitis, acute on chronic diastolic CHF and HCAP, discharged to SNF and subsequently home, treated for presumed UTI with Ceftin at discharge from SNF, presented with lethargy, fever, cough productive of yellow sputum and vomiting. Admitted for LLL pneumonia suspicious for aspiration pneumonia. Failed swallow evaluation on 11/14. Concern for esophageal dysmotility, [facility] consulted and workup completed. diet per speech therapy ordered but very poor oral intake and at risk for aspiration. Therefore placed cor track for temporary tube feeding on 11/16. Swallow eval repeated patient diet was advanced to dysphagia 1. Oral candida was noted patient was stated on diflucan now swallowing improving. Patient developed herpes labialis been treated with acyclovir and improving.   Subjective: Patient seen and examined, today complaining of hip pain. Lips are improving. Per daughter ate well last night. No acute events overnight. Remains afebrile   Assessment & Plan: Sepsis secondary to aspiration pneumonia - sepsis physiology resolved   Patient complete multiple abx therapy, pro-calcitonin negative,  Leukocytosis improved today. Repeat CXR 11/23 shows persistent consolidation and pleural effusion. Will continue to monitor off abx as patient is clinically improving. WBC continues to decrease.   Acute respiratory failure with hypoxia due to aspiration pneumonia - resolved CXR from yesterday ? Pleural effusion vs infiltrate vs atelectasis, patient is off oxygen, will continue to monitor. Flutter valve  See  above  Dysphagia - oral candidiasis ? Esophagitis  This is a chronic issue, last MBS recommending dysphagia 1 with honey thick liquids  Patient with candida on her tongue, she is at risk for esophagea candidiasis, been empirically on Diflucan 200 mg daily for 7 days swallowing improving. Patient may need EGD but family opted for conservative treatment for now. GI was consulted and has signed off. I have sent CMV panel as well since she is immunocompromise with chronic steroid use. Will have speech therapy to re-evaluate tomorrow PM for final feeding recommendations as she has improved somewhat.    Chronic leukocytosis - WBC improved  Labs done by PCP on October shows baseline white count around 10-14 Current WBC 13.6 Urine cx was treated with Merrem  Continue to monitor off antibiotics, pro-calcitonin negative. Patient on steroids   Herpes labialis - improving  Pt with hx of herpes virus Continue acyclovir    UTI - resolved   Enterobacter  Treated with Merrem   Acute on chronic kidney disease stage III Cr on admission 2.22, trended down to 1.54 Currently at 1.67 questionable if this is new baseline as there is slow trend upward  She is not drinking much fluids, will give hydration for 12 hrs and check Cr in AM if no improvement likely is new baseline. Monitor for signs of overload. Check BMP in AM   PMR on chronic prednisone Stable   Hypertension BP stable   Continue current regimen   Hypothyroidism  TSH and T4 normal  Continue current medication   Chronic hip pain - OP rheumatology felt to be pseudogout versus trochanteric bursitis patient is on steroids and colchicine.  Goals of care  She is DNR, family want patient to be discharge to home. She should  be followed by palliative/hospice care at home. Her prognosis is poor and family want quality time. I explained that she is at her best point and I don't think that there is much more improvement from now. Daughter express  understanding.    DVT prophylaxis: Heparin sq Code Status: DNR  Family Communication: Daughter at bedside Disposition Plan: Recommending SNF, although daughter want to take her home. D/c in 48 hrs   Consultants:   GI - [facility]  Procedures:   None  Antimicrobials: Anti-infectives (From admission, onward)   Start     Dose/Rate Route Frequency Ordered Stop   05/06/17 2200  acyclovir (ZOVIRAX) tablet 1,000 mg     1,000 mg Oral 2 times daily 05/06/17 1723     05/05/17 1645  fluconazole (DIFLUCAN) tablet 200 mg     200 mg Oral Daily 05/05/17 1634 05/12/17 0959   05/02/17 2200  meropenem (MERREM) 500 mg in sodium chloride 0.9 % 50 mL IVPB  Status:  Discontinued     500 mg 100 mL/hr over 30 Minutes Intravenous Every 12 hours 05/02/17 1053 05/03/17 1045   04/29/17 2200  meropenem (MERREM) 1 g in sodium chloride 0.9 % 100 mL IVPB  Status:  Discontinued     1 g 200 mL/hr over 30 Minutes Intravenous Every 12 hours 04/29/17 1208 05/02/17 1053   04/26/17 1500  meropenem (MERREM) 1 g in sodium chloride 0.9 % 100 mL IVPB  Status:  Discontinued     1 g 200 mL/hr over 30 Minutes Intravenous Every 24 hours 04/26/17 1349 04/29/17 1209   04/26/17 1230  vancomycin (VANCOCIN) IVPB 1000 mg/200 mL premix  Status:  Discontinued     1,000 mg 200 mL/hr over 60 Minutes Intravenous Every 48 hours 04/25/17 1225 04/28/17 1233   04/26/17 0600  piperacillin-tazobactam (ZOSYN) IVPB 2.25 g  Status:  Discontinued     2.25 g 100 mL/hr over 30 Minutes Intravenous Every 8 hours 04/25/17 2223 04/26/17 1327   04/25/17 1500  vancomycin (VANCOCIN) 500 mg in sodium chloride 0.9 % 100 mL IVPB  Status:  Discontinued     500 mg 100 mL/hr over 60 Minutes Intravenous Every 24 hours 04/24/17 1700 04/25/17 1225   04/25/17 1500  ceFEPIme (MAXIPIME) 1 g in dextrose 5 % 50 mL IVPB  Status:  Discontinued     1 g 100 mL/hr over 30 Minutes Intravenous Every 24 hours 04/24/17 1700 04/24/17 2225   04/24/17 2330   piperacillin-tazobactam (ZOSYN) IVPB 3.375 g  Status:  Discontinued     3.375 g 100 mL/hr over 30 Minutes Intravenous Every 8 hours 04/24/17 2225 04/25/17 2223   04/24/17 1445  ceFEPIme (MAXIPIME) 2 g in dextrose 5 % 50 mL IVPB     2 g 100 mL/hr over 30 Minutes Intravenous  Once 04/24/17 1432 04/24/17 1609   04/24/17 1445  vancomycin (VANCOCIN) IVPB 1000 mg/200 mL premix     1,000 mg 200 mL/hr over 60 Minutes Intravenous  Once 04/24/17 1432 04/24/17 1616         Objective: Vitals:   05/08/17 0614 05/08/17 0827 05/08/17 1328 05/08/17 1408  BP: (!) 142/55   (!) 122/46  Pulse: 91   89  Resp: 20     Temp: 98.2 F (36.8 C)     TempSrc: Oral     SpO2: 95% 100% 97%   Weight:      Height:        Intake/Output Summary (Last 24 hours) at 05/08/2017 1451 Last data  filed at 05/08/2017 0600 Gross per 24 hour  Intake 54.5 ml  Output -  Net 54.5 ml   Filed Weights   05/06/17 0459 05/07/17 0500 05/08/17 0136  Weight: 65 kg (143 lb 4.8 oz) 66.5 kg (146 lb 9.7 oz) 64.2 kg (141 lb 8.6 oz)    Examination:  General: Alert oriented, very interactive  HEENT: mouth ulcerated lips, with black discoloration, improving  Cardiovascular: RRR S1S2  Respiratory: CTA bilaterally, no wheezing, no rhonchi Abdominal: Soft, NT, ND, bowel sounds + Extremities: Trace LE edema   Data Reviewed: I have personally reviewed following labs and imaging studies  CBC: Recent Labs  Lab 05/02/17 0222 05/04/17 0823 05/05/17 0210 05/06/17 1136 05/07/17 0340 05/08/17 0439  WBC 17.5* 20.1* 19.1* 20.6* 15.6* 13.6*  NEUTROABS 15.6*  --   --  18.5* 13.1* 11.2*  HGB 8.8* 8.9* 8.6* 8.6* 7.7* 7.8*  HCT 27.0* 26.8* 25.3* 26.4* 24.0* 24.2*  MCV 93.8 94.7 94.8 96.0 97.2 97.6  PLT 184 182 182 191 163 353   Basic Metabolic Panel: Recent Labs  Lab 05/04/17 0823 05/05/17 0210 05/06/17 1136 05/07/17 0340 05/08/17 0439  NA 139 140 142 142 143  K 4.5 4.2 4.7 4.4 4.4  CL 109 109 110 111 113*  CO2 24 26 29  27 26   GLUCOSE 133* 145* 132* 126* 85  BUN 64* 64* 59* 55* 44*  CREATININE 1.54* 1.61* 1.57* 1.64* 1.65*  CALCIUM 7.2* 7.2* 7.5* 7.5* 7.8*   GFR: Estimated Creatinine Clearance: 25.3 mL/min (A) (by C-G formula based on SCr of 1.65 mg/dL (H)). Liver Function Tests: Recent Labs  Lab 05/02/17 0222 05/07/17 0340  AST 19 19  ALT 24 17  ALKPHOS 31* 30*  BILITOT 0.5 0.6  PROT 4.6* 3.8*  ALBUMIN 2.3* 1.9*   No results for input(s): LIPASE, AMYLASE in the last 168 hours. No results for input(s): AMMONIA in the last 168 hours. Coagulation Profile: No results for input(s): INR, PROTIME in the last 168 hours. Cardiac Enzymes: No results for input(s): CKTOTAL, [name], [name], TROPONINI in the last 168 hours. BNP (last 3 results) Recent Labs    03/29/17 1646  PROBNP 84.0   HbA1C: No results for input(s): HGBA1C in the last 72 hours. CBG: Recent Labs  Lab 05/07/17 0135 05/07/17 0748 05/07/17 1726 05/08/17 0813 05/08/17 1213  GLUCAP 141* 98 172* 72 85   Lipid Profile: No results for input(s): CHOL, [name], [name], [name], [name], LDLDIRECT in the last 72 hours. Thyroid Function Tests: Recent Labs    05/06/17 1136  TSH 2.185  FREET4 0.82   Anemia Panel: No results for input(s): VITAMINB12, FOLATE, FERRITIN, TIBC, IRON, RETICCTPCT in the last 72 hours. Sepsis Labs: Recent Labs  Lab 05/04/17 1642  PROCALCITON <0.10  LATICACIDVEN 2.6*    No results found for this or any previous visit (from the past 240 hour(s)).    Radiology Studies: Dg Chest Port 1 View  Result Date: 05/06/2017 CLINICAL DATA:  Cough. EXAM: PORTABLE CHEST 1 VIEW COMPARISON:  Radiograph 05/04/2017.  CT 03/21/2017 FINDINGS: Enteric tube is been removed. Elevated left hemidiaphragm with persistent confluent left basilar opacity and possible small pleural effusion. Minimal streaky right basilar atelectasis. Unchanged heart size and mediastinal contours with dense aortic atherosclerosis. Bronchovascular  crowding related to lower lung volumes, no convincing pulmonary edema. No pneumothorax. IMPRESSION: Unchanged left opacity from recent exam, likely combination of consolidation and pleural effusion. Electronically Signed   By: Jeb Levering M.D.   On: 05/06/2017 20:19  Scheduled Meds: . acyclovir  1,000 mg Oral BID  . chlorhexidine  15 mL Mouth Rinse BID  . clopidogrel  75 mg Oral QHS  . clotrimazole   Topical BID  . cloZAPine  300 mg Oral QHS  . colchicine  0.3 mg Oral QHS  . diltiazem  240 mg Oral Daily  . fluconazole  200 mg Oral Daily  . guaiFENesin  400 mg Oral TID  . heparin  5,000 Units Subcutaneous Q8H  . ipratropium-albuterol  3 mL Inhalation TID  . isosorbide mononitrate  20 mg Oral BID  . levothyroxine  75 mcg Oral QAC breakfast  . mouth rinse  15 mL Mouth Rinse q12n4p  . metoprolol tartrate  50 mg Oral BID  . mometasone-formoterol  2 puff Inhalation BID  . mupirocin ointment   Topical BID  . pantoprazole  40 mg Oral Daily  . polyethylene glycol  17 g Oral BID  . pravastatin  20 mg Oral QHS  . predniSONE  10 mg Oral Q breakfast  . saccharomyces boulardii  250 mg Oral BID  . senna-docusate  2 tablet Oral BID  . sucralfate  1 g Oral TID WC & HS  . tamsulosin  0.4 mg Oral Q breakfast   Continuous Infusions: . sodium chloride 10 mL/hr at 05/08/17 0033     LOS: 14 days    Time spent: Total of 25 minutes spent with pt, greater than 50% of which was spent in discussion of  treatment, counseling and coordination of care  Chipper Oman, MD Pager: Text Page via [email or web address]   If 7PM-7AM, please contact night-coverage [email or web address] 05/08/2017, 2:51 PM

## 2017-05-09 ENCOUNTER — Other Ambulatory Visit: Payer: Self-pay | Admitting: Licensed Clinical Social Worker

## 2017-05-09 DIAGNOSIS — B3781 Candidal esophagitis: Secondary | ICD-10-CM

## 2017-05-09 DIAGNOSIS — R630 Anorexia: Secondary | ICD-10-CM

## 2017-05-09 LAB — GLUCOSE, CAPILLARY
GLUCOSE-CAPILLARY: 99 mg/dL (ref 65–99)
GLUCOSE-CAPILLARY: 115 mg/dL — AB (ref 65–99)
Glucose-Capillary: 139 mg/dL — ABNORMAL HIGH (ref 65–99)
Glucose-Capillary: 79 mg/dL (ref 65–99)
Glucose-Capillary: 103 mg/dL — ABNORMAL HIGH (ref 65–99)
Glucose-Capillary: 156 mg/dL — ABNORMAL HIGH (ref 65–99)

## 2017-05-09 LAB — CBC WITH DIFFERENTIAL/PLATELET
BASOS ABS: 0 10*3/uL (ref 0.0–0.1)
BASOS PCT: 0 %
EOS PCT: 1 %
Eosinophils Absolute: 0.1 10*3/uL (ref 0.0–0.7)
HEMATOCRIT: 25.2 % — AB (ref 36.0–46.0)
Hemoglobin: 8.1 g/dL — ABNORMAL LOW (ref 12.0–15.0)
Lymphocytes Relative: 13 %
Lymphs Abs: 1.4 10*3/uL (ref 0.7–4.0)
MCH: 30.9 pg (ref 26.0–34.0)
MCHC: 32.1 g/dL (ref 30.0–36.0)
MCV: 96.2 fL (ref 78.0–100.0)
MONO ABS: 0.6 10*3/uL (ref 0.1–1.0)
MONOS PCT: 6 %
NEUTROS ABS: 8.5 10*3/uL — AB (ref 1.7–7.7)
Neutrophils Relative %: 80 %
PLATELETS: 186 10*3/uL (ref 150–400)
RBC: 2.62 MIL/uL — ABNORMAL LOW (ref 3.87–5.11)
RDW: 15.5 % (ref 11.5–15.5)
WBC: 10.7 10*3/uL — ABNORMAL HIGH (ref 4.0–10.5)

## 2017-05-09 LAB — BASIC METABOLIC PANEL
ANION GAP: 5 (ref 5–15)
BUN: 37 mg/dL — ABNORMAL HIGH (ref 6–20)
CALCIUM: 7.7 mg/dL — AB (ref 8.9–10.3)
CO2: 24 mmol/L (ref 22–32)
CREATININE: 1.68 mg/dL — AB (ref 0.44–1.00)
Chloride: 114 mmol/L — ABNORMAL HIGH (ref 101–111)
GFR, EST AFRICAN AMERICAN: 33 mL/min — AB (ref >60)
GFR, EST NON AFRICAN AMERICAN: 28 mL/min — AB (ref >60)
GLUCOSE: 78 mg/dL (ref 65–99)
Potassium: 4.6 mmol/L (ref 3.5–5.1)
Sodium: 143 mmol/L (ref 135–145)

## 2017-05-09 LAB — CMV IGM

## 2017-05-09 MED ORDER — ACYCLOVIR 400 MG PO TABS
400.0000 mg | ORAL_TABLET | Freq: Three times a day (TID) | ORAL | Status: DC
  Administered 2017-05-09 – 2017-05-11 (×7): 400 mg via ORAL
  Filled 2017-05-09 (×8): qty 1

## 2017-05-09 MED ORDER — MORPHINE SULFATE (PF) 2 MG/ML IV SOLN: 1.0000 mg | INTRAVENOUS | Status: DC | PRN

## 2017-05-09 MED ORDER — BISACODYL 10 MG RE SUPP
10.0000 mg | RECTAL | Status: AC
  Administered 2017-05-09: 10 mg via RECTAL
  Filled 2017-05-09: qty 1

## 2017-05-09 MED ORDER — HYDROCORTISONE 2.5 % RE CREA
TOPICAL_CREAM | Freq: Two times a day (BID) | RECTAL | Status: DC
  Administered 2017-05-09 – 2017-05-10 (×4): via RECTAL
  Filled 2017-05-09: qty 28.35

## 2017-05-09 MED ORDER — BISACODYL 10 MG RE SUPP: 10.0000 mg | Freq: Every day | RECTAL | Status: DC | PRN

## 2017-05-09 NOTE — Progress Notes (Signed)
Physical Therapy Treatment Patient Details Name: Rebecca [name] MRN: [id number] DOB: June 08, 1941 Today's Date: 05/09/2017    History of Present Illness Pt is a 76 y.o. female admitted on 04/24/17 with workup for sepsis secondary to HCAP and acute hypoxemic respiratory failure. Pertinent PMH includes COPD, CAD, HTN, CKD IV, diastolic HF, polymyalgia rheumatica, Chronic hip pain secondary to L5-S1 spondylosis, right trochanteric bursitis (admitted 03/14/17 for hip pain; steroid injection on 03/24/17 with Dr. Ninfa Linden). Of note, recently admitted 10/1-10/13 for HCAP and treated with vanco/zosyn.    PT Comments    Pt needs lots of encouragement to mobilize, in part due to fear of falling. Mod A +2 for pivot to BSC. Do not feel that this is safe for family at home with pt. Used stedy for [facility] to recliner which required mod A but was much safer with pt. Recommend this if available through hospice, otherwise, family will likely need hoyer for safety with transfers if she goes home. PT will continue to follow.    Follow Up Recommendations  SNF;Supervision/Assistance - 24 hour(though recommend HHPT if family takes pt home)     Equipment Recommendations  Other (comment)(stedy or hoyer)    Recommendations for Other Services       Precautions / Restrictions Precautions Precautions: Fall Restrictions Weight Bearing Restrictions: No    Mobility  Bed Mobility Overal bed mobility: Needs Assistance Bed Mobility: Rolling;Sidelying to Sit Rolling: Min guard Sidelying to sit: Min assist       General bed mobility comments: pt initiated rolling with use of rail and able to slide LE's to EOB. Pt allowed to grasp therapist's forearm and able to rise to sitting with only min A. Seems to be less fearful when she has control of mobility  Transfers Overall transfer level: Needs assistance Equipment used: 2 person hand held assist;Ambulation equipment used Transfers: Risk manager;Sit to/from  Stand Sit to Stand: Mod assist;+2 physical assistance Stand pivot transfers: Mod assist;+2 physical assistance       General transfer comment: pt transferred bed to [facility] with mod A +2 for power up. With with minimal ability to push through LE's. Do not think that this will be a safe way for family to transfer pt at home. For [facility] to recliner transfer used stedy and pt able to stand to it with mod A and be turned to chair. Family to ask hospice about use of equipment such as the stedy at home and if not available, would recommend hoyer for home.   Ambulation/Gait             General Gait Details: pt unable to step feet today in standing   Stairs            Wheelchair Mobility    Modified Rankin (Stroke Patients Only)       Balance Overall balance assessment: Needs assistance Sitting-balance support: No upper extremity supported Sitting balance-Leahy Scale: Fair Sitting balance - Comments: able to maintain static sitting    Standing balance support: Bilateral upper extremity supported Standing balance-Leahy Scale: Poor Standing balance comment: Min assist to maintain balance along with UE support                            Cognition Arousal/Alertness: Awake/alert Behavior During Therapy: WFL for tasks assessed/performed;Anxious Overall Cognitive Status: History of cognitive impairments - at baseline Area of Impairment: Attention;Memory;Following commands;Safety/judgement;Problem solving  Current Attention Level: Selective Memory: Decreased short-term memory Following Commands: Follows multi-step commands with increased time Safety/Judgement: Decreased awareness of deficits Awareness: Intellectual Problem Solving: Requires verbal cues        Exercises General Exercises - Lower Extremity Ankle Circles/Pumps: AROM;Both;10 reps;Seated    General Comments General comments (skin integrity, edema, etc.): pt veyr fearful of  falling and needs lots of encouragement for any out of bed activity      Pertinent Vitals/Pain Pain Assessment: Faces Faces Pain Scale: Hurts little more Pain Location: R hip and bottom Pain Descriptors / Indicators: Sore Pain Intervention(s): Limited activity within patient's tolerance;Monitored during session    Home Living                      Prior Function            PT Goals (current goals can now be found in the care plan section) Acute Rehab PT Goals Patient Stated Goal: get transfers as independent as possible to go home Progress towards PT goals: Not progressing toward goals - comment(fearful and weak)    Frequency    Min 2X/week      PT Plan Current plan remains appropriate    Co-evaluation              AM-PAC PT "6 Clicks" Daily Activity  Outcome Measure  Difficulty turning over in bed (including adjusting bedclothes, sheets and blankets)?: A Little Difficulty moving from lying on back to sitting on the side of the bed? : Unable Difficulty sitting down on and standing up from a chair with arms (e.g., wheelchair, bedside commode, etc,.)?: Unable Help needed moving to and from a bed to chair (including a wheelchair)?: A Lot Help needed walking in hospital room?: Total Help needed climbing 3-5 steps with a railing? : Total 6 Click Score: 9    End of Session Equipment Utilized During Treatment: Gait belt Activity Tolerance: Patient tolerated treatment well Patient left: in chair;with call bell/phone within reach;with family/visitor present Nurse Communication: Mobility status PT Visit Diagnosis: Other abnormalities of gait and mobility (R26.89);Muscle weakness (generalized) (M62.81)     Time: 1356-1430 PT Time Calculation (min) (ACUTE ONLY): 34 min  Charges:  $Therapeutic Activity: 23-37 mins                    G Codes:       Leighton Roach, PT  Acute Rehab Services  [place] 05/09/2017, 2:45 PM

## 2017-05-09 NOTE — Progress Notes (Addendum)
CM received consult: Patient might need home hospice. CM spoke with pt's daughter, [name], @ pt's bedside and learned she's waiting to speak with Vinie Sill( palliative nurse) today to discuss home vs residential hospice services. CM to f/u with pt's daughter after palliative meeting. Whitman Hero RN,BSN,CM

## 2017-05-09 NOTE — Patient Outreach (Signed)
[facility] Care Management  05/09/2017  Rebecca [name] 01-09-1941 [id number]  Assessment- CSW is completing case closure at this time due to policy procedure (over 10 days of inpatient stay.) Case can be reopened if needed.  Plan-CSW will update [facility] Care Management Assistant of case closure.  Rebecca [name], BSW, MSW, [name] Phone: [phone] Fax: [phone]

## 2017-05-09 NOTE — Progress Notes (Signed)
Daily Progress Note   Patient Name: Farrie Sann Martinek       Date: 05/09/2017 DOB: 1941/04/24  Age: 76 y.o. MRN#: [id number] Attending Physician: Aline August, MD Primary Care Physician: [name], MD Admit Date: 04/24/2017  Reason for Consultation/Follow-up: Establishing goals of care  Subjective: Ms. Ruest is more alert today. Uncomfortable trying to have a BM.   Length of Stay: 15  Current Medications: Scheduled Meds:  . acyclovir  400 mg Oral TID  . bisacodyl  10 mg Rectal NOW  . chlorhexidine  15 mL Mouth Rinse BID  . clopidogrel  75 mg Oral QHS  . clotrimazole   Topical BID  . cloZAPine  300 mg Oral QHS  . colchicine  0.3 mg Oral QHS  . diltiazem  240 mg Oral Daily  . fluconazole  200 mg Oral Daily  . guaiFENesin  400 mg Oral TID  . heparin  5,000 Units Subcutaneous Q8H  . ipratropium-albuterol  3 mL Inhalation TID  . isosorbide mononitrate  20 mg Oral BID  . levothyroxine  75 mcg Oral QAC breakfast  . mouth rinse  15 mL Mouth Rinse q12n4p  . metoprolol tartrate  50 mg Oral BID  . mometasone-formoterol  2 puff Inhalation BID  . mupirocin ointment   Topical BID  . pantoprazole  40 mg Oral Daily  . polyethylene glycol  17 g Oral BID  . pravastatin  20 mg Oral QHS  . predniSONE  10 mg Oral Q breakfast  . saccharomyces boulardii  250 mg Oral BID  . senna-docusate  2 tablet Oral BID  . sucralfate  1 g Oral TID WC & HS  . tamsulosin  0.4 mg Oral Q breakfast    Continuous Infusions: . sodium chloride 10 mL/hr at 05/08/17 0033    PRN Meds: acetaminophen **OR** acetaminophen, [START ON 05/10/2017] bisacodyl, fluticasone, gi cocktail, lip balm, morphine injection, ondansetron **OR** ondansetron (ZOFRAN) IV, RESOURCE THICKENUP CLEAR, traMADol  Physical Exam           Constitutional: She is oriented to person, place, and time. She appears well-developed. She appears lethargic. She appears ill.  Frail, elderly  HENT:  Head: Normocephalic and atraumatic.  Cardiovascular: Normal rate and regular rhythm.  Pulmonary/Chest: Effort normal. No accessory muscle usage. No tachypnea. No respiratory distress.  Abdominal: Soft.  Normal appearance.  Neurological: She appears more alert today.  Skin:  Extensive bruising to bilat arms (chronic steroid use + anticoagulation). Skin thin and frail.   Nursing note and vitals reviewed.    Vital Signs: BP (!) 145/58 (BP Location: Left Arm)   Pulse 78   Temp 98.8 F (37.1 C) (Oral)   Resp 16   Ht 5' 3"  (1.6 m)   Wt 65.6 kg (144 lb 10 oz)   SpO2 97%   BMI 25.62 kg/m  SpO2: SpO2: 97 % O2 Device: O2 Device: Not Delivered O2 Flow Rate: O2 Flow Rate (L/min): 3 L/min  Intake/output summary:   Intake/Output Summary (Last 24 hours) at 05/09/2017 1405 Last data filed at 05/09/2017 4920 Gross per 24 hour  Intake 480 ml  Output 950 ml  Net -470 ml   LBM: Last BM Date: 05/09/17 Baseline Weight: Weight: 59.9 kg (132 lb) Most recent weight: Weight: 65.6 kg (144 lb 10 oz)       Palliative Assessment/Data: 30%   Flowsheet Rows     Most Recent Value  Intake Tab  Referral Department  Hospitalist  Unit at Time of Referral  Intermediate Care Unit  Palliative Care Primary Diagnosis  Neurology  Date Notified  04/29/17  Palliative Care Type  Return patient Palliative Care  Reason for referral  Clarify Goals of Care  Date of Admission  04/24/17  Date first seen by Palliative Care  04/29/17  # of days Palliative referral response time  0 Day(s)  # of days IP prior to Palliative referral  5  Clinical Assessment  Psychosocial & Spiritual Assessment  Palliative Care Outcomes      Patient Active Problem List   Diagnosis Date Noted  . Acute esophagitis   . Poor fluid intake   . Dysphagia 04/27/2017  . Pulmonary  infiltrates c/w recurrent aspiration pneumonitis vs hcap 03/30/2017  . Prerenal azotemia 03/30/2017  . Dyspnea on exertion 03/29/2017  . Weakness   . Hypervolemia   . Lethargy   . Pain of right hip joint   . Acute right hip pain 03/14/2017  . Physical debility 03/08/2017  . Recurrent UTI 03/08/2017  . Ascites 03/08/2017  . Chronic pain disorder 03/07/2017  . Chronic constipation   . Community acquired pneumonia   . Palliative care encounter   . Encounter for hospice care discussion   . TIA (transient ischemic attack) 01/27/2017  . Pancreatic mass 01/27/2017  . Aortic atherosclerosis ([place]) 01/27/2017  . Hyponatremia 01/25/2017  . Generalized weakness 01/25/2017  . Fatigue 01/11/2017  . Mixed hyperlipidemia 12/02/2016  . Encephalopathy 11/20/2016  . Altered mental status 11/19/2016  . Aspiration pneumonia of both lower lobes due to gastric secretions ([place]) 10/10/2016  . Aspiration pneumonia ([place]) 10/07/2016  . Lobar pneumonia, unspecified organism ([place]) 10/07/2016  . Ileus ([place]) 10/07/2016  . AKI (acute kidney injury) ([place]) 09/12/2016  . HAP (hospital-acquired pneumonia) 09/12/2016  . Respiratory distress 09/12/2016  . Pressure injury of skin 07/15/2016  . Essential hypertension 07/14/2016  . Acute encephalopathy 07/14/2016  . Coronary artery disease involving native coronary artery of native heart without angina pectoris 05/14/2016  . CKD stage 4, GFR 15-29 ml/min  08/11/2015  . 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 Leukocytosis 01/28/2014  . History of TIA - May 2015- Plavix 10/16/2013  . Polymyalgia rheumatica ([place]) 10/03/2012  . Chronic diastolic (congestive) heart failure ([place]) 07/15/2012  . Trochanteric bursitis of right hip 04/11/2012  . Dysuria 08/30/2011  . History  of small bowel obstruction 11/04/2010  . Schizoaffective disorder ([place]) 01/10/2008  . Dyslipidemia 01/25/2007  . Hypothyroidism 08/11/2006  . Anemia in chronic renal  disease 08/11/2006  . GASTROESOPHAGEAL REFLUX, NO ESOPHAGITIS 08/11/2006    Palliative Care Assessment & Plan   HPI: 76 y.o. female  with past medical history of COPD, PMR on chronic prednisone, CAD, diastolic CHF, CKD stage III, MGUS, recent admission to [facility] with right-sided trochanteric bursitis and went to [facility] for rehab but had returned home recently with daughter. Was admitted on 04/24/2017 with lethargy and fever r/t UTI and aspiration pneumonia. Has struggled with worsening dysphagia and have proceeded with temporary Cortrak placement to allow for rest for esophagitis and SLP will reassess swallow function after the weekend. MBS scheduled for 1300 05/03/17. Intake tolerance fluctuating but is taking in more fluids and eating up to half her meals at times.   Assessment: I met today with Ms. Stratmann and [place]. We discussed where we go from here. Ms. Broadfoot is very clear in her wishes. She asks about hospice and we discuss the options. Ultimately we decide that returning home with [name] and having hospice to follow will be their best option. Ms. Bradt does not desire return to the hospital and is already asking for her IV to be removed (would not want another IV). Goals are clear for more comfort focused care upon discharge. However, they do worry about having medications and po antibiotics if indicated at home with hospice. I reassure [name] that this should always be discussed and as long as Ms. Saar is able to take po. They are planning return home with hospice Wednesday.   I also spoke with other daughter, Early Chars, via telephone and son, Rodman Key, who are on board with home with hospice.   Recommendations/Plan:  Cortrak out now.   MBS done -  Dys I, honey thick fluids. Intake poor.   Added carafate TID. GI cocktail BID prn. Receiving Diflucan.   Constipation: Dulcolax supp x 1 and daily prn.   Hemorrhoids: Hydrocortisone 2.5% rectal cream BID.   Code Status:  DNR  Prognosis:  Weeks to  months. < 6 months. Depends on toleration of intake.   Discharge Planning:  Home with hospice   Thank you for allowing the Palliative Medicine Team to assist in the care of this patient.   Total Time 60 min Prolonged Time Billed  no       Greater than 50%  of this time was spent counseling and coordinating care related to the above assessment and plan.  Vinie Sill, NP Palliative Medicine Team Pager # [phone] (M-F 8a-5p) Team Phone # [phone] (Nights/Weekends)

## 2017-05-09 NOTE — Progress Notes (Signed)
Patient ID: Rebecca [name], female   DOB: January 27, 1941, 76 y.o.   MRN: [id number]  PROGRESS NOTE    Rebecca [name]  HYW:[id number] DOB: [birth year] DOA: 04/24/2017 PCP: [name], MD   Brief Narrative:  76 year old female with history of COPD, PMR on chronic prednisone 10 MG daily, CAD status post DES to RCA 2006, stage III CKD, chronic diastolic CHF, MGUS, hypothyroid, recent hospitalization for right trochanteric bursitis, acute on chronic diastolic CHF and HCAP, discharged to SNF and subsequently home, treated for presumed UTI with Ceftin at discharge from SNF, presented with lethargy, fever, cough productive of yellow sputum and vomiting. Admitted for LLL pneumonia suspicious for aspiration pneumonia.  Failed swallow evaluation on 11/14. Concern for esophageal dysmotility, [facility] consulted and workup completed. diet per speech therapy ordered but very poor oral intake and at risk for aspiration. Therefore placed cor track for temporary tube feeding on 11/16. Swallow eval repeated patient diet was advanced to dysphagia 1. Oral candida was noted patient was stated on diflucan now swallowing improving. Patient developed herpes labialis been treated with acyclovir and improving.    Assessment & Plan:   Principal Problem:   Aspiration pneumonia ([place]) Active Problems:   Hypothyroidism   Schizoaffective disorder ([place])   GASTROESOPHAGEAL REFLUX, NO ESOPHAGITIS   Polymyalgia rheumatica (H[facility])   Essential hypertension   Lobar pneumonia, unspecified organism (H[facility])   TIA (transient ischemic attack)   Recurrent UTI   Dysphagia   Acute esophagitis   Poor fluid intake   Sepsis secondary to aspiration pneumonia - Resolved. Currently off antibiotics  Acute respiratory failure with hypoxia due to aspiration pneumonia - Resolved. Patient is off oxygen. - Continue incentive spirometry  Dysphagia - Continue diet as per SLP recommendations  Probable oral/esophageal candidiasis - Continue empiric  Diflucan. Diet and appetite improving as per the patient and family member. - Patient would not want NG tube feeding or PEG tube feeding. Patient opted for conservative treatment for now; would not consider EGD.  Leukocytosis - Resolved  Herpes labialis - Improving. Change acyclovir to 400 mg by mouth 3 times a day  UTI with Enterobacter -Resolved. Treated with medical  Acute and chronic kidney disease stage III - Creatinine at baseline. Will not order labs for tomorrow  PMR on chronic prednisone - Continue prednisone  Hypertension -BP stable. Continue current regimen  Hypothyroidism - Continue current medication  Chronic Pain - Continue colchicine and prednisone  Generalized deconditioning/goals of care - Patient is very deconditioned and has guarded/poor outcome. Palliative care following. Patient is interested in exploring options of hospice. If condition worsens, consider hospice/comfort measures. We will follow further recommendations from palliative care. Will not order blood work for tomorrow and wait for palliative care evaluation today. Care management consult.  DVT prophylaxis: Heparin Code Status:  DO NOT RESUSCITATE Family Communication: Spoke to daughter [name] at bedside Disposition Plan: Probable home with hospice versus residential hospice  Consultants: GI Procedures: None  Antimicrobials:  Anti-infectives (From admission, onward)   Start     Dose/Rate Route Frequency Ordered Stop   05/09/17 1600  acyclovir (ZOVIRAX) tablet 400 mg     400 mg Oral 3 times daily 05/09/17 1122     05/06/17 2200  acyclovir (ZOVIRAX) tablet 1,000 mg  Status:  Discontinued     1,000 mg Oral 2 times daily 05/06/17 1723 05/09/17 1122   05/05/17 1645  fluconazole (DIFLUCAN) tablet 200 mg     200 mg Oral Daily 05/05/17 1634 05/12/17 0959  05/02/17 2200  meropenem (MERREM) 500 mg in sodium chloride 0.9 % 50 mL IVPB  Status:  Discontinued     500 mg 100 mL/hr over 30 Minutes  Intravenous Every 12 hours 05/02/17 1053 05/03/17 1045   04/29/17 2200  meropenem (MERREM) 1 g in sodium chloride 0.9 % 100 mL IVPB  Status:  Discontinued     1 g 200 mL/hr over 30 Minutes Intravenous Every 12 hours 04/29/17 1208 05/02/17 1053   04/26/17 1500  meropenem (MERREM) 1 g in sodium chloride 0.9 % 100 mL IVPB  Status:  Discontinued     1 g 200 mL/hr over 30 Minutes Intravenous Every 24 hours 04/26/17 1349 04/29/17 1209   04/26/17 1230  vancomycin (VANCOCIN) IVPB 1000 mg/200 mL premix  Status:  Discontinued     1,000 mg 200 mL/hr over 60 Minutes Intravenous Every 48 hours 04/25/17 1225 04/28/17 1233   04/26/17 0600  piperacillin-tazobactam (ZOSYN) IVPB 2.25 g  Status:  Discontinued     2.25 g 100 mL/hr over 30 Minutes Intravenous Every 8 hours 04/25/17 2223 04/26/17 1327   04/25/17 1500  vancomycin (VANCOCIN) 500 mg in sodium chloride 0.9 % 100 mL IVPB  Status:  Discontinued     500 mg 100 mL/hr over 60 Minutes Intravenous Every 24 hours 04/24/17 1700 04/25/17 1225   04/25/17 1500  ceFEPIme (MAXIPIME) 1 g in dextrose 5 % 50 mL IVPB  Status:  Discontinued     1 g 100 mL/hr over 30 Minutes Intravenous Every 24 hours 04/24/17 1700 04/24/17 2225   04/24/17 2330  piperacillin-tazobactam (ZOSYN) IVPB 3.375 g  Status:  Discontinued     3.375 g 100 mL/hr over 30 Minutes Intravenous Every 8 hours 04/24/17 2225 04/25/17 2223   04/24/17 1445  ceFEPIme (MAXIPIME) 2 g in dextrose 5 % 50 mL IVPB     2 g 100 mL/hr over 30 Minutes Intravenous  Once 04/24/17 1432 04/24/17 1609   04/24/17 1445  vancomycin (VANCOCIN) IVPB 1000 mg/200 mL premix     1,000 mg 200 mL/hr over 60 Minutes Intravenous  Once 04/24/17 1432 04/24/17 1616         Subjective: Patient seen and examined at bedside. She is awake and answers some questions. She complains of pain in her back and hip. No overnight fever or vomiting. Her appetite is improving as per the patient's daughter  Objective: Vitals:   05/08/17 2138  05/09/17 0633 05/09/17 0742 05/09/17 0926  BP: (!) 114/46 (!) 145/58    Pulse: 71 78    Resp: 19 16    Temp: 98.3 F (36.8 C) 98.8 F (37.1 C)    TempSrc: Oral Oral    SpO2: 98% 97% 97%   Weight:    65.6 kg (144 lb 10 oz)  Height:        Intake/Output Summary (Last 24 hours) at 05/09/2017 1115 Last data filed at 05/09/2017 4315 Gross per 24 hour  Intake 480 ml  Output 950 ml  Net -470 ml   Filed Weights   05/07/17 0500 05/08/17 0136 05/09/17 0926  Weight: 66.5 kg (146 lb 9.7 oz) 64.2 kg (141 lb 8.6 oz) 65.6 kg (144 lb 10 oz)    Examination:  General exam: Appears calm and comfortable; ill-looking elderly female Respiratory system: Bilateral decreased breath sound at bases with scattered crackles Cardiovascular system: S1 & S2 heard, rate controlled  Gastrointestinal system: Abdomen is nondistended, soft and nontender. Normal bowel sounds heard. Extremities: No cyanosis, clubbing; trace edema  Data Reviewed: I have personally reviewed following labs and imaging studies  CBC: Recent Labs  Lab 05/05/17 0210 05/06/17 1136 05/07/17 0340 05/08/17 0439 05/09/17 0416  WBC 19.1* 20.6* 15.6* 13.6* 10.7*  NEUTROABS  --  18.5* 13.1* 11.2* 8.5*  HGB 8.6* 8.6* 7.7* 7.8* 8.1*  HCT 25.3* 26.4* 24.0* 24.2* 25.2*  MCV 94.8 96.0 97.2 97.6 96.2  PLT 182 191 163 197 099   Basic Metabolic Panel: Recent Labs  Lab 05/05/17 0210 05/06/17 1136 05/07/17 0340 05/08/17 0439 05/09/17 0416  NA 140 142 142 143 143  K 4.2 4.7 4.4 4.4 4.6  CL 109 110 111 113* 114*  CO2 26 29 27 26 24   GLUCOSE 145* 132* 126* 85 78  BUN 64* 59* 55* 44* 37*  CREATININE 1.61* 1.57* 1.64* 1.65* 1.68*  CALCIUM 7.2* 7.5* 7.5* 7.8* 7.7*   GFR: Estimated Creatinine Clearance: 25.1 mL/min (A) (by C-G formula based on SCr of 1.68 mg/dL (H)). Liver Function Tests: Recent Labs  Lab 05/07/17 0340  AST 19  ALT 17  ALKPHOS 30*  BILITOT 0.6  PROT 3.8*  ALBUMIN 1.9*   No results for input(s): LIPASE,  AMYLASE in the last 168 hours. No results for input(s): AMMONIA in the last 168 hours. Coagulation Profile: No results for input(s): INR, PROTIME in the last 168 hours. Cardiac Enzymes: No results for input(s): CKTOTAL, [name], [name], TROPONINI in the last 168 hours. BNP (last 3 results) Recent Labs    03/29/17 1646  PROBNP 84.0   HbA1C: No results for input(s): HGBA1C in the last 72 hours. CBG: Recent Labs  Lab 05/08/17 0813 05/08/17 1213 05/08/17 1715 05/08/17 2141 05/09/17 0741  GLUCAP 72 85 115* 146* 79   Lipid Profile: No results for input(s): CHOL, [name], [name], [name], [name], LDLDIRECT in the last 72 hours. Thyroid Function Tests: Recent Labs    05/06/17 1136  TSH 2.185  FREET4 0.82   Anemia Panel: No results for input(s): VITAMINB12, FOLATE, FERRITIN, TIBC, IRON, RETICCTPCT in the last 72 hours. Sepsis Labs: Recent Labs  Lab 05/04/17 1642  PROCALCITON <0.10  LATICACIDVEN 2.6*    No results found for this or any previous visit (from the past 240 hour(s)).       Radiology Studies: No results found.      Scheduled Meds: . acyclovir  1,000 mg Oral BID  . chlorhexidine  15 mL Mouth Rinse BID  . clopidogrel  75 mg Oral QHS  . clotrimazole   Topical BID  . cloZAPine  300 mg Oral QHS  . colchicine  0.3 mg Oral QHS  . diltiazem  240 mg Oral Daily  . fluconazole  200 mg Oral Daily  . guaiFENesin  400 mg Oral TID  . heparin  5,000 Units Subcutaneous Q8H  . ipratropium-albuterol  3 mL Inhalation TID  . isosorbide mononitrate  20 mg Oral BID  . levothyroxine  75 mcg Oral QAC breakfast  . mouth rinse  15 mL Mouth Rinse q12n4p  . metoprolol tartrate  50 mg Oral BID  . mometasone-formoterol  2 puff Inhalation BID  . mupirocin ointment   Topical BID  . pantoprazole  40 mg Oral Daily  . polyethylene glycol  17 g Oral BID  . pravastatin  20 mg Oral QHS  . predniSONE  10 mg Oral Q breakfast  . saccharomyces boulardii  250 mg Oral BID  .  senna-docusate  2 tablet Oral BID  . sucralfate  1 g Oral TID WC & HS  .  tamsulosin  0.4 mg Oral Q breakfast   Continuous Infusions: . sodium chloride 10 mL/hr at 05/08/17 0033     LOS: 15 days        Aline August, MD [facility] Pager [phone]  If 7PM-7AM, please contact night-coverage [email or web address] Password [email or web address] 05/09/2017, 11:15 AM

## 2017-05-09 NOTE — Progress Notes (Signed)
  Speech Language Pathology Treatment: Dysphagia  Patient Details Name: Rebecca [name] MRN: [id number] DOB: July 03, 1940 Today's Date: 05/09/2017 Time: 5631-4970 SLP Time Calculation (min) (ACUTE ONLY): 17 min  Assessment / Plan / Recommendation Clinical Impression  Dysphagia treatment today involved education with pt's daughter and po observation. Daughter stated pt consumed increased amount of po's this am and yesterday and recalled conversation with MD stating pt will continue to aspirate. Pt's daughter asked this SLP "do you think, even with all that we are doing, she is still going to aspirate. SLP responded "yes, she will continue to aspirate. Cannot stop pt from likely aspirating intermittently however, modifying her diet and compensatory strategies will hopefully mitigate frequency and severity of aspiration. Daughter teared up but voiced understooding. Also explained pt's ability to eat/alertness will wax and wane (even if she has a "good day"). Pt alert, very conversive and aware. SLP repositioned pt and observed with teaspoon amounts honey thick coffee with mild delayed throat clear. Recommend continue current diet/liquids and strategies. Pt may be possibly discharging Wed per daughter.    HPI HPI: Rebecca [name] a 76 y.o.femalewith medical history significant ofCOPD, pna,PMR, CAD, CVA, pna, GERD, pancreatic mass, barium esophagram revealing mild esophageal dysmotility, puree diet at baseline (thin liquids), schizoaffective disorder, CKD stage IV, diastolic heart failure, MGUS, hypothyroidism who was most recently admitted at [facility] hospital secondary to right-sided trochanteric bursitis, acute on chronic diastolic heart failure and healthcare acquired pneumonia. This admission found to have healthcare associated pna). MBS 09/2016 reveaked suspected primary esophageal deficits, pharyngeal residue, trace penetration thin to cords silent while consuming barium pill. Dys 3, thin recommended.  Repeat MBS today for possible recommendation for liquids.        SLP Plan  Continue with current plan of care       Recommendations  Diet recommendations: Dysphagia 1 (puree);Honey-thick liquid Liquids provided via: Teaspoon Medication Administration: Whole meds with puree Supervision: Patient able to self feed;Full supervision/cueing for compensatory strategies;Staff to assist with self feeding Compensations: Minimize environmental distractions;Slow rate;Small sips/bites;Multiple dry swallows after each bite/sip;Clear throat after each swallow;Hard cough after swallow Postural Changes and/or Swallow Maneuvers: Seated upright 90 degrees;Upright 30-60 min after meal                Oral Care Recommendations: Oral care BID Follow up Recommendations: 24 hour supervision/assistance SLP Visit Diagnosis: Dysphagia, pharyngeal phase (R13.13) Plan: Continue with current plan of care       GO                [name] 05/09/2017, 2:13 PM  Orbie Pyo Colvin Caroli.[employer] [phone]

## 2017-05-09 NOTE — Care Management Important Message (Signed)
Important Message  Patient Details  Name: MINETTE MANDERS MRN: [id number] Date of Birth: 11/02/1940   Medicare Important Message Given:  Yes    Nathen May 05/09/2017, 10:46 AM

## 2017-05-10 DIAGNOSIS — R630 Anorexia: Secondary | ICD-10-CM

## 2017-05-10 LAB — GLUCOSE, CAPILLARY
GLUCOSE-CAPILLARY: 73 mg/dL (ref 65–99)
GLUCOSE-CAPILLARY: 99 mg/dL (ref 65–99)
Glucose-Capillary: 101 mg/dL — ABNORMAL HIGH (ref 65–99)
Glucose-Capillary: 132 mg/dL — ABNORMAL HIGH (ref 65–99)

## 2017-05-10 NOTE — Progress Notes (Signed)
Patient ID: Rebecca [name], female   DOB: 24-Oct-1940, 76 y.o.   MRN: [id number]  PROGRESS NOTE    Rebecca [name]  JME:[id number] DOB: Mar 13, 1941 DOA: 04/24/2017 PCP: [name], MD   Brief Narrative:  76 year old female with history of COPD, PMR on chronic prednisone 10 MG daily, CAD status post DES to RCA 2006, stage III CKD, chronic diastolic CHF, MGUS, hypothyroid, recent hospitalization for right trochanteric bursitis, acute on chronic diastolic CHF and HCAP, discharged to SNF and subsequently home, treated for presumed UTI with Ceftin at discharge from SNF, presented with lethargy, fever, cough productive of yellow sputum and vomiting. Admitted for LLL pneumonia suspicious for aspiration pneumonia.  Failed swallow evaluation on 11/14. Concern for esophageal dysmotility, [facility] consulted and workup completed. diet per speech therapy ordered but very poor oral intake and at risk for aspiration. Therefore placed cor track for temporary tube feeding on 11/16. Swallow eval repeated patient diet was advanced to dysphagia 1. Oral candida was noted patient was stated on diflucan now swallowing improving. Patient developed herpes labialis which has been treated with acyclovir and improving. palliative care following.   Assessment & Plan:   Principal Problem:   Aspiration pneumonia ([place]) Active Problems:   Hypothyroidism   Schizoaffective disorder ([place])   GASTROESOPHAGEAL REFLUX, NO ESOPHAGITIS   Polymyalgia rheumatica (HCC)   Essential hypertension   Lobar pneumonia, unspecified organism (HCC)   TIA (transient ischemic attack)   Goals of care, counseling/discussion   Recurrent UTI   Dysphagia   Acute esophagitis   Poor fluid intake   Poor appetite   Sepsis secondary to aspiration pneumonia - Resolved. Currently off antibiotics  Acute respiratory failure with hypoxia due to aspiration pneumonia - Resolved. Patient is off oxygen. - Continue incentive spirometry  Dysphagia - Continue  diet as per SLP recommendations  Probable oral/esophageal candidiasis - Continue empiric Diflucan. Diet and appetite slightly improving as per the patient and family member. - Patient would not want NG tube feeding or PEG tube feeding. Patient opted for conservative treatment for now; would not consider EGD.  Leukocytosis - Resolved  Herpes labialis - Improving. Change acyclovir for total of 7-10days  UTI with Enterobacter -Resolved. Treated with merrem  Acute and chronic kidney disease stage III - Creatinine at baseline. Will not order labs for tomorrow  PMR on chronic prednisone - Continue prednisone  Hypertension -BP stable. Continue current regimen  Hypothyroidism - Continue current medication  Chronic Pain - Continue colchicine and prednisone  Generalized deconditioning/goals of care - Patient is very deconditioned and has guarded/poor outcome. Palliative care following. Patient has agreed for discharge home with home hospice which is being arranged.  DVT prophylaxis: Will discontinue heparin as we are focusing more on comfort and patient does not have any IV access Code Status:  DO NOT RESUSCITATE Family Communication: Spoke to daughter [name] at bedside Disposition Plan: Probable home with hospice tomorrow  Consultants: GI Procedures: None  Antimicrobials:  Anti-infectives (From admission, onward)   Start     Dose/Rate Route Frequency Ordered Stop   05/09/17 1600  acyclovir (ZOVIRAX) tablet 400 mg     400 mg Oral 3 times daily 05/09/17 1122     05/06/17 2200  acyclovir (ZOVIRAX) tablet 1,000 mg  Status:  Discontinued     1,000 mg Oral 2 times daily 05/06/17 1723 05/09/17 1122   05/05/17 1645  fluconazole (DIFLUCAN) tablet 200 mg     200 mg Oral Daily 05/05/17 1634 05/12/17 0959   05/02/17  2200  meropenem (MERREM) 500 mg in sodium chloride 0.9 % 50 mL IVPB  Status:  Discontinued     500 mg 100 mL/hr over 30 Minutes Intravenous Every 12 hours 05/02/17 1053  05/03/17 1045   04/29/17 2200  meropenem (MERREM) 1 g in sodium chloride 0.9 % 100 mL IVPB  Status:  Discontinued     1 g 200 mL/hr over 30 Minutes Intravenous Every 12 hours 04/29/17 1208 05/02/17 1053   04/26/17 1500  meropenem (MERREM) 1 g in sodium chloride 0.9 % 100 mL IVPB  Status:  Discontinued     1 g 200 mL/hr over 30 Minutes Intravenous Every 24 hours 04/26/17 1349 04/29/17 1209   04/26/17 1230  vancomycin (VANCOCIN) IVPB 1000 mg/200 mL premix  Status:  Discontinued     1,000 mg 200 mL/hr over 60 Minutes Intravenous Every 48 hours 04/25/17 1225 04/28/17 1233   04/26/17 0600  piperacillin-tazobactam (ZOSYN) IVPB 2.25 g  Status:  Discontinued     2.25 g 100 mL/hr over 30 Minutes Intravenous Every 8 hours 04/25/17 2223 04/26/17 1327   04/25/17 1500  vancomycin (VANCOCIN) 500 mg in sodium chloride 0.9 % 100 mL IVPB  Status:  Discontinued     500 mg 100 mL/hr over 60 Minutes Intravenous Every 24 hours 04/24/17 1700 04/25/17 1225   04/25/17 1500  ceFEPIme (MAXIPIME) 1 g in dextrose 5 % 50 mL IVPB  Status:  Discontinued     1 g 100 mL/hr over 30 Minutes Intravenous Every 24 hours 04/24/17 1700 04/24/17 2225   04/24/17 2330  piperacillin-tazobactam (ZOSYN) IVPB 3.375 g  Status:  Discontinued     3.375 g 100 mL/hr over 30 Minutes Intravenous Every 8 hours 04/24/17 2225 04/25/17 2223   04/24/17 1445  ceFEPIme (MAXIPIME) 2 g in dextrose 5 % 50 mL IVPB     2 g 100 mL/hr over 30 Minutes Intravenous  Once 04/24/17 1432 04/24/17 1609   04/24/17 1445  vancomycin (VANCOCIN) IVPB 1000 mg/200 mL premix     1,000 mg 200 mL/hr over 60 Minutes Intravenous  Once 04/24/17 1432 04/24/17 1616         Subjective: Patient seen and examined at bedside. She is awake and answers some questions. Her appetite is still not that good. No overnight fever or vomiting Objective: Vitals:   05/10/17 0625 05/10/17 0803 05/10/17 0807 05/10/17 0942  BP: (!) 156/71   (!) 152/55  Pulse: 73   78  Resp: 20       Temp: 98.1 F (36.7 C)     TempSrc: Axillary     SpO2: 97% 98% 98%   Weight: 64.6 kg (142 lb 6.7 oz)     Height:        Intake/Output Summary (Last 24 hours) at 05/10/2017 1021 Last data filed at 05/10/2017 0053 Gross per 24 hour  Intake 360 ml  Output 660 ml  Net -300 ml   Filed Weights   05/08/17 0136 05/09/17 0926 05/10/17 0625  Weight: 64.2 kg (141 lb 8.6 oz) 65.6 kg (144 lb 10 oz) 64.6 kg (142 lb 6.7 oz)    Examination:  General exam: Appears calm and comfortable; ill-looking elderly female Respiratory system: Bilateral decreased breath sound at bases with scattered crackles Cardiovascular system: S1 & S2 heard, rate controlled    Data Reviewed: I have personally reviewed following labs and imaging studies  CBC: Recent Labs  Lab 05/05/17 0210 05/06/17 1136 05/07/17 0340 05/08/17 0439 05/09/17 0416  WBC 19.1* 20.6*  15.6* 13.6* 10.7*  NEUTROABS  --  18.5* 13.1* 11.2* 8.5*  HGB 8.6* 8.6* 7.7* 7.8* 8.1*  HCT 25.3* 26.4* 24.0* 24.2* 25.2*  MCV 94.8 96.0 97.2 97.6 96.2  PLT 182 191 163 197 268   Basic Metabolic Panel: Recent Labs  Lab 05/05/17 0210 05/06/17 1136 05/07/17 0340 05/08/17 0439 05/09/17 0416  NA 140 142 142 143 143  K 4.2 4.7 4.4 4.4 4.6  CL 109 110 111 113* 114*  CO2 26 29 27 26 24   GLUCOSE 145* 132* 126* 85 78  BUN 64* 59* 55* 44* 37*  CREATININE 1.61* 1.57* 1.64* 1.65* 1.68*  CALCIUM 7.2* 7.5* 7.5* 7.8* 7.7*   GFR: Estimated Creatinine Clearance: 25 mL/min (A) (by C-G formula based on SCr of 1.68 mg/dL (H)). Liver Function Tests: Recent Labs  Lab 05/07/17 0340  AST 19  ALT 17  ALKPHOS 30*  BILITOT 0.6  PROT 3.8*  ALBUMIN 1.9*   No results for input(s): LIPASE, AMYLASE in the last 168 hours. No results for input(s): AMMONIA in the last 168 hours. Coagulation Profile: No results for input(s): INR, PROTIME in the last 168 hours. Cardiac Enzymes: No results for input(s): CKTOTAL, [name], [name], TROPONINI in the last 168  hours. BNP (last 3 results) Recent Labs    03/29/17 1646  PROBNP 84.0   HbA1C: No results for input(s): HGBA1C in the last 72 hours. CBG: Recent Labs  Lab 05/09/17 0741 05/09/17 1224 05/09/17 1623 05/09/17 2123 05/10/17 0801  GLUCAP 79 103* 156* 115* 73   Lipid Profile: No results for input(s): CHOL, [name], [name], [name], [name], LDLDIRECT in the last 72 hours. Thyroid Function Tests: No results for input(s): TSH, T4TOTAL, FREET4, T3FREE, THYROIDAB in the last 72 hours. Anemia Panel: No results for input(s): VITAMINB12, FOLATE, FERRITIN, TIBC, IRON, RETICCTPCT in the last 72 hours. Sepsis Labs: Recent Labs  Lab 05/04/17 1642  PROCALCITON <0.10  LATICACIDVEN 2.6*    No results found for this or any previous visit (from the past 240 hour(s)).       Radiology Studies: No results found.      Scheduled Meds: . acyclovir  400 mg Oral TID  . chlorhexidine  15 mL Mouth Rinse BID  . clopidogrel  75 mg Oral QHS  . clotrimazole   Topical BID  . cloZAPine  300 mg Oral QHS  . colchicine  0.3 mg Oral QHS  . diltiazem  240 mg Oral Daily  . fluconazole  200 mg Oral Daily  . guaiFENesin  400 mg Oral TID  . heparin  5,000 Units Subcutaneous Q8H  . hydrocortisone   Rectal BID  . ipratropium-albuterol  3 mL Inhalation TID  . isosorbide mononitrate  20 mg Oral BID  . levothyroxine  75 mcg Oral QAC breakfast  . mouth rinse  15 mL Mouth Rinse q12n4p  . metoprolol tartrate  50 mg Oral BID  . mometasone-formoterol  2 puff Inhalation BID  . mupirocin ointment   Topical BID  . pantoprazole  40 mg Oral Daily  . polyethylene glycol  17 g Oral BID  . pravastatin  20 mg Oral QHS  . predniSONE  10 mg Oral Q breakfast  . saccharomyces boulardii  250 mg Oral BID  . senna-docusate  2 tablet Oral BID  . sucralfate  1 g Oral TID WC & HS  . tamsulosin  0.4 mg Oral Q breakfast   Continuous Infusions: . sodium chloride Stopped (05/10/17 0107)     LOS: 16 days  Aline August, MD [facility] Pager [phone]  If 7PM-7AM, please contact night-coverage [email or web address] Password [facility] 05/10/2017, 10:21 AM

## 2017-05-10 NOTE — Progress Notes (Signed)
Patient might need home hospice. Daughter was hoping to meet with [facility] 11/27 in preparation for home. CM spoke and confirmed with daughter plan is to d/c to home with hospice care.[facility] selected per daughter. Referral made per CM to [facility] with [facility]unning @ [phone]. Whitman Hero RN,BSN,CM

## 2017-05-10 NOTE — Progress Notes (Addendum)
Occupational Therapy Treatment Patient Details Name: Rebecca [name] MRN: [id number] DOB: 1941-05-02 Today's Date: 05/10/2017    History of present illness Pt is a 76 y.o. female admitted on 04/24/17 with workup for sepsis secondary to HCAP and acute hypoxemic respiratory failure. Pertinent PMH includes COPD, CAD, HTN, CKD IV, diastolic HF, polymyalgia rheumatica, Chronic hip pain secondary to L5-S1 spondylosis, right trochanteric bursitis (admitted 03/14/17 for hip pain; steroid injection on 03/24/17 with Dr. Ninfa Linden). Of note, recently admitted 10/1-10/13 for HCAP and treated with vanco/zosyn.   OT comments  Pt progressing towards goals, requires Max encouragement to participate in EOB/OOB activity this session. Pt continues to remain fearful of falling, completed transfer to recliner using Stedy with [place] +2, completing grooming ADLs seated EOB with MinGuard for safety prior to transfer. Pt currently requires Max-total assist for toileting and LB ADLs. Based upon Pt's current level of assist, feel Pt would benefit from SNF level therapy services after discharge to maximize Pt's safety and independence with ADLs and mobility as well as to decrease level of caregiver assist after return home. Will continue to follow acutely.    Follow Up Recommendations  SNF;Supervision/Assistance - 24 hour;Other (comment)(recommend HHOT if returning home )    Equipment Recommendations  None recommended by OT          Precautions / Restrictions Precautions Precautions: Fall Restrictions Weight Bearing Restrictions: No       Mobility Bed Mobility Overal bed mobility: Needs Assistance Bed Mobility: Supine to Sit     Supine to sit: Mod assist;+2 for physical assistance;+2 for safety/equipment     General bed mobility comments: Pt able to initiate bringing LEs over EOB with verbal cues, assist to scoot hips towards EOB, +2 MinA for bringing trunk into upright position   Transfers Overall transfer  level: Needs assistance Equipment used: Ambulation equipment used Transfers: Stand Pivot Transfers;Sit to/from Stand Sit to Stand: Mod assist;+2 physical assistance Stand pivot transfers: Total assist;+2 safety/equipment;+2 physical assistance       General transfer comment: Pt completed sit<>stand at [facility] with ModA +2, able to stand from [place] with MinA +2, with assist for controlled descent to recliner     Balance Overall balance assessment: Needs assistance Sitting-balance support: No upper extremity supported Sitting balance-Leahy Scale: Fair Sitting balance - Comments: able to maintain static sitting    Standing balance support: Bilateral upper extremity supported Standing balance-Leahy Scale: Poor Standing balance comment: Min assist to maintain balance along with UE support                           ADL either performed or assessed with clinical judgement   ADL Overall ADL's : Needs assistance/impaired     Grooming: Set up;Min guard;Sitting Grooming Details (indicate cue type and reason): Pt applying lotion with setup assist and minGuard for sitting balance at EOB         Upper Body Dressing : Minimal assistance;Sitting Upper Body Dressing Details (indicate cue type and reason): doffing/donning new gown    Lower Body Dressing Details (indicate cue type and reason): Pt requiring total assist to adjust socks this session sitting in recliner      Toileting- Clothing Manipulation and Hygiene: Total assistance;+2 for safety/equipment;Sit to/from stand Toileting - Clothing Manipulation Details (indicate cue type and reason): Pt noted to have incontinent BM upon standing from EOB, total assist for pericare with additional assist to steady in standing      Functional mobility  during ADLs: Moderate assistance;+2 for physical assistance;+2 for safety/equipment General ADL Comments: Pt requires max encouragement for participation in session, completed transfer EOB  to chair with use of Stedy and +2 assist; Pt completing seated grooming ADLs prior to transfer                       Cognition Arousal/Alertness: Awake/alert Behavior During Therapy: [facility] for tasks assessed/performed;Anxious;Agitated Overall Cognitive Status: History of cognitive impairments - at baseline Area of Impairment: Attention;Memory;Following commands;Safety/judgement;Problem solving                   Current Attention Level: Selective Memory: Decreased short-term memory Following Commands: Follows multi-step commands with increased time Safety/Judgement: Decreased awareness of deficits Awareness: Intellectual Problem Solving: Requires verbal cues General Comments: Pt with fluctuating behavior during session, requires increased time and encouragement                    General Comments Pt cotinues to be fearful of falling, requires max encouragement for EOB/OOB activity; Pt's daughter planning to inquire with hospice about availability of mobility equipment Charlaine Dalton)     Pertinent Vitals/ Pain       Pain Assessment: Faces Faces Pain Scale: Hurts a little bit Pain Location: R hip and bottom Pain Descriptors / Indicators: Sore Pain Intervention(s): Limited activity within patient's tolerance;Monitored during session                                                          Frequency  Min 2X/week        Progress Toward Goals  OT Goals(current goals can now be found in the care plan section)  Progress towards OT goals: Progressing toward goals  Acute Rehab OT Goals Patient Stated Goal: get transfers as independent as possible to go home OT Goal Formulation: With family Time For Goal Achievement: 05/10/17 Potential to Achieve Goals: Good  Plan Discharge plan needs to be updated;Frequency remains appropriate                     AM-PAC PT "6 Clicks" Daily Activity     Outcome Measure   Help from another person  eating meals?: A Lot Help from another person taking care of personal grooming?: A Lot Help from another person toileting, which includes using toliet, bedpan, or urinal?: A Lot Help from another person bathing (including washing, rinsing, drying)?: A Lot Help from another person to put on and taking off regular upper body clothing?: A Lot Help from another person to put on and taking off regular lower body clothing?: Total 6 Click Score: 11    End of Session Equipment Utilized During Treatment: Gait belt;Other (comment)(stedy )  OT Visit Diagnosis: Other abnormalities of gait and mobility (R26.89);Muscle weakness (generalized) (M62.81);Other symptoms and signs involving cognitive function   Activity Tolerance Patient tolerated treatment well   Patient Left in chair;with call bell/phone within reach;with family/visitor present   Nurse Communication Mobility status;Other (comment)(need for new pure wick (NT))        Time: 6073-7106 OT Time Calculation (min): 30 min  Charges: OT General Charges $OT Visit: 1 Visit OT Treatments $Self Care/Home Management : 23-37 mins  Lou Cal, [place] Pager [phone] 05/10/2017    Raymondo Band 05/10/2017, 2:01 PM

## 2017-05-10 NOTE — Progress Notes (Signed)
Daily Progress Note   Patient Name: Rebecca [name]       Date: 05/10/2017 DOB: 09/21/1940  Age: 76 y.o. MRN#: [id number] Attending Physician: Aline August, MD Primary Care Physician: [name], MD Admit Date: 04/24/2017  Reason for Consultation/Follow-up: Establishing goals of care  Subjective: Rebecca [name] has just returned back to bed. No complaints at this time. Looking forward to returning home.   Length of Stay: 16  Current Medications: Scheduled Meds:  . acyclovir  400 mg Oral TID  . chlorhexidine  15 mL Mouth Rinse BID  . clopidogrel  75 mg Oral QHS  . clotrimazole   Topical BID  . cloZAPine  300 mg Oral QHS  . colchicine  0.3 mg Oral QHS  . diltiazem  240 mg Oral Daily  . fluconazole  200 mg Oral Daily  . guaiFENesin  400 mg Oral TID  . hydrocortisone   Rectal BID  . ipratropium-albuterol  3 mL Inhalation TID  . isosorbide mononitrate  20 mg Oral BID  . levothyroxine  75 mcg Oral QAC breakfast  . mouth rinse  15 mL Mouth Rinse q12n4p  . metoprolol tartrate  50 mg Oral BID  . mometasone-formoterol  2 puff Inhalation BID  . mupirocin ointment   Topical BID  . pantoprazole  40 mg Oral Daily  . polyethylene glycol  17 g Oral BID  . pravastatin  20 mg Oral QHS  . predniSONE  10 mg Oral Q breakfast  . saccharomyces boulardii  250 mg Oral BID  . senna-docusate  2 tablet Oral BID  . sucralfate  1 g Oral TID WC & HS  . tamsulosin  0.4 mg Oral Q breakfast    Continuous Infusions: . sodium chloride Stopped (05/10/17 0107)    PRN Meds: acetaminophen **OR** acetaminophen, bisacodyl, fluticasone, gi cocktail, lip balm, morphine injection, ondansetron **OR** ondansetron (ZOFRAN) IV, RESOURCE THICKENUP CLEAR, traMADol  Physical Exam         Constitutional: She is  oriented to person, place, and time. She appears well-developed. She appears lethargic. She appears ill.  Frail, elderly  HENT:  Head: Normocephalic and atraumatic.  Cardiovascular: Normal rate and regular rhythm.  Pulmonary/Chest: Effort normal. No accessory muscle usage. No tachypnea. No respiratory distress.  Abdominal: Soft. Normal appearance.  Neurological: She appears more alert today.  Skin:  Extensive bruising to bilat arms (chronic steroid use + anticoagulation). Skin thin and frail.   Nursing note and vitals reviewed.    Vital Signs: BP (!) 115/51 (BP Location: Left Arm)   Pulse 70   Temp 98.2 F (36.8 C) (Oral)   Resp 18   Ht 5' 3"  (1.6 m)   Wt 64.6 kg (142 lb 6.7 oz)   SpO2 98%   BMI 25.23 kg/m  SpO2: SpO2: 98 % O2 Device: O2 Device: Not Delivered O2 Flow Rate: O2 Flow Rate (L/min): 3 L/min  Intake/output summary:   Intake/Output Summary (Last 24 hours) at 05/10/2017 1508 Last data filed at 05/10/2017 1409 Gross per 24 hour  Intake 120 ml  Output 660 ml  Net -540 ml   LBM: Last BM Date: 05/10/17 Baseline Weight: Weight: 59.9 kg (132 lb) Most recent weight: Weight: 64.6 kg (142 lb 6.7 oz)       Palliative Assessment/Data: 30%   Flowsheet Rows     Most Recent Value  Intake Tab  Referral Department  Hospitalist  Unit at Time of Referral  Intermediate Care Unit  Palliative Care Primary Diagnosis  Neurology  Date Notified  04/29/17  Palliative Care Type  Return patient Palliative Care  Reason for referral  Clarify Goals of Care  Date of Admission  04/24/17  Date first seen by Palliative Care  04/29/17  # of days Palliative referral response time  0 Day(s)  # of days IP prior to Palliative referral  5  Clinical Assessment  Psychosocial & Spiritual Assessment  Palliative Care Outcomes      Patient Active Problem List   Diagnosis Date Noted  . Poor appetite   . Acute esophagitis   . Poor fluid intake   . Dysphagia 04/27/2017  . Pulmonary  infiltrates c/w recurrent aspiration pneumonitis vs hcap 03/30/2017  . Prerenal azotemia 03/30/2017  . Dyspnea on exertion 03/29/2017  . Weakness   . Hypervolemia   . Lethargy   . Pain of right hip joint   . Acute right hip pain 03/14/2017  . Physical debility 03/08/2017  . Recurrent UTI 03/08/2017  . Ascites 03/08/2017  . Chronic pain disorder 03/07/2017  . Chronic constipation   . Community acquired pneumonia   . Palliative care encounter   . Goals of care, counseling/discussion   . TIA (transient ischemic attack) 01/27/2017  . Pancreatic mass 01/27/2017  . Aortic atherosclerosis ([place]) 01/27/2017  . Hyponatremia 01/25/2017  . Generalized weakness 01/25/2017  . Fatigue 01/11/2017  . Mixed hyperlipidemia 12/02/2016  . Encephalopathy 11/20/2016  . Altered mental status 11/19/2016  . Aspiration pneumonia of both lower lobes due to gastric secretions ([place]) 10/10/2016  . Aspiration pneumonia ([place]) 10/07/2016  . Lobar pneumonia, unspecified organism ([place]) 10/07/2016  . Ileus ([place]) 10/07/2016  . AKI (acute kidney injury) ([place]) 09/12/2016  . HAP (hospital-acquired pneumonia) 09/12/2016  . Respiratory distress 09/12/2016  . Pressure injury of skin 07/15/2016  . Essential hypertension 07/14/2016  . Acute encephalopathy 07/14/2016  . Coronary artery disease involving native coronary artery of native heart without angina pectoris 05/14/2016  . CKD stage 4, GFR 15-29 ml/min  08/11/2015  . 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 Leukocytosis 01/28/2014  . History of TIA - May 2015- Plavix 10/16/2013  . Polymyalgia rheumatica ([place]) 10/03/2012  . Chronic diastolic (congestive) heart failure ([place]) 07/15/2012  . Trochanteric bursitis of right hip 04/11/2012  . Dysuria 08/30/2011  . History of small bowel obstruction 11/04/2010  .  Schizoaffective disorder ([place]) 01/10/2008  . Dyslipidemia 01/25/2007  . Hypothyroidism 08/11/2006  . Anemia in chronic renal  disease 08/11/2006  . GASTROESOPHAGEAL REFLUX, NO ESOPHAGITIS 08/11/2006    Palliative Care Assessment & Plan   HPI: 76 y.o. female  with past medical history of COPD, PMR on chronic prednisone, CAD, diastolic CHF, CKD stage III, MGUS, recent admission to [facility] with right-sided trochanteric bursitis and went to [facility] for rehab but had returned home recently with daughter. Was admitted on 04/24/2017 with lethargy and fever r/t UTI and aspiration pneumonia. Has struggled with worsening dysphagia and have proceeded with temporary Cortrak placement to allow for rest for esophagitis and SLP will reassess swallow function after the weekend. MBS scheduled for 1300 05/03/17. Intake tolerance fluctuating but is taking in more fluids and eating up to half her meals at times.   Assessment: I met today with [name] privately first. [name] is upset and concerned about a comment from another family member about taking her mother home. We further discussed her feelings and concerns about going home and options with hospice. We also discussed possibility of going from home with hospice to hospice facility if needed. [name] feels better and their desire is to return home with the assistance of hospice. She requests [facility]. Discussed with [facility].   We also completed new MOST form with updated goals: DNR, comfort measures with no return to the hospital, discuss the benefits/limitations of antibiotics when infection occurs, no IVF, no feeding tube. Ms. Cullinane and [name] feel comfortable and at peace with these decisions. Emotional support provided.   Recommendations/Plan:  Cortrak out now. Does not desire any further artificial nutrition or hydration.   MBS done -  Dys I, honey thick fluids. Intake poor.   Added carafate TID. GI cocktail BID prn. Receiving Diflucan.   Constipation: Dulcolax supp x 1 and daily prn.   Hemorrhoids: Hydrocortisone 2.5% rectal cream BID.   Code  Status:  DNR  Prognosis:  Weeks to months. < 6 months. Depends on toleration of intake.   Discharge Planning:  Home with hospice   Thank you for allowing the Palliative Medicine Team to assist in the care of this patient.   Total Time 50 min Prolonged Time Billed  no       Greater than 50%  of this time was spent counseling and coordinating care related to the above assessment and plan.  Vinie Sill, NP Palliative Medicine Team Pager # [phone] (M-F 8a-5p) Team Phone # [phone] (Nights/Weekends)

## 2017-05-10 NOTE — Progress Notes (Signed)
Consulted IV team after it was noted that current IV was leaking.  Pt and family refused new [place] notified.  Will continue to monitor.

## 2017-05-10 NOTE — Progress Notes (Signed)
CSW notes that patient is discharging home with hospice.   CSW signing off.  Rebecca [name] LCSWA [phone]

## 2017-05-11 LAB — GLUCOSE, CAPILLARY
GLUCOSE-CAPILLARY: 76 mg/dL (ref 65–99)
GLUCOSE-CAPILLARY: 118 mg/dL — AB (ref 65–99)

## 2017-05-11 MED ORDER — ACYCLOVIR 400 MG PO TABS: 400.0000 mg | ORAL_TABLET | Freq: Three times a day (TID) | ORAL | 0 refills | Status: AC

## 2017-05-11 MED ORDER — MORPHINE SULFATE (CONCENTRATE) 10 MG/0.5ML PO SOLN: 5.0000 mg | ORAL | Status: DC | PRN

## 2017-05-11 MED ORDER — HYDROCORTISONE 2.5 % RE CREA: TOPICAL_CREAM | Freq: Two times a day (BID) | RECTAL | 0 refills | Status: AC

## 2017-05-11 MED ORDER — CHLORHEXIDINE GLUCONATE 0.12 % MT SOLN: 15.0000 mL | Freq: Two times a day (BID) | OROMUCOSAL | 0 refills | Status: AC

## 2017-05-11 MED ORDER — CLOTRIMAZOLE 1 % EX CREA: TOPICAL_CREAM | Freq: Two times a day (BID) | CUTANEOUS | 0 refills | Status: AC

## 2017-05-11 MED ORDER — FLUCONAZOLE 100 MG PO TABS: 200.0000 mg | ORAL_TABLET | Freq: Every day | ORAL | 0 refills | Status: AC

## 2017-05-11 MED ORDER — MORPHINE SULFATE (CONCENTRATE) 10 MG/0.5ML PO SOLN: 5.0000 mg | ORAL | 0 refills | Status: AC | PRN

## 2017-05-11 MED ORDER — TRAMADOL HCL 50 MG PO TABS
50.0000 mg | ORAL_TABLET | Freq: Once | ORAL | Status: AC | PRN
  Administered 2017-05-11: 50 mg via ORAL
  Filled 2017-05-11: qty 1

## 2017-05-11 MED ORDER — BISACODYL 10 MG RE SUPP: 10.0000 mg | Freq: Every day | RECTAL | 0 refills | Status: AC | PRN

## 2017-05-11 MED ORDER — ONDANSETRON HCL 4 MG PO TABS: 4.0000 mg | ORAL_TABLET | Freq: Three times a day (TID) | ORAL | 0 refills | Status: AC | PRN

## 2017-05-11 NOTE — Consult Note (Signed)
   [facility] CM Inpatient Consult   05/11/2017  Rebecca [name] [birth year] [id number]    [facility] Care Management follow up.  Went to bedside to speak with daughter, [name]. However, she was not in the room.  Chart reviewed and noted discharge plan is for home with hospice. Confirmed with inpatient RNCM as well.  Will make [place] team aware of disposition plans.   Marthenia Rolling, MSN-Ed, RN,BSN [facility] Liaison [phone]

## 2017-05-11 NOTE — Progress Notes (Addendum)
Pt transferred to [facility] unit. Report called and all questions answered. PTAR set up for 1800.

## 2017-05-11 NOTE — Progress Notes (Signed)
Rebecca [name] to be D/C'd Home per MD order.  Discussed with the patient and all questions fully answered.  An After Visit Summary was printed and given to the patient. Patient received prescription.  D/c education completed with patient/family including follow up instructions, medication list, d/c activities limitations if indicated, with other d/c instructions as indicated by MD - patient able to verbalize understanding, all questions fully answered.   Pt is waiting for family to D/C. Will continue to assess.  Christoper Fabian [name] 05/11/2017 2:34 PM

## 2017-05-11 NOTE — Progress Notes (Signed)
Nutrition Brief Note  Chart reviewed.   11/20- s/p MBSS- advanced to dysphagia 1 diet with honey thick liquids 11/23- cortrak tube d/c  Case discussed with Vinie Sill, NP, of Palliative Care team. She reports that pt will likely go home with hospice services today; plan for comfort measures and comfort feeding. She confirms that no further nutrition interventions warranted at this time.  Please re-consult as needed.   Kingslee Dowse A. Jimmye Norman, RD, LDN, CDE Pager: [phone] After hours Pager: [phone]

## 2017-05-11 NOTE — Care Management Note (Addendum)
Case Management Note  Patient Details  Name: Rebecca [name] MRN: [id number] Date of Birth: 08-30-1940  Subjective/Objective:           Admitted with Aspiration PNA,  history of COPD, PMR on chronic prednisone 10 MG daily, CAD status post DES to RCA 2006, stage III CKD, chronic diastolic CHF, MGUS, hypothyroid, recently hospitalized.   PCP: [name]  Action/Plan: Plan is to d/c to home today with hospice care. Pt  will need PTAR called  @ [phone] for pt pickup / transportation to home. Daughter states she has a  MD appointment in [place] and she will not be able to receive pt until 6pm. CM spoke with Margret Chance Greggory Brandy [phone]), and was told the pt's initial assessment is scheduled for 05/12/2017 @ 10 am.  Expected Discharge Date:  04/29/17               Expected Discharge Plan:  Home w Hospice Care  In-House Referral:  Clinical Social Work  Discharge planning Services  CM Consult  Post Acute Care Choice:    Choice offered to:  Adult Children  DME Arranged:    DME Agency:     HH Arranged:    Home Hospice HH Agency:  [place],   Status of Service:  Completed, signed off  If discussed at [place] of Stay Meetings, dates discussed:    Additional Comments:  Sharin Mons, RN 05/11/2017, 11:49 AM

## 2017-05-11 NOTE — Discharge Summary (Signed)
Physician Discharge Summary  Rebecca [name] JQB:[id number] DOB: 1941/05/24 DOA: 04/24/2017  PCP: [name], MD  Admit date: 04/24/2017 Discharge date: 05/11/2017  Admitted From: Home Disposition: Home with hospice  Recommendations for Outpatient Follow-up:  1. Follow up with PCP in 1-2 weeks 2. Follow-up with home hospice at earliest [place]: Home hospice Equipment/Devices: None  Discharge Condition: Poor CODE STATUS: DNR Diet recommendation: Heart Healthy /as per SLP recommendations  Brief/Interim Summary: 76 year old female with history of COPD, PMR on chronic prednisone 10 MG daily, CAD status post DES to RCA 2006, stage III CKD, chronic diastolic CHF, MGUS, hypothyroid, recent hospitalization for right trochanteric bursitis, acute on chronic diastolic CHF and HCAP, discharged to SNF and subsequently home, treated for presumed UTI with Ceftin at discharge from SNF, presented with lethargy, fever, cough productive of yellow sputum and vomiting. Admitted for LLL pneumonia suspicious for aspiration pneumonia. Failed swallow evaluation on 11/14. Concern for esophageal dysmotility, [facility] consulted and workup completed. diet per speech therapy ordered but very poor oral intake and at risk for aspiration. Therefore placed cor track for temporary tube feeding on 11/16. Swallow eval repeated patient diet was advanced to dysphagia 1. Oral candida was noted; patient was stated on diflucan; now swallowing improving. Patient developed herpes labialis which has been treated with acyclovir and improving.palliative care following.  Overall prognosis is guarded to poor.  Patient and family members have agreed for patient to be discharged home with hospice.  If general health deteriorates, consider discontinuing all the medications and focus on full comfort.    Discharge Diagnoses:  Principal Problem:   Aspiration pneumonia ([place]) Active Problems:   Hypothyroidism    Schizoaffective disorder ([place])   GASTROESOPHAGEAL REFLUX, NO ESOPHAGITIS   Polymyalgia rheumatica (HCC)   Essential hypertension   Lobar pneumonia, unspecified organism (HCC)   TIA (transient ischemic attack)   Goals of care, counseling/discussion   Recurrent UTI   Dysphagia   Acute esophagitis   Poor fluid intake   Poor appetite   Sepsis secondary to aspiration pneumonia - Resolved. Currently off antibiotics  Acute respiratory failure with hypoxia due to aspiration pneumonia - Resolved. Patient is off oxygen.  Dysphagia - Continue diet as per SLP recommendations -If condition worsens, patient should be given comfort food.  Probable oral/esophageal candidiasis - Continue empiric Diflucan for 5 more days. Diet and appetite slightly improving as per the patient and family member. - Patient would not want NG tube feeding or PEG tube feeding. Patient opted for conservative treatment for now; would not consider EGD.  Leukocytosis - Resolved  Herpes labialis - Improving.  Finish a course of acyclovir for 7-10 days.  UTI with Enterobacter -Resolved. Treated with merrem  Acute and chronic kidney disease stage III - Creatinine at baseline.  Would not order labs as an outpatient as well as patient is going home with hospice  PMR on chronic prednisone - Continue prednisone  Hypertension -BP stable. Continue current regimen  Hypothyroidism - Continue current medication  Chronic Pain - Continue colchicine and prednisone  Generalized deconditioning/goals of care - Patient is very deconditioned and has guarded/poor outcome. Palliative care following. Patient has agreed for discharge home with home hospice.  Discharge home today.  If condition gets worse, current medications can be discontinued and a full comfort measures can be started.  Discharge Instructions  Discharge Instructions    Diet - low sodium heart healthy   Complete by:  As directed    Discharge  instructions  Complete by:  As directed    Diet as per SLP recommendations   Increase activity slowly   Complete by:  As directed      Allergies as of 05/11/2017      Reactions   Levaquin [levofloxacin] Other (See Comments)   Ruptured tendon   Tape Other (See Comments)   PATIENT'S SKIN IS VERY, VERY THIN AND WILL TEAR AND BRUISE EASILY; PLEASE USE AN ALTERNATIVE (SOMETHING OTHER THAN TAPE)      Medication List    TAKE these medications   acetaminophen 500 MG tablet Commonly known as:  TYLENOL Take 1 tablet (500 mg total) by mouth every 6 (six) hours as needed (pain). What changed:    how much to take  when to take this   acyclovir 400 MG tablet Commonly known as:  ZOVIRAX Take 1 tablet (400 mg total) by mouth 3 (three) times daily for 5 days.   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   bisacodyl 10 MG suppository Commonly known as:  DULCOLAX Place 1 suppository (10 mg total) rectally daily as needed for moderate constipation.   cetirizine 10 MG tablet Commonly known as:  ZYRTEC Take 10 mg by mouth at bedtime.   chlorhexidine 0.12 % solution Commonly known as:  PERIDEX 15 mLs by Mouth Rinse route 2 (two) times daily.   clopidogrel 75 MG tablet Commonly known as:  PLAVIX Take 1 tablet (75 mg total) by mouth daily with breakfast. What changed:  when to take this   clotrimazole 1 % cream Commonly known as:  LOTRIMIN Apply topically 2 (two) times daily. Apply to under left breast. May also apply under right breast as needed   cloZAPine 100 MG tablet Commonly known as:  CLOZARIL Take 300 mg by mouth at bedtime.   colchicine 0.6 MG tablet Take 0.6 mg See admin instructions by mouth. Take 1 tablet (0.6 mg) by mouth at bedtime with food 11/9 thru 11/13 - then give results to Dr. Leigh Aurora when called   diltiazem 240 MG 24 hr capsule Commonly known as:  CARDIZEM  CD Take 240 mg daily with breakfast by mouth.   fluconazole 100 MG tablet Commonly known as:  DIFLUCAN Take 2 tablets (200 mg total) by mouth daily for 5 days.   fluticasone 50 MCG/ACT nasal spray Commonly known as:  FLONASE Place 2 sprays daily as needed into both nostrils (seasonal allergies).   Fluticasone-Salmeterol 250-50 MCG/DOSE Aepb Commonly known as:  ADVAIR Inhale 1 puff into the lungs at bedtime.   furosemide 20 MG tablet Commonly known as:  LASIX Take 20 mg daily as needed by mouth (weight gain of 3 lbs in 24 hours).   guaifenesin 400 MG Tabs tablet Commonly known as:  HUMIBID E Take 400 mg 3 (three) times daily by mouth.   hydrocortisone 2.5 % rectal cream Commonly known as:  ANUSOL-HC Place rectally 2 (two) times daily.   ipratropium-albuterol 0.5-2.5 (3) MG/3ML Soln Commonly known as:  DUONEB Inhale 3 mLs into the lungs 3 (three) times daily.   isosorbide mononitrate 60 MG 24 hr tablet Commonly known as:  IMDUR Take 1 tablet (60 mg total) by mouth daily. What changed:  when to take this   levothyroxine 75 MCG tablet Commonly known as:  SYNTHROID, LEVOTHROID Take 75 mcg by mouth daily before breakfast.   metoprolol succinate 100 MG 24 hr tablet Commonly  known as:  TOPROL-XL Take 100 mg by mouth daily with breakfast. Take with or immediately following a meal.   morphine CONCENTRATE 10 MG/0.5ML Soln concentrated solution Take 0.25 mLs (5 mg total) by mouth every 3 (three) hours as needed for severe pain or shortness of breath (air hunger).   multivitamin with minerals Tabs tablet Take 1 tablet daily with supper by mouth.   nitroGLYCERIN 0.4 MG SL tablet Commonly known as:  NITROSTAT Place 1 tablet (0.4 mg total) under the tongue every 5 (five) minutes as needed. For chest pain What changed:    reasons to take this  additional instructions   ondansetron 4 MG tablet Commonly known as:  ZOFRAN Take 1 tablet (4 mg total) by mouth every 8 (eight)  hours as needed for nausea or vomiting.   pantoprazole 40 MG tablet Commonly known as:  PROTONIX Take 1 tablet (40 mg total) by mouth daily. What changed:  when to take this   polyethylene glycol powder powder Commonly known as:  GLYCOLAX/MIRALAX Take 17 g by mouth 2 (two) times daily. MIX AND DRINK What changed:  additional instructions   pravastatin 20 MG tablet Commonly known as:  PRAVACHOL Take 20 mg by mouth at bedtime.   predniSONE 10 MG tablet Commonly known as:  DELTASONE Take 1 tablet (10 mg total) by mouth daily with breakfast.   PROBIOTIC PO Take 1 capsule daily with breakfast by mouth.   SALONPAS PAIN RELIEF PATCH EX Place 1 patch See admin instructions onto the skin. Apply one patch to lower back and one patch to hip every morning as needed for pain, remove after 8 hours   SENNA-S 8.6-50 MG tablet Generic drug:  senna-docusate Take 2 tablets at bedtime by mouth.   SM CRANBERRY 300 MG tablet Generic drug:  Cranberry Take 300 mg daily with breakfast by mouth.   SPIRIVA RESPIMAT 2.5 MCG/ACT Aers Generic drug:  Tiotropium Bromide Monohydrate Inhale 2 puffs daily into the lungs.   tamsulosin 0.4 MG Caps capsule Commonly known as:  FLOMAX Take 1 capsule (0.4 mg total) by mouth daily. What changed:  when to take this   traMADol 50 MG tablet Commonly known as:  ULTRAM Take 50 mg See admin instructions by mouth. Take 1 tablet (50 mg) by mouth daily with breakfast, may also take 1 tablet (50 mg) two more times daily as needed for pain   TraMADol HCl 300 MG Cp24 Take 300 mg daily as needed by mouth (pain from physical therapy).   Vitamin D (Ergocalciferol) 50000 units Caps capsule Commonly known as:  DRISDOL Take 1 capsule (50,000 Units total) by mouth every 7 (seven) days. What changed:  when to take this      [facility] Follow up.   Contact information: [place] [address] [phone]           Allergies  Allergen Reactions  . Levaquin [Levofloxacin] Other (See Comments)    Ruptured tendon  . Tape Other (See Comments)    PATIENT'S SKIN IS VERY, VERY THIN AND WILL TEAR AND BRUISE EASILY; PLEASE USE AN ALTERNATIVE (SOMETHING OTHER THAN TAPE)    Consultations:  GI/palliative care   Procedures/Studies: Dg Abd 1 View  Result Date: 05/02/2017 CLINICAL DATA:  Confirm NG tube placement EXAM: ABDOMEN - 1 VIEW COMPARISON:  04/29/2017 FINDINGS: Enteric tube is coiled in the left upper quadrant consistent with location in the body of the stomach. The tip is  directed superiorly, likely extending to the upper stomach. Diffusely stool-filled colon. Cardiac enlargement. Calcification of the aorta. Degenerative changes in the spine. Surgical clips in the right upper quadrant. Old right rib fractures. IMPRESSION: Enteric tube tip is coiled in the left upper quadrant consistent with location in the body of the stomach with tip extending to the upper stomach. Electronically Signed   By: Lucienne Capers M.D.   On: 05/02/2017 21:26   Dg Esophagus  Result Date: 04/28/2017 CLINICAL DATA:  Difficulty swallowing. EXAM: ESOPHOGRAM/BARIUM SWALLOW TECHNIQUE: Single contrast examination was performed using  water-soluble. FLUOROSCOPY TIME:  Fluoroscopy Time:  1 minutes 12 seconds Radiation Exposure Index (if provided by the fluoroscopic device): 9.7 mGy COMPARISON:  None. FINDINGS: The entire examination was performed in the semi upright position. There was normal pharyngeal anatomy and motility. Contrast flowed freely through the esophagus without evidence of stricture or mass. There was normal esophageal mucosa without evidence of irregularity or ulceration. Esophageal motility was normal. No evidence of reflux. No definite hiatal hernia was demonstrated. IMPRESSION: Normal single contrast barium swallow.  No esophageal stricture. Electronically Signed   By: Kathreen Devoid   On: 04/28/2017 10:18   Dg  Chest Port 1 View  Result Date: 05/06/2017 CLINICAL DATA:  Cough. EXAM: PORTABLE CHEST 1 VIEW COMPARISON:  Radiograph 05/04/2017.  CT 03/21/2017 FINDINGS: Enteric tube is been removed. Elevated left hemidiaphragm with persistent confluent left basilar opacity and possible small pleural effusion. Minimal streaky right basilar atelectasis. Unchanged heart size and mediastinal contours with dense aortic atherosclerosis. Bronchovascular crowding related to lower lung volumes, no convincing pulmonary edema. No pneumothorax. IMPRESSION: Unchanged left opacity from recent exam, likely combination of consolidation and pleural effusion. Electronically Signed   By: Jeb Levering M.D.   On: 05/06/2017 20:19   Dg Chest Port 1 View  Result Date: 05/04/2017 CLINICAL DATA:  Hypoxia. EXAM: PORTABLE CHEST 1 VIEW COMPARISON:  04/24/2017 FINDINGS: Patient is partially rotated to the left. Heart size is stable. Aortic atherosclerosis. Confluent left lower lung opacity is mildly increased since previous study, and may be due to increased pleural effusion and/or pulmonary infiltrate or atelectasis. Right lung remains clear. No evidence of pneumothorax. A nasogastric tube is seen with tip overlying the distal thoracic esophagus, just above the GE junction. IMPRESSION: Increased inflow left lower lung opacity, which may be due to increased pleural effusion, pulmonary infiltrate, and/or atelectasis. Nasogastric tube tip overlies the distal esophagus, just above the GE junction. Electronically Signed   By: Earle Gell M.D.   On: 05/04/2017 20:30   Dg Chest Portable 1 View  Result Date: 04/24/2017 CLINICAL DATA:  Altered mental status. EXAM: PORTABLE CHEST 1 VIEW COMPARISON:  CT of the chest 03/21/2017 FINDINGS: Enlarged cardiac silhouette. Calcific atherosclerotic disease and tortuosity of the aorta. Left lower lobe airspace consolidation and moderate left pleural effusion. Low lung volumes. Osseous structures are without  acute abnormality. Soft tissues are grossly normal. IMPRESSION: Left lower lobe airspace consolidation/ pleural effusion. Electronically Signed   By: Fidela Salisbury M.D.   On: 04/24/2017 15:34   Dg Abd Portable 1v  Result Date: 04/29/2017 CLINICAL DATA:  76 year old female with enteric tube placement. EXAM: PORTABLE ABDOMEN - 1 VIEW COMPARISON:  Abdominal radiograph dated 04/27/2017 FINDINGS: Partially visualized enteric tube with tip and side-port in the left upper abdomen likely within the stomach. The tube courses into the body of the stomach and turns round with tip likely in the gastric fundus. Large amount of stool noted throughout the colon.  No bowel dilatation. Right upper quadrant cholecystectomy clips. Left lung base densities may represent atelectasis versus infiltrate. There is degenerative changes of the spine. IMPRESSION: 1. Partially visualized enteric tube with tip in the stomach. 2. Large amount of colonic stool burden.  No bowel dilatation. 3. Left lung base atelectasis versus infiltrate. Electronically Signed   By: Anner Crete M.D.   On: 04/29/2017 18:58   Dg Abd Portable 1v  Result Date: 04/27/2017 CLINICAL DATA:  Vomiting. EXAM: PORTABLE ABDOMEN - 1 VIEW COMPARISON:  Scout image for CT scan of the abdomen dated 01/16/2017 FINDINGS: There are no dilated loops of large or small bowel. The stomach is not distended. There is extensive stool in the right side of the colon with less prominent stool in the proximal descending colon. No evidence of fecal impaction. Chronic degenerative changes in the lumbar spine. IMPRESSION: No acute abnormality. Moderate stool in the colon, but less prominent than on the prior CT scan. Electronically Signed   By: Lorriane Shire M.D.   On: 04/27/2017 09:59   Dg Swallowing Func-speech Pathology  Result Date: 05/03/2017 Objective Swallowing Evaluation: Type of Study: MBS-Modified Barium Swallow Study  Patient Details Name: Rebecca [name] MRN:  [id number] Date of Birth: 23-Aug-1940 Today's Date: 05/03/2017 Time: SLP Start Time (ACUTE ONLY): 8182 -SLP Stop Time (ACUTE ONLY): 1327 SLP Time Calculation (min) (ACUTE ONLY): 20 min Past Medical History: Past Medical History: Diagnosis Date . AKI (acute kidney injury) ([place])  . Anemia in chronic renal disease  . Aortic atherosclerosis ([place]) 01/27/2017 . CAD (coronary artery disease) 2006  a. s/p DES to RCA in 2006 b. low-risk NST in 2014 c. cath in 08/2015 showing patent RCA stent with nonobstructive disease . CKD (chronic kidney disease) stage 4, GFR 15-29 ml/min (HCC)  . Complication of anesthesia  . COPD (chronic obstructive pulmonary disease) ([place])  . Diabetes mellitus  . Diastolic heart failure, NYHA class 1 ([place])  . Dyslipidemia  . Dysuria 01/06/2015 . Frozen shoulder   right . Gastroesophageal reflux  . HAP (hospital-acquired pneumonia)  . Headache  . Hyperkalemia 01/01/2016 . Hypothyroidism  . MGUS (monoclonal gammopathy of unknown significance) 01/28/2014 . Obesity  . Pancreatic mass 01/27/2017 . Pneumonia 06/2016 . Polymyalgia rheumatica ([place])  . PONV (postoperative nausea and vomiting)  . Renal insufficiency  . Respiratory distress  . Schizoaffective disorder ([place])  . Sepsis due to pneumonia ([place])  . Shortness of breath  . Stroke [facility])  Past Surgical History: Past Surgical History: Procedure Laterality Date . ABDOMINAL HYSTERECTOMY   . CARDIAC CATHETERIZATION N/A 08/13/2015  Procedure: Left Heart Cath and Coronary Angiography;  Surgeon: Jettie Booze, MD;  Location: [facility];  Service: Cardiovascular;  Laterality: N/A; . CATARACT EXTRACTION   . CHOLECYSTECTOMY   . CORONARY ANGIOPLASTY WITH STENT PLACEMENT  2006  [place] DES to RCA, 50 % residual . PARTIAL HYSTERECTOMY  1995 HPI: Chayla A Coxis a 76 y.o.femalewith medical history significant ofCOPD, pna,PMR, CAD, CVA, pna, GERD, pancreatic mass, barium esophagram revealing mild esophageal dysmotility, puree diet at baseline (thin liquids),  schizoaffective disorder, CKD stage IV, diastolic heart failure, MGUS, hypothyroidism who was most recently admitted at [facility] long hospital secondary to right-sided trochanteric bursitis, acute on chronic diastolic heart failure and healthcare acquired pneumonia. This admission found to have healthcare associated pna). MBS 09/2016 reveaked suspected primary esophageal deficits, pharyngeal residue, trace penetration thin to cords silent while consuming barium pill. Dys 3, thin recommended. Repeat MBS today for possible recommendation for liquids.  Subjective: Pt resting in recliner. Daughter present Assessment / Plan / Recommendation CHL IP CLINICAL IMPRESSIONS 05/03/2017 Clinical Impression Swallow function is slightly improved from prior MBS and SLP recommendating Dys 1 (puree) texture and initiation honey thick liquids via teaspoon only(prior MBS no liquids recommended). Pt's laryngeal elevation and incomplete epiglottic deflection resulted in mild laryngeal penetration with cup sips honey thick without pt sensation and consistent vallecular residue. Majority of the time she swallowed a second time which minimally decreased residue and kicked out penetrates x 1. Pt's esophagus was approximately 80% full with delayed emptying and pt requesting to stop after 4 bites (SLP able to encourge to complete MBS). Dys 1 recommended (pt's daughter prepares puree at home) and honey thick liquid via teaspoon only. She may have sips thin water after oral care. Pt will likey consume small amount of offered meals. Pt daughter reporting NGT may be taken out Friday appropriately as they are only a short term means of nutrition. Recommend pt remain upright 1 hour (if able) after meals, 2-3 swallows, volitional cough/throat clear during meals. Continue therapy to educate pt re: compensatory strategies. SLP will speak with daughter re: comfort liquids (thin) if this is appropriate in near future.  SLP Visit Diagnosis Dysphagia,  pharyngeal phase (R13.13) Attention and concentration deficit following -- Frontal lobe and executive function deficit following -- Impact on safety and function Moderate aspiration risk   CHL IP TREATMENT RECOMMENDATION 05/03/2017 Treatment Recommendations Therapy as outlined in treatment plan below   Prognosis 05/03/2017 Prognosis for Safe Diet Advancement Fair Barriers to Reach Goals Severity of deficits Barriers/Prognosis Comment -- CHL IP DIET RECOMMENDATION 05/03/2017 SLP Diet Recommendations Dysphagia 1 (Puree) solids;Honey thick liquids Liquid Administration via Spoon Medication Administration Via alternative means Compensations Minimize environmental distractions;Slow rate;Small sips/bites;Multiple dry swallows after each bite/sip;Clear throat after each swallow;Hard cough after swallow Postural Changes Remain semi-upright after after feeds/meals (Comment);Seated upright at 90 degrees   CHL IP OTHER RECOMMENDATIONS 05/03/2017 Recommended Consults -- Oral Care Recommendations Oral care BID Other Recommendations --   CHL IP FOLLOW UP RECOMMENDATIONS 05/03/2017 Follow up Recommendations 24 hour supervision/assistance   CHL IP FREQUENCY AND DURATION 05/03/2017 Speech Therapy Frequency (ACUTE ONLY) min 2x/week Treatment Duration 2 weeks      CHL IP ORAL PHASE 05/03/2017 Oral Phase Impaired Oral - Pudding Teaspoon -- Oral - Pudding Cup -- Oral - Honey Teaspoon Delayed oral transit Oral - Honey Cup Delayed oral transit Oral - Nectar Teaspoon -- Oral - Nectar Cup NT Oral - Nectar Straw -- Oral - Thin Teaspoon -- Oral - Thin Cup NT Oral - Thin Straw -- Oral - Puree Delayed oral transit Oral - Mech Soft -- Oral - Regular -- Oral - Multi-Consistency -- Oral - Pill -- Oral Phase - Comment --  CHL IP PHARYNGEAL PHASE 05/03/2017 Pharyngeal Phase Impaired Pharyngeal- Pudding Teaspoon -- Pharyngeal -- Pharyngeal- Pudding Cup -- Pharyngeal -- Pharyngeal- Honey Teaspoon Reduced laryngeal elevation;Reduced epiglottic  inversion;Penetration/Aspiration during swallow;Reduced anterior laryngeal mobility;Reduced airway/laryngeal closure Pharyngeal Material enters airway, remains ABOVE vocal cords and not ejected out Pharyngeal- Honey Cup Reduced epiglottic inversion;Reduced laryngeal elevation;Reduced airway/laryngeal closure;Penetration/Aspiration during swallow;Pharyngeal residue - valleculae Pharyngeal Material enters airway, remains ABOVE vocal cords and not ejected out Pharyngeal- Nectar Teaspoon -- Pharyngeal -- Pharyngeal- Nectar Cup NT Pharyngeal -- Pharyngeal- Nectar Straw -- Pharyngeal -- Pharyngeal- Thin Teaspoon -- Pharyngeal -- Pharyngeal- Thin Cup NT Pharyngeal -- Pharyngeal- Thin Straw -- Pharyngeal -- Pharyngeal- Puree Pharyngeal residue - valleculae;Reduced laryngeal elevation;Reduced epiglottic inversion Pharyngeal -- Pharyngeal-  Mechanical Soft -- Pharyngeal -- Pharyngeal- Regular -- Pharyngeal -- Pharyngeal- Multi-consistency -- Pharyngeal -- Pharyngeal- Pill -- Pharyngeal -- Pharyngeal Comment --  CHL IP CERVICAL ESOPHAGEAL PHASE 05/03/2017 Cervical Esophageal Phase WFL Pudding Teaspoon -- Pudding Cup -- Honey Teaspoon -- Honey Cup -- Nectar Teaspoon -- Nectar Cup -- Nectar Straw -- Thin Teaspoon -- Thin Cup -- Thin Straw -- Puree -- Mechanical Soft -- Regular -- Multi-consistency -- Pill -- Cervical Esophageal Comment -- CHL IP GO 10/17/2013 Functional Assessment Tool Used Mini-mental State Examination, ASHA NOMS, clinical judgment Functional Limitations Memory Swallow Current Status [phone]) (None) Swallow Goal Status (G1829) (None) Swallow Discharge Status (H3716) (None) Motor Speech Current Status (R6789) (None) Motor Speech Goal Status (F8101) (None) Motor Speech Goal Status (B5102) (None) Spoken Language Comprehension Current Status (H8527) (None) Spoken Language Comprehension Goal Status (P8242) (None) Spoken Language Comprehension Discharge Status [phone]) (None) Spoken Language Expression Current Status  [phone]) (None) Spoken Language Expression Goal Status (Q0086) (None) Spoken Language Expression Discharge Status [phone]) (None) Attention Current Status (K9326) (None) Attention Goal Status (Z1245) (None) Attention Discharge Status (Y0998) (None) Memory Current Status (P3825) CI Memory Goal Status (K5397) CI Memory Discharge Status (G9170) CI Voice Current Status (Q7341) (None) Voice Goal Status (P3790) (None) Voice Discharge Status (W4097) (None) Other Speech-Language Pathology Functional Limitation Current Status (D5329) (None) Other Speech-Language Pathology Functional Limitation Goal Status (J2426) (None) Other Speech-Language Pathology Functional Limitation Discharge Status [phone]) (None) [name] 05/03/2017, 4:10 PM  Orbie Pyo Colvin Caroli.Ed CCC-SLP Pager [phone]             Dg Swallowing Func-speech Pathology  Result Date: 04/27/2017 Objective Swallowing Evaluation: Type of Study: MBS-Modified Barium Swallow Study  Patient Details Name: Rebecca [name] MRN: [id number] Date of Birth: [birth year] Today's Date: 04/27/2017 Time: SLP Start Time (ACUTE ONLY): 1205 -SLP Stop Time (ACUTE ONLY): 1226 SLP Time Calculation (min) (ACUTE ONLY): 21 min Past Medical History: Past Medical History: Diagnosis Date . AKI (acute kidney injury) ([place])  . Anemia in chronic renal disease  . Aortic atherosclerosis ([place]) 01/27/2017 . CAD (coronary artery disease) 2006  a. s/p DES to RCA in 2006 b. low-risk NST in 2014 c. cath in 08/2015 showing patent RCA stent with nonobstructive disease . CKD (chronic kidney disease) stage 4, GFR 15-29 ml/min (HCC)  . Complication of anesthesia  . COPD (chronic obstructive pulmonary disease) ([place])  . Diabetes mellitus  . Diastolic heart failure, NYHA class 1 ([place])  . Dyslipidemia  . Dysuria 01/06/2015 . Frozen shoulder   right . Gastroesophageal reflux  . HAP (hospital-acquired pneumonia)  . Headache  . Hyperkalemia 01/01/2016 . Hypothyroidism  . MGUS (monoclonal gammopathy of unknown  significance) 01/28/2014 . Obesity  . Pancreatic mass 01/27/2017 . Pneumonia 06/2016 . Polymyalgia rheumatica ([place])  . PONV (postoperative nausea and vomiting)  . Renal insufficiency  . Respiratory distress  . Schizoaffective disorder ([place])  . Sepsis due to pneumonia ([place])  . Shortness of breath  . Stroke [facility])  Past Surgical History: Past Surgical History: Procedure Laterality Date . ABDOMINAL HYSTERECTOMY   . CATARACT EXTRACTION   . CHOLECYSTECTOMY   . CORONARY ANGIOPLASTY WITH STENT PLACEMENT  2006  [place] DES to RCA, 50 % residual . PARTIAL HYSTERECTOMY  1995 HPI: Janelie A Coxis a 76 y.o.femalewith medical history significant ofCOPD, pna,PMR, CAD, CVA, pna, GERD, pancreatic mass, barium esophagram revealing mild esophageal dysmotility, puree diet at baseline (thin liquids), schizoaffective disorder, CKD stage IV, diastolic heart failure, MGUS, hypothyroidism who was most recently admitted  at [facility] long hospital secondary to right-sided trochanteric bursitis, acute on chronic diastolic heart failure and healthcare acquired pneumonia. This admission found to have healthcare associated pna). MBS 09/2016 reveaked suspected primary esophageal deficits, pharyngeal residue, trace penetration thin to cords silent while consuming barium pill. Dys 3, thin recommended. New s/s aspiraiton exhibited 11/14 with recommendation for full objective assessment.   Subjective: pt awake in bed Assessment / Plan / Recommendation CHL IP CLINICAL IMPRESSIONS 04/27/2017 Clinical Impression Pt exhibits a suspected acute on chronic multifactoral oropharyngeal dysphagia evidenced by impaired motor and sensory function, history of known esophageal dysphagia and possible cervical fusion/osteophytes further impacted by current pna and deconditioning. Oral deficits are mild-mod with decreased oral control and transit. Silent and sensed aspiration and penetration before and during the swallow with thin, nectar and honey (cup & teaspoon  volumes) due to decreased timing of initiation of protective mechanisms in addition to wide pharyngeal space, weak elevation of larynx and poor epiglottic inversion. Chin tuck helped to narrow pharyngeal space promoting increased laryngeal closure however airway intrusion continued. Moderate vallecular residue of puree texture not fully cleared. Esophagus scanned revealing residue in distal to mid esophagus. Puree consumed without entering airway however due to deconditioned state would not recommend po continue po's at present and recommend alternate means of nutrition with continued ST. Timeline/prognosis for improvement unclear however hopeful for return to food/liquid when safe.  SLP Visit Diagnosis Dysphagia, oropharyngeal phase (R13.12) Attention and concentration deficit following -- Frontal lobe and executive function deficit following -- Impact on safety and function (No Data)   CHL IP TREATMENT RECOMMENDATION 04/27/2017 Treatment Recommendations Therapy as outlined in treatment plan below   Prognosis 04/27/2017 Prognosis for Safe Diet Advancement (No Data) Barriers to Reach Goals Severity of deficits Barriers/Prognosis Comment -- CHL IP DIET RECOMMENDATION 04/27/2017 SLP Diet Recommendations NPO Liquid Administration via -- Medication Administration Via alternative means Compensations -- Postural Changes --   CHL IP OTHER RECOMMENDATIONS 04/27/2017 Recommended Consults -- Oral Care Recommendations Oral care QID Other Recommendations --   CHL IP FOLLOW UP RECOMMENDATIONS 04/27/2017 Follow up Recommendations 24 hour supervision/assistance   CHL IP FREQUENCY AND DURATION 04/27/2017 Speech Therapy Frequency (ACUTE ONLY) min 2x/week Treatment Duration 2 weeks      CHL IP ORAL PHASE 04/27/2017 Oral Phase Impaired Oral - Pudding Teaspoon -- Oral - Pudding Cup -- Oral - Honey Teaspoon Delayed oral transit Oral - Honey Cup Delayed oral transit;Decreased bolus cohesion Oral - Nectar Teaspoon -- Oral - Nectar Cup  Delayed oral transit;Decreased bolus cohesion Oral - Nectar Straw -- Oral - Thin Teaspoon -- Oral - Thin Cup Delayed oral transit;Decreased bolus cohesion Oral - Thin Straw -- Oral - Puree Delayed oral transit Oral - Mech Soft -- Oral - Regular -- Oral - Multi-Consistency -- Oral - Pill -- Oral Phase - Comment --  CHL IP PHARYNGEAL PHASE 04/27/2017 Pharyngeal Phase Impaired Pharyngeal- Pudding Teaspoon -- Pharyngeal -- Pharyngeal- Pudding Cup -- Pharyngeal -- Pharyngeal- Honey Teaspoon Penetration/Aspiration during swallow;Reduced airway/laryngeal closure;Reduced laryngeal elevation;Reduced anterior laryngeal mobility;Reduced epiglottic inversion;Reduced pharyngeal peristalsis Pharyngeal Material enters airway, remains ABOVE vocal cords and not ejected out Pharyngeal- Honey Cup Penetration/Aspiration during swallow;Reduced airway/laryngeal closure;Reduced laryngeal elevation;Reduced anterior laryngeal mobility;Reduced epiglottic inversion Pharyngeal Material enters airway, remains ABOVE vocal cords then ejected out;Material enters airway, remains ABOVE vocal cords and not ejected out Pharyngeal- Nectar Teaspoon -- Pharyngeal -- Pharyngeal- Nectar Cup Penetration/Aspiration during swallow;Reduced laryngeal elevation;Reduced airway/laryngeal closure;Reduced anterior laryngeal mobility;Reduced epiglottic inversion;Reduced pharyngeal peristalsis;Reduced tongue base retraction Pharyngeal Material  enters airway, passes BELOW cords without attempt by patient to eject out (silent aspiration) Pharyngeal- Nectar Straw -- Pharyngeal -- Pharyngeal- Thin Teaspoon -- Pharyngeal -- Pharyngeal- Thin Cup Penetration/Aspiration before swallow;Reduced pharyngeal peristalsis;Reduced epiglottic inversion;Reduced laryngeal elevation;Reduced anterior laryngeal mobility;Reduced airway/laryngeal closure;Reduced tongue base retraction Pharyngeal Material enters airway, passes BELOW cords and not ejected out despite cough attempt by patient  Pharyngeal- Thin Straw -- Pharyngeal -- Pharyngeal- Puree Pharyngeal residue - valleculae;Reduced epiglottic inversion Pharyngeal -- Pharyngeal- Mechanical Soft -- Pharyngeal -- Pharyngeal- Regular -- Pharyngeal -- Pharyngeal- Multi-consistency -- Pharyngeal -- Pharyngeal- Pill -- Pharyngeal -- Pharyngeal Comment --  CHL IP CERVICAL ESOPHAGEAL PHASE 04/27/2017 Cervical Esophageal Phase WFL Pudding Teaspoon -- Pudding Cup -- Honey Teaspoon -- Honey Cup -- Nectar Teaspoon -- Nectar Cup -- Nectar Straw -- Thin Teaspoon -- Thin Cup -- Thin Straw -- Puree -- Mechanical Soft -- Regular -- Multi-consistency -- Pill -- Cervical Esophageal Comment -- CHL IP GO 10/17/2013 Functional Assessment Tool Used Mini-mental State Examination, ASHA NOMS, clinical judgment Functional Limitations Memory Swallow Current Status [phone]) (None) Swallow Goal Status (U0454) (None) Swallow Discharge Status (U9811) (None) Motor Speech Current Status (B1478) (None) Motor Speech Goal Status (G9562) (None) Motor Speech Goal Status (Z3086) (None) Spoken Language Comprehension Current Status (V7846) (None) Spoken Language Comprehension Goal Status (N6295) (None) Spoken Language Comprehension Discharge Status [phone]) (None) Spoken Language Expression Current Status [phone] (None) Spoken Language Expression Goal Status (U2725) (None) Spoken Language Expression Discharge Status [phone]) (None) Attention Current Status (I3474) (None) Attention Goal Status (Q5956) (None) Attention Discharge Status (L8756) (None) Memory Current Status (E3329) CI Memory Goal Status (J1884) CI Memory Discharge Status (G9170) CI Voice Current Status (Z6606) (None) Voice Goal Status (T0160) (None) Voice Discharge Status (F0932) (None) Other Speech-Language Pathology Functional Limitation Current Status (T5573) (None) Other Speech-Language Pathology Functional Limitation Goal Status (U2025) (None) Other Speech-Language Pathology Functional Limitation Discharge Status [phone])  (None) [name] 04/27/2017, 1:49 PM Orbie Pyo Colvin Caroli.Ed CCC-SLP Pager [phone]                  Subjective: Patient seen and examined at bedside.  She is awake and complains of hip pain.  Her appetite is poor.  Discharge Exam: Vitals:   05/11/17 0928 05/11/17 0944  BP:  (!) 160/68  Pulse:  73  Resp:    Temp:    SpO2: 97%    Vitals:   05/11/17 0700 05/11/17 0925 05/11/17 0928 05/11/17 0944  BP:    (!) 160/68  Pulse:    73  Resp:      Temp:      TempSrc:      SpO2:  97% 97%   Weight: 64.1 kg (141 lb 5 oz)     Height:        General: Elderly female lying in bed, awake and answers some questions  cardiovascular: Rate controlled, S1/S2 + Respiratory: Bilateral decreased breath sounds at bases with some scattered crackles Abdominal: Soft, NT, ND, bowel sounds + Extremities: Trace edema, no cyanosis    The results of significant diagnostics from this hospitalization (including imaging, microbiology, ancillary and laboratory) are listed below for reference.     Microbiology: No results found for this or any previous visit (from the past 240 hour(s)).   Labs: BNP (last 3 results) Recent Labs    07/14/16 1525 01/25/17 1644 03/20/17 1330  BNP 159.5* 109.5* 151.7*   Basic Metabolic Panel: Recent Labs  Lab 05/05/17 0210 05/06/17 1136 05/07/17 0340 05/08/17 0439 05/09/17 0416  NA 140 142 142 143 143  K 4.2 4.7 4.4 4.4 4.6  CL 109 110 111 113* 114*  CO2 26 29 27 26 24   GLUCOSE 145* 132* 126* 85 78  BUN 64* 59* 55* 44* 37*  CREATININE 1.61* 1.57* 1.64* 1.65* 1.68*  CALCIUM 7.2* 7.5* 7.5* 7.8* 7.7*   Liver Function Tests: Recent Labs  Lab 05/07/17 0340  AST 19  ALT 17  ALKPHOS 30*  BILITOT 0.6  PROT 3.8*  ALBUMIN 1.9*   No results for input(s): LIPASE, AMYLASE in the last 168 hours. No results for input(s): AMMONIA in the last 168 hours. CBC: Recent Labs  Lab 05/05/17 0210 05/06/17 1136 05/07/17 0340 05/08/17 0439 05/09/17 0416   WBC 19.1* 20.6* 15.6* 13.6* 10.7*  NEUTROABS  --  18.5* 13.1* 11.2* 8.5*  HGB 8.6* 8.6* 7.7* 7.8* 8.1*  HCT 25.3* 26.4* 24.0* 24.2* 25.2*  MCV 94.8 96.0 97.2 97.6 96.2  PLT 182 191 163 197 186   Cardiac Enzymes: No results for input(s): CKTOTAL, [name], [name], TROPONINI in the last 168 hours. BNP: Invalid input(s): POCBNP CBG: Recent Labs  Lab 05/10/17 1157 05/10/17 1654 05/10/17 2234 05/11/17 0815 05/11/17 1159  GLUCAP 101* 132* 99 76 118*   D-Dimer No results for input(s): DDIMER in the last 72 hours. Hgb A1c No results for input(s): HGBA1C in the last 72 hours. Lipid Profile No results for input(s): CHOL, [name], [name], [name], [name], LDLDIRECT in the last 72 hours. Thyroid function studies No results for input(s): TSH, T4TOTAL, T3FREE, THYROIDAB in the last 72 hours.  Invalid input(s): FREET3 Anemia work up No results for input(s): VITAMINB12, FOLATE, FERRITIN, TIBC, IRON, RETICCTPCT in the last 72 hours. Urinalysis    Component Value Date/Time   COLORURINE YELLOW 04/24/2017 1501   APPEARANCEUR HAZY (A) 04/24/2017 1501   LABSPEC 1.015 04/24/2017 1501   LABSPEC 1.005 03/07/2017 1549   PHURINE 5.0 04/24/2017 1501   GLUCOSEU NEGATIVE 04/24/2017 1501   GLUCOSEU Negative 03/07/2017 1549   HGBUR NEGATIVE 04/24/2017 1501   HGBUR negative 12/03/2008 1113   BILIRUBINUR NEGATIVE 04/24/2017 1501   BILIRUBINUR Negative 03/07/2017 1549   KETONESUR 5 (A) 04/24/2017 1501   PROTEINUR 100 (A) 04/24/2017 1501   UROBILINOGEN 0.2 03/07/2017 1549   NITRITE NEGATIVE 04/24/2017 1501   LEUKOCYTESUR SMALL (A) 04/24/2017 1501   LEUKOCYTESUR Small 03/07/2017 1549   Sepsis Labs Invalid input(s): PROCALCITONIN,  WBC,  LACTICIDVEN Microbiology No results found for this or any previous visit (from the past 240 hour(s)).   Time coordinating discharge: 35 minutes  SIGNED:   Aline August, MD  [facility] 05/11/2017, 12:14 PM Pager: [phone]  If 7PM-7AM, please  contact night-coverage [email or web address] Password [email or web address]

## 2017-05-11 NOTE — Progress Notes (Signed)
Daily Progress Note   Patient Name: Rebecca [name]       Date: 05/11/2017 DOB: 04-May-1941  Age: 76 y.o. MRN#: [id number] Attending Physician: Aline August, MD Primary Care Physician: [name], MD Admit Date: 04/24/2017  Reason for Consultation/Follow-up: Establishing goals of care  Subjective: Rebecca [name] is lying in bed. Complains of 9/10 pain in her right hip.    Length of Stay: 17  Current Medications: Scheduled Meds:  . acyclovir  400 mg Oral TID  . chlorhexidine  15 mL Mouth Rinse BID  . clopidogrel  75 mg Oral QHS  . clotrimazole   Topical BID  . cloZAPine  300 mg Oral QHS  . colchicine  0.3 mg Oral QHS  . diltiazem  240 mg Oral Daily  . guaiFENesin  400 mg Oral TID  . hydrocortisone   Rectal BID  . ipratropium-albuterol  3 mL Inhalation TID  . isosorbide mononitrate  20 mg Oral BID  . levothyroxine  75 mcg Oral QAC breakfast  . mouth rinse  15 mL Mouth Rinse q12n4p  . metoprolol tartrate  50 mg Oral BID  . mometasone-formoterol  2 puff Inhalation BID  . mupirocin ointment   Topical BID  . pantoprazole  40 mg Oral Daily  . polyethylene glycol  17 g Oral BID  . pravastatin  20 mg Oral QHS  . predniSONE  10 mg Oral Q breakfast  . senna-docusate  2 tablet Oral BID  . sucralfate  1 g Oral TID WC & HS  . tamsulosin  0.4 mg Oral Q breakfast    Continuous Infusions: . sodium chloride Stopped (05/10/17 0107)    PRN Meds: acetaminophen **OR** acetaminophen, bisacodyl, fluticasone, gi cocktail, lip balm, morphine injection, morphine CONCENTRATE, ondansetron **OR** ondansetron (ZOFRAN) IV, RESOURCE THICKENUP CLEAR, traMADol, traMADol  Physical Exam         Constitutional: She is oriented to person, place, and time. She appears well-developed. She appears lethargic.  She appears ill.  Frail, elderly  HENT:  Head: Normocephalic and atraumatic.  Cardiovascular: Normal rate and regular rhythm.  Pulmonary/Chest: Effort normal. No accessory muscle usage. No tachypnea. No respiratory distress.  Abdominal: Soft. Normal appearance.  Neurological: She appears more alert today.  Skin:  Extensive bruising to bilat arms (chronic steroid use + anticoagulation). Skin thin and frail.  Nursing note and vitals reviewed.    Vital Signs: BP (!) 160/68   Pulse 73   Temp 98.5 F (36.9 C) (Oral)   Resp 16   Ht _0  (1.6 m)   Wt 64.1 kg (141 lb 5 oz)   SpO2 97%   BMI 25.03 kg/m  SpO2: SpO2: 97 % O2 Device: O2 Device: Not Delivered O2 Flow Rate: O2 Flow Rate (L/min): 3 L/min  Intake/output summary:   Intake/Output Summary (Last 24 hours) at 05/11/2017 1143 Last data filed at 05/11/2017 2458 Gross per 24 hour  Intake 220 ml  Output 602 ml  Net -382 ml   LBM: Last BM Date: 05/10/17 Baseline Weight: Weight: 59.9 kg (132 lb) Most recent weight: Weight: 64.1 kg (141 lb 5 oz)       Palliative Assessment/Data: 30%   Flowsheet Rows     Most Recent Value  Intake Tab  Referral Department  Hospitalist  Unit at Time of Referral  Intermediate Care Unit  Palliative Care Primary Diagnosis  Neurology  Date Notified  04/29/17  Palliative Care Type  Return patient Palliative Care  Reason for referral  Clarify Goals of Care  Date of Admission  04/24/17  Date first seen by Palliative Care  04/29/17  # of days Palliative referral response time  0 Day(s)  # of days IP prior to Palliative referral  5  Clinical Assessment  Psychosocial & Spiritual Assessment  Palliative Care Outcomes      Patient Active Problem List   Diagnosis Date Noted  . Poor appetite   . Acute esophagitis   . Poor fluid intake   . Dysphagia 04/27/2017  . Pulmonary infiltrates c/w recurrent aspiration pneumonitis vs hcap 03/30/2017  . Prerenal azotemia 03/30/2017  . Dyspnea on  exertion 03/29/2017  . Weakness   . Hypervolemia   . Lethargy   . Pain of right hip joint   . Acute right hip pain 03/14/2017  . Physical debility 03/08/2017  . Recurrent UTI 03/08/2017  . Ascites 03/08/2017  . Chronic pain disorder 03/07/2017  . Chronic constipation   . Community acquired pneumonia   . Palliative care encounter   . Goals of care, counseling/discussion   . TIA (transient ischemic attack) 01/27/2017  . Pancreatic mass 01/27/2017  . Aortic atherosclerosis () 01/27/2017  . Hyponatremia 01/25/2017  . Generalized weakness 01/25/2017  . Fatigue 01/11/2017  . Mixed hyperlipidemia 12/02/2016  . Encephalopathy 11/20/2016  . Altered mental status 11/19/2016  . Aspiration pneumonia of both lower lobes due to gastric secretions ([place]) 10/10/2016  . Aspiration pneumonia ([place]) 10/07/2016  . Lobar pneumonia, unspecified organism ([place]) 10/07/2016  . Ileus ([place]) 10/07/2016  . AKI (acute kidney injury) ([place]) 09/12/2016  . HAP (hospital-acquired pneumonia) 09/12/2016  . Respiratory distress 09/12/2016  . Pressure injury of skin 07/15/2016  . Essential hypertension 07/14/2016  . Acute encephalopathy 07/14/2016  . Coronary artery disease involving native coronary artery of native heart without angina pectoris 05/14/2016  . CKD stage 4, GFR 15-29 ml/min  08/11/2015  . 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 Leukocytosis 01/28/2014  . History of TIA - May 2015- Plavix 10/16/2013  . Polymyalgia rheumatica ([place]) 10/03/2012  . Chronic diastolic (congestive) heart failure ([place]) 07/15/2012  . Trochanteric bursitis of right hip 04/11/2012  . Dysuria 08/30/2011  . History of small bowel obstruction 11/04/2010  . Schizoaffective disorder ([place]) 01/10/2008  . Dyslipidemia 01/25/2007  . Hypothyroidism 08/11/2006  . Anemia in chronic renal disease 08/11/2006  .  GASTROESOPHAGEAL REFLUX, NO ESOPHAGITIS 08/11/2006    Palliative Care Assessment & Plan    HPI: 76 y.o. female  with past medical history of COPD, PMR on chronic prednisone, CAD, diastolic CHF, CKD stage III, MGUS, recent admission to [facility] with right-sided trochanteric bursitis and went to [facility] for rehab but had returned home recently with daughter. Was admitted on 04/24/2017 with lethargy and fever r/t UTI and aspiration pneumonia. Has struggled with worsening dysphagia and have proceeded with temporary Cortrak placement to allow for rest for esophagitis and SLP will reassess swallow function after the weekend. MBS scheduled for 1300 05/03/17. Intake tolerance fluctuating but is taking in more fluids and eating up to half her meals at times.   Assessment: I met again today with Rebecca [name] and Alva. They have just spoken with [facility] nurse liaison.  They feel good about getting home this evening after Amanda's appointment for her to be at home for her mother. Ordered extra one time dose of tramadol for pain - this usually provides her good relief. All questions/concerns have been addressed. Has been a great pleasure to work with them this admission.   Recommendations/Plan:  MOST and golden DNR completed for discharge  Cortrak out now. Does not desire any further artificial nutrition or hydration.   MBS done -  Dys I, honey thick fluids. Intake poor.   Added carafate TID. GI cocktail BID prn. Receiving Diflucan.   Constipation: Dulcolax supp x 1 and daily prn.   Hemorrhoids: Hydrocortisone 2.5% rectal cream BID.   Code Status:  DNR  Prognosis:  Weeks to months. < 6 months. Depends on toleration of intake.   Discharge Planning:  Home with hospice   Thank you for allowing the Palliative Medicine Team to assist in the care of this patient.   Total Time 25 min Prolonged Time Billed  no       Greater than 50%  of this time was spent counseling and coordinating care related to the above assessment and plan.  Vinie Sill, NP Palliative Medicine  Team Pager # [phone] (M-F 8a-5p) Team Phone # [phone] (Nights/Weekends)

## 2017-05-12 ENCOUNTER — Other Ambulatory Visit: Payer: Self-pay

## 2017-05-12 DIAGNOSIS — Z8701 Personal history of pneumonia (recurrent): Secondary | ICD-10-CM | POA: Diagnosis not present

## 2017-05-12 DIAGNOSIS — M353 Polymyalgia rheumatica: Secondary | ICD-10-CM | POA: Diagnosis not present

## 2017-05-12 DIAGNOSIS — Z955 Presence of coronary angioplasty implant and graft: Secondary | ICD-10-CM | POA: Diagnosis not present

## 2017-05-12 DIAGNOSIS — Z87440 Personal history of urinary (tract) infections: Secondary | ICD-10-CM | POA: Diagnosis not present

## 2017-05-12 DIAGNOSIS — I130 Hypertensive heart and chronic kidney disease with heart failure and stage 1 through stage 4 chronic kidney disease, or unspecified chronic kidney disease: Secondary | ICD-10-CM | POA: Diagnosis not present

## 2017-05-12 DIAGNOSIS — I5032 Chronic diastolic (congestive) heart failure: Secondary | ICD-10-CM | POA: Diagnosis not present

## 2017-05-12 DIAGNOSIS — M11251 Other chondrocalcinosis, right hip: Secondary | ICD-10-CM | POA: Diagnosis not present

## 2017-05-12 DIAGNOSIS — N184 Chronic kidney disease, stage 4 (severe): Secondary | ICD-10-CM | POA: Diagnosis not present

## 2017-05-12 DIAGNOSIS — Z87891 Personal history of nicotine dependence: Secondary | ICD-10-CM | POA: Diagnosis not present

## 2017-05-12 DIAGNOSIS — K8689 Other specified diseases of pancreas: Secondary | ICD-10-CM | POA: Diagnosis not present

## 2017-05-12 DIAGNOSIS — R1312 Dysphagia, oropharyngeal phase: Secondary | ICD-10-CM | POA: Diagnosis not present

## 2017-05-12 DIAGNOSIS — J449 Chronic obstructive pulmonary disease, unspecified: Secondary | ICD-10-CM | POA: Diagnosis not present

## 2017-05-12 DIAGNOSIS — I2510 Atherosclerotic heart disease of native coronary artery without angina pectoris: Secondary | ICD-10-CM | POA: Diagnosis not present

## 2017-05-12 NOTE — Patient Outreach (Signed)
[facility]) Care Management  05/12/2017  Rebecca [name] 1940-10-18 [id number]   Discussed in multidisciplinary case conference. Case Currently closed. Client discharged to Home with Hospice therefore who will be managing client's case. Case remains closed.  Thea Silversmith, RN, MSN, [place] Coordinator Cell: [phone]

## 2017-05-13 DIAGNOSIS — M353 Polymyalgia rheumatica: Secondary | ICD-10-CM | POA: Diagnosis not present

## 2017-05-13 DIAGNOSIS — Z6823 Body mass index (BMI) 23.0-23.9, adult: Secondary | ICD-10-CM | POA: Diagnosis not present

## 2017-05-13 DIAGNOSIS — N184 Chronic kidney disease, stage 4 (severe): Secondary | ICD-10-CM | POA: Diagnosis not present

## 2017-05-13 DIAGNOSIS — I130 Hypertensive heart and chronic kidney disease with heart failure and stage 1 through stage 4 chronic kidney disease, or unspecified chronic kidney disease: Secondary | ICD-10-CM | POA: Diagnosis not present

## 2017-05-13 DIAGNOSIS — J449 Chronic obstructive pulmonary disease, unspecified: Secondary | ICD-10-CM | POA: Diagnosis not present

## 2017-05-13 DIAGNOSIS — I5032 Chronic diastolic (congestive) heart failure: Secondary | ICD-10-CM | POA: Diagnosis not present

## 2017-05-13 DIAGNOSIS — I2510 Atherosclerotic heart disease of native coronary artery without angina pectoris: Secondary | ICD-10-CM | POA: Diagnosis not present

## 2017-05-13 DIAGNOSIS — G894 Chronic pain syndrome: Secondary | ICD-10-CM | POA: Diagnosis not present

## 2017-05-13 DIAGNOSIS — I10 Essential (primary) hypertension: Secondary | ICD-10-CM | POA: Diagnosis not present

## 2017-05-13 DIAGNOSIS — N390 Urinary tract infection, site not specified: Secondary | ICD-10-CM | POA: Diagnosis not present

## 2017-05-13 DIAGNOSIS — J189 Pneumonia, unspecified organism: Secondary | ICD-10-CM | POA: Diagnosis not present

## 2017-05-13 DIAGNOSIS — R1312 Dysphagia, oropharyngeal phase: Secondary | ICD-10-CM | POA: Diagnosis not present

## 2017-05-13 DIAGNOSIS — I509 Heart failure, unspecified: Secondary | ICD-10-CM | POA: Diagnosis not present

## 2017-05-13 DIAGNOSIS — R339 Retention of urine, unspecified: Secondary | ICD-10-CM | POA: Diagnosis not present

## 2017-05-13 DIAGNOSIS — D638 Anemia in other chronic diseases classified elsewhere: Secondary | ICD-10-CM | POA: Diagnosis not present

## 2017-05-13 DIAGNOSIS — M25551 Pain in right hip: Secondary | ICD-10-CM | POA: Diagnosis not present

## 2017-05-14 DIAGNOSIS — K8689 Other specified diseases of pancreas: Secondary | ICD-10-CM | POA: Diagnosis not present

## 2017-05-14 DIAGNOSIS — Z8701 Personal history of pneumonia (recurrent): Secondary | ICD-10-CM | POA: Diagnosis not present

## 2017-05-14 DIAGNOSIS — N184 Chronic kidney disease, stage 4 (severe): Secondary | ICD-10-CM | POA: Diagnosis not present

## 2017-05-14 DIAGNOSIS — M11251 Other chondrocalcinosis, right hip: Secondary | ICD-10-CM | POA: Diagnosis not present

## 2017-05-14 DIAGNOSIS — Z87891 Personal history of nicotine dependence: Secondary | ICD-10-CM | POA: Diagnosis not present

## 2017-05-14 DIAGNOSIS — I5032 Chronic diastolic (congestive) heart failure: Secondary | ICD-10-CM | POA: Diagnosis not present

## 2017-05-14 DIAGNOSIS — M353 Polymyalgia rheumatica: Secondary | ICD-10-CM | POA: Diagnosis not present

## 2017-05-14 DIAGNOSIS — Z87440 Personal history of urinary (tract) infections: Secondary | ICD-10-CM | POA: Diagnosis not present

## 2017-05-14 DIAGNOSIS — I2510 Atherosclerotic heart disease of native coronary artery without angina pectoris: Secondary | ICD-10-CM | POA: Diagnosis not present

## 2017-05-14 DIAGNOSIS — J449 Chronic obstructive pulmonary disease, unspecified: Secondary | ICD-10-CM | POA: Diagnosis not present

## 2017-05-14 DIAGNOSIS — Z955 Presence of coronary angioplasty implant and graft: Secondary | ICD-10-CM | POA: Diagnosis not present

## 2017-05-14 DIAGNOSIS — R1312 Dysphagia, oropharyngeal phase: Secondary | ICD-10-CM | POA: Diagnosis not present

## 2017-05-14 DIAGNOSIS — I130 Hypertensive heart and chronic kidney disease with heart failure and stage 1 through stage 4 chronic kidney disease, or unspecified chronic kidney disease: Secondary | ICD-10-CM | POA: Diagnosis not present

## 2017-05-16 DIAGNOSIS — I2510 Atherosclerotic heart disease of native coronary artery without angina pectoris: Secondary | ICD-10-CM | POA: Diagnosis not present

## 2017-05-16 DIAGNOSIS — I130 Hypertensive heart and chronic kidney disease with heart failure and stage 1 through stage 4 chronic kidney disease, or unspecified chronic kidney disease: Secondary | ICD-10-CM | POA: Diagnosis not present

## 2017-05-16 DIAGNOSIS — I5032 Chronic diastolic (congestive) heart failure: Secondary | ICD-10-CM | POA: Diagnosis not present

## 2017-05-16 DIAGNOSIS — N184 Chronic kidney disease, stage 4 (severe): Secondary | ICD-10-CM | POA: Diagnosis not present

## 2017-05-16 DIAGNOSIS — J449 Chronic obstructive pulmonary disease, unspecified: Secondary | ICD-10-CM | POA: Diagnosis not present

## 2017-05-16 DIAGNOSIS — R1312 Dysphagia, oropharyngeal phase: Secondary | ICD-10-CM | POA: Diagnosis not present

## 2017-05-17 DIAGNOSIS — I2510 Atherosclerotic heart disease of native coronary artery without angina pectoris: Secondary | ICD-10-CM | POA: Diagnosis not present

## 2017-05-17 DIAGNOSIS — I130 Hypertensive heart and chronic kidney disease with heart failure and stage 1 through stage 4 chronic kidney disease, or unspecified chronic kidney disease: Secondary | ICD-10-CM | POA: Diagnosis not present

## 2017-05-17 DIAGNOSIS — I5032 Chronic diastolic (congestive) heart failure: Secondary | ICD-10-CM | POA: Diagnosis not present

## 2017-05-17 DIAGNOSIS — J449 Chronic obstructive pulmonary disease, unspecified: Secondary | ICD-10-CM | POA: Diagnosis not present

## 2017-05-17 DIAGNOSIS — N184 Chronic kidney disease, stage 4 (severe): Secondary | ICD-10-CM | POA: Diagnosis not present

## 2017-05-17 DIAGNOSIS — R1312 Dysphagia, oropharyngeal phase: Secondary | ICD-10-CM | POA: Diagnosis not present

## 2017-05-18 DIAGNOSIS — I130 Hypertensive heart and chronic kidney disease with heart failure and stage 1 through stage 4 chronic kidney disease, or unspecified chronic kidney disease: Secondary | ICD-10-CM | POA: Diagnosis not present

## 2017-05-18 DIAGNOSIS — I2510 Atherosclerotic heart disease of native coronary artery without angina pectoris: Secondary | ICD-10-CM | POA: Diagnosis not present

## 2017-05-18 DIAGNOSIS — I5032 Chronic diastolic (congestive) heart failure: Secondary | ICD-10-CM | POA: Diagnosis not present

## 2017-05-18 DIAGNOSIS — J449 Chronic obstructive pulmonary disease, unspecified: Secondary | ICD-10-CM | POA: Diagnosis not present

## 2017-05-18 DIAGNOSIS — N184 Chronic kidney disease, stage 4 (severe): Secondary | ICD-10-CM | POA: Diagnosis not present

## 2017-05-18 DIAGNOSIS — R1312 Dysphagia, oropharyngeal phase: Secondary | ICD-10-CM | POA: Diagnosis not present

## 2017-05-19 DIAGNOSIS — I2510 Atherosclerotic heart disease of native coronary artery without angina pectoris: Secondary | ICD-10-CM | POA: Diagnosis not present

## 2017-05-19 DIAGNOSIS — I5032 Chronic diastolic (congestive) heart failure: Secondary | ICD-10-CM | POA: Diagnosis not present

## 2017-05-19 DIAGNOSIS — N184 Chronic kidney disease, stage 4 (severe): Secondary | ICD-10-CM | POA: Diagnosis not present

## 2017-05-19 DIAGNOSIS — I130 Hypertensive heart and chronic kidney disease with heart failure and stage 1 through stage 4 chronic kidney disease, or unspecified chronic kidney disease: Secondary | ICD-10-CM | POA: Diagnosis not present

## 2017-05-19 DIAGNOSIS — J449 Chronic obstructive pulmonary disease, unspecified: Secondary | ICD-10-CM | POA: Diagnosis not present

## 2017-05-19 DIAGNOSIS — R1312 Dysphagia, oropharyngeal phase: Secondary | ICD-10-CM | POA: Diagnosis not present

## 2017-05-21 DIAGNOSIS — R1312 Dysphagia, oropharyngeal phase: Secondary | ICD-10-CM | POA: Diagnosis not present

## 2017-05-21 DIAGNOSIS — J449 Chronic obstructive pulmonary disease, unspecified: Secondary | ICD-10-CM | POA: Diagnosis not present

## 2017-05-21 DIAGNOSIS — I130 Hypertensive heart and chronic kidney disease with heart failure and stage 1 through stage 4 chronic kidney disease, or unspecified chronic kidney disease: Secondary | ICD-10-CM | POA: Diagnosis not present

## 2017-05-21 DIAGNOSIS — N184 Chronic kidney disease, stage 4 (severe): Secondary | ICD-10-CM | POA: Diagnosis not present

## 2017-05-21 DIAGNOSIS — I2510 Atherosclerotic heart disease of native coronary artery without angina pectoris: Secondary | ICD-10-CM | POA: Diagnosis not present

## 2017-05-21 DIAGNOSIS — I5032 Chronic diastolic (congestive) heart failure: Secondary | ICD-10-CM | POA: Diagnosis not present

## 2017-05-23 ENCOUNTER — Ambulatory Visit: Payer: Medicare Other | Admitting: Internal Medicine

## 2017-05-25 DIAGNOSIS — N184 Chronic kidney disease, stage 4 (severe): Secondary | ICD-10-CM | POA: Diagnosis not present

## 2017-05-25 DIAGNOSIS — R1312 Dysphagia, oropharyngeal phase: Secondary | ICD-10-CM | POA: Diagnosis not present

## 2017-05-25 DIAGNOSIS — I2510 Atherosclerotic heart disease of native coronary artery without angina pectoris: Secondary | ICD-10-CM | POA: Diagnosis not present

## 2017-05-25 DIAGNOSIS — J449 Chronic obstructive pulmonary disease, unspecified: Secondary | ICD-10-CM | POA: Diagnosis not present

## 2017-05-25 DIAGNOSIS — I130 Hypertensive heart and chronic kidney disease with heart failure and stage 1 through stage 4 chronic kidney disease, or unspecified chronic kidney disease: Secondary | ICD-10-CM | POA: Diagnosis not present

## 2017-05-25 DIAGNOSIS — I5032 Chronic diastolic (congestive) heart failure: Secondary | ICD-10-CM | POA: Diagnosis not present

## 2017-05-26 DIAGNOSIS — J449 Chronic obstructive pulmonary disease, unspecified: Secondary | ICD-10-CM | POA: Diagnosis not present

## 2017-05-26 DIAGNOSIS — I130 Hypertensive heart and chronic kidney disease with heart failure and stage 1 through stage 4 chronic kidney disease, or unspecified chronic kidney disease: Secondary | ICD-10-CM | POA: Diagnosis not present

## 2017-05-26 DIAGNOSIS — N184 Chronic kidney disease, stage 4 (severe): Secondary | ICD-10-CM | POA: Diagnosis not present

## 2017-05-26 DIAGNOSIS — R1312 Dysphagia, oropharyngeal phase: Secondary | ICD-10-CM | POA: Diagnosis not present

## 2017-05-26 DIAGNOSIS — I2510 Atherosclerotic heart disease of native coronary artery without angina pectoris: Secondary | ICD-10-CM | POA: Diagnosis not present

## 2017-05-26 DIAGNOSIS — I5032 Chronic diastolic (congestive) heart failure: Secondary | ICD-10-CM | POA: Diagnosis not present

## 2017-05-28 DIAGNOSIS — R1312 Dysphagia, oropharyngeal phase: Secondary | ICD-10-CM | POA: Diagnosis not present

## 2017-05-28 DIAGNOSIS — I130 Hypertensive heart and chronic kidney disease with heart failure and stage 1 through stage 4 chronic kidney disease, or unspecified chronic kidney disease: Secondary | ICD-10-CM | POA: Diagnosis not present

## 2017-05-28 DIAGNOSIS — J449 Chronic obstructive pulmonary disease, unspecified: Secondary | ICD-10-CM | POA: Diagnosis not present

## 2017-05-28 DIAGNOSIS — I5032 Chronic diastolic (congestive) heart failure: Secondary | ICD-10-CM | POA: Diagnosis not present

## 2017-05-28 DIAGNOSIS — N184 Chronic kidney disease, stage 4 (severe): Secondary | ICD-10-CM | POA: Diagnosis not present

## 2017-05-28 DIAGNOSIS — I2510 Atherosclerotic heart disease of native coronary artery without angina pectoris: Secondary | ICD-10-CM | POA: Diagnosis not present

## 2017-05-30 DIAGNOSIS — I5032 Chronic diastolic (congestive) heart failure: Secondary | ICD-10-CM | POA: Diagnosis not present

## 2017-05-30 DIAGNOSIS — R1312 Dysphagia, oropharyngeal phase: Secondary | ICD-10-CM | POA: Diagnosis not present

## 2017-05-30 DIAGNOSIS — N184 Chronic kidney disease, stage 4 (severe): Secondary | ICD-10-CM | POA: Diagnosis not present

## 2017-05-30 DIAGNOSIS — I130 Hypertensive heart and chronic kidney disease with heart failure and stage 1 through stage 4 chronic kidney disease, or unspecified chronic kidney disease: Secondary | ICD-10-CM | POA: Diagnosis not present

## 2017-05-30 DIAGNOSIS — I2510 Atherosclerotic heart disease of native coronary artery without angina pectoris: Secondary | ICD-10-CM | POA: Diagnosis not present

## 2017-05-30 DIAGNOSIS — J449 Chronic obstructive pulmonary disease, unspecified: Secondary | ICD-10-CM | POA: Diagnosis not present

## 2017-05-31 DIAGNOSIS — I5032 Chronic diastolic (congestive) heart failure: Secondary | ICD-10-CM | POA: Diagnosis not present

## 2017-05-31 DIAGNOSIS — I2510 Atherosclerotic heart disease of native coronary artery without angina pectoris: Secondary | ICD-10-CM | POA: Diagnosis not present

## 2017-05-31 DIAGNOSIS — J449 Chronic obstructive pulmonary disease, unspecified: Secondary | ICD-10-CM | POA: Diagnosis not present

## 2017-05-31 DIAGNOSIS — N184 Chronic kidney disease, stage 4 (severe): Secondary | ICD-10-CM | POA: Diagnosis not present

## 2017-05-31 DIAGNOSIS — R1312 Dysphagia, oropharyngeal phase: Secondary | ICD-10-CM | POA: Diagnosis not present

## 2017-05-31 DIAGNOSIS — I130 Hypertensive heart and chronic kidney disease with heart failure and stage 1 through stage 4 chronic kidney disease, or unspecified chronic kidney disease: Secondary | ICD-10-CM | POA: Diagnosis not present

## 2017-06-02 DIAGNOSIS — I2510 Atherosclerotic heart disease of native coronary artery without angina pectoris: Secondary | ICD-10-CM | POA: Diagnosis not present

## 2017-06-02 DIAGNOSIS — K5900 Constipation, unspecified: Secondary | ICD-10-CM | POA: Diagnosis not present

## 2017-06-02 DIAGNOSIS — I5032 Chronic diastolic (congestive) heart failure: Secondary | ICD-10-CM | POA: Diagnosis not present

## 2017-06-02 DIAGNOSIS — R1312 Dysphagia, oropharyngeal phase: Secondary | ICD-10-CM | POA: Diagnosis not present

## 2017-06-02 DIAGNOSIS — N184 Chronic kidney disease, stage 4 (severe): Secondary | ICD-10-CM | POA: Diagnosis not present

## 2017-06-02 DIAGNOSIS — I130 Hypertensive heart and chronic kidney disease with heart failure and stage 1 through stage 4 chronic kidney disease, or unspecified chronic kidney disease: Secondary | ICD-10-CM | POA: Diagnosis not present

## 2017-06-02 DIAGNOSIS — J449 Chronic obstructive pulmonary disease, unspecified: Secondary | ICD-10-CM | POA: Diagnosis not present

## 2017-06-03 DIAGNOSIS — R1312 Dysphagia, oropharyngeal phase: Secondary | ICD-10-CM | POA: Diagnosis not present

## 2017-06-03 DIAGNOSIS — I2510 Atherosclerotic heart disease of native coronary artery without angina pectoris: Secondary | ICD-10-CM | POA: Diagnosis not present

## 2017-06-03 DIAGNOSIS — J449 Chronic obstructive pulmonary disease, unspecified: Secondary | ICD-10-CM | POA: Diagnosis not present

## 2017-06-03 DIAGNOSIS — N184 Chronic kidney disease, stage 4 (severe): Secondary | ICD-10-CM | POA: Diagnosis not present

## 2017-06-03 DIAGNOSIS — I5032 Chronic diastolic (congestive) heart failure: Secondary | ICD-10-CM | POA: Diagnosis not present

## 2017-06-03 DIAGNOSIS — I130 Hypertensive heart and chronic kidney disease with heart failure and stage 1 through stage 4 chronic kidney disease, or unspecified chronic kidney disease: Secondary | ICD-10-CM | POA: Diagnosis not present

## 2017-06-06 DIAGNOSIS — I130 Hypertensive heart and chronic kidney disease with heart failure and stage 1 through stage 4 chronic kidney disease, or unspecified chronic kidney disease: Secondary | ICD-10-CM | POA: Diagnosis not present

## 2017-06-06 DIAGNOSIS — N184 Chronic kidney disease, stage 4 (severe): Secondary | ICD-10-CM | POA: Diagnosis not present

## 2017-06-06 DIAGNOSIS — R1312 Dysphagia, oropharyngeal phase: Secondary | ICD-10-CM | POA: Diagnosis not present

## 2017-06-06 DIAGNOSIS — I5032 Chronic diastolic (congestive) heart failure: Secondary | ICD-10-CM | POA: Diagnosis not present

## 2017-06-06 DIAGNOSIS — I2510 Atherosclerotic heart disease of native coronary artery without angina pectoris: Secondary | ICD-10-CM | POA: Diagnosis not present

## 2017-06-06 DIAGNOSIS — J449 Chronic obstructive pulmonary disease, unspecified: Secondary | ICD-10-CM | POA: Diagnosis not present

## 2017-06-08 DIAGNOSIS — N184 Chronic kidney disease, stage 4 (severe): Secondary | ICD-10-CM | POA: Diagnosis not present

## 2017-06-08 DIAGNOSIS — R1312 Dysphagia, oropharyngeal phase: Secondary | ICD-10-CM | POA: Diagnosis not present

## 2017-06-08 DIAGNOSIS — J449 Chronic obstructive pulmonary disease, unspecified: Secondary | ICD-10-CM | POA: Diagnosis not present

## 2017-06-08 DIAGNOSIS — I130 Hypertensive heart and chronic kidney disease with heart failure and stage 1 through stage 4 chronic kidney disease, or unspecified chronic kidney disease: Secondary | ICD-10-CM | POA: Diagnosis not present

## 2017-06-08 DIAGNOSIS — I2510 Atherosclerotic heart disease of native coronary artery without angina pectoris: Secondary | ICD-10-CM | POA: Diagnosis not present

## 2017-06-08 DIAGNOSIS — I5032 Chronic diastolic (congestive) heart failure: Secondary | ICD-10-CM | POA: Diagnosis not present

## 2017-06-09 DIAGNOSIS — N184 Chronic kidney disease, stage 4 (severe): Secondary | ICD-10-CM | POA: Diagnosis not present

## 2017-06-09 DIAGNOSIS — R1312 Dysphagia, oropharyngeal phase: Secondary | ICD-10-CM | POA: Diagnosis not present

## 2017-06-09 DIAGNOSIS — I2510 Atherosclerotic heart disease of native coronary artery without angina pectoris: Secondary | ICD-10-CM | POA: Diagnosis not present

## 2017-06-09 DIAGNOSIS — J449 Chronic obstructive pulmonary disease, unspecified: Secondary | ICD-10-CM | POA: Diagnosis not present

## 2017-06-09 DIAGNOSIS — I5032 Chronic diastolic (congestive) heart failure: Secondary | ICD-10-CM | POA: Diagnosis not present

## 2017-06-09 DIAGNOSIS — I130 Hypertensive heart and chronic kidney disease with heart failure and stage 1 through stage 4 chronic kidney disease, or unspecified chronic kidney disease: Secondary | ICD-10-CM | POA: Diagnosis not present

## 2017-06-10 DIAGNOSIS — I130 Hypertensive heart and chronic kidney disease with heart failure and stage 1 through stage 4 chronic kidney disease, or unspecified chronic kidney disease: Secondary | ICD-10-CM | POA: Diagnosis not present

## 2017-06-10 DIAGNOSIS — I5032 Chronic diastolic (congestive) heart failure: Secondary | ICD-10-CM | POA: Diagnosis not present

## 2017-06-10 DIAGNOSIS — J449 Chronic obstructive pulmonary disease, unspecified: Secondary | ICD-10-CM | POA: Diagnosis not present

## 2017-06-10 DIAGNOSIS — R1312 Dysphagia, oropharyngeal phase: Secondary | ICD-10-CM | POA: Diagnosis not present

## 2017-06-10 DIAGNOSIS — I2510 Atherosclerotic heart disease of native coronary artery without angina pectoris: Secondary | ICD-10-CM | POA: Diagnosis not present

## 2017-06-10 DIAGNOSIS — N184 Chronic kidney disease, stage 4 (severe): Secondary | ICD-10-CM | POA: Diagnosis not present

## 2017-06-13 DIAGNOSIS — N184 Chronic kidney disease, stage 4 (severe): Secondary | ICD-10-CM | POA: Diagnosis not present

## 2017-06-13 DIAGNOSIS — R1312 Dysphagia, oropharyngeal phase: Secondary | ICD-10-CM | POA: Diagnosis not present

## 2017-06-13 DIAGNOSIS — J449 Chronic obstructive pulmonary disease, unspecified: Secondary | ICD-10-CM | POA: Diagnosis not present

## 2017-06-13 DIAGNOSIS — I5032 Chronic diastolic (congestive) heart failure: Secondary | ICD-10-CM | POA: Diagnosis not present

## 2017-06-13 DIAGNOSIS — I2510 Atherosclerotic heart disease of native coronary artery without angina pectoris: Secondary | ICD-10-CM | POA: Diagnosis not present

## 2017-06-13 DIAGNOSIS — I130 Hypertensive heart and chronic kidney disease with heart failure and stage 1 through stage 4 chronic kidney disease, or unspecified chronic kidney disease: Secondary | ICD-10-CM | POA: Diagnosis not present

## 2017-06-14 DIAGNOSIS — Z955 Presence of coronary angioplasty implant and graft: Secondary | ICD-10-CM | POA: Diagnosis not present

## 2017-06-14 DIAGNOSIS — I2510 Atherosclerotic heart disease of native coronary artery without angina pectoris: Secondary | ICD-10-CM | POA: Diagnosis not present

## 2017-06-14 DIAGNOSIS — J449 Chronic obstructive pulmonary disease, unspecified: Secondary | ICD-10-CM | POA: Diagnosis not present

## 2017-06-14 DIAGNOSIS — Z87891 Personal history of nicotine dependence: Secondary | ICD-10-CM | POA: Diagnosis not present

## 2017-06-14 DIAGNOSIS — K8689 Other specified diseases of pancreas: Secondary | ICD-10-CM | POA: Diagnosis not present

## 2017-06-14 DIAGNOSIS — Z87440 Personal history of urinary (tract) infections: Secondary | ICD-10-CM | POA: Diagnosis not present

## 2017-06-14 DIAGNOSIS — I5032 Chronic diastolic (congestive) heart failure: Secondary | ICD-10-CM | POA: Diagnosis not present

## 2017-06-14 DIAGNOSIS — M11251 Other chondrocalcinosis, right hip: Secondary | ICD-10-CM | POA: Diagnosis not present

## 2017-06-14 DIAGNOSIS — Z8701 Personal history of pneumonia (recurrent): Secondary | ICD-10-CM | POA: Diagnosis not present

## 2017-06-14 DIAGNOSIS — I130 Hypertensive heart and chronic kidney disease with heart failure and stage 1 through stage 4 chronic kidney disease, or unspecified chronic kidney disease: Secondary | ICD-10-CM | POA: Diagnosis not present

## 2017-06-14 DIAGNOSIS — N184 Chronic kidney disease, stage 4 (severe): Secondary | ICD-10-CM | POA: Diagnosis not present

## 2017-06-14 DIAGNOSIS — M353 Polymyalgia rheumatica: Secondary | ICD-10-CM | POA: Diagnosis not present

## 2017-06-14 DIAGNOSIS — R1312 Dysphagia, oropharyngeal phase: Secondary | ICD-10-CM | POA: Diagnosis not present

## 2017-06-27 ENCOUNTER — Emergency Department (HOSPITAL_COMMUNITY)
Admission: EM | Admit: 2017-06-27 | Discharge: 2017-06-28 | Disposition: A | Discharge status: Home / Self Care | Attending: Emergency Medicine | Admitting: Emergency Medicine

## 2017-06-27 ENCOUNTER — Encounter (HOSPITAL_COMMUNITY): Payer: Self-pay | Admitting: Emergency Medicine

## 2017-06-27 ENCOUNTER — Emergency Department (HOSPITAL_COMMUNITY)

## 2017-06-27 DIAGNOSIS — Z7982 Long term (current) use of aspirin: Secondary | ICD-10-CM | POA: Insufficient documentation

## 2017-06-27 DIAGNOSIS — E039 Hypothyroidism, unspecified: Secondary | ICD-10-CM | POA: Diagnosis not present

## 2017-06-27 DIAGNOSIS — I5032 Chronic diastolic (congestive) heart failure: Secondary | ICD-10-CM | POA: Insufficient documentation

## 2017-06-27 DIAGNOSIS — I130 Hypertensive heart and chronic kidney disease with heart failure and stage 1 through stage 4 chronic kidney disease, or unspecified chronic kidney disease: Secondary | ICD-10-CM | POA: Diagnosis not present

## 2017-06-27 DIAGNOSIS — Y9389 Activity, other specified: Secondary | ICD-10-CM | POA: Diagnosis not present

## 2017-06-27 DIAGNOSIS — S0990XD Unspecified injury of head, subsequent encounter: Secondary | ICD-10-CM | POA: Insufficient documentation

## 2017-06-27 DIAGNOSIS — Y92003 Bedroom of unspecified non-institutional (private) residence as the place of occurrence of the external cause: Secondary | ICD-10-CM | POA: Insufficient documentation

## 2017-06-27 DIAGNOSIS — M542 Cervicalgia: Secondary | ICD-10-CM | POA: Insufficient documentation

## 2017-06-27 DIAGNOSIS — Y999 Unspecified external cause status: Secondary | ICD-10-CM | POA: Diagnosis not present

## 2017-06-27 DIAGNOSIS — W06XXXD Fall from bed, subsequent encounter: Secondary | ICD-10-CM | POA: Diagnosis not present

## 2017-06-27 DIAGNOSIS — S0990XA Unspecified injury of head, initial encounter: Secondary | ICD-10-CM | POA: Diagnosis not present

## 2017-06-27 DIAGNOSIS — J449 Chronic obstructive pulmonary disease, unspecified: Secondary | ICD-10-CM | POA: Diagnosis not present

## 2017-06-27 DIAGNOSIS — R110 Nausea: Secondary | ICD-10-CM | POA: Diagnosis not present

## 2017-06-27 DIAGNOSIS — I2510 Atherosclerotic heart disease of native coronary artery without angina pectoris: Secondary | ICD-10-CM | POA: Insufficient documentation

## 2017-06-27 DIAGNOSIS — Z955 Presence of coronary angioplasty implant and graft: Secondary | ICD-10-CM | POA: Diagnosis not present

## 2017-06-27 DIAGNOSIS — Z7902 Long term (current) use of antithrombotics/antiplatelets: Secondary | ICD-10-CM | POA: Insufficient documentation

## 2017-06-27 DIAGNOSIS — N184 Chronic kidney disease, stage 4 (severe): Secondary | ICD-10-CM | POA: Diagnosis not present

## 2017-06-27 DIAGNOSIS — R404 Transient alteration of awareness: Secondary | ICD-10-CM | POA: Diagnosis not present

## 2017-06-27 DIAGNOSIS — W19XXXA Unspecified fall, initial encounter: Secondary | ICD-10-CM

## 2017-06-27 DIAGNOSIS — E1122 Type 2 diabetes mellitus with diabetic chronic kidney disease: Secondary | ICD-10-CM | POA: Diagnosis not present

## 2017-06-27 DIAGNOSIS — S199XXA Unspecified injury of neck, initial encounter: Secondary | ICD-10-CM | POA: Diagnosis not present

## 2017-06-27 DIAGNOSIS — M62838 Other muscle spasm: Secondary | ICD-10-CM | POA: Diagnosis not present

## 2017-06-27 DIAGNOSIS — R42 Dizziness and giddiness: Secondary | ICD-10-CM | POA: Diagnosis not present

## 2017-06-27 LAB — BASIC METABOLIC PANEL
Anion gap: 8 (ref 5–15)
BUN: 40 mg/dL — AB (ref 6–20)
CO2: 20 mmol/L — ABNORMAL LOW (ref 22–32)
CREATININE: 1.87 mg/dL — AB (ref 0.44–1.00)
Calcium: 8.3 mg/dL — ABNORMAL LOW (ref 8.9–10.3)
Chloride: 111 mmol/L (ref 101–111)
GFR calc Af Amer: 29 mL/min — ABNORMAL LOW (ref >60)
GFR calc non Af Amer: 25 mL/min — ABNORMAL LOW (ref >60)
GLUCOSE: 102 mg/dL — AB (ref 65–99)
POTASSIUM: 5 mmol/L (ref 3.5–5.1)
SODIUM: 139 mmol/L (ref 135–145)

## 2017-06-27 LAB — CBC WITH DIFFERENTIAL/PLATELET
BASOS ABS: 0 10*3/uL (ref 0.0–0.1)
Basophils Relative: 0 %
EOS ABS: 0 10*3/uL (ref 0.0–0.7)
EOS PCT: 0 %
HCT: 32.5 % — ABNORMAL LOW (ref 36.0–46.0)
Hemoglobin: 9.9 g/dL — ABNORMAL LOW (ref 12.0–15.0)
LYMPHS ABS: 1.3 10*3/uL (ref 0.7–4.0)
LYMPHS PCT: 9 %
MCH: 29.6 pg (ref 26.0–34.0)
MCHC: 30.5 g/dL (ref 30.0–36.0)
MCV: 97.3 fL (ref 78.0–100.0)
MONO ABS: 0.4 10*3/uL (ref 0.1–1.0)
Monocytes Relative: 3 %
Neutro Abs: 12.5 10*3/uL — ABNORMAL HIGH (ref 1.7–7.7)
Neutrophils Relative %: 88 %
PLATELETS: 231 10*3/uL (ref 150–400)
RBC: 3.34 MIL/uL — AB (ref 3.87–5.11)
RDW: 15.2 % (ref 11.5–15.5)
WBC: 14.3 10*3/uL — AB (ref 4.0–10.5)

## 2017-06-27 MED ORDER — MORPHINE NICU/PEDS ORAL SYRINGE 0.4 MG/ML: 2.0000 mg | Freq: Once | ORAL | Status: DC

## 2017-06-27 MED ORDER — ONDANSETRON 4 MG PO TBDP
4.0000 mg | ORAL_TABLET | Freq: Once | ORAL | Status: AC
  Administered 2017-06-27: 4 mg via ORAL
  Filled 2017-06-27: qty 1

## 2017-06-27 MED ORDER — MORPHINE SULFATE (PF) 4 MG/ML IV SOLN
2.0000 mg | Freq: Once | INTRAVENOUS | Status: AC
  Administered 2017-06-27: 2 mg via INTRAMUSCULAR
  Filled 2017-06-27: qty 1

## 2017-06-27 NOTE — ED Notes (Signed)
Dr. Laverta Baltimore at [facility], updating pt and family on results and plan.

## 2017-06-27 NOTE — ED Notes (Signed)
ED Provider at bedside. 

## 2017-06-27 NOTE — ED Notes (Signed)
[facility] called for transport.  

## 2017-06-27 NOTE — ED Notes (Signed)
Alert, NAD, calm, interactive, resps e/u, speaking in clear complete sentences, no dyspnea noted, skin W&D, c/o chronic pain, (denies: sob, nausea, dizziness or visual changes). Family at [facility].

## 2017-06-27 NOTE — Discharge Instructions (Signed)
You were seen in the ED today with a head injury. The CT scans were normal. Follow up regarding the urine culture and drink plenty of water. Return to the ED with any new or worsening symptoms.

## 2017-06-27 NOTE — ED Notes (Signed)
DNR & MOST form at [facility]

## 2017-06-27 NOTE — ED Notes (Signed)
Pt from home, mechanical fall on bed, hit the back of her head on the bed railing. Pt c.o cervical pain and posterior head pain

## 2017-06-27 NOTE — ED Notes (Addendum)
No changes. Given apple sauce.

## 2017-06-27 NOTE — ED Triage Notes (Signed)
Per EMS: Pt had mechanical fall on Friday and hit her head. Pt on a blood thinner. Pt A&Ox4 but has a baseline for some forgetfulness. Pt c/o of nausea but not emesis.  Pt family wanted Pt. to come in to get check out.

## 2017-06-27 NOTE — ED Provider Notes (Signed)
Emergency Department Provider Note   I have reviewed the triage vital signs and the nursing notes.   HISTORY  Chief Complaint Fall (on thinners)   HPI Rebecca [name] is a 77 y.o. female with PMH of CKD, CAD, COPD, DM, HLD, and Schizoaffective disorder presents to the emergency department for evaluation after a fall with head injury 3 days prior.  The patient was sitting on the edge of the bed and working with her nursing aide who was not as familiar with the patient.  She fell backwards striking the back of her head. No LOC. Since that time she has been asking for more pain medication than normal and complaining of posterior HA and neck pain. No fever or chills. Patient is currently being treated with Macrobid for UTI and feeling better. Family is following cultures with hospice. Daughter also mentions some muscle twitching that seems new and is wondering about her sodium, which has been low in the past.    Past Medical History:  Diagnosis Date  . AKI (acute kidney injury) ([place])   . Anemia in chronic renal disease   . Aortic atherosclerosis ([place]) 01/27/2017  . CAD (coronary artery disease) 2006   a. s/p DES to RCA in 2006 b. low-risk NST in 2014 c. cath in 08/2015 showing patent RCA stent with nonobstructive disease  . CKD (chronic kidney disease) stage 4, GFR 15-29 ml/min (H[facility])   . Complication of anesthesia   . COPD (chronic obstructive pulmonary disease) ([place])   . Diabetes mellitus   . Diastolic heart failure, NYHA class 1 ([place])   . Dyslipidemia   . Dysuria 01/06/2015  . Frozen shoulder    right  . Gastroesophageal reflux   . HAP (hospital-acquired pneumonia)   . Headache   . Hyperkalemia 01/01/2016  . Hypothyroidism   . 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 Obesity   . Pancreatic mass 01/27/2017  . Pneumonia 06/2016  . Polymyalgia rheumatica ([place])   . PONV (postoperative nausea and vomiting)   . Renal insufficiency   . Respiratory distress     . Schizoaffective disorder ([place])   . Sepsis due to pneumonia ([place])   . Shortness of breath   . Stroke [facility])     Patient Active Problem List   Diagnosis Date Noted  . Poor appetite   . Acute esophagitis   . Poor fluid intake   . Dysphagia 04/27/2017  . Pulmonary infiltrates c/w recurrent aspiration pneumonitis vs hcap 03/30/2017  . Prerenal azotemia 03/30/2017  . Dyspnea on exertion 03/29/2017  . Weakness   . Hypervolemia   . Lethargy   . Pain of right hip joint   . Acute right hip pain 03/14/2017  . Physical debility 03/08/2017  . Recurrent UTI 03/08/2017  . Ascites 03/08/2017  . Chronic pain disorder 03/07/2017  . Chronic constipation   . Community acquired pneumonia   . Palliative care encounter   . Goals of care, counseling/discussion   . TIA (transient ischemic attack) 01/27/2017  . Pancreatic mass 01/27/2017  . Aortic atherosclerosis ([place]) 01/27/2017  . Hyponatremia 01/25/2017  . Generalized weakness 01/25/2017  . Fatigue 01/11/2017  . Mixed hyperlipidemia 12/02/2016  . Encephalopathy 11/20/2016  . Altered mental status 11/19/2016  . Aspiration pneumonia of both lower lobes due to gastric secretions ([place]) 10/10/2016  . Aspiration pneumonia ([place]) 10/07/2016  . Lobar pneumonia, unspecified organism ([place]) 10/07/2016  . Ileus ([place]) 10/07/2016  . AKI (acute kidney injury) ([place]) 09/12/2016  . HAP (  hospital-acquired pneumonia) 09/12/2016  . Respiratory distress 09/12/2016  . Pressure injury of skin 07/15/2016  . Essential hypertension 07/14/2016  . Acute encephalopathy 07/14/2016  . Coronary artery disease involving native coronary artery of native heart without angina pectoris 05/14/2016  . CKD stage 4, GFR 15-29 ml/min  08/11/2015  . 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 Leukocytosis 01/28/2014  . History of TIA - May 2015- Plavix 10/16/2013  . Polymyalgia rheumatica ([place]) 10/03/2012  . Chronic diastolic (congestive) heart failure  ([place]) 07/15/2012  . Trochanteric bursitis of right hip 04/11/2012  . Dysuria 08/30/2011  . History of small bowel obstruction 11/04/2010  . Schizoaffective disorder ([place]) 01/10/2008  . Dyslipidemia 01/25/2007  . Hypothyroidism 08/11/2006  . Anemia in chronic renal disease 08/11/2006  . GASTROESOPHAGEAL REFLUX, NO ESOPHAGITIS 08/11/2006    Past Surgical History:  Procedure Laterality Date  . ABDOMINAL HYSTERECTOMY    . CARDIAC CATHETERIZATION N/A 08/13/2015   Procedure: Left Heart Cath and Coronary Angiography;  Surgeon: Jettie Booze, MD;  Location: [place] [facility];  Service: Cardiovascular;  Laterality: N/A;  . CATARACT EXTRACTION    . CHOLECYSTECTOMY    . CORONARY ANGIOPLASTY WITH STENT PLACEMENT  2006   [place]us DES to RCA, 50 % residual  . PARTIAL HYSTERECTOMY  1995    Current Outpatient Rx  . Order #: [id number] Class: Normal  . Order #: [id number] Class: Normal  . Order #: [id number] Class: Historical Med  . Order #: [id number] Class: Normal  . Order #: [id number] Class: Normal  . Order #: [id number] Class: Normal  . Order #: [id number] Class: Historical Med  . Order #: [id number] Class: Historical Med  . Order #: [id number] Class: Historical Med  . Order #: [id number] Class: Historical Med  . Order #: [id number] Class: Historical Med  . Order #: [id number] Class: Historical Med  . Order #: [id number] Class: Historical Med  . Order #: [id number] Class: Normal  . Order #: [id number] Class: Historical Med  . Order #: [id number] Class: Normal  . Order #: [id number] Class: Historical Med  . Order #: [id number] Class: Historical Med  . Order #: [id number] Class: Historical Med  . Order #: [id number] Class: Normal  . Order #: [id number] Class: Historical Med  . Order #: [id number] Class: Historical Med  . Order #: [id number] Class: Normal  . Order #: [id number] Class: Print  . Order #: [id number] Class: Normal  . Order #: [id number] Class: No Print  . Order #: [id number] Class: Historical Med  . Order #:  [id number] Class: Print  . Order #: [id number] Class: Historical Med  . Order #: [id number] Class: Historical Med  . Order #: [id number] Class: No Print  . Order #: [id number] Class: Historical Med  . Order #: [id number] Class: Normal    Allergies Levaquin [levofloxacin] and Tape  Family History  Problem Relation Age of Onset  . Diabetes Mother   . Heart disease Mother   . Heart disease Father   . Arthritis Father   . Healthy Sister   . Heart disease Brother   . Cancer Brother        colon ca  . Osteoporosis Sister   . Heart disease Brother   . Diabetes Brother   . Heart disease Brother   . Diabetes Brother   . Diabetes Brother   . Healthy Brother   . Healthy Brother   . Healthy Brother   . Cancer Maternal Uncle        blood condition  . Cancer Daughter        breast ca  . Cancer Cousin        NHL  Social History Social History   Tobacco Use  . Smoking status: Former Smoker    Packs/day: 1.00    Years: 35.00    Pack years: 35.00    Last attempt to quit: 04/14/2004    Years since quitting: 13.2  . Smokeless tobacco: Never Used  Substance Use Topics  . Alcohol use: No  . Drug use: No    Review of Systems  Constitutional: No fever/chills Eyes: No visual changes. ENT: No sore throat. Cardiovascular: Denies chest pain. Respiratory: Denies shortness of breath. Gastrointestinal: No abdominal pain.  No nausea, no vomiting.  No diarrhea.  No constipation. Genitourinary: Negative for dysuria. Musculoskeletal: Negative for back pain. Skin: Negative for rash. Neurological: Negative for focal weakness or numbness. Positive HA/neck pain.   10-point ROS otherwise negative.  ____________________________________________   PHYSICAL EXAM:  VITAL SIGNS: ED Triage Vitals  Enc Vitals Group     BP 06/27/17 1803 105/63     Pulse Rate 06/27/17 1803 86     Resp 06/27/17 1803 20     Temp 06/27/17 1806 97.9 F (36.6 C)     Temp Source 06/27/17 1806 Oral     SpO2 06/27/17  1803 97 %     Weight 06/27/17 1759 140 lb (63.5 kg)     Height 06/27/17 1759 5\' 3"  (1.6 m)     Pain Score 06/27/17 1759 6   Constitutional: Alert. Well appearing and in no acute distress. Eyes: Conjunctivae are normal. PERRL Head: Atraumatic. Nose: No congestion/rhinnorhea. Mouth/Throat: Mucous membranes are moist.  Neck: No stridor. Mild paraspinal cervical tenderness.  Cardiovascular: Normal rate, regular rhythm. Good peripheral circulation. Grossly normal heart sounds.   Respiratory: Normal respiratory effort.  No retractions. Lungs CTAB. Gastrointestinal: Soft and nontender. No distention.  Musculoskeletal: No lower extremity tenderness nor edema. No gross deformities of extremities. Neurologic:  Normal speech and language. No gross focal neurologic deficits are appreciated.  Skin:  Skin is warm, dry and intact. No rash noted.   ____________________________________________   LABS (all labs ordered are listed, but only abnormal results are displayed)  Labs Reviewed  BASIC METABOLIC PANEL - Abnormal; Notable for the following components:      Result Value   CO2 20 (*)    Glucose, Bld 102 (*)    BUN 40 (*)    Creatinine, Ser 1.87 (*)    Calcium 8.3 (*)    GFR calc non Af Amer 25 (*)    GFR calc Af Amer 29 (*)    All other components within normal limits  CBC WITH DIFFERENTIAL/PLATELET - Abnormal; Notable for the following components:   WBC 14.3 (*)    RBC 3.34 (*)    Hemoglobin 9.9 (*)    HCT 32.5 (*)    Neutro Abs 12.5 (*)    All other components within normal limits   ____________________________________________  EKG   EKG Interpretation  Date/Time:  Monday June 27 2017 18:04:35 EST Ventricular Rate:  86 PR Interval:    QRS Duration: 86 QT Interval:  372 QTC Calculation: 445 R Axis:   10 Text Interpretation:  Sinus rhythm Probable LVH with secondary repol abnrm No STEMI. Similar to prior.  Confirmed by Nanda Quinton [phone]) on 06/27/2017 6:13:49 PM        ____________________________________________  RADIOLOGY  Ct Head Wo Contrast  Result Date: 06/27/2017 CLINICAL DATA:  77 year old who fell 3 days ago, striking her head. Patient currently anticoagulated with clopidogrel. EXAM: CT HEAD WITHOUT CONTRAST CT CERVICAL SPINE WITHOUT  CONTRAST TECHNIQUE: Multidetector CT imaging of the head and cervical spine was performed following the standard protocol without intravenous contrast. Multiplanar CT image reconstructions of the cervical spine were also generated. COMPARISON:  CT head 01/25/2017, 10/19/2016 and earlier. CT cervical spine 10/19/2016, 09/12/2016 and earlier. MRI brain 01/25/2017. FINDINGS: CT HEAD FINDINGS Brain: Moderate age related cortical and deep atrophy. Moderate to severe changes of small vessel disease of the white matter diffusely. Remote lacunar strokes in the basal ganglia bilaterally. No mass lesion. No midline shift. No acute hemorrhage or hematoma. No extra-axial fluid collections. No evidence of acute infarction. Vascular: Moderate bilateral carotid siphon and vertebral artery atherosclerosis. No hyperdense vessel. Skull: No skull fracture or other focal osseous abnormality involving the skull. Sinuses/Orbits: Visualized paranasal sinuses, bilateral mastoid air cells and bilateral middle ear cavities well-aerated. Visualized orbits and globes normal in appearance. Other: None. CT CERVICAL SPINE FINDINGS Alignment: Reversal of the usual cervical lordosis. Anatomic posterior alignment. Skull base and vertebrae: Osseous demineralization. Nondisplaced well corticated fracture involving the tip of the left facet at C6, not likely acute. Facet joints otherwise intact. No acute fractures identified involving the cervical spine. Coronal reformatted images demonstrate an intact craniocervical junction, intact dens with erosions, and intact lateral masses throughout. Extensive calcified pannus posterior and lateral to the dens. Soft tissues and  spinal canal: No evidence of spinal canal hematoma. Calcification involving the posterior longitudinal ligament at C3-4, C5, and upper C7. Calcification involving the ligamentum flavum a at C6-7. Severe multifactorial spinal stenosis at the C4-5, C5-6 and C6-7 levels. Disc levels: Disc space narrowing and endplate hypertrophic changes at every cervical level, greatest at C3-4. Bridging anterior osteophytes at C3-4, C4-5, and C5-6. Uncinate and facet hypertrophy account for multilevel foraminal stenoses including mild bilateral C2-3, severe bilateral C3-4, severe bilateral C4-5, severe left and moderate right C5-6. Upper chest: Visualized lung apices clear. Mild atherosclerosis involving the proximal great vessels. Other: Mild bilateral cervical carotid atherosclerosis. IMPRESSION: Brain: 1. No acute intracranial abnormality. 2. Stable moderate generalized atrophy and moderate chronic microvascular ischemic changes of the white matter diffusely. 3. Remote lacunar strokes in the basal ganglia bilaterally. Cervical spine: 1. No acute fractures identified. 2. Possible remote fracture involving the tip of the left facet at C6. This does not appear acute as it is well corticated. 3. Degenerative changes throughout the cervical spine as detailed above. Multifactorial spinal stenosis at C4-5, C5-6 and C6-7 levels. Electronically Signed   By: Evangeline Dakin M.D.   On: 06/27/2017 19:24   Ct Cervical Spine Wo Contrast  Result Date: 06/27/2017 CLINICAL DATA:  77 year old who fell 3 days ago, striking her head. Patient currently anticoagulated with clopidogrel. EXAM: CT HEAD WITHOUT CONTRAST CT CERVICAL SPINE WITHOUT CONTRAST TECHNIQUE: Multidetector CT imaging of the head and cervical spine was performed following the standard protocol without intravenous contrast. Multiplanar CT image reconstructions of the cervical spine were also generated. COMPARISON:  CT head 01/25/2017, 10/19/2016 and earlier. CT cervical spine  10/19/2016, 09/12/2016 and earlier. MRI brain 01/25/2017. FINDINGS: CT HEAD FINDINGS Brain: Moderate age related cortical and deep atrophy. Moderate to severe changes of small vessel disease of the white matter diffusely. Remote lacunar strokes in the basal ganglia bilaterally. No mass lesion. No midline shift. No acute hemorrhage or hematoma. No extra-axial fluid collections. No evidence of acute infarction. Vascular: Moderate bilateral carotid siphon and vertebral artery atherosclerosis. No hyperdense vessel. Skull: No skull fracture or other focal osseous abnormality involving the skull. Sinuses/Orbits: Visualized paranasal sinuses, bilateral mastoid air  cells and bilateral middle ear cavities well-aerated. Visualized orbits and globes normal in appearance. Other: None. CT CERVICAL SPINE FINDINGS Alignment: Reversal of the usual cervical lordosis. Anatomic posterior alignment. Skull base and vertebrae: Osseous demineralization. Nondisplaced well corticated fracture involving the tip of the left facet at C6, not likely acute. Facet joints otherwise intact. No acute fractures identified involving the cervical spine. Coronal reformatted images demonstrate an intact craniocervical junction, intact dens with erosions, and intact lateral masses throughout. Extensive calcified pannus posterior and lateral to the dens. Soft tissues and spinal canal: No evidence of spinal canal hematoma. Calcification involving the posterior longitudinal ligament at C3-4, C5, and upper C7. Calcification involving the ligamentum flavum a at C6-7. Severe multifactorial spinal stenosis at the C4-5, C5-6 and C6-7 levels. Disc levels: Disc space narrowing and endplate hypertrophic changes at every cervical level, greatest at C3-4. Bridging anterior osteophytes at C3-4, C4-5, and C5-6. Uncinate and facet hypertrophy account for multilevel foraminal stenoses including mild bilateral C2-3, severe bilateral C3-4, severe bilateral C4-5, severe left  and moderate right C5-6. Upper chest: Visualized lung apices clear. Mild atherosclerosis involving the proximal great vessels. Other: Mild bilateral cervical carotid atherosclerosis. IMPRESSION: Brain: 1. No acute intracranial abnormality. 2. Stable moderate generalized atrophy and moderate chronic microvascular ischemic changes of the white matter diffusely. 3. Remote lacunar strokes in the basal ganglia bilaterally. Cervical spine: 1. No acute fractures identified. 2. Possible remote fracture involving the tip of the left facet at C6. This does not appear acute as it is well corticated. 3. Degenerative changes throughout the cervical spine as detailed above. Multifactorial spinal stenosis at C4-5, C5-6 and C6-7 levels. Electronically Signed   By: Evangeline Dakin M.D.   On: 06/27/2017 19:24    ____________________________________________   PROCEDURES  Procedure(s) performed:   Procedures  None ____________________________________________   INITIAL IMPRESSION / ASSESSMENT AND PLAN / ED COURSE  Pertinent labs & imaging results that were available during my care of the patient were reviewed by me and considered in my medical decision making (see chart for details).  Patient presents to the emergency department several days after minor head injury.  Well-appearing but complaining of posterior headache and neck discomfort over the last several days.  Patient is on Plavix and ASA. Plan for CT head and cervical spine. Will obtain labs with report of muscle twitching report per family. No UA. Patient is currently being treated with abx and has cultures pending.   CT imaging and labs are unremarkable. Plan for discharge back to SNF.   At this time, I do not feel there is any life-threatening condition present. I have reviewed and discussed all results (EKG, imaging, lab, urine as appropriate), exam findings with patient. I have reviewed nursing notes and appropriate previous records.  I feel the  patient is safe to be discharged home without further emergent workup. Discussed usual and customary return precautions. Patient and family (if present) verbalize understanding and are comfortable with this plan.  Patient will follow-up with their primary care provider. If they do not have a primary care provider, information for follow-up has been provided to them. All questions have been answered.  ____________________________________________  FINAL CLINICAL IMPRESSION(S) / ED DIAGNOSES  Final diagnoses:  Fall, initial encounter    MEDICATIONS GIVEN DURING THIS VISIT:  Medications  morphine 4 MG/ML injection 2 mg (2 mg Intramuscular Given 06/27/17 2010)  ondansetron (ZOFRAN-ODT) disintegrating tablet 4 mg (4 mg Oral Given 06/27/17 2010)    Note:  This document was  prepared using Systems analyst and may include unintentional dictation errors.  Nanda Quinton, MD Emergency Medicine    [place], [facility], MD 06/27/17 [phone]

## 2017-06-28 DIAGNOSIS — G8911 Acute pain due to trauma: Secondary | ICD-10-CM | POA: Diagnosis not present

## 2017-06-28 DIAGNOSIS — S0990XA Unspecified injury of head, initial encounter: Secondary | ICD-10-CM | POA: Diagnosis not present

## 2017-06-28 MED ORDER — NITROFURANTOIN MONOHYD MACRO 100 MG PO CAPS
100.0000 mg | ORAL_CAPSULE | Freq: Once | ORAL | Status: DC
  Filled 2017-06-28: qty 1

## 2017-06-28 NOTE — ED Notes (Signed)
Family present. Out with [facility].

## 2017-07-10 ENCOUNTER — Other Ambulatory Visit: Payer: Self-pay | Admitting: Podiatry

## 2017-07-10 IMAGING — CR DG CHEST 2V
2 series · 2 of 2 positions shown · non-contrast
Comparison: 10/16/2013

CLINICAL DATA: Chest pain for 1 day

EXAM:
CHEST  2 VIEW

[chest lat]
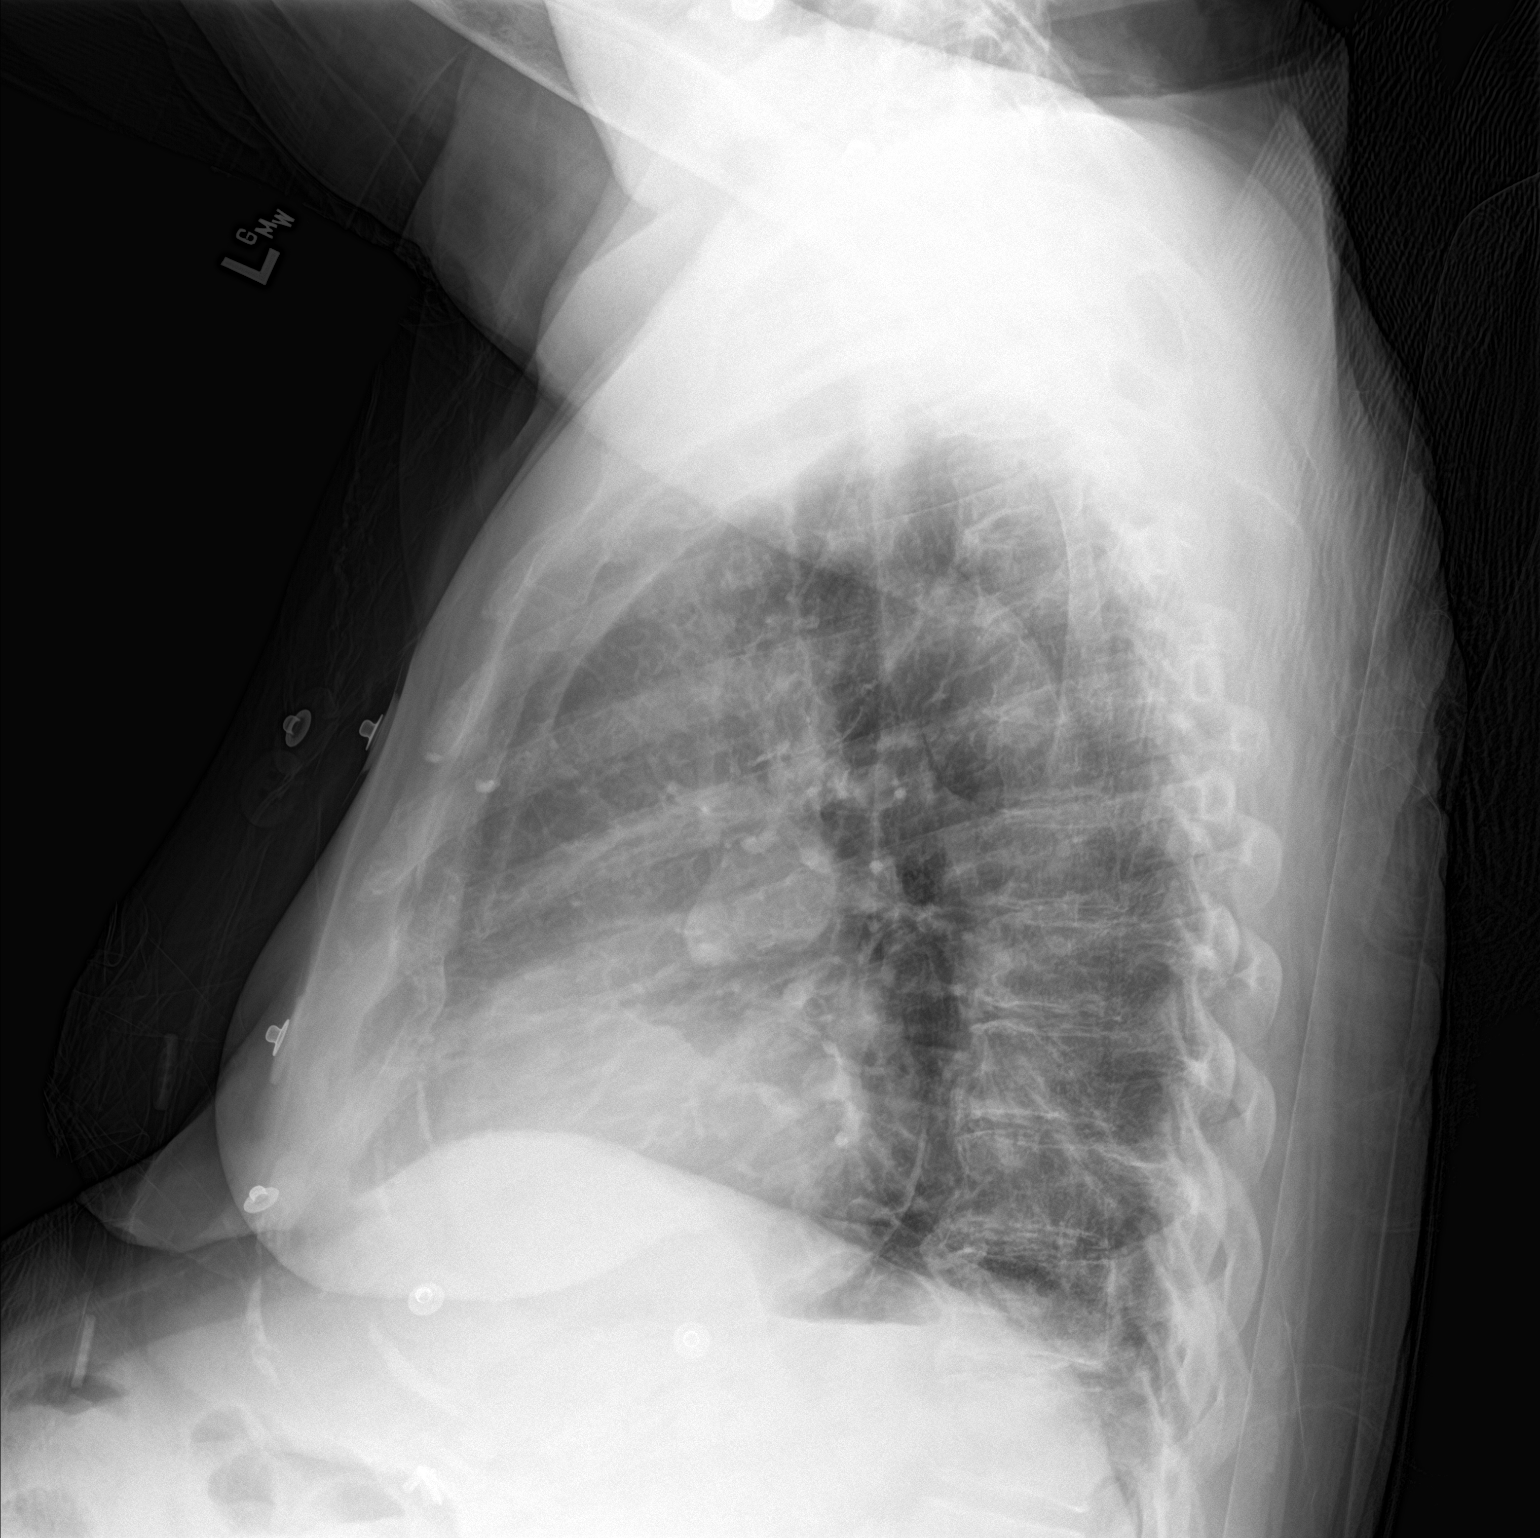

[chest ap]
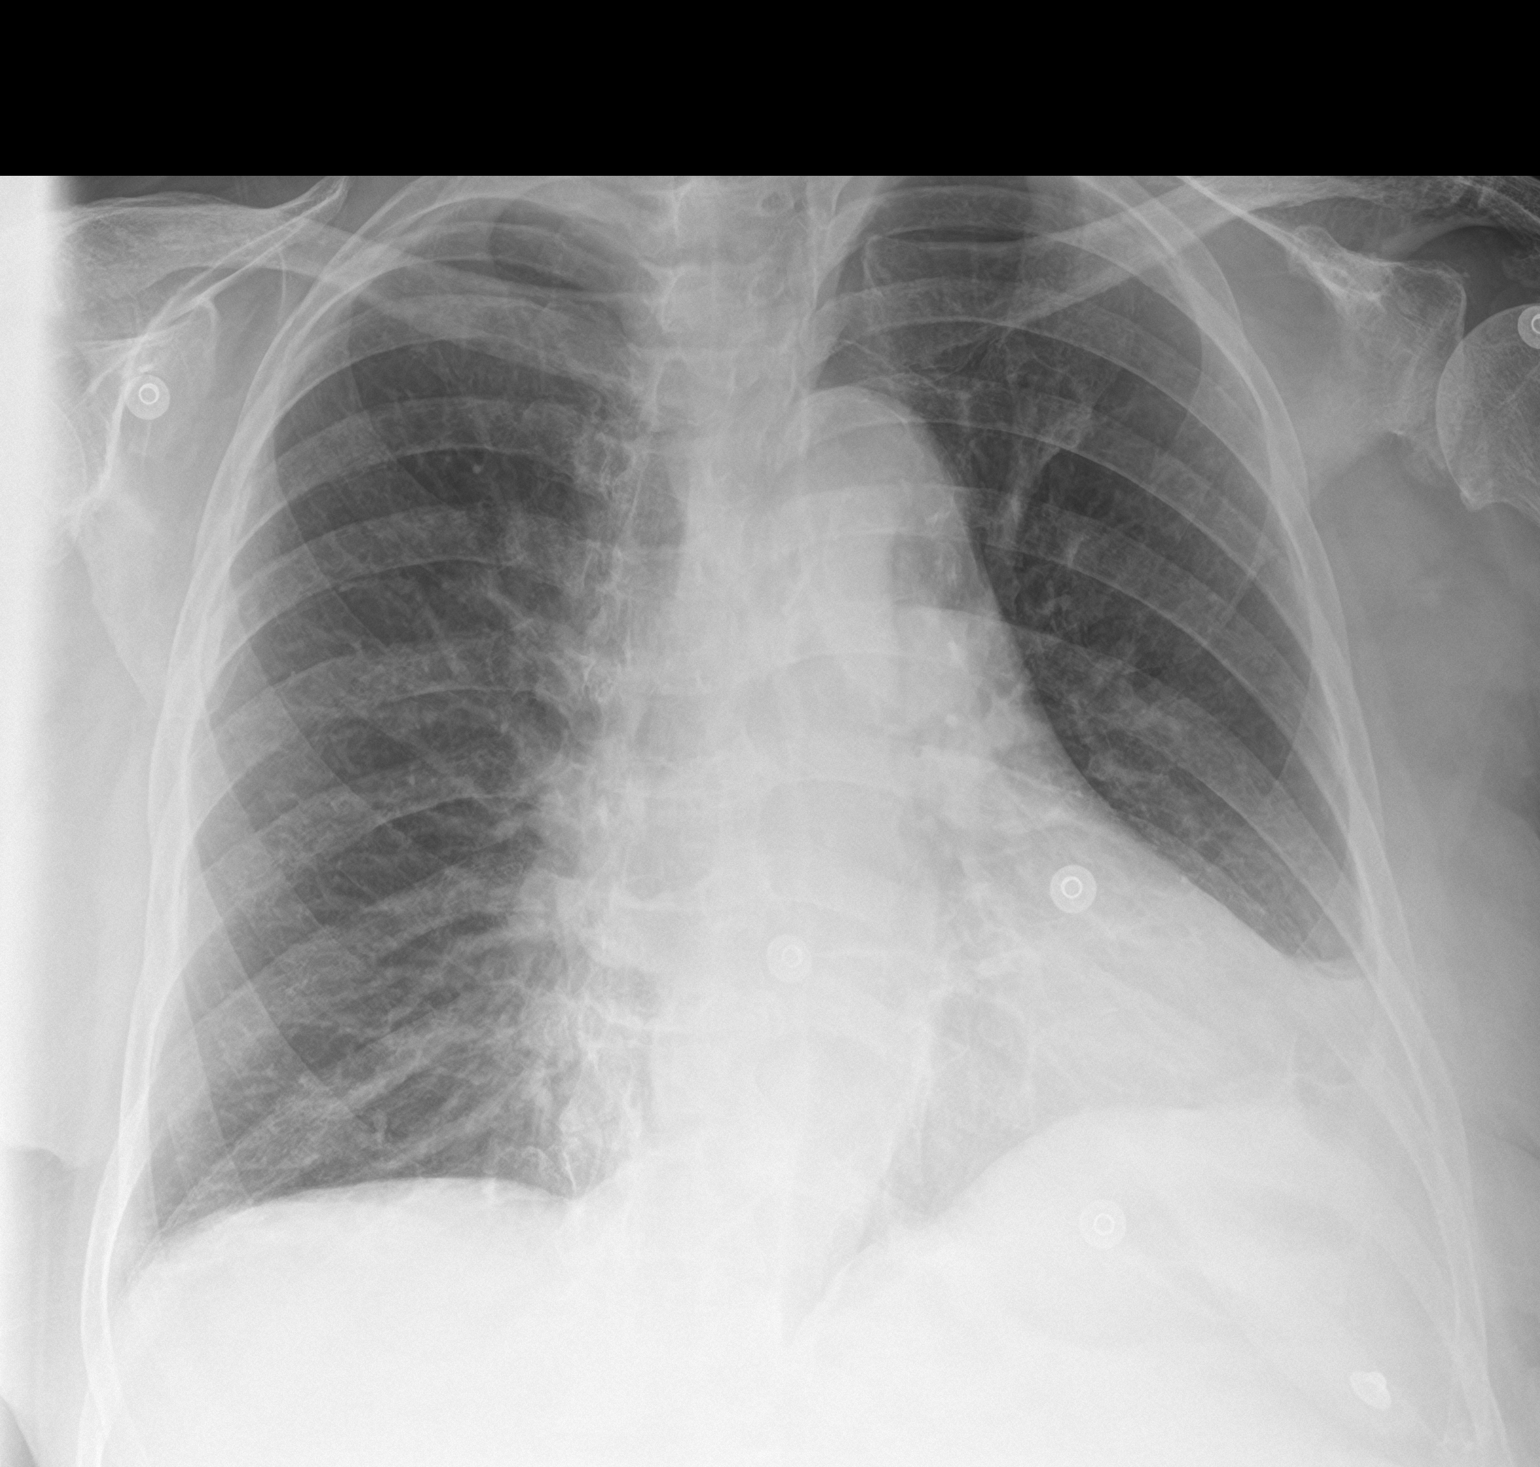

[2 of 2 positions shown; findings below may reference images not displayed]

FINDINGS: Cardiac shadow is mildly enlarged but stable. The lungs are well
aerated bilaterally. Chronic blunting of the left costophrenic angle
is seen. No acute infiltrate is noted. No acute bony abnormality is
seen.
IMPRESSION: No active cardiopulmonary disease.

## 2017-07-11 IMAGING — CR DG PORTABLE PELVIS
1 series · 1 of 1 positions shown · non-contrast
Comparison: AP pelvis of September 26, 2012.

CLINICAL DATA: Right hip pain, monoclonal gammopathy, polymyalgia
rheumatica.

EXAM:
PORTABLE PELVIS 1-2 VIEWS

[AP]
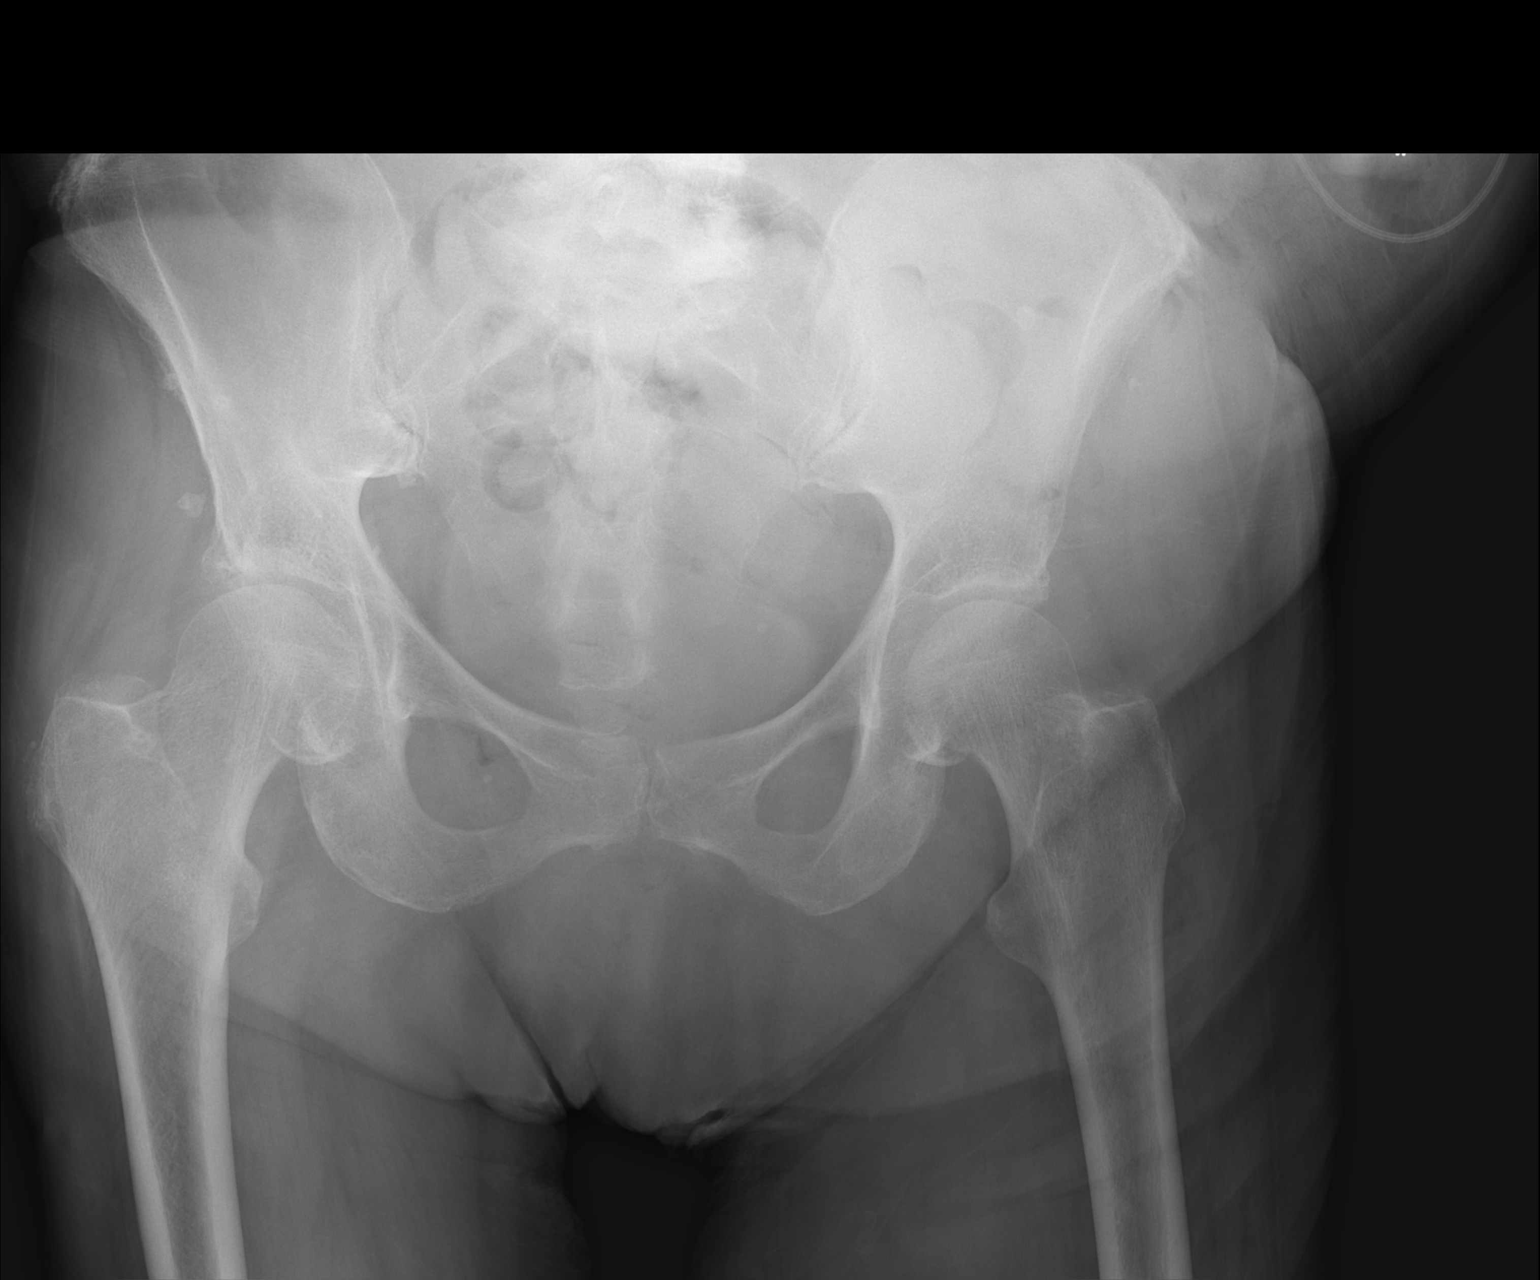

[1 of 1 positions shown; findings below may reference images not displayed]

FINDINGS: Bowel gas limits evaluation of portions of the sacrum and iliac
bones. The bony pelvis is adequately mineralized. There is no lytic
or blastic lesion. There is no acute fracture nor dislocation. The
sacrum and SI joints are normal. The right hip exhibits mild
degenerative joint space loss.
IMPRESSION: No lytic or blastic bony lesion is observed. There is mild
degenerative change of the right hip.

## 2017-07-15 DIAGNOSIS — Z8701 Personal history of pneumonia (recurrent): Secondary | ICD-10-CM | POA: Diagnosis not present

## 2017-07-15 DIAGNOSIS — J449 Chronic obstructive pulmonary disease, unspecified: Secondary | ICD-10-CM | POA: Diagnosis not present

## 2017-07-15 DIAGNOSIS — I5032 Chronic diastolic (congestive) heart failure: Secondary | ICD-10-CM | POA: Diagnosis not present

## 2017-07-15 DIAGNOSIS — Z955 Presence of coronary angioplasty implant and graft: Secondary | ICD-10-CM | POA: Diagnosis not present

## 2017-07-15 DIAGNOSIS — Z87891 Personal history of nicotine dependence: Secondary | ICD-10-CM | POA: Diagnosis not present

## 2017-07-15 DIAGNOSIS — N184 Chronic kidney disease, stage 4 (severe): Secondary | ICD-10-CM | POA: Diagnosis not present

## 2017-07-15 DIAGNOSIS — R1312 Dysphagia, oropharyngeal phase: Secondary | ICD-10-CM | POA: Diagnosis not present

## 2017-07-15 DIAGNOSIS — I2510 Atherosclerotic heart disease of native coronary artery without angina pectoris: Secondary | ICD-10-CM | POA: Diagnosis not present

## 2017-07-15 DIAGNOSIS — K8689 Other specified diseases of pancreas: Secondary | ICD-10-CM | POA: Diagnosis not present

## 2017-07-15 DIAGNOSIS — M353 Polymyalgia rheumatica: Secondary | ICD-10-CM | POA: Diagnosis not present

## 2017-07-15 DIAGNOSIS — Z87440 Personal history of urinary (tract) infections: Secondary | ICD-10-CM | POA: Diagnosis not present

## 2017-07-15 DIAGNOSIS — M11251 Other chondrocalcinosis, right hip: Secondary | ICD-10-CM | POA: Diagnosis not present

## 2017-07-15 DIAGNOSIS — I130 Hypertensive heart and chronic kidney disease with heart failure and stage 1 through stage 4 chronic kidney disease, or unspecified chronic kidney disease: Secondary | ICD-10-CM | POA: Diagnosis not present

## 2017-07-18 DIAGNOSIS — I2510 Atherosclerotic heart disease of native coronary artery without angina pectoris: Secondary | ICD-10-CM | POA: Diagnosis not present

## 2017-07-18 DIAGNOSIS — J449 Chronic obstructive pulmonary disease, unspecified: Secondary | ICD-10-CM | POA: Diagnosis not present

## 2017-07-18 DIAGNOSIS — N184 Chronic kidney disease, stage 4 (severe): Secondary | ICD-10-CM | POA: Diagnosis not present

## 2017-07-18 DIAGNOSIS — R1312 Dysphagia, oropharyngeal phase: Secondary | ICD-10-CM | POA: Diagnosis not present

## 2017-07-18 DIAGNOSIS — I5032 Chronic diastolic (congestive) heart failure: Secondary | ICD-10-CM | POA: Diagnosis not present

## 2017-07-18 DIAGNOSIS — I130 Hypertensive heart and chronic kidney disease with heart failure and stage 1 through stage 4 chronic kidney disease, or unspecified chronic kidney disease: Secondary | ICD-10-CM | POA: Diagnosis not present

## 2017-07-19 ENCOUNTER — Other Ambulatory Visit: Payer: Self-pay | Admitting: Adult Health

## 2017-07-20 DIAGNOSIS — N184 Chronic kidney disease, stage 4 (severe): Secondary | ICD-10-CM | POA: Diagnosis not present

## 2017-07-20 DIAGNOSIS — I2510 Atherosclerotic heart disease of native coronary artery without angina pectoris: Secondary | ICD-10-CM | POA: Diagnosis not present

## 2017-07-20 DIAGNOSIS — R1312 Dysphagia, oropharyngeal phase: Secondary | ICD-10-CM | POA: Diagnosis not present

## 2017-07-20 DIAGNOSIS — J449 Chronic obstructive pulmonary disease, unspecified: Secondary | ICD-10-CM | POA: Diagnosis not present

## 2017-07-20 DIAGNOSIS — I5032 Chronic diastolic (congestive) heart failure: Secondary | ICD-10-CM | POA: Diagnosis not present

## 2017-07-20 DIAGNOSIS — I130 Hypertensive heart and chronic kidney disease with heart failure and stage 1 through stage 4 chronic kidney disease, or unspecified chronic kidney disease: Secondary | ICD-10-CM | POA: Diagnosis not present

## 2017-07-21 DIAGNOSIS — J449 Chronic obstructive pulmonary disease, unspecified: Secondary | ICD-10-CM | POA: Diagnosis not present

## 2017-07-21 DIAGNOSIS — I5032 Chronic diastolic (congestive) heart failure: Secondary | ICD-10-CM | POA: Diagnosis not present

## 2017-07-21 DIAGNOSIS — I130 Hypertensive heart and chronic kidney disease with heart failure and stage 1 through stage 4 chronic kidney disease, or unspecified chronic kidney disease: Secondary | ICD-10-CM | POA: Diagnosis not present

## 2017-07-21 DIAGNOSIS — R1312 Dysphagia, oropharyngeal phase: Secondary | ICD-10-CM | POA: Diagnosis not present

## 2017-07-21 DIAGNOSIS — I2510 Atherosclerotic heart disease of native coronary artery without angina pectoris: Secondary | ICD-10-CM | POA: Diagnosis not present

## 2017-07-21 DIAGNOSIS — N184 Chronic kidney disease, stage 4 (severe): Secondary | ICD-10-CM | POA: Diagnosis not present

## 2017-07-22 DIAGNOSIS — N184 Chronic kidney disease, stage 4 (severe): Secondary | ICD-10-CM | POA: Diagnosis not present

## 2017-07-22 DIAGNOSIS — I130 Hypertensive heart and chronic kidney disease with heart failure and stage 1 through stage 4 chronic kidney disease, or unspecified chronic kidney disease: Secondary | ICD-10-CM | POA: Diagnosis not present

## 2017-07-22 DIAGNOSIS — I2510 Atherosclerotic heart disease of native coronary artery without angina pectoris: Secondary | ICD-10-CM | POA: Diagnosis not present

## 2017-07-22 DIAGNOSIS — I5032 Chronic diastolic (congestive) heart failure: Secondary | ICD-10-CM | POA: Diagnosis not present

## 2017-07-22 DIAGNOSIS — J449 Chronic obstructive pulmonary disease, unspecified: Secondary | ICD-10-CM | POA: Diagnosis not present

## 2017-07-22 DIAGNOSIS — R1312 Dysphagia, oropharyngeal phase: Secondary | ICD-10-CM | POA: Diagnosis not present

## 2017-07-25 DIAGNOSIS — R1312 Dysphagia, oropharyngeal phase: Secondary | ICD-10-CM | POA: Diagnosis not present

## 2017-07-25 DIAGNOSIS — N184 Chronic kidney disease, stage 4 (severe): Secondary | ICD-10-CM | POA: Diagnosis not present

## 2017-07-25 DIAGNOSIS — J449 Chronic obstructive pulmonary disease, unspecified: Secondary | ICD-10-CM | POA: Diagnosis not present

## 2017-07-25 DIAGNOSIS — I130 Hypertensive heart and chronic kidney disease with heart failure and stage 1 through stage 4 chronic kidney disease, or unspecified chronic kidney disease: Secondary | ICD-10-CM | POA: Diagnosis not present

## 2017-07-25 DIAGNOSIS — I5032 Chronic diastolic (congestive) heart failure: Secondary | ICD-10-CM | POA: Diagnosis not present

## 2017-07-25 DIAGNOSIS — I2510 Atherosclerotic heart disease of native coronary artery without angina pectoris: Secondary | ICD-10-CM | POA: Diagnosis not present

## 2017-07-27 DIAGNOSIS — R1312 Dysphagia, oropharyngeal phase: Secondary | ICD-10-CM | POA: Diagnosis not present

## 2017-07-27 DIAGNOSIS — J449 Chronic obstructive pulmonary disease, unspecified: Secondary | ICD-10-CM | POA: Diagnosis not present

## 2017-07-27 DIAGNOSIS — I130 Hypertensive heart and chronic kidney disease with heart failure and stage 1 through stage 4 chronic kidney disease, or unspecified chronic kidney disease: Secondary | ICD-10-CM | POA: Diagnosis not present

## 2017-07-27 DIAGNOSIS — I5032 Chronic diastolic (congestive) heart failure: Secondary | ICD-10-CM | POA: Diagnosis not present

## 2017-07-27 DIAGNOSIS — I2510 Atherosclerotic heart disease of native coronary artery without angina pectoris: Secondary | ICD-10-CM | POA: Diagnosis not present

## 2017-07-27 DIAGNOSIS — N184 Chronic kidney disease, stage 4 (severe): Secondary | ICD-10-CM | POA: Diagnosis not present

## 2017-07-28 DIAGNOSIS — R1312 Dysphagia, oropharyngeal phase: Secondary | ICD-10-CM | POA: Diagnosis not present

## 2017-07-28 DIAGNOSIS — I2510 Atherosclerotic heart disease of native coronary artery without angina pectoris: Secondary | ICD-10-CM | POA: Diagnosis not present

## 2017-07-28 DIAGNOSIS — J449 Chronic obstructive pulmonary disease, unspecified: Secondary | ICD-10-CM | POA: Diagnosis not present

## 2017-07-28 DIAGNOSIS — I5032 Chronic diastolic (congestive) heart failure: Secondary | ICD-10-CM | POA: Diagnosis not present

## 2017-07-28 DIAGNOSIS — N184 Chronic kidney disease, stage 4 (severe): Secondary | ICD-10-CM | POA: Diagnosis not present

## 2017-07-28 DIAGNOSIS — I130 Hypertensive heart and chronic kidney disease with heart failure and stage 1 through stage 4 chronic kidney disease, or unspecified chronic kidney disease: Secondary | ICD-10-CM | POA: Diagnosis not present

## 2017-07-29 DIAGNOSIS — J449 Chronic obstructive pulmonary disease, unspecified: Secondary | ICD-10-CM | POA: Diagnosis not present

## 2017-07-29 DIAGNOSIS — N184 Chronic kidney disease, stage 4 (severe): Secondary | ICD-10-CM | POA: Diagnosis not present

## 2017-07-29 DIAGNOSIS — R1312 Dysphagia, oropharyngeal phase: Secondary | ICD-10-CM | POA: Diagnosis not present

## 2017-07-29 DIAGNOSIS — I2510 Atherosclerotic heart disease of native coronary artery without angina pectoris: Secondary | ICD-10-CM | POA: Diagnosis not present

## 2017-07-29 DIAGNOSIS — I130 Hypertensive heart and chronic kidney disease with heart failure and stage 1 through stage 4 chronic kidney disease, or unspecified chronic kidney disease: Secondary | ICD-10-CM | POA: Diagnosis not present

## 2017-07-29 DIAGNOSIS — I5032 Chronic diastolic (congestive) heart failure: Secondary | ICD-10-CM | POA: Diagnosis not present

## 2017-08-01 DIAGNOSIS — J449 Chronic obstructive pulmonary disease, unspecified: Secondary | ICD-10-CM | POA: Diagnosis not present

## 2017-08-01 DIAGNOSIS — I2510 Atherosclerotic heart disease of native coronary artery without angina pectoris: Secondary | ICD-10-CM | POA: Diagnosis not present

## 2017-08-01 DIAGNOSIS — I5032 Chronic diastolic (congestive) heart failure: Secondary | ICD-10-CM | POA: Diagnosis not present

## 2017-08-01 DIAGNOSIS — R1312 Dysphagia, oropharyngeal phase: Secondary | ICD-10-CM | POA: Diagnosis not present

## 2017-08-01 DIAGNOSIS — N184 Chronic kidney disease, stage 4 (severe): Secondary | ICD-10-CM | POA: Diagnosis not present

## 2017-08-01 DIAGNOSIS — I130 Hypertensive heart and chronic kidney disease with heart failure and stage 1 through stage 4 chronic kidney disease, or unspecified chronic kidney disease: Secondary | ICD-10-CM | POA: Diagnosis not present

## 2017-08-02 DIAGNOSIS — J449 Chronic obstructive pulmonary disease, unspecified: Secondary | ICD-10-CM | POA: Diagnosis not present

## 2017-08-02 DIAGNOSIS — I2510 Atherosclerotic heart disease of native coronary artery without angina pectoris: Secondary | ICD-10-CM | POA: Diagnosis not present

## 2017-08-02 DIAGNOSIS — I130 Hypertensive heart and chronic kidney disease with heart failure and stage 1 through stage 4 chronic kidney disease, or unspecified chronic kidney disease: Secondary | ICD-10-CM | POA: Diagnosis not present

## 2017-08-02 DIAGNOSIS — R1312 Dysphagia, oropharyngeal phase: Secondary | ICD-10-CM | POA: Diagnosis not present

## 2017-08-02 DIAGNOSIS — I5032 Chronic diastolic (congestive) heart failure: Secondary | ICD-10-CM | POA: Diagnosis not present

## 2017-08-02 DIAGNOSIS — N184 Chronic kidney disease, stage 4 (severe): Secondary | ICD-10-CM | POA: Diagnosis not present

## 2017-08-03 DIAGNOSIS — R1312 Dysphagia, oropharyngeal phase: Secondary | ICD-10-CM | POA: Diagnosis not present

## 2017-08-03 DIAGNOSIS — I5032 Chronic diastolic (congestive) heart failure: Secondary | ICD-10-CM | POA: Diagnosis not present

## 2017-08-03 DIAGNOSIS — I130 Hypertensive heart and chronic kidney disease with heart failure and stage 1 through stage 4 chronic kidney disease, or unspecified chronic kidney disease: Secondary | ICD-10-CM | POA: Diagnosis not present

## 2017-08-03 DIAGNOSIS — I2510 Atherosclerotic heart disease of native coronary artery without angina pectoris: Secondary | ICD-10-CM | POA: Diagnosis not present

## 2017-08-03 DIAGNOSIS — J449 Chronic obstructive pulmonary disease, unspecified: Secondary | ICD-10-CM | POA: Diagnosis not present

## 2017-08-03 DIAGNOSIS — N184 Chronic kidney disease, stage 4 (severe): Secondary | ICD-10-CM | POA: Diagnosis not present

## 2017-08-04 DIAGNOSIS — J449 Chronic obstructive pulmonary disease, unspecified: Secondary | ICD-10-CM | POA: Diagnosis not present

## 2017-08-04 DIAGNOSIS — I5032 Chronic diastolic (congestive) heart failure: Secondary | ICD-10-CM | POA: Diagnosis not present

## 2017-08-04 DIAGNOSIS — N184 Chronic kidney disease, stage 4 (severe): Secondary | ICD-10-CM | POA: Diagnosis not present

## 2017-08-04 DIAGNOSIS — R1312 Dysphagia, oropharyngeal phase: Secondary | ICD-10-CM | POA: Diagnosis not present

## 2017-08-04 DIAGNOSIS — I2510 Atherosclerotic heart disease of native coronary artery without angina pectoris: Secondary | ICD-10-CM | POA: Diagnosis not present

## 2017-08-04 DIAGNOSIS — I130 Hypertensive heart and chronic kidney disease with heart failure and stage 1 through stage 4 chronic kidney disease, or unspecified chronic kidney disease: Secondary | ICD-10-CM | POA: Diagnosis not present

## 2017-08-05 DIAGNOSIS — I2510 Atherosclerotic heart disease of native coronary artery without angina pectoris: Secondary | ICD-10-CM | POA: Diagnosis not present

## 2017-08-05 DIAGNOSIS — N184 Chronic kidney disease, stage 4 (severe): Secondary | ICD-10-CM | POA: Diagnosis not present

## 2017-08-05 DIAGNOSIS — I5032 Chronic diastolic (congestive) heart failure: Secondary | ICD-10-CM | POA: Diagnosis not present

## 2017-08-05 DIAGNOSIS — I130 Hypertensive heart and chronic kidney disease with heart failure and stage 1 through stage 4 chronic kidney disease, or unspecified chronic kidney disease: Secondary | ICD-10-CM | POA: Diagnosis not present

## 2017-08-05 DIAGNOSIS — R1312 Dysphagia, oropharyngeal phase: Secondary | ICD-10-CM | POA: Diagnosis not present

## 2017-08-05 DIAGNOSIS — J449 Chronic obstructive pulmonary disease, unspecified: Secondary | ICD-10-CM | POA: Diagnosis not present

## 2017-08-06 DIAGNOSIS — J449 Chronic obstructive pulmonary disease, unspecified: Secondary | ICD-10-CM | POA: Diagnosis not present

## 2017-08-06 DIAGNOSIS — I5032 Chronic diastolic (congestive) heart failure: Secondary | ICD-10-CM | POA: Diagnosis not present

## 2017-08-06 DIAGNOSIS — R1312 Dysphagia, oropharyngeal phase: Secondary | ICD-10-CM | POA: Diagnosis not present

## 2017-08-06 DIAGNOSIS — I2510 Atherosclerotic heart disease of native coronary artery without angina pectoris: Secondary | ICD-10-CM | POA: Diagnosis not present

## 2017-08-06 DIAGNOSIS — I130 Hypertensive heart and chronic kidney disease with heart failure and stage 1 through stage 4 chronic kidney disease, or unspecified chronic kidney disease: Secondary | ICD-10-CM | POA: Diagnosis not present

## 2017-08-06 DIAGNOSIS — N184 Chronic kidney disease, stage 4 (severe): Secondary | ICD-10-CM | POA: Diagnosis not present

## 2017-08-08 DIAGNOSIS — I5032 Chronic diastolic (congestive) heart failure: Secondary | ICD-10-CM | POA: Diagnosis not present

## 2017-08-08 DIAGNOSIS — R1312 Dysphagia, oropharyngeal phase: Secondary | ICD-10-CM | POA: Diagnosis not present

## 2017-08-08 DIAGNOSIS — N184 Chronic kidney disease, stage 4 (severe): Secondary | ICD-10-CM | POA: Diagnosis not present

## 2017-08-08 DIAGNOSIS — I130 Hypertensive heart and chronic kidney disease with heart failure and stage 1 through stage 4 chronic kidney disease, or unspecified chronic kidney disease: Secondary | ICD-10-CM | POA: Diagnosis not present

## 2017-08-08 DIAGNOSIS — J449 Chronic obstructive pulmonary disease, unspecified: Secondary | ICD-10-CM | POA: Diagnosis not present

## 2017-08-08 DIAGNOSIS — I2510 Atherosclerotic heart disease of native coronary artery without angina pectoris: Secondary | ICD-10-CM | POA: Diagnosis not present

## 2017-08-10 DIAGNOSIS — R1312 Dysphagia, oropharyngeal phase: Secondary | ICD-10-CM | POA: Diagnosis not present

## 2017-08-10 DIAGNOSIS — I130 Hypertensive heart and chronic kidney disease with heart failure and stage 1 through stage 4 chronic kidney disease, or unspecified chronic kidney disease: Secondary | ICD-10-CM | POA: Diagnosis not present

## 2017-08-10 DIAGNOSIS — N184 Chronic kidney disease, stage 4 (severe): Secondary | ICD-10-CM | POA: Diagnosis not present

## 2017-08-10 DIAGNOSIS — I5032 Chronic diastolic (congestive) heart failure: Secondary | ICD-10-CM | POA: Diagnosis not present

## 2017-08-10 DIAGNOSIS — J449 Chronic obstructive pulmonary disease, unspecified: Secondary | ICD-10-CM | POA: Diagnosis not present

## 2017-08-10 DIAGNOSIS — I2510 Atherosclerotic heart disease of native coronary artery without angina pectoris: Secondary | ICD-10-CM | POA: Diagnosis not present

## 2017-08-11 DIAGNOSIS — J449 Chronic obstructive pulmonary disease, unspecified: Secondary | ICD-10-CM | POA: Diagnosis not present

## 2017-08-11 DIAGNOSIS — N184 Chronic kidney disease, stage 4 (severe): Secondary | ICD-10-CM | POA: Diagnosis not present

## 2017-08-11 DIAGNOSIS — I2510 Atherosclerotic heart disease of native coronary artery without angina pectoris: Secondary | ICD-10-CM | POA: Diagnosis not present

## 2017-08-11 DIAGNOSIS — I130 Hypertensive heart and chronic kidney disease with heart failure and stage 1 through stage 4 chronic kidney disease, or unspecified chronic kidney disease: Secondary | ICD-10-CM | POA: Diagnosis not present

## 2017-08-11 DIAGNOSIS — R1312 Dysphagia, oropharyngeal phase: Secondary | ICD-10-CM | POA: Diagnosis not present

## 2017-08-11 DIAGNOSIS — I5032 Chronic diastolic (congestive) heart failure: Secondary | ICD-10-CM | POA: Diagnosis not present

## 2017-08-12 DIAGNOSIS — Z87440 Personal history of urinary (tract) infections: Secondary | ICD-10-CM | POA: Diagnosis not present

## 2017-08-12 DIAGNOSIS — I130 Hypertensive heart and chronic kidney disease with heart failure and stage 1 through stage 4 chronic kidney disease, or unspecified chronic kidney disease: Secondary | ICD-10-CM | POA: Diagnosis not present

## 2017-08-12 DIAGNOSIS — Z87891 Personal history of nicotine dependence: Secondary | ICD-10-CM | POA: Diagnosis not present

## 2017-08-12 DIAGNOSIS — M353 Polymyalgia rheumatica: Secondary | ICD-10-CM | POA: Diagnosis not present

## 2017-08-12 DIAGNOSIS — J449 Chronic obstructive pulmonary disease, unspecified: Secondary | ICD-10-CM | POA: Diagnosis not present

## 2017-08-12 DIAGNOSIS — K8689 Other specified diseases of pancreas: Secondary | ICD-10-CM | POA: Diagnosis not present

## 2017-08-12 DIAGNOSIS — R1312 Dysphagia, oropharyngeal phase: Secondary | ICD-10-CM | POA: Diagnosis not present

## 2017-08-12 DIAGNOSIS — N184 Chronic kidney disease, stage 4 (severe): Secondary | ICD-10-CM | POA: Diagnosis not present

## 2017-08-12 DIAGNOSIS — I5032 Chronic diastolic (congestive) heart failure: Secondary | ICD-10-CM | POA: Diagnosis not present

## 2017-08-12 DIAGNOSIS — M11251 Other chondrocalcinosis, right hip: Secondary | ICD-10-CM | POA: Diagnosis not present

## 2017-08-12 DIAGNOSIS — Z8701 Personal history of pneumonia (recurrent): Secondary | ICD-10-CM | POA: Diagnosis not present

## 2017-08-12 DIAGNOSIS — I2510 Atherosclerotic heart disease of native coronary artery without angina pectoris: Secondary | ICD-10-CM | POA: Diagnosis not present

## 2017-08-12 DIAGNOSIS — Z955 Presence of coronary angioplasty implant and graft: Secondary | ICD-10-CM | POA: Diagnosis not present

## 2017-08-13 ENCOUNTER — Encounter (HOSPITAL_COMMUNITY): Payer: Self-pay | Admitting: Emergency Medicine

## 2017-08-13 ENCOUNTER — Emergency Department (HOSPITAL_COMMUNITY)

## 2017-08-13 ENCOUNTER — Inpatient Hospital Stay (HOSPITAL_COMMUNITY)
Admission: EM | Admit: 2017-08-13 | Discharge: 2017-09-12 | DRG: 389 | Disposition: E | Attending: Internal Medicine | Admitting: Internal Medicine

## 2017-08-13 ENCOUNTER — Inpatient Hospital Stay (HOSPITAL_COMMUNITY)

## 2017-08-13 DIAGNOSIS — K5900 Constipation, unspecified: Secondary | ICD-10-CM

## 2017-08-13 DIAGNOSIS — I130 Hypertensive heart and chronic kidney disease with heart failure and stage 1 through stage 4 chronic kidney disease, or unspecified chronic kidney disease: Secondary | ICD-10-CM | POA: Diagnosis not present

## 2017-08-13 DIAGNOSIS — K8689 Other specified diseases of pancreas: Secondary | ICD-10-CM | POA: Diagnosis present

## 2017-08-13 DIAGNOSIS — Z66 Do not resuscitate: Secondary | ICD-10-CM | POA: Diagnosis present

## 2017-08-13 DIAGNOSIS — I2510 Atherosclerotic heart disease of native coronary artery without angina pectoris: Secondary | ICD-10-CM | POA: Diagnosis not present

## 2017-08-13 DIAGNOSIS — Z87891 Personal history of nicotine dependence: Secondary | ICD-10-CM

## 2017-08-13 DIAGNOSIS — D631 Anemia in chronic kidney disease: Secondary | ICD-10-CM | POA: Diagnosis present

## 2017-08-13 DIAGNOSIS — I5030 Unspecified diastolic (congestive) heart failure: Secondary | ICD-10-CM | POA: Diagnosis present

## 2017-08-13 DIAGNOSIS — E860 Dehydration: Secondary | ICD-10-CM

## 2017-08-13 DIAGNOSIS — J690 Pneumonitis due to inhalation of food and vomit: Secondary | ICD-10-CM | POA: Diagnosis not present

## 2017-08-13 DIAGNOSIS — D472 Monoclonal gammopathy: Secondary | ICD-10-CM | POA: Diagnosis present

## 2017-08-13 DIAGNOSIS — N390 Urinary tract infection, site not specified: Secondary | ICD-10-CM | POA: Diagnosis present

## 2017-08-13 DIAGNOSIS — R112 Nausea with vomiting, unspecified: Secondary | ICD-10-CM

## 2017-08-13 DIAGNOSIS — I959 Hypotension, unspecified: Secondary | ICD-10-CM | POA: Diagnosis present

## 2017-08-13 DIAGNOSIS — R7989 Other specified abnormal findings of blood chemistry: Secondary | ICD-10-CM | POA: Diagnosis present

## 2017-08-13 DIAGNOSIS — R748 Abnormal levels of other serum enzymes: Secondary | ICD-10-CM | POA: Diagnosis present

## 2017-08-13 DIAGNOSIS — M353 Polymyalgia rheumatica: Secondary | ICD-10-CM | POA: Diagnosis present

## 2017-08-13 DIAGNOSIS — E8809 Other disorders of plasma-protein metabolism, not elsewhere classified: Secondary | ICD-10-CM | POA: Diagnosis present

## 2017-08-13 DIAGNOSIS — K219 Gastro-esophageal reflux disease without esophagitis: Secondary | ICD-10-CM | POA: Diagnosis present

## 2017-08-13 DIAGNOSIS — J410 Simple chronic bronchitis: Secondary | ICD-10-CM | POA: Diagnosis not present

## 2017-08-13 DIAGNOSIS — Z955 Presence of coronary angioplasty implant and graft: Secondary | ICD-10-CM

## 2017-08-13 DIAGNOSIS — Z515 Encounter for palliative care: Secondary | ICD-10-CM

## 2017-08-13 DIAGNOSIS — N184 Chronic kidney disease, stage 4 (severe): Secondary | ICD-10-CM | POA: Diagnosis not present

## 2017-08-13 DIAGNOSIS — F259 Schizoaffective disorder, unspecified: Secondary | ICD-10-CM | POA: Diagnosis present

## 2017-08-13 DIAGNOSIS — B9620 Unspecified Escherichia coli [E. coli] as the cause of diseases classified elsewhere: Secondary | ICD-10-CM | POA: Diagnosis present

## 2017-08-13 DIAGNOSIS — R140 Abdominal distension (gaseous): Secondary | ICD-10-CM

## 2017-08-13 DIAGNOSIS — K5909 Other constipation: Secondary | ICD-10-CM | POA: Diagnosis not present

## 2017-08-13 DIAGNOSIS — R933 Abnormal findings on diagnostic imaging of other parts of digestive tract: Secondary | ICD-10-CM | POA: Diagnosis not present

## 2017-08-13 DIAGNOSIS — Z91048 Other nonmedicinal substance allergy status: Secondary | ICD-10-CM

## 2017-08-13 DIAGNOSIS — R0602 Shortness of breath: Secondary | ICD-10-CM | POA: Diagnosis not present

## 2017-08-13 DIAGNOSIS — I69391 Dysphagia following cerebral infarction: Secondary | ICD-10-CM

## 2017-08-13 DIAGNOSIS — R1110 Vomiting, unspecified: Secondary | ICD-10-CM

## 2017-08-13 DIAGNOSIS — Z8249 Family history of ischemic heart disease and other diseases of the circulatory system: Secondary | ICD-10-CM

## 2017-08-13 DIAGNOSIS — R627 Adult failure to thrive: Secondary | ICD-10-CM | POA: Diagnosis present

## 2017-08-13 DIAGNOSIS — K224 Dyskinesia of esophagus: Secondary | ICD-10-CM | POA: Diagnosis present

## 2017-08-13 DIAGNOSIS — E1122 Type 2 diabetes mellitus with diabetic chronic kidney disease: Secondary | ICD-10-CM | POA: Diagnosis present

## 2017-08-13 DIAGNOSIS — Z7902 Long term (current) use of antithrombotics/antiplatelets: Secondary | ICD-10-CM

## 2017-08-13 DIAGNOSIS — E039 Hypothyroidism, unspecified: Secondary | ICD-10-CM | POA: Diagnosis present

## 2017-08-13 DIAGNOSIS — R0902 Hypoxemia: Secondary | ICD-10-CM

## 2017-08-13 DIAGNOSIS — K567 Ileus, unspecified: Principal | ICD-10-CM

## 2017-08-13 DIAGNOSIS — I700 Atherosclerosis of aorta: Secondary | ICD-10-CM | POA: Diagnosis present

## 2017-08-13 DIAGNOSIS — R109 Unspecified abdominal pain: Secondary | ICD-10-CM

## 2017-08-13 DIAGNOSIS — Z7951 Long term (current) use of inhaled steroids: Secondary | ICD-10-CM

## 2017-08-13 DIAGNOSIS — R1312 Dysphagia, oropharyngeal phase: Secondary | ICD-10-CM | POA: Diagnosis not present

## 2017-08-13 DIAGNOSIS — Z7952 Long term (current) use of systemic steroids: Secondary | ICD-10-CM

## 2017-08-13 DIAGNOSIS — J449 Chronic obstructive pulmonary disease, unspecified: Secondary | ICD-10-CM | POA: Diagnosis present

## 2017-08-13 DIAGNOSIS — Z79890 Hormone replacement therapy (postmenopausal): Secondary | ICD-10-CM

## 2017-08-13 DIAGNOSIS — R8290 Unspecified abnormal findings in urine: Secondary | ICD-10-CM | POA: Diagnosis present

## 2017-08-13 DIAGNOSIS — Z0189 Encounter for other specified special examinations: Secondary | ICD-10-CM

## 2017-08-13 DIAGNOSIS — I5032 Chronic diastolic (congestive) heart failure: Secondary | ICD-10-CM | POA: Diagnosis present

## 2017-08-13 DIAGNOSIS — R079 Chest pain, unspecified: Secondary | ICD-10-CM | POA: Diagnosis not present

## 2017-08-13 DIAGNOSIS — E785 Hyperlipidemia, unspecified: Secondary | ICD-10-CM | POA: Diagnosis present

## 2017-08-13 DIAGNOSIS — Z881 Allergy status to other antibiotic agents status: Secondary | ICD-10-CM

## 2017-08-13 DIAGNOSIS — Z7189 Other specified counseling: Secondary | ICD-10-CM | POA: Diagnosis not present

## 2017-08-13 DIAGNOSIS — Z4682 Encounter for fitting and adjustment of non-vascular catheter: Secondary | ICD-10-CM | POA: Diagnosis not present

## 2017-08-13 DIAGNOSIS — R05 Cough: Secondary | ICD-10-CM | POA: Diagnosis not present

## 2017-08-13 DIAGNOSIS — Z8701 Personal history of pneumonia (recurrent): Secondary | ICD-10-CM

## 2017-08-13 DIAGNOSIS — R1310 Dysphagia, unspecified: Secondary | ICD-10-CM | POA: Diagnosis present

## 2017-08-13 DIAGNOSIS — R1111 Vomiting without nausea: Secondary | ICD-10-CM | POA: Diagnosis not present

## 2017-08-13 DIAGNOSIS — R778 Other specified abnormalities of plasma proteins: Secondary | ICD-10-CM | POA: Diagnosis present

## 2017-08-13 DIAGNOSIS — Z79891 Long term (current) use of opiate analgesic: Secondary | ICD-10-CM

## 2017-08-13 LAB — URINALYSIS, ROUTINE W REFLEX MICROSCOPIC
Bilirubin Urine: NEGATIVE
Glucose, UA: NEGATIVE mg/dL
Hgb urine dipstick: NEGATIVE
KETONES UR: NEGATIVE mg/dL
NITRITE: NEGATIVE
PH: 5 (ref 5.0–8.0)
Protein, ur: 30 mg/dL — AB
Specific Gravity, Urine: 1.023 (ref 1.005–1.030)

## 2017-08-13 LAB — CBC WITH DIFFERENTIAL/PLATELET
BASOS PCT: 0 %
Basophils Absolute: 0 10*3/uL (ref 0.0–0.1)
EOS ABS: 0 10*3/uL (ref 0.0–0.7)
EOS PCT: 0 %
HCT: 24.5 % — ABNORMAL LOW (ref 36.0–46.0)
Hemoglobin: 8.1 g/dL — ABNORMAL LOW (ref 12.0–15.0)
Lymphocytes Relative: 5 %
Lymphs Abs: 0.7 10*3/uL (ref 0.7–4.0)
MCH: 30.9 pg (ref 26.0–34.0)
MCHC: 33.1 g/dL (ref 30.0–36.0)
MCV: 93.5 fL (ref 78.0–100.0)
MONO ABS: 0.3 10*3/uL (ref 0.1–1.0)
MONOS PCT: 2 %
Neutro Abs: 13.6 10*3/uL — ABNORMAL HIGH (ref 1.7–7.7)
Neutrophils Relative %: 93 %
Platelets: 150 10*3/uL (ref 150–400)
RBC: 2.62 MIL/uL — ABNORMAL LOW (ref 3.87–5.11)
RDW: 15.5 % (ref 11.5–15.5)
WBC: 14.6 10*3/uL — ABNORMAL HIGH (ref 4.0–10.5)

## 2017-08-13 LAB — CBC
HCT: 34.8 % — ABNORMAL LOW (ref 36.0–46.0)
HEMOGLOBIN: 11.3 g/dL — AB (ref 12.0–15.0)
MCH: 30.4 pg (ref 26.0–34.0)
MCHC: 32.5 g/dL (ref 30.0–36.0)
MCV: 93.5 fL (ref 78.0–100.0)
Platelets: 299 10*3/uL (ref 150–400)
RBC: 3.72 MIL/uL — AB (ref 3.87–5.11)
RDW: 15.3 % (ref 11.5–15.5)
WBC: 21.2 10*3/uL — AB (ref 4.0–10.5)

## 2017-08-13 LAB — BASIC METABOLIC PANEL
ANION GAP: 16 — AB (ref 5–15)
BUN: 26 mg/dL — ABNORMAL HIGH (ref 6–20)
CALCIUM: 9.7 mg/dL (ref 8.9–10.3)
CO2: 21 mmol/L — AB (ref 22–32)
Chloride: 103 mmol/L (ref 101–111)
Creatinine, Ser: 1.75 mg/dL — ABNORMAL HIGH (ref 0.44–1.00)
GFR calc non Af Amer: 27 mL/min — ABNORMAL LOW (ref >60)
GFR, EST AFRICAN AMERICAN: 31 mL/min — AB (ref >60)
Glucose, Bld: 118 mg/dL — ABNORMAL HIGH (ref 65–99)
Potassium: 3.9 mmol/L (ref 3.5–5.1)
SODIUM: 140 mmol/L (ref 135–145)

## 2017-08-13 LAB — I-STAT CG4 LACTIC ACID, ED
LACTIC ACID, VENOUS: 2.39 mmol/L — AB (ref 0.5–1.9)
Lactic Acid, Venous: 0.92 mmol/L (ref 0.5–1.9)

## 2017-08-13 LAB — I-STAT TROPONIN, ED: TROPONIN I, POC: 0.19 ng/mL — AB (ref 0.00–0.08)

## 2017-08-13 LAB — HEPATIC FUNCTION PANEL
ALBUMIN: 2.4 g/dL — AB (ref 3.5–5.0)
ALK PHOS: 55 U/L (ref 38–126)
ALT: 11 U/L — ABNORMAL LOW (ref 14–54)
AST: 23 U/L (ref 15–41)
BILIRUBIN DIRECT: 0.1 mg/dL (ref 0.1–0.5)
BILIRUBIN INDIRECT: 0.7 mg/dL (ref 0.3–0.9)
BILIRUBIN TOTAL: 0.8 mg/dL (ref 0.3–1.2)
Total Protein: 5.1 g/dL — ABNORMAL LOW (ref 6.5–8.1)

## 2017-08-13 LAB — PROTIME-INR
INR: 0.91
PROTHROMBIN TIME: 12.2 s (ref 11.4–15.2)

## 2017-08-13 LAB — LIPASE, BLOOD: LIPASE: 34 U/L (ref 11–51)

## 2017-08-13 MED ORDER — SODIUM CHLORIDE 0.9% FLUSH
3.0000 mL | Freq: Two times a day (BID) | INTRAVENOUS | Status: DC
  Administered 2017-08-13 – 2017-08-19 (×12): 3 mL via INTRAVENOUS

## 2017-08-13 MED ORDER — SODIUM CHLORIDE 0.9 % IV SOLN
INTRAVENOUS | Status: DC
  Administered 2017-08-13 – 2017-08-14 (×2): via INTRAVENOUS

## 2017-08-13 MED ORDER — MORPHINE SULFATE (CONCENTRATE) 10 MG/0.5ML PO SOLN
5.0000 mg | ORAL | Status: DC | PRN
  Administered 2017-08-14 – 2017-08-19 (×8): 5 mg via ORAL
  Filled 2017-08-13 (×8): qty 0.5

## 2017-08-13 MED ORDER — POLYETHYLENE GLYCOL 3350 17 GM/SCOOP PO POWD: 17.0000 g | Freq: Two times a day (BID) | ORAL | Status: DC

## 2017-08-13 MED ORDER — VANCOMYCIN HCL IN DEXTROSE 750-5 MG/150ML-% IV SOLN: 750.0000 mg | INTRAVENOUS | Status: DC

## 2017-08-13 MED ORDER — FLUTICASONE PROPIONATE 50 MCG/ACT NA SUSP
2.0000 | Freq: Every day | NASAL | Status: DC | PRN
  Administered 2017-08-17 – 2017-08-19 (×2): 2 via NASAL
  Filled 2017-08-13: qty 16

## 2017-08-13 MED ORDER — ONDANSETRON HCL 4 MG PO TABS: 4.0000 mg | ORAL_TABLET | Freq: Four times a day (QID) | ORAL | Status: DC | PRN

## 2017-08-13 MED ORDER — SODIUM CHLORIDE 0.9 % IV SOLN
1.0000 g | INTRAVENOUS | Status: DC
  Administered 2017-08-13 – 2017-08-14 (×2): 1 g via INTRAVENOUS
  Filled 2017-08-13 (×3): qty 10

## 2017-08-13 MED ORDER — MAGNESIUM HYDROXIDE 400 MG/5ML PO SUSP
30.0000 mL | Freq: Once | ORAL | Status: AC
  Administered 2017-08-13: 30 mL via ORAL
  Filled 2017-08-13: qty 30

## 2017-08-13 MED ORDER — PIPERACILLIN-TAZOBACTAM 3.375 G IVPB 30 MIN
3.3750 g | Freq: Once | INTRAVENOUS | Status: AC
  Administered 2017-08-13: 3.375 g via INTRAVENOUS
  Filled 2017-08-13: qty 50

## 2017-08-13 MED ORDER — SENNOSIDES-DOCUSATE SODIUM 8.6-50 MG PO TABS: 2.0000 | ORAL_TABLET | Freq: Every day | ORAL | Status: DC

## 2017-08-13 MED ORDER — TIOTROPIUM BROMIDE MONOHYDRATE 2.5 MCG/ACT IN AERS: 2.0000 | INHALATION_SPRAY | Freq: Every day | RESPIRATORY_TRACT | Status: DC

## 2017-08-13 MED ORDER — LEVOTHYROXINE SODIUM 100 MCG IV SOLR
37.5000 ug | Freq: Every day | INTRAVENOUS | Status: DC
  Administered 2017-08-14 – 2017-08-20 (×7): 37.5 ug via INTRAVENOUS
  Filled 2017-08-13 (×8): qty 5

## 2017-08-13 MED ORDER — SODIUM CHLORIDE 0.9 % IV BOLUS (SEPSIS)
500.0000 mL | Freq: Once | INTRAVENOUS | Status: AC
  Administered 2017-08-13: 500 mL via INTRAVENOUS

## 2017-08-13 MED ORDER — CLOZAPINE 100 MG PO TABS
300.0000 mg | ORAL_TABLET | Freq: Once | ORAL | Status: AC
  Administered 2017-08-14: 300 mg via ORAL
  Filled 2017-08-13: qty 3

## 2017-08-13 MED ORDER — ENOXAPARIN SODIUM 30 MG/0.3ML ~~LOC~~ SOLN
30.0000 mg | SUBCUTANEOUS | Status: DC
  Administered 2017-08-13 – 2017-08-17 (×5): 30 mg via SUBCUTANEOUS
  Filled 2017-08-13 (×5): qty 0.3

## 2017-08-13 MED ORDER — SODIUM CHLORIDE 0.9 % IV BOLUS (SEPSIS)
1000.0000 mL | Freq: Once | INTRAVENOUS | Status: AC
Start: 2017-08-13 — End: 2017-08-13
  Administered 2017-08-13: 1000 mL via INTRAVENOUS

## 2017-08-13 MED ORDER — MOMETASONE FURO-FORMOTEROL FUM 200-5 MCG/ACT IN AERO
2.0000 | INHALATION_SPRAY | Freq: Two times a day (BID) | RESPIRATORY_TRACT | Status: DC
  Administered 2017-08-13 – 2017-08-20 (×9): 2 via RESPIRATORY_TRACT
  Filled 2017-08-13: qty 8.8

## 2017-08-13 MED ORDER — METHYLPREDNISOLONE SODIUM SUCC 40 MG IJ SOLR
40.0000 mg | Freq: Every day | INTRAMUSCULAR | Status: DC
  Administered 2017-08-13 – 2017-08-17 (×5): 40 mg via INTRAVENOUS
  Filled 2017-08-13 (×5): qty 1

## 2017-08-13 MED ORDER — IPRATROPIUM-ALBUTEROL 0.5-2.5 (3) MG/3ML IN SOLN
3.0000 mL | Freq: Three times a day (TID) | RESPIRATORY_TRACT | Status: DC
  Administered 2017-08-13 – 2017-08-14 (×4): 3 mL via RESPIRATORY_TRACT
  Filled 2017-08-13 (×4): qty 3

## 2017-08-13 MED ORDER — POLYETHYLENE GLYCOL 3350 17 G PO PACK: 17.0000 g | PACK | Freq: Two times a day (BID) | ORAL | Status: DC

## 2017-08-13 MED ORDER — RESOURCE THICKENUP CLEAR PO POWD
ORAL | Status: DC | PRN
  Filled 2017-08-13: qty 125

## 2017-08-13 MED ORDER — PANTOPRAZOLE SODIUM 40 MG IV SOLR
40.0000 mg | INTRAVENOUS | Status: DC
  Administered 2017-08-13 – 2017-08-16 (×4): 40 mg via INTRAVENOUS
  Filled 2017-08-13 (×4): qty 40

## 2017-08-13 MED ORDER — VANCOMYCIN HCL IN DEXTROSE 1-5 GM/200ML-% IV SOLN
1000.0000 mg | Freq: Once | INTRAVENOUS | Status: AC
  Administered 2017-08-13: 1000 mg via INTRAVENOUS
  Filled 2017-08-13: qty 200

## 2017-08-13 MED ORDER — ACETAMINOPHEN 325 MG PO TABS
650.0000 mg | ORAL_TABLET | Freq: Four times a day (QID) | ORAL | Status: DC | PRN
  Administered 2017-08-13 – 2017-08-20 (×12): 650 mg via ORAL
  Filled 2017-08-13 (×13): qty 2

## 2017-08-13 MED ORDER — IOPAMIDOL (ISOVUE-300) INJECTION 61%
INTRAVENOUS | Status: AC
  Filled 2017-08-13: qty 30

## 2017-08-13 MED ORDER — PIPERACILLIN-TAZOBACTAM 3.375 G IVPB: 3.3750 g | Freq: Three times a day (TID) | INTRAVENOUS | Status: DC

## 2017-08-13 MED ORDER — ONDANSETRON HCL 4 MG/2ML IJ SOLN
4.0000 mg | Freq: Four times a day (QID) | INTRAMUSCULAR | Status: DC | PRN
  Administered 2017-08-19: 4 mg via INTRAVENOUS
  Filled 2017-08-13: qty 2

## 2017-08-13 MED ORDER — ACETAMINOPHEN 650 MG RE SUPP: 650.0000 mg | Freq: Four times a day (QID) | RECTAL | Status: DC | PRN

## 2017-08-13 MED ORDER — TIOTROPIUM BROMIDE MONOHYDRATE 18 MCG IN CAPS
18.0000 ug | ORAL_CAPSULE | Freq: Every day | RESPIRATORY_TRACT | Status: DC
  Administered 2017-08-14 – 2017-08-20 (×6): 18 ug via RESPIRATORY_TRACT
  Filled 2017-08-13 (×2): qty 5

## 2017-08-13 NOTE — Progress Notes (Signed)
Pharmacy Antibiotic Note  Rebecca [name] is a 77 y.o. female admitted on 09/04/2017 with sepsis.  Pharmacy has been consulted for vancomycin and zosyn dosing.  Given one time doses in the ED. SCr 1.75, CrCl ~8ml/min.  Plan: Start Zosyn 3.375 gm IV q8h (4 hour infusion) Start vancomycin 750mg  IV Q24h tomorrow Monitor clinical picture, renal function, VT prn F/U C&S, abx deescalation / LOT   Height: 5\' 3"  (160 cm) Weight: 140 lb (63.5 kg) IBW/kg (Calculated) : 52.4  Temp (24hrs), Avg:97.2 F (36.2 C), Min:97.2 F (36.2 C), Max:97.2 F (36.2 C)  Recent Labs  Lab 08/24/2017 0716  LATICACIDVEN 2.39*    CrCl cannot be calculated (Patient's most recent lab result is older than the maximum 21 days allowed.).    Allergies  Allergen Reactions  . Levaquin [Levofloxacin] Other (See Comments)    Ruptured tendon  . Tape Other (See Comments)    PATIENT'S SKIN IS VERY, VERY THIN AND WILL TEAR AND BRUISE EASILY; PLEASE USE AN ALTERNATIVE (SOMETHING OTHER THAN TAPE)    Thank you for allowing pharmacy to be a part of this patient's care.  Rebecca [name] 08/19/2017 7:27 AM

## 2017-08-13 NOTE — Progress Notes (Signed)
Per son, pt is a high risk for aspiration pneumonia and has required thickened liquids in the past. MD text paged and waiting for response as pt has to drink oral contrast before her Ct scan tonight

## 2017-08-13 NOTE — H&P (Addendum)
History and Physical    Rebecca [name]:[id number] DOB: Oct 09, 1940 DOA: 08/19/2017  **Will admit patient based on the expectation that the patient will need hospitalization/ hospital care that crosses at least 2 midnights  PCP: [name], MD   Attending physician: Aggie Moats  Patient coming from/Resides with: Private residence/daughter  Chief Complaint: Nausea and vomiting chest discomfort  HPI: Rebecca [name] is a 77 y.o. female with medical history significant for multiple medical problems including but not limited to: CAD with DES to RCA in 2006 and last catheterization in 2017, MGUS, PMR, COPD, chronic steroids, dyslipidemia, chronic diastolic heart failure, history of diabetes no longer medications, stage IV chronic kidney disease, anemia of chronic kidney disease, schizoaffective disorder, history of stroke, history of GERD, and hypothyroidism.  Patient also has significant failure to thrive and post stroke dysphagia and episodic recurrent aspiration pneumonia.  She was last hospitalized in November 2018 again due to aspiration pneumonia and possible recurrent UTI.  At that time goals of care were discussed and DNR form and most form were completed.  Patient was subsequently discharged with home health hospice primarily for cardiac issues.  Patient was brought to the ER today because of recurrent nausea and vomiting of brown emesis with chest discomfort that radiated to the upper back.  Upon EMS arrival to the home patient was found to be hypotensive with a blood pressure of 87/54 and heart rate of 130s.  Patient reports she was aware of these palpitations.  In the ER patient appeared to be dehydrated with mild elevation in BUN from baseline.  She had a leukocytosis with a white count of 21,200 and she will not obtained and an associated abnormal urinalysis.  Patient's poc troponin was elevated at 0.19 in the context of recent hypotension and elevated lactic acid.  Patient has been given  fluid challenges with normalization of lactic acid.  Because of elevated white count, hypotension and abnormal labs as described patient was initially treated as sepsis and given broad-spectrum antibiotics with vancomycin and Zosyn.  Chest x-ray was unremarkable.  Abdominal films revealed small bowel ileus in the context of distal transverse colon obstipation.  Patient's hospice nurse directed family to call EMS and presented to ER.  ED Course:  Vital Signs: BP (!) 102/44 (BP Location: Right Arm)   Pulse (!) 102   Temp 99 F (37.2 C) (Rectal)   Resp 16   Ht 5' 3"  (1.6 m)   Wt 63.5 kg (140 lb)   SpO2 96%   BMI 24.80 kg/m  Chest x-ray and abdominal films: As above Lab data: Sodium 140, potassium 3.9 chloride 103, CO2 21, glucose 118, BUN 26, creatinine 1.75, anion gap 16, albumin 2.4, LFTs not elevated, poc troponin 0.19, lactic acid 2.39 with follow-up 0.92, white count 21,200 differential not obtained, hemoglobin 11.1, platelets 199,000.  Urinalysis abnormal and concerning for possible UTI noting amber color, large leukocytes, nitrite negative, 30 protein, few bacteria and WBCs TNTC, there was also the presence of mucus and hyaline casts.  Blood cultures obtained in the ER. Medications and treatments: Zosyn 3.375 g IV x1, vancomycin 1 g IV x1, normal saline bolus times 1500 cc  Review of Systems:  In addition to the HPI above,  No Fever-chills, myalgias or other constitutional symptoms No Headache, changes with Vision or hearing, new weakness, tingling, numbness in any extremity, dizziness, dysarthria or word finding difficulty, gait disturbance or imbalance, tremors or seizure activity No abdominal pain with or after eating No  Cough or Shortness of Breath, palpitations, orthopnea or DOE No melena,hematochezia, dark tarry stools No dysuria, malodorous urine, hematuria or flank pain No new skin rashes, lesions, masses or bruises, No new joint pains, aches, swelling or redness No recent  unintentional weight gain No polyuria, polydypsia or polyphagia   Past Medical History:  Diagnosis Date  . AKI (acute kidney injury) ([place])   . Anemia in chronic renal disease   . Aortic atherosclerosis ([place]) 01/27/2017  . CAD (coronary artery disease) 2006   a. s/p DES to RCA in 2006 b. low-risk NST in 2014 c. cath in 08/2015 showing patent RCA stent with nonobstructive disease  . CKD (chronic kidney disease) stage 4, GFR 15-29 ml/min ([facility])   . Complication of anesthesia   . COPD (chronic obstructive pulmonary disease) ([place])   . Diabetes mellitus   . Diastolic heart failure, NYHA class 1 ([place])   . Dyslipidemia   . Dysuria 01/06/2015  . Frozen shoulder    right  . Gastroesophageal reflux   . HAP (hospital-acquired pneumonia)   . Headache   . Hyperkalemia 01/01/2016  . Hypothyroidism   . 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 Obesity   . Pancreatic mass 01/27/2017  . Pneumonia 06/2016  . Polymyalgia rheumatica ([place])   . PONV (postoperative nausea and vomiting)   . Renal insufficiency   . Respiratory distress   . Schizoaffective disorder ([place])   . Sepsis due to pneumonia ([place])   . Shortness of breath   . Stroke [facility])     Past Surgical History:  Procedure Laterality Date  . ABDOMINAL HYSTERECTOMY    . CARDIAC CATHETERIZATION N/A 08/13/2015   Procedure: Left Heart Cath and Coronary Angiography;  Surgeon: Jettie Booze, MD;  Location: [facility];  Service: Cardiovascular;  Laterality: N/A;  . CATARACT EXTRACTION    . CHOLECYSTECTOMY    . CORONARY ANGIOPLASTY WITH STENT PLACEMENT  2006   [place] DES to RCA, 50 % residual  . PARTIAL HYSTERECTOMY  1995    Social History   Socioeconomic History  . Marital status: Divorced    Spouse name: Not on file  . Number of children: 3  . Years of education: 9th  . Highest education level: Not on file  Social Needs  . Financial resource strain: Not on file  . Food insecurity - worry: Not on file   . Food insecurity - inability: Not on file  . Transportation needs - medical: Not on file  . Transportation needs - non-medical: Not on file  Occupational History  . Occupation: homemaker  Tobacco Use  . Smoking status: Former Smoker    Packs/day: 1.00    Years: 35.00    Pack years: 35.00    Last attempt to quit: 04/14/2004    Years since quitting: 13.3  . Smokeless tobacco: Never Used  Substance and Sexual Activity  . Alcohol use: No  . Drug use: No  . Sexual activity: No  Other Topics Concern  . Not on file  Social History Narrative   Daughter [name] and son are very involved.     [name] comes to most office visits.   She now lives at home with daughter [name]   Daughter is HCPOA, not averse to SNF/rehab in short-term, but does not wish pt to return to [facility] and requests that pt/pt's care not be discussed by [facility] providers to [facility], moving forward.   Caffeine Use: 1-3 cups daily  Mobility: Bedbound Work history: Not obtained   Allergies  Allergen Reactions  . Levaquin [Levofloxacin] Other (See Comments)    Ruptured tendon  . Tape Other (See Comments)    PATIENT'S SKIN IS VERY, VERY THIN AND WILL TEAR AND BRUISE EASILY; PLEASE USE AN ALTERNATIVE (SOMETHING OTHER THAN TAPE)    Family History  Problem Relation Age of Onset  . Diabetes Mother   . Heart disease Mother   . Heart disease Father   . Arthritis Father   . Healthy Sister   . Heart disease Brother   . Cancer Brother        colon ca  . Osteoporosis Sister   . Heart disease Brother   . Diabetes Brother   . Heart disease Brother   . Diabetes Brother   . Diabetes Brother   . Healthy Brother   . Healthy Brother   . Healthy Brother   . Cancer Maternal Uncle        blood condition  . Cancer Daughter        breast ca  . Cancer Cousin        NHL    Prior to Admission medications   Medication Sig Start Date End Date Taking? Authorizing Provider  acetaminophen (TYLENOL) 500 MG  tablet Take 1 tablet (500 mg total) by mouth every 6 (six) hours as needed (pain). Patient taking differently: Take 1,000 mg 2 (two) times daily as needed by mouth (pain).  11/21/16  Yes Arrien, Jimmy Picket, MD  bisacodyl (DULCOLAX) 10 MG suppository Place 1 suppository (10 mg total) rectally daily as needed for moderate constipation. 05/11/17  Yes Aline August, MD  cetirizine (ZYRTEC) 10 MG tablet Take 10 mg by mouth at bedtime.    Yes [provider]  chlorhexidine (PERIDEX) 0.12 % solution 15 mLs by Mouth Rinse route 2 (two) times daily. 05/11/17  Yes Aline August, MD  clopidogrel (PLAVIX) 75 MG tablet Take 1 tablet (75 mg total) by mouth daily with breakfast. Patient taking differently: Take 75 mg by mouth at bedtime.  10/18/13  Yes Thurnell Lose, MD  clotrimazole (LOTRIMIN) 1 % cream Apply topically 2 (two) times daily. Apply to under left breast. May also apply under right breast as needed 05/11/17  Yes Aline August, MD  cloZAPine (CLOZARIL) 100 MG tablet Take 300 mg by mouth at bedtime. 11/04/16  Yes [provider]  colchicine 0.6 MG tablet Take 0.6 mg by mouth See admin instructions. 5 days on then 3 days off   Yes [provider]  Cranberry (SM CRANBERRY) 300 MG tablet Take 300 mg daily with breakfast by mouth.    Yes [provider]  diltiazem (CARDIZEM CD) 240 MG 24 hr capsule Take 240 mg by mouth every morning.   Yes [provider]  fluticasone (FLONASE) 50 MCG/ACT nasal spray Place 2 sprays daily as needed into both nostrils (seasonal allergies).  03/04/17  Yes [provider]  Fluticasone-Salmeterol (ADVAIR) 250-50 MCG/DOSE AEPB Inhale 1 puff into the lungs at bedtime.   Yes [provider]  furosemide (LASIX) 20 MG tablet Take 20 mg daily as needed by mouth (weight gain of 3 lbs in 24 hours).   Yes [provider]  Guaifenesin 1200 MG TB12 Take 1,200 mg by mouth daily. 06/09/17  Yes [provider]  ipratropium-albuterol (DUONEB) 0.5-2.5 (3) MG/3ML SOLN Inhale 3 mLs into the lungs 3 (three) times daily.  11/05/16  Yes [provider]  levothyroxine (SYNTHROID, [place])  75 MCG tablet Take 75 mcg by mouth daily before breakfast.   Yes [provider]  Liniments (Nowthen) Place 1 patch See admin instructions onto the skin. Apply one patch to lower back and one patch to hip every morning as needed for pain, remove after 8 hours   Yes [provider]  Morphine Sulfate (MORPHINE CONCENTRATE) 10 MG/0.5ML SOLN concentrated solution Take 0.25 mLs (5 mg total) by mouth every 3 (three) hours as needed for severe pain or shortness of breath (air hunger). 05/11/17  Yes Aline August, MD  Multiple Vitamin (MULTIVITAMIN WITH MINERALS) TABS tablet Take 1 tablet by mouth every 3 (three) days.    Yes [provider]  nitroGLYCERIN (NITROSTAT) 0.4 MG SL tablet Place 1 tablet (0.4 mg total) under the tongue every 5 (five) minutes as needed. For chest pain Patient taking differently: Place 0.4 mg under the tongue every 5 (five) minutes as needed for chest pain.  01/11/17  Yes Strader, [place] M, PA-C  ondansetron (ZOFRAN) 4 MG tablet Take 1 tablet (4 mg total) by mouth every 8 (eight) hours as needed for nausea or vomiting. 05/11/17  Yes Aline August, MD  pantoprazole (PROTONIX) 40 MG tablet Take 1 tablet (40 mg total) by mouth daily. Patient taking differently: Take 40 mg daily with breakfast by mouth.  04/03/13  Yes Hilty, Nadean Corwin, MD  polyethylene glycol powder (GLYCOLAX/MIRALAX) powder Take 17 g by mouth 2 (two) times daily. [place] Patient taking differently: Take 17 g 2 (two) times daily by mouth. Mix in 8 oz liquid and drink 01/28/17  Yes Samuella Cota, MD  pravastatin (PRAVACHOL) 20 MG tablet Take 20 mg by mouth at bedtime.   Yes [provider]  predniSONE (DELTASONE) 10 MG tablet Take 1 tablet (10 mg total) by mouth daily  with breakfast. 10/11/16  Yes Charlynne Cousins, MD  Probiotic Product (PROBIOTIC PO) Take 1 capsule daily with breakfast by mouth.    Yes [provider]  senna-docusate (SENNA-S) 8.6-50 MG tablet Take 2 tablets at bedtime by mouth.   Yes [provider]  tamsulosin (FLOMAX) 0.4 MG CAPS capsule Take 1 capsule (0.4 mg total) by mouth daily. Patient taking differently: Take 0.4 mg daily with breakfast by mouth.  03/27/17  Yes Thurnell Lose, MD  Tiotropium Bromide Monohydrate (SPIRIVA RESPIMAT) 2.5 MCG/ACT AERS Inhale 2 puffs daily into the lungs.    Yes [provider]  Vitamin D, Ergocalciferol, (DRISDOL) 50000 UNITS CAPS capsule Take 1 capsule (50,000 Units total) by mouth every 7 (seven) days. Patient taking differently: Take 50,000 Units by mouth every Wednesday.  08/27/13  Yes Kinnie Feil, MD  hydrocortisone (ANUSOL-HC) 2.5 % rectal cream Place rectally 2 (two) times daily. Patient not taking: Reported on 09/03/2017 05/11/17   Aline August, MD  isosorbide mononitrate (IMDUR) 60 MG 24 hr tablet Take 1 tablet (60 mg total) by mouth daily. Patient not taking: Reported on 09/05/2017 03/14/13   Isaiah Serge, NP    Physical Exam: Vitals:   08/25/2017 0817 08/24/2017 0845 08/25/2017 0915 09/03/2017 1030  BP:  (!) 109/51 (!) 109/49 (!) 102/44  Pulse:  (!) 109 (!) 107 (!) 102  Resp:  18 17 16   Temp: 99 F (37.2 C)     TempSrc: Rectal     SpO2:  94% 93% 96%  Weight:      Height:          Constitutional: NAD, calm, comfortable  Eyes: PERRL, lids and conjunctivae normal ENMT: Mucous membranes are dry. Posterior pharynx clear of any exudate or lesions.age-appropriate dentition.  Neck: normal, supple, no masses, no thyromegaly Respiratory: clear to auscultation bilaterally, no wheezing, no crackles. Normal respiratory effort. No accessory muscle use.  Cardiovascular: Regular rate and rhythm, no murmurs / rubs / gallops. No extremity edema. 2+ pedal pulses. No  carotid bruits.  Abdomen: Subtly tender left lateral/mid quadrant without guarding or rebounding, abdomen is distended and soft with hypoactive bowel sounds, no masses palpated. No hepatosplenomegaly.  Musculoskeletal: no clubbing / cyanosis. No joint deformity upper and lower extremities. Good ROM, no contractures. Normal muscle tone.  Skin: no rashes, lesions, ulcers. No induration-multiple areas of ecchymosis in various stages of maturity in the arms and legs-daughter reports as chronic and that patient easily bleeds after mild trauma to extremities. Neurologic: CN 2-12 grossly intact. Sensation intact, DTR normal. Strength 5/5 x all 4 extremities.  Psychiatric:  Alert and oriented x name and place. Normal mood.  Obvious short-term memory deficits appreciated   Labs on Admission: I have personally reviewed following labs and imaging studies  CBC: Recent Labs  Lab 08/18/2017 0704  WBC 21.2*  HGB 11.3*  HCT 34.8*  MCV 93.5  PLT 009   Basic Metabolic Panel: Recent Labs  Lab 08/29/2017 0704  NA 140  K 3.9  CL 103  CO2 21*  GLUCOSE 118*  BUN 26*  CREATININE 1.75*  CALCIUM 9.7   GFR: Estimated Creatinine Clearance: 23.8 mL/min (A) (by C-G formula based on SCr of 1.75 mg/dL (H)). Liver Function Tests: Recent Labs  Lab 08/25/2017 0704  AST 23  ALT 11*  ALKPHOS 55  BILITOT 0.8  PROT 5.1*  ALBUMIN 2.4*   Recent Labs  Lab 09/08/2017 0704  LIPASE 34   No results for input(s): AMMONIA in the last 168 hours. Coagulation Profile: Recent Labs  Lab 09/03/2017 0704  INR 0.91   Cardiac Enzymes: No results for input(s): CKTOTAL, [name], [name], TROPONINI in the last 168 hours. BNP (last 3 results) Recent Labs    03/29/17 1646  PROBNP 84.0   HbA1C: No results for input(s): HGBA1C in the last 72 hours. CBG: No results for input(s): GLUCAP in the last 168 hours. Lipid Profile: No results for input(s): CHOL, [name], [name], [name], [name], LDLDIRECT in the last 72  hours. Thyroid Function Tests: No results for input(s): TSH, T4TOTAL, FREET4, T3FREE, THYROIDAB in the last 72 hours. Anemia Panel: No results for input(s): VITAMINB12, FOLATE, FERRITIN, TIBC, IRON, RETICCTPCT in the last 72 hours. Urine analysis:    Component Value Date/Time   COLORURINE AMBER (A) 08/25/2017 1048   APPEARANCEUR HAZY (A) 08/28/2017 1048   LABSPEC 1.023 08/22/2017 1048   LABSPEC 1.005 03/07/2017 1549   PHURINE 5.0 08/12/2017 1048   GLUCOSEU NEGATIVE 08/24/2017 1048   GLUCOSEU Negative 03/07/2017 1549   HGBUR NEGATIVE 09/11/2017 1048   HGBUR negative 12/03/2008 1113   BILIRUBINUR NEGATIVE 08/22/2017 1048   BILIRUBINUR Negative 03/07/2017 1549   KETONESUR NEGATIVE 08/16/2017 1048   PROTEINUR 30 (A) 08/16/2017 1048   UROBILINOGEN 0.2 03/07/2017 1549   NITRITE NEGATIVE 08/31/2017 1048   LEUKOCYTESUR LARGE (A) 08/12/2017 1048   LEUKOCYTESUR Small 03/07/2017 1549   Sepsis Labs: @LABRCNTIP (procalcitonin:4,lacticidven:4) )No results found for this or any previous visit (from the past 240 hour(s)).   Radiological Exams on Admission: Dg Chest 2 View  Result Date: 09/09/2017 CLINICAL DATA:  Chest pain EXAM: CHEST  2 VIEW COMPARISON:  May 07, 2007 2  FINDINGS: The cardiomediastinal silhouette is stable. No pneumothorax. The right lung is clear. Mild opacity seen in the left base with blunting of left costophrenic angle. There is opacity in this region on the November 2018 studies. IMPRESSION: 1. There is opacity in the left base which is improved since November 2018. Whether this recommends scarring from a previous infiltrate or redevelopment of a new infiltrate is unclear. Recommend attention on follow-up. No other acute abnormalities. Electronically Signed   By: Dorise Bullion III M.D   On: 09/11/2017 07:39   Dg Abd 2 Views  Result Date: 09/06/2017 CLINICAL DATA:  Vomiting EXAM: ABDOMEN - 2 VIEW COMPARISON:  April 27, 2017 FINDINGS: Moderate fecal loading in the  colon. The distal transverse colon is distended up to 8.7 cm. There is gas extending to the level of the rectum. Air-filled prominent loops of small bowel are seen in the left side of the abdomen measuring up to 4 cm. No free air, portal venous gas, or pneumatosis. No other acute abnormalities. IMPRESSION: 1. Mildly dilated loops of small bowel are seen in the left abdomen. Air-filled dilated transverse colon is seen with gas extending to the rectum. The findings could represent ileus or early obstruction. Recommend clinical correlation and follow-up as clinically warranted. Electronically Signed   By: Dorise Bullion III M.D   On: 08/18/2017 07:41    EKG: (Independently reviewed) sinus tachycardia with ventricular rate 130 bpm, QTC 440 ms, normal R wave rotation, evidence of early repolarization especially in lateral leads, voltage criteria met for LVH.  Assessment/Plan Principal Problem:   Ileus 2/2 Acute on chronic Constipation -Patient presents with nausea and vomiting of brown colored emesis with associated chest discomfort radiating to back with radiographic evidence of ileus and obstipation at the distal transverse colon. -No BM x 3 days- Dulcolax supp Friday w/ no results -Allow for sips of clear liquids -Give one-time dose milk of magnesia and monitor for response-may need to be more aggressive with laxatives such as initiate magnesium citrate -Attempt to resume home MiraLAX and Senokot in a.m. if no further emesis -IV Zofran prn nausea -X-ray without definitive bowel obstruction and no free air -Nonambulatory at baseline along with poor low volume solid intake contributing to chronic constipation-also takes chronic narcotics  Active Problems:   Abnormal urinalysis -Patient presents with abnormal urinalysis, elevated serum lactate and leukocytosis concerning for possible UTI -No flank or suprapubic pain on exam -Possibility abnormal urinalysis and abnormal labs secondary to severe  dehydration in context of hypoperfusion and recent hypotension -Lactic acid corrected rapidly with hydration and hypotension has responded to fluids I do not think these abnormal clinical findings related to sepsis physiology -Continue empiric Rocephin -Obtain urine culture -History of recurrent UTI    Dehydration -Patient has poor oral intake at baseline which is further aggravated by dysphagia and specialized diet-does well drinking out of specialized sippy cup  -Patient with recent large volume diarrhea 1 week ago that has resolved-no associated fevers and no recent antibiotic -Recent recurrent nausea and vomiting at home also contributing to dehydrated state -Renal function stable with mild elevation in BUN -Does appear hemoconcentrated noting baseline hemoglobin around 9 and current hemoglobin 11 -Elevated anion gap acidosis in context of chronic kidney disease as well as acute abnormalities as described above  -IV fluid at 75/hr -Trial sips of clears    Elevated troponin/CAD (coronary artery disease) -Patient presented with chest pain that began after vomiting and is likely GI in etiology -Did have mild elevation  in point-of-care troponin in the context of tachycardia and hypotension prior to arrival therefore suspect related to demand ischemia -Discussed with patient's daughter utility of cycling troponin emphasizing that with patient's multiple medical conditions (including severe chronic kidney disease) interventions (other than adjusting medications) would not be pursued and therefore no utility in following troponin especially since this involves cyclic venapuncture-opted to not cycle TNI -Current blood pressure suboptimal so holding Imdur -Recurrent nausea and vomiting so hold statin and Plavix for now    Current chronic use of systemic steroids -In context of known COPD as well as PMR -Low-dose prednisone at home -Solu-Medrol 40 mg IV daily while NPO     CKD (chronic kidney  disease), stage IV  -Renal function stable and at baseline    Dysphagia/history of CVA -On minimally thickened liquids at home -Also has specialized sippy cup that only releases 1 teaspoon per sip-we will continue utilization during hospitalization -Pured diet at home    Diastolic heart failure, NYHA class 1  -Appears clinically compensated and actually euvolemic -Was not on beta-blocker prior to admission -No ACEI/ARB secondary to severity of CKD -At home utilizes Lasix prn weight gain -Strict I's/O -No indication to repeat echocardiogram this admission    Hypothyroidism, adult -Utilize IV Synthroid    COPD (chronic obstructive pulmonary disease)  -Steroids as above -Continue Flonase, Advair, DuoNeb and Spiriva -Compensated currently without wheezing    **Additional lab, imaging and/or diagnostic evaluation at discretion of supervising physician  DVT prophylaxis: Lovenox Code Status: DNR Family Communication: Daughter Disposition Plan: Home Consults called: None    Tija Biss L. ANP-BC [facility] Pager [phone]   If 7PM-7AM, please contact night-coverage [email or web address] Password [facility]  08/12/2017, 12:45 PM

## 2017-08-13 NOTE — Progress Notes (Signed)
Pt cannot tolerate oral contrast, will notify on call MD.

## 2017-08-13 NOTE — ED Triage Notes (Signed)
Patient arrived with EMS from home , reports left lower chest pain radiating to upper back this morning with brown emesis , denies SOB , no cough or diaphoresis , she is a hospice patient ( heart failure/kidney disease) ,DNR status , hypotensive at home 87/54 /HR=130's .

## 2017-08-13 NOTE — ED Notes (Signed)
Pt given a mouth swab and informed she cannot have anything to drink right now, per Carlis Abbott, Therapist, sports.

## 2017-08-13 NOTE — ED Provider Notes (Signed)
[facility] EMERGENCY DEPARTMENT Provider Note   CSN: [id number] Arrival date & time: 08/24/2017  0350     History   Chief Complaint Chief Complaint  Patient presents with  . Chest Pain    HPI Rebecca [name] is a 77 y.o. female.  HPI Patient is a hospice patient.  Presents with neck and back pain.  Goes to her upper abdomen.  Has had reported fair amount of nausea and vomiting.  Has had a cough.  She is a DNR.  States last bowel movement was around 4 days ago.  Hypotensive in the 80s at home with heart rate of 130.  Pain is dull.  Worse with movements.  States she feels bad. Past Medical History:  Diagnosis Date  . AKI (acute kidney injury) ([place])   . Anemia in chronic renal disease   . Aortic atherosclerosis ([place]) 01/27/2017  . CAD (coronary artery disease) 2006   a. s/p DES to RCA in 2006 b. low-risk NST in 2014 c. cath in 08/2015 showing patent RCA stent with nonobstructive disease  . CKD (chronic kidney disease) stage 4, GFR 15-29 ml/min (HCC)   . Complication of anesthesia   . COPD (chronic obstructive pulmonary disease) ([place])   . Diabetes mellitus   . Diastolic heart failure, NYHA class 1 ([place])   . Dyslipidemia   . Dysuria 01/06/2015  . Frozen shoulder    right  . Gastroesophageal reflux   . HAP (hospital-acquired pneumonia)   . Headache   . Hyperkalemia 01/01/2016  . Hypothyroidism   . 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 Obesity   . Pancreatic mass 01/27/2017  . Pneumonia 06/2016  . Polymyalgia rheumatica ([place])   . PONV (postoperative nausea and vomiting)   . Renal insufficiency   . Respiratory distress   . Schizoaffective disorder ([place])   . Sepsis due to pneumonia ([place])   . Shortness of breath   . Stroke [facility])     Patient Active Problem List   Diagnosis Date Noted  . Poor appetite   . Acute esophagitis   . Poor fluid intake   . Dysphagia 04/27/2017  . Pulmonary infiltrates c/w recurrent aspiration pneumonitis  vs hcap 03/30/2017  . Prerenal azotemia 03/30/2017  . Dyspnea on exertion 03/29/2017  . Weakness   . Hypervolemia   . Lethargy   . Pain of right hip joint   . Acute right hip pain 03/14/2017  . Physical debility 03/08/2017  . Recurrent UTI 03/08/2017  . Ascites 03/08/2017  . Chronic pain disorder 03/07/2017  . Chronic constipation   . Community acquired pneumonia   . Palliative care encounter   . Goals of care, counseling/discussion   . TIA (transient ischemic attack) 01/27/2017  . Pancreatic mass 01/27/2017  . Aortic atherosclerosis ([place]) 01/27/2017  . Hyponatremia 01/25/2017  . Generalized weakness 01/25/2017  . Fatigue 01/11/2017  . Mixed hyperlipidemia 12/02/2016  . Encephalopathy 11/20/2016  . Altered mental status 11/19/2016  . Aspiration pneumonia of both lower lobes due to gastric secretions ([place]) 10/10/2016  . Aspiration pneumonia ([place]) 10/07/2016  . Lobar pneumonia, unspecified organism ([place]) 10/07/2016  . Ileus ([place]) 10/07/2016  . AKI (acute kidney injury) ([place]) 09/12/2016  . HAP (hospital-acquired pneumonia) 09/12/2016  . Respiratory distress 09/12/2016  . Pressure injury of skin 07/15/2016  . Essential hypertension 07/14/2016  . Acute encephalopathy 07/14/2016  . Coronary artery disease involving native coronary artery of native heart without angina pectoris 05/14/2016  . CKD stage 4,  GFR 15-29 ml/min  08/11/2015  . 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 Leukocytosis 01/28/2014  . History of TIA - May 2015- Plavix 10/16/2013  . Polymyalgia rheumatica ([place]) 10/03/2012  . Chronic diastolic (congestive) heart failure ([place]) 07/15/2012  . Trochanteric bursitis of right hip 04/11/2012  . Dysuria 08/30/2011  . History of small bowel obstruction 11/04/2010  . Schizoaffective disorder ([place]) 01/10/2008  . Dyslipidemia 01/25/2007  . Hypothyroidism 08/11/2006  . Anemia in chronic renal disease 08/11/2006  . GASTROESOPHAGEAL REFLUX, NO  ESOPHAGITIS 08/11/2006    Past Surgical History:  Procedure Laterality Date  . ABDOMINAL HYSTERECTOMY    . CARDIAC CATHETERIZATION N/A 08/13/2015   Procedure: Left Heart Cath and Coronary Angiography;  Surgeon: Jettie Booze, MD;  Location: [facility];  Service: Cardiovascular;  Laterality: N/A;  . CATARACT EXTRACTION    . CHOLECYSTECTOMY    . CORONARY ANGIOPLASTY WITH STENT PLACEMENT  2006   [place] DES to RCA, 50 % residual  . PARTIAL HYSTERECTOMY  1995    OB History    No data available       Home Medications    Prior to Admission medications   Medication Sig Start Date End Date Taking? Authorizing Provider  acetaminophen (TYLENOL) 500 MG tablet Take 1 tablet (500 mg total) by mouth every 6 (six) hours as needed (pain). Patient taking differently: Take 1,000 mg 2 (two) times daily as needed by mouth (pain).  11/21/16  Yes Arrien, Jimmy Picket, MD  bisacodyl (DULCOLAX) 10 MG suppository Place 1 suppository (10 mg total) rectally daily as needed for moderate constipation. 05/11/17  Yes Aline August, MD  cetirizine (ZYRTEC) 10 MG tablet Take 10 mg by mouth at bedtime.    Yes [provider]  chlorhexidine (PERIDEX) 0.12 % solution 15 mLs by Mouth Rinse route 2 (two) times daily. 05/11/17  Yes Aline August, MD  clopidogrel (PLAVIX) 75 MG tablet Take 1 tablet (75 mg total) by mouth daily with breakfast. Patient taking differently: Take 75 mg by mouth at bedtime.  10/18/13  Yes Thurnell Lose, MD  clotrimazole (LOTRIMIN) 1 % cream Apply topically 2 (two) times daily. Apply to under left breast. May also apply under right breast as needed 05/11/17  Yes Aline August, MD  cloZAPine (CLOZARIL) 100 MG tablet Take 300 mg by mouth at bedtime. 11/04/16  Yes [provider]  colchicine 0.6 MG tablet Take 0.6 mg by mouth See admin instructions. 5 days on then 3 days off   Yes [provider]  Cranberry (SM CRANBERRY) 300 MG tablet Take 300 mg daily  with breakfast by mouth.    Yes [provider]  diltiazem (CARDIZEM CD) 240 MG 24 hr capsule Take 240 mg by mouth every morning.   Yes [provider]  fluticasone (FLONASE) 50 MCG/ACT nasal spray Place 2 sprays daily as needed into both nostrils (seasonal allergies).  03/04/17  Yes [provider]  Fluticasone-Salmeterol (ADVAIR) 250-50 MCG/DOSE AEPB Inhale 1 puff into the lungs at bedtime.   Yes [provider]  furosemide (LASIX) 20 MG tablet Take 20 mg daily as needed by mouth (weight gain of 3 lbs in 24 hours).   Yes [provider]  Guaifenesin 1200 MG TB12 Take 1,200 mg by mouth daily. 06/09/17  Yes [provider]  ipratropium-albuterol (DUONEB) 0.5-2.5 (3) MG/3ML SOLN Inhale 3 mLs into the lungs 3 (three) times daily.  11/05/16  Yes [provider]  levothyroxine ([place], [place]) 75  MCG tablet Take 75 mcg by mouth daily before breakfast.   Yes [provider]  Liniments (SALONPAS PAIN RELIEF PATCH EX) Place 1 patch See admin instructions onto the skin. Apply one patch to lower back and one patch to hip every morning as needed for pain, remove after 8 hours   Yes [provider]  Morphine Sulfate (MORPHINE CONCENTRATE) 10 MG/0.5ML SOLN concentrated solution Take 0.25 mLs (5 mg total) by mouth every 3 (three) hours as needed for severe pain or shortness of breath (air hunger). 05/11/17  Yes Aline August, MD  Multiple Vitamin (MULTIVITAMIN WITH MINERALS) TABS tablet Take 1 tablet by mouth every 3 (three) days.    Yes [provider]  nitroGLYCERIN (NITROSTAT) 0.4 MG SL tablet Place 1 tablet (0.4 mg total) under the tongue every 5 (five) minutes as needed. For chest pain Patient taking differently: Place 0.4 mg under the tongue every 5 (five) minutes as needed for chest pain.  01/11/17  Yes Strader, [place] M, PA-C  ondansetron (ZOFRAN) 4 MG tablet Take 1 tablet (4 mg total) by mouth every 8 (eight)  hours as needed for nausea or vomiting. 05/11/17  Yes Aline August, MD  pantoprazole (PROTONIX) 40 MG tablet Take 1 tablet (40 mg total) by mouth daily. Patient taking differently: Take 40 mg daily with breakfast by mouth.  04/03/13  Yes Hilty, Nadean Corwin, MD  polyethylene glycol powder (GLYCOLAX/MIRALAX) powder Take 17 g by mouth 2 (two) times daily. [place] Patient taking differently: Take 17 g 2 (two) times daily by mouth. Mix in 8 oz liquid and drink 01/28/17  Yes Samuella Cota, MD  pravastatin (PRAVACHOL) 20 MG tablet Take 20 mg by mouth at bedtime.   Yes [provider]  predniSONE (DELTASONE) 10 MG tablet Take 1 tablet (10 mg total) by mouth daily with breakfast. 10/11/16  Yes Charlynne Cousins, MD  Probiotic Product (PROBIOTIC PO) Take 1 capsule daily with breakfast by mouth.    Yes [provider]  senna-docusate (SENNA-S) 8.6-50 MG tablet Take 2 tablets at bedtime by mouth.   Yes [provider]  tamsulosin (FLOMAX) 0.4 MG CAPS capsule Take 1 capsule (0.4 mg total) by mouth daily. Patient taking differently: Take 0.4 mg daily with breakfast by mouth.  03/27/17  Yes Thurnell Lose, MD  Tiotropium Bromide Monohydrate (SPIRIVA RESPIMAT) 2.5 MCG/ACT AERS Inhale 2 puffs daily into the lungs.    Yes [provider]  Vitamin D, Ergocalciferol, (DRISDOL) 50000 UNITS CAPS capsule Take 1 capsule (50,000 Units total) by mouth every 7 (seven) days. Patient taking differently: Take 50,000 Units by mouth every Wednesday.  08/27/13  Yes Kinnie Feil, MD  hydrocortisone (ANUSOL-HC) 2.5 % rectal cream Place rectally 2 (two) times daily. Patient not taking: Reported on 08/22/2017 05/11/17   Aline August, MD  isosorbide mononitrate (IMDUR) 60 MG 24 hr tablet Take 1 tablet (60 mg total) by mouth daily. Patient not taking: Reported on 08/12/2017 03/14/13   Isaiah Serge, NP    Family History Family History  Problem Relation Age of Onset  . Diabetes  Mother   . Heart disease Mother   . Heart disease Father   . Arthritis Father   . Healthy Sister   . Heart disease Brother   . Cancer Brother        colon ca  . Osteoporosis Sister   . Heart disease Brother   . Diabetes Brother   . Heart disease Brother   .  Diabetes Brother   . Diabetes Brother   . Healthy Brother   . Healthy Brother   . Healthy Brother   . Cancer Maternal Uncle        blood condition  . Cancer Daughter        breast ca  . Cancer Cousin        NHL    Social History Social History   Tobacco Use  . Smoking status: Former Smoker    Packs/day: 1.00    Years: 35.00    Pack years: 35.00    Last attempt to quit: 04/14/2004    Years since quitting: 13.3  . Smokeless tobacco: Never Used  Substance Use Topics  . Alcohol use: No  . Drug use: No     Allergies   Levaquin [levofloxacin] and Tape   Review of Systems Review of Systems  Constitutional: Positive for appetite change and fatigue.  HENT: Negative for congestion.   Respiratory: Positive for cough.   Cardiovascular: Positive for chest pain.  Gastrointestinal: Positive for abdominal pain, constipation, nausea and vomiting.  Genitourinary: Negative for flank pain.  Musculoskeletal: Positive for back pain and neck pain.  Neurological: Negative for light-headedness.  Hematological: Negative for adenopathy.  Psychiatric/Behavioral: Negative for confusion.     Physical Exam Updated Vital Signs BP (!) 102/44 (BP Location: Right Arm)   Pulse (!) 102   Temp 99 F (37.2 C) (Rectal)   Resp 16   Ht 5\' 3"  (1.6 m)   Wt 63.5 kg (140 lb)   SpO2 96%   BMI 24.80 kg/m   Physical Exam  Constitutional: She appears well-developed.  HENT:  Head: Normocephalic.  Neck: Neck supple.  Cardiovascular: Tachycardia present.  Tachycardia  Pulmonary/Chest:  Diffuse harsh breath sounds.  Abdominal:  Mild distention with upper abdominal tenderness.  No hernia palpated.  Musculoskeletal:       Right lower  leg: She exhibits edema.       Left lower leg: She exhibits edema.  Neurological: She is alert.  Skin: Skin is warm. Capillary refill takes less than 2 seconds.  Patient feels warm  Psychiatric: She has a normal mood and affect.     ED Treatments / Results  Labs (all labs ordered are listed, but only abnormal results are displayed) Labs Reviewed  BASIC METABOLIC PANEL - Abnormal; Notable for the following components:      Result Value   CO2 21 (*)    Glucose, Bld 118 (*)    BUN 26 (*)    Creatinine, Ser 1.75 (*)    GFR calc non Af Amer 27 (*)    GFR calc Af Amer 31 (*)    Anion gap 16 (*)    All other components within normal limits  CBC - Abnormal; Notable for the following components:   WBC 21.2 (*)    RBC 3.72 (*)    Hemoglobin 11.3 (*)    HCT 34.8 (*)    All other components within normal limits  HEPATIC FUNCTION PANEL - Abnormal; Notable for the following components:   Total Protein 5.1 (*)    Albumin 2.4 (*)    ALT 11 (*)    All other components within normal limits  URINALYSIS, ROUTINE W REFLEX MICROSCOPIC - Abnormal; Notable for the following components:   Color, Urine AMBER (*)    APPearance HAZY (*)    Protein, ur 30 (*)    Leukocytes, UA LARGE (*)    Bacteria, UA FEW (*)  Squamous Epithelial / LPF 0-5 (*)    All other components within normal limits  I-STAT TROPONIN, ED - Abnormal; Notable for the following components:   Troponin i, poc 0.19 (*)    All other components within normal limits  I-STAT CG4 LACTIC ACID, ED - Abnormal; Notable for the following components:   Lactic Acid, Venous 2.39 (*)    All other components within normal limits  CULTURE, BLOOD (ROUTINE X 2)  CULTURE, BLOOD (ROUTINE X 2)  PROTIME-INR  LIPASE, BLOOD  I-STAT CG4 LACTIC ACID, ED    EKG  EKG Interpretation  Date/Time:  Saturday August 13 2017 06:54:17 EST Ventricular Rate:  130 PR Interval:    QRS Duration: 87 QT Interval:  299 QTC Calculation: 440 R Axis:   45 Text  Interpretation:  Sinus tachycardia Probable LVH with secondary repol abnrm Confirmed by Davonna Belling [phone]) on 08/29/2017 7:03:24 AM       Radiology Dg Chest 2 View  Result Date: 08/17/2017 CLINICAL DATA:  Chest pain EXAM: CHEST  2 VIEW COMPARISON:  May 07, 2007 2 FINDINGS: The cardiomediastinal silhouette is stable. No pneumothorax. The right lung is clear. Mild opacity seen in the left base with blunting of left costophrenic angle. There is opacity in this region on the November 2018 studies. IMPRESSION: 1. There is opacity in the left base which is improved since November 2018. Whether this recommends scarring from a previous infiltrate or redevelopment of a new infiltrate is unclear. Recommend attention on follow-up. No other acute abnormalities. Electronically Signed   By: Dorise Bullion III M.D   On: 09/11/2017 07:39   Dg Abd 2 Views  Result Date: 09/09/2017 CLINICAL DATA:  Vomiting EXAM: ABDOMEN - 2 VIEW COMPARISON:  April 27, 2017 FINDINGS: Moderate fecal loading in the colon. The distal transverse colon is distended up to 8.7 cm. There is gas extending to the level of the rectum. Air-filled prominent loops of small bowel are seen in the left side of the abdomen measuring up to 4 cm. No free air, portal venous gas, or pneumatosis. No other acute abnormalities. IMPRESSION: 1. Mildly dilated loops of small bowel are seen in the left abdomen. Air-filled dilated transverse colon is seen with gas extending to the rectum. The findings could represent ileus or early obstruction. Recommend clinical correlation and follow-up as clinically warranted. Electronically Signed   By: Dorise Bullion III M.D   On: 08/22/2017 07:41    Procedures Procedures (including critical care time)  Medications Ordered in ED Medications  vancomycin (VANCOCIN) IVPB 750 mg/150 ml premix (not administered)  piperacillin-tazobactam (ZOSYN) IVPB 3.375 g (not administered)  piperacillin-tazobactam (ZOSYN) IVPB  3.375 g (3.375 g Intravenous New Bag/Given 08/28/2017 0911)  vancomycin (VANCOCIN) IVPB 1000 mg/200 mL premix (1,000 mg Intravenous New Bag/Given 08/25/2017 1024)  sodium chloride 0.9 % bolus 1,000 mL (0 mLs Intravenous Stopped 08/31/2017 0805)  sodium chloride 0.9 % bolus 500 mL (500 mLs Intravenous New Bag/Given 08/27/2017 1031)     Initial Impression / Assessment and Plan / ED Course  I have reviewed the triage vital signs and the nursing notes.  Pertinent labs & imaging results that were available during my care of the patient were reviewed by me and considered in my medical decision making (see chart for details).     Patient presents with chest pain back pain and emesis.  Also has abdominal pain.  She is [place] on hospice.  Was hypotensive at home pressures in the 70s and 80s blood pressures  improved with some IV fluids here.  Heart rate decreased from 130s down to right around 100.  Creatinine close to baseline.  EKG reassuring.  Discussed with patient's daughter and patient would not want surgery but would want antibiotics.  Empirically given antibiotics for potential infection.  Initial lactic elevated but has decreased.  Will admit to hospitalist.  Abdominal x-ray showed nonspecific findings could be ileus versus early obstruction.  Final Clinical Impressions(s) / ED Diagnoses   Final diagnoses:  Vomiting  Non-intractable vomiting with nausea, unspecified vomiting type  Dehydration  Encounter for palliative care    ED Discharge Orders    None       Davonna Belling, MD 08/27/2017 1143

## 2017-08-13 NOTE — Progress Notes (Signed)
Fluid consistency changed to nectar thick, multiple pages to MD unreturned

## 2017-08-14 DIAGNOSIS — K5909 Other constipation: Secondary | ICD-10-CM

## 2017-08-14 DIAGNOSIS — J690 Pneumonitis due to inhalation of food and vomit: Secondary | ICD-10-CM

## 2017-08-14 DIAGNOSIS — E860 Dehydration: Secondary | ICD-10-CM

## 2017-08-14 LAB — CBC
HCT: 24.7 % — ABNORMAL LOW (ref 36.0–46.0)
Hemoglobin: 8.2 g/dL — ABNORMAL LOW (ref 12.0–15.0)
MCH: 31.1 pg (ref 26.0–34.0)
MCHC: 33.2 g/dL (ref 30.0–36.0)
MCV: 93.6 fL (ref 78.0–100.0)
Platelets: 184 10*3/uL (ref 150–400)
RBC: 2.64 MIL/uL — AB (ref 3.87–5.11)
RDW: 15.7 % — AB (ref 11.5–15.5)
WBC: 22 10*3/uL — ABNORMAL HIGH (ref 4.0–10.5)

## 2017-08-14 LAB — BASIC METABOLIC PANEL
Anion gap: 8 (ref 5–15)
BUN: 24 mg/dL — AB (ref 6–20)
CALCIUM: 7.6 mg/dL — AB (ref 8.9–10.3)
CO2: 19 mmol/L — AB (ref 22–32)
Chloride: 110 mmol/L (ref 101–111)
Creatinine, Ser: 1.27 mg/dL — ABNORMAL HIGH (ref 0.44–1.00)
GFR calc non Af Amer: 39 mL/min — ABNORMAL LOW (ref >60)
GFR, EST AFRICAN AMERICAN: 46 mL/min — AB (ref >60)
Glucose, Bld: 94 mg/dL (ref 65–99)
Potassium: 4.4 mmol/L (ref 3.5–5.1)
SODIUM: 137 mmol/L (ref 135–145)

## 2017-08-14 MED ORDER — CLOZAPINE 100 MG PO TABS
300.0000 mg | ORAL_TABLET | Freq: Every day | ORAL | Status: DC
  Administered 2017-08-14 – 2017-08-20 (×7): 300 mg via ORAL
  Filled 2017-08-14 (×7): qty 3

## 2017-08-14 MED ORDER — SODIUM CHLORIDE 0.9 % IV SOLN
INTRAVENOUS | Status: DC
  Administered 2017-08-14 – 2017-08-15 (×2): via INTRAVENOUS

## 2017-08-14 MED ORDER — SENNOSIDES-DOCUSATE SODIUM 8.6-50 MG PO TABS
1.0000 | ORAL_TABLET | Freq: Two times a day (BID) | ORAL | Status: DC
  Administered 2017-08-14 – 2017-08-17 (×7): 1 via ORAL
  Filled 2017-08-14 (×7): qty 1

## 2017-08-14 MED ORDER — MAGNESIUM HYDROXIDE 400 MG/5ML PO SUSP
30.0000 mL | Freq: Once | ORAL | Status: AC
  Administered 2017-08-14: 30 mL via ORAL
  Filled 2017-08-14: qty 30

## 2017-08-14 MED ORDER — TAMSULOSIN HCL 0.4 MG PO CAPS
0.4000 mg | ORAL_CAPSULE | Freq: Every day | ORAL | Status: DC
  Administered 2017-08-14 – 2017-08-18 (×5): 0.4 mg via ORAL
  Filled 2017-08-14 (×5): qty 1

## 2017-08-14 NOTE — Progress Notes (Signed)
64fr catheter placed as ordered for urinary retention. Procedure tolerated well by pt

## 2017-08-14 NOTE — Progress Notes (Signed)
Pt has not voided today. Bladder scanned with 194cc noted. MD paged and notified . MD ordered repeat bladder scan in 4hours and continuing rehydration via iv fluids as pt is dehydrated

## 2017-08-14 NOTE — Progress Notes (Signed)
Pt unable to urinate, bladder scan showed 341ml. Paged NP on call, In and out cath ordered. 529ml of amber with sediments urine out. Will continue to monitor.

## 2017-08-14 NOTE — Progress Notes (Signed)
[facility] PROGRESS NOTE  Rebecca [name] WNI:[id number] DOB: 1940/12/12 DOA: 08/19/2017 PCP: [name], MD  Brief summary   77 y.o. female with medical history significant for multiple medical problems including but not limited to: CAD with DES to RCA in 2006 and last catheterization in 2017, MGUS, PMR, COPD, chronic steroids, dyslipidemia, chronic diastolic heart failure, history of diabetes no longer medications, stage IV chronic kidney disease, anemia of chronic kidney disease, schizoaffective disorder, history of stroke, history of GERD, and hypothyroidism.  Patient also has significant failure to thrive and post stroke dysphagia and episodic recurrent aspiration pneumonia.  She was last hospitalized in November 2018 again due to aspiration pneumonia and possible recurrent UTI.  At that time goals of care were discussed and DNR form and most form were completed.  Patient was subsequently discharged with home health hospice primarily for cardiac issues.  Patient was brought to the ER today because of recurrent nausea and vomiting of brown emesis with chest discomfort that radiated to the upper back.  Upon EMS arrival to the home patient was found to be hypotensive with a blood pressure of 87/54 and heart rate of 130s.  Patient reports she was aware of these palpitations.  In the ER patient appeared to be dehydrated with mild elevation in BUN from baseline.  She had a leukocytosis with a white count of 21,200 and she will not obtained and an associated abnormal urinalysis.  Patient's poc troponin was elevated at 0.19 in the context of recent hypotension and elevated lactic acid.  Patient has been given fluid challenges with normalization of lactic acid.  Because of elevated white count, hypotension and abnormal labs as described patient was initially treated as sepsis and given broad-spectrum antibiotics with vancomycin and Zosyn.  Chest x-ray was unremarkable.  Abdominal films revealed small  bowel ileus in the context of distal transverse colon obstipation.  Patient's hospice nurse directed family to call EMS and presented to ER.    Assessment/Plan:   Ileus 2/2 Acute on chronic constipation. Patient presents with nausea and vomiting of brown colored emesis with associated chest discomfort radiating to back with radiographic evidence of ileus and obstipation at the distal transverse colon. -no further vomiting. abdominal exam is benign. +BS. No s/s of bowel obstruction. Cont  aggressive with laxatives. Will try enema if needed. Nonambulatory at baseline along with poor low volume solid intake contributing to chronic constipation-also takes chronic narcotics  Abnormal urinalysis. UTI. Patient presents with abnormal urinalysis, elevated serum lactate and leukocytosis concerning for possible UTI on admission. Lactate is trending down  -Continue empiric Rocephin. Pend urine culture  Dehydration. Patient has poor oral intake at baseline which is further aggravated by dysphagia and specialized diet-does well drinking out of specialized sippy cup  -IV fluid at 75/hr. Until able to take PO  Elevated troponin/CAD (coronary artery disease). As per discussions with patient's daughter, no further agressive work up. Will resume statin and Plavix , NTG when able   Current chronic use of systemic steroids. In context of known COPD as well as PMR. Low-dose prednisone at home. Solu-Medrol 40 mg IV daily while NPO   CKD (chronic kidney disease), stage IV. Renal function stable and at baseline  Dysphagia/history of CVA. On minimally thickened liquids at home. Also has specialized sippy cup that only releases 1 teaspoon per sip-we will continue utilization during hospitalization. Pured diet at home -recurrent aspiration pneumonia. On iv antibiotics, pend cultures.   Hypothyroidism, adult. Utilize IV Synthroid  COPD (chronic  obstructive pulmonary disease). Steroids as above. Continue  Flonase, Advair, DuoNeb and Spiriva. Compensated currently without wheezing  Prognosis is guarded with ongoing recurrent episodes of sepsis, aspiration pneumonia, chronic failure to thrive, heart failure, renal failure.  -apparently, patient with recently with hospice at home now readmitted. Will consult palliative care   Code Status: DNR Family Communication: d/w patient, RN. No family at the bedside  (indicate person spoken with, relationship, and if by phone, the number) Disposition Plan: pend further work up    Consultants:  Palliative care   Procedures:  none  Antibiotics: Anti-infectives (From admission, onward)   Start     Dose/Rate Route Frequency Ordered Stop   08/14/17 1000  vancomycin (VANCOCIN) IVPB 750 mg/150 ml premix  Status:  Discontinued     750 mg 150 mL/hr over 60 Minutes Intravenous Every 24 hours 08/12/2017 0834 08/20/2017 1201   08/26/2017 2200  cefTRIAXone (ROCEPHIN) 1 g in sodium chloride 0.9 % 100 mL IVPB     1 g 200 mL/hr over 30 Minutes Intravenous Every 24 hours 09/07/2017 1202     09/10/2017 1600  piperacillin-tazobactam (ZOSYN) IVPB 3.375 g  Status:  Discontinued     3.375 g 12.5 mL/hr over 240 Minutes Intravenous Every 8 hours 08/23/2017 0834 09/08/2017 1201   09/03/2017 0715  piperacillin-tazobactam (ZOSYN) IVPB 3.375 g     3.375 g 100 mL/hr over 30 Minutes Intravenous  Once 08/12/2017 0711 08/20/2017 1247   09/05/2017 0715  vancomycin (VANCOCIN) IVPB 1000 mg/200 mL premix     1,000 mg 200 mL/hr over 60 Minutes Intravenous  Once 08/20/2017 0711 08/15/2017 1247      (indicate start date, and stop date if known)  HPI/Subjective: She reports feeling better. Alert. No distress. No bowel movement yet. +BS  Objective: Vitals:   09/03/2017 2100 08/14/17 0408  BP: (!) 110/50 120/62  Pulse: 95 91  Resp: 16 16  Temp: 97.7 F (36.5 C) 97.8 F (36.6 C)  SpO2: 100% 98%    Intake/Output Summary (Last 24 hours) at 08/14/2017 1105 Last data filed at 08/14/2017 0900 Gross per  24 hour  Intake 2347.5 ml  Output 500 ml  Net 1847.5 ml   Filed Weights   09/07/2017 0654  Weight: 63.5 kg (140 lb)    Exam:   General:  No distress. Looks chronically ill   Cardiovascular: s1,s2 rrr  Respiratory: no wheezing   Abdomen: soft, distended, NT. +BS  Musculoskeletal: no leg edema    Data Reviewed: Basic Metabolic Panel: Recent Labs  Lab 08/27/2017 0704 08/14/17 0645  NA 140 137  K 3.9 4.4  CL 103 110  CO2 21* 19*  GLUCOSE 118* 94  BUN 26* 24*  CREATININE 1.75* 1.27*  CALCIUM 9.7 7.6*   Liver Function Tests: Recent Labs  Lab 09/10/2017 0704  AST 23  ALT 11*  ALKPHOS 55  BILITOT 0.8  PROT 5.1*  ALBUMIN 2.4*   Recent Labs  Lab 09/11/2017 0704  LIPASE 34   No results for input(s): AMMONIA in the last 168 hours. CBC: Recent Labs  Lab 09/08/2017 0704 08/29/2017 2231 08/14/17 0645  WBC 21.2* 14.6* 22.0*  NEUTROABS  --  13.6*  --   HGB 11.3* 8.1* 8.2*  HCT 34.8* 24.5* 24.7*  MCV 93.5 93.5 93.6  PLT 299 150 184   Cardiac Enzymes: No results for input(s): CKTOTAL, [name], [name], TROPONINI in the last 168 hours. BNP (last 3 results) Recent Labs    01/25/17 1644 03/20/17 1330  BNP 109.5*  611.6*    ProBNP (last 3 results) Recent Labs    03/29/17 1646  PROBNP 84.0    CBG: No results for input(s): GLUCAP in the last 168 hours.  No results found for this or any previous visit (from the past 240 hour(s)).   Studies: Ct Abdomen Pelvis Wo Contrast  Result Date: 08/14/2017 CLINICAL DATA:  Abdominal distension. Vomiting. Abdominal pain. Hospice patient. EXAM: CT ABDOMEN AND PELVIS WITHOUT CONTRAST TECHNIQUE: Multidetector CT imaging of the abdomen and pelvis was performed following the standard protocol without IV contrast. COMPARISON:  Radiographs earlier this day.  CT 01/16/2017 FINDINGS: Lower chest: Left basilar consolidation with high-density material in the lower lobe bronchi. Small left pleural effusion. Minimal patchy opacity in the  medial right lower lobe. There are coronary artery calcifications. Small pericardial effusion. Hepatobiliary: No focal lesion allowing for lack contrast. Clips in the gallbladder fossa postcholecystectomy. No biliary dilatation. Pancreas: Parenchymal atrophy. No ductal dilatation or inflammation. Cystic lesion in the pancreatic body is unchanged measuring 2.8 x 2 cm. Spleen: Normal in size without focal abnormality. Adrenals/Urinary Tract: No adrenal nodule. Bilateral renal parenchymal thinning. No hydronephrosis. Simple and hemorrhagic cysts both kidneys are unchanged from prior exam. Urinary bladder is distended and contains an air-fluid level. No bladder wall thickening. Stomach/Bowel: Chronic colonic tortuosity and moderate stool burden. There is more liquid stool in the ascending colon. No small bowel dilatation or obstruction. Enteric contrast reaches the mid small bowel. No evidence of bowel inflammation. Vascular/Lymphatic: Again seen aortic atherosclerosis. No aneurysm. No bulky adenopathy. Reproductive: Post hysterectomy.  No adnexal mass. Other: Small to moderate simple free fluid in the pelvis without organized collection. No free air. There is body wall edema. Musculoskeletal: Multilevel degenerative change in the spine with peripherally calcified disc extrusion at L2-L3 causing mass effect on the spinal canal, unchanged. IMPRESSION: 1. Colonic tortuosity with large colonic stool burden, similar to prior CT, suggesting constipation. No bowel obstruction. No bowel inflammation. 2. Left lower lobe consolidation with high-density material in the lower lobe bronchi are, suspicious for aspiration, possible aspiration of CT contrast. Patchy right lower lobe opacities, also likely aspiration. 3. Small to moderate simple free fluid in the pelvis is likely reactive, but nonspecific. No organized collection or abscess. 4. 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 5.  Aortic  Atherosclerosis (ICD10-I70.0). Electronically Signed   By: Jeb Levering M.D.   On: 08/14/2017 00:26   Dg Chest 2 View  Result Date: 09/01/2017 CLINICAL DATA:  Chest pain EXAM: CHEST  2 VIEW COMPARISON:  May 07, 2007 2 FINDINGS: The cardiomediastinal silhouette is stable. No pneumothorax. The right lung is clear. Mild opacity seen in the left base with blunting of left costophrenic angle. There is opacity in this region on the November 2018 studies. IMPRESSION: 1. There is opacity in the left base which is improved since November 2018. Whether this recommends scarring from a previous infiltrate or redevelopment of a new infiltrate is unclear. Recommend attention on follow-up. No other acute abnormalities. Electronically Signed   By: Dorise Bullion III M.D   On: 08/31/2017 07:39   Dg Abd 2 Views  Result Date: 08/12/2017 CLINICAL DATA:  Vomiting EXAM: ABDOMEN - 2 VIEW COMPARISON:  April 27, 2017 FINDINGS: Moderate fecal loading in the colon. The distal transverse colon is distended up to 8.7 cm. There is gas extending to the level of the rectum. Air-filled prominent loops of small bowel are seen in the left side of  the abdomen measuring up to 4 cm. No free air, portal venous gas, or pneumatosis. No other acute abnormalities. IMPRESSION: 1. Mildly dilated loops of small bowel are seen in the left abdomen. Air-filled dilated transverse colon is seen with gas extending to the rectum. The findings could represent ileus or early obstruction. Recommend clinical correlation and follow-up as clinically warranted. Electronically Signed   By: Dorise Bullion III M.D   On: 08/22/2017 07:41    Scheduled Meds: . enoxaparin (LOVENOX) injection  30 mg Subcutaneous Q24H  . ipratropium-albuterol  3 mL Inhalation TID  . levothyroxine  37.5 mcg Intravenous QAC breakfast  . methylPREDNISolone (SOLU-MEDROL) injection  40 mg Intravenous Daily  . mometasone-formoterol  2 puff Inhalation BID  . pantoprazole  (PROTONIX) IV  40 mg Intravenous Q24H  . sodium chloride flush  3 mL Intravenous Q12H  . tiotropium  18 mcg Inhalation Daily   Continuous Infusions: . sodium chloride 75 mL/hr at 08/14/17 0016  . cefTRIAXone (ROCEPHIN)  IV Stopped (08/24/2017 2307)    Principal Problem:   Ileus ([place]) Active Problems:   Constipation, chronic   Abnormal urinalysis   Dehydration   Elevated troponin   Diastolic heart failure, NYHA class 1 (HCC)   Hypothyroidism, adult   Current chronic use of systemic steroids   COPD (chronic obstructive pulmonary disease) (HCC)   CAD (coronary artery disease)   CKD (chronic kidney disease), stage IV ([place])    Time spent: >35 minutes     Kinnie Feil  [facility] Pager [phone]. If 7PM-7AM, please contact night-coverage at [email or web address], password [facility] 08/14/2017, 11:05 AM  LOS: 1 day

## 2017-08-15 ENCOUNTER — Other Ambulatory Visit: Payer: Self-pay

## 2017-08-15 DIAGNOSIS — I2510 Atherosclerotic heart disease of native coronary artery without angina pectoris: Secondary | ICD-10-CM

## 2017-08-15 DIAGNOSIS — J410 Simple chronic bronchitis: Secondary | ICD-10-CM

## 2017-08-15 LAB — URINE CULTURE: Culture: >=100000 — AB

## 2017-08-15 MED ORDER — CEFDINIR 125 MG/5ML PO SUSR: 250.0000 mg | Freq: Two times a day (BID) | ORAL | Status: DC

## 2017-08-15 MED ORDER — POLYETHYLENE GLYCOL 3350 17 G PO PACK
17.0000 g | PACK | Freq: Two times a day (BID) | ORAL | Status: DC
  Administered 2017-08-15 – 2017-08-20 (×10): 17 g via ORAL
  Filled 2017-08-15 (×10): qty 1

## 2017-08-15 MED ORDER — IPRATROPIUM-ALBUTEROL 0.5-2.5 (3) MG/3ML IN SOLN
3.0000 mL | Freq: Two times a day (BID) | RESPIRATORY_TRACT | Status: DC
  Administered 2017-08-15 – 2017-08-20 (×11): 3 mL via RESPIRATORY_TRACT
  Filled 2017-08-15 (×12): qty 3

## 2017-08-15 MED ORDER — CEPHALEXIN 250 MG/5ML PO SUSR
500.0000 mg | Freq: Two times a day (BID) | ORAL | Status: DC
  Administered 2017-08-15 – 2017-08-20 (×9): 500 mg via ORAL
  Filled 2017-08-15 (×11): qty 10

## 2017-08-15 NOTE — Progress Notes (Signed)
Palliative Medicine RN Note: Consult order noted.  Spoke with Cheri with [facility]; she is an active patient with them, and [facility] is going to see her today. [facility] does not see active hospice patients when they are admitted to the hospital, as hospice manages conversations re [place] and disposition.  [facility] will notify [facility] if they require our assistance, but otherwise, we will not engage with Ms Quizon. Please call our office if other assistance is needed.  Marjie Skiff Matison Nuccio, RN, BSN, [facility] Palliative Medicine Team 08/15/2017 10:19 AM Office [phone]

## 2017-08-15 NOTE — Progress Notes (Signed)
[facility] PROGRESS NOTE  Rebecca [name] EHU:[id number] DOB: 08/13/1940 DOA: 08/20/2017 PCP: [name], MD  Brief summary   77 y.o. female with medical history significant for multiple medical problems including but not limited to: CAD with DES to RCA in 2006 and last catheterization in 2017, MGUS, PMR, COPD, chronic steroids, dyslipidemia, chronic diastolic heart failure, history of diabetes no longer medications, stage IV chronic kidney disease, anemia of chronic kidney disease, schizoaffective disorder, history of stroke, history of GERD, and hypothyroidism.  Patient also has significant failure to thrive and post stroke dysphagia and episodic recurrent aspiration pneumonia.  She was last hospitalized in November 2018 again due to aspiration pneumonia and possible recurrent UTI.  At that time goals of care were discussed and DNR form and most form were completed.  Patient was subsequently discharged with home health hospice primarily for cardiac issues.   Admitted for recurrent nausea and vomiting of brown emesis with chest discomfort that radiated to the upper back.   She had a leukocytosis with a white count of 21,200 and she will not obtained and an associated abnormal urinalysis.  Patient's poc troponin was elevated at 0.19 in the context of recent hypotension and elevated lactic acid.  Patient has been given fluid challenges with normalization of lactic acid.  Because of elevated white count, hypotension and abnormal labs as described patient was initially treated as sepsis and given broad-spectrum antibiotics with vancomycin and Zosyn.  Chest x-ray was unremarkable.  Abdominal films revealed small bowel ileus in the context of distal transverse colon obstipation.  Patient is currently under the care of [facility] [place].   Assessment/Plan: Ileus 2/2 Acute on chronic constipation. Patient presents with nausea and vomiting of brown colored emesis with associated chest discomfort  radiating to back with radiographic evidence of ileus and obstipation at the distal transverse colon. -no further vomiting. abdominal exam is benign. +BS. No s/s of bowel obstruction. Continue stool softeners, will add MiraLAX twice a day.  Nonambulatory at baseline along with poor low volume solid intake contributing to chronic constipation-also takes chronic narcotics   Escherichia coli UTI. Patient presents with abnormal urinalysis, elevated serum lactate and leukocytosis concerning for possible UTI on admission. Change from Rocephin to [facility] based on sensitivity panel/urine culture results    Dehydration/dysphagia. Patient has poor oral intake at baseline which is further aggravated by dysphagia and specialized diet-does well drinking out of specialized sippy cup , SLP evaluation to make recommendations, okay to advance to solid diet    Elevated troponin/CAD (coronary artery disease). As per discussions with patient's daughter, no further agressive work up. Continue statin and Plavix , NTG when able   Current chronic use of systemic steroids. In context of known COPD as well as PMR. Low-dose prednisone at home. Solu-Medrol 40 mg IV daily while NPO   CKD (chronic kidney disease), stage IV. Renal function stable and at baseline  Dysphagia/history of CVA. On minimally thickened liquids at home. Also has specialized sippy cup that only releases 1 teaspoon per sip-we will continue utilization during hospitalization. Pured diet at home -recurrent aspiration pneumonia. On iv antibiotics, pend cultures.   Hypothyroidism, adult. Continue Synthroid, change to oral when able to eat  COPD (chronic obstructive pulmonary disease). Steroids as above. Continue Flonase, Advair, DuoNeb and Spiriva. Compensated currently without wheezing  Prognosis is guarded with ongoing recurrent episodes of sepsis, aspiration pneumonia, chronic failure to thrive, heart failure, renal failure.  -apparently,  patient with recently with hospice at home now  readmitted. Patient is followed by [facility] [place]   Code Status: DNR Family Communication: d/w patient, Therapist, sports. No family at the bedside  (indicate person spoken with, relationship, and if by phone, the number) Disposition Plan: Advance diet, anticipate discharge in 1-2 days   Consultants:  Palliative care   Procedures:  none  Antibiotics: Anti-infectives (From admission, onward)   Start     Dose/Rate Route Frequency Ordered Stop   08/14/17 1000  vancomycin (VANCOCIN) IVPB 750 mg/150 ml premix  Status:  Discontinued     750 mg 150 mL/hr over 60 Minutes Intravenous Every 24 hours 08/20/2017 0834 08/26/2017 1201   09/11/2017 2200  cefTRIAXone (ROCEPHIN) 1 g in sodium chloride 0.9 % 100 mL IVPB     1 g 200 mL/hr over 30 Minutes Intravenous Every 24 hours 08/20/2017 1202     09/08/2017 1600  piperacillin-tazobactam (ZOSYN) IVPB 3.375 g  Status:  Discontinued     3.375 g 12.5 mL/hr over 240 Minutes Intravenous Every 8 hours 08/23/2017 0834 08/28/2017 1201   08/15/2017 0715  piperacillin-tazobactam (ZOSYN) IVPB 3.375 g     3.375 g 100 mL/hr over 30 Minutes Intravenous  Once 09/07/2017 0711 09/03/2017 1247   08/30/2017 0715  vancomycin (VANCOCIN) IVPB 1000 mg/200 mL premix     1,000 mg 200 mL/hr over 60 Minutes Intravenous  Once 08/12/2017 0711 08/18/2017 1247      (indicate start date, and stop date if known)  HPI/Subjective:  Denies any nausea, vomiting, tolerating clear liquids  Objective: Vitals:   08/14/17 2048 08/15/17 0434  BP: 123/63 127/76  Pulse: 93 93  Resp: 18 19  Temp: 97.8 F (36.6 C) 98 F (36.7 C)  SpO2: 100% 99%    Intake/Output Summary (Last 24 hours) at 08/15/2017 1237 Last data filed at 08/15/2017 1059 Gross per 24 hour  Intake 1980 ml  Output 625 ml  Net 1355 ml   Filed Weights   08/20/2017 0654  Weight: 63.5 kg (140 lb)    Exam:   General:  No distress. Looks chronically ill   Cardiovascular: s1,s2  rrr  Respiratory: no wheezing   Abdomen: soft, distended, NT. +BS  Musculoskeletal: no leg edema    Data Reviewed: Basic Metabolic Panel: Recent Labs  Lab 08/16/2017 0704 08/14/17 0645  NA 140 137  K 3.9 4.4  CL 103 110  CO2 21* 19*  GLUCOSE 118* 94  BUN 26* 24*  CREATININE 1.75* 1.27*  CALCIUM 9.7 7.6*   Liver Function Tests: Recent Labs  Lab 09/08/2017 0704  AST 23  ALT 11*  ALKPHOS 55  BILITOT 0.8  PROT 5.1*  ALBUMIN 2.4*   Recent Labs  Lab 08/24/2017 0704  LIPASE 34   No results for input(s): AMMONIA in the last 168 hours. CBC: Recent Labs  Lab 08/17/2017 0704 08/17/2017 2231 08/14/17 0645  WBC 21.2* 14.6* 22.0*  NEUTROABS  --  13.6*  --   HGB 11.3* 8.1* 8.2*  HCT 34.8* 24.5* 24.7*  MCV 93.5 93.5 93.6  PLT 299 150 184   Cardiac Enzymes: No results for input(s): CKTOTAL, [name], [name], TROPONINI in the last 168 hours. BNP (last 3 results) Recent Labs    01/25/17 1644 03/20/17 1330  BNP 109.5* 611.6*    ProBNP (last 3 results) Recent Labs    03/29/17 1646  PROBNP 84.0    CBG: No results for input(s): GLUCAP in the last 168 hours.  Recent Results (from the past 240 hour(s))  Blood Culture (routine x 2)  Status: None (Preliminary result)   Collection Time: 09/05/2017  8:22 AM  Result Value Ref Range Status   Specimen Description BLOOD RIGHT ANTECUBITAL  Final   Special Requests   Final    BOTTLES DRAWN AEROBIC AND ANAEROBIC Blood Culture adequate volume   Culture   Final    NO GROWTH 1 DAY Performed at [facility], [address] [place], [place] [address]    Report Status PENDING  Incomplete  Blood Culture (routine x 2)     Status: None (Preliminary result)   Collection Time: 08/20/2017  8:23 AM  Result Value Ref Range Status   Specimen Description BLOOD LEFT FOREARM  Final   Special Requests   Final    BOTTLES DRAWN AEROBIC AND ANAEROBIC Blood Culture adequate volume   Culture   Final    NO GROWTH 1 DAY Performed at [facility], [place] [address] [place], [place] [address]    Report Status PENDING  Incomplete  Culture, Urine     Status: Abnormal   Collection Time: 08/24/2017  2:30 PM  Result Value Ref Range Status   Specimen Description URINE, RANDOM  Final   Special Requests   Final    NONE Performed at [facility], [place] [address] [place], [place] [address]    Culture >=100,000 COLONIES/mL ESCHERICHIA COLI (A)  Final   Report Status 08/15/2017 FINAL  Final   Organism ID, Bacteria ESCHERICHIA COLI (A)  Final      Susceptibility   Escherichia coli - MIC*    AMPICILLIN >=32 RESISTANT Resistant     CEFAZOLIN <=4 SENSITIVE Sensitive     CEFTRIAXONE <=1 SENSITIVE Sensitive     CIPROFLOXACIN >=4 RESISTANT Resistant     GENTAMICIN >=16 RESISTANT Resistant     IMIPENEM <=0.25 SENSITIVE Sensitive     NITROFURANTOIN 256 RESISTANT Resistant     TRIMETH/SULFA >=320 RESISTANT Resistant     AMPICILLIN/SULBACTAM 4 SENSITIVE Sensitive     PIP/TAZO <=4 SENSITIVE Sensitive     Extended ESBL NEGATIVE Sensitive     * >=100,000 COLONIES/mL ESCHERICHIA COLI     Studies: Ct Abdomen Pelvis Wo Contrast  Result Date: 08/14/2017 CLINICAL DATA:  Abdominal distension. Vomiting. Abdominal pain. Hospice patient. EXAM: CT ABDOMEN AND PELVIS WITHOUT CONTRAST TECHNIQUE: Multidetector CT imaging of the abdomen and pelvis was performed following the standard protocol without IV contrast. COMPARISON:  Radiographs earlier this day.  CT 01/16/2017 FINDINGS: Lower chest: Left basilar consolidation with high-density material in the lower lobe bronchi. Small left pleural effusion. Minimal patchy opacity in the medial right lower lobe. There are coronary artery calcifications. Small pericardial effusion. Hepatobiliary: No focal lesion allowing for lack contrast. Clips in the gallbladder fossa postcholecystectomy. No biliary dilatation. Pancreas: Parenchymal atrophy. No ductal dilatation or inflammation. Cystic lesion in the  pancreatic body is unchanged measuring 2.8 x 2 cm. Spleen: Normal in size without focal abnormality. Adrenals/Urinary Tract: No adrenal nodule. Bilateral renal parenchymal thinning. No hydronephrosis. Simple and hemorrhagic cysts both kidneys are unchanged from prior exam. Urinary bladder is distended and contains an air-fluid level. No bladder wall thickening. Stomach/Bowel: Chronic colonic tortuosity and moderate stool burden. There is more liquid stool in the ascending colon. No small bowel dilatation or obstruction. Enteric contrast reaches the mid small bowel. No evidence of bowel inflammation. Vascular/Lymphatic: Again seen aortic atherosclerosis. No aneurysm. No bulky adenopathy. Reproductive: Post hysterectomy.  No adnexal mass. Other: Small to moderate simple free fluid in the pelvis without  organized collection. No free air. There is body wall edema. Musculoskeletal: Multilevel degenerative change in the spine with peripherally calcified disc extrusion at L2-L3 causing mass effect on the spinal canal, unchanged. IMPRESSION: 1. Colonic tortuosity with large colonic stool burden, similar to prior CT, suggesting constipation. No bowel obstruction. No bowel inflammation. 2. Left lower lobe consolidation with high-density material in the lower lobe bronchi are, suspicious for aspiration, possible aspiration of CT contrast. Patchy right lower lobe opacities, also likely aspiration. 3. Small to moderate simple free fluid in the pelvis is likely reactive, but nonspecific. No organized collection or abscess. 4. 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 5.  Aortic Atherosclerosis (ICD10-I70.0). Electronically Signed   By: Jeb Levering M.D.   On: 08/14/2017 00:26    Scheduled Meds: . cloZAPine  300 mg Oral QHS  . enoxaparin (LOVENOX) injection  30 mg Subcutaneous Q24H  . ipratropium-albuterol  3 mL Inhalation BID  . levothyroxine  37.5 mcg Intravenous QAC breakfast  .  methylPREDNISolone (SOLU-MEDROL) injection  40 mg Intravenous Daily  . mometasone-formoterol  2 puff Inhalation BID  . pantoprazole (PROTONIX) IV  40 mg Intravenous Q24H  . senna-docusate  1 tablet Oral BID  . sodium chloride flush  3 mL Intravenous Q12H  . tamsulosin  0.4 mg Oral QPC supper  . tiotropium  18 mcg Inhalation Daily   Continuous Infusions: . sodium chloride 75 mL/hr at 08/15/17 0633  . cefTRIAXone (ROCEPHIN)  IV Stopped (08/14/17 2249)    Principal Problem:   Ileus ([place]) Active Problems:   Constipation, chronic   Abnormal urinalysis   Dehydration   Elevated troponin   Diastolic heart failure, NYHA class 1 (HCC)   Hypothyroidism, adult   Current chronic use of systemic steroids   COPD (chronic obstructive pulmonary disease) (HCC)   CAD (coronary artery disease)   CKD (chronic kidney disease), stage IV ([place])    Time spent: >35 minutes     Reyne Dumas  [facility] Pager [phone]. If 7PM-7AM, please contact night-coverage at [email or web address], password [employer] 08/15/2017, 12:37 PM  LOS: 2 days

## 2017-08-15 NOTE — Consult Note (Signed)
[place]:   Met with pt and her daughter at bedside. Pt is awake and states she is feeling better today. She is an active pt with [facility] [place]. Spoke to our Market researcher and this will be a related hospitalization. Placed her plan of care and Face sheet on the shadow chart. Pt is a DNR and wants to return home when she is able. Thank you for helping provide her care. Hospital Liaison   Webb Silversmith RN   Please call with questions or concerns. [phone]

## 2017-08-15 NOTE — Evaluation (Signed)
Physical Therapy Evaluation Patient Details Name: Rebecca [name] MRN: [id number] DOB: 1940/11/13 Today's Date: 08/15/2017   History of Present Illness  77yo female with multiple medical problems, failure to thrive, dysphagia, aspiration pneumonia now brought to ED with nausea, brown emesis, chest discomfort, hypotension, elevated troponins. Diagonsed with ileus due to chronic constipation. PMH AKI, CAD, CKD, COPD, DM, heart failure, frozen shoulder, hypothyroidism, polymyalgia rheumatica, renal insufficiency, schizoaffective disorder, CVA, cardiac cath   Clinical Impression   Patient received in bed with daughter present, both very pleasant and willing to participate in PT evaluation this afternoon. She is able to perform functional bed mobility with MinA and does require Min guard to safely maintain upright sitting at EOB today; able to perform functional transfers with ModA and cues for sequencing and safety, then able to scoot her hips back in the chair with Min guard/cues for form. She was left up in the chair with all needs met, alarm set this afternoon. She will continue to benefit from skilled PT services in the acute setting as well as possible skilled HHPT services to further address functional deficits if hospice will support this recommendation.     Follow Up Recommendations Home health PT;Other (comment)(as able with hospice in the picture )    Equipment Recommendations  None recommended by PT    Recommendations for Other Services       Precautions / Restrictions Precautions Precautions: Fall;Other (comment) Precaution Comments: non-ambulatory at baseline  Restrictions Weight Bearing Restrictions: No      Mobility  Bed Mobility Overal bed mobility: Needs Assistance Bed Mobility: Supine to Sit     Supine to sit: Min assist     General bed mobility comments: MinA to bring legs over side of bed and to power up trunk; requires Min guard sitting at EOB for safety    Transfers Overall transfer level: Needs assistance Equipment used: 1 person hand held assist Transfers: Sit to/from [facility] Sit to Stand: Mod assist Stand pivot transfers: Mod assist       General transfer comment: ModA for all functional transfers, cues for safety   Ambulation/Gait             General Gait Details: unable at baseline   Stairs            Wheelchair Mobility    Modified Rankin (Stroke Patients Only)       Balance Overall balance assessment: Needs assistance Sitting-balance support: Bilateral upper extremity supported;Feet supported Sitting balance-Leahy Scale: Fair     Standing balance support: During functional activity Standing balance-Leahy Scale: Poor Standing balance comment: reliant on external support                              Pertinent Vitals/Pain Pain Assessment: No/denies pain    Home Living Family/patient expects to be discharged to:: Private residence Living Arrangements: Children Available Help at Discharge: Family Type of Home: House Home Access: Stairs to enter Entrance Stairs-Rails: Right;Left;Can reach both Entrance Stairs-Number of Steps: 4 Home Layout: Two level;Able to live on main level with bedroom/bathroom Home Equipment: Gilford Rile - 2 wheels;Shower seat;Adaptive equipment;Hospital bed;Other (comment)      Prior Function Level of Independence: Needs assistance   Gait / Transfers Assistance Needed: Using RW for mobility. Non-ambulatory at baseline, does transfer with daughter.   ADL's / Homemaking Assistance Needed: Daughter assisting with shower transfers. Per pt she is able to complete dressing and bathing tasks  without physical assistance. Need to confirm with family.   Comments: patient no longer ambulatory     Hand Dominance        Extremity/Trunk Assessment        Lower Extremity Assessment Lower Extremity Assessment: Generalized weakness    Cervical /  Trunk Assessment Cervical / Trunk Assessment: Kyphotic  Communication   Communication: No difficulties  Cognition Arousal/Alertness: Awake/alert Behavior During Therapy: WFL for tasks assessed/performed Overall Cognitive Status: Within Functional Limits for tasks assessed                                        General Comments      Exercises     Assessment/Plan    PT Assessment Patient needs continued PT services  PT Problem List Decreased strength;Decreased mobility;Decreased safety awareness;Decreased coordination;Decreased balance       PT Treatment Interventions DME instruction;Therapeutic activities;Gait training;Therapeutic exercise;Patient/family education;Stair training;Balance training;Functional mobility training;Neuromuscular re-education    PT Goals (Current goals can be found in the Care Plan section)  Acute Rehab PT Goals Patient Stated Goal: to go home  PT Goal Formulation: With patient/family Time For Goal Achievement: 08/29/17 Potential to Achieve Goals: Good    Frequency Min 3X/week   Barriers to discharge        Co-evaluation               AM-PAC PT "6 Clicks" Daily Activity  Outcome Measure Difficulty turning over in bed (including adjusting bedclothes, sheets and blankets)?: Unable Difficulty moving from lying on back to sitting on the side of the bed? : Unable Difficulty sitting down on and standing up from a chair with arms (e.g., wheelchair, bedside commode, etc,.)?: Unable Help needed moving to and from a bed to chair (including a wheelchair)?: A Lot Help needed walking in hospital room?: Total Help needed climbing 3-5 steps with a railing? : Total 6 Click Score: 7    End of Session Equipment Utilized During Treatment: Gait belt Activity Tolerance: Patient tolerated treatment well Patient left: in chair;with chair alarm set;with call bell/phone within reach   PT Visit Diagnosis: Unsteadiness on feet (R26.81);Muscle  weakness (generalized) (M62.81);Other abnormalities of gait and mobility (R26.89)    Time: 6861-6837 PT Time Calculation (min) (ACUTE ONLY): 27 min   Charges:   PT Evaluation $PT Eval Moderate Complexity: 1 Mod PT Treatments $Therapeutic Activity: 8-22 mins   PT G Codes:        Deniece Ree PT, DPT, CBIS  Supplemental Physical Therapist [place]   Pager [phone]

## 2017-08-16 ENCOUNTER — Inpatient Hospital Stay (HOSPITAL_COMMUNITY)

## 2017-08-16 LAB — COMPREHENSIVE METABOLIC PANEL
ALT: 6 U/L — AB (ref 14–54)
ANION GAP: 7 (ref 5–15)
AST: 22 U/L (ref 15–41)
Albumin: 2 g/dL — ABNORMAL LOW (ref 3.5–5.0)
Alkaline Phosphatase: 43 U/L (ref 38–126)
BUN: 22 mg/dL — ABNORMAL HIGH (ref 6–20)
CHLORIDE: 112 mmol/L — AB (ref 101–111)
CO2: 18 mmol/L — AB (ref 22–32)
CREATININE: 1.06 mg/dL — AB (ref 0.44–1.00)
Calcium: 7.3 mg/dL — ABNORMAL LOW (ref 8.9–10.3)
GFR calc non Af Amer: 49 mL/min — ABNORMAL LOW (ref >60)
GFR, EST AFRICAN AMERICAN: 57 mL/min — AB (ref >60)
Glucose, Bld: 80 mg/dL (ref 65–99)
POTASSIUM: 4.7 mmol/L (ref 3.5–5.1)
SODIUM: 137 mmol/L (ref 135–145)
Total Bilirubin: 0.9 mg/dL (ref 0.3–1.2)
Total Protein: 4 g/dL — ABNORMAL LOW (ref 6.5–8.1)

## 2017-08-16 LAB — CBC WITH DIFFERENTIAL/PLATELET
Basophils Absolute: 0 10*3/uL (ref 0.0–0.1)
Basophils Relative: 0 %
Eosinophils Absolute: 0 10*3/uL (ref 0.0–0.7)
Eosinophils Relative: 0 %
HEMATOCRIT: 27.1 % — AB (ref 36.0–46.0)
HEMOGLOBIN: 8.9 g/dL — AB (ref 12.0–15.0)
LYMPHS ABS: 1.7 10*3/uL (ref 0.7–4.0)
LYMPHS PCT: 12 %
MCH: 30.8 pg (ref 26.0–34.0)
MCHC: 32.8 g/dL (ref 30.0–36.0)
MCV: 93.8 fL (ref 78.0–100.0)
MONOS PCT: 5 %
Monocytes Absolute: 0.8 10*3/uL (ref 0.1–1.0)
NEUTROS ABS: 11.6 10*3/uL — AB (ref 1.7–7.7)
NEUTROS PCT: 83 %
Platelets: 207 10*3/uL (ref 150–400)
RBC: 2.89 MIL/uL — ABNORMAL LOW (ref 3.87–5.11)
RDW: 15.7 % — ABNORMAL HIGH (ref 11.5–15.5)
WBC: 14 10*3/uL — ABNORMAL HIGH (ref 4.0–10.5)

## 2017-08-16 MED ORDER — CLOPIDOGREL BISULFATE 75 MG PO TABS
75.0000 mg | ORAL_TABLET | Freq: Every day | ORAL | Status: DC
  Administered 2017-08-16 – 2017-08-20 (×5): 75 mg via ORAL
  Filled 2017-08-16 (×5): qty 1

## 2017-08-16 MED ORDER — GUAIFENESIN ER 600 MG PO TB12
1200.0000 mg | ORAL_TABLET | Freq: Every day | ORAL | Status: DC
  Administered 2017-08-16 – 2017-08-20 (×5): 1200 mg via ORAL
  Filled 2017-08-16 (×5): qty 2

## 2017-08-16 MED ORDER — DILTIAZEM HCL ER COATED BEADS 240 MG PO CP24
240.0000 mg | ORAL_CAPSULE | Freq: Every morning | ORAL | Status: DC
  Administered 2017-08-16 – 2017-08-20 (×4): 240 mg via ORAL
  Filled 2017-08-16 (×5): qty 1

## 2017-08-16 MED ORDER — METHYLNALTREXONE BROMIDE 12 MG/0.6ML ~~LOC~~ SOLN
12.0000 mg | Freq: Once | SUBCUTANEOUS | Status: AC
  Administered 2017-08-16: 12 mg via SUBCUTANEOUS
  Filled 2017-08-16: qty 0.6

## 2017-08-16 MED ORDER — COLCHICINE 0.6 MG PO TABS
0.6000 mg | ORAL_TABLET | ORAL | Status: DC
Start: 2017-08-16 — End: 2017-08-16

## 2017-08-16 MED ORDER — PRAVASTATIN SODIUM 10 MG PO TABS
20.0000 mg | ORAL_TABLET | Freq: Every day | ORAL | Status: DC
  Administered 2017-08-16 – 2017-08-19 (×4): 20 mg via ORAL
  Filled 2017-08-16 (×4): qty 2

## 2017-08-16 MED ORDER — CHLORHEXIDINE GLUCONATE 0.12 % MT SOLN
15.0000 mL | Freq: Two times a day (BID) | OROMUCOSAL | Status: DC
  Administered 2017-08-16 – 2017-08-20 (×10): 15 mL via OROMUCOSAL
  Filled 2017-08-16 (×9): qty 15

## 2017-08-16 MED ORDER — COLCHICINE 0.6 MG PO TABS
0.6000 mg | ORAL_TABLET | Freq: Every day | ORAL | Status: DC
  Administered 2017-08-16 – 2017-08-19 (×4): 0.6 mg via ORAL
  Filled 2017-08-16 (×5): qty 1

## 2017-08-16 NOTE — Progress Notes (Signed)
[name]: Pt is doing well she states her abdomen feels much better and is much softer than before. She is wanting to eat real foods but when redirected why she remembers this and apologizes. She did get up to chair today and sat for about one hour.  She feels this has exhausted her. She was encouraged to rest a while and may be try to get up this evening if she feels like it. We will continue to follow and support pt in home when she is discharged. Please call if you have any questions. Thank you for your help with caring for Rebecca [name]. Webb Silversmith RN [phone]

## 2017-08-16 NOTE — Evaluation (Signed)
Occupational Therapy Evaluation Patient Details Name: Rebecca [name] MRN: [id number] DOB: 02/28/1941 Today's Date: 08/16/2017    History of Present Illness 77yo female with multiple medical problems, failure to thrive, dysphagia, aspiration pneumonia now brought to ED with nausea, brown emesis, chest discomfort, hypotension, elevated troponins. Diagonsed with ileus due to chronic constipation. PMH AKI, CAD, CKD, COPD, DM, heart failure, frozen shoulder, hypothyroidism, polymyalgia rheumatica, renal insufficiency, schizoaffective disorder, CVA, cardiac cath    Clinical Impression   PTA, pt had assistance from daughter for functional transfers and ADL. She is able to assist with UB ADL and self-feeding tasks. Pt required max assist for stand-pivot transfer to recliner chair this session for simulated toilet transfers and total assistance for LB ADL. Pt very engaged in session and motivated to improve her functionality prior to D/C back home with daughter and continued hospice services. She would benefit from acute OT to maximize activity tolerance for ADL while admitted.    Follow Up Recommendations  No OT follow up;Supervision/Assistance - 24 hour(continued hospice care)    Equipment Recommendations  None recommended by OT(has needs met)    Recommendations for Other Services       Precautions / Restrictions Precautions Precautions: Fall;Other (comment) Precaution Comments: non-ambulatory at baseline  Restrictions Weight Bearing Restrictions: No      Mobility Bed Mobility Overal bed mobility: Needs Assistance Bed Mobility: Supine to Sit     Supine to sit: Min assist     General bed mobility comments: min assist to power up trunk  Transfers Overall transfer level: Needs assistance Equipment used: 1 person hand held assist Transfers: Sit to/from [employer] Transfers Sit to Stand: Mod assist Stand pivot transfers: Mod assist       General transfer comment: ModA for all  functional transfers, cues for safety     Balance Overall balance assessment: Needs assistance Sitting-balance support: Bilateral upper extremity supported;Feet supported Sitting balance-Leahy Scale: Fair     Standing balance support: During functional activity Standing balance-Leahy Scale: Poor Standing balance comment: reliant on external support                            ADL either performed or assessed with clinical judgement   ADL Overall ADL's : Needs assistance/impaired Eating/Feeding: Minimal assistance;Sitting   Grooming: Minimal assistance;Sitting   Upper Body Bathing: Minimal assistance;Sitting   Lower Body Bathing: Maximal assistance;Sitting/lateral leans   Upper Body Dressing : Minimal assistance;Sitting   Lower Body Dressing: Maximal assistance;Sit to/from stand   Toilet Transfer: Maximal assistance;Stand-pivot   Toileting- Clothing Manipulation and Hygiene: Total assistance;Sit to/from stand       Functional mobility during ADLs: Maximal assistance       Vision Patient Visual Report: No change from baseline Additional Comments: no apparent deficits but need to further assess     Perception     Praxis      Pertinent Vitals/Pain Pain Assessment: Faces Faces Pain Scale: Hurts little more Pain Location: B LE Pain Descriptors / Indicators: Aching;Sore Pain Intervention(s): Limited activity within patient's tolerance;Monitored during session;Repositioned     Hand Dominance     Extremity/Trunk Assessment Upper Extremity Assessment Upper Extremity Assessment: Generalized weakness(bruising both arms)   Lower Extremity Assessment Lower Extremity Assessment: Generalized weakness       Communication Communication Communication: No difficulties   Cognition Arousal/Alertness: Awake/alert Behavior During Therapy: WFL for tasks assessed/performed Overall Cognitive Status: Within Functional Limits for tasks assessed  General Comments       Exercises     Shoulder Instructions      Home Living Family/patient expects to be discharged to:: Private residence Living Arrangements: Children Available Help at Discharge: Family;Available 24 hours/day(hospice nurse) Type of Home: House Home Access: Stairs to enter [employer] of Steps: 4 Entrance Stairs-Rails: Right;Left;Can reach both Home Layout: Two level;Able to live on main level with bedroom/bathroom     Bathroom Shower/Tub: Occupational psychologist: Standard     Home Equipment: Environmental consultant - 2 wheels;Shower seat;Adaptive equipment;Hospital bed;Other (comment)          Prior Functioning/Environment Level of Independence: Needs assistance  Gait / Transfers Assistance Needed: Daughter assists with transfers with face to face technique. Non-ambulatory at baseline. Daughter assists with transfers.  ADL's / Homemaking Assistance Needed: Daughter assists with dressing and bathing. Does not shower and does bed baths   Comments: patient no longer ambulatory        OT Problem List: Decreased strength;Decreased range of motion;Decreased activity tolerance;Impaired balance (sitting and/or standing);Decreased safety awareness;Decreased knowledge of use of DME or AE;Decreased knowledge of precautions;Pain;Impaired UE functional use;Decreased cognition;Decreased coordination      OT Treatment/Interventions: Self-care/ADL training;Therapeutic exercise;Energy conservation;DME and/or AE instruction;Therapeutic activities;Patient/family education;Balance training    OT Goals(Current goals can be found in the care plan section) Acute Rehab OT Goals Patient Stated Goal: to go home  OT Goal Formulation: With patient Time For Goal Achievement: 08/30/17 Potential to Achieve Goals: Good ADL Goals Pt Will Perform Grooming: with set-up;sitting Pt Will Perform Upper Body Bathing: with min assist;sitting Pt Will  Transfer to Toilet: stand pivot transfer;with min assist;bedside commode Pt Will Perform Toileting - Clothing Manipulation and hygiene: with mod assist;sitting/lateral leans Pt/caregiver will Perform Home Exercise Program: Increased strength;Both right and left upper extremity;With written HEP provided;With Supervision  OT Frequency: Min 2X/week   Barriers to D/C:            Co-evaluation              AM-PAC PT "6 Clicks" Daily Activity     Outcome Measure Help from another person eating meals?: A Little Help from another person taking care of personal grooming?: A Little Help from another person toileting, which includes using toliet, bedpan, or urinal?: A Lot Help from another person bathing (including washing, rinsing, drying)?: A Lot Help from another person to put on and taking off regular upper body clothing?: A Little Help from another person to put on and taking off regular lower body clothing?: A Lot 6 Click Score: 15   End of Session Equipment Utilized During Treatment: Gait belt Nurse Communication: Mobility status  Activity Tolerance: Patient tolerated treatment well Patient left: in chair;with chair alarm set;with family/visitor present  OT Visit Diagnosis: Other abnormalities of gait and mobility (R26.89);Muscle weakness (generalized) (M62.81)                Time: 1975-8832 OT Time Calculation (min): 29 min Charges:  OT General Charges $OT Visit: 1 Visit OT Evaluation $OT Eval Moderate Complexity: 1 Mod OT Treatments $Self Care/Home Management : 8-22 mins G-Codes:     Norman Herrlich, MS OTR/L  Pager: [place] A Andree Heeg 08/16/2017, 12:23 PM

## 2017-08-16 NOTE — Evaluation (Signed)
Clinical/Bedside Swallow Evaluation Patient Details  Name: Rebecca [name] MRN: [id number] Date of Birth: 1940/08/19  Today's Date: 08/16/2017 Time: SLP Start Time (ACUTE ONLY): 1443 SLP Stop Time (ACUTE ONLY): 1515 SLP Time Calculation (min) (ACUTE ONLY): 32 min  Past Medical History:  Past Medical History:  Diagnosis Date  . AKI (acute kidney injury) ([place])   . Anemia in chronic renal disease   . Aortic atherosclerosis ([place]) 01/27/2017  . CAD (coronary artery disease) 2006   a. s/p DES to RCA in 2006 b. low-risk NST in 2014 c. cath in 08/2015 showing patent RCA stent with nonobstructive disease  . CKD (chronic kidney disease) stage 4, GFR 15-29 ml/min (H[facility])   . Complication of anesthesia   . COPD (chronic obstructive pulmonary disease) ([place])   . Diabetes mellitus   . Diastolic heart failure, NYHA class 1 ([place])   . Dyslipidemia   . Dysuria 01/06/2015  . Frozen shoulder    right  . Gastroesophageal reflux   . HAP (hospital-acquired pneumonia)   . Headache   . Hyperkalemia 01/01/2016  . Hypothyroidism   . 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 Obesity   . Pancreatic mass 01/27/2017  . Pneumonia 06/2016  . Polymyalgia rheumatica ([place])   . PONV (postoperative nausea and vomiting)   . Renal insufficiency   . Respiratory distress   . Schizoaffective disorder ([place])   . Sepsis due to pneumonia ([place])   . Shortness of breath   . Stroke [facility])    Past Surgical History:  Past Surgical History:  Procedure Laterality Date  . ABDOMINAL HYSTERECTOMY    . CARDIAC CATHETERIZATION N/A 08/13/2015   Procedure: Left Heart Cath and Coronary Angiography;  Surgeon: Jettie Booze, MD;  Location: [facility];  Service: Cardiovascular;  Laterality: N/A;  . CATARACT EXTRACTION    . CHOLECYSTECTOMY    . CORONARY ANGIOPLASTY WITH STENT PLACEMENT  2006   Taxus DES to RCA, 50 % residual  . PARTIAL HYSTERECTOMY  1995   HPI:  77yo female with multiple medical  problems, failure to thrive, dysphagia, aspiration pneumonia now brought to ED with nausea, brown emesis, chest discomfort, hypotension, elevated troponins. Diagonsed with ileus due to chronic constipation. PMH AKI, CAD, CKD, COPD, DM, heart failure, frozen shoulder, hypothyroidism, polymyalgia rheumatica, renal insufficiency, schizoaffective disorder, CVA, cardiac cath. CXR Increased left basilar opacity is noted concerning for worsening atelectasis or infiltrate with mild associated pleural effusion. MBS 05/03/17 recommended puree and honey thick liquids, esophagus 80% full.     Assessment / Plan / Recommendation Clinical Impression  This therapist familiar with pt from prior admission 04/2017. She is at chronic high aspiration risk from history of CVA, esophageal dysphagia and general deconditioning. Pt on nectar thick liquids, puree texture and thin water protocol prior to [place] (at home with daughter). Immediate cough following thin water from [place] cup (limits single sip sizes) with initial sip. No cough from subsequent sips nor from consumption of jello. Recommend continue clear liquids (per MD d/t ileus) with all liquids thickened to nectar thick following aspiration precautions. Will follow up for continued education.      SLP Visit Diagnosis: Dysphagia, pharyngoesophageal phase (R13.14)    Aspiration Risk  Moderate aspiration risk;Severe aspiration risk    Diet Recommendation Nectar-thick liquid(clear liquids thickened to nectar)   Liquid Administration via: Cup Medication Administration: Whole meds with puree Supervision: Patient able to self feed;Full supervision/cueing for compensatory strategies Compensations: Slow rate;Small sips/bites;Minimize environmental distractions;Clear throat intermittently;Multiple  dry swallows after each bite/sip Postural Changes: Seated upright at 90 degrees    Other  Recommendations Oral Care Recommendations: Oral care BID   Follow up  Recommendations None      Frequency and Duration min 1 x/week  2 weeks       Prognosis Prognosis for Safe Diet Advancement: Fair      Swallow Study   General HPI: 77yo female with multiple medical problems, failure to thrive, dysphagia, aspiration pneumonia now brought to ED with nausea, brown emesis, chest discomfort, hypotension, elevated troponins. Diagonsed with ileus due to chronic constipation. PMH AKI, CAD, CKD, COPD, DM, heart failure, frozen shoulder, hypothyroidism, polymyalgia rheumatica, renal insufficiency, schizoaffective disorder, CVA, cardiac cath. CXR Increased left basilar opacity is noted concerning for worsening atelectasis or infiltrate with mild associated pleural effusion. MBS 05/03/17 recommended puree and honey thick liquids, esophagus 80% full.   Type of Study: Bedside Swallow Evaluation Previous Swallow Assessment: (see HPI) Diet Prior to this Study: Nectar-thick liquids(clear liquids-nectar) Temperature Spikes Noted: No Respiratory Status: Room air History of Recent Intubation: No Behavior/Cognition: Alert;Cooperative;Pleasant mood;Requires cueing Oral Cavity Assessment: Within Functional Limits Oral Care Completed by SLP: No Oral Cavity - Dentition: Dentures, top;Dentures, bottom Vision: Functional for self-feeding Self-Feeding Abilities: Able to feed self Patient Positioning: Upright in bed Baseline Vocal Quality: Normal Volitional Cough: Strong Volitional Swallow: Able to elicit    Oral/Motor/Sensory Function Overall Oral Motor/Sensory Function: Within functional limits   Ice Chips Ice chips: Not tested   Thin Liquid Thin Liquid: Impaired Presentation: Cup Pharyngeal  Phase Impairments: Cough - Immediate    Nectar Thick Nectar Thick Liquid: Within functional limits Presentation: Cup   Honey Thick Honey Thick Liquid: Not tested   Puree Puree: Not tested   Solid   GO   Solid: Not tested        Rebecca [name] 08/16/2017,4:05  PM    Rebecca [name].Ed [employer] [phone]

## 2017-08-16 NOTE — Progress Notes (Signed)
1700 a small skin tear noted to left shin after transferring the pt to bed with the use of Stedy. Foam dressing applied.

## 2017-08-16 NOTE — Progress Notes (Signed)
Physical Therapy Treatment Patient Details Name: Rebecca [name] MRN: [id number] DOB: 03-18-1941 Today's Date: 08/16/2017    History of Present Illness 77yo female with multiple medical problems, failure to thrive, dysphagia, aspiration pneumonia now brought to ED with nausea, brown emesis, chest discomfort, hypotension, elevated troponins. Diagonsed with ileus due to chronic constipation. PMH AKI, CAD, CKD, COPD, DM, heart failure, frozen shoulder, hypothyroidism, polymyalgia rheumatica, renal insufficiency, schizoaffective disorder, CVA, cardiac cath     PT Comments    Patient agreeable to participate in therapy and daughter present throughout and actively participated. Pt required min/mod A using Stedy for transfer EOB to recliner. Pt and daughter would benefit from [place] (or standing frame) at home for safer transfers however not sure if this is available through hospice. Focus on functional transfers and caregiver education next session.   Follow Up Recommendations  Home health PT;Other (comment)(as able with hospice in the picture )     Equipment Recommendations  Other (comment)(Stedy-not sure if this is possible through hospice/insurance)    Recommendations for Other Services       Precautions / Restrictions Precautions Precautions: Fall;Other (comment) Precaution Comments: non-ambulatory at baseline  Restrictions Weight Bearing Restrictions: No    Mobility  Bed Mobility Overal bed mobility: Needs Assistance Bed Mobility: Supine to Sit     Supine to sit: Min assist     General bed mobility comments: min A to bring LE over EOB and to elevate trunk; use of rail and HOB elevated; pt has hospital bed and trapeze at home  Transfers Overall transfer level: Needs assistance   Transfers: Sit to/from Stand;Stand Pivot Transfers Sit to Stand: Mod assist;Min assist         General transfer comment: assist to power up into standing with facilitation at hips to achieve more  upright posture; cues for hand placement and safety  Ambulation/Gait             General Gait Details: unable at baseline    Stairs            Wheelchair Mobility    Modified Rankin (Stroke Patients Only)       Balance Overall balance assessment: Needs assistance Sitting-balance support: Bilateral upper extremity supported;Feet supported Sitting balance-Leahy Scale: Fair     Standing balance support: During functional activity Standing balance-Leahy Scale: Poor Standing balance comment: reliant on external support                             Cognition Arousal/Alertness: Awake/alert Behavior During Therapy: WFL for tasks assessed/performed Overall Cognitive Status: Within Functional Limits for tasks assessed                                        Exercises General Exercises - Lower Extremity Ankle Circles/Pumps: AROM;Both;10 reps Long Arc Quad: AROM;Both;10 reps;Seated Hip Flexion/Marching: AROM;Both;10 reps;Seated    General Comments General comments (skin integrity, edema, etc.): daughter present throughout session; pt given HEP handout      Pertinent Vitals/Pain Pain Assessment: Faces Faces Pain Scale: Hurts little more Pain Location: B LE Pain Descriptors / Indicators: Sore Pain Intervention(s): Limited activity within patient's tolerance;Monitored during session;Premedicated before session;Repositioned    Home Living                      Prior Function  PT Goals (current goals can now be found in the care plan section) Acute Rehab PT Goals Patient Stated Goal: to go home  PT Goal Formulation: With patient/family Time For Goal Achievement: 08/29/17 Potential to Achieve Goals: Good Progress towards PT goals: Progressing toward goals    Frequency    Min 3X/week      PT Plan Current plan remains appropriate    Co-evaluation              AM-PAC PT "6 Clicks" Daily Activity   Outcome Measure  Difficulty turning over in bed (including adjusting bedclothes, sheets and blankets)?: Unable Difficulty moving from lying on back to sitting on the side of the bed? : Unable Difficulty sitting down on and standing up from a chair with arms (e.g., wheelchair, bedside commode, etc,.)?: Unable Help needed moving to and from a bed to chair (including a wheelchair)?: A Lot Help needed walking in hospital room?: Total Help needed climbing 3-5 steps with a railing? : Total 6 Click Score: 7    End of Session Equipment Utilized During Treatment: Gait belt Activity Tolerance: Patient tolerated treatment well Patient left: in chair;with chair alarm set;with call bell/phone within reach;with family/visitor present   PT Visit Diagnosis: Unsteadiness on feet (R26.81);Muscle weakness (generalized) (M62.81);Other abnormalities of gait and mobility (R26.89)     Time: 7262-0355 PT Time Calculation (min) (ACUTE ONLY): 35 min  Charges:  $Therapeutic Activity: 23-37 mins                    G Codes:       Earney Navy, PTA Pager: [phone]     Darliss Cheney 08/16/2017, 4:53 PM

## 2017-08-16 NOTE — Progress Notes (Signed)
PROGRESS NOTE    Rebecca [name]  CXK:[id number] DOB: [birth year] DOA: 08/18/2017 PCP: [name], MD     Brief Narrative: (  77 year old with past medical history relevant for coronary artery disease status post DES to RCA, MGUS, polymyalgia rheumatica, COPD, hyperlipidemia, chronic diastolic heart failure, chronic kidney disease, anemia of chronic disease, history of stroke, hypothyroidism, schizoaffective disorder who was recently transitioned in November 2018 to hospice due to recurrent aspiration pneumonia from stroke who presents with ileus and possible UTI.   Assessment & Plan:   Principal Problem:   Ileus ([place]) Active Problems:   Constipation, chronic   Abnormal urinalysis   Dehydration   Elevated troponin   Diastolic heart failure, NYHA class 1 (HCC)   Hypothyroidism, adult   Current chronic use of systemic steroids   COPD (chronic obstructive pulmonary disease) (HCC)   CAD (coronary artery disease)   CKD (chronic kidney disease), stage IV (HCC)   #) Ileus: Likely secondary to multiple causes including constipation, institutional colon, chronic opioid use in the setting of end of life care. -Continue stool softeners and polyethylene glycol -Consider enema if patient does not have bowel movement - Start methylnaltrexone as adjunctive -Discontinue IV fluids at this time as patient appears to have some volume overload, will reassess every day and consider restarting  -advance diet as tolerated, patient is tolerating some clears  #) Hospice: Do a multitude of factors including her inability to take p.o., coronary artery disease -Continue oral morphine 5 mg every 3 hours as needed  #) Possible UTI: Patient was admitted with leukocytosis and elevated lactate concerning for sepsis syndrome.  Urine grew out E. coli that was sensitive to ceftriaxone. -Continue cephalexin -Blood cultures no growth to date  #) Coronary disease with elevated troponin: Patient is daughter does  not want any further workup done in the setting. -Continue Toprol 75 mg -Continue pravastatin  #) PMR/COPD: Patient is on chronic steroids at home - Continue methylprednisolone 40 mg daily -Continue Dulera 2 puffs daily  #) Hypothyroidism: -Continue levothyroxine 37.5 mcg IV  #) Hypertension: -Continue diltiazem 240 daily  Fluids: Hold IV fluids at this time Electrolyte: Monitor and support Nutrition: Clear diet, advance diet as tolerated  Prophylaxis: Enoxaparin  Disposition: Pending ability to tolerate p.o.  Do not resuscitate  Consultants:   None  Procedures: (Don't include imaging studies which can be auto populated. Include things that cannot be auto populated i.e. Echo, Carotid and venous dopplers, Foley, Bipap, HD, tubes/drains, wound vac, central lines etc)  None  Antimicrobials: (specify start and planned stop date. Auto populated tables are space occupying and do not give end dates)  Cephalexin   Subjective: Patient's daughter reports that she is doing well.  She continues to be quite concerned because her mother has not had a bowel movement yet.  Patient reports only some pain over her epigastric area that is fairly mild.  She has been able to tolerate some p.o.  Objective: Vitals:   08/15/17 1524 08/15/17 2117 08/16/17 0435 08/16/17 0838  BP: 128/77 129/72 128/60   Pulse: 91 91 100   Resp: 20 16 16    Temp: 97.9 F (36.6 C) 97.7 F (36.5 C) 97.9 F (36.6 C)   TempSrc: Oral Oral    SpO2: 97% 100% 94% 93%  Weight:      Height:        Intake/Output Summary (Last 24 hours) at 08/16/2017 1039 Last data filed at 08/16/2017 0436 Gross per 24 hour  Intake 783  ml  Output 875 ml  Net -92 ml   Filed Weights   09/06/2017 0654  Weight: 63.5 kg (140 lb)    Examination:  General exam: Appears calm and comfortable  Respiratory system: Intermittent wheezing, scattered rhonchi Cardiovascular system: S1 & S2 heard, RRR.  No murmur Gastrointestinal system:  Mildly distended, diminished bowel sounds, mild epigastric tenderness, no rebound or guarding Central nervous system: Alert and oriented. No focal neurological deficits. Extremities: Trace lower extremity edema Skin: No rashes on visible skin Psychiatry: Judgement and insight appear normal. Mood & affect appropriate.     Data Reviewed: I have personally reviewed following labs and imaging studies  CBC: Recent Labs  Lab 09/08/2017 0704 08/17/2017 2231 08/14/17 0645 08/16/17 0624  WBC 21.2* 14.6* 22.0* 14.0*  NEUTROABS  --  13.6*  --  11.6*  HGB 11.3* 8.1* 8.2* 8.9*  HCT 34.8* 24.5* 24.7* 27.1*  MCV 93.5 93.5 93.6 93.8  PLT 299 150 184 347   Basic Metabolic Panel: Recent Labs  Lab 09/08/2017 0704 08/14/17 0645 08/16/17 0624  NA 140 137 137  K 3.9 4.4 4.7  CL 103 110 112*  CO2 21* 19* 18*  GLUCOSE 118* 94 80  BUN 26* 24* 22*  CREATININE 1.75* 1.27* 1.06*  CALCIUM 9.7 7.6* 7.3*   GFR: Estimated Creatinine Clearance: 39.2 mL/min (A) (by C-G formula based on SCr of 1.06 mg/dL (H)). Liver Function Tests: Recent Labs  Lab 09/02/2017 0704 08/16/17 0624  AST 23 22  ALT 11* 6*  ALKPHOS 55 43  BILITOT 0.8 0.9  PROT 5.1* 4.0*  ALBUMIN 2.4* 2.0*   Recent Labs  Lab 08/23/2017 0704  LIPASE 34   No results for input(s): AMMONIA in the last 168 hours. Coagulation Profile: Recent Labs  Lab 08/25/2017 0704  INR 0.91   Cardiac Enzymes: No results for input(s): CKTOTAL, [name], [name], TROPONINI in the last 168 hours. BNP (last 3 results) Recent Labs    03/29/17 1646  PROBNP 84.0   HbA1C: No results for input(s): HGBA1C in the last 72 hours. CBG: No results for input(s): GLUCAP in the last 168 hours. Lipid Profile: No results for input(s): CHOL, [name], [name], [name], [name], LDLDIRECT in the last 72 hours. Thyroid Function Tests: No results for input(s): TSH, T4TOTAL, FREET4, T3FREE, THYROIDAB in the last 72 hours. Anemia Panel: No results for input(s): VITAMINB12,  FOLATE, FERRITIN, TIBC, IRON, RETICCTPCT in the last 72 hours. Sepsis Labs: Recent Labs  Lab 08/31/2017 0716 08/23/2017 1006  LATICACIDVEN 2.39* 0.92    Recent Results (from the past 240 hour(s))  Blood Culture (routine x 2)     Status: None (Preliminary result)   Collection Time: 08/15/2017  8:22 AM  Result Value Ref Range Status   Specimen Description BLOOD RIGHT ANTECUBITAL  Final   Special Requests   Final    BOTTLES DRAWN AEROBIC AND ANAEROBIC Blood Culture adequate volume   Culture   Final    NO GROWTH 2 DAYS Performed at [facility], [address] [place], [place] [address]    Report Status PENDING  Incomplete  Blood Culture (routine x 2)     Status: None (Preliminary result)   Collection Time: 09/07/2017  8:23 AM  Result Value Ref Range Status   Specimen Description BLOOD LEFT FOREARM  Final   Special Requests   Final    BOTTLES DRAWN AEROBIC AND ANAEROBIC Blood Culture adequate volume   Culture   Final    NO GROWTH 2 DAYS Performed  at [facility], [place] [address] [place], [place] [address]    Report Status PENDING  Incomplete  Culture, Urine     Status: Abnormal   Collection Time: 09/10/2017  2:30 PM  Result Value Ref Range Status   Specimen Description URINE, RANDOM  Final   Special Requests   Final    NONE Performed at [facility], [place] [address] [place], [place] [address]    Culture >=100,000 COLONIES/mL ESCHERICHIA COLI (A)  Final   Report Status 08/15/2017 FINAL  Final   Organism ID, Bacteria ESCHERICHIA COLI (A)  Final      Susceptibility   Escherichia coli - MIC*    AMPICILLIN >=32 RESISTANT Resistant     CEFAZOLIN <=4 SENSITIVE Sensitive     CEFTRIAXONE <=1 SENSITIVE Sensitive     CIPROFLOXACIN >=4 RESISTANT Resistant     GENTAMICIN >=16 RESISTANT Resistant     IMIPENEM <=0.25 SENSITIVE Sensitive     NITROFURANTOIN 256 RESISTANT Resistant     TRIMETH/SULFA >=320 RESISTANT Resistant     AMPICILLIN/SULBACTAM 4 SENSITIVE Sensitive      PIP/TAZO <=4 SENSITIVE Sensitive     Extended ESBL NEGATIVE Sensitive     * >=100,000 COLONIES/mL ESCHERICHIA COLI         Radiology Studies: No results found.      Scheduled Meds: . cephALEXin  500 mg Oral Q12H  . cloZAPine  300 mg Oral QHS  . enoxaparin (LOVENOX) injection  30 mg Subcutaneous Q24H  . ipratropium-albuterol  3 mL Inhalation BID  . levothyroxine  37.5 mcg Intravenous QAC breakfast  . methylPREDNISolone (SOLU-MEDROL) injection  40 mg Intravenous Daily  . mometasone-formoterol  2 puff Inhalation BID  . pantoprazole (PROTONIX) IV  40 mg Intravenous Q24H  . polyethylene glycol  17 g Oral BID  . senna-docusate  1 tablet Oral BID  . sodium chloride flush  3 mL Intravenous Q12H  . tamsulosin  0.4 mg Oral QPC supper  . tiotropium  18 mcg Inhalation Daily   Continuous Infusions:   LOS: 3 days    Time spent: [place], MD [facility]  If 7PM-7AM, please contact night-coverage [email or web address] Password [facility] 08/16/2017, 10:39 AM

## 2017-08-16 NOTE — Progress Notes (Signed)
Palliative Medicine RN Note: Update from Cheri with [facility] today in rounds. No need for PMT involvement at this time. Will sign off. Please reconsult if new needs arise.  Marjie Skiff Raphaela Cannaday, RN, BSN, [facility] Palliative Medicine Team 08/16/2017 2:54 PM Office [phone]

## 2017-08-17 LAB — BASIC METABOLIC PANEL WITH GFR
CO2: 19 mmol/L — ABNORMAL LOW (ref 22–32)
GFR calc non Af Amer: 53 mL/min — ABNORMAL LOW (ref >60)
Glucose, Bld: 87 mg/dL (ref 65–99)
Potassium: 4.3 mmol/L (ref 3.5–5.1)

## 2017-08-17 LAB — BASIC METABOLIC PANEL
Anion gap: 6 (ref 5–15)
BUN: 19 mg/dL (ref 6–20)
Calcium: 7.3 mg/dL — ABNORMAL LOW (ref 8.9–10.3)
Chloride: 111 mmol/L (ref 101–111)
Creatinine, Ser: 1 mg/dL (ref 0.44–1.00)
GFR calc Af Amer: >60 mL/min (ref >60)
Sodium: 136 mmol/L (ref 135–145)

## 2017-08-17 LAB — CBC
HCT: 28.1 % — ABNORMAL LOW (ref 36.0–46.0)
Hemoglobin: 9.1 g/dL — ABNORMAL LOW (ref 12.0–15.0)
MCH: 29.6 pg (ref 26.0–34.0)
MCHC: 32.4 g/dL (ref 30.0–36.0)
MCV: 91.5 fL (ref 78.0–100.0)
Platelets: 213 K/uL (ref 150–400)
RBC: 3.07 MIL/uL — ABNORMAL LOW (ref 3.87–5.11)
RDW: 15.6 % — ABNORMAL HIGH (ref 11.5–15.5)
WBC: 25.6 10*3/uL — ABNORMAL HIGH (ref 4.0–10.5)

## 2017-08-17 LAB — MAGNESIUM: Magnesium: 1.6 mg/dL — ABNORMAL LOW (ref 1.7–2.4)

## 2017-08-17 MED ORDER — PANTOPRAZOLE SODIUM 40 MG PO TBEC
40.0000 mg | DELAYED_RELEASE_TABLET | Freq: Every day | ORAL | Status: DC
  Administered 2017-08-17 – 2017-08-19 (×3): 40 mg via ORAL
  Filled 2017-08-17 (×3): qty 1

## 2017-08-17 MED ORDER — SENNOSIDES-DOCUSATE SODIUM 8.6-50 MG PO TABS
2.0000 | ORAL_TABLET | Freq: Two times a day (BID) | ORAL | Status: DC
  Administered 2017-08-17 – 2017-08-20 (×6): 2 via ORAL
  Filled 2017-08-17 (×6): qty 2

## 2017-08-17 MED ORDER — NYSTATIN 100000 UNIT/ML MT SUSP: 5.0000 mL | Freq: Four times a day (QID) | OROMUCOSAL | Status: DC

## 2017-08-17 MED ORDER — FLUCONAZOLE 50 MG PO TABS
50.0000 mg | ORAL_TABLET | Freq: Every day | ORAL | Status: DC
  Administered 2017-08-17 – 2017-08-19 (×3): 50 mg via ORAL
  Filled 2017-08-17 (×4): qty 1

## 2017-08-17 MED ORDER — PREDNISONE 10 MG PO TABS
10.0000 mg | ORAL_TABLET | Freq: Every day | ORAL | Status: DC
  Administered 2017-08-18 – 2017-08-20 (×3): 10 mg via ORAL
  Filled 2017-08-17 (×3): qty 1

## 2017-08-17 NOTE — Progress Notes (Signed)
OT Cancellation Note  Patient Details Name: Rebecca [name] MRN: [id number] DOB: 01-Jul-1940   Cancelled Treatment:    Reason Eval/Treat Not Completed: Patient declined, no reason specified. Pt declining to work with OT at this time, reports she is tired and just ready to go home and be more comfortable.   Binnie Kand M.S., OTR/L Pager: [phone]  08/17/2017, 2:45 PM

## 2017-08-17 NOTE — Progress Notes (Signed)
[facility] [place]:  Pt is active with Hospice services at home. She receives Therapist, sports, MSW, [place], and [employer].  She has equipment in home of hospital bed, Alternating air pressure mattress, oxygen, nebulizer, BSC, rolling walker and rollator.  We will continue to follow as she continues her stay here and will plan to assist at discharge with continuation of hospice care at home. That you for assisting with the care of this pt. Webb Silversmith RN   [phone]

## 2017-08-17 NOTE — Progress Notes (Addendum)
Pt has white patches on her tongue, her daughter is very concerned since the pt had it from the past that became worst. Messages sent to Dr Chauncey Cruel. [place]. Sent 4 pages but no response.

## 2017-08-17 NOTE — Progress Notes (Addendum)
This rn was called in the pt room by the phlebotomist this am, pt have a skin tear on her right arm (AC area) when phlebotomist trying to get bandage off pt's arm. RN cleaned the skin tear and pt pink foam on. Will continue to monitor pt.

## 2017-08-17 NOTE — Care Management Important Message (Signed)
Important Message  Patient Details  Name: Rebecca [name] MRN: [id number] Date of Birth: May 07, 1941   Medicare Important Message Given:  Yes    Orbie Pyo 08/17/2017, 2:26 PM

## 2017-08-17 NOTE — Progress Notes (Addendum)
PROGRESS NOTE  Rebecca [name] PNT:[id number] DOB: May 01, 1941 DOA: 08/30/2017 PCP: [name], MD  HPI/Recap of past 24 hours:  Rebecca [name] is a 77 y.o. year old female with medical history significant for COPD, PMR, CAD, CVA, mild esophageal dysmotility, recently transition to home with hospice (04/2017) given guarded prognosis of prior comorbidities who presented on 08/29/2017 with chest pain, N/V and was found to have constipation related to ileus.    Interval History Passed flatus in the past 24 hours, improved abdominal pain.  Tolerating liquid diet all per daughter's report.  Assessment/Plan: Principal Problem:   Ileus (HCC) Active Problems:   Constipation, chronic   Abnormal urinalysis   Dehydration   Elevated troponin   Diastolic heart failure, NYHA class 1 (HCC)   Hypothyroidism, adult   Current chronic use of systemic steroids   COPD (chronic obstructive pulmonary disease) (HCC)   CAD (coronary artery disease)   CKD (chronic kidney disease), stage IV (HCC)   #Constipation secondary to ileus In setting of chronic opioid use CT abdomen and abdominal x-ray consistent with ileus, no signs of bowel obstruction Passing flatus, still awaiting bowel movement -Increase senna docusate 2 tabs twice daily, continue MiraLAX, low threshold to start suppository - Patient does not tolerate enemas well - Serial abdominal exams  #UTI secondary to E. 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 Baseline diet pure (thin liquids per swallow function study 04/2017) Multiple episodes of aspiration pneumonia Chronic steroid use puts her at risk for oral candidiasis (concern for recurrence given new patches), family has declined EGD in the past the patient responded well to empiric Diflucan at that time Chest x-ray shows right opacity consistent with aspiration -Start empiric Diflucan (200 mg loading then 100 mg qd x 7 days or until  improves) -DC IV steroids, resume oral prednisone -Aspiration precautions  #Elevated troponin on admission #CAD/HLD Troponin elevated to 0.19 on admission Daughter declined further workup -Continue home plavix,Toprol and pravastatin  #Polymyalgia rheumatica -IV prednisolone currently -Plan to de-escalate to oral prednisone  #COPD No wheezing Chest x-ray shows right-sided opacity concerning for aspiration -Dulera daily, spiriva, duonebs BID -morphine PRN SOB  #CHFpEF, euvolemic -Continue to monitor volume status EF  #CKD, stage IV Creatinine actually improved at 1 ( baseline 1.6-1.7)  #Anemia of chronic kidney disease, stable -continue to monitor  #Hypothyroidism, stable Levothyroxine  #Hypertension, improved -Diltiazem  #Schizoaffective d/s -home clozapine  Code Status: DNR  Family Communication: Daughter at bedside  Disposition Plan: Improvement in bowel regimen (awaiting BM), improvement in diet   Consultants:  None  Procedures:  None  Antimicrobials:  3/2-3/3: Ceftriaxone  3/2 vancomycin and Zosyn  Keflex, 3/4--  Cultures:  3/2: Urine-E. coli (ampicillin, nitrofurantoin, Bactrim, ciprofloxacin and gentamicin resistant)  3/2: Blood cultures negative growth to date  Telemetry:  DVT prophylaxis: Lovenox   Objective: Vitals:   08/17/17 0928 08/17/17 0931 08/17/17 1037 08/17/17 1350  BP:   (!) 102/55 (!) 124/52  Pulse:   (!) 105 (!) 105  Resp:    15  Temp:    98.1 F (36.7 C)  TempSrc:    Oral  SpO2: 91% 91% 99% 95%  Weight:      Height:        Intake/Output Summary (Last 24 hours) at 08/17/2017 1923 Last data filed at 08/17/2017 1057 Gross per 24 hour  Intake 130 ml  Output 350 ml  Net -220 ml   [employer]  08/17/2017 0654  Weight: 63.5 kg (140 lb)    Exam:  General: ill appearing woman lying in bed,no apparent distres Cardiovascular: regular rate and rhythm, no murmurs, rubs or gallops, no edema Respiratory: Normal  respiratory effort on room air, lungs clear to auscultation bilaterally on anterior chest Abdomen: soft, non-distended, non-tender, minimal bowel sounds Skin: diffuse bruises on upper arms and legs ( from areas of IV lines)  Data Reviewed: CBC: Recent Labs  Lab 09/09/2017 0704 08/17/2017 2231 08/14/17 0645 08/16/17 0624 08/17/17 0623  WBC 21.2* 14.6* 22.0* 14.0* 25.6*  NEUTROABS  --  13.6*  --  11.6*  --   HGB 11.3* 8.1* 8.2* 8.9* 9.1*  HCT 34.8* 24.5* 24.7* 27.1* 28.1*  MCV 93.5 93.5 93.6 93.8 91.5  PLT 299 150 184 207 828   Basic Metabolic Panel: Recent Labs  Lab 08/22/2017 0704 08/14/17 0645 08/16/17 0624 08/17/17 0623  NA 140 137 137 136  K 3.9 4.4 4.7 4.3  CL 103 110 112* 111  CO2 21* 19* 18* 19*  GLUCOSE 118* 94 80 87  BUN 26* 24* 22* 19  CREATININE 1.75* 1.27* 1.06* 1.00  CALCIUM 9.7 7.6* 7.3* 7.3*  MG  --   --   --  1.6*   GFR: Estimated Creatinine Clearance: 41.6 mL/min (by C-G formula based on SCr of 1 mg/dL). Liver Function Tests: Recent Labs  Lab 08/22/2017 0704 08/16/17 0624  AST 23 22  ALT 11* 6*  ALKPHOS 55 43  BILITOT 0.8 0.9  PROT 5.1* 4.0*  ALBUMIN 2.4* 2.0*   Recent Labs  Lab 08/25/2017 0704  LIPASE 34   No results for input(s): AMMONIA in the last 168 hours. Coagulation Profile: Recent Labs  Lab 08/24/2017 0704  INR 0.91   Cardiac Enzymes: No results for input(s): CKTOTAL, [name], [name], TROPONINI in the last 168 hours. BNP (last 3 results) Recent Labs    03/29/17 1646  PROBNP 84.0   HbA1C: No results for input(s): HGBA1C in the last 72 hours. CBG: No results for input(s): GLUCAP in the last 168 hours. Lipid Profile: No results for input(s): CHOL, [name], [name], [name], [name], LDLDIRECT in the last 72 hours. Thyroid Function Tests: No results for input(s): TSH, T4TOTAL, FREET4, T3FREE, THYROIDAB in the last 72 hours. Anemia Panel: No results for input(s): VITAMINB12, FOLATE, FERRITIN, TIBC, IRON, RETICCTPCT in the last 72  hours. Urine analysis:    Component Value Date/Time   COLORURINE AMBER (A) 08/29/2017 1048   APPEARANCEUR HAZY (A) 08/28/2017 1048   LABSPEC 1.023 09/01/2017 1048   LABSPEC 1.005 03/07/2017 1549   PHURINE 5.0 08/28/2017 1048   GLUCOSEU NEGATIVE 08/19/2017 1048   GLUCOSEU Negative 03/07/2017 1549   HGBUR NEGATIVE 08/28/2017 1048   HGBUR negative 12/03/2008 1113   BILIRUBINUR NEGATIVE 08/16/2017 1048   BILIRUBINUR Negative 03/07/2017 1549   KETONESUR NEGATIVE 08/14/2017 1048   PROTEINUR 30 (A) 09/11/2017 1048   UROBILINOGEN 0.2 03/07/2017 1549   NITRITE NEGATIVE 08/15/2017 1048   LEUKOCYTESUR LARGE (A) 08/14/2017 1048   LEUKOCYTESUR Small 03/07/2017 1549   Sepsis Labs: @LABRCNTIP (procalcitonin:4,lacticidven:4)  ) Recent Results (from the past 240 hour(s))  Blood Culture (routine x 2)     Status: None (Preliminary result)   Collection Time: 08/27/2017  8:22 AM  Result Value Ref Range Status   Specimen Description BLOOD RIGHT ANTECUBITAL  Final   Special Requests   Final    BOTTLES DRAWN AEROBIC AND ANAEROBIC Blood Culture adequate volume   Culture   Final  NO GROWTH 4 DAYS Performed at [facility], [place] [address], [place],  [address]    Report Status PENDING  Incomplete  Blood Culture (routine x 2)     Status: None (Preliminary result)   Collection Time: 08/30/2017  8:23 AM  Result Value Ref Range Status   Specimen Description BLOOD LEFT FOREARM  Final   Special Requests   Final    BOTTLES DRAWN AEROBIC AND ANAEROBIC Blood Culture adequate volume   Culture   Final    NO GROWTH 4 DAYS Performed at [facility], [place] [address], [place],  [address]    Report Status PENDING  Incomplete  Culture, Urine     Status: Abnormal   Collection Time: 09/04/2017  2:30 PM  Result Value Ref Range Status   Specimen Description URINE, RANDOM  Final   Special Requests   Final    NONE Performed at [facility], [place] [address], [place], [place] [address]     Culture >=100,000 COLONIES/mL ESCHERICHIA COLI (A)  Final   Report Status 08/15/2017 FINAL  Final   Organism ID, Bacteria ESCHERICHIA COLI (A)  Final      Susceptibility   Escherichia coli - MIC*    AMPICILLIN >=32 RESISTANT Resistant     CEFAZOLIN <=4 SENSITIVE Sensitive     CEFTRIAXONE <=1 SENSITIVE Sensitive     CIPROFLOXACIN >=4 RESISTANT Resistant     GENTAMICIN >=16 RESISTANT Resistant     IMIPENEM <=0.25 SENSITIVE Sensitive     NITROFURANTOIN 256 RESISTANT Resistant     TRIMETH/SULFA >=320 RESISTANT Resistant     AMPICILLIN/SULBACTAM 4 SENSITIVE Sensitive     PIP/TAZO <=4 SENSITIVE Sensitive     Extended ESBL NEGATIVE Sensitive     * >=100,000 COLONIES/mL ESCHERICHIA COLI      Studies: No results found.  Scheduled Meds: . cephALEXin  500 mg Oral Q12H  . chlorhexidine  15 mL Mouth Rinse BID  . clopidogrel  75 mg Oral QHS  . cloZAPine  300 mg Oral QHS  . colchicine  0.6 mg Oral Daily  . diltiazem  240 mg Oral q morning - 10a  . enoxaparin (LOVENOX) injection  30 mg Subcutaneous Q24H  . guaiFENesin  1,200 mg Oral Daily  . ipratropium-albuterol  3 mL Inhalation BID  . levothyroxine  37.5 mcg Intravenous QAC breakfast  . methylPREDNISolone (SOLU-MEDROL) injection  40 mg Intravenous Daily  . mometasone-formoterol  2 puff Inhalation BID  . nystatin  5 mL Oral QID  . pantoprazole  40 mg Oral QHS  . polyethylene glycol  17 g Oral BID  . pravastatin  20 mg Oral QHS  . senna-docusate  1 tablet Oral BID  . sodium chloride flush  3 mL Intravenous Q12H  . tamsulosin  0.4 mg Oral QPC supper  . tiotropium  18 mcg Inhalation Daily    Continuous Infusions:   LOS: 4 days     Desiree Hane, MD [facility] Pager [phone]  If 7PM-7AM, please contact night-coverage [email or web address] Password [facility] 08/17/2017, 7:23 PM

## 2017-08-17 NOTE — Progress Notes (Addendum)
Pharmacy Antibiotic Note  Rebecca [name] is a 77 y.o. female admitted on 09/04/2017 with sepsis.  Pharmacy has been consulted for fluconazole dosing for Oropharangeal candidiasis. CKD stage IV. SCr 1.0 Crcl ~ 42 ml/min .   Renal function noted stable, at baseline.  Plan: Fluconazole 50mg  po q24h x 14 days  (recommended to treat 7-10 days for moderate to severe  Infection).   Height: 5\' 3"  (160 cm) Weight: 140 lb (63.5 kg) IBW/kg (Calculated) : 52.4  Temp (24hrs), Avg:98.1 F (36.7 C), Min:98 F (36.7 C), Max:98.2 F (36.8 C)  Recent Labs  Lab 08/28/2017 0704 09/11/2017 0716 08/18/2017 1006 08/27/2017 2231 08/14/17 0645 08/16/17 0624 08/17/17 0623  WBC 21.2*  --   --  14.6* 22.0* 14.0* 25.6*  CREATININE 1.75*  --   --   --  1.27* 1.06* 1.00  LATICACIDVEN  --  2.39* 0.92  --   --   --   --     Estimated Creatinine Clearance: 41.6 mL/min (by C-G formula based on SCr of 1 mg/dL).    Allergies  Allergen Reactions  . Levaquin [Levofloxacin] Other (See Comments)    Ruptured tendon  . Tape Other (See Comments)    PATIENT'S SKIN IS VERY, VERY THIN AND WILL TEAR AND BRUISE EASILY; PLEASE USE AN ALTERNATIVE (SOMETHING OTHER THAN TAPE)   Thank you for allowing pharmacy to be a part of this patient's care.  Nicole Cella, RPh Clinical Pharmacist Pager: [phone] 08/17/2017 8:11 PM

## 2017-08-17 NOTE — Progress Notes (Addendum)
Occupational Therapy Treatment Patient Details Name: Rebecca [name] MRN: [id number] DOB: 1941/04/17 Today's Date: 08/17/2017    History of present illness 77yo female with multiple medical problems, failure to thrive, dysphagia, aspiration pneumonia now brought to ED with nausea, brown emesis, chest discomfort, hypotension, elevated troponins. Diagonsed with ileus due to chronic constipation. PMH AKI, CAD, CKD, COPD, DM, heart failure, frozen shoulder, hypothyroidism, polymyalgia rheumatica, renal insufficiency, schizoaffective disorder, CVA, cardiac cath    OT comments  Pt agreeable to OOB to chair this afternoon; requires much encouragement to attempt functional tasks on her own prior to therapist assisting. Required max assist for stand pivot transfer. Attempted to educate pt and daughter on safe transfer techniques at pt currently holds around her daughters neck but pt seems to be set on utilizing this transfer technique, declined use of Stedy. D/c plan remains appropriate. Will continue to follow acutely.   Follow Up Recommendations  No OT follow up;Supervision/Assistance - 24 hour(continue hospice care)    Equipment Recommendations  None recommended by OT    Recommendations for Other Services      Precautions / Restrictions Precautions Precautions: Fall;Other (comment) Precaution Comments: non-ambulatory at baseline  Restrictions Weight Bearing Restrictions: No       Mobility Bed Mobility Overal bed mobility: Needs Assistance Bed Mobility: Supine to Sit     Supine to sit: Min assist     General bed mobility comments: Assist for LEs to EOB and light assist for trunk elevation to sitting. Requires significant encouragement to attempt on her own  Transfers Overall transfer level: Needs assistance Equipment used: 1 person hand held assist Transfers: Sit to/from Stand;Stand Pivot Transfers Sit to Stand: Max assist Stand pivot transfers: Max assist       General transfer  comment: Max assist for sit to stand and pivot from EOB to chair    Balance Overall balance assessment: Needs assistance Sitting-balance support: Feet supported;Bilateral upper extremity supported Sitting balance-Leahy Scale: Fair Sitting balance - Comments: Min guard assist   Standing balance support: During functional activity;Bilateral upper extremity supported Standing balance-Leahy Scale: Poor                             ADL either performed or assessed with clinical judgement   ADL Overall ADL's : Needs assistance/impaired                         Toilet Transfer: Maximal assistance;Stand-pivot Toilet Transfer Details (indicate cue type and reason): Simulated by stand pivot EOB to chair         Functional mobility during ADLs: Maximal assistance(for stand pivot only) General ADL Comments: Attempted to educate pt and daughter on proper technique for transfers but pt insisting on transferring like she does at home--holding around her daughters neck     Vision       Perception     Praxis      Cognition Arousal/Alertness: Awake/alert Behavior During Therapy: WFL for tasks assessed/performed Overall Cognitive Status: Within Functional Limits for tasks assessed                                          Exercises     Shoulder Instructions       General Comments      Pertinent Vitals/ Pain  Pain Assessment: Faces Faces Pain Scale: Hurts little more Pain Location: generalized Pain Descriptors / Indicators: Discomfort Pain Intervention(s): Monitored during session;Limited activity within patient's tolerance;Repositioned  Home Living                                          Prior Functioning/Environment              Frequency  Min 2X/week        Progress Toward Goals  OT Goals(current goals can now be found in the care plan section)  Progress towards OT goals: Progressing toward  goals  Acute Rehab OT Goals Patient Stated Goal: to go home  OT Goal Formulation: With patient/family  Plan Discharge plan remains appropriate    Co-evaluation                 AM-PAC PT "6 Clicks" Daily Activity     Outcome Measure   Help from another person eating meals?: A Little Help from another person taking care of personal grooming?: A Little Help from another person toileting, which includes using toliet, bedpan, or urinal?: A Lot Help from another person bathing (including washing, rinsing, drying)?: A Lot Help from another person to put on and taking off regular upper body clothing?: A Little Help from another person to put on and taking off regular lower body clothing?: A Lot 6 Click Score: 15    End of Session Equipment Utilized During Treatment: Gait belt  OT Visit Diagnosis: Other abnormalities of gait and mobility (R26.89);Muscle weakness (generalized) (M62.81)   Activity Tolerance Patient tolerated treatment well   Patient Left in chair;with call bell/phone within reach;with family/visitor present   Nurse Communication Mobility status        Time: 1191-4782 OT Time Calculation (min): 15 min  Charges: OT General Charges $OT Visit: 1 Visit OT Treatments $Therapeutic Activity: 8-22 mins  Lesly Pontarelli A. Ulice Brilliant, M.S., OTR/L Pager: [place] 08/17/2017, 5:26 PM

## 2017-08-18 LAB — CULTURE, BLOOD (ROUTINE X 2)
CULTURE: NO GROWTH
Culture: NO GROWTH
Special Requests: ADEQUATE
Special Requests: ADEQUATE

## 2017-08-18 MED ORDER — SODIUM CHLORIDE 0.9 % IV SOLN
INTRAVENOUS | Status: AC
  Administered 2017-08-18: 14:00:00 via INTRAVENOUS

## 2017-08-18 MED ORDER — FLEET ENEMA 7-19 GM/118ML RE ENEM
1.0000 | ENEMA | Freq: Once | RECTAL | Status: AC
  Administered 2017-08-18: 1 via RECTAL
  Filled 2017-08-18: qty 1

## 2017-08-18 MED ORDER — ENOXAPARIN SODIUM 40 MG/0.4ML ~~LOC~~ SOLN
40.0000 mg | SUBCUTANEOUS | Status: DC
  Administered 2017-08-18 – 2017-08-19 (×2): 40 mg via SUBCUTANEOUS
  Filled 2017-08-18 (×2): qty 0.4

## 2017-08-18 NOTE — Progress Notes (Signed)
  Speech Language Pathology Treatment: Dysphagia  Patient Details Name: Rebecca [name] MRN: [id number] DOB: 06-Dec-1940 Today's Date: 08/18/2017 Time: 1530-1540 SLP Time Calculation (min) (ACUTE ONLY): 10 min  Assessment / Plan / Recommendation Clinical Impression  Pt has been appropriately receiving clear liquids on tray with daughter thickening to nectar thick. Observed with thin water which is allowed from [employer] (pre measured) amount with immediate cough which is expected as her aspiration is chronic. Daughter reported giving thin broth from [employer] cup and "she didn't cough." SLP explained she may not always cough when aspirating and we cannot be certain by just observing. She stated she would not thicken up the broth and give via spoon. Continue to follow.    HPI HPI: 77yo female with multiple medical problems, failure to thrive, dysphagia, aspiration pneumonia now brought to ED with nausea, brown emesis, chest discomfort, hypotension, elevated troponins. Diagonsed with ileus due to chronic constipation. PMH AKI, CAD, CKD, COPD, DM, heart failure, frozen shoulder, hypothyroidism, polymyalgia rheumatica, renal insufficiency, schizoaffective disorder, CVA, cardiac cath. CXR Increased left basilar opacity is noted concerning for worsening atelectasis or infiltrate with mild associated pleural effusion. MBS 05/03/17 recommended puree and honey thick liquids, esophagus 80% full.        SLP Plan  Continue with current plan of care       Recommendations  Diet recommendations: Nectar-thick liquid(clear liquids thickened to nectar) Liquids provided via: Cup Medication Administration: Whole meds with puree Supervision: Patient able to self feed;Full supervision/cueing for compensatory strategies Compensations: Slow rate;Small sips/bites;Minimize environmental distractions;Clear throat intermittently;Multiple dry swallows after each bite/sip Postural Changes and/or Swallow Maneuvers: Seated upright  90 degrees                Oral Care Recommendations: Oral care BID Follow up Recommendations: None SLP Visit Diagnosis: Dysphagia, pharyngoesophageal phase (R13.14) Plan: Continue with current plan of care                      [name] 08/18/2017, 3:51 PM   Orbie Pyo Remington Highbaugh M.Ed [employer] [phone]

## 2017-08-18 NOTE — Care Management Note (Signed)
Case Management Note  Patient Details  Name: NEAVEH BELANGER MRN: [id number] Date of Birth: 05-14-1941  Subjective/Objective:                    Action/Plan:  Patient from home with [facility] and daughter  .  They want to return home at discharge with [facility].   Confirmed address in [facility]. Patient will need ambulance transport home at time of discharge.   Patient has home oxygen , hospital bed, bedside commode, and transport chair at home.   PT recommending a stedy for home. Patient and daughter both agreeable to stedy . Cheri with [facility] will check to see if [facility] is available through [facility] and if hospice covers same. [facility] will call the daughter directly with information.   Expected Discharge Date:                  Expected Discharge Plan:  Home w Hospice Care  In-House Referral:     Discharge planning Services  CM Consult  Post Acute Care Choice:  Hospice Choice offered to:     DME Arranged:    DME Agency:     HH Arranged:    HH Agency:     Status of Service:  In process, will continue to follow  If discussed at Long Length of Stay Meetings, dates discussed:    Additional Comments:  Marilu Favre, RN 08/18/2017, 1:47 PM

## 2017-08-18 NOTE — Progress Notes (Signed)
PROGRESS NOTE  Rebecca [name] ONG:[id number] DOB: Feb 23, 1941 DOA: 08/17/2017 PCP: [name], MD  HPI/Recap of past 24 hours:  Rebecca [name] is a 77 y.o. year old female with medical history significant for COPD, PMR, CAD, CVA, mild esophageal dysmotility, recently transition to home with hospice (04/2017) given guarded prognosis of prior comorbidities who presented on 08/19/2017 with chest pain, N/V and was found to have constipation related to ileus.   Interval History Abdomen she is more distended per patient's daughter.  Denies any worsening abdominal pain.  Still tolerating diet without any nausea or vomiting.  Still without bowel movement.  Assessment/Plan: Principal Problem:   Ileus (HCC) Active Problems:   Constipation, chronic   Abnormal urinalysis   Dehydration   Elevated troponin   Diastolic heart failure, NYHA class 1 (HCC)   Hypothyroidism, adult   Current chronic use of systemic steroids   COPD (chronic obstructive pulmonary disease) (HCC)   CAD (coronary artery disease)   CKD (chronic kidney disease), stage IV (HCC)   #Constipation secondary to ileus In setting of chronic opioid use CT abdomen and abdominal x-ray consistent with ileus and large colonic stool burden, no signs of bowel obstruction Passing flatus, still awaiting bowel movement -senna docusate 2 tabs twice daily, continue MiraLAX, -Added fleets enema -Low threshold to repeat abdominal x-ray to ensure no obstruction - Patient does not tolerate enemas well - Serial abdominal exams  Tachycardia, suspect related to poor hydration Improvement in diet however still minimal p.o. intake -Gentle hydration over the next 24 hours -Continue to monitor  #UTI secondary to E. 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 Baseline diet pure (thin liquids per swallow function study 04/2017) Multiple episodes of aspiration pneumonia Chronic steroid use puts her  at risk for oral candidiasis (concern for recurrence given new patches), family has declined EGD in the past the patient responded well to empiric Diflucan at that time Chest x-ray shows right opacity consistent with aspiration -Continue Diflucan (200 mg loading then 100 mg qd x 7 days or until improves) -DC IV steroids, resume oral prednisone -Aspiration precautions  #Elevated troponin on admission #CAD/HLD Troponin elevated to 0.19 on admission Daughter declined further workup -Continue home plavix,Toprol and pravastatin  #Polymyalgia rheumatica -Oral prednisone  #COPD No wheezing significant rhonchi due to poor ability to clear secretions Chest x-ray shows right-sided opacity concerning for aspiration -Dulera daily, spiriva, duonebs BID -Supportive care with incentive spirometry and flutter valve -morphine PRN SOB  #CHFpEF, euvolemic -Continue to monitor volume status EF  #CKD, stage IV Creatinine actually improved at 1 ( baseline 1.6-1.7)  #Anemia of chronic kidney disease, stable -continue to monitor  #Hypothyroidism, stable Levothyroxine  #Hypertension, improved -Diltiazem  #Schizoaffective d/s -home clozapine  Code Status: DNR  Family Communication: Daughter at bedside  Disposition Plan: Improvement in bowel regimen (awaiting BM), improvement in diet   Consultants:  None  Procedures:  None  Antimicrobials:  3/2-3/3: Ceftriaxone  3/2 vancomycin and Zosyn  Keflex, 3/4--  Cultures:  3/2: Urine-E. coli (ampicillin, nitrofurantoin, Bactrim, ciprofloxacin and gentamicin resistant)  3/2: Blood cultures negative growth to date  Telemetry:  DVT prophylaxis: Lovenox   Objective: Vitals:   08/17/17 2120 08/18/17 0354 08/18/17 1000 08/18/17 1426  BP: 119/67 140/67 (!) 135/57 (!) 123/56  Pulse: (!) 104 (!) 111  (!) 103  Resp: 18 18  18   Temp: 98.4 F (36.9 C) 98.1 F (36.7 C)  98.8 F (37.1 C)  TempSrc: Oral Oral  Oral  SpO2: 98% 91%   95%  Weight:      Height:        Intake/Output Summary (Last 24 hours) at 08/18/2017 1912 Last data filed at 08/18/2017 1839 Gross per 24 hour  Intake 836.75 ml  Output 425 ml  Net 411.75 ml   Filed Weights   08/31/2017 0654  Weight: 63.5 kg (140 lb)    Exam:  General: ill appearing woman lying in bed,no apparent distres Cardiovascular: regular rate and rhythm, no murmurs, rubs or gallops, no edema Respiratory: Normal respiratory effort on room air, lungs clear to auscultation bilaterally on anterior chest Abdomen: soft, distended, tympanic to palpation, nontender, minimal bowel sounds Skin: diffuse bruises on upper arms and legs ( from areas of IV lines)  Data Reviewed: CBC: Recent Labs  Lab 08/20/2017 0704 08/30/2017 2231 08/14/17 0645 08/16/17 0624 08/17/17 0623  WBC 21.2* 14.6* 22.0* 14.0* 25.6*  NEUTROABS  --  13.6*  --  11.6*  --   HGB 11.3* 8.1* 8.2* 8.9* 9.1*  HCT 34.8* 24.5* 24.7* 27.1* 28.1*  MCV 93.5 93.5 93.6 93.8 91.5  PLT 299 150 184 207 956   Basic Metabolic Panel: Recent Labs  Lab 08/23/2017 0704 08/14/17 0645 08/16/17 0624 08/17/17 0623  NA 140 137 137 136  K 3.9 4.4 4.7 4.3  CL 103 110 112* 111  CO2 21* 19* 18* 19*  GLUCOSE 118* 94 80 87  BUN 26* 24* 22* 19  CREATININE 1.75* 1.27* 1.06* 1.00  CALCIUM 9.7 7.6* 7.3* 7.3*  MG  --   --   --  1.6*   GFR: Estimated Creatinine Clearance: 41.6 mL/min (by C-G formula based on SCr of 1 mg/dL). Liver Function Tests: Recent Labs  Lab 08/28/2017 0704 08/16/17 0624  AST 23 22  ALT 11* 6*  ALKPHOS 55 43  BILITOT 0.8 0.9  PROT 5.1* 4.0*  ALBUMIN 2.4* 2.0*   Recent Labs  Lab 09/10/2017 0704  LIPASE 34   No results for input(s): AMMONIA in the last 168 hours. Coagulation Profile: Recent Labs  Lab 08/12/2017 0704  INR 0.91   Cardiac Enzymes: No results for input(s): CKTOTAL, [name], [name], TROPONINI in the last 168 hours. BNP (last 3 results) Recent Labs    03/29/17 1646  PROBNP 84.0    HbA1C: No results for input(s): HGBA1C in the last 72 hours. CBG: No results for input(s): GLUCAP in the last 168 hours. Lipid Profile: No results for input(s): CHOL, [name], [name], [name], [name], LDLDIRECT in the last 72 hours. Thyroid Function Tests: No results for input(s): TSH, T4TOTAL, FREET4, T3FREE, THYROIDAB in the last 72 hours. Anemia Panel: No results for input(s): VITAMINB12, FOLATE, FERRITIN, TIBC, IRON, RETICCTPCT in the last 72 hours. Urine analysis:    Component Value Date/Time   COLORURINE AMBER (A) 08/17/2017 1048   APPEARANCEUR HAZY (A) 08/30/2017 1048   LABSPEC 1.023 08/18/2017 1048   LABSPEC 1.005 03/07/2017 1549   PHURINE 5.0 09/07/2017 1048   GLUCOSEU NEGATIVE 09/02/2017 1048   GLUCOSEU Negative 03/07/2017 1549   HGBUR NEGATIVE 09/01/2017 1048   HGBUR negative 12/03/2008 1113   BILIRUBINUR NEGATIVE 08/17/2017 1048   BILIRUBINUR Negative 03/07/2017 1549   KETONESUR NEGATIVE 08/25/2017 1048   PROTEINUR 30 (A) 09/03/2017 1048   UROBILINOGEN 0.2 03/07/2017 1549   NITRITE NEGATIVE 08/30/2017 1048   LEUKOCYTESUR LARGE (A) 09/05/2017 1048   LEUKOCYTESUR Small 03/07/2017 1549   Sepsis Labs: @LABRCNTIP (procalcitonin:4,lacticidven:4)  ) Recent Results (from the past 240 hour(s))  Blood Culture (routine x 2)     Status: None   Collection Time: 09/02/2017  8:22 AM  Result Value Ref Range Status   Specimen Description BLOOD RIGHT ANTECUBITAL  Final   Special Requests   Final    BOTTLES DRAWN AEROBIC AND ANAEROBIC Blood Culture adequate volume   Culture   Final    NO GROWTH 5 DAYS Performed at [facility], [address], [place], [place] [address]    Report Status 08/18/2017 FINAL  Final  Blood Culture (routine x 2)     Status: None   Collection Time: 08/20/2017  8:23 AM  Result Value Ref Range Status   Specimen Description BLOOD LEFT FOREARM  Final   Special Requests   Final    BOTTLES DRAWN AEROBIC AND ANAEROBIC Blood Culture adequate volume    Culture   Final    NO GROWTH 5 DAYS Performed at [facility], [place] [address] [place], [place] [address]    Report Status 08/18/2017 FINAL  Final  Culture, Urine     Status: Abnormal   Collection Time: 08/12/2017  2:30 PM  Result Value Ref Range Status   Specimen Description URINE, RANDOM  Final   Special Requests   Final    NONE Performed at [facility], [place] [address] [place], [place] [address]    Culture >=100,000 COLONIES/mL ESCHERICHIA COLI (A)  Final   Report Status 08/15/2017 FINAL  Final   Organism ID, Bacteria ESCHERICHIA COLI (A)  Final      Susceptibility   Escherichia coli - MIC*    AMPICILLIN >=32 RESISTANT Resistant     CEFAZOLIN <=4 SENSITIVE Sensitive     CEFTRIAXONE <=1 SENSITIVE Sensitive     CIPROFLOXACIN >=4 RESISTANT Resistant     GENTAMICIN >=16 RESISTANT Resistant     IMIPENEM <=0.25 SENSITIVE Sensitive     NITROFURANTOIN 256 RESISTANT Resistant     TRIMETH/SULFA >=320 RESISTANT Resistant     AMPICILLIN/SULBACTAM 4 SENSITIVE Sensitive     PIP/TAZO <=4 SENSITIVE Sensitive     Extended ESBL NEGATIVE Sensitive     * >=100,000 COLONIES/mL ESCHERICHIA COLI      Studies: No results found.  Scheduled Meds: . cephALEXin  500 mg Oral Q12H  . chlorhexidine  15 mL Mouth Rinse BID  . clopidogrel  75 mg Oral QHS  . cloZAPine  300 mg Oral QHS  . colchicine  0.6 mg Oral Daily  . diltiazem  240 mg Oral q morning - 10a  . enoxaparin (LOVENOX) injection  40 mg Subcutaneous Q24H  . fluconazole  50 mg Oral QHS  . guaiFENesin  1,200 mg Oral Daily  . ipratropium-albuterol  3 mL Inhalation BID  . levothyroxine  37.5 mcg Intravenous QAC breakfast  . mometasone-formoterol  2 puff Inhalation BID  . pantoprazole  40 mg Oral QHS  . polyethylene glycol  17 g Oral BID  . pravastatin  20 mg Oral QHS  . predniSONE  10 mg Oral Q breakfast  . senna-docusate  2 tablet Oral BID  . sodium chloride flush  3 mL Intravenous Q12H  . tamsulosin  0.4 mg Oral QPC supper   . tiotropium  18 mcg Inhalation Daily    Continuous Infusions: . sodium chloride 75 mL/hr at 08/18/17 1417     LOS: 5 days     Desiree Hane, MD [facility] Pager [phone]  If 7PM-7AM, please contact night-coverage [email or web address] Password [facility] 08/18/2017, 7:12 PM

## 2017-08-18 NOTE — Progress Notes (Signed)
Physical Therapy Treatment Patient Details Name: Rebecca [name] MRN: [id number] DOB: 01/01/1941 Today's Date: 08/18/2017    History of Present Illness 77yo female with multiple medical problems, failure to thrive, dysphagia, aspiration pneumonia now brought to ED with nausea, brown emesis, chest discomfort, hypotension, elevated troponins. Diagonsed with ileus due to chronic constipation. PMH AKI, CAD, CKD, COPD, DM, heart failure, frozen shoulder, hypothyroidism, polymyalgia rheumatica, renal insufficiency, schizoaffective disorder, CVA, cardiac cath     PT Comments    Patient agreeable to participate in therapy but she does need encouragement. Daughter present and provided encouragement for OOB mobility. Therapist brought fall risk bundle to pt's room for practice with gait belt for functional transfers.  Caregiver education provided for safe transfers with use of gait belt. Pt required min A for bed mobility and max A for transfers.   Follow Up Recommendations  Home health PT;Other (comment)(as able with hospice in the picture )     Equipment Recommendations  Other (comment)(Stedy-not sure if this is possible through hospice/insurance)    Recommendations for Other Services       Precautions / Restrictions Precautions Precautions: Fall;Other (comment) Precaution Comments: non-ambulatory at baseline  Restrictions Weight Bearing Restrictions: No    Mobility  Bed Mobility Overal bed mobility: Needs Assistance Bed Mobility: Supine to Sit     Supine to sit: Min assist     General bed mobility comments: use of rail and HOB elevated; assist for LEs to EOB and light assist for trunk elevation to sitting. Requires significant encouragement to attempt on her own  Transfers Overall transfer level: Needs assistance Equipment used: (face to face with gait belt) Transfers: Sit to/from Stand;Stand Pivot Transfers Sit to Stand: Max assist Stand pivot transfers: Max assist        General transfer comment: sit to stands X2 and stand pivot performed; first sit to stand and stand pivot therapist assisted patient to stand and maintain balance in standing with bilat knees blocked with use of gait belt; second trial pt's daughter assisted with min guard assist from therapist and cues for body mechanics and technique; pt needs max cues to keep hands around arms instead of neck  Ambulation/Gait             General Gait Details: unable at baseline    Stairs            Wheelchair Mobility    Modified Rankin (Stroke Patients Only)       Balance Overall balance assessment: Needs assistance Sitting-balance support: Feet supported;Bilateral upper extremity supported Sitting balance-Leahy Scale: Fair Sitting balance - Comments: Min guard assist   Standing balance support: During functional activity;Bilateral upper extremity supported Standing balance-Leahy Scale: Poor Standing balance comment: reliant on external support                             Cognition Arousal/Alertness: Awake/alert Behavior During Therapy: WFL for tasks assessed/performed Overall Cognitive Status: Within Functional Limits for tasks assessed                                        Exercises      General Comments        Pertinent Vitals/Pain Pain Assessment: Faces Faces Pain Scale: No hurt Pain Intervention(s): Monitored during session    Home Living  Prior Function            PT Goals (current goals can now be found in the care plan section) Acute Rehab PT Goals Patient Stated Goal: to go home  PT Goal Formulation: With patient/family Time For Goal Achievement: 08/29/17 Potential to Achieve Goals: Good Progress towards PT goals: Progressing toward goals    Frequency    Min 3X/week      PT Plan Current plan remains appropriate    Co-evaluation              AM-PAC PT "6 Clicks" Daily Activity   Outcome Measure  Difficulty turning over in bed (including adjusting bedclothes, sheets and blankets)?: Unable Difficulty moving from lying on back to sitting on the side of the bed? : Unable Difficulty sitting down on and standing up from a chair with arms (e.g., wheelchair, bedside commode, etc,.)?: Unable Help needed moving to and from a bed to chair (including a wheelchair)?: A Lot Help needed walking in hospital room?: Total Help needed climbing 3-5 steps with a railing? : Total 6 Click Score: 7    End of Session Equipment Utilized During Treatment: Gait belt Activity Tolerance: Patient tolerated treatment well Patient left: in chair;with call bell/phone within reach;with family/visitor present Nurse Communication: Mobility status PT Visit Diagnosis: Unsteadiness on feet (R26.81);Muscle weakness (generalized) (M62.81);Other abnormalities of gait and mobility (R26.89)     Time: 5027-7412 PT Time Calculation (min) (ACUTE ONLY): 25 min  Charges:  $Therapeutic Activity: 23-37 mins                    G Codes:       Earney Navy, PTA Pager: [phone]     Darliss Cheney 08/18/2017, 12:15 PM

## 2017-08-18 NOTE — Progress Notes (Signed)
[place]:   Pt continues to be active with [facility] [place]: She reported having 2 small flatulence yesterday still no BM. Tolerating clear liquid diet well. States she has no Nausea. Her abdomen she reports as feeling softer but still tender to palpate.  She is pleasant. Offered encouragement and support to daughter who feels like it is taking a long time for this to clear up. Encouraged for other family to come sit with pt to give her some relief. She has called her brother who will be coming up from [facility] to stay over the weekend.   Thank you for helping care for Ms. Schrager and please call if you have questions. Webb Silversmith RN   [phone]

## 2017-08-19 ENCOUNTER — Inpatient Hospital Stay (HOSPITAL_COMMUNITY)

## 2017-08-19 LAB — CBC
HCT: 27.8 % — ABNORMAL LOW (ref 36.0–46.0)
Hemoglobin: 9.2 g/dL — ABNORMAL LOW (ref 12.0–15.0)
MCH: 30.8 pg (ref 26.0–34.0)
MCHC: 33.1 g/dL (ref 30.0–36.0)
MCV: 93 fL (ref 78.0–100.0)
PLATELETS: 165 10*3/uL (ref 150–400)
RBC: 2.99 MIL/uL — AB (ref 3.87–5.11)
RDW: 16.7 % — ABNORMAL HIGH (ref 11.5–15.5)
WBC: 20.1 10*3/uL — AB (ref 4.0–10.5)

## 2017-08-19 MED ORDER — BOOST / RESOURCE BREEZE PO LIQD CUSTOM: 1.0000 | Freq: Three times a day (TID) | ORAL | Status: DC

## 2017-08-19 MED ORDER — ADULT MULTIVITAMIN W/MINERALS CH
1.0000 | ORAL_TABLET | Freq: Every day | ORAL | Status: DC
  Administered 2017-08-20: 1 via ORAL
  Filled 2017-08-19: qty 1

## 2017-08-19 MED ORDER — SODIUM CHLORIDE 0.9 % IV SOLN
INTRAVENOUS | Status: DC
  Administered 2017-08-19 – 2017-08-20 (×2): via INTRAVENOUS

## 2017-08-19 NOTE — Consult Note (Signed)
[facility] Consult  Referring Provider: Desiree Hane, MD Primary Care Physician:  [name], MD Primary [place]  Reason for Consultation:  Ileus  HPI: Rebecca [name] is a 77 y.o. female was referred for ileus. She was admitted on 09/05/2017 with chest pain, nausea and vomiting and had a CAT scan of the abdomen and pelvis without contrast which showed chronic colonic tortuosity and moderate stool burden more liquid stool in ascending colon.No small bowel dilatation or obstruction. No evidence of bowel inflammation.Large colonic stool burden. She was treated with IV fluids,MiraLAX 17 g twice a day, senna 2 tabs twice a day was also given enemas without resolution of the symptoms. she continues to have abdominal distention and has not had a bowel movement.   Past Medical History:  Diagnosis Date  . AKI (acute kidney injury) ([place])   . Anemia in chronic renal disease   . Aortic atherosclerosis ([place]) 01/27/2017  . CAD (coronary artery disease) 2006   a. s/p DES to RCA in 2006 b. low-risk NST in 2014 c. cath in 08/2015 showing patent RCA stent with nonobstructive disease  . CKD (chronic kidney disease) stage 4, GFR 15-29 ml/min (HCC)   . Complication of anesthesia   . COPD (chronic obstructive pulmonary disease) ([place])   . Diabetes mellitus   . Diastolic heart failure, NYHA class 1 ([place])   . Dyslipidemia   . Dysuria 01/06/2015  . Frozen shoulder    right  . Gastroesophageal reflux   . HAP (hospital-acquired pneumonia)   . Headache   . Hyperkalemia 01/01/2016  . Hypothyroidism   . 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 Obesity   . Pancreatic mass 01/27/2017  . Pneumonia 06/2016  . Polymyalgia rheumatica ([place])   . PONV (postoperative nausea and vomiting)   . Renal insufficiency   . Respiratory distress   . Schizoaffective disorder ([place])   . Sepsis due to pneumonia ([place])   . Shortness of breath   . Stroke [facility])      Past Surgical History:  Procedure Laterality Date  . ABDOMINAL HYSTERECTOMY    . CARDIAC CATHETERIZATION N/A 08/13/2015   Procedure: Left Heart Cath and Coronary Angiography;  Surgeon: Jettie Booze, MD;  Location: [place] [facility];  Service: Cardiovascular;  Laterality: N/A;  . CATARACT EXTRACTION    . CHOLECYSTECTOMY    . CORONARY ANGIOPLASTY WITH STENT PLACEMENT  2006   [place] DES to RCA, 50 % residual  . PARTIAL HYSTERECTOMY  1995    Prior to Admission medications   Medication Sig Start Date End Date Taking? Authorizing Provider  acetaminophen (TYLENOL) 500 MG tablet Take 1 tablet (500 mg total) by mouth every 6 (six) hours as needed (pain). Patient taking differently: Take 1,000 mg 2 (two) times daily as needed by mouth (pain).  11/21/16  Yes Arrien, Jimmy Picket, MD  bisacodyl (DULCOLAX) 10 MG suppository Place 1 suppository (10 mg total) rectally daily as needed for moderate constipation. 05/11/17  Yes Aline August, MD  cetirizine (ZYRTEC) 10 MG tablet Take 10 mg by mouth at bedtime.    Yes [provider]  chlorhexidine (PERIDEX) 0.12 % solution 15 mLs by Mouth Rinse route 2 (two) times daily. 05/11/17  Yes Aline August, MD  clopidogrel (PLAVIX) 75 MG tablet Take 1 tablet (75 mg total) by mouth daily with breakfast. Patient taking differently: Take 75 mg by mouth at bedtime.  10/18/13  Yes Thurnell Lose, MD  clotrimazole (LOTRIMIN) 1 % cream Apply  topically 2 (two) times daily. Apply to under left breast. May also apply under right breast as needed 05/11/17  Yes Aline August, MD  cloZAPine (CLOZARIL) 100 MG tablet Take 300 mg by mouth at bedtime. 11/04/16  Yes [provider]  colchicine 0.6 MG tablet Take 0.6 mg by mouth See admin instructions. 5 days on then 3 days off   Yes [provider]  Cranberry (SM CRANBERRY) 300 MG tablet Take 300 mg daily with breakfast by mouth.    Yes [provider]  diltiazem (CARDIZEM CD) 240 MG  24 hr capsule Take 240 mg by mouth every morning.   Yes [provider]  fluticasone (FLONASE) 50 MCG/ACT nasal spray Place 2 sprays daily as needed into both nostrils (seasonal allergies).  03/04/17  Yes [provider]  Fluticasone-Salmeterol (ADVAIR) 250-50 MCG/DOSE AEPB Inhale 1 puff into the lungs at bedtime.   Yes [provider]  furosemide (LASIX) 20 MG tablet Take 20 mg daily as needed by mouth (weight gain of 3 lbs in 24 hours).   Yes [provider]  Guaifenesin 1200 MG TB12 Take 1,200 mg by mouth daily. 06/09/17  Yes [provider]  ipratropium-albuterol (DUONEB) 0.5-2.5 (3) MG/3ML SOLN Inhale 3 mLs into the lungs 3 (three) times daily.  11/05/16  Yes [provider]  levothyroxine (SYNTHROID, LEVOTHROID) 75 MCG tablet Take 75 mcg by mouth daily before breakfast.   Yes [provider]  Liniments ([place]) Place 1 patch See admin instructions onto the skin. Apply one patch to lower back and one patch to hip every morning as needed for pain, remove after 8 hours   Yes [provider]  Morphine Sulfate (MORPHINE CONCENTRATE) 10 MG/0.5ML SOLN concentrated solution Take 0.25 mLs (5 mg total) by mouth every 3 (three) hours as needed for severe pain or shortness of breath (air hunger). 05/11/17  Yes Aline August, MD  Multiple Vitamin (MULTIVITAMIN WITH MINERALS) TABS tablet Take 1 tablet by mouth every 3 (three) days.    Yes [provider]  nitroGLYCERIN (NITROSTAT) 0.4 MG SL tablet Place 1 tablet (0.4 mg total) under the tongue every 5 (five) minutes as needed. For chest pain Patient taking differently: Place 0.4 mg under the tongue every 5 (five) minutes as needed for chest pain.  01/11/17  Yes Strader, [place] M, PA-C  ondansetron (ZOFRAN) 4 MG tablet Take 1 tablet (4 mg total) by mouth every 8 (eight) hours as needed for nausea or vomiting. 05/11/17  Yes Aline August, MD  pantoprazole  (PROTONIX) 40 MG tablet Take 1 tablet (40 mg total) by mouth daily. Patient taking differently: Take 40 mg daily with breakfast by mouth.  04/03/13  Yes Hilty, Nadean Corwin, MD  polyethylene glycol powder (GLYCOLAX/MIRALAX) powder Take 17 g by mouth 2 (two) times daily. [place] Patient taking differently: Take 17 g 2 (two) times daily by mouth. Mix in 8 oz liquid and drink 01/28/17  Yes Samuella Cota, MD  pravastatin (PRAVACHOL) 20 MG tablet Take 20 mg by mouth at bedtime.   Yes [provider]  predniSONE (DELTASONE) 10 MG tablet Take 1 tablet (10 mg total) by mouth daily with breakfast. 10/11/16  Yes Charlynne Cousins, MD  Probiotic Product (PROBIOTIC PO) Take 1 capsule daily with breakfast by mouth.    Yes [provider]  senna-docusate (SENNA-S) 8.6-50 MG tablet Take 2 tablets at bedtime by mouth.   Yes [provider]  tamsulosin [facility])  0.4 MG CAPS capsule Take 1 capsule (0.4 mg total) by mouth daily. Patient taking differently: Take 0.4 mg daily with breakfast by mouth.  03/27/17  Yes Thurnell Lose, MD  Tiotropium Bromide Monohydrate (SPIRIVA RESPIMAT) 2.5 MCG/ACT AERS Inhale 2 puffs daily into the lungs.    Yes [provider]  Vitamin D, Ergocalciferol, (DRISDOL) 50000 UNITS CAPS capsule Take 1 capsule (50,000 Units total) by mouth every 7 (seven) days. Patient taking differently: Take 50,000 Units by mouth every Wednesday.  08/27/13  Yes Kinnie Feil, MD  hydrocortisone (ANUSOL-HC) 2.5 % rectal cream Place rectally 2 (two) times daily. Patient not taking: Reported on 09/07/2017 05/11/17   Aline August, MD  isosorbide mononitrate (IMDUR) 60 MG 24 hr tablet Take 1 tablet (60 mg total) by mouth daily. Patient not taking: Reported on 09/06/2017 03/14/13   Isaiah Serge, NP    Current Facility-Administered Medications  Medication Dose Route Frequency Provider Last Rate Last Dose  . 0.9 %  sodium chloride infusion   Intravenous Continuous  Oretha Milch D, MD 75 mL/hr at 08/19/17 1417    . acetaminophen (TYLENOL) tablet 650 mg  650 mg Oral Q6H PRN Samella Parr, NP   650 mg at 08/19/17 1043   Or  . acetaminophen (TYLENOL) suppository 650 mg  650 mg Rectal Q6H PRN Samella Parr, NP      . cephALEXin (KEFLEX) 250 MG/5ML suspension 500 mg  500 mg Oral Q12H Reyne Dumas, MD   500 mg at 08/19/17 1031  . chlorhexidine (PERIDEX) 0.12 % solution 15 mL  15 mL Mouth Rinse BID Purohit, Shrey C, MD   15 mL at 08/19/17 1031  . clopidogrel (PLAVIX) tablet 75 mg  75 mg Oral QHS Purohit, Shrey C, MD   75 mg at 08/18/17 2205  . cloZAPine (CLOZARIL) tablet 300 mg  300 mg Oral QHS Bodenheimer, Charles A, NP   300 mg at 08/18/17 2206  . colchicine tablet 0.6 mg  0.6 mg Oral Daily Purohit, Shrey C, MD   0.6 mg at 08/19/17 1031  . diltiazem (CARDIZEM CD) 24 hr capsule 240 mg  240 mg Oral q morning - 10a Purohit, Shrey C, MD   240 mg at 08/19/17 1031  . enoxaparin (LOVENOX) injection 40 mg  40 mg Subcutaneous Q24H Karren Cobble, RPH   40 mg at 08/18/17 1830  . feeding supplement (BOOST / RESOURCE BREEZE) liquid 1 Container  1 Container Oral TID BM Oretha Milch D, MD      . fluconazole (DIFLUCAN) tablet 50 mg  50 mg Oral QHS Oretha Milch D, MD   50 mg at 08/18/17 2205  . fluticasone (FLONASE) 50 MCG/ACT nasal spray 2 spray  2 spray Each Nare Daily PRN Samella Parr, NP   2 spray at 08/17/17 1604  . guaiFENesin (MUCINEX) 12 hr tablet 1,200 mg  1,200 mg Oral Daily Purohit, Shrey C, MD   1,200 mg at 08/19/17 1031  . ipratropium-albuterol (DUONEB) 0.5-2.5 (3) MG/3ML nebulizer solution 3 mL  3 mL Inhalation BID Kinnie Feil, MD   3 mL at 08/19/17 0845  . levothyroxine (SYNTHROID, LEVOTHROID) injection 37.5 mcg  37.5 mcg Intravenous QAC breakfast Samella Parr, NP   37.5 mcg at 08/19/17 0837  . mometasone-formoterol (DULERA) 200-5 MCG/ACT inhaler 2 puff  2 puff Inhalation BID Samella Parr, NP   2 puff at 08/19/17 0846  . morphine  CONCENTRATE 10 MG/0.5ML oral solution 5 mg  5 mg  Oral Q3H PRN Samella Parr, NP   5 mg at 08/19/17 1346  . multivitamin with minerals tablet 1 tablet  1 tablet Oral Daily Oretha Milch D, MD      . ondansetron (ZOFRAN) tablet 4 mg  4 mg Oral Q6H PRN Samella Parr, NP       Or  . ondansetron [facility]) injection 4 mg  4 mg Intravenous Q6H PRN Samella Parr, NP   4 mg at 08/19/17 3295  . pantoprazole (PROTONIX) EC tablet 40 mg  40 mg Oral QHS Sloan Leiter B, RPH   40 mg at 08/18/17 2205  . polyethylene glycol (MIRALAX / GLYCOLAX) packet 17 g  17 g Oral BID Reyne Dumas, MD   17 g at 08/19/17 1031  . pravastatin (PRAVACHOL) tablet 20 mg  20 mg Oral QHS Purohit, Shrey C, MD   20 mg at 08/18/17 2205  . predniSONE (DELTASONE) tablet 10 mg  10 mg Oral Q breakfast Oretha Milch D, MD   10 mg at 08/19/17 0837  . [place]   Oral PRN Elwin Mocha, MD      . senna-docusate (Senokot-S) tablet 2 tablet  2 tablet Oral BID Desiree Hane, MD   2 tablet at 08/19/17 1031  . sodium chloride flush (NS) 0.9 % injection 3 mL  3 mL Intravenous Q12H Samella Parr, NP   3 mL at 08/19/17 1033  . tamsulosin (FLOMAX) capsule 0.4 mg  0.4 mg Oral QPC supper Kinnie Feil, MD   0.4 mg at 08/18/17 1830  . tiotropium [facility]) inhalation capsule 18 mcg  18 mcg Inhalation Daily Delane Ginger, RPH   18 mcg at 08/18/17 1884    Allergies as of 08/14/2017 - Review Complete 08/23/2017  Allergen Reaction Noted  . Levaquin [levofloxacin] Other (See Comments) 08/10/2015  . Tape Other (See Comments) 07/14/2016    Family History  Problem Relation Age of Onset  . Diabetes Mother   . Heart disease Mother   . Heart disease Father   . Arthritis Father   . Healthy Sister   . Heart disease Brother   . Cancer Brother        colon ca  . Osteoporosis Sister   . Heart disease Brother   . Diabetes Brother   . Heart disease Brother   . Diabetes Brother   . Diabetes Brother   . Healthy  Brother   . Healthy Brother   . Healthy Brother   . Cancer Maternal Uncle        blood condition  . Cancer Daughter        breast ca  . Cancer Cousin        NHL    Social History   Socioeconomic History  . Marital status: Divorced    Spouse name: Not on file  . Number of children: 3  . Years of education: 9th  . Highest education level: Not on file  Social Needs  . Financial resource strain: Not on file  . Food insecurity - worry: Not on file  . Food insecurity - inability: Not on file  . Transportation needs - medical: Not on file  . Transportation needs - non-medical: Not on file  Occupational History  . Occupation: homemaker  Tobacco Use  . Smoking status: Former Smoker    Packs/day: 1.00    Years: 35.00    Pack years: 35.00    Last attempt to quit: 04/14/2004  Years since quitting: 13.3  . Smokeless tobacco: Never Used  Substance and Sexual Activity  . Alcohol use: No  . Drug use: No  . Sexual activity: No  Other Topics Concern  . Not on file  Social History Narrative   Daughter [name] and son are very involved.     [name] comes to most office visits.   She now lives at home with daughter [name]   Daughter is HCPOA, not averse to SNF/rehab in short-term, but does not wish pt to return to [facility] and requests that pt/pt's care not be discussed by [facility] providers to [facility], moving forward.   Caffeine Use: 1-3 cups daily    Review of Systems: Positive for: GI: Described in detail in HPI.    Gen:  fatigue, weakness, malaise,Denies any fever, chills, rigors, night sweats, anorexia involuntary weight loss, and sleep disorder CV:  chest pain,Denies angina, palpitations, syncope, orthopnea, PND, peripheral edema, and claudication. Resp: Denies dyspnea, cough, sputum, wheezing, coughing up blood. GU : Denies urinary burning, blood in urine, urinary frequency, urinary hesitancy, nocturnal urination, and urinary incontinence. MS: inability to move  lower extremities from muscle weakness, Denies joint pain or swelling.  Denies muscle cramps.  Derm: Denies rash, itching, oral ulcerations, hives, unhealing ulcers.  Psych: Denies depression, anxiety, memory loss, suicidal ideation, hallucinations,  and confusion. Heme: Denies bruising, bleeding, and enlarged lymph nodes. Neuro:  Denies any headaches, dizziness, paresthesias. Endo:  DM, Denies any problems with thyroid, adrenal function.  Physical Exam: Vital signs in last 24 hours: Temp:  [98.6 F (37 C)-99.4 F (37.4 C)] 98.6 F (37 C) (03/08 1418) Pulse Rate:  [99-107] 101 (03/08 1418) Resp:  [15-16] 16 (03/08 0846) BP: (92-121)/(36-49) 92/36 (03/08 1418) SpO2:  [93 %-99 %] 93 % (03/08 0846) Last BM Date: 08/18/17  General:   Mechele Claude, appears older than stated age, bed bound Head:  Normocephalic and atraumatic. Eyes:  Mild pallor. Ears:  Normal auditory acuity. Nose:  No deformity, discharge,  or lesions. Mouth:  No deformity or lesions.  Dry Oropharynx. Neck:  Supple; no masses or thyromegaly. Lungs:  Clear throughout to auscultation.   No wheezes, crackles, or rhonchi. No acute distress. Heart:  Regular rate and rhythm; no murmurs, clicks, rubs,  or gallops. Extremities:  Without clubbing or edema. Neurologic:  awake but not oriented, unable to move lower extremities for weakness Skin:  Intact without significant lesions or rashes. Psych:  Alert and cooperative. Normal mood and affect. Abdomen: distended but soft, non tender, tympanitic on percussion        Lab Results: Recent Labs    08/17/17 0623 08/19/17 0745  WBC 25.6* 20.1*  HGB 9.1* 9.2*  HCT 28.1* 27.8*  PLT 213 165   BMET Recent Labs    08/17/17 0623  NA 136  K 4.3  CL 111  CO2 19*  GLUCOSE 87  BUN 19  CREATININE 1.00  CALCIUM 7.3*   LFT No results for input(s): PROT, ALBUMIN, AST, ALT, ALKPHOS, BILITOT, BILIDIR, IBILI in the last 72 hours. PT/INR No results for input(s): LABPROT,  INR in the last 72 hours.  Studies/Results: Dg Abd 2 Views  Result Date: 08/19/2017 CLINICAL DATA:  Abdominal pain, distention, constipation EXAM: ABDOMEN - 2 VIEW COMPARISON:  None. FINDINGS: There is gaseous distention of small bowel and colon. There is no evidence of pneumoperitoneum, portal venous gas or pneumatosis. There are no pathologic calcifications along the expected course of the ureters. The osseous structures are unremarkable.  IMPRESSION: Gaseous distention of small bowel and colon concerning for an ileus. Electronically Signed   By: Kathreen Devoid   On: 08/19/2017 14:19    Impression: Ileus-gaseous distention of small bowel and colon. Leukocytosis Normocytic anemia Hypoalbuminemia Pancreatic atrophy and cystic lesion in pancreatic body measuring 2.8 into 2 cm   Plan: NG tube placement and connect to low intermittent suction. Nothing by mouth for now. Abdominal x-ray in a.m. to evalaute if there is an improvement.   LOS: 6 days   Ronnette Juniper, M.D.  08/19/2017, 4:28 PM  Pager [phone] If no answer or after 5 PM call [phone]

## 2017-08-19 NOTE — Progress Notes (Signed)
PROGRESS NOTE  REISE HIETALA DXI:[id number] DOB: March 25, 1941 DOA: 08/23/2017 PCP: [name], MD  HPI/Recap of past 24 hours:  Rebecca [name] is a 77 y.o. year old female with medical history significant for COPD, PMR, CAD, CVA, mild esophageal dysmotility, recently transition to home with hospice (04/2017) given guarded prognosis of prior comorbidities who presented on 08/26/2017 with chest pain, N/V and was found to have constipation related to ileus.   Interval History Small BM after suppository but still distended abdomen, increased nausea, decrease appetite. No vomiting.  Assessment/Plan: Principal Problem:   Ileus (HCC) Active Problems:   Constipation, chronic   Abnormal urinalysis   Dehydration   Elevated troponin   Diastolic heart failure, NYHA class 1 (HCC)   Hypothyroidism, adult   Current chronic use of systemic steroids   COPD (chronic obstructive pulmonary disease) (HCC)   CAD (coronary artery disease)   CKD (chronic kidney disease), stage IV (HCC)   #Constipation secondary to ileus In setting of chronic opioid use CT abdomen and abdominal x-ray consistent with ileus and large colonic stool burden, no signs of bowel obstruction Passing flatus, still awaiting bowel movement -senna docusate 2 tabs twice daily, continue MiraLAX, -s/p fleets enema on 3/8 -Repeat abdominal x-ray to ensure no obstruction, if ok plan for sorbitol - Patient does not tolerate enemas well - Serial abdominal exams  Tachycardia, suspect related to poor hydration Improvement in diet however still minimal p.o. Intake likely related to stool burden. Will rule out obstruction -Gentle hydration over the next 24 hours -Continue to monitor  #UTI secondary to E. coli - Keflex, end date 3/8  #Leukocytosis Peak 25 during admission. 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 Baseline diet pure (thin liquids per swallow function study 04/2017) Multiple episodes of aspiration pneumonia Chronic steroid use puts her at risk for oral candidiasis (concern for recurrence given new patches), family has declined EGD in the past the patient responded well to empiric Diflucan at that time Chest x-ray shows right opacity consistent with aspiration -Continue Diflucan (200 mg loading then 100 mg qd x 7 days or until improves) -DC IV steroids, resume oral prednisone -Aspiration precautions  #Elevated troponin on admission #CAD/HLD Troponin elevated to 0.19 on admission Daughter declined further workup -Continue home plavix,Toprol and pravastatin  #Polymyalgia rheumatica -Oral prednisone  #COPD No wheezing, significant rhonchi due to poor ability to clear secretions Chest x-ray shows right-sided opacity concerning for aspiration -Dulera daily, spiriva, duonebs BID -Supportive care with incentive spirometry and flutter valve -morphine PRN SOB  #CHFpEF, euvolemic -Continue to monitor volume status EF  #CKD, stage IV Creatinine actually improved at 1 ( baseline 1.6-1.7)  #Anemia of chronic kidney disease, stable -continue to monitor  #Hypothyroidism, stable Levothyroxine  #Hypertension, improved -Diltiazem  #Schizoaffective d/s -home clozapine  Code Status: DNR  Family Communication: Daughter at bedside  Disposition Plan: f/u abd xr to rule out obstruction, Improvement in bowel regimen (awaiting BM), improvement in diet   Consultants:  None  Procedures:  None  Antimicrobials:  3/2-3/3: Ceftriaxone  3/2 vancomycin and Zosyn  Keflex, 3/4--  Cultures:  3/2: Urine-E. coli (ampicillin, nitrofurantoin, Bactrim, ciprofloxacin and gentamicin resistant)  3/2: Blood cultures negative growth to date  Telemetry:  DVT prophylaxis: Lovenox   Objective: Vitals:   08/18/17 1426 08/18/17 2125 08/19/17 0535 08/19/17 0846  BP: (!)  123/56 (!) 118/49 (!) 121/40   Pulse: (!) 103  100 99 (!) 107  Resp: 18 15 16 16   Temp: 98.8 F (37.1 C) 99.2 F (37.3 C) 99.4 F (37.4 C)   TempSrc: Oral Oral Oral   SpO2: 95% 99% 93% 93%  Weight:      Height:        Intake/Output Summary (Last 24 hours) at 08/19/2017 1314 Last data filed at 08/19/2017 0856 Gross per 24 hour  Intake 1643.75 ml  Output 475 ml  Net 1168.75 ml   Filed Weights   08/15/2017 0654  Weight: 63.5 kg (140 lb)    Exam:  General: chronically ill appearing woman lying in bed,no apparent distress Cardiovascular: regular rate and rhythm, no murmurs, rubs or gallops, 1+ pitting edema bilateral lower extremities Respiratory: Normal respiratory effort on room air, diffuse rhonchi in lung fields Abdomen: soft, distended, tympanic to palpation, nontender, minimal bowel sounds Skin: diffuse bruises on upper arms and legs ( from areas of IV lines)  Data Reviewed: CBC: Recent Labs  Lab 08/29/2017 2231 08/14/17 0645 08/16/17 0624 08/17/17 0623 08/19/17 0745  WBC 14.6* 22.0* 14.0* 25.6* 20.1*  NEUTROABS 13.6*  --  11.6*  --   --   HGB 8.1* 8.2* 8.9* 9.1* 9.2*  HCT 24.5* 24.7* 27.1* 28.1* 27.8*  MCV 93.5 93.6 93.8 91.5 93.0  PLT 150 184 207 213 035   Basic Metabolic Panel: Recent Labs  Lab 08/22/2017 0704 08/14/17 0645 08/16/17 0624 08/17/17 0623  NA 140 137 137 136  K 3.9 4.4 4.7 4.3  CL 103 110 112* 111  CO2 21* 19* 18* 19*  GLUCOSE 118* 94 80 87  BUN 26* 24* 22* 19  CREATININE 1.75* 1.27* 1.06* 1.00  CALCIUM 9.7 7.6* 7.3* 7.3*  MG  --   --   --  1.6*   GFR: Estimated Creatinine Clearance: 41.6 mL/min (by C-G formula based on SCr of 1 mg/dL). Liver Function Tests: Recent Labs  Lab 09/06/2017 0704 08/16/17 0624  AST 23 22  ALT 11* 6*  ALKPHOS 55 43  BILITOT 0.8 0.9  PROT 5.1* 4.0*  ALBUMIN 2.4* 2.0*   Recent Labs  Lab 08/29/2017 0704  LIPASE 34   No results for input(s): AMMONIA in the last 168 hours. Coagulation Profile: Recent Labs   Lab 09/09/2017 0704  INR 0.91   Cardiac Enzymes: No results for input(s): CKTOTAL, [name], [name], TROPONINI in the last 168 hours. BNP (last 3 results) Recent Labs    03/29/17 1646  PROBNP 84.0   HbA1C: No results for input(s): HGBA1C in the last 72 hours. CBG: No results for input(s): GLUCAP in the last 168 hours. Lipid Profile: No results for input(s): CHOL, [name], [name], [name], [name], LDLDIRECT in the last 72 hours. Thyroid Function Tests: No results for input(s): TSH, T4TOTAL, FREET4, T3FREE, THYROIDAB in the last 72 hours. Anemia Panel: No results for input(s): VITAMINB12, FOLATE, FERRITIN, TIBC, IRON, RETICCTPCT in the last 72 hours. Urine analysis:    Component Value Date/Time   COLORURINE AMBER (A) 09/07/2017 1048   APPEARANCEUR HAZY (A) 08/16/2017 1048   LABSPEC 1.023 08/22/2017 1048   LABSPEC 1.005 03/07/2017 1549   PHURINE 5.0 08/28/2017 1048   GLUCOSEU NEGATIVE 08/27/2017 1048   GLUCOSEU Negative 03/07/2017 1549   HGBUR NEGATIVE 09/07/2017 1048   HGBUR negative 12/03/2008 1113   BILIRUBINUR NEGATIVE 09/04/2017 1048   BILIRUBINUR Negative 03/07/2017 1549   KETONESUR NEGATIVE 08/22/2017 1048   PROTEINUR 30 (A) 08/30/2017 1048   UROBILINOGEN 0.2 03/07/2017 1549   NITRITE NEGATIVE 09/06/2017 1048  LEUKOCYTESUR LARGE (A) 08/20/2017 1048   LEUKOCYTESUR Small 03/07/2017 1549   Sepsis Labs: @LABRCNTIP (procalcitonin:4,lacticidven:4)  ) Recent Results (from the past 240 hour(s))  Blood Culture (routine x 2)     Status: None   Collection Time: 09/08/2017  8:22 AM  Result Value Ref Range Status   Specimen Description BLOOD RIGHT ANTECUBITAL  Final   Special Requests   Final    BOTTLES DRAWN AEROBIC AND ANAEROBIC Blood Culture adequate volume   Culture   Final    NO GROWTH 5 DAYS Performed at [facility], [place] [address], [place], [place] [address]    Report Status 08/18/2017 FINAL  Final  Blood Culture (routine x 2)     Status: None   Collection  Time: 09/07/2017  8:23 AM  Result Value Ref Range Status   Specimen Description BLOOD LEFT FOREARM  Final   Special Requests   Final    BOTTLES DRAWN AEROBIC AND ANAEROBIC Blood Culture adequate volume   Culture   Final    NO GROWTH 5 DAYS Performed at [facility], [place] [address], [place], [place] [address]    Report Status 08/18/2017 FINAL  Final  Culture, Urine     Status: Abnormal   Collection Time: 08/18/2017  2:30 PM  Result Value Ref Range Status   Specimen Description URINE, RANDOM  Final   Special Requests   Final    NONE Performed at [facility], [place] [address], [place], [place] [address]    Culture >=100,000 COLONIES/mL ESCHERICHIA COLI (A)  Final   Report Status 08/15/2017 FINAL  Final   Organism ID, Bacteria ESCHERICHIA COLI (A)  Final      Susceptibility   Escherichia coli - MIC*    AMPICILLIN >=32 RESISTANT Resistant     CEFAZOLIN <=4 SENSITIVE Sensitive     CEFTRIAXONE <=1 SENSITIVE Sensitive     CIPROFLOXACIN >=4 RESISTANT Resistant     GENTAMICIN >=16 RESISTANT Resistant     IMIPENEM <=0.25 SENSITIVE Sensitive     NITROFURANTOIN 256 RESISTANT Resistant     TRIMETH/SULFA >=320 RESISTANT Resistant     AMPICILLIN/SULBACTAM 4 SENSITIVE Sensitive     PIP/TAZO <=4 SENSITIVE Sensitive     Extended ESBL NEGATIVE Sensitive     * >=100,000 COLONIES/mL ESCHERICHIA COLI      Studies: No results found.  Scheduled Meds: . cephALEXin  500 mg Oral Q12H  . chlorhexidine  15 mL Mouth Rinse BID  . clopidogrel  75 mg Oral QHS  . cloZAPine  300 mg Oral QHS  . colchicine  0.6 mg Oral Daily  . diltiazem  240 mg Oral q morning - 10a  . enoxaparin (LOVENOX) injection  40 mg Subcutaneous Q24H  . fluconazole  50 mg Oral QHS  . guaiFENesin  1,200 mg Oral Daily  . ipratropium-albuterol  3 mL Inhalation BID  . levothyroxine  37.5 mcg Intravenous QAC breakfast  . mometasone-formoterol  2 puff Inhalation BID  . pantoprazole  40 mg Oral QHS  . polyethylene glycol  17  g Oral BID  . pravastatin  20 mg Oral QHS  . predniSONE  10 mg Oral Q breakfast  . senna-docusate  2 tablet Oral BID  . sodium chloride flush  3 mL Intravenous Q12H  . tamsulosin  0.4 mg Oral QPC supper  . tiotropium  18 mcg Inhalation Daily    Continuous Infusions: . sodium chloride       LOS: 6 days  Desiree Hane, MD [facility] Pager [phone]  If 7PM-7AM, please contact night-coverage [email or web address] Password [email or web address] 08/19/2017, 1:14 PM

## 2017-08-19 NOTE — Progress Notes (Signed)
[facility]:  ° °Met with pt who is sitting up in recliner chair. She is having pain reported to be on her bottom. Daughter at bedside. She is concerned about her mother and still having no BM and now with nausea which had subsided since being here. She has lots of questions about what are we going to do if the scans show a blockage. She was encouraged to think positive and that we would deal with the issues as they come. She fears her mom having to have surgery for a blockage and verbalizes that she doesn't think she would want to do this because she is afraid her mom would not be able to tolerate the surgery. Continued to offer support to her. Pt is weaker today. ° °I did discuss with [facility] and the stedy is not an option on our formulary. Therefore it would not be something covered by us. The family was notified this. Please call with any concerns. Cheri Kennedy RN [phone] °

## 2017-08-19 NOTE — Progress Notes (Addendum)
Initial Nutrition Assessment  DOCUMENTATION CODES:   Not applicable  INTERVENTION:   -MVI daily -Boost Breeze po TID, each supplement provides 250 kcal and 9 grams of protein  NUTRITION DIAGNOSIS:   Inadequate oral intake related to altered GI function as evidenced by (NPO/clear liquids).  GOAL:   Patient will meet greater than or equal to 90% of their needs  MONITOR:   PO intake, Supplement acceptance, Diet advancement, Labs, Weight trends, Skin, I & O's  REASON FOR ASSESSMENT:   NPO/Clear Liquid Diet    ASSESSMENT:   Rebecca [name] is a 77 y.o. female with a Past Medical History significant for MGUS, COPD, CHF, DM, CKD IV who presents with chest pain, N/V. CXR inconclusive, so will check abd/pelvis CT w/o oral contrast if pt can tolerate it. R/o possible early BO.  Pt admitted with ileus secondary to acute on chronic constipation.   Pt receiving nursing care at times of multiple visit. Unable to speak with pt or family at time of visit or complete nutrition-focused physical exam at this time.   Case discussed with SLP, who reports pt is on a pureed diet with nectar thick liquids at home. Pt is on a clear liquid diet and pt daughter is thickening liquids at bedside in order to increase meal options. Pt also utilizes Provale cup to assist with aspiration.   Case discussed with RN, who reports no plans for diet advancement today. Pt is followed by hospice services. Pt still has not had a BM. RN denies pt with swallowing difficulties; pt consumes thickened liquids and takes medications with applesauce.   Reviewed wt hx; wt has been stable over the past 6 months.   Medications reviewed and include prednisone.   Labs reviewed: Mg: 1.6.  Diet Order:  Diet clear liquid Room service appropriate? Yes; Fluid consistency: Thin  EDUCATION NEEDS:   No education needs have been identified at this time  Skin:  Skin Assessment: Skin Integrity Issues: Skin Integrity Issues:: Stage  I Stage I: sacrum  Last BM:  08/18/17  Height:   Ht Readings from Last 1 Encounters:  09/11/2017 5\' 3"  (1.6 m)    Weight:   Wt Readings from Last 1 Encounters:  09/09/2017 140 lb (63.5 kg)    Ideal Body Weight:  52.3 kg  BMI:  Body mass index is 24.8 kg/m.  Estimated Nutritional Needs:   Kcal:  1700-1900  Protein:  85-100 grams  Fluid:  1.7-1.9 L    Nikolai Wilczak A. Jimmye Norman, RD, LDN, CDE Pager: [phone] After hours Pager: [phone]

## 2017-08-19 NOTE — Progress Notes (Signed)
Physical Therapy Treatment Patient Details Name: Rebecca [name] MRN: [id number] DOB: Jan 28, 1941 Today's Date: 08/19/2017    History of Present Illness 77yo female with multiple medical problems, failure to thrive, dysphagia, aspiration pneumonia now brought to ED with nausea, brown emesis, chest discomfort, hypotension, elevated troponins. Diagonsed with ileus due to chronic constipation. PMH AKI, CAD, CKD, COPD, DM, heart failure, frozen shoulder, hypothyroidism, polymyalgia rheumatica, renal insufficiency, schizoaffective disorder, CVA, cardiac cath     PT Comments    Pt was able to get OOB to chair and preform LE/UE exercises from the chair.  She needs continued encouragement to help move herself as much as she can.  Daughter in room and encouraging.     Follow Up Recommendations  Home health PT;Other (comment)     Equipment Recommendations  None recommended by PT    Recommendations for Other Services   NA     Precautions / Restrictions Precautions Precautions: Fall;Other (comment) Precaution Comments: non-ambulatory at baseline     Mobility  Bed Mobility Overal bed mobility: Needs Assistance Bed Mobility: Supine to Sit     Supine to sit: Min assist;HOB elevated     General bed mobility comments: Pt needs encouragement to assist herself to the side of the bed instead of telling the therapist and the mobility tech to pull her up.   Transfers Overall transfer level: Needs assistance Equipment used: None Transfers: Squat Pivot Transfers     Squat pivot transfers: +2 physical assistance;Max assist     General transfer comment: two person max assist to squat and pivot to pt's left to get to the chair.  Pt holding on tightly to our arms, but not supporting herself much at all through her legs.    Ambulation/Gait             General Gait Details: Pt is non ambulatory at baseline          Balance Overall balance assessment: Needs assistance Sitting-balance  support: Feet supported;Bilateral upper extremity supported Sitting balance-Leahy Scale: Poor Sitting balance - Comments: min assist pt tipping to the left today in sitting.  Postural control: Left lateral lean Standing balance support: Bilateral upper extremity supported Standing balance-Leahy Scale: Zero Standing balance comment: max assist to squat, not stand and pivot.                             Cognition Arousal/Alertness: Awake/alert Behavior During Therapy: WFL for tasks assessed/performed                                   General Comments: She is at her baseline "spunkiness"      Exercises General Exercises - Upper Extremity Shoulder Flexion: AAROM;Both;10 reps Elbow Flexion: AAROM;Both;10 reps General Exercises - Lower Extremity Ankle Circles/Pumps: AAROM;Both;20 reps Heel Slides: AAROM;Both;10 reps Hip ABduction/ADduction: AAROM;Both;10 reps        Pertinent Vitals/Pain Pain Assessment: Faces Faces Pain Scale: Hurts little more Pain Location: generalized Pain Descriptors / Indicators: Discomfort Pain Intervention(s): Limited activity within patient's tolerance;Monitored during session;Repositioned           PT Goals (current goals can now be found in the care plan section) Acute Rehab PT Goals Patient Stated Goal: to go home  Progress towards PT goals: Progressing toward goals    Frequency    Min 3X/week      PT Plan Current plan  remains appropriate       AM-PAC PT "6 Clicks" Daily Activity  Outcome Measure  Difficulty turning over in bed (including adjusting bedclothes, sheets and blankets)?: Unable Difficulty moving from lying on back to sitting on the side of the bed? : Unable Difficulty sitting down on and standing up from a chair with arms (e.g., wheelchair, bedside commode, etc,.)?: Unable Help needed moving to and from a bed to chair (including a wheelchair)?: Total Help needed walking in hospital room?:  Total Help needed climbing 3-5 steps with a railing? : Total 6 Click Score: 6    End of Session Equipment Utilized During Treatment: Gait belt Activity Tolerance: Patient tolerated treatment well Patient left: in chair;with call bell/phone within reach;with family/visitor present   PT Visit Diagnosis: Unsteadiness on feet (R26.81);Muscle weakness (generalized) (M62.81);Other abnormalities of gait and mobility (R26.89)     Time: 8257-4935 PT Time Calculation (min) (ACUTE ONLY): 16 min  Charges:  $Therapeutic Activity: 8-22 mins          Shakerra Red B. [place], [place], DPT [phone]            08/19/2017, 3:33 PM

## 2017-08-20 ENCOUNTER — Inpatient Hospital Stay (HOSPITAL_COMMUNITY)

## 2017-08-20 DIAGNOSIS — I5030 Unspecified diastolic (congestive) heart failure: Secondary | ICD-10-CM

## 2017-08-20 DIAGNOSIS — Z7189 Other specified counseling: Secondary | ICD-10-CM

## 2017-08-20 LAB — CBC
HCT: 25.5 % — ABNORMAL LOW (ref 36.0–46.0)
Hemoglobin: 8.2 g/dL — ABNORMAL LOW (ref 12.0–15.0)
MCH: 29.8 pg (ref 26.0–34.0)
MCHC: 32.2 g/dL (ref 30.0–36.0)
MCV: 92.7 fL (ref 78.0–100.0)
PLATELETS: 175 10*3/uL (ref 150–400)
RBC: 2.75 MIL/uL — ABNORMAL LOW (ref 3.87–5.11)
RDW: 16.4 % — AB (ref 11.5–15.5)
WBC: 15.1 10*3/uL — ABNORMAL HIGH (ref 4.0–10.5)

## 2017-08-20 LAB — BASIC METABOLIC PANEL
Anion gap: 10 (ref 5–15)
BUN: 21 mg/dL — AB (ref 6–20)
CALCIUM: 6.5 mg/dL — AB (ref 8.9–10.3)
CO2: 18 mmol/L — ABNORMAL LOW (ref 22–32)
CREATININE: 1.21 mg/dL — AB (ref 0.44–1.00)
Chloride: 112 mmol/L — ABNORMAL HIGH (ref 101–111)
GFR calc Af Amer: 48 mL/min — ABNORMAL LOW (ref >60)
GFR, EST NON AFRICAN AMERICAN: 42 mL/min — AB (ref >60)
Glucose, Bld: 78 mg/dL (ref 65–99)
Potassium: 3.5 mmol/L (ref 3.5–5.1)
Sodium: 140 mmol/L (ref 135–145)

## 2017-08-20 MED ORDER — GLYCOPYRROLATE 0.2 MG/ML IJ SOLN
0.2000 mg | INTRAMUSCULAR | Status: DC
  Administered 2017-08-20 (×3): 0.2 mg via INTRAVENOUS
  Filled 2017-08-20 (×3): qty 1

## 2017-08-20 MED ORDER — LORAZEPAM 2 MG/ML IJ SOLN: 0.5000 mg | INTRAMUSCULAR | Status: DC | PRN

## 2017-08-20 MED ORDER — MORPHINE SULFATE (PF) 4 MG/ML IV SOLN
1.0000 mg | INTRAVENOUS | Status: DC
  Administered 2017-08-20 (×2): 1 mg via INTRAVENOUS
  Filled 2017-08-20 (×2): qty 1

## 2017-08-20 MED ORDER — DEXAMETHASONE SODIUM PHOSPHATE 4 MG/ML IJ SOLN
4.0000 mg | INTRAMUSCULAR | Status: DC
  Administered 2017-08-20: 4 mg via INTRAVENOUS
  Filled 2017-08-20: qty 1

## 2017-08-20 MED ORDER — MORPHINE SULFATE (PF) 4 MG/ML IV SOLN
1.0000 mg | INTRAVENOUS | Status: DC | PRN
  Administered 2017-08-20: 1 mg via INTRAVENOUS
  Filled 2017-08-20: qty 1

## 2017-08-20 MED ORDER — POTASSIUM CHLORIDE 10 MEQ/100ML IV SOLN: 10.0000 meq | INTRAVENOUS | Status: DC

## 2017-08-20 MED ORDER — MORPHINE SULFATE (PF) 4 MG/ML IV SOLN
1.0000 mg | INTRAVENOUS | Status: DC | PRN
  Administered 2017-08-20 (×2): 1 mg via INTRAVENOUS
  Filled 2017-08-20 (×2): qty 1

## 2017-08-20 MED ORDER — DEXTROSE-NACL 5-0.45 % IV SOLN
INTRAVENOUS | Status: DC
  Administered 2017-08-20: 12:00:00 via INTRAVENOUS

## 2017-08-20 NOTE — Progress Notes (Signed)
Pt. Resting comfortable at this time. Many family members  at bedside. Will continue to monitor.

## 2017-08-20 NOTE — Significant Event (Signed)
Rapid Response Event Note  Overview:  Called for severe resp distress Time Called: 1145 Arrival Time: 1150 Event Type: Respiratory  Initial Focused Assessment:  Patient with severe resp distress - using accessory muscles to breathe - unresponsive - some slight purposeful movement - skin cold, dry and pale - on NRB mask with low sats 74%  Rr 36 bil BS present with coarse rhonchi throughout = upper airway wheezing.  RT Claiborne Billings present states she has had nebulizer and started with vest physiotherapy but did not tolerate.  Her sats have been marginal all day with periods of desats.  Family and RN Pattie present in room - states she was alert earlier and talking = now using all energy to breathe.  Abd distended - NGT patent to golden bile return.  BP low 93/35 HR 92.     Interventions: Patient is DNR - stat page to Dr. Lonny Prude.  Speaking with family regarding current status - options of treatment discussed - Bipap - meds.  After few minutes decreased distress but still using accessory muscles with some agonal breathing at times - O2 sats remain low 70% on NRB mask and decreased LOC.  Family clear about wanting patient comfortable - discussed options for this.  Dr. Lonny Prude to room - full comfort measures initiated.  Handoff to [employer] - will given morphine per order for comfort.   Plan of Care (if not transferred): Comfort measures.  Called for comfort cart for family.  Support given.   Event Summary: Name of Physician Notified: Dr. Oretha Milch at (paged PTA RRT)    at    Outcome: Code status clarified, Stayed in room and stabalized  Event End Time: 1230  Quin Hoop

## 2017-08-20 NOTE — Plan of Care (Addendum)
Paged to bedside this morning due to Rebecca [name] having severe respiratory distress. Rapid Response had already evaluated patient and continued venti mask due to declining oxygen saturation.    I spoke with daughter about her mother's worsening respiratory condition. She made it clear her mother had shared she would not want intubation in the event of worsening respiratory status.  After discussing how hard her mother was working to breathe and how uncomfortable she was in general, her daughter made known her wish to pursue comfort care measures.   All lab draws have since been discontinued. Comfort care orders were placed in addition to IV morphine for dyspnea and agitation was started as well.    [facility]

## 2017-08-20 NOTE — Progress Notes (Signed)
Alesa A Longie 12:11 PM  Subjective: Patient seen and examined and discussed with my partner Dr. Raliegh Ip and her hospital computer chart reviewed and her case and condition discussed with her daughter and she is better than she was but her abdomen is still distended but the patient does not have much complaints and is fairly lethargic  Objective: Vital signs stable afebrile no acute distress minimal output in NG abdomen is soft occasional bowel sounds nontender probable distention potassium 3.5 white count increased x-ray unchanged  Assessment: Ileus and constipation in a patient with multiple medical problems  Plan: Increased potassium greater than 4 try to increase activity and decrease narcotics consider MiraLAX via NG and hold section for 2 hours to her 3 times a day also consider erythromycin or even Reglan as motility agents and will check on tomorrow  [facility]  Pager [phone] After 5PM or if no answer call [phone]

## 2017-08-20 NOTE — Progress Notes (Signed)
Called by RN to start chest PT as ordered by MD. Upon arrival pt SATS were in the 50's-60's on venti mask. Pt placed on 100% non rebreather. SATs up to 89. Patient started on chest vest. Patient became unresponsive 5 minutes into treatment. Vest stopped rapid response and RN at bedside at this time. MD paged.

## 2017-08-20 NOTE — Progress Notes (Signed)
Upon entering pt room for breathing treatments nasal cannula was flipped upside down and out of patient nares. SATs were 79%. After breathing treatments and switching to venturi mask for mouth breathing SATs came up to 92%.

## 2017-08-20 NOTE — Progress Notes (Signed)
[place]:  Pt continues to be active with Hospice services. She has had a very rapid change in her condition since yesterday and the daughter is really having a hard time processing it all. Spoke to the daughter that if they were looking at transferring her to full comfort then the option does exist to transfer to the [facility] in [facility]. She felt this was not in her mothers best interest because of the distress she gets in when moved. The pt did not appear comfortable to me at this time she was using accessory muscles in her neck and abdomen to help her breath. She was pulling at her mask and NG tube. She was given a dose of morphine and this does help her relax some. Spoke to the daughter about comfort care. [facility] was asked to see her upon entering the hospital last week this was cancelled at that time because pt goals were clear with DNR and MOST form in place. It was discussed for me to reach back out to [facility] if we needed them.  I did call Romona Curls NP with [facility] to come back and speak to family and get help with comfort care orders for the pt. She will meet with family and further discuss goals and help with comfort care.  I have spoke to my Dr. at [facility] if pt is stable and it is felt she can transfer to the hospice home-- I am available tomorrow and will help with this process. We can take pt's to our hospice home over the weekend. Please call with concerns or questions. Webb Silversmith RN  [phone]

## 2017-08-20 NOTE — Progress Notes (Signed)
PROGRESS NOTE  Rebecca [name] EVO:[id number] DOB: 05/28/1941 DOA: 09/05/2017 PCP: [name], MD  HPI/Recap of past 24 hours:  Rebecca [name] is a 77 y.o. year old female with medical history significant for COPD, PMR, CAD, CVA, mild esophageal dysmotility, recently transition to home with hospice (04/2017) given guarded prognosis of prior comorbidities who presented on 08/29/2017 with chest pain, N/V and was found to have constipation related to ileus.   Interval History NG tube placed overnight.  Draining 150 cc fluid.  Patient reports improvement abdominal pain.  Still no BM. Respiratory therapist in room, patient currently receiving breathing treatment.  Patient was found to have nasal cannula but not placed on correctly.  Assessment/Plan: Principal Problem:   Ileus (HCC) Active Problems:   Constipation, chronic   Abnormal urinalysis   Dehydration   Elevated troponin   Diastolic heart failure, NYHA class 1 (HCC)   Hypothyroidism, adult   Current chronic use of systemic steroids   COPD (chronic obstructive pulmonary disease) (HCC)   CAD (coronary artery disease)   CKD (chronic kidney disease), stage IV (HCC)   #Constipation secondary to ileus, stable In setting of chronic opioid use CT abdomen and abdominal x-ray consistent with ileus and large colonic stool burden, no signs of bowel obstruction Passing flatus, still awaiting bowel movement -senna docusate 2 tabs twice daily, continue MiraLAX, -NG tube with low suction placed on 3/8 by GI, appreciate recommendations -Repeat abdominal x-ray in a.m. - Serial abdominal exams  #Tachycardia, suspect related to poor hydration, improving -Gentle hydration over the next 24 hours -Continue to monitor  #UTI secondary to E. coli - Keflex, end date 3/9  #Leukocytosis Peak 25 during admission. 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 Baseline diet pure (thin liquids per swallow function study 04/2017) Multiple episodes of aspiration pneumonia Chronic steroid use puts her at risk for oral candidiasis (concern for recurrence given new patches), family has declined EGD in the past the patient responded well to empiric Diflucan at that time Chest x-ray shows right opacity consistent with aspiration -Continue Diflucan (200 mg loading then 100 mg qd x 7 days or until improves) - oral prednisone -Aspiration precautions  #Elevated troponin on admission #CAD/HLD Troponin elevated to 0.19 on admission Daughter declined further workup -Continue home plavix,Toprol and pravastatin  #Polymyalgia rheumatica -Oral prednisone  #COPD No wheezing, significant rhonchi likely due to poor ability to clear secretions Chest x-ray on 3/5- right-sided opacity concerning for aspiration -Dulera daily, spiriva, duonebs BID -Supportive care with incentive spirometry and flutter valve -morphine PRN SOB  #CHFpEF, euvolemic -Continue to monitor volume status EF  #CKD, stage IV Creatinine actually improved at 1 ( baseline 1.6-1.7)  #Anemia of chronic kidney disease, stable -continue to monitor  #Hypothyroidism, stable Levothyroxine  #Hypertension, improved -Diltiazem  #Schizoaffective d/s -home clozapine  Code Status: DNR  Family Communication: Updated daughter by phone  Disposition Plan: Follow-up repeat abdominal x-ray this morning, continue NG tube, awaiting bowel movement.   Consultants:  None  Procedures:  None  Antimicrobials:  3/2-3/3: Ceftriaxone  3/2 vancomycin and Zosyn  Keflex, 3/4-- 3/9  Cultures:  3/2: Urine-E. coli (ampicillin, nitrofurantoin, Bactrim, ciprofloxacin and gentamicin resistant)  3/2: Blood cultures negative growth to date  Telemetry: None  DVT prophylaxis: Lovenox   Objective: Vitals:   08/19/17 2300  08/20/17 0500 08/20/17 0527 08/20/17 0813  BP:   Marland Kitchen)  101/51   Pulse:   97   Resp:   16   Temp:   97.9 F (36.6 C)   TempSrc:      SpO2: 93% 93% 91% 92%  Weight:      Height:        Intake/Output Summary (Last 24 hours) at 08/20/2017 0828 Last data filed at 08/20/2017 0500 Gross per 24 hour  Intake 1228.75 ml  Output 300 ml  Net 928.75 ml   Filed Weights   08/20/2017 0654  Weight: 63.5 kg (140 lb)    Exam:  General: chronically ill appearing woman lying in bed,no apparent distress Cardiovascular: regular rate and rhythm, no murmurs, rubs or gallops, 1+ pitting edema bilateral lower extremities from ankles to lower shins Respiratory: Normal respiratory effort currently on breathing treatment, diffuse rhonchi in lung fields Abdomen: soft, distended, tympanic to palpation, nontender, minimal bowel sounds Skin: diffuse bruises on upper arms and legs ( from areas of IV lines)  Data Reviewed: CBC: Recent Labs  Lab 09/03/2017 2231 08/14/17 0645 08/16/17 0624 08/17/17 0623 08/19/17 0745  WBC 14.6* 22.0* 14.0* 25.6* 20.1*  NEUTROABS 13.6*  --  11.6*  --   --   HGB 8.1* 8.2* 8.9* 9.1* 9.2*  HCT 24.5* 24.7* 27.1* 28.1* 27.8*  MCV 93.5 93.6 93.8 91.5 93.0  PLT 150 184 207 213 323   Basic Metabolic Panel: Recent Labs  Lab 08/14/17 0645 08/16/17 0624 08/17/17 0623 08/20/17 0720  NA 137 137 136 140  K 4.4 4.7 4.3 3.5  CL 110 112* 111 112*  CO2 19* 18* 19* 18*  GLUCOSE 94 80 87 78  BUN 24* 22* 19 21*  CREATININE 1.27* 1.06* 1.00 1.21*  CALCIUM 7.6* 7.3* 7.3* 6.5*  MG  --   --  1.6*  --    GFR: Estimated Creatinine Clearance: 34.4 mL/min (A) (by C-G formula based on SCr of 1.21 mg/dL (H)). Liver Function Tests: Recent Labs  Lab 08/16/17 0624  AST 22  ALT 6*  ALKPHOS 43  BILITOT 0.9  PROT 4.0*  ALBUMIN 2.0*   No results for input(s): LIPASE, AMYLASE in the last 168 hours. No results for input(s): AMMONIA in the last 168 hours. Coagulation Profile: No results for  input(s): INR, PROTIME in the last 168 hours. Cardiac Enzymes: No results for input(s): CKTOTAL, [name], [name], TROPONINI in the last 168 hours. BNP (last 3 results) Recent Labs    03/29/17 1646  PROBNP 84.0   HbA1C: No results for input(s): HGBA1C in the last 72 hours. CBG: No results for input(s): GLUCAP in the last 168 hours. Lipid Profile: No results for input(s): CHOL, [name], [name], [name], [name], LDLDIRECT in the last 72 hours. Thyroid Function Tests: No results for input(s): TSH, T4TOTAL, FREET4, T3FREE, THYROIDAB in the last 72 hours. Anemia Panel: No results for input(s): VITAMINB12, FOLATE, FERRITIN, TIBC, IRON, RETICCTPCT in the last 72 hours. Urine analysis:    Component Value Date/Time   COLORURINE AMBER (A) 08/26/2017 1048   APPEARANCEUR HAZY (A) 08/27/2017 1048   LABSPEC 1.023 09/09/2017 1048   LABSPEC 1.005 03/07/2017 1549   PHURINE 5.0 08/12/2017 1048   GLUCOSEU NEGATIVE 08/29/2017 1048   GLUCOSEU Negative 03/07/2017 1549   HGBUR NEGATIVE 09/04/2017 1048   HGBUR negative 12/03/2008 1113   BILIRUBINUR NEGATIVE 08/25/2017 1048   BILIRUBINUR Negative 03/07/2017 1549   KETONESUR NEGATIVE 09/01/2017 1048   PROTEINUR 30 (A) 09/10/2017 1048   UROBILINOGEN 0.2 03/07/2017 1549   NITRITE NEGATIVE 08/19/2017 1048  LEUKOCYTESUR LARGE (A) 08/23/2017 1048   LEUKOCYTESUR Small 03/07/2017 1549   Sepsis Labs: @LABRCNTIP (procalcitonin:4,lacticidven:4)  ) Recent Results (from the past 240 hour(s))  Blood Culture (routine x 2)     Status: None   Collection Time: 09/10/2017  8:22 AM  Result Value Ref Range Status   Specimen Description BLOOD RIGHT ANTECUBITAL  Final   Special Requests   Final    BOTTLES DRAWN AEROBIC AND ANAEROBIC Blood Culture adequate volume   Culture   Final    NO GROWTH 5 DAYS Performed at [facility], [place] [address], [place], [place] [address]    Report Status 08/18/2017 FINAL  Final  Blood Culture (routine x 2)     Status: None    Collection Time: 08/27/2017  8:23 AM  Result Value Ref Range Status   Specimen Description BLOOD LEFT FOREARM  Final   Special Requests   Final    BOTTLES DRAWN AEROBIC AND ANAEROBIC Blood Culture adequate volume   Culture   Final    NO GROWTH 5 DAYS Performed at [facility], [place] [address], [place], [place] [address]    Report Status 08/18/2017 FINAL  Final  Culture, Urine     Status: Abnormal   Collection Time: 08/15/2017  2:30 PM  Result Value Ref Range Status   Specimen Description URINE, RANDOM  Final   Special Requests   Final    NONE Performed at [facility], [place] [address], [place], [place] [address]    Culture >=100,000 COLONIES/mL ESCHERICHIA COLI (A)  Final   Report Status 08/15/2017 FINAL  Final   Organism ID, Bacteria ESCHERICHIA COLI (A)  Final      Susceptibility   Escherichia coli - MIC*    AMPICILLIN >=32 RESISTANT Resistant     CEFAZOLIN <=4 SENSITIVE Sensitive     CEFTRIAXONE <=1 SENSITIVE Sensitive     CIPROFLOXACIN >=4 RESISTANT Resistant     GENTAMICIN >=16 RESISTANT Resistant     IMIPENEM <=0.25 SENSITIVE Sensitive     NITROFURANTOIN 256 RESISTANT Resistant     TRIMETH/SULFA >=320 RESISTANT Resistant     AMPICILLIN/SULBACTAM 4 SENSITIVE Sensitive     PIP/TAZO <=4 SENSITIVE Sensitive     Extended ESBL NEGATIVE Sensitive     * >=100,000 COLONIES/mL ESCHERICHIA COLI      Studies: Dg Abd 2 Views  Result Date: 08/19/2017 CLINICAL DATA:  Abdominal pain, distention, constipation EXAM: ABDOMEN - 2 VIEW COMPARISON:  None. FINDINGS: There is gaseous distention of small bowel and colon. There is no evidence of pneumoperitoneum, portal venous gas or pneumatosis. There are no pathologic calcifications along the expected course of the ureters. The osseous structures are unremarkable. IMPRESSION: Gaseous distention of small bowel and colon concerning for an ileus. Electronically Signed   By: Kathreen Devoid   On: 08/19/2017 14:19   Dg Abd Portable 1v  Result  Date: 08/19/2017 CLINICAL DATA:  Encounter for nasogastric tube placement. EXAM: PORTABLE ABDOMEN - 1 VIEW COMPARISON:  08/19/2017 FINDINGS: New nasogastric tube with tip and side-port over the stomach. Diffuse gaseous distension of small and large bowel. No evidence of pneumatosis. No visible pneumoperitoneum. Cholecystectomy clips. IMPRESSION: 1. Nasogastric tube tip and side-port overlaps the stomach. 2. Unchanged diffuse gaseous distension of bowel suggesting ileus. Electronically Signed   By: Monte Fantasia M.D.   On: 08/19/2017 19:09    Scheduled Meds: . cephALEXin  500 mg Oral Q12H  . chlorhexidine  15 mL Mouth Rinse BID  .  clopidogrel  75 mg Oral QHS  . cloZAPine  300 mg Oral QHS  . colchicine  0.6 mg Oral Daily  . diltiazem  240 mg Oral q morning - 10a  . enoxaparin (LOVENOX) injection  40 mg Subcutaneous Q24H  . feeding supplement  1 Container Oral TID BM  . fluconazole  50 mg Oral QHS  . guaiFENesin  1,200 mg Oral Daily  . ipratropium-albuterol  3 mL Inhalation BID  . levothyroxine  37.5 mcg Intravenous QAC breakfast  . mometasone-formoterol  2 puff Inhalation BID  . multivitamin with minerals  1 tablet Oral Daily  . pantoprazole  40 mg Oral QHS  . polyethylene glycol  17 g Oral BID  . pravastatin  20 mg Oral QHS  . predniSONE  10 mg Oral Q breakfast  . senna-docusate  2 tablet Oral BID  . sodium chloride flush  3 mL Intravenous Q12H  . tamsulosin  0.4 mg Oral QPC supper  . tiotropium  18 mcg Inhalation Daily    Continuous Infusions: . sodium chloride 75 mL/hr at 08/20/17 0457     LOS: 7 days     Desiree Hane, MD [facility] Pager [phone]  If 7PM-7AM, please contact night-coverage [email or web address] Password [facility] 08/20/2017, 8:28 AM

## 2017-08-20 NOTE — Consult Note (Signed)
Consultation Note Date: 08/20/2017   Patient Name: Rebecca [name]  DOB: 1940-07-16  MRN: [id number]  Age / Sex: 77 y.o., female  PCP: [name], MD Referring Physician: Desiree Hane, MD  Reason for Consultation: Establishing goals of care, Hospice Evaluation, Inpatient hospice referral, Non pain symptom management, Pain control, Psychosocial/spiritual support and Terminal Care  HPI/Patient Profile: 77 y.o. female  with past medical history of COPD, coronary artery disease, CVA, esophageal dysmotility, poly myalgia rheumatica, chronic kidney disease stage IV, hypothyroidism, schizoaffective disorder congestive heart failure, admitted on 08/20/2017 with chest pain, nausea and vomiting.  Patient was found to be constipated with related ileus.  Per CT scan, patient does not have a small bowel obstruction.  Patient was recently placed under [facility] the [facility] care.  Called to see patient by [facility] the [facility] representative.  Patient has taken an abrupt turn overnight and now appears to be transitioning towards end of life.  Patient's daughter does not want her moved tonight given the level of distress that she has had today.  Hospice representative asked [place] to continue with palliative care consultation  for symptom management (palliative medicine had received an initial consultation when she was admitted but we were later told that further consultation was not warranted at that time)  Clinical Assessment and Goals of Care: Met with patient, chart reviewed.  Met with patient's daughter in the presence of hosp[facility] the [facility] liaison, Webb Silversmith.  Daughter recognizes that her mother is had an abrupt change and is likely nearing end of life.  Attempted to prepare her for a prognosis potentially of just hours to a day or 2.  Her daughter is tearful but understanding," I just want to make sure  she is does not struggle to breathe".  Daughter, Brendia Dampier, is her healthcare proxy.  Patient has another daughter who is due to arrive in town tomorrow night approximately 11 PM.  Ms. Petkus is aware that her mother may not survive until her other daughter reaches town    [place]   Confirmed DNR/DNI No further workup, labs or diagnostics Patient is full comfort care [place] to follow-up tomorrow to see if patient is stable enough for transfer to hospice [facility] Please leave NG tube and Foley catheter in If patient removed NG tube, do not reinsert Code Status/Advance Care Planning:  DNR    Symptom Management:   Pain: Schedule morphine 1 mg every 4 hours and 1-2 mg every 2 hours as needed  Dyspnea: No intubation, no BiPAP.  May continue 100% nonrebreather.  Will start scheduled opioids, monitor and titrate for effect.  Introduce the topic of morphine continuous infusion  Schizoaffective disorder: One of patient's wishes is to continue her Clozaril for as long as she can.  At this point she does have an NG tube in  and appears to be tolerating it.  Do not discontinue NG tube, however, if patient pulls this out do not attempt to reinsert  it  Secretions: We will start scheduled Robinul 0.2 mg IV every 4 hours  Anxiety: Ativan 0.5 mg IV every 4 hours as needed  Palliative Prophylaxis:   Aspiration, Bowel Regimen, Delirium Protocol, Eye Care, Frequent Pain Assessment, Oral Care and Turn Reposition  Additional Recommendations (Limitations, Scope, Preferences):  Full Comfort Care  Psycho-social/Spiritual:   Desire for further Chaplaincy support:no  Additional Recommendations: Grief/Bereavement Support  Prognosis:   Hours - Days  Discharge Planning: Anticipated Hospital Death      Primary Diagnoses: Present on Admission: . Ileus ([place]) . Constipation, chronic . Abnormal urinalysis . Dehydration . Elevated troponin .  Diastolic heart failure, NYHA class 1 ([place]) . Hypothyroidism, adult . COPD (chronic obstructive pulmonary disease) ([place]) . CAD (coronary artery disease) . CKD (chronic kidney disease), stage IV ([place])   I have reviewed the medical record, interviewed the patient and family, and examined the patient. The following aspects are pertinent.  Past Medical History:  Diagnosis Date  . AKI (acute kidney injury) ([place])   . Anemia in chronic renal disease   . Aortic atherosclerosis ([place]) 01/27/2017  . CAD (coronary artery disease) 2006   a. s/p DES to RCA in 2006 b. low-risk NST in 2014 c. cath in 08/2015 showing patent RCA stent with nonobstructive disease  . CKD (chronic kidney disease) stage 4, GFR 15-29 ml/min (H[facility])   . Complication of anesthesia   . COPD (chronic obstructive pulmonary disease) ([place])   . Diabetes mellitus   . Diastolic heart failure, NYHA class 1 ([place])   . Dyslipidemia   . Dysuria 01/06/2015  . Frozen shoulder    right  . Gastroesophageal reflux   . HAP (hospital-acquired pneumonia)   . Headache   . Hyperkalemia 01/01/2016  . Hypothyroidism   . 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 Obesity   . Pancreatic mass 01/27/2017  . Pneumonia 06/2016  . Polymyalgia rheumatica ([place])   . PONV (postoperative nausea and vomiting)   . Renal insufficiency   . Respiratory distress   . Schizoaffective disorder ([place])   . Sepsis due to pneumonia ([place])   . Shortness of breath   . Stroke [facility])    Social History   Socioeconomic History  . Marital status: Divorced    Spouse name: None  . Number of children: 3  . Years of education: 9th  . Highest education level: None  Social Needs  . Financial resource strain: None  . Food insecurity - worry: None  . Food insecurity - inability: None  . Transportation needs - medical: None  . Transportation needs - non-medical: None  Occupational History  . Occupation: homemaker  Tobacco Use  . Smoking status:  Former Smoker    Packs/day: 1.00    Years: 35.00    Pack years: 35.00    Last attempt to quit: 04/14/2004    Years since quitting: 13.3  . Smokeless tobacco: Never Used  Substance and Sexual Activity  . Alcohol use: No  . Drug use: No  . Sexual activity: No  Other Topics Concern  . None  Social History Narrative   Daughter [name] and son are very involved.     [name] comes to most office visits.   She now lives at home with daughter [name]   Daughter is HCPOA, not averse to SNF/rehab in short-term, but does not wish pt to return to [facility] and requests that pt/pt's care not be discussed by [facility] providers to [facility], moving forward.  Caffeine Use: 1-3 cups daily   Family History  Problem Relation Age of Onset  . Diabetes Mother   . Heart disease Mother   . Heart disease Father   . Arthritis Father   . Healthy Sister   . Heart disease Brother   . Cancer Brother        colon ca  . Osteoporosis Sister   . Heart disease Brother   . Diabetes Brother   . Heart disease Brother   . Diabetes Brother   . Diabetes Brother   . Healthy Brother   . Healthy Brother   . Healthy Brother   . Cancer Maternal Uncle        blood condition  . Cancer Daughter        breast ca  . Cancer Cousin        NHL   Scheduled Meds: . chlorhexidine  15 mL Mouth Rinse BID  . clopidogrel  75 mg Oral QHS  . cloZAPine  300 mg Oral QHS  . dexamethasone  4 mg Intravenous Q24H  . diltiazem  240 mg Oral q morning - 10a  . feeding supplement  1 Container Oral TID BM  . glycopyrrolate  0.2 mg Intravenous Q4H  .  morphine injection  1 mg Intravenous Q4H  . sodium chloride flush  3 mL Intravenous Q12H   Continuous Infusions: . dextrose 5 % and 0.45% NaCl 10 mL/hr at 08/20/17 1308   PRN Meds:.acetaminophen **OR** acetaminophen, fluticasone, LORazepam, morphine injection, [DISCONTINUED] ondansetron **OR** ondansetron (ZOFRAN) IV Medications Prior to Admission:  Prior to Admission  medications   Medication Sig Start Date End Date Taking? Authorizing Provider  acetaminophen (TYLENOL) 500 MG tablet Take 1 tablet (500 mg total) by mouth every 6 (six) hours as needed (pain). Patient taking differently: Take 1,000 mg 2 (two) times daily as needed by mouth (pain).  11/21/16  Yes Arrien, Jimmy Picket, MD  bisacodyl (DULCOLAX) 10 MG suppository Place 1 suppository (10 mg total) rectally daily as needed for moderate constipation. 05/11/17  Yes Aline August, MD  cetirizine (ZYRTEC) 10 MG tablet Take 10 mg by mouth at bedtime.    Yes [provider]  chlorhexidine (PERIDEX) 0.12 % solution 15 mLs by Mouth Rinse route 2 (two) times daily. 05/11/17  Yes Aline August, MD  clopidogrel (PLAVIX) 75 MG tablet Take 1 tablet (75 mg total) by mouth daily with breakfast. Patient taking differently: Take 75 mg by mouth at bedtime.  10/18/13  Yes Thurnell Lose, MD  clotrimazole (LOTRIMIN) 1 % cream Apply topically 2 (two) times daily. Apply to under left breast. May also apply under right breast as needed 05/11/17  Yes Aline August, MD  cloZAPine (CLOZARIL) 100 MG tablet Take 300 mg by mouth at bedtime. 11/04/16  Yes [provider]  colchicine 0.6 MG tablet Take 0.6 mg by mouth See admin instructions. 5 days on then 3 days off   Yes [provider]  Cranberry (SM CRANBERRY) 300 MG tablet Take 300 mg daily with breakfast by mouth.    Yes [provider]  diltiazem (CARDIZEM CD) 240 MG 24 hr capsule Take 240 mg by mouth every morning.   Yes [provider]  fluticasone (FLONASE) 50 MCG/ACT nasal spray Place 2 sprays daily as needed into both nostrils (seasonal allergies).  03/04/17  Yes [provider]  Fluticasone-Salmeterol (ADVAIR) 250-50 MCG/DOSE AEPB Inhale 1 puff into the lungs at bedtime.   Yes [provider]  furosemide (LASIX) 20  MG tablet Take 20 mg daily as needed by mouth (weight gain of 3 lbs in 24 hours).   Yes  [provider]  Guaifenesin 1200 MG TB12 Take 1,200 mg by mouth daily. 06/09/17  Yes [provider]  ipratropium-albuterol (DUONEB) 0.5-2.5 (3) MG/3ML SOLN Inhale 3 mLs into the lungs 3 (three) times daily.  11/05/16  Yes [provider]  levothyroxine (SYNTHROID, LEVOTHROID) 75 MCG tablet Take 75 mcg by mouth daily before breakfast.   Yes [provider]  Liniments ([place]) Place 1 patch See admin instructions onto the skin. Apply one patch to lower back and one patch to hip every morning as needed for pain, remove after 8 hours   Yes [provider]  Morphine Sulfate (MORPHINE CONCENTRATE) 10 MG/0.5ML SOLN concentrated solution Take 0.25 mLs (5 mg total) by mouth every 3 (three) hours as needed for severe pain or shortness of breath (air hunger). 05/11/17  Yes Aline August, MD  Multiple Vitamin (MULTIVITAMIN WITH MINERALS) TABS tablet Take 1 tablet by mouth every 3 (three) days.    Yes [provider]  nitroGLYCERIN (NITROSTAT) 0.4 MG SL tablet Place 1 tablet (0.4 mg total) under the tongue every 5 (five) minutes as needed. For chest pain Patient taking differently: Place 0.4 mg under the tongue every 5 (five) minutes as needed for chest pain.  01/11/17  Yes Strader, [place] M, PA-C  ondansetron (ZOFRAN) 4 MG tablet Take 1 tablet (4 mg total) by mouth every 8 (eight) hours as needed for nausea or vomiting. 05/11/17  Yes Aline August, MD  pantoprazole (PROTONIX) 40 MG tablet Take 1 tablet (40 mg total) by mouth daily. Patient taking differently: Take 40 mg daily with breakfast by mouth.  04/03/13  Yes Hilty, Nadean Corwin, MD  polyethylene glycol powder (GLYCOLAX/MIRALAX) powder Take 17 g by mouth 2 (two) times daily. [place] Patient taking differently: Take 17 g 2 (two) times daily by mouth. Mix in 8 oz liquid and drink 01/28/17  Yes Samuella Cota, MD  pravastatin (PRAVACHOL) 20 MG tablet Take 20 mg by mouth at  bedtime.   Yes [provider]  predniSONE (DELTASONE) 10 MG tablet Take 1 tablet (10 mg total) by mouth daily with breakfast. 10/11/16  Yes Charlynne Cousins, MD  Probiotic Product (PROBIOTIC PO) Take 1 capsule daily with breakfast by mouth.    Yes [provider]  senna-docusate (SENNA-S) 8.6-50 MG tablet Take 2 tablets at bedtime by mouth.   Yes [provider]  tamsulosin (FLOMAX) 0.4 MG CAPS capsule Take 1 capsule (0.4 mg total) by mouth daily. Patient taking differently: Take 0.4 mg daily with breakfast by mouth.  03/27/17  Yes Thurnell Lose, MD  Tiotropium Bromide Monohydrate (SPIRIVA RESPIMAT) 2.5 MCG/ACT AERS Inhale 2 puffs daily into the lungs.    Yes [provider]  Vitamin D, Ergocalciferol, (DRISDOL) 50000 UNITS CAPS capsule Take 1 capsule (50,000 Units total) by mouth every 7 (seven) days. Patient taking differently: Take 50,000 Units by mouth every Wednesday.  08/27/13  Yes Kinnie Feil, MD  hydrocortisone (ANUSOL-HC) 2.5 % rectal cream Place rectally 2 (two) times daily. Patient not taking: Reported on 08/12/2017 05/11/17   Aline August, MD  isosorbide mononitrate (IMDUR) 60 MG 24 hr tablet Take 1 tablet (60 mg total) by mouth daily. Patient not taking: Reported on 09/07/2017 03/14/13   Isaiah Serge, NP   Allergies  Allergen Reactions  . Levaquin [Levofloxacin] Other (See  Comments)    Ruptured tendon  . Tape Other (See Comments)    PATIENT'S SKIN IS VERY, VERY THIN AND WILL TEAR AND BRUISE EASILY; PLEASE USE AN ALTERNATIVE (SOMETHING OTHER THAN TAPE)   Review of Systems  Unable to perform ROS: Acuity of condition    Physical Exam  Constitutional: She appears well-developed and well-nourished.  Acutely ill, older female who appears to be actively dying  HENT:  Head: Normocephalic and atraumatic.  Cardiovascular:  Tachycardic, irregular  Pulmonary/Chest:  Increased work of breathing; respiratory rate 28-30/min Wearing 100%  nonrebreather Upper airway secretions present  Abdominal: Soft.  NG tube  Genitourinary:  Genitourinary Comments: Foley catheter  Neurological:  Unresponsive to voice and light touch  Skin: Skin is warm and dry. There is pallor.  Psychiatric:  No overt agitation otherwise unable to test  Nursing note and vitals reviewed.   Vital Signs: BP (!) 106/38 (BP Location: Right Wrist)   Pulse 96   Temp (!) 97.5 F (36.4 C) (Axillary)   Resp 20   Ht 5' 3"  (1.6 m)   Wt 63.5 kg (140 lb)   SpO2 (!) 89%   BMI 24.80 kg/m  Pain Assessment: 0-10   Pain Score: 5    SpO2: SpO2: (!) 89 % O2 Device:SpO2: (!) 89 % O2 Flow Rate: .O2 Flow Rate (L/min): 3 L/min  IO: Intake/output summary:   Intake/Output Summary (Last 24 hours) at 08/20/2017 1808 Last data filed at 08/20/2017 1500 Gross per 24 hour  Intake 1228.75 ml  Output 600 ml  Net 628.75 ml    LBM: Last BM Date: 08/18/17 Baseline Weight: Weight: 63.5 kg (140 lb) Most recent weight: Weight: 63.5 kg (140 lb)     Palliative Assessment/Data:   Flowsheet Rows     Most Recent Value  Intake Tab  Referral Department  Hospitalist  Unit at Time of Referral  Med/Surg Unit  Palliative Care Primary Diagnosis  Cardiac  Date Notified  08/20/17  Palliative Care Type  New Palliative care  Reason Not Seen  -- [No PMT needs]  Reason for referral  Non-pain Symptom, Pain, Clarify Goals of Care  Date of Admission  08/20/2017  Date first seen by Palliative Care  08/20/17  # of days Palliative referral response time  0 Day(s)  # of days IP prior to Palliative referral  7  Clinical Assessment  Palliative Performance Scale Score  20%  Pain Max last 24 hours  Not able to report  Pain Min Last 24 hours  Not able to report  Dyspnea Max Last 24 Hours  Not able to report  Dyspnea Min Last 24 hours  Not able to report  Nausea Max Last 24 Hours  Not able to report  Nausea Min Last 24 Hours  Not able to report  Anxiety Max Last 24 Hours  Not able to  report  Anxiety Min Last 24 Hours  Not able to report  Other Max Last 24 Hours  Not able to report  Psychosocial & Spiritual Assessment  Palliative Care Outcomes  Patient/Family meeting held?  Yes  Who was at the meeting?  daughter, granddaughter, son  Palliative Care Outcomes  Improved pain interventions, Clarified goals of care, Provided end of life care assistance, Provided psychosocial or spiritual support, Counseled regarding hospice  Patient/Family wishes: Interventions discontinued/not started   Transfusion, Hemodialysis, BiPAP, Mechanical Ventilation, Vasopressors, NIPPV, Antibiotics, Trach, PEG, Tube feedings/TPN  Palliative Care follow-up planned  Yes, Facility      Time In: 1600  Time Out: 1710 Time Total: 70 min Greater than 50%  of this time was spent counseling and coordinating care related to the above assessment and plan.  Signed by: Dory Horn, NP   Please contact Palliative Medicine Team phone at [phone] for questions and concerns.  For individual provider: See Shea Evans

## 2017-09-12 NOTE — Progress Notes (Addendum)
   09/02/17 0000  Attending [place]  Attending Physician Notified Y  Attending Physician Name Arby Barrette  Will the above attending physician sign death certificate?  (non ED physician) Yes  Post Mortem Checklist  Date of Death 09/02/17  Time of Death 09/22/2051  Pronounced By Nellie RN and Thess, RN  Next of kin notified Yes  Name of next of kin notified of death Unknown [place]  Contact Person's Relationship to Patient Daughter  Contact Person's Phone Number [phone]  Contact Person's address [address], [place]  Was the patient a No Code Blue or a Limited Code Blue? Yes  Did the patient die unattended? No  Patient restrained? Not applicable  Weight 32.9 kg (140 lb 0 oz)  Body preparation complete Y  [place] [facility]  Notification Date 09/02/2017  Notification Time [place] [facility] Number [phone]  Is patient a potential donor? N  Autopsy  Autopsy requested by N/A  Patient Belongings/Medications Returned  Patient belongings from bedside/safe/pharmacy returned  Yes  Valuables returned to? [place]  Is this a medical examiner's case? [place] home name/address/phone # [place] [phone]  Planned location of pickup [place]   Addendum, time of death is 0048 not [phone].

## 2017-09-12 NOTE — Progress Notes (Signed)
During rounds, pt was noted to not breathing and without pulse. Verified with another RN. Daughter is at bedside.

## 2017-09-12 NOTE — Death Summary Note (Signed)
DEATH SUMMARY   Patient Details  Name: Rebecca [name] MRN: [id number] DOB: [birth year]  Admission/Discharge Information   Admit Date:  September 09, 2017  Date of Death: Date of Death: 2017/09/17  Time of Death: Time of Death: 10-07-2046  Length of Stay: 8  Referring Physician: [name], MD   Reason(s) for Hospitalization  Nausea vomiting of brown emesis with chest discomfort and found to have ileus  Diagnoses  Preliminary cause of death:  Secondary Diagnoses (including complications and co-morbidities):  Principal Problem:   Ileus ([place]) Active Problems:   Constipation, chronic   Abnormal urinalysis   Dehydration   Elevated troponin   Diastolic heart failure, NYHA class 1 (HCC)   Hypothyroidism, adult   Current chronic use of systemic steroids   COPD (chronic obstructive pulmonary disease) (HCC)   CAD (coronary artery disease)   CKD (chronic kidney disease), stage IV [employer] [facility])   Brief Hospital Course (including significant findings, care, treatment, and services provided and events leading to death)  Rebecca [name] is a 77 y.o. year old female medical history significant for COPD, PMR, CAD, CVA, mild esophageal dysmotility, recently transition to home with hospice (04/2017) given guarded prognosis of prior comorbidities who presented on Sep 09, 2017 with chest pain, N/V and was found to have constipation related to ileus   CT abdomen and repeat abdominal x-rays were consistent with ileus of both the large colon and small bowel with stool burden and no signs of bowel obstruction.  No improvement in ileus with supportive care and optimization of bowel regimen.  Patient was also treated with IV fluids for tachycardia bleed related to poor hydration, and Keflex for UTI secondary to E. coli.  Patient was found to have elevated troponin of 0.19 on admission.  EKG without any ischemic changes, daughter declined further workup given no desire for acute intervention.  She was continued on her home Plavix,  Toprol and pravastatin.  Patient tolerated liquid diet but continued to complain of some discomfort with swallowing, she was empirically treated with Diflucan given chronic prednisone use.  GI was consulted and patient was placed on NG tube with low intermittent suction and made n.p.o. but patient still had very minimal improvement.  Repeat abdominal imaging showed persistent ileus with no signs of bowel obstruction.    Unfortunately, on 3/9 patient was found to have acute change in her respiratory status requiring high levels of oxygen via nonrebreather mask.  Patient's work of breathing was significantly increase with obvious use of respiratory accessory muscles.  Given her mother's worsening respiratory condition, daughter elected to pursue comfort care measures.  She made it clear that her mother had shared with her her desire to not be intubated in the event of worsening respiratory status.  Comfort care orders were placed.  IV morphine was used for PRN dyspnea.  Palliative care was reconsulted to assist.  During nursing rounds patient was noted to be not breathing without pulse.      Pertinent Labs and Studies  Significant Diagnostic Studies Ct Abdomen Pelvis Wo Contrast  Result Date: 08/14/2017 CLINICAL DATA:  Abdominal distension. Vomiting. Abdominal pain. Hospice patient. EXAM: CT ABDOMEN AND PELVIS WITHOUT CONTRAST TECHNIQUE: Multidetector CT imaging of the abdomen and pelvis was performed following the standard protocol without IV contrast. COMPARISON:  Radiographs earlier this day.  CT 01/16/2017 FINDINGS: Lower chest: Left basilar consolidation with high-density material in the lower lobe bronchi. Small left pleural effusion. Minimal patchy opacity in the medial right lower lobe. There are coronary artery calcifications.  Small pericardial effusion. Hepatobiliary: No focal lesion allowing for lack contrast. Clips in the gallbladder fossa postcholecystectomy. No biliary dilatation.  Pancreas: Parenchymal atrophy. No ductal dilatation or inflammation. Cystic lesion in the pancreatic body is unchanged measuring 2.8 x 2 cm. Spleen: Normal in size without focal abnormality. Adrenals/Urinary Tract: No adrenal nodule. Bilateral renal parenchymal thinning. No hydronephrosis. Simple and hemorrhagic cysts both kidneys are unchanged from prior exam. Urinary bladder is distended and contains an air-fluid level. No bladder wall thickening. Stomach/Bowel: Chronic colonic tortuosity and moderate stool burden. There is more liquid stool in the ascending colon. No small bowel dilatation or obstruction. Enteric contrast reaches the mid small bowel. No evidence of bowel inflammation. Vascular/Lymphatic: Again seen aortic atherosclerosis. No aneurysm. No bulky adenopathy. Reproductive: Post hysterectomy.  No adnexal mass. Other: Small to moderate simple free fluid in the pelvis without organized collection. No free air. There is body wall edema. Musculoskeletal: Multilevel degenerative change in the spine with peripherally calcified disc extrusion at L2-L3 causing mass effect on the spinal canal, unchanged. IMPRESSION: 1. Colonic tortuosity with large colonic stool burden, similar to prior CT, suggesting constipation. No bowel obstruction. No bowel inflammation. 2. Left lower lobe consolidation with high-density material in the lower lobe bronchi are, suspicious for aspiration, possible aspiration of CT contrast. Patchy right lower lobe opacities, also likely aspiration. 3. Small to moderate simple free fluid in the pelvis is likely reactive, but nonspecific. No organized collection or abscess. 4. 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 5.  Aortic Atherosclerosis (ICD10-I70.0). Electronically Signed   By: Jeb Levering M.D.   On: 08/14/2017 00:26   Dg Chest 2 View  Result Date: 09/05/2017 CLINICAL DATA:  Chest pain EXAM: CHEST  2 VIEW COMPARISON:  May 07, 2007 2  FINDINGS: The cardiomediastinal silhouette is stable. No pneumothorax. The right lung is clear. Mild opacity seen in the left base with blunting of left costophrenic angle. There is opacity in this region on the November 2018 studies. IMPRESSION: 1. There is opacity in the left base which is improved since November 2018. Whether this recommends scarring from a previous infiltrate or redevelopment of a new infiltrate is unclear. Recommend attention on follow-up. No other acute abnormalities. Electronically Signed   By: Dorise Bullion III M.D   On: 09/01/2017 07:39   Dg Abd 1 View  Result Date: 08/20/2017 CLINICAL DATA:  Ileus EXAM: ABDOMEN - 1 VIEW COMPARISON:  08/19/2017 FINDINGS: NG tube tip is in the proximal stomach. Diffuse gaseous distention of bowel, most pronounced throughout the colon compatible with ileus. No change since prior study. Prior cholecystectomy. No visible organomegaly or free air. IMPRESSION: Stable ileus pattern. Electronically Signed   By: Rolm Baptise M.D.   On: 08/20/2017 08:56   Dg Chest Port 1 View  Result Date: 08/16/2017 CLINICAL DATA:  Cough, shortness of breath. EXAM: PORTABLE CHEST 1 VIEW COMPARISON:  Radiographs of August 13, 2017. FINDINGS: Stable cardiomediastinal silhouette. Atherosclerosis of thoracic aorta is noted. No pneumothorax is noted. Right lung is clear. Increased left basilar opacity is noted concerning for worsening atelectasis or infiltrate with mild associated pleural effusion. Bony thorax is unremarkable. IMPRESSION: Increased left basilar opacity is noted concerning for worsening atelectasis or infiltrate with mild associated pleural effusion. Aortic Atherosclerosis (ICD10-I70.0). Electronically Signed   By: Marijo Conception, M.D.   On: 08/16/2017 12:56   Dg Abd 2 Views  Result Date: 08/19/2017 CLINICAL DATA:  Abdominal pain, distention, constipation EXAM: ABDOMEN -  2 VIEW COMPARISON:  None. FINDINGS: There is gaseous distention of small bowel and colon.  There is no evidence of pneumoperitoneum, portal venous gas or pneumatosis. There are no pathologic calcifications along the expected course of the ureters. The osseous structures are unremarkable. IMPRESSION: Gaseous distention of small bowel and colon concerning for an ileus. Electronically Signed   By: Kathreen Devoid   On: 08/19/2017 14:19   Dg Abd 2 Views  Result Date: 08/27/2017 CLINICAL DATA:  Vomiting EXAM: ABDOMEN - 2 VIEW COMPARISON:  April 27, 2017 FINDINGS: Moderate fecal loading in the colon. The distal transverse colon is distended up to 8.7 cm. There is gas extending to the level of the rectum. Air-filled prominent loops of small bowel are seen in the left side of the abdomen measuring up to 4 cm. No free air, portal venous gas, or pneumatosis. No other acute abnormalities. IMPRESSION: 1. Mildly dilated loops of small bowel are seen in the left abdomen. Air-filled dilated transverse colon is seen with gas extending to the rectum. The findings could represent ileus or early obstruction. Recommend clinical correlation and follow-up as clinically warranted. Electronically Signed   By: Dorise Bullion III M.D   On: 09/01/2017 07:41   Dg Abd Portable 1v  Result Date: 08/19/2017 CLINICAL DATA:  Encounter for nasogastric tube placement. EXAM: PORTABLE ABDOMEN - 1 VIEW COMPARISON:  08/19/2017 FINDINGS: New nasogastric tube with tip and side-port over the stomach. Diffuse gaseous distension of small and large bowel. No evidence of pneumatosis. No visible pneumoperitoneum. Cholecystectomy clips. IMPRESSION: 1. Nasogastric tube tip and side-port overlaps the stomach. 2. Unchanged diffuse gaseous distension of bowel suggesting ileus. Electronically Signed   By: Monte Fantasia M.D.   On: 08/19/2017 19:09    Microbiology Recent Results (from the past 240 hour(s))  Blood Culture (routine x 2)     Status: None   Collection Time: 08/17/2017  8:22 AM  Result Value Ref Range Status   Specimen Description  BLOOD RIGHT ANTECUBITAL  Final   Special Requests   Final    BOTTLES DRAWN AEROBIC AND ANAEROBIC Blood Culture adequate volume   Culture   Final    NO GROWTH 5 DAYS Performed at [facility], [address], [place], [place] [address]    Report Status 08/18/2017 FINAL  Final  Blood Culture (routine x 2)     Status: None   Collection Time: 08/22/2017  8:23 AM  Result Value Ref Range Status   Specimen Description BLOOD LEFT FOREARM  Final   Special Requests   Final    BOTTLES DRAWN AEROBIC AND ANAEROBIC Blood Culture adequate volume   Culture   Final    NO GROWTH 5 DAYS Performed at [facility], [place] [address], [place], [place] [address]    Report Status 08/18/2017 FINAL  Final  Culture, Urine     Status: Abnormal   Collection Time: 08/15/2017  2:30 PM  Result Value Ref Range Status   Specimen Description URINE, RANDOM  Final   Special Requests   Final    NONE Performed at [facility], [place] [address], [place],  [address]    Culture >=100,000 COLONIES/mL ESCHERICHIA COLI (A)  Final   Report Status 08/15/2017 FINAL  Final   Organism ID, Bacteria ESCHERICHIA COLI (A)  Final      Susceptibility   Escherichia coli - MIC*    AMPICILLIN >=32 RESISTANT Resistant     CEFAZOLIN <=4 SENSITIVE Sensitive  CEFTRIAXONE <=1 SENSITIVE Sensitive     CIPROFLOXACIN >=4 RESISTANT Resistant     GENTAMICIN >=16 RESISTANT Resistant     IMIPENEM <=0.25 SENSITIVE Sensitive     NITROFURANTOIN 256 RESISTANT Resistant     TRIMETH/SULFA >=320 RESISTANT Resistant     AMPICILLIN/SULBACTAM 4 SENSITIVE Sensitive     PIP/TAZO <=4 SENSITIVE Sensitive     Extended ESBL NEGATIVE Sensitive     * >=100,000 COLONIES/mL ESCHERICHIA COLI    Lab Basic Metabolic Panel: Recent Labs  Lab 08/14/17 0645 08/16/17 0624 08/17/17 0623 08/20/17 0720  NA 137 137 136 140  K 4.4 4.7 4.3 3.5  CL 110 112* 111 112*  CO2 19* 18* 19* 18*  GLUCOSE 94 80 87 78  BUN 24* 22* 19 21*  CREATININE  1.27* 1.06* 1.00 1.21*  CALCIUM 7.6* 7.3* 7.3* 6.5*  MG  --   --  1.6*  --    Liver Function Tests: Recent Labs  Lab 08/16/17 0624  AST 22  ALT 6*  ALKPHOS 43  BILITOT 0.9  PROT 4.0*  ALBUMIN 2.0*   No results for input(s): LIPASE, AMYLASE in the last 168 hours. No results for input(s): AMMONIA in the last 168 hours. CBC: Recent Labs  Lab 08/14/17 0645 08/16/17 0624 08/17/17 0623 08/19/17 0745 08/20/17 0720  WBC 22.0* 14.0* 25.6* 20.1* 15.1*  NEUTROABS  --  11.6*  --   --   --   HGB 8.2* 8.9* 9.1* 9.2* 8.2*  HCT 24.7* 27.1* 28.1* 27.8* 25.5*  MCV 93.6 93.8 91.5 93.0 92.7  PLT 184 207 213 165 175   Cardiac Enzymes: No results for input(s): CKTOTAL, [name], [name], TROPONINI in the last 168 hours. Sepsis Labs: Recent Labs  Lab 08/16/17 0624 08/17/17 0623 08/19/17 0745 08/20/17 0720  WBC 14.0* 25.6* 20.1* 15.1*    Procedures/Operations    Sinclaire Artiga D Lamarcus Spira 09-12-17, 6:27 AM

## 2017-09-12 DEATH — deceased

## 2017-10-10 ENCOUNTER — Ambulatory Visit: Payer: Medicare Other | Admitting: Hematology and Oncology

## 2017-10-10 ENCOUNTER — Other Ambulatory Visit: Payer: Medicare Other

## 2018-03-17 IMAGING — CT CT HEAD W/O CM
3 of 4 series · 12 of 47 positions shown, 14 images · non-contrast
Comparison: Multiple exams, including 11/12/2014 and 10/16/2013

CLINICAL DATA: Fall with scalp hematoma. Anti coagulation.
Posterior headache.

EXAM:
CT HEAD WITHOUT CONTRAST
TECHNIQUE: Contiguous axial images were obtained from the base of the skull
through the vertex without intravenous contrast.

[Series 2: head without ax · axial · non-contrast · 0.29mm/px · z∈[+1116,+1234]mm · 7 of 32 slices shown, 9 images]
[im 4/32  brain]
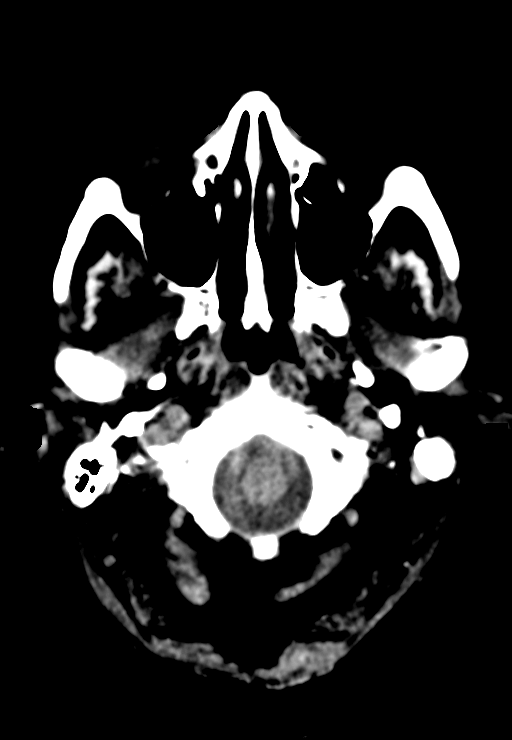
[im 4/32  bone]
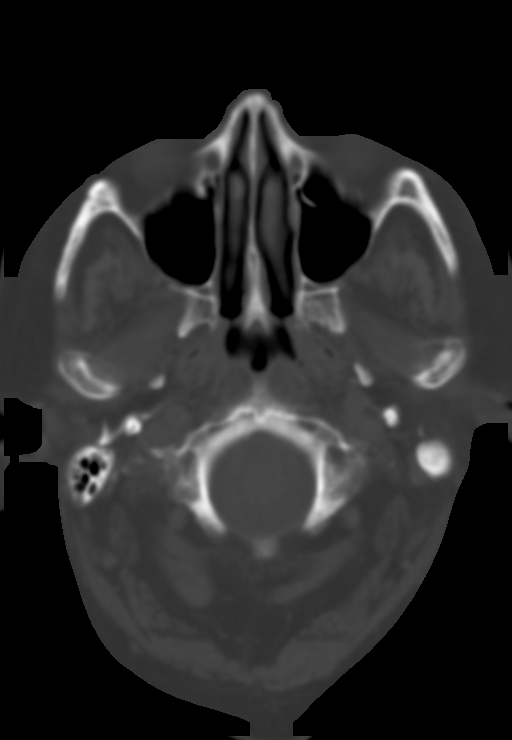
[im 8/32  brain]
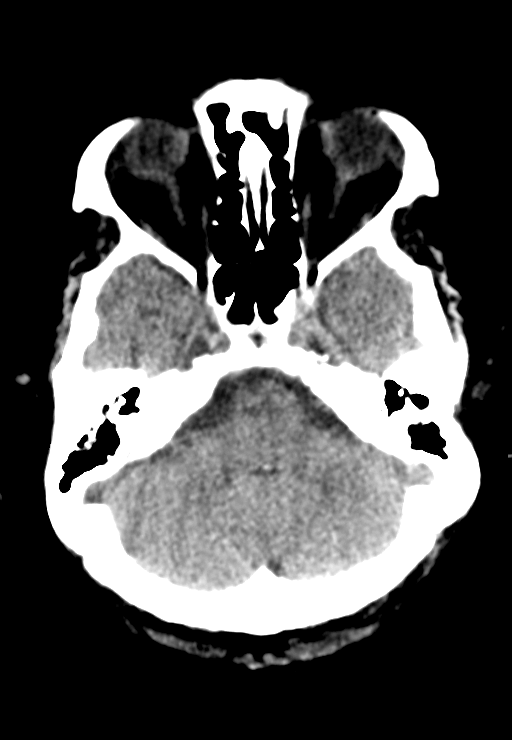
[im 12/32  brain]
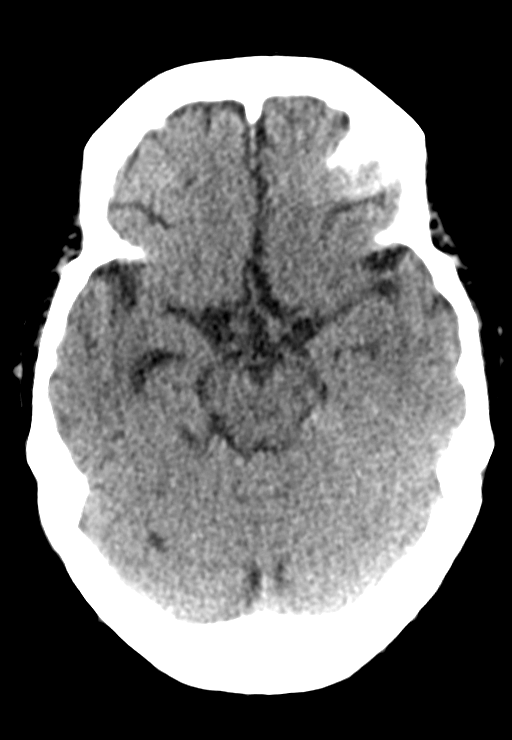
[im 16/32  brain]
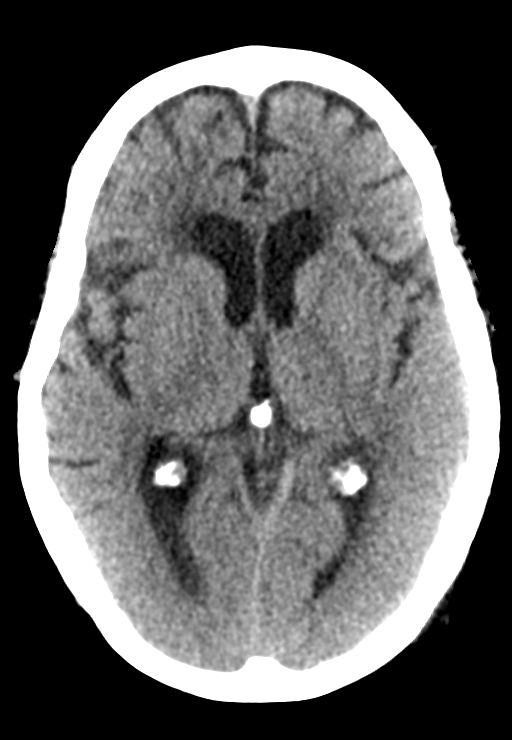
[im 20/32  brain]
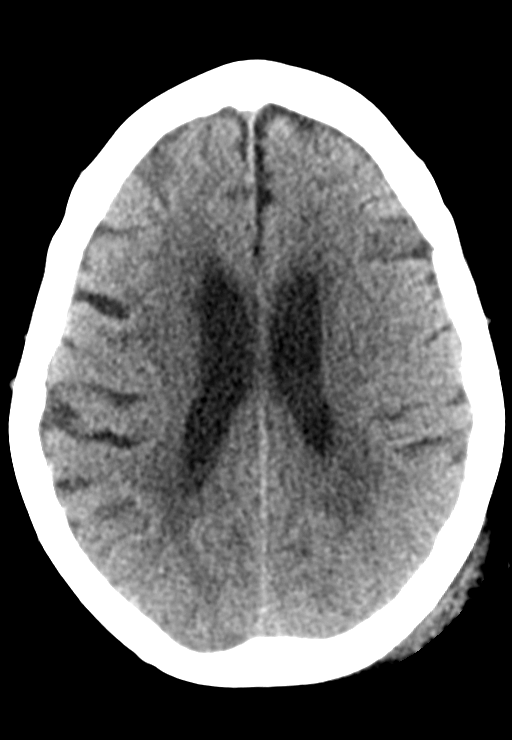
[im 20/32  bone]
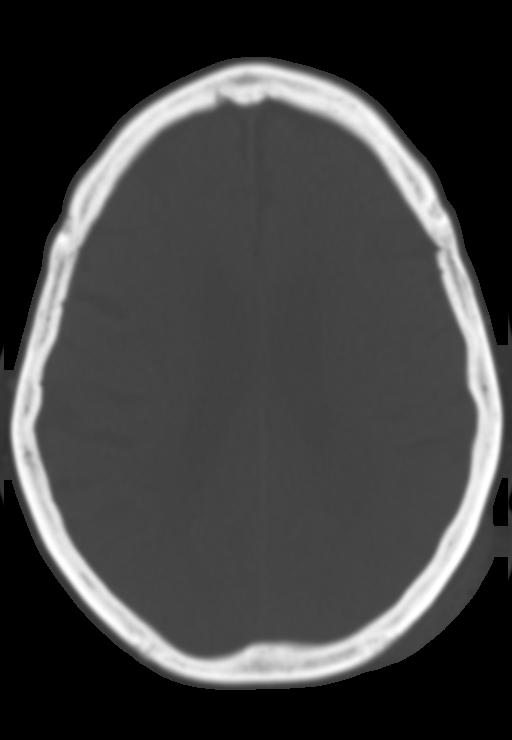
[im 24/32  brain]
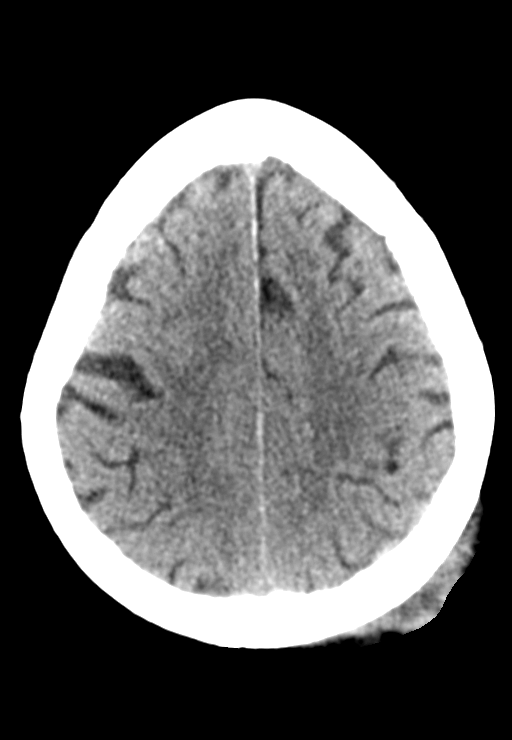
[im 28/32  brain]
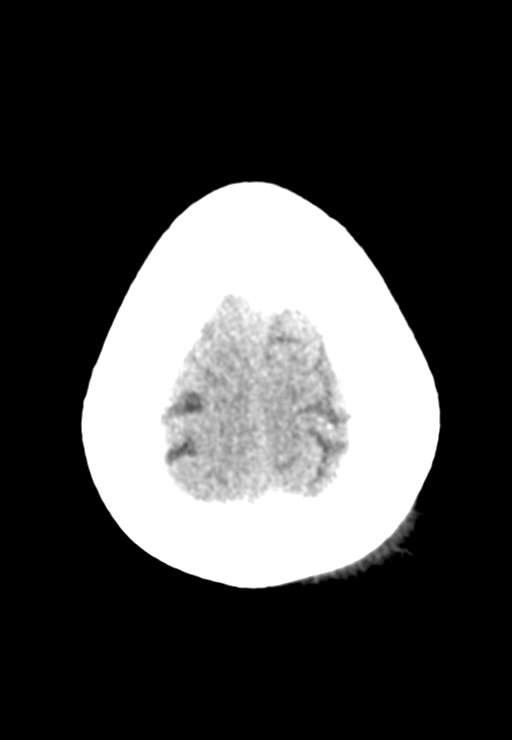

[Series 3: ax head bone · axial · 0.32mm/px · z∈[+1118,+1134]mm · 2 of 78 slices shown]
[im 8/78  bone]
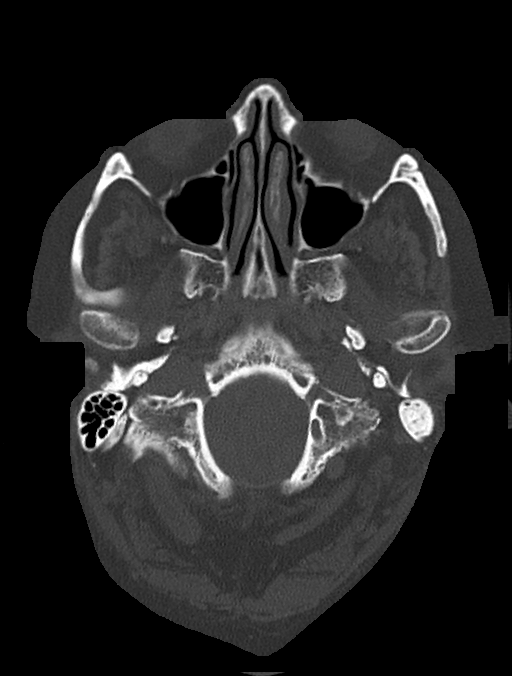
[im 16/78  bone]
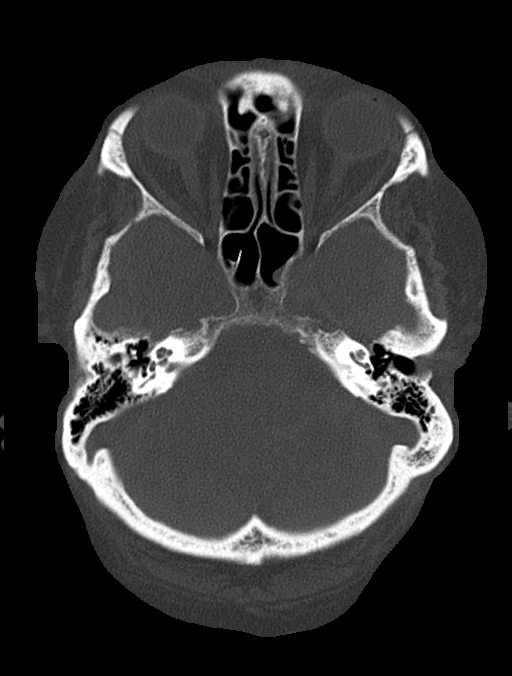

[Series 4: head without cor · coronal · non-contrast · 0.28mm/px · 3 of 67 slices shown]
[im 23/67  brain]
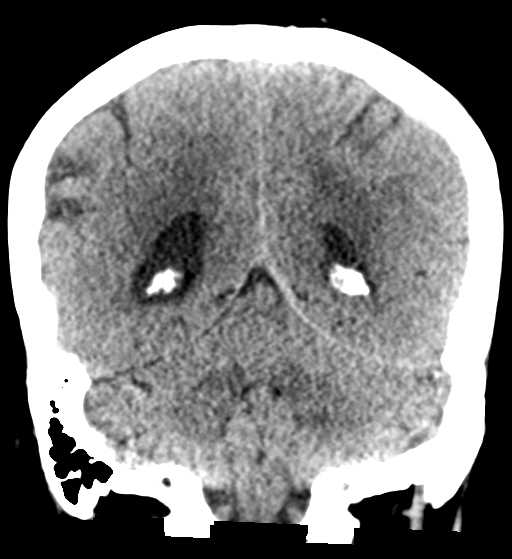
[im 30/67  brain]
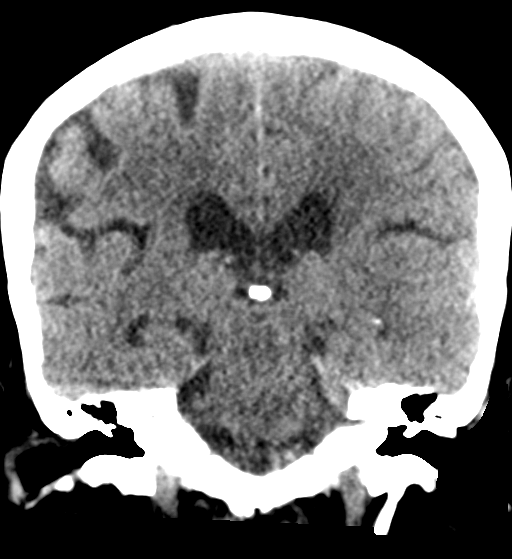
[im 37/67  brain]
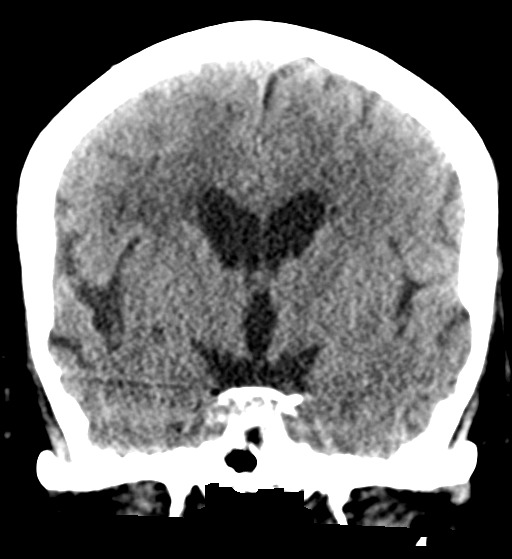

[12 of 47 positions shown; findings below may reference images not displayed]

FINDINGS: Brain: Remote small linear lacunar infarct in the right cerebellum,
image [DATE], stable.

Periventricular white matter and corona radiata hypodensities favor
chronic ischemic microvascular white matter disease.

Otherwise, the brainstem, cerebellum, cerebral peduncles, thalami,
basal ganglia, basilar cisterns, and ventricular system appear
within normal limits. No intracranial hemorrhage, mass lesion, or
acute CVA.

Vascular: Unremarkable

Skull: Unremarkable

Sinuses/Orbits: Mild chronic left sphenoid sinusitis. Small left
mastoid effusion. No orbital abnormality seen.

Other: Left parietal scalp hematoma.
IMPRESSION: 1. No acute intracranial findings.
2. Left parietal scalp hematoma.
3. Small left mastoid effusion. Mild chronic left sphenoid
sinusitis.
4. Periventricular white matter and corona radiata hypodensities
favor chronic ischemic microvascular white matter disease.
5. Small remote lacunar infarct in the right cerebellum, unchanged.

## 2018-06-14 IMAGING — CT CT HEAD W/O CM
3 of 4 series · 18 of 47 positions shown, 21 images · non-contrast
Comparison: 04/16/2016

CLINICAL DATA: Altered mental status

EXAM:
CT HEAD WITHOUT CONTRAST
TECHNIQUE: Contiguous axial images were obtained from the base of the skull
through the vertex without intravenous contrast.

[Series 201: head w/o, idose (1) · axial · non-contrast · 0.43mm/px · z∈[+101,+231]mm · 12 of 32 slices shown, 15 images]
[im 3/32  brain]
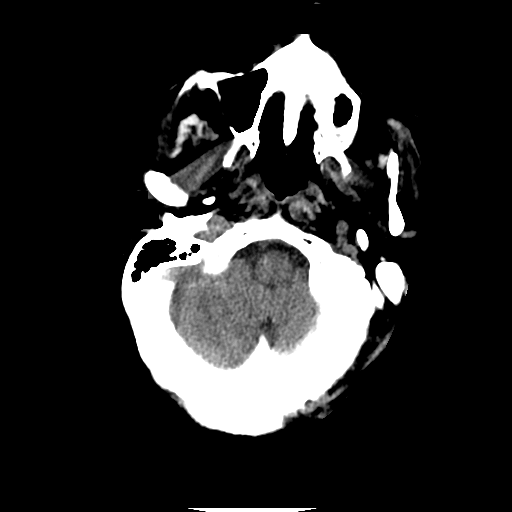
[im 3/32  bone]
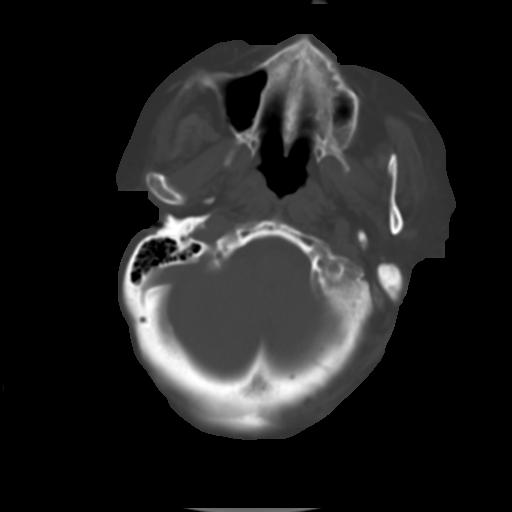
[im 5/32  brain]
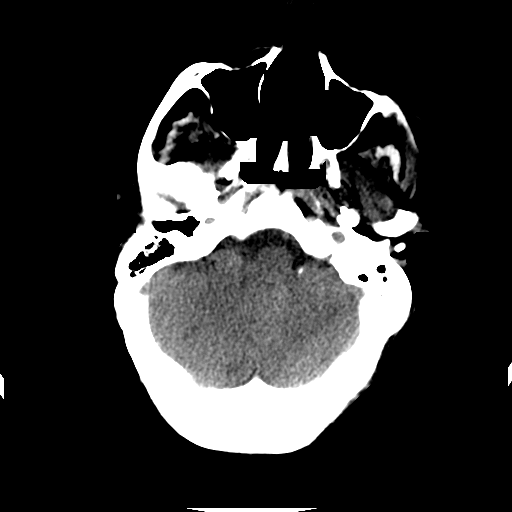
[im 7/32  brain]
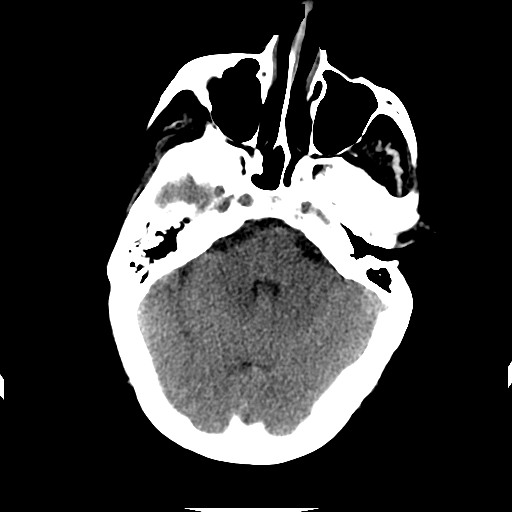
[im 9/32  brain]
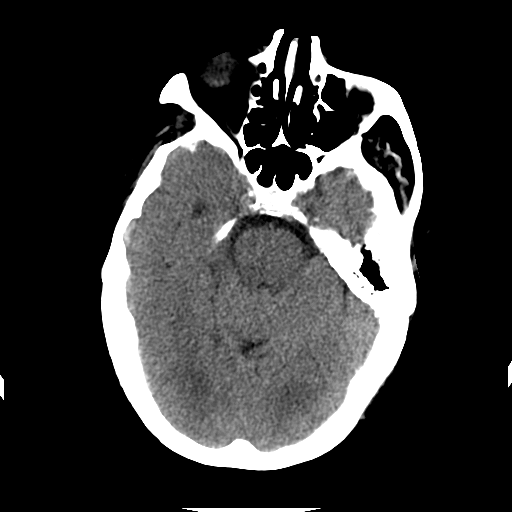
[im 12/32  brain]
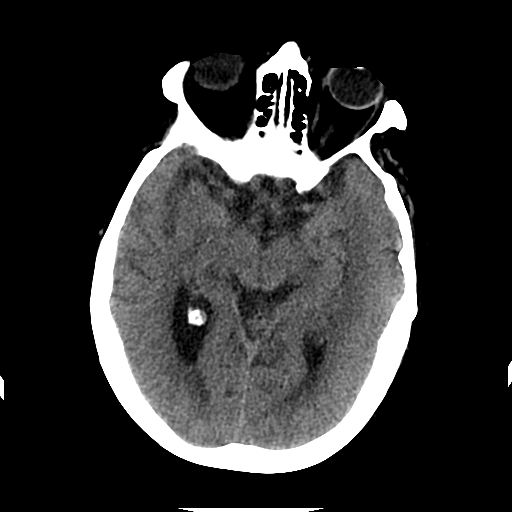
[im 12/32  bone]
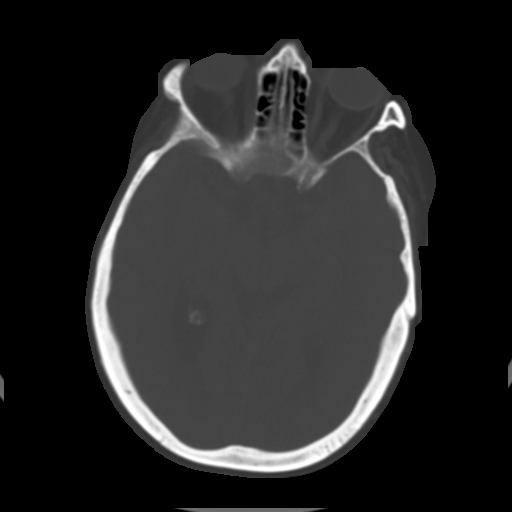
[im 14/32  brain]
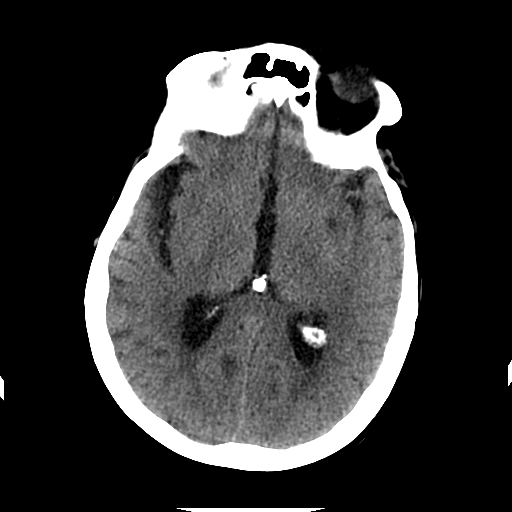
[im 18/32  brain]
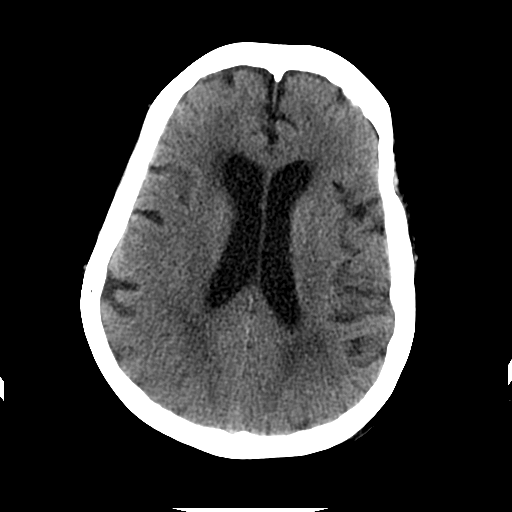
[im 20/32  brain]
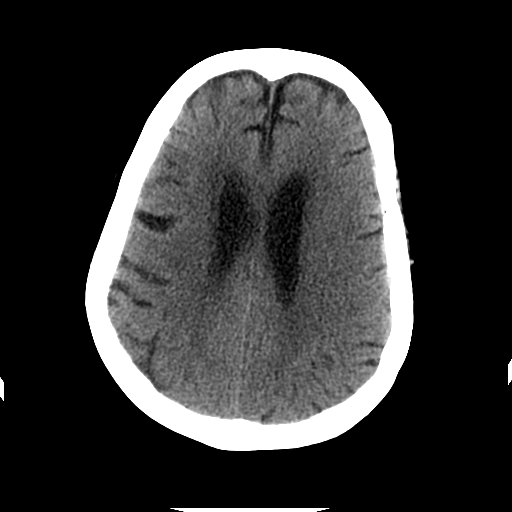
[im 23/32  brain]
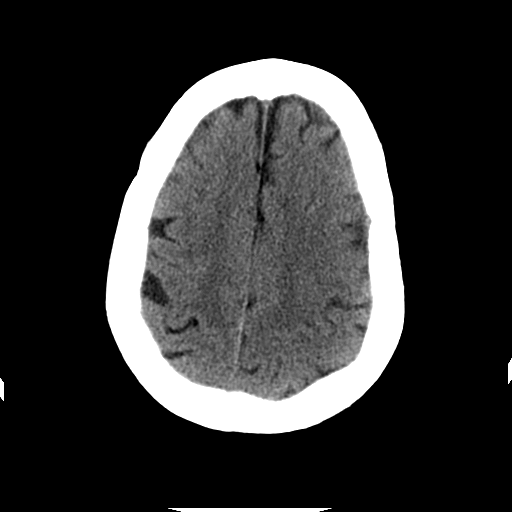
[im 23/32  bone]
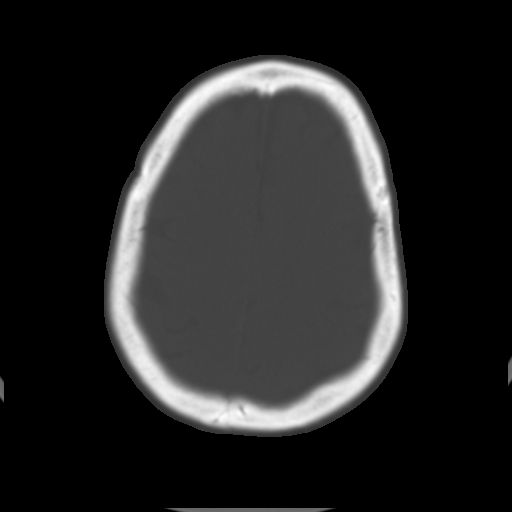
[im 25/32  brain]
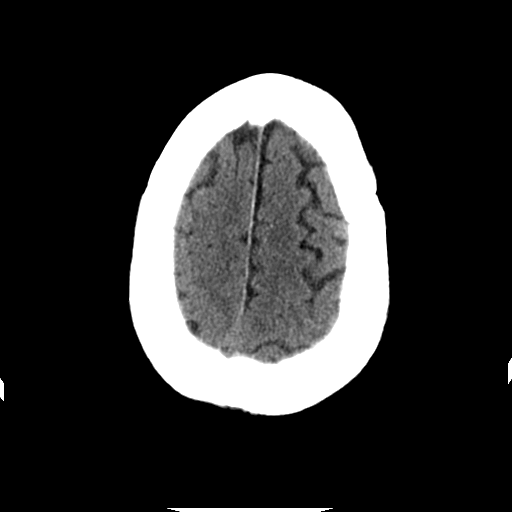
[im 27/32  brain]
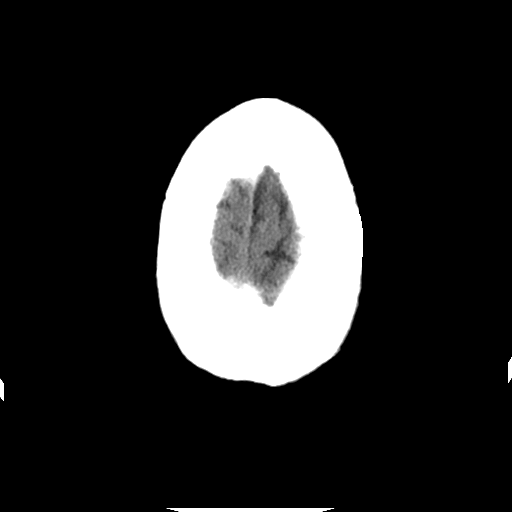
[im 29/32  brain]
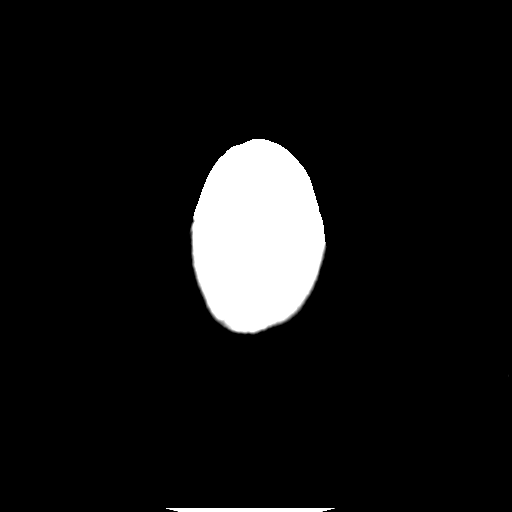

[Series 203: coronal st, idose (1) · coronal · 0.41mm/px · 3 of 71 slices shown]
[im 24/71  brain]
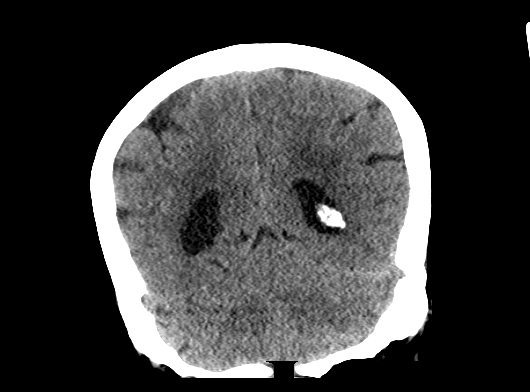
[im 32/71  brain]
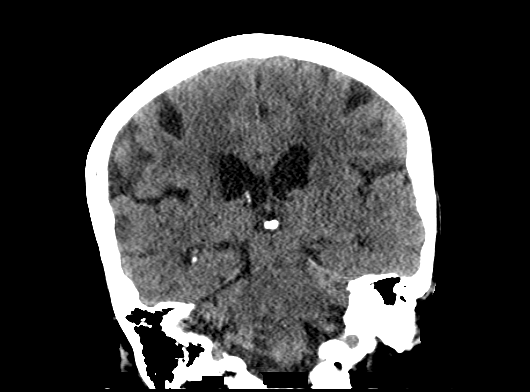
[im 39/71  brain]
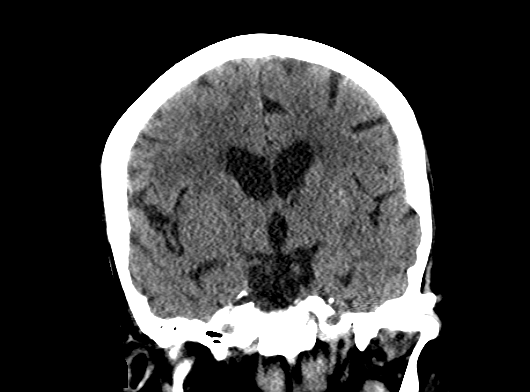

[Series 204: sagittal st, idose (1) · sagittal · 0.40mm/px · 3 of 72 slices shown]
[im 24/72  brain]
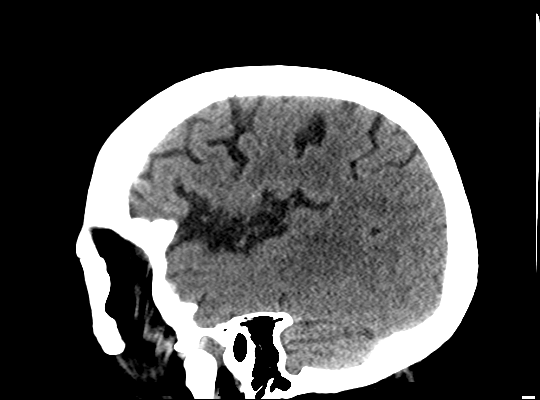
[im 36/72  brain]
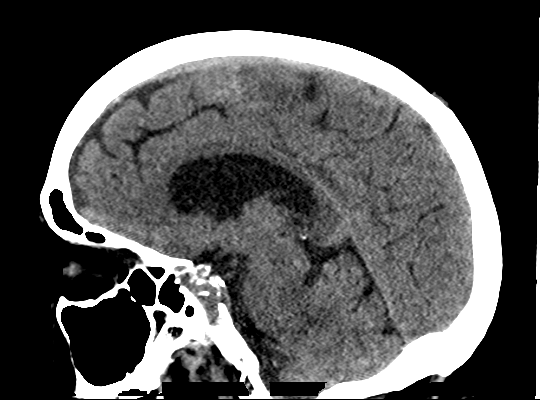
[im 48/72  brain]
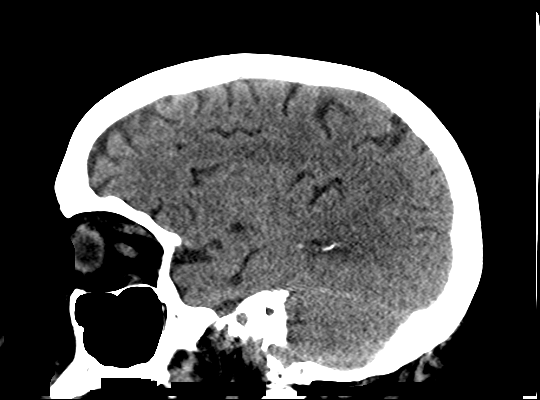

[18 of 47 positions shown; findings below may reference images not displayed]

FINDINGS: Brain: Mild atrophic changes and scattered chronic white matter
ischemic change is again identified. No findings to suggest acute
hemorrhage, acute infarction or space-occupying mass lesion are
noted.

Vascular: No hyperdense vessel or unexpected calcification.

Skull: Normal. Negative for fracture or focal lesion.

Sinuses/Orbits: No acute finding.

Other: None.
IMPRESSION: Chronic atrophic and ischemic changes without acute abnormality.

## 2018-06-14 IMAGING — CR DG CHEST 2V
2 series · 2 of 2 positions shown · non-contrast
Comparison: 10/16/2013 .

CLINICAL DATA: All noted status.  Fever.

EXAM:
CHEST  2 VIEW

[chest lat]
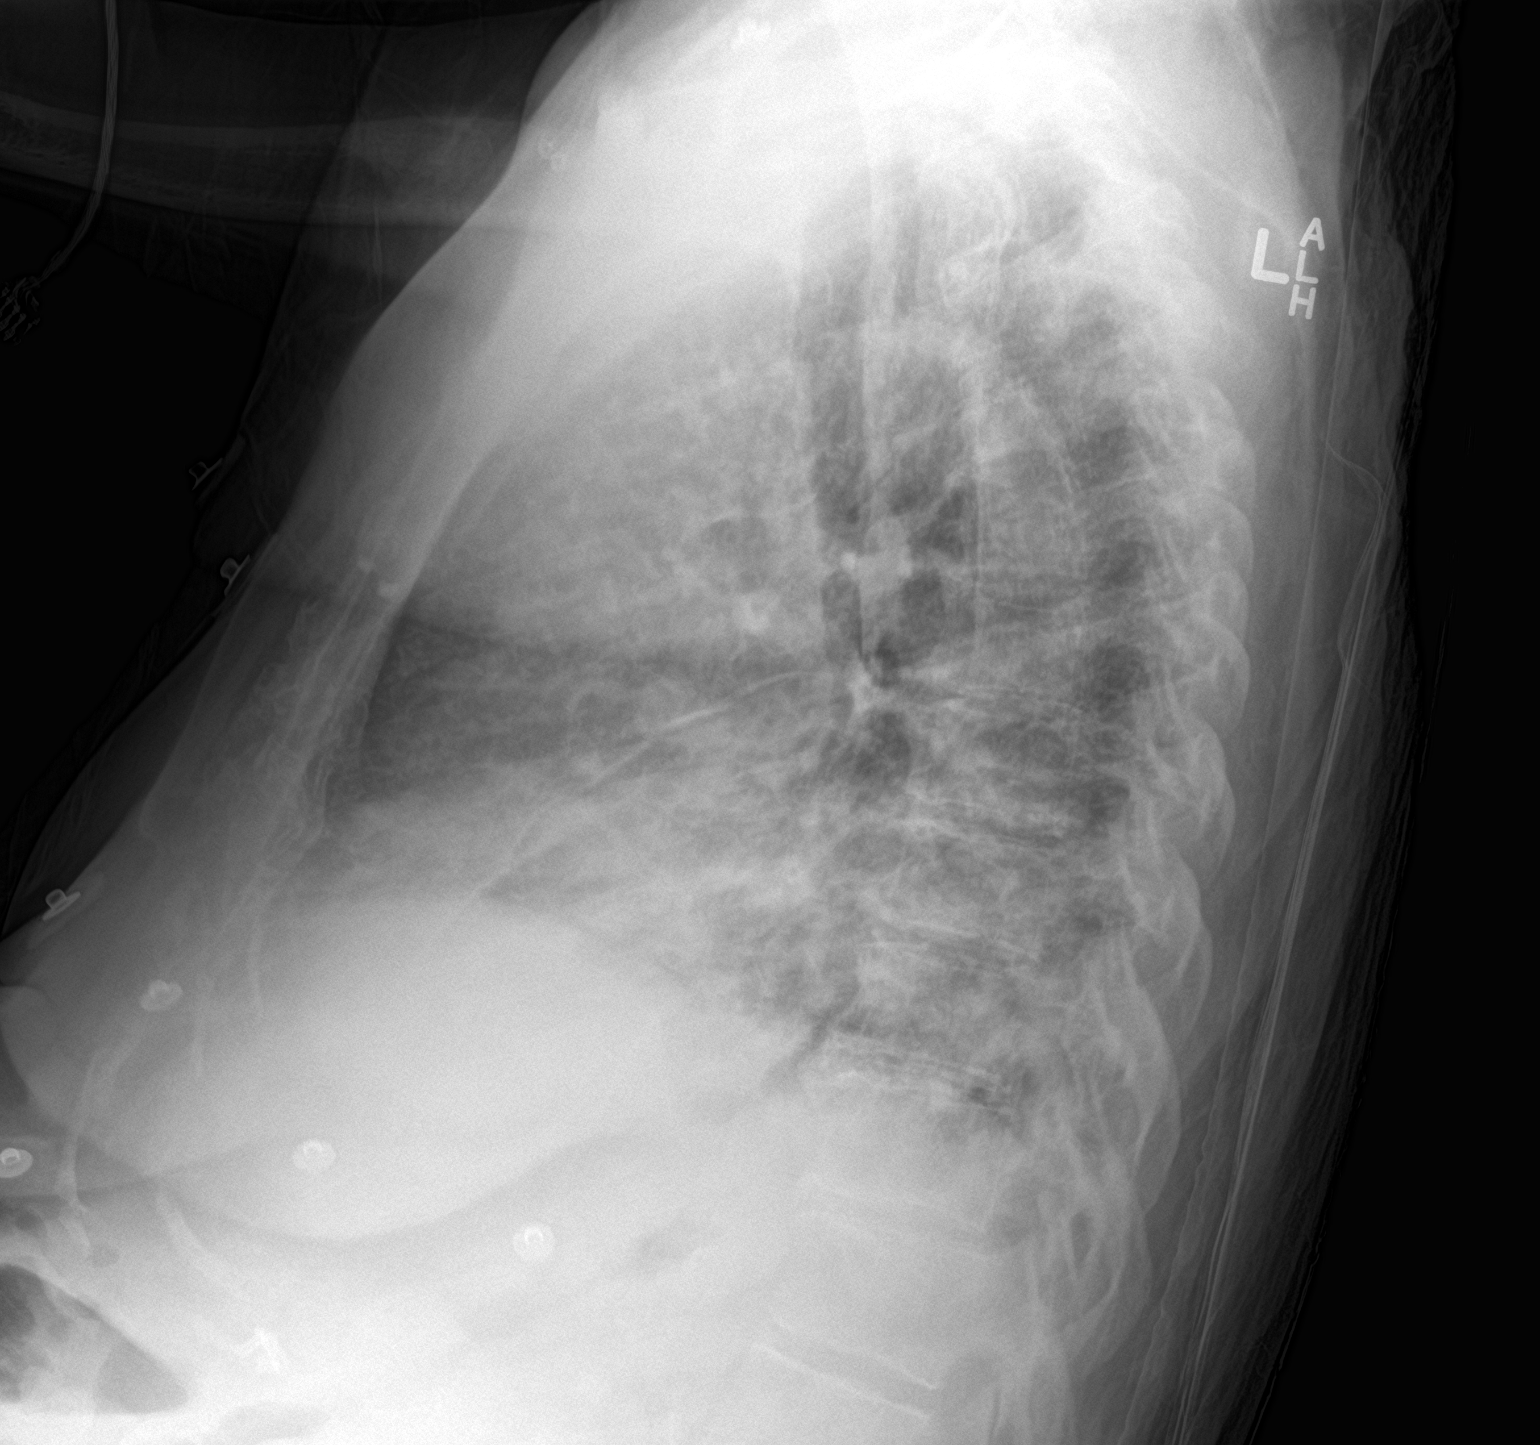

[chest ap]
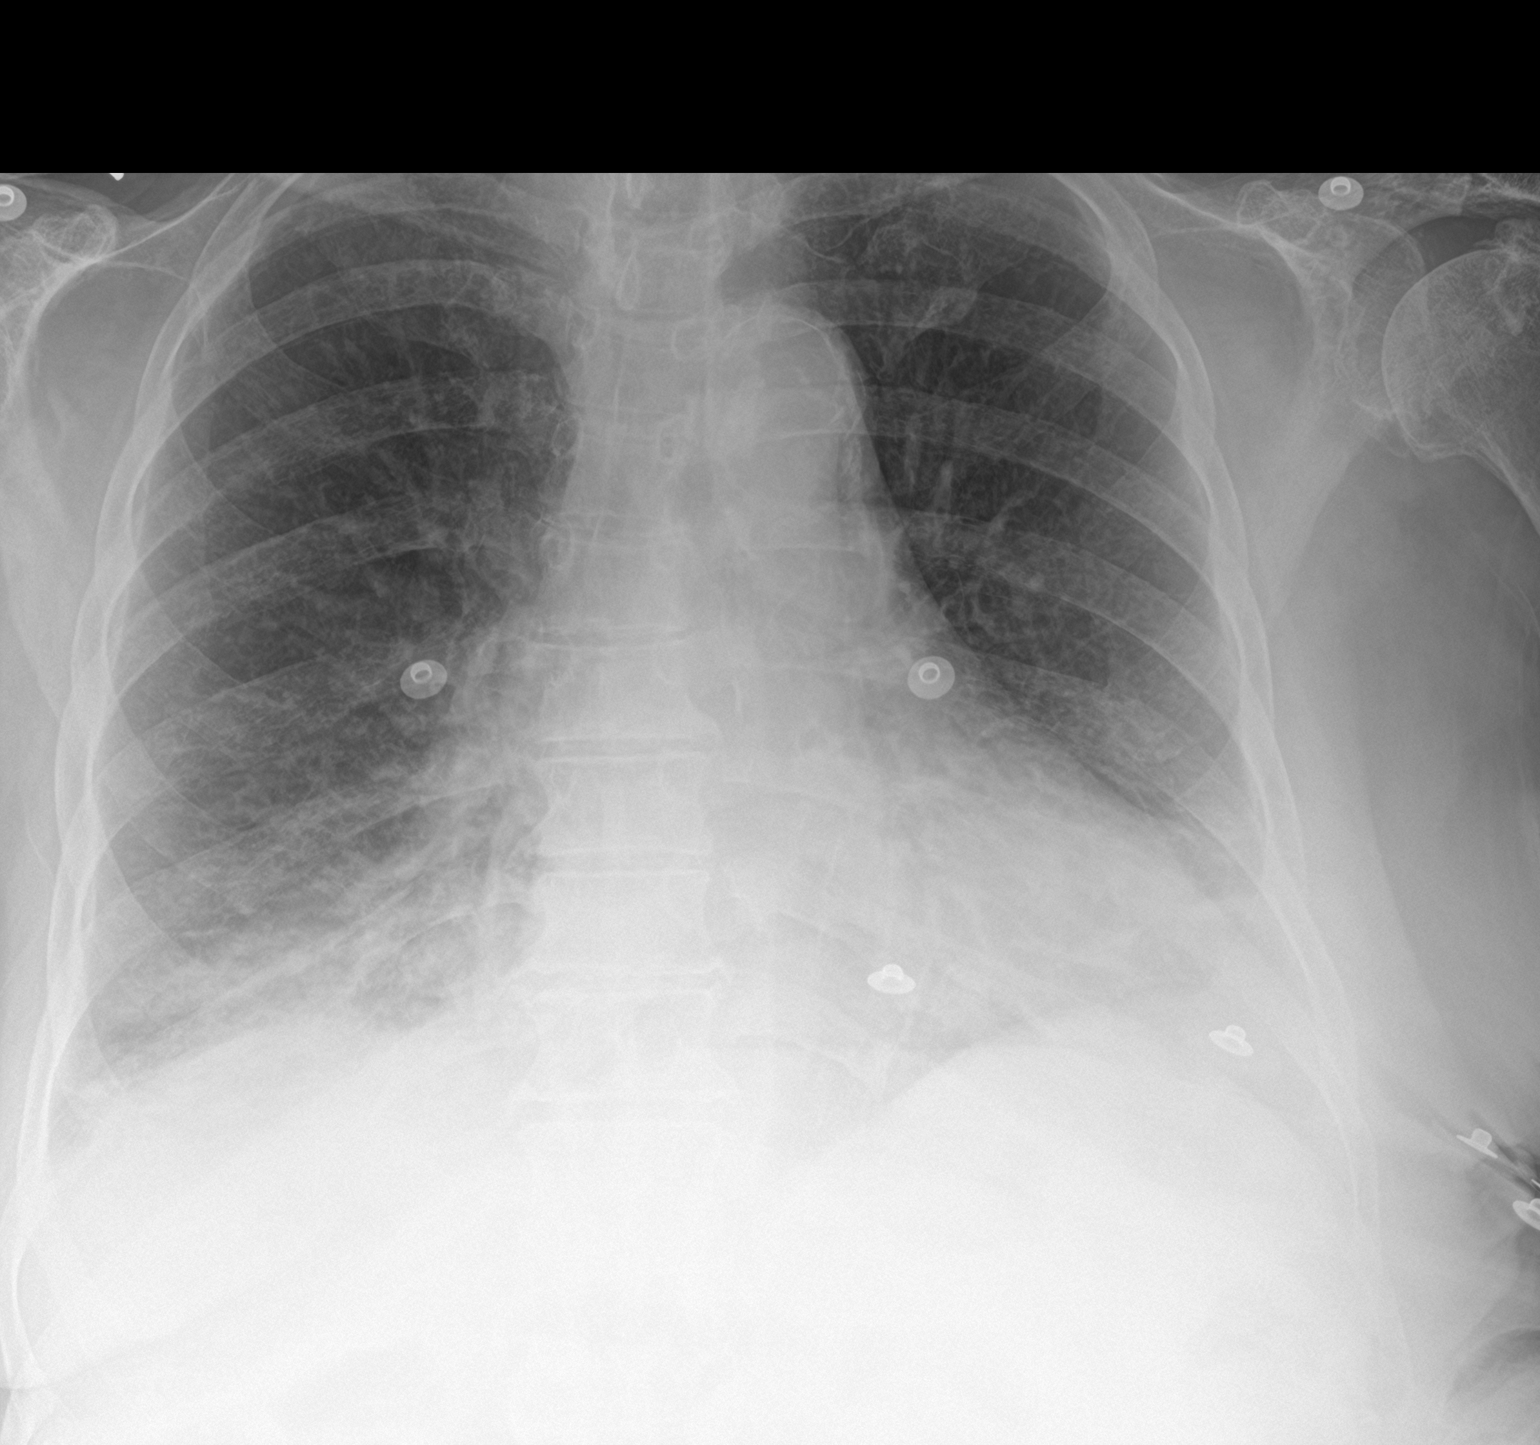

[2 of 2 positions shown; findings below may reference images not displayed]

FINDINGS: Mediastinum hilar structures normal. Cardiomegaly. Diffuse bilateral
from interstitial prominence with bilateral pleural effusions.
Findings suggest congestive heart failure. Bilateral pneumonitis
cannot be excluded. No pneumothorax .
IMPRESSION: 1. Cardiomegaly. Bilateral from interstitial prominence and
bilateral pleural effusions suggesting congestive heart failure.
Bilateral pneumonitis cannot be excluded .

## 2018-08-13 IMAGING — CR DG CHEST 1V
1 series · 1 of 1 positions shown · non-contrast
Comparison: 07/14/2016.

CLINICAL DATA: Productive cough.  Fell last night.

EXAM:
CHEST 1 VIEW

[x chest ap]
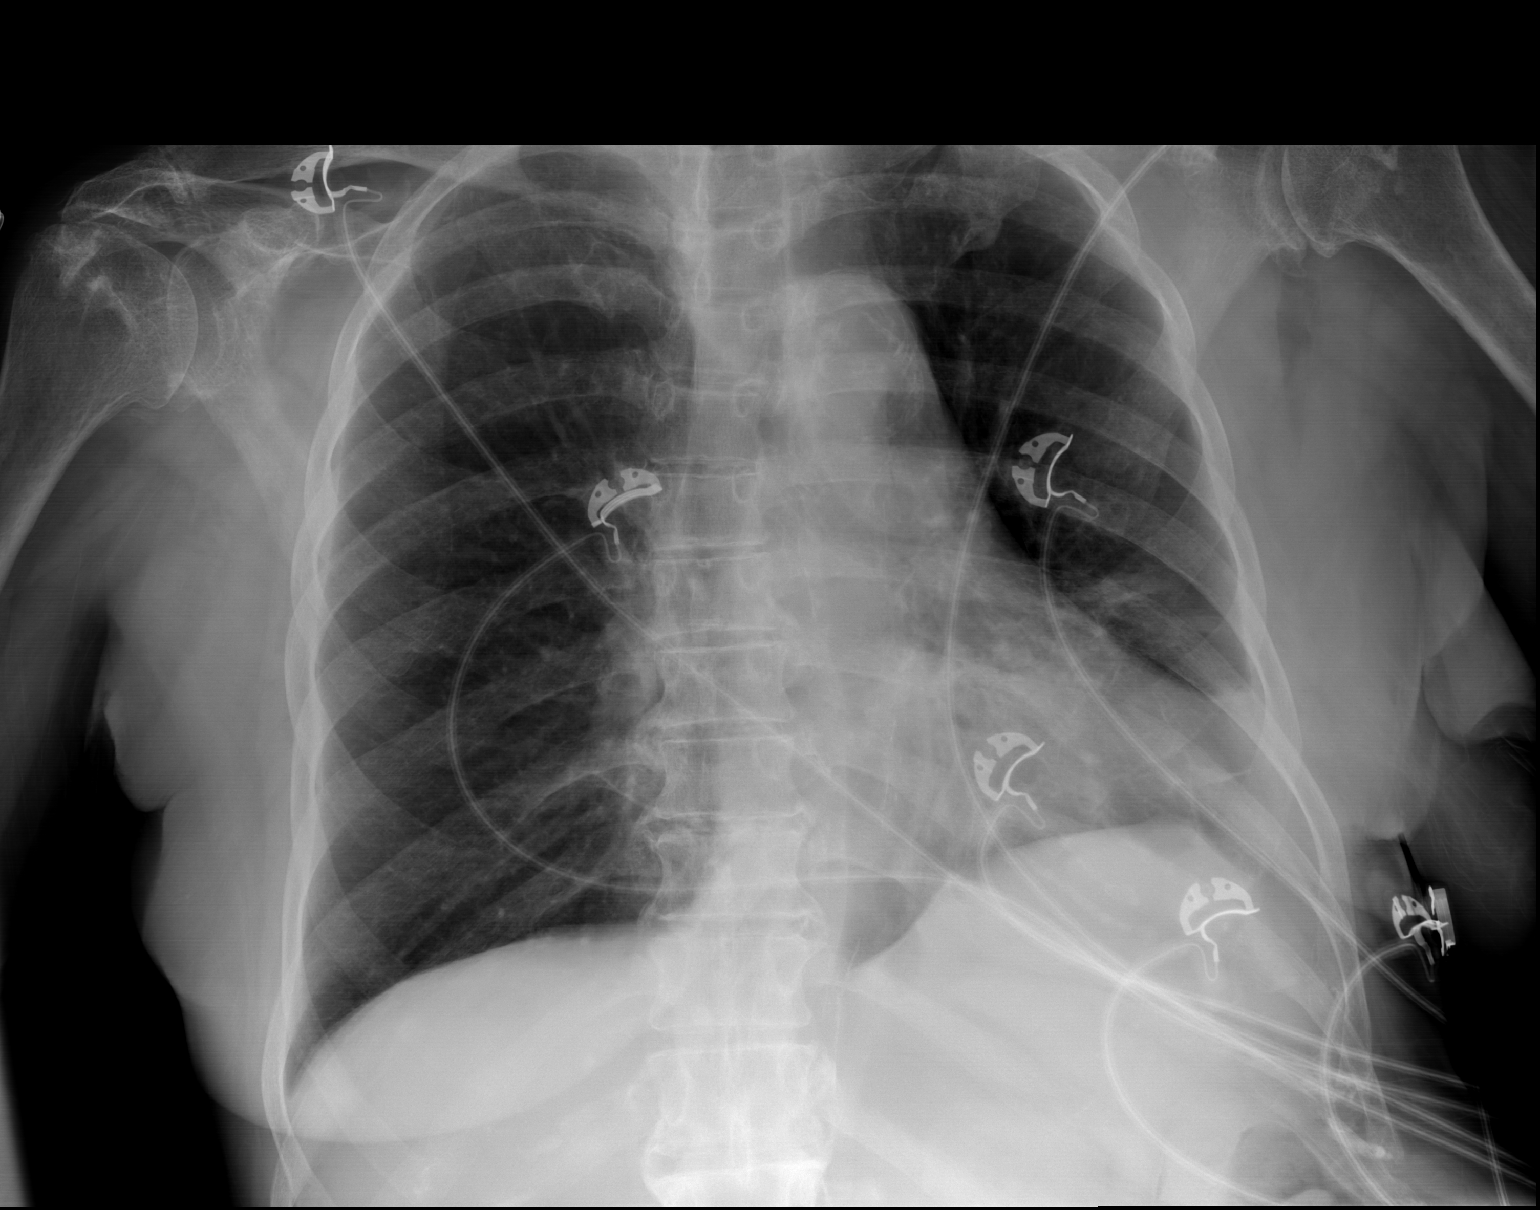

[1 of 1 positions shown; findings below may reference images not displayed]

FINDINGS: Borderline enlarged cardiac silhouette. Aortic calcification. Stable
linear densities at the left lung base and small amount of left
pleural thickening or fluid. Lower thoracic spine and bilateral
shoulder degenerative changes.
IMPRESSION: 1. Stable left basilar linear atelectasis or scarring and small
amount of left pleural thickening or fluid.
2. Aortic atherosclerosis.

## 2018-08-13 IMAGING — CT CT HEAD W/O CM
3 of 8 series · 12 of 47 positions shown, 14 images · non-contrast
Comparison: 07/14/2016

CLINICAL DATA: Per EMS, pt is coming from home after experiencing a
fall at home last night. Pt lives with her daughter and daughter
stated pt did not hit her head when fall occurred. Daughter reports
that pt had a fever and a productive cough last night and had
increased weakness after the fall occurred. Pt usually ambulates
independently but now requires assistance to get up out of the bed.
Pt AO x4.

EXAM:
CT HEAD WITHOUT CONTRAST
CT CERVICAL SPINE WITHOUT CONTRAST
TECHNIQUE: Multidetector CT imaging of the head and cervical spine was
performed following the standard protocol without intravenous
contrast. Multiplanar CT image reconstructions of the cervical spine
were also generated.

[Series 6: coronal · coronal · 0.28mm/px · 3 of 66 slices shown]
[im 56/66  brain]
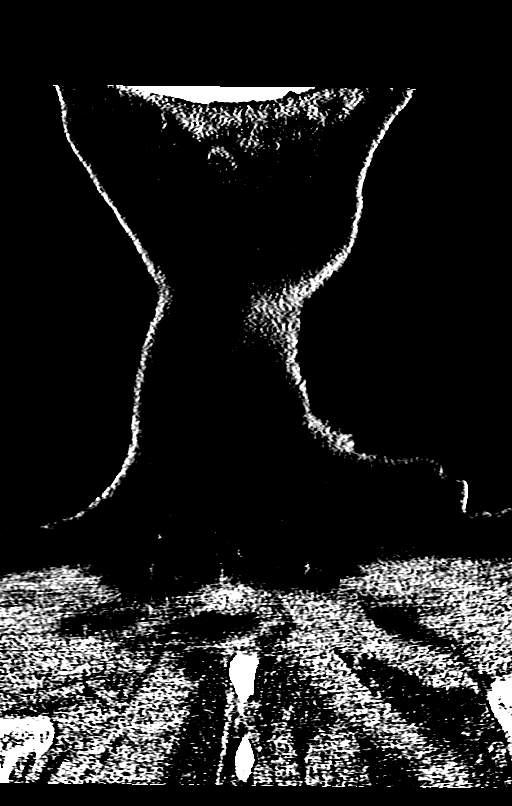
[im 58/66  brain]
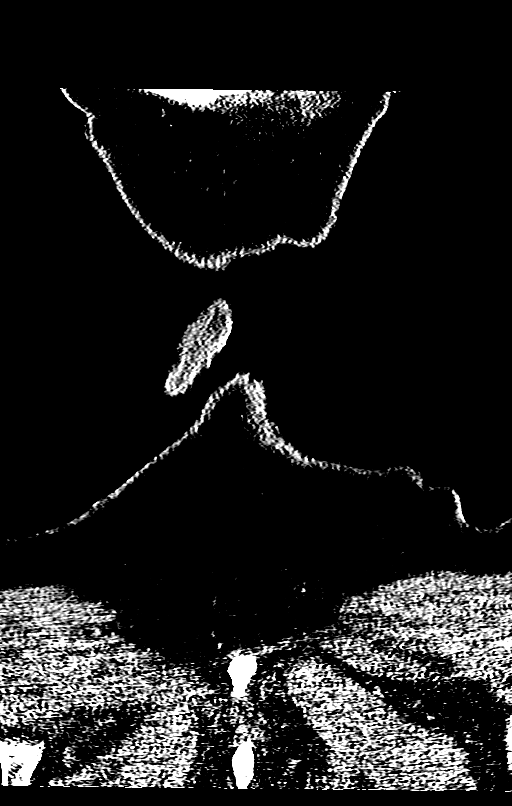
[im 61/66  brain]
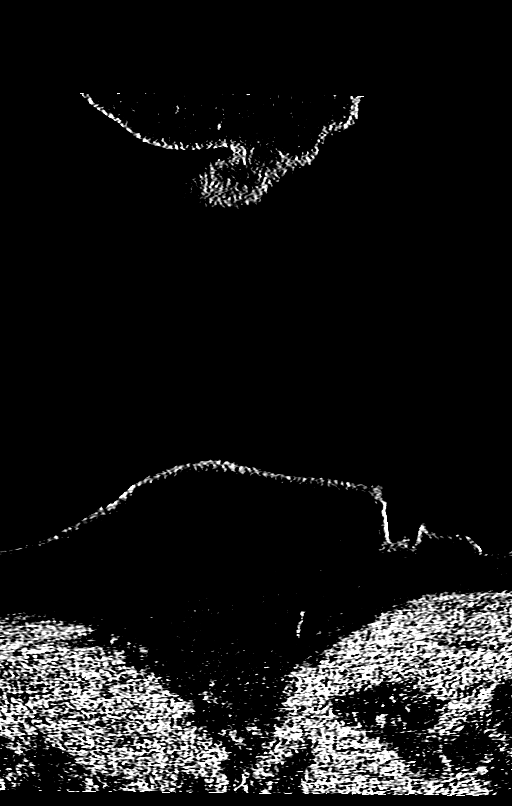

[Series 7: sagittal · sagittal · 0.27mm/px · 2 of 68 slices shown]
[im 23/68  brain]
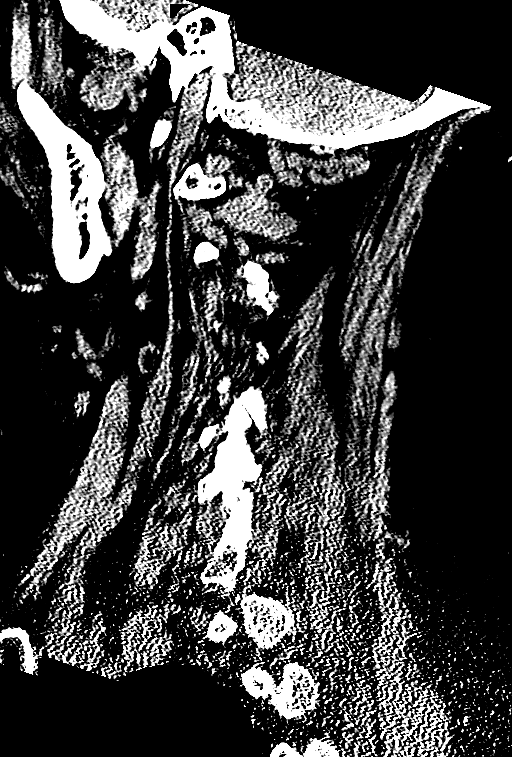
[im 45/68  brain]
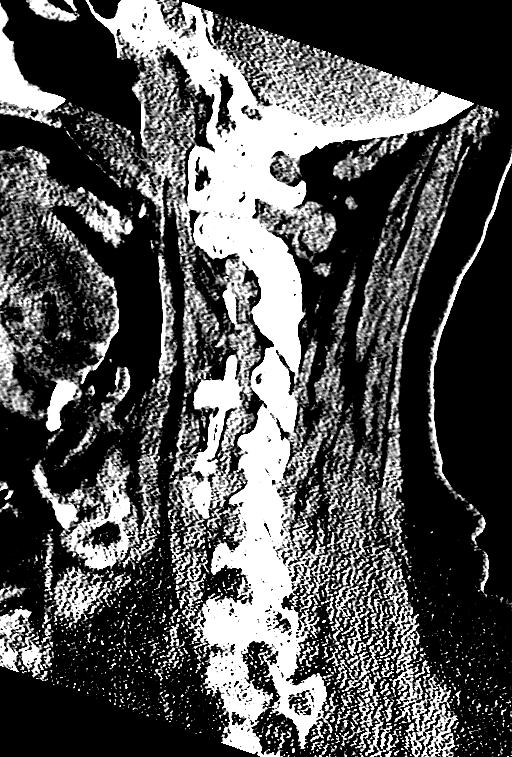

[Series 8: axial · axial · 0.23mm/px · z∈[-345,-178]mm · 7 of 109 slices shown, 9 images]
[im 10/109  brain]
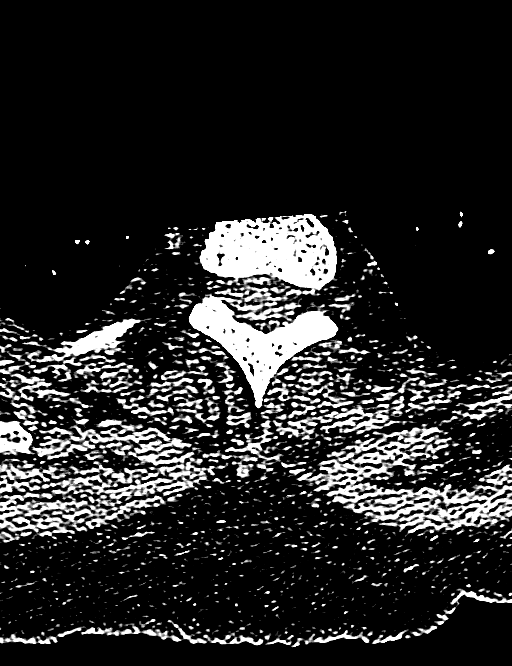
[im 10/109  bone]
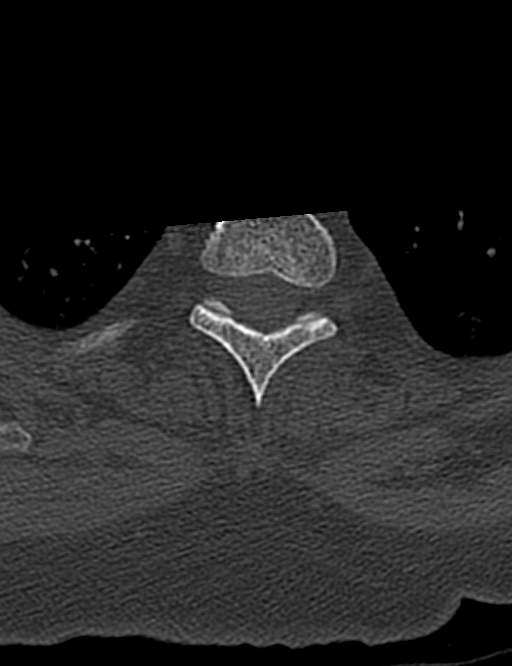
[im 30/109  brain]
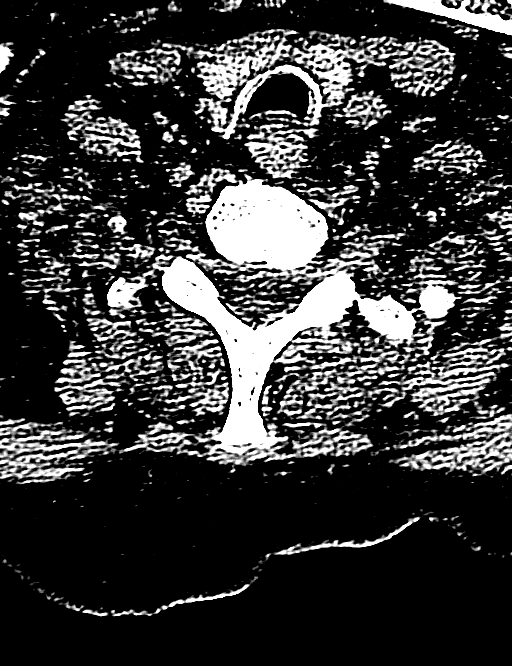
[im 40/109  brain]
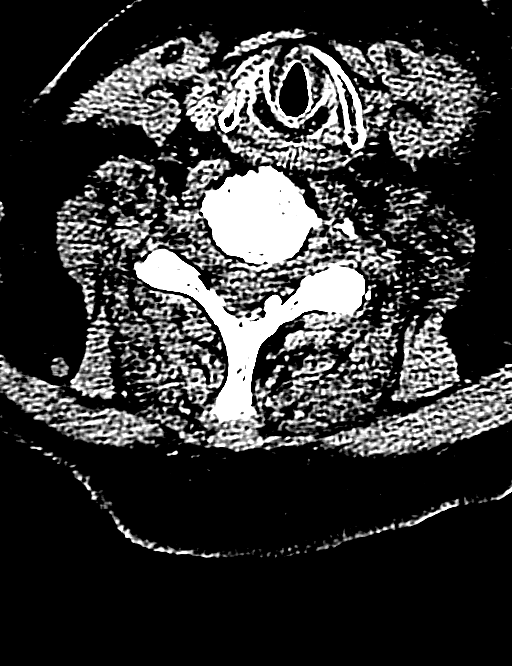
[im 59/109  brain]
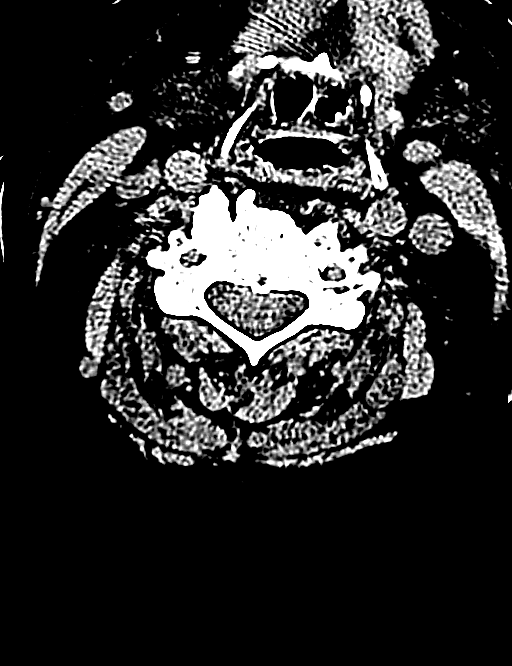
[im 69/109  brain]
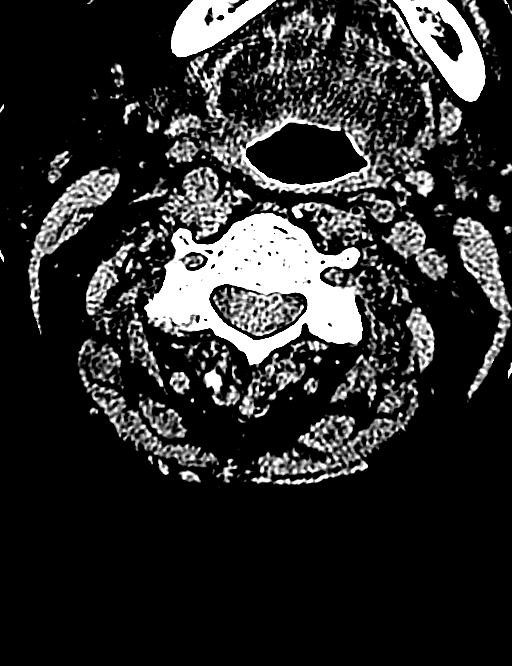
[im 69/109  bone]
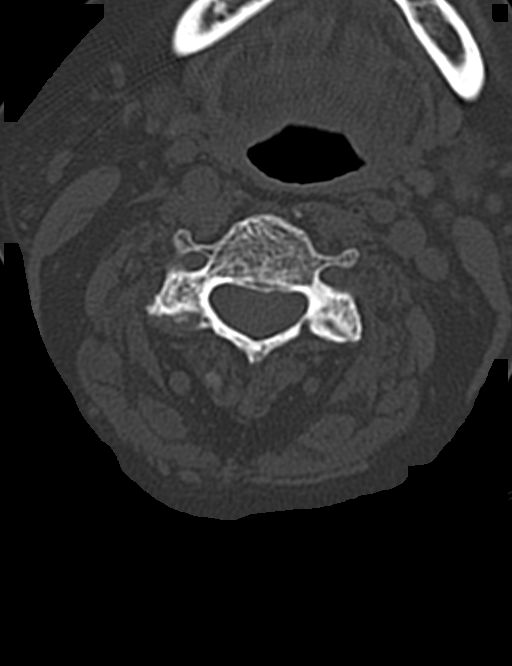
[im 79/109  brain]
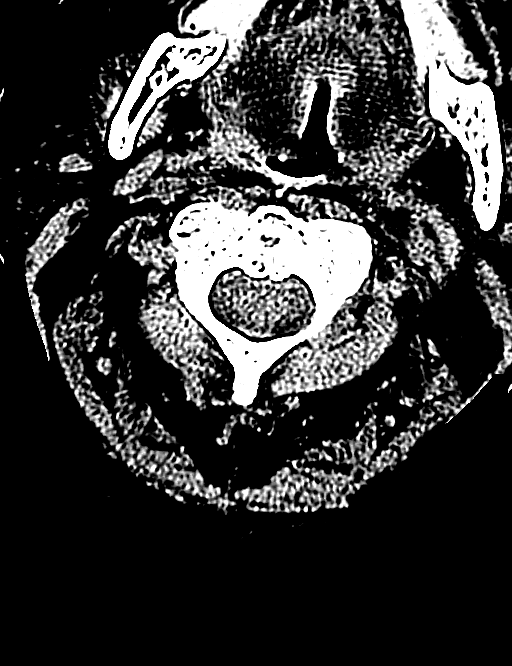
[im 99/109  brain]
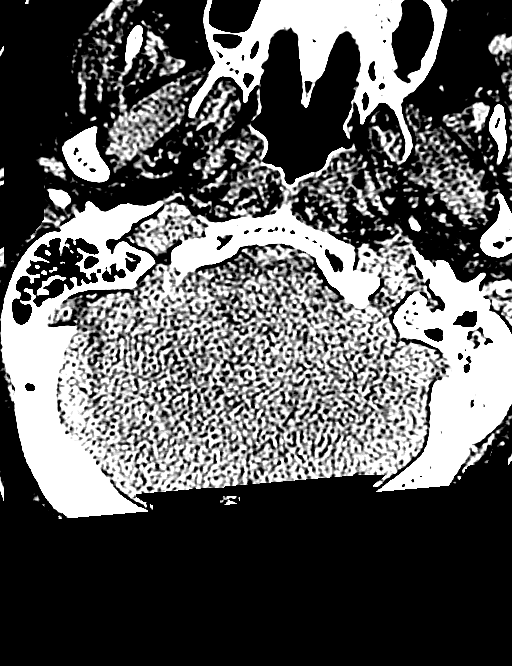

[12 of 47 positions shown; findings below may reference images not displayed]

FINDINGS: CT HEAD FINDINGS

Brain: No evidence of acute infarction, hemorrhage, hydrocephalus,
extra-axial collection or mass lesion/mass effect.

Age related volume loss. Mild chronic microvascular ischemic change.
Small old right superior cerebellar infarct. Stable appearance from
the prior exam.

Vascular: No hyperdense vessel or unexpected calcification.

Skull: Normal. Negative for fracture or focal lesion.

Sinuses/Orbits: Visualized globes and orbits are unremarkable.
Visualized sinuses and mastoid air cells are clear.

Other: None.

CT CERVICAL SPINE FINDINGS

Alignment: Mild reversal of the normal cervical lordosis, apex at
C4. No spondylolisthesis.

Skull base and vertebrae: No acute fracture. No primary bone lesion
or focal pathologic process.

Soft tissues and spinal canal: No prevertebral fluid or swelling. No
visible canal hematoma.

Disc levels: Mild loss of disc height from C3-C4 through C5-C6.
Endplate spurring noted from C4 through C7. Facet degenerative
change noted most prominent on the right at C2-C3. No disc
herniations.

Upper chest: No masses.  No acute findings.

Other: None.
IMPRESSION: HEAD CT:  No acute intracranial abnormalities.  No skull fracture.

CERVICAL CT:  No fracture or acute finding.

## 2018-08-13 IMAGING — CR DG PELVIS 1-2V
1 series · 1 of 1 positions shown · non-contrast
Comparison: Pelvis CT dated 09/25/2015

CLINICAL DATA: Pelvic pain following a fall last night.

EXAM:
PELVIS - 1-2 VIEW

[x pelvis]
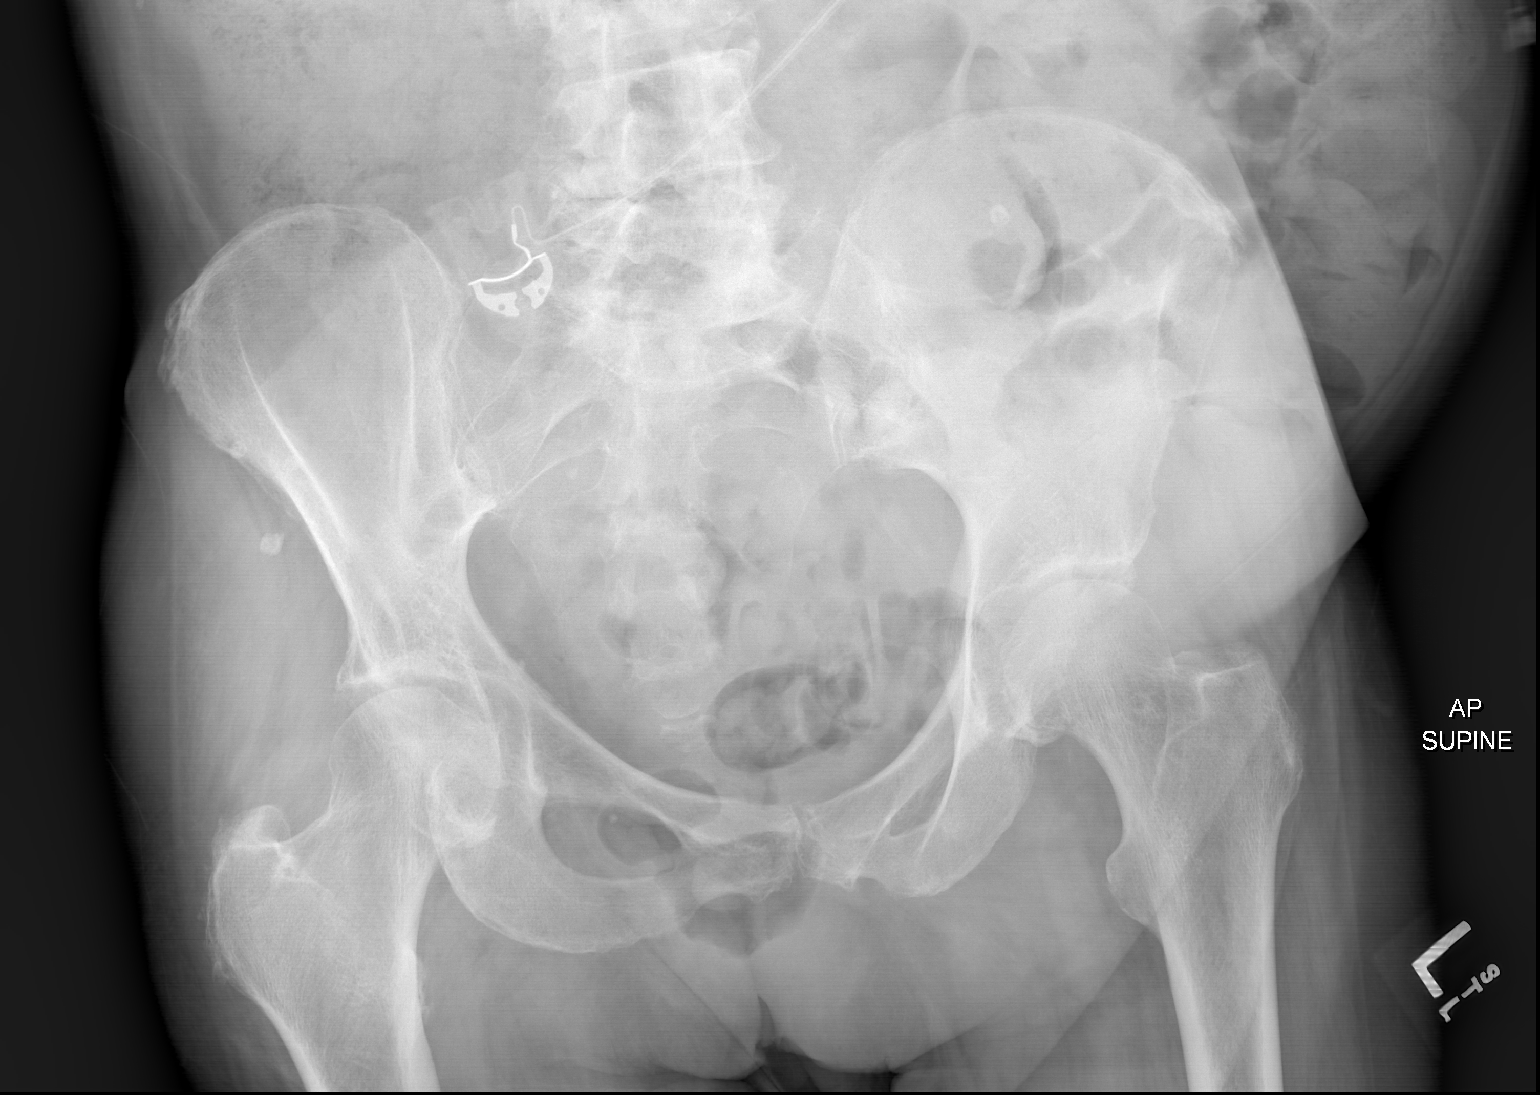

[1 of 1 positions shown; findings below may reference images not displayed]

FINDINGS: No pelvic fracture or dislocation seen. Lower lumbar spine
degenerative changes.
IMPRESSION: No fracture.

## 2018-08-16 IMAGING — DX DG CHEST 1V PORT
1 series · 1 of 1 positions shown · non-contrast
Comparison: 09/13/2016 .

CLINICAL DATA: Rhonchi.

EXAM:
PORTABLE CHEST 1 VIEW

[chest ap]
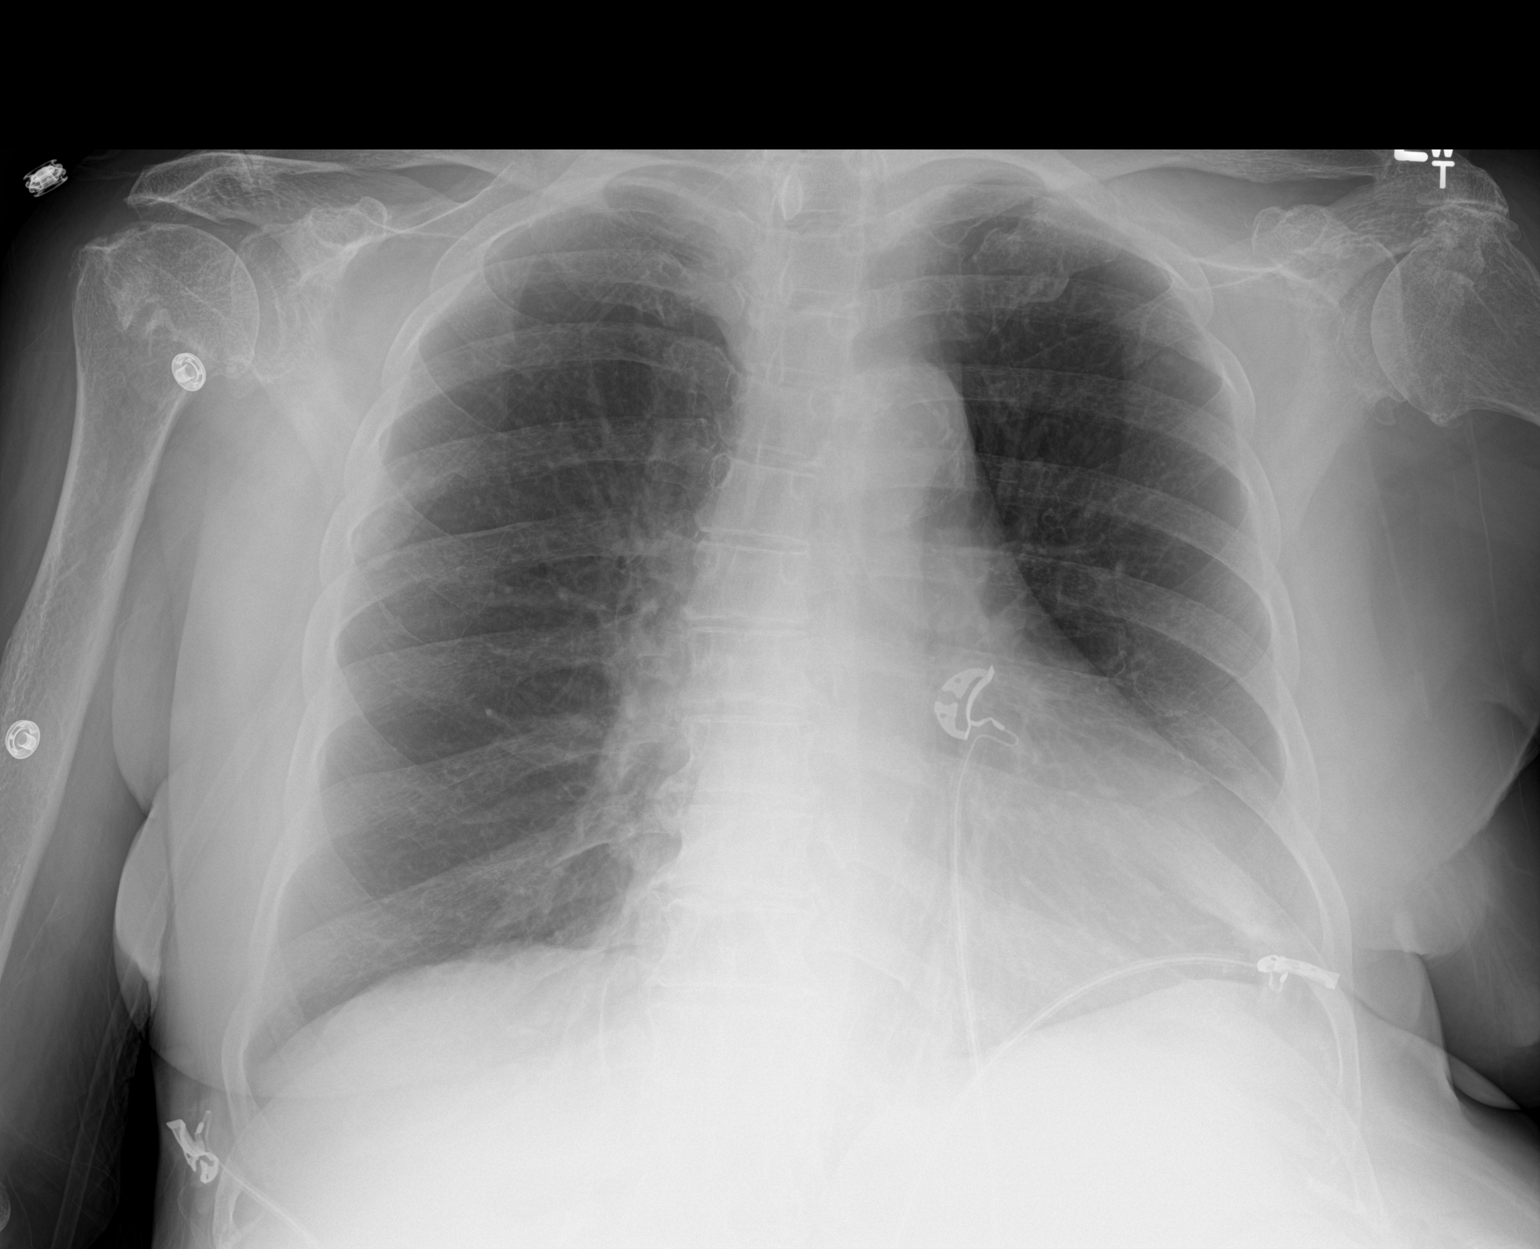

[1 of 1 positions shown; findings below may reference images not displayed]

FINDINGS: Mediastinum hilar structures normal. Cardiomegaly with normal
pulmonary vascularity. No focal infiltrate. Stable pleural
thickening on the left consistent with scarring. No pneumothorax .
IMPRESSION: 1.  Stable cardiomegaly No pulmonary venous congestion.

2. Stable left base pleuroparenchymal thickening consistent with
scarring. No focal infiltrate identified.

## 2018-09-07 IMAGING — CT CT ABD-PELV W/O CM
2 of 4 series · 16 of 46 positions shown, 18 images · non-contrast
Comparison: 09/25/2015

CLINICAL DATA: Abdominal pain and vomiting

EXAM:
CT ABDOMEN AND PELVIS WITHOUT CONTRAST
TECHNIQUE: Multidetector CT imaging of the abdomen and pelvis was performed
following the standard protocol without IV contrast.

[Series 2: abd/pel w/o · axial · non-contrast · 0.82mm/px · z∈[-490,-70]mm · 13 of 96 slices shown, 15 images]
[im 6/96  soft-tissue]
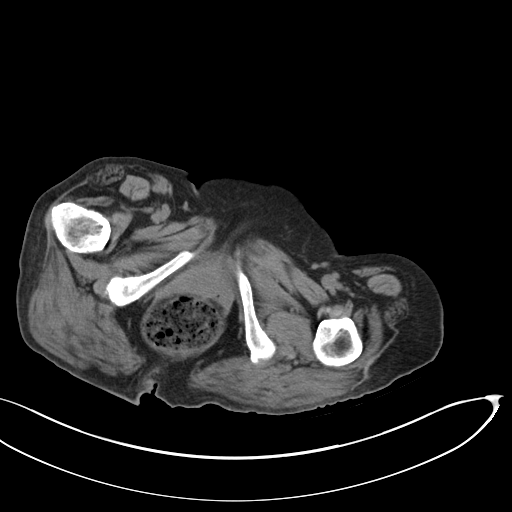
[im 6/96  bone]
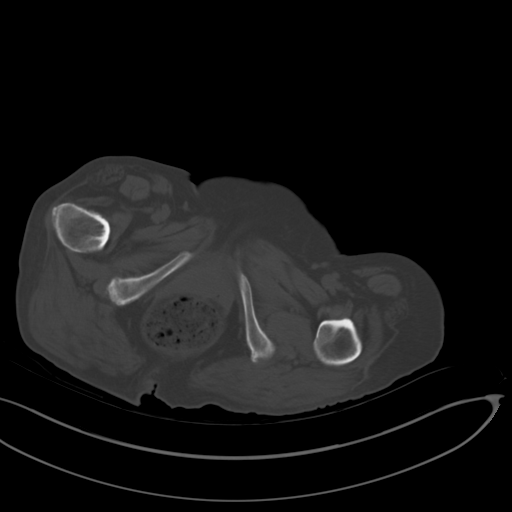
[im 11/96  soft-tissue]
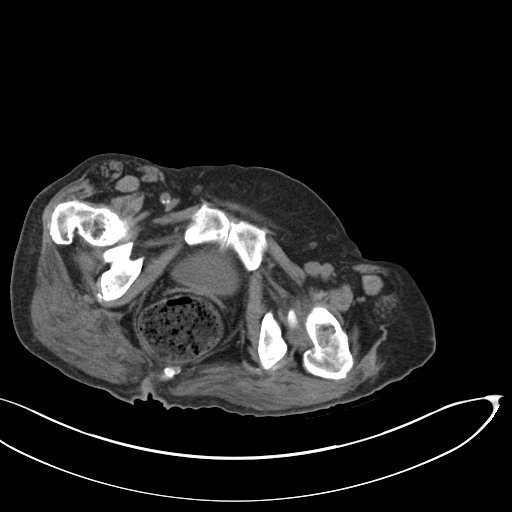
[im 22/96  soft-tissue]
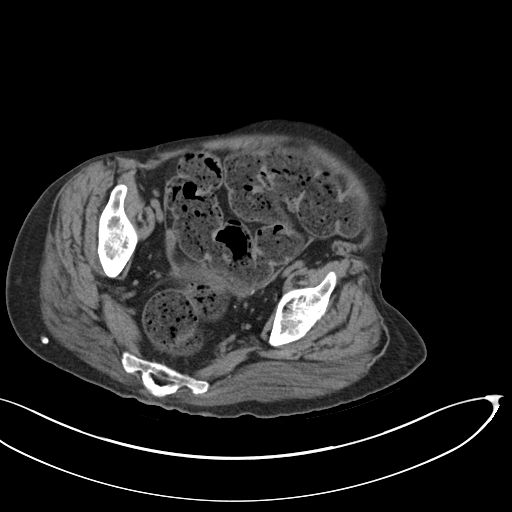
[im 27/96  soft-tissue]
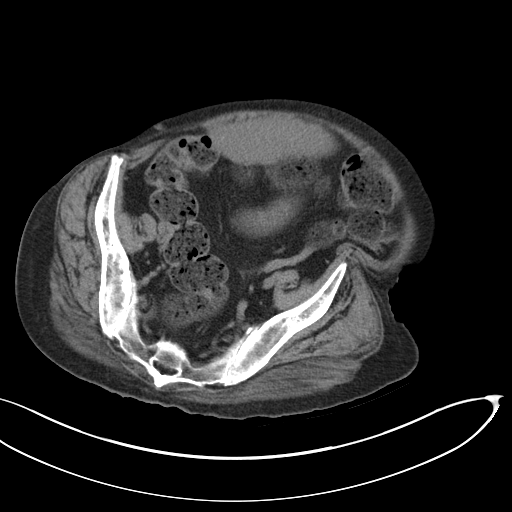
[im 32/96  soft-tissue]
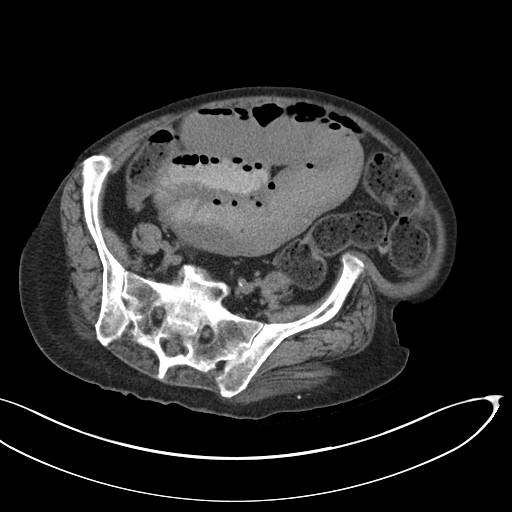
[im 43/96  soft-tissue]
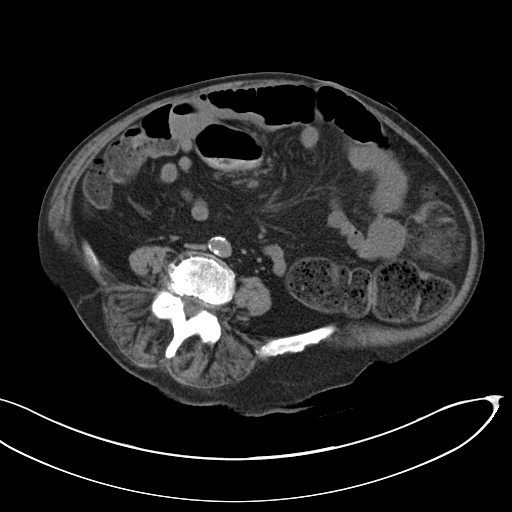
[im 48/96  soft-tissue]
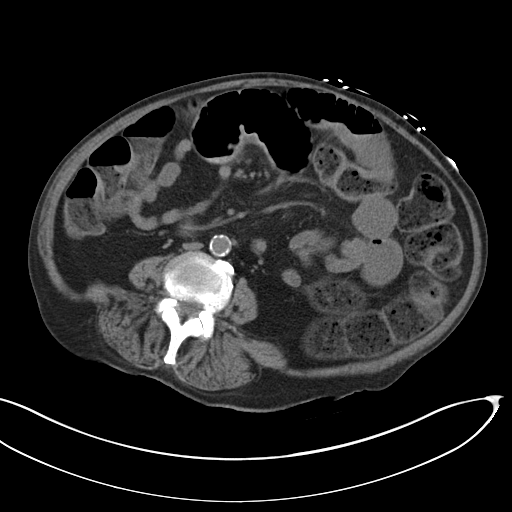
[im 53/96  soft-tissue]
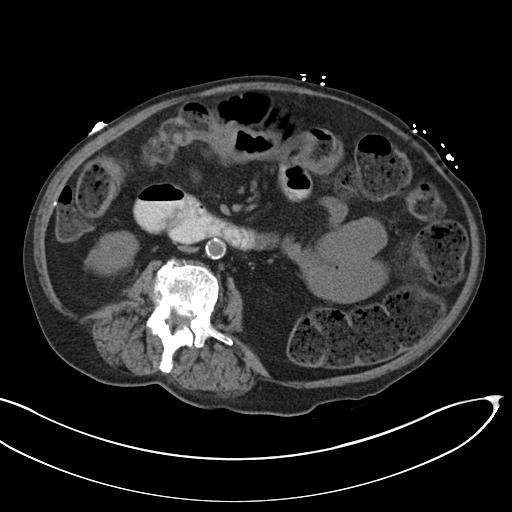
[im 64/96  soft-tissue]
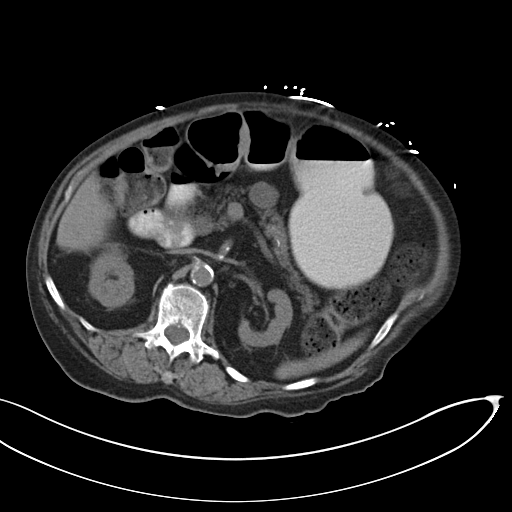
[im 64/96  bone]
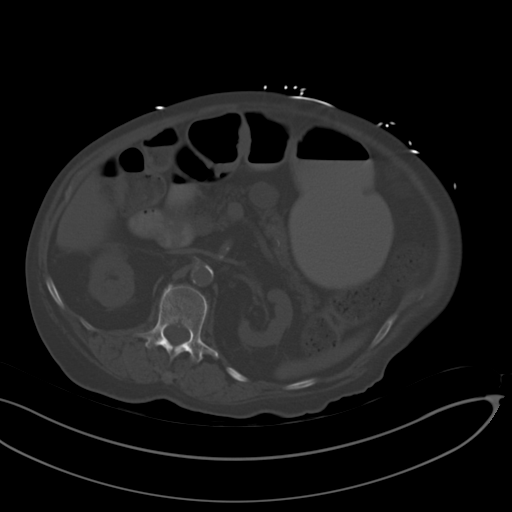
[im 69/96  soft-tissue]
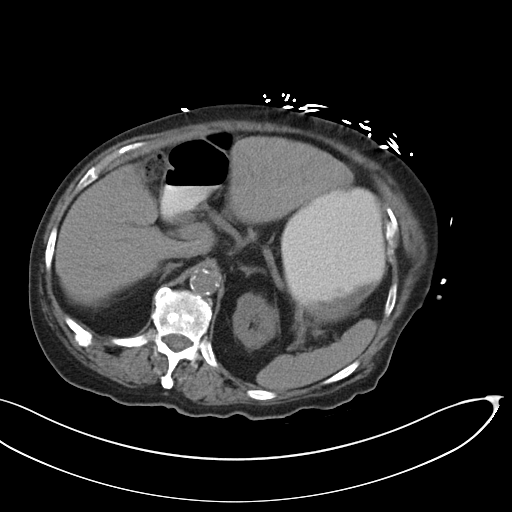
[im 74/96  soft-tissue]
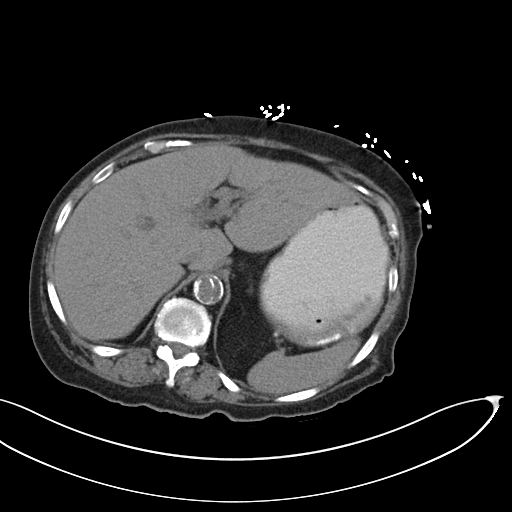
[im 85/96  soft-tissue]
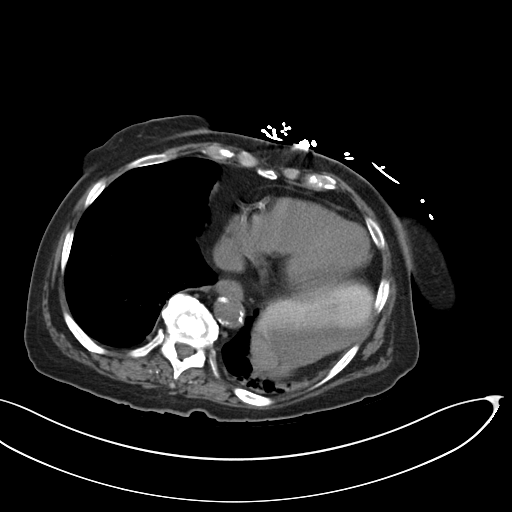
[im 90/96  soft-tissue]
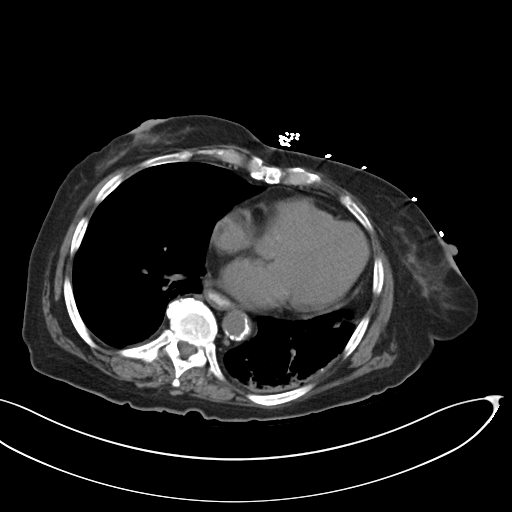

[Series 4: coronal · coronal · 0.84mm/px · 3 of 173 slices shown]
[im 58/173  soft-tissue]
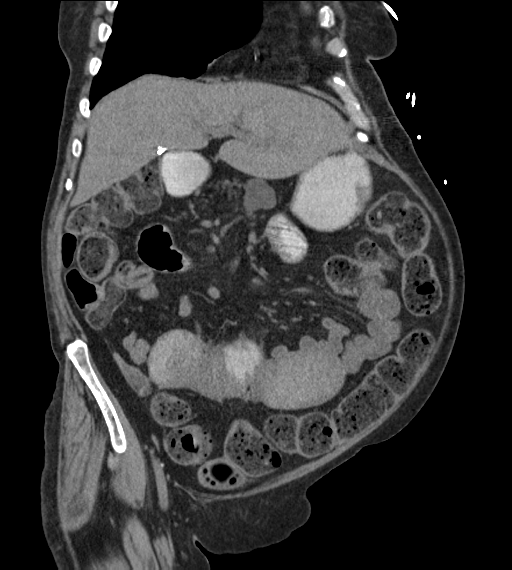
[im 77/173  soft-tissue]
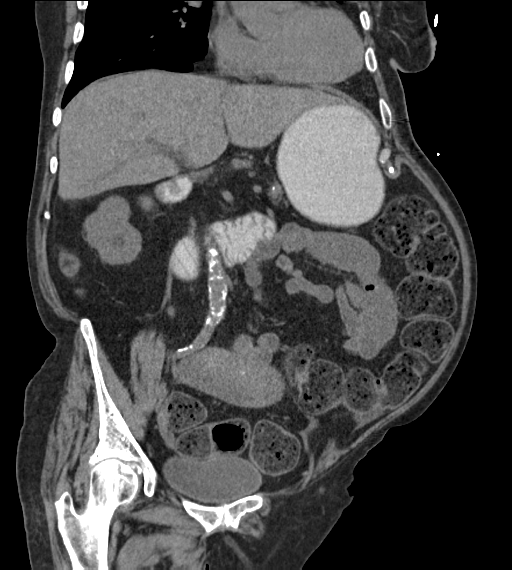
[im 96/173  soft-tissue]
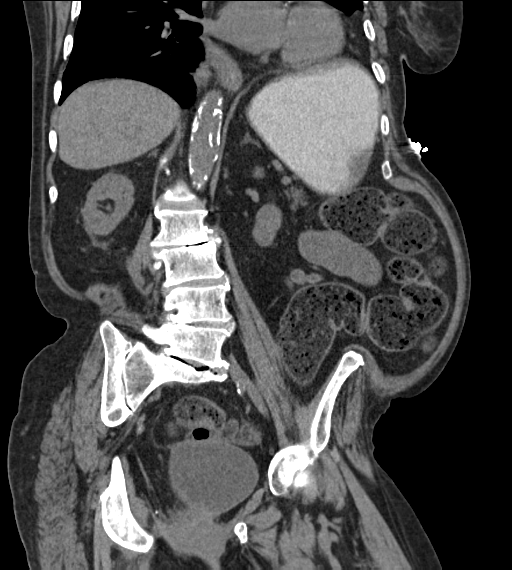

[16 of 46 positions shown; findings below may reference images not displayed]

FINDINGS: Lower chest: Bibasilar scarring is again identified. Coronary
calcifications are seen. Mild reflux is noted in the distal
esophagus.

Hepatobiliary: No focal liver abnormality is seen. Status post
cholecystectomy. No biliary dilatation.

Pancreas: Pancreas is well visualized. A fluid attenuation area is
noted adjacent to the body of the pancreas which measures
approximately 2.7 cm. In retrospect this is stable from the prior
exam and likely represents a small pseudocyst. The pancreas is
otherwise within normal limits.

Spleen: Normal in size without focal abnormality.

Adrenals/Urinary Tract: The adrenal glands are within normal limits.
The kidneys are well visualized without renal calculi or obstructive
changes. A exophytic cyst is noted arising from the posterior aspect
of the mid to lower pole stable from the prior exam. Smaller left
renal cyst is also noted stable from the previous study. The bladder
is partially distended. Some air is noted within the bladder which
may be related to recent instrumentation.

Stomach/Bowel: Considerable fecal material is noted throughout the
colon. No true colonic obstructive changes are seen. In the mid
jejunum however there are multiple dilated loops of small bowel
identified. The ileum appears within normal limits. No true
transition zone is identified. This may represent a small-bowel
ileus although the possibility of adhesions would deserve
consideration.

Vascular/Lymphatic: Aortic atherosclerosis. No enlarged abdominal or
pelvic lymph nodes.

Reproductive: Status post hysterectomy. No adnexal masses.

Other: No abdominal wall hernia or abnormality. No abdominopelvic
ascites.

Musculoskeletal: Degenerative changes of the lumbar spine are seen.
No acute bony abnormality is noted.
IMPRESSION: Dilated loops of jejunum without definitive transition zone. This
may represent a small-bowel ileus. Clinical correlation is
recommended.

Fluid attenuation lesion within the midportion of the pancreas
relatively stable from the previous exam consistent with a small
pseudocyst.

Fecal material throughout the colon consistent with a degree of
constipation.

## 2018-09-07 IMAGING — CT CT HEAD W/O CM
4 of 8 series · 15 of 47 positions shown, 17 images · non-contrast
Comparison: 09/12/2016 and prior CTs

CLINICAL DATA: 75-year-old female with acute encephalopathy and
altered mental status.

EXAM:
CT HEAD WITHOUT CONTRAST
TECHNIQUE: Contiguous axial images were obtained from the base of the skull
through the vertex without intravenous contrast.

[Series 2: head w/o · axial · non-contrast · 0.45mm/px · z∈[-103,+12]mm · 7 of 31 slices shown, 9 images (1 of 2)]
[im 4/31  brain]
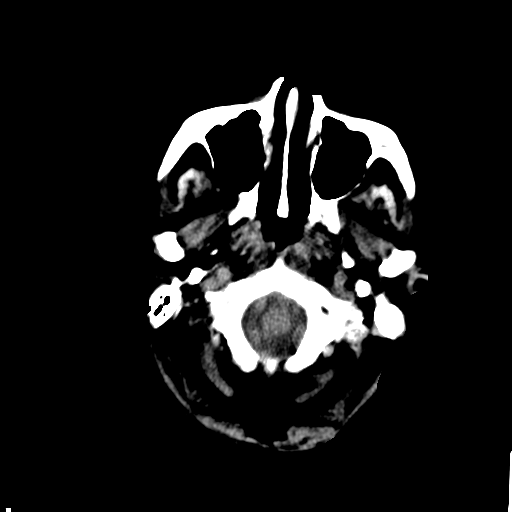
[im 4/31  bone]
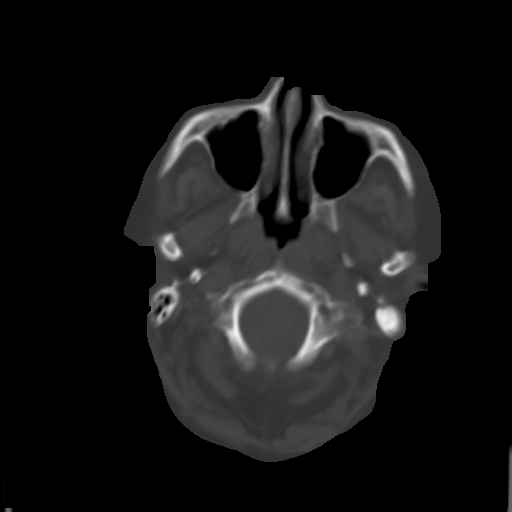
[im 8/31  brain]
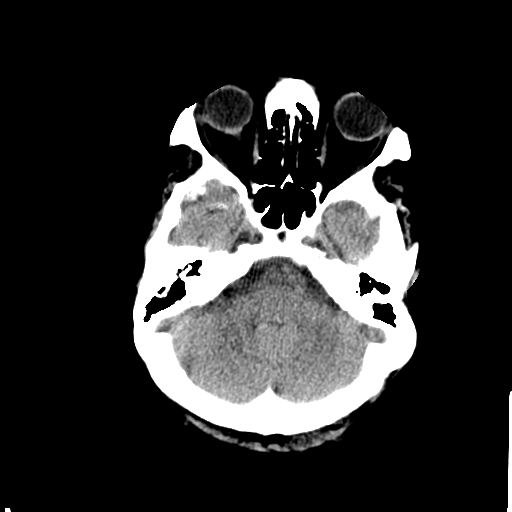
[im 12/31  brain]
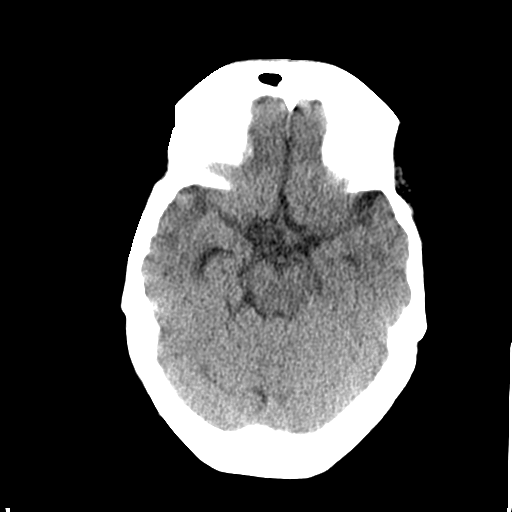
[im 16/31  brain]
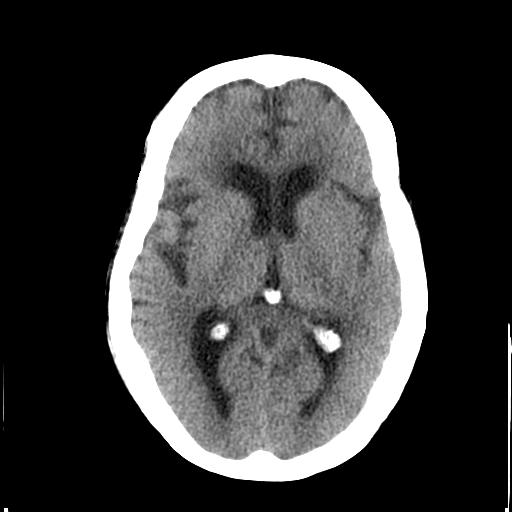
[im 19/31  brain]
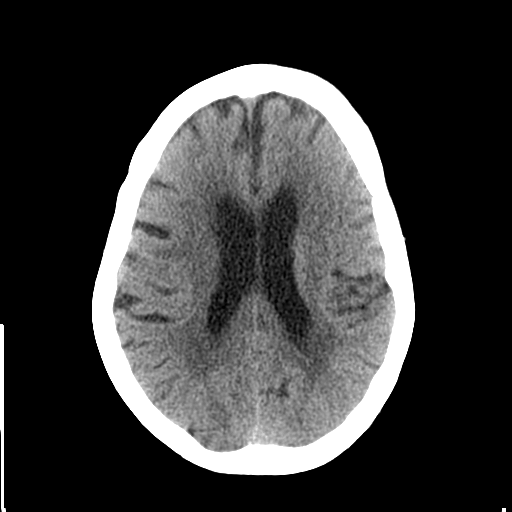
[im 19/31  bone]
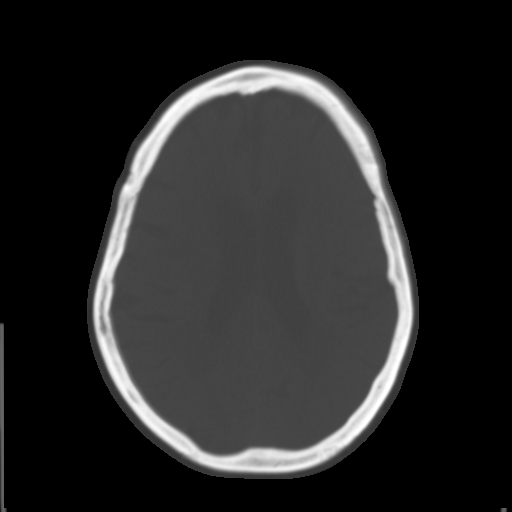
[im 23/31  brain]
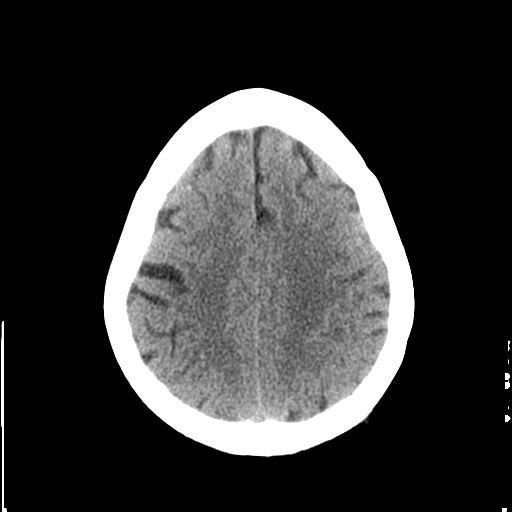
[im 27/31  brain]
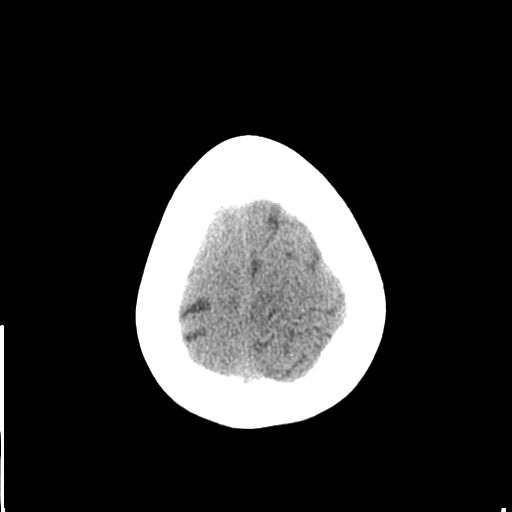

[Series 4: head w/o · axial · non-contrast · 0.45mm/px · z∈[-56,-11]mm · 3 of 23 slices shown (2 of 2)]
[im 5/23  brain]
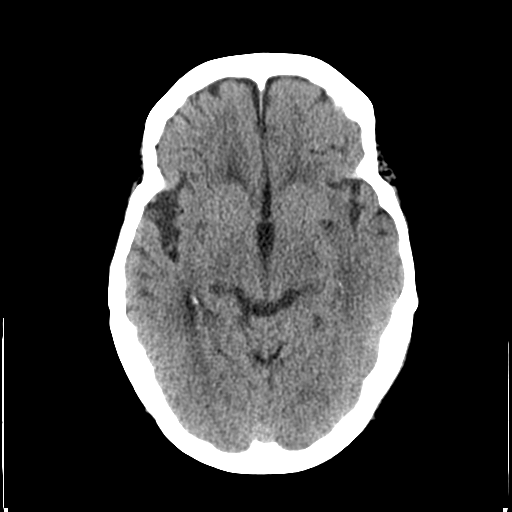
[im 9/23  brain]
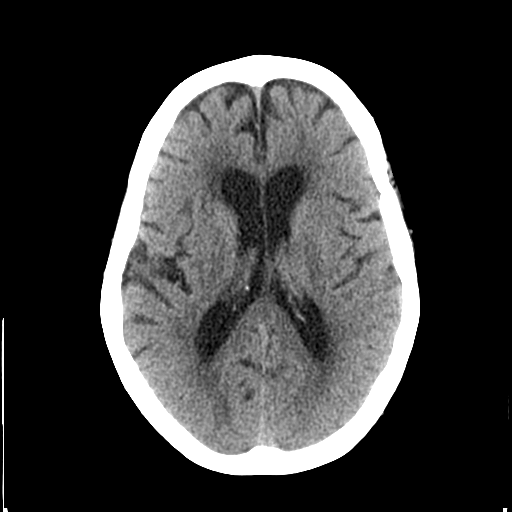
[im 14/23  brain]
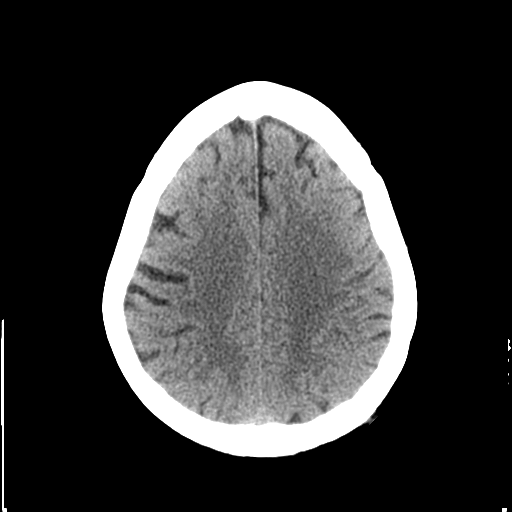

[Series 8: coronal · coronal · 0.31mm/px · 3 of 77 slices shown]
[im 20/77  brain]
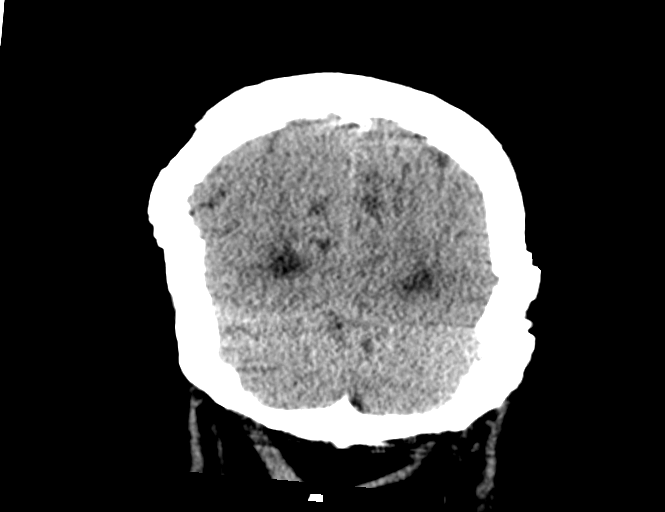
[im 39/77  brain]
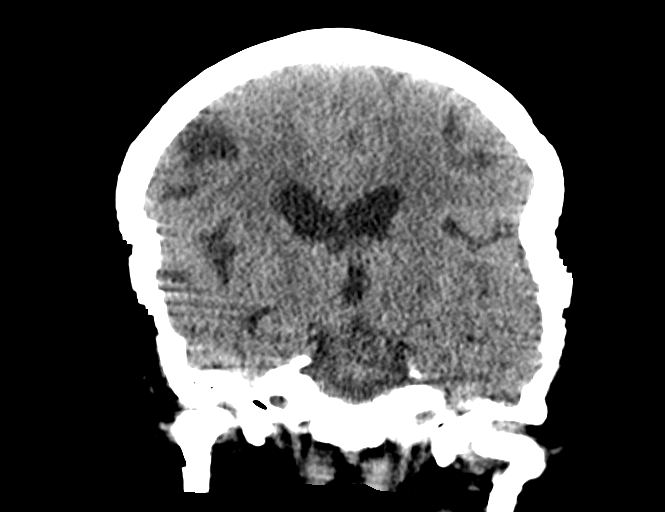
[im 58/77  brain]
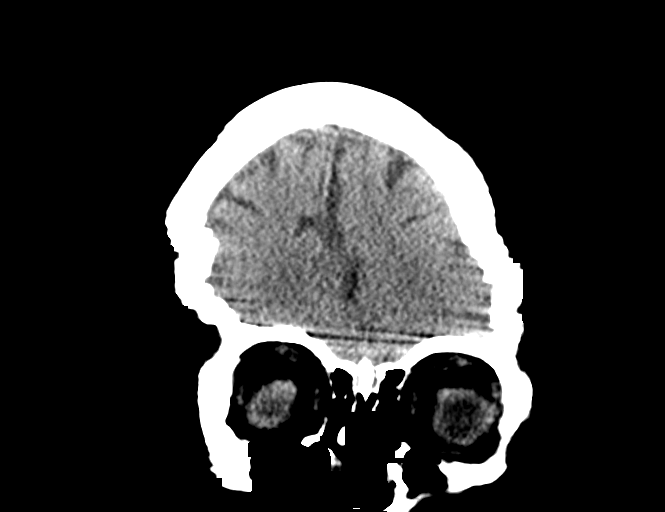

[Series 11: sagittal · sagittal · 0.26mm/px · 2 of 77 slices shown]
[im 26/77  brain]
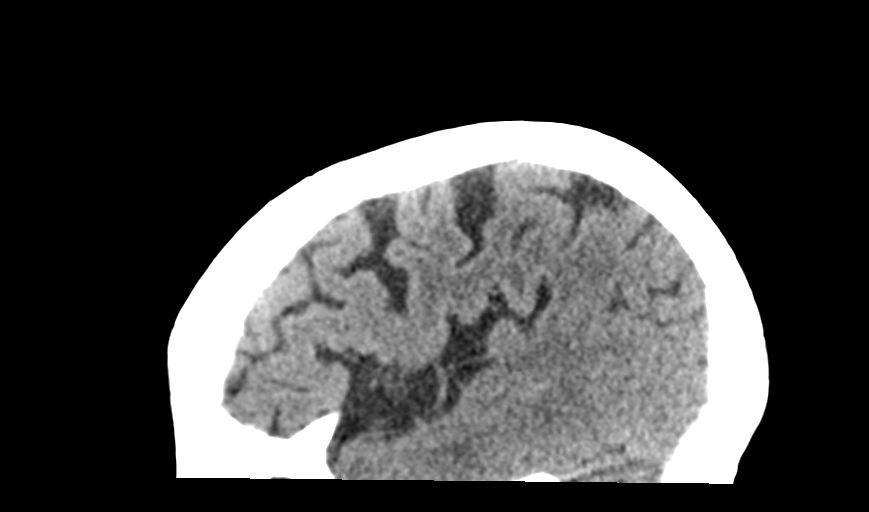
[im 51/77  brain]
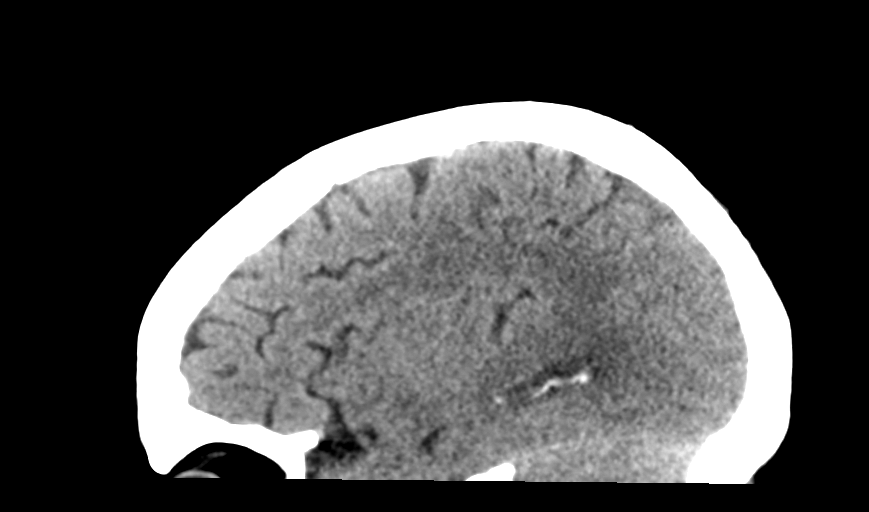

[15 of 47 positions shown; findings below may reference images not displayed]

FINDINGS: Brain: No evidence of acute infarction, hemorrhage, hydrocephalus,
extra-axial collection or mass lesion/mass effect.

Chronic small-vessel white matter ischemic changes and a remote
right cerebellar infarct are again noted.

Vascular: Mild intracranial atherosclerotic calcifications noted.

Skull: Normal. Negative for fracture or focal lesion.

Sinuses/Orbits: No acute finding.

Other: None.
IMPRESSION: No evidence of acute intracranial abnormality.

Chronic small-vessel white matter ischemic changes and remote right
cerebellar infarct.

## 2018-09-07 IMAGING — CR DG CHEST 2V
2 series · 2 of 2 positions shown · non-contrast
Comparison: 09/15/2016

CLINICAL DATA: Cough and congestion

EXAM:
CHEST  2 VIEW

[x chest ap]
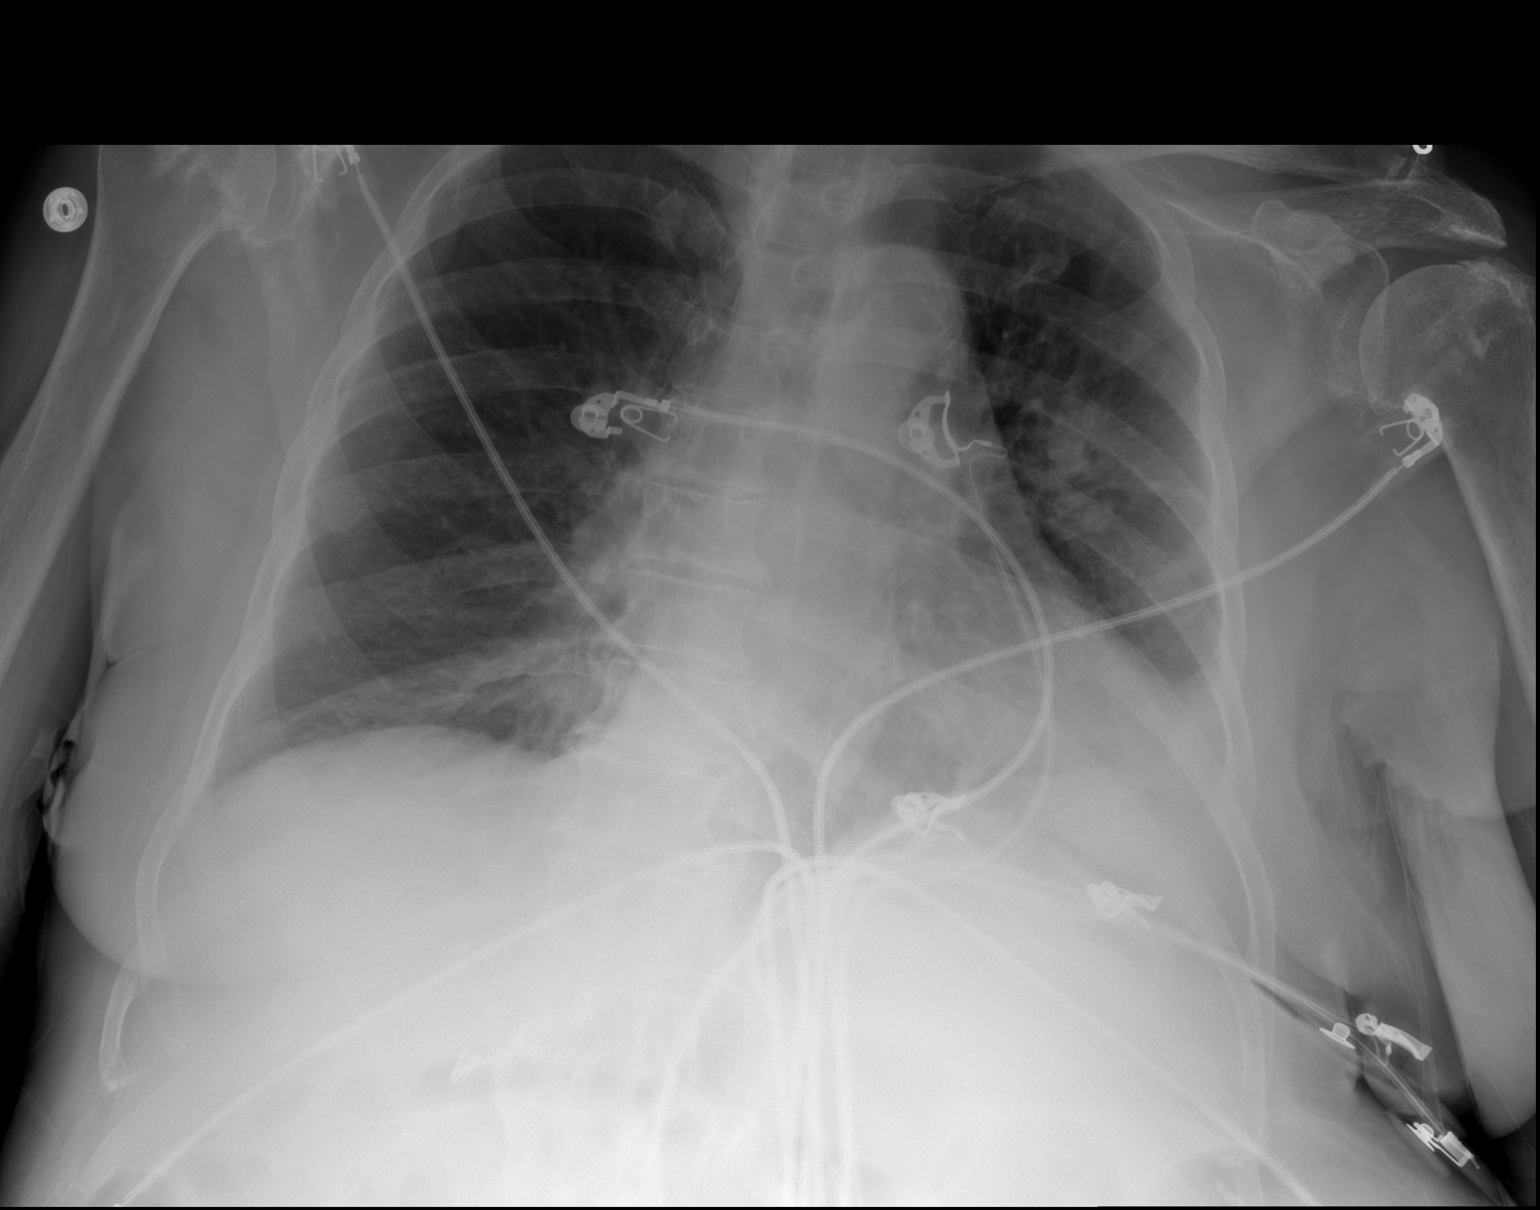

[w chest lat]
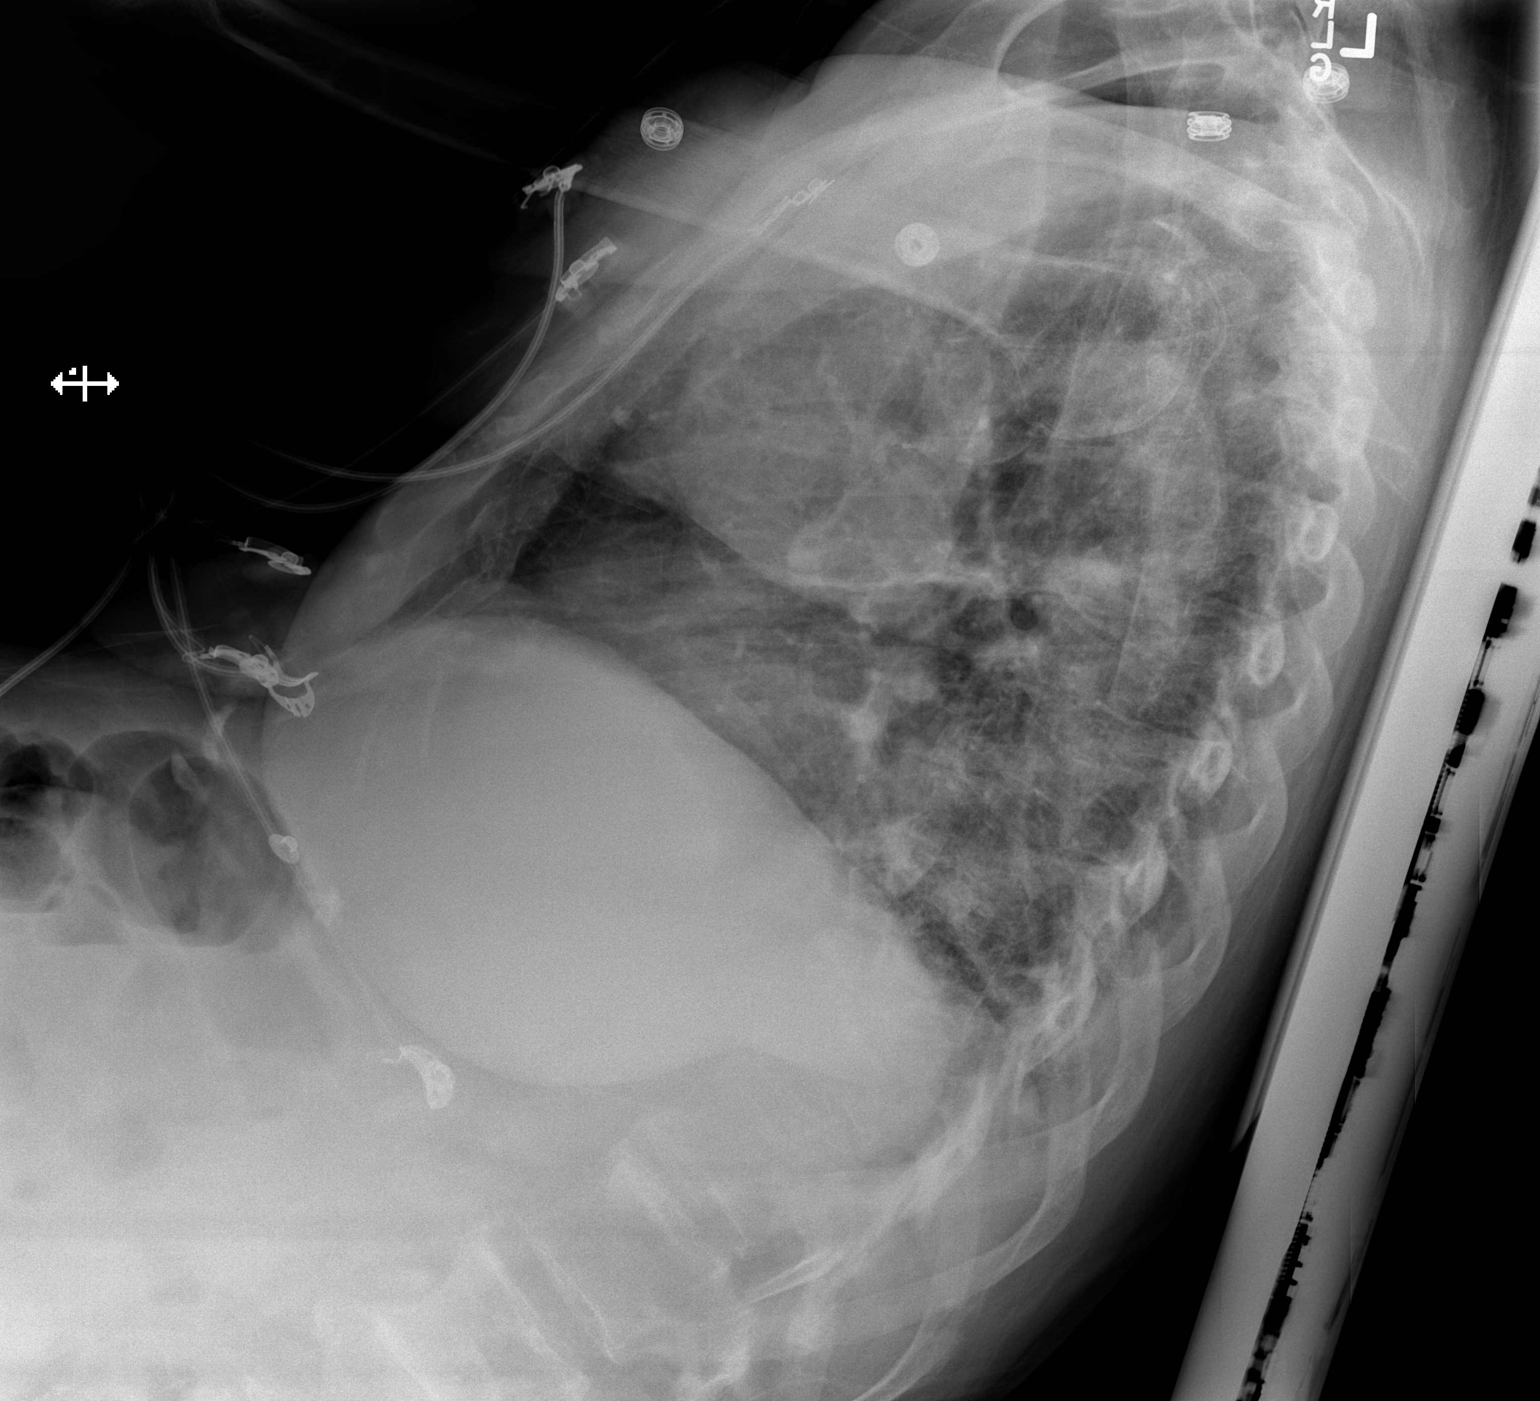

[2 of 2 positions shown; findings below may reference images not displayed]

FINDINGS: Cardiac shadow is stable. The right lung is well aerated without
focal infiltrate. Patchy changes are noted in the left mid lung and
lung base increased from the prior exam. No bony abnormality is
noted.
IMPRESSION: Patchy infiltrates in the left lung as described.

## 2018-09-07 IMAGING — DX DG CHEST 1V PORT
1 series · 1 of 1 positions shown · non-contrast
Comparison: 10/07/2016 and prior radiographs

CLINICAL DATA: Altered mental status.

EXAM:
PORTABLE CHEST 1 VIEW

[chest ap]
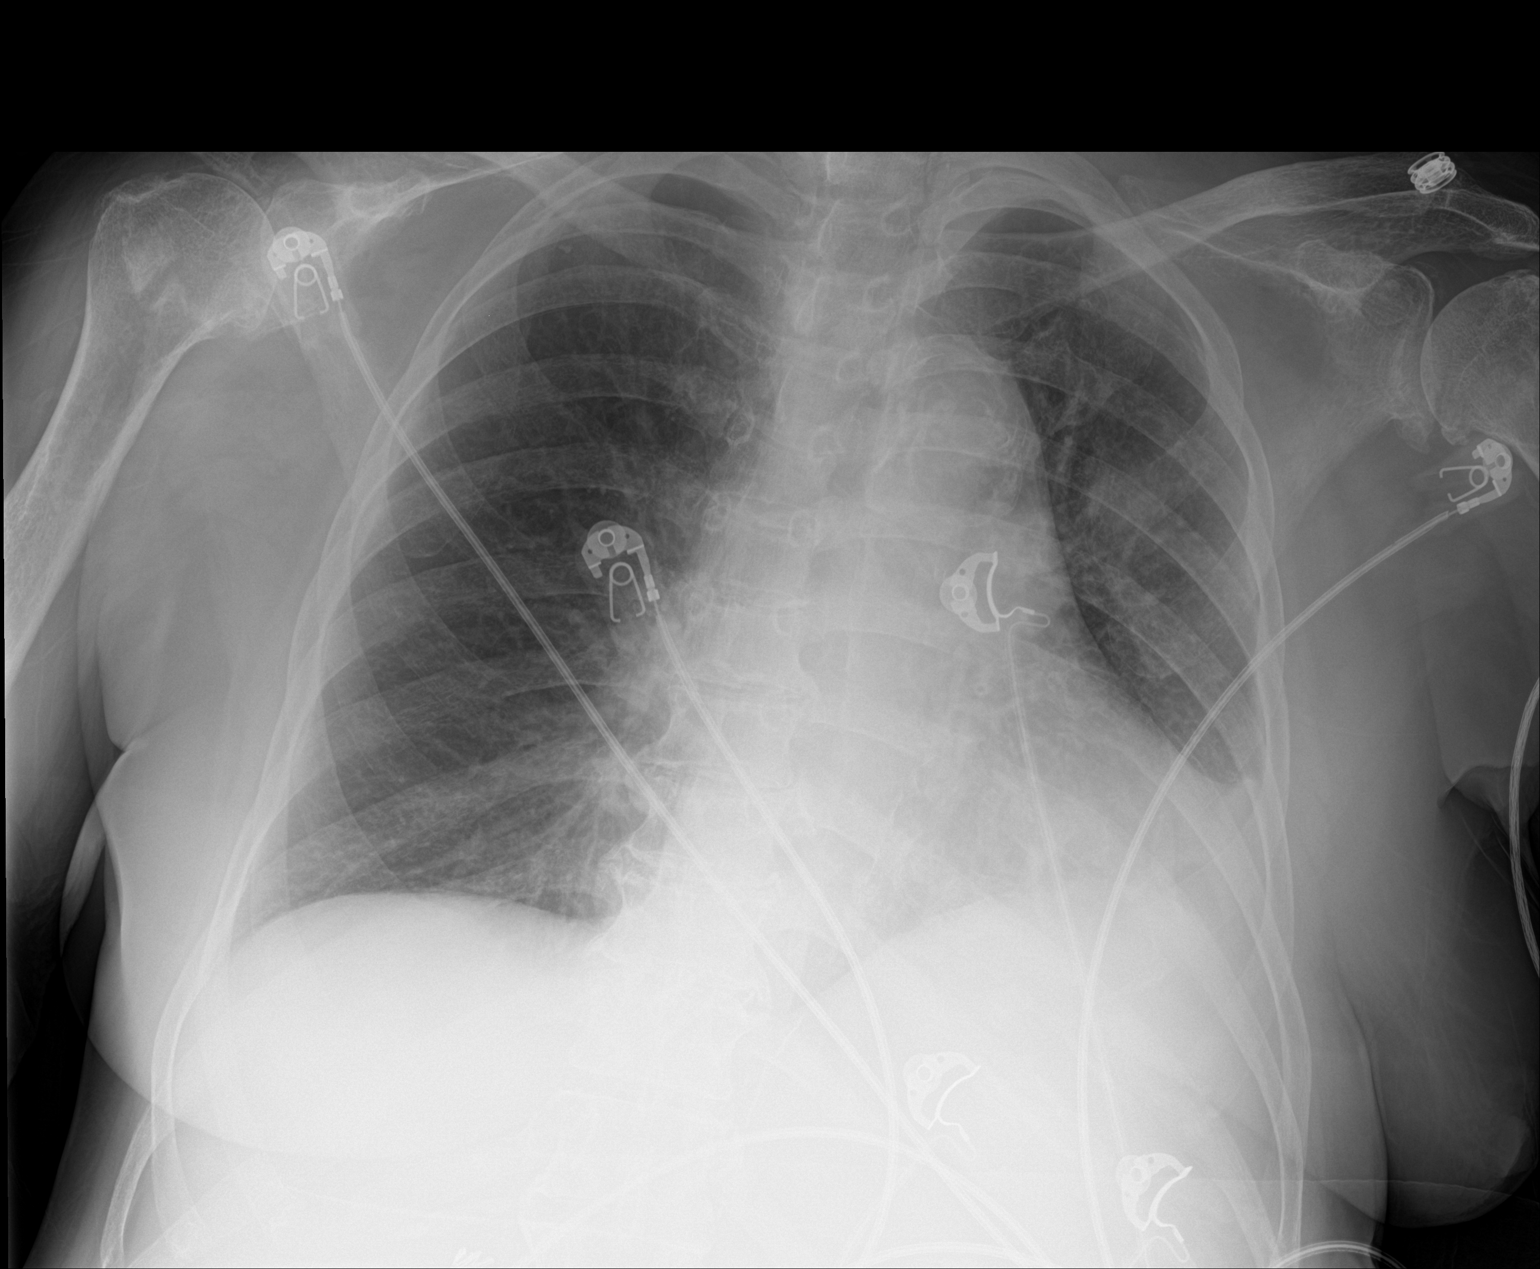

[1 of 1 positions shown; findings below may reference images not displayed]

FINDINGS: The cardiomediastinal silhouette is unremarkable.

Left basilar opacity/ atelectasis again noted.

Patchy left upper lung airspace disease is unchanged.

The right lung is clear.

There is no evidence of pneumothorax.
IMPRESSION: Little significant change with left basilar opacity/atelectasis and
patchy left upper lung airspace disease again noted.

## 2018-09-08 IMAGING — CT CT CHEST W/O CM
2 of 3 series · 15 of 36 positions shown, 18 images · non-contrast
Comparison: None.

CLINICAL DATA: Recurrent lobar pneumonia.  Evaluate for mass.

EXAM:
CT CHEST WITHOUT CONTRAST
TECHNIQUE: Multidetector CT imaging of the chest was performed following the
standard protocol without IV contrast.

[Series 2: thorax · axial · 0.73mm/px · z∈[-383,-105]mm · 12 of 165 slices shown, 15 images]
[im 13/165  mediastinal]
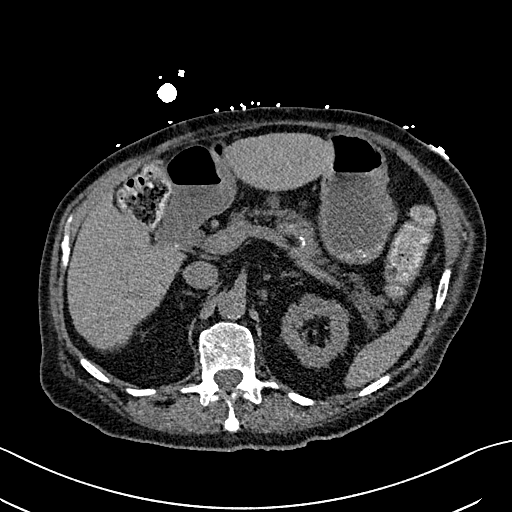
[im 13/165  lung]
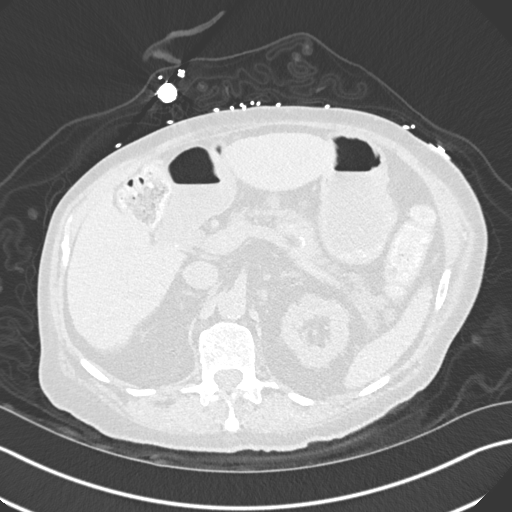
[im 25/165  lung]
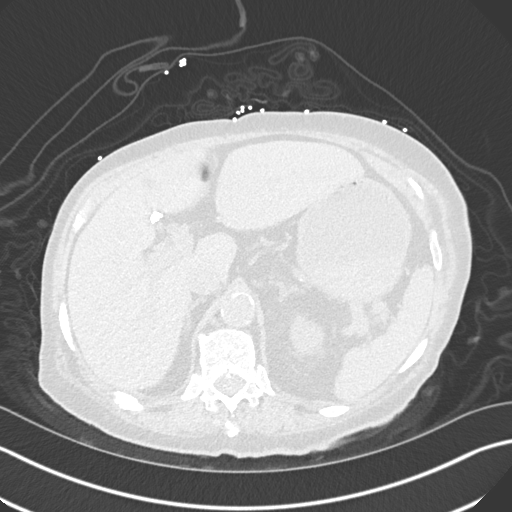
[im 37/165  lung]
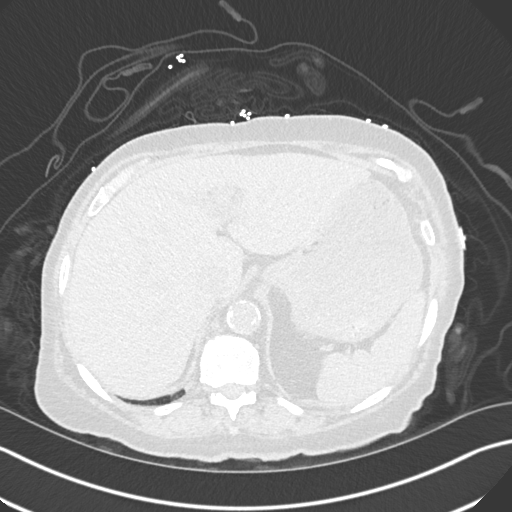
[im 49/165  lung]
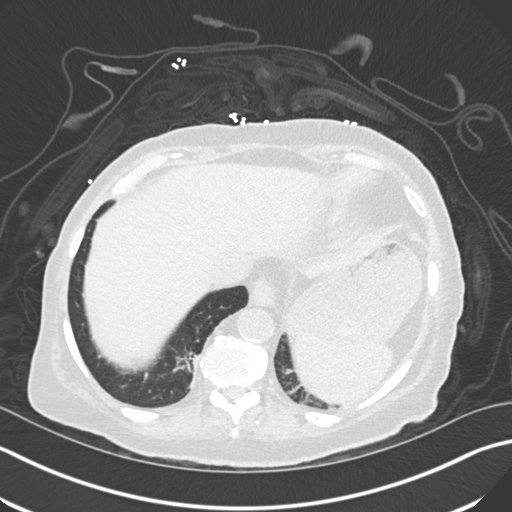
[im 61/165  mediastinal]
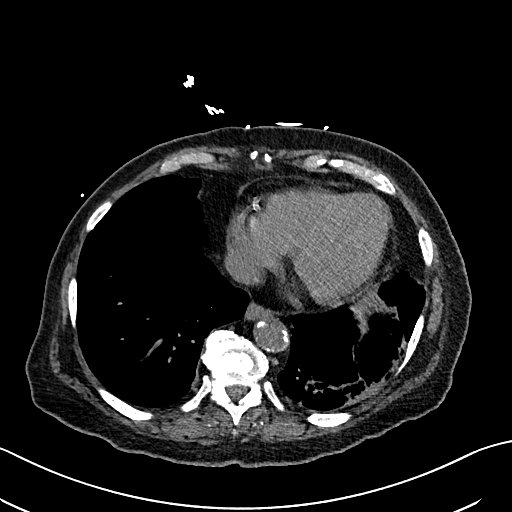
[im 61/165  lung]
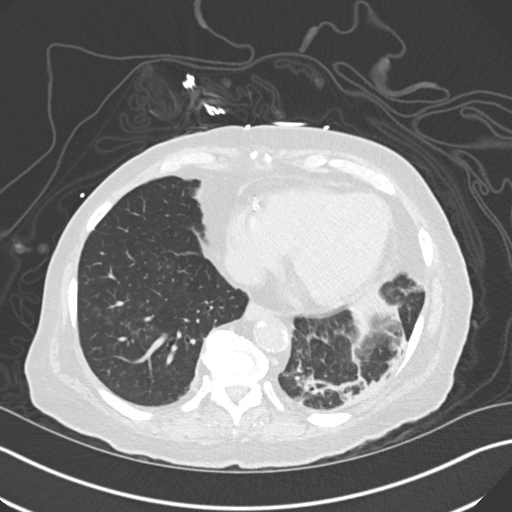
[im 73/165  lung]
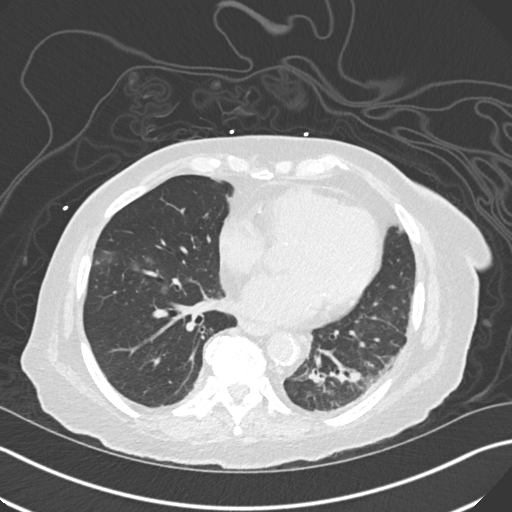
[im 92/165  lung]
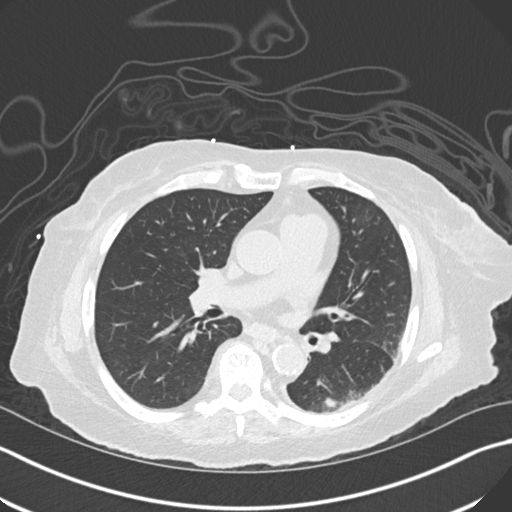
[im 104/165  lung]
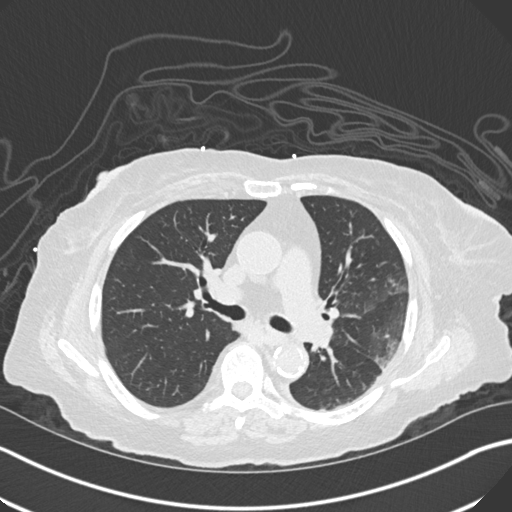
[im 116/165  mediastinal]
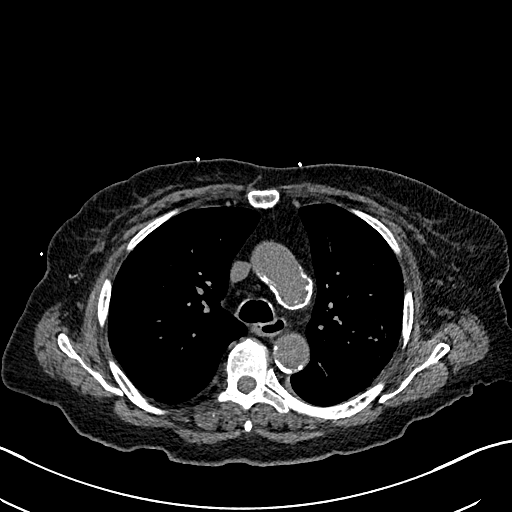
[im 116/165  lung]
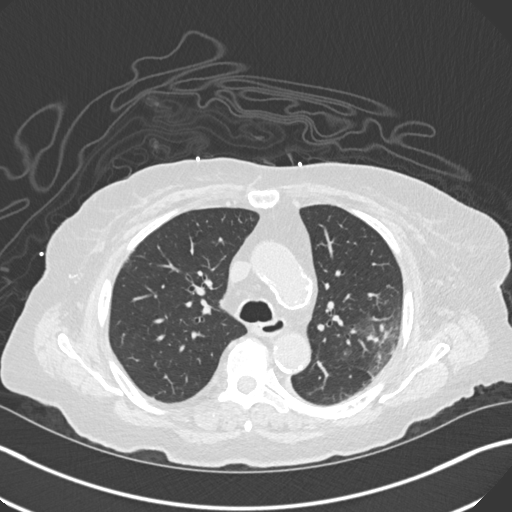
[im 128/165  lung]
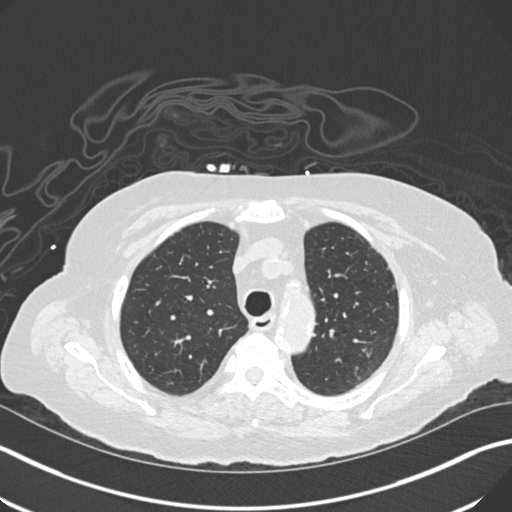
[im 140/165  lung]
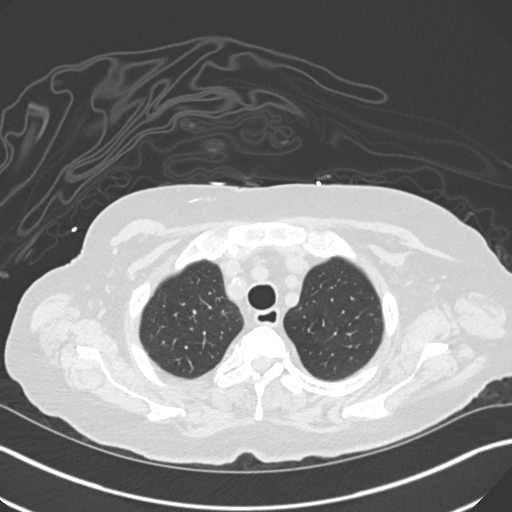
[im 152/165  lung]
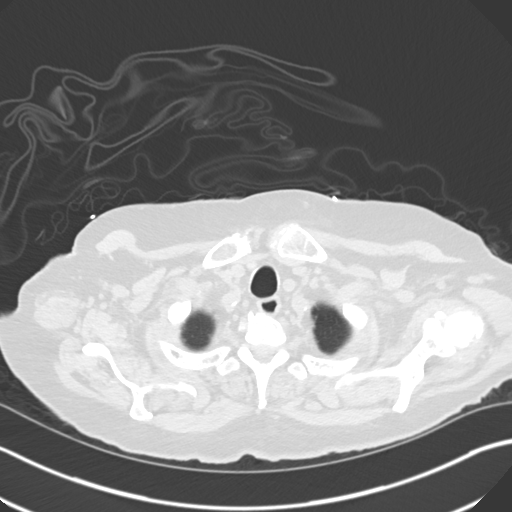

[Series 6: coronal · coronal · 0.64mm/px · 3 of 132 slices shown]
[im 27/132  lung]
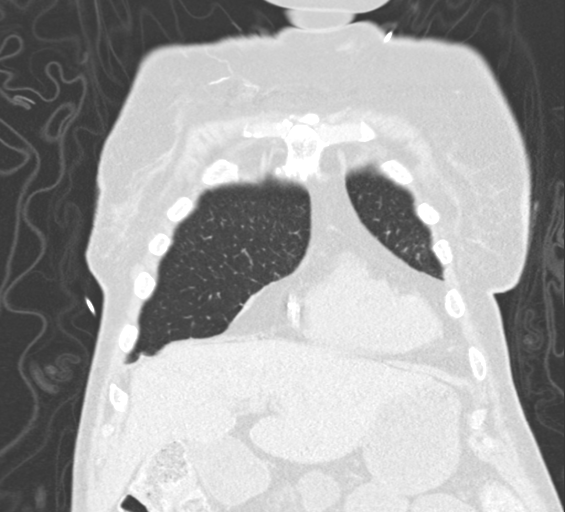
[im 53/132  lung]
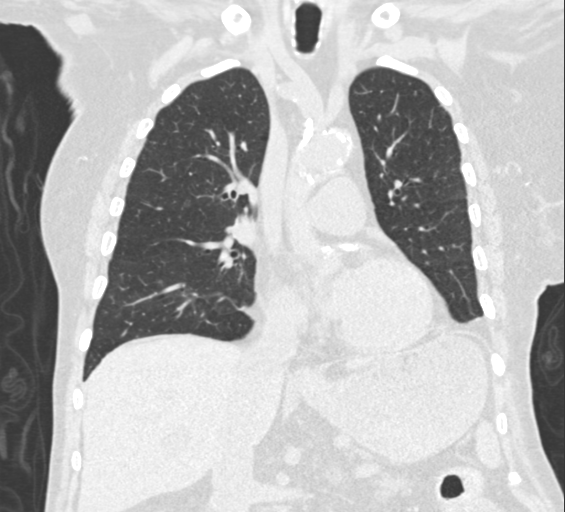
[im 79/132  lung]
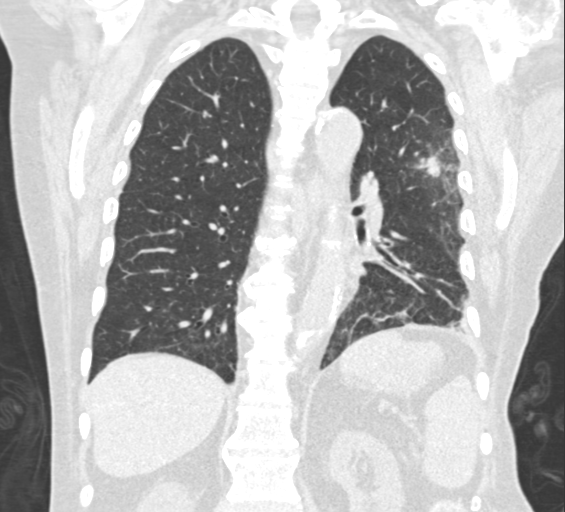

[15 of 36 positions shown; findings below may reference images not displayed]

FINDINGS: Cardiovascular: Normal heart size. No pericardial effusion. Diffuse
atherosclerosis of the aorta and coronaries.

Mediastinum/Nodes: Negative for adenopathy or mass.

Lungs/Pleura: There is patchy ground-glass nodularity in the right
lower lobe, clustered. Reticular densities in the left lower lobe
with volume loss and mucoid impaction. Secretions or debris is seen
in the bronchus intermedius.

In left upper lobe is a lobulated nodule measuring 25 by 12 mm on
axial slices, with few smaller adjacent nodules. This cluster of
nodularity favors an infectious process, but neoplasm with satellite
nodules is not excluded.

Upper Abdomen: Pancreatic cystic density mass as described on recent
abdominal CT. Short segment of intimal calcium displacement
involving the aorta at the hiatus, chronic since at least
09/25/2015, complex plaque versus focal dissection. No associated
aneurysm.

Musculoskeletal: No acute or aggressive finding.
IMPRESSION: 1. No obstructing lesion to explain recurrent pneumonia. There is
endobronchial debris and airspace opacities in the lower lobes,
would correlate for aspiration.
2. 24 x 14 mm nodule in the left upper lobe. This is 1 of multiple
clustered nodules, favoring an infectious process, but CT in 6-8
weeks is recommended to ensure resolution -neoplasm with satellite
nodules could also have this appearance.
3. Aortic Atherosclerosis ([id number]) that is extensive. Evidence
of chronic focal dissection at the aortic hiatus.

## 2018-09-09 IMAGING — RF DG SWALLOWING FUNCTION - [facility]
10 series · 20 of 24 positions shown · non-contrast
Comparison: none

[Series 1: cp_standard · 0.36mm/px · 2 of 88 frames shown (1 of 10)]
[frame 14/88]
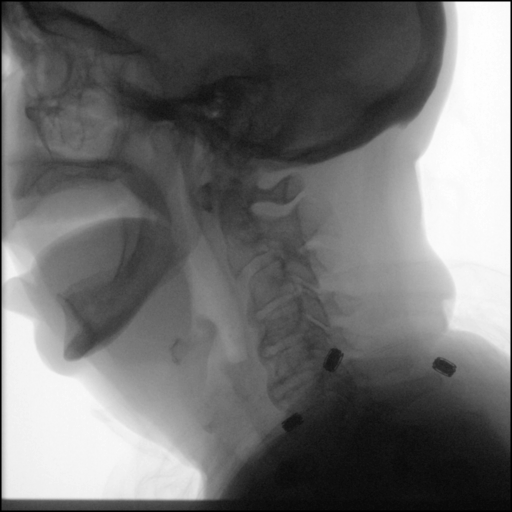
[frame 45/88]
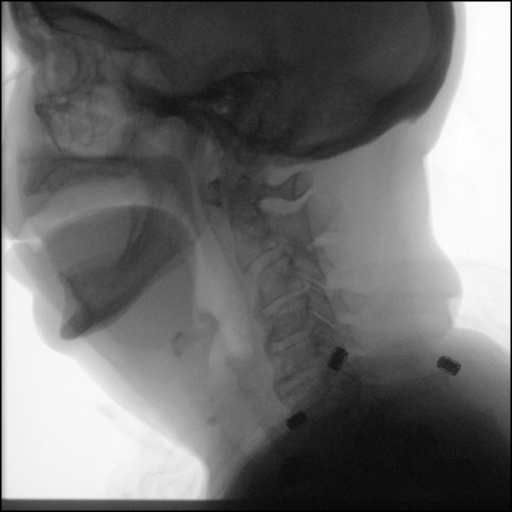

[Series 2: cp_standard · 0.36mm/px · 2 of 27 frames shown (2 of 10)]
[frame 14/27]
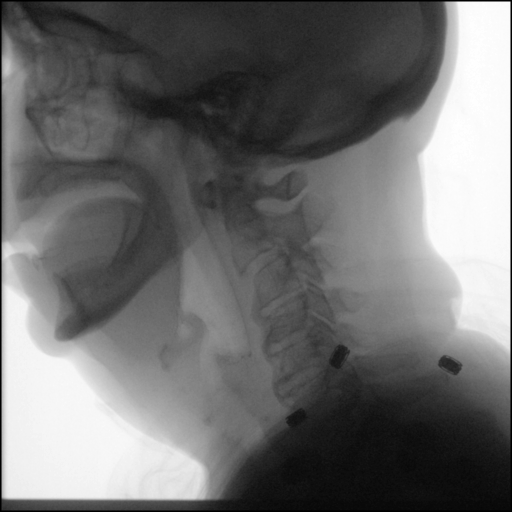
[frame 23/27]
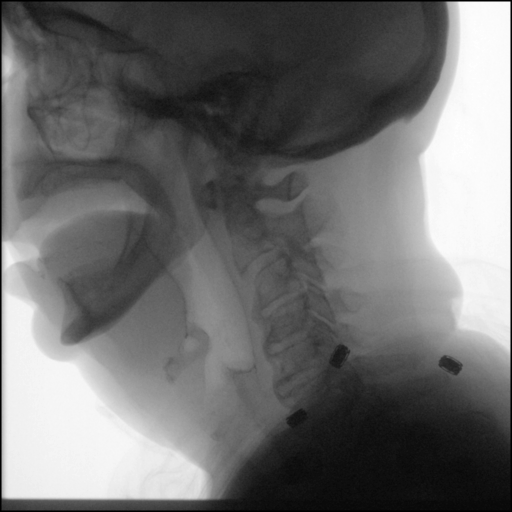

[Series 3: cp_standard · 0.36mm/px · 2 of 55 frames shown (3 of 10)]
[frame 9/55]
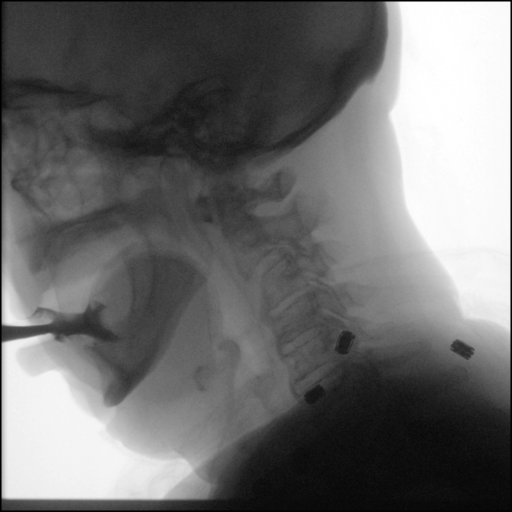
[frame 28/55]
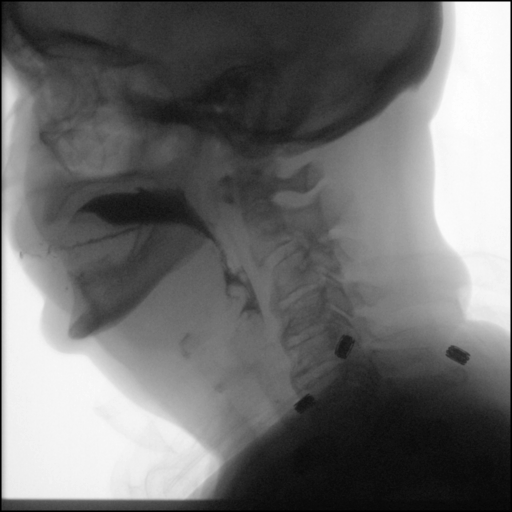

[Series 4: cp_standard · 0.36mm/px · 2 of 83 frames shown (4 of 10)]
[frame 2/83]
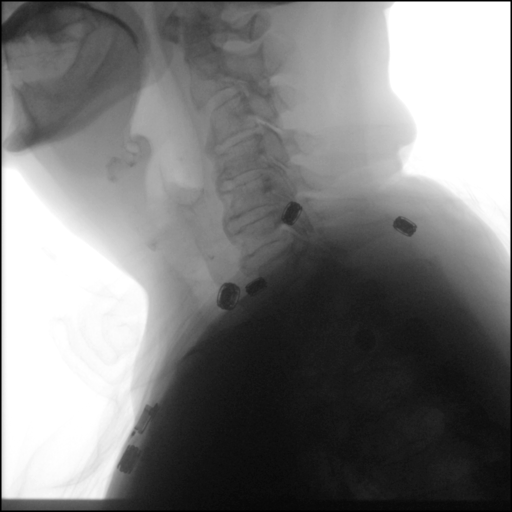
[frame 71/83]
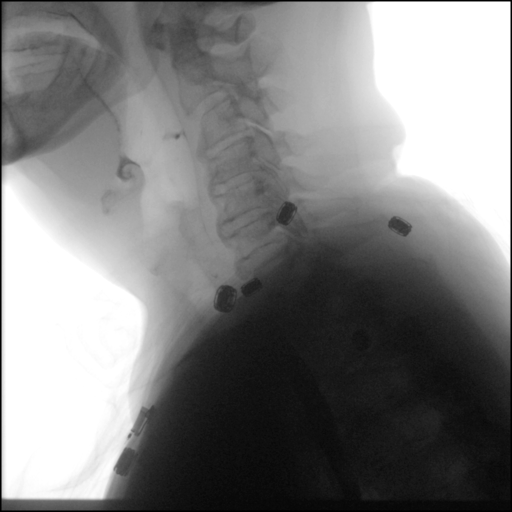

[Series 5: cp_standard · 0.36mm/px · 2 of 69 frames shown (5 of 10)]
[frame 11/69]
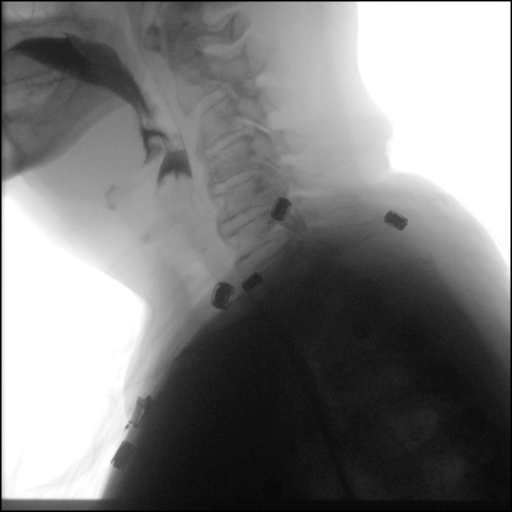
[frame 59/69]
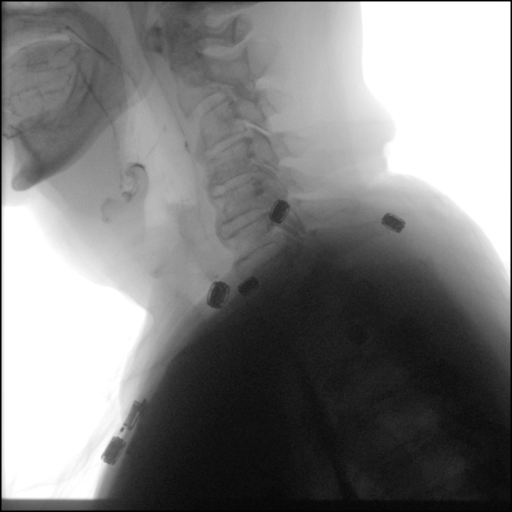

[Series 6: cp_standard · 0.36mm/px · 2 of 75 frames shown (6 of 10)]
[frame 12/75]
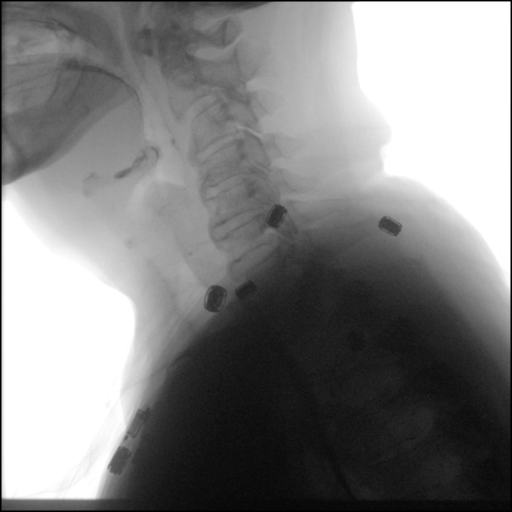
[frame 38/75]
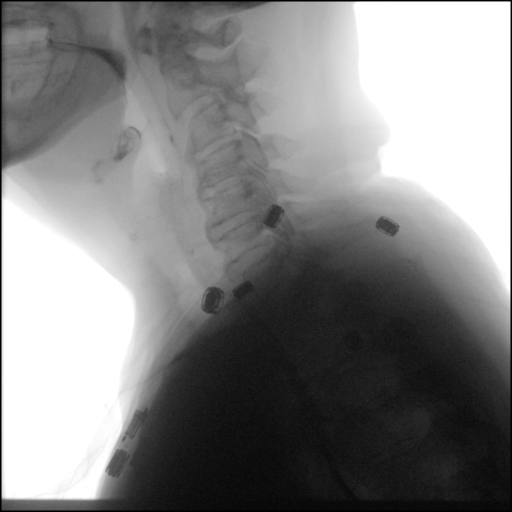

[Series 7: cp_standard · 0.36mm/px · 2 of 170 frames shown (7 of 10)]
[frame 86/170]
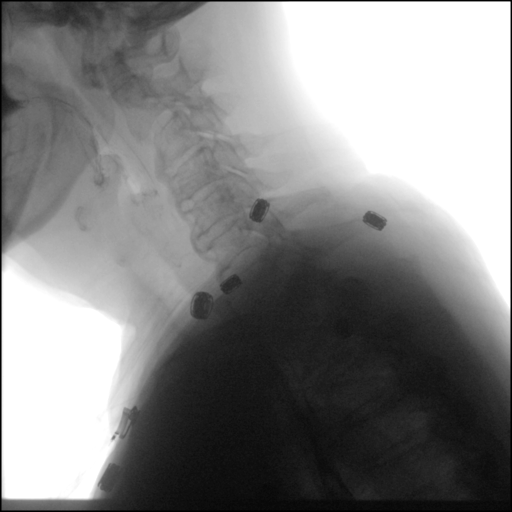
[frame 145/170]
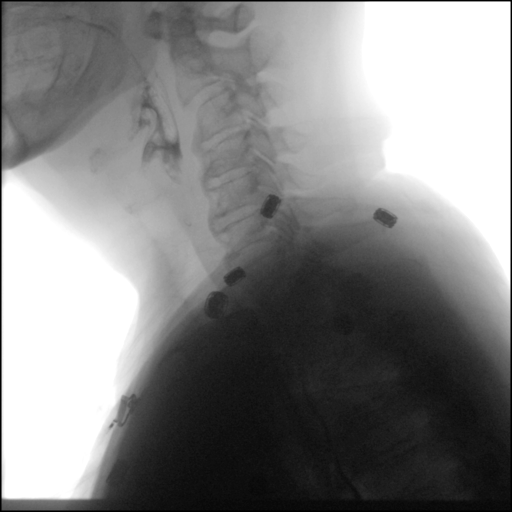

[Series 8: cp_standard · 0.36mm/px · 2 of 96 frames shown (8 of 10)]
[frame 23/96]
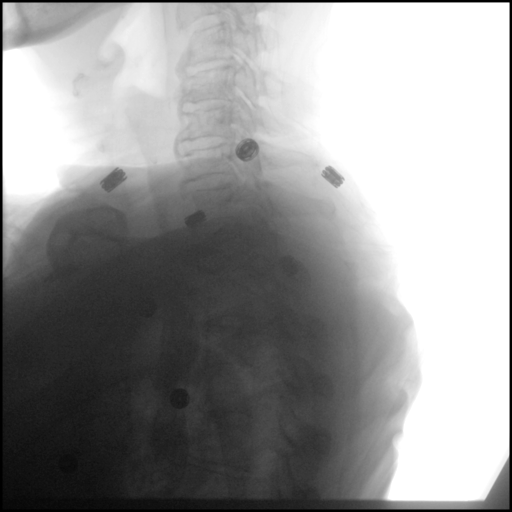
[frame 82/96]
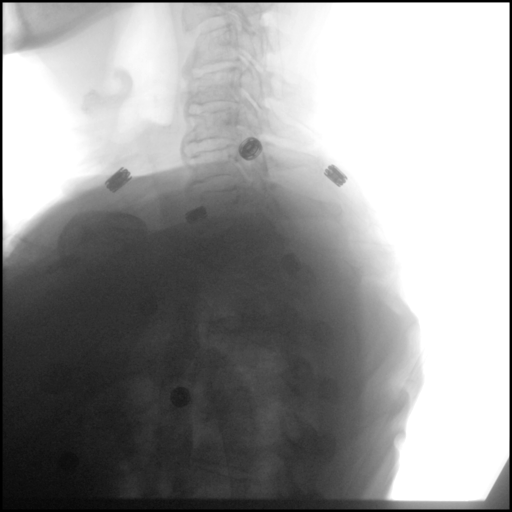

[Series 9: cp_standard · 0.36mm/px · 1 of 267 frames shown (9 of 10)]
[frame 41/267]
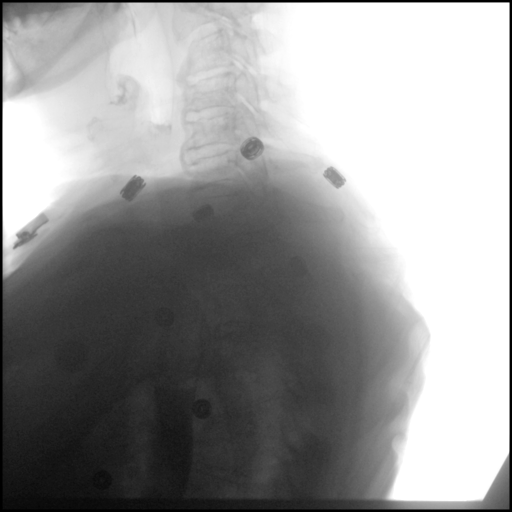

[Series 10: cp_standard · 0.36mm/px · 3 of 161 frames shown (10 of 10)]
[frame 25/161]
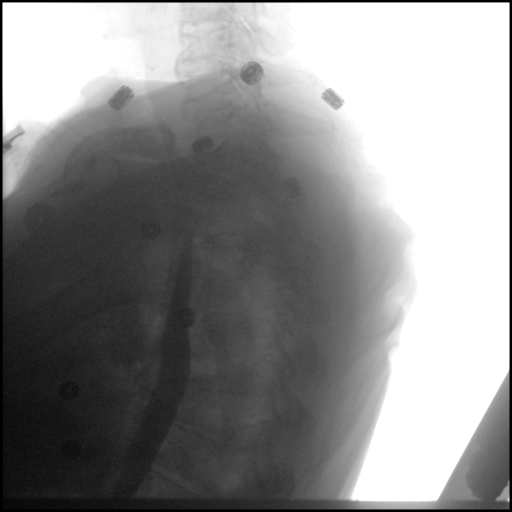
[frame 56/161]
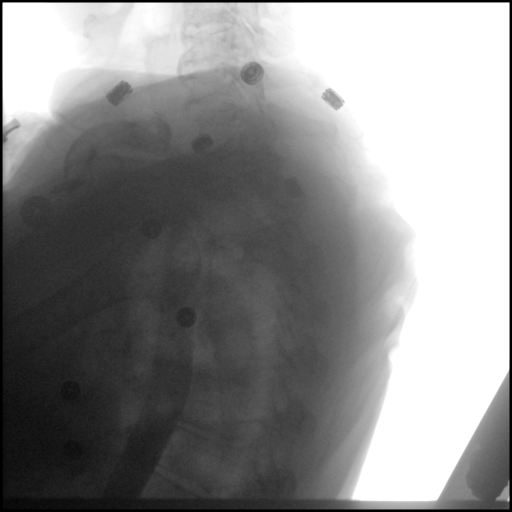
[frame 137/161]
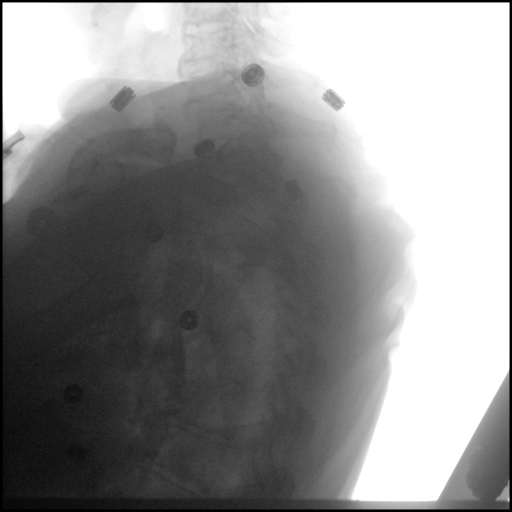

[20 of 24 positions shown; findings below may reference images not displayed]

FLUOROSCOPY FOR SWALLOWING FUNCTION STUDY:
Fluoroscopy was provided for swallowing function study, which was administered by a speech pathologist.  Final results and recommendations from this study are contained within the speech pathology report.

## 2018-09-19 IMAGING — CT CT CERVICAL SPINE W/O CM
4 of 8 series · 12 of 33 positions shown, 13 images · non-contrast
Comparison: None.

CLINICAL DATA: Status post fall, with posterior scalp hematoma.
Head hit pavement. Patient on blood thinners. Concern for head or
cervical spine injury. Initial encounter.

EXAM:
CT HEAD WITHOUT CONTRAST
CT CERVICAL SPINE WITHOUT CONTRAST
TECHNIQUE: Multidetector CT imaging of the head and cervical spine was
performed following the standard protocol without intravenous
contrast. Multiplanar CT image reconstructions of the cervical spine
were also generated.

[Series 6: coronal · coronal · 0.30mm/px · 1 of 67 slices shown]
[im 34/67  bone]
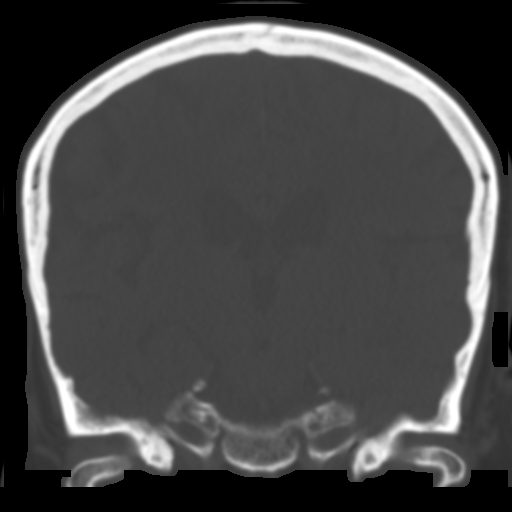

[Series 8: c-spine st · axial · 0.30mm/px · z∈[-266,-226]mm · 2 of 82 slices shown]
[im 21/82  bone]
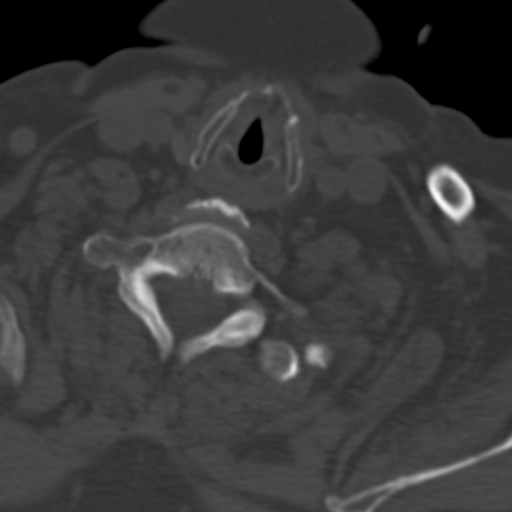
[im 41/82  bone]
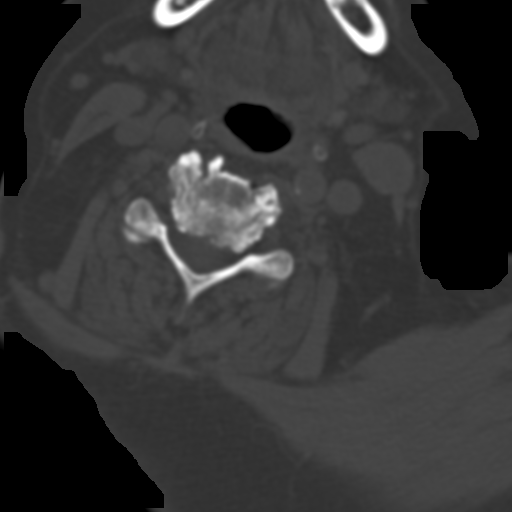

[Series 11: axial recon · axial · 0.18mm/px · z∈[-300,-197]mm · 4 of 94 slices shown, 5 images]
[im 19/94  soft-tissue]
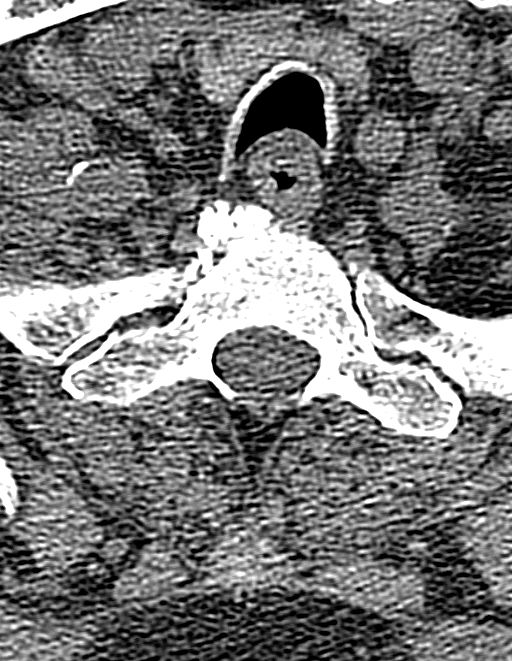
[im 19/94  bone]
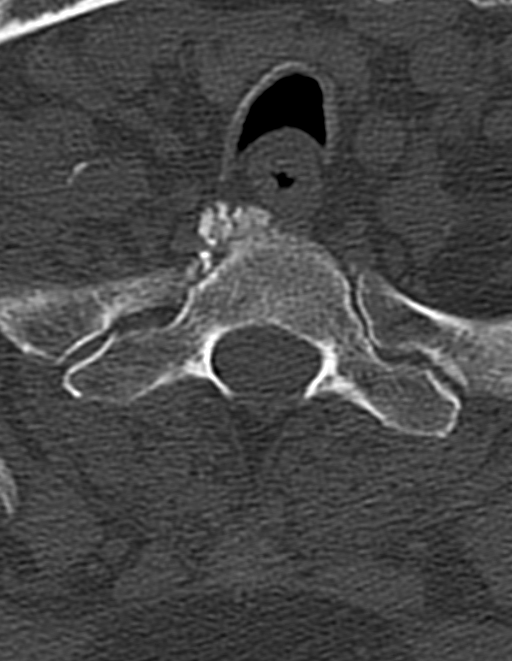
[im 38/94  bone]
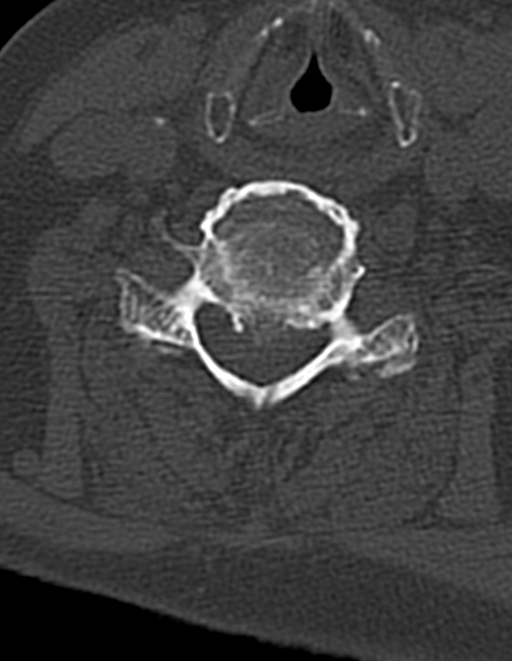
[im 56/94  bone]
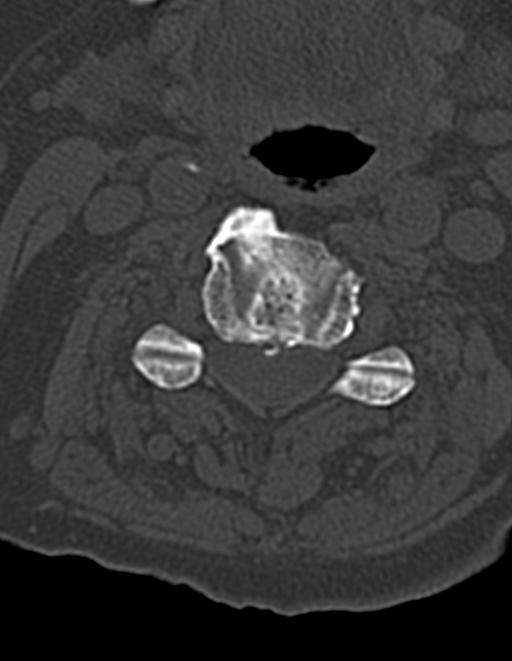
[im 75/94  bone]
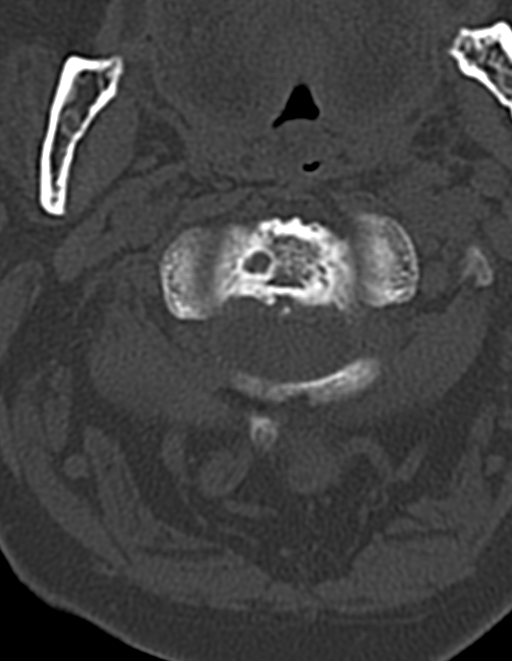

[Series 13: sagittal · sagittal · 0.24mm/px · 5 of 51 slices shown]
[im 9/51  bone]
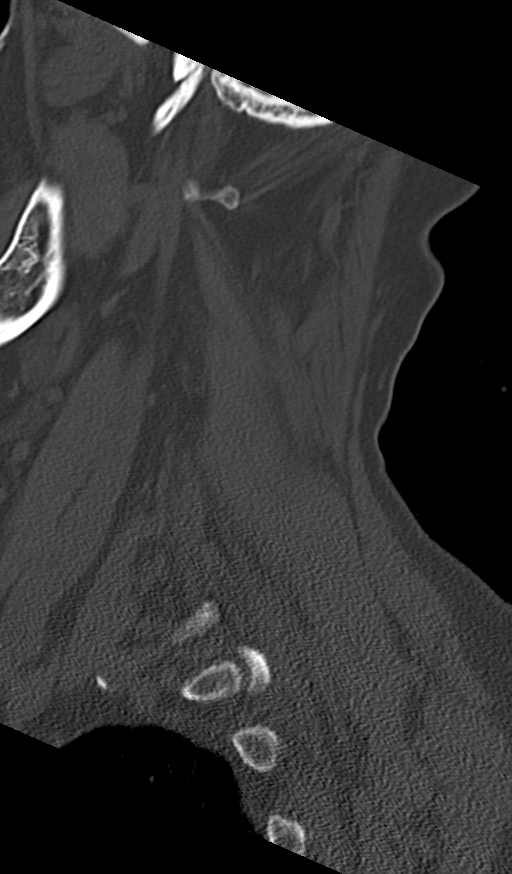
[im 17/51  bone]
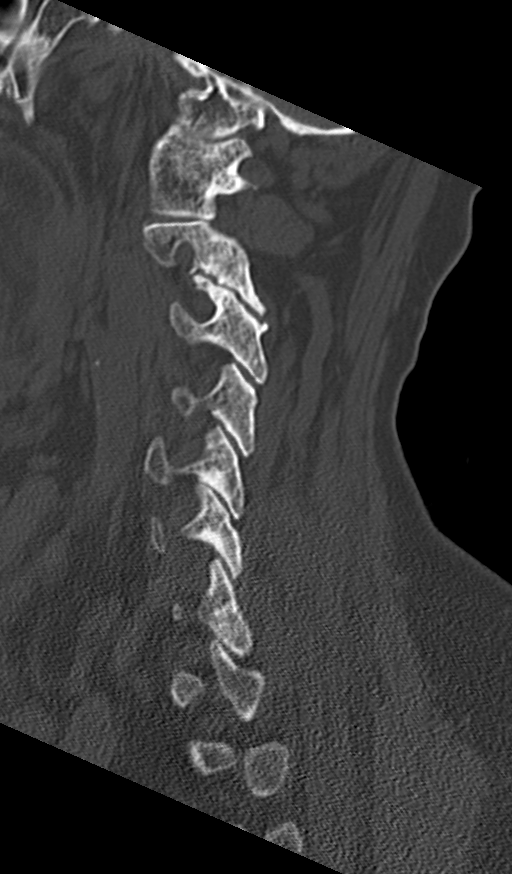
[im 26/51  bone]
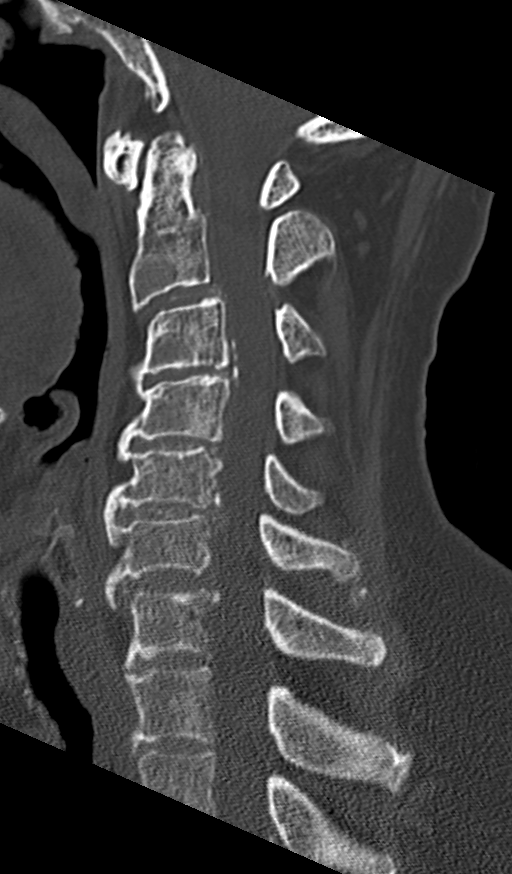
[im 34/51  bone]
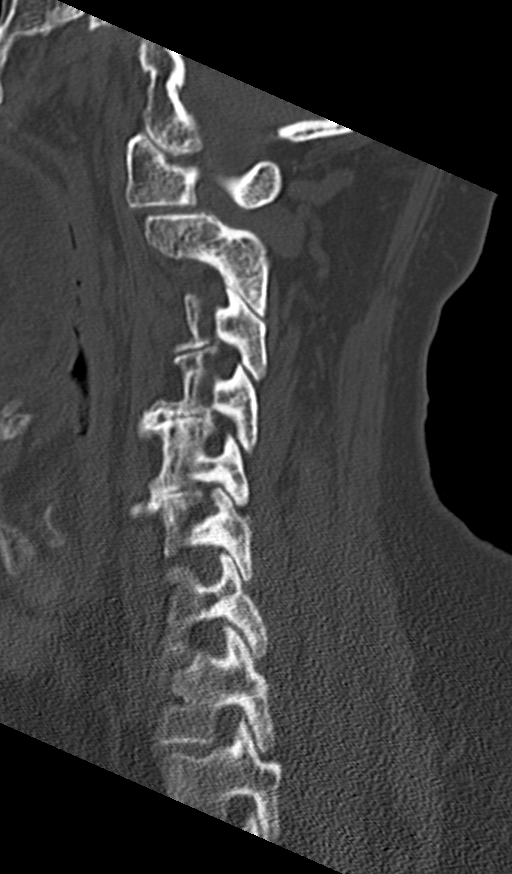
[im 42/51  bone]
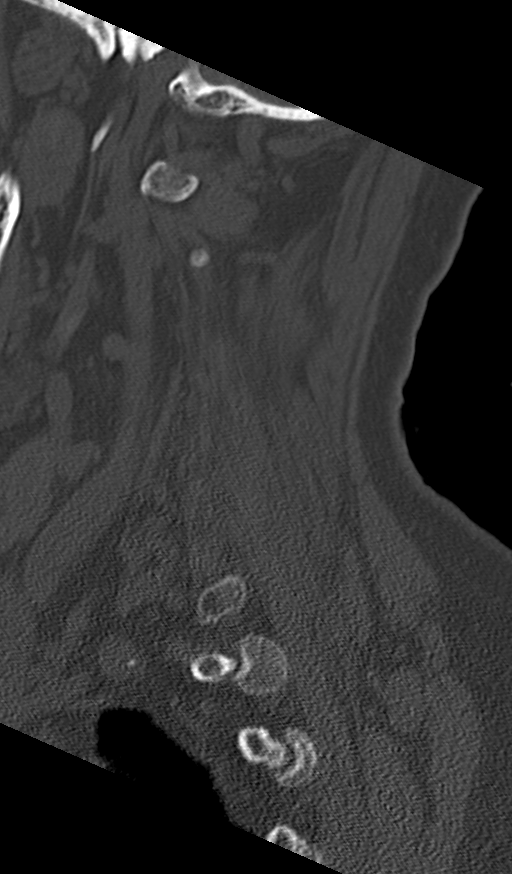

[12 of 33 positions shown; findings below may reference images not displayed]

FINDINGS: CT HEAD FINDINGS

Brain: No evidence of acute infarction, hemorrhage, hydrocephalus,
extra-axial collection or mass lesion/mass effect.

Prominence of the ventricles and sulci reflects mild to moderate
cortical volume loss. Scattered periventricular and subcortical
white matter change likely reflects small vessel ischemic
microangiopathy.

The brainstem and fourth ventricle are within normal limits. The
basal ganglia are unremarkable in appearance. The cerebral
hemispheres demonstrate grossly normal gray-white differentiation.
No mass effect or midline shift is seen.

Vascular: No hyperdense vessel or unexpected calcification.

Skull: There is no evidence of fracture; visualized osseous
structures are unremarkable in appearance.

Sinuses/Orbits: The visualized portions of the orbits are within
normal limits. There is mild partial opacification of the left
mastoid air cells. The paranasal sinuses and right mastoid air cells
are well-aerated.

Other: Soft tissue swelling and laceration are noted at the left
occiput.

CT CERVICAL SPINE FINDINGS

Alignment: There is mild grade 1 anterolisthesis of C2 on C3.

Skull base and vertebrae: No acute fracture. No primary bone lesion
or focal pathologic process.

Soft tissues and spinal canal: No prevertebral fluid or swelling. No
visible canal hematoma.

Disc levels: Mild multilevel disc space narrowing is noted along the
cervical spine, with anterior bridging osteophytes and scattered
posterior disc osteophyte complexes.

Upper chest: Mild tracheomalacia is suggested. The visualized lung
bases are clear. The thyroid gland is unremarkable in appearance.

Other: No additional soft tissue abnormalities are seen.
IMPRESSION: 1. No evidence of traumatic intracranial injury or fracture.
2. No evidence of fracture or subluxation along the cervical spine.
3. Soft tissue swelling and laceration at the left occiput.
4. Mild to moderate cortical volume loss and scattered small vessel
ischemic microangiopathy.
5. Mild degenerative change along the cervical spine.
6. Mild tracheomalacia suggested.
7. Mild partial opacification of the left mastoid air cells.

## 2018-10-20 IMAGING — CR DG CHEST 2V
2 series · 2 of 2 positions shown · non-contrast
Comparison: CT the chest 10/08/2016.

CLINICAL DATA: Cough.  Elevated white count.  Previous abnormal CT.

EXAM:
CHEST  2 VIEW

[w chest lat]
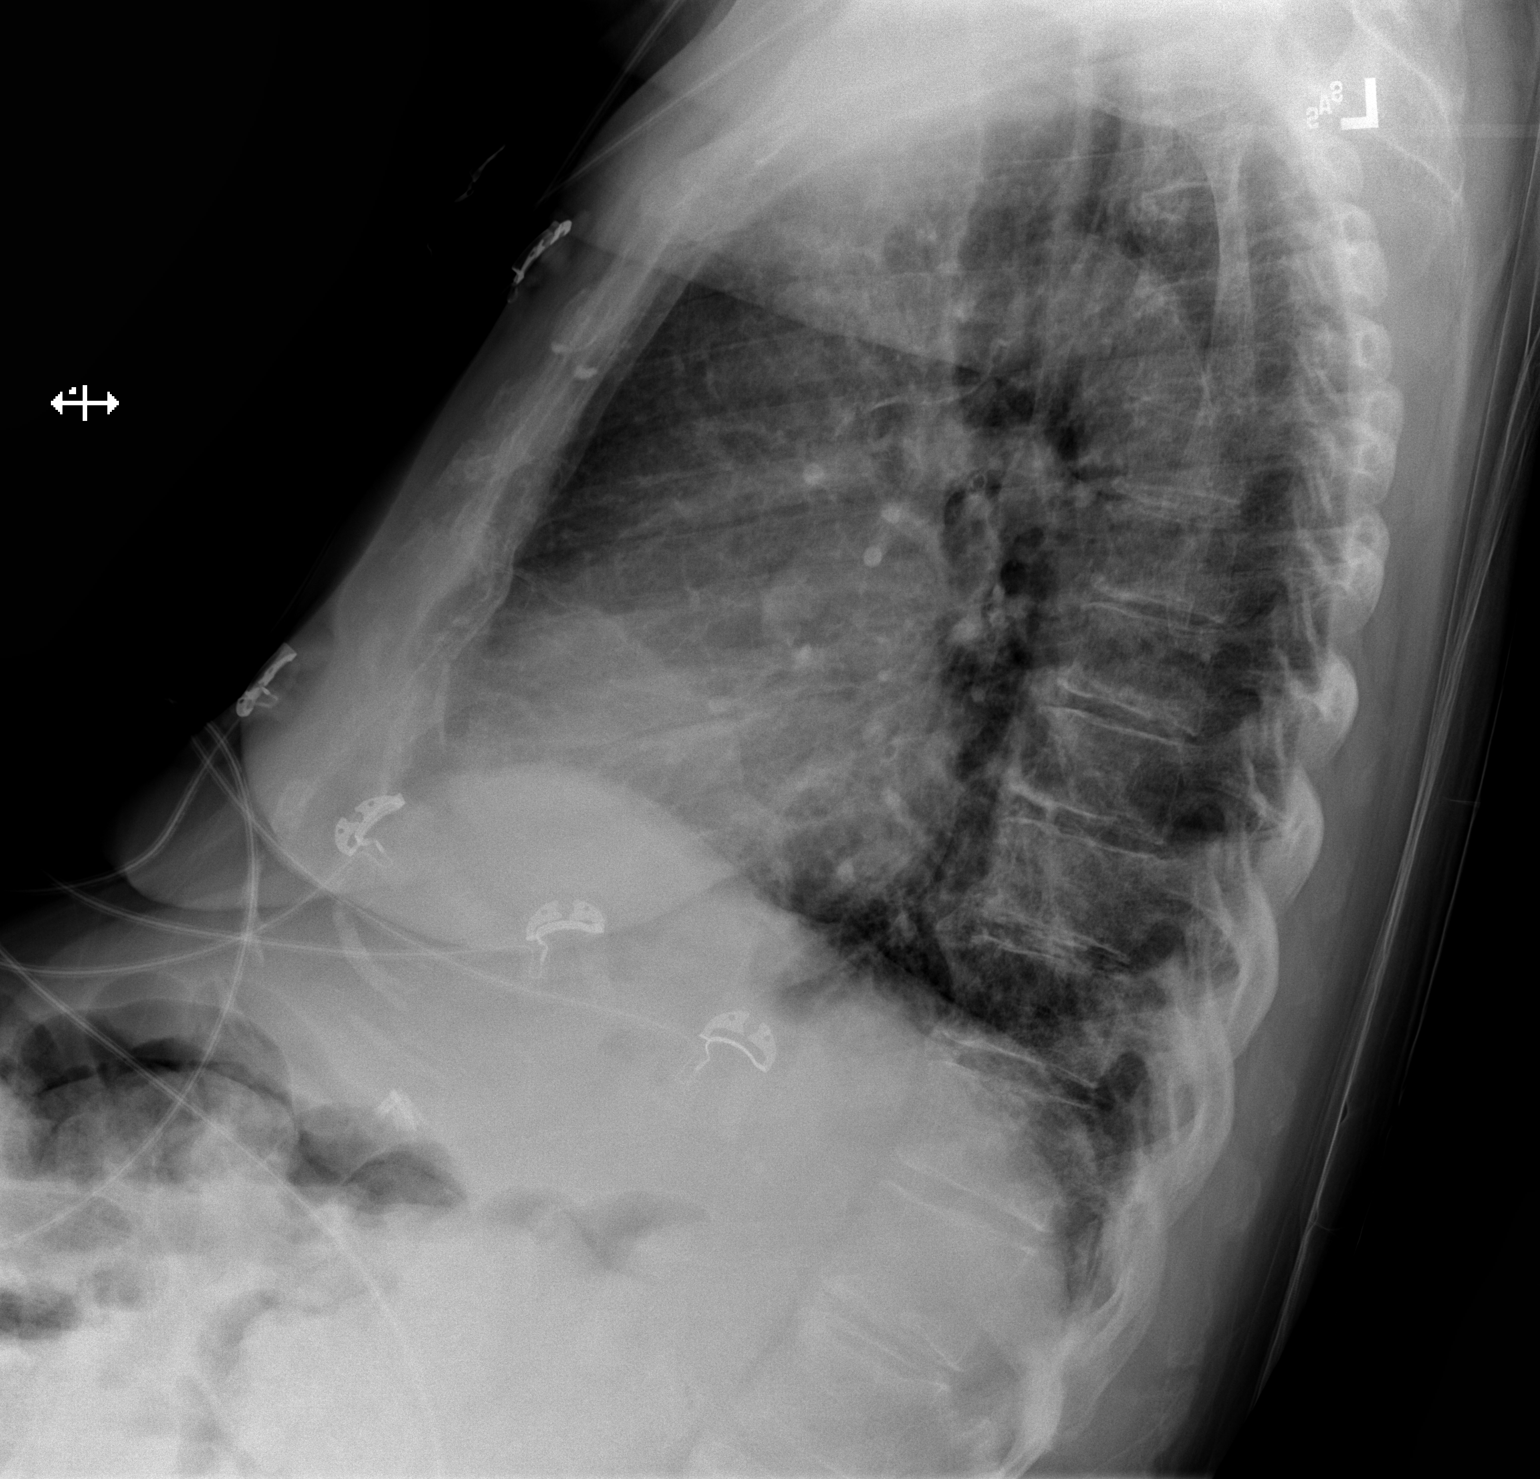

[x chest ap]
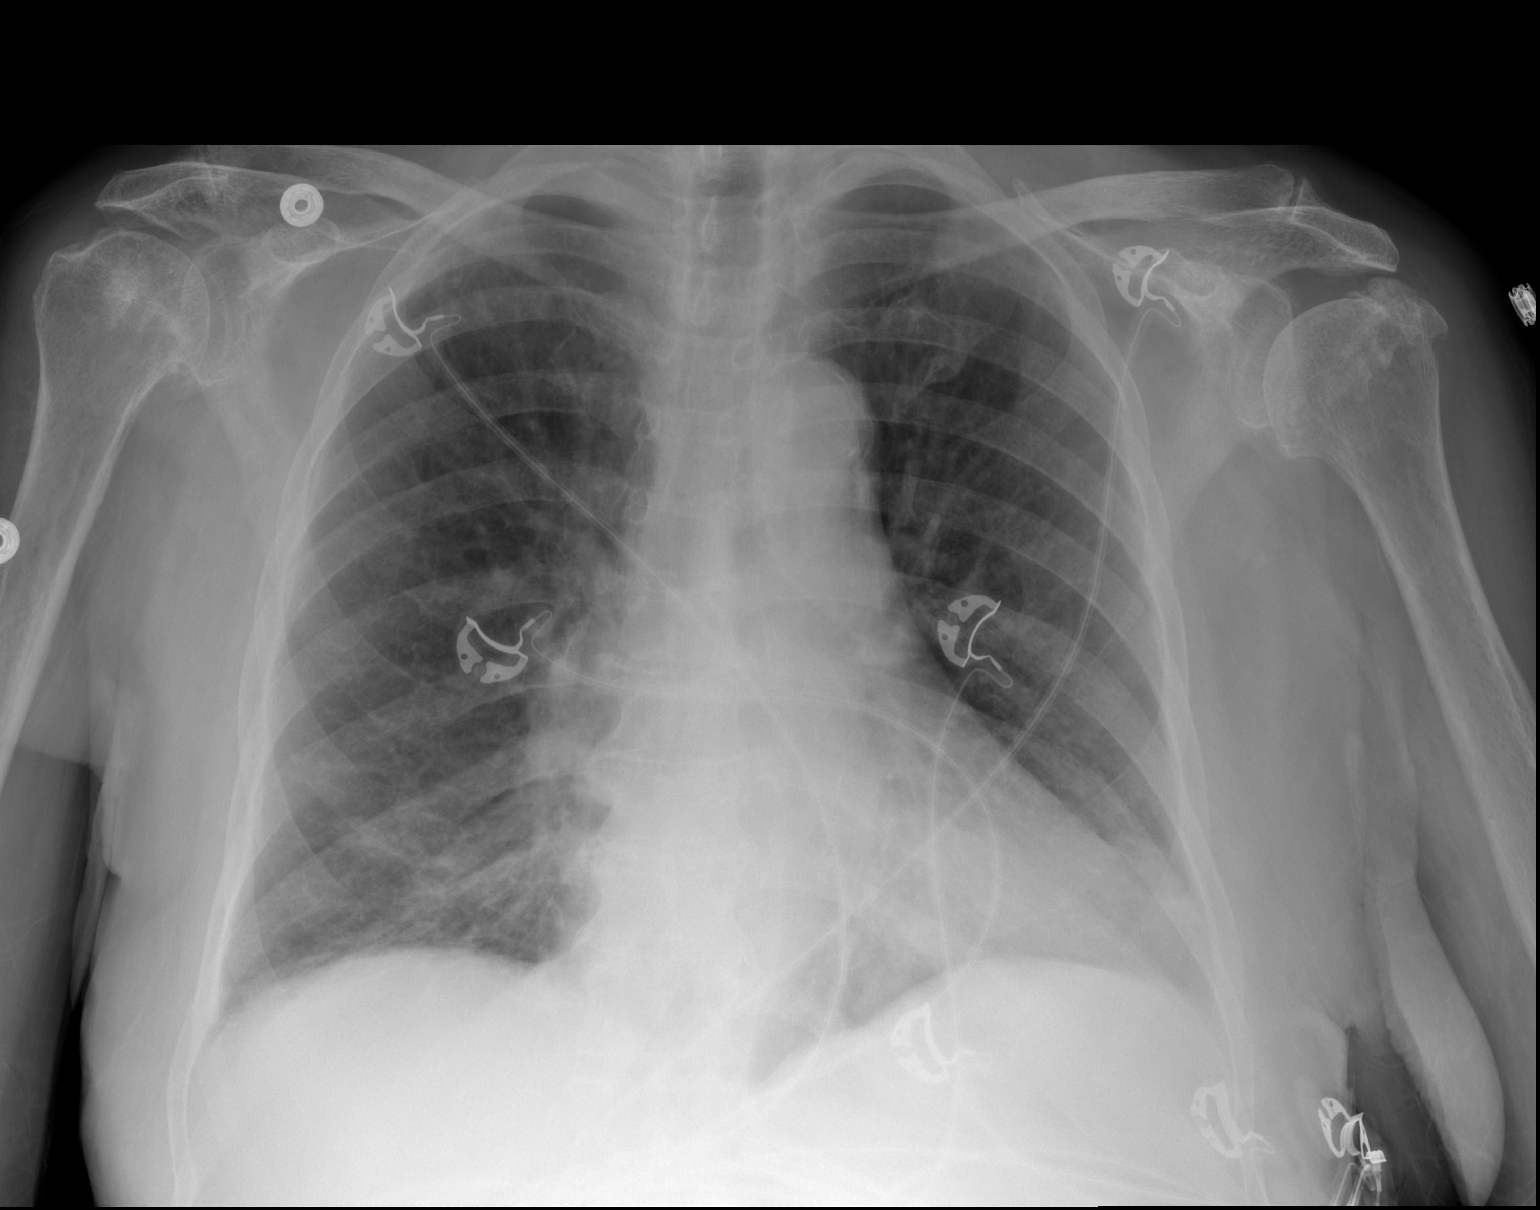

[2 of 2 positions shown; findings below may reference images not displayed]

FINDINGS: The heart is mildly enlarged without significant interval change.
Mild bibasilar airspace disease is less prominent than on prior
exams. There is no edema or effusion. There is no upper lobe
consolidation.

Advanced degenerative changes are again noted at the shoulders
bilaterally.
IMPRESSION: 1. Bibasilar airspace disease likely reflects atelectasis. No
significant consolidation or aspiration is evident on today's study
2. Mild cardiomegaly without failure.

## 2018-11-08 IMAGING — CT CT CHEST W/O CM
2 of 4 series · 15 of 36 positions shown, 18 images · non-contrast
Comparison: Atherosclerosis is seen throughout the thoracic aorta.
Focal aneurysmal dilatation is seen in the proximal aortic arch

CLINICAL DATA: Follow-up pulmonary nodule.

EXAM:
CT CHEST WITHOUT CONTRAST
TECHNIQUE: Multidetector CT imaging of the chest was performed following the
standard protocol without IV contrast.

[Series 2: chest w/(date) · axial · 0.63mm/px · z∈[-231,+27]mm · 12 of 149 slices shown, 15 images]
[im 10/149  mediastinal]
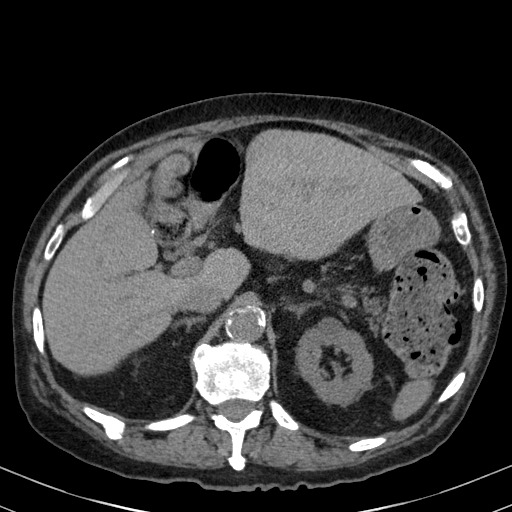
[im 10/149  lung]
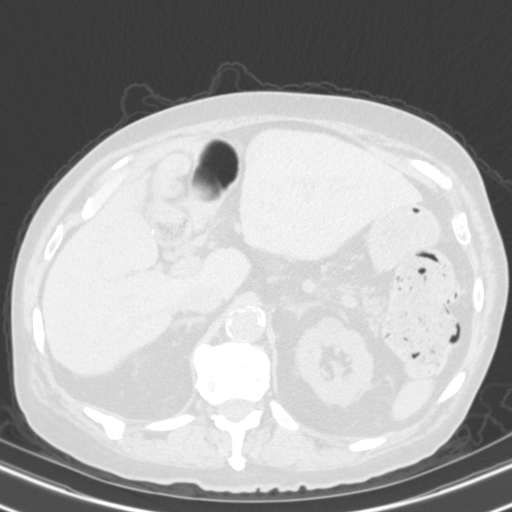
[im 20/149  lung]
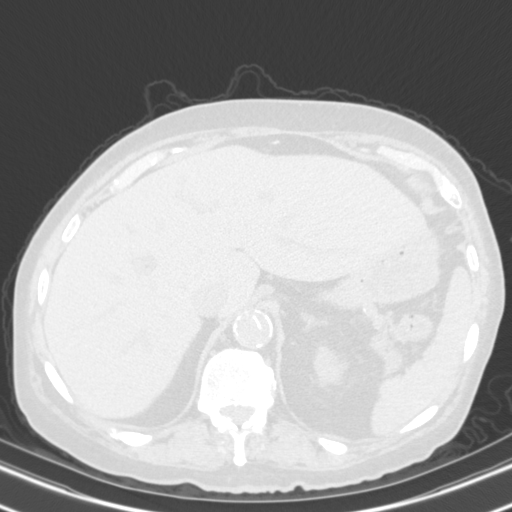
[im 30/149  lung]
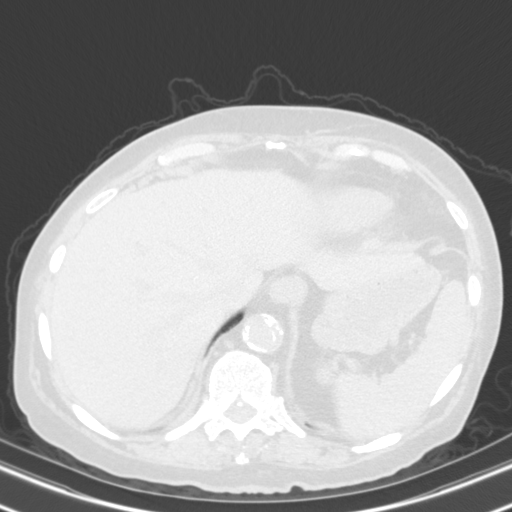
[im 50/149  lung]
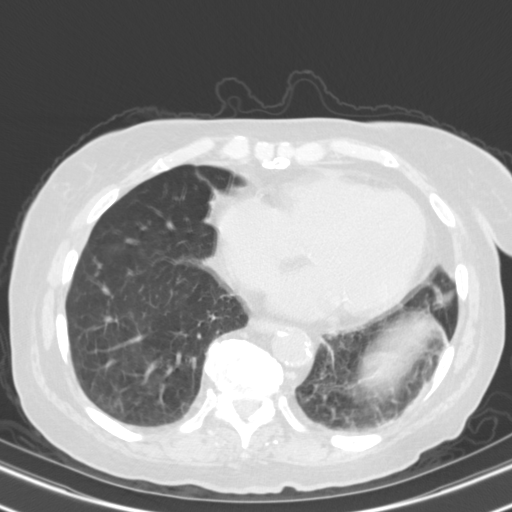
[im 60/149  mediastinal]
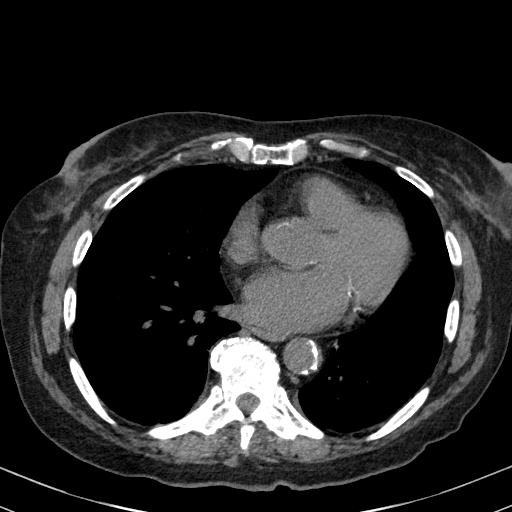
[im 60/149  lung]
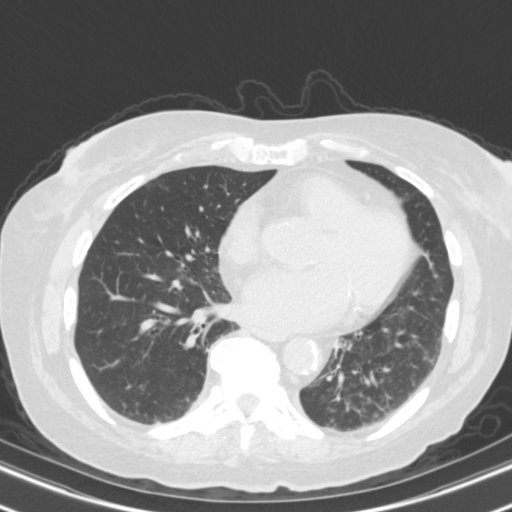
[im 70/149  lung]
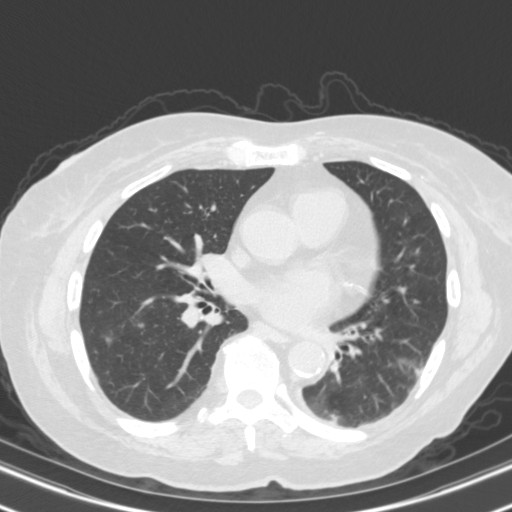
[im 79/149  lung]
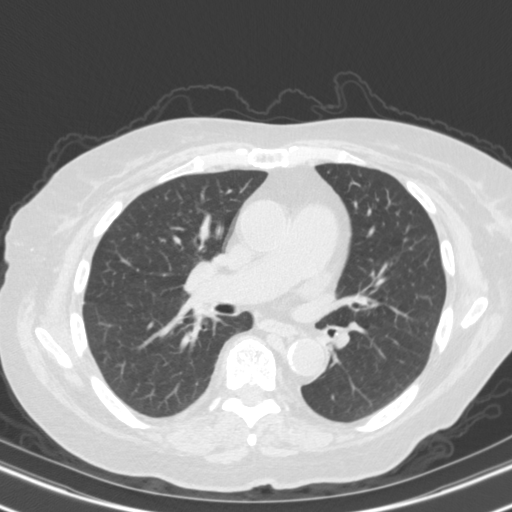
[im 89/149  lung]
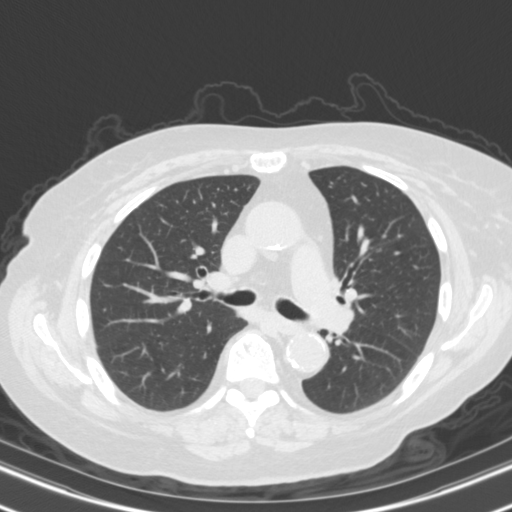
[im 99/149  mediastinal]
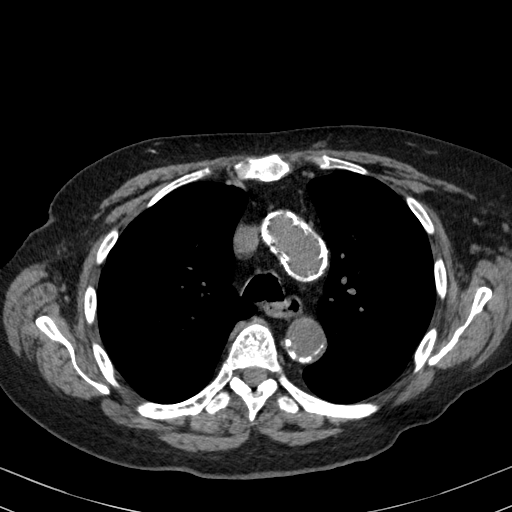
[im 99/149  lung]
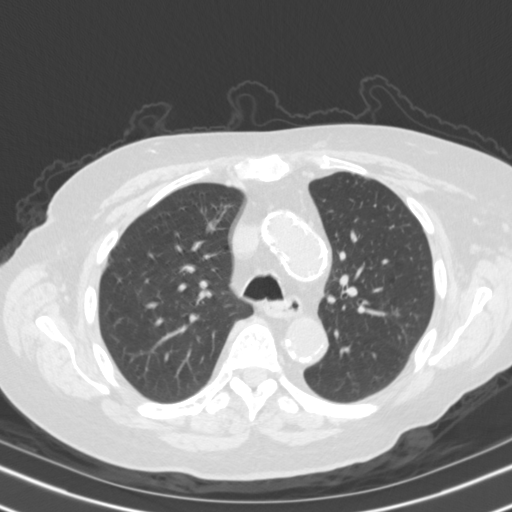
[im 119/149  lung]
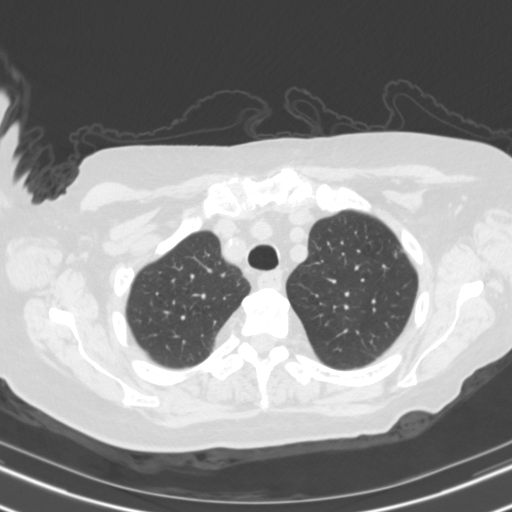
[im 129/149  lung]
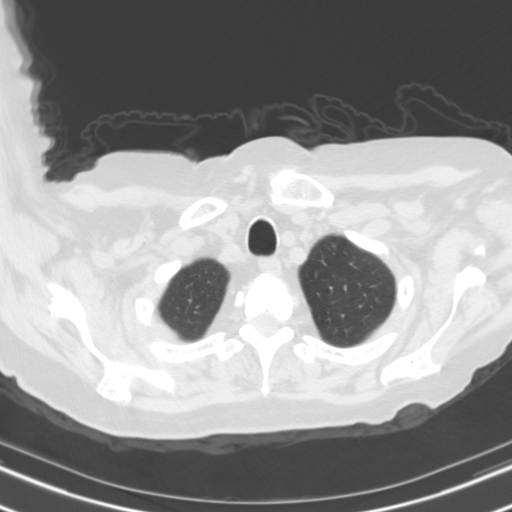
[im 139/149  lung]
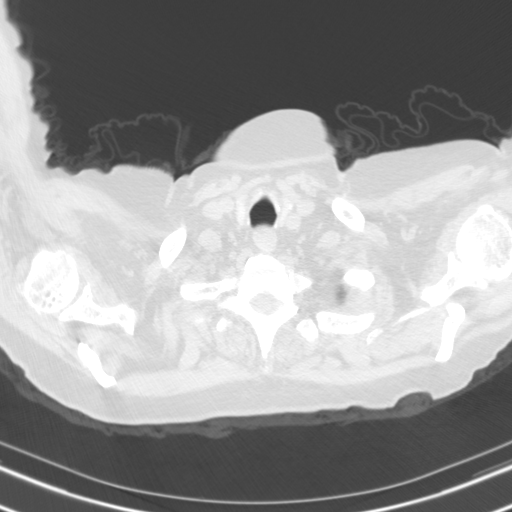

[Series 3: cor · coronal · 0.59mm/px · 3 of 124 slices shown]
[im 25/124  lung]
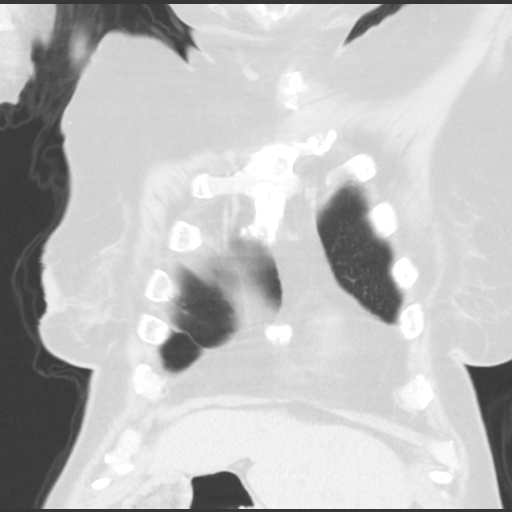
[im 50/124  lung]
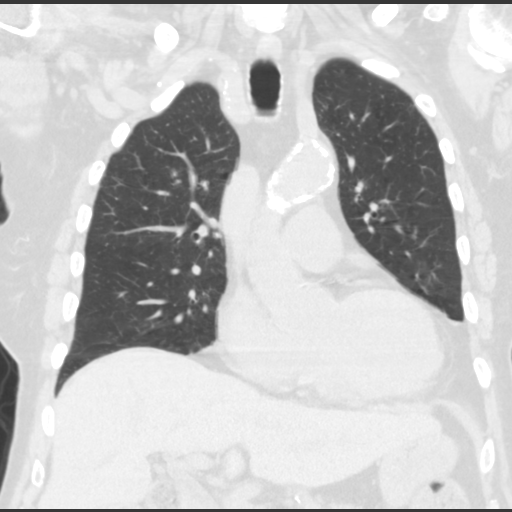
[im 74/124  lung]
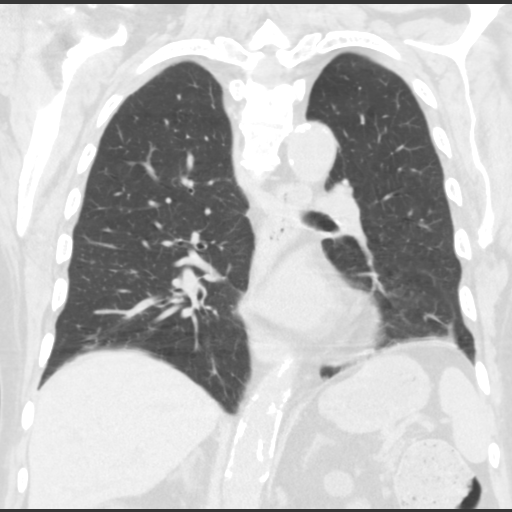

[15 of 36 positions shown; findings below may reference images not displayed]

FINDINGS: Cardiovascular: Atherosclerosis is seen in the thoracic aorta
without change. No aneurysm. Chronic dissection of the distal
thoracic aorta with a calcified flap, unchanged. Coronary artery
calcifications are noted. The heart is unchanged. The central
pulmonary arteries are normal.

Mediastinum/Nodes: No enlarged mediastinal or axillary lymph nodes.
Thyroid gland, trachea, and esophagus demonstrate no significant
findings.

Lungs/Pleura: Central airways are normal. No pneumothorax. A nodule
in the left upper lobe on the previous study has resolved. A nodule
on image 74 in the left lower lobe previously has resolved as well.
Two tiny nodules in the left lung base on images 86 and 96 were not
seen previously, possibly due to difference in slice selection.
Centrilobular nodularity in the left base on image 94 was not seen
previously, probably an airways process. The nodularity in the right
lung has improved but not completely resolved, likely an infectious
or inflammatory process.

Upper Abdomen: No changes in the upper abdomen.

Musculoskeletal: No chest wall mass or suspicious bone lesions
identified.
IMPRESSION: 1. The nodularity in the left upper lobe and much of the nodularity
in the right lung have resolved in the interval. However, there is
still remaining mild nodularity on the right in addition to 2 small
nodules in the left base. I suspect an infectious or inflammatory
process with interval improvement. Recommend a follow-up CT scan in
3 months to to ensure complete resolution or the stability.
2. Atherosclerosis in the thoracic aorta. Chronic dissection of the
distal thoracic aorta with a calcified flap.
3. Coronary artery calcifications.

Aortic Atherosclerosis ([id number]).

## 2018-12-17 IMAGING — CT CT ABD-PELV W/O CM
2 of 4 series · 15 of 46 positions shown, 17 images · non-contrast
Comparison: 10/07/2016

CLINICAL DATA: Pt is c/o diffuse abdominal pain associated with
distension and a hx of constipation and bowel obstruction. Adds that
she has multiple episodes of emesis and family administered an enema
this morning after which she had a significant bowel movement

EXAM:
CT ABDOMEN AND PELVIS WITHOUT CONTRAST
TECHNIQUE: Multidetector CT imaging of the abdomen and pelvis was performed
following the standard protocol without IV contrast.

[Series 2: abd/pel w/o · axial · non-contrast · 0.79mm/px · z∈[-620,-196]mm · 12 of 95 slices shown, 14 images]
[im 5/95  soft-tissue]
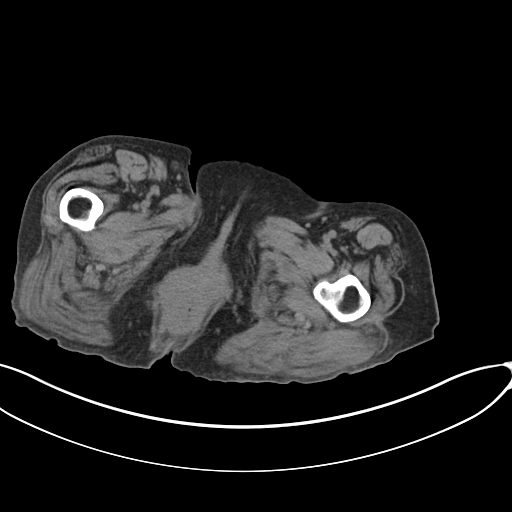
[im 5/95  bone]
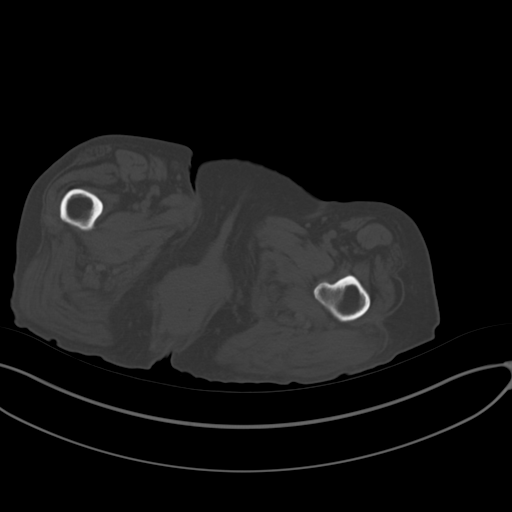
[im 14/95  soft-tissue]
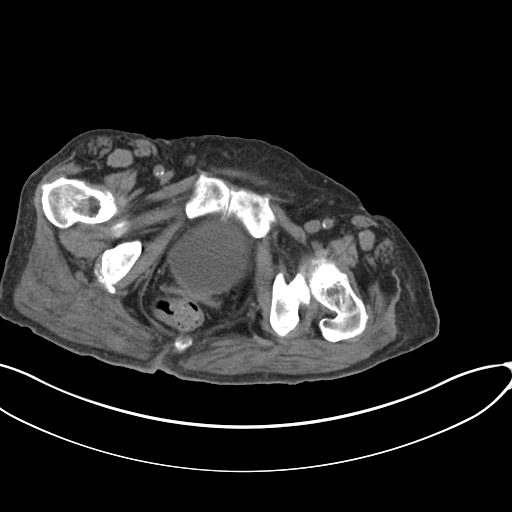
[im 23/95  soft-tissue]
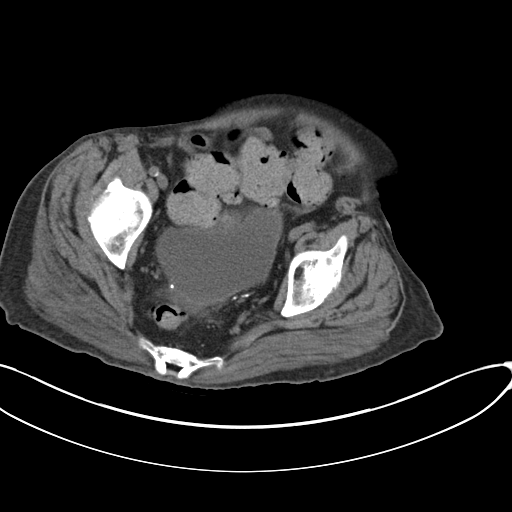
[im 27/95  soft-tissue]
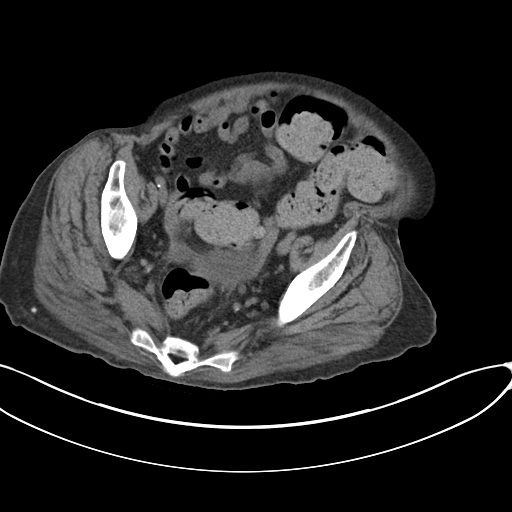
[im 36/95  soft-tissue]
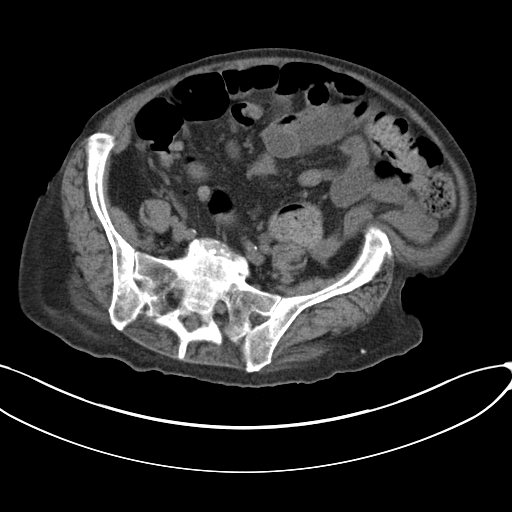
[im 45/95  soft-tissue]
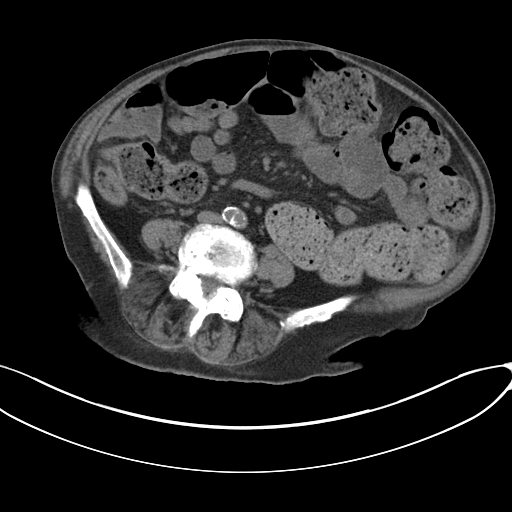
[im 50/95  soft-tissue]
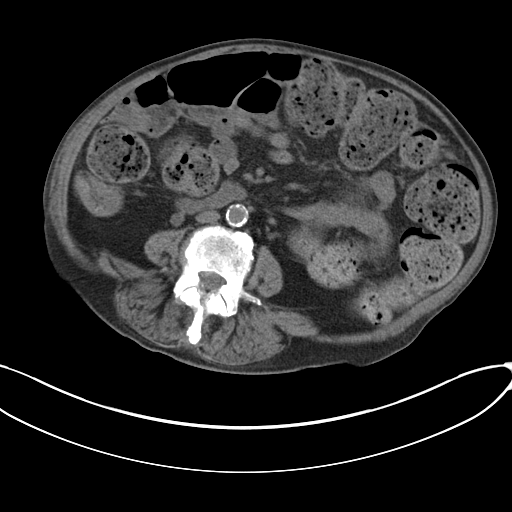
[im 59/95  soft-tissue]
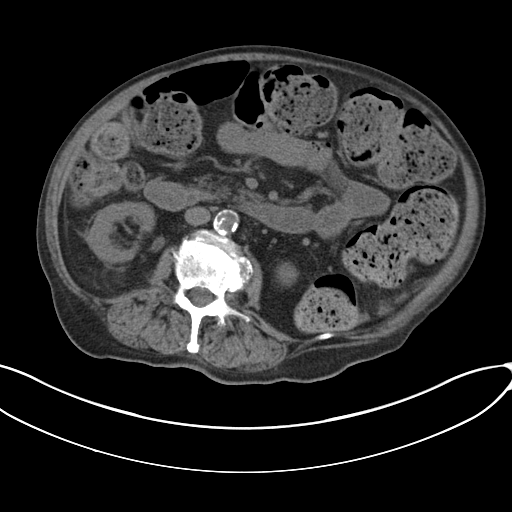
[im 68/95  soft-tissue]
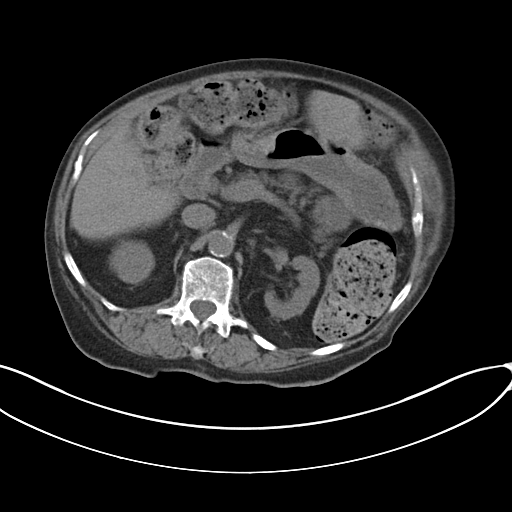
[im 68/95  bone]
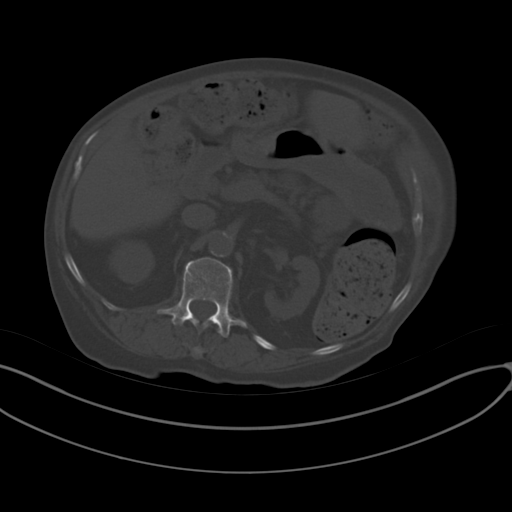
[im 72/95  soft-tissue]
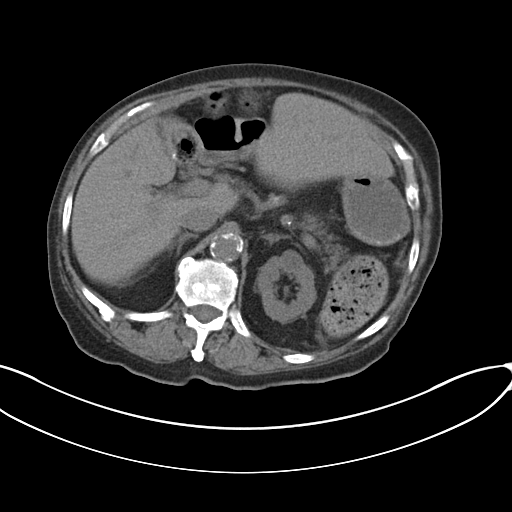
[im 81/95  soft-tissue]
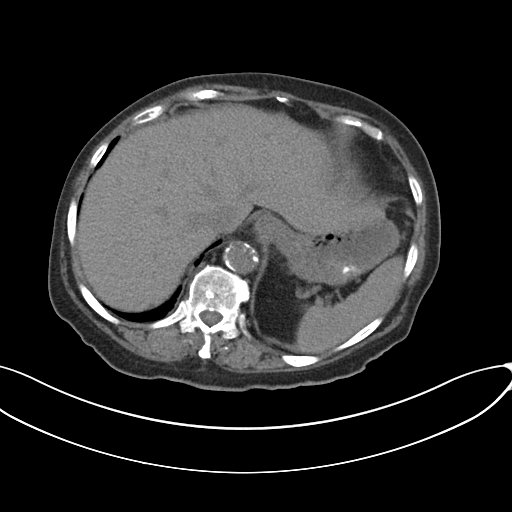
[im 90/95  soft-tissue]
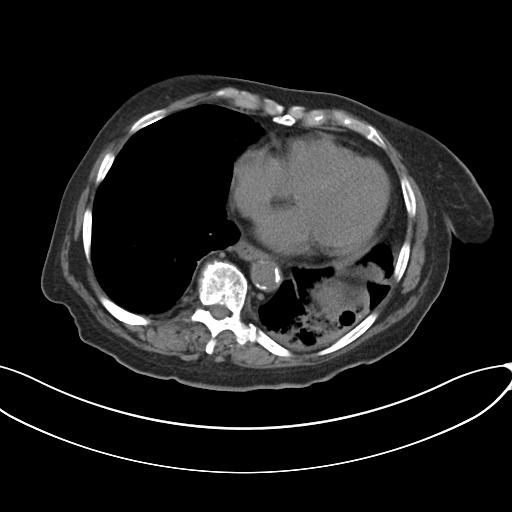

[Series 3: coronal · coronal · 0.74mm/px · 3 of 150 slices shown]
[im 50/150  soft-tissue]
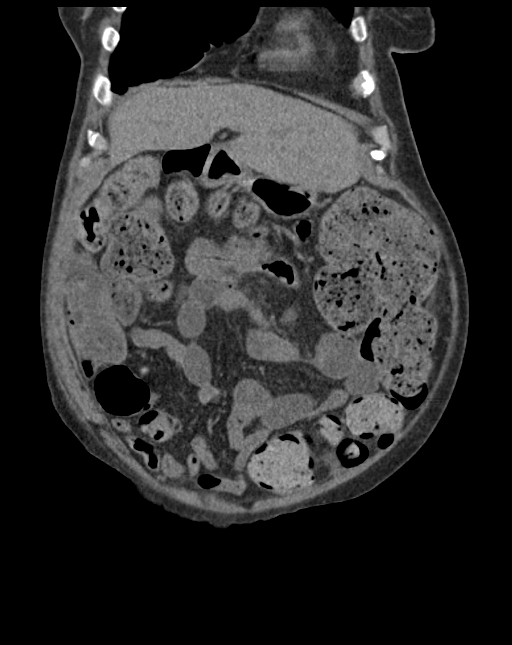
[im 67/150  soft-tissue]
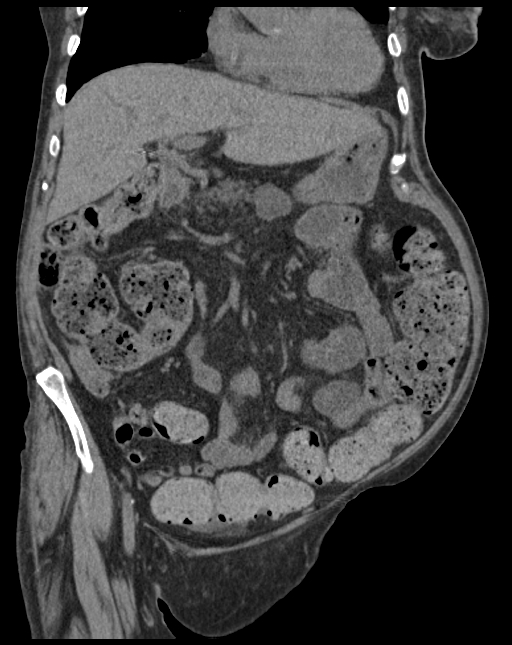
[im 83/150  soft-tissue]
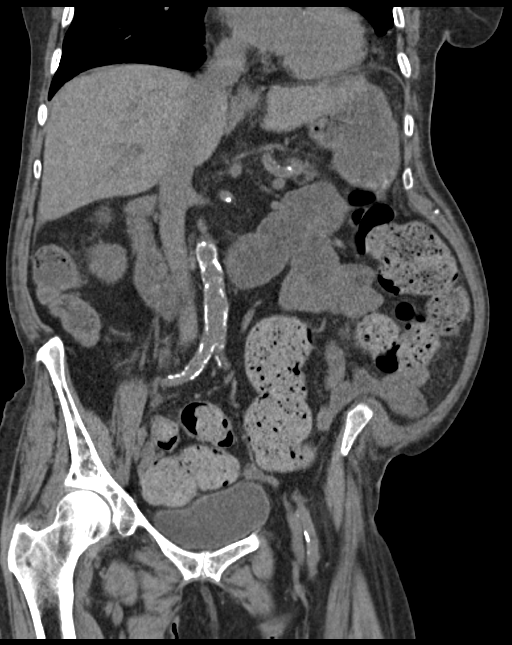

[15 of 46 positions shown; findings below may reference images not displayed]

FINDINGS: Lower chest: There is patchy airspace filling opacity in the left
lower lobe, bibasilar scarring is again identified, left greater
than right. No new or suspicious findings in the lung bases.
Coronary artery calcifications and stent are present.

Hepatobiliary: Status post cholecystectomy. The liver is
homogeneous.

Pancreas: No pancreatic ductal dilatation or surrounding
inflammatory changes. There is a fluid attenuation mass within the
pancreatic body measuring 2.8 x 2.2 cm. Mass appears stable compared
to the most recent study but demonstrates growth since study in
0515.

Spleen: Normal in size without focal abnormality.

Adrenals/Urinary Tract: The adrenal glands are normal. There are
bilateral renal cysts. No hydronephrosis or ureteral obstruction.
Urinary bladder is normal in appearance.

Stomach/Bowel: The stomach is normal in appearance. Small bowel
loops are normal in appearance. The appendix is well seen and has a
normal appearance.

There is marked distension of the entire colon by solid stool. The
colon is tortuous and distended to level of the cecum.

Vascular/Lymphatic: There is atherosclerotic calcification of the
abdominal aorta not associated with aneurysm. There is significant
calcified mural thrombus, stable in appearance and best seen on
image 21 of series 2. No retroperitoneal or mesenteric adenopathy.

Reproductive: Status post hysterectomy.  No adnexal mass.

Other: None

Musculoskeletal: Significant degenerative changes in the lower
thoracic in lumbar spine. There is 5 mm retrolisthesis associated
with significant disc height loss at L2-3. Vacuum disc identified at
L4-5 and L5-S1. No suspicious lytic or blastic lesions are
identified.
IMPRESSION: 1. Distended tortuous colon containing a large amount of stool,
likely related to the patient's symptoms of abdominal pain.
2. 2.8 cm cystic pancreatic lesion slowly developing since 0515 and
warrants further evaluation. By consensus criteria, referral for
possible endoscopic ultrasound /FNA and surgical consultation is
recommended.
3. Scarring at both lung bases.
4. Coronary artery disease.
5. Status post cholecystectomy.
6.  Aortic atherosclerosis.  ([id number])

## 2018-12-17 IMAGING — CR DG CHEST 2V
2 series · 2 of 2 positions shown · non-contrast
Comparison: 11/19/2016 CXR, 12/08/2016 CT

CLINICAL DATA: Diffuse abdominal pain associated with distention.

EXAM:
CHEST  2 VIEW

[w chest lat]
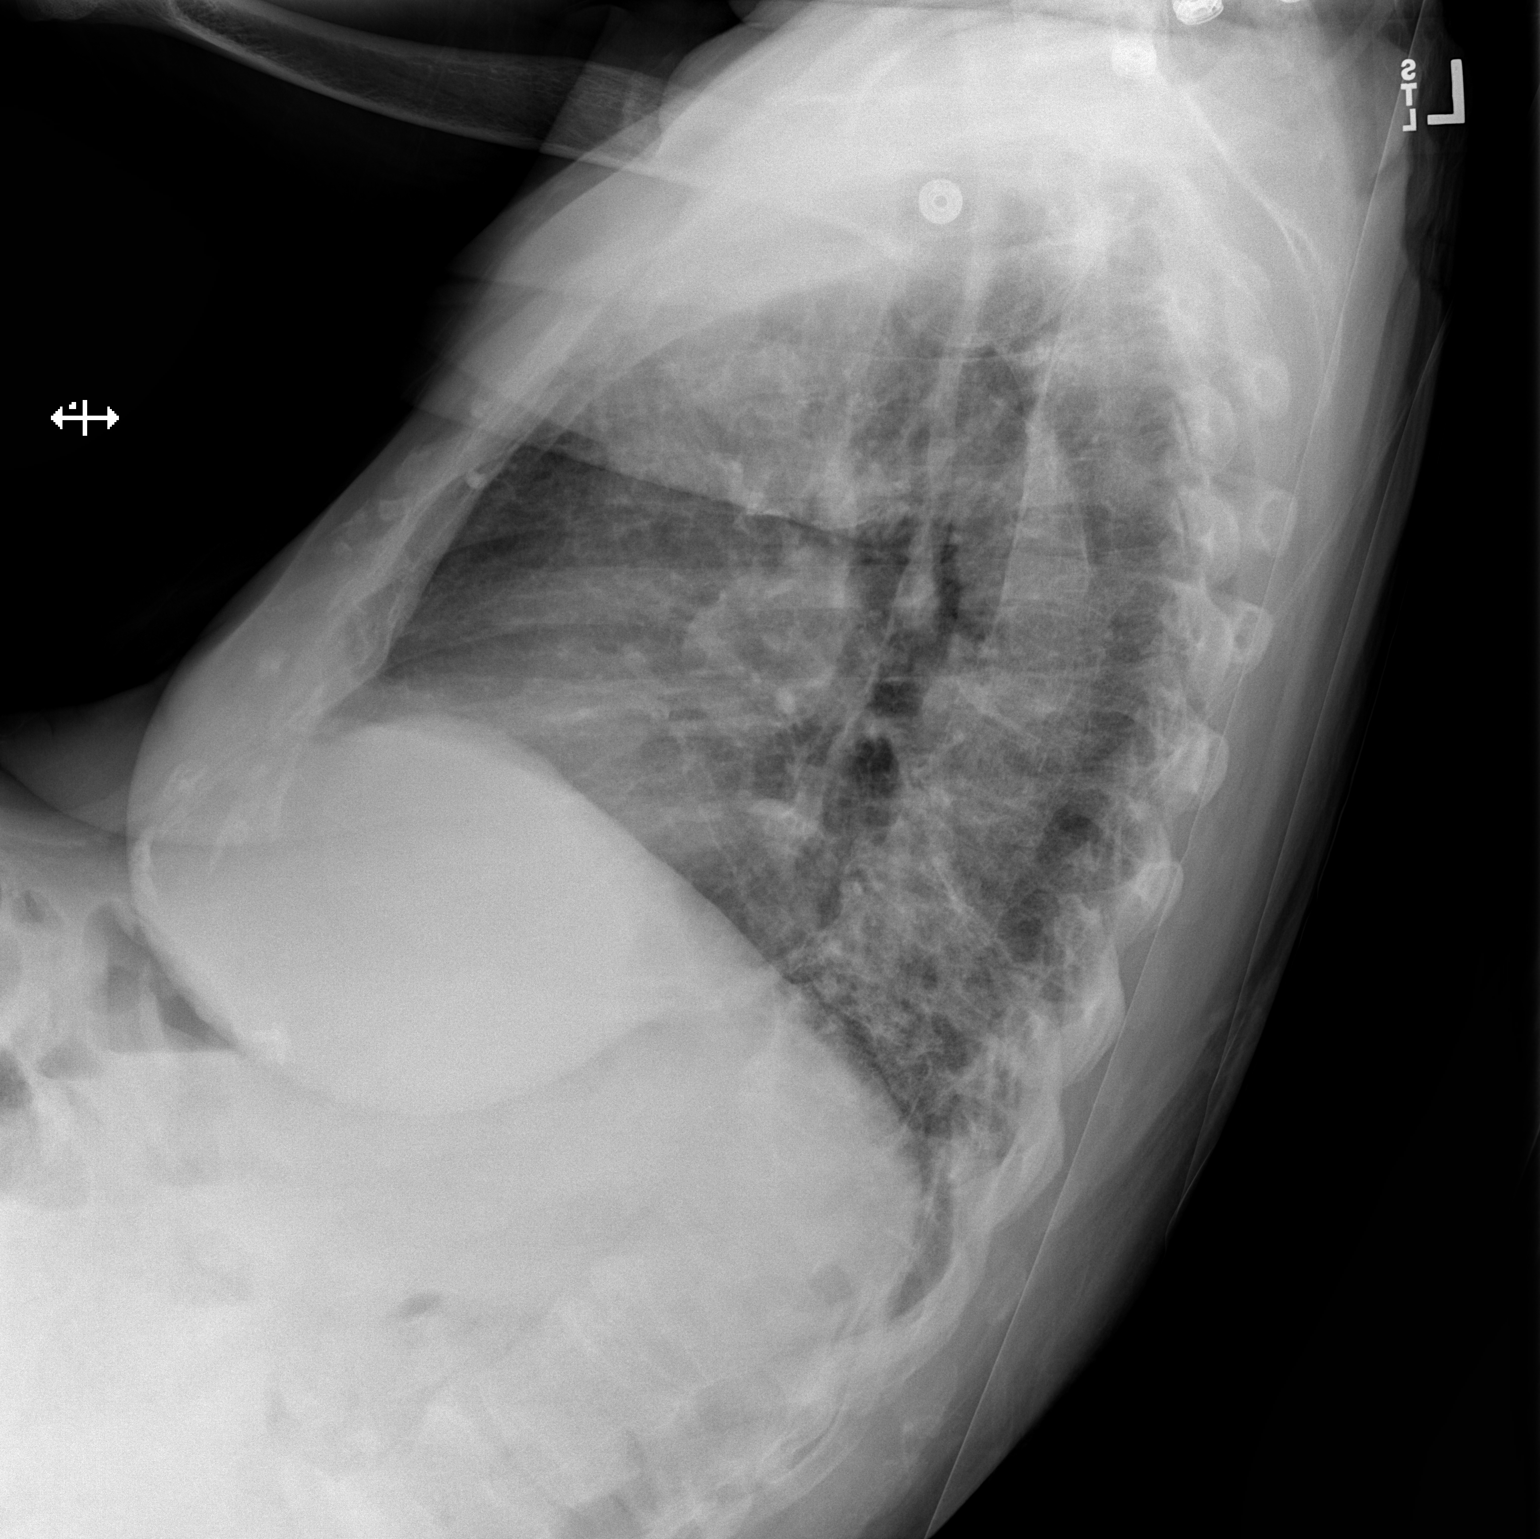

[x chest ap]
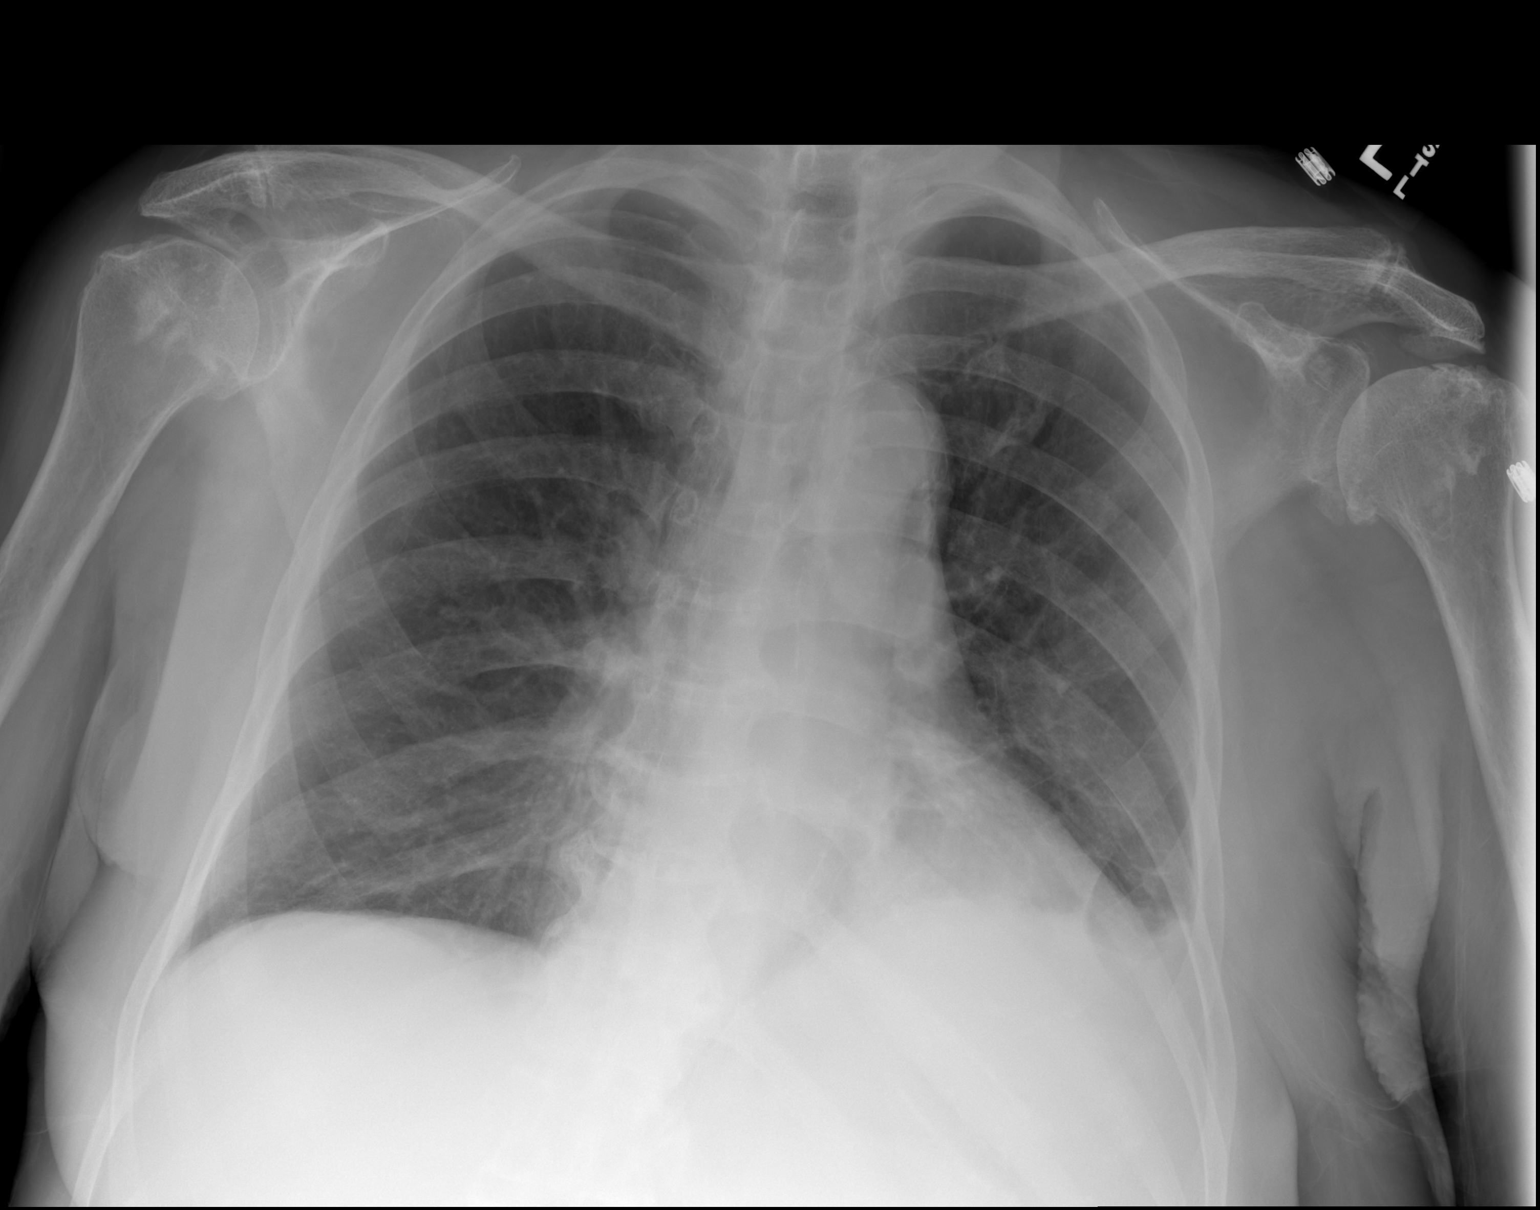

[2 of 2 positions shown; findings below may reference images not displayed]

FINDINGS: The heart size and mediastinal contours are within normal limits.
Aortic atherosclerosis at the arch without aneurysmal dilatation.
Bibasilar atelectasis is noted. No overt pulmonary edema.
Osteoarthritis of both glenohumeral and AC joints with small ossific
loose body along the axillary pouch on the left. No acute osseous
abnormality.
IMPRESSION: 1. Left basilar atelectasis.
2. Aortic atherosclerosis.
3. Bilateral AC and glenohumeral joint osteoarthritis with small
ossific loose body in the left axillary pouch.

## 2018-12-26 IMAGING — CT CT HEAD W/O CM
3 of 4 series · 15 of 47 positions shown, 18 images · non-contrast
Comparison: October 19, 2016

CLINICAL DATA: Lethargy and headache. Left-sided facial droop,
transient

EXAM:
CT HEAD WITHOUT CONTRAST
TECHNIQUE: Contiguous axial images were obtained from the base of the skull
through the vertex without intravenous contrast.

[Series 2: head w/o · axial · non-contrast · 0.46mm/px · z∈[+1221,+1341]mm · 9 of 30 slices shown, 12 images]
[im 3/30  brain]
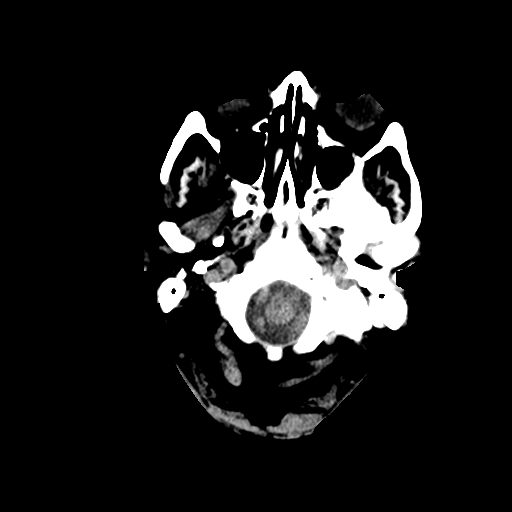
[im 3/30  bone]
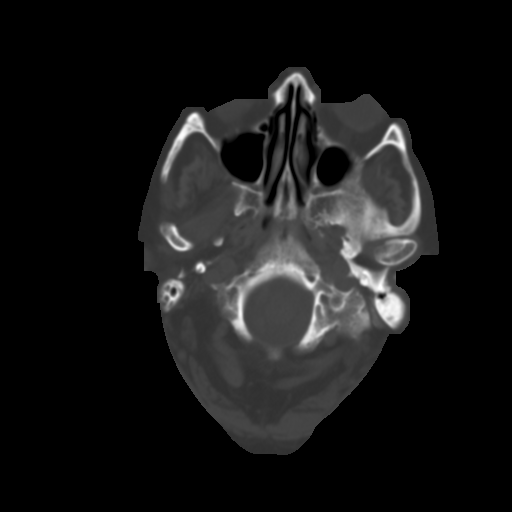
[im 7/30  brain]
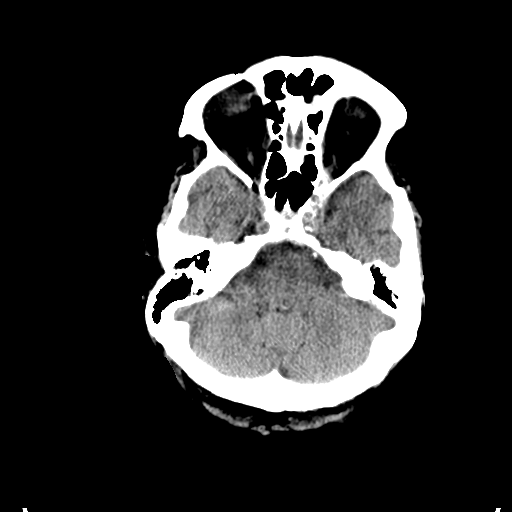
[im 9/30  brain]
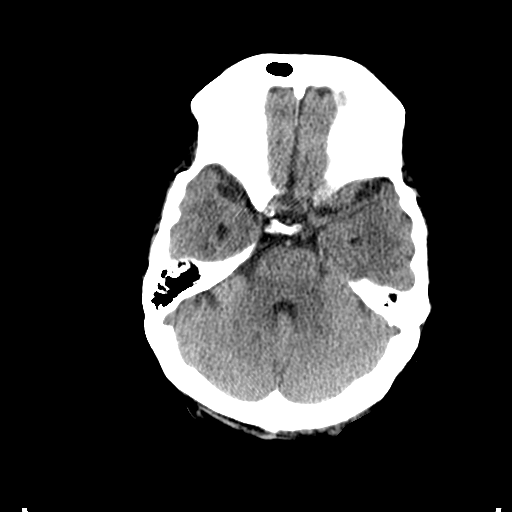
[im 13/30  brain]
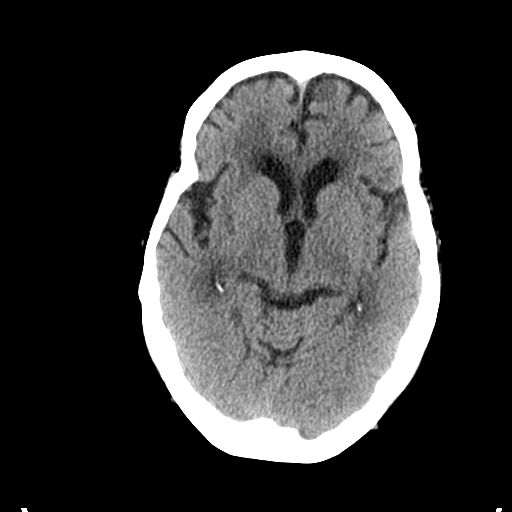
[im 15/30  brain]
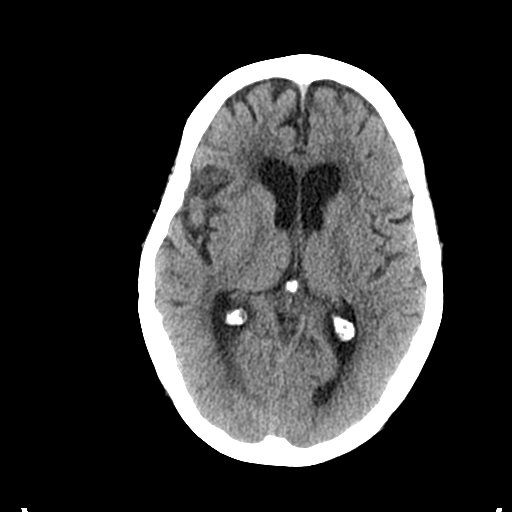
[im 15/30  bone]
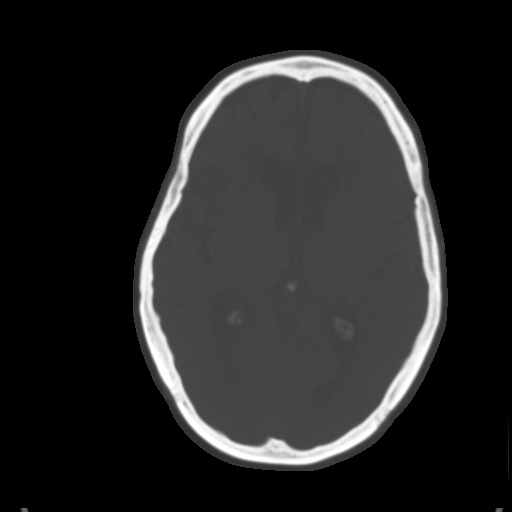
[im 17/30  brain]
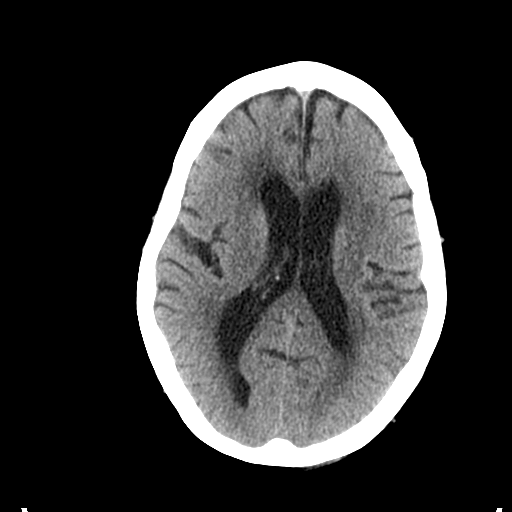
[im 21/30  brain]
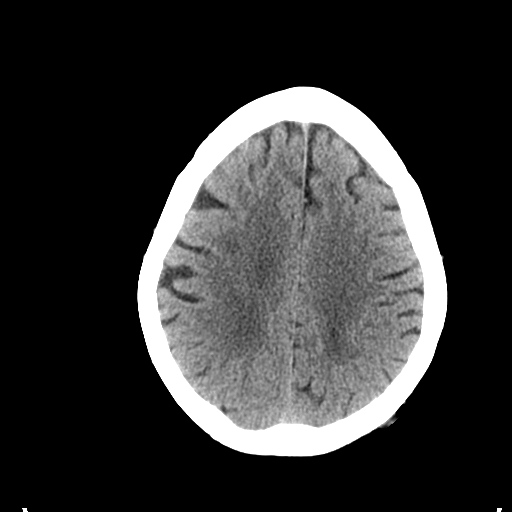
[im 23/30  brain]
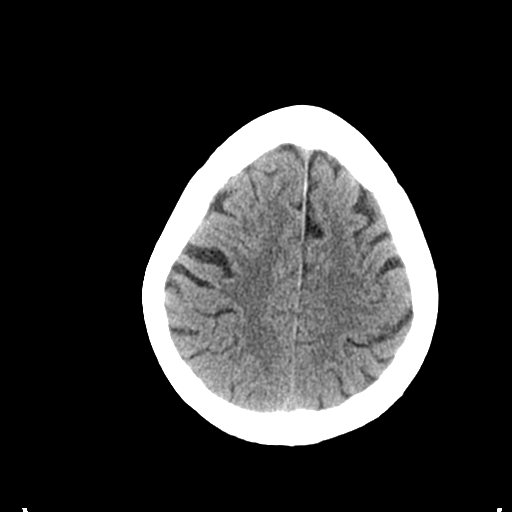
[im 27/30  brain]
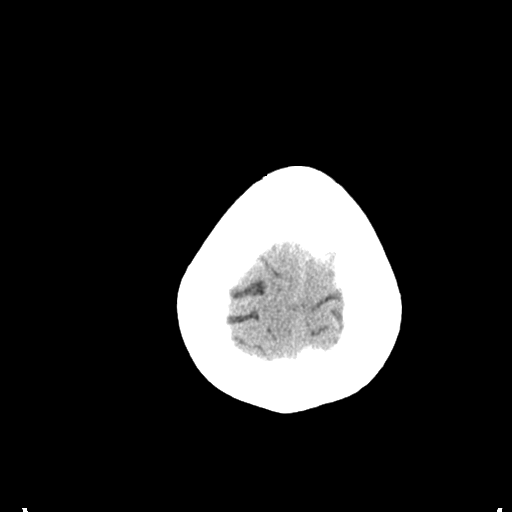
[im 27/30  bone]
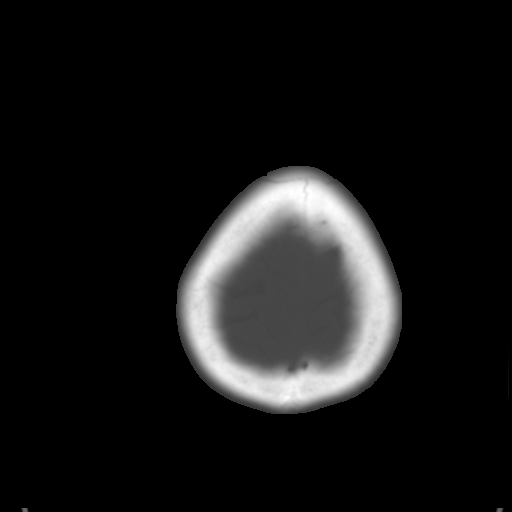

[Series 4: coronal · coronal · 0.36mm/px · 3 of 65 slices shown]
[im 22/65  brain]
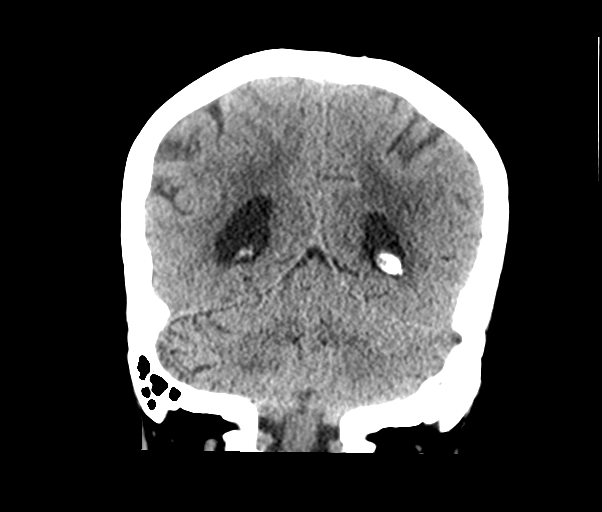
[im 29/65  brain]
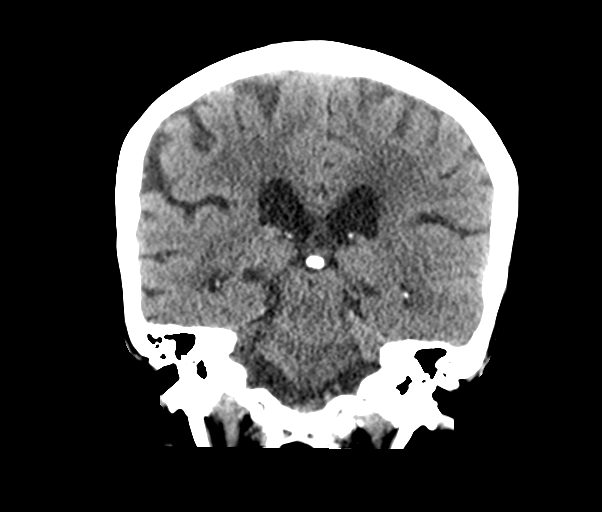
[im 36/65  brain]
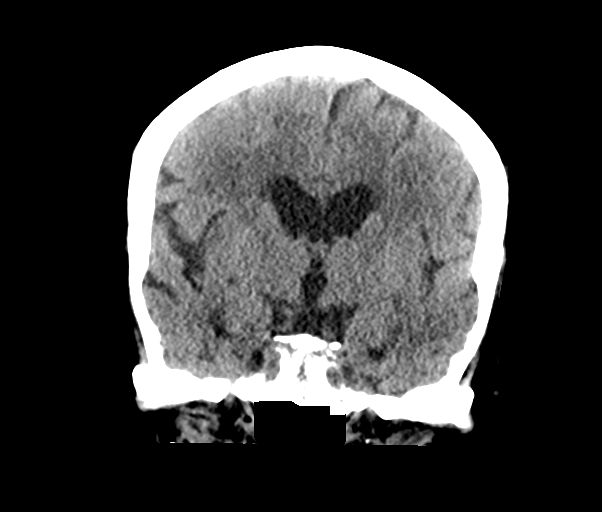

[Series 5: sagittal · sagittal · 0.39mm/px · 3 of 50 slices shown]
[im 17/50  brain]
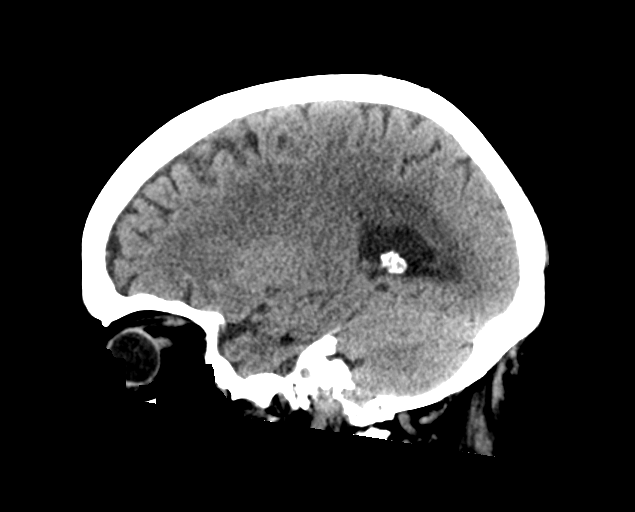
[im 25/50  brain]
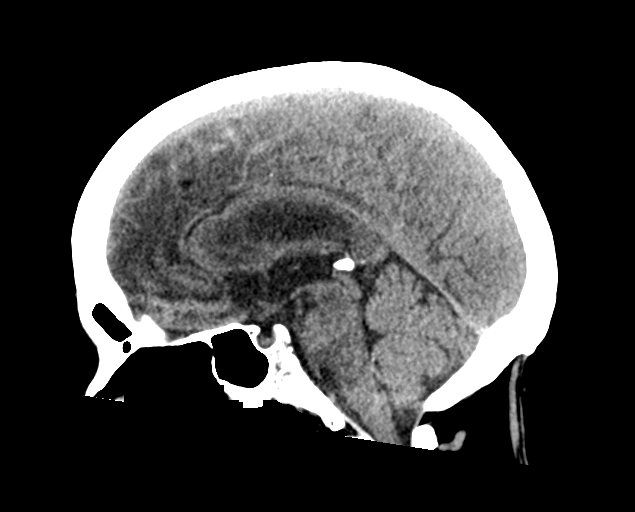
[im 33/50  brain]
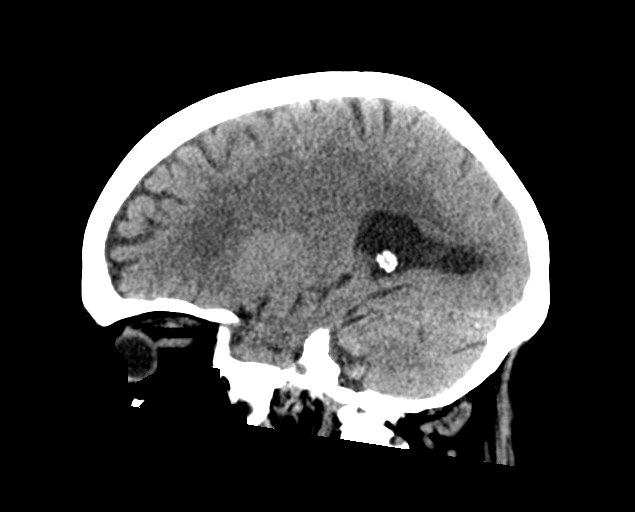

[15 of 47 positions shown; findings below may reference images not displayed]

FINDINGS: Brain: There is mild diffuse atrophy. There is no intracranial mass,
hemorrhage, extra-axial fluid collection, or midline shift. There is
patchy small vessel disease in the centra semiovale bilaterally,
stable. There is no new gray-white compartment lesion. No acute
infarct evident.

Vascular: There is no appreciable hyperdense vessel. There is
calcification in each carotid siphon region.

Skull: Bony calvarium appears intact.

Sinuses/Orbits: Visualized paranasal sinuses are clear. Visualized
orbits appear symmetric bilaterally.

Other: There is opacification in several inferior mastoid air cells
on the left, stable. Mastoids elsewhere are clear.
IMPRESSION: Atrophy with stable patchy supratentorial small vessel disease. No
intracranial mass, hemorrhage, or extra-axial fluid collection. No
acute infarct evident.

There are foci of arterial vascular calcification. There is mild
chronic inferior mastoid air cell disease on the left, stable.

## 2018-12-26 IMAGING — MR MR HEAD W/O CM
9 of 10 series · 38 of 48 positions shown · non-contrast
Comparison: Head CT 01/25/2017

CLINICAL DATA: Headache and weakness.  Left-sided facial droop.

EXAM:
MRI HEAD WITHOUT CONTRAST
TECHNIQUE: Multiplanar, multiecho pulse sequences of the brain and surrounding
structures were obtained without intravenous contrast.

[Series 3: DWI · axial · 3.0mm · 1.09mm/px · z∈[-64,+74]mm · 9 of 96 slices shown (1 of 4)]
[im 1/96]
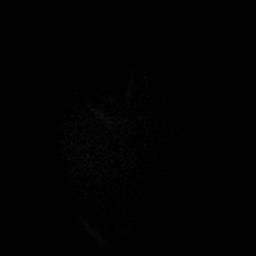
[im 18/96]
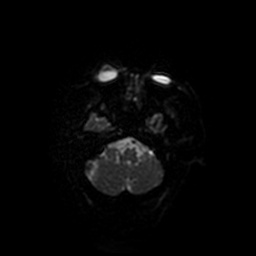
[im 26/96]
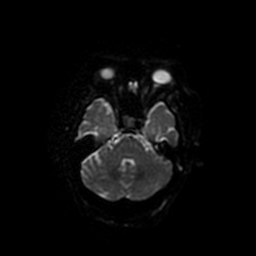
[im 44/96]
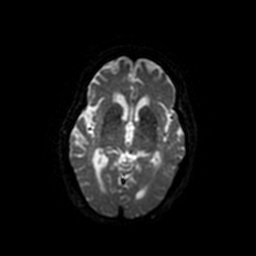
[im 52/96]
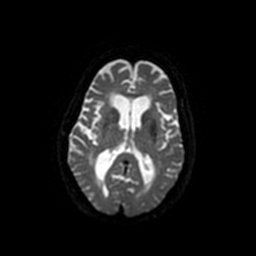
[im 70/96]
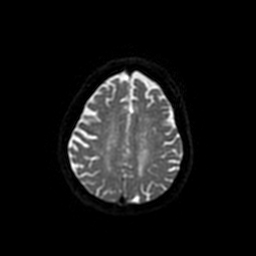
[im 78/96]
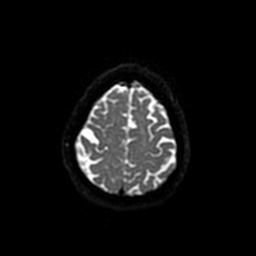
[im 87/96]
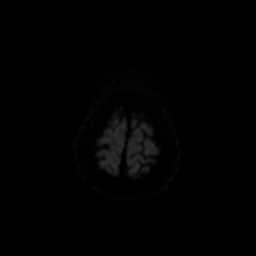
[im 96/96]
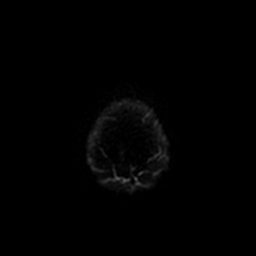

[Series 4: DWI · coronal · 5.0mm · 1.09mm/px · 8 of 72 slices shown (2 of 4)]
[im 1/72]
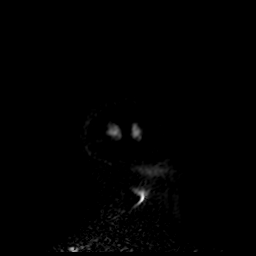
[im 11/72]
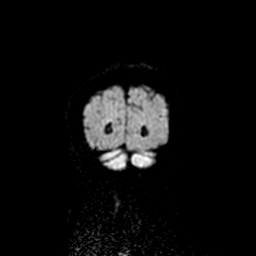
[im 21/72]
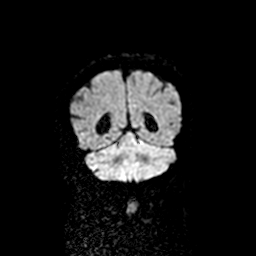
[im 31/72]
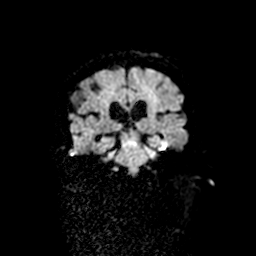
[im 41/72]
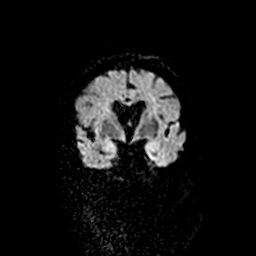
[im 51/72]
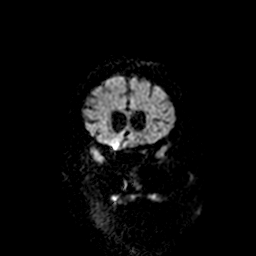
[im 61/72]
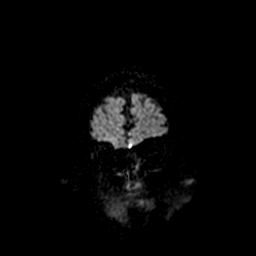
[im 72/72]
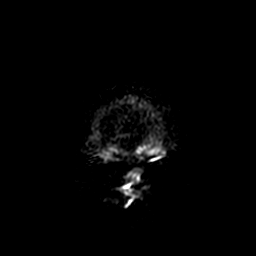

[Series 5: T1 · sagittal · 5.0mm · 0.47mm/px · 3 of 25 slices shown]
[im 1/25]
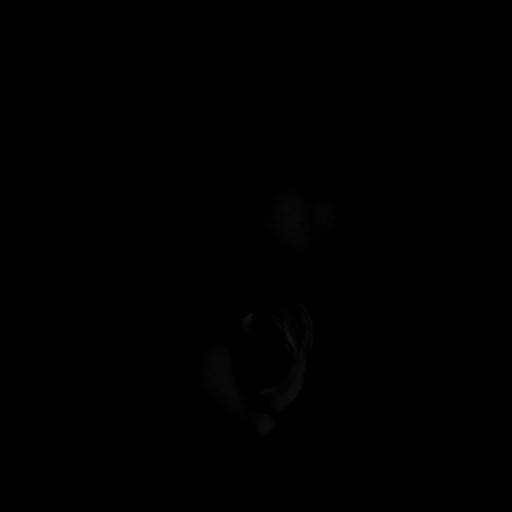
[im 13/25]
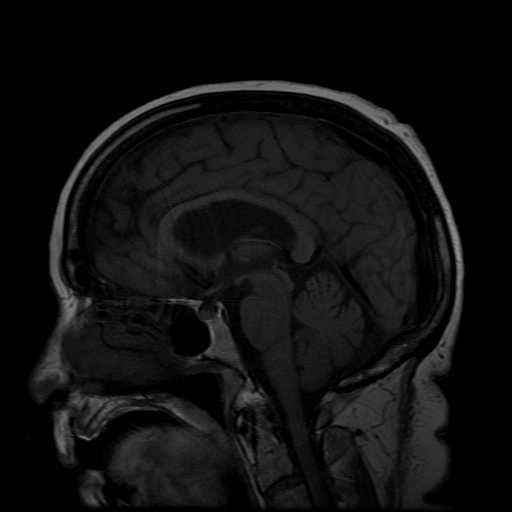
[im 25/25]
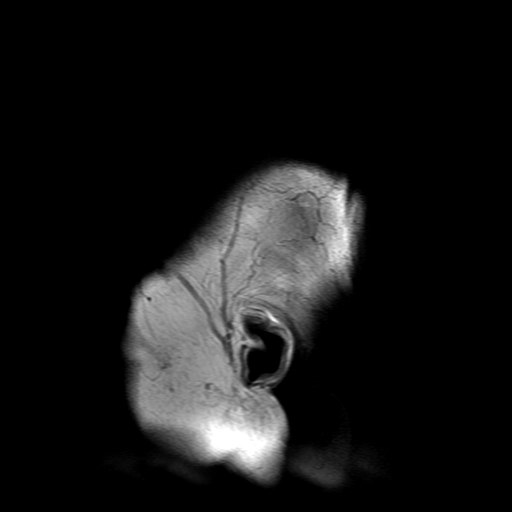

[Series 6: T2 · axial · 5.0mm · 0.43mm/px · z∈[-69,+68]mm · 2 of 21 slices shown (1 of 2)]
[im 1/21]
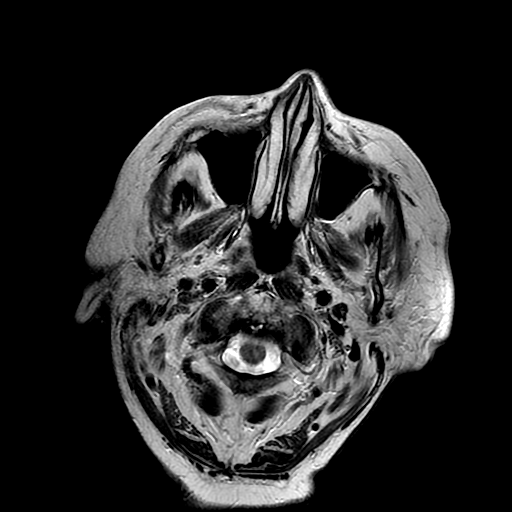
[im 21/21]
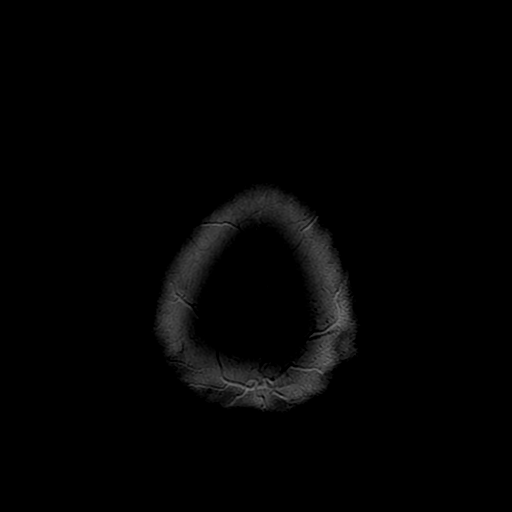

[Series 7: FLAIR · axial · 5.0mm · 0.43mm/px · z∈[-69,+68]mm · 2 of 21 slices shown]
[im 1/21]
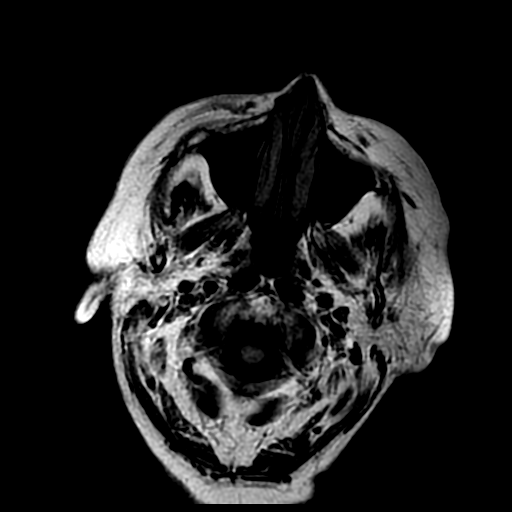
[im 21/21]
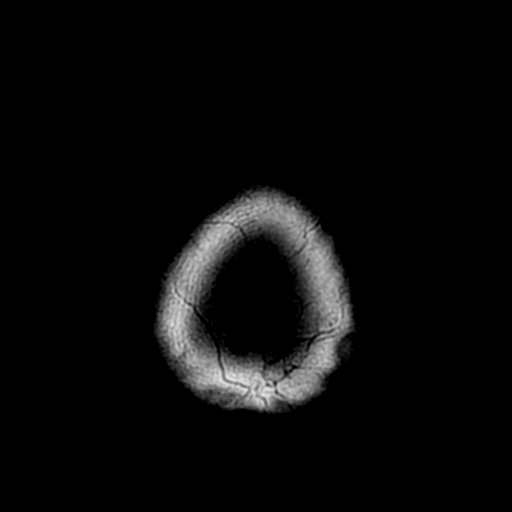

[Series 8: ax mpgr · axial · 5.0mm · 0.43mm/px · 1 of 21 slices shown]
[im 1/21]
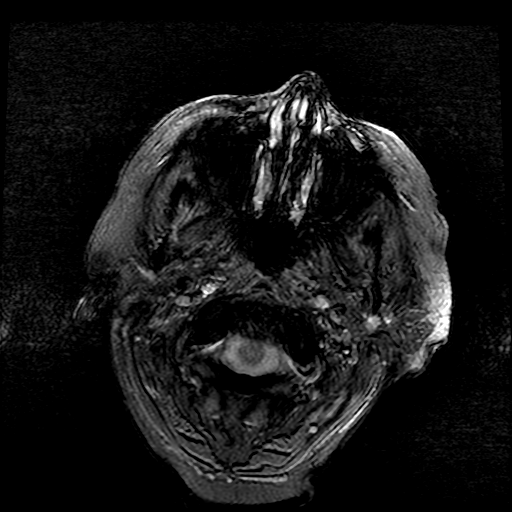

[Series 10: T2 · coronal · 5.0mm · 0.45mm/px · 3 of 28 slices shown (2 of 2)]
[im 1/28]
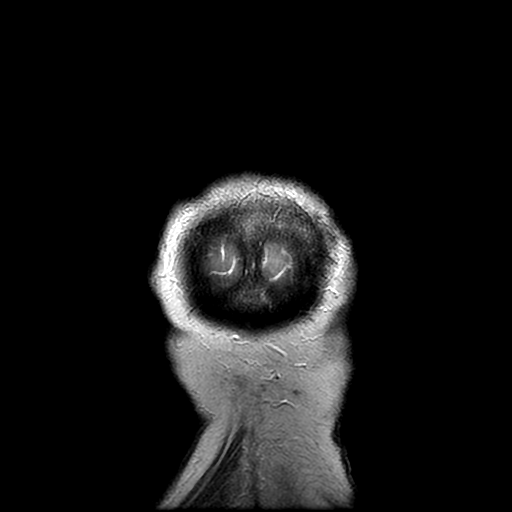
[im 14/28]
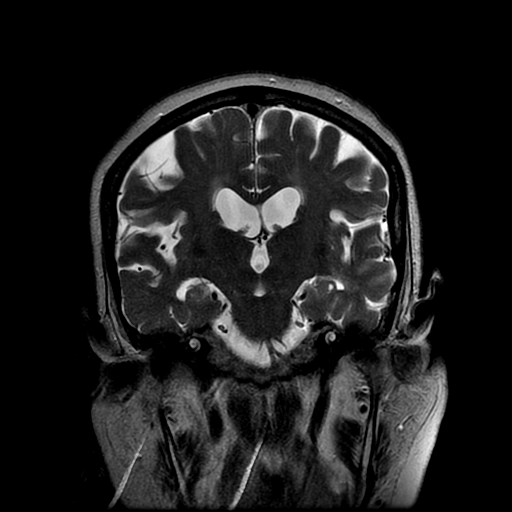
[im 28/28]
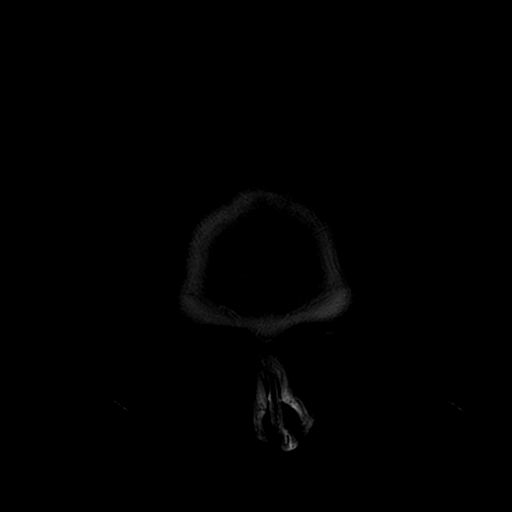

[Series 300: DWI · axial · 3.0mm · 1.09mm/px · z∈[-64,+74]mm · 6 of 48 slices shown (3 of 4)]
[im 1/48]
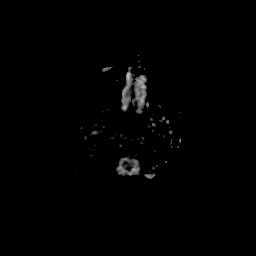
[im 10/48]
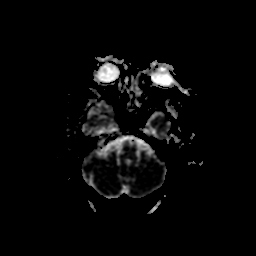
[im 19/48]
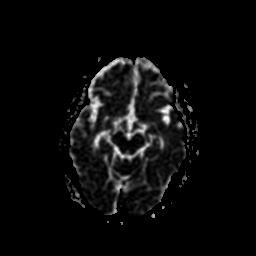
[im 29/48]
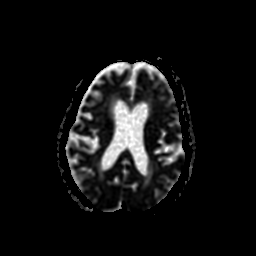
[im 38/48]
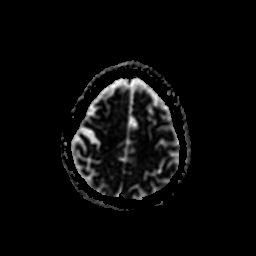
[im 48/48]
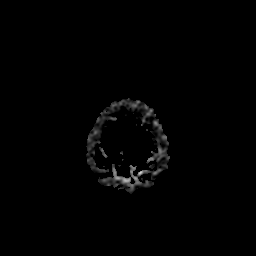

[Series 400: DWI · coronal · 5.0mm · 1.09mm/px · 4 of 36 slices shown (4 of 4)]
[im 1/36]
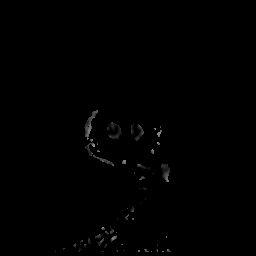
[im 12/36]
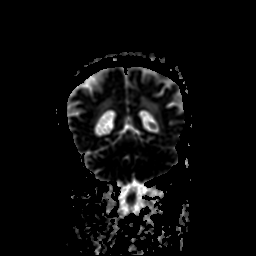
[im 24/36]
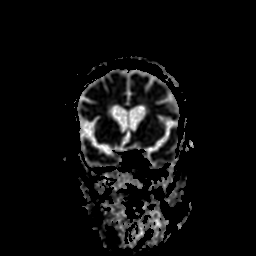
[im 36/36]
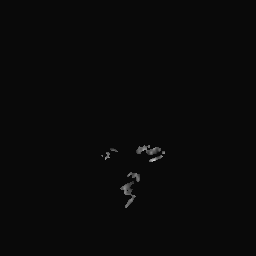

[38 of 48 positions shown; findings below may reference images not displayed]

FINDINGS: Brain: The midline structures are normal. There is no focal
diffusion restriction to indicate acute infarct. There is confluent
hyperintense T2-weighted signal within the periventricular white
matter, most often seen in the setting of chronic microvascular
ischemia. No intraparenchymal hematoma or chronic microhemorrhage.
No lobar predominant atrophy. The dura is normal and there is no
extra-axial collection.

Vascular: Major intracranial arterial and venous sinus flow voids
are preserved.

Skull and upper cervical spine: The visualized skull base,
calvarium, upper cervical spine and extracranial soft tissues are
normal.

Sinuses/Orbits: No fluid levels or advanced mucosal thickening. No
mastoid or middle ear effusion. Normal orbits.
IMPRESSION: 1. No acute abnormality.
2. Advanced chronic microvascular ischemia.

## 2018-12-26 IMAGING — CR DG CHEST 2V
2 series · 2 of 2 positions shown · non-contrast
Comparison: 01/16/2017

CLINICAL DATA: Headache.  Lethargy.

EXAM:
CHEST  2 VIEW

[w chest lat]
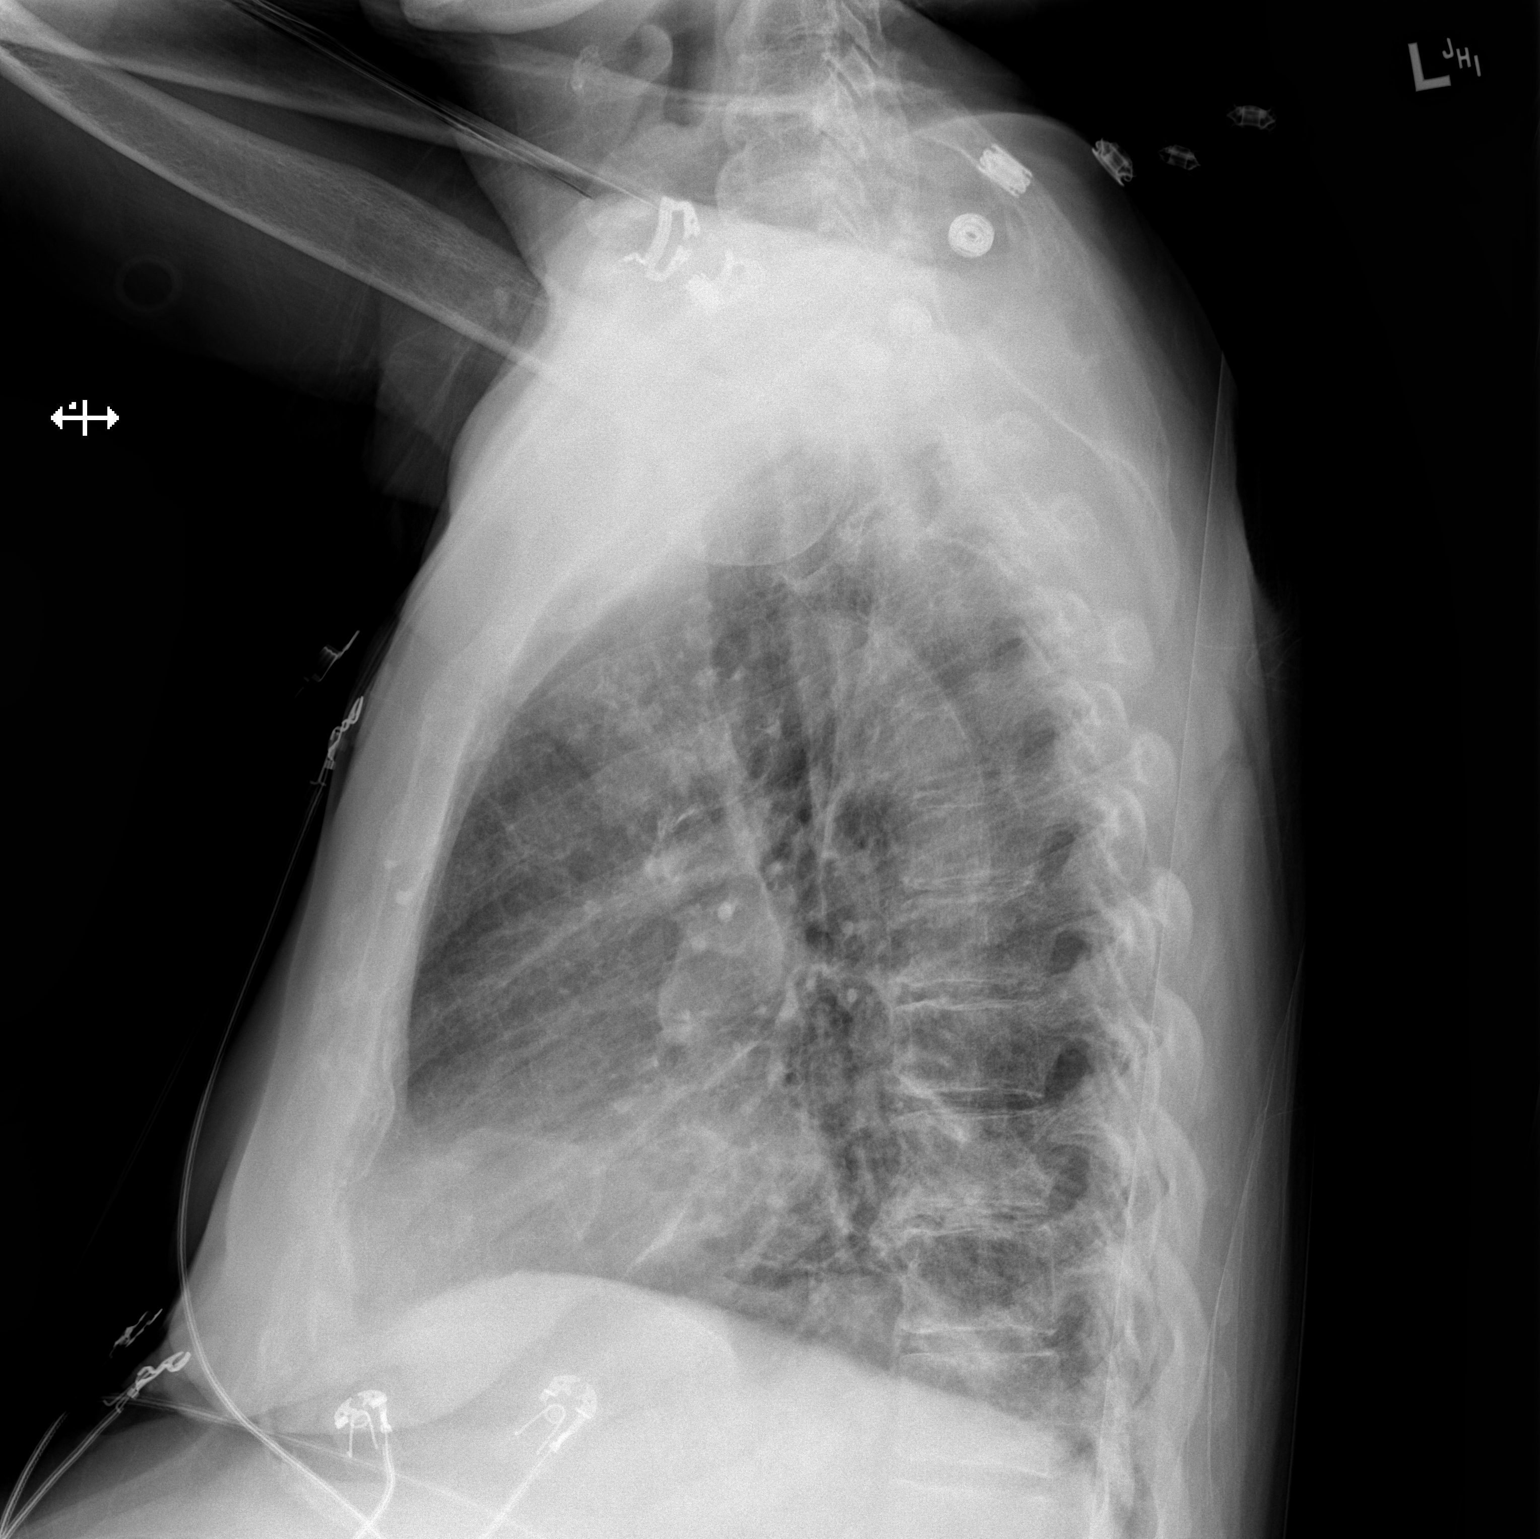

[x chest ap]
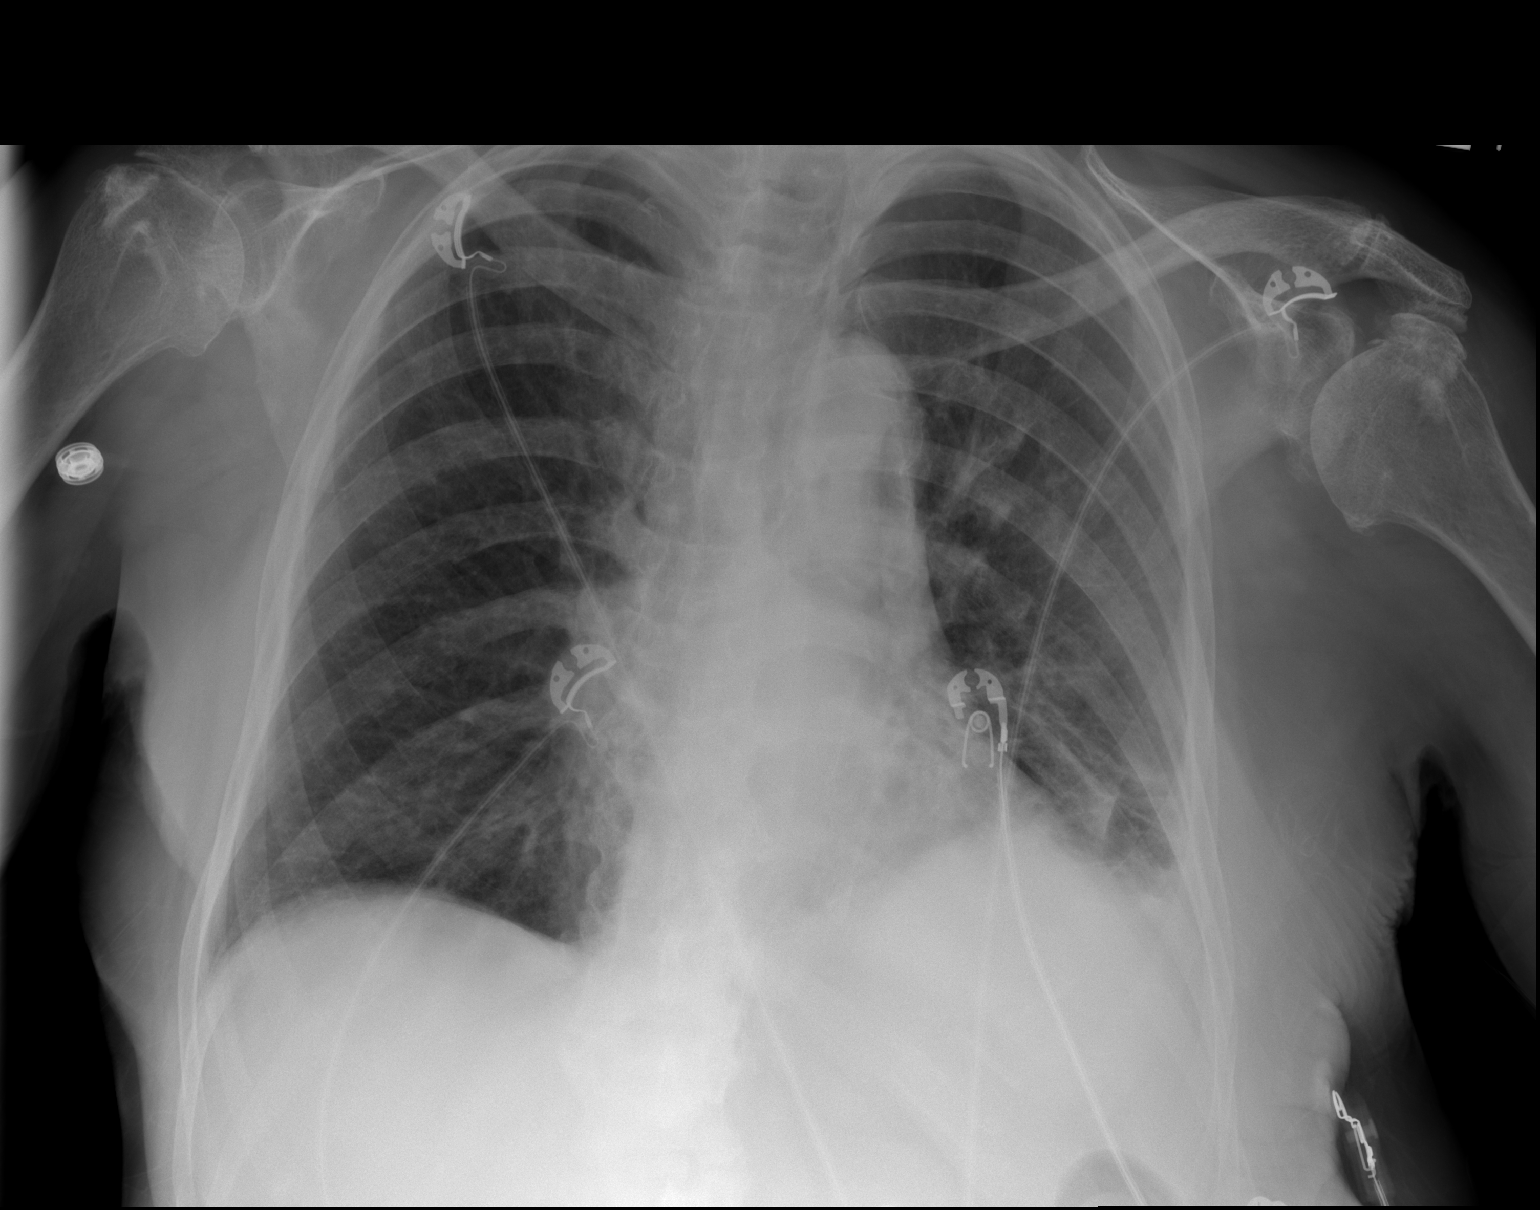

[2 of 2 positions shown; findings below may reference images not displayed]

FINDINGS: The heart size is normal. Persistent left lung base opacity
compatible with atelectasis and/or pneumonia. Right lung is clear.
No pleural effusions.
IMPRESSION: 1. Persistent left lung base opacity which may represent atelectasis
or pneumonia.

## 2018-12-27 IMAGING — CT CT CHEST W/O CM
2 of 3 series · 15 of 36 positions shown, 18 images · non-contrast
Comparison: 01/25/2017 radiograph, CT chest 12/08/2016, 10/08/2016

CLINICAL DATA: Follow-up nodules on prior CT, pneumonia, pancreas
mass

EXAM:
CT CHEST WITHOUT CONTRAST
TECHNIQUE: Multidetector CT imaging of the chest was performed following the
standard protocol without IV contrast.

[Series 2: thorax · axial · 0.61mm/px · z∈[-499,-247]mm · 12 of 148 slices shown, 15 images]
[im 11/148  mediastinal]
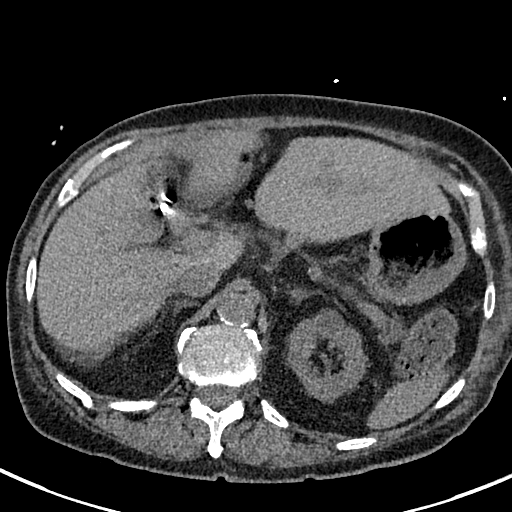
[im 11/148  lung]
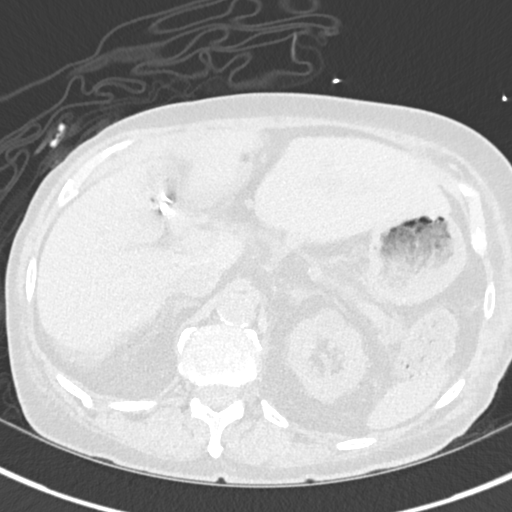
[im 22/148  lung]
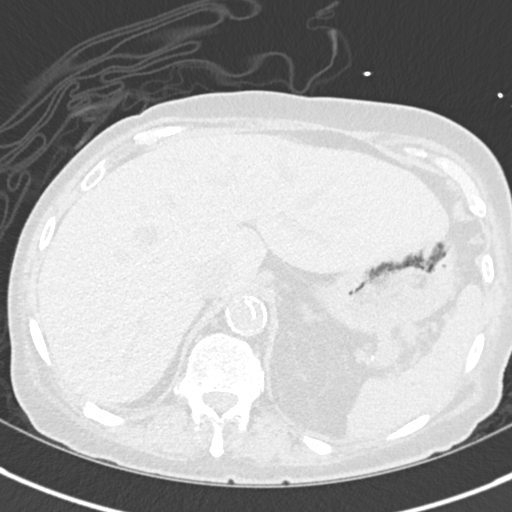
[im 33/148  lung]
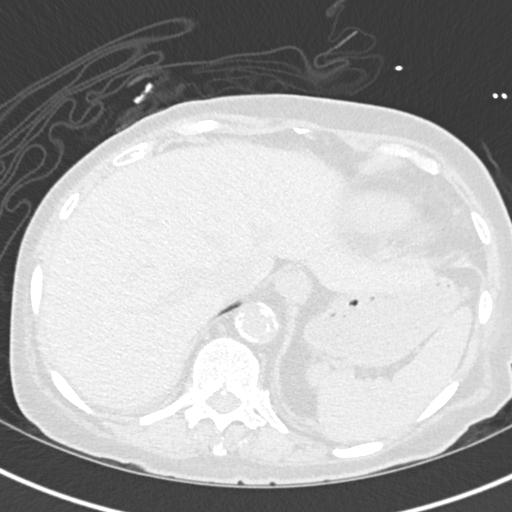
[im 44/148  lung]
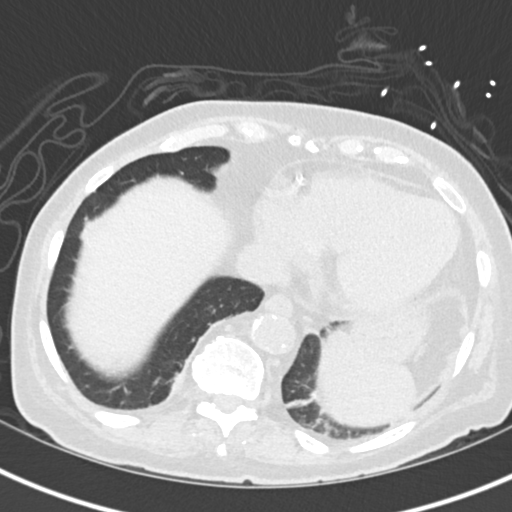
[im 55/148  mediastinal]
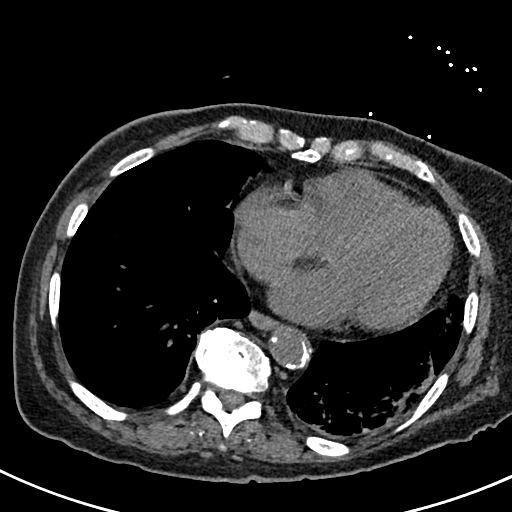
[im 55/148  lung]
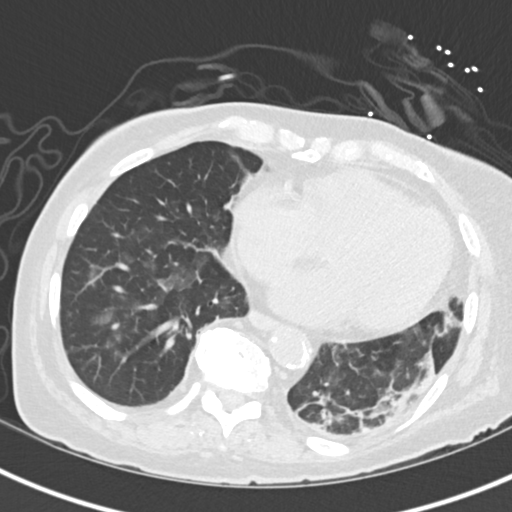
[im 66/148  lung]
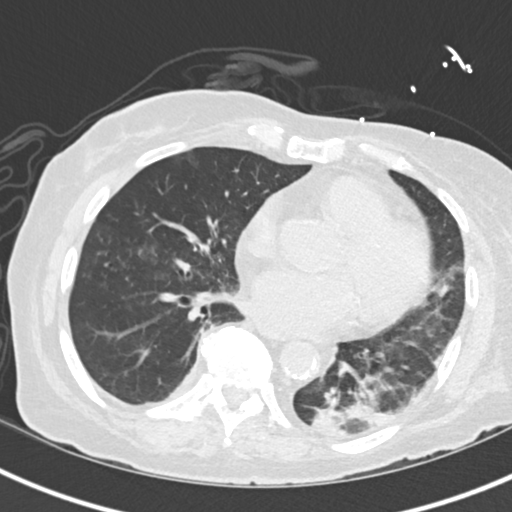
[im 82/148  lung]
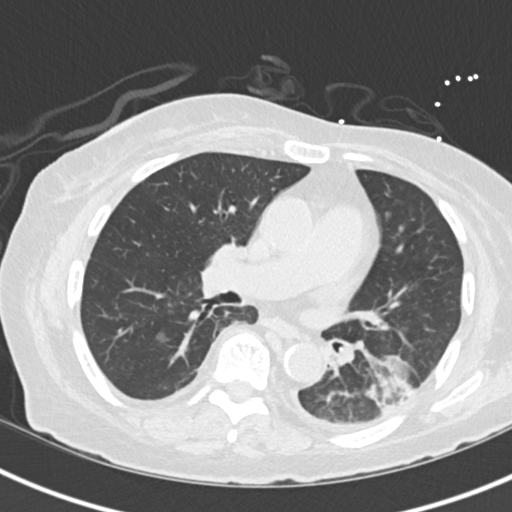
[im 93/148  lung]
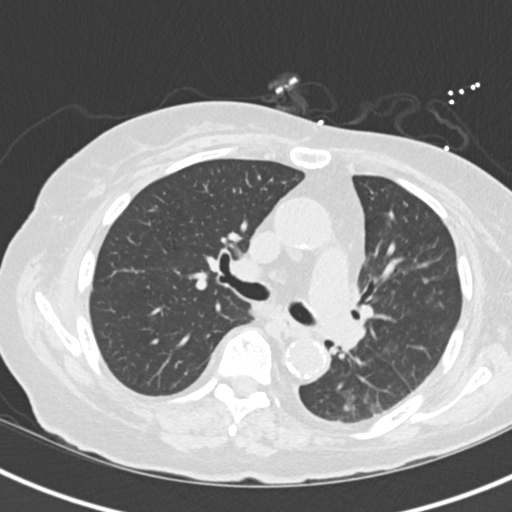
[im 104/148  mediastinal]
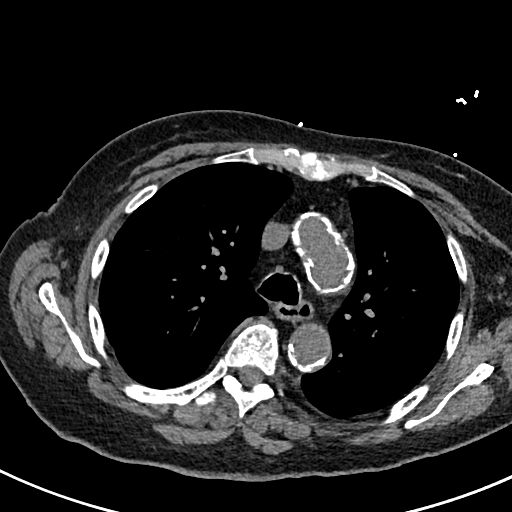
[im 104/148  lung]
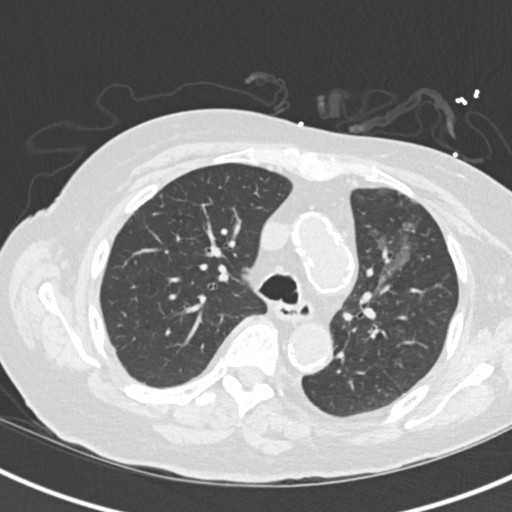
[im 115/148  lung]
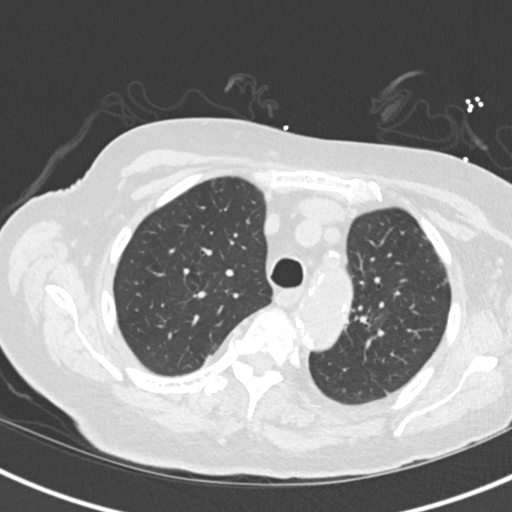
[im 126/148  lung]
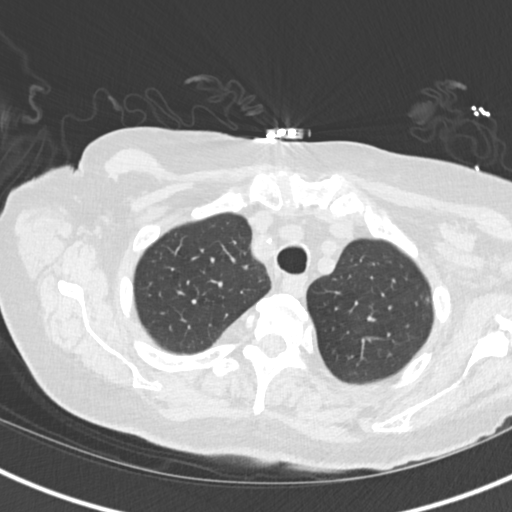
[im 137/148  lung]
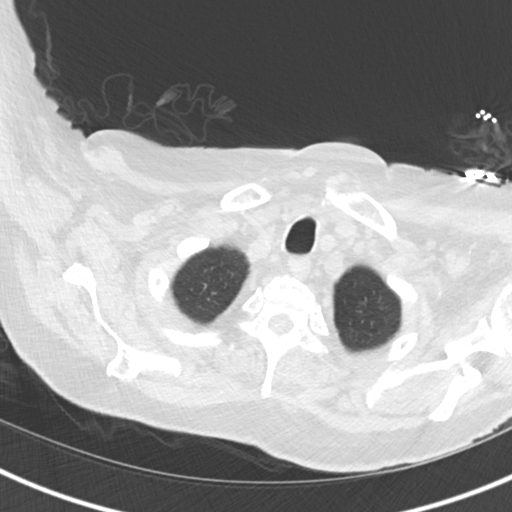

[Series 6: coronal · coronal · 0.59mm/px · 3 of 126 slices shown]
[im 26/126  lung]
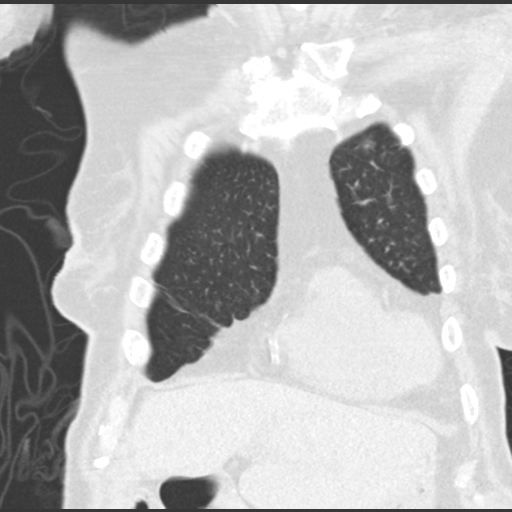
[im 51/126  lung]
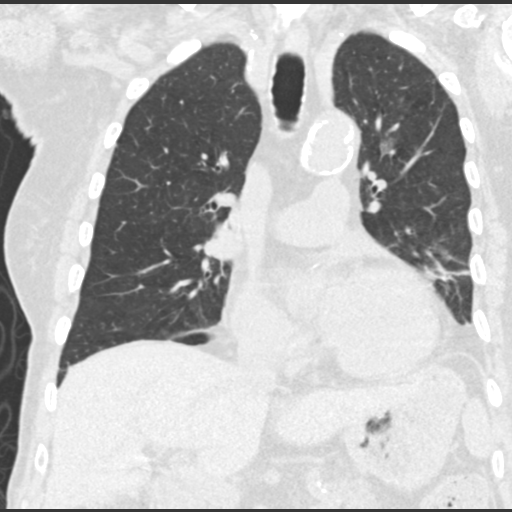
[im 76/126  lung]
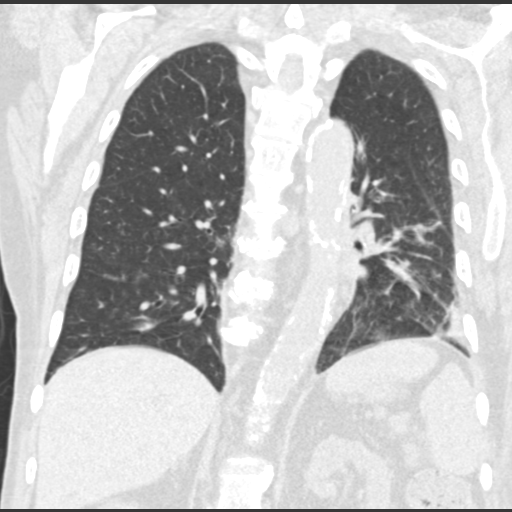

[15 of 36 positions shown; findings below may reference images not displayed]

FINDINGS: Cardiovascular: Limited evaluation without intravenous contrast.
Heavily calcified aorta with tortuous contour of the aortic arch,
unchanged. Calcifications within the great vessels. Coronary artery
calcifications. Normal heart size. No pericardial effusion.

Mediastinum/Nodes: No significantly enlarged mediastinal lymph
nodes. Limited evaluation for hilar nodes without contrast. Stable
appearance of thyroid gland and trachea. Esophagus within normal
limits.

Lungs/Pleura: Resolved sub pleural nodularity on the right since
prior study. The tiny pulmonary nodules in the left lower lobe are
not discretely visualized, however there is progressive patchy
consolidation in the left lower lobe since the prior study.
Development of multifocal ground-glass densities within the right
lower lobe, and left upper lobe. No large pleural effusion. Negative
for pneumothorax.

Upper Abdomen: Calcified thrombus or dissection flap at the aortic
hiatus unchanged. Cyst within the upper pole of left kidney
anteriorly. Patient's pancreas mass is not imaged. Surgical clips in
the gallbladder fossa.

Musculoskeletal: Degenerative changes. No acute or suspicious bone
lesion.
IMPRESSION: 1. Increased consolidation in the left lower lobe with multifocal
ground-glass density in the left upper and right lower lobes,
suspicious for multifocal pneumonia. Previously described right lung
nodularity and left lower lobe nodules have decreased.

Aortic Atherosclerosis ([id number]-7MC.C).

## 2018-12-28 IMAGING — MR MR MRCP
6 of 12 series · 21 of 48 positions shown · non-contrast
Comparison: CT scans 01/16/2017 and 09/25/2015

CLINICAL DATA: Evaluate pancreatic cyst seen on prior CT abdomen.

EXAM:
MRI ABDOMEN WITHOUT CONTRAST  (INCLUDING MRCP)
TECHNIQUE: Multiplanar multisequence MR imaging of the abdomen was performed.
Heavily T2-weighted images of the biliary and pancreatic ducts were
obtained, and three-dimensional MRCP images were rendered by post
processing.

[Series 3: T2 fat-sat · axial · 5.0mm · 0.78mm/px · z∈[-176,+74]mm · 4 of 51 slices shown]
[im 1/51]
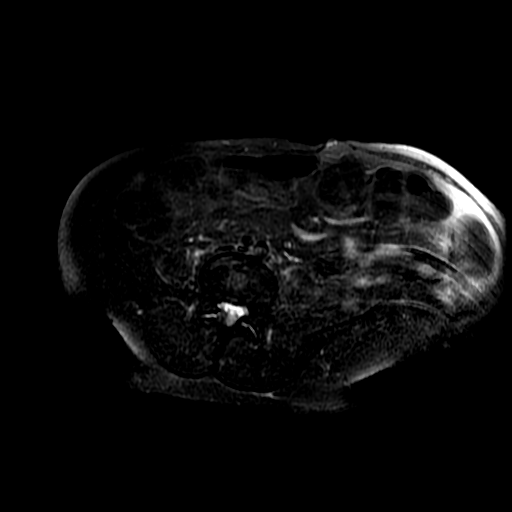
[im 17/51]
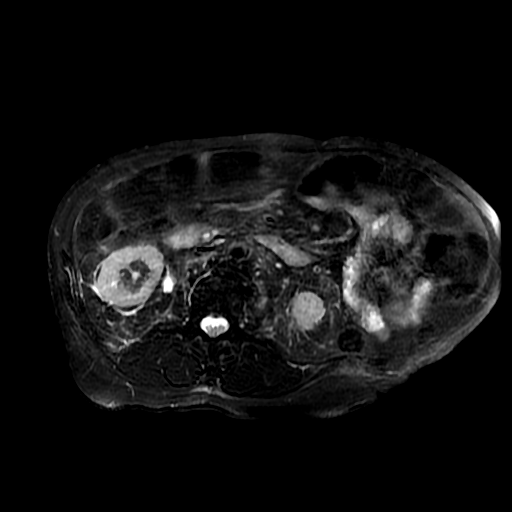
[im 34/51]
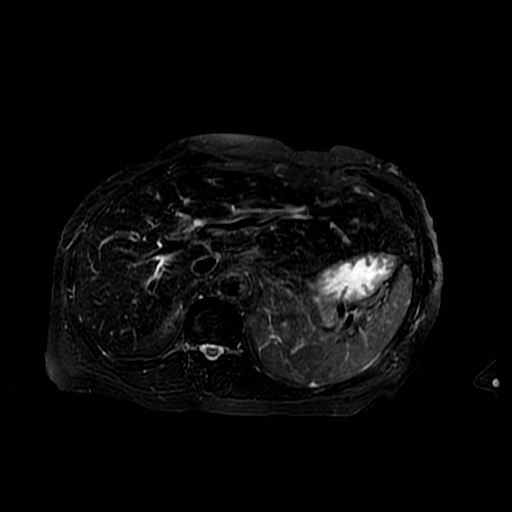
[im 51/51]
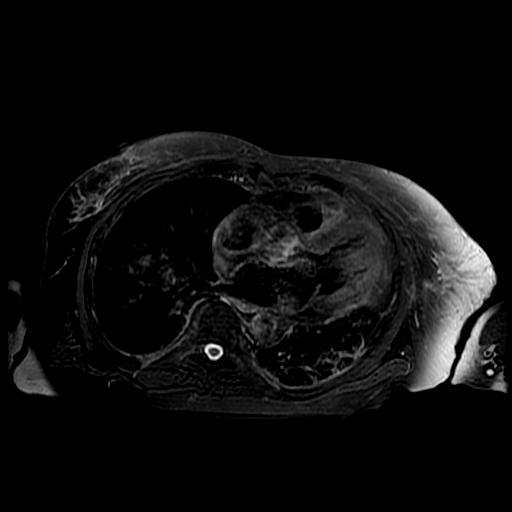

[Series 5: MRCP · coronal · 2.0mm · 0.70mm/px · 3 of 43 slices shown]
[im 1/43]
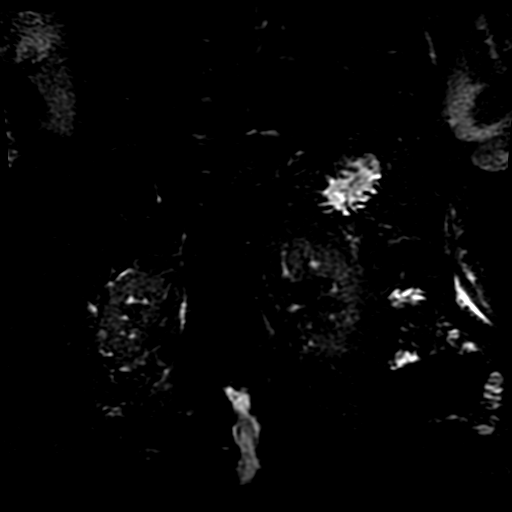
[im 22/43]
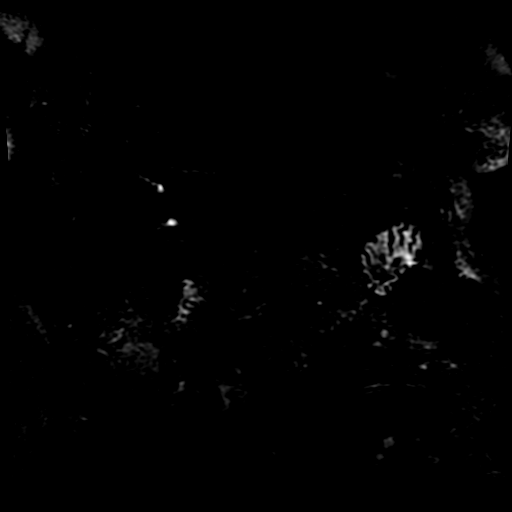
[im 43/43]
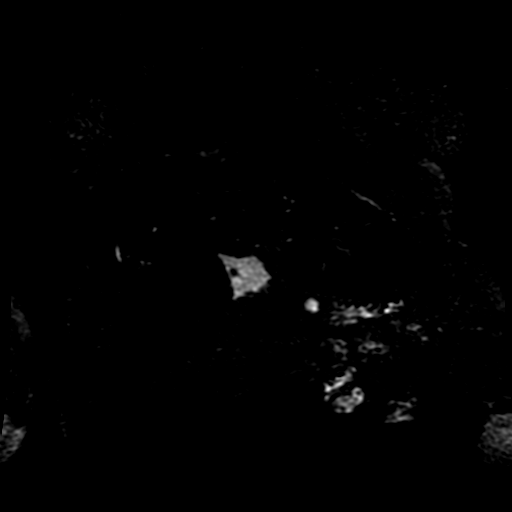

[Series 6: DWI b500 · axial · 6.0mm · 1.48mm/px · z∈[-151,+106]mm · 5 of 68 slices shown]
[im 1/68]
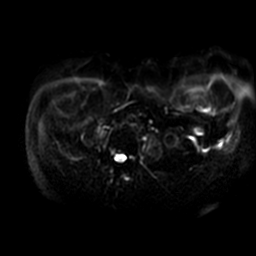
[im 17/68]
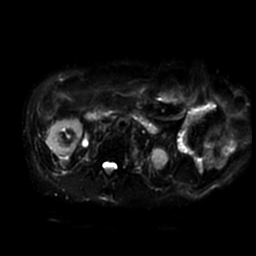
[im 34/68]
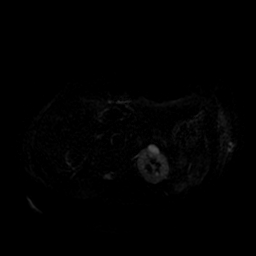
[im 51/68]
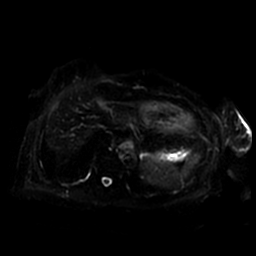
[im 68/68]
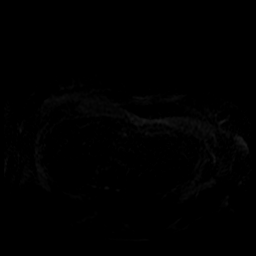

[Series 7: bSSFP fat-sat · coronal · 5.0mm · 0.74mm/px · 3 of 41 slices shown]
[im 1/41]
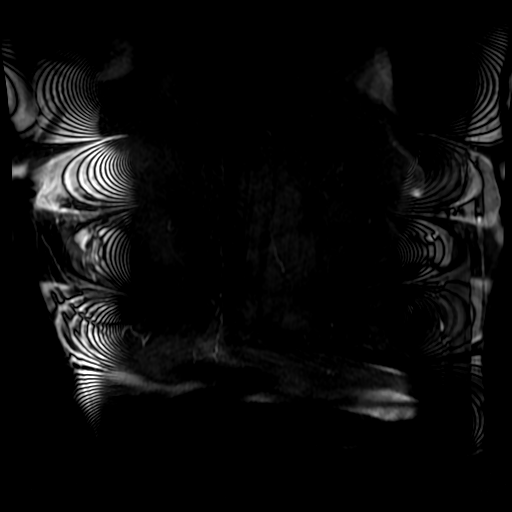
[im 21/41]
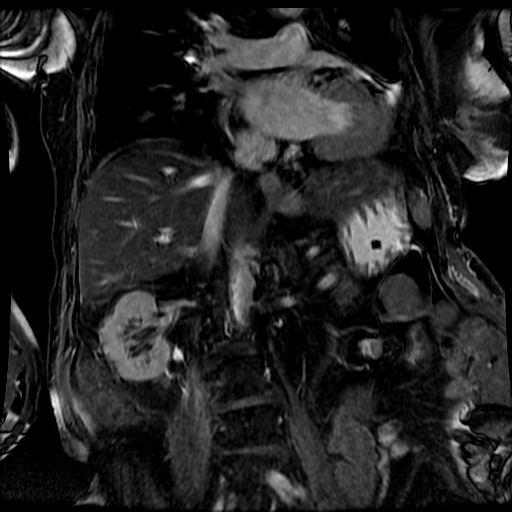
[im 41/41]
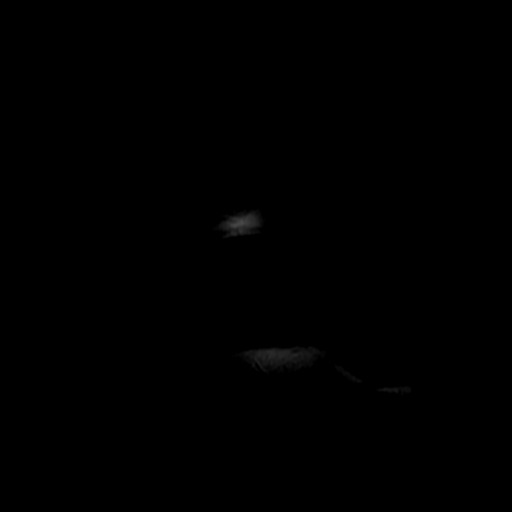

[Series 8: T2 · axial · 5.0mm · 0.78mm/px · z∈[-161,+84]mm · 3 of 50 slices shown]
[im 1/50]
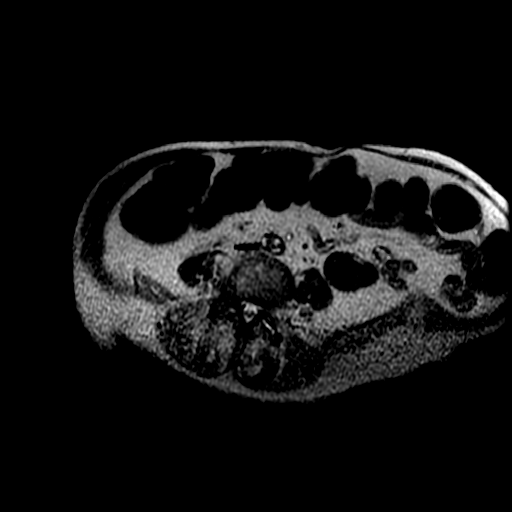
[im 25/50]
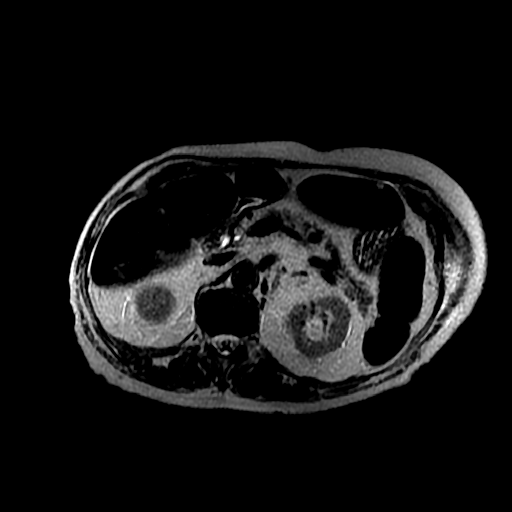
[im 50/50]
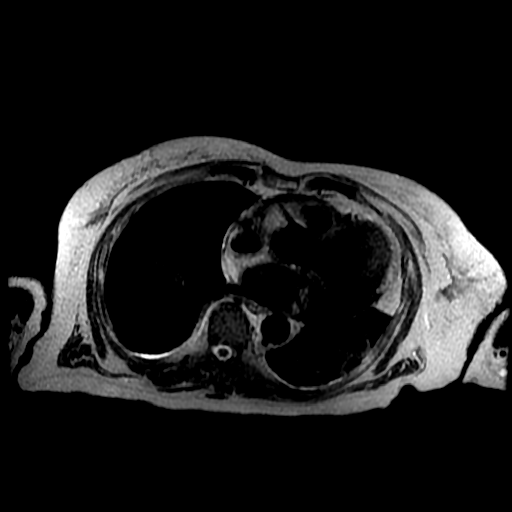

[Series 9: ax dualecho · axial · 5.0mm · 0.78mm/px · z∈[-161,-81]mm · 3 of 100 slices shown]
[im 1/100]
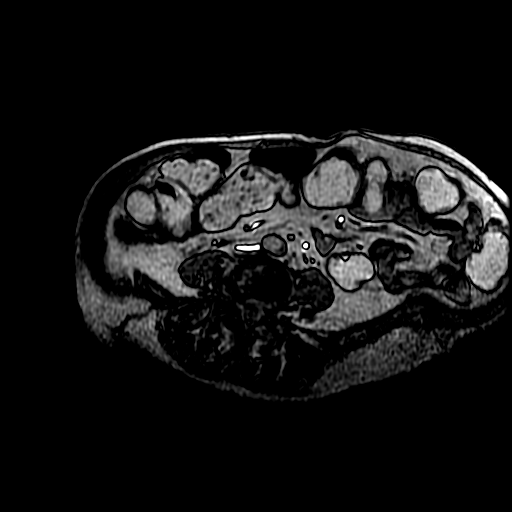
[im 17/100]
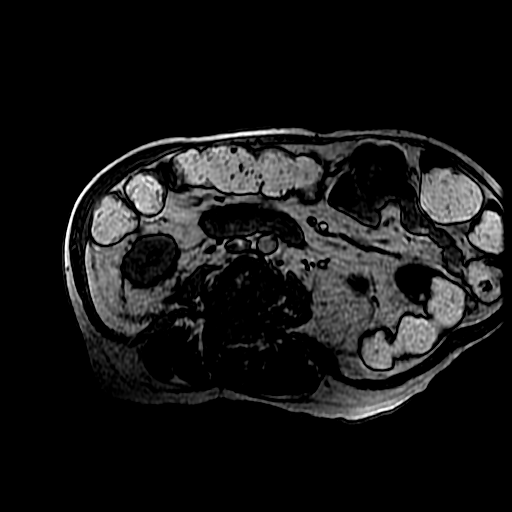
[im 34/100]
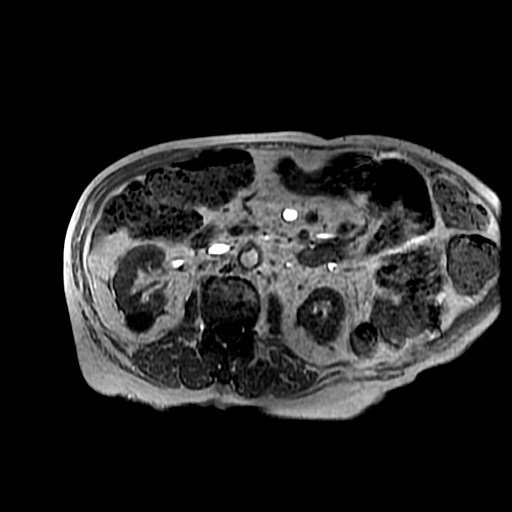

[21 of 48 positions shown; findings below may reference images not displayed]

FINDINGS: Examination is limited by breathing motion artifact. Patient was
unable hold her breath. Patient also refused contrast.

Lower chest: Chronic basilar scarring changes. The heart is mildly
enlarged.

Hepatobiliary: No focal hepatic lesions or intrahepatic biliary
dilatation. Normal caliber and course of the common bile duct. Low
insertion of the cystic duct remnant. Status post cholecystectomy.

Pancreas: 2.9 x 2.7 cm cystic lesion associated with the pancreas.
Lesion is exophytic. It appears well circumscribed and encapsulated.
No obvious septations or nodularity. No surrounding inflammation.
This has not significantly changed since the prior CT scan from
September 2015. It was also present on a remote CT scan from 8660 but
was much smaller. It is most likely a benign lesion. I do not think
followup but MRI is an option in this patient. I would recommend a
followup pancreatic protocol CT scan in 1 year.

Mild to moderate fatty change involving the pancreas. No ductal
dilatation. No other lesions.

Spleen:  Normal size.  No focal lesions.

Adrenals/Urinary Tract: Simple appearing bilateral renal cysts. No
worrisome renal lesions. The adrenal glands appear normal.

Stomach/Bowel: The stomach, duodenum and visualized small bowel are
grossly normal. Distended stool-filled colon is again noted and
suggests chronic constipation.

Vascular/Lymphatic: Advanced atherosclerotic calcifications
involving the aorta and branch vessels without focal aneurysm. No
mesenteric or retroperitoneal mass are adenopathy.

Other:  No ascites or abdominal wall hernia.

Musculoskeletal: No significant bony findings.
IMPRESSION: 1. 2.9 x 2.7 cm cystic lesion associated with the body region of the
pancreas slowly enlarging since 8660. This is likely benign and
there are no worrisome MR imaging features although the examination
is limited by lack of contrast which the patient refused. Recommend
followup pancreatic protocol CT scan and 1 year.
2. Status post cholecystectomy.  Normal pancreaticobiliary tree.
3. Simple appearing bilateral renal cysts.

## 2018-12-28 IMAGING — RF DG ESOPHAGUS
9 of 10 series · 12 of 13 positions shown · non-contrast
Comparison: None.

CLINICAL DATA: Dysphagia

EXAM:
ESOPHOGRAM/BARIUM SWALLOW
TECHNIQUE: Single contrast examination was performed using  thin barium.
FLUOROSCOPY TIME:  Fluoroscopy Time:  1 minutes 18 seconds
Radiation Exposure Index (if provided by the fluoroscopic device):
7.1 mGy

[Series 1: cp_standard · 0.35mm/px · 4 of 8 frames shown (1 of 9)]
[frame 2/8]
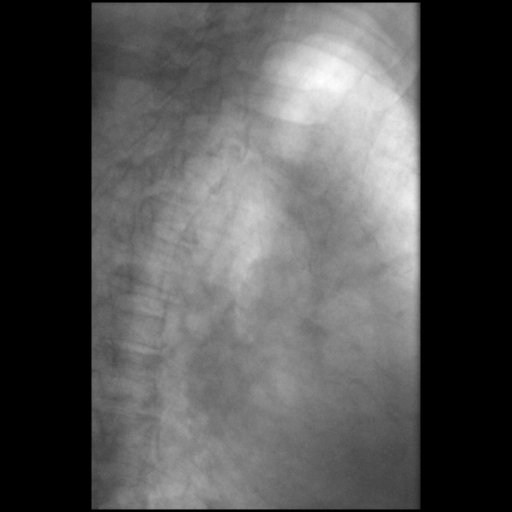
[frame 3/8]
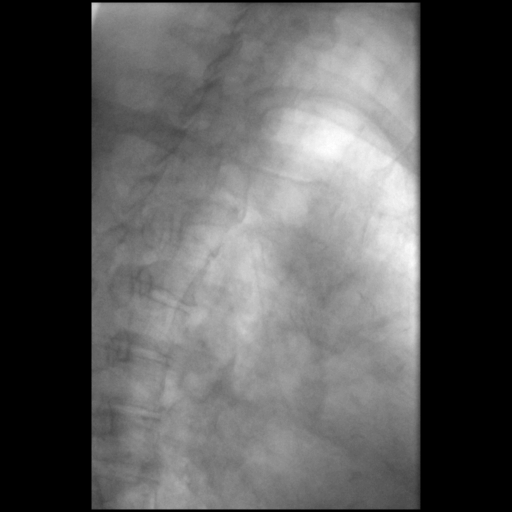
[frame 5/8]
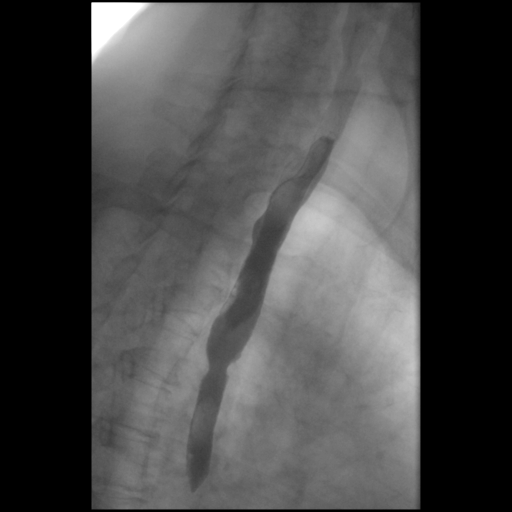
[frame 7/8]
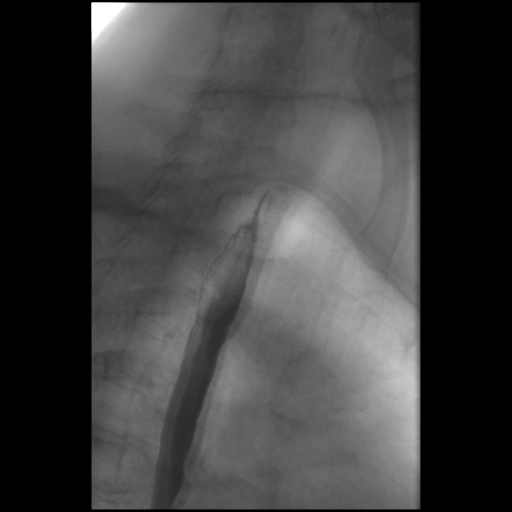

[Series 2: cp_standard · 0.17mm/px · 1 of 1 slices shown (2 of 9)]
[im 1/1]
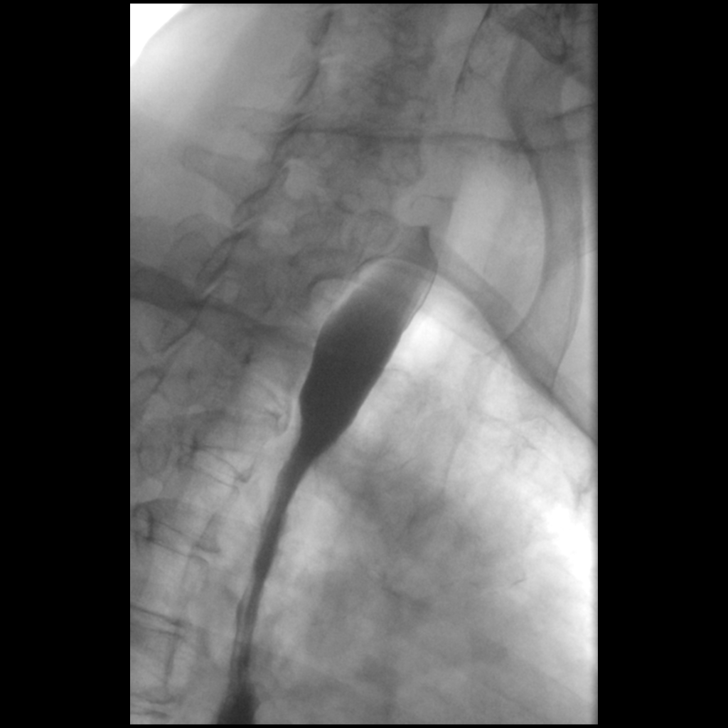

[Series 3: cp_standard · 0.17mm/px · 1 of 1 slices shown (3 of 9)]
[im 1/1]
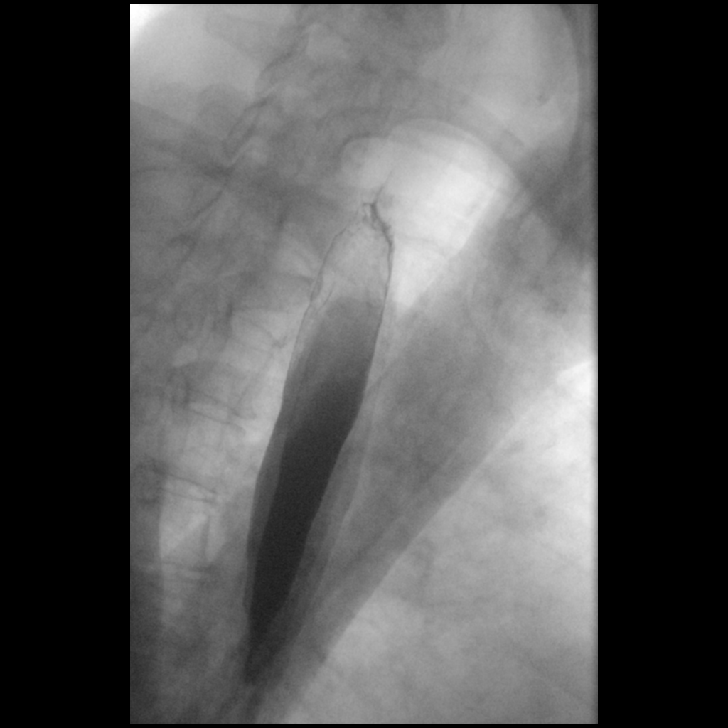

[Series 5: cp_standard · 0.17mm/px · 1 of 1 slices shown (4 of 9)]
[im 1/1]
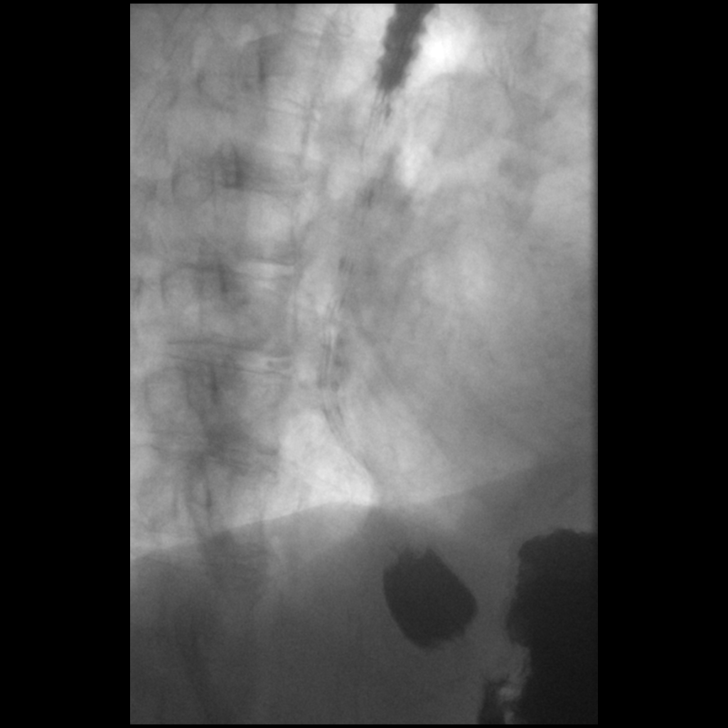

[Series 6: cp_standard · 0.17mm/px · 1 of 1 slices shown (5 of 9)]
[im 1/1]
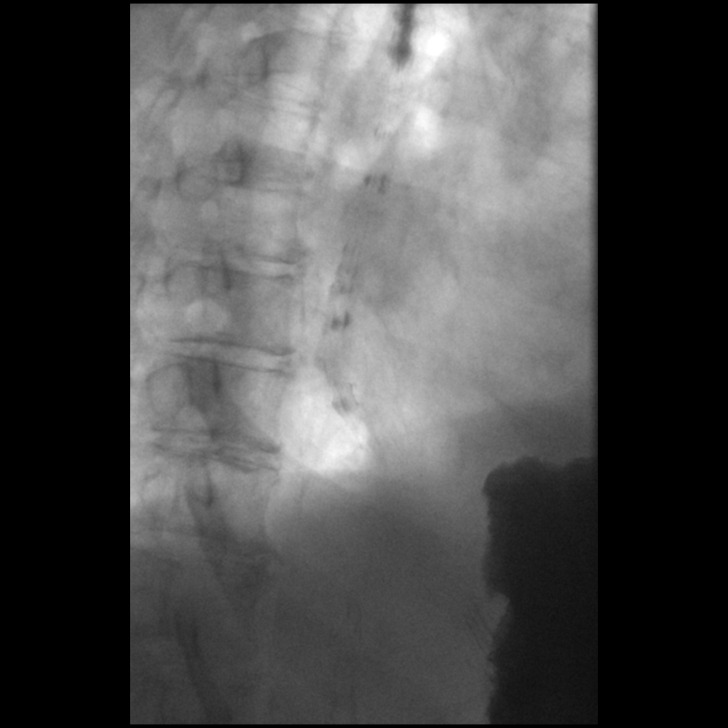

[Series 7: cp_standard · 0.18mm/px · 1 of 1 slices shown (6 of 9)]
[im 1/1]
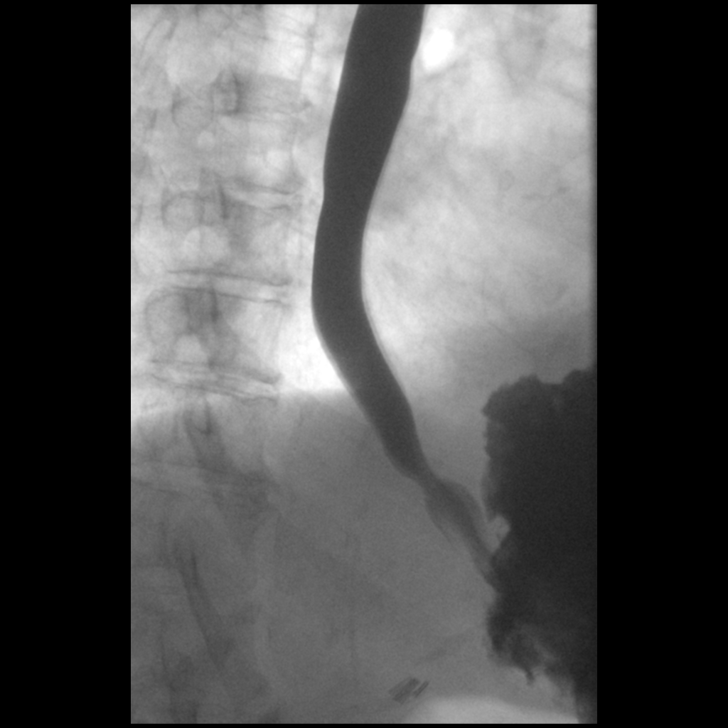

[Series 8: cp_standard · 0.17mm/px · 1 of 1 slices shown (7 of 9)]
[im 1/1]
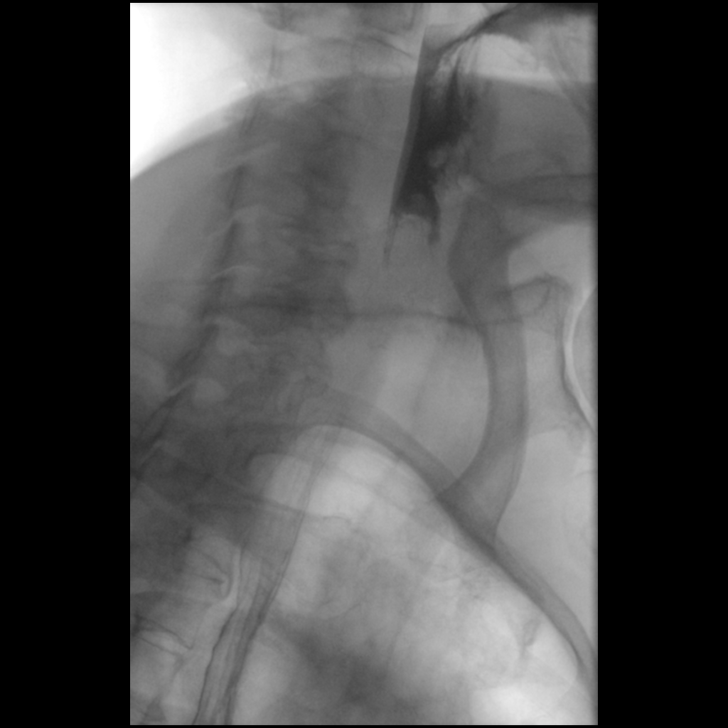

[Series 9: cp_standard · 0.17mm/px · 1 of 1 slices shown (8 of 9)]
[im 1/1]
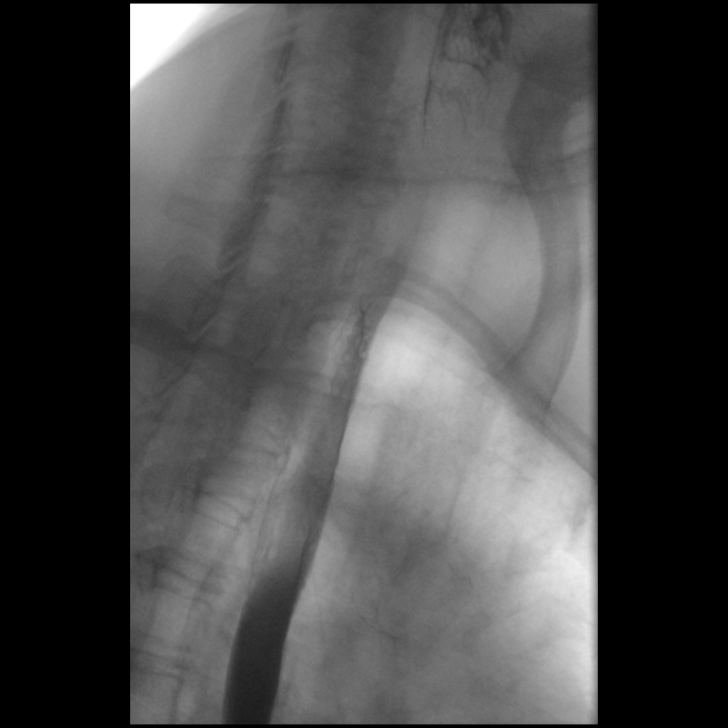

[Series 10: cp_standard · 0.18mm/px · 1 of 1 slices shown (9 of 9)]
[im 1/1]
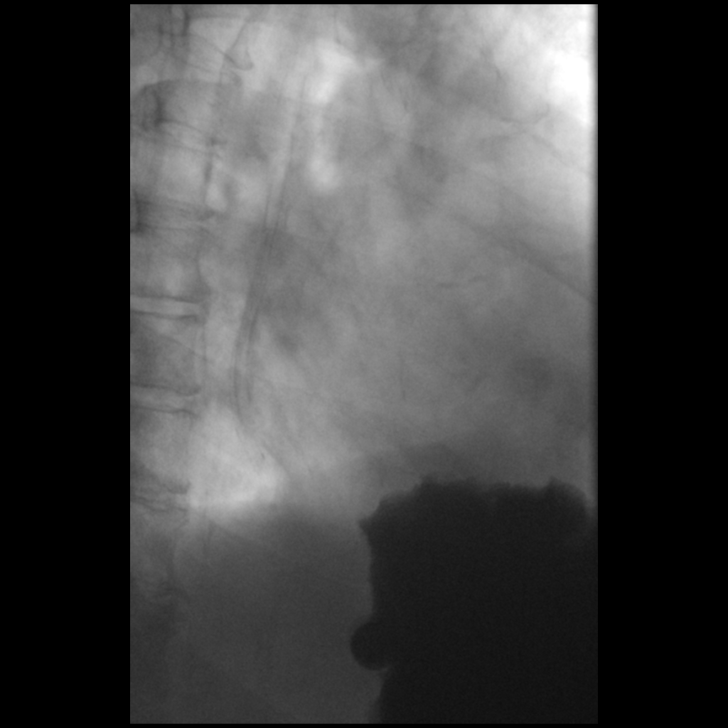

[12 of 13 positions shown; findings below may reference images not displayed]

FINDINGS: The esophagus is patent. No stricture or mass identified. There is a
mild esophageal dysmotility with non propulsive tertiary waves
noted. No hiatal hernia or reflux visualize. The patient ingested a
barium tablet which easily passed through the esophagus and into the
stomach.
IMPRESSION: 1. Patent esophagus without evidence for stricture or mass.
2. No hiatal hernia or reflux.
3. Mild esophageal dysmotility.

## 2019-02-12 IMAGING — CR DG HIP (WITH OR WITHOUT PELVIS) 2-3V*R*
4 series · 4 of 4 positions shown · non-contrast
Comparison: None.

CLINICAL DATA: Recent fall. Worsening right hip pain for 1 week.
Initial encounter.

EXAM:
DG HIP (WITH OR WITHOUT PELVIS) 2-3V RIGHT

[t pelvis ap]
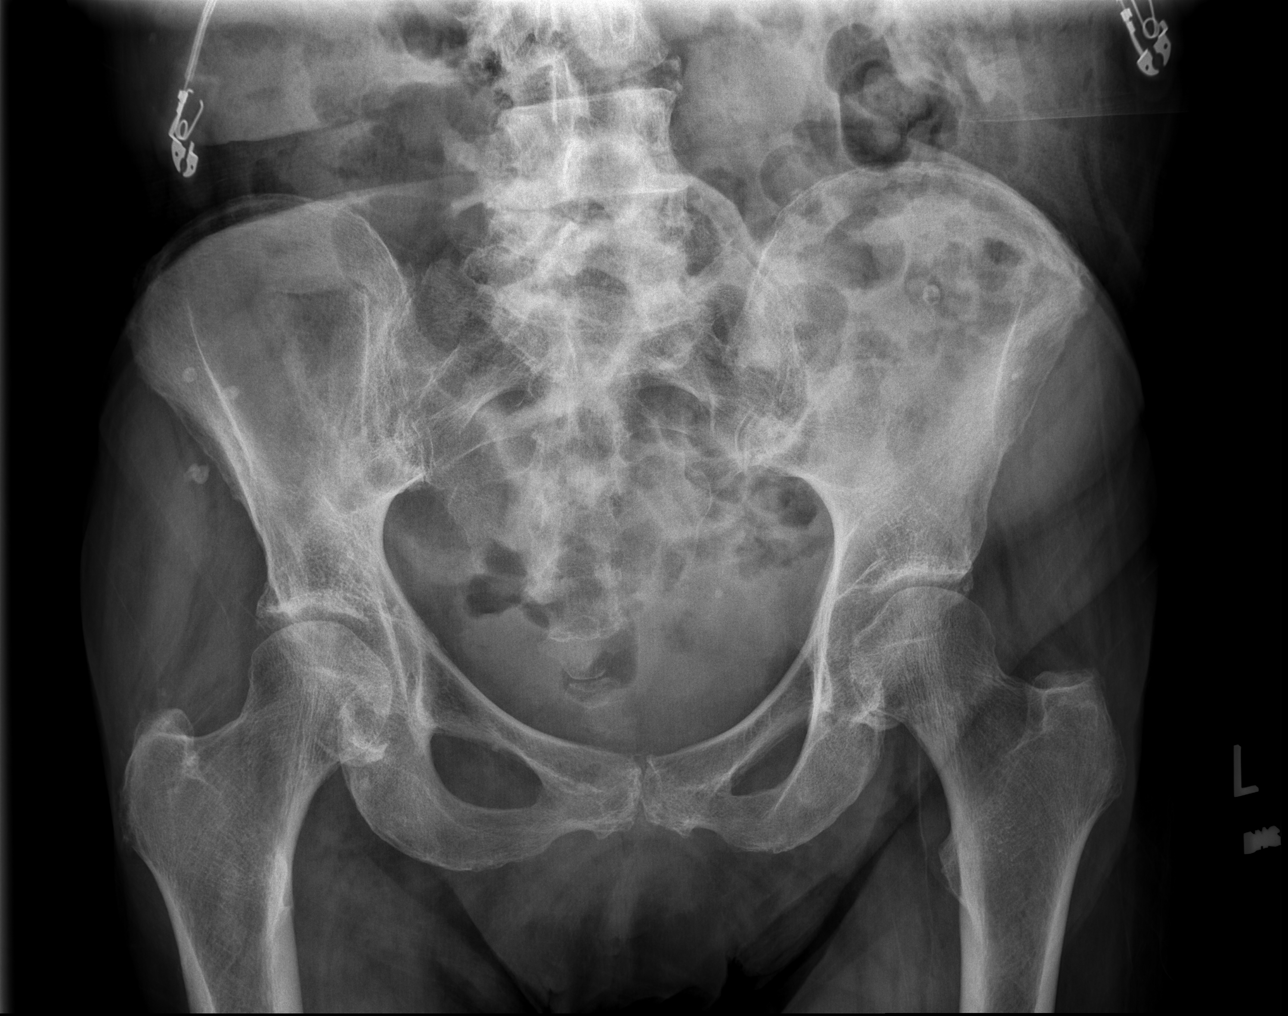

[t hip ap right]
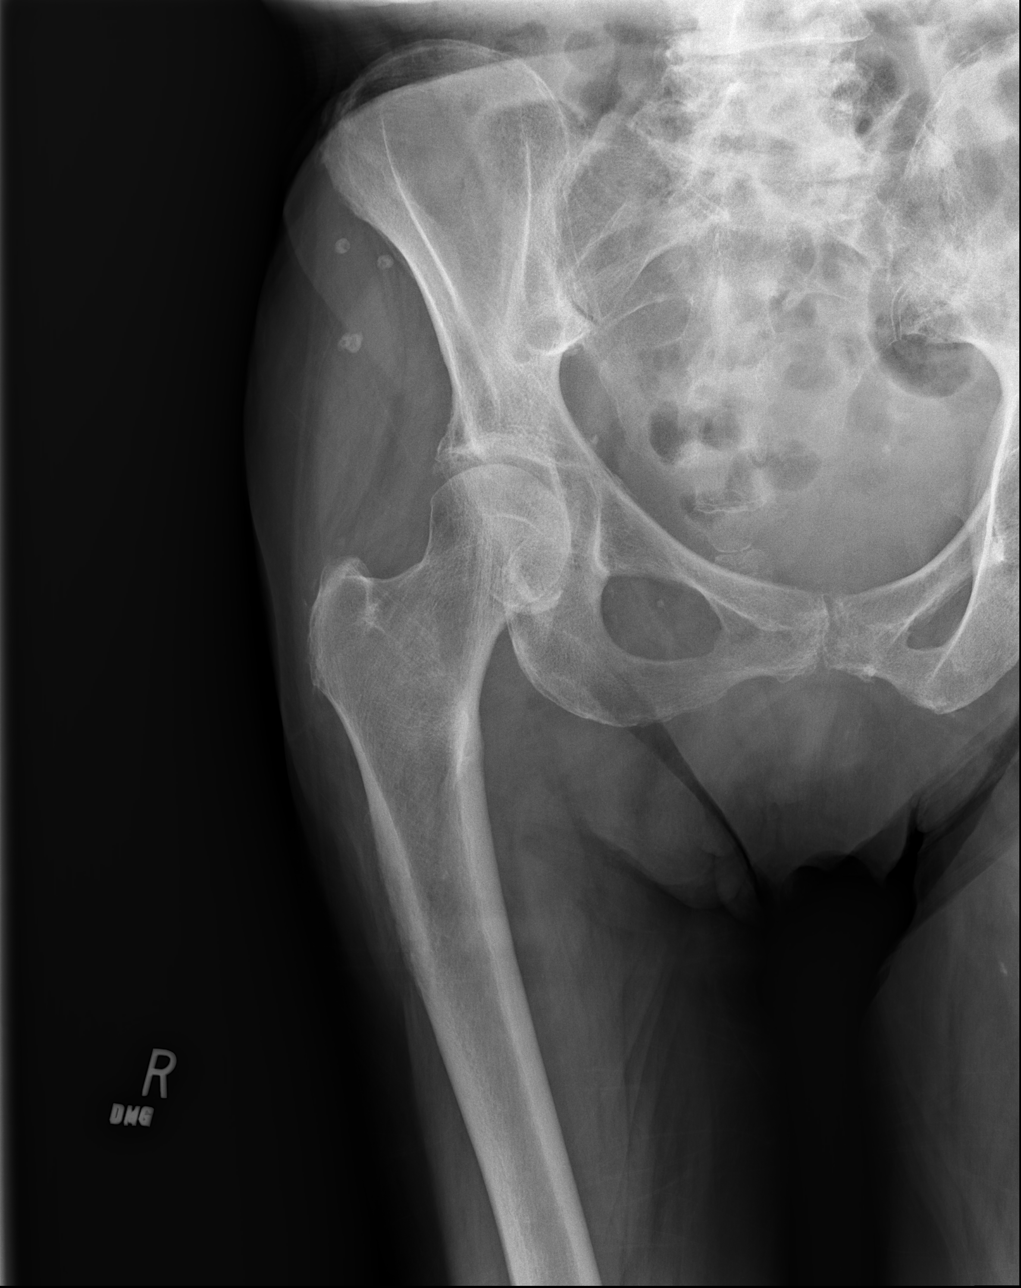

[w hip lat right (1 of 2)]
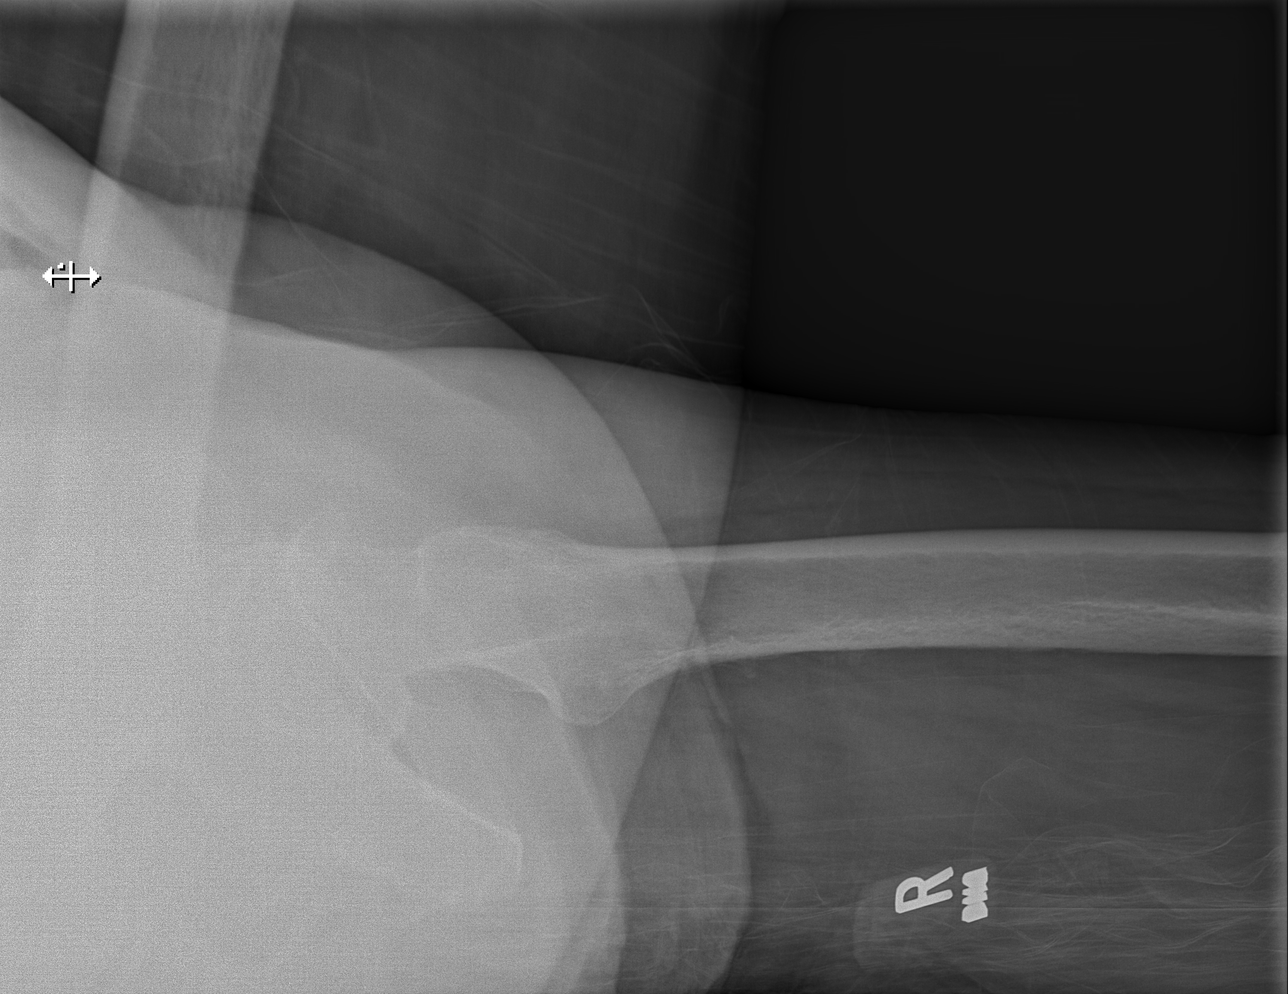

[w hip lat right (2 of 2)]
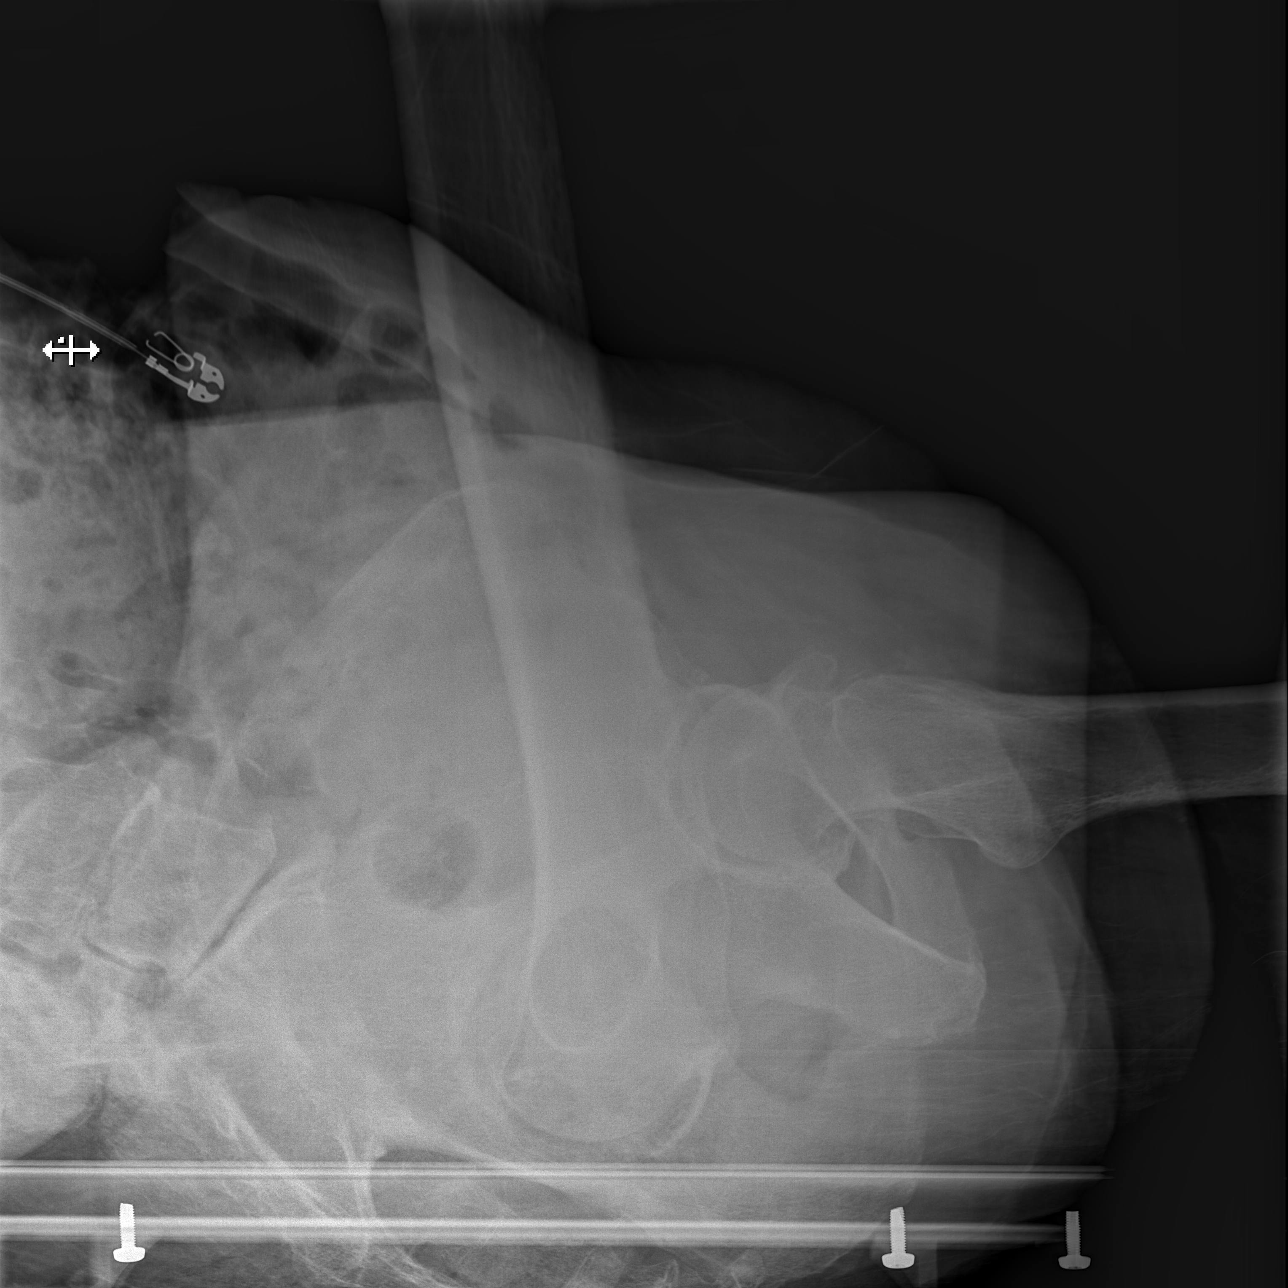

[4 of 4 positions shown; findings below may reference images not displayed]

FINDINGS: There is no evidence of hip fracture or dislocation. There is no
evidence of hip joint arthropathy or other focal bone abnormality.
Mild degenerative changes are seen involving the pubic symphysis and
visualized lower lumbar spine.
IMPRESSION: No acute findings.

Mild degenerative changes involving pubic symphysis and lower lumbar
spine.

## 2019-02-12 IMAGING — CR DG CHEST 2V
2 series · 2 of 2 positions shown · non-contrast
Comparison: 01/25/2017

CLINICAL DATA: Worsening hip pain.

EXAM:
CHEST  2 VIEW

[w chest lat 8-[id] (21-28cm)]
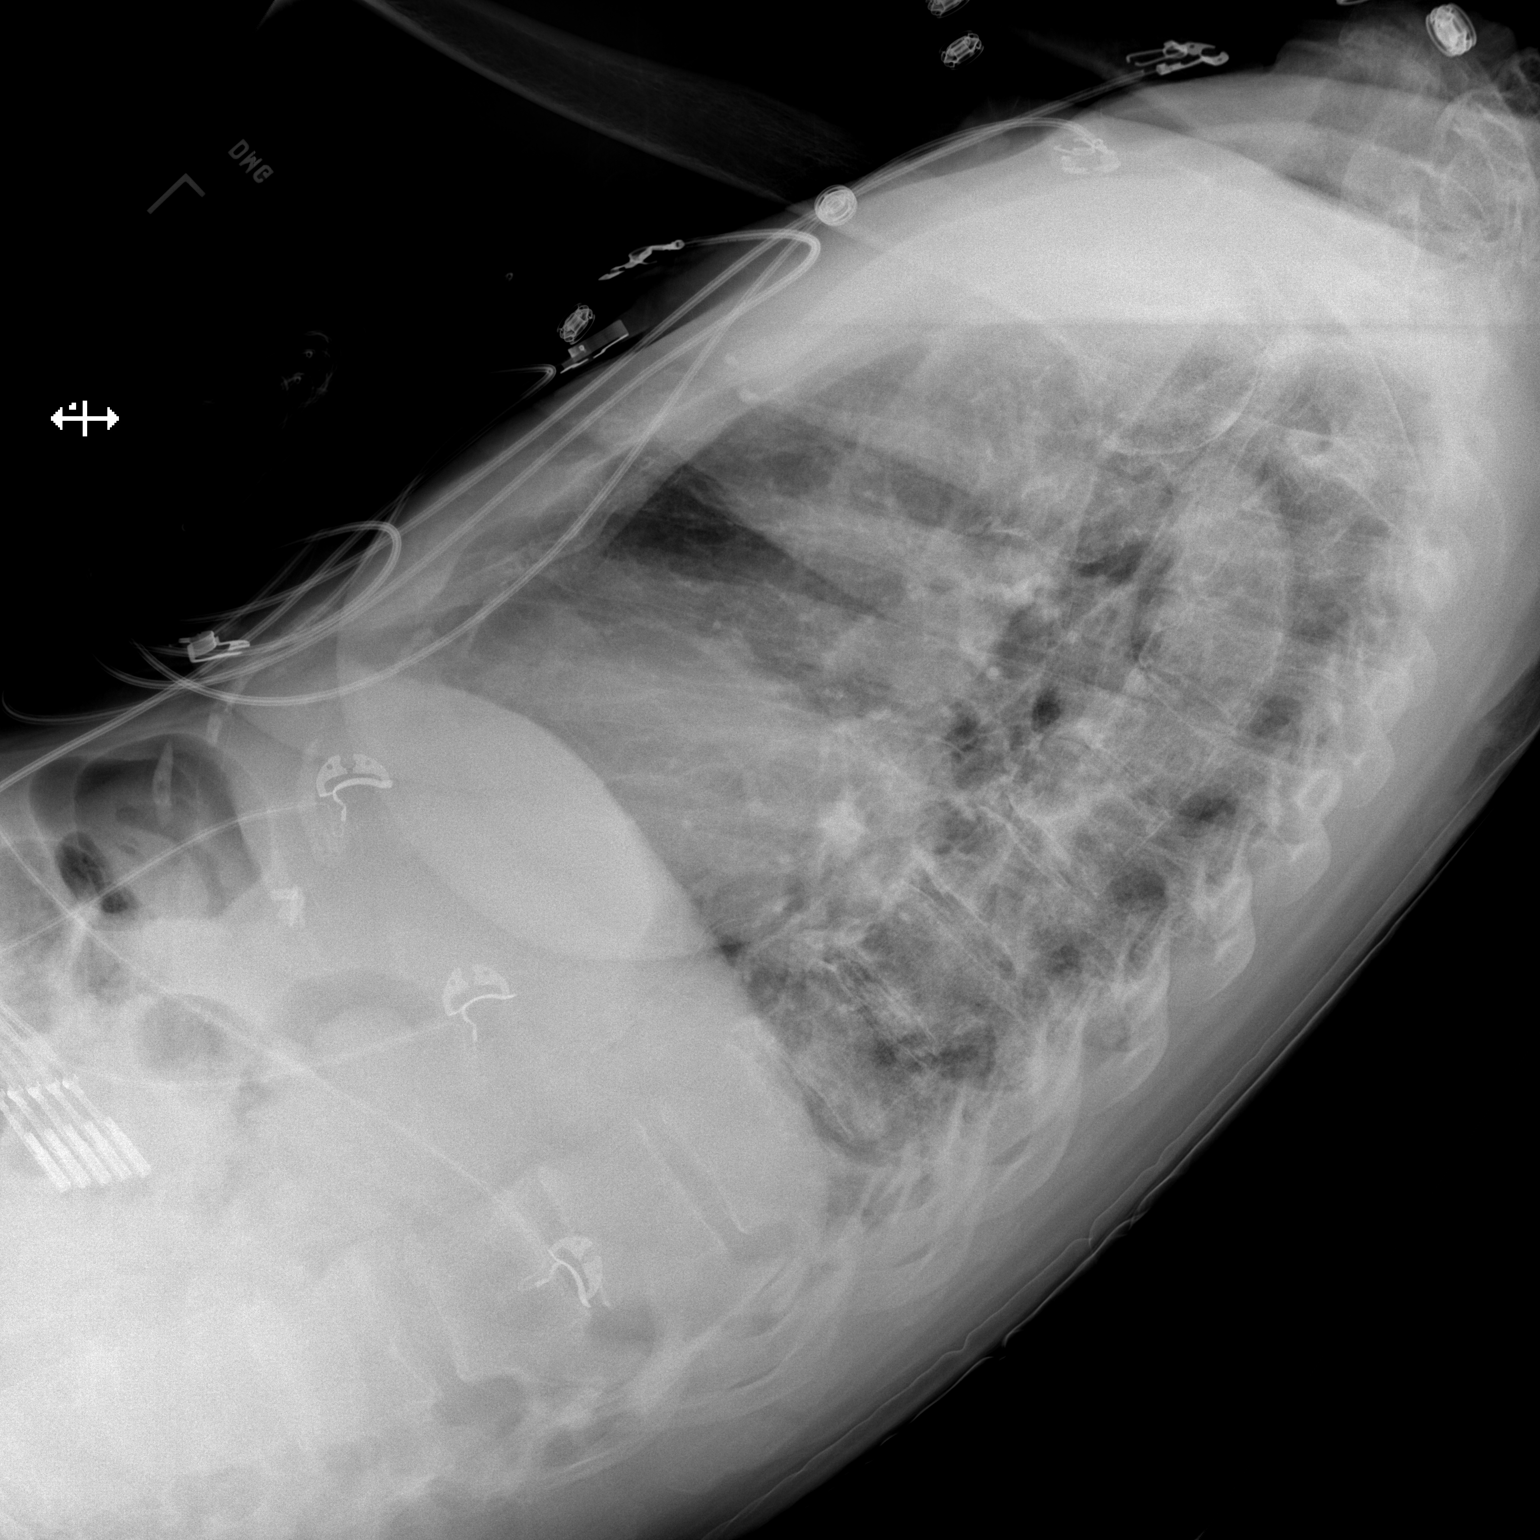

[x chest ap]
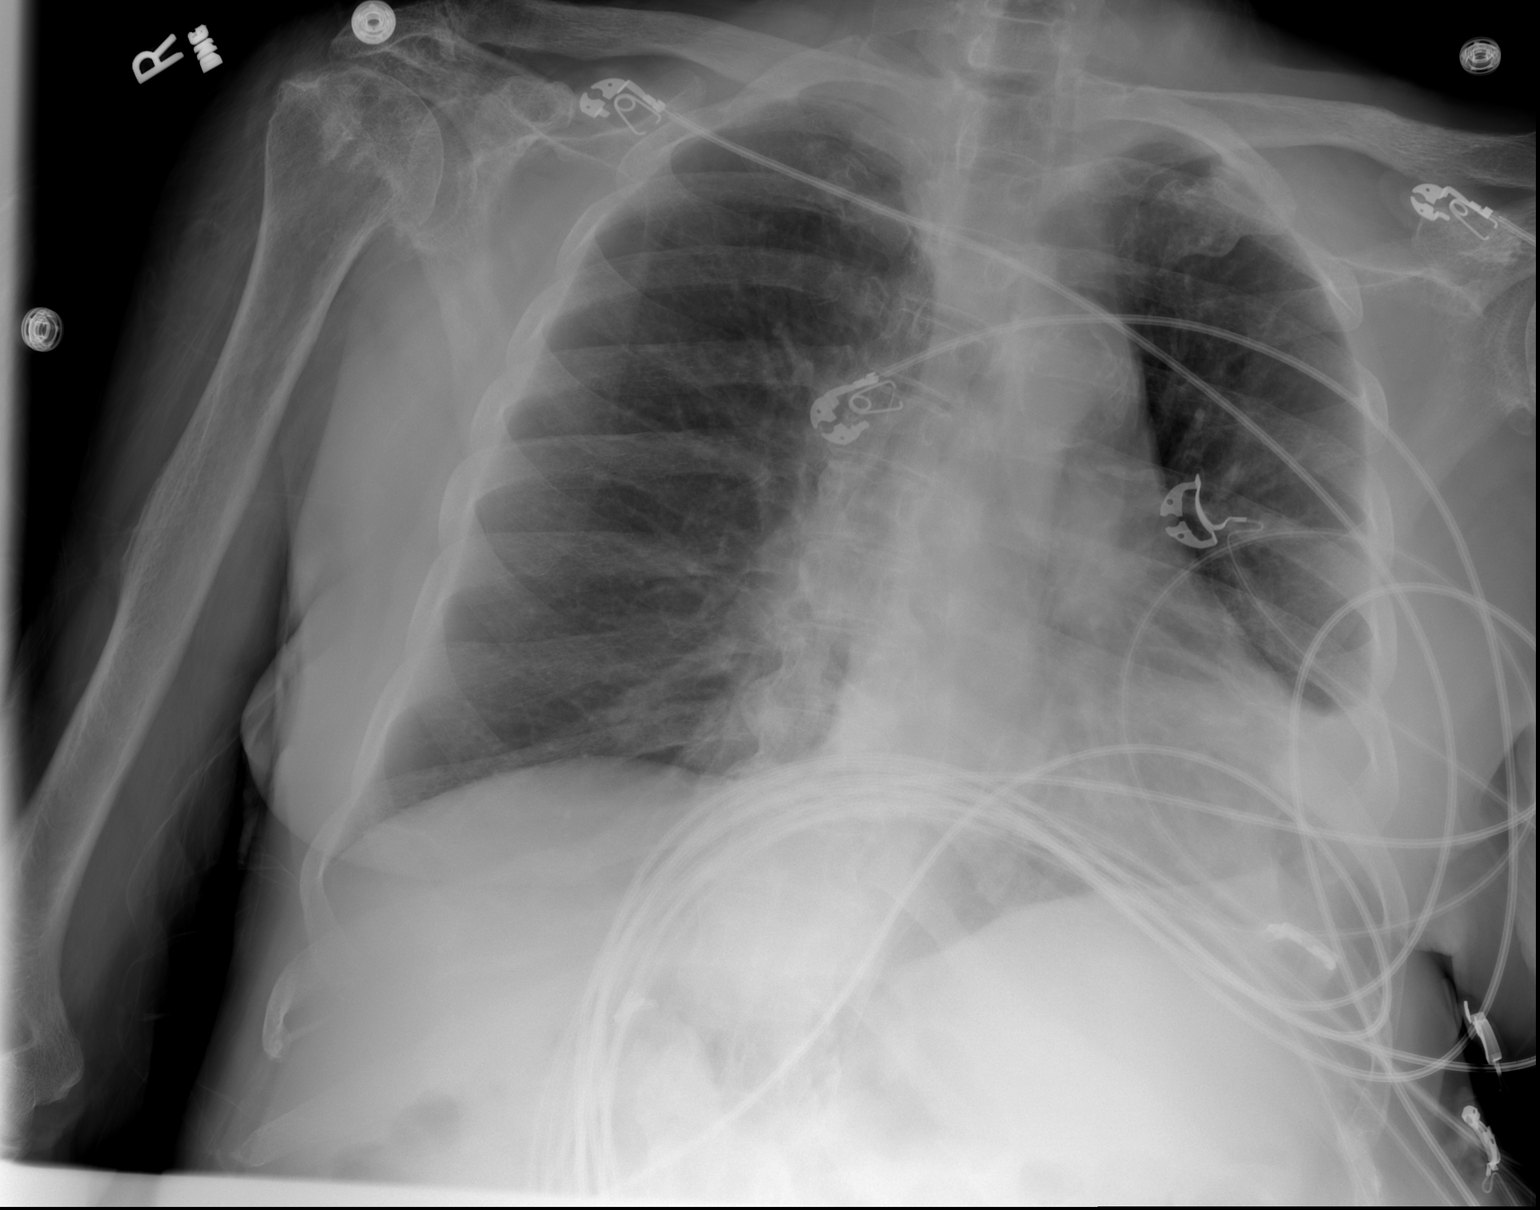

[2 of 2 positions shown; findings below may reference images not displayed]

FINDINGS: Artifact overlies the chest. The right lung is clear. There is some
chronic scarring at the left base, but this may be more pronounced,
suggesting some left base atelectasis or infiltrate. No measurable
effusion. No acute bone finding.
IMPRESSION: Chronic scarring at the left base, but possibly with some slightly
worsened atelectasis or infiltrate.

## 2019-02-13 IMAGING — US US ABDOMEN LIMITED
1 series · 10 of 10 positions shown · non-contrast
Comparison: None.

CLINICAL DATA: Possible ascites requiring paracentesis.

EXAM:
LIMITED ABDOMEN ULTRASOUND FOR ASCITES
TECHNIQUE: Limited ultrasound survey for ascites was performed in all four
abdominal quadrants.

[Series 1: us abdomen limited · 0.26mm/px · 10 of 10 slices shown]
[im 1/10]
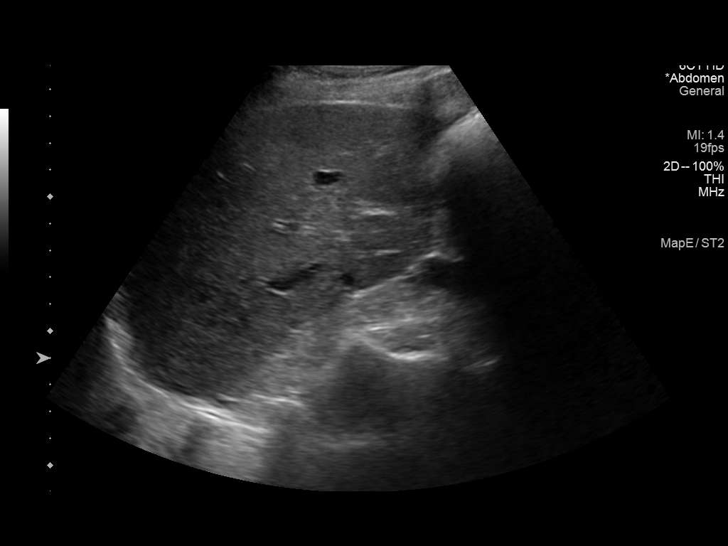
[im 2/10]
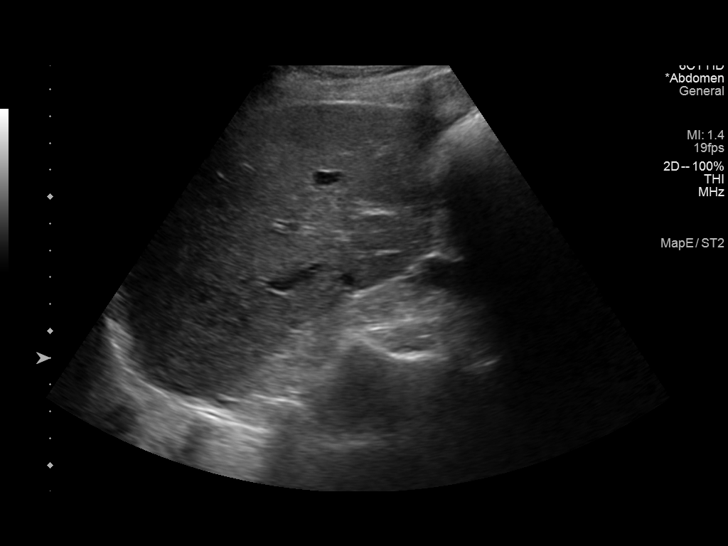
[im 3/10]
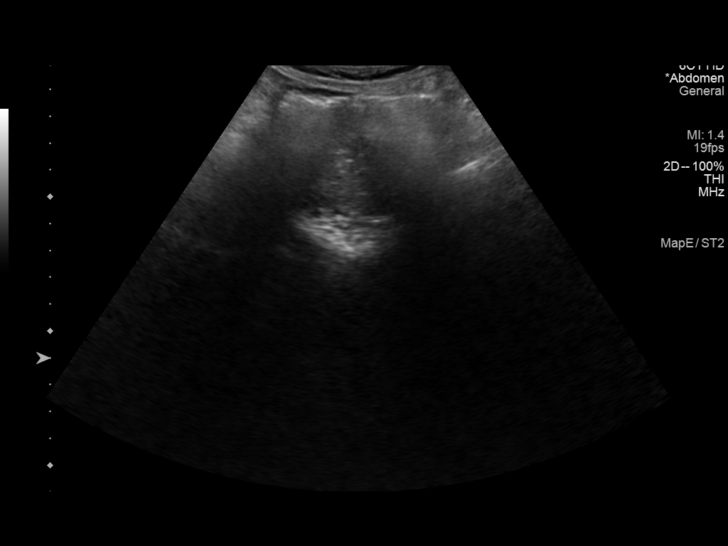
[im 4/10]
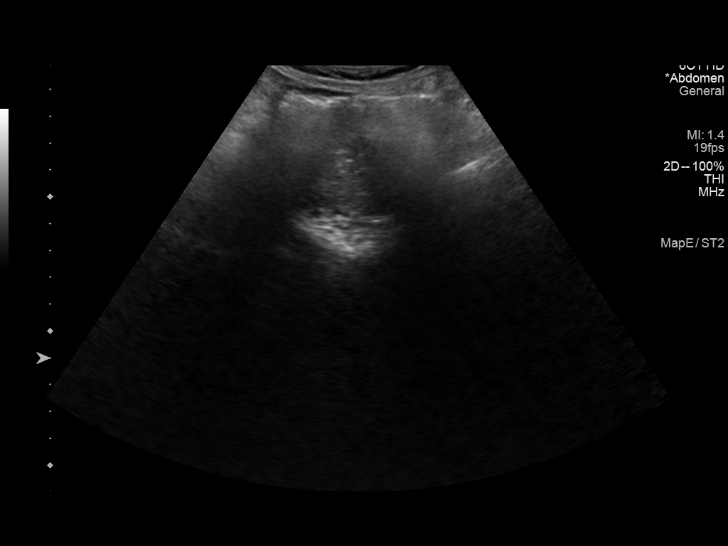
[im 5/10]
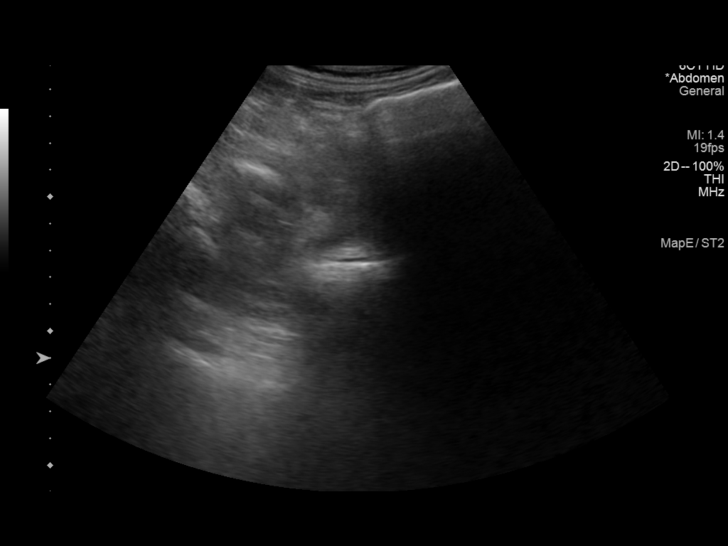
[im 6/10]
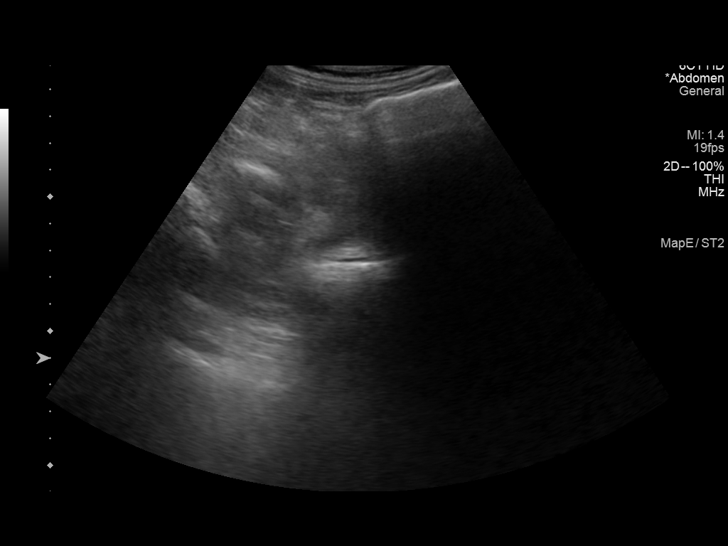
[im 7/10]
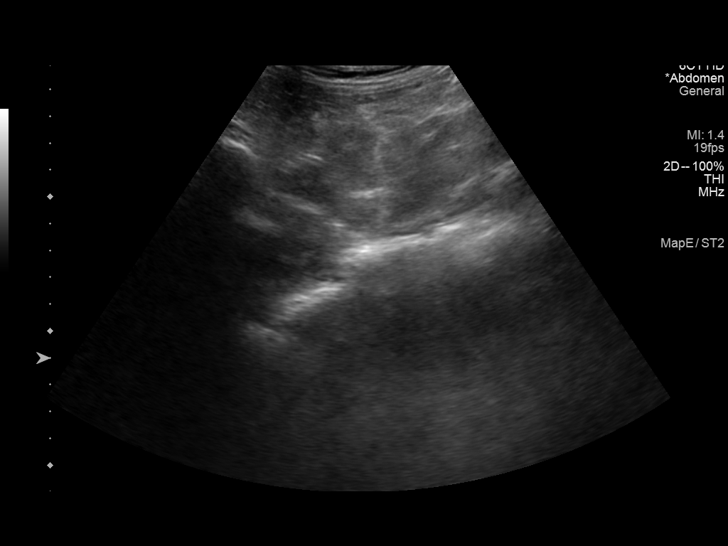
[im 8/10]
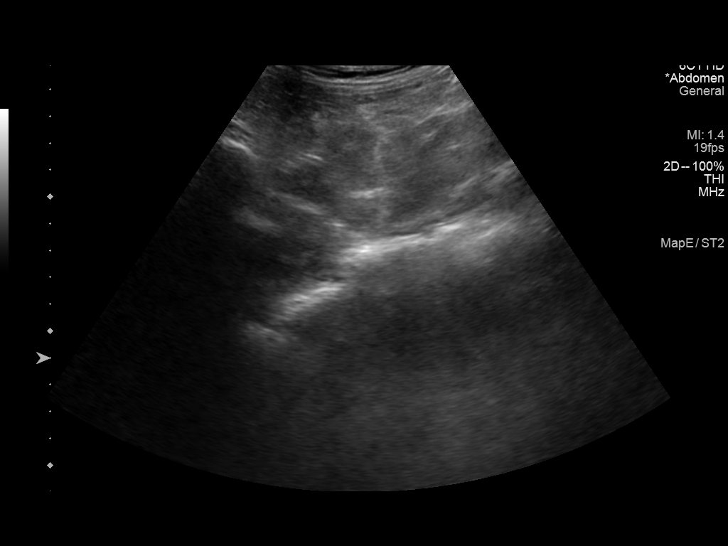
[im 9/10]
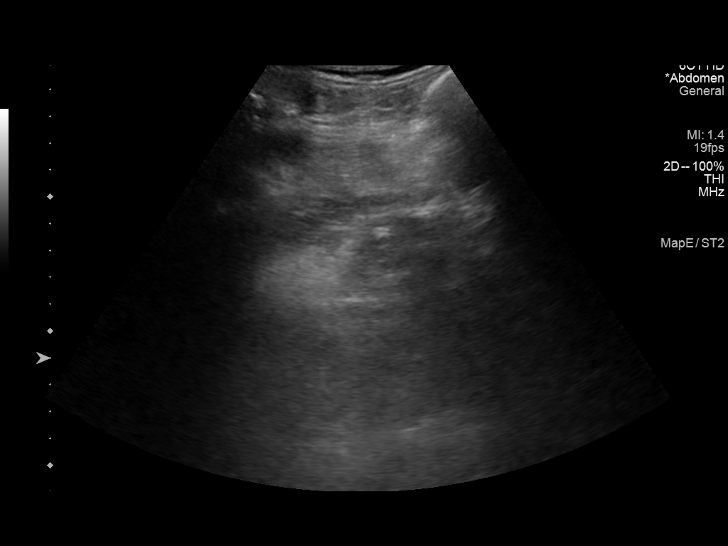
[im 10/10]
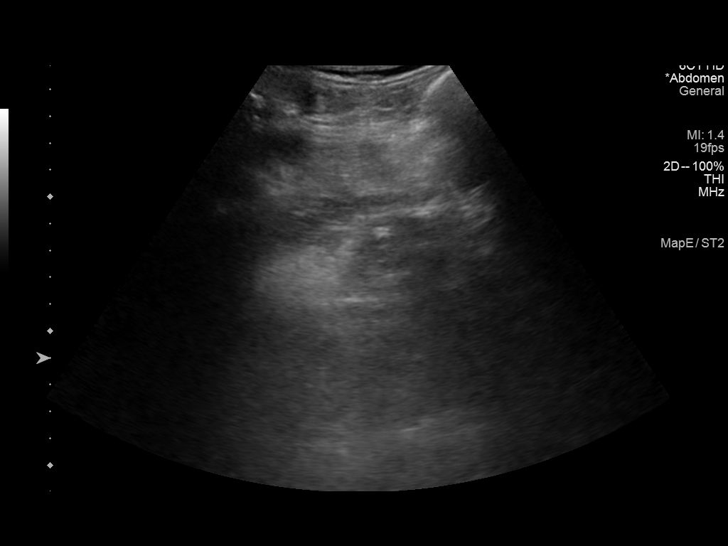

[10 of 10 positions shown; findings below may reference images not displayed]

FINDINGS: Survey of the peritoneal cavity shows no ascites. Paracentesis was
therefore not performed.
IMPRESSION: No ascites identified in the peritoneal cavity.

## 2019-02-13 IMAGING — CT CT HIP*R* W/O CM
2 of 3 series · 17 of 46 positions shown, 19 images · non-contrast
Comparison: Right hip x-rays from yesterday. CT abdomen pelvis
dated January 16, 2017.

CLINICAL DATA: Worsening right hip pain after recent fall.

EXAM:
CT OF THE RIGHT HIP WITHOUT CONTRAST
TECHNIQUE: Multidetector CT imaging of the right hip was performed according to
the standard protocol. Multiplanar CT image reconstructions were
also generated.

[Series 4: axial st · axial · 0.44mm/px · z∈[+881,+1029]mm · 14 of 86 slices shown, 16 images]
[im 6/86  soft-tissue]
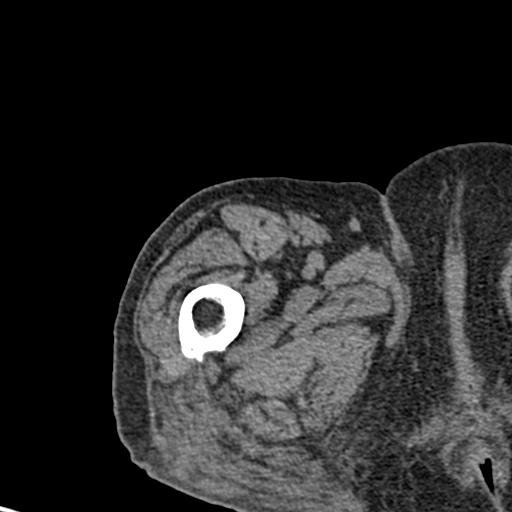
[im 6/86  bone]
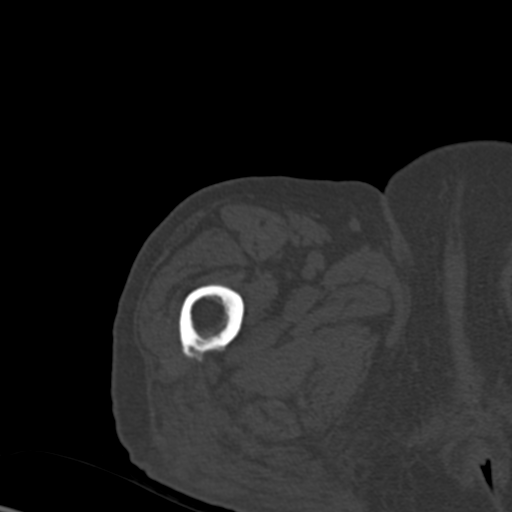
[im 11/86  soft-tissue]
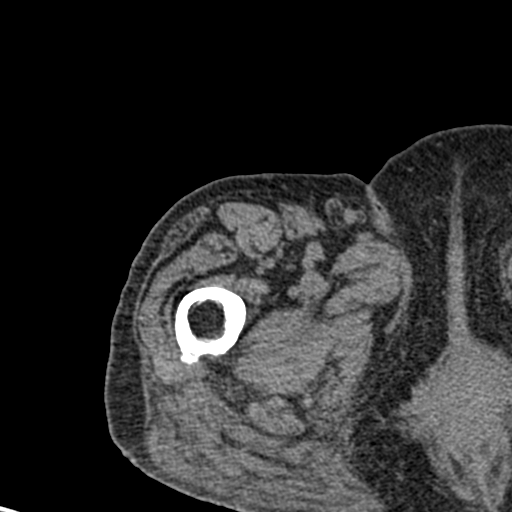
[im 17/86  soft-tissue]
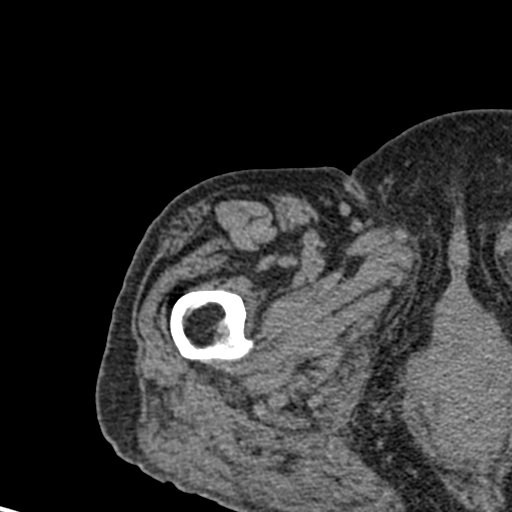
[im 22/86  soft-tissue]
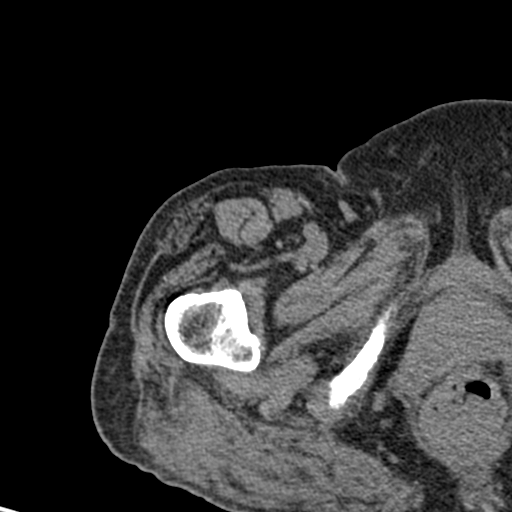
[im 28/86  soft-tissue]
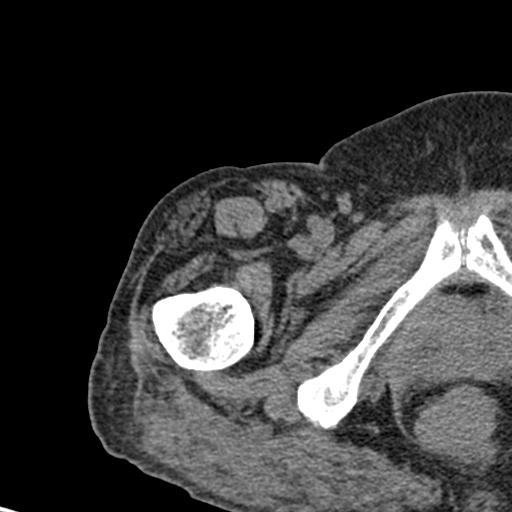
[im 33/86  soft-tissue]
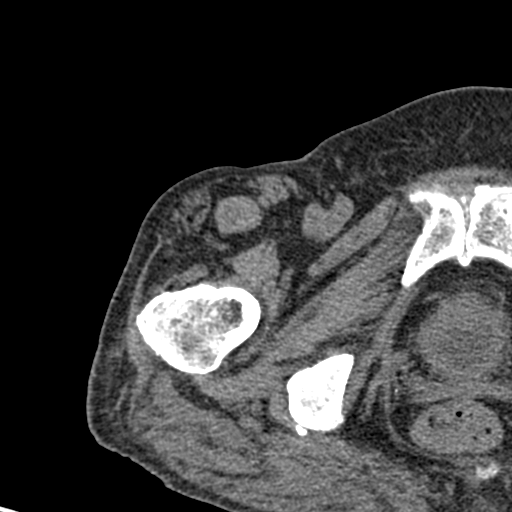
[im 39/86  soft-tissue]
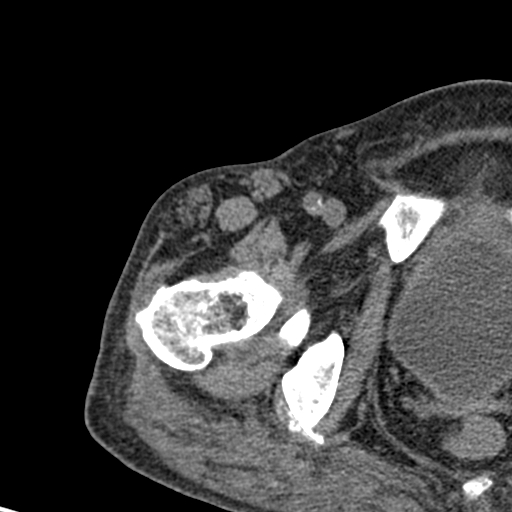
[im 47/86  soft-tissue]
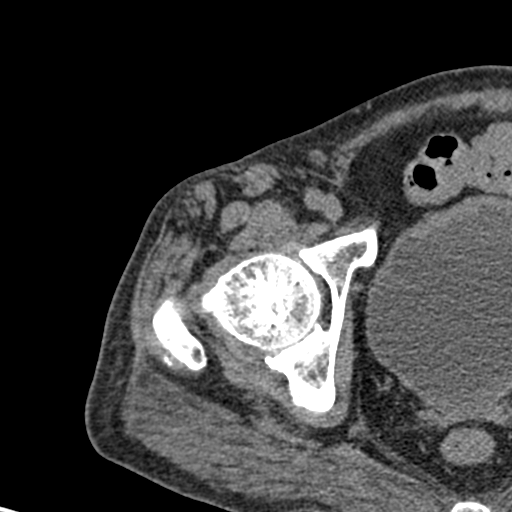
[im 53/86  soft-tissue]
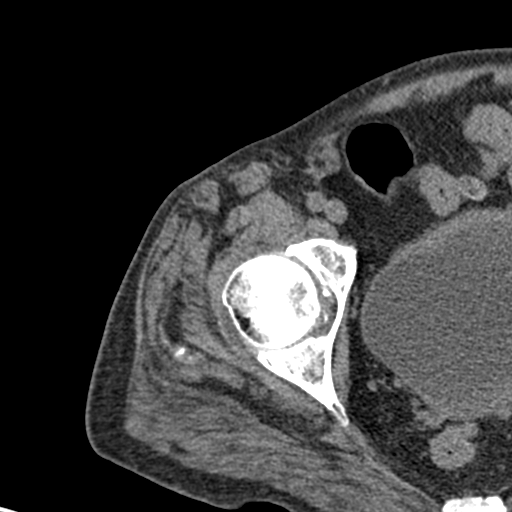
[im 53/86  bone]
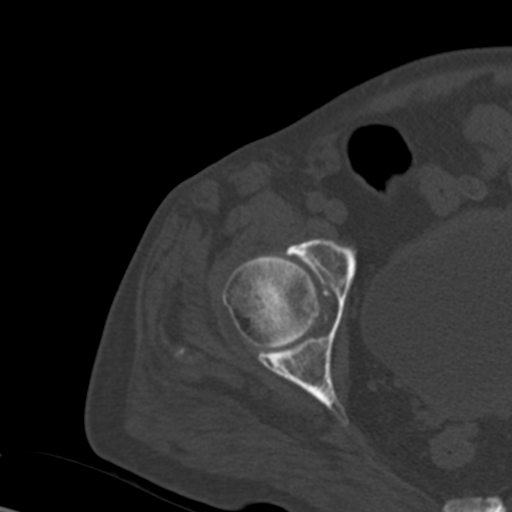
[im 58/86  soft-tissue]
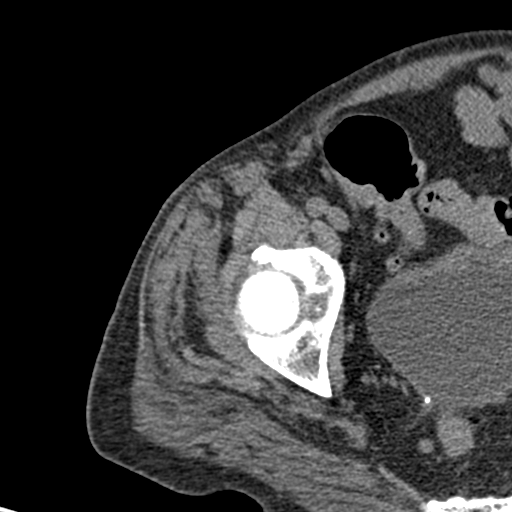
[im 64/86  soft-tissue]
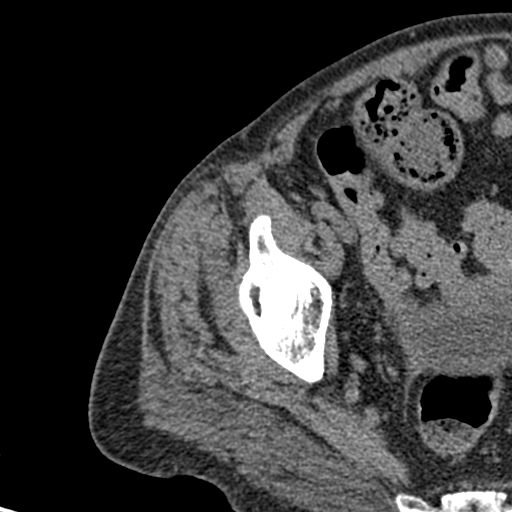
[im 69/86  soft-tissue]
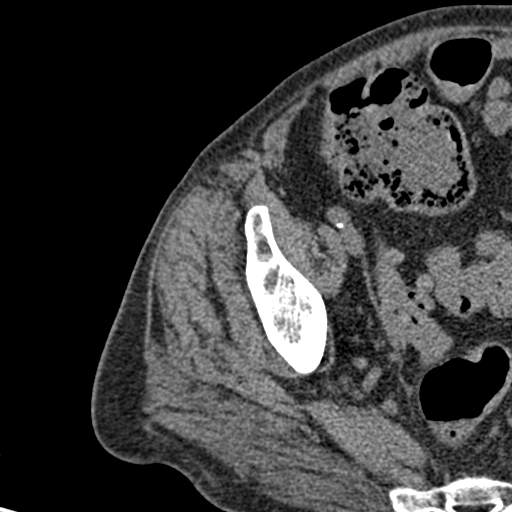
[im 75/86  soft-tissue]
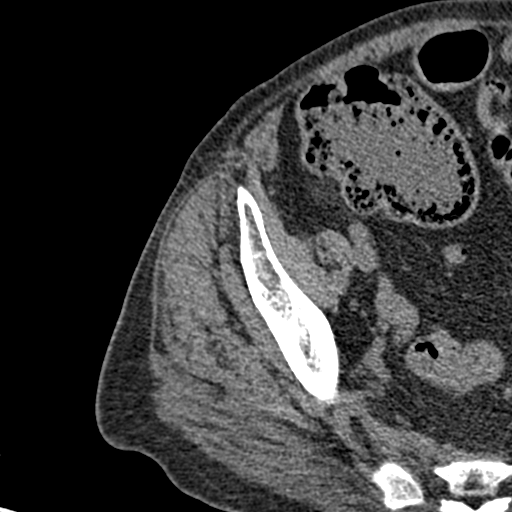
[im 80/86  soft-tissue]
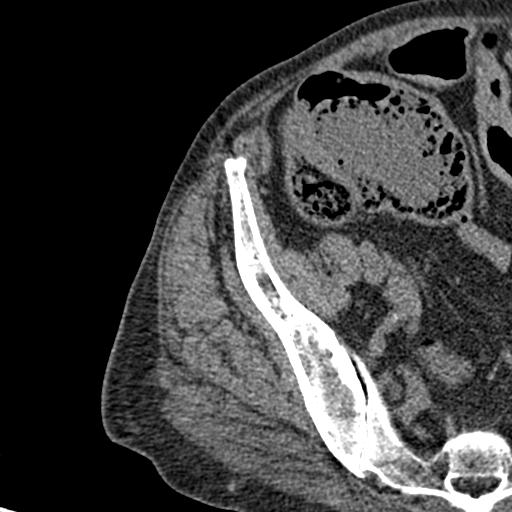

[Series 7: coronal st · coronal · 0.38mm/px · 3 of 87 slices shown]
[im 29/87  soft-tissue]
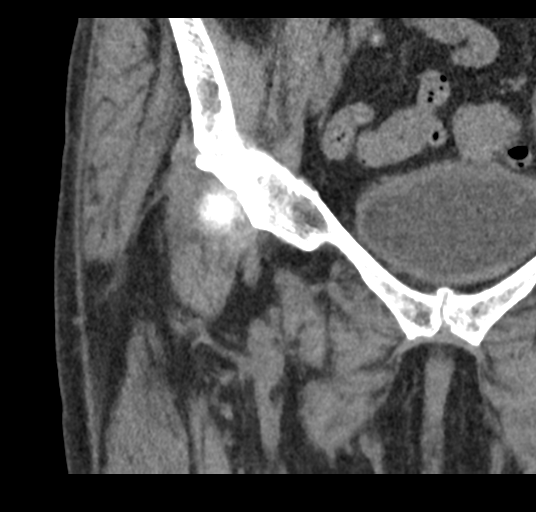
[im 39/87  soft-tissue]
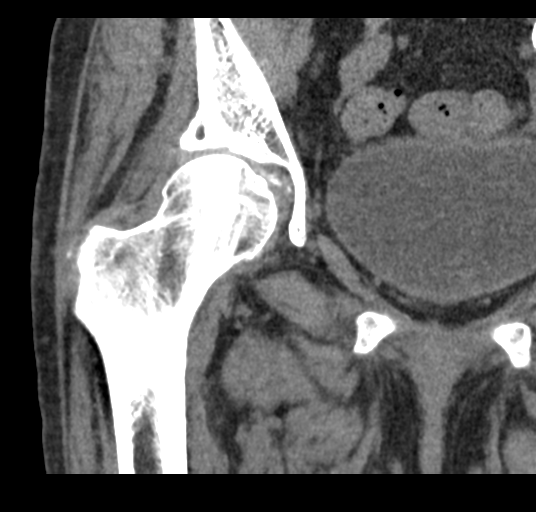
[im 48/87  soft-tissue]
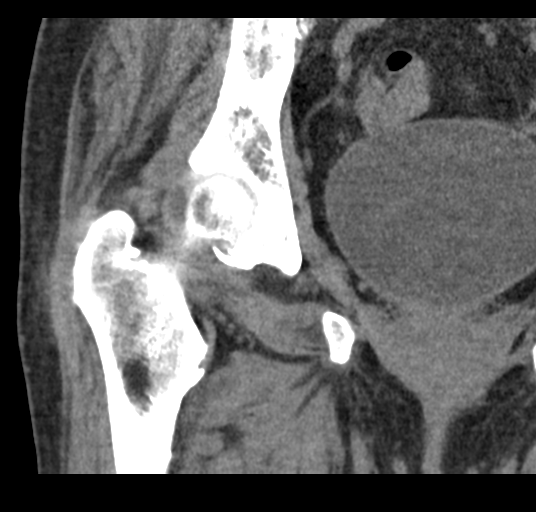

[17 of 46 positions shown; findings below may reference images not displayed]

FINDINGS: Bones/Joint/Cartilage

No acute fracture or malalignment. No joint effusion. Degenerative
changes of the right hip, pubic symphysis, and right sacroiliac
joint. Subchondral cystic change in the right acetabulum.
Questionable chondrocalcinosis in the right hip joint. Osteopenia.

Ligaments

Suboptimally assessed by CT.

Muscles and Tendons

Dystrophic calcifications at the right hamstring origins, consistent
with prior injury. No significant muscle atrophy.

Soft tissues

Sigmoid diverticulosis.  No acute intrapelvic abnormality.
IMPRESSION: 1.  No acute osseous abnormality.
2. Mild degenerative changes of the right hip joint with
questionable chondrocalcinosis, which could be age related or
reflect CPPD arthropathy.

## 2019-02-18 IMAGING — DX DG CHEST 2V
3 series · 3 of 3 positions shown · non-contrast
Comparison: 03/14/2017, 09/13/2016, 08/10/2015 chest radiograph.
01/26/2017 CT chest.

CLINICAL DATA: 76 y/o F; weakness and cough. History of CAD, COPD,
pneumonia, diabetes, and stroke. History of cardiac cath in 2634.
Former smoker.

EXAM:
CHEST  2 VIEW

[chest lat (1 of 2)]
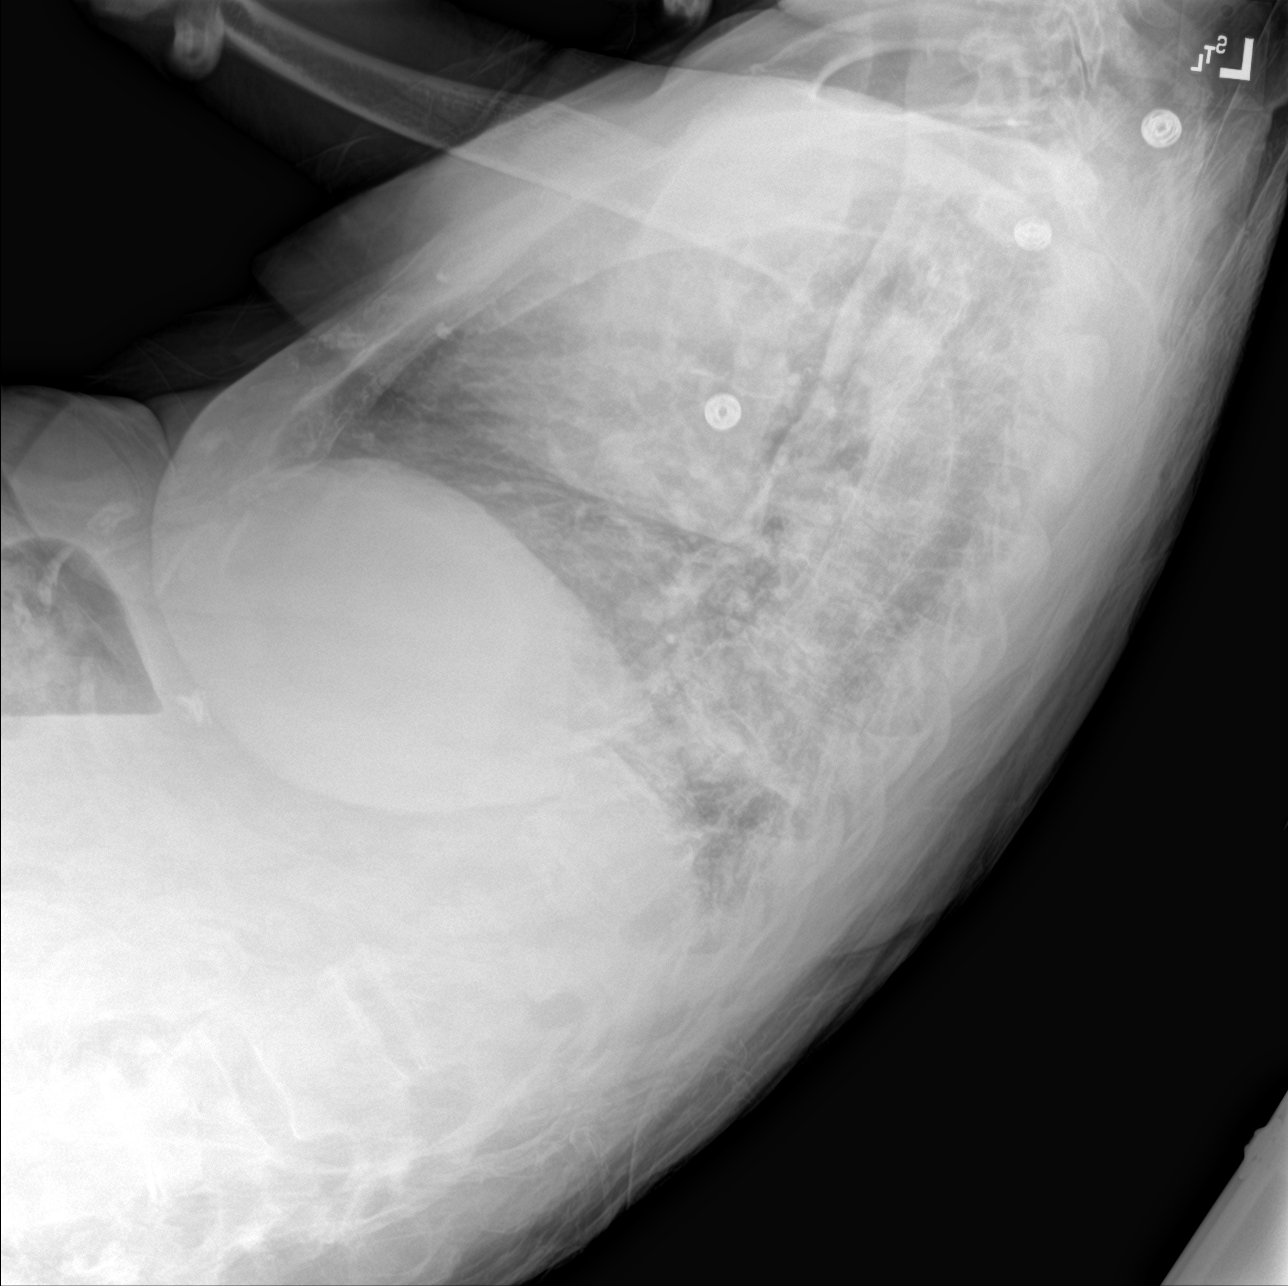

[chest ap]
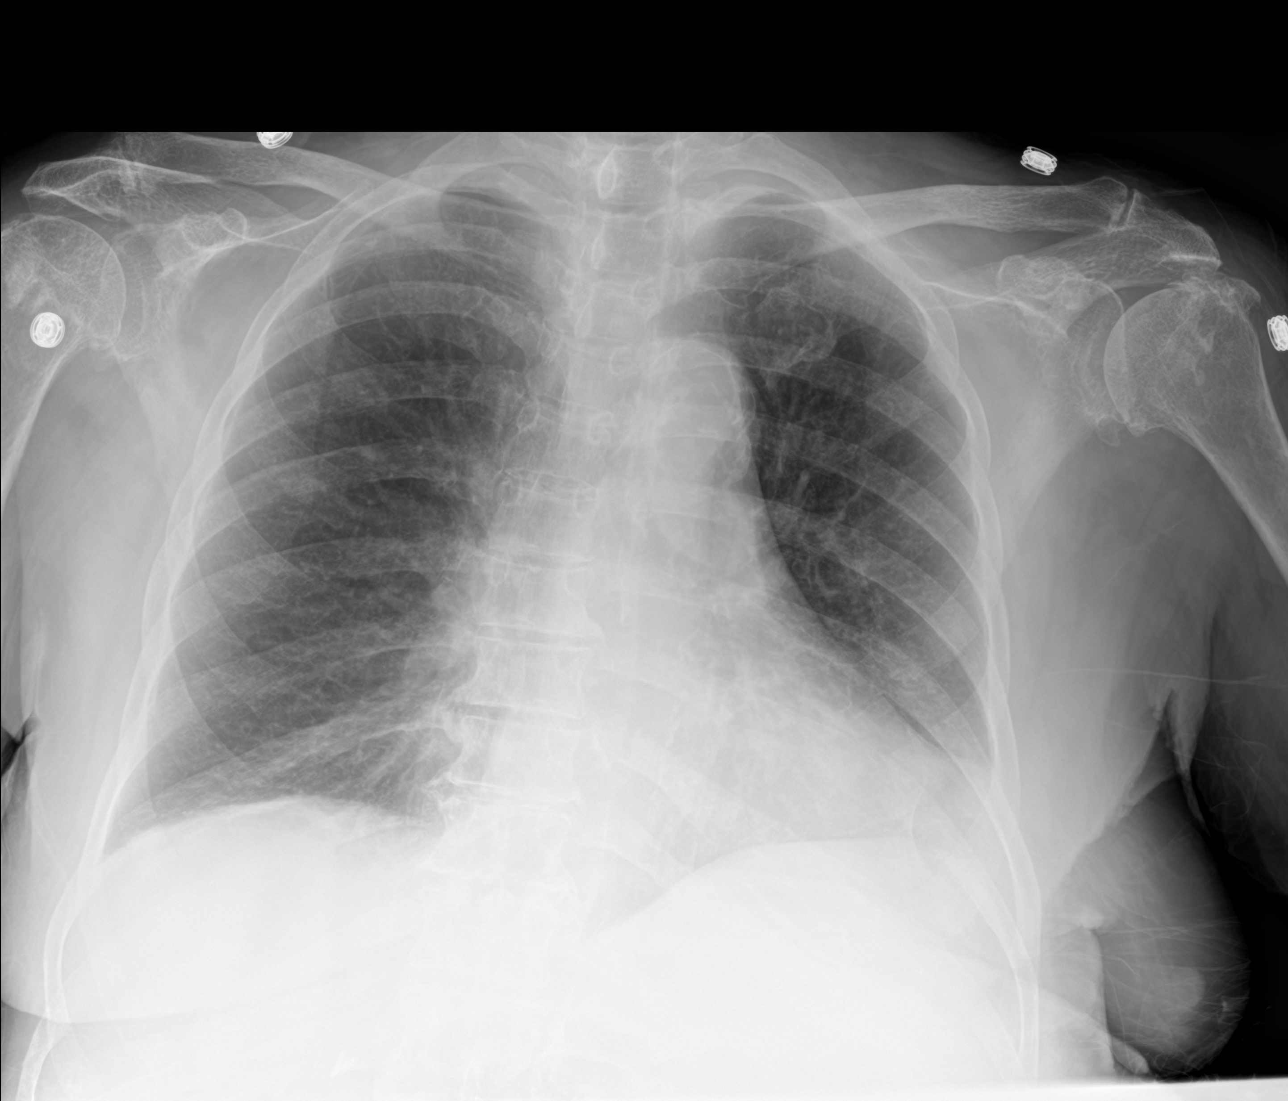

[chest lat (2 of 2)]
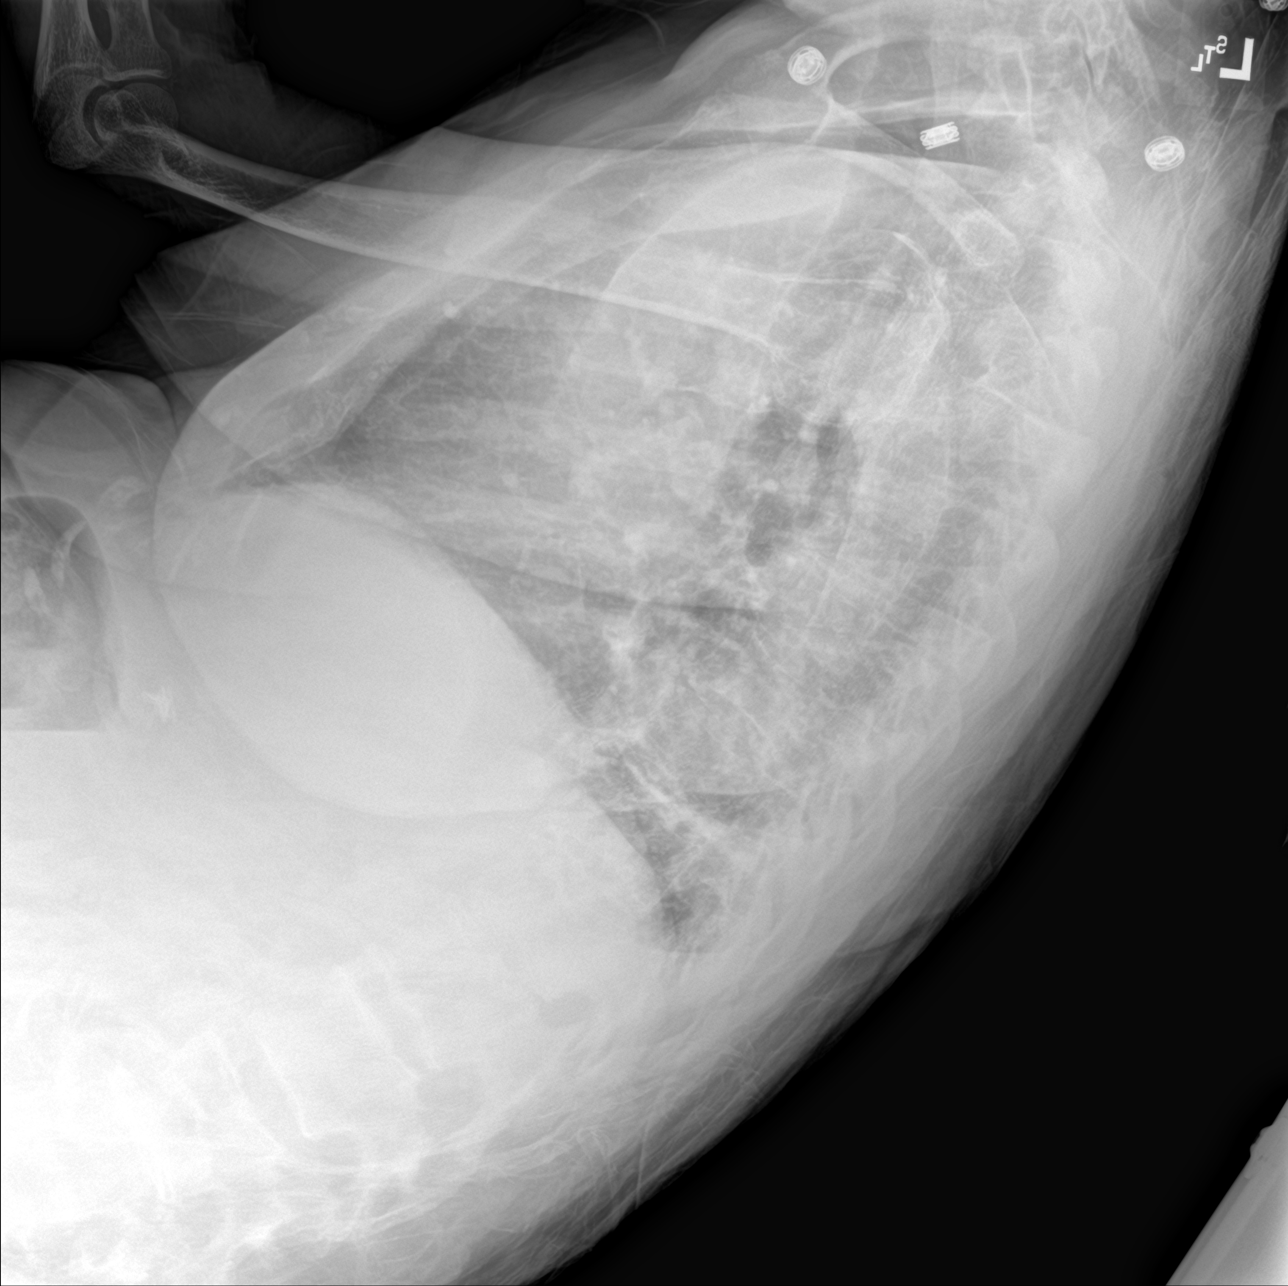

[3 of 3 positions shown; findings below may reference images not displayed]

FINDINGS: Stable normal cardiac silhouette given projection and technique.
Aortic atherosclerosis with calcification. Improved aeration of the
left lung base with mild residual streaky opacity. No new
consolidation, effusion, or pneumothorax. Degenerative changes of
bilateral glenohumeral joints and the thoracic spine. No acute
osseous abnormality is evident.
IMPRESSION: 1. Improved aeration of the left lung base with mild residual
streaky opacity similar to prior radiographs, probably scarring and
atelectasis. No new focal consolidation.
2. Aortic atherosclerosis.

By: Joito Osteen M.D.

## 2019-02-19 IMAGING — CT CT CHEST W/O CM
2 of 4 series · 15 of 36 positions shown, 18 images · non-contrast
Comparison: Chest radiograph 03/20/2017

CLINICAL DATA: COPD.  Possible pneumonia.

EXAM:
CT CHEST WITHOUT CONTRAST
TECHNIQUE: Multidetector CT imaging of the chest was performed following the
standard protocol without IV contrast.

[Series 3: thorax · axial · 0.73mm/px · z∈[+666,+926]mm · 12 of 152 slices shown, 15 images]
[im 11/152  mediastinal]
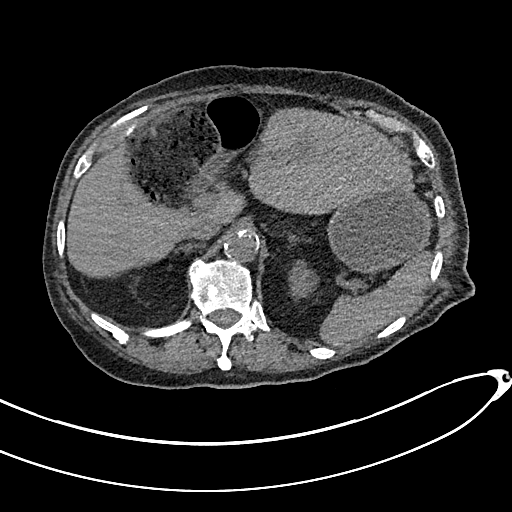
[im 11/152  lung]
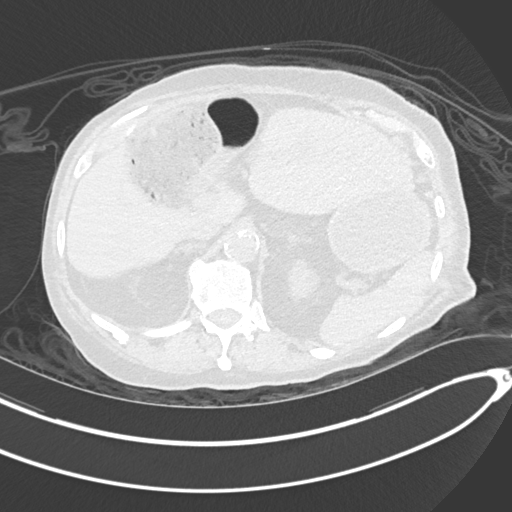
[im 22/152  lung]
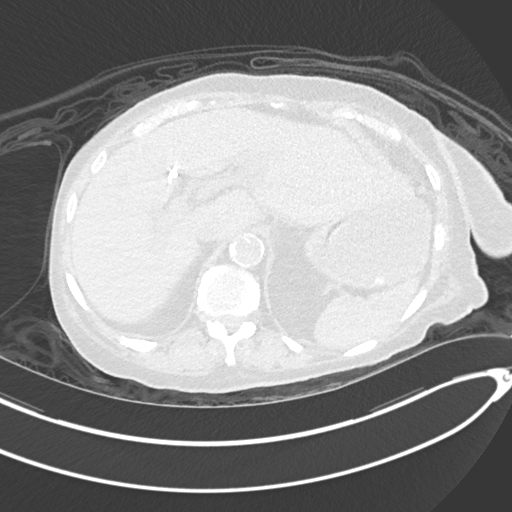
[im 33/152  lung]
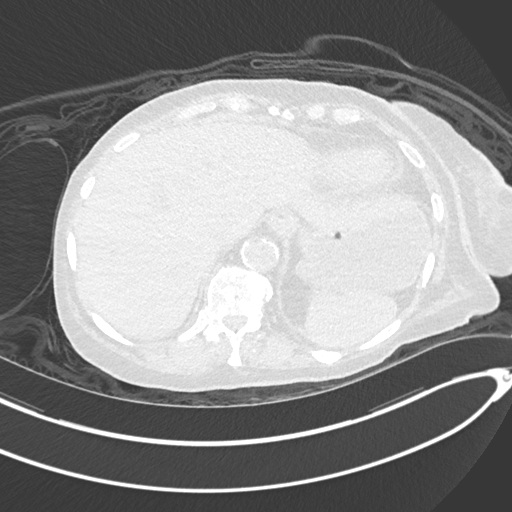
[im 44/152  lung]
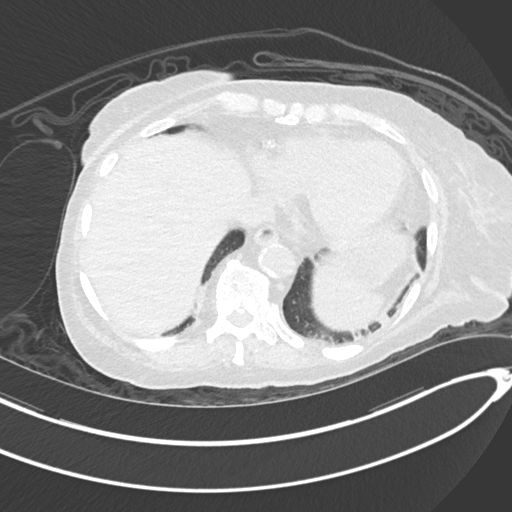
[im 54/152  mediastinal]
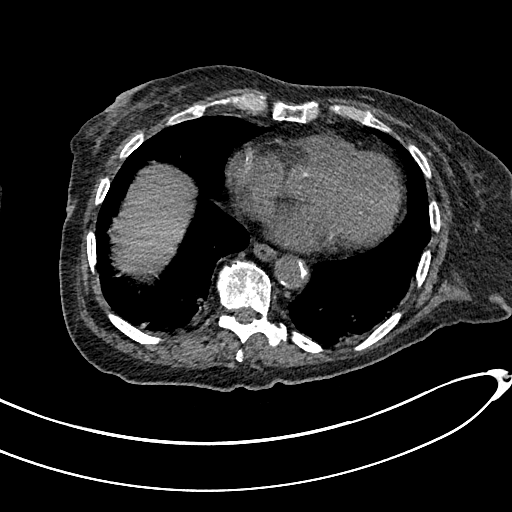
[im 54/152  lung]
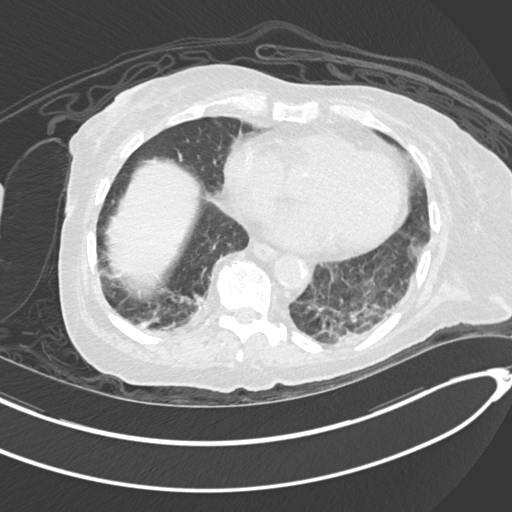
[im 65/152  lung]
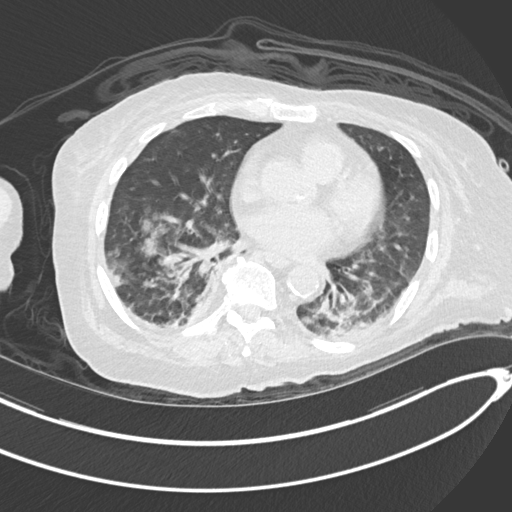
[im 87/152  lung]
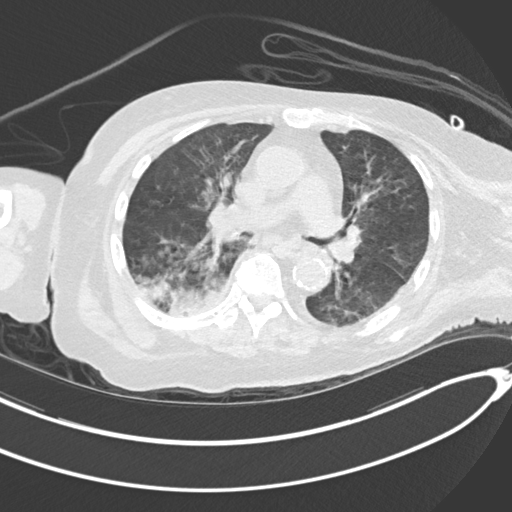
[im 98/152  lung]
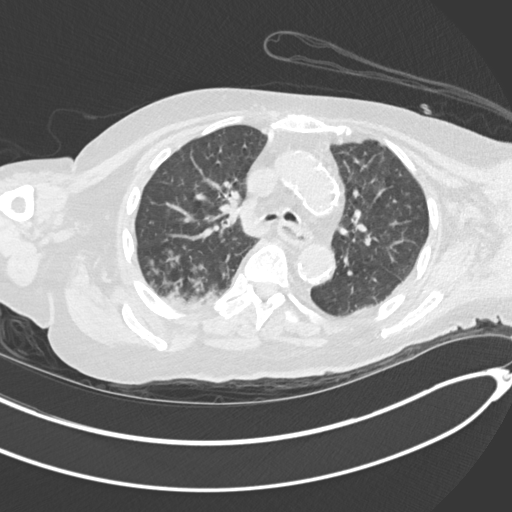
[im 108/152  mediastinal]
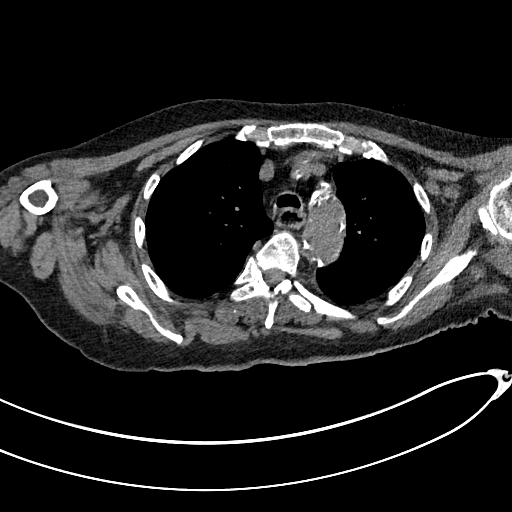
[im 108/152  lung]
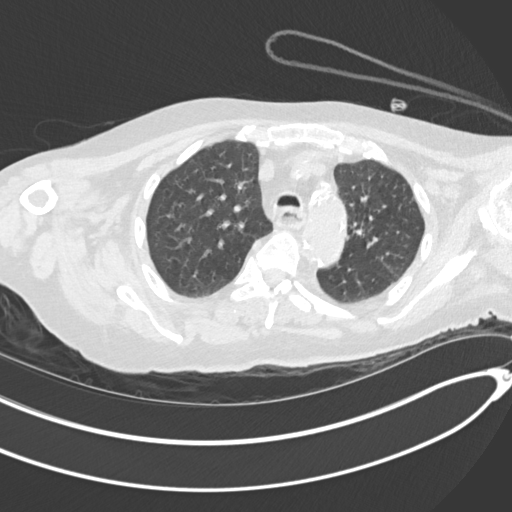
[im 119/152  lung]
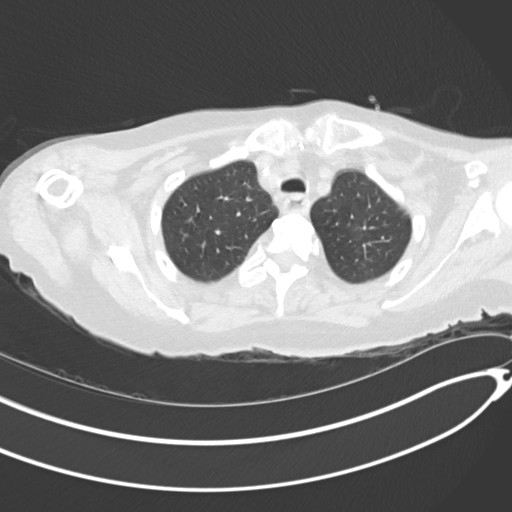
[im 130/152  lung]
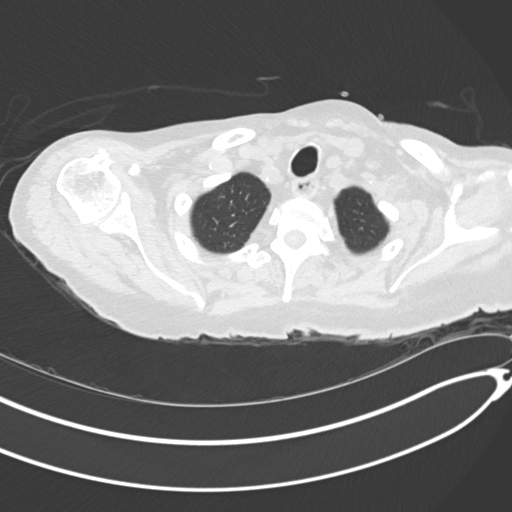
[im 141/152  lung]
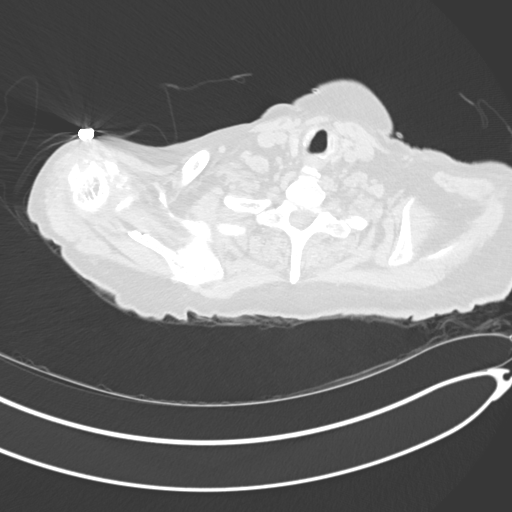

[Series 6: coronal · coronal · 0.62mm/px · 3 of 118 slices shown]
[im 24/118  lung]
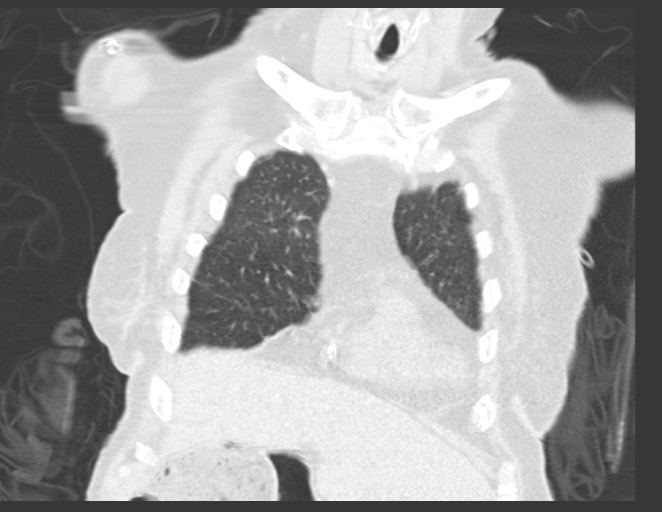
[im 47/118  lung]
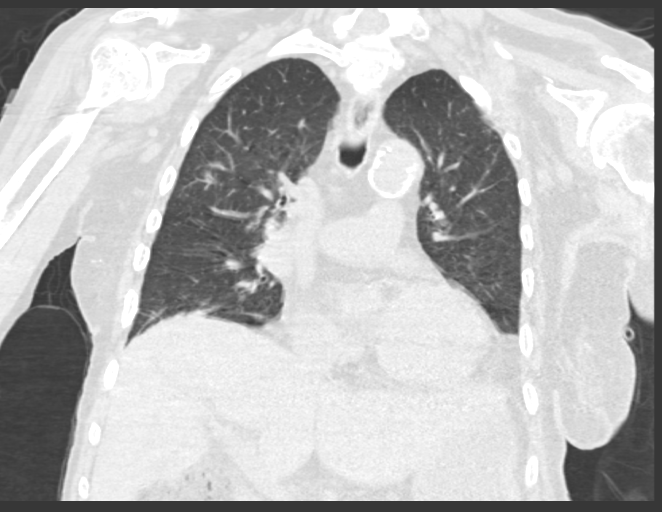
[im 71/118  lung]
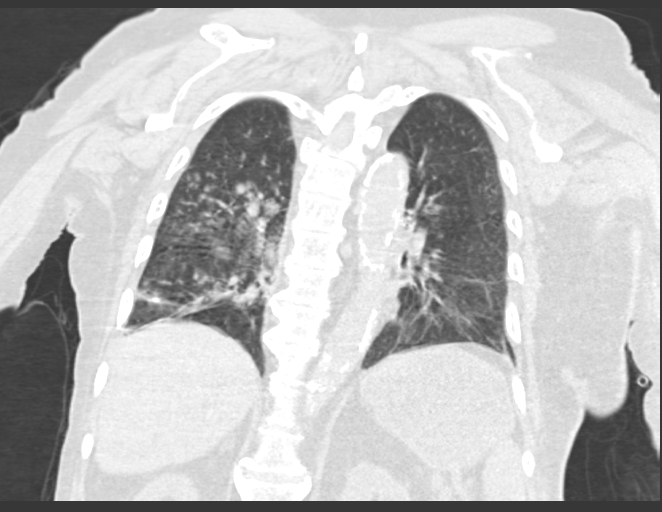

[15 of 36 positions shown; findings below may reference images not displayed]

FINDINGS: Cardiovascular: Normal heart size. No pericardial effusion. Ectasia
and calcific atherosclerotic disease of the thoracic aorta.
Irregularity of the contour of the aorta at the thoracic arch, with
maximum diameter of 2.7 cm. Crescent-shaped calcifications along the
3.

Mediastinum/Nodes: No enlarged mediastinal or axillary lymph nodes.
Thyroid gland, and trachea demonstrate no significant findings.
Debris within the dependent portion of the esophagus.

Lungs/Pleura: Patchy areas of airspace consolidation in the right
greater than left lower lobe in dependent distribution. Smaller
areas of airspace consolidation extend into the bilateral upper
lobes.

Upper Abdomen: No acute abnormality.

Musculoskeletal: Apparent interruption of the right proximal humeral
cortex may be artifactual. Multilevel osteoarthritic changes of the
thoracic spine.
IMPRESSION: Ectasia and calcific atherosclerotic disease of the visualized
aorta. Irregularity of the aortic contour at the thoracic arch, and
intraluminal calcifications at the thoracic arch and at the level of
the proximal abdominal aorta may represent rim calcified
atherosclerotic plaque versus dissection of the aorta, incompletely
evaluated due to lack of IV contrast.

Bilateral lower lobe predominant pneumonia with dependent
distribution. Query aspiration pneumonia, especially given debris
within the esophagus.

Apparent interruption of the cortex of the right proximal humerus,
which may be due to motion artifact rather than representing a true
fracture. Please correlate to possible point of tenderness.

These results will be called to the ordering clinician or
representative by the Radiologist Assistant, and communication
documented in the PACS or zVision Dashboard.

## 2019-02-22 IMAGING — RF DG ARTHROGRAM HIP*R*
1 series · 1 of 1 positions shown · non-contrast
Comparison: CT of the right hip from 03/15/2017

INDICATION: Right hip pain.  Right hip arthropathy.

EXAM:
THERAPEUTIC INJECTION OF RIGHT HIP JOINT UNDER FLUOROSCOPIC GUIDANCE

[Series 1: run · 1 of 1 slices shown]
[im 1/1]
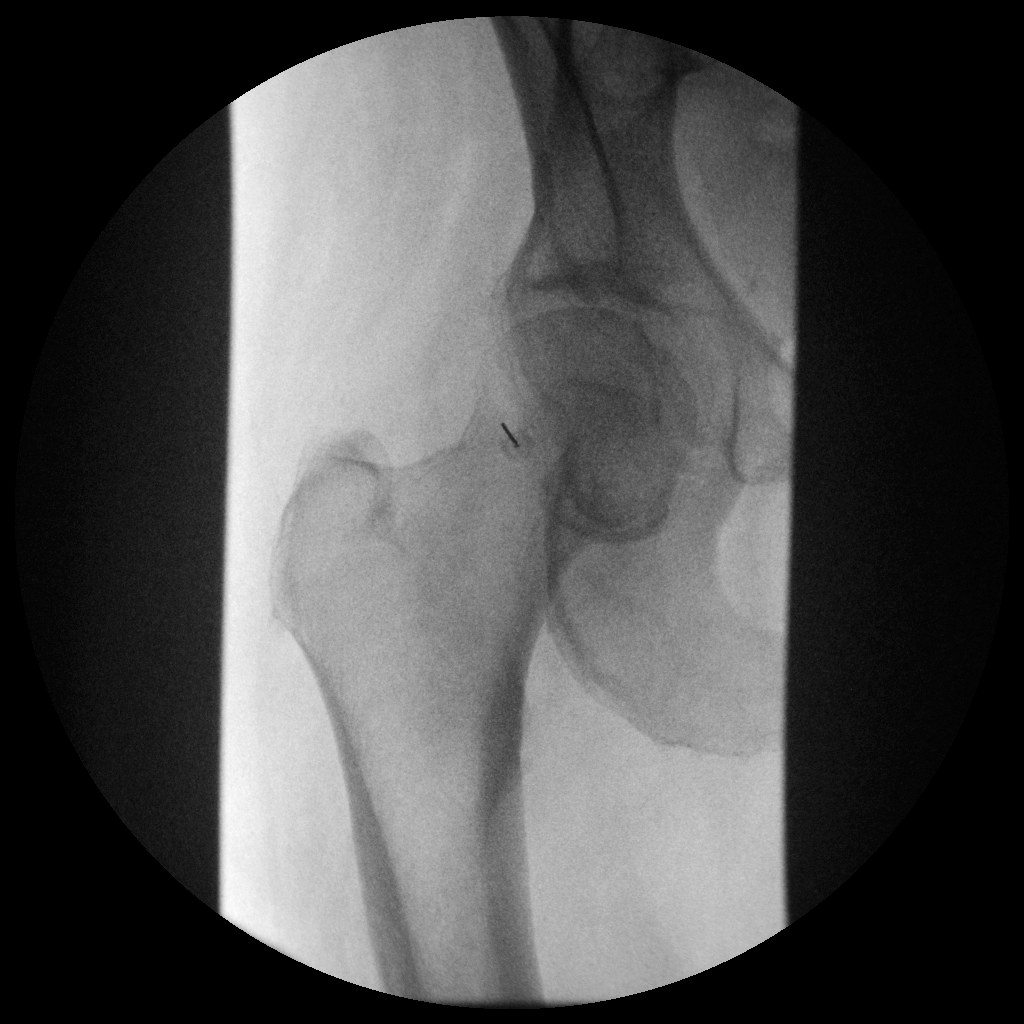

[1 of 1 positions shown; findings below may reference images not displayed]

FLUOROSCOPY TIME:  Fluoroscopy Time:  0 minutes, 14 seconds

Radiation Exposure Index (if provided by the fluoroscopic device):
1.6 mGy

Number of Acquired Spot Images: 0

COMPLICATIONS:
None immediate.

PROCEDURE:
I discussed the risks (including hemorrhage, infection, and allergic
reaction, among others), benefits, and alternatives to the procedure
with the patient's daughter, who is currently making decisions for
the patient due to altered mental status. We specifically discussed
the high technical likelihood of success of the procedure. We
discussed the elevated risk of bleeding complications due to a
recent dose of Lovenox, although I still feel that the procedure is
very safe given the planned needle size. The patient's daughter
understood and elected for the patient to undergo the procedure.

Standard time-out was employed. Following sterile skin prep and
local anesthetic administration consisting of 1% lidocaine, a 22
gauge needle was advanced without difficulty into the right hip
joint under fluoroscopic guidance. 6 cc of bupivacaine mixed with 80
mg of Depo-Medrol was injected into the joint without difficulty.
The needle was subsequently removed and the skin cleansed and
bandaged. No immediate complications were observed.
IMPRESSION: Successful fluoroscopic guided therapeutic injection of the right
hip.

## 2019-02-22 IMAGING — CT CT NECK W/O CM
3 of 4 series · 15 of 34 positions shown, 18 images · non-contrast
Comparison: Head CT 03/21/2017. Brain MRI 01/25/2017. Cervical
spine CT 10/19/2016. Chest CT 03/21/2017.

CLINICAL DATA: 76 y/o female with palpable mass right tonsillar
area. Afebrile

EXAM:
CT NECK WITHOUT CONTRAST
TECHNIQUE: Multidetector CT imaging of the neck was performed following the
standard protocol without intravenous contrast.

[Series 3: axial neck · axial · 0.43mm/px · z∈[+1021,+1215]mm · 7 of 125 slices shown, 9 images]
[im 14/125  soft-tissue]
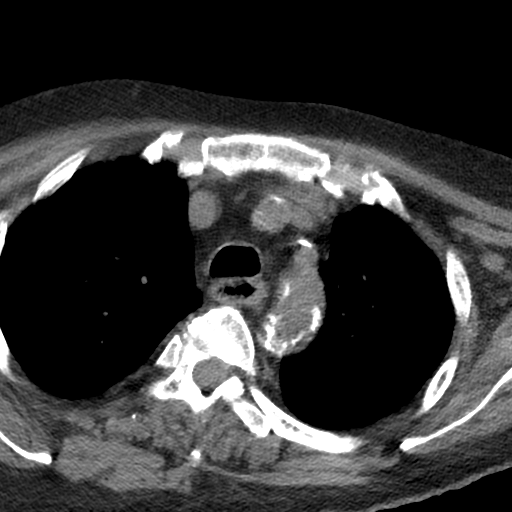
[im 14/125  bone]
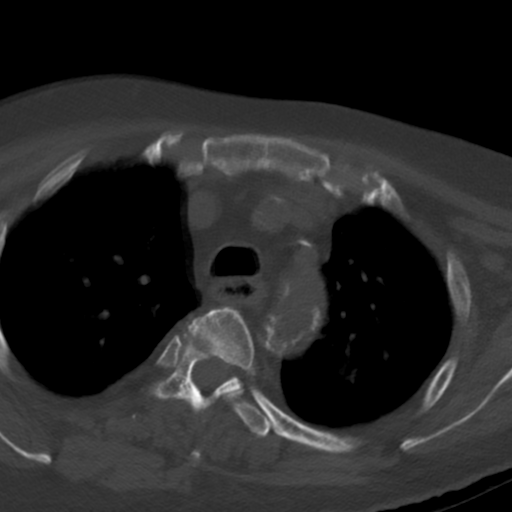
[im 28/125  bone]
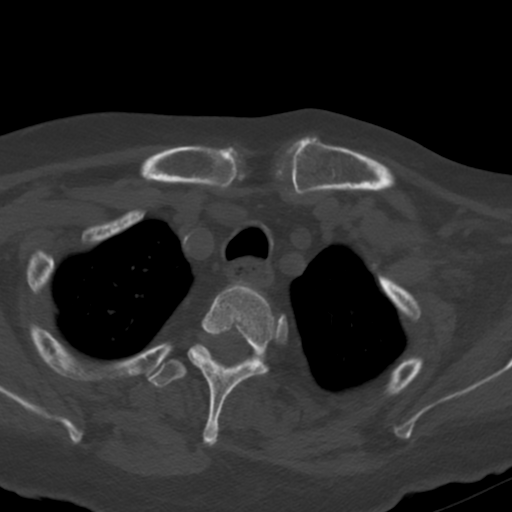
[im 42/125  bone]
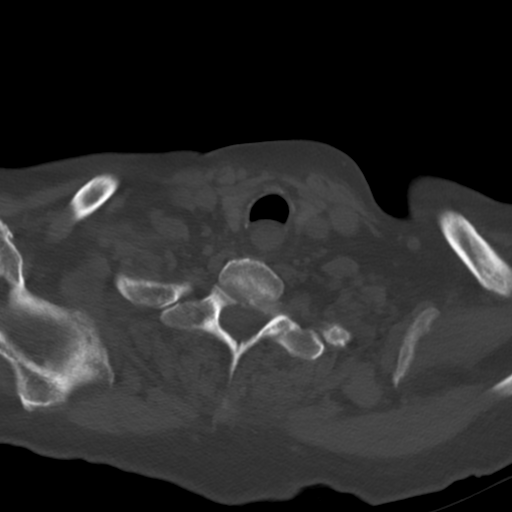
[im 69/125  bone]
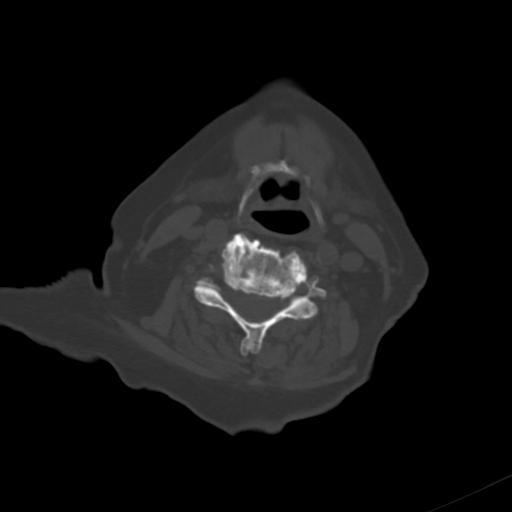
[im 83/125  soft-tissue]
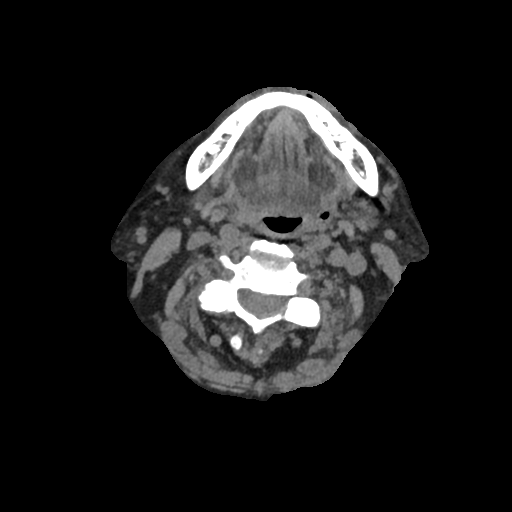
[im 83/125  bone]
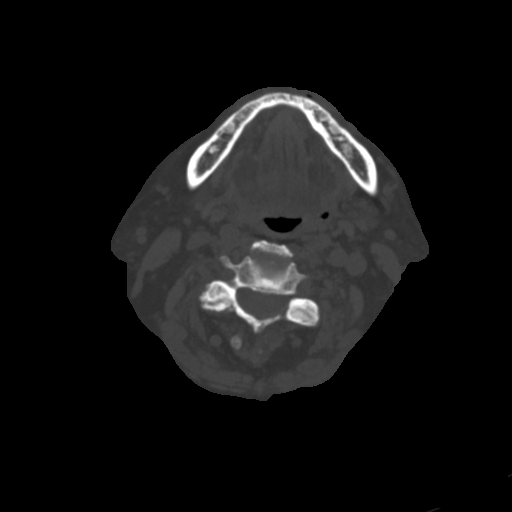
[im 97/125  bone]
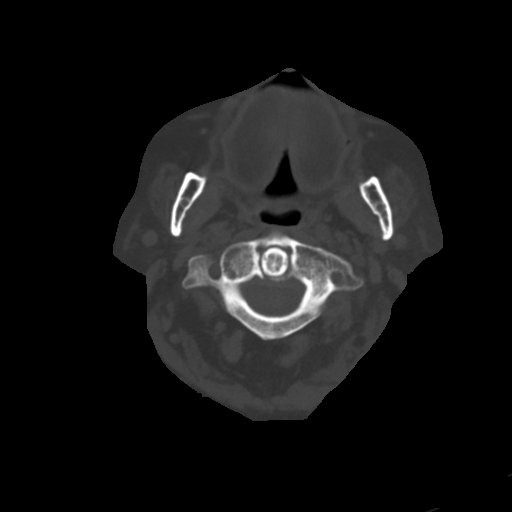
[im 111/125  bone]
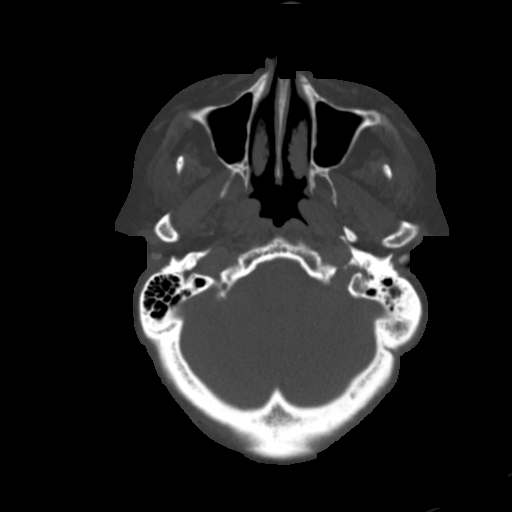

[Series 8: cor neck · coronal · 0.46mm/px · 3 of 90 slices shown]
[im 18/90  bone]
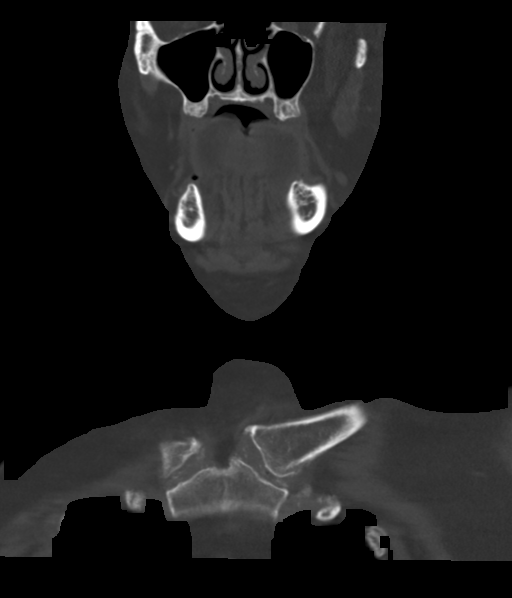
[im 36/90  bone]
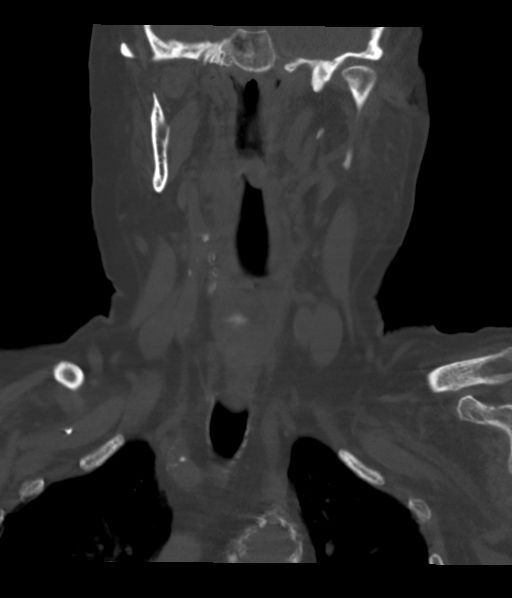
[im 54/90  bone]
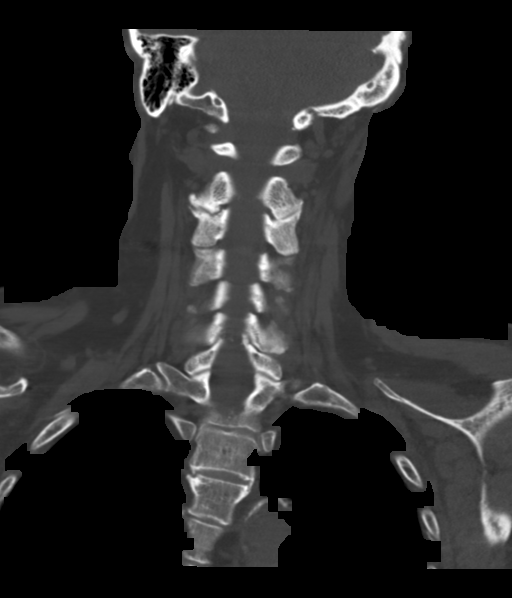

[Series 9: sag neck · sagittal · 0.52mm/px · 5 of 112 slices shown, 6 images]
[im 38/112  bone]
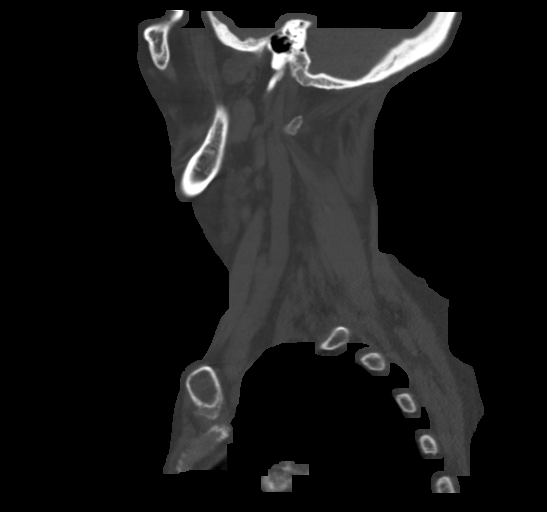
[im 47/112  bone]
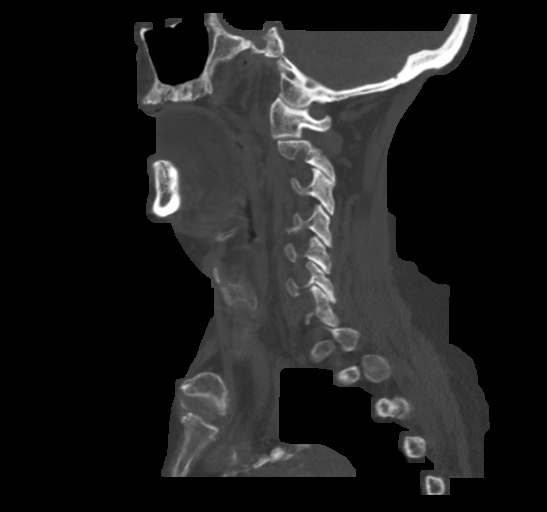
[im 56/112  soft-tissue]
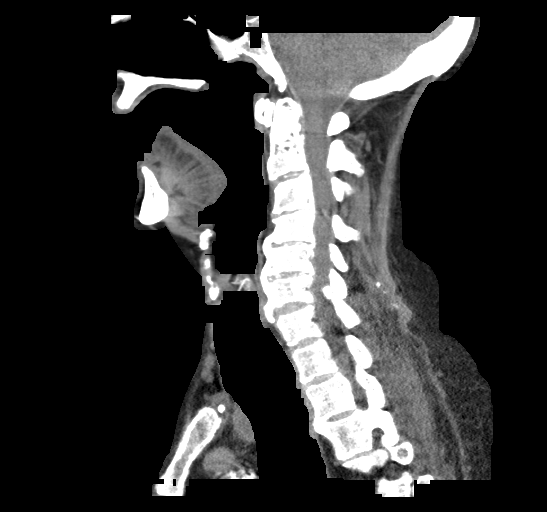
[im 56/112  bone]
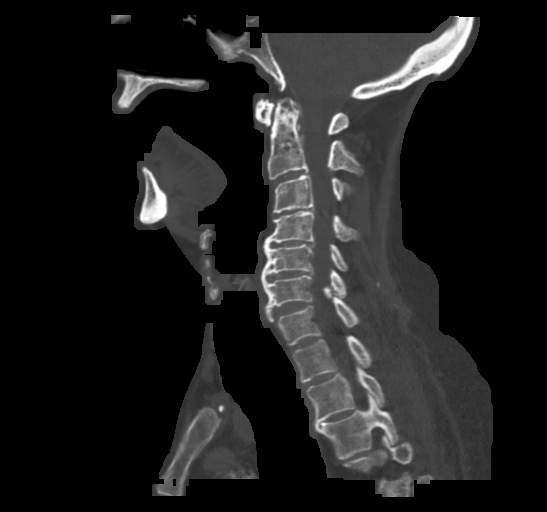
[im 65/112  bone]
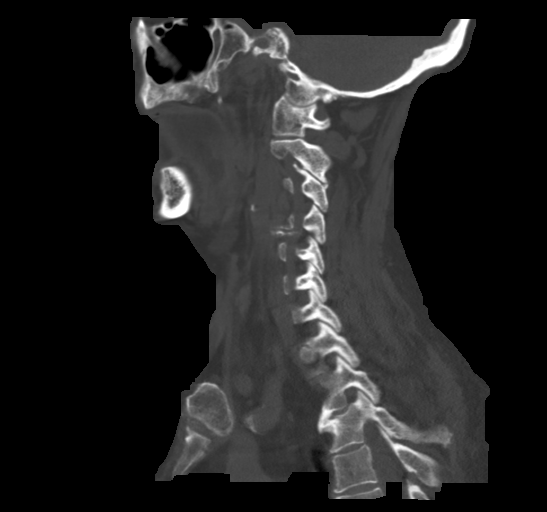
[im 75/112  bone]
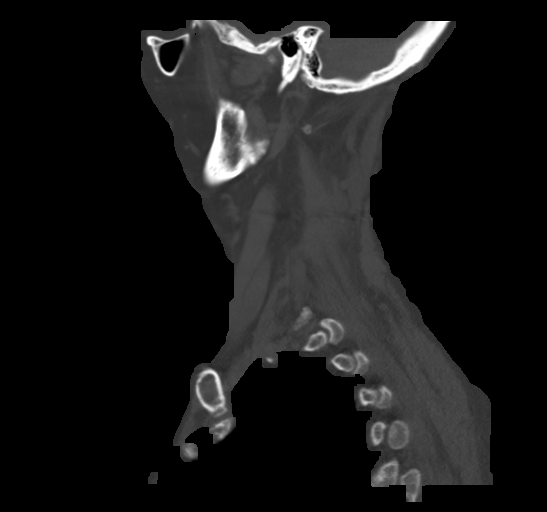

[15 of 34 positions shown; findings below may reference images not displayed]

FINDINGS: Pharynx and larynx: Negative larynx. Mild motion artifact at the
hypopharynx, but pharyngeal soft tissue contours appear stable [REDACTED] and within normal limits. Negative parapharyngeal and
retropharyngeal spaces.

Salivary glands: Negative noncontrast sublingual space,
submandibular and parotid glands.

Thyroid: Diminutive, negative.

Lymph nodes: Negative.  No cervical lymphadenopathy.

Vascular: Vascular patency is not evaluated in the absence of IV
contrast. Calcified aortic atherosclerosis. Mild calcified carotid
bifurcation atherosclerosis. Mild Calcified atherosclerosis at the
skull base.

Limited intracranial: Stable and negative.

Visualized orbits: Negative.

Mastoids and visualized paranasal sinuses: Stable mild left mastoid
effusion. Other visible paranasal sinuses and mastoids are stable
and well pneumatized.

Skeleton: Absent dentition. Chronic cervical spine degeneration. No
acute osseous abnormality identified.

Upper chest: Partially visible patchy peribronchial opacity in the
right lung as seen on the 03/21/2017 chest CT. Stable lung apices.
Stable visualized superior mediastinum.
IMPRESSION: 1. Negative for neck mass or lymphadenopathy. Negative noncontrast
CT appearance of the neck soft tissues.
2. Continued patchy peribronchial opacity in the right upper lobe as
seen on the Chest CT 03/21/2017.
3.  Aortic Atherosclerosis ([id number]-RM7.7).

## 2019-03-25 IMAGING — DX DG CHEST 1V PORT
1 series · 1 of 1 positions shown · non-contrast
Comparison: CT of the chest 03/21/2017

CLINICAL DATA: Altered mental status.

EXAM:
PORTABLE CHEST 1 VIEW

[chest]
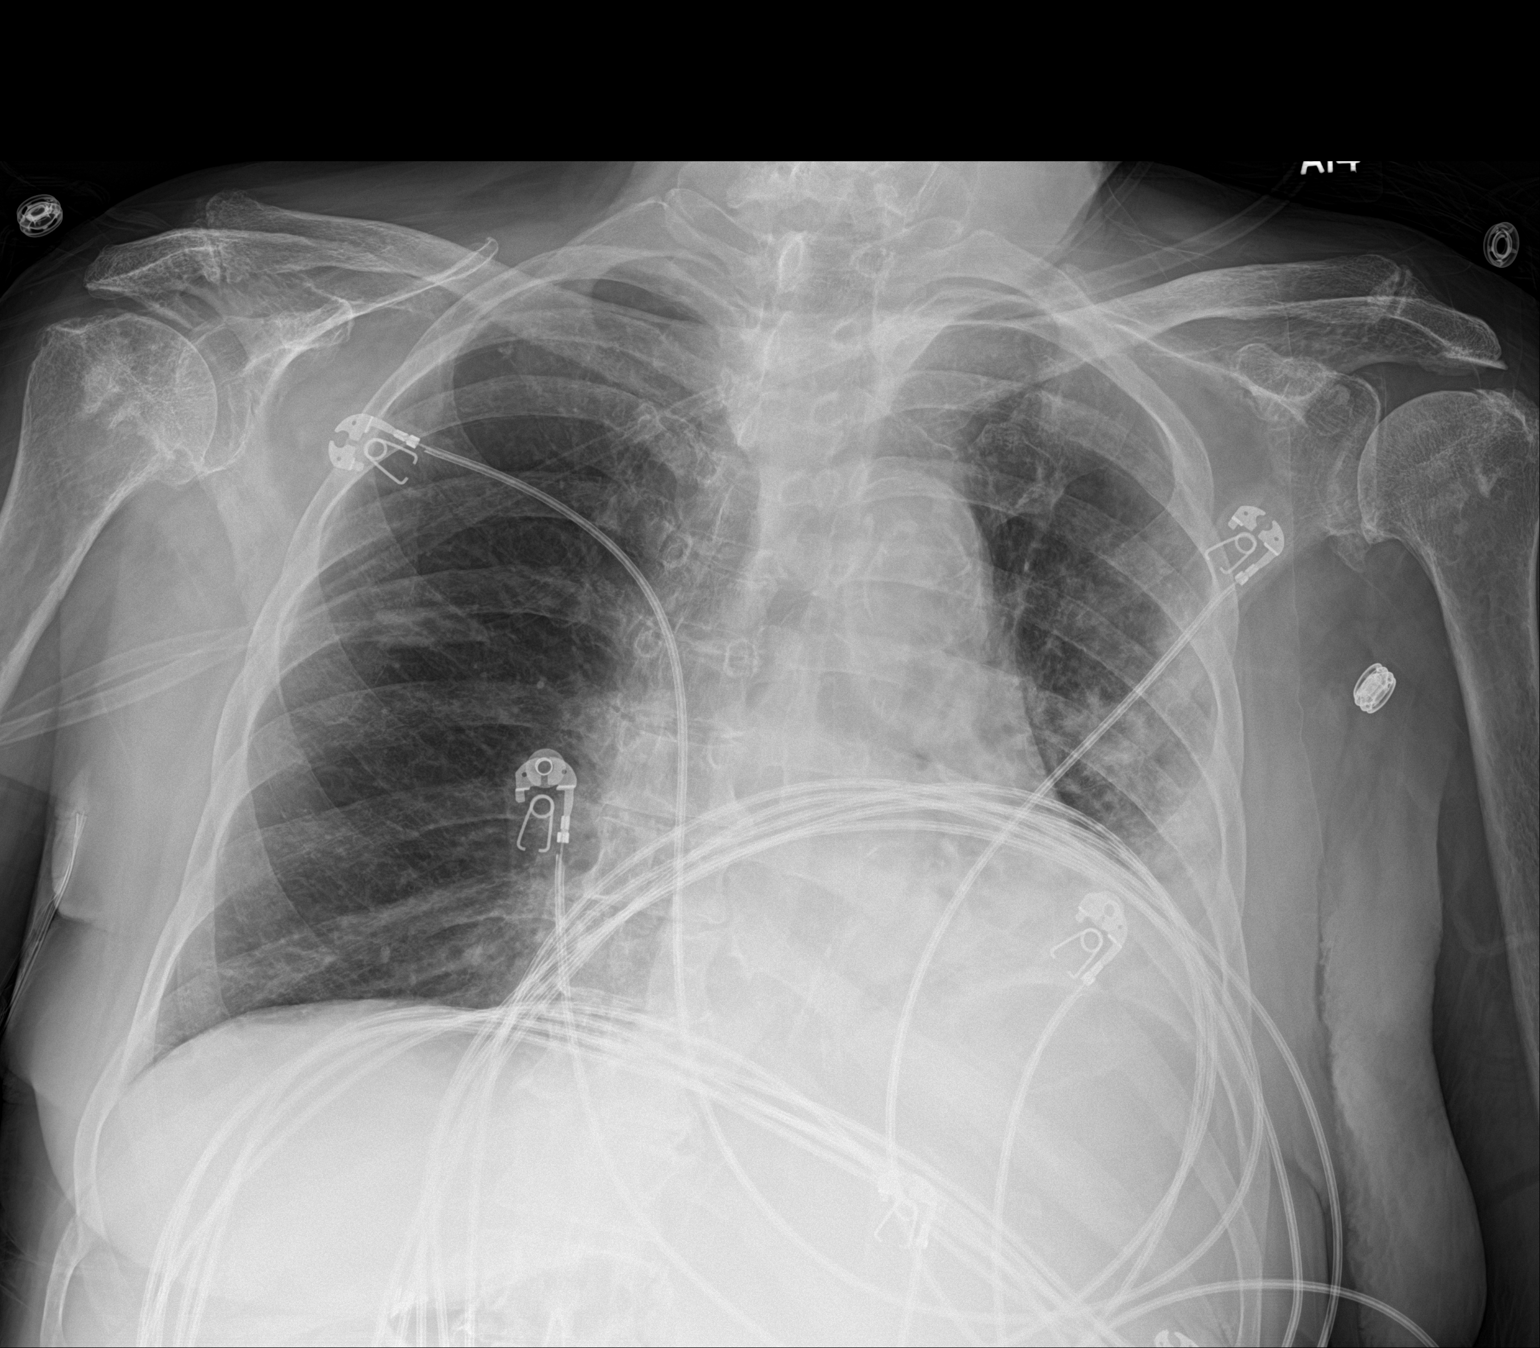

[1 of 1 positions shown; findings below may reference images not displayed]

FINDINGS: Enlarged cardiac silhouette. Calcific atherosclerotic disease and
tortuosity of the aorta.

Left lower lobe airspace consolidation and moderate left pleural
effusion. Low lung volumes.

Osseous structures are without acute abnormality. Soft tissues are
grossly normal.
IMPRESSION: Left lower lobe airspace consolidation/ pleural effusion.

## 2019-03-28 IMAGING — DX DG ABD PORTABLE 1V
2 series · 2 of 2 positions shown · non-contrast
Comparison: Scout image for CT scan of the abdomen dated 01/16/2017

CLINICAL DATA: Vomiting.

EXAM:
PORTABLE ABDOMEN - 1 VIEW

[abdomen kub (1 of 2)]
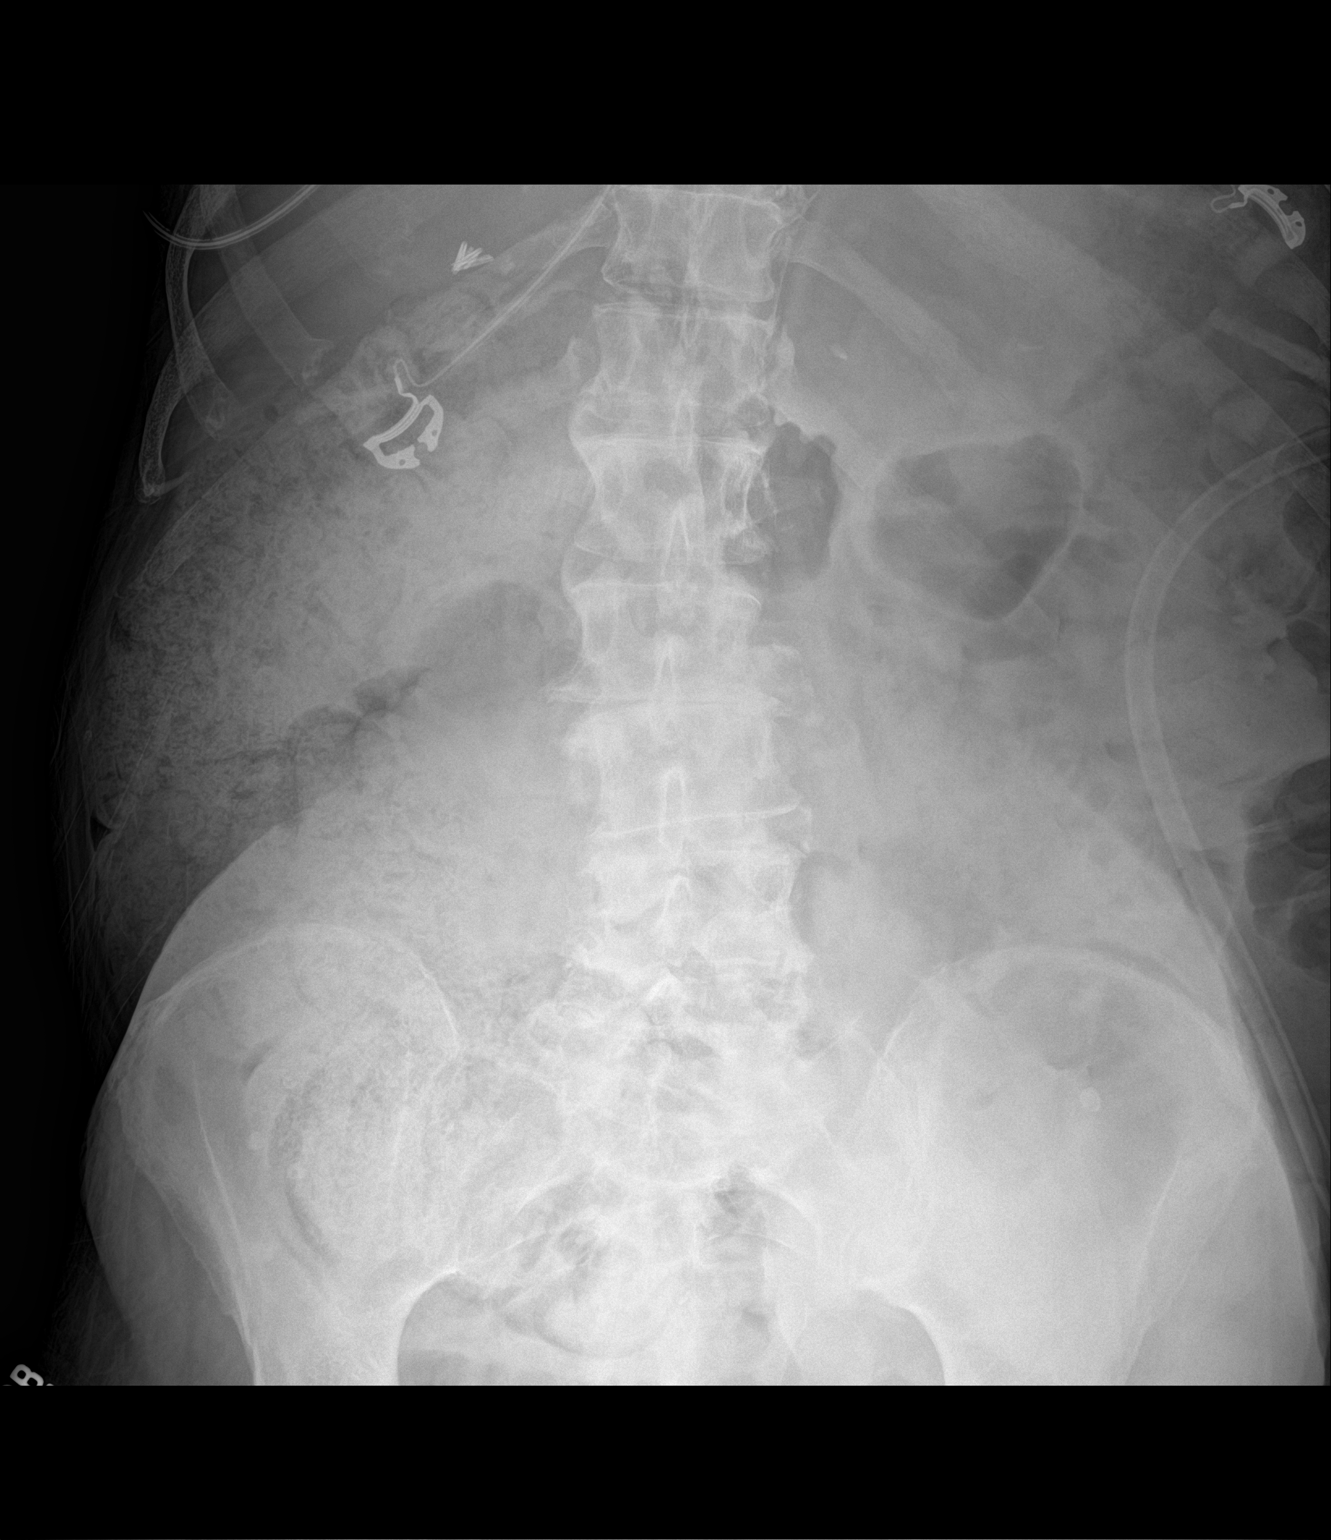

[abdomen kub (2 of 2)]
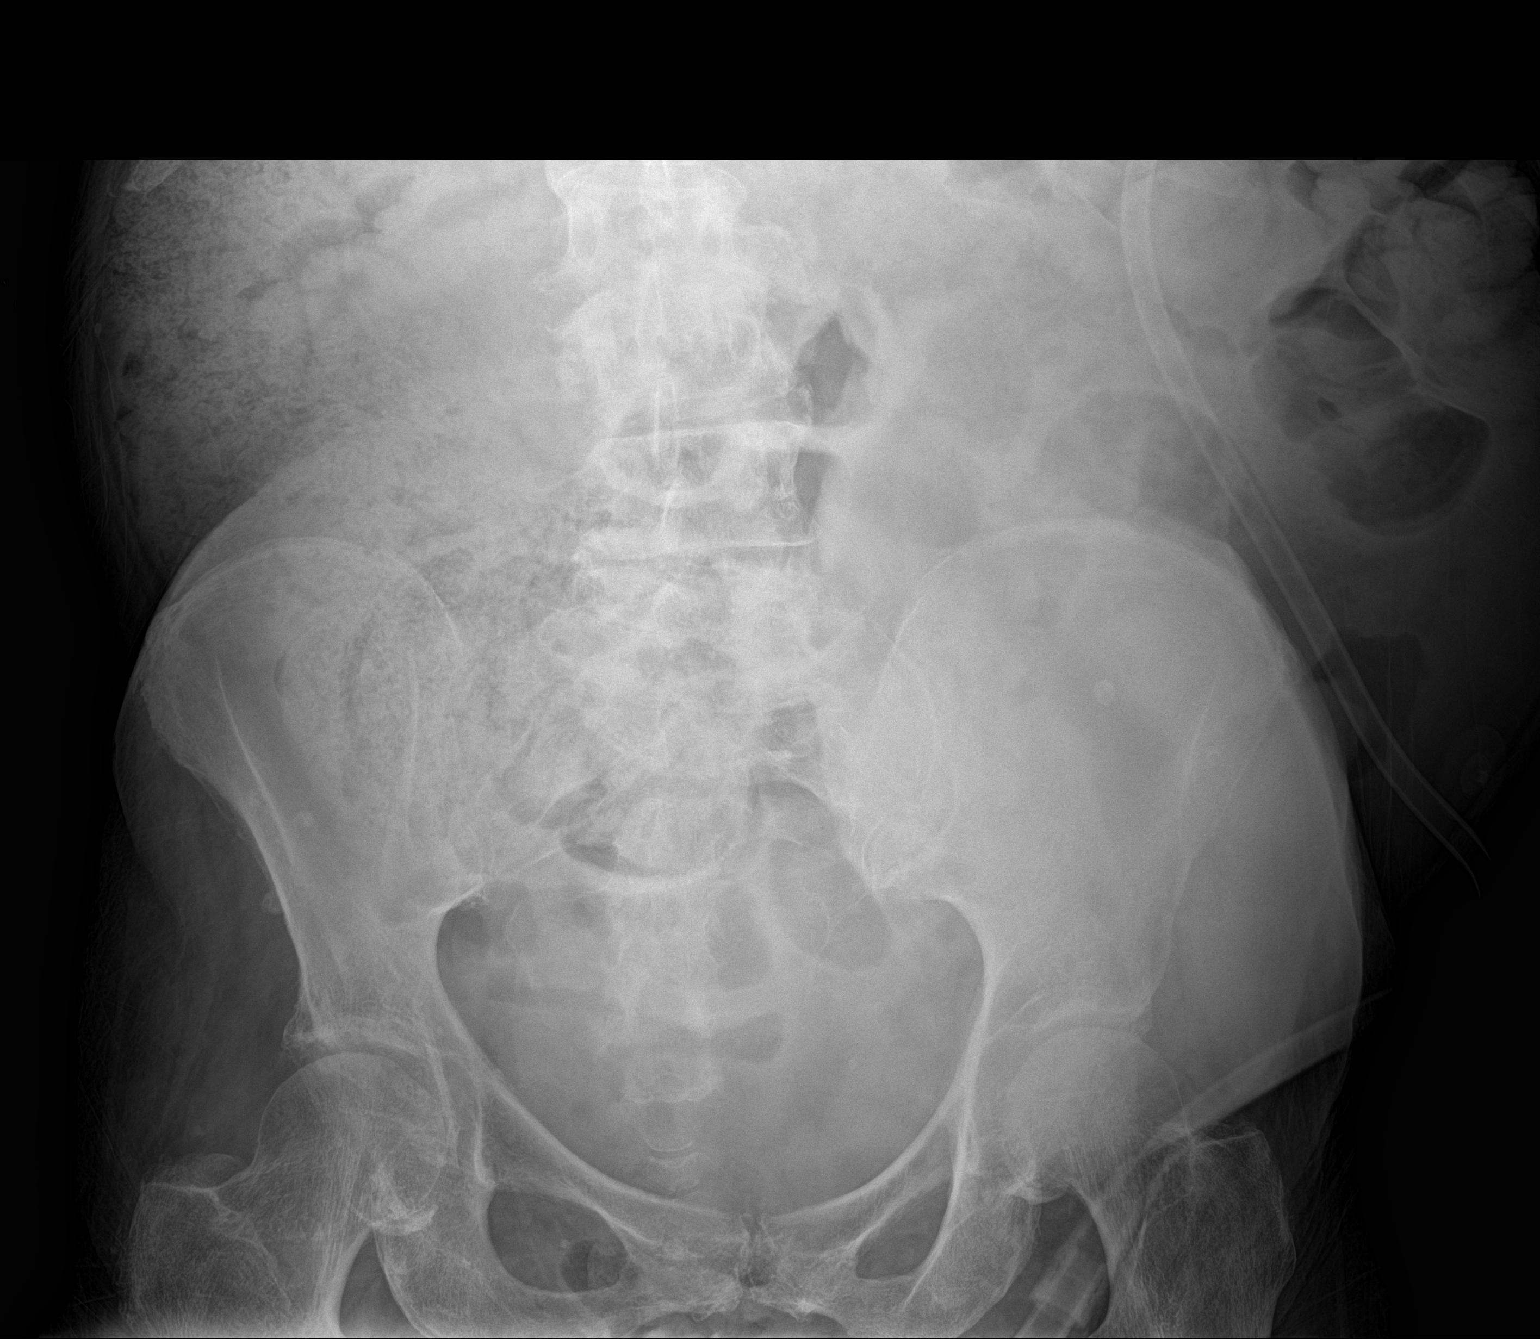

[2 of 2 positions shown; findings below may reference images not displayed]

FINDINGS: There are no dilated loops of large or small bowel. The stomach is
not distended. There is extensive stool in the right side of the
colon with less prominent stool in the proximal descending colon. No
evidence of fecal impaction.

Chronic degenerative changes in the lumbar spine.
IMPRESSION: No acute abnormality. Moderate stool in the colon, but less
prominent than on the prior CT scan.

## 2019-03-28 IMAGING — RF DG SWALLOWING FUNCTION - [facility]PT [facility]
1 series · 18 of 24 positions shown · non-contrast
Comparison: none

[Series 1: run · 21 acquisitions, 18 frames shown]
[im 1/21]
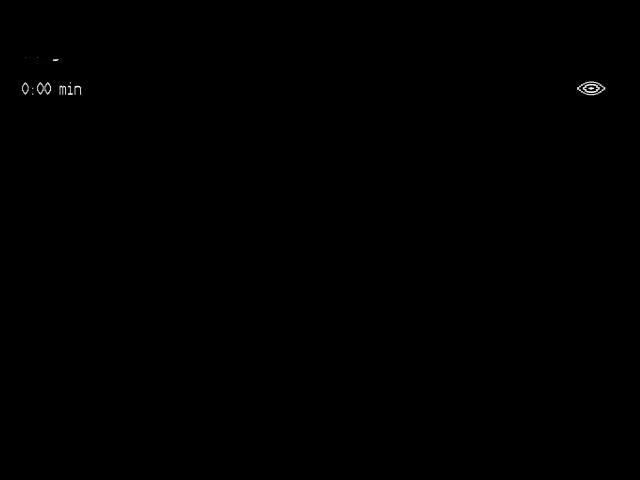
[im 2/21]
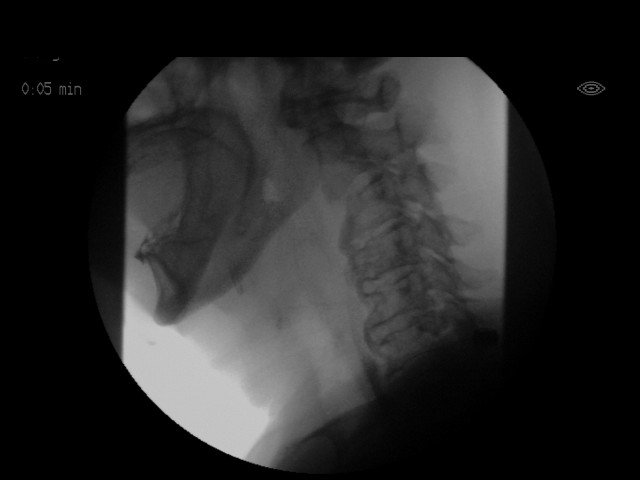
[im 3/21]
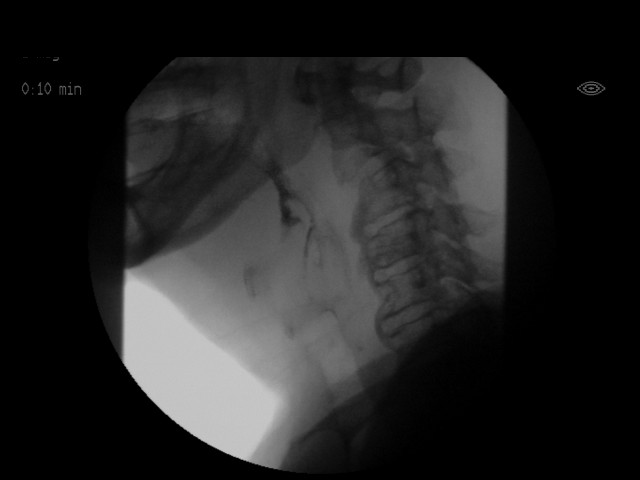
[im 4/21]
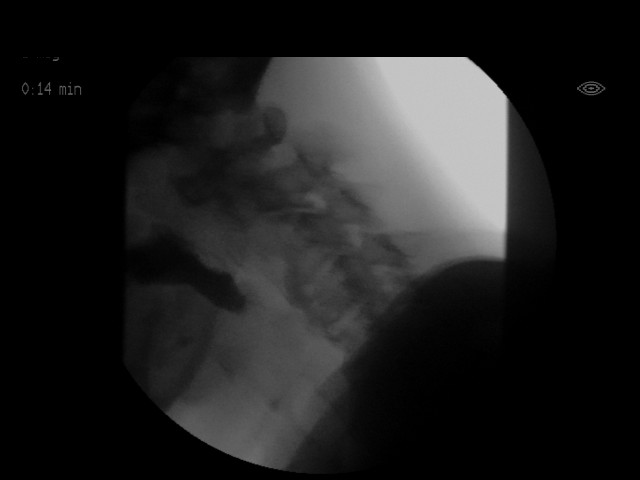
[im 6/21]
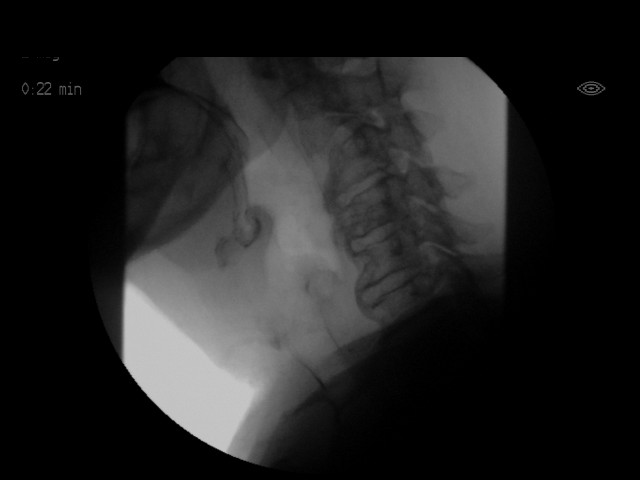
[im 7/21]
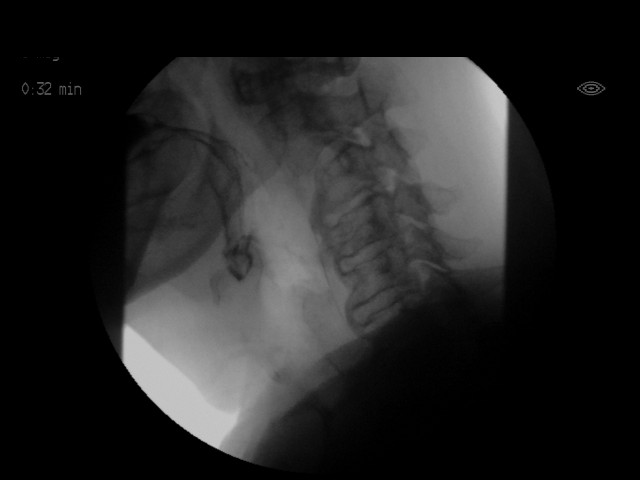
[im 8/21]
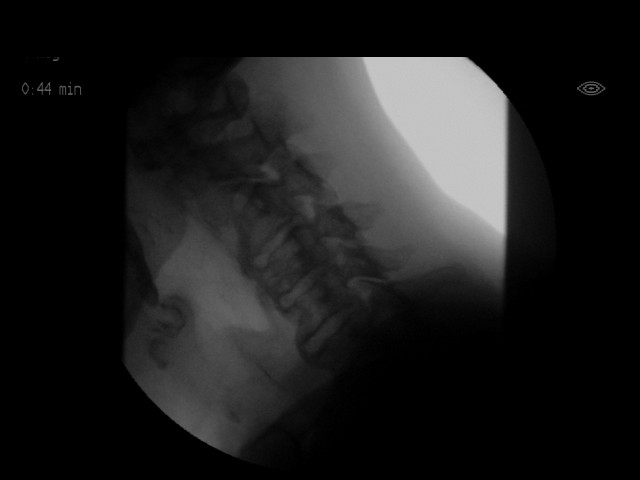
[im 10/21]
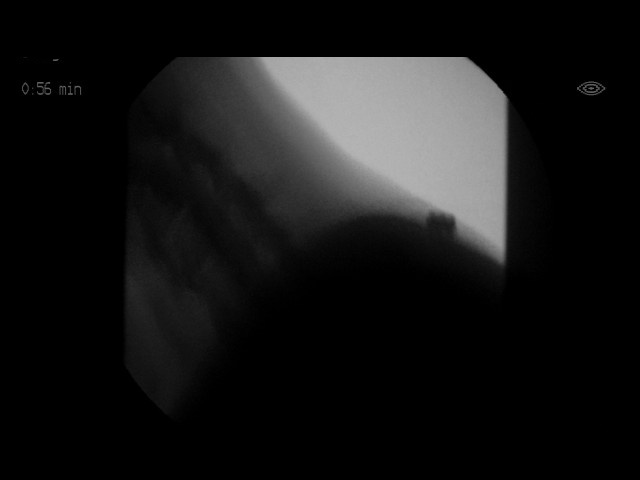
[im 10/21]
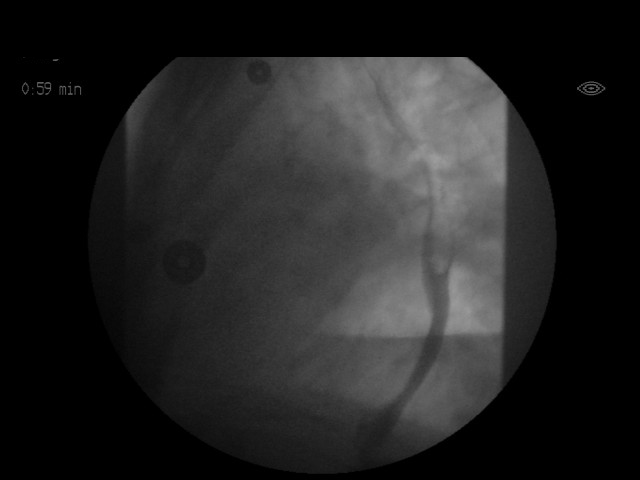
[im 11/21]
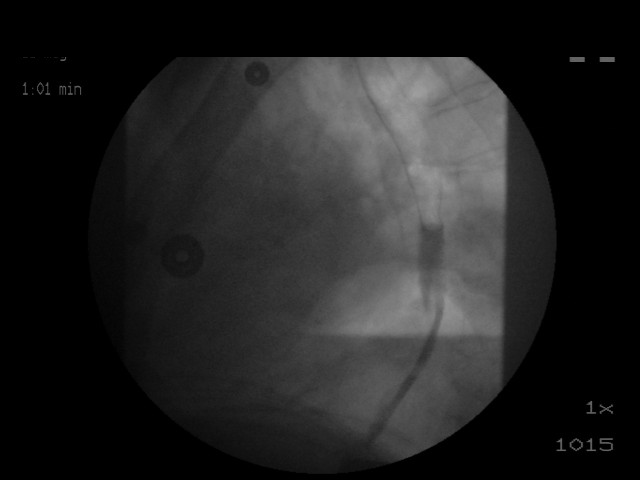
[im 13/21]
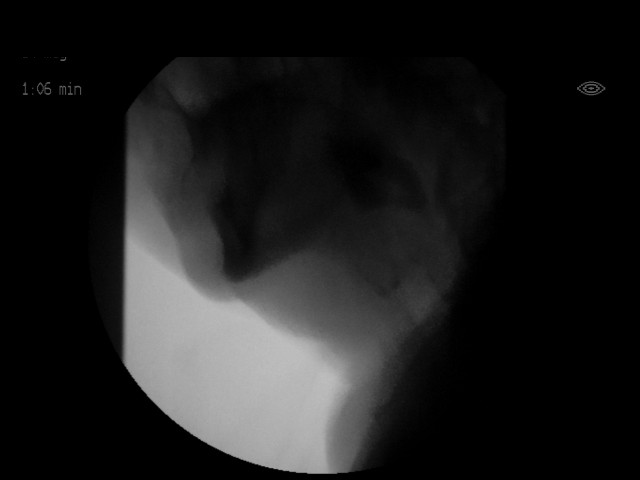
[im 14/21]
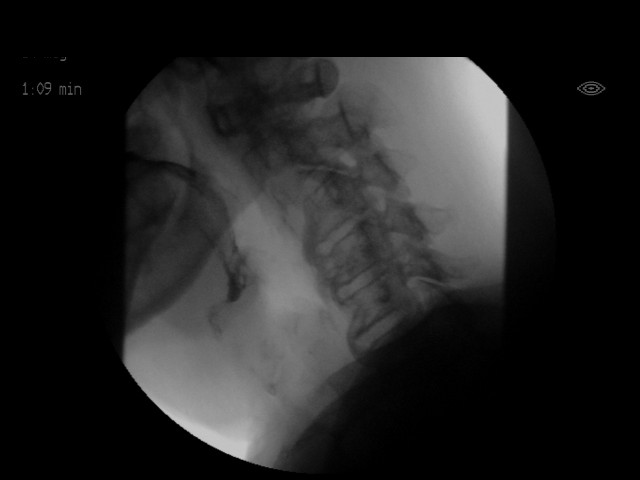
[im 15/21]
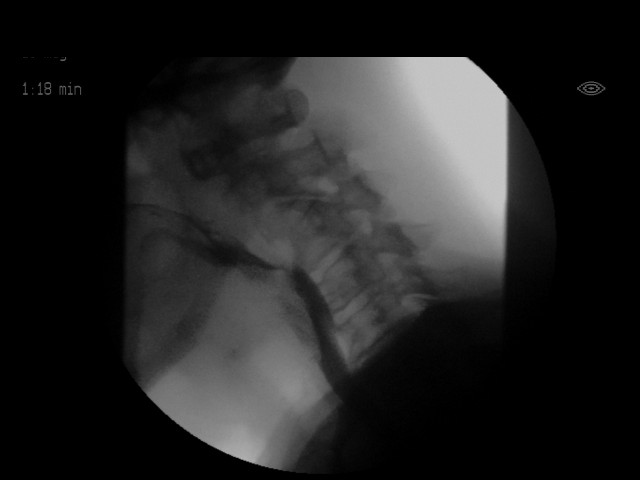
[im 17/21]
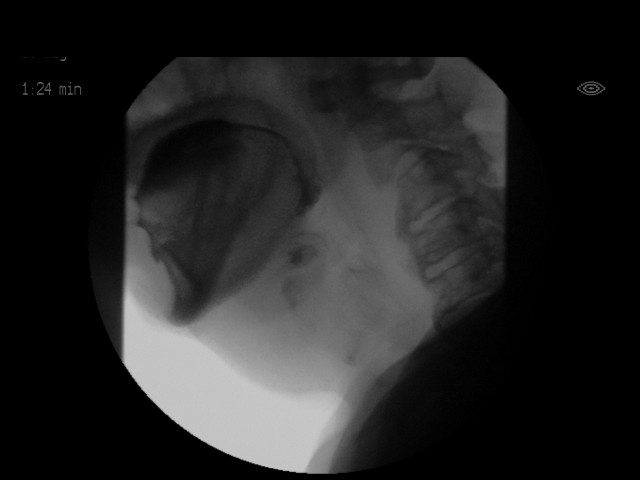
[im 18/21]
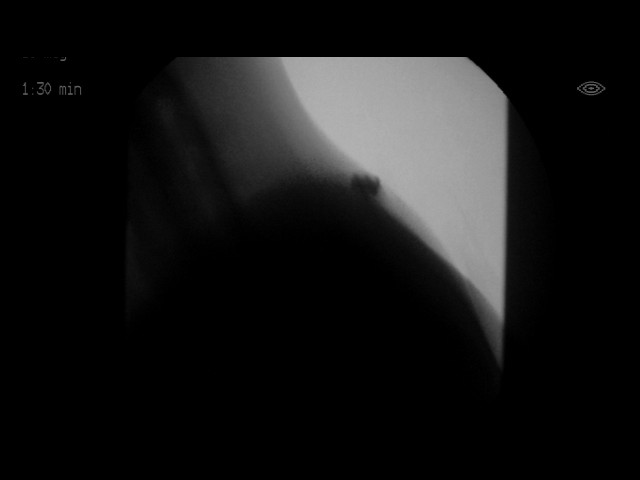
[im 19/21]
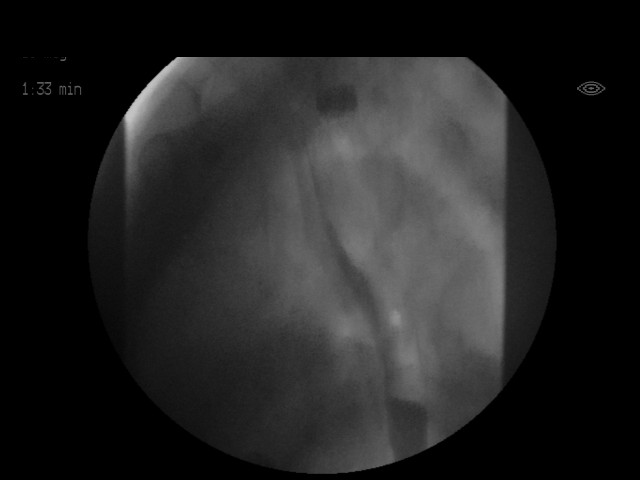
[im 20/21]
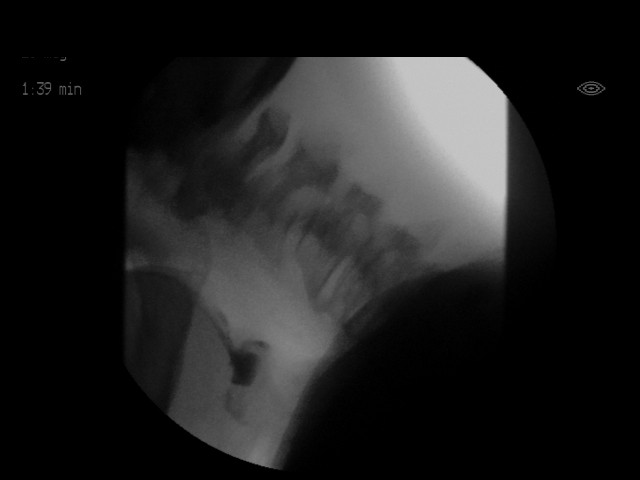
[im 21/21]
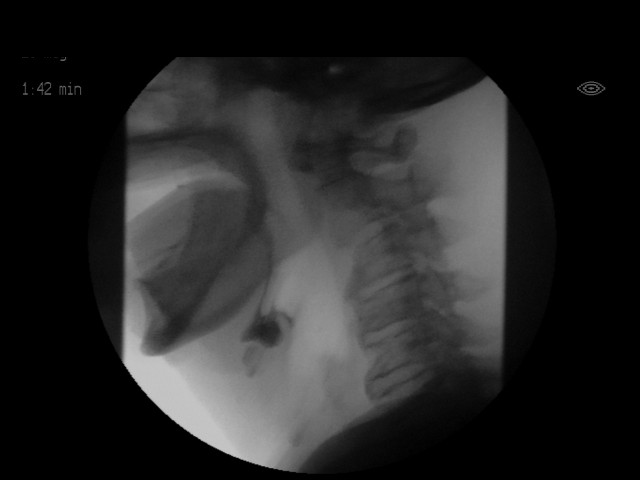

[18 of 24 positions shown; findings below may reference images not displayed]

FLUOROSCOPY FOR SWALLOWING FUNCTION STUDY:
Fluoroscopy was provided for swallowing function study, which was administered by a speech pathologist.  Final results and recommendations from this study are contained within the speech pathology report.

## 2019-03-29 IMAGING — RF DG ESOPHAGUS
5 series · 17 of 17 positions shown · non-contrast
Comparison: None.

CLINICAL DATA: Difficulty swallowing.

EXAM:
ESOPHOGRAM/BARIUM SWALLOW
TECHNIQUE: Single contrast examination was performed using  water-soluble.
FLUOROSCOPY TIME:  Fluoroscopy Time:  1 minutes 12 seconds
Radiation Exposure Index (if provided by the fluoroscopic device):
9.7 mGy

[Series 1: cp_standard · 0.51mm/px · 4 of 82 frames shown (1 of 5)]
[frame 13/82]
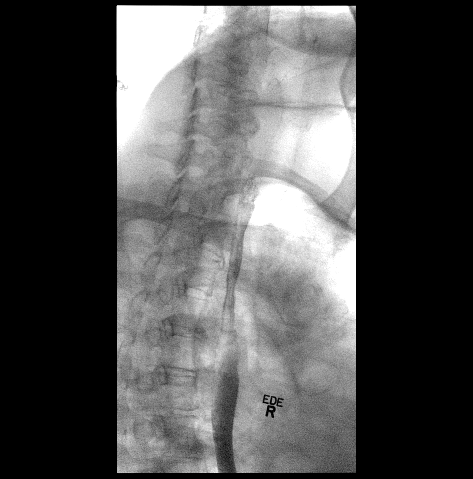
[frame 42/82]
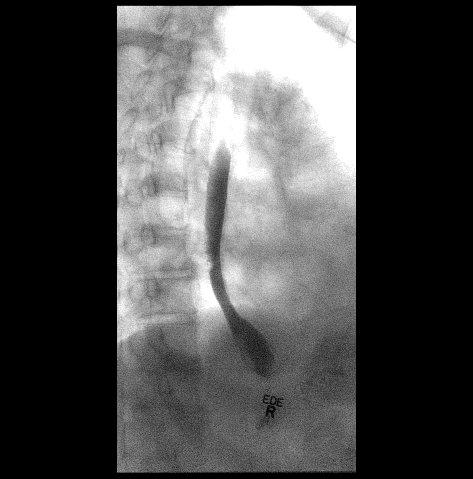
[frame 56/82]
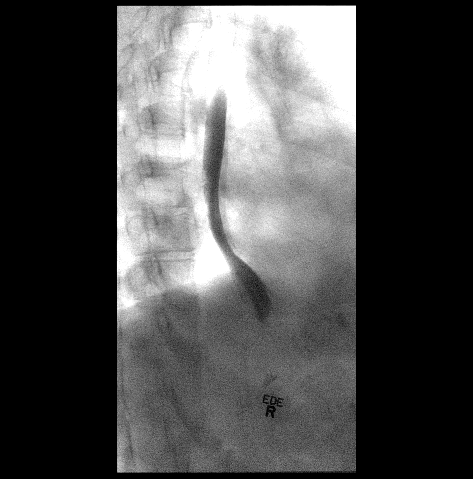
[frame 70/82]
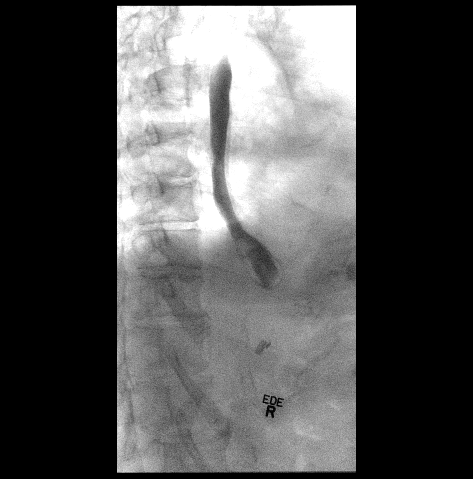

[Series 2: cp_standard · 0.51mm/px · 4 of 73 frames shown (2 of 5)]
[frame 4/73]
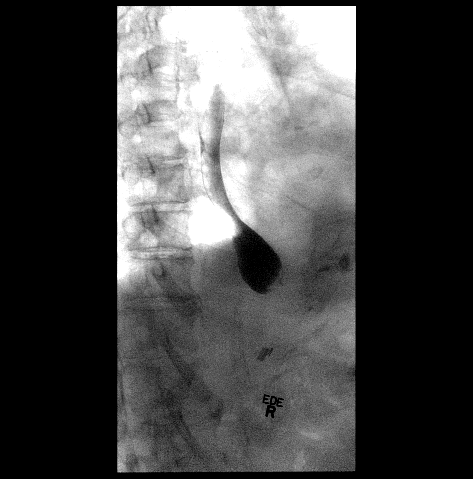
[frame 11/73]
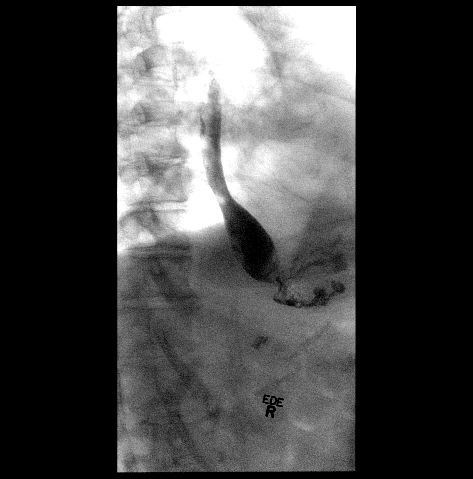
[frame 37/73]
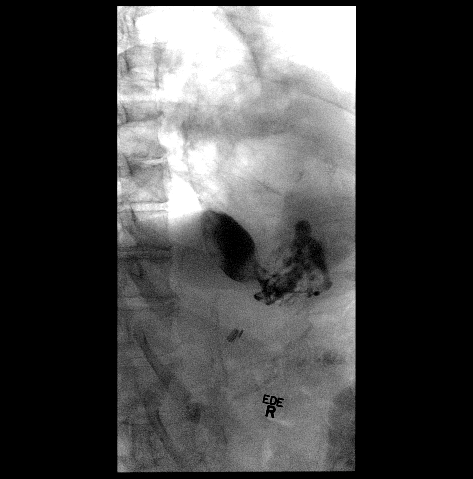
[frame 63/73]
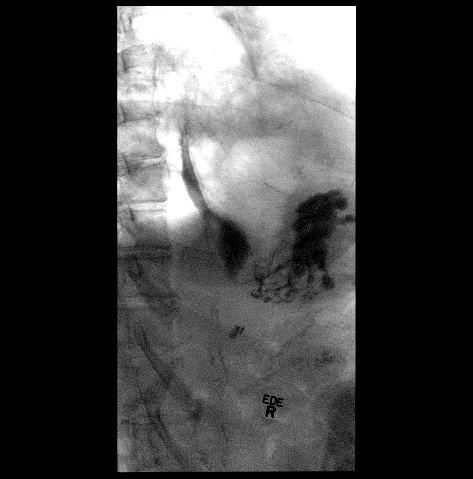

[Series 3: cp_standard · 0.51mm/px · 4 of 130 frames shown (3 of 5)]
[frame 20/130]
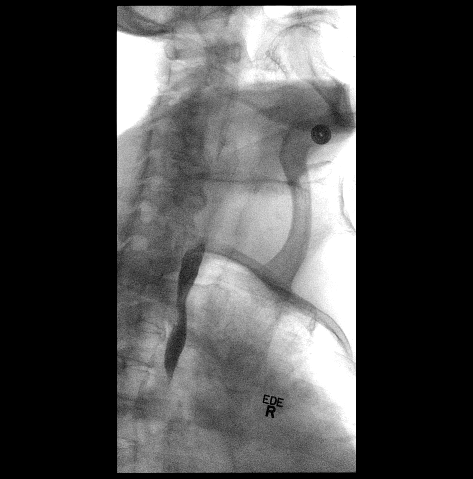
[frame 52/130]
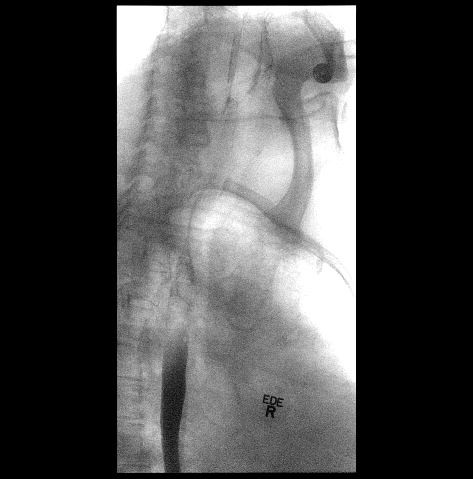
[frame 66/130]
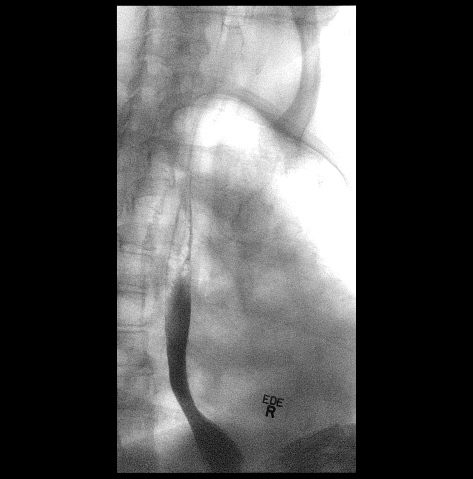
[frame 111/130]
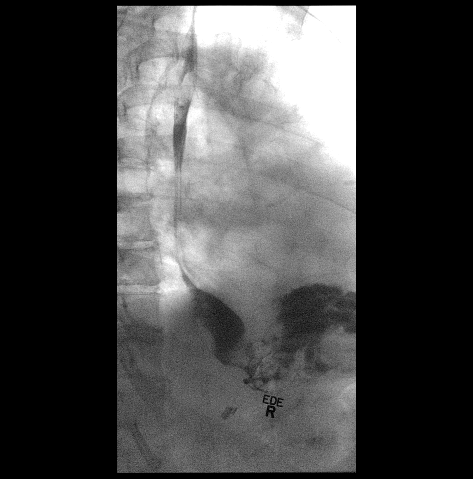

[Series 4: cp_standard · 0.51mm/px · 4 of 26 frames shown (4 of 5)]
[frame 4/26]
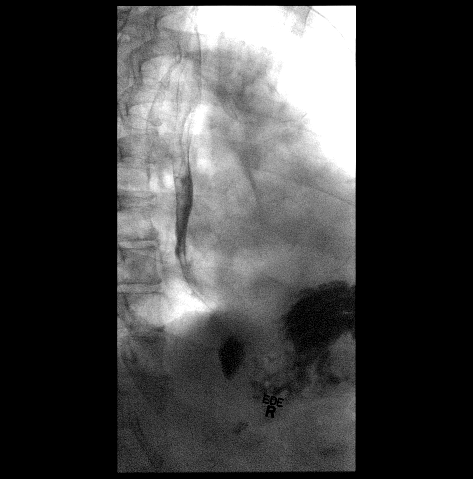
[frame 14/26]
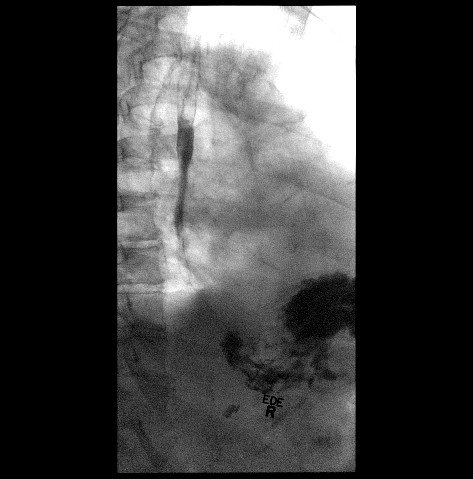
[frame 22/26]
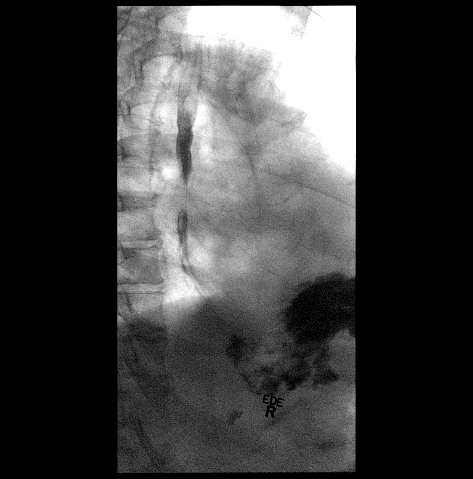
[frame 23/26]
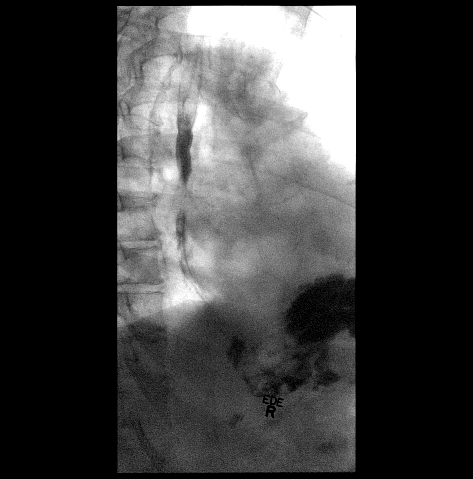

[Series 5: cp_standard · 0.27mm/px · 1 of 1 slices shown (5 of 5)]
[im 1/1]
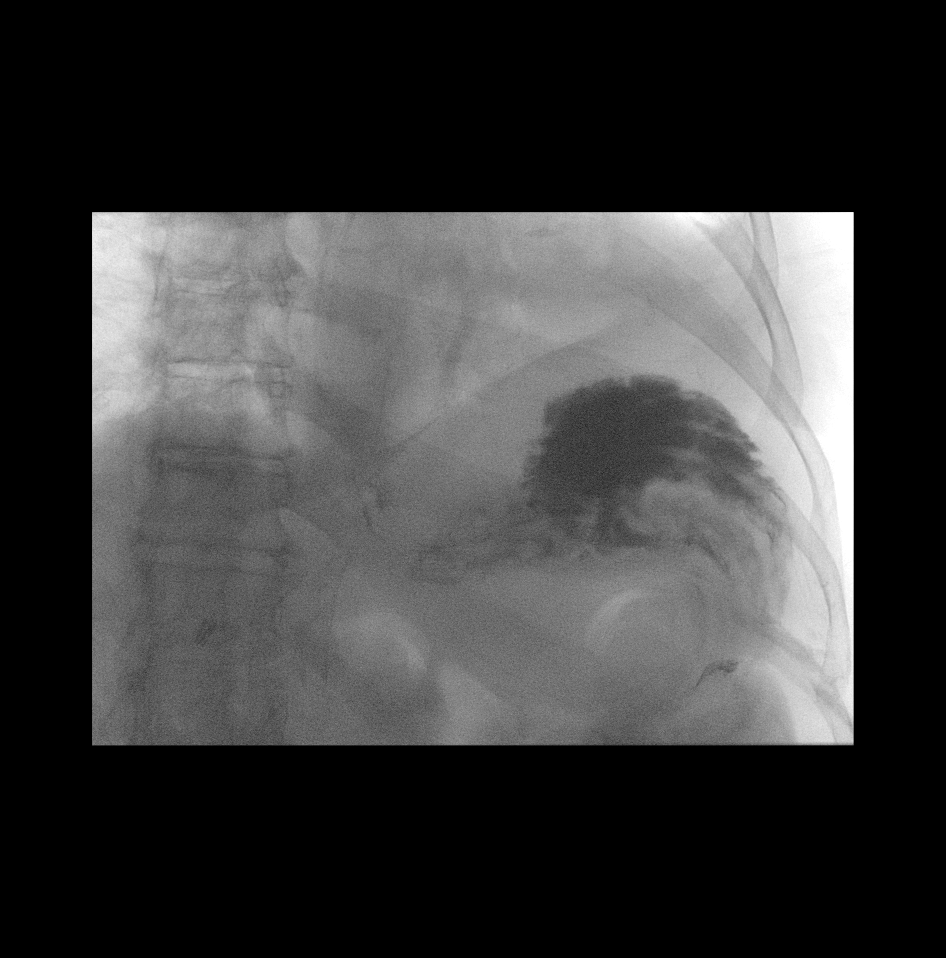

[17 of 17 positions shown; findings below may reference images not displayed]

FINDINGS: The entire examination was performed in the semi upright position.
There was normal pharyngeal anatomy and motility. Contrast flowed
freely through the esophagus without evidence of stricture or mass.
There was normal esophageal mucosa without evidence of irregularity
or ulceration. Esophageal motility was normal. No evidence of
reflux. No definite hiatal hernia was demonstrated.
IMPRESSION: Normal single contrast barium swallow.  No esophageal stricture.

## 2019-03-30 IMAGING — CR DG ABD PORTABLE 1V
1 series · 1 of 1 positions shown · non-contrast
Comparison: Abdominal radiograph dated 04/27/2017

CLINICAL DATA: 68-year-old female with enteric tube placement.

EXAM:
PORTABLE ABDOMEN - 1 VIEW

[AP]
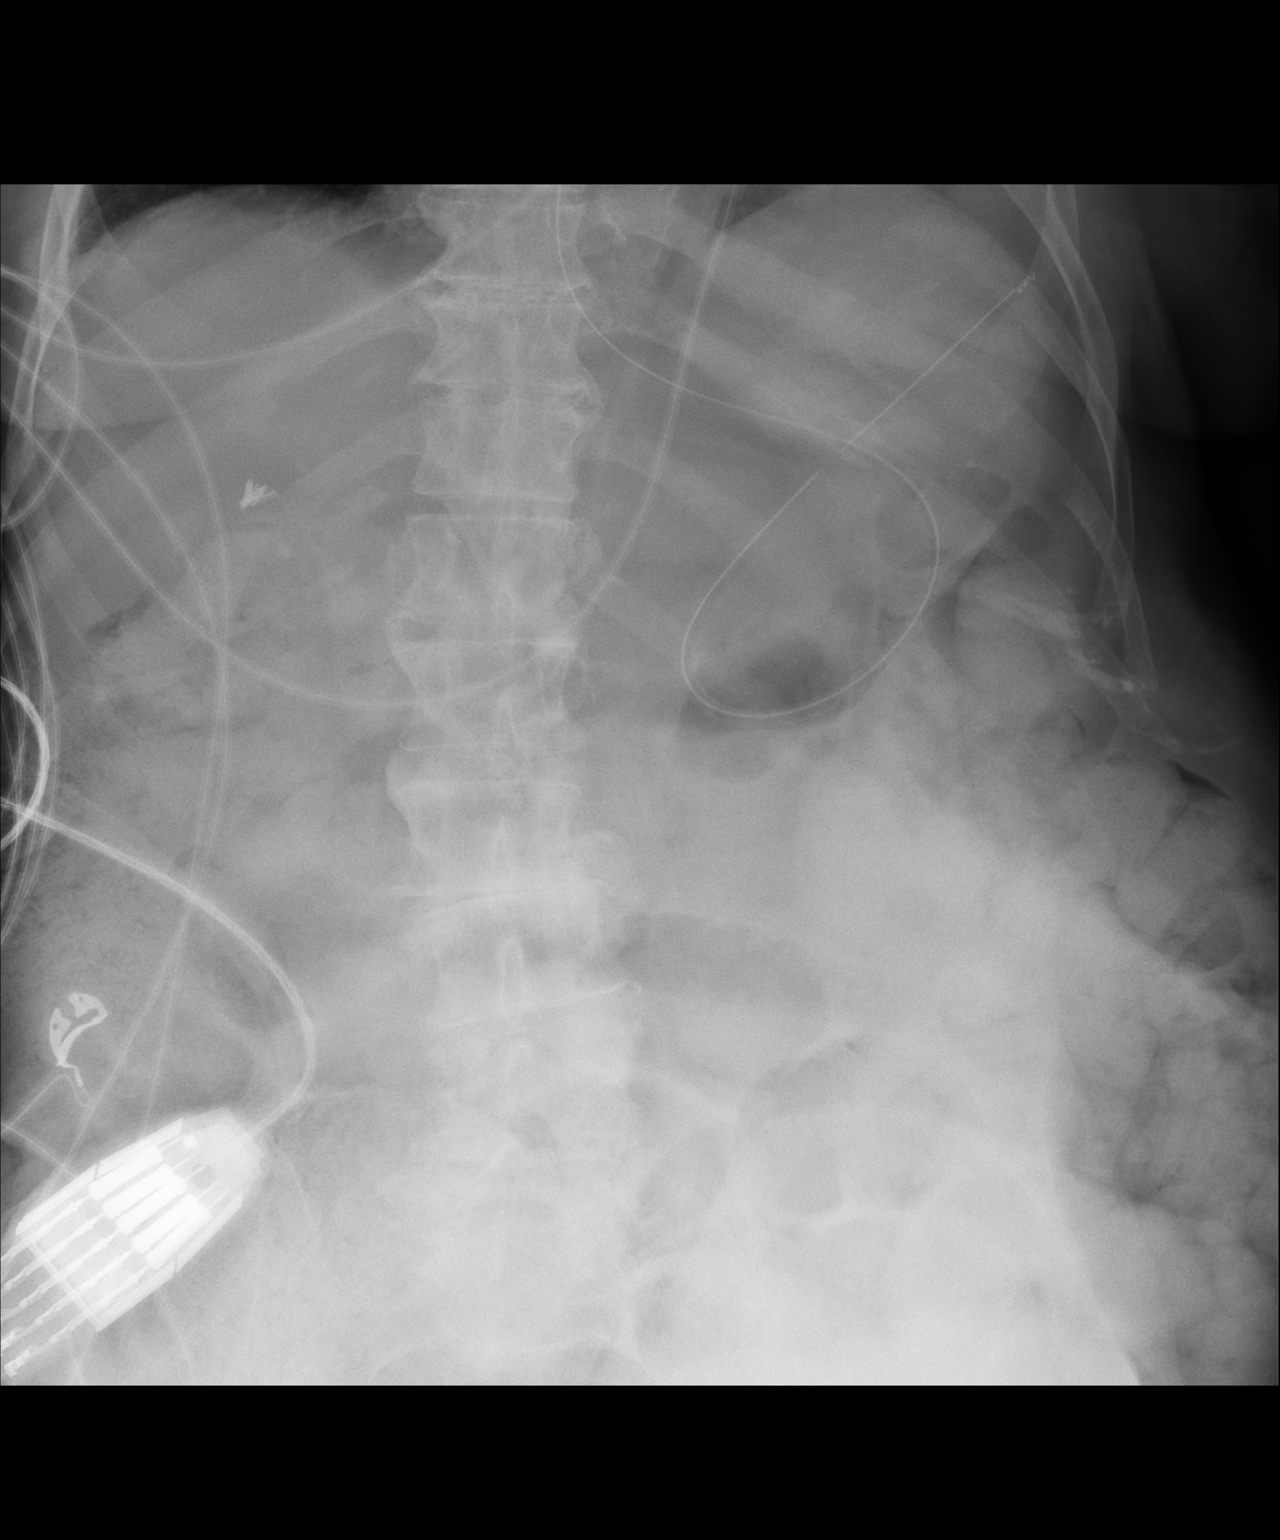

[1 of 1 positions shown; findings below may reference images not displayed]

FINDINGS: Partially visualized enteric tube with tip and side-port in the left
upper abdomen likely within the stomach. The tube courses into the
body of the stomach and turns round with tip likely in the gastric
fundus. Large amount of stool noted throughout the colon. No bowel
dilatation. Right upper quadrant cholecystectomy clips. Left lung
base densities may represent atelectasis versus infiltrate. There is
degenerative changes of the spine.
IMPRESSION: 1. Partially visualized enteric tube with tip in the stomach.
2. Large amount of colonic stool burden.  No bowel dilatation.
3. Left lung base atelectasis versus infiltrate.

## 2019-04-02 IMAGING — DX DG ABDOMEN 1V
1 series · 1 of 1 positions shown · non-contrast
Comparison: 04/29/2017

CLINICAL DATA: Confirm NG tube placement

EXAM:
ABDOMEN - 1 VIEW

[abdomen kub]
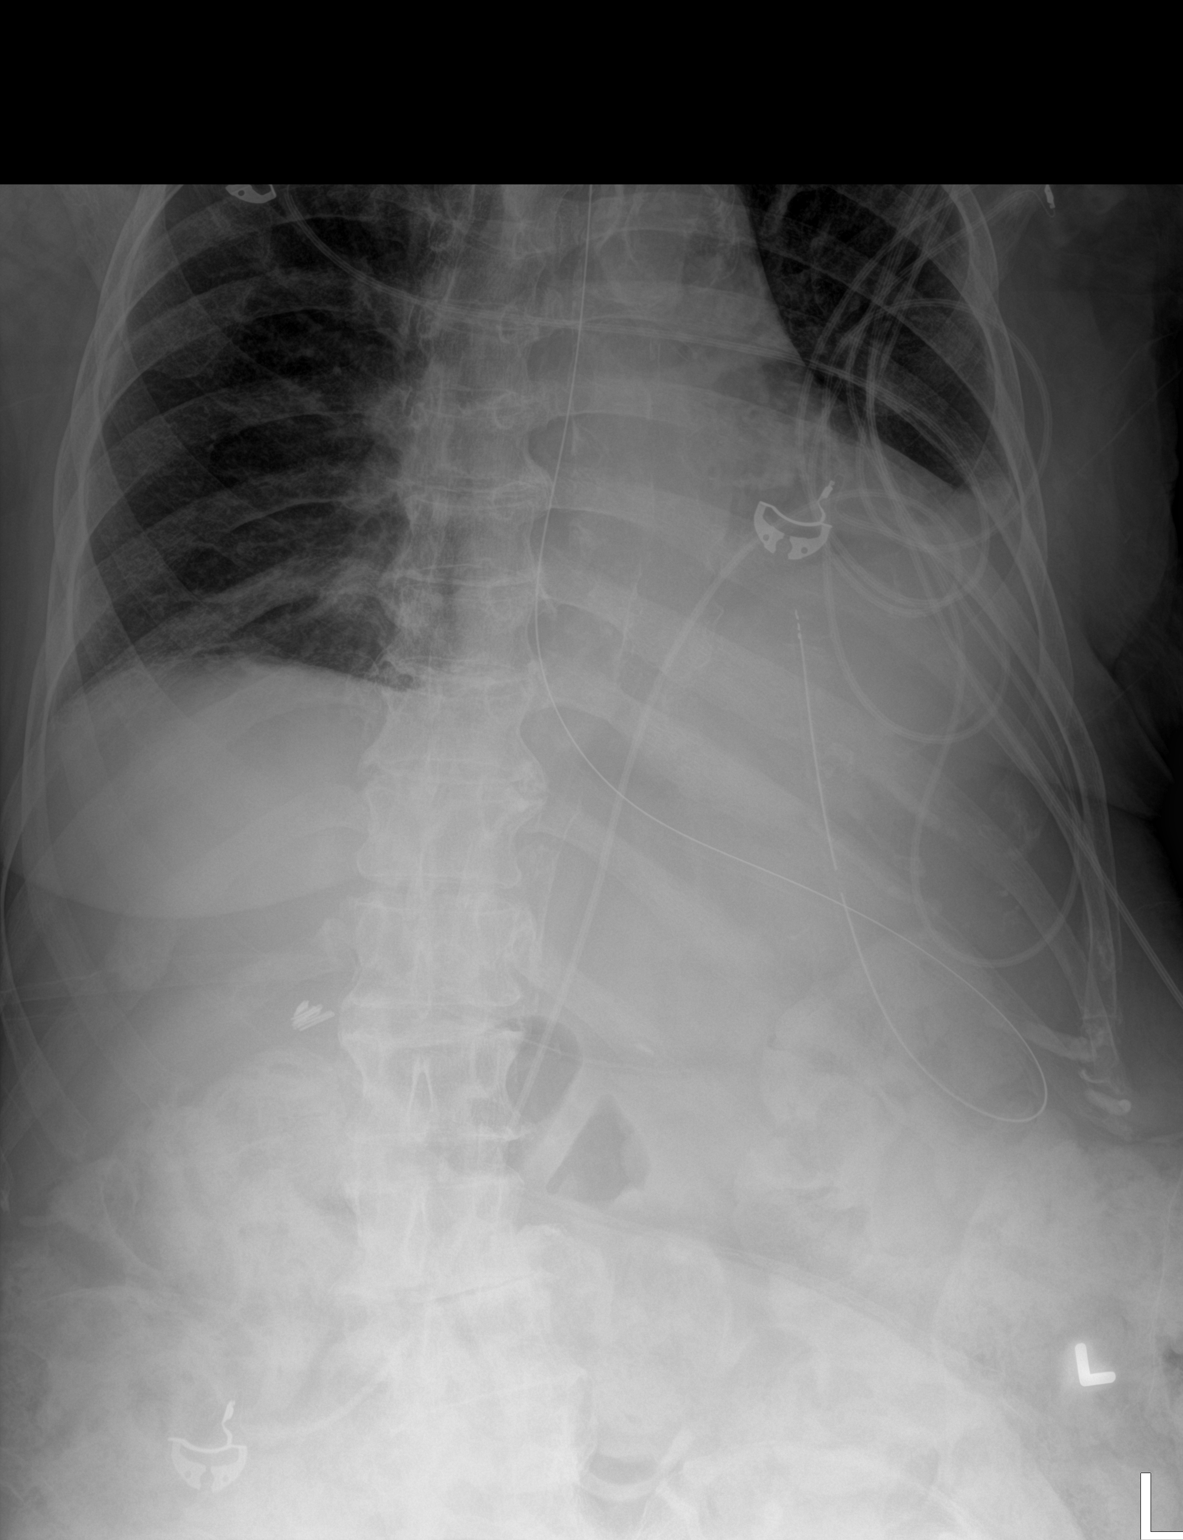

[1 of 1 positions shown; findings below may reference images not displayed]

FINDINGS: Enteric tube is coiled in the left upper quadrant consistent with
location in the body of the stomach. The tip is directed superiorly,
likely extending to the upper stomach. Diffusely stool-filled colon.
Cardiac enlargement. Calcification of the aorta. Degenerative
changes in the spine. Surgical clips in the right upper quadrant.
Old right rib fractures.
IMPRESSION: Enteric tube tip is coiled in the left upper quadrant consistent
with location in the body of the stomach with tip extending to the
upper stomach.

## 2019-04-03 IMAGING — RF DG SWALLOWING FUNCTION - [facility]
1 series · 18 of 24 positions shown · non-contrast
Comparison: none

[Series 1: run · 24 acquisitions, 18 frames shown]
[im 1/24]
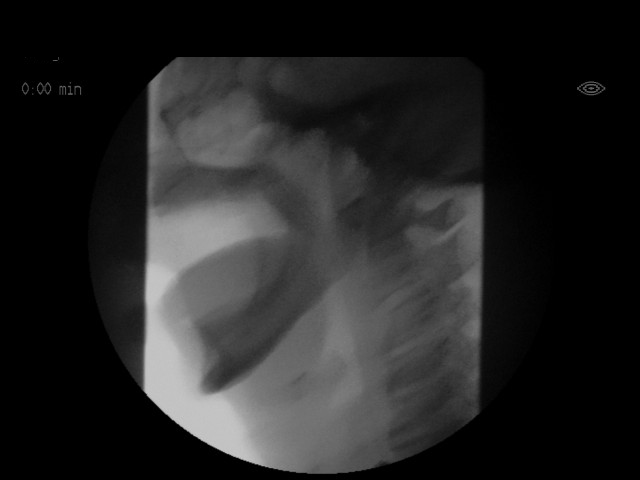
[im 3/24]
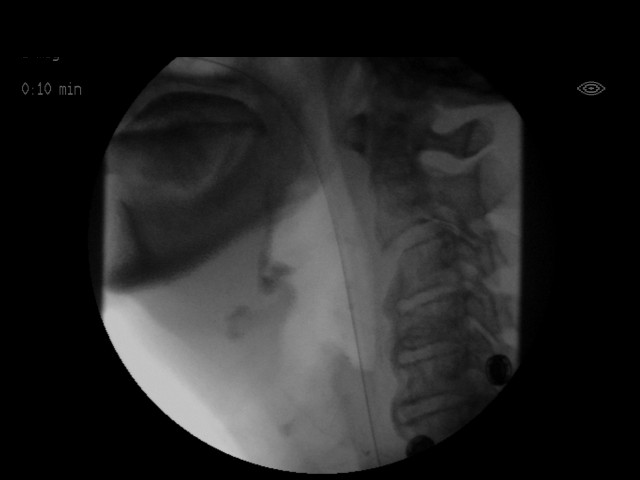
[im 4/24]
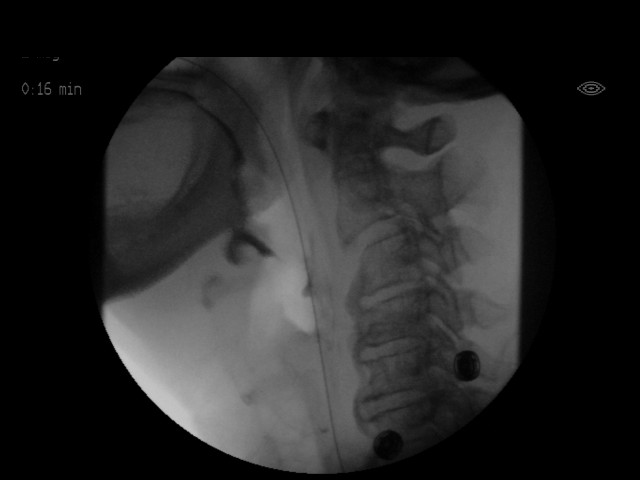
[im 5/24]
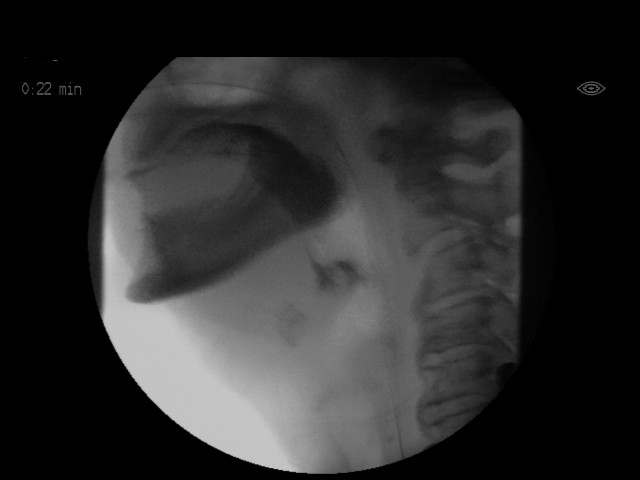
[im 7/24]
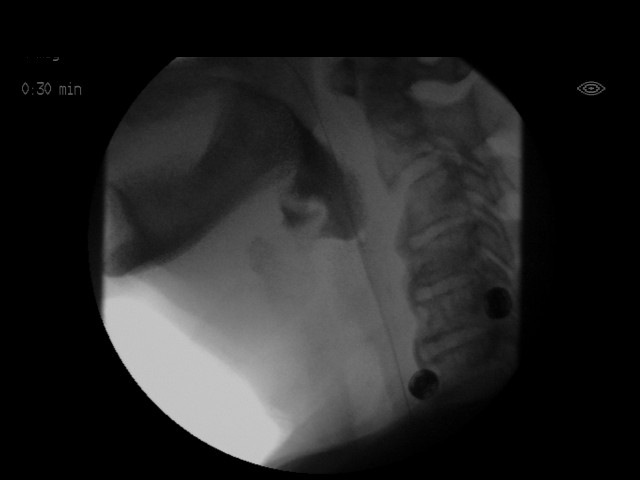
[im 8/24]
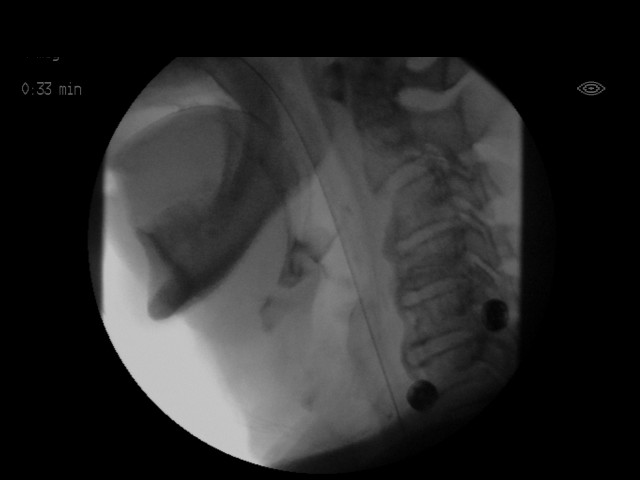
[im 9/24]
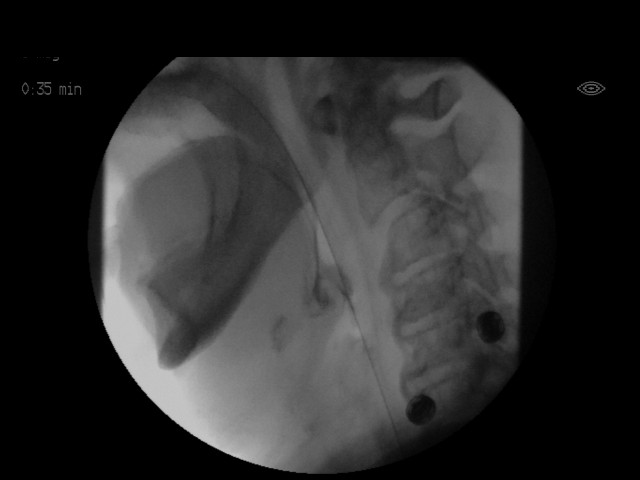
[im 11/24]
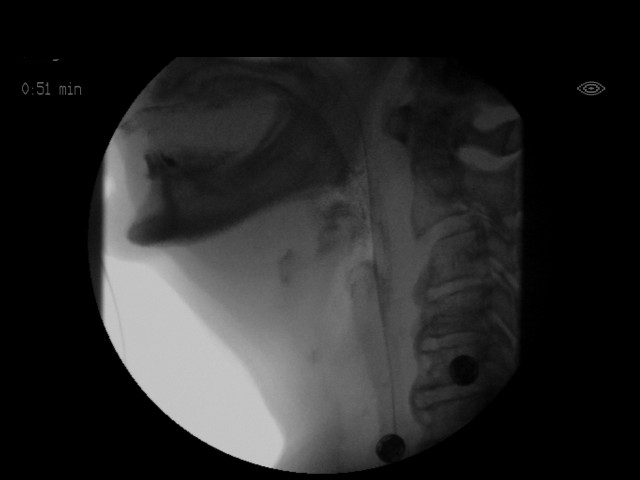
[im 12/24]
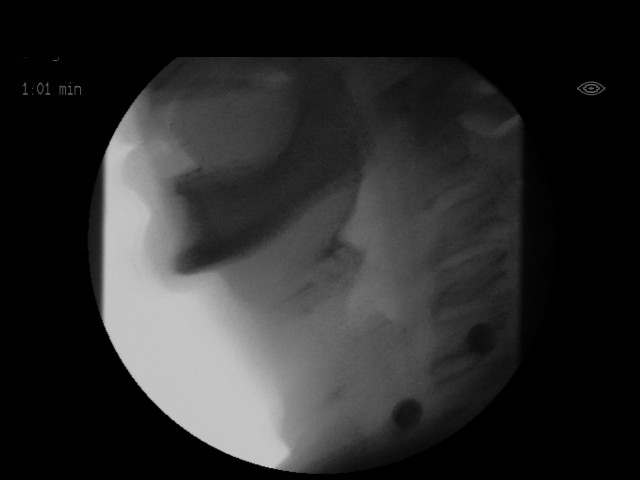
[im 13/24]
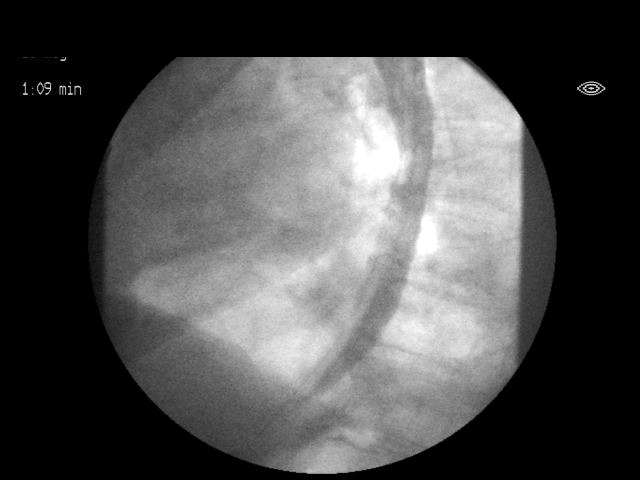
[im 15/24]
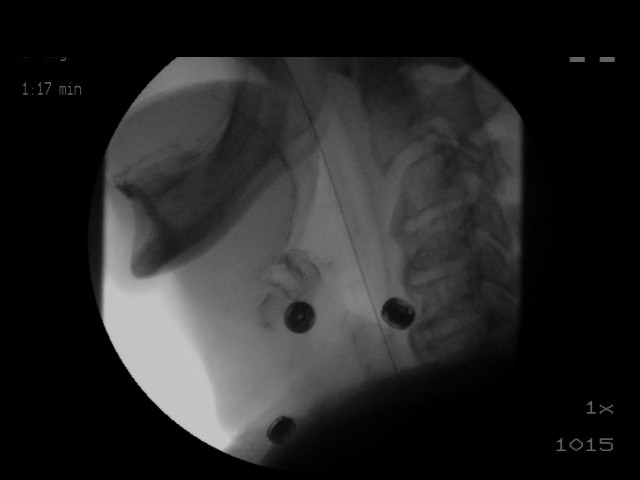
[im 16/24]
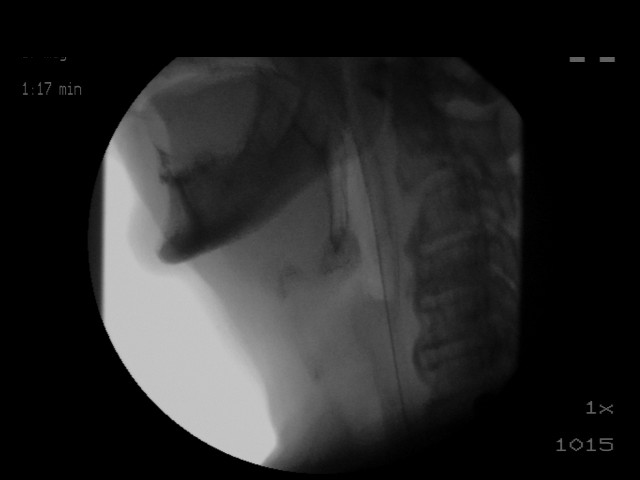
[im 17/24]
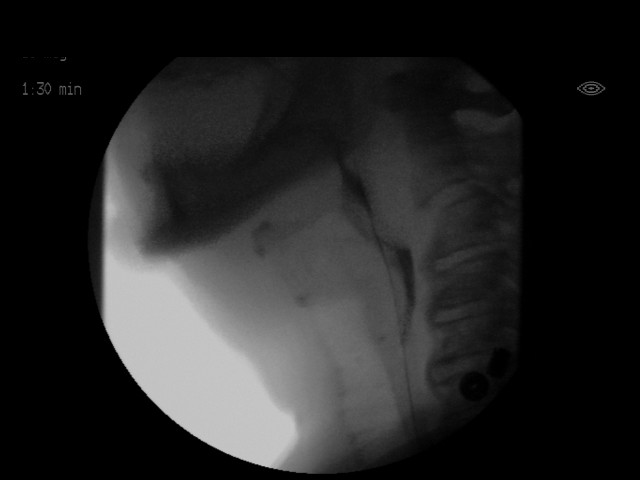
[im 19/24]
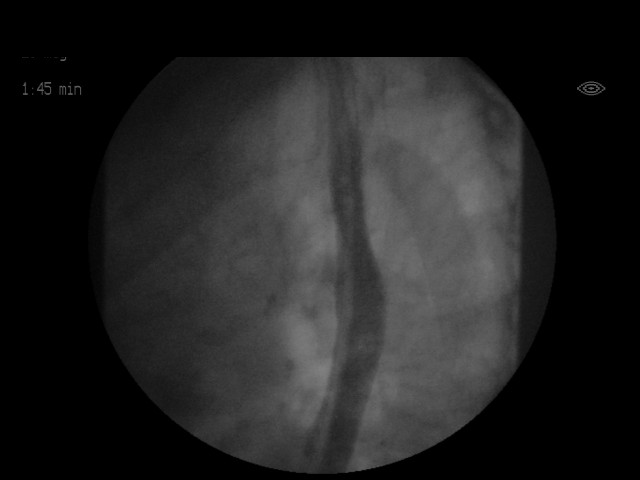
[im 20/24]
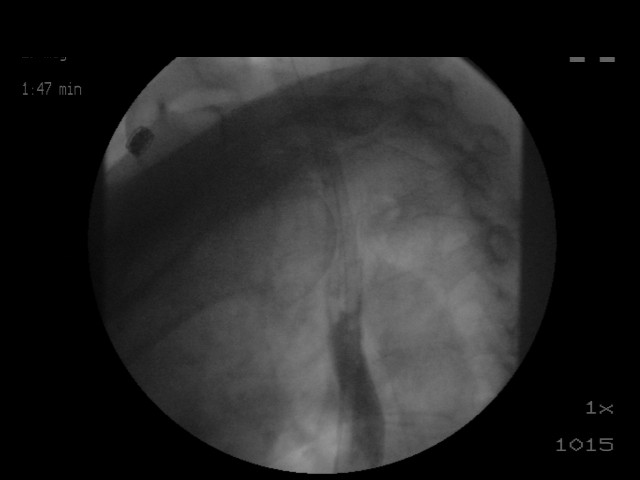
[im 21/24]
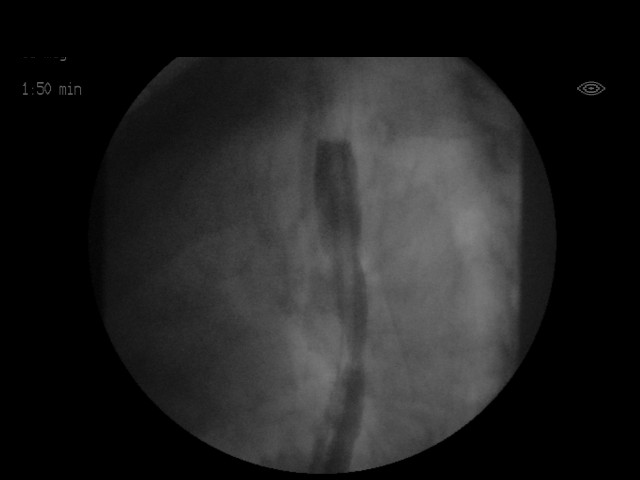
[im 23/24]
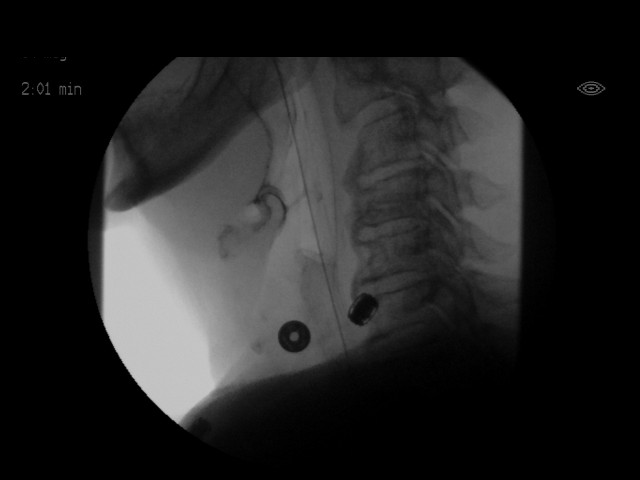
[im 24/24]
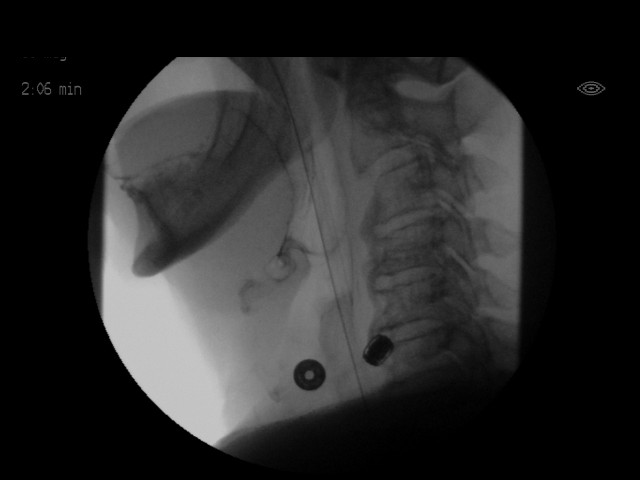

[18 of 24 positions shown; findings below may reference images not displayed]

FLUOROSCOPY FOR SWALLOWING FUNCTION STUDY:
Fluoroscopy was provided for swallowing function study, which was administered by a speech pathologist.  Final results and recommendations from this study are contained within the speech pathology report.

## 2019-04-04 IMAGING — DX DG CHEST 1V PORT
1 series · 1 of 1 positions shown · non-contrast
Comparison: 04/24/2017

CLINICAL DATA: Hypoxia.

EXAM:
PORTABLE CHEST 1 VIEW

[chest]
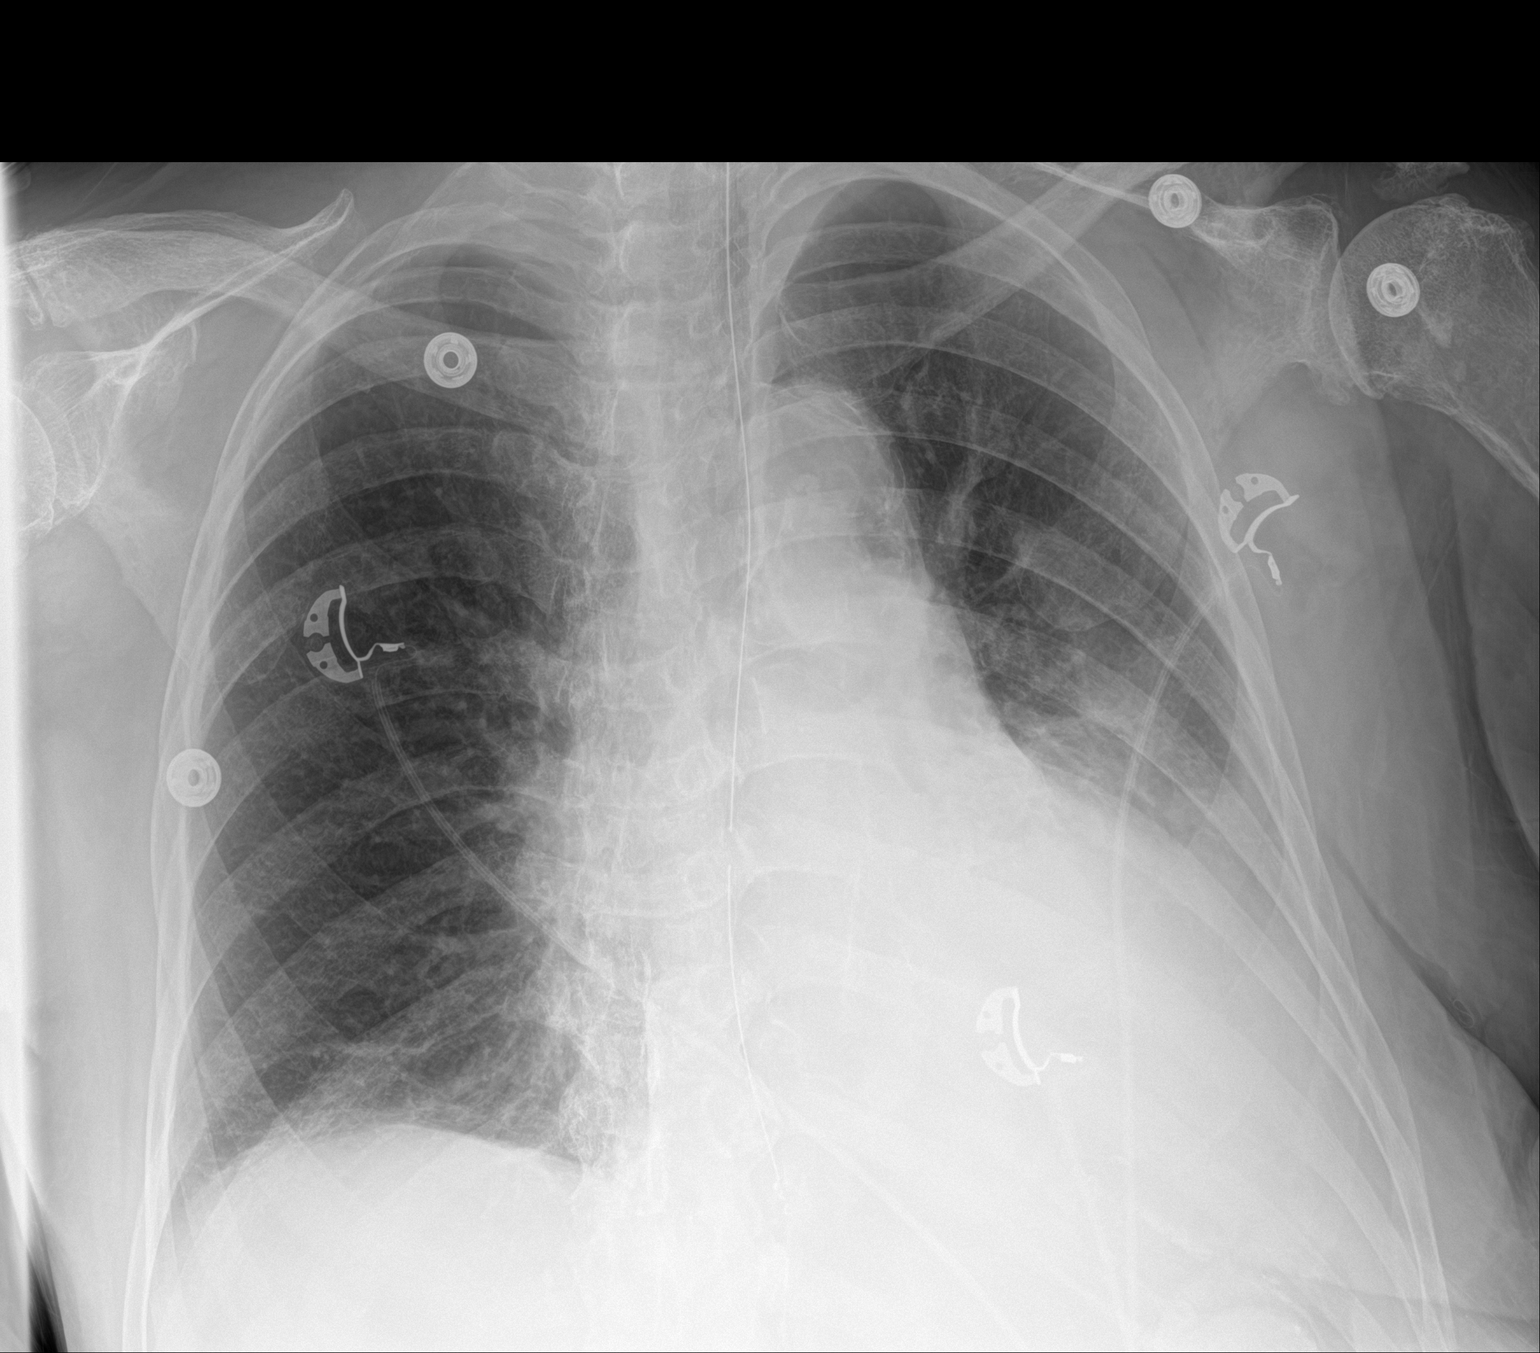

[1 of 1 positions shown; findings below may reference images not displayed]

FINDINGS: Patient is partially rotated to the left. Heart size is stable.
Aortic atherosclerosis.

Confluent left lower lung opacity is mildly increased since previous
study, and may be due to increased pleural effusion and/or pulmonary
infiltrate or atelectasis. Right lung remains clear. No evidence of
pneumothorax.

A nasogastric tube is seen with tip overlying the distal thoracic
esophagus, just above the GE junction.
IMPRESSION: Increased inflow left lower lung opacity, which may be due to
increased pleural effusion, pulmonary infiltrate, and/or
atelectasis.

Nasogastric tube tip overlies the distal esophagus, just above the
GE junction.

## 2019-04-06 IMAGING — DX DG CHEST 1V PORT
1 series · 1 of 1 positions shown · non-contrast
Comparison: Radiograph 05/04/2017.  CT 03/21/2017

CLINICAL DATA: Cough.

EXAM:
PORTABLE CHEST 1 VIEW

[chest]
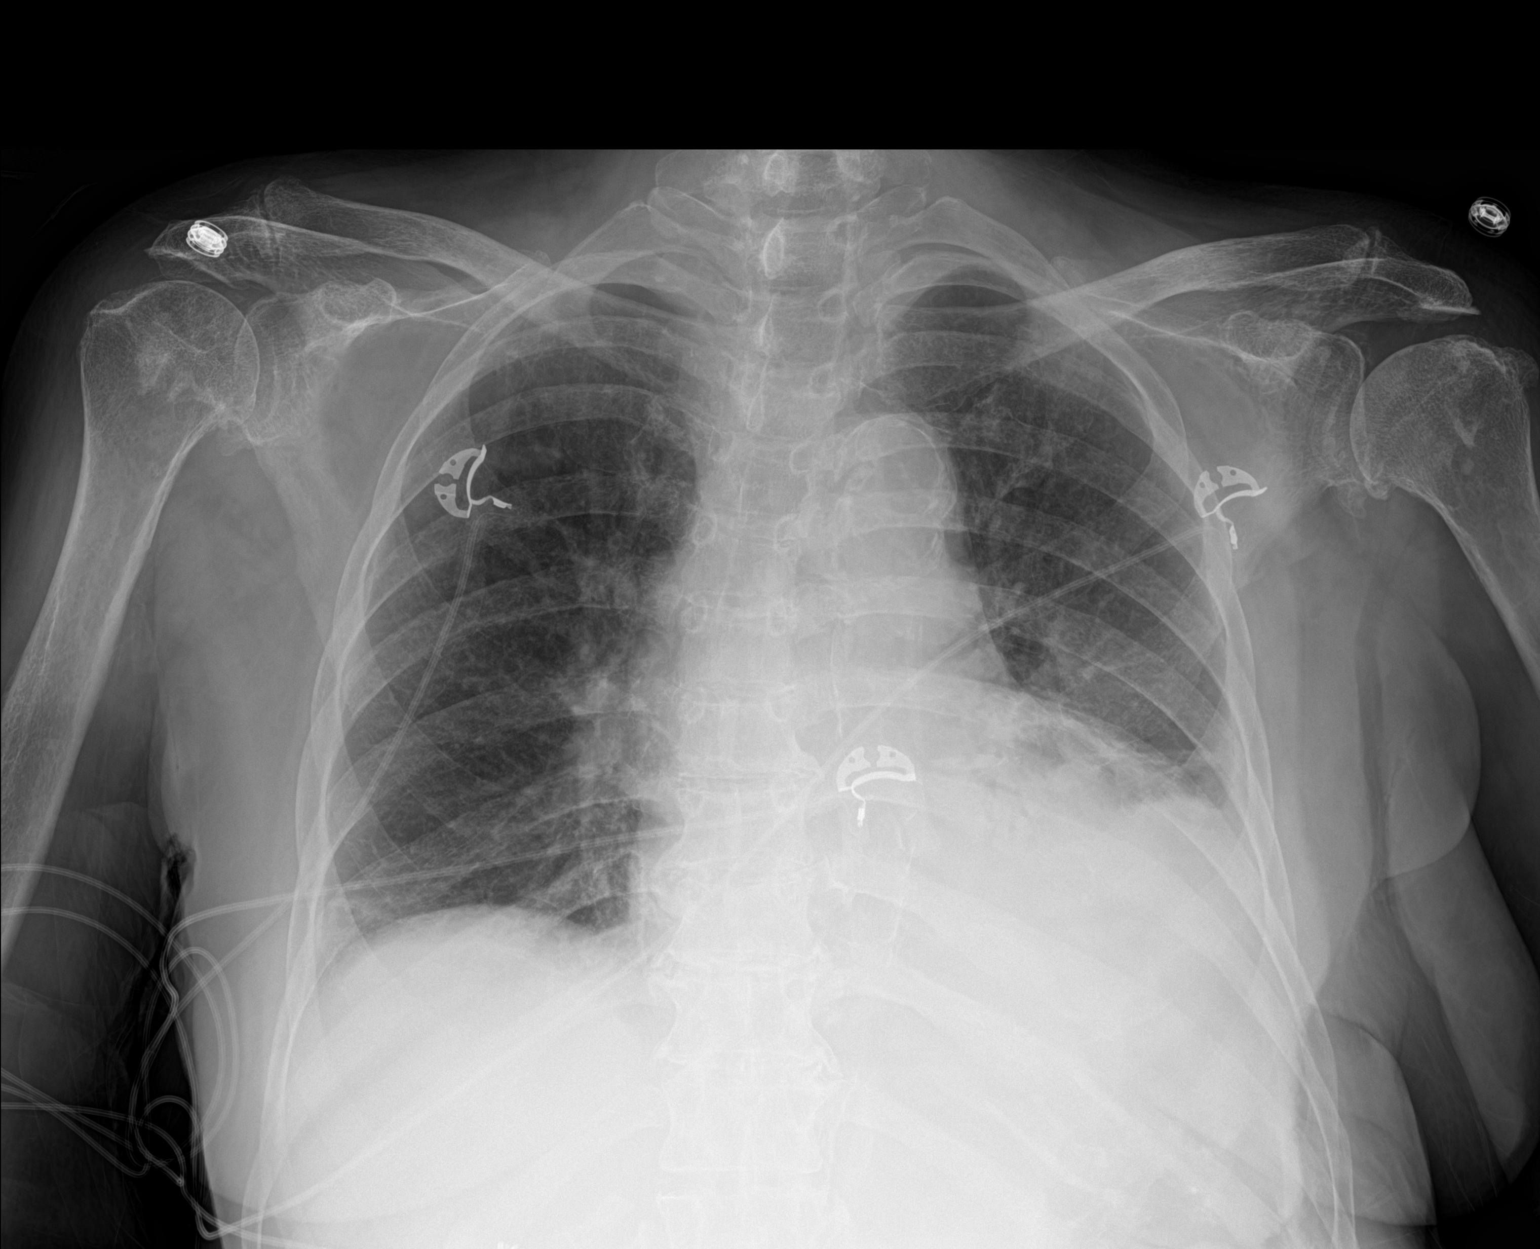

[1 of 1 positions shown; findings below may reference images not displayed]

FINDINGS: Enteric tube is been removed. Elevated left hemidiaphragm with
persistent confluent left basilar opacity and possible small pleural
effusion. Minimal streaky right basilar atelectasis. Unchanged heart
size and mediastinal contours with dense aortic atherosclerosis.
Bronchovascular crowding related to lower lung volumes, no
convincing pulmonary edema. No pneumothorax.
IMPRESSION: Unchanged left opacity from recent exam, likely combination of
consolidation and pleural effusion.

## 2019-05-28 IMAGING — CT CT CERVICAL SPINE W/O CM
3 of 8 series · 12 of 33 positions shown, 14 images · non-contrast
Comparison: CT head 01/25/2017, 10/19/2016 and earlier. CT cervical
spine 10/19/2016, 09/12/2016 and earlier. MRI brain 01/25/2017.

CLINICAL DATA: 76-year-old who fell 3 days ago, striking her head.
Patient currently anticoagulated with clopidogrel.

EXAM:
CT HEAD WITHOUT CONTRAST
CT CERVICAL SPINE WITHOUT CONTRAST
TECHNIQUE: Multidetector CT imaging of the head and cervical spine was
performed following the standard protocol without intravenous
contrast. Multiplanar CT image reconstructions of the cervical spine
were also generated.

[Series 10: coronal bone · coronal · 0.23mm/px · 1 of 61 slices shown]
[im 31/61  bone]
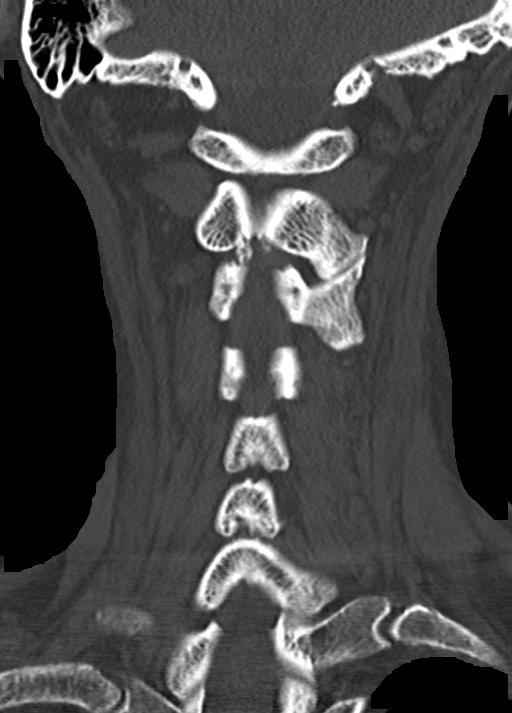

[Series 11: sagittal bone · sagittal · 0.23mm/px · 4 of 61 slices shown]
[im 13/61  bone]
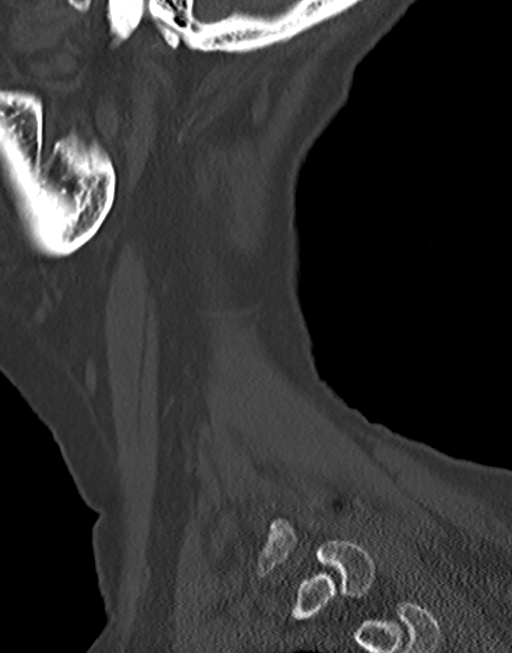
[im 25/61  bone]
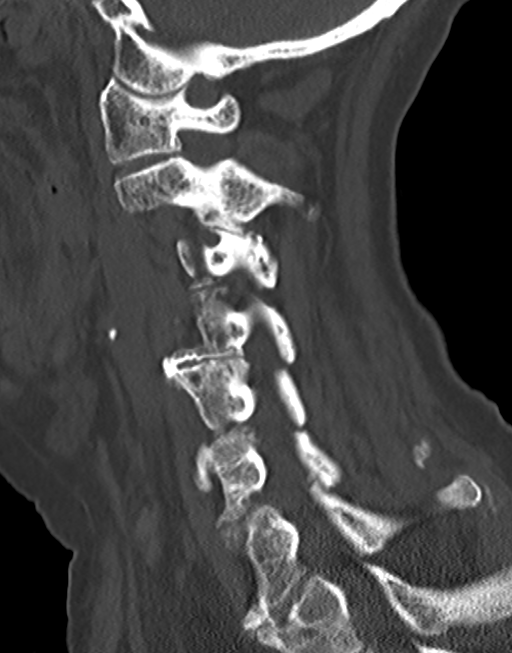
[im 37/61  bone]
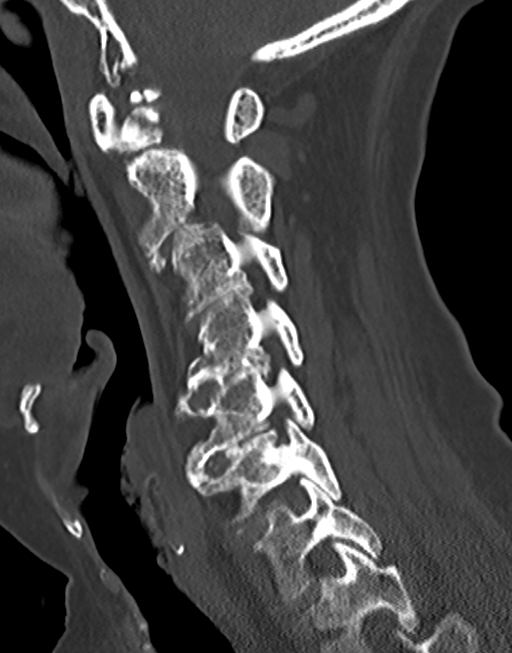
[im 49/61  bone]
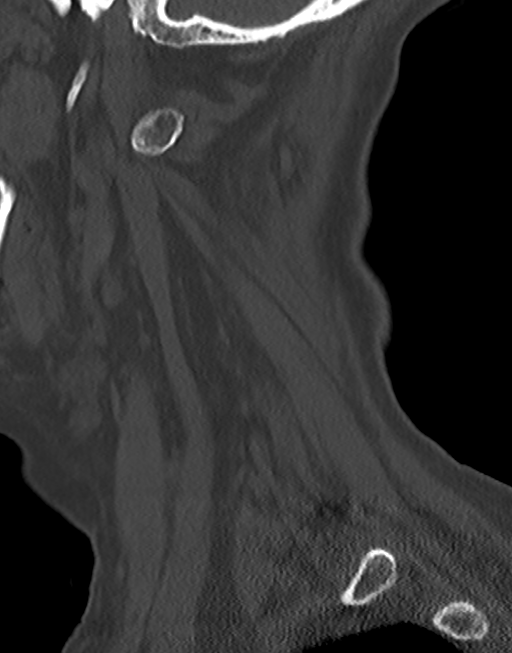

[Series 15: st thins · axial · 0.28mm/px · z∈[-234,-90]mm · 7 of 385 slices shown, 9 images]
[im 49/385  soft-tissue]
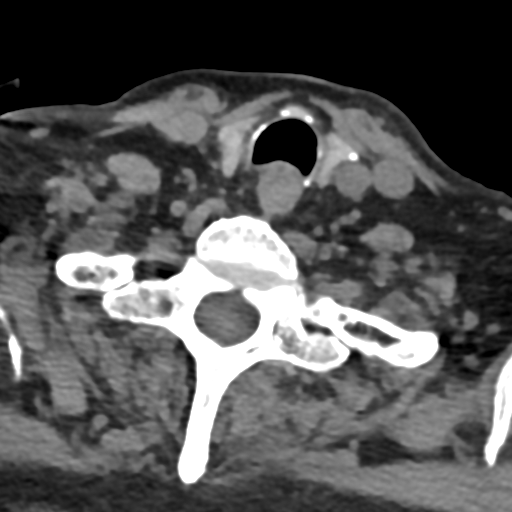
[im 49/385  bone]
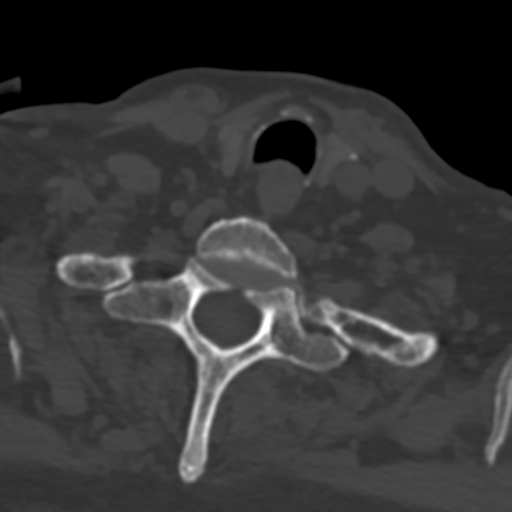
[im 97/385  bone]
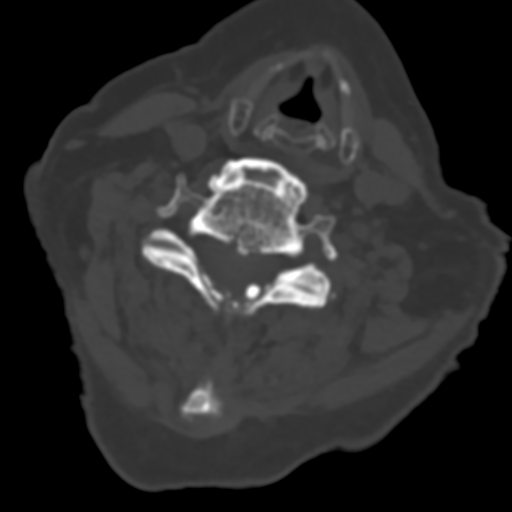
[im 145/385  bone]
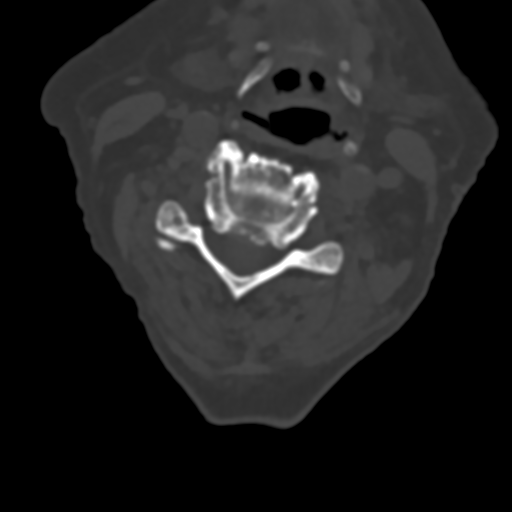
[im 193/385  bone]
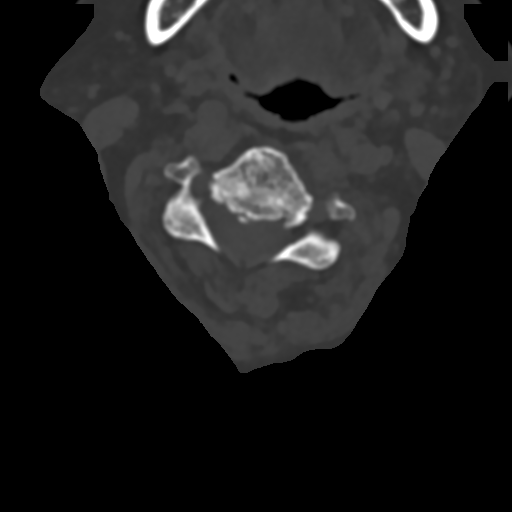
[im 241/385  soft-tissue]
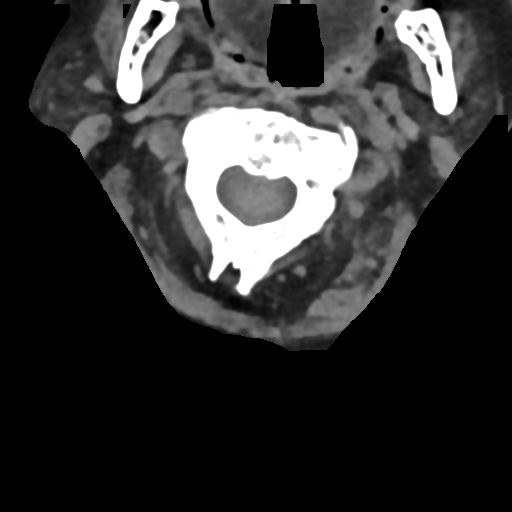
[im 241/385  bone]
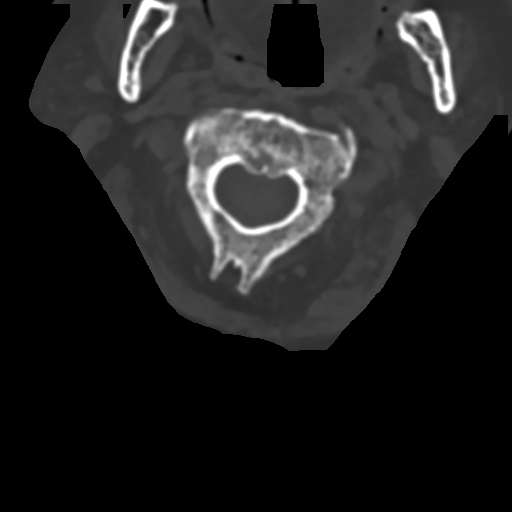
[im 289/385  bone]
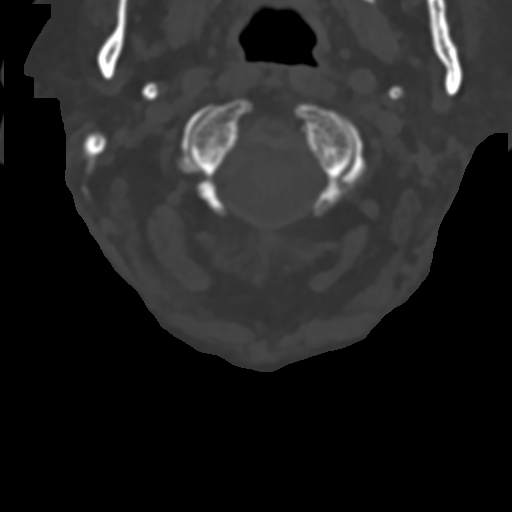
[im 337/385  bone]
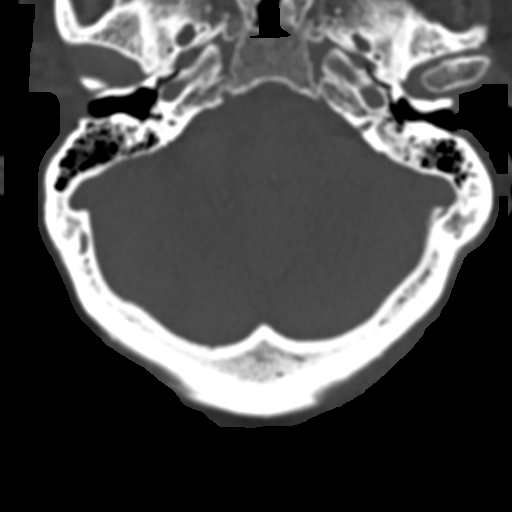

[12 of 33 positions shown; findings below may reference images not displayed]

FINDINGS: CT HEAD FINDINGS

Brain: Moderate age related cortical and deep atrophy. Moderate to
severe changes of small vessel disease of the white matter
diffusely. Remote lacunar strokes in the basal ganglia bilaterally.
No mass lesion. No midline shift. No acute hemorrhage or hematoma.
No extra-axial fluid collections. No evidence of acute infarction.

Vascular: Moderate bilateral carotid siphon and vertebral artery
atherosclerosis. No hyperdense vessel.

Skull: No skull fracture or other focal osseous abnormality
involving the skull.

Sinuses/Orbits: Visualized paranasal sinuses, bilateral mastoid air
cells and bilateral middle ear cavities well-aerated. Visualized
orbits and globes normal in appearance.

Other: None.

CT CERVICAL SPINE FINDINGS

Alignment: Reversal of the usual cervical lordosis. Anatomic
posterior alignment.

Skull base and vertebrae: Osseous demineralization. Nondisplaced
well corticated fracture involving the tip of the left facet at C6,
not likely acute. Facet joints otherwise intact. No acute fractures
identified involving the cervical spine. Coronal reformatted images
demonstrate an intact craniocervical junction, intact dens with
erosions, and intact lateral masses throughout. Extensive calcified
pannus posterior and lateral to the dens.

Soft tissues and spinal canal: No evidence of spinal canal hematoma.
Calcification involving the posterior longitudinal ligament at C3-4,
C5, and upper C7. Calcification involving the ligamentum flavum a at
C6-7. Severe multifactorial spinal stenosis at the C4-5, C5-6 and
C6-7 levels.

Disc levels: Disc space narrowing and endplate hypertrophic changes
at every cervical level, greatest at C3-4. Bridging anterior
osteophytes at C3-4, C4-5, and C5-6. Uncinate and facet hypertrophy
account for multilevel foraminal stenoses including mild bilateral
C2-3, severe bilateral C3-4, severe bilateral C4-5, severe left and
moderate right C5-6.

Upper chest: Visualized lung apices clear. Mild atherosclerosis
involving the proximal great vessels.

Other: Mild bilateral cervical carotid atherosclerosis.
IMPRESSION: Brain:

1. No acute intracranial abnormality.
2. Stable moderate generalized atrophy and moderate chronic
microvascular ischemic changes of the white matter diffusely.
3. Remote lacunar strokes in the basal ganglia bilaterally.

Cervical spine:

1. No acute fractures identified.
2. Possible remote fracture involving the tip of the left facet at
C6. This does not appear acute as it is well corticated.
3. Degenerative changes throughout the cervical spine as detailed
above. Multifactorial spinal stenosis at C4-5, C5-6 and C6-7 levels.

## 2019-07-14 IMAGING — DX DG ABDOMEN 2V
3 series · 3 of 3 positions shown · non-contrast
Comparison: April 27, 2017

CLINICAL DATA: Vomiting

EXAM:
ABDOMEN - 2 VIEW

[abdomen supine (1 of 2)]
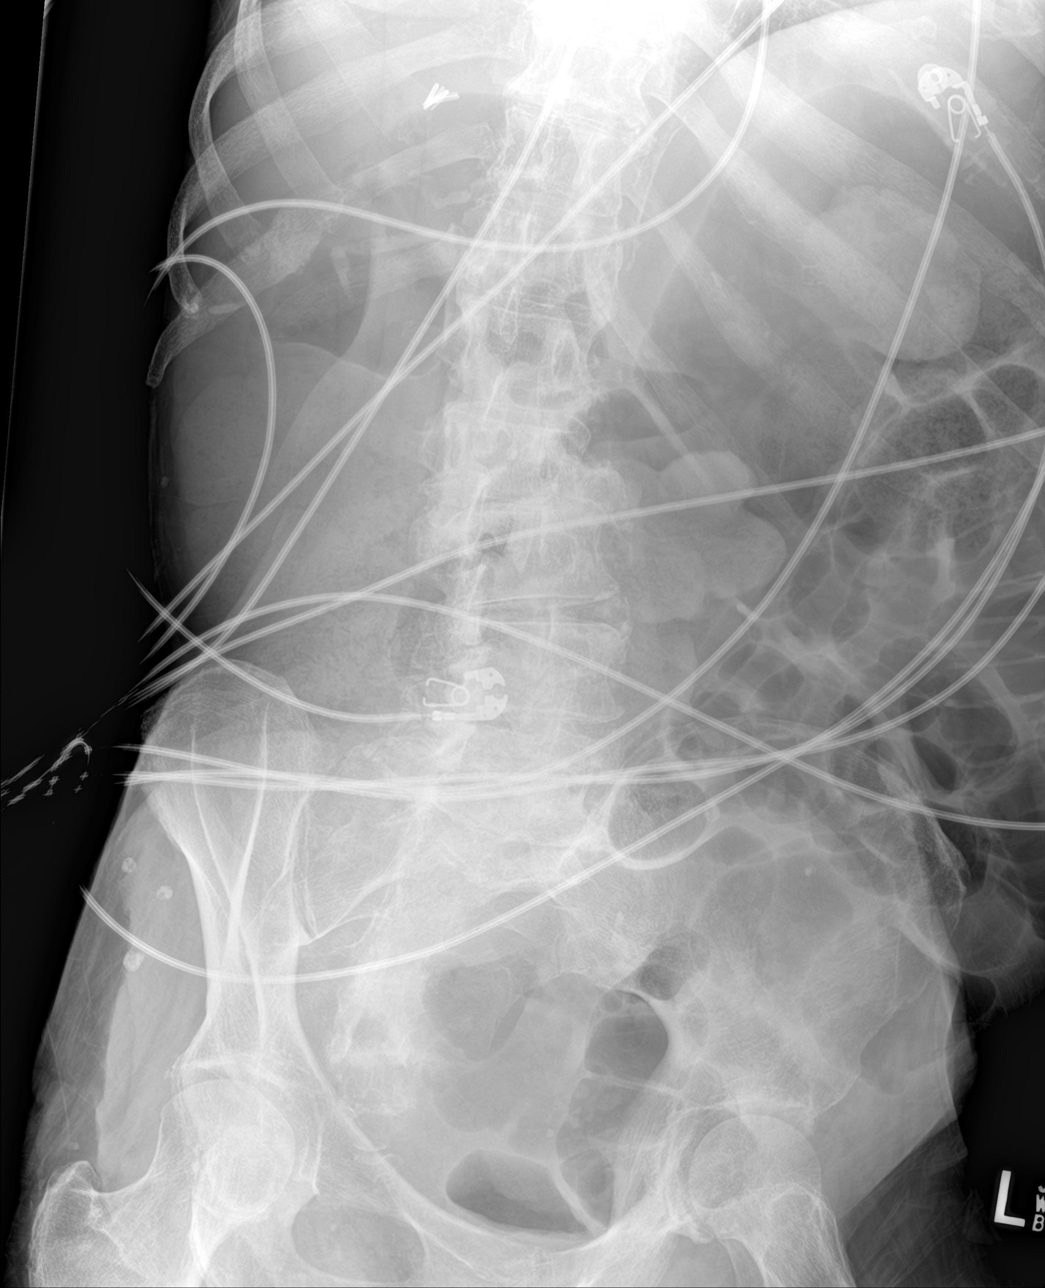

[abdomen supine (2 of 2)]
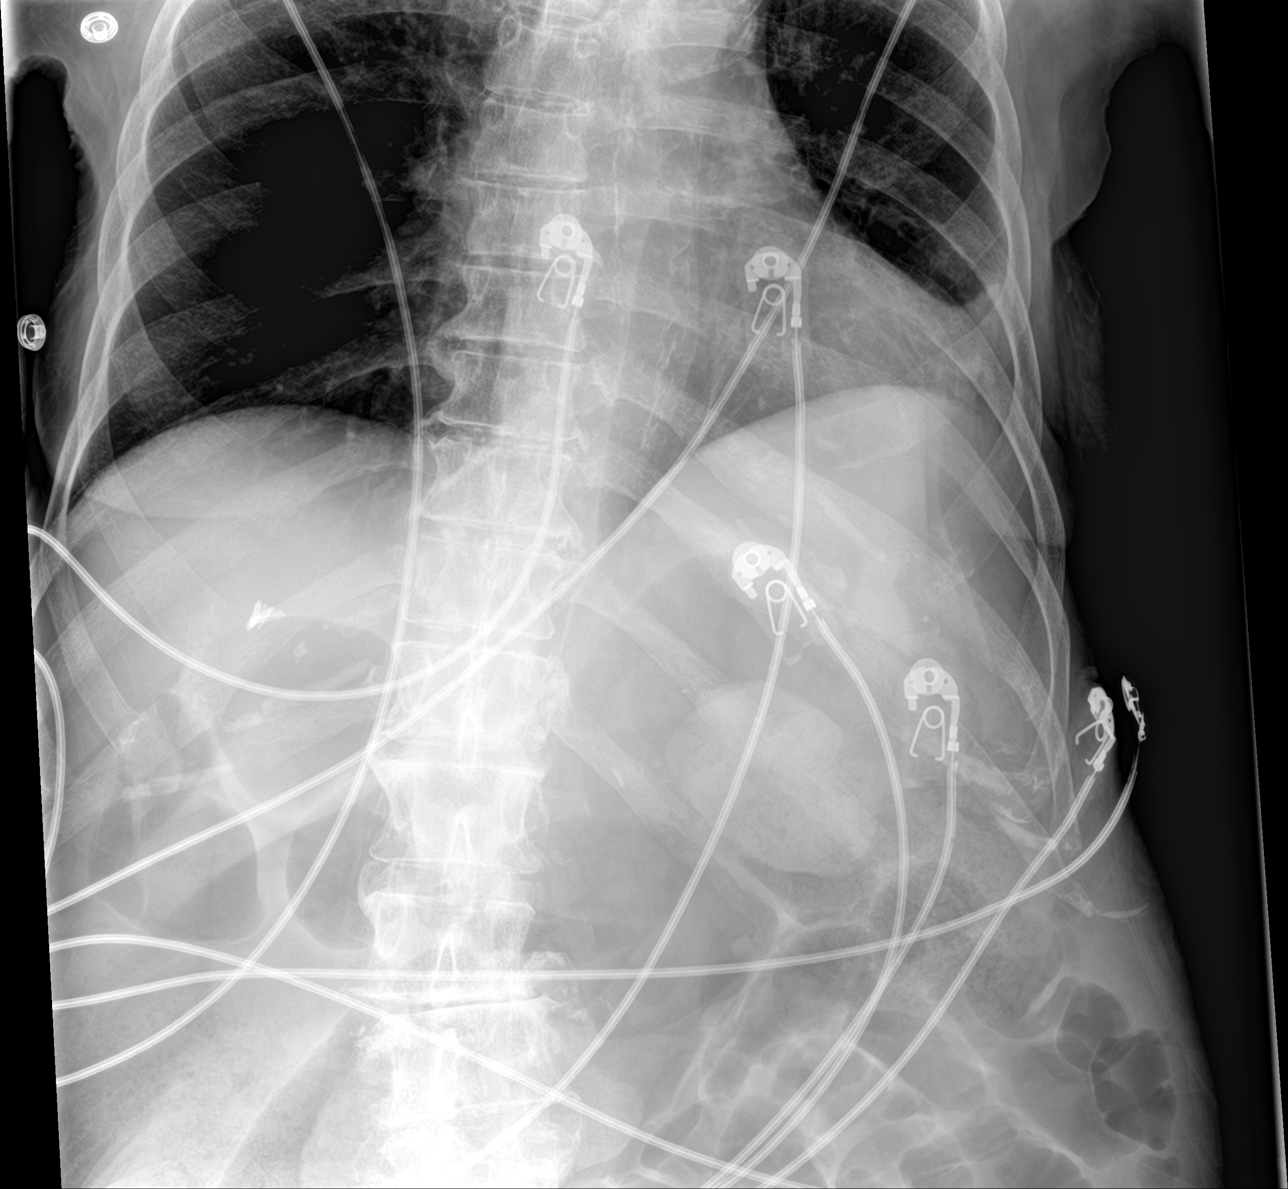

[abdomen erect]
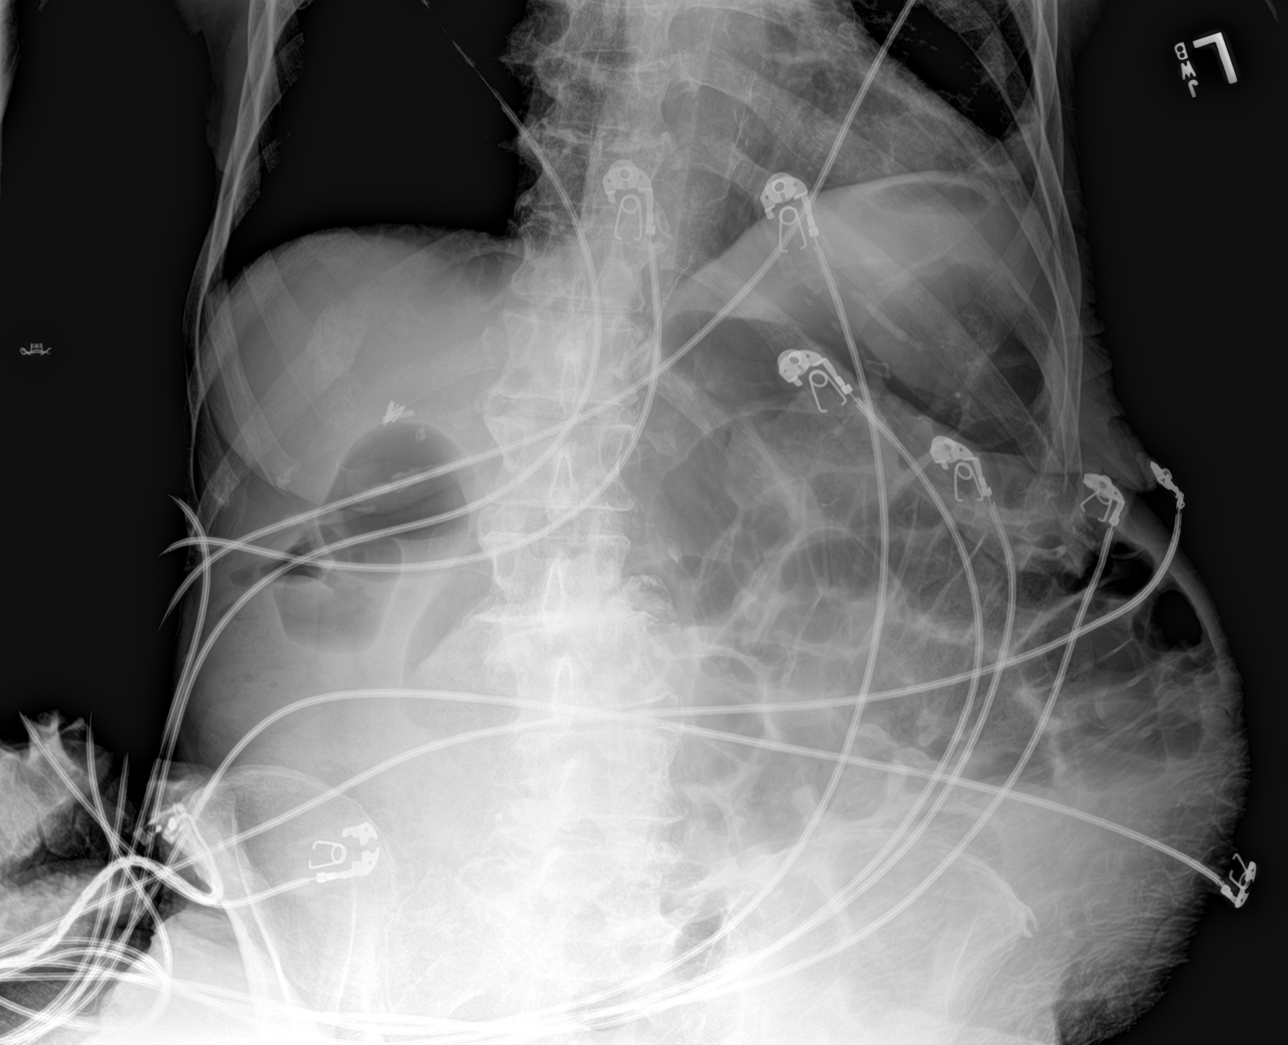

[3 of 3 positions shown; findings below may reference images not displayed]

FINDINGS: Moderate fecal loading in the colon. The distal transverse colon is
distended up to 8.7 cm. There is gas extending to the level of the
rectum. Air-filled prominent loops of small bowel are seen in the
left side of the abdomen measuring up to 4 cm. No free air, portal
venous gas, or pneumatosis. No other acute abnormalities.
IMPRESSION: 1. Mildly dilated loops of small bowel are seen in the left abdomen.
Air-filled dilated transverse colon is seen with gas extending to
the rectum. The findings could represent ileus or early obstruction.
Recommend clinical correlation and follow-up as clinically
warranted.

## 2019-07-14 IMAGING — DX DG CHEST 2V
2 series · 2 of 2 positions shown · non-contrast
Comparison: [DATE] [DATE], [DATE] [DATE]

CLINICAL DATA: Chest pain

EXAM:
CHEST  2 VIEW

[chest ap]
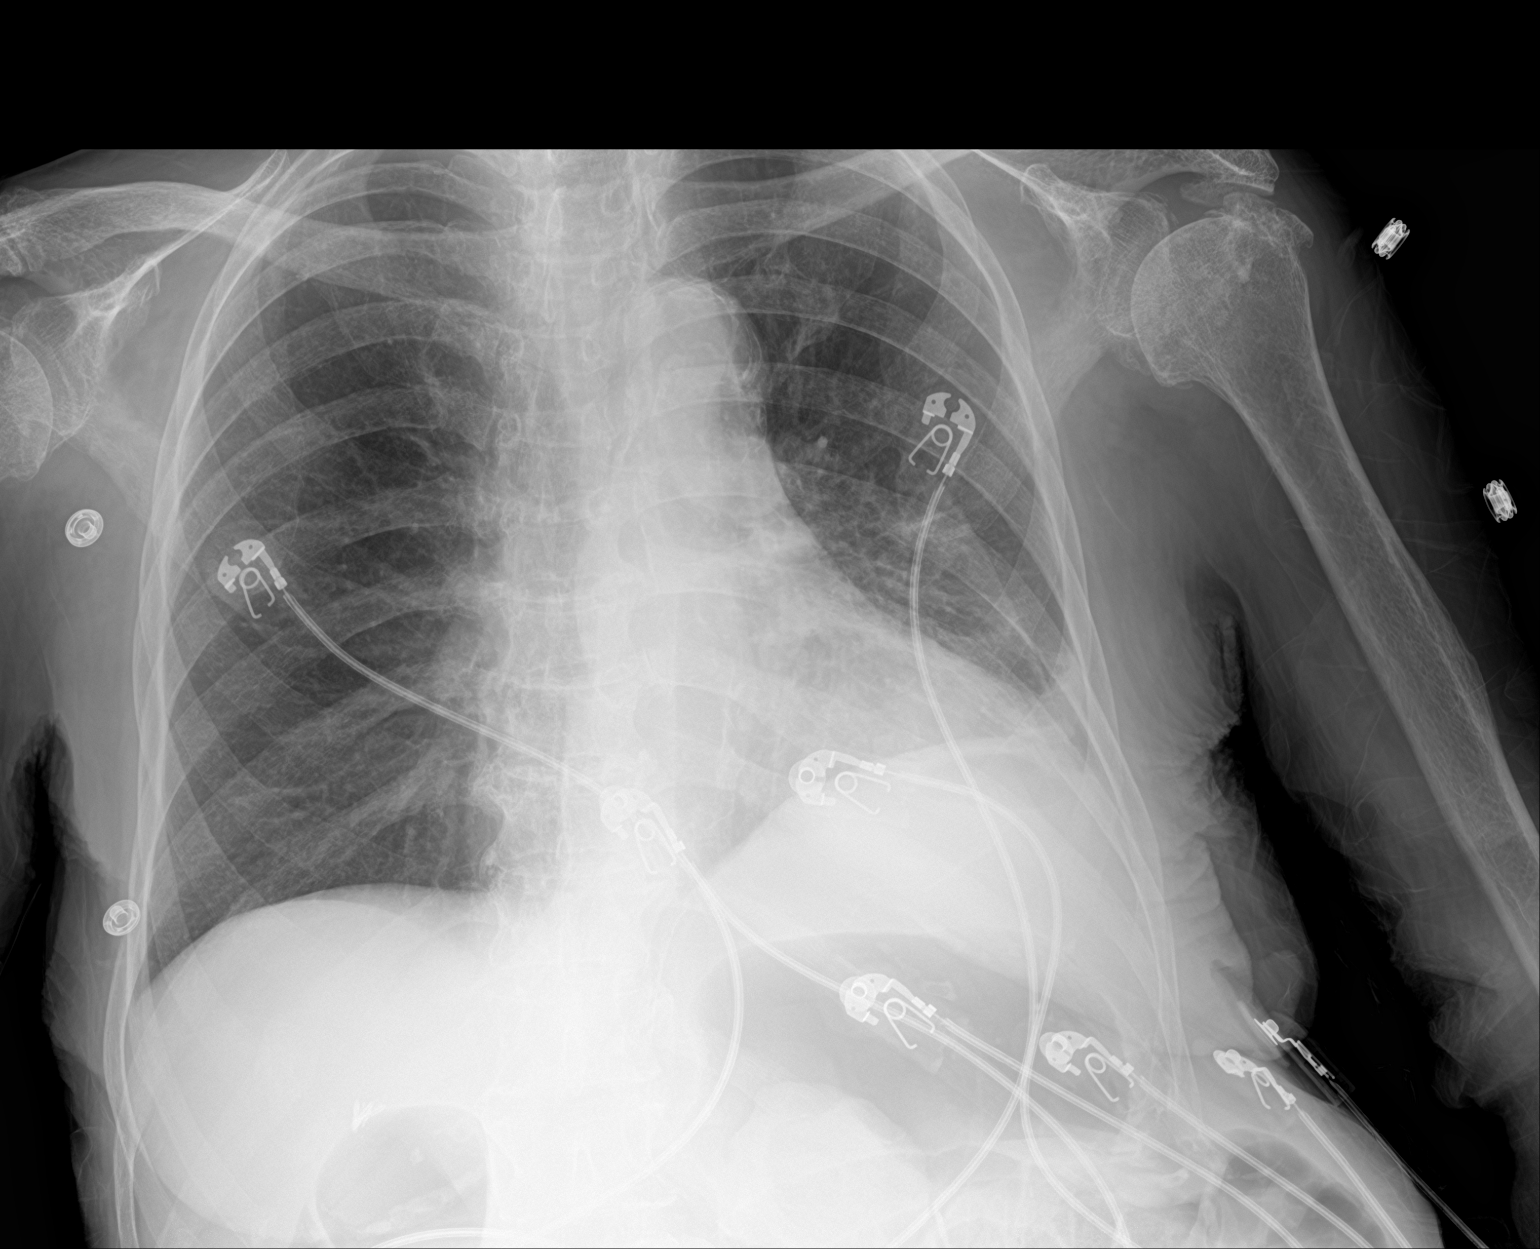

[chest lat]
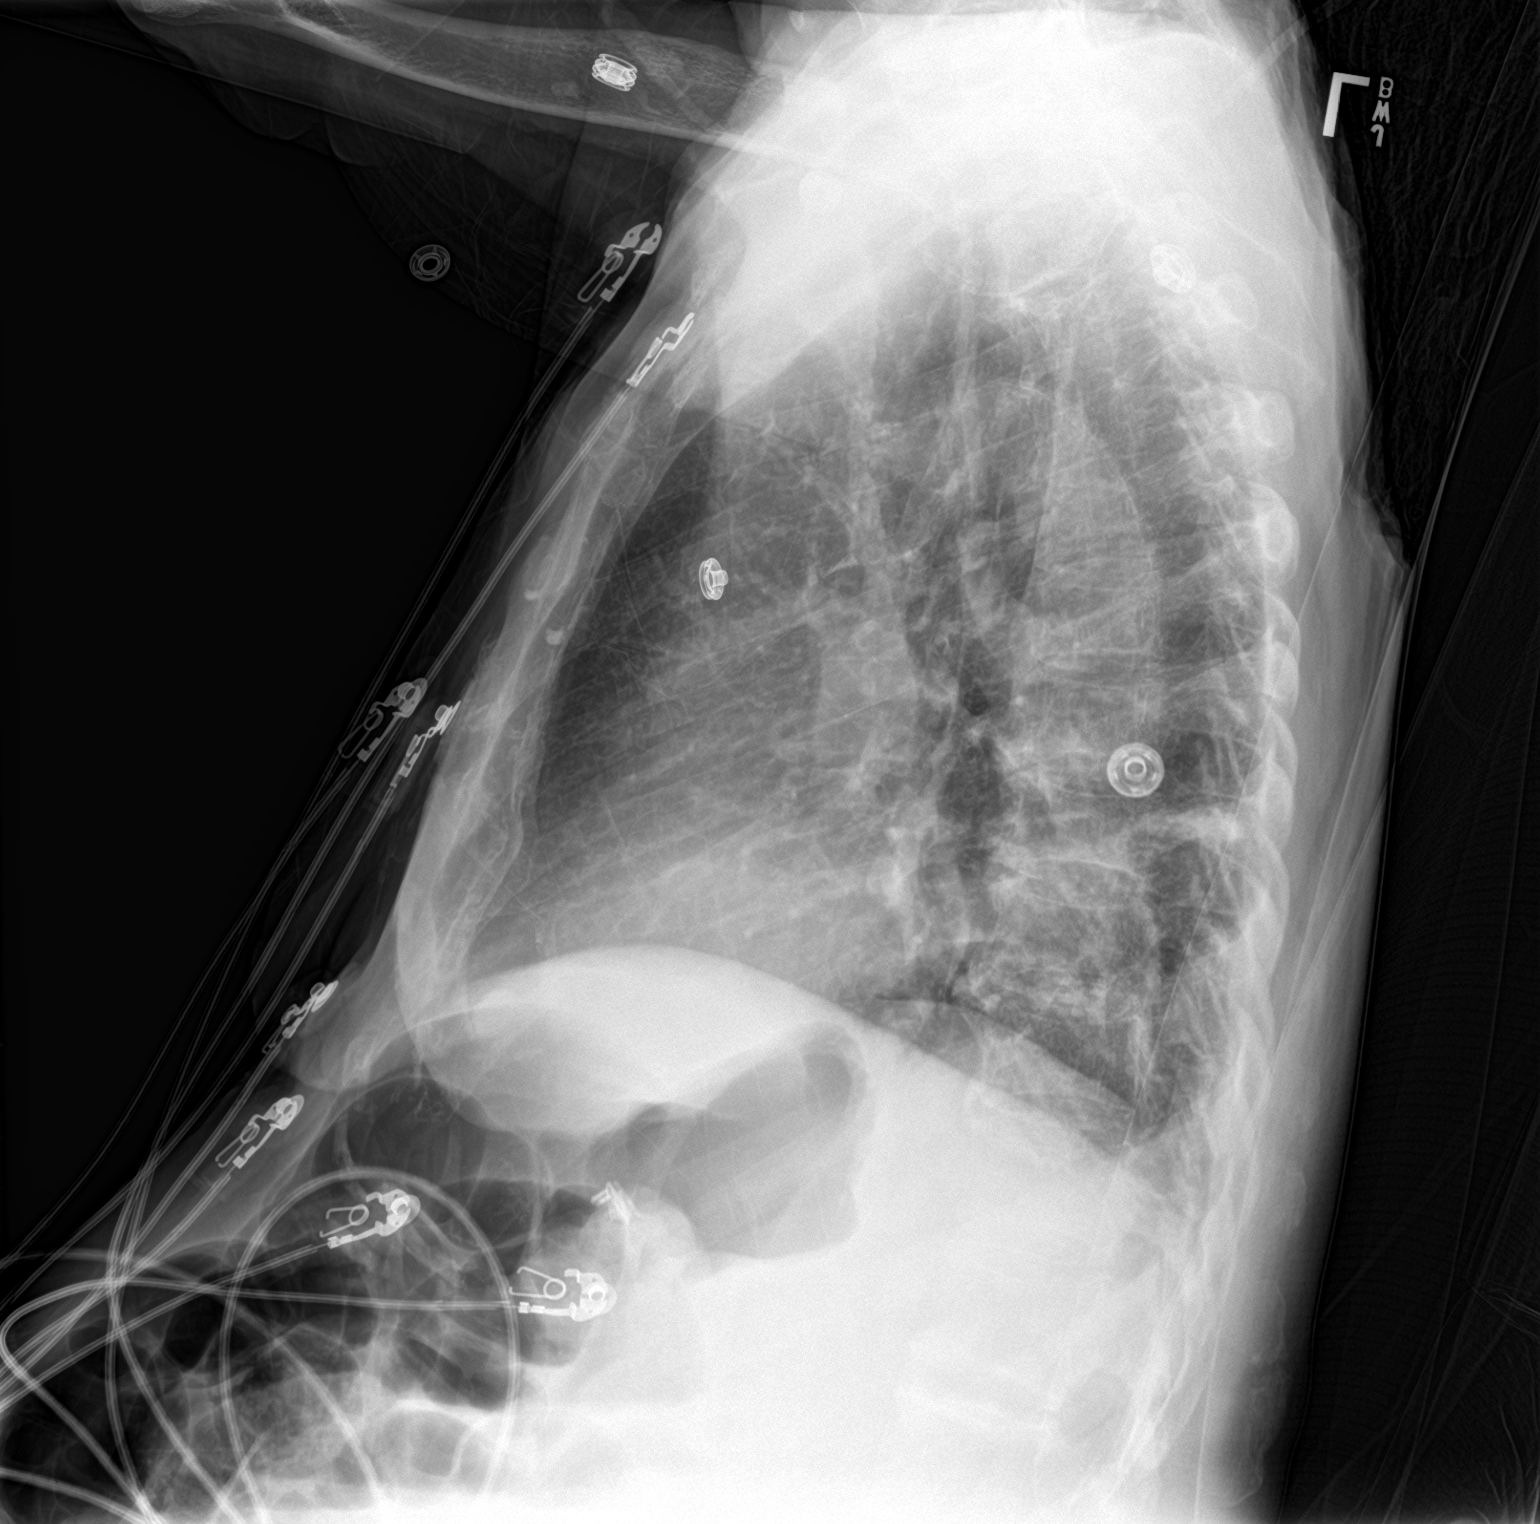

[2 of 2 positions shown; findings below may reference images not displayed]

FINDINGS: The cardiomediastinal silhouette is stable. No pneumothorax. The
right lung is clear. Mild opacity seen in the left base with
blunting of left costophrenic angle. There is opacity in this region
on the April 2017 studies.
IMPRESSION: 1. There is opacity in the left base which is improved since
April 2017. Whether this recommends scarring from a previous
infiltrate or redevelopment of a new infiltrate is unclear.
Recommend attention on follow-up. No other acute abnormalities.

## 2019-07-14 IMAGING — CT CT ABD-PELV W/O CM
2 of 4 series · 16 of 46 positions shown, 18 images · non-contrast
Comparison: Radiographs earlier this day.  CT 01/16/2017

CLINICAL DATA: Abdominal distension. Vomiting. Abdominal pain.
Hospice patient.

EXAM:
CT ABDOMEN AND PELVIS WITHOUT CONTRAST
TECHNIQUE: Multidetector CT imaging of the abdomen and pelvis was performed
following the standard protocol without IV contrast.

[Series 3: abd/ pelvis 5.0 i30f 2 · axial · 0.77mm/px · z∈[+746,+1186]mm · 13 of 96 slices shown, 15 images]
[im 4/96  soft-tissue]
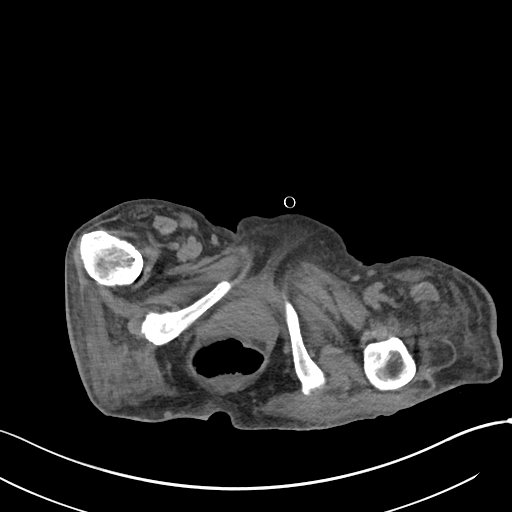
[im 4/96  bone]
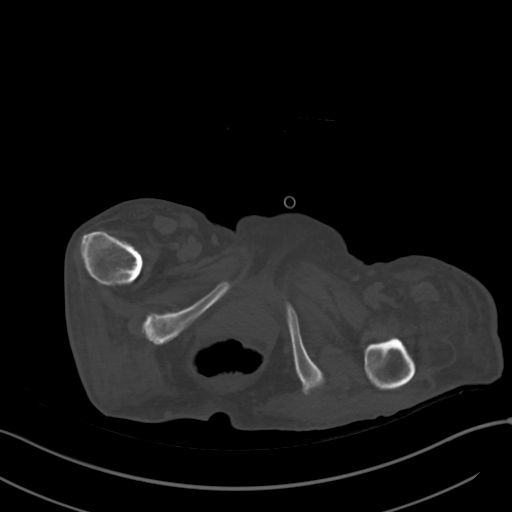
[im 12/96  soft-tissue]
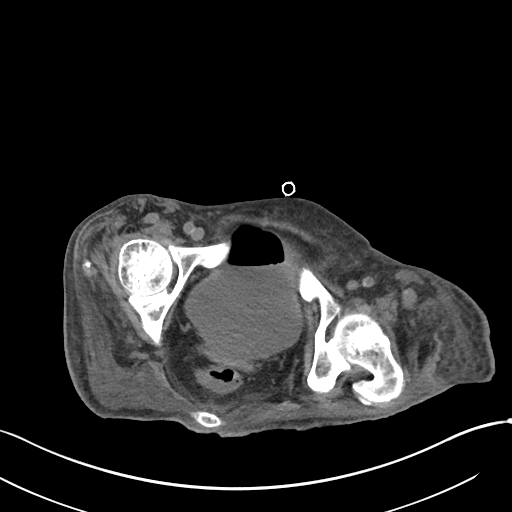
[im 20/96  soft-tissue]
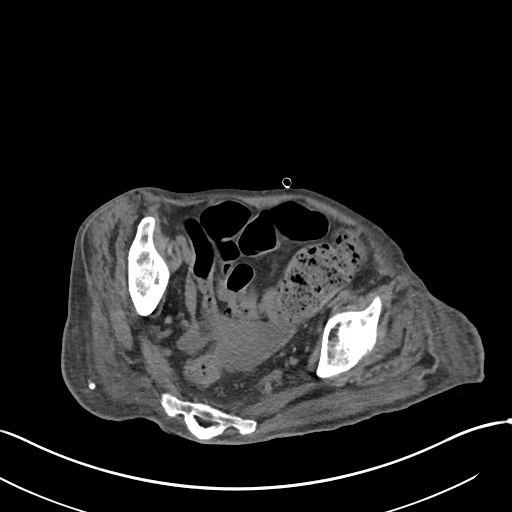
[im 27/96  soft-tissue]
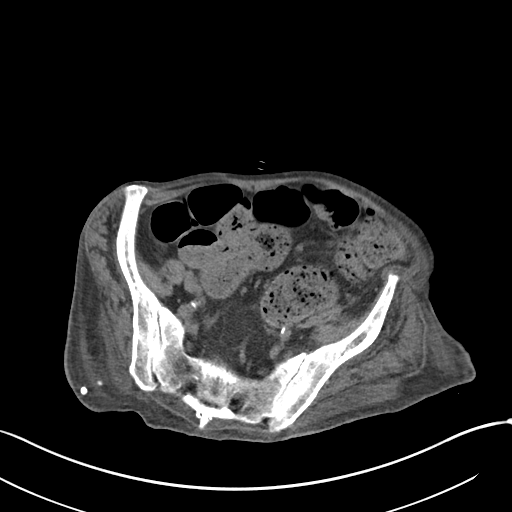
[im 35/96  soft-tissue]
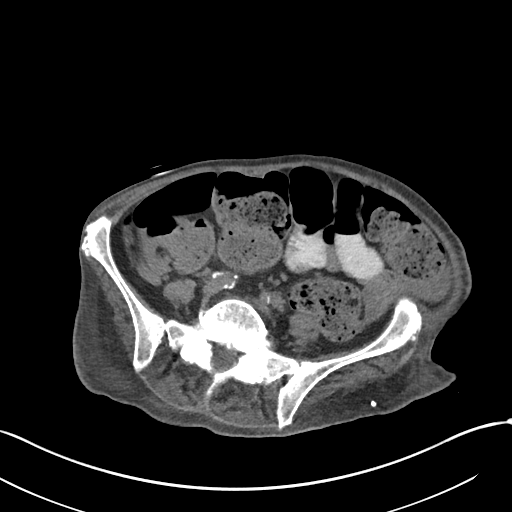
[im 42/96  soft-tissue]
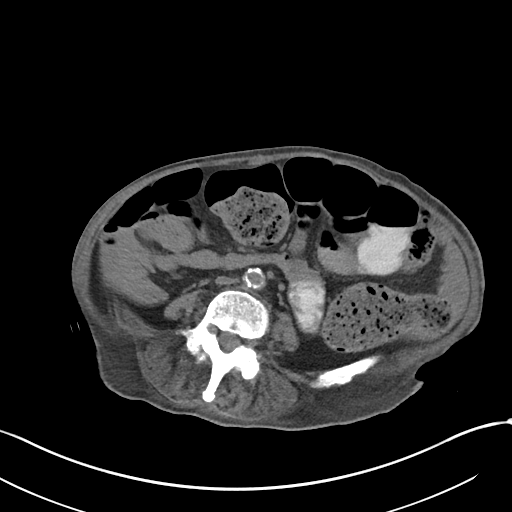
[im 50/96  soft-tissue]
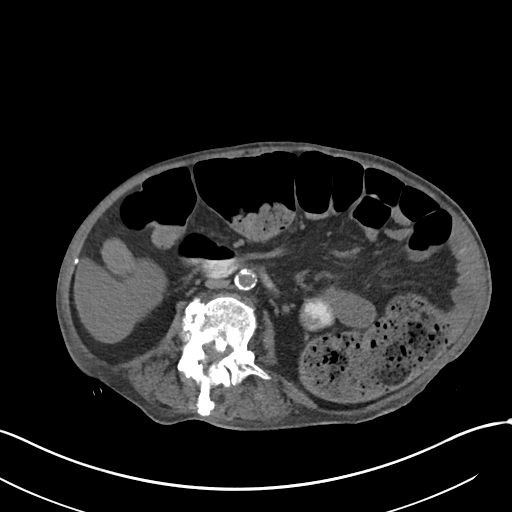
[im 54/96  soft-tissue]
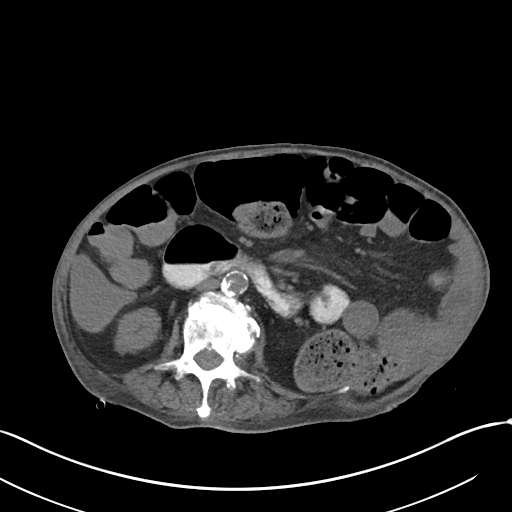
[im 61/96  soft-tissue]
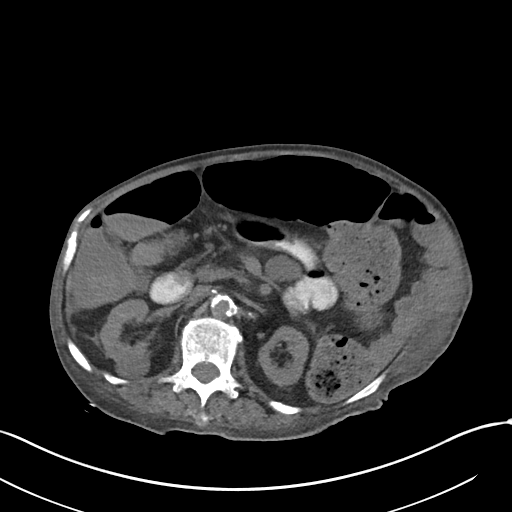
[im 61/96  bone]
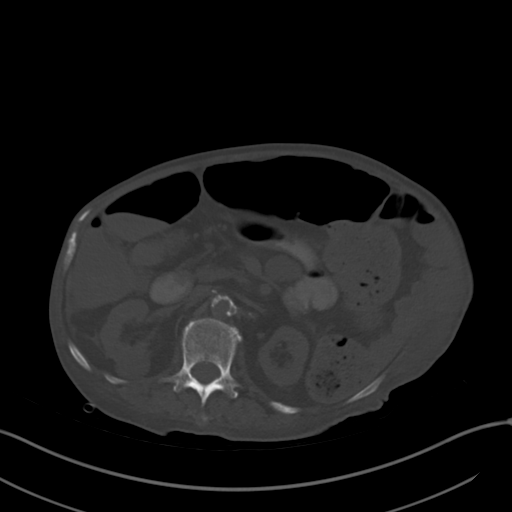
[im 69/96  soft-tissue]
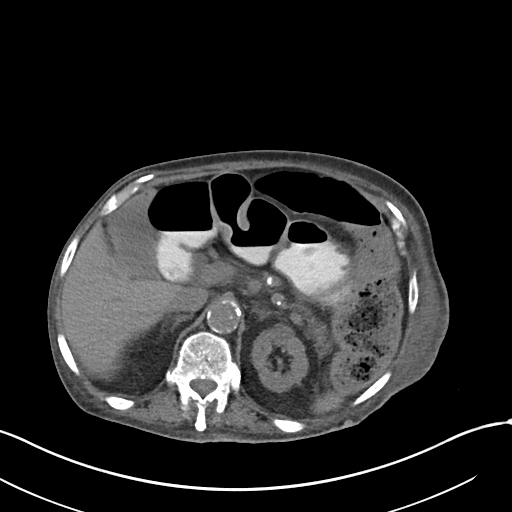
[im 77/96  soft-tissue]
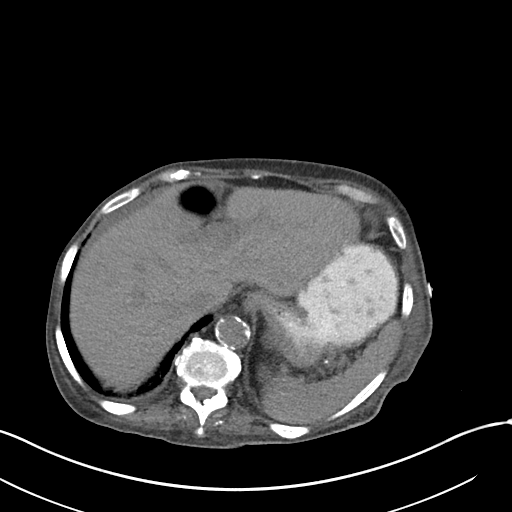
[im 84/96  soft-tissue]
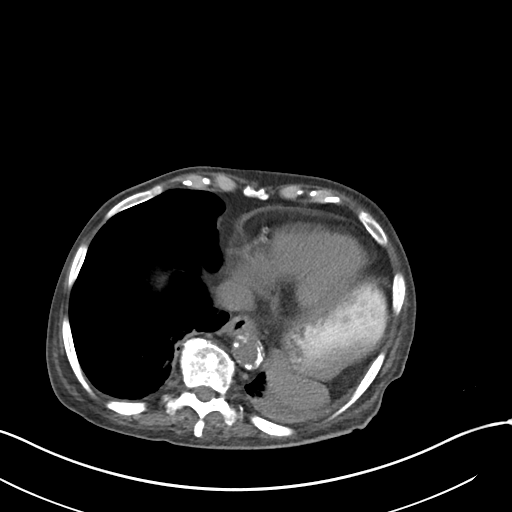
[im 92/96  soft-tissue]
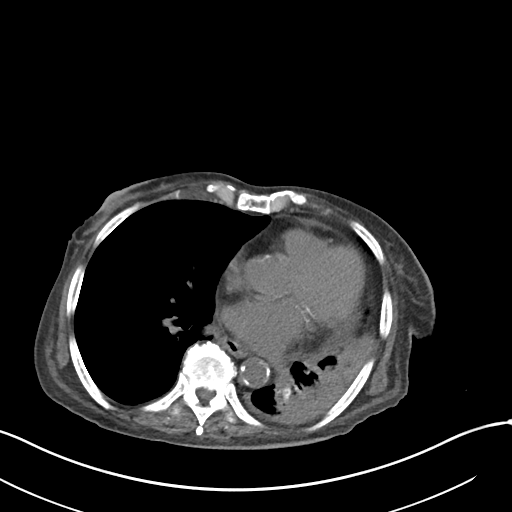

[Series 6: cor st · coronal · 0.87mm/px · 3 of 97 slices shown]
[im 33/97  soft-tissue]
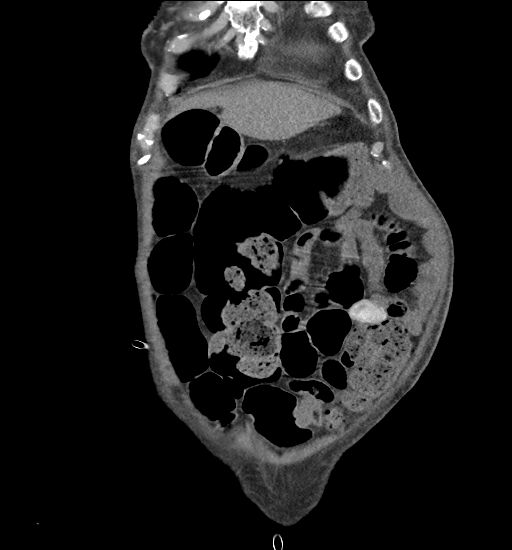
[im 43/97  soft-tissue]
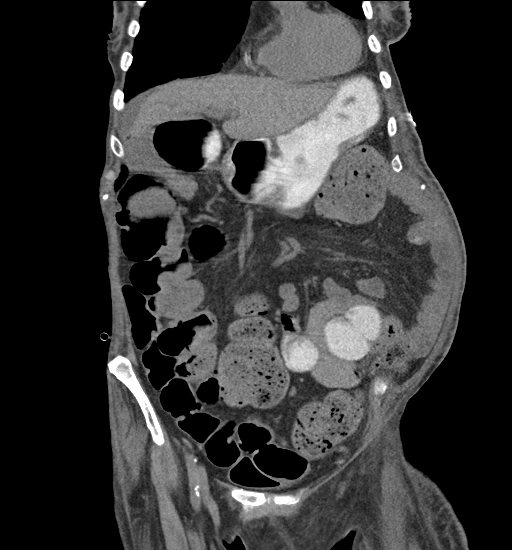
[im 54/97  soft-tissue]
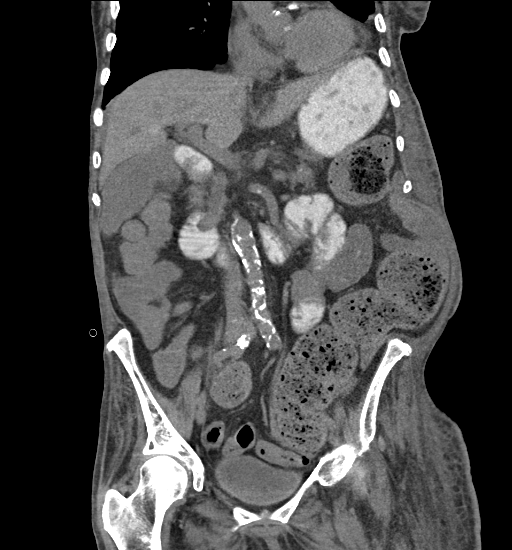

[16 of 46 positions shown; findings below may reference images not displayed]

FINDINGS: Lower chest: Left basilar consolidation with high-density material
in the lower lobe bronchi. Small left pleural effusion. Minimal
patchy opacity in the medial right lower lobe. There are coronary
artery calcifications. Small pericardial effusion.

Hepatobiliary: No focal lesion allowing for lack contrast. Clips in
the gallbladder fossa postcholecystectomy. No biliary dilatation.

Pancreas: Parenchymal atrophy. No ductal dilatation or inflammation.
Cystic lesion in the pancreatic body is unchanged measuring 2.8 x 2
cm.

Spleen: Normal in size without focal abnormality.

Adrenals/Urinary Tract: No adrenal nodule. Bilateral renal
parenchymal thinning. No hydronephrosis. Simple and hemorrhagic
cysts both kidneys are unchanged from prior exam. Urinary bladder is
distended and contains an air-fluid level. No bladder wall
thickening.

Stomach/Bowel: Chronic colonic tortuosity and moderate stool burden.
There is more liquid stool in the ascending colon. No small bowel
dilatation or obstruction. Enteric contrast reaches the mid small
bowel. No evidence of bowel inflammation.

Vascular/Lymphatic: Again seen aortic atherosclerosis. No aneurysm.
No bulky adenopathy.

Reproductive: Post hysterectomy.  No adnexal mass.

Other: Small to moderate simple free fluid in the pelvis without
organized collection. No free air. There is body wall edema.

Musculoskeletal: Multilevel degenerative change in the spine with
peripherally calcified disc extrusion at L2-L3 causing mass effect
on the spinal canal, unchanged.
IMPRESSION: 1. Colonic tortuosity with large colonic stool burden, similar to
prior CT, suggesting constipation. No bowel obstruction. No bowel
inflammation.
2. Left lower lobe consolidation with high-density material in the
lower lobe bronchi are, suspicious for aspiration, possible
aspiration of CT contrast. Patchy right lower lobe opacities, also
likely aspiration.
3. Small to moderate simple free fluid in the pelvis is likely
reactive, but nonspecific. No organized collection or abscess.
4. Cystic pancreatic mass measuring 2.8 cm is unchanged from CT 8
months prior.
5.  Aortic Atherosclerosis ([id number]-7XZ.Z).

## 2019-07-17 IMAGING — DX DG CHEST 1V PORT
1 series · 1 of 1 positions shown · non-contrast
Comparison: Radiographs August 13, 2017.

CLINICAL DATA: Cough, shortness of breath.

EXAM:
PORTABLE CHEST 1 VIEW

[chest ap]
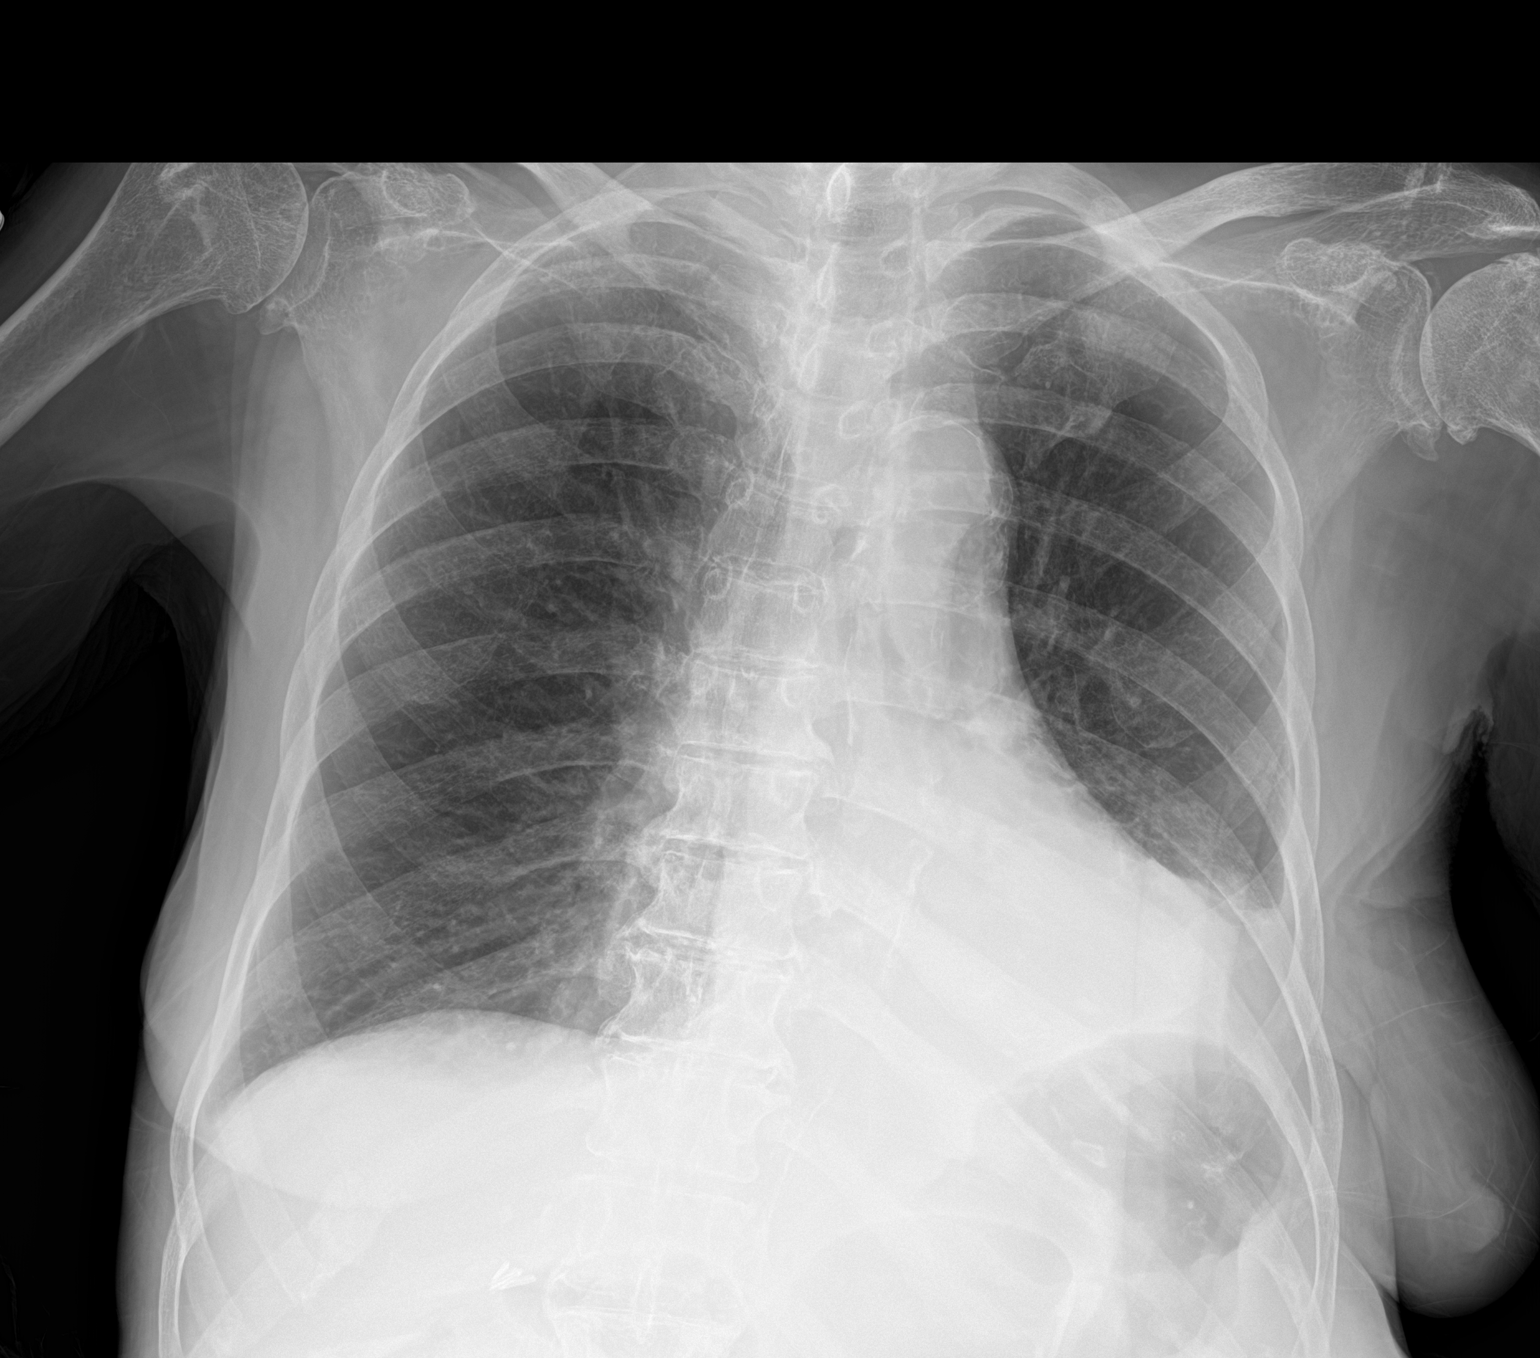

[1 of 1 positions shown; findings below may reference images not displayed]

FINDINGS: Stable cardiomediastinal silhouette. Atherosclerosis of thoracic
aorta is noted. No pneumothorax is noted. Right lung is clear.
Increased left basilar opacity is noted concerning for worsening
atelectasis or infiltrate with mild associated pleural effusion.
Bony thorax is unremarkable.
IMPRESSION: Increased left basilar opacity is noted concerning for worsening
atelectasis or infiltrate with mild associated pleural effusion.

Aortic Atherosclerosis ([id number]).

## 2019-07-20 IMAGING — DX DG ABDOMEN 2V
2 series · 2 of 2 positions shown · non-contrast
Comparison: None.

CLINICAL DATA: Abdominal pain, distention, constipation

EXAM:
ABDOMEN - 2 VIEW

[abdomen supine]
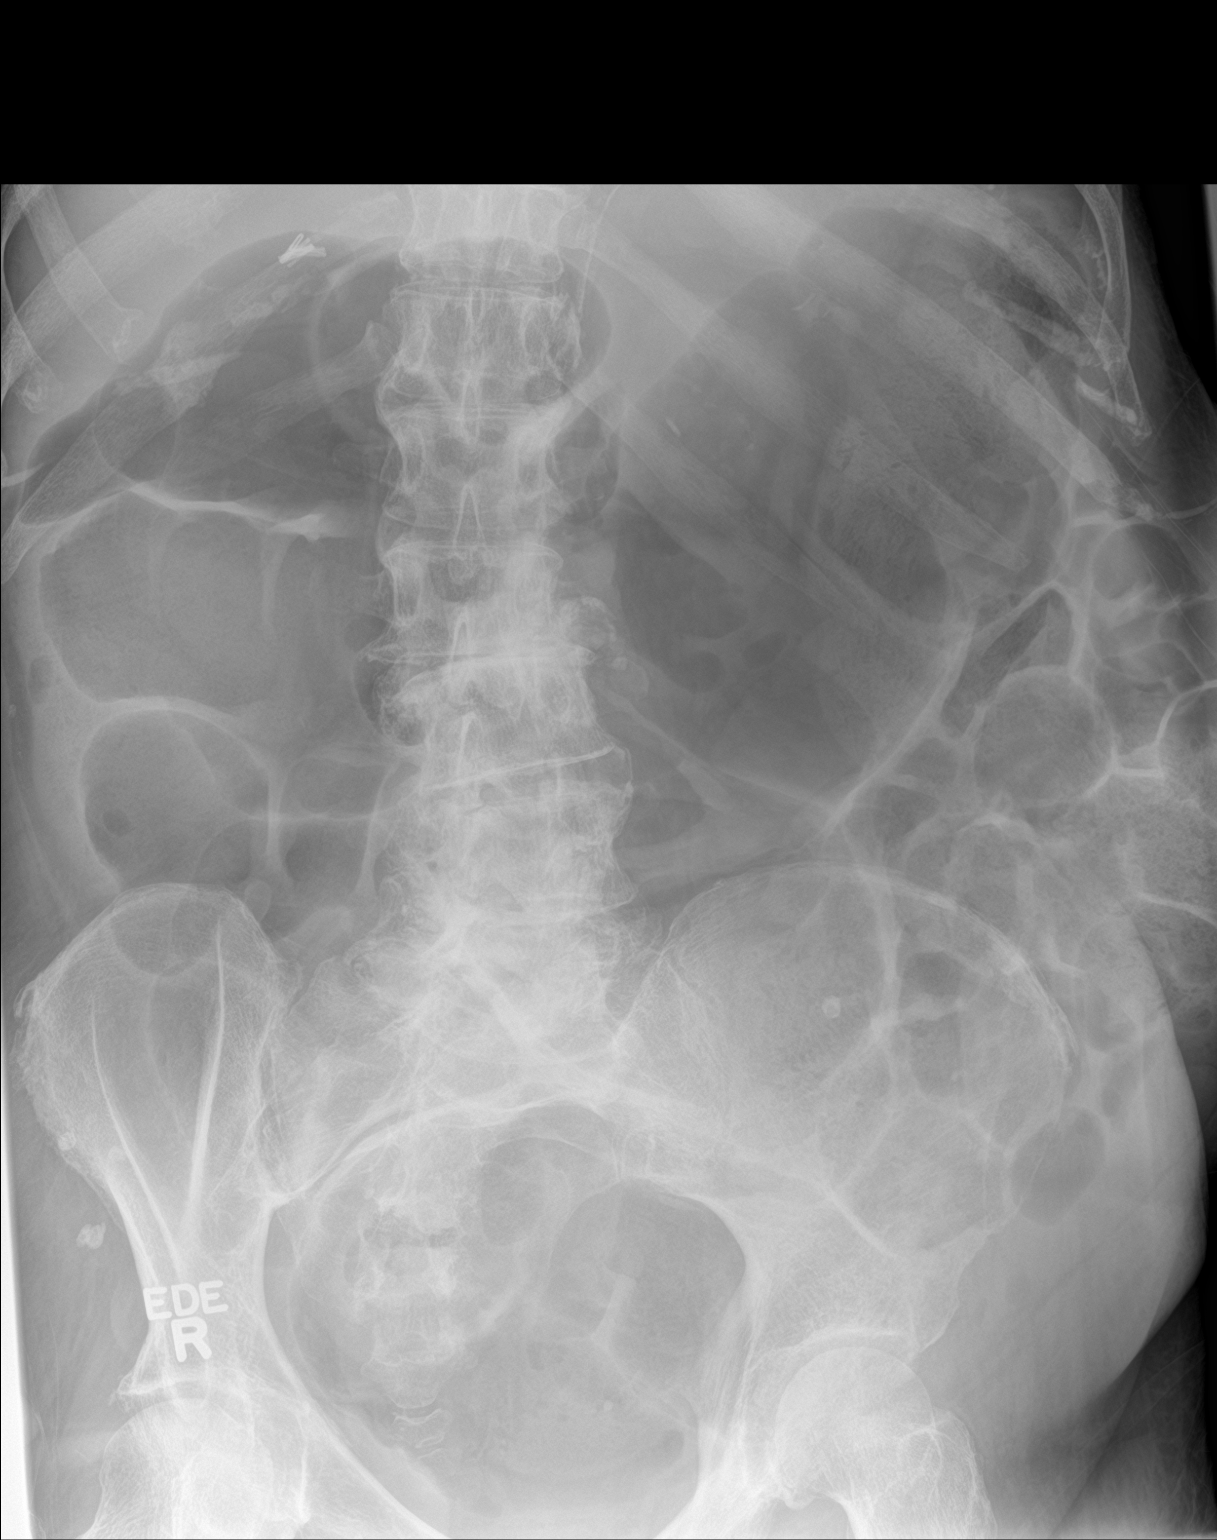

[abdomen decu]
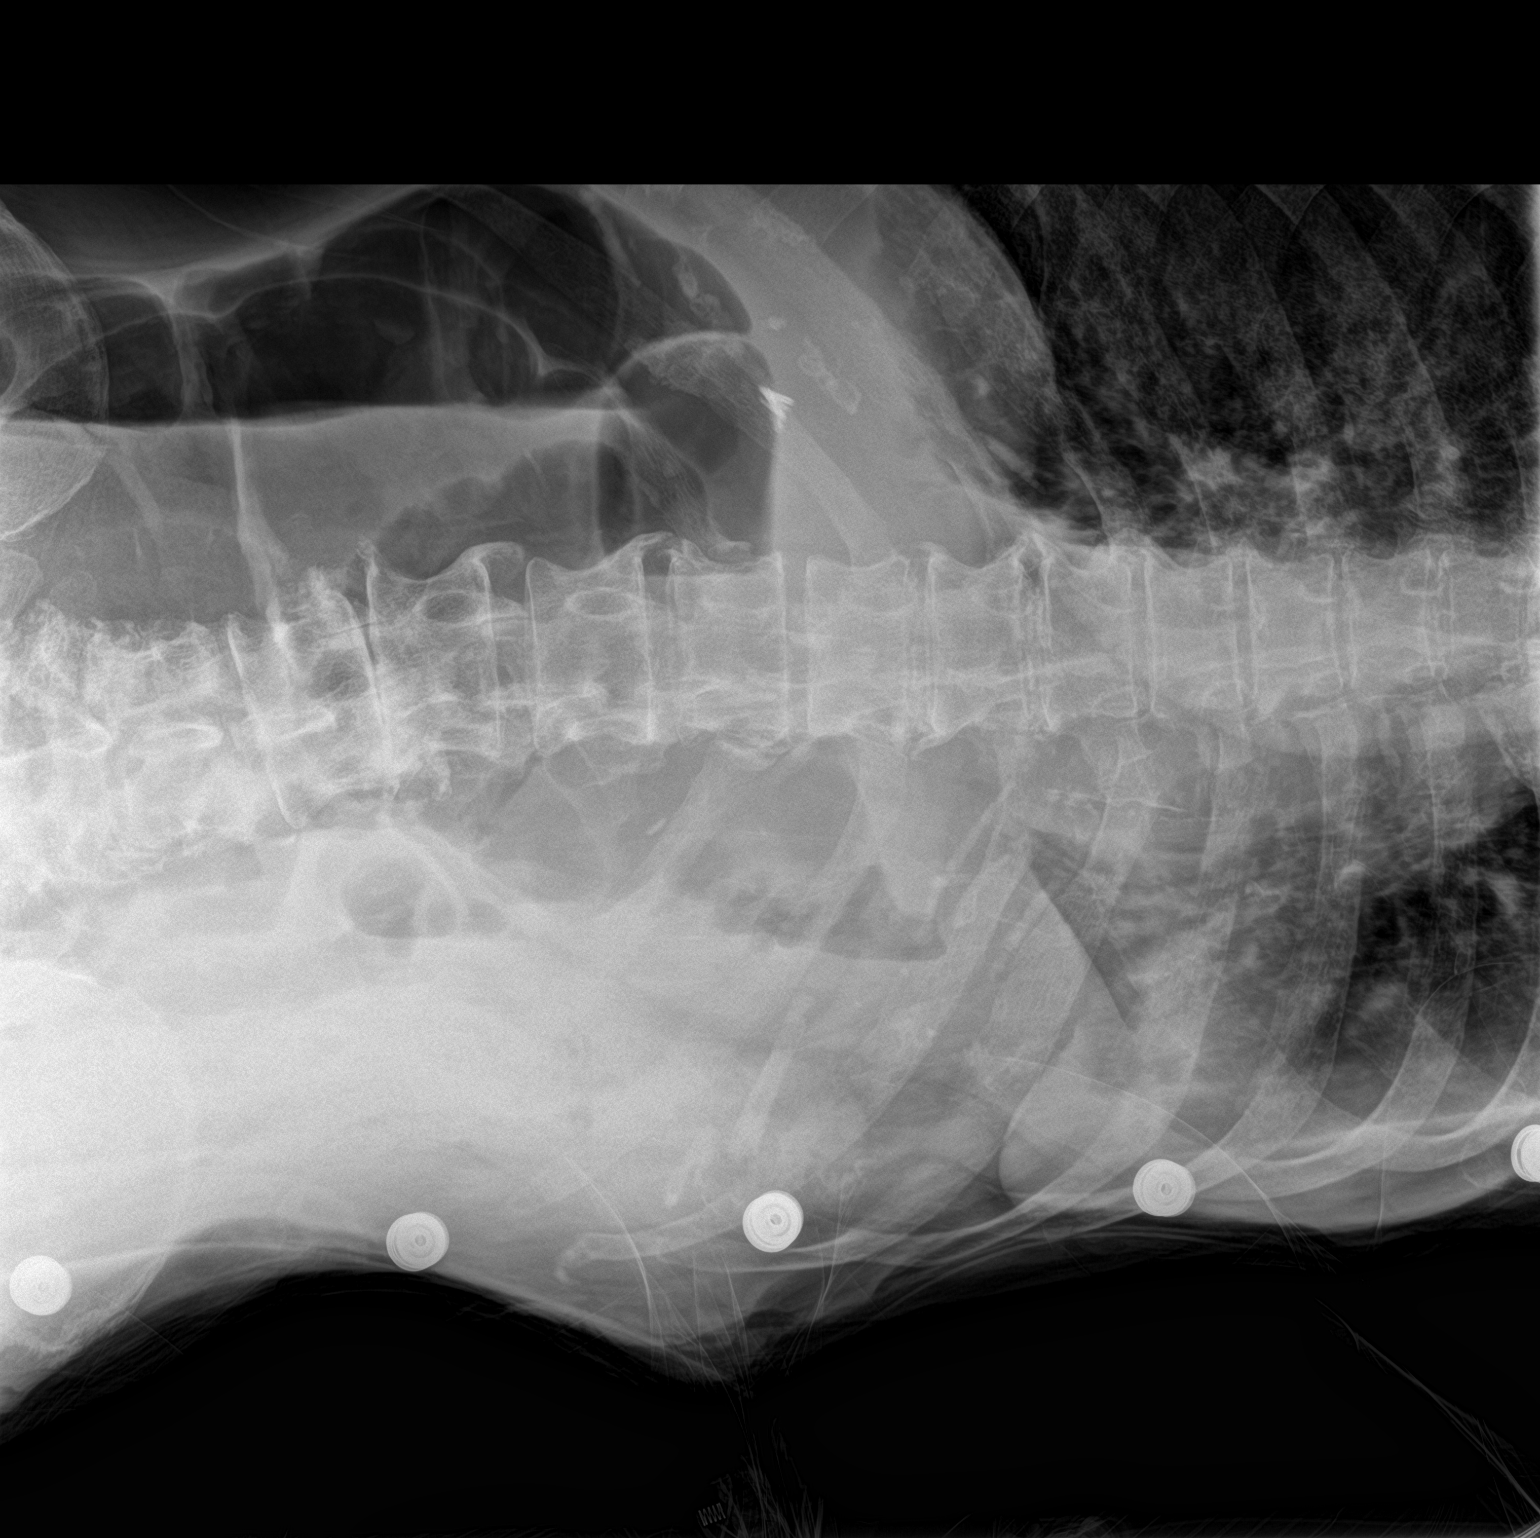

[2 of 2 positions shown; findings below may reference images not displayed]

FINDINGS: There is gaseous distention of small bowel and colon. There is no
evidence of pneumoperitoneum, portal venous gas or pneumatosis.

There are no pathologic calcifications along the expected course of
the ureters.

The osseous structures are unremarkable.
IMPRESSION: Gaseous distention of small bowel and colon concerning for an ileus.

## 2019-07-20 IMAGING — DX DG ABD PORTABLE 1V
2 series · 2 of 2 positions shown · non-contrast
Comparison: 08/19/2017

CLINICAL DATA: Encounter for nasogastric tube placement.

EXAM:
PORTABLE ABDOMEN - 1 VIEW

[abdomen kub (1 of 2)]
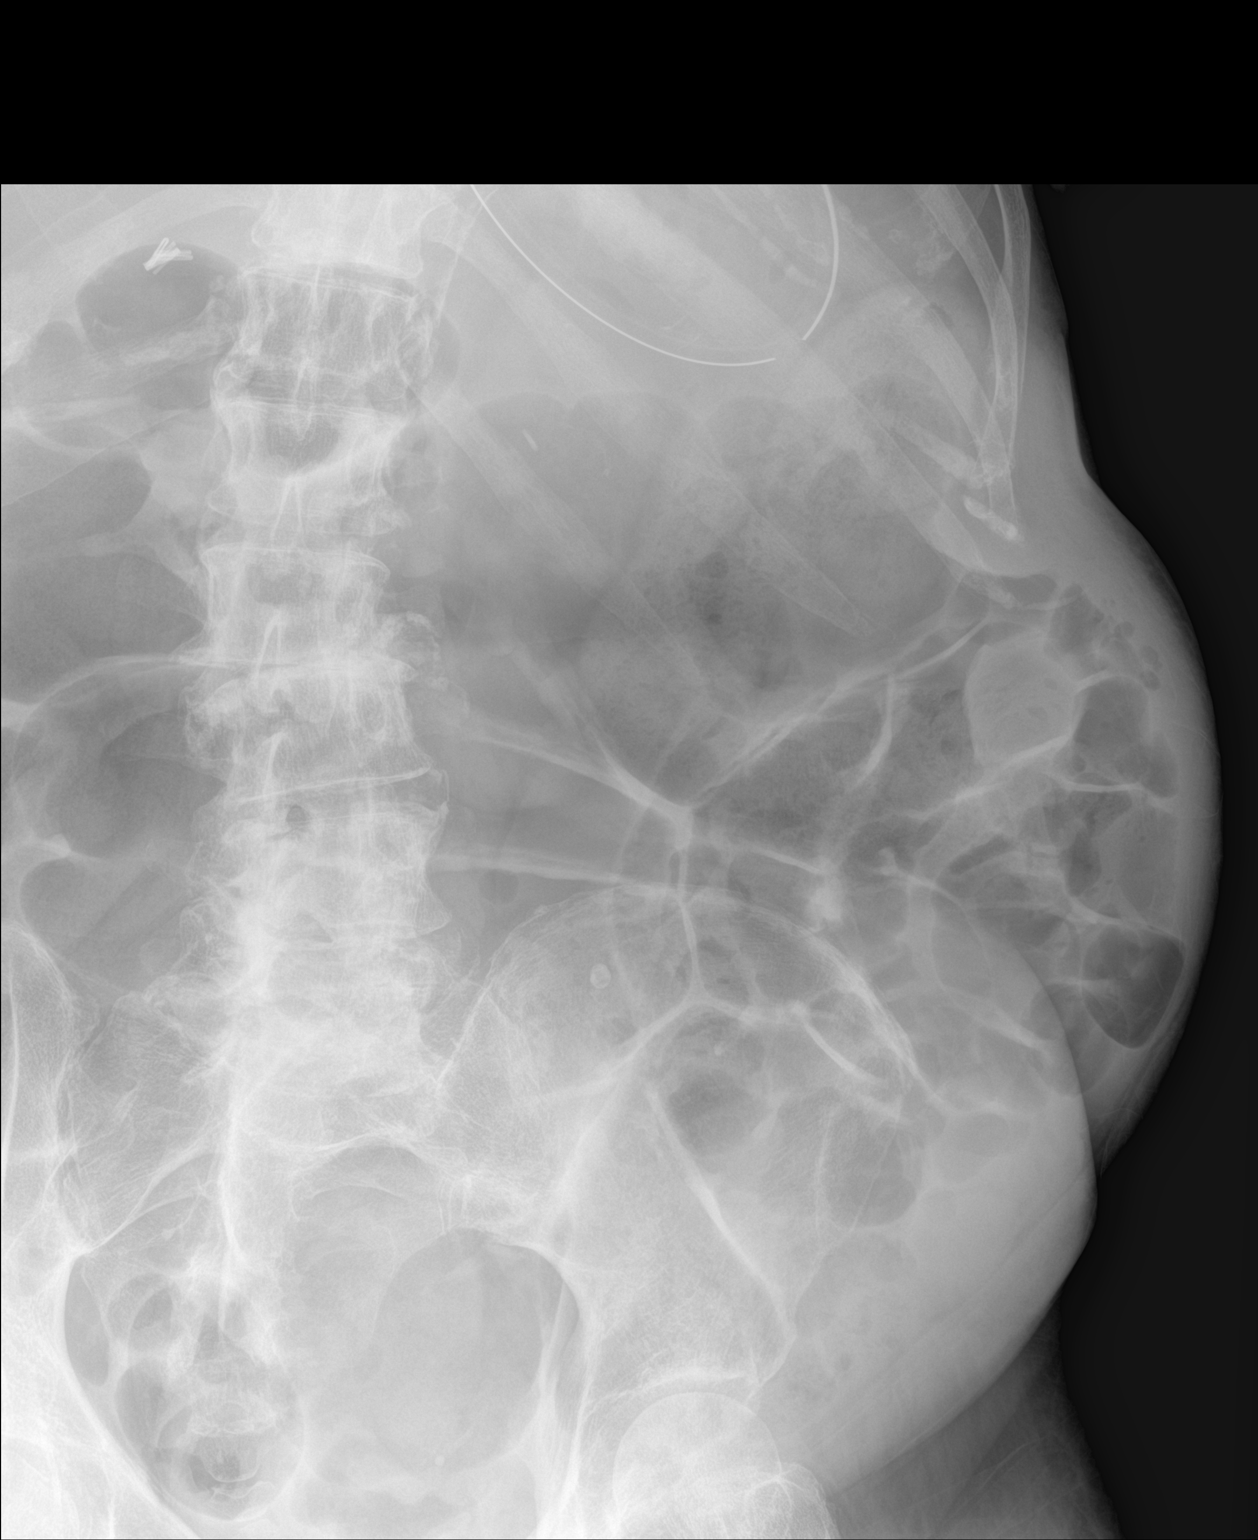

[abdomen kub (2 of 2)]
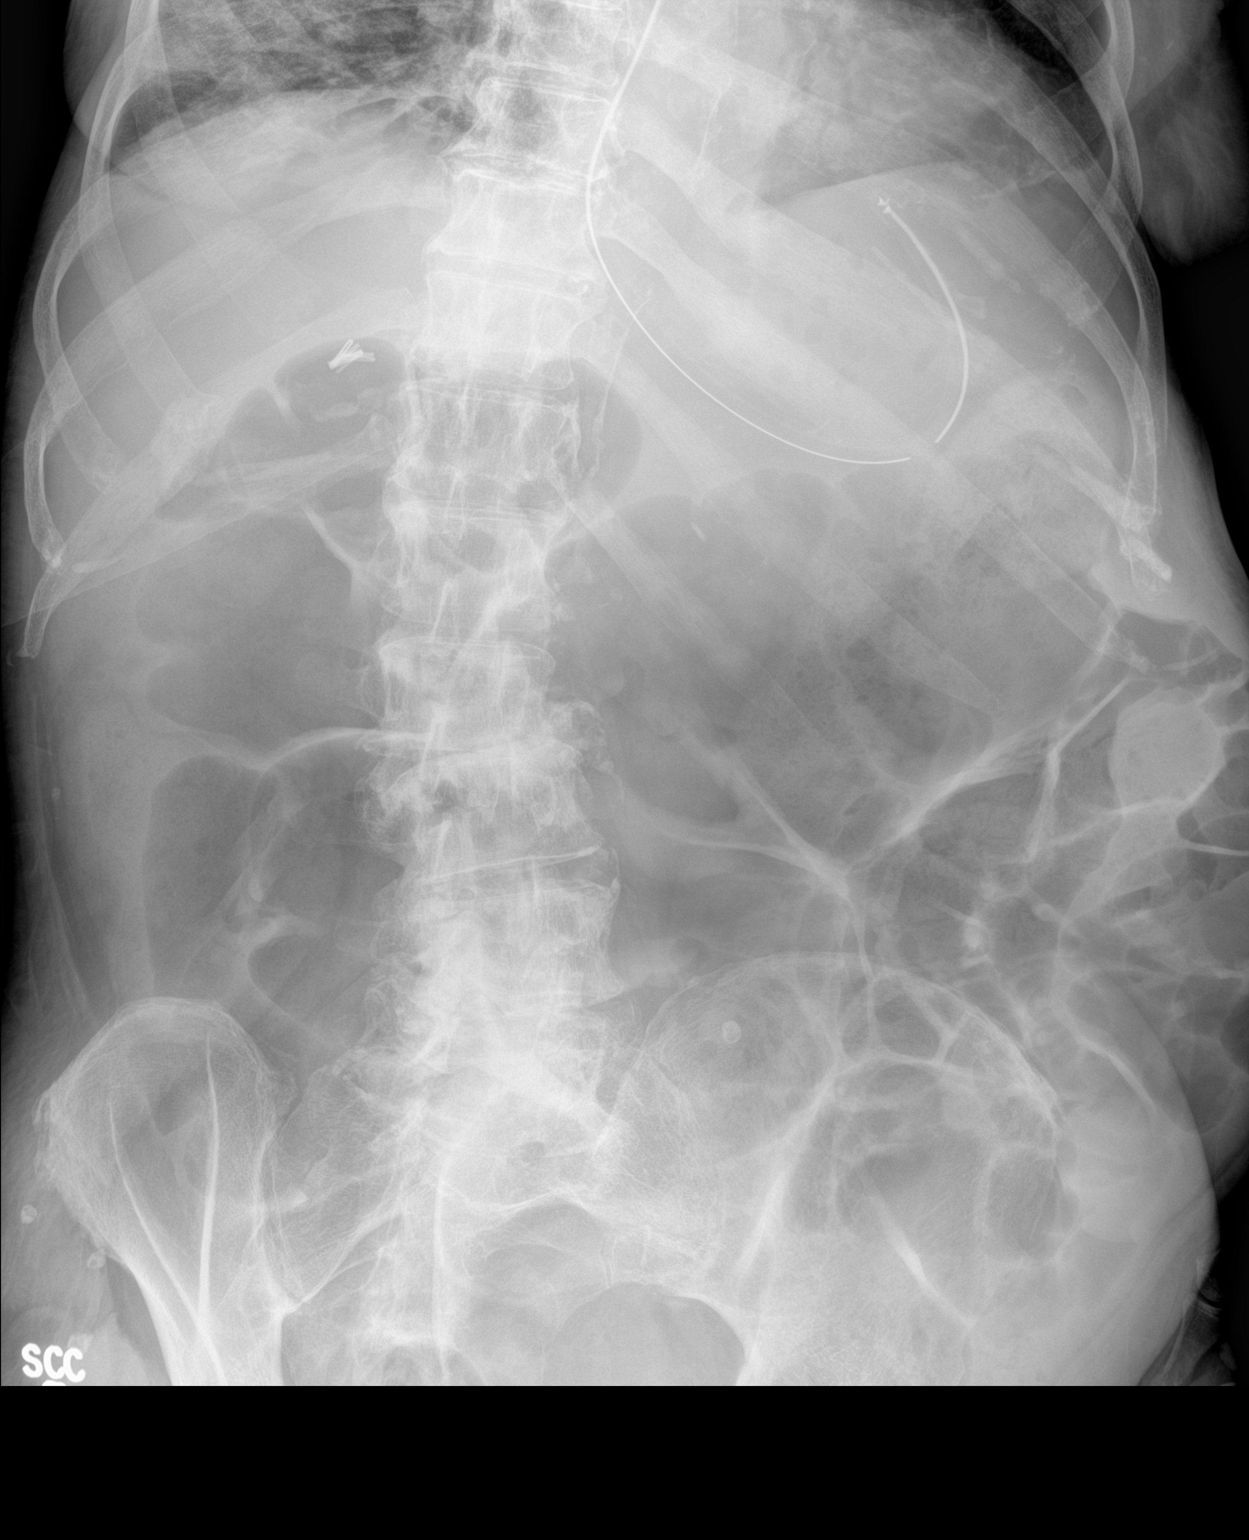

[2 of 2 positions shown; findings below may reference images not displayed]

FINDINGS: New nasogastric tube with tip and side-port over the stomach.

Diffuse gaseous distension of small and large bowel. No evidence of
pneumatosis. No visible pneumoperitoneum.

Cholecystectomy clips.
IMPRESSION: 1. Nasogastric tube tip and side-port overlaps the stomach.
2. Unchanged diffuse gaseous distension of bowel suggesting ileus.

## 2019-07-21 IMAGING — DX DG ABDOMEN 1V
2 series · 2 of 2 positions shown · non-contrast
Comparison: 08/19/2017

CLINICAL DATA: Ileus

EXAM:
ABDOMEN - 1 VIEW

[abdomen kub (1 of 2)]
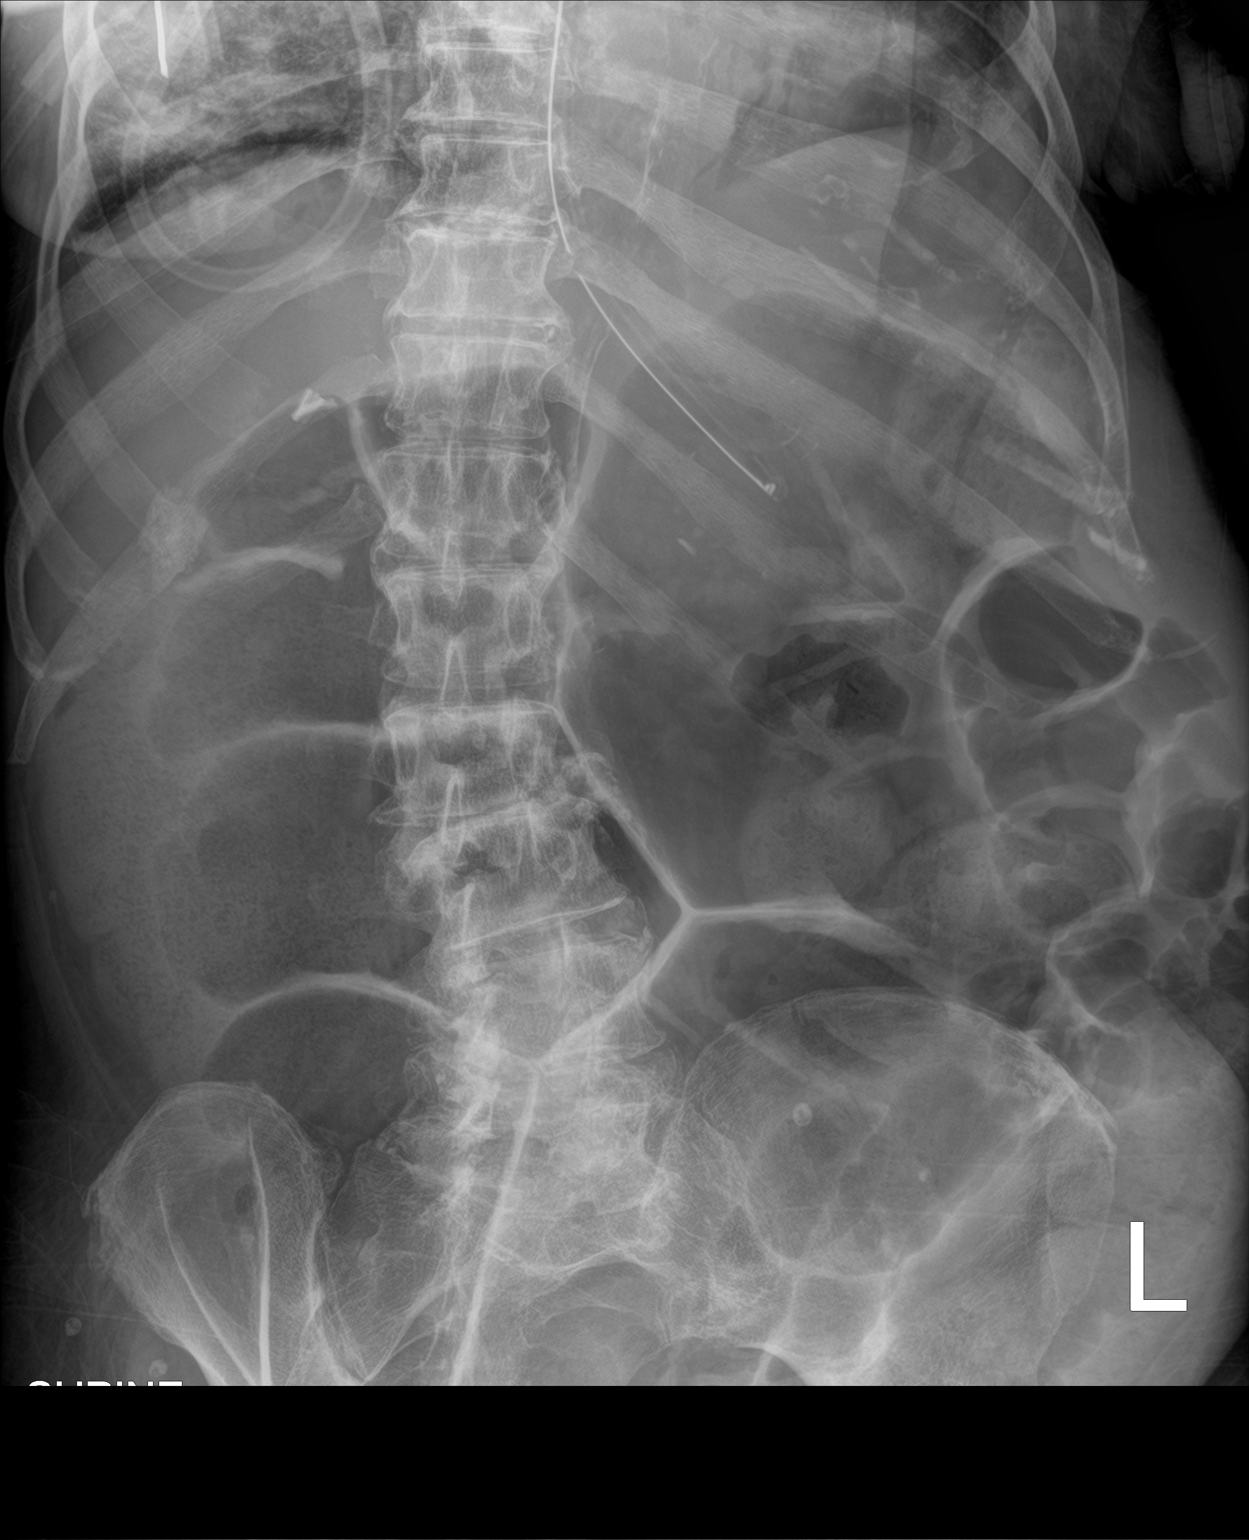

[abdomen kub (2 of 2)]
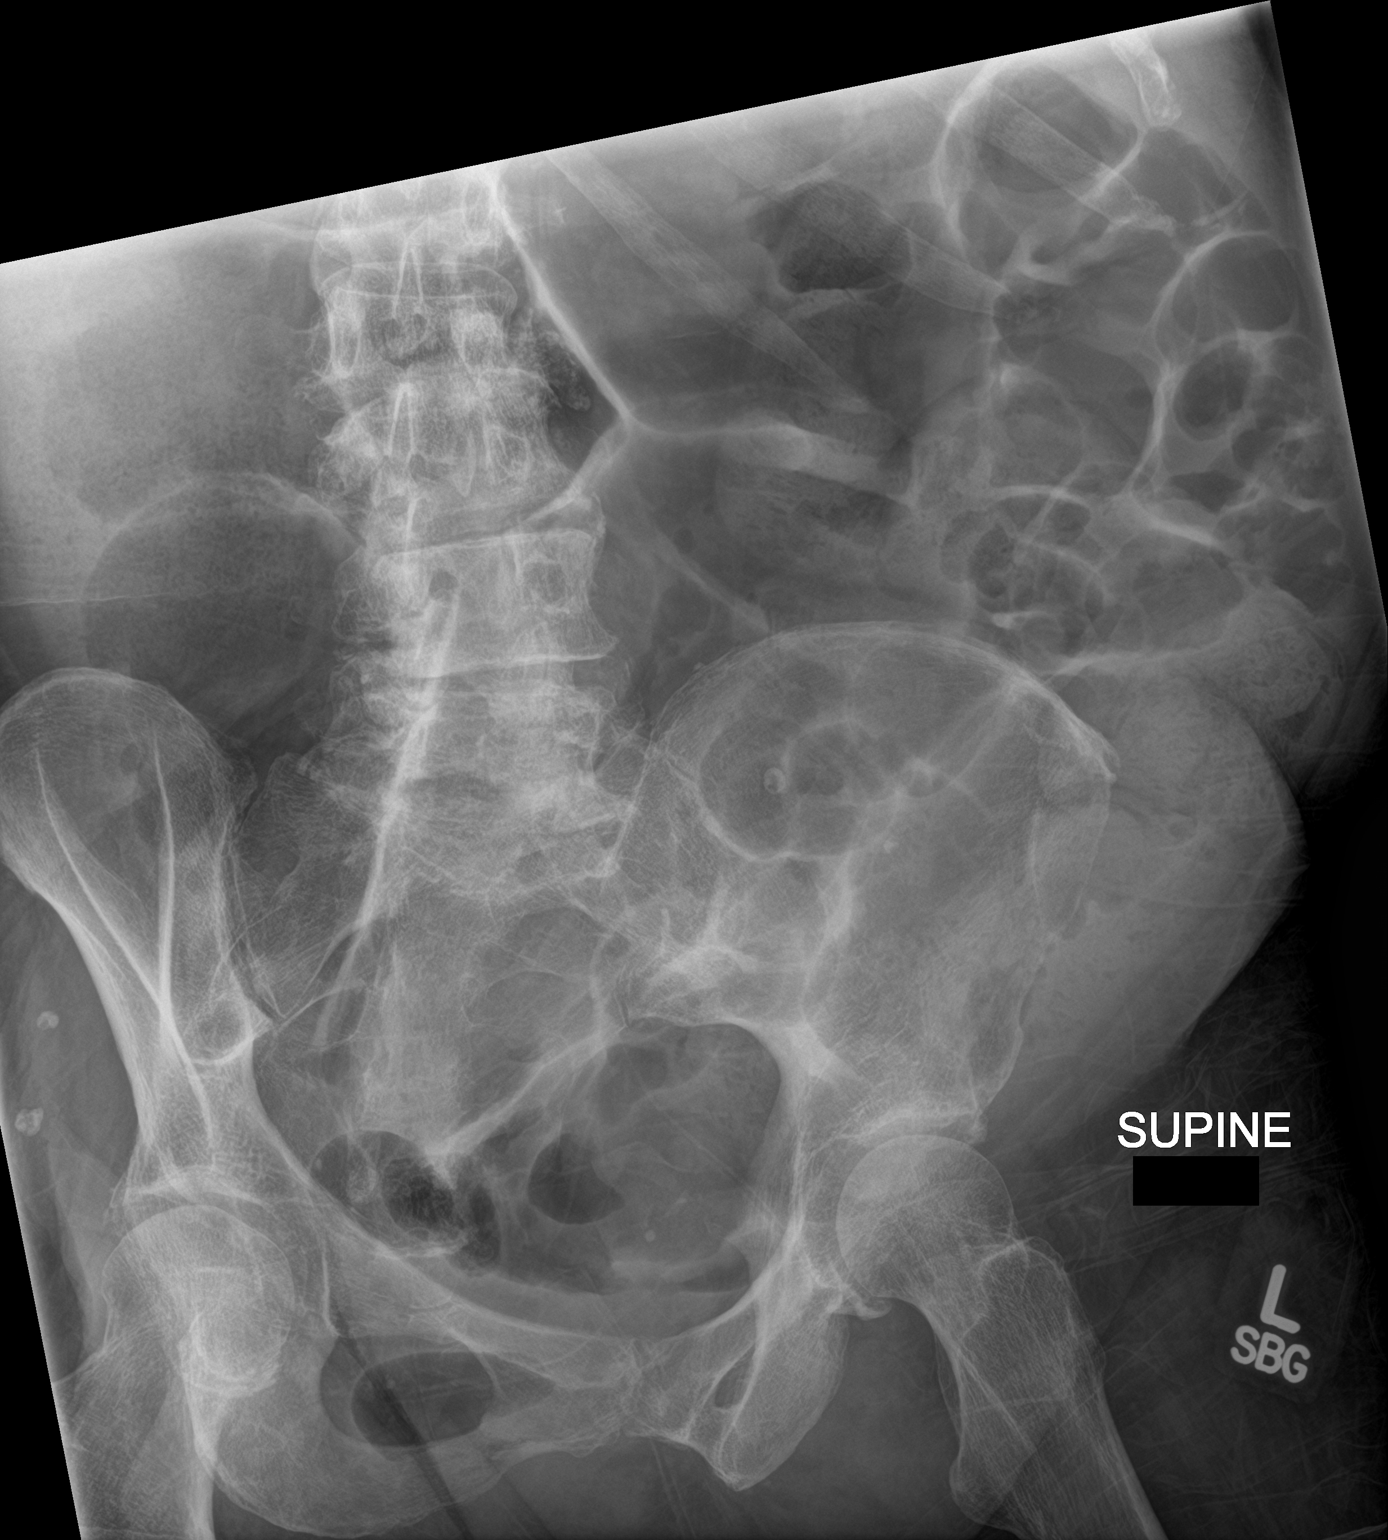

[2 of 2 positions shown; findings below may reference images not displayed]

FINDINGS: NG tube tip is in the proximal stomach. Diffuse gaseous distention
of bowel, most pronounced throughout the colon compatible with
ileus. No change since prior study. Prior cholecystectomy. No
visible organomegaly or free air.
IMPRESSION: Stable ileus pattern.
